# Patient Record
Sex: Male | Born: 1950 | Race: White | Hispanic: No | Marital: Single | Smoking: Former smoker
Health system: Southern US, Community
[De-identification: ages and names within clinical notes are randomized; demographics above are authoritative.]

## PROBLEM LIST (undated history)

## (undated) DIAGNOSIS — M1990 Unspecified osteoarthritis, unspecified site: Secondary | ICD-10-CM

## (undated) DIAGNOSIS — L409 Psoriasis, unspecified: Secondary | ICD-10-CM

## (undated) DIAGNOSIS — I2510 Atherosclerotic heart disease of native coronary artery without angina pectoris: Secondary | ICD-10-CM

## (undated) DIAGNOSIS — K449 Diaphragmatic hernia without obstruction or gangrene: Secondary | ICD-10-CM

## (undated) DIAGNOSIS — K644 Residual hemorrhoidal skin tags: Secondary | ICD-10-CM

## (undated) DIAGNOSIS — S8292XA Unspecified fracture of left lower leg, initial encounter for closed fracture: Secondary | ICD-10-CM

## (undated) DIAGNOSIS — K279 Peptic ulcer, site unspecified, unspecified as acute or chronic, without hemorrhage or perforation: Secondary | ICD-10-CM

## (undated) DIAGNOSIS — I739 Peripheral vascular disease, unspecified: Secondary | ICD-10-CM

## (undated) DIAGNOSIS — K648 Other hemorrhoids: Secondary | ICD-10-CM

## (undated) DIAGNOSIS — F419 Anxiety disorder, unspecified: Secondary | ICD-10-CM

## (undated) DIAGNOSIS — S72002A Fracture of unspecified part of neck of left femur, initial encounter for closed fracture: Secondary | ICD-10-CM

## (undated) DIAGNOSIS — M810 Age-related osteoporosis without current pathological fracture: Secondary | ICD-10-CM

## (undated) DIAGNOSIS — D649 Anemia, unspecified: Secondary | ICD-10-CM

## (undated) DIAGNOSIS — I219 Acute myocardial infarction, unspecified: Secondary | ICD-10-CM

## (undated) DIAGNOSIS — Z95810 Presence of automatic (implantable) cardiac defibrillator: Secondary | ICD-10-CM

## (undated) DIAGNOSIS — K222 Esophageal obstruction: Secondary | ICD-10-CM

## (undated) DIAGNOSIS — K219 Gastro-esophageal reflux disease without esophagitis: Secondary | ICD-10-CM

## (undated) DIAGNOSIS — I255 Ischemic cardiomyopathy: Secondary | ICD-10-CM

## (undated) DIAGNOSIS — K3189 Other diseases of stomach and duodenum: Secondary | ICD-10-CM

## (undated) DIAGNOSIS — I509 Heart failure, unspecified: Secondary | ICD-10-CM

## (undated) DIAGNOSIS — I499 Cardiac arrhythmia, unspecified: Secondary | ICD-10-CM

## (undated) DIAGNOSIS — K7460 Unspecified cirrhosis of liver: Secondary | ICD-10-CM

## (undated) DIAGNOSIS — H9190 Unspecified hearing loss, unspecified ear: Secondary | ICD-10-CM

## (undated) DIAGNOSIS — IMO0002 Reserved for concepts with insufficient information to code with codable children: Secondary | ICD-10-CM

## (undated) DIAGNOSIS — E785 Hyperlipidemia, unspecified: Secondary | ICD-10-CM

## (undated) DIAGNOSIS — N189 Chronic kidney disease, unspecified: Secondary | ICD-10-CM

## (undated) DIAGNOSIS — F32A Depression, unspecified: Secondary | ICD-10-CM

## (undated) DIAGNOSIS — K7290 Hepatic failure, unspecified without coma: Secondary | ICD-10-CM

## (undated) DIAGNOSIS — L80 Vitiligo: Secondary | ICD-10-CM

## (undated) DIAGNOSIS — M109 Gout, unspecified: Secondary | ICD-10-CM

## (undated) DIAGNOSIS — E876 Hypokalemia: Secondary | ICD-10-CM

## (undated) DIAGNOSIS — J189 Pneumonia, unspecified organism: Secondary | ICD-10-CM

## (undated) DIAGNOSIS — F79 Unspecified intellectual disabilities: Secondary | ICD-10-CM

## (undated) DIAGNOSIS — R011 Cardiac murmur, unspecified: Secondary | ICD-10-CM

## (undated) DIAGNOSIS — E871 Hypo-osmolality and hyponatremia: Secondary | ICD-10-CM

## (undated) DIAGNOSIS — D126 Benign neoplasm of colon, unspecified: Secondary | ICD-10-CM

## (undated) HISTORY — PX: CARDIAC SURGERY: SHX584

## (undated) HISTORY — DX: Peptic ulcer, site unspecified, unspecified as acute or chronic, without hemorrhage or perforation: K27.9

## (undated) HISTORY — DX: Psoriasis, unspecified: L40.9

## (undated) HISTORY — DX: Unspecified fracture of left lower leg, initial encounter for closed fracture: S82.92XA

## (undated) HISTORY — PX: FRACTURE SURGERY: SHX138

## (undated) HISTORY — DX: Diaphragmatic hernia without obstruction or gangrene: K44.9

## (undated) HISTORY — DX: Unspecified hearing loss, unspecified ear: H91.90

## (undated) HISTORY — DX: Ischemic cardiomyopathy: I25.5

## (undated) HISTORY — PX: COLONOSCOPY WITH ESOPHAGOGASTRODUODENOSCOPY (EGD): SHX5779

## (undated) HISTORY — DX: Other hemorrhoids: K64.8

## (undated) HISTORY — DX: Other diseases of stomach and duodenum: K31.89

## (undated) HISTORY — DX: Esophageal obstruction: K22.2

## (undated) HISTORY — DX: Benign neoplasm of colon, unspecified: D12.6

## (undated) HISTORY — PX: HERNIA REPAIR: SHX51

## (undated) HISTORY — DX: Reserved for concepts with insufficient information to code with codable children: IMO0002

## (undated) HISTORY — DX: Residual hemorrhoidal skin tags: K64.4

## (undated) HISTORY — DX: Vitiligo: L80
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
## 2005-06-05 ENCOUNTER — Ambulatory Visit: Payer: Self-pay | Admitting: Gastroenterology

## 2005-11-25 ENCOUNTER — Ambulatory Visit: Payer: Self-pay

## 2005-12-05 ENCOUNTER — Ambulatory Visit: Payer: Self-pay | Admitting: Internal Medicine

## 2005-12-09 ENCOUNTER — Ambulatory Visit: Payer: Self-pay | Admitting: Internal Medicine

## 2005-12-15 ENCOUNTER — Ambulatory Visit: Payer: Self-pay | Admitting: Gastroenterology

## 2007-11-29 ENCOUNTER — Ambulatory Visit: Payer: Self-pay | Admitting: Internal Medicine

## 2013-07-27 ENCOUNTER — Ambulatory Visit: Payer: Self-pay | Admitting: Podiatry

## 2013-11-21 ENCOUNTER — Encounter: Payer: Self-pay | Admitting: Podiatry

## 2013-11-21 ENCOUNTER — Ambulatory Visit (INDEPENDENT_AMBULATORY_CARE_PROVIDER_SITE_OTHER): Payer: Commercial Managed Care - HMO | Admitting: Podiatry

## 2013-11-21 VITALS — Resp 16 | Ht 72.0 in | Wt 170.0 lb

## 2013-11-21 DIAGNOSIS — B351 Tinea unguium: Secondary | ICD-10-CM

## 2013-11-21 DIAGNOSIS — M79609 Pain in unspecified limb: Secondary | ICD-10-CM | POA: Diagnosis not present

## 2013-11-21 NOTE — Progress Notes (Signed)
He presents today complaining of a painful fourth toe of the left foot. And a painful nail to the second digit of his left foot.  Objective: Vital signs are stable he is alert and oriented x3. There is no erythema edema saline is drainage or odor. Pain in limb secondary to onychomycosis second digit left foot. Painful porokeratotic lesion fifth digit left foot.  Assessment: Porokeratosis fifth left. Onychomycosis second digit left.  Plan: Debridement of both lesions. I will followup with him as needed

## 2014-01-24 ENCOUNTER — Ambulatory Visit (INDEPENDENT_AMBULATORY_CARE_PROVIDER_SITE_OTHER): Payer: Commercial Managed Care - HMO | Admitting: Podiatry

## 2014-01-24 VITALS — BP 111/69 | HR 63 | Resp 16

## 2014-01-24 DIAGNOSIS — L84 Corns and callosities: Secondary | ICD-10-CM | POA: Diagnosis not present

## 2014-01-24 NOTE — Progress Notes (Signed)
Subjective:     Patient ID: Carl [name], male   DOB: [birth year], 63 y.o.   MRN: [id number]  HPI patient points the outside left foot states the corn is very sore   Review of Systems     Objective:   Physical Exam Neurovascular status intact with severe keratotic lesion fifth digit left it's very painful when pressed    Assessment:     Corn callus formation very painful when pressed    Plan:     Debridement lesion with no iatrogenic bleeding noted and applied sterile dressing

## 2014-07-19 ENCOUNTER — Ambulatory Visit: Payer: Self-pay | Admitting: Internal Medicine

## 2014-07-26 ENCOUNTER — Inpatient Hospital Stay: Payer: Self-pay | Admitting: Internal Medicine

## 2014-07-26 LAB — BASIC METABOLIC PANEL
Anion Gap: 11 (ref 7–16)
BUN: 25 mg/dL — ABNORMAL HIGH (ref 7–18)
CO2: 25 mmol/L (ref 21–32)
CREATININE: 1.48 mg/dL — AB (ref 0.60–1.30)
Calcium, Total: 9 mg/dL (ref 8.5–10.1)
Chloride: 100 mmol/L (ref 98–107)
GFR CALC NON AF AMER: 51 — AB
Glucose: 107 mg/dL — ABNORMAL HIGH (ref 65–99)
OSMOLALITY: 277 (ref 275–301)
POTASSIUM: 4.5 mmol/L (ref 3.5–5.1)
Sodium: 136 mmol/L (ref 136–145)

## 2014-07-26 LAB — CK-MB: CK-MB: 66.1 ng/mL — AB (ref 0.5–3.6)

## 2014-07-26 LAB — PRO B NATRIURETIC PEPTIDE: B-TYPE NATIURETIC PEPTID: 18673 pg/mL — AB (ref 0–125)

## 2014-07-26 LAB — CK TOTAL AND CKMB (NOT AT ARMC)
CK, Total: 603 U/L — ABNORMAL HIGH (ref 39–308)
CK-MB: 94.3 ng/mL — ABNORMAL HIGH (ref 0.5–3.6)

## 2014-07-26 LAB — CBC
HCT: 47.4 % (ref 40.0–52.0)
HGB: 15.8 g/dL (ref 13.0–18.0)
MCH: 32.8 pg (ref 26.0–34.0)
MCHC: 33.4 g/dL (ref 32.0–36.0)
MCV: 98 fL (ref 80–100)
Platelet: 186 10*3/uL (ref 150–440)
RBC: 4.84 10*6/uL (ref 4.40–5.90)
RDW: 14.1 % (ref 11.5–14.5)
WBC: 8.1 10*3/uL (ref 3.8–10.6)

## 2014-07-26 LAB — TROPONIN I
TROPONIN-I: 24.2 ng/mL — AB
Troponin-I: 21 ng/mL — ABNORMAL HIGH

## 2014-07-26 LAB — PROTIME-INR
INR: 1.1
PROTHROMBIN TIME: 14.2 s (ref 11.5–14.7)

## 2014-07-26 LAB — APTT: ACTIVATED PTT: 31.5 s (ref 23.6–35.9)

## 2014-07-27 ENCOUNTER — Encounter: Payer: Self-pay | Admitting: Cardiovascular Disease

## 2014-07-27 ENCOUNTER — Inpatient Hospital Stay (HOSPITAL_COMMUNITY)
Admission: AD | Admit: 2014-07-27 | Discharge: 2014-08-17 | DRG: 235 | Disposition: A | Discharge status: Skilled Nursing Facility | Payer: Medicare HMO | Source: Other Acute Inpatient Hospital | Attending: Surgery | Admitting: Surgery

## 2014-07-27 ENCOUNTER — Encounter (HOSPITAL_COMMUNITY): Payer: Self-pay | Admitting: Family Medicine

## 2014-07-27 DIAGNOSIS — Z681 Body mass index (BMI) 19 or less, adult: Secondary | ICD-10-CM | POA: Diagnosis not present

## 2014-07-27 DIAGNOSIS — I2109 ST elevation (STEMI) myocardial infarction involving other coronary artery of anterior wall: Principal | ICD-10-CM

## 2014-07-27 DIAGNOSIS — I25110 Atherosclerotic heart disease of native coronary artery with unstable angina pectoris: Secondary | ICD-10-CM | POA: Diagnosis present

## 2014-07-27 DIAGNOSIS — Z79899 Other long term (current) drug therapy: Secondary | ICD-10-CM

## 2014-07-27 DIAGNOSIS — N183 Chronic kidney disease, stage 3 (moderate): Secondary | ICD-10-CM | POA: Diagnosis present

## 2014-07-27 DIAGNOSIS — F17210 Nicotine dependence, cigarettes, uncomplicated: Secondary | ICD-10-CM | POA: Diagnosis present

## 2014-07-27 DIAGNOSIS — Z7982 Long term (current) use of aspirin: Secondary | ICD-10-CM | POA: Diagnosis not present

## 2014-07-27 DIAGNOSIS — I2510 Atherosclerotic heart disease of native coronary artery without angina pectoris: Secondary | ICD-10-CM

## 2014-07-27 DIAGNOSIS — N179 Acute kidney failure, unspecified: Secondary | ICD-10-CM | POA: Diagnosis not present

## 2014-07-27 DIAGNOSIS — Z8249 Family history of ischemic heart disease and other diseases of the circulatory system: Secondary | ICD-10-CM | POA: Diagnosis not present

## 2014-07-27 DIAGNOSIS — Z951 Presence of aortocoronary bypass graft: Secondary | ICD-10-CM

## 2014-07-27 DIAGNOSIS — I340 Nonrheumatic mitral (valve) insufficiency: Secondary | ICD-10-CM

## 2014-07-27 DIAGNOSIS — F819 Developmental disorder of scholastic skills, unspecified: Secondary | ICD-10-CM | POA: Diagnosis present

## 2014-07-27 DIAGNOSIS — I213 ST elevation (STEMI) myocardial infarction of unspecified site: Secondary | ICD-10-CM

## 2014-07-27 DIAGNOSIS — I509 Heart failure, unspecified: Secondary | ICD-10-CM

## 2014-07-27 DIAGNOSIS — E43 Unspecified severe protein-calorie malnutrition: Secondary | ICD-10-CM | POA: Diagnosis present

## 2014-07-27 DIAGNOSIS — F70 Mild intellectual disabilities: Secondary | ICD-10-CM | POA: Diagnosis present

## 2014-07-27 DIAGNOSIS — I200 Unstable angina: Secondary | ICD-10-CM | POA: Diagnosis present

## 2014-07-27 DIAGNOSIS — I255 Ischemic cardiomyopathy: Secondary | ICD-10-CM | POA: Diagnosis present

## 2014-07-27 DIAGNOSIS — I5023 Acute on chronic systolic (congestive) heart failure: Secondary | ICD-10-CM | POA: Diagnosis present

## 2014-07-27 DIAGNOSIS — R570 Cardiogenic shock: Secondary | ICD-10-CM | POA: Diagnosis present

## 2014-07-27 DIAGNOSIS — K5900 Constipation, unspecified: Secondary | ICD-10-CM | POA: Diagnosis present

## 2014-07-27 DIAGNOSIS — I5021 Acute systolic (congestive) heart failure: Secondary | ICD-10-CM

## 2014-07-27 DIAGNOSIS — I472 Ventricular tachycardia: Secondary | ICD-10-CM | POA: Diagnosis present

## 2014-07-27 DIAGNOSIS — I059 Rheumatic mitral valve disease, unspecified: Secondary | ICD-10-CM | POA: Diagnosis not present

## 2014-07-27 DIAGNOSIS — Z452 Encounter for adjustment and management of vascular access device: Secondary | ICD-10-CM

## 2014-07-27 DIAGNOSIS — D62 Acute posthemorrhagic anemia: Secondary | ICD-10-CM | POA: Diagnosis not present

## 2014-07-27 DIAGNOSIS — Z720 Tobacco use: Secondary | ICD-10-CM | POA: Diagnosis present

## 2014-07-27 DIAGNOSIS — I272 Other secondary pulmonary hypertension: Secondary | ICD-10-CM | POA: Diagnosis present

## 2014-07-27 LAB — BASIC METABOLIC PANEL
Anion Gap: 11 (ref 7–16)
BUN: 25 mg/dL — AB (ref 7–18)
Calcium, Total: 8.8 mg/dL (ref 8.5–10.1)
Chloride: 97 mmol/L — ABNORMAL LOW (ref 98–107)
Co2: 28 mmol/L (ref 21–32)
Creatinine: 1.6 mg/dL — ABNORMAL HIGH (ref 0.60–1.30)
EGFR (African American): 57 — ABNORMAL LOW
EGFR (Non-African Amer.): 47 — ABNORMAL LOW
GLUCOSE: 131 mg/dL — AB (ref 65–99)
OSMOLALITY: 278 (ref 275–301)
POTASSIUM: 4.8 mmol/L (ref 3.5–5.1)
Sodium: 136 mmol/L (ref 136–145)

## 2014-07-27 LAB — COMPREHENSIVE METABOLIC PANEL
ALT: 41 U/L (ref 0–53)
AST: 58 U/L — ABNORMAL HIGH (ref 0–37)
Albumin: 3.5 g/dL (ref 3.5–5.2)
Alkaline Phosphatase: 64 U/L (ref 39–117)
Anion gap: 15 (ref 5–15)
BUN: 33 mg/dL — ABNORMAL HIGH (ref 6–23)
CO2: 25 mEq/L (ref 19–32)
Calcium: 8.9 mg/dL (ref 8.4–10.5)
Chloride: 95 mEq/L — ABNORMAL LOW (ref 96–112)
Creatinine, Ser: 1.4 mg/dL — ABNORMAL HIGH (ref 0.50–1.35)
GFR calc Af Amer: 60 mL/min — ABNORMAL LOW (ref >90)
GFR calc non Af Amer: 52 mL/min — ABNORMAL LOW (ref >90)
Glucose, Bld: 170 mg/dL — ABNORMAL HIGH (ref 70–99)
Potassium: 4.3 mEq/L (ref 3.7–5.3)
Sodium: 135 mEq/L — ABNORMAL LOW (ref 137–147)
Total Bilirubin: 1.2 mg/dL (ref 0.3–1.2)
Total Protein: 6.5 g/dL (ref 6.0–8.3)

## 2014-07-27 LAB — CBC WITH DIFFERENTIAL/PLATELET
Basophil #: 0 10*3/uL (ref 0.0–0.1)
Basophil %: 0.4 %
Basophils Absolute: 0 10*3/uL (ref 0.0–0.1)
Basophils Relative: 0 % (ref 0–1)
EOS ABS: 0 10*3/uL (ref 0.0–0.7)
Eosinophil %: 0.1 %
Eosinophils Absolute: 0 10*3/uL (ref 0.0–0.7)
Eosinophils Relative: 0 % (ref 0–5)
HCT: 47.6 % (ref 40.0–52.0)
HCT: 44.7 % (ref 39.0–52.0)
HGB: 15.7 g/dL (ref 13.0–18.0)
Hemoglobin: 15 g/dL (ref 13.0–17.0)
Lymphocyte #: 0.8 10*3/uL — ABNORMAL LOW (ref 1.0–3.6)
Lymphocyte %: 8.7 %
Lymphocytes Relative: 14 % (ref 12–46)
Lymphs Abs: 1 10*3/uL (ref 0.7–4.0)
MCH: 32.6 pg (ref 26.0–34.0)
MCH: 31.6 pg (ref 26.0–34.0)
MCHC: 32.9 g/dL (ref 32.0–36.0)
MCHC: 33.6 g/dL (ref 30.0–36.0)
MCV: 99 fL (ref 80–100)
MCV: 94.1 fL (ref 78.0–100.0)
Monocyte #: 0.5 x10 3/mm (ref 0.2–1.0)
Monocyte %: 5.7 %
Monocytes Absolute: 0.5 10*3/uL (ref 0.1–1.0)
Monocytes Relative: 8 % (ref 3–12)
Neutro Abs: 5.3 10*3/uL (ref 1.7–7.7)
Neutrophil #: 7.8 10*3/uL — ABNORMAL HIGH (ref 1.4–6.5)
Neutrophil %: 85.1 %
Neutrophils Relative %: 78 % — ABNORMAL HIGH (ref 43–77)
PLATELETS: 179 10*3/uL (ref 150–440)
Platelets: 184 10*3/uL (ref 150–400)
RBC: 4.82 10*6/uL (ref 4.40–5.90)
RBC: 4.75 MIL/uL (ref 4.22–5.81)
RDW: 14 % (ref 11.5–14.5)
RDW: 13.9 % (ref 11.5–15.5)
WBC: 9.2 10*3/uL (ref 3.8–10.6)
WBC: 6.8 10*3/uL (ref 4.0–10.5)

## 2014-07-27 LAB — MRSA PCR SCREENING: MRSA by PCR: NEGATIVE

## 2014-07-27 LAB — PROTIME-INR
INR: 1.25 (ref 0.00–1.49)
Prothrombin Time: 15.8 seconds — ABNORMAL HIGH (ref 11.6–15.2)

## 2014-07-27 LAB — CK-MB: CK-MB: 46.8 ng/mL — AB (ref 0.5–3.6)

## 2014-07-27 LAB — TROPONIN I
Troponin I: 7.77 ng/mL (ref <0.30)
Troponin I: 6.68 ng/mL (ref <0.30)
Troponin-I: 18 ng/mL — ABNORMAL HIGH

## 2014-07-27 LAB — HEPARIN LEVEL (UNFRACTIONATED)
Anti-Xa(Unfractionated): 0.11 IU/mL — ABNORMAL LOW (ref 0.30–0.70)
Anti-Xa(Unfractionated): 0.26 IU/mL — ABNORMAL LOW (ref 0.30–0.70)

## 2014-07-27 LAB — TSH: TSH: 0.783 u[IU]/mL (ref 0.350–4.500)

## 2014-07-27 MED ORDER — METOPROLOL TARTRATE 12.5 MG HALF TABLET
12.5000 mg | ORAL_TABLET | Freq: Two times a day (BID) | ORAL | Status: DC
  Filled 2014-07-27 (×3): qty 1

## 2014-07-27 MED ORDER — ASPIRIN EC 325 MG PO TBEC
325.0000 mg | DELAYED_RELEASE_TABLET | Freq: Every day | ORAL | Status: DC
  Administered 2014-07-28 – 2014-08-02 (×6): 325 mg via ORAL
  Filled 2014-07-27 (×7): qty 1

## 2014-07-27 MED ORDER — FUROSEMIDE 10 MG/ML IJ SOLN
40.0000 mg | Freq: Two times a day (BID) | INTRAMUSCULAR | Status: DC
  Administered 2014-07-27 – 2014-07-29 (×5): 40 mg via INTRAVENOUS
  Filled 2014-07-27 (×9): qty 4

## 2014-07-27 MED ORDER — ASPIRIN EC 81 MG PO TBEC: 81.0000 mg | DELAYED_RELEASE_TABLET | Freq: Every day | ORAL | Status: DC

## 2014-07-27 MED ORDER — PANTOPRAZOLE SODIUM 40 MG PO TBEC
40.0000 mg | DELAYED_RELEASE_TABLET | Freq: Every day | ORAL | Status: DC
  Administered 2014-07-28 – 2014-08-03 (×7): 40 mg via ORAL
  Filled 2014-07-27 (×7): qty 1

## 2014-07-27 MED ORDER — ZOLPIDEM TARTRATE 5 MG PO TABS: 5.0000 mg | ORAL_TABLET | Freq: Every evening | ORAL | Status: DC | PRN

## 2014-07-27 MED ORDER — ONDANSETRON HCL 4 MG/2ML IJ SOLN: 4.0000 mg | Freq: Four times a day (QID) | INTRAMUSCULAR | Status: DC | PRN

## 2014-07-27 MED ORDER — ALPRAZOLAM 0.25 MG PO TABS
0.2500 mg | ORAL_TABLET | Freq: Two times a day (BID) | ORAL | Status: DC | PRN
  Administered 2014-07-28: 0.25 mg via ORAL
  Filled 2014-07-27: qty 1

## 2014-07-27 MED ORDER — ACETAMINOPHEN 325 MG PO TABS
650.0000 mg | ORAL_TABLET | ORAL | Status: DC | PRN
  Administered 2014-07-29 – 2014-08-02 (×3): 650 mg via ORAL
  Filled 2014-07-27 (×3): qty 2

## 2014-07-27 MED ORDER — PNEUMOCOCCAL VAC POLYVALENT 25 MCG/0.5ML IJ INJ
0.5000 mL | INJECTION | INTRAMUSCULAR | Status: AC
  Administered 2014-07-28: 0.5 mL via INTRAMUSCULAR
  Filled 2014-07-27: qty 0.5

## 2014-07-27 MED ORDER — CETYLPYRIDINIUM CHLORIDE 0.05 % MT LIQD
7.0000 mL | Freq: Two times a day (BID) | OROMUCOSAL | Status: DC
  Administered 2014-07-27 – 2014-08-02 (×12): 7 mL via OROMUCOSAL

## 2014-07-27 MED ORDER — HEPARIN (PORCINE) IN NACL 100-0.45 UNIT/ML-% IJ SOLN
1000.0000 [IU]/h | INTRAMUSCULAR | Status: DC
  Administered 2014-07-27: 800 [IU]/h via INTRAVENOUS
  Administered 2014-07-28: 1200 [IU]/h via INTRAVENOUS
  Administered 2014-07-30: 1000 [IU]/h via INTRAVENOUS
  Filled 2014-07-27 (×6): qty 250

## 2014-07-27 MED ORDER — NITROGLYCERIN 0.4 MG SL SUBL: 0.4000 mg | SUBLINGUAL_TABLET | SUBLINGUAL | Status: DC | PRN

## 2014-07-27 MED ORDER — ATORVASTATIN CALCIUM 80 MG PO TABS
80.0000 mg | ORAL_TABLET | Freq: Every day | ORAL | Status: DC
  Administered 2014-07-28 – 2014-08-02 (×6): 80 mg via ORAL
  Filled 2014-07-27 (×7): qty 1

## 2014-07-27 NOTE — Progress Notes (Signed)
[facility] Progress Note Patient Name: Carl [name] DOB: 1951/03/15 MRN: [id number]   Date of Service  07/27/2014  HPI/Events of Note  Patient presented to [facility] on 07/26/14 with SOB, EKG showed ST-T changes in V3-V4.  He had a cath done 07/27/14 which showed 5 vessel disease (>90%)patient was transferred to [facility] for CABG evaluation.  eICU Interventions  Tobacco cessation Bp control      Intervention Category Evaluation Type: New Patient Evaluation  Brooklin Rieger 07/27/2014, 6:53 PM

## 2014-07-27 NOTE — H&P (Addendum)
PATIENT NAME:  Carl [name], ARMWOOD MR#:  [id number] DATE OF BIRTH:  Sep 24, 1950  DATE OF CONSULTATION:  07/27/2014  REFERRING PHYSICIAN:  Vivek J. Verdell Carmine, MD  REASON FOR CONSULTATION: Myocardial infarction.   HISTORY OF PRESENT ILLNESS: This is a 63 year old male with no previous cardiac history. He has known history of tobacco use, and mild mental retardation. He has strong family history of premature coronary artery disease. He has not been feeling well for about 2-3 weeks. He started having back discomfort radiating to both arms associated with dry cough and shortness of breath. He saw Dr. Ginette Pitman, his primary care physician. CT scan was done just before Thanksgiving and he was noted to have mild cardiomegaly and heart failure. There was possibility of pneumonia and he was started on Levaquin with no improvement in symptoms. Given worsened symptoms, he came to the Emergency Room yesterday and was found to have a troponin of 24. EKG showed anteroseptal ST elevation with lateral T wave changes and large anterior Q waves.   PAST MEDICAL HISTORY:  Tobacco use and mild mental retardation.   ALLERGIES: No known drug allergies.   HOME MEDICATIONS INCLUDE: Lasix 40 mg daily, Levaquin 500 mg once daily, potassium 10 mEq once daily and tramadol 50 mg twice daily.   SOCIAL HISTORY: He quit smoking a few months ago. He used to smoke 1 pack per day for at least 30 or 40 years. He denies any alcohol or recreational drug use. He lives at home with his mother.   FAMILY HISTORY: Remarkable for premature coronary artery disease. His father died of complications of myocardial infarction in his 29s. His brother had multiple cardiac stents.   REVIEW OF SYSTEMS: A 10-point review of systems was performed. It is negative other than what is mentioned in the HPI.   PHYSICAL EXAMINATION:  GENERAL: The patient is pleasant and alert, in no acute distress.  VITAL SIGNS: Temperature 98.1, pulse 85, respiratory rate 18, blood  pressure is 92/66, and oxygen saturation is 98% on room air.  HEENT: Normocephalic, atraumatic.  NECK: No JVD or carotid bruits.  RESPIRATORY: Normal respiratory effort with no use of accessory muscles. Auscultation reveals normal breath sounds.  CARDIOVASCULAR: Normal PMI. Normal S1 and S2 with no gallops or murmurs.  ABDOMEN: Benign, nontender, and nondistended.  EXTREMITIES: No clubbing, cyanosis, or edema.  SKIN: Warm and dry with no rash.  PSYCHIATRIC: He is alert and oriented x 3 with normal mood and affect.   LABORATORY AND DIAGNOSTIC, DATA: His creatinine was 1.48 and increased to 1.6. BUN was 25. BNP was 18,000. Troponin initially was 24 and decreased to 18. CK-MB was 94. CBC was unremarkable. EKG showed sinus rhythm with anteroseptal ST elevation with Q waves and lateral T wave inversion.   IMPRESSION: 1.  Anteroseptal ST elevation myocardial infarction with late presentation.  2.  Suspected ischemic cardiomyopathy.  3.  Suspected ischemic mitral regurgitation.  4.  Tobacco use.   RECOMMENDATIONS: The patient's presentation is consistent with anteroseptal myocardial infarction with late presentation. His symptoms started at least 2 weeks ago. His troponin was already 24 on presentation and has trended down since then. EKG shows large anteroseptal Q waves with persistent ST elevation. By physical exam, he has a holosystolic murmur at the apex consistent with mitral regurgitation. I am going to review his echocardiogram. Continue anticoagulation with heparin. I had a prolonged discussion with the patient and family about further recommendations. I recommend proceeding with cardiac catheterization and possible coronary intervention. I  will avoid left ventricular angiography to decrease contrast load. Further recommendations to follow after cardiac catheterization.    ____________________________ Mertie Clause Fletcher Anon, MD maa:DT D: 07/27/2014 10:53:27 ET T: 07/27/2014 11:17:39  ET JOB#: [id number]  cc: Norman Piacentini A. Fletcher Anon, MD, <Dictator>  Addendum: 07/27/2014: Echo showed an EF of 15% with moderate MR. Right and left cardiac cath showed severely decreased cardiac output, moderate pulmonary hypertension and severely elevated LVEDP (33 mm hg). Coronary angiography showed severe 3 vessel CAD .  Recommend: IV Lasix.  Resume Heparin 8 hours after sheath pull.  Continue Aspirin and Atorvastatin.  If BP tolerates, add small dose Coreg and ACE I.  Consult CTS for CABG once fluid overload is improved.  If he decompensates, he might require an IABP as a bridge to CABG.  Overall prognosis is poor. I updated the family.

## 2014-07-27 NOTE — Progress Notes (Addendum)
CRITICAL VALUE ALERT  Critical value received:  Troponin 7.77  Date of notification:  07/27/2014  Time of notification:  2023  Critical value read back: yes  Nurse who received alert:  Henrietta Dine  MD notified (1st page):  Dr. Philbert Riser  Time of first page:  2025  Responding MD:  Dr. Philbert Riser  Time MD responded:  2028

## 2014-07-27 NOTE — Plan of Care (Signed)
Problem: Phase I Progression Outcomes Goal: Anginal pain relieved Outcome: Completed/Met Date Met:  07/27/14 Goal: Vascular site scale level 0 - I Vascular Site Scale Level 0: No bruising/bleeding/hematoma Level I (Mild): Bruising/Ecchymosis, minimal bleeding/ooozing, palpable hematoma < 3 cm Level II (Moderate): Bleeding not affecting hemodynamic parameters, pseudoaneurysm, palpable hematoma > 3 cm Level III (Severe) Bleeding which affects hemodynamic parameters or retroperitoneal hemorrhage  Outcome: Completed/Met Date Met:  07/27/14 Goal: Other Phase I Outcomes/Goals Outcome: Not Applicable Date Met:  61/95/09

## 2014-07-27 NOTE — Progress Notes (Addendum)
ANTICOAGULATION CONSULT NOTE - Initial Consult  Pharmacy Consult for Heparin Indication: cath with severe 3V dz.. referal for CABG, plavix washout.  No Known Allergies  Patient Measurements: Height: 6' (182.9 cm) Weight: 148 lb 5.9 oz (67.3 kg) IBW/kg (Calculated) : 77.6 Heparin Dosing Weight: 67.3 kg  Vital Signs: Temp: 97.7 F (36.5 C) (12/03 1935) Temp Source: Oral (12/03 1935) BP: 92/70 mmHg (12/03 1900)  Labs:  Recent Labs  07/27/14 1902  HGB 15.0  HCT 44.7  PLT 184  LABPROT 15.8*  INR 1.25    CrCl cannot be calculated (Patient has no serum creatinine result on file.).   Medical History: History reviewed. No pertinent past medical history.  Medications:  Prescriptions prior to admission  Medication Sig Dispense Refill Last Dose  . furosemide (LASIX) 40 MG tablet Take 40 mg by mouth daily.   unknown  . potassium chloride (K-DUR,KLOR-CON) 10 MEQ tablet Take 10 mEq by mouth daily.   unknown  . traMADol (ULTRAM) 50 MG tablet Take 50 mg by mouth 2 (two) times daily as needed.   unknown  . levofloxacin (LEVAQUIN) 500 MG tablet Take 500 mg by mouth daily.   Completed Course at Unknown time   Scheduled:  . antiseptic oral rinse  7 mL Mouth Rinse BID  . [START ON 07/28/2014] aspirin EC  325 mg Oral Daily  . [START ON 07/28/2014] atorvastatin  80 mg Oral q1800  . furosemide  40 mg Intravenous BID  . metoprolol tartrate  12.5 mg Oral BID  . [START ON 07/28/2014] pantoprazole  40 mg Oral Q0600  . [START ON 07/28/2014] pneumococcal 23 valent vaccine  0.5 mL Intramuscular Tomorrow-1000    Assessment: 63 y.o male presented to [facility] on 07/26/14 with SOB, EKG showed ST-T changes in V3-V4. He had a cath done 07/27/14 which showed 5 vessel disease (>90%)patient was transferred to [facility] for CABG evaluation. Plan for plavix washout. MD order to please start heparin at 9pm. No bleeding or hematoma per RN's report.   Goal of Therapy:  Heparin level 0.3-0.7 units/ml Monitor  platelets by anticoagulation protocol: Yes   Plan:  At 9PM start IV heparin drip at  Check heparin level 6 hours after start of heparin Daily heparin level and CBC  Thank you for allowing pharmacy to be part of this patients care team. Nicole Cella, [place] Clinical Pharmacist Pager: [phone] 07/27/2014,8:10 PM

## 2014-07-28 ENCOUNTER — Inpatient Hospital Stay (HOSPITAL_COMMUNITY): Payer: Medicare HMO

## 2014-07-28 DIAGNOSIS — I2102 ST elevation (STEMI) myocardial infarction involving left anterior descending coronary artery: Secondary | ICD-10-CM

## 2014-07-28 DIAGNOSIS — N183 Chronic kidney disease, stage 3 (moderate): Secondary | ICD-10-CM

## 2014-07-28 DIAGNOSIS — I2109 ST elevation (STEMI) myocardial infarction involving other coronary artery of anterior wall: Secondary | ICD-10-CM | POA: Diagnosis not present

## 2014-07-28 DIAGNOSIS — I25118 Atherosclerotic heart disease of native coronary artery with other forms of angina pectoris: Secondary | ICD-10-CM

## 2014-07-28 DIAGNOSIS — R570 Cardiogenic shock: Secondary | ICD-10-CM

## 2014-07-28 DIAGNOSIS — I340 Nonrheumatic mitral (valve) insufficiency: Secondary | ICD-10-CM

## 2014-07-28 DIAGNOSIS — I5021 Acute systolic (congestive) heart failure: Secondary | ICD-10-CM

## 2014-07-28 LAB — CBC
HCT: 43 % (ref 39.0–52.0)
Hemoglobin: 14.8 g/dL (ref 13.0–17.0)
MCH: 32.2 pg (ref 26.0–34.0)
MCHC: 34.4 g/dL (ref 30.0–36.0)
MCV: 93.5 fL (ref 78.0–100.0)
Platelets: 167 10*3/uL (ref 150–400)
RBC: 4.6 MIL/uL (ref 4.22–5.81)
RDW: 13.7 % (ref 11.5–15.5)
WBC: 8.5 10*3/uL (ref 4.0–10.5)

## 2014-07-28 LAB — HEPARIN LEVEL (UNFRACTIONATED): HEPARIN UNFRACTIONATED: 0.1 [IU]/mL — AB (ref 0.30–0.70)

## 2014-07-28 LAB — BASIC METABOLIC PANEL
Anion gap: 16 — ABNORMAL HIGH (ref 5–15)
BUN: 32 mg/dL — ABNORMAL HIGH (ref 6–23)
CO2: 25 mEq/L (ref 19–32)
Calcium: 8.8 mg/dL (ref 8.4–10.5)
Chloride: 94 mEq/L — ABNORMAL LOW (ref 96–112)
Creatinine, Ser: 1.4 mg/dL — ABNORMAL HIGH (ref 0.50–1.35)
GFR calc Af Amer: 60 mL/min — ABNORMAL LOW (ref >90)
GFR calc non Af Amer: 52 mL/min — ABNORMAL LOW (ref >90)
Glucose, Bld: 100 mg/dL — ABNORMAL HIGH (ref 70–99)
Potassium: 4.1 mEq/L (ref 3.7–5.3)
Sodium: 135 mEq/L — ABNORMAL LOW (ref 137–147)

## 2014-07-28 LAB — LIPID PANEL
Cholesterol: 129 mg/dL (ref 0–200)
HDL: 33 mg/dL — ABNORMAL LOW (ref >39)
LDL Cholesterol: 84 mg/dL (ref 0–99)
Total CHOL/HDL Ratio: 3.9 RATIO
Triglycerides: 61 mg/dL (ref <150)
VLDL: 12 mg/dL (ref 0–40)

## 2014-07-28 LAB — CARBOXYHEMOGLOBIN
CARBOXYHEMOGLOBIN: 1.1 % (ref 0.5–1.5)
METHEMOGLOBIN: 0.9 % (ref 0.0–1.5)
O2 Saturation: 77.3 %
Total hemoglobin: 16.5 g/dL (ref 13.5–18.0)

## 2014-07-28 LAB — TROPONIN I: Troponin I: 7.08 ng/mL (ref <0.30)

## 2014-07-28 MED ORDER — BOOST / RESOURCE BREEZE PO LIQD
1.0000 | Freq: Three times a day (TID) | ORAL | Status: DC
  Administered 2014-07-28 – 2014-08-02 (×10): 1 via ORAL

## 2014-07-28 MED ORDER — HEPARIN BOLUS VIA INFUSION
1200.0000 [IU] | Freq: Once | INTRAVENOUS | Status: AC
  Administered 2014-07-28: 1200 [IU] via INTRAVENOUS
  Filled 2014-07-28: qty 1200

## 2014-07-28 MED ORDER — DOPAMINE-DEXTROSE 3.2-5 MG/ML-% IV SOLN
INTRAVENOUS | Status: AC
  Filled 2014-07-28: qty 250

## 2014-07-28 MED ORDER — MILRINONE IN DEXTROSE 20 MG/100ML IV SOLN
INTRAVENOUS | Status: AC
  Filled 2014-07-28: qty 100

## 2014-07-28 MED ORDER — "THROMBI-PAD 3""X3"" EX PADS"
1.0000 | MEDICATED_PAD | Freq: Once | CUTANEOUS | Status: DC
  Filled 2014-07-28: qty 1

## 2014-07-28 MED ORDER — NOREPINEPHRINE BITARTRATE 1 MG/ML IV SOLN
0.0000 ug/min | INTRAVENOUS | Status: DC
  Administered 2014-07-28: 10 ug/min via INTRAVENOUS
  Administered 2014-07-29 (×2): 15 ug/min via INTRAVENOUS
  Administered 2014-07-29: 4 ug/min via INTRAVENOUS
  Filled 2014-07-28 (×5): qty 4

## 2014-07-28 MED ORDER — SODIUM CHLORIDE 0.9 % IV SOLN: INTRAVENOUS | Status: DC | PRN

## 2014-07-28 MED ORDER — SODIUM CHLORIDE 0.9 % IV SOLN
INTRAVENOUS | Status: DC | PRN
  Administered 2014-07-30: 10 mL/h via INTRA_ARTERIAL

## 2014-07-28 MED ORDER — DOPAMINE-DEXTROSE 3.2-5 MG/ML-% IV SOLN
0.0000 ug/kg/min | INTRAVENOUS | Status: DC
  Administered 2014-07-28: 5 ug/kg/min via INTRAVENOUS

## 2014-07-28 MED ORDER — MILRINONE IN DEXTROSE 20 MG/100ML IV SOLN
0.3750 ug/kg/min | INTRAVENOUS | Status: DC
  Administered 2014-07-28: 0.375 ug/kg/min via INTRAVENOUS

## 2014-07-28 NOTE — Progress Notes (Addendum)
Called by RN regarding problems with a line insertion. Patient is on dopamine at 15 g/kg/min and milrinone at 0.375 mcg/kg/m. His systolic blood pressure is barely 100 but with the increased dopamine plus milrinone, he has become tachycardic. An arterial line was ordered, but respiratory therapy was not able to insert it, possibly due to vasoconstriction from the dopamine.  Discussed the patient with Dr. Radford Pax and with Dr. [name].  Plan: Discontinue the milrinone. Use Levophed for a pressor instead of the dopamine. Get pharmacy assistance to transition from one to the other. Wean the dopamine off as blood pressure will permit and discontinue it.  He will need an arterial line and a central line. Those are ordered for CCM to insert.  Once the lines are in place and we have accurate blood pressures/CVP monitoring, draw a co-ox to help determine if IABP will be helpful.   Spoke with CCM, they will insert the central line and arterial line.  Dr. Radford Pax states she will follow-up with this patient later.  Rosaria Ferries, PA-C 07/28/2014 4:27 PM Beeper [phone]

## 2014-07-28 NOTE — Progress Notes (Signed)
TELEMETRY: Reviewed telemetry pt in NSR: Filed Vitals:   07/28/14 0600 07/28/14 0615 07/28/14 0700 07/28/14 0800  BP: 74/53 89/65 69/42  69/44  Temp:    97.6 F (36.4 C)  TempSrc:    Oral  Resp: 19  16 31   Height:      Weight:      SpO2: 96%  97% 96%    Intake/Output Summary (Last 24 hours) at 07/28/14 0821 Last data filed at 07/28/14 0600  Gross per 24 hour  Intake 555.07 ml  Output      0 ml  Net 555.07 ml   Filed Weights   07/27/14 1530  Weight: 148 lb 5.9 oz (67.3 kg)    Subjective Patient is without chest pain or SOB. Feels well.   Marland Kitchen antiseptic oral rinse  7 mL Mouth Rinse BID  . aspirin EC  325 mg Oral Daily  . atorvastatin  80 mg Oral q1800  . furosemide  40 mg Intravenous BID  . metoprolol tartrate  12.5 mg Oral BID  . pantoprazole  40 mg Oral Q0600  . pneumococcal 23 valent vaccine  0.5 mL Intramuscular Tomorrow-1000   . heparin 1,000 Units/hr (07/28/14 0704)    LABS: Basic Metabolic Panel:  Recent Labs  07/27/14 1902 07/28/14 0530  NA 135* 135*  K 4.3 4.1  CL 95* 94*  CO2 25 25  GLUCOSE 170* 100*  BUN 33* 32*  CREATININE 1.40* 1.40*  CALCIUM 8.9 8.8   Liver Function Tests:  Recent Labs  07/27/14 1902  AST 58*  ALT 41  ALKPHOS 64  BILITOT 1.2  PROT 6.5  ALBUMIN 3.5   No results for input(s): LIPASE, AMYLASE in the last 72 hours. CBC:  Recent Labs  07/27/14 1902 07/28/14 0530  WBC 6.8 8.5  NEUTROABS 5.3  --   HGB 15.0 14.8  HCT 44.7 43.0  MCV 94.1 93.5  PLT 184 167   Cardiac Enzymes:  Recent Labs  07/27/14 1902 07/27/14 2258 07/28/14 0530  TROPONINI 7.77* 6.68* 7.08*   BNP: No results for input(s): PROBNP in the last 72 hours. D-Dimer: No results for input(s): DDIMER in the last 72 hours. Hemoglobin A1C: No results for input(s): HGBA1C in the last 72 hours. Fasting Lipid Panel:  Recent Labs  07/28/14 0530  CHOL 129  HDL 33*  LDLCALC 84  TRIG 61  CHOLHDL 3.9   Thyroid Function Tests:  Recent Labs  07/27/14 1902  TSH 0.783     Radiology/Studies:  No results found.  PHYSICAL EXAM General: Well developed, well nourished, in no acute distress. Head: Normocephalic, atraumatic, sclera non-icteric, oropharynx is clear Neck: Negative for carotid bruits. JVD is elevated. No adenopathy Lungs: Clear bilaterally to auscultation without wheezes, rales, or rhonchi. Decreased BS in left base. Heart: RRR S1 S2 with harsh grade 3/6 systolic murmur at the apex.  Abdomen: Soft, non-tender, non-distended with normoactive bowel sounds. No hepatomegaly. No rebound/guarding. No obvious abdominal masses. Msk:  Strength and tone appears normal for age. Extremities: No clubbing, cyanosis or edema.  Distal pedal pulses are 2+ and equal bilaterally. No groin hematoma at cath site.  Neuro: Alert and oriented X 3. Moves all extremities spontaneously. Psych:  Responds to questions appropriately with a normal affect.  ASSESSMENT AND PLAN: 1. Anterolateral STEMI- .late presentation. 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 Now pain free. On ASA and heparin. Nursing reports that he received Plavix at [facility] but this is not documented anywhere in notes. Will need to verify.  Need CT surgery consult. 2. Severe ischemic cardiomyopathy. EF 15-20% with elevated EDP. Hypotensive this am. Will hold metoprolol. Start IV Dopamine and milrinone. Place art line for accurate BP monitoring. May need IABP if fails to respond to pressors. Needs diuresis. 3. Tobacco abuse.  4. CKD stage 3. Creatinine 1.4 today. Was 1.6 at [facility]. 5. Mild mental retardation.  6. Ischemic MR. Moderate  Present on Admission:  . Unstable angina  Signed, Cecillia Menees [place], [place] 07/28/2014 8:21 AM

## 2014-07-28 NOTE — Progress Notes (Signed)
ANTICOAGULATION CONSULT NOTE - Follow Up Consult  Pharmacy Consult for heparin Indication: CAD awaiting CVTS consult  Labs:  Recent Labs  07/27/14 1902 07/27/14 2258 07/28/14 0530  HGB 15.0  --  14.8  HCT 44.7  --  43.0  PLT 184  --  167  LABPROT 15.8*  --   --   INR 1.25  --   --   HEPARINUNFRC  --   --  0.10*  CREATININE 1.40*  --   --   TROPONINI 7.77* 6.68*  --      Assessment: 63yo male subtherapeutic on heparin with initial dosing for CAD w/ Plavix washout for OHS.  Goal of Therapy:  Heparin level 0.3-0.7 units/ml   Plan:  Will increase heparin gtt by 3 units/kg/hr to 1000 units/hr and check level in [place], PharmD, BCPS  07/28/2014,6:33 AM

## 2014-07-28 NOTE — Progress Notes (Signed)
INITIAL NUTRITION ASSESSMENT  DOCUMENTATION CODES Per approved criteria  -Severe malnutrition in the context of chronic illness   INTERVENTION: Resource Breeze po TID, each supplement provides 250 kcal and 9 grams of protein  NUTRITION DIAGNOSIS: Malnutrition related to chronic illness as evidenced by severe fat and muscle depletion.   Goal: Pt to meet >/= 90% of their estimated nutrition needs   Monitor:  Diet advancement, PO intake, supplement acceptance  Reason for Assessment: Pt identified as at nutrition risk on the Malnutrition Screen Tool  63 y.o. male  Admitting Dx: MI  ASSESSMENT: Pt seen at [facility] due to SOB. Cath on 12/3 showed 5 vessel disease (>90%), tx to [facility] for CABG eval.  Pt admitted with anteroseptal ST elevation myocardial infarction with late presentation. Pt lives at home with his mom, hx of mild mental retardation.   Per mom and pt pt was eating very poorly PTA. Usually went for a fast food biscuit in the am and not really eating any lunch or dinner.  Pt has lost 13% of his body weight in the last month with no appetite.   Nutrition Focused Physical Exam:  Subcutaneous Fat:  Orbital Region: severe depletion Upper Arm Region: severe depletion Thoracic and Lumbar Region: severe depletion  Muscle:  Temple Region: severe depletion Clavicle Bone Region: severe depletion Clavicle and Acromion Bone Region: severe depletion Scapular Bone Region: severe depletion Dorsal Hand: severe depletion Patellar Region: severe depletion Anterior Thigh Region: severe depletion Posterior Calf Region: severe depletion  Edema: not present    Height: Ht Readings from Last 1 Encounters:  07/27/14 6' (1.829 m)    Weight: Wt Readings from Last 1 Encounters:  07/27/14 148 lb 5.9 oz (67.3 kg)    Ideal Body Weight: 80.9 kg   % Ideal Body Weight: 83%  Wt Readings from Last 10 Encounters:  07/27/14 148 lb 5.9 oz (67.3 kg)  11/21/13 170 lb (77.111 kg)    Usual  Body Weight: 170 lb   % Usual Body Weight: 87%  BMI:  Body mass index is 20.12 kg/(m^2).  Estimated Nutritional Needs: Kcal: 2000-2200 Protein: 100-115 grams Fluid: > 2 L/day  Skin: skin abrasion  Diet Order: Diet clear liquid  EDUCATION NEEDS: -No education needs identified at this time   Intake/Output Summary (Last 24 hours) at 07/28/14 1146 Last data filed at 07/28/14 1048  Gross per 24 hour  Intake  592.2 ml  Output      0 ml  Net  592.2 ml    Last BM: 12/3   Labs:   Recent Labs Lab 07/27/14 1902 07/28/14 0530  NA 135* 135*  K 4.3 4.1  CL 95* 94*  CO2 25 25  BUN 33* 32*  CREATININE 1.40* 1.40*  CALCIUM 8.9 8.8  GLUCOSE 170* 100*    CBG (last 3)  No results for input(s): GLUCAP in the last 72 hours.  Scheduled Meds: . antiseptic oral rinse  7 mL Mouth Rinse BID  . aspirin EC  325 mg Oral Daily  . atorvastatin  80 mg Oral q1800  . furosemide  40 mg Intravenous BID  . pantoprazole  40 mg Oral Q0600    Continuous Infusions: . DOPamine 10 mcg/kg/min (07/28/14 1048)  . heparin 1,000 Units/hr (07/28/14 0800)  . milrinone 0.375 mcg/kg/min (07/28/14 0951)    History reviewed. No pertinent past medical history.  History reviewed. No pertinent past surgical history.  [place], [place], [place] Pager [phone] After Hours Pager

## 2014-07-28 NOTE — Procedures (Signed)
Arterial Catheter Insertion Procedure Note Carl [name] [id number] 1951-07-12  Procedure: Insertion of Arterial Catheter  Indications: Blood pressure monitoring  Procedure Details Consent: Risks of procedure as well as the alternatives and risks of each were explained to the (patient/caregiver).  Consent for procedure obtained. Time Out: Verified patient identification, verified procedure, site/side was marked, verified correct patient position, special equipment/implants available, medications/allergies/relevent history reviewed, required imaging and test results available.  Performed  Maximum sterile technique was used including antiseptics, gloves, hand hygiene and mask. Skin prep: Chlorhexidine; local anesthetic administered 20 gauge catheter was inserted into left radial artery using the Seldinger technique.  Evaluation Blood flow good; BP tracing good. Complications: No apparent complications.   Carl [name] K. 07/28/2014

## 2014-07-28 NOTE — Progress Notes (Signed)
ANTICOAGULATION CONSULT NOTE - Follow Up Consult  Pharmacy Consult for heparin Indication: chest pain/ACS  No Known Allergies  Patient Measurements: Height: 6' (182.9 cm) Weight: 148 lb 5.9 oz (67.3 kg) IBW/kg (Calculated) : 77.6 Heparin Dosing Weight: 67.3kg  Vital Signs: Temp: 97.6 F (36.4 C) (12/04 0800) Temp Source: Oral (12/04 0800) BP: 104/61 mmHg (12/04 1500)  Labs:  Recent Labs  07/27/14 1902 07/27/14 2258 07/28/14 0530 07/28/14 1550  HGB 15.0  --  14.8  --   HCT 44.7  --  43.0  --   PLT 184  --  167  --   LABPROT 15.8*  --   --   --   INR 1.25  --   --   --   HEPARINUNFRC  --   --  0.10* <0.10*  CREATININE 1.40*  --  1.40*  --   TROPONINI 7.77* 6.68* 7.08*  --     Estimated Creatinine Clearance: 51.4 mL/min (by C-G formula based on Cr of 1.4).   Medications:  Infusions:  . DOPamine 15 mcg/kg/min (07/28/14 1508)  . heparin 1,000 Units/hr (07/28/14 0800)  . milrinone 0.375 mcg/kg/min (07/28/14 0951)  . norepinephrine (LEVOPHED) Adult infusion      Assessment: Carl [name] continues on IV heparin for CAD awaiting CVTS consult. Heparin level is undetectable. RN confirmed not issues with infusion. No bleeding noted.   Goal of Therapy:  Heparin level 0.3-0.7 units/ml Monitor platelets by anticoagulation protocol: Yes   Plan:  1. Give a small bolus of 1200 units IV x 1 then increase heparin to 1200 units/hr 2. Check a 6 hour heparin level  Salome Arnt, PharmD, BCPS Pager # [phone] 07/28/2014 5:25 PM

## 2014-07-28 NOTE — Plan of Care (Signed)
Problem: Phase I Progression Outcomes Goal: Aspirin unless contraindicated Outcome: Completed/Met Date Met:  07/28/14 Goal: Voiding-avoid urinary catheter unless indicated Outcome: Completed/Met Date Met:  07/28/14  Problem: Phase II Progression Outcomes Goal: Anginal pain absent Outcome: Completed/Met Date Met:  07/28/14 Goal: Vascular site scale level 0 - I Vascular Site Scale Level 0: No bruising/bleeding/hematoma Level I (Mild): Bruising/Ecchymosis, minimal bleeding/ooozing, palpable hematoma < 3 cm Level II (Moderate): Bleeding not affecting hemodynamic parameters, pseudoaneurysm, palpable hematoma > 3 cm Level III (Severe) Bleeding which affects hemodynamic parameters or retroperitoneal hemorrhage  Outcome: Completed/Met Date Met:  07/28/14     

## 2014-07-28 NOTE — Progress Notes (Signed)
Pharmacy Consult for Milrinone (Primacor) Initiation  Indication:   Acute Decompensated Heart Failure with volume overload and low cardiac output  No Known Allergies  Temp:  [97.6 F (36.4 C)-97.7 F (36.5 C)] 97.6 F (36.4 C) (12/04 0800) Cardiac Rhythm:  [-] Normal sinus rhythm (12/04 1200) Resp:  [13-31] 22 (12/04 1200) BP: (55-113)/(33-75) 113/65 mmHg (12/04 1200) SpO2:  [83 %-99 %] 97 % (12/04 1200) Weight:  [148 lb 5.9 oz (67.3 kg)] 148 lb 5.9 oz (67.3 kg) (12/03 1530)  LABS    Component Value Date/Time   NA 135* 07/28/2014 0530   K 4.1 07/28/2014 0530   CL 94* 07/28/2014 0530   CO2 25 07/28/2014 0530   GLUCOSE 100* 07/28/2014 0530   BUN 32* 07/28/2014 0530   CREATININE 1.40* 07/28/2014 0530   CALCIUM 8.8 07/28/2014 0530   GFRNONAA 52* 07/28/2014 0530   GFRAA 60* 07/28/2014 0530   Last magnesium: No results found for: MG Estimated Creatinine Clearance: 51.4 mL/min (by C-G formula based on Cr of 1.4). CREATININE: 1.4 mg/dL ABNORMAL (07/28/14 0530) Estimated creatinine clearance - 51.4 mL/min estimated creatinine clearance is 51.4 mL/min (by C-G formula based on Cr of 1.4).   Intake/Output Summary (Last 24 hours) at 07/28/14 1325 Last data filed at 07/28/14 1048  Gross per 24 hour  Intake  592.2 ml  Output      0 ml  Net  592.2 ml    Filed Weights   07/27/14 1530  Weight: 148 lb 5.9 oz (67.3 kg)    Assessment:   63 y.o. male admitted 07/27/2014 5 vessel disease. He is awaiting CABG evaluation. Pharmacy consulted to start milrinone. Potassium, Magnesium, SCr, and vital signs are stable. Call physician for replacement if potassium is < 4 or magnesium is < 2 and replacement has not already been ordered.  Milrinone can cause arrhythmias.  Monitor patient for ECG changes.  Plan is to initiate milrinone for inotropic support.  Plan:  1. Initiate milrinone based on renal function: (Consider starting dose of 0.125 - 0.25 for patients with SBP <152mmHg) [employer]  Calculated CrCl Dose  [x]  > 50 ml/min 0.375 mcg/kg/min  []  20-49 ml/min 0.250 mcg/kg/min  []  < 20 ml/min 0.125 mcg/kg/min   2. Nursing to monitor vital signs per milrinone protocol and physician parameters. 3. Pharmacy to follow peripherally, please reconsult if needed or there is further questions. 4.  Please contact MD for further dosing instructions.  Thank you for allowing [place] to be a part of this patient's care.  Theron Arista, PharmD Clinical Pharmacist - Resident Pager: [phone] 12/4/20151:26 PM

## 2014-07-28 NOTE — Progress Notes (Signed)
Respiratory attempted Arterial line x3.  Was unable to thread.  RN notified

## 2014-07-28 NOTE — Procedures (Signed)
Central Venous Catheter Insertion Procedure Note GARAN FRAPPIER [id number] Jan 23, 1951  Procedure: Insertion of Central Venous Catheter Indications: Assessment of intravascular volume, Drug and/or fluid administration and Frequent blood sampling  Procedure Details Consent: Risks of procedure as well as the alternatives and risks of each were explained to the (patient/caregiver).  Consent for procedure obtained. Time Out: Verified patient identification, verified procedure, site/side was marked, verified correct patient position, special equipment/implants available, medications/allergies/relevent history reviewed, required imaging and test results available.  Performed  Maximum sterile technique was used including antiseptics, cap, gloves, gown, hand hygiene, mask and sheet. Skin prep: Chlorhexidine; local anesthetic administered A antimicrobial bonded/coated triple lumen catheter was placed in the left subclavian vein using the Seldinger technique.  Evaluation Blood flow good Complications: No apparent complications Patient did tolerate procedure well. Chest X-ray ordered to verify placement.  CXR: pending.  Reginia Forts K. 07/28/2014, 8:12 PM

## 2014-07-29 DIAGNOSIS — R570 Cardiogenic shock: Secondary | ICD-10-CM | POA: Diagnosis present

## 2014-07-29 DIAGNOSIS — I2109 ST elevation (STEMI) myocardial infarction involving other coronary artery of anterior wall: Principal | ICD-10-CM | POA: Diagnosis present

## 2014-07-29 DIAGNOSIS — I2510 Atherosclerotic heart disease of native coronary artery without angina pectoris: Secondary | ICD-10-CM

## 2014-07-29 DIAGNOSIS — E43 Unspecified severe protein-calorie malnutrition: Secondary | ICD-10-CM | POA: Diagnosis present

## 2014-07-29 DIAGNOSIS — N179 Acute kidney failure, unspecified: Secondary | ICD-10-CM

## 2014-07-29 DIAGNOSIS — Z720 Tobacco use: Secondary | ICD-10-CM

## 2014-07-29 LAB — CBC
HCT: 41.8 % (ref 39.0–52.0)
Hemoglobin: 14.7 g/dL (ref 13.0–17.0)
MCH: 32.1 pg (ref 26.0–34.0)
MCHC: 35.2 g/dL (ref 30.0–36.0)
MCV: 91.3 fL (ref 78.0–100.0)
PLATELETS: 204 10*3/uL (ref 150–400)
RBC: 4.58 MIL/uL (ref 4.22–5.81)
RDW: 13.1 % (ref 11.5–15.5)
WBC: 11.6 10*3/uL — ABNORMAL HIGH (ref 4.0–10.5)

## 2014-07-29 LAB — BASIC METABOLIC PANEL
ANION GAP: 15 (ref 5–15)
BUN: 29 mg/dL — ABNORMAL HIGH (ref 6–23)
CALCIUM: 8.7 mg/dL (ref 8.4–10.5)
CO2: 28 mEq/L (ref 19–32)
Chloride: 93 mEq/L — ABNORMAL LOW (ref 96–112)
Creatinine, Ser: 1.26 mg/dL (ref 0.50–1.35)
GFR calc non Af Amer: 59 mL/min — ABNORMAL LOW (ref >90)
GFR, EST AFRICAN AMERICAN: 68 mL/min — AB (ref >90)
Glucose, Bld: 132 mg/dL — ABNORMAL HIGH (ref 70–99)
Potassium: 3.8 mEq/L (ref 3.7–5.3)
SODIUM: 136 meq/L — AB (ref 137–147)

## 2014-07-29 LAB — HEPARIN LEVEL (UNFRACTIONATED)
Heparin Unfractionated: 0.55 IU/mL (ref 0.30–0.70)
Heparin Unfractionated: 0.54 IU/mL (ref 0.30–0.70)

## 2014-07-29 LAB — PLATELET INHIBITION P2Y12: Platelet Function  P2Y12: 171 [PRU] — ABNORMAL LOW (ref 194–418)

## 2014-07-29 LAB — CARBOXYHEMOGLOBIN
Carboxyhemoglobin: 1 % (ref 0.5–1.5)
Methemoglobin: 0.8 % (ref 0.0–1.5)
O2 Saturation: 70.4 %
Total hemoglobin: 15.6 g/dL (ref 13.5–18.0)

## 2014-07-29 NOTE — Consult Note (Signed)
[address]       [place] [address]             [phone]      Cardiothoracic Surgery Consultation  Reason for Consult: Severe multi-vessel coronary disease and severe LV dysfunction s/p late presentation of anteroseptal STEMI Referring Physician: Dr. Jerilynn Mages. Carl [name] is an 63 y.o. male.  HPI:   The patient is a 63 year old gentleman with a history of mild mental retardation who lives with his mother and has a strong family history of premature coronary artery disease. He presented with a few week history of not feeling well with back pain radiating to both arms, associated with dry cough and shortness of breath. He had recently been treated for pneumonia before Thanksgiving by his primary physician without improvement in symptoms. When he presented to the ER his troponin was 24 and ECG showed anteroseptal ST elevation with lateral T wave changes and large anterior Q-waves. He underwent cath at [facility] that reportedly showed severe 3 vessel CAD with severe LV dysfunction with an EF by echo of 15%, elevated LVEDP. He also reportedly had moderate ischemic MR on echo.  I have not been able to review the cath films yet due to the [facility] cardiology system being down. The patient was transferred to [facility] and was hypotensive. He was started on dopamine and milrinone which have subsequently been weaned off. He remains on Levophed.  History reviewed. No pertinent past medical history.  History reviewed. No pertinent past surgical history.  History reviewed. No pertinent family history.  Social History:  reports that he has been smoking.  He does not have any smokeless tobacco history on file. He reports that he does not drink alcohol. His drug history is not on file.  Allergies: No Known Allergies  Medications:  I have reviewed the patient's current medications. Prior to Admission:  Prescriptions prior to admission  Medication Sig Dispense Refill Last Dose  .  furosemide (LASIX) 40 MG tablet Take 40 mg by mouth daily.   unknown  . potassium chloride (K-DUR,KLOR-CON) 10 MEQ tablet Take 10 mEq by mouth daily.   unknown  . traMADol (ULTRAM) 50 MG tablet Take 50 mg by mouth 2 (two) times daily as needed.   unknown  . levofloxacin (LEVAQUIN) 500 MG tablet Take 500 mg by mouth daily.   Completed Course at Unknown time   Scheduled: . antiseptic oral rinse  7 mL Mouth Rinse BID  . aspirin EC  325 mg Oral Daily  . atorvastatin  80 mg Oral q1800  . feeding supplement (RESOURCE BREEZE)  1 Container Oral TID BM  . furosemide  40 mg Intravenous BID  . pantoprazole  40 mg Oral Q0600  . THROMBI-PAD  1 each Topical Once   Continuous: . DOPamine Stopped (07/28/14 2200)  . heparin 1,200 Units (07/28/14 1900)  . milrinone Stopped (07/28/14 2111)  . norepinephrine (LEVOPHED) Adult infusion 4 mcg/min (07/29/14 1346)   Carl [name]:DCVUD/THYHOOIL arterial line **AND** sodium chloride, acetaminophen, ALPRAZolam, nitroGLYCERIN, ondansetron (ZOFRAN) IV, zolpidem Anti-infectives    None      Results for orders placed or performed during the hospital encounter of 07/27/14 (from the past 48 hour(s))  MRSA PCR Screening     Status: None   Collection Time: 07/27/14  3:20 PM  Result Value Ref Range   MRSA by PCR NEGATIVE NEGATIVE    Comment:        The GeneXpert MRSA  Assay (FDA approved for NASAL specimens only), is one component of a comprehensive MRSA colonization surveillance program. It is not intended to diagnose MRSA infection nor to guide or monitor treatment for MRSA infections.   Comprehensive metabolic panel     Status: Abnormal   Collection Time: 07/27/14  7:02 PM  Result Value Ref Range   Sodium 135 (L) 137 - 147 mEq/L   Potassium 4.3 3.7 - 5.3 mEq/L   Chloride 95 (L) 96 - 112 mEq/L   CO2 25 19 - 32 mEq/L   Glucose, Bld 170 (H) 70 - 99 mg/dL   BUN 33 (H) 6 - 23 mg/dL   Creatinine, Ser 1.40 (H) 0.50 - 1.35 mg/dL   Calcium 8.9 8.4 - 10.5 mg/dL    Total Protein 6.5 6.0 - 8.3 g/dL   Albumin 3.5 3.5 - 5.2 g/dL   AST 58 (H) 0 - 37 U/L   ALT 41 0 - 53 U/L   Alkaline Phosphatase 64 39 - 117 U/L   Total Bilirubin 1.2 0.3 - 1.2 mg/dL   GFR calc non Af Amer 52 (L) >90 mL/min   GFR calc Af Amer 60 (L) >90 mL/min    Comment: (NOTE) The eGFR has been calculated using the CKD EPI equation. This calculation has not been validated in all clinical situations. eGFR's persistently <90 mL/min signify possible Chronic Kidney Disease.    Anion gap 15 5 - 15  TSH     Status: None   Collection Time: 07/27/14  7:02 PM  Result Value Ref Range   TSH 0.783 0.350 - 4.500 uIU/mL  Troponin I-(serum)     Status: Abnormal   Collection Time: 07/27/14  7:02 PM  Result Value Ref Range   Troponin I 7.77 (HH) <0.30 ng/mL    Comment:        Due to the release kinetics of cTnI, a negative result within the first hours of the onset of symptoms does not rule out myocardial infarction with certainty. If myocardial infarction is still suspected, repeat the test at appropriate intervals. CRITICAL RESULT CALLED TO, READ BACK BY AND VERIFIED WITH: C FOWLER,RN 2024 07/27/14 D BRADLEY   CBC WITH DIFFERENTIAL     Status: Abnormal   Collection Time: 07/27/14  7:02 PM  Result Value Ref Range   WBC 6.8 4.0 - 10.5 K/uL   RBC 4.75 4.22 - 5.81 MIL/uL   Hemoglobin 15.0 13.0 - 17.0 g/dL   HCT 44.7 39.0 - 52.0 %   MCV 94.1 78.0 - 100.0 fL   MCH 31.6 26.0 - 34.0 pg   MCHC 33.6 30.0 - 36.0 g/dL   RDW 13.9 11.5 - 15.5 %   Platelets 184 150 - 400 K/uL   Neutrophils Relative % 78 (H) 43 - 77 %   Neutro Abs 5.3 1.7 - 7.7 K/uL   Lymphocytes Relative 14 12 - 46 %   Lymphs Abs 1.0 0.7 - 4.0 K/uL   Monocytes Relative 8 3 - 12 %   Monocytes Absolute 0.5 0.1 - 1.0 K/uL   Eosinophils Relative 0 0 - 5 %   Eosinophils Absolute 0.0 0.0 - 0.7 K/uL   Basophils Relative 0 0 - 1 %   Basophils Absolute 0.0 0.0 - 0.1 K/uL  Protime-INR     Status: Abnormal   Collection Time: 07/27/14   7:02 PM  Result Value Ref Range   Prothrombin Time 15.8 (H) 11.6 - 15.2 seconds   INR 1.25 0.00 - 1.49  Troponin  I-(serum)     Status: Abnormal   Collection Time: 07/27/14 10:58 PM  Result Value Ref Range   Troponin I 6.68 (HH) <0.30 ng/mL    Comment:        Due to the release kinetics of cTnI, a negative result within the first hours of the onset of symptoms does not rule out myocardial infarction with certainty. If myocardial infarction is still suspected, repeat the test at appropriate intervals. CRITICAL VALUE NOTED.  VALUE IS CONSISTENT WITH PREVIOUSLY REPORTED AND CALLED VALUE.   Troponin I-(serum)     Status: Abnormal   Collection Time: 07/28/14  5:30 AM  Result Value Ref Range   Troponin I 7.08 (HH) <0.30 ng/mL    Comment:        Due to the release kinetics of cTnI, a negative result within the first hours of the onset of symptoms does not rule out myocardial infarction with certainty. If myocardial infarction is still suspected, repeat the test at appropriate intervals. CRITICAL VALUE NOTED.  VALUE IS CONSISTENT WITH PREVIOUSLY REPORTED AND CALLED VALUE.   Basic metabolic panel     Status: Abnormal   Collection Time: 07/28/14  5:30 AM  Result Value Ref Range   Sodium 135 (L) 137 - 147 mEq/L   Potassium 4.1 3.7 - 5.3 mEq/L   Chloride 94 (L) 96 - 112 mEq/L   CO2 25 19 - 32 mEq/L   Glucose, Bld 100 (H) 70 - 99 mg/dL   BUN 32 (H) 6 - 23 mg/dL   Creatinine, Ser 1.40 (H) 0.50 - 1.35 mg/dL   Calcium 8.8 8.4 - 10.5 mg/dL   GFR calc non Af Amer 52 (L) >90 mL/min   GFR calc Af Amer 60 (L) >90 mL/min    Comment: (NOTE) The eGFR has been calculated using the CKD EPI equation. This calculation has not been validated in all clinical situations. eGFR's persistently <90 mL/min signify possible Chronic Kidney Disease.    Anion gap 16 (H) 5 - 15  CBC     Status: None   Collection Time: 07/28/14  5:30 AM  Result Value Ref Range   WBC 8.5 4.0 - 10.5 K/uL   RBC 4.60 4.22 -  5.81 MIL/uL   Hemoglobin 14.8 13.0 - 17.0 g/dL   HCT 43.0 39.0 - 52.0 %   MCV 93.5 78.0 - 100.0 fL   MCH 32.2 26.0 - 34.0 pg   MCHC 34.4 30.0 - 36.0 g/dL   RDW 13.7 11.5 - 15.5 %   Platelets 167 150 - 400 K/uL  Heparin level (unfractionated)     Status: Abnormal   Collection Time: 07/28/14  5:30 AM  Result Value Ref Range   Heparin Unfractionated 0.10 (L) 0.30 - 0.70 IU/mL    Comment:        IF HEPARIN RESULTS ARE BELOW EXPECTED VALUES, AND PATIENT DOSAGE HAS BEEN CONFIRMED, SUGGEST FOLLOW UP TESTING OF ANTITHROMBIN III LEVELS.   Lipid panel     Status: Abnormal   Collection Time: 07/28/14  5:30 AM  Result Value Ref Range   Cholesterol 129 0 - 200 mg/dL   Triglycerides 61 <150 mg/dL   HDL 33 (L) >39 mg/dL   Total CHOL/HDL Ratio 3.9 RATIO   VLDL 12 0 - 40 mg/dL   LDL Cholesterol 84 0 - 99 mg/dL    Comment:        Total Cholesterol/HDL:CHD Risk Coronary Heart Disease Risk Table  Men   Women  1/2 Average Risk   3.4   3.3  Average Risk       5.0   4.4  2 X Average Risk   9.6   7.1  3 X Average Risk  23.4   11.0        Use the calculated Patient Ratio above and the CHD Risk Table to determine the patient's CHD Risk.        ATP III CLASSIFICATION (LDL):  <100     mg/dL   Optimal  100-129  mg/dL   Near or Above                    Optimal  130-159  mg/dL   Borderline  160-189  mg/dL   High  >190     mg/dL   Very High   Heparin level (unfractionated)     Status: Abnormal   Collection Time: 07/28/14  3:50 PM  Result Value Ref Range   Heparin Unfractionated <0.10 (L) 0.30 - 0.70 IU/mL    Comment:        IF HEPARIN RESULTS ARE BELOW EXPECTED VALUES, AND PATIENT DOSAGE HAS BEEN CONFIRMED, SUGGEST FOLLOW UP TESTING OF ANTITHROMBIN III LEVELS.   Carboxyhemoglobin     Status: None   Collection Time: 07/28/14  9:15 PM  Result Value Ref Range   Total hemoglobin 16.5 13.5 - 18.0 g/dL   O2 Saturation 77.3 %   Carboxyhemoglobin 1.1 0.5 - 1.5 %    Methemoglobin 0.9 0.0 - 1.5 %  Heparin level (unfractionated)     Status: None   Collection Time: 07/29/14 12:30 AM  Result Value Ref Range   Heparin Unfractionated 0.55 0.30 - 0.70 IU/mL    Comment:        IF HEPARIN RESULTS ARE BELOW EXPECTED VALUES, AND PATIENT DOSAGE HAS BEEN CONFIRMED, SUGGEST FOLLOW UP TESTING OF ANTITHROMBIN III LEVELS.   Heparin level (unfractionated)     Status: None   Collection Time: 07/29/14  5:00 AM  Result Value Ref Range   Heparin Unfractionated 0.54 0.30 - 0.70 IU/mL    Comment:        IF HEPARIN RESULTS ARE BELOW EXPECTED VALUES, AND PATIENT DOSAGE HAS BEEN CONFIRMED, SUGGEST FOLLOW UP TESTING OF ANTITHROMBIN III LEVELS.   CBC     Status: Abnormal   Collection Time: 07/29/14  5:00 AM  Result Value Ref Range   WBC 11.6 (H) 4.0 - 10.5 K/uL   RBC 4.58 4.22 - 5.81 MIL/uL   Hemoglobin 14.7 13.0 - 17.0 g/dL   HCT 41.8 39.0 - 52.0 %   MCV 91.3 78.0 - 100.0 fL   MCH 32.1 26.0 - 34.0 pg   MCHC 35.2 30.0 - 36.0 g/dL   RDW 13.1 11.5 - 15.5 %   Platelets 204 150 - 400 K/uL  Basic metabolic panel     Status: Abnormal   Collection Time: 07/29/14  5:00 AM  Result Value Ref Range   Sodium 136 (L) 137 - 147 mEq/L   Potassium 3.8 3.7 - 5.3 mEq/L   Chloride 93 (L) 96 - 112 mEq/L   CO2 28 19 - 32 mEq/L   Glucose, Bld 132 (H) 70 - 99 mg/dL   BUN 29 (H) 6 - 23 mg/dL   Creatinine, Ser 1.26 0.50 - 1.35 mg/dL   Calcium 8.7 8.4 - 10.5 mg/dL   GFR calc non Af Amer 59 (L) >90 mL/min   GFR calc Af Wyvonnia Lora  68 (L) >90 mL/min    Comment: (NOTE) The eGFR has been calculated using the CKD EPI equation. This calculation has not been validated in all clinical situations. eGFR's persistently <90 mL/min signify possible Chronic Kidney Disease.    Anion gap 15 5 - 15  Carboxyhemoglobin     Status: None   Collection Time: 07/29/14  5:56 AM  Result Value Ref Range   Total hemoglobin 15.6 13.5 - 18.0 g/dL   O2 Saturation 70.4 %   Carboxyhemoglobin 1.0 0.5 - 1.5 %    Methemoglobin 0.8 0.0 - 1.5 %  Platelet inhibition p2y12     Status: Abnormal   Collection Time: 07/29/14 11:53 AM  Result Value Ref Range   Platelet Function  P2Y12 171 (L) 194 - 418 PRU    Comment:        The literature has shown a direct correlation of PRU values over 230 with higher risks of thrombotic events.  Lower PRU values are associated with platelet inhibition.     Dg Chest Port 1 View  07/28/2014   CLINICAL DATA:  Central line placement  EXAM: PORTABLE CHEST - 1 VIEW  COMPARISON:  08/07/2014 at 0856 hr  FINDINGS: Cardiomegaly with mild to moderate interstitial edema. Suspected layering right pleural effusion. No pneumothorax.  Left subclavian venous catheter terminates at the cavoatrial junction.  IMPRESSION: Cardiomegaly with mild to moderate interstitial edema.  Suspected layering right pleural effusion.  Left subclavian venous catheter terminates at the cavoatrial junction.   Electronically Signed   By: Julian Hy M.D.   On: 07/28/2014 20:48   Dg Chest Port 1v Same Day  07/28/2014   CLINICAL DATA:  Shortness of breath, previous smoker  EXAM: PORTABLE CHEST - 1 VIEW SAME DAY  COMPARISON:  None  FINDINGS: Borderline cardiomegaly. Mild interstitial prominence bilaterally without convincing pulmonary edema. Hazy atelectasis or infiltrate in right middle lobe.  IMPRESSION: Mild interstitial prominence without convincing pulmonary edema. Hazy atelectasis or infiltrate in right middle lobe.   Electronically Signed   By: Lahoma Crocker M.D.   On: 07/28/2014 09:09    Review of Systems  Constitutional: Positive for malaise/fatigue. Negative for fever, chills, weight loss and diaphoresis.  HENT: Negative.   Eyes: Negative.   Respiratory: Positive for cough and shortness of breath. Negative for sputum production.   Cardiovascular: Positive for chest pain and orthopnea. Negative for leg swelling and PND.  Gastrointestinal: Negative.   Genitourinary: Negative.   Skin: Negative.     Neurological: Negative.   Endo/Heme/Allergies: Negative.   Psychiatric/Behavioral:       Mild mental retardation   Blood pressure 104/60, temperature 97.9 F (36.6 C), temperature source Oral, resp. rate 21, height 6' (1.829 m), weight 64.6 kg (142 lb 6.7 oz), SpO2 100 %. Physical Exam  Constitutional: He is oriented to person, place, and time.  Malnourished appearing gentleman who looks older than his age in no distress.  HENT:  Head: Normocephalic.  Mouth/Throat: Oropharynx is clear and moist.  Eyes: EOM are normal. Pupils are equal, round, and reactive to light.  Neck: Normal range of motion. Neck supple. No JVD present. No thyromegaly present.  Cardiovascular: Normal rate, regular rhythm, normal heart sounds and intact distal pulses.   No murmur heard. Respiratory: Effort normal. He has no wheezes. He has no rales.  Decreased breath sounds in lower lobes  GI: Soft. Bowel sounds are normal. He exhibits no distension and no mass. There is no tenderness.  Musculoskeletal: Normal range of motion.  He exhibits no edema.  Lymphadenopathy:    He has no cervical adenopathy.  Neurological: He is alert and oriented to person, place, and time. He has normal strength. No sensory deficit.  Skin: Skin is warm and dry.  Psychiatric: He has a normal mood and affect.   Cath and echo done at [facility]  Assessment/Plan:  He reportedly has severe multi-vessel CAD with severe LV dysfunction and moderate ischemic MR. He presented late with a large anteroseptal MI. He was apparently felt to be in cardiogenic shock on initial presentation but dopamine and milrinone have been stopped and he is now on levophed at 4 mcg and it is being weaned. Co-ox this am was 70%. I will need to review his cath and echo before I can determine if he is an operative candidate. The [facility] cardiology system has been down yesterday and today and as soon as I can view the studies I will discuss this further with him. His  operative risk would certainly be elevated due to poor LV function, recent large MI, ischemic MR, stage 3 CKD, and malnutrition. He had reportedly received Plavix at [facility] but that is unclear. We can check a P2Y12 level.  Ovila Lepage K 07/29/2014, 2:56 PM

## 2014-07-29 NOTE — Progress Notes (Signed)
ANTICOAGULATION CONSULT NOTE - Follow Up Consult  Pharmacy Consult for Heparin Indication: CAD awaiting CABG  No Known Allergies  Patient Measurements: Height: 6' (182.9 cm) Weight: 142 lb 6.7 oz (64.6 kg) IBW/kg (Calculated) : 77.6 Heparin Dosing Weight: 67.3 kg  Vital Signs: Temp: 98.4 F (36.9 C) (12/05 0800) Temp Source: Oral (12/05 0800)  Labs:  Recent Labs  07/27/14 1902 07/27/14 2258  07/28/14 0530 07/28/14 1550 07/29/14 0030 07/29/14 0500  HGB 15.0  --   --  14.8  --   --  14.7  HCT 44.7  --   --  43.0  --   --  41.8  PLT 184  --   --  167  --   --  204  LABPROT 15.8*  --   --   --   --   --   --   INR 1.25  --   --   --   --   --   --   HEPARINUNFRC  --   --   < > 0.10* <0.10* 0.55 0.54  CREATININE 1.40*  --   --  1.40*  --   --  1.26  TROPONINI 7.77* 6.68*  --  7.08*  --   --   --   < > = values in this interval not displayed.  Estimated Creatinine Clearance: 54.8 mL/min (by C-G formula based on Cr of 1.26).   Assessment: 63 y.o male presented to [facility] on 07/[facility]/14 with SOB, EKG showed ST-T changes in V3-V4.Cath 12/3 with 3V CAD. Patient was transferred to [facility] for CABG evaluation.Plavix washout. Pharmacy consulted to start heparin at 9pm on 12/3 and Milrinone on 12/4. NO DOCUMENTATION OF PLAVIX ADMINISTRATION AT [facility].  Anticoagulation: CAD awaiting CABG. Heparin levels this AM 0.55 and 0.54 in goal range. Hgb and plts stable.  Infectious Disease: Afebrile, WBC up to 11.6? Afebrile.  Cardiovascular: VSS. Severe ICM with EF 15-20%,Troponins positive X 3. HDL 33, LDL 84. Meds: ASA325, Lipitor 80, IV Lasix, Levophed drip (DA, Milrinone d/c'd)  Endocrinology: CBGs < 180. TSH WNL  Gastrointestinal / Nutrition: AST elevated slightly at 58, Pantoprazole  Neurology: Mild mental retardation.Alprazolam prn, Ambien prn  Nephrology: CKD Stage 3. Scr 1.26 down this am.  Pulmonary: 2L [place]  PTA Medication Issues:  Best Practices: Heparin  Goal of Therapy:   Heparin level 0.3-0.7 units/ml Monitor platelets by anticoagulation protocol: Yes   Plan:  1) Continue Heparin 1200 units/hr 2) Platelet reactivity test per conversation with Dr. Gardiner Fanti. Alford Highland, PharmD, BCPS Clinical Staff Pharmacist Pager [phone]  Eilene Ghazi Stillinger 07/29/2014,8:39 AM

## 2014-07-29 NOTE — Progress Notes (Signed)
ANTICOAGULATION CONSULT NOTE - Follow Up Consult  Pharmacy Consult for heparin Indication: CAD  Labs:  Recent Labs  07/27/14 1902 07/27/14 2258 07/28/14 0530 07/28/14 1550 07/29/14 0030  HGB 15.0  --  14.8  --   --   HCT 44.7  --  43.0  --   --   PLT 184  --  167  --   --   LABPROT 15.8*  --   --   --   --   INR 1.25  --   --   --   --   HEPARINUNFRC  --   --  0.10* <0.10* 0.55  CREATININE 1.40*  --  1.40*  --   --   TROPONINI 7.77* 6.68* 7.08*  --   --      Assessment/Plan:  63yo male therapeutic on heparin after rate increases. Will continue gtt at current rate and confirm stable with am labs.   Wynona Neat, PharmD, BCPS  07/29/2014,3:19 AM

## 2014-07-29 NOTE — Progress Notes (Signed)
Subjective:  63 year old male with late presenting Anterolateral STEMI with mild mental retardation, smoker with cardiogenic shock, AKI improving.    FAMILY HISTORY: Remarkable for premature coronary artery disease. His father died of complications of myocardial infarction in his 19s. His brother had multiple cardiac stents.    Central line placed 12/4 Pressor support.   Alert, mentioned no chest pain. +constipation he states.  Objective:  Vital Signs in the last 24 hours: Temp:  [97.4 F (36.3 C)-98.5 F (36.9 C)] 98.4 F (36.9 C) (12/05 0800) Resp:  [13-28] 23 (12/05 0800) BP: (81-113)/(37-68) 104/60 mmHg (12/04 2000) SpO2:  [83 %-100 %] 100 % (12/05 0800) Arterial Line BP: (90-117)/(57-72) 117/67 mmHg (12/05 0800) Weight:  [142 lb 6.7 oz (64.6 kg)] 142 lb 6.7 oz (64.6 kg) (12/05 0355)  Intake/Output from previous day: 12/04 0701 - 12/05 0700 In: 1008.9 [I.V.:1008.9] Out: 945 [Urine:945]   Physical Exam: General: Well developed, well nourished, in no acute distress. Strong body odor.  Head: Normocephalic, atraumatic, sclera non-icteric, oropharynx is clear Neck: Negative for carotid bruits. JVD is elevated. No adenopathy Lungs: Clear bilaterally to auscultation without wheezes, rales, or rhonchi. Decreased BS in left base. Heart: RRR S1 S2 with harsh grade 3/6 systolic murmur at the apex. Left subclavian line.  Abdomen: Soft, non-tender, non-distended with normoactive bowel sounds. No hepatomegaly. No rebound/guarding. No obvious abdominal masses. Msk: Strength and tone appears normal for age. Extremities: No clubbing, cyanosis or edema. Distal pedal pulses are 2+ and equal bilaterally. No groin hematoma at cath site.  Neuro: Alert and oriented X 3. Moves all extremities spontaneously. Psych: Responds to questions appropriately with a normal affect.    Lab Results:  Recent Labs  07/28/14 0530 07/29/14 0500  WBC 8.5 11.6*  HGB 14.8 14.7  PLT 167 204     Recent Labs  07/28/14 0530 07/29/14 0500  NA 135* 136*  K 4.1 3.8  CL 94* 93*  CO2 25 28  GLUCOSE 100* 132*  BUN 32* 29*  CREATININE 1.40* 1.26    Recent Labs  07/27/14 2258 07/28/14 0530  TROPONINI 6.68* 7.08*   Hepatic Function Panel  Recent Labs  07/27/14 1902  PROT 6.5  ALBUMIN 3.5  AST 58*  ALT 41  ALKPHOS 64  BILITOT 1.2    Recent Labs  07/28/14 0530  CHOL 129   No results for input(s): PROTIME in the last 72 hours.  Imaging: Dg Chest Port 1 View  07/28/2014   CLINICAL DATA:  Central line placement  EXAM: PORTABLE CHEST - 1 VIEW  COMPARISON:  08/07/2014 at 0856 hr  FINDINGS: Cardiomegaly with mild to moderate interstitial edema. Suspected layering right pleural effusion. No pneumothorax.  Left subclavian venous catheter terminates at the cavoatrial junction.  IMPRESSION: Cardiomegaly with mild to moderate interstitial edema.  Suspected layering right pleural effusion.  Left subclavian venous catheter terminates at the cavoatrial junction.   Electronically Signed   By: Julian Hy M.D.   On: 07/28/2014 20:48   Dg Chest Port 1v Same Day  07/28/2014   CLINICAL DATA:  Shortness of breath, previous smoker  EXAM: PORTABLE CHEST - 1 VIEW SAME DAY  COMPARISON:  None  FINDINGS: Borderline cardiomegaly. Mild interstitial prominence bilaterally without convincing pulmonary edema. Hazy atelectasis or infiltrate in right middle lobe.  IMPRESSION: Mild interstitial prominence without convincing pulmonary edema. Hazy atelectasis or infiltrate in right middle lobe.   Electronically Signed   By: Lahoma Crocker M.D.   On: 07/28/2014  09:09    Telemetry: at 505am torsades/VT 15 beats No further VT Personally viewed.   Cardiac Studies:  Cath report from [facility] in system reviewed.   Assessment/Plan:   1. Anterolateral STEMI- late presentation. 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 Now pain free. On ASA and heparin. Nursing reports that he received Plavix at [facility] but  this is not documented anywhere in notes. Will need to verify. Will check platelet reactivity. Dr. Cyndia Bent aware of patient.   2. Severe ischemic cardiomyopathy/ cardiogenic shock. EF 15-20% with elevated EDP. Hypotensive. No metoprolol. Norepi IV. (Did not tolerate milrinone and dopamine per nursing) Co-ox 70.4 (good). No IABP. CVP low (7-1). Decrease norepi as tolerated. VT noted.   3. Tobacco abuse.   4. CKD stage 3. Creatinine 1.2 from 1.6 at [facility].  5. Mild mental retardation.   6. Ischemic MR. Moderate  7. Tobacco abuse - cessation.  8. Protein calorie malnutrition - enhance diet.    The patient is critically ill with multiple organ systems failure and requires high complexity decision making for assessment and support, frequent evaluation and titration of therapies, application of advanced monitoring technologies and extensive interpretation of multiple databases.  Maha Fischel, [place] 07/29/2014, 8:33 AM

## 2014-07-30 LAB — BASIC METABOLIC PANEL
ANION GAP: 14 (ref 5–15)
BUN: 30 mg/dL — ABNORMAL HIGH (ref 6–23)
CO2: 29 mEq/L (ref 19–32)
CREATININE: 1.28 mg/dL (ref 0.50–1.35)
Calcium: 8.7 mg/dL (ref 8.4–10.5)
Chloride: 92 mEq/L — ABNORMAL LOW (ref 96–112)
GFR calc non Af Amer: 58 mL/min — ABNORMAL LOW (ref >90)
GFR, EST AFRICAN AMERICAN: 67 mL/min — AB (ref >90)
Glucose, Bld: 90 mg/dL (ref 70–99)
POTASSIUM: 3.7 meq/L (ref 3.7–5.3)
Sodium: 135 mEq/L — ABNORMAL LOW (ref 137–147)

## 2014-07-30 LAB — CARBOXYHEMOGLOBIN
CARBOXYHEMOGLOBIN: 1.3 % (ref 0.5–1.5)
Methemoglobin: 0.9 % (ref 0.0–1.5)
O2 Saturation: 67.8 %
Total hemoglobin: 15.4 g/dL (ref 13.5–18.0)

## 2014-07-30 LAB — CBC
HCT: 44.3 % (ref 39.0–52.0)
Hemoglobin: 15.5 g/dL (ref 13.0–17.0)
MCH: 32.9 pg (ref 26.0–34.0)
MCHC: 35 g/dL (ref 30.0–36.0)
MCV: 94.1 fL (ref 78.0–100.0)
PLATELETS: 166 10*3/uL (ref 150–400)
RBC: 4.71 MIL/uL (ref 4.22–5.81)
RDW: 13.2 % (ref 11.5–15.5)
WBC: 7.5 10*3/uL (ref 4.0–10.5)

## 2014-07-30 LAB — HEPARIN LEVEL (UNFRACTIONATED)
Heparin Unfractionated: 0.9 IU/mL — ABNORMAL HIGH (ref 0.30–0.70)
Heparin Unfractionated: 0.44 IU/mL (ref 0.30–0.70)
Heparin Unfractionated: 0.57 IU/mL (ref 0.30–0.70)

## 2014-07-30 MED ORDER — MAGNESIUM HYDROXIDE 400 MG/5ML PO SUSP
30.0000 mL | Freq: Every day | ORAL | Status: DC | PRN
  Administered 2014-07-30 – 2014-08-01 (×2): 30 mL via ORAL
  Filled 2014-07-30 (×2): qty 30

## 2014-07-30 NOTE — Progress Notes (Signed)
ANTICOAGULATION CONSULT NOTE - Follow Up Consult  Pharmacy Consult for heparin Indication: CAD  Labs:  Recent Labs  07/28/14 0530  07/29/14 0500 07/30/14 0400 07/30/14 1600 07/30/14 2300  HGB 14.8  --  14.7 15.5  --   --   HCT 43.0  --  41.8 44.3  --   --   PLT 167  --  204 166  --   --   HEPARINUNFRC 0.10*  < > 0.54 0.90* 0.44 0.57  CREATININE 1.40*  --  1.26 1.28  --   --   TROPONINI 7.08*  --   --   --   --   --   < > = values in this interval not displayed.   Assessment/Plan:  63yo male remains therapeutic on heparin. Will continue gtt at current rate and confirm stable with am labs.   Wynona Neat, PharmD, BCPS  07/30/2014,11:39 PM

## 2014-07-30 NOTE — Progress Notes (Signed)
ANTICOAGULATION CONSULT NOTE - Follow Up Consult  Pharmacy Consult for Heparin Indication: CAD awaiting CABG  No Known Allergies  Patient Measurements: Height: 6' (182.9 cm) Weight: 142 lb 3.2 oz (64.5 kg) IBW/kg (Calculated) : 77.6 Heparin Dosing Weight: 67.3 kg  Vital Signs: Temp: 98 F (36.7 C) (12/06 0300) Temp Source: Oral (12/06 0300) BP: 91/68 mmHg (12/06 0600) Pulse Rate: 81 (12/05 2000)  Labs:  Recent Labs  07/27/14 1902 07/27/14 2258 07/28/14 0530  07/29/14 0030 07/29/14 0500 07/30/14 0400  HGB 15.0  --  14.8  --   --  14.7 15.5  HCT 44.7  --  43.0  --   --  41.8 44.3  PLT 184  --  167  --   --  204 166  LABPROT 15.8*  --   --   --   --   --   --   INR 1.25  --   --   --   --   --   --   HEPARINUNFRC  --   --  0.10*  < > 0.55 0.54 0.90*  CREATININE 1.40*  --  1.40*  --   --  1.26 1.28  TROPONINI 7.77* 6.68* 7.08*  --   --   --   --   < > = values in this interval not displayed.  Estimated Creatinine Clearance: 53.9 mL/min (by C-G formula based on Cr of 1.28).   Assessment: 63 y.o male presented to [facility] on 07/[facility]/14 with SOB, radiating back>>STEMI.Cath 12/3 with 3V CAD. Patient was transferred to [facility] for CABG evaluation.Plavix washout. Pharmacy consulted to start heparin at 9pm on 12/3 and Milrinone on 12/4. NO DOCUMENTATION OF PLAVIX ADMINISTRATION AT [facility].  Anticoagulation: CAD awaiting CABG. Heparin level 0.9 elevated this AM.. Hgb 15.5 up and plts 204>>166 down. P2Y12 test 171 (low).  Infectious Disease: Afebrile, WBC 7.5 better.  Cardiovascular: +FH of CAD, Severe ICM with EF 15-20%,Troponins positive X 3. HDL 33, LDL 84. BP only 86/51 with HR 81 this AM. Meds: ASA325, Lipitor 80, IV Lasix, Levophed drip (DA, Milrinone d/c'd)  Endocrinology: CBGs 90. TSH WNL  Gastrointestinal / Nutrition: AST elevated slightly at 58, Pantoprazole  Neurology: Mild mental retardation.Alprazolam prn, Ambien prn  Nephrology: CKD Stage 3. Scr 1.28   Pulmonary:  2L [place]  PTA Medication Issues:  Best Practices: Heparin  Goal of Therapy:  Heparin level 0.3-0.7 units/ml Monitor platelets by anticoagulation protocol: Yes   Plan:  1) Decrease IV heparin to 1000 units/hr 2) Will recheck heparin level in 6-8 hrs.    Timya Trimmer S. Alford Highland, PharmD, [facility] Clinical Staff Pharmacist Pager [phone]  Eilene Ghazi Stillinger 07/30/2014,7:46 AM

## 2014-07-30 NOTE — Progress Notes (Signed)
Utilization Review Completed.Normal Recinos T12/01/2014  

## 2014-07-30 NOTE — Progress Notes (Signed)
ANTICOAGULATION CONSULT NOTE - Follow Up Consult  Pharmacy Consult for Heparin Indication: CAD awaiting CABG  No Known Allergies  Patient Measurements: Height: 6' (182.9 cm) Weight: 142 lb 3.2 oz (64.5 kg) IBW/kg (Calculated) : 77.6 Heparin Dosing Weight: 67.3 kg  Vital Signs: Temp: 97.7 F (36.5 C) (12/06 1600) Temp Source: Oral (12/06 1600) BP: 100/66 mmHg (12/06 1600)  Labs:  Recent Labs  07/27/14 1902 07/27/14 2258 07/28/14 0530  07/29/14 0500 07/30/14 0400 07/30/14 1600  HGB 15.0  --  14.8  --  14.7 15.5  --   HCT 44.7  --  43.0  --  41.8 44.3  --   PLT 184  --  167  --  204 166  --   LABPROT 15.8*  --   --   --   --   --   --   INR 1.25  --   --   --   --   --   --   HEPARINUNFRC  --   --  0.10*  < > 0.54 0.90* 0.44  CREATININE 1.40*  --  1.40*  --  1.26 1.28  --   TROPONINI 7.77* 6.68* 7.08*  --   --   --   --   < > = values in this interval not displayed.  Estimated Creatinine Clearance: 53.9 mL/min (by C-G formula based on Cr of 1.28).   Assessment: 63 y.o male presented to [facility] on 07/26/14 with SOB, radiating back>>STEMI.Cath 12/3 with 3V CAD. Patient was transferred to [facility] for CABG evaluation andPlavix washout.  Heparin level was elevated this morning and rate was lowered. Now therapeutic on 1000 units/hr at 0.44 units/mL.  Goal of Therapy:  Heparin level 0.3-0.7 units/ml Monitor platelets by anticoagulation protocol: Yes   Plan:  1. Continue heparin at 1000 units/hr 2. Confirm level in 6h 3. Daily HL and CBC 4. Follow for s/s bleeding and CABG plans   Quentina Fronek D. Lasandra Batley, PharmD, BCPS Clinical Pharmacist Pager: [phone] 07/30/2014 4:39 PM

## 2014-07-30 NOTE — Progress Notes (Signed)
Patient ID: Carl [name], male   DOB: 1951/01/07, 63 y.o.   MRN: [id number]  [place] cardiology system is still down. Can't see the cath or echo yet.

## 2014-07-30 NOTE — Progress Notes (Addendum)
Subjective:  63 year old male with late presenting Anterolateral STEMI with mild mental retardation, smoker with cardiogenic shock, AKI improving.    FAMILY HISTORY: Remarkable for premature coronary artery disease. His father died of complications of myocardial infarction in his 18s. His brother had multiple cardiac stents.    Central line placed 12/4 Pressor support previously. Now off Norepi  Alert, mentioned no chest pain. No SOB. CVP-2  Objective:  Vital Signs in the last 24 hours: Temp:  [97.5 F (36.4 C)-98 F (36.7 C)] 98 F (36.7 C) (12/06 0300) Pulse Rate:  [81] 81 (12/05 2000) Resp:  [0-28] 17 (12/06 0600) BP: (89-92)/(53-68) 91/68 mmHg (12/06 0600) SpO2:  [61 %-100 %] 96 % (12/06 0400) Arterial Line BP: (74-125)/(48-74) 86/51 mmHg (12/06 0600) Weight:  [142 lb 3.2 oz (64.5 kg)] 142 lb 3.2 oz (64.5 kg) (12/06 0404)  Intake/Output from previous day: 12/05 0701 - 12/06 0700 In: 1604.3 [P.O.:940; I.V.:664.3] Out: 2700 [Urine:2700]   Physical Exam: General: Well developed, well nourished, in no acute distress. Strong body odor.  Head: Normocephalic, atraumatic, sclera non-icteric, oropharynx is clear Neck: Negative for carotid bruits. JVD is elevated. No adenopathy Lungs: Clear bilaterally to auscultation without wheezes, rales, or rhonchi. Decreased BS in left base. Heart: RRR S1 S2 with harsh grade 3/6 systolic murmur at the apex. Left subclavian line.  Abdomen: Soft, non-tender, non-distended with normoactive bowel sounds. No hepatomegaly. No rebound/guarding. No obvious abdominal masses. Msk: Strength and tone appears normal for age. Extremities: No clubbing, cyanosis or edema. Distal pedal pulses are 2+ and equal bilaterally. No groin hematoma at cath site.  Neuro: Alert and oriented X 3. Moves all extremities spontaneously. Psych: Responds to questions appropriately with a normal affect.    Lab Results:  Recent Labs  07/29/14 0500  07/30/14 0400  WBC 11.6* 7.5  HGB 14.7 15.5  PLT 204 166    Recent Labs  07/29/14 0500 07/30/14 0400  NA 136* 135*  K 3.8 3.7  CL 93* 92*  CO2 28 29  GLUCOSE 132* 90  BUN 29* 30*  CREATININE 1.26 1.28    Recent Labs  07/27/14 2258 07/28/14 0530  TROPONINI 6.68* 7.08*   Hepatic Function Panel  Recent Labs  07/27/14 1902  PROT 6.5  ALBUMIN 3.5  AST 58*  ALT 41  ALKPHOS 64  BILITOT 1.2    Recent Labs  07/28/14 0530  CHOL 129   No results for input(s): PROTIME in the last 72 hours.  Imaging: Dg Chest Port 1 View  07/28/2014   CLINICAL DATA:  Central line placement  EXAM: PORTABLE CHEST - 1 VIEW  COMPARISON:  08/07/2014 at 0856 hr  FINDINGS: Cardiomegaly with mild to moderate interstitial edema. Suspected layering right pleural effusion. No pneumothorax.  Left subclavian venous catheter terminates at the cavoatrial junction.  IMPRESSION: Cardiomegaly with mild to moderate interstitial edema.  Suspected layering right pleural effusion.  Left subclavian venous catheter terminates at the cavoatrial junction.   Electronically Signed   By: Julian Hy M.D.   On: 07/28/2014 20:48   Dg Chest Port 1v Same Day  07/28/2014   CLINICAL DATA:  Shortness of breath, previous smoker  EXAM: PORTABLE CHEST - 1 VIEW SAME DAY  COMPARISON:  None  FINDINGS: Borderline cardiomegaly. Mild interstitial prominence bilaterally without convincing pulmonary edema. Hazy atelectasis or infiltrate in right middle lobe.  IMPRESSION: Mild interstitial prominence without convincing pulmonary edema. Hazy atelectasis or infiltrate in right middle lobe.   Electronically Signed  By: Lahoma Crocker M.D.   On: 07/28/2014 09:09    Telemetry: at 505am torsades/VT 15 beats No further VT Personally viewed.   Cardiac Studies:  Cath report from [facility] in system reviewed.   Assessment/Plan:   1. Anterolateral STEMI- late presentation. 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 Now pain free. On ASA and heparin.  Received Plavix at [place]  - platelet reactivity 171.  - Dr. Cyndia Bent consulted, waiting on films to review. Merge issue.  2. Severe ischemic cardiomyopathy/ cardiogenic shock. EF 15-20% with elevated EDP. Hypotensive. No metoprolol. Norepi IV has been weaned on 12/5 to off. (Did not tolerate milrinone and dopamine per nursing) Co-ox 67-70.4 (good overall output). No IABP. CVP low (7-1).  VT noted 12/5.   - will hold lasix IV 40 today (lungs clear, CVP 2)  3. Tobacco abuse.   4. CKD stage 3. Creatinine 1.2 from 1.6 at [facility].  5. Mild mental retardation.   6. Ischemic MR. Moderate (murmur)  7. Tobacco abuse - cessation.  8. Protein calorie malnutrition - enhance diet.    The patient is critically ill with multiple organ systems failure and requires high complexity decision making for assessment and support, frequent evaluation and titration of therapies, application of advanced monitoring technologies and extensive interpretation of multiple databases.  Nataly Pacifico, [place] 07/30/2014, 8:17 AM

## 2014-07-31 ENCOUNTER — Other Ambulatory Visit: Payer: Self-pay | Admitting: *Deleted

## 2014-07-31 DIAGNOSIS — I2510 Atherosclerotic heart disease of native coronary artery without angina pectoris: Secondary | ICD-10-CM

## 2014-07-31 LAB — CBC
HEMATOCRIT: 43.6 % (ref 39.0–52.0)
Hemoglobin: 15.1 g/dL (ref 13.0–17.0)
MCH: 33 pg (ref 26.0–34.0)
MCHC: 34.6 g/dL (ref 30.0–36.0)
MCV: 95.2 fL (ref 78.0–100.0)
Platelets: 168 10*3/uL (ref 150–400)
RBC: 4.58 MIL/uL (ref 4.22–5.81)
RDW: 13.3 % (ref 11.5–15.5)
WBC: 5 10*3/uL (ref 4.0–10.5)

## 2014-07-31 LAB — CARBOXYHEMOGLOBIN
CARBOXYHEMOGLOBIN: 1 % (ref 0.5–1.5)
METHEMOGLOBIN: 0.9 % (ref 0.0–1.5)
O2 Saturation: 74.9 %
Total hemoglobin: 15.5 g/dL (ref 13.5–18.0)

## 2014-07-31 LAB — BASIC METABOLIC PANEL
ANION GAP: 16 — AB (ref 5–15)
BUN: 29 mg/dL — AB (ref 6–23)
CHLORIDE: 95 meq/L — AB (ref 96–112)
CO2: 25 mEq/L (ref 19–32)
Calcium: 8.6 mg/dL (ref 8.4–10.5)
Creatinine, Ser: 1.04 mg/dL (ref 0.50–1.35)
GFR calc Af Amer: 86 mL/min — ABNORMAL LOW (ref >90)
GFR calc non Af Amer: 74 mL/min — ABNORMAL LOW (ref >90)
Glucose, Bld: 110 mg/dL — ABNORMAL HIGH (ref 70–99)
Potassium: 4.3 mEq/L (ref 3.7–5.3)
Sodium: 136 mEq/L — ABNORMAL LOW (ref 137–147)

## 2014-07-31 LAB — HEPARIN LEVEL (UNFRACTIONATED): HEPARIN UNFRACTIONATED: 0.51 [IU]/mL (ref 0.30–0.70)

## 2014-07-31 MED ORDER — CARVEDILOL 3.125 MG PO TABS
1.5600 mg | ORAL_TABLET | Freq: Two times a day (BID) | ORAL | Status: DC
  Administered 2014-07-31 – 2014-08-01 (×3): 1.56 mg via ORAL
  Filled 2014-07-31 (×5): qty 0.5

## 2014-07-31 MED ORDER — SODIUM CHLORIDE 0.9 % IJ SOLN: 10.0000 mL | INTRAMUSCULAR | Status: DC | PRN

## 2014-07-31 MED ORDER — ENOXAPARIN SODIUM 80 MG/0.8ML ~~LOC~~ SOLN
70.0000 mg | Freq: Two times a day (BID) | SUBCUTANEOUS | Status: AC
  Administered 2014-07-31 – 2014-08-02 (×6): 70 mg via SUBCUTANEOUS
  Filled 2014-07-31 (×6): qty 0.8

## 2014-07-31 MED ORDER — SODIUM CHLORIDE 0.9 % IJ SOLN
10.0000 mL | Freq: Two times a day (BID) | INTRAMUSCULAR | Status: DC
  Administered 2014-07-31 – 2014-08-02 (×5): 10 mL
  Administered 2014-08-02: 20 mL

## 2014-07-31 NOTE — Progress Notes (Signed)
ANTICOAGULATION CONSULT NOTE - Follow Up Consult  Pharmacy Consult for  lovenox Indication: CAD for CABG  No Known Allergies  Patient Measurements: Height: 6' (182.9 cm) Weight: 149 lb 14.6 oz (68 kg) IBW/kg (Calculated) : 77.6   Vital Signs: Temp: 98.3 F (36.8 C) (12/07 0400) Temp Source: Oral (12/07 0400) BP: 136/79 mmHg (12/07 0800)  Labs:  Recent Labs  07/29/14 0500 07/30/14 0400 07/30/14 1600 07/30/14 2300 07/31/14 0415  HGB 14.7 15.5  --   --  15.1  HCT 41.8 44.3  --   --  43.6  PLT 204 166  --   --  168  HEPARINUNFRC 0.54 0.90* 0.44 0.57 0.51  CREATININE 1.26 1.28  --   --  1.04    Estimated Creatinine Clearance: 69.9 mL/min (by C-G formula based on Cr of 1.04).   Medications:  Scheduled:  . antiseptic oral rinse  7 mL Mouth Rinse BID  . aspirin EC  325 mg Oral Daily  . atorvastatin  80 mg Oral q1800  . enoxaparin (LOVENOX) injection  70 mg Subcutaneous Q12H  . feeding supplement (RESOURCE BREEZE)  1 Container Oral TID BM  . pantoprazole  40 mg Oral Q0600  . THROMBI-PAD  1 each Topical Once    Assessment: 63 yo male with STEMI s/p cath and noted with mutivessel CAD on heparin and awaiting CABG. Pharmacy has been consulted to transition to lovenox. Hg/Hct= 15.1/43.6, plt= 168, SCr= 1.04 and CrCl ~ 70. Heparin level noted at goal this am.  Goal of Therapy:  Monitor platelets by anticoagulation protocol: Yes   Plan:  -Stop heparin -Lovenox 70mg  [place] q12h -CBC every 3 days -Will follow CABG plans  Hildred Laser, Pharm D 07/31/2014 8:46 AM

## 2014-07-31 NOTE — Plan of Care (Signed)
Problem: Phase I Progression Outcomes Goal: Hemodynamically stable Outcome: Completed/Met Date Met:  07/31/14     

## 2014-07-31 NOTE — Progress Notes (Signed)
Echocardiogram 2D Echocardiogram has been performed.  Carl [name] 07/31/2014, 4:51 PM

## 2014-07-31 NOTE — Care Management Note (Addendum)
    Page 1 of 2   08/17/2014     10:15:42 AM CARE MANAGEMENT NOTE 08/17/2014  Patient:  Carl [name], Carl [name]   Account Number:  [id number]  Date Initiated:  07/31/2014  Documentation initiated by:  Elissa Hefty  Subjective/Objective Assessment:   adm w stemi     Action/Plan:   lives w fam, pcp dr v handy   Anticipated DC Date:  08/15/2014   Anticipated DC Plan:  HOME/SELF CARE  In-house referral  Clinical Social Worker      DC Planning Services  CM consult      Choice offered to / List presented to:             Status of service:  Completed, signed off Medicare Important Message given?  YES (If response is "NO", the following Medicare IM given date fields will be blank) Date Medicare IM given:  07/31/2014 Medicare IM given by:  Elissa Hefty Date Additional Medicare IM given:  08/17/2014 Additional Medicare IM given by:  [place]  Discharge Disposition:  [place]  Per UR Regulation:  Reviewed for med. necessity/level of care/duration of stay  If discussed at [place] of Stay Meetings, dates discussed:   08/01/2014  08/03/2014  08/08/2014  08/10/2014  08/15/2014  08/17/2014    Comments:  Contact:  Bellard,Mike Brother   [place]     [facility] Daughter   [phone]  08-17-14 [place], [place] - [phone] Insurance denying.  Physician appealing today after surgery.  Family notified that SNF is plan A.  If unable to get approval for SNF - Family states Mom is home 24/7 but unable to assist with movement etc, but brother, sister in law, and daughter are available over holidays for assistance.  Neighbors are also off over the holidays and willing to come in and assist.  Will need HH RN, PT, Nurse aide at home.  Will need orders for if goes home.  CM will continue to follow.  08-14-14 10:50am Luz Lex, RNBSN [phone] Plan for dc to SNF.  SW working with.  08-07-14 3:30pm  Luz Lex, RNBSN [phone] PT recommending SNF.  SW consult placed.

## 2014-07-31 NOTE — Progress Notes (Signed)
       Patient Name: Carl [name] Date of Encounter: 07/31/2014    SUBJECTIVE: Denies dyspnea and angina. Inquisitive about procedure, "just what will they be doing?"  TELEMETRY:  NSR with no recurrent [place] Filed Vitals:   07/31/14 0400 07/31/14 0500 07/31/14 0600 07/31/14 0800  BP:    136/79  Pulse:      Temp: 98.3 F (36.8 C)     TempSrc: Oral     Resp: 16 13 15 17   Height:      Weight:  149 lb 14.6 oz (68 kg)    SpO2:        Intake/Output Summary (Last 24 hours) at 07/31/14 0845 Last data filed at 07/31/14 0700  Gross per 24 hour  Intake 690.67 ml  Output   1100 ml  Net -409.33 ml   LABS: Basic Metabolic Panel:  Recent Labs  07/30/14 0400 07/31/14 0415  NA 135* 136*  K 3.7 4.3  CL 92* 95*  CO2 29 25  GLUCOSE 90 110*  BUN 30* 29*  CREATININE 1.28 1.04  CALCIUM 8.7 8.6   CBC:  Recent Labs  07/30/14 0400 07/31/14 0415  WBC 7.5 5.0  HGB 15.5 15.1  HCT 44.3 43.6  MCV 94.1 95.2  PLT 166 168    Radiology/Studies:  No new data  Physical Exam: Blood pressure 136/79, pulse 81, temperature 98.3 F (36.8 C), temperature source Oral, resp. rate 17, height 6' (1.829 m), weight 149 lb 14.6 oz (68 kg), SpO2 98 %. Weight change: 7 lb 11.5 oz (3.5 kg)  Wt Readings from Last 3 Encounters:  07/31/14 149 lb 14.6 oz (68 kg)  11/21/13 170 lb (77.111 kg)   Faint basilar rales. S4 gallop and soft apical systolic Murmur Abdomen soft  ASSESSMENT:  1. Multivessel CAD needing CABG 2. Cardiogenic shock has resolved 3. Pulmonary congestion has resolved 4. Acute kidney injury improving. 5. Learning impaired  Plan:  1. Convert heparin to Lovenox 2. Ambulate as tolerated 3. Beta blocker, LA nitrates and ? Angiotensin blockade as tolerated by BP. 4. Surgery once all are satisfied with antiplatelet effect and clinical condition. 5. P2Y12 level today  Signed, Sinclair Grooms 07/31/2014, 8:45 AM

## 2014-07-31 NOTE — Progress Notes (Signed)
  Subjective:  No complaints  Objective: Vital signs in last 24 hours: Temp:  [97.7 F (36.5 C)-98.3 F (36.8 C)] 97.8 F (36.6 C) (12/07 1100) Pulse Rate:  [83] 83 (12/07 1100) Cardiac Rhythm:  [-] Normal sinus rhythm (12/07 0800) Resp:  [13-27] 21 (12/07 1500) BP: (91-136)/(46-80) 104/46 mmHg (12/07 1500) SpO2:  [96 %-98 %] 98 % (12/06 2000) Arterial Line BP: (69-102)/(39-65) 96/58 mmHg (12/07 1500) Weight:  [68 kg (149 lb 14.6 oz)] 68 kg (149 lb 14.6 oz) (12/07 0500)  Hemodynamic parameters for last 24 hours: CVP:  [1 mmHg-2 mmHg] 1 mmHg  Intake/Output from previous day: 12/06 0701 - 12/07 0700 In: 952.7 [P.O.:480; I.V.:472.7] Out: 1100 [Urine:1100] Intake/Output this shift: Total I/O In: 440 [P.O.:360; I.V.:80] Out: 400 [Urine:400]  General appearance: alert and cooperative Neurologic: intact Heart: regular rate and rhythm and systolic murmur: holosystolic 2/6,  at apex Lungs: clear to auscultation bilaterally Extremities: extremities normal, atraumatic, no cyanosis or edema  Lab Results:  Recent Labs  07/30/14 0400 07/31/14 0415  WBC 7.5 5.0  HGB 15.5 15.1  HCT 44.3 43.6  PLT 166 168   BMET:  Recent Labs  07/30/14 0400 07/31/14 0415  NA 135* 136*  K 3.7 4.3  CL 92* 95*  CO2 29 25  GLUCOSE 90 110*  BUN 30* 29*  CREATININE 1.28 1.04  CALCIUM 8.7 8.6    PT/INR: No results for input(s): LABPROT, INR in the last 72 hours. ABG    Component Value Date/Time   O2SAT 74.9 07/31/2014 0442   CBG (last 3)  No results for input(s): GLUCAP in the last 72 hours.  Assessment/Plan:  I was able to view his cath films from [place] in the cloud today. He has severe 3 vessel coronary disease with graftable vessels. His EF was 15% by echo at [facility] with moderate MR. He had a large anteroseptal MI on presentation with cardiogenic shock but has tuned up nicely. I will repeat his echo now to reassess the LV and MR. He still has an MR murmur on exam. I will  tentatively do surgery Thursday afternoon. Discussed with the patient and his brother by telephone.   LOS: 4 days    BARTLE,BRYAN K 07/31/2014

## 2014-07-31 NOTE — Plan of Care (Signed)
Problem: Phase II Progression Outcomes Goal: Tolerating diet Outcome: Completed/Met Date Met:  07/31/14     

## 2014-07-31 NOTE — Clinical Documentation Improvement (Signed)
  Patient admitted with STEMI and Cardiogenic Shock.  EF 15% per current medical record.  Patient received IV Lasix 40 mg starting 07/27/14 to 07/30/14 per MAR.  Possible Clinical Conditions:   - Acute Systolic Heart Failure   - Other Condition   - Unable to Clinically Determine  Thank You, Erling Conte ,RN Clinical Documentation Specialist:  [phone] [place] Information Management

## 2014-08-01 DIAGNOSIS — Z01810 Encounter for preprocedural cardiovascular examination: Secondary | ICD-10-CM

## 2014-08-01 DIAGNOSIS — I959 Hypotension, unspecified: Secondary | ICD-10-CM

## 2014-08-01 DIAGNOSIS — I5023 Acute on chronic systolic (congestive) heart failure: Secondary | ICD-10-CM

## 2014-08-01 DIAGNOSIS — I5021 Acute systolic (congestive) heart failure: Secondary | ICD-10-CM

## 2014-08-01 LAB — PLATELET INHIBITION P2Y12: Platelet Function  P2Y12: 186 [PRU] — ABNORMAL LOW (ref 194–418)

## 2014-08-01 MED ORDER — CARVEDILOL 3.125 MG PO TABS
3.1250 mg | ORAL_TABLET | Freq: Two times a day (BID) | ORAL | Status: DC
  Administered 2014-08-02: 3.125 mg via ORAL
  Filled 2014-08-01 (×6): qty 1

## 2014-08-01 NOTE — Progress Notes (Signed)
Subjective:  No chest pain or dyspnea  Objective: Vital signs in last 24 hours: Temp:  [98.2 F (36.8 C)-98.8 F (37.1 C)] 98.8 F (37.1 C) (12/08 1200) Pulse Rate:  [75] 75 (12/08 0441) Cardiac Rhythm:  [-] Normal sinus rhythm (12/08 0800) Resp:  [11-25] 13 (12/08 1400) BP: (80-104)/(46-69) 83/54 mmHg (12/08 1200) SpO2:  [96 %-100 %] 99 % (12/08 1200) Arterial Line BP: (96)/(58) 96/58 mmHg (12/07 1500) Weight:  [66 kg (145 lb 8.1 oz)] 66 kg (145 lb 8.1 oz) (12/08 0400)  Hemodynamic parameters for last 24 hours: CVP:  [1 mmHg-2 mmHg] 2 mmHg  Intake/Output from previous day: 12/07 0701 - 12/08 0700 In: 194 [P.O.:720; I.V.:150] Out: 1100 [Urine:1100] Intake/Output this shift: Total I/O In: 240 [P.O.:240] Out: 650 [Urine:650]    Lab Results:  Recent Labs  07/30/14 0400 07/31/14 0415  WBC 7.5 5.0  HGB 15.5 15.1  HCT 44.3 43.6  PLT 166 168   BMET:  Recent Labs  07/30/14 0400 07/31/14 0415  NA 135* 136*  K 3.7 4.3  CL 92* 95*  CO2 29 25  GLUCOSE 90 110*  BUN 30* 29*  CREATININE 1.28 1.04  CALCIUM 8.7 8.6    PT/INR: No results for input(s): LABPROT, INR in the last 72 hours. ABG    Component Value Date/Time   O2SAT 74.9 07/31/2014 0442   CBG (last 3)  No results for input(s): GLUCAP in the last 72 hours.  Assessment/Plan:  He remains hemodynamically stable. His echo was apparently done yesterday according to chart but it has not been loaded into the [place] system yet for me to view and has not been read yet. I am going to plan to do surgery Thursday am. I discussed the operative procedure with the patient and family including alternatives, benefits and risks; including but not limited to bleeding, blood transfusion, infection, stroke, myocardial infarction, graft failure, heart block requiring a permanent pacemaker, organ dysfunction, and death.  Carl [name] understands and agrees to proceed.     LOS: 5 days    BARTLE,BRYAN K 08/01/2014

## 2014-08-01 NOTE — Progress Notes (Signed)
CARDIAC REHAB PHASE I   PRE:  Rate/Rhythm: 75 SR    BP: sitting 83/55    SaO2: 96 RA  MODE:  Ambulation: 290 ft   POST:  Rate/Rhythm: 88 SR    BP: sitting 91/67     SaO2: 96 RA  Pt able to stand and walk fairly independently, assist x1 for weakness. Denied problems when asked. Very quiet. Only c/o was back pain which sounds chronic. Return to recliner. Will f/u. [phone]   Carl [name] CES, ACSM 08/01/2014 2:15 PM

## 2014-08-01 NOTE — Progress Notes (Addendum)
       Patient Name: Carl [name] Date of Encounter: 08/01/2014    SUBJECTIVE:No complaints  TELEMETRY:  NSR Filed Vitals:   08/01/14 0441 08/01/14 0500 08/01/14 0600 08/01/14 0800  BP: 87/63   88/62  Pulse: 75     Temp: 98.2 F (36.8 C)     TempSrc: Oral     Resp: 14 13 11 21   Height:      Weight:      SpO2: 98%       Intake/Output Summary (Last 24 hours) at 08/01/14 0857 Last data filed at 08/01/14 0800  Gross per 24 hour  Intake    850 ml  Output   1300 ml  Net   -450 ml   LABS: Basic Metabolic Panel:  Recent Labs  07/30/14 0400 07/31/14 0415  NA 135* 136*  K 3.7 4.3  CL 92* 95*  CO2 29 25  GLUCOSE 90 110*  BUN 30* 29*  CREATININE 1.28 1.04  CALCIUM 8.7 8.6   CBC:  Recent Labs  07/30/14 0400 07/31/14 0415  WBC 7.5 5.0  HGB 15.5 15.1  HCT 44.3 43.6  MCV 94.1 95.2  PLT 166 168   P2Y12: 186  Radiology/Studies:  No new data  Physical Exam: Blood pressure 88/62, pulse 75, temperature 98.2 F (36.8 C), temperature source Oral, resp. rate 21, height 6' (1.829 m), weight 145 lb 8.1 oz (66 kg), SpO2 98 %. Weight change: -4 lb 6.5 oz (-2 kg)  Wt Readings from Last 3 Encounters:  08/01/14 145 lb 8.1 oz (66 kg)  11/21/13 170 lb (77.111 kg)    Soft 1/6 systolic apical murmur No edema Chest clear Neck veins flat  ASSESSMENT:  1. 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 Improved CHF. 2. Low BP but asymptomatic, limiting med titration 3. Anticoagulation with heparin and persistent antiplatelet effect of plavix (P2Y12 = 180)  Plan:  1. Anticoagulation will need to be stopped by TCTS prior  to surgery 2. Transfer to stepdown 3. Ambulate  Signed, Sinclair Grooms 08/01/2014, 8:57 AM

## 2014-08-01 NOTE — Plan of Care (Signed)
Problem: Phase III Progression Outcomes Goal: Up to chair & ambulate with assist (TID) Outcome: Completed/Met Date Met:  08/01/14 Goal: Vascular site scale level 0 - I Vascular Site Scale Level 0: No bruising/bleeding/hematoma Level I (Mild): Bruising/Ecchymosis, minimal bleeding/ooozing, palpable hematoma < 3 cm Level II (Moderate): Bleeding not affecting hemodynamic parameters, pseudoaneurysm, palpable hematoma > 3 cm Level III (Severe) Bleeding which affects hemodynamic parameters or retroperitoneal hemorrhage  Outcome: Completed/Met Date Met:  08/01/14 Goal: Tolerating diet Outcome: Completed/Met Date Met:  08/01/14

## 2014-08-01 NOTE — Progress Notes (Addendum)
Pre-op Cardiac Surgery  Carotid Findings:  Bilateral:  1-39% ICA stenosis.  Vertebral artery flow is antegrade.    Landry Mellow, RDMS, RVT 08/01/2014   Upper Extremity Right Left  Brachial Pressures 87-Triphasic 89-Triphasic  Radial Waveforms Triphasic Triphasic  Ulnar Waveforms Triphasic Triphasic  Palmar Arch (Allen's Test) Signal is unaffected with radial compression, obliterates with radial compression. Signal is unaffected with radial compression, obliterates with radial compression.   Findings:   Palpable pedal pulses bilaterally.  08/02/2014 4:06 PM Maudry Mayhew, RVT, RDCS, RDMS

## 2014-08-02 ENCOUNTER — Ambulatory Visit: Payer: Self-pay | Admitting: Cardiovascular Disease

## 2014-08-02 ENCOUNTER — Inpatient Hospital Stay (HOSPITAL_COMMUNITY): Payer: Medicare HMO

## 2014-08-02 DIAGNOSIS — Z01810 Encounter for preprocedural cardiovascular examination: Secondary | ICD-10-CM

## 2014-08-02 LAB — PULMONARY FUNCTION TEST
FEF 25-75 Post: 3.39 L/sec
FEF 25-75 Pre: 4.26 L/sec
FEF2575-%CHANGE-POST: -20 %
FEF2575-%PRED-POST: 120 %
FEF2575-%PRED-PRE: 150 %
FEV1-%Change-Post: -4 %
FEV1-%PRED-PRE: 67 %
FEV1-%Pred-Post: 64 %
FEV1-PRE: 2.37 L
FEV1-Post: 2.28 L
FEV1FVC-%Change-Post: -9 %
FEV1FVC-%PRED-PRE: 123 %
FEV6-%Change-Post: 13 %
FEV6-%PRED-POST: 61 %
FEV6-%Pred-Pre: 53 %
FEV6-POST: 2.72 L
FEV6-Pre: 2.39 L
FEV6FVC-%Pred-Post: 105 %
FEV6FVC-%Pred-Pre: 105 %
FVC-%Change-Post: 5 %
FVC-%Pred-Post: 58 %
FVC-%Pred-Pre: 54 %
FVC-Post: 2.72 L
FVC-Pre: 2.57 L
POST FEV1/FVC RATIO: 84 %
Post FEV6/FVC ratio: 100 %
Pre FEV1/FVC ratio: 92 %
Pre FEV6/FVC Ratio: 100 %

## 2014-08-02 LAB — BASIC METABOLIC PANEL
Anion gap: 11 (ref 5–15)
BUN: 27 mg/dL — ABNORMAL HIGH (ref 6–23)
CO2: 25 meq/L (ref 19–32)
Calcium: 8.9 mg/dL (ref 8.4–10.5)
Chloride: 98 mEq/L (ref 96–112)
Creatinine, Ser: 1.08 mg/dL (ref 0.50–1.35)
GFR calc Af Amer: 82 mL/min — ABNORMAL LOW (ref >90)
GFR calc non Af Amer: 71 mL/min — ABNORMAL LOW (ref >90)
Glucose, Bld: 106 mg/dL — ABNORMAL HIGH (ref 70–99)
Potassium: 4.7 mEq/L (ref 3.7–5.3)
Sodium: 134 mEq/L — ABNORMAL LOW (ref 137–147)

## 2014-08-02 LAB — CBC
HCT: 45 % (ref 39.0–52.0)
Hemoglobin: 15.2 g/dL (ref 13.0–17.0)
MCH: 31.8 pg (ref 26.0–34.0)
MCHC: 33.8 g/dL (ref 30.0–36.0)
MCV: 94.1 fL (ref 78.0–100.0)
Platelets: 178 10*3/uL (ref 150–400)
RBC: 4.78 MIL/uL (ref 4.22–5.81)
RDW: 13.2 % (ref 11.5–15.5)
WBC: 4.1 10*3/uL (ref 4.0–10.5)

## 2014-08-02 LAB — SURGICAL PCR SCREEN
MRSA, PCR: NEGATIVE
STAPHYLOCOCCUS AUREUS: NEGATIVE

## 2014-08-02 LAB — TYPE AND SCREEN
ABO/RH(D): O POS
Antibody Screen: NEGATIVE

## 2014-08-02 LAB — ABO/RH: ABO/RH(D): O POS

## 2014-08-02 MED ORDER — METOPROLOL TARTRATE 12.5 MG HALF TABLET
12.5000 mg | ORAL_TABLET | Freq: Once | ORAL | Status: AC
  Administered 2014-08-03: 12.5 mg via ORAL
  Filled 2014-08-02: qty 1

## 2014-08-02 MED ORDER — BISACODYL 5 MG PO TBEC
5.0000 mg | DELAYED_RELEASE_TABLET | Freq: Once | ORAL | Status: AC
  Administered 2014-08-02: 5 mg via ORAL
  Filled 2014-08-02: qty 1

## 2014-08-02 MED ORDER — DEXTROSE 5 % IV SOLN
1.5000 g | INTRAVENOUS | Status: AC
  Administered 2014-08-03: .75 g via INTRAVENOUS
  Administered 2014-08-03: 1.5 g via INTRAVENOUS
  Filled 2014-08-02 (×2): qty 1.5

## 2014-08-02 MED ORDER — VANCOMYCIN HCL 10 G IV SOLR
1250.0000 mg | INTRAVENOUS | Status: AC
  Administered 2014-08-03: 1250 mg via INTRAVENOUS
  Filled 2014-08-02: qty 1250

## 2014-08-02 MED ORDER — SODIUM CHLORIDE 0.9 % IV SOLN
INTRAVENOUS | Status: AC
  Administered 2014-08-03: 1 [IU]/h via INTRAVENOUS
  Filled 2014-08-02: qty 2.5

## 2014-08-02 MED ORDER — TEMAZEPAM 15 MG PO CAPS: 15.0000 mg | ORAL_CAPSULE | Freq: Once | ORAL | Status: AC | PRN

## 2014-08-02 MED ORDER — PLASMA-LYTE 148 IV SOLN
INTRAVENOUS | Status: AC
  Administered 2014-08-03: 500 mL
  Filled 2014-08-02: qty 2.5

## 2014-08-02 MED ORDER — SODIUM CHLORIDE 0.9 % IV SOLN
INTRAVENOUS | Status: DC
  Filled 2014-08-02: qty 30

## 2014-08-02 MED ORDER — MAGNESIUM SULFATE 50 % IJ SOLN
40.0000 meq | INTRAMUSCULAR | Status: DC
  Filled 2014-08-02: qty 10

## 2014-08-02 MED ORDER — DEXTROSE 5 % IV SOLN
750.0000 mg | INTRAVENOUS | Status: DC
  Filled 2014-08-02: qty 750

## 2014-08-02 MED ORDER — POTASSIUM CHLORIDE 2 MEQ/ML IV SOLN
80.0000 meq | INTRAVENOUS | Status: DC
  Filled 2014-08-02: qty 40

## 2014-08-02 MED ORDER — SODIUM CHLORIDE 0.9 % IV SOLN
INTRAVENOUS | Status: AC
  Administered 2014-08-03: 69.8 mL/h via INTRAVENOUS
  Filled 2014-08-02: qty 40

## 2014-08-02 MED ORDER — EPINEPHRINE HCL 1 MG/ML IJ SOLN
0.0000 ug/min | INTRAVENOUS | Status: DC
  Filled 2014-08-02: qty 4

## 2014-08-02 MED ORDER — CHLORHEXIDINE GLUCONATE CLOTH 2 % EX PADS
6.0000 | MEDICATED_PAD | Freq: Once | CUTANEOUS | Status: AC
  Administered 2014-08-02: 6 via TOPICAL

## 2014-08-02 MED ORDER — DOPAMINE-DEXTROSE 3.2-5 MG/ML-% IV SOLN
0.0000 ug/kg/min | INTRAVENOUS | Status: DC
  Filled 2014-08-02: qty 250

## 2014-08-02 MED ORDER — CHLORHEXIDINE GLUCONATE CLOTH 2 % EX PADS
6.0000 | MEDICATED_PAD | Freq: Once | CUTANEOUS | Status: AC
  Administered 2014-08-03: 6 via TOPICAL

## 2014-08-02 MED ORDER — DIAZEPAM 5 MG PO TABS
5.0000 mg | ORAL_TABLET | Freq: Once | ORAL | Status: AC
  Administered 2014-08-03: 5 mg via ORAL
  Filled 2014-08-02: qty 1

## 2014-08-02 MED ORDER — PHENYLEPHRINE HCL 10 MG/ML IJ SOLN
30.0000 ug/min | INTRAVENOUS | Status: AC
  Administered 2014-08-03: 25 ug/min via INTRAVENOUS
  Filled 2014-08-02: qty 2

## 2014-08-02 MED ORDER — ALBUTEROL SULFATE (2.5 MG/3ML) 0.083% IN NEBU
2.5000 mg | INHALATION_SOLUTION | Freq: Once | RESPIRATORY_TRACT | Status: AC
  Administered 2014-08-02: 2.5 mg via RESPIRATORY_TRACT

## 2014-08-02 MED ORDER — DEXMEDETOMIDINE HCL IN NACL 400 MCG/100ML IV SOLN
0.1000 ug/kg/h | INTRAVENOUS | Status: AC
  Administered 2014-08-03: 0.2 ug/kg/h via INTRAVENOUS
  Filled 2014-08-02: qty 100

## 2014-08-02 MED ORDER — NITROGLYCERIN IN D5W 200-5 MCG/ML-% IV SOLN
2.0000 ug/min | INTRAVENOUS | Status: AC
  Administered 2014-08-03: 5 ug/min via INTRAVENOUS
  Filled 2014-08-02: qty 250

## 2014-08-02 NOTE — Progress Notes (Signed)
Subjective:  No chest pain or shortness of breath. Only complaint is of low back pain which is a chronic problem for him.  Objective: Vital signs in last 24 hours: Temp:  [97.4 F (36.3 C)-97.9 F (36.6 C)] 97.9 F (36.6 C) (12/09 1952) Pulse Rate:  [71-89] 71 (12/09 1137) Cardiac Rhythm:  [-] Normal sinus rhythm (12/09 1946) Resp:  [16-23] 21 (12/09 1946) BP: (81-96)/(57-72) 89/57 mmHg (12/09 1946) SpO2:  [94 %-97 %] 96 % (12/09 1946) Weight:  [65.9 kg (145 lb 4.5 oz)] 65.9 kg (145 lb 4.5 oz) (12/09 0317)  Hemodynamic parameters for last 24 hours: CVP:  [1 mmHg-6 mmHg] 1 mmHg  Intake/Output from previous day: 12/08 0701 - 12/09 0700 In: 960 [P.O.:960] Out: 1300 [Urine:1300] Intake/Output this shift: Total I/O In: -  Out: 250 [Urine:250]  General appearance: alert and cooperative Heart: regular rate and rhythm, 1/6 systolic murmur at apex Lungs: clear to auscultation bilaterally   Lab Results:  Recent Labs  07/31/14 0415 08/02/14 0232  WBC 5.0 4.1  HGB 15.1 15.2  HCT 43.6 45.0  PLT 168 178   BMET:  Recent Labs  07/31/14 0415 08/02/14 0232  NA 136* 134*  K 4.3 4.7  CL 95* 98  CO2 25 25  GLUCOSE 110* 106*  BUN 29* 27*  CREATININE 1.04 1.08  CALCIUM 8.6 8.9    PT/INR: No results for input(s): LABPROT, INR in the last 72 hours. ABG    Component Value Date/Time   O2SAT 74.9 07/31/2014 0442   CBG (last 3)  No results for input(s): GLUCAP in the last 72 hours.  Assessment/Plan:  He is stable for CABG tomorrow. I have personally reviewed his 2D echo and there is moderate MR due to ischemic changes in the LV. I think the current evidence suggests that mitral repair or replacement is not beneficial for moderate ischemic MR with no difference in mortality or functional recovery compared to CABG alone. His LVEF is about 25% and looks better than on his admission echo. Hopefully revascularization will result in further improvement. I discussed the operative  procedure with the patient and family including alternatives, benefits and risks; including but not limited to bleeding, blood transfusion, infection, stroke, myocardial infarction, graft failure, heart block requiring a permanent pacemaker, organ dysfunction, and death.  Carl [name] understands and agrees to proceed.    LOS: 6 days    BARTLE,BRYAN K 08/02/2014

## 2014-08-02 NOTE — Progress Notes (Signed)
Pre-surgical education done with the patient, his mom, and his brother. This education included what to expect before, during, and following a heart surgery. Family also watched the surgical video. Patient able to answer questions appropriately stating what kind of surgery he was having tomorrow and why.  All persons expressed understanding of the heart surgery to take place tomorrow and the risk and benefits associated with this procedure. They had no additional questions or concerns related to this procedure.   Consents signed by patient and also co-signed by his mother, Demarious Kapur. Consents placed in chart.   Roxan Hockey, RN

## 2014-08-02 NOTE — Progress Notes (Signed)
       Patient Name: Carl [name] Date of Encounter: 08/02/2014    SUBJECTIVE: He has no cardiopulmonary complaints. He specifically denies dyspnea and chest discomfort with ambulation. He is having lower back discomfort.  TELEMETRY:  Normal sinus rhythm. Filed Vitals:   08/02/14 0317 08/02/14 0826 08/02/14 1137 08/02/14 1151  BP: 89/71 91/72  81/63  Pulse: 89 80 71   Temp: 97.5 F (36.4 C) 97.9 F (36.6 C) 97.5 F (36.4 C)   TempSrc: Oral Oral Oral   Resp:  16 19 20   Height:      Weight: 145 lb 4.5 oz (65.9 kg)     SpO2: 95% 96% 96%     Intake/Output Summary (Last 24 hours) at 08/02/14 1215 Last data filed at 08/02/14 0900  Gross per 24 hour  Intake    720 ml  Output    950 ml  Net   -230 ml   LABS: Basic Metabolic Panel:  Recent Labs  07/31/14 0415 08/02/14 0232  NA 136* 134*  K 4.3 4.7  CL 95* 98  CO2 25 25  GLUCOSE 110* 106*  BUN 29* 27*  CREATININE 1.04 1.08  CALCIUM 8.6 8.9   CBC:  Recent Labs  07/31/14 0415 08/02/14 0232  WBC 5.0 4.1  HGB 15.1 15.2  HCT 43.6 45.0  MCV 95.2 94.1  PLT 168 178   ECHOCARDIOGRAM: 08/02/2014 Unable to find  Radiology/Studies:  No new data  Physical Exam: Blood pressure 81/63, pulse 71, temperature 97.5 F (36.4 C), temperature source Oral, resp. rate 20, height 6' (1.829 m), weight 145 lb 4.5 oz (65.9 kg), SpO2 96 %. Weight change: -3.5 oz (-0.1 kg)  Wt Readings from Last 3 Encounters:  08/02/14 145 lb 4.5 oz (65.9 kg)  11/21/13 170 lb (77.111 kg)    S4 gallop is heard at the apex. Chest is clear the basis No JVD is noted Extremities reveal no edema.  ASSESSMENT:  1. Acute on chronic systolic heart failure, stable 2. Severe three-vessel coronary disease, without recurrent angina 3. Learning disability/cognitive impairment  Plan:  1. CABG to be performed on Thursday by Dr. Cyndia Bent 2. Facilitate getting echocardiogram interpreted 3. No change in the current medical regimen  Signed, Sinclair Grooms 08/02/2014, 12:15 PM

## 2014-08-02 NOTE — Progress Notes (Signed)
CARDIAC REHAB PHASE I   PRE:  Rate/Rhythm: 66 SR    BP: sitting 79/55    SaO2:   MODE:  Ambulation: 350 ft   POST:  Rate/Rhythm: 95 SR    BP: sitting 96/65     SaO2:   Pt BP very low but asymptomatic. Able to walk without any problems except reported back pain. BP better after walking. Discussed OHS, sternal precautions, mobility with pt (no family present). Gave IS and pt able to perform correct mechanics the first attempt, 1200 mL. Gave pt OHS booklet and directions to watch video when his family arrived. Will inform RN. Pt seemed to understand basics. Sts his mother will be with him at d/c.  [phone]   Darrick Meigs CES, ACSM 08/02/2014 2:13 PM

## 2014-08-03 ENCOUNTER — Inpatient Hospital Stay (HOSPITAL_COMMUNITY): Payer: Medicare HMO

## 2014-08-03 ENCOUNTER — Inpatient Hospital Stay (HOSPITAL_COMMUNITY): Payer: Medicare HMO | Admitting: Anesthesiology

## 2014-08-03 ENCOUNTER — Encounter (HOSPITAL_COMMUNITY): Payer: Self-pay | Admitting: Anesthesiology

## 2014-08-03 ENCOUNTER — Encounter (HOSPITAL_COMMUNITY): Admission: AD | Disposition: A | Discharge status: Skilled Nursing Facility | Payer: Medicare HMO | Attending: Surgery

## 2014-08-03 DIAGNOSIS — Z951 Presence of aortocoronary bypass graft: Secondary | ICD-10-CM

## 2014-08-03 HISTORY — PX: CORONARY ARTERY BYPASS GRAFT: SHX141

## 2014-08-03 HISTORY — PX: TEE WITHOUT CARDIOVERSION: SHX5443

## 2014-08-03 LAB — CBC
HCT: 46.2 % (ref 39.0–52.0)
HCT: 28.7 % — ABNORMAL LOW (ref 39.0–52.0)
HEMATOCRIT: 34.1 % — AB (ref 39.0–52.0)
Hemoglobin: 15.7 g/dL (ref 13.0–17.0)
Hemoglobin: 11.8 g/dL — ABNORMAL LOW (ref 13.0–17.0)
Hemoglobin: 9.6 g/dL — ABNORMAL LOW (ref 13.0–17.0)
MCH: 32 pg (ref 26.0–34.0)
MCH: 32.8 pg (ref 26.0–34.0)
MCH: 31.5 pg (ref 26.0–34.0)
MCHC: 34 g/dL (ref 30.0–36.0)
MCHC: 34.6 g/dL (ref 30.0–36.0)
MCHC: 33.4 g/dL (ref 30.0–36.0)
MCV: 94.1 fL (ref 78.0–100.0)
MCV: 94.7 fL (ref 78.0–100.0)
MCV: 94.1 fL (ref 78.0–100.0)
PLATELETS: 194 10*3/uL (ref 150–400)
PLATELETS: 138 10*3/uL — AB (ref 150–400)
Platelets: 109 10*3/uL — ABNORMAL LOW (ref 150–400)
RBC: 4.91 MIL/uL (ref 4.22–5.81)
RBC: 3.6 MIL/uL — ABNORMAL LOW (ref 4.22–5.81)
RBC: 3.05 MIL/uL — AB (ref 4.22–5.81)
RDW: 13.2 % (ref 11.5–15.5)
RDW: 13.1 % (ref 11.5–15.5)
RDW: 13.1 % (ref 11.5–15.5)
WBC: 5.5 10*3/uL (ref 4.0–10.5)
WBC: 7 10*3/uL (ref 4.0–10.5)
WBC: 5.7 10*3/uL (ref 4.0–10.5)

## 2014-08-03 LAB — POCT I-STAT 3, ART BLOOD GAS (G3+)
Acid-Base Excess: 1 mmol/L (ref 0.0–2.0)
Acid-Base Excess: 1 mmol/L (ref 0.0–2.0)
Acid-base deficit: 2 mmol/L (ref 0.0–2.0)
Acid-base deficit: 3 mmol/L — ABNORMAL HIGH (ref 0.0–2.0)
Bicarbonate: 26.9 mEq/L — ABNORMAL HIGH (ref 20.0–24.0)
Bicarbonate: 25.7 mEq/L — ABNORMAL HIGH (ref 20.0–24.0)
Bicarbonate: 22.6 mEq/L (ref 20.0–24.0)
Bicarbonate: 23.1 mEq/L (ref 20.0–24.0)
Bicarbonate: 26 mEq/L — ABNORMAL HIGH (ref 20.0–24.0)
O2 Saturation: 100 %
O2 Saturation: 100 %
O2 Saturation: 93 %
O2 Saturation: 97 %
O2 Saturation: 95 %
Patient temperature: 36.6
Patient temperature: 37
Patient temperature: 37.2
TCO2: 28 mmol/L (ref 0–100)
TCO2: 27 mmol/L (ref 0–100)
TCO2: 24 mmol/L (ref 0–100)
TCO2: 25 mmol/L (ref 0–100)
TCO2: 27 mmol/L (ref 0–100)
pCO2 arterial: 45.9 mmHg — ABNORMAL HIGH (ref 35.0–45.0)
pCO2 arterial: 40.7 mmHg (ref 35.0–45.0)
pCO2 arterial: 38 mmHg (ref 35.0–45.0)
pCO2 arterial: 46.7 mmHg — ABNORMAL HIGH (ref 35.0–45.0)
pCO2 arterial: 49.4 mmHg — ABNORMAL HIGH (ref 35.0–45.0)
pH, Arterial: 7.377 (ref 7.350–7.450)
pH, Arterial: 7.408 (ref 7.350–7.450)
pH, Arterial: 7.38 (ref 7.350–7.450)
pH, Arterial: 7.303 — ABNORMAL LOW (ref 7.350–7.450)
pH, Arterial: 7.329 — ABNORMAL LOW (ref 7.350–7.450)
pO2, Arterial: 400 mmHg — ABNORMAL HIGH (ref 80.0–100.0)
pO2, Arterial: 325 mmHg — ABNORMAL HIGH (ref 80.0–100.0)
pO2, Arterial: 67 mmHg — ABNORMAL LOW (ref 80.0–100.0)
pO2, Arterial: 102 mmHg — ABNORMAL HIGH (ref 80.0–100.0)
pO2, Arterial: 82 mmHg (ref 80.0–100.0)

## 2014-08-03 LAB — POCT I-STAT, CHEM 8
BUN: 22 mg/dL (ref 6–23)
BUN: 18 mg/dL (ref 6–23)
BUN: 21 mg/dL (ref 6–23)
BUN: 18 mg/dL (ref 6–23)
BUN: 16 mg/dL (ref 6–23)
BUN: 16 mg/dL (ref 6–23)
Calcium, Ion: 1.21 mmol/L (ref 1.13–1.30)
Calcium, Ion: 1.04 mmol/L — ABNORMAL LOW (ref 1.13–1.30)
Calcium, Ion: 1.24 mmol/L (ref 1.13–1.30)
Calcium, Ion: 1.08 mmol/L — ABNORMAL LOW (ref 1.13–1.30)
Calcium, Ion: 1.02 mmol/L — ABNORMAL LOW (ref 1.13–1.30)
Calcium, Ion: 1.17 mmol/L (ref 1.13–1.30)
Chloride: 100 mEq/L (ref 96–112)
Chloride: 95 mEq/L — ABNORMAL LOW (ref 96–112)
Chloride: 99 mEq/L (ref 96–112)
Chloride: 98 mEq/L (ref 96–112)
Chloride: 101 mEq/L (ref 96–112)
Chloride: 99 mEq/L (ref 96–112)
Creatinine, Ser: 0.8 mg/dL (ref 0.50–1.35)
Creatinine, Ser: 0.7 mg/dL (ref 0.50–1.35)
Creatinine, Ser: 0.7 mg/dL (ref 0.50–1.35)
Creatinine, Ser: 0.7 mg/dL (ref 0.50–1.35)
Creatinine, Ser: 0.6 mg/dL (ref 0.50–1.35)
Creatinine, Ser: 0.8 mg/dL (ref 0.50–1.35)
Glucose, Bld: 118 mg/dL — ABNORMAL HIGH (ref 70–99)
Glucose, Bld: 116 mg/dL — ABNORMAL HIGH (ref 70–99)
Glucose, Bld: 137 mg/dL — ABNORMAL HIGH (ref 70–99)
Glucose, Bld: 123 mg/dL — ABNORMAL HIGH (ref 70–99)
Glucose, Bld: 136 mg/dL — ABNORMAL HIGH (ref 70–99)
Glucose, Bld: 139 mg/dL — ABNORMAL HIGH (ref 70–99)
HCT: 46 % (ref 39.0–52.0)
HCT: 31 % — ABNORMAL LOW (ref 39.0–52.0)
HCT: 41 % (ref 39.0–52.0)
HCT: 32 % — ABNORMAL LOW (ref 39.0–52.0)
HCT: 26 % — ABNORMAL LOW (ref 39.0–52.0)
HCT: 31 % — ABNORMAL LOW (ref 39.0–52.0)
Hemoglobin: 15.6 g/dL (ref 13.0–17.0)
Hemoglobin: 10.5 g/dL — ABNORMAL LOW (ref 13.0–17.0)
Hemoglobin: 13.9 g/dL (ref 13.0–17.0)
Hemoglobin: 10.9 g/dL — ABNORMAL LOW (ref 13.0–17.0)
Hemoglobin: 8.8 g/dL — ABNORMAL LOW (ref 13.0–17.0)
Hemoglobin: 10.5 g/dL — ABNORMAL LOW (ref 13.0–17.0)
Potassium: 4.7 mEq/L (ref 3.7–5.3)
Potassium: 4.7 mEq/L (ref 3.7–5.3)
Potassium: 4.8 mEq/L (ref 3.7–5.3)
Potassium: 4.6 mEq/L (ref 3.7–5.3)
Potassium: 4 mEq/L (ref 3.7–5.3)
Potassium: 4.7 mEq/L (ref 3.7–5.3)
Sodium: 136 mEq/L — ABNORMAL LOW (ref 137–147)
Sodium: 130 mEq/L — ABNORMAL LOW (ref 137–147)
Sodium: 135 mEq/L — ABNORMAL LOW (ref 137–147)
Sodium: 132 mEq/L — ABNORMAL LOW (ref 137–147)
Sodium: 132 mEq/L — ABNORMAL LOW (ref 137–147)
Sodium: 136 mEq/L — ABNORMAL LOW (ref 137–147)
TCO2: 24 mmol/L (ref 0–100)
TCO2: 24 mmol/L (ref 0–100)
TCO2: 24 mmol/L (ref 0–100)
TCO2: 24 mmol/L (ref 0–100)
TCO2: 24 mmol/L (ref 0–100)
TCO2: 23 mmol/L (ref 0–100)

## 2014-08-03 LAB — HEMOGLOBIN AND HEMATOCRIT, BLOOD
HEMATOCRIT: 32 % — AB (ref 39.0–52.0)
Hemoglobin: 11.2 g/dL — ABNORMAL LOW (ref 13.0–17.0)

## 2014-08-03 LAB — BLOOD GAS, ARTERIAL
Acid-base deficit: 0 mmol/L (ref 0.0–2.0)
Bicarbonate: 24.1 mEq/L — ABNORMAL HIGH (ref 20.0–24.0)
FIO2: 0.21 %
O2 SAT: 96.2 %
PATIENT TEMPERATURE: 98.6
TCO2: 25.3 mmol/L (ref 0–100)
pCO2 arterial: 39.5 mmHg (ref 35.0–45.0)
pH, Arterial: 7.403 (ref 7.350–7.450)
pO2, Arterial: 83.4 mmHg (ref 80.0–100.0)

## 2014-08-03 LAB — POCT I-STAT 4, (NA,K, GLUC, HGB,HCT)
Glucose, Bld: 118 mg/dL — ABNORMAL HIGH (ref 70–99)
HCT: 34 % — ABNORMAL LOW (ref 39.0–52.0)
Hemoglobin: 11.6 g/dL — ABNORMAL LOW (ref 13.0–17.0)
Potassium: 3.8 mEq/L (ref 3.7–5.3)
Sodium: 136 mEq/L — ABNORMAL LOW (ref 137–147)

## 2014-08-03 LAB — MAGNESIUM: Magnesium: 2.9 mg/dL — ABNORMAL HIGH (ref 1.5–2.5)

## 2014-08-03 LAB — BASIC METABOLIC PANEL
ANION GAP: 13 (ref 5–15)
BUN: 21 mg/dL (ref 6–23)
CHLORIDE: 96 meq/L (ref 96–112)
CO2: 25 meq/L (ref 19–32)
Calcium: 9.3 mg/dL (ref 8.4–10.5)
Creatinine, Ser: 0.98 mg/dL (ref 0.50–1.35)
GFR calc Af Amer: >90 mL/min (ref >90)
GFR calc non Af Amer: 86 mL/min — ABNORMAL LOW (ref >90)
Glucose, Bld: 96 mg/dL (ref 70–99)
POTASSIUM: 4.8 meq/L (ref 3.7–5.3)
SODIUM: 134 meq/L — AB (ref 137–147)

## 2014-08-03 LAB — GLUCOSE, CAPILLARY
Glucose-Capillary: 96 mg/dL (ref 70–99)
Glucose-Capillary: 101 mg/dL — ABNORMAL HIGH (ref 70–99)
Glucose-Capillary: 105 mg/dL — ABNORMAL HIGH (ref 70–99)
Glucose-Capillary: 101 mg/dL — ABNORMAL HIGH (ref 70–99)
Glucose-Capillary: 134 mg/dL — ABNORMAL HIGH (ref 70–99)

## 2014-08-03 LAB — PROTIME-INR
INR: 1.27 (ref 0.00–1.49)
Prothrombin Time: 16.1 seconds — ABNORMAL HIGH (ref 11.6–15.2)

## 2014-08-03 LAB — APTT: aPTT: 38 seconds — ABNORMAL HIGH (ref 24–37)

## 2014-08-03 LAB — CREATININE, SERUM
Creatinine, Ser: 0.7 mg/dL (ref 0.50–1.35)
GFR calc non Af Amer: >90 mL/min (ref >90)

## 2014-08-03 LAB — FIBRINOGEN: Fibrinogen: 322 mg/dL (ref 204–475)

## 2014-08-03 LAB — PLATELET COUNT: PLATELETS: 111 10*3/uL — AB (ref 150–400)

## 2014-08-03 SURGERY — CORONARY ARTERY BYPASS GRAFTING (CABG)
Anesthesia: General | Site: Chest

## 2014-08-03 MED ORDER — HEPARIN SODIUM (PORCINE) 1000 UNIT/ML IJ SOLN
INTRAMUSCULAR | Status: AC
  Filled 2014-08-03: qty 1

## 2014-08-03 MED ORDER — MILRINONE IN DEXTROSE 20 MG/100ML IV SOLN
0.3000 ug/kg/min | INTRAVENOUS | Status: DC
  Filled 2014-08-03: qty 100

## 2014-08-03 MED ORDER — VECURONIUM BROMIDE 10 MG IV SOLR
INTRAVENOUS | Status: DC | PRN
  Administered 2014-08-03: 2 mg via INTRAVENOUS
  Administered 2014-08-03 (×2): 4 mg via INTRAVENOUS

## 2014-08-03 MED ORDER — SODIUM CHLORIDE 0.9 % IV SOLN
250.0000 mL | INTRAVENOUS | Status: DC
  Administered 2014-08-03: 250 mL via INTRAVENOUS

## 2014-08-03 MED ORDER — MIDAZOLAM HCL 5 MG/5ML IJ SOLN
INTRAMUSCULAR | Status: DC | PRN
  Administered 2014-08-03 (×2): 2 mg via INTRAVENOUS
  Administered 2014-08-03: 1 mg via INTRAVENOUS
  Administered 2014-08-03: 2 mg via INTRAVENOUS
  Administered 2014-08-03: 1 mg via INTRAVENOUS
  Administered 2014-08-03: 2 mg via INTRAVENOUS

## 2014-08-03 MED ORDER — DOPAMINE-DEXTROSE 1.6-5 MG/ML-% IV SOLN
INTRAVENOUS | Status: DC | PRN
  Administered 2014-08-03: 5 ug/kg/min via INTRAVENOUS

## 2014-08-03 MED ORDER — MILRINONE IN DEXTROSE 20 MG/100ML IV SOLN
0.1250 ug/kg/min | INTRAVENOUS | Status: DC
  Filled 2014-08-03: qty 100

## 2014-08-03 MED ORDER — BISACODYL 10 MG RE SUPP: 10.0000 mg | Freq: Every day | RECTAL | Status: DC

## 2014-08-03 MED ORDER — ASPIRIN EC 325 MG PO TBEC
325.0000 mg | DELAYED_RELEASE_TABLET | Freq: Every day | ORAL | Status: DC
  Administered 2014-08-04 – 2014-08-11 (×8): 325 mg via ORAL
  Filled 2014-08-03 (×8): qty 1

## 2014-08-03 MED ORDER — ROCURONIUM BROMIDE 50 MG/5ML IV SOLN
INTRAVENOUS | Status: AC
Start: 2014-08-03 — End: 2014-08-03
  Filled 2014-08-03: qty 2

## 2014-08-03 MED ORDER — ACETAMINOPHEN 500 MG PO TABS
1000.0000 mg | ORAL_TABLET | Freq: Four times a day (QID) | ORAL | Status: AC
Start: 2014-08-04 — End: 2014-08-08
  Administered 2014-08-04 – 2014-08-08 (×18): 1000 mg via ORAL
  Filled 2014-08-03 (×20): qty 2

## 2014-08-03 MED ORDER — DOCUSATE SODIUM 100 MG PO CAPS
200.0000 mg | ORAL_CAPSULE | Freq: Every day | ORAL | Status: DC
  Administered 2014-08-04 – 2014-08-10 (×7): 200 mg via ORAL
  Filled 2014-08-03 (×7): qty 2

## 2014-08-03 MED ORDER — SODIUM CHLORIDE 0.9 % IJ SOLN
3.0000 mL | Freq: Two times a day (BID) | INTRAMUSCULAR | Status: DC
  Administered 2014-08-04 – 2014-08-07 (×7): 3 mL via INTRAVENOUS
  Administered 2014-08-08: 10 mL via INTRAVENOUS
  Administered 2014-08-08 – 2014-08-10 (×4): 3 mL via INTRAVENOUS

## 2014-08-03 MED ORDER — OXYCODONE HCL 5 MG PO TABS: 5.0000 mg | ORAL_TABLET | ORAL | Status: DC | PRN

## 2014-08-03 MED ORDER — FENTANYL CITRATE 0.05 MG/ML IJ SOLN
INTRAMUSCULAR | Status: DC | PRN
  Administered 2014-08-03: 100 ug via INTRAVENOUS
  Administered 2014-08-03: 200 ug via INTRAVENOUS
  Administered 2014-08-03: 250 ug via INTRAVENOUS
  Administered 2014-08-03: 50 ug via INTRAVENOUS

## 2014-08-03 MED ORDER — LACTATED RINGERS IV SOLN
INTRAVENOUS | Status: DC | PRN
  Administered 2014-08-03: 07:00:00 via INTRAVENOUS

## 2014-08-03 MED ORDER — FAMOTIDINE IN NACL 20-0.9 MG/50ML-% IV SOLN
20.0000 mg | Freq: Two times a day (BID) | INTRAVENOUS | Status: DC
  Administered 2014-08-03: 20 mg via INTRAVENOUS

## 2014-08-03 MED ORDER — FENTANYL CITRATE 0.05 MG/ML IJ SOLN
INTRAMUSCULAR | Status: AC
  Filled 2014-08-03: qty 5

## 2014-08-03 MED ORDER — TRAMADOL HCL 50 MG PO TABS
50.0000 mg | ORAL_TABLET | ORAL | Status: DC | PRN
  Administered 2014-08-05 – 2014-08-07 (×6): 100 mg via ORAL
  Administered 2014-08-09 – 2014-08-11 (×2): 50 mg via ORAL
  Filled 2014-08-03: qty 2
  Filled 2014-08-03 (×2): qty 1
  Filled 2014-08-03 (×5): qty 2

## 2014-08-03 MED ORDER — ALBUMIN HUMAN 5 % IV SOLN
250.0000 mL | INTRAVENOUS | Status: AC | PRN
  Administered 2014-08-03 – 2014-08-04 (×3): 250 mL via INTRAVENOUS
  Filled 2014-08-03 (×2): qty 250

## 2014-08-03 MED ORDER — MAGNESIUM SULFATE 4 GM/100ML IV SOLN
4.0000 g | Freq: Once | INTRAVENOUS | Status: AC
  Administered 2014-08-03: 4 g via INTRAVENOUS
  Filled 2014-08-03: qty 100

## 2014-08-03 MED ORDER — SODIUM CHLORIDE 0.45 % IV SOLN
INTRAVENOUS | Status: DC
  Administered 2014-08-03: 14:00:00 via INTRAVENOUS

## 2014-08-03 MED ORDER — ACETAMINOPHEN 160 MG/5ML PO SOLN
1000.0000 mg | Freq: Four times a day (QID) | ORAL | Status: AC
Start: 2014-08-04 — End: 2014-08-08

## 2014-08-03 MED ORDER — ALBUMIN HUMAN 5 % IV SOLN
INTRAVENOUS | Status: DC | PRN
  Administered 2014-08-03: 13:00:00 via INTRAVENOUS

## 2014-08-03 MED ORDER — PHENYLEPHRINE HCL 10 MG/ML IJ SOLN
0.0000 ug/min | INTRAVENOUS | Status: DC
  Administered 2014-08-03: 90 ug/min via INTRAVENOUS
  Filled 2014-08-03 (×2): qty 2

## 2014-08-03 MED ORDER — THROMBIN 20000 UNITS EX SOLR
CUTANEOUS | Status: DC | PRN
  Administered 2014-08-03: 20000 [IU] via TOPICAL

## 2014-08-03 MED ORDER — MILRINONE IN DEXTROSE 20 MG/100ML IV SOLN
INTRAVENOUS | Status: DC | PRN
  Administered 2014-08-03: 0.5 ug/kg/min via INTRAVENOUS

## 2014-08-03 MED ORDER — METOPROLOL TARTRATE 1 MG/ML IV SOLN: 2.5000 mg | INTRAVENOUS | Status: DC | PRN

## 2014-08-03 MED ORDER — SODIUM CHLORIDE 0.9 % IV SOLN
INTRAVENOUS | Status: DC
  Filled 2014-08-03: qty 2.5

## 2014-08-03 MED ORDER — LACTATED RINGERS IV SOLN: 500.0000 mL | Freq: Once | INTRAVENOUS | Status: AC | PRN

## 2014-08-03 MED ORDER — STERILE WATER FOR INJECTION IJ SOLN
INTRAMUSCULAR | Status: AC
  Filled 2014-08-03: qty 10

## 2014-08-03 MED ORDER — LACTATED RINGERS IV SOLN
INTRAVENOUS | Status: DC
  Administered 2014-08-11: 10:00:00 via INTRAVENOUS

## 2014-08-03 MED ORDER — MIDAZOLAM HCL 10 MG/2ML IJ SOLN
INTRAMUSCULAR | Status: AC
  Filled 2014-08-03: qty 2

## 2014-08-03 MED ORDER — ACETAMINOPHEN 160 MG/5ML PO SOLN: 650.0000 mg | Freq: Once | ORAL | Status: AC

## 2014-08-03 MED ORDER — HEMOSTATIC AGENTS (NO CHARGE) OPTIME
TOPICAL | Status: DC | PRN
  Administered 2014-08-03: 1 via TOPICAL

## 2014-08-03 MED ORDER — POTASSIUM CHLORIDE 10 MEQ/50ML IV SOLN
10.0000 meq | INTRAVENOUS | Status: AC
  Administered 2014-08-03 (×3): 10 meq via INTRAVENOUS

## 2014-08-03 MED ORDER — HEPARIN SODIUM (PORCINE) 1000 UNIT/ML IJ SOLN
INTRAMUSCULAR | Status: DC | PRN
  Administered 2014-08-03: 24000 [IU] via INTRAVENOUS

## 2014-08-03 MED ORDER — ACETAMINOPHEN 650 MG RE SUPP
650.0000 mg | Freq: Once | RECTAL | Status: AC
  Administered 2014-08-03: 650 mg via RECTAL

## 2014-08-03 MED ORDER — SUCCINYLCHOLINE CHLORIDE 20 MG/ML IJ SOLN
INTRAMUSCULAR | Status: AC
  Filled 2014-08-03: qty 1

## 2014-08-03 MED ORDER — BISACODYL 5 MG PO TBEC
10.0000 mg | DELAYED_RELEASE_TABLET | Freq: Every day | ORAL | Status: DC
  Administered 2014-08-04 – 2014-08-10 (×7): 10 mg via ORAL
  Filled 2014-08-03 (×7): qty 2

## 2014-08-03 MED ORDER — SODIUM CHLORIDE 0.9 % IJ SOLN: 3.0000 mL | INTRAMUSCULAR | Status: DC | PRN

## 2014-08-03 MED ORDER — ASPIRIN 81 MG PO CHEW
324.0000 mg | CHEWABLE_TABLET | Freq: Every day | ORAL | Status: DC
  Filled 2014-08-03: qty 4

## 2014-08-03 MED ORDER — SODIUM CHLORIDE 0.9 % IV SOLN
INTRAVENOUS | Status: DC
  Administered 2014-08-03: 10 mL/h via INTRAVENOUS

## 2014-08-03 MED ORDER — NITROGLYCERIN IN D5W 200-5 MCG/ML-% IV SOLN: 0.0000 ug/min | INTRAVENOUS | Status: DC

## 2014-08-03 MED ORDER — ONDANSETRON HCL 4 MG/2ML IJ SOLN
4.0000 mg | Freq: Four times a day (QID) | INTRAMUSCULAR | Status: DC | PRN
  Administered 2014-08-04 – 2014-08-07 (×2): 4 mg via INTRAVENOUS
  Filled 2014-08-03 (×2): qty 2

## 2014-08-03 MED ORDER — MORPHINE SULFATE 2 MG/ML IJ SOLN
2.0000 mg | INTRAMUSCULAR | Status: DC | PRN
  Administered 2014-08-04 (×2): 2 mg via INTRAVENOUS
  Filled 2014-08-03 (×2): qty 1

## 2014-08-03 MED ORDER — ETOMIDATE 2 MG/ML IV SOLN
INTRAVENOUS | Status: AC
  Filled 2014-08-03: qty 10

## 2014-08-03 MED ORDER — INSULIN REGULAR BOLUS VIA INFUSION
0.0000 [IU] | Freq: Three times a day (TID) | INTRAVENOUS | Status: DC
  Filled 2014-08-03: qty 10

## 2014-08-03 MED ORDER — MORPHINE SULFATE 2 MG/ML IJ SOLN
1.0000 mg | INTRAMUSCULAR | Status: AC | PRN
  Administered 2014-08-03: 2 mg via INTRAVENOUS
  Administered 2014-08-03: 1 mg via INTRAVENOUS
  Filled 2014-08-03 (×2): qty 1

## 2014-08-03 MED ORDER — METOPROLOL TARTRATE 25 MG/10 ML ORAL SUSPENSION
12.5000 mg | Freq: Two times a day (BID) | ORAL | Status: DC
  Filled 2014-08-03 (×3): qty 5

## 2014-08-03 MED ORDER — MIDAZOLAM HCL 2 MG/2ML IJ SOLN
2.0000 mg | INTRAMUSCULAR | Status: DC | PRN
  Administered 2014-08-03: 2 mg via INTRAVENOUS
  Filled 2014-08-03: qty 2

## 2014-08-03 MED ORDER — INSULIN ASPART 100 UNIT/ML ~~LOC~~ SOLN
0.0000 [IU] | SUBCUTANEOUS | Status: DC
  Administered 2014-08-03 – 2014-08-04 (×3): 2 [IU] via SUBCUTANEOUS

## 2014-08-03 MED ORDER — METOPROLOL TARTRATE 12.5 MG HALF TABLET
12.5000 mg | ORAL_TABLET | Freq: Two times a day (BID) | ORAL | Status: DC
  Filled 2014-08-03 (×3): qty 1

## 2014-08-03 MED ORDER — PANTOPRAZOLE SODIUM 40 MG PO TBEC
40.0000 mg | DELAYED_RELEASE_TABLET | Freq: Every day | ORAL | Status: DC
  Administered 2014-08-05 – 2014-08-11 (×7): 40 mg via ORAL
  Filled 2014-08-03 (×7): qty 1

## 2014-08-03 MED ORDER — DOPAMINE-DEXTROSE 3.2-5 MG/ML-% IV SOLN
5.0000 ug/kg/min | INTRAVENOUS | Status: DC
  Administered 2014-08-05 – 2014-08-07 (×2): 5 ug/kg/min via INTRAVENOUS
  Filled 2014-08-03 (×4): qty 250

## 2014-08-03 MED ORDER — PROTAMINE SULFATE 10 MG/ML IV SOLN
INTRAVENOUS | Status: DC | PRN
  Administered 2014-08-03: 200 mg via INTRAVENOUS

## 2014-08-03 MED ORDER — ROCURONIUM BROMIDE 100 MG/10ML IV SOLN
INTRAVENOUS | Status: DC | PRN
  Administered 2014-08-03: 60 mg via INTRAVENOUS
  Administered 2014-08-03: 40 mg via INTRAVENOUS

## 2014-08-03 MED ORDER — DEXMEDETOMIDINE HCL IN NACL 200 MCG/50ML IV SOLN: 0.0000 ug/kg/h | INTRAVENOUS | Status: DC

## 2014-08-03 MED ORDER — SODIUM CHLORIDE 0.9 % IV SOLN: Freq: Once | INTRAVENOUS | Status: DC

## 2014-08-03 MED ORDER — THROMBIN 20000 UNITS EX SOLR
CUTANEOUS | Status: AC
  Filled 2014-08-03: qty 20000

## 2014-08-03 MED ORDER — PROPOFOL 10 MG/ML IV BOLUS
INTRAVENOUS | Status: AC
  Filled 2014-08-03: qty 20

## 2014-08-03 MED ORDER — SODIUM CHLORIDE 0.9 % IV SOLN
INTRAVENOUS | Status: DC | PRN
  Administered 2014-08-03: 13:00:00 via INTRAVENOUS

## 2014-08-03 MED ORDER — PHENYLEPHRINE HCL 10 MG/ML IJ SOLN
0.0000 ug/min | INTRAVENOUS | Status: DC
  Administered 2014-08-04: 40 ug/min via INTRAVENOUS
  Administered 2014-08-04: 90 ug/min via INTRAVENOUS
  Administered 2014-08-04: 75 ug/min via INTRAVENOUS
  Administered 2014-08-05: 30 ug/min via INTRAVENOUS
  Administered 2014-08-08: 8 ug/min via INTRAVENOUS
  Filled 2014-08-03 (×7): qty 4

## 2014-08-03 MED ORDER — VANCOMYCIN HCL IN DEXTROSE 1-5 GM/200ML-% IV SOLN
1000.0000 mg | Freq: Once | INTRAVENOUS | Status: AC
  Administered 2014-08-03: 1000 mg via INTRAVENOUS
  Filled 2014-08-03: qty 200

## 2014-08-03 MED ORDER — DEXTROSE 5 % IV SOLN
1.5000 g | Freq: Two times a day (BID) | INTRAVENOUS | Status: AC
  Administered 2014-08-03 – 2014-08-05 (×4): 1.5 g via INTRAVENOUS
  Filled 2014-08-03 (×4): qty 1.5

## 2014-08-03 MED ORDER — PROTAMINE SULFATE 10 MG/ML IV SOLN
INTRAVENOUS | Status: AC
  Filled 2014-08-03: qty 25

## 2014-08-03 MED ORDER — ETOMIDATE 2 MG/ML IV SOLN
INTRAVENOUS | Status: DC | PRN
  Administered 2014-08-03: 16 mg via INTRAVENOUS

## 2014-08-03 MED ORDER — ATORVASTATIN CALCIUM 80 MG PO TABS
80.0000 mg | ORAL_TABLET | Freq: Every day | ORAL | Status: DC
Start: 2014-08-04 — End: 2014-08-10
  Administered 2014-08-04 – 2014-08-09 (×6): 80 mg via ORAL
  Filled 2014-08-03 (×7): qty 1

## 2014-08-03 MED ORDER — THROMBIN 20000 UNITS EX SOLR
OROMUCOSAL | Status: DC | PRN
  Administered 2014-08-03: 4 mL via TOPICAL

## 2014-08-03 MED ORDER — VECURONIUM BROMIDE 10 MG IV SOLR
INTRAVENOUS | Status: AC
  Filled 2014-08-03: qty 10

## 2014-08-03 MED ORDER — SODIUM CHLORIDE 0.9 % IJ SOLN
INTRAMUSCULAR | Status: AC
  Filled 2014-08-03: qty 20

## 2014-08-03 MED ORDER — ATROPINE SULFATE 0.1 MG/ML IJ SOLN
INTRAMUSCULAR | Status: AC
  Filled 2014-08-03: qty 10

## 2014-08-03 MED ORDER — EPHEDRINE SULFATE 50 MG/ML IJ SOLN
INTRAMUSCULAR | Status: AC
  Filled 2014-08-03: qty 1

## 2014-08-03 MED FILL — Lidocaine HCl IV Inj 20 MG/ML: INTRAVENOUS | Qty: 5 | Status: AC

## 2014-08-03 MED FILL — Sodium Chloride IV Soln 0.9%: INTRAVENOUS | Qty: 2000 | Status: AC

## 2014-08-03 MED FILL — Heparin Sodium (Porcine) Inj 1000 Unit/ML: INTRAMUSCULAR | Qty: 10 | Status: AC

## 2014-08-03 MED FILL — Mannitol IV Soln 20%: INTRAVENOUS | Qty: 500 | Status: AC

## 2014-08-03 MED FILL — Sodium Bicarbonate IV Soln 8.4%: INTRAVENOUS | Qty: 50 | Status: AC

## 2014-08-03 MED FILL — Electrolyte-R (PH 7.4) Solution: INTRAVENOUS | Qty: 4000 | Status: AC

## 2014-08-03 SURGICAL SUPPLY — 106 items
ADAPTER CARDIO PERF ANTE/RETRO (ADAPTER) ×3 IMPLANT
ATTRACTOMAT 16X20 MAGNETIC DRP (DRAPES) ×3 IMPLANT
BAG DECANTER FOR FLEXI CONT (MISCELLANEOUS) ×3 IMPLANT
BANDAGE ELASTIC 4 VELCRO ST LF (GAUZE/BANDAGES/DRESSINGS) ×3 IMPLANT
BANDAGE ELASTIC 6 VELCRO ST LF (GAUZE/BANDAGES/DRESSINGS) ×3 IMPLANT
BASKET HEART (ORDER IN 25'S) (MISCELLANEOUS) ×1
BASKET HEART (ORDER IN 25S) (MISCELLANEOUS) ×2 IMPLANT
BLADE STERNUM SYSTEM 6 (BLADE) ×3 IMPLANT
BNDG GAUZE ELAST 4 BULKY (GAUZE/BANDAGES/DRESSINGS) ×3 IMPLANT
CANISTER SUCTION 2500CC (MISCELLANEOUS) ×3 IMPLANT
CANNULA GUNDRY RCSP 15FR (MISCELLANEOUS) ×3 IMPLANT
CARDIAC SUCTION (MISCELLANEOUS) ×3 IMPLANT
CATH ROBINSON RED A/P 18FR (CATHETERS) ×9 IMPLANT
CATH THORACIC 28FR (CATHETERS) ×3 IMPLANT
CATH THORACIC 36FR (CATHETERS) ×3 IMPLANT
CATH THORACIC 36FR RT ANG (CATHETERS) ×3 IMPLANT
CLIP RETRACTION 3.0MM CORONARY (MISCELLANEOUS) ×3 IMPLANT
CLIP TI MEDIUM 24 (CLIP) IMPLANT
CLIP TI WIDE RED SMALL 24 (CLIP) ×3 IMPLANT
COVER SURGICAL LIGHT HANDLE (MISCELLANEOUS) ×3 IMPLANT
CRADLE DONUT ADULT HEAD (MISCELLANEOUS) ×3 IMPLANT
DRAPE CARDIOVASCULAR INCISE (DRAPES) ×1
DRAPE SLUSH/WARMER DISC (DRAPES) ×3 IMPLANT
DRAPE SRG 135X102X78XABS (DRAPES) ×2 IMPLANT
DRSG COVADERM 4X14 (GAUZE/BANDAGES/DRESSINGS) ×3 IMPLANT
ELECT CAUTERY BLADE 6.4 (BLADE) ×3 IMPLANT
ELECT REM PT RETURN 9FT ADLT (ELECTROSURGICAL) ×6
ELECTRODE REM PT RTRN 9FT ADLT (ELECTROSURGICAL) ×4 IMPLANT
GAUZE SPONGE 4X4 12PLY STRL (GAUZE/BANDAGES/DRESSINGS) ×6 IMPLANT
GLOVE BIO SURGEON STRL SZ 6 (GLOVE) IMPLANT
GLOVE BIO SURGEON STRL SZ 6.5 (GLOVE) ×9 IMPLANT
GLOVE BIO SURGEON STRL SZ7.5 (GLOVE) ×6 IMPLANT
GLOVE BIOGEL PI IND STRL 6 (GLOVE) ×4 IMPLANT
GLOVE BIOGEL PI IND STRL 6.5 (GLOVE) ×4 IMPLANT
GLOVE BIOGEL PI IND STRL 7.0 (GLOVE) ×4 IMPLANT
GLOVE BIOGEL PI INDICATOR 6 (GLOVE) ×2
GLOVE BIOGEL PI INDICATOR 6.5 (GLOVE) ×2
GLOVE BIOGEL PI INDICATOR 7.0 (GLOVE) ×2
GLOVE EUDERMIC 7 POWDERFREE (GLOVE) ×6 IMPLANT
GLOVE ORTHO TXT STRL SZ7.5 (GLOVE) IMPLANT
GOWN STRL REUS W/ TWL LRG LVL3 (GOWN DISPOSABLE) ×8 IMPLANT
GOWN STRL REUS W/ TWL XL LVL3 (GOWN DISPOSABLE) ×2 IMPLANT
GOWN STRL REUS W/TWL LRG LVL3 (GOWN DISPOSABLE) ×4
GOWN STRL REUS W/TWL XL LVL3 (GOWN DISPOSABLE) ×1
HEMOSTAT POWDER SURGIFOAM 1G (HEMOSTASIS) ×9 IMPLANT
HEMOSTAT SURGICEL 2X14 (HEMOSTASIS) ×6 IMPLANT
INSERT FOGARTY 61MM (MISCELLANEOUS) IMPLANT
INSERT FOGARTY XLG (MISCELLANEOUS) IMPLANT
KIT BASIN OR (CUSTOM PROCEDURE TRAY) ×3 IMPLANT
KIT CATH CPB BARTLE (MISCELLANEOUS) ×3 IMPLANT
KIT ROOM TURNOVER OR (KITS) ×3 IMPLANT
KIT SUCTION CATH 14FR (SUCTIONS) ×3 IMPLANT
KIT VASOVIEW W/TROCAR VH 2000 (KITS) ×3 IMPLANT
NS IRRIG 1000ML POUR BTL (IV SOLUTION) ×15 IMPLANT
PACK OPEN HEART (CUSTOM PROCEDURE TRAY) ×3 IMPLANT
PAD ARMBOARD 7.5X6 YLW CONV (MISCELLANEOUS) ×6 IMPLANT
PAD ELECT DEFIB RADIOL ZOLL (MISCELLANEOUS) ×3 IMPLANT
PENCIL BUTTON HOLSTER BLD 10FT (ELECTRODE) ×3 IMPLANT
PUNCH AORTIC ROTATE 4.0MM (MISCELLANEOUS) IMPLANT
PUNCH AORTIC ROTATE 4.5MM 8IN (MISCELLANEOUS) ×3 IMPLANT
PUNCH AORTIC ROTATE 5MM 8IN (MISCELLANEOUS) IMPLANT
SET CARDIOPLEGIA MPS 5001102 (MISCELLANEOUS) ×3 IMPLANT
SPONGE GAUZE 4X4 12PLY STER LF (GAUZE/BANDAGES/DRESSINGS) ×6 IMPLANT
SPONGE INTESTINAL PEANUT (DISPOSABLE) IMPLANT
SPONGE LAP 18X18 X RAY DECT (DISPOSABLE) ×3 IMPLANT
SPONGE LAP 4X18 X RAY DECT (DISPOSABLE) ×3 IMPLANT
STOPCOCK 4 WAY LG BORE MALE ST (IV SETS) ×3 IMPLANT
SUT BONE WAX W31G (SUTURE) ×3 IMPLANT
SUT MNCRL AB 4-0 PS2 18 (SUTURE) IMPLANT
SUT PROLENE 3 0 SH DA (SUTURE) IMPLANT
SUT PROLENE 3 0 SH1 36 (SUTURE) ×3 IMPLANT
SUT PROLENE 4 0 RB 1 (SUTURE) ×1
SUT PROLENE 4 0 SH DA (SUTURE) IMPLANT
SUT PROLENE 4-0 RB1 .5 CRCL 36 (SUTURE) ×2 IMPLANT
SUT PROLENE 5 0 C 1 36 (SUTURE) IMPLANT
SUT PROLENE 6 0 C 1 30 (SUTURE) ×6 IMPLANT
SUT PROLENE 7 0 BV 1 (SUTURE) ×3 IMPLANT
SUT PROLENE 7 0 BV1 MDA (SUTURE) ×6 IMPLANT
SUT PROLENE 8 0 BV175 6 (SUTURE) ×3 IMPLANT
SUT SILK  1 MH (SUTURE)
SUT SILK 1 MH (SUTURE) IMPLANT
SUT SILK 2 0 SH CR/8 (SUTURE) ×3 IMPLANT
SUT STEEL 6MS V (SUTURE) ×6 IMPLANT
SUT STEEL STERNAL CCS#1 18IN (SUTURE) IMPLANT
SUT STEEL SZ 6 DBL 3X14 BALL (SUTURE) IMPLANT
SUT VIC AB 1 CTX 36 (SUTURE) ×2
SUT VIC AB 1 CTX36XBRD ANBCTR (SUTURE) ×4 IMPLANT
SUT VIC AB 2-0 CT1 27 (SUTURE) ×1
SUT VIC AB 2-0 CT1 TAPERPNT 27 (SUTURE) ×2 IMPLANT
SUT VIC AB 2-0 CTX 27 (SUTURE) IMPLANT
SUT VIC AB 3-0 SH 27 (SUTURE)
SUT VIC AB 3-0 SH 27X BRD (SUTURE) IMPLANT
SUT VIC AB 3-0 X1 27 (SUTURE) ×3 IMPLANT
SUT VICRYL 4-0 PS2 18IN ABS (SUTURE) IMPLANT
SUTURE E-PAK OPEN HEART (SUTURE) ×3 IMPLANT
SYSTEM SAHARA CHEST DRAIN ATS (WOUND CARE) ×3 IMPLANT
TAPE CLOTH SURG 4X10 WHT LF (GAUZE/BANDAGES/DRESSINGS) ×3 IMPLANT
TOWEL OR 17X24 6PK STRL BLUE (TOWEL DISPOSABLE) ×3 IMPLANT
TOWEL OR 17X26 10 PK STRL BLUE (TOWEL DISPOSABLE) ×3 IMPLANT
TRAY CATH LUMEN 1 20CM STRL (SET/KITS/TRAYS/PACK) ×3 IMPLANT
TRAY FOLEY IC TEMP SENS 16FR (CATHETERS) ×3 IMPLANT
TUBE SUCT INTRACARD DLP 20F (MISCELLANEOUS) ×3 IMPLANT
TUBING ART PRESS 48 MALE/FEM (TUBING) ×6 IMPLANT
TUBING INSUFFLATION (TUBING) ×3 IMPLANT
UNDERPAD 30X30 INCONTINENT (UNDERPADS AND DIAPERS) ×3 IMPLANT
WATER STERILE IRR 1000ML POUR (IV SOLUTION) ×6 IMPLANT

## 2014-08-03 NOTE — Anesthesia Postprocedure Evaluation (Signed)
  Anesthesia Post-op Note  Patient: Carl [name]  Procedure(s) Performed: Procedure(s): CORONARY ARTERY BYPASS GRAFTING (CABG) (N/A) TRANSESOPHAGEAL ECHOCARDIOGRAM (TEE) (N/A)  Patient Location: SICU  Anesthesia Type:General  Level of Consciousness: sedated, unresponsive and Patient remains intubated per anesthesia plan  Airway and Oxygen Therapy: Patient remains intubated per anesthesia plan and Patient placed on Ventilator (see vital sign flow sheet for setting)  Post-op Pain: none  Post-op Assessment: Post-op Vital signs reviewed and Patient's Cardiovascular Status Stable  Post-op Vital Signs: stable  Last Vitals:  Filed Vitals:   08/03/14 1620  BP:   Pulse: 98  Temp: 36.8 C  Resp: 14    Complications: No apparent anesthesia complications

## 2014-08-03 NOTE — Brief Op Note (Signed)
      [address]       [place] [address]             [phone]     07/27/2014 - 08/03/2014  11:42 AM  PATIENT:  Carl [name]  63 y.o. male  PRE-OPERATIVE DIAGNOSIS:  CAD  POST-OPERATIVE DIAGNOSIS:  CAD  PROCEDURE:  Procedure(s): CORONARY ARTERY BYPASS GRAFTING (CABG)5 LIMA-LAD; SEQ SVG-OM 2-OM3; SVG-DIAG; SVG-PD TRANSESOPHAGEAL ECHOCARDIOGRAM (TEE) EVH: LEFT LEG  SURGEON:  Surgeon(s): [name], MD  PHYSICIAN ASSISTANT: Leevi Cullars PA-C  ANESTHESIA:   general  PATIENT CONDITION:  ICU - intubated and hemodynamically stable.  PRE-OPERATIVE WEIGHT: 64kg  EBL: SEE ANEST/PERFUSION RECORDS  COMPLICATIONS: NO KNOWN

## 2014-08-03 NOTE — OR Nursing (Signed)
45 min call made to SICU

## 2014-08-03 NOTE — Plan of Care (Signed)
Problem: Phase II - Intermediate Post-Op Goal: Wean to Extubate Outcome: Completed/Met Date Met:  08/03/14

## 2014-08-03 NOTE — Op Note (Signed)
CARDIOVASCULAR SURGERY OPERATIVE NOTE  08/03/2014  Surgeon:  [name], MD  First Assistant: Jadene Pierini,  PA-C   Preoperative Diagnosis:  Severe multi-vessel coronary artery disease   Postoperative Diagnosis:  Same   Procedure:  1. Median Sternotomy 2. Extracorporeal circulation 3.   Coronary artery bypass grafting x 5   Left internal mammary graft to the LAD  SVG to diagonal  Sequential SVG to OM2 and OM3  SVG to PDA 4.   Endoscopic vein harvest from the left leg 5.   Insertion of left femoral arterial line   Anesthesia:  General Endotracheal   Clinical History/Surgical Indication:  The patient is a 63 year old gentleman with a history of mild mental retardation who lives with his mother and has a strong family history of premature coronary artery disease. He presented with a few week history of not feeling well with back pain radiating to both arms, associated with dry cough and shortness of breath. He had recently been treated for pneumonia before Thanksgiving by his primary physician without improvement in symptoms. When he presented to the ER his troponin was 24 and ECG showed anteroseptal ST elevation with lateral T wave changes and large anterior Q-waves. He underwent cath at [facility] that showed severe 3 vessel CAD with severe LV dysfunction with an EF by echo of 15%, elevated LVEDP. There was an ostial 95% LAD stenosis, a 90% proximal diagonal stenosis, a 90% ostial LCX stenosis. The OM1 was small but the OM2 was large. After the OM2 there was complete occlusion with filling of the OM3 by left to left collaterals. The RCA was occluded proximally with filling of the distal vessel by left to right collaterals. He also reportedly had moderate ischemic MR on echo.The patient was transferred to [facility] and was hypotensive. He was started on dopamine and milrinone which have  subsequently been weaned off and he was treated with Levophed which was weaned off. He maintained hemodynamic stability. A repeat echo on 12/7 showed an EF of 25% with moderate MR. It was felt that CABG was the best treatment to preserve the remaining viable myocardium. I did not feel that the moderate MR warranted MV repair or replacement in this patient. I discussed the operative procedure with the patient and family including alternatives, benefits and risks; including but not limited to bleeding, blood transfusion, infection, stroke, myocardial infarction, graft failure, heart block requiring a permanent pacemaker, organ dysfunction, and death.  Carl [name] understands and agrees to proceed.    Preparation:  The patient was seen in the preoperative holding area and the correct patient, correct operation were confirmed with the patient after reviewing the medical record and catheterization. The consent was signed by me. Preoperative antibiotics were given. A pulmonary arterial line and radial arterial line were placed by the anesthesia team. The patient was taken back to the operating room and positioned supine on the operating room table. After being placed under general endotracheal anesthesia by the anesthesia team a foley catheter was placed. The neck, chest, abdomen, and both legs were prepped with betadine soap and solution and draped in the usual sterile manner. A surgical time-out was taken and the correct patient and operative procedure were confirmed with the nursing and anesthesia staff.  TEE:  Performed by Dr. Rica Koyanagi  This showed severe LV dysfunction with an EF of about 25% and mild to moderate central MR that was felt to be ischemic with tethering of the MV leaflets from LV dysfunction  and dilatation. He had moderate pulmonary HTN by PA tracing.    Insertion of left femoral arterial line:  The left femoral artery was cannulated with a needle and a guidwire inserted without  difficulty. Along femoral arterial catheter was inserted over the wire, which was then removed. The catheter had a good arterial tracing. It was fixed to the skin with sutures.   Cardiopulmonary Bypass:  A median sternotomy was performed. The pericardium was opened in the midline. Right ventricular function appeared normal. The ascending aorta was of normal size and had no palpable plaque. There were no contraindications to aortic cannulation or cross-clamping. The patient was fully systemically heparinized and the ACT was maintained > 400 sec. The proximal aortic arch was cannulated with a 20 F aortic cannula for arterial inflow. Venous cannulation was performed via the right atrial appendage using a two-staged venous cannula. An antegrade cardioplegia/vent cannula was inserted into the mid-ascending aorta. Aortic occlusion was performed with a single cross-clamp. Systemic cooling to 32 degrees Centigrade and topical cooling of the heart with iced saline were used. Hyperkalemic antegrade cold blood cardioplegia was used to induce diastolic arrest and was then given at about 20 minute intervals throughout the period of arrest to maintain myocardial temperature at or below 10 degrees centigrade. A temperature probe was inserted into the interventricular septum and an insulating pad was placed in the pericardium.   Left internal mammary harvest:  The left side of the sternum was retracted using the Rultract retractor. The left internal mammary artery was harvested as a pedicle graft. All side branches were clipped. It was a medium-sized vessel of good quality with excellent blood flow. It was ligated distally and divided. It was sprayed with topical papaverine solution to prevent vasospasm.   Endoscopic vein harvest:  The left greater saphenous vein was harvested endoscopically through a 2 cm incision medial to the left knee. It was harvested from the upper thigh to below the knee. It was a medium-sized  vein of good quality. The side branches were all ligated with 4-0 silk ties. We initially tried to expose the right GSV below the knee but could not find a suitable vein and therefore moved to the left side.   Coronary arteries:  The coronary arteries were examined.   LAD:  Intramyocardial in its proximal and mid portion and visible on the surface distally where there was no disease. The LIMA would not reach distally with his large heart and therefore the LAD was exposed in the muscle in the mid portion where it was large and had no disease. The diagonal was a large vessel with no distal disease. The anterior wall was thinned.  LCX:  OM1 was small, OM2 was large with no distal disease. The OM3 was diffusely diseased but the distal portion of the vessel was graftable.  RCA:  The PDA was a moderate sized vessel with no distal disease. The PL was small. There was patchy scar on the inferior wall.   Grafts:  1. LIMA to the LAD: 2.5 mm. It was sewn end to side using 8-0 prolene continuous suture. 2. SVG to Diagonal:  1.75 mm. It was sewn end to side using 7-0 prolene continuous suture. 3. Sequential SVG to OM2:  1.75 mm. It was sewn sequential side to side using 7-0 prolene continuous suture. 4. Sequential SVG to OM3:  1.6 mm. It was sewn sequential end to side using 7-0 prolene continuous suture. 5. SVG to PDA: 1.6 mm.  It was  sewn sequential end to side using 7-0 prolene continuous suture.   The proximal vein graft anastomoses were performed to the mid-ascending aorta using continuous 6-0 prolene suture. Graft markers were placed around the proximal anastomoses.   Completion:  The patient was rewarmed to 37 degrees Centigrade. The clamp was removed from the LIMA pedicle and there was rapid warming of the septum and return of ventricular fibrillation. The crossclamp was removed with a time of 102 minutes. There was spontaneous return of sinus rhythm. The distal and proximal anastomoses were  checked for hemostasis. The position of the grafts was satisfactory. Two temporary epicardial pacing wires were placed on the right atrium and two on the right ventricle. The patient was weaned from CPB without difficulty on Milrinone 0.3 and dopamine 5. CPB time was 130 minutes. Cardiac output was 5 LPM. TEE showed no change in LV or RV function and no change in the mild to moderate MR. Heparin was fully reversed with protamine and the aortic and venous cannulas removed. Hemostasis was achieved. Mediastinal and left pleural drainage tubes were placed. The sternum was closed with  #6 stainless steel wires. The fascia was closed with continuous # 1 vicryl suture. The subcutaneous tissue was closed with 2-0 vicryl continuous suture. The skin was closed with 3-0 vicryl subcuticular suture. All sponge, needle, and instrument counts were reported correct at the end of the case. Dry sterile dressings were placed over the incisions and around the chest tubes which were connected to pleurevac suction. The patient was then transported to the surgical intensive care unit in critical but stable condition.

## 2014-08-03 NOTE — Transfer of Care (Signed)
Immediate Anesthesia Transfer of Care Note  Patient: Carl [name]  Procedure(s) Performed: Procedure(s): CORONARY ARTERY BYPASS GRAFTING (CABG) (N/A) TRANSESOPHAGEAL ECHOCARDIOGRAM (TEE) (N/A)  Patient Location: SICU  Anesthesia Type:General  Level of Consciousness: sedated, unresponsive and Patient remains intubated per anesthesia plan  Airway & Oxygen Therapy: Patient placed on Ventilator (see vital sign flow sheet for setting)  Post-op Assessment: Report given to PACU RN and Post -op Vital signs reviewed and stable  Post vital signs: Reviewed and stable  Complications: No apparent anesthesia complications

## 2014-08-03 NOTE — Op Note (Signed)
NAMEADAIR, LAUDERBACK                 ACCOUNT NO.:  [id number]  MEDICAL RECORD NO.:  [id number]  LOCATION:  2S15C                        FACILITY:  [facility]  PHYSICIAN:  Ala Dach, M.D.DATE OF BIRTH:  08-Oct-1950  DATE OF PROCEDURE:  08/03/2014 DATE OF DISCHARGE:                              OPERATIVE REPORT   INDICATIONS FOR PROCEDURE:  Ms. Rafi Kenneth is a 63 year old gentleman who presents today for coronary artery bypass grafting.  His history is that presenting essentially in cardiogenic shock, found to have very poor LV function and in need of urgent coronary artery bypass grafting. On the morning of surgery, he was brought to the holding area where under local anesthesia with sedation, pulmonary artery and radial arterial lines were placed.  After routine induction of general anesthesia, the TEE probe was placed for TEE examination.  PRE-CARDIOPULMONARY BYPASS TEE EXAMINATION:  Left Ventricle:  The left ventricular chamber is seen initially in the short axis view.  It is a dilated, thin-walled left ventricular chamber that shows global systolic dysfunction.  There is global systolic diminishment of function or severe global hypokinesis appreciated.  The inferior, inferoseptal, septal, and anteroseptal areas are nearly akinetic.  The portion of the apex is nearly akinetic as well.    The LV chamber diameter is measured at 6.5 to nearly 7 cm in diameter indicative of an enlarged left ventricular chamber. Est EF is 20%  Mitral Valve:  The mitral valve is seen in the four-chamber view.  It is thin, compliant, mobile.  There is some degenerative appearance to this valve.  There is some mild prolapse in the area of the anterior leaflet. Overall, the annular area is dilated.  On color Doppler, there is 2+ mitral regurgitant flow appreciated centrally and along the lateral posterior aspect of the left atrium.  Left Atrium:  Mildly enlarged left atrial chamber is appreciated.   The septum is intact, it is interrogated. No PFOs and shunts are appreciated.  Aortic Valve:  Aortic valve was seen initially in the short axis view. It is trileaflet, thin, compliant, mobile.  On color Doppler, there is no aortic insufficiency or aortic stenosis appreciated.  No other masses are appreciated there.  Right Ventricle:  Mildly enlarged right ventricular chamber is appreciated.  Overall right ventricular chamber is contractile.  Tricuspid Valve:  The area of tricuspid annulus is visualized, thin, compliant, mobile.  On color Doppler, there is 2+ tricuspid regurgitant flow appreciated.  Right Atrium:  The right atrial chamber is noted to be just very mildly enlarged.  No masses are appreciated within other than pulmonary artery catheter.  The patient was placed on cardiopulmonary bypass.  Coronary artery bypass grafting was carried out.  Inotropes were begun and the patient was separated from cardiopulmonary bypass with the initial attempt.  POST CARDIOPULMONARY BYPASS TEE EXAMINATION (LIMITED EXAM):  Left Ventricle:  Left ventricular chamber is again viewed in the short axis view.  Overall, the area of akinesis noted in the inferior and inferoseptal areas remain the same.  Overall, there is significantly diminished global contractility appreciated, however, this is no worst than in the prebypass period and overall may be actually slightly improved.  Both long and short axis views are obtained.  Mitral Valve:  The area of the mitral valve was then focused on, especially area of the coaptation points of the cusp.  Color Doppler is used to demonstrate that the regurgitant jet remains essentially 2+ or just borderline moderate regurgitant flow.  Right Ventricle:  Right ventricular chamber is dynamic and contractile.  The rest of the cardiac examination was as previously described without any significant changes and the patient was returned to the cardiac intensive  care unit in stable condition.          ______________________________ Ala Dach, M.D.     JTM/MEDQ  D:  08/03/2014  T:  08/03/2014  Job:  [id number]

## 2014-08-03 NOTE — Anesthesia Preprocedure Evaluation (Addendum)
Anesthesia Evaluation  Patient identified by MRN, date of birth, ID band Patient awake    Reviewed: Allergy & Precautions, H&P , NPO status   Airway Mallampati: I       Dental  (+) Edentulous Upper, Edentulous Lower   Pulmonary Current Smoker,  breath sounds clear to auscultation        Cardiovascular + angina + CAD and + Past MI Rhythm:Regular Rate:Normal     Neuro/Psych    GI/Hepatic PUD,   Endo/Other    Renal/GU Renal InsufficiencyRenal disease     Musculoskeletal   Abdominal   Peds  Hematology   Anesthesia Other Findings   Reproductive/Obstetrics                            Anesthesia Physical Anesthesia Plan  ASA: III  Anesthesia Plan: General   Post-op Pain Management:    Induction: Intravenous  Airway Management Planned: Oral ETT  Additional Equipment: Arterial line, PA Cath and TEE  Intra-op Plan:   Post-operative Plan: Extubation in OR  Informed Consent: I have reviewed the patients History and Physical, chart, labs and discussed the procedure including the risks, benefits and alternatives for the proposed anesthesia with the patient or authorized representative who has indicated his/her understanding and acceptance.   Dental advisory given  Plan Discussed with: CRNA and Surgeon  Anesthesia Plan Comments:         Anesthesia Quick Evaluation

## 2014-08-03 NOTE — Progress Notes (Signed)
S/p CABG x 5  Still intubated, but starting to wake up  BP 77/51 mmHg  Pulse 96  Temp(Src) 98.6 F (37 C) (Oral)  Resp 14  Ht 6' (1.829 m)  Wt 141 lb 5 oz (64.1 kg)  BMI 19.16 kg/m2  SpO2 100%   Intake/Output Summary (Last 24 hours) at 08/03/14 1722 Last data filed at 08/03/14 1716  Gross per 24 hour  Intake 5162.16 ml  Output   5045 ml  Net 117.16 ml   Hct 34, K= 3.8  Doing well early postop

## 2014-08-03 NOTE — Procedures (Signed)
Extubation Procedure Note  Patient Details:   Name: Carl [name] DOB: December 17, 1950 MRN: [id number]   Airway Documentation:  Airway 8 mm (Active)  Secured at (cm) 22 cm 08/03/2014  3:33 PM  Measured From Lips 08/03/2014  3:33 PM  Secured Location Right 08/03/2014  3:33 PM  Secured By Rana Snare Tape 08/03/2014  3:33 PM  Cuff Pressure (cm H2O) 24 cm H2O 08/03/2014  3:33 PM  Site Condition Dry 08/03/2014  3:33 PM    Evaluation  O2 sats: stable throughout Complications: No apparent complications Patient did tolerate procedure well. Bilateral Breath Sounds: Rhonchi Suctioning: Airway Yes   Patient extubated to 4L nasal cannula.  Positive cuff leak noted.  NIF of -24, VC of 1L.  Patient able to speak post extubation.  Incentive spirometry performed with assistance of RN.  No evidence of stridor. Sats currently 100%.  Vitals are stable.   Philomena Doheny 08/03/2014, 6:25 PM

## 2014-08-03 NOTE — Anesthesia Procedure Notes (Signed)
Anesthesia Procedure Note PA catheter:  Routine monitors. Timeout, sterile prep, drape, FBP R neck.  Trendelenburg position.  1% Lido local, finder and trocar RIJ 1st pass with US guidance.  Cordis placed over J wire. PA catheter in easily.  Sterile dressing applied.  Patient tolerated well, VSS.  C , MD  06:30-06:42   

## 2014-08-03 NOTE — Progress Notes (Signed)
  Echocardiogram 2D Echocardiogram has been performed.  Donata Clay 08/03/2014, 8:14 AM

## 2014-08-04 ENCOUNTER — Inpatient Hospital Stay (HOSPITAL_COMMUNITY): Payer: Medicare HMO

## 2014-08-04 ENCOUNTER — Encounter (HOSPITAL_COMMUNITY): Payer: Self-pay | Admitting: Surgery

## 2014-08-04 DIAGNOSIS — I200 Unstable angina: Secondary | ICD-10-CM

## 2014-08-04 DIAGNOSIS — Z951 Presence of aortocoronary bypass graft: Secondary | ICD-10-CM

## 2014-08-04 LAB — CREATININE, SERUM
Creatinine, Ser: 0.78 mg/dL (ref 0.50–1.35)
GFR calc Af Amer: >90 mL/min (ref >90)
GFR calc non Af Amer: >90 mL/min (ref >90)

## 2014-08-04 LAB — BASIC METABOLIC PANEL
Anion gap: 10 (ref 5–15)
BUN: 15 mg/dL (ref 6–23)
CALCIUM: 8.7 mg/dL (ref 8.4–10.5)
CHLORIDE: 99 meq/L (ref 96–112)
CO2: 25 mEq/L (ref 19–32)
CREATININE: 0.73 mg/dL (ref 0.50–1.35)
GFR calc non Af Amer: >90 mL/min (ref >90)
Glucose, Bld: 128 mg/dL — ABNORMAL HIGH (ref 70–99)
Potassium: 4.9 mEq/L (ref 3.7–5.3)
Sodium: 134 mEq/L — ABNORMAL LOW (ref 137–147)

## 2014-08-04 LAB — POCT I-STAT, CHEM 8
BUN: 14 mg/dL (ref 6–23)
Calcium, Ion: 1.23 mmol/L (ref 1.13–1.30)
Chloride: 96 mEq/L (ref 96–112)
Creatinine, Ser: 0.8 mg/dL (ref 0.50–1.35)
Glucose, Bld: 155 mg/dL — ABNORMAL HIGH (ref 70–99)
HCT: 29 % — ABNORMAL LOW (ref 39.0–52.0)
Hemoglobin: 9.9 g/dL — ABNORMAL LOW (ref 13.0–17.0)
Potassium: 4.8 mEq/L (ref 3.7–5.3)
Sodium: 131 mEq/L — ABNORMAL LOW (ref 137–147)
TCO2: 23 mmol/L (ref 0–100)

## 2014-08-04 LAB — CBC
HCT: 29.2 % — ABNORMAL LOW (ref 39.0–52.0)
HCT: 28.5 % — ABNORMAL LOW (ref 39.0–52.0)
HEMOGLOBIN: 9.5 g/dL — AB (ref 13.0–17.0)
Hemoglobin: 9.7 g/dL — ABNORMAL LOW (ref 13.0–17.0)
MCH: 31.2 pg (ref 26.0–34.0)
MCH: 31.3 pg (ref 26.0–34.0)
MCHC: 33.2 g/dL (ref 30.0–36.0)
MCHC: 33.3 g/dL (ref 30.0–36.0)
MCV: 93.9 fL (ref 78.0–100.0)
MCV: 93.8 fL (ref 78.0–100.0)
Platelets: 146 10*3/uL — ABNORMAL LOW (ref 150–400)
Platelets: 140 10*3/uL — ABNORMAL LOW (ref 150–400)
RBC: 3.11 MIL/uL — ABNORMAL LOW (ref 4.22–5.81)
RBC: 3.04 MIL/uL — ABNORMAL LOW (ref 4.22–5.81)
RDW: 13.2 % (ref 11.5–15.5)
RDW: 13.1 % (ref 11.5–15.5)
WBC: 6.1 10*3/uL (ref 4.0–10.5)
WBC: 6.2 10*3/uL (ref 4.0–10.5)

## 2014-08-04 LAB — GLUCOSE, CAPILLARY
Glucose-Capillary: 114 mg/dL — ABNORMAL HIGH (ref 70–99)
Glucose-Capillary: 131 mg/dL — ABNORMAL HIGH (ref 70–99)
Glucose-Capillary: 137 mg/dL — ABNORMAL HIGH (ref 70–99)
Glucose-Capillary: 152 mg/dL — ABNORMAL HIGH (ref 70–99)

## 2014-08-04 LAB — PREPARE PLATELET PHERESIS: UNIT DIVISION: 0

## 2014-08-04 LAB — MAGNESIUM
Magnesium: 2.2 mg/dL (ref 1.5–2.5)
Magnesium: 1.9 mg/dL (ref 1.5–2.5)

## 2014-08-04 MED ORDER — ENOXAPARIN SODIUM 40 MG/0.4ML ~~LOC~~ SOLN
40.0000 mg | Freq: Every day | SUBCUTANEOUS | Status: DC
  Administered 2014-08-04 – 2014-08-16 (×13): 40 mg via SUBCUTANEOUS
  Filled 2014-08-04 (×14): qty 0.4

## 2014-08-04 MED ORDER — INSULIN ASPART 100 UNIT/ML ~~LOC~~ SOLN
0.0000 [IU] | SUBCUTANEOUS | Status: DC
  Administered 2014-08-04 – 2014-08-05 (×5): 2 [IU] via SUBCUTANEOUS

## 2014-08-04 MED ORDER — CALCIUM CHLORIDE 10 % IV SOLN
1.0000 g | Freq: Once | INTRAVENOUS | Status: AC
  Administered 2014-08-04: 1 g via INTRAVENOUS

## 2014-08-04 MED ORDER — BOOST / RESOURCE BREEZE PO LIQD
1.0000 | Freq: Three times a day (TID) | ORAL | Status: DC
  Administered 2014-08-04 – 2014-08-09 (×10): 1 via ORAL

## 2014-08-04 MED FILL — Magnesium Sulfate Inj 50%: INTRAMUSCULAR | Qty: 10 | Status: AC

## 2014-08-04 MED FILL — Heparin Sodium (Porcine) Inj 1000 Unit/ML: INTRAMUSCULAR | Qty: 30 | Status: AC

## 2014-08-04 MED FILL — Potassium Chloride Inj 2 mEq/ML: INTRAVENOUS | Qty: 40 | Status: AC

## 2014-08-04 NOTE — Progress Notes (Signed)
NUTRITION FOLLOW UP  DOCUMENTATION CODES Per approved criteria  -Severe malnutrition in the context of chronic illness   INTERVENTION:  Re-order Resource Breeze po TID, each supplement provides 250 kcal and 9 grams of protein RD to follow for nutrition care plan  NUTRITION DIAGNOSIS: Malnutrition related to chronic illness as evidenced by severe fat and muscle depletion, ongoing  Goal: Pt to meet >/= 90% of their estimated nutrition needs, progressing  Monitor:  PO & supplemental intake, weight, labs, I/O's  ASSESSMENT: Pt seen at [facility] due to SOB. Cath on 12/3 showed 5 vessel disease (>90%), tx to [facility] for CABG eval.  Pt admitted with anteroseptal ST elevation myocardial infarction with late presentation. Pt lives at home with his mom, hx of mild mental retardation.   Patient s/p procedures 12/10: CORONARY ARTERY BYPASS GRAFTING (CABG) x 5 TRANSESOPHAGEAL ECHOCARDIOGRAM (TEE) EVH: LEFT LEG  Pt currently on Clear Liquids.  Per RN, taking well.  Prior to surgery was receiving Resource Breeze oral nutrition supplement.  Would benefit with continuing given malnutrition.  RD to re-order.  Height: Ht Readings from Last 1 Encounters:  08/03/14 6' (1.829 m)    Weight: Wt Readings from Last 1 Encounters:  08/04/14 162 lb 11.2 oz (73.8 kg)    BMI:  Body mass index is 22.06 kg/(m^2).  Estimated Nutritional Needs: Kcal: 2000-2200 Protein: 100-115 grams Fluid: > 2 L/day  Skin: skin abrasion  Diet Order: Diet clear liquid   Intake/Output Summary (Last 24 hours) at 08/04/14 1209 Last data filed at 08/04/14 1000  Gross per 24 hour  Intake 5799.95 ml  Output   4555 ml  Net 1244.95 ml    Labs:   Recent Labs Lab 08/02/14 0232 08/03/14 0410  08/03/14 1221 08/03/14 1344 08/03/14 1909 08/03/14 1930 08/04/14 0413  NA 134* 134*  < > 132* 136* 136*  --  134*  K 4.7 4.8  < > 4.0 3.8 4.7  --  4.9  CL 98 96  < > 101  --  99  --  99  CO2 25 25  --   --   --   --   --  25   BUN 27* 21  < > 16  --  16  --  15  CREATININE 1.08 0.98  < > 0.60  --  0.80 0.70 0.73  CALCIUM 8.9 9.3  --   --   --   --   --  8.7  MG  --   --   --   --   --   --  2.9* 2.2  GLUCOSE 106* 96  < > 136* 118* 139*  --  128*  < > = values in this interval not displayed.  CBG (last 3)   Recent Labs  08/03/14 1957 08/03/14 2342 08/04/14 0330  GLUCAP 134* 137* 131*    Scheduled Meds: . sodium chloride   Intravenous Once  . acetaminophen  1,000 mg Oral 4 times per day   Or  . acetaminophen (TYLENOL) oral liquid 160 mg/5 mL  1,000 mg Per Tube 4 times per day  . aspirin EC  325 mg Oral Daily   Or  . aspirin  324 mg Per Tube Daily  . atorvastatin  80 mg Oral q1800  . bisacodyl  10 mg Oral Daily   Or  . bisacodyl  10 mg Rectal Daily  . cefUROXime (ZINACEF)  IV  1.5 g Intravenous Q12H  . docusate sodium  200 mg Oral Daily  .  enoxaparin (LOVENOX) injection  40 mg Subcutaneous QHS  . insulin aspart  0-24 Units Subcutaneous 6 times per day  . insulin regular  0-10 Units Intravenous TID WC  . [START ON 08/05/2014] pantoprazole  40 mg Oral Daily  . sodium chloride  3 mL Intravenous Q12H    Continuous Infusions: . sodium chloride Stopped (08/04/14 0800)  . sodium chloride 250 mL (08/03/14 1345)  . sodium chloride Stopped (08/04/14 0800)  . dexmedetomidine Stopped (08/03/14 1800)  . DOPamine 5 mcg/kg/min (08/04/14 0700)  . insulin (NOVOLIN-R) infusion Stopped (08/03/14 1800)  . lactated ringers 20 mL/hr at 08/04/14 0800  . milrinone 0.2 mcg/kg/min (08/04/14 0800)  . nitroGLYCERIN 0 mcg/min (08/03/14 1350)  . phenylephrine (NEO-SYNEPHRINE) Adult infusion 75 mcg/min (08/04/14 1103)    Past Medical History  Diagnosis Date  . Psoriasis   . Hearing loss   . Paronychia   . Vitiligo   . PUD (peptic ulcer disease)   . Leg fracture, left   . Paronychia   . Tobacco abuse     Past Surgical History  Procedure Laterality Date  . Hernia repair      Arthur Holms, RD,  LDN Pager #: [phone] After-Hours Pager #: [phone]

## 2014-08-04 NOTE — Progress Notes (Signed)
1 Day Post-Op Procedure(s) (LRB): CORONARY ARTERY BYPASS GRAFTING (CABG) (N/A) TRANSESOPHAGEAL ECHOCARDIOGRAM (TEE) (N/A) Subjective:  Complain of pain around chest tubes  Objective: Vital signs in last 24 hours: Temp:  [97.3 F (36.3 C)-99.5 F (37.5 C)] 99 F (37.2 C) (12/11 0600) Pulse Rate:  [83-114] 110 (12/11 0600) Cardiac Rhythm:  [-] Sinus tachycardia (12/11 0000) Resp:  [12-33] 33 (12/11 0600) BP: (72-94)/(46-54) 88/53 mmHg (12/10 2200) SpO2:  [97 %-100 %] 100 % (12/11 0600) Arterial Line BP: (83-108)/(41-51) 107/46 mmHg (12/11 0600) FiO2 (%):  [40 %-50 %] 40 % (12/10 1700) Weight:  [73.8 kg (162 lb 11.2 oz)] 73.8 kg (162 lb 11.2 oz) (12/11 0500)  Hemodynamic parameters for last 24 hours: PAP: (23-34)/(9-18) 26/10 mmHg CO:  [4.4 L/min-5.8 L/min] 5.4 L/min CI:  [2.4 L/min/m2-3.2 L/min/m2] 2.9 L/min/m2  On dopamine 5 and milrinone 0.3, neo at 80 mcg  Intake/Output from previous day: 12/10 0701 - 12/11 0700 In: 6985.6 [I.V.:5037.9; Blood:797.7; IV Piggyback:1150] Out: 4696 [Urine:3580; Blood:950; Chest Tube:1140] Intake/Output this shift:    General appearance: alert and cooperative Neurologic: intact Heart: regular rate and rhythm and soft systolic murmur at apex Lungs: diminished breath sounds RLL Abdomen: soft, non-tender; bowel sounds normal; no masses,  no organomegaly Extremities: edema mild Wound: dressing dry  Lab Results:  Recent Labs  08/03/14 1930 08/04/14 0413  WBC 5.7 6.1  HGB 9.6* 9.7*  HCT 28.7* 29.2*  PLT 138* 146*   BMET:  Recent Labs  08/03/14 0410  08/03/14 1909 08/03/14 1930 08/04/14 0413  NA 134*  < > 136*  --  134*  K 4.8  < > 4.7  --  4.9  CL 96  < > 99  --  99  CO2 25  --   --   --  25  GLUCOSE 96  < > 139*  --  128*  BUN 21  < > 16  --  15  CREATININE 0.98  < > 0.80 0.70 0.73  CALCIUM 9.3  --   --   --  8.7  < > = values in this interval not displayed.  PT/INR:  Recent Labs  08/03/14 1343  LABPROT 16.1*  INR  1.27   ABG    Component Value Date/Time   PHART 7.329* 08/03/2014 1905   HCO3 26.0* 08/03/2014 1905   TCO2 23 08/03/2014 1909   ACIDBASEDEF 3.0* 08/03/2014 1754   O2SAT 95.0 08/03/2014 1905   CBG (last 3)   Recent Labs  08/03/14 1957 08/03/14 2342 08/04/14 0330  GLUCAP 134* 137* 131*   CXR: right lung atelectasis  ECG: sinus, no acute changes  Assessment/Plan: S/P Procedure(s) (LRB): CORONARY ARTERY BYPASS GRAFTING (CABG) (N/A) TRANSESOPHAGEAL ECHOCARDIOGRAM (TEE) (N/A)   Ischemic cardiomyopathy with EF 25% s/p anteroseptal STEMI on admission.  He is hemodynamically stable on current drip but vasodilated, probably from milrinone. He had high PA pressures preop but they are on the low side now. Wean Milrinone this am, then neo. Continue dopamine. His BP preop was staying in the 90's.  Hold on diuresis until stable off pressors.  Keep chest tubes in today but get him OOB Work on IS  Lovenox and SCD's for DVT prophylaxis.         LOS: 8 days    BARTLE,BRYAN K 08/04/2014

## 2014-08-04 NOTE — Progress Notes (Signed)
Patient examined and record reviewed.Hemodynamics stable,labs satisfactory.Patient had stable day.Continue current care. Follow up CXR in am for R pleural effusion- may nee chest tube VAN TRIGT III,PETER 08/04/2014

## 2014-08-04 NOTE — Progress Notes (Signed)
Subjective:  POD #1 CABG X5. 3VD with severe LVD and mod MR s/p recent anterior MI. Looks good. C/O chest wall pain primarily  Objective:  Temp:  [97.3 F (36.3 C)-99.5 F (37.5 C)] 99 F (37.2 C) (12/11 0700) Pulse Rate:  [83-114] 110 (12/11 0700) Resp:  [12-33] 26 (12/11 0700) BP: (72-94)/(46-54) 88/53 mmHg (12/10 2200) SpO2:  [97 %-100 %] 99 % (12/11 0700) Arterial Line BP: (83-108)/(41-51) 105/49 mmHg (12/11 0700) FiO2 (%):  [40 %-50 %] 40 % (12/10 1700) Weight:  [162 lb 11.2 oz (73.8 kg)] 162 lb 11.2 oz (73.8 kg) (12/11 0500) Weight change: 21 lb 6.2 oz (9.7 kg)  Intake/Output from previous day: 12/10 0701 - 12/11 0700 In: 6985.6 [I.V.:5037.9; Blood:797.7; IV Piggyback:1150] Out: 5038 [Urine:3580; Blood:950; Chest Tube:1140]  Intake/Output from this shift:    Physical Exam: General appearance: alert and mild distress Neck: no adenopathy, no carotid bruit, no JVD, supple, symmetrical, trachea midline and thyroid not enlarged, symmetric, no tenderness/mass/nodules Lungs: clear to auscultation bilaterally Heart: regular rate and rhythm, S1, S2 normal, no murmur, click, rub or gallop Extremities: extremities normal, atraumatic, no cyanosis or edema  Lab Results: Results for orders placed or performed during the hospital encounter of 07/27/14 (from the past 48 hour(s))  Type and screen     Status: None   Collection Time: 08/02/14  4:06 PM  Result Value Ref Range   ABO/RH(D) O POS    Antibody Screen NEG    Sample Expiration 08/05/2014   ABO/Rh     Status: None   Collection Time: 08/02/14  4:06 PM  Result Value Ref Range   ABO/RH(D) O POS   Surgical pcr screen     Status: None   Collection Time: 08/02/14  7:02 PM  Result Value Ref Range   MRSA, PCR NEGATIVE NEGATIVE   Staphylococcus aureus NEGATIVE NEGATIVE    Comment:        The Xpert SA Assay (FDA approved for NASAL specimens in patients over 31 years of age), is one component of a comprehensive  surveillance program.  Test performance has been validated by [employer] for patients greater than or equal to 7 year old. It is not intended to diagnose infection nor to guide or monitor treatment.   Blood gas, arterial     Status: Abnormal   Collection Time: 08/03/14  3:40 AM  Result Value Ref Range   FIO2 0.21 %   pH, Arterial 7.403 7.350 - 7.450   pCO2 arterial 39.5 35.0 - 45.0 mmHg   pO2, Arterial 83.4 80.0 - 100.0 mmHg   Bicarbonate 24.1 (H) 20.0 - 24.0 mEq/L   TCO2 25.3 0 - 100 mmol/L   Acid-base deficit 0.0 0.0 - 2.0 mmol/L   O2 Saturation 96.2 %   Patient temperature 98.6   CBC     Status: None   Collection Time: 08/03/14  4:10 AM  Result Value Ref Range   WBC 5.5 4.0 - 10.5 K/uL   RBC 4.91 4.22 - 5.81 MIL/uL   Hemoglobin 15.7 13.0 - 17.0 g/dL   HCT 46.2 39.0 - 52.0 %   MCV 94.1 78.0 - 100.0 fL   MCH 32.0 26.0 - 34.0 pg   MCHC 34.0 30.0 - 36.0 g/dL   RDW 13.2 11.5 - 15.5 %   Platelets 194 150 - 400 K/uL  Basic metabolic panel     Status: Abnormal   Collection Time: 08/03/14  4:10 AM  Result Value Ref Range  Sodium 134 (L) 137 - 147 mEq/L   Potassium 4.8 3.7 - 5.3 mEq/L   Chloride 96 96 - 112 mEq/L   CO2 25 19 - 32 mEq/L   Glucose, Bld 96 70 - 99 mg/dL   BUN 21 6 - 23 mg/dL   Creatinine, Ser 0.98 0.50 - 1.35 mg/dL   Calcium 9.3 8.4 - 10.5 mg/dL   GFR calc non Af Amer 86 (L) >90 mL/min   GFR calc Af Amer >90 >90 mL/min    Comment: (NOTE) The eGFR has been calculated using the CKD EPI equation. This calculation has not been validated in all clinical situations. eGFR's persistently <90 mL/min signify possible Chronic Kidney Disease.    Anion gap 13 5 - 15  I-STAT, chem 8     Status: Abnormal   Collection Time: 08/03/14  8:05 AM  Result Value Ref Range   Sodium 136 (L) 137 - 147 mEq/L   Potassium 4.7 3.7 - 5.3 mEq/L   Chloride 100 96 - 112 mEq/L   BUN 22 6 - 23 mg/dL   Creatinine, Ser 0.80 0.50 - 1.35 mg/dL   Glucose, Bld 118 (H) 70 - 99 mg/dL    Calcium, Ion 1.21 1.13 - 1.30 mmol/L   TCO2 24 0 - 100 mmol/L   Hemoglobin 15.6 13.0 - 17.0 g/dL   HCT 46.0 39.0 - 52.0 %  I-STAT, chem 8     Status: Abnormal   Collection Time: 08/03/14  9:41 AM  Result Value Ref Range   Sodium 135 (L) 137 - 147 mEq/L   Potassium 4.7 3.7 - 5.3 mEq/L   Chloride 99 96 - 112 mEq/L   BUN 21 6 - 23 mg/dL   Creatinine, Ser 0.70 0.50 - 1.35 mg/dL   Glucose, Bld 137 (H) 70 - 99 mg/dL   Calcium, Ion 1.24 1.13 - 1.30 mmol/L   TCO2 24 0 - 100 mmol/L   Hemoglobin 13.9 13.0 - 17.0 g/dL   HCT 41.0 39.0 - 52.0 %  I-STAT, chem 8     Status: Abnormal   Collection Time: 08/03/14 10:16 AM  Result Value Ref Range   Sodium 130 (L) 137 - 147 mEq/L   Potassium 4.8 3.7 - 5.3 mEq/L   Chloride 95 (L) 96 - 112 mEq/L   BUN 18 6 - 23 mg/dL   Creatinine, Ser 0.70 0.50 - 1.35 mg/dL   Glucose, Bld 116 (H) 70 - 99 mg/dL   Calcium, Ion 1.04 (L) 1.13 - 1.30 mmol/L   TCO2 24 0 - 100 mmol/L   Hemoglobin 10.5 (L) 13.0 - 17.0 g/dL   HCT 31.0 (L) 39.0 - 52.0 %  I-STAT 3, arterial blood gas (G3+)     Status: Abnormal   Collection Time: 08/03/14 10:21 AM  Result Value Ref Range   pH, Arterial 7.377 7.350 - 7.450   pCO2 arterial 45.9 (H) 35.0 - 45.0 mmHg   pO2, Arterial 400.0 (H) 80.0 - 100.0 mmHg   Bicarbonate 26.9 (H) 20.0 - 24.0 mEq/L   TCO2 28 0 - 100 mmol/L   O2 Saturation 100.0 %   Acid-Base Excess 1.0 0.0 - 2.0 mmol/L   Sample type ARTERIAL   Hemoglobin and hematocrit, blood     Status: Abnormal   Collection Time: 08/03/14 11:15 AM  Result Value Ref Range   Hemoglobin 11.2 (L) 13.0 - 17.0 g/dL    Comment: REPEATED TO VERIFY RESULT CALLED TO, READ BACK BY AND VERIFIED WITH: [place], [place] 08/03/2014  BY MACEDA, J    HCT 32.0 (L) 39.0 - 52.0 %    Comment: REPEATED TO VERIFY RESULT CALLED TO, READ BACK BY AND VERIFIED WITH: Shelda Jakes RN [id number] 08/03/2014 BY MACEDA, J   Platelet count     Status: Abnormal   Collection Time: 08/03/14 11:15 AM  Result  Value Ref Range   Platelets 111 (L) 150 - 400 K/uL    Comment: REPEATED TO VERIFY RESULT CALLED TO, READ BACK BY AND VERIFIED WITH: Shelda Jakes RN [id number] 08/03/2014 BY MACEDA, J PLATELET COUNT CONFIRMED BY SMEAR   I-STAT, chem 8     Status: Abnormal   Collection Time: 08/03/14 11:18 AM  Result Value Ref Range   Sodium 132 (L) 137 - 147 mEq/L   Potassium 4.6 3.7 - 5.3 mEq/L   Chloride 98 96 - 112 mEq/L   BUN 18 6 - 23 mg/dL   Creatinine, Ser 0.70 0.50 - 1.35 mg/dL   Glucose, Bld 123 (H) 70 - 99 mg/dL   Calcium, Ion 1.08 (L) 1.13 - 1.30 mmol/L   TCO2 24 0 - 100 mmol/L   Hemoglobin 10.9 (L) 13.0 - 17.0 g/dL   HCT 32.0 (L) 39.0 - 52.0 %  I-STAT, chem 8     Status: Abnormal   Collection Time: 08/03/14 12:21 PM  Result Value Ref Range   Sodium 132 (L) 137 - 147 mEq/L   Potassium 4.0 3.7 - 5.3 mEq/L   Chloride 101 96 - 112 mEq/L   BUN 16 6 - 23 mg/dL   Creatinine, Ser 0.60 0.50 - 1.35 mg/dL   Glucose, Bld 136 (H) 70 - 99 mg/dL   Calcium, Ion 1.02 (L) 1.13 - 1.30 mmol/L   TCO2 24 0 - 100 mmol/L   Hemoglobin 8.8 (L) 13.0 - 17.0 g/dL   HCT 26.0 (L) 39.0 - 52.0 %  I-STAT 3, arterial blood gas (G3+)     Status: Abnormal   Collection Time: 08/03/14 12:25 PM  Result Value Ref Range   pH, Arterial 7.408 7.350 - 7.450   pCO2 arterial 40.7 35.0 - 45.0 mmHg   pO2, Arterial 325.0 (H) 80.0 - 100.0 mmHg   Bicarbonate 25.7 (H) 20.0 - 24.0 mEq/L   TCO2 27 0 - 100 mmol/L   O2 Saturation 100.0 %   Acid-Base Excess 1.0 0.0 - 2.0 mmol/L   Sample type ARTERIAL   CBC     Status: Abnormal   Collection Time: 08/03/14  1:43 PM  Result Value Ref Range   WBC 7.0 4.0 - 10.5 K/uL   RBC 3.60 (L) 4.22 - 5.81 MIL/uL   Hemoglobin 11.8 (L) 13.0 - 17.0 g/dL    Comment: REPEATED TO VERIFY   HCT 34.1 (L) 39.0 - 52.0 %   MCV 94.7 78.0 - 100.0 fL    Comment: REPEATED TO VERIFY   MCH 32.8 26.0 - 34.0 pg   MCHC 34.6 30.0 - 36.0 g/dL   RDW 13.1 11.5 - 15.5 %   Platelets 109 (L) 150 - 400 K/uL    Comment:  REPEATED TO VERIFY CONSISTENT WITH PREVIOUS RESULT   Protime-INR     Status: Abnormal   Collection Time: 08/03/14  1:43 PM  Result Value Ref Range   Prothrombin Time 16.1 (H) 11.6 - 15.2 seconds   INR 1.27 0.00 - 1.49  APTT     Status: Abnormal   Collection Time: 08/03/14  1:43 PM  Result Value Ref Range   aPTT 38 (H) 24 - 37  seconds    Comment:        IF BASELINE aPTT IS ELEVATED, SUGGEST PATIENT RISK ASSESSMENT BE USED TO DETERMINE APPROPRIATE ANTICOAGULANT THERAPY.   Fibrinogen     Status: None   Collection Time: 08/03/14  1:43 PM  Result Value Ref Range   Fibrinogen 322 204 - 475 mg/dL  I-STAT 4, (NA,K, GLUC, HGB,HCT)     Status: Abnormal   Collection Time: 08/03/14  1:44 PM  Result Value Ref Range   Sodium 136 (L) 137 - 147 mEq/L   Potassium 3.8 3.7 - 5.3 mEq/L   Glucose, Bld 118 (H) 70 - 99 mg/dL   HCT 34.0 (L) 39.0 - 52.0 %   Hemoglobin 11.6 (L) 13.0 - 17.0 g/dL  I-STAT 3, arterial blood gas (G3+)     Status: Abnormal   Collection Time: 08/03/14  1:44 PM  Result Value Ref Range   pH, Arterial 7.380 7.350 - 7.450   pCO2 arterial 38.0 35.0 - 45.0 mmHg   pO2, Arterial 67.0 (L) 80.0 - 100.0 mmHg   Bicarbonate 22.6 20.0 - 24.0 mEq/L   TCO2 24 0 - 100 mmol/L   O2 Saturation 93.0 %   Acid-base deficit 2.0 0.0 - 2.0 mmol/L   Patient temperature 36.6 C    Sample type ARTERIAL   Prepare Pheresed Platelets     Status: None (Preliminary result)   Collection Time: 08/03/14  2:24 PM  Result Value Ref Range   Unit Number [id number]    Blood Component Type PLTPHER LR1    Unit division 00    Status of Unit ISSUED    Transfusion Status OK TO TRANSFUSE   Glucose, capillary     Status: None   Collection Time: 08/03/14  2:52 PM  Result Value Ref Range   Glucose-Capillary 96 70 - 99 mg/dL  Glucose, capillary     Status: Abnormal   Collection Time: 08/03/14  3:55 PM  Result Value Ref Range   Glucose-Capillary 101 (H) 70 - 99 mg/dL  Glucose, capillary     Status: Abnormal     Collection Time: 08/03/14  4:59 PM  Result Value Ref Range   Glucose-Capillary 105 (H) 70 - 99 mg/dL  I-STAT 3, arterial blood gas (G3+)     Status: Abnormal   Collection Time: 08/03/14  5:54 PM  Result Value Ref Range   pH, Arterial 7.303 (L) 7.350 - 7.450   pCO2 arterial 46.7 (H) 35.0 - 45.0 mmHg   pO2, Arterial 102.0 (H) 80.0 - 100.0 mmHg   Bicarbonate 23.1 20.0 - 24.0 mEq/L   TCO2 25 0 - 100 mmol/L   O2 Saturation 97.0 %   Acid-base deficit 3.0 (H) 0.0 - 2.0 mmol/L   Patient temperature 37.0 C    Collection site ARTERIAL LINE    Drawn by Operator    Sample type ARTERIAL   Glucose, capillary     Status: Abnormal   Collection Time: 08/03/14  5:55 PM  Result Value Ref Range   Glucose-Capillary 101 (H) 70 - 99 mg/dL  I-STAT 3, arterial blood gas (G3+)     Status: Abnormal   Collection Time: 08/03/14  7:05 PM  Result Value Ref Range   pH, Arterial 7.329 (L) 7.350 - 7.450   pCO2 arterial 49.4 (H) 35.0 - 45.0 mmHg   pO2, Arterial 82.0 80.0 - 100.0 mmHg   Bicarbonate 26.0 (H) 20.0 - 24.0 mEq/L   TCO2 27 0 - 100 mmol/L   O2 Saturation 95.0 %  Patient temperature 37.2 C    Collection site ARTERIAL LINE    Drawn by Operator    Sample type ARTERIAL   I-STAT, chem 8     Status: Abnormal   Collection Time: 08/03/14  7:09 PM  Result Value Ref Range   Sodium 136 (L) 137 - 147 mEq/L   Potassium 4.7 3.7 - 5.3 mEq/L   Chloride 99 96 - 112 mEq/L   BUN 16 6 - 23 mg/dL   Creatinine, Ser 0.80 0.50 - 1.35 mg/dL   Glucose, Bld 139 (H) 70 - 99 mg/dL   Calcium, Ion 1.17 1.13 - 1.30 mmol/L   TCO2 23 0 - 100 mmol/L   Hemoglobin 10.5 (L) 13.0 - 17.0 g/dL   HCT 31.0 (L) 39.0 - 52.0 %  CBC     Status: Abnormal   Collection Time: 08/03/14  7:30 PM  Result Value Ref Range   WBC 5.7 4.0 - 10.5 K/uL   RBC 3.05 (L) 4.22 - 5.81 MIL/uL   Hemoglobin 9.6 (L) 13.0 - 17.0 g/dL   HCT 28.7 (L) 39.0 - 52.0 %   MCV 94.1 78.0 - 100.0 fL   MCH 31.5 26.0 - 34.0 pg   MCHC 33.4 30.0 - 36.0 g/dL   RDW  13.1 11.5 - 15.5 %   Platelets 138 (L) 150 - 400 K/uL  Magnesium     Status: Abnormal   Collection Time: 08/03/14  7:30 PM  Result Value Ref Range   Magnesium 2.9 (H) 1.5 - 2.5 mg/dL  Creatinine, serum     Status: None   Collection Time: 08/03/14  7:30 PM  Result Value Ref Range   Creatinine, Ser 0.70 0.50 - 1.35 mg/dL   GFR calc non Af Amer >90 >90 mL/min   GFR calc Af Amer >90 >90 mL/min    Comment: (NOTE) The eGFR has been calculated using the CKD EPI equation. This calculation has not been validated in all clinical situations. eGFR's persistently <90 mL/min signify possible Chronic Kidney Disease.   Glucose, capillary     Status: Abnormal   Collection Time: 08/03/14  7:57 PM  Result Value Ref Range   Glucose-Capillary 134 (H) 70 - 99 mg/dL   Comment 1 Capillary Sample   Glucose, capillary     Status: Abnormal   Collection Time: 08/03/14 11:42 PM  Result Value Ref Range   Glucose-Capillary 137 (H) 70 - 99 mg/dL   Comment 1 Capillary Sample   Glucose, capillary     Status: Abnormal   Collection Time: 08/04/14  3:30 AM  Result Value Ref Range   Glucose-Capillary 131 (H) 70 - 99 mg/dL   Comment 1 Notify RN   CBC     Status: Abnormal   Collection Time: 08/04/14  4:13 AM  Result Value Ref Range   WBC 6.1 4.0 - 10.5 K/uL   RBC 3.11 (L) 4.22 - 5.81 MIL/uL   Hemoglobin 9.7 (L) 13.0 - 17.0 g/dL   HCT 29.2 (L) 39.0 - 52.0 %   MCV 93.9 78.0 - 100.0 fL   MCH 31.2 26.0 - 34.0 pg   MCHC 33.2 30.0 - 36.0 g/dL   RDW 13.2 11.5 - 15.5 %   Platelets 146 (L) 150 - 400 K/uL  Basic metabolic panel     Status: Abnormal   Collection Time: 08/04/14  4:13 AM  Result Value Ref Range   Sodium 134 (L) 137 - 147 mEq/L   Potassium 4.9 3.7 - 5.3 mEq/L   Chloride  99 96 - 112 mEq/L   CO2 25 19 - 32 mEq/L   Glucose, Bld 128 (H) 70 - 99 mg/dL   BUN 15 6 - 23 mg/dL   Creatinine, Ser 0.73 0.50 - 1.35 mg/dL   Calcium 8.7 8.4 - 10.5 mg/dL   GFR calc non Af Amer >90 >90 mL/min   GFR calc Af Amer  >90 >90 mL/min    Comment: (NOTE) The eGFR has been calculated using the CKD EPI equation. This calculation has not been validated in all clinical situations. eGFR's persistently <90 mL/min signify possible Chronic Kidney Disease.    Anion gap 10 5 - 15  Magnesium     Status: None   Collection Time: 08/04/14  4:13 AM  Result Value Ref Range   Magnesium 2.2 1.5 - 2.5 mg/dL    Imaging: Imaging results have been reviewed  Tele-Sinus tach  Assessment/Plan:   1. Active Problems: 2.   Protein-calorie malnutrition, severe 3.   ST elevation myocardial infarction (STEMI) of anterolateral wall 4.   Cardiogenic shock 5.   Tobacco use 6.   AKI (acute kidney injury) 7.   Acute systolic heart failure 8.   S/P CABG x 5 9.   Time Spent Directly with Patient:  20 minutes  Length of Stay:  LOS: 8 days   POD #1 CABG X5. Looks great!!! EF 25% with moderate MR. On pressors. CVP line out. Exam benign. Labs OK, No change Rx. Nl transition per TCTS.  Carl [name] 08/04/2014, 8:51 AM

## 2014-08-05 ENCOUNTER — Inpatient Hospital Stay (HOSPITAL_COMMUNITY): Payer: Medicare HMO

## 2014-08-05 LAB — GLUCOSE, CAPILLARY
Glucose-Capillary: 104 mg/dL — ABNORMAL HIGH (ref 70–99)
Glucose-Capillary: 113 mg/dL — ABNORMAL HIGH (ref 70–99)
Glucose-Capillary: 121 mg/dL — ABNORMAL HIGH (ref 70–99)
Glucose-Capillary: 121 mg/dL — ABNORMAL HIGH (ref 70–99)
Glucose-Capillary: 121 mg/dL — ABNORMAL HIGH (ref 70–99)
Glucose-Capillary: 138 mg/dL — ABNORMAL HIGH (ref 70–99)
Glucose-Capillary: 138 mg/dL — ABNORMAL HIGH (ref 70–99)
Glucose-Capillary: 82 mg/dL (ref 70–99)

## 2014-08-05 LAB — CBC
HCT: 27.1 % — ABNORMAL LOW (ref 39.0–52.0)
Hemoglobin: 9 g/dL — ABNORMAL LOW (ref 13.0–17.0)
MCH: 31.4 pg (ref 26.0–34.0)
MCHC: 33.2 g/dL (ref 30.0–36.0)
MCV: 94.4 fL (ref 78.0–100.0)
Platelets: 148 10*3/uL — ABNORMAL LOW (ref 150–400)
RBC: 2.87 MIL/uL — ABNORMAL LOW (ref 4.22–5.81)
RDW: 13.2 % (ref 11.5–15.5)
WBC: 6.3 10*3/uL (ref 4.0–10.5)

## 2014-08-05 LAB — BASIC METABOLIC PANEL
Anion gap: 11 (ref 5–15)
BUN: 13 mg/dL (ref 6–23)
CO2: 25 mEq/L (ref 19–32)
CREATININE: 0.72 mg/dL (ref 0.50–1.35)
Calcium: 8.6 mg/dL (ref 8.4–10.5)
Chloride: 99 mEq/L (ref 96–112)
Glucose, Bld: 122 mg/dL — ABNORMAL HIGH (ref 70–99)
POTASSIUM: 4.9 meq/L (ref 3.7–5.3)
Sodium: 135 mEq/L — ABNORMAL LOW (ref 137–147)

## 2014-08-05 LAB — POCT I-STAT, CHEM 8
BUN: 15 mg/dL (ref 6–23)
Calcium, Ion: 1.22 mmol/L (ref 1.13–1.30)
Chloride: 96 mEq/L (ref 96–112)
Creatinine, Ser: 0.8 mg/dL (ref 0.50–1.35)
Glucose, Bld: 97 mg/dL (ref 70–99)
HCT: 32 % — ABNORMAL LOW (ref 39.0–52.0)
Hemoglobin: 10.9 g/dL — ABNORMAL LOW (ref 13.0–17.0)
Potassium: 4.4 mEq/L (ref 3.7–5.3)
Sodium: 135 mEq/L — ABNORMAL LOW (ref 137–147)
TCO2: 25 mmol/L (ref 0–100)

## 2014-08-05 MED ORDER — DIGOXIN 0.25 MG/ML IJ SOLN
0.2500 mg | Freq: Every day | INTRAMUSCULAR | Status: AC
  Administered 2014-08-05: 0.25 mg via INTRAVENOUS
  Filled 2014-08-05: qty 1

## 2014-08-05 MED ORDER — OXYCODONE HCL 5 MG PO TABS
5.0000 mg | ORAL_TABLET | ORAL | Status: DC | PRN
  Administered 2014-08-05 – 2014-08-08 (×6): 5 mg via ORAL
  Filled 2014-08-05 (×6): qty 1

## 2014-08-05 MED ORDER — DIGOXIN 125 MCG PO TABS
0.1250 mg | ORAL_TABLET | Freq: Every day | ORAL | Status: DC
  Administered 2014-08-06 – 2014-08-17 (×12): 0.125 mg via ORAL
  Filled 2014-08-05 (×12): qty 1

## 2014-08-05 NOTE — Evaluation (Signed)
Physical Therapy Evaluation Patient Details Name: BERTHOLD GLACE MRN: [id number] DOB: Nov 08, 1950 Today's Date: 08/05/2014   History of Present Illness  Mr Karnes was admitted with 2-3 weeks of not feeling well.  On admission determined to have suffered a STEMI,  Pt now s/p CABG x5  Clinical Impression  Pt admitted with/for CABG x5.  Pt currently limited functionally due to the problems listed. ( See problems list.)   Pt will benefit from PT to maximize function and safety in order to get ready for next venue listed below.     Follow Up Recommendations SNF    Equipment Recommendations  None recommended by PT (TBA )    Recommendations for Other Services       Precautions / Restrictions Precautions Precautions: Fall      Mobility  Bed Mobility Overal bed mobility: Needs Assistance Bed Mobility: Supine to Sit     Supine to sit: Min assist     General bed mobility comments: cues for technque and sternal precautions,  Pt scooted to EOB sithout use of UE's  Transfers Overall transfer level: Needs assistance Equipment used:  (W/C to push) Transfers: Sit to/from Stand Sit to Stand: Min assist         General transfer comment: cues to sternal precautions  Ambulation/Gait Ambulation/Gait assistance: Min guard Ambulation Distance (Feet): 300 Feet Assistive device:  (w/c to push) Gait Pattern/deviations: Step-through pattern;Trunk flexed Gait velocity: slow   General Gait Details: generally steady, but guarded and slow  Stairs            Wheelchair Mobility    Modified Rankin (Stroke Patients Only)       Balance Overall balance assessment: Needs assistance Sitting-balance support: Feet supported;No upper extremity supported Sitting balance-Leahy Scale: Fair                                       Pertinent Vitals/Pain Pain Assessment: Faces Faces Pain Scale: Hurts little more Pain Location: chest Pain Intervention(s): Monitored during  session;Limited activity within patient's tolerance    Home Living Family/patient expects to be discharged to:: Unsure Living Arrangements: Parent Available Help at Discharge: Family;Other (Comment) (pt's Mom) Type of Home: House           Additional Comments: Has his own home, but spends every night with his Mom.  His Mom will be the main caregiver once home.  She needs him to be independent and wants him to have some rehab before coming home.    Prior Function Level of Independence: Independent               Hand Dominance        Extremity/Trunk Assessment   Upper Extremity Assessment: Overall WFL for tasks assessed           Lower Extremity Assessment: Overall WFL for tasks assessed (mildly weak in general, but functional)         Communication   Communication: No difficulties  Cognition Arousal/Alertness: Awake/alert Behavior During Therapy: WFL for tasks assessed/performed Overall Cognitive Status: Within Functional Limits for tasks assessed                      General Comments General comments (skin integrity, edema, etc.): reinforced sternal precautions;  BP 87/50's, sats on RA 97%    Exercises        Assessment/Plan    PT Assessment  Patient needs continued PT services  PT Diagnosis Difficulty walking   PT Problem List Decreased strength;Decreased activity tolerance;Decreased mobility;Decreased knowledge of use of DME;Decreased knowledge of precautions;Pain  PT Treatment Interventions Gait training;DME instruction;Functional mobility training;Therapeutic activities;Balance training;Cognitive remediation   PT Goals (Current goals can be found in the Care Plan section) Acute Rehab PT Goals Patient Stated Goal: pt did not state. PT Goal Formulation: With patient Time For Goal Achievement: 08/19/14 Potential to Achieve Goals: Good    Frequency Min 3X/week   Barriers to discharge        Co-evaluation               End of  Session   Activity Tolerance: Patient tolerated treatment well Patient left: in bed;with call bell/phone within reach Nurse Communication: Mobility status         Time: 1645-1710 PT Time Calculation (min) (ACUTE ONLY): 25 min   Charges:   PT Evaluation $Initial PT Evaluation Tier I: 1 Procedure PT Treatments $Gait Training: 8-22 mins   PT G Codes:          Brynlie Daza, Tessie Fass 08/05/2014, 5:22 PM 08/05/2014  Donnella Sham, PT [phone]  (pager)

## 2014-08-05 NOTE — Progress Notes (Signed)
CT surgery p.m. Rounds  Patient maintaining sinus rhythm Phenylephrine has been weaned slightly today Renal dose dopamine continues-urine output improved today Vital signs are stable-patient out of bed to chair for much of the day-did some gait training with physical therapy Continue to attempt weaning inotropes We'll remove chest tubes in a.m.

## 2014-08-05 NOTE — Progress Notes (Signed)
2 Days Post-Op Procedure(s) (LRB): CORONARY ARTERY BYPASS GRAFTING (CABG) (N/A) TRANSESOPHAGEAL ECHOCARDIOGRAM (TEE) (N/A) Subjective: OOB to chair Still on low doe Neo Sinus tach Will start po dig  Objective: Vital signs in last 24 hours: Temp:  [97.3 F (36.3 C)-98.8 F (37.1 C)] 97.3 F (36.3 C) (12/12 0700) Pulse Rate:  [101-120] 102 (12/12 0700) Cardiac Rhythm:  [-] Sinus tachycardia (12/11 2000) Resp:  [0-28] 13 (12/12 0700) BP: (78-96)/(41-70) 83/56 mmHg (12/12 0700) SpO2:  [93 %-100 %] 99 % (12/12 0700) Arterial Line BP: (86-125)/(41-67) 94/41 mmHg (12/12 0700) Weight:  [158 lb 4.6 oz (71.8 kg)] 158 lb 4.6 oz (71.8 kg) (12/12 0500)  Hemodynamic parameters for last 24 hours:  stable  Intake/Output from previous day: 12/11 0701 - 12/12 0700 In: 1556.7 [P.O.:780; I.V.:676.7; IV Piggyback:100] Out: 2245 [Urine:1785; Chest Tube:460] Intake/Output this shift:    lungs clear  Lab Results:  Recent Labs  08/04/14 1600 08/04/14 1654 08/05/14 0400  WBC 6.2  --  6.3  HGB 9.5* 9.9* 9.0*  HCT 28.5* 29.0* 27.1*  PLT 140*  --  148*   BMET:  Recent Labs  08/04/14 0413  08/04/14 1654 08/05/14 0400  NA 134*  --  131* 135*  K 4.9  --  4.8 4.9  CL 99  --  96 99  CO2 25  --   --  25  GLUCOSE 128*  --  155* 122*  BUN 15  --  14 13  CREATININE 0.73  < > 0.80 0.72  CALCIUM 8.7  --   --  8.6  < > = values in this interval not displayed.  PT/INR:  Recent Labs  08/03/14 1343  LABPROT 16.1*  INR 1.27   ABG    Component Value Date/Time   PHART 7.329* 08/03/2014 1905   HCO3 26.0* 08/03/2014 1905   TCO2 23 08/04/2014 1654   ACIDBASEDEF 3.0* 08/03/2014 1754   O2SAT 95.0 08/03/2014 1905   CBG (last 3)   Recent Labs  08/04/14 1537 08/04/14 1946 08/05/14 0001  GLUCAP 114* 152* 113*    Assessment/Plan: S/P Procedure(s) (LRB): CORONARY ARTERY BYPASS GRAFTING (CABG) (N/A) TRANSESOPHAGEAL ECHOCARDIOGRAM (TEE) (N/A) Wean drips Start digoxin   LOS: 9 days     Carl [name],Carl [name] 08/05/2014

## 2014-08-06 ENCOUNTER — Inpatient Hospital Stay (HOSPITAL_COMMUNITY): Payer: Medicare HMO

## 2014-08-06 LAB — CBC
HCT: 27.5 % — ABNORMAL LOW (ref 39.0–52.0)
Hemoglobin: 9.2 g/dL — ABNORMAL LOW (ref 13.0–17.0)
MCH: 32.3 pg (ref 26.0–34.0)
MCHC: 33.5 g/dL (ref 30.0–36.0)
MCV: 96.5 fL (ref 78.0–100.0)
Platelets: 179 10*3/uL (ref 150–400)
RBC: 2.85 MIL/uL — ABNORMAL LOW (ref 4.22–5.81)
RDW: 13 % (ref 11.5–15.5)
WBC: 5.8 10*3/uL (ref 4.0–10.5)

## 2014-08-06 LAB — COMPREHENSIVE METABOLIC PANEL
ALT: 67 U/L — ABNORMAL HIGH (ref 0–53)
AST: 42 U/L — ABNORMAL HIGH (ref 0–37)
Albumin: 2.7 g/dL — ABNORMAL LOW (ref 3.5–5.2)
Alkaline Phosphatase: 56 U/L (ref 39–117)
Anion gap: 10 (ref 5–15)
BUN: 15 mg/dL (ref 6–23)
CO2: 27 mEq/L (ref 19–32)
Calcium: 8.6 mg/dL (ref 8.4–10.5)
Chloride: 97 mEq/L (ref 96–112)
Creatinine, Ser: 0.66 mg/dL (ref 0.50–1.35)
GFR calc Af Amer: >90 mL/min (ref >90)
GFR calc non Af Amer: >90 mL/min (ref >90)
Glucose, Bld: 88 mg/dL (ref 70–99)
Potassium: 4.4 mEq/L (ref 3.7–5.3)
Sodium: 134 mEq/L — ABNORMAL LOW (ref 137–147)
Total Bilirubin: 0.7 mg/dL (ref 0.3–1.2)
Total Protein: 5.4 g/dL — ABNORMAL LOW (ref 6.0–8.3)

## 2014-08-06 LAB — GLUCOSE, CAPILLARY
Glucose-Capillary: 80 mg/dL (ref 70–99)
Glucose-Capillary: 89 mg/dL (ref 70–99)
Glucose-Capillary: 101 mg/dL — ABNORMAL HIGH (ref 70–99)
Glucose-Capillary: 90 mg/dL (ref 70–99)
Glucose-Capillary: 101 mg/dL — ABNORMAL HIGH (ref 70–99)
Glucose-Capillary: 101 mg/dL — ABNORMAL HIGH (ref 70–99)

## 2014-08-06 MED ORDER — FUROSEMIDE 40 MG PO TABS
40.0000 mg | ORAL_TABLET | Freq: Every day | ORAL | Status: DC
  Administered 2014-08-06 – 2014-08-07 (×2): 40 mg via ORAL
  Filled 2014-08-06 (×3): qty 1

## 2014-08-06 NOTE — Progress Notes (Signed)
3 Days Post-Op Procedure(s) (LRB): CORONARY ARTERY BYPASS GRAFTING (CABG) (N/A) TRANSESOPHAGEAL ECHOCARDIOGRAM (TEE) (N/A) Subjective: Patient has been out of bed to chair and appears comfortable He is minimally verbal during rounds He remains dependent on low-dose dopamine and phenylephrine to maintain mean arterial pressure greater than 60 Weight is slightly increased, we will start oral Lasix daily for history of CHF Maintaining sinus rhythm Chest x-rays with some mild right lower lobe subsegmental atelectasis Patient needs physical therapy and assistance with daily ambulation and will advance diet Diabetic control satisfactory  Objective: Vital signs in last 24 hours: Temp:  [97.4 F (36.3 C)-98.1 F (36.7 C)] 97.4 F (36.3 C) (12/13 1100) Pulse Rate:  [94-112] 103 (12/13 1200) Cardiac Rhythm:  [-] Normal sinus rhythm;Sinus tachycardia (12/13 1200) Resp:  [9-30] 12 (12/13 1200) BP: (68-94)/(47-65) 90/60 mmHg (12/13 1200) SpO2:  [92 %-99 %] 95 % (12/13 1200) Arterial Line BP: (72-111)/(51-78) 94/56 mmHg (12/13 0300) Weight:  [152 lb 1.9 oz (69 kg)] 152 lb 1.9 oz (69 kg) (12/13 0500)  Hemodynamic parameters for last 24 hours:   afebrile stable  Intake/Output from previous day: 12/12 0701 - 12/13 0700 In: 1562.4 [P.O.:720; I.V.:842.4] Out: 2075 [Urine:1905; Chest Tube:170] Intake/Output this shift: Total I/O In: 403.2 [P.O.:240; I.V.:163.2] Out: -   Diminished breath sounds at right base Extremities warm Moves all extremities  Lab Results:  Recent Labs  08/05/14 0400 08/05/14 1820 08/06/14 0330  WBC 6.3  --  5.8  HGB 9.0* 10.9* 9.2*  HCT 27.1* 32.0* 27.5*  PLT 148*  --  179   BMET:  Recent Labs  08/05/14 0400 08/05/14 1820 08/06/14 0330  NA 135* 135* 134*  K 4.9 4.4 4.4  CL 99 96 97  CO2 25  --  27  GLUCOSE 122* 97 88  BUN 13 15 15   CREATININE 0.72 0.80 0.66  CALCIUM 8.6  --  8.6    PT/INR:  Recent Labs  08/03/14 1343  LABPROT 16.1*  INR  1.27   ABG    Component Value Date/Time   PHART 7.329* 08/03/2014 1905   HCO3 26.0* 08/03/2014 1905   TCO2 25 08/05/2014 1820   ACIDBASEDEF 3.0* 08/03/2014 1754   O2SAT 95.0 08/03/2014 1905   CBG (last 3)   Recent Labs  08/06/14 0305 08/06/14 0856 08/06/14 1120  GLUCAP 89 90 101*    Assessment/Plan: S/P Procedure(s) (LRB): CORONARY ARTERY BYPASS GRAFTING (CABG) (N/A) TRANSESOPHAGEAL ECHOCARDIOGRAM (TEE) (N/A) Mobilize Wean pressors as tolerated, start oral Lasix, advance diet, start physical therapy Monday  LOS: 10 days    VAN TRIGT III,Matthew Cina 08/06/2014

## 2014-08-06 NOTE — Progress Notes (Signed)
CT surgery p.m. Rounds  Patient ambulated in the hallway 2 Maintaining sinus rhythm Continues to be dependent on low-dose phenylephrine and dopamine to maintain mean arterial pressure greater than 60 Urine output improved with oral Lasix

## 2014-08-06 NOTE — Progress Notes (Signed)
Pt had foley catheter removed at 1345 this afternoon, patient has been able to void but only small amounts and still felt need to void. Bladder scan obtained, results>92ml in bladder. Reinserted 14Fr Foley catheter, put out 991ml urine immediately.

## 2014-08-07 ENCOUNTER — Inpatient Hospital Stay (HOSPITAL_COMMUNITY): Payer: Medicare HMO

## 2014-08-07 LAB — CBC
HCT: 29.2 % — ABNORMAL LOW (ref 39.0–52.0)
Hemoglobin: 9.6 g/dL — ABNORMAL LOW (ref 13.0–17.0)
MCH: 30.8 pg (ref 26.0–34.0)
MCHC: 32.9 g/dL (ref 30.0–36.0)
MCV: 93.6 fL (ref 78.0–100.0)
Platelets: 242 10*3/uL (ref 150–400)
RBC: 3.12 MIL/uL — ABNORMAL LOW (ref 4.22–5.81)
RDW: 13.1 % (ref 11.5–15.5)
WBC: 5.8 10*3/uL (ref 4.0–10.5)

## 2014-08-07 LAB — CARBOXYHEMOGLOBIN
Carboxyhemoglobin: 1.7 % — ABNORMAL HIGH (ref 0.5–1.5)
Methemoglobin: 0.6 % (ref 0.0–1.5)
O2 Saturation: 63.5 %
Total hemoglobin: 9.9 g/dL — ABNORMAL LOW (ref 13.5–18.0)

## 2014-08-07 LAB — GLUCOSE, CAPILLARY
Glucose-Capillary: 91 mg/dL (ref 70–99)
Glucose-Capillary: 95 mg/dL (ref 70–99)
Glucose-Capillary: 99 mg/dL (ref 70–99)
Glucose-Capillary: 99 mg/dL (ref 70–99)
Glucose-Capillary: 99 mg/dL (ref 70–99)

## 2014-08-07 LAB — BASIC METABOLIC PANEL
Anion gap: 9 (ref 5–15)
BUN: 14 mg/dL (ref 6–23)
CO2: 29 mEq/L (ref 19–32)
Calcium: 8.9 mg/dL (ref 8.4–10.5)
Chloride: 94 mEq/L — ABNORMAL LOW (ref 96–112)
Creatinine, Ser: 0.8 mg/dL (ref 0.50–1.35)
GFR calc Af Amer: >90 mL/min (ref >90)
GFR calc non Af Amer: >90 mL/min (ref >90)
Glucose, Bld: 102 mg/dL — ABNORMAL HIGH (ref 70–99)
Potassium: 4.1 mEq/L (ref 3.7–5.3)
Sodium: 132 mEq/L — ABNORMAL LOW (ref 137–147)

## 2014-08-07 MED ORDER — ALBUMIN HUMAN 5 % IV SOLN
12.5000 g | Freq: Once | INTRAVENOUS | Status: AC
  Administered 2014-08-07: 12.5 g via INTRAVENOUS
  Filled 2014-08-07: qty 250

## 2014-08-07 MED ORDER — MIDODRINE HCL 5 MG PO TABS
10.0000 mg | ORAL_TABLET | Freq: Three times a day (TID) | ORAL | Status: DC
Start: 2014-08-07 — End: 2014-08-17
  Administered 2014-08-07 – 2014-08-17 (×29): 10 mg via ORAL
  Filled 2014-08-07 (×33): qty 2

## 2014-08-07 NOTE — Progress Notes (Signed)
POD # 4  Still on low dose neo and dopamine  BP 85/49 mmHg  Pulse 90  Temp(Src) 97.9 F (36.6 C) (Oral)  Resp 13  Ht 6' (1.829 m)  Wt 147 lb 0.8 oz (66.7 kg)  BMI 19.94 kg/m2  SpO2 98%   Intake/Output Summary (Last 24 hours) at 08/07/14 2028 Last data filed at 08/07/14 2000  Gross per 24 hour  Intake 1335.3 ml  Output   3375 ml  Net -2039.7 ml    Continue to wean pressors as tolerated

## 2014-08-07 NOTE — Progress Notes (Signed)
4 Days Post-Op Procedure(s) (LRB): CORONARY ARTERY BYPASS GRAFTING (CABG) (N/A) TRANSESOPHAGEAL ECHOCARDIOGRAM (TEE) (N/A) Subjective:  No complaints  Objective: Vital signs in last 24 hours: Temp:  [97.3 F (36.3 C)-97.8 F (36.6 C)] 97.3 F (36.3 C) (12/14 1600) Pulse Rate:  [76-104] 85 (12/14 1500) Cardiac Rhythm:  [-] Normal sinus rhythm (12/14 1400) Resp:  [13-25] 15 (12/14 1500) BP: (59-92)/(33-66) 78/51 mmHg (12/14 1500) SpO2:  [91 %-100 %] 97 % (12/14 1500) Weight:  [66.7 kg (147 lb 0.8 oz)] 66.7 kg (147 lb 0.8 oz) (12/14 0600)  Hemodynamic parameters for last 24 hours:    Intake/Output from previous day: 12/13 0701 - 12/14 0700 In: 1531.8 [P.O.:900; I.V.:631.8] Out: 3310 [Urine:3310] Intake/Output this shift: Total I/O In: 377 [P.O.:240; I.V.:137] Out: 640 [Urine:640]  General appearance: alert and cooperative Heart: regular rate and rhythm, S1, S2 normal, no murmur, click, rub or gallop Lungs: diminished breath sounds bibasilar Extremities: extremities normal, atraumatic, no cyanosis or edema Wound: incision ok  Lab Results:  Recent Labs  08/06/14 0330 08/07/14 0223  WBC 5.8 5.8  HGB 9.2* 9.6*  HCT 27.5* 29.2*  PLT 179 242   BMET:  Recent Labs  08/06/14 0330 08/07/14 0223  NA 134* 132*  K 4.4 4.1  CL 97 94*  CO2 27 29  GLUCOSE 88 102*  BUN 15 14  CREATININE 0.66 0.80  CALCIUM 8.6 8.9    PT/INR: No results for input(s): LABPROT, INR in the last 72 hours. ABG    Component Value Date/Time   PHART 7.329* 08/03/2014 1905   HCO3 26.0* 08/03/2014 1905   TCO2 25 08/05/2014 1820   ACIDBASEDEF 3.0* 08/03/2014 1754   O2SAT 63.5 08/07/2014 0350   CBG (last 3)   Recent Labs  08/07/14 0715 08/07/14 1314 08/07/14 1531  GLUCAP 99 95 99    Assessment/Plan: S/P Procedure(s) (LRB): CORONARY ARTERY BYPASS GRAFTING (CABG) (N/A) TRANSESOPHAGEAL ECHOCARDIOGRAM (TEE) (N/A) He remains vasodilated requiring dopamine 5 and neo 15 mcg to maintain a  marginal BP of 90. Will add midodrine to see if that will raise BP and allow vasopressors to be weaned. His weight is close to his preop baseline so will hold off on further diuresis.  Continue IS and ambulation with PT.   LOS: 11 days    Taneah Masri K 08/07/2014

## 2014-08-07 NOTE — Progress Notes (Signed)
UR Completed.  [phone]  

## 2014-08-07 NOTE — Progress Notes (Signed)
Physical Therapy Treatment Patient Details Name: Carl [name] MRN: [id number] DOB: January 31, 1951 Today's Date: 08/07/2014    History of Present Illness Mr Gibeault was admitted with 2-3 weeks of not feeling well.  On admission determined to have suffered a STEMI,  Pt now s/p CABG x5    PT Comments    Patient with min guard for all mobility today but with complaints of lightheadedness throughout session.  BP was low but within acceptable parameters for mobility.  Pt stated no pain at start of session but indicated some pain in chest at end of session.  Pt unable to state sternal precautions and is attempting to pull on chair arms to scoot towards edge of chair.  Will continue to follow to improve patient safety and balance and for increased independence with functional mobility.   Follow Up Recommendations  SNF     Equipment Recommendations  None recommended by PT    Recommendations for Other Services       Precautions / Restrictions Precautions Precautions: Fall;Sternal    Mobility  Bed Mobility               General bed mobility comments: pt in chair on arrival and end of session  Transfers Overall transfer level: Needs assistance Equipment used: Rolling walker (2 wheeled) Transfers: Sit to/from Stand Sit to Stand: Min guard         General transfer comment: cues for hand placement and sternal precautions  Ambulation/Gait Ambulation/Gait assistance: Min guard Ambulation Distance (Feet): 300 Feet Assistive device: Rolling walker (2 wheeled);None Gait Pattern/deviations: Step-through pattern;Trunk flexed;Decreased stride length   Gait velocity interpretation: Below normal speed for age/gender General Gait Details: decreased stride and speed with cues for posture and position in RW. Pt walked first 44' with RW but stated he didn't need it and completed gait without DME with increased speed   Stairs            Wheelchair Mobility    Modified Rankin  (Stroke Patients Only)       Balance Overall balance assessment: Needs assistance   Sitting balance-Leahy Scale: Good       Standing balance-Leahy Scale: Good                      Cognition                            Exercises General Exercises - Lower Extremity Long Arc Quad: AROM;Seated;Both;10 reps Hip Flexion/Marching: AROM;Seated;Both;10 reps    General Comments        Pertinent Vitals/Pain Pain Assessment: No/denies pain  BP start of session: 88/57 (63) BP end of session: 102/64 HR 83-117 SpO2: 96% (RA)    Home Living                      Prior Function            PT Goals (current goals can now be found in the care plan section) Progress towards PT goals: Progressing toward goals    Frequency  Min 3X/week    PT Plan Current plan remains appropriate    Co-evaluation             End of Session Equipment Utilized During Treatment: Gait belt Activity Tolerance: Patient tolerated treatment well Patient left: in chair;with call bell/phone within reach     Time: 0805-0836 PT Time Calculation (min) (ACUTE ONLY): 31 min  Charges:  $Gait Training: 8-22 mins $Therapeutic Activity: 8-22 mins                    G Codes:      Mekisha Bittel SPT 08/07/2014, 9:17 AM

## 2014-08-08 DIAGNOSIS — I059 Rheumatic mitral valve disease, unspecified: Secondary | ICD-10-CM

## 2014-08-08 LAB — GLUCOSE, CAPILLARY
Glucose-Capillary: 98 mg/dL (ref 70–99)
Glucose-Capillary: 110 mg/dL — ABNORMAL HIGH (ref 70–99)
Glucose-Capillary: 94 mg/dL (ref 70–99)

## 2014-08-08 NOTE — Clinical Social Work Note (Signed)
Received consult for social work, met with patient to discuss going to a SNF for short term rehab once he is medically ready and discharge orders have been given.  Patient is in agreement to going to a SNF for rehab, and asked for CSW to contact his brother to discuss SNF placement for short term rehab.  Jones Broom. [place], MSW, [place] 08/08/2014 4:14 PM

## 2014-08-08 NOTE — Progress Notes (Signed)
*  PRELIMINARY RESULTS* Echocardiogram 2D Echocardiogram has been performed.  Leavy Cella 08/08/2014, 4:17 PM

## 2014-08-08 NOTE — Progress Notes (Signed)
5 Days Post-Op Procedure(s) (LRB): CORONARY ARTERY BYPASS GRAFTING (CABG) (N/A) TRANSESOPHAGEAL ECHOCARDIOGRAM (TEE) (N/A) Subjective:  No complaints  Objective: Vital signs in last 24 hours: Temp:  [97.3 F (36.3 C)-98.1 F (36.7 C)] 98.1 F (36.7 C) (12/15 1100) Pulse Rate:  [79-107] 100 (12/15 1215) Cardiac Rhythm:  [-] Normal sinus rhythm (12/15 1200) Resp:  [12-34] 20 (12/15 1215) BP: (72-105)/(27-73) 89/55 mmHg (12/15 1200) SpO2:  [91 %-100 %] 98 % (12/15 1215) Weight:  [71 kg (156 lb 8.4 oz)] 71 kg (156 lb 8.4 oz) (12/15 0404)  Hemodynamic parameters for last 24 hours:    Intake/Output from previous day: 12/14 0701 - 12/15 0700 In: 1449.3 [P.O.:480; I.V.:719.3; IV Piggyback:250] Out: 2355 [Urine:2355] Intake/Output this shift: Total I/O In: 476.5 [P.O.:360; I.V.:116.5] Out: 205 [Urine:205]  General appearance: alert and cooperative Neurologic: intact Heart: regular rate and rhythm, S1, S2 normal, no murmur, click, rub or gallop Lungs: diminished breath sounds RLL Extremities: extremities normal, atraumatic, no cyanosis or edema Wound: incision ok  Lab Results:  Recent Labs  08/06/14 0330 08/07/14 0223  WBC 5.8 5.8  HGB 9.2* 9.6*  HCT 27.5* 29.2*  PLT 179 242   BMET:  Recent Labs  08/06/14 0330 08/07/14 0223  NA 134* 132*  K 4.4 4.1  CL 97 94*  CO2 27 29  GLUCOSE 88 102*  BUN 15 14  CREATININE 0.66 0.80  CALCIUM 8.6 8.9    PT/INR: No results for input(s): LABPROT, INR in the last 72 hours. ABG    Component Value Date/Time   PHART 7.329* 08/03/2014 1905   HCO3 26.0* 08/03/2014 1905   TCO2 25 08/05/2014 1820   ACIDBASEDEF 3.0* 08/03/2014 1754   O2SAT 63.5 08/07/2014 0350   CBG (last 3)   Recent Labs  08/07/14 1531 08/08/14 0744 08/08/14 1113  GLUCAP 99 98 110*    Assessment/Plan: S/P Procedure(s) (LRB): CORONARY ARTERY BYPASS GRAFTING (CABG) (N/A) TRANSESOPHAGEAL ECHOCARDIOGRAM (TEE) (N/A)  He remains dependent of dop 5 and  neo to maintain marginally adequate BP. I started him on Midodrine last pm to see if that would help him but it hasn't so far. Will repeat echo to assess the LV and rule out pericardial effusion. I think he is probably euvolemic.  Continue mobilization as possible.   LOS: 12 days    BARTLE,BRYAN K 08/08/2014

## 2014-08-09 ENCOUNTER — Inpatient Hospital Stay (HOSPITAL_COMMUNITY): Payer: Medicare HMO

## 2014-08-09 LAB — COMPREHENSIVE METABOLIC PANEL
ALBUMIN: 2.9 g/dL — AB (ref 3.5–5.2)
ALK PHOS: 77 U/L (ref 39–117)
ALT: 65 U/L — AB (ref 0–53)
AST: 49 U/L — AB (ref 0–37)
Anion gap: 13 (ref 5–15)
BUN: 17 mg/dL (ref 6–23)
CALCIUM: 9 mg/dL (ref 8.4–10.5)
CO2: 27 mEq/L (ref 19–32)
Chloride: 96 mEq/L (ref 96–112)
Creatinine, Ser: 0.81 mg/dL (ref 0.50–1.35)
GFR calc non Af Amer: >90 mL/min (ref >90)
GLUCOSE: 97 mg/dL (ref 70–99)
POTASSIUM: 4.6 meq/L (ref 3.7–5.3)
Sodium: 136 mEq/L — ABNORMAL LOW (ref 137–147)
Total Bilirubin: 1 mg/dL (ref 0.3–1.2)
Total Protein: 6 g/dL (ref 6.0–8.3)

## 2014-08-09 LAB — CBC
HCT: 30.6 % — ABNORMAL LOW (ref 39.0–52.0)
HEMOGLOBIN: 10.1 g/dL — AB (ref 13.0–17.0)
MCH: 31.2 pg (ref 26.0–34.0)
MCHC: 33 g/dL (ref 30.0–36.0)
MCV: 94.4 fL (ref 78.0–100.0)
PLATELETS: 364 10*3/uL (ref 150–400)
RBC: 3.24 MIL/uL — ABNORMAL LOW (ref 4.22–5.81)
RDW: 13.3 % (ref 11.5–15.5)
WBC: 6.4 10*3/uL (ref 4.0–10.5)

## 2014-08-09 LAB — CARBOXYHEMOGLOBIN
CARBOXYHEMOGLOBIN: 1.8 % — AB (ref 0.5–1.5)
METHEMOGLOBIN: 0.9 % (ref 0.0–1.5)
O2 SAT: 63.8 %
Total hemoglobin: 10.3 g/dL — ABNORMAL LOW (ref 13.5–18.0)

## 2014-08-09 MED ORDER — BOOST / RESOURCE BREEZE PO LIQD
1.0000 | Freq: Two times a day (BID) | ORAL | Status: DC
  Administered 2014-08-10 – 2014-08-17 (×13): 1 via ORAL
  Filled 2014-08-09: qty 1

## 2014-08-09 MED ORDER — SODIUM CHLORIDE 0.9 % IV BOLUS (SEPSIS)
500.0000 mL | Freq: Once | INTRAVENOUS | Status: AC
  Administered 2014-08-09: 500 mL via INTRAVENOUS

## 2014-08-09 MED ORDER — SODIUM CHLORIDE 0.9 % IV BOLUS (SEPSIS): 250.0000 mL | Freq: Once | INTRAVENOUS | Status: DC

## 2014-08-09 MED ORDER — DOBUTAMINE IN D5W 4-5 MG/ML-% IV SOLN
2.0000 ug/kg/min | INTRAVENOUS | Status: DC
Start: 2014-08-09 — End: 2014-08-11
  Administered 2014-08-09: 2.5 ug/kg/min via INTRAVENOUS
  Filled 2014-08-09 (×2): qty 250

## 2014-08-09 MED ORDER — ENSURE COMPLETE PO LIQD
237.0000 mL | Freq: Every day | ORAL | Status: DC
  Administered 2014-08-09 – 2014-08-16 (×6): 237 mL via ORAL

## 2014-08-09 NOTE — Progress Notes (Signed)
Resting comfortably  BP 90/58 mmHg  Pulse 102  Temp(Src) 98.3 F (36.8 C) (Oral)  Resp 24  Ht 6' (1.829 m)  Wt 145 lb 15.1 oz (66.2 kg)  BMI 19.79 kg/m2  SpO2 98%   Intake/Output Summary (Last 24 hours) at 08/09/14 1748 Last data filed at 08/09/14 1738  Gross per 24 hour  Intake   1486 ml  Output   2025 ml  Net   -539 ml    BP a little better with dobutamine.  Continue current care

## 2014-08-09 NOTE — Progress Notes (Signed)
NUTRITION CONSULT/FOLLOW UP  DOCUMENTATION CODES Per approved criteria  -Severe malnutrition in the context of chronic illness   INTERVENTION:  Ensure Complete po daily, each supplement provides 350 kcal and 13 grams of protein  [employer] po BID, each supplement provides 250 kcal and 9 grams of protein RD to follow for nutrition care plan  NUTRITION DIAGNOSIS: Malnutrition related to chronic illness as evidenced by severe fat and muscle depletion, ongoing  Goal: Pt to meet >/= 90% of their estimated nutrition needs, progressing  Monitor:  PO & supplemental intake, weight, labs, I/O's  ASSESSMENT: Pt seen at [facility] due to SOB. Cath on 12/3 showed 5 vessel disease (>90%), tx to [facility] for CABG eval.  Pt admitted with anteroseptal ST elevation myocardial infarction with late presentation. Pt lives at home with his mom, hx of mild mental retardation.   Patient s/p procedures 12/10: CORONARY ARTERY BYPASS GRAFTING (CABG) x 5 TRANSESOPHAGEAL ECHOCARDIOGRAM (TEE) EVH: LEFT LEG  RD consulted for poor PO intake.  Pt currently on a Heart Healthy/Carbohydrate Modified diet.  Reports a poor appetite.  PO intake 0-10% per flowsheet records.  Has Ensure Complete supplement on tray table.  He states he's drinking a little bit.  Amenable to trying [employer].  Order already in place.  Height: Ht Readings from Last 1 Encounters:  08/03/14 6' (1.829 m)    Weight: Wt Readings from Last 1 Encounters:  08/09/14 145 lb 15.1 oz (66.2 kg)    BMI:  Body mass index is 19.79 kg/(m^2).  Estimated Nutritional Needs: Kcal: 2000-2200 Protein: 100-115 grams Fluid: > 2 L/day  Skin: skin abrasion  Diet Order: Diet heart healthy/carb modified   Intake/Output Summary (Last 24 hours) at 08/09/14 1550 Last data filed at 08/09/14 1500  Gross per 24 hour  Intake    868 ml  Output   1375 ml  Net   -507 ml    Labs:   Recent Labs Lab 08/03/14 1930 08/04/14 0413  08/04/14 1600  08/06/14 0330 08/07/14 0223 08/09/14 1015  NA  --  134*  --   < > 134* 132* 136*  K  --  4.9  --   < > 4.4 4.1 4.6  CL  --  99  --   < > 97 94* 96  CO2  --  25  --   < > 27 29 27   BUN  --  15  --   < > 15 14 17   CREATININE 0.70 0.73 0.78  < > 0.66 0.80 0.81  CALCIUM  --  8.7  --   < > 8.6 8.9 9.0  MG 2.9* 2.2 1.9  --   --   --   --   GLUCOSE  --  128*  --   < > 88 102* 97  < > = values in this interval not displayed.  CBG (last 3)   Recent Labs  08/08/14 0744 08/08/14 1113 08/08/14 1507  GLUCAP 98 110* 94    Scheduled Meds: . aspirin EC  325 mg Oral Daily   Or  . aspirin  324 mg Per Tube Daily  . atorvastatin  80 mg Oral q1800  . bisacodyl  10 mg Oral Daily   Or  . bisacodyl  10 mg Rectal Daily  . digoxin  0.125 mg Oral Daily  . docusate sodium  200 mg Oral Daily  . enoxaparin (LOVENOX) injection  40 mg Subcutaneous QHS  . feeding supplement (RESOURCE BREEZE)  1  Container Oral TID BM  . midodrine  10 mg Oral TID WC  . pantoprazole  40 mg Oral Daily  . sodium chloride  250 mL Intravenous Once  . sodium chloride  3 mL Intravenous Q12H    Continuous Infusions: . sodium chloride 250 mL (08/03/14 1345)  . DOBUTamine 2.5 mcg/kg/min (08/09/14 1500)  . DOPamine 5 mcg/kg/min (08/09/14 1500)  . insulin (NOVOLIN-R) infusion Stopped (08/03/14 1800)  . lactated ringers 20 mL/hr at 08/08/14 2000    Past Medical History  Diagnosis Date  . Psoriasis   . Hearing loss   . Paronychia   . Vitiligo   . PUD (peptic ulcer disease)   . Leg fracture, left   . Paronychia   . Tobacco abuse     Past Surgical History  Procedure Laterality Date  . Hernia repair    . Coronary artery bypass graft N/A 08/03/2014    Procedure: CORONARY ARTERY BYPASS GRAFTING (CABG);  Surgeon: [name], MD;  Location: [facility];  Service: Open Heart Surgery;  Laterality: N/A;  . Tee without cardioversion N/A 08/03/2014    Procedure: TRANSESOPHAGEAL ECHOCARDIOGRAM (TEE);  Surgeon:  [name], MD;  Location: [facility];  Service: Open Heart Surgery;  Laterality: N/A;    Carl [name], RD, LDN Pager #: [phone] After-Hours Pager #: [phone]

## 2014-08-09 NOTE — Consult Note (Addendum)
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 Troponin was 24 and EKG showed anteroseptal  MI with lat presentation.  Per Dr Cyndia Bent he had [place] cath at [facility] that showed severe 3 vessel CAD and EF 15%. He was transferred to [facility] for further evaluation. Dr Cyndia Bent was consulted and on 08/03/14 he had CABG x 5.   Due to ongoing inability to wean pressors and concerned for low output  the Heart Failure Team was requested. All drips weaned off except Neo and Dopamine. He has remained hypotensive. Yesterday midodrine was added. Not currently on diuretics. Last dose of lasix was on 08/07/14.   Complaining of dizziness. Had difficulty working with PT this am due to fatigue. Poor po intake.   BMET and CO-OX pending.   Test  ECHO 08/08/14 EF 15-20% Grade II DD.     SOCIAL HISTORY: He quit smoking a few months ago. He used to smoke 1 pack per day for at least 30 or 40 years. He denies any alcohol or recreational drug use. He lives at home with his mother.   FAMILY HISTORY: Remarkable for premature coronary artery disease. His father died of complications of myocardial infarction in his 36s. His brother had multiple cardiac stents   Review of Systems: [y] = yes, [ ]  = no   General: Weight gain [ ] ; Weight loss [Y ]; Anorexia [ ] ; Fatigue [ Y  ]; Fever [ ] ; Chills [ ] ; Weakness [ ]   Cardiac: Chest pain/pressure [ ] ; Resting SOB [ ] ; Exertional SOB [ ] ; Orthopnea [ ] ; Pedal Edema [ ] ; Palpitations [ ] ; Syncope [ ] ; Presyncope [ ] ; Paroxysmal nocturnal dyspnea[ ]   Pulmonary: Cough [ ] ; Wheezing[ ] ; Hemoptysis[ ] ; Sputum [ ] ; Snoring [ ]   GI: Vomiting[ ] ; Dysphagia[ ] ; Melena[ ] ; Hematochezia [ ] ; Heartburn[ ] ; Abdominal pain [  ]; Constipation [ ] ; Diarrhea [ ] ; BRBPR [ ]   GU: Hematuria[ ] ; Dysuria [ ] ; Nocturia[ ]   Vascular: Pain in legs with walking [ ] ; Pain in feet with lying flat [ ] ; Non-healing sores [ ] ; Stroke [ ] ; TIA [ ] ; Slurred speech [ ] ;  Neuro: Headaches[ ] ; Vertigo[ ] ; Seizures[ ] ; Paresthesias[ ] ;Blurred vision [ ] ; Diplopia [ ] ; Vision changes [ ]   Ortho/Skin: Arthritis [ ] ; Joint pain [ ] ; Muscle pain [ ] ; Joint swelling [ ] ; Back Pain [ ] ; Rash [ ]   Psych: Depression[ ] ; Anxiety[ ]   Heme: Bleeding problems [ ] ; Clotting disorders [ ] ; Anemia [ ]   Endocrine: Diabetes [ ] ; Thyroid dysfunction[ ]   Home Medications Prior to Admission medications   Medication Sig Start Date End Date Taking? Authorizing Provider  furosemide (LASIX) 40 MG tablet Take 40 mg by mouth daily. 07/25/14 07/25/15 Yes Historical Provider, MD  potassium chloride (K-DUR,KLOR-CON) 10 MEQ tablet Take 10 mEq by mouth daily. 07/25/14 07/25/15 Yes Historical Provider, MD  levofloxacin (LEVAQUIN) 500 MG tablet Take 500 mg by mouth daily. 07/14/14   Historical Provider, MD    Past Medical History: Past Medical History  Diagnosis Date  . Psoriasis   . Hearing loss   . Paronychia   . Vitiligo   . PUD (peptic ulcer disease)   . Leg fracture,  left   . Paronychia   . Tobacco abuse     Past Surgical History: Past Surgical History  Procedure Laterality Date  . Hernia repair    . Coronary artery bypass graft N/A 08/03/2014    Procedure: CORONARY ARTERY BYPASS GRAFTING (CABG);  Surgeon: [name], MD;  Location: [place];  Service: Open Heart Surgery;  Laterality: N/A;  . Tee without cardioversion N/A 08/03/2014    Procedure: TRANSESOPHAGEAL ECHOCARDIOGRAM (TEE);  Surgeon: [name], MD;  Location: [place];  Service: Open Heart Surgery;  Laterality: N/A;    Family History: History reviewed. No pertinent family history.  Social History: History   Social History  . Marital Status: Legally Separated    Spouse Name: N/A     Number of Children: N/A  . Years of Education: N/A   Social History Main Topics  . Smoking status: Current Every Day Smoker  . Smokeless tobacco: None  . Alcohol Use: No  . Drug Use: None  . Sexual Activity: None   Other Topics Concern  . None   Social History Narrative    Allergies:  No Known Allergies  Objective:    Vital Signs:   Temp:  [97.8 F (36.6 C)-98.4 F (36.9 C)] 98.4 F (36.9 C) (12/16 0717) Pulse Rate:  [36-111] 97 (12/16 0830) Resp:  [10-25] 15 (12/16 0830) BP: (56-116)/(27-82) 94/57 mmHg (12/16 0830) SpO2:  [80 %-100 %] 94 % (12/16 0830) Weight:  [145 lb 15.1 oz (66.2 kg)] 145 lb 15.1 oz (66.2 kg) (12/16 0500) Last BM Date: 08/03/14  Weight change: Filed Weights   08/07/14 0600 08/08/14 0404 08/09/14 0500  Weight: 147 lb 0.8 oz (66.7 kg) 156 lb 8.4 oz (71 kg) 145 lb 15.1 oz (66.2 kg)    Intake/Output:   Intake/Output Summary (Last 24 hours) at 08/09/14 0956 Last data filed at 08/09/14 0800  Gross per 24 hour  Intake  874.2 ml  Output    955 ml  Net  -80.8 ml     Physical Exam: General:  Elderly. No resp difficulty. Sitting in chair. Brother at bedside.  HEENT: normal Neck: supple. JVP flat . Carotids 2+ bilat; no bruits. No lymphadenopathy or thryomegaly appreciated. Cor: PMI nondisplaced. Regular rate & rhythm. No rubs, gallops or murmurs.mid sternal incision approximated Lungs: clear Abdomen: soft, nontender, nondistended. No hepatosplenomegaly. No bruits or masses. Good bowel sounds. Extremities: no cyanosis, clubbing, rash, edema Neuro: alert & orientedx3, cranial nerves grossly intact. moves all 4 extremities w/o difficulty. Affect pleasant  Telemetry: SR with PVCs 90s   Labs: Basic Metabolic Panel:  Recent Labs Lab 08/03/14 1930 08/04/14 0413 08/04/14 1600  08/05/14 0400 08/05/14 1820 08/06/14 0330 08/07/14 0223 08/09/14 1015  NA  --  134*  --   < > 135* 135* 134* 132* 136*  K  --  4.9  --   < > 4.9 4.4 4.4 4.1 4.6   CL  --  99  --   < > 99 96 97 94* 96  CO2  --  25  --   --  25  --  27 29 27   GLUCOSE  --  128*  --   < > 122* 97 88 102* 97  BUN  --  15  --   < > 13 15 15 14 17   CREATININE 0.70 0.73 0.78  < > 0.72 0.80 0.66 0.80 0.81  CALCIUM  --  8.7  --   --  8.6  --  8.6  8.9 9.0  MG 2.9* 2.2 1.9  --   --   --   --   --   --   < > = values in this interval not displayed.  Liver Function Tests:  Recent Labs Lab 08/06/14 0330  AST 42*  ALT 67*  ALKPHOS 56  BILITOT 0.7  PROT 5.4*  ALBUMIN 2.7*   No results for input(s): LIPASE, AMYLASE in the last 168 hours. No results for input(s): AMMONIA in the last 168 hours.  CBC:  Recent Labs Lab 08/04/14 0413 08/04/14 1600 08/04/14 1654 08/05/14 0400 08/05/14 1820 08/06/14 0330 08/07/14 0223  WBC 6.1 6.2  --  6.3  --  5.8 5.8  HGB 9.7* 9.5* 9.9* 9.0* 10.9* 9.2* 9.6*  HCT 29.2* 28.5* 29.0* 27.1* 32.0* 27.5* 29.2*  MCV 93.9 93.8  --  94.4  --  96.5 93.6  PLT 146* 140*  --  148*  --  179 242    Cardiac Enzymes: No results for input(s): CKTOTAL, CKMB, CKMBINDEX, TROPONINI in the last 168 hours.  BNP: BNP (last 3 results) No results for input(s): PROBNP in the last 8760 hours.  CBG:  Recent Labs Lab 08/07/14 1314 08/07/14 1531 08/08/14 0744 08/08/14 1113 08/08/14 1507  GLUCAP 95 99 98 110* 94    Coagulation Studies: No results for input(s): LABPROT, INR in the last 72 hours.  Other results:  Imaging:  No results found.   Medications:     Current Medications: . sodium chloride   Intravenous Once  . aspirin EC  325 mg Oral Daily   Or  . aspirin  324 mg Per Tube Daily  . atorvastatin  80 mg Oral q1800  . bisacodyl  10 mg Oral Daily   Or  . bisacodyl  10 mg Rectal Daily  . digoxin  0.125 mg Oral Daily  . docusate sodium  200 mg Oral Daily  . enoxaparin (LOVENOX) injection  40 mg Subcutaneous QHS  . feeding supplement (RESOURCE BREEZE)  1 Container Oral TID BM  . midodrine  10 mg Oral TID WC  . pantoprazole  40  mg Oral Daily  . sodium chloride  3 mL Intravenous Q12H     Infusions: . sodium chloride 250 mL (08/03/14 1345)  . DOPamine 5 mcg/kg/min (08/08/14 2300)  . insulin (NOVOLIN-R) infusion Stopped (08/03/14 1800)  . lactated ringers 20 mL/hr at 08/08/14 2000  . phenylephrine (NEO-SYNEPHRINE) Adult infusion 3.5 mcg/min (08/09/14 0600)      Assessment:  1. CAD---> S/P CABG x5  Left Internal mammary graft to LAD, SVG to diagonal, sequential SVG to OM2 and OM3, and SVG to PDA.   2. ICM Systolic Heart Failure EF 15-20% noted on ECHO 08/08/14  3. Hypotension 4. Tobacco Abuse 5. Mild Mental Retardation    Plan/Discussion:    Carl [name] is a 63 year old admitted with MI and is S/P CABG x5 . Due to inability to wean pressors and concern for low output the HF Team was consulted. He remains on Neo 3.5 mcg and Dopamine 5 mcg due to hypotension. Midodrine added with little effect. On exam he does not appear volume overloaded and may be dry or could be low output. Hemoglobin has been stable.   Check CVP and CO-OX. Check BMET now.  If low output will stop dopamine and neo and start milrinone with possible levo. May need to give fluid if CVP low.  Hopefully can add HF meds as pressure improves.   Continue aspirin and  lipitor. No BB yet.   CO-OX 63%.  CVP 2. Will need to give IV fluids.  Give 500 NS.   Length of Stay: 13  CLEGG,AMY NP-C  08/09/2014, 9:56 AM  Advanced Heart Failure Team Pager [phone] (M-F; 7a - 4p)  Please contact [facility] for night-coverage after hours (4p -7a ) and weekends on [email or web address]  Patient seen with NP, agree with the above note.  He has remained hypotensive.  Co-ox is ok at 63% but CVP quite low at 2.   - Will give 500 cc NS today.  - I am going to try to stop phenylephrine today and cut back dopamine to 2.5. I will add dobutamine 2.5 for inotropy (dobutamine rather than milrinone given hypotension).  Would tolerate SBP >/= 80.  He is not lightheaded with BP  in 80s and creatinine is stable.   - Continue digoxin and will continue midodrine for now but hopefully can titrate off.  - ASA and atorvastatin for CAD.   [name] 08/09/2014 1:15 PM

## 2014-08-09 NOTE — Progress Notes (Signed)
CT surgery p.m. Rounds  Patient examined and record reviewed.Hemodynamics stable,labs satisfactory.Patient had stable day.Continue current care. VAN TRIGT III,Verlon Carcione 08/09/2014

## 2014-08-09 NOTE — Progress Notes (Signed)
Physical Therapy Treatment Patient Details Name: Carl [name] ON MRN: [id number] DOB: May 01, 1951 Today's Date: 08/09/2014    History of Present Illness Mr Herbers was admitted with 2-3 weeks of not feeling well.  On admission determined to have suffered a STEMI,  Pt now s/p CABG x5    PT Comments    Pt's BP low and then dropped lower to 66/33 with standing so further mobility terminated. Min A to transfer bed to chair, pt monitored by RN. PT will continue to follow.   Follow Up Recommendations  SNF     Equipment Recommendations  None recommended by PT    Recommendations for Other Services       Precautions / Restrictions Precautions Precautions: Fall;Sternal Precaution Comments: pt unable to state sternal precautions    Mobility  Bed Mobility Overal bed mobility: Needs Assistance Bed Mobility: Supine to Sit     Supine to sit: Min guard     General bed mobility comments: pt sat to EOB without physical assist but a bit unsteady once EOB, close guarding given  Transfers Overall transfer level: Needs assistance Equipment used: Rolling walker (2 wheeled) Transfers: Sit to/from [facility] Sit to Stand: Min assist Stand pivot transfers: Min assist       General transfer comment: min A due to pt's unsteadiness and pt with low BP with c/o mild dizziness so close SPT done instead of ambulation. Pt maintained standing x1 min before stating that he didn't feel well and sat down  Ambulation/Gait             General Gait Details: NT due to low BP  66/33 in standing   Stairs            Wheelchair Mobility    Modified Rankin (Stroke Patients Only)       Balance Overall balance assessment: Needs assistance Sitting-balance support: Feet supported Sitting balance-Leahy Scale: Fair     Standing balance support: Bilateral upper extremity supported Standing balance-Leahy Scale: Poor Standing balance comment: needed UE today due to dizziness                    Cognition Arousal/Alertness: Awake/alert Behavior During Therapy: WFL for tasks assessed/performed Overall Cognitive Status: Impaired/Different from baseline Area of Impairment: Following commands;Memory     Memory: Decreased short-term memory;Decreased recall of precautions Following Commands: Follows one step commands with increased time;Follows multi-step commands with increased time       General Comments: pt slow to process, did not seem to remember previous PT session, could tell me who is brother was but otherwise appeared confused, could state that he didn't feel well but could give me no more details    Exercises General Exercises - Lower Extremity Ankle Circles/Pumps: AROM;Both;20 reps;Seated Long Arc Quad: AROM;Both;10 reps;Seated Hip Flexion/Marching: AROM;Both;10 reps;Seated    General Comments        Pertinent Vitals/Pain Pain Assessment: No/denies pain    Home Living                      Prior Function            PT Goals (current goals can now be found in the care plan section) Acute Rehab PT Goals Patient Stated Goal: pt did not state. PT Goal Formulation: With patient Time For Goal Achievement: 08/19/14 Potential to Achieve Goals: Good Progress towards PT goals: Not progressing toward goals - comment (due to low BP today)    Frequency  Min 3X/week    PT Plan Current plan remains appropriate    Co-evaluation             End of Session Equipment Utilized During Treatment: Gait belt Activity Tolerance: Treatment limited secondary to medical complications (Comment) (low BP) Patient left: in chair;with call bell/phone within reach;with family/visitor present     Time: [id number] PT Time Calculation (min) (ACUTE ONLY): 19 min  Charges:  $Therapeutic Activity: 8-22 mins                    G Codes:     Leighton Roach, PT  Acute Rehab Services  [phone]  Leighton Roach 08/09/2014, 2:01 PM

## 2014-08-09 NOTE — Clinical Social Work Psychosocial (Signed)
Clinical Social Work Department BRIEF PSYCHOSOCIAL ASSESSMENT 08/09/2014  Patient:  Carl [name], Carl [name]     Account Number:  [id number]     Admit date:  07/27/2014  Clinical Social Worker:  Dian Queen  Date/Time:  08/08/2014 03:42 PM  Referred by:  Physician  Date Referred:  08/08/2014 Referred for  SNF Placement   Other Referral:   Interview type:  Patient Other interview type:   Brother    PSYCHOSOCIAL DATA Living Status:  FAMILY Admitted from facility:   Level of care:   Primary support name:  Carl [name] Primary support relationship to patient:  FAMILY Degree of support available:   Patient's brother lives close by, but patient lives with his mom and helps take care of her.    CURRENT CONCERNS Current Concerns  Post-Acute Placement   Other Concerns:    SOCIAL WORK ASSESSMENT / PLAN Patent is a 63 year old male who lives with is mother. Patient has an elderly mom who has some health issues which he stays with her. Patient is alert and oriented x3. Patient was talkative and friendly, patient said he is from [place], and would like to go to [place], because it is close to where he lives.  Patient requested CSW to talk to his brother Carl [name] who lives about 3 blocks away and will be the main contact for patient.  Discussed SNF placement with patient and his brother, patient and family are agreeable to SNF placement for short term rehab once patient is medically ready and discharge orders have been given.   Assessment/plan status:  Psychosocial Support/Ongoing Assessment of Needs Other assessment/ plan:   Information/referral to community resources:    PATIENT'S/FAMILY'S RESPONSE TO PLAN OF CARE: Patient and family are agreeable to short term rehab placement and would like to go to [facility] if there is a bed available and he is medically ready for discharge.    Carl [name]. Carl [name], MSW, [place] 08/09/2014 3:58 PM

## 2014-08-09 NOTE — Progress Notes (Signed)
6 Days Post-Op Procedure(s) (LRB): CORONARY ARTERY BYPASS GRAFTING (CABG) (N/A) TRANSESOPHAGEAL ECHOCARDIOGRAM (TEE) (N/A) Subjective:  No complaints  Objective: Vital signs in last 24 hours: Temp:  [97.8 F (36.6 C)-98.4 F (36.9 C)] 98.4 F (36.9 C) (12/16 0717) Pulse Rate:  [36-111] 97 (12/16 0830) Cardiac Rhythm:  [-] Normal sinus rhythm;Sinus tachycardia (12/16 0800) Resp:  [10-25] 15 (12/16 0830) BP: (56-116)/(27-82) 94/57 mmHg (12/16 0830) SpO2:  [80 %-100 %] 94 % (12/16 0830) Weight:  [66.2 kg (145 lb 15.1 oz)] 66.2 kg (145 lb 15.1 oz) (12/16 0500)  Hemodynamic parameters for last 24 hours:    Intake/Output from previous day: 12/15 0701 - 12/16 0700 In: 1263.2 [P.O.:600; I.V.:663.2] Out: 1105 [Urine:1105] Intake/Output this shift: Total I/O In: 27.5 [I.V.:27.5] Out: -   General appearance: alert and cooperative Neurologic: intact Heart: regular rate and rhythm, S1, S2 normal, no murmur, click, rub or gallop Lungs: diminished breath sounds bibasilar Abdomen: soft, non-tender; bowel sounds normal; no masses,  no organomegaly Extremities: extremities normal, atraumatic, no cyanosis or edema Wound: incisions ok  Lab Results:  Recent Labs  08/07/14 0223  WBC 5.8  HGB 9.6*  HCT 29.2*  PLT 242   BMET:  Recent Labs  08/07/14 0223  NA 132*  K 4.1  CL 94*  CO2 29  GLUCOSE 102*  BUN 14  CREATININE 0.80  CALCIUM 8.9    PT/INR: No results for input(s): LABPROT, INR in the last 72 hours. ABG    Component Value Date/Time   PHART 7.329* 08/03/2014 1905   HCO3 26.0* 08/03/2014 1905   TCO2 25 08/05/2014 1820   ACIDBASEDEF 3.0* 08/03/2014 1754   O2SAT 63.5 08/07/2014 0350   CBG (last 3)   Recent Labs  08/08/14 0744 08/08/14 1113 08/08/14 1507  GLUCAP 98 110* [place], [place] [address]               [phone]  ------------------------------------------------------------------- Transthoracic Echocardiography  Patient:  Carl, [name] MR #:    [id number] Study Date: 08/08/2014 Gender:   M Age:    63 Height:   182.9 cm Weight:   70.8 kg BSA:    1.89 m^2 Pt. Status: Room:    2S15C  ORDERING   Gilford Raid, MD REFERRING  Gilford Raid, MD SONOGRAPHER [facility] ADMITTING  Ena Dawley, M.D. ATTENDING  Ena Dawley, M.D. PERFORMING  [facility], Inpatient  cc:  ------------------------------------------------------------------- LV EF: 15%  ------------------------------------------------------------------- Indications:   Cardiomyopathy - ischemic 414.8.  ------------------------------------------------------------------- History:  PMH: Former Smoker, NSTEMI, Cardiogenic Shock, Acute Systolic Heart Failure, AKI.  ------------------------------------------------------------------- Study Conclusions  - Left ventricle: The cavity size was normal. Wall thickness was normal. The estimated ejection fraction was 15-20%. Diffuse hypokinesis. There is akinesis of the anteroseptal and inferior myocardium. Features are consistent with a pseudonormal left ventricular filling pattern, with concomitant abnormal relaxation and increased filling pressure (grade 2 diastolic dysfunction). Doppler parameters are consistent with high ventricular filling pressure. - Mitral valve: There was mild regurgitation. - Left atrium: The atrium was mildly dilated. - Right ventricle: Systolic function was mildly reduced. - Right atrium: The atrium was mildly dilated. - Pericardium, extracardiac: There was a left pleural effusion.  Impressions:  -  Akinesis of the inferior and anteroseptal wall; overall severely reduced LV function; grade 2 diastolic dysfunction with elevated LV filling pressures; mild biatrial  enlargement; mild MR; mildly reduced RV function. Compared to 07/31/14, LV function remains severely reduced and MR now appears to be mild.  ------------------------------------------------------------------- Labs, prior tests, procedures, and surgery: Coronary artery bypass grafting.  Transthoracic echocardiography. M-mode, complete 2D, spectral Doppler, and color Doppler. Birthdate: Patient birthdate: 1951-07-20. Age: Patient is 63 yr old. Sex: Gender: male. BMI: 21.2 kg/m^2. Blood pressure:   84/52 Patient status: Inpatient. Study date: Study date: 08/08/2014. Study time: 03:50 PM. Location: Bedside.  -------------------------------------------------------------------  ------------------------------------------------------------------- Left ventricle: The cavity size was normal. Wall thickness was normal. The estimated ejection fraction was 15-20%. Diffuse hypokinesis. Regional wall motion abnormalities:  There is akinesis of the anteroseptal and inferior myocardium. Features are consistent with a pseudonormal left ventricular filling pattern, with concomitant abnormal relaxation and increased filling pressure (grade 2 diastolic dysfunction). Doppler parameters are consistent with high ventricular filling pressure.  ------------------------------------------------------------------- Aortic valve:  Trileaflet; mildly thickened leaflets. Mobility was not restricted. Doppler: Transvalvular velocity was within the normal range. There was no stenosis. There was no regurgitation.  ------------------------------------------------------------------- Aorta: Aortic root: The aortic root was normal in size.  ------------------------------------------------------------------- Mitral valve:  Structurally normal valve.  Mobility was not restricted. Doppler: Transvalvular velocity was within the normal range. There was no evidence for stenosis. There was  mild regurgitation.  Peak gradient (D): 3 mm Hg.  ------------------------------------------------------------------- Left atrium: The atrium was mildly dilated.  ------------------------------------------------------------------- Right ventricle: The cavity size was normal. Systolic function was mildly reduced.  ------------------------------------------------------------------- Pulmonic valve:  Doppler: Transvalvular velocity was within the normal range. There was no evidence for stenosis. There was trivial regurgitation.  ------------------------------------------------------------------- Tricuspid valve:  Structurally normal valve.  Doppler: Transvalvular velocity was within the normal range. There was trivial regurgitation.  ------------------------------------------------------------------- Pulmonary artery:  Systolic pressure was within the normal range.  ------------------------------------------------------------------- Right atrium: The atrium was mildly dilated.  ------------------------------------------------------------------- Pericardium: There was no pericardial effusion.  ------------------------------------------------------------------- Pleura: There was a left pleural effusion.  ------------------------------------------------------------------- Measurements  Left ventricle               Value    Reference LV ID, ED, PLAX chordal       (H)   52.2 mm   43 - 52 LV ID, ES, PLAX chordal       (H)   47.3 mm   23 - 38 LV fx shortening, PLAX chordal   (L)   9   %   >=29 LV PW thickness, ED             11.4 mm   --------- IVS/LV PW ratio, ED             0.79     <=1.3 LV e&', lateral               5.98 cm/s  --------- LV E/e&', lateral              13.48    --------- LV e&', medial                4.68 cm/s   --------- LV E/e&', medial               17.22    --------- LV e&', average               5.33 cm/s  --------- LV E/e&', average  15.12    ---------  Ventricular septum             Value    Reference IVS thickness, ED              9.04 mm   ---------  Aorta                    Value    Reference Aortic root ID, ED             34  mm   ---------  Left atrium                 Value    Reference LA ID, A-P, ES               37  mm   --------- LA ID/bsa, A-P               1.96 cm/m^2 <=2.2 LA volume, S                78.4 ml   --------- LA volume/bsa, S              41.5 ml/m^2 --------- LA volume, ES, 1-p A4C           60.3 ml   --------- LA volume/bsa, ES, 1-p A4C         31.9 ml/m^2 --------- LA volume, ES, 1-p A2C           103  ml   --------- LA volume/bsa, ES, 1-p A2C         54.5 ml/m^2 ---------  Mitral valve                Value    Reference Mitral E-wave peak velocity         80.6 cm/s  --------- Mitral A-wave peak velocity         52.3 cm/s  --------- Mitral deceleration time          158  ms   150 - 230 Mitral peak gradient, D           3   mm Hg --------- Mitral E/A ratio, peak           1.5     --------- Mitral maximal regurg velocity,       476  cm/s  --------- PISA Mitral regurg VTI, PISA           127  cm   ---------  Tricuspid valve               Value    Reference Tricuspid regurg peak velocity       237  cm/s  --------- Tricuspid peak RV-RA gradient        22  mm Hg ---------  Legend: (L) and (H) mark values outside  specified reference range.  ------------------------------------------------------------------- Prepared and Electronically Authenticated by  Kirk Ruths 2015-12-15T17:35:23   Assessment/Plan: S/P Procedure(s) (LRB): CORONARY ARTERY BYPASS GRAFTING (CABG) (N/A) TRANSESOPHAGEAL ECHOCARDIOGRAM (TEE) (N/A)  Ischemic cardiomyopathy with EF 15-20% by echo yesterday. It remains unchanged after revascularization. His heart was thinned out with diffuse scar in the OR so I am not surprised.   He remains on dopamine and neo to keep BP up in the 90's and have not been able to wean the neo off despite starting midodrine. He has had normal creatinine and adequate urine output. Will check a Co-ox this am.   I have consulted the Advanced  Heart Failure Team to see if they have any other suggestions. I am not sure if his low BP is due to low cardiac output or vasomotor problem. His CI was 3.0 postop  On dop 5 and milrinone 0.3 by he was also requiring high dose neo to deep BP up.    LOS: 13 days    BARTLE,BRYAN K 08/09/2014

## 2014-08-09 NOTE — Progress Notes (Addendum)
CT sur

## 2014-08-10 LAB — BASIC METABOLIC PANEL
ANION GAP: 12 (ref 5–15)
BUN: 14 mg/dL (ref 6–23)
CHLORIDE: 98 meq/L (ref 96–112)
CO2: 26 mEq/L (ref 19–32)
Calcium: 8.5 mg/dL (ref 8.4–10.5)
Creatinine, Ser: 0.81 mg/dL (ref 0.50–1.35)
GFR calc non Af Amer: >90 mL/min (ref >90)
Glucose, Bld: 89 mg/dL (ref 70–99)
POTASSIUM: 4.1 meq/L (ref 3.7–5.3)
Sodium: 136 mEq/L — ABNORMAL LOW (ref 137–147)

## 2014-08-10 LAB — GLUCOSE, CAPILLARY: Glucose-Capillary: 90 mg/dL (ref 70–99)

## 2014-08-10 LAB — CARBOXYHEMOGLOBIN
Carboxyhemoglobin: 1.7 % — ABNORMAL HIGH (ref 0.5–1.5)
Methemoglobin: 0.7 % (ref 0.0–1.5)
O2 SAT: 57.6 %
Total hemoglobin: 9.4 g/dL — ABNORMAL LOW (ref 13.5–18.0)

## 2014-08-10 MED ORDER — LACTATED RINGERS IV SOLN
INTRAVENOUS | Status: DC
  Administered 2014-08-10: 12:00:00 via INTRAVENOUS

## 2014-08-10 MED ORDER — SODIUM CHLORIDE 0.9 % IV BOLUS (SEPSIS)
500.0000 mL | Freq: Once | INTRAVENOUS | Status: AC
  Administered 2014-08-10: 500 mL via INTRAVENOUS

## 2014-08-10 NOTE — Progress Notes (Signed)
Patient will not eat, pt encouraged to take supplements. Will continue to monitor.

## 2014-08-10 NOTE — Progress Notes (Signed)
Patient ID: Carl [name], male   DOB: 04-Sep-1950, 63 y.o.   MRN: [id number]  SICU Evening Rounds  BP 97/64 on dobut 4 mcg.   He ambulated today  Sitting up in chair, alert, eating a chicken sandwich that his mother brought him.   Positive 1080 cc today.

## 2014-08-10 NOTE — Progress Notes (Addendum)
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 Troponin was 24 and EKG showed anteroseptal MI with lat presentation. Per Dr Cyndia Bent he had [place] cath at [facility] that showed severe 3 vessel CAD and EF 15%. He was transferred to [facility] for further evaluation. Dr Cyndia Bent was consulted and on 08/03/14 he had CABG x 5.   Due to ongoing inability to wean pressors and concerned for low output the Heart Failure Team was requested. All drips weaned off except Neo and Dopamine. He has remained hypotensive. On midodrine was added. Not currently on diuretics. Last dose of lasix was on 08/07/14.   Yesterday neo stopped . Volume status low with CVP 2. He was given NS bolus 500 cc x2 . He was started on dobutamine at 2.5 mcg and he continued on dopamine 2.5 mcg. BP remains soft   Mild dizziness this am. Denies SOB    Test  ECHO 08/08/14 EF 15-20% Grade II DD.     Objective:   Weight Range:  Vital Signs:   Temp:  [97.7 F (36.5 C)-98.3 F (36.8 C)] 97.8 F (36.6 C) (12/17 0708) Pulse Rate:  [66-132] 108 (12/17 0745) Resp:  [9-26] 21 (12/17 0745) BP: (69-118)/(44-86) 83/49 mmHg (12/17 0745) SpO2:  [90 %-100 %] 99 % (12/17 0745) Weight:  [144 lb 2.9 oz (65.4 kg)] 144 lb 2.9 oz (65.4 kg) (12/17 0500) Last BM Date: 08/10/14  Weight change: Filed Weights   08/08/14 0404 08/09/14 0500 08/10/14 0500  Weight: 156 lb 8.4 oz (71 kg) 145 lb 15.1 oz (66.2 kg) 144 lb 2.9 oz (65.4 kg)    Intake/Output:   Intake/Output Summary (Last 24 hours) at 08/10/14 0809 Last data filed at 08/10/14 0700  Gross per 24 hour  Intake 1122.99 ml  Output   1965 ml  Net -842.01 ml     Physical Exam: CVP 5 General: Elderly. No resp difficulty. Sitting in chair. HEENT: normal Neck: supple. JVP flat . Carotids 2+ bilat; no bruits. No  lymphadenopathy or thryomegaly appreciated. Cor: PMI nondisplaced. Regular rate & rhythm. No rubs, gallops or murmurs.mid sternal incision approximated Lungs: clear Abdomen: soft, nontender, nondistended. No hepatosplenomegaly. No bruits or masses. Good bowel sounds. Extremities: no cyanosis, clubbing, rash, edema Neuro: alert & orientedx3, cranial nerves grossly intact. moves all 4 extremities w/o difficulty. Affect pleasant  Telemetry: SR with PVCs 90s   Labs: Basic Metabolic Panel:  Recent Labs Lab 08/03/14 1930  08/04/14 0413 08/04/14 1600  08/05/14 0400 08/05/14 1820 08/06/14 0330 08/07/14 0223 08/09/14 1015 08/10/14 0400  NA  --   --  134*  --   < > 135* 135* 134* 132* 136* 136*  K  --   --  4.9  --   < > 4.9 4.4 4.4 4.1 4.6 4.1  CL  --   --  99  --   < > 99 96 97 94* 96 98  CO2  --   < > 25  --   --  25  --  27 29 27 26   GLUCOSE  --   --  128*  --   < > 122* 97 88 102* 97 89  BUN  --   --  15  --   < > 13 15 15 14 17 14   CREATININE 0.70  --  0.73  0.78  < > 0.72 0.80 0.66 0.80 0.81 0.81  CALCIUM  --   < > 8.7  --   --  8.6  --  8.6 8.9 9.0 8.5  MG 2.9*  --  2.2 1.9  --   --   --   --   --   --   --   < > = values in this interval not displayed.  Liver Function Tests:  Recent Labs Lab 08/06/14 0330 08/09/14 1015  AST 42* 49*  ALT 67* 65*  ALKPHOS 56 77  BILITOT 0.7 1.0  PROT 5.4* 6.0  ALBUMIN 2.7* 2.9*   No results for input(s): LIPASE, AMYLASE in the last 168 hours. No results for input(s): AMMONIA in the last 168 hours.  CBC:  Recent Labs Lab 08/04/14 1600  08/05/14 0400 08/05/14 1820 08/06/14 0330 08/07/14 0223 08/09/14 1015  WBC 6.2  --  6.3  --  5.8 5.8 6.4  HGB 9.5*  < > 9.0* 10.9* 9.2* 9.6* 10.1*  HCT 28.5*  < > 27.1* 32.0* 27.5* 29.2* 30.6*  MCV 93.8  --  94.4  --  96.5 93.6 94.4  PLT 140*  --  148*  --  179 242 364  < > = values in this interval not displayed.  Cardiac Enzymes: No results for input(s): CKTOTAL, CKMB, CKMBINDEX,  TROPONINI in the last 168 hours.  BNP: BNP (last 3 results) No results for input(s): PROBNP in the last 8760 hours.   Other results:  EKG:   Imaging: Dg Chest Port 1 View  08/09/2014   CLINICAL DATA:  Status post CABG surgery.  Postop day 6.  EXAM: PORTABLE CHEST - 1 VIEW  COMPARISON:  08/07/2014  FINDINGS: Vascular congestion is mildly improved. Hazy opacity at the lung bases has also improved consistent with a decrease in small effusions and associated atelectasis.  There are no new areas of lung opacity.  No pneumothorax.  No mediastinal widening. Cardiac silhouette is normal in size and configuration.  Left subclavian central venous line is stable, and well-positioned.  IMPRESSION: Improved lung aeration since the prior study consistent with improved vascular congestion. Decreased pleural effusions.   Electronically Signed   By: Lajean Manes M.D.   On: 08/09/2014 10:18      Medications:     Scheduled Medications: . aspirin EC  325 mg Oral Daily   Or  . aspirin  324 mg Per Tube Daily  . atorvastatin  80 mg Oral q1800  . bisacodyl  10 mg Oral Daily   Or  . bisacodyl  10 mg Rectal Daily  . digoxin  0.125 mg Oral Daily  . docusate sodium  200 mg Oral Daily  . enoxaparin (LOVENOX) injection  40 mg Subcutaneous QHS  . feeding supplement (ENSURE COMPLETE)  237 mL Oral Q1500  . feeding supplement (RESOURCE BREEZE)  1 Container Oral BID  . midodrine  10 mg Oral TID WC  . pantoprazole  40 mg Oral Daily  . sodium chloride  250 mL Intravenous Once  . sodium chloride  3 mL Intravenous Q12H     Infusions: . sodium chloride 250 mL (08/03/14 1345)  . DOBUTamine 2.5 mcg/kg/min (08/10/14 0600)  . DOPamine 2.5 mcg/kg/min (08/10/14 0600)  . insulin (NOVOLIN-R) infusion Stopped (08/03/14 1800)  . lactated ringers 20 mL/hr at 08/08/14 2000     PRN Medications:  ondansetron (ZOFRAN) IV, oxyCODONE, sodium chloride, traMADol   Assessment:  1. CAD---> S/P CABG x5 Left Internal  mammary  graft to LAD, SVG to diagonal, sequential SVG to OM2 and OM3, and SVG to PDA.  2. ICM Systolic Heart Failure EF 15-20% noted on ECHO 08/08/14  3. Hypotension 4. Tobacco Abuse 5. Mild Mental Retardation    Plan/Discussion:   CVP a little better from 2>5. BP remains soft.  Check CO-OX. Renal function ok. Continue dobutamine 2.5 mcg and dopamine 2.5 mcg. Keep off diuretics.   Give 500 ss NS bolus now.   Length of Stay: 14   CLEGG,AMY NP-C 08/10/2014, 8:09 AM  Advanced Heart Failure Team Pager [phone] (M-F; [place])  Please contact [facility] for night-coverage after hours (4p -7a ) and weekends on [email or web address]  Patient seen with NP, agree with the above note.  SBP in 90s currently with CVP 5 and co-ox 58%.  I will increase dobutamine to 4 and stop dopamine.  He will get another 500 cc bolus.   [name] 08/10/2014 8:44 AM

## 2014-08-10 NOTE — Progress Notes (Signed)
7 Days Post-Op Procedure(s) (LRB): CORONARY ARTERY BYPASS GRAFTING (CABG) (N/A) TRANSESOPHAGEAL ECHOCARDIOGRAM (TEE) (N/A) Subjective:  No complaints  Objective: Vital signs in last 24 hours: Temp:  [97.7 F (36.5 C)-98.3 F (36.8 C)] 98 F (36.7 C) (12/17 1108) Pulse Rate:  [66-132] 89 (12/17 1315) Cardiac Rhythm:  [-] Normal sinus rhythm;Sinus tachycardia (12/17 0716) Resp:  [8-27] 27 (12/17 1315) BP: (65-122)/(35-89) 119/62 mmHg (12/17 1315) SpO2:  [91 %-100 %] 99 % (12/17 1315) Weight:  [65.4 kg (144 lb 2.9 oz)] 65.4 kg (144 lb 2.9 oz) (12/17 0500)  Hemodynamic parameters for last 24 hours: CVP:  [0 mmHg-13 mmHg] 0 mmHg  Intake/Output from previous day: 12/16 0701 - 12/17 0700 In: 1150.5 [P.O.:350; I.V.:300.5; IV Piggyback:500] Out: 2015 [Urine:2015] Intake/Output this shift: Total I/O In: 948.4 [P.O.:240; I.V.:208.4; IV Piggyback:500] Out: 181 [Urine:180; Stool:1]  General appearance: alert and cooperative Neurologic: intact Heart: regular rate and rhythm, S1, S2 normal, no murmur, click, rub or gallop Lungs: diminished breath sounds bibasilar Abdomen: soft, non-tender; bowel sounds normal; no masses,  no organomegaly Extremities: extremities normal, atraumatic, no cyanosis or edema Wound: incisions ok  Lab Results:  Recent Labs  08/09/14 1015  WBC 6.4  HGB 10.1*  HCT 30.6*  PLT 364   BMET:  Recent Labs  08/09/14 1015 08/10/14 0400  NA 136* 136*  K 4.6 4.1  CL 96 98  CO2 27 26  GLUCOSE 97 89  BUN 17 14  CREATININE 0.81 0.81  CALCIUM 9.0 8.5    PT/INR: No results for input(s): LABPROT, INR in the last 72 hours. ABG    Component Value Date/Time   PHART 7.329* 08/03/2014 1905   HCO3 26.0* 08/03/2014 1905   TCO2 25 08/05/2014 1820   ACIDBASEDEF 3.0* 08/03/2014 1754   O2SAT 57.6 08/10/2014 0806   CBG (last 3)   Recent Labs  08/08/14 0744 08/08/14 1113 08/08/14 1507  GLUCAP 98 110* 94   CXR yesterday afternoon showed improved aeration  and decreased pleural effusions.  Assessment/Plan: S/P Procedure(s) (LRB): CORONARY ARTERY BYPASS GRAFTING (CABG) (N/A) TRANSESOPHAGEAL ECHOCARDIOGRAM (TEE) (N/A)  He was started on dobutamine 2.5 yesterday and neo and dopamine have been weaned off. His BP is now 120/64 on dobutamine 4. -807 cc for 24 hrs and wt down 2 lbs. He is not eating or drinking much. It takes a lot of encouragement to get him to take anything po. He will eat it if the nurses stand there and make him eat which is time intensive. Will stop Lipitor in case that is affecting his appetite. Start some IVF to prevent dehydration.  Continue IS and ambulation   LOS: 14 days    BARTLE,BRYAN K 08/10/2014

## 2014-08-11 LAB — BASIC METABOLIC PANEL
ANION GAP: 11 (ref 5–15)
BUN: 11 mg/dL (ref 6–23)
CALCIUM: 8.5 mg/dL (ref 8.4–10.5)
CO2: 25 meq/L (ref 19–32)
Chloride: 99 mEq/L (ref 96–112)
Creatinine, Ser: 0.74 mg/dL (ref 0.50–1.35)
GFR calc Af Amer: >90 mL/min (ref >90)
Glucose, Bld: 95 mg/dL (ref 70–99)
Potassium: 3.9 mEq/L (ref 3.7–5.3)
Sodium: 135 mEq/L — ABNORMAL LOW (ref 137–147)

## 2014-08-11 LAB — CARBOXYHEMOGLOBIN
Carboxyhemoglobin: 1.5 % (ref 0.5–1.5)
Methemoglobin: 0.7 % (ref 0.0–1.5)
O2 SAT: 74.6 %
Total hemoglobin: 9.3 g/dL — ABNORMAL LOW (ref 13.5–18.0)

## 2014-08-11 LAB — DIGOXIN LEVEL: DIGOXIN LVL: 0.5 ng/mL — AB (ref 0.8–2.0)

## 2014-08-11 MED ORDER — ATORVASTATIN CALCIUM 40 MG PO TABS
40.0000 mg | ORAL_TABLET | Freq: Every day | ORAL | Status: DC
  Administered 2014-08-11 – 2014-08-16 (×6): 40 mg via ORAL
  Filled 2014-08-11 (×7): qty 1

## 2014-08-11 MED ORDER — DOBUTAMINE IN D5W 4-5 MG/ML-% IV SOLN
4.0000 ug/kg/min | INTRAVENOUS | Status: DC
  Filled 2014-08-11: qty 250

## 2014-08-11 MED ORDER — BISACODYL 10 MG RE SUPP: 10.0000 mg | Freq: Every day | RECTAL | Status: DC | PRN

## 2014-08-11 MED ORDER — MOVING RIGHT ALONG BOOK
Freq: Once | Status: AC
  Administered 2014-08-11: 20:00:00
  Filled 2014-08-11: qty 1

## 2014-08-11 MED ORDER — BISACODYL 5 MG PO TBEC: 10.0000 mg | DELAYED_RELEASE_TABLET | Freq: Every day | ORAL | Status: DC | PRN

## 2014-08-11 MED ORDER — PANTOPRAZOLE SODIUM 40 MG PO TBEC
40.0000 mg | DELAYED_RELEASE_TABLET | Freq: Every day | ORAL | Status: DC
  Administered 2014-08-12 – 2014-08-17 (×6): 40 mg via ORAL
  Filled 2014-08-11 (×6): qty 1

## 2014-08-11 MED ORDER — LACTATED RINGERS IV SOLN
INTRAVENOUS | Status: DC
  Administered 2014-08-11: 21:00:00 via INTRAVENOUS

## 2014-08-11 MED ORDER — DOCUSATE SODIUM 100 MG PO CAPS
200.0000 mg | ORAL_CAPSULE | Freq: Every day | ORAL | Status: DC
  Administered 2014-08-12 – 2014-08-17 (×6): 200 mg via ORAL
  Filled 2014-08-11 (×6): qty 2

## 2014-08-11 MED ORDER — SODIUM CHLORIDE 0.9 % IV BOLUS (SEPSIS): 250.0000 mL | Freq: Once | INTRAVENOUS | Status: AC

## 2014-08-11 MED ORDER — ONDANSETRON HCL 4 MG/2ML IJ SOLN: 4.0000 mg | Freq: Four times a day (QID) | INTRAMUSCULAR | Status: DC | PRN

## 2014-08-11 MED ORDER — SODIUM CHLORIDE 0.9 % IV SOLN
INTRAVENOUS | Status: AC
  Administered 2014-08-11: 13:00:00 via INTRAVENOUS

## 2014-08-11 MED ORDER — ONDANSETRON HCL 4 MG PO TABS: 4.0000 mg | ORAL_TABLET | Freq: Four times a day (QID) | ORAL | Status: DC | PRN

## 2014-08-11 MED ORDER — ASPIRIN EC 325 MG PO TBEC
325.0000 mg | DELAYED_RELEASE_TABLET | Freq: Every day | ORAL | Status: DC
  Administered 2014-08-12 – 2014-08-17 (×6): 325 mg via ORAL
  Filled 2014-08-11 (×6): qty 1

## 2014-08-11 NOTE — Clinical Social Work Note (Signed)
Contacted patient's brother Json Koelzer to discuss possible SNF placement once patient is medically ready to discharge.  Patient's brother [name] said patient has not been to rehab before.  Patient's brother said they would like him to go to [facility] if possible because patient lives very close to SNF.  As patient progresses, CSW will keep in contact with patient and his brother.  FL2 and 30 day passar note for SNF on patient's chart awaiting signature by physician.  Jones Broom. [place], MSW, [place] 08/11/2014 4:38 PM

## 2014-08-11 NOTE — Progress Notes (Signed)
Patient ID: Carl [name], male   DOB: 1951-08-01, 63 y.o.   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 Troponin was 24 and EKG showed anteroseptal MI with lat presentation. Per Dr Cyndia Bent he had [place] cath at [facility] that showed severe 3 vessel CAD and EF 15%. He was transferred to [facility] for further evaluation. Dr Cyndia Bent was consulted and on 08/03/14 he had CABG x 5.   Due to ongoing inability to wean pressors and concerned for low output the Heart Failure Team was requested. All drips weaned off except Neo and Dopamine. He has remained hypotensive. On midodrine was added. Not currently on diuretics. Last dose of lasix was on 08/07/14.   Patient is now off dopamine and on dobutamine at 4.  Co-ox 75%.  SBP 90s-100s. CVP 3 today.   No dyspnea or chest pain.  Test  ECHO 08/08/14 EF 15-20% Grade II DD.     Objective:   Weight Range:  Vital Signs:   Temp:  [97.5 F (36.4 C)-98.7 F (37.1 C)] 97.8 F (36.6 C) (12/18 1131) Pulse Rate:  [72-120] 95 (12/18 1100) Resp:  [12-29] 25 (12/18 1100) BP: (75-122)/(37-84) 97/64 mmHg (12/18 1100) SpO2:  [94 %-100 %] 96 % (12/18 1100) Weight:  [145 lb 1 oz (65.8 kg)] 145 lb 1 oz (65.8 kg) (12/18 0500) Last BM Date: 08/10/14  Weight change: Filed Weights   08/09/14 0500 08/10/14 0500 08/11/14 0500  Weight: 145 lb 15.1 oz (66.2 kg) 144 lb 2.9 oz (65.4 kg) 145 lb 1 oz (65.8 kg)    Intake/Output:   Intake/Output Summary (Last 24 hours) at 08/11/14 1223 Last data filed at 08/11/14 1100  Gross per 24 hour  Intake   2022 ml  Output   1281 ml  Net    741 ml     Physical Exam: CVP 5 General: Elderly. No resp difficulty. Sitting in chair. HEENT: normal Neck: supple. JVP flat . Carotids 2+ bilat; no bruits. No lymphadenopathy or thryomegaly  appreciated. Cor: PMI nondisplaced. Regular rate & rhythm. No rubs, gallops or murmurs.mid sternal incision approximated Lungs: clear Abdomen: soft, nontender, nondistended. No hepatosplenomegaly. No bruits or masses. Good bowel sounds. Extremities: no cyanosis, clubbing, rash, edema Neuro: alert & orientedx3, cranial nerves grossly intact. moves all 4 extremities w/o difficulty. Affect pleasant  Telemetry: SR with PVCs 90s   Labs: Basic Metabolic Panel:  Recent Labs Lab 08/04/14 1600  08/06/14 0330 08/07/14 0223 08/09/14 1015 08/10/14 0400 08/11/14 0455  NA  --   < > 134* 132* 136* 136* 135*  K  --   < > 4.4 4.1 4.6 4.1 3.9  CL  --   < > 97 94* 96 98 99  CO2  --   < > 27 29 27 26 25   GLUCOSE  --   < > 88 102* 97 89 95  BUN  --   < > 15 14 17 14 11   CREATININE 0.78  < > 0.66 0.80 0.81 0.81 0.74  CALCIUM  --   < > 8.6 8.9 9.0 8.5 8.5  MG 1.9  --   --   --   --   --   --   < > = values in this interval not displayed.  Liver Function Tests:  Recent Labs Lab 08/06/14  0330 08/09/14 1015  AST 42* 49*  ALT 67* 65*  ALKPHOS 56 77  BILITOT 0.7 1.0  PROT 5.4* 6.0  ALBUMIN 2.7* 2.9*   No results for input(s): LIPASE, AMYLASE in the last 168 hours. No results for input(s): AMMONIA in the last 168 hours.  CBC:  Recent Labs Lab 08/04/14 1600  08/05/14 0400 08/05/14 1820 08/06/14 0330 08/07/14 0223 08/09/14 1015  WBC 6.2  --  6.3  --  5.8 5.8 6.4  HGB 9.5*  < > 9.0* 10.9* 9.2* 9.6* 10.1*  HCT 28.5*  < > 27.1* 32.0* 27.5* 29.2* 30.6*  MCV 93.8  --  94.4  --  96.5 93.6 94.4  PLT 140*  --  148*  --  179 242 364  < > = values in this interval not displayed.  Cardiac Enzymes: No results for input(s): CKTOTAL, CKMB, CKMBINDEX, TROPONINI in the last 168 hours.  BNP: BNP (last 3 results) No results for input(s): PROBNP in the last 8760 hours.   Other results:  EKG:   Imaging: No results found.   Medications:     Scheduled Medications: . aspirin EC  325 mg  Oral Daily   Or  . aspirin  324 mg Per Tube Daily  . bisacodyl  10 mg Oral Daily   Or  . bisacodyl  10 mg Rectal Daily  . digoxin  0.125 mg Oral Daily  . docusate sodium  200 mg Oral Daily  . enoxaparin (LOVENOX) injection  40 mg Subcutaneous QHS  . feeding supplement (ENSURE COMPLETE)  237 mL Oral Q1500  . feeding supplement (RESOURCE BREEZE)  1 Container Oral BID  . midodrine  10 mg Oral TID WC  . pantoprazole  40 mg Oral Daily  . sodium chloride  250 mL Intravenous Once  . sodium chloride  3 mL Intravenous Q12H    Infusions: . sodium chloride 250 mL (08/03/14 1345)  . DOBUTamine 4 mcg/kg/min (08/11/14 1000)  . insulin (NOVOLIN-R) infusion Stopped (08/03/14 1800)  . lactated ringers 50 mL/hr at 08/11/14 1000  . lactated ringers Stopped (08/11/14 1000)    PRN Medications: ondansetron (ZOFRAN) IV, oxyCODONE, sodium chloride, traMADol   Assessment:  1. CAD---> S/P CABG x5 Left Internal mammary graft to LAD, SVG to diagonal, sequential SVG to OM2 and OM3, and SVG to PDA.  2. ICM Systolic Heart Failure EF 15-20% noted on ECHO 08/08/14  3. Hypotension 4. Tobacco Abuse 5. Mild Mental Retardation    Plan/Discussion:   CVP 3. Good co-ox at 75%.  He is now on midodrine and digoxin as well.  Will decrease dobutamine to 2 today for slow wean.  Repeat co-ox in am and stop dobutamine if stable.  Will give 250 cc NS.  He will be on ASA and will also start him on statin.   Length of Stay: [place]  08/11/2014, 12:23 PM

## 2014-08-11 NOTE — Progress Notes (Signed)
Physical Therapy Treatment Patient Details Name: Carl [name] MRN: [id number] DOB: Apr 21, 1951 Today's Date: 08/11/2014    History of Present Illness Mr Zeitz was admitted with 2-3 weeks of not feeling well.  On admission determined to have suffered a STEMI,  Pt now s/p CABG x5    PT Comments    Patient with some complaints of pain on chest where tape pulls and also of discomfort from spine pushing into chair and bed.  Pt with min guard for all mobility except with min assist to correct LOB during ambulation.  He reports that he feels more off-balance still than normal.  No reports of dizziness or lightheadedness during today's session.  He prefers to wear his shoes when walking, feeling that they give him more support.  Will continue to follow to improve functional mobility and maximize independence.    Follow Up Recommendations  SNF     Equipment Recommendations       Recommendations for Other Services       Precautions / Restrictions Precautions Precautions: Fall;Sternal Precaution Comments: pt unable to state sternal precautions    Mobility  Bed Mobility               General bed mobility comments: pt in chair on arrival  Transfers Overall transfer level: Needs assistance   Transfers: Sit to/from Stand Sit to Stand: Min guard         General transfer comment: cues for hand placement, sequence, safety and precautions  Ambulation/Gait Ambulation/Gait assistance: Min guard Ambulation Distance (Feet): 600 Feet Assistive device: None Gait Pattern/deviations: Step-through pattern;Decreased stride length   Gait velocity interpretation: Below normal speed for age/gender General Gait Details: cues for looking up and posture, pt maintains hands clasped throughout gait. pt with one LOB when looking up during gait with min assist to correct   Stairs            Wheelchair Mobility    Modified Rankin (Stroke Patients Only)       Balance Overall  balance assessment: Needs assistance   Sitting balance-Leahy Scale: Good       Standing balance-Leahy Scale: Good                      Cognition Arousal/Alertness: Awake/alert Behavior During Therapy: WFL for tasks assessed/performed         Memory: Decreased short-term memory Following Commands: Follows one step commands consistently       General Comments: slow to process and decreased recall of precautions    Exercises General Exercises - Lower Extremity Long Arc Quad: AROM;Both;Seated;10 reps Heel Slides: AROM;Seated;Both;10 reps Hip ABduction/ADduction: AROM;Seated;Both;10 reps Hip Flexion/Marching: AROM;Both;10 reps;Seated Toe Raises: AROM;Seated;Both;10 reps Heel Raises: AROM;Seated;Both;10 reps    General Comments        Pertinent Vitals/Pain Faces Pain Scale: Hurts a little bit Pain Location: sore on chest where tape and leads pull on skin  BP seated at start: 101/67 (75) BP during ambulation: 100/63 (71) BP seated at end: 118/72 (83) Max HR during ambulation: 124    Home Living                      Prior Function            PT Goals (current goals can now be found in the care plan section) Progress towards PT goals: Progressing toward goals    Frequency       PT Plan Current plan remains appropriate  Co-evaluation             End of Session   Activity Tolerance: Patient tolerated treatment well Patient left: in chair;with call bell/phone within reach     Time: 1006-1032 PT Time Calculation (min) (ACUTE ONLY): 26 min  Charges:  $Gait Training: 8-22 mins $Therapeutic Exercise: 8-22 mins                    G Codes:      Karlen Barbar SPT 08/11/2014, 10:54 AM

## 2014-08-11 NOTE — Progress Notes (Signed)
8 Days Post-Op Procedure(s) (LRB): CORONARY ARTERY BYPASS GRAFTING (CABG) (N/A) TRANSESOPHAGEAL ECHOCARDIOGRAM (TEE) (N/A) Subjective:  Complains of some sternal incision pain  Objective: Vital signs in last 24 hours: Temp:  [97.5 F (36.4 C)-98.7 F (37.1 C)] 97.5 F (36.4 C) (12/18 0709) Pulse Rate:  [72-124] 101 (12/18 0800) Cardiac Rhythm:  [-] Normal sinus rhythm (12/18 0730) Resp:  [8-29] 23 (12/18 0800) BP: (65-122)/(35-84) 101/64 mmHg (12/18 0800) SpO2:  [94 %-100 %] 99 % (12/18 0800) Weight:  [65.8 kg (145 lb 1 oz)] 65.8 kg (145 lb 1 oz) (12/18 0500)  Hemodynamic parameters for last 24 hours: CVP:  [0 mmHg-12 mmHg] 3 mmHg  Intake/Output from previous day: 12/17 0701 - 12/18 0700 In: 2480.4 [P.O.:540; I.V.:1440.4; IV Piggyback:500] Out: 1610 [Urine:1335; Stool:2] Intake/Output this shift: Total I/O In: 114 [P.O.:60; I.V.:54] Out: 40 [Urine:40]  General appearance: alert and cooperative Heart: regular rate and rhythm, S1, S2 normal, no murmur, click, rub or gallop Lungs: clear to auscultation bilaterally Abdomen: soft, non-tender; bowel sounds normal; no masses,  no organomegaly Extremities: extremities normal, atraumatic, no cyanosis or edema Wound: incision ok  Lab Results:  Recent Labs  08/09/14 1015  WBC 6.4  HGB 10.1*  HCT 30.6*  PLT 364   BMET:  Recent Labs  08/10/14 0400 08/11/14 0455  NA 136* 135*  K 4.1 3.9  CL 98 99  CO2 26 25  GLUCOSE 89 95  BUN 14 11  CREATININE 0.81 0.74  CALCIUM 8.5 8.5    PT/INR: No results for input(s): LABPROT, INR in the last 72 hours. ABG    Component Value Date/Time   PHART 7.329* 08/03/2014 1905   HCO3 26.0* 08/03/2014 1905   TCO2 25 08/05/2014 1820   ACIDBASEDEF 3.0* 08/03/2014 1754   O2SAT 74.6 08/11/2014 0610   CBG (last 3)   Recent Labs  08/08/14 1113 08/08/14 1507  GLUCAP 110* 94    Assessment/Plan: S/P Procedure(s) (LRB): CORONARY ARTERY BYPASS GRAFTING (CABG) (N/A) TRANSESOPHAGEAL  ECHOCARDIOGRAM (TEE) (N/A)  He has been hemodynamically stable on dobutamine 4 and midodrine 10 tid. Co-ox is 75%. Will wean dobutamine when heart failure team feels its is appropriate. CVP is 3. He is eating and drinking some but probably not enough. Will continue some IVF for now and continue to encourage po intake. Will DC foley. I think he can go to [address] on dobutamine. He does not require ICU care at this time and change of scenery will be good for him.   LOS: 15 days    BARTLE,BRYAN K 08/11/2014

## 2014-08-11 NOTE — Progress Notes (Signed)
Waiting on bed in PTCU  BP 111/89 mmHg  Pulse 101  Temp(Src) 98.3 F (36.8 C) (Oral)  Resp 19  Ht 6' (1.829 m)  Wt 145 lb 1 oz (65.8 kg)  BMI 19.67 kg/m2  SpO2 96%   Intake/Output Summary (Last 24 hours) at 08/11/14 1900 Last data filed at 08/11/14 1700  Gross per 24 hour  Intake   2049 ml  Output   1020 ml  Net   1029 ml    On low dose dobutamine  Hasn't voided since Foley removed- will give until 10 PM then replace

## 2014-08-12 LAB — BASIC METABOLIC PANEL
Anion gap: 11 (ref 5–15)
BUN: 10 mg/dL (ref 6–23)
CHLORIDE: 99 meq/L (ref 96–112)
CO2: 25 mEq/L (ref 19–32)
Calcium: 8.9 mg/dL (ref 8.4–10.5)
Creatinine, Ser: 0.72 mg/dL (ref 0.50–1.35)
GFR calc Af Amer: >90 mL/min (ref >90)
GFR calc non Af Amer: >90 mL/min (ref >90)
GLUCOSE: 112 mg/dL — AB (ref 70–99)
POTASSIUM: 3.9 meq/L (ref 3.7–5.3)
Sodium: 135 mEq/L — ABNORMAL LOW (ref 137–147)

## 2014-08-12 LAB — CARBOXYHEMOGLOBIN
Carboxyhemoglobin: 1.6 % — ABNORMAL HIGH (ref 0.5–1.5)
Methemoglobin: 0.7 % (ref 0.0–1.5)
O2 SAT: 64.8 %
TOTAL HEMOGLOBIN: 9.4 g/dL — AB (ref 13.5–18.0)

## 2014-08-12 LAB — GLUCOSE, CAPILLARY: Glucose-Capillary: 109 mg/dL — ABNORMAL HIGH (ref 70–99)

## 2014-08-12 MED ORDER — ACETAMINOPHEN 325 MG PO TABS
650.0000 mg | ORAL_TABLET | ORAL | Status: DC | PRN
  Administered 2014-08-17: 650 mg via ORAL
  Filled 2014-08-12: qty 2

## 2014-08-12 MED ORDER — TRAMADOL HCL 50 MG PO TABS
50.0000 mg | ORAL_TABLET | Freq: Four times a day (QID) | ORAL | Status: DC | PRN
  Administered 2014-08-13 – 2014-08-16 (×4): 50 mg via ORAL
  Filled 2014-08-12 (×4): qty 1

## 2014-08-12 NOTE — Progress Notes (Signed)
Patient ID: Carl [name], male   DOB: 1950/11/26, 63 y.o.   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 Troponin was 24 and EKG showed anteroseptal MI with lat presentation. Per Dr Cyndia Bent he had [place] cath at [facility] that showed severe 3 vessel CAD and EF 15%. He was transferred to [facility] for further evaluation. Dr Cyndia Bent was consulted and on 08/03/14 he had CABG x 5.   Due to ongoing inability to wean pressors and concerned for low output the Heart Failure Team was requested. All drips weaned off except Neo and Dopamine. He has remained hypotensive. On midodrine was added. Not currently on diuretics. Last dose of lasix was on 08/07/14.   Patient is now off dopamine and on dobutamine at 2.  Co-ox 65%.  SBP 90s-100s. CVP 6 today.   No dyspnea or chest pain.  Test  ECHO 08/08/14 EF 15-20% Grade II DD.     Objective:   Weight Range:  Vital Signs:   Temp:  [97.8 F (36.6 C)-98.5 F (36.9 C)] 98.5 F (36.9 C) (12/19 0700) Pulse Rate:  [90-120] 101 (12/19 0800) Resp:  [18-31] 26 (12/19 0800) BP: (92-123)/(51-89) 98/68 mmHg (12/19 0800) SpO2:  [92 %-100 %] 99 % (12/19 0800) Weight:  [146 lb 6.2 oz (66.4 kg)] 146 lb 6.2 oz (66.4 kg) (12/19 0500) Last BM Date: 08/10/14  Weight change: Filed Weights   08/10/14 0500 08/11/14 0500 08/12/14 0500  Weight: 144 lb 2.9 oz (65.4 kg) 145 lb 1 oz (65.8 kg) 146 lb 6.2 oz (66.4 kg)    Intake/Output:   Intake/Output Summary (Last 24 hours) at 08/12/14 0833 Last data filed at 08/12/14 0600  Gross per 24 hour  Intake   1771 ml  Output    735 ml  Net   1036 ml     Physical Exam: CVP 5 General: Elderly. No resp difficulty. Sitting in chair. HEENT: normal Neck: supple. JVP flat . Carotids 2+ bilat; no bruits. No lymphadenopathy or thryomegaly  appreciated. Cor: PMI nondisplaced. Regular rate & rhythm. No rubs, gallops or murmurs.mid sternal incision approximated Lungs: clear Abdomen: soft, nontender, nondistended. No hepatosplenomegaly. No bruits or masses. Good bowel sounds. Extremities: no cyanosis, clubbing, rash, edema Neuro: alert & orientedx3, cranial nerves grossly intact. moves all 4 extremities w/o difficulty. Affect pleasant  Telemetry: SR with PVCs 90s   Labs: Basic Metabolic Panel:  Recent Labs Lab 08/06/14 0330 08/07/14 0223 08/09/14 1015 08/10/14 0400 08/11/14 0455  NA 134* 132* 136* 136* 135*  K 4.4 4.1 4.6 4.1 3.9  CL 97 94* 96 98 99  CO2 27 29 27 26 25   GLUCOSE 88 102* 97 89 95  BUN 15 14 17 14 11   CREATININE 0.66 0.80 0.81 0.81 0.74  CALCIUM 8.6 8.9 9.0 8.5 8.5    Liver Function Tests:  Recent Labs Lab 08/06/14 0330 08/09/14 1015  AST 42* 49*  ALT 67* 65*  ALKPHOS 56 77  BILITOT 0.7 1.0  PROT 5.4* 6.0  ALBUMIN 2.7* 2.9*   No results for input(s): LIPASE, AMYLASE in the last 168 hours. No results for input(s): AMMONIA in the last 168 hours.  CBC:  Recent Labs Lab 08/05/14 1820 08/06/14 0330 08/07/14 0223 08/09/14 1015  WBC  --  5.8 5.8 6.4  HGB 10.9* 9.2* 9.6* 10.1*  HCT 32.0* 27.5* 29.2* 30.6*  MCV  --  96.5 93.6 94.4  PLT  --  179 242 364    Cardiac Enzymes: No results for input(s): CKTOTAL, CKMB, CKMBINDEX, TROPONINI in the last 168 hours.  BNP: BNP (last 3 results) No results for input(s): PROBNP in the last 8760 hours.   Other results:  EKG:   Imaging: No results found.   Medications:     Scheduled Medications: . aspirin EC  325 mg Oral Daily  . atorvastatin  40 mg Oral q1800  . digoxin  0.125 mg Oral Daily  . docusate sodium  200 mg Oral Daily  . enoxaparin (LOVENOX) injection  40 mg Subcutaneous QHS  . feeding supplement (ENSURE COMPLETE)  237 mL Oral Q1500  . feeding supplement (RESOURCE BREEZE)  1 Container Oral BID  . midodrine  10 mg Oral  TID WC  . pantoprazole  40 mg Oral QAC breakfast    Infusions: . lactated ringers 50 mL/hr at 08/12/14 0500    PRN Medications: bisacodyl **OR** bisacodyl, ondansetron **OR** ondansetron (ZOFRAN) IV   Assessment:  1. CAD---> S/P CABG x5 Left Internal mammary graft to LAD, SVG to diagonal, sequential SVG to OM2 and OM3, and SVG to PDA.  2. ICM Systolic Heart Failure EF 15-20% noted on ECHO 08/08/14  3. Hypotension 4. Tobacco Abuse 5. Mild Mental Retardation    Plan/Discussion:   CVP 6. Good co-ox at 65%.  He is now on midodrine and digoxin as well.  Will stop dobutamine today.  Repeat co-ox in am.  Will need to ambulate to make sure he is not dizzy.  Continue ASA and statin.   Length of Stay: [place]  08/12/2014, 8:33 AM

## 2014-08-12 NOTE — Progress Notes (Signed)
9 Days Post-Op Procedure(s) (LRB): CORONARY ARTERY BYPASS GRAFTING (CABG) (N/A) TRANSESOPHAGEAL ECHOCARDIOGRAM (TEE) (N/A) Subjective: Some incisional discomfort Ambulated yesterday but says he did feel a little dizzy  Objective: Vital signs in last 24 hours: Temp:  [97.8 F (36.6 C)-98.5 F (36.9 C)] 98.5 F (36.9 C) (12/19 0700) Pulse Rate:  [90-120] 102 (12/19 0700) Cardiac Rhythm:  [-] Sinus tachycardia (12/19 0137) Resp:  [18-31] 26 (12/19 0700) BP: (92-123)/(51-89) 107/61 mmHg (12/19 0700) SpO2:  [92 %-100 %] 99 % (12/19 0700) Weight:  [146 lb 6.2 oz (66.4 kg)] 146 lb 6.2 oz (66.4 kg) (12/19 0500)  Hemodynamic parameters for last 24 hours: CVP:  [3 mmHg-6 mmHg] 6 mmHg  Intake/Output from previous day: 12/18 0701 - 12/19 0700 In: 1885 [P.O.:480; I.V.:1405] Out: 775 [Urine:775] Intake/Output this shift:    General appearance: alert and cooperative Neurologic: intact Heart: regular rate and rhythm Lungs: diminished breath sounds bibasilar Wound: clean and dry  Lab Results:  Recent Labs  08/09/14 1015  WBC 6.4  HGB 10.1*  HCT 30.6*  PLT 364   BMET:  Recent Labs  08/10/14 0400 08/11/14 0455  NA 136* 135*  K 4.1 3.9  CL 98 99  CO2 26 25  GLUCOSE 89 95  BUN 14 11  CREATININE 0.81 0.74  CALCIUM 8.5 8.5    PT/INR: No results for input(s): LABPROT, INR in the last 72 hours. ABG    Component Value Date/Time   PHART 7.329* 08/03/2014 1905   HCO3 26.0* 08/03/2014 1905   TCO2 25 08/05/2014 1820   ACIDBASEDEF 3.0* 08/03/2014 1754   O2SAT 64.8 08/12/2014 0407   CBG (last 3)   Recent Labs  08/11/14 2348  GLUCAP 109*    Assessment/Plan: S/P Procedure(s) (LRB): CORONARY ARTERY BYPASS GRAFTING (CABG) (N/A) TRANSESOPHAGEAL ECHOCARDIOGRAM (TEE) (N/A) Plan for transfer to step-down: see transfer orders  Remains on dobutamine drip at 2- being weaned by Heart Failure team Slowly progressing Awaiting bed in PTCU   LOS: 16 days     Carl [name] C 08/12/2014

## 2014-08-12 NOTE — Progress Notes (Signed)
C/o incisional pain- has no meds ordered  BP 105/66 mmHg  Pulse 111  Temp(Src) 98.8 F (37.1 C) (Oral)  Resp 20  Ht 6' (1.829 m)  Wt 146 lb 6.2 oz (66.4 kg)  BMI 19.85 kg/m2  SpO2 99%   Intake/Output Summary (Last 24 hours) at 08/12/14 1903 Last data filed at 08/12/14 1800  Gross per 24 hour  Intake   1690 ml  Output   1050 ml  Net    640 ml    Ambulated this evening  Off milrinone  Will order PRN tylenol/ ultram for pain

## 2014-08-13 LAB — BASIC METABOLIC PANEL
Anion gap: 12 (ref 5–15)
BUN: 11 mg/dL (ref 6–23)
CALCIUM: 8.5 mg/dL (ref 8.4–10.5)
CO2: 25 meq/L (ref 19–32)
Chloride: 101 mEq/L (ref 96–112)
Creatinine, Ser: 0.87 mg/dL (ref 0.50–1.35)
GFR calc non Af Amer: 90 mL/min — ABNORMAL LOW (ref >90)
Glucose, Bld: 104 mg/dL — ABNORMAL HIGH (ref 70–99)
Potassium: 4.1 mEq/L (ref 3.7–5.3)
SODIUM: 138 meq/L (ref 137–147)

## 2014-08-13 LAB — CBC
HCT: 27.7 % — ABNORMAL LOW (ref 39.0–52.0)
Hemoglobin: 9.4 g/dL — ABNORMAL LOW (ref 13.0–17.0)
MCH: 32.6 pg (ref 26.0–34.0)
MCHC: 33.9 g/dL (ref 30.0–36.0)
MCV: 96.2 fL (ref 78.0–100.0)
Platelets: 415 10*3/uL — ABNORMAL HIGH (ref 150–400)
RBC: 2.88 MIL/uL — AB (ref 4.22–5.81)
RDW: 14.2 % (ref 11.5–15.5)
WBC: 4.6 10*3/uL (ref 4.0–10.5)

## 2014-08-13 LAB — CARBOXYHEMOGLOBIN
Carboxyhemoglobin: 1.3 % (ref 0.5–1.5)
Methemoglobin: 0.9 % (ref 0.0–1.5)
O2 SAT: 59.3 %
TOTAL HEMOGLOBIN: 9.7 g/dL — AB (ref 13.5–18.0)

## 2014-08-13 NOTE — Progress Notes (Signed)
Patient ID: Carl [name], male   DOB: 1950/12/24, 63 y.o.   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 Troponin was 24 and EKG showed anteroseptal MI with lat presentation. Per Dr Cyndia Bent he had [place] cath at [facility] that showed severe 3 vessel CAD and EF 15%. He was transferred to [facility] for further evaluation. Dr Cyndia Bent was consulted and on 08/03/14 he had CABG x 5.   Due to ongoing inability to wean pressors and concerned for low output the Heart Failure Team was requested. All drips weaned off except Neo and Dopamine. He has remained hypotensive. On midodrine was added. Not currently on diuretics. Last dose of lasix was on 08/07/14.   Patient is now off dopamine and on dobutamine at 2.  Co-ox down to 59 from 65% yesterday.  SBP 85s-112. Weight 146--> 144, negative fluid balance - 0.8 liter, CVP 6 yeserday, not checked today, he was transferred to the regular floor. He is doing well, started to walk.    No dyspnea or chest pain.  Test  ECHO 08/08/14 EF 15-20% Grade II DD.  Objective:   Weight Range:  Vital Signs:   Temp:  [97.3 F (36.3 C)-98.8 F (37.1 C)] 97.8 F (36.6 C) (12/20 0700) Pulse Rate:  [27-115] 93 (12/20 1018) Resp:  [13-27] 13 (12/20 1000) BP: (85-112)/(43-80) 86/51 mmHg (12/20 1000) SpO2:  [91 %-100 %] 98 % (12/20 1000) Weight:  [144 lb 13.5 oz (65.7 kg)] 144 lb 13.5 oz (65.7 kg) (12/20 0430) Last BM Date: 08/12/14  Weight change: Filed Weights   08/11/14 0500 08/12/14 0500 08/13/14 0430  Weight: 145 lb 1 oz (65.8 kg) 146 lb 6.2 oz (66.4 kg) 144 lb 13.5 oz (65.7 kg)   Intake/Output:   Intake/Output Summary (Last 24 hours) at 08/13/14 1103 Last data filed at 08/13/14 0930  Gross per 24 hour  Intake    790 ml  Output   1600 ml  Net   -810 ml   Physical Exam: CVP 5 General: Elderly. No resp  difficulty. Sitting in chair. HEENT: normal Neck: supple. JVP flat . Carotids 2+ bilat; no bruits. No lymphadenopathy or thryomegaly appreciated. Cor: PMI nondisplaced. Regular rate & rhythm. No rubs, gallops or murmurs.mid sternal incision approximated Lungs: clear Abdomen: soft, nontender, nondistended. No hepatosplenomegaly. No bruits or masses. Good bowel sounds. Extremities: no cyanosis, clubbing, rash, edema Neuro: alert & orientedx3, cranial nerves grossly intact. moves all 4 extremities w/o difficulty. Affect pleasant  Telemetry: SR with PVCs 90s   Labs: Basic Metabolic Panel:  Recent Labs Lab 08/09/14 1015 08/10/14 0400 08/11/14 0455 08/12/14 0900 08/13/14 0328  NA 136* 136* 135* 135* 138  K 4.6 4.1 3.9 3.9 4.1  CL 96 98 99 99 101  CO2 27 26 25 25 25   GLUCOSE 97 89 95 112* 104*  BUN 17 14 11 10 11   CREATININE 0.81 0.81 0.74 0.72 0.87  CALCIUM 9.0 8.5 8.5 8.9 8.5    Liver Function Tests:  Recent Labs Lab 08/09/14 1015  AST 49*  ALT 65*  ALKPHOS 77  BILITOT 1.0  PROT 6.0  ALBUMIN 2.9*   CBC:  Recent Labs Lab 08/07/14 0223 08/09/14 1015 08/13/14 0328  WBC 5.8 6.4 4.6  HGB 9.6* 10.1* 9.4*  HCT 29.2* 30.6* 27.7*  MCV 93.6 94.4 96.2  PLT 242 364 415*    Other results:  EKG:   Imaging: No results found.   Medications:    Scheduled Medications: . aspirin EC  325 mg Oral Daily  . atorvastatin  40 mg Oral q1800  . digoxin  0.125 mg Oral Daily  . docusate sodium  200 mg Oral Daily  . enoxaparin (LOVENOX) injection  40 mg Subcutaneous QHS  . feeding supplement (ENSURE COMPLETE)  237 mL Oral Q1500  . feeding supplement (RESOURCE BREEZE)  1 Container Oral BID  . midodrine  10 mg Oral TID WC  . pantoprazole  40 mg Oral QAC breakfast   Infusions: . lactated ringers Stopped (08/12/14 1800)    PRN Medications: acetaminophen, bisacodyl **OR** bisacodyl, ondansetron **OR** ondansetron (ZOFRAN) IV, traMADol   Assessment:   1. CAD---> S/P  CABG x5 Left Internal mammary graft to LAD, SVG to diagonal, sequential SVG to OM2 and OM3, and SVG to PDA.  2. ICM Systolic Heart Failure EF 15-20% noted on ECHO 08/08/14  3. Hypotension 4. Tobacco Abuse 5. Mild Mental Retardation    Plan/Discussion:    CVP 6. Ok co-ox at 59%, we will follow.  He is now on midodrine and digoxin as well.  Doing well off Dobutamine. Repeat co-ox in am.  Continue ASA and statin. Start physical therapy.   Length of Stay: [place], [place]  08/13/2014, 11:03 AM

## 2014-08-13 NOTE — Progress Notes (Signed)
      [address]       [place] [address]             [phone]        10 Days Post-Op Procedure(s) (LRB): CORONARY ARTERY BYPASS GRAFTING (CABG) (N/A) TRANSESOPHAGEAL ECHOCARDIOGRAM (TEE) (N/A)  Subjective: Patient with some incisional pain.  Objective: Vital signs in last 24 hours: Temp:  [97.3 F (36.3 C)-97.9 F (36.6 C)] 97.7 F (36.5 C) (12/20 1116) Pulse Rate:  [27-105] 96 (12/20 1116) Cardiac Rhythm:  [-] Normal sinus rhythm (12/20 1116) Resp:  [13-26] 13 (12/20 1000) BP: (83-112)/(44-80) 83/64 mmHg (12/20 1116) SpO2:  [91 %-100 %] 100 % (12/20 1116) Weight:  [144 lb 13.5 oz (65.7 kg)] 144 lb 13.5 oz (65.7 kg) (12/20 0430)  Pre op weight 64 kg Current Weight  08/13/14 144 lb 13.5 oz (65.7 kg)      Intake/Output from previous day: 12/19 0701 - 12/20 0700 In: 1270 [P.O.:720; I.V.:550] Out: 1325 [Urine:1325]   Physical Exam:  Cardiovascular: RRR Pulmonary: Clear to auscultation bilaterally; no rales, wheezes, or rhonchi. Abdomen: Soft, non tender, bowel sounds present. Extremities:Trace lower extremity edema. Wounds: Clean and dry.  No erythema or signs of infection.  Lab Results: CBC: Recent Labs  08/13/14 0328  WBC 4.6  HGB 9.4*  HCT 27.7*  PLT 415*   BMET:  Recent Labs  08/12/14 0900 08/13/14 0328  NA 135* 138  K 3.9 4.1  CL 99 101  CO2 25 25  GLUCOSE 112* 104*  BUN 10 11  CREATININE 0.72 0.87  CALCIUM 8.9 8.5    PT/INR:  Lab Results  Component Value Date   INR 1.27 08/03/2014   INR 1.25 07/27/2014   ABG:  INR: Will add last result for INR, ABG once components are confirmed Will add last 4 CBG results once components are confirmed  Assessment/Plan:  1. CV - SR. LVEF 15-20%. Co ox 59%.On Digoxin 0.125 daily, Midodrine 10 tid. Heart failure team following. Monitor BP as has been labile. 2.  Pulmonary - Encourage incentive spirometer 3.  Acute blood loss anemia - H and H 9.4 and 27.7. Start ferrous  sulfate. 4. PT to assist with ambulation  ZIMMERMAN,DONIELLE MPA-C 08/13/2014,6:38 PM

## 2014-08-14 ENCOUNTER — Inpatient Hospital Stay (HOSPITAL_COMMUNITY): Payer: Medicare HMO

## 2014-08-14 LAB — CBC
HEMATOCRIT: 27.8 % — AB (ref 39.0–52.0)
Hemoglobin: 9.1 g/dL — ABNORMAL LOW (ref 13.0–17.0)
MCH: 31.2 pg (ref 26.0–34.0)
MCHC: 32.7 g/dL (ref 30.0–36.0)
MCV: 95.2 fL (ref 78.0–100.0)
Platelets: 483 10*3/uL — ABNORMAL HIGH (ref 150–400)
RBC: 2.92 MIL/uL — ABNORMAL LOW (ref 4.22–5.81)
RDW: 14.2 % (ref 11.5–15.5)
WBC: 5 10*3/uL (ref 4.0–10.5)

## 2014-08-14 LAB — BASIC METABOLIC PANEL
Anion gap: 10 (ref 5–15)
BUN: 17 mg/dL (ref 6–23)
CALCIUM: 8.8 mg/dL (ref 8.4–10.5)
CO2: 27 mEq/L (ref 19–32)
CREATININE: 0.81 mg/dL (ref 0.50–1.35)
Chloride: 99 mEq/L (ref 96–112)
GLUCOSE: 97 mg/dL (ref 70–99)
POTASSIUM: 4.1 meq/L (ref 3.7–5.3)
Sodium: 136 mEq/L — ABNORMAL LOW (ref 137–147)

## 2014-08-14 LAB — CARBOXYHEMOGLOBIN
Carboxyhemoglobin: 1.5 % (ref 0.5–1.5)
METHEMOGLOBIN: 0.9 % (ref 0.0–1.5)
O2 Saturation: 80 %
TOTAL HEMOGLOBIN: 9.4 g/dL — AB (ref 13.5–18.0)

## 2014-08-14 MED ORDER — SODIUM CHLORIDE 0.9 % IJ SOLN
10.0000 mL | INTRAMUSCULAR | Status: DC | PRN
  Administered 2014-08-14 – 2014-08-15 (×2): 30 mL
  Filled 2014-08-14: qty 40

## 2014-08-14 NOTE — Progress Notes (Signed)
Patient ID: Carl [name], male   DOB: 12-15-1950, 63 y.o.   MRN: [id number]            Subjective:    Feels good. SBP in 80s on midodrine 10 tid. Co-ox 80% (? Accurate). Weight stable at 144.   Test  ECHO 08/08/14 EF 15-20% Grade II DD.  Objective:   Weight Range:  Vital Signs:   Temp:  [97.6 F (36.4 C)-98.2 F (36.8 C)] 98.2 F (36.8 C) (12/21 0403) Pulse Rate:  [95-100] 95 (12/21 0403) Resp:  [18] 18 (12/21 0403) BP: (87-98)/(55-61) 87/55 mmHg (12/21 0403) SpO2:  [97 %-100 %] 97 % (12/21 0403) Weight:  [65.7 kg (144 lb 13.5 oz)] 65.7 kg (144 lb 13.5 oz) (12/21 0403) Last BM Date: 08/13/14  Weight change: Filed Weights   08/12/14 0500 08/13/14 0430 08/14/14 0403  Weight: 66.4 kg (146 lb 6.2 oz) 65.7 kg (144 lb 13.5 oz) 65.7 kg (144 lb 13.5 oz)   Intake/Output:   Intake/Output Summary (Last 24 hours) at 08/14/14 1235 Last data filed at 08/14/14 0730  Gross per 24 hour  Intake    240 ml  Output    952 ml  Net   -712 ml   Physical Exam: CVP 5 General: Elderly. No resp difficulty. Sitting in chair. HEENT: normal Neck: supple. JVP flat . Carotids 2+ bilat; no bruits. No lymphadenopathy or thryomegaly appreciated. Cor: PMI nondisplaced. Regular rate & rhythm. No rubs, gallops or murmurs.mid sternal incision approximated Lungs: clear Abdomen: soft, nontender, nondistended. No hepatosplenomegaly. No bruits or masses. Good bowel sounds. Extremities: no cyanosis, clubbing, rash, edema Neuro: alert & orientedx3, cranial nerves grossly intact. moves all 4 extremities w/o difficulty. Affect pleasant  Telemetry: SR with PVCs 90s   Labs: Basic Metabolic Panel:  Recent Labs Lab 08/10/14 0400 08/11/14 0455 08/12/14 0900 08/13/14 0328 08/14/14 0430  NA 136* 135* 135* 138 136*  K 4.1 3.9 3.9 4.1 4.1  CL 98 99 99 101 99  CO2 26 25 25 25 27   GLUCOSE 89 95 112* 104* 97  BUN 14 11 10 11 17   CREATININE 0.81 0.74 0.72 0.87 0.81  CALCIUM 8.5 8.5 8.9 8.5 8.8    Liver  Function Tests:  Recent Labs Lab 08/09/14 1015  AST 49*  ALT 65*  ALKPHOS 77  BILITOT 1.0  PROT 6.0  ALBUMIN 2.9*   CBC:  Recent Labs Lab 08/09/14 1015 08/13/14 0328 08/14/14 0430  WBC 6.4 4.6 5.0  HGB 10.1* 9.4* 9.1*  HCT 30.6* 27.7* 27.8*  MCV 94.4 96.2 95.2  PLT 364 415* 483*    Other results:  EKG:   Imaging: Dg Chest 2 View  08/14/2014   CLINICAL DATA:  CABG.  EXAM: CHEST  2 VIEW  COMPARISON:  08/09/2014 .  FINDINGS: Left subclavian central line in stable position. Prior CABG. Stable cardiomegaly. Improvement of pulmonary venous congestion and bilateral pulmonary edema with mild residual interstitial edema noted. Small bilateral pleural effusions. No pneumothorax.  IMPRESSION: 1. Right subclavian line in stable position. 2. Prior CABG. Interim improvement of changes of congestive heart failure. There is mild residual bilateral pulmonary interstitial edema and small bilateral effusions.   Electronically Signed   By: Marcello Moores  Register   On: 08/14/2014 07:41     Medications:    Scheduled Medications: . aspirin EC  325 mg Oral Daily  . atorvastatin  40 mg Oral q1800  . digoxin  0.125 mg Oral Daily  . docusate sodium  200 mg Oral  Daily  . enoxaparin (LOVENOX) injection  40 mg Subcutaneous QHS  . feeding supplement (ENSURE COMPLETE)  237 mL Oral Q1500  . feeding supplement (RESOURCE BREEZE)  1 Container Oral BID  . midodrine  10 mg Oral TID WC  . pantoprazole  40 mg Oral QAC breakfast   Infusions: . lactated ringers Stopped (08/12/14 1800)    PRN Medications: acetaminophen, bisacodyl **OR** bisacodyl, ondansetron **OR** ondansetron (ZOFRAN) IV, traMADol   Assessment:   1. CAD---> S/P CABG x5 Left Internal mammary graft to LAD, SVG to diagonal, sequential SVG to OM2 and OM3, and SVG to PDA.  2. ICM Systolic Heart Failure EF 15-20% noted on ECHO 08/08/14  3. Hypotension 4. Tobacco Abuse 5. Mild Mental Retardation    Plan/Discussion:    He is stable  but tenuous. BP remains in 80s on midodrine and digoxin. Unable to start ace or bb. Weight stable. On d/c would use lasix 20 daily and kcl 20 daily prn only on days when weight is 147 or greater. Refer to cardiac rehab. Pending d/c to [facility]. F/u in HF Clinic next week.   We will sign off.   Length of Stay: 63  [name] MD 08/14/2014, 12:35 PM

## 2014-08-14 NOTE — Discharge Summary (Signed)
Physician Discharge Summary  Patient ID: Carl [name] MRN: [id number] DOB/AGE: Jun 18, 1951 63 y.o.  Admit date: 07/27/2014 Discharge date: 08/14/2014  Admission Diagnoses: Severe multivessel coronary artery disease and severe LV dysfunction status post late presentation of anteroseptal STEMI  Discharge Diagnoses:  Active Problems:   Protein-calorie malnutrition, severe   ST elevation myocardial infarction (STEMI) of anterolateral wall   Cardiogenic shock   Tobacco use   AKI (acute kidney injury)   Acute systolic heart failure   S/P CABG x 5  Patient Active Problem List   Diagnosis Date Noted  . 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 Acute systolic heart failure 62/13/0865  . Protein-calorie malnutrition, severe 07/29/2014  . ST elevation myocardial infarction (STEMI) of anterolateral wall 07/29/2014  . Cardiogenic shock 07/29/2014  . Tobacco use 07/29/2014  . AKI (acute kidney injury) 07/29/2014     HISTORY OF PRESENT ILLNESS: This is a 63 year old male with no previous cardiac history. He has known history of tobacco use, and mild mental retardation. He has strong family history of premature coronary artery disease. He has not been feeling well for about 2-3 weeks. He started having back discomfort radiating to both arms associated with dry cough and shortness of breath. He saw Dr. Ginette Pitman, his primary care physician. CT scan was done just before Thanksgiving and he was noted to have mild cardiomegaly and heart failure. There was possibility of pneumonia and he was started on Levaquin with no improvement in symptoms. Given worsened symptoms, he came to the Emergency Room yesterday and was found to have a troponin of 24. EKG showed anteroseptal ST elevation with lateral T wave changes and large anterior Q waves.   PAST MEDICAL HISTORY: Tobacco use and mild mental retardation.   ALLERGIES: No known drug allergies.   HOME MEDICATIONS INCLUDE: Lasix 40 mg daily, Levaquin 500 mg once daily,  potassium 10 mEq once daily and tramadol 50 mg twice daily.   SOCIAL HISTORY: He quit smoking a few months ago. He used to smoke 1 pack per day for at least 30 or 40 years. He denies any alcohol or recreational drug use. He lives at home with his mother.   FAMILY HISTORY: Remarkable for premature coronary artery disease. His father died of complications of myocardial infarction in his 82s. His brother had multiple cardiac stents.   Discharged Condition: good  Hospital Course: The patient was admitted to [facility] and ruled in for anteroseptal ST segment elevation myocardial infarction with late presentation. He was suspected to have ischemic cardiomyopathy and ischemic mitral regurgitation as well. An echocardiogram showed ejection fraction of 15% with moderate mitral regurgitation. He required aggressive critical care management including inotropic support. Cardiac catheterization revealed severe multivessel coronary artery disease and severe LV dysfunction with moderate ischemic mitral regurgitation. He was stabilized medically and cardiac surgical consultation was obtained with Gilford Raid M.D. Dr. Cyndia Bent evaluated the patient and his studies and agree with recommendations to proceed with coronary artery surgical revascularization. He was treated with aggressive medical management until time of surgery and did show significant stabilization. He was deemed to be acceptable to proceed with surgery on 08/03/2014 and underwent the following procedure:  CARDIOVASCULAR SURGERY OPERATIVE NOTE  08/03/2014  Surgeon: [name], MD  First Assistant: Jadene Pierini, PA-C   Preoperative Diagnosis: Severe multi-vessel coronary artery disease   Postoperative Diagnosis: Same   Procedure:  1. Median Sternotomy 2. Extracorporeal circulation 3. Coronary artery bypass grafting x 5   Left internal mammary  graft to the LAD  SVG to diagonal  Sequential SVG to OM2 and  OM3  SVG to PDA 4. Endoscopic vein harvest from the left leg 5. Insertion of left femoral arterial line The patient was then transported to the surgical intensive care unit in critical but stable condition.  Postoperative hospital course: Inotropic support was required in early postoperative period but was able to be weaned slowly over time. He was able to be weaned from the ventilator without significant difficulty. He has required aggressive diuresis and we have been assisted throughout the postoperative period by the congestive heart failure team. After discontinuation of inotropic support he did benefit from medial drain to help maintain blood pressure. He has not had any significant postoperative cardiac dysrhythmias. Due to overall postoperative deconditioning physical therapy and cardiac rehabilitation have assisted with ambulation as well as other rehabilitation modalities. He did have an expected acute blood loss anemia which is stable. Incisions are noted to be healing well without evidence of infection. He is tolerating diet. Oxygen has been weaned and he maintains adequate saturations on room air. At this time it is felt that he would best benefit from ongoing rehabilitation in a skilled nursing facility for a short-term. Tentatively he is felt to be stable for this transfer in the next 24-48 hours pending ongoing reevaluation of his overall condition.   Anesthesia: General Endotracheal  Consults: CHF Team  Discharge Exam: Blood pressure 87/61, pulse 100, temperature 97.6 F (36.4 C), temperature source Oral, resp. rate 18, height 6' (1.829 m), weight 144 lb 13.5 oz (65.7 kg), SpO2 99 %.   General appearance: alert, cooperative and no distress Heart: regular rate and rhythm Lungs: clear to auscultation bilaterally Abdomen: benign Extremities: no edema Wound: incis healing well     sposition: Final discharge disposition not confirmed The patient has been discharged  on:   1.Beta Blocker:  Yes [   ]                              No   [ n  ]                              If No, reason:not tolerating  2.Ace Inhibitor/ARB: Yes [   ]                                     No  [ n   ]                                     If No, reason: not tolerating  3.Statin:   Yes Blue.Reese   ]                  No  [   ]                  If No, reason:  4.Ecasa:  Yes  Blue.Reese   ]                  No   [   ]                  If No, reason:    1. Please  obtain vital signs at least one time daily 2.Please weigh the patient daily. If he or she continues to gain weight or develops lower extremity edema, contact the office at [phone]. 3. Ambulate patient at least three times daily and please use sternal precautions.    Medication List    ASK your doctor about these medications        furosemide 40 MG tablet  Commonly known as:  LASIX  Take 40 mg by mouth daily.     levofloxacin 500 MG tablet  Commonly known as:  LEVAQUIN  Take 500 mg by mouth daily.     potassium chloride 10 MEQ tablet  Commonly known as:  K-DUR,KLOR-CON  Take 10 mEq by mouth daily.     traMADol 50 MG tablet  Commonly known as:  ULTRAM  Take 50 mg by mouth 2 (two) times daily as needed.         Medication List    STOP taking these medications        levofloxacin 500 MG tablet  Commonly known as:  LEVAQUIN      TAKE these medications        aspirin 325 MG EC tablet  Take 1 tablet (325 mg total) by mouth daily.     atorvastatin 40 MG tablet  Commonly known as:  LIPITOR  Take 1 tablet (40 mg total) by mouth daily at 6 PM.     digoxin 0.125 MG tablet  Commonly known as:  LANOXIN  Take 1 tablet (0.125 mg total) by mouth daily.     feeding supplement (ENSURE COMPLETE) Liqd  Take 237 mLs by mouth 3 (three) times daily with meals.     furosemide 40 MG tablet  Commonly known as:  LASIX  Take 0.5 tablets (20 mg total) by mouth daily. On days the patient is 147 pounds or greater      midodrine 10 MG tablet  Commonly known as:  PROAMATINE  Take 1 tablet (10 mg total) by mouth 3 (three) times daily with meals.     potassium chloride 10 MEQ tablet  Commonly known as:  K-DUR,KLOR-CON  Take 1 tablet (10 mEq total) by mouth daily. Take only on days that takes lasix     traMADol 50 MG tablet  Commonly known as:  ULTRAM  Take 1 tablet (50 mg total) by mouth every 6 (six) hours as needed for severe pain.       Follow-up Information    Follow up with [name], MD.   Specialty:  Cardiothoracic Surgery   Contact information:   [address] [place] [address] [phone]       Follow up with [name], MD.   Specialty:  Cardiology   Why:  arrange for appt in one week at heart failure clinic   Contact information:   [address] [place] [address] [phone]       Follow up with Ida Rogue, MD.   Specialty:  Cardiology   Why:  09/08/14 at 8 am to see cardiologist    Contact information:   [place] [address] [phone]        Signed: John Giovanni 08/14/2014, 3:17 PM

## 2014-08-14 NOTE — Progress Notes (Signed)
CARDIAC REHAB PHASE I   PRE:  Rate/Rhythm: 107 ST with PAC/PVCs    BP: sitting 80/56, standing 80/60    SaO2: 98 RA  MODE:  Ambulation: 550 ft   POST:  Rate/Rhythm: 124 ST    BP: sitting 96/60     SaO2: 96 RA  BP low but no sx. Able to walk without assistive device with min assist. Quick pace. Only c/o is back. HR elevated. Return to recliner.  [phone]   Josephina Shih Craig CES, ACSM 08/14/2014 9:46 AM

## 2014-08-14 NOTE — Progress Notes (Addendum)
[address]       [place] [address]             [phone]      11 Days Post-Op Procedure(s) (LRB): CORONARY ARTERY BYPASS GRAFTING (CABG) (N/A) TRANSESOPHAGEAL ECHOCARDIOGRAM (TEE) (N/A) Subjective: Feels ok, no specific complaints except back is sore   Objective: Vital signs in last 24 hours: Temp:  [97.6 F (36.4 C)-98.2 F (36.8 C)] 98.2 F (36.8 C) (12/21 0403) Pulse Rate:  [83-100] 95 (12/21 0403) Cardiac Rhythm:  [-] Normal sinus rhythm (12/20 2222) Resp:  [13-22] 18 (12/21 0403) BP: (83-100)/(51-64) 87/55 mmHg (12/21 0403) SpO2:  [97 %-100 %] 97 % (12/21 0403) Weight:  [144 lb 13.5 oz (65.7 kg)] 144 lb 13.5 oz (65.7 kg) (12/21 0403)  Hemodynamic parameters for last 24 hours:    Intake/Output from previous day: 12/20 0701 - 12/21 0700 In: 240 [P.O.:240] Out: 827 [Urine:825; Stool:2] Intake/Output this shift:    General appearance: alert, cooperative and no distress Heart: irregularly irregular rhythm Lungs: mildly dim in the bases Abdomen: benign Extremities: no edema Wound: incis healing well  Lab Results:  Recent Labs  08/13/14 0328 08/14/14 0430  WBC 4.6 5.0  HGB 9.4* 9.1*  HCT 27.7* 27.8*  PLT 415* 483*   BMET:  Recent Labs  08/13/14 0328 08/14/14 0430  NA 138 136*  K 4.1 4.1  CL 101 99  CO2 25 27  GLUCOSE 104* 97  BUN 11 17  CREATININE 0.87 0.81  CALCIUM 8.5 8.8    PT/INR: No results for input(s): LABPROT, INR in the last 72 hours. ABG    Component Value Date/Time   PHART 7.329* 08/03/2014 1905   HCO3 26.0* 08/03/2014 1905   TCO2 25 08/05/2014 1820   ACIDBASEDEF 3.0* 08/03/2014 1754   O2SAT 80.0 08/14/2014 0500   CBG (last 3)   Recent Labs  08/11/14 2348  GLUCAP 109*    Meds Scheduled Meds: . aspirin EC  325 mg Oral Daily  . atorvastatin  40 mg Oral q1800  . digoxin  0.125 mg Oral Daily  . docusate sodium  200 mg Oral Daily  . enoxaparin (LOVENOX) injection  40 mg Subcutaneous QHS  .  feeding supplement (ENSURE COMPLETE)  237 mL Oral Q1500  . feeding supplement (RESOURCE BREEZE)  1 Container Oral BID  . midodrine  10 mg Oral TID WC  . pantoprazole  40 mg Oral QAC breakfast   Continuous Infusions: . lactated ringers Stopped (08/12/14 1800)   PRN Meds:.acetaminophen, bisacodyl **OR** bisacodyl, ondansetron **OR** ondansetron (ZOFRAN) IV, traMADol  Xrays Dg Chest 2 View  08/14/2014   CLINICAL DATA:  CABG.  EXAM: CHEST  2 VIEW  COMPARISON:  08/09/2014 .  FINDINGS: Left subclavian central line in stable position. Prior CABG. Stable cardiomegaly. Improvement of pulmonary venous congestion and bilateral pulmonary edema with mild residual interstitial edema noted. Small bilateral pleural effusions. No pneumothorax.  IMPRESSION: 1. Right subclavian line in stable position. 2. Prior CABG. Interim improvement of changes of congestive heart failure. There is mild residual bilateral pulmonary interstitial edema and small bilateral effusions.   Electronically Signed   By: Marcello Moores  Register   On: 08/14/2014 07:41    Assessment/Plan: S/P Procedure(s) (LRB): CORONARY ARTERY BYPASS GRAFTING (CABG) (N/A) TRANSESOPHAGEAL ECHOCARDIOGRAM (TEE) (N/A)  1 conts to improve, co-ox upto 80 2 labs stable 3 CXR improved CHF 4 rhythm sinus tach  with PAC's/PVC's- defer to heart failure team as to whether too add  beta blocker 5 push rehab as able    LOS: 18 days    Carl [name],Carl [name] 08/14/2014  Waiting for SNP I have seen and examined Carl [name] and agree with the above assessment  and plan.  Grace Isaac MD Beeper [phone] Office [phone] 08/14/2014 2:40 PM

## 2014-08-14 NOTE — Discharge Instructions (Signed)
Heart Failure °Heart failure is a condition in which the heart has trouble pumping blood. This means your heart does not pump blood efficiently for your body to work well. In some cases of heart failure, fluid may back up into your lungs or you may have swelling (edema) in your lower legs. Heart failure is usually a long-term (chronic) condition. It is important for you to take good care of yourself and follow your health care provider's treatment plan. °CAUSES  °Some health conditions can cause heart failure. Those health conditions include: °· High blood pressure (hypertension). Hypertension causes the heart muscle to work harder than normal. When pressure in the blood vessels is high, the heart needs to pump (contract) with more force in order to circulate blood throughout the body. High blood pressure eventually causes the heart to become stiff and weak. °· Coronary artery disease (CAD). CAD is the buildup of cholesterol and fat (plaque) in the arteries of the heart. The blockage in the arteries deprives the heart muscle of oxygen and blood. This can cause chest pain and may lead to a heart attack. High blood pressure can also contribute to CAD. °· Heart attack (myocardial infarction). A heart attack occurs when one or more arteries in the heart become blocked. The loss of oxygen damages the muscle tissue of the heart. When this happens, part of the heart muscle dies. The injured tissue does not contract as well and weakens the heart's ability to pump blood. °· Abnormal heart valves. When the heart valves do not open and close properly, it can cause heart failure. This makes the heart muscle pump harder to keep the blood flowing. °· Heart muscle disease (cardiomyopathy or myocarditis). Heart muscle disease is damage to the heart muscle from a variety of causes. These can include drug or alcohol abuse, infections, or unknown reasons. These can increase the risk of heart failure. °· Lung disease. Lung disease  makes the heart work harder because the lungs do not work properly. This can cause a strain on the heart, leading it to fail. °· Diabetes. Diabetes increases the risk of heart failure. High blood sugar contributes to high fat (lipid) levels in the blood. Diabetes can also cause slow damage to tiny blood vessels that carry important nutrients to the heart muscle. When the heart does not get enough oxygen and food, it can cause the heart to become weak and stiff. This leads to a heart that does not contract efficiently. °· Other conditions can contribute to heart failure. These include abnormal heart rhythms, thyroid problems, and low blood counts (anemia). °Certain unhealthy behaviors can increase the risk of heart failure, including: °· Being overweight. °· Smoking or chewing tobacco. °· Eating foods high in fat and cholesterol. °· Abusing illicit drugs or alcohol. °· Lacking physical activity. °SYMPTOMS  °Heart failure symptoms may vary and can be hard to detect. Symptoms may include: °· Shortness of breath with activity, such as climbing stairs. °· Persistent cough. °· Swelling of the feet, ankles, legs, or abdomen. °· Unexplained weight gain. °· Difficulty breathing when lying flat (orthopnea). °· Waking from sleep because of the need to sit up and get more air. °· Rapid heartbeat. °· Fatigue and loss of energy. °· Feeling light-headed, dizzy, or close to fainting. °· Loss of appetite. °· Nausea. °· Increased urination during the night (nocturia). °DIAGNOSIS  °A diagnosis of heart failure is based on your history, symptoms, physical examination, and diagnostic tests. Diagnostic tests for heart failure may include: °·   Echocardiography. °· Electrocardiography. °· Chest X-ray. °· Blood tests. °· Exercise stress test. °· Cardiac angiography. °· Radionuclide scans. °TREATMENT  °Treatment is aimed at managing the symptoms of heart failure. Medicines, behavioral changes, or surgical intervention may be necessary to  treat heart failure. °· Medicines to help treat heart failure may include: °¨ Angiotensin-converting enzyme (ACE) inhibitors. This type of medicine blocks the effects of a blood protein called angiotensin-converting enzyme. ACE inhibitors relax (dilate) the blood vessels and help lower blood pressure. °¨ Angiotensin receptor blockers (ARBs). This type of medicine blocks the actions of a blood protein called angiotensin. Angiotensin receptor blockers dilate the blood vessels and help lower blood pressure. °¨ Water pills (diuretics). Diuretics cause the kidneys to remove salt and water from the blood. The extra fluid is removed through urination. This loss of extra fluid lowers the volume of blood the heart pumps. °¨ Beta blockers. These prevent the heart from beating too fast and improve heart muscle strength. °¨ Digitalis. This increases the force of the heartbeat. °· Healthy behavior changes include: °¨ Obtaining and maintaining a healthy weight. °¨ Stopping smoking or chewing tobacco. °¨ Eating heart-healthy foods. °¨ Limiting or avoiding alcohol. °¨ Stopping illicit drug use. °¨ Physical activity as directed by your health care provider. °· Surgical treatment for heart failure may include: °¨ A procedure to open blocked arteries, repair damaged heart valves, or remove damaged heart muscle tissue. °¨ A pacemaker to improve heart muscle function and control certain abnormal heart rhythms. °¨ An internal cardioverter defibrillator to treat certain serious abnormal heart rhythms. °¨ A left ventricular assist device (LVAD) to assist the pumping ability of the heart. °HOME CARE INSTRUCTIONS  °· Take medicines only as directed by your health care provider. Medicines are important in reducing the workload of your heart, slowing the progression of heart failure, and improving your symptoms. °¨ Do not stop taking your medicine unless directed by your health care provider. °¨ Do not skip any dose of medicine. °¨ Refill your  prescriptions before you run out of medicine. Your medicines are needed every day. °· Engage in moderate physical activity if directed by your health care provider. Moderate physical activity can benefit some people. The elderly and people with severe heart failure should consult with a health care provider for physical activity recommendations. °· Eat heart-healthy foods. Food choices should be free of trans fat and low in saturated fat, cholesterol, and salt (sodium). Healthy choices include fresh or frozen fruits and vegetables, fish, lean meats, legumes, fat-free or low-fat dairy products, and whole grain or high fiber foods. Talk to a dietitian to learn more about heart-healthy foods. °· Limit sodium if directed by your health care provider. Sodium restriction may reduce symptoms of heart failure in some people. Talk to a dietitian to learn more about heart-healthy seasonings. °· Use healthy cooking methods. Healthy cooking methods include roasting, grilling, broiling, baking, poaching, steaming, or stir-frying. Talk to a dietitian to learn more about healthy cooking methods. °· Limit fluids if directed by your health care provider. Fluid restriction may reduce symptoms of heart failure in some people. °· Weigh yourself every day. Daily weights are important in the early recognition of excess fluid. You should weigh yourself every morning after you urinate and before you eat breakfast. Wear the same amount of clothing each time you weigh yourself. Record your daily weight. Provide your health care provider with your weight record. °· Monitor and record your blood pressure if directed by your health care   provider.  Check your pulse if directed by your health care provider.  Lose weight if directed by your health care provider. Weight loss may reduce symptoms of heart failure in some people.  Stop smoking or chewing tobacco. Nicotine makes your heart work harder by causing your blood vessels to constrict.  Do not use nicotine gum or patches before talking to your health care provider.  Keep all follow-up visits as directed by your health care provider. This is important.  Limit alcohol intake to no more than 1 drink per day for nonpregnant women and 2 drinks per day for men. One drink equals 12 ounces of beer, 5 ounces of wine, or 1 ounces of hard liquor. Drinking more than that is harmful to your heart. Tell your health care provider if you drink alcohol several times a week. Talk with your health care provider about whether alcohol is safe for you. If your heart has already been damaged by alcohol or you have severe heart failure, drinking alcohol should be stopped completely.  Stop illicit drug use.  Stay up-to-date with immunizations. It is especially important to prevent respiratory infections through current pneumococcal and influenza immunizations.  Manage other health conditions such as hypertension, diabetes, thyroid disease, or abnormal heart rhythms as directed by your health care provider.  Learn to manage stress.  Plan rest periods when fatigued.  Learn strategies to manage high temperatures. If the weather is extremely hot:  Avoid vigorous physical activity.  Use air conditioning or fans or seek a cooler location.  Avoid caffeine and alcohol.  Wear loose-fitting, lightweight, and light-colored clothing.  Learn strategies to manage cold temperatures. If the weather is extremely cold:  Avoid vigorous physical activity.  Layer clothes.  Wear mittens or gloves, a hat, and a scarf when going outside.  Avoid alcohol.  Obtain ongoing education and support as needed.  Participate in or seek rehabilitation as needed to maintain or improve independence and quality of life. SEEK MEDICAL CARE IF:   Your weight increases by 03 lb/1.4 kg in 1 day or 05 lb/2.3 kg in a week.  You have increasing shortness of breath that is unusual for you.  You are unable to participate in  your usual physical activities.  You tire easily.  You cough more than normal, especially with physical activity.  You have any or more swelling in areas such as your hands, feet, ankles, or abdomen.  You are unable to sleep because it is hard to breathe.  You feel like your heart is beating fast (palpitations).  You become dizzy or light-headed upon standing up. SEEK IMMEDIATE MEDICAL CARE IF:   You have difficulty breathing.  There is a change in mental status such as decreased alertness or difficulty with concentration.  You have a pain or discomfort in your chest.  You have an episode of fainting (syncope). MAKE SURE YOU:   Understand these instructions.  Will watch your condition.  Will get help right away if you are not doing well or get worse. Document Released: 08/11/2005 Document Revised: 12/26/2013 Document Reviewed: 09/10/2012 [facility] Patient Information 2015 [place], [place]. This information is not intended to replace advice given to you by your health care provider. Make sure you discuss any questions you have with your health care provider. Endoscopic Saphenous Vein Harvesting Care After Refer to this sheet in the next few weeks. These instructions provide you with information on caring for yourself after your procedure. Your health care provider may also give you more  specific instructions. Your treatment has been planned according to current medical practices, but problems sometimes occur. Call your health care provider if you have any problems or questions after your procedure. HOME CARE INSTRUCTIONS Medicine  Take whatever pain medicine your surgeon prescribes. Follow the directions carefully. Do not take over-the-counter pain medicine unless your surgeon says it is okay. Some pain medicine can cause bleeding problems for several weeks after surgery.  Follow your surgeon's instructions about driving. You will probably not be permitted to drive after heart  surgery.  Take any medicines your surgeon prescribes. Any medicines you took before your heart surgery should be checked with your health care provider before you start taking them again. Wound care  If your surgeon has prescribed an elastic bandage or stocking, ask how long you should wear it.  Check the area around your surgical cuts (incisions) whenever your bandages (dressings) are changed. Look for any redness or swelling.  You will need to return to have the stitches (sutures) or staples taken out. Ask your surgeon when to do that.  Ask your surgeon when you can shower or bathe. Activity  Try to keep your legs raised when you are sitting.  Do any exercises your health care providers have given you. These may include deep breathing exercises, coughing, walking, or other exercises. SEEK MEDICAL CARE IF:  You have any questions about your medicines.  You have more leg pain, especially if your pain medicine stops working.  New or growing bruises develop on your leg.  Your leg swells, feels tight, or becomes red.  You have numbness in your leg. SEEK IMMEDIATE MEDICAL CARE IF:  Your pain gets much worse.  Blood or fluid leaks from any of the incisions.  Your incisions become warm, swollen, or red.  You have chest pain.  You have trouble breathing.  You have a fever.  You have more pain near your leg incision. MAKE SURE YOU:  Understand these instructions.  Will watch your condition.  Will get help right away if you are not doing well or get worse. Document Released: 04/23/2011 Document Revised: 08/16/2013 Document Reviewed: 04/23/2011 [facility] Patient Information 2015 [place], [place]. This information is not intended to replace advice given to you by your health care provider. Make sure you discuss any questions you have with your health care provider. Coronary Artery Bypass Grafting, Care After These instructions give you information on caring for yourself after  your procedure. Your doctor may also give you more specific instructions. Call your doctor if you have any problems or questions after your procedure.  HOME CARE  Only take medicine as told by your doctor. Take medicines exactly as told. Do not stop taking medicines or start any new medicines without talking to your doctor first.  Take your pulse as told by your doctor.  Do deep breathing as told by your doctor. Use your breathing device (incentive spirometer), if given, to practice deep breathing several times a day. Support your chest with a pillow or your arms when you take deep breaths or cough.  Keep the area clean, dry, and protected where the surgery cuts (incisions) were made. Remove bandages (dressings) only as told by your doctor. If strips were applied to surgical area, do not take them off. They fall off on their own.  Check the surgery area daily for puffiness (swelling), redness, or leaking fluid.  If surgery cuts were made in your legs:  Avoid crossing your legs.  Avoid sitting for long periods  of time. Change positions every 30 minutes.  Raise your legs when you are sitting. Place them on pillows.  Wear stockings that help keep blood clots from forming in your legs (compression stockings).  Only take sponge baths until your doctor says it is okay to take showers. Pat the surgery area dry. Do not rub the surgery area with a washcloth or towel. Do not bathe, swim, or use a hot tub until your doctor says it is okay.  Eat foods that are high in fiber. These include raw fruits and vegetables, whole grains, beans, and nuts. Choose lean meats. Avoid canned, processed, and fried foods.  Drink enough fluids to keep your pee (urine) clear or pale yellow.  Weigh yourself every day.  Rest and limit activity as told by your doctor. You may be told to:  Stop any activity if you have chest pain, shortness of breath, changes in heartbeat, or dizziness. Get help right away if this  happens.  Move around often for short amounts of time or take short walks as told by your doctor. Gradually become more active. You may need help to strengthen your muscles and build endurance.  Avoid lifting, pushing, or pulling anything heavier than 10 pounds (4.5 kg) for at least 6 weeks after surgery.  Do not drive until your doctor says it is okay.  Ask your doctor when you can go back to work.  Ask your doctor when you can begin sexual activity again.  Follow up with your doctor as told. GET HELP IF:  You have puffiness, redness, more pain, or fluid draining from the incision site.  You have a fever.  You have puffiness in your ankles or legs.  You have pain in your legs.  You gain 2 or more pounds (0.9 kg) a day.  You feel sick to your stomach (nauseous) or throw up (vomit).  You have watery poop (diarrhea). GET HELP RIGHT AWAY IF:  You have chest pain that goes to your jaw or arms.  You have shortness of breath.  You have a fast or irregular heartbeat.  You notice a "clicking" in your breastbone when you move.  You have numbness or weakness in your arms or legs.  You feel dizzy or light-headed. MAKE SURE YOU:  Understand these instructions.  Will watch your condition.  Will get help right away if you are not doing well or get worse. Document Released: 08/16/2013 Document Reviewed: 08/16/2013 [facility] Patient Information 2015 [place], [place]. This information is not intended to replace advice given to you by your health care provider. Make sure you discuss any questions you have with your health care provider.

## 2014-08-14 NOTE — Clinical Social Work Placement (Signed)
Clinical Social Work Department CLINICAL SOCIAL WORK PLACEMENT NOTE 08/14/2014  Patient:  Carl [name], Carl [name]  Account Number:  [id number] Admit date:  07/27/2014  Clinical Social Worker:  Jerimiah Wolman, LCSWA  Date/time:  08/09/2014 03:59 PM  Clinical Social Work is seeking post-discharge placement for this patient at the following level of care:   SKILLED NURSING   (*CSW will update this form in Epic as items are completed)   08/09/2014  Patient/family provided with [place] Department of Clinical Social Work's list of facilities offering this level of care within the geographic area requested by the patient (or if unable, by the patient's family).  08/09/2014  Patient/family informed of their freedom to choose among providers that offer the needed level of care, that participate in Medicare, Medicaid or managed care program needed by the patient, have an available bed and are willing to accept the patient.  08/09/2014  Patient/family informed of [facility]' ownership interest in [facility], as well as of the fact that they are under no obligation to receive care at this facility.  PASARR submitted to EDS on 08/09/2014 PASARR number received on 08/14/2014  FL2 transmitted to all facilities in geographic area requested by pt/family on  08/14/2014 FL2 transmitted to all facilities within larger geographic area on 08/14/2014  Patient informed that his/her managed care company has contracts with or will negotiate with  certain facilities, including the following:     Patient/family informed of bed offers received:  08/14/2014 Patient chooses bed at [facility] Physician recommends and patient chooses bed at    Patient to be transferred to  on   Patient to be transferred to facility by  Patient and family notified of transfer on  Name of family member notified:    The following physician request were entered in Epic:   Additional Comments:   Griffyn Kucinski R. [place],  MSW, [place] 08/14/2014 5:29 PM

## 2014-08-14 NOTE — Clinical Social Work Note (Signed)
Contacted patient and his brother to discuss bed placement offers.  Patient and his brother wanted to go to [facility], but there are not any beds available.  Discussed with patient's brother other options and he agreed to go with [facility] in [place] based on location.  [facility] who confirmed bed was available for discharge tomorrow if he is medically ready and discharge orders have been received.  Jones Broom. [place], MSW, [place] 08/14/2014 5:26 PM

## 2014-08-15 LAB — CARBOXYHEMOGLOBIN
Carboxyhemoglobin: 1.5 % (ref 0.5–1.5)
Methemoglobin: 0.9 % (ref 0.0–1.5)
O2 Saturation: 64.5 %
Total hemoglobin: 9.3 g/dL — ABNORMAL LOW (ref 13.5–18.0)

## 2014-08-15 MED ORDER — TRAMADOL HCL 50 MG PO TABS: 50.0000 mg | ORAL_TABLET | Freq: Four times a day (QID) | ORAL | Status: DC | PRN

## 2014-08-15 MED ORDER — ENSURE COMPLETE PO LIQD: 237.0000 mL | Freq: Three times a day (TID) | ORAL | Status: DC

## 2014-08-15 MED ORDER — POTASSIUM CHLORIDE CRYS ER 10 MEQ PO TBCR: 10.0000 meq | EXTENDED_RELEASE_TABLET | Freq: Every day | ORAL | Status: DC

## 2014-08-15 MED ORDER — DIGOXIN 125 MCG PO TABS: 0.1250 mg | ORAL_TABLET | Freq: Every day | ORAL | Status: DC

## 2014-08-15 MED ORDER — ASPIRIN 325 MG PO TBEC: 325.0000 mg | DELAYED_RELEASE_TABLET | Freq: Every day | ORAL | Status: DC

## 2014-08-15 MED ORDER — MIDODRINE HCL 10 MG PO TABS: 10.0000 mg | ORAL_TABLET | Freq: Three times a day (TID) | ORAL | Status: DC

## 2014-08-15 MED ORDER — FUROSEMIDE 40 MG PO TABS: 20.0000 mg | ORAL_TABLET | Freq: Every day | ORAL | Status: DC

## 2014-08-15 MED ORDER — ATORVASTATIN CALCIUM 40 MG PO TABS: 40.0000 mg | ORAL_TABLET | Freq: Every day | ORAL | Status: DC

## 2014-08-15 NOTE — Progress Notes (Addendum)
[address]       [place] [address]             [phone]      12 Days Post-Op Procedure(s) (LRB): CORONARY ARTERY BYPASS GRAFTING (CABG) (N/A) TRANSESOPHAGEAL ECHOCARDIOGRAM (TEE) (N/A) Subjective: Feels fine, no complaints  Objective: Vital signs in last 24 hours: Temp:  [97.6 F (36.4 C)-98.3 F (36.8 C)] 98.1 F (36.7 C) (12/22 0606) Pulse Rate:  [88-100] 91 (12/22 0700) Cardiac Rhythm:  [-] Normal sinus rhythm (12/21 2130) Resp:  [18] 18 (12/22 0606) BP: (82-92)/(50-61) 90/50 mmHg (12/22 0700) SpO2:  [98 %-99 %] 98 % (12/22 0606) Weight:  [143 lb 3.2 oz (64.955 kg)] 143 lb 3.2 oz (64.955 kg) (12/22 0606)  Hemodynamic parameters for last 24 hours:    Intake/Output from previous day: 12/21 0701 - 12/22 0700 In: 990 [P.O.:960; I.V.:30] Out: 726 [Urine:725; Stool:1] Intake/Output this shift:    General appearance: alert, cooperative and no distress Heart: regular rate and rhythm Lungs: clear to auscultation bilaterally Abdomen: benign Extremities: no edema Wound: incis healing well  Lab Results:  Recent Labs  08/13/14 0328 08/14/14 0430  WBC 4.6 5.0  HGB 9.4* 9.1*  HCT 27.7* 27.8*  PLT 415* 483*   BMET:  Recent Labs  08/13/14 0328 08/14/14 0430  NA 138 136*  K 4.1 4.1  CL 101 99  CO2 25 27  GLUCOSE 104* 97  BUN 11 17  CREATININE 0.87 0.81  CALCIUM 8.5 8.8    PT/INR: No results for input(s): LABPROT, INR in the last 72 hours. ABG    Component Value Date/Time   PHART 7.329* 08/03/2014 1905   HCO3 26.0* 08/03/2014 1905   TCO2 25 08/05/2014 1820   ACIDBASEDEF 3.0* 08/03/2014 1754   O2SAT 64.5 08/15/2014 0512   CBG (last 3)  No results for input(s): GLUCAP in the last 72 hours.  Meds Scheduled Meds: . aspirin EC  325 mg Oral Daily  . atorvastatin  40 mg Oral q1800  . digoxin  0.125 mg Oral Daily  . docusate sodium  200 mg Oral Daily  . enoxaparin (LOVENOX) injection  40 mg Subcutaneous QHS  . feeding  supplement (ENSURE COMPLETE)  237 mL Oral Q1500  . feeding supplement (RESOURCE BREEZE)  1 Container Oral BID  . midodrine  10 mg Oral TID WC  . pantoprazole  40 mg Oral QAC breakfast   Continuous Infusions: . lactated ringers Stopped (08/12/14 1800)   PRN Meds:.acetaminophen, bisacodyl **OR** bisacodyl, ondansetron **OR** ondansetron (ZOFRAN) IV, sodium chloride, traMADol  Xrays Dg Chest 2 View  08/14/2014   CLINICAL DATA:  CABG.  EXAM: CHEST  2 VIEW  COMPARISON:  08/09/2014 .  FINDINGS: Left subclavian central line in stable position. Prior CABG. Stable cardiomegaly. Improvement of pulmonary venous congestion and bilateral pulmonary edema with mild residual interstitial edema noted. Small bilateral pleural effusions. No pneumothorax.  IMPRESSION: 1. Right subclavian line in stable position. 2. Prior CABG. Interim improvement of changes of congestive heart failure. There is mild residual bilateral pulmonary interstitial edema and small bilateral effusions.   Electronically Signed   By: Marcello Moores  Register   On: 08/14/2014 07:41    Assessment/Plan: S/P Procedure(s) (LRB): CORONARY ARTERY BYPASS GRAFTING (CABG) (N/A) TRANSESOPHAGEAL ECHOCARDIOGRAM (TEE) (N/A)  1 conts to do well 2 d/c pacing wires and central line 3 tx to snf later today    LOS: 19 days    GOLD,[name] 08/15/2014  For SNF today I  have seen and examined [name] and agree with the above assessment  and plan.  Grace Isaac MD [facility] [phone] Office [phone] 08/15/2014 11:38 AM

## 2014-08-15 NOTE — Clinical Social Work Note (Addendum)
[facility] which is the SNF that patient chooses to find out status of insurance authorization.  SNF informed this CSW that insurance needed PT eval faxed over to facility.  Faxed over SNF requested items awaiting call back from SNF with the authorization verification.  Contacted patient's brother [name] to inform him of the update about patient.  Received a phone call from SNF admissions who informed this CSW that authorization has not been received yet.  SNF admissions coordinator contacted insurance company again to find out what the status is and left a message with the worker.  CSW asked SNF if they could take a Letter of Guarantee (LOG) in order for patient to discharge tonight, facility said they do not take LOGs.  Notified charge nurse and patient's nurse that patient's insurance company has not given authorization yet.  Notified patient and his brother [name] of update on insurance authorization.  Plan for patient to discharge tomorrow to [employer] once authorization has been received and discharge orders have been provided.   Jones Broom. [place], MSW, [place] 08/15/2014 5:25 PM

## 2014-08-15 NOTE — Progress Notes (Signed)
Utilization review completed.  

## 2014-08-15 NOTE — Progress Notes (Signed)
EPW removed per protocol, intact patient tolerated well. Instructed bedrest for 1 hour, bed alarm set, routine vital signs began. 97/53, heart rate 92, room sat 98% Joylene Draft A

## 2014-08-15 NOTE — Progress Notes (Signed)
Physical Therapy Treatment Patient Details Name: Carl [name] MRN: [id number] DOB: 10/21/1950 Today's Date: 08/15/2014    History of Present Illness Mr Benally was admitted with 2-3 weeks of not feeling well.  On admission determined to have suffered a STEMI,  Pt now s/p CABG x5    PT Comments    Pt progressing well with gait but remains unable to recall or follow precautions with bed and standing transfers. Pt educated for precautions with pt positioned in chair for breakfast. No dizziness or LOB with mobility today. Will continue to follow to maximize adherence to precautions with mobility.   Follow Up Recommendations  SNF     Equipment Recommendations       Recommendations for Other Services       Precautions / Restrictions Precautions Precautions: Fall;Sternal Precaution Comments: pt unable to state sternal precautions    Mobility  Bed Mobility Overal bed mobility: Needs Assistance Bed Mobility: Rolling;Sidelying to Sit Rolling: Min guard Sidelying to sit: Min guard       General bed mobility comments: cues for sequence and precautions as pt forgets not to push or pull  Transfers Overall transfer level: Needs assistance   Transfers: Sit to/from Stand Sit to Stand: Supervision         General transfer comment: cues for hand placement, sequence, safety and precautions  Ambulation/Gait Ambulation/Gait assistance: Supervision Ambulation Distance (Feet): 800 Feet Assistive device: None Gait Pattern/deviations: WFL(Within Functional Limits)   Gait velocity interpretation: at or above normal speed for age/gender General Gait Details: pt with decreased arm swing and quick gait today   Stairs            Wheelchair Mobility    Modified Rankin (Stroke Patients Only)       Balance     Sitting balance-Leahy Scale: Good Sitting balance - Comments: pt able to don shoes EOB without LOB or assist     Standing balance-Leahy Scale: Good                       Cognition Arousal/Alertness: Awake/alert Behavior During Therapy: Flat affect Overall Cognitive Status: History of cognitive impairments - at baseline                 General Comments: slow to process and decreased recall of precautions    Exercises General Exercises - Lower Extremity Long Arc Quad: AROM;Seated;Both;20 reps Hip Flexion/Marching: AROM;Seated;Both;15 reps    General Comments        Pertinent Vitals/Pain Faces Pain Scale: Hurts a little bit Pain Location: bil ankles sore Pain Intervention(s): Repositioned    Home Living                      Prior Function            PT Goals (current goals can now be found in the care plan section) Progress towards PT goals: Progressing toward goals    Frequency       PT Plan Current plan remains appropriate    Co-evaluation             End of Session   Activity Tolerance: Patient tolerated treatment well Patient left: in chair;with call bell/phone within reach     Time: 0757-0815 PT Time Calculation (min) (ACUTE ONLY): 18 min  Charges:  $Gait Training: 8-22 mins                    G Codes:  Lanetta Inch Beth 08/15/2014, 8:20 AM Elwyn Reach, [place]

## 2014-08-15 NOTE — Progress Notes (Addendum)
[id number] Cardiac Rehab Pt has no family present and none are planning to come to the hospital. Completed discharge education with pt. He will not always answer questions when I ask him, seems not engaged. We discussed Outpt CRP, he agrees to referral to [facility]. I left information sheets on diet, CHF packet, exercise guidelines and the Outpt. CRP in [place]. I placed recovery from heart surgery video on for pt to watch. I discussed smoking cessation with pt. He states that he quit a few months ago and plans not to smoke. I encouraged continued cessation. I left pt smoking cessation information. Deon Pilling, RN 08/15/2014 10:16 AM

## 2014-08-16 NOTE — Progress Notes (Signed)
Triple lumen central catheter removed per order utilizing sterile technique per protocol. Catheter tip intact. Pt tolerated without incident.

## 2014-08-16 NOTE — Progress Notes (Signed)
NUTRITION FOLLOW UP  Intervention:   -Continue with Ensure Complete po daily, each supplement provides 350 kcal and 13 grams of protein -Continue with Resource Breeze po BID, each supplement provides 250 kcal and 9 grams of protein -RD to continue to follow for plan of care  Nutrition Dx:   Malnutrition related to chronic illness as evidenced by severe fat and muscle depletion, ongoing  Goal:   Pt to meet >/= 90% of their estimated nutrition needs, progressing  Monitor:   PO & supplemental intake, weight, labs, I/O's  Assessment:   Pt seen at [facility] due to SOB. Cath on 12/3 showed 5 vessel disease (>90%), tx to [facility] for CABG eval.  Pt admitted with anteroseptal ST elevation myocardial infarction with late presentation. Pt lives at home with his mom, hx of mild mental retardation.   Patient s/p procedures 12/10: CORONARY ARTERY BYPASS GRAFTING (CABG) x 5 TRANSESOPHAGEAL ECHOCARDIOGRAM (TEE) EVH: LEFT LEG  Pt working with physical therapy at time of visit. He is awaiting insurance authorization to d/c to SNF. Intake has improved greatly since last week. Meal completion 25-100%. Also receives Ensure Complete daily and Resource Breeze daily- accepting supplements well. Labs reviewed. Na: 136.   Height: Ht Readings from Last 1 Encounters:  08/03/14 6' (1.829 m)    Weight Status:   Wt Readings from Last 1 Encounters:  08/16/14 142 lb 12.8 oz (64.774 kg)     08/09/14 145 lb 15.1 oz (66.2 kg)   Re-estimated needs:  Kcal: 1600-1800 kcals Protein: 71-81 grams Fluid: 1.6-1.8 L  Skin: DTI to rt and lt heels, closed lt leg and chest incisions, skin abrasions  Diet Order: Diet regular   Intake/Output Summary (Last 24 hours) at 08/16/14 1353 Last data filed at 08/16/14 1337  Gross per 24 hour  Intake    702 ml  Output    200 ml  Net    502 ml    Last BM: 08/15/14   Labs:   Recent Labs Lab 08/12/14 0900 08/13/14 0328 08/14/14 0430  NA 135* 138 136*  K 3.9 4.1 4.1   CL 99 101 99  CO2 25 25 27   BUN 10 11 17   CREATININE 0.72 0.87 0.81  CALCIUM 8.9 8.5 8.8  GLUCOSE 112* 104* 97    CBG (last 3)  No results for input(s): GLUCAP in the last 72 hours.  Scheduled Meds: . aspirin EC  325 mg Oral Daily  . atorvastatin  40 mg Oral q1800  . digoxin  0.125 mg Oral Daily  . docusate sodium  200 mg Oral Daily  . enoxaparin (LOVENOX) injection  40 mg Subcutaneous QHS  . feeding supplement (ENSURE COMPLETE)  237 mL Oral Q1500  . feeding supplement (RESOURCE BREEZE)  1 Container Oral BID  . midodrine  10 mg Oral TID WC  . pantoprazole  40 mg Oral QAC breakfast    Continuous Infusions: . lactated ringers Stopped (08/12/14 1800)    Ethyle Tiedt A. Jimmye Norman, RD, LDN Pager: [phone] After hours Pager: [phone]

## 2014-08-16 NOTE — Clinical Social Work Note (Signed)
Patient does not meet criteria according to insurance company for skilled at this time.  Spoke to [facility] supervisor who confirms patient does not have skilled diagnose or condition, in addition family has devised a plan for patient's daughter Barnett Applebaum to stay with patient in order for him to have home health, therefore recommendation to discharge home with home health.  Jones Broom. [place], MSW, [place] 08/16/2014 6:43 PM

## 2014-08-16 NOTE — Clinical Social Work Note (Addendum)
Received phone call from [facility] saying Mcarthur Rossetti will not approve patient for SNF placement because he is ambulating 800 feet per PT note.  Informed patient's brother that the insurance denied coverage for patient.  Informed patient's brother that a peer to peer phone call from the physician can be done with [facility], contacted physician and gave the phone number for [employer]'s peer to peer number.  Discussed with patient's brother that home health would be the other option.  Received phone call from physician office informing CSW that a message for peer to peer was left for a call back.  If insurance does not change the decision, patient's other option will be to discharge with home health orders. Jones Broom. [place], MSW, [place] 08/16/2014 4:30 PM

## 2014-08-16 NOTE — Progress Notes (Signed)
1425 Pt observed walking independently with fast pace. Walked earlier with PT. We will continue to follow. Graylon Good RN BSN 08/16/2014 2:37 PM

## 2014-08-16 NOTE — Progress Notes (Addendum)
      [address]       [place] [address]             [phone]      13 Days Post-Op Procedure(s) (LRB): CORONARY ARTERY BYPASS GRAFTING (CABG) (N/A) TRANSESOPHAGEAL ECHOCARDIOGRAM (TEE) (N/A) Subjective: Awaiting insurance approvals for SNF, remains quite stable  Objective: Vital signs in last 24 hours: Temp:  [97.6 F (36.4 C)-98.2 F (36.8 C)] 98.2 F (36.8 C) (12/23 0501) Pulse Rate:  [93-108] 98 (12/23 0501) Cardiac Rhythm:  [-] Normal sinus rhythm (12/22 1231) Resp:  [18] 18 (12/23 0501) BP: (78-114)/(46-66) 81/51 mmHg (12/23 0501) SpO2:  [96 %-99 %] 97 % (12/23 0501) Weight:  [142 lb 12.8 oz (64.774 kg)] 142 lb 12.8 oz (64.774 kg) (12/23 0501)  Hemodynamic parameters for last 24 hours:    Intake/Output from previous day: 12/22 0701 - 12/23 0700 In: 240 [P.O.:240] Out: 200 [Urine:200] Intake/Output this shift:    General appearance: alert, cooperative and no distress Heart: regular rate and rhythm Lungs: clear to auscultation bilaterally Abdomen: benign Extremities: no edema Wound: incis healing well  Lab Results:  Recent Labs  08/14/14 0430  WBC 5.0  HGB 9.1*  HCT 27.8*  PLT 483*   BMET:  Recent Labs  08/14/14 0430  NA 136*  K 4.1  CL 99  CO2 27  GLUCOSE 97  BUN 17  CREATININE 0.81  CALCIUM 8.8    PT/INR: No results for input(s): LABPROT, INR in the last 72 hours. ABG    Component Value Date/Time   PHART 7.329* 08/03/2014 1905   HCO3 26.0* 08/03/2014 1905   TCO2 25 08/05/2014 1820   ACIDBASEDEF 3.0* 08/03/2014 1754   O2SAT 64.5 08/15/2014 0512   CBG (last 3)  No results for input(s): GLUCAP in the last 72 hours.  Meds Scheduled Meds: . aspirin EC  325 mg Oral Daily  . atorvastatin  40 mg Oral q1800  . digoxin  0.125 mg Oral Daily  . docusate sodium  200 mg Oral Daily  . enoxaparin (LOVENOX) injection  40 mg Subcutaneous QHS  . feeding supplement (ENSURE COMPLETE)  237 mL Oral Q1500  . feeding  supplement (RESOURCE BREEZE)  1 Container Oral BID  . midodrine  10 mg Oral TID WC  . pantoprazole  40 mg Oral QAC breakfast   Continuous Infusions: . lactated ringers Stopped (08/12/14 1800)   PRN Meds:.acetaminophen, bisacodyl **OR** bisacodyl, ondansetron **OR** ondansetron (ZOFRAN) IV, sodium chloride, traMADol  Xrays No results found.  Assessment/Plan: S/P Procedure(s) (LRB): CORONARY ARTERY BYPASS GRAFTING (CABG) (N/A) TRANSESOPHAGEAL ECHOCARDIOGRAM (TEE) (N/A)  1 remains stable for d/c to SNF when bed available    LOS: 20 days    GOLD,[name] 08/16/2014  Plan snf today I have seen and examined Carl [name] and agree with the above assessment  and plan.  Grace Isaac MD [place] [phone] Office [phone] 08/16/2014 1:44 PM

## 2014-08-16 NOTE — Progress Notes (Signed)
Physical Therapy Treatment Patient Details Name: Carl [name] MRN: [id number] DOB: [birth year] Today's Date: 2014-08-20    History of Present Illness Carl [name] was admitted with 2-3 weeks of not feeling well.  On admission determined to have suffered a STEMI,  Pt now s/p CABG x5    PT Comments    Pt moving well with gait and does not require physical assist with tranfers. Remains unable to states or adhere to precautions with mobility without cues. Pt performed sit<>stand x 2 and able to perform without UE assist but only with max cues. Will continue to follow with decreased frequency to focus on transfers and precautions.   Follow Up Recommendations  SNF     Equipment Recommendations  None recommended by PT    Recommendations for Other Services       Precautions / Restrictions Precautions Precautions: Fall;Sternal Precaution Comments: pt unable to state sternal precautions and requires constant cueing with transfers to maintain    Mobility  Bed Mobility               General bed mobility comments: in chair before and after activity  Transfers Overall transfer level: Needs assistance   Transfers: Sit to/from Stand Sit to Stand: Supervision         General transfer comment: cues for hand placement, sequence, safety and precautions  Ambulation/Gait Ambulation/Gait assistance: Supervision Ambulation Distance (Feet): 800 Feet Assistive device: None Gait Pattern/deviations: WFL(Within Functional Limits)   Gait velocity interpretation: at or above normal speed for age/gender General Gait Details: pt with decreased arm swing and quick gait today   Stairs            Wheelchair Mobility    Modified Rankin (Stroke Patients Only)       Balance                                    Cognition Arousal/Alertness: Awake/alert Behavior During Therapy: Flat affect Overall Cognitive Status: History of cognitive impairments - at baseline                  General Comments: slow to process and does not recall  precautions    Exercises      General Comments        Pertinent Vitals/Pain Faces Pain Scale: Hurts a little bit Pain Location: bil ankles  Pain Intervention(s): Repositioned    Home Living                      Prior Function            PT Goals (current goals can now be found in the care plan section) Progress towards PT goals: Progressing toward goals    Frequency  Min 2X/week    PT Plan Frequency needs to be updated;Current plan remains appropriate    Co-evaluation             End of Session   Activity Tolerance: Patient tolerated treatment well Patient left: in chair;with call bell/phone within reach     Time: 1141-1151 PT Time Calculation (min) (ACUTE ONLY): 10 min  Charges:  $Gait Training: 8-22 mins                    G Codes:      Melford Aase Aug 20, 2014, 11:56 AM Elwyn Reach, [place]

## 2014-08-17 NOTE — Clinical Social Work Note (Signed)
Spoke to [facility] who were able to get approval for patient to go to [facility] informed PA to write discharge order.  Patient to be d/c'ed today to [facility].  Patient and family agreeable to plans will transport via ems RN to call report.  Evette Cristal, MSW, [place]

## 2014-08-17 NOTE — Progress Notes (Signed)
1005 Pt off telemetry and sitting in recliner. Offered to walk with pt but he declined due to ankles hurting. Stated did not want to walk. Walked 800 ft with PT yesterday and observed pt walking independently later. Pt does not know if he is going home or not. Will continue to follow. Can walk with staff later if not discharged. Graylon Good RN BSN 08/17/2014 10:07 AM

## 2014-08-17 NOTE — Clinical Social Work Note (Addendum)
Received a phone call from patient's brother [name] who informed this CSW that if patient has to discharge home with home health, they have arranged to have patient's daughter stay with him for a few days, they have also arranged to have neighbors help out as well.  Spoke to PA about asking if patient can get orders for home health if insurance appeal is unsuccessful, PA informed CSW he is waiting to here what the results are of the peer to peer with insurance company.  Contacted SNF [facility] and [facility] to inform the facility that physician is going to call insurance company to try to appeal the denied coverage decision and inquired if SNF could take patient if he discharges today.  Left message for facility awaiting call back.  Jones Broom. [place], MSW, [place] 08/17/2014 10:23 AM

## 2014-08-17 NOTE — Progress Notes (Addendum)
      [address]       [place] [address]             [phone]      14 Days Post-Op Procedure(s) (LRB): CORONARY ARTERY BYPASS GRAFTING (CABG) (N/A) TRANSESOPHAGEAL ECHOCARDIOGRAM (TEE) (N/A) Subjective: Feeling well   Objective: Vital signs in last 24 hours: Temp:  [97.5 F (36.4 C)-98.5 F (36.9 C)] 98.5 F (36.9 C) (12/24 0425) Pulse Rate:  [86-97] 86 (12/24 0425) Cardiac Rhythm:  [-] Normal sinus rhythm (12/23 2230) Resp:  [18-21] 21 (12/24 0425) BP: (88-96)/(58-66) 91/58 mmHg (12/24 0425) SpO2:  [96 %-100 %] 96 % (12/24 0425) Weight:  [141 lb 4.8 oz (64.093 kg)] 141 lb 4.8 oz (64.093 kg) (12/24 0425)  Hemodynamic parameters for last 24 hours:    Intake/Output from previous day: 12/23 0701 - 12/24 0700 In: 1062 [P.O.:1062] Out: 851 [Urine:850; Stool:1] Intake/Output this shift:    General appearance: alert, cooperative and no distress Heart: regular rate and rhythm Lungs: clear to auscultation bilaterally Abdomen: benign Extremities: no edema Wound: incis healing well  Lab Results: No results for input(s): WBC, HGB, HCT, PLT in the last 72 hours. BMET: No results for input(s): NA, K, CL, CO2, GLUCOSE, BUN, CREATININE, CALCIUM in the last 72 hours.  PT/INR: No results for input(s): LABPROT, INR in the last 72 hours. ABG    Component Value Date/Time   PHART 7.329* 08/03/2014 1905   HCO3 26.0* 08/03/2014 1905   TCO2 25 08/05/2014 1820   ACIDBASEDEF 3.0* 08/03/2014 1754   O2SAT 64.5 08/15/2014 0512   CBG (last 3)  No results for input(s): GLUCAP in the last 72 hours.  Meds Scheduled Meds: . aspirin EC  325 mg Oral Daily  . atorvastatin  40 mg Oral q1800  . digoxin  0.125 mg Oral Daily  . docusate sodium  200 mg Oral Daily  . enoxaparin (LOVENOX) injection  40 mg Subcutaneous QHS  . feeding supplement (ENSURE COMPLETE)  237 mL Oral Q1500  . feeding supplement (RESOURCE BREEZE)  1 Container Oral BID  . midodrine  10 mg Oral TID  WC  . pantoprazole  40 mg Oral QAC breakfast   Continuous Infusions: . lactated ringers Stopped (08/12/14 1800)   PRN Meds:.acetaminophen, bisacodyl **OR** bisacodyl, ondansetron **OR** ondansetron (ZOFRAN) IV, sodium chloride, traMADol  Xrays No results found.  Assessment/Plan: S/P Procedure(s) (LRB): CORONARY ARTERY BYPASS GRAFTING (CABG) (N/A) TRANSESOPHAGEAL ECHOCARDIOGRAM (TEE) (N/A)  1 my understanding is an appeal was made for SNF placement and required a pier to pier review. I see no evidence that this occurred. Patients brother [name] told me yesterday there is no family member who can stay with patient. If that has changed he could possibly go home. If not he should remain in hospital .    LOS: 21 days    GOLD,[name] 08/17/2014  Patient seen and examined, agree with above. Awaiting final word on SNF. If not an option, his daughter can stay with him a few days

## 2014-08-29 ENCOUNTER — Ambulatory Visit (HOSPITAL_COMMUNITY)
Admission: RE | Admit: 2014-08-29 | Discharge: 2014-08-29 | Disposition: A | Discharge status: Home / Self Care | Payer: Medicare HMO | Source: Ambulatory Visit | Attending: Cardiology | Admitting: Cardiology

## 2014-08-29 VITALS — BP 110/68 | HR 90 | Wt 136.2 lb

## 2014-08-29 DIAGNOSIS — Z720 Tobacco use: Secondary | ICD-10-CM | POA: Diagnosis not present

## 2014-08-29 DIAGNOSIS — I255 Ischemic cardiomyopathy: Secondary | ICD-10-CM | POA: Insufficient documentation

## 2014-08-29 DIAGNOSIS — Z7982 Long term (current) use of aspirin: Secondary | ICD-10-CM | POA: Diagnosis not present

## 2014-08-29 DIAGNOSIS — I5021 Acute systolic (congestive) heart failure: Secondary | ICD-10-CM

## 2014-08-29 DIAGNOSIS — Z79899 Other long term (current) drug therapy: Secondary | ICD-10-CM | POA: Diagnosis not present

## 2014-08-29 DIAGNOSIS — F70 Mild intellectual disabilities: Secondary | ICD-10-CM | POA: Diagnosis not present

## 2014-08-29 DIAGNOSIS — I5022 Chronic systolic (congestive) heart failure: Secondary | ICD-10-CM | POA: Diagnosis not present

## 2014-08-29 DIAGNOSIS — Z951 Presence of aortocoronary bypass graft: Secondary | ICD-10-CM | POA: Diagnosis not present

## 2014-08-29 DIAGNOSIS — Z87891 Personal history of nicotine dependence: Secondary | ICD-10-CM | POA: Diagnosis not present

## 2014-08-29 DIAGNOSIS — I2510 Atherosclerotic heart disease of native coronary artery without angina pectoris: Secondary | ICD-10-CM | POA: Insufficient documentation

## 2014-08-29 LAB — DIGOXIN LEVEL: DIGOXIN LVL: 0.7 ng/mL — AB (ref 0.8–2.0)

## 2014-08-29 NOTE — Patient Instructions (Signed)
Decrease Midodrine to 5 mg Three times a day   Your physician recommends that you schedule a follow-up appointment in: 7-10 days

## 2014-08-30 NOTE — Progress Notes (Signed)
Patient ID: GERY SABEDRA, male   DOB: 12-Aug-1951, 64 y.o.   MRN: [id number] PCP: Dr. Ginette Pitman  64 yo with history of smoking and mild mental retardation presents for followup after recent hospitalization with CABG.  Patient was admitted to [facility] in 12/15 with dyspnea.  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  LHC showed 3 vessel disease with EF 15%.  Echo showed EF 15-20%.  Patient had CABG x 5.  It was difficult to wean him off pressors post-op.  He ended up having to start midodrine.   Currently, he is doing well.  BP is 110/68 today, better than when in hospital.  No exertional dyspnea.  Mild chest soreness but no exertional chest pain.  No lightheadedness or syncope.  He was discharged to SNF.   Labs (12/15): K 4.1, creatinine 0.81, hgb 9.1  PMH: 1. Smoker 2. Mild mental retardation 3. CAD: LHC (12/15) with 3 vessel disease.  CABG x 5 in 12/15 with LIMA-LAD, SVG-D1, sequential SVG-OM2/OM3, SVG-PDA.   4. Ischemic cardiomyopathy: Echo (12/15) with EF 15-20%, wall motion abnormalities, mildly decreased RV systolic function, mild MR.    SH: Lives with mother, quit smoking in 12/15.  No ETOH.   FH: Brother and father with MIs.    ROS: All systems reviewed and negative except as per HPI.   Current Outpatient Prescriptions  Medication Sig Dispense Refill  . aspirin EC 325 MG EC tablet Take 1 tablet (325 mg total) by mouth daily.    Marland Kitchen atorvastatin (LIPITOR) 40 MG tablet Take 1 tablet (40 mg total) by mouth daily at 6 PM. 30 tablet 1  . digoxin (LANOXIN) 0.125 MG tablet Take 1 tablet (0.125 mg total) by mouth daily. 30 tablet 1  . feeding supplement, ENSURE COMPLETE, (ENSURE COMPLETE) LIQD Take 237 mLs by mouth 3 (three) times daily with meals.    . furosemide (LASIX) 40 MG tablet Take 0.5 tablets (20 mg total) by mouth daily. On days the patient is 147 pounds or greater 30 tablet 1  . midodrine (PROAMATINE) 10 MG tablet Take 1 tablet (10 mg total) by mouth 3 (three) times daily with meals. 90 tablet 1   . potassium chloride (K-DUR,KLOR-CON) 10 MEQ tablet Take 1 tablet (10 mEq total) by mouth daily. Take only on days that takes lasix 30 tablet 1  . traMADol (ULTRAM) 50 MG tablet Take 1 tablet (50 mg total) by mouth every 6 (six) hours as needed for severe pain. 50 tablet 0   No current facility-administered medications for this encounter.   BP 110/68 mmHg  Pulse 90  Wt 136 lb 4 oz (61.803 kg)  SpO2 99% General: NAD Neck: No JVD, no thyromegaly or thyroid nodule.  Lungs: Clear to auscultation bilaterally with normal respiratory effort. CV: Nondisplaced PMI.  Heart regular S1/S2, no S3/S4, no murmur.  No peripheral edema.  No carotid bruit.  Normal pedal pulses.  Abdomen: Soft, nontender, no hepatosplenomegaly, no distention.  Skin: Intact without lesions or rashes.  Neurologic: Alert and oriented x 3.  Psych: Normal affect. Extremities: No clubbing or cyanosis.  HEENT: Normal.   Assessment/Plan: 1. CAD: Stable s/p CABG. Doing well without exertional chest pain. Continue ASA, statin.  He will need lipids in 2 months.  2. Chronic systolic CHF: Ischemic CMP, EF 15-20% by echo.  He had a difficult time weaning off pressors and is now on midodrine.  BP is higher now.  He is not volume overloaded.   - Decrease midodrine to 5  mg tid.  At next appointment, will try to stop it.  - Continue digoxin, check level today.  - Repeat echo in 3 months, if EF remains low will need ICD.  3. Smoking: He has quit smoking since CABG. I congratulated him.   [name] 08/30/2014

## 2014-09-08 ENCOUNTER — Encounter (HOSPITAL_COMMUNITY): Payer: Medicare HMO

## 2014-09-08 ENCOUNTER — Encounter: Payer: Medicare HMO | Admitting: Cardiovascular Disease

## 2014-09-11 ENCOUNTER — Other Ambulatory Visit: Payer: Self-pay | Admitting: Surgery

## 2014-09-11 DIAGNOSIS — Z951 Presence of aortocoronary bypass graft: Secondary | ICD-10-CM

## 2014-09-12 ENCOUNTER — Ambulatory Visit (HOSPITAL_COMMUNITY)
Admission: RE | Admit: 2014-09-12 | Discharge: 2014-09-12 | Disposition: A | Discharge status: Home / Self Care | Payer: Medicare HMO | Source: Ambulatory Visit | Attending: Internal Medicine | Admitting: Internal Medicine

## 2014-09-12 ENCOUNTER — Encounter (HOSPITAL_COMMUNITY): Payer: Self-pay

## 2014-09-12 ENCOUNTER — Encounter (HOSPITAL_COMMUNITY): Payer: Self-pay | Admitting: *Deleted

## 2014-09-12 DIAGNOSIS — Z951 Presence of aortocoronary bypass graft: Secondary | ICD-10-CM | POA: Insufficient documentation

## 2014-09-12 LAB — BASIC METABOLIC PANEL
ANION GAP: 10 (ref 5–15)
BUN: 21 mg/dL (ref 6–23)
CO2: 29 mmol/L (ref 19–32)
CREATININE: 0.96 mg/dL (ref 0.50–1.35)
Calcium: 9.2 mg/dL (ref 8.4–10.5)
Chloride: 98 mEq/L (ref 96–112)
GFR calc non Af Amer: 86 mL/min — ABNORMAL LOW (ref >90)
GLUCOSE: 75 mg/dL (ref 70–99)
POTASSIUM: 3.7 mmol/L (ref 3.5–5.1)
SODIUM: 137 mmol/L (ref 135–145)

## 2014-09-12 MED ORDER — POTASSIUM CHLORIDE CRYS ER 10 MEQ PO TBCR: 10.0000 meq | EXTENDED_RELEASE_TABLET | ORAL | Status: DC

## 2014-09-12 MED ORDER — FUROSEMIDE 40 MG PO TABS: 20.0000 mg | ORAL_TABLET | ORAL | Status: DC

## 2014-09-12 NOTE — Patient Instructions (Signed)
Change Furosemide and Potassium to every other day   Labs today  Your physician recommends that you schedule a follow-up appointment in: 1 month

## 2014-09-12 NOTE — Progress Notes (Signed)
Patient ID: Carl [name], male   DOB: [birth year], 64 y.o.   MRN: [id number]  PCP: Dr. Ginette Pitman  64 yo with history of smoking and mild mental retardation presents for followup after recent hospitalization with CABG.  Patient was admitted to [facility] in 12/15 with dyspnea.  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  LHC showed 3 vessel disease with EF 15%.  Echo showed EF 15-20%.  Patient had CABG x 5.  It was difficult to wean him off pressors post-op.  He ended up having to start midodrine.   He returns for follow up with his brother. Overall feels ok.  Appetite ok. Denies SOB/CP. Lives with Mom and she helps with his medications. Pills are in bubble pack. Has home health. Not weighing at home.  Not smoking.   Labs (12/15): K 4.1, creatinine 0.81, hgb 9.1  PMH: 1. Smoker 2. Mild mental retardation 3. CAD: LHC (12/15) with 3 vessel disease.  CABG x 5 in 12/15 with LIMA-LAD, SVG-D1, sequential SVG-OM2/OM3, SVG-PDA.   4. Ischemic cardiomyopathy: Echo (12/15) with EF 15-20%, wall motion abnormalities, mildly decreased RV systolic function, mild MR.    SH: Lives with mother, quit smoking in 12/15.  No ETOH.   FH: Brother and father with MIs.    ROS: All systems reviewed and negative except as per HPI.   Current Outpatient Prescriptions  Medication Sig Dispense Refill  . aspirin EC 325 MG EC tablet Take 1 tablet (325 mg total) by mouth daily.    Marland Kitchen atorvastatin (LIPITOR) 40 MG tablet Take 1 tablet (40 mg total) by mouth daily at 6 PM. 30 tablet 1  . digoxin (LANOXIN) 0.125 MG tablet Take 1 tablet (0.125 mg total) by mouth daily. 30 tablet 1  . feeding supplement, ENSURE COMPLETE, (ENSURE COMPLETE) LIQD Take 237 mLs by mouth 3 (three) times daily with meals.    . furosemide (LASIX) 40 MG tablet Take 0.5 tablets (20 mg total) by mouth daily. On days the patient is 147 pounds or greater 30 tablet 1  . midodrine (PROAMATINE) 10 MG tablet Take 1 tablet (10 mg total) by mouth 3 (three) times daily with meals. 90  tablet 1  . potassium chloride (K-DUR,KLOR-CON) 10 MEQ tablet Take 1 tablet (10 mEq total) by mouth daily. Take only on days that takes lasix 30 tablet 1  . traMADol (ULTRAM) 50 MG tablet Take 1 tablet (50 mg total) by mouth every 6 (six) hours as needed for severe pain. 50 tablet 0   No current facility-administered medications for this encounter.   BP 86/54 mmHg  Pulse 84  Wt 140 lb 12.8 oz (63.866 kg)  SpO2 99% General: NAD Brother present Neck: No JVD, no thyromegaly or thyroid nodule.  Lungs: Clear to auscultation bilaterally with normal respiratory effort. CV: Nondisplaced PMI.  Heart regular S1/S2, no S3/S4, no murmur.  No peripheral edema.  No carotid bruit.  Normal pedal pulses.  Abdomen: Soft, nontender, no hepatosplenomegaly, no distention.  Skin: Intact without lesions or rashes.  Neurologic: Alert and oriented x 3.  Psych: Normal affect. Extremities: No clubbing or cyanosis.  HEENT: Normal.   Assessment/Plan: 1. CAD: Stable s/p CABG. Doing well without exertional chest pain. Continue ASA, statin.  He will need lipids in 1 month.  2. Chronic systolic CHF: Ischemic CMP, EF 15-20% by echo.  He had a difficult time weaning off pressors and is now on midodrine.   BPs soft. Appears dry. Change lasix and potassium to every other day.  Not on BB /Ace due to soft BP. -   - Continue digoxin, dig level 0.7   Continue midodrine 5 mg tid for now due to soft BP.  We will need to keep his medication regimen and easy as possible. I have asked him weigh and record daily and return with weight log.  - Repeat echo in 2 months, if EF remains low will need ICD.  3. Smoking: He has quit smoking since CABG. I congratulated him.   Follow up in 4 weeks.   CLEGG,AMY NP-C  09/12/2014  Patient seen and examined with Darrick Grinder, NP. We discussed all aspects of the encounter. I agree with the assessment and plan as stated above.   Improving slowly since CABG but BP still soft. He looks dry. Will  cut back diuretics. Continue midodrine for now. Unable to add on ACE or b-blocker at this point due to low BP. Agree with repeating echo in 2 months to look for recovery of EF.   Daniel Bensimhon,MD 11:04 AM

## 2014-09-13 ENCOUNTER — Ambulatory Visit (INDEPENDENT_AMBULATORY_CARE_PROVIDER_SITE_OTHER): Payer: Self-pay | Admitting: Surgery

## 2014-09-13 ENCOUNTER — Ambulatory Visit
Admission: RE | Admit: 2014-09-13 | Discharge: 2014-09-13 | Disposition: A | Discharge status: Home / Self Care | Payer: Medicare HMO | Source: Ambulatory Visit | Attending: Surgery | Admitting: Surgery

## 2014-09-13 ENCOUNTER — Encounter: Payer: Self-pay | Admitting: Surgery

## 2014-09-13 ENCOUNTER — Encounter (INDEPENDENT_AMBULATORY_CARE_PROVIDER_SITE_OTHER): Payer: Self-pay

## 2014-09-13 VITALS — BP 92/61 | HR 76 | Resp 20 | Ht 72.0 in | Wt 140.0 lb

## 2014-09-13 DIAGNOSIS — Z951 Presence of aortocoronary bypass graft: Secondary | ICD-10-CM

## 2014-09-13 NOTE — Progress Notes (Signed)
HPI:  Patient returns for routine postoperative follow-up having undergone CABG x 5 on 08/03/2014. The patient's early postoperative recovery while in the hospital was notable for a slow postop course due to low blood pressure requiring vasopressor support for over a week. He was eventually started on midodrine. A follow up echo in the hospital showed no significant improvement in his LVEF at 15-20%. He was seen by the Advanced Heart Failure Team. Since hospital discharge the patient reports that he feels ok. He has had no chest pain or shortness of breath. He denies edema. His brother is with him today and feels that he is doing well at home with his mother. He saw Darrick Grinder, NP-C in the heart failure clinic yesterday and his lasix and potassium were decreased to every other day because his BP was  low and he appeared dry .   Current Outpatient Prescriptions  Medication Sig Dispense Refill  . aspirin EC 325 MG EC tablet Take 1 tablet (325 mg total) by mouth daily.    Marland Kitchen atorvastatin (LIPITOR) 40 MG tablet Take 1 tablet (40 mg total) by mouth daily at 6 PM. 30 tablet 1  . digoxin (LANOXIN) 0.125 MG tablet Take 1 tablet (0.125 mg total) by mouth daily. 30 tablet 1  . feeding supplement, ENSURE COMPLETE, (ENSURE COMPLETE) LIQD Take 237 mLs by mouth 3 (three) times daily with meals.    . furosemide (LASIX) 40 MG tablet Take 0.5 tablets (20 mg total) by mouth every other day. On days the patient is 147 pounds or greater 30 tablet 1  . midodrine (PROAMATINE) 10 MG tablet Take 1 tablet (10 mg total) by mouth 3 (three) times daily with meals. 90 tablet 1  . potassium chloride (K-DUR,KLOR-CON) 10 MEQ tablet Take 1 tablet (10 mEq total) by mouth every other day. Take only on days that takes lasix 30 tablet 1  . traMADol (ULTRAM) 50 MG tablet Take 1 tablet (50 mg total) by mouth every 6 (six) hours as needed for severe pain. 50 tablet 0   No current facility-administered medications for this visit.      Physical Exam: BP 92/61 mmHg  Pulse 76  Resp 20  Ht 6' (1.829 m)  Wt 140 lb (63.504 kg)  BMI 18.98 kg/m2  SpO2 98% He looks well Lungs are clear Cardiac exam shows a regular rate and rhythm with normal heart sounds. The chest incision is healing well and the sternum is stable. The leg incisions are healing well and there is no significant edema.   Diagnostic Tests:  CLINICAL DATA: CABG.  EXAM: CHEST 2 VIEW  COMPARISON: 08/24/2014.  FINDINGS: Interim removal of left chest tube. Mediastinum and hilar structures are normal. Cardiomegaly with normal pulmonary vascularity. Interim clearing of pulmonary interstitial edema and pleural effusion No focal pulmonary infiltrate. No pleural effusion or pneumothorax. Bilateral pleural parenchymal thickening noted consistent with scarring. No acute bony abnormality . Pill fragment noted in the upper quadrant on the left .  IMPRESSION: 1. Interim removal of left central line. Prior CABG. Cardiomegaly. Normal pulmonary vascularity. Interim clearing of congestive heart failure. 2. Pleural parenchymal scarring. No acute pulmonary disease.   Electronically Signed  By: Marcello Moores Register  On: 09/13/2014 10:24  Impression/Plan:  Overall I think he is doing well. I encouraged him to continue walking. He has not been smoking since his surgery which is great. I asked him not to lift anything heavier than 10 lbs for 3 months postop. He is  going to follow up in the heart failure clinic in 4 weeks. He will return to see me if he develops any problems with his incisions.

## 2014-10-10 ENCOUNTER — Ambulatory Visit (HOSPITAL_COMMUNITY)
Admission: RE | Admit: 2014-10-10 | Discharge: 2014-10-10 | Disposition: A | Discharge status: Home / Self Care | Payer: Medicare Other | Source: Ambulatory Visit | Attending: Cardiology | Admitting: Cardiology

## 2014-10-10 ENCOUNTER — Encounter (HOSPITAL_COMMUNITY): Payer: Medicare HMO

## 2014-10-10 ENCOUNTER — Encounter (HOSPITAL_COMMUNITY): Payer: Self-pay

## 2014-10-10 ENCOUNTER — Ambulatory Visit (HOSPITAL_COMMUNITY)
Admission: RE | Admit: 2014-10-10 | Discharge: 2014-10-10 | Disposition: A | Discharge status: Home / Self Care | Payer: Medicare Other | Source: Ambulatory Visit | Attending: Internal Medicine | Admitting: Internal Medicine

## 2014-10-10 VITALS — BP 110/70 | HR 93 | Wt 148.4 lb

## 2014-10-10 DIAGNOSIS — R05 Cough: Secondary | ICD-10-CM

## 2014-10-10 DIAGNOSIS — J449 Chronic obstructive pulmonary disease, unspecified: Secondary | ICD-10-CM | POA: Insufficient documentation

## 2014-10-10 DIAGNOSIS — I5021 Acute systolic (congestive) heart failure: Secondary | ICD-10-CM

## 2014-10-10 DIAGNOSIS — Z87891 Personal history of nicotine dependence: Secondary | ICD-10-CM | POA: Insufficient documentation

## 2014-10-10 DIAGNOSIS — Z79899 Other long term (current) drug therapy: Secondary | ICD-10-CM | POA: Insufficient documentation

## 2014-10-10 DIAGNOSIS — F70 Mild intellectual disabilities: Secondary | ICD-10-CM | POA: Insufficient documentation

## 2014-10-10 DIAGNOSIS — I5022 Chronic systolic (congestive) heart failure: Secondary | ICD-10-CM | POA: Diagnosis not present

## 2014-10-10 DIAGNOSIS — I255 Ischemic cardiomyopathy: Secondary | ICD-10-CM | POA: Diagnosis not present

## 2014-10-10 DIAGNOSIS — Z7982 Long term (current) use of aspirin: Secondary | ICD-10-CM | POA: Insufficient documentation

## 2014-10-10 DIAGNOSIS — Z951 Presence of aortocoronary bypass graft: Secondary | ICD-10-CM | POA: Insufficient documentation

## 2014-10-10 DIAGNOSIS — I2510 Atherosclerotic heart disease of native coronary artery without angina pectoris: Secondary | ICD-10-CM | POA: Insufficient documentation

## 2014-10-10 DIAGNOSIS — R059 Cough, unspecified: Secondary | ICD-10-CM

## 2014-10-10 DIAGNOSIS — I5023 Acute on chronic systolic (congestive) heart failure: Secondary | ICD-10-CM | POA: Insufficient documentation

## 2014-10-10 MED ORDER — MIDODRINE HCL 10 MG PO TABS: 5.0000 mg | ORAL_TABLET | Freq: Three times a day (TID) | ORAL | Status: DC

## 2014-10-10 NOTE — Progress Notes (Signed)
Patient ID: Carl [name], male   DOB: [birth year], 64 y.o.   MRN: [id number] PCP: Dr. Ginette Pitman  64 yo with history of smoking and mild mental retardation presents for followup after recent hospitalization with CABG.  Patient was admitted to [facility] in 12/15 with dyspnea.  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  LHC showed 3 vessel disease with EF 15%.  Echo showed EF 15-20%.  Patient had CABG x 5.  It was difficult to wean him off pressors post-op.  He ended up having to start midodrine.   He returns for follow up with his brother. Overall feels ok.  Evaluated by PCP on February 10th for cough. Has cough during the day and at night. Started on amoxicillin 2/10 for 7 days. Weight at home 144 pounds. Lives with Mom and she helps with his medications. Pills are in bubble pack. Appetite ok.Not smoking.   Labs (12/15): K 4.1, creatinine 0.81, hgb 9.1 Labs (09/12/2014): K 3.7 Creatinine 0.96, digoxin 0.7  PMH: 1. Smoker 2. Mild mental retardation 3. CAD: LHC (12/15) with 3 vessel disease.  CABG x 5 in 12/15 with LIMA-LAD, SVG-D1, sequential SVG-OM2/OM3, SVG-PDA.   4. Ischemic cardiomyopathy: Echo (12/15) with EF 15-20%, wall motion abnormalities, mildly decreased RV systolic function, mild MR.    SH: Lives with mother, quit smoking in 12/15.  No ETOH.   FH: Brother and father with MIs.    ROS: All systems reviewed and negative except as per HPI.   Current Outpatient Prescriptions  Medication Sig Dispense Refill  . amoxicillin (AMOXIL) 875 MG tablet Take 875 mg by mouth 2 (two) times daily. Completes 10/11/2014    . aspirin EC 325 MG EC tablet Take 1 tablet (325 mg total) by mouth daily.    Marland Kitchen atorvastatin (LIPITOR) 40 MG tablet Take 1 tablet (40 mg total) by mouth daily at 6 PM. 30 tablet 1  . digoxin (LANOXIN) 0.125 MG tablet Take 1 tablet (0.125 mg total) by mouth daily. 30 tablet 1  . feeding supplement, ENSURE COMPLETE, (ENSURE COMPLETE) LIQD Take 237 mLs by mouth 3 (three) times daily with meals.    .  furosemide (LASIX) 40 MG tablet Take 0.5 tablets (20 mg total) by mouth every other day. On days the patient is 147 pounds or greater 30 tablet 1  . midodrine (PROAMATINE) 10 MG tablet Take 1 tablet (10 mg total) by mouth 3 (three) times daily with meals. 90 tablet 1  . potassium chloride (K-DUR,KLOR-CON) 10 MEQ tablet Take 1 tablet (10 mEq total) by mouth every other day. Take only on days that takes lasix 30 tablet 1  . traMADol (ULTRAM) 50 MG tablet Take 1 tablet (50 mg total) by mouth every 6 (six) hours as needed for severe pain. 50 tablet 0   No current facility-administered medications for this encounter.   BP 110/70 mmHg  Pulse 93  Wt 148 lb 6.4 oz (67.314 kg)  SpO2 98% General: NAD Brother present Neck: No JVD, no thyromegaly or thyroid nodule.  Lungs: Clear to auscultation bilaterally with normal respiratory effort. CV: Nondisplaced PMI.  Heart regular S1/S2, no S3/S4, no murmur.  No peripheral edema.  No carotid bruit.  Normal pedal pulses.  Abdomen: Soft, nontender, no hepatosplenomegaly, no distention.  Skin: Intact without lesions or rashes.  Neurologic: Alert and oriented x 3.  Psych: Normal affect. Extremities: No clubbing or cyanosis.  HEENT: Normal.   Assessment/Plan: 1. CAD: Stable s/p CABG. Doing well without exertional chest pain. Continue ASA,  statin.   - Arrange cardiac rehab at [facility].  2. Chronic systolic CHF: Ischemic CMP, EF 15-20% by echo.  He had a difficult time weaning off pressors and is now on midodrine.  BP seems more stable now.  - Not on BB /Ace due to soft BP.  - Continue digoxin, dig level 0.7   - Cut back midodrine to 5 mg tid  - We will need to keep his medication regimen and easy as possible. I have asked him weigh and record daily and return with weight log.  - Repeat echo in 2 months, if EF remains low will need ICD.  3. Smoking: He has quit smoking since CABG. 4.Cough - on amoxicillin twice a day and will complete 10/11/14. Check CXR today.    Follow up in 4 weeks with an ECHO.   CLEGG,AMY NP-C  10/10/2014   Patient seen with NP, agree with the above note.  CXR from today reviewed, it showed no significant abnormality.  As above, plan to slowly titrated down on midodrine, decrease to 5 mg tid today.  Echo in 2 months for ICD.  Cardiac rehab at [facility].  Followup in 1 month to try to stop midodrine.   [name] 10/10/2014

## 2014-10-10 NOTE — Patient Instructions (Signed)
Decrease Midodrine to 5 mg (1/2 tab) Three times a day   Chest x-ray today in Radiology  Your physician recommends that you schedule a follow-up appointment in: 4 weeks

## 2014-10-17 ENCOUNTER — Ambulatory Visit: Payer: Self-pay | Admitting: Internal Medicine

## 2014-10-30 ENCOUNTER — Ambulatory Visit: Payer: Self-pay | Admitting: Gastroenterology

## 2014-11-07 ENCOUNTER — Ambulatory Visit (HOSPITAL_BASED_OUTPATIENT_CLINIC_OR_DEPARTMENT_OTHER)
Admission: RE | Admit: 2014-11-07 | Discharge: 2014-11-07 | Disposition: A | Discharge status: Home / Self Care | Payer: Medicare Other | Source: Ambulatory Visit | Attending: Internal Medicine | Admitting: Internal Medicine

## 2014-11-07 ENCOUNTER — Ambulatory Visit (HOSPITAL_COMMUNITY)
Admission: RE | Admit: 2014-11-07 | Discharge: 2014-11-07 | Disposition: A | Discharge status: Home / Self Care | Payer: Medicare Other | Source: Ambulatory Visit | Attending: Internal Medicine | Admitting: Internal Medicine

## 2014-11-07 DIAGNOSIS — Z7982 Long term (current) use of aspirin: Secondary | ICD-10-CM | POA: Diagnosis not present

## 2014-11-07 DIAGNOSIS — I2510 Atherosclerotic heart disease of native coronary artery without angina pectoris: Secondary | ICD-10-CM | POA: Insufficient documentation

## 2014-11-07 DIAGNOSIS — Z951 Presence of aortocoronary bypass graft: Secondary | ICD-10-CM | POA: Insufficient documentation

## 2014-11-07 DIAGNOSIS — E785 Hyperlipidemia, unspecified: Secondary | ICD-10-CM | POA: Insufficient documentation

## 2014-11-07 DIAGNOSIS — F329 Major depressive disorder, single episode, unspecified: Secondary | ICD-10-CM | POA: Diagnosis not present

## 2014-11-07 DIAGNOSIS — I5021 Acute systolic (congestive) heart failure: Secondary | ICD-10-CM | POA: Insufficient documentation

## 2014-11-07 DIAGNOSIS — I509 Heart failure, unspecified: Secondary | ICD-10-CM

## 2014-11-07 DIAGNOSIS — Z87891 Personal history of nicotine dependence: Secondary | ICD-10-CM | POA: Insufficient documentation

## 2014-11-07 DIAGNOSIS — I5022 Chronic systolic (congestive) heart failure: Secondary | ICD-10-CM

## 2014-11-07 LAB — LIPID PANEL
CHOL/HDL RATIO: 2.7 ratio
Cholesterol: 109 mg/dL (ref 0–200)
HDL: 40 mg/dL (ref >39)
LDL Cholesterol: 62 mg/dL (ref 0–99)
TRIGLYCERIDES: 34 mg/dL (ref <150)
VLDL: 7 mg/dL (ref 0–40)

## 2014-11-07 LAB — BASIC METABOLIC PANEL
Anion gap: 7 (ref 5–15)
BUN: 23 mg/dL (ref 6–23)
CO2: 28 mmol/L (ref 19–32)
CREATININE: 0.92 mg/dL (ref 0.50–1.35)
Calcium: 8.9 mg/dL (ref 8.4–10.5)
Chloride: 103 mmol/L (ref 96–112)
GFR calc Af Amer: >90 mL/min (ref >90)
GFR calc non Af Amer: 88 mL/min — ABNORMAL LOW (ref >90)
GLUCOSE: 106 mg/dL — AB (ref 70–99)
Potassium: 3.7 mmol/L (ref 3.5–5.1)
Sodium: 138 mmol/L (ref 135–145)

## 2014-11-07 LAB — DIGOXIN LEVEL: Digoxin Level: 0.8 ng/mL (ref 0.8–2.0)

## 2014-11-07 LAB — BRAIN NATRIURETIC PEPTIDE: B NATRIURETIC PEPTIDE 5: 484.3 pg/mL — AB (ref 0.0–100.0)

## 2014-11-07 MED ORDER — CITALOPRAM HYDROBROMIDE 20 MG PO TABS: 20.0000 mg | ORAL_TABLET | Freq: Every day | ORAL | Status: DC

## 2014-11-07 NOTE — Progress Notes (Signed)
Patient ID: Carl [name], male   DOB: 09/29/1950, 64 y.o.   MRN: [id number] PCP: Dr. Ginette Pitman  64 yo with history of smoking and mild mental retardation presents for followup after recent hospitalization with CABG.  Patient was admitted to [facility] in 12/15 with dyspnea.  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  LHC showed 3 vessel disease with EF 15%.  Echo showed EF 15-20%.  Patient had CABG x 5.  It was difficult to wean him off pressors post-op.  He ended up having to start midodrine.  This was tapered down at last appointment.   He returns for followup with his brother.  He denies chest pain or exertional dyspnea.  No lightheadedness, syncope, or falls. Per his brother, he is quieter and less interactive. Brother thinks he is depressed.  Echo today showed EF 30-35% (improved).  Weight is up 2 lbs.   Labs (12/15): K 4.1, creatinine 0.81, hgb 9.1 Labs (09/12/2014): K 3.7 Creatinine 0.96, digoxin 0.7  PMH: 1. Smoker 2. Mild mental retardation 3. CAD: LHC (12/15) with 3 vessel disease.  CABG x 5 in 12/15 with LIMA-LAD, SVG-D1, sequential SVG-OM2/OM3, SVG-PDA.   4. Ischemic cardiomyopathy: Echo (12/15) with EF 15-20%, wall motion abnormalities, mildly decreased RV systolic function, mild MR.  Echo (3/16) with EF 30-35%, severe LV dilation, moderate MR, PA systolic pressure 42 mmHg.  5. Depression  SH: Lives with mother, quit smoking in 12/15.  No ETOH.   FH: Brother and father with MIs.    ROS: All systems reviewed and negative except as per HPI.   Current Outpatient Prescriptions  Medication Sig Dispense Refill  . aspirin EC 325 MG EC tablet Take 1 tablet (325 mg total) by mouth daily.    Marland Kitchen atorvastatin (LIPITOR) 40 MG tablet Take 1 tablet (40 mg total) by mouth daily at 6 PM. 30 tablet 1  . digoxin (LANOXIN) 0.125 MG tablet Take 1 tablet (0.125 mg total) by mouth daily. 30 tablet 1  . feeding supplement, ENSURE COMPLETE, (ENSURE COMPLETE) LIQD Take 237 mLs by mouth 3 (three) times daily with meals.     . furosemide (LASIX) 40 MG tablet Take 0.5 tablets (20 mg total) by mouth every other day. On days the patient is 147 pounds or greater 30 tablet 1  . potassium chloride (K-DUR,KLOR-CON) 10 MEQ tablet Take 1 tablet (10 mEq total) by mouth every other day. Take only on days that takes lasix 30 tablet 1  . traMADol (ULTRAM) 50 MG tablet Take 1 tablet (50 mg total) by mouth every 6 (six) hours as needed for severe pain. 50 tablet 0  . citalopram (CELEXA) 20 MG tablet Take 1 tablet (20 mg total) by mouth daily. 30 tablet 6   No current facility-administered medications for this encounter.   BP 102/60 mmHg  Pulse 90  Wt 150 lb (68.04 kg)  SpO2 98% General: NAD Brother present Neck: No JVD, no thyromegaly or thyroid nodule.  Lungs: Clear to auscultation bilaterally with normal respiratory effort. CV: Nondisplaced PMI.  Heart regular S1/S2, no S3/S4, no murmur.  No peripheral edema.  No carotid bruit.  Normal pedal pulses.  Abdomen: Soft, nontender, no hepatosplenomegaly, no distention.  Skin: Intact without lesions or rashes.  Neurologic: Alert and oriented x 3.  Psych: Flat affect. Extremities: No clubbing or cyanosis.  HEENT: Normal.   Assessment/Plan: 1. CAD: Stable s/p CABG. Doing well without exertional chest pain. Continue ASA, statin.   - Will again try to arrange cardiac rehab  at [facility].  2. Chronic systolic CHF: Ischemic CMP, EF 15-20% by echo in 12/15 but improved to 30-35% on today's echo.  He had a difficult time weaning off pressors and is now on midodrine.  BP seems more stable now. Narrow QRS, not CRT candidate.  - Not on beta blocker/ACEI due to   - Continue digoxin, check level. - Continue Lasix 20 mg every other day.  Check BMET/BNP today.  - Stop midodrine today.  - EF remains below 35% but there appears to have been significant improvement.  Rather than heading right to ICD, will repeat echo in 4 months to look for further improvement.  3. Smoking: He has quit smoking  since CABG. 4. Depression: Flat affect, less interactive per brother.  Suspect depression.  I will start him on Celexa.  5. Hyperlipidemia: Check lipids today, continue statin.    Follow up in 4 weeks .   [name]  11/07/2014

## 2014-11-07 NOTE — Patient Instructions (Signed)
STOP Midodrine  START Celexa (1 tablet daily)  Your provider requests you have an echo in 4 months.  LABS today.  FOLLOW UP in 1 month.

## 2014-11-07 NOTE — Progress Notes (Signed)
  Echocardiogram 2D Echocardiogram has been performed.  Simone Rodenbeck 11/07/2014, 10:45 AM

## 2014-12-04 ENCOUNTER — Encounter
Admit: 2014-12-04 | Disposition: A | Discharge status: Home / Self Care | Payer: Self-pay | Attending: Cardiology | Admitting: Cardiology

## 2014-12-05 ENCOUNTER — Ambulatory Visit (HOSPITAL_COMMUNITY)
Admission: RE | Admit: 2014-12-05 | Discharge: 2014-12-05 | Disposition: A | Discharge status: Home / Self Care | Payer: Medicare Other | Source: Ambulatory Visit | Attending: Internal Medicine | Admitting: Internal Medicine

## 2014-12-05 ENCOUNTER — Encounter (HOSPITAL_COMMUNITY): Payer: Self-pay

## 2014-12-05 VITALS — BP 104/58 | HR 80 | Wt 151.5 lb

## 2014-12-05 DIAGNOSIS — E785 Hyperlipidemia, unspecified: Secondary | ICD-10-CM | POA: Insufficient documentation

## 2014-12-05 DIAGNOSIS — Z7982 Long term (current) use of aspirin: Secondary | ICD-10-CM | POA: Insufficient documentation

## 2014-12-05 DIAGNOSIS — I5022 Chronic systolic (congestive) heart failure: Secondary | ICD-10-CM | POA: Diagnosis not present

## 2014-12-05 DIAGNOSIS — Z79899 Other long term (current) drug therapy: Secondary | ICD-10-CM | POA: Insufficient documentation

## 2014-12-05 DIAGNOSIS — Z87891 Personal history of nicotine dependence: Secondary | ICD-10-CM | POA: Insufficient documentation

## 2014-12-05 DIAGNOSIS — F329 Major depressive disorder, single episode, unspecified: Secondary | ICD-10-CM | POA: Insufficient documentation

## 2014-12-05 DIAGNOSIS — I2510 Atherosclerotic heart disease of native coronary artery without angina pectoris: Secondary | ICD-10-CM | POA: Diagnosis not present

## 2014-12-05 DIAGNOSIS — F70 Mild intellectual disabilities: Secondary | ICD-10-CM | POA: Diagnosis not present

## 2014-12-05 DIAGNOSIS — Z951 Presence of aortocoronary bypass graft: Secondary | ICD-10-CM | POA: Insufficient documentation

## 2014-12-05 DIAGNOSIS — I255 Ischemic cardiomyopathy: Secondary | ICD-10-CM | POA: Diagnosis not present

## 2014-12-05 DIAGNOSIS — Z8249 Family history of ischemic heart disease and other diseases of the circulatory system: Secondary | ICD-10-CM | POA: Diagnosis not present

## 2014-12-05 LAB — BASIC METABOLIC PANEL
Anion gap: 8 (ref 5–15)
BUN: 23 mg/dL (ref 6–23)
CALCIUM: 9.3 mg/dL (ref 8.4–10.5)
CO2: 30 mmol/L (ref 19–32)
Chloride: 102 mmol/L (ref 96–112)
Creatinine, Ser: 1.25 mg/dL (ref 0.50–1.35)
GFR calc Af Amer: 69 mL/min — ABNORMAL LOW (ref >90)
GFR calc non Af Amer: 60 mL/min — ABNORMAL LOW (ref >90)
GLUCOSE: 173 mg/dL — AB (ref 70–99)
Potassium: 3.7 mmol/L (ref 3.5–5.1)
Sodium: 140 mmol/L (ref 135–145)

## 2014-12-05 MED ORDER — CARVEDILOL 3.125 MG PO TABS: 3.1250 mg | ORAL_TABLET | Freq: Two times a day (BID) | ORAL | Status: DC

## 2014-12-05 NOTE — Patient Instructions (Addendum)
START Carvedilol 3.125mg  (1 tablet) twice daily with food. Rx sent electronically to [facility] in [place].  Follow up 4 weeks.  Do the following things EVERYDAY: 1) Weigh yourself in the morning before breakfast. Write it down and keep it in a log. 2) Take your medicines as prescribed 3) Eat low salt foods-Limit salt (sodium) to 2000 mg per day.  4) Stay as active as you can everyday 5) Limit all fluids for the day to less than 2 liters

## 2014-12-05 NOTE — Addendum Note (Signed)
Encounter addended by: Effie Berkshire, RN on: 12/05/2014 10:19 AM<BR>     Documentation filed: Patient Instructions Section, Dx Association, Orders

## 2014-12-05 NOTE — Progress Notes (Signed)
Patient ID: Carl [name], male   DOB: [birth year], 64 y.o.   MRN: [id number] PCP: Dr. Ginette Pitman  64 yo with history of smoking and mild mental retardation Patient was admitted to [facility] in 12/15 with dyspnea.  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  LHC showed 3 vessel disease with EF 15%.  Echo showed EF 15-20%.  Patient had CABG x 5.  It was difficult to wean him off pressors post-op.  He ended up having to start midodrine.  This was weaned off last visit.  He returns for followup with his brother.  He is difficult historian. He denies chest pain or exertional dyspnea.  No lightheadedness, syncope, or falls. Trying to get him to afternoon class at [facility] [employer].    Echo 3/16 EF 30-35% with moderate MR.  Labs (12/15): K 4.1, creatinine 0.81, hgb 9.1 Labs (09/12/2014): K 3.7 Creatinine 0.96, digoxin 0.7  PMH: 1. Smoker 2. Mild mental retardation 3. CAD: LHC (12/15) with 3 vessel disease.  CABG x 5 in 12/15 with LIMA-LAD, SVG-D1, sequential SVG-OM2/OM3, SVG-PDA.   4. Ischemic cardiomyopathy: Echo (12/15) with EF 15-20%, wall motion abnormalities, mildly decreased RV systolic function, mild MR.  Echo (3/16) with EF 30-35%, severe LV dilation, moderate MR, PA systolic pressure 42 mmHg.  5. Depression  SH: Lives with mother, quit smoking in 12/15.  No ETOH.   FH: Brother and father with MIs.    ROS: All systems reviewed and negative except as per HPI.   Current Outpatient Prescriptions  Medication Sig Dispense Refill  . aspirin EC 325 MG EC tablet Take 1 tablet (325 mg total) by mouth daily.    Marland Kitchen atorvastatin (LIPITOR) 40 MG tablet Take 1 tablet (40 mg total) by mouth daily at 6 PM. 30 tablet 1  . citalopram (CELEXA) 20 MG tablet Take 1 tablet (20 mg total) by mouth daily. 30 tablet 6  . digoxin (LANOXIN) 0.125 MG tablet Take 1 tablet (0.125 mg total) by mouth daily. 30 tablet 1  . furosemide (LASIX) 40 MG tablet Take 0.5 tablets (20 mg total) by mouth every other day. On days the patient is 147  pounds or greater 30 tablet 1  . omeprazole (PRILOSEC) 20 MG capsule Take 20 mg by mouth 2 (two) times daily.    . potassium chloride (K-DUR,KLOR-CON) 10 MEQ tablet Take 1 tablet (10 mEq total) by mouth every other day. Take only on days that takes lasix 30 tablet 1  . traMADol (ULTRAM) 50 MG tablet Take 1 tablet (50 mg total) by mouth every 6 (six) hours as needed for severe pain. 50 tablet 0  . feeding supplement, ENSURE COMPLETE, (ENSURE COMPLETE) LIQD Take 237 mLs by mouth 3 (three) times daily with meals. (Patient not taking: Reported on 12/05/2014)     No current facility-administered medications for this encounter.   BP 104/58 mmHg  Pulse 80  Wt 151 lb 8 oz (68.72 kg)  SpO2 99% General: NAD Brother present Neck: No JVD, no thyromegaly or thyroid nodule.  Lungs: Clear to auscultation bilaterally with normal respiratory effort. CV: Nondisplaced PMI.  Heart regular S1/S2, no S3/S4, no murmur.  No peripheral edema.  No carotid bruit.  Normal pedal pulses.  Abdomen: Soft, nontender, no hepatosplenomegaly, no distention.  Skin: Intact without lesions or rashes.  Neurologic: Alert and oriented x 3.  Psych: Flat affect. Extremities: No clubbing or cyanosis.  HEENT: Normal.   Assessment/Plan: 1. CAD: Stable s/p CABG. Doing well without exertional chest pain. Continue  ASA, statin.   - Pending cardiac rehab at [facility].  2. Chronic systolic CHF: Ischemic CMP, EF 15-20% by echo in 12/15 but improved to 30-35% on 3/16 echo.  - NYHA II-III. Volume status stable.  BP seems more stable now - off midodrine. Narrow QRS, not CRT candidate.  - Will attempt to start low-dose carvedilol at 3.125 bid and see how he tolerates. At next visit consider switching lasix to spiro 12.5 daily. (I don't want to make things to o complicated for him all at once) - Continue digoxin, - Continue Lasix 20 mg every other day.   - EF remains below 35% but there appears to have been significant improvement.  Rather than  heading right to ICD, will repeat echo in July to look for further improvement.  3. Smoking: He has quit smoking since CABG. 4. Depression: On Celexa.  5. Hyperlipidemia: Lipids followed by PCP. Continue statin.    Follow up in 4 weeks .   [name]  12/05/2014

## 2014-12-07 ENCOUNTER — Ambulatory Visit
Admit: 2014-12-07 | Disposition: A | Discharge status: Home / Self Care | Payer: Self-pay | Attending: Gastroenterology | Admitting: Gastroenterology

## 2014-12-16 NOTE — H&P (Signed)
PATIENT NAME:  Carl [name], Carl [name] MR#:  [id number] DATE OF BIRTH:  [birth year]  DATE OF ADMISSION:  07/26/2014  PRIMARY CARE PHYSICIAN: [name], MD    CHIEF COMPLAINT: Back pain and shortness of breath.   HISTORY OF PRESENT ILLNESS: This is a 64 year old male who presents to the hospital brought in by his mother and sister due to back pain and shortness of breath. The patient says that he has been having tearing back pain radiating up to his shoulders and bilateral arms now for about 2 weeks. The patient saw his primary care physician, Dr. Ginette Pitman, who got a CT scan of his chest just before Thanksgiving where he was noted to have cardiomegaly and mild CHF. He was also suspected to have possible pneumonia, and therefore was started on Levaquin and given tramadol for his back pain. Despite taking the tramadol and also taking Levaquin his symptoms have not improved. He continues to be short of breath and continues to have significant pain. He therefore came to the ER for further evaluation. In the Emergency Room, the patient was noted to have mild ST changes in his lateral leads, in leads V3 and V4. Also had cardiac markers that were consistent with a non-ST-elevation MI. The patient had a troponin of 24. Hospitalist services were contacted for further treatment and evaluation. The patient denies any acute chest pain, does admit to shortness of breath which is at rest and even with exertion, but no nausea, no vomiting. Does admit to some diaphoresis. No abdominal pain. No diarrhea. No other associated symptoms. 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   PAST MEDICAL HISTORY: Consistent with a long history of tobacco abuse, mild mental retardation.   ALLERGIES: No known drug allergies.   SOCIAL HISTORY: Used to be a smoker, quit about a couple of months back, does have a 30 to 40 pack year smoking history. No alcohol abuse. No illicit drug abuse. Lives at  home with his mother. The patient's mother is alive. She has a history of coronary artery bypass graft surgery and coronary artery disease. Father died from complications of pneumonia and an MI.  CURRENT MEDICATIONS: As follows: Lasix 40 mg daily, Levaquin 500 mg daily, potassium 10 mEq daily, tramadol 50 mg b.i.d.   PHYSICAL EXAMINATION: Presently is as follows:  VITAL SIGNS: Noted to be temperature is 98.6, pulse 102, respirations 18, blood pressure 115/91, saturation is 98% on room air.  GENERAL: He is a pleasant-appearing male, no apparent distress.  HEENT: Atraumatic, normocephalic. Extraocular muscles are intact. Pupils equal and reactive to light. Sclerae anicteric. No conjunctival injection. No pharyngeal erythema.  NECK: Supple. There is no jugular venous distention. No bruits, no lymphadenopathy, no thyromegaly.  HEART: Regular rate and rhythm. He does have a systolic ejection murmur heard at the base, but no rubs, no clicks.  LUNGS: Clear to auscultation bilaterally. No rales or rhonchi. No wheezes.  ABDOMEN: Soft, flat, nontender, nondistended. Has good bowel sounds. No hepatosplenomegaly appreciated.  EXTREMITIES: No evidence of cyanosis, clubbing or peripheral edema. He has +2 pedal and radial pulses bilaterally.  NEUROLOGIC: The patient is alert and oriented x 2. No focal motor or sensory deficits appreciated bilaterally.  SKIN: Moist and warm with no rashes appreciated.  LYMPHATIC: There is no cervical or axillary lymphadenopathy.   LABORATORY DATA: Showed a serum glucose of 107, BNP of 18,673. BUN 25, creatinine 1.4, sodium 136, potassium 4.5, total CK 603, CK-MB 94.3, troponin of  24.2. White cell count 8.1, hemoglobin 15.8, hematocrit 47.4, platelet count of 186,000. INR is 1.1. The patient did have a CT scan of the chest, abdomen and pelvis done with and without contrast which shows no acute aortic findings, no pulmonary embolism , 4 chamber cardiomegaly, large bilateral pleural  effusions, mild interstitial pulmonary edema versus atypical pneumonia, lower lumbar spondylosis and degenerative disk disease. The patient's chest x-ray showed cardiomegaly but no other acute cardiopulmonary disease.   ASSESSMENT AND PLAN: This is a 64 year old male with a long history of tobacco abuse, mild mental retardation, who presents to the hospital having atypical back pain, shortness of breath for 2 weeks. The patient was seen by his primary care physician, had a CT chest as an outpatient before Thanksgiving, noted to have cardiomegaly and possible pneumonia and started on Levaquin, but has not improved, and now presents with a non-ST-elevation myocardial infarction.  1.  Non-ST elevation myocardial infarction. This is likely the cause of the patient's atypical back pain and shortness of breath. I will start the patient on aspirin and heparin nomogram, start him on low-dose beta blocker, statin, give him some nitroglycerin as needed, morphine and oxygen, and we will get a cardiology consult. The patient was supposed to see Dr. Rockey Situ as an outpatient, so I will consult them. He likely will need a cardiac catheterization. I will get a 2-dimensional echocardiogram.  2.  Congestive heart failure. This is acute congestive heart failure, questionable systolic versus diastolic as I do not have an echocardiogram to compare with. I will diurese the patient with IV Lasix, follow I's and O's. Continue him on some beta blocker. We will consider starting an ACE inhibitor as tolerated. We will get a 2-dimensional echocardiogram and also consult cardiology as mentioned. 3.  Acute renal failure. I do not have a baseline creatinine for the patient to compare with. Questionable if this is probably related to his mild congestive heart failure. I will diurese the patient with IV Lasix, follow his BUN and creatinine.   CODE STATUS: The patient is a full code.   TIME SPENT ON ADMISSION: Fifty minutes.     ____________________________ Belia Heman. Verdell Carmine, MD vjs:TT D: 07/26/2014 19:39:43 ET T: 07/26/2014 20:01:35 ET JOB#: [id number]  cc: Belia Heman. Verdell Carmine, MD, <Dictator> Henreitta Leber MD ELECTRONICALLY SIGNED 07/29/2014 15:57

## 2014-12-16 NOTE — Discharge Summary (Signed)
PATIENT NAME:  Carl, [name] MR#:  [id number] DATE OF BIRTH:  10-16-1950  DATE OF ADMISSION:  07/26/2014 DATE OF DISCHARGE:    DIAGNOSES AT TIME OF DISCHARGE: 1. Acute myocardial infarction.  2. Severe left ventricular dysfunction secondary ischemic cardiomyopathy.  3. Cardiac catheterization showing evidence for severe 3 vessel disease with moderate mitral regurgitation and an ejection fraction of 15%.  4. Chronic tobacco use.   CHIEF COMPLAINT: Back pain, shortness of breath.   HISTORY OF PRESENT ILLNESS: Carl [name] is a 64 year old male who was brought to the hospital by his mother and sister because of back pain and shortness of breath. The patient reports pain radiating to his shoulders and both arms for about 2 weeks.  The patient did have a CT of his chest a couple of weeks back which showed evidence of cardiomegaly and CHF.  The patient has been having ongoing shortness of breath with significant pain and reported to the ER and EKG showed evidence of ST-T changes in the lateral leads, V3 and V4 and elevated troponin consistent with an MI.    PAST MEDICAL HISTORY: Significant for tobacco use disorder and mild mental retardation.  The patient admits to 30 to 40 pack-year of smoking and quit smoking a couple of months back.  Please see H and P for full details.   HOSPITAL COURSE:  The patient was admitted to [facility] and cardiac enzymes elevated with total CK of 603, MB was 94.3, 66.1 and 46.8 respectively. Initial troponin was 24.2. Repeat troponins were 21 and 18 respectively. BNP was 18,673. Serum creatinine on admission was 1.48, glucose 107, sodium 136, potassium 4.5, chloride 100 and hemoglobin was 15.8. WBC count 8.1 and platelet count of 186,000.   The patient was seen in consultation by Dr. Fletcher Anon- cardiologist. He also underwent CT angiogram of the chest, abdomen and pelvis which showed no acute aortic findings. No pulmonary embolism was identified.  There was 4 chamber cardiomegaly and  large bilateral pleural effusions, suspected mild interstitial edema versus atypical pneumonia and lower lumbar spondylosis and degenerative disk disease. Chest x-ray showed mild interstitial prominence without convincing pulmonary edema.  There were small bilateral pleural effusions and bibasilar atelectasis. No segmental infiltrate.   The patient underwent cardiac catheterization and also echocardiogram.  Echocardiogram showed EF 15 to 20% with severely decreased global LV systolic function and pseudonormal pattern of LV diastolic filling, and moderately dilated left atrium, mildly dilated right atrium, moderate pleural effusion in the left lateral region, treated pericardial effusion with mild thickening of the anterior and posterior mitral valve leaflets, moderate mitral valve regurgitation and mildly increased LV internal cavity size with mild aortic valve sclerosis without stenosis.  Cardiac catheterization showed evidence of significant 3 vessel disease and an ejection fraction of 15% with moderate mitral regurgitation. Discussed case with Dr. Fletcher Anon - Cardiologist who recommended that he be transferred to [facility] for further management.    The patient was discharged on IV heparin, furosemide injection 40 mg q.12, nitroglycerin 0.4 mg sublingual p.r.n., atorvastatin 40 mg once a day, Metoprolol 25mg  po bid,morphine injection 1 to 2 mg q.4 p.r.n., aspirin 325 mg a day and Tylenol as needed.   The patient family was informed of his condition. The patient was transferred in stable condition to CCU at [facility].   Total time spent in discharge of this patient; 45  minutes    ____________________________ [name], MD vh:by D: 07/27/2014 13:07:47 ET T: 07/27/2014 13:14:08 ET JOB#: [id number]  cc: [name], MD, <Dictator> [name] MD ELECTRONICALLY SIGNED 08/15/2014 13:53

## 2014-12-16 NOTE — Consult Note (Signed)
Brief Consult Note: Diagnosis: anteroseptal infarct with late presentation, possible ischemic MR.   Patient was seen by consultant.   Consult note dictated.   Discussed with Attending MD.   Comments: will review echo.  recommend cardiac cath today.  Electronic Signatures: Kathlyn Sacramento (MD)  (Signed 03-Dec-15 10:40)  Authored: Brief Consult Note   Last Updated: 03-Dec-15 10:40 by Kathlyn Sacramento (MD)

## 2014-12-18 LAB — SURGICAL PATHOLOGY

## 2014-12-20 NOTE — Consult Note (Signed)
PATIENT NAME:  Carl [name], Carl [name] MR#:  [id number] DATE OF BIRTH:  Sep 24, 1950  DATE OF CONSULTATION:  07/27/2014  REFERRING PHYSICIAN:  Vivek J. Verdell Carmine, MD  REASON FOR CONSULTATION: Myocardial infarction.   HISTORY OF PRESENT ILLNESS: This is a 64 year old male with no previous cardiac history. He has known history of tobacco use, and mild mental retardation. He has strong family history of premature coronary artery disease. He has not been feeling well for about 2-3 weeks. He started having back discomfort radiating to both arms associated with dry cough and shortness of breath. He saw Dr. Ginette Pitman, his primary care physician. CT scan was done just before Thanksgiving and he was noted to have mild cardiomegaly and heart failure. There was possibility of pneumonia and he was started on Levaquin with no improvement in symptoms. Given worsened symptoms, he came to the Emergency Room yesterday and was found to have a troponin of 24. EKG showed anteroseptal ST elevation with lateral T wave changes and large anterior Q waves.   PAST MEDICAL HISTORY:  Tobacco use and mild mental retardation.   ALLERGIES: No known drug allergies.   HOME MEDICATIONS INCLUDE: Lasix 40 mg daily, Levaquin 500 mg once daily, potassium 10 mEq once daily and tramadol 50 mg twice daily.   SOCIAL HISTORY: He quit smoking a few months ago. He used to smoke 1 pack per day for at least 30 or 40 years. He denies any alcohol or recreational drug use. He lives at home with his mother.   FAMILY HISTORY: Remarkable for premature coronary artery disease. His father died of complications of myocardial infarction in his 29s. His brother had multiple cardiac stents.   REVIEW OF SYSTEMS: A 10-point review of systems was performed. It is negative other than what is mentioned in the HPI.   PHYSICAL EXAMINATION:  GENERAL: The patient is pleasant and alert, in no acute distress.  VITAL SIGNS: Temperature 98.1, pulse 85, respiratory rate 18, blood  pressure is 92/66, and oxygen saturation is 98% on room air.  HEENT: Normocephalic, atraumatic.  NECK: No JVD or carotid bruits.  RESPIRATORY: Normal respiratory effort with no use of accessory muscles. Auscultation reveals normal breath sounds.  CARDIOVASCULAR: Normal PMI. Normal S1 and S2 with no gallops or murmurs.  ABDOMEN: Benign, nontender, and nondistended.  EXTREMITIES: No clubbing, cyanosis, or edema.  SKIN: Warm and dry with no rash.  PSYCHIATRIC: He is alert and oriented x 3 with normal mood and affect.   LABORATORY AND DIAGNOSTIC, DATA: His creatinine was 1.48 and increased to 1.6. BUN was 25. BNP was 18,000. Troponin initially was 24 and decreased to 18. CK-MB was 94. CBC was unremarkable. EKG showed sinus rhythm with anteroseptal ST elevation with Q waves and lateral T wave inversion.   IMPRESSION: 1.  Anteroseptal ST elevation myocardial infarction with late presentation.  2.  Suspected ischemic cardiomyopathy.  3.  Suspected ischemic mitral regurgitation.  4.  Tobacco use.   RECOMMENDATIONS: The patient's presentation is consistent with anteroseptal myocardial infarction with late presentation. His symptoms started at least 2 weeks ago. His troponin was already 24 on presentation and has trended down since then. EKG shows large anteroseptal Q waves with persistent ST elevation. By physical exam, he has a holosystolic murmur at the apex consistent with mitral regurgitation. I am going to review his echocardiogram. Continue anticoagulation with heparin. I had a prolonged discussion with the patient and family about further recommendations. I recommend proceeding with cardiac catheterization and possible coronary intervention. I  will avoid left ventricular angiography to decrease contrast load. Further recommendations to follow after cardiac catheterization.    ____________________________ Mertie Clause Fletcher Anon, MD maa:DT D: 07/27/2014 10:53:27 ET T: 07/27/2014 11:17:39  ET JOB#: [id number]  cc: Echo Propp A. Fletcher Anon, MD, <Dictator> Wellington Hampshire MD ELECTRONICALLY SIGNED 08/25/2014 9:34

## 2014-12-25 ENCOUNTER — Encounter: Payer: Medicare Other | Attending: Cardiology

## 2014-12-25 DIAGNOSIS — Z951 Presence of aortocoronary bypass graft: Secondary | ICD-10-CM | POA: Insufficient documentation

## 2014-12-25 NOTE — Progress Notes (Signed)
Daily Session Note  Patient Details  Name: Carl [name] MRN: [id number] Date of Birth: 08-14-1951 Referring Provider:  [name], MD  Encounter Date: 12/25/2014  Check In:     Session Check In - 12/25/14 1726    Check-In   Staff Present Lestine Box BS, ACSM EP-C, Exercise Physiologist  Heath Lark RN, BSN, CCRP, Gerlene Burdock RN, BSN   ER physicians immediately available to respond to emergencies See telemetry face sheet for immediately available ER MD   Warm-up and Cool-down Performed on first and last piece of equipment   VAD Patient? No   Pain Assessment   Currently in Pain? No/denies         Goals Met:  Proper associated with RPD/PD & O2 Sat Exercise tolerated well  Goals Unmet:  Not Applicable  Goals Comments:    Dr. Emily Filbert is Medical Director for [place] and [facility].

## 2014-12-27 ENCOUNTER — Encounter: Payer: Medicare Other | Admitting: *Deleted

## 2014-12-27 DIAGNOSIS — Z951 Presence of aortocoronary bypass graft: Secondary | ICD-10-CM | POA: Diagnosis not present

## 2014-12-27 NOTE — Progress Notes (Signed)
Daily Session Note  Patient Details  Name: SHAQUEL CHAVOUS MRN: [id number] Date of Birth: 1951-06-20 Referring Provider:  [name], MD  Encounter Date: 12/27/2014  Check In:     Session Check In - 12/27/14 1707    Check-In   Staff Present Crecencio Mc, RN, BSN; Roanna Epley, RN, BSN; Candiss Norse, MS, ACSM CEP, Exercise Physiologist   ER physicians immediately available to respond to emergencies See telemetry face sheet for immediately available ER MD   Warm-up and Cool-down Performed on first and last piece of equipment   VAD Patient? No   Pain Assessment   Currently in Pain? No/denies           Exercise Prescription Changes - 12/27/14 1700    Resistance Training   Training Prescription Yes   Weight 2   Reps 10-12      Goals Met:  Exercise tolerated well Personal goals reviewed No cardiac symptoms reported  Goals Unmet:  Not Applicable  Goals Comments:    Dr. Emily Filbert is Medical Director for [place] and [facility].

## 2014-12-28 DIAGNOSIS — Z951 Presence of aortocoronary bypass graft: Secondary | ICD-10-CM | POA: Diagnosis not present

## 2014-12-28 NOTE — Progress Notes (Signed)
Daily Session Note  Patient Details  Name: Carl [name] MRN: [id number] Date of Birth: Sep 09, 1950 Referring Provider:  [name], MD  Encounter Date: 12/28/2014  Check In:     Session Check In - 12/28/14 1624    Check-In   Staff Present Lestine Box BS, ACSM EP-C, Exercise Physiologist;Carroll Enterkin RN, BSN;Diane Joya Gaskins RN, BSN   ER physicians immediately available to respond to emergencies See telemetry face sheet for immediately available ER MD   Warm-up and Cool-down Performed on first and last piece of equipment   VAD Patient? No   Pain Assessment   Currently in Pain? No/denies           Exercise Prescription Changes - 12/27/14 1700    Resistance Training   Training Prescription Yes   Weight 2   Reps 10-12      Goals Met:  Proper associated with RPD/PD & O2 Sat Exercise tolerated well  Goals Unmet:  Not Applicable  Goals Comments:     Dr. Emily Filbert is Medical Director for [place] and [facility].

## 2014-12-29 DIAGNOSIS — H9190 Unspecified hearing loss, unspecified ear: Secondary | ICD-10-CM | POA: Insufficient documentation

## 2014-12-29 NOTE — Progress Notes (Addendum)
Cardiac Individual Treatment Plan  Patient Details  Name: Carl [name] MRN: [id number] Date of Birth: 24-Aug-1951 Referring Provider:  [name], MD  Initial Encounter Date:    Patient's Home Medications on Admission:  Current outpatient prescriptions:  .  aspirin EC 325 MG EC tablet, Take 1 tablet (325 mg total) by mouth daily., Disp: , Rfl:  .  atorvastatin (LIPITOR) 40 MG tablet, Take 1 tablet (40 mg total) by mouth daily at 6 PM., Disp: 30 tablet, Rfl: 1 .  digoxin (LANOXIN) 0.125 MG tablet, Take 1 tablet (0.125 mg total) by mouth daily., Disp: 30 tablet, Rfl: 1 .  furosemide (LASIX) 40 MG tablet, Take 0.5 tablets (20 mg total) by mouth every other day. On days the patient is 147 pounds or greater, Disp: 30 tablet, Rfl: 1 .  midodrine (PROAMATINE) 10 MG tablet, Take by mouth., Disp: , Rfl:  .  omeprazole (PRILOSEC) 20 MG capsule, Take 20 mg by mouth 2 (two) times daily., Disp: , Rfl:  .  potassium chloride (K-DUR,KLOR-CON) 10 MEQ tablet, Take 1 tablet (10 mEq total) by mouth every other day. Take only on days that takes lasix, Disp: 30 tablet, Rfl: 1 .  traMADol (ULTRAM) 50 MG tablet, Take 1 tablet (50 mg total) by mouth every 6 (six) hours as needed for severe pain., Disp: 50 tablet, Rfl: 0  Past Medical History: Past Medical History  Diagnosis Date  . Psoriasis   . Hearing loss   . Paronychia   . Vitiligo   . PUD (peptic ulcer disease)   . Leg fracture, left   . Paronychia   . Tobacco abuse     Tobacco Use: History  Smoking status  . Former Smoker  Smokeless tobacco  . Not on file    Labs:     Recent Review Flowsheet Data    Labs for ITP Cardiac and Pulmonary Rehab Latest Ref Rng 08/12/2014 08/13/2014 08/14/2014 08/15/2014 11/07/2014   Cholestrol 0 - 200 mg/dL - - - - 109   LDLCALC 0 - 99 mg/dL - - - - 62   HDL >39 mg/dL - - - - 40   Trlycerides <150 mg/dL - - - - 34   O2SAT - 64.8 59.3 80.0 64.5 -       Exercise Target Goals:    Exercise  Program Goal: Individual exercise prescription set with THRR, safety & activity barriers. Participant demonstrates ability to understand and report RPE using BORG scale, to self-measure pulse accurately, and to acknowledge the importance of the exercise prescription.  Exercise Prescription Goal: Starting with aerobic activity 30 plus minutes a day, 3 days per week for initial exercise prescription. Provide home exercise prescription and guidelines that participant acknowledges understanding prior to discharge.  Activity Barriers & Risk Stratification:   6 Minute Walk:   Initial Exercise Prescription:   Exercise Prescription Changes:      Exercise Prescription Changes      12/27/14 1700           Resistance Training   Training Prescription Yes       Weight 2       Reps 10-12          Discharge Exercise Prescription:   Nutrition:  Target Goals: Understanding of nutrition guidelines, daily intake of sodium 1500mg , cholesterol 200mg , calories 30% from fat and 7% or less from saturated fats, daily to have 5 or more servings of fruits and vegetables.  Biometrics:    Nutrition Therapy Plan and Nutrition  Goals:   Nutrition Discharge: Rate Your Plate Scores:   Nutrition Goals Re-Evaluation:   Psychosocial: Target Goals: Acknowledge presence or absence of depression, maximize coping skills, provide positive support system. Participant is able to verbalize types and ability to use techniques and skills needed for reducing stress and depression.  Initial Review & Psychosocial Screening:   Quality of Life Scores:   PHQ-9: Recent Review Flowsheet Data    There is no flowsheet data to display.      Psychosocial Evaluation and Intervention:   Psychosocial Re-Evaluation:   Vocational Rehabilitation: Provide vocational rehab assistance to qualifying candidates.   Vocational Rehab Evaluation & Intervention:   Education: Education Goals: Education classes  will be provided on a weekly basis, covering required topics. Participant will state understanding/return demonstration of topics presented.  Learning Barriers/Preferences:   Education Topics: General Nutrition Guidelines/Fats and Fiber: -Group instruction provided by verbal, written material, models and posters to present the general guidelines for heart healthy nutrition. Gives an explanation and review of dietary fats and fiber.   Controlling Sodium/Reading Food Labels: -Group verbal and written material supporting the discussion of sodium use in heart healthy nutrition. Review and explanation with models, verbal and written materials for utilization of the food label.   Exercise Physiology & Risk Factors: - Group verbal and written instruction with models to review the exercise physiology of the cardiovascular system and associated critical values. Details cardiovascular disease risk factors and the goals associated with each risk factor.   Aerobic Exercise & Resistance Training: - Gives group verbal and written discussion on the health impact of inactivity. On the components of aerobic and resistive training programs and the benefits of this training and how to safely progress through these programs.   Flexibility, Balance, General Exercise Guidelines: - Provides group verbal and written instruction on the benefits of flexibility and balance training programs. Provides general exercise guidelines with specific guidelines to those with heart or lung disease. Demonstration and skill practice provided.   Stress Management: - Provides group verbal and written instruction about the health risks of elevated stress, cause of high stress, and healthy ways to reduce stress.   Depression: - Provides group verbal and written instruction on the correlation between heart/lung disease and depressed mood, treatment options, and the stigmas associated with seeking treatment.   Anatomy &  Physiology of the Heart: - Group verbal and written instruction and models provide basic cardiac anatomy and physiology, with the coronary electrical and arterial systems. Review of: AMI, Angina, Valve disease, Heart Failure, Cardiac Arrhythmia, Pacemakers, and the ICD.   Cardiac Procedures: - Group verbal and written instruction and models to describe the testing methods done to diagnose heart disease. Reviews the outcomes of the test results. Describes the treatment choices: Medical Management, Angioplasty, or Coronary Bypass Surgery.   Cardiac Medications: - Group verbal and written instruction to review commonly prescribed medications for heart disease. Reviews the medication, class of the drug, and side effects. Includes the steps to properly store meds and maintain the prescription regimen.   Go Sex-Intimacy & Heart Disease, Get SMART - Goal Setting: - Group verbal and written instruction through game format to discuss heart disease and the return to sexual intimacy. Provides group verbal and written material to discuss and apply goal setting through the application of the S.M.A.R.T. Method.   Other Matters of the Heart: - Provides group verbal, written materials and models to describe Heart Failure, Angina, Valve Disease, and Diabetes in the realm of heart disease.  Includes description of the disease process and treatment options available to the cardiac patient.   Exercise & Equipment Safety: - Individual verbal instruction and demonstration of equipment use and safety with use of the equipment.   Infection Prevention: - Provides verbal and written material to individual with discussion of infection control including proper hand washing and proper equipment cleaning during exercise session.   Falls Prevention: - Provides verbal and written material to individual with discussion of falls prevention and safety.   Diabetes: - Individual verbal and written instruction to review  signs/symptoms of diabetes, desired ranges of glucose level fasting, after meals and with exercise. Advice that pre and post exercise glucose checks will be done for 3 sessions at entry of program.    Knowledge Questionnaire Score:   Personal Goals and Risk Factors at Admission:   Personal Goals and Risk Factors Review:    Personal Goals Discharge:     Comments:

## 2014-12-29 NOTE — Addendum Note (Signed)
Addended by: Gerlene Burdock on: 12/29/2014 04:16 PM   Modules accepted: Orders, Medications

## 2014-12-29 NOTE — Addendum Note (Signed)
Addended by: Gerlene Burdock on: 12/29/2014 04:21 PM   Modules accepted: Orders, Medications

## 2015-01-01 ENCOUNTER — Ambulatory Visit (HOSPITAL_COMMUNITY)
Admission: RE | Admit: 2015-01-01 | Discharge: 2015-01-01 | Disposition: A | Discharge status: Home / Self Care | Payer: Medicare Other | Source: Ambulatory Visit | Attending: Cardiology | Admitting: Cardiology

## 2015-01-01 VITALS — BP 92/56 | HR 73 | Wt 157.8 lb

## 2015-01-01 DIAGNOSIS — F329 Major depressive disorder, single episode, unspecified: Secondary | ICD-10-CM | POA: Diagnosis not present

## 2015-01-01 DIAGNOSIS — Z951 Presence of aortocoronary bypass graft: Secondary | ICD-10-CM

## 2015-01-01 DIAGNOSIS — Z87891 Personal history of nicotine dependence: Secondary | ICD-10-CM | POA: Diagnosis not present

## 2015-01-01 DIAGNOSIS — I255 Ischemic cardiomyopathy: Secondary | ICD-10-CM | POA: Diagnosis not present

## 2015-01-01 DIAGNOSIS — I2510 Atherosclerotic heart disease of native coronary artery without angina pectoris: Secondary | ICD-10-CM | POA: Insufficient documentation

## 2015-01-01 DIAGNOSIS — F70 Mild intellectual disabilities: Secondary | ICD-10-CM | POA: Diagnosis not present

## 2015-01-01 DIAGNOSIS — E785 Hyperlipidemia, unspecified: Secondary | ICD-10-CM | POA: Diagnosis not present

## 2015-01-01 DIAGNOSIS — Z79899 Other long term (current) drug therapy: Secondary | ICD-10-CM | POA: Insufficient documentation

## 2015-01-01 DIAGNOSIS — Z7982 Long term (current) use of aspirin: Secondary | ICD-10-CM | POA: Insufficient documentation

## 2015-01-01 DIAGNOSIS — I5022 Chronic systolic (congestive) heart failure: Secondary | ICD-10-CM

## 2015-01-01 LAB — DIGOXIN LEVEL: DIGOXIN LVL: 1 ng/mL (ref 0.8–2.0)

## 2015-01-01 MED ORDER — SPIRONOLACTONE 25 MG PO TABS: 25.0000 mg | ORAL_TABLET | Freq: Every day | ORAL | Status: DC

## 2015-01-01 MED ORDER — SPIRONOLACTONE 25 MG PO TABS: 12.5000 mg | ORAL_TABLET | Freq: Every day | ORAL | Status: DC

## 2015-01-01 NOTE — Patient Instructions (Signed)
Stop Furosemide (Lasxi)  Stop Potassium  Start Spironolactone 12.5 mg (1/2 tab) daily  Lab today  Labs in 2 weeeks  Your physician has requested that you have an echocardiogram. Echocardiography is a painless test that uses sound waves to create images of your heart. It provides your doctor with information about the size and shape of your heart and how well your heart's chambers and valves are working. This procedure takes approximately one hour. There are no restrictions for this procedure.  [place]  Your physician recommends that you schedule a follow-up appointment in: 1 month

## 2015-01-01 NOTE — Progress Notes (Signed)
Daily Session Note  Patient Details  Name: Carl [name] MRN: [id number] Date of Birth: November 29, 1950 Referring Provider:  [name], MD  Encounter Date: 01/01/2015  Check In:     Session Check In - 01/01/15 1631    Check-In   Staff Present Heath Lark RN, BSN, CCRP;Steven Way BS, ACSM EP-C, Exercise Physiologist;Carroll Enterkin RN, BSN   ER physicians immediately available to respond to emergencies See telemetry face sheet for immediately available ER MD   Warm-up and Cool-down Performed on first and last piece of equipment   VAD Patient? No   Pain Assessment   Currently in Pain? No/denies         Goals Met:  Independence with exercise equipment Exercise tolerated well  Goals Unmet:  Not Applicable  Goals Comments: No complaints of cardiac symptoms today.    Dr. Emily Filbert is Medical Director for [place] and [facility].

## 2015-01-01 NOTE — Progress Notes (Signed)
Patient ID: Carl [name], male   DOB: [birth year], 64 y.o.   MRN: [id number] PCP: Dr. Ginette Pitman  64 yo with history of smoking and mild mental retardation Patient was admitted to [facility] in 12/15 with dyspnea.  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  LHC showed 3 vessel disease with EF 15%.  Echo showed EF 15-20%.  Patient had CABG x 5.  It was difficult to wean him off pressors post-op.  He ended up having to start midodrine.  This was weaned off.  He returns for followup with his brother.  He is difficult historian. He denies chest pain or exertional dyspnea.  No lightheadedness, syncope, or falls.  He is now going to cardiac rehab.  He is tolerating Coreg.  Weight is stable.   Echo 3/16 EF 30-35% with moderate MR.  Labs (12/15): K 4.1, creatinine 0.81, hgb 9.1 Labs (09/12/2014): K 3.7 Creatinine 0.96, digoxin 0.7 Labs (3/16): digoxin 0.8, LDL 62, HDL 40 Labs (4/16): K 4.3, creatinine 1.1  PMH: 1. Smoker 2. Mild mental retardation 3. CAD: LHC (12/15) with 3 vessel disease.  CABG x 5 in 12/15 with LIMA-LAD, SVG-D1, sequential SVG-OM2/OM3, SVG-PDA.   4. Ischemic cardiomyopathy: Echo (12/15) with EF 15-20%, wall motion abnormalities, mildly decreased RV systolic function, mild MR.  Echo (3/16) with EF 30-35%, severe LV dilation, moderate MR, PA systolic pressure 42 mmHg.  5. Depression  SH: Lives with mother, quit smoking in 12/15.  No ETOH.   FH: Brother and father with MIs.    ROS: All systems reviewed and negative except as per HPI.   Current Outpatient Prescriptions  Medication Sig Dispense Refill  . aspirin EC 325 MG EC tablet Take 1 tablet (325 mg total) by mouth daily.    Marland Kitchen atorvastatin (LIPITOR) 40 MG tablet Take 1 tablet (40 mg total) by mouth daily at 6 PM. 30 tablet 1  . carvedilol (COREG) 3.125 MG tablet Take 3.125 mg by mouth 2 (two) times daily with a meal.    . digoxin (LANOXIN) 0.125 MG tablet Take 1 tablet (0.125 mg total) by mouth daily. 30 tablet 1  . ferrous sulfate 325 (65 FE)  MG tablet Take 325 mg by mouth daily with breakfast.    . midodrine (PROAMATINE) 10 MG tablet Take by mouth.    Marland Kitchen omeprazole (PRILOSEC) 20 MG capsule Take 20 mg by mouth 2 (two) times daily.    . traMADol (ULTRAM) 50 MG tablet Take 1 tablet (50 mg total) by mouth every 6 (six) hours as needed for severe pain. 50 tablet 0  . spironolactone (ALDACTONE) 25 MG tablet Take 0.5 tablets (12.5 mg total) by mouth daily. 15 tablet 3   No current facility-administered medications for this encounter.   BP 92/56 mmHg  Pulse 73  Wt 157 lb 12.8 oz (71.578 kg)  SpO2 97% General: NAD Brother present Neck: No JVD, no thyromegaly or thyroid nodule.  Lungs: Clear to auscultation bilaterally with normal respiratory effort. CV: Nondisplaced PMI.  Heart regular S1/S2, no S3/S4, no murmur.  No peripheral edema.  No carotid bruit.  Normal pedal pulses.  Abdomen: Soft, nontender, no hepatosplenomegaly, no distention.  Skin: Intact without lesions or rashes.  Neurologic: Alert and oriented x 3.  Psych: Flat affect. Extremities: No clubbing or cyanosis.  HEENT: Normal.   Assessment/Plan: 1. CAD: Stable s/p CABG. Doing well without exertional chest pain. Continue ASA, statin.  He is doing cardiac rehab at [facility].  2. Chronic systolic CHF: Ischemic CMP, EF  15-20% by echo in 12/15 but improved to 30-35% on 3/16 echo. NYHA II. Volume status stable.  BP soft but he does not get lightheaded. Narrow QRS, not CRT candidate.  - Continue Coreg 3.125 mg bid.  - Stop Lasix and KCl, start spironolactone 12.5 mg daily. BMET in 2 wks.  - Continue digoxin, - EF remains below 35% but there appears to have been significant improvement on the 3/16 echo.  Rather than heading right to ICD, will repeat echo in July to look for further improvement.  3. Smoking: He has quit smoking since CABG. 4. Depression: On Celexa.  5. Hyperlipidemia: Good lipids 3/16, continue statin.     Followup 1 month for medication titration.    [name]  01/01/2015

## 2015-01-03 ENCOUNTER — Encounter: Payer: Medicare Other | Admitting: *Deleted

## 2015-01-03 DIAGNOSIS — Z951 Presence of aortocoronary bypass graft: Secondary | ICD-10-CM

## 2015-01-03 NOTE — Progress Notes (Signed)
Daily Session Note  Patient Details  Name: LEALON VANPUTTEN MRN: [id number] Date of Birth: [birth year] Referring Provider:  [name], MD  Encounter Date: 01/03/2015  Check In:     Session Check In - 01/03/15 1607    Check-In   Staff Present Gerlene Burdock RN, BSN;Arietta Eisenstein Dillard Essex MS, ACSM CEP Exercise Physiologist;Diane Mariana Arn, BSN   ER physicians immediately available to respond to emergencies See telemetry face sheet for immediately available ER MD   Medication changes reported     No   Fall or balance concerns reported    No   Warm-up and Cool-down Performed on first and last piece of equipment   VAD Patient? No   Pain Assessment   Currently in Pain? No/denies   Multiple Pain Sites No           Exercise Prescription Changes - 01/03/15 1600    Exercise Review   Progression Yes   REL-XR   Level 4   Watts 50   Minutes 20      Goals Met:  Proper associated with RPD/PD & O2 Sat Independence with exercise equipment Exercise tolerated well Personal goals reviewed No report of cardiac concerns or symptoms  Goals Unmet:  Not Applicable  Goals Comments:No cardiac symptoms reported; daily exercise goals completed    Dr. Emily Filbert is Medical Director for [place] and [facility].

## 2015-01-04 DIAGNOSIS — Z951 Presence of aortocoronary bypass graft: Secondary | ICD-10-CM | POA: Diagnosis not present

## 2015-01-04 NOTE — Progress Notes (Signed)
Daily Session Note  Patient Details  Name: ZABDIEL DRIPPS MRN: [id number] Date of Birth: 10/12/1950 Referring Provider:  [name], MD  Encounter Date: 01/04/2015  Check In:     Session Check In - 01/04/15 1643    Check-In   Staff Present Lestine Box BS, ACSM EP-C, Exercise Physiologist;Carroll Enterkin RN, BSN;Diane Joya Gaskins RN, BSN   ER physicians immediately available to respond to emergencies See telemetry face sheet for immediately available ER MD   Medication changes reported     No   Fall or balance concerns reported    No   Warm-up and Cool-down Performed on first and last piece of equipment   VAD Patient? No   Pain Assessment   Currently in Pain? No/denies         Goals Met:  Proper associated with RPD/PD & O2 Sat Exercise tolerated well No report of cardiac concerns or symptoms Strength training completed today  Goals Unmet:  Not Applicable  Goals Comments:    Dr. Emily Filbert is Medical Director for [place] and [facility].

## 2015-01-08 DIAGNOSIS — Z951 Presence of aortocoronary bypass graft: Secondary | ICD-10-CM | POA: Diagnosis not present

## 2015-01-08 NOTE — Progress Notes (Signed)
Daily Session Note  Patient Details  Name: Carl [name] MRN: [id number] Date of Birth: 02/16/1951 Referring Provider:  [name], MD  Encounter Date: 01/08/2015  Check In:     Session Check In - 01/08/15 1616    Check-In   Staff Present Heath Lark RN, BSN, CCRP;Sera Hitsman BS, ACSM EP-C, Exercise Physiologist;Carroll Radio producer, BSN   ER physicians immediately available to respond to emergencies See telemetry face sheet for immediately available ER MD   Medication changes reported     No   Fall or balance concerns reported    No   Warm-up and Cool-down Performed on first and last piece of equipment   VAD Patient? No   Pain Assessment   Currently in Pain? No/denies         Goals Met:  Proper associated with RPD/PD & O2 Sat Exercise tolerated well No report of cardiac concerns or symptoms Strength training completed today  Goals Unmet:  Not Applicable  Goals Comments:    Dr. Emily Filbert is Medical Director for [place] and [facility].

## 2015-01-10 ENCOUNTER — Encounter: Payer: Medicare Other | Admitting: *Deleted

## 2015-01-10 DIAGNOSIS — Z951 Presence of aortocoronary bypass graft: Secondary | ICD-10-CM

## 2015-01-10 NOTE — Addendum Note (Signed)
Addended by: Candiss Norse D on: 01/10/2015 06:32 PM   Modules accepted: Medications

## 2015-01-10 NOTE — Progress Notes (Signed)
Daily Session Note  Patient Details  Name: Carl [name] MRN: [id number] Date of Birth: 07-03-1951 Referring Provider:  [name], MD  Encounter Date: 01/10/2015  Check In:     Session Check In - 01/10/15 1806    Check-In   Staff Present Candiss Norse MS, ACSM CEP Exercise Physiologist;Carroll Enterkin RN, BSN;Diane Joya Gaskins RN, BSN   ER physicians immediately available to respond to emergencies See telemetry face sheet for immediately available ER MD   Medication changes reported     No   Fall or balance concerns reported    No   Warm-up and Cool-down Performed on first and last piece of equipment   VAD Patient? No   Pain Assessment   Currently in Pain? No/denies   Multiple Pain Sites No           Exercise Prescription Changes - 01/10/15 1800    NuStep   Level 4   Watts 50      Goals Met:  Exercise tolerated well Personal goals reviewed No report of cardiac concerns or symptoms  Goals Unmet:  Not Applicable  Goals Comments:  [name] does not like the XR machine, it hurts his back. He does like the NuStep and Recumbent Bike. He did very well on the RB and increased to Level 8  Dr. Emily Filbert is Medical Director for [place] and [facility].

## 2015-01-11 DIAGNOSIS — Z951 Presence of aortocoronary bypass graft: Secondary | ICD-10-CM

## 2015-01-11 NOTE — Progress Notes (Signed)
Daily Session Note  Patient Details  Name: Carl [name] MRN: [id number] Date of Birth: 12-15-1950 Referring Provider:  [name], MD  Encounter Date: 01/11/2015  Check In:     Session Check In - 01/11/15 1616    Check-In   Staff Present Gerlene Burdock RN, BSN;Diane Joya Gaskins RN, BSN;Nizar Cutler BS, ACSM EP-C, Exercise Physiologist   Medication changes reported     No   Fall or balance concerns reported    No   Warm-up and Cool-down Performed on first and last piece of equipment   VAD Patient? No   Pain Assessment   Currently in Pain? No/denies           Exercise Prescription Changes - 01/10/15 1800    NuStep   Level 4   Watts 50      Goals Met:  Proper associated with RPD/PD & O2 Sat Exercise tolerated well No report of cardiac concerns or symptoms Strength training completed today  Goals Unmet:  Not Applicable  Goals Comments:    Dr. Emily Filbert is Medical Director for [place] and [facility].

## 2015-01-14 ENCOUNTER — Encounter: Payer: Self-pay | Admitting: *Deleted

## 2015-01-14 NOTE — Progress Notes (Signed)
Input data from previous EMR to update the Individualized Treatment Plan.

## 2015-01-15 ENCOUNTER — Other Ambulatory Visit: Payer: Self-pay | Admitting: Cardiology

## 2015-01-15 ENCOUNTER — Encounter: Payer: Self-pay | Admitting: *Deleted

## 2015-01-15 DIAGNOSIS — Z951 Presence of aortocoronary bypass graft: Secondary | ICD-10-CM

## 2015-01-15 NOTE — Progress Notes (Signed)
Cardiac Individual Treatment Plan  Patient Details  Name: Carl [name] MRN: [id number] Date of Birth: 06/11/1951 Referring Provider:  Hande, Vishwanath, MD  Initial Encounter Date:    Patient's Home Medications on Admission:  Current outpatient prescriptions:  .  aspirin EC 325 MG EC tablet, Take 1 tablet (325 mg total) by mouth daily., Disp: , Rfl:  .  atorvastatin (LIPITOR) 40 MG tablet, Take 1 tablet (40 mg total) by mouth daily at 6 PM., Disp: 30 tablet, Rfl: 1 .  carvedilol (COREG) 3.125 MG tablet, Take 3.125 mg by mouth 2 (two) times daily with a meal., Disp: , Rfl:  .  citalopram (CELEXA) 20 MG tablet, Take 20 mg by mouth daily., Disp: , Rfl:  .  digoxin (LANOXIN) 0.125 MG tablet, Take 1 tablet (0.125 mg total) by mouth daily., Disp: 30 tablet, Rfl: 1 .  ferrous sulfate 325 (65 FE) MG tablet, Take 325 mg by mouth daily with breakfast., Disp: , Rfl:  .  midodrine (PROAMATINE) 10 MG tablet, Take by mouth., Disp: , Rfl:  .  omeprazole (PRILOSEC) 20 MG capsule, Take 20 mg by mouth 2 (two) times daily., Disp: , Rfl:  .  spironolactone (ALDACTONE) 25 MG tablet, Take 0.5 tablets (12.5 mg total) by mouth daily., Disp: 15 tablet, Rfl: 3 .  traMADol (ULTRAM) 50 MG tablet, Take 1 tablet (50 mg total) by mouth every 6 (six) hours as needed for severe pain., Disp: 50 tablet, Rfl: 0  Past Medical History: Past Medical History  Diagnosis Date  . Psoriasis   . Hearing loss   . Paronychia   . Vitiligo   . PUD (peptic ulcer disease)   . Leg fracture, left   . Paronychia   . Tobacco abuse     Tobacco Use: History  Smoking status  . Former Smoker  Smokeless tobacco  . Not on file    Labs: Recent Review Flowsheet Data    Labs for ITP Cardiac and Pulmonary Rehab Latest Ref Rng 08/12/2014 08/13/2014 08/14/2014 08/15/2014 11/07/2014   Cholestrol 0 - 200 mg/dL - - - - 109   LDLCALC 0 - 99 mg/dL - - - - 62   HDL >39 mg/dL - - - - 40   Trlycerides <150 mg/dL - - - - 34   O2SAT - 64.8  59.3 80.0 64.5 -       Exercise Target Goals:    Exercise Program Goal: Individual exercise prescription set with THRR, safety & activity barriers. Participant demonstrates ability to understand and report RPE using BORG scale, to self-measure pulse accurately, and to acknowledge the importance of the exercise prescription.  Exercise Prescription Goal: Starting with aerobic activity 30 plus minutes a day, 3 days per week for initial exercise prescription. Provide home exercise prescription and guidelines that participant acknowledges understanding prior to discharge.  Activity Barriers & Risk Stratification:   6 Minute Walk:     6 Minute Walk      12/04/14 1016       6 Minute Walk   Phase Initial     Distance 420 feet     Walk Time 6 minutes     Resting HR 66 bpm     Resting BP 108/72 mmHg     Max Ex. HR 93 bpm     Max Ex. BP 110/74 mmHg     RPE 12     Symptoms Yes (comment)     Comments NO SYMPTOMS   Test done on NUSTEP          Initial Exercise Prescription:   Exercise Prescription Changes:     Exercise Prescription Changes      12/27/14 1700 01/03/15 1600 01/10/15 1800 01/15/15 1100     Exercise Review   Progression  Yes  Yes    Response to Exercise   Blood Pressure (Admit)    108/62 mmHg    Blood Pressure (Exercise)    118/60 mmHg    Blood Pressure (Exit)    110/64 mmHg    Heart Rate (Admit)    92 bpm    Heart Rate (Exercise)    94 bpm    Heart Rate (Exit)    66 bpm    Rating of Perceived Exertion (Exercise)    12    Duration    Progress to 30 minutes of continuous aerobic without signs/symptoms of physical distress    Intensity    Rest + 30    Progression    Continue progressive overload as per policy without signs/symptoms or physical distress.    Resistance Training   Training Prescription Yes   Yes    Weight 2   2    Reps 10-12   10-12    NuStep   Level   4 4    Watts   50 50    Minutes    25    REL-XR   Level  4  3    Watts  50  50     Minutes  20  20       Discharge Exercise Prescription:   Nutrition:  Target Goals: Understanding of nutrition guidelines, daily intake of sodium <1580m, cholesterol <2016m calories 30% from fat and 7% or less from saturated fats, daily to have 5 or more servings of fruits and vegetables.  Biometrics:     Pre Biometrics - 12/04/14 1017    Pre Biometrics   Height 5' 11" (1.803 m)   Weight 151 lb (68.493 kg)   Waist Circumference 34.5 inches   Hip Circumference 36.5 inches   Waist to Hip Ratio 0.95 %   BMI (Calculated) 21.1       Nutrition Therapy Plan and Nutrition Goals:   Nutrition Discharge: Rate Your Plate Scores:   Nutrition Goals Re-Evaluation:     Nutrition Goals Re-Evaluation      01/11/15 1652           Personal Goal #1 Re-Evaluation   Personal Goal #1 His mother cooks for him at times and she tries to make heatlhy meals.        Goal Progress Seen Yes          Psychosocial: Target Goals: Acknowledge presence or absence of depression, maximize coping skills, provide positive support system. Participant is able to verbalize types and ability to use techniques and skills needed for reducing stress and depression.  Initial Review & Psychosocial Screening:   Quality of Life Scores:   PHQ-9:     Recent Review Flowsheet Data    There is no flowsheet data to display.      Psychosocial Evaluation and Intervention:     Psychosocial Evaluation - 01/15/15 1712    Psychosocial Evaluation & Interventions   Comments Counselor met with Mr. BaCichyoday for initial psychosocial evaluation.  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  He reports that he lives with his 8264ear old mother who is in "pretty good condition".  Mr. BaDubuqueppears to have some cognitive disabilities and difficulty  hearing, so was unclear about some of the questions asked of him by this counselor.  He reports that he sleeps well, has a good appetite and  his mood is "okay."  When asked about depression, he denies a history of depression or current symptoms.  However, Mr. Gettis is on Celexa and when questioned, was not sure the purpose of this medication.  He does report that he is on "alot of medications and they make him sleepy."  Counselor will follow up with Mr. Hustead as needed.     Continued Psychosocial Services Needed --  Mr. Jungwirth will benefit from consistent exercise and possibly meeting with the dietician to discuss healthier eating habits, although his 73 year old mother is the cook in this home.      Psychosocial Re-Evaluation:   Vocational Rehabilitation: Provide vocational rehab assistance to qualifying candidates.   Vocational Rehab Evaluation & Intervention:     Vocational Rehab - 01/14/15 1016    Initial Vocational Rehab Evaluation & Intervention   Assessment shows need for Vocational Rehabilitation No      Education: Education Goals: Education classes will be provided on a weekly basis, covering required topics. Participant will state understanding/return demonstration of topics presented.  Learning Barriers/Preferences:     Learning Barriers/Preferences - 01/14/15 1015    Learning Barriers/Preferences   Learning Barriers Inability to learn new things;Reading;Exercise Concerns   Learning Preferences Pictoral      Education Topics: General Nutrition Guidelines/Fats and Fiber: -Group instruction provided by verbal, written material, models and posters to present the general guidelines for heart healthy nutrition. Gives an explanation and review of dietary fats and fiber.   Controlling Sodium/Reading Food Labels: -Group verbal and written material supporting the discussion of sodium use in heart healthy nutrition. Review and explanation with models, verbal and written materials for utilization of the food label.   Exercise Physiology & Risk Factors: - Group verbal and written instruction with models to review  the exercise physiology of the cardiovascular system and associated critical values. Details cardiovascular disease risk factors and the goals associated with each risk factor.   Aerobic Exercise & Resistance Training: - Gives group verbal and written discussion on the health impact of inactivity. On the components of aerobic and resistive training programs and the benefits of this training and how to safely progress through these programs.   Flexibility, Balance, General Exercise Guidelines: - Provides group verbal and written instruction on the benefits of flexibility and balance training programs. Provides general exercise guidelines with specific guidelines to those with heart or lung disease. Demonstration and skill practice provided.          Most Recent Value   Date  01/15/15   Educator  SB   Instruction Review Code  2- meets goals/outcomes      Stress Management: - Provides group verbal and written instruction about the health risks of elevated stress, cause of high stress, and healthy ways to reduce stress.   Depression: - Provides group verbal and written instruction on the correlation between heart/lung disease and depressed mood, treatment options, and the stigmas associated with seeking treatment.   Anatomy & Physiology of the Heart: - Group verbal and written instruction and models provide basic cardiac anatomy and physiology, with the coronary electrical and arterial systems. Review of: AMI, Angina, Valve disease, Heart Failure, Cardiac Arrhythmia, Pacemakers, and the ICD.   Cardiac Procedures: - Group verbal and written instruction and models to describe the testing methods done to diagnose heart  disease. Reviews the outcomes of the test results. Describes the treatment choices: Medical Management, Angioplasty, or Coronary Bypass Surgery.   Cardiac Medications: - Group verbal and written instruction to review commonly prescribed medications for heart disease.  Reviews the medication, class of the drug, and side effects. Includes the steps to properly store meds and maintain the prescription regimen.   Go Sex-Intimacy & Heart Disease, Get SMART - Goal Setting: - Group verbal and written instruction through game format to discuss heart disease and the return to sexual intimacy. Provides group verbal and written material to discuss and apply goal setting through the application of the S.M.A.R.T. Method.   Other Matters of the Heart: - Provides group verbal, written materials and models to describe Heart Failure, Angina, Valve Disease, and Diabetes in the realm of heart disease. Includes description of the disease process and treatment options available to the cardiac patient.   Exercise & Equipment Safety: - Individual verbal instruction and demonstration of equipment use and safety with use of the equipment.   Infection Prevention: - Provides verbal and written material to individual with discussion of infection control including proper hand washing and proper equipment cleaning during exercise session.   Falls Prevention: - Provides verbal and written material to individual with discussion of falls prevention and safety.   Diabetes: - Individual verbal and written instruction to review signs/symptoms of diabetes, desired ranges of glucose level fasting, after meals and with exercise. Advice that pre and post exercise glucose checks will be done for 3 sessions at entry of program.    Knowledge Questionnaire Score:   Personal Goals and Risk Factors at Admission:   Personal Goals and Risk Factors Review:      Goals and Risk Factor Review      01/11/15 1653           Increase Aerobic Exercise and Physical Activity   Goals Progress/Improvement seen  Yes       Comments Is doing well using the XR600 Recumbent Elliptical. He said it is easy now.           Personal Goals Discharge:     Comments: 30 day review. 

## 2015-01-15 NOTE — Progress Notes (Signed)
Daily Session Note  Patient Details  Name: Carl [name] MRN: [id number] Date of Birth: [birth year] Referring Provider:  [name], MD  Encounter Date: 01/15/2015  Check In:     Session Check In - 01/15/15 1629    Check-In   Staff Present Lestine Box BS, ACSM EP-C, Exercise Physiologist;Carroll Enterkin RN, BSN;Susanne Bice RN, BSN, [place]   ER physicians immediately available to respond to emergencies See telemetry face sheet for immediately available ER MD   Medication changes reported     No   Fall or balance concerns reported    No   Warm-up and Cool-down Performed on first and last piece of equipment   VAD Patient? No   Pain Assessment   Currently in Pain? No/denies           Exercise Prescription Changes - 01/15/15 1100    Exercise Review   Progression Yes   Response to Exercise   Blood Pressure (Admit) 108/62 mmHg   Blood Pressure (Exercise) 118/60 mmHg   Blood Pressure (Exit) 110/64 mmHg   Heart Rate (Admit) 92 bpm   Heart Rate (Exercise) 94 bpm   Heart Rate (Exit) 66 bpm   Rating of Perceived Exertion (Exercise) 12   Duration Progress to 30 minutes of continuous aerobic without signs/symptoms of physical distress   Intensity Rest + 30   Progression Continue progressive overload as per policy without signs/symptoms or physical distress.   Resistance Training   Training Prescription Yes   Weight 2   Reps 10-12   NuStep   Level 4   Watts 50   Minutes 25   REL-XR   Level 3   Watts 50   Minutes 20      Goals Met:  Proper associated with RPD/PD & O2 Sat Exercise tolerated well No report of cardiac concerns or symptoms Strength training completed today  Goals Unmet:  Not Applicable  Goals Comments:    Dr. Emily Filbert is Medical Director for [place] and [facility].

## 2015-01-15 NOTE — Progress Notes (Signed)
Daily Session Note  Patient Details  Name: Carl [name] MRN: [id number] Date of Birth: 31-Dec-1950 Referring Provider:  [name], MD  Encounter Date: 01/15/2015  Check In:     Session Check In - 01/15/15 1629    Check-In   Staff Present Lestine Box BS, ACSM EP-C, Exercise Physiologist;Carroll Enterkin RN, BSN;Agron Swiney RN, BSN, [place]   ER physicians immediately available to respond to emergencies See telemetry face sheet for immediately available ER MD   Medication changes reported     No   Fall or balance concerns reported    No   Warm-up and Cool-down Performed on first and last piece of equipment   VAD Patient? No   Pain Assessment   Currently in Pain? No/denies           Exercise Prescription Changes - 01/15/15 1100    Exercise Review   Progression Yes   Response to Exercise   Blood Pressure (Admit) 108/62 mmHg   Blood Pressure (Exercise) 118/60 mmHg   Blood Pressure (Exit) 110/64 mmHg   Heart Rate (Admit) 92 bpm   Heart Rate (Exercise) 94 bpm   Heart Rate (Exit) 66 bpm   Rating of Perceived Exertion (Exercise) 12   Duration Progress to 30 minutes of continuous aerobic without signs/symptoms of physical distress   Intensity Rest + 30   Progression Continue progressive overload as per policy without signs/symptoms or physical distress.   Resistance Training   Training Prescription Yes   Weight 2   Reps 10-12   NuStep   Level 4   Watts 50   Minutes 25   REL-XR   Level 3   Watts 50   Minutes 20      Goals Met:  Exercise tolerated well No report of cardiac concerns or symptoms  Goals Unmet:  Not Applicable  Goals Comments:    Dr. Emily Filbert is Medical Director for [place] and [facility].

## 2015-01-16 LAB — BASIC METABOLIC PANEL
BUN/Creatinine Ratio: 12 (ref 10–22)
BUN: 15 mg/dL (ref 8–27)
CO2: 24 mmol/L (ref 18–29)
CREATININE: 1.24 mg/dL (ref 0.76–1.27)
Calcium: 9.1 mg/dL (ref 8.6–10.2)
Chloride: 102 mmol/L (ref 97–108)
GFR calc non Af Amer: 61 mL/min/{1.73_m2} (ref >59)
GFR, EST AFRICAN AMERICAN: 71 mL/min/{1.73_m2} (ref >59)
GLUCOSE: 124 mg/dL — AB (ref 65–99)
Potassium: 4.6 mmol/L (ref 3.5–5.2)
Sodium: 143 mmol/L (ref 134–144)

## 2015-01-17 ENCOUNTER — Encounter: Payer: Medicare Other | Admitting: *Deleted

## 2015-01-17 DIAGNOSIS — Z951 Presence of aortocoronary bypass graft: Secondary | ICD-10-CM

## 2015-01-17 NOTE — Progress Notes (Signed)
Daily Session Note  Patient Details  Name: Carl [name] MRN: [id number] Date of Birth: [birth year] Referring Provider:  [name], MD  Encounter Date: 01/17/2015  Check In:     Session Check In - 01/17/15 1632    Check-In   Staff Present Candiss Norse MS, ACSM CEP Exercise Physiologist;Lexton Hidalgo Joya Gaskins RN, BSN;Carroll Enterkin RN, BSN   ER physicians immediately available to respond to emergencies See telemetry face sheet for immediately available ER MD   Medication changes reported     No   Fall or balance concerns reported    No   Warm-up and Cool-down Performed on first and last piece of equipment   VAD Patient? No   Pain Assessment   Currently in Pain? No/denies   Multiple Pain Sites No         Goals Met:  Exercise tolerated well No report of cardiac concerns or symptoms  Goals Unmet:  Not Applicable  Goals Comments:    Dr. Emily Filbert is Medical Director for [place] and [facility].

## 2015-01-18 ENCOUNTER — Other Ambulatory Visit: Payer: Self-pay | Admitting: *Deleted

## 2015-01-18 DIAGNOSIS — Z951 Presence of aortocoronary bypass graft: Secondary | ICD-10-CM

## 2015-01-18 NOTE — Progress Notes (Signed)
Daily Session Note  Patient Details  Name: Carl [name] MRN: [id number] Date of Birth: [birth year] Referring Provider:  [name], MD  Encounter Date: 01/18/2015  Check In:     Session Check In - 01/18/15 1646    Check-In   Staff Present Gerlene Burdock RN, BSN;Diane Joya Gaskins RN, BSN;Hanz Winterhalter BS, ACSM EP-C, Exercise Physiologist   ER physicians immediately available to respond to emergencies See telemetry face sheet for immediately available ER MD   Medication changes reported     No   Fall or balance concerns reported    No   Warm-up and Cool-down Performed on first and last piece of equipment   VAD Patient? No   Pain Assessment   Currently in Pain? No/denies         Goals Met:  Proper associated with RPD/PD & O2 Sat Exercise tolerated well No report of cardiac concerns or symptoms Strength training completed today  Goals Unmet:  Not Applicable  Goals Comments:    Dr. Emily Filbert is Medical Director for [facility] and [facility].

## 2015-01-24 ENCOUNTER — Encounter: Payer: Medicare Other | Attending: Cardiology | Admitting: *Deleted

## 2015-01-24 DIAGNOSIS — Z951 Presence of aortocoronary bypass graft: Secondary | ICD-10-CM | POA: Insufficient documentation

## 2015-01-24 NOTE — Progress Notes (Signed)
Daily Session Note  Patient Details  Name: Carl [name] MRN: [id number] Date of Birth: 04-06-1951 Referring Provider:  [name], MD  Encounter Date: 01/24/2015  Check In:     Session Check In - 01/24/15 1618    Check-In   Staff Present Candiss Norse MS, ACSM CEP Exercise Physiologist;Carroll Enterkin RN, BSN;Diane Joya Gaskins RN, BSN   ER physicians immediately available to respond to emergencies See telemetry face sheet for immediately available ER MD   Medication changes reported     No   Fall or balance concerns reported    No   Warm-up and Cool-down Performed on first and last piece of equipment   VAD Patient? No   Pain Assessment   Currently in Pain? No/denies   Multiple Pain Sites No           Exercise Prescription Changes - 01/24/15 1600    Recumbant Bike   Level 4   RPM 60   Watts 35   Minutes 20   NuStep   Level 4   Watts 50   Minutes 25   REL-XR   Level 3   Watts 50   Minutes 20      Goals Met:  Independence with exercise equipment Exercise tolerated well Personal goals reviewed No report of cardiac concerns or symptoms Strength training completed today  Goals Unmet:  Not Applicable  Goals Comments:    Dr. Emily Filbert is Medical Director for [place] [facility].

## 2015-01-25 ENCOUNTER — Encounter: Payer: Medicare Other | Admitting: *Deleted

## 2015-01-25 DIAGNOSIS — Z951 Presence of aortocoronary bypass graft: Secondary | ICD-10-CM | POA: Diagnosis not present

## 2015-01-25 NOTE — Progress Notes (Signed)
Daily Session Note ° °Patient Details  °Name: Delta L Magouirk °MRN: [id number] °Date of Birth: 02/06/1951 °Referring Provider:  Hande, Vishwanath, MD ° °Encounter Date: 01/25/2015 ° °Check In: °  °  °Session Check In - 01/25/15 1807   ° Check-In  ° Staff Present Mary Jo Abernethy RN;Susanne Bice RN, BSN, CCRP;Diane Wright RN, BSN  ° ER physicians immediately available to respond to emergencies See telemetry face sheet for immediately available ER MD  ° Medication changes reported     No  ° Fall or balance concerns reported    No  ° Warm-up and Cool-down Performed on first and last piece of equipment  ° VAD Patient? No  ° Pain Assessment  ° Currently in Pain? No/denies  ° Multiple Pain Sites No  °  ° ° ° ° ° °Goals Met:  °Exercise tolerated well °No report of cardiac concerns or symptoms °Strength training completed today ° °Goals Unmet:  °Not Applicable ° °Goals Comments:  ° ° °Dr. Mark Miller is Medical Director for [facility] and [facility]. °

## 2015-01-29 ENCOUNTER — Encounter (HOSPITAL_COMMUNITY): Payer: Self-pay

## 2015-01-29 ENCOUNTER — Ambulatory Visit (HOSPITAL_COMMUNITY)
Admission: RE | Admit: 2015-01-29 | Discharge: 2015-01-29 | Disposition: A | Discharge status: Home / Self Care | Payer: Medicare Other | Source: Ambulatory Visit | Attending: Internal Medicine | Admitting: Internal Medicine

## 2015-01-29 VITALS — BP 94/62 | HR 82 | Wt 162.8 lb

## 2015-01-29 DIAGNOSIS — I2510 Atherosclerotic heart disease of native coronary artery without angina pectoris: Secondary | ICD-10-CM | POA: Diagnosis not present

## 2015-01-29 DIAGNOSIS — F329 Major depressive disorder, single episode, unspecified: Secondary | ICD-10-CM | POA: Insufficient documentation

## 2015-01-29 DIAGNOSIS — Z87891 Personal history of nicotine dependence: Secondary | ICD-10-CM | POA: Diagnosis not present

## 2015-01-29 DIAGNOSIS — I255 Ischemic cardiomyopathy: Secondary | ICD-10-CM | POA: Insufficient documentation

## 2015-01-29 DIAGNOSIS — I5022 Chronic systolic (congestive) heart failure: Secondary | ICD-10-CM

## 2015-01-29 DIAGNOSIS — Z79899 Other long term (current) drug therapy: Secondary | ICD-10-CM | POA: Insufficient documentation

## 2015-01-29 DIAGNOSIS — F70 Mild intellectual disabilities: Secondary | ICD-10-CM | POA: Diagnosis not present

## 2015-01-29 DIAGNOSIS — E785 Hyperlipidemia, unspecified: Secondary | ICD-10-CM | POA: Insufficient documentation

## 2015-01-29 DIAGNOSIS — Z7982 Long term (current) use of aspirin: Secondary | ICD-10-CM | POA: Diagnosis not present

## 2015-01-29 DIAGNOSIS — Z951 Presence of aortocoronary bypass graft: Secondary | ICD-10-CM

## 2015-01-29 NOTE — Patient Instructions (Signed)
Your physician recommends that you schedule a follow-up appointment in: 4-6 weeks  

## 2015-01-29 NOTE — Progress Notes (Signed)
Advanced Heart Failure Medication Review by a Pharmacist  Does the patient  feel that his/her medications are working for him/her?  yes  Has the patient been experiencing any side effects to the medications prescribed?  Yes - hypotension  Does the patient measure his/her own blood pressure or blood glucose at home?  no   Does the patient have any problems obtaining medications due to transportation or finances?   no  Understanding of regimen: fair Understanding of indications: fair Potential of compliance: good    Pharmacist comments: Patient presents to heart failure clinic with his mother and his medications were reviewed with a pharmacist. Patient has some cognitive delays - his mother brought a list of medications with her which was not fully accurate. Patient taking ASA 81mg  BID rather than 325mg  daily, reviewed with patient that he should still be taking citalopram and digoxin daily. Patient and mother were unsure if he is taking midodrine - was D/Ced in HF clinic last month and restarted at [facility] after. BP today 84/52, patient does not complain of dizziness.   Cina Klumpp E. Loraina Stauffer, Pharm.D Clinical Pharmacy Resident Pager: [phone] 01/29/2015 10:53 AM

## 2015-01-29 NOTE — Progress Notes (Signed)
Patient ID: Carl [name], male   DOB: 29-Apr-1951, 64 y.o.   MRN: [id number] PCP: Dr. Ginette Pitman  64 yo with history of smoking and mild mental retardation Patient was admitted to [facility] in 12/15 with dyspnea.  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  LHC showed 3 vessel disease with EF 15%.  Echo showed EF 15-20%.  Patient had CABG x 5.  It was difficult to wean him off pressors post-op.  He ended up having to start midodrine.  This was weaned off.  He returns for followup with his mother.  He is difficult historian. He denies exertional dyspnea.  No lightheadedness, syncope, or falls.  He is now going to cardiac rehab at [facility] 3 times a week. He says it is going well.  He is tolerating Coreg.  Weight is up 10 lbs from last visit, says he does not regulalry take his weights at home. States he is compliant with low salt and low fluid intake.  Mother agrees that he is careful about his diet.  He states he does have some chest pain, and is even having some currently.  He denies any relation to activity or rest.  He states it comes and goes on its own.  He is not sure how long it has been going on.  Echo 3/16 EF 30-35% with moderate MR.  Labs (12/15): K 4.1, creatinine 0.81, hgb 9.1 Labs (09/12/2014): K 3.7 Creatinine 0.96, digoxin 0.7 Labs (3/16): digoxin 0.8, LDL 62, HDL 40 Labs (4/16): K 4.3, creatinine 1.1 Labs (5/16) K 4.6, creatinine 1.24,   PMH: 1. Smoker 2. Mild mental retardation 3. CAD: LHC (12/15) with 3 vessel disease.  CABG x 5 in 12/15 with LIMA-LAD, SVG-D1, sequential SVG-OM2/OM3, SVG-PDA.   4. Ischemic cardiomyopathy: Echo (12/15) with EF 15-20%, wall motion abnormalities, mildly decreased RV systolic function, mild MR.  Echo (3/16) with EF 30-35%, severe LV dilation, moderate MR, PA systolic pressure 42 mmHg.  5. Depression  SH: Lives with mother, quit smoking in 12/15.  No ETOH.   FH: Brother and father with MIs.    ROS: All systems reviewed and negative except as per HPI.   Current  Outpatient Prescriptions  Medication Sig Dispense Refill  . aspirin 81 MG tablet Take 81 mg by mouth 2 (two) times daily.    Marland Kitchen atorvastatin (LIPITOR) 40 MG tablet Take 1 tablet (40 mg total) by mouth daily at 6 PM. 30 tablet 1  . carvedilol (COREG) 3.125 MG tablet Take 3.125 mg by mouth 2 (two) times daily with a meal.    . citalopram (CELEXA) 20 MG tablet Take 20 mg by mouth daily.    . digoxin (LANOXIN) 0.125 MG tablet Take 1 tablet (0.125 mg total) by mouth daily. 30 tablet 1  . ferrous sulfate 325 (65 FE) MG tablet Take 325 mg by mouth daily with breakfast.    . midodrine (PROAMATINE) 10 MG tablet Take 10 mg by mouth 3 (three) times daily.     Marland Kitchen omeprazole (PRILOSEC) 20 MG capsule Take 20 mg by mouth 2 (two) times daily.    Marland Kitchen spironolactone (ALDACTONE) 25 MG tablet Take 0.5 tablets (12.5 mg total) by mouth daily. 15 tablet 3   No current facility-administered medications for this encounter.   BP 84/52 mmHg  Pulse 82  Wt 162 lb 12.8 oz (73.846 kg)  SpO2 97%   Orthostatics Sitting: 92/62 Standing: 94/62  General: NAD Mother present Neck: Minimal JVD 5-6 cm, no thyromegaly or thyroid nodule.  Lungs: Clear to auscultation bilaterally with normal respiratory effort. CV: Nondisplaced PMI.  Heart regular S1/S2, no S3/S4, no murmur.  Trace peripheral edema LEs.  No carotid bruit.  Normal pedal pulses.  Abdomen: Soft, nontender, no hepatosplenomegaly, no distention.  Skin: Intact without lesions or rashes.  Neurologic: Alert and oriented x 3.  Psych: Flat affect. Extremities: No clubbing or cyanosis.  HEENT: Normal.   EKG: NSR. Q waves in inferior and anteroseptal leads.  No ST changes.  Assessment/Plan: 1. CAD: Stable s/p CABG. Doing well. Doubt CP is ischemic.ECG today is ok.  Continue ASA, statin.  He is doing cardiac re[facility] at [facility].  2. Chronic systolic CHF: Ischemic CMP, EF 15-20% by echo in 12/15 but improved to 30-35% on 3/16 echo. NYHA II. Volume status stable.  BP soft but he  does not get lightheaded. Negative Orthostatics.  - BP improved. Will keep off midodrine - Continue Coreg 3.125 mg bid.  - Off lasix. Volume status ok on spironolactone 12.5 mg daily.  - Continue digoxin, - EF remains below 35% but there appears to have been significant improvement on the 3/16 echo.  Will repeat echo in July to look for further.Narrow QRS, not CRT candidate.  improvement, before proceeding to ICD. 3. Smoking: He has quit smoking since CABG. 4. Depression: On Celexa.  5. Hyperlipidemia: Good lipids 3/16, continue statin.      Followup 1 month for medication titration.    Satira Mccallum Tillery  01/29/2015   Patient seen and examined with Oda Kilts, PA-C. We discussed all aspects of the encounter. I agree with the assessment and plan as stated above.   He is doing well. NYHA II. Volume status ok on spiro. CP does not appears ischemic. BP improved systolic 497 when I checked personally. He is not orthostatic. Will keep off midodrine for now. No room to titrate other meds just yet. Holding off on ICD until repeat echo.  Bensimhon, Daniel,MD 1:02 PM

## 2015-01-29 NOTE — Progress Notes (Signed)
Daily Session Note  Patient Details  Name: YOSHIHARU BRASSELL MRN: [id number] Date of Birth: [birth year] Referring Provider:  [name], MD  Encounter Date: 01/29/2015  Check In:     Session Check In - 01/29/15 1559    Check-In   Staff Present Heath Lark RN, BSN, CCRP;Carroll Enterkin RN, BSN;Terel Bann BS, ACSM EP-C, Exercise Physiologist   ER physicians immediately available to respond to emergencies See telemetry face sheet for immediately available ER MD   Medication changes reported     No   Fall or balance concerns reported    No   Warm-up and Cool-down Performed on first and last piece of equipment   VAD Patient? No   Pain Assessment   Currently in Pain? No/denies         Goals Met:  Proper associated with RPD/PD & O2 Sat Exercise tolerated well No report of cardiac concerns or symptoms Strength training completed today  Goals Unmet:  Not Applicable  Goals Comments:    Dr. Emily Filbert is Medical Director for [place] and [facility].

## 2015-01-30 ENCOUNTER — Encounter: Payer: Self-pay | Admitting: Surgery

## 2015-01-31 ENCOUNTER — Encounter: Payer: Medicare Other | Admitting: *Deleted

## 2015-01-31 ENCOUNTER — Telehealth (HOSPITAL_COMMUNITY): Payer: Self-pay | Admitting: Vascular Surgery

## 2015-01-31 DIAGNOSIS — Z951 Presence of aortocoronary bypass graft: Secondary | ICD-10-CM | POA: Diagnosis not present

## 2015-01-31 NOTE — Telephone Encounter (Signed)
Nurse from cardiac rehab called pt weight is up 2 lbs and ankles are swelling.. Please call pt brother with any changes to medication.. Please advise

## 2015-01-31 NOTE — Progress Notes (Unsigned)
Daily Session Note  Patient Details  Name: JAXXSON CAVANAH MRN: [id number] Date of Birth: 1951/04/18 Referring Provider:  [name], MD  Encounter Date: 01/31/2015  Check In:     Session Check In - 01/31/15 1629    Check-In   Staff Present Gerlene Burdock RN, BSN;Renee Dillard Essex MS, ACSM CEP Exercise Physiologist;Diane Mariana Arn, BSN   ER physicians immediately available to respond to emergencies See telemetry face sheet for immediately available ER MD   Medication changes reported     No   Warm-up and Cool-down Performed on first and last piece of equipment   VAD Patient? No   Pain Assessment   Currently in Pain? No/denies         Goals Met:  Proper associated with RPD/PD & O2 Sat Exercise tolerated well  Goals Unmet:  Not Applicable  Goals Comments: I called [facility]  At [phone] to tell them Warner Mccreedy c/o ankle swelling. Weight is up 2 lbs. Not short of breath. No other s/s. I asked her to call back his brother Wadell Craddock at [phone].    Dr. Emily Filbert is Medical Director for [place] and [facility].

## 2015-02-01 DIAGNOSIS — Z951 Presence of aortocoronary bypass graft: Secondary | ICD-10-CM

## 2015-02-01 NOTE — Progress Notes (Signed)
Daily Session Note  Patient Details  Name: Carl [name] MRN: [id number] Date of Birth: November 23, 1950 Referring Provider:  [name], MD  Encounter Date: 02/01/2015  Check In:     Session Check In - 02/01/15 1633    Check-In   Staff Present Lestine Box BS, ACSM EP-C, Exercise Physiologist;Carroll Enterkin RN, BSN;Diane Joya Gaskins RN, BSN   ER physicians immediately available to respond to emergencies See telemetry face sheet for immediately available ER MD   Medication changes reported     No   Fall or balance concerns reported    No   Warm-up and Cool-down Performed on first and last piece of equipment   VAD Patient? No   Pain Assessment   Currently in Pain? No/denies         Goals Met:  Proper associated with RPD/PD & O2 Sat Exercise tolerated well No report of cardiac concerns or symptoms Strength training completed today  Goals Unmet:  Not Applicable  Goals Comments:    Dr. Emily Filbert is Medical Director for [place] and [facility].

## 2015-02-01 NOTE — Telephone Encounter (Signed)
pts brother returned call States he has not seen brother today so unable to give information on sxs  60 Brother had multiple concerns regarding medications States medication were changed at last office visit and he was unable to accompany  Patient at last appointment he was very unclear about medication list  Medication list reviewed with brother (lasix and potassium d/c'd 5/9 and midodrine still on hold as of 6/6), however he does not have meds in front of him and he is not responsible for med box completion Advised pts brother very difficult to review all med changes and complete medication list over the phone Patient should return to clinic for bp check and review medication list  Unable to schedule nurse visit at this time Advised to call back once he knew what day he could come in

## 2015-02-02 ENCOUNTER — Other Ambulatory Visit (HOSPITAL_COMMUNITY): Payer: Self-pay | Admitting: *Deleted

## 2015-02-02 NOTE — Telephone Encounter (Signed)
Pts brother called and wanted to go over pts med list becausehe wasn't at the last appt and he is the care giver.   We went over pts medications and informed him of the pts next scheduled appt.

## 2015-02-05 DIAGNOSIS — Z951 Presence of aortocoronary bypass graft: Secondary | ICD-10-CM

## 2015-02-05 NOTE — Progress Notes (Signed)
Daily Session Note  Patient Details  Name: CARLEN FILS MRN: [id number] Date of Birth: [birth year] Referring Provider:  [name], MD  Encounter Date: 02/05/2015  Check In:     Session Check In - 02/05/15 1610    Check-In   Staff Present Heath Lark RN, BSN, CCRP;Steven Way BS, ACSM EP-C, Exercise Physiologist;Carroll Radio producer, BSN   ER physicians immediately available to respond to emergencies See telemetry face sheet for immediately available ER MD   Medication changes reported     No   Fall or balance concerns reported    No   Warm-up and Cool-down Performed on first and last piece of equipment   VAD Patient? No   Pain Assessment   Currently in Pain? No/denies         Goals Met:  Exercise tolerated well No report of cardiac concerns or symptoms  Goals Unmet:  Not Applicable  Goals Comments:    Dr. Emily Filbert is Medical Director for [place] and [facility].

## 2015-02-05 NOTE — Progress Notes (Signed)
Daily Session Note  Patient Details  Name: CORY KITT MRN: [id number] Date of Birth: 31-Jan-1951 Referring Provider:  [name], MD  Encounter Date: 02/05/2015  Check In:     Session Check In - 02/05/15 1610    Check-In   Staff Present Heath Lark RN, BSN, CCRP;Seraj Dunnam BS, ACSM EP-C, Exercise Physiologist;Carroll Radio producer, BSN   ER physicians immediately available to respond to emergencies See telemetry face sheet for immediately available ER MD   Medication changes reported     No   Fall or balance concerns reported    No   Warm-up and Cool-down Performed on first and last piece of equipment   VAD Patient? No   Pain Assessment   Currently in Pain? No/denies         Goals Met:  Proper associated with RPD/PD & O2 Sat Exercise tolerated well No report of cardiac concerns or symptoms Strength training completed today  Goals Unmet:  Not Applicable  Goals Comments:   Dr. Emily Filbert is Medical Director for [place] and [facility].

## 2015-02-07 ENCOUNTER — Encounter: Payer: Medicare Other | Admitting: *Deleted

## 2015-02-07 DIAGNOSIS — Z951 Presence of aortocoronary bypass graft: Secondary | ICD-10-CM | POA: Diagnosis not present

## 2015-02-07 NOTE — Progress Notes (Signed)
Daily Session Note  Patient Details  Name: Carl [name] MRN: [id number] Date of Birth: 03-Mar-1951 Referring Provider:  [name], MD  Encounter Date: 02/07/2015  Check In:     Session Check In - 02/07/15 1627    Check-In   Staff Present Nyoka Cowden RN;Carroll Enterkin RN, Drusilla Kanner MS, ACSM CEP Exercise Physiologist   ER physicians immediately available to respond to emergencies See telemetry face sheet for immediately available ER MD   Medication changes reported     No   Fall or balance concerns reported    No   VAD Patient? No   Pain Assessment   Currently in Pain? No/denies   Multiple Pain Sites No         Goals Met:  Exercise tolerated well No report of cardiac concerns or symptoms Strength training completed today  Goals Unmet:  Not Applicable  Goals Comments:    Dr. Emily Filbert is Medical Director for [place] and [facility].

## 2015-02-07 NOTE — Progress Notes (Signed)
Daily Session Note  Patient Details  Name: Carl [name] MRN: [id number] Date of Birth: 05/07/1951 Referring Provider:  Hande, Vishwanath, MD  Encounter Date: 02/05/2015  Check In:      Goals Met:  Proper associated with RPD/PD & O2 Sat Goals Unmet:  HR Goals Comments:Faxed to DR. Dalton McLean rhythm strips since bigemny and multifocal PVC but Jakolby Ambrocio does not feel them and no c/o. Blood pressure is stable with them.    Dr. Mark Miller is Medical Director for [facility] and [facility]. 

## 2015-02-08 ENCOUNTER — Telehealth (HOSPITAL_COMMUNITY): Payer: Self-pay | Admitting: *Deleted

## 2015-02-08 ENCOUNTER — Encounter: Payer: Medicare Other | Admitting: *Deleted

## 2015-02-08 DIAGNOSIS — Z951 Presence of aortocoronary bypass graft: Secondary | ICD-10-CM | POA: Diagnosis not present

## 2015-02-08 NOTE — Telephone Encounter (Signed)
Pt's brother called to clarifity if pt should still be taking the midodrine or not, he states his mother brought the pt to the last appt and she told him the MD had said pt should not take med however it is still on the med list that they brought home so he is confused.  Advised that OV 6/6 states off midodrine 2 times, he will have pt stop medication

## 2015-02-08 NOTE — Progress Notes (Signed)
Daily Session Note  Patient Details  Name: Carl [name] MRN: [id number] Date of Birth: [birth year] Referring Provider:  [name], MD  Encounter Date: 02/08/2015  Check In:     Session Check In - 02/08/15 1637    Check-In   Staff Present Lestine Box BS, ACSM EP-C, Exercise Physiologist;Carroll Enterkin RN, BSN;Diane Joya Gaskins RN, BSN   ER physicians immediately available to respond to emergencies See telemetry face sheet for immediately available ER MD   Medication changes reported     No   Fall or balance concerns reported    No   Warm-up and Cool-down Performed on first and last piece of equipment   VAD Patient? No   Pain Assessment   Currently in Pain? No/denies         Goals Met:  Proper associated with RPD/PD & O2 Sat Exercise tolerated well No report of cardiac concerns or symptoms Strength training completed today  Goals Unmet:  Not Applicable  Goals Comments:   Dr. Emily Filbert is Medical Director for [place] and [facility].

## 2015-02-12 DIAGNOSIS — Z951 Presence of aortocoronary bypass graft: Secondary | ICD-10-CM

## 2015-02-12 NOTE — Progress Notes (Signed)
Daily Session Note  Patient Details  Name: Carl [name] MRN: [id number] Date of Birth: 08/18/1951 Referring Provider:  [name], MD  Encounter Date: 02/12/2015  Check In:     Session Check In - 02/12/15 1614    Check-In   Staff Present Heath Lark RN, BSN, CCRP;Ladoris Lythgoe BS, ACSM EP-C, Exercise Physiologist;Renee Dillard Essex MS, ACSM CEP Exercise Physiologist   ER physicians immediately available to respond to emergencies See telemetry face sheet for immediately available ER MD   Medication changes reported     No   Fall or balance concerns reported    No   Warm-up and Cool-down Performed on first and last piece of equipment   VAD Patient? No   Pain Assessment   Currently in Pain? No/denies         Goals Met:  Proper associated with RPD/PD & O2 Sat Exercise tolerated well No report of cardiac concerns or symptoms Strength training completed today  Goals Unmet:  Not Applicable  Goals Comments:   Dr. Emily Filbert is Medical Director for [place] and [facility].

## 2015-02-14 ENCOUNTER — Encounter: Payer: Medicare Other | Admitting: *Deleted

## 2015-02-14 ENCOUNTER — Other Ambulatory Visit: Payer: Self-pay | Admitting: *Deleted

## 2015-02-14 DIAGNOSIS — Z951 Presence of aortocoronary bypass graft: Secondary | ICD-10-CM

## 2015-02-14 NOTE — Progress Notes (Signed)
Daily Session Note  Patient Details  Name: Carl [name] MRN: [id number] Date of Birth: 12/09/1950 Referring Provider:  [name], MD  Encounter Date: 02/14/2015  Check In:     Session Check In - 02/14/15 1644    Check-In   Staff Present Gerlene Burdock RN, Drusilla Kanner MS, ACSM CEP Exercise Physiologist;Diane Mariana Arn, BSN   ER physicians immediately available to respond to emergencies See telemetry face sheet for immediately available ER MD   Medication changes reported     No   Fall or balance concerns reported    No   Warm-up and Cool-down Performed on first and last piece of equipment   VAD Patient? No   Pain Assessment   Currently in Pain? No/denies         Goals Met:  Proper associated with RPD/PD & O2 Sat Exercise tolerated well  Goals Unmet:  Not Applicable  Goals Comments:    Dr. Emily Filbert is Medical Director for [place] and [facility].

## 2015-02-15 DIAGNOSIS — Z951 Presence of aortocoronary bypass graft: Secondary | ICD-10-CM

## 2015-02-15 NOTE — Progress Notes (Signed)
Daily Session Note  Patient Details  Name: Carl [name] MRN: [id number] Date of Birth: 1951-06-12 Referring Provider:  [name], MD  Encounter Date: 02/15/2015  Check In:     Session Check In - 02/15/15 1627    Check-In   Staff Present Lestine Box BS, ACSM EP-C, Exercise Physiologist;Carroll Enterkin RN, BSN;Diane Joya Gaskins RN, BSN   ER physicians immediately available to respond to emergencies See telemetry face sheet for immediately available ER MD   Medication changes reported     No   Fall or balance concerns reported    No   Warm-up and Cool-down Performed on first and last piece of equipment   VAD Patient? No   Pain Assessment   Currently in Pain? No/denies         Goals Met:  Proper associated with RPD/PD & O2 Sat Exercise tolerated well No report of cardiac concerns or symptoms Strength training completed today  Goals Unmet:  Not Applicable  Goals Comments:    Dr. Emily Filbert is Medical Director for [place] and [facility].

## 2015-02-19 ENCOUNTER — Encounter: Payer: Self-pay | Admitting: *Deleted

## 2015-02-19 DIAGNOSIS — Z951 Presence of aortocoronary bypass graft: Secondary | ICD-10-CM

## 2015-02-19 NOTE — Patient Instructions (Signed)
Discharge Instructions  Patient Details  Name: Carl [name] MRN: [id number] Date of Birth: June 05, 1951 Referring Provider:  Dr. Einar Crow    Number of Visits:36 Reason for Discharge:  Patient reached a stable level of exercise. Patient independent in their exercise.  Smoking History:  History  Smoking status  . Former Smoker  Smokeless tobacco  . Not on file    Diagnosis:  S/P CABG (coronary artery bypass graft)  Initial Exercise Prescription:   Discharge Exercise Prescription (Final Exercise Prescription Changes):     Exercise Prescription Changes - 02/13/15 1600    Exercise Review   Progression Yes   Response to Exercise   Blood Pressure (Admit) 108/60 mmHg   Blood Pressure (Exercise) 120/68 mmHg   Blood Pressure (Exit) 94/62 mmHg   Heart Rate (Admit) 68 bpm   Heart Rate (Exercise) 69 bpm   Heart Rate (Exit) 59 bpm   Rating of Perceived Exertion (Exercise) 11   Duration Progress to 30 minutes of continuous aerobic without signs/symptoms of physical distress   Intensity Rest + 30   Progression Continue progressive overload as per policy without signs/symptoms or physical distress.   Resistance Training   Training Prescription Yes   Weight 2   Reps 10-12   NuStep   Level 4   Watts 50   Minutes 25   REL-XR   Level 3   Watts 50   Minutes 20      Functional Capacity:     6 Minute Walk      12/04/14 1016 02/12/15 1704     6 Minute Walk   Phase Initial Discharge    Distance 420 feet 1440 feet    Walk Time 6 minutes 6 minutes    Resting HR 66 bpm 68 bpm    Resting BP 108/72 mmHg 108/60 mmHg    Max Ex. HR 93 bpm 66 bpm    Max Ex. BP 110/74 mmHg 120/68 mmHg    RPE 12 12    Perceived Dyspnea   2    Symptoms Yes (comment) No    Comments NO SYMPTOMS   Test done on NUSTEP        Quality of Life:   Personal Goals: Goals established at orientation with interventions provided to work toward goal.    Personal Goals Discharge:   Nutrition &  Weight - Outcomes:     Pre Biometrics - 12/04/14 1017    Pre Biometrics   Height 5\' 11"  (1.803 m)   Weight 151 lb (68.493 kg)   Waist Circumference 34.5 inches   Hip Circumference 36.5 inches   Waist to Hip Ratio 0.95 %   BMI (Calculated) 21.1         Post Biometrics - 02/19/15 1606     Post  Biometrics   Waist Circumference 34.5 inches   Hip Circumference 36.5 inches   Waist to Hip Ratio 0.95 %      Nutrition:   Nutrition Discharge:   Education Questionnaire Score:   Goals reviewed with patient; copy given to patient.

## 2015-02-19 NOTE — Progress Notes (Signed)
Daily Session Note  Patient Details  Name: Carl [name] MRN: [id number] Date of Birth: 09-10-1950 Referring Provider:  [name], MD  Encounter Date: 02/19/2015  Check In:     Session Check In - 02/19/15 1617    Check-In   Staff Present Heath Lark RN, BSN, CCRP;Anushka Hartinger BS, ACSM EP-C, Exercise Physiologist;Carroll Radio producer, BSN   ER physicians immediately available to respond to emergencies See telemetry face sheet for immediately available ER MD   Medication changes reported     No   Fall or balance concerns reported    No   Warm-up and Cool-down Performed on first and last piece of equipment   VAD Patient? No   Pain Assessment   Currently in Pain? No/denies         Goals Met:  Proper associated with RPD/PD & O2 Sat Exercise tolerated well No report of cardiac concerns or symptoms Strength training completed today  Goals Unmet:  Not Applicable  Goals Comments:   Dr. Emily Filbert is Medical Director for [place] and [facility].

## 2015-02-22 ENCOUNTER — Encounter: Payer: Self-pay | Admitting: *Deleted

## 2015-02-22 DIAGNOSIS — Z951 Presence of aortocoronary bypass graft: Secondary | ICD-10-CM

## 2015-03-06 ENCOUNTER — Ambulatory Visit (HOSPITAL_COMMUNITY): Admission: RE | Admit: 2015-03-06 | Payer: Medicare Other | Source: Ambulatory Visit

## 2015-03-06 ENCOUNTER — Encounter (HOSPITAL_COMMUNITY): Payer: Medicare Other

## 2015-03-19 ENCOUNTER — Encounter: Payer: Self-pay | Admitting: Cardiology

## 2015-03-29 ENCOUNTER — Ambulatory Visit (HOSPITAL_BASED_OUTPATIENT_CLINIC_OR_DEPARTMENT_OTHER)
Admission: RE | Admit: 2015-03-29 | Discharge: 2015-03-29 | Disposition: A | Discharge status: Home / Self Care | Payer: Medicare Other | Source: Ambulatory Visit | Attending: Cardiology | Admitting: Cardiology

## 2015-03-29 ENCOUNTER — Ambulatory Visit (HOSPITAL_COMMUNITY)
Admission: RE | Admit: 2015-03-29 | Discharge: 2015-03-29 | Disposition: A | Discharge status: Home / Self Care | Payer: Medicare Other | Source: Ambulatory Visit | Attending: Cardiology | Admitting: Cardiology

## 2015-03-29 VITALS — BP 100/66 | HR 57 | Wt 160.4 lb

## 2015-03-29 DIAGNOSIS — E785 Hyperlipidemia, unspecified: Secondary | ICD-10-CM | POA: Diagnosis not present

## 2015-03-29 DIAGNOSIS — I081 Rheumatic disorders of both mitral and tricuspid valves: Secondary | ICD-10-CM | POA: Insufficient documentation

## 2015-03-29 DIAGNOSIS — Z87891 Personal history of nicotine dependence: Secondary | ICD-10-CM | POA: Insufficient documentation

## 2015-03-29 DIAGNOSIS — I255 Ischemic cardiomyopathy: Secondary | ICD-10-CM | POA: Diagnosis not present

## 2015-03-29 DIAGNOSIS — F70 Mild intellectual disabilities: Secondary | ICD-10-CM | POA: Insufficient documentation

## 2015-03-29 DIAGNOSIS — I2510 Atherosclerotic heart disease of native coronary artery without angina pectoris: Secondary | ICD-10-CM | POA: Diagnosis not present

## 2015-03-29 DIAGNOSIS — Z7982 Long term (current) use of aspirin: Secondary | ICD-10-CM | POA: Insufficient documentation

## 2015-03-29 DIAGNOSIS — Z951 Presence of aortocoronary bypass graft: Secondary | ICD-10-CM

## 2015-03-29 DIAGNOSIS — F329 Major depressive disorder, single episode, unspecified: Secondary | ICD-10-CM | POA: Diagnosis not present

## 2015-03-29 DIAGNOSIS — Z8249 Family history of ischemic heart disease and other diseases of the circulatory system: Secondary | ICD-10-CM | POA: Insufficient documentation

## 2015-03-29 DIAGNOSIS — I5022 Chronic systolic (congestive) heart failure: Secondary | ICD-10-CM

## 2015-03-29 DIAGNOSIS — Z79899 Other long term (current) drug therapy: Secondary | ICD-10-CM | POA: Insufficient documentation

## 2015-03-29 LAB — BASIC METABOLIC PANEL
ANION GAP: 7 (ref 5–15)
BUN: 14 mg/dL (ref 6–20)
CHLORIDE: 102 mmol/L (ref 101–111)
CO2: 30 mmol/L (ref 22–32)
Calcium: 9.2 mg/dL (ref 8.9–10.3)
Creatinine, Ser: 1.17 mg/dL (ref 0.61–1.24)
GFR calc Af Amer: >60 mL/min (ref >60)
GFR calc non Af Amer: >60 mL/min (ref >60)
GLUCOSE: 106 mg/dL — AB (ref 65–99)
Potassium: 4.1 mmol/L (ref 3.5–5.1)
Sodium: 139 mmol/L (ref 135–145)

## 2015-03-29 LAB — BRAIN NATRIURETIC PEPTIDE: B NATRIURETIC PEPTIDE 5: 666.8 pg/mL — AB (ref 0.0–100.0)

## 2015-03-29 LAB — DIGOXIN LEVEL: Digoxin Level: 0.7 ng/mL — ABNORMAL LOW (ref 0.8–2.0)

## 2015-03-29 MED ORDER — FUROSEMIDE 20 MG PO TABS: 20.0000 mg | ORAL_TABLET | ORAL | Status: DC

## 2015-03-29 MED ORDER — LISINOPRIL 2.5 MG PO TABS: 2.5000 mg | ORAL_TABLET | Freq: Every day | ORAL | Status: DC

## 2015-03-29 NOTE — Patient Instructions (Signed)
START Lisinopril 2.5 mg , one tab daily at bedtime START Furosemide 20 mg, one tab every other day  Labs today and again in weeks (BMET)  You have been referred to [place], [place] [address] [phone] (Electrophysiology)   You have been referred to [place] and [facility] for assistance with your nutritional management, they will call you arrange an appointment  Your physician recommends that you schedule a follow-up appointment in: 2 months  Do the following things EVERYDAY: 1) Weigh yourself in the morning before breakfast. Write it down and keep it in a log. 2) Take your medicines as prescribed 3) Eat low salt foods-Limit salt (sodium) to 2000 mg per day.  4) Stay as active as you can everyday 5) Limit all fluids for the day to less than 2 liters 6)

## 2015-03-29 NOTE — Progress Notes (Signed)
Patient ID: Carl [name], male   DOB: 1951-05-20, 64 y.o.   MRN: [id number] PCP: Dr. Ginette Pitman  64 yo with history of smoking and mild mental retardation Patient was admitted to [facility] in 12/15 with dyspnea.  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  LHC showed 3 vessel disease with EF 15%.  Echo showed EF 15-20%.  Patient had CABG x 5.  It was difficult to wean him off pressors post-op.  He ended up having to start midodrine.  This was weaned off.  He returns for today followup with Echo. Weight down 2 lbs from last visit. Denies any trouble breathing.  Still having some chest soreness with unclear alleviating/aggravating factors. Has a rash in the same area. States he is compliant with low salt. Says he drinks a lot. Denies lightheadedness or dizziness on Coreg. Graduated cardiac rehab at [facility]. Wants to go to [facility] to continue working out. Will not eat fruit or vegetables per mother.  Echo today showed EF 25-30% with severe LV dilation, RV mildly dilated with mildly decreased systolic function.   Labs (12/15): K 4.1, creatinine 0.81, hgb 9.1 Labs (09/12/2014): K 3.7 Creatinine 0.96, digoxin 0.7 Labs (3/16): digoxin 0.8, LDL 62, HDL 40 Labs (4/16): K 4.3, creatinine 1.1 Labs (5/16) K 4.6, creatinine 1.24, digoxin 1.0  PMH: 1. Smoker 2. Mild mental retardation 3. CAD: LHC (12/15) with 3 vessel disease.  CABG x 5 in 12/15 with LIMA-LAD, SVG-D1, sequential SVG-OM2/OM3, SVG-PDA.   4. Ischemic cardiomyopathy: Echo (12/15) with EF 15-20%, wall motion abnormalities, mildly decreased RV systolic function, mild MR.  Echo (3/16) with EF 30-35%, severe LV dilation, moderate MR, PA systolic pressure 42 mmHg. Echo (8/16) with EF 25-30%, severely dilated LV, diffuse hypokinesis with inferior akinesis, restrictive diastolic function, RV mildly dilated with mildly decreased systolic function, moderate MR.  5. Depression  SH: Lives with mother, quit smoking in 12/15.  No ETOH.   FH: Brother and father with MIs.    ROS:  All systems reviewed and negative except as per HPI.   Current Outpatient Prescriptions  Medication Sig Dispense Refill  . aspirin 81 MG tablet Take 81 mg by mouth 2 (two) times daily.    Marland Kitchen atorvastatin (LIPITOR) 40 MG tablet Take 1 tablet (40 mg total) by mouth daily at 6 PM. 30 tablet 1  . carvedilol (COREG) 3.125 MG tablet Take 3.125 mg by mouth 2 (two) times daily with a meal.    . citalopram (CELEXA) 20 MG tablet Take 20 mg by mouth daily.    . digoxin (LANOXIN) 0.125 MG tablet Take 1 tablet (0.125 mg total) by mouth daily. 30 tablet 1  . ferrous sulfate 325 (65 FE) MG tablet Take 325 mg by mouth daily with breakfast.    . omeprazole (PRILOSEC) 20 MG capsule Take 20 mg by mouth 2 (two) times daily.    Marland Kitchen spironolactone (ALDACTONE) 25 MG tablet Take 0.5 tablets (12.5 mg total) by mouth daily. 15 tablet 3   No current facility-administered medications for this encounter.   BP 100/66 mmHg  Pulse 57  Wt 160 lb 6.4 oz (72.757 kg)  SpO2 98%   General: NAD Mother present Neck: JVP 8 cm, no thyromegaly or thyroid nodule.  Lungs: Clear to auscultation bilaterally with normal respiratory effort. CV: Nondisplaced PMI.  Heart regular S1/S2, no S3/S4, 3/6 HSM at apex.  Trace peripheral edema LEs.  No carotid bruit.  Normal pedal pulses.  Abdomen: Soft, nontender, no hepatosplenomegaly, no distention.  Skin:  Intact without lesions. Eczematous rash on L chest, near where he says pain is worse. Neurologic: Alert and oriented x 3.  Psych: Flat affect. Extremities: No clubbing or cyanosis.  HEENT: Normal.    Assessment/Plan: 1. CAD: Stable s/p CABG. Doing well.  Atypical chest pain, doubt that it is ischemic.  Continue ASA, statin.  He completed cardiac rehab at [facility].  2. Chronic systolic CHF: Ischemic CMP. EF 25-30% on today's echo.  NYHA class II symptoms, overall doing well.  Very mild volume overload on exam.  BP better, no longer on midodrine.  - He will need referral to EP for ICD. Narrow  QRS, not CRT candidate. - Continue Coreg 3.125 mg bid and spironolactone 12.5 mg daily. BMET/BNP today.  - Start back on low dose lasix 20 mg every other day. Volume status slightly elevated.   - Add lisinopril 2.5 mg qhs.  BMET in 2 wks.  - Continue digoxin, recent level 1.0 was borderline elevated from goal, will repeat digoxin level today.    3. Smoking: He has quit smoking since CABG. 4. Depression: On Celexa.  5. Hyperlipidemia: Good lipids 3/16, continue statin.     6. MR: Moderate on echo, significant murmur on exam.  Follow over time.   Followup 2 month. Refer to EP for ICD, Refer to Nutrition for diet advice. Labs today and in two weeks.  Satira Mccallum Tillery PA-C 03/29/2015   Patient seen with PA, agree with the above note.  He is mildly volume overloaded on exam. Will start back on Lasix every other day.  BP stable, start lisinopril 2.5 qhs.  He will need BMET/digoxin level today and BMET in 2 wks.  Echo today was reviewed, EF remains low at 25-30%.  He needs referral to EP for ICD.   [name] 03/31/2015

## 2015-03-29 NOTE — Progress Notes (Signed)
  Echocardiogram 2D Echocardiogram has been performed.  Jennette Dubin 03/29/2015, 11:00 AM

## 2015-04-02 ENCOUNTER — Telehealth (HOSPITAL_COMMUNITY): Payer: Self-pay

## 2015-04-02 NOTE — Telephone Encounter (Signed)
Mother of patient left VM on triage line stating she had question about medication.  Attempted to return call to both home and cell phones, no answer.  Will attempt again later today.  Renee Pain

## 2015-04-11 ENCOUNTER — Ambulatory Visit (HOSPITAL_COMMUNITY)
Admission: RE | Admit: 2015-04-11 | Discharge: 2015-04-11 | Disposition: A | Discharge status: Home / Self Care | Payer: Medicare Other | Source: Ambulatory Visit | Attending: Internal Medicine | Admitting: Internal Medicine

## 2015-04-11 DIAGNOSIS — I5022 Chronic systolic (congestive) heart failure: Secondary | ICD-10-CM | POA: Insufficient documentation

## 2015-04-11 LAB — BASIC METABOLIC PANEL
ANION GAP: 3 — AB (ref 5–15)
BUN: 23 mg/dL — ABNORMAL HIGH (ref 6–20)
CALCIUM: 9.1 mg/dL (ref 8.9–10.3)
CO2: 32 mmol/L (ref 22–32)
CREATININE: 1.34 mg/dL — AB (ref 0.61–1.24)
Chloride: 104 mmol/L (ref 101–111)
GFR calc Af Amer: >60 mL/min (ref >60)
GFR calc non Af Amer: 55 mL/min — ABNORMAL LOW (ref >60)
Glucose, Bld: 73 mg/dL (ref 65–99)
Potassium: 4.5 mmol/L (ref 3.5–5.1)
Sodium: 139 mmol/L (ref 135–145)

## 2015-04-16 ENCOUNTER — Encounter: Payer: Self-pay | Admitting: *Deleted

## 2015-04-16 ENCOUNTER — Ambulatory Visit: Payer: Medicare Other | Admitting: Anesthesiology

## 2015-04-16 ENCOUNTER — Encounter
Admission: RE | Disposition: A | Discharge status: Home Health | Payer: Self-pay | Source: Ambulatory Visit | Attending: Gastroenterology

## 2015-04-16 ENCOUNTER — Ambulatory Visit
Admission: RE | Admit: 2015-04-16 | Discharge: 2015-04-16 | Disposition: A | Discharge status: Home Health | Payer: Medicare Other | Source: Ambulatory Visit | Attending: Gastroenterology | Admitting: Gastroenterology

## 2015-04-16 DIAGNOSIS — L80 Vitiligo: Secondary | ICD-10-CM | POA: Insufficient documentation

## 2015-04-16 DIAGNOSIS — Z7982 Long term (current) use of aspirin: Secondary | ICD-10-CM | POA: Diagnosis not present

## 2015-04-16 DIAGNOSIS — M1990 Unspecified osteoarthritis, unspecified site: Secondary | ICD-10-CM | POA: Diagnosis not present

## 2015-04-16 DIAGNOSIS — I2510 Atherosclerotic heart disease of native coronary artery without angina pectoris: Secondary | ICD-10-CM | POA: Diagnosis not present

## 2015-04-16 DIAGNOSIS — Z9889 Other specified postprocedural states: Secondary | ICD-10-CM | POA: Insufficient documentation

## 2015-04-16 DIAGNOSIS — L409 Psoriasis, unspecified: Secondary | ICD-10-CM | POA: Insufficient documentation

## 2015-04-16 DIAGNOSIS — Z951 Presence of aortocoronary bypass graft: Secondary | ICD-10-CM | POA: Insufficient documentation

## 2015-04-16 DIAGNOSIS — Z880 Allergy status to penicillin: Secondary | ICD-10-CM | POA: Insufficient documentation

## 2015-04-16 DIAGNOSIS — Z87891 Personal history of nicotine dependence: Secondary | ICD-10-CM | POA: Insufficient documentation

## 2015-04-16 DIAGNOSIS — K2980 Duodenitis without bleeding: Secondary | ICD-10-CM | POA: Insufficient documentation

## 2015-04-16 DIAGNOSIS — R933 Abnormal findings on diagnostic imaging of other parts of digestive tract: Secondary | ICD-10-CM | POA: Insufficient documentation

## 2015-04-16 DIAGNOSIS — Z79899 Other long term (current) drug therapy: Secondary | ICD-10-CM | POA: Diagnosis not present

## 2015-04-16 DIAGNOSIS — K219 Gastro-esophageal reflux disease without esophagitis: Secondary | ICD-10-CM | POA: Diagnosis not present

## 2015-04-16 DIAGNOSIS — K449 Diaphragmatic hernia without obstruction or gangrene: Secondary | ICD-10-CM | POA: Diagnosis not present

## 2015-04-16 DIAGNOSIS — H9190 Unspecified hearing loss, unspecified ear: Secondary | ICD-10-CM | POA: Insufficient documentation

## 2015-04-16 DIAGNOSIS — I252 Old myocardial infarction: Secondary | ICD-10-CM | POA: Insufficient documentation

## 2015-04-16 DIAGNOSIS — D509 Iron deficiency anemia, unspecified: Secondary | ICD-10-CM | POA: Diagnosis present

## 2015-04-16 HISTORY — DX: Atherosclerotic heart disease of native coronary artery without angina pectoris: I25.10

## 2015-04-16 HISTORY — DX: Gastro-esophageal reflux disease without esophagitis: K21.9

## 2015-04-16 HISTORY — DX: Acute myocardial infarction, unspecified: I21.9

## 2015-04-16 HISTORY — PX: ESOPHAGOGASTRODUODENOSCOPY (EGD) WITH PROPOFOL: SHX5813

## 2015-04-16 HISTORY — DX: Unspecified osteoarthritis, unspecified site: M19.90

## 2015-04-16 SURGERY — ESOPHAGOGASTRODUODENOSCOPY (EGD) WITH PROPOFOL
Anesthesia: General

## 2015-04-16 MED ORDER — SODIUM CHLORIDE 0.9 % IV SOLN: INTRAVENOUS | Status: DC

## 2015-04-16 MED ORDER — SODIUM CHLORIDE 0.9 % IV SOLN
INTRAVENOUS | Status: DC
  Administered 2015-04-16: 1000 mL via INTRAVENOUS

## 2015-04-16 MED ORDER — PROPOFOL 10 MG/ML IV BOLUS
INTRAVENOUS | Status: DC | PRN
  Administered 2015-04-16 (×3): 20 mg via INTRAVENOUS
  Administered 2015-04-16: 10 mg via INTRAVENOUS
  Administered 2015-04-16 (×5): 20 mg via INTRAVENOUS

## 2015-04-16 NOTE — Anesthesia Preprocedure Evaluation (Signed)
Anesthesia Evaluation  Patient identified by MRN, date of birth, ID band Patient awake    Reviewed: Allergy & Precautions, H&P , NPO status , Patient's Chart, lab work & pertinent test results, reviewed documented beta blocker date and time   Airway Mallampati: II  TM Distance: >3 FB Neck ROM: full    Dental no notable dental hx.    Pulmonary neg pulmonary ROS, former smoker,  breath sounds clear to auscultation  Pulmonary exam normal       Cardiovascular Exercise Tolerance: Good + CAD, + Past MI and +CHF negative cardio ROS  Rhythm:regular Rate:Normal     Neuro/Psych negative neurological ROS  negative psych ROS   GI/Hepatic negative GI ROS, Neg liver ROS, PUD, GERD-  ,  Endo/Other  negative endocrine ROS  Renal/GU Renal diseasenegative Renal ROS  negative genitourinary   Musculoskeletal   Abdominal   Peds  Hematology negative hematology ROS (+)   Anesthesia Other Findings   Reproductive/Obstetrics negative OB ROS                             Anesthesia Physical Anesthesia Plan  ASA: III  Anesthesia Plan: General   Post-op Pain Management:    Induction:   Airway Management Planned:   Additional Equipment:   Intra-op Plan:   Post-operative Plan:   Informed Consent: I have reviewed the patients History and Physical, chart, labs and discussed the procedure including the risks, benefits and alternatives for the proposed anesthesia with the patient or authorized representative who has indicated his/her understanding and acceptance.   Dental Advisory Given  Plan Discussed with: CRNA  Anesthesia Plan Comments:         Anesthesia Quick Evaluation

## 2015-04-16 NOTE — Op Note (Signed)
[facility] Gastroenterology Patient Name: Carl [name] Procedure Date: 04/16/2015 11:48 AM MRN: [id number] Account #: [id number] Date of Birth: Dec 11, 1950 Admit Type: Outpatient Age: 64 Room: [facility] ENDO ROOM 3 Gender: Male Note Status: Finalized Procedure:         Upper GI endoscopy Indications:       Iron deficiency anemia, Abnormal video capsule endoscopy Patient Profile:   This is a 64 year old male. Providers:         Gerrit Heck. Rayann Heman, MD Referring MD:      [name], MD (Referring MD) Medicines:         Propofol per Anesthesia Complications:     No immediate complications. Procedure:         Pre-Anesthesia Assessment:                    - Prior to the procedure, a History and Physical was                     performed, and patient medications, allergies and                     sensitivities were reviewed. The patient's tolerance of                     previous anesthesia was reviewed.                    After obtaining informed consent, the endoscope was passed                     under direct vision. Throughout the procedure, the                     patient's blood pressure, pulse, and oxygen saturations                     were monitored continuously. The Endoscope was introduced                     through the mouth, and advanced to the second part of                     duodenum. The upper GI endoscopy was accomplished without                     difficulty. The patient tolerated the procedure well. Findings:      A medium-sized hiatus hernia was present.      The stomach was normal.      Localized moderate mucosal abnormality characterized by congestion and       granularity was found in the duodenal sweep. Biopsies were taken with a       cold forceps for histology. Difficult area tto inpect due to location in       duodenal sweep. Estimated blood loss was minimal.      The exam was otherwise without abnormality. Impression:        -  Medium-sized hiatus hernia.                    - Normal stomach.                    - Mucosal abnormality in the duodenal sweep (  hypertrophied and granular folds) Biopsied. ? clinical                     significance. Doubt this accounts for the ongoing anemia                     despite PO iron.                    - The examination was otherwise normal. Recommendation:    - Observe patient in GI recovery unit.                    - Continue present medications.                    - Await pathology results. Consider CT a/p or EUS pending                     biopsy findings.                    - Refer to a hematologist at appointment to be scheduled                     for ongoing IDA                    - The findings and recommendations were discussed with the                     patient.                    - The findings and recommendations were discussed with the                     patient's family. Procedure Code(s): --- Professional ---                    [id number], Esophagogastroduodenoscopy, flexible, transoral;                     with biopsy, single or multiple CPT copyright 2014 American Medical Association. All rights reserved. The codes documented in this report are preliminary and upon coder review may  be revised to meet current compliance requirements. Mellody Life, MD 04/16/2015 12:18:46 PM This report has been signed electronically. Number of Addenda: 0 Note Initiated On: 04/16/2015 11:48 AM      [facility]

## 2015-04-16 NOTE — Transfer of Care (Signed)
Immediate Anesthesia Transfer of Care Note  Patient: Carl [name]  Procedure(s) Performed: Procedure(s): ESOPHAGOGASTRODUODENOSCOPY (EGD) WITH PROPOFOL (N/A)  Patient Location: PACU and Endoscopy Unit  Anesthesia Type:General  Level of Consciousness: sedated  Airway & Oxygen Therapy: Patient Spontanous Breathing and Patient connected to nasal cannula oxygen  Post-op Assessment: Report given to RN  Post vital signs: stable  Last Vitals:  Filed Vitals:   04/16/15 1049  BP: 91/59  Pulse: 52  Temp: 35.9 C  Resp: 16    Complications: No apparent anesthesia complications

## 2015-04-16 NOTE — H&P (Signed)
Primary Care Physician:  [name], MD  Pre-Procedure History & Physical: HPI:  Carl [name] is a 64 y.o. male is here for an endoscopy.   Past Medical History  Diagnosis Date  . Psoriasis   . Hearing loss   . Paronychia   . Vitiligo   . PUD (peptic ulcer disease)   . Leg fracture, left   . Paronychia   . Tobacco abuse   . Myocardial infarction   . Coronary artery disease   . GERD (gastroesophageal reflux disease)   . Arthritis     Past Surgical History  Procedure Laterality Date  . Hernia repair    . Coronary artery bypass graft N/A 08/03/2014    Procedure: CORONARY ARTERY BYPASS GRAFTING (CABG);  Surgeon: [name], MD;  Location: [place];  Service: Open Heart Surgery;  Laterality: N/A;  . Tee without cardioversion N/A 08/03/2014    Procedure: TRANSESOPHAGEAL ECHOCARDIOGRAM (TEE);  Surgeon: [name], MD;  Location: [place];  Service: Open Heart Surgery;  Laterality: N/A;    Prior to Admission medications   Medication Sig Start Date End Date Taking? Authorizing Provider  carvedilol (COREG) 3.125 MG tablet Take 3.125 mg by mouth 2 (two) times daily with a meal.   Yes Historical Provider, MD  citalopram (CELEXA) 20 MG tablet Take 20 mg by mouth daily.   Yes Historical Provider, MD  digoxin (LANOXIN) 0.125 MG tablet Take 1 tablet (0.125 mg total) by mouth daily. 08/15/14  Yes [name] Gold, PA-C  ferrous sulfate 325 (65 FE) MG tablet Take 325 mg by mouth daily with breakfast.   Yes Historical Provider, MD  aspirin 81 MG tablet Take 81 mg by mouth 2 (two) times daily.    Historical Provider, MD  atorvastatin (LIPITOR) 40 MG tablet Take 1 tablet (40 mg total) by mouth daily at 6 PM. 08/15/14   [name] Gold, PA-C  furosemide (LASIX) 20 MG tablet Take 1 tablet (20 mg total) by mouth every other day. 03/29/15   [name], MD  lisinopril (ZESTRIL) 2.5 MG tablet Take 1 tablet (2.5 mg total) by mouth daily. Patient not taking: Reported on 04/16/2015 03/29/15   [name], MD  omeprazole (PRILOSEC) 20 MG capsule Take 20 mg by mouth 2 (two) times daily.    Historical Provider, MD  spironolactone (ALDACTONE) 25 MG tablet Take 0.5 tablets (12.5 mg total) by mouth daily. Patient not taking: Reported on 04/16/2015 01/01/15   [name], MD    Allergies as of 04/09/2015  . (No Known Allergies)    History reviewed. No pertinent family history.  Social History   Social History  . Marital Status: Single    Spouse Name: N/A  . Number of Children: N/A  . Years of Education: N/A   Occupational History  . Not on file.   Social History Main Topics  . Smoking status: Former Research scientist (life sciences)  . Smokeless tobacco: Not on file  . Alcohol Use: No  . Drug Use: Not on file  . Sexual Activity: Not on file   Other Topics Concern  . Not on file   Social History Narrative     Physical Exam: BP 91/59 mmHg  Pulse 52  Temp(Src) 96.6 F (35.9 C) (Tympanic)  Resp 16  Ht 6' (1.829 m)  Wt 72.576 kg (160 lb)  BMI 21.70 kg/m2  SpO2 100% General:   Alert,  pleasant and cooperative in NAD Head:  Normocephalic and atraumatic. Neck:  Supple;  no masses or thyromegaly. Lungs:  Clear throughout to auscultation.    Heart:  Regular rate and rhythm. Abdomen:  Soft, nontender and nondistended. Normal bowel sounds, without guarding, and without rebound.   Neurologic:  Alert and  oriented x4;  grossly normal neurologically.  Impression/Plan: Carl [name] is here for an endoscopy to be performed for anemia, anbormality in D2 on capsule endoscopy  Risks, benefits, limitations, and alternatives regarding  endoscopy have been reviewed with the patient.  Questions have been answered.  All parties agreeable.   [name], MD  04/16/2015, 11:47 AM

## 2015-04-16 NOTE — Anesthesia Postprocedure Evaluation (Signed)
  Anesthesia Post-op Note  Patient: Carl [name]  Procedure(s) Performed: Procedure(s): ESOPHAGOGASTRODUODENOSCOPY (EGD) WITH PROPOFOL (N/A)  Anesthesia type:General  Patient location: PACU  Post pain: Pain level controlled  Post assessment: Post-op Vital signs reviewed, Patient's Cardiovascular Status Stable, Respiratory Function Stable, Patent Airway and No signs of Nausea or vomiting  Post vital signs: Reviewed and stable  Last Vitals:  Filed Vitals:   04/16/15 1049  BP: 91/59  Pulse: 52  Temp: 35.9 C  Resp: 16    Level of consciousness: awake, alert  and patient cooperative  Complications: No apparent anesthesia complications

## 2015-04-17 LAB — SURGICAL PATHOLOGY

## 2015-04-18 ENCOUNTER — Other Ambulatory Visit (HOSPITAL_COMMUNITY): Payer: Self-pay | Admitting: Cardiology

## 2015-04-18 ENCOUNTER — Encounter: Payer: Self-pay | Admitting: Gastroenterology

## 2015-04-18 DIAGNOSIS — I5022 Chronic systolic (congestive) heart failure: Secondary | ICD-10-CM

## 2015-04-20 ENCOUNTER — Encounter: Payer: Self-pay | Admitting: Oncology

## 2015-04-20 ENCOUNTER — Inpatient Hospital Stay: Payer: Medicare Other | Attending: Oncology | Admitting: Oncology

## 2015-04-20 ENCOUNTER — Inpatient Hospital Stay: Payer: Medicare Other

## 2015-04-20 DIAGNOSIS — Z87891 Personal history of nicotine dependence: Secondary | ICD-10-CM | POA: Diagnosis not present

## 2015-04-20 DIAGNOSIS — Z79899 Other long term (current) drug therapy: Secondary | ICD-10-CM | POA: Diagnosis not present

## 2015-04-20 DIAGNOSIS — K219 Gastro-esophageal reflux disease without esophagitis: Secondary | ICD-10-CM | POA: Diagnosis not present

## 2015-04-20 DIAGNOSIS — I2510 Atherosclerotic heart disease of native coronary artery without angina pectoris: Secondary | ICD-10-CM | POA: Insufficient documentation

## 2015-04-20 DIAGNOSIS — N289 Disorder of kidney and ureter, unspecified: Secondary | ICD-10-CM | POA: Diagnosis not present

## 2015-04-20 DIAGNOSIS — I252 Old myocardial infarction: Secondary | ICD-10-CM | POA: Diagnosis not present

## 2015-04-20 DIAGNOSIS — D509 Iron deficiency anemia, unspecified: Secondary | ICD-10-CM | POA: Insufficient documentation

## 2015-04-20 LAB — CBC
HCT: 44.3 % (ref 40.0–52.0)
HEMOGLOBIN: 14.5 g/dL (ref 13.0–18.0)
MCH: 30.3 pg (ref 26.0–34.0)
MCHC: 32.8 g/dL (ref 32.0–36.0)
MCV: 92.4 fL (ref 80.0–100.0)
PLATELETS: 154 10*3/uL (ref 150–440)
RBC: 4.79 MIL/uL (ref 4.40–5.90)
RDW: 15.3 % — ABNORMAL HIGH (ref 11.5–14.5)
WBC: 6.7 10*3/uL (ref 3.8–10.6)

## 2015-04-20 LAB — LACTATE DEHYDROGENASE: LDH: 136 U/L (ref 98–192)

## 2015-04-20 LAB — IRON AND TIBC
Iron: 114 ug/dL (ref 45–182)
SATURATION RATIOS: 33 % (ref 17.9–39.5)
TIBC: 350 ug/dL (ref 250–450)
UIBC: 236 ug/dL

## 2015-04-20 LAB — DAT, POLYSPECIFIC AHG (ARMC ONLY): POLYSPECIFIC AHG TEST: NEGATIVE

## 2015-04-20 LAB — FERRITIN: Ferritin: 40 ng/mL (ref 24–336)

## 2015-04-20 LAB — FOLATE: FOLATE: 9.9 ng/mL (ref >5.9)

## 2015-04-20 NOTE — Progress Notes (Signed)
[place]  [phone] [phone]) [phone]  ID: Carl [name] OB: 1951-07-29  MR#: [id number]  AOZ#:[id number]  Patient Care Team: [name], MD as PCP - General (Internal Medicine)  CHIEF COMPLAINT:  Chief Complaint  Patient presents with  . New Evaluation    IDA    INTERVAL HISTORY: Patient is a 64 year old male who was found to have iron deficiency anemia presumed to GI blood loss. He has had EGD, capsule endoscopy, and colonoscopy without a definitive source. His hemoglobin has improved without intervention over the past several months. Currently, he feels well and is asymptomatic. He does not complain of weakness and fatigue. He denies any fevers. He has no chest pain or shortness of breath. He denies any nausea, vomiting, constipation, or diarrhea. He has no melanoma or hematochezia. Patient feels at his baseline and offers no specific complaints today.  REVIEW OF SYSTEMS:   Review of Systems  Constitutional: Negative.   Respiratory: Negative.   Cardiovascular: Negative.   Gastrointestinal: Negative for blood in stool and melena.    As per HPI. Otherwise, a complete review of systems is negatve.  PAST MEDICAL HISTORY: Past Medical History  Diagnosis Date  . Psoriasis   . Hearing loss   . Paronychia   . Vitiligo   . PUD (peptic ulcer disease)   . Leg fracture, left   . Paronychia   . Tobacco abuse   . Myocardial infarction   . Coronary artery disease   . GERD (gastroesophageal reflux disease)   . Arthritis   . External hemorrhoids   . Internal hemorrhoids   . HH (hiatus hernia)   . Schatzki's ring   . Brunner's gland hyperplasia of duodenum   . Tubular adenoma of colon   . Duodenitis     PAST SURGICAL HISTORY: Past Surgical History  Procedure Laterality Date  . Hernia repair    . Coronary artery bypass graft N/A 08/03/2014    Procedure: CORONARY ARTERY BYPASS GRAFTING (CABG);  Surgeon: [name], MD;  Location:  [place];  Service: Open Heart Surgery;  Laterality: N/A;  . Tee without cardioversion N/A 08/03/2014    Procedure: TRANSESOPHAGEAL ECHOCARDIOGRAM (TEE);  Surgeon: [name], MD;  Location: [place];  Service: Open Heart Surgery;  Laterality: N/A;  . Esophagogastroduodenoscopy (egd) with propofol N/A 04/16/2015    Procedure: ESOPHAGOGASTRODUODENOSCOPY (EGD) WITH PROPOFOL;  Surgeon: [name], MD;  Location: [facility] ENDOSCOPY;  Service: Endoscopy;  Laterality: N/A;    FAMILY HISTORY: No reported history of malignancy or chronic disease.     ADVANCED DIRECTIVES:    HEALTH MAINTENANCE: Social History  Substance Use Topics  . Smoking status: Former Research scientist (life sciences)  . Smokeless tobacco: Not on file  . Alcohol Use: No     Colonoscopy:  PAP:  Bone density:  Lipid panel:  No Known Allergies  Current Outpatient Prescriptions  Medication Sig Dispense Refill  . aspirin 81 MG tablet Take 81 mg by mouth 2 (two) times daily.    Marland Kitchen atorvastatin (LIPITOR) 40 MG tablet Take 1 tablet (40 mg total) by mouth daily at 6 PM. 30 tablet 1  . carvedilol (COREG) 3.125 MG tablet Take 3.125 mg by mouth 2 (two) times daily with a meal.    . citalopram (CELEXA) 20 MG tablet Take 20 mg by mouth daily.    . digoxin (LANOXIN) 0.125 MG tablet Take 1 tablet (0.125 mg total) by mouth daily. 30 tablet 1  . ferrous sulfate 325 (65 FE)  MG tablet Take 325 mg by mouth daily with breakfast.    . furosemide (LASIX) 20 MG tablet Take 1 tablet (20 mg total) by mouth every other day. 30 tablet 3  . lisinopril (ZESTRIL) 2.5 MG tablet Take 1 tablet (2.5 mg total) by mouth daily. 30 tablet 3  . omeprazole (PRILOSEC) 20 MG capsule Take 20 mg by mouth 2 (two) times daily.    . polyethylene glycol powder (GLYCOLAX/MIRALAX) powder     . potassium chloride (K-DUR) 10 MEQ tablet     . spironolactone (ALDACTONE) 25 MG tablet Take 0.5 tablets (12.5 mg total) by mouth daily. 15 tablet 3   No current facility-administered medications  for this visit.    OBJECTIVE: There were no vitals filed for this visit.   There is no weight on file to calculate BMI.    ECOG FS:0 - Asymptomatic  General: Well-developed, well-nourished, no acute distress. Eyes: Pink conjunctiva, anicteric sclera. HEENT: Normocephalic, moist mucous membranes, clear oropharnyx. Lungs: Clear to auscultation bilaterally. Heart: Regular rate and rhythm. No rubs, murmurs, or gallops. Abdomen: Soft, nontender, nondistended. No organomegaly noted, normoactive bowel sounds. Musculoskeletal: No edema, cyanosis, or clubbing. Neuro: Alert, answering all questions appropriately. Cranial nerves grossly intact. Skin: No rashes or petechiae noted. Psych: Normal affect. Lymphatics: No cervical, calvicular, axillary or inguinal LAD.   LAB RESULTS:  Lab Results  Component Value Date   NA 139 04/11/2015   K 4.5 04/11/2015   CL 104 04/11/2015   CO2 32 04/11/2015   GLUCOSE 73 04/11/2015   BUN 23* 04/11/2015   CREATININE 1.34* 04/11/2015   CALCIUM 9.1 04/11/2015   PROT 6.0 08/09/2014   ALBUMIN 2.9* 08/09/2014   AST 49* 08/09/2014   ALT 65* 08/09/2014   ALKPHOS 77 08/09/2014   BILITOT 1.0 08/09/2014   GFRNONAA 55* 04/11/2015   GFRAA >60 04/11/2015    Lab Results  Component Value Date   WBC 6.7 04/20/2015   NEUTROABS 5.3 07/27/2014   HGB 14.5 04/20/2015   HCT 44.3 04/20/2015   MCV 92.4 04/20/2015   PLT 154 04/20/2015     STUDIES: No results found.  ASSESSMENT: Iron deficiency anemia, resolved.  PLAN:    1. Anemia: Patient's hemoglobin is now within normal limits. Iron stores and the remainder of his laboratory workup is pending from today. Patient has had recent EGD, capsule endoscopy, and colonoscopy did not reveal any source of bleeding.  No intervention is needed at this time. Return to clinic in 3 months with repeat laboratory work and further evaluation. If patient's hemoglobin remains within normal limits, he likely can be discharged from  clinic. 2. Renal insufficiency: Mild, continue monitoring creatinine with primary care physician.  Patient expressed understanding and was in agreement with this plan. He also understands that He can call clinic at any time with any questions, concerns, or complaints.   Lloyd Huger, MD   04/20/2015 12:46 PM

## 2015-04-21 LAB — HAPTOGLOBIN: Haptoglobin: 115 mg/dL (ref 34–200)

## 2015-04-25 ENCOUNTER — Ambulatory Visit (INDEPENDENT_AMBULATORY_CARE_PROVIDER_SITE_OTHER): Payer: Medicare Other | Admitting: Internal Medicine

## 2015-04-25 ENCOUNTER — Encounter: Payer: Self-pay | Admitting: *Deleted

## 2015-04-25 ENCOUNTER — Encounter: Payer: Self-pay | Admitting: Internal Medicine

## 2015-04-25 VITALS — BP 86/58 | HR 70 | Ht 72.0 in | Wt 162.6 lb

## 2015-04-25 DIAGNOSIS — I255 Ischemic cardiomyopathy: Secondary | ICD-10-CM | POA: Diagnosis not present

## 2015-04-25 DIAGNOSIS — I5022 Chronic systolic (congestive) heart failure: Secondary | ICD-10-CM

## 2015-04-25 DIAGNOSIS — Z951 Presence of aortocoronary bypass graft: Secondary | ICD-10-CM

## 2015-04-25 LAB — CBC WITH DIFFERENTIAL/PLATELET
BASOS PCT: 0.6 % (ref 0.0–3.0)
Basophils Absolute: 0 10*3/uL (ref 0.0–0.1)
EOS PCT: 2.6 % (ref 0.0–5.0)
Eosinophils Absolute: 0.1 10*3/uL (ref 0.0–0.7)
HCT: 43.7 % (ref 39.0–52.0)
Hemoglobin: 14.6 g/dL (ref 13.0–17.0)
LYMPHS ABS: 1.5 10*3/uL (ref 0.7–4.0)
Lymphocytes Relative: 25.2 % (ref 12.0–46.0)
MCHC: 33.3 g/dL (ref 30.0–36.0)
MCV: 92.9 fl (ref 78.0–100.0)
MONOS PCT: 7.8 % (ref 3.0–12.0)
Monocytes Absolute: 0.5 10*3/uL (ref 0.1–1.0)
NEUTROS ABS: 3.7 10*3/uL (ref 1.4–7.7)
NEUTROS PCT: 63.8 % (ref 43.0–77.0)
Platelets: 178 10*3/uL (ref 150.0–400.0)
RBC: 4.71 Mil/uL (ref 4.22–5.81)
RDW: 15.6 % — AB (ref 11.5–15.5)
WBC: 5.8 10*3/uL (ref 4.0–10.5)

## 2015-04-25 LAB — BASIC METABOLIC PANEL
BUN: 25 mg/dL — ABNORMAL HIGH (ref 6–23)
CALCIUM: 9.1 mg/dL (ref 8.4–10.5)
CO2: 28 meq/L (ref 19–32)
CREATININE: 1.19 mg/dL (ref 0.40–1.50)
Chloride: 101 mEq/L (ref 96–112)
GFR: 65.47 mL/min (ref >60.00)
GLUCOSE: 95 mg/dL (ref 70–99)
Potassium: 4.3 mEq/L (ref 3.5–5.1)
Sodium: 136 mEq/L (ref 135–145)

## 2015-04-25 LAB — VITAMIN B12: VITAMIN B 12: 380 pg/mL (ref 180–914)

## 2015-04-25 NOTE — Patient Instructions (Addendum)
Medication Instructions:  Your physician recommends that you continue on your current medications as directed. Please refer to the Current Medication list given to you today.   Labwork: Your physician recommends that you return for lab work today:BMP/CBC   Testing/Procedures: Your physician has recommended that you have a defibrillator inserted. An implantable cardioverter defibrillator (ICD) is a small device that is placed in your chest or, in rare cases, your abdomen. This device uses electrical pulses or shocks to help control life-threatening, irregular heartbeats that could lead the heart to suddenly stop beating (sudden cardiac arrest). Leads are attached to the ICD that goes into your heart. This is done in the hospital and usually requires an overnight stay. Please see the instruction sheet given to you today for more information.    Follow-Up: Your physician recommends that you schedule a follow-up appointment in: 10-14 days from 05/01/15 in device clinic for wound check and 3 months with [name]   Any Other Special Instructions Will Be Listed Below (If Applicable)    Cardioverter Defibrillator Implantation An implantable cardioverter defibrillator (ICD) is a small, lightweight, battery-powered device that is placed (implanted) under the skin in the chest or abdomen. Your caregiver may prescribe an ICD if:  You have had an irregular heart rhythm (arrhythmia) that originated in the lower chambers of the heart (ventricles).  Your heart has been damaged by a disease (such as coronary artery disease) or heart condition (such as a heart attack). An ICD consists of a battery that lasts several years, a small computer called a pulse generator, and wires called leads that go into the heart. It is used to detect and correct two dangerous arrhythmias: a rapid heart rhythm (tachycardia) and an arrhythmia in which the ventricles contract in an uncoordinated way (fibrillation). When an ICD  detects tachycardia, it sends an electrical signal to the heart that restores the heartbeat to normal (cardioversion). This signal is usually painless. If cardioversion does not work or if the ICD detects fibrillation, it delivers a small electrical shock to the heart (defibrillation) to restart the heart. The shock may feel like a strong jolt in the chest.ICDs may be programmed to correct other problems. Sometimes, ICDs are programmed to act as another type of implantable device called a pacemaker. Pacemakers are used to treat a slow heartbeat (bradycardia). LET YOUR CAREGIVER KNOW ABOUT:  Any allergies you have.  All medicines you are taking, including vitamins, herbs, eyedrops, and over-the-counter medicines and creams.  Previous problems you or members of your family have had with the use of anesthetics.  Any blood disorders you have had.  Other health problems you have. RISKS AND COMPLICATIONS Generally, the procedure to implant an ICD is safe. However, as with any surgical procedure, complications can occur. Possible complications associated with implanting an ICD include:  Swelling, bleeding, or bruising at the site where the ICD was implanted.  Infection at the site where the ICD was implanted.  A reaction to medicine used during the procedure.  Nerve, heart, or blood vessel damage.  Blood clots. BEFORE THE PROCEDURE  You may need to have blood tests, heart tests, or a chest X-ray done before the day of the procedure.  Ask your caregiver about changing or stopping your regular medicines.  Make plans to have someone drive you home. You may need to stay in the hospital overnight after the procedure.  Stop smoking at least 24 hours before the procedure.  Take a bath or shower the night before the  procedure. You may need to scrub your chest or abdomen with a special type of soap.  Do not eat or drink before your procedure for as long as directed by your caregiver. Ask if it  is okay to take any needed medicine with a small sip of water. PROCEDURE  The procedure to implant an ICD in your chest or abdomen is usually done at a hospital in a room that has a large X-ray machine called a fluoroscope. The machine will be above you during the procedure. It will help your caregiver see your heart during the procedure. Implanting an ICD usually takes 1-3 hours. Before the procedure:   Small monitors will be put on your body. They will be used to check your heart, blood pressure, and oxygen level.  A needle will be put into a vein in your hand or arm. This is called an intravenous (IV) access tube. Fluids and medicine will flow directly into your body through the IV tube.  Your chest or abdomen will be cleaned with a germ-killing (antiseptic) solution. The area may be shaved.  You may be given medicine to help you relax (sedative).  You will be given a medicine called a local anesthetic. This medicine will make the surgical site numb while the ICD is implanted. You will be sleepy but awake during the procedure. After you are numb the procedure will begin. The caregiver will:  Make a small cut (incision). This will make a pocket deep under your skin that will hold the pulse generator.  Guide the leads through a large blood vessel into your heart and attach them to the heart muscles. Depending on the ICD, the leads may go into one ventricle or they may go to both ventricles and into an upper chamber of the heart (atrium).  Test the ICD.  Close the incision with stitches, glue, or staples. AFTER THE PROCEDURE  You may feel pain. Some pain is normal. It may last a few days.  You may stay in a recovery area until the local anesthetic has worn off. Your blood pressure and pulse will be checked often. You will be taken to a room where your heart will be monitored.  A chest X-ray will be taken. This is done to check that the cardioverter defibrillator is in the right  place.  You may stay in the hospital overnight.  A slight bump may be seen over the skin where the ICD was placed. Sometimes, it is possible to feel the ICD under the skin. This is normal.  In the months and years afterward, your caregiver will check the device, the leads, and the battery every few months. Eventually, when the battery is low, the ICD will be replaced. Document Released: 05/03/2002 Document Revised: 06/01/2013 Document Reviewed: 08/30/2012 [facility] Patient Information 2015 [place], [place]. This information is not intended to replace advice given to you by your health care provider. Make sure you discuss any questions you have with your health care provider.

## 2015-04-25 NOTE — Progress Notes (Signed)
Electrophysiology Office Note   Date:  04/25/2015   ID:  Carl [name], DOB 1951/06/14, MRN [id number]  PCP:  [name], MD  Cardiologist:  Dr [name] Primary Electrophysiologist: Thompson Grayer, MD    Chief Complaint  Patient presents with  . Chronic systolic CHF     History of Present Illness: Carl [name] is a 64 y.o. male who presents today for electrophysiology evaluation and risk stratification of sudden death.   He has a h/o mild mentral retardation and recent MI.   He was admitted to [facility] in 12/15 with dyspnea.  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  LHC showed 3 vessel disease with EF 15%.   Patient had CABG x 5 12/101/5.  He had post procedure cardiogenic shock and hypotension requiring prolonged pressors/ inotrope's.  He has been aggressively managed in the CHF clinic, though medical therapy has been very limited due to hypotension.  The patient has occasional SOB with moderate activity. Follow-up echo reveals EF 25-30% with severe LV dilation, RV mildly dilated with mildly decreased systolic function.   He is therefore referred for EP consultation.  Today, he denies symptoms of palpitations, chest pain,  orthopnea, PND, lower extremity edema, claudication, dizziness, presyncope, syncope, bleeding, or neurologic sequela. The patient is tolerating medications without difficulties and is otherwise without complaint today.    Past Medical History  Diagnosis Date  . Psoriasis   . Hearing loss   . Paronychia   . Vitiligo   . PUD (peptic ulcer disease)   . Leg fracture, left   . Paronychia   . Myocardial infarction   . Coronary artery disease   . GERD (gastroesophageal reflux disease)   . Arthritis   . External hemorrhoids   . Internal hemorrhoids   . HH (hiatus hernia)   . Schatzki's ring   . Brunner's gland hyperplasia of duodenum   . Tubular adenoma of colon   . Duodenitis   . Ischemic cardiomyopathy    Past Surgical History  Procedure Laterality Date  .  Hernia repair    . Coronary artery bypass graft N/A 08/03/2014    Procedure: CORONARY ARTERY BYPASS GRAFTING (CABG);  Surgeon: [name], MD;  Location: [place];  Service: Open Heart Surgery;  Laterality: N/A;  . Tee without cardioversion N/A 08/03/2014    Procedure: TRANSESOPHAGEAL ECHOCARDIOGRAM (TEE);  Surgeon: [name], MD;  Location: [place];  Service: Open Heart Surgery;  Laterality: N/A;  . Esophagogastroduodenoscopy (egd) with propofol N/A 04/16/2015    Procedure: ESOPHAGOGASTRODUODENOSCOPY (EGD) WITH PROPOFOL;  Surgeon: [name], MD;  Location: [facility] ENDOSCOPY;  Service: Endoscopy;  Laterality: N/A;     Current Outpatient Prescriptions  Medication Sig Dispense Refill  . aspirin 81 MG tablet Take 81 mg by mouth 2 (two) times daily.    Marland Kitchen atorvastatin (LIPITOR) 40 MG tablet Take 1 tablet (40 mg total) by mouth daily at 6 PM. 30 tablet 1  . carvedilol (COREG) 3.125 MG tablet Take 3.125 mg by mouth 2 (two) times daily with a meal.    . citalopram (CELEXA) 20 MG tablet Take 20 mg by mouth daily.    . digoxin (LANOXIN) 0.125 MG tablet Take 1 tablet (0.125 mg total) by mouth daily. 30 tablet 1  . ferrous sulfate 325 (65 FE) MG tablet Take 325 mg by mouth daily with breakfast.    . furosemide (LASIX) 20 MG tablet Take 1 tablet (20 mg total) by mouth every other day. 30 tablet 3  .  lisinopril (ZESTRIL) 2.5 MG tablet Take 1 tablet (2.5 mg total) by mouth daily. 30 tablet 3  . omeprazole (PRILOSEC) 20 MG capsule Take 20 mg by mouth 2 (two) times daily.    . polyethylene glycol powder (GLYCOLAX/MIRALAX) powder Take 17 g by mouth daily.     . potassium chloride (K-DUR) 10 MEQ tablet Take 10 mEq by mouth daily.     Marland Kitchen spironolactone (ALDACTONE) 25 MG tablet Take 0.5 tablets (12.5 mg total) by mouth daily. 15 tablet 3   No current facility-administered medications for this visit.    Allergies:   Review of patient's allergies indicates no known allergies.   Social History:  The  patient  reports that he has quit smoking. He does not have any smokeless tobacco history on file. He reports that he does not drink alcohol or use illicit drugs.   Family History:  The patient's  family history includes CAD in his father; Diabetes in his brother; Heart Problems in his brother; Valvular heart disease in his mother.    ROS:  Please see the history of present illness.   All other systems are reviewed and negative.    PHYSICAL EXAM: VS:  BP 86/58 mmHg  Pulse 70  Ht 6' (1.829 m)  Wt 73.755 kg (162 lb 9.6 oz)  BMI 22.05 kg/m2 , BMI Body mass index is 22.05 kg/(m^2). GEN: Well nourished, well developed, in no acute distress, mild cognative impairment observed HEENT: normal Neck: no JVD, carotid bruits, or masses Cardiac: RRR; no murmurs, rubs, or gallops,no edema  Respiratory:  clear to auscultation bilaterally, normal work of breathing GI: soft, nontender, nondistended, + BS MS: no deformity or atrophy Skin: warm and dry, + changes of vitiligo Neuro:  Strength and sensation are intact Psych: euthymic mood, full affect though with mild cognative impairment  EKG:  EKG 01/29/15 reveals sinus rhythm 64 bpm, PR 152, QRS 88, QTc 418, LA enlarged, anteroseptal and inferior infarction pattern   Recent Labs: 07/27/2014: TSH 0.783 08/04/2014: Magnesium 1.9 08/09/2014: ALT 65* 03/29/2015: B Natriuretic Peptide 666.8* 04/11/2015: BUN 23*; Creatinine, Ser 1.34*; Potassium 4.5; Sodium 139 04/20/2015: Hemoglobin 14.5; Platelets 154    Lipid Panel     Component Value Date/Time   CHOL 109 11/07/2014 1200   TRIG 34 11/07/2014 1200   HDL 40 11/07/2014 1200   CHOLHDL 2.7 11/07/2014 1200   VLDL 7 11/07/2014 1200   LDLCALC 62 11/07/2014 1200     Wt Readings from Last 3 Encounters:  04/25/15 73.755 kg (162 lb 9.6 oz)  04/16/15 72.576 kg (160 lb)  03/29/15 72.757 kg (160 lb 6.4 oz)      Other studies Reviewed: Additional studies/ records that were reviewed today include: CHF  clinic notes, echo  Review of the above records today demonstrates: echo 03/29/15 reveals EF 25%, mild to moderate MR, LA 25mm   ASSESSMENT AND PLAN:  1.  The patient has an ischemic CM (EF 25%), NYHA Class II/III CHF, and CAD. Medical therapy has been optimized as much as BP will allow.  He has a narrow QRS and therefore is not a candidate for CRT.  At this time, he meets MADIT II/ SCD-HeFT criteria for ICD implantation for primary prevention of sudden death.  Risks, benefits, alternatives to ICD implantation were discussed in detail with the patient today. The patient  understands that the risks include but are not limited to bleeding, infection, pneumothorax, perforation, tamponade, vascular damage, renal failure, MI, stroke, death, inappropriate shocks, and lead dislodgement  and wishes to proceed.  We will therefore schedule device implantation at the next available time.  2. Left atrial enlargement Given LA enlargement, would consider medtronic single chamber pacemaker at implant with atrial fib algorythm in order to evaluate for AF long term   Current medicines are reviewed at length with the patient today.   The patient does not have concerns regarding his medicines.  The following changes were made today:  none   Signed, Thompson Grayer, MD  04/25/2015 3:21 PM     [address] [place] [address] [phone] (office) [phone] (fax)

## 2015-04-26 DIAGNOSIS — I255 Ischemic cardiomyopathy: Secondary | ICD-10-CM | POA: Insufficient documentation

## 2015-04-27 ENCOUNTER — Telehealth: Payer: Self-pay | Admitting: Internal Medicine

## 2015-04-27 NOTE — Telephone Encounter (Signed)
New Message  Pt mother ask that you call before 5pm today so she can go to the drug store   Pt c/o medication issue:  1. Name of Medication:   tolyethylene  Glycol    2. How are you currently taking this medication (dosage and times per day)?  Pt mother is unsure on how and medication is suspose to be given  3. Are you having a reaction (difficulty breathing--STAT)? no  4. What is your medication issue? Pt mother wants to know if he is suppose to be on any medications and what should he take

## 2015-04-27 NOTE — Telephone Encounter (Signed)
Spoke with patient's mother and let her know the medication is MIRALAX and this is an OTC.  This is not a medication that we normally start.  I let her know it was for constipation and she says he does not take it that she knows of.

## 2015-05-01 ENCOUNTER — Encounter (HOSPITAL_COMMUNITY): Payer: Self-pay

## 2015-05-01 ENCOUNTER — Encounter (HOSPITAL_COMMUNITY)
Admission: RE | Disposition: A | Discharge status: Home / Self Care | Payer: Self-pay | Source: Ambulatory Visit | Attending: Internal Medicine

## 2015-05-01 ENCOUNTER — Ambulatory Visit (HOSPITAL_COMMUNITY)
Admission: RE | Admit: 2015-05-01 | Discharge: 2015-05-02 | Disposition: A | Discharge status: Home / Self Care | Payer: Medicare Other | Source: Ambulatory Visit | Attending: Internal Medicine | Admitting: Internal Medicine

## 2015-05-01 DIAGNOSIS — Z7982 Long term (current) use of aspirin: Secondary | ICD-10-CM | POA: Diagnosis not present

## 2015-05-01 DIAGNOSIS — Z87891 Personal history of nicotine dependence: Secondary | ICD-10-CM | POA: Diagnosis not present

## 2015-05-01 DIAGNOSIS — K219 Gastro-esophageal reflux disease without esophagitis: Secondary | ICD-10-CM | POA: Insufficient documentation

## 2015-05-01 DIAGNOSIS — Z951 Presence of aortocoronary bypass graft: Secondary | ICD-10-CM | POA: Insufficient documentation

## 2015-05-01 DIAGNOSIS — I5022 Chronic systolic (congestive) heart failure: Secondary | ICD-10-CM | POA: Insufficient documentation

## 2015-05-01 DIAGNOSIS — I519 Heart disease, unspecified: Secondary | ICD-10-CM | POA: Diagnosis present

## 2015-05-01 DIAGNOSIS — Z79899 Other long term (current) drug therapy: Secondary | ICD-10-CM | POA: Diagnosis not present

## 2015-05-01 DIAGNOSIS — I255 Ischemic cardiomyopathy: Secondary | ICD-10-CM | POA: Diagnosis not present

## 2015-05-01 DIAGNOSIS — I252 Old myocardial infarction: Secondary | ICD-10-CM | POA: Insufficient documentation

## 2015-05-01 DIAGNOSIS — I2510 Atherosclerotic heart disease of native coronary artery without angina pectoris: Secondary | ICD-10-CM | POA: Insufficient documentation

## 2015-05-01 DIAGNOSIS — F70 Mild intellectual disabilities: Secondary | ICD-10-CM | POA: Diagnosis not present

## 2015-05-01 DIAGNOSIS — I5023 Acute on chronic systolic (congestive) heart failure: Secondary | ICD-10-CM | POA: Diagnosis present

## 2015-05-01 DIAGNOSIS — Z959 Presence of cardiac and vascular implant and graft, unspecified: Secondary | ICD-10-CM

## 2015-05-01 HISTORY — PX: EP IMPLANTABLE DEVICE: SHX172B

## 2015-05-01 LAB — SURGICAL PCR SCREEN
MRSA, PCR: NEGATIVE
Staphylococcus aureus: NEGATIVE

## 2015-05-01 SURGERY — ICD IMPLANT
Anesthesia: LOCAL

## 2015-05-01 MED ORDER — LISINOPRIL 2.5 MG PO TABS
2.5000 mg | ORAL_TABLET | Freq: Every day | ORAL | Status: DC
  Administered 2015-05-02: 2.5 mg via ORAL
  Filled 2015-05-01: qty 1

## 2015-05-01 MED ORDER — SODIUM CHLORIDE 0.9 % IV SOLN: 250.0000 mL | INTRAVENOUS | Status: DC | PRN

## 2015-05-01 MED ORDER — SODIUM CHLORIDE 0.9 % IV SOLN
INTRAVENOUS | Status: DC
  Administered 2015-05-01: 11:00:00 via INTRAVENOUS

## 2015-05-01 MED ORDER — MUPIROCIN 2 % EX OINT
TOPICAL_OINTMENT | CUTANEOUS | Status: AC
  Administered 2015-05-01: 1 via TOPICAL
  Filled 2015-05-01: qty 22

## 2015-05-01 MED ORDER — HEPARIN (PORCINE) IN NACL 2-0.9 UNIT/ML-% IJ SOLN
INTRAMUSCULAR | Status: DC | PRN
  Administered 2015-05-01: 14:00:00

## 2015-05-01 MED ORDER — CEFAZOLIN SODIUM-DEXTROSE 2-3 GM-% IV SOLR
INTRAVENOUS | Status: AC
  Filled 2015-05-01: qty 50

## 2015-05-01 MED ORDER — ASPIRIN 81 MG PO TABS: 81.0000 mg | ORAL_TABLET | Freq: Every day | ORAL | Status: DC

## 2015-05-01 MED ORDER — FENTANYL CITRATE (PF) 100 MCG/2ML IJ SOLN
INTRAMUSCULAR | Status: AC
  Filled 2015-05-01: qty 4

## 2015-05-01 MED ORDER — SODIUM CHLORIDE 0.9 % IR SOLN
80.0000 mg | Status: AC
  Administered 2015-05-01: 80 mg

## 2015-05-01 MED ORDER — LIDOCAINE HCL (PF) 1 % IJ SOLN
INTRAMUSCULAR | Status: AC
  Filled 2015-05-01: qty 30

## 2015-05-01 MED ORDER — CITALOPRAM HYDROBROMIDE 20 MG PO TABS
20.0000 mg | ORAL_TABLET | Freq: Every day | ORAL | Status: DC
  Administered 2015-05-02: 20 mg via ORAL
  Filled 2015-05-01: qty 1

## 2015-05-01 MED ORDER — CEFAZOLIN SODIUM 1-5 GM-% IV SOLN
1.0000 g | Freq: Four times a day (QID) | INTRAVENOUS | Status: AC
  Administered 2015-05-01 – 2015-05-02 (×3): 1 g via INTRAVENOUS
  Filled 2015-05-01 (×3): qty 50

## 2015-05-01 MED ORDER — ACETAMINOPHEN 325 MG PO TABS
325.0000 mg | ORAL_TABLET | ORAL | Status: DC | PRN
  Administered 2015-05-01: 650 mg via ORAL
  Filled 2015-05-01: qty 2

## 2015-05-01 MED ORDER — CEFAZOLIN SODIUM-DEXTROSE 2-3 GM-% IV SOLR
2.0000 g | INTRAVENOUS | Status: AC
  Administered 2015-05-01: 2 g via INTRAVENOUS

## 2015-05-01 MED ORDER — ASPIRIN EC 81 MG PO TBEC
81.0000 mg | DELAYED_RELEASE_TABLET | Freq: Every day | ORAL | Status: DC
  Administered 2015-05-02: 81 mg via ORAL
  Filled 2015-05-01: qty 1

## 2015-05-01 MED ORDER — SPIRONOLACTONE 25 MG PO TABS
12.5000 mg | ORAL_TABLET | Freq: Every day | ORAL | Status: DC
  Administered 2015-05-02: 12.5 mg via ORAL
  Filled 2015-05-01: qty 1

## 2015-05-01 MED ORDER — SODIUM CHLORIDE 0.9 % IV SOLN
INTRAVENOUS | Status: DC | PRN
  Administered 2015-05-01: 10 mL/h via INTRAVENOUS

## 2015-05-01 MED ORDER — LIDOCAINE HCL (PF) 1 % IJ SOLN
INTRAMUSCULAR | Status: DC | PRN
  Administered 2015-05-01: 50 mL via INTRADERMAL

## 2015-05-01 MED ORDER — HYDROCODONE-ACETAMINOPHEN 5-325 MG PO TABS: 1.0000 | ORAL_TABLET | ORAL | Status: DC | PRN

## 2015-05-01 MED ORDER — PANTOPRAZOLE SODIUM 40 MG PO TBEC
40.0000 mg | DELAYED_RELEASE_TABLET | Freq: Every day | ORAL | Status: DC
  Administered 2015-05-01 – 2015-05-02 (×2): 40 mg via ORAL
  Filled 2015-05-01 (×2): qty 1

## 2015-05-01 MED ORDER — SODIUM CHLORIDE 0.9 % IR SOLN
Status: AC
  Filled 2015-05-01: qty 2

## 2015-05-01 MED ORDER — MUPIROCIN 2 % EX OINT
1.0000 "application " | TOPICAL_OINTMENT | Freq: Once | CUTANEOUS | Status: AC
  Administered 2015-05-01: 1 via TOPICAL
  Filled 2015-05-01: qty 22

## 2015-05-01 MED ORDER — ONDANSETRON HCL 4 MG/2ML IJ SOLN: 4.0000 mg | Freq: Four times a day (QID) | INTRAMUSCULAR | Status: DC | PRN

## 2015-05-01 MED ORDER — MIDAZOLAM HCL 5 MG/5ML IJ SOLN
INTRAMUSCULAR | Status: AC
  Filled 2015-05-01: qty 25

## 2015-05-01 MED ORDER — CARVEDILOL 3.125 MG PO TABS
3.1250 mg | ORAL_TABLET | Freq: Two times a day (BID) | ORAL | Status: DC
  Administered 2015-05-01 – 2015-05-02 (×2): 3.125 mg via ORAL
  Filled 2015-05-01 (×2): qty 1

## 2015-05-01 MED ORDER — MIDAZOLAM HCL 5 MG/5ML IJ SOLN
INTRAMUSCULAR | Status: DC | PRN
  Administered 2015-05-01 (×2): 1 mg via INTRAVENOUS

## 2015-05-01 MED ORDER — FENTANYL CITRATE (PF) 100 MCG/2ML IJ SOLN
INTRAMUSCULAR | Status: DC | PRN
  Administered 2015-05-01 (×2): 25 ug via INTRAVENOUS

## 2015-05-01 SURGICAL SUPPLY — 7 items
CABLE SURGICAL S-101-97-12 (CABLE) ×2 IMPLANT
ICD VISIA AF VR DVAB1D4 (ICD Generator) ×1 IMPLANT
LEAD SPRINT QUAT SEC 6935M-62 (Lead) ×2 IMPLANT
PAD DEFIB LIFELINK (PAD) ×2 IMPLANT
SHEATH CLASSIC 9F (SHEATH) ×2 IMPLANT
TRAY PACEMAKER INSERTION (CUSTOM PROCEDURE TRAY) ×2 IMPLANT
VISIA AF VR DVAB1D4 (ICD Generator) ×2 IMPLANT

## 2015-05-01 NOTE — H&P (View-Only) (Signed)
Electrophysiology Office Note   Date:  04/25/2015   ID:  Carl [name], DOB 1950-10-10, MRN [id number]  PCP:  [name], MD  Cardiologist:  Dr [name] Primary Electrophysiologist: Thompson Grayer, MD    Chief Complaint  Patient presents with  . Chronic systolic CHF     History of Present Illness: Carl [name] is a 64 y.o. male who presents today for electrophysiology evaluation and risk stratification of sudden death.   He has a h/o mild mentral retardation and recent MI.   He was admitted to [facility] in 12/15 with dyspnea.  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  LHC showed 3 vessel disease with EF 15%.   Patient had CABG x 5 12/101/5.  He had post procedure cardiogenic shock and hypotension requiring prolonged pressors/ inotrope's.  He has been aggressively managed in the CHF clinic, though medical therapy has been very limited due to hypotension.  The patient has occasional SOB with moderate activity. Follow-up echo reveals EF 25-30% with severe LV dilation, RV mildly dilated with mildly decreased systolic function.   He is therefore referred for EP consultation.  Today, he denies symptoms of palpitations, chest pain,  orthopnea, PND, lower extremity edema, claudication, dizziness, presyncope, syncope, bleeding, or neurologic sequela. The patient is tolerating medications without difficulties and is otherwise without complaint today.    Past Medical History  Diagnosis Date  . Psoriasis   . Hearing loss   . Paronychia   . Vitiligo   . PUD (peptic ulcer disease)   . Leg fracture, left   . Paronychia   . Myocardial infarction   . Coronary artery disease   . GERD (gastroesophageal reflux disease)   . Arthritis   . External hemorrhoids   . Internal hemorrhoids   . HH (hiatus hernia)   . Schatzki's ring   . Brunner's gland hyperplasia of duodenum   . Tubular adenoma of colon   . Duodenitis   . Ischemic cardiomyopathy    Past Surgical History  Procedure Laterality Date  .  Hernia repair    . Coronary artery bypass graft N/A 08/03/2014    Procedure: CORONARY ARTERY BYPASS GRAFTING (CABG);  Surgeon: [name], MD;  Location: [place];  Service: Open Heart Surgery;  Laterality: N/A;  . Tee without cardioversion N/A 08/03/2014    Procedure: TRANSESOPHAGEAL ECHOCARDIOGRAM (TEE);  Surgeon: [name], MD;  Location: [place];  Service: Open Heart Surgery;  Laterality: N/A;  . Esophagogastroduodenoscopy (egd) with propofol N/A 04/16/2015    Procedure: ESOPHAGOGASTRODUODENOSCOPY (EGD) WITH PROPOFOL;  Surgeon: [name], MD;  Location: [facility] ENDOSCOPY;  Service: Endoscopy;  Laterality: N/A;     Current Outpatient Prescriptions  Medication Sig Dispense Refill  . aspirin 81 MG tablet Take 81 mg by mouth 2 (two) times daily.    Marland Kitchen atorvastatin (LIPITOR) 40 MG tablet Take 1 tablet (40 mg total) by mouth daily at 6 PM. 30 tablet 1  . carvedilol (COREG) 3.125 MG tablet Take 3.125 mg by mouth 2 (two) times daily with a meal.    . citalopram (CELEXA) 20 MG tablet Take 20 mg by mouth daily.    . digoxin (LANOXIN) 0.125 MG tablet Take 1 tablet (0.125 mg total) by mouth daily. 30 tablet 1  . ferrous sulfate 325 (65 FE) MG tablet Take 325 mg by mouth daily with breakfast.    . furosemide (LASIX) 20 MG tablet Take 1 tablet (20 mg total) by mouth every other day. 30 tablet 3  .  lisinopril (ZESTRIL) 2.5 MG tablet Take 1 tablet (2.5 mg total) by mouth daily. 30 tablet 3  . omeprazole (PRILOSEC) 20 MG capsule Take 20 mg by mouth 2 (two) times daily.    . polyethylene glycol powder (GLYCOLAX/MIRALAX) powder Take 17 g by mouth daily.     . potassium chloride (K-DUR) 10 MEQ tablet Take 10 mEq by mouth daily.     Marland Kitchen spironolactone (ALDACTONE) 25 MG tablet Take 0.5 tablets (12.5 mg total) by mouth daily. 15 tablet 3   No current facility-administered medications for this visit.    Allergies:   Review of patient's allergies indicates no known allergies.   Social History:  The  patient  reports that he has quit smoking. He does not have any smokeless tobacco history on file. He reports that he does not drink alcohol or use illicit drugs.   Family History:  The patient's  family history includes CAD in his father; Diabetes in his brother; Heart Problems in his brother; Valvular heart disease in his mother.    ROS:  Please see the history of present illness.   All other systems are reviewed and negative.    PHYSICAL EXAM: VS:  BP 86/58 mmHg  Pulse 70  Ht 6' (1.829 m)  Wt 73.755 kg (162 lb 9.6 oz)  BMI 22.05 kg/m2 , BMI Body mass index is 22.05 kg/(m^2). GEN: Well nourished, well developed, in no acute distress, mild cognative impairment observed HEENT: normal Neck: no JVD, carotid bruits, or masses Cardiac: RRR; no murmurs, rubs, or gallops,no edema  Respiratory:  clear to auscultation bilaterally, normal work of breathing GI: soft, nontender, nondistended, + BS MS: no deformity or atrophy Skin: warm and dry, + changes of vitiligo Neuro:  Strength and sensation are intact Psych: euthymic mood, full affect though with mild cognative impairment  EKG:  EKG 01/29/15 reveals sinus rhythm 64 bpm, PR 152, QRS 88, QTc 418, LA enlarged, anteroseptal and inferior infarction pattern   Recent Labs: 07/27/2014: TSH 0.783 08/04/2014: Magnesium 1.9 08/09/2014: ALT 65* 03/29/2015: B Natriuretic Peptide 666.8* 04/11/2015: BUN 23*; Creatinine, Ser 1.34*; Potassium 4.5; Sodium 139 04/20/2015: Hemoglobin 14.5; Platelets 154    Lipid Panel     Component Value Date/Time   CHOL 109 11/07/2014 1200   TRIG 34 11/07/2014 1200   HDL 40 11/07/2014 1200   CHOLHDL 2.7 11/07/2014 1200   VLDL 7 11/07/2014 1200   LDLCALC 62 11/07/2014 1200     Wt Readings from Last 3 Encounters:  04/25/15 73.755 kg (162 lb 9.6 oz)  04/16/15 72.576 kg (160 lb)  03/29/15 72.757 kg (160 lb 6.4 oz)      Other studies Reviewed: Additional studies/ records that were reviewed today include: CHF  clinic notes, echo  Review of the above records today demonstrates: echo 03/29/15 reveals EF 25%, mild to moderate MR, LA 63mm   ASSESSMENT AND PLAN:  1.  The patient has an ischemic CM (EF 25%), NYHA Class II/III CHF, and CAD. Medical therapy has been optimized as much as BP will allow.  He has a narrow QRS and therefore is not a candidate for CRT.  At this time, he meets MADIT II/ SCD-HeFT criteria for ICD implantation for primary prevention of sudden death.  Risks, benefits, alternatives to ICD implantation were discussed in detail with the patient today. The patient  understands that the risks include but are not limited to bleeding, infection, pneumothorax, perforation, tamponade, vascular damage, renal failure, MI, stroke, death, inappropriate shocks, and lead dislodgement  and wishes to proceed.  We will therefore schedule device implantation at the next available time.  2. Left atrial enlargement Given LA enlargement, would consider medtronic single chamber pacemaker at implant with atrial fib algorythm in order to evaluate for AF long term   Current medicines are reviewed at length with the patient today.   The patient does not have concerns regarding his medicines.  The following changes were made today:  none   Signed, Thompson Grayer, MD  04/25/2015 3:21 PM     [place] [address] [phone] (office) [phone] (fax)

## 2015-05-01 NOTE — Discharge Instructions (Signed)
° ° °  Supplemental Discharge Instructions for  Pacemaker/Defibrillator Patients  Activity No heavy lifting or vigorous activity with your left/right arm for 6 to 8 weeks.  Do not raise your left/right arm above your head for one week.  Gradually raise your affected arm as drawn below.           __     05/05/15                      05/06/15                     05/07/15               05/08/15  NO DRIVING for  1 week   ; you may begin driving on   12/21/74  .  WOUND CARE - Keep the wound area clean and dry.  Do not get this area wet for one week. No showers for one week; you may shower on   05/08/15  . - The tape/steri-strips on your wound will fall off; do not pull them off.  No bandage is needed on the site.  DO  NOT apply any creams, oils, or ointments to the wound area. - If you notice any drainage or discharge from the wound, any swelling or bruising at the site, or you develop a fever > 101? F after you are discharged home, call the office at once.  Special Instructions - You are still able to use cellular telephones; use the ear opposite the side where you have your pacemaker/defibrillator.  Avoid carrying your cellular phone near your device. - When traveling through airports, show security personnel your identification card to avoid being screened in the metal detectors.  Ask the security personnel to use the hand wand. - Avoid arc welding equipment, MRI testing (magnetic resonance imaging), TENS units (transcutaneous nerve stimulators).  Call the office for questions about other devices. - Avoid electrical appliances that are in poor condition or are not properly grounded. - Microwave ovens are safe to be near or to operate.  Additional information for defibrillator patients should your device go off: - If your device goes off ONCE and you feel fine afterward, notify the device clinic nurses. - If your device goes off ONCE and you do not feel well afterward, call 911. - If your device  goes off TWICE, call 911. - If your device goes off THREE times in one day, call 911.  DO NOT DRIVE YOURSELF OR A FAMILY MEMBER WITH A DEFIBRILLATOR TO THE HOSPITAL--CALL 911.

## 2015-05-01 NOTE — Interval H&P Note (Signed)
History and Physical Interval Note:  05/01/2015 12:53 PM   ICD Criteria  Current LVEF:25%. Within 12 months prior to implant: Yes   Heart failure history: Yes, Class II  Cardiomyopathy history: Yes, Ischemic Cardiomyopathy.  Atrial Fibrillation/Atrial Flutter: No.  Ventricular tachycardia history: No.  Cardiac arrest history: No.  History of syndromes with risk of sudden death: No.  Previous ICD: No  Current ICD indication: Primary  PPM indication: No.   Class I or II Bradycardia indication present: No  Beta Blocker therapy for 3 or more months: Yes, prescribed.   Ace Inhibitor/ARB therapy for 3 or more months: Yes, prescribed.      Carl [name]  has presented today for surgery, with the diagnosis of LV disfuntion, chf, ischemic cardiomyopathy  The various methods of treatment have been discussed with the patient and family. After consideration of risks, benefits and other options for treatment, the patient has consented to  Procedure(s): ICD Implant (N/A) as a surgical intervention .  The patient's history has been reviewed, patient examined, no change in status, stable for surgery.  I have reviewed the patient's chart and labs.  Questions were answered to the patient's satisfaction.     Thompson Grayer

## 2015-05-02 ENCOUNTER — Ambulatory Visit (HOSPITAL_COMMUNITY): Payer: Medicare Other

## 2015-05-02 ENCOUNTER — Encounter (HOSPITAL_COMMUNITY): Payer: Self-pay | Admitting: Internal Medicine

## 2015-05-02 DIAGNOSIS — Z951 Presence of aortocoronary bypass graft: Secondary | ICD-10-CM | POA: Diagnosis not present

## 2015-05-02 DIAGNOSIS — K219 Gastro-esophageal reflux disease without esophagitis: Secondary | ICD-10-CM | POA: Diagnosis not present

## 2015-05-02 DIAGNOSIS — I255 Ischemic cardiomyopathy: Secondary | ICD-10-CM

## 2015-05-02 DIAGNOSIS — I2510 Atherosclerotic heart disease of native coronary artery without angina pectoris: Secondary | ICD-10-CM | POA: Diagnosis not present

## 2015-05-02 NOTE — Discharge Summary (Signed)
ELECTROPHYSIOLOGY PROCEDURE DISCHARGE SUMMARY    Patient ID: Carl [name],  MRN: [id number], DOB/AGE: [birth year] 64 y.o.  Admit date: 05/01/2015 Discharge date: 05/02/2015  Primary Care Physician: [name], MD Primary Cardiologist: [name] Electrophysiologist: Takeria Marquina  Primary Discharge Diagnosis:  Ischemic cardiomyopathy status post ICD implantation this admission  Secondary Discharge Diagnosis:  1.  CAD s/p CABG 2.  GERD 3.  Mild mental retardation   No Known Allergies   Procedures This Admission:  1.  Implantation of a MDT single chamber ICD on 05/01/15 by [name].  The patient received a MDT model number Visia AF ICD with model number  [id number] right ventricular lead.  DFT's were deferred at time of implant.  There were no immediate post procedure complications. 2.  CXR on 05/02/15 demonstrated no pneumothorax status post device implantation.   Brief HPI: Carl [name] is a 64 y.o. male was referred to electrophysiology in the outpatient setting for consideration of ICD implantation.  Past medical history includes CAD and ischemic cardiomyopathy.  The patient has persistent LV dysfunction despite guideline directed therapy.  Risks, benefits, and alternatives to ICD implantation were reviewed with the patient who wished to proceed.   Hospital Course:  The patient was admitted and underwent implantation of a MDT single chamber ICD with details as outlined above. He was monitored on telemetry overnight which demonstrated sinus rhythm.  Left chest was without hematoma or ecchymosis.  The device was interrogated and found to be functioning normally.  CXR was obtained and demonstrated no pneumothorax status post device implantation.  Wound care, arm mobility, and restrictions were reviewed with the patient.  The patient was examined and considered stable for discharge to home.   The patient's discharge medications include an ACE-I (Lisinopril) and beta blocker (Coreg).    Physical Exam: Filed Vitals:   05/01/15 1810 05/02/15 0500 05/02/15 0510 05/02/15 0540  BP: 93/47  95/64 88/54  Pulse: 73     Temp:  97.8 F (36.6 C)    TempSrc:  Oral    Resp: 19  0 16  Height:      Weight:  161 lb (73.029 kg)    SpO2: 95%       GEN- The patient is elderly appearing, alert and oriented x 3 today.   HEENT: normocephalic, atraumatic; sclera clear, conjunctiva pink; hearing intact; oropharynx clear; neck supple  Lungs- Clear to ausculation bilaterally, normal work of breathing.  No wheezes, rales, rhonchi Heart- Regular rate and rhythm, 3/6 systolic murmur GI- soft, non-tender, non-distended, bowel sounds present  Extremities- no clubbing, cyanosis, or edema; DP/PT/radial pulses 2+ bilaterally MS- no significant deformity or atrophy Skin- warm and dry, scattered vitiligo, left chest without hematoma/ecchymosis Psych- euthymic mood, full affect Neuro- strength and sensation are intact   Labs:   Lab Results  Component Value Date   WBC 5.8 04/25/2015   HGB 14.6 04/25/2015   HCT 43.7 04/25/2015   MCV 92.9 04/25/2015   PLT 178.0 04/25/2015     Recent Labs Lab 04/25/15 1604  NA 136  K 4.3  CL 101  CO2 28  BUN 25*  CREATININE 1.19  CALCIUM 9.1  GLUCOSE 95    Discharge Medications:    Medication List    TAKE these medications        aspirin 81 MG tablet  Take 81 mg by mouth 2 (two) times daily.     atorvastatin 40 MG tablet  Commonly known as:  LIPITOR  Take 1 tablet (  40 mg total) by mouth daily at 6 PM.     carvedilol 3.125 MG tablet  Commonly known as:  COREG  Take 3.125 mg by mouth 2 (two) times daily with a meal.     citalopram 20 MG tablet  Commonly known as:  CELEXA  Take 20 mg by mouth daily.     digoxin 0.125 MG tablet  Commonly known as:  LANOXIN  Take 1 tablet (0.125 mg total) by mouth daily.     ferrous sulfate 325 (65 FE) MG tablet  Take 325 mg by mouth daily with breakfast.     furosemide 20 MG tablet  Commonly  known as:  LASIX  Take 1 tablet (20 mg total) by mouth every other day.     lisinopril 2.5 MG tablet  Commonly known as:  ZESTRIL  Take 1 tablet (2.5 mg total) by mouth daily.     omeprazole 20 MG capsule  Commonly known as:  PRILOSEC  Take 20 mg by mouth 2 (two) times daily.     polyethylene glycol powder powder  Commonly known as:  GLYCOLAX/MIRALAX  Take 17 g by mouth daily as needed for mild constipation.     potassium chloride 10 MEQ tablet  Commonly known as:  K-DUR  Take 10 mEq by mouth daily.     spironolactone 25 MG tablet  Commonly known as:  ALDACTONE  Take 0.5 tablets (12.5 mg total) by mouth daily.        Disposition:  Discharge Instructions    Diet - low sodium heart healthy    Complete by:  As directed      Increase activity slowly    Complete by:  As directed           Follow-up Information    Follow up with [facility] On 05/16/2015.   Why:  at [facility] information:   [address] [place] [place] [address]       Follow up with Thompson Grayer, MD On 07/25/2015.   Specialty:  Cardiology   Why:  at 12:30PM   Contact information:   [place] [address] [phone]       Duration of Discharge Encounter: Greater than 30 minutes including physician time.  Signed, [name], NP 05/02/2015 6:46 AM  Device interrogation is reviewed and normal.   Thompson Grayer MD, [facility] 05/02/2015 8:45 AM

## 2015-05-03 ENCOUNTER — Telehealth: Payer: Self-pay | Admitting: Internal Medicine

## 2015-05-03 NOTE — Telephone Encounter (Signed)
New problem   Pt need to speak to you concerning his surgery on Tuesday, he has questions. Please call

## 2015-05-03 NOTE — Telephone Encounter (Signed)
Spoke with patient and let him know the device team would instruct him how to use his transmitter once he came into office.

## 2015-05-16 ENCOUNTER — Encounter: Payer: Self-pay | Admitting: Internal Medicine

## 2015-05-16 ENCOUNTER — Ambulatory Visit (INDEPENDENT_AMBULATORY_CARE_PROVIDER_SITE_OTHER): Payer: Medicare Other | Admitting: *Deleted

## 2015-05-16 DIAGNOSIS — I519 Heart disease, unspecified: Secondary | ICD-10-CM | POA: Diagnosis not present

## 2015-05-16 DIAGNOSIS — I255 Ischemic cardiomyopathy: Secondary | ICD-10-CM

## 2015-05-16 DIAGNOSIS — I5022 Chronic systolic (congestive) heart failure: Secondary | ICD-10-CM

## 2015-05-16 LAB — CUP PACEART INCLINIC DEVICE CHECK
HighPow Impedance: 59 Ohm
Lead Channel Impedance Value: 437 Ohm
Lead Channel Pacing Threshold Amplitude: 0.5 V
Lead Channel Pacing Threshold Pulse Width: 0.4 ms
Lead Channel Setting Pacing Amplitude: 3.5 V
Lead Channel Setting Pacing Pulse Width: 0.4 ms
Lead Channel Setting Sensing Sensitivity: 0.3 mV
MDC IDC MSMT LEADCHNL RV SENSING INTR AMPL: 13.5 mV
MDC IDC SESS DTM: 20160921144816
MDC IDC SET ZONE DETECTION INTERVAL: 300 ms
MDC IDC SET ZONE DETECTION INTERVAL: 360 ms
MDC IDC STAT BRADY RV PERCENT PACED: 0.3 %
Zone Setting Detection Interval: 350 ms

## 2015-05-16 NOTE — Progress Notes (Signed)
Wound check appointment. Band aids removed (patient applied). Wound without redness or edema. Incision edges approximated, wound well healed. Normal device function. Threshold, sensing, and impedance consistent with implant measurements. Device programmed at 3.5V for extra safety margin until 3 month visit. Histogram distribution appropriate for patient and level of activity. (1) "AF" episode noted for irreg V-V----PVCs noted. No ventricular arrhythmias noted. Patient educated about wound care, arm mobility, lifting restrictions, shock plan. ROV in 3 months with JA.

## 2015-06-12 ENCOUNTER — Other Ambulatory Visit (HOSPITAL_COMMUNITY): Payer: Self-pay | Admitting: Cardiology

## 2015-07-05 ENCOUNTER — Other Ambulatory Visit: Payer: Self-pay | Admitting: Student

## 2015-07-05 DIAGNOSIS — Z8781 Personal history of (healed) traumatic fracture: Secondary | ICD-10-CM

## 2015-07-11 ENCOUNTER — Encounter
Admission: RE | Admit: 2015-07-11 | Discharge: 2015-07-11 | Disposition: A | Discharge status: Home / Self Care | Payer: Medicare Other | Source: Ambulatory Visit | Attending: Student | Admitting: Student

## 2015-07-11 DIAGNOSIS — Z8781 Personal history of (healed) traumatic fracture: Secondary | ICD-10-CM | POA: Insufficient documentation

## 2015-07-11 MED ORDER — TECHNETIUM TC 99M MEDRONATE IV KIT
25.0000 | PACK | Freq: Once | INTRAVENOUS | Status: AC | PRN
  Administered 2015-07-11: 23.21 via INTRAVENOUS

## 2015-07-13 ENCOUNTER — Inpatient Hospital Stay: Payer: Medicare Other | Attending: Oncology

## 2015-07-13 ENCOUNTER — Inpatient Hospital Stay (HOSPITAL_BASED_OUTPATIENT_CLINIC_OR_DEPARTMENT_OTHER): Payer: Medicare Other | Admitting: Oncology

## 2015-07-13 VITALS — BP 101/66 | HR 57 | Temp 97.2°F | Resp 16 | Wt 171.1 lb

## 2015-07-13 DIAGNOSIS — Z951 Presence of aortocoronary bypass graft: Secondary | ICD-10-CM | POA: Diagnosis not present

## 2015-07-13 DIAGNOSIS — K219 Gastro-esophageal reflux disease without esophagitis: Secondary | ICD-10-CM | POA: Insufficient documentation

## 2015-07-13 DIAGNOSIS — Z87891 Personal history of nicotine dependence: Secondary | ICD-10-CM | POA: Insufficient documentation

## 2015-07-13 DIAGNOSIS — Z79899 Other long term (current) drug therapy: Secondary | ICD-10-CM | POA: Insufficient documentation

## 2015-07-13 DIAGNOSIS — I255 Ischemic cardiomyopathy: Secondary | ICD-10-CM | POA: Insufficient documentation

## 2015-07-13 DIAGNOSIS — K3189 Other diseases of stomach and duodenum: Secondary | ICD-10-CM | POA: Diagnosis not present

## 2015-07-13 DIAGNOSIS — I2510 Atherosclerotic heart disease of native coronary artery without angina pectoris: Secondary | ICD-10-CM | POA: Insufficient documentation

## 2015-07-13 DIAGNOSIS — D509 Iron deficiency anemia, unspecified: Secondary | ICD-10-CM | POA: Insufficient documentation

## 2015-07-13 DIAGNOSIS — I252 Old myocardial infarction: Secondary | ICD-10-CM | POA: Diagnosis not present

## 2015-07-13 DIAGNOSIS — N289 Disorder of kidney and ureter, unspecified: Secondary | ICD-10-CM | POA: Insufficient documentation

## 2015-07-13 DIAGNOSIS — L409 Psoriasis, unspecified: Secondary | ICD-10-CM | POA: Diagnosis not present

## 2015-07-13 DIAGNOSIS — D696 Thrombocytopenia, unspecified: Secondary | ICD-10-CM | POA: Insufficient documentation

## 2015-07-13 DIAGNOSIS — Z7982 Long term (current) use of aspirin: Secondary | ICD-10-CM | POA: Diagnosis not present

## 2015-07-13 LAB — CBC WITH DIFFERENTIAL/PLATELET
BASOS ABS: 0 10*3/uL (ref 0–0.1)
Basophils Relative: 1 %
EOS ABS: 0.2 10*3/uL (ref 0–0.7)
EOS PCT: 3 %
HCT: 41.5 % (ref 40.0–52.0)
Hemoglobin: 13.8 g/dL (ref 13.0–18.0)
Lymphocytes Relative: 18 %
Lymphs Abs: 1.1 10*3/uL (ref 1.0–3.6)
MCH: 32.5 pg (ref 26.0–34.0)
MCHC: 33.2 g/dL (ref 32.0–36.0)
MCV: 97.8 fL (ref 80.0–100.0)
MONO ABS: 0.4 10*3/uL (ref 0.2–1.0)
Monocytes Relative: 7 %
Neutro Abs: 4.2 10*3/uL (ref 1.4–6.5)
Neutrophils Relative %: 71 %
PLATELETS: 126 10*3/uL — AB (ref 150–440)
RBC: 4.24 MIL/uL — AB (ref 4.40–5.90)
RDW: 14.4 % (ref 11.5–14.5)
WBC: 5.9 10*3/uL (ref 3.8–10.6)

## 2015-07-16 ENCOUNTER — Other Ambulatory Visit (HOSPITAL_COMMUNITY): Payer: Self-pay | Admitting: Cardiology

## 2015-07-25 ENCOUNTER — Ambulatory Visit (INDEPENDENT_AMBULATORY_CARE_PROVIDER_SITE_OTHER): Payer: Medicare Other | Admitting: Internal Medicine

## 2015-07-25 ENCOUNTER — Encounter: Payer: Self-pay | Admitting: Internal Medicine

## 2015-07-25 VITALS — BP 116/74 | HR 72 | Ht 72.0 in | Wt 172.2 lb

## 2015-07-25 DIAGNOSIS — I255 Ischemic cardiomyopathy: Secondary | ICD-10-CM | POA: Diagnosis not present

## 2015-07-25 DIAGNOSIS — I519 Heart disease, unspecified: Secondary | ICD-10-CM | POA: Diagnosis not present

## 2015-07-25 DIAGNOSIS — I5022 Chronic systolic (congestive) heart failure: Secondary | ICD-10-CM | POA: Diagnosis not present

## 2015-07-25 NOTE — Progress Notes (Signed)
Patient referred by [name] for ICM clinic.  ICM intro given during office visit and he agreed to monthly calls.  Patient letter sent with 1st scheduled ICM transmission date.

## 2015-07-25 NOTE — Progress Notes (Signed)
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 male who presents today for routine electrophysiology followup.  Since his recent ICD implantation, the patient reports doing very well.  Today, he denies symptoms of palpitations, chest pain, shortness of breath,  lower extremity edema, dizziness, presyncope, syncope, or ICD shocks.  The patient is otherwise without complaint today.   Past Medical History  Diagnosis Date  . Psoriasis   . Hearing loss   . Paronychia   . Vitiligo   . PUD (peptic ulcer disease)   . Leg fracture, left   . Paronychia   . Myocardial infarction ([place])   . Coronary artery disease   . GERD (gastroesophageal reflux disease)   . Arthritis   . External hemorrhoids   . Internal hemorrhoids   . HH (hiatus hernia)   . Schatzki's ring   . Brunner's gland hyperplasia of duodenum   . Tubular adenoma of colon   . Duodenitis   . Ischemic cardiomyopathy    Past Surgical History  Procedure Laterality Date  . Hernia repair    . Coronary artery bypass graft N/A 08/03/2014    Procedure: CORONARY ARTERY BYPASS GRAFTING (CABG);  Surgeon: [name], MD;  Location: [facility];  Service: Open Heart Surgery;  Laterality: N/A;  . Tee without cardioversion N/A 08/03/2014    Procedure: TRANSESOPHAGEAL ECHOCARDIOGRAM (TEE);  Surgeon: [name], MD;  Location: [facility];  Service: Open Heart Surgery;  Laterality: N/A;  . Esophagogastroduodenoscopy (egd) with propofol N/A 04/16/2015    Procedure: ESOPHAGOGASTRODUODENOSCOPY (EGD) WITH PROPOFOL;  Surgeon: [name], MD;  Location: [facility] ENDOSCOPY;  Service: Endoscopy;  Laterality: N/A;  . Ep implantable device N/A 05/01/2015    MDT ICD implanted for primary prevention of sudden death    ROS- all systems are reviewed and negative except as per HPI above  Current Outpatient Prescriptions  Medication Sig Dispense Refill  . aspirin 81 MG tablet Take 81 mg by mouth 2 (two) times daily.     Marland Kitchen atorvastatin (LIPITOR) 40 MG tablet Take 1 tablet (40 mg total) by mouth daily at 6 PM. 30 tablet 1  . carvedilol (COREG) 3.125 MG tablet Take 3.125 mg by mouth 2 (two) times daily with a meal.    . citalopram (CELEXA) 20 MG tablet TAKE ONE TABLET BY MOUTH EVERY DAY 30 tablet 6  . digoxin (LANOXIN) 0.125 MG tablet Take 1 tablet (0.125 mg total) by mouth daily. 30 tablet 1  . ferrous sulfate 325 (65 FE) MG tablet Take 325 mg by mouth daily with breakfast.    . furosemide (LASIX) 20 MG tablet Take 1 tablet (20 mg total) by mouth every other day. (Patient taking differently: Take 40 mg by mouth every other day. ) 30 tablet 3  . lisinopril (PRINIVIL,ZESTRIL) 2.5 MG tablet TAKE ONE TABLET BY MOUTH EVERY DAY 30 tablet 0  . omeprazole (PRILOSEC) 20 MG capsule Take 20 mg by mouth 2 (two) times daily.    . potassium chloride (K-DUR) 10 MEQ tablet Take 10 mEq by mouth daily.     Marland Kitchen spironolactone (ALDACTONE) 25 MG tablet Take 0.5 tablets (12.5 mg total) by mouth daily. 15 tablet 3   No current facility-administered medications for this visit.    Physical Exam: Filed Vitals:   07/25/15 1227  BP: 116/74  Pulse: 72  Height: 6' (1.829 m)  Weight: 172 lb 3.2 oz (78.109 kg)    GEN- The patient is well  appearing, alert and oriented x 3 today.   Head- normocephalic, atraumatic Eyes-  Sclera clear, conjunctiva pink Ears- hearing intact Oropharynx- clear Lungs- Clear to ausculation bilaterally, normal work of breathing Chest- ICD pocket is well healed Heart- Regular rate and rhythm, no murmurs, rubs or gallops, PMI not laterally displaced GI- soft, NT, ND, + BS Extremities- no clubbing, cyanosis, or edema  ICD interrogation- reviewed in detail today,  See PACEART report  Assessment and Plan:  1.  Chronic systolic dysfunction euvolemic today Stable on an appropriate medical regimen Normal ICD function See Pace Art report No changes today Will enroll in ICM device clinic  2. LA  enlargement ICD single chamber Visia AF algorithm suggests possibly 10 minutes of AF however I cannot exclude sinus with PACs/ PVCs.  He does have significant LA enlargement, so will continue to monitor and consider initiation of anticoagulation if this continues.  [facility]link Return to see EP NP in 1 year  Thompson Grayer MD, [facility] 07/25/2015 11:30 PM

## 2015-07-25 NOTE — Patient Instructions (Addendum)
Medication Instructions:  Your physician recommends that you continue on your current medications as directed. Please refer to the Current Medication list given to you today.   Labwork: None ordered   Testing/Procedures: None ordered   Follow-Up: Remote monitoring is used to monitor your ICD from home. This monitoring reduces the number of office visits required to check your device to one time per year. It allows [place] to keep an eye on the functioning of your device to ensure it is working properly. You are scheduled for a device check from home on 10/24/15. You may send your transmission at any time that day. If you have a wireless device, the transmission will be sent automatically. After your physician reviews your transmission, you will receive a postcard with your next transmission date.   Your physician wants you to follow-up in: 12 months with [place] will receive a reminder letter in the mail two months in advance. If you don't receive a letter, please call our office to schedule the follow-up appointment.  [name], RN will call you in regards to ICM.  Any Other Special Instructions Will Be Listed Below (If Applicable).     If you need a refill on your cardiac medications before your next appointment, please call your pharmacy.
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
## 2015-08-15 ENCOUNTER — Other Ambulatory Visit (HOSPITAL_COMMUNITY): Payer: Self-pay | Admitting: Cardiology

## 2015-08-16 LAB — CUP PACEART INCLINIC DEVICE CHECK
HIGH POWER IMPEDANCE MEASURED VALUE: 62 Ohm
Implantable Lead Location: 753860
Lead Channel Impedance Value: 380 Ohm
Lead Channel Impedance Value: 456 Ohm
Lead Channel Pacing Threshold Amplitude: 0.75 V
Lead Channel Pacing Threshold Pulse Width: 0.4 ms
MDC IDC LEAD IMPLANT DT: 20160906
MDC IDC MSMT BATTERY REMAINING LONGEVITY: 136 mo
MDC IDC MSMT BATTERY VOLTAGE: 3.1 V
MDC IDC MSMT LEADCHNL RV SENSING INTR AMPL: 16.625 mV
MDC IDC SESS DTM: 20161130183141
MDC IDC SET LEADCHNL RV PACING AMPLITUDE: 2.5 V
MDC IDC SET LEADCHNL RV PACING PULSEWIDTH: 0.4 ms
MDC IDC SET LEADCHNL RV SENSING SENSITIVITY: 0.3 mV
MDC IDC STAT BRADY RV PERCENT PACED: 0.16 %

## 2015-09-05 ENCOUNTER — Ambulatory Visit (INDEPENDENT_AMBULATORY_CARE_PROVIDER_SITE_OTHER): Payer: Medicare Other

## 2015-09-05 DIAGNOSIS — I5022 Chronic systolic (congestive) heart failure: Secondary | ICD-10-CM | POA: Diagnosis not present

## 2015-09-05 DIAGNOSIS — Z95810 Presence of automatic (implantable) cardiac defibrillator: Secondary | ICD-10-CM

## 2015-09-05 NOTE — Progress Notes (Signed)
EPIC Encounter for ICM Monitoring  Patient Name: Carl [name] is a 65 y.o. male Date: 09/05/2015 Primary Care Physican: [name], MD Primary Cardiologist: Dr [name] Electrophysiologist: Allred Dry Weight: 170 lbs       In the past month, have you:  1. Gained more than 2 pounds in a day or more than 5 pounds in a week? no  2. Had changes in your medications (with verification of current medications)? no  3. Had more shortness of breath than is usual for you? no  4. Limited your activity because of shortness of breath? no  5. Not been able to sleep because of shortness of breath? no  6. Had increased swelling in your feet or ankles? no  7. Had symptoms of dehydration (dizziness, dry mouth, increased thirst, decreased urine output) no  8. Had changes in sodium restriction? no  9. Been compliant with medication? Yes   ICM trend: 3 month view 08/30/2015  ICM trend: 1 year view 08/30/2015    Follow-up plan: ICM clinic phone appointment on 10/09/2015.  Optivol impedance below baseline 08/14/2016 to 08/27/2015 and then return to baseline 08/28/2015.  Patient denied any HF symptoms.  Encouraged him to follow low salt diet to decrease risk of fluid retention.  He stated he is doing fine at this time.   No changes today.   Copy of note sent to patient's primary care physician, primary cardiologist, and device following physician.  Rosalene Billings, RN, CCM 09/05/2015 10:43 AM

## 2015-10-09 ENCOUNTER — Ambulatory Visit (INDEPENDENT_AMBULATORY_CARE_PROVIDER_SITE_OTHER): Payer: Medicare Other

## 2015-10-09 DIAGNOSIS — I5022 Chronic systolic (congestive) heart failure: Secondary | ICD-10-CM | POA: Diagnosis not present

## 2015-10-09 DIAGNOSIS — Z95810 Presence of automatic (implantable) cardiac defibrillator: Secondary | ICD-10-CM | POA: Diagnosis not present

## 2015-10-09 NOTE — Progress Notes (Signed)
EPIC Encounter for ICM Monitoring  Patient Name: Carl [name] is a 65 y.o. male Date: 10/09/2015 Primary Care Physican: [name], MD Primary Cardiologist: [name] Electrophysiologist: Allred Dry Weight: 170 lbs       In the past month, have you:  1. Gained more than 2 pounds in a day or more than 5 pounds in a week? no  2. Had changes in your medications (with verification of current medications)? no  3. Had more shortness of breath than is usual for you? no  4. Limited your activity because of shortness of breath? no  5. Not been able to sleep because of shortness of breath? no  6. Had increased swelling in your feet or ankles? no  7. Had symptoms of dehydration (dizziness, dry mouth, increased thirst, decreased urine output) no  8. Had changes in sodium restriction? no  9. Been compliant with medication? Yes   ICM trend: 3 month view    ICM trend: 1 year view   Follow-up plan: ICM clinic phone appointment on 11/13/2015.  Optivol thoracic impedance trending along baseline.  He reported he is doing well and denied any fluid symptoms.  He reported some ankle pain.  Encouraged him to call for any fluid symptoms.  No changes today.   Copy of note sent to patient's primary care physician, primary cardiologist, and device following physician.  Rosalene Billings, RN, CCM 10/09/2015 3:00 PM

## 2015-11-13 ENCOUNTER — Ambulatory Visit (INDEPENDENT_AMBULATORY_CARE_PROVIDER_SITE_OTHER): Payer: Medicare Other | Admitting: *Deleted

## 2015-11-13 DIAGNOSIS — I5022 Chronic systolic (congestive) heart failure: Secondary | ICD-10-CM

## 2015-11-13 DIAGNOSIS — I255 Ischemic cardiomyopathy: Secondary | ICD-10-CM

## 2015-11-13 DIAGNOSIS — Z95810 Presence of automatic (implantable) cardiac defibrillator: Secondary | ICD-10-CM | POA: Diagnosis not present

## 2015-11-13 NOTE — Progress Notes (Signed)
EPIC Encounter for ICM Monitoring  Patient Name: Carl [name] is a 65 y.o. male Date: 11/13/2015 Primary Care Physican: [name], MD Primary Cardiologist: [name] Electrophysiologist: Allred Dry Weight: 170 lbs   In the past month, have you:  1. Gained more than 2 pounds in a day or more than 5 pounds in a week? no  2. Had changes in your medications (with verification of current medications)? no  3. Had more shortness of breath than is usual for you? no  4. Limited your activity because of shortness of breath? no  5. Not been able to sleep because of shortness of breath? no  6. Had increased swelling in your feet or ankles? no  7. Had symptoms of dehydration (dizziness, dry mouth, increased thirst, decreased urine output) no  8. Had changes in sodium restriction? no  9. Been compliant with medication? Yes   ICM trend: 3 month view for 11/13/2015   ICM trend: 1 year view for 11/13/2015   Follow-up plan: ICM clinic phone appointment on 12/18/2015.  Thoracic impedance trending close to reference line suggesting stable fluid levels except for 11/07/2015 to 11/08/2015 which suggest fluid accumulation.   Patient denied any symptoms.  Education given to limit sodium intake to < 2000 mg and fluid intake to 64 oz daily.  Encouraged to call for any fluid symptoms.  No changes today.    Copy of note sent to patient's primary care physician, primary cardiologist, and device following physician.  Rosalene Billings, RN, CCM 11/13/2015 1:55 PM

## 2015-11-14 NOTE — Progress Notes (Signed)
Remote ICD transmission.   

## 2015-12-13 LAB — CUP PACEART REMOTE DEVICE CHECK
Battery Remaining Longevity: 135 mo
Battery Voltage: 3.08 V
Date Time Interrogation Session: 20170321083824
HIGH POWER IMPEDANCE MEASURED VALUE: 60 Ohm
Lead Channel Impedance Value: 323 Ohm
Lead Channel Sensing Intrinsic Amplitude: 8.5 mV
Lead Channel Setting Pacing Amplitude: 2.5 V
Lead Channel Setting Pacing Pulse Width: 0.4 ms
Lead Channel Setting Sensing Sensitivity: 0.3 mV
MDC IDC LEAD IMPLANT DT: 20160906
MDC IDC LEAD LOCATION: 753860
MDC IDC MSMT LEADCHNL RV IMPEDANCE VALUE: 437 Ohm
MDC IDC MSMT LEADCHNL RV PACING THRESHOLD AMPLITUDE: 0.75 V
MDC IDC MSMT LEADCHNL RV PACING THRESHOLD PULSEWIDTH: 0.4 ms
MDC IDC MSMT LEADCHNL RV SENSING INTR AMPL: 8.5 mV
MDC IDC STAT BRADY RV PERCENT PACED: 0.07 %

## 2015-12-14 ENCOUNTER — Encounter: Payer: Self-pay | Admitting: Cardiology

## 2015-12-18 ENCOUNTER — Ambulatory Visit (INDEPENDENT_AMBULATORY_CARE_PROVIDER_SITE_OTHER): Payer: Medicare Other

## 2015-12-18 DIAGNOSIS — I5022 Chronic systolic (congestive) heart failure: Secondary | ICD-10-CM

## 2015-12-18 DIAGNOSIS — Z95810 Presence of automatic (implantable) cardiac defibrillator: Secondary | ICD-10-CM

## 2015-12-19 NOTE — Progress Notes (Signed)
EPIC Encounter for ICM Monitoring  Patient Name: Carl [name] is a 65 y.o. male Date: 12/19/2015 Primary Care Physican: [name], MD Primary Cardiologist: [name] Electrophysiologist: Allred Dry Weight: 165 lbs   In the past month, have you:  1. Gained more than 2 pounds in a day or more than 5 pounds in a week? no  2. Had changes in your medications (with verification of current medications)? no  3. Had more shortness of breath than is usual for you? no  4. Limited your activity because of shortness of breath? no  5. Not been able to sleep because of shortness of breath? no  6. Had increased swelling in your feet or ankles? Intermittent swelling of feet  7. Had symptoms of dehydration (dizziness, dry mouth, increased thirst, decreased urine output) no  8. Had changes in sodium restriction? no  9. Been compliant with medication? Yes  ICM trend: 3 month view for 12/18/2015  ICM trend: 1 year view for 12/18/2015  Follow-up plan: ICM clinic phone appointment 01/22/2016.    FLUID LEVELS: Optivol thoracic impedance decreased 12/10/2015 to 12/14/2015 and 12/15/2015 to 12/18/2015 suggesting fluid accumulation and close to returning to baseline.   SYMPTOMS: He has swelling of feet but resolves at night when lying down. Encouraged to call for any fluid symptoms.  EDUCATION: Limit sodium intake to < 2000 mg and fluid intake to 64 oz daily.   No changes today.    Rosalene Billings, RN, CCM 12/19/2015 11:07 AM

## 2015-12-28 ENCOUNTER — Encounter: Payer: Self-pay | Admitting: Cardiology

## 2016-01-22 ENCOUNTER — Ambulatory Visit (INDEPENDENT_AMBULATORY_CARE_PROVIDER_SITE_OTHER): Payer: Medicare Other

## 2016-01-22 DIAGNOSIS — Z95810 Presence of automatic (implantable) cardiac defibrillator: Secondary | ICD-10-CM

## 2016-01-22 DIAGNOSIS — I5022 Chronic systolic (congestive) heart failure: Secondary | ICD-10-CM | POA: Diagnosis not present

## 2016-01-22 NOTE — Progress Notes (Addendum)
EPIC Encounter for ICM Monitoring  Patient Name: Carl [name] is a 65 y.o. male Date: 01/22/2016 Primary Care Physican: [name], MD Primary Cardiologist: [name] Electrophysiologist: Allred Dry Weight: 166 lbs      In the past month, have you:  1. Gained more than 2 pounds in a day or more than 5 pounds in a week? no  2. Had changes in your medications (with verification of current medications)? no  3. Had more shortness of breath than is usual for you? no  4. Limited your activity because of shortness of breath? no  5. Not been able to sleep because of shortness of breath? no  6. Had increased swelling in your feet, ankles, legs or stomach area? no  7. Had symptoms of dehydration (dizziness, dry mouth, increased thirst, decreased urine output) no  8. Had changes in sodium restriction? no  9. Been compliant with medication? Yes  ICM trend: 3 month view for 01/22/2016  ICM trend: 1 year view for 01/22/2016  Follow-up plan: ICM clinic phone appointment 02/27/2016.    FLUID LEVELS:  Optivol thoracic impedance trending along baseline suggesting fluid levels are stable.    SYMPTOMS: None.  Denied any symptoms such as weight gain of 3 pounds overnight or 5 pounds within a week, SOB and/or lower extremity swelling. Encouraged to call for any fluid symptoms.   RECOMMENDATIONS: No changes today.     Rosalene Billings, RN, CCM 01/22/2016 2:56 PM

## 2016-02-12 ENCOUNTER — Other Ambulatory Visit: Payer: Self-pay

## 2016-02-12 MED ORDER — SPIRONOLACTONE 25 MG PO TABS: 12.5000 mg | ORAL_TABLET | Freq: Every day | ORAL | Status: DC

## 2016-02-15 ENCOUNTER — Telehealth: Payer: Self-pay | Admitting: Cardiology

## 2016-02-15 NOTE — Telephone Encounter (Signed)
New message        *STAT* If patient is at the pharmacy, call can be transferred to refill team.   1. Which medications need to be refilled? (please list name of each medication and dose if known) Citalopram 20 mg po daily  2. Which pharmacy/location (including street and city if local pharmacy) is medication to be sent to?[facility] [address]. Do they need a 30 day or 90 day supply? 30 day supply

## 2016-02-19 ENCOUNTER — Other Ambulatory Visit (HOSPITAL_COMMUNITY): Payer: Self-pay | Admitting: *Deleted

## 2016-02-19 MED ORDER — CITALOPRAM HYDROBROMIDE 20 MG PO TABS: 20.0000 mg | ORAL_TABLET | Freq: Every day | ORAL | Status: DC

## 2016-02-27 ENCOUNTER — Telehealth: Payer: Self-pay

## 2016-02-27 ENCOUNTER — Ambulatory Visit (INDEPENDENT_AMBULATORY_CARE_PROVIDER_SITE_OTHER): Payer: Medicare Other | Admitting: *Deleted

## 2016-02-27 DIAGNOSIS — I5022 Chronic systolic (congestive) heart failure: Secondary | ICD-10-CM

## 2016-02-27 DIAGNOSIS — Z95810 Presence of automatic (implantable) cardiac defibrillator: Secondary | ICD-10-CM

## 2016-02-27 DIAGNOSIS — I255 Ischemic cardiomyopathy: Secondary | ICD-10-CM

## 2016-02-27 NOTE — Telephone Encounter (Signed)
Remote ICM transmission received.  Attempted patient call and left message for return call.   

## 2016-02-27 NOTE — Progress Notes (Signed)
EPIC Encounter for ICM Monitoring  Patient Name: Carl [name] is a 65 y.o. male Date: 02/27/2016 Primary Care Physican: [name], MD Primary Cardiologist: [name] Electrophysiologist: Allred Dry Weight: unknown       Attempted call to patient and unable to reach.   Thoracic impedence suggesting fluid accumulation.  Repeat fluid level check on 03/10/2016 since unable to reach patient today.   Call to Dr Linton Ham office and requested faxed copy of latest labs.   ICM trend: 02/27/2016     Follow-up plan: ICM clinic phone appointment on 03/10/2016.  Copy of ICM check sent to primary cardiologist and device physician.   Rosalene Billings, RN 02/27/2016 11:48 AM

## 2016-02-27 NOTE — Telephone Encounter (Signed)
Spoke with office assistant at Dr Linton Ham office.  Requested faxed copy of latest labs for patient.  She stated she would have the nurse send a copy of latest labs and office note.  Provided fax number.

## 2016-02-27 NOTE — Progress Notes (Signed)
Remote ICD transmission.   

## 2016-02-28 NOTE — Progress Notes (Signed)
Received faxed copy of last office note 01/08/2016 and latest labs, from Dr [facility] office.   01/08/2016 lab results included Creatinine 1.3, Potassium 4.7, BUN 28, Sodium 136, eGFR 56.

## 2016-02-29 LAB — CUP PACEART REMOTE DEVICE CHECK
Battery Remaining Longevity: 134 mo
Battery Voltage: 3.05 V
Brady Statistic RV Percent Paced: 0.11 %
HighPow Impedance: 63 Ohm
Implantable Lead Implant Date: 20160906
Lead Channel Impedance Value: 437 Ohm
Lead Channel Pacing Threshold Pulse Width: 0.4 ms
Lead Channel Sensing Intrinsic Amplitude: 7.125 mV
Lead Channel Setting Pacing Amplitude: 2.5 V
Lead Channel Setting Pacing Pulse Width: 0.4 ms
Lead Channel Setting Sensing Sensitivity: 0.3 mV
MDC IDC LEAD LOCATION: 753860
MDC IDC MSMT LEADCHNL RV IMPEDANCE VALUE: 342 Ohm
MDC IDC MSMT LEADCHNL RV PACING THRESHOLD AMPLITUDE: 0.875 V
MDC IDC MSMT LEADCHNL RV SENSING INTR AMPL: 7.125 mV
MDC IDC SESS DTM: 20170703073522

## 2016-03-05 ENCOUNTER — Encounter: Payer: Self-pay | Admitting: Cardiology

## 2016-03-06 ENCOUNTER — Emergency Department
Admission: EM | Admit: 2016-03-06 | Discharge: 2016-03-06 | Disposition: A | Discharge status: Home / Self Care | Payer: Medicare Other | Attending: Emergency Medicine | Admitting: Emergency Medicine

## 2016-03-06 ENCOUNTER — Emergency Department: Payer: Medicare Other

## 2016-03-06 ENCOUNTER — Encounter: Payer: Self-pay | Admitting: Emergency Medicine

## 2016-03-06 DIAGNOSIS — I252 Old myocardial infarction: Secondary | ICD-10-CM | POA: Insufficient documentation

## 2016-03-06 DIAGNOSIS — M1990 Unspecified osteoarthritis, unspecified site: Secondary | ICD-10-CM | POA: Insufficient documentation

## 2016-03-06 DIAGNOSIS — Z87891 Personal history of nicotine dependence: Secondary | ICD-10-CM | POA: Diagnosis not present

## 2016-03-06 DIAGNOSIS — I2510 Atherosclerotic heart disease of native coronary artery without angina pectoris: Secondary | ICD-10-CM | POA: Insufficient documentation

## 2016-03-06 DIAGNOSIS — J189 Pneumonia, unspecified organism: Secondary | ICD-10-CM | POA: Diagnosis not present

## 2016-03-06 DIAGNOSIS — I5022 Chronic systolic (congestive) heart failure: Secondary | ICD-10-CM | POA: Insufficient documentation

## 2016-03-06 DIAGNOSIS — R0602 Shortness of breath: Secondary | ICD-10-CM | POA: Diagnosis present

## 2016-03-06 LAB — BASIC METABOLIC PANEL
Anion gap: 6 (ref 5–15)
BUN: 16 mg/dL (ref 6–20)
CALCIUM: 8.9 mg/dL (ref 8.9–10.3)
CO2: 28 mmol/L (ref 22–32)
CREATININE: 0.97 mg/dL (ref 0.61–1.24)
Chloride: 105 mmol/L (ref 101–111)
GFR calc Af Amer: >60 mL/min (ref >60)
GLUCOSE: 111 mg/dL — AB (ref 65–99)
Potassium: 3.6 mmol/L (ref 3.5–5.1)
Sodium: 139 mmol/L (ref 135–145)

## 2016-03-06 LAB — CBC
HCT: 40 % (ref 40.0–52.0)
Hemoglobin: 13.4 g/dL (ref 13.0–18.0)
MCH: 32.9 pg (ref 26.0–34.0)
MCHC: 33.5 g/dL (ref 32.0–36.0)
MCV: 98.4 fL (ref 80.0–100.0)
PLATELETS: 149 10*3/uL — AB (ref 150–440)
RBC: 4.06 MIL/uL — ABNORMAL LOW (ref 4.40–5.90)
RDW: 14.6 % — AB (ref 11.5–14.5)
WBC: 6 10*3/uL (ref 3.8–10.6)

## 2016-03-06 LAB — TROPONIN I: TROPONIN I: 0.04 ng/mL — AB (ref <0.03)

## 2016-03-06 MED ORDER — AMOXICILLIN-POT CLAVULANATE 875-125 MG PO TABS
1.0000 | ORAL_TABLET | Freq: Once | ORAL | Status: AC
  Administered 2016-03-06: 1 via ORAL
  Filled 2016-03-06: qty 1

## 2016-03-06 MED ORDER — ONDANSETRON 4 MG PO TBDP: 4.0000 mg | ORAL_TABLET | Freq: Three times a day (TID) | ORAL | Status: DC | PRN

## 2016-03-06 MED ORDER — AMOXICILLIN-POT CLAVULANATE 875-125 MG PO TABS: 1.0000 | ORAL_TABLET | Freq: Two times a day (BID) | ORAL | Status: DC

## 2016-03-06 NOTE — ED Notes (Signed)
Family complains of cough, shortness of breath, and discomfort in chest that started 2 weeks ago.

## 2016-03-06 NOTE — ED Provider Notes (Signed)
[facility] Emergency Department Provider Note  ____________________________________________  Time seen: 9:00 PM  I have reviewed the triage vital signs and the nursing notes.   HISTORY  Chief Complaint Cough and Shortness of Breath    HPI Carl [name] is a 65 y.o. male who complains of fatigue, nonproductive cough, shortness of breath and discomfort in the chest over the past 2 weeks. Gradual. Not exertional. Not pleuritic. No fever chills or sweats no recent illness. No trauma. No dizziness or syncope. Normal oral intake. Currently eating a dinner tray.     Past Medical History  Diagnosis Date  . Psoriasis   . Hearing loss   . Paronychia   . Vitiligo   . PUD (peptic ulcer disease)   . Leg fracture, left   . Paronychia   . Myocardial infarction ([place])   . Coronary artery disease   . GERD (gastroesophageal reflux disease)   . Arthritis   . External hemorrhoids   . Internal hemorrhoids   . HH (hiatus hernia)   . Schatzki's ring   . Brunner's gland hyperplasia of duodenum   . Tubular adenoma of colon   . Duodenitis   . Ischemic cardiomyopathy      Patient Active Problem List   Diagnosis Date Noted  . Chronic systolic dysfunction of left ventricle 05/01/2015  . Cardiomyopathy, ischemic 04/26/2015  . Difficulty hearing 12/29/2014  . Chronic systolic CHF (congestive heart failure) ([place]) 10/10/2014  . 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 Acute systolic heart failure ([place]) 08/01/2014  . Protein-calorie malnutrition, severe ([place]) 07/29/2014  . ST elevation myocardial infarction (STEMI) of anterolateral wall ([place]) 07/29/2014  . Cardiogenic shock ([place]) 07/29/2014  . Tobacco use 07/29/2014  . AKI (acute kidney injury) ([place]) 07/29/2014     Past Surgical History  Procedure Laterality Date  . Hernia repair    . Coronary artery bypass graft N/A 08/03/2014    Procedure: CORONARY ARTERY BYPASS GRAFTING (CABG);  Surgeon: [name], MD;  Location: [place];  Service: Open Heart Surgery;  Laterality: N/A;  . Tee without cardioversion N/A 08/03/2014    Procedure: TRANSESOPHAGEAL ECHOCARDIOGRAM (TEE);  Surgeon: [name], MD;  Location: [place];  Service: Open Heart Surgery;  Laterality: N/A;  . Esophagogastroduodenoscopy (egd) with propofol N/A 04/16/2015    Procedure: ESOPHAGOGASTRODUODENOSCOPY (EGD) WITH PROPOFOL;  Surgeon: [name], MD;  Location: [facility] ENDOSCOPY;  Service: Endoscopy;  Laterality: N/A;  . Ep implantable device N/A 05/01/2015    MDT ICD implanted for primary prevention of sudden death     Current Outpatient Rx  Name  Route  Sig  Dispense  Refill  . amoxicillin-clavulanate (AUGMENTIN) 875-125 MG tablet   Oral   Take 1 tablet by mouth 2 (two) times daily.   20 tablet   0   . aspirin 81 MG tablet   Oral   Take 81 mg by mouth 2 (two) times daily.         Marland Kitchen atorvastatin (LIPITOR) 40 MG tablet   Oral   Take 1 tablet (40 mg total) by mouth daily at 6 PM.   30 tablet   1   . carvedilol (COREG) 3.125 MG tablet   Oral   Take 3.125 mg by mouth 2 (two) times daily with a meal.         . citalopram (CELEXA) 20 MG tablet   Oral   Take 1 tablet (20 mg total) by mouth daily.   30 tablet  6   . digoxin (LANOXIN) 0.125 MG tablet   Oral   Take 1 tablet (0.125 mg total) by mouth daily.   30 tablet   1   . ferrous sulfate 325 (65 FE) MG tablet   Oral   Take 325 mg by mouth daily with breakfast.         . furosemide (LASIX) 20 MG tablet   Oral   Take 1 tablet (20 mg total) by mouth every other day. Patient taking differently: Take 40 mg by mouth every other day.    30 tablet   3   . lisinopril (PRINIVIL,ZESTRIL) 2.5 MG tablet      TAKE ONE TABLET BY MOUTH EVERY DAY   30 tablet   6   . omeprazole (PRILOSEC) 20 MG capsule   Oral   Take 20 mg by mouth 2 (two) times daily.         . ondansetron (ZOFRAN ODT) 4 MG disintegrating tablet   Oral   Take 1 tablet (4 mg total) by mouth every 8  (eight) hours as needed for nausea or vomiting.   20 tablet   0   . potassium chloride (K-DUR) 10 MEQ tablet   Oral   Take 10 mEq by mouth daily.          Marland Kitchen spironolactone (ALDACTONE) 25 MG tablet   Oral   Take 0.5 tablets (12.5 mg total) by mouth daily.   15 tablet   3     PLEASE DISREGARD PREVIOUS ORDER, THIS IS THE CORRE .Marland KitchenMarland Kitchen      Allergies Review of patient's allergies indicates no known allergies.   Family History  Problem Relation Age of Onset  . Valvular heart disease Mother     Ruptured valve  . CAD Father   . Heart Problems Brother     Stents x 4  . Diabetes Brother     Social History Social History  Substance Use Topics  . Smoking status: Former Research scientist (life sciences)  . Smokeless tobacco: Never Used  . Alcohol Use: No    Review of Systems  Constitutional:   No fever or chills.  ENT:   No sore throat. No rhinorrhea. Cardiovascular:   Positive as above chest pain. Respiratory:   Positive shortness of breath and nonproductive cough. Gastrointestinal:   Negative for abdominal pain, vomiting and diarrhea.  Genitourinary:   Negative for dysuria or difficulty urinating. Musculoskeletal:   Negative for focal pain or swelling Neurological:   Negative for headaches 10-point ROS otherwise negative.  ____________________________________________   PHYSICAL EXAM:  VITAL SIGNS: ED Triage Vitals  Enc Vitals Group     BP 03/06/16 1803 97/65 mmHg     Pulse Rate 03/06/16 1803 62     Resp 03/06/16 1803 18     Temp 03/06/16 1803 98.1 F (36.7 C)     Temp Source 03/06/16 1803 Oral     SpO2 03/06/16 1803 96 %     Weight 03/06/16 1803 171 lb (77.565 kg)     Height 03/06/16 1803 6' (1.829 m)     Head Cir --      Peak Flow --      Pain Score 03/06/16 1804 4     Pain Loc --      Pain Edu? --      Excl. in [place]? --     Vital signs reviewed, nursing assessments reviewed.   Constitutional:   Alert and oriented. Well appearing and in no distress. Eyes:  No scleral  icterus. No conjunctival pallor. PERRL. EOMI.  No nystagmus. ENT   Head:   Normocephalic and atraumatic.   Nose:   No congestion/rhinnorhea. No septal hematoma   Mouth/Throat:   MMM, no pharyngeal erythema. No peritonsillar mass.    Neck:   No stridor. No SubQ emphysema. No meningismus. Hematological/Lymphatic/Immunilogical:   No cervical lymphadenopathy. Cardiovascular:   RRR. Symmetric bilateral radial and DP pulses.  No murmurs.  Respiratory:   Normal respiratory effort without tachypnea nor retractions. Diminished breath sounds in the left base. Gastrointestinal:   Soft and nontender. Non distended. There is no CVA tenderness.  No rebound, rigidity, or guarding. Genitourinary:   deferred Musculoskeletal:   Nontender with normal range of motion in all extremities. No joint effusions.  No lower extremity tenderness.  No edema. Neurologic:   Normal speech and language.  CN 2-10 normal. Motor grossly intact. No gross focal neurologic deficits are appreciated.  Skin:    Skin is warm, dry and intact. No rash noted.  No petechiae, purpura, or bullae.  ____________________________________________    LABS (pertinent positives/negatives) (all labs ordered are listed, but only abnormal results are displayed) Labs Reviewed  BASIC METABOLIC PANEL - Abnormal; Notable for the following:    Glucose, Bld 111 (*)    All other components within normal limits  CBC - Abnormal; Notable for the following:    RBC 4.06 (*)    RDW 14.6 (*)    Platelets 149 (*)    All other components within normal limits  TROPONIN I - Abnormal; Notable for the following:    Troponin I 0.04 (*)    All other components within normal limits   ____________________________________________   EKG  Interpreted by me Normal sinus rhythm rate 64, normal axis and intervals. Poor R-wave progression in anterior precordial leads. Inferior Q waves. No acute ischemic changes. Nl ST segment T  waves  ____________________________________________    RADIOLOGY  Chest x-ray reveals a left base opacity consistent with pneumonia.  ____________________________________________   PROCEDURES   ____________________________________________   INITIAL IMPRESSION / ASSESSMENT AND PLAN / ED COURSE  Pertinent labs & imaging results that were available during my care of the patient were reviewed by me and considered in my medical decision making (see chart for details).  Patient well appearing no acute distress. Presents with cough shortness of breath and chest discomfort, found to have pneumonia.Considering the patient's symptoms, medical history, and physical examination today, I have low suspicion for ACS, PE, TAD, pneumothorax, carditis, mediastinitis, CHF, or sepsis. Slightly elevated troponin of 0.04 is not clinically significant, and likely remnant from prior cardiac disease as he has had a CABG in the past. Symptoms are not consistent with ACS PE or dissection.  Vital signs are normal. No white blood cell count. Normal creatinine. Eating and drinking normally. Suitable for outpatient follow-up on oral antibiotics. He'll follow-up in 3 or 4 days with primary care. Return precautions given.       ____________________________________________   FINAL CLINICAL IMPRESSION(S) / ED DIAGNOSES  Final diagnoses:  CAP (community acquired pneumonia)       Portions of this note were generated with dragon dictation software. Dictation errors may occur despite best attempts at proofreading.   Carrie Mew, MD 03/06/16 2222

## 2016-03-06 NOTE — Discharge Instructions (Signed)

## 2016-03-10 ENCOUNTER — Telehealth: Payer: Self-pay

## 2016-03-10 ENCOUNTER — Ambulatory Visit (INDEPENDENT_AMBULATORY_CARE_PROVIDER_SITE_OTHER): Payer: Medicare Other

## 2016-03-10 DIAGNOSIS — I5022 Chronic systolic (congestive) heart failure: Secondary | ICD-10-CM

## 2016-03-10 DIAGNOSIS — Z95810 Presence of automatic (implantable) cardiac defibrillator: Secondary | ICD-10-CM

## 2016-03-10 NOTE — Progress Notes (Signed)
EPIC Encounter for ICM Monitoring  Patient Name: Carl [name] is a 65 y.o. male Date: 03/10/2016 Primary Care Physican: [name], MD Primary Cardiologist: [name] Electrophysiologist: Allred Dry Weight: unknown        Attempted patient call x 3 and unable to reach.  Will attempt another call on 03/13/2016 and patient send updated transmission.   Thoracic impedence abnormal suggesting fluid accumulation since 02/23/2016 but is trending back toward baseline 03/04/2016.  LABS:  03/06/2016 Creatinine 0.97, BUN 16, Potassium 3.6, Sodium 139 04/25/2015 Creatinine 1.19, BUN 25, Potassium 4.3, Sodium 136    ICM trend: 03/10/2016    Follow-up plan: ICM clinic phone appointment on 03/13/2016.  Copy of ICM check sent to device physician.   Rosalene Billings, RN 03/10/2016 10:50 AM

## 2016-03-10 NOTE — Telephone Encounter (Signed)
Remote ICM transmission received.  Attempted patient call and wife answered phone.  She stated he was out mowing.

## 2016-03-10 NOTE — Telephone Encounter (Signed)
Attempted patient call on home and cell numbers.  No answer and no answering machine.

## 2016-03-11 NOTE — Telephone Encounter (Signed)
Attempted call to patient and person answering phone stated he was outside mowing.

## 2016-03-13 ENCOUNTER — Telehealth: Payer: Self-pay

## 2016-03-13 NOTE — Telephone Encounter (Signed)
Attempted ICM call to patient to request updated remote transmission. Left message on phone to return call.

## 2016-03-14 NOTE — Telephone Encounter (Signed)
Late entry for 03/13/2016.  Spoke with patient and requested to send manual transmission due to fluid accumulation showing on 03/10/2016.  He denied any fluid symptoms. Patient attempted to send manual transmission and unable to do so.  Provided tech support number and he stated he would call them.

## 2016-03-18 ENCOUNTER — Telehealth: Payer: Self-pay

## 2016-03-18 NOTE — Progress Notes (Signed)
No ICM remote transmission received as scheduled.  Next remote ICM scheduled for 04/03/2016.

## 2016-03-18 NOTE — Telephone Encounter (Signed)
ICM call to patient.  Asked if he spoke with [employer] tech services to assist him with the monitor problem.  He said he called and it should be working.  Requested he send a transmission today to check fluid levels.

## 2016-03-19 ENCOUNTER — Encounter: Payer: Self-pay | Admitting: Cardiology

## 2016-04-03 ENCOUNTER — Telehealth: Payer: Self-pay | Admitting: Cardiology

## 2016-04-03 NOTE — Telephone Encounter (Signed)
LMOVM reminding pt to send remote transmission.   

## 2016-04-09 NOTE — Progress Notes (Signed)
No ICM remote transmission received for 04/04/2016.  Next ICM remote scheduled for 04/22/2016.

## 2016-04-22 ENCOUNTER — Ambulatory Visit (INDEPENDENT_AMBULATORY_CARE_PROVIDER_SITE_OTHER): Payer: Medicare Other

## 2016-04-22 ENCOUNTER — Telehealth: Payer: Self-pay | Admitting: Cardiology

## 2016-04-22 DIAGNOSIS — Z95810 Presence of automatic (implantable) cardiac defibrillator: Secondary | ICD-10-CM

## 2016-04-22 DIAGNOSIS — I5022 Chronic systolic (congestive) heart failure: Secondary | ICD-10-CM

## 2016-04-22 NOTE — Telephone Encounter (Signed)
Spoke with pt and reminded pt of remote transmission that is due today. Pt verbalized understanding.   

## 2016-04-23 ENCOUNTER — Telehealth: Payer: Self-pay

## 2016-04-23 NOTE — Telephone Encounter (Signed)
Remote ICM transmission received.  Attempted patient call to home and cell number and no answer or answering machine.

## 2016-04-23 NOTE — Progress Notes (Signed)
EPIC Encounter for ICM Monitoring  Patient Name: Carl [name] is a 65 y.o. male Date: 04/23/2016 Primary Care Physican: [name], MD Primary Cardiologist: [name] Electrophysiologist: Allred Dry Weight:  unknown      Attempted ICM call and unable to reach.  Transmission reviewed.   Thoracic impedance abnormal suggesting fluid accumulation 03/27/2016 to 04/22/2016.  LABS: 03/06/2016 Creatinine 0.97, BUN 16, Potassium 3.6, Sodium 139   Follow-up plan: ICM clinic phone appointment on 05/12/2016.  Copy of ICM check sent to primary cardiologist and device physician.   ICM trend: 04/22/2016       Carl Billings, RN 04/23/2016 9:11 AM

## 2016-04-27 NOTE — Progress Notes (Signed)
Would increase Lasix to 40 mg daily and get appointment with me soon.

## 2016-05-05 ENCOUNTER — Encounter (HOSPITAL_COMMUNITY): Payer: Self-pay | Admitting: Vascular Surgery

## 2016-05-05 ENCOUNTER — Telehealth (HOSPITAL_COMMUNITY): Payer: Self-pay | Admitting: Vascular Surgery

## 2016-05-05 NOTE — Telephone Encounter (Signed)
Left pt message to make f/u appt 

## 2016-05-12 ENCOUNTER — Telehealth: Payer: Self-pay | Admitting: Cardiology

## 2016-05-12 NOTE — Telephone Encounter (Signed)
LMOVM reminding pt to send remote transmission.   

## 2016-05-16 NOTE — Progress Notes (Signed)
No ICM remote transmission received for 05/12/2016 and next ICM transmission scheduled for 05/28/2016.

## 2016-05-28 ENCOUNTER — Encounter: Payer: Medicare Other | Admitting: *Deleted

## 2016-05-28 ENCOUNTER — Telehealth: Payer: Self-pay | Admitting: Cardiology

## 2016-05-28 NOTE — Telephone Encounter (Signed)
LMOVM reminding pt to send remote transmission.   

## 2016-05-30 ENCOUNTER — Encounter: Payer: Self-pay | Admitting: Cardiology

## 2016-05-30 NOTE — Progress Notes (Signed)
No ICM remote transmission received for 05/28/2016 and next ICM transmission scheduled for 06/18/2016.

## 2016-06-10 ENCOUNTER — Ambulatory Visit
Admission: EM | Admit: 2016-06-10 | Discharge: 2016-06-10 | Disposition: A | Discharge status: Home / Self Care | Payer: Medicare Other | Attending: Family Medicine | Admitting: Family Medicine

## 2016-06-10 ENCOUNTER — Encounter (HOSPITAL_COMMUNITY): Payer: Self-pay | Admitting: Emergency Medicine

## 2016-06-10 ENCOUNTER — Observation Stay (HOSPITAL_COMMUNITY)
Admission: EM | Admit: 2016-06-10 | Discharge: 2016-06-12 | Disposition: A | Discharge status: Home / Self Care | Payer: Medicare Other | Attending: Internal Medicine | Admitting: Internal Medicine

## 2016-06-10 ENCOUNTER — Emergency Department (HOSPITAL_COMMUNITY): Payer: Medicare Other

## 2016-06-10 DIAGNOSIS — R531 Weakness: Secondary | ICD-10-CM

## 2016-06-10 DIAGNOSIS — N179 Acute kidney failure, unspecified: Secondary | ICD-10-CM | POA: Diagnosis not present

## 2016-06-10 DIAGNOSIS — Z950 Presence of cardiac pacemaker: Secondary | ICD-10-CM | POA: Diagnosis not present

## 2016-06-10 DIAGNOSIS — I5023 Acute on chronic systolic (congestive) heart failure: Principal | ICD-10-CM

## 2016-06-10 DIAGNOSIS — Z7982 Long term (current) use of aspirin: Secondary | ICD-10-CM | POA: Diagnosis not present

## 2016-06-10 DIAGNOSIS — I2510 Atherosclerotic heart disease of native coronary artery without angina pectoris: Secondary | ICD-10-CM | POA: Diagnosis not present

## 2016-06-10 DIAGNOSIS — M1990 Unspecified osteoarthritis, unspecified site: Secondary | ICD-10-CM | POA: Diagnosis not present

## 2016-06-10 DIAGNOSIS — Z87891 Personal history of nicotine dependence: Secondary | ICD-10-CM | POA: Diagnosis not present

## 2016-06-10 DIAGNOSIS — I340 Nonrheumatic mitral (valve) insufficiency: Secondary | ICD-10-CM | POA: Diagnosis not present

## 2016-06-10 DIAGNOSIS — I252 Old myocardial infarction: Secondary | ICD-10-CM | POA: Insufficient documentation

## 2016-06-10 DIAGNOSIS — Z8249 Family history of ischemic heart disease and other diseases of the circulatory system: Secondary | ICD-10-CM | POA: Diagnosis not present

## 2016-06-10 DIAGNOSIS — Z791 Long term (current) use of non-steroidal anti-inflammatories (NSAID): Secondary | ICD-10-CM | POA: Diagnosis not present

## 2016-06-10 DIAGNOSIS — I493 Ventricular premature depolarization: Secondary | ICD-10-CM | POA: Diagnosis not present

## 2016-06-10 DIAGNOSIS — R001 Bradycardia, unspecified: Secondary | ICD-10-CM

## 2016-06-10 DIAGNOSIS — K219 Gastro-esophageal reflux disease without esophagitis: Secondary | ICD-10-CM | POA: Insufficient documentation

## 2016-06-10 DIAGNOSIS — Z951 Presence of aortocoronary bypass graft: Secondary | ICD-10-CM | POA: Insufficient documentation

## 2016-06-10 DIAGNOSIS — I255 Ischemic cardiomyopathy: Secondary | ICD-10-CM | POA: Insufficient documentation

## 2016-06-10 DIAGNOSIS — R5383 Other fatigue: Secondary | ICD-10-CM | POA: Diagnosis present

## 2016-06-10 DIAGNOSIS — Z79899 Other long term (current) drug therapy: Secondary | ICD-10-CM | POA: Diagnosis not present

## 2016-06-10 DIAGNOSIS — I5022 Chronic systolic (congestive) heart failure: Secondary | ICD-10-CM

## 2016-06-10 HISTORY — DX: Heart failure, unspecified: I50.9

## 2016-06-10 LAB — MAGNESIUM: Magnesium: 2 mg/dL (ref 1.7–2.4)

## 2016-06-10 LAB — URINALYSIS, ROUTINE W REFLEX MICROSCOPIC
Bilirubin Urine: NEGATIVE
Glucose, UA: NEGATIVE mg/dL
Hgb urine dipstick: NEGATIVE
Ketones, ur: NEGATIVE mg/dL
Leukocytes, UA: NEGATIVE
NITRITE: NEGATIVE
PROTEIN: NEGATIVE mg/dL
SPECIFIC GRAVITY, URINE: 1.006 (ref 1.005–1.030)
pH: 5 (ref 5.0–8.0)

## 2016-06-10 LAB — COMPREHENSIVE METABOLIC PANEL
ALBUMIN: 4.1 g/dL (ref 3.5–5.0)
ALK PHOS: 102 U/L (ref 38–126)
ALT: 40 U/L (ref 17–63)
AST: 37 U/L (ref 15–41)
Anion gap: 11 (ref 5–15)
BUN: 46 mg/dL — AB (ref 6–20)
CALCIUM: 9.1 mg/dL (ref 8.9–10.3)
CHLORIDE: 104 mmol/L (ref 101–111)
CO2: 24 mmol/L (ref 22–32)
CREATININE: 2.28 mg/dL — AB (ref 0.61–1.24)
GFR calc non Af Amer: 29 mL/min — ABNORMAL LOW (ref >60)
GFR, EST AFRICAN AMERICAN: 33 mL/min — AB (ref >60)
GLUCOSE: 92 mg/dL (ref 65–99)
Potassium: 3.8 mmol/L (ref 3.5–5.1)
SODIUM: 139 mmol/L (ref 135–145)
Total Bilirubin: 1.4 mg/dL — ABNORMAL HIGH (ref 0.3–1.2)
Total Protein: 7 g/dL (ref 6.5–8.1)

## 2016-06-10 LAB — POC OCCULT BLOOD, ED: Fecal Occult Bld: NEGATIVE

## 2016-06-10 LAB — BRAIN NATRIURETIC PEPTIDE: B NATRIURETIC PEPTIDE 5: 835.4 pg/mL — AB (ref 0.0–100.0)

## 2016-06-10 LAB — I-STAT TROPONIN, ED: TROPONIN I, POC: 0.05 ng/mL (ref 0.00–0.08)

## 2016-06-10 LAB — CBC WITH DIFFERENTIAL/PLATELET
BASOS ABS: 0 10*3/uL (ref 0.0–0.1)
BASOS PCT: 0 %
EOS ABS: 0.1 10*3/uL (ref 0.0–0.7)
EOS PCT: 1 %
HCT: 41.1 % (ref 39.0–52.0)
HEMOGLOBIN: 14 g/dL (ref 13.0–17.0)
LYMPHS ABS: 1.2 10*3/uL (ref 0.7–4.0)
Lymphocytes Relative: 16 %
MCH: 32.4 pg (ref 26.0–34.0)
MCHC: 34.1 g/dL (ref 30.0–36.0)
MCV: 95.1 fL (ref 78.0–100.0)
Monocytes Absolute: 0.7 10*3/uL (ref 0.1–1.0)
Monocytes Relative: 10 %
NEUTROS PCT: 73 %
Neutro Abs: 5.2 10*3/uL (ref 1.7–7.7)
PLATELETS: 137 10*3/uL — AB (ref 150–400)
RBC: 4.32 MIL/uL (ref 4.22–5.81)
RDW: 13.9 % (ref 11.5–15.5)
WBC: 7.2 10*3/uL (ref 4.0–10.5)

## 2016-06-10 MED ORDER — ACETAMINOPHEN 650 MG RE SUPP: 650.0000 mg | Freq: Four times a day (QID) | RECTAL | Status: DC | PRN

## 2016-06-10 MED ORDER — HEPARIN SODIUM (PORCINE) 5000 UNIT/ML IJ SOLN
5000.0000 [IU] | Freq: Three times a day (TID) | INTRAMUSCULAR | Status: DC
  Administered 2016-06-10 – 2016-06-12 (×6): 5000 [IU] via SUBCUTANEOUS
  Filled 2016-06-10 (×6): qty 1

## 2016-06-10 MED ORDER — ASPIRIN EC 81 MG PO TBEC
81.0000 mg | DELAYED_RELEASE_TABLET | Freq: Two times a day (BID) | ORAL | Status: DC
  Administered 2016-06-10 – 2016-06-12 (×4): 81 mg via ORAL
  Filled 2016-06-10 (×4): qty 1

## 2016-06-10 MED ORDER — CARVEDILOL 3.125 MG PO TABS
3.1250 mg | ORAL_TABLET | Freq: Two times a day (BID) | ORAL | Status: DC
  Administered 2016-06-11 – 2016-06-12 (×4): 3.125 mg via ORAL
  Filled 2016-06-10 (×4): qty 1

## 2016-06-10 MED ORDER — SODIUM CHLORIDE 0.9 % IV BOLUS (SEPSIS)
1000.0000 mL | Freq: Once | INTRAVENOUS | Status: AC
  Administered 2016-06-10: 1000 mL via INTRAVENOUS

## 2016-06-10 MED ORDER — FUROSEMIDE 10 MG/ML IJ SOLN
40.0000 mg | Freq: Two times a day (BID) | INTRAMUSCULAR | Status: DC
  Administered 2016-06-10 – 2016-06-11 (×3): 40 mg via INTRAVENOUS
  Filled 2016-06-10 (×3): qty 4

## 2016-06-10 MED ORDER — FERROUS SULFATE 325 (65 FE) MG PO TABS
325.0000 mg | ORAL_TABLET | Freq: Every day | ORAL | Status: DC
  Administered 2016-06-11 – 2016-06-12 (×2): 325 mg via ORAL
  Filled 2016-06-10 (×2): qty 1

## 2016-06-10 MED ORDER — SODIUM CHLORIDE 0.9% FLUSH
3.0000 mL | Freq: Two times a day (BID) | INTRAVENOUS | Status: DC
  Administered 2016-06-10 – 2016-06-12 (×4): 3 mL via INTRAVENOUS

## 2016-06-10 MED ORDER — CITALOPRAM HYDROBROMIDE 20 MG PO TABS
20.0000 mg | ORAL_TABLET | Freq: Every day | ORAL | Status: DC
  Administered 2016-06-10 – 2016-06-12 (×3): 20 mg via ORAL
  Filled 2016-06-10 (×3): qty 1

## 2016-06-10 MED ORDER — ASPIRIN 81 MG PO CHEW
243.0000 mg | CHEWABLE_TABLET | Freq: Once | ORAL | Status: AC
  Administered 2016-06-10: 243 mg via ORAL

## 2016-06-10 MED ORDER — TRAMADOL HCL 50 MG PO TABS
50.0000 mg | ORAL_TABLET | Freq: Four times a day (QID) | ORAL | Status: DC | PRN
  Administered 2016-06-10 – 2016-06-11 (×2): 50 mg via ORAL
  Filled 2016-06-10 (×2): qty 1

## 2016-06-10 MED ORDER — SPIRONOLACTONE 25 MG PO TABS
12.5000 mg | ORAL_TABLET | Freq: Every day | ORAL | Status: DC
  Administered 2016-06-11: 12.5 mg via ORAL
  Filled 2016-06-10: qty 1

## 2016-06-10 MED ORDER — ATORVASTATIN CALCIUM 40 MG PO TABS
40.0000 mg | ORAL_TABLET | Freq: Every day | ORAL | Status: DC
  Administered 2016-06-11 – 2016-06-12 (×2): 40 mg via ORAL
  Filled 2016-06-10 (×2): qty 1

## 2016-06-10 MED ORDER — TIZANIDINE HCL 4 MG PO TABS
4.0000 mg | ORAL_TABLET | Freq: Four times a day (QID) | ORAL | Status: DC | PRN
  Filled 2016-06-10: qty 1

## 2016-06-10 MED ORDER — ACETAMINOPHEN 325 MG PO TABS: 650.0000 mg | ORAL_TABLET | Freq: Four times a day (QID) | ORAL | Status: DC | PRN

## 2016-06-10 MED ORDER — PANTOPRAZOLE SODIUM 40 MG PO TBEC
40.0000 mg | DELAYED_RELEASE_TABLET | Freq: Every day | ORAL | Status: DC
Start: 2016-06-11 — End: 2016-06-12
  Administered 2016-06-11 – 2016-06-12 (×2): 40 mg via ORAL
  Filled 2016-06-10 (×2): qty 1

## 2016-06-10 NOTE — Progress Notes (Signed)
New Admission Note:   Arrival Method: From ED by Stretcher Mental Orientation: AOx4 Telemetry: Box [phone] CCMD notified Assessment: Completed Skin: Intact IV: L Forearm Saline Locked Pain: no complaints of pain Tubes: None Safety Measures: Safety Fall Prevention Plan has been discussed  Admission:  2 [place] Orientation: Patient has been orientated to the room, unit and staff. :   Orders to be reviewed and implemented. Will continue to monitor the patient. Call light has been placed within reach and bed alarm has been activated. Attending Physician had been notified.    Isac Caddy, RN

## 2016-06-10 NOTE — ED Notes (Signed)
Attempted x 2.  

## 2016-06-10 NOTE — ED Triage Notes (Signed)
Per EMS: pt from [facility] c/o bradycardia with possible issue with AICD not working properly; pt given 324mg  ASA; pt noted irregular HR; IV 18g L FA

## 2016-06-10 NOTE — ED Notes (Signed)
Agricultural consultant at bedside to interrogate pt's PPM

## 2016-06-10 NOTE — Consult Note (Signed)
ELECTROPHYSIOLOGY CONSULT NOTE    Patient ID: Carl [name] MRN: [id number], DOB/AGE: September 27, 1950 65 y.o.  Admit date: 06/10/2016 Date of Consult: 06/10/2016  Primary Physician: [name], MD Primary Cardiologist: [name] Electrophysiologist: Rayann Heman Requesting MD: [place]  Reason for Consultation: PVC's on device interrogation  HPI:  Carl [name] is a 65 y.o. male with a past medical history significant for CAD s/p CABG, ICM s/p MDT single chamber ICD, and mild mental retardation. He was seen at [facility] today for evaluation of back pain and was found to have irregular slow pulse by pulse ox.  He was asked to go to the ER for further evaluation. Device interrogation in the ER demonstrated normal device function, normal histograms, PVC's of around 300/hour.  Optivol is elevated.  EP has been asked to evaluate device and PVC burden.   Echo 03/2015 demonstrated EF 25-30%, mild to moderate MR, LA moderately dilated.   He states that recently he has had a cold. He is unable to tell me for how long. He feels like he has been eating and drinking like normal but has probably been eating more salt than he usually does.  He reports recent subjective fever. He has not had diarrhea or abdominal pain.  He reports "staggering" recently when he tries to walk but is unable to better quantify this for me.  He has not had frank syncope.  He has also had increased LE edema.   Lab work is notable for Cr of 2.28 (baseline 0.9), BUN 46, BNP 835.    Past Medical History:  Diagnosis Date  . Arthritis   . Brunner's gland hyperplasia of duodenum   . Coronary artery disease   . Duodenitis   . External hemorrhoids   . GERD (gastroesophageal reflux disease)   . Hearing loss   . HH (hiatus hernia)   . Internal hemorrhoids   . Ischemic cardiomyopathy   . Leg fracture, left   . Myocardial infarction   . Paronychia   . Paronychia   . Psoriasis   . PUD (peptic ulcer disease)   . Schatzki's  ring   . Tubular adenoma of colon   . Vitiligo      Surgical History:  Past Surgical History:  Procedure Laterality Date  . CORONARY ARTERY BYPASS GRAFT N/A 08/03/2014   Procedure: CORONARY ARTERY BYPASS GRAFTING (CABG);  Surgeon: [name], MD;  Location: [facility];  Service: Open Heart Surgery;  Laterality: N/A;  . EP IMPLANTABLE DEVICE N/A 05/01/2015   MDT ICD implanted for primary prevention of sudden death  . ESOPHAGOGASTRODUODENOSCOPY (EGD) WITH PROPOFOL N/A 04/16/2015   Procedure: ESOPHAGOGASTRODUODENOSCOPY (EGD) WITH PROPOFOL;  Surgeon: [name], MD;  Location: [facility] ENDOSCOPY;  Service: Endoscopy;  Laterality: N/A;  . HERNIA REPAIR    . TEE WITHOUT CARDIOVERSION N/A 08/03/2014   Procedure: TRANSESOPHAGEAL ECHOCARDIOGRAM (TEE);  Surgeon: [name], MD;  Location: [place];  Service: Open Heart Surgery;  Laterality: N/A;     No current facility-administered medications for this encounter.   Current Outpatient Prescriptions:  .  amoxicillin-clavulanate (AUGMENTIN) 875-125 MG tablet, Take 1 tablet by mouth 2 (two) times daily., Disp: 20 tablet, Rfl: 0 .  aspirin 81 MG tablet, Take 81 mg by mouth 2 (two) times daily., Disp: , Rfl:  .  atorvastatin (LIPITOR) 40 MG tablet, Take 1 tablet (40 mg total) by mouth daily at 6 PM., Disp: 30 tablet, Rfl: 1 .  carvedilol (COREG) 3.125 MG tablet, Take 3.125 mg  by mouth 2 (two) times daily with a meal., Disp: , Rfl:  .  citalopram (CELEXA) 20 MG tablet, Take 1 tablet (20 mg total) by mouth daily., Disp: 30 tablet, Rfl: 6 .  clotrimazole-betamethasone (LOTRISONE) cream, Apply 1 application topically 2 (two) times daily., Disp: , Rfl:  .  digoxin (LANOXIN) 0.125 MG tablet, Take 1 tablet (0.125 mg total) by mouth daily., Disp: 30 tablet, Rfl: 1 .  ferrous sulfate 325 (65 FE) MG tablet, Take 325 mg by mouth daily with breakfast., Disp: , Rfl:  .  furosemide (LASIX) 20 MG tablet, Take 1 tablet (20 mg total) by mouth every other day.  (Patient taking differently: Take 40 mg by mouth every other day. ), Disp: 30 tablet, Rfl: 3 .  lisinopril (PRINIVIL,ZESTRIL) 2.5 MG tablet, TAKE ONE TABLET BY MOUTH EVERY DAY, Disp: 30 tablet, Rfl: 6 .  meloxicam (MOBIC) 15 MG tablet, Take 7.5 mg by mouth daily., Disp: , Rfl:  .  omeprazole (PRILOSEC) 20 MG capsule, Take 20 mg by mouth 2 (two) times daily., Disp: , Rfl:  .  ondansetron (ZOFRAN ODT) 4 MG disintegrating tablet, Take 1 tablet (4 mg total) by mouth every 8 (eight) hours as needed for nausea or vomiting., Disp: 20 tablet, Rfl: 0 .  potassium chloride (K-DUR) 10 MEQ tablet, Take 10 mEq by mouth daily. , Disp: , Rfl:  .  spironolactone (ALDACTONE) 25 MG tablet, Take 0.5 tablets (12.5 mg total) by mouth daily., Disp: 15 tablet, Rfl: 3 .  tiZANidine (ZANAFLEX) 4 MG tablet, Take 4 mg by mouth every 6 (six) hours as needed for muscle spasms., Disp: , Rfl:  .  traMADol (ULTRAM) 50 MG tablet, Take 50 mg by mouth every 6 (six) hours as needed., Disp: , Rfl:    Allergies: No Known Allergies  Social History   Social History  . Marital status: Single    Spouse name: N/A  . Number of children: N/A  . Years of education: N/A   Occupational History  . Not on file.   Social History Main Topics  . Smoking status: Former Research scientist (life sciences)  . Smokeless tobacco: Never Used  . Alcohol use No  . Drug use: No  . Sexual activity: No   Other Topics Concern  . Not on file   Social History Narrative   Pt lives in [place] with his mother.   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 Valvular heart disease Mother     Ruptured valve  . CAD Father   . Heart Problems Brother     Stents x 4  . Diabetes Brother      Review of Systems: All other systems reviewed and are otherwise negative except as noted above.  Physical Exam: Vitals:   06/10/16 1347 06/10/16 1400 06/10/16 1515  BP: 114/82 121/78 100/77  Resp: 15 26 20   Temp: 97.8 F (36.6  C)    TempSrc: Oral    SpO2: 100% 100% 95%    GEN- The patient is well appearing, alert and oriented x 3 today, +mild cognitive impairment.   HEENT: normocephalic, atraumatic; sclera clear, conjunctiva pink; hearing intact; oropharynx clear; neck supple  JVP to mandible Lungs- decreased BS at bases, normal work of breathing.  No wheezes, rales, rhonchi Heart- Regular rate and rhythm  GI- soft, non-tender, non-distended, bowel sounds present  Extremities- no clubbing, cyanosis, 1+BLE edema MS- no significant deformity or atrophy Skin- warm and dry, +  vitilago  Psych- euthymic mood, full affect Neuro- strength and sensation are intact  Labs:   Lab Results  Component Value Date   WBC 7.2 06/10/2016   HGB 14.0 06/10/2016   HCT 41.1 06/10/2016   MCV 95.1 06/10/2016   PLT 137 (L) 06/10/2016    Recent Labs Lab 06/10/16 1400  NA 139  K 3.8  CL 104  CO2 24  BUN 46*  CREATININE 2.28*  CALCIUM 9.1  PROT 7.0  BILITOT 1.4*  ALKPHOS 102  ALT 40  AST 37  GLUCOSE 92      Radiology/Studies: Dg Chest 2 View Result Date: 06/10/2016 CLINICAL DATA:  Bradycardia and cough EXAM: CHEST  2 VIEW COMPARISON:  03/06/2016 FINDINGS: Cardiac shadow remains enlarged. A defibrillator is again seen. The lungs are well aerated bilaterally with mild interstitial changes. No acute infiltrate is noted. Postsurgical changes are again seen. Small bilateral pleural effusions are seen likely of a chronic nature. No focal infiltrate is noted. IMPRESSION: Chronic small pleural effusions posteriorly. No acute infiltrate is noted. Electronically Signed   By: Inez Catalina M.D.   On: 06/10/2016 14:55    [id number] rhythm, rate 70, PVC   DEVICE HISTORY: MDT single chamber ICD implanted 04/2015 for primary prevention  Assessment/Plan: 1.  Bradycardia found at Urgent Care Almost certainly related to underperfusion of PVC's Histograms and device function normal  2.  PVC's Burden stable for the last year Will  update echo  Uptitrate BB as able  3.  ICM/chronic systolic heart failure Slightly volume overloaded on exam Stop Dig with AKI Update echo as above Continue BB Hold Lasix for now with AKI Will ask AHF team to see in am for medication recommendations   4.  AKI Hold Lasix, Dig, Lisinopril for now  [name] to see later today As above, will ask AHF team to see in am to re-establish care and make long term HF medication recommendations.   Signed, [name], NP 06/10/2016 3:25 PM  I have seen, examined the patient, and reviewed the above assessment and plan.  Changes to above are made where necessary.   On exam, volume overloaded with elevated JVP.  RRR with 3/6 SEM at the apex.   PVCs are the likely cause of his perceptively low HR.  Normal ICD function.  Narrow QRS and not a candidate for CRT.  Will need to be admitted for diuresis.  Would benefit from CHF service eval.  Repeat echo to evaluate MR which is prominent on exam.  Electrophysiology team to see as needed while here. Please call with questions.   Co Sign: Thompson Grayer, MD 06/10/2016 7:00 PM

## 2016-06-10 NOTE — ED Provider Notes (Signed)
[facility] ____________________________________________  Time seen: Approximately 12:26 PM  I have reviewed the triage vital signs and the nursing notes.   HISTORY  Chief Complaint Bradycardia   HPI Carl [name] is a 65 y.o. male presenting with mother at bedside from [facility] clinic orthopedics for concern of low heart rate. Patient reports he was at a standard follow-up appointment today for his continued back pain, in which he was discussed with him regarding having a lower heart rate. Patient reports that he has had some intermittent chest pain for the last 2 weeks as well as feeling tired for the past week. Patient reports minimal mid chest discomfort at this time. Denies any pain radiation or shortness of breath.  Reports occasional cough, reports pneumonia a few months ago. Denies fevers. Denies fall or injury. Denies any other changes or aggravating factors.  [name], MD: PCP Cardiology: Larence Penning   Past Medical History:  Diagnosis Date  . Arthritis   . Brunner's gland hyperplasia of duodenum   . Coronary artery disease   . External hemorrhoids   . GERD (gastroesophageal reflux disease)   . Hearing loss   . HH (hiatus hernia)   . Internal hemorrhoids   . Ischemic cardiomyopathy   . Leg fracture, left   . Myocardial infarction   . Paronychia   . Psoriasis   . PUD (peptic ulcer disease)   . Schatzki's ring   . Tubular adenoma of colon   . Vitiligo     Patient Active Problem List   Diagnosis Date Noted  . Bradycardia   . Premature ventricular contraction   . Systolic dysfunction with acute on chronic heart failure ([place])   . Non-rheumatic mitral regurgitation   . Chronic systolic dysfunction of left ventricle 05/01/2015  . Cardiomyopathy, ischemic 04/26/2015  . Difficulty hearing 12/29/2014  . Chronic systolic CHF (congestive heart failure) ([place]) 10/10/2014  . 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 Acute systolic heart failure ([place]) 08/01/2014  .  Protein-calorie malnutrition, severe ([place]) 07/29/2014  . ST elevation myocardial infarction (STEMI) of anterolateral wall ([place]) 07/29/2014  . Cardiogenic shock ([place]) 07/29/2014  . Tobacco use 07/29/2014  . AKI (acute kidney injury) ([place]) 07/29/2014    Past Surgical History:  Procedure Laterality Date  . CORONARY ARTERY BYPASS GRAFT N/A 08/03/2014   Procedure: CORONARY ARTERY BYPASS GRAFTING (CABG);  Surgeon: [name], MD;  Location: [place];  Service: Open Heart Surgery;  Laterality: N/A;  . EP IMPLANTABLE DEVICE N/A 05/01/2015   MDT ICD implanted for primary prevention of sudden death  . ESOPHAGOGASTRODUODENOSCOPY (EGD) WITH PROPOFOL N/A 04/16/2015   Procedure: ESOPHAGOGASTRODUODENOSCOPY (EGD) WITH PROPOFOL;  Surgeon: [name], MD;  Location: [facility] ENDOSCOPY;  Service: Endoscopy;  Laterality: N/A;  . HERNIA REPAIR    . TEE WITHOUT CARDIOVERSION N/A 08/03/2014   Procedure: TRANSESOPHAGEAL ECHOCARDIOGRAM (TEE);  Surgeon: [name], MD;  Location: [place];  Service: Open Heart Surgery;  Laterality: N/A;      No current facility-administered medications for this encounter.  No current outpatient prescriptions on file.  Facility-Administered Medications Ordered in Other Encounters:  .  acetaminophen (TYLENOL) tablet 650 mg, 650 mg, Oral, Q6H PRN **OR** acetaminophen (TYLENOL) suppository 650 mg, 650 mg, Rectal, Q6H PRN, Maryellen Pile, MD .  aspirin EC tablet 81 mg, 81 mg, Oral, BID, Maryellen Pile, MD .  Derrill Memo ON 06/11/2016] atorvastatin (LIPITOR) tablet 40 mg, 40 mg, Oral, q1800, Maryellen Pile, MD .  Derrill Memo ON 06/11/2016] carvedilol (COREG) tablet 3.125  mg, 3.125 mg, Oral, BID WC, Maryellen Pile, MD .  citalopram (CELEXA) tablet 20 mg, 20 mg, Oral, Daily, Maryellen Pile, MD .  Derrill Memo ON 06/11/2016] ferrous sulfate tablet 325 mg, 325 mg, Oral, Q breakfast, Maryellen Pile, MD .  furosemide (LASIX) injection 40 mg, 40 mg, Intravenous, BID, Maryellen Pile, MD .  heparin  injection 5,000 Units, 5,000 Units, Subcutaneous, Q8H, Maryellen Pile, MD .  Derrill Memo ON 06/11/2016] pantoprazole (PROTONIX) EC tablet 40 mg, 40 mg, Oral, Daily, Maryellen Pile, MD .  sodium chloride flush (NS) 0.9 % injection 3 mL, 3 mL, Intravenous, Q12H, Maryellen Pile, MD .  Derrill Memo ON 06/11/2016] spironolactone (ALDACTONE) tablet 12.5 mg, 12.5 mg, Oral, Daily, Maryellen Pile, MD .  tiZANidine (ZANAFLEX) tablet 4 mg, 4 mg, Oral, Q6H PRN, Maryellen Pile, MD .  traMADol Veatrice [place]bon) tablet 50 mg, 50 mg, Oral, Q6H PRN, Maryellen Pile, MD  Allergies Review of patient's allergies indicates no known allergies.  Family History  Problem Relation Age of Onset  . Valvular heart disease Mother     Ruptured valve  . CAD Father   . Heart Problems Brother     Stents x 4  . Diabetes Brother     Social History Social History  Substance Use Topics  . Smoking status: Former Research scientist (life sciences)  . Smokeless tobacco: Never Used  . Alcohol use No    Review of Systems Constitutional: No fever/chills Eyes: No visual changes. ENT: No sore throat. Cardiovascular: As above. Respiratory: Denies shortness of breath. Gastrointestinal: No abdominal pain.  No nausea, no vomiting.  No diarrhea.  No constipation. Genitourinary: Negative for dysuria. Musculoskeletal: Negative for back pain. Skin: Negative for rash. Neurological: Negative for headaches, focal weakness or numbness.  10-point ROS otherwise negative.  ____________________________________________   PHYSICAL EXAM:  VITAL SIGNS: ED Triage Vitals  Enc Vitals Group     BP 06/10/16 1208 (!) 105/56     Pulse Rate 06/10/16 1208 (!) 38     Resp 06/10/16 1208 15     Temp 06/10/16 1208 97.5 F (36.4 C)     Temp Source 06/10/16 1208 Oral     SpO2 06/10/16 1208 100 %     Weight --      Height --      Head Circumference --      Peak Flow --      Pain Score 06/10/16 1218 4     Pain Loc --      Pain Edu? --      Excl. in [place]? --     Constitutional:  Alert and oriented. Well appearing and in no acute distress. Eyes: Conjunctivae are normal. PERRL. EOMI. ENT      Head: Normocephalic and atraumatic.      Nose: No congestion/rhinnorhea.      Mouth/Throat: Mucous membranes are moist. Cardiovascular: Bradycardic. Pacemaker present.  Good peripheral circulation. Respiratory: Normal respiratory effort without tachypnea nor retractions. Breath sounds are clear and equal bilaterally. No wheezes/rales/rhonchi. Gastrointestinal: Soft and nontender. No distention.  Musculoskeletal: No midline cervical, thoracic or lumbar tenderness to palpation. Mild distal lower extremity edema bilaterally. Neurologic:  Normal speech and language. No gross focal neurologic deficits are appreciated. Speech is normal. No gait instability.  Skin:  Skin is warm, dry and intact. No rash noted. Psychiatric: Mood and affect are normal. Speech and behavior are normal. Patient exhibits appropriate insight and judgment   ___________________________________________   LABS (all labs ordered are listed, but only abnormal results are displayed)  Labs Reviewed -  No data to display ____________________________________________  EKG  ED ECG REPORT I, Marylene Land, the attending physician, personally viewed and interpreted this ECG.   Date: 06/10/2016  EKG Time: 1218  Rate: 70  Rhythm: Marked sinus bradycardia with frequent premature ventricular complexes.  Axis: normal  Intervals:none  ST&T Change: none  ____________________________________________  RADIOLOGY  Dg Chest 2 View  Result Date: 06/10/2016 CLINICAL DATA:  Bradycardia and cough EXAM: CHEST  2 VIEW COMPARISON:  03/06/2016 FINDINGS: Cardiac shadow remains enlarged. A defibrillator is again seen. The lungs are well aerated bilaterally with mild interstitial changes. No acute infiltrate is noted. Postsurgical changes are again seen. Small bilateral pleural effusions are seen likely of a chronic nature. No  focal infiltrate is noted. IMPRESSION: Chronic small pleural effusions posteriorly. No acute infiltrate is noted. Electronically Signed   By: Inez Catalina M.D.   On: 06/10/2016 14:55   ____________________________________________   PROCEDURES Procedures     INITIAL IMPRESSION / ASSESSMENT AND PLAN / ED COURSE  Pertinent labs & imaging results that were available during my care of the patient were reviewed by me and considered in my medical decision making (see chart for details).  Presenting for evaluation of bradycardia from outpatient orthopedic appointment. Patient noted to have heart rate of 38-40 when in urgent care with report of some chest pain and weakness. Patient with history of CABG, CHF and pacemaker. Patient further states that he had a missed a phone call from someone last week regarding needing to check his pacemaker, patient states he has been busy and has not returned phone call. Discussed this time with patient and mother has concern for symptomatic bradycardia recommended patient be further evaluated and seen emergency room at this time. EMS called for transport. Patient took one 81 mg aspirin this morning, 3 81 mg aspirin given in urgent care and patient placed on 2 L nasal cannula. Patient stable at time of transfer. Malachy Mood RN at [facility] called and report given.  Discussed follow up with Primary care physician this week. Discussed follow up and return parameters including no resolution or any worsening concerns. Patient verbalized understanding and agreed to plan.   ____________________________________________   FINAL CLINICAL IMPRESSION(S) / ED DIAGNOSES  Final diagnoses:  Symptomatic bradycardia  Weakness     Discharge Medication List as of 06/10/2016 12:45 PM      Note: This dictation was prepared with Dragon dictation along with smaller phrase technology. Any transcriptional errors that result from this process are unintentional.    Clinical Course        Marylene Land, NP 06/10/16 1924

## 2016-06-10 NOTE — H&P (Signed)
Date: 06/10/2016               Patient Name:  Carl [name] MRN: [id number]  DOB: 05-07-1951 Age / Sex: 65 y.o., male   PCP: [name], MD         Medical Service: Internal Medicine Teaching Service         Attending Physician: Dr. Aldine Contes, MD    First Contact: Dr. Ophelia Shoulder Pager: [phone]  Second Contact: Dr. Maryellen Pile Pager: [phone]       After Hours (After 5p/  First Contact Pager: [phone]  weekends / holidays): Second Contact Pager: [phone]   Chief Complaint: Fatigue  History of Present Illness: Carl [name] is a 65 year old gentleman with a past medical history of coronary artery disease status post CABG, ischemic cardiomyopathy, CHF with pacemaker, GERD and tobacco abuse who presents from his PCP office for lightheadedness, fatigue and cough. Per the patient he has had a one-week history of increasing fatigue with mild weakness. He also endorses a cough which is chronic in nature and has not worsened from baseline. He denies sputum production. Per information gathered from chart review the patient was referred to the emergency department by his PCP as he thought there was a problem with the patient's pacemaker. Cardiology was consulted and saw the patient in the emergency department. Device interrogation in the emergency room demonstrated normal device function, normal histograms, PVCs of around 300/hour. On further review of systems the patient denied headaches, chest pain, shortness of breath, nausea, vomiting or abdominal pain. He denies diarrhea or constipation. He denies fevers, chills or night sweats. He denies being around sick contacts. He also endorses some "staggering" when he walks but denies falls or syncope.  In the emergency department the patient was afebrile and hemodynamically stable. CBC was significant for thrombocytopenia of 137. CMP showed an elevated creatinine of 2.28 (baseline 1.10), elevated BUN of 46. Patient with an elevated BNP of  835.4. Urinalysis within normal limits. Chest x-ray showed chronic small pleural effusions posteriorly without acute infiltrate. The patient was then admitted to the [facility] internal medicine teaching service for further workup and management.    Meds:  No outpatient prescriptions have been marked as taking for the 06/10/16 encounter [facility] Encounter).     Allergies: Allergies as of 06/10/2016  . (No Known Allergies)   Past Medical History:  Diagnosis Date  . Arthritis   . Brunner's gland hyperplasia of duodenum   . Coronary artery disease   . External hemorrhoids   . GERD (gastroesophageal reflux disease)   . Hearing loss   . HH (hiatus hernia)   . Internal hemorrhoids   . Ischemic cardiomyopathy   . Leg fracture, left   . Myocardial infarction   . Paronychia   . Psoriasis   . PUD (peptic ulcer disease)   . Schatzki's ring   . Tubular adenoma of colon   . Vitiligo     Family History:  1. Mother-valvular heart disease 2. Father-coronary artery disease 3. Brother-diabetes, coronary artery disease  Social History:  Former smoker, denies illicit drug use  Review of Systems: A complete ROS was negative except as per HPI.   Physical Exam: Blood pressure 100/77, temperature 97.8 F (36.6 C), temperature source Oral, resp. rate 20, SpO2 95 %. Physical Exam  Constitutional: He is oriented to person, place, and time. He appears well-developed and well-nourished.  HENT:  Head: Normocephalic and atraumatic.  Neck:  Bilateral jugular venous distention  present to the mandibular angle  Cardiovascular: Normal rate and regular rhythm.  Exam reveals no gallop and no friction rub.   Murmur heard. Grade 2/6 systolic murmur appreciated best at the right upper sternal border  Respiratory: Effort normal and breath sounds normal. No respiratory distress. He has no wheezes.  GI: Soft. Bowel sounds are normal. He exhibits no distension. There is no tenderness.    Musculoskeletal: He exhibits no edema.  Neurological: He is oriented to person, place, and time.     EKG: See full report for details.  CXR: Chronic small pleural effusion posteriorly, no acute infiltrate is noted.  Assessment & Plan by Problem: Carl [name] is a 65 year old gentleman with a complicated past cardiac history who presents with a one-week history of fatigue with an elevated BNP and jugular venous distention on physical examination consistent with congestive heart failure exacerbation.  1. CHF exacerbation The patient presents with a one-week history of increased fatigue. BNP was elevated and the patient had bilateral jugular venous distention. Echocardiography on 03/29/2015 showed left ventricular ejection fraction of 25-30%. The etiology for the patient's congestive heart failure exacerbation is broad and unclear at this time. The most likely causes are increased salt intake or medication noncompliance. Heart failure has been made aware of the patient and will evaluate him tomorrow. -- Echocardiogram -- Lasix 40mg  IV -- Hold Digoxin -- Cardiac monitoring -- Atorvastatin 40 mg -- Carvedilol 3.125 mg twice a day -- Spironolactone 12.5 mg twice a day -- Strict I/Os -- Daily Weights   2. Acute kidney injury The patient's creatinine was elevated at presentation to 2.28. His baseline appears to be in the range of 0.97-1.19. Most likely cause of the patient's acute kidney injury is prerenal in etiology secondary to decreased renal perfusion and acute congestive heart failure exacerbation. The patient appears volume overloaded on examination I do not think fluids will improve the patient's acute kidney injury. We will continue gentle diuresis and monitor for improvement and creatinine tomorrow. -- Lasix as above  DVT/PE prophylaxis: Heparin FEN/GI: Normal diet   Dispo: Admit patient to Observation with expected length of stay less than 2 midnights.  Signed: Ophelia Shoulder,  MD 06/10/2016, 3:42 PM  Pager: [phone]

## 2016-06-10 NOTE — ED Triage Notes (Signed)
Patient states that he was at Dr. Blain Pais office today and his heart rate was in the 30's. Patient mother reports that patient has been having some dizziness, reports that patient does have some cognitive impairment.

## 2016-06-10 NOTE — ED Provider Notes (Signed)
[place] DEPT Provider Note   CSN: [id number] Arrival date & time: 06/10/16  1334     History   Chief Complaint Chief Complaint  Patient presents with  . Pacemaker Problem    HPI Carl [name] is a 65 y.o. male with a past medical history significant for coronary artery disease status post CABG, pacemaker, and GERD who presents from his PCP office for lightheadedness, fatigue, cough, and bradycardia. Patient reports that for the last few days, he has had some URI-like symptoms with some congestion and dry cough. He says that he has had some mild shortness of breath with this however his primary complaint was fatigue. He went to see his PCP today for the fatigue and lightheadedness and was found to be bradycardic in the 30s. Patient was subsequently transferred to this facility for further evaluation. Next  Patient denies any fevers or chills, denies any constipation, diarrhea, dysuria. He denies any palpitations or chest pain. He denies any trauma. He was given aspirin by EMS and they report his heart rate has been in the 70s.    The history is provided by the patient, medical records and the EMS personnel. No language interpreter was used.  Dizziness  Quality:  Lightheadedness Severity:  Moderate Onset quality:  Gradual Duration:  3 days Timing:  Constant Progression:  Worsening Chronicity:  New Relieved by:  Nothing Worsened by:  Nothing Associated symptoms: no blood in stool, no chest pain, no diarrhea, no headaches, no nausea, no palpitations, no shortness of breath, no syncope, no vision changes, no vomiting and no weakness   Risk factors: heart disease   Cough  This is a new problem. The current episode started more than 2 days ago. The problem occurs constantly. The problem has not changed since onset.The cough is non-productive. Pertinent negatives include no chest pain, no chills, no headaches, no shortness of breath and no wheezing.    Past Medical History:    Diagnosis Date  . Arthritis   . Brunner's gland hyperplasia of duodenum   . Coronary artery disease   . Duodenitis   . External hemorrhoids   . GERD (gastroesophageal reflux disease)   . Hearing loss   . HH (hiatus hernia)   . Internal hemorrhoids   . Ischemic cardiomyopathy   . Leg fracture, left   . Myocardial infarction   . Paronychia   . Paronychia   . Psoriasis   . PUD (peptic ulcer disease)   . Schatzki's ring   . Tubular adenoma of colon   . Vitiligo     Patient Active Problem List   Diagnosis Date Noted  . Chronic systolic dysfunction of left ventricle 05/01/2015  . Cardiomyopathy, ischemic 04/26/2015  . Difficulty hearing 12/29/2014  . Chronic systolic CHF (congestive heart failure) ([place]) 10/10/2014  . 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 Acute systolic heart failure ([place]) 08/01/2014  . Protein-calorie malnutrition, severe ([place]) 07/29/2014  . ST elevation myocardial infarction (STEMI) of anterolateral wall ([place]) 07/29/2014  . Cardiogenic shock ([place]) 07/29/2014  . Tobacco use 07/29/2014  . AKI (acute kidney injury) ([place]) 07/29/2014    Past Surgical History:  Procedure Laterality Date  . CORONARY ARTERY BYPASS GRAFT N/A 08/03/2014   Procedure: CORONARY ARTERY BYPASS GRAFTING (CABG);  Surgeon: [name], MD;  Location: [place];  Service: Open Heart Surgery;  Laterality: N/A;  . EP IMPLANTABLE DEVICE N/A 05/01/2015   MDT ICD implanted for primary prevention of sudden death  . ESOPHAGOGASTRODUODENOSCOPY (EGD) WITH PROPOFOL  N/A 04/16/2015   Procedure: ESOPHAGOGASTRODUODENOSCOPY (EGD) WITH PROPOFOL;  Surgeon: [name], MD;  Location[facility] ENDOSCOPY;  Service: Endoscopy;  Laterality: N/A;  . HERNIA REPAIR    . TEE WITHOUT CARDIOVERSION N/A 08/03/2014   Procedure: TRANSESOPHAGEAL ECHOCARDIOGRAM (TEE);  Surgeon: [name], MD;  Location: [place];  Service: Open Heart Surgery;  Laterality: N/A;       Home Medications    Prior to Admission medications    Medication Sig Start Date End Date Taking? Authorizing Provider  amoxicillin-clavulanate (AUGMENTIN) 875-125 MG tablet Take 1 tablet by mouth 2 (two) times daily. 03/06/16   Carrie Mew, MD  aspirin 81 MG tablet Take 81 mg by mouth 2 (two) times daily.    Historical Provider, MD  atorvastatin (LIPITOR) 40 MG tablet Take 1 tablet (40 mg total) by mouth daily at 6 PM. 08/15/14   [name] Gold, PA-C  carvedilol (COREG) 3.125 MG tablet Take 3.125 mg by mouth 2 (two) times daily with a meal.    Historical Provider, MD  citalopram (CELEXA) 20 MG tablet Take 1 tablet (20 mg total) by mouth daily. 02/19/16   [name], MD  clotrimazole-betamethasone (LOTRISONE) cream Apply 1 application topically 2 (two) times daily.    Historical Provider, MD  digoxin (LANOXIN) 0.125 MG tablet Take 1 tablet (0.125 mg total) by mouth daily. 08/15/14   [name] Gold, PA-C  ferrous sulfate 325 (65 FE) MG tablet Take 325 mg by mouth daily with breakfast.    Historical Provider, MD  furosemide (LASIX) 20 MG tablet Take 1 tablet (20 mg total) by mouth every other day. Patient taking differently: Take 40 mg by mouth every other day.  03/29/15   [name], MD  lisinopril (PRINIVIL,ZESTRIL) 2.5 MG tablet TAKE ONE TABLET BY MOUTH EVERY DAY 08/16/15   [name], MD  meloxicam (MOBIC) 15 MG tablet Take 7.5 mg by mouth daily.    Historical Provider, MD  omeprazole (PRILOSEC) 20 MG capsule Take 20 mg by mouth 2 (two) times daily.    Historical Provider, MD  ondansetron (ZOFRAN ODT) 4 MG disintegrating tablet Take 1 tablet (4 mg total) by mouth every 8 (eight) hours as needed for nausea or vomiting. 03/06/16   Carrie Mew, MD  potassium chloride (K-DUR) 10 MEQ tablet Take 10 mEq by mouth daily.  01/30/15   Historical Provider, MD  spironolactone (ALDACTONE) 25 MG tablet Take 0.5 tablets (12.5 mg total) by mouth daily. 02/12/16   [name], MD  tiZANidine (ZANAFLEX) 4 MG tablet Take 4 mg by mouth every 6 (six)  hours as needed for muscle spasms.    Historical Provider, MD  traMADol (ULTRAM) 50 MG tablet Take 50 mg by mouth every 6 (six) hours as needed.    Historical Provider, MD    Family History Family History  Problem Relation Age of Onset  . Valvular heart disease Mother     Ruptured valve  . CAD Father   . Heart Problems Brother     Stents x 4  . Diabetes Brother     Social History Social History  Substance Use Topics  . Smoking status: Former Research scientist (life sciences)  . Smokeless tobacco: Never Used  . Alcohol use No     Allergies   Review of patient's allergies indicates no known allergies.   Review of Systems Review of Systems  Constitutional: Positive for fatigue. Negative for chills, diaphoresis and fever.  HENT: Positive for congestion.   Respiratory: Positive  for cough. Negative for chest tightness, shortness of breath, wheezing and stridor.   Cardiovascular: Positive for leg swelling. Negative for chest pain, palpitations and syncope.  Gastrointestinal: Negative for blood in stool, constipation, diarrhea, nausea and vomiting.  Genitourinary: Negative for dysuria and flank pain.  Musculoskeletal: Negative for back pain, neck pain and neck stiffness.  Skin: Negative for rash and wound.  Neurological: Positive for dizziness and light-headedness. Negative for seizures, syncope, facial asymmetry, weakness, numbness and headaches.  Psychiatric/Behavioral: Negative for agitation.  All other systems reviewed and are negative.    Physical Exam Updated Vital Signs BP 114/82 (BP Location: Right Arm)   Temp 97.8 F (36.6 C) (Oral)   Resp 15   SpO2 100%   Physical Exam  Constitutional: He is oriented to person, place, and time. He appears well-developed and well-nourished.  HENT:  Head: Normocephalic and atraumatic.  Mouth/Throat: Oropharynx is clear and moist. No oropharyngeal exudate.  Eyes: Conjunctivae and EOM are normal. Pupils are equal, round, and reactive to light.  Neck:  Normal range of motion. Neck supple.  Cardiovascular: Normal rate and regular rhythm.   No murmur heard. Pulmonary/Chest: Effort normal and breath sounds normal. No stridor. No respiratory distress. He has no wheezes. He has no rales. He exhibits no tenderness.  Abdominal: Soft. There is no tenderness.  Genitourinary: Rectal exam shows guaiac negative stool.  Musculoskeletal: He exhibits edema. He exhibits no tenderness.  Neurological: He is alert and oriented to person, place, and time. He has normal reflexes. He displays normal reflexes. No cranial nerve deficit. He exhibits normal muscle tone. Coordination normal.  Skin: Skin is warm and dry. Capillary refill takes less than 2 seconds. No pallor.  Psychiatric: He has a normal mood and affect.  Nursing note and vitals reviewed.    ED Treatments / Results  Labs (all labs ordered are listed, but only abnormal results are displayed) Labs Reviewed  CBC WITH DIFFERENTIAL/PLATELET - Abnormal; Notable for the following:       Result Value   Platelets 137 (*)    All other components within normal limits  COMPREHENSIVE METABOLIC PANEL - Abnormal; Notable for the following:    BUN 46 (*)    Creatinine, Ser 2.28 (*)    Total Bilirubin 1.4 (*)    GFR calc non Af Amer 29 (*)    GFR calc Af Amer 33 (*)    All other components within normal limits  BRAIN NATRIURETIC PEPTIDE - Abnormal; Notable for the following:    B Natriuretic Peptide 835.4 (*)    All other components within normal limits  MAGNESIUM  URINALYSIS, ROUTINE W REFLEX MICROSCOPIC (NOT AT [facility])  I-STAT TROPOININ, ED  POC OCCULT BLOOD, ED  Randolm Idol, ED    EKG  EKG Interpretation  Date/Time:  Tuesday June 10 2016 13:43:41 EDT Ventricular Rate:  70 PR Interval:    QRS Duration: 112 QT Interval:  444 QTC Calculation: 480 R Axis:   63 Text Interpretation:  Sinus rhythm Ventricular trigeminy Left atrial enlargement Abnormal lateral Q waves Anterior infarct, old  Confirmed by [facility] MD, JASON [phone]) on 06/10/2016 3:22:53 PM       Radiology Dg Chest 2 View  Result Date: 06/10/2016 CLINICAL DATA:  Bradycardia and cough EXAM: CHEST  2 VIEW COMPARISON:  03/06/2016 FINDINGS: Cardiac shadow remains enlarged. A defibrillator is again seen. The lungs are well aerated bilaterally with mild interstitial changes. No acute infiltrate is noted. Postsurgical changes are again seen. Small bilateral pleural effusions  are seen likely of a chronic nature. No focal infiltrate is noted. IMPRESSION: Chronic small pleural effusions posteriorly. No acute infiltrate is noted. Electronically Signed   By: Inez Catalina M.D.   On: 06/10/2016 14:55    Procedures Procedures (including critical care time)  Medications Ordered in ED Medications  sodium chloride 0.9 % bolus 1,000 mL (0 mLs Intravenous Stopped 06/10/16 1610)     Initial Impression / Assessment and Plan / ED Course  I have reviewed the triage vital signs and the nursing notes.  Pertinent labs & imaging results that were available during my care of the patient were reviewed by me and considered in my medical decision making (see chart for details).  Clinical Course    Carl [name] is a 65 y.o. male with a past medical history significant for coronary artery disease status post CABG, pacemaker, and GERD who presents from his PCP office for lightheadedness, fatigue, cough, and bradycardia. Patient received aspirin with EMS.  History and exam are seen above.  Patient did not have any bradycardia after being to telemetry. Patient was having some PVCs were not felt on radial pulse. Suspect heart rate obtained at primary physician's office was by pulse ox rather than EKG.  Exam significant for bilateral lower extremity edema. Lungs are clear, and was nontender and chest nontender. Patient is hard of hearing but had normal mentation.  Diagnostic laboratory testing showed elevated BNP, Negative troponin, Normal  urinalysis, And elevated creatinine. Patient has evidence of AKI.   Chest x-ray showed mild pulmonary effusions but no evidence of pneumonia. EKG showed sinus rhythm with trigeminy and no evidence of ischemia.  Pacemaker was interrogated. According to pacemaker service, patient has had numerous PVCs (366 in last hour). They report no other arrhythmias and report that his beats are paced only 0.2% of the time.   Due to the acute kidney injury, fatigue, and concern for some worsening Fluid overload with elevated BNP, patient will be admitted to internal medicine service for further management. Cardiology will evaluate the patient and provide management recommendations in regards to pacemaker.  Patient admitted in stable condition.   Final Clinical Impressions(s) / ED Diagnoses   Final diagnoses:  Bradycardia  AKI (acute kidney injury) ([place])  Other fatigue    New Prescriptions Current Discharge Medication List      Clinical Impression: 1. Bradycardia   2. AKI (acute kidney injury) ([place])   3. Other fatigue     Disposition: Admit to Internal Medicine Service    Courtney Paris, MD 06/10/16 2009

## 2016-06-10 NOTE — ED Notes (Signed)
Attempted report x1. 

## 2016-06-11 ENCOUNTER — Encounter (HOSPITAL_COMMUNITY): Payer: Self-pay | Admitting: General Practice

## 2016-06-11 DIAGNOSIS — N179 Acute kidney failure, unspecified: Secondary | ICD-10-CM

## 2016-06-11 DIAGNOSIS — I2510 Atherosclerotic heart disease of native coronary artery without angina pectoris: Secondary | ICD-10-CM | POA: Diagnosis not present

## 2016-06-11 DIAGNOSIS — Z950 Presence of cardiac pacemaker: Secondary | ICD-10-CM

## 2016-06-11 DIAGNOSIS — I5021 Acute systolic (congestive) heart failure: Secondary | ICD-10-CM | POA: Diagnosis not present

## 2016-06-11 DIAGNOSIS — I255 Ischemic cardiomyopathy: Secondary | ICD-10-CM | POA: Diagnosis not present

## 2016-06-11 DIAGNOSIS — I5023 Acute on chronic systolic (congestive) heart failure: Secondary | ICD-10-CM | POA: Diagnosis not present

## 2016-06-11 DIAGNOSIS — Z951 Presence of aortocoronary bypass graft: Secondary | ICD-10-CM

## 2016-06-11 LAB — BASIC METABOLIC PANEL
Anion gap: 9 (ref 5–15)
BUN: 40 mg/dL — ABNORMAL HIGH (ref 6–20)
CALCIUM: 8.8 mg/dL — AB (ref 8.9–10.3)
CHLORIDE: 102 mmol/L (ref 101–111)
CO2: 27 mmol/L (ref 22–32)
CREATININE: 1.95 mg/dL — AB (ref 0.61–1.24)
GFR calc Af Amer: 40 mL/min — ABNORMAL LOW (ref >60)
GFR calc non Af Amer: 35 mL/min — ABNORMAL LOW (ref >60)
GLUCOSE: 108 mg/dL — AB (ref 65–99)
Potassium: 3.4 mmol/L — ABNORMAL LOW (ref 3.5–5.1)
Sodium: 138 mmol/L (ref 135–145)

## 2016-06-11 MED ORDER — POTASSIUM CHLORIDE CRYS ER 20 MEQ PO TBCR
40.0000 meq | EXTENDED_RELEASE_TABLET | Freq: Once | ORAL | Status: AC
  Administered 2016-06-11: 40 meq via ORAL
  Filled 2016-06-11: qty 2

## 2016-06-11 MED ORDER — POTASSIUM CHLORIDE CRYS ER 20 MEQ PO TBCR: 40.0000 meq | EXTENDED_RELEASE_TABLET | Freq: Once | ORAL | Status: DC

## 2016-06-11 NOTE — Consult Note (Signed)
   [facility] CM Inpatient Consult   06/11/2016  ATREYA TERHAAR 1950-09-19 [id number]   [facility] Care Management referral received from inpatient RNCM. Made aware that patient's mother should be contacted to discuss [place] Management program per patient's request.  Telephone call made to Niklaus Direnzo (mom) at [phone]. No answer. Called on [phone] (home phone) and left generic voicemail message to request call back. Per inpatient RNCM patient lives with mother.  Will leave [facility] Care Management brochure and contact information at bedside. Will make inpatient RNCM aware of writer's attempt to reach Mr. Ganguly mother to offer and discuss [place] Management program services.  Marthenia Rolling, MSN-Ed, RN,BSN [facility] Liaison [phone]

## 2016-06-11 NOTE — Consult Note (Signed)
Advanced Heart Failure Team Consult Note  Referring Physician: [name]   Primary Physician: Dr Cherlyn Labella Primary Cardiologist: Dr [name]    Reason for Consultation: Heart Failure   HPI:    Carl Carl [name] is a 65 year old with a history of smoking, CAD, S/P CABG x5 2015 , mild retardation, chronic systolic HF, and depression. Post operative course after CABG was complicated due to difficulty weaning pressors and ended up on midodrine. He has not been seen by Dr [name] since 2016.   Evaluated at Urgent Care for back pain  and was found to have irregular slow pulse by pulse ox.  He was asked to go to the ER for further evaluation. Device interrogation in the ER demonstrated normal device function, normal histograms, PVC's of around 300/hour.  On admit ;pertinent labs include: creatinine 2.28, troponin 0.04, K 3.6, Hgb 13.4 and BNP 835. CXR with small effusions. Optivol was elevated.  EP consulted. Not a candidate for CRT upgrade with narrow QRS. He has been diuresing with IV lasix. Brisk diuresis noted.   EP consulted HF team for volume overload and worsening renal function. Creatinine baseline 1-1.3. Prior to admit he was taking dig and lisinopril.  Dig and lisinopril stopped on admit.   Today he denies SOB. Complains of back pain.     ECHO 2016 EF 25-30%.    Review of Systems: [y] = yes, [ ]  = no   General: Weight gain [ ] ; Weight loss [ ] ; Anorexia [ ] ; Fatigue [ ] ; Fever [ ] ; Chills [ ] ; Weakness [Y ]  Cardiac: Chest pain/pressure [ ] ; Resting SOB [ ] ; Exertional SOB [ ] ; Orthopnea [ ] ; Pedal Edema [ ] ; Palpitations [ ] ; Syncope [ ] ; Presyncope [ ] ; Paroxysmal nocturnal dyspnea[ ]   Pulmonary: Cough [ ] ; Wheezing[ ] ; Hemoptysis[ ] ; Sputum [ ] ; Snoring [ ]   GI: Vomiting[ ] ; Dysphagia[ ] ; Melena[ ] ; Hematochezia [ ] ; Heartburn[ ] ; Abdominal pain [ ] ; Constipation [ ] ; Diarrhea [ ] ; BRBPR [ ]   GU: Hematuria[ ] ; Dysuria [ ] ; Nocturia[ ]   Vascular: Pain in legs with walking [ ] ; Pain in feet  with lying flat [ ] ; Non-healing sores [ ] ; Stroke [ ] ; TIA [ ] ; Slurred speech [ ] ;  Neuro: Headaches[ ] ; Vertigo[ ] ; Seizures[ ] ; Paresthesias[ ] ;Blurred vision [ ] ; Diplopia [ ] ; Vision changes [ ]   Ortho/Skin: Arthritis [ ] ; Joint pain [Y ]; Muscle pain [ ] ; Joint swelling [ ] ; Back Pain [ Y]; Rash [ ]   Psych: Depression[ ] ; Anxiety[ ]   Heme: Bleeding problems [ ] ; Clotting disorders [ ] ; Anemia [ ]   Endocrine: Diabetes [ ] ; Thyroid dysfunction[ ]   Home Medications Prior to Admission medications   Medication Sig Start Date End Date Taking? Authorizing Provider  aspirin 81 MG tablet Take 81 mg by mouth 2 (two) times daily.   Yes Historical Provider, MD  atorvastatin (LIPITOR) 40 MG tablet Take 1 tablet (40 mg total) by mouth daily at 6 PM. 08/15/14  Yes [name] Gold, PA-C  carvedilol (COREG) 3.125 MG tablet Take 3.125 mg by mouth 2 (two) times daily with a meal.   Yes Historical Provider, MD  ferrous sulfate 325 (65 FE) MG tablet Take 325 mg by mouth daily with breakfast.   Yes Historical Provider, MD  furosemide (LASIX) 20 MG tablet Take 1 tablet (20 mg total) by mouth every other day. Patient taking differently: Take 40 mg by mouth daily.  03/29/15  Yes [name], MD  lisinopril (PRINIVIL,ZESTRIL) 2.5 MG tablet TAKE ONE TABLET BY MOUTH EVERY DAY 08/16/15  Yes [name], MD  omeprazole (PRILOSEC) 20 MG capsule Take 20 mg by mouth 2 (two) times daily.   Yes Historical Provider, MD  spironolactone (ALDACTONE) 25 MG tablet Take 0.5 tablets (12.5 mg total) by mouth daily. Patient taking differently: Take 25 mg by mouth daily.  02/12/16  Yes [name], MD  tiZANidine (ZANAFLEX) 4 MG tablet Take 4 mg by mouth every 6 (six) hours as needed for muscle spasms.   Yes Historical Provider, MD  traMADol (ULTRAM) 50 MG tablet Take 50 mg by mouth every 6 (six) hours as needed.   Yes Historical Provider, MD  amoxicillin-clavulanate (AUGMENTIN) 875-125 MG tablet Take 1 tablet by mouth 2 (two)  times daily. Patient not taking: Reported on 06/10/2016 03/06/16   Carrie Mew, MD  citalopram (CELEXA) 20 MG tablet Take 1 tablet (20 mg total) by mouth daily. Patient not taking: Reported on 06/10/2016 02/19/16   [name], MD  digoxin (LANOXIN) 0.125 MG tablet Take 1 tablet (0.125 mg total) by mouth daily. Patient not taking: Reported on 06/10/2016 08/15/14   Wilder Glade Gold, PA-C  ondansetron (ZOFRAN ODT) 4 MG disintegrating tablet Take 1 tablet (4 mg total) by mouth every 8 (eight) hours as needed for nausea or vomiting. Patient not taking: Reported on 06/10/2016 03/06/16   Carrie Mew, MD    Past Medical History: Past Medical History:  Diagnosis Date  . Arthritis   . Brunner's gland hyperplasia of duodenum   . Coronary artery disease   . External hemorrhoids   . GERD (gastroesophageal reflux disease)   . Hearing loss   . HH (hiatus hernia)   . Internal hemorrhoids   . Ischemic cardiomyopathy   . Leg fracture, left   . Myocardial infarction   . Paronychia   . Psoriasis   . PUD (peptic ulcer disease)   . Schatzki's ring   . Tubular adenoma of colon   . Vitiligo     Past Surgical History: Past Surgical History:  Procedure Laterality Date  . CORONARY ARTERY BYPASS GRAFT N/A 08/03/2014   Procedure: CORONARY ARTERY BYPASS GRAFTING (CABG);  Surgeon: [name], MD;  Location: [facility];  Service: Open Heart Surgery;  Laterality: N/A;  . EP IMPLANTABLE DEVICE N/A 05/01/2015   MDT ICD implanted for primary prevention of sudden death  . ESOPHAGOGASTRODUODENOSCOPY (EGD) WITH PROPOFOL N/A 04/16/2015   Procedure: ESOPHAGOGASTRODUODENOSCOPY (EGD) WITH PROPOFOL;  Surgeon: [name], MD;  Location: [facility] ENDOSCOPY;  Service: Endoscopy;  Laterality: N/A;  . HERNIA REPAIR    . TEE WITHOUT CARDIOVERSION N/A 08/03/2014   Procedure: TRANSESOPHAGEAL ECHOCARDIOGRAM (TEE);  Surgeon: [name], MD;  Location: [place] [facility];  Service: Open Heart Surgery;  Laterality: N/A;     Family History: Family History  Problem Relation Age of Onset  . Valvular heart disease Mother     Ruptured valve  . CAD Father   . Heart Problems Brother     Stents x 4  . Diabetes Brother     Social History: Social History   Social History  . Marital status: Single    Spouse name: N/A  . Number of children: N/A  . Years of education: N/A   Social History Main Topics  . Smoking status: Former Research scientist (life sciences)  . Smokeless tobacco: Never Used  . Alcohol use No  . Drug use: No  . Sexual activity: No   Other Topics  Concern  . None   Social History Narrative   Pt lives in [place] with his mother.   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)    Allergies:  No Known Allergies  Objective:    Vital Signs:   Temp:  [97.8 F (36.6 C)-98.5 F (36.9 C)] 98.5 F (36.9 C) (10/18 0513) Pulse Rate:  [73-79] 73 (10/18 0513) Resp:  [15-31] 17 (10/18 0513) BP: (95-124)/(48-102) 95/63 (10/18 0513) SpO2:  [95 %-100 %] 95 % (10/18 0513) Weight:  [165 lb 14.4 oz (75.3 kg)] 165 lb 14.4 oz (75.3 kg) (10/18 0513) Last BM Date: 06/10/16  Weight change: Filed Weights   06/11/16 0513  Weight: 165 lb 14.4 oz (75.3 kg)    Intake/Output:   Intake/Output Summary (Last 24 hours) at 06/11/16 1019 Last data filed at 06/11/16 0936  Gross per 24 hour  Intake                0 ml  Output             3000 ml  Net            -3000 ml     Physical Exam: General:  Well appearing. No resp difficulty. In the chair HEENT: normal Neck: supple. JVP to jaw . Carotids 2+ bilat; no bruits. No lymphadenopathy or thryomegaly appreciated. Cor: PMI nondisplaced. Regular rate & rhythm. No rubs, gallop. 3/6 HSM at apex. L upper chest ICD Lungs: clear Abdomen: soft, nontender, nondistended. No hepatosplenomegaly. No bruits or masses. Good bowel sounds. Extremities: no cyanosis, clubbing, rash, edema Neuro: alert & orientedx3, cranial nerves grossly intact. moves all 4 extremities w/o difficulty. Affect  pleasant  Telemetry: NSR 70s   Labs: Basic Metabolic Panel:  Recent Labs Lab 06/10/16 1400 06/11/16 0246  NA 139 138  K 3.8 3.4*  CL 104 102  CO2 24 27  GLUCOSE 92 108*  BUN 46* 40*  CREATININE 2.28* 1.95*  CALCIUM 9.1 8.8*  MG 2.0  --     Liver Function Tests:  Recent Labs Lab 06/10/16 1400  AST 37  ALT 40  ALKPHOS 102  BILITOT 1.4*  PROT 7.0  ALBUMIN 4.1   No results for input(s): LIPASE, AMYLASE in the last 168 hours. No results for input(s): AMMONIA in the last 168 hours.  CBC:  Recent Labs Lab 06/10/16 1400  WBC 7.2  NEUTROABS 5.2  HGB 14.0  HCT 41.1  MCV 95.1  PLT 137*    Cardiac Enzymes: No results for input(s): CKTOTAL, CKMB, CKMBINDEX, TROPONINI in the last 168 hours.  BNP: BNP (last 3 results)  Recent Labs  06/10/16 1402  BNP 835.4*    ProBNP (last 3 results) No results for input(s): PROBNP in the last 8760 hours.   CBG: No results for input(s): GLUCAP in the last 168 hours.  Coagulation Studies: No results for input(s): LABPROT, INR in the last 72 hours.  Other results: EKG: Sinus Rhythm PVC 70 Imaging: Dg Chest 2 View  Result Date: 06/10/2016 CLINICAL DATA:  Bradycardia and cough EXAM: CHEST  2 VIEW COMPARISON:  03/06/2016 FINDINGS: Cardiac shadow remains enlarged. A defibrillator is again seen. The lungs are well aerated bilaterally with mild interstitial changes. No acute infiltrate is noted. Postsurgical changes are again seen. Small bilateral pleural effusions are seen likely of a chronic nature. No focal infiltrate is noted. IMPRESSION: Chronic small pleural effusions posteriorly. No acute infiltrate is noted. Electronically Signed   By: Inez Catalina M.D.   On: 06/10/2016 14:55  Medications:     Current Medications: . aspirin EC  81 mg Oral BID  . atorvastatin  40 mg Oral q1800  . carvedilol  3.125 mg Oral BID WC  . citalopram  20 mg Oral Daily  . ferrous sulfate  325 mg Oral Q breakfast  . furosemide  40  mg Intravenous BID  . heparin  5,000 Units Subcutaneous Q8H  . pantoprazole  40 mg Oral Daily  . sodium chloride flush  3 mL Intravenous Q12H  . spironolactone  12.5 mg Oral Daily     Infusions:      Assessment/Plan/Discussion  Carl [name] is a 65 year old with mild retardation, ICM (CABGx5 2015) and chronic systolic heart failure admitted with volume overload and bradycardia-->PVCs. He is well known to HF team and has been followed by Dr [name]. EP consulted HF team for volume overload and AKI noted on admit.   1. A/C Systolic Heart Failure: Ischemic cardiomyopathy. Has Medtronic ICD. Narrow QRS.  On exam he is warm and wet so will diurese for now.  May need PICC to check CO-OX but dont think low output.  Volume status elevated. Continue IV lasix 40 mg twice daily. Supplement K.  Continue low dose carvedilol 3.125 mg twice a day.  Stop spiro with elevated creatinine. Keep off ace. ECHO ordered to look at [facility] and MV. Most recent 2016 EF 25-30%. If EF down will need to consider RHC/LHC. Has not had CP but he is a poor historian. Troponin on admit 0.04.  2.PVCs- EP following.  3. AKI- baseline creatinine 1-1.3. Creatinine on admit 2.28. Todays Creatinine 1.95. Check renal ultrasound. 4. CAD- S/P CABGx5.- on statin, aspirin, and bb 5. Back pain, chronic  Length of Stay: 0  Amy Clegg  NP-C 06/11/2016, 10:19 AM  Advanced Heart Failure Team Pager [phone] (M-F; 7a - 4p)  Please contact [facility] for night-coverage after hours (4p -7a ) and weekends on [email or web address]  Patient seen with NP, agree with the above note.  1. Acute on chronic systolic CHF: Ischemic cardiomyopathy.  Medtronic ICD.  Prior echo with EF 25-30%.  On exam, he is volume overloaded.  Also presented with AKI.   - Hold ACEI and digoxin for now.  - Diuresing well with Laxis 40 mg IV bid, continue.  - Continue Coreg 3.125 mg bid.  - Repeat echo.  2. CAD: s/p CABG.  No chest pain.  Continue ASA 81 and statin.  Slight  increase in troponin is likely due to demand ischemia.  3. AKI: ?Etiology.  He is volume overloaded on exam.  Renal ultrasound ordered.  Creatinine has come down a bit with diuresis.  4. Murmur: Patient has Carl murmur on exam. Awaiting echo.  5. PVCs: Continue beta blocker.   [name] 06/11/2016

## 2016-06-11 NOTE — Progress Notes (Signed)
   Subjective: No acute events overnight. Patient has no complaints this morning. He is doing better.  Objective:  Vital signs in last 24 hours: Vitals:   06/10/16 1700 06/10/16 1745 06/10/16 2123 06/11/16 0513  BP: (!) 124/102 110/74 (!) 108/48 95/63  Pulse:  78 75 73  Resp:   16 17  Temp:  98 F (36.7 C) 97.8 F (36.6 C) 98.5 F (36.9 C)  TempSrc:  Oral Oral Oral  SpO2: 96% 100% 98% 95%  Weight:    165 lb 14.4 oz (75.3 kg)   Physical Exam  Constitutional: He is oriented to person, place, and time. He appears well-developed and well-nourished.  HENT:  Head: Normocephalic and atraumatic.  Neck:  JVD improved  Cardiovascular: Normal rate and regular rhythm.  Exam reveals no gallop and no friction rub.   Murmur heard. 2/6 systolic murmur  Respiratory: Effort normal and breath sounds normal.  GI: Soft. Bowel sounds are normal. He exhibits no distension. There is no tenderness.  Neurological: He is alert and oriented to person, place, and time.  Skin:  Diffuse vitiligo     Assessment/Plan: Carl [name] is a 65 year old gentleman with a complicated past cardiac history who presents with a one-week history of fatigue with an elevated BNP and jugular venous distention on physical examination consistent with congestive heart failure exacerbation.  In summary, patient has improved. We will give  an additional 40 mg IV Lasix. He'll have an echocardiogram today. Once he is seen and evaluated by cardiology we can prepare for discharge either this evening or tomorrow.  1. CHF exacerbation The patient presents with a one-week history of increased fatigue. BNP was elevated and the patient had bilateral jugular venous distention. Echocardiography on 03/29/2015 showed left ventricular ejection fraction of 25-30%. The etiology for the patient's congestive heart failure exacerbation is broad and unclear at this time. The most likely causes are increased salt intake or medication noncompliance.  Heart failure has been made aware of the patient and will evaluate him tomorrow. -- Echocardiogram -- Lasix 40mg  IV -- Hold Digoxin -- Cardiac monitoring -- Atorvastatin 40 mg -- Carvedilol 3.125 mg twice a day -- Spironolactone 12.5 mg twice a day -- Strict I/Os -- Daily Weights   2. Acute kidney injury, improving The most likely etiology for the patient's acute kidney injury secondary to decreased perfusion and acute heart failure exacerbation. His creatinine is improving. Most recent creatinine of 1.95 which is improved from 2.28 -- Lasix as above  DVT/PE prophylaxis: Heparin FEN/GI: Normal diet  Dispo: Anticipated discharge in approximately 1 day(s).   Ophelia Shoulder, MD 06/11/2016, 10:08 AM Pager: [phone]

## 2016-06-11 NOTE — Care Management Obs Status (Signed)
[facility] NOTIFICATION   Patient Details  Name: AIYDAN STRABALA MRN: [id number] Date of Birth: [birth year]   Medicare Observation Status Notification Given:  Yes    Dawayne Patricia, RN 06/11/2016, 3:19 PM

## 2016-06-12 ENCOUNTER — Observation Stay (HOSPITAL_BASED_OUTPATIENT_CLINIC_OR_DEPARTMENT_OTHER): Payer: Medicare Other

## 2016-06-12 DIAGNOSIS — R079 Chest pain, unspecified: Secondary | ICD-10-CM | POA: Diagnosis not present

## 2016-06-12 DIAGNOSIS — I5023 Acute on chronic systolic (congestive) heart failure: Secondary | ICD-10-CM | POA: Diagnosis not present

## 2016-06-12 DIAGNOSIS — N179 Acute kidney failure, unspecified: Secondary | ICD-10-CM | POA: Diagnosis not present

## 2016-06-12 DIAGNOSIS — I5021 Acute systolic (congestive) heart failure: Secondary | ICD-10-CM | POA: Diagnosis not present

## 2016-06-12 LAB — BASIC METABOLIC PANEL
ANION GAP: 7 (ref 5–15)
BUN: 31 mg/dL — AB (ref 6–20)
CO2: 29 mmol/L (ref 22–32)
Calcium: 8.7 mg/dL — ABNORMAL LOW (ref 8.9–10.3)
Chloride: 102 mmol/L (ref 101–111)
Creatinine, Ser: 1.47 mg/dL — ABNORMAL HIGH (ref 0.61–1.24)
GFR, EST AFRICAN AMERICAN: 56 mL/min — AB (ref >60)
GFR, EST NON AFRICAN AMERICAN: 49 mL/min — AB (ref >60)
Glucose, Bld: 115 mg/dL — ABNORMAL HIGH (ref 65–99)
POTASSIUM: 3.6 mmol/L (ref 3.5–5.1)
SODIUM: 138 mmol/L (ref 135–145)

## 2016-06-12 LAB — ECHOCARDIOGRAM COMPLETE: WEIGHTICAEL: 2614.4 [oz_av]

## 2016-06-12 LAB — GLUCOSE, CAPILLARY: GLUCOSE-CAPILLARY: 105 mg/dL — AB (ref 65–99)

## 2016-06-12 MED ORDER — FUROSEMIDE 40 MG PO TABS
40.0000 mg | ORAL_TABLET | Freq: Every day | ORAL | Status: DC
  Administered 2016-06-12: 40 mg via ORAL
  Filled 2016-06-12: qty 1

## 2016-06-12 MED ORDER — FUROSEMIDE 20 MG PO TABS: 40.0000 mg | ORAL_TABLET | Freq: Every day | ORAL | 0 refills | Status: DC

## 2016-06-12 NOTE — Progress Notes (Signed)
  Echocardiogram 2D Echocardiogram has been performed.  Diamond Nickel 06/12/2016, 12:37 PM

## 2016-06-12 NOTE — Progress Notes (Signed)
Advanced Heart Failure Rounding Note   Subjective:    Yesterday he continued to diurese with IV lasix. Weight down 2 pounds.  Negative 1.3 liters.   Denies SOB  Objective:   Weight Range:  Vital Signs:   Temp:  [98 F (36.7 C)-98.3 F (36.8 C)] 98.3 F (36.8 C) (10/19 0453) Pulse Rate:  [64-71] 68 (10/19 0453) Resp:  [18] 18 (10/19 0453) BP: (88-103)/(49-58) 88/58 (10/19 0453) SpO2:  [95 %-98 %] 95 % (10/19 0453) Weight:  [163 lb 6.4 oz (74.1 kg)] 163 lb 6.4 oz (74.1 kg) (10/19 0453) Last BM Date: 06/10/16  Weight change: Filed Weights   06/11/16 0513 06/12/16 0453  Weight: 165 lb 14.4 oz (75.3 kg) 163 lb 6.4 oz (74.1 kg)    Intake/Output:   Intake/Output Summary (Last 24 hours) at 06/12/16 0919 Last data filed at 06/11/16 1750  Gross per 24 hour  Intake              240 ml  Output             1600 ml  Net            -1360 ml    Physical Exam: General:  Well appearing. No resp difficulty. In the bed HEENT: normal Neck: supple. JVP 8-9. Carotids 2+ bilat; no bruits. No lymphadenopathy or thryomegaly appreciated. Cor: PMI nondisplaced. Regular rate & rhythm. No rubs, gallop. 3/6 HSM at apex. L upper chest ICD Lungs: clear Abdomen: soft, nontender, nondistended. No hepatosplenomegaly. No bruits or masses. Good bowel sounds. Extremities: no cyanosis, clubbing, rash, edema Neuro: alert & orientedx3, cranial nerves grossly intact. moves all 4 extremities w/o difficulty. Affect pleasant  Telemetry: NSR 70s  Labs: Basic Metabolic Panel:  Recent Labs Lab 06/10/16 1400 06/11/16 0246 06/12/16 0258  NA 139 138 138  K 3.8 3.4* 3.6  CL 104 102 102  CO2 24 27 29   GLUCOSE 92 108* 115*  BUN 46* 40* 31*  CREATININE 2.28* 1.95* 1.47*  CALCIUM 9.1 8.8* 8.7*  MG 2.0  --   --     Liver Function Tests:  Recent Labs Lab 06/10/16 1400  AST 37  ALT 40  ALKPHOS 102  BILITOT 1.4*  PROT 7.0  ALBUMIN 4.1   No results for input(s): LIPASE, AMYLASE in the last  168 hours. No results for input(s): AMMONIA in the last 168 hours.  CBC:  Recent Labs Lab 06/10/16 1400  WBC 7.2  NEUTROABS 5.2  HGB 14.0  HCT 41.1  MCV 95.1  PLT 137*    Cardiac Enzymes: No results for input(s): CKTOTAL, CKMB, CKMBINDEX, TROPONINI in the last 168 hours.  BNP: BNP (last 3 results)  Recent Labs  06/10/16 1402  BNP 835.4*    ProBNP (last 3 results) No results for input(s): PROBNP in the last 8760 hours.    Other results:  Imaging: Dg Chest 2 View  Result Date: 06/10/2016 CLINICAL DATA:  Bradycardia and cough EXAM: CHEST  2 VIEW COMPARISON:  03/06/2016 FINDINGS: Cardiac shadow remains enlarged. A defibrillator is again seen. The lungs are well aerated bilaterally with mild interstitial changes. No acute infiltrate is noted. Postsurgical changes are again seen. Small bilateral pleural effusions are seen likely of a chronic nature. No focal infiltrate is noted. IMPRESSION: Chronic small pleural effusions posteriorly. No acute infiltrate is noted. Electronically Signed   By: Inez Catalina M.D.   On: 06/10/2016 14:55      Medications:     Scheduled Medications: .  aspirin EC  81 mg Oral BID  . atorvastatin  40 mg Oral q1800  . carvedilol  3.125 mg Oral BID WC  . citalopram  20 mg Oral Daily  . ferrous sulfate  325 mg Oral Q breakfast  . heparin  5,000 Units Subcutaneous Q8H  . pantoprazole  40 mg Oral Daily  . sodium chloride flush  3 mL Intravenous Q12H     Infusions:     PRN Medications:  acetaminophen **OR** acetaminophen, tiZANidine, traMADol   Assessment/Discussion/Plab   Mr Olmsted is a 65 year old with mild retardation, ICM (CABGx5 2015) and chronic systolic heart failure admitted with volume overload and bradycardia-->PVCs. He is well known to HF team and has been followed by Dr [name]. EP consulted HF team for volume overload and AKI noted on admit.   1. A/C Systolic Heart Failure: Ischemic cardiomyopathy. Has Medtronic ICD.  Narrow QRS.  On exam he is warm and wet so will diurese for now.  May need PICC to check CO-OX but dont think low output.  Volume status improving. IV lasix stopped this morning. Start lasix 40 mg po daily.   Continue low dose carvedilol 3.125 mg twice a day.  Stop spiro with elevated creatinine. Keep off ace. ECHO pending and ordered yesterday to look at [facility] and MV. Most recent 2016 EF 25-30%. If EF down will need to consider RHC/LHC. Has not had CP but he is a poor historian. Troponin on admit 0.04.  2.PVCs- EP following. On BB 3. AKI- baseline creatinine 1-1.3. Creatinine on admit 2.28. Todays Creatinine trending down 1.9>1.4 Renal Ultrasound ordered however Dr Lovena Le cancelled. Will re-order 4. CAD- S/P CABGx5.- on statin, aspirin, and bb 5. Back pain, chronic  Length of Stay: 0  Amy Clegg NP-C  06/12/2016, 9:19 AM  Advanced Heart Failure Team Pager [phone] (M-F; 7a - 4p)  Please contact [facility] for night-coverage after hours (4p -7a ) and weekends on [email or web address]  Patient seen with NP, agree with the above note.  1. Acute on chronic systolic CHF: Ischemic cardiomyopathy.  Medtronic ICD.  Prior echo with EF 25-30%.   Also presented with AKI.   He presented with volume overload, was diuresed and now looks considerably better.  - Hold ACEI, spironolactone, and digoxin.  Creatinine is better but BP is soft, so will hold off on restarting ACEI and spironolactone for now.  - Transition to Lasix 40 mg daily.  - Continue Coreg 3.125 mg bid.  - Repeat echo pending.  2. CAD: s/p CABG.  No chest pain.  Continue ASA 81 and statin.  Slight increase in troponin is likely due to demand ischemia.  3. AKI: ?Etiology.  He was initially volume overloaded, improved with diuresis.  Renal ultrasound ordered.  Creatinine now down to 1.4.  4. Murmur: Patient has MR murmur on exam. Awaiting echo.  5. PVCs: Continue beta blocker.   [name] 06/12/2016 1:30 PM

## 2016-06-12 NOTE — Progress Notes (Signed)
   Subjective: No acute events overnight. Patient is feeling well and states he is ready to go home. He is taking good by mouth intake and is able to ambulate without difficulty. He had no additional questions this morning.  Objective:  Vital signs in last 24 hours: Vitals:   06/11/16 0513 06/11/16 1544 06/11/16 1941 06/12/16 0453  BP: 95/63 (!) 91/58 (!) 103/49 (!) 88/58  Pulse: 73 64 71 68  Resp: 17 18 18 18   Temp: 98.5 F (36.9 C) 98.3 F (36.8 C) 98 F (36.7 C) 98.3 F (36.8 C)  TempSrc: Oral Oral Oral Oral  SpO2: 95% 98% 98% 95%  Weight: 165 lb 14.4 oz (75.3 kg)   163 lb 6.4 oz (74.1 kg)   Physical Exam  Constitutional: He is oriented to person, place, and time. He appears well-developed and well-nourished.  HENT:  Head: Normocephalic and atraumatic.  Cardiovascular: Normal rate and regular rhythm.   Murmur heard. Grade 2/6 systolic murmur heard best at the apex  Respiratory: Effort normal and breath sounds normal.  GI: Soft. Bowel sounds are normal. He exhibits no distension. There is no tenderness.  Musculoskeletal: He exhibits no edema.  Neurological: He is alert and oriented to person, place, and time.     Assessment/Plan: Mr. Resendiz is a 65 year old gentleman with a complicated past cardiac history who presents with a one-week history of fatigue with an elevated BNP and jugular venous distention on physical examination consistent with congestive heart failure exacerbation.   1. CHF exacerbation, improving Patient appears closer to euvolemic on examination today. No crackles were appreciated on auscultation. No jugular venous distention was present. No lower extremity edema was present. He was slightly hypotensive this morning and we held his IV Lasix dose. At this time we'll transition him to oral formulations and will start 40 mg Lasix once daily. -- Echocardiogram today, per cardiology if the patient's ejection fraction has decreased they will consider RHC/LHC. We  will follow with cardiology for their recommendations. -- Lasix 40 mg by mouth daily -- Hold Digoxin -- Cardiac monitoring -- Atorvastatin 40 mg -- Carvedilol 3.125 mg twice a day  2. Acute kidney injury, improving Patient's most recent creatinine of 1.47. His creatinine at time of presentation was 2.28. Previous creatinines over the last year range from 0.97-1.30. The most likely etiology for the patient's acute kidney injury is prerenal secondary to congestive heart failure exacerbation with decreased renal perfusion. However, I spoke with cardiology and there is concern for possible renal artery stenosis and are interested in getting a renal artery ultrasound.  -- Lasix as above   Dispo: Anticipated discharge this afternoon or tomorrow.   Ophelia Shoulder, MD 06/12/2016, 10:33 AM Pager: [phone]

## 2016-06-12 NOTE — Progress Notes (Signed)
Pt has discharge orders in place. Pt stated that he wants his mother to be present when discharge teaching is done. Pt's mother has been contacted, but has not answered.   Grant Fontana BSN, RN

## 2016-06-12 NOTE — Progress Notes (Signed)
Pt has been discharged home with family. Telemetry box and IV were removed. Pt and pt's brother received discharge instructions and all questions were answered. Pt left the unit via wheelchair and was accompanied by a nurse tech and pt's family. Pt was in no distress at time of discharge.   Grant Fontana BSN, RN

## 2016-06-12 NOTE — Consult Note (Signed)
   [facility] CM Inpatient Consult   06/12/2016  MCDONALD SHANKEL [birth year] [id number]   Spoke with Mr. Hout mother via phone at [phone] to discuss [place] Management program per patient's request. Mrs. Naeve is very hard of hearing. Nonetheless, she was able to able to hear enough to say that she rather discuss the program with her other son, Riyaan Waldner. Writer informed her that information will be left again at bedside for them to overlook and to call if interested in [place] Management program. Ms. Banducci did not want to consent to [place] [facility] at this time. States she usually has people contact her other son, Iverson Cannoy. However, she states he is currently at [place] with his wife and she would rather Probation officer not contact him at this time. Will leave literature and contact information at bedside about [place] Management program. Will let inpatient RNCM aware.    Marthenia Rolling, MSN-Ed, RN,BSN [facility] Liaison [phone]

## 2016-06-13 ENCOUNTER — Telehealth: Payer: Self-pay | Admitting: *Deleted

## 2016-06-13 NOTE — Discharge Summary (Signed)
Name: Carl [name] MRN: [id number] DOB: 1951/05/02 65 y.o. PCP: [name], MD  Date of Admission: 06/10/2016  1:34 PM Date of Discharge: 06/13/2016 Attending Physician: No att. providers found  Discharge Diagnosis: 1. Congestive heart failure exacerbation 2. Acute kidney injury Principal Problem:   AKI (acute kidney injury) ([place]) Active Problems:   Bradycardia   Premature ventricular contraction   Systolic dysfunction with acute on chronic heart failure (HCC)   Non-rheumatic mitral regurgitation   Discharge Medications:   Medication List    STOP taking these medications   amoxicillin-clavulanate 875-125 MG tablet Commonly known as:  AUGMENTIN   digoxin 0.125 MG tablet Commonly known as:  LANOXIN   lisinopril 2.5 MG tablet Commonly known as:  PRINIVIL,ZESTRIL   spironolactone 25 MG tablet Commonly known as:  ALDACTONE     TAKE these medications   aspirin 81 MG tablet Take 81 mg by mouth 2 (two) times daily.   atorvastatin 40 MG tablet Commonly known as:  LIPITOR Take 1 tablet (40 mg total) by mouth daily at 6 PM.   carvedilol 3.125 MG tablet Commonly known as:  COREG Take 3.125 mg by mouth 2 (two) times daily with a meal.   citalopram 20 MG tablet Commonly known as:  CELEXA Take 1 tablet (20 mg total) by mouth daily.   ferrous sulfate 325 (65 FE) MG tablet Take 325 mg by mouth daily with breakfast.   furosemide 20 MG tablet Commonly known as:  LASIX Take 2 tablets (40 mg total) by mouth daily.   omeprazole 20 MG capsule Commonly known as:  PRILOSEC Take 20 mg by mouth 2 (two) times daily.   ondansetron 4 MG disintegrating tablet Commonly known as:  ZOFRAN ODT Take 1 tablet (4 mg total) by mouth every 8 (eight) hours as needed for nausea or vomiting.   tiZANidine 4 MG tablet Commonly known as:  ZANAFLEX Take 4 mg by mouth every 6 (six) hours as needed for muscle spasms.   traMADol 50 MG tablet Commonly known as:  ULTRAM Take 50 mg  by mouth every 6 (six) hours as needed.       Disposition and follow-up:   Mr.Carl [name] was discharged from [facility] in Good condition.  At the hospital follow up visit please address:  1.  Follow-up with cardiology as needed.  2.  Labs / imaging needed at time of follow-up: Repeat basic metabolic panel and assess creatinine  3.  Pending labs/ test needing follow-up: None  Follow-up Appointments: Follow-up Information    Darrick Grinder, NP Follow up on 06/19/2016.   Specialty:  Cardiology Why:  at 1:40 Garage Code 4000 Heart Failure [facility] at [facility] information: [address] [address] [place] Hospital Course by problem list:  1. Congestive heart failure exacerbation The patient presented to the [facility] emergency department on 06/10/2016 from his PCPs office for lightheadedness, fatigue and cough. At time of presentation it was noted that the patient was volume overloaded with bilateral lower extremity edema and jugular venous distention to the angle of the mandible bilaterally. Additionally, he endorsed a cough which is chronic in nature but was not worsened from baseline. He denied sputum production. In the emergency department the patient was afebrile and hemodynamically stable. CBC was significant for thrombocytopenia 137. CMP showed an elevated creatinine of 2.28, elevated BUN of 46. Additionally, patient had an elevated BNP of  835.4. Urinalysis within normal limits. Chest x-ray showed chronic small pleural effusions posteriorly without acute infiltrate. The patient was admitted to the [facility] internal medicine teaching service for further workup and management of congestive heart failure exacerbation.  Once admitted the patient was started on IV Lasix 40 mg and diuresed appropriately. His fatigue and lightheadedness improved. He had a repeat echocardiogram which showed left ventricular ejection fraction of  20-25%. He was evaluated by electrophysiology and a pacemaker was evaluated which was normal. He was seen by heart failure who recommended diuresis and starting the patient on Lasix 40 mg daily by mouth.   2. Acute kidney injury At the time of presentation the patient's creatinine was 2.28. His baseline creatinine ranges from 0.97-1.30. The most likely etiology for the patient's acute kidney injury was secondary to cardiorenal syndrome in the setting of acute systolic congestive heart failure exacerbation. The patient was diuresed as described above with improvements in his creatinine. At the day of discharge the patient's creatinine was 1.47. He will benefit from follow-up labs in the outpatient setting to see if this acute kidney injury continues to resolve.  Discharge Vitals:   BP 107/72 (BP Location: Right Arm)   Pulse 72   Temp 97.5 F (36.4 C) (Oral)   Resp 18   Wt 163 lb 6.4 oz (74.1 kg)   SpO2 98%   BMI 22.16 kg/m   Pertinent Labs, Studies, and Procedures:  1. Chest x-ray-chronic small pleural effusions posteriorly, no acute cardiopulmonary abnormality 2. Echocardiogram-left ventricular ejection fraction 20-25%  Discharge Instructions: Discharge Instructions    Diet - low sodium heart healthy    Complete by:  As directed    Increase activity slowly    Complete by:  As directed       Signed: Ophelia Shoulder, MD 06/13/2016, 11:28 AM   Pager: [phone]

## 2016-06-13 NOTE — Telephone Encounter (Signed)
Called patient, no answer, encouraged patient to call back if any questions or concerns should arise.

## 2016-06-18 ENCOUNTER — Encounter: Payer: Self-pay | Admitting: Nurse Practitioner

## 2016-06-18 ENCOUNTER — Telehealth: Payer: Self-pay | Admitting: Cardiology

## 2016-06-18 NOTE — Telephone Encounter (Signed)
Spoke with pt and reminded pt of remote transmission that is due today. Pt verbalized understanding.   

## 2016-06-19 ENCOUNTER — Ambulatory Visit (HOSPITAL_COMMUNITY)
Admission: RE | Admit: 2016-06-19 | Discharge: 2016-06-19 | Disposition: A | Discharge status: Home / Self Care | Payer: Medicare Other | Source: Ambulatory Visit | Attending: Cardiology | Admitting: Cardiology

## 2016-06-19 ENCOUNTER — Encounter (HOSPITAL_COMMUNITY): Payer: Self-pay

## 2016-06-19 VITALS — BP 98/64 | HR 74 | Wt 166.0 lb

## 2016-06-19 DIAGNOSIS — Z95810 Presence of automatic (implantable) cardiac defibrillator: Secondary | ICD-10-CM

## 2016-06-19 DIAGNOSIS — N182 Chronic kidney disease, stage 2 (mild): Secondary | ICD-10-CM | POA: Diagnosis not present

## 2016-06-19 DIAGNOSIS — E785 Hyperlipidemia, unspecified: Secondary | ICD-10-CM | POA: Insufficient documentation

## 2016-06-19 DIAGNOSIS — I519 Heart disease, unspecified: Secondary | ICD-10-CM | POA: Diagnosis not present

## 2016-06-19 DIAGNOSIS — I255 Ischemic cardiomyopathy: Secondary | ICD-10-CM | POA: Insufficient documentation

## 2016-06-19 DIAGNOSIS — I5022 Chronic systolic (congestive) heart failure: Secondary | ICD-10-CM | POA: Diagnosis not present

## 2016-06-19 DIAGNOSIS — F70 Mild intellectual disabilities: Secondary | ICD-10-CM | POA: Diagnosis not present

## 2016-06-19 DIAGNOSIS — Z8249 Family history of ischemic heart disease and other diseases of the circulatory system: Secondary | ICD-10-CM | POA: Diagnosis not present

## 2016-06-19 DIAGNOSIS — F329 Major depressive disorder, single episode, unspecified: Secondary | ICD-10-CM | POA: Insufficient documentation

## 2016-06-19 DIAGNOSIS — E7849 Other hyperlipidemia: Secondary | ICD-10-CM

## 2016-06-19 DIAGNOSIS — E784 Other hyperlipidemia: Secondary | ICD-10-CM | POA: Diagnosis not present

## 2016-06-19 DIAGNOSIS — I2510 Atherosclerotic heart disease of native coronary artery without angina pectoris: Secondary | ICD-10-CM | POA: Insufficient documentation

## 2016-06-19 DIAGNOSIS — Z951 Presence of aortocoronary bypass graft: Secondary | ICD-10-CM | POA: Diagnosis not present

## 2016-06-19 DIAGNOSIS — Z7982 Long term (current) use of aspirin: Secondary | ICD-10-CM | POA: Insufficient documentation

## 2016-06-19 DIAGNOSIS — Z87891 Personal history of nicotine dependence: Secondary | ICD-10-CM | POA: Insufficient documentation

## 2016-06-19 LAB — BASIC METABOLIC PANEL
Anion gap: 10 (ref 5–15)
BUN: 21 mg/dL — ABNORMAL HIGH (ref 6–20)
CALCIUM: 9.9 mg/dL (ref 8.9–10.3)
CHLORIDE: 98 mmol/L — AB (ref 101–111)
CO2: 30 mmol/L (ref 22–32)
CREATININE: 1.37 mg/dL — AB (ref 0.61–1.24)
GFR, EST NON AFRICAN AMERICAN: 53 mL/min — AB (ref >60)
Glucose, Bld: 115 mg/dL — ABNORMAL HIGH (ref 65–99)
Potassium: 4.1 mmol/L (ref 3.5–5.1)
SODIUM: 138 mmol/L (ref 135–145)

## 2016-06-19 NOTE — Patient Instructions (Signed)
Routine lab work today. Will notify you of abnormal results, otherwise no news is good news!  Follow up 4 weeks with Dr. McLean.  Do the following things EVERYDAY: 1) Weigh yourself in the morning before breakfast. Write it down and keep it in a log. 2) Take your medicines as prescribed 3) Eat low salt foods-Limit salt (sodium) to 2000 mg per day.  4) Stay as active as you can everyday 5) Limit all fluids for the day to less than 2 liters 

## 2016-06-19 NOTE — Progress Notes (Signed)
Patient ID: Carl [name], male   DOB: 04/24/1951, 65 y.o.   MRN: [id number] PCP: Dr. Ginette Pitman  65 yo with history of smoking and mild mental retardation Patient was admitted to [facility] in 12/15 with dyspnea.  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  LHC showed 3 vessel disease with EF 15%.  Echo showed EF 15-20%.  Patient had CABG x 5.  It was difficult to wean him off pressors post-op.  He ended up having to start midodrine.  This was weaned off.  Admitted October with volume overload and AKI.Diuresed with IV lasix and transitiioned to 40 mg lasix daily. Spiro, dig, lisinopril stopped due to elevated creatinine 2.28. Discharge weight 163 pounds.    He returns for post hospital follow up. Overall feeling ok. Denies SOB/Orthopnea/PND. Has ongoing back pain. Not weighing at home. Taking all medications. His Mom prepares all medications. Tries to follow low salt diet.   ECHO 06/12/2016  - Left ventricle: The cavity size was moderately dilated. Wall   thickness was normal. Systolic function was severely reduced. The   estimated ejection fraction was in the range of 20% to 25%.   Diffuse hypokinesis. - Mitral valve: There was moderate regurgitation. - Pulmonary arteries: Systolic pressure was moderately increased.   PA peak pressure: 46 mm Hg (S).  ECHO 11/07/2014  Left ventricle: Diffuse hypokinesis with inferior akinesis The   cavity size was severely dilated. Wall thickness was normal.   Systolic function was severely reduced. The estimated ejection   fraction was in the range of 25% to 30%. Doppler parameters are   consistent with elevated ventricular end-diastolic filling   pressure. - Aortic valve: There was trivial regurgitation. - Mitral valve: There was mild to moderate regurgitation. - Left atrium: The atrium was moderately dilated. - Atrial septum: A septal defect cannot be excluded. - Pulmonary arteries: PA peak pressure: 41 mm Hg (S).   Labs (12/15): K 4.1, creatinine 0.81, hgb 9.1 Labs  (09/12/2014): K 3.7 Creatinine 0.96, digoxin 0.7 Labs (3/16): digoxin 0.8, LDL 62, HDL 40 Labs (4/16): K 4.3, creatinine 1.1 Labs (5/16) K 4.6, creatinine 1.24, digoxin 1.0 Labs (06/12/2016): K 3.6 Creatinine 1.47   PMH: 1. Smoker 2. Mild mental retardation 3. CAD: LHC (12/15) with 3 vessel disease.  CABG x 5 in 12/15 with LIMA-LAD, SVG-D1, sequential SVG-OM2/OM3, SVG-PDA.   4. Ischemic cardiomyopathy: Echo (12/15) with EF 15-20%, wall motion abnormalities, mildly decreased RV systolic function, mild MR.  Echo (3/16) with EF 30-35%, severe LV dilation, moderate MR, PA systolic pressure 42 mmHg. Echo (8/16) with EF 25-30%, severely dilated LV, diffuse hypokinesis with inferior akinesis, restrictive diastolic function, RV mildly dilated with mildly decreased systolic function, moderate MR.  5. Depression  SH: Lives with mother, quit smoking in 12/15.  No ETOH.   FH: Brother and father with MIs.    ROS: All systems reviewed and negative except as per HPI.   Current Outpatient Prescriptions  Medication Sig Dispense Refill  . aspirin 81 MG tablet Take 81 mg by mouth 2 (two) times daily.    Marland Kitchen atorvastatin (LIPITOR) 40 MG tablet Take 1 tablet (40 mg total) by mouth daily at 6 PM. 30 tablet 1  . carvedilol (COREG) 3.125 MG tablet Take 3.125 mg by mouth 2 (two) times daily with a meal.    . citalopram (CELEXA) 20 MG tablet Take 1 tablet (20 mg total) by mouth daily. (Patient not taking: Reported on 06/10/2016) 30 tablet 6  . ferrous sulfate 325 (  65 FE) MG tablet Take 325 mg by mouth daily with breakfast.    . furosemide (LASIX) 20 MG tablet Take 2 tablets (40 mg total) by mouth daily. 30 tablet 0  . omeprazole (PRILOSEC) 20 MG capsule Take 20 mg by mouth 2 (two) times daily.    . ondansetron (ZOFRAN ODT) 4 MG disintegrating tablet Take 1 tablet (4 mg total) by mouth every 8 (eight) hours as needed for nausea or vomiting. (Patient not taking: Reported on 06/10/2016) 20 tablet 0  . tiZANidine  (ZANAFLEX) 4 MG tablet Take 4 mg by mouth every 6 (six) hours as needed for muscle spasms.    . traMADol (ULTRAM) 50 MG tablet Take 50 mg by mouth every 6 (six) hours as needed.     No current facility-administered medications for this encounter.    BP 98/64 (BP Location: Left Arm, Patient Position: Sitting, Cuff Size: Normal)   Pulse 74 Comment: irregular  Wt 166 lb (75.3 kg)   SpO2 99%   BMI 22.51 kg/m   Filed Weights   06/19/16 1355  Weight: 166 lb (75.3 kg)     General: NAD. Ambulated in the clinic without difficutly Neck: JVP 5-6 cm, no thyromegaly or thyroid nodule.  Lungs: Clear to auscultation bilaterally with normal respiratory effort. CV: Nondisplaced PMI.  Heart regular S1/S2, no S3/S4, 3/6 HSM at apex.  Trace peripheral edema LEs.  No carotid bruit.  Normal pedal pulses.  Abdomen: Soft, nontender, no hepatosplenomegaly, no distention.  Skin: Intact without lesions.  Neurologic: Alert and oriented x 3.  Psych: Flat affect. Extremities: No clubbing or cyanosis.  HEENT: Normal.    Assessment/Plan: 1. CAD: Stable s/p CABG. Doing well.  Atypical chest pain, doubt that it is ischemic.  Continue ASA, statin.  He completed cardiac rehab at [facility].  2. Chronic systolic CHF: Ischemic CMP. ECHO 06/12/2016 EF 20-25%.  Has Medtronic ICD. NYHA class II symptoms, overall doing well.   Volume status stable.   - Continue Coreg 3.125 mg bid.  Off spiro/ace/dig for now with recent elevated creatinine.   Continue lasix 40 mg daily   Anticipate restarting  Low dose spiro next visit.  SBP soft today.  3. Smoking: He has quit smoking since CABG. 4. Depression: On Celexa.  5. Hyperlipidemia: Good lipids 3/16, continue statin.     6. MR: Moderate on echo, significant murmur on exam.  Follow over time.   Follow up 4 weeks. BMET today.   Joette Schmoker NP-C  06/19/2016

## 2016-06-26 NOTE — Progress Notes (Signed)
No ICM remote transmission received for scheduled date 06/18/2016 and next one scheduled for 07/14/2016

## 2016-07-11 ENCOUNTER — Other Ambulatory Visit: Payer: Self-pay | Admitting: Internal Medicine

## 2016-07-11 DIAGNOSIS — M5442 Lumbago with sciatica, left side: Principal | ICD-10-CM

## 2016-07-11 DIAGNOSIS — M5441 Lumbago with sciatica, right side: Principal | ICD-10-CM

## 2016-07-11 DIAGNOSIS — G8929 Other chronic pain: Secondary | ICD-10-CM

## 2016-07-14 ENCOUNTER — Ambulatory Visit (INDEPENDENT_AMBULATORY_CARE_PROVIDER_SITE_OTHER): Payer: Medicare Other

## 2016-07-14 ENCOUNTER — Telehealth: Payer: Self-pay

## 2016-07-14 DIAGNOSIS — I5022 Chronic systolic (congestive) heart failure: Secondary | ICD-10-CM | POA: Diagnosis not present

## 2016-07-14 DIAGNOSIS — Z95810 Presence of automatic (implantable) cardiac defibrillator: Secondary | ICD-10-CM

## 2016-07-14 NOTE — Telephone Encounter (Signed)
Remote ICM transmission received.  Attempted patient call and and person answering phone stated he was not home.

## 2016-07-14 NOTE — Progress Notes (Signed)
EPIC Encounter for ICM Monitoring  Patient Name: Carl [name] is a 65 y.o. male Date: 07/14/2016 Primary Care Physican: [name], MD Primary Cardiologist: [name] Electrophysiologist: Allred Dry Weight:  unknown        Attempted ICM call and unable to reach and spoke with mother.  Advised to have patient call back. Transmission reviewed.    Thoracic impedance abnormal suggesting fluid accumulation.  Med list shows 30 tablets of Furosemide 20 mg 2 tablets (40 mg total) daily ordered at time of hospital discharge on 06/12/2016 but no refills.  If patient reached will inquire if he has been taking any Furosemide in the last 2 weeks.    Labs: 06/19/2016 Creatinine 1.37, BUN 21, Potassium 4.1, Sodium 138, EGFR 53->60 06/12/2016 Creatinine 1.47, BUN 31, Potassium 3.6, Sodium 138, EGFR 49-56  06/11/2016 Creatinine 1.95, BUN 40, Potassium 3.4, Sodium 138, EGFR 35-40  06/10/2016 Creatinine 2.28, BUN 46, Potassium 3.8, Sodium 139, EGFR 29-33  03/06/2016 Creatinine 0.97, BUN 16, Potassium 3.6, Sodium 139, EGFR >60->60   Recommendations: NONE- Unable to reach  Follow-up plan: ICM clinic phone appointment on 08/21/2016 since he has HF office appointment on 07/22/2016 and [name] 08/04/2016.   Copy of ICM check sent to primary cardiologist and device physician.   ICM trend: 07/14/2016       Rosalene Billings, RN 07/14/2016 3:01 PM

## 2016-07-15 ENCOUNTER — Other Ambulatory Visit: Payer: Self-pay | Admitting: Internal Medicine

## 2016-07-15 DIAGNOSIS — G8929 Other chronic pain: Secondary | ICD-10-CM

## 2016-07-15 DIAGNOSIS — M545 Low back pain: Principal | ICD-10-CM

## 2016-07-15 MED ORDER — FUROSEMIDE 20 MG PO TABS: ORAL_TABLET | ORAL | 3 refills | Status: DC

## 2016-07-15 NOTE — Progress Notes (Signed)
If he is not taking Lasix, needs to start on 40 mg daily.  If he has been taking Lasix 40 mg daily, increase to bid and make sure he is taking at least 20 mEq KCl.  Will need BMET next Monday and followup in CHF clinic next week with PA/NP.

## 2016-07-15 NOTE — Progress Notes (Signed)
Reviewed with [name], NP.  Call to patient's mother and provided recommendation for prescription of Furosemide 20 mg 2 tablets (40 mg total) by mouth daily.  Patient has HF clinic appointment on 07/22/2016.  [facility]. Office appointment with [name] on 08/04/2016.  Next ICM remote transmission 08/19/2016

## 2016-07-15 NOTE — Progress Notes (Signed)
Spoke with patient and mother.  Patient gives permission to speak with Mother regarding health and may leave message on home and cell numbers.    Patient is symptomatic with increase in SOB, swelling in legs and feet and feeling very tired.   No weight gain, weight is 157 lbs.    Mother reported patient is not taking any Furosemide and does not know when it was stopped but thinks it was shortly after he was discharged from hospital in October.     Advised would send copy of note to Dr [name] and Darrick Grinder NP for review and recommendations regarding symptoms and decreased thoracic impedance.   Patient has HF office visit on 07/22/2016

## 2016-07-15 NOTE — Progress Notes (Signed)
Attempted call to patient and no answer or answering machine

## 2016-07-15 NOTE — Progress Notes (Signed)
Attempted call to patient and mother answered phone stating patient was not home.

## 2016-07-21 NOTE — Progress Notes (Addendum)
Followed recommendation of [name], NP which is same recommendation as Dr [name] which was to start Lasix 40 mg started daily.  Patient has CHF clinic appointment tomorrow, 07/22/2016 and message sent to HF clinic if BMET can be drawn at that appointment.

## 2016-07-21 NOTE — Progress Notes (Signed)
bmet added to patients office visit 11/28

## 2016-07-22 ENCOUNTER — Ambulatory Visit (HOSPITAL_COMMUNITY)
Admission: RE | Admit: 2016-07-22 | Discharge: 2016-07-22 | Disposition: A | Discharge status: Home / Self Care | Payer: Medicare Other | Source: Ambulatory Visit | Attending: Cardiology | Admitting: Cardiology

## 2016-07-22 ENCOUNTER — Encounter (HOSPITAL_COMMUNITY): Payer: Self-pay

## 2016-07-22 VITALS — BP 102/61 | HR 66 | Wt 164.8 lb

## 2016-07-22 DIAGNOSIS — Z87891 Personal history of nicotine dependence: Secondary | ICD-10-CM | POA: Diagnosis not present

## 2016-07-22 DIAGNOSIS — I5022 Chronic systolic (congestive) heart failure: Secondary | ICD-10-CM | POA: Diagnosis not present

## 2016-07-22 DIAGNOSIS — F329 Major depressive disorder, single episode, unspecified: Secondary | ICD-10-CM | POA: Insufficient documentation

## 2016-07-22 DIAGNOSIS — Z95810 Presence of automatic (implantable) cardiac defibrillator: Secondary | ICD-10-CM | POA: Diagnosis not present

## 2016-07-22 DIAGNOSIS — F70 Mild intellectual disabilities: Secondary | ICD-10-CM | POA: Insufficient documentation

## 2016-07-22 DIAGNOSIS — I255 Ischemic cardiomyopathy: Secondary | ICD-10-CM | POA: Diagnosis not present

## 2016-07-22 DIAGNOSIS — E785 Hyperlipidemia, unspecified: Secondary | ICD-10-CM | POA: Insufficient documentation

## 2016-07-22 DIAGNOSIS — Z9889 Other specified postprocedural states: Secondary | ICD-10-CM | POA: Diagnosis not present

## 2016-07-22 DIAGNOSIS — N182 Chronic kidney disease, stage 2 (mild): Secondary | ICD-10-CM

## 2016-07-22 DIAGNOSIS — Z951 Presence of aortocoronary bypass graft: Secondary | ICD-10-CM | POA: Diagnosis not present

## 2016-07-22 DIAGNOSIS — Z79899 Other long term (current) drug therapy: Secondary | ICD-10-CM | POA: Diagnosis not present

## 2016-07-22 DIAGNOSIS — I2510 Atherosclerotic heart disease of native coronary artery without angina pectoris: Secondary | ICD-10-CM | POA: Diagnosis not present

## 2016-07-22 DIAGNOSIS — Z7982 Long term (current) use of aspirin: Secondary | ICD-10-CM | POA: Insufficient documentation

## 2016-07-22 DIAGNOSIS — I5023 Acute on chronic systolic (congestive) heart failure: Secondary | ICD-10-CM | POA: Diagnosis not present

## 2016-07-22 MED ORDER — FUROSEMIDE 20 MG PO TABS: ORAL_TABLET | ORAL | 3 refills | Status: DC

## 2016-07-22 MED ORDER — SPIRONOLACTONE 25 MG PO TABS: 25.0000 mg | ORAL_TABLET | Freq: Every day | ORAL | 3 refills | Status: DC

## 2016-07-22 MED ORDER — LOSARTAN POTASSIUM 25 MG PO TABS: 25.0000 mg | ORAL_TABLET | Freq: Every day | ORAL | 3 refills | Status: DC

## 2016-07-22 NOTE — Patient Instructions (Addendum)
Increase Furosamide to 40 mg (2 Tabs) Two Times daily for 5 Days only. Then on Monday December 4th start taking 40 mg (2 Tabs) in the AM and 20 mg (1 Tab) in the evening.  STOP Taking Lisinopril.  Start taking Losartan 25 mg (1 Tab) Once Daily in the Evening  Start taking Spironolactone in the Evening  Labs in 2 Weeks  Follow up in 1 Month

## 2016-07-24 NOTE — Progress Notes (Signed)
Patient ID: Carl [name], male   DOB: 11-Mar-1951, 65 y.o.   MRN: [id number] PCP: Dr. Ginette Pitman  65 yo with history of smoking and mild mental retardation was admitted to [facility] in 12/15 with dyspnea.  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  LHC showed 3 vessel disease with EF 15%.  Echo showed EF 15-20%.  Patient had CABG x 5.  It was difficult to wean him off pressors post-op.  He ended up having to start midodrine.  This was weaned off.  Admitted 10/17 with volume overload and AKI.Diuresed with IV lasix and transitiioned to 40 mg lasix daily. Spiro, dig, lisinopril stopped due to elevated creatinine 2.28. Discharge weight 163 pounds.    He returns for followup.  He has had a chronic dry cough.  Rare dyspnea, only with moderate to heavy exertion.  BP is relatively soft but he denies lightheadedness.  No chest pain. Main complaint is low back pain.   Optivol: Fluid index > threshold, impedance has decreased. ................................................................................................................................................................................................................................................................................................................Marland Kitchen Labs (12/15): K 4.1, creatinine 0.81, hgb 9.1 Labs (09/12/2014): K 3.7 Creatinine 0.96, digoxin 0.7 Labs (3/16): digoxin 0.8, LDL 62, HDL 40 Labs (4/16): K 4.3, creatinine 1.1 Labs (5/16) K 4.6, creatinine 1.24, digoxin 1.0 Labs (05/2016): K 3.6 Creatinine 1.47  => 1.37 Labs (11/17): LDL 61, HDL 41, K 4.2, creaitnine 1.1  PMH: 1. Smoker 2. Mild mental retardation 3. CAD: LHC (12/15) with 3 vessel disease.  CABG x 5 in 12/15 with LIMA-LAD, SVG-D1, sequential SVG-OM2/OM3, SVG-PDA.   4. Ischemic cardiomyopathy: Echo (12/15) with EF 15-20%, wall motion abnormalities, mildly decreased RV systolic function, mild MR.  Echo (3/16) with EF 30-35%, severe LV dilation, moderate MR, PA systolic pressure 42 mmHg.  Echo (8/16) with EF 25-30%, severely dilated LV, diffuse hypokinesis with inferior akinesis, restrictive diastolic function, RV mildly dilated with mildly decreased systolic function, moderate MR.  - Echo (10/17): EF 20-25%, moderate MR.  - Medtronic ICD.  - ACEI cough.  5. Depression 6. Vitiligo  SH: Lives with mother, quit smoking in 12/15.  No ETOH.   FH: Brother and father with MIs.    ROS: All systems reviewed and negative except as per HPI.   Current Outpatient Prescriptions  Medication Sig Dispense Refill  . aspirin 81 MG tablet Take 81 mg by mouth 2 (two) times daily.    Marland Kitchen atorvastatin (LIPITOR) 40 MG tablet Take 1 tablet (40 mg total) by mouth daily at 6 PM. 30 tablet 1  . carvedilol (COREG) 3.125 MG tablet Take 3.125 mg by mouth 2 (two) times daily with a meal.    . cyanocobalamin 500 MCG tablet Take 500 mcg by mouth daily.    . ferrous sulfate 325 (65 FE) MG tablet Take 325 mg by mouth daily with breakfast.    . furosemide (LASIX) 20 MG tablet Take 2 tablets (40 mg total) by mouth in the AM, take 1 Tab (20 mg) by mouth in the PM. 90 tablet 3  . omeprazole (PRILOSEC) 20 MG capsule Take 20 mg by mouth 2 (two) times daily.    Marland Kitchen spironolactone (ALDACTONE) 25 MG tablet Take 1 tablet (25 mg total) by mouth daily. Take in Evening 30 tablet 3  . tiZANidine (ZANAFLEX) 4 MG tablet Take 4 mg by mouth every 6 (six) hours as needed for muscle spasms.    . traMADol (ULTRAM) 50 MG tablet Take 50 mg by mouth every 6 (six) hours as needed.    Marland Kitchen losartan (COZAAR) 25 MG tablet  Take 1 tablet (25 mg total) by mouth daily. In Evening 30 tablet 3   No current facility-administered medications for this encounter.    BP 102/61   Pulse 66   Wt 164 lb 12.8 oz (74.8 kg)   SpO2 99%   BMI 22.35 kg/m   Filed Weights   07/22/16 1542  Weight: 164 lb 12.8 oz (74.8 kg)     General: NAD. Ambulated in the clinic without difficutly Neck: JVP 8 cm, no thyromegaly or thyroid nodule.  Lungs: Clear to  auscultation bilaterally with normal respiratory effort. CV: Nondisplaced PMI.  Heart regular S1/S2, no S3/S4, 2/6 HSM at apex.  Trace peripheral edema LEs.  No carotid bruit.  Normal pedal pulses.  Abdomen: Soft, nontender, no hepatosplenomegaly, no distention.  Skin: Vitiligo Neurologic: Alert and oriented x 3.  Psych: Flat affect. Extremities: No clubbing or cyanosis.  HEENT: Normal.   Assessment/Plan: 1. CAD: Stable s/p CABG. Continue ASA, statin.   2. Chronic systolic CHF: Ischemic CMP.  ECHO 06/12/2016 EF 65-25%.  Has Medtronic ICD. NYHA class II symptoms but volume overload by Optivol and exam.    - Continue Coreg 3.125 mg bid.  - Stop lisinopril given cough and start losartan 25 mg daily.  BMET in 2 wks. - Continue current spironolactone.  - Increase Lasix to 40 mg bid x 5 days then 40 qam/20 qpm.  3. Smoking: He has quit smoking since CABG. 4. Depression: On Celexa.  5. Hyperlipidemia: Good lipids 11/17, continue statin.      Follow up 4 weeks.   [name]  07/24/2016

## 2016-07-28 ENCOUNTER — Telehealth: Payer: Self-pay | Admitting: Internal Medicine

## 2016-07-28 NOTE — Telephone Encounter (Signed)
Brother calling to changed pharmacy [facility] [place] [facility] [place] [facility] [address]-

## 2016-07-28 NOTE — Telephone Encounter (Signed)
Pt's pharmacy was changed as pt requested.

## 2016-07-30 ENCOUNTER — Encounter: Payer: Medicare Other | Admitting: Nurse Practitioner

## 2016-07-30 ENCOUNTER — Other Ambulatory Visit (HOSPITAL_COMMUNITY): Payer: Self-pay | Admitting: *Deleted

## 2016-07-30 DIAGNOSIS — I5022 Chronic systolic (congestive) heart failure: Secondary | ICD-10-CM

## 2016-07-30 MED ORDER — FUROSEMIDE 20 MG PO TABS: ORAL_TABLET | ORAL | 3 refills | Status: DC

## 2016-07-30 MED ORDER — SPIRONOLACTONE 25 MG PO TABS: 25.0000 mg | ORAL_TABLET | Freq: Every day | ORAL | 6 refills | Status: DC

## 2016-07-30 MED ORDER — LOSARTAN POTASSIUM 25 MG PO TABS: 25.0000 mg | ORAL_TABLET | Freq: Every day | ORAL | 6 refills | Status: DC

## 2016-07-31 ENCOUNTER — Ambulatory Visit
Admission: RE | Admit: 2016-07-31 | Discharge: 2016-07-31 | Disposition: A | Discharge status: Home / Self Care | Payer: Medicare Other | Source: Ambulatory Visit | Attending: Internal Medicine | Admitting: Internal Medicine

## 2016-07-31 DIAGNOSIS — M47896 Other spondylosis, lumbar region: Secondary | ICD-10-CM | POA: Insufficient documentation

## 2016-07-31 DIAGNOSIS — M48061 Spinal stenosis, lumbar region without neurogenic claudication: Secondary | ICD-10-CM | POA: Diagnosis not present

## 2016-07-31 DIAGNOSIS — R29890 Loss of height: Secondary | ICD-10-CM | POA: Diagnosis not present

## 2016-07-31 DIAGNOSIS — M4807 Spinal stenosis, lumbosacral region: Secondary | ICD-10-CM | POA: Diagnosis not present

## 2016-07-31 DIAGNOSIS — M545 Low back pain: Secondary | ICD-10-CM

## 2016-07-31 DIAGNOSIS — M8938 Hypertrophy of bone, other site: Secondary | ICD-10-CM | POA: Insufficient documentation

## 2016-07-31 DIAGNOSIS — M5126 Other intervertebral disc displacement, lumbar region: Secondary | ICD-10-CM | POA: Diagnosis not present

## 2016-07-31 DIAGNOSIS — G8929 Other chronic pain: Secondary | ICD-10-CM | POA: Diagnosis not present

## 2016-08-04 ENCOUNTER — Ambulatory Visit (HOSPITAL_COMMUNITY)
Admission: RE | Admit: 2016-08-04 | Discharge: 2016-08-04 | Disposition: A | Discharge status: Home / Self Care | Payer: Medicare Other | Source: Ambulatory Visit | Attending: Cardiology | Admitting: Cardiology

## 2016-08-04 ENCOUNTER — Ambulatory Visit (INDEPENDENT_AMBULATORY_CARE_PROVIDER_SITE_OTHER): Payer: Medicare Other | Admitting: Internal Medicine

## 2016-08-04 ENCOUNTER — Other Ambulatory Visit: Payer: Self-pay | Admitting: Cardiology

## 2016-08-04 ENCOUNTER — Encounter: Payer: Self-pay | Admitting: Internal Medicine

## 2016-08-04 VITALS — BP 60/40 | HR 64 | Ht 72.0 in | Wt 168.0 lb

## 2016-08-04 DIAGNOSIS — I5023 Acute on chronic systolic (congestive) heart failure: Secondary | ICD-10-CM | POA: Diagnosis not present

## 2016-08-04 DIAGNOSIS — I5022 Chronic systolic (congestive) heart failure: Secondary | ICD-10-CM | POA: Diagnosis not present

## 2016-08-04 DIAGNOSIS — I255 Ischemic cardiomyopathy: Secondary | ICD-10-CM

## 2016-08-04 DIAGNOSIS — I5021 Acute systolic (congestive) heart failure: Secondary | ICD-10-CM

## 2016-08-04 DIAGNOSIS — Z95810 Presence of automatic (implantable) cardiac defibrillator: Secondary | ICD-10-CM

## 2016-08-04 LAB — BASIC METABOLIC PANEL WITH GFR
Anion gap: 8 (ref 5–15)
BUN: 51 mg/dL — ABNORMAL HIGH (ref 6–20)
CO2: 29 mmol/L (ref 22–32)
Calcium: 9.5 mg/dL (ref 8.9–10.3)
Chloride: 99 mmol/L — ABNORMAL LOW (ref 101–111)
Creatinine, Ser: 1.77 mg/dL — ABNORMAL HIGH (ref 0.61–1.24)
GFR calc Af Amer: 45 mL/min — ABNORMAL LOW (ref >60)
GFR calc non Af Amer: 39 mL/min — ABNORMAL LOW (ref >60)
Glucose, Bld: 73 mg/dL (ref 65–99)
Potassium: 4.5 mmol/L (ref 3.5–5.1)
Sodium: 136 mmol/L (ref 135–145)

## 2016-08-04 LAB — CUP PACEART INCLINIC DEVICE CHECK
Battery Remaining Longevity: 131 mo
Battery Voltage: 3.03 V
Brady Statistic RV Percent Paced: 0.2 %
Date Time Interrogation Session: 20171211162748
HighPow Impedance: 76 Ohm
Implantable Lead Implant Date: 20160906
Implantable Lead Location: 753860
Implantable Pulse Generator Implant Date: 20160906
Lead Channel Impedance Value: 323 Ohm
Lead Channel Impedance Value: 399 Ohm
Lead Channel Pacing Threshold Amplitude: 0.75 V
Lead Channel Pacing Threshold Pulse Width: 0.4 ms
Lead Channel Sensing Intrinsic Amplitude: 6.375 mV
Lead Channel Sensing Intrinsic Amplitude: 8.25 mV
Lead Channel Setting Pacing Amplitude: 2.5 V
Lead Channel Setting Pacing Pulse Width: 0.4 ms
Lead Channel Setting Sensing Sensitivity: 0.3 mV

## 2016-08-04 MED ORDER — FUROSEMIDE 40 MG PO TABS: 40.0000 mg | ORAL_TABLET | Freq: Every day | ORAL | 6 refills | Status: DC

## 2016-08-04 NOTE — Patient Instructions (Addendum)
Medication Instructions:  Your physician has recommended you make the following change in your medication:  1) HOLD Lasix for 3 days HOLD Tuesday/Wednesday/Thursday 2) After 3 days, Lasix 40 mg daily (Start on 08/08/16)   Labwork: None Ordered   Testing/Procedures: None Ordered   Follow-Up: Your physician wants you to follow-up in: 12 months with [name], NP. You will receive a reminder letter in the mail two months in advance. If you don't receive a letter, please call our office to schedule the follow-up appointment.  Remote monitoring is used to monitor your ICD from home. This monitoring reduces the number of office visits required to check your device to one time per year. It allows [place] to keep an eye on the functioning of your device to ensure it is working properly. You are scheduled for a device check from home on 11/03/16. You may send your transmission at any time that day. If you have a wireless device, the transmission will be sent automatically. After your physician reviews your transmission, you will receive a postcard with your next transmission date.    Any Other Special Instructions Will Be Listed Below (If Applicable). Margarita Grizzle will be in contact with you for ICM monitoring    If you need a refill on your cardiac medications before your next appointment, please call your pharmacy.

## 2016-08-04 NOTE — Progress Notes (Signed)
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 male who presents today for routine electrophysiology followup.  Since his last visit, the patient reports doing reasonably well. He was recently seen by Dr [name] and had increase dosing of his diuretics due to CHF.  He now presents with low BP but is not very symptomatic. Today, he denies symptoms of palpitations, chest pain,  lower extremity edema, dizziness, presyncope, syncope, or ICD shocks.  The patient is otherwise without complaint today.   Past Medical History:  Diagnosis Date  . Arthritis   . Brunner's gland hyperplasia of duodenum   . CHF (congestive heart failure) ([place])   . Coronary artery disease   . External hemorrhoids   . GERD (gastroesophageal reflux disease)   . Hearing loss   . HH (hiatus hernia)   . Internal hemorrhoids   . Ischemic cardiomyopathy   . Leg fracture, left   . Myocardial infarction   . Paronychia   . Psoriasis   . PUD (peptic ulcer disease)   . Schatzki's ring   . Tubular adenoma of colon   . Vitiligo    Past Surgical History:  Procedure Laterality Date  . CORONARY ARTERY BYPASS GRAFT N/A 08/03/2014   Procedure: CORONARY ARTERY BYPASS GRAFTING (CABG);  Surgeon: [name], MD;  Location: [facility];  Service: Open Heart Surgery;  Laterality: N/A;  . EP IMPLANTABLE DEVICE N/A 05/01/2015   MDT ICD implanted for primary prevention of sudden death  . ESOPHAGOGASTRODUODENOSCOPY (EGD) WITH PROPOFOL N/A 04/16/2015   Procedure: ESOPHAGOGASTRODUODENOSCOPY (EGD) WITH PROPOFOL;  Surgeon: [name], MD;  Location: [facility] ENDOSCOPY;  Service: Endoscopy;  Laterality: N/A;  . HERNIA REPAIR    . TEE WITHOUT CARDIOVERSION N/A 08/03/2014   Procedure: TRANSESOPHAGEAL ECHOCARDIOGRAM (TEE);  Surgeon: [name], MD;  Location: [place];  Service: Open Heart Surgery;  Laterality: N/A;    ROS- all systems are reviewed and negative except as per HPI above  Current  Outpatient Prescriptions  Medication Sig Dispense Refill  . aspirin 81 MG tablet Take 81 mg by mouth 2 (two) times daily.    Marland Kitchen atorvastatin (LIPITOR) 40 MG tablet Take 1 tablet (40 mg total) by mouth daily at 6 PM. 30 tablet 1  . carvedilol (COREG) 3.125 MG tablet Take 3.125 mg by mouth 2 (two) times daily with a meal.    . citalopram (CELEXA) 20 MG tablet Take 1 tablet by mouth daily.  5  . cyanocobalamin 500 MCG tablet Take 500 mcg by mouth daily.    . ferrous sulfate 325 (65 FE) MG tablet Take 325 mg by mouth daily with breakfast.    . lidocaine (XYLOCAINE) 2 % jelly Apply 1 application topically daily as needed for irritation.  3  . lisinopril (PRINIVIL,ZESTRIL) 2.5 MG tablet Take 1 tablet by mouth daily.  5  . losartan (COZAAR) 25 MG tablet Take 1 tablet (25 mg total) by mouth daily. In Evening 30 tablet 6  . meloxicam (MOBIC) 7.5 MG tablet Take 1 tablet by mouth daily.  1  . omeprazole (PRILOSEC) 20 MG capsule Take 20 mg by mouth 2 (two) times daily.    Marland Kitchen spironolactone (ALDACTONE) 25 MG tablet Take 1 tablet (25 mg total) by mouth daily. Take in Evening 30 tablet 6  . tiZANidine (ZANAFLEX) 4 MG tablet Take 4 mg by mouth every 6 (six) hours as needed for muscle spasms.    . traMADol (ULTRAM) 50 MG  tablet Take 50 mg by mouth every 6 (six) hours as needed.    . furosemide (LASIX) 40 MG tablet Take 1 tablet (40 mg total) by mouth daily. 30 tablet 6   No current facility-administered medications for this visit.     Physical Exam: Vitals:   08/04/16 1444  BP: (!) 60/40  Pulse: 64  SpO2: 94%  Weight: 168 lb (76.2 kg)  Height: 6' (1.829 m)  repeat BP by MD is 82/58  GEN- The patient is well appearing, alert and oriented x 3 today.   Head- normocephalic, atraumatic Eyes-  Sclera clear, conjunctiva pink Ears- hearing intact Oropharynx- clear Lungs- Clear to ausculation bilaterally, normal work of breathing Chest- ICD pocket is well healed Heart- Regular rate and rhythm, no murmurs,  rubs or gallops, PMI not laterally displaced GI- soft, NT, ND, + BS Extremities- no clubbing, cyanosis, or edema  ICD interrogation- reviewed in detail today,  See PACEART report  Assessment and Plan:  1.  Chronic systolic dysfunction Dry on exam today optivol reveals abrupt spike in impedance trend suggesting abrupt diuresis.   Given low BP, I have spoken with Dr [name].  Will hold lasix x 3 days then restart lasix 40mg  daily Normal ICD function See Pace Art report No changes today Will enroll in ICM device clinic bmet from today reviewed  2. LA enlargement ICD single chamber Visia AF algorithm  No afib seen  [facility] Return to see EP NP in 1 year  Thompson Grayer MD, [facility] 08/04/2016 8:55 PM

## 2016-08-05 ENCOUNTER — Telehealth (HOSPITAL_COMMUNITY): Payer: Self-pay | Admitting: *Deleted

## 2016-08-05 ENCOUNTER — Other Ambulatory Visit (HOSPITAL_COMMUNITY): Payer: Medicare Other

## 2016-08-05 NOTE — Telephone Encounter (Signed)
Left Vm to get patient scheduled this week with Jonni Sanger and no.    Message routed to [place] to get him seen this week per Bensim[place].   Notes Recorded by [name], MD on 08/04/2016 at 4:05 PM EST Needs followup this week with Jonni Sanger.    Ref Range & Units 1d ago (08/04/16) 58mo ago (06/19/16) 27mo ago (06/12/16)   Sodium 135 - 145 mmol/L 136  138  138    Potassium 3.5 - 5.1 mmol/L 4.5  4.1  3.6    Chloride 101 - 111 mmol/L 99   98   102    CO2 22 - 32 mmol/L 29  30  29     Glucose, Bld 65 - 99 mg/dL 73  115   115     BUN 6 - 20 mg/dL 51   21   31     Creatinine, Ser 0.61 - 1.24 mg/dL 1.77   1.37   1.47     Calcium 8.9 - 10.3 mg/dL 9.5  9.9  8.7     GFR calc non Af Amer >60 mL/min 39   53   49     GFR calc Af Amer >60 mL/min 45   >60CM  56CM

## 2016-08-05 NOTE — Telephone Encounter (Signed)
-----   Message from [name], MD sent at 08/04/2016  4:05 PM EST ----- Needs followup this week with Jonni Sanger.

## 2016-08-05 NOTE — Progress Notes (Signed)
Needs followup appt with Jonni Sanger this week, discussed with Megan.

## 2016-08-19 ENCOUNTER — Telehealth: Payer: Self-pay

## 2016-08-19 ENCOUNTER — Ambulatory Visit (INDEPENDENT_AMBULATORY_CARE_PROVIDER_SITE_OTHER): Payer: Medicare Other

## 2016-08-19 DIAGNOSIS — I5022 Chronic systolic (congestive) heart failure: Secondary | ICD-10-CM

## 2016-08-19 DIAGNOSIS — Z95810 Presence of automatic (implantable) cardiac defibrillator: Secondary | ICD-10-CM

## 2016-08-19 NOTE — Progress Notes (Signed)
Attempted 2nd call to patient on home phone and left message for return call.

## 2016-08-19 NOTE — Telephone Encounter (Signed)
Attempted 2nd ICM call and left message on answering machine.

## 2016-08-19 NOTE — Telephone Encounter (Signed)
Remote ICM transmission received.  Attempted patient call and no answer 

## 2016-08-19 NOTE — Progress Notes (Signed)
EPIC Encounter for ICM Monitoring  Patient Name: Carl [name] is a 65 y.o. male Date: 08/19/2016 Primary Care Physican: [name], MD Primary Cardiologist:[place] Electrophysiologist: Allred Dry Weight:     unknown       Attempted 1st call to patient and no answer on cell phone.    Thoracic impedance abnormal suggesting fluid accumulation since 08/03/2016 and fluid index > threshold.  At last office visit with [name] 12/11, he was instructed to hold Lasix x 3 days and then start Lasix 40 mg daily.  Will check if patient restarted the Lasix as instructed when patient reached.   Labs: 08/04/2016 Creatinine 1.77, BUN 51, Potassium 4.5, Sodium 136, EGFR 39-45 06/19/2016 Creatinine 1.37, BUN 21, Potassium 4.1, Sodium 138, EGFR 53->60 06/12/2016 Creatinine 1.47, BUN 31, Potassium 3.6, Sodium 138, EGFR 49-56  06/11/2016 Creatinine 1.95, BUN 40, Potassium 3.4, Sodium 138, EGFR 35-40  06/10/2016 Creatinine 2.28, BUN 46, Potassium 3.8, Sodium 139, EGFR 29-33  03/06/2016 Creatinine 0.97, BUN 16, Potassium 3.6, Sodium 139, EGFR >60->60   Recommendations:  NONE due to unable to reach patient.    Follow-up plan: ICM clinic phone appointment on 08/21/2016 to recheck fluid levels.  HF office appointment for 08/26/2016.  Copy of ICM check sent to Dr [name] and [name].   ICM trend: 08/19/2016       Rosalene Billings, RN 08/19/2016 8:12 AM

## 2016-08-19 NOTE — Progress Notes (Signed)
If taking Lasix 40 daily, needs to increase to 40 bid x 4 days then 40 qam/20 qpm.  Needs BMET 1 week.

## 2016-08-20 MED ORDER — FUROSEMIDE 40 MG PO TABS: ORAL_TABLET | ORAL | 6 refills | Status: DC

## 2016-08-20 NOTE — Addendum Note (Signed)
Addended by: Scarlette Calico on: 08/20/2016 12:12 PM   Modules accepted: Orders

## 2016-08-20 NOTE — Progress Notes (Signed)
Pt's brother called back, he states pt has been taking Lasix 40 mg daily, he will have pt increase to 40 BID for 4 days then go to 40/20 until his appt with [place] on 08/26/16, will recheck labs at that time.

## 2016-08-20 NOTE — Telephone Encounter (Signed)
[name], MD at 08/19/2016 2:40 PM   Status: Signed    If taking Lasix 40 daily, needs to increase to 40 bid x 4 days then 40 qam/20 qpm.  Needs BMET 1 week.     Scarlette Calico, RN at 08/19/2016 2:40 PM   Status: Signed    Spoke w/pt's brother, [name], he is unsure if pt has been taking Lasix or not, pt's mom manages his meds.  He will f/u with her and one of them will call us back to let us know what he has been doing and for further advice on what he should do.    Scarlette Calico, RN at 08/19/2016 2:40 PM   Status: Signed    Pt's brother called back, he states pt has been taking Lasix 40 mg daily, he will have pt increase to 40 BID for 4 days then go to 40/20 until his appt with [place] on 08/26/16, will recheck labs at that time.

## 2016-08-20 NOTE — Progress Notes (Signed)
Spoke w/pt's brother, [name], he is unsure if pt has been taking Lasix or not, pt's mom manages his meds.  He will f/u with her and one of them will call us back to let us know what he has been doing and for further advice on what he should do.

## 2016-08-21 ENCOUNTER — Ambulatory Visit (INDEPENDENT_AMBULATORY_CARE_PROVIDER_SITE_OTHER): Payer: Medicare Other

## 2016-08-21 DIAGNOSIS — Z95810 Presence of automatic (implantable) cardiac defibrillator: Secondary | ICD-10-CM

## 2016-08-21 DIAGNOSIS — I5022 Chronic systolic (congestive) heart failure: Secondary | ICD-10-CM

## 2016-08-21 NOTE — Progress Notes (Signed)
EPIC Encounter for ICM Monitoring  Patient Name: Carl [name] is a 65 y.o. male Date: 08/21/2016 Primary Care Physican: [name], MD Primary Cardiologist:[place] Electrophysiologist: Allred Dry Weight:165 lbs       Spoke with patient and mother.  Heart Failure questions reviewed, pt reported he does not think he is having fluid symptoms but thought his legs may be swollen.  Difficulty assessing if patient is having fluid symptoms.  Mother handles all the medications  Thoracic impedance continues to be abnormal suggesting fluid accumulation.  Labs: 08/04/2016 Creatinine 1.77, BUN 51, Potassium 4.5, Sodium 136, EGFR 39-45 06/19/2016 Creatinine 1.37, BUN 21, Potassium 4.1, Sodium 138, EGFR 53->60 06/12/2016 Creatinine 1.47, BUN 31, Potassium 3.6, Sodium 138, EGFR 49-56  06/11/2016 Creatinine 1.95, BUN 40, Potassium 3.4, Sodium 138, EGFR 35-40  06/10/2016 Creatinine 2.28, BUN 46, Potassium 3.8, Sodium 139, EGFR 29-33  03/06/2016 Creatinine 0.97, BUN 16, Potassium 3.6, Sodium 139, EGFR >60->60   Recommendations:  Mother said she spoke to Carl [name], patients brother, about the change in Furosemide 12/26 but she has not increased the Furosemide dosage.  She thought he was supposed to take 1 tablet 40 mg daily.   Advised Dr [name] (see note on 12/26) ordered to increase Furosemide 40 mg to 1 tablet twice a day x 4 days.  She had difficulty follow directions for the increased dosage.  Reviewed several times to start the increase today through - Sunday.  Advised on Monday, Jan 1 that patient should take 1 tablet in the morning and 1/2 tablet in the afternoon and on Tuesday he should do the same.  Reviewed the medication changes numerous times and she did repeat back the correct medication changes.  Advised they will discuss with her at the HF clinic appointment on 1/2 regarding his medications.   Discussed food choices and when asked if he eats a lot of salty foods such as potato chips she said  no but then said maybe he should cut those out.  Unsure if following low salt diet.   She reported she has a list of foods that he should eat.  Educational material mailed to patient on Low Sodium Diet.  Follow-up plan: ICM clinic phone appointment on 09/02/2016 to recheck fluid levels.   HF clinic office appointment on 08/26/2016.    Copy of ICM check sent to primary cardiologist and device physician.   3 month ICM trend : 08/21/2016   1 Year ICM trend:      Rosalene Billings, RN 08/21/2016 10:51 AM

## 2016-08-26 ENCOUNTER — Encounter: Payer: Self-pay | Admitting: Internal Medicine

## 2016-08-26 ENCOUNTER — Ambulatory Visit (HOSPITAL_COMMUNITY)
Admission: RE | Admit: 2016-08-26 | Discharge: 2016-08-26 | Disposition: A | Discharge status: Home / Self Care | Payer: Medicare Other | Source: Ambulatory Visit | Attending: Internal Medicine | Admitting: Internal Medicine

## 2016-08-26 VITALS — BP 92/62 | HR 71 | Wt 167.0 lb

## 2016-08-26 DIAGNOSIS — Z951 Presence of aortocoronary bypass graft: Secondary | ICD-10-CM | POA: Diagnosis not present

## 2016-08-26 DIAGNOSIS — I2510 Atherosclerotic heart disease of native coronary artery without angina pectoris: Secondary | ICD-10-CM | POA: Diagnosis not present

## 2016-08-26 DIAGNOSIS — Z7982 Long term (current) use of aspirin: Secondary | ICD-10-CM | POA: Diagnosis not present

## 2016-08-26 DIAGNOSIS — F329 Major depressive disorder, single episode, unspecified: Secondary | ICD-10-CM | POA: Insufficient documentation

## 2016-08-26 DIAGNOSIS — I255 Ischemic cardiomyopathy: Secondary | ICD-10-CM | POA: Insufficient documentation

## 2016-08-26 DIAGNOSIS — Z9889 Other specified postprocedural states: Secondary | ICD-10-CM | POA: Insufficient documentation

## 2016-08-26 DIAGNOSIS — Z95810 Presence of automatic (implantable) cardiac defibrillator: Secondary | ICD-10-CM | POA: Diagnosis not present

## 2016-08-26 DIAGNOSIS — E785 Hyperlipidemia, unspecified: Secondary | ICD-10-CM | POA: Insufficient documentation

## 2016-08-26 DIAGNOSIS — L80 Vitiligo: Secondary | ICD-10-CM | POA: Insufficient documentation

## 2016-08-26 DIAGNOSIS — I5022 Chronic systolic (congestive) heart failure: Secondary | ICD-10-CM | POA: Diagnosis not present

## 2016-08-26 DIAGNOSIS — N182 Chronic kidney disease, stage 2 (mild): Secondary | ICD-10-CM

## 2016-08-26 DIAGNOSIS — Z87891 Personal history of nicotine dependence: Secondary | ICD-10-CM | POA: Insufficient documentation

## 2016-08-26 DIAGNOSIS — I519 Heart disease, unspecified: Secondary | ICD-10-CM

## 2016-08-26 MED ORDER — FUROSEMIDE 40 MG PO TABS: 40.0000 mg | ORAL_TABLET | Freq: Two times a day (BID) | ORAL | 6 refills | Status: DC

## 2016-08-26 NOTE — Progress Notes (Signed)
Patient ID: Carl [name], male   DOB: 06-26-1951, 66 y.o.   MRN: [id number] PCP: Dr. Ginette Pitman  66 yo with history of smoking and mild mental retardation was admitted to [facility] in 12/15 with dyspnea.  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  LHC showed 3 vessel disease with EF 15%.  Echo showed EF 15-20%.  Patient had CABG x 5.  It was difficult to wean him off pressors post-op.  He ended up having to start midodrine.  This was weaned off.  Admitted 10/17 with volume overload and AKI.Diuresed with IV lasix and transitiioned to 40 mg lasix daily. Spiro, dig, lisinopril stopped due to elevated creatinine 2.28. Discharge weight 163 pounds.    He returns for follow up.  Complains of back pain. Last visit lisinopril was stopped and losartan was started. Denies SOB/PND/CP.  His mother prepares all medications.   Optivol: Fluid index > threshold, impedance has decreased. Actiivity ~2 hours per day  Labs (12/15): K 4.1, creatinine 0.81, hgb 9.1 Labs (09/12/2014): K 3.7 Creatinine 0.96, digoxin 0.7 Labs (3/16): digoxin 0.8, LDL 62, HDL 40 Labs (4/16): K 4.3, creatinine 1.1 Labs (5/16) K 4.6, creatinine 1.24, digoxin 1.0 Labs (05/2016): K 3.6 Creatinine 1.47  => 1.37 Labs (11/17): LDL 61, HDL 41, K 4.2, creaitnine 1.1  PMH: 1. Smoker 2. Mild mental retardation 3. CAD: LHC (12/15) with 3 vessel disease.  CABG x 5 in 12/15 with LIMA-LAD, SVG-D1, sequential SVG-OM2/OM3, SVG-PDA.   4. Ischemic cardiomyopathy: Echo (12/15) with EF 15-20%, wall motion abnormalities, mildly decreased RV systolic function, mild MR.  Echo (3/16) with EF 30-35%, severe LV dilation, moderate MR, PA systolic pressure 42 mmHg. Echo (8/16) with EF 25-30%, severely dilated LV, diffuse hypokinesis with inferior akinesis, restrictive diastolic function, RV mildly dilated with mildly decreased systolic function, moderate MR.  - Echo (10/17): EF 20-25%, moderate MR.  - Medtronic ICD.  - ACEI cough.  5. Depression 6. Vitiligo  SH: Lives with  mother, quit smoking in 12/15.  No ETOH.   FH: Brother and father with MIs.    ROS: All systems reviewed and negative except as per HPI.   Current Outpatient Prescriptions  Medication Sig Dispense Refill  . aspirin 81 MG tablet Take 81 mg by mouth 2 (two) times daily.    Marland Kitchen atorvastatin (LIPITOR) 40 MG tablet Take 1 tablet (40 mg total) by mouth daily at 6 PM. 30 tablet 1  . carvedilol (COREG) 3.125 MG tablet Take 3.125 mg by mouth 2 (two) times daily with a meal.    . citalopram (CELEXA) 20 MG tablet Take 1 tablet by mouth daily.  5  . cyanocobalamin 500 MCG tablet Take 500 mcg by mouth daily.    . ferrous sulfate 325 (65 FE) MG tablet Take 325 mg by mouth daily with breakfast.    . furosemide (LASIX) 40 MG tablet Take 1 tab in AM and 1/2 tab in PM 30 tablet 6  . lidocaine (XYLOCAINE) 2 % jelly Apply 1 application topically daily as needed for irritation.  3  . lisinopril (PRINIVIL,ZESTRIL) 2.5 MG tablet Take 1 tablet by mouth daily.  5  . losartan (COZAAR) 25 MG tablet Take 1 tablet (25 mg total) by mouth daily. In Evening 30 tablet 6  . meloxicam (MOBIC) 7.5 MG tablet Take 1 tablet by mouth daily.  1  . omeprazole (PRILOSEC) 20 MG capsule Take 20 mg by mouth 2 (two) times daily.    Marland Kitchen spironolactone (ALDACTONE) 25 MG tablet  Take 1 tablet (25 mg total) by mouth daily. Take in Evening 30 tablet 6  . tiZANidine (ZANAFLEX) 4 MG tablet Take 4 mg by mouth every 6 (six) hours as needed for muscle spasms.    . traMADol (ULTRAM) 50 MG tablet Take 50 mg by mouth every 6 (six) hours as needed.     No current facility-administered medications for this encounter.    BP 92/62 (BP Location: Left Arm, Patient Position: Sitting, Cuff Size: Normal)   Pulse 71   Wt 167 lb (75.8 kg)   SpO2 98%   BMI 22.65 kg/m   Filed Weights   08/26/16 1347  Weight: 167 lb (75.8 kg)     General: NAD. Ambulated in the clinic without difficutly Neck: JVP 9-10 cm, no thyromegaly or thyroid nodule.  Lungs: Clear to  auscultation bilaterally with normal respiratory effort. CV: Nondisplaced PMI.  Heart regular S1/S2, no S3/S4, 2/6 HSM at apex.  No edema.  No carotid bruit.  Normal pedal pulses.  Abdomen: Soft, nontender, no hepatosplenomegaly, no distention.  Skin: Vitiligo Neurologic: Alert and oriented x 3.  Psych: Flat affect. Extremities: No clubbing or cyanosis.  HEENT: Normal.     Assessment/Plan: 1. CAD: Stable s/p CABG. Continue ASA, statin.   2. Chronic systolic CHF: Ischemic CMP.  ECHO 06/12/2016 EF 20-25%.  Has Medtronic ICD. NYHA class II symptoms but volume overload by Optivol and exam.    - Continue Coreg 3.125 mg bid. Will not increase with soft BP.  - Continue losartan 25 mg daily.  - Continue current spironolactone.  - Increase lasix to 40 mg twice a day.   3. Smoking: He has quit smoking since CABG. 4. Depression: On Celexa.  5. Hyperlipidemia: Good lipids 11/17, continue statin.      He is difficult to manage because he does not know the name of his medications and his mother does not come to follow up appointments.   Follow up 6 weeks with an ECHO and Dr [name].    Amy Clegg  NP-C  08/26/2016

## 2016-08-26 NOTE — Patient Instructions (Signed)
INCREASE Lasix to 40 mg (1 tab) twice daily.  Follow up 6 weeks with Dr. [name] and echocardiogram.  Do the following things EVERYDAY: 1) Weigh yourself in the morning before breakfast. Write it down and keep it in a log. 2) Take your medicines as prescribed 3) Eat low salt foods-Limit salt (sodium) to 2000 mg per day.  4) Stay as active as you can everyday 5) Limit all fluids for the day to less than 2 liters

## 2016-09-02 ENCOUNTER — Ambulatory Visit (INDEPENDENT_AMBULATORY_CARE_PROVIDER_SITE_OTHER): Payer: Medicare Other

## 2016-09-02 DIAGNOSIS — Z95810 Presence of automatic (implantable) cardiac defibrillator: Secondary | ICD-10-CM

## 2016-09-02 DIAGNOSIS — I5022 Chronic systolic (congestive) heart failure: Secondary | ICD-10-CM

## 2016-09-02 MED ORDER — FUROSEMIDE 40 MG PO TABS: ORAL_TABLET | ORAL | 3 refills | Status: DC

## 2016-09-02 NOTE — Progress Notes (Signed)
EPIC Encounter for ICM Monitoring  Patient Name: Carl [name] is a 66 y.o. male Date: 09/02/2016 Primary Care Physican: [name], MD Primary Cardiologist:[place] Electrophysiologist: Allred Dry Weight:unknown                                                 Spoke with mother. She stated patient is doing fine and denied he is having any fluid symptoms.  Patient is seeing a new doctor today but she nor patient knew why he needed to go.  Per epic, the physician, Dr Lacinda Axon, is a neurosurgeon.  Thoracic impedance continues to be abnormal suggesting fluid accumulation and fluid index > 200.  Slight impedance improvement on 1/4.  Confirmed with mother that patient is taking Lasix 40 mg bid as instructed at last HF appt on 08/26/16.  Labs: 08/04/2016 Creatinine 1.77, BUN 51, Potassium 4.5, Sodium 136, EGFR 39-45 06/19/2016 Creatinine 1.37, BUN 21, Potassium 4.1, Sodium 138, EGFR 53->60 06/12/2016 Creatinine 1.47, BUN 31, Potassium 3.6, Sodium 138, EGFR 49-56  06/11/2016 Creatinine 1.95, BUN 40, Potassium 3.4, Sodium 138, EGFR 35-40  06/10/2016 Creatinine 2.28, BUN 46, Potassium 3.8, Sodium 139, EGFR 29-33  03/06/2016 Creatinine 0.97, BUN 16, Potassium 3.6, Sodium 139, EGFR >60->60   Recommendations:  Advised would send copy of note to Dr [name] and [name] for review and if any recommendations will call her back.     Follow-up plan: ICM clinic phone appointment on 09/11/2016 to recheck fluid levels.    3 month ICM trend : 09/02/2016   1 Year ICM trend:      Rosalene Billings, RN 09/02/2016 10:23 AM

## 2016-09-02 NOTE — Progress Notes (Signed)
Call to patient and spoke with mother.  She handles his meds. Advised Dr [name] wants to increase Lasix to 60 mg bid.  Explained patient has 40 mg tablets so it would be 1 1/2 tablets twice a day.  She repeated the information back and was correct.  Advised BMET needs to be drawn next week and he will have it drawn on 09/09/2016.  Advised the HF clinic will call regarding a follow up appointment.

## 2016-09-02 NOTE — Progress Notes (Signed)
Increase Lasix to 60 mg bid with BMET in 1 week and arrange CHF clinic followup.

## 2016-09-11 ENCOUNTER — Ambulatory Visit (INDEPENDENT_AMBULATORY_CARE_PROVIDER_SITE_OTHER): Payer: Medicare Other

## 2016-09-11 DIAGNOSIS — I5022 Chronic systolic (congestive) heart failure: Secondary | ICD-10-CM

## 2016-09-11 DIAGNOSIS — Z95810 Presence of automatic (implantable) cardiac defibrillator: Secondary | ICD-10-CM

## 2016-09-12 NOTE — Progress Notes (Signed)
EPIC Encounter for ICM Monitoring  Patient Name: Carl [name] is a 66 y.o. male Date: 09/12/2016 Primary Care Physican: [name], MD Primary Cardiologist:[place] Electrophysiologist: Allred Dry Weight:unknown      Spoke with mother.  Heart Failure questions reviewed, pt asymptomatic.  Mother remains confused regarding how much Furosemide should be taking. She was supposed to have increased the Furosemide 40 mg to 1 1/2 tablets (60 mg total) bid and she did not increase the medication.  Patient has been on Furosemide 40 mg once a day.    Patient did not follow through on getting lab work done on 09/09/2016.       Thoracic impedance continues to be abnormal suggesting fluid accumulation.  Labs: 08/04/2016 Creatinine 1.77, BUN 51, Potassium 4.5, Sodium 136, EGFR 39-45 06/19/2016 Creatinine 1.37, BUN 21, Potassium 4.1, Sodium 138, EGFR 53->60 06/12/2016 Creatinine 1.47, BUN 31, Potassium 3.6, Sodium 138, EGFR 49-56  06/11/2016 Creatinine 1.95, BUN 40, Potassium 3.4, Sodium 138, EGFR 35-40  06/10/2016 Creatinine 2.28, BUN 46, Potassium 3.8, Sodium 139, EGFR 29-33  03/06/2016 Creatinine 0.97, BUN 16, Potassium 3.6, Sodium 139, EGFR >60->60   Recommendations:   Advised to increase the Furosemide to 60 mg bid as Dr [name] had instructed at last ICM check on 09/02/2016 and to have labs drawn on 09/15/2016.  Advised would call back if there are any further recommendations regarding changes in medication.   Copy of ICM check sent to Dr [name] and [name] for review.     Follow-up plan: ICM clinic phone appointment on 09/19/2016 to recheck fluid levels.   Appointment with Dr [name] on 10/07/2016.     3 month ICM trend: 09/11/2016   1 Year ICM trend:      Rosalene Billings, RN 09/12/2016 11:44 AM

## 2016-09-15 ENCOUNTER — Ambulatory Visit (HOSPITAL_COMMUNITY)
Admission: RE | Admit: 2016-09-15 | Discharge: 2016-09-15 | Disposition: A | Discharge status: Home / Self Care | Payer: Medicare Other | Source: Ambulatory Visit | Attending: Cardiology | Admitting: Cardiology

## 2016-09-15 DIAGNOSIS — I5022 Chronic systolic (congestive) heart failure: Secondary | ICD-10-CM | POA: Insufficient documentation

## 2016-09-15 LAB — BASIC METABOLIC PANEL
ANION GAP: 10 (ref 5–15)
BUN: 32 mg/dL — ABNORMAL HIGH (ref 6–20)
CALCIUM: 9 mg/dL (ref 8.9–10.3)
CO2: 26 mmol/L (ref 22–32)
Chloride: 104 mmol/L (ref 101–111)
Creatinine, Ser: 1.6 mg/dL — ABNORMAL HIGH (ref 0.61–1.24)
GFR, EST AFRICAN AMERICAN: 51 mL/min — AB (ref >60)
GFR, EST NON AFRICAN AMERICAN: 44 mL/min — AB (ref >60)
GLUCOSE: 104 mg/dL — AB (ref 65–99)
POTASSIUM: 3.5 mmol/L (ref 3.5–5.1)
Sodium: 140 mmol/L (ref 135–145)

## 2016-09-19 ENCOUNTER — Telehealth: Payer: Self-pay

## 2016-09-19 ENCOUNTER — Ambulatory Visit (INDEPENDENT_AMBULATORY_CARE_PROVIDER_SITE_OTHER): Payer: Medicare Other

## 2016-09-19 DIAGNOSIS — Z95810 Presence of automatic (implantable) cardiac defibrillator: Secondary | ICD-10-CM

## 2016-09-19 DIAGNOSIS — I5023 Acute on chronic systolic (congestive) heart failure: Secondary | ICD-10-CM

## 2016-09-19 NOTE — Progress Notes (Signed)
Attempted another call back to mother/patient and no answer.

## 2016-09-19 NOTE — Telephone Encounter (Signed)
Attempted 3rd ICM call to mother and patient and no answer

## 2016-09-19 NOTE — Telephone Encounter (Signed)
Unable to reach patient or mother after 4 attempts today.

## 2016-09-19 NOTE — Telephone Encounter (Signed)
Attempted 2nd ICM call today regarding remote transmission.  No answer.

## 2016-09-19 NOTE — Telephone Encounter (Signed)
Attempted ICM call to either patient or mother.  Requested call back regarding transmission report.  Provided call back number.

## 2016-09-19 NOTE — Progress Notes (Signed)
EPIC Encounter for ICM Monitoring  Patient Name: Carl [name] is a 66 y.o. male Date: 09/19/2016 Primary Care Physican: [name], MD Primary Cardiologist:[place] Electrophysiologist: Allred Dry Weight:unknown         Attempted 3 calls to patient or mother today and unable to reach.  Left message to return call regarding transmission.  Transmission reviewed.   Thoracic impedance continues to be abnormal suggesting fluid accumulation.   Follow up call to confirm with mother that she started patient on correct Furosemide dosage of 60 mg bid as discussed on 09/11/2016.    Labs: 09/15/2016 Creatinine 1.60, BUN 32, Potassium 3.5, Sodium 140, EGFR 44-51 08/04/2016 Creatinine 1.77, BUN 51, Potassium 4.5, Sodium 136, EGFR 39-45 06/19/2016 Creatinine 1.37, BUN 21, Potassium 4.1, Sodium 138, EGFR 53->60 06/12/2016 Creatinine 1.47, BUN 31, Potassium 3.6, Sodium 138, EGFR 49-56  06/11/2016 Creatinine 1.95, BUN 40, Potassium 3.4, Sodium 138, EGFR 35-40  06/10/2016 Creatinine 2.28, BUN 46, Potassium 3.8, Sodium 139, EGFR 29-33  03/06/2016 Creatinine 0.97, BUN 16, Potassium 3.6, Sodium 139, EGFR >60->60   Recommendations: NONE - Unable to reach patient   Follow-up plan: ICM clinic phone appointment on 09/25/2016 to recheck fluid levels.          Office appointment with Dr [name] 10/07/2016  Copy of ICM check sent to primary cardiologist and device physician.   3 month ICM trend: 09/19/2016   1 Year ICM trend:      Rosalene Billings, RN 09/19/2016 7:54 AM

## 2016-09-19 NOTE — Progress Notes (Signed)
Received voice mail from mother returning the call.

## 2016-09-20 NOTE — Progress Notes (Signed)
If he has been taking Lasix 60 mg bid, increase to 80 mg bid with BMET in 1 week.

## 2016-09-22 MED ORDER — FUROSEMIDE 40 MG PO TABS: ORAL_TABLET | ORAL | 3 refills | Status: DC

## 2016-09-22 NOTE — Addendum Note (Signed)
Addended by: Rosalene Billings on: 09/22/2016 09:09 AM   Modules accepted: Orders

## 2016-09-22 NOTE — Progress Notes (Signed)
Spoke with mother.  Confirmed patient has been taking Lasix 60 mg bid since last conversation on 09/11/2016. Reviewed transmission results and advised Dr [name] recommended to increase Lasix to 80 mg bid and BMET scheduled in 1 week, Monday, 09/29/2016 at [facility] street office.  She repeated instructions twice and verbalized she understood.   Advised to call if she has any questions about his medications.    She stated patient is feeling tired and wanting to sleep a lot but denied any shortness of breath or lower extremity swelling.   ICM remote transmission scheduled 09/26/2016 to recheck fluid levels.

## 2016-09-25 DEATH — deceased

## 2016-09-26 ENCOUNTER — Ambulatory Visit (INDEPENDENT_AMBULATORY_CARE_PROVIDER_SITE_OTHER): Payer: Medicare Other

## 2016-09-26 DIAGNOSIS — Z95810 Presence of automatic (implantable) cardiac defibrillator: Secondary | ICD-10-CM

## 2016-09-26 DIAGNOSIS — I5022 Chronic systolic (congestive) heart failure: Secondary | ICD-10-CM

## 2016-09-26 NOTE — Progress Notes (Signed)
EPIC Encounter for ICM Monitoring  Patient Name: Carl [name] is a 66 y.o. male Date: 09/26/2016 Primary Care Physican: [name], MD Primary Cardiologist:McLean Electrophysiologist: Allred Dry Weight:unknown       Spoke with mother.  She reported patient is asymptomatic.  Thoracic impedance continues to be abnormal suggesting fluid accumulation.  Confirmed with mother that patient has been taking Furosemide 80 mg bid as ordered by Dr [name] on 1/26    Labs:  BMET scheduled for 09/29/2016 as ordered 09/15/2016 Creatinine 1.60, BUN 32, Potassium 3.5, Sodium 140, EGFR 44-51 08/04/2016 Creatinine 1.77, BUN 51, Potassium 4.5, Sodium 136, EGFR 39-45 06/19/2016 Creatinine 1.37, BUN 21, Potassium 4.1, Sodium 138, EGFR 53->60 06/12/2016 Creatinine 1.47, BUN 31, Potassium 3.6, Sodium 138, EGFR 49-56  06/11/2016 Creatinine 1.95, BUN 40, Potassium 3.4, Sodium 138, EGFR 35-40  06/10/2016 Creatinine 2.28, BUN 46, Potassium 3.8, Sodium 139, EGFR 29-33  03/06/2016 Creatinine 0.97, BUN 16, Potassium 3.6, Sodium 139, EGFR >60->60   Recommendations:  She reports patient follows low salt diet and limit fluid intake to 2 liters a day.    Advised if any recommendations are made today will call her back or Dr [name] may wait until he sees him in the office on 2/13 to provide recommendations.   Follow-up plan: ICM clinic phone appointment on 10/21/2016 since patient has a  HF clinic appointment 10/07/2016  Copy of ICM check sent to Dr [name] and [name] for review.   3 month ICM trend: 09/26/2016   1 Year ICM trend:     Rosalene Billings, RN 09/26/2016 7:54 AM

## 2016-09-26 NOTE — Progress Notes (Signed)
Keep 80 bid and let's see him in office.

## 2016-09-29 ENCOUNTER — Other Ambulatory Visit: Payer: Medicare Other

## 2016-09-30 ENCOUNTER — Other Ambulatory Visit: Payer: Medicare Other | Admitting: *Deleted

## 2016-09-30 DIAGNOSIS — I5023 Acute on chronic systolic (congestive) heart failure: Secondary | ICD-10-CM

## 2016-09-30 LAB — BASIC METABOLIC PANEL
BUN / CREAT RATIO: 21 (ref 10–24)
BUN: 38 mg/dL — ABNORMAL HIGH (ref 8–27)
CO2: 27 mmol/L (ref 18–29)
CREATININE: 1.84 mg/dL — AB (ref 0.76–1.27)
Calcium: 9 mg/dL (ref 8.6–10.2)
Chloride: 98 mmol/L (ref 96–106)
GFR calc Af Amer: 43 mL/min/{1.73_m2} — ABNORMAL LOW (ref >59)
GFR, EST NON AFRICAN AMERICAN: 38 mL/min/{1.73_m2} — AB (ref >59)
Glucose: 90 mg/dL (ref 65–99)
Potassium: 3.7 mmol/L (ref 3.5–5.2)
SODIUM: 142 mmol/L (ref 134–144)

## 2016-10-02 ENCOUNTER — Ambulatory Visit (INDEPENDENT_AMBULATORY_CARE_PROVIDER_SITE_OTHER): Payer: Medicare Other

## 2016-10-02 DIAGNOSIS — I5022 Chronic systolic (congestive) heart failure: Secondary | ICD-10-CM

## 2016-10-02 DIAGNOSIS — Z95810 Presence of automatic (implantable) cardiac defibrillator: Secondary | ICD-10-CM

## 2016-10-02 NOTE — Progress Notes (Signed)
EPIC Encounter for ICM Monitoring  Patient Name: Carl [name] is a 66 y.o. male Date: 10/02/2016 Primary Care Physican: [name], MD Primary Cardiologist:McLean Electrophysiologist: Allred Baseline Weight: 163 -165 lbs Current Weight: unknown      Spoke with mother. Heart Failure questions reviewed, pt asymptomatic   Thoracic impedance abnormal suggesting fluid accumulation.  Confirmed he continues to take Furosemide 80 mg bid.   Labs:   09/30/2016 Creatinine 1.84, BUN 38, Potassuim 3.7, Sodium 142, EGFR 38-43 09/15/2016 Creatinine 1.60, BUN 32, Potassium 3.5, Sodium 140, EGFR 44-51 08/04/2016 Creatinine 1.77, BUN 51, Potassium 4.5, Sodium 136, EGFR 39-45 06/19/2016 Creatinine 1.37, BUN 21, Potassium 4.1, Sodium 138, EGFR 53->60 06/12/2016 Creatinine 1.47, BUN 31, Potassium 3.6, Sodium 138, EGFR 49-56  06/11/2016 Creatinine 1.95, BUN 40, Potassium 3.4, Sodium 138, EGFR 35-40  06/10/2016 Creatinine 2.28, BUN 46, Potassium 3.8, Sodium 139, EGFR 29-33  03/06/2016 Creatinine 0.97, BUN 16, Potassium 3.6, Sodium 139, EGFR >60->60   Recommendations: None at this time. Dr [name] reviewed labs on 10/01/2016 and no changes until he sees patient at office appointment 10/07/2016.  Encouraged to call for fluid symptoms.  Follow-up plan: ICM clinic phone appointment on 10/21/2016.  HF clinic appointment 10/07/2016  Copy of ICM check sent to primary cardiologist and device physician.   3 month ICM trend: 10/01/2016   1 Year ICM trend:      Rosalene Billings, RN 10/02/2016 10:17 AM

## 2016-10-07 ENCOUNTER — Ambulatory Visit (HOSPITAL_BASED_OUTPATIENT_CLINIC_OR_DEPARTMENT_OTHER)
Admission: RE | Admit: 2016-10-07 | Discharge: 2016-10-07 | Disposition: A | Discharge status: Home / Self Care | Payer: Medicare Other | Source: Ambulatory Visit | Attending: Adult Health | Admitting: Adult Health

## 2016-10-07 ENCOUNTER — Ambulatory Visit (HOSPITAL_COMMUNITY): Payer: Medicare Other

## 2016-10-07 ENCOUNTER — Other Ambulatory Visit: Payer: Self-pay

## 2016-10-07 ENCOUNTER — Ambulatory Visit (HOSPITAL_COMMUNITY)
Admission: RE | Admit: 2016-10-07 | Discharge: 2016-10-07 | Disposition: A | Discharge status: Home / Self Care | Payer: Medicare Other | Source: Ambulatory Visit | Attending: Internal Medicine | Admitting: Internal Medicine

## 2016-10-07 VITALS — BP 84/54 | HR 68 | Wt 165.2 lb

## 2016-10-07 DIAGNOSIS — I501 Left ventricular failure: Secondary | ICD-10-CM | POA: Insufficient documentation

## 2016-10-07 DIAGNOSIS — R9439 Abnormal result of other cardiovascular function study: Secondary | ICD-10-CM | POA: Diagnosis not present

## 2016-10-07 DIAGNOSIS — I255 Ischemic cardiomyopathy: Secondary | ICD-10-CM | POA: Insufficient documentation

## 2016-10-07 DIAGNOSIS — Z951 Presence of aortocoronary bypass graft: Secondary | ICD-10-CM | POA: Diagnosis not present

## 2016-10-07 DIAGNOSIS — I519 Heart disease, unspecified: Secondary | ICD-10-CM | POA: Diagnosis not present

## 2016-10-07 DIAGNOSIS — N183 Chronic kidney disease, stage 3 (moderate): Secondary | ICD-10-CM | POA: Diagnosis not present

## 2016-10-07 DIAGNOSIS — E785 Hyperlipidemia, unspecified: Secondary | ICD-10-CM | POA: Insufficient documentation

## 2016-10-07 DIAGNOSIS — N182 Chronic kidney disease, stage 2 (mild): Secondary | ICD-10-CM | POA: Diagnosis not present

## 2016-10-07 DIAGNOSIS — I5022 Chronic systolic (congestive) heart failure: Secondary | ICD-10-CM | POA: Diagnosis not present

## 2016-10-07 DIAGNOSIS — I340 Nonrheumatic mitral (valve) insufficiency: Secondary | ICD-10-CM | POA: Diagnosis not present

## 2016-10-07 DIAGNOSIS — Z7982 Long term (current) use of aspirin: Secondary | ICD-10-CM | POA: Diagnosis not present

## 2016-10-07 DIAGNOSIS — F329 Major depressive disorder, single episode, unspecified: Secondary | ICD-10-CM | POA: Diagnosis not present

## 2016-10-07 DIAGNOSIS — I2510 Atherosclerotic heart disease of native coronary artery without angina pectoris: Secondary | ICD-10-CM | POA: Insufficient documentation

## 2016-10-07 DIAGNOSIS — R079 Chest pain, unspecified: Secondary | ICD-10-CM | POA: Diagnosis not present

## 2016-10-07 DIAGNOSIS — I5023 Acute on chronic systolic (congestive) heart failure: Secondary | ICD-10-CM | POA: Diagnosis not present

## 2016-10-07 DIAGNOSIS — Z95810 Presence of automatic (implantable) cardiac defibrillator: Secondary | ICD-10-CM | POA: Diagnosis not present

## 2016-10-07 DIAGNOSIS — Z87891 Personal history of nicotine dependence: Secondary | ICD-10-CM | POA: Diagnosis not present

## 2016-10-07 DIAGNOSIS — F70 Mild intellectual disabilities: Secondary | ICD-10-CM | POA: Insufficient documentation

## 2016-10-07 MED ORDER — LOSARTAN POTASSIUM 25 MG PO TABS: 12.5000 mg | ORAL_TABLET | Freq: Every day | ORAL | 3 refills | Status: DC

## 2016-10-07 MED ORDER — FUROSEMIDE 40 MG PO TABS: ORAL_TABLET | ORAL | 3 refills | Status: DC

## 2016-10-07 NOTE — Patient Instructions (Signed)
Decrease Losartan to 12.5 mg (1/2 tab) every evening  Decrease Furosemide (Lasix) to 80 mg (2 tabs) in AM and 40 mg (1 tab) in PM  Labs in 2 weeks  MAKE SURE HE IS NOT TAKING LISINOPRIL  STOP TAKING MELOXICAM, CAN USE TYLENOL AS NEEDED FOR PAIN  Your physician has requested that you have a lexiscan myoview. For further information please visit [email or web address]. Please follow instruction sheet, as given.  Your physician recommends that you schedule a follow-up appointment in: 1 month, PLEASE BRING YOUR MOTHER TO THIS APPOINTMENT

## 2016-10-07 NOTE — Progress Notes (Signed)
Patient ID: Carl [name], male   DOB: April 22, 1951, 66 y.o.   MRN: [id number] PCP: Dr. Ginette Pitman Cardiology: Dr. [name]  66 yo with history of smoking and mild mental retardation was admitted to [facility] in 12/15 with dyspnea.  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  LHC showed 3 vessel disease with EF 15%.  Echo showed EF 15-20%.  Patient had CABG x 5.  It was difficult to wean him off pressors post-op.  He ended up having to start midodrine.  This was weaned off.  Admitted 10/17 with volume overload and AKI. Diuresed with IV lasix and transitiioned to 40 mg lasix daily. Spiro, dig, lisinopril stopped due to elevated creatinine 2.28. Discharge weight 163 pounds.    Echo in 2/18 showed EF 30-35%, mild LV dilation, rWMAs, moderate MR, mild to moderately decreased RV systolic function.   Optivol has been reading consistently high and Lasix has been increased.  Weight is down 2 lbs since last appointment.  BP is soft though he denies lightheadedness.  He has, of note, had central chest pain continuously x 1 month.   No orthopnea/PND.  No dyspnea with exertion.  Somewhat difficult historian, I cannot tell if he is taking both lisinopril and losartan.   Optivol: Fluid index > threshold, impedance has decreased. 3 hrs/day activity.   Labs (12/15): K 4.1, creatinine 0.81, hgb 9.1 Labs (09/12/2014): K 3.7 Creatinine 0.96, digoxin 0.7 Labs (3/16): digoxin 0.8, LDL 62, HDL 40 Labs (4/16): K 4.3, creatinine 1.1 Labs (5/16) K 4.6, creatinine 1.24, digoxin 1.0 Labs (05/2016): K 3.6 Creatinine 1.47  => 1.37 Labs (11/17): LDL 61, HDL 41, K 4.2, creaitnine 1.1 Labs (2/18): K 3.7, creatinine 1.84  ECG: NSR, PVCs, old ASMI (no change from prior).   PMH: 1. Smoker 2. Mild mental retardation 3. CAD: LHC (12/15) with 3 vessel disease.  CABG x 5 in 12/15 with LIMA-LAD, SVG-D1, sequential SVG-OM2/OM3, SVG-PDA.   4. Ischemic cardiomyopathy: Echo (12/15) with EF 15-20%, wall motion abnormalities, mildly decreased RV systolic  function, mild MR.  Echo (3/16) with EF 30-35%, severe LV dilation, moderate MR, PA systolic pressure 42 mmHg. Echo (8/16) with EF 25-30%, severely dilated LV, diffuse hypokinesis with inferior akinesis, restrictive diastolic function, RV mildly dilated with mildly decreased systolic function, moderate MR.  - Echo (10/17): EF 20-25%, moderate MR.  - Medtronic ICD.  - ACEI cough.  - Echo (2/18): EF 30-35%, mild LV dilation, regional WMAs, moderate diastolic dysfunction, normal RV size with mild to moderately decreased systolic function, moderate MR (likely infarct-related).  5. Depression 6. Vitiligo 7. Mitral regurgitation: Moderate on 2/18, likely infarct-related.  8. CKD stage III.   SH: Lives with mother, quit smoking in 12/15.  No ETOH.   FH: Brother and father with MIs.    ROS: All systems reviewed and negative except as per HPI.   Current Outpatient Prescriptions  Medication Sig Dispense Refill  . aspirin 81 MG tablet Take 81 mg by mouth 2 (two) times daily.    Marland Kitchen atorvastatin (LIPITOR) 40 MG tablet Take 1 tablet (40 mg total) by mouth daily at 6 PM. 30 tablet 1  . carvedilol (COREG) 3.125 MG tablet Take 3.125 mg by mouth 2 (two) times daily with a meal.    . citalopram (CELEXA) 20 MG tablet Take 1 tablet by mouth daily.  5  . cyanocobalamin 500 MCG tablet Take 500 mcg by mouth daily.    . ferrous sulfate 325 (65 FE) MG tablet Take 325 mg  by mouth daily with breakfast.    . furosemide (LASIX) 40 MG tablet Take 2 tablets (80 mg total) in AM and 1 tab (40 mg) in PM 90 tablet 3  . lidocaine (XYLOCAINE) 2 % jelly Apply 1 application topically daily as needed for irritation.  3  . losartan (COZAAR) 25 MG tablet Take 0.5 tablets (12.5 mg total) by mouth at bedtime. 15 tablet 3  . omeprazole (PRILOSEC) 20 MG capsule Take 20 mg by mouth 2 (two) times daily.    Marland Kitchen spironolactone (ALDACTONE) 25 MG tablet Take 1 tablet (25 mg total) by mouth daily. Take in Evening 30 tablet 6  . tiZANidine  (ZANAFLEX) 4 MG tablet Take 4 mg by mouth every 6 (six) hours as needed for muscle spasms.    . traMADol (ULTRAM) 50 MG tablet Take 50 mg by mouth every 6 (six) hours as needed.     No current facility-administered medications for this encounter.    BP (!) 84/54 (BP Location: Left Arm, Patient Position: Sitting, Cuff Size: Normal)   Pulse 68   Wt 165 lb 4 oz (75 kg)   SpO2 97%   BMI 22.41 kg/m   Filed Weights   10/07/16 1405  Weight: 165 lb 4 oz (75 kg)    General: NAD.  Neck: JVP 7 cm, no thyromegaly or thyroid nodule.  Lungs: Clear to auscultation bilaterally with normal respiratory effort. CV: Nondisplaced PMI.  Heart regular S1/S2, no S3/S4, 1/6 HSM at apex.  No edema.  No carotid bruit.  Normal pedal pulses.  Abdomen: Soft, nontender, no hepatosplenomegaly, no distention.  Skin: Vitiligo Neurologic: Alert and oriented x 3.  Psych: Flat affect. Extremities: No clubbing or cyanosis.  HEENT: Normal.   Assessment/Plan: 1. CAD: status post CABG.  He has had atypical, continuous pain x 1 month without any changes.  - Continue ASA 81 and statin.  - I will arrange for Lexiscan Cardiolite to look for ischemia.    2. Chronic systolic CHF: Ischemic CMP.  Echo 2/18 with EF 30-35%, mild to moderate RV dysfunction.  Has Medtronic ICD. NYHA class II symptoms.  He does not look volume overloaded by exam and creatinine is up though Optivol suggests ongoing volume overload.  I am not sure that Optivol is accurate for this patient.   Soft BP.  - Continue Coreg 3.125 mg bid. Will not increase with soft BP.  - I will decrease losartan to 12.5 mg daily and have him take it in the evening.   - He will check his meds at home and if he is still taking lisinopril, he will stop it.  - Continue current spironolactone.  - Decrease Lasix to 80 qam/40 qpm.  BMET in 2 wks.  - Given my concerns about Optivol accuracy, I think he would be a good Cardiomems candidate. I will have him return in 1 month with his  mother to discuss this.    3. Smoking: He has quit smoking since CABG. 4. Depression: On Celexa.  5. Hyperlipidemia: Good lipids 11/17, continue statin.     6. Mitral regurgitation: Suspect infarct-related MR.  MR moderate on last echo.  7. CKD: Stage III.  Creatinine has trended up with increased Lasix.   - As I do not think that he is volume overloaded, I will decrease Lasix to 80 qam/40 qpm.   - He needs to stop meloxicam.  He can use Tylenol for pain.   Followup in 1 month, he should bring his mother with  him to discuss Cardiomems.   [name] 10/07/2016

## 2016-10-07 NOTE — Progress Notes (Signed)
  Echocardiogram 2D Echocardiogram has been performed.  Diamond Nickel 10/07/2016, 12:32 PM

## 2016-10-13 ENCOUNTER — Telehealth (HOSPITAL_COMMUNITY): Payer: Self-pay | Admitting: *Deleted

## 2016-10-13 ENCOUNTER — Telehealth (HOSPITAL_COMMUNITY): Payer: Self-pay | Admitting: Radiology

## 2016-10-13 NOTE — Telephone Encounter (Signed)
Left message with mother and brother for patient to call us back in reference to upcoming appointment scheduled for 10/15/16. Patient is mildly mental retarded but no DPR on file. Phone number given for a call back so details instructions can be given. Maridel Pixler, [place]e [place] '

## 2016-10-13 NOTE — Telephone Encounter (Signed)
Patient gave permission to give instructions to [facility]) and Geraldine(mother). Both given detailed instructions per Myocardial Perfusion Study Information Sheet for the test on 10/15/16 at 1000. Patient notified to arrive 15 minutes early and that it is imperative to arrive on time for appointment to keep from having the test rescheduled.  If you need to cancel or reschedule your appointment, please call the office within 24 hours of your appointment. Failure to do so may result in a cancellation of your appointment, and a $50 no show fee. Patient verbalized understanding.[place], [place]

## 2016-10-15 ENCOUNTER — Ambulatory Visit (HOSPITAL_COMMUNITY): Payer: Medicare Other | Attending: Internal Medicine

## 2016-10-15 DIAGNOSIS — I252 Old myocardial infarction: Secondary | ICD-10-CM | POA: Diagnosis not present

## 2016-10-15 DIAGNOSIS — R9439 Abnormal result of other cardiovascular function study: Secondary | ICD-10-CM | POA: Diagnosis not present

## 2016-10-15 DIAGNOSIS — R079 Chest pain, unspecified: Secondary | ICD-10-CM | POA: Diagnosis not present

## 2016-10-15 DIAGNOSIS — I501 Left ventricular failure: Secondary | ICD-10-CM | POA: Diagnosis not present

## 2016-10-15 LAB — MYOCARDIAL PERFUSION IMAGING
CHL CUP NUCLEAR SRS: 17
CHL CUP NUCLEAR SSS: 19
CHL CUP RESTING HR STRESS: 66 {beats}/min
CSEPPHR: 93 {beats}/min
LV sys vol: 201 mL
LVDIAVOL: 284 mL (ref 62–150)
RATE: 0.45
SDS: 2
TID: 1.13

## 2016-10-15 MED ORDER — TECHNETIUM TC 99M TETROFOSMIN IV KIT
10.3000 | PACK | Freq: Once | INTRAVENOUS | Status: AC | PRN
  Administered 2016-10-15: 10.3 via INTRAVENOUS
  Filled 2016-10-15: qty 11

## 2016-10-15 MED ORDER — REGADENOSON 0.4 MG/5ML IV SOLN
0.4000 mg | Freq: Once | INTRAVENOUS | Status: AC
  Administered 2016-10-15: 0.4 mg via INTRAVENOUS

## 2016-10-15 MED ORDER — TECHNETIUM TC 99M TETROFOSMIN IV KIT
32.3000 | PACK | Freq: Once | INTRAVENOUS | Status: AC | PRN
  Administered 2016-10-15: 32.3 via INTRAVENOUS
  Filled 2016-10-15: qty 33

## 2016-10-21 ENCOUNTER — Ambulatory Visit (INDEPENDENT_AMBULATORY_CARE_PROVIDER_SITE_OTHER): Payer: Medicare Other

## 2016-10-21 ENCOUNTER — Ambulatory Visit (HOSPITAL_COMMUNITY)
Admission: RE | Admit: 2016-10-21 | Discharge: 2016-10-21 | Disposition: A | Discharge status: Home / Self Care | Payer: Medicare Other | Source: Ambulatory Visit | Attending: Cardiology | Admitting: Cardiology

## 2016-10-21 ENCOUNTER — Encounter (HOSPITAL_COMMUNITY): Payer: Self-pay | Admitting: *Deleted

## 2016-10-21 DIAGNOSIS — I5023 Acute on chronic systolic (congestive) heart failure: Secondary | ICD-10-CM | POA: Diagnosis present

## 2016-10-21 DIAGNOSIS — Z95810 Presence of automatic (implantable) cardiac defibrillator: Secondary | ICD-10-CM | POA: Diagnosis not present

## 2016-10-21 DIAGNOSIS — I519 Heart disease, unspecified: Secondary | ICD-10-CM

## 2016-10-21 LAB — BASIC METABOLIC PANEL
ANION GAP: 6 (ref 5–15)
BUN: 36 mg/dL — ABNORMAL HIGH (ref 6–20)
CHLORIDE: 106 mmol/L (ref 101–111)
CO2: 24 mmol/L (ref 22–32)
Calcium: 9.2 mg/dL (ref 8.9–10.3)
Creatinine, Ser: 1.35 mg/dL — ABNORMAL HIGH (ref 0.61–1.24)
GFR calc non Af Amer: 54 mL/min — ABNORMAL LOW (ref >60)
Glucose, Bld: 87 mg/dL (ref 65–99)
POTASSIUM: 5.3 mmol/L — AB (ref 3.5–5.1)
SODIUM: 136 mmol/L (ref 135–145)

## 2016-10-21 LAB — BRAIN NATRIURETIC PEPTIDE: B Natriuretic Peptide: 923.6 pg/mL — ABNORMAL HIGH (ref 0.0–100.0)

## 2016-10-21 NOTE — Progress Notes (Signed)
EPIC Encounter for ICM Monitoring  Patient Name: Carl [name] is a 66 y.o. male Date: 10/21/2016 Primary Care Physican: [name], MD Primary Cardiologist:McLean Electrophysiologist: Allred Baseline Weight: 163 -165 lbs Current Weight: unknown      Attempted call to mother/patient and unable to reach.  Left message to return call.  Transmission reviewed.    Thoracic impedance had reached normal on 10/07/2016 until 10/20/2016 and fluid index < threshold but now impedance starting decreasing again. abnormal suggesting fluid accumulation.  Prescribed dosage: Lasix decreased on 2/13 to 80 qam/40 q pm at HF clinic appt  Labs:  09/30/2016 Creatinine 1.84, BUN 38, Potassuim 3.7, Sodium 142, EGFR 38-43 09/15/2016 Creatinine 1.60, BUN 32, Potassium 3.5, Sodium 140, EGFR 44-51 08/04/2016 Creatinine 1.77, BUN 51, Potassium 4.5, Sodium 136, EGFR 39-45 06/19/2016 Creatinine 1.37, BUN 21, Potassium 4.1, Sodium 138, EGFR 53->60 06/12/2016 Creatinine 1.47, BUN 31, Potassium 3.6, Sodium 138, EGFR 49-56  06/11/2016 Creatinine 1.95, BUN 40, Potassium 3.4, Sodium 138, EGFR 35-40  06/10/2016 Creatinine 2.28, BUN 46, Potassium 3.8, Sodium 139, EGFR 29-33  03/06/2016 Creatinine 0.97, BUN 16, Potassium 3.6, Sodium 139, EGFR >60->60   Recommendations: NONE - Unable to reach mother/patient   Follow-up plan: ICM clinic phone appointment on 10/30/2016.  HF clinic appointment on 11/11/2016  Copy of ICM check sent to primary cardiologist and device physician.   3 month ICM trend: 10/21/2016   1 Year ICM trend:      Rosalene Billings, RN 10/21/2016 7:50 AM

## 2016-10-24 ENCOUNTER — Other Ambulatory Visit: Payer: Self-pay | Admitting: Internal Medicine

## 2016-10-30 ENCOUNTER — Ambulatory Visit (INDEPENDENT_AMBULATORY_CARE_PROVIDER_SITE_OTHER): Payer: Medicare Other

## 2016-10-30 DIAGNOSIS — I5022 Chronic systolic (congestive) heart failure: Secondary | ICD-10-CM

## 2016-10-30 DIAGNOSIS — Z95810 Presence of automatic (implantable) cardiac defibrillator: Secondary | ICD-10-CM

## 2016-10-30 NOTE — Progress Notes (Signed)
EPIC Encounter for ICM Monitoring  Patient Name: Carl [name] is a 66 y.o. male Date: 10/30/2016 Primary Care Physican: [name], MD Primary Cardiologist:McLean Electrophysiologist: Allred Dry Weight:unknown      Spoke with mother. Heart Failure questions reviewed, mother reported patients face is swollen today but she thinks it might be related to how he slept but she isn't sure.  He has had a little SOB.  Mother has difficulty knowing what is going on with patient as he does not always tell her.   She reported he drinks a lot of fluids but she is not sure how much.  Explained he should limit all fluid intake to 64 oz which would equal a 2 liter bottle size.  Baseline Weight:163 -165 lbs   Thoracic impedance abnormal suggesting fluid accumulation since ~10/18/2016.    Prescribed and confirmed dosage: Lasix 40 mg 2 tablets (80 mg total) every am and 1 tablet (40 mg total) every pm   Labs:  10/21/2016 Creatinine 1.35, BUN 36, Potassium 5.3, Sodium 136, EGFR 54->60 09/30/2016 Creatinine 1.84, BUN 38, Potassuim 3.7, Sodium 142, EGFR 38-43 09/15/2016 Creatinine 1.60, BUN 32, Potassium 3.5, Sodium 140, EGFR 44-51 08/04/2016 Creatinine 1.77, BUN 51, Potassium 4.5, Sodium 136, EGFR 39-45 06/19/2016 Creatinine 1.37, BUN 21, Potassium 4.1, Sodium 138, EGFR 53->60 06/12/2016 Creatinine 1.47, BUN 31, Potassium 3.6, Sodium 138, EGFR 49-56  06/11/2016 Creatinine 1.95, BUN 40, Potassium 3.4, Sodium 138, EGFR 35-40  06/10/2016 Creatinine 2.28, BUN 46, Potassium 3.8, Sodium 139, EGFR 29-33  03/06/2016 Creatinine 0.97, BUN 16, Potassium 3.6, Sodium 139, EGFR >60->60   Recommendations:  Copy of ICM check sent to Dr [name] and [name] for review and recommendations.    Follow-up plan: ICM clinic phone appointment on 11/06/2016.  HF clinic appointment 11/11/2016  3 month ICM trend: 10/30/2016   1 Year ICM trend:      Rosalene Billings, RN 10/30/2016 8:49 AM

## 2016-10-31 NOTE — Progress Notes (Signed)
Spoke with mother.  Advised of Dr Claris Gladden recommendation to Increase Lasix to 80 mg bid x 4 days then back to 80 qam/40 qpm.  She wrote down the instructions and repeated them several times.  Requested to send a remote transmission on 11/06/2016 to recheck fluid levels.

## 2016-10-31 NOTE — Progress Notes (Signed)
Increase Lasix to 80 mg bid x 4 days then back to 80 qam/40 qpm.

## 2016-11-06 ENCOUNTER — Ambulatory Visit (INDEPENDENT_AMBULATORY_CARE_PROVIDER_SITE_OTHER): Payer: Medicare Other

## 2016-11-06 DIAGNOSIS — I5022 Chronic systolic (congestive) heart failure: Secondary | ICD-10-CM | POA: Diagnosis not present

## 2016-11-06 DIAGNOSIS — Z95810 Presence of automatic (implantable) cardiac defibrillator: Secondary | ICD-10-CM | POA: Diagnosis not present

## 2016-11-06 NOTE — Progress Notes (Signed)
EPIC Encounter for ICM Monitoring  Patient Name: Carl [name] is a 66 y.o. male Date: 11/06/2016 Primary Care Physican: [name], MD Primary Cardiologist:McLean Electrophysiologist: Allred Dry Weight:unknown      Spoke with mother.  Heart Failure questions reviewed, pt asymptomatic.   Thoracic impedance returned to baseline normal 11/02/2016 after increasing Furosemide to 80 mg bid x 4 days.  Prescribed and confirmed dosage: Lasix 40 mg 2 tablets (80 mg total) every am and 1 tablet (40 mg total) every pm   Labs:  10/21/2016 Creatinine 1.35, BUN 36, Potassium 5.3, Sodium 136, EGFR 54->60 09/30/2016 Creatinine 1.84, BUN 38, Potassuim 3.7, Sodium 142, EGFR 38-43 09/15/2016 Creatinine 1.60, BUN 32, Potassium 3.5, Sodium 140, EGFR 44-51 08/04/2016 Creatinine 1.77, BUN 51, Potassium 4.5, Sodium 136, EGFR 39-45 06/19/2016 Creatinine 1.37, BUN 21, Potassium 4.1, Sodium 138, EGFR 53->60 06/12/2016 Creatinine 1.47, BUN 31, Potassium 3.6, Sodium 138, EGFR 49-56  06/11/2016 Creatinine 1.95, BUN 40, Potassium 3.4, Sodium 138, EGFR 35-40  06/10/2016 Creatinine 2.28, BUN 46, Potassium 3.8, Sodium 139, EGFR 29-33  03/06/2016 Creatinine 0.97, BUN 16, Potassium 3.6, Sodium 139, EGFR >60->60  Recommendations:  No changes. Mother reported patient eats a lot of crushed ice which may be contributing to fluid retention.  She reported she informed patient he is supposed to have toatl of 64 oz fluid daily.  Encouraged to call for fluid symptoms.  Follow-up plan: ICM clinic phone appointment on 11/27/2016.  Office HF clinic appointment on 11/11/2016  Copy of ICM check sent to primary cardiologist and device physician.   3 month ICM trend: 11/06/2016  1 Year ICM trend:      Rosalene Billings, RN 11/06/2016 3:49 PM

## 2016-11-11 ENCOUNTER — Ambulatory Visit (HOSPITAL_COMMUNITY)
Admission: RE | Admit: 2016-11-11 | Discharge: 2016-11-11 | Disposition: A | Discharge status: Home / Self Care | Payer: Medicare Other | Source: Ambulatory Visit | Attending: Cardiology | Admitting: Cardiology

## 2016-11-11 ENCOUNTER — Encounter (HOSPITAL_COMMUNITY): Payer: Self-pay

## 2016-11-11 VITALS — BP 103/62 | HR 64 | Wt 168.0 lb

## 2016-11-11 DIAGNOSIS — E785 Hyperlipidemia, unspecified: Secondary | ICD-10-CM | POA: Insufficient documentation

## 2016-11-11 DIAGNOSIS — Z79899 Other long term (current) drug therapy: Secondary | ICD-10-CM | POA: Diagnosis not present

## 2016-11-11 DIAGNOSIS — I340 Nonrheumatic mitral (valve) insufficiency: Secondary | ICD-10-CM | POA: Insufficient documentation

## 2016-11-11 DIAGNOSIS — N182 Chronic kidney disease, stage 2 (mild): Secondary | ICD-10-CM | POA: Diagnosis not present

## 2016-11-11 DIAGNOSIS — Z7982 Long term (current) use of aspirin: Secondary | ICD-10-CM | POA: Diagnosis not present

## 2016-11-11 DIAGNOSIS — Z95810 Presence of automatic (implantable) cardiac defibrillator: Secondary | ICD-10-CM | POA: Diagnosis not present

## 2016-11-11 DIAGNOSIS — Z951 Presence of aortocoronary bypass graft: Secondary | ICD-10-CM | POA: Diagnosis not present

## 2016-11-11 DIAGNOSIS — Z87891 Personal history of nicotine dependence: Secondary | ICD-10-CM | POA: Insufficient documentation

## 2016-11-11 DIAGNOSIS — N183 Chronic kidney disease, stage 3 (moderate): Secondary | ICD-10-CM | POA: Diagnosis not present

## 2016-11-11 DIAGNOSIS — I5022 Chronic systolic (congestive) heart failure: Secondary | ICD-10-CM | POA: Diagnosis not present

## 2016-11-11 DIAGNOSIS — I519 Heart disease, unspecified: Secondary | ICD-10-CM

## 2016-11-11 DIAGNOSIS — I255 Ischemic cardiomyopathy: Secondary | ICD-10-CM | POA: Insufficient documentation

## 2016-11-11 DIAGNOSIS — I2510 Atherosclerotic heart disease of native coronary artery without angina pectoris: Secondary | ICD-10-CM | POA: Diagnosis not present

## 2016-11-11 LAB — BASIC METABOLIC PANEL
ANION GAP: 8 (ref 5–15)
BUN: 32 mg/dL — ABNORMAL HIGH (ref 6–20)
CHLORIDE: 102 mmol/L (ref 101–111)
CO2: 28 mmol/L (ref 22–32)
Calcium: 9.8 mg/dL (ref 8.9–10.3)
Creatinine, Ser: 1.33 mg/dL — ABNORMAL HIGH (ref 0.61–1.24)
GFR calc non Af Amer: 55 mL/min — ABNORMAL LOW (ref >60)
GLUCOSE: 78 mg/dL (ref 65–99)
Potassium: 4.8 mmol/L (ref 3.5–5.1)
Sodium: 138 mmol/L (ref 135–145)

## 2016-11-11 LAB — BRAIN NATRIURETIC PEPTIDE: B Natriuretic Peptide: 551.5 pg/mL — ABNORMAL HIGH (ref 0.0–100.0)

## 2016-11-11 NOTE — Patient Instructions (Signed)
Routine lab work today. Will notify you of abnormal results  Follow up with Dr.McLean in 2 months 

## 2016-11-12 ENCOUNTER — Other Ambulatory Visit (HOSPITAL_COMMUNITY): Payer: Self-pay | Admitting: Cardiology

## 2016-11-12 DIAGNOSIS — I5023 Acute on chronic systolic (congestive) heart failure: Secondary | ICD-10-CM

## 2016-11-12 MED ORDER — FUROSEMIDE 40 MG PO TABS: ORAL_TABLET | ORAL | 3 refills | Status: DC

## 2016-11-12 NOTE — Progress Notes (Signed)
Patient ID: Carl [name], male   DOB: May 28, 1951, 66 y.o.   MRN: [id number] PCP: Dr. Ginette Pitman Cardiology: Dr. [name]  66 y.o. with history of smoking and mild mental retardation was admitted to [facility] in 12/15 with dyspnea.  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  LHC showed 3 vessel disease with EF 15%.  Echo showed EF 15-20%.  Patient had CABG x 5.  It was difficult to wean him off pressors post-op.  He ended up having to start midodrine.  This was weaned off.  Admitted 10/17 with volume overload and AKI. Diuresed with IV lasix and transitiioned to 40 mg lasix daily. Spiro, dig, lisinopril stopped due to elevated creatinine 2.28. Discharge weight 163 pounds.    Echo in 2/18 showed EF 30-35%, mild LV dilation, rWMAs, moderate MR, mild to moderately decreased RV systolic function. Cardiolite in 2/18 showed large inferoseptal/inferior/inferolateral infarction with no ischemia.   Patient has had constant, fairly mild central chest pain for 2-3 months.  His chest wall is tender to palpation.  No dyspnea walking on flat ground, ok walking up a flight of steps.  Dyspnea with moderate to heavy activity.  No lightheadedness.  Weight up 3 lbs.    Optivol: Fluid index < threshold, impedance stable.   Labs (12/15): K 4.1, creatinine 0.81, hgb 9.1 Labs (09/12/2014): K 3.7 Creatinine 0.96, digoxin 0.7 Labs (3/16): digoxin 0.8, LDL 62, HDL 40 Labs (4/16): K 4.3, creatinine 1.1 Labs (5/16) K 4.6, creatinine 1.24, digoxin 1.0 Labs (05/2016): K 3.6 Creatinine 1.47  => 1.37 Labs (11/17): LDL 61, HDL 41, K 4.2, creaitnine 1.1 Labs (2/18): K 3.7 => 5.3, creatinine 1.84 => 1.35, BNP 924  PMH: 1. Smoker 2. Mild mental retardation 3. CAD: LHC (12/15) with 3 vessel disease.  CABG x 5 in 12/15 with LIMA-LAD, SVG-D1, sequential SVG-OM2/OM3, SVG-PDA.   - Cardiolite (2/18):  Large inferoseptal/inferior/inferolateral infarction with no ischemia, EF 29%.  4. Ischemic cardiomyopathy: Echo (12/15) with EF 15-20%, wall motion  abnormalities, mildly decreased RV systolic function, mild MR.  Echo (3/16) with EF 30-35%, severe LV dilation, moderate MR, PA systolic pressure 42 mmHg. Echo (8/16) with EF 25-30%, severely dilated LV, diffuse hypokinesis with inferior akinesis, restrictive diastolic function, RV mildly dilated with mildly decreased systolic function, moderate MR.  - Echo (10/17): EF 20-25%, moderate MR.  - Medtronic ICD.  - ACEI cough.  - Echo (2/18): EF 30-35%, mild LV dilation, regional WMAs, moderate diastolic dysfunction, normal RV size with mild to moderately decreased systolic function, moderate MR (likely infarct-related).  5. Depression 6. Vitiligo 7. Mitral regurgitation: Moderate on 2/18, likely infarct-related.  8. CKD stage III.   SH: Lives with mother, quit smoking in 12/15.  No ETOH.   FH: Brother and father with MIs.    ROS: All systems reviewed and negative except as per HPI.   Current Outpatient Prescriptions  Medication Sig Dispense Refill  . aspirin 81 MG tablet Take 81 mg by mouth 2 (two) times daily.    Marland Kitchen atorvastatin (LIPITOR) 40 MG tablet Take 1 tablet (40 mg total) by mouth daily at 6 PM. 30 tablet 1  . carvedilol (COREG) 3.125 MG tablet Take 3.125 mg by mouth 2 (two) times daily with a meal.    . citalopram (CELEXA) 20 MG tablet Take 1 tablet by mouth daily.  5  . cyanocobalamin 500 MCG tablet Take 500 mcg by mouth daily.    . ferrous sulfate 325 (65 FE) MG tablet Take 325 mg by  mouth daily with breakfast.    . lidocaine (XYLOCAINE) 2 % jelly Apply 1 application topically daily as needed for irritation.  3  . losartan (COZAAR) 25 MG tablet Take 0.5 tablets (12.5 mg total) by mouth at bedtime. 15 tablet 3  . omeprazole (PRILOSEC) 20 MG capsule Take 20 mg by mouth 2 (two) times daily.    Marland Kitchen spironolactone (ALDACTONE) 25 MG tablet Take 1 tablet (25 mg total) by mouth daily. Take in Evening 30 tablet 6  . tiZANidine (ZANAFLEX) 4 MG tablet Take 4 mg by mouth every 6 (six) hours as  needed for muscle spasms.    . traMADol (ULTRAM) 50 MG tablet Take 50 mg by mouth every 6 (six) hours as needed.    . furosemide (LASIX) 40 MG tablet Take 2 tablets (80 mg total) in AM and 1 tab (40 mg) in PM 270 tablet 3   No current facility-administered medications for this encounter.    BP 103/62   Pulse 64   Wt 168 lb (76.2 kg)   SpO2 99%   BMI 22.78 kg/m   Filed Weights   11/11/16 1208  Weight: 168 lb (76.2 kg)    General: NAD.  Neck: No JVD, no thyromegaly or thyroid nodule.  Lungs: CTAB CV: Nondisplaced PMI.  Heart regular S1/S2, no S3/S4, 1/6 HSM at apex.  No edema.  No carotid bruit.  Normal pedal pulses.  Abdomen: Soft, nontender, no hepatosplenomegaly, no distention.  Skin: Vitiligo Neurologic: Alert and oriented x 3.  Psych: Flat affect. Extremities: No clubbing or cyanosis.  HEENT: Normal.  MSK: Central chest wall tenderness to palpation.   Assessment/Plan: 1. CAD: status post CABG.  He has had atypical, continuous pain x 2-3 months without any changes.  Central chest wall is mildly tender.  Cardiolite in 2/18 showed infarction, no ischemia.  I suspect that his pain is non-cardiac.  - Continue ASA 81 and statin.  2. Chronic systolic CHF: Ischemic CMP.  Echo 2/18 with EF 30-35%, mild to moderate RV dysfunction.  Has Medtronic ICD. NYHA class III symptoms.  He does not look volume overloaded by exam, BP soft.   - Continue Coreg 3.125 mg bid. Will not increase with soft BP.  - Continue losartan 12.5 mg qhs.  Continue spironolactone but will need to repeat BMET today given mildly elevated K recently.   - Continue Lasix 80 qam/40 qpm.   - I have been concerned that Optivol is not accurate for this patient. I think he would be a good Cardiomems candidate. I discussed this with the patient and his mother today.  They are interested in going forward with this.  I will work on seeing if we can arrange implantation.  3. Smoking: He has quit smoking since CABG. 4. Depression:  On Celexa.  5. Hyperlipidemia: Good lipids 11/17, continue statin.     6. Mitral regurgitation: Suspect infarct-related MR.  MR moderate on last echo.  7. CKD: Stage III. BMET today.   Followup in 2 months.   [name] 11/12/2016

## 2016-11-14 ENCOUNTER — Other Ambulatory Visit: Payer: Self-pay | Admitting: Internal Medicine

## 2016-11-27 ENCOUNTER — Ambulatory Visit (INDEPENDENT_AMBULATORY_CARE_PROVIDER_SITE_OTHER): Payer: Medicare Other

## 2016-11-27 DIAGNOSIS — I5022 Chronic systolic (congestive) heart failure: Secondary | ICD-10-CM

## 2016-11-27 DIAGNOSIS — Z95810 Presence of automatic (implantable) cardiac defibrillator: Secondary | ICD-10-CM | POA: Diagnosis not present

## 2016-11-27 NOTE — Progress Notes (Signed)
EPIC Encounter for ICM Monitoring  Patient Name: Carl [name] is a 65 y.o. male Date: 11/27/2016 Primary Care Physican: HANDE,VISHWANATH, MD Primary Cardiologist:McLean Electrophysiologist: Allred DryWeight:unknown         Heart Failure questions reviewed, pt asymptomatic.    Thoracic impedance abnormal suggesting fluid accumulation since 11/21/2016.  Prescribed and confirmed dosage: Furosemide 40 mg 2 tablets (80 mg total) in AM and 1 tab (40 mg) in PM  Labs:  10/21/2016 Creatinine 1.35, BUN 36, Potassium 5.3, Sodium 136, EGFR 54->60 09/30/2016 Creatinine 1.84, BUN 38, Potassuim 3.7, Sodium 142, EGFR 38-43 09/15/2016 Creatinine 1.60, BUN 32, Potassium 3.5, Sodium 140, EGFR 44-51 08/04/2016 Creatinine 1.77, BUN 51, Potassium 4.5, Sodium 136, EGFR 39-45 06/19/2016 Creatinine 1.37, BUN 21, Potassium 4.1, Sodium 138, EGFR 53->60 06/12/2016 Creatinine 1.47, BUN 31, Potassium 3.6, Sodium 138, EGFR 49-56  06/11/2016 Creatinine 1.95, BUN 40, Potassium 3.4, Sodium 138, EGFR 35-40  06/10/2016 Creatinine 2.28, BUN 46, Potassium 3.8, Sodium 139, EGFR 29-33  03/06/2016 Creatinine 0.97, BUN 16, Potassium 3.6, Sodium 139, EGFR >60->60  Recommendations: No changes. Dr McLean's note 11/11/2016 reported patient maybe a good Cardiomems candidate and Optivol may not be accurate for patient.    Follow-up plan: ICM clinic phone appointment on 12/15/2016.  Office appointment scheduled on 01/12/2017 with Dr McLean, HF clinic.  Copy of ICM check sent to Dr McLean and Dr Allred for review and if any recommendations will call him back.   3 month ICM trend: 11/27/2016   1 Year ICM trend:       S , RN 11/27/2016 9:23 AM    

## 2016-11-27 NOTE — Progress Notes (Signed)
Take Lasix 80 mg bid x 2 days then back to 80 qam/40 qpm.

## 2016-11-28 NOTE — Progress Notes (Signed)
Call to patient's mother.  Advised Dr [name] recommended patient take Lasix 80 mg bid x 2 days then back to 80 qam/40 qpm.   Will recheck fluid levels on 12/01/2016.  She verbalized understanding and repeated back instructions.

## 2016-12-01 ENCOUNTER — Telehealth: Payer: Self-pay

## 2016-12-01 ENCOUNTER — Ambulatory Visit (INDEPENDENT_AMBULATORY_CARE_PROVIDER_SITE_OTHER): Payer: Self-pay

## 2016-12-01 DIAGNOSIS — I5022 Chronic systolic (congestive) heart failure: Secondary | ICD-10-CM

## 2016-12-01 DIAGNOSIS — Z95810 Presence of automatic (implantable) cardiac defibrillator: Secondary | ICD-10-CM

## 2016-12-01 NOTE — Telephone Encounter (Signed)
Call to patient and mother.  Advise to send remote transmission to recheck fluid levels.  He will send now.

## 2016-12-01 NOTE — Progress Notes (Signed)
EPIC Encounter for ICM Monitoring  Patient Name: Carl [name] is a 66 y.o. male Date: 12/01/2016 Primary Care Physican: [name], MD Primary Cardiologist:McLean Electrophysiologist: Allred DryWeight:unknown        Spoke with mother, Heart Failure questions reviewed, pt asymptomatic    Thoracic impedance trending toward baseline after increase in Furosemide to 80 mg bid x 2 days.   Prescribed and confirmed he has returned to prior dosage of Furosemide 40 mg 2 tablets (80 mg total) in AM and 1 tab (40 mg) in PM  Labs:  11/11/2016 Creatinine 1.33, BUN 32, Potassium 4.8, Sodium 138, EGFR 55->60 10/21/2016 Creatinine 1.35, BUN 36, Potassium 5.3, Sodium 136, EGFR 54->60 09/30/2016 Creatinine 1.84, BUN 38, Potassuim 3.7, Sodium 142, EGFR 38-43 09/15/2016 Creatinine 1.60, BUN 32, Potassium 3.5, Sodium 140, EGFR 44-51 08/04/2016 Creatinine 1.77, BUN 51, Potassium 4.5, Sodium 136, EGFR 39-45 06/19/2016 Creatinine 1.37, BUN 21, Potassium 4.1, Sodium 138, EGFR 53->60 06/12/2016 Creatinine 1.47, BUN 31, Potassium 3.6, Sodium 138, EGFR 49-56  06/11/2016 Creatinine 1.95, BUN 40, Potassium 3.4, Sodium 138, EGFR 35-40  06/10/2016 Creatinine 2.28, BUN 46, Potassium 3.8, Sodium 139, EGFR 29-33  03/06/2016 Creatinine 0.97, BUN 16, Potassium 3.6, Sodium 139, EGFR >60->60  Recommendations: Copy of ICM check sent to Dr [name] and [name] for review and if any recommendations will call back.   Follow-up plan: ICM clinic phone appointment on 12/15/2016.  Office appointment scheduled on 01/12/2017 with Dr [name].  3 month ICM trend: 12/01/2016   1 Year ICM trend:      Rosalene Billings, RN 12/01/2016 2:39 PM

## 2016-12-15 ENCOUNTER — Ambulatory Visit (INDEPENDENT_AMBULATORY_CARE_PROVIDER_SITE_OTHER): Payer: Self-pay

## 2016-12-15 DIAGNOSIS — Z95810 Presence of automatic (implantable) cardiac defibrillator: Secondary | ICD-10-CM

## 2016-12-15 DIAGNOSIS — I5022 Chronic systolic (congestive) heart failure: Secondary | ICD-10-CM

## 2016-12-15 NOTE — Progress Notes (Signed)
EPIC Encounter for ICM Monitoring  Patient Name: Carl [name] is a 66 y.o. male Date: 12/15/2016 Primary Care Physican: [name], MD Primary Cardiologist:McLean Electrophysiologist: Allred DryWeight:unknown      Spoke with mother.  Heart Failure questions reviewed, pt asymptomatic.   Thoracic impedance abnormal suggesting fluid accumulation but has improved.  Confirmed he is taking Furosemide 40 mg 2 tablets (80 mg total) in AM and 1 tab (40 mg) in PM.       Per Dr Linton Ham 4/17 note patient prescribed tapering dose of Prednisone.   Labs:  12/02/2016 Creatinine 1.2,   BUN 24, Potassium 3.8, Sodium 141, EGFR 61 - Care Everywhere 11/11/2016 Creatinine 1.33, BUN 32, Potassium 4.8, Sodium 138, EGFR 55->60 10/21/2016 Creatinine 1.35, BUN 36, Potassium 5.3, Sodium 136, EGFR 54->60 09/30/2016 Creatinine 1.84, BUN 38, Potassuim 3.7, Sodium 142, EGFR 38-43 09/15/2016 Creatinine 1.60, BUN 32, Potassium 3.5, Sodium 140, EGFR 44-51 08/04/2016 Creatinine 1.77, BUN 51, Potassium 4.5, Sodium 136, EGFR 39-45 06/19/2016 Creatinine 1.37, BUN 21, Potassium 4.1, Sodium 138, EGFR 53->60 06/12/2016 Creatinine 1.47, BUN 31, Potassium 3.6, Sodium 138, EGFR 49-56  06/11/2016 Creatinine 1.95, BUN 40, Potassium 3.4, Sodium 138, EGFR 35-40  06/10/2016 Creatinine 2.28, BUN 46, Potassium 3.8, Sodium 139, EGFR 29-33  03/06/2016 Creatinine 0.97, BUN 16, Potassium 3.6, Sodium 139, EGFR >60->60   Recommendations:  Copy of ICM check sent to Dr [name] and [name] for review.   Follow-up plan: ICM clinic phone appointment on 12/29/2016.  Office appointment scheduled on 01/12/2017 with Dr [name].    3 month ICM trend: 12/15/2016   1 Year ICM trend:      Rosalene Billings, RN 12/15/2016 10:34 AM

## 2016-12-16 NOTE — Progress Notes (Signed)
Increase Lasix 80 mg bid x 3 days then back to Lasix 80 qam/40 qpm.

## 2016-12-16 NOTE — Progress Notes (Signed)
Call to patient's mother and advised Dr [name] ordered to increase Lasix 80 mg bid x 3 days then back to Lasix 80 qam/40 qpm.  She repeated instructions and verbalized understanding.  ICM remote transmission schedule for 12/22/2016 to recheck fluid levels.

## 2016-12-22 ENCOUNTER — Ambulatory Visit (INDEPENDENT_AMBULATORY_CARE_PROVIDER_SITE_OTHER): Payer: Self-pay

## 2016-12-22 DIAGNOSIS — Z95810 Presence of automatic (implantable) cardiac defibrillator: Secondary | ICD-10-CM

## 2016-12-22 DIAGNOSIS — I5022 Chronic systolic (congestive) heart failure: Secondary | ICD-10-CM

## 2016-12-22 NOTE — Progress Notes (Signed)
EPIC Encounter for ICM Monitoring  Patient Name: Carl [name] is a 66 y.o. male Date: 12/22/2016 Primary Care Physican: [name], MD Primary Cardiologist:McLean Electrophysiologist: Allred DryWeight:unknown       Spoke with mother.  She reported patient feels tired a lot but no fluid symptoms.     Thoracic impedance improved and closer to baseline but still abnormal suggesting fluid accumulation after taking 3 days of increased Lasix 80 mg bid (4/24-4/26).  Confirmed he is taking Furosemide 40 mg 2 tablets (80 mg total) in AM and 1 tab (40 mg) in PM.   Confirmed he has returned to prescribed dosage.   Labs:  12/02/2016 Creatinine 1.2,   BUN 24, Potassium 3.8, Sodium 141, EGFR 61 - Care Everywhere 11/11/2016 Creatinine 1.33, BUN 32, Potassium 4.8, Sodium 138, EGFR 55->60 10/21/2016 Creatinine 1.35, BUN 36, Potassium 5.3, Sodium 136, EGFR 54->60 09/30/2016 Creatinine 1.84, BUN 38, Potassuim 3.7, Sodium 142, EGFR 38-43 09/15/2016 Creatinine 1.60, BUN 32, Potassium 3.5, Sodium 140, EGFR 44-51 08/04/2016 Creatinine 1.77, BUN 51, Potassium 4.5, Sodium 136, EGFR 39-45 06/19/2016 Creatinine 1.37, BUN 21, Potassium 4.1, Sodium 138, EGFR 53->60 06/12/2016 Creatinine 1.47, BUN 31, Potassium 3.6, Sodium 138, EGFR 49-56  06/11/2016 Creatinine 1.95, BUN 40, Potassium 3.4, Sodium 138, EGFR 35-40  06/10/2016 Creatinine 2.28, BUN 46, Potassium 3.8, Sodium 139, EGFR 29-33  03/06/2016 Creatinine 0.97, BUN 16, Potassium 3.6, Sodium 139, EGFR >60->60   Recommendations: Copy of ICM check sent to Dr [name] and [name] for review.   Follow-up plan: ICM clinic phone appointment on 01/05/2017. Office appointment scheduled on 01/12/2017 with Dr [name].    3 month ICM trend: 12/22/2016   1 Year ICM trend:      Rosalene Billings, RN 12/22/2016 9:38 AM

## 2016-12-23 NOTE — Progress Notes (Signed)
Continue Lasix 80 mg bid for another 3 days then back to prior dose.

## 2016-12-23 NOTE — Progress Notes (Signed)
Call to mother.  Advised Dr [name] wants patient to take the Lasix 80 mg twice a day for 3 more days and then go back to 80 mg in AM and 40 mg PM after the 3rd day.  She verbalized understanding.  Next ICM remote transmission 01/05/2017

## 2016-12-25 ENCOUNTER — Encounter (HOSPITAL_COMMUNITY): Payer: Self-pay | Admitting: *Deleted

## 2017-01-05 ENCOUNTER — Ambulatory Visit (INDEPENDENT_AMBULATORY_CARE_PROVIDER_SITE_OTHER): Payer: Medicare Other

## 2017-01-05 DIAGNOSIS — Z95810 Presence of automatic (implantable) cardiac defibrillator: Secondary | ICD-10-CM

## 2017-01-05 DIAGNOSIS — I5023 Acute on chronic systolic (congestive) heart failure: Secondary | ICD-10-CM

## 2017-01-05 NOTE — Progress Notes (Signed)
EPIC Encounter for ICM Monitoring  Patient Name: Carl [name] is a 66 y.o. male Date: 01/05/2017 Primary Care Physican: [name], MD Primary Cardiologist:McLean Electrophysiologist: Allred DryWeight:unknown          Spoke with mother. Heart Failure questions reviewed, pt asymptomatic.   Thoracic impedance normal.  Confirmed he is taking Furosemide 40 mg 2 tablets (80 mg total) in AM and 1 tab (40 mg) in PM.     Labs:  12/02/2016 Creatinine 1.2, BUN 24, Potassium 3.8, Sodium 141, EGFR 61 - Care Everywhere 11/11/2016 Creatinine 1.33, BUN 32, Potassium 4.8, Sodium 138, EGFR 55->60 10/21/2016 Creatinine 1.35, BUN 36, Potassium 5.3, Sodium 136, EGFR 54->60 09/30/2016 Creatinine 1.84, BUN 38, Potassuim 3.7, Sodium 142, EGFR 38-43 09/15/2016 Creatinine 1.60, BUN 32, Potassium 3.5, Sodium 140, EGFR 44-51 08/04/2016 Creatinine 1.77, BUN 51, Potassium 4.5, Sodium 136, EGFR 39-45 06/19/2016 Creatinine 1.37, BUN 21, Potassium 4.1, Sodium 138, EGFR 53->60 06/12/2016 Creatinine 1.47, BUN 31, Potassium 3.6, Sodium 138, EGFR 49-56  06/11/2016 Creatinine 1.95, BUN 40, Potassium 3.4, Sodium 138, EGFR 35-40  06/10/2016 Creatinine 2.28, BUN 46, Potassium 3.8, Sodium 139, EGFR 29-33  03/06/2016 Creatinine 0.97, BUN 16, Potassium 3.6, Sodium 139, EGFR >60->60  Recommendations: No changes. Discussed to limit salt intake to 2000 mg/day and fluid intake to < 2 liters/day.  Encouraged to call for fluid symptoms or use local ER for any urgent symptoms.  Follow-up plan: ICM clinic phone appointment on 02/05/2017.  Office appointment scheduled on 01/12/2017 with HF clinic.  Copy of ICM check sent to primary cardiologist and device physician.   3 month ICM trend: 01/05/2017   1 Year ICM trend:      Rosalene Billings, RN 01/05/2017 12:24 PM

## 2017-01-12 ENCOUNTER — Inpatient Hospital Stay (HOSPITAL_COMMUNITY): Admission: RE | Admit: 2017-01-12 | Payer: Medicare Other | Source: Ambulatory Visit

## 2017-01-28 ENCOUNTER — Telehealth (HOSPITAL_COMMUNITY): Payer: Self-pay | Admitting: Vascular Surgery

## 2017-01-28 NOTE — Telephone Encounter (Signed)
Sent pt reminder letter , that 6/29  appt time was changed to a earlier time, asked pt to call back to confirm this appt

## 2017-02-02 ENCOUNTER — Other Ambulatory Visit (HOSPITAL_COMMUNITY): Payer: Self-pay | Admitting: Cardiology

## 2017-02-05 ENCOUNTER — Telehealth: Payer: Self-pay

## 2017-02-05 ENCOUNTER — Ambulatory Visit (INDEPENDENT_AMBULATORY_CARE_PROVIDER_SITE_OTHER): Payer: Medicare Other

## 2017-02-05 DIAGNOSIS — I5023 Acute on chronic systolic (congestive) heart failure: Secondary | ICD-10-CM | POA: Diagnosis not present

## 2017-02-05 DIAGNOSIS — I255 Ischemic cardiomyopathy: Secondary | ICD-10-CM

## 2017-02-05 DIAGNOSIS — I519 Heart disease, unspecified: Secondary | ICD-10-CM | POA: Diagnosis not present

## 2017-02-05 DIAGNOSIS — Z95810 Presence of automatic (implantable) cardiac defibrillator: Secondary | ICD-10-CM | POA: Diagnosis not present

## 2017-02-05 NOTE — Progress Notes (Signed)
EPIC Encounter for ICM Monitoring  Patient Name: Carl [name] is a 66 y.o. male Date: 02/05/2017 Primary Care Physican: [name], MD Primary Cardiologist:McLean Electrophysiologist: Allred DryWeight:unknown      Attempted call to patient or mother and unable to reach.   Transmission reviewed.    Thoracic impedance abnormal suggesting fluid accumulation but impedance has improved in the last month and trends closer to baseline.  Prescribed dosage:  Furosemide 40 mg 2 tablets (80 mg total) in AM and 1 tab (40 mg) in PM.    Labs:  12/02/2016 Creatinine 1.2, BUN 24, Potassium 3.8, Sodium 141, EGFR 61 - Care Everywhere 11/11/2016 Creatinine 1.33, BUN 32, Potassium 4.8, Sodium 138, EGFR 55->60 10/21/2016 Creatinine 1.35, BUN 36, Potassium 5.3, Sodium 136, EGFR 54->60 09/30/2016 Creatinine 1.84, BUN 38, Potassuim 3.7, Sodium 142, EGFR 38-43 09/15/2016 Creatinine 1.60, BUN 32, Potassium 3.5, Sodium 140, EGFR 44-51 08/04/2016 Creatinine 1.77, BUN 51, Potassium 4.5, Sodium 136, EGFR 39-45 06/19/2016 Creatinine 1.37, BUN 21, Potassium 4.1, Sodium 138, EGFR 53->60 06/12/2016 Creatinine 1.47, BUN 31, Potassium 3.6, Sodium 138, EGFR 49-56  06/11/2016 Creatinine 1.95, BUN 40, Potassium 3.4, Sodium 138, EGFR 35-40  06/10/2016 Creatinine 2.28, BUN 46, Potassium 3.8, Sodium 139, EGFR 29-33  03/06/2016 Creatinine 0.97, BUN 16, Potassium 3.6, Sodium 139, EGFR >60->60  Recommendations: NONE - Unable to reach patient   Follow-up plan: ICM clinic phone appointment on 02/19/2017.  Office appointment scheduled on 02/20/2017 with HF clinic.  Copy of ICM check sent to primary cardiologist and device physician.   3 month ICM trend: 02/05/2017   1 Year ICM trend:      Rosalene Billings, RN 02/05/2017 8:23 AM

## 2017-02-05 NOTE — Telephone Encounter (Signed)
Remote ICM transmission received.  Attempted patient or mother call and no answer or answering machine.

## 2017-02-19 ENCOUNTER — Telehealth: Payer: Self-pay

## 2017-02-19 ENCOUNTER — Ambulatory Visit (INDEPENDENT_AMBULATORY_CARE_PROVIDER_SITE_OTHER): Payer: Medicare Other

## 2017-02-19 DIAGNOSIS — Z95810 Presence of automatic (implantable) cardiac defibrillator: Secondary | ICD-10-CM

## 2017-02-19 DIAGNOSIS — I5022 Chronic systolic (congestive) heart failure: Secondary | ICD-10-CM

## 2017-02-19 NOTE — Progress Notes (Signed)
EPIC Encounter for ICM Monitoring  Patient Name: Carl [name] is a 66 y.o. male Date: 02/19/2017 Primary Care Physican: [name], MD Primary Cardiologist:McLean Electrophysiologist: Allred DryWeight:unknown       Attempted call to patient and unable to reach.  Transmission reviewed.    Thoracic impedance has returned to normal on 02/12/2017 and fluid index is < threshold.   Prescribed dosage:  Furosemide 40 mg 2 tablets (80 mg total) in AM and 1 tab (40 mg) in PM.   Labs:  12/02/2016 Creatinine 1.2, BUN 24, Potassium 3.8, Sodium 141, EGFR 61 - Care Everywhere 11/11/2016 Creatinine 1.33, BUN 32, Potassium 4.8, Sodium 138, EGFR 55->60 10/21/2016 Creatinine 1.35, BUN 36, Potassium 5.3, Sodium 136, EGFR 54->60 09/30/2016 Creatinine 1.84, BUN 38, Potassuim 3.7, Sodium 142, EGFR 38-43 09/15/2016 Creatinine 1.60, BUN 32, Potassium 3.5, Sodium 140, EGFR 44-51 08/04/2016 Creatinine 1.77, BUN 51, Potassium 4.5, Sodium 136, EGFR 39-45 06/19/2016 Creatinine 1.37, BUN 21, Potassium 4.1, Sodium 138, EGFR 53->60 06/12/2016 Creatinine 1.47, BUN 31, Potassium 3.6, Sodium 138, EGFR 49-56  06/11/2016 Creatinine 1.95, BUN 40, Potassium 3.4, Sodium 138, EGFR 35-40  06/10/2016 Creatinine 2.28, BUN 46, Potassium 3.8, Sodium 139, EGFR 29-33  03/06/2016 Creatinine 0.97, BUN 16, Potassium 3.6, Sodium 139, EGFR >60->60  Recommendations: NONE - Unable to reach patient   Follow-up plan: ICM clinic phone appointment on 03/10/2017.  Office appointment scheduled 02/20/2017 with Dr. [name].  Copy of ICM check sent to primary cardiologist and device physician.   3 month ICM trend: 02/19/2017   1 Year ICM trend:     Rosalene Billings, RN 02/19/2017 11:39 AM

## 2017-02-19 NOTE — Telephone Encounter (Signed)
Remote ICM transmission received.  Attempted patient call and no answer or answering machine.  

## 2017-02-20 ENCOUNTER — Other Ambulatory Visit (HOSPITAL_COMMUNITY): Payer: Self-pay | Admitting: Cardiology

## 2017-02-20 ENCOUNTER — Encounter (HOSPITAL_COMMUNITY): Payer: Self-pay

## 2017-02-20 ENCOUNTER — Encounter: Payer: Self-pay | Admitting: Cardiology

## 2017-02-20 ENCOUNTER — Ambulatory Visit (HOSPITAL_COMMUNITY)
Admission: RE | Admit: 2017-02-20 | Discharge: 2017-02-20 | Disposition: A | Discharge status: Home / Self Care | Payer: Medicare Other | Source: Ambulatory Visit | Attending: Cardiology | Admitting: Cardiology

## 2017-02-20 VITALS — BP 90/58 | HR 57 | Wt 167.8 lb

## 2017-02-20 DIAGNOSIS — Z8249 Family history of ischemic heart disease and other diseases of the circulatory system: Secondary | ICD-10-CM | POA: Diagnosis not present

## 2017-02-20 DIAGNOSIS — N183 Chronic kidney disease, stage 3 (moderate): Secondary | ICD-10-CM | POA: Diagnosis not present

## 2017-02-20 DIAGNOSIS — N182 Chronic kidney disease, stage 2 (mild): Secondary | ICD-10-CM | POA: Diagnosis not present

## 2017-02-20 DIAGNOSIS — E785 Hyperlipidemia, unspecified: Secondary | ICD-10-CM | POA: Insufficient documentation

## 2017-02-20 DIAGNOSIS — I340 Nonrheumatic mitral (valve) insufficiency: Secondary | ICD-10-CM | POA: Diagnosis not present

## 2017-02-20 DIAGNOSIS — Z951 Presence of aortocoronary bypass graft: Secondary | ICD-10-CM | POA: Insufficient documentation

## 2017-02-20 DIAGNOSIS — F329 Major depressive disorder, single episode, unspecified: Secondary | ICD-10-CM | POA: Diagnosis not present

## 2017-02-20 DIAGNOSIS — Z79899 Other long term (current) drug therapy: Secondary | ICD-10-CM | POA: Diagnosis not present

## 2017-02-20 DIAGNOSIS — I2510 Atherosclerotic heart disease of native coronary artery without angina pectoris: Secondary | ICD-10-CM | POA: Insufficient documentation

## 2017-02-20 DIAGNOSIS — Z87891 Personal history of nicotine dependence: Secondary | ICD-10-CM | POA: Diagnosis not present

## 2017-02-20 DIAGNOSIS — I5022 Chronic systolic (congestive) heart failure: Secondary | ICD-10-CM | POA: Diagnosis not present

## 2017-02-20 DIAGNOSIS — I255 Ischemic cardiomyopathy: Secondary | ICD-10-CM | POA: Insufficient documentation

## 2017-02-20 DIAGNOSIS — I519 Heart disease, unspecified: Secondary | ICD-10-CM

## 2017-02-20 DIAGNOSIS — Z7982 Long term (current) use of aspirin: Secondary | ICD-10-CM | POA: Diagnosis not present

## 2017-02-20 DIAGNOSIS — N6310 Unspecified lump in the right breast, unspecified quadrant: Secondary | ICD-10-CM | POA: Diagnosis not present

## 2017-02-20 DIAGNOSIS — F70 Mild intellectual disabilities: Secondary | ICD-10-CM | POA: Diagnosis not present

## 2017-02-20 LAB — BASIC METABOLIC PANEL
ANION GAP: 9 (ref 5–15)
BUN: 23 mg/dL — AB (ref 6–20)
CALCIUM: 9.4 mg/dL (ref 8.9–10.3)
CO2: 28 mmol/L (ref 22–32)
Chloride: 95 mmol/L — ABNORMAL LOW (ref 101–111)
Creatinine, Ser: 1.71 mg/dL — ABNORMAL HIGH (ref 0.61–1.24)
GFR calc Af Amer: 47 mL/min — ABNORMAL LOW (ref >60)
GFR, EST NON AFRICAN AMERICAN: 40 mL/min — AB (ref >60)
GLUCOSE: 109 mg/dL — AB (ref 65–99)
POTASSIUM: 3.3 mmol/L — AB (ref 3.5–5.1)
SODIUM: 132 mmol/L — AB (ref 135–145)

## 2017-02-20 LAB — BRAIN NATRIURETIC PEPTIDE: B NATRIURETIC PEPTIDE 5: 556.8 pg/mL — AB (ref 0.0–100.0)

## 2017-02-20 MED ORDER — EPLERENONE 25 MG PO TABS: 25.0000 mg | ORAL_TABLET | Freq: Every day | ORAL | 6 refills | Status: DC

## 2017-02-20 NOTE — Patient Instructions (Signed)
Stop Spironolactone  Start Eplerenone 25 mg daily  Labs today  Labs in 2 weeks  Mammogram  Your physician recommends that you schedule a follow-up appointment in: 3 months

## 2017-02-21 NOTE — Progress Notes (Signed)
Patient ID: Carl [name], male   DOB: 10-06-1950, 66 y.o.   MRN: [id number] PCP: Dr. Ginette Pitman Cardiology: Dr. [name]  66 yo with history of smoking and mild mental retardation was admitted to [facility] in 12/15 with dyspnea.  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  LHC showed 3 vessel disease with EF 15%.  Echo showed EF 15-20%.  Patient had CABG x 5.  It was difficult to wean him off pressors post-op.  He ended up having to start midodrine.  This was weaned off.  Admitted 10/17 with volume overload and AKI. Diuresed with IV lasix and transitiioned to 40 mg lasix daily. Spiro, dig, lisinopril stopped due to elevated creatinine 2.28. Discharge weight 163 pounds.    Echo in 2/18 showed EF 30-35%, mild LV dilation, rWMAs, moderate MR, mild to moderately decreased RV systolic function. Cardiolite in 2/18 showed large inferoseptal/inferior/inferolateral infarction with no ischemia.   No chest pain.  No exertional dyspnea when walking on flat ground though his energy level is not good.  He can climb a flight of steps.  No lightheadedness, palpitations.  He reports a nodule behind his right breast.  Weight down 1 lb.  Optivol: Fluid index < threshold, impedance stable.   Labs (12/15): K 4.1, creatinine 0.81, hgb 9.1 Labs (09/12/2014): K 3.7 Creatinine 0.96, digoxin 0.7 Labs (3/16): digoxin 0.8, LDL 62, HDL 40 Labs (4/16): K 4.3, creatinine 1.1 Labs (5/16) K 4.6, creatinine 1.24, digoxin 1.0 Labs (05/2016): K 3.6 Creatinine 1.47  => 1.37 Labs (11/17): LDL 61, HDL 41, K 4.2, creaitnine 1.1 Labs (2/18): K 3.7 => 5.3, creatinine 1.84 => 1.35, BNP 924 Labs (3/18): K 4.8, creatinine 1.33, BNP 552  PMH: 1. Smoker 2. Mild mental retardation 3. CAD: LHC (12/15) with 3 vessel disease.  CABG x 5 in 12/15 with LIMA-LAD, SVG-D1, sequential SVG-OM2/OM3, SVG-PDA.   - Cardiolite (2/18):  Large inferoseptal/inferior/inferolateral infarction with no ischemia, EF 29%.  4. Ischemic cardiomyopathy: Echo (12/15) with EF 15-20%,  wall motion abnormalities, mildly decreased RV systolic function, mild MR.  Echo (3/16) with EF 30-35%, severe LV dilation, moderate MR, PA systolic pressure 42 mmHg. Echo (8/16) with EF 25-30%, severely dilated LV, diffuse hypokinesis with inferior akinesis, restrictive diastolic function, RV mildly dilated with mildly decreased systolic function, moderate MR.  - Echo (10/17): EF 20-25%, moderate MR.  - Medtronic ICD.  - ACEI cough.  - Echo (2/18): EF 30-35%, mild LV dilation, regional WMAs, moderate diastolic dysfunction, normal RV size with mild to moderately decreased systolic function, moderate MR (likely infarct-related).  5. Depression 6. Vitiligo 7. Mitral regurgitation: Moderate on 2/18, likely infarct-related.  8. CKD stage III.   SH: Lives with mother, quit smoking in 12/15.  No ETOH.   FH: Brother and father with MIs.    ROS: All systems reviewed and negative except as per HPI.   Current Outpatient Prescriptions  Medication Sig Dispense Refill  . aspirin 81 MG tablet Take 81 mg by mouth 2 (two) times daily.    Marland Kitchen atorvastatin (LIPITOR) 40 MG tablet Take 1 tablet (40 mg total) by mouth daily at 6 PM. 30 tablet 1  . carvedilol (COREG) 3.125 MG tablet Take 3.125 mg by mouth 2 (two) times daily with a meal.    . citalopram (CELEXA) 20 MG tablet Take 1 tablet by mouth daily.  5  . cyanocobalamin 500 MCG tablet Take 500 mcg by mouth daily.    . ferrous sulfate 325 (65 FE) MG tablet Take 325  mg by mouth daily with breakfast.    . furosemide (LASIX) 40 MG tablet Take 2 tablets (80 mg total) in AM and 1 tab (40 mg) in PM 270 tablet 3  . lidocaine (XYLOCAINE) 2 % jelly Apply 1 application topically daily as needed for irritation.  3  . losartan (COZAAR) 25 MG tablet Take 0.5 tablets (12.5 mg total) by mouth at bedtime. 15 tablet 3  . losartan (COZAAR) 25 MG tablet Take 0.5 tablets (12.5 mg total) by mouth daily. In Evening 15 tablet 3  . omeprazole (PRILOSEC) 20 MG capsule Take 20 mg by  mouth 2 (two) times daily.    Marland Kitchen tiZANidine (ZANAFLEX) 4 MG tablet Take 4 mg by mouth every 6 (six) hours as needed for muscle spasms.    . traMADol (ULTRAM) 50 MG tablet Take 50 mg by mouth every 6 (six) hours as needed.    Marland Kitchen eplerenone (INSPRA) 25 MG tablet Take 1 tablet (25 mg total) by mouth daily. 30 tablet 6   No current facility-administered medications for this encounter.    BP (!) 90/58   Pulse (!) 57   Wt 167 lb 12 oz (76.1 kg)   SpO2 98%   BMI 22.75 kg/m   Filed Weights   02/20/17 1038  Weight: 167 lb 12 oz (76.1 kg)    General: NAD.  Neck: JVP 7 cm, no thyromegaly or thyroid nodule.  Lungs: Clear bilaterally CV: Nondisplaced PMI.  Heart regular S1/S2, no S3/S4, 1/6 HSM at apex.  No edema.  No carotid bruit.  Normal pedal pulses.  Abdomen: Soft, nontender, no hepatosplenomegaly, no distention.  Skin: Vitiligo, there is a small mass palpable behind right nipple.  Neurologic: Alert and oriented x 3.  Psych: Flat affect. Extremities: No clubbing or cyanosis.  HEENT: Normal.   Assessment/Plan: 1. CAD: status post CABG.  Cardiolite in 2/18 showed infarction, no ischemia.  No recent chest pain.  - Continue ASA 81 and statin.  2. Chronic systolic CHF: Ischemic CMP.  Echo 2/18 with EF 30-35%, mild to moderate RV dysfunction.  Has Medtronic ICD. NYHA class II-III symptoms.  He does not look volume overloaded by exam or by Optivol interrogation.   - Continue Coreg 3.125 mg bid. Will not increase with soft BP.  - Continue losartan 12.5 mg qhs.   - Given possible gynecomastia, will have him stop spironolactone and start eplerenone 25 mg daily.  BMET/BNP today and again in 10 days.   - Continue Lasix 80 qam/40 qpm.   3. Smoking: He has quit smoking since CABG. 4. Depression: On Celexa.  5. Hyperlipidemia: Good lipids 11/17, continue statin.     6. Mitral regurgitation: Suspect infarct-related MR.  MR moderate on last echo.  7. CKD: Stage III. BMET today.  8. Breast mass: Behind  nipple on right.  I will arrange a mammogram, and as above, will stop spironolactone/start eplerenone.   Followup in 3 months.   [name] 02/21/2017

## 2017-02-23 ENCOUNTER — Telehealth (HOSPITAL_COMMUNITY): Payer: Self-pay | Admitting: *Deleted

## 2017-02-23 MED ORDER — POTASSIUM CHLORIDE CRYS ER 20 MEQ PO TBCR: 20.0000 meq | EXTENDED_RELEASE_TABLET | Freq: Every day | ORAL | 3 refills | Status: DC

## 2017-02-23 NOTE — Telephone Encounter (Signed)
-----   Message from [name], MD sent at 02/22/2017  9:28 PM EDT ----- Increase total daily KCl by 20 mEq daily.  BMET 1 week.

## 2017-02-26 ENCOUNTER — Ambulatory Visit: Payer: Medicare Other

## 2017-02-26 ENCOUNTER — Ambulatory Visit
Admission: RE | Admit: 2017-02-26 | Discharge: 2017-02-26 | Disposition: A | Discharge status: Home / Self Care | Payer: Medicare Other | Source: Ambulatory Visit | Attending: Cardiology | Admitting: Cardiology

## 2017-02-26 DIAGNOSIS — N6310 Unspecified lump in the right breast, unspecified quadrant: Secondary | ICD-10-CM

## 2017-03-06 ENCOUNTER — Ambulatory Visit (HOSPITAL_COMMUNITY)
Admission: RE | Admit: 2017-03-06 | Discharge: 2017-03-06 | Disposition: A | Discharge status: Home / Self Care | Payer: Medicare Other | Source: Ambulatory Visit | Attending: Internal Medicine | Admitting: Internal Medicine

## 2017-03-06 DIAGNOSIS — I519 Heart disease, unspecified: Secondary | ICD-10-CM | POA: Diagnosis not present

## 2017-03-06 LAB — BASIC METABOLIC PANEL
Anion gap: 7 (ref 5–15)
BUN: 13 mg/dL (ref 6–20)
CHLORIDE: 99 mmol/L — AB (ref 101–111)
CO2: 27 mmol/L (ref 22–32)
CREATININE: 1.62 mg/dL — AB (ref 0.61–1.24)
Calcium: 9.3 mg/dL (ref 8.9–10.3)
GFR calc non Af Amer: 43 mL/min — ABNORMAL LOW (ref >60)
GFR, EST AFRICAN AMERICAN: 50 mL/min — AB (ref >60)
Glucose, Bld: 100 mg/dL — ABNORMAL HIGH (ref 65–99)
Potassium: 3.8 mmol/L (ref 3.5–5.1)
Sodium: 133 mmol/L — ABNORMAL LOW (ref 135–145)

## 2017-03-10 ENCOUNTER — Ambulatory Visit (INDEPENDENT_AMBULATORY_CARE_PROVIDER_SITE_OTHER): Payer: Medicare Other | Admitting: *Deleted

## 2017-03-10 ENCOUNTER — Telehealth: Payer: Self-pay | Admitting: Cardiology

## 2017-03-10 DIAGNOSIS — I255 Ischemic cardiomyopathy: Secondary | ICD-10-CM

## 2017-03-10 DIAGNOSIS — Z95810 Presence of automatic (implantable) cardiac defibrillator: Secondary | ICD-10-CM

## 2017-03-10 DIAGNOSIS — I5022 Chronic systolic (congestive) heart failure: Secondary | ICD-10-CM

## 2017-03-10 NOTE — Telephone Encounter (Signed)
Spoke with pt and reminded pt of remote transmission that is due today. Pt verbalized understanding.   

## 2017-03-10 NOTE — Progress Notes (Signed)
EPIC Encounter for ICM Monitoring  Patient Name: Carl [name] is a 66 y.o. male Date: 03/10/2017 Primary Care Physican: [name], MD Primary Cardiologist:[place] Electrophysiologist: Allred DryWeight:unknown      Spoke with mother. Heart Failure questions reviewed, pt asymptomatic.   Thoracic impedance normal.  Prescribed dosage: Furosemide 40 mg 2 tablets (80 mg total) in AM and 1 tab (40 mg) in PM.  Potassium 20 mEq 1 tablet daily.    Recommendations:  Advised no changes.    Encouraged to call for fluid symptoms.  Follow-up plan: ICM clinic phone appointment on 04/10/2017.    Copy of ICM check sent to device physician.   3 month ICM trend: 03/10/2017   1 Year ICM trend:      Rosalene Billings, RN 03/10/2017 12:11 PM

## 2017-03-11 ENCOUNTER — Encounter: Payer: Self-pay | Admitting: Cardiology

## 2017-03-12 LAB — CUP PACEART REMOTE DEVICE CHECK
Battery Remaining Longevity: 126 mo
HIGH POWER IMPEDANCE MEASURED VALUE: 69 Ohm
Implantable Lead Implant Date: 20160906
Implantable Pulse Generator Implant Date: 20160906
Lead Channel Impedance Value: 323 Ohm
Lead Channel Pacing Threshold Amplitude: 0.75 V
Lead Channel Pacing Threshold Pulse Width: 0.4 ms
Lead Channel Sensing Intrinsic Amplitude: 7.125 mV
Lead Channel Setting Pacing Pulse Width: 0.4 ms
Lead Channel Setting Sensing Sensitivity: 0.3 mV
MDC IDC LEAD LOCATION: 753860
MDC IDC MSMT BATTERY VOLTAGE: 3.02 V
MDC IDC MSMT LEADCHNL RV IMPEDANCE VALUE: 380 Ohm
MDC IDC MSMT LEADCHNL RV SENSING INTR AMPL: 7.125 mV
MDC IDC SESS DTM: 20180717155924
MDC IDC SET LEADCHNL RV PACING AMPLITUDE: 2.5 V
MDC IDC STAT BRADY RV PERCENT PACED: 0.15 %

## 2017-03-25 ENCOUNTER — Encounter: Payer: Self-pay | Admitting: Cardiology

## 2017-04-01 ENCOUNTER — Other Ambulatory Visit: Payer: Self-pay | Admitting: Internal Medicine

## 2017-04-01 DIAGNOSIS — N5089 Other specified disorders of the male genital organs: Secondary | ICD-10-CM

## 2017-04-03 ENCOUNTER — Ambulatory Visit
Admission: RE | Admit: 2017-04-03 | Discharge: 2017-04-03 | Disposition: A | Discharge status: Home / Self Care | Payer: Medicare Other | Source: Ambulatory Visit | Attending: Internal Medicine | Admitting: Internal Medicine

## 2017-04-03 DIAGNOSIS — N433 Hydrocele, unspecified: Secondary | ICD-10-CM | POA: Diagnosis not present

## 2017-04-03 DIAGNOSIS — N5089 Other specified disorders of the male genital organs: Secondary | ICD-10-CM | POA: Diagnosis present

## 2017-04-10 ENCOUNTER — Ambulatory Visit (INDEPENDENT_AMBULATORY_CARE_PROVIDER_SITE_OTHER): Payer: Medicare Other

## 2017-04-10 DIAGNOSIS — Z95810 Presence of automatic (implantable) cardiac defibrillator: Secondary | ICD-10-CM

## 2017-04-10 DIAGNOSIS — I5022 Chronic systolic (congestive) heart failure: Secondary | ICD-10-CM

## 2017-04-10 NOTE — Progress Notes (Signed)
EPIC Encounter for ICM Monitoring  Patient Name: Carl [name] is a 66 y.o. male Date: 04/10/2017 Primary Care Physican: [name], MD Primary Cardiologist:[place] Electrophysiologist: Allred DryWeight:unknown                                                    Spoke with mother. Heart Failure questions reviewed, pt asymptomatic.   Thoracic impedance trending just below baseline and fluid index < threshold.  Prescribed dosage: Furosemide 40 mg 2 tablets (80 mg total) in AM and 1 tab (40 mg) in PM.  Potassium 20 mEq 1 tablet daily.   Recommendations:  Advised if any recommendations will call her back.   Follow-up plan: ICM clinic phone appointment on 05/11/2017.  Office appointment scheduled 05/25/2017 with HF clinic.  Copy of ICM check sent to Dr [name] and [name].   3 month ICM trend: 04/10/2017   1 Year ICM trend:      Rosalene Billings, RN 04/10/2017 9:26 AM

## 2017-04-15 ENCOUNTER — Telehealth (HOSPITAL_COMMUNITY): Payer: Self-pay

## 2017-04-15 NOTE — Telephone Encounter (Signed)
Patient's mother calling to report patient was seen by PCP and a lot of his cardiac meds were changed (diuretic decreased, new meds started, some stopped). Would like to be seen a few weeks sooner than scheduled to make sure these changes are appropriate.  Scheduled to be seen in 2 weeks with [facility] CHF clinic and advised to bring paperwork from that apt as well as all meds.  Renee Pain, RN

## 2017-04-28 ENCOUNTER — Ambulatory Visit (INDEPENDENT_AMBULATORY_CARE_PROVIDER_SITE_OTHER): Payer: Medicare Other | Admitting: Urology

## 2017-04-28 ENCOUNTER — Encounter (HOSPITAL_COMMUNITY): Payer: Self-pay

## 2017-04-28 ENCOUNTER — Ambulatory Visit (HOSPITAL_COMMUNITY)
Admission: RE | Admit: 2017-04-28 | Discharge: 2017-04-28 | Disposition: A | Discharge status: Home / Self Care | Payer: Medicare Other | Source: Ambulatory Visit | Attending: Cardiology | Admitting: Cardiology

## 2017-04-28 ENCOUNTER — Encounter: Payer: Self-pay | Admitting: Urology

## 2017-04-28 VITALS — BP 86/60 | HR 66 | Wt 176.4 lb

## 2017-04-28 DIAGNOSIS — N183 Chronic kidney disease, stage 3 (moderate): Secondary | ICD-10-CM | POA: Insufficient documentation

## 2017-04-28 DIAGNOSIS — N6310 Unspecified lump in the right breast, unspecified quadrant: Secondary | ICD-10-CM | POA: Diagnosis not present

## 2017-04-28 DIAGNOSIS — N62 Hypertrophy of breast: Secondary | ICD-10-CM | POA: Diagnosis not present

## 2017-04-28 DIAGNOSIS — I2510 Atherosclerotic heart disease of native coronary artery without angina pectoris: Secondary | ICD-10-CM | POA: Insufficient documentation

## 2017-04-28 DIAGNOSIS — Z79899 Other long term (current) drug therapy: Secondary | ICD-10-CM | POA: Insufficient documentation

## 2017-04-28 DIAGNOSIS — F329 Major depressive disorder, single episode, unspecified: Secondary | ICD-10-CM | POA: Diagnosis not present

## 2017-04-28 DIAGNOSIS — I340 Nonrheumatic mitral (valve) insufficiency: Secondary | ICD-10-CM | POA: Insufficient documentation

## 2017-04-28 DIAGNOSIS — I5022 Chronic systolic (congestive) heart failure: Secondary | ICD-10-CM | POA: Insufficient documentation

## 2017-04-28 DIAGNOSIS — Z87891 Personal history of nicotine dependence: Secondary | ICD-10-CM | POA: Insufficient documentation

## 2017-04-28 DIAGNOSIS — N433 Hydrocele, unspecified: Secondary | ICD-10-CM | POA: Insufficient documentation

## 2017-04-28 DIAGNOSIS — Z7982 Long term (current) use of aspirin: Secondary | ICD-10-CM | POA: Diagnosis not present

## 2017-04-28 DIAGNOSIS — Z125 Encounter for screening for malignant neoplasm of prostate: Secondary | ICD-10-CM | POA: Diagnosis not present

## 2017-04-28 DIAGNOSIS — N430 Encysted hydrocele: Secondary | ICD-10-CM

## 2017-04-28 DIAGNOSIS — I255 Ischemic cardiomyopathy: Secondary | ICD-10-CM | POA: Diagnosis not present

## 2017-04-28 DIAGNOSIS — N182 Chronic kidney disease, stage 2 (mild): Secondary | ICD-10-CM | POA: Diagnosis not present

## 2017-04-28 DIAGNOSIS — E785 Hyperlipidemia, unspecified: Secondary | ICD-10-CM | POA: Diagnosis not present

## 2017-04-28 DIAGNOSIS — I5023 Acute on chronic systolic (congestive) heart failure: Secondary | ICD-10-CM

## 2017-04-28 DIAGNOSIS — Z951 Presence of aortocoronary bypass graft: Secondary | ICD-10-CM | POA: Diagnosis not present

## 2017-04-28 LAB — MICROSCOPIC EXAMINATION
Epithelial Cells (non renal): NONE SEEN /hpf (ref 0–10)
RBC MICROSCOPIC, UA: NONE SEEN /HPF (ref 0–?)
WBC, UA: NONE SEEN /hpf (ref 0–?)

## 2017-04-28 LAB — BASIC METABOLIC PANEL
Anion gap: 7 (ref 5–15)
BUN: 31 mg/dL — AB (ref 6–20)
CALCIUM: 9.3 mg/dL (ref 8.9–10.3)
CHLORIDE: 102 mmol/L (ref 101–111)
CO2: 30 mmol/L (ref 22–32)
CREATININE: 1.97 mg/dL — AB (ref 0.61–1.24)
GFR calc non Af Amer: 34 mL/min — ABNORMAL LOW (ref >60)
GFR, EST AFRICAN AMERICAN: 39 mL/min — AB (ref >60)
Glucose, Bld: 85 mg/dL (ref 65–99)
Potassium: 3.9 mmol/L (ref 3.5–5.1)
SODIUM: 139 mmol/L (ref 135–145)

## 2017-04-28 LAB — URINALYSIS, COMPLETE
Bilirubin, UA: NEGATIVE
GLUCOSE, UA: NEGATIVE
KETONES UA: NEGATIVE
Leukocytes, UA: NEGATIVE
Nitrite, UA: NEGATIVE
PROTEIN UA: NEGATIVE
RBC, UA: NEGATIVE
SPEC GRAV UA: 1.015 (ref 1.005–1.030)
Urobilinogen, Ur: 1 mg/dL (ref 0.2–1.0)
pH, UA: 5.5 (ref 5.0–7.5)

## 2017-04-28 LAB — CBC
HCT: 38.6 % — ABNORMAL LOW (ref 39.0–52.0)
Hemoglobin: 12.5 g/dL — ABNORMAL LOW (ref 13.0–17.0)
MCH: 32.2 pg (ref 26.0–34.0)
MCHC: 32.4 g/dL (ref 30.0–36.0)
MCV: 99.5 fL (ref 78.0–100.0)
PLATELETS: 158 10*3/uL (ref 150–400)
RBC: 3.88 MIL/uL — ABNORMAL LOW (ref 4.22–5.81)
RDW: 14 % (ref 11.5–15.5)
WBC: 4.8 10*3/uL (ref 4.0–10.5)

## 2017-04-28 NOTE — Progress Notes (Signed)
Patient ID: Carl [name], male   DOB: 1951/03/20, 66 y.o.   MRN: [id number] PCP: Dr. Ginette Pitman Cardiology: Dr. [name]  66 yo with history of smoking and mild mental retardation was admitted to [facility] in 12/15 with dyspnea.  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  LHC showed 3 vessel disease with EF 15%.  Echo showed EF 15-20%.  Patient had CABG x 5.  It was difficult to wean him off pressors post-op.  He ended up having to start midodrine.  This was weaned off.  Admitted 10/17 with volume overload and AKI. Diuresed with IV lasix and transitiioned to 40 mg lasix daily. Spiro, dig, lisinopril stopped due to elevated creatinine 2.28. Discharge weight 163 pounds.    Echo in 2/18 showed EF 30-35%, mild LV dilation, rWMAs, moderate MR, mild to moderately decreased RV systolic function. Cardiolite in 2/18 showed large inferoseptal/inferior/inferolateral infarction with no ischemia.   He returns today for HF follow up. Recently he was given a DUI, no ETOH but says that 2 substances were found. His PCP stopped Zanaflex and Tramadol. Denies SOB, taking all medications. Mother present at visit and says that he has been more tired lately.   Optivol: Fluid below threshold.   Labs (12/15): K 4.1, creatinine 0.81, hgb 9.1 Labs (09/12/2014): K 3.7 Creatinine 0.96, digoxin 0.7 Labs (3/16): digoxin 0.8, LDL 62, HDL 40 Labs (4/16): K 4.3, creatinine 1.1 Labs (5/16) K 4.6, creatinine 1.24, digoxin 1.0 Labs (05/2016): K 3.6 Creatinine 1.47  => 1.37 Labs (11/17): LDL 61, HDL 41, K 4.2, creaitnine 1.1 Labs (2/18): K 3.7 => 5.3, creatinine 1.84 => 1.35, BNP 924 Labs (3/18): K 4.8, creatinine 1.33, BNP 552  PMH: 1. Smoker 2. Mild mental retardation 3. CAD: LHC (12/15) with 3 vessel disease.  CABG x 5 in 12/15 with LIMA-LAD, SVG-D1, sequential SVG-OM2/OM3, SVG-PDA.   - Cardiolite (2/18):  Large inferoseptal/inferior/inferolateral infarction with no ischemia, EF 29%.  4. Ischemic cardiomyopathy: Echo (12/15) with EF  15-20%, wall motion abnormalities, mildly decreased RV systolic function, mild MR.  Echo (3/16) with EF 30-35%, severe LV dilation, moderate MR, PA systolic pressure 42 mmHg. Echo (8/16) with EF 25-30%, severely dilated LV, diffuse hypokinesis with inferior akinesis, restrictive diastolic function, RV mildly dilated with mildly decreased systolic function, moderate MR.  - Echo (10/17): EF 20-25%, moderate MR.  - Medtronic ICD.  - ACEI cough.  - Echo (2/18): EF 30-35%, mild LV dilation, regional WMAs, moderate diastolic dysfunction, normal RV size with mild to moderately decreased systolic function, moderate MR (likely infarct-related).  5. Depression 6. Vitiligo 7. Mitral regurgitation: Moderate on 2/18, likely infarct-related.  8. CKD stage III.   SH: Lives with mother, quit smoking in 12/15.  No ETOH.   FH: Brother and father with MIs.    ROS: All systems reviewed and negative except as per HPI.   Current Outpatient Prescriptions  Medication Sig Dispense Refill  . aspirin 81 MG tablet Take 81 mg by mouth 2 (two) times daily.    Marland Kitchen atorvastatin (LIPITOR) 40 MG tablet Take 1 tablet (40 mg total) by mouth daily at 6 PM. 30 tablet 1  . carvedilol (COREG) 3.125 MG tablet Take 3.125 mg by mouth 2 (two) times daily with a meal.    . citalopram (CELEXA) 20 MG tablet Take 1 tablet by mouth daily.  5  . cyanocobalamin 500 MCG tablet Take 500 mcg by mouth daily.    Marland Kitchen eplerenone (INSPRA) 25 MG tablet Take  1 tablet (25 mg total) by mouth daily. 30 tablet 6  . ferrous sulfate 325 (65 FE) MG tablet Take 325 mg by mouth daily with breakfast.    . furosemide (LASIX) 40 MG tablet Take 2 tablets (80 mg total) in AM and 1 tab (40 mg) in PM 270 tablet 3  . losartan (COZAAR) 25 MG tablet Take 0.5 tablets (12.5 mg total) by mouth daily. In Evening 15 tablet 3  . omeprazole (PRILOSEC) 20 MG capsule Take 20 mg by mouth 2 (two) times daily.     No current facility-administered medications for this encounter.     BP (!) 86/60   Pulse 66   Wt 176 lb 6.4 oz (80 kg)   SpO2 100%   BMI 23.92 kg/m   Filed Weights   04/28/17 1125  Weight: 176 lb 6.4 oz (80 kg)    General: Well appearing. No resp difficulty. HEENT: Normal Neck: Supple. JVP 5-6. Carotids 2+ bilat; no bruits. No thyromegaly or nodule noted. Cor: PMI nondisplaced. RRR, No M/G/R noted Lungs: CTAB, normal effort. Abdomen: Soft, non-tender, non-distended, no HSM. No bruits or masses. +BS  Extremities: No cyanosis, clubbing, rash, R and LLE no edema.  Neuro: Alert & orientedx3, cranial nerves grossly intact. moves all 4 extremities w/o difficulty. Affect pleasant   Assessment/Plan: 1. CAD: status post CABG.  Cardiolite in 2/18 showed infarction, no ischemia.   - Denies chest pain.  - Continue ASA and statin.   2. Chronic systolic CHF: Ischemic CMP.  Echo 2/18 with EF 30-35%, mild to moderate RV dysfunction.  Has Medtronic ICD.  - NYHA II-III - Volume stable on exam and confirmed by Optivol.  - BP soft today, he has chronically soft BP's historically. He does not feel dizzy or lightheaded. Will hold his Coreg and losartan for 2 days. His mother will check his BP at home and call us if SBP <90.  - Continue eplerenone 25 mg daily.  - Continue Lasix 80 qam/40 qpm.    3. Smoking:  - He has stopped smoking.   4. Depression:  - Continue Celexa.   5. Hyperlipidemia:  - Continue statin.       6. Mitral regurgitation: - Likely infarct related MR. Stable by last Echo.   7. CKD:  - BMET today.   8. Breast mass: - Mammogram confirmed gynecomastia  Follow up in 1 month with Dr. [name] as scheduled.   Arbutus Leas 04/28/2017

## 2017-04-28 NOTE — Patient Instructions (Signed)
HOLD Coreg and Losartan today and tomorrow.  Keep a blood pressure log at home and call us next week to report findings.  Routine lab work today. Will notify you of abnormal results, otherwise no news is good news!  Follow up 1 month with Dr. [name]. Take all medication as prescribed the day of your appointment. Bring all medications with you to your appointment.  ___________________________________________________________________  ___________________________________________________________________  Do the following things EVERYDAY: 1) Weigh yourself in the morning before breakfast. Write it down and keep it in a log. 2) Take your medicines as prescribed 3) Eat low salt foods-Limit salt (sodium) to 2000 mg per day.  4) Stay as active as you can everyday 5) Limit all fluids for the day to less than 2 liters

## 2017-04-28 NOTE — Progress Notes (Signed)
04/28/2017 8:18 AM   Carl [name] [birth year] [id number]  Referring provider: [name], [place] [facility] [place], [place] [address]  CC: new patient, hydrocele  HPI:  1. Prostate cancer screening - no FHX prostate cancer. He has extensive CV comorbidity. DRE 2018 40gm smooth.   2. Small Right Hydrocele (subclinical) - small rt hydrocele on [place] 2018 on eval for left  scrotal swelling. No masses. He has CHF on lasix, Dig. Exam 04/2017 with very subtle Rt hydrocele that is non-painful, no mass effect.   PMH sig for CAD/CABG, CHF/Lasix/Dig,AICD, ortho leg surgeyr x2, mild MR (lives with mother). His PCP is Dr. Ginette Pitman.  Today "Carl [name]" is seen as new patient for above.    PMH: Past Medical History:  Diagnosis Date  . Arthritis   . Brunner's gland hyperplasia of duodenum   . CHF (congestive heart failure) ([place])   . Coronary artery disease   . External hemorrhoids   . GERD (gastroesophageal reflux disease)   . Hearing loss   . HH (hiatus hernia)   . Internal hemorrhoids   . Ischemic cardiomyopathy   . Leg fracture, left   . Myocardial infarction ([place])   . Paronychia   . Psoriasis   . PUD (peptic ulcer disease)   . Schatzki's ring   . Tubular adenoma of colon   . Vitiligo     Surgical History: Past Surgical History:  Procedure Laterality Date  . CORONARY ARTERY BYPASS GRAFT N/A 08/03/2014   Procedure: CORONARY ARTERY BYPASS GRAFTING (CABG);  Surgeon: [name], MD;  Location: [place];  Service: Open Heart Surgery;  Laterality: N/A;  . EP IMPLANTABLE DEVICE N/A 05/01/2015   MDT ICD implanted for primary prevention of sudden death  . ESOPHAGOGASTRODUODENOSCOPY (EGD) WITH PROPOFOL N/A 04/16/2015   Procedure: ESOPHAGOGASTRODUODENOSCOPY (EGD) WITH PROPOFOL;  Surgeon: [name], MD;  Location: [facility] ENDOSCOPY;  Service: Endoscopy;  Laterality: N/A;  . HERNIA REPAIR    . TEE WITHOUT CARDIOVERSION N/A 08/03/2014   Procedure:  TRANSESOPHAGEAL ECHOCARDIOGRAM (TEE);  Surgeon: [name], MD;  Location: [place];  Service: Open Heart Surgery;  Laterality: N/A;    Home Medications:  Allergies as of 04/28/2017      Reactions   Spironolactone    gynecomastia       Medication List       Accurate as of 04/28/17  8:18 AM. Always use your most recent med list.          aspirin 81 MG tablet Take 81 mg by mouth 2 (two) times daily.   atorvastatin 40 MG tablet Commonly known as:  LIPITOR Take 1 tablet (40 mg total) by mouth daily at 6 PM.   carvedilol 3.125 MG tablet Commonly known as:  COREG Take 3.125 mg by mouth 2 (two) times daily with a meal.   citalopram 20 MG tablet Commonly known as:  CELEXA Take 1 tablet by mouth daily.   cyanocobalamin 500 MCG tablet Take 500 mcg by mouth daily.   eplerenone 25 MG tablet Commonly known as:  INSPRA Take 1 tablet (25 mg total) by mouth daily.   ferrous sulfate 325 (65 FE) MG tablet Take 325 mg by mouth daily with breakfast.   furosemide 40 MG tablet Commonly known as:  LASIX Take 2 tablets (80 mg total) in AM and 1 tab (40 mg) in PM   lidocaine 2 % jelly Commonly known as:  XYLOCAINE Apply 1 application topically daily as needed  for irritation.   losartan 25 MG tablet Commonly known as:  COZAAR Take 0.5 tablets (12.5 mg total) by mouth at bedtime.   losartan 25 MG tablet Commonly known as:  COZAAR Take 0.5 tablets (12.5 mg total) by mouth daily. In Evening   omeprazole 20 MG capsule Commonly known as:  PRILOSEC Take 20 mg by mouth 2 (two) times daily.   potassium chloride SA 20 MEQ tablet Commonly known as:  K-DUR,KLOR-CON Take 1 tablet (20 mEq total) by mouth daily.   tiZANidine 4 MG tablet Commonly known as:  ZANAFLEX Take 4 mg by mouth every 6 (six) hours as needed for muscle spasms.   traMADol 50 MG tablet Commonly known as:  ULTRAM Take 50 mg by mouth every 6 (six) hours as needed.       Allergies:  Allergies  Allergen Reactions    . Spironolactone     gynecomastia     Family History: Family History  Problem Relation Age of Onset  . Valvular heart disease Mother        Ruptured valve  . CAD Father   . Heart Problems Brother        Stents x 4  . Diabetes Brother     Social History:  reports that he has quit smoking. He has never used smokeless tobacco. He reports that he does not drink alcohol or use drugs.   Review of Systems  Gastrointestinal (upper)  : Negative for upper GI symptoms  Gastrointestinal (lower) : Negative for lower GI symptoms  Constitutional : Negative for symptoms  Skin: Negative for skin symptoms  Eyes: Negative for eye symptoms  Ear/Nose/Throat : Negative for Ear/Nose/Throat symptoms  Hematologic/Lymphatic: Negative for Hematologic/Lymphatic symptoms  Cardiovascular : Negative for cardiovascular symptoms  Respiratory : Negative for respiratory symptoms  Endocrine: Negative for endocrine symptoms  Musculoskeletal: Negative for musculoskeletal symptoms  Neurological: Negative for neurological symptoms  Psychologic: Negative for psychiatric symptoms  Physical Exam: There were no vitals taken for this visit.  Constitutional:  Alert and oriented, No acute distress. Stigmata of mild MR. HEENT: [place] AT, moist mucus membranes.  Trachea midline, no masses. Cardiovascular: No clubbing, cyanosis, or edema. Respiratory: Normal respiratory effort, no increased work of breathing. GI: Abdomen is soft, nontender, nondistended, no abdominal masses GU: No CVA tenderness. Very sublte Rt hydrocele w/o mass effect or tenderness. No firm scortla masses. UNcirc'd phalus with retractile foreskin.  Skin: No rashes, bruises or suspicious lesions. Lymph: No cervical or inguinal adenopathy. Neurologic: Grossly intact, no focal deficits, moving all 4 extremities. Psychiatric: Normal mood and affect.  Laboratory Data: Lab Results  Component Value Date   WBC 7.2 06/10/2016   HGB  14.0 06/10/2016   HCT 41.1 06/10/2016   MCV 95.1 06/10/2016   PLT 137 (L) 06/10/2016    Lab Results  Component Value Date   CREATININE 1.62 (H) 03/06/2017    No results found for: PSA  No results found for: TESTOSTERONE  No results found for: HGBA1C  Urinalysis    Component Value Date/Time   COLORURINE YELLOW 06/10/2016 [place] 06/10/2016 1437   LABSPEC 1.006 06/10/2016 1437   PHURINE 5.0 06/10/2016 1437   [place] 06/10/2016 1437   [place] 06/10/2016 1437   [place] 06/10/2016 1437   [place] 06/10/2016 1437   [place] 06/10/2016 1437   NITRITE NEGATIVE 06/10/2016 1437   [place] 06/10/2016 1437    Pertinent Imaging: [place] 2018 reveiwed as per above.  Assessment & Plan:   1. Prostate cancer screening - exam reassuring. He is NOT candidate for PSA based screening given comorbidity.   2. Small Right Hydrocele - his exam is unimpressive. There is essentially no suspicion for neoplasm. This is likely NOT source of scrtoal swelling, but rather edema from his CHF that is very well controlled at present.  Rec observation. He and his mother were reassured.  RTC Urol PRN.  Alexis Frock, [facility] [address] [place], [place] [address] [phone]

## 2017-05-11 ENCOUNTER — Ambulatory Visit (INDEPENDENT_AMBULATORY_CARE_PROVIDER_SITE_OTHER): Payer: Medicare Other

## 2017-05-11 DIAGNOSIS — I5022 Chronic systolic (congestive) heart failure: Secondary | ICD-10-CM

## 2017-05-11 DIAGNOSIS — Z95810 Presence of automatic (implantable) cardiac defibrillator: Secondary | ICD-10-CM

## 2017-05-11 NOTE — Progress Notes (Signed)
EPIC Encounter for ICM Monitoring  Patient Name: Carl [name] is a 66 y.o. male Date: 05/11/2017 Primary Care Physican: [name], MD Primary Cardiologist:[place] Electrophysiologist: Allred DryWeight:unknown      Spoke with mother and patient.  Heart Failure questions reviewed, pt asymptomatic.   Thoracic impedance normal.  Prescribed dosage: Furosemide 40 mg 2 tablets (80 mg total) in AM and 1 tab (40 mg) in PM. Potassium 20 mEq 1 tablet daily.   Recommendations: No changes.  Advised to limit salt intake to 2000 mg/day and fluid intake to < 2 liters/day.  Encouraged to call for fluid symptoms.  Follow-up plan: ICM clinic phone appointment on 06/11/2017.   Copy of ICM check sent to Dr. Rayann Heman.   3 month ICM trend: 05/11/2017   1 Year ICM trend:      Rosalene Billings, RN 05/11/2017 10:46 AM

## 2017-05-18 ENCOUNTER — Telehealth (HOSPITAL_COMMUNITY): Payer: Self-pay | Admitting: *Deleted

## 2017-05-18 NOTE — Telephone Encounter (Signed)
Patient's mother called asking if he was suppose to be taking potassium 20 mEq.  I called her back and verified that he was not prescribed to take it.  She understands and no further questions.

## 2017-05-25 ENCOUNTER — Encounter (HOSPITAL_COMMUNITY): Payer: Self-pay

## 2017-05-25 ENCOUNTER — Other Ambulatory Visit: Payer: Self-pay | Admitting: Cardiology

## 2017-05-25 ENCOUNTER — Encounter (HOSPITAL_COMMUNITY): Payer: Medicare Other

## 2017-05-25 ENCOUNTER — Ambulatory Visit (HOSPITAL_COMMUNITY)
Admission: RE | Admit: 2017-05-25 | Discharge: 2017-05-25 | Disposition: A | Discharge status: Home / Self Care | Payer: Medicare Other | Source: Ambulatory Visit | Attending: Cardiology | Admitting: Cardiology

## 2017-05-25 VITALS — BP 92/64 | HR 73 | Wt 175.6 lb

## 2017-05-25 DIAGNOSIS — Z7982 Long term (current) use of aspirin: Secondary | ICD-10-CM | POA: Insufficient documentation

## 2017-05-25 DIAGNOSIS — I5022 Chronic systolic (congestive) heart failure: Secondary | ICD-10-CM | POA: Insufficient documentation

## 2017-05-25 DIAGNOSIS — Z87891 Personal history of nicotine dependence: Secondary | ICD-10-CM | POA: Insufficient documentation

## 2017-05-25 DIAGNOSIS — E785 Hyperlipidemia, unspecified: Secondary | ICD-10-CM | POA: Diagnosis not present

## 2017-05-25 DIAGNOSIS — I2510 Atherosclerotic heart disease of native coronary artery without angina pectoris: Secondary | ICD-10-CM | POA: Diagnosis not present

## 2017-05-25 DIAGNOSIS — Z951 Presence of aortocoronary bypass graft: Secondary | ICD-10-CM

## 2017-05-25 DIAGNOSIS — F329 Major depressive disorder, single episode, unspecified: Secondary | ICD-10-CM | POA: Insufficient documentation

## 2017-05-25 DIAGNOSIS — I519 Heart disease, unspecified: Secondary | ICD-10-CM

## 2017-05-25 DIAGNOSIS — N183 Chronic kidney disease, stage 3 (moderate): Secondary | ICD-10-CM | POA: Insufficient documentation

## 2017-05-25 DIAGNOSIS — Z79899 Other long term (current) drug therapy: Secondary | ICD-10-CM | POA: Insufficient documentation

## 2017-05-25 DIAGNOSIS — I255 Ischemic cardiomyopathy: Secondary | ICD-10-CM | POA: Diagnosis not present

## 2017-05-25 DIAGNOSIS — I340 Nonrheumatic mitral (valve) insufficiency: Secondary | ICD-10-CM | POA: Insufficient documentation

## 2017-05-25 LAB — BASIC METABOLIC PANEL
ANION GAP: 9 (ref 5–15)
BUN: 15 mg/dL (ref 6–20)
CHLORIDE: 95 mmol/L — AB (ref 101–111)
CO2: 34 mmol/L — ABNORMAL HIGH (ref 22–32)
Calcium: 9.3 mg/dL (ref 8.9–10.3)
Creatinine, Ser: 1.36 mg/dL — ABNORMAL HIGH (ref 0.61–1.24)
GFR calc non Af Amer: 53 mL/min — ABNORMAL LOW (ref >60)
Glucose, Bld: 106 mg/dL — ABNORMAL HIGH (ref 65–99)
POTASSIUM: 2.8 mmol/L — AB (ref 3.5–5.1)
SODIUM: 138 mmol/L (ref 135–145)

## 2017-05-25 MED ORDER — POTASSIUM CHLORIDE ER 20 MEQ PO TBCR: 40.0000 meq | EXTENDED_RELEASE_TABLET | Freq: Two times a day (BID) | ORAL | 0 refills | Status: DC

## 2017-05-25 NOTE — Progress Notes (Signed)
Patient ID: Carl [name], male   DOB: Sep 04, 1950, 66 y.o.   MRN: [id number] PCP: Dr. Ginette Pitman Cardiology: Dr. [name]  66 yo with history of smoking and mild mental retardation was admitted to [facility] in 12/15 with dyspnea.  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  LHC showed 3 vessel disease with EF 15%.  Echo showed EF 15-20%.  Patient had CABG x 5.  It was difficult to wean him off pressors post-op.  He ended up having to start midodrine.  This was weaned off.  Admitted 10/17 with volume overload and AKI. Diuresed with IV lasix and transitiioned to 40 mg lasix daily. Spiro, dig, lisinopril stopped due to elevated creatinine 2.28. Discharge weight 163 pounds.    Echo in 2/18 showed EF 30-35%, mild LV dilation, rWMAs, moderate MR, mild to moderately decreased RV systolic function. Cardiolite in 2/18 showed large inferoseptal/inferior/inferolateral infarction with no ischemia.   Returns today for HF follow up. Feels well, denies SOB with activity, no SOB with walking around the grocery store. He is concerned about his mother today, who is his primary caregiver. She is in the hospital now. His sister accompanied him to the visit. He denies chest pain, palpitations, orthopnea and PND.   Optivol: fluid below threshold. Activity 4 hours a day.   Labs (12/15): K 4.1, creatinine 0.81, hgb 9.1 Labs (09/12/2014): K 3.7 Creatinine 0.96, digoxin 0.7 Labs (3/16): digoxin 0.8, LDL 62, HDL 40 Labs (4/16): K 4.3, creatinine 1.1 Labs (5/16) K 4.6, creatinine 1.24, digoxin 1.0 Labs (05/2016): K 3.6 Creatinine 1.47  => 1.37 Labs (11/17): LDL 61, HDL 41, K 4.2, creaitnine 1.1 Labs (2/18): K 3.7 => 5.3, creatinine 1.84 => 1.35, BNP 924 Labs (3/18): K 4.8, creatinine 1.33, BNP 552 Labs (9/18): K 3.9, creatinine 1.97  PMH: 1. Smoker 2. Mild mental retardation 3. CAD: LHC (12/15) with 3 vessel disease.  CABG x 5 in 12/15 with LIMA-LAD, SVG-D1, sequential SVG-OM2/OM3, SVG-PDA.   - Cardiolite (2/18):  Large  inferoseptal/inferior/inferolateral infarction with no ischemia, EF 29%.  4. Ischemic cardiomyopathy: Echo (12/15) with EF 15-20%, wall motion abnormalities, mildly decreased RV systolic function, mild MR.  Echo (3/16) with EF 30-35%, severe LV dilation, moderate MR, PA systolic pressure 42 mmHg. Echo (8/16) with EF 25-30%, severely dilated LV, diffuse hypokinesis with inferior akinesis, restrictive diastolic function, RV mildly dilated with mildly decreased systolic function, moderate MR.  - Echo (10/17): EF 20-25%, moderate MR.  - Medtronic ICD.  - ACEI cough.  - Echo (2/18): EF 30-35%, mild LV dilation, regional WMAs, moderate diastolic dysfunction, normal RV size with mild to moderately decreased systolic function, moderate MR (likely infarct-related).  5. Depression 6. Vitiligo 7. Mitral regurgitation: Moderate on 2/18, likely infarct-related.  8. CKD stage III.   SH: Lives with mother, quit smoking in 12/15.  No ETOH.   FH: Brother and father with MIs.    ROS: All systems reviewed and negative except as per HPI.   Current Outpatient Prescriptions  Medication Sig Dispense Refill  . aspirin 81 MG tablet Take 81 mg by mouth 2 (two) times daily.    Marland Kitchen atorvastatin (LIPITOR) 40 MG tablet Take 1 tablet (40 mg total) by mouth daily at 6 PM. 30 tablet 1  . carvedilol (COREG) 3.125 MG tablet Take 3.125 mg by mouth 2 (two) times daily with a meal.    . citalopram (CELEXA) 20 MG tablet Take 1 tablet by mouth daily.  5  . cyanocobalamin 500  MCG tablet Take 500 mcg by mouth daily.    Marland Kitchen eplerenone (INSPRA) 25 MG tablet Take 1 tablet (25 mg total) by mouth daily. 30 tablet 6  . ferrous sulfate 325 (65 FE) MG tablet Take 325 mg by mouth daily with breakfast.    . furosemide (LASIX) 40 MG tablet Take 2 tablets (80 mg total) in AM and 1 tab (40 mg) in PM 270 tablet 3  . losartan (COZAAR) 25 MG tablet Take 0.5 tablets (12.5 mg total) by mouth daily. In Evening 15 tablet 3  . omeprazole (PRILOSEC) 20 MG  capsule Take 20 mg by mouth 2 (two) times daily.     No current facility-administered medications for this encounter.    BP 92/64 (BP Location: Left Arm, Patient Position: Sitting, Cuff Size: Normal)   Pulse 73   Wt 175 lb 9.6 oz (79.7 kg)   SpO2 96%   BMI 23.82 kg/m   Filed Weights   05/25/17 1410  Weight: 175 lb 9.6 oz (79.7 kg)    General: Well appearing. No resp difficulty. HEENT: Normal Neck: Supple. JVP 8. Carotids 2+ bilat; no bruits. No thyromegaly or nodule noted. Cor: PMI nondisplaced. RRR, No M/G/R noted Lungs: CTAB, normal effort. Abdomen: Soft, non-tender, non-distended, no HSM. No bruits or masses. +BS  Extremities: No cyanosis, clubbing, rash, R and LLE no edema.  Neuro: Alert & orientedx3, cranial nerves grossly intact. moves all 4 extremities w/o difficulty. Affect pleasant  Assessment/Plan: 1. CAD: status post CABG.  Cardiolite in 2/18 showed infarction, no ischemia.   - Denies chest pain - Continue ASA and statin. Check LFT's next visit.   2. Chronic systolic CHF: Ischemic CMP.  Echo 2/18 with EF 30-35%, mild to moderate RV dysfunction.  Has Medtronic ICD.  - NYHA II - Volume stable on exam and confirmed by Optivol.  - Has chronically soft BP. Continue losartan and eplerenone daily.  - Continue lasix 80 q am/40 q pm.   3. Smoking:  - No longer smoking.   4. Depression:  - Continue celexa.   5. Hyperlipidemia:  - Continue statin.      6. Mitral regurgitation: - Likely infarct related MR. - Stable by last Echo.   7. CKD:  - BMET today.   8. Breast mass: - Mammogram confirmed gynecomastia  BMET today, last creatinine bumped at 1.97. If remains high, can consider reducing lasix dose.   Arbutus Leas 05/25/2017   Patient seen with NP, agree with the above note.  He is stable symptomatically.  Volume status looks ok. I will continue him on the current medication regimen and will get a BMET.   [name] 05/26/2017

## 2017-05-25 NOTE — Patient Instructions (Signed)
Routine lab work today. Will notify you of abnormal results, otherwise no news is good news!  No changes to medication at this time.  Follow up 3 months. We will call you closer to this time, or you may call our office to schedule 1 month before you are due to be seen. Take all medication as prescribed the day of your appointment. Bring all medications with you to your appointment.  Do the following things EVERYDAY: 1) Weigh yourself in the morning before breakfast. Write it down and keep it in a log. 2) Take your medicines as prescribed 3) Eat low salt foods-Limit salt (sodium) to 2000 mg per day.  4) Stay as active as you can everyday 5) Limit all fluids for the day to less than 2 liters

## 2017-05-27 ENCOUNTER — Other Ambulatory Visit (HOSPITAL_COMMUNITY): Payer: Self-pay

## 2017-05-27 ENCOUNTER — Ambulatory Visit (HOSPITAL_COMMUNITY)
Admission: RE | Admit: 2017-05-27 | Discharge: 2017-05-27 | Disposition: A | Discharge status: Home / Self Care | Payer: Medicare Other | Source: Ambulatory Visit | Attending: Cardiology | Admitting: Cardiology

## 2017-05-27 DIAGNOSIS — I5022 Chronic systolic (congestive) heart failure: Secondary | ICD-10-CM | POA: Diagnosis not present

## 2017-05-27 DIAGNOSIS — I38 Endocarditis, valve unspecified: Secondary | ICD-10-CM

## 2017-05-27 LAB — BASIC METABOLIC PANEL
ANION GAP: 10 (ref 5–15)
BUN: 15 mg/dL (ref 6–20)
CALCIUM: 9.3 mg/dL (ref 8.9–10.3)
CO2: 31 mmol/L (ref 22–32)
Chloride: 97 mmol/L — ABNORMAL LOW (ref 101–111)
Creatinine, Ser: 1.47 mg/dL — ABNORMAL HIGH (ref 0.61–1.24)
GFR calc non Af Amer: 48 mL/min — ABNORMAL LOW (ref >60)
GFR, EST AFRICAN AMERICAN: 56 mL/min — AB (ref >60)
GLUCOSE: 110 mg/dL — AB (ref 65–99)
POTASSIUM: 3.4 mmol/L — AB (ref 3.5–5.1)
Sodium: 138 mmol/L (ref 135–145)

## 2017-06-01 ENCOUNTER — Encounter (HOSPITAL_COMMUNITY): Payer: Self-pay | Admitting: *Deleted

## 2017-06-01 ENCOUNTER — Other Ambulatory Visit (HOSPITAL_COMMUNITY): Payer: Self-pay | Admitting: Cardiology

## 2017-06-08 ENCOUNTER — Encounter (HOSPITAL_COMMUNITY): Payer: Self-pay | Admitting: *Deleted

## 2017-06-08 ENCOUNTER — Telehealth (HOSPITAL_COMMUNITY): Payer: Self-pay | Admitting: *Deleted

## 2017-06-08 MED ORDER — POTASSIUM CHLORIDE ER 20 MEQ PO TBCR: 20.0000 meq | EXTENDED_RELEASE_TABLET | Freq: Two times a day (BID) | ORAL | 3 refills | Status: DC

## 2017-06-08 NOTE — Telephone Encounter (Signed)
Pt's brother called to let us know their mother had passed away and he will be helping pt with his medications.  He states they received a letter in the mail to call our office for results.  Pt had labs done 10/3 w/low K, per Dr [name]: Notes recorded by [name], MD on 05/27/2017 at 3:13 PM EDT Increase total daily K by 20 mEq. BMET 10 days.  We were unable to reach pt so had mailed him a letter to call us back.    Brother states he just took over pt's med and is not exactly sure what pt is supposed to take but he does not see potassium (klor con) on his list.  He states all of pt's meds [facility] [place].  I called [facility] and they state pt is on the following meds and gets them filled monthly:  Klor-Con 20 meq daily Losartan 25 mg take 1/2 tab daily Eplerenone 25 mg daily Atorvastatin 40 mg daily Omeprazole 20 mg BID Citalopram 20 mg daily Furosemide 40 mg, take 2 tabs in AM and 1 tab in PM Carvedilol 3.125 mg BID  Med list updated, with increase of KCL to 20 meq BID, new rx sent in for this.  Message sent to pt and his brother via MyChart per their request with med list.  Prescription for repeat lab and copy of med list also mailed to pt's home, he will have labs done at pcp in [place] as it is closer and easier for pt.

## 2017-06-09 ENCOUNTER — Telehealth: Payer: Self-pay | Admitting: *Deleted

## 2017-06-09 NOTE — Telephone Encounter (Signed)
Patients brother, [name] left a msg on the refill vm stating that he spoke with a nurse yesterday and was instructed to increase the patients potassium to TID. He would like a call back at [phone] to discuss. Thanks, MI

## 2017-06-10 ENCOUNTER — Telehealth (HOSPITAL_COMMUNITY): Payer: Self-pay | Admitting: Surgery

## 2017-06-10 ENCOUNTER — Encounter: Payer: Self-pay | Admitting: Emergency Medicine

## 2017-06-10 ENCOUNTER — Observation Stay
Admission: EM | Admit: 2017-06-10 | Discharge: 2017-06-12 | Disposition: A | Discharge status: Home / Self Care | Payer: Medicare Other | Attending: Internal Medicine | Admitting: Internal Medicine

## 2017-06-10 DIAGNOSIS — I2510 Atherosclerotic heart disease of native coronary artery without angina pectoris: Secondary | ICD-10-CM | POA: Insufficient documentation

## 2017-06-10 DIAGNOSIS — I340 Nonrheumatic mitral (valve) insufficiency: Secondary | ICD-10-CM | POA: Diagnosis not present

## 2017-06-10 DIAGNOSIS — R0602 Shortness of breath: Secondary | ICD-10-CM

## 2017-06-10 DIAGNOSIS — Z87891 Personal history of nicotine dependence: Secondary | ICD-10-CM | POA: Diagnosis not present

## 2017-06-10 DIAGNOSIS — I130 Hypertensive heart and chronic kidney disease with heart failure and stage 1 through stage 4 chronic kidney disease, or unspecified chronic kidney disease: Principal | ICD-10-CM | POA: Insufficient documentation

## 2017-06-10 DIAGNOSIS — Z95810 Presence of automatic (implantable) cardiac defibrillator: Secondary | ICD-10-CM | POA: Diagnosis not present

## 2017-06-10 DIAGNOSIS — K5900 Constipation, unspecified: Secondary | ICD-10-CM | POA: Insufficient documentation

## 2017-06-10 DIAGNOSIS — I509 Heart failure, unspecified: Secondary | ICD-10-CM

## 2017-06-10 DIAGNOSIS — N183 Chronic kidney disease, stage 3 (moderate): Secondary | ICD-10-CM | POA: Insufficient documentation

## 2017-06-10 DIAGNOSIS — F329 Major depressive disorder, single episode, unspecified: Secondary | ICD-10-CM | POA: Insufficient documentation

## 2017-06-10 DIAGNOSIS — Z8249 Family history of ischemic heart disease and other diseases of the circulatory system: Secondary | ICD-10-CM | POA: Diagnosis not present

## 2017-06-10 DIAGNOSIS — J449 Chronic obstructive pulmonary disease, unspecified: Secondary | ICD-10-CM | POA: Diagnosis not present

## 2017-06-10 DIAGNOSIS — I255 Ischemic cardiomyopathy: Secondary | ICD-10-CM | POA: Diagnosis not present

## 2017-06-10 DIAGNOSIS — E785 Hyperlipidemia, unspecified: Secondary | ICD-10-CM | POA: Diagnosis not present

## 2017-06-10 DIAGNOSIS — K219 Gastro-esophageal reflux disease without esophagitis: Secondary | ICD-10-CM | POA: Insufficient documentation

## 2017-06-10 DIAGNOSIS — I700 Atherosclerosis of aorta: Secondary | ICD-10-CM | POA: Diagnosis not present

## 2017-06-10 DIAGNOSIS — I252 Old myocardial infarction: Secondary | ICD-10-CM | POA: Diagnosis not present

## 2017-06-10 DIAGNOSIS — Z7982 Long term (current) use of aspirin: Secondary | ICD-10-CM | POA: Diagnosis not present

## 2017-06-10 DIAGNOSIS — R1084 Generalized abdominal pain: Secondary | ICD-10-CM

## 2017-06-10 DIAGNOSIS — J9811 Atelectasis: Secondary | ICD-10-CM | POA: Diagnosis not present

## 2017-06-10 DIAGNOSIS — R778 Other specified abnormalities of plasma proteins: Secondary | ICD-10-CM

## 2017-06-10 DIAGNOSIS — I5023 Acute on chronic systolic (congestive) heart failure: Secondary | ICD-10-CM | POA: Diagnosis not present

## 2017-06-10 DIAGNOSIS — K5730 Diverticulosis of large intestine without perforation or abscess without bleeding: Secondary | ICD-10-CM | POA: Diagnosis not present

## 2017-06-10 DIAGNOSIS — Z951 Presence of aortocoronary bypass graft: Secondary | ICD-10-CM | POA: Diagnosis not present

## 2017-06-10 DIAGNOSIS — Z8711 Personal history of peptic ulcer disease: Secondary | ICD-10-CM | POA: Insufficient documentation

## 2017-06-10 DIAGNOSIS — J90 Pleural effusion, not elsewhere classified: Secondary | ICD-10-CM

## 2017-06-10 DIAGNOSIS — I5082 Biventricular heart failure: Secondary | ICD-10-CM | POA: Insufficient documentation

## 2017-06-10 DIAGNOSIS — K760 Fatty (change of) liver, not elsewhere classified: Secondary | ICD-10-CM | POA: Diagnosis not present

## 2017-06-10 DIAGNOSIS — M1990 Unspecified osteoarthritis, unspecified site: Secondary | ICD-10-CM | POA: Diagnosis not present

## 2017-06-10 DIAGNOSIS — R0902 Hypoxemia: Secondary | ICD-10-CM

## 2017-06-10 DIAGNOSIS — R7989 Other specified abnormal findings of blood chemistry: Secondary | ICD-10-CM

## 2017-06-10 MED ORDER — ONDANSETRON HCL 4 MG/2ML IJ SOLN
4.0000 mg | Freq: Once | INTRAMUSCULAR | Status: AC
  Administered 2017-06-11: 4 mg via INTRAVENOUS
  Filled 2017-06-10: qty 2

## 2017-06-10 MED ORDER — FENTANYL CITRATE (PF) 100 MCG/2ML IJ SOLN
25.0000 ug | Freq: Once | INTRAMUSCULAR | Status: AC
  Administered 2017-06-11: 25 ug via INTRAVENOUS
  Filled 2017-06-10: qty 2

## 2017-06-10 MED ORDER — FAMOTIDINE IN NACL 20-0.9 MG/50ML-% IV SOLN
20.0000 mg | Freq: Once | INTRAVENOUS | Status: AC
  Administered 2017-06-11: 20 mg via INTRAVENOUS
  Filled 2017-06-10: qty 50

## 2017-06-10 NOTE — ED Triage Notes (Addendum)
Pt bib [facility] from home d/t 10/10 central abd pain and vomiting starting approx 1030 tonight, denies nausea. Pt states frequent abdominal bloating and cough. Pt reports Flu shot today. Pt VSS in route, placed on [place] d/t comfort for [facility]. EMS temp 101.9 oral. Pt on 2L O2 for comfort

## 2017-06-10 NOTE — ED Notes (Signed)
ED Provider at bedside. 

## 2017-06-10 NOTE — Telephone Encounter (Signed)
Patient has been referred to [facility] program for home visits due to ongoing risk for readmission.  I have sent appropriate referral information via secure email to [facility].

## 2017-06-10 NOTE — ED Notes (Signed)
Family at bedside. 

## 2017-06-10 NOTE — ED Provider Notes (Signed)
[facility] Emergency Department Provider Note   ____________________________________________   First MD Initiated Contact with Patient 06/10/17 2347     (approximate)  I have reviewed the triage vital signs and the nursing notes.   HISTORY  Chief Complaint Abdominal Pain    HPI Carl [name] is a 66 y.o. male brought to the ED from home via EMS with a chief complaint of abdominal pain.patient complains of periumbilical pain starting approximately 10:30 PM. Associated with bloating and 1 episode of emesis. Reports improvement of symptoms without intervention. Also notes nonproductive cough. Received pneumonia and influenza vaccinations today with his PCP. EMS reports oral temp 101.34F; they placed patient on 2 L nasal cannula oxygen for shortness of breath. Patient does not normally wear oxygen at home. No antipyretic given for fever. Patient denies recent fever, chills, chest pain, dysuria, diarrhea. Denies recent travel or trauma.   Past Medical History:  Diagnosis Date  . Arthritis   . Brunner's gland hyperplasia of duodenum   . CHF (congestive heart failure) ([place])   . Coronary artery disease   . External hemorrhoids   . GERD (gastroesophageal reflux disease)   . Hearing loss   . HH (hiatus hernia)   . Internal hemorrhoids   . Ischemic cardiomyopathy   . Leg fracture, left   . Myocardial infarction ([place])   . Paronychia   . Psoriasis   . PUD (peptic ulcer disease)   . Schatzki's ring   . Tubular adenoma of colon   . Vitiligo     Patient Active Problem List   Diagnosis Date Noted  . Prostate cancer screening 04/28/2017  . Hydrocele 04/28/2017  . Bradycardia   . Premature ventricular contraction   . Systolic dysfunction with acute on chronic heart failure ([place])   . Non-rheumatic mitral regurgitation   . Chronic systolic dysfunction of left ventricle 05/01/2015  . Cardiomyopathy, ischemic 04/26/2015  . Difficulty hearing 12/29/2014  .  Acute on chronic systolic CHF (congestive heart failure) ([place]) 10/10/2014  . 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 Acute systolic heart failure ([place]) 08/01/2014  . Protein-calorie malnutrition, severe ([place]) 07/29/2014  . ST elevation myocardial infarction (STEMI) of anterolateral wall ([place]) 07/29/2014  . Cardiogenic shock ([place]) 07/29/2014  . Tobacco use 07/29/2014  . AKI (acute kidney injury) ([place]) 07/29/2014    Past Surgical History:  Procedure Laterality Date  . CARDIAC SURGERY    . CORONARY ARTERY BYPASS GRAFT N/A 08/03/2014   Procedure: CORONARY ARTERY BYPASS GRAFTING (CABG);  Surgeon: [name], MD;  Location: [facility];  Service: Open Heart Surgery;  Laterality: N/A;  . EP IMPLANTABLE DEVICE N/A 05/01/2015   MDT ICD implanted for primary prevention of sudden death  . ESOPHAGOGASTRODUODENOSCOPY (EGD) WITH PROPOFOL N/A 04/16/2015   Procedure: ESOPHAGOGASTRODUODENOSCOPY (EGD) WITH PROPOFOL;  Surgeon: [name], MD;  Location[facility] ENDOSCOPY;  Service: Endoscopy;  Laterality: N/A;  . HERNIA REPAIR    . TEE WITHOUT CARDIOVERSION N/A 08/03/2014   Procedure: TRANSESOPHAGEAL ECHOCARDIOGRAM (TEE);  Surgeon: [name], MD;  Location: [facility];  Service: Open Heart Surgery;  Laterality: N/A;    Prior to Admission medications   Medication Sig Start Date End Date Taking? Authorizing Provider  aspirin 81 MG tablet Take 81 mg by mouth 2 (two) times daily.    [provider]  atorvastatin (LIPITOR) 40 MG tablet Take 1 tablet (40 mg total) by mouth daily at 6 PM. 08/15/14   Gold, Patrick Jupiter E, PA-C  carvedilol (  COREG) 3.125 MG tablet Take 3.125 mg by mouth 2 (two) times daily with a meal.    [provider]  citalopram (CELEXA) 20 MG tablet Take 1 tablet by mouth daily. 07/09/16   [provider]  cyanocobalamin 500 MCG tablet Take 500 mcg by mouth daily.    [provider]  eplerenone (INSPRA) 25 MG tablet Take 1 tablet (25 mg total) by mouth daily. 02/20/17    [name], MD  ferrous sulfate 325 (65 FE) MG tablet Take 325 mg by mouth daily with breakfast.    [provider]  furosemide (LASIX) 40 MG tablet Take 2 tablets (80 mg total) in AM and 1 tab (40 mg) in PM 11/12/16   Bensimhon, Shaune Pascal, MD  losartan (COZAAR) 25 MG tablet Take 0.5 tablets (12.5 mg total) by mouth every evening. 06/01/17 08/30/17  [name], MD  omeprazole (PRILOSEC) 20 MG capsule Take 20 mg by mouth 2 (two) times daily.    [provider]  Potassium Chloride ER 20 MEQ TBCR Take 20 mEq by mouth 2 (two) times daily. 06/08/17   [name], MD    Allergies Spironolactone  Family History  Problem Relation Age of Onset  . Valvular heart disease Mother        Ruptured valve  . CAD Father   . Heart Problems Brother        Stents x 4  . Diabetes Brother   . Prostate cancer Neg Hx   . Bladder Cancer Neg Hx   . Kidney cancer Neg Hx     Social History Social History  Substance Use Topics  . Smoking status: Former Research scientist (life sciences)  . Smokeless tobacco: Never Used  . Alcohol use No    Review of Systems  Constitutional: No fever/chills. Eyes: No visual changes. ENT: No sore throat. Cardiovascular: Denies chest pain. Respiratory: Denies shortness of breath. Gastrointestinal: positive for abdominal pain and vomiting.  No diarrhea.  No constipation. Genitourinary: Negative for dysuria. Musculoskeletal: Negative for back pain. Skin: Negative for rash. Neurological: Negative for headaches, focal weakness or numbness.   ____________________________________________   PHYSICAL EXAM:  VITAL SIGNS: ED Triage Vitals  Enc Vitals Group     BP 06/10/17 2344 108/78     Pulse Rate 06/10/17 2344 78     Resp 06/10/17 2344 (!) 24     Temp 06/10/17 2344 98.3 F (36.8 C)     Temp src --      SpO2 06/10/17 2338 93 %     Weight 06/10/17 2344 183 lb (83 kg)     Height 06/10/17 2344 6' (1.829 m)     Head Circumference --      Peak Flow --      Pain  Score 06/10/17 2343 10     Pain Loc --      Pain Edu? --      Excl. in [place]? --     Constitutional: Alert and oriented. Well appearing and in no acute distress. Eyes: Conjunctivae are normal. PERRL. EOMI. Head: Atraumatic. Nose: No congestion/rhinnorhea. Mouth/Throat: Mucous membranes are moist.  Oropharynx non-erythematous. Neck: No stridor.   Cardiovascular: Normal rate, regular rhythm. Grossly normal heart sounds.  Good peripheral circulation. Respiratory: Normal respiratory effort.  No retractions. Lungs CTAB. Gastrointestinal: Soft and mildly diffusely tender to palpation without rebound or guarding. No distention. No abdominal bruits. No CVA tenderness. Musculoskeletal: No lower extremity tenderness nor edema.  No joint effusions. Neurologic:  Normal speech and  language. No gross focal neurologic deficits are appreciated.  Skin:  Skin is warm, dry and intact. No rash noted. Psychiatric: Mood and affect are normal. Speech and behavior are normal.  ____________________________________________   LABS (all labs ordered are listed, but only abnormal results are displayed)  Labs Reviewed  COMPREHENSIVE METABOLIC PANEL - Abnormal; Notable for the following:       Result Value   BUN 21 (*)    Creatinine, Ser 1.38 (*)    Calcium 8.5 (*)    Total Bilirubin 1.8 (*)    GFR calc non Af Amer 52 (*)    All other components within normal limits  CBC - Abnormal; Notable for the following:    RBC 3.66 (*)    Hemoglobin 12.3 (*)    HCT 36.1 (*)    All other components within normal limits  TROPONIN I - Abnormal; Notable for the following:    Troponin I 0.04 (*)    All other components within normal limits  BRAIN NATRIURETIC PEPTIDE - Abnormal; Notable for the following:    B Natriuretic Peptide 843.0 (*)    All other components within normal limits  LIPASE, BLOOD  URINALYSIS, COMPLETE (UACMP) WITH MICROSCOPIC   ____________________________________________  EKG  ED ECG REPORT I,  Thierry Dobosz J, the attending physician, personally viewed and interpreted this ECG.   Date: 06/11/2017  EKG Time: 2340  Rate: 82  Rhythm: normal EKG, normal sinus rhythm  Axis: normal  Intervals:none  ST&T Change: nonspecific  ____________________________________________  RADIOLOGY  Ct Abdomen Pelvis W Contrast  Result Date: 06/11/2017 CLINICAL DATA:  Abdominal pain and vomiting beginning tonight. Abdominal bloating and cough. Flu shot today. EXAM: CT ABDOMEN AND PELVIS WITH CONTRAST TECHNIQUE: Multidetector CT imaging of the abdomen and pelvis was performed using the standard protocol following bolus administration of intravenous contrast. CONTRAST:  151mL ISOVUE-300 IOPAMIDOL (ISOVUE-300) INJECTION 61% COMPARISON:  10/17/2014 FINDINGS: Lower chest: Bilateral pleural effusions with basilar atelectasis, greater on the right. Cardiac enlargement. Small esophageal hiatal hernia. Hepatobiliary: Mild diffuse fatty infiltration of the liver. Liver configuration suggests cirrhosis. There is nodularity of the contour and enlargement of the lateral segment left lobe. Calcifications in the right lobe of the liver. No focal lesions identified. Gallbladder and bile ducts are unremarkable. Pancreas: Unremarkable. No pancreatic ductal dilatation or surrounding inflammatory changes. Spleen: Normal in size without focal abnormality. Adrenals/Urinary Tract: Adrenal glands are unremarkable. Kidneys are normal, without renal calculi, focal lesion, or hydronephrosis. Bladder is unremarkable. Stomach/Bowel: Stomach, small bowel, and colon are not abnormally distended. Contrast material flows through to the colon without evidence of bowel obstruction. Scattered diverticula in the colon. No inflammatory changes. Mild wall thickening in the sigmoid region likely represents muscular hypertrophy. Appendix is normal. Vascular/Lymphatic: Aortic atherosclerosis. No enlarged abdominal or pelvic lymph nodes. Reproductive: Prostate  is unremarkable. Other: No abdominal wall hernia or abnormality. No abdominopelvic ascites. Musculoskeletal: No acute or significant osseous findings. IMPRESSION: Bilateral pleural effusions with basilar atelectasis, greater on the right. Cardiac enlargement. Mild diffuse fatty infiltration and probable cirrhotic changes in the liver. No evidence of bowel obstruction or inflammation. Aortic atherosclerosis. Electronically Signed   By: Lucienne Capers M.D.   On: 06/11/2017 02:27   Dg Chest Port 1 View  Result Date: 06/11/2017 CLINICAL DATA:  Central abdominal pain and vomiting starting tonight. Cough. Flu shot today. EXAM: PORTABLE CHEST 1 VIEW COMPARISON:  06/10/2016 FINDINGS: Cardiac pacemaker. Postoperative changes in the mediastinum. Cardiac enlargement with pulmonary vascular congestion. Perihilar and basilar interstitial changes  likely representing edema. Suggestion of small bilateral pleural effusions blunting the costophrenic angles. No pneumothorax. IMPRESSION: Cardiac enlargement with mild pulmonary vascular congestion and perihilar interstitial edema. Small bilateral pleural effusions. Changes likely represent congestive failure. Electronically Signed   By: Lucienne Capers M.D.   On: 06/11/2017 01:00    ____________________________________________   PROCEDURES  Procedure(s) performed: None  Procedures  Critical Care performed: No  ____________________________________________   INITIAL IMPRESSION / ASSESSMENT AND PLAN / ED COURSE  As part of my medical decision making, I reviewed the following data within the [place] History obtained from family, Nursing notes reviewed and incorporated, Labs reviewed, EKG interpreted, Radiograph reviewed and Notes from prior ED visits.   66 year old male with multiple medical problems who presents with periumbilical abdominal pain, bloating, cough and emesis 1. Differential diagnosis includes, but is not limited to, biliary  disease (biliary colic, acute cholecystitis, cholangitis, choledocholithiasis, etc), intrathoracic causes for epigastric abdominal pain including ACS, gastritis, duodenitis, pancreatitis, small bowel or large bowel obstruction, abdominal aortic aneurysm, hernia, and gastritis. Will obtain screening lab work, chest x-ray, CT abdomen/pelvis to evaluate for intra-abdominal etiology.  Clinical Course as of Jun 11 330  Thu Jun 11, 2017  0255 Updated patient and sister of laboratory and imaging results. Will discuss with hospitalist to evaluate patient in the emergency department for admission.  [JS]    Clinical Course User Index [JS] Paulette Blanch, MD     ____________________________________________   FINAL CLINICAL IMPRESSION(S) / ED DIAGNOSES  Final diagnoses:  Generalized abdominal pain  SOB (shortness of breath)  Acute on chronic congestive heart failure, unspecified heart failure type ([place])  Hypoxia  Elevated troponin  Chronic bilateral pleural effusions      NEW MEDICATIONS STARTED DURING THIS VISIT:  New Prescriptions   No medications on file     Note:  This document was prepared using Dragon voice recognition software and may include unintentional dictation errors.    Paulette Blanch, MD 06/11/17 [phone]

## 2017-06-11 ENCOUNTER — Emergency Department: Payer: Medicare Other

## 2017-06-11 ENCOUNTER — Ambulatory Visit: Payer: Medicare Other | Admitting: *Deleted

## 2017-06-11 ENCOUNTER — Observation Stay (HOSPITAL_BASED_OUTPATIENT_CLINIC_OR_DEPARTMENT_OTHER)
Admit: 2017-06-11 | Discharge: 2017-06-11 | Disposition: A | Discharge status: Home / Self Care | Payer: Medicare Other | Attending: Physician Assistant | Admitting: Physician Assistant

## 2017-06-11 ENCOUNTER — Telehealth: Payer: Self-pay | Admitting: Licensed Clinical Social Worker

## 2017-06-11 DIAGNOSIS — I2510 Atherosclerotic heart disease of native coronary artery without angina pectoris: Secondary | ICD-10-CM

## 2017-06-11 DIAGNOSIS — I340 Nonrheumatic mitral (valve) insufficiency: Secondary | ICD-10-CM | POA: Diagnosis not present

## 2017-06-11 DIAGNOSIS — R748 Abnormal levels of other serum enzymes: Secondary | ICD-10-CM

## 2017-06-11 DIAGNOSIS — I130 Hypertensive heart and chronic kidney disease with heart failure and stage 1 through stage 4 chronic kidney disease, or unspecified chronic kidney disease: Secondary | ICD-10-CM | POA: Diagnosis not present

## 2017-06-11 DIAGNOSIS — I361 Nonrheumatic tricuspid (valve) insufficiency: Secondary | ICD-10-CM

## 2017-06-11 DIAGNOSIS — I5023 Acute on chronic systolic (congestive) heart failure: Secondary | ICD-10-CM | POA: Diagnosis not present

## 2017-06-11 LAB — COMPREHENSIVE METABOLIC PANEL
ALT: 25 U/L (ref 17–63)
AST: 29 U/L (ref 15–41)
Albumin: 3.9 g/dL (ref 3.5–5.0)
Alkaline Phosphatase: 81 U/L (ref 38–126)
Anion gap: 10 (ref 5–15)
BILIRUBIN TOTAL: 1.8 mg/dL — AB (ref 0.3–1.2)
BUN: 21 mg/dL — ABNORMAL HIGH (ref 6–20)
CHLORIDE: 103 mmol/L (ref 101–111)
CO2: 23 mmol/L (ref 22–32)
CREATININE: 1.38 mg/dL — AB (ref 0.61–1.24)
Calcium: 8.5 mg/dL — ABNORMAL LOW (ref 8.9–10.3)
GFR, EST NON AFRICAN AMERICAN: 52 mL/min — AB (ref >60)
Glucose, Bld: 97 mg/dL (ref 65–99)
POTASSIUM: 3.8 mmol/L (ref 3.5–5.1)
Sodium: 136 mmol/L (ref 135–145)
TOTAL PROTEIN: 7 g/dL (ref 6.5–8.1)

## 2017-06-11 LAB — CBC
HCT: 36.1 % — ABNORMAL LOW (ref 40.0–52.0)
Hemoglobin: 12.3 g/dL — ABNORMAL LOW (ref 13.0–18.0)
MCH: 33.6 pg (ref 26.0–34.0)
MCHC: 34 g/dL (ref 32.0–36.0)
MCV: 98.6 fL (ref 80.0–100.0)
PLATELETS: 157 10*3/uL (ref 150–440)
RBC: 3.66 MIL/uL — AB (ref 4.40–5.90)
RDW: 14 % (ref 11.5–14.5)
WBC: 9.6 10*3/uL (ref 3.8–10.6)

## 2017-06-11 LAB — URINALYSIS, COMPLETE (UACMP) WITH MICROSCOPIC
Bacteria, UA: NONE SEEN
Bilirubin Urine: NEGATIVE
Glucose, UA: NEGATIVE mg/dL
Hgb urine dipstick: NEGATIVE
Ketones, ur: NEGATIVE mg/dL
Leukocytes, UA: NEGATIVE
Nitrite: NEGATIVE
Protein, ur: NEGATIVE mg/dL
Specific Gravity, Urine: 1.014 (ref 1.005–1.030)
Squamous Epithelial / LPF: NONE SEEN
pH: 5 (ref 5.0–8.0)

## 2017-06-11 LAB — ECHOCARDIOGRAM COMPLETE
Height: 72 in
Weight: 2889.6 oz

## 2017-06-11 LAB — HEMOGLOBIN A1C
Hgb A1c MFr Bld: 5.3 % (ref 4.8–5.6)
Mean Plasma Glucose: 105.41 mg/dL

## 2017-06-11 LAB — TROPONIN I
Troponin I: 0.03 ng/mL (ref <0.03)
Troponin I: 0.03 ng/mL (ref <0.03)
Troponin I: 0.04 ng/mL (ref <0.03)

## 2017-06-11 LAB — BRAIN NATRIURETIC PEPTIDE: B NATRIURETIC PEPTIDE 5: 843 pg/mL — AB (ref 0.0–100.0)

## 2017-06-11 LAB — LIPASE, BLOOD: LIPASE: 22 U/L (ref 11–51)

## 2017-06-11 LAB — TSH: TSH: 0.357 u[IU]/mL (ref 0.350–4.500)

## 2017-06-11 MED ORDER — ATORVASTATIN CALCIUM 20 MG PO TABS
40.0000 mg | ORAL_TABLET | Freq: Every day | ORAL | Status: DC
  Administered 2017-06-11: 40 mg via ORAL
  Filled 2017-06-11: qty 2

## 2017-06-11 MED ORDER — FERROUS SULFATE 325 (65 FE) MG PO TABS
325.0000 mg | ORAL_TABLET | Freq: Every day | ORAL | Status: DC
  Administered 2017-06-11 – 2017-06-12 (×2): 325 mg via ORAL
  Filled 2017-06-11 (×2): qty 1

## 2017-06-11 MED ORDER — ACETAMINOPHEN 325 MG PO TABS: 650.0000 mg | ORAL_TABLET | Freq: Four times a day (QID) | ORAL | Status: DC | PRN

## 2017-06-11 MED ORDER — ONDANSETRON HCL 4 MG/2ML IJ SOLN: 4.0000 mg | Freq: Four times a day (QID) | INTRAMUSCULAR | Status: DC | PRN

## 2017-06-11 MED ORDER — FUROSEMIDE 10 MG/ML IJ SOLN
40.0000 mg | Freq: Two times a day (BID) | INTRAMUSCULAR | Status: DC
  Administered 2017-06-11 – 2017-06-12 (×2): 40 mg via INTRAVENOUS
  Filled 2017-06-11 (×2): qty 4

## 2017-06-11 MED ORDER — DOCUSATE SODIUM 100 MG PO CAPS
100.0000 mg | ORAL_CAPSULE | Freq: Two times a day (BID) | ORAL | Status: DC
  Administered 2017-06-11 – 2017-06-12 (×3): 100 mg via ORAL
  Filled 2017-06-11 (×3): qty 1

## 2017-06-11 MED ORDER — FUROSEMIDE 10 MG/ML IJ SOLN
40.0000 mg | Freq: Once | INTRAMUSCULAR | Status: AC
  Administered 2017-06-11: 40 mg via INTRAVENOUS
  Filled 2017-06-11: qty 4

## 2017-06-11 MED ORDER — ACETAMINOPHEN 650 MG RE SUPP
650.0000 mg | Freq: Four times a day (QID) | RECTAL | Status: DC | PRN
Start: 2017-06-11 — End: 2017-06-12

## 2017-06-11 MED ORDER — IOPAMIDOL (ISOVUE-300) INJECTION 61%
100.0000 mL | Freq: Once | INTRAVENOUS | Status: AC | PRN
  Administered 2017-06-11: 100 mL via INTRAVENOUS

## 2017-06-11 MED ORDER — VITAMIN B-12 1000 MCG PO TABS
500.0000 ug | ORAL_TABLET | Freq: Every day | ORAL | Status: DC
  Administered 2017-06-11 – 2017-06-12 (×2): 500 ug via ORAL
  Filled 2017-06-11 (×2): qty 1

## 2017-06-11 MED ORDER — ASPIRIN 81 MG PO CHEW
81.0000 mg | CHEWABLE_TABLET | Freq: Two times a day (BID) | ORAL | Status: DC
  Administered 2017-06-11 – 2017-06-12 (×3): 81 mg via ORAL
  Filled 2017-06-11 (×3): qty 1

## 2017-06-11 MED ORDER — EPLERENONE 25 MG PO TABS
25.0000 mg | ORAL_TABLET | Freq: Every day | ORAL | Status: DC
  Administered 2017-06-11 – 2017-06-12 (×2): 25 mg via ORAL
  Filled 2017-06-11 (×2): qty 1

## 2017-06-11 MED ORDER — CITALOPRAM HYDROBROMIDE 20 MG PO TABS
20.0000 mg | ORAL_TABLET | Freq: Every day | ORAL | Status: DC
  Administered 2017-06-11 – 2017-06-12 (×2): 20 mg via ORAL
  Filled 2017-06-11 (×2): qty 1

## 2017-06-11 MED ORDER — CARVEDILOL 3.125 MG PO TABS
3.1250 mg | ORAL_TABLET | Freq: Two times a day (BID) | ORAL | Status: DC
  Administered 2017-06-12: 3.125 mg via ORAL
  Filled 2017-06-11: qty 1

## 2017-06-11 MED ORDER — PANTOPRAZOLE SODIUM 40 MG PO TBEC
40.0000 mg | DELAYED_RELEASE_TABLET | Freq: Every day | ORAL | Status: DC
  Administered 2017-06-11 – 2017-06-12 (×2): 40 mg via ORAL
  Filled 2017-06-11 (×2): qty 1

## 2017-06-11 MED ORDER — IOPAMIDOL (ISOVUE-300) INJECTION 61%
30.0000 mL | Freq: Once | INTRAVENOUS | Status: AC | PRN
  Administered 2017-06-11: 30 mL via ORAL

## 2017-06-11 MED ORDER — ENOXAPARIN SODIUM 40 MG/0.4ML ~~LOC~~ SOLN
40.0000 mg | SUBCUTANEOUS | Status: DC
  Administered 2017-06-11: 40 mg via SUBCUTANEOUS
  Filled 2017-06-11: qty 0.4

## 2017-06-11 MED ORDER — LOSARTAN POTASSIUM 25 MG PO TABS: 12.5000 mg | ORAL_TABLET | Freq: Every evening | ORAL | Status: DC

## 2017-06-11 MED ORDER — POTASSIUM CHLORIDE CRYS ER 20 MEQ PO TBCR
20.0000 meq | EXTENDED_RELEASE_TABLET | Freq: Two times a day (BID) | ORAL | Status: DC
  Administered 2017-06-11 – 2017-06-12 (×3): 20 meq via ORAL
  Filled 2017-06-11 (×3): qty 1

## 2017-06-11 MED ORDER — ONDANSETRON HCL 4 MG PO TABS: 4.0000 mg | ORAL_TABLET | Freq: Four times a day (QID) | ORAL | Status: DC | PRN

## 2017-06-11 MED ORDER — FUROSEMIDE 40 MG PO TABS: 40.0000 mg | ORAL_TABLET | Freq: Every day | ORAL | Status: DC

## 2017-06-11 MED ORDER — FUROSEMIDE 10 MG/ML IJ SOLN: 40.0000 mg | Freq: Two times a day (BID) | INTRAMUSCULAR | Status: DC

## 2017-06-11 NOTE — Telephone Encounter (Signed)
CSW spoke with Jeannie Fend, RN care manager to inform of [place] referral. Care manager anticipates possible discharge tomorrow. CSW available as needed. Raquel Sarna, [place], [place]

## 2017-06-11 NOTE — ED Notes (Signed)
Pt on room air

## 2017-06-11 NOTE — Consult Note (Signed)
Cardiology Consultation Note  Patient ID: Carl [name], MRN: [id number], DOB/AGE: [birth year] 66 y.o. Admit date: 06/10/2017   Date of Consult: 06/11/2017 Primary Physician: [name], MD Primary Cardiologist: Dr. [name], MD Requesting Physician: Dr. Marcille Blanco, MD  Chief Complaint: Abdominal pain Reason for Consult: Acute on chronic systolic CHF  HPI: Carl [name] is a 66 y.o. male who is being seen today for the evaluation of acute on chronic systolic CHF at the request of Dr. Marcille Blanco, MD. Patient has a h/o CAD s/p 5 vessel CABG in 12/15 with LIMA-LAD, SVG-D1, sequential SVG-OM2/OM3, SVG-PDA, ICM with prior EF 15-20% most recently 30-35% by TTE in 09/2016 s/p MDT ICD, CKD stage III, mild mental retardation, vitiligo, depression, and ACEi-associated cough who presented to [facility] on 10/17 with increase SOB, cough, and abdominal pain.   Patient was admitted to [facility] in 12/15 with dyspnea. 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 LHC showed 3 vessel disease with EF 15%. Echo showed EF 15-20%.  He underwent CABG x 5 as detailed above. It was difficult to wean him off pressors post-op, ending up requiring midodrine short-term. Admitted 10/17 with volume overload and AKI. Diuresed with IV lasix and transitiioned to 40 mg lasix daily. Spiro, dig, lisinopril stopped due to elevated creatinine 2.28. Discharge weight 163 pounds. Most recent echo from 09/2016 showed EF 30-35%, mild LV dilation, rWMAs, moderate MR, mild to moderately decreased RV systolic function. Cardiolite in 09/2016 showed large inferoseptal/inferior/inferolateral infarction with no ischemia. He was most recently seen by the [facility] on 04/28/2017. At that time, he had a recent DUI without EtOH though 2 substances were noted. Weight of 176 pounds. Most recent Optivol reading from 02/2017 showed fluid was below threshold. BP was soft in the mid 13K systolic, with historically noted soft BP, asymptomatic.   Patient presented to  [facility] with periumbilical pain with associated constipation. He was noted to have a mild cough which prompted a CXR which showed mild vascular congestion with small bilateral pleural effusions. He was given IV Lasix 40 mg with documented UOP of 550 mL. Weight down to 180 pounds from 183 at time of admission overnight. Vitals have been stable. BNP 843, troponin 0.04 x 1, WBC 9.6, HGB 12.3, PLT 157, SCr 1.38 (baseline ~ 1.3-1.6), K+ 3.8, lipase 22. CT abdomen showed bilateral pleural effusions with basilar atelectasis with cirrhotic changes in the liver. No acute abdominal process noted.   Patient drinks at least 1 gallon of sweet tea daily and has either sausage or steaks biscuits every morning. He and his family are uncertain what his weights have been at home. He denies missing any of his medications, including Lasix. No chest pain. No orthopnea or early satiety. Notes waxing and waning LE swelling. No complaints this morning.   Past Medical History:  Diagnosis Date  . Arthritis   . Brunner's gland hyperplasia of duodenum   . CHF (congestive heart failure) ([place])   . Coronary artery disease   . External hemorrhoids   . GERD (gastroesophageal reflux disease)   . Hearing loss   . HH (hiatus hernia)   . Internal hemorrhoids   . Ischemic cardiomyopathy   . Leg fracture, left   . Myocardial infarction ([place])   . Paronychia   . Psoriasis   . PUD (peptic ulcer disease)   . Schatzki's ring   . Tubular adenoma of colon   . Vitiligo       Most Recent Cardiac Studies: Echo 09/2016: Study  Conclusions  - Left ventricle: The cavity size was mildly dilated. Wall   thickness was normal. Systolic function was moderately to   severely reduced. The estimated ejection fraction was in the   range of 30% to 35%. Inferior akinesis, basal inferolateral   akinesis, septal severe hypokinesis. Lateral wall relatively   preserved. Features are consistent with a pseudonormal left   ventricular filling pattern,  with concomitant abnormal relaxation   and increased filling pressure (grade 2 diastolic dysfunction).   E/medial e&' > 15 suggests LV end diastolic pressure at least 20   mmHg. - Aortic valve: There was no stenosis. - Mitral valve: There was moderate regurgitation,   posteriorly-directed. Given wall motion abnormalities, suspect   infarct-related MR. - Left atrium: The atrium was moderately dilated. - Right ventricle: The cavity size was normal. Pacer wire or   catheter noted in right ventricle. Systolic function was mildly   to moderately reduced. - Right atrium: The atrium was moderately dilated. - Tricuspid valve: Peak RV-RA gradient (S): 25 mm Hg. - Pulmonary arteries: PA peak pressure: 33 mm Hg (S). - Systemic veins: IVC measured 1.8 cm with < 50% respirophasic   variation, suggesting RA pressure 8 mmHg.  Impressions:  - Mildly dilated LV with EF 30-35%. Wall motion abnormalities as   noted above. Moderate diastolic dysfunction with evidence for   elevated LV filling pressure. Normal RV size with mild-moderately   decreased systolic function. Moderate MR, likely infarct-related.   Surgical History:  Past Surgical History:  Procedure Laterality Date  . CARDIAC SURGERY    . CORONARY ARTERY BYPASS GRAFT N/A 08/03/2014   Procedure: CORONARY ARTERY BYPASS GRAFTING (CABG);  Surgeon: [name], MD;  Location: [place];  Service: Open Heart Surgery;  Laterality: N/A;  . EP IMPLANTABLE DEVICE N/A 05/01/2015   MDT ICD implanted for primary prevention of sudden death  . ESOPHAGOGASTRODUODENOSCOPY (EGD) WITH PROPOFOL N/A 04/16/2015   Procedure: ESOPHAGOGASTRODUODENOSCOPY (EGD) WITH PROPOFOL;  Surgeon: [name], MD;  Location: [facility] ENDOSCOPY;  Service: Endoscopy;  Laterality: N/A;  . HERNIA REPAIR    . TEE WITHOUT CARDIOVERSION N/A 08/03/2014   Procedure: TRANSESOPHAGEAL ECHOCARDIOGRAM (TEE);  Surgeon: [name], MD;  Location: [facility];  Service: Open Heart Surgery;   Laterality: N/A;     Home Meds: Prior to Admission medications   Medication Sig Start Date End Date Taking? Authorizing Provider  atorvastatin (LIPITOR) 40 MG tablet Take 1 tablet (40 mg total) by mouth daily at 6 PM. 08/15/14  Yes Gold, [name], PA-C  carvedilol (COREG) 3.125 MG tablet Take 3.125 mg by mouth 2 (two) times daily with a meal.   Yes [provider]  citalopram (CELEXA) 20 MG tablet Take 1 tablet by mouth daily. 07/09/16  Yes [provider]  eplerenone (INSPRA) 25 MG tablet Take 1 tablet (25 mg total) by mouth daily. 02/20/17  Yes [name], MD  furosemide (LASIX) 40 MG tablet Take 2 tablets (80 mg total) in AM and 1 tab (40 mg) in PM 11/12/16  Yes Bensimhon, Shaune Pascal, MD  losartan (COZAAR) 25 MG tablet Take 0.5 tablets (12.5 mg total) by mouth every evening. 06/01/17 08/30/17 Yes [name], MD  omeprazole (PRILOSEC) 20 MG capsule Take 20 mg by mouth 2 (two) times daily.   Yes [provider]  Potassium Chloride ER 20 MEQ TBCR Take 20 mEq by mouth 2 (two) times daily. 06/08/17  Yes [name], MD  aspirin 81 MG  tablet Take 81 mg by mouth 2 (two) times daily.    [provider]  cyanocobalamin 500 MCG tablet Take 500 mcg by mouth daily.    [provider]  ferrous sulfate 325 (65 FE) MG tablet Take 325 mg by mouth daily with breakfast.    [provider]    Inpatient Medications:  . aspirin  81 mg Oral BID  . atorvastatin  40 mg Oral q1800  . carvedilol  3.125 mg Oral BID WC  . citalopram  20 mg Oral Daily  . docusate sodium  100 mg Oral BID  . enoxaparin (LOVENOX) injection  40 mg Subcutaneous Q24H  . eplerenone  25 mg Oral Daily  . ferrous sulfate  325 mg Oral Q breakfast  . furosemide  40 mg Oral Daily  . losartan  12.5 mg Oral QPM  . pantoprazole  40 mg Oral Daily  . potassium chloride SA  20 mEq Oral BID  . cyanocobalamin  500 mcg Oral Daily     Allergies:  Allergies  Allergen Reactions  .  Spironolactone     gynecomastia     Social History   Social History  . Marital status: Single    Spouse name: N/A  . Number of children: N/A  . Years of education: N/A   Occupational History  . Not on file.   Social History Main Topics  . Smoking status: Former Research scientist (life sciences)  . Smokeless tobacco: Never Used  . Alcohol use No  . Drug use: No  . Sexual activity: No   Other Topics Concern  . Not on file   Social History Narrative   Pt lives in [place] with his mother.   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 Valvular heart disease Mother        Ruptured valve  . CAD Father   . Heart Problems Brother        Stents x 4  . Diabetes Brother   . Prostate cancer Neg Hx   . Bladder Cancer Neg Hx   . Kidney cancer Neg Hx      Review of Systems: Review of Systems  Constitutional: Positive for malaise/fatigue. Negative for chills, diaphoresis, fever and weight loss.  HENT: Negative for congestion.   Eyes: Negative for discharge and redness.  Respiratory: Positive for cough, sputum production and shortness of breath. Negative for hemoptysis and wheezing.   Cardiovascular: Positive for leg swelling. Negative for chest pain, palpitations, orthopnea, claudication and PND.  Gastrointestinal: Positive for abdominal pain and constipation. Negative for blood in stool, diarrhea, heartburn, melena, nausea and vomiting.  Genitourinary: Negative for hematuria.  Musculoskeletal: Negative for falls and myalgias.  Skin: Negative for rash.  Neurological: Positive for weakness. Negative for dizziness, tingling, tremors, sensory change, speech change, focal weakness and loss of consciousness.  Endo/Heme/Allergies: Does not bruise/bleed easily.  Psychiatric/Behavioral: Negative for substance abuse. The patient is not nervous/anxious.   All other systems reviewed and are negative.   Labs:  Recent Labs  06/10/17 2345  TROPONINI 0.04*    Lab Results  Component Value Date   WBC 9.6 06/10/2017   HGB 12.3 (L) 06/10/2017   HCT 36.1 (L) 06/10/2017   MCV 98.6 06/10/2017   PLT 157 06/10/2017    Recent Labs Lab 06/10/17 2345  NA 136  K 3.8  CL 103  CO2 23  BUN 21*  CREATININE 1.38*  CALCIUM 8.5*  PROT 7.0  BILITOT 1.8*  ALKPHOS 81  ALT 25  AST 29  GLUCOSE 97   Lab Results  Component Value Date   CHOL 109 11/07/2014   HDL 40 11/07/2014   LDLCALC 62 11/07/2014   TRIG 34 11/07/2014   No results found for: DDIMER  Radiology/Studies:  Ct Abdomen Pelvis W Contrast  Result Date: 06/11/2017 IMPRESSION: Bilateral pleural effusions with basilar atelectasis, greater on the right. Cardiac enlargement. Mild diffuse fatty infiltration and probable cirrhotic changes in the liver. No evidence of bowel obstruction or inflammation. Aortic atherosclerosis. Electronically Signed   By: Lucienne Capers M.D.   On: 06/11/2017 02:27   Dg Chest Port 1 View  Result Date: 06/11/2017 IMPRESSION: Cardiac enlargement with mild pulmonary vascular congestion and perihilar interstitial edema. Small bilateral pleural effusions. Changes likely represent congestive failure. Electronically Signed   By: Lucienne Capers M.D.   On: 06/11/2017 01:00    EKG: Interpreted by me showed: NSR, 82 bpm, prior anterior infarct, no acute st/t changes Telemetry: Interpreted by me showed: NSR, occasional PACs/PVCs  Weights: Filed Weights   06/10/17 2344 06/11/17 0520  Weight: 183 lb (83 kg) 180 lb 9.6 oz (81.9 kg)     Physical Exam: Blood pressure 98/70, pulse 67, temperature 98.7 F (37.1 C), temperature source Oral, resp. rate 16, height 6' (1.829 m), weight 180 lb 9.6 oz (81.9 kg), SpO2 94 %. Body mass index is 24.49 kg/m. General: Well developed, well nourished, in no acute distress. Head: Normocephalic, atraumatic, sclera non-icteric, no xanthomas, nares are without discharge.  Neck: Negative for carotid bruits. JVD elevated to the angle  of the mandible. Lungs: Bibasilar crackles, right > left. Breathing is unlabored. Heart: RRR with S1 S2. II/VI systolic murmur at the apex, no rubs, or gallops appreciated. Abdomen: Soft, non-tender, mildly distended with hypoactive bowel sounds. No hepatomegaly. No rebound/guarding. No obvious abdominal masses. Msk:  Strength and tone appear normal for age. Extremities: No clubbing or cyanosis. Trace to 1+ pitting LE edema to the bilateral mid shins. Distal pedal pulses are 2+ and equal bilaterally. Neuro: Alert and oriented X 3. No facial asymmetry. No focal deficit. Moves all extremities spontaneously. Psych:  Responds to questions appropriately with a normal affect.    Assessment and Plan:  Active Problems:   Acute on chronic systolic (congestive) heart failure ([place])    1. Acute on chronic systolic CHF/biventricular failure/ICM: -He does appear volume up -Continue IV Lasix 40 mg bid with KCl repletion -At time of discharge, consider changing home Lasix to torsemide  -Continue Coreg, eplerenone, and losartan -Check echo -CHF education, he will need to stop eating biscuits every morning and decrease his PO fluid consumption  -Daily weights and strict Is & Os  2. CAD s/p CABG/elevated troponin: -No symptoms concerning for ischemia -Troponin minimally elevated at 0.04, continue to cycle to trend -No indication for heparin gtt at this time unless there is dynamic trending -ASA -Coreg -Lipitor -Echo as above  3. Abdominal pain: -CT not acute -Possibly related to constipation/distension  -Diurese as above -Constipation per IM  4. Mitral regurgitation: -Check echo   SignedMarcille Blanco [place] Pager: [phone] 06/11/2017, 8:03 AM

## 2017-06-11 NOTE — Progress Notes (Signed)
[place] [facility] [place] NAME: Carl [name]    MR#:  [id number]  DATE OF BIRTH:  24-Nov-1950  SUBJECTIVE:  Poor historian No cp or sob today  REVIEW OF SYSTEMS:   Review of Systems  Constitutional: Negative for chills, fever and weight loss.  HENT: Negative for ear discharge, ear pain and nosebleeds.   Eyes: Negative for blurred vision, pain and discharge.  Respiratory: Positive for shortness of breath. Negative for sputum production, wheezing and stridor.   Cardiovascular: Negative for chest pain, palpitations, orthopnea and PND.  Gastrointestinal: Negative for abdominal pain, diarrhea, nausea and vomiting.  Genitourinary: Negative for frequency and urgency.  Musculoskeletal: Negative for back pain and joint pain.  Neurological: Positive for weakness. Negative for sensory change, speech change and focal weakness.  Psychiatric/Behavioral: Negative for depression and hallucinations. The patient is not nervous/anxious.    Tolerating Diet:yes Tolerating PT:   DRUG ALLERGIES:   Allergies  Allergen Reactions  . Spironolactone     gynecomastia     VITALS:  Blood pressure 98/70, pulse 67, temperature 98.7 F (37.1 C), temperature source Oral, resp. rate 16, height 6' (1.829 m), weight 81.9 kg (180 lb 9.6 oz), SpO2 94 %.  PHYSICAL EXAMINATION:   Physical Exam  GENERAL:  65 y.o.-year-old patient lying in the bed with no acute distress.  EYES: Pupils equal, round, reactive to light and accommodation. No scleral icterus. Extraocular muscles intact.  HEENT: Head atraumatic, normocephalic. Oropharynx and nasopharynx clear.  NECK:  Supple, no jugular venous distention. No thyroid enlargement, no tenderness.  LUNGS: Normal breath sounds bilaterally, no wheezing, rales, rhonchi. No use of accessory muscles of respiration.  CARDIOVASCULAR: S1, S2 normal. No murmurs, rubs, or gallops.  ABDOMEN: Soft, nontender, nondistended. Bowel sounds  present. No organomegaly or mass.  EXTREMITIES: No cyanosis, clubbing or edema b/l.    NEUROLOGIC: Cranial nerves II through XII are intact. No focal Motor or sensory deficits b/l.   PSYCHIATRIC:  patient is alert and oriented x 3.  SKIN: No obvious rash, lesion, or ulcer.   LABORATORY PANEL:  CBC  Recent Labs Lab 06/10/17 2345  WBC 9.6  HGB 12.3*  HCT 36.1*  PLT 157    Chemistries   Recent Labs Lab 06/10/17 2345  NA 136  K 3.8  CL 103  CO2 23  GLUCOSE 97  BUN 21*  CREATININE 1.38*  CALCIUM 8.5*  AST 29  ALT 25  ALKPHOS 81  BILITOT 1.8*   Cardiac Enzymes  Recent Labs Lab 06/11/17 1207  TROPONINI 0.03*   RADIOLOGY:  Ct Abdomen Pelvis W Contrast  Result Date: 06/11/2017 CLINICAL DATA:  Abdominal pain and vomiting beginning tonight. Abdominal bloating and cough. Flu shot today. EXAM: CT ABDOMEN AND PELVIS WITH CONTRAST TECHNIQUE: Multidetector CT imaging of the abdomen and pelvis was performed using the standard protocol following bolus administration of intravenous contrast. CONTRAST:  168mL ISOVUE-300 IOPAMIDOL (ISOVUE-300) INJECTION 61% COMPARISON:  10/17/2014 FINDINGS: Lower chest: Bilateral pleural effusions with basilar atelectasis, greater on the right. Cardiac enlargement. Small esophageal hiatal hernia. Hepatobiliary: Mild diffuse fatty infiltration of the liver. Liver configuration suggests cirrhosis. There is nodularity of the contour and enlargement of the lateral segment left lobe. Calcifications in the right lobe of the liver. No focal lesions identified. Gallbladder and bile ducts are unremarkable. Pancreas: Unremarkable. No pancreatic ductal dilatation or surrounding inflammatory changes. Spleen: Normal in size without focal abnormality. Adrenals/Urinary Tract: Adrenal glands are unremarkable. Kidneys are normal, without renal  calculi, focal lesion, or hydronephrosis. Bladder is unremarkable. Stomach/Bowel: Stomach, small bowel, and colon are not abnormally  distended. Contrast material flows through to the colon without evidence of bowel obstruction. Scattered diverticula in the colon. No inflammatory changes. Mild wall thickening in the sigmoid region likely represents muscular hypertrophy. Appendix is normal. Vascular/Lymphatic: Aortic atherosclerosis. No enlarged abdominal or pelvic lymph nodes. Reproductive: Prostate is unremarkable. Other: No abdominal wall hernia or abnormality. No abdominopelvic ascites. Musculoskeletal: No acute or significant osseous findings. IMPRESSION: Bilateral pleural effusions with basilar atelectasis, greater on the right. Cardiac enlargement. Mild diffuse fatty infiltration and probable cirrhotic changes in the liver. No evidence of bowel obstruction or inflammation. Aortic atherosclerosis. Electronically Signed   By: Lucienne Capers M.D.   On: 06/11/2017 02:27   Dg Chest Port 1 View  Result Date: 06/11/2017 CLINICAL DATA:  Central abdominal pain and vomiting starting tonight. Cough. Flu shot today. EXAM: PORTABLE CHEST 1 VIEW COMPARISON:  06/10/2016 FINDINGS: Cardiac pacemaker. Postoperative changes in the mediastinum. Cardiac enlargement with pulmonary vascular congestion. Perihilar and basilar interstitial changes likely representing edema. Suggestion of small bilateral pleural effusions blunting the costophrenic angles. No pneumothorax. IMPRESSION: Cardiac enlargement with mild pulmonary vascular congestion and perihilar interstitial edema. Small bilateral pleural effusions. Changes likely represent congestive failure. Electronically Signed   By: Lucienne Capers M.D.   On: 06/11/2017 01:00   ASSESSMENT AND PLAN:  Mr Brimage is a 66 year old male with history of ischemic cardiomyopathy, GERD and esophageal dysfunction presents to the emergency department complaining of abdominal pain. The patient reports that the pain seems to be periumbilical. He denies nausea, vomiting, or blood in his stool. He admits that he is  constipated. He also complained of cough  1. CHF: Acute on chronic; systolic.  -Last EF 00-86%. -IV lasix bid -Good UOP so far  2. Abdominal pain: presenting complaint although the patient denies pain at this time. His abdomen is distended perhaps due to constipation. Place patient on  bowel regimen  3. Elevated troponin: The patient has no chest pain. Troponin is level with previous values and likely represents demand ischemia. Nonetheless, continue to follow cardiac biomarkers. Monitor telemetry.  -Cardiology consultation ordered. Continue aspirin  4. CKD: Stage III; improved from previous labs. Avoid nephrotoxic agents.  5. Hypertension: Controlled; continue valsartan and carvedilol  6. Hyperlipidemia: Continue statin therapy  7. Mood disorder: Continue Celexa  8. DVT prophylaxis: Lovenox  Case discussed with Care Management/Social Worker. Management plans discussed with the patient, family and they are in agreement.  CODE STATUS: full  DVT Prophylaxis: lovenox  TOTAL TIME TAKING CARE OF THIS PATIENT: *30* minutes.  >50% time spent on counselling and coordination of care  POSSIBLE D/C IN *1* DAYS, DEPENDING ON CLINICAL CONDITION.  Note: This dictation was prepared with Dragon dictation along with smaller phrase technology. Any transcriptional errors that result from this process are unintentional.  Nickalus Thornsberry M.D on 06/11/2017 at 2:15 PM  Between 7am to 6pm - Pager - [phone]  After 6pm go to [email or web address] - password [email or web address] [facility]  Office  [phone]  CC: Primary care physician; [name], MDPatient ID: Carl [name], male   DOB: [birth year], 66 y.o.   MRN: [id number]

## 2017-06-11 NOTE — ED Notes (Signed)
ED Provider at bedside. 

## 2017-06-11 NOTE — Progress Notes (Signed)
Pt discharged to home via wc.  Instructions  given to pt.  Questions answered.  No distress.  

## 2017-06-11 NOTE — Plan of Care (Signed)
Problem: Education: Goal: Knowledge of [facility] Education information/materials will improve Outcome: Progressing Admitted from ED with abdominal pain, currently pain free. A&O. Skin intact, verified by [employer], telemetry- box [id number].

## 2017-06-11 NOTE — Care Management Obs Status (Signed)
[place] NOTIFICATION   Patient Details  Name: Carl [name] MRN: [id number] Date of Birth: [birth year]   Medicare Observation Status Notification Given:  Yes    Shelbie Ammons, RN 06/11/2017, 11:15 AM

## 2017-06-11 NOTE — Telephone Encounter (Signed)
CSW contacted patient's brother to discuss implementing Paramedicine program.  Paramedic Ray Vipperman available to begin services although patient currently hospitalized at [facility]. CSW explained the program and no charge to patient or insurance for services. Patient's brother grateful for the support as he shares patient's mother recently passed away and she was the primary caregiver for patient. Patient's brother has taken on role of caregiver "but I am just learning all about this". CSW encouraged brother that paramedic program can assist with education and pre fill med box for patient. Brother grateful for the assistance and support. CSW will contact Ray Vipperman from [facility] and inform to follow up with brother for first home visit. CSW also left message with [facility] office for return call to inform of referral as patient is currently hospitalized at [facility]. CSW will continue to coordinate and assist patient through the [employer] program. Raquel Sarna, [place], [place]

## 2017-06-11 NOTE — Progress Notes (Signed)
Nutrition Education Note  RD consulted for nutrition education regarding acute on chronic CHF.  Patient requested I speak with his brother via telephone. Noted patient may have some intellectual limitations per MD note. Patient's brother states their mother passed away 2 weeks ago and she did basically everything for him. They are trying to take care of him now, also hiring a caregiver to work 12 to 15 hours per week.  Patient normally eats a ham or sausage biscuit with fries and sweet tea for breakfast. Brother or sister will take him some food or take him out to get a vegetable plate for lunch and dinner. Brother was very concerned with making sure patient's food is taken care of. Discussed low sodium, low fat cooking. Encouraged him to make half of patient's plate vegetables at all meals followed by one-quarter meat and one quarter starch. Encouraged the intake of beans.   RD will email "Low Sodium Nutrition Therapy" handout from the Academy of [employer]. Reviewed patient's dietary recall. Provided examples on ways to decrease sodium intake in diet. Discouraged intake of processed foods and use of salt shaker. Encouraged fresh fruits and vegetables as well as whole grain sources of carbohydrates to maximize fiber intake.   RD discussed why it is important for patient to adhere to diet recommendations, and emphasized the role of fluids, foods to avoid, and importance of weighing self daily. Teach back method used.  Expect fair compliance.  Body mass index is 24.49 kg/m. Pt meets criteria for normal weight based on current BMI.  Current diet order is heart healthy, patient is consuming approximately 90% of meals at this time. Labs and medications reviewed. No further nutrition interventions warranted at this time. RD contact information provided. If additional nutrition issues arise, please re-consult RD.   Satira Anis. Mailin Coglianese, MS, RD LDN Inpatient Clinical Dietitian Pager  [phone]

## 2017-06-11 NOTE — Progress Notes (Signed)
  Echocardiogram 2D Echocardiogram has been performed.  Darlina Sicilian M 06/11/2017, 11:15 AM

## 2017-06-11 NOTE — Discharge Instructions (Signed)
Do the following things EVERYDAY: °1) Weigh yourself in the morning before breakfast. Write it down and keep it in a log. °2) Take your medicines as prescribed °3) Eat low salt foods--Limit salt (sodium) to 2000 mg per day.  °4) Stay as active as you can everyday °5) Limit all fluids for the day to less than 2 liters °

## 2017-06-11 NOTE — Care Management Note (Addendum)
Case Management Note  Patient Details  Name: Carl [name] MRN: [id number] Date of Birth: 20-Aug-1951  Subjective/Objective:    Admitted to [facility] under observation status with the diagnosis of CHF, lives alone. Sister is [name] [phone]) and brother [name] [phone]).  states he has an appointment with Dr. Ginette Pitman for this week. Prescriptions are filled at [facility] in [facility]. Home Health in the past, doesn't remember name of agency.[facility] in the past,  No home oxygen. No medical equipment in the home.  Takes care of all basic activities f daily living himself, drives. No falls. Good appetite. Family will transport.        Action/Plan: No follow-up needs identified at this time. Received telephone call from [facility] representative in [place]. Will be followed by Heart failure Clinic at [facility] now. "Ray will be seeing [name]."    Expected Discharge Date:                  Expected Discharge Plan:     In-House Referral:     Discharge planning Services     Post Acute Care Choice:    Choice offered to:     DME Arranged:    DME Agency:     HH Arranged:    HH Agency:     Status of Service:     If discussed at [place] of Stay Meetings, dates discussed:    Additional Comments:  Shelbie Ammons, RN MSN CCM Care Management [phone] 06/11/2017, 11:15 AM

## 2017-06-11 NOTE — H&P (Signed)
Carl [name] is an 66 y.o. male.   Chief Complaint: Abdominal pain HPI: the patient with past medical history of ischemic cardiomyopathy, GERD and esophageal dysfunction presents to the emergency department complaining of abdominal pain. The patient reports that the pain seems to be periumbilical. He denies nausea, vomiting, or blood in his stool. He admits that he is constipated. He also complained of cough. Oxygen saturations were within normal limits on room air but reported chronicity of cough warranted xray which showed some interstitial edema. He was given Lasix 40 mg IV in the emergency department. The patient denies chest pain but also is not a good historian. 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 Laboratory evaluation revealed elevated but stable troponin from previous visits. Due to his acute on chronic heart failure the emergency department staff called the hospitalist service for admission.   Past Medical History:  Diagnosis Date  . Arthritis   . Brunner's gland hyperplasia of duodenum   . CHF (congestive heart failure) ([place])   . Coronary artery disease   . External hemorrhoids   . GERD (gastroesophageal reflux disease)   . Hearing loss   . HH (hiatus hernia)   . Internal hemorrhoids   . Ischemic cardiomyopathy   . Leg fracture, left   . Myocardial infarction ([place])   . Paronychia   . Psoriasis   . PUD (peptic ulcer disease)   . Schatzki's ring   . Tubular adenoma of colon   . Vitiligo     Past Surgical History:  Procedure Laterality Date  . CARDIAC SURGERY    . CORONARY ARTERY BYPASS GRAFT N/A 08/03/2014   Procedure: CORONARY ARTERY BYPASS GRAFTING (CABG);  Surgeon: [name], MD;  Location: [place];  Service: Open Heart Surgery;  Laterality: N/A;  . EP IMPLANTABLE DEVICE N/A 05/01/2015   MDT ICD implanted for primary prevention of sudden death  . ESOPHAGOGASTRODUODENOSCOPY (EGD) WITH PROPOFOL N/A 04/16/2015   Procedure: ESOPHAGOGASTRODUODENOSCOPY (EGD) WITH PROPOFOL;   Surgeon: [name], MD;  Location: [facility] ENDOSCOPY;  Service: Endoscopy;  Laterality: N/A;  . HERNIA REPAIR    . TEE WITHOUT CARDIOVERSION N/A 08/03/2014   Procedure: TRANSESOPHAGEAL ECHOCARDIOGRAM (TEE);  Surgeon: [name], MD;  Location: [place];  Service: Open Heart Surgery;  Laterality: N/A;    Family History  Problem Relation Age of Onset  . Valvular heart disease Mother        Ruptured valve  . CAD Father   . Heart Problems Brother        Stents x 4  . Diabetes Brother   . Prostate cancer Neg Hx   . Bladder Cancer Neg Hx   . Kidney cancer Neg Hx    Social History:  reports that he has quit smoking. He has never used smokeless tobacco. He reports that he does not drink alcohol or use drugs.  Allergies:  Allergies  Allergen Reactions  . Spironolactone     gynecomastia     Medications Prior to Admission  Medication Sig Dispense Refill  . atorvastatin (LIPITOR) 40 MG tablet Take 1 tablet (40 mg total) by mouth daily at 6 PM. 30 tablet 1  . carvedilol (COREG) 3.125 MG tablet Take 3.125 mg by mouth 2 (two) times daily with a meal.    . citalopram (CELEXA) 20 MG tablet Take 1 tablet by mouth daily.  5  . eplerenone (INSPRA) 25 MG tablet Take 1 tablet (25 mg total) by mouth daily. 30 tablet 6  . furosemide (LASIX)  40 MG tablet Take 2 tablets (80 mg total) in AM and 1 tab (40 mg) in PM 270 tablet 3  . losartan (COZAAR) 25 MG tablet Take 0.5 tablets (12.5 mg total) by mouth every evening. 15 tablet 6  . omeprazole (PRILOSEC) 20 MG capsule Take 20 mg by mouth 2 (two) times daily.    . Potassium Chloride ER 20 MEQ TBCR Take 20 mEq by mouth 2 (two) times daily. 60 tablet 3  . aspirin 81 MG tablet Take 81 mg by mouth 2 (two) times daily.    . cyanocobalamin 500 MCG tablet Take 500 mcg by mouth daily.    . ferrous sulfate 325 (65 FE) MG tablet Take 325 mg by mouth daily with breakfast.      Results for orders placed or performed during the hospital encounter of 06/10/17  (from the past 48 hour(s))  Lipase, blood     Status: None   Collection Time: 06/10/17 11:45 PM  Result Value Ref Range   Lipase 22 11 - 51 U/L  Comprehensive metabolic panel     Status: Abnormal   Collection Time: 06/10/17 11:45 PM  Result Value Ref Range   Sodium 136 135 - 145 mmol/L   Potassium 3.8 3.5 - 5.1 mmol/L   Chloride 103 101 - 111 mmol/L   CO2 23 22 - 32 mmol/L   Glucose, Bld 97 65 - 99 mg/dL   BUN 21 (H) 6 - 20 mg/dL   Creatinine, Ser 1.38 (H) 0.61 - 1.24 mg/dL   Calcium 8.5 (L) 8.9 - 10.3 mg/dL   Total Protein 7.0 6.5 - 8.1 g/dL   Albumin 3.9 3.5 - 5.0 g/dL   AST 29 15 - 41 U/L   ALT 25 17 - 63 U/L   Alkaline Phosphatase 81 38 - 126 U/L   Total Bilirubin 1.8 (H) 0.3 - 1.2 mg/dL   GFR calc non Af Amer 52 (L) >60 mL/min   GFR calc Af Amer >60 >60 mL/min    Comment: (NOTE) The eGFR has been calculated using the CKD EPI equation. This calculation has not been validated in all clinical situations. eGFR's persistently <60 mL/min signify possible Chronic Kidney Disease.    Anion gap 10 5 - 15  CBC     Status: Abnormal   Collection Time: 06/10/17 11:45 PM  Result Value Ref Range   WBC 9.6 3.8 - 10.6 K/uL   RBC 3.66 (L) 4.40 - 5.90 MIL/uL   Hemoglobin 12.3 (L) 13.0 - 18.0 g/dL   HCT 36.1 (L) 40.0 - 52.0 %   MCV 98.6 80.0 - 100.0 fL   MCH 33.6 26.0 - 34.0 pg   MCHC 34.0 32.0 - 36.0 g/dL   RDW 14.0 11.5 - 14.5 %   Platelets 157 150 - 440 K/uL  Troponin I     Status: Abnormal   Collection Time: 06/10/17 11:45 PM  Result Value Ref Range   Troponin I 0.04 (HH) <0.03 ng/mL    Comment: CRITICAL RESULT CALLED TO, READ BACK BY AND VERIFIED WITH ALLISON PATE AT 0022 06/11/17 ALV   Brain natriuretic peptide     Status: Abnormal   Collection Time: 06/10/17 11:45 PM  Result Value Ref Range   B Natriuretic Peptide 843.0 (H) 0.0 - 100.0 pg/mL   Ct Abdomen Pelvis W Contrast  Result Date: 06/11/2017 CLINICAL DATA:  Abdominal pain and vomiting beginning tonight. Abdominal  bloating and cough. Flu shot today. EXAM: CT ABDOMEN AND PELVIS WITH CONTRAST TECHNIQUE: Multidetector  CT imaging of the abdomen and pelvis was performed using the standard protocol following bolus administration of intravenous contrast. CONTRAST:  159m ISOVUE-300 IOPAMIDOL (ISOVUE-300) INJECTION 61% COMPARISON:  10/17/2014 FINDINGS: Lower chest: Bilateral pleural effusions with basilar atelectasis, greater on the right. Cardiac enlargement. Small esophageal hiatal hernia. Hepatobiliary: Mild diffuse fatty infiltration of the liver. Liver configuration suggests cirrhosis. There is nodularity of the contour and enlargement of the lateral segment left lobe. Calcifications in the right lobe of the liver. No focal lesions identified. Gallbladder and bile ducts are unremarkable. Pancreas: Unremarkable. No pancreatic ductal dilatation or surrounding inflammatory changes. Spleen: Normal in size without focal abnormality. Adrenals/Urinary Tract: Adrenal glands are unremarkable. Kidneys are normal, without renal calculi, focal lesion, or hydronephrosis. Bladder is unremarkable. Stomach/Bowel: Stomach, small bowel, and colon are not abnormally distended. Contrast material flows through to the colon without evidence of bowel obstruction. Scattered diverticula in the colon. No inflammatory changes. Mild wall thickening in the sigmoid region likely represents muscular hypertrophy. Appendix is normal. Vascular/Lymphatic: Aortic atherosclerosis. No enlarged abdominal or pelvic lymph nodes. Reproductive: Prostate is unremarkable. Other: No abdominal wall hernia or abnormality. No abdominopelvic ascites. Musculoskeletal: No acute or significant osseous findings. IMPRESSION: Bilateral pleural effusions with basilar atelectasis, greater on the right. Cardiac enlargement. Mild diffuse fatty infiltration and probable cirrhotic changes in the liver. No evidence of bowel obstruction or inflammation. Aortic atherosclerosis. Electronically  Signed   By: WLucienne CapersM.D.   On: 06/11/2017 02:27   Dg Chest Port 1 View  Result Date: 06/11/2017 CLINICAL DATA:  Central abdominal pain and vomiting starting tonight. Cough. Flu shot today. EXAM: PORTABLE CHEST 1 VIEW COMPARISON:  06/10/2016 FINDINGS: Cardiac pacemaker. Postoperative changes in the mediastinum. Cardiac enlargement with pulmonary vascular congestion. Perihilar and basilar interstitial changes likely representing edema. Suggestion of small bilateral pleural effusions blunting the costophrenic angles. No pneumothorax. IMPRESSION: Cardiac enlargement with mild pulmonary vascular congestion and perihilar interstitial edema. Small bilateral pleural effusions. Changes likely represent congestive failure. Electronically Signed   By: WLucienne CapersM.D.   On: 06/11/2017 01:00    Review of Systems  Constitutional: Negative for chills and fever.  HENT: Negative for sore throat and tinnitus.   Eyes: Negative for blurred vision and redness.  Respiratory: Positive for cough. Negative for shortness of breath.   Cardiovascular: Negative for chest pain, palpitations, orthopnea and PND.  Gastrointestinal: Positive for abdominal pain and constipation. Negative for diarrhea, nausea and vomiting.  Genitourinary: Negative for dysuria, frequency and urgency.  Musculoskeletal: Negative for joint pain and myalgias.  Skin: Negative for rash.       No lesions  Neurological: Negative for speech change, focal weakness and weakness.  Endo/Heme/Allergies: Does not bruise/bleed easily.       No temperature intolerance  Psychiatric/Behavioral: Negative for depression and suicidal ideas.    Blood pressure 110/70, pulse 74, temperature 98.1 F (36.7 C), temperature source Oral, resp. rate 17, height 6' (1.829 m), weight 81.9 kg (180 lb 9.6 oz), SpO2 96 %. Physical Exam  Vitals reviewed. Constitutional: He is oriented to person, place, and time. He appears well-developed and well-nourished. No  distress.  HENT:  Head: Normocephalic and atraumatic.  Mouth/Throat: Oropharynx is clear and moist.  Eyes: Pupils are equal, round, and reactive to light. Conjunctivae and EOM are normal. No scleral icterus.  Neck: Normal range of motion. Neck supple. No JVD present. No tracheal deviation present. No thyromegaly present.  Cardiovascular: Normal rate, regular rhythm and normal heart sounds.  Exam reveals no  gallop and no friction rub.   No murmur heard. Respiratory: Effort normal and breath sounds normal. No respiratory distress.  GI: Soft. Bowel sounds are normal. He exhibits no distension. There is no tenderness.  Genitourinary:  Genitourinary Comments: Deferred  Musculoskeletal: Normal range of motion. He exhibits no edema.  Lymphadenopathy:    He has no cervical adenopathy.  Neurological: He is alert and oriented to person, place, and time. No cranial nerve deficit.  Skin: Skin is warm and dry. No rash noted. No erythema.  Psychiatric: He has a normal mood and affect. His behavior is normal. Judgment and thought content normal.     Assessment/Plan This is a 66 year old male admitted for congestive heart failure. 1. CHF: Acute on chronic; systolic. Last EF 20-25%.cThe patient is received Lasix in the emergency department. I will give another dose of IV Lasix in 6 hours after which we will continue his oral Lasix and potassium sparing diuretic per home regimen. Limit oral fluid intake. 2. Abdominal pain: presenting complaint although the patient denies pain at this time. His abdomen is distended perhaps due to constipation. Place patient on excellent bowel regimen 3. Elevated troponin: The patient has no chest pain. Troponin is level with previous values and likely represents demand ischemia. Nonetheless, continue to follow cardiac biomarkers. Monitor telemetry. Cardiology consultation ordered. Continue aspirin 4. CKD: Stage III; improved from previous labs. Avoid nephrotoxic agents. 5.  Hypertension: Controlled; continue valsartan and carvedilol 6. Hyperlipidemia: Continue statin therapy 7. Mood disorder: Continue Celexa 8. DVT prophylaxis: Lovenox 9. GI prophylaxis: Pantoprazole per home regimen The patient is a full code. Time spent on admission orders and patient care approximately 45 minutes  Harrie Foreman, MD 06/11/2017, 6:28 AM

## 2017-06-11 NOTE — ED Notes (Signed)
Pt up to toilet in room observed ambulation by this RN without complications

## 2017-06-11 NOTE — Plan of Care (Signed)
Problem: Safety: Goal: Ability to remain free from injury will improve Outcome: Progressing Fall precautions in place, non skid socks when oob  Problem: Tissue Perfusion: Goal: Risk factors for ineffective tissue perfusion will decrease Outcome: Progressing SQ Lovenox  Problem: Cardiac: Goal: Ability to achieve and maintain adequate cardiopulmonary perfusion will improve Outcome: Progressing IV Lasix, daily weights, intake and output

## 2017-06-11 NOTE — ED Notes (Signed)
Informed CT pt finished contrast.  

## 2017-06-11 NOTE — Telephone Encounter (Signed)
We have been in communication with pt's brother and pt has been admitted to [facility]

## 2017-06-11 NOTE — ED Notes (Signed)
Patient transported to CT 

## 2017-06-11 NOTE — Progress Notes (Signed)
Patient follows in the AHF Clinic at [facility].  He has been referred to [facility] home visiting program for monitoring due to recent death of his mother (primary caregiver) and his altered mental capacity.  His brother has been contacted and is very much in favor of this plan.  He may also benefit from [facility] for ongoing HF education, symptom recognition and compliance reinforcement.  Please feel free to contact myself  [phone] or Raquel Sarna LSCW [phone] for further information.

## 2017-06-12 ENCOUNTER — Encounter: Payer: Self-pay | Admitting: Cardiology

## 2017-06-12 DIAGNOSIS — I130 Hypertensive heart and chronic kidney disease with heart failure and stage 1 through stage 4 chronic kidney disease, or unspecified chronic kidney disease: Secondary | ICD-10-CM | POA: Diagnosis not present

## 2017-06-12 DIAGNOSIS — R748 Abnormal levels of other serum enzymes: Secondary | ICD-10-CM | POA: Diagnosis not present

## 2017-06-12 DIAGNOSIS — I2510 Atherosclerotic heart disease of native coronary artery without angina pectoris: Secondary | ICD-10-CM | POA: Diagnosis not present

## 2017-06-12 DIAGNOSIS — I5023 Acute on chronic systolic (congestive) heart failure: Secondary | ICD-10-CM | POA: Diagnosis not present

## 2017-06-12 LAB — BASIC METABOLIC PANEL
Anion gap: 7 (ref 5–15)
BUN: 23 mg/dL — AB (ref 6–20)
CALCIUM: 8.4 mg/dL — AB (ref 8.9–10.3)
CO2: 30 mmol/L (ref 22–32)
CREATININE: 1.43 mg/dL — AB (ref 0.61–1.24)
Chloride: 99 mmol/L — ABNORMAL LOW (ref 101–111)
GFR calc Af Amer: 58 mL/min — ABNORMAL LOW (ref >60)
GFR calc non Af Amer: 50 mL/min — ABNORMAL LOW (ref >60)
Glucose, Bld: 102 mg/dL — ABNORMAL HIGH (ref 65–99)
Potassium: 3.4 mmol/L — ABNORMAL LOW (ref 3.5–5.1)
SODIUM: 136 mmol/L (ref 135–145)

## 2017-06-12 MED ORDER — FUROSEMIDE 40 MG PO TABS: 40.0000 mg | ORAL_TABLET | Freq: Two times a day (BID) | ORAL | 1 refills | Status: DC

## 2017-06-12 MED ORDER — POTASSIUM CHLORIDE CRYS ER 20 MEQ PO TBCR: 40.0000 meq | EXTENDED_RELEASE_TABLET | Freq: Once | ORAL | Status: DC

## 2017-06-12 MED ORDER — POTASSIUM CHLORIDE CRYS ER 20 MEQ PO TBCR
20.0000 meq | EXTENDED_RELEASE_TABLET | Freq: Once | ORAL | Status: AC
  Administered 2017-06-12: 20 meq via ORAL
  Filled 2017-06-12: qty 1

## 2017-06-12 MED ORDER — FUROSEMIDE 40 MG PO TABS: 40.0000 mg | ORAL_TABLET | Freq: Two times a day (BID) | ORAL | Status: DC

## 2017-06-12 NOTE — Progress Notes (Signed)
Progress Note  Patient Name: Carl [name] Date of Encounter: 06/12/2017  Primary Cardiologist: Dr. [name]  Subjective    The patient reports improvement in shortness of breath and resolution of abdominal pain and constipation.  He feels close to baseline.  His weight is down 5 pounds.    Inpatient Medications    Scheduled Meds: . aspirin  81 mg Oral BID  . atorvastatin  40 mg Oral q1800  . carvedilol  3.125 mg Oral BID WC  . citalopram  20 mg Oral Daily  . docusate sodium  100 mg Oral BID  . enoxaparin (LOVENOX) injection  40 mg Subcutaneous Q24H  . eplerenone  25 mg Oral Daily  . ferrous sulfate  325 mg Oral Q breakfast  . furosemide  40 mg Oral BID  . losartan  12.5 mg Oral QPM  . pantoprazole  40 mg Oral Daily  . potassium chloride SA  20 mEq Oral BID  . cyanocobalamin  500 mcg Oral Daily   Continuous Infusions:  PRN Meds: acetaminophen **OR** acetaminophen, ondansetron **OR** ondansetron (ZOFRAN) IV   Vital Signs    Vitals:   06/11/17 1701 06/11/17 1944 06/12/17 0533 06/12/17 0835  BP: (!) 96/54 97/62 102/70 104/71  Pulse: 64 71 65 64  Resp:    16  Temp:  98.7 F (37.1 C) 97.8 F (36.6 C) 97.8 F (36.6 C)  TempSrc:  Oral Oral Oral  SpO2:  95% 91% 95%  Weight:   175 lb 9.6 oz (79.7 kg)   Height:        Intake/Output Summary (Last 24 hours) at 06/12/17 0858 Last data filed at 06/12/17 0020  Gross per 24 hour  Intake              960 ml  Output             2100 ml  Net            -1140 ml   Filed Weights   06/10/17 2344 06/11/17 0520 06/12/17 0533  Weight: 183 lb (83 kg) 180 lb 9.6 oz (81.9 kg) 175 lb 9.6 oz (79.7 kg)    Telemetry    Normal sinus rhythm- Personally Reviewed  ECG    I performed- Personally Reviewed  Physical Exam   GEN: No acute distress.   Neck: No JVD Cardiac: RRR, no murmurs, rubs, or gallops.  Respiratory: Clear to auscultation bilaterally. GI: Soft, nontender, non-distended  MS: No edema; No deformity. Neuro:   Nonfocal  Psych: Normal affect   Labs    Chemistry Recent Labs Lab 06/10/17 2345 06/12/17 0432  NA 136 136  K 3.8 3.4*  CL 103 99*  CO2 23 30  GLUCOSE 97 102*  BUN 21* 23*  CREATININE 1.38* 1.43*  CALCIUM 8.5* 8.4*  PROT 7.0  --   ALBUMIN 3.9  --   AST 29  --   ALT 25  --   ALKPHOS 81  --   BILITOT 1.8*  --   GFRNONAA 52* 50*  GFRAA >60 58*  ANIONGAP 10 7     Hematology Recent Labs Lab 06/10/17 2345  WBC 9.6  RBC 3.66*  HGB 12.3*  HCT 36.1*  MCV 98.6  MCH 33.6  MCHC 34.0  RDW 14.0  PLT 157    Cardiac Enzymes Recent Labs Lab 06/10/17 2345 06/11/17 1207 06/11/17 1740  TROPONINI 0.04* 0.03* 0.03*   No results for input(s): TROPIPOC in the last 168 hours.   BNP Recent  Labs Lab 06/10/17 2345  BNP 843.0*     DDimer No results for input(s): DDIMER in the last 168 hours.   Radiology    Ct Abdomen Pelvis W Contrast  Result Date: 06/11/2017 CLINICAL DATA:  Abdominal pain and vomiting beginning tonight. Abdominal bloating and cough. Flu shot today. EXAM: CT ABDOMEN AND PELVIS WITH CONTRAST TECHNIQUE: Multidetector CT imaging of the abdomen and pelvis was performed using the standard protocol following bolus administration of intravenous contrast. CONTRAST:  177mL ISOVUE-300 IOPAMIDOL (ISOVUE-300) INJECTION 61% COMPARISON:  10/17/2014 FINDINGS: Lower chest: Bilateral pleural effusions with basilar atelectasis, greater on the right. Cardiac enlargement. Small esophageal hiatal hernia. Hepatobiliary: Mild diffuse fatty infiltration of the liver. Liver configuration suggests cirrhosis. There is nodularity of the contour and enlargement of the lateral segment left lobe. Calcifications in the right lobe of the liver. No focal lesions identified. Gallbladder and bile ducts are unremarkable. Pancreas: Unremarkable. No pancreatic ductal dilatation or surrounding inflammatory changes. Spleen: Normal in size without focal abnormality. Adrenals/Urinary Tract: Adrenal  glands are unremarkable. Kidneys are normal, without renal calculi, focal lesion, or hydronephrosis. Bladder is unremarkable. Stomach/Bowel: Stomach, small bowel, and colon are not abnormally distended. Contrast material flows through to the colon without evidence of bowel obstruction. Scattered diverticula in the colon. No inflammatory changes. Mild wall thickening in the sigmoid region likely represents muscular hypertrophy. Appendix is normal. Vascular/Lymphatic: Aortic atherosclerosis. No enlarged abdominal or pelvic lymph nodes. Reproductive: Prostate is unremarkable. Other: No abdominal wall hernia or abnormality. No abdominopelvic ascites. Musculoskeletal: No acute or significant osseous findings. IMPRESSION: Bilateral pleural effusions with basilar atelectasis, greater on the right. Cardiac enlargement. Mild diffuse fatty infiltration and probable cirrhotic changes in the liver. No evidence of bowel obstruction or inflammation. Aortic atherosclerosis. Electronically Signed   By: Lucienne Capers M.D.   On: 06/11/2017 02:27   Dg Chest Port 1 View  Result Date: 06/11/2017 CLINICAL DATA:  Central abdominal pain and vomiting starting tonight. Cough. Flu shot today. EXAM: PORTABLE CHEST 1 VIEW COMPARISON:  06/10/2016 FINDINGS: Cardiac pacemaker. Postoperative changes in the mediastinum. Cardiac enlargement with pulmonary vascular congestion. Perihilar and basilar interstitial changes likely representing edema. Suggestion of small bilateral pleural effusions blunting the costophrenic angles. No pneumothorax. IMPRESSION: Cardiac enlargement with mild pulmonary vascular congestion and perihilar interstitial edema. Small bilateral pleural effusions. Changes likely represent congestive failure. Electronically Signed   By: Lucienne Capers M.D.   On: 06/11/2017 01:00    Cardiac Studies   Echo 06/11/2017: Study Conclusions  - Left ventricle: The cavity size was moderately dilated. Wall   thickness was  normal. Systolic function was severely reduced. The   estimated ejection fraction was in the range of 20% to 25%.   Severe hypokinesis of the inferolateral, inferior, and   inferoseptal myocardium. Features are consistent with a   pseudonormal left ventricular filling pattern, with concomitant   abnormal relaxation and increased filling pressure (grade 2   diastolic dysfunction). - Mitral valve: There was severe regurgitation directed   posteriorly. Likely ischemic MR. - Left atrium: The atrium was moderately dilated. - Right ventricle: The cavity size was mildly dilated. Wall   thickness was normal. Systolic function was mildly reduced. - Right atrium: The atrium was mildly dilated. - Pulmonary arteries: Systolic pressure was at the upper limits of   normal. PA peak pressure: 33 mm Hg (S).  Impressions:  - Compared to echo from 09/2016, the EF decreased from 30% and   mitral regurgitation worsened.   Patient Profile  66 y.o. male with known history of chronic systolic heart failure, coronary artery disease status post CABG, mitral regurgitation chronic kidney disease who presented with acute on chronic systolic heart failure with abdominal pain and constipation in addition to shortness of breath.  Assessment & Plan    1.  Acute on chronic systolic heart failure: The patient was volume overloaded on presentation but improved significantly with diuresis.  His lungs are currently clear and he seems to be close to his baseline. I switch furosemide to 40 mg by mouth twice daily.  Continue his other heart failure medications. The patient might be able to go home later in the afternoon if stable. He has a follow-up appointment in the heart failure clinic in [place] on October 26 at 10:00 in the morning. The patient does not follow low-sodium diet very carefully and would benefit from a dietitian consult.  2.  Coronary artery disease without angina: Continue medical therapy.  3.   Severe mitral regurgitation: Likely due to dilated annulus and ischemic MR.  The patient might benefit from evaluation for mitral valve clip.  For questions or updates, please contact [facility] [place] Please consult [email or web address] for contact info under Cardiology/STEMI.      Signed, Kathlyn Sacramento, MD  06/12/2017, 8:58 AM

## 2017-06-12 NOTE — Discharge Summary (Signed)
[facility] NAME: Carl [name]    Carl#:  [id number]  DATE OF BIRTH:  September 03, 1950  DATE OF ADMISSION:  06/10/2017 ADMITTING PHYSICIAN: Harrie Foreman, MD  DATE OF DISCHARGE: 06/12/17  PRIMARY CARE PHYSICIAN: [name], MD    ADMISSION DIAGNOSIS:  SOB (shortness of breath) [R06.02] Generalized abdominal pain [R10.84] Hypoxia [R09.02] Elevated troponin [R74.8] Acute on chronic congestive heart failure, unspecified heart failure type ([place]) [I50.9] Chronic bilateral pleural effusions [J90]  DISCHARGE DIAGNOSIS:  Acute on chronic CHF, systolic Severe CMP with severe Carl  SECONDARY DIAGNOSIS:   Past Medical History:  Diagnosis Date  . Arthritis   . Brunner's gland hyperplasia of duodenum   . CHF (congestive heart failure) ([place])   . Coronary artery disease   . External hemorrhoids   . GERD (gastroesophageal reflux disease)   . Hearing loss   . HH (hiatus hernia)   . Internal hemorrhoids   . Ischemic cardiomyopathy   . Leg fracture, left   . Myocardial infarction ([place])   . Paronychia   . Psoriasis   . PUD (peptic ulcer disease)   . Schatzki's ring   . Tubular adenoma of colon   . Vitiligo     HOSPITAL COURSE:   Carl [name] is a 66 year old male withhistory of ischemic cardiomyopathy, GERD and esophageal dysfunction presents to the emergency department complaining of abdominal pain. The patient reports that the pain seems to be periumbilical. He denies nausea, vomiting, or blood in his stool. He admits that he is constipated. He also complained of cough  1. CHF: Acute on chronic; systolic.  -Last EF 99-37%. -IV lasix bid--change to po lasix 40 mg bid by Dr Fletcher Anon -cont other cardiac meds. F/u CHF clinic in [place] -Good UOP so far  2. Abdominal pain: improved  3. Elevated troponin:  - patient has no chest pain. Troponin is level with previous values and likely represents demand ischemia.  Nonetheless, continue to follow cardiac biomarkers.  -Cardiology consultation appreciated - Continue aspirin  4. CKD: Stage III; improved from previous labs. Avoid nephrotoxic agents.  5. Hypertension: Controlled; continue valsartan and carvedilol  6. Hyperlipidemia: Continue statin therapy  7. Mood disorder: Continue Celexa  8. DVT prophylaxis: Lovenox  Spoke with sister at length D/c home CONSULTS OBTAINED:  Treatment Team:  Wellington Hampshire, MD  DRUG ALLERGIES:   Allergies  Allergen Reactions  . Spironolactone     gynecomastia     DISCHARGE MEDICATIONS:   Current Discharge Medication List    CONTINUE these medications which have CHANGED   Details  furosemide (LASIX) 40 MG tablet Take 1 tablet (40 mg total) by mouth 2 (two) times daily. Qty: 60 tablet, Refills: 1      CONTINUE these medications which have NOT CHANGED   Details  atorvastatin (LIPITOR) 40 MG tablet Take 1 tablet (40 mg total) by mouth daily at 6 PM. Qty: 30 tablet, Refills: 1    carvedilol (COREG) 3.125 MG tablet Take 3.125 mg by mouth 2 (two) times daily with a meal.    citalopram (CELEXA) 20 MG tablet Take 1 tablet by mouth daily. Refills: 5    eplerenone (INSPRA) 25 MG tablet Take 1 tablet (25 mg total) by mouth daily. Qty: 30 tablet, Refills: 6    losartan (COZAAR) 25 MG tablet Take 0.5 tablets (12.5 mg total) by mouth every evening. Qty: 15 tablet, Refills: 6    omeprazole (PRILOSEC) 20 MG capsule  Take 20 mg by mouth 2 (two) times daily.    Potassium Chloride ER 20 MEQ TBCR Take 20 mEq by mouth 2 (two) times daily. Qty: 60 tablet, Refills: 3    aspirin 81 MG tablet Take 81 mg by mouth 2 (two) times daily.    cyanocobalamin 500 MCG tablet Take 500 mcg by mouth daily.    ferrous sulfate 325 (65 FE) MG tablet Take 325 mg by mouth daily with breakfast.        If you experience worsening of your admission symptoms, develop shortness of breath, life threatening emergency,  suicidal or homicidal thoughts you must seek medical attention immediately by calling 911 or calling your MD immediately  if symptoms less severe.  You Must read complete instructions/literature along with all the possible adverse reactions/side effects for all the Medicines you take and that have been prescribed to you. Take any new Medicines after you have completely understood and accept all the possible adverse reactions/side effects.   Please note  You were cared for by a hospitalist during your hospital stay. If you have any questions about your discharge medications or the care you received while you were in the hospital after you are discharged, you can call the unit and asked to speak with the hospitalist on call if the hospitalist that took care of you is not available. Once you are discharged, your primary care physician will handle any further medical issues. Please note that NO REFILLS for any discharge medications will be authorized once you are discharged, as it is imperative that you return to your primary care physician (or establish a relationship with a primary care physician if you do not have one) for your aftercare needs so that they can reassess your need for medications and monitor your lab values. Today   SUBJECTIVE   No new complaints  VITAL SIGNS:  Blood pressure 104/71, pulse 64, temperature 97.8 F (36.6 C), temperature source Oral, resp. rate 16, height 6' (1.829 m), weight 79.7 kg (175 lb 9.6 oz), SpO2 95 %.  I/O:   Intake/Output Summary (Last 24 hours) at 06/12/17 1137 Last data filed at 06/12/17 0955  Gross per 24 hour  Intake              960 ml  Output             1600 ml  Net             -640 ml    PHYSICAL EXAMINATION:  GENERAL:  66 y.o.-year-old patient lying in the bed with no acute distress.  EYES: Pupils equal, round, reactive to light and accommodation. No scleral icterus. Extraocular muscles intact.  HEENT: Head atraumatic, normocephalic.  Oropharynx and nasopharynx clear.  NECK:  Supple, no jugular venous distention. No thyroid enlargement, no tenderness.  LUNGS: Normal breath sounds bilaterally, no wheezing, rales,rhonchi or crepitation. No use of accessory muscles of respiration.  CARDIOVASCULAR: S1, S2 normal. No murmurs, rubs, or gallops.  ABDOMEN: Soft, non-tender, non-distended. Bowel sounds present. No organomegaly or mass.  EXTREMITIES: No pedal edema, cyanosis, or clubbing.  NEUROLOGIC: Cranial nerves II through XII are intact. Muscle strength 5/5 in all extremities. Sensation intact. Gait not checked.  PSYCHIATRIC: The patient is alert and oriented x 3.  SKIN: No obvious rash, lesion, or ulcer.   DATA REVIEW:   CBC   Recent Labs Lab 06/10/17 2345  WBC 9.6  HGB 12.3*  HCT 36.1*  PLT 157    Chemistries  Recent Labs Lab 06/10/17 2345 06/12/17 0432  NA 136 136  K 3.8 3.4*  CL 103 99*  CO2 23 30  GLUCOSE 97 102*  BUN 21* 23*  CREATININE 1.38* 1.43*  CALCIUM 8.5* 8.4*  AST 29  --   ALT 25  --   ALKPHOS 81  --   BILITOT 1.8*  --     Microbiology Results   No results found for this or any previous visit (from the past 240 hour(s)).  RADIOLOGY:  Ct Abdomen Pelvis W Contrast  Result Date: 06/11/2017 CLINICAL DATA:  Abdominal pain and vomiting beginning tonight. Abdominal bloating and cough. Flu shot today. EXAM: CT ABDOMEN AND PELVIS WITH CONTRAST TECHNIQUE: Multidetector CT imaging of the abdomen and pelvis was performed using the standard protocol following bolus administration of intravenous contrast. CONTRAST:  158mL ISOVUE-300 IOPAMIDOL (ISOVUE-300) INJECTION 61% COMPARISON:  10/17/2014 FINDINGS: Lower chest: Bilateral pleural effusions with basilar atelectasis, greater on the right. Cardiac enlargement. Small esophageal hiatal hernia. Hepatobiliary: Mild diffuse fatty infiltration of the liver. Liver configuration suggests cirrhosis. There is nodularity of the contour and enlargement of the  lateral segment left lobe. Calcifications in the right lobe of the liver. No focal lesions identified. Gallbladder and bile ducts are unremarkable. Pancreas: Unremarkable. No pancreatic ductal dilatation or surrounding inflammatory changes. Spleen: Normal in size without focal abnormality. Adrenals/Urinary Tract: Adrenal glands are unremarkable. Kidneys are normal, without renal calculi, focal lesion, or hydronephrosis. Bladder is unremarkable. Stomach/Bowel: Stomach, small bowel, and colon are not abnormally distended. Contrast material flows through to the colon without evidence of bowel obstruction. Scattered diverticula in the colon. No inflammatory changes. Mild wall thickening in the sigmoid region likely represents muscular hypertrophy. Appendix is normal. Vascular/Lymphatic: Aortic atherosclerosis. No enlarged abdominal or pelvic lymph nodes. Reproductive: Prostate is unremarkable. Other: No abdominal wall hernia or abnormality. No abdominopelvic ascites. Musculoskeletal: No acute or significant osseous findings. IMPRESSION: Bilateral pleural effusions with basilar atelectasis, greater on the right. Cardiac enlargement. Mild diffuse fatty infiltration and probable cirrhotic changes in the liver. No evidence of bowel obstruction or inflammation. Aortic atherosclerosis. Electronically Signed   By: Lucienne Capers M.D.   On: 06/11/2017 02:27   Dg Chest Port 1 View  Result Date: 06/11/2017 CLINICAL DATA:  Central abdominal pain and vomiting starting tonight. Cough. Flu shot today. EXAM: PORTABLE CHEST 1 VIEW COMPARISON:  06/10/2016 FINDINGS: Cardiac pacemaker. Postoperative changes in the mediastinum. Cardiac enlargement with pulmonary vascular congestion. Perihilar and basilar interstitial changes likely representing edema. Suggestion of small bilateral pleural effusions blunting the costophrenic angles. No pneumothorax. IMPRESSION: Cardiac enlargement with mild pulmonary vascular congestion and perihilar  interstitial edema. Small bilateral pleural effusions. Changes likely represent congestive failure. Electronically Signed   By: Lucienne Capers M.D.   On: 06/11/2017 01:00     Management plans discussed with the patient, family and they are in agreement.  CODE STATUS:     Code Status Orders        Start     Ordered   06/11/17 0539  Full code  Continuous     06/11/17 0538    Code Status History    Date Active Date Inactive Code Status Order ID Comments User Context   06/10/2016  7:16 PM 06/12/2016 11:40 PM Full Code [id number]  Maryellen Pile, MD Inpatient   05/01/2015  3:14 PM 05/02/2015  3:48 PM Full Code [id number]  Thompson Grayer, MD Inpatient   08/03/2014  1:32 PM 08/17/2014  4:36 PM Full Code [id number]  Girtha Rm,  Etheleen Mayhew Inpatient   07/27/2014  4:46 PM 08/03/2014  1:32 PM Full Code [id number]  Erlene Quan, PA-C Inpatient      TOTAL TIME TAKING CARE OF THIS PATIENT: *40* minutes.    Susanna Benge M.D on 06/12/2017 at 11:37 AM  Between 7am to 6pm - Pager - [phone] After 6pm go to [email or web address] - password [facility]  Office  [phone]  CC: Primary care physician; [name], MD

## 2017-06-12 NOTE — Care Management (Signed)
Will be followed by [facility] in [place] per [place] CHF clinic coordinator. Will be seeing [facility] here. Discharge to home today per Dr. Reginia Forts RN MSN CCM Care Management [phone]

## 2017-06-12 NOTE — Progress Notes (Signed)
IV and tele removed from patient. Discharge instructions given to patient and patients sister. Verbalized understanding. No distress at this time. Sister at bedside will transport patient home.

## 2017-06-12 NOTE — Progress Notes (Signed)
Spoke with patient sister just prior to pt discharging. Provided her with the Living Better with Heart Failure booklet.  Discussed low sodium diet and provided handout Discussed fluid restriction Dicussed diuretic and timing of second dose Discussed daily weights and provided chart to record weights Informed to call with a daily weight >2lb over night or 5lb in a week Talked about signs of worsening heart failure and when to call cardiologist Confirmed that someone will be able to bring him to appointment on 10/26 @ heart failure clinic in [place].  Gave packet to review and encouraged her to share with her brother If she had questions-encouraged her to ask pharmacist or MD at [facility] Case manager consult for home health RN was placed this afternoon by Dr. Posey Pronto.  Ramond Dial, Pharm.D, BCPS Clinical Pharmacist

## 2017-06-15 ENCOUNTER — Ambulatory Visit (INDEPENDENT_AMBULATORY_CARE_PROVIDER_SITE_OTHER): Payer: Medicare Other | Admitting: *Deleted

## 2017-06-15 DIAGNOSIS — I255 Ischemic cardiomyopathy: Secondary | ICD-10-CM

## 2017-06-15 NOTE — Progress Notes (Signed)
Remote ICD transmission.   

## 2017-06-16 ENCOUNTER — Other Ambulatory Visit: Payer: Self-pay | Admitting: Cardiology

## 2017-06-17 LAB — CUP PACEART REMOTE DEVICE CHECK
Brady Statistic RV Percent Paced: 0.06 %
Date Time Interrogation Session: 20181019214356
HIGH POWER IMPEDANCE MEASURED VALUE: 71 Ohm
Implantable Lead Implant Date: 20160906
Implantable Pulse Generator Implant Date: 20160906
Lead Channel Impedance Value: 342 Ohm
Lead Channel Impedance Value: 399 Ohm
Lead Channel Sensing Intrinsic Amplitude: 7.625 mV
MDC IDC LEAD LOCATION: 753860
MDC IDC MSMT BATTERY REMAINING LONGEVITY: 124 mo
MDC IDC MSMT BATTERY VOLTAGE: 3.03 V
MDC IDC MSMT LEADCHNL RV PACING THRESHOLD AMPLITUDE: 0.625 V
MDC IDC MSMT LEADCHNL RV PACING THRESHOLD PULSEWIDTH: 0.4 ms
MDC IDC MSMT LEADCHNL RV SENSING INTR AMPL: 7.625 mV
MDC IDC SET LEADCHNL RV PACING AMPLITUDE: 2.5 V
MDC IDC SET LEADCHNL RV PACING PULSEWIDTH: 0.4 ms
MDC IDC SET LEADCHNL RV SENSING SENSITIVITY: 0.3 mV

## 2017-06-18 ENCOUNTER — Ambulatory Visit: Payer: Medicare Other | Admitting: Family

## 2017-06-18 DIAGNOSIS — F411 Generalized anxiety disorder: Secondary | ICD-10-CM | POA: Insufficient documentation

## 2017-06-19 ENCOUNTER — Encounter: Payer: Self-pay | Admitting: Cardiology

## 2017-06-19 ENCOUNTER — Ambulatory Visit (HOSPITAL_COMMUNITY)
Admission: RE | Admit: 2017-06-19 | Discharge: 2017-06-19 | Disposition: A | Discharge status: Home / Self Care | Payer: Medicare Other | Source: Ambulatory Visit | Attending: Internal Medicine | Admitting: Internal Medicine

## 2017-06-19 ENCOUNTER — Telehealth: Payer: Self-pay | Admitting: Cardiology

## 2017-06-19 VITALS — BP 108/58 | HR 58 | Wt 169.2 lb

## 2017-06-19 DIAGNOSIS — I340 Nonrheumatic mitral (valve) insufficiency: Secondary | ICD-10-CM

## 2017-06-19 DIAGNOSIS — E785 Hyperlipidemia, unspecified: Secondary | ICD-10-CM | POA: Diagnosis not present

## 2017-06-19 DIAGNOSIS — Z7982 Long term (current) use of aspirin: Secondary | ICD-10-CM | POA: Insufficient documentation

## 2017-06-19 DIAGNOSIS — Z951 Presence of aortocoronary bypass graft: Secondary | ICD-10-CM | POA: Diagnosis not present

## 2017-06-19 DIAGNOSIS — I519 Heart disease, unspecified: Secondary | ICD-10-CM

## 2017-06-19 DIAGNOSIS — Z79899 Other long term (current) drug therapy: Secondary | ICD-10-CM | POA: Insufficient documentation

## 2017-06-19 DIAGNOSIS — Z95810 Presence of automatic (implantable) cardiac defibrillator: Secondary | ICD-10-CM

## 2017-06-19 DIAGNOSIS — N182 Chronic kidney disease, stage 2 (mild): Secondary | ICD-10-CM | POA: Diagnosis not present

## 2017-06-19 DIAGNOSIS — I5022 Chronic systolic (congestive) heart failure: Secondary | ICD-10-CM | POA: Diagnosis not present

## 2017-06-19 DIAGNOSIS — F70 Mild intellectual disabilities: Secondary | ICD-10-CM | POA: Diagnosis not present

## 2017-06-19 DIAGNOSIS — I2510 Atherosclerotic heart disease of native coronary artery without angina pectoris: Secondary | ICD-10-CM | POA: Diagnosis not present

## 2017-06-19 DIAGNOSIS — Z87891 Personal history of nicotine dependence: Secondary | ICD-10-CM | POA: Insufficient documentation

## 2017-06-19 DIAGNOSIS — N6310 Unspecified lump in the right breast, unspecified quadrant: Secondary | ICD-10-CM | POA: Diagnosis not present

## 2017-06-19 DIAGNOSIS — N183 Chronic kidney disease, stage 3 (moderate): Secondary | ICD-10-CM | POA: Insufficient documentation

## 2017-06-19 DIAGNOSIS — F329 Major depressive disorder, single episode, unspecified: Secondary | ICD-10-CM | POA: Diagnosis not present

## 2017-06-19 DIAGNOSIS — I255 Ischemic cardiomyopathy: Secondary | ICD-10-CM | POA: Diagnosis not present

## 2017-06-19 LAB — BASIC METABOLIC PANEL
Anion gap: 8 (ref 5–15)
BUN: 17 mg/dL (ref 6–20)
CALCIUM: 9.4 mg/dL (ref 8.9–10.3)
CO2: 30 mmol/L (ref 22–32)
CREATININE: 1.6 mg/dL — AB (ref 0.61–1.24)
Chloride: 97 mmol/L — ABNORMAL LOW (ref 101–111)
GFR calc Af Amer: 51 mL/min — ABNORMAL LOW (ref >60)
GFR, EST NON AFRICAN AMERICAN: 44 mL/min — AB (ref >60)
Glucose, Bld: 94 mg/dL (ref 65–99)
Potassium: 4.8 mmol/L (ref 3.5–5.1)
SODIUM: 135 mmol/L (ref 135–145)

## 2017-06-19 MED ORDER — EPLERENONE 25 MG PO TABS: 25.0000 mg | ORAL_TABLET | Freq: Every day | ORAL | 6 refills | Status: DC

## 2017-06-19 NOTE — Progress Notes (Signed)
Patient ID: Carl [name], male   DOB: 1951-03-15, 66 y.o.   MRN: [id number] PCP: Dr. Ginette Pitman Cardiology: Dr. [name]  66 yo with history of smoking and mild mental retardation was admitted to [facility] in 12/15 with dyspnea.  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  LHC showed 3 vessel disease with EF 15%.  Echo showed EF 15-20%.  Patient had CABG x 5.  It was difficult to wean him off pressors post-op.  He ended up having to start midodrine.  This was weaned off.  Admitted 10/17 with volume overload and AKI. Diuresed with IV lasix and transitiioned to 40 mg lasix daily. Spiro, dig, lisinopril stopped due to elevated creatinine 2.28. Discharge weight 163 pounds.    Echo in 2/18 showed EF 30-35%, mild LV dilation, rWMAs, moderate MR, mild to moderately decreased RV systolic function. Cardiolite in 2/18 showed large inferoseptal/inferior/inferolateral infarction with no ischemia.   Admitted to [facility] 06/10/17-06/12/17 with acute on chronic systolic CHF. Diuresed 5 pounds with IV Lasix. Discharge weight was 175 pounds.   Returns today for HF follow up. Feeling well overall. Denies SOB with walking into clinic, using one pillow. Weights at home 176 - 177 pounds. Taking all medications. Drinking less than 2L a day, eating a low sodium diet. His sister is taking care of him as his mother died recently, she is cooking his meals. Denies chest pain, palpitations.     Labs (12/15): K 4.1, creatinine 0.81, hgb 9.1 Labs (09/12/2014): K 3.7 Creatinine 0.96, digoxin 0.7 Labs (3/16): digoxin 0.8, LDL 62, HDL 40 Labs (4/16): K 4.3, creatinine 1.1 Labs (5/16) K 4.6, creatinine 1.24, digoxin 1.0 Labs (05/2016): K 3.6 Creatinine 1.47  => 1.37 Labs (11/17): LDL 61, HDL 41, K 4.2, creaitnine 1.1 Labs (2/18): K 3.7 => 5.3, creatinine 1.84 => 1.35, BNP 924 Labs (3/18): K 4.8, creatinine 1.33, BNP 552 Labs (9/18): K 3.9, creatinine 1.97  PMH: 1. Smoker 2. Mild mental retardation 3. CAD: LHC (12/15) with 3  vessel disease.  CABG x 5 in 12/15 with LIMA-LAD, SVG-D1, sequential SVG-OM2/OM3, SVG-PDA.   - Cardiolite (2/18):  Large inferoseptal/inferior/inferolateral infarction with no ischemia, EF 29%.  4. Ischemic cardiomyopathy: Echo (12/15) with EF 15-20%, wall motion abnormalities, mildly decreased RV systolic function, mild MR.  Echo (3/16) with EF 30-35%, severe LV dilation, moderate MR, PA systolic pressure 42 mmHg. Echo (8/16) with EF 25-30%, severely dilated LV, diffuse hypokinesis with inferior akinesis, restrictive diastolic function, RV mildly dilated with mildly decreased systolic function, moderate MR.  - Echo (10/17): EF 20-25%, moderate MR.  - Medtronic ICD.  - ACEI cough.  - Echo (2/18): EF 30-35%, mild LV dilation, regional WMAs, moderate diastolic dysfunction, normal RV size with mild to moderately decreased systolic function, moderate MR (likely infarct-related).  5. Depression 6. Vitiligo 7. Mitral regurgitation: Moderate on 2/18, likely infarct-related.  8. CKD stage III.   SH: Lives with mother, quit smoking in 12/15.  No ETOH.   FH: Brother and father with MIs.    ROS: All systems reviewed and negative except as per HPI.   Current Outpatient Prescriptions  Medication Sig Dispense Refill  . aspirin 81 MG tablet Take 81 mg by mouth 2 (two) times daily.    Marland Kitchen atorvastatin (LIPITOR) 40 MG tablet Take 1 tablet (40 mg total) by mouth daily at 6 PM. 30 tablet 1  . carvedilol (COREG) 3.125 MG tablet Take 3.125 mg by mouth 2 (two) times daily with a  meal.    . citalopram (CELEXA) 20 MG tablet Take 1 tablet by mouth daily.  5  . cyanocobalamin 500 MCG tablet Take 500 mcg by mouth daily.    . ferrous sulfate 325 (65 FE) MG tablet Take 325 mg by mouth daily with breakfast.    . furosemide (LASIX) 40 MG tablet Take 40 mg by mouth. 80 mg in the AM and 40 mg in the PM    . losartan (COZAAR) 25 MG tablet Take 25 mg by mouth daily.    . meloxicam (MOBIC) 7.5 MG tablet Take 7.5 mg by mouth  daily.    Marland Kitchen omeprazole (PRILOSEC) 20 MG capsule Take 20 mg by mouth 2 (two) times daily.    Marland Kitchen spironolactone (ALDACTONE) 25 MG tablet Take 25 mg by mouth daily.     No current facility-administered medications for this encounter.    BP (!) 108/58 (BP Location: Left Arm, Patient Position: Sitting, Cuff Size: Normal)   Pulse (!) 58   Wt 169 lb 3.2 oz (76.7 kg)   SpO2 95%   BMI 22.95 kg/m   Filed Weights   06/19/17 1023  Weight: 169 lb 3.2 oz (76.7 kg)    General: Well appearing. No resp difficulty. HEENT: Normal Neck: Supple. JVP 8-9 cm.  Carotids 2+ bilat; no bruits. No thyromegaly or nodule noted. Cor: PMI nondisplaced. RRR, No M/G/R noted Lungs: CTAB, normal effort. Abdomen: Soft, non-tender, non-distended, no HSM. No bruits or masses. +BS  Extremities: No cyanosis, clubbing, or rash. R and LLE no edema.  Neuro: Alert & orientedx3, cranial nerves grossly intact. moves all 4 extremities w/o difficulty. Affect pleasant   Assessment/Plan: 1. CAD: status post CABG.  Cardiolite in 2/18 showed infarction, no ischemia.   - Denies chest pain.  - Continue ASA and statin.    2. Chronic systolic CHF: Ischemic CMP.  Echo 2/18 with EF 30-35%, mild to moderate RV dysfunction.  Has Medtronic ICD. Optivol fluid below threshold. Trending down.  - NYHA II - Volume stable on exam. Continue Lasix 80 mg in the am and 40 mg in the pm.  - BP is chronically soft. Continue current dose of losartan 25 mg daily and Coreg 3.125 mg BID - He is currently taking Arlyce Harman, this was added at his recent hospitalization, however it caused gynecomastia. Will stop this and restart his eplerenone 25 mg daily.  - BMET today.   3. Smoking:  - No longer smoking.   4. Depression:  - Continue Celexa  5. Hyperlipidemia:  - Continue statin.       6. Mitral regurgitation: - Likely infarct related MR. - Stable by last Echo.   7. CKD:  - BMET today.   8. Breast mass: - Mammogram confirmed gynecomastia -  Stopping Arlyce Harman as above.   BMET today. Follow up in 3 months.   Arbutus Leas 06/19/2017

## 2017-06-19 NOTE — Patient Instructions (Signed)
STOP spironolactone.  START Eplerenone (Inspra) 25 mg tablet once daily.  Routine lab work today. Will notify you of abnormal results, otherwise no news is good news!  Follow up 3 months.  Take all medication as prescribed the day of your appointment. Bring all medications with you to your appointment.  Do the following things EVERYDAY: 1) Weigh yourself in the morning before breakfast. Write it down and keep it in a log. 2) Take your medicines as prescribed 3) Eat low salt foods-Limit salt (sodium) to 2000 mg per day.  4) Stay as active as you can everyday 5) Limit all fluids for the day to less than 2 liters

## 2017-06-19 NOTE — Telephone Encounter (Signed)
Returned page to patient's brother Carl [name] who had questions about patient's medications, specifically the Arlyce Harman and Eplerenone. I advised that the patient should not be on Spiro. He should be taking eplerenone. Also asked if patient should be taking KCl supplementation, I advised that he should not be taking KCl.   Arbutus Leas 7:29 PM

## 2017-06-22 ENCOUNTER — Ambulatory Visit (INDEPENDENT_AMBULATORY_CARE_PROVIDER_SITE_OTHER): Payer: Medicare Other

## 2017-06-22 DIAGNOSIS — Z95810 Presence of automatic (implantable) cardiac defibrillator: Secondary | ICD-10-CM

## 2017-06-22 DIAGNOSIS — I5022 Chronic systolic (congestive) heart failure: Secondary | ICD-10-CM | POA: Diagnosis not present

## 2017-06-22 NOTE — Progress Notes (Signed)
EPIC Encounter for ICM Monitoring  Patient Name: Carl [name] is a 66 y.o. male Date: 06/22/2017 Primary Care Physican: [name], MD Primary Cardiologist:[place] Electrophysiologist: Allred DryWeight:unknown       Spoke with brother (DPR) who is now patient's caregiver due to patient's mother died in the past month.  Patient's care is being managed by his brother at this time but he does not live with the brother. He is currently staying by himself. Brother denied patient has any HF symptoms at this time.     Thoracic impedance normal, above baseline indicating dryness which correlates being diuresed during hospitalization 10/17 through 10/19.  Prescribed dosage: Furosemide 40 mg 2 tablets (80 mg total) in AM and 1 tab (40 mg) in PM. Potassium 20 mEq 1 tablet twice a day.   Recommendations: No changes.  Discussed diet and fluid limitations and patient should limiting salt intake to 2000 mg daily and fluid 64 oz daily.  He is aware of HF symptoms and encouraged to call for fluid symptoms.  Provided ICM direct number.  Follow-up plan: ICM clinic phone appointment on 07/09/2017 to recheck fluid levels.    Copy of ICM check sent to Dr. Rayann Heman.   3 month ICM trend: 06/22/2017   1 Year ICM trend:      Rosalene Billings, RN 06/22/2017 12:00 PM

## 2017-06-24 ENCOUNTER — Other Ambulatory Visit: Payer: Self-pay | Admitting: Cardiology

## 2017-06-24 ENCOUNTER — Telehealth (HOSPITAL_COMMUNITY): Payer: Self-pay | Admitting: Surgery

## 2017-06-24 NOTE — Telephone Encounter (Signed)
Ray Vipperman with [employer] program met with Mr Karwowski at his home.  He says that patient and his brother are requesting Cardiac rehab referral.   I will find out if this is possible and contact them if so.

## 2017-06-25 NOTE — Telephone Encounter (Signed)
May refer to [facility] cardiac rehab.

## 2017-06-26 ENCOUNTER — Telehealth (HOSPITAL_COMMUNITY): Payer: Self-pay | Admitting: *Deleted

## 2017-06-26 NOTE — Telephone Encounter (Signed)
Cardiac rehab referral placed with [facility]

## 2017-06-26 NOTE — Telephone Encounter (Signed)
Referral placed.

## 2017-06-30 ENCOUNTER — Telehealth (HOSPITAL_COMMUNITY): Payer: Self-pay | Admitting: Surgery

## 2017-06-30 ENCOUNTER — Other Ambulatory Visit (HOSPITAL_COMMUNITY): Payer: Self-pay | Admitting: Cardiology

## 2017-06-30 NOTE — Telephone Encounter (Signed)
I called to inform patient and Ray Product manager of patients referral to [facility] Cardiac Rehab program.

## 2017-07-02 ENCOUNTER — Encounter: Payer: Self-pay | Admitting: Podiatry

## 2017-07-02 ENCOUNTER — Ambulatory Visit (INDEPENDENT_AMBULATORY_CARE_PROVIDER_SITE_OTHER): Payer: Medicare Other | Admitting: Podiatry

## 2017-07-02 ENCOUNTER — Ambulatory Visit: Payer: Self-pay

## 2017-07-02 ENCOUNTER — Ambulatory Visit: Payer: Medicare Other | Admitting: Podiatry

## 2017-07-02 VITALS — BP 107/72 | HR 70

## 2017-07-02 DIAGNOSIS — M79674 Pain in right toe(s): Secondary | ICD-10-CM

## 2017-07-02 DIAGNOSIS — M79675 Pain in left toe(s): Secondary | ICD-10-CM

## 2017-07-02 DIAGNOSIS — B351 Tinea unguium: Secondary | ICD-10-CM

## 2017-07-02 DIAGNOSIS — R52 Pain, unspecified: Secondary | ICD-10-CM | POA: Diagnosis not present

## 2017-07-02 NOTE — Progress Notes (Signed)
   Subjective:    Patient ID: Carl [name], male    DOB: 1950-09-28, 66 y.o.   MRN: [id number]  HPIthis patient presents the office for 2 major concerns.  He says that his left leg is shorter than the right leg and therefore is interested in picking up and ordering new shoes  to accomodate  his limb length discrepancy.  He says that he fractured his bone in his upper leg, which caused the limb length to occur He also says that his nails are significantly painful  on both feet.  He says his nails are painful walking and wearing his shoes.  He presents the office for treatment of his nails and discussion of new shoes due to his limb length discrepancy.    Review of Systems  All other systems reviewed and are negative.      Objective:   Physical Exam General Appearance  Alert, conversant and in no acute stress.  Vascular  Dorsalis pedis and posterior pulses are palpable  bilaterally.  Capillary return is within normal limits  Bilaterally. Temperature is within normal limits  Bilaterally  Neurologic  Senn-Weinstein monofilament wire test within normal limits  bilaterally. Muscle power  Within normal limits bilaterally.  Nails Thick disfigured discolored nails with subungual debride bilaterally from hallux to fifth toes bilaterally especially his second toenails both feet. No evidence of bacterial infection or drainage bilaterally.  Orthopedic  No limitations of motion of motion feet bilaterally.  No crepitus or effusions noted.  No bony pathology or digital deformities noted. There is an approximate 1-1/2 inch discrepancy between his legs.    Skin  normotropic skin with no porokeratosis noted bilaterally.  No signs of infections or ulcers noted.          Assessment & Plan:  Onychomycosis  B/L  Limb length left feg.  IE  Debridement of nails  X 10.  Called the. Pedorthist and  to ask him how to handle this situation.  Liliane Channel told me to write an prescription for shoes from [place].  The son  was not pleased with this path of treatment.  He says that this shoes that his father wears breaks down every 3 months and caused him money to correct.  He was hoping for a better suggestion for obtaining his new footgear.  I told this patient to return to the office for his nails in 3 months.   Gardiner Barefoot DPM   Of treatment

## 2017-07-04 ENCOUNTER — Other Ambulatory Visit: Payer: Self-pay | Admitting: Nurse Practitioner

## 2017-07-04 MED ORDER — LOSARTAN POTASSIUM 25 MG PO TABS: 12.5000 mg | ORAL_TABLET | Freq: Every day | ORAL | Status: DC

## 2017-07-04 NOTE — Progress Notes (Signed)
   Pts family member called with question re: losartan dose.  Paperwork says 25mg  daily however bottle from pharmacy says 25 mg, 1/2 tab daily, #15 dispensed.  Note from 10/26 indicated 25 mg daily.  Looks like most recent med update was @ that visit.  Pts family member just picked up Rx yesterday.  It's unclear to me where the discrepancy comes from.  Regardless, pt has been having dizziness @ home and has chronically low bp.  I rec that given dizziness with low BPs, he can reduce losartan to 1/2 tab (12.5 mg) daily.  Home health comes out to the house and checks bp several x/wk.  If bp rises, we could always go to whole tab.    Caller verbalized understanding and was grateful for the call back.  [name], NP 07/04/2017, 10:06 AM

## 2017-07-06 ENCOUNTER — Other Ambulatory Visit: Payer: Self-pay | Admitting: Internal Medicine

## 2017-07-09 ENCOUNTER — Ambulatory Visit (INDEPENDENT_AMBULATORY_CARE_PROVIDER_SITE_OTHER): Payer: Self-pay

## 2017-07-09 ENCOUNTER — Telehealth: Payer: Self-pay

## 2017-07-09 DIAGNOSIS — Z95810 Presence of automatic (implantable) cardiac defibrillator: Secondary | ICD-10-CM

## 2017-07-09 DIAGNOSIS — I5022 Chronic systolic (congestive) heart failure: Secondary | ICD-10-CM

## 2017-07-09 NOTE — Progress Notes (Signed)
Increase Lasix to 80 mg bid x 3 days then back to 80 qam/40 qpm.

## 2017-07-09 NOTE — Telephone Encounter (Signed)
Attempted call to patients brother, Rice Walsh.  Unable to leave a message.

## 2017-07-09 NOTE — Progress Notes (Signed)
EPIC Encounter for ICM Monitoring  Patient Name: Carl [name] is a 66 y.o. male Date: 07/09/2017 Primary Care Physican: [name], MD Primary Cardiologist:[place] Electrophysiologist: Allred DryWeight:unknown      Attempted call to patient's brother and unable to reach or leave a message  Transmission reviewed.    Thoracic impedance abnormal suggesting fluid accumulation since 07/02/2017.  Prescribed dosage: Furosemide 40 mg 2 tablets (80 mg total) in AM and 1 tab (40 mg) in PM. Potassium 20 mEq 1 tablet twice a day.   Recommendations: NONE - Unable to reach.  Follow-up plan: ICM clinic phone appointment on 07/23/2017.  Office appointment scheduled 09/21/2017 with Dr. [name].  Copy of ICM check sent to Dr. Rayann Heman and Dr. [name].   3 month ICM trend: 07/09/2017    1 Year ICM trend:       Rosalene Billings, RN 07/09/2017 1:49 PM

## 2017-07-10 NOTE — Progress Notes (Signed)
Call to brother, Kay Ricciuti, [place].  Advised of Dr Claris Gladden recommendation to increase Furosemide to 80 mg twice a day and then back to 80 mg every AM and 40 mg every PM.  Brother asked to review all his medications and said he was told to stop Meloxicam by a physician's office but unsure which physician.

## 2017-07-23 ENCOUNTER — Ambulatory Visit (INDEPENDENT_AMBULATORY_CARE_PROVIDER_SITE_OTHER): Payer: Medicare Other

## 2017-07-23 DIAGNOSIS — I5022 Chronic systolic (congestive) heart failure: Secondary | ICD-10-CM

## 2017-07-23 DIAGNOSIS — Z95810 Presence of automatic (implantable) cardiac defibrillator: Secondary | ICD-10-CM

## 2017-07-23 NOTE — Progress Notes (Signed)
EPIC Encounter for ICM Monitoring  Patient Name: Carl [name] is a 66 y.o. male Date: 07/23/2017 Primary Care Physican: [name], MD Primary [place] Electrophysiologist: Allred DryWeight:unknown         Spoke with brother [name].  Heart Failure questions reviewed, pt asymptomatic. Patient has been eating restaurant foods and was not aware how much sodium were in those foods.    Thoracic impedance abnormal suggesting fluid accumulation suggesting fluid since 07/16/2017.  Prescribed dosage: Furosemide 40 mg 2 tablets (80 mg total) in AM and 1 tab (40 mg) in PM. Potassium 20 mEq 1 tablet twice a day.  Recommendations: Copy of ICM check sent to Dr. Rayann Heman and Dr. [name] for review and recommendations if needed.   Reference material mailed on HF and low salt diet.   Follow-up plan: ICM clinic phone appointment on 07/30/2017.  Office appointment scheduled 09/21/2017 with Dr. [name].  3 month ICM trend: 07/23/2017    1 Year ICM trend:       Rosalene Billings, RN 07/23/2017 5:00 PM   '

## 2017-07-24 NOTE — Progress Notes (Signed)
Cut back on sodium intake.

## 2017-07-28 ENCOUNTER — Telehealth (HOSPITAL_COMMUNITY): Payer: Self-pay

## 2017-07-28 MED ORDER — FUROSEMIDE 40 MG PO TABS: ORAL_TABLET | ORAL | 3 refills | Status: DC

## 2017-07-28 NOTE — Telephone Encounter (Signed)
Pt brother aware of results   Increase Lasix to 80 mg bid x 3 days then back to 80 qam/40 qpm -DM

## 2017-07-30 ENCOUNTER — Ambulatory Visit (INDEPENDENT_AMBULATORY_CARE_PROVIDER_SITE_OTHER): Payer: Self-pay

## 2017-07-30 ENCOUNTER — Encounter: Payer: Medicare Other | Attending: Cardiology | Admitting: *Deleted

## 2017-07-30 ENCOUNTER — Encounter: Payer: Self-pay | Admitting: *Deleted

## 2017-07-30 VITALS — Ht 71.75 in | Wt 175.5 lb

## 2017-07-30 DIAGNOSIS — I5022 Chronic systolic (congestive) heart failure: Secondary | ICD-10-CM

## 2017-07-30 DIAGNOSIS — Z79899 Other long term (current) drug therapy: Secondary | ICD-10-CM | POA: Insufficient documentation

## 2017-07-30 DIAGNOSIS — Z87891 Personal history of nicotine dependence: Secondary | ICD-10-CM | POA: Diagnosis not present

## 2017-07-30 DIAGNOSIS — Z7982 Long term (current) use of aspirin: Secondary | ICD-10-CM | POA: Insufficient documentation

## 2017-07-30 DIAGNOSIS — Z95810 Presence of automatic (implantable) cardiac defibrillator: Secondary | ICD-10-CM

## 2017-07-30 NOTE — Progress Notes (Signed)
EPIC Encounter for ICM Monitoring  Patient Name: Carl [name] is a 66 y.o. male Date: 07/30/2017 Primary Care Physican: [name], MD Primary Cardiologist:[place] Electrophysiologist: Allred DryWeight:unknown        Spoke with brother [name].  Heart Failure questions reviewed, pt asymptomatic.  Brother reported the at he was confused on Lasix and Potassium and now has patient taking correct dosage.   Thoracic impedance returned to normal.  Prescribed dosage: Furosemide 40 mg 2 tablets (80 mg total) in AM and 1 tab (40 mg) in PM. Potassium 20 mEq 1 tablet twice a day.  Recommendations: No changes.    Encouraged to call for fluid symptoms.  Follow-up plan: ICM clinic phone appointment on 09/03/2017.    Copy of ICM check sent to Dr. Rayann Heman   3 month ICM trend: 07/30/2017    1 Year ICM trend:       Rosalene Billings, RN 07/30/2017 3:26 PM

## 2017-07-30 NOTE — Progress Notes (Signed)
Daily Session Note  Patient Details  Name: Carl [name] MRN: [id number] Date of Birth: 1951/01/09 Referring Provider:     Cardiac Rehab from 07/30/2017 in [facility] Cardiac and Pulmonary Rehab  Referring Provider  [name]      Encounter Date: 07/30/2017  Check In: Session Check In - 07/30/17 1321      Check-In   Location  [facility]-Cardiac & Pulmonary Rehab    Staff Present  Heath Lark, RN, BSN, Lance Sell, BA, ACSM CEP, Exercise Physiologist;Meredith Sherryll Burger, RN BSN    Supervising physician immediately available to respond to emergencies  See telemetry face sheet for immediately available ER MD    Warm-up and Cool-down  Performed as group-led instruction    Resistance Training Performed  Yes    VAD Patient?  No        Exercise Prescription Changes - 07/30/17 1250      Response to Exercise   Blood Pressure (Admit)  92/60    Blood Pressure (Exercise)  118/64    Blood Pressure (Exit)  106/64    Heart Rate (Admit)  70 bpm    Heart Rate (Exercise)  105 bpm    Heart Rate (Exit)  74 bpm       Social History   Tobacco Use  Smoking Status Former Smoker  Smokeless Tobacco Never Used    Goals Met:  Exercise tolerated well Personal goals reviewed Strength training completed today  Goals Unmet:  Not Applicable  Comments: Medical review completed today   Dr. Emily Filbert is Medical Director for [facility].

## 2017-07-30 NOTE — Progress Notes (Signed)
Cardiac Individual Treatment Plan  Patient Details  Name: Carl [name] MRN: [id number] Date of Birth: 12-30-1950 Referring Provider:     Cardiac Rehab from 07/30/2017 in [facility] Cardiac and Pulmonary Rehab  Referring Provider  [name]      Initial Encounter Date:    Cardiac Rehab from 07/30/2017 in [facility] Cardiac and Pulmonary Rehab  Date  07/30/17  Referring Provider  [name]      Visit Diagnosis: Heart failure, chronic systolic ([place])  Patient's Home Medications on Admission:  Current Outpatient Medications:  .  aspirin 81 MG tablet, Take 81 mg by mouth 2 (two) times daily., Disp: , Rfl:  .  atorvastatin (LIPITOR) 40 MG tablet, Take 1 tablet (40 mg total) by mouth daily at 6 PM., Disp: 30 tablet, Rfl: 1 .  carvedilol (COREG) 3.125 MG tablet, Take 3.125 mg by mouth 2 (two) times daily with a meal., Disp: , Rfl:  .  citalopram (CELEXA) 20 MG tablet, Take 1 tablet by mouth daily., Disp: , Rfl: 5 .  cyanocobalamin 500 MCG tablet, Take 500 mcg by mouth daily., Disp: , Rfl:  .  eplerenone (INSPRA) 25 MG tablet, Take 1 tablet (25 mg total) by mouth daily., Disp: 30 tablet, Rfl: 6 .  ferrous sulfate 325 (65 FE) MG tablet, Take 325 mg by mouth daily with breakfast., Disp: , Rfl:  .  furosemide (LASIX) 40 MG tablet, Taking 2 tablets (80 mg) every AM and 40 mg in the PM, Disp: 30 tablet, Rfl: 3 .  losartan (COZAAR) 25 MG tablet, Take 0.5 tablets (12.5 mg total) daily by mouth., Disp: , Rfl:  .  meloxicam (MOBIC) 7.5 MG tablet, Take 7.5 mg daily by mouth. Brother reported patient is no longer taking., Disp: , Rfl:  .  omeprazole (PRILOSEC) 20 MG capsule, Take 20 mg by mouth 2 (two) times daily., Disp: , Rfl:  .  potassium chloride SA (KLOR-CON M20) 20 MEQ tablet, Take 1 tablet (20 mEq total) 2 (two) times daily by mouth., Disp: 60 tablet, Rfl: 3  Past Medical History: Past Medical History:  Diagnosis Date  . Arthritis   . Brunner's gland hyperplasia of duodenum   . CHF (congestive heart  failure) ([place])   . Coronary artery disease   . External hemorrhoids   . GERD (gastroesophageal reflux disease)   . Hearing loss   . HH (hiatus hernia)   . Internal hemorrhoids   . Ischemic cardiomyopathy   . Leg fracture, left   . Myocardial infarction ([place])   . Paronychia   . Psoriasis   . PUD (peptic ulcer disease)   . Schatzki's ring   . Tubular adenoma of colon   . Vitiligo     Tobacco Use: Social History   Tobacco Use  Smoking Status Former Smoker  . Last attempt to quit: 2014  . Years since quitting: 4.9  Smokeless Tobacco Never Used  Tobacco Comment   07/30/2017 Quit in 2014    Labs: Recent Review Flowsheet Data    Labs for ITP Cardiac and Pulmonary Rehab Latest Ref Rng & Units 08/13/2014 08/14/2014 08/15/2014 11/07/2014 06/11/2017   Cholestrol 0 - 200 mg/dL - - - 109 -   LDLCALC 0 - 99 mg/dL - - - 62 -   HDL >39 mg/dL - - - 40 -   Trlycerides <150 mg/dL - - - 34 -   Hemoglobin A1c 4.8 - 5.6 % - - - - 5.3   PHART 7.350 - 7.450 - - - - -  PCO2ART 35.0 - 45.0 mmHg - - - - -   HCO3 20.0 - 24.0 mEq/L - - - - -   TCO2 0 - 100 mmol/L - - - - -   ACIDBASEDEF 0.0 - 2.0 mmol/L - - - - -   O2SAT % 59.3 80.0 64.5 - -       Exercise Target Goals: Date: 07/30/17  Exercise Program Goal: Individual exercise prescription set with THRR, safety & activity barriers. Participant demonstrates ability to understand and report RPE using BORG scale, to self-measure pulse accurately, and to acknowledge the importance of the exercise prescription.  Exercise Prescription Goal: Starting with aerobic activity 30 plus minutes a day, 3 days per week for initial exercise prescription. Provide home exercise prescription and guidelines that participant acknowledges understanding prior to discharge.  Activity Barriers & Risk Stratification: Activity Barriers & Cardiac Risk Stratification - 07/30/17 1323      Activity Barriers & Cardiac Risk Stratification   Activity Barriers  Back  Problems;Other (comment)    Comments  muscle aches   arms and back    Cardiac Risk Stratification  High       6 Minute Walk: 6 Minute Walk    Row Name 07/30/17 1434         6 Minute Walk   Distance  1425 feet     Walk Time  6 minutes     # of Rest Breaks  0     MPH  2.7     METS  2.93     RPE  15     Perceived Dyspnea   0     VO2 Peak  10.26     Symptoms  Yes (comment)     Comments  L leg broken twice and hurts     Resting HR  70 bpm     Resting BP  92/60     Max Ex. HR  84 bpm     Max Ex. BP  118/64     2 Minute Post BP  106/64        Oxygen Initial Assessment:   Oxygen Re-Evaluation:   Oxygen Discharge (Final Oxygen Re-Evaluation):   Initial Exercise Prescription: Initial Exercise Prescription - 07/30/17 1400      Date of Initial Exercise RX and Referring Provider   Date  07/30/17    Referring Provider  [name]      Treadmill   MPH  2.3    Grade  0.5    Minutes  15    METs  2.92      Recumbant Bike   Level  2    RPM  60    Watts  20    Minutes  15    METs  2.9      NuStep   Level  3    SPM  80    Minutes  15    METs  2.9      Prescription Details   Frequency (times per week)  3    Duration  Progress to 45 minutes of aerobic exercise without signs/symptoms of physical distress      Intensity   THRR 40-80% of Max Heartrate  98-126    Ratings of Perceived Exertion  11-13    Perceived Dyspnea  0-4      Resistance Training   Training Prescription  Yes    Weight  3 lb    Reps  10-15       Perform Capillary  Blood Glucose checks as needed.  Exercise Prescription Changes: Exercise Prescription Changes    Row Name 07/30/17 1250             Response to Exercise   Blood Pressure (Admit)  92/60       Blood Pressure (Exercise)  118/64       Blood Pressure (Exit)  106/64       Heart Rate (Admit)  70 bpm       Heart Rate (Exercise)  105 bpm       Heart Rate (Exit)  74 bpm          Exercise Comments:   Exercise Goals and  Review: Exercise Goals    Row Name 07/30/17 1434             Exercise Goals   Increase Physical Activity  Yes       Intervention  Provide advice, education, support and counseling about physical activity/exercise needs.;Develop an individualized exercise prescription for aerobic and resistive training based on initial evaluation findings, risk stratification, comorbidities and participant's personal goals.       Expected Outcomes  Achievement of increased cardiorespiratory fitness and enhanced flexibility, muscular endurance and strength shown through measurements of functional capacity and personal statement of participant.       Increase Strength and Stamina  Yes       Intervention  Provide advice, education, support and counseling about physical activity/exercise needs.;Develop an individualized exercise prescription for aerobic and resistive training based on initial evaluation findings, risk stratification, comorbidities and participant's personal goals.       Expected Outcomes  Achievement of increased cardiorespiratory fitness and enhanced flexibility, muscular endurance and strength shown through measurements of functional capacity and personal statement of participant.       Able to understand and use rate of perceived exertion (RPE) scale  Yes       Intervention  Provide education and explanation on how to use RPE scale       Expected Outcomes  Short Term: Able to use RPE daily in rehab to express subjective intensity level;Long Term:  Able to use RPE to guide intensity level when exercising independently       Able to understand and use Dyspnea scale  Yes       Intervention  Provide education and explanation on how to use Dyspnea scale       Expected Outcomes  Short Term: Able to use Dyspnea scale daily in rehab to express subjective sense of shortness of breath during exertion;Long Term: Able to use Dyspnea scale to guide intensity level when exercising independently       Knowledge  and understanding of Target Heart Rate Range (THRR)  Yes       Intervention  Provide education and explanation of THRR including how the numbers were predicted and where they are located for reference       Expected Outcomes  Short Term: Able to state/look up THRR;Long Term: Able to use THRR to govern intensity when exercising independently;Short Term: Able to use daily as guideline for intensity in rehab       Able to check pulse independently  Yes       Intervention  Provide education and demonstration on how to check pulse in carotid and radial arteries.;Review the importance of being able to check your own pulse for safety during independent exercise       Expected Outcomes  Short Term: Able to explain why pulse checking is  important during independent exercise;Long Term: Able to check pulse independently and accurately       Understanding of Exercise Prescription  Yes          Exercise Goals Re-Evaluation :   Discharge Exercise Prescription (Final Exercise Prescription Changes): Exercise Prescription Changes - 07/30/17 1250      Response to Exercise   Blood Pressure (Admit)  92/60    Blood Pressure (Exercise)  118/64    Blood Pressure (Exit)  106/64    Heart Rate (Admit)  70 bpm    Heart Rate (Exercise)  105 bpm    Heart Rate (Exit)  74 bpm       Nutrition:  Target Goals: Understanding of nutrition guidelines, daily intake of sodium 1500mg , cholesterol 200mg , calories 30% from fat and 7% or less from saturated fats, daily to have 5 or more servings of fruits and vegetables.  Biometrics: Pre Biometrics - 07/30/17 1433      Pre Biometrics   Height  5' 11.75" (1.822 m)    Weight  175 lb 8 oz (79.6 kg)    Waist Circumference  37.5 inches    Hip Circumference  42 inches    Waist to Hip Ratio  0.89 %    BMI (Calculated)  23.98    Single Leg Stand  3.04 seconds        Nutrition Therapy Plan and Nutrition Goals: Nutrition Therapy & Goals - 07/30/17 1324      Nutrition  Therapy   RD appointment defered  -- Deferred at present time.  Will reach out agian to family members that cook [place] home meals.,to see if they would be interested in talking with RD about Heart Failure nutrition guidelines.       Intervention Plan   Intervention  Prescribe, educate and counsel regarding individualized specific dietary modifications aiming towards targeted core components such as weight, hypertension, lipid management, diabetes, heart failure and other comorbidities.    Expected Outcomes  Short Term Goal: Understand basic principles of dietary content, such as calories, fat, sodium, cholesterol and nutrients.;Long Term Goal: Adherence to prescribed nutrition plan.       Nutrition Discharge: Rate Your Plate Scores:   Nutrition Goals Re-Evaluation:   Nutrition Goals Discharge (Final Nutrition Goals Re-Evaluation):   Psychosocial: Target Goals: Acknowledge presence or absence of significant depression and/or stress, maximize coping skills, provide positive support system. Participant is able to verbalize types and ability to use techniques and skills needed for reducing stress and depression.   Initial Review & Psychosocial Screening: Initial Psych Review & Screening - 07/30/17 1326      Initial Review   Current issues with  None Identified      Family Dynamics   Good Support System?  Yes Sister and Brother help [place]    Concerns  Recent loss of significant other    Comments  Mother passed away this year.        Barriers   Psychosocial barriers to participate in program  There are no identifiable barriers or psychosocial needs.;The patient should benefit from training in stress management and relaxation.      Screening Interventions   Interventions  Yes;Encouraged to exercise    Expected Outcomes  Short Term goal: Utilizing psychosocial counselor, staff and physician to assist with identification of specific Stressors or current issues interfering with healing  process. Setting desired goal for each stressor or current issue identified.;Long Term Goal: Stressors or current issues are controlled or eliminated.;Short Term goal: Identification  and review with participant of any Quality of Life or Depression concerns found by scoring the questionnaire.;Long Term goal: The participant improves quality of Life and PHQ9 Scores as seen by post scores and/or verbalization of changes       Quality of Life Scores:  Quality of Life - 07/30/17 1327      Quality of Life Scores   Health/Function Pre  21 %    Socioeconomic Pre  20.67 %    Psych/Spiritual Pre  21 %    Family Pre  21 %    GLOBAL Pre  20.94 %       PHQ-9: Recent Review Flowsheet Data    Depression screen [facility] 2/9 07/30/2017   Decreased Interest 0   Down, Depressed, Hopeless 0   PHQ - 2 Score 0   Altered sleeping 0   Tired, decreased energy 0   Change in appetite 0   Feeling bad or failure about yourself  0   Trouble concentrating 0   Moving slowly or fidgety/restless 0   Suicidal thoughts 0   PHQ-9 Score 0   Difficult doing work/chores Not difficult at all     Interpretation of Total Score  Total Score Depression Severity:  1-4 = Minimal depression, 5-9 = Mild depression, 10-14 = Moderate depression, 15-19 = Moderately severe depression, 20-27 = Severe depression   Psychosocial Evaluation and Intervention:   Psychosocial Re-Evaluation:   Psychosocial Discharge (Final Psychosocial Re-Evaluation):   Vocational Rehabilitation: Provide vocational rehab assistance to qualifying candidates.   Vocational Rehab Evaluation & Intervention: Vocational Rehab - 07/30/17 1330      Initial Vocational Rehab Evaluation & Intervention   Assessment shows need for Vocational Rehabilitation  No       Education: Education Goals: Education classes will be provided on a variety of topics geared toward better understanding of heart health and risk factor modification. Participant will state  understanding/return demonstration of topics presented as noted by education test scores.  Learning Barriers/Preferences: Learning Barriers/Preferences - 07/30/17 1329      Learning Barriers/Preferences   Learning Barriers  Reading;Inability to learn new things;Hearing Slow understanding     Learning Preferences  Audio;Individual Instruction;Verbal Instruction;Group Instruction       Education Topics: General Nutrition Guidelines/Fats and Fiber: -Group instruction provided by verbal, written material, models and posters to present the general guidelines for heart healthy nutrition. Gives an explanation and review of dietary fats and fiber.   Cardiac Rehab from 02/19/2015 in [facility] Cardiac and Pulmonary Rehab  Date  02/12/15  Educator  PI  Instruction Review Code (retired)  2- meets goals/outcomes      Controlling Sodium/Reading Food Labels: -Group verbal and written material supporting the discussion of sodium use in heart healthy nutrition. Review and explanation with models, verbal and written materials for utilization of the food label.   Cardiac Rehab from 02/19/2015 in [facility] Cardiac and Pulmonary Rehab  Date  02/19/15  Educator  CR  Instruction Review Code (retired)  2- meets goals/outcomes      Exercise Physiology & Risk Factors: - Group verbal and written instruction with models to review the exercise physiology of the cardiovascular system and associated critical values. Details cardiovascular disease risk factors and the goals associated with each risk factor.   Cardiac Rehab from 02/19/2015 in [facility] Cardiac and Pulmonary Rehab  Date  01/10/15  Educator  RM  Instruction Review Code (retired)  2- meets goals/outcomes      Aerobic Exercise & Resistance Training: - [place]  group verbal and written discussion on the health impact of inactivity. On the components of aerobic and resistive training programs and the benefits of this training and how to safely progress through these  programs.   Cardiac Rehab from 02/19/2015 in [facility] Cardiac and Pulmonary Rehab  Date  01/17/15  Educator  RM  Instruction Review Code (retired)  2- Statistician, Balance, General Exercise Guidelines: - Provides group verbal and written instruction on the benefits of flexibility and balance training programs. Provides general exercise guidelines with specific guidelines to those with heart or lung disease. Demonstration and skill practice provided.   Cardiac Rehab from 02/19/2015 in [facility] Cardiac and Pulmonary Rehab  Date  01/15/15  Educator  SB  Instruction Review Code (retired)  2- meets goals/outcomes      Stress Management: - Provides group verbal and written instruction about the health risks of elevated stress, cause of high stress, and healthy ways to reduce stress.   Cardiac Rehab from 02/19/2015 in [facility] Cardiac and Pulmonary Rehab  Date  01/24/15  Educator  [facility]  Instruction Review Code (retired)  2- meets goals/outcomes      Depression: - Provides group verbal and written instruction on the correlation between heart/lung disease and depressed mood, treatment options, and the stigmas associated with seeking treatment.   Cardiac Rehab from 02/19/2015 in [facility] Cardiac and Pulmonary Rehab  Date  02/05/15  Educator  SB  Instruction Review Code (retired)  2- meets Designer, fashion/clothing & Physiology of the Heart: - Group verbal and written instruction and models provide basic cardiac anatomy and physiology, with the coronary electrical and arterial systems. Review of: AMI, Angina, Valve disease, Heart Failure, Cardiac Arrhythmia, Pacemakers, and the ICD.   Cardiac Rehab from 02/19/2015 in [facility] Cardiac and Pulmonary Rehab  Date  01/29/15  Educator  SB  Instruction Review Code (retired)  2- meets goals/outcomes      Cardiac Procedures: - Group verbal and written instruction to review commonly prescribed medications for heart disease. Reviews the  medication, class of the drug, and side effects. Includes the steps to properly store meds and maintain the prescription regimen. (beta blockers and nitrates)   Cardiac Rehab from 02/19/2015 in [facility] Cardiac and Pulmonary Rehab  Date  01/03/15  Educator  [facility]  Instruction Review Code (retired)  2- meets goals/outcomes      Cardiac Medications I: - Group verbal and written instruction to review commonly prescribed medications for heart disease. Reviews the medication, class of the drug, and side effects. Includes the steps to properly store meds and maintain the prescription regimen.   Cardiac Rehab from 02/19/2015 in [facility] Cardiac and Pulmonary Rehab  Date  01/08/15  Educator  Foster Simpson RN CCRP  Instruction Review Code (retired)  2- meets goals/outcomes      Cardiac Medications II: -Group verbal and written instruction to review commonly prescribed medications for heart disease. Reviews the medication, class of the drug, and side effects. (all other drug classes)    Go Sex-Intimacy & Heart Disease, Get SMART - Goal Setting: - Group verbal and written instruction through game format to discuss heart disease and the return to sexual intimacy. Provides group verbal and written material to discuss and apply goal setting through the application of the S.M.A.R.T. Method.   Cardiac Rehab from 02/19/2015 in [facility] Cardiac and Pulmonary Rehab  Date  01/03/15  Educator  [facility]  Instruction Review Code (retired)  2- meets  goals/outcomes      Other Matters of the Heart: - Provides group verbal, written materials and models to describe Heart Failure, Angina, Valve Disease, Peripheral Artery Disease, and Diabetes in the realm of heart disease. Includes description of the disease process and treatment options available to the cardiac patient.   Cardiac Rehab from 02/19/2015 in [facility] Cardiac and Pulmonary Rehab  Date  02/07/15  Educator  CE  Instruction Review Code (retired)  2- meets goals/outcomes       Exercise & Equipment Safety: - Individual verbal instruction and demonstration of equipment use and safety with use of the equipment.   Cardiac Rehab from 07/30/2017 in [facility] Cardiac and Pulmonary Rehab  Date  07/30/17  Educator  [facility]  Instruction Review Code  1- Verbalizes Understanding      Infection Prevention: - Provides verbal and written material to individual with discussion of infection control including proper hand washing and proper equipment cleaning during exercise session.   Cardiac Rehab from 07/30/2017 in [facility] Cardiac and Pulmonary Rehab  Date  07/30/17  Educator  [facility]  Instruction Review Code  1- Verbalizes Understanding      Falls Prevention: - Provides verbal and written material to individual with discussion of falls prevention and safety.   Cardiac Rehab from 07/30/2017 in [facility] Cardiac and Pulmonary Rehab  Date  07/30/17  Educator  [facility]  Instruction Review Code  1- Verbalizes Understanding      Diabetes: - Individual verbal and written instruction to review signs/symptoms of diabetes, desired ranges of glucose level fasting, after meals and with exercise. Acknowledge that pre and post exercise glucose checks will be done for 3 sessions at entry of program.   Other: -Provides group and verbal instruction on various topics (see comments)    Knowledge Questionnaire Score: Knowledge Questionnaire Score - 07/30/17 1330      Knowledge Questionnaire Score   Pre Score  22/28       Core Components/Risk Factors/Patient Goals at Admission: Personal Goals and Risk Factors at Admission - 07/30/17 1326      Core Components/Risk Factors/Patient Goals on Admission   Heart Failure  Yes    Intervention  Provide a combined exercise and nutrition program that is supplemented with education, support and counseling about heart failure. Directed toward relieving symptoms such as shortness of breath, decreased exercise tolerance, and extremity edema.    Expected Outcomes   Improve functional capacity of life;Short term: Attendance in program 2-3 days a week with increased exercise capacity. Reported lower sodium intake. Reported increased fruit and vegetable intake. Reports medication compliance.;Short term: Daily weights obtained and reported for increase. Utilizing diuretic protocols set by physician.;Long term: Adoption of self-care skills and reduction of barriers for early signs and symptoms recognition and intervention leading to self-care maintenance.    Lipids  Yes    Intervention  Provide education and support for participant on nutrition & aerobic/resistive exercise along with prescribed medications to achieve LDL 70mg , HDL >40mg .    Expected Outcomes  Short Term: Participant states understanding of desired cholesterol values and is compliant with medications prescribed. Participant is following exercise prescription and nutrition guidelines.;Long Term: Cholesterol controlled with medications as prescribed, with individualized exercise RX and with personalized nutrition plan. Value goals: LDL < 70mg , HDL > 40 mg.       Core Components/Risk Factors/Patient Goals Review:    Core Components/Risk Factors/Patient Goals at Discharge (Final Review):    ITP Comments: ITP Comments    Row Name 07/30/17 1322 07/30/17  1330         ITP Comments  Medical review completed today   ITP completed and sent for review, changes as needed and signature.   Documentation of diagnosis can be found in [facility] 10/17 and 10/26 2018  Medication  list not reviewed to day. Marqual did not bring his notebook  with his current list. He stated he will bring it with him next visit.         Comments:

## 2017-07-30 NOTE — Patient Instructions (Signed)
Patient Instructions  Patient Details  Name: Carl [name] MRN: [id number] Date of Birth: 29-Nov-1950 Referring Provider:  [name], MD  Below are the personal goals you chose as well as exercise and nutrition goals. Our goal is to help you keep on track towards obtaining and maintaining your goals. We will be discussing your progress on these goals with you throughout the program.  Initial Exercise Prescription: Initial Exercise Prescription - 07/30/17 1400      Date of Initial Exercise RX and Referring Provider   Date  07/30/17    Referring Provider  [name]      Treadmill   MPH  2.3    Grade  0.5    Minutes  15    METs  2.92      Recumbant Bike   Level  2    RPM  60    Watts  20    Minutes  15    METs  2.9      NuStep   Level  3    SPM  80    Minutes  15    METs  2.9      Prescription Details   Frequency (times per week)  3    Duration  Progress to 45 minutes of aerobic exercise without signs/symptoms of physical distress      Intensity   THRR 40-80% of Max Heartrate  98-126    Ratings of Perceived Exertion  11-13    Perceived Dyspnea  0-4      Resistance Training   Training Prescription  Yes    Weight  3 lb    Reps  10-15       Exercise Goals: Frequency: Be able to perform aerobic exercise three times per week working toward 3-5 days per week.  Intensity: Work with a perceived exertion of 11 (fairly light) - 15 (hard) as tolerated. Follow your new exercise prescription and watch for changes in prescription as you progress with the program. Changes will be reviewed with you when they are made.  Duration: You should be able to do 30 minutes of continuous aerobic exercise in addition to a 5 minute warm-up and a 5 minute cool-down routine.  Nutrition Goals: Your personal nutrition goals will be established when you do your nutrition analysis with the dietician.  The following are nutrition guidelines to follow: Cholesterol < 200mg /day Sodium <  1500mg /day Fiber: Men over 50 yrs - 30 grams per day  Personal Goals: Personal Goals and Risk Factors at Admission - 07/30/17 1326      Core Components/Risk Factors/Patient Goals on Admission   Heart Failure  Yes    Intervention  Provide a combined exercise and nutrition program that is supplemented with education, support and counseling about heart failure. Directed toward relieving symptoms such as shortness of breath, decreased exercise tolerance, and extremity edema.    Expected Outcomes  Improve functional capacity of life;Short term: Attendance in program 2-3 days a week with increased exercise capacity. Reported lower sodium intake. Reported increased fruit and vegetable intake. Reports medication compliance.;Short term: Daily weights obtained and reported for increase. Utilizing diuretic protocols set by physician.;Long term: Adoption of self-care skills and reduction of barriers for early signs and symptoms recognition and intervention leading to self-care maintenance.    Lipids  Yes    Intervention  Provide education and support for participant on nutrition & aerobic/resistive exercise along with prescribed medications to achieve LDL 70mg , HDL >40mg .    Expected Outcomes  Short Term: Participant states understanding of desired cholesterol values and is compliant with medications prescribed. Participant is following exercise prescription and nutrition guidelines.;Long Term: Cholesterol controlled with medications as prescribed, with individualized exercise RX and with personalized nutrition plan. Value goals: LDL < 70mg , HDL > 40 mg.       Tobacco Use Initial Evaluation: Social History   Tobacco Use  Smoking Status Former Smoker  . Last attempt to quit: 2014  . Years since quitting: 4.9  Smokeless Tobacco Never Used  Tobacco Comment   07/30/2017 Quit in 2014    Exercise Goals and Review: Exercise Goals    Row Name 07/30/17 1434             Exercise Goals   Increase  Physical Activity  Yes       Intervention  Provide advice, education, support and counseling about physical activity/exercise needs.;Develop an individualized exercise prescription for aerobic and resistive training based on initial evaluation findings, risk stratification, comorbidities and participant's personal goals.       Expected Outcomes  Achievement of increased cardiorespiratory fitness and enhanced flexibility, muscular endurance and strength shown through measurements of functional capacity and personal statement of participant.       Increase Strength and Stamina  Yes       Intervention  Provide advice, education, support and counseling about physical activity/exercise needs.;Develop an individualized exercise prescription for aerobic and resistive training based on initial evaluation findings, risk stratification, comorbidities and participant's personal goals.       Expected Outcomes  Achievement of increased cardiorespiratory fitness and enhanced flexibility, muscular endurance and strength shown through measurements of functional capacity and personal statement of participant.       Able to understand and use rate of perceived exertion (RPE) scale  Yes       Intervention  Provide education and explanation on how to use RPE scale       Expected Outcomes  Short Term: Able to use RPE daily in rehab to express subjective intensity level;Long Term:  Able to use RPE to guide intensity level when exercising independently       Able to understand and use Dyspnea scale  Yes       Intervention  Provide education and explanation on how to use Dyspnea scale       Expected Outcomes  Short Term: Able to use Dyspnea scale daily in rehab to express subjective sense of shortness of breath during exertion;Long Term: Able to use Dyspnea scale to guide intensity level when exercising independently       Knowledge and understanding of Target Heart Rate Range (THRR)  Yes       Intervention  Provide education  and explanation of THRR including how the numbers were predicted and where they are located for reference       Expected Outcomes  Short Term: Able to state/look up THRR;Long Term: Able to use THRR to govern intensity when exercising independently;Short Term: Able to use daily as guideline for intensity in rehab       Able to check pulse independently  Yes       Intervention  Provide education and demonstration on how to check pulse in carotid and radial arteries.;Review the importance of being able to check your own pulse for safety during independent exercise       Expected Outcomes  Short Term: Able to explain why pulse checking is important during independent exercise;Long Term: Able to check pulse independently and accurately  Understanding of Exercise Prescription  Yes          Copy of goals given to participant.

## 2017-08-04 ENCOUNTER — Telehealth: Payer: Self-pay | Admitting: Physician Assistant

## 2017-08-04 NOTE — Telephone Encounter (Signed)
Pt brother [name]) calling because pt has had hematuria 3 x today. Pt denies dysuria, no fevers or chills. No clots. Has PCP appt for tomorrow.   He recently got the furosemide refilled and is supposed to take 2 am and 1 pm, but was only given 30 tabs for a month supply. He is weighing daily.   His R arm is hurting. This has been going on for a while.   His wife died 2 months ago.   Son wishes [place] to know what his PCP says tomorrow.   Rosaria Ferries, PA-C 08/04/2017 6:11 PM Beeper [phone]

## 2017-08-05 ENCOUNTER — Telehealth: Payer: Self-pay | Admitting: *Deleted

## 2017-08-05 NOTE — Telephone Encounter (Signed)
Carl [name] called to let us know he would not be in class as he has a physician appointment .

## 2017-08-06 DIAGNOSIS — D649 Anemia, unspecified: Secondary | ICD-10-CM | POA: Insufficient documentation

## 2017-08-09 ENCOUNTER — Telehealth: Payer: Self-pay | Admitting: Internal Medicine

## 2017-08-09 NOTE — Telephone Encounter (Signed)
Patient's brother Jveon Pound called today and was trying to confirm what his medications are that he should be taking since he fixes his medications for the next 2 weeks.  He was asking if he is supposed to take spironolactone.  He was told that he should not be taking spironolactone.  He also was asking if patient should be taking eplerenone 25 mg twice daily.  He was told that he should take only once a day after confirming it from the med rec.  He told that patient takes 25 mg of losartan daily, potassium 2 tablets daily twice daily, and furosemide 40 mg in the evening and 80 mg in the morning.  After looking at the med rec and the notes it seems like these doses were fine.  Patient was told that he should go ahead and take these medications.

## 2017-08-10 ENCOUNTER — Encounter: Payer: Medicare Other | Admitting: *Deleted

## 2017-08-10 DIAGNOSIS — I5022 Chronic systolic (congestive) heart failure: Secondary | ICD-10-CM

## 2017-08-10 NOTE — Progress Notes (Signed)
Daily Session Note  Patient Details  Name: Carl [name] MRN: [id number] Date of Birth: [birth year] Referring Provider:     Cardiac Rehab from 07/30/2017 in [facility] Cardiac and Pulmonary Rehab  Referring Provider  [name]      Encounter Date: 08/10/2017  Check In: Session Check In - 08/10/17 1622      Check-In   Location  [facility]-Cardiac & Pulmonary Rehab    Staff Present  Earlean Shawl, BS, ACSM CEP, Exercise Physiologist;Carroll Enterkin, RN, BSN;Other    Supervising physician immediately available to respond to emergencies  See telemetry face sheet for immediately available ER MD    Medication changes reported      No    Fall or balance concerns reported     No    Warm-up and Cool-down  Performed on first and last piece of equipment    Resistance Training Performed  Yes    VAD Patient?  No      Pain Assessment   Currently in Pain?  No/denies    Multiple Pain Sites  No          Social History   Tobacco Use  Smoking Status Former Smoker  . Last attempt to quit: 2014  . Years since quitting: 4.9  Smokeless Tobacco Never Used  Tobacco Comment   07/30/2017 Quit in 2014    Goals Met:  Exercise tolerated well Personal goals reviewed No report of cardiac concerns or symptoms Strength training completed today  Goals Unmet:  Not Applicable  Comments: First full day of exercise!  Patient was oriented to gym and equipment including functions, settings, policies, and procedures.  Patient's individual exercise prescription and treatment plan were reviewed.  All starting workloads were established based on the results of the 6 minute walk test done at initial orientation visit.  The plan for exercise progression was also introduced and progression will be customized based on patient's performance and goals.    Dr. Emily Filbert is Medical Director for [place] and [facility].

## 2017-08-12 ENCOUNTER — Encounter: Payer: Medicare Other | Admitting: *Deleted

## 2017-08-12 DIAGNOSIS — I5022 Chronic systolic (congestive) heart failure: Secondary | ICD-10-CM | POA: Diagnosis not present

## 2017-08-12 NOTE — Progress Notes (Signed)
Daily Session Note  Patient Details  Name: ANDERS HOHMANN MRN: [id number] Date of Birth: [birth year] Referring Provider:     Cardiac Rehab from 07/30/2017 in [facility] Cardiac and Pulmonary Rehab  Referring Provider  [name]      Encounter Date: 08/12/2017  Check In: Session Check In - 08/12/17 1637      Check-In   Location  [facility]-Cardiac & Pulmonary Rehab    Staff Present  Renita Papa, RN Vickki Hearing, BA, ACSM CEP, Exercise Physiologist;Carroll Enterkin, RN, BSN    Supervising physician immediately available to respond to emergencies  See telemetry face sheet for immediately available ER MD    Medication changes reported      No    Fall or balance concerns reported     No    Warm-up and Cool-down  Performed on first and last piece of equipment    Resistance Training Performed  Yes    VAD Patient?  No      Pain Assessment   Currently in Pain?  No/denies          Social History   Tobacco Use  Smoking Status Former Smoker  . Last attempt to quit: 2014  . Years since quitting: 4.9  Smokeless Tobacco Never Used  Tobacco Comment   07/30/2017 Quit in 2014    Goals Met:  Independence with exercise equipment Exercise tolerated well No report of cardiac concerns or symptoms Strength training completed today  Goals Unmet:  Not Applicable  Comments: Pt able to follow exercise prescription today without complaint.  Will continue to monitor for progression.    Dr. Emily Filbert is Medical Director for [place] and [facility].

## 2017-08-13 ENCOUNTER — Other Ambulatory Visit: Payer: Self-pay

## 2017-08-13 ENCOUNTER — Emergency Department
Admission: EM | Admit: 2017-08-13 | Discharge: 2017-08-13 | Disposition: A | Discharge status: Home / Self Care | Payer: Medicare Other | Attending: Emergency Medicine | Admitting: Emergency Medicine

## 2017-08-13 ENCOUNTER — Emergency Department: Payer: Medicare Other

## 2017-08-13 DIAGNOSIS — K219 Gastro-esophageal reflux disease without esophagitis: Secondary | ICD-10-CM | POA: Insufficient documentation

## 2017-08-13 DIAGNOSIS — Z951 Presence of aortocoronary bypass graft: Secondary | ICD-10-CM | POA: Diagnosis not present

## 2017-08-13 DIAGNOSIS — I252 Old myocardial infarction: Secondary | ICD-10-CM | POA: Diagnosis not present

## 2017-08-13 DIAGNOSIS — I2510 Atherosclerotic heart disease of native coronary artery without angina pectoris: Secondary | ICD-10-CM | POA: Insufficient documentation

## 2017-08-13 DIAGNOSIS — R1013 Epigastric pain: Secondary | ICD-10-CM | POA: Diagnosis not present

## 2017-08-13 DIAGNOSIS — Z79899 Other long term (current) drug therapy: Secondary | ICD-10-CM | POA: Diagnosis not present

## 2017-08-13 DIAGNOSIS — Z87891 Personal history of nicotine dependence: Secondary | ICD-10-CM | POA: Diagnosis not present

## 2017-08-13 DIAGNOSIS — Z7982 Long term (current) use of aspirin: Secondary | ICD-10-CM | POA: Diagnosis not present

## 2017-08-13 DIAGNOSIS — I5040 Unspecified combined systolic (congestive) and diastolic (congestive) heart failure: Secondary | ICD-10-CM | POA: Insufficient documentation

## 2017-08-13 DIAGNOSIS — R079 Chest pain, unspecified: Secondary | ICD-10-CM | POA: Diagnosis present

## 2017-08-13 DIAGNOSIS — Z95810 Presence of automatic (implantable) cardiac defibrillator: Secondary | ICD-10-CM | POA: Diagnosis not present

## 2017-08-13 LAB — CBC
HCT: 33.1 % — ABNORMAL LOW (ref 40.0–52.0)
HEMOGLOBIN: 11.1 g/dL — AB (ref 13.0–18.0)
MCH: 32.7 pg (ref 26.0–34.0)
MCHC: 33.6 g/dL (ref 32.0–36.0)
MCV: 97.5 fL (ref 80.0–100.0)
PLATELETS: 215 10*3/uL (ref 150–440)
RBC: 3.39 MIL/uL — AB (ref 4.40–5.90)
RDW: 14.4 % (ref 11.5–14.5)
WBC: 5.8 10*3/uL (ref 3.8–10.6)

## 2017-08-13 LAB — LIPASE, BLOOD: Lipase: 48 U/L (ref 11–51)

## 2017-08-13 LAB — HEPATIC FUNCTION PANEL
ALT: 20 U/L (ref 17–63)
AST: 25 U/L (ref 15–41)
Albumin: 4.2 g/dL (ref 3.5–5.0)
Alkaline Phosphatase: 74 U/L (ref 38–126)
BILIRUBIN INDIRECT: 0.6 mg/dL (ref 0.3–0.9)
Bilirubin, Direct: 0.1 mg/dL (ref 0.1–0.5)
TOTAL PROTEIN: 7.5 g/dL (ref 6.5–8.1)
Total Bilirubin: 0.7 mg/dL (ref 0.3–1.2)

## 2017-08-13 LAB — BASIC METABOLIC PANEL
ANION GAP: 9 (ref 5–15)
BUN: 35 mg/dL — ABNORMAL HIGH (ref 6–20)
CALCIUM: 8.6 mg/dL — AB (ref 8.9–10.3)
CO2: 26 mmol/L (ref 22–32)
CREATININE: 1.43 mg/dL — AB (ref 0.61–1.24)
Chloride: 96 mmol/L — ABNORMAL LOW (ref 101–111)
GFR, EST AFRICAN AMERICAN: 57 mL/min — AB (ref >60)
GFR, EST NON AFRICAN AMERICAN: 50 mL/min — AB (ref >60)
Glucose, Bld: 99 mg/dL (ref 65–99)
Potassium: 4.2 mmol/L (ref 3.5–5.1)
SODIUM: 131 mmol/L — AB (ref 135–145)

## 2017-08-13 LAB — TROPONIN I: TROPONIN I: 0.03 ng/mL — AB (ref <0.03)

## 2017-08-13 MED ORDER — GI COCKTAIL ~~LOC~~
30.0000 mL | ORAL | Status: AC
  Administered 2017-08-13: 30 mL via ORAL
  Filled 2017-08-13: qty 30

## 2017-08-13 MED ORDER — FAMOTIDINE 20 MG PO TABS
40.0000 mg | ORAL_TABLET | Freq: Once | ORAL | Status: AC
  Administered 2017-08-13: 40 mg via ORAL
  Filled 2017-08-13: qty 2

## 2017-08-13 MED ORDER — ALUMINUM-MAGNESIUM-SIMETHICONE 200-200-20 MG/5ML PO SUSP: 30.0000 mL | Freq: Three times a day (TID) | ORAL | 0 refills | Status: DC

## 2017-08-13 NOTE — ED Provider Notes (Signed)
[facility] Emergency Department Provider Note  ____________________________________________  Time seen: Approximately 4:59 PM  I have reviewed the triage vital signs and the nursing notes.   HISTORY  Chief Complaint Chest Pain    HPI Carl [name] is a 66 y.o. male with a history of CAD and GERD who complains of epigastric pain that started this morning after eating breakfast of steak and biscuits. Nonradiating, no alleviating factors, moderate intensity, burning quality. Not exertional or pleuritic. No shortness of breath vomiting or diaphoresis associated with it. Does not feel like pain that he's had in the past with a previous N STEMI.     Past Medical History:  Diagnosis Date  . Arthritis   . Brunner's gland hyperplasia of duodenum   . CHF (congestive heart failure) ([place])   . Coronary artery disease   . External hemorrhoids   . GERD (gastroesophageal reflux disease)   . Hearing loss   . HH (hiatus hernia)   . Internal hemorrhoids   . Ischemic cardiomyopathy   . Leg fracture, left   . Myocardial infarction ([place])   . Paronychia   . Psoriasis   . PUD (peptic ulcer disease)   . Schatzki's ring   . Tubular adenoma of colon   . Vitiligo      Patient Active Problem List   Diagnosis Date Noted  . Acute on chronic systolic (congestive) heart failure ([place]) 06/11/2017  . Prostate cancer screening 04/28/2017  . Hydrocele 04/28/2017  . Bradycardia   . Premature ventricular contraction   . Systolic dysfunction with acute on chronic heart failure ([place])   . Non-rheumatic mitral regurgitation   . Chronic systolic dysfunction of left ventricle 05/01/2015  . Cardiomyopathy, ischemic 04/26/2015  . Difficulty hearing 12/29/2014  . Acute on chronic systolic CHF (congestive heart failure) ([place]) 10/10/2014  . 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 Acute systolic heart failure ([place]) 08/01/2014  . Protein-calorie malnutrition, severe ([place]) 07/29/2014  . ST  elevation myocardial infarction (STEMI) of anterolateral wall ([place]) 07/29/2014  . Cardiogenic shock ([place]) 07/29/2014  . Tobacco use 07/29/2014  . AKI (acute kidney injury) ([place]) 07/29/2014     Past Surgical History:  Procedure Laterality Date  . CARDIAC SURGERY    . CORONARY ARTERY BYPASS GRAFT N/A 08/03/2014   Procedure: CORONARY ARTERY BYPASS GRAFTING (CABG);  Surgeon: [name], MD;  Location: [facility];  Service: Open Heart Surgery;  Laterality: N/A;  . EP IMPLANTABLE DEVICE N/A 05/01/2015   MDT ICD implanted for primary prevention of sudden death  . ESOPHAGOGASTRODUODENOSCOPY (EGD) WITH PROPOFOL N/A 04/16/2015   Procedure: ESOPHAGOGASTRODUODENOSCOPY (EGD) WITH PROPOFOL;  Surgeon: [name], MD;  Location: [facility] ENDOSCOPY;  Service: Endoscopy;  Laterality: N/A;  . HERNIA REPAIR    . TEE WITHOUT CARDIOVERSION N/A 08/03/2014   Procedure: TRANSESOPHAGEAL ECHOCARDIOGRAM (TEE);  Surgeon: [name], MD;  Location: [place];  Service: Open Heart Surgery;  Laterality: N/A;     Prior to Admission medications   Medication Sig Start Date End Date Taking? Authorizing Provider  aluminum-magnesium hydroxide-simethicone (MAALOX) [phone] MG/5ML SUSP Take 30 mLs by mouth 4 (four) times daily -  before meals and at bedtime. 08/13/17   Carrie Mew, MD  aspirin 81 MG tablet Take 81 mg by mouth 2 (two) times daily.    [provider]  atorvastatin (LIPITOR) 40 MG tablet Take 1 tablet (40 mg total) by mouth daily at 6 PM. 08/15/14   Gold, Patrick Jupiter E, PA-C  carvedilol (  COREG) 3.125 MG tablet Take 3.125 mg by mouth 2 (two) times daily with a meal.    [provider]  citalopram (CELEXA) 20 MG tablet Take 1 tablet by mouth daily. 07/09/16   [provider]  cyanocobalamin 500 MCG tablet Take 500 mcg by mouth daily.    [provider]  eplerenone (INSPRA) 25 MG tablet Take 1 tablet (25 mg total) by mouth daily. 06/19/17   Arbutus Leas, NP  ferrous sulfate  325 (65 FE) MG tablet Take 325 mg by mouth daily with breakfast.    [provider]  furosemide (LASIX) 40 MG tablet Taking 2 tablets (80 mg) every AM and 40 mg in the PM 07/28/17   [name], MD  losartan (COZAAR) 25 MG tablet Take 0.5 tablets (12.5 mg total) daily by mouth. 07/04/17   Theora Gianotti, NP  meloxicam (MOBIC) 7.5 MG tablet Take 7.5 mg daily by mouth. Brother reported patient is no longer taking.    [provider]  omeprazole (PRILOSEC) 20 MG capsule Take 20 mg by mouth 2 (two) times daily.    [provider]  potassium chloride SA (KLOR-CON M20) 20 MEQ tablet Take 1 tablet (20 mEq total) 2 (two) times daily by mouth. 07/01/17   [name], MD     Allergies Spironolactone   Family History  Problem Relation Age of Onset  . Valvular heart disease Mother        Ruptured valve  . CAD Father   . Heart Problems Brother        Stents x 4  . Diabetes Brother   . Prostate cancer Neg Hx   . Bladder Cancer Neg Hx   . Kidney cancer Neg Hx     Social History Social History   Tobacco Use  . Smoking status: Former Smoker    Last attempt to quit: 2014    Years since quitting: 4.9  . Smokeless tobacco: Never Used  . Tobacco comment: 07/30/2017 Quit in 2014  Substance Use Topics  . Alcohol use: No    Alcohol/week: 0.0 oz  . Drug use: No    Review of Systems  Constitutional:   No fever or chills.  ENT:   No sore throat. No rhinorrhea. Cardiovascular:   No chest pain or syncope. Respiratory:   No dyspnea or cough. Gastrointestinal:   Positive as above for epigastric pain without vomiting and diarrhea.  Musculoskeletal:   Negative for focal pain or swelling All other systems reviewed and are negative except as documented above in ROS and HPI.  ____________________________________________   PHYSICAL EXAM:  VITAL SIGNS: ED Triage Vitals  Enc Vitals Group     BP 08/13/17 1504 102/74     Pulse Rate 08/13/17 1504 76      Resp 08/13/17 1504 16     Temp 08/13/17 1504 97.6 F (36.4 C)     Temp Source 08/13/17 1504 Oral     SpO2 08/13/17 1504 100 %     Weight 08/13/17 1505 170 lb (77.1 kg)     Height 08/13/17 1505 6' (1.829 m)     Head Circumference --      Peak Flow --      Pain Score 08/13/17 1504 0     Pain Loc --      Pain Edu? --      Excl. in [place]? --     Vital signs reviewed, nursing assessments reviewed.   Constitutional:   Alert  and oriented. Well appearing and in no distress. Eyes:   No scleral icterus.  EOMI. No nystagmus. No conjunctival pallor. PERRL. ENT   Head:   Normocephalic and atraumatic.   Nose:   No congestion/rhinnorhea.    Mouth/Throat:   MMM, no pharyngeal erythema. No peritonsillar mass.    Neck:   No meningismus. Full ROM. Hematological/Lymphatic/Immunilogical:   No cervical lymphadenopathy. Cardiovascular:   RRR. Symmetric bilateral radial and DP pulses.  No murmurs.  Respiratory:   Normal respiratory effort without tachypnea/retractions. Breath sounds are clear and equal bilaterally. No wheezes/rales/rhonchi. Gastrointestinal:   Soft with mild left upper quadrant tenderness. Non distended. There is no CVA tenderness.  No rebound, rigidity, or guarding. No bruit or pulsatile mass Genitourinary:   deferred Musculoskeletal:   Normal range of motion in all extremities. No joint effusions.  No lower extremity tenderness.  No edema. Neurologic:   Normal speech and language.  Motor grossly intact. No gross focal neurologic deficits are appreciated.  Skin:    Skin is warm, dry and intact. No rash noted.  No petechiae, purpura, or bullae.  ____________________________________________    LABS (pertinent positives/negatives) (all labs ordered are listed, but only abnormal results are displayed) Labs Reviewed  BASIC METABOLIC PANEL - Abnormal; Notable for the following components:      Result Value   Sodium 131 (*)    Chloride 96 (*)    BUN 35 (*)    Creatinine,  Ser 1.43 (*)    Calcium 8.6 (*)    GFR calc non Af Amer 50 (*)    GFR calc Af Amer 57 (*)    All other components within normal limits  CBC - Abnormal; Notable for the following components:   RBC 3.39 (*)    Hemoglobin 11.1 (*)    HCT 33.1 (*)    All other components within normal limits  TROPONIN I - Abnormal; Notable for the following components:   Troponin I 0.03 (*)    All other components within normal limits  HEPATIC FUNCTION PANEL  LIPASE, BLOOD   ____________________________________________   EKG  Interpreted by me Normal sinus rhythm rate of 77, normal axis and intervals. Poor R-wave progression in anterior precordial leads. Normal ST segments and T waves. No acute ischemic changes. No significant change from 06/10/2017.  ____________________________________________    RADIOLOGY  Dg Chest 2 View  Result Date: 08/13/2017 CLINICAL DATA:  Chest pain. EXAM: CHEST  2 VIEW COMPARISON:  Chest x-ray dated June 11, 2017. FINDINGS: Unchanged single lead left chest wall pacer device. Stable moderate cardiomegaly. Normal pulmonary vascularity. No focal consolidation, pleural effusion, or pneumothorax. No acute osseous abnormality. IMPRESSION: Stable moderate cardiomegaly.  No effusion or edema. Electronically Signed   By: Titus Dubin M.D.   On: 08/13/2017 16:05    ____________________________________________   PROCEDURES Procedures  ____________________________________________     CLINICAL IMPRESSION / ASSESSMENT AND PLAN / ED COURSE  Pertinent labs & imaging results that were available during my care of the patient were reviewed by me and considered in my medical decision making (see chart for details).   Patient presents with epigastric pain consistent with GERD or gastritis likely food related. Considering the patient's symptoms, medical history, and physical examination today, I have low suspicion for cholecystitis or biliary pathology, pancreatitis,  perforation or bowel obstruction, hernia, intra-abdominal abscess, AAA or dissection, volvulus or intussusception, mesenteric ischemia, or appendicitis.  Low suspicion of ACS PE dissection or pneumothorax or pericarditis. Patient is calm and comfortable,  ambulatory with unremarkable labs and reassuring exam, unremarkable vital signs. Suitable for outpatient follow-up.      ____________________________________________   FINAL CLINICAL IMPRESSION(S) / ED DIAGNOSES    Final diagnoses:  Epigastric pain  Gastroesophageal reflux disease, esophagitis presence not specified      This SmartLink is deprecated. Use AVSMEDLIST instead to display the medication list for a patient.   Portions of this note were generated with dragon dictation software. Dictation errors may occur despite best attempts at proofreading.    Carrie Mew, MD 08/13/17 [phone]

## 2017-08-13 NOTE — ED Notes (Signed)
First nurse note: Pt arrived via EMS from the [place]. Pt presented himself to the FD with reports of burning chest pain that began today at approximately 0900. FD called EMS. EMS reports VSS, NSR. Pt with history of CABG. No distress noted upon arrival.

## 2017-08-13 NOTE — ED Notes (Signed)
Pt reports burning sensation after he ate breakfast this morning.  Pt reports eating steak and biscuits for breakfast.  Pt reported to be a wandered.  Pt states he was brought in by [facility] today.  Pt states that this pain has gone away since this morning.

## 2017-08-13 NOTE — ED Triage Notes (Signed)
Pt presents to ED for central CP that he states is burning in nature that began at 9am. Denies taking anything for pain. Denies SOB, dizziness. Some nausea. Alert, oriented, has a 20 L AC from EMS. In wheelchair.

## 2017-08-19 ENCOUNTER — Encounter: Payer: Medicare Other | Admitting: *Deleted

## 2017-08-19 DIAGNOSIS — I5022 Chronic systolic (congestive) heart failure: Secondary | ICD-10-CM | POA: Diagnosis not present

## 2017-08-19 NOTE — Progress Notes (Signed)
Daily Session Note  Patient Details  Name: Carl [name] MRN: [id number] Date of Birth: 09-13-1950 Referring Provider:     Cardiac Rehab from 07/30/2017 in [facility] Cardiac and Pulmonary Rehab  Referring Provider  [name]      Encounter Date: 08/19/2017  Check In: Session Check In - 08/19/17 1704      Check-In   Location  [facility]-Cardiac & Pulmonary Rehab    Staff Present  Renita Papa, RN Vickki Hearing, BA, ACSM CEP, Exercise Physiologist;Carroll Enterkin, RN, BSN    Supervising physician immediately available to respond to emergencies  See telemetry face sheet for immediately available ER MD    Medication changes reported      No    Fall or balance concerns reported     No    Warm-up and Cool-down  Performed on first and last piece of equipment    Resistance Training Performed  Yes    VAD Patient?  No      Pain Assessment   Currently in Pain?  No/denies        Exercise Prescription Changes - 08/19/17 1100      Response to Exercise   Blood Pressure (Admit)  104/62    Blood Pressure (Exercise)  122/72    Blood Pressure (Exit)  118/70    Heart Rate (Admit)  80 bpm    Heart Rate (Exercise)  110 bpm    Heart Rate (Exit)  92 bpm    Rating of Perceived Exertion (Exercise)  13    Symptoms  none    Duration  Progress to 45 minutes of aerobic exercise without signs/symptoms of physical distress    Intensity  THRR unchanged      Progression   Progression  Continue to progress workloads to maintain intensity without signs/symptoms of physical distress.    Average METs  2.93      Resistance Training   Training Prescription  Yes    Weight  3 lb    Reps  10-15      Interval Training   Interval Training  No      Treadmill   MPH  2.3    Grade  0.5    Minutes  15    METs  3.08      Recumbant Bike   Level  2    RPM  60    Watts  28    Minutes  15    METs  2.77       Social History   Tobacco Use  Smoking Status Former Smoker  . Last attempt to quit: 2014  .  Years since quitting: 4.9  Smokeless Tobacco Never Used  Tobacco Comment   07/30/2017 Quit in 2014    Goals Met:  Independence with exercise equipment Exercise tolerated well No report of cardiac concerns or symptoms Strength training completed today  Goals Unmet:  Not Applicable  Comments: Pt able to follow exercise prescription today without complaint.  Will continue to monitor for progression.    Dr. Emily Filbert is Medical Director for [facility].

## 2017-08-20 DIAGNOSIS — I5022 Chronic systolic (congestive) heart failure: Secondary | ICD-10-CM | POA: Diagnosis not present

## 2017-08-20 NOTE — Progress Notes (Signed)
Daily Session Note  Patient Details  Name: Carl [name] MRN: [id number] Date of Birth: Sep 07, 1950 Referring Provider:     Cardiac Rehab from 07/30/2017 in [facility] Cardiac and Pulmonary Rehab  Referring Provider  [name]      Encounter Date: 08/20/2017  Check In: Session Check In - 08/20/17 1618      Check-In   Location  [facility]-Cardiac & Pulmonary Rehab    Staff Present  Justin Mend RCP,RRT,BSRT;Meredith Sherryll Burger, RN BSN;Laureen Janell Quiet, RRT, Respiratory Therapist    Supervising physician immediately available to respond to emergencies  See telemetry face sheet for immediately available ER MD    Medication changes reported      No    Fall or balance concerns reported     No    Warm-up and Cool-down  Performed on first and last piece of equipment    Resistance Training Performed  Yes    VAD Patient?  No      Pain Assessment   Currently in Pain?  No/denies          Social History   Tobacco Use  Smoking Status Former Smoker  . Last attempt to quit: 2014  . Years since quitting: 4.9  Smokeless Tobacco Never Used  Tobacco Comment   07/30/2017 Quit in 2014    Goals Met:  Exercise tolerated well No report of cardiac concerns or symptoms Strength training completed today  Goals Unmet:  Not Applicable  Comments: Pt able to follow exercise prescription today without complaint.  Will continue to monitor for progression.   Dr. Emily Filbert is Medical Director for [facility].

## 2017-08-21 ENCOUNTER — Encounter: Payer: Self-pay | Admitting: *Deleted

## 2017-08-24 ENCOUNTER — Encounter
Admission: RE | Disposition: A | Discharge status: Home / Self Care | Payer: Self-pay | Source: Ambulatory Visit | Attending: Unknown Physician Specialty

## 2017-08-24 ENCOUNTER — Ambulatory Visit
Admission: RE | Admit: 2017-08-24 | Discharge: 2017-08-24 | Disposition: A | Discharge status: Home / Self Care | Payer: Medicare Other | Source: Ambulatory Visit | Attending: Unknown Physician Specialty | Admitting: Unknown Physician Specialty

## 2017-08-24 ENCOUNTER — Encounter: Payer: Self-pay | Admitting: Anesthesiology

## 2017-08-24 ENCOUNTER — Ambulatory Visit: Payer: Medicare Other | Admitting: Anesthesiology

## 2017-08-24 DIAGNOSIS — I252 Old myocardial infarction: Secondary | ICD-10-CM | POA: Diagnosis not present

## 2017-08-24 DIAGNOSIS — Z7982 Long term (current) use of aspirin: Secondary | ICD-10-CM | POA: Insufficient documentation

## 2017-08-24 DIAGNOSIS — Z79899 Other long term (current) drug therapy: Secondary | ICD-10-CM | POA: Insufficient documentation

## 2017-08-24 DIAGNOSIS — I2510 Atherosclerotic heart disease of native coronary artery without angina pectoris: Secondary | ICD-10-CM | POA: Insufficient documentation

## 2017-08-24 DIAGNOSIS — D125 Benign neoplasm of sigmoid colon: Secondary | ICD-10-CM | POA: Insufficient documentation

## 2017-08-24 DIAGNOSIS — Z951 Presence of aortocoronary bypass graft: Secondary | ICD-10-CM | POA: Insufficient documentation

## 2017-08-24 DIAGNOSIS — K2970 Gastritis, unspecified, without bleeding: Secondary | ICD-10-CM | POA: Insufficient documentation

## 2017-08-24 DIAGNOSIS — I255 Ischemic cardiomyopathy: Secondary | ICD-10-CM | POA: Diagnosis not present

## 2017-08-24 DIAGNOSIS — F419 Anxiety disorder, unspecified: Secondary | ICD-10-CM | POA: Insufficient documentation

## 2017-08-24 DIAGNOSIS — Z95810 Presence of automatic (implantable) cardiac defibrillator: Secondary | ICD-10-CM | POA: Insufficient documentation

## 2017-08-24 DIAGNOSIS — Z8711 Personal history of peptic ulcer disease: Secondary | ICD-10-CM | POA: Diagnosis not present

## 2017-08-24 DIAGNOSIS — D649 Anemia, unspecified: Secondary | ICD-10-CM | POA: Diagnosis not present

## 2017-08-24 DIAGNOSIS — L409 Psoriasis, unspecified: Secondary | ICD-10-CM | POA: Insufficient documentation

## 2017-08-24 DIAGNOSIS — Z87891 Personal history of nicotine dependence: Secondary | ICD-10-CM | POA: Insufficient documentation

## 2017-08-24 DIAGNOSIS — Z86010 Personal history of colonic polyps: Secondary | ICD-10-CM | POA: Diagnosis not present

## 2017-08-24 DIAGNOSIS — Z1211 Encounter for screening for malignant neoplasm of colon: Secondary | ICD-10-CM | POA: Diagnosis present

## 2017-08-24 DIAGNOSIS — K210 Gastro-esophageal reflux disease with esophagitis: Secondary | ICD-10-CM | POA: Insufficient documentation

## 2017-08-24 DIAGNOSIS — K5521 Angiodysplasia of colon with hemorrhage: Secondary | ICD-10-CM | POA: Diagnosis not present

## 2017-08-24 DIAGNOSIS — K640 First degree hemorrhoids: Secondary | ICD-10-CM | POA: Insufficient documentation

## 2017-08-24 DIAGNOSIS — I509 Heart failure, unspecified: Secondary | ICD-10-CM | POA: Insufficient documentation

## 2017-08-24 HISTORY — DX: Anxiety disorder, unspecified: F41.9

## 2017-08-24 HISTORY — PX: ESOPHAGOGASTRODUODENOSCOPY (EGD) WITH PROPOFOL: SHX5813

## 2017-08-24 HISTORY — DX: Anemia, unspecified: D64.9

## 2017-08-24 HISTORY — PX: COLONOSCOPY WITH PROPOFOL: SHX5780

## 2017-08-24 SURGERY — ESOPHAGOGASTRODUODENOSCOPY (EGD) WITH PROPOFOL
Anesthesia: General

## 2017-08-24 MED ORDER — EPHEDRINE SULFATE 50 MG/ML IJ SOLN
INTRAMUSCULAR | Status: DC | PRN
  Administered 2017-08-24: 5 mg via INTRAVENOUS
  Administered 2017-08-24 (×3): 10 mg via INTRAVENOUS

## 2017-08-24 MED ORDER — LIDOCAINE HCL (CARDIAC) 20 MG/ML IV SOLN
INTRAVENOUS | Status: DC | PRN
  Administered 2017-08-24: 30 mg via INTRAVENOUS

## 2017-08-24 MED ORDER — MIDAZOLAM HCL 2 MG/2ML IJ SOLN
INTRAMUSCULAR | Status: AC
  Filled 2017-08-24: qty 2

## 2017-08-24 MED ORDER — PROPOFOL 500 MG/50ML IV EMUL
INTRAVENOUS | Status: AC
  Filled 2017-08-24: qty 50

## 2017-08-24 MED ORDER — FENTANYL CITRATE (PF) 100 MCG/2ML IJ SOLN
INTRAMUSCULAR | Status: DC | PRN
  Administered 2017-08-24: 50 ug via INTRAVENOUS

## 2017-08-24 MED ORDER — MIDAZOLAM HCL 2 MG/2ML IJ SOLN
INTRAMUSCULAR | Status: DC | PRN
  Administered 2017-08-24: 1 mg via INTRAVENOUS

## 2017-08-24 MED ORDER — SODIUM CHLORIDE 0.9 % IV SOLN: INTRAVENOUS | Status: DC

## 2017-08-24 MED ORDER — PROPOFOL 500 MG/50ML IV EMUL
INTRAVENOUS | Status: DC | PRN
  Administered 2017-08-24: 100 ug/kg/min via INTRAVENOUS

## 2017-08-24 MED ORDER — SODIUM CHLORIDE 0.9 % IV SOLN
INTRAVENOUS | Status: DC
  Administered 2017-08-24: 15:00:00 via INTRAVENOUS

## 2017-08-24 MED ORDER — PHENYLEPHRINE HCL 10 MG/ML IJ SOLN
INTRAMUSCULAR | Status: AC
  Filled 2017-08-24: qty 1

## 2017-08-24 MED ORDER — PHENYLEPHRINE HCL 10 MG/ML IJ SOLN
INTRAMUSCULAR | Status: DC | PRN
  Administered 2017-08-24 (×5): 100 ug via INTRAVENOUS
  Administered 2017-08-24: 50 ug via INTRAVENOUS

## 2017-08-24 MED ORDER — LIDOCAINE HCL (PF) 2 % IJ SOLN
INTRAMUSCULAR | Status: AC
  Filled 2017-08-24: qty 10

## 2017-08-24 MED ORDER — FENTANYL CITRATE (PF) 100 MCG/2ML IJ SOLN
INTRAMUSCULAR | Status: AC
  Filled 2017-08-24: qty 2

## 2017-08-24 MED ORDER — EPHEDRINE SULFATE 50 MG/ML IJ SOLN
INTRAMUSCULAR | Status: AC
  Filled 2017-08-24: qty 1

## 2017-08-24 NOTE — Anesthesia Preprocedure Evaluation (Addendum)
Anesthesia Evaluation  Patient identified by MRN, date of birth, ID band Patient awake    Reviewed: Allergy & Precautions, NPO status , Patient's Chart, lab work & pertinent test results  History of Anesthesia Complications Negative for: history of anesthetic complications  Airway Mallampati: II       Dental   Pulmonary asthma , neg sleep apnea, neg COPD, former smoker,           Cardiovascular + Past MI and +CHF  + Cardiac Defibrillator      Neuro/Psych neg Seizures Anxiety    GI/Hepatic Neg liver ROS, hiatal hernia, PUD, GERD  Medicated,  Endo/Other  neg diabetes (pt denies)  Renal/GU negative Renal ROS     Musculoskeletal   Abdominal   Peds  Hematology  (+) anemia ,   Anesthesia Other Findings He did have q's in 2017.  Reproductive/Obstetrics                            Anesthesia Physical Anesthesia Plan  ASA: III  Anesthesia Plan: General   Post-op Pain Management:    Induction:   PONV Risk Score and Plan: 2 and TIVA and Propofol infusion  Airway Management Planned: Nasal Cannula  Additional Equipment:   Intra-op Plan:   Post-operative Plan:   Informed Consent: I have reviewed the patients History and Physical, chart, labs and discussed the procedure including the risks, benefits and alternatives for the proposed anesthesia with the patient or authorized representative who has indicated his/her understanding and acceptance.     Plan Discussed with:   Anesthesia Plan Comments:         Anesthesia Quick Evaluation

## 2017-08-24 NOTE — Op Note (Signed)
[facility] Gastroenterology Patient Name: Carl [name] Procedure Date: 08/24/2017 3:49 PM MRN: [id number] Account #: [id number] Date of Birth: September 08, 1950 Admit Type: Outpatient Age: 66 Room: [facility] ENDO ROOM 3 Gender: Male Note Status: Finalized Procedure:            Colonoscopy Indications:          High risk colon cancer surveillance: Personal history                        of colonic polyps Providers:            [name], MD Referring MD:         [name], MD (Referring MD) Medicines:            Propofol per Anesthesia Complications:        No immediate complications. Procedure:            Pre-Anesthesia Assessment:                       - After reviewing the risks and benefits, the patient                        was deemed in satisfactory condition to undergo the                        procedure.                       After obtaining informed consent, the colonoscope was                        passed under direct vision. Throughout the procedure,                        the patient's blood pressure, pulse, and oxygen                        saturations were monitored continuously. The Olympus                        PCF-H180AL colonoscope ( S#: [id number] ) was introduced                        through the anus and advanced to the the cecum,                        identified by appendiceal orifice and ileocecal valve.                        The colonoscopy was somewhat difficult due to a                        tortuous colon. Successful completion of the procedure                        was aided by applying abdominal pressure. The patient                        tolerated the procedure well. The quality of the bowel  preparation was good. Findings:      A small polyp was found in the sigmoid colon. The polyp was sessile. The       polyp was removed with a hot snare. Resection and retrieval were       complete.  Coagulation for tissue destruction using argon plasma at 0.4       liters/minute and 20 watts was successful. To lessen the chance of       bleeding post intervention      two hemostatic clips were successfully placed. There was no bleeding at       the end of the procedure.      Internal hemorrhoids were found during endoscopy. The hemorrhoids were       small and Grade I (internal hemorrhoids that do not prolapse). Impression:           - One small polyp in the sigmoid colon, removed with a                        hot snare. Resected and retrieved.                       - A single bleeding colonic angioectasia. Treated with                        argon plasma coagulation (APC).                       - A single bleeding colonic angioectasia. Clips were                        placed. Recommendation:       - Await pathology results. [name], MD 08/24/2017 5:06:15 PM This report has been signed electronically. Number of Addenda: 0 Note Initiated On: 08/24/2017 3:49 PM Scope Withdrawal Time: 0 hours 22 minutes 55 seconds  Total Procedure Duration: 0 hours 45 minutes 15 seconds       [facility]

## 2017-08-24 NOTE — Anesthesia Procedure Notes (Signed)
Performed by: Cook-Martin, Stephenia Vogan Pre-anesthesia Checklist: Patient identified, Emergency Drugs available, Suction available, Patient being monitored and Timeout performed Patient Re-evaluated:Patient Re-evaluated prior to induction Oxygen Delivery Method: Nasal cannula Preoxygenation: Pre-oxygenation with 100% oxygen Induction Type: IV induction Airway Equipment and Method: Bite block Placement Confirmation: positive ETCO2 and CO2 detector       

## 2017-08-24 NOTE — Transfer of Care (Signed)
Immediate Anesthesia Transfer of Care Note  Patient: Carl [name]  Procedure(s) Performed: ESOPHAGOGASTRODUODENOSCOPY (EGD) WITH PROPOFOL (N/A ) COLONOSCOPY WITH PROPOFOL (N/A )  Patient Location: PACU  Anesthesia Type:General  Level of Consciousness: awake and sedated  Airway & Oxygen Therapy: Patient Spontanous Breathing and Patient connected to nasal cannula oxygen  Post-op Assessment: Report given to RN and Post -op Vital signs reviewed and stable  Post vital signs: Reviewed and stable  Last Vitals:  Vitals:   08/24/17 1410  BP: 101/67  Pulse: 74  Resp: 16  Temp: (!) 35.6 C  SpO2: 100%    Last Pain:  Vitals:   08/24/17 1410  TempSrc: Tympanic         Complications: No apparent anesthesia complications

## 2017-08-24 NOTE — Anesthesia Post-op Follow-up Note (Signed)
Anesthesia QCDR form completed.        

## 2017-08-24 NOTE — Anesthesia Postprocedure Evaluation (Signed)
Anesthesia Post Note  Patient: Carl [name]  Procedure(s) Performed: ESOPHAGOGASTRODUODENOSCOPY (EGD) WITH PROPOFOL (N/A ) COLONOSCOPY WITH PROPOFOL (N/A )  Patient location during evaluation: Endoscopy Anesthesia Type: General Level of consciousness: awake and alert Pain management: pain level controlled Vital Signs Assessment: post-procedure vital signs reviewed and stable Respiratory status: spontaneous breathing and respiratory function stable Cardiovascular status: stable Anesthetic complications: no     Last Vitals:  Vitals:   08/24/17 1410 08/24/17 1657  BP: 101/67 (!) 83/53  Pulse: 74 71  Resp: 16 16  Temp: (!) 35.6 C (!) 36.2 C  SpO2: 100% 100%    Last Pain:  Vitals:   08/24/17 1657  TempSrc: Tympanic                 KEPHART,WILLIAM K

## 2017-08-24 NOTE — H&P (Signed)
Primary Care Physician:  [name], MD Primary Gastroenterologist:  Dr. Vira Agar  Pre-Procedure History & Physical: HPI:  Carl [name] is a 66 y.o. male is here for an endoscopy and colonoscopy.   Past Medical History:  Diagnosis Date  . Anemia   . Anxiety   . Arthritis   . Brunner's gland hyperplasia of duodenum   . CHF (congestive heart failure) ([place])   . Coronary artery disease   . External hemorrhoids   . GERD (gastroesophageal reflux disease)   . Hearing loss   . HH (hiatus hernia)   . Internal hemorrhoids   . Ischemic cardiomyopathy   . Leg fracture, left   . Myocardial infarction ([place])   . Paronychia   . Psoriasis   . PUD (peptic ulcer disease)   . Schatzki's ring   . Tubular adenoma of colon   . Vitiligo     Past Surgical History:  Procedure Laterality Date  . CARDIAC SURGERY    . COLONOSCOPY WITH ESOPHAGOGASTRODUODENOSCOPY (EGD)    . CORONARY ARTERY BYPASS GRAFT N/A 08/03/2014   Procedure: CORONARY ARTERY BYPASS GRAFTING (CABG);  Surgeon: [name], MD;  Location: [facility];  Service: Open Heart Surgery;  Laterality: N/A;  . EP IMPLANTABLE DEVICE N/A 05/01/2015   MDT ICD implanted for primary prevention of sudden death  . ESOPHAGOGASTRODUODENOSCOPY (EGD) WITH PROPOFOL N/A 04/16/2015   Procedure: ESOPHAGOGASTRODUODENOSCOPY (EGD) WITH PROPOFOL;  Surgeon: [name], MD;  Location: [facility] ENDOSCOPY;  Service: Endoscopy;  Laterality: N/A;  . FRACTURE SURGERY    . HERNIA REPAIR    . TEE WITHOUT CARDIOVERSION N/A 08/03/2014   Procedure: TRANSESOPHAGEAL ECHOCARDIOGRAM (TEE);  Surgeon: [name], MD;  Location: [facility];  Service: Open Heart Surgery;  Laterality: N/A;    Prior to Admission medications   Medication Sig Start Date End Date Taking? Authorizing Provider  aluminum-magnesium hydroxide-simethicone (MAALOX) 010-932-35 MG/5ML SUSP Take 30 mLs by mouth 4 (four) times daily -  before meals and at bedtime. 08/13/17  Yes Carrie Mew, MD   aspirin 81 MG tablet Take 81 mg by mouth 2 (two) times daily.   Yes [provider]  atorvastatin (LIPITOR) 40 MG tablet Take 1 tablet (40 mg total) by mouth daily at 6 PM. 08/15/14  Yes Gold, [name], PA-C  carvedilol (COREG) 3.125 MG tablet Take 3.125 mg by mouth 2 (two) times daily with a meal.   Yes [provider]  citalopram (CELEXA) 20 MG tablet Take 1 tablet by mouth daily. 07/09/16  Yes [provider]  cyanocobalamin 500 MCG tablet Take 500 mcg by mouth daily.   Yes [provider]  eplerenone (INSPRA) 25 MG tablet Take 1 tablet (25 mg total) by mouth daily. 06/19/17  Yes Arbutus Leas, NP  ferrous sulfate 325 (65 FE) MG tablet Take 325 mg by mouth daily with breakfast.   Yes [provider]  furosemide (LASIX) 40 MG tablet Taking 2 tablets (80 mg) every AM and 40 mg in the PM 07/28/17  Yes [name], MD  losartan (COZAAR) 25 MG tablet Take 0.5 tablets (12.5 mg total) daily by mouth. 07/04/17  Yes Theora Gianotti, NP  omeprazole (PRILOSEC) 20 MG capsule Take 20 mg by mouth 2 (two) times daily.   Yes [provider]  potassium chloride SA (KLOR-CON M20) 20 MEQ tablet Take 1 tablet (20 mEq total) 2 (two) times daily by mouth. 07/01/17  Yes [name], MD  meloxicam (MOBIC) 7.5  MG tablet Take 7.5 mg daily by mouth. Brother reported patient is no longer taking.    [provider]    Allergies as of 08/07/2017 - Review Complete 07/02/2017  Allergen Reaction Noted  . Spironolactone  02/20/2017    Family History  Problem Relation Age of Onset  . Valvular heart disease Mother        Ruptured valve  . CAD Father   . Heart Problems Brother        Stents x 4  . Diabetes Brother   . Prostate cancer Neg Hx   . Bladder Cancer Neg Hx   . Kidney cancer Neg Hx     Social History   Socioeconomic History  . Marital status: Single    Spouse name: Not on file  . Number of children: Not on file  . Years of  education: Not on file  . Highest education level: Not on file  Social Needs  . Financial resource strain: Not on file  . Food insecurity - worry: Not on file  . Food insecurity - inability: Not on file  . Transportation needs - medical: Not on file  . Transportation needs - non-medical: Not on file  Occupational History  . Not on file  Tobacco Use  . Smoking status: Former Smoker    Last attempt to quit: 2014    Years since quitting: 5.0  . Smokeless tobacco: Never Used  . Tobacco comment: 07/30/2017 Quit in 2014  Substance and Sexual Activity  . Alcohol use: No    Alcohol/week: 0.0 oz  . Drug use: No  . Sexual activity: No  Other Topics Concern  . Not on file  Social History Narrative   Pt lives in [place] with his mother.   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 Neck:  Supple; no masses or thyromegaly. Lungs:  Clear throughout to auscultation.    Heart:  Regular rate and rhythm. Abdomen:  Soft, nontender and nondistended. Normal bowel sounds, without guarding, and without rebound.   Neurologic:  Alert and  oriented x4;  grossly normal neurologically.  Impression/Plan: Carl [name] is here for an endoscopy and colonoscopy to be performed for anemia and previous colon polyp.  Risks, benefits, limitations, and alternatives regarding  endoscopy and colonoscopy have been reviewed with the patient.  Questions have been answered.  All parties agreeable.   Gaylyn Cheers, MD  08/24/2017, 3:45 PM

## 2017-08-24 NOTE — Brief Op Note (Signed)
Colon polyp hot snared but pulled out through the scope with bx forcep, per MD.

## 2017-08-24 NOTE — Op Note (Signed)
[facility] Gastroenterology Patient Name: Carl [name] Procedure Date: 08/24/2017 3:51 PM MRN: [id number] Account #: [id number] Date of Birth: Oct 01, 1950 Admit Type: Outpatient Age: 66 Room: [facility] ENDO ROOM 3 Gender: Male Note Status: Finalized Procedure:            Upper GI endoscopy Indications:          Heartburn Providers:            [name], MD Referring MD:         [name], MD (Referring MD) Medicines:            Propofol per Anesthesia Complications:        No immediate complications. Procedure:            Pre-Anesthesia Assessment:                       - After reviewing the risks and benefits, the patient                        was deemed in satisfactory condition to undergo the                        procedure.                       After obtaining informed consent, the endoscope was                        passed under direct vision. Throughout the procedure,                        the patient's blood pressure, pulse, and oxygen                        saturations were monitored continuously. The Endoscope                        was introduced through the mouth, and advanced to the                        second part of duodenum. The upper GI endoscopy was                        accomplished without difficulty. The patient tolerated                        the procedure well. Findings:      LA Grade A (one or more mucosal breaks less than 5 mm, not extending       between tops of 2 mucosal folds) esophagitis with no bleeding was found       40 cm from the incisors. Biopsies were taken with a cold forceps for       histology.      Patchy mild inflammation characterized by erythema and granularity was       found in the gastric body and in the gastric antrum. Biopsies were taken       with a cold forceps for histology. Biopsies were taken with a cold       forceps for Helicobacter pylori testing. Impression:           - LA Grade A  reflux esophagitis. Rule out Barrett's                        esophagus. Biopsied.                       - Gastritis. Biopsied. Recommendation:       - Await pathology results. [name], MD 08/24/2017 4:08:49 PM This report has been signed electronically. Number of Addenda: 0 Note Initiated On: 08/24/2017 3:51 PM      [facility]

## 2017-08-26 ENCOUNTER — Encounter: Payer: Medicare Other | Attending: Cardiology | Admitting: *Deleted

## 2017-08-26 ENCOUNTER — Encounter: Payer: Self-pay | Admitting: Unknown Physician Specialty

## 2017-08-26 ENCOUNTER — Encounter: Payer: Self-pay | Admitting: *Deleted

## 2017-08-26 DIAGNOSIS — I5022 Chronic systolic (congestive) heart failure: Secondary | ICD-10-CM

## 2017-08-26 DIAGNOSIS — Z7982 Long term (current) use of aspirin: Secondary | ICD-10-CM | POA: Insufficient documentation

## 2017-08-26 DIAGNOSIS — Z79899 Other long term (current) drug therapy: Secondary | ICD-10-CM | POA: Diagnosis not present

## 2017-08-26 DIAGNOSIS — Z87891 Personal history of nicotine dependence: Secondary | ICD-10-CM | POA: Insufficient documentation

## 2017-08-26 NOTE — Progress Notes (Signed)
Cardiac Individual Treatment Plan  Patient Details  Name: YUSUF YU MRN: [id number] Date of Birth: 1951/08/18 Referring Provider:     Cardiac Rehab from 07/30/2017 in [facility] Cardiac and Pulmonary Rehab  Referring Provider  [name]      Initial Encounter Date:    Cardiac Rehab from 07/30/2017 in [name] Cardiac and Pulmonary Rehab  Date  07/30/17  Referring Provider  [name]      Visit Diagnosis: Heart failure, chronic systolic ([place])  Patient's Home Medications on Admission:  Current Outpatient Medications:  .  aluminum-magnesium hydroxide-simethicone (MAALOX) 200-200-20 MG/5ML SUSP, Take 30 mLs by mouth 4 (four) times daily -  before meals and at bedtime., Disp: 355 mL, Rfl: 0 .  aspirin 81 MG tablet, Take 81 mg by mouth 2 (two) times daily., Disp: , Rfl:  .  atorvastatin (LIPITOR) 40 MG tablet, Take 1 tablet (40 mg total) by mouth daily at 6 PM., Disp: 30 tablet, Rfl: 1 .  carvedilol (COREG) 3.125 MG tablet, Take 3.125 mg by mouth 2 (two) times daily with a meal., Disp: , Rfl:  .  citalopram (CELEXA) 20 MG tablet, Take 1 tablet by mouth daily., Disp: , Rfl: 5 .  cyanocobalamin 500 MCG tablet, Take 500 mcg by mouth daily., Disp: , Rfl:  .  eplerenone (INSPRA) 25 MG tablet, Take 1 tablet (25 mg total) by mouth daily., Disp: 30 tablet, Rfl: 6 .  ferrous sulfate 325 (65 FE) MG tablet, Take 325 mg by mouth daily with breakfast., Disp: , Rfl:  .  furosemide (LASIX) 40 MG tablet, Taking 2 tablets (80 mg) every AM and 40 mg in the PM, Disp: 30 tablet, Rfl: 3 .  losartan (COZAAR) 25 MG tablet, Take 0.5 tablets (12.5 mg total) daily by mouth., Disp: , Rfl:  .  meloxicam (MOBIC) 7.5 MG tablet, Take 7.5 mg daily by mouth. Brother reported patient is no longer taking., Disp: , Rfl:  .  omeprazole (PRILOSEC) 20 MG capsule, Take 20 mg by mouth 2 (two) times daily., Disp: , Rfl:  .  potassium chloride SA (KLOR-CON M20) 20 MEQ tablet, Take 1 tablet (20 mEq total) 2 (two) times daily by mouth., Disp: 60  tablet, Rfl: 3  Past Medical History: Past Medical History:  Diagnosis Date  . Anemia   . Anxiety   . Arthritis   . Brunner's gland hyperplasia of duodenum   . CHF (congestive heart failure) ([place])   . Coronary artery disease   . External hemorrhoids   . GERD (gastroesophageal reflux disease)   . Hearing loss   . HH (hiatus hernia)   . Internal hemorrhoids   . Ischemic cardiomyopathy   . Leg fracture, left   . Myocardial infarction ([place])   . Paronychia   . Psoriasis   . PUD (peptic ulcer disease)   . Schatzki's ring   . Tubular adenoma of colon   . Vitiligo     Tobacco Use: Social History   Tobacco Use  Smoking Status Former Smoker  . Last attempt to quit: 2014  . Years since quitting: 5.0  Smokeless Tobacco Never Used  Tobacco Comment   07/30/2017 Quit in 2014    Labs: Recent Review Flowsheet Data    Labs for ITP Cardiac and Pulmonary Rehab Latest Ref Rng & Units 08/13/2014 08/14/2014 08/15/2014 11/07/2014 06/11/2017   Cholestrol 0 - 200 mg/dL - - - 109 -   LDLCALC 0 - 99 mg/dL - - - 62 -   HDL >39 mg/dL - - -  40 -   Trlycerides <150 mg/dL - - - 34 -   Hemoglobin A1c 4.8 - 5.6 % - - - - 5.3   PHART 7.350 - 7.450 - - - - -   PCO2ART 35.0 - 45.0 mmHg - - - - -   HCO3 20.0 - 24.0 mEq/L - - - - -   TCO2 0 - 100 mmol/L - - - - -   ACIDBASEDEF 0.0 - 2.0 mmol/L - - - - -   O2SAT % 59.3 80.0 64.5 - -       Exercise Target Goals:    Exercise Program Goal: Individual exercise prescription set with THRR, safety & activity barriers. Participant demonstrates ability to understand and report RPE using BORG scale, to self-measure pulse accurately, and to acknowledge the importance of the exercise prescription.  Exercise Prescription Goal: Starting with aerobic activity 30 plus minutes a day, 3 days per week for initial exercise prescription. Provide home exercise prescription and guidelines that participant acknowledges understanding prior to discharge.  Activity  Barriers & Risk Stratification: Activity Barriers & Cardiac Risk Stratification - 07/30/17 1323      Activity Barriers & Cardiac Risk Stratification   Activity Barriers  Back Problems;Other (comment)    Comments  muscle aches   arms and back    Cardiac Risk Stratification  High       6 Minute Walk: 6 Minute Walk    Row Name 07/30/17 1434         6 Minute Walk   Distance  1425 feet     Walk Time  6 minutes     # of Rest Breaks  0     MPH  2.7     METS  2.93     RPE  15     Perceived Dyspnea   0     VO2 Peak  10.26     Symptoms  Yes (comment)     Comments  L leg broken twice and hurts     Resting HR  70 bpm     Resting BP  92/60     Max Ex. HR  84 bpm     Max Ex. BP  118/64     2 Minute Post BP  106/64        Oxygen Initial Assessment:   Oxygen Re-Evaluation:   Oxygen Discharge (Final Oxygen Re-Evaluation):   Initial Exercise Prescription: Initial Exercise Prescription - 07/30/17 1400      Date of Initial Exercise RX and Referring Provider   Date  07/30/17    Referring Provider  [name]      Treadmill   MPH  2.3    Grade  0.5    Minutes  15    METs  2.92      Recumbant Bike   Level  2    RPM  60    Watts  20    Minutes  15    METs  2.9      NuStep   Level  3    SPM  80    Minutes  15    METs  2.9      Prescription Details   Frequency (times per week)  3    Duration  Progress to 45 minutes of aerobic exercise without signs/symptoms of physical distress      Intensity   THRR 40-80% of Max Heartrate  98-126    Ratings of Perceived Exertion  11-13  Perceived Dyspnea  0-4      Resistance Training   Training Prescription  Yes    Weight  3 lb    Reps  10-15       Perform Capillary Blood Glucose checks as needed.  Exercise Prescription Changes: Exercise Prescription Changes    Row Name 07/30/17 1250 08/19/17 1100           Response to Exercise   Blood Pressure (Admit)  92/60  104/62      Blood Pressure (Exercise)  118/64  122/72       Blood Pressure (Exit)  106/64  118/70      Heart Rate (Admit)  70 bpm  80 bpm      Heart Rate (Exercise)  105 bpm  110 bpm      Heart Rate (Exit)  74 bpm  92 bpm      Rating of Perceived Exertion (Exercise)  -  13      Symptoms  -  none      Duration  -  Progress to 45 minutes of aerobic exercise without signs/symptoms of physical distress      Intensity  -  THRR unchanged        Progression   Progression  -  Continue to progress workloads to maintain intensity without signs/symptoms of physical distress.      Average METs  -  2.93        Resistance Training   Training Prescription  -  Yes      Weight  -  3 lb      Reps  -  10-15        Interval Training   Interval Training  -  No        Treadmill   MPH  -  2.3      Grade  -  0.5      Minutes  -  15      METs  -  3.08        Recumbant Bike   Level  -  2      RPM  -  60      Watts  -  28      Minutes  -  15      METs  -  2.77         Exercise Comments: Exercise Comments    Row Name 08/10/17 1623           Exercise Comments  First full day of exercise!  Patient was oriented to gym and equipment including functions, settings, policies, and procedures.  Patient's individual exercise prescription and treatment plan were reviewed.  All starting workloads were established based on the results of the 6 minute walk test done at initial orientation visit.  The plan for exercise progression was also introduced and progression will be customized based on patient's performance and goals          Exercise Goals and Review: Exercise Goals    Row Name 07/30/17 1434             Exercise Goals   Increase Physical Activity  Yes       Intervention  Provide advice, education, support and counseling about physical activity/exercise needs.;Develop an individualized exercise prescription for aerobic and resistive training based on initial evaluation findings, risk stratification, comorbidities and participant's personal goals.        Expected Outcomes  Achievement of increased cardiorespiratory fitness and enhanced flexibility, muscular  endurance and strength shown through measurements of functional capacity and personal statement of participant.       Increase Strength and Stamina  Yes       Intervention  Provide advice, education, support and counseling about physical activity/exercise needs.;Develop an individualized exercise prescription for aerobic and resistive training based on initial evaluation findings, risk stratification, comorbidities and participant's personal goals.       Expected Outcomes  Achievement of increased cardiorespiratory fitness and enhanced flexibility, muscular endurance and strength shown through measurements of functional capacity and personal statement of participant.       Able to understand and use rate of perceived exertion (RPE) scale  Yes       Intervention  Provide education and explanation on how to use RPE scale       Expected Outcomes  Short Term: Able to use RPE daily in rehab to express subjective intensity level;Long Term:  Able to use RPE to guide intensity level when exercising independently       Able to understand and use Dyspnea scale  Yes       Intervention  Provide education and explanation on how to use Dyspnea scale       Expected Outcomes  Short Term: Able to use Dyspnea scale daily in rehab to express subjective sense of shortness of breath during exertion;Long Term: Able to use Dyspnea scale to guide intensity level when exercising independently       Knowledge and understanding of Target Heart Rate Range (THRR)  Yes       Intervention  Provide education and explanation of THRR including how the numbers were predicted and where they are located for reference       Expected Outcomes  Short Term: Able to state/look up THRR;Long Term: Able to use THRR to govern intensity when exercising independently;Short Term: Able to use daily as guideline for intensity in rehab       Able  to check pulse independently  Yes       Intervention  Provide education and demonstration on how to check pulse in carotid and radial arteries.;Review the importance of being able to check your own pulse for safety during independent exercise       Expected Outcomes  Short Term: Able to explain why pulse checking is important during independent exercise;Long Term: Able to check pulse independently and accurately       Understanding of Exercise Prescription  Yes          Exercise Goals Re-Evaluation :   Discharge Exercise Prescription (Final Exercise Prescription Changes): Exercise Prescription Changes - 08/19/17 1100      Response to Exercise   Blood Pressure (Admit)  104/62    Blood Pressure (Exercise)  122/72    Blood Pressure (Exit)  118/70    Heart Rate (Admit)  80 bpm    Heart Rate (Exercise)  110 bpm    Heart Rate (Exit)  92 bpm    Rating of Perceived Exertion (Exercise)  13    Symptoms  none    Duration  Progress to 45 minutes of aerobic exercise without signs/symptoms of physical distress    Intensity  THRR unchanged      Progression   Progression  Continue to progress workloads to maintain intensity without signs/symptoms of physical distress.    Average METs  2.93      Resistance Training   Training Prescription  Yes    Weight  3 lb    Reps  10-15      Interval Training   Interval Training  No      Treadmill   MPH  2.3    Grade  0.5    Minutes  15    METs  3.08      Recumbant Bike   Level  2    RPM  60    Watts  28    Minutes  15    METs  2.77       Nutrition:  Target Goals: Understanding of nutrition guidelines, daily intake of sodium <15105m, cholesterol <2073m calories 30% from fat and 7% or less from saturated fats, daily to have 5 or more servings of fruits and vegetables.  Biometrics: Pre Biometrics - 07/30/17 1433      Pre Biometrics   Height  5' 11.75" (1.822 m)    Weight  175 lb 8 oz (79.6 kg)    Waist Circumference  37.5 inches    Hip  Circumference  42 inches    Waist to Hip Ratio  0.89 %    BMI (Calculated)  23.98    Single Leg Stand  3.04 seconds        Nutrition Therapy Plan and Nutrition Goals: Nutrition Therapy & Goals - 07/30/17 1324      Nutrition Therapy   RD appointment defered  -- Deferred at present time.  Will reach out agian to family members that cook [employer] home meals.,to see if they would be interested in talking with RD about Heart Failure nutrition guidelines.       Intervention Plan   Intervention  Prescribe, educate and counsel regarding individualized specific dietary modifications aiming towards targeted core components such as weight, hypertension, lipid management, diabetes, heart failure and other comorbidities.    Expected Outcomes  Short Term Goal: Understand basic principles of dietary content, such as calories, fat, sodium, cholesterol and nutrients.;Long Term Goal: Adherence to prescribed nutrition plan.       Nutrition Discharge: Rate Your Plate Scores:   Nutrition Goals Re-Evaluation:   Nutrition Goals Discharge (Final Nutrition Goals Re-Evaluation):   Psychosocial: Target Goals: Acknowledge presence or absence of significant depression and/or stress, maximize coping skills, provide positive support system. Participant is able to verbalize types and ability to use techniques and skills needed for reducing stress and depression.   Initial Review & Psychosocial Screening: Initial Psych Review & Screening - 07/30/17 1326      Initial Review   Current issues with  None Identified      Family Dynamics   Good Support System?  Yes Sister and Brother help JeThad  Concerns  Recent loss of significant other    Comments  Mother passed away this year.        Barriers   Psychosocial barriers to participate in program  There are no identifiable barriers or psychosocial needs.;The patient should benefit from training in stress management and relaxation.      Screening Interventions    Interventions  Yes;Encouraged to exercise    Expected Outcomes  Short Term goal: Utilizing psychosocial counselor, staff and physician to assist with identification of specific Stressors or current issues interfering with healing process. Setting desired goal for each stressor or current issue identified.;Long Term Goal: Stressors or current issues are controlled or eliminated.;Short Term goal: Identification and review with participant of any Quality of Life or Depression concerns found by scoring the questionnaire.;Long Term goal: The participant improves quality of Life and PHQ9 Scores as seen by  post scores and/or verbalization of changes       Quality of Life Scores:  Quality of Life - 07/30/17 1327      Quality of Life Scores   Health/Function Pre  21 %    Socioeconomic Pre  20.67 %    Psych/Spiritual Pre  21 %    Family Pre  21 %    GLOBAL Pre  20.94 %       PHQ-9: Recent Review Flowsheet Data    Depression screen [facility] 2/9 07/30/2017   Decreased Interest 0   Down, Depressed, Hopeless 0   PHQ - 2 Score 0   Altered sleeping 0   Tired, decreased energy 0   Change in appetite 0   Feeling bad or failure about yourself  0   Trouble concentrating 0   Moving slowly or fidgety/restless 0   Suicidal thoughts 0   PHQ-9 Score 0   Difficult doing work/chores Not difficult at all     Interpretation of Total Score  Total Score Depression Severity:  1-4 = Minimal depression, 5-9 = Mild depression, 10-14 = Moderate depression, 15-19 = Moderately severe depression, 20-27 = Severe depression   Psychosocial Evaluation and Intervention: Psychosocial Evaluation - 08/12/17 1708      Psychosocial Evaluation & Interventions   Comments  Mr. Anastasia returned to this program after 3 years.  Counselor met with him for an updated evaluation.  He is now 67 years old and reports his mother who was caring for him has passed away several months ago.  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  He states his sister and brother live close by and his aunt and uncle are coming in from [place] to be here with him over the holidays.  English states he sleeps well and has a good appetite.  He denies depression or anxiety but continues to be on medications typically given for treating these symptoms.  Jameek states his current stressors are he has a court appearance on 12/29 due to "DWI" having admitted to taking some pain pills to the officer.  Mackie has goals to get stronger and be able to do more normal things as his goals while in this program.      Expected Outcomes  Malekai will benefit from consistent exercise to achieve his stated goals.  The support from staff and other patients will be helpful for him during this time of loss over the holidays.  Staff will follow.     Continue Psychosocial Services   Follow up required by staff       Psychosocial Re-Evaluation:   Psychosocial Discharge (Final Psychosocial Re-Evaluation):   Vocational Rehabilitation: Provide vocational rehab assistance to qualifying candidates.   Vocational Rehab Evaluation & Intervention: Vocational Rehab - 07/30/17 1330      Initial Vocational Rehab Evaluation & Intervention   Assessment shows need for Vocational Rehabilitation  No       Education: Education Goals: Education classes will be provided on a variety of topics geared toward better understanding of heart health and risk factor modification. Participant will state understanding/return demonstration of topics presented as noted by education test scores.  Learning Barriers/Preferences: Learning Barriers/Preferences - 07/30/17 1329      Learning Barriers/Preferences   Learning Barriers  Reading;Inability to learn new things;Hearing Slow understanding     Learning Preferences  Audio;Individual Instruction;Verbal Instruction;Group Instruction       Education Topics: General Nutrition Guidelines/Fats and Fiber: -Group  instruction provided by verbal,  written material, models and posters to present the general guidelines for heart healthy nutrition. Gives an explanation and review of dietary fats and fiber.   Cardiac Rehab from 08/19/2017 in [facility] Cardiac and Pulmonary Rehab  Date  08/10/17  Educator  PI  Instruction Review Code  1- Verbalizes Understanding      Controlling Sodium/Reading Food Labels: -Group verbal and written material supporting the discussion of sodium use in heart healthy nutrition. Review and explanation with models, verbal and written materials for utilization of the food label.   Cardiac Rehab from 08/19/2017 in [facility] Cardiac and Pulmonary Rehab  Date  08/10/17  Educator  PI  Instruction Review Code  1- Verbalizes Understanding      Exercise Physiology & Risk Factors: - Group verbal and written instruction with models to review the exercise physiology of the cardiovascular system and associated critical values. Details cardiovascular disease risk factors and the goals associated with each risk factor.   Cardiac Rehab from 08/19/2017 in [facility] Cardiac and Pulmonary Rehab  Date  08/19/17  Educator  AS  Instruction Review Code  1- Verbalizes Understanding      Aerobic Exercise & Resistance Training: - Gives group verbal and written discussion on the health impact of inactivity. On the components of aerobic and resistive training programs and the benefits of this training and how to safely progress through these programs.   Cardiac Rehab from 02/19/2015 in [facility] Cardiac and Pulmonary Rehab  Date  01/17/15  Educator  RM  Instruction Review Code (retired)  2- Statistician, Balance, General Exercise Guidelines: - Provides group verbal and written instruction on the benefits of flexibility and balance training programs. Provides general exercise guidelines with specific guidelines to those with heart or lung disease. Demonstration and skill practice provided.    Cardiac Rehab from 02/19/2015 in [facility] Cardiac and Pulmonary Rehab  Date  01/15/15  Educator  SB  Instruction Review Code (retired)  2- meets goals/outcomes      Stress Management: - Provides group verbal and written instruction about the health risks of elevated stress, cause of high stress, and healthy ways to reduce stress.   Cardiac Rehab from 02/19/2015 in [facility] Cardiac and Pulmonary Rehab  Date  01/24/15  Educator  [facility]  Instruction Review Code (retired)  2- meets goals/outcomes      Depression: - Provides group verbal and written instruction on the correlation between heart/lung disease and depressed mood, treatment options, and the stigmas associated with seeking treatment.   Cardiac Rehab from 02/19/2015 in [facility] Cardiac and Pulmonary Rehab  Date  02/05/15  Educator  SB  Instruction Review Code (retired)  2- meets Designer, fashion/clothing & Physiology of the Heart: - Group verbal and written instruction and models provide basic cardiac anatomy and physiology, with the coronary electrical and arterial systems. Review of: AMI, Angina, Valve disease, Heart Failure, Cardiac Arrhythmia, Pacemakers, and the ICD.   Cardiac Rehab from 02/19/2015 in [facility] Cardiac and Pulmonary Rehab  Date  01/29/15  Educator  SB  Instruction Review Code (retired)  2- meets goals/outcomes      Cardiac Procedures: - Group verbal and written instruction to review commonly prescribed medications for heart disease. Reviews the medication, class of the drug, and side effects. Includes the steps to properly store meds and maintain the prescription regimen. (beta blockers and nitrates)   Cardiac Rehab from 02/19/2015 in [facility] Cardiac and Pulmonary Rehab  Date  01/03/15  Educator  [facility]  Instruction Review Code (retired)  2- meets goals/outcomes      Cardiac Medications I: - Group verbal and written instruction to review commonly prescribed medications for heart disease. Reviews the medication, class of the  drug, and side effects. Includes the steps to properly store meds and maintain the prescription regimen.   Cardiac Rehab from 02/19/2015 in [facility] Cardiac and Pulmonary Rehab  Date  01/08/15  Educator  Foster Simpson RN [facility]  Instruction Review Code (retired)  2- meets goals/outcomes      Cardiac Medications II: -Group verbal and written instruction to review commonly prescribed medications for heart disease. Reviews the medication, class of the drug, and side effects. (all other drug classes)    Go Sex-Intimacy & Heart Disease, Get SMART - Goal Setting: - Group verbal and written instruction through game format to discuss heart disease and the return to sexual intimacy. Provides group verbal and written material to discuss and apply goal setting through the application of the S.M.A.R.T. Method.   Cardiac Rehab from 02/19/2015 in [facility] Cardiac and Pulmonary Rehab  Date  01/03/15  Educator  [facility]  Instruction Review Code (retired)  2- meets goals/outcomes      Other Matters of the Heart: - Provides group verbal, written materials and models to describe Heart Failure, Angina, Valve Disease, Peripheral Artery Disease, and Diabetes in the realm of heart disease. Includes description of the disease process and treatment options available to the cardiac patient.   Cardiac Rehab from 02/19/2015 in [facility] Cardiac and Pulmonary Rehab  Date  02/07/15  Educator  CE  Instruction Review Code (retired)  2- meets goals/outcomes      Exercise & Equipment Safety: - Individual verbal instruction and demonstration of equipment use and safety with use of the equipment.   Cardiac Rehab from 08/19/2017 in [facility] Cardiac and Pulmonary Rehab  Date  07/30/17  Educator  [facility]  Instruction Review Code  1- Verbalizes Understanding      Infection Prevention: - Provides verbal and written material to individual with discussion of infection control including proper hand washing and proper equipment cleaning during exercise  session.   Cardiac Rehab from 08/19/2017 in [facility] Cardiac and Pulmonary Rehab  Date  07/30/17  Educator  [facility]  Instruction Review Code  1- Verbalizes Understanding      Falls Prevention: - Provides verbal and written material to individual with discussion of falls prevention and safety.   Cardiac Rehab from 08/19/2017 in [facility] Cardiac and Pulmonary Rehab  Date  07/30/17  Educator  [facility]  Instruction Review Code  1- Verbalizes Understanding      Diabetes: - Individual verbal and written instruction to review signs/symptoms of diabetes, desired ranges of glucose level fasting, after meals and with exercise. Acknowledge that pre and post exercise glucose checks will be done for 3 sessions at entry of program.   Other: -Provides group and verbal instruction on various topics (see comments)    Knowledge Questionnaire Score: Knowledge Questionnaire Score - 07/30/17 1330      Knowledge Questionnaire Score   Pre Score  22/28       Core Components/Risk Factors/Patient Goals at Admission: Personal Goals and Risk Factors at Admission - 07/30/17 1326      Core Components/Risk Factors/Patient Goals on Admission   Heart Failure  Yes    Intervention  Provide a combined exercise and nutrition program that is supplemented with education, support and counseling about heart failure. Directed toward relieving symptoms such as shortness  of breath, decreased exercise tolerance, and extremity edema.    Expected Outcomes  Improve functional capacity of life;Short term: Attendance in program 2-3 days a week with increased exercise capacity. Reported lower sodium intake. Reported increased fruit and vegetable intake. Reports medication compliance.;Short term: Daily weights obtained and reported for increase. Utilizing diuretic protocols set by physician.;Long term: Adoption of self-care skills and reduction of barriers for early signs and symptoms recognition and intervention leading to self-care maintenance.     Lipids  Yes    Intervention  Provide education and support for participant on nutrition & aerobic/resistive exercise along with prescribed medications to achieve LDL <67m, HDL >422m    Expected Outcomes  Short Term: Participant states understanding of desired cholesterol values and is compliant with medications prescribed. Participant is following exercise prescription and nutrition guidelines.;Long Term: Cholesterol controlled with medications as prescribed, with individualized exercise RX and with personalized nutrition plan. Value goals: LDL < 7027mHDL > 40 mg.       Core Components/Risk Factors/Patient Goals Review:    Core Components/Risk Factors/Patient Goals at Discharge (Final Review):    ITP Comments: ITP Comments    Row Name 07/30/17 1322 07/30/17 1330 08/26/17 1017       ITP Comments  Medical review completed today   ITP completed and sent for review, changes as needed and signature.   [facility] and 10/26 2018  Medication  list not reviewed to day. JerKierred not bring his notebook  with his current list. He stated he will bring it with him next visit.  30 day review. Continue with ITP unless directed changes per Medical Director review.         Comments:

## 2017-08-26 NOTE — Progress Notes (Signed)
Daily Session Note  Patient Details  Name: SHED NIXON MRN: [id number] Date of Birth: 1951/08/08 Referring Provider:     Cardiac Rehab from 07/30/2017 in [facility] Cardiac and Pulmonary Rehab  Referring Provider  [name]      Encounter Date: 08/26/2017  Check In: Session Check In - 08/26/17 1648      Check-In   Location  [facility]-Cardiac & Pulmonary Rehab    Staff Present  Renita Papa, RN Vickki Hearing, BA, ACSM CEP, Exercise Physiologist;Carroll Enterkin, RN, BSN    Supervising physician immediately available to respond to emergencies  See telemetry face sheet for immediately available ER MD    Medication changes reported      No    Fall or balance concerns reported     No    Warm-up and Cool-down  Performed on first and last piece of equipment    Resistance Training Performed  Yes    VAD Patient?  No      Pain Assessment   Currently in Pain?  No/denies          Social History   Tobacco Use  Smoking Status Former Smoker  . Last attempt to quit: 2014  . Years since quitting: 5.0  Smokeless Tobacco Never Used  Tobacco Comment   07/30/2017 Quit in 2014    Goals Met:  Independence with exercise equipment Exercise tolerated well No report of cardiac concerns or symptoms Strength training completed today  Goals Unmet:  Not Applicable  Comments: Pt able to follow exercise prescription today without complaint.  Will continue to monitor for progression.    Dr. Emily Filbert is Medical Director for [place] and [facility].

## 2017-08-27 DIAGNOSIS — I5022 Chronic systolic (congestive) heart failure: Secondary | ICD-10-CM

## 2017-08-27 NOTE — Progress Notes (Signed)
Daily Session Note  Patient Details  Name: Carl [name] MRN: [id number] Date of Birth: 1951/02/03 Referring Provider:     Cardiac Rehab from 07/30/2017 in [facility] Cardiac and Pulmonary Rehab  Referring Provider  [name]      Encounter Date: 08/27/2017  Check In: Session Check In - 08/27/17 1622      Check-In   Location  [facility]-Cardiac & Pulmonary Rehab    Staff Present  Justin Mend RCP,RRT,BSRT;Amanda Oletta Darter, [place], ACSM CEP, Exercise Physiologist;Meredith Sherryll Burger, RN BSN    Supervising physician immediately available to respond to emergencies  See telemetry face sheet for immediately available ER MD    Medication changes reported      No    Fall or balance concerns reported     No    Warm-up and Cool-down  Performed on first and last piece of equipment    Resistance Training Performed  Yes    VAD Patient?  No      Pain Assessment   Currently in Pain?  No/denies          Social History   Tobacco Use  Smoking Status Former Smoker  . Last attempt to quit: 2014  . Years since quitting: 5.0  Smokeless Tobacco Never Used  Tobacco Comment   07/30/2017 Quit in 2014    Goals Met:  Independence with exercise equipment Exercise tolerated well No report of cardiac concerns or symptoms Strength training completed today  Goals Unmet:  Not Applicable  Comments: Pt able to follow exercise prescription today without complaint.  Will continue to monitor for progression.   Dr. Emily Filbert is Medical Director for [place] and [facility].

## 2017-08-28 LAB — SURGICAL PATHOLOGY

## 2017-08-31 ENCOUNTER — Encounter: Payer: Medicare Other | Admitting: *Deleted

## 2017-08-31 DIAGNOSIS — I5022 Chronic systolic (congestive) heart failure: Secondary | ICD-10-CM

## 2017-08-31 NOTE — Progress Notes (Signed)
Daily Session Note  Patient Details  Name: Carl [name] MRN: [id number] Date of Birth: [birth year] Referring Provider:     Cardiac Rehab from 07/30/2017 in [facility] Cardiac and Pulmonary Rehab  Referring Provider  [name]      Encounter Date: 08/31/2017  Check In: Session Check In - 08/31/17 1708      Check-In   Location  [facility]-Cardiac & Pulmonary Rehab    Staff Present  Earlean Shawl, BS, ACSM CEP, Exercise Physiologist;Carroll Enterkin, RN, Vickki Hearing, BA, ACSM CEP, Exercise Physiologist    Supervising physician immediately available to respond to emergencies  See telemetry face sheet for immediately available ER MD    Medication changes reported      No    Fall or balance concerns reported     No    Warm-up and Cool-down  Performed on first and last piece of equipment    Resistance Training Performed  Yes    VAD Patient?  No      Pain Assessment   Currently in Pain?  Yes has appointment with pain doctor to address this issue    Pain Location  Leg    Pain Orientation  Right    Pain Type  Chronic pain    Multiple Pain Sites  No          Social History   Tobacco Use  Smoking Status Former Smoker  . Last attempt to quit: 2014  . Years since quitting: 5.0  Smokeless Tobacco Never Used  Tobacco Comment   07/30/2017 Quit in 2014    Goals Met:  Exercise tolerated well No report of cardiac concerns or symptoms Strength training completed today  Goals Unmet:  Not Applicable  Comments: Pt able to follow exercise prescription today without complaint.  Will continue to monitor for progression.    Dr. Emily Filbert is Medical Director for [place] and [facility].

## 2017-09-01 ENCOUNTER — Encounter (INDEPENDENT_AMBULATORY_CARE_PROVIDER_SITE_OTHER): Payer: Self-pay | Admitting: Vascular Surgery

## 2017-09-01 ENCOUNTER — Ambulatory Visit (INDEPENDENT_AMBULATORY_CARE_PROVIDER_SITE_OTHER): Payer: Medicare Other | Admitting: Vascular Surgery

## 2017-09-01 VITALS — BP 96/59 | HR 64 | Resp 15 | Ht 72.0 in | Wt 185.0 lb

## 2017-09-01 DIAGNOSIS — I70219 Atherosclerosis of native arteries of extremities with intermittent claudication, unspecified extremity: Secondary | ICD-10-CM | POA: Diagnosis not present

## 2017-09-01 DIAGNOSIS — E785 Hyperlipidemia, unspecified: Secondary | ICD-10-CM | POA: Diagnosis not present

## 2017-09-01 DIAGNOSIS — I10 Essential (primary) hypertension: Secondary | ICD-10-CM | POA: Diagnosis not present

## 2017-09-01 DIAGNOSIS — Z720 Tobacco use: Secondary | ICD-10-CM | POA: Diagnosis not present

## 2017-09-01 DIAGNOSIS — I739 Peripheral vascular disease, unspecified: Secondary | ICD-10-CM | POA: Insufficient documentation

## 2017-09-01 NOTE — Progress Notes (Signed)
Subjective:    Patient ID: Carl [name], male    DOB: 1951/05/15, 67 y.o.   MRN: [id number] Chief Complaint  Patient presents with  . New Patient (Initial Visit)    Cslf pain while walking   Presents as a new patient referred by nurse practitioner Jerelene Redden for evaluation of "claudication".  Patient seen with aid.  Patient endorses "years" of experiencing calf pain with ambulation.  Notes his bilateral calf pain radiates down towards the ankle.  The patient's pain occurs with activity such as walking or while he is on the treadmill.  He notes that over the last 6 months this is worsened.  The patient denies any rest pain or ulceration to the bilateral lower extremity.  When the patient stops the activity causing his discomfort his pain resolves.  The patient does have some back pain with activity, however this does not sound like it has been worked up.  The patient denies any fever, nausea or vomiting.   Review of Systems  Constitutional: Negative.   HENT: Negative.   Eyes: Negative.   Respiratory: Negative.   Cardiovascular:       Bilateral claudication  Gastrointestinal: Negative.   Endocrine: Negative.   Genitourinary: Negative.   Musculoskeletal: Negative.   Skin: Negative.   Allergic/Immunologic: Negative.   Neurological: Negative.   Hematological: Negative.   Psychiatric/Behavioral: Negative.       Objective:   Physical Exam  Constitutional: He is oriented to person, place, and time. He appears well-developed and well-nourished. No distress.  HENT:  Head: Normocephalic and atraumatic.  Eyes: Conjunctivae are normal. Pupils are equal, round, and reactive to light.  Neck: Normal range of motion.  Cardiovascular: Normal rate, regular rhythm, normal heart sounds and intact distal pulses.  Pulses:      Radial pulses are 2+ on the right side, and 2+ on the left side.       Dorsalis pedis pulses are 2+ on the right side, and 2+ on the left side.       Posterior tibial pulses  are 2+ on the right side, and 2+ on the left side.  Pulmonary/Chest: Effort normal and breath sounds normal.  Musculoskeletal: Normal range of motion. He exhibits edema (Mild bilateral lower extremity edema noted.  Nonpitting).  Neurological: He is alert and oriented to person, place, and time.  Skin: He is not diaphoretic.  Psychiatric: He has a normal mood and affect. His behavior is normal. Judgment and thought content normal.  Vitals reviewed.  BP (!) 96/59 (BP Location: Right Arm, Patient Position: Sitting)   Pulse 64   Resp 15   Ht 6' (1.829 m)   Wt 185 lb (83.9 kg)   BMI 25.09 kg/m   Past Medical History:  Diagnosis Date  . Anemia   . Anxiety   . Arthritis   . Brunner's gland hyperplasia of duodenum   . CHF (congestive heart failure) ([place])   . Coronary artery disease   . External hemorrhoids   . GERD (gastroesophageal reflux disease)   . Hearing loss   . HH (hiatus hernia)   . Internal hemorrhoids   . Ischemic cardiomyopathy   . Leg fracture, left   . Myocardial infarction ([place])   . Paronychia   . Psoriasis   . PUD (peptic ulcer disease)   . Schatzki's ring   . Tubular adenoma of colon   . Vitiligo    Social History   Socioeconomic History  . Marital status: Single  Spouse name: Not on file  . Number of children: Not on file  . Years of education: Not on file  . Highest education level: Not on file  Social Needs  . Financial resource strain: Not on file  . Food insecurity - worry: Not on file  . Food insecurity - inability: Not on file  . Transportation needs - medical: Not on file  . Transportation needs - non-medical: Not on file  Occupational History  . Not on file  Tobacco Use  . Smoking status: Former Smoker    Last attempt to quit: 2014    Years since quitting: 5.0  . Smokeless tobacco: Never Used  . Tobacco comment: 07/30/2017 Quit in 2014  Substance and Sexual Activity  . Alcohol use: No    Alcohol/week: 0.0 oz  . Drug use: No  . Sexual  activity: No  Other Topics Concern  . Not on file  Social History Narrative   Pt lives in [place] with his mother.   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 CARDIAC SURGERY    . COLONOSCOPY WITH ESOPHAGOGASTRODUODENOSCOPY (EGD)    . COLONOSCOPY WITH PROPOFOL N/A 08/24/2017   Procedure: COLONOSCOPY WITH PROPOFOL;  Surgeon: [name], MD;  Location: [facility] ENDOSCOPY;  Service: Endoscopy;  Laterality: N/A;  . CORONARY ARTERY BYPASS GRAFT N/A 08/03/2014   Procedure: CORONARY ARTERY BYPASS GRAFTING (CABG);  Surgeon: [name], MD;  Location: [place];  Service: Open Heart Surgery;  Laterality: N/A;  . EP IMPLANTABLE DEVICE N/A 05/01/2015   MDT ICD implanted for primary prevention of sudden death  . ESOPHAGOGASTRODUODENOSCOPY (EGD) WITH PROPOFOL N/A 04/16/2015   Procedure: ESOPHAGOGASTRODUODENOSCOPY (EGD) WITH PROPOFOL;  Surgeon: [name], MD;  Location: [facility] ENDOSCOPY;  Service: Endoscopy;  Laterality: N/A;  . ESOPHAGOGASTRODUODENOSCOPY (EGD) WITH PROPOFOL N/A 08/24/2017   Procedure: ESOPHAGOGASTRODUODENOSCOPY (EGD) WITH PROPOFOL;  Surgeon: [name], MD;  Location: [facility] ENDOSCOPY;  Service: Endoscopy;  Laterality: N/A;  . FRACTURE SURGERY    . HERNIA REPAIR    . TEE WITHOUT CARDIOVERSION N/A 08/03/2014   Procedure: TRANSESOPHAGEAL ECHOCARDIOGRAM (TEE);  Surgeon: [name], MD;  Location: [place];  Service: Open Heart Surgery;  Laterality: N/A;   Family History  Problem Relation Age of Onset  . Valvular heart disease Mother        Ruptured valve  . CAD Father   . Heart Problems Brother        Stents x 4  . Diabetes Brother   . Prostate cancer Neg Hx   . Bladder Cancer Neg Hx   . Kidney cancer Neg Hx    Allergies  Allergen Reactions  . Spironolactone     gynecomastia        Assessment & Plan:  Presents as a new patient referred by nurse practitioner Jerelene Redden for evaluation of "claudication".   Patient seen with aid.  Patient endorses "years" of experiencing calf pain with ambulation.  Notes his bilateral calf pain radiates down towards the ankle.  The patient's pain occurs with activity such as walking or while he is on the treadmill.  He notes that over the last 6 months this is worsened.  The patient denies any rest pain or ulceration to the bilateral lower extremity.  When the patient stops the activity causing his discomfort his pain resolves.  The patient does have some back pain with activity, however this does not sound like it has been worked  up.  The patient denies any fever, nausea or vomiting.  1. Tobacco use - Stable We had a discussion for approximately 5 minutes regarding the absolute need for smoking cessation due to the deleterious nature of tobacco on the vascular system. We discussed the tobacco use would diminish patency of any intervention, and likely significantly worsen progressio of disease. We discussed multiple agents for quitting including replacement therapy or medications to reduce cravings such as Chantix. The patient voices their understanding of the importance of smoking cessation.  2. Essential hypertension - Stable Encouraged good control as its slows the progression of atherosclerotic disease  3. Hyperlipidemia, unspecified hyperlipidemia type - Stable Encouraged good control as its slows the progression of atherosclerotic disease  4. Atherosclerotic peripheral vascular disease with intermittent claudication ([place]) - New Patient with multiple risk factors for peripheral artery disease He does have 2+ peripheral pulses bilaterally. I will order an ABI just to assess if there is any peripheral artery disease that may be contributing however I do feel that this may be neuropathic in nature I have discussed with the patient at length the risk factors for and pathogenesis of atherosclerotic disease and encouraged a healthy diet, regular exercise regimen and blood  pressure / glucose control.  The patient was encouraged to call the office in the interim if he experiences any claudication like symptoms, rest pain or ulcers to his feet / toes.  - VAS [place] ABI WITH/WO TBI; Future  Current Outpatient Medications on File Prior to Visit  Medication Sig Dispense Refill  . acetaminophen (TYLENOL) 650 MG CR tablet Take by mouth.    Marland Kitchen aluminum-magnesium hydroxide-simethicone (MAALOX) 878-676-72 MG/5ML SUSP Take 30 mLs by mouth 4 (four) times daily -  before meals and at bedtime. 355 mL 0  . aspirin 81 MG tablet Take 81 mg by mouth 2 (two) times daily.    Marland Kitchen atorvastatin (LIPITOR) 40 MG tablet Take 1 tablet (40 mg total) by mouth daily at 6 PM. 30 tablet 1  . carvedilol (COREG) 3.125 MG tablet Take 3.125 mg by mouth 2 (two) times daily with a meal.    . citalopram (CELEXA) 20 MG tablet Take 1 tablet by mouth daily.  5  . cyanocobalamin 500 MCG tablet Take 500 mcg by mouth daily.    Marland Kitchen eplerenone (INSPRA) 25 MG tablet Take 1 tablet (25 mg total) by mouth daily. 30 tablet 6  . ferrous sulfate 325 (65 FE) MG tablet Take 325 mg by mouth daily with breakfast.    . furosemide (LASIX) 40 MG tablet Taking 2 tablets (80 mg) every AM and 40 mg in the PM 30 tablet 3  . losartan (COZAAR) 25 MG tablet Take 0.5 tablets (12.5 mg total) daily by mouth.    . meloxicam (MOBIC) 7.5 MG tablet Take 7.5 mg daily by mouth. Brother reported patient is no longer taking.    Marland Kitchen omeprazole (PRILOSEC) 20 MG capsule Take 20 mg by mouth 2 (two) times daily.    . potassium chloride SA (KLOR-CON M20) 20 MEQ tablet Take 1 tablet (20 mEq total) 2 (two) times daily by mouth. 60 tablet 3  . spironolactone (ALDACTONE) 25 MG tablet Take by mouth.     No current facility-administered medications on file prior to visit.    There are no Patient Instructions on file for this visit. No Follow-up on file.  Jaleyah Longhi A Keylee Shrestha, PA-C

## 2017-09-02 ENCOUNTER — Encounter: Payer: Medicare Other | Admitting: *Deleted

## 2017-09-02 DIAGNOSIS — I5022 Chronic systolic (congestive) heart failure: Secondary | ICD-10-CM

## 2017-09-02 NOTE — Progress Notes (Signed)
Daily Session Note  Patient Details  Name: Carl [name] MRN: [id number] Date of Birth: 09/14/1950 Referring Provider:     Cardiac Rehab from 07/30/2017 in [facility] Cardiac and Pulmonary Rehab  Referring Provider  [name]      Encounter Date: 09/02/2017  Check In: Session Check In - 09/02/17 1704      Check-In   Location  [facility]-Cardiac & Pulmonary Rehab    Staff Present  Renita Papa, RN Vickki Hearing, BA, ACSM CEP, Exercise Physiologist;Carroll Enterkin, RN, BSN    Supervising physician immediately available to respond to emergencies  See telemetry face sheet for immediately available ER MD    Medication changes reported      No    Fall or balance concerns reported     No    Warm-up and Cool-down  Performed on first and last piece of equipment    Resistance Training Performed  Yes    VAD Patient?  No      Pain Assessment   Currently in Pain?  No/denies        Exercise Prescription Changes - 09/02/17 1100      Response to Exercise   Blood Pressure (Admit)  90/42    Blood Pressure (Exercise)  112/60    Blood Pressure (Exit)  128/70    Heart Rate (Admit)  82 bpm    Heart Rate (Exercise)  110 bpm    Heart Rate (Exit)  78 bpm    Rating of Perceived Exertion (Exercise)  15    Symptoms  leg pain - see note    Duration  Progress to 45 minutes of aerobic exercise without signs/symptoms of physical distress    Intensity  THRR unchanged      Progression   Progression  Continue to progress workloads to maintain intensity without signs/symptoms of physical distress.    Average METs  2.2      Resistance Training   Training Prescription  Yes    Weight  3 lb    Reps  10-15      Interval Training   Interval Training  No      NuStep   Level  3    SPM  80    Minutes  15    METs  2.2       Social History   Tobacco Use  Smoking Status Former Smoker  . Last attempt to quit: 2014  . Years since quitting: 5.0  Smokeless Tobacco Never Used  Tobacco Comment   07/30/2017  Quit in 2014    Goals Met:  Independence with exercise equipment Exercise tolerated well No report of cardiac concerns or symptoms Strength training completed today  Goals Unmet:  Not Applicable  Comments: Pt able to follow exercise prescription today without complaint.  Will continue to monitor for progression.    Dr. Emily Filbert is Medical Director for [place] and [facility].

## 2017-09-03 ENCOUNTER — Ambulatory Visit (INDEPENDENT_AMBULATORY_CARE_PROVIDER_SITE_OTHER): Payer: Medicare Other

## 2017-09-03 DIAGNOSIS — I5022 Chronic systolic (congestive) heart failure: Secondary | ICD-10-CM | POA: Diagnosis not present

## 2017-09-03 DIAGNOSIS — Z95810 Presence of automatic (implantable) cardiac defibrillator: Secondary | ICD-10-CM | POA: Diagnosis not present

## 2017-09-03 NOTE — Progress Notes (Signed)
Daily Session Note  Patient Details  Name: Carl [name] MRN: [id number] Date of Birth: [birth year] Referring Provider:     Cardiac Rehab from 07/30/2017 in [facility] Cardiac and Pulmonary Rehab  Referring Provider  [name]      Encounter Date: 09/03/2017  Check In: Session Check In - 09/03/17 1646      Check-In   Location  [facility]-Cardiac & Pulmonary Rehab    Staff Present  Renita Papa, RN Moises Blood, BS, ACSM CEP, Exercise Physiologist;Joseph Flavia Shipper    Supervising physician immediately available to respond to emergencies  See telemetry face sheet for immediately available ER MD    Medication changes reported      No    Fall or balance concerns reported     No    Warm-up and Cool-down  Performed on first and last piece of equipment    Resistance Training Performed  Yes    VAD Patient?  No      Pain Assessment   Currently in Pain?  No/denies    Multiple Pain Sites  No        Exercise Prescription Changes - 09/03/17 1600      Home Exercise Plan   Plans to continue exercise at  Home (comment)    Frequency  Add 1 additional day to program exercise sessions.    Initial Home Exercises Provided  09/03/17       Social History   Tobacco Use  Smoking Status Former Smoker  . Last attempt to quit: 2014  . Years since quitting: 5.0  Smokeless Tobacco Never Used  Tobacco Comment   07/30/2017 Quit in 2014    Goals Met:  Independence with exercise equipment Exercise tolerated well No report of cardiac concerns or symptoms Strength training completed today  Goals Unmet:  Not Applicable  Comments: Reviewed home exercise with pt today.  Pt plans to walk for exercise.  Reviewed THR, pulse, RPE, sign and symptoms, NTG use, and when to call 911 or MD.  Also discussed weather considerations and indoor options.  Pt voiced understanding.    Dr. Emily Filbert is Medical Director for [place] and [facility].

## 2017-09-04 ENCOUNTER — Other Ambulatory Visit (HOSPITAL_COMMUNITY): Payer: Self-pay | Admitting: *Deleted

## 2017-09-04 MED ORDER — FUROSEMIDE 40 MG PO TABS: ORAL_TABLET | ORAL | 3 refills | Status: DC

## 2017-09-04 NOTE — Progress Notes (Signed)
EPIC Encounter for ICM Monitoring  Patient Name: Carl [name] is a 67 y.o. male Date: 09/04/2017 Primary Care Physican: [name], MD Primary Cardiologist:[place] Electrophysiologist: Allred DryWeight:unknown       Spoke with brother.  Heart Failure questions reviewed, pt asymptomatic.   Thoracic impedance abnormal suggesting fluid accumulation.  Prescribed dosage: Furosemide 40 mg 2 tablets (80 mg total) in AM and 1 tab (40 mg) in PM. Potassium 20 mEq 1 tablet twice a day.  Recommendations: No changes.   Encouraged to call for fluid symptoms.  Follow-up plan: ICM clinic phone appointment on 09/17/2017 to recheck fluid levels.  Office appointment scheduled 09/21/2017 with HF NP/PA.  Copy of ICM check sent to Dr. Rayann Heman and Dr [name] for review and if recommendations will call back.  3 month ICM trend: 09/03/2017    1 Year ICM trend:       Rosalene Billings, RN 09/04/2017 4:00 PM

## 2017-09-06 NOTE — Progress Notes (Signed)
Increase Lasix to 80 mg bid x 2 days then back to 80 qam/40 qpm.

## 2017-09-07 ENCOUNTER — Telehealth (HOSPITAL_COMMUNITY): Payer: Self-pay | Admitting: *Deleted

## 2017-09-07 DIAGNOSIS — I5022 Chronic systolic (congestive) heart failure: Secondary | ICD-10-CM | POA: Diagnosis not present

## 2017-09-07 NOTE — Progress Notes (Signed)
Call to brother to provide Dr Claris Gladden instructions.  He said he spoke with HF clinic this morning because patient's weight weight gain.  He said weight this AM was 180 lbs which is 9 lbs heavier than a week ago (was 171 lbs).  Patient has some mild swelling of feet.  Dr Claris Gladden office scheduled office appointment with HF clinic scheduled for 09/09/2016 and until then patient should take Furosemide 80 mg bid.  Will recheck remote transmission 09/17/2017.

## 2017-09-07 NOTE — Progress Notes (Signed)
Daily Session Note  Patient Details  Name: Carl [name] MRN: [id number] Date of Birth: Feb 03, 1951 Referring Provider:     Cardiac Rehab from 07/30/2017 in [facility] Cardiac and Pulmonary Rehab  Referring Provider  [name]      Encounter Date: 09/07/2017  Check In: Session Check In - 09/07/17 1713      Check-In   Location  [facility]-Cardiac & Pulmonary Rehab    Staff Present  Earlean Shawl, BS, ACSM CEP, Exercise Physiologist;Amanda Oletta Darter, BA, ACSM CEP, Exercise Physiologist;Carroll Enterkin, RN, BSN    Supervising physician immediately available to respond to emergencies  See telemetry face sheet for immediately available ER MD    Medication changes reported      No    Fall or balance concerns reported     No    Warm-up and Cool-down  Performed on first and last piece of equipment    Resistance Training Performed  Yes    VAD Patient?  No      Pain Assessment   Currently in Pain?  No/denies    Multiple Pain Sites  No          Social History   Tobacco Use  Smoking Status Former Smoker  . Last attempt to quit: 2014  . Years since quitting: 5.0  Smokeless Tobacco Never Used  Tobacco Comment   07/30/2017 Quit in 2014    Goals Met:  Independence with exercise equipment Exercise tolerated well No report of cardiac concerns or symptoms Strength training completed today  Goals Unmet:  Not Applicable  Comments: Pt able to follow exercise prescription today without complaint.  Will continue to monitor for progression.    Dr. Emily Filbert is Medical Director for [place].

## 2017-09-07 NOTE — Telephone Encounter (Signed)
Pts brother called to report 9lb weight gain in 5 days. Mild swelling in feet and ankles. Normal urine output. Per Dr.McLean patient needs to be seen in APP clinic and increase lasix to 80 bid until office visit. Appt scheduled for 09/09/17 at 12pm and he is aware of med changes.

## 2017-09-09 ENCOUNTER — Encounter (HOSPITAL_COMMUNITY): Payer: Self-pay

## 2017-09-09 ENCOUNTER — Ambulatory Visit (HOSPITAL_COMMUNITY)
Admission: RE | Admit: 2017-09-09 | Discharge: 2017-09-09 | Disposition: A | Discharge status: Home / Self Care | Payer: Medicare Other | Source: Ambulatory Visit | Attending: Internal Medicine | Admitting: Internal Medicine

## 2017-09-09 VITALS — BP 116/61 | HR 86 | Wt 189.0 lb

## 2017-09-09 DIAGNOSIS — Z87891 Personal history of nicotine dependence: Secondary | ICD-10-CM | POA: Diagnosis not present

## 2017-09-09 DIAGNOSIS — F70 Mild intellectual disabilities: Secondary | ICD-10-CM | POA: Insufficient documentation

## 2017-09-09 DIAGNOSIS — Z9889 Other specified postprocedural states: Secondary | ICD-10-CM | POA: Insufficient documentation

## 2017-09-09 DIAGNOSIS — I340 Nonrheumatic mitral (valve) insufficiency: Secondary | ICD-10-CM | POA: Insufficient documentation

## 2017-09-09 DIAGNOSIS — Z79899 Other long term (current) drug therapy: Secondary | ICD-10-CM | POA: Insufficient documentation

## 2017-09-09 DIAGNOSIS — Z7982 Long term (current) use of aspirin: Secondary | ICD-10-CM | POA: Insufficient documentation

## 2017-09-09 DIAGNOSIS — I5022 Chronic systolic (congestive) heart failure: Secondary | ICD-10-CM | POA: Diagnosis not present

## 2017-09-09 DIAGNOSIS — I519 Heart disease, unspecified: Secondary | ICD-10-CM

## 2017-09-09 DIAGNOSIS — I255 Ischemic cardiomyopathy: Secondary | ICD-10-CM

## 2017-09-09 DIAGNOSIS — N182 Chronic kidney disease, stage 2 (mild): Secondary | ICD-10-CM

## 2017-09-09 DIAGNOSIS — Z951 Presence of aortocoronary bypass graft: Secondary | ICD-10-CM | POA: Diagnosis not present

## 2017-09-09 DIAGNOSIS — N62 Hypertrophy of breast: Secondary | ICD-10-CM | POA: Insufficient documentation

## 2017-09-09 DIAGNOSIS — N183 Chronic kidney disease, stage 3 (moderate): Secondary | ICD-10-CM | POA: Diagnosis not present

## 2017-09-09 DIAGNOSIS — I2510 Atherosclerotic heart disease of native coronary artery without angina pectoris: Secondary | ICD-10-CM | POA: Insufficient documentation

## 2017-09-09 DIAGNOSIS — Z8489 Family history of other specified conditions: Secondary | ICD-10-CM | POA: Insufficient documentation

## 2017-09-09 DIAGNOSIS — F329 Major depressive disorder, single episode, unspecified: Secondary | ICD-10-CM | POA: Insufficient documentation

## 2017-09-09 DIAGNOSIS — E785 Hyperlipidemia, unspecified: Secondary | ICD-10-CM | POA: Diagnosis not present

## 2017-09-09 LAB — BASIC METABOLIC PANEL
Anion gap: 9 (ref 5–15)
BUN: 35 mg/dL — AB (ref 6–20)
CHLORIDE: 102 mmol/L (ref 101–111)
CO2: 23 mmol/L (ref 22–32)
Calcium: 8.6 mg/dL — ABNORMAL LOW (ref 8.9–10.3)
Creatinine, Ser: 1.53 mg/dL — ABNORMAL HIGH (ref 0.61–1.24)
GFR calc non Af Amer: 46 mL/min — ABNORMAL LOW (ref >60)
GFR, EST AFRICAN AMERICAN: 53 mL/min — AB (ref >60)
Glucose, Bld: 103 mg/dL — ABNORMAL HIGH (ref 65–99)
POTASSIUM: 4.4 mmol/L (ref 3.5–5.1)
Sodium: 134 mmol/L — ABNORMAL LOW (ref 135–145)

## 2017-09-09 MED ORDER — FUROSEMIDE 80 MG PO TABS: 80.0000 mg | ORAL_TABLET | Freq: Two times a day (BID) | ORAL | 6 refills | Status: DC

## 2017-09-09 NOTE — Progress Notes (Signed)
Patient ID: Carl [name], male   DOB: July 27, 1951, 67 y.o.   MRN: [id number] PCP: Dr. Ginette Pitman Cardiology: Dr. [name]  66 yo with history of smoking and mild mental retardation was admitted to [facility] in 12/15 with dyspnea.  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  LHC showed 3 vessel disease with EF 15%.  Echo showed EF 15-20%.  Patient had CABG x 5.  It was difficult to wean him off pressors post-op.  He ended up having to start midodrine.  This was weaned off.  Admitted 10/17 with volume overload and AKI. Diuresed with IV lasix and transitiioned to 40 mg lasix daily. Spiro, dig, lisinopril stopped due to elevated creatinine 2.28. Discharge weight 163 pounds.    Echo in 2/18 showed EF 30-35%, mild LV dilation, rWMAs, moderate MR, mild to moderately decreased RV systolic function. Cardiolite in 2/18 showed large inferoseptal/inferior/inferolateral infarction with no ischemia.   Admitted to [facility] [place] 06/10/17-06/12/17 with acute on chronic systolic CHF. Diuresed 5 pounds with IV Lasix. Discharge weight was 175 pounds.   Today he returns for HF follow up. Recently lasix has been increased due to abnormal optivol. Overall feeling fine. Denies SOB/PND/Orthopnea. Appetite ok. No fever or chills. Weight at home trending up to to 180 pounds. Eating taking more take out and snacking on chips. Taking all medications. Medications are placed in a pill box by his brother.  Currently not driving. Requires assistance driving.   Labs (12/15): K 4.1, creatinine 0.81, hgb 9.1 Labs (09/12/2014): K 3.7 Creatinine 0.96, digoxin 0.7 Labs (3/16): digoxin 0.8, LDL 62, HDL 40 Labs (4/16): K 4.3, creatinine 1.1 Labs (5/16) K 4.6, creatinine 1.24, digoxin 1.0 Labs (05/2016): K 3.6 Creatinine 1.47  => 1.37 Labs (11/17): LDL 61, HDL 41, K 4.2, creaitnine 1.1 Labs (2/18): K 3.7 => 5.3, creatinine 1.84 => 1.35, BNP 924 Labs (3/18): K 4.8, creatinine 1.33, BNP 552 Labs (9/18): K 3.9, creatinine 1.97 Labs (08/03/2018):  K 4.2 Creatinine 1.6  PMH: 1. Smoker 2. Mild mental retardation 3. CAD: LHC (12/15) with 3 vessel disease.  CABG x 5 in 12/15 with LIMA-LAD, SVG-D1, sequential SVG-OM2/OM3, SVG-PDA.   - Cardiolite (2/18):  Large inferoseptal/inferior/inferolateral infarction with no ischemia, EF 29%.  4. Ischemic cardiomyopathy: Echo (12/15) with EF 15-20%, wall motion abnormalities, mildly decreased RV systolic function, mild MR.  Echo (3/16) with EF 30-35%, severe LV dilation, moderate MR, PA systolic pressure 42 mmHg. Echo (8/16) with EF 25-30%, severely dilated LV, diffuse hypokinesis with inferior akinesis, restrictive diastolic function, RV mildly dilated with mildly decreased systolic function, moderate MR.  - Echo (10/17): EF 20-25%, moderate MR.  - Medtronic ICD.  - ACEI cough.  - Echo (2/18): EF 30-35%, mild LV dilation, regional WMAs, moderate diastolic dysfunction, normal RV size with mild to moderately decreased systolic function, moderate MR (likely infarct-related).  5. Depression 6. Vitiligo 7. Mitral regurgitation: Moderate on 2/18, likely infarct-related.  8. CKD stage III.   SH: Lives with mother, quit smoking in 12/15.  No ETOH.   FH: Brother and father with MIs.    ROS: All systems reviewed and negative except as per HPI.   Current Outpatient Medications  Medication Sig Dispense Refill  . acetaminophen (TYLENOL) 650 MG CR tablet Take by mouth.    Marland Kitchen aluminum-magnesium hydroxide-simethicone (MAALOX) 194-174-08 MG/5ML SUSP Take 30 mLs by mouth 4 (four) times daily -  before meals and at bedtime. 355 mL 0  . aspirin 81 MG tablet Take 81  mg by mouth 2 (two) times daily.    Marland Kitchen atorvastatin (LIPITOR) 40 MG tablet Take 1 tablet (40 mg total) by mouth daily at 6 PM. 30 tablet 1  . carvedilol (COREG) 3.125 MG tablet Take 3.125 mg by mouth 2 (two) times daily with a meal.    . citalopram (CELEXA) 20 MG tablet Take 1 tablet by mouth daily.  5  . cyanocobalamin 500 MCG tablet Take 500 mcg by  mouth daily.    Marland Kitchen eplerenone (INSPRA) 25 MG tablet Take 1 tablet (25 mg total) by mouth daily. 30 tablet 6  . ferrous sulfate 325 (65 FE) MG tablet Take 325 mg by mouth daily with breakfast.    . furosemide (LASIX) 40 MG tablet Taking 2 tablets (80 mg) every AM and 40 mg in the PM 90 tablet 3  . losartan (COZAAR) 25 MG tablet Take 0.5 tablets (12.5 mg total) daily by mouth.    . meloxicam (MOBIC) 7.5 MG tablet Take 7.5 mg daily by mouth. Brother reported patient is no longer taking.    Marland Kitchen omeprazole (PRILOSEC) 20 MG capsule Take 20 mg by mouth 2 (two) times daily.    . potassium chloride SA (KLOR-CON M20) 20 MEQ tablet Take 1 tablet (20 mEq total) 2 (two) times daily by mouth. 60 tablet 3  . spironolactone (ALDACTONE) 25 MG tablet Take by mouth.     No current facility-administered medications for this encounter.    BP 116/61 (BP Location: Right Arm, Patient Position: Sitting, Cuff Size: Normal)   Pulse 86   Wt 189 lb (85.7 kg)   SpO2 97%   BMI 25.63 kg/m   Filed Weights   09/09/17 1216  Weight: 189 lb (85.7 kg)    General:  Well appearing. No resp difficulty Walked in the clinic with his sister.  HEENT: normal Neck: supple. JVP 10-11. Carotids 2+ bilat; no bruits. No lymphadenopathy or thryomegaly appreciated. Cor: PMI nondisplaced. Regular rate & rhythm. No rubs, gallops or murmurs. Lungs: clear Abdomen: soft, nontender, distended. No hepatosplenomegaly. No bruits or masses. Good bowel sounds. Extremities: no cyanosis, clubbing, rash, edema Neuro: alert & orientedx3, cranial nerves grossly intact. moves all 4 extremities w/o difficulty. Affect pleasant   Assessment/Plan: 1. CAD: status post CABG.  Cardiolite in 2/18 showed infarction, no ischemia.   - No S/S ischemia.  - Continue ASA and statin.    2. Chronic systolic CHF: Ischemic CMP.  Echo 2/18 with EF 30-35%, mild to moderate RV dysfunction.  Has Medtronic ICD. Optivol fluid trending up to baseline. Impedance down. Activity  ~3-4 hours per day.  - NYHA II.  -Volume status elevated in the setting of high salt food choices.  -. Increase lasix to 80 mg twice a day. Check BMET today.  - Continue inspra 25 mg daily.  -  Continue current dose of losartan 25 mg daily. Considered changing to entresto but not sure he will tolerate. If volume status remains difficult to manage will reconsider.   - Coreg 3.125 mg BID -  3. Smoking:  - No longer smoking.   4. Depression:  - Continue Celexa  5. Hyperlipidemia:  - Continue statin.       6. Mitral regurgitation: - Likely infarct related MR. - Stable by last Echo.   7. CKD Stage III:  Creatinine baseline 1.3-1.5  Check BMET today  8. Breast mass: - Mammogram confirmed gynecomastia -on inspra. Off spironolactone.   Follow up in 4 weeks to reassess volume status. Check BMET  today Greater than 50% of the (total minutes 25) visit spent in counseling/coordination of care regarding medications changes, optivol results, and low salt food choices.        Amy Clegg NP-C  09/09/2017

## 2017-09-09 NOTE — Patient Instructions (Addendum)
Routine lab work today. Will notify you of abnormal results, otherwise no news is good news!  INCREASE Lasix to 80 mg twice daily every day.  Follow up 4 weeks with Amy Clegg NP-C.  Take all medication as prescribed the day of your appointment. Bring all medications with you to your appointment.  Do the following things EVERYDAY: 1) Weigh yourself in the morning before breakfast. Write it down and keep it in a log. 2) Take your medicines as prescribed 3) Eat low salt foods-Limit salt (sodium) to 2000 mg per day.  4) Stay as active as you can everyday 5) Limit all fluids for the day to less than 2 liters

## 2017-09-10 ENCOUNTER — Other Ambulatory Visit (HOSPITAL_COMMUNITY): Payer: Self-pay | Admitting: *Deleted

## 2017-09-10 MED ORDER — FUROSEMIDE 80 MG PO TABS: 80.0000 mg | ORAL_TABLET | Freq: Two times a day (BID) | ORAL | 6 refills | Status: DC

## 2017-09-14 DIAGNOSIS — I5022 Chronic systolic (congestive) heart failure: Secondary | ICD-10-CM | POA: Diagnosis not present

## 2017-09-14 DIAGNOSIS — K2930 Chronic superficial gastritis without bleeding: Secondary | ICD-10-CM | POA: Insufficient documentation

## 2017-09-14 NOTE — Progress Notes (Signed)
Daily Session Note  Patient Details  Name: TANISH PRIEN MRN: [id number] Date of Birth: 1950/11/17 Referring Provider:     Cardiac Rehab from 07/30/2017 in [facility] Cardiac and Pulmonary Rehab  Referring Provider  [name]      Encounter Date: 09/14/2017  Check In: Session Check In - 09/14/17 1703      Check-In   Location  [facility]-Cardiac & Pulmonary Rehab    Staff Present  [place], BS, [place];Nada Maclachlan, BA, ACSM CEP, Exercise Physiologist;Carroll Enterkin, RN, BSN    Supervising physician immediately available to respond to emergencies  See telemetry face sheet for immediately available ER MD    Medication changes reported      No    Fall or balance concerns reported     No    Warm-up and Cool-down  Performed on first and last piece of equipment    Resistance Training Performed  Yes    VAD Patient?  No      Pain Assessment   Currently in Pain?  No/denies    Multiple Pain Sites  No          Social History   Tobacco Use  Smoking Status Former Smoker  . Last attempt to quit: 2014  . Years since quitting: 5.0  Smokeless Tobacco Never Used  Tobacco Comment   07/30/2017 Quit in 2014    Goals Met:  Independence with exercise equipment Exercise tolerated well Personal goals reviewed No report of cardiac concerns or symptoms Strength training completed today  Goals Unmet:  Not Applicable  Comments: Pt able to follow exercise prescription today without complaint.  Will continue to monitor for progression.    Dr. Emily Filbert is Medical Director for [place] and [facility].

## 2017-09-17 ENCOUNTER — Ambulatory Visit (INDEPENDENT_AMBULATORY_CARE_PROVIDER_SITE_OTHER): Payer: Medicare Other | Admitting: *Deleted

## 2017-09-17 DIAGNOSIS — I5022 Chronic systolic (congestive) heart failure: Secondary | ICD-10-CM

## 2017-09-17 DIAGNOSIS — Z95810 Presence of automatic (implantable) cardiac defibrillator: Secondary | ICD-10-CM

## 2017-09-17 DIAGNOSIS — I255 Ischemic cardiomyopathy: Secondary | ICD-10-CM | POA: Diagnosis not present

## 2017-09-17 NOTE — Progress Notes (Signed)
Remote ICD transmission.   

## 2017-09-18 ENCOUNTER — Ambulatory Visit (INDEPENDENT_AMBULATORY_CARE_PROVIDER_SITE_OTHER): Payer: Medicare Other | Admitting: Vascular Surgery

## 2017-09-18 ENCOUNTER — Encounter: Payer: Self-pay | Admitting: Cardiology

## 2017-09-18 ENCOUNTER — Encounter (INDEPENDENT_AMBULATORY_CARE_PROVIDER_SITE_OTHER): Payer: Medicare Other

## 2017-09-18 NOTE — Progress Notes (Signed)
EPIC Encounter for ICM Monitoring  Patient Name: Carl [name] is a 67 y.o. male Date: 09/18/2017 Primary Care Physican: [name], MD Primary Cardiologist:McLean Electrophysiologist: Allred DryWeight:178.8 lbs         Spoke to brother.  Heart Failure questions reviewed, pt asymptomatic.   Thoracic impedance abnormal suggesting fluid accumulation.  Prescribed dosage:  Furosemide 40 mg 2 tablets (80 mg total) twice a day (increased by HF clinic 09/09/17). Potassium 20 mEq 1 tablet twice a day.  Labs: 09/09/2017 Creatinine 1.53, BUN 35, Potassium 4.4, Sodium 134, EGFR 46-53 08/13/2017 Creatinine 1.43, BUN 35, Potassium 4.2, Sodium 131, EGFR 50-57  06/19/2017 Creatinine 1.60, BUN 17, Potassium 4.8, Sodium 135, EGFR 44-51  06/12/2017 Creatinine 1.43, BUN 23, Potassium 3.4, Sodium 136, EGFR 50-58  06/10/2017 Creatinine 1.38, BUN 21, Potassium 3.8, Sodium 136, EGFR 52->60  05/27/2017 Creatinine 1.47, BUN 15, Potassium 3.4, Sodium 138, EGFR 48-56  05/25/2017 Creatinine 1.36, BUN 15, Potassium 2.8, Sodium 138, EGFR 53->60  04/28/2017 Creatinine 1.97, BUN 31, Potassium 3.9, Sodium 139, EGFR 34-39  A complete set of results can be found in Results Review.  Recommendations:  Patient has office appointment Monday.  Dicussed low salt food choices with brother.   Follow-up plan: ICM clinic phone appointment on 10/01/2017.  Office appointment scheduled 09/21/2017 with HF clinic NP/PA.        Copy of ICM check sent to Dr. Rayann Heman.   3 month ICM trend: 09/18/2017    1 Year ICM trend:       Rosalene Billings, RN 09/18/2017 10:18 AM

## 2017-09-20 NOTE — Progress Notes (Signed)
Patient ID: Carl [name], male   DOB: 04-24-1951, 67 y.o.   MRN: [id number] PCP: Dr. Ginette Pitman Cardiology: Dr. [name]  67 yo with history of smoking and mild mental retardation was admitted to [facility] in 12/15 with dyspnea.  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  LHC showed 3 vessel disease with EF 15%.  Echo showed EF 15-20%.  Patient had CABG x 5.  It was difficult to wean him off pressors post-op.  He ended up having to start midodrine.  This was weaned off.  Admitted 10/17 with volume overload and AKI. Diuresed with IV lasix and transitiioned to 40 mg lasix daily. Spiro, dig, lisinopril stopped due to elevated creatinine 2.28. Discharge weight 163 pounds.    Echo in 2/18 showed EF 30-35%, mild LV dilation, rWMAs, moderate MR, mild to moderately decreased RV systolic function. Cardiolite in 2/18 showed large inferoseptal/inferior/inferolateral infarction with no ischemia.   Admitted to [facility] 06/10/17-06/12/17 with acute on chronic systolic CHF. Diuresed 5 pounds with IV Lasix. Discharge weight was 175 pounds.   Today he returns for HF follow up. Last visit lasix was increased to 80 mg twice a day.  Overall feeling fine. Denies SOB/PND/Orthopnea. Appetite ok. No fever or chills. Weight at home has been stable 178-182 pounds. He is attending cardiac rehab 3 days a week at [facility]. His brother prepares all medications. Lives alone. His sister prepares food for him.   Labs (12/15): K 4.1, creatinine 0.81, hgb 9.1 Labs (09/12/2014): K 3.7 Creatinine 0.96, digoxin 0.7 Labs (3/16): digoxin 0.8, LDL 62, HDL 40 Labs (4/16): K 4.3, creatinine 1.1 Labs (5/16) K 4.6, creatinine 1.24, digoxin 1.0 Labs (05/2016): K 3.6 Creatinine 1.47  => 1.37 Labs (11/17): LDL 61, HDL 41, K 4.2, creaitnine 1.1 Labs (2/18): K 3.7 => 5.3, creatinine 1.84 => 1.35, BNP 924 Labs (3/18): K 4.8, creatinine 1.33, BNP 552 Labs (9/18): K 3.9, creatinine 1.97 Labs (08/03/2018): K 4.2 Creatinine 1.6 Labs ( 09/09/2017): K 4.4  Creatinine 1.53  PMH: 1. Smoker 2. Mild mental retardation 3. CAD: LHC (12/15) with 3 vessel disease.  CABG x 5 in 12/15 with LIMA-LAD, SVG-D1, sequential SVG-OM2/OM3, SVG-PDA.   - Cardiolite (2/18):  Large inferoseptal/inferior/inferolateral infarction with no ischemia, EF 29%.  4. Ischemic cardiomyopathy: Echo (12/15) with EF 15-20%, wall motion abnormalities, mildly decreased RV systolic function, mild MR.  Echo (3/16) with EF 30-35%, severe LV dilation, moderate MR, PA systolic pressure 42 mmHg. Echo (8/16) with EF 25-30%, severely dilated LV, diffuse hypokinesis with inferior akinesis, restrictive diastolic function, RV mildly dilated with mildly decreased systolic function, moderate MR.  - Echo (10/17): EF 20-25%, moderate MR.  - Medtronic ICD.  - ACEI cough.  - Echo (2/18): EF 30-35%, mild LV dilation, regional WMAs, moderate diastolic dysfunction, normal RV size with mild to moderately decreased systolic function, moderate MR (likely infarct-related).  5. Depression 6. Vitiligo 7. Mitral regurgitation: Moderate on 2/18, likely infarct-related.  8. CKD stage III.  9. Melena- 08/24/2018 EGD/Colonoscopy polyp and gastritis.   SH: Lives alone, quit smoking in 12/15.  No ETOH.   FH: Brother and father with MIs.    ROS: All systems reviewed and negative except as per HPI.   Current Outpatient Medications  Medication Sig Dispense Refill  . acetaminophen (TYLENOL) 650 MG CR tablet Take by mouth.    Marland Kitchen aluminum-magnesium hydroxide-simethicone (MAALOX) 852-778-24 MG/5ML SUSP Take 30 mLs by mouth 4 (four) times daily -  before meals and at bedtime.  355 mL 0  . aspirin 81 MG tablet Take 81 mg by mouth 2 (two) times daily.    Marland Kitchen atorvastatin (LIPITOR) 40 MG tablet Take 1 tablet (40 mg total) by mouth daily at 6 PM. 30 tablet 1  . carvedilol (COREG) 3.125 MG tablet Take 3.125 mg by mouth 2 (two) times daily with a meal.    . citalopram (CELEXA) 20 MG tablet Take 1 tablet by mouth daily.  5  .  cyanocobalamin 500 MCG tablet Take 500 mcg by mouth daily.    Marland Kitchen eplerenone (INSPRA) 25 MG tablet Take 1 tablet (25 mg total) by mouth daily. 30 tablet 6  . ferrous sulfate 325 (65 FE) MG tablet Take 325 mg by mouth daily with breakfast.    . furosemide (LASIX) 80 MG tablet Take 1 tablet (80 mg total) by mouth 2 (two) times daily. 60 tablet 6  . losartan (COZAAR) 25 MG tablet Take 0.5 tablets (12.5 mg total) daily by mouth.    . meloxicam (MOBIC) 7.5 MG tablet Take 7.5 mg daily by mouth. Brother reported patient is no longer taking.    Marland Kitchen omeprazole (PRILOSEC) 20 MG capsule Take 20 mg by mouth 2 (two) times daily.    . potassium chloride SA (KLOR-CON M20) 20 MEQ tablet Take 1 tablet (20 mEq total) 2 (two) times daily by mouth. 60 tablet 3   No current facility-administered medications for this encounter.    BP 110/60 (BP Location: Left Arm, Patient Position: Sitting, Cuff Size: Normal)   Pulse 75   Wt 184 lb (83.5 kg)   SpO2 95%   BMI 24.95 kg/m   Filed Weights   09/21/17 0924  Weight: 184 lb (83.5 kg)    General:  Well appearing. No resp difficulty. Walked in the clinic with his brother.  HEENT: normal Neck: supple. no JVD. Carotids 2+ bilat; no bruits. No lymphadenopathy or thryomegaly appreciated. Cor: PMI nondisplaced. Regular rate & rhythm. No rubs, gallops or murmurs. Lungs: clear Abdomen: soft, nontender, nondistended. No hepatosplenomegaly. No bruits or masses. Good bowel sounds. Extremities: no cyanosis, clubbing, rash, edema Neuro: alert & orientedx3, cranial nerves grossly intact. moves all 4 extremities w/o difficulty. Affect pleasant     Assessment/Plan: 1. CAD: status post CABG.  Cardiolite in 2/18 showed infarction, no ischemia.   - No S/S ischemia.  - Continue ASA and statin.   2. Chronic systolic CHF: Ischemic CMP.  Echo 2/18 with EF 30-35%, mild to moderate RV dysfunction.  Has Medtronic ICD.  Optivol fluid - well below threshold.  - NYHA II.  - Continue lasix  80 mg twice a day.  - Continue inspra 25 mg daily.  -  Continue current dose of losartan 25 mg daily. Considered changing to entresto but his brother did not want to add another medication during the day.    - Coreg 3.125 mg BID 3. Smoking:  - No longer smoking.  4. Depression:  - Continue Celexa 5. Hyperlipidemia:  - Continue statin. .      6. Mitral regurgitation: - Likely infarct related MR. - Stable by last Echo.  7. CKD Stage III:  Creatinine baseline 1.3-1.5  Check BMET today.  8. Breast mass: - Mammogram confirmed gynecomastia -on inspra. Off spironolactone.  9. H/O GI bleed Recent EGD/Colonoscopy with polyp noted and gastritis.  Aspirin was cut back to 81 mg daily.   Greater than 50% of the 25 visit spent in counseling/coordination of care regarding medications, low salt food choices, and  medications.    Follow up 4-6 weeks.       Keidrick Murty NP-C  09/21/2017

## 2017-09-21 ENCOUNTER — Encounter (HOSPITAL_COMMUNITY): Payer: Self-pay

## 2017-09-21 ENCOUNTER — Ambulatory Visit (HOSPITAL_COMMUNITY)
Admission: RE | Admit: 2017-09-21 | Discharge: 2017-09-21 | Disposition: A | Discharge status: Home / Self Care | Payer: Medicare Other | Source: Ambulatory Visit | Attending: Cardiology | Admitting: Cardiology

## 2017-09-21 ENCOUNTER — Encounter: Payer: Self-pay | Admitting: Cardiology

## 2017-09-21 VITALS — BP 110/60 | HR 75 | Wt 184.0 lb

## 2017-09-21 DIAGNOSIS — I38 Endocarditis, valve unspecified: Secondary | ICD-10-CM

## 2017-09-21 DIAGNOSIS — I340 Nonrheumatic mitral (valve) insufficiency: Secondary | ICD-10-CM | POA: Diagnosis not present

## 2017-09-21 DIAGNOSIS — Z951 Presence of aortocoronary bypass graft: Secondary | ICD-10-CM | POA: Insufficient documentation

## 2017-09-21 DIAGNOSIS — F70 Mild intellectual disabilities: Secondary | ICD-10-CM | POA: Diagnosis not present

## 2017-09-21 DIAGNOSIS — F329 Major depressive disorder, single episode, unspecified: Secondary | ICD-10-CM | POA: Insufficient documentation

## 2017-09-21 DIAGNOSIS — I5023 Acute on chronic systolic (congestive) heart failure: Secondary | ICD-10-CM

## 2017-09-21 DIAGNOSIS — N182 Chronic kidney disease, stage 2 (mild): Secondary | ICD-10-CM

## 2017-09-21 DIAGNOSIS — N183 Chronic kidney disease, stage 3 (moderate): Secondary | ICD-10-CM | POA: Diagnosis not present

## 2017-09-21 DIAGNOSIS — Z9889 Other specified postprocedural states: Secondary | ICD-10-CM | POA: Diagnosis not present

## 2017-09-21 DIAGNOSIS — Z7982 Long term (current) use of aspirin: Secondary | ICD-10-CM | POA: Diagnosis not present

## 2017-09-21 DIAGNOSIS — E7849 Other hyperlipidemia: Secondary | ICD-10-CM | POA: Diagnosis not present

## 2017-09-21 DIAGNOSIS — E785 Hyperlipidemia, unspecified: Secondary | ICD-10-CM | POA: Insufficient documentation

## 2017-09-21 DIAGNOSIS — Z79899 Other long term (current) drug therapy: Secondary | ICD-10-CM | POA: Insufficient documentation

## 2017-09-21 DIAGNOSIS — I255 Ischemic cardiomyopathy: Secondary | ICD-10-CM | POA: Insufficient documentation

## 2017-09-21 DIAGNOSIS — N62 Hypertrophy of breast: Secondary | ICD-10-CM | POA: Diagnosis not present

## 2017-09-21 DIAGNOSIS — Z95810 Presence of automatic (implantable) cardiac defibrillator: Secondary | ICD-10-CM | POA: Diagnosis not present

## 2017-09-21 DIAGNOSIS — Z87891 Personal history of nicotine dependence: Secondary | ICD-10-CM | POA: Diagnosis not present

## 2017-09-21 DIAGNOSIS — I2510 Atherosclerotic heart disease of native coronary artery without angina pectoris: Secondary | ICD-10-CM | POA: Diagnosis not present

## 2017-09-21 DIAGNOSIS — Z8489 Family history of other specified conditions: Secondary | ICD-10-CM | POA: Diagnosis not present

## 2017-09-21 DIAGNOSIS — I5022 Chronic systolic (congestive) heart failure: Secondary | ICD-10-CM | POA: Diagnosis not present

## 2017-09-21 LAB — BASIC METABOLIC PANEL
ANION GAP: 11 (ref 5–15)
BUN: 28 mg/dL — ABNORMAL HIGH (ref 6–20)
CO2: 25 mmol/L (ref 22–32)
CREATININE: 1.7 mg/dL — AB (ref 0.61–1.24)
Calcium: 9.1 mg/dL (ref 8.9–10.3)
Chloride: 97 mmol/L — ABNORMAL LOW (ref 101–111)
GFR calc Af Amer: 47 mL/min — ABNORMAL LOW (ref >60)
GFR, EST NON AFRICAN AMERICAN: 40 mL/min — AB (ref >60)
Glucose, Bld: 103 mg/dL — ABNORMAL HIGH (ref 65–99)
Potassium: 4.3 mmol/L (ref 3.5–5.1)
SODIUM: 133 mmol/L — AB (ref 135–145)

## 2017-09-21 MED ORDER — ASPIRIN 81 MG PO TABS: 81.0000 mg | ORAL_TABLET | Freq: Every day | ORAL | 11 refills | Status: DC

## 2017-09-21 NOTE — Patient Instructions (Signed)
DECREASE Aspirin to 81 mg ONCE daily.  Routine lab work today. Will notify you of abnormal results, otherwise no news is good news!  Follow up 6 weeks with Amy Clegg NP-C.  Take all medication as prescribed the day of your appointment. Bring all medications with you to your appointment.  Do the following things EVERYDAY: 1) Weigh yourself in the morning before breakfast. Write it down and keep it in a log. 2) Take your medicines as prescribed 3) Eat low salt foods-Limit salt (sodium) to 2000 mg per day.  4) Stay as active as you can everyday 5) Limit all fluids for the day to less than 2 liters

## 2017-09-23 ENCOUNTER — Encounter: Payer: Self-pay | Admitting: *Deleted

## 2017-09-23 DIAGNOSIS — I5022 Chronic systolic (congestive) heart failure: Secondary | ICD-10-CM

## 2017-09-23 NOTE — Progress Notes (Signed)
Cardiac Individual Treatment Plan  Patient Details  Name: Carl [name] MRN: [id number] Date of Birth: [birth year] Referring Provider:     Cardiac Rehab from 07/30/2017 in [facility] Cardiac and Pulmonary Rehab  Referring Provider  [name]      Initial Encounter Date:    Cardiac Rehab from 07/30/2017 in [facility] Cardiac and Pulmonary Rehab  Date  07/30/17  Referring Provider  [name]      Visit Diagnosis: Heart failure, chronic systolic ([place])  Patient's Home Medications on Admission:  Current Outpatient Medications:  .  acetaminophen (TYLENOL) 650 MG CR tablet, Take by mouth., Disp: , Rfl:  .  aluminum-magnesium hydroxide-simethicone (MAALOX) [phone] MG/5ML SUSP, Take 30 mLs by mouth 4 (four) times daily -  before meals and at bedtime., Disp: 355 mL, Rfl: 0 .  aspirin 81 MG tablet, Take 1 tablet (81 mg total) by mouth daily., Disp: 30 tablet, Rfl: 11 .  atorvastatin (LIPITOR) 40 MG tablet, Take 1 tablet (40 mg total) by mouth daily at 6 PM., Disp: 30 tablet, Rfl: 1 .  carvedilol (COREG) 3.125 MG tablet, Take 3.125 mg by mouth 2 (two) times daily with a meal., Disp: , Rfl:  .  citalopram (CELEXA) 20 MG tablet, Take 1 tablet by mouth daily., Disp: , Rfl: 5 .  cyanocobalamin 500 MCG tablet, Take 500 mcg by mouth daily., Disp: , Rfl:  .  eplerenone (INSPRA) 25 MG tablet, Take 1 tablet (25 mg total) by mouth daily., Disp: 30 tablet, Rfl: 6 .  ferrous sulfate 325 (65 FE) MG tablet, Take 325 mg by mouth daily with breakfast., Disp: , Rfl:  .  furosemide (LASIX) 80 MG tablet, Take 1 tablet (80 mg total) by mouth 2 (two) times daily., Disp: 60 tablet, Rfl: 6 .  losartan (COZAAR) 25 MG tablet, Take 0.5 tablets (12.5 mg total) daily by mouth., Disp: , Rfl:  .  meloxicam (MOBIC) 7.5 MG tablet, Take 7.5 mg daily by mouth. Brother reported patient is no longer taking., Disp: , Rfl:  .  omeprazole (PRILOSEC) 20 MG capsule, Take 20 mg by mouth 2 (two) times daily., Disp: , Rfl:  .  potassium chloride SA  (KLOR-CON M20) 20 MEQ tablet, Take 1 tablet (20 mEq total) 2 (two) times daily by mouth., Disp: 60 tablet, Rfl: 3  Past Medical History: Past Medical History:  Diagnosis Date  . Anemia   . Anxiety   . Arthritis   . Brunner's gland hyperplasia of duodenum   . CHF (congestive heart failure) ([place])   . Coronary artery disease   . External hemorrhoids   . GERD (gastroesophageal reflux disease)   . Hearing loss   . HH (hiatus hernia)   . Internal hemorrhoids   . Ischemic cardiomyopathy   . Leg fracture, left   . Myocardial infarction ([place])   . Paronychia   . Psoriasis   . PUD (peptic ulcer disease)   . Schatzki's ring   . Tubular adenoma of colon   . Vitiligo     Tobacco Use: Social History   Tobacco Use  Smoking Status Former Smoker  . Last attempt to quit: 2014  . Years since quitting: 5.0  Smokeless Tobacco Never Used  Tobacco Comment   07/30/2017 Quit in 2014    Labs: Recent Review Flowsheet Data    Labs for ITP Cardiac and Pulmonary Rehab Latest Ref Rng & Units 08/13/2014 08/14/2014 08/15/2014 11/07/2014 06/11/2017   Cholestrol 0 - 200 mg/dL - - - 109 -   LDLCALC  0 - 99 mg/dL - - - 62 -   HDL >39 mg/dL - - - 40 -   Trlycerides <150 mg/dL - - - 34 -   Hemoglobin A1c 4.8 - 5.6 % - - - - 5.3   PHART 7.350 - 7.450 - - - - -   PCO2ART 35.0 - 45.0 mmHg - - - - -   HCO3 20.0 - 24.0 mEq/L - - - - -   TCO2 0 - 100 mmol/L - - - - -   ACIDBASEDEF 0.0 - 2.0 mmol/L - - - - -   O2SAT % 59.3 80.0 64.5 - -       Exercise Target Goals:    Exercise Program Goal: Individual exercise prescription set using results from initial 6 min walk test and THRR while considering  patient's activity barriers and safety.   Exercise Prescription Goal: Initial exercise prescription builds to 30-45 minutes a day of aerobic activity, 2-3 days per week.  Home exercise guidelines will be given to patient during program as part of exercise prescription that the participant will  acknowledge.  Activity Barriers & Risk Stratification: Activity Barriers & Cardiac Risk Stratification - 07/30/17 1323      Activity Barriers & Cardiac Risk Stratification   Activity Barriers  Back Problems;Other (comment)    Comments  muscle aches   arms and back    Cardiac Risk Stratification  High       6 Minute Walk: 6 Minute Walk    Row Name 07/30/17 1434         6 Minute Walk   Distance  1425 feet     Walk Time  6 minutes     # of Rest Breaks  0     MPH  2.7     METS  2.93     RPE  15     Perceived Dyspnea   0     VO2 Peak  10.26     Symptoms  Yes (comment)     Comments  L leg broken twice and hurts     Resting HR  70 bpm     Resting BP  92/60     Max Ex. HR  84 bpm     Max Ex. BP  118/64     2 Minute Post BP  106/64        Oxygen Initial Assessment:   Oxygen Re-Evaluation:   Oxygen Discharge (Final Oxygen Re-Evaluation):   Initial Exercise Prescription: Initial Exercise Prescription - 07/30/17 1400      Date of Initial Exercise RX and Referring Provider   Date  07/30/17    Referring Provider  [name]      Treadmill   MPH  2.3    Grade  0.5    Minutes  15    METs  2.92      Recumbant Bike   Level  2    RPM  60    Watts  20    Minutes  15    METs  2.9      NuStep   Level  3    SPM  80    Minutes  15    METs  2.9      Prescription Details   Frequency (times per week)  3    Duration  Progress to 45 minutes of aerobic exercise without signs/symptoms of physical distress      Intensity   THRR 40-80% of Max Heartrate  98-126    Ratings of Perceived Exertion  11-13    Perceived Dyspnea  0-4      Resistance Training   Training Prescription  Yes    Weight  3 lb    Reps  10-15       Perform Capillary Blood Glucose checks as needed.  Exercise Prescription Changes: Exercise Prescription Changes    Row Name 07/30/17 1250 08/19/17 1100 09/02/17 1100 09/03/17 1600 09/15/17 1000     Response to Exercise   Blood Pressure (Admit)   92/60  104/62  90/42  -  94/62   Blood Pressure (Exercise)  118/64  122/72  112/60  -  114/66   Blood Pressure (Exit)  106/64  118/70  128/70  -  94/55   Heart Rate (Admit)  70 bpm  80 bpm  82 bpm  -  80 bpm   Heart Rate (Exercise)  105 bpm  110 bpm  110 bpm  -  107 bpm   Heart Rate (Exit)  74 bpm  92 bpm  78 bpm  -  80 bpm   Rating of Perceived Exertion (Exercise)  -  13  15  -  15   Symptoms  -  none  leg pain - see note  -  -   Duration  -  Progress to 45 minutes of aerobic exercise without signs/symptoms of physical distress  Progress to 45 minutes of aerobic exercise without signs/symptoms of physical distress  -  Progress to 45 minutes of aerobic exercise without signs/symptoms of physical distress   Intensity  -  THRR unchanged  THRR unchanged  -  THRR unchanged     Progression   Progression  -  Continue to progress workloads to maintain intensity without signs/symptoms of physical distress.  Continue to progress workloads to maintain intensity without signs/symptoms of physical distress.  -  Continue to progress workloads to maintain intensity without signs/symptoms of physical distress.   Average METs  -  2.93  2.2  -  2.64     Resistance Training   Training Prescription  -  Yes  Yes  -  Yes   Weight  -  3 lb  3 lb  -  3 lb   Reps  -  10-15  10-15  -  10-15     Interval Training   Interval Training  -  No  No  -  No     Treadmill   MPH  -  2.3  -  -  1.7   Grade  -  0.5  -  -  0.5   Minutes  -  15  -  -  15   METs  -  3.08  -  -  2.54     Recumbant Bike   Level  -  2  -  -  2   RPM  -  60  -  -  60   Watts  -  28  -  -  20   Minutes  -  15  -  -  15   METs  -  2.77  -  -  2.54     NuStep   Level  -  -  3  -  -   SPM  -  -  80  -  -   Minutes  -  -  15  -  -   METs  -  -  2.2  -  -  Home Exercise Plan   Plans to continue exercise at  -  -  -  Home (comment)  Home (comment)   Frequency  -  -  -  Add 1 additional day to program exercise sessions.  Add 1 additional  day to program exercise sessions.   Initial Home Exercises Provided  -  -  -  09/03/17  09/03/17      Exercise Comments: Exercise Comments    Row Name 08/10/17 1623 09/03/17 1648         Exercise Comments  First full day of exercise!  Patient was oriented to gym and equipment including functions, settings, policies, and procedures.  Patient's individual exercise prescription and treatment plan were reviewed.  All starting workloads were established based on the results of the 6 minute walk test done at initial orientation visit.  The plan for exercise progression was also introduced and progression will be customized based on patient's performance and goals  Reviewed home exercise with pt today.  Pt plans to walk for exercise.  Reviewed THR, pulse, RPE, sign and symptoms, NTG use, and when to call 911 or MD.  Also discussed weather considerations and indoor options.  Pt voiced understanding.         Exercise Goals and Review: Exercise Goals    Row Name 07/30/17 1434             Exercise Goals   Increase Physical Activity  Yes       Intervention  Provide advice, education, support and counseling about physical activity/exercise needs.;Develop an individualized exercise prescription for aerobic and resistive training based on initial evaluation findings, risk stratification, comorbidities and participant's personal goals.       Expected Outcomes  Achievement of increased cardiorespiratory fitness and enhanced flexibility, muscular endurance and strength shown through measurements of functional capacity and personal statement of participant.       Increase Strength and Stamina  Yes       Intervention  Provide advice, education, support and counseling about physical activity/exercise needs.;Develop an individualized exercise prescription for aerobic and resistive training based on initial evaluation findings, risk stratification, comorbidities and participant's personal goals.       Expected  Outcomes  Achievement of increased cardiorespiratory fitness and enhanced flexibility, muscular endurance and strength shown through measurements of functional capacity and personal statement of participant.       Able to understand and use rate of perceived exertion (RPE) scale  Yes       Intervention  Provide education and explanation on how to use RPE scale       Expected Outcomes  Short Term: Able to use RPE daily in rehab to express subjective intensity level;Long Term:  Able to use RPE to guide intensity level when exercising independently       Able to understand and use Dyspnea scale  Yes       Intervention  Provide education and explanation on how to use Dyspnea scale       Expected Outcomes  Short Term: Able to use Dyspnea scale daily in rehab to express subjective sense of shortness of breath during exertion;Long Term: Able to use Dyspnea scale to guide intensity level when exercising independently       Knowledge and understanding of Target Heart Rate Range (THRR)  Yes       Intervention  Provide education and explanation of THRR including how the numbers were predicted and where they are located for reference  Expected Outcomes  Short Term: Able to state/look up THRR;Long Term: Able to use THRR to govern intensity when exercising independently;Short Term: Able to use daily as guideline for intensity in rehab       Able to check pulse independently  Yes       Intervention  Provide education and demonstration on how to check pulse in carotid and radial arteries.;Review the importance of being able to check your own pulse for safety during independent exercise       Expected Outcomes  Short Term: Able to explain why pulse checking is important during independent exercise;Long Term: Able to check pulse independently and accurately       Understanding of Exercise Prescription  Yes          Exercise Goals Re-Evaluation : Exercise Goals Re-Evaluation    Row Name 09/02/17 1127 09/03/17  1648 09/15/17 1052         Exercise Goal Re-Evaluation   Exercise Goals Review  Increase Physical Activity;Increase Strength and Stamina  Increase Physical Activity;Increase Strength and Stamina  Increase Physical Activity;Increase Strength and Stamina;Able to understand and use rate of perceived exertion (RPE) scale     Comments  Timtohy continues to tolerate exercise well overall.  He is seeing his Dr about R leg pain.  Reviewed home exercise with pt today.  Pt plans to walk for exercise.  Reviewed THR, pulse, RPE, sign and symptoms, NTG use, and when to call 911 or MD.  Also discussed weather considerations and indoor options.  Pt voiced understanding.  Saliou continues to tolerate exercise well.  Staff will monitor as he has seen his Dr for PAD symptoms.       Expected Outcomes  Short - Steffen will continue to attend regularly Long - Rondel will complete HT program  Short - PT will walk at home one day per week Long - Pt will maintain exercise on his own  Short - Laterrance will continue to attend Long - Jamaurion will maintain fitness on his own        Discharge Exercise Prescription (Final Exercise Prescription Changes): Exercise Prescription Changes - 09/15/17 1000      Response to Exercise   Blood Pressure (Admit)  94/62    Blood Pressure (Exercise)  114/66    Blood Pressure (Exit)  94/55    Heart Rate (Admit)  80 bpm    Heart Rate (Exercise)  107 bpm    Heart Rate (Exit)  80 bpm    Rating of Perceived Exertion (Exercise)  15    Duration  Progress to 45 minutes of aerobic exercise without signs/symptoms of physical distress    Intensity  THRR unchanged      Progression   Progression  Continue to progress workloads to maintain intensity without signs/symptoms of physical distress.    Average METs  2.64      Resistance Training   Training Prescription  Yes    Weight  3 lb    Reps  10-15      Interval Training   Interval Training  No      Treadmill   MPH  1.7    Grade  0.5    Minutes   15    METs  2.54      Recumbant Bike   Level  2    RPM  60    Watts  20    Minutes  15    METs  2.54      Home Exercise  Plan   Plans to continue exercise at  Home (comment)    Frequency  Add 1 additional day to program exercise sessions.    Initial Home Exercises Provided  09/03/17       Nutrition:  Target Goals: Understanding of nutrition guidelines, daily intake of sodium <1575m, cholesterol <2040m calories 30% from fat and 7% or less from saturated fats, daily to have 5 or more servings of fruits and vegetables.  Biometrics: Pre Biometrics - 07/30/17 1433      Pre Biometrics   Height  5' 11.75" (1.822 m)    Weight  175 lb 8 oz (79.6 kg)    Waist Circumference  37.5 inches    Hip Circumference  42 inches    Waist to Hip Ratio  0.89 %    BMI (Calculated)  23.98    Single Leg Stand  3.04 seconds        Nutrition Therapy Plan and Nutrition Goals: Nutrition Therapy & Goals - 09/14/17 1646      Nutrition Therapy   RD appointment deferred  Yes       Nutrition Assessments:   Nutrition Goals Re-Evaluation:   Nutrition Goals Discharge (Final Nutrition Goals Re-Evaluation):   Psychosocial: Target Goals: Acknowledge presence or absence of significant depression and/or stress, maximize coping skills, provide positive support system. Participant is able to verbalize types and ability to use techniques and skills needed for reducing stress and depression.   Initial Review & Psychosocial Screening: Initial Psych Review & Screening - 07/30/17 1326      Initial Review   Current issues with  None Identified      Family Dynamics   Good Support System?  Yes Sister and Brother help JeJaishawn  Concerns  Recent loss of significant other    Comments  Mother passed away this year.        Barriers   Psychosocial barriers to participate in program  There are no identifiable barriers or psychosocial needs.;The patient should benefit from training in stress management and  relaxation.      Screening Interventions   Interventions  Yes;Encouraged to exercise    Expected Outcomes  Short Term goal: Utilizing psychosocial counselor, staff and physician to assist with identification of specific Stressors or current issues interfering with healing process. Setting desired goal for each stressor or current issue identified.;Long Term Goal: Stressors or current issues are controlled or eliminated.;Short Term goal: Identification and review with participant of any Quality of Life or Depression concerns found by scoring the questionnaire.;Long Term goal: The participant improves quality of Life and PHQ9 Scores as seen by post scores and/or verbalization of changes       Quality of Life Scores:  Quality of Life - 07/30/17 1327      Quality of Life Scores   Health/Function Pre  21 %    Socioeconomic Pre  20.67 %    Psych/Spiritual Pre  21 %    Family Pre  21 %    GLOBAL Pre  20.94 %      Scores of 19 and below usually indicate a poorer quality of life in these areas.  A difference of  2-3 points is a clinically meaningful difference.  A difference of 2-3 points in the total score of the Quality of Life Index has been associated with significant improvement in overall quality of life, self-image, physical symptoms, and general health in studies assessing change in quality of life.  PHQ-9: Recent Review Flowsheet Data  Depression screen [facility] 2/9 07/30/2017   Decreased Interest 0   Down, Depressed, Hopeless 0   PHQ - 2 Score 0   Altered sleeping 0   Tired, decreased energy 0   Change in appetite 0   Feeling bad or failure about yourself  0   Trouble concentrating 0   Moving slowly or fidgety/restless 0   Suicidal thoughts 0   PHQ-9 Score 0   Difficult doing work/chores Not difficult at all     Interpretation of Total Score  Total Score Depression Severity:  1-4 = Minimal depression, 5-9 = Mild depression, 10-14 = Moderate depression, 15-19 = Moderately severe  depression, 20-27 = Severe depression   Psychosocial Evaluation and Intervention: Psychosocial Evaluation - 08/12/17 1708      Psychosocial Evaluation & Interventions   Comments  Mr. Antolin returned to this program after 3 years.  Counselor met with him for an updated evaluation.  He is now 67 years old and reports his mother who was caring for him has passed away several months ago.  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  He states his sister and brother live close by and his aunt and uncle are coming in from [place] to be here with him over the holidays.  Abrar states he sleeps well and has a good appetite.  He denies depression or anxiety but continues to be on medications typically given for treating these symptoms.  Osmond states his current stressors are he has a court appearance on 12/29 due to "DWI" having admitted to taking some pain pills to the officer.  Saheed has goals to get stronger and be able to do more normal things as his goals while in this program.      Expected Outcomes  Tejuan will benefit from consistent exercise to achieve his stated goals.  The support from staff and other patients will be helpful for him during this time of loss over the holidays.  Staff will follow.     Continue Psychosocial Services   Follow up required by staff       Psychosocial Re-Evaluation: Psychosocial Re-Evaluation    [place] Name 09/14/17 1646             Psychosocial Re-Evaluation   Current issues with  None Identified       Comments  No new stress concerns       Interventions  Encouraged to attend Cardiac Rehabilitation for the exercise;Stress management education       Continue Psychosocial Services   Follow up required by staff          Psychosocial Discharge (Final Psychosocial Re-Evaluation): Psychosocial Re-Evaluation - 09/14/17 1646      Psychosocial Re-Evaluation   Current issues with  None Identified    Comments  No new stress concerns     Interventions  Encouraged to attend Cardiac Rehabilitation for the exercise;Stress management education    Continue Psychosocial Services   Follow up required by staff       Vocational Rehabilitation: Provide vocational rehab assistance to qualifying candidates.   Vocational Rehab Evaluation & Intervention: Vocational Rehab - 07/30/17 1330      Initial Vocational Rehab Evaluation & Intervention   Assessment shows need for Vocational Rehabilitation  No       Education: Education Goals: Education classes will be provided on a variety of topics geared toward better understanding of heart health and risk factor modification. Participant will state understanding/return demonstration of topics presented as noted  by education test scores.  Learning Barriers/Preferences: Learning Barriers/Preferences - 07/30/17 1329      Learning Barriers/Preferences   Learning Barriers  Reading;Inability to learn new things;Hearing Slow understanding     Learning Preferences  Audio;Individual Instruction;Verbal Instruction;Group Instruction       Education Topics:  AED/CPR: - Group verbal and written instruction with the use of models to demonstrate the basic use of the AED with the basic ABC's of resuscitation.   General Nutrition Guidelines/Fats and Fiber: -Group instruction provided by verbal, written material, models and posters to present the general guidelines for heart healthy nutrition. Gives an explanation and review of dietary fats and fiber.   Cardiac Rehab from 09/14/2017 in [facility] Cardiac and Pulmonary Rehab  Date  08/10/17  Educator  PI  Instruction Review Code  1- Verbalizes Understanding      Controlling Sodium/Reading Food Labels: -Group verbal and written material supporting the discussion of sodium use in heart healthy nutrition. Review and explanation with models, verbal and written materials for utilization of the food label.   Cardiac Rehab from 09/14/2017 in [facility] Cardiac and  Pulmonary Rehab  Date  08/10/17  Educator  PI  Instruction Review Code  1- Verbalizes Understanding      Exercise Physiology & General Exercise Guidelines: - Group verbal and written instruction with models to review the exercise physiology of the cardiovascular system and associated critical values. Provides general exercise guidelines with specific guidelines to those with heart or lung disease.    Cardiac Rehab from 09/14/2017 in [facility] Cardiac and Pulmonary Rehab  Date  08/19/17  Educator  AS  Instruction Review Code  1- Verbalizes Understanding      Aerobic Exercise & Resistance Training: - Gives group verbal and written instruction on the various components of exercise. Focuses on aerobic and resistive training programs and the benefits of this training and how to safely progress through these programs..   Cardiac Rehab from 09/14/2017 in [facility] Cardiac and Pulmonary Rehab  Date  08/26/17  Educator  AS  Instruction Review Code  1- Verbalizes Understanding      Flexibility, Balance, Mind/Body Relaxation: Provides group verbal/written instruction on the benefits of flexibility and balance training, including mind/body exercise modes such as yoga, pilates and tai chi.  Demonstration and skill practice provided.   Cardiac Rehab from 09/14/2017 in [facility] Cardiac and Pulmonary Rehab  Date  08/31/17  Educator  AS      Stress and Anxiety: - Provides group verbal and written instruction about the health risks of elevated stress and causes of high stress.  Discuss the correlation between heart/lung disease and anxiety and treatment options. Review healthy ways to manage with stress and anxiety.   Cardiac Rehab from 02/19/2015 in [facility] Cardiac and Pulmonary Rehab  Date  01/24/15  Educator  [facility]  Instruction Review Code (retired)  2- meets goals/outcomes      Depression: - Provides group verbal and written instruction on the correlation between heart/lung disease and depressed mood, treatment  options, and the stigmas associated with seeking treatment.   Cardiac Rehab from 02/19/2015 in [facility] Cardiac and Pulmonary Rehab  Date  02/05/15  Educator  SB  Instruction Review Code (retired)  2- meets Designer, fashion/clothing & Physiology of the Heart: - Group verbal and written instruction and models provide basic cardiac anatomy and physiology, with the coronary electrical and arterial systems. Review of Valvular disease and Heart Failure   Cardiac Rehab from 09/14/2017 in [facility] Cardiac  and Pulmonary Rehab  Date  09/14/17  Educator  CE  Instruction Review Code  1- Verbalizes Understanding      Cardiac Procedures: - Group verbal and written instruction to review commonly prescribed medications for heart disease. Reviews the medication, class of the drug, and side effects. Includes the steps to properly store meds and maintain the prescription regimen. (beta blockers and nitrates)   Cardiac Rehab from 02/19/2015 in [facility] Cardiac and Pulmonary Rehab  Date  01/03/15  Educator  [facility]  Instruction Review Code (retired)  2- meets goals/outcomes      Cardiac Medications I: - Group verbal and written instruction to review commonly prescribed medications for heart disease. Reviews the medication, class of the drug, and side effects. Includes the steps to properly store meds and maintain the prescription regimen.   Cardiac Rehab from 09/14/2017 in [name] Cardiac and Pulmonary Rehab  Date  09/07/17  Educator  [facility]  Instruction Review Code  1- Verbalizes Understanding      Cardiac Medications II: -Group verbal and written instruction to review commonly prescribed medications for heart disease. Reviews the medication, class of the drug, and side effects. (all other drug classes)   Cardiac Rehab from 09/14/2017 in [facility] Cardiac and Pulmonary Rehab  Date  09/02/17 [and Risk factors]  Educator  [facility]  Instruction Review Code  1- Verbalizes Understanding       Go Sex-Intimacy & Heart Disease, Get  SMART - Goal Setting: - Group verbal and written instruction through game format to discuss heart disease and the return to sexual intimacy. Provides group verbal and written material to discuss and apply goal setting through the application of the S.M.A.R.T. Method.   Cardiac Rehab from 02/19/2015 in [facility] Cardiac and Pulmonary Rehab  Date  01/03/15  Educator  [facility]  Instruction Review Code (retired)  2- meets goals/outcomes      Other Matters of the Heart: - Provides group verbal, written materials and models to describe Stable Angina and Peripheral Artery. Includes description of the disease process and treatment options available to the cardiac patient.   Cardiac Rehab from 02/19/2015 in [facility] Cardiac and Pulmonary Rehab  Date  02/07/15  Educator  [facility]  Instruction Review Code (retired)  2- meets goals/outcomes      Exercise & Equipment Safety: - Individual verbal instruction and demonstration of equipment use and safety with use of the equipment.   Cardiac Rehab from 09/14/2017 in [facility] Cardiac and Pulmonary Rehab  Date  07/30/17  Educator  [facility]  Instruction Review Code  1- Verbalizes Understanding      Infection Prevention: - Provides verbal and written material to individual with discussion of infection control including proper hand washing and proper equipment cleaning during exercise session.   Cardiac Rehab from 09/14/2017 in [facility] Cardiac and Pulmonary Rehab  Date  07/30/17  Educator  [facility]  Instruction Review Code  1- Verbalizes Understanding      Falls Prevention: - Provides verbal and written material to individual with discussion of falls prevention and safety.   Cardiac Rehab from 09/14/2017 in [facility] Cardiac and Pulmonary Rehab  Date  07/30/17  Educator  [facility]  Instruction Review Code  1- Verbalizes Understanding      Diabetes: - Individual verbal and written instruction to review signs/symptoms of diabetes, desired ranges of glucose level fasting, after meals and with  exercise. Acknowledge that pre and post exercise glucose checks will be done for 3 sessions at entry of program.   Know Your Numbers and  Risk Factors: -Group verbal and written instruction about important numbers in your health.  Discussion of what are risk factors and how they play a role in the disease process.  Review of Cholesterol, Blood Pressure, Diabetes, and BMI and the role they play in your overall health.   Cardiac Rehab from 09/14/2017 in [facility] Cardiac and Pulmonary Rehab  Date  09/02/17 [and Risk factors]  Educator  [facility]  Instruction Review Code  1- Verbalizes Understanding      Sleep Hygiene: -Provides group verbal and written instruction about how sleep can affect your health.  Define sleep hygiene, discuss sleep cycles and impact of sleep habits. Review good sleep hygiene tips.    Other: -Provides group and verbal instruction on various topics (see comments)   Knowledge Questionnaire Score: Knowledge Questionnaire Score - 07/30/17 1330      Knowledge Questionnaire Score   Pre Score  22/28       Core Components/Risk Factors/Patient Goals at Admission: Personal Goals and Risk Factors at Admission - 07/30/17 1326      Core Components/Risk Factors/Patient Goals on Admission   Heart Failure  Yes    Intervention  Provide a combined exercise and nutrition program that is supplemented with education, support and counseling about heart failure. Directed toward relieving symptoms such as shortness of breath, decreased exercise tolerance, and extremity edema.    Expected Outcomes  Improve functional capacity of life;Short term: Attendance in program 2-3 days a week with increased exercise capacity. Reported lower sodium intake. Reported increased fruit and vegetable intake. Reports medication compliance.;Short term: Daily weights obtained and reported for increase. Utilizing diuretic protocols set by physician.;Long term: Adoption of self-care skills and reduction of barriers for  early signs and symptoms recognition and intervention leading to self-care maintenance.    Lipids  Yes    Intervention  Provide education and support for participant on nutrition & aerobic/resistive exercise along with prescribed medications to achieve LDL <24m, HDL >496m    Expected Outcomes  Short Term: Participant states understanding of desired cholesterol values and is compliant with medications prescribed. Participant is following exercise prescription and nutrition guidelines.;Long Term: Cholesterol controlled with medications as prescribed, with individualized exercise RX and with personalized nutrition plan. Value goals: LDL < 7034mHDL > 40 mg.       Core Components/Risk Factors/Patient Goals Review:  Goals and Risk Factor Review    Row Name 09/14/17 1643             Core Components/Risk Factors/Patient Goals Review   Personal Goals Review  Hypertension;Other       Review  JerJerminew his Dr today about blood in his stool.  He had tests done.  He states he is taking meds as prescribed.  JerMennoid his Dr believes his "burning" in stomach is due to drinking too many sodas. (2 L per day) He plans to drink water more frequently       Expected Outcomes  Short - JerXzanderll follow up with Dr and reduce sodas and drink more water  Long - [name] issues will be resolved          Core Components/Risk Factors/Patient Goals at Discharge (Final Review):  Goals and Risk Factor Review - 09/14/17 1643      Core Components/Risk Factors/Patient Goals Review   Personal Goals Review  Hypertension;Other    Review  JerAmerew his Dr today about blood in his stool.  He had tests done.  He states he is taking meds  as prescribed.  Jalan said his Dr believes his "burning" in stomach is due to drinking too many sodas. (2 L per day) He plans to drink water more frequently    Expected Outcomes  Short - Gervis will follow up with Dr and reduce sodas and drink more water  Long - [name] issues will be resolved        ITP Comments: ITP Comments    Row Name 07/30/17 1322 07/30/17 1330 08/26/17 1017 09/23/17 0625     ITP Comments  Medical review completed today   ITP completed and sent for review, changes as needed and signature.   Documentation of diagnosis can be found in [facility] 10/17 and 10/26 2018  Medication  list not reviewed to day. Khing did not bring his notebook  with his current list. He stated he will bring it with him next visit.  30 day review. Continue with ITP unless directed changes per Medical Director review.   30 Day review. Continue with ITP unless directed changes per Medical Director review.        Comments:

## 2017-09-23 NOTE — Progress Notes (Signed)
Daily Session Note  Patient Details  Name: Carl [name] MRN: [id number] Date of Birth: 1951/03/03 Referring Provider:     Cardiac Rehab from 07/30/2017 in [facility] Cardiac and Pulmonary Rehab  Referring Provider  [name]      Encounter Date: 09/23/2017  Check In: Session Check In - 09/23/17 1702      Check-In   Location  [facility]-Cardiac & Pulmonary Rehab    Staff Present  Renita Papa, RN Vickki Hearing, BA, ACSM CEP, Exercise Physiologist;Carroll Enterkin, RN, BSN    Supervising physician immediately available to respond to emergencies  See telemetry face sheet for immediately available ER MD    Medication changes reported      No    Fall or balance concerns reported     No    Warm-up and Cool-down  Performed on first and last piece of equipment    Resistance Training Performed  Yes    VAD Patient?  No      Pain Assessment   Currently in Pain?  No/denies          Social History   Tobacco Use  Smoking Status Former Smoker  . Last attempt to quit: 2014  . Years since quitting: 5.0  Smokeless Tobacco Never Used  Tobacco Comment   07/30/2017 Quit in 2014    Goals Met:  Independence with exercise equipment Exercise tolerated well No report of cardiac concerns or symptoms Strength training completed today  Goals Unmet:  Not Applicable  Comments: Pt able to follow exercise prescription today without complaint.  Will continue to monitor for progression.    Dr. Emily Filbert is Medical Director for [place] and [facility].

## 2017-09-24 ENCOUNTER — Ambulatory Visit (INDEPENDENT_AMBULATORY_CARE_PROVIDER_SITE_OTHER): Payer: Medicare Other | Admitting: Vascular Surgery

## 2017-09-24 ENCOUNTER — Ambulatory Visit (INDEPENDENT_AMBULATORY_CARE_PROVIDER_SITE_OTHER): Payer: Medicare Other

## 2017-09-24 DIAGNOSIS — I5022 Chronic systolic (congestive) heart failure: Secondary | ICD-10-CM | POA: Diagnosis not present

## 2017-09-24 NOTE — Progress Notes (Signed)
Daily Session Note  Patient Details  Name: Carl [name] MRN: [id number] Date of Birth: Apr 01, 1951 Referring Provider:     Cardiac Rehab from 07/30/2017 in [facility] Cardiac and Pulmonary Rehab  Referring Provider  [name]      Encounter Date: 09/24/2017  Check In: Session Check In - 09/24/17 1716      Check-In   Location  [facility]-Cardiac & Pulmonary Rehab    Staff Present  Earlean Shawl, BS, ACSM CEP, Exercise Physiologist;Meredith Sherryll Burger, RN BSN;Chenell Lozon Flavia Shipper    Supervising physician immediately available to respond to emergencies  See telemetry face sheet for immediately available ER MD    Medication changes reported      No    Fall or balance concerns reported     No    Warm-up and Cool-down  Performed on first and last piece of equipment    Resistance Training Performed  Yes    VAD Patient?  No      Pain Assessment   Currently in Pain?  No/denies          Social History   Tobacco Use  Smoking Status Former Smoker  . Last attempt to quit: 2014  . Years since quitting: 5.0  Smokeless Tobacco Never Used  Tobacco Comment   07/30/2017 Quit in 2014    Goals Met:  Independence with exercise equipment Exercise tolerated well No report of cardiac concerns or symptoms Strength training completed today  Goals Unmet:  Not Applicable  Comments: Pt able to follow exercise prescription today without complaint.  Will continue to monitor for progression.   Dr. Emily Filbert is Medical Director for [place] and [facility].

## 2017-09-28 ENCOUNTER — Encounter: Payer: Medicare Other | Attending: Cardiology | Admitting: *Deleted

## 2017-09-28 DIAGNOSIS — I5022 Chronic systolic (congestive) heart failure: Secondary | ICD-10-CM | POA: Diagnosis present

## 2017-09-28 DIAGNOSIS — Z87891 Personal history of nicotine dependence: Secondary | ICD-10-CM | POA: Insufficient documentation

## 2017-09-28 DIAGNOSIS — Z79899 Other long term (current) drug therapy: Secondary | ICD-10-CM | POA: Insufficient documentation

## 2017-09-28 DIAGNOSIS — Z7982 Long term (current) use of aspirin: Secondary | ICD-10-CM | POA: Diagnosis not present

## 2017-09-28 NOTE — Progress Notes (Signed)
Daily Session Note  Patient Details  Name: CRISTOFER YAFFE MRN: [id number] Date of Birth: 1951-07-12 Referring Provider:     Cardiac Rehab from 07/30/2017 in [facility] Cardiac and Pulmonary Rehab  Referring Provider  [name]      Encounter Date: 09/28/2017  Check In: Session Check In - 09/28/17 1628      Check-In   Location  [facility]-Cardiac & Pulmonary Rehab    Staff Present  Earlean Shawl, BS, ACSM CEP, Exercise Physiologist;Amanda Oletta Darter, BA, ACSM CEP, Exercise Physiologist;Carroll Enterkin, RN, BSN    Supervising physician immediately available to respond to emergencies  See telemetry face sheet for immediately available ER MD    Medication changes reported      No    Fall or balance concerns reported     No    Warm-up and Cool-down  Performed on first and last piece of equipment    Resistance Training Performed  Yes    VAD Patient?  No      Pain Assessment   Currently in Pain?  No/denies    Multiple Pain Sites  No          Social History   Tobacco Use  Smoking Status Former Smoker  . Last attempt to quit: 2014  . Years since quitting: 5.0  Smokeless Tobacco Never Used  Tobacco Comment   07/30/2017 Quit in 2014    Goals Met:  Independence with exercise equipment Exercise tolerated well No report of cardiac concerns or symptoms Strength training completed today  Goals Unmet:  Not Applicable  Comments: Pt able to follow exercise prescription today without complaint.  Will continue to monitor for progression.    Dr. Emily Filbert is Medical Director for [place] and [facility].

## 2017-09-30 ENCOUNTER — Encounter: Payer: Medicare Other | Admitting: *Deleted

## 2017-09-30 DIAGNOSIS — I5022 Chronic systolic (congestive) heart failure: Secondary | ICD-10-CM

## 2017-09-30 NOTE — Progress Notes (Signed)
Daily Session Note  Patient Details  Name: Carl [name] MRN: [id number] Date of Birth: [birth year] Referring Provider:     Cardiac Rehab from 07/30/2017 in [facility] Cardiac and Pulmonary Rehab  Referring Provider  [name]      Encounter Date: 09/30/2017  Check In: Session Check In - 09/30/17 1625      Check-In   Location  [facility]-Cardiac & Pulmonary Rehab    Staff Present  Renita Papa, RN Vickki Hearing, BA, ACSM CEP, Exercise Physiologist;Carroll Enterkin, RN, BSN    Supervising physician immediately available to respond to emergencies  See telemetry face sheet for immediately available ER MD    Medication changes reported      No    Fall or balance concerns reported     No    Warm-up and Cool-down  Performed as group-led instruction    Resistance Training Performed  Yes    VAD Patient?  No      Pain Assessment   Currently in Pain?  No/denies        Exercise Prescription Changes - 09/30/17 1100      Response to Exercise   Blood Pressure (Admit)  102/60    Blood Pressure (Exercise)  132/72    Blood Pressure (Exit)  114/62    Heart Rate (Admit)  61 bpm    Heart Rate (Exercise)  109 bpm    Heart Rate (Exit)  92 bpm    Rating of Perceived Exertion (Exercise)  13    Duration  Progress to 45 minutes of aerobic exercise without signs/symptoms of physical distress    Intensity  THRR unchanged      Progression   Progression  Continue to progress workloads to maintain intensity without signs/symptoms of physical distress.    Average METs  2.4      Resistance Training   Training Prescription  Yes    Weight  3 lb    Reps  10-15      Interval Training   Interval Training  No      Recumbant Bike   Level  2    RPM  60    Watts  13    Minutes  15    METs  2.75      NuStep   Level  3    SPM  80    Minutes  15    METs  2      Home Exercise Plan   Plans to continue exercise at  Home (comment)    Frequency  Add 1 additional day to program exercise sessions.    Initial Home Exercises Provided  09/03/17       Social History   Tobacco Use  Smoking Status Former Smoker  . Last attempt to quit: 2014  . Years since quitting: 5.1  Smokeless Tobacco Never Used  Tobacco Comment   07/30/2017 Quit in 2014    Goals Met:  Independence with exercise equipment Exercise tolerated well No report of cardiac concerns or symptoms Strength training completed today  Goals Unmet:  Not Applicable  Comments: Pt able to follow exercise prescription today without complaint.  Will continue to monitor for progression.    Dr. Emily Filbert is Medical Director for [place] and [facility].

## 2017-10-01 ENCOUNTER — Ambulatory Visit (INDEPENDENT_AMBULATORY_CARE_PROVIDER_SITE_OTHER): Payer: Self-pay

## 2017-10-01 ENCOUNTER — Telehealth: Payer: Self-pay

## 2017-10-01 DIAGNOSIS — Z95810 Presence of automatic (implantable) cardiac defibrillator: Secondary | ICD-10-CM

## 2017-10-01 DIAGNOSIS — I5022 Chronic systolic (congestive) heart failure: Secondary | ICD-10-CM

## 2017-10-01 NOTE — Telephone Encounter (Signed)
Remote ICM transmission received.  Attempted call to brother, [name], and unable to leave a message.

## 2017-10-01 NOTE — Progress Notes (Signed)
EPIC Encounter for ICM Monitoring  Patient Name: Carl [name] is a 67 y.o. male Date: 10/01/2017 Primary Care Physican: [name], MD Primary Cardiologist:[place] Electrophysiologist: Allred DryWeight:Previous weight 178.8 lbs                                             Attempted call to brother, [name] and unable to reach or leave a message.  Transmission reviewed.    Thoracic impedance normal.  Prescribed dosage: Furosemide 80 mg 1 tablet (80 mg total) twice a day. Potassium 20 mEq 1 tablet twice a day.  Labs: 09/09/2017 Creatinine 1.53, BUN 35, Potassium 4.4, Sodium 134, EGFR 46-53 08/13/2017 Creatinine 1.43, BUN 35, Potassium 4.2, Sodium 131, EGFR 50-57  06/19/2017 Creatinine 1.60, BUN 17, Potassium 4.8, Sodium 135, EGFR 44-51  06/12/2017 Creatinine 1.43, BUN 23, Potassium 3.4, Sodium 136, EGFR 50-58  06/10/2017 Creatinine 1.38, BUN 21, Potassium 3.8, Sodium 136, EGFR 52->60  05/27/2017 Creatinine 1.47, BUN 15, Potassium 3.4, Sodium 138, EGFR 48-56  05/25/2017 Creatinine 1.36, BUN 15, Potassium 2.8, Sodium 138, EGFR 53->60  04/28/2017 Creatinine 1.97, BUN 31, Potassium 3.9, Sodium 139, EGFR 34-39  A complete set of results can be found in Results Review.  Recommendations: NONE - Unable to reach.  Follow-up plan: ICM clinic phone appointment on 10/26/2017.  Office appointment scheduled 11/02/2017 with [facility] NP/PA.  Copy of ICM check sent to Dr. Rayann Heman.   3 month ICM trend: 10/01/2017    1 Year ICM trend:       Rosalene Billings, RN 10/01/2017 8:22 AM

## 2017-10-02 ENCOUNTER — Other Ambulatory Visit: Payer: Self-pay | Admitting: Cardiology

## 2017-10-05 ENCOUNTER — Telehealth (HOSPITAL_COMMUNITY): Payer: Self-pay | Admitting: *Deleted

## 2017-10-05 ENCOUNTER — Ambulatory Visit: Payer: Medicare Other | Admitting: Podiatry

## 2017-10-05 ENCOUNTER — Encounter: Payer: Medicare Other | Admitting: *Deleted

## 2017-10-05 DIAGNOSIS — I5022 Chronic systolic (congestive) heart failure: Secondary | ICD-10-CM | POA: Diagnosis not present

## 2017-10-05 NOTE — Progress Notes (Signed)
Daily Session Note  Patient Details  Name: Carl [name] MRN: [id number] Date of Birth: [birth year] Referring Provider:     Cardiac Rehab from 07/30/2017 in [facility] Cardiac and Pulmonary Rehab  Referring Provider  [name]      Encounter Date: 10/05/2017  Check In: Session Check In - 10/05/17 1707      Check-In   Location  [facility]-Cardiac & Pulmonary Rehab    Staff Present  Earlean Shawl, BS, ACSM CEP, Exercise Physiologist;Amanda Oletta Darter, BA, ACSM CEP, Exercise Physiologist;Mary Kellie Shropshire, RN, BSN, MA    Supervising physician immediately available to respond to emergencies  See telemetry face sheet for immediately available ER MD    Medication changes reported      No    Fall or balance concerns reported     No    Warm-up and Cool-down  Performed on first and last piece of equipment    Resistance Training Performed  Yes    VAD Patient?  No      Pain Assessment   Currently in Pain?  No/denies    Multiple Pain Sites  No          Social History   Tobacco Use  Smoking Status Former Smoker  . Last attempt to quit: 2014  . Years since quitting: 5.1  Smokeless Tobacco Never Used  Tobacco Comment   07/30/2017 Quit in 2014    Goals Met:  Independence with exercise equipment Exercise tolerated well No report of cardiac concerns or symptoms Strength training completed today  Goals Unmet:  Not Applicable  Comments: Pt able to follow exercise prescription today without complaint.  Will continue to monitor for progression.    Dr. Emily Filbert is Medical Director for [place] and [facility].

## 2017-10-05 NOTE — Telephone Encounter (Signed)
Received report from [employer] stating patient had a 7.6 lb wt gain in 1 day.  Patient stated he had a lot of high sodium foods over the weekend.  Per Dr. [name] patient needs to increase lasix to 120 mg in the AM and 80 mg in the PM for 3 days.  If weight doesn't come down he wants to see him next week.  I spoke with patient's brother who helps him with his medications and he is agreeable with plan.  Will call us back if weight doesn't come down.

## 2017-10-07 ENCOUNTER — Encounter: Payer: Medicare Other | Admitting: *Deleted

## 2017-10-07 ENCOUNTER — Encounter (HOSPITAL_COMMUNITY): Payer: Medicare Other

## 2017-10-07 DIAGNOSIS — I5022 Chronic systolic (congestive) heart failure: Secondary | ICD-10-CM | POA: Diagnosis not present

## 2017-10-07 LAB — CUP PACEART REMOTE DEVICE CHECK
HIGH POWER IMPEDANCE MEASURED VALUE: 64 Ohm
Implantable Lead Implant Date: 20160906
Lead Channel Impedance Value: 323 Ohm
Lead Channel Pacing Threshold Amplitude: 0.625 V
Lead Channel Sensing Intrinsic Amplitude: 5.75 mV
Lead Channel Sensing Intrinsic Amplitude: 5.75 mV
Lead Channel Setting Sensing Sensitivity: 0.3 mV
MDC IDC LEAD LOCATION: 753860
MDC IDC MSMT BATTERY REMAINING LONGEVITY: 122 mo
MDC IDC MSMT BATTERY VOLTAGE: 3.03 V
MDC IDC MSMT LEADCHNL RV IMPEDANCE VALUE: 380 Ohm
MDC IDC MSMT LEADCHNL RV PACING THRESHOLD PULSEWIDTH: 0.4 ms
MDC IDC PG IMPLANT DT: 20160906
MDC IDC SESS DTM: 20190207083522
MDC IDC SET LEADCHNL RV PACING AMPLITUDE: 2.5 V
MDC IDC SET LEADCHNL RV PACING PULSEWIDTH: 0.4 ms
MDC IDC STAT BRADY RV PERCENT PACED: 0.02 %

## 2017-10-07 NOTE — Progress Notes (Signed)
Daily Session Note  Patient Details  Name: CURRY SEEFELDT MRN: [id number] Date of Birth: 1950/10/19 Referring Provider:     Cardiac Rehab from 07/30/2017 in [facility] Cardiac and Pulmonary Rehab  Referring Provider  [name]      Encounter Date: 10/07/2017  Check In: Session Check In - 10/07/17 1646      Check-In   Location  [facility]-Cardiac & Pulmonary Rehab    Staff Present  Renita Papa, RN Vickki Hearing, BA, ACSM CEP, Exercise Physiologist;Carroll Enterkin, RN, BSN    Supervising physician immediately available to respond to emergencies  See telemetry face sheet for immediately available ER MD    Medication changes reported      No    Fall or balance concerns reported     No    Warm-up and Cool-down  Performed on first and last piece of equipment    Resistance Training Performed  Yes    VAD Patient?  No      Pain Assessment   Currently in Pain?  No/denies          Social History   Tobacco Use  Smoking Status Former Smoker  . Last attempt to quit: 2014  . Years since quitting: 5.1  Smokeless Tobacco Never Used  Tobacco Comment   07/30/2017 Quit in 2014    Goals Met:  Independence with exercise equipment Exercise tolerated well Personal goals reviewed No report of cardiac concerns or symptoms Strength training completed today  Goals Unmet:  Not Applicable  Comments: Pt able to follow exercise prescription today without complaint.  Will continue to monitor for progression.    Dr. Emily Filbert is Medical Director for [place] and [facility].

## 2017-10-08 ENCOUNTER — Encounter: Payer: Self-pay | Admitting: Podiatry

## 2017-10-08 ENCOUNTER — Ambulatory Visit (INDEPENDENT_AMBULATORY_CARE_PROVIDER_SITE_OTHER): Payer: Medicare Other | Admitting: Podiatry

## 2017-10-08 DIAGNOSIS — M79674 Pain in right toe(s): Secondary | ICD-10-CM | POA: Diagnosis not present

## 2017-10-08 DIAGNOSIS — I5022 Chronic systolic (congestive) heart failure: Secondary | ICD-10-CM | POA: Diagnosis not present

## 2017-10-08 DIAGNOSIS — M79675 Pain in left toe(s): Secondary | ICD-10-CM

## 2017-10-08 DIAGNOSIS — B351 Tinea unguium: Secondary | ICD-10-CM

## 2017-10-08 NOTE — Progress Notes (Signed)
   Subjective:    Patient ID: Carl [name], male    DOB: 07-May-1951, 67 y.o.   MRN: [id number]  HPIthis patient presents the office for preventative foot care services.  He says the toenails are painful walking and wearing his shoes.  He presents for preventative foot care services.    Review of Systems  All other systems reviewed and are negative.      Objective:   Physical Exam General Appearance  Alert, conversant and in no acute stress.  Vascular  Dorsalis pedis and posterior pulses are palpable  bilaterally.  Capillary return is within normal limits  Bilaterally. Temperature is within normal limits  Bilaterally  Neurologic  Senn-Weinstein monofilament wire test within normal limits  bilaterally. Muscle power  Within normal limits bilaterally.  Nails Thick disfigured discolored nails with subungual debride bilaterally from hallux to fifth toes bilaterally especially his second toenails both feet. No evidence of bacterial infection or drainage bilaterally.  Orthopedic  No limitations of motion of motion feet bilaterally.  No crepitus or effusions noted.  No bony pathology or digital deformities noted. There is an approximate 1-1/2 inch discrepancy between his legs.    Skin  normotropic skin with no porokeratosis noted bilaterally.  No signs of infections or ulcers noted.          Assessment & Plan:  Onychomycosis  B/L  Limb length left feg.  IE  Debridement of nails  X 10.  RTC 3 months.  Patient described symptoms consistant with intermittant claudication.  He says he has an appointment on Tuesday with vascular doctor.   Gardiner Barefoot DPM   Of treatment

## 2017-10-08 NOTE — Progress Notes (Signed)
Daily Session Note  Patient Details  Name: BARUCH LEWERS MRN: [id number] Date of Birth: May 24, 1951 Referring Provider:     Cardiac Rehab from 07/30/2017 in [facility] Cardiac and Pulmonary Rehab  Referring Provider  [name]      Encounter Date: 10/08/2017  Check In: Session Check In - 10/08/17 1649      Check-In   Location  [facility]-Cardiac & Pulmonary Rehab    Staff Present  Renita Papa, RN Moises Blood, BS, ACSM CEP, Exercise Physiologist;Clearnce Leja Flavia Shipper    Supervising physician immediately available to respond to emergencies  See telemetry face sheet for immediately available ER MD    Medication changes reported      No    Fall or balance concerns reported     No    Warm-up and Cool-down  Performed on first and last piece of equipment    Resistance Training Performed  Yes    VAD Patient?  No      Pain Assessment   Currently in Pain?  No/denies          Social History   Tobacco Use  Smoking Status Former Smoker  . Last attempt to quit: 2014  . Years since quitting: 5.1  Smokeless Tobacco Never Used  Tobacco Comment   07/30/2017 Quit in 2014    Goals Met:  Independence with exercise equipment Exercise tolerated well No report of cardiac concerns or symptoms Strength training completed today  Goals Unmet:  Not Applicable  Comments: Pt able to follow exercise prescription today without complaint.  Will continue to monitor for progression.   Dr. Emily Filbert is Medical Director for [place] and [facility].

## 2017-10-12 DIAGNOSIS — I5022 Chronic systolic (congestive) heart failure: Secondary | ICD-10-CM | POA: Diagnosis not present

## 2017-10-12 NOTE — Progress Notes (Signed)
Daily Session Note  Patient Details  Name: Carl [name] MRN: [id number] Date of Birth: [birth year] Referring Provider:     Cardiac Rehab from 07/30/2017 in [facility]  Referring Provider  [name]      Encounter Date: 10/12/2017  Check In: Session Check In - 10/12/17 1623      Check-In   Location  [facility]    Staff Present  Earlean Shawl, BS, ACSM CEP, Exercise Physiologist;Amanda Oletta Darter, BA, ACSM CEP, Exercise Physiologist;Carroll Enterkin, RN, BSN    Supervising physician immediately available to respond to emergencies  See telemetry face sheet for immediately available ER MD    Medication changes reported      No    Fall or balance concerns reported     No    Warm-up and Cool-down  Performed on first and last piece of equipment    Resistance Training Performed  Yes    VAD Patient?  No      Pain Assessment   Currently in Pain?  No/denies    Multiple Pain Sites  No          Social History   Tobacco Use  Smoking Status Former Smoker  . Last attempt to quit: 2014  . Years since quitting: 5.1  Smokeless Tobacco Never Used  Tobacco Comment   07/30/2017 Quit in 2014    Goals Met:  Independence with exercise equipment Exercise tolerated well No report of cardiac concerns or symptoms Strength training completed today  Goals Unmet:  Not Applicable  Comments: Pt able to follow exercise prescription today without complaint.  Will continue to monitor for progression.    Dr. Emily Filbert is Medical Director for [place] and [facility].

## 2017-10-14 ENCOUNTER — Encounter: Payer: Medicare Other | Admitting: *Deleted

## 2017-10-14 DIAGNOSIS — I5022 Chronic systolic (congestive) heart failure: Secondary | ICD-10-CM | POA: Diagnosis not present

## 2017-10-14 NOTE — Progress Notes (Signed)
Daily Session Note  Patient Details  Name: Carl [name] MRN: [id number] Date of Birth: 1951-02-07 Referring Provider:     Cardiac Rehab from 07/30/2017 in [facility]  Referring Provider  [name]      Encounter Date: 10/14/2017  Check In: Session Check In - 10/14/17 1631      Check-In   Location  [facility]    Staff Present  Renita Papa, RN Vickki Hearing, BA, ACSM CEP, Exercise Physiologist;Carroll Enterkin, RN, BSN    Supervising physician immediately available to respond to emergencies  See telemetry face sheet for immediately available ER MD    Medication changes reported      No    Fall or balance concerns reported     No    Warm-up and Cool-down  Performed on first and last piece of equipment    Resistance Training Performed  Yes    VAD Patient?  No      Pain Assessment   Currently in Pain?  No/denies          Social History   Tobacco Use  Smoking Status Former Smoker  . Last attempt to quit: 2014  . Years since quitting: 5.1  Smokeless Tobacco Never Used  Tobacco Comment   07/30/2017 Quit in 2014    Goals Met:  Independence with exercise equipment Exercise tolerated well No report of cardiac concerns or symptoms Strength training completed today  Goals Unmet:  Not Applicable  Comments: Pt able to follow exercise prescription today without complaint.  Will continue to monitor for progression.    Dr. Emily Filbert is Medical Director for [place] and [facility].

## 2017-10-15 ENCOUNTER — Encounter: Payer: Medicare Other | Admitting: *Deleted

## 2017-10-15 VITALS — Ht 71.75 in | Wt 185.6 lb

## 2017-10-15 DIAGNOSIS — I5022 Chronic systolic (congestive) heart failure: Secondary | ICD-10-CM | POA: Diagnosis not present

## 2017-10-15 NOTE — Progress Notes (Signed)
Daily Session Note  Patient Details  Name: Carl [name] MRN: [id number] Date of Birth: 17-Aug-1951 Referring Provider:     Cardiac Rehab from 07/30/2017 in [facility]  Referring Provider  Carl [name]      Encounter Date: 10/15/2017  Check In: Session Check In - 10/15/17 1630      Check-In   Staff Present  Carl Papa, RN Carl [name], BS, ACSM CEP, Exercise Physiologist;Carl [name], [place], ACSM CEP, Exercise Physiologist;Carl [name]    Supervising physician immediately available to respond to emergencies  See telemetry face sheet for immediately available ER MD    Medication changes reported      No    Fall or balance concerns reported     No    Warm-up and Cool-down  Performed on first and last piece of equipment    Resistance Training Performed  Yes    VAD Patient?  No      Pain Assessment   Currently in Pain?  No/denies          Social History   Tobacco Use  Smoking Status Former Smoker  . Last attempt to quit: 2014  . Years since quitting: 5.1  Smokeless Tobacco Never Used  Tobacco Comment   07/30/2017 Quit in 2014    Goals Met:  Exercise tolerated well No report of cardiac concerns or symptoms Strength training completed today  Goals Unmet:  Not Applicable  Comments:  Carl [name] Name 07/30/17 1434 10/15/17 1719       6 Minute Walk   Phase  -  Discharge    Distance  1425 feet  1512 feet    Distance % Change  -  6 %    Distance Feet Change  -  87 ft    Walk Time  6 minutes  6 minutes    # of Rest Breaks  0  0    MPH  2.7  2.86    METS  2.93  3.65    RPE  15  11    Perceived Dyspnea   0  -    VO2 Peak  10.26  12.77    Symptoms  Yes (comment)  Yes (comment)    Comments  L leg broken twice and hurts  Leg pain 3/10    Resting HR  70 bpm  84 bpm    Resting BP  92/60  110/58    Max Ex. HR  84 bpm  91 bpm    Max Ex. BP  118/64  136/62    2 Minute Post BP  106/64  -      Carl [name] graduated today from   rehab with 36 sessions completed.  Details of the patient's exercise prescription and what He needs to do in order to continue the prescription and progress were discussed with patient.  Patient was given a copy of prescription and goals.  Patient verbalized understanding.  Carl [name] plans to continue to exercise by Going to [employer].    Dr. Emily [name] is Medical Director for [place] and [facility].

## 2017-10-15 NOTE — Patient Instructions (Signed)
Discharge Patient Instructions  Patient Details  Name: Carl [name] MRN: [id number] Date of Birth: 18-Jul-1951 Referring Provider:  [name], MD   Number of Visits: 36/36  Reason for Discharge:  Patient reached a stable level of exercise. Patient independent in their exercise. Patient has met program and personal goals.  Smoking History:  Social History   Tobacco Use  Smoking Status Former Smoker  . Last attempt to quit: 2014  . Years since quitting: 5.1  Smokeless Tobacco Never Used  Tobacco Comment   07/30/2017 Quit in 2014    Diagnosis:  Heart failure, chronic systolic (HCC)  Initial Exercise Prescription: Initial Exercise Prescription - 07/30/17 1400      Date of Initial Exercise RX and Referring Provider   Date  07/30/17    Referring Provider  [name]      Treadmill   MPH  2.3    Grade  0.5    Minutes  15    METs  2.92      Recumbant Bike   Level  2    RPM  60    Watts  20    Minutes  15    METs  2.9      NuStep   Level  3    SPM  80    Minutes  15    METs  2.9      Prescription Details   Frequency (times per week)  3    Duration  Progress to 45 minutes of aerobic exercise without signs/symptoms of physical distress      Intensity   THRR 40-80% of Max Heartrate  98-126    Ratings of Perceived Exertion  11-13    Perceived Dyspnea  0-4      Resistance Training   Training Prescription  Yes    Weight  3 lb    Reps  10-15       Discharge Exercise Prescription (Final Exercise Prescription Changes): Exercise Prescription Changes - 10/13/17 1000      Response to Exercise   Blood Pressure (Admit)  128/66    Blood Pressure (Exercise)  98/54    Blood Pressure (Exit)  90/52    Heart Rate (Admit)  80 bpm    Heart Rate (Exercise)  92 bpm    Heart Rate (Exit)  74 bpm    Rating of Perceived Exertion (Exercise)  13    Duration  Continue with 45 min of aerobic exercise without signs/symptoms of physical distress.    Intensity  THRR  unchanged      Progression   Progression  Continue to progress workloads to maintain intensity without signs/symptoms of physical distress.    Average METs  2.5      Resistance Training   Training Prescription  Yes    Weight  3 lb    Reps  10-15      Interval Training   Interval Training  No      Home Exercise Plan   Plans to continue exercise at  Home (comment)    Frequency  Add 1 additional day to program exercise sessions.    Initial Home Exercises Provided  09/03/17       Functional Capacity: 6 Minute Walk    Row Name 07/30/17 1434         6 Minute Walk   Distance  1425 feet     Walk Time  6 minutes     # of Rest Breaks  0  MPH  2.7     METS  2.93     RPE  15     Perceived Dyspnea   0     VO2 Peak  10.26     Symptoms  Yes (comment)     Comments  L leg broken twice and hurts     Resting HR  70 bpm     Resting BP  92/60     Max Ex. HR  84 bpm     Max Ex. BP  118/64     2 Minute Post BP  106/64        Quality of Life: Quality of Life - 07/30/17 1327      Quality of Life Scores   Health/Function Pre  21 %    Socioeconomic Pre  20.67 %    Psych/Spiritual Pre  21 %    Family Pre  21 %    GLOBAL Pre  20.94 %       Personal Goals: Goals established at orientation with interventions provided to work toward goal. Personal Goals and Risk Factors at Admission - 07/30/17 1326      Core Components/Risk Factors/Patient Goals on Admission   Heart Failure  Yes    Intervention  Provide a combined exercise and nutrition program that is supplemented with education, support and counseling about heart failure. Directed toward relieving symptoms such as shortness of breath, decreased exercise tolerance, and extremity edema.    Expected Outcomes  Improve functional capacity of life;Short term: Attendance in program 2-3 days a week with increased exercise capacity. Reported lower sodium intake. Reported increased fruit and vegetable intake. Reports medication  compliance.;Short term: Daily weights obtained and reported for increase. Utilizing diuretic protocols set by physician.;Long term: Adoption of self-care skills and reduction of barriers for early signs and symptoms recognition and intervention leading to self-care maintenance.    Lipids  Yes    Intervention  Provide education and support for participant on nutrition & aerobic/resistive exercise along with prescribed medications to achieve LDL '70mg'$ , HDL >'40mg'$ .    Expected Outcomes  Short Term: Participant states understanding of desired cholesterol values and is compliant with medications prescribed. Participant is following exercise prescription and nutrition guidelines.;Long Term: Cholesterol controlled with medications as prescribed, with individualized exercise RX and with personalized nutrition plan. Value goals: LDL < '70mg'$ , HDL > 40 mg.        Personal Goals Discharge: Goals and Risk Factor Review - 10/07/17 1643      Core Components/Risk Factors/Patient Goals Review   Personal Goals Review  Weight Management/Obesity;Hypertension;Heart Failure;Other    Review  [place] is seeing his Dr next week to follow up about leg pain.  He will ask about his arms as well or see a chiropractor.  He states he is drinking only a little soda at this time.  He gets cals from a nurse with [facility] to check on his status    Expected Outcomes  Short - Ronald will see his Dr 2/19 about the leg pain/possible PAD  Long - Griffyn will manage conditions with nurse assistance and continue exercise       Exercise Goals and Review: Exercise Goals    Row Name 07/30/17 1434             Exercise Goals   Increase Physical Activity  Yes       Intervention  Provide advice, education, support and counseling about physical activity/exercise needs.;Develop an individualized exercise prescription for aerobic and resistive training  based on initial evaluation findings, risk stratification, comorbidities and participant's personal  goals.       Expected Outcomes  Achievement of increased cardiorespiratory fitness and enhanced flexibility, muscular endurance and strength shown through measurements of functional capacity and personal statement of participant.       Increase Strength and Stamina  Yes       Intervention  Provide advice, education, support and counseling about physical activity/exercise needs.;Develop an individualized exercise prescription for aerobic and resistive training based on initial evaluation findings, risk stratification, comorbidities and participant's personal goals.       Expected Outcomes  Achievement of increased cardiorespiratory fitness and enhanced flexibility, muscular endurance and strength shown through measurements of functional capacity and personal statement of participant.       Able to understand and use rate of perceived exertion (RPE) scale  Yes       Intervention  Provide education and explanation on how to use RPE scale       Expected Outcomes  Short Term: Able to use RPE daily in rehab to express subjective intensity level;Long Term:  Able to use RPE to guide intensity level when exercising independently       Able to understand and use Dyspnea scale  Yes       Intervention  Provide education and explanation on how to use Dyspnea scale       Expected Outcomes  Short Term: Able to use Dyspnea scale daily in rehab to express subjective sense of shortness of breath during exertion;Long Term: Able to use Dyspnea scale to guide intensity level when exercising independently       Knowledge and understanding of Target Heart Rate Range (THRR)  Yes       Intervention  Provide education and explanation of THRR including how the numbers were predicted and where they are located for reference       Expected Outcomes  Short Term: Able to state/look up THRR;Long Term: Able to use THRR to govern intensity when exercising independently;Short Term: Able to use daily as guideline for intensity in rehab        Able to check pulse independently  Yes       Intervention  Provide education and demonstration on how to check pulse in carotid and radial arteries.;Review the importance of being able to check your own pulse for safety during independent exercise       Expected Outcomes  Short Term: Able to explain why pulse checking is important during independent exercise;Long Term: Able to check pulse independently and accurately       Understanding of Exercise Prescription  Yes          Nutrition & Weight - Outcomes: Pre Biometrics - 07/30/17 1433      Pre Biometrics   Height  5' 11.75" (1.822 m)    Weight  175 lb 8 oz (79.6 kg)    Waist Circumference  37.5 inches    Hip Circumference  42 inches    Waist to Hip Ratio  0.89 %    BMI (Calculated)  23.98    Single Leg Stand  3.04 seconds        Nutrition: Nutrition Therapy & Goals - 09/14/17 1646      Nutrition Therapy   RD appointment deferred  Yes       Nutrition Discharge:   Education Questionnaire Score: Knowledge Questionnaire Score - 07/30/17 1330      Knowledge Questionnaire Score   Pre Score  22/28  Goals reviewed with patient; copy given to patient.

## 2017-10-15 NOTE — Progress Notes (Signed)
Cardiac Individual Treatment Plan  Patient Details  Name: Carl [name] MRN: [id number] Date of Birth: 20-Jul-1951 Referring Provider:     Cardiac Rehab from 07/30/2017 in [facility] Cardiac and Pulmonary Rehab  Referring Provider  [name]      Initial Encounter Date:    Cardiac Rehab from 07/30/2017 in [facility] Cardiac and Pulmonary Rehab  Date  07/30/17  Referring Provider  [name]      Visit Diagnosis: Heart failure, chronic systolic ([place])  Patient's Home Medications on Admission:  Current Outpatient Medications:  .  acetaminophen (TYLENOL) 650 MG CR tablet, Take by mouth., Disp: , Rfl:  .  aluminum-magnesium hydroxide-simethicone (MAALOX) 478-295-62 MG/5ML SUSP, Take 30 mLs by mouth 4 (four) times daily -  before meals and at bedtime., Disp: 355 mL, Rfl: 0 .  aspirin 81 MG tablet, Take 1 tablet (81 mg total) by mouth daily., Disp: 30 tablet, Rfl: 11 .  atorvastatin (LIPITOR) 40 MG tablet, Take 1 tablet (40 mg total) by mouth daily at 6 PM., Disp: 30 tablet, Rfl: 1 .  carvedilol (COREG) 3.125 MG tablet, Take 3.125 mg by mouth 2 (two) times daily with a meal., Disp: , Rfl:  .  citalopram (CELEXA) 20 MG tablet, Take 1 tablet by mouth daily., Disp: , Rfl: 5 .  cyanocobalamin 500 MCG tablet, Take 500 mcg by mouth daily., Disp: , Rfl:  .  eplerenone (INSPRA) 25 MG tablet, Take 1 tablet (25 mg total) by mouth daily., Disp: 30 tablet, Rfl: 6 .  ferrous sulfate 325 (65 FE) MG tablet, Take 325 mg by mouth daily with breakfast., Disp: , Rfl:  .  furosemide (LASIX) 80 MG tablet, Take 1 tablet (80 mg total) by mouth 2 (two) times daily., Disp: 60 tablet, Rfl: 6 .  losartan (COZAAR) 25 MG tablet, Take 0.5 tablets (12.5 mg total) daily by mouth., Disp: , Rfl:  .  meloxicam (MOBIC) 7.5 MG tablet, Take 7.5 mg daily by mouth. Brother reported patient is no longer taking., Disp: , Rfl:  .  omeprazole (PRILOSEC) 20 MG capsule, Take 20 mg by mouth 2 (two) times daily., Disp: , Rfl:  .  potassium chloride SA  (KLOR-CON M20) 20 MEQ tablet, Take 1 tablet (20 mEq total) 2 (two) times daily by mouth., Disp: 60 tablet, Rfl: 3  Past Medical History: Past Medical History:  Diagnosis Date  . Anemia   . Anxiety   . Arthritis   . Brunner's gland hyperplasia of duodenum   . CHF (congestive heart failure) ([place])   . Coronary artery disease   . External hemorrhoids   . GERD (gastroesophageal reflux disease)   . Hearing loss   . HH (hiatus hernia)   . Internal hemorrhoids   . Ischemic cardiomyopathy   . Leg fracture, left   . Myocardial infarction ([place])   . Paronychia   . Psoriasis   . PUD (peptic ulcer disease)   . Schatzki's ring   . Tubular adenoma of colon   . Vitiligo     Tobacco Use: Social History   Tobacco Use  Smoking Status Former Smoker  . Last attempt to quit: 2014  . Years since quitting: 5.1  Smokeless Tobacco Never Used  Tobacco Comment   07/30/2017 Quit in 2014    Labs: Recent Review Flowsheet Data    Labs for ITP Cardiac and Pulmonary Rehab Latest Ref Rng & Units 08/13/2014 08/14/2014 08/15/2014 11/07/2014 06/11/2017   Cholestrol 0 - 200 mg/dL - - - 109 -   LDLCALC  0 - 99 mg/dL - - - 62 -   HDL >39 mg/dL - - - 40 -   Trlycerides <150 mg/dL - - - 34 -   Hemoglobin A1c 4.8 - 5.6 % - - - - 5.3   PHART 7.350 - 7.450 - - - - -   PCO2ART 35.0 - 45.0 mmHg - - - - -   HCO3 20.0 - 24.0 mEq/L - - - - -   TCO2 0 - 100 mmol/L - - - - -   ACIDBASEDEF 0.0 - 2.0 mmol/L - - - - -   O2SAT % 59.3 80.0 64.5 - -       Exercise Target Goals:    Exercise Program Goal: Individual exercise prescription set using results from initial 6 min walk test and THRR while considering  patient's activity barriers and safety.   Exercise Prescription Goal: Initial exercise prescription builds to 30-45 minutes a day of aerobic activity, 2-3 days per week.  Home exercise guidelines will be given to patient during program as part of exercise prescription that the participant will  acknowledge.  Activity Barriers & Risk Stratification: Activity Barriers & Cardiac Risk Stratification - 07/30/17 1323      Activity Barriers & Cardiac Risk Stratification   Activity Barriers  Back Problems;Other (comment)    Comments  muscle aches   arms and back    Cardiac Risk Stratification  High       6 Minute Walk: 6 Minute Walk    Row Name 07/30/17 1434 10/15/17 1719       6 Minute Walk   Phase  -  Discharge    Distance  1425 feet  1512 feet    Distance % Change  -  6 %    Distance Feet Change  -  87 ft    Walk Time  6 minutes  6 minutes    # of Rest Breaks  0  0    MPH  2.7  2.86    METS  2.93  3.65    RPE  15  11    Perceived Dyspnea   0  -    VO2 Peak  10.26  12.77    Symptoms  Yes (comment)  Yes (comment)    Comments  L leg broken twice and hurts  Leg pain 3/10    Resting HR  70 bpm  84 bpm    Resting BP  92/60  110/58    Max Ex. HR  84 bpm  91 bpm    Max Ex. BP  118/64  136/62    2 Minute Post BP  106/64  -       Oxygen Initial Assessment:   Oxygen Re-Evaluation:   Oxygen Discharge (Final Oxygen Re-Evaluation):   Initial Exercise Prescription: Initial Exercise Prescription - 07/30/17 1400      Date of Initial Exercise RX and Referring Provider   Date  07/30/17    Referring Provider  [name]      Treadmill   MPH  2.3    Grade  0.5    Minutes  15    METs  2.92      Recumbant Bike   Level  2    RPM  60    Watts  20    Minutes  15    METs  2.9      NuStep   Level  3    SPM  80    Minutes  15  METs  2.9      Prescription Details   Frequency (times per week)  3    Duration  Progress to 45 minutes of aerobic exercise without signs/symptoms of physical distress      Intensity   THRR 40-80% of Max Heartrate  98-126    Ratings of Perceived Exertion  11-13    Perceived Dyspnea  0-4      Resistance Training   Training Prescription  Yes    Weight  3 lb    Reps  10-15       Perform Capillary Blood Glucose checks as  needed.  Exercise Prescription Changes: Exercise Prescription Changes    Row Name 07/30/17 1250 08/19/17 1100 09/02/17 1100 09/03/17 1600 09/15/17 1000     Response to Exercise   Blood Pressure (Admit)  92/60  104/62  90/42  -  94/62   Blood Pressure (Exercise)  118/64  122/72  112/60  -  114/66   Blood Pressure (Exit)  106/64  118/70  128/70  -  94/55   Heart Rate (Admit)  70 bpm  80 bpm  82 bpm  -  80 bpm   Heart Rate (Exercise)  105 bpm  110 bpm  110 bpm  -  107 bpm   Heart Rate (Exit)  74 bpm  92 bpm  78 bpm  -  80 bpm   Rating of Perceived Exertion (Exercise)  -  13  15  -  15   Symptoms  -  none  leg pain - see note  -  -   Duration  -  Progress to 45 minutes of aerobic exercise without signs/symptoms of physical distress  Progress to 45 minutes of aerobic exercise without signs/symptoms of physical distress  -  Progress to 45 minutes of aerobic exercise without signs/symptoms of physical distress   Intensity  -  THRR unchanged  THRR unchanged  -  THRR unchanged     Progression   Progression  -  Continue to progress workloads to maintain intensity without signs/symptoms of physical distress.  Continue to progress workloads to maintain intensity without signs/symptoms of physical distress.  -  Continue to progress workloads to maintain intensity without signs/symptoms of physical distress.   Average METs  -  2.93  2.2  -  2.64     Resistance Training   Training Prescription  -  Yes  Yes  -  Yes   Weight  -  3 lb  3 lb  -  3 lb   Reps  -  10-15  10-15  -  10-15     Interval Training   Interval Training  -  No  No  -  No     Treadmill   MPH  -  2.3  -  -  1.7   Grade  -  0.5  -  -  0.5   Minutes  -  15  -  -  15   METs  -  3.08  -  -  2.54     Recumbant Bike   Level  -  2  -  -  2   RPM  -  60  -  -  60   Watts  -  28  -  -  20   Minutes  -  15  -  -  15   METs  -  2.77  -  -  2.54     NuStep  Level  -  -  3  -  -   SPM  -  -  80  -  -   Minutes  -  -  15  -  -    METs  -  -  2.2  -  -     Home Exercise Plan   Plans to continue exercise at  -  -  -  Home (comment)  Home (comment)   Frequency  -  -  -  Add 1 additional day to program exercise sessions.  Add 1 additional day to program exercise sessions.   Initial Home Exercises Provided  -  -  -  09/03/17  09/03/17   Row Name 09/30/17 1100 10/13/17 1000           Response to Exercise   Blood Pressure (Admit)  102/60  128/66      Blood Pressure (Exercise)  132/72  98/54      Blood Pressure (Exit)  114/62  90/52      Heart Rate (Admit)  61 bpm  80 bpm      Heart Rate (Exercise)  109 bpm  92 bpm      Heart Rate (Exit)  92 bpm  74 bpm      Rating of Perceived Exertion (Exercise)  13  13      Duration  Progress to 45 minutes of aerobic exercise without signs/symptoms of physical distress  Continue with 45 min of aerobic exercise without signs/symptoms of physical distress.      Intensity  THRR unchanged  THRR unchanged        Progression   Progression  Continue to progress workloads to maintain intensity without signs/symptoms of physical distress.  Continue to progress workloads to maintain intensity without signs/symptoms of physical distress.      Average METs  2.4  2.5        Resistance Training   Training Prescription  Yes  Yes      Weight  3 lb  3 lb      Reps  10-15  10-15        Interval Training   Interval Training  No  No        Recumbant Bike   Level  2  -      RPM  60  -      Watts  13  -      Minutes  15  -      METs  2.75  -        NuStep   Level  3  -      SPM  80  -      Minutes  15  -      METs  2  -        Home Exercise Plan   Plans to continue exercise at  Home (comment)  Home (comment)      Frequency  Add 1 additional day to program exercise sessions.  Add 1 additional day to program exercise sessions.      Initial Home Exercises Provided  09/03/17  09/03/17         Exercise Comments: Exercise Comments    Row Name 08/10/17 1623 09/03/17 1648          Exercise Comments  First full day of exercise!  Patient was oriented to gym and equipment including functions, settings, policies, and procedures.  Patient's individual exercise prescription and treatment plan were  reviewed.  All starting workloads were established based on the results of the 6 minute walk test done at initial orientation visit.  The plan for exercise progression was also introduced and progression will be customized based on patient's performance and goals  Reviewed home exercise with pt today.  Pt plans to walk for exercise.  Reviewed THR, pulse, RPE, sign and symptoms, NTG use, and when to call 911 or MD.  Also discussed weather considerations and indoor options.  Pt voiced understanding.         Exercise Goals and Review: Exercise Goals    Row Name 07/30/17 1434             Exercise Goals   Increase Physical Activity  Yes       Intervention  Provide advice, education, support and counseling about physical activity/exercise needs.;Develop an individualized exercise prescription for aerobic and resistive training based on initial evaluation findings, risk stratification, comorbidities and participant's personal goals.       Expected Outcomes  Achievement of increased cardiorespiratory fitness and enhanced flexibility, muscular endurance and strength shown through measurements of functional capacity and personal statement of participant.       Increase Strength and Stamina  Yes       Intervention  Provide advice, education, support and counseling about physical activity/exercise needs.;Develop an individualized exercise prescription for aerobic and resistive training based on initial evaluation findings, risk stratification, comorbidities and participant's personal goals.       Expected Outcomes  Achievement of increased cardiorespiratory fitness and enhanced flexibility, muscular endurance and strength shown through measurements of functional capacity and personal statement of  participant.       Able to understand and use rate of perceived exertion (RPE) scale  Yes       Intervention  Provide education and explanation on how to use RPE scale       Expected Outcomes  Short Term: Able to use RPE daily in rehab to express subjective intensity level;Long Term:  Able to use RPE to guide intensity level when exercising independently       Able to understand and use Dyspnea scale  Yes       Intervention  Provide education and explanation on how to use Dyspnea scale       Expected Outcomes  Short Term: Able to use Dyspnea scale daily in rehab to express subjective sense of shortness of breath during exertion;Long Term: Able to use Dyspnea scale to guide intensity level when exercising independently       Knowledge and understanding of Target Heart Rate Range (THRR)  Yes       Intervention  Provide education and explanation of THRR including how the numbers were predicted and where they are located for reference       Expected Outcomes  Short Term: Able to state/look up THRR;Long Term: Able to use THRR to govern intensity when exercising independently;Short Term: Able to use daily as guideline for intensity in rehab       Able to check pulse independently  Yes       Intervention  Provide education and demonstration on how to check pulse in carotid and radial arteries.;Review the importance of being able to check your own pulse for safety during independent exercise       Expected Outcomes  Short Term: Able to explain why pulse checking is important during independent exercise;Long Term: Able to check pulse independently and accurately       Understanding of  Exercise Prescription  Yes          Exercise Goals Re-Evaluation : Exercise Goals Re-Evaluation    Row Name 09/02/17 1127 09/03/17 1648 09/15/17 1052 09/30/17 1159 10/13/17 1033     Exercise Goal Re-Evaluation   Exercise Goals Review  Increase Physical Activity;Increase Strength and Stamina  Increase Physical  Activity;Increase Strength and Stamina  Increase Physical Activity;Increase Strength and Stamina;Able to understand and use rate of perceived exertion (RPE) scale  Increase Physical Activity;Increase Strength and Stamina;Able to understand and use rate of perceived exertion (RPE) scale  Increase Physical Activity;Increase Strength and Stamina   Comments  Walid continues to tolerate exercise well overall.  He is seeing his Dr about R leg pain.  Reviewed home exercise with pt today.  Pt plans to walk for exercise.  Reviewed THR, pulse, RPE, sign and symptoms, NTG use, and when to call 911 or MD.  Also discussed weather considerations and indoor options.  Pt voiced understanding.  Kairos continues to tolerate exercise well.  Staff will monitor as he has seen his Dr for PAD symptoms.    Keelan tolerates exercise well and reaches his THR range.  Staff will continue to monitor.  Waller tolerates exercise well.  Due to cognitive function, he has not made progress in increasing intensity.  Staff will continue to monitor.   Expected Outcomes  Short - Flora will continue to attend regularly Long - Rebel will complete HT program  Short - PT will walk at home one day per week Long - Pt will maintain exercise on his own  Short - Arben will continue to attend Long - Ethanael will maintain fitness on his own  Short - Friend will complete HT  Long - Zachary will maintain exericse on his own  Short - Manvir will continue to attend class Long - Sylar will continue to be active on his own      Discharge Exercise Prescription (Final Exercise Prescription Changes): Exercise Prescription Changes - 10/13/17 1000      Response to Exercise   Blood Pressure (Admit)  128/66    Blood Pressure (Exercise)  98/54    Blood Pressure (Exit)  90/52    Heart Rate (Admit)  80 bpm    Heart Rate (Exercise)  92 bpm    Heart Rate (Exit)  74 bpm    Rating of Perceived Exertion (Exercise)  13    Duration  Continue with 45 min of aerobic exercise  without signs/symptoms of physical distress.    Intensity  THRR unchanged      Progression   Progression  Continue to progress workloads to maintain intensity without signs/symptoms of physical distress.    Average METs  2.5      Resistance Training   Training Prescription  Yes    Weight  3 lb    Reps  10-15      Interval Training   Interval Training  No      Home Exercise Plan   Plans to continue exercise at  Home (comment)    Frequency  Add 1 additional day to program exercise sessions.    Initial Home Exercises Provided  09/03/17       Nutrition:  Target Goals: Understanding of nutrition guidelines, daily intake of sodium <1576m, cholesterol <2011m calories 30% from fat and 7% or less from saturated fats, daily to have 5 or more servings of fruits and vegetables.  Biometrics: Pre Biometrics - 07/30/17 1433      Pre Biometrics  Height  5' 11.75" (1.822 m)    Weight  175 lb 8 oz (79.6 kg)    Waist Circumference  37.5 inches    Hip Circumference  42 inches    Waist to Hip Ratio  0.89 %    BMI (Calculated)  23.98    Single Leg Stand  3.04 seconds      Post Biometrics - 10/15/17 1722       Post  Biometrics   Height  5' 11.75" (1.822 m)    Weight  185 lb 9.6 oz (84.2 kg)    Waist Circumference  38 inches    Hip Circumference  41.5 inches    Waist to Hip Ratio  0.92 %    BMI (Calculated)  25.36    Single Leg Stand  4.85 seconds       Nutrition Therapy Plan and Nutrition Goals: Nutrition Therapy & Goals - 09/14/17 1646      Nutrition Therapy   RD appointment deferred  Yes       Nutrition Assessments:   Nutrition Goals Re-Evaluation: Nutrition Goals Re-Evaluation    [place] Name 10/07/17 1646             Goals   Nutrition Goal  Not eating salty things but ankle swelling, not short of breath and weight up 2 lbs.           Nutrition Goals Discharge (Final Nutrition Goals Re-Evaluation): Nutrition Goals Re-Evaluation - 10/07/17 1646      Goals    Nutrition Goal  Not eating salty things but ankle swelling, not short of breath and weight up 2 lbs.        Psychosocial: Target Goals: Acknowledge presence or absence of significant depression and/or stress, maximize coping skills, provide positive support system. Participant is able to verbalize types and ability to use techniques and skills needed for reducing stress and depression.   Initial Review & Psychosocial Screening: Initial Psych Review & Screening - 07/30/17 1326      Initial Review   Current issues with  None Identified      Family Dynamics   Good Support System?  Yes Sister and Brother help [place]    Concerns  Recent loss of significant other    Comments  Mother passed away this year.        Barriers   Psychosocial barriers to participate in program  There are no identifiable barriers or psychosocial needs.;The patient should benefit from training in stress management and relaxation.      Screening Interventions   Interventions  Yes;Encouraged to exercise    Expected Outcomes  Short Term goal: Utilizing psychosocial counselor, staff and physician to assist with identification of specific Stressors or current issues interfering with healing process. Setting desired goal for each stressor or current issue identified.;Long Term Goal: Stressors or current issues are controlled or eliminated.;Short Term goal: Identification and review with participant of any Quality of Life or Depression concerns found by scoring the questionnaire.;Long Term goal: The participant improves quality of Life and PHQ9 Scores as seen by post scores and/or verbalization of changes       Quality of Life Scores:  Quality of Life - 07/30/17 1327      Quality of Life Scores   Health/Function Pre  21 %    Socioeconomic Pre  20.67 %    Psych/Spiritual Pre  21 %    Family Pre  21 %    GLOBAL Pre  20.94 %  Scores of 19 and below usually indicate a poorer quality of life in these areas.  A  difference of  2-3 points is a clinically meaningful difference.  A difference of 2-3 points in the total score of the Quality of Life Index has been associated with significant improvement in overall quality of life, self-image, physical symptoms, and general health in studies assessing change in quality of life.  PHQ-9: Recent Review Flowsheet Data    Depression screen [facility] 2/9 07/30/2017   Decreased Interest 0   Down, Depressed, Hopeless 0   PHQ - 2 Score 0   Altered sleeping 0   Tired, decreased energy 0   Change in appetite 0   Feeling bad or failure about yourself  0   Trouble concentrating 0   Moving slowly or fidgety/restless 0   Suicidal thoughts 0   PHQ-9 Score 0   Difficult doing work/chores Not difficult at all     Interpretation of Total Score  Total Score Depression Severity:  1-4 = Minimal depression, 5-9 = Mild depression, 10-14 = Moderate depression, 15-19 = Moderately severe depression, 20-27 = Severe depression   Psychosocial Evaluation and Intervention: Psychosocial Evaluation - 08/12/17 1708      Psychosocial Evaluation & Interventions   Comments  Mr. Cleland returned to this program after 3 years.  Counselor met with him for an updated evaluation.  He is now 67 years old and reports his mother who was caring for him has passed away several months ago.  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  He states his sister and brother live close by and his aunt and uncle are coming in from [place] to be here with him over the holidays.  [place] states he sleeps well and has a good appetite.  He denies depression or anxiety but continues to be on medications typically given for treating these symptoms.  Edis states his current stressors are he has a court appearance on 12/29 due to "DWI" having admitted to taking some pain pills to the officer.  Rashad has goals to get stronger and be able to do more normal things as his goals while in this program.       Expected Outcomes  Nobuo will benefit from consistent exercise to achieve his stated goals.  The support from staff and other patients will be helpful for him during this time of loss over the holidays.  Staff will follow.     Continue Psychosocial Services   Follow up required by staff       Psychosocial Re-Evaluation: Psychosocial Re-Evaluation    [place] Name 09/14/17 1646             Psychosocial Re-Evaluation   Current issues with  None Identified       Comments  No new stress concerns       Interventions  Encouraged to attend Cardiac Rehabilitation for the exercise;Stress management education       Continue Psychosocial Services   Follow up required by staff          Psychosocial Discharge (Final Psychosocial Re-Evaluation): Psychosocial Re-Evaluation - 09/14/17 1646      Psychosocial Re-Evaluation   Current issues with  None Identified    Comments  No new stress concerns    Interventions  Encouraged to attend Cardiac Rehabilitation for the exercise;Stress management education    Continue Psychosocial Services   Follow up required by staff       Vocational Rehabilitation: Provide vocational rehab  assistance to qualifying candidates.   Vocational Rehab Evaluation & Intervention: Vocational Rehab - 07/30/17 1330      Initial Vocational Rehab Evaluation & Intervention   Assessment shows need for Vocational Rehabilitation  No       Education: Education Goals: Education classes will be provided on a variety of topics geared toward better understanding of heart health and risk factor modification. Participant will state understanding/return demonstration of topics presented as noted by education test scores.  Learning Barriers/Preferences: Learning Barriers/Preferences - 07/30/17 1329      Learning Barriers/Preferences   Learning Barriers  Reading;Inability to learn new things;Hearing Slow understanding     Learning Preferences  Audio;Individual Instruction;Verbal  Instruction;Group Instruction       Education Topics:  AED/CPR: - Group verbal and written instruction with the use of models to demonstrate the basic use of the AED with the basic ABC's of resuscitation.   Cardiac Rehab from 10/14/2017 in [facility] Cardiac and Pulmonary Rehab  Date  10/05/17  Educator  MA  Instruction Review Code  1- Verbalizes Understanding      General Nutrition Guidelines/Fats and Fiber: -Group instruction provided by verbal, written material, models and posters to present the general guidelines for heart healthy nutrition. Gives an explanation and review of dietary fats and fiber.   Cardiac Rehab from 10/14/2017 in [facility] Cardiac and Pulmonary Rehab  Date  08/10/17  Educator  PI  Instruction Review Code  1- Verbalizes Understanding      Controlling Sodium/Reading Food Labels: -Group verbal and written material supporting the discussion of sodium use in heart healthy nutrition. Review and explanation with models, verbal and written materials for utilization of the food label.   Cardiac Rehab from 10/14/2017 in [facility] Cardiac and Pulmonary Rehab  Date  09/28/17  Educator  PI  Instruction Review Code  1- Verbalizes Understanding      Exercise Physiology & General Exercise Guidelines: - Group verbal and written instruction with models to review the exercise physiology of the cardiovascular system and associated critical values. Provides general exercise guidelines with specific guidelines to those with heart or lung disease.    Cardiac Rehab from 10/14/2017 in [facility] Cardiac and Pulmonary Rehab  Date  10/12/17  Educator  [facility]  Instruction Review Code  1- Verbalizes Understanding      Aerobic Exercise & Resistance Training: - Gives group verbal and written instruction on the various components of exercise. Focuses on aerobic and resistive training programs and the benefits of this training and how to safely progress through these programs..   Cardiac Rehab from 10/14/2017  in [name] Cardiac and Pulmonary Rehab  Date  08/26/17  Educator  AS  Instruction Review Code  1- Verbalizes Understanding      Flexibility, Balance, Mind/Body Relaxation: Provides group verbal/written instruction on the benefits of flexibility and balance training, including mind/body exercise modes such as yoga, pilates and tai chi.  Demonstration and skill practice provided.   Cardiac Rehab from 10/14/2017 in [facility] Cardiac and Pulmonary Rehab  Date  08/31/17  Educator  AS      Stress and Anxiety: - Provides group verbal and written instruction about the health risks of elevated stress and causes of high stress.  Discuss the correlation between heart/lung disease and anxiety and treatment options. Review healthy ways to manage with stress and anxiety.   Cardiac Rehab from 02/19/2015 in [facility] Cardiac and Pulmonary Rehab  Date  01/24/15  Educator  [facility]  Instruction Review Code (retired)  2- meets goals/outcomes  Depression: - Provides group verbal and written instruction on the correlation between heart/lung disease and depressed mood, treatment options, and the stigmas associated with seeking treatment.   Cardiac Rehab from 10/14/2017 in [facility] Cardiac and Pulmonary Rehab  Date  10/14/17  Educator  [facility]  Instruction Review Code  1- Verbalizes Understanding      Anatomy & Physiology of the Heart: - Group verbal and written instruction and models provide basic cardiac anatomy and physiology, with the coronary electrical and arterial systems. Review of Valvular disease and Heart Failure   Cardiac Rehab from 10/14/2017 in [facility] Cardiac and Pulmonary Rehab  Date  09/14/17  Educator  CE  Instruction Review Code  1- Verbalizes Understanding      Cardiac Procedures: - Group verbal and written instruction to review commonly prescribed medications for heart disease. Reviews the medication, class of the drug, and side effects. Includes the steps to properly store meds and maintain the  prescription regimen. (beta blockers and nitrates)   Cardiac Rehab from 10/14/2017 in [facility] Cardiac and Pulmonary Rehab  Date  09/23/17  Educator  [facility]  Instruction Review Code  1- Verbalizes Understanding      Cardiac Medications I: - Group verbal and written instruction to review commonly prescribed medications for heart disease. Reviews the medication, class of the drug, and side effects. Includes the steps to properly store meds and maintain the prescription regimen.   Cardiac Rehab from 10/14/2017 in [facility] Cardiac and Pulmonary Rehab  Date  09/07/17  Educator  [facility]  Instruction Review Code  1- Verbalizes Understanding      Cardiac Medications II: -Group verbal and written instruction to review commonly prescribed medications for heart disease. Reviews the medication, class of the drug, and side effects. (all other drug classes)   Cardiac Rehab from 10/14/2017 in [facility] Cardiac and Pulmonary Rehab  Date  09/02/17 [and Risk factors]  Educator  [facility]  Instruction Review Code  1- Verbalizes Understanding       Go Sex-Intimacy & Heart Disease, Get SMART - Goal Setting: - Group verbal and written instruction through game format to discuss heart disease and the return to sexual intimacy. Provides group verbal and written material to discuss and apply goal setting through the application of the S.M.A.R.T. Method.   Cardiac Rehab from 10/14/2017 in [facility] Cardiac and Pulmonary Rehab  Date  09/23/17  Educator  [facility]  Instruction Review Code  1- Verbalizes Understanding      Other Matters of the Heart: - Provides group verbal, written materials and models to describe Stable Angina and Peripheral Artery. Includes description of the disease process and treatment options available to the cardiac patient.   Cardiac Rehab from 02/19/2015 in [facility] Cardiac and Pulmonary Rehab  Date  02/07/15  Educator  CE  Instruction Review Code (retired)  2- meets goals/outcomes      Exercise & Equipment Safety: -  Individual verbal instruction and demonstration of equipment use and safety with use of the equipment.   Cardiac Rehab from 10/14/2017 in [facility] Cardiac and Pulmonary Rehab  Date  07/30/17  Educator  [facility]  Instruction Review Code  1- Verbalizes Understanding      Infection Prevention: - Provides verbal and written material to individual with discussion of infection control including proper hand washing and proper equipment cleaning during exercise session.   Cardiac Rehab from 10/14/2017 in [facility] Cardiac and Pulmonary Rehab  Date  07/30/17  Educator  [facility]  Instruction Review Code  1- Verbalizes Understanding  Falls Prevention: - Provides verbal and written material to individual with discussion of falls prevention and safety.   Cardiac Rehab from 10/14/2017 in [facility] Cardiac and Pulmonary Rehab  Date  07/30/17  Educator  [facility]  Instruction Review Code  1- Verbalizes Understanding      Diabetes: - Individual verbal and written instruction to review signs/symptoms of diabetes, desired ranges of glucose level fasting, after meals and with exercise. Acknowledge that pre and post exercise glucose checks will be done for 3 sessions at entry of program.   Know Your Numbers and Risk Factors: -Group verbal and written instruction about important numbers in your health.  Discussion of what are risk factors and how they play a role in the disease process.  Review of Cholesterol, Blood Pressure, Diabetes, and BMI and the role they play in your overall health.   Cardiac Rehab from 10/14/2017 in [facility] Cardiac and Pulmonary Rehab  Date  09/02/17 [and Risk factors]  Educator  [facility]  Instruction Review Code  1- Verbalizes Understanding      Sleep Hygiene: -Provides group verbal and written instruction about how sleep can affect your health.  Define sleep hygiene, discuss sleep cycles and impact of sleep habits. Review good sleep hygiene tips.    Cardiac Rehab from 10/14/2017 in [facility] Cardiac and Pulmonary  Rehab  Date  09/30/17  Educator  [name]  Instruction Review Code  1- Verbalizes Understanding      Other: -Provides group and verbal instruction on various topics (see comments)   Knowledge Questionnaire Score: Knowledge Questionnaire Score - 07/30/17 1330      Knowledge Questionnaire Score   Pre Score  22/28       Core Components/Risk Factors/Patient Goals at Admission: Personal Goals and Risk Factors at Admission - 07/30/17 1326      Core Components/Risk Factors/Patient Goals on Admission   Heart Failure  Yes    Intervention  Provide a combined exercise and nutrition program that is supplemented with education, support and counseling about heart failure. Directed toward relieving symptoms such as shortness of breath, decreased exercise tolerance, and extremity edema.    Expected Outcomes  Improve functional capacity of life;Short term: Attendance in program 2-3 days a week with increased exercise capacity. Reported lower sodium intake. Reported increased fruit and vegetable intake. Reports medication compliance.;Short term: Daily weights obtained and reported for increase. Utilizing diuretic protocols set by physician.;Long term: Adoption of self-care skills and reduction of barriers for early signs and symptoms recognition and intervention leading to self-care maintenance.    Lipids  Yes    Intervention  Provide education and support for participant on nutrition & aerobic/resistive exercise along with prescribed medications to achieve LDL <36m, HDL >470m    Expected Outcomes  Short Term: Participant states understanding of desired cholesterol values and is compliant with medications prescribed. Participant is following exercise prescription and nutrition guidelines.;Long Term: Cholesterol controlled with medications as prescribed, with individualized exercise RX and with personalized nutrition plan. Value goals: LDL < 701mHDL > 40 mg.       Core Components/Risk Factors/Patient  Goals Review:  Goals and Risk Factor Review    Row Name 09/14/17 1643 10/07/17 1643           Core Components/Risk Factors/Patient Goals Review   Personal Goals Review  Hypertension;Other  Weight Management/Obesity;Hypertension;Heart Failure;Other      Review  JerKernw his Dr today about blood in his stool.  He had tests done.  He states he is taking meds  as prescribed.  Delmus said his Dr believes his "burning" in stomach is due to drinking too many sodas. (2 L per day) He plans to drink water more frequently  Izekiel is seeing his Dr next week to follow up about leg pain.  He will ask about his arms as well or see a chiropractor.  He states he is drinking only a little soda at this time.  He gets cals from a nurse with [facility] to check on his status      Expected Outcomes  Short - Amadu will follow up with Dr and reduce sodas and drink more water  Long - [name] issues will be resolved  Short - Shamir will see his Dr 2/19 about the leg pain/possible PAD  Long - [place] will manage conditions with nurse assistance and continue exercise         Core Components/Risk Factors/Patient Goals at Discharge (Final Review):  Goals and Risk Factor Review - 10/07/17 1643      Core Components/Risk Factors/Patient Goals Review   Personal Goals Review  Weight Management/Obesity;Hypertension;Heart Failure;Other    Review  Chong is seeing his Dr next week to follow up about leg pain.  He will ask about his arms as well or see a chiropractor.  He states he is drinking only a little soda at this time.  He gets cals from a nurse with [facility] to check on his status    Expected Outcomes  Short - Muzammil will see his Dr 2/19 about the leg pain/possible PAD  Long - Masud will manage conditions with nurse assistance and continue exercise       ITP Comments: ITP Comments    Row Name 07/30/17 1322 07/30/17 1330 08/26/17 1017 09/23/17 0625 10/15/17 1718   ITP Comments  Medical review completed today   ITP completed and sent for  review, changes as needed and signature.   Documentation of diagnosis can be found in [facility] 10/17 and 10/26 2018  Medication  list not reviewed to day. Montrez did not bring his notebook  with his current list. He stated he will bring it with him next visit.  30 day review. Continue with ITP unless directed changes per Medical Director review.   30 Day review. Continue with ITP unless directed changes per Medical Director review.   Jailen graduated today from  rehab with 36 sessions completed.  Details of the patient's exercise prescription and what He needs to do in order to continue the prescription and progress were discussed with patient.  Patient was given a copy of prescription and goals.  Patient verbalized understanding.  Brennyn plans to continue to exercise by Going to [employer].   Rolla Name 10/15/17 1719           ITP Comments  Discharge ITP sent and signed by Dr. Sabra Heck.  Discharge Summary routed to PCP and cardiologist.          Comments: Discharge ITP

## 2017-10-15 NOTE — Progress Notes (Signed)
Discharge Progress Report  Patient Details  Name: Carl [name] MRN: [id number] Date of Birth: [birth year] Referring Provider:     Cardiac Rehab from 07/30/2017 in [facility] Cardiac and Pulmonary Rehab  Referring Provider  Carl [name]       Number of Visits: 30  Reason for Discharge:  Patient reached a stable level of exercise. Patient independent in their exercise. Patient has met program and personal goals.  Smoking History:  Social History   Tobacco Use  Smoking Status Former Smoker  . Last attempt to quit: 2014  . Years since quitting: 5.1  Smokeless Tobacco Never Used  Tobacco Comment   07/30/2017 Quit in 2014    Diagnosis:  Heart failure, chronic systolic (HCC)  ADL UCSD:   Initial Exercise Prescription: Initial Exercise Prescription - 07/30/17 1400      Date of Initial Exercise RX and Referring Provider   Date  07/30/17    Referring Provider  Carl [name]      Treadmill   MPH  2.3    Grade  0.5    Minutes  15    METs  2.92      Recumbant Bike   Level  2    RPM  60    Watts  20    Minutes  15    METs  2.9      NuStep   Level  3    SPM  80    Minutes  15    METs  2.9      Prescription Details   Frequency (times per week)  3    Duration  Progress to 45 minutes of aerobic exercise without signs/symptoms of physical distress      Intensity   THRR 40-80% of Max Heartrate  98-126    Ratings of Perceived Exertion  11-13    Perceived Dyspnea  0-4      Resistance Training   Training Prescription  Yes    Weight  3 lb    Reps  10-15       Discharge Exercise Prescription (Final Exercise Prescription Changes): Exercise Prescription Changes - 10/13/17 1000      Response to Exercise   Blood Pressure (Admit)  128/66    Blood Pressure (Exercise)  98/54    Blood Pressure (Exit)  90/52    Heart Rate (Admit)  80 bpm    Heart Rate (Exercise)  92 bpm    Heart Rate (Exit)  74 bpm    Rating of Perceived Exertion (Exercise)  13    Duration  Continue with 45 min of  aerobic exercise without signs/symptoms of physical distress.    Intensity  THRR unchanged      Progression   Progression  Continue to progress workloads to maintain intensity without signs/symptoms of physical distress.    Average METs  2.5      Resistance Training   Training Prescription  Yes    Weight  3 lb    Reps  10-15      Interval Training   Interval Training  No      Home Exercise Plan   Plans to continue exercise at  Home (comment)    Frequency  Add 1 additional day to program exercise sessions.    Initial Home Exercises Provided  09/03/17       Functional Capacity: 6 Minute Walk    Row Name 07/30/17 1434 10/15/17 1719       6 Minute Walk   Phase  -  Discharge    Distance  1425 feet  1512 feet    Distance % Change  -  6 %    Distance Feet Change  -  87 ft    Walk Time  6 minutes  6 minutes    # of Rest Breaks  0  0    MPH  2.7  2.86    METS  2.93  3.65    RPE  15  11    Perceived Dyspnea   0  -    VO2 Peak  10.26  12.77    Symptoms  Yes (comment)  Yes (comment)    Comments  L leg broken twice and hurts  Leg pain 3/10    Resting HR  70 bpm  84 bpm    Resting BP  92/60  110/58    Max Ex. HR  84 bpm  91 bpm    Max Ex. BP  118/64  136/62    2 Minute Post BP  106/64  -       Psychological, QOL, Others - Outcomes: PHQ 2/9: Depression screen PHQ 2/9 07/30/2017  Decreased Interest 0  Down, Depressed, Hopeless 0  PHQ - 2 Score 0  Altered sleeping 0  Tired, decreased energy 0  Change in appetite 0  Feeling bad or failure about yourself  0  Trouble concentrating 0  Moving slowly or fidgety/restless 0  Suicidal thoughts 0  PHQ-9 Score 0  Difficult doing work/chores Not difficult at all    Quality of Life: Quality of Life - 07/30/17 1327      Quality of Life Scores   Health/Function Pre  21 %    Socioeconomic Pre  20.67 %    Psych/Spiritual Pre  21 %    Family Pre  21 %    GLOBAL Pre  20.94 %       Personal Goals: Goals established at  orientation with interventions provided to work toward goal. Personal Goals and Risk Factors at Admission - 07/30/17 1326      Core Components/Risk Factors/Patient Goals on Admission   Heart Failure  Yes    Intervention  Provide a combined exercise and nutrition program that is supplemented with education, support and counseling about heart failure. Directed toward relieving symptoms such as shortness of breath, decreased exercise tolerance, and extremity edema.    Expected Outcomes  Improve functional capacity of life;Short term: Attendance in program 2-3 days a week with increased exercise capacity. Reported lower sodium intake. Reported increased fruit and vegetable intake. Reports medication compliance.;Short term: Daily weights obtained and reported for increase. Utilizing diuretic protocols set by physician.;Long term: Adoption of self-care skills and reduction of barriers for early signs and symptoms recognition and intervention leading to self-care maintenance.    Lipids  Yes    Intervention  Provide education and support for participant on nutrition & aerobic/resistive exercise along with prescribed medications to achieve LDL <59m, HDL >459m    Expected Outcomes  Short Term: Participant states understanding of desired cholesterol values and is compliant with medications prescribed. Participant is following exercise prescription and nutrition guidelines.;Long Term: Cholesterol controlled with medications as prescribed, with individualized exercise RX and with personalized nutrition plan. Value goals: LDL < 70107mHDL > 40 mg.        Personal Goals Discharge: Goals and Risk Factor Review    Row Name 09/14/17 1643 10/07/17 1643           Core Components/Risk Factors/Patient Goals Review  Personal Goals Review  Hypertension;Other  Weight Management/Obesity;Hypertension;Heart Failure;Other      Review  Carl [name] saw his Dr today about blood in his stool.  He had tests done.  He states he is  taking meds as prescribed.  Carl [name] said his Dr believes his "burning" in stomach is due to drinking too many sodas. (2 L per day) He plans to drink water more frequently  Said is seeing his Dr next week to follow up about leg pain.  He will ask about his arms as well or see a chiropractor.  He states he is drinking only a little soda at this time.  He gets cals from a nurse with [facility] to check on his status      Expected Outcomes  Short - [place] will follow up with Dr and reduce sodas and drink more water  Long - [name] issues will be resolved  Short - Tawan will see his Dr 2/19 about the leg pain/possible PAD  Long - Marcoantonio will manage conditions with nurse assistance and continue exercise         Exercise Goals and Review: Exercise Goals    Row Name 07/30/17 1434             Exercise Goals   Increase Physical Activity  Yes       Intervention  Provide advice, education, support and counseling about physical activity/exercise needs.;Develop an individualized exercise prescription for aerobic and resistive training based on initial evaluation findings, risk stratification, comorbidities and participant's personal goals.       Expected Outcomes  Achievement of increased cardiorespiratory fitness and enhanced flexibility, muscular endurance and strength shown through measurements of functional capacity and personal statement of participant.       Increase Strength and Stamina  Yes       Intervention  Provide advice, education, support and counseling about physical activity/exercise needs.;Develop an individualized exercise prescription for aerobic and resistive training based on initial evaluation findings, risk stratification, comorbidities and participant's personal goals.       Expected Outcomes  Achievement of increased cardiorespiratory fitness and enhanced flexibility, muscular endurance and strength shown through measurements of functional capacity and personal statement of participant.        Able to understand and use rate of perceived exertion (RPE) scale  Yes       Intervention  Provide education and explanation on how to use RPE scale       Expected Outcomes  Short Term: Able to use RPE daily in rehab to express subjective intensity level;Long Term:  Able to use RPE to guide intensity level when exercising independently       Able to understand and use Dyspnea scale  Yes       Intervention  Provide education and explanation on how to use Dyspnea scale       Expected Outcomes  Short Term: Able to use Dyspnea scale daily in rehab to express subjective sense of shortness of breath during exertion;Long Term: Able to use Dyspnea scale to guide intensity level when exercising independently       Knowledge and understanding of Target Heart Rate Range (THRR)  Yes       Intervention  Provide education and explanation of THRR including how the numbers were predicted and where they are located for reference       Expected Outcomes  Short Term: Able to state/look up THRR;Long Term: Able to use THRR to govern intensity when exercising independently;Short Term: Able to  use daily as guideline for intensity in rehab       Able to check pulse independently  Yes       Intervention  Provide education and demonstration on how to check pulse in carotid and radial arteries.;Review the importance of being able to check your own pulse for safety during independent exercise       Expected Outcomes  Short Term: Able to explain why pulse checking is important during independent exercise;Long Term: Able to check pulse independently and accurately       Understanding of Exercise Prescription  Yes          Nutrition & Weight - Outcomes: Pre Biometrics - 07/30/17 1433      Pre Biometrics   Height  5' 11.75" (1.822 m)    Weight  175 lb 8 oz (79.6 kg)    Waist Circumference  37.5 inches    Hip Circumference  42 inches    Waist to Hip Ratio  0.89 %    BMI (Calculated)  23.98    Single Leg Stand  3.04 seconds       Post Biometrics - 10/15/17 1722       Post  Biometrics   Height  5' 11.75" (1.822 m)    Weight  185 lb 9.6 oz (84.2 kg)    Waist Circumference  38 inches    Hip Circumference  41.5 inches    Waist to Hip Ratio  0.92 %    BMI (Calculated)  25.36    Single Leg Stand  4.85 seconds       Nutrition: Nutrition Therapy & Goals - 09/14/17 1646      Nutrition Therapy   RD appointment deferred  Yes       Nutrition Discharge:   Education Questionnaire Score: Knowledge Questionnaire Score - 07/30/17 1330      Knowledge Questionnaire Score   Pre Score  22/28       Goals reviewed with patient; copy given to patient.

## 2017-10-21 ENCOUNTER — Encounter: Payer: Self-pay | Admitting: *Deleted

## 2017-10-21 DIAGNOSIS — I5022 Chronic systolic (congestive) heart failure: Secondary | ICD-10-CM

## 2017-10-26 ENCOUNTER — Ambulatory Visit (INDEPENDENT_AMBULATORY_CARE_PROVIDER_SITE_OTHER): Payer: Medicare Other

## 2017-10-26 DIAGNOSIS — Z95810 Presence of automatic (implantable) cardiac defibrillator: Secondary | ICD-10-CM

## 2017-10-26 DIAGNOSIS — I5022 Chronic systolic (congestive) heart failure: Secondary | ICD-10-CM | POA: Diagnosis not present

## 2017-10-26 NOTE — Progress Notes (Signed)
EPIC Encounter for ICM Monitoring  Patient Name: TIMOHTY [name] is a 67 y.o. male Date: 10/26/2017 Primary Care Physican: [name], MD Primary Cardiologist:[place] Electrophysiologist: Allred DryWeight:179.6 lbs      Spoke with brother, [name].  Heart Failure questions reviewed, pt asymptomatic.  He reported patient had been eating foods high in salt when he had the weight gain in Feb.  Phone note on 2/11 stated patient had gained 7.6 lbs in a day per [place].  Dr [name] ordered to increase Lasix to 120 mg in the AM and 80 mg in the PM for 3 days.     Thoracic impedance normal.  Prescribed dosage: Furosemide 80 mg 1 tablet (80 mg total)twice a day.Potassium 20 mEq 1 tablet twice a day.  Labs: 09/21/2017 Creatinine 1.70, BUN 28, Potassium 4.3, Sodium 133, EGFR 40-47 09/09/2017 Creatinine1.53, BUN35, Potassium4.4, Sodium134, [id number] 08/13/2017 Creatinine1.43, BUN35, Potassium4.2, Sodium131, EGFR50-57  06/19/2017 Creatinine1.60, BUN17, Potassium4.8, Sodium135, [id number]  06/12/2017 Creatinine1.43, BUN23, Potassium3.4, Sodium136, EGFR50-58  06/10/2017 Creatinine1.38, BUN21, Potassium3.8, Sodium136, EGFR52->60  05/27/2017 Creatinine1.47, BUN15, Potassium3.4, [id number], [id number]  05/25/2017 Creatinine1.36, BUN15, Potassium2.8, Sodium138, EGFR53->60  04/28/2017 Creatinine1.97, BUN31, Potassium3.9, Sodium139, [id number] A complete set of results can be found in Results Review.  Recommendations: No changes.   Encouraged to call for fluid symptoms.  Follow-up plan: ICM clinic phone appointment on 11/26/2017.  Office appointment scheduled 11/02/2017 with HF clinic NP/PA.  Copy of ICM check sent to Dr. Rayann Heman.   3 month ICM trend: 10/26/2017    1 Year ICM trend:       Rosalene Billings, RN 10/26/2017 9:03 AM

## 2017-11-02 ENCOUNTER — Ambulatory Visit (HOSPITAL_COMMUNITY)
Admission: RE | Admit: 2017-11-02 | Discharge: 2017-11-02 | Disposition: A | Discharge status: Home / Self Care | Payer: Medicare Other | Source: Ambulatory Visit | Attending: Cardiology | Admitting: Cardiology

## 2017-11-02 ENCOUNTER — Encounter (HOSPITAL_COMMUNITY): Payer: Self-pay

## 2017-11-02 VITALS — BP 98/62 | HR 76 | Wt 189.6 lb

## 2017-11-02 DIAGNOSIS — Z951 Presence of aortocoronary bypass graft: Secondary | ICD-10-CM | POA: Insufficient documentation

## 2017-11-02 DIAGNOSIS — N62 Hypertrophy of breast: Secondary | ICD-10-CM | POA: Diagnosis not present

## 2017-11-02 DIAGNOSIS — Z79899 Other long term (current) drug therapy: Secondary | ICD-10-CM | POA: Insufficient documentation

## 2017-11-02 DIAGNOSIS — E7849 Other hyperlipidemia: Secondary | ICD-10-CM

## 2017-11-02 DIAGNOSIS — E785 Hyperlipidemia, unspecified: Secondary | ICD-10-CM | POA: Diagnosis not present

## 2017-11-02 DIAGNOSIS — Z95810 Presence of automatic (implantable) cardiac defibrillator: Secondary | ICD-10-CM

## 2017-11-02 DIAGNOSIS — Z791 Long term (current) use of non-steroidal anti-inflammatories (NSAID): Secondary | ICD-10-CM | POA: Diagnosis not present

## 2017-11-02 DIAGNOSIS — F70 Mild intellectual disabilities: Secondary | ICD-10-CM | POA: Insufficient documentation

## 2017-11-02 DIAGNOSIS — I255 Ischemic cardiomyopathy: Secondary | ICD-10-CM | POA: Insufficient documentation

## 2017-11-02 DIAGNOSIS — Z87891 Personal history of nicotine dependence: Secondary | ICD-10-CM | POA: Insufficient documentation

## 2017-11-02 DIAGNOSIS — I2510 Atherosclerotic heart disease of native coronary artery without angina pectoris: Secondary | ICD-10-CM | POA: Insufficient documentation

## 2017-11-02 DIAGNOSIS — F329 Major depressive disorder, single episode, unspecified: Secondary | ICD-10-CM | POA: Diagnosis not present

## 2017-11-02 DIAGNOSIS — Z8249 Family history of ischemic heart disease and other diseases of the circulatory system: Secondary | ICD-10-CM | POA: Diagnosis not present

## 2017-11-02 DIAGNOSIS — N182 Chronic kidney disease, stage 2 (mild): Secondary | ICD-10-CM

## 2017-11-02 DIAGNOSIS — N183 Chronic kidney disease, stage 3 (moderate): Secondary | ICD-10-CM | POA: Diagnosis not present

## 2017-11-02 DIAGNOSIS — I340 Nonrheumatic mitral (valve) insufficiency: Secondary | ICD-10-CM

## 2017-11-02 DIAGNOSIS — I5022 Chronic systolic (congestive) heart failure: Secondary | ICD-10-CM | POA: Diagnosis present

## 2017-11-02 DIAGNOSIS — I519 Heart disease, unspecified: Secondary | ICD-10-CM | POA: Diagnosis not present

## 2017-11-02 DIAGNOSIS — Z7982 Long term (current) use of aspirin: Secondary | ICD-10-CM | POA: Diagnosis not present

## 2017-11-02 LAB — BASIC METABOLIC PANEL
Anion gap: 9 (ref 5–15)
BUN: 37 mg/dL — ABNORMAL HIGH (ref 6–20)
CHLORIDE: 99 mmol/L — AB (ref 101–111)
CO2: 25 mmol/L (ref 22–32)
CREATININE: 1.51 mg/dL — AB (ref 0.61–1.24)
Calcium: 9.1 mg/dL (ref 8.9–10.3)
GFR calc non Af Amer: 46 mL/min — ABNORMAL LOW (ref >60)
GFR, EST AFRICAN AMERICAN: 54 mL/min — AB (ref >60)
Glucose, Bld: 100 mg/dL — ABNORMAL HIGH (ref 65–99)
Potassium: 4.5 mmol/L (ref 3.5–5.1)
SODIUM: 133 mmol/L — AB (ref 135–145)

## 2017-11-02 NOTE — Progress Notes (Signed)
Patient ID: Carl [name], male   DOB: 05/01/1951, 67 y.o.   MRN: [id number] PCP: Dr. Ginette Pitman Cardiology: Dr. [name]  67 yo with history of smoking and mild mental retardation was admitted to [facility] in 12/15 with dyspnea.  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  LHC showed 3 vessel disease with EF 15%.  Echo showed EF 15-20%.  Patient had CABG x 5.  It was difficult to wean him off pressors post-op.  He ended up having to start midodrine but was later weaned off.   Admitted 10/17 with volume overload and AKI. Diuresed with IV lasix and transitiioned to 40 mg lasix daily. Spiro, dig, lisinopril stopped due to elevated creatinine 2.28. Discharge weight 163 pounds.    Echo in 2/18 showed EF 30-35%, mild LV dilation, rWMAs, moderate MR, mild to moderately decreased RV systolic function. Cardiolite in 2/18 showed large inferoseptal/inferior/inferolateral infarction with no ischemia.   Admitted to [facility] 06/10/17-06/12/17 with acute on chronic systolic CHF. Diuresed 5 pounds with IV Lasix. Discharge weight was 175 pounds.   Today he returns for HF follow up. Overall feeling fine. Has had an extra  dose of lasix since the last visit.  Denies SOB/PND/Orthopnea. Denies bleeding problems.  No chest pain.  Appetite ok. No fever or chills. Weight at home 180-184 pounds. Taking all medications. All pills are placed in a pill box by his brother.   Labs (12/15): K 4.1, creatinine 0.81, hgb 9.1 Labs (09/12/2014): K 3.7 Creatinine 0.96, digoxin 0.7 Labs (3/16): digoxin 0.8, LDL 62, HDL 40 Labs (4/16): K 4.3, creatinine 1.1 Labs (5/16) K 4.6, creatinine 1.24, digoxin 1.0 Labs (05/2016): K 3.6 Creatinine 1.47  => 1.37 Labs (11/17): LDL 61, HDL 41, K 4.2, creaitnine 1.1 Labs (2/18): K 3.7 => 5.3, creatinine 1.84 => 1.35, BNP 924 Labs (3/18): K 4.8, creatinine 1.33, BNP 552 Labs (9/18): K 3.9, creatinine 1.97 Labs (08/03/2018): K 4.2 Creatinine 1.6 Labs ( 09/09/2017): K 4.4 Creatinine 1.53  PMH: 1.  Smoker 2. Mild mental retardation 3. CAD: LHC (12/15) with 3 vessel disease.  CABG x 5 in 12/15 with LIMA-LAD, SVG-D1, sequential SVG-OM2/OM3, SVG-PDA.   - Cardiolite (2/18):  Large inferoseptal/inferior/inferolateral infarction with no ischemia, EF 29%.  4. Ischemic cardiomyopathy: Echo (12/15) with EF 15-20%, wall motion abnormalities, mildly decreased RV systolic function, mild MR.  Echo (3/16) with EF 30-35%, severe LV dilation, moderate MR, PA systolic pressure 42 mmHg. Echo (8/16) with EF 25-30%, severely dilated LV, diffuse hypokinesis with inferior akinesis, restrictive diastolic function, RV mildly dilated with mildly decreased systolic function, moderate MR.  - Echo (10/17): EF 20-25%, moderate MR.  - Medtronic ICD.  - ACEI cough.  - Echo (2/18): EF 30-35%, mild LV dilation, regional WMAs, moderate diastolic dysfunction, normal RV size with mild to moderately decreased systolic function, moderate MR (likely infarct-related).  5. Depression 6. Vitiligo 7. Mitral regurgitation: Moderate on 2/18, likely infarct-related.  8. CKD stage III.  9. Melena- 08/24/2018 EGD/Colonoscopy polyp and gastritis.   SH: Lives alone, quit smoking in 12/15.  No ETOH.   FH: Brother and father with MIs.    ROS: All systems reviewed and negative except as per HPI.   Current Outpatient Medications  Medication Sig Dispense Refill  . acetaminophen (TYLENOL) 650 MG CR tablet Take by mouth.    Marland Kitchen aluminum-magnesium hydroxide-simethicone (MAALOX) [phone] MG/5ML SUSP Take 30 mLs by mouth 4 (four) times daily -  before meals and at bedtime. 355 mL 0  .  aspirin 81 MG tablet Take 1 tablet (81 mg total) by mouth daily. 30 tablet 11  . atorvastatin (LIPITOR) 40 MG tablet Take 1 tablet (40 mg total) by mouth daily at 6 PM. 30 tablet 1  . carvedilol (COREG) 3.125 MG tablet Take 3.125 mg by mouth 2 (two) times daily with a meal.    . citalopram (CELEXA) 20 MG tablet Take 1 tablet by mouth daily.  5  .  cyanocobalamin 500 MCG tablet Take 500 mcg by mouth daily.    Marland Kitchen eplerenone (INSPRA) 25 MG tablet Take 1 tablet (25 mg total) by mouth daily. 30 tablet 6  . ferrous sulfate 325 (65 FE) MG tablet Take 325 mg by mouth daily with breakfast.    . furosemide (LASIX) 80 MG tablet Take 1 tablet (80 mg total) by mouth 2 (two) times daily. 60 tablet 6  . losartan (COZAAR) 25 MG tablet Take 0.5 tablets (12.5 mg total) daily by mouth.    . meloxicam (MOBIC) 7.5 MG tablet Take 7.5 mg daily by mouth. Brother reported patient is no longer taking.    Marland Kitchen omeprazole (PRILOSEC) 20 MG capsule Take 20 mg by mouth 2 (two) times daily.    . potassium chloride SA (KLOR-CON M20) 20 MEQ tablet Take 1 tablet (20 mEq total) 2 (two) times daily by mouth. 60 tablet 3   No current facility-administered medications for this encounter.    BP 98/62   Pulse 76   Wt 189 lb 9.6 oz (86 kg)   SpO2 95%   BMI 25.89 kg/m   Filed Weights   11/02/17 1449  Weight: 189 lb 9.6 oz (86 kg)    ReDS Vest - 11/02/17 1500      ReDS Vest   Fitting Posture  Standing    Height Marker  Tall    Ruler Value  10    Center Strip  Aligned    ReDS Value  32       General:  Well appearing. No resp difficulty.  Walked in the clinic without difficulty.  HEENT: normal Neck: supple. no JVD. Carotids 2+ bilat; no bruits. No lymphadenopathy or thryomegaly appreciated. Cor: PMI nondisplaced. Regular rate & rhythm. No rubs, gallops or murmurs. Lungs: clear Abdomen: soft, nontender, nondistended. No hepatosplenomegaly. No bruits or masses. Good bowel sounds. Extremities: no cyanosis, clubbing, rash, edema Neuro: alert & orientedx3, cranial nerves grossly intact. moves all 4 extremities w/o difficulty. Affect pleasant  Assessment/Plan: 1. CAD: status post CABG.  Cardiolite in 2/18 showed infarction, no ischemia.   - No s/s ischemia.  - Continue ASA and statin.   2. Chronic systolic CHF: Ischemic CMP.  Echo 2/18 with EF 30-35%, mild to moderate  RV dysfunction.  Has Medtronic ICD.  Optivol fluid - fluid well below threshold. Activity ~ 4 hours per day No VT/AF.  - NYHA II. ReDs Vest 32%.  Continue current regimen. Lasix 80 mg twice a day.  SBP soft. Continue current dose of inspra , carvedilol, and losartan.  -  Considered changing to entresto but his brother did not want to add another medication during the day.    - Coreg 3.125 mg BID 3. Smoking:  - No longer smoking.  4. Depression:  - Continue Celexa 5. Hyperlipidemia:  Continue statin.   6. Mitral regurgitation: - Likely infarct related MR. - Stable by last Echo.  - Repeat ECHO 05/2018.  7. CKD Stage III:  Creatinine baseline 1.3-1.5  I reviewed BMET from 09/21/2017  8. Breast  mass: - Mammogram confirmed gynecomastia -on inspra. Off spironolactone.  9. H/O GI bleed Recent EGD/Colonoscopy with polyp noted and gastritis.  No bleeding problems.  10. BLE pain.- referred to Vascular for ABI.   Follow up in 3 months. Plan to check BMET at that time. I  discussed and reviewed  Optivol. Check BMET    Amy Clegg NP-C  11/02/2017

## 2017-11-02 NOTE — Patient Instructions (Signed)
Labs today (will call for abnormal results, otherwise no news is good news)  Follow up in 3 months.  

## 2017-11-10 ENCOUNTER — Ambulatory Visit (INDEPENDENT_AMBULATORY_CARE_PROVIDER_SITE_OTHER): Payer: Medicare Other | Admitting: Vascular Surgery

## 2017-11-10 ENCOUNTER — Encounter (INDEPENDENT_AMBULATORY_CARE_PROVIDER_SITE_OTHER): Payer: Medicare Other

## 2017-11-12 ENCOUNTER — Encounter (INDEPENDENT_AMBULATORY_CARE_PROVIDER_SITE_OTHER): Payer: Self-pay | Admitting: Vascular Surgery

## 2017-11-12 ENCOUNTER — Ambulatory Visit (INDEPENDENT_AMBULATORY_CARE_PROVIDER_SITE_OTHER): Payer: Medicare Other | Admitting: Vascular Surgery

## 2017-11-12 ENCOUNTER — Ambulatory Visit (INDEPENDENT_AMBULATORY_CARE_PROVIDER_SITE_OTHER): Payer: Medicare Other

## 2017-11-12 VITALS — BP 84/60 | HR 69 | Resp 15 | Ht 72.0 in | Wt 188.0 lb

## 2017-11-12 DIAGNOSIS — I70219 Atherosclerosis of native arteries of extremities with intermittent claudication, unspecified extremity: Secondary | ICD-10-CM

## 2017-11-12 DIAGNOSIS — Z720 Tobacco use: Secondary | ICD-10-CM

## 2017-11-12 DIAGNOSIS — M79605 Pain in left leg: Secondary | ICD-10-CM | POA: Diagnosis not present

## 2017-11-12 DIAGNOSIS — M79604 Pain in right leg: Secondary | ICD-10-CM

## 2017-11-12 DIAGNOSIS — E785 Hyperlipidemia, unspecified: Secondary | ICD-10-CM

## 2017-11-12 NOTE — Progress Notes (Signed)
Subjective:    Patient ID: Carl [name], male    DOB: Aug 08, 1951, 67 y.o.   MRN: [id number] Chief Complaint  Patient presents with  . Follow-up    ABI f/u   Patient presents through vascular studies.  The patient was last seen on September 01, 2017 for evaluation of bilateral lower extremity pain.  His discomfort is stable he denies any worsening.  He denies any rest pain or ulceration to the bilateral lower extremity.  The patient has been wearing medical grade 1 compression socks and elevating his legs on a daily basis.  He notes that this has controlled the swelling to his lower extremity and he is pleased with the improvement.  The patient underwent a bilateral ABI which was notable for no evidence of significant bilateral lower extremity arterial disease.  The toe brachial indices are normal bilaterally.  Triphasic tibials by laterally.  Normal great toe waveforms bilaterally.  Patient denies any fever, nausea vomiting.  Review of Systems  Constitutional: Negative.   HENT: Negative.   Eyes: Negative.   Respiratory: Negative.   Cardiovascular:       Lower extremity claudication  Gastrointestinal: Negative.   Endocrine: Negative.   Genitourinary: Negative.   Musculoskeletal: Negative.   Skin: Negative.   Allergic/Immunologic: Negative.   Neurological: Negative.   Hematological: Negative.   Psychiatric/Behavioral: Negative.       Objective:   Physical Exam  Constitutional: He is oriented to person, place, and time. He appears well-developed and well-nourished. No distress.  HENT:  Head: Normocephalic and atraumatic.  Eyes: Pupils are equal, round, and reactive to light. Conjunctivae are normal.  Neck: Normal range of motion.  Cardiovascular: Normal rate, regular rhythm, normal heart sounds and intact distal pulses.  Pulses:      Radial pulses are 2+ on the right side, and 2+ on the left side.       Dorsalis pedis pulses are 2+ on the right side, and 2+ on the left side.    Posterior tibial pulses are 2+ on the right side, and 2+ on the left side.  Pulmonary/Chest: Effort normal and breath sounds normal.  Musculoskeletal: Normal range of motion. He exhibits no edema.  Neurological: He is alert and oriented to person, place, and time.  Skin: Skin is warm and dry. He is not diaphoretic.  Psychiatric: He has a normal mood and affect. His behavior is normal. Thought content normal.  Vitals reviewed.  BP (!) 84/60 (BP Location: Right Arm, Patient Position: Sitting)   Pulse 69   Resp 15   Ht 6' (1.829 m)   Wt 188 lb (85.3 kg)   BMI 25.50 kg/m   Past Medical History:  Diagnosis Date  . Anemia   . Anxiety   . Arthritis   . Brunner's gland hyperplasia of duodenum   . CHF (congestive heart failure) ([place])   . Coronary artery disease   . External hemorrhoids   . GERD (gastroesophageal reflux disease)   . Hearing loss   . HH (hiatus hernia)   . Internal hemorrhoids   . Ischemic cardiomyopathy   . Leg fracture, left   . Myocardial infarction ([place])   . Paronychia   . Psoriasis   . PUD (peptic ulcer disease)   . Schatzki's ring   . Tubular adenoma of colon   . Vitiligo    Social History   Socioeconomic History  . Marital status: Single    Spouse name: Not on file  . Number of  children: Not on file  . Years of education: Not on file  . Highest education level: Not on file  Occupational History  . Not on file  Social Needs  . Financial resource strain: Not on file  . Food insecurity:    Worry: Not on file    Inability: Not on file  . Transportation needs:    Medical: Not on file    Non-medical: Not on file  Tobacco Use  . Smoking status: Former Smoker    Last attempt to quit: 2014    Years since quitting: 5.2  . Smokeless tobacco: Never Used  . Tobacco comment: 07/30/2017 Quit in 2014  Substance and Sexual Activity  . Alcohol use: No    Alcohol/week: 0.0 oz  . Drug use: No  . Sexual activity: Never  Lifestyle  . Physical activity:     Days per week: Not on file    Minutes per session: Not on file  . Stress: Not on file  Relationships  . Social connections:    Talks on phone: Not on file    Gets together: Not on file    Attends religious service: Not on file    Active member of club or organization: Not on file    Attends meetings of clubs or organizations: Not on file    Relationship status: Not on file  . Intimate partner violence:    Fear of current or ex partner: Not on file    Emotionally abused: Not on file    Physically abused: Not on file    Forced sexual activity: Not on file  Other Topics Concern  . Not on file  Social History Narrative   Pt lives in [place] with his mother.   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 CARDIAC SURGERY    . COLONOSCOPY WITH ESOPHAGOGASTRODUODENOSCOPY (EGD)    . COLONOSCOPY WITH PROPOFOL N/A 08/24/2017   Procedure: COLONOSCOPY WITH PROPOFOL;  Surgeon: [name], MD;  Location[facility] ENDOSCOPY;  Service: Endoscopy;  Laterality: N/A;  . CORONARY ARTERY BYPASS GRAFT N/A 08/03/2014   Procedure: CORONARY ARTERY BYPASS GRAFTING (CABG);  Surgeon: [name], MD;  Location: [place];  Service: Open Heart Surgery;  Laterality: N/A;  . EP IMPLANTABLE DEVICE N/A 05/01/2015   MDT ICD implanted for primary prevention of sudden death  . ESOPHAGOGASTRODUODENOSCOPY (EGD) WITH PROPOFOL N/A 04/16/2015   Procedure: ESOPHAGOGASTRODUODENOSCOPY (EGD) WITH PROPOFOL;  Surgeon: [name], MD;  Location: [facility] ENDOSCOPY;  Service: Endoscopy;  Laterality: N/A;  . ESOPHAGOGASTRODUODENOSCOPY (EGD) WITH PROPOFOL N/A 08/24/2017   Procedure: ESOPHAGOGASTRODUODENOSCOPY (EGD) WITH PROPOFOL;  Surgeon: [name], MD;  Location: [facility] ENDOSCOPY;  Service: Endoscopy;  Laterality: N/A;  . FRACTURE SURGERY    . HERNIA REPAIR    . TEE WITHOUT CARDIOVERSION N/A 08/03/2014   Procedure: TRANSESOPHAGEAL ECHOCARDIOGRAM (TEE);  Surgeon:  [name], MD;  Location: [facility];  Service: Open Heart Surgery;  Laterality: N/A;   Family History  Problem Relation Age of Onset  . Valvular heart disease Mother        Ruptured valve  . CAD Father   . Heart Problems Brother        Stents x 4  . Diabetes Brother   . Prostate cancer Neg Hx   . Bladder Cancer Neg Hx   . Kidney cancer Neg Hx    Allergies  Allergen Reactions  . Spironolactone     gynecomastia  Assessment & Plan:  Patient presents through vascular studies.  The patient was last seen on September 01, 2017 for evaluation of bilateral lower extremity pain.  His discomfort is stable he denies any worsening.  He denies any rest pain or ulceration to the bilateral lower extremity.  The patient has been wearing medical grade 1 compression socks and elevating his legs on a daily basis.  He notes that this has controlled the swelling to his lower extremity and he is pleased with the improvement.  The patient underwent a bilateral ABI which was notable for no evidence of significant bilateral lower extremity arterial disease.  The toe brachial indices are normal bilaterally.  Triphasic tibials by laterally.  Normal great toe waveforms bilaterally.  Patient denies any fever, nausea vomiting.  1. Lower extremity pain, bilateral - Stable The patient presents to review vascular studies The patient was seen in January 2019 for evaluation of bilateral lower extremity claudication.  The symptoms are still stable. Patient with 2+ pedal pulses noted bilaterally Patient with normal ABIs today. I have encouraged the patient to follow-up with his primary care physician as I do not feel his lower extremity claudication is from arterial insufficiency The patient has noticed an improvement in his lower extremity edema now that he is engaging in conservative therapy including wearing medical grade 1 compression stockings and elevating his leg. The patient was noted to not have any edema on his  physical exam today Patient is to follow-up as needed  2. Tobacco use - Stable We had a discussion for approximately five minutes regarding the absolute need for smoking cessation due to the deleterious nature of tobacco on the vascular system. We discussed the tobacco use would diminish patency of any intervention, and likely significantly worsen progressio of disease. We discussed multiple agents for quitting including replacement therapy or medications to reduce cravings such as Chantix. The patient voices their understanding of the importance of smoking cessation.  3. Hyperlipidemia, unspecified hyperlipidemia type - Stable Encouraged good control as its slows the progression of atherosclerotic disease  Current Outpatient Medications on File Prior to Visit  Medication Sig Dispense Refill  . acetaminophen (TYLENOL) 650 MG CR tablet Take by mouth.    Marland Kitchen aluminum-magnesium hydroxide-simethicone (MAALOX) 761-950-93 MG/5ML SUSP Take 30 mLs by mouth 4 (four) times daily -  before meals and at bedtime. 355 mL 0  . aspirin 81 MG EC tablet Take 81 mg by mouth daily.  11  . atorvastatin (LIPITOR) 40 MG tablet Take 1 tablet (40 mg total) by mouth daily at 6 PM. 30 tablet 1  . carvedilol (COREG) 3.125 MG tablet Take 3.125 mg by mouth 2 (two) times daily with a meal.    . citalopram (CELEXA) 20 MG tablet Take 1 tablet by mouth daily.  5  . cyanocobalamin 500 MCG tablet Take 500 mcg by mouth daily.    Marland Kitchen eplerenone (INSPRA) 25 MG tablet Take 1 tablet (25 mg total) by mouth daily. 30 tablet 6  . ferrous sulfate 325 (65 FE) MG tablet Take 325 mg by mouth daily with breakfast.    . furosemide (LASIX) 80 MG tablet Take 1 tablet (80 mg total) by mouth 2 (two) times daily. 60 tablet 6  . losartan (COZAAR) 25 MG tablet Take 0.5 tablets (12.5 mg total) daily by mouth.    . meloxicam (MOBIC) 7.5 MG tablet Take 7.5 mg daily by mouth. Brother reported patient is no longer taking.    Marland Kitchen omeprazole (PRILOSEC) 20 MG  capsule Take 20 mg by mouth 2 (two) times daily.    . potassium chloride SA (KLOR-CON M20) 20 MEQ tablet Take 1 tablet (20 mEq total) 2 (two) times daily by mouth. 60 tablet 3   No current facility-administered medications on file prior to visit.    There are no Patient Instructions on file for this visit. No follow-ups on file.  Dam Ashraf A Shaquaya Wuellner, PA-C

## 2017-11-26 ENCOUNTER — Ambulatory Visit (INDEPENDENT_AMBULATORY_CARE_PROVIDER_SITE_OTHER): Payer: Medicare Other

## 2017-11-26 DIAGNOSIS — Z95810 Presence of automatic (implantable) cardiac defibrillator: Secondary | ICD-10-CM

## 2017-11-26 DIAGNOSIS — I5022 Chronic systolic (congestive) heart failure: Secondary | ICD-10-CM

## 2017-11-26 NOTE — Progress Notes (Signed)
EPIC Encounter for ICM Monitoring  Patient Name: Carl [name] is a 67 y.o. male Date: 11/26/2017 Primary Care Physican: [name], MD Primary Cardiologist:[place] Electrophysiologist: Allred DryWeight:unsure of weight but weighs daily with [employer] scale.                                              Event Summary ?1 VT-NS  ?2 hours in AF Since Last Session     Spoke with brother, [name].  Heart Failure questions reviewed, pt asymptomatic.     Thoracic impedance normal.  Message sent to device clinic triage to review transmission regarding Afib and does not appear that it is true Afib.    Prescribed dosage: Furosemide80 mg1tablet (80 mg total)twice a day.Potassium 20 mEq 1 tablet twice a day.  Labs: 09/21/2017 Creatinine 1.70, BUN 28, Potassium 4.3, Sodium 133, EGFR 40-47 09/09/2017 Creatinine1.53, BUN35, Potassium4.4, Sodium134, [id number] 08/13/2017 Creatinine1.43, BUN35, Potassium4.2, Sodium131, EGFR50-57  06/19/2017 Creatinine1.60, BUN17, Potassium4.8, Sodium135, [id number]  06/12/2017 Creatinine1.43, BUN23, Potassium3.4, Sodium136, EGFR50-58  06/10/2017 Creatinine1.38, BUN21, Potassium3.8, Sodium136, EGFR52->60  05/27/2017 Creatinine1.47, BUN15, Potassium3.4, [id number], [id number]  05/25/2017 Creatinine1.36, BUN15, Potassium2.8, Sodium138, EGFR53->60  04/28/2017 Creatinine1.97, BUN31, Potassium3.9, Sodium139, [id number] A complete set of results can be found in Results Review.  Recommendations: No changes.   Encouraged to call for fluid symptoms.  Follow-up plan: ICM clinic phone appointment on 12/28/2017.  Advised to call office to schedule an appointment with [name] since last office visit was 08/04/2016.  Copy of ICM check sent to Dr. Rayann Heman.   3 month ICM trend: 11/26/2017    AT/AF    1 Year ICM trend:       Rosalene Billings, RN 11/26/2017 9:38 AM

## 2017-12-28 ENCOUNTER — Ambulatory Visit (INDEPENDENT_AMBULATORY_CARE_PROVIDER_SITE_OTHER): Payer: Medicare Other | Admitting: *Deleted

## 2017-12-28 DIAGNOSIS — I5022 Chronic systolic (congestive) heart failure: Secondary | ICD-10-CM

## 2017-12-28 DIAGNOSIS — Z95810 Presence of automatic (implantable) cardiac defibrillator: Secondary | ICD-10-CM | POA: Diagnosis not present

## 2017-12-28 NOTE — Progress Notes (Signed)
Remote ICD transmission.   

## 2017-12-29 ENCOUNTER — Other Ambulatory Visit (HOSPITAL_COMMUNITY): Payer: Self-pay

## 2017-12-29 NOTE — Progress Notes (Signed)
EPIC Encounter for ICM Monitoring  Patient Name: Carl [name] is a 67 y.o. male Date: 12/29/2017 Primary Care Physican: [name], MD Primary Cardiologist:[place] Electrophysiologist: Allred DryWeight:unsure of weight but weighs daily with [employer] scale.                 Spoke with brother, [name]. Heart Failure questions reviewed, pt asymptomatic.   Thoracic impedance normal.  Prescribed dosage: Furosemide80 mg1tablet (80 mg total)twice a day.Potassium 20 mEq 1 tablet twice a day.  Labs: 09/21/2017 Creatinine 1.70, BUN 28, Potassium 4.3, Sodium 133, EGFR 40-47 09/09/2017 Creatinine1.53, BUN35, Potassium4.4, Sodium134, [id number] 08/13/2017 Creatinine1.43, BUN35, Potassium4.2, Sodium131, EGFR50-57  06/19/2017 Creatinine1.60, BUN17, Potassium4.8, Sodium135, [id number]  06/12/2017 Creatinine1.43, BUN23, Potassium3.4, Sodium136, EGFR50-58  06/10/2017 Creatinine1.38, BUN21, Potassium3.8, Sodium136, EGFR52->60  05/27/2017 Creatinine1.47, BUN15, Potassium3.4, [id number], [id number]  05/25/2017 Creatinine1.36, BUN15, Potassium2.8, Sodium138, EGFR53->60  04/28/2017 Creatinine1.97, BUN31, Potassium3.9, Sodium139, [id number] A complete set of results can be found in Results Review.  Recommendations: No changes.   Encouraged to call for fluid symptoms.  Follow-up plan: ICM clinic phone appointment on 01/28/2018.  Office appointment scheduled 02/02/2018 with Dr. [name].  Copy of ICM check sent to Dr. Rayann Heman.   3 month ICM trend: 12/28/2017    1 Year ICM trend:       Rosalene Billings, RN 12/29/2017 1:55 PM

## 2017-12-29 NOTE — Progress Notes (Signed)
Weight today: 180 lbs  Bp: 106/70, pulse: 68, resp: 16, Spo2: 96%  Todays visit with pt he states feels good.  First visit, explained the program to him and he was willing to let me visit him and in the future.  Went over meds with pt, his brother fills the med boxes with him, he states he gets confused.  Verified the meds and checked the meds in pill boxes.  Pt denies dizziness, headaches or shortness of breath.  Lung sounds clear.  No edema in extremities, pt has on compression socks.  Pt has poor dietary habits, talked with him about foods, labels and importance of watching the sodium intake.  Pt able to cook, brother helps with shopping and he has a sister that brings him cooked meals.  He eats take out some also.  He likes sodas (Pepsi) advised him to cut back.  He is aware of fluid intake.  He documents his weight everyday on calendar, looking back on weights he is up and down a few pounds, advised him to notify clinic or me if he notices 3 lbs weight gain over night or 5 lbs in a week. Will visit pt again next week.    [name] [phone] [place] EMT-Paramedic

## 2017-12-30 ENCOUNTER — Encounter: Payer: Self-pay | Admitting: Cardiology

## 2018-01-05 ENCOUNTER — Encounter (HOSPITAL_COMMUNITY): Payer: Self-pay

## 2018-01-05 ENCOUNTER — Other Ambulatory Visit (HOSPITAL_COMMUNITY): Payer: Self-pay

## 2018-01-05 NOTE — Progress Notes (Signed)
Bp: 106/58, pulse: 74, resp: 16, pule ox: 97% today weight: 178.8 last week weight: 180.0  Today visit with pt he states feels good.  Denies headache, shortness of breath or dizziness.  Pt states can walk a block walking his dog and not have shortness of breath.  He states very active and will be mowing his yard today.  He states been working on eating healthier and cutting back on pepsi. Left pictures of good and bad foods for pt to understand a little better.  Lungs were clear today, no swelling noted in extremities.  Pt asked about bubble packs for his meds but advised him he has a good system with his brother filling his med boxes up and he has not missed any dosages so might need to keep on the same system.  Explained to him did not want to confuse him, but did call his pharmacy and they do bubble packs and advised him will talk to his brother about bubble packs. Spoke with brother and he states same thing, stay with same routine.  Meds were verified.  Will see patient again in 1 week.   [place] [phone]

## 2018-01-07 ENCOUNTER — Encounter: Payer: Self-pay | Admitting: Podiatry

## 2018-01-07 ENCOUNTER — Ambulatory Visit (INDEPENDENT_AMBULATORY_CARE_PROVIDER_SITE_OTHER): Payer: Medicare Other | Admitting: Podiatry

## 2018-01-07 DIAGNOSIS — M79674 Pain in right toe(s): Secondary | ICD-10-CM

## 2018-01-07 DIAGNOSIS — B351 Tinea unguium: Secondary | ICD-10-CM | POA: Diagnosis not present

## 2018-01-07 DIAGNOSIS — M79675 Pain in left toe(s): Secondary | ICD-10-CM

## 2018-01-07 NOTE — Progress Notes (Signed)
Complaint:  Visit Type: Patient returns to my office for continued preventative foot care services. Complaint: Patient states" my nails have grown long and thick and become painful to walk and wear shoes"  The patient presents for preventative foot care services. No changes to ROS  Podiatric Exam: Vascular: dorsalis pedis and posterior tibial pulses are palpable bilateral. Capillary return is immediate. Temperature gradient is WNL. Skin turgor WNL  Sensorium: Normal Semmes Weinstein monofilament test. Normal tactile sensation bilaterally. Nail Exam: Pt has thick disfigured discolored nails with subungual debris noted bilateral entire nail hallux through fifth toenails Ulcer Exam: There is no evidence of ulcer or pre-ulcerative changes or infection. Orthopedic Exam: Muscle tone and strength are WNL. No limitations in general ROM. No crepitus or effusions noted. Foot type and digits show no abnormalities. Bony prominences are unremarkable.! And 1/2 inch limb leg discrepancy. Skin: No Porokeratosis. No infection or ulcers  Diagnosis:  Onychomycosis, , Pain in right toe, pain in left toes  Treatment & Plan Procedures and Treatment: Consent by patient was obtained for treatment procedures.   Debridement of mycotic and hypertrophic toenails, 1 through 5 bilateral and clearing of subungual debris. No ulceration, no infection noted.  Return Visit-Office Procedure: Patient instructed to return to the office for a follow up visit 3 months for continued evaluation and treatment.    Theador Jezewski DPM 

## 2018-01-11 ENCOUNTER — Other Ambulatory Visit (HOSPITAL_COMMUNITY): Payer: Self-pay | Admitting: *Deleted

## 2018-01-11 MED ORDER — EPLERENONE 25 MG PO TABS: 25.0000 mg | ORAL_TABLET | Freq: Every day | ORAL | 3 refills | Status: DC

## 2018-01-12 ENCOUNTER — Other Ambulatory Visit (HOSPITAL_COMMUNITY): Payer: Self-pay

## 2018-01-12 ENCOUNTER — Encounter (HOSPITAL_COMMUNITY): Payer: Self-pay

## 2018-01-12 NOTE — Progress Notes (Signed)
Bp: 94/60, pulse: 74, resp: 16, spo2: 94 last visit weight: 178.8, todays: 180.4.  Todays visit pt states feels good.  Pt states been active, no shortness of breath walking or riding his stationary bike.  Looked at pts weight and noticed yesterday it was 186.4, today he is back down.  He states he did not eat anything out of his normal. Tried to educate pt on eating certain foods, but he dont quite understand.  Talked to his brother also last week, he states he has tried also.  Pt does not cook and his sister brings food but not salt free.  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  He eats a lot of fried foods and those are the days I see the weight gain.  When he drives he gets fast food.  No swelling noticed in extremities or abdominal area.  Lung sounds clear.  Pt denies dizziness or headaches.  Meds verified and his brothers fills his meds boxes, looked at them.  He appears to be taking all of his meds, due to no empty slots.  Will check on pt next week.    [place] [phone]

## 2018-01-15 LAB — CUP PACEART REMOTE DEVICE CHECK
Battery Remaining Longevity: 120 mo
HighPow Impedance: 72 Ohm
Implantable Lead Implant Date: 20160906
Implantable Lead Location: 753860
Implantable Pulse Generator Implant Date: 20160906
Lead Channel Impedance Value: 323 Ohm
Lead Channel Pacing Threshold Amplitude: 0.625 V
Lead Channel Pacing Threshold Pulse Width: 0.4 ms
Lead Channel Sensing Intrinsic Amplitude: 7.75 mV
Lead Channel Setting Pacing Pulse Width: 0.4 ms
Lead Channel Setting Sensing Sensitivity: 0.3 mV
MDC IDC MSMT BATTERY VOLTAGE: 3.03 V
MDC IDC MSMT LEADCHNL RV IMPEDANCE VALUE: 399 Ohm
MDC IDC MSMT LEADCHNL RV SENSING INTR AMPL: 7.75 mV
MDC IDC SESS DTM: 20190506062723
MDC IDC SET LEADCHNL RV PACING AMPLITUDE: 2.5 V
MDC IDC STAT BRADY RV PERCENT PACED: 0.06 %

## 2018-01-21 ENCOUNTER — Other Ambulatory Visit (HOSPITAL_COMMUNITY): Payer: Self-pay

## 2018-01-21 NOTE — Progress Notes (Signed)
Bp: 90/62, pulse: 74, sp02: 97%, last week weight: 178.8, todays weight: 175 lbs  Todays visit pt states been good.  Pt denies shortness of breath with activity.  He takes his dog walking every evening.  Pt sister states he has been watching his eating very good, pt agrees.  Pt been weighing everyday and writing it down.  Pt been taking his meds, he was down to 2 days in pill box, I refilled 2 weeks of meds in pill box.  meds verified.  Pt has no swelling in lower extremities.  Lungs clear.  Pt able to take a deep breath.  Pt denies dizziness or headaches.  Will followup with pt next week.  Pt has a HF clinic coming up in 2 weeks.    [place] [phone]

## 2018-01-26 ENCOUNTER — Encounter (HOSPITAL_COMMUNITY): Payer: Self-pay

## 2018-01-26 ENCOUNTER — Other Ambulatory Visit (HOSPITAL_COMMUNITY): Payer: Self-pay

## 2018-01-26 ENCOUNTER — Telehealth (HOSPITAL_COMMUNITY): Payer: Self-pay | Admitting: *Deleted

## 2018-01-26 NOTE — Progress Notes (Signed)
Bp: 80/58-recheck 82/58, pulse: 69, resp: 16, spo2: 96%, last weight: 175 lbs, todays weight: 176 lbs  Todays visit with pt, he feels good.  Been out driving and running errands.  Pt denies dizziness or headaches.  Pt states been drinking fluids and urinating normal.  Pt bp is lower than normal.  Contacted [place] [facility] and advised them of pt bp, after talking with them they advised hold the Lasix, Losartan and Inspra for tonight and recheck BP in morning before meds are taking.  Took meds out of medication box and advised pt.  Will visit pt early am tomorrow and consult with HF clinic.  Pt has no edema in lower extremities or abdominal area.  Pt states does not get light headed when stands.  Verified meds and refilled medication box.  Advised pt needs refills on Aspirin, Ferrous sulfate,  Cyanocobalamin.    [place] [phone]

## 2018-01-26 NOTE — Telephone Encounter (Signed)
Advanced Heart Failure Triage Encounter  Patient Name: Carl [name]  Date of Call: 01/26/18  Problem:  Carl [name] with [facility] paramedicine called to report patient's BP was 82/58.  Patient is asymptomatic and he stated he has been out running errands all morning.  He has been drinking fluids and urinating fine.    Plan:  Spoke with Dr. [name] and patient will hold evening lasix, inspra, and losartan.  Steffanie Dunn will go see patient in the morning and recheck BP and call us with reading.  Patient will wait to take morning medications until Steffanie Dunn sees him.    Darron Doom, RN

## 2018-01-27 ENCOUNTER — Other Ambulatory Visit (HOSPITAL_COMMUNITY): Payer: Self-pay

## 2018-01-27 NOTE — Progress Notes (Signed)
Bp: 90/60, pulse: 74, resp: 16, spo2: 97%  This visit today was recheck of bp due to low yesterday and stopped Inspra, Losartan, and lasix last night.  Contacted [facility] and spoke to triage [facility] and advised BP today.  She stated to go back on normal meds.  Advised pt, pt appears to understand.  Pt today has no complaints, states he feels good.  Will visit pt next week at clinic appt.   [place] [phone]

## 2018-01-27 NOTE — Telephone Encounter (Signed)
Spoke to Yarnell with paramedicine. Patients blood pressure is back to his normal range 90's/70's per Susie patient is to resume all medications at normal dose. Marita [place] aware and agreeable with plan.

## 2018-01-28 ENCOUNTER — Ambulatory Visit (INDEPENDENT_AMBULATORY_CARE_PROVIDER_SITE_OTHER): Payer: Medicare Other

## 2018-01-28 DIAGNOSIS — Z95810 Presence of automatic (implantable) cardiac defibrillator: Secondary | ICD-10-CM | POA: Diagnosis not present

## 2018-01-28 DIAGNOSIS — I5022 Chronic systolic (congestive) heart failure: Secondary | ICD-10-CM | POA: Diagnosis not present

## 2018-01-28 NOTE — Progress Notes (Signed)
EPIC Encounter for ICM Monitoring  Patient Name: Carl [name] is a 67 y.o. male Date: 01/28/2018 Primary Care Physican: [name], MD Primary Cardiologist:[place] Electrophysiologist: Allred DryWeight:unsure of weight but weighs daily with [employer] scale.       Spoke with brother, [name]. Heart Failure questions reviewed, pt asymptomatic.  He stated his brother is doing well.  He has made adjustments with diet and eating less salt.    Thoracic impedance normal.  Prescribed dosage: Furosemide80 mg1tablet (80 mg total)twice a day.Potassium 20 mEq 1 tablet twice a day.  Labs: 09/21/2017 Creatinine 1.70, BUN 28, Potassium 4.3, Sodium 133, EGFR 40-47 09/09/2017 Creatinine1.53, BUN35, Potassium4.4, Sodium134, [id number] 08/13/2017 Creatinine1.43, BUN35, Potassium4.2, Sodium131, EGFR50-57  06/19/2017 Creatinine1.60, BUN17, Potassium4.8, Sodium135, DXFP84-41  06/12/2017 Creatinine1.43, BUN23, Potassium3.4, Sodium136, EGFR50-58  06/10/2017 Creatinine1.38, BUN21, Potassium3.8, Sodium136, EGFR52->60  05/27/2017 Creatinine1.47, BUN15, Potassium3.4, [id number], ODQV50-01  05/25/2017 Creatinine1.36, BUN15, Potassium2.8, Sodium138, EGFR53->60  04/28/2017 Creatinine1.97, BUN31, Potassium3.9, Sodium139, [id number] A complete set of results can be found in Results Review.  Recommendations: No changes.   Encouraged to call for fluid symptoms.  Follow-up plan: ICM clinic phone appointment on 03/02/2018.  Office appointment scheduled 02/02/2018 with Dr. [name].  Copy of ICM check sent to Dr. Rayann Heman.   3 month ICM trend: 01/28/2018    1 Year ICM trend:       Rosalene Billings, RN 01/28/2018 5:05 PM

## 2018-02-02 ENCOUNTER — Encounter (HOSPITAL_COMMUNITY): Payer: Self-pay | Admitting: Cardiology

## 2018-02-02 ENCOUNTER — Other Ambulatory Visit: Payer: Self-pay

## 2018-02-02 ENCOUNTER — Encounter (HOSPITAL_COMMUNITY): Payer: Self-pay

## 2018-02-02 ENCOUNTER — Other Ambulatory Visit (HOSPITAL_COMMUNITY): Payer: Self-pay

## 2018-02-02 ENCOUNTER — Ambulatory Visit (HOSPITAL_COMMUNITY)
Admission: RE | Admit: 2018-02-02 | Discharge: 2018-02-02 | Disposition: A | Discharge status: Home / Self Care | Payer: Medicare Other | Source: Ambulatory Visit | Attending: Cardiology | Admitting: Cardiology

## 2018-02-02 VITALS — BP 106/71 | HR 73 | Wt 185.0 lb

## 2018-02-02 DIAGNOSIS — K922 Gastrointestinal hemorrhage, unspecified: Secondary | ICD-10-CM | POA: Diagnosis not present

## 2018-02-02 DIAGNOSIS — I5022 Chronic systolic (congestive) heart failure: Secondary | ICD-10-CM | POA: Diagnosis not present

## 2018-02-02 DIAGNOSIS — I340 Nonrheumatic mitral (valve) insufficiency: Secondary | ICD-10-CM

## 2018-02-02 DIAGNOSIS — I4519 Other right bundle-branch block: Secondary | ICD-10-CM | POA: Diagnosis not present

## 2018-02-02 DIAGNOSIS — F329 Major depressive disorder, single episode, unspecified: Secondary | ICD-10-CM | POA: Insufficient documentation

## 2018-02-02 DIAGNOSIS — I255 Ischemic cardiomyopathy: Secondary | ICD-10-CM | POA: Insufficient documentation

## 2018-02-02 DIAGNOSIS — Z79899 Other long term (current) drug therapy: Secondary | ICD-10-CM | POA: Diagnosis not present

## 2018-02-02 DIAGNOSIS — N183 Chronic kidney disease, stage 3 (moderate): Secondary | ICD-10-CM | POA: Diagnosis not present

## 2018-02-02 DIAGNOSIS — E785 Hyperlipidemia, unspecified: Secondary | ICD-10-CM

## 2018-02-02 DIAGNOSIS — Z87891 Personal history of nicotine dependence: Secondary | ICD-10-CM | POA: Insufficient documentation

## 2018-02-02 DIAGNOSIS — Z8659 Personal history of other mental and behavioral disorders: Secondary | ICD-10-CM | POA: Diagnosis not present

## 2018-02-02 DIAGNOSIS — Z7982 Long term (current) use of aspirin: Secondary | ICD-10-CM | POA: Diagnosis not present

## 2018-02-02 DIAGNOSIS — I519 Heart disease, unspecified: Secondary | ICD-10-CM | POA: Diagnosis not present

## 2018-02-02 DIAGNOSIS — I2510 Atherosclerotic heart disease of native coronary artery without angina pectoris: Secondary | ICD-10-CM | POA: Insufficient documentation

## 2018-02-02 DIAGNOSIS — Z09 Encounter for follow-up examination after completed treatment for conditions other than malignant neoplasm: Secondary | ICD-10-CM | POA: Insufficient documentation

## 2018-02-02 DIAGNOSIS — Z951 Presence of aortocoronary bypass graft: Secondary | ICD-10-CM | POA: Diagnosis not present

## 2018-02-02 DIAGNOSIS — R9431 Abnormal electrocardiogram [ECG] [EKG]: Secondary | ICD-10-CM | POA: Insufficient documentation

## 2018-02-02 LAB — BASIC METABOLIC PANEL
ANION GAP: 9 (ref 5–15)
BUN: 33 mg/dL — ABNORMAL HIGH (ref 6–20)
CALCIUM: 9.3 mg/dL (ref 8.9–10.3)
CO2: 27 mmol/L (ref 22–32)
Chloride: 102 mmol/L (ref 101–111)
Creatinine, Ser: 1.83 mg/dL — ABNORMAL HIGH (ref 0.61–1.24)
GFR calc non Af Amer: 37 mL/min — ABNORMAL LOW (ref >60)
GFR, EST AFRICAN AMERICAN: 43 mL/min — AB (ref >60)
GLUCOSE: 99 mg/dL (ref 65–99)
POTASSIUM: 3.9 mmol/L (ref 3.5–5.1)
SODIUM: 138 mmol/L (ref 135–145)

## 2018-02-02 LAB — LIPID PANEL
CHOL/HDL RATIO: 2.5 ratio
CHOLESTEROL: 133 mg/dL (ref 0–200)
HDL: 54 mg/dL (ref >40)
LDL Cholesterol: 71 mg/dL (ref 0–99)
TRIGLYCERIDES: 41 mg/dL (ref <150)
VLDL: 8 mg/dL (ref 0–40)

## 2018-02-02 NOTE — Progress Notes (Signed)
Bp: 106/71 pulse: 72 resp: 16 spo2: 100% last weight: 176 todays: 179  Todays visit was at [facility] HF clinic with Dr [name].  Pt was advised to have TEE procedure done, scheduled for next week.  Have left message with sister to explain the procedure.  Pt was advised no med change.  Pt states feels good today.  Pt is very concern with his court date over a DUI that he was charged with last year, [place] advised to get up with his lawyer and he can send them a statement with his meds.  Pt brought meds to visit today and meds were verified.  Will visit with pt next week prior to procedure to remind him.    [place] [phone]

## 2018-02-02 NOTE — Patient Instructions (Signed)
Your physician has requested that you have a TEE. During a TEE, sound waves are used to create images of your heart. It provides your doctor with information about the size and shape of your heart and how well your heart's chambers and valves are working. In this test, a transducer is attached to the end of a flexible tube that's guided down your throat and into your esophagus (the tube leading from you mouth to your stomach) to get a more detailed image of your heart. You are not awake for the procedure. Please see the instruction sheet given to you today. For further information please visit [email or web address].   Labs drawn today (if we do not call you, then your lab work was stable)   Your physician recommends that you schedule a follow-up appointment in: 4 months with Dr. [name]

## 2018-02-03 NOTE — H&P (View-Only) (Signed)
Patient ID: Carl [name], male   DOB: [birth year], 67 y.o.   MRN: [id number] PCP: Dr. Ginette Pitman Cardiology: Dr. [name]  67 yo with history of smoking and mild mental retardation was admitted to [facility] in 12/15 with dyspnea.  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  LHC showed 3 vessel disease with EF 15%.  Echo showed EF 15-20%.  Patient had CABG x 5.  It was difficult to wean him off pressors post-op.  He ended up having to start midodrine but was later weaned off.   Admitted 10/17 with volume overload and AKI. Diuresed with IV lasix and transitiioned to 40 mg lasix daily. Spiro, dig, lisinopril stopped due to elevated creatinine 2.28. Discharge weight 163 pounds.    Echo in 2/18 showed EF 30-35%, mild LV dilation, rWMAs, moderate MR, mild to moderately decreased RV systolic function. Cardiolite in 2/18 showed large inferoseptal/inferior/inferolateral infarction with no ischemia.   Admitted to [facility] 06/10/17-06/12/17 with acute on chronic systolic CHF. Diuresed 5 pounds with IV Lasix. Discharge weight was 175 pounds.  Echo in 10/18 showed EF 20-25% with severe MR, possibly ischemic MR.   He returns today for followup of CHF.  SBP in 90s at home generally.  No lightheadedness.  No significant exertional dyspnea currently. Weight down 4 lbs.  No chest pain.  No orthopnea/PND. No lightheadedness or syncope.   Labs (12/15): K 4.1, creatinine 0.81, hgb 9.1 Labs (09/12/2014): K 3.7 Creatinine 0.96, digoxin 0.7 Labs (3/16): digoxin 0.8, LDL 62, HDL 40 Labs (4/16): K 4.3, creatinine 1.1 Labs (5/16) K 4.6, creatinine 1.24, digoxin 1.0 Labs (05/2016): K 3.6 Creatinine 1.47  => 1.37 Labs (11/17): LDL 61, HDL 41, K 4.2, creaitnine 1.1 Labs (2/18): K 3.7 => 5.3, creatinine 1.84 => 1.35, BNP 924 Labs (3/18): K 4.8, creatinine 1.33, BNP 552 Labs (9/18): K 3.9, creatinine 1.97 Labs (08/03/2018): K 4.2 Creatinine 1.6 Labs ( 09/09/2017): K 4.4 Creatinine 1.53 Labs (3/19): K 4.5, creatinine 1.5  ECG  (personally reviewed): NSR, old ASMI, old inferior MI  PMH: 1. Smoker 2. Mild mental retardation 3. CAD: LHC (12/15) with 3 vessel disease.  CABG x 5 in 12/15 with LIMA-LAD, SVG-D1, sequential SVG-OM2/OM3, SVG-PDA.   - Cardiolite (2/18):  Large inferoseptal/inferior/inferolateral infarction with no ischemia, EF 29%.  4. Ischemic cardiomyopathy: Echo (12/15) with EF 15-20%, wall motion abnormalities, mildly decreased RV systolic function, mild MR.  Echo (3/16) with EF 30-35%, severe LV dilation, moderate MR, PA systolic pressure 42 mmHg. Echo (8/16) with EF 25-30%, severely dilated LV, diffuse hypokinesis with inferior akinesis, restrictive diastolic function, RV mildly dilated with mildly decreased systolic function, moderate MR.  - Echo (10/17): EF 20-25%, moderate MR.  - Medtronic ICD.  - ACEI cough. Gynecomastia with spironolacon - Echo (2/18): EF 30-35%, mild LV dilation, regional WMAs, moderate diastolic dysfunction, normal RV size with mild to moderately decreased systolic function, moderate MR (likely infarct-related).  - Echo (10/18): EF 20-25%, severe MR (likely ischemic).  5. Depression 6. Vitiligo 7. Mitral regurgitation: Moderate on 2/18, likely infarct-related.  - Severe on 10/18 echo, likely infarct-related. 8. CKD stage III.  9. Melena- 08/24/2018 EGD/Colonoscopy polyp and gastritis.   SH: Lives alone, quit smoking in 12/15.  No ETOH.   FH: Brother and father with MIs.    ROS: All systems reviewed and negative except as per HPI.   Current Outpatient Medications  Medication Sig Dispense Refill  . aspirin 81 MG EC tablet Take 81 mg by mouth  daily.  11  . atorvastatin (LIPITOR) 40 MG tablet Take 1 tablet (40 mg total) by mouth daily at 6 PM. 30 tablet 1  . carvedilol (COREG) 3.125 MG tablet Take 3.125 mg by mouth 2 (two) times daily with a meal.    . citalopram (CELEXA) 20 MG tablet Take 1 tablet by mouth daily.  5  . cyanocobalamin 500 MCG tablet Take 500 mcg by mouth  daily.    Marland Kitchen eplerenone (INSPRA) 25 MG tablet Take 1 tablet (25 mg total) by mouth daily. 90 tablet 3  . ferrous sulfate 325 (65 FE) MG tablet Take 325 mg by mouth daily with breakfast.    . furosemide (LASIX) 80 MG tablet Take 1 tablet (80 mg total) by mouth 2 (two) times daily. 60 tablet 6  . losartan (COZAAR) 25 MG tablet Take 0.5 tablets (12.5 mg total) daily by mouth.    Marland Kitchen omeprazole (PRILOSEC) 20 MG capsule Take 20 mg by mouth 2 (two) times daily.    . potassium chloride SA (KLOR-CON M20) 20 MEQ tablet Take 1 tablet (20 mEq total) 2 (two) times daily by mouth. 60 tablet 3   No current facility-administered medications for this encounter.    BP 106/71   Pulse 73   Wt 185 lb (83.9 kg)   SpO2 100%   BMI 25.09 kg/m   Filed Weights   02/02/18 1442  Weight: 185 lb (83.9 kg)     General: NAD Neck: JVP 8 cm, no thyromegaly or thyroid nodule.  Lungs: Clear to auscultation bilaterally with normal respiratory effort. CV: Nondisplaced PMI.  Heart regular S1/S2, no S3/S4, 3/6 HSM apex.  No peripheral edema.  No carotid bruit.  Normal pedal pulses.  Abdomen: Soft, nontender, no hepatosplenomegaly, no distention.  Skin: Intact without lesions or rashes.  Neurologic: Alert and oriented x 3.  Psych: Normal affect. Extremities: No clubbing or cyanosis.  HEENT: Normal.   Assessment/Plan: 1. CAD: status post CABG.  Cardiolite in 2/18 showed infarction, no ischemia.  No chest pain.  - Continue ASA and statin.  Check lipids today.  2. Chronic systolic CHF: Ischemic CMP.  Echo (10/18) with EF 20-25%.  Has Medtronic ICD.  He is not volume overloaded on exam.  NYHA class II. SBP 90s at home, I will not titrate his current meds.  - Continue Lasix 80 mg bid. BMET today.  - Continue Coreg 3.125 mg bid. - Continue current eplerenone and losartan.  3. Depression: Continue Celexa 4. Mitral regurgitation: Severe, probably infarct-related.  He may be a good Mitraclip candidate.   - I will arrange for  TEE to assess mitral valve. We discussed risks/benefits and he agrees to proceed.  5. CKD Stage III: Creatinine baseline 1.3-1.5  - BMET today.  6. H/O GI bleed: Recent EGD/Colonoscopy with polyp noted and gastritis. No overt bleeding currently.   [name] 02/04/2018

## 2018-02-03 NOTE — Progress Notes (Signed)
Patient ID: Carl [name], male   DOB: 1950-10-29, 67 y.o.   MRN: [id number] PCP: Dr. Ginette Pitman Cardiology: Dr. [name]  67 yo with history of smoking and mild mental retardation was admitted to [facility] in 12/15 with dyspnea.  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  LHC showed 3 vessel disease with EF 15%.  Echo showed EF 15-20%.  Patient had CABG x 5.  It was difficult to wean him off pressors post-op.  He ended up having to start midodrine but was later weaned off.   Admitted 10/17 with volume overload and AKI. Diuresed with IV lasix and transitiioned to 40 mg lasix daily. Spiro, dig, lisinopril stopped due to elevated creatinine 2.28. Discharge weight 163 pounds.    Echo in 2/18 showed EF 30-35%, mild LV dilation, rWMAs, moderate MR, mild to moderately decreased RV systolic function. Cardiolite in 2/18 showed large inferoseptal/inferior/inferolateral infarction with no ischemia.   Admitted to [facility] 06/10/17-06/12/17 with acute on chronic systolic CHF. Diuresed 5 pounds with IV Lasix. Discharge weight was 175 pounds.  Echo in 10/18 showed EF 20-25% with severe MR, possibly ischemic MR.   He returns today for followup of CHF.  SBP in 90s at home generally.  No lightheadedness.  No significant exertional dyspnea currently. Weight down 4 lbs.  No chest pain.  No orthopnea/PND. No lightheadedness or syncope.   Labs (12/15): K 4.1, creatinine 0.81, hgb 9.1 Labs (09/12/2014): K 3.7 Creatinine 0.96, digoxin 0.7 Labs (3/16): digoxin 0.8, LDL 62, HDL 40 Labs (4/16): K 4.3, creatinine 1.1 Labs (5/16) K 4.6, creatinine 1.24, digoxin 1.0 Labs (05/2016): K 3.6 Creatinine 1.47  => 1.37 Labs (11/17): LDL 61, HDL 41, K 4.2, creaitnine 1.1 Labs (2/18): K 3.7 => 5.3, creatinine 1.84 => 1.35, BNP 924 Labs (3/18): K 4.8, creatinine 1.33, BNP 552 Labs (9/18): K 3.9, creatinine 1.97 Labs (08/03/2018): K 4.2 Creatinine 1.6 Labs ( 09/09/2017): K 4.4 Creatinine 1.53 Labs (3/19): K 4.5, creatinine 1.5  ECG  (personally reviewed): NSR, old ASMI, old inferior MI  PMH: 1. Smoker 2. Mild mental retardation 3. CAD: LHC (12/15) with 3 vessel disease.  CABG x 5 in 12/15 with LIMA-LAD, SVG-D1, sequential SVG-OM2/OM3, SVG-PDA.   - Cardiolite (2/18):  Large inferoseptal/inferior/inferolateral infarction with no ischemia, EF 29%.  4. Ischemic cardiomyopathy: Echo (12/15) with EF 15-20%, wall motion abnormalities, mildly decreased RV systolic function, mild MR.  Echo (3/16) with EF 30-35%, severe LV dilation, moderate MR, PA systolic pressure 42 mmHg. Echo (8/16) with EF 25-30%, severely dilated LV, diffuse hypokinesis with inferior akinesis, restrictive diastolic function, RV mildly dilated with mildly decreased systolic function, moderate MR.  - Echo (10/17): EF 20-25%, moderate MR.  - Medtronic ICD.  - ACEI cough. Gynecomastia with spironolacon - Echo (2/18): EF 30-35%, mild LV dilation, regional WMAs, moderate diastolic dysfunction, normal RV size with mild to moderately decreased systolic function, moderate MR (likely infarct-related).  - Echo (10/18): EF 20-25%, severe MR (likely ischemic).  5. Depression 6. Vitiligo 7. Mitral regurgitation: Moderate on 2/18, likely infarct-related.  - Severe on 10/18 echo, likely infarct-related. 8. CKD stage III.  9. Melena- 08/24/2018 EGD/Colonoscopy polyp and gastritis.   SH: Lives alone, quit smoking in 12/15.  No ETOH.   FH: Brother and father with MIs.    ROS: All systems reviewed and negative except as per HPI.   Current Outpatient Medications  Medication Sig Dispense Refill  . aspirin 81 MG EC tablet Take 81 mg by mouth  daily.  11  . atorvastatin (LIPITOR) 40 MG tablet Take 1 tablet (40 mg total) by mouth daily at 6 PM. 30 tablet 1  . carvedilol (COREG) 3.125 MG tablet Take 3.125 mg by mouth 2 (two) times daily with a meal.    . citalopram (CELEXA) 20 MG tablet Take 1 tablet by mouth daily.  5  . cyanocobalamin 500 MCG tablet Take 500 mcg by mouth  daily.    Marland Kitchen eplerenone (INSPRA) 25 MG tablet Take 1 tablet (25 mg total) by mouth daily. 90 tablet 3  . ferrous sulfate 325 (65 FE) MG tablet Take 325 mg by mouth daily with breakfast.    . furosemide (LASIX) 80 MG tablet Take 1 tablet (80 mg total) by mouth 2 (two) times daily. 60 tablet 6  . losartan (COZAAR) 25 MG tablet Take 0.5 tablets (12.5 mg total) daily by mouth.    Marland Kitchen omeprazole (PRILOSEC) 20 MG capsule Take 20 mg by mouth 2 (two) times daily.    . potassium chloride SA (KLOR-CON M20) 20 MEQ tablet Take 1 tablet (20 mEq total) 2 (two) times daily by mouth. 60 tablet 3   No current facility-administered medications for this encounter.    BP 106/71   Pulse 73   Wt 185 lb (83.9 kg)   SpO2 100%   BMI 25.09 kg/m   Filed Weights   02/02/18 1442  Weight: 185 lb (83.9 kg)     General: NAD Neck: JVP 8 cm, no thyromegaly or thyroid nodule.  Lungs: Clear to auscultation bilaterally with normal respiratory effort. CV: Nondisplaced PMI.  Heart regular S1/S2, no S3/S4, 3/6 HSM apex.  No peripheral edema.  No carotid bruit.  Normal pedal pulses.  Abdomen: Soft, nontender, no hepatosplenomegaly, no distention.  Skin: Intact without lesions or rashes.  Neurologic: Alert and oriented x 3.  Psych: Normal affect. Extremities: No clubbing or cyanosis.  HEENT: Normal.   Assessment/Plan: 1. CAD: status post CABG.  Cardiolite in 2/18 showed infarction, no ischemia.  No chest pain.  - Continue ASA and statin.  Check lipids today.  2. Chronic systolic CHF: Ischemic CMP.  Echo (10/18) with EF 20-25%.  Has Medtronic ICD.  He is not volume overloaded on exam.  NYHA class II. SBP 90s at home, I will not titrate his current meds.  - Continue Lasix 80 mg bid. BMET today.  - Continue Coreg 3.125 mg bid. - Continue current eplerenone and losartan.  3. Depression: Continue Celexa 4. Mitral regurgitation: Severe, probably infarct-related.  He may be a good Mitraclip candidate.   - I will arrange for  TEE to assess mitral valve. We discussed risks/benefits and he agrees to proceed.  5. CKD Stage III: Creatinine baseline 1.3-1.5  - BMET today.  6. H/O GI bleed: Recent EGD/Colonoscopy with polyp noted and gastritis. No overt bleeding currently.   [name] 02/04/2018

## 2018-02-04 ENCOUNTER — Telehealth (HOSPITAL_COMMUNITY): Payer: Self-pay

## 2018-02-04 NOTE — Telephone Encounter (Signed)
Carl [name] called stating his leg was hurting and he could not walk.  Carl [name] will not take Tylenol or nothing for pain.  He had broke his femur years ago and they did not set it right, he has pain in that leg all the time.  Today pain is worse, his sister was there and spoke with her.  She decided she would take him to [facility] urgent care.  His sister called after the visit and they are putting him on muscle relaxers at bedtime only.  If they do not work they will refer him to a specialist.  I will visit with him next week.   [place] [phone]

## 2018-02-06 ENCOUNTER — Other Ambulatory Visit (HOSPITAL_COMMUNITY): Payer: Self-pay | Admitting: Adult Health

## 2018-02-06 ENCOUNTER — Other Ambulatory Visit (HOSPITAL_COMMUNITY): Payer: Self-pay | Admitting: Cardiology

## 2018-02-09 ENCOUNTER — Encounter (HOSPITAL_COMMUNITY): Payer: Self-pay

## 2018-02-09 ENCOUNTER — Other Ambulatory Visit (HOSPITAL_COMMUNITY): Payer: Self-pay

## 2018-02-09 ENCOUNTER — Other Ambulatory Visit (HOSPITAL_COMMUNITY): Payer: Self-pay | Admitting: *Deleted

## 2018-02-09 DIAGNOSIS — I340 Nonrheumatic mitral (valve) insufficiency: Secondary | ICD-10-CM

## 2018-02-09 MED ORDER — FERROUS SULFATE 325 (65 FE) MG PO TABS: 325.0000 mg | ORAL_TABLET | Freq: Every day | ORAL | 3 refills | Status: AC

## 2018-02-09 NOTE — Progress Notes (Signed)
Bp: 86/52, pulse: 77, resp: 16, spo2: 97%, last weight: 179, todays weight: 178  Todays visit pt states leg still hurts but some better, pt getting around by scooting on his walker.  Pt states other than that feels good.  Pt denies shortness of breath, he states his activity level has decreased over past week due to his leg hurting. No swelling in extremities, abdominal area does not feel tight.  Meds were verified and pt has been taking his meds. His brother had filled his med boxes. Pt is aware of his TEE procedure tomorrow, his sister will be taking him.  Tried to contact lawyer but answer machine is full, gave a note to give lawyer at court about Dr [name] will send a statement if he needs it.  Also contacted Dr Ginette Pitman about a prescription for new shoes, advised [place] that he needs to buy a new pair and take them to Dr Ginette Pitman for [employer] in [place] to pick up on a Friday to place a bottom on them, they are familiar with what he needs done.  Will visit next week.   [place] [phone]

## 2018-02-10 ENCOUNTER — Other Ambulatory Visit: Payer: Self-pay

## 2018-02-10 ENCOUNTER — Ambulatory Visit (HOSPITAL_COMMUNITY)
Admission: RE | Admit: 2018-02-10 | Discharge: 2018-02-10 | Disposition: A | Discharge status: Home / Self Care | Payer: Medicare Other | Source: Ambulatory Visit | Attending: Cardiology | Admitting: Cardiology

## 2018-02-10 ENCOUNTER — Encounter (HOSPITAL_COMMUNITY)
Admission: RE | Disposition: A | Discharge status: Home / Self Care | Payer: Self-pay | Source: Ambulatory Visit | Attending: Cardiology

## 2018-02-10 ENCOUNTER — Encounter (HOSPITAL_COMMUNITY): Payer: Self-pay

## 2018-02-10 ENCOUNTER — Ambulatory Visit (HOSPITAL_BASED_OUTPATIENT_CLINIC_OR_DEPARTMENT_OTHER)
Admission: RE | Admit: 2018-02-10 | Discharge: 2018-02-10 | Disposition: A | Discharge status: Home / Self Care | Payer: Medicare Other | Source: Ambulatory Visit | Attending: Cardiology | Admitting: Cardiology

## 2018-02-10 DIAGNOSIS — Z951 Presence of aortocoronary bypass graft: Secondary | ICD-10-CM | POA: Diagnosis not present

## 2018-02-10 DIAGNOSIS — I255 Ischemic cardiomyopathy: Secondary | ICD-10-CM | POA: Diagnosis not present

## 2018-02-10 DIAGNOSIS — F329 Major depressive disorder, single episode, unspecified: Secondary | ICD-10-CM | POA: Diagnosis not present

## 2018-02-10 DIAGNOSIS — Z7982 Long term (current) use of aspirin: Secondary | ICD-10-CM | POA: Insufficient documentation

## 2018-02-10 DIAGNOSIS — I340 Nonrheumatic mitral (valve) insufficiency: Secondary | ICD-10-CM | POA: Insufficient documentation

## 2018-02-10 DIAGNOSIS — N183 Chronic kidney disease, stage 3 (moderate): Secondary | ICD-10-CM | POA: Diagnosis not present

## 2018-02-10 DIAGNOSIS — F70 Mild intellectual disabilities: Secondary | ICD-10-CM | POA: Diagnosis not present

## 2018-02-10 DIAGNOSIS — Z87891 Personal history of nicotine dependence: Secondary | ICD-10-CM | POA: Diagnosis not present

## 2018-02-10 DIAGNOSIS — I2510 Atherosclerotic heart disease of native coronary artery without angina pectoris: Secondary | ICD-10-CM | POA: Diagnosis present

## 2018-02-10 DIAGNOSIS — I5022 Chronic systolic (congestive) heart failure: Secondary | ICD-10-CM | POA: Insufficient documentation

## 2018-02-10 HISTORY — PX: TEE WITHOUT CARDIOVERSION: SHX5443

## 2018-02-10 SURGERY — ECHOCARDIOGRAM, TRANSESOPHAGEAL
Anesthesia: Moderate Sedation

## 2018-02-10 MED ORDER — MIDAZOLAM HCL 5 MG/5ML IJ SOLN
INTRAMUSCULAR | Status: DC | PRN
  Administered 2018-02-10: 1 mg via INTRAVENOUS

## 2018-02-10 MED ORDER — MIDAZOLAM HCL 5 MG/ML IJ SOLN
INTRAMUSCULAR | Status: AC
  Filled 2018-02-10: qty 2

## 2018-02-10 MED ORDER — BUTAMBEN-TETRACAINE-BENZOCAINE 2-2-14 % EX AERO
INHALATION_SPRAY | CUTANEOUS | Status: DC | PRN
  Administered 2018-02-10: 2 via TOPICAL

## 2018-02-10 MED ORDER — FENTANYL CITRATE (PF) 100 MCG/2ML IJ SOLN
INTRAMUSCULAR | Status: AC
  Filled 2018-02-10: qty 2

## 2018-02-10 MED ORDER — SODIUM CHLORIDE 0.9 % IV SOLN
INTRAVENOUS | Status: DC
  Administered 2018-02-10: 500 mL via INTRAVENOUS

## 2018-02-10 MED ORDER — FENTANYL CITRATE (PF) 100 MCG/2ML IJ SOLN
INTRAMUSCULAR | Status: DC | PRN
  Administered 2018-02-10: 25 ug via INTRAVENOUS

## 2018-02-10 NOTE — Discharge Instructions (Signed)

## 2018-02-10 NOTE — Progress Notes (Signed)
  Echocardiogram Echocardiogram Transesophageal has been performed.  Jabri Blancett T Shireen Rayburn 02/10/2018, 11:58 AM

## 2018-02-10 NOTE — Interval H&P Note (Signed)
History and Physical Interval Note:  02/10/2018 11:31 AM  Carl [name]  has presented today for surgery, with the diagnosis of MITRAL REGURGITATION  The various methods of treatment have been discussed with the patient and family. After consideration of risks, benefits and other options for treatment, the patient has consented to  Procedure(s): TRANSESOPHAGEAL ECHOCARDIOGRAM (TEE) (N/A) as a surgical intervention .  The patient's history has been reviewed, patient examined, no change in status, stable for surgery.  I have reviewed the patient's chart and labs.  Questions were answered to the patient's satisfaction.     [place] [employer]

## 2018-02-10 NOTE — CV Procedure (Signed)
Procedure: TEE  Sedation: Versed 1 mg IV, Fentanyl 25 mcg IV  Indication: Mitral regurgitation  Findings: Please see echo section for full report.  Mildly dilated LV with diffuse hypokinesis and inferior/inferolateral akinesis.  EF 25-30%.  Mildly dilated RV with mild to moderately decreased systolic function.  Moderate left atrial enlargement.  Mild right atrial enlargement.  No PFO/ASD, negative bubble study.  ICD noted in right heart.  Moderate TR, peak RV-RA gradient 23 mmHg.  Trileaflet aortic valve with trivial regurgitation, no stenosis.  There was restriction of the posterior mitral leaflet with severe MR, ERO 0.55 cm^2.  There was flattening of the pulmonary vein systolic doppler signal.  Normal caliber aorta with mild plaque.   Impression: Severe mitral regurgitation, suspect functional MR, likely combination of ischemic MR + MR from dilated annulus.    Will refer for Mitraclip evaluation.   Carl [name] 02/10/2018 11:51 AM

## 2018-02-11 ENCOUNTER — Encounter (HOSPITAL_COMMUNITY): Payer: Self-pay | Admitting: Cardiology

## 2018-02-13 ENCOUNTER — Encounter: Payer: Self-pay | Admitting: Emergency Medicine

## 2018-02-13 ENCOUNTER — Other Ambulatory Visit: Payer: Self-pay

## 2018-02-13 DIAGNOSIS — Z79899 Other long term (current) drug therapy: Secondary | ICD-10-CM | POA: Insufficient documentation

## 2018-02-13 DIAGNOSIS — Z7982 Long term (current) use of aspirin: Secondary | ICD-10-CM | POA: Diagnosis not present

## 2018-02-13 DIAGNOSIS — Z87891 Personal history of nicotine dependence: Secondary | ICD-10-CM | POA: Insufficient documentation

## 2018-02-13 DIAGNOSIS — S20211A Contusion of right front wall of thorax, initial encounter: Secondary | ICD-10-CM | POA: Diagnosis not present

## 2018-02-13 DIAGNOSIS — Y929 Unspecified place or not applicable: Secondary | ICD-10-CM | POA: Diagnosis not present

## 2018-02-13 DIAGNOSIS — Z951 Presence of aortocoronary bypass graft: Secondary | ICD-10-CM | POA: Diagnosis not present

## 2018-02-13 DIAGNOSIS — I5022 Chronic systolic (congestive) heart failure: Secondary | ICD-10-CM | POA: Diagnosis not present

## 2018-02-13 DIAGNOSIS — Y999 Unspecified external cause status: Secondary | ICD-10-CM | POA: Insufficient documentation

## 2018-02-13 DIAGNOSIS — I2510 Atherosclerotic heart disease of native coronary artery without angina pectoris: Secondary | ICD-10-CM | POA: Diagnosis not present

## 2018-02-13 DIAGNOSIS — Y9389 Activity, other specified: Secondary | ICD-10-CM | POA: Diagnosis not present

## 2018-02-13 DIAGNOSIS — S20301A Unspecified superficial injuries of right front wall of thorax, initial encounter: Secondary | ICD-10-CM | POA: Diagnosis present

## 2018-02-13 DIAGNOSIS — W1839XA Other fall on same level, initial encounter: Secondary | ICD-10-CM | POA: Insufficient documentation

## 2018-02-13 DIAGNOSIS — I252 Old myocardial infarction: Secondary | ICD-10-CM | POA: Insufficient documentation

## 2018-02-13 NOTE — ED Triage Notes (Signed)
Pt says he was using his walker when he tried to get into the car and fell; c/o pain to right outer chest well; no abrasions/contusions noted to area; pt says pain increases when he coughs;

## 2018-02-14 ENCOUNTER — Emergency Department: Payer: Medicare Other

## 2018-02-14 ENCOUNTER — Emergency Department
Admission: EM | Admit: 2018-02-14 | Discharge: 2018-02-14 | Disposition: A | Discharge status: Home / Self Care | Payer: Medicare Other | Attending: Emergency Medicine | Admitting: Emergency Medicine

## 2018-02-14 DIAGNOSIS — S20211A Contusion of right front wall of thorax, initial encounter: Secondary | ICD-10-CM

## 2018-02-14 MED ORDER — IBUPROFEN 600 MG PO TABS
600.0000 mg | ORAL_TABLET | Freq: Once | ORAL | Status: AC
  Administered 2018-02-14: 600 mg via ORAL
  Filled 2018-02-14: qty 1

## 2018-02-14 MED ORDER — IBUPROFEN 600 MG PO TABS: 600.0000 mg | ORAL_TABLET | Freq: Three times a day (TID) | ORAL | 0 refills | Status: DC | PRN

## 2018-02-14 NOTE — ED Notes (Signed)
ED Provider at bedside. 

## 2018-02-14 NOTE — ED Notes (Signed)
Updated patient and family regarding wait.  Verbalized understanding.

## 2018-02-14 NOTE — Discharge Instructions (Signed)
Please take your pain medication as needed for severe symptoms and follow-up with your primary care physician as needed.  Return to the emergency department sooner for any concerns.  It was a pleasure to take care of you today, and thank you for coming to our emergency department.  If you have any questions or concerns before leaving please ask the nurse to grab me and I'm more than happy to go through your aftercare instructions again.  If you were prescribed any opioid pain medication today such as Norco, Vicodin, Percocet, morphine, hydrocodone, or oxycodone please make sure you do not drive when you are taking this medication as it can alter your ability to drive safely.  If you have any concerns once you are home that you are not improving or are in fact getting worse before you can make it to your follow-up appointment, please do not hesitate to call 911 and come back for further evaluation.  Darel Hong, MD  Results for orders placed or performed during the hospital encounter of 95/62/13  Basic metabolic panel  Result Value Ref Range   Sodium 138 135 - 145 mmol/L   Potassium 3.9 3.5 - 5.1 mmol/L   Chloride 102 101 - 111 mmol/L   CO2 27 22 - 32 mmol/L   Glucose, Bld 99 65 - 99 mg/dL   BUN 33 (H) 6 - 20 mg/dL   Creatinine, Ser 1.83 (H) 0.61 - 1.24 mg/dL   Calcium 9.3 8.9 - 10.3 mg/dL   GFR calc non Af Amer 37 (L) >60 mL/min   GFR calc Af Amer 43 (L) >60 mL/min   Anion gap 9 5 - 15  Lipid panel  Result Value Ref Range   Cholesterol 133 0 - 200 mg/dL   Triglycerides 41 <150 mg/dL   HDL 54 >40 mg/dL   Total CHOL/HDL Ratio 2.5 RATIO   VLDL 8 0 - 40 mg/dL   LDL Cholesterol 71 0 - 99 mg/dL   Dg Chest 2 View  Result Date: 02/14/2018 CLINICAL DATA:  Fall with rib pain EXAM: CHEST - 2 VIEW COMPARISON:  08/13/2017 FINDINGS: Post sternotomy changes. Similar appearance of left pacing device. Cardiomegaly with vascular congestion and mild interstitial edema. No pneumothorax. IMPRESSION:  Cardiomegaly with vascular congestion and mild interstitial edema. Negative for pneumothorax or pleural effusion. Electronically Signed   By: Donavan Foil M.D.   On: 02/14/2018 00:44

## 2018-02-14 NOTE — ED Provider Notes (Signed)
[facility] Emergency Department Provider Note  ____________________________________________   First MD Initiated Contact with Patient 02/14/18 [phone]     (approximate)  I have reviewed the triage vital signs and the nursing notes.   HISTORY  Chief Complaint Fall and Chest Pain   HPI Carl [name] is a 67 y.o. male who self presents to the emergency department after mechanical fall this afternoon sustaining pain to the right side of his chest.  He is walking with his walker trying to get out of his car when he fell forward onto his right chest.  He had sudden onset moderate severity pain in his right lower lateral chest.  Pain is worse with deep inspiration somewhat improved with rest.  No abdominal pain nausea or vomiting.  He did not hit his head.  He did not lose consciousness.  He did not syncopized.  There were no antecedent chest pain or palpitations.  He feels roughly back to his baseline now.  His pain is worse when taking a deep breath or coughing.  His pain is nonradiating.    Past Medical History:  Diagnosis Date  . Anemia   . Anxiety   . Arthritis   . Brunner's gland hyperplasia of duodenum   . CHF (congestive heart failure) ([place])   . Coronary artery disease   . External hemorrhoids   . GERD (gastroesophageal reflux disease)   . Hearing loss   . HH (hiatus hernia)   . Internal hemorrhoids   . Ischemic cardiomyopathy   . Leg fracture, left   . Myocardial infarction ([place])   . Paronychia   . Psoriasis   . PUD (peptic ulcer disease)   . Schatzki's ring   . Tubular adenoma of colon   . Vitiligo     Patient Active Problem List   Diagnosis Date Noted  . Lower extremity pain, bilateral 11/12/2017  . Hyperlipidemia 09/01/2017  . Essential hypertension 09/01/2017  . Acute on chronic systolic (congestive) heart failure ([place]) 06/11/2017  . Prostate cancer screening 04/28/2017  . Hydrocele 04/28/2017  . Bradycardia   . Premature ventricular  contraction   . Systolic dysfunction with acute on chronic heart failure ([place])   . Non-rheumatic mitral regurgitation   . Chronic systolic dysfunction of left ventricle 05/01/2015  . Cardiomyopathy, ischemic 04/26/2015  . Difficulty hearing 12/29/2014  . Acute on chronic systolic CHF (congestive heart failure) ([place]) 10/10/2014  . 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 Acute systolic heart failure ([place]) 08/01/2014  . Protein-calorie malnutrition, severe ([place]) 07/29/2014  . ST elevation myocardial infarction (STEMI) of anterolateral wall ([place]) 07/29/2014  . Cardiogenic shock ([place]) 07/29/2014  . Tobacco use 07/29/2014  . AKI (acute kidney injury) ([place]) 07/29/2014    Past Surgical History:  Procedure Laterality Date  . CARDIAC SURGERY    . COLONOSCOPY WITH ESOPHAGOGASTRODUODENOSCOPY (EGD)    . COLONOSCOPY WITH PROPOFOL N/A 08/24/2017   Procedure: COLONOSCOPY WITH PROPOFOL;  Surgeon: [name], MD;  Location: [facility] ENDOSCOPY;  Service: Endoscopy;  Laterality: N/A;  . CORONARY ARTERY BYPASS GRAFT N/A 08/03/2014   Procedure: CORONARY ARTERY BYPASS GRAFTING (CABG);  Surgeon: [name], MD;  Location: [place];  Service: Open Heart Surgery;  Laterality: N/A;  . EP IMPLANTABLE DEVICE N/A 05/01/2015   MDT ICD implanted for primary prevention of sudden death  . ESOPHAGOGASTRODUODENOSCOPY (EGD) WITH PROPOFOL N/A 04/16/2015   Procedure: ESOPHAGOGASTRODUODENOSCOPY (EGD) WITH PROPOFOL;  Surgeon: [name], MD;  Location[facility] ENDOSCOPY;  Service:  Endoscopy;  Laterality: N/A;  . ESOPHAGOGASTRODUODENOSCOPY (EGD) WITH PROPOFOL N/A 08/24/2017   Procedure: ESOPHAGOGASTRODUODENOSCOPY (EGD) WITH PROPOFOL;  Surgeon: [name], MD;  Location: [facility] ENDOSCOPY;  Service: Endoscopy;  Laterality: N/A;  . FRACTURE SURGERY    . HERNIA REPAIR    . TEE WITHOUT CARDIOVERSION N/A 08/03/2014   Procedure: TRANSESOPHAGEAL ECHOCARDIOGRAM (TEE);  Surgeon: [name], MD;  Location: [place];  Service: Open  Heart Surgery;  Laterality: N/A;  . TEE WITHOUT CARDIOVERSION N/A 02/10/2018   Procedure: TRANSESOPHAGEAL ECHOCARDIOGRAM (TEE);  Surgeon: [name], MD;  Location: [facility] ENDOSCOPY;  Service: Cardiovascular;  Laterality: N/A;    Prior to Admission medications   Medication Sig Start Date End Date Taking? Authorizing Provider  alum & mag hydroxide-simeth (MAALOX REGULAR STRENGTH) 200-200-20 MG/5ML suspension Take 15 mLs by mouth every 6 (six) hours as needed for indigestion or heartburn.    [provider]  aspirin 81 MG EC tablet Take 81 mg by mouth daily. 09/21/17   [provider]  atorvastatin (LIPITOR) 40 MG tablet Take 1 tablet (40 mg total) by mouth daily at 6 PM. 08/15/14   Gold, Patrick Jupiter E, PA-C  carvedilol (COREG) 3.125 MG tablet Take 3.125 mg by mouth 2 (two) times daily with a meal.    [provider]  citalopram (CELEXA) 20 MG tablet Take 20 mg by mouth daily.  07/09/16   [provider]  cyanocobalamin 500 MCG tablet Take 500 mcg by mouth daily.    [provider]  eplerenone (INSPRA) 25 MG tablet Take 1 tablet (25 mg total) by mouth daily. 01/11/18   [name], MD  ferrous sulfate 325 (65 FE) MG tablet Take 1 tablet (325 mg total) by mouth daily with breakfast. 02/09/18   [name], MD  furosemide (LASIX) 80 MG tablet TAKE 1 TABLET (80 MG TOTAL) BY MOUTH 2 (TWO) TIMES DAILY. 02/08/18   Bensimhon, Shaune Pascal, MD  ibuprofen (ADVIL,MOTRIN) 600 MG tablet Take 1 tablet (600 mg total) by mouth every 8 (eight) hours as needed. 02/14/18   Darel Hong, MD  KLOR-CON M20 20 MEQ tablet TAKE 1 TABLET (20 MEQ TOTAL) 2 (TWO) TIMES DAILY BY MOUTH. 02/08/18   Bensimhon, Shaune Pascal, MD  losartan (COZAAR) 25 MG tablet Take 0.5 tablets (12.5 mg total) daily by mouth. 07/04/17   Theora Gianotti, NP  omeprazole (PRILOSEC) 20 MG capsule Take 20 mg by mouth 2 (two) times daily.    [provider]    Allergies Spironolactone  Family  History  Problem Relation Age of Onset  . Valvular heart disease Mother        Ruptured valve  . CAD Father   . Heart Problems Brother        Stents x 4  . Diabetes Brother   . Prostate cancer Neg Hx   . Bladder Cancer Neg Hx   . Kidney cancer Neg Hx     Social History Social History   Tobacco Use  . Smoking status: Former Smoker    Last attempt to quit: 2014    Years since quitting: 5.4  . Smokeless tobacco: Never Used  . Tobacco comment: 07/30/2017 Quit in 2014  Substance Use Topics  . Alcohol use: No    Alcohol/week: 0.0 oz  . Drug use: No    Review of Systems Constitutional: No fever/chills Eyes: No visual changes. ENT: No sore throat. Cardiovascular: Positive for chest pain. Respiratory: Positive for shortness of breath. Gastrointestinal: No  abdominal pain.  No nausea, no vomiting.  No diarrhea.  No constipation. Genitourinary: Negative for dysuria. Musculoskeletal: Negative for back pain. Skin: Negative for rash. Neurological: Negative for headaches, focal weakness or numbness.   ____________________________________________   PHYSICAL EXAM:  VITAL SIGNS: ED Triage Vitals  Enc Vitals Group     BP 02/13/18 2331 111/70     Pulse Rate 02/13/18 2331 76     Resp 02/13/18 2331 18     Temp 02/13/18 2331 98.7 F (37.1 C)     Temp Source 02/13/18 2331 Oral     SpO2 02/13/18 2331 97 %     Weight 02/13/18 2332 178 lb (80.7 kg)     Height 02/13/18 2332 6' (1.829 m)     Head Circumference --      Peak Flow --      Pain Score 02/13/18 2332 9     Pain Loc --      Pain Edu? --      Excl. in [place]? --     Constitutional: Alert and oriented x4 pleasant cooperative no distress Eyes: PERRL EOMI. Head: Atraumatic. Nose: No congestion/rhinnorhea. Mouth/Throat: No trismus Neck: No stridor.   Cardiovascular: Normal rate, regular rhythm. Grossly normal heart sounds.  Good peripheral circulation. Chest wall stable he is somewhat tender over lower right lateral chest  although with no crepitus and no rash Respiratory: Normal respiratory effort.  No retractions. Lungs CTAB and moving good air Gastrointestinal: Soft nontender Musculoskeletal: No lower extremity edema   Neurologic:  Normal speech and language. No gross focal neurologic deficits are appreciated. Skin:  Skin is warm, dry and intact. No rash noted. Psychiatric: Mood and affect are normal. Speech and behavior are normal.    ____________________________________________   DIFFERENTIAL includes but not limited to  Rib fracture, rib contusion, pneumothorax, pulmonary contusion, hemothorax, liver laceration ____________________________________________   LABS (all labs ordered are listed, but only abnormal results are displayed)  Labs Reviewed - No data to display   __________________________________________  EKG   ____________________________________________  RADIOLOGY  Chest x-ray reviewed by me with no acute disease ____________________________________________   PROCEDURES  Procedure(s) performed: no  Procedures  Critical Care performed: no  ____________________________________________   INITIAL IMPRESSION / ASSESSMENT AND PLAN / ED COURSE  Pertinent labs & imaging results that were available during my care of the patient were reviewed by me and considered in my medical decision making (see chart for details).   By the time I saw the patient he is back to his baseline.  X-ray is unremarkable.  His chest wall is mildly tender and he very well could have a contusion versus a slightly separated rib.  Given a single dose of ibuprofen with improvement in symptoms.  Strict return precautions have been given and the patient verbalizes understanding and agreement with plan.      ____________________________________________   FINAL CLINICAL IMPRESSION(S) / ED DIAGNOSES  Final diagnoses:  Contusion of rib on right side, initial encounter      NEW MEDICATIONS STARTED  DURING THIS VISIT:  Discharge Medication List as of 02/14/2018  3:41 AM    START taking these medications   Details  ibuprofen (ADVIL,MOTRIN) 600 MG tablet Take 1 tablet (600 mg total) by mouth every 8 (eight) hours as needed., Starting Sun 02/14/2018, Print         Note:  This document was prepared using Dragon voice recognition software and may include unintentional dictation errors.     Darel Hong, MD 02/14/18  2331  

## 2018-02-16 ENCOUNTER — Encounter (HOSPITAL_COMMUNITY): Payer: Self-pay

## 2018-02-16 ENCOUNTER — Other Ambulatory Visit (HOSPITAL_COMMUNITY): Payer: Self-pay

## 2018-02-16 NOTE — Progress Notes (Signed)
Bp: 78/48, pulse: 63, resp: 16, spo2: 95, last weight: 178, todays weight: 174.8  Todays visit patient complains his leg still hurts.  He has been to his doctor about his leg hurting.  He is scooting around on a walker, advised him to be careful.  He states it hurts to stand and walk.  Advised him that it is made to sit on, not to scoot around on, it may become unstable and flip.  Pt denies shortness of breath, he states been staying away from fried foods.  He denies chest pain.  Pt denies headaches or dizziness.  Pt states he feels good except for pain in leg.  Pt has no swelling in extremities.  Lung sounds are clear.  Meds verified and 3 weeks med boxes filled.  Will visit him in 2 weeks.   [place] [phone]

## 2018-02-18 ENCOUNTER — Ambulatory Visit (INDEPENDENT_AMBULATORY_CARE_PROVIDER_SITE_OTHER): Payer: Medicare Other | Admitting: Cardiovascular Disease

## 2018-02-18 ENCOUNTER — Encounter: Payer: Self-pay | Admitting: Cardiovascular Disease

## 2018-02-18 ENCOUNTER — Telehealth: Payer: Self-pay

## 2018-02-18 VITALS — BP 112/74 | HR 64 | Ht 72.0 in | Wt 179.4 lb

## 2018-02-18 DIAGNOSIS — I5022 Chronic systolic (congestive) heart failure: Secondary | ICD-10-CM

## 2018-02-18 DIAGNOSIS — I340 Nonrheumatic mitral (valve) insufficiency: Secondary | ICD-10-CM | POA: Diagnosis not present

## 2018-02-18 NOTE — Patient Instructions (Signed)
Medication Instructions:  Your physician recommends that you continue on your current medications as directed. Please refer to the Current Medication list given to you today.  Labwork: None  Testing/Procedures: None  Follow-Up: We will contact you and have you come into the office once we can move forward with the procedure.   Any Other Special Instructions Will Be Listed Below (If Applicable).     If you need a refill on your cardiac medications before your next appointment, please call your pharmacy.

## 2018-02-18 NOTE — Telephone Encounter (Signed)
Received call from brother [name]).  He asked if Dr [name] would write a letter for patient stating he was clear to drive.  Patient had an incident about 1 1/2 years ago that he received driving under the influence due to pain medication he was taking from the Chiropractor.  He reported patient is no longer taking pain medication and needs a clearance to drive letter for the court date tomorrow.  Advised to call PCP since this is not a cardiac related event.  He said he would attempt to get a letter from that office.

## 2018-02-18 NOTE — Progress Notes (Signed)
Cardiology Office Note Date:  02/19/2018   ID:  Carl [name], DOB 1951-05-10, MRN [id number]  PCP:  [name], MD  Cardiologist:  [name], MD    Chief Complaint  Patient presents with  . Advice Only    Mitra clip eval  . Shortness of Breath     History of Present Illness: Carl [name] is a 67 y.o. male who presents for evaluation of severe mitral regurgitation.  The patient is here with his sister today.  He has mild mental retardation but has been able to be fairly independent over time.  His brother has been his medical power of attorney.  The patient initially presented in 2013-12-11 with congestive heart failure and non-STEMI.  He was found to have severe LV dysfunction with an LVEF of 15 to 25% and severe three-vessel coronary artery disease.  He underwent 5 vessel CABG with a LIMA to LAD, saphenous vein graft to diagonal, sequential saphenous vein graft to OM 2 and OM 3, and saphenous vein graft to PDA.  Left ventricular function did not improve much after surgery and he has subsequently undergone ICD implantation.  He has a history of moderate mitral regurgitation in the past, but on more recent echocardiogram studies he has been found to have severe mitral regurgitation.  He underwent a TEE recently demonstrating severe mitral regurgitation with annular dilatation and posterior leaflet  Restriction.  He is referred for consideration of percutaneous edge to edge mitral valve approximation with MitraClip.  The patient is here with his sister today. He lives alone. His had previously lived with his mother, but she passed away in 2016-12-11. He lives in [place]. He is walking with a walker but only recently. He has problems with his back and left leg. He worked in a [employer] when he was young but he had a severe leg injury and was on disability for many years. He admits to mild shortness of breath with walking or other physical activities. He denies orthopnea or PND. He has leg  swelling but notes this is improved with compression socks.   His last hospital admission for congestive heart failure was in October 2018 at [facility].  He was seen in the emergency room earlier this week after a fall with a rib contusion.  States that his leg gave out when he was walking.  They have a lot of questions about pain medications for his back directed them to their primary physician.  Past Medical History:  Diagnosis Date  . Anemia   . Anxiety   . Arthritis   . Brunner's gland hyperplasia of duodenum   . CHF (congestive heart failure) ([place])   . Coronary artery disease   . External hemorrhoids   . GERD (gastroesophageal reflux disease)   . Hearing loss   . HH (hiatus hernia)   . Internal hemorrhoids   . Ischemic cardiomyopathy   . Leg fracture, left   . Myocardial infarction ([place])   . Paronychia   . Psoriasis   . PUD (peptic ulcer disease)   . Schatzki's ring   . Tubular adenoma of colon   . Vitiligo     Past Surgical History:  Procedure Laterality Date  . CARDIAC SURGERY    . COLONOSCOPY WITH ESOPHAGOGASTRODUODENOSCOPY (EGD)    . COLONOSCOPY WITH PROPOFOL N/A 08/24/2017   Procedure: COLONOSCOPY WITH PROPOFOL;  Surgeon: [name], MD;  Location: [facility] ENDOSCOPY;  Service: Endoscopy;  Laterality: N/A;  . CORONARY ARTERY  BYPASS GRAFT N/A 08/03/2014   Procedure: CORONARY ARTERY BYPASS GRAFTING (CABG);  Surgeon: [name], MD;  Location: [place];  Service: Open Heart Surgery;  Laterality: N/A;  . EP IMPLANTABLE DEVICE N/A 05/01/2015   MDT ICD implanted for primary prevention of sudden death  . ESOPHAGOGASTRODUODENOSCOPY (EGD) WITH PROPOFOL N/A 04/16/2015   Procedure: ESOPHAGOGASTRODUODENOSCOPY (EGD) WITH PROPOFOL;  Surgeon: [name], MD;  Location: [facility] ENDOSCOPY;  Service: Endoscopy;  Laterality: N/A;  . ESOPHAGOGASTRODUODENOSCOPY (EGD) WITH PROPOFOL N/A 08/24/2017   Procedure: ESOPHAGOGASTRODUODENOSCOPY (EGD) WITH PROPOFOL;   Surgeon: [name], MD;  Location: [facility] ENDOSCOPY;  Service: Endoscopy;  Laterality: N/A;  . FRACTURE SURGERY    . HERNIA REPAIR    . TEE WITHOUT CARDIOVERSION N/A 08/03/2014   Procedure: TRANSESOPHAGEAL ECHOCARDIOGRAM (TEE);  Surgeon: [name], MD;  Location: [place];  Service: Open Heart Surgery;  Laterality: N/A;  . TEE WITHOUT CARDIOVERSION N/A 02/10/2018   Procedure: TRANSESOPHAGEAL ECHOCARDIOGRAM (TEE);  Surgeon: [name], MD;  Location: [facility] ENDOSCOPY;  Service: Cardiovascular;  Laterality: N/A;    Current Outpatient Medications  Medication Sig Dispense Refill  . acetaminophen (TYLENOL) 325 MG tablet Take 325 mg by mouth every 6 (six) hours as needed.    Marland Kitchen aspirin 81 MG EC tablet Take 81 mg by mouth daily.  11  . atorvastatin (LIPITOR) 40 MG tablet Take 1 tablet (40 mg total) by mouth daily at 6 PM. 30 tablet 1  . carvedilol (COREG) 3.125 MG tablet Take 3.125 mg by mouth 2 (two) times daily with a meal.    . citalopram (CELEXA) 20 MG tablet Take 20 mg by mouth daily.   5  . cyanocobalamin 500 MCG tablet Take 500 mcg by mouth daily.    . cyclobenzaprine (FLEXERIL) 10 MG tablet Take 10 mg by mouth as needed.  0  . eplerenone (INSPRA) 25 MG tablet Take 1 tablet (25 mg total) by mouth daily. 90 tablet 3  . ferrous sulfate 325 (65 FE) MG tablet Take 1 tablet (325 mg total) by mouth daily with breakfast. 30 tablet 3  . furosemide (LASIX) 80 MG tablet TAKE 1 TABLET (80 MG TOTAL) BY MOUTH 2 (TWO) TIMES DAILY. 60 tablet 4  . ibuprofen (ADVIL,MOTRIN) 600 MG tablet Take 1 tablet (600 mg total) by mouth every 8 (eight) hours as needed. 30 tablet 0  . KLOR-CON M20 20 MEQ tablet TAKE 1 TABLET (20 MEQ TOTAL) 2 (TWO) TIMES DAILY BY MOUTH. 60 tablet 2  . losartan (COZAAR) 25 MG tablet Take 0.5 tablets (12.5 mg total) daily by mouth.    Marland Kitchen omeprazole (PRILOSEC) 20 MG capsule Take 20 mg by mouth 2 (two) times daily.     No current facility-administered medications for this visit.      Allergies:   Spironolactone   Social History:  The patient  reports that he quit smoking about 5 years ago. He has never used smokeless tobacco. He reports that he does not drink alcohol or use drugs.   Family History:  The patient's family history includes CAD in his father; Diabetes in his brother; Heart Problems in his brother; Valvular heart disease in his mother.    ROS:  Please see the history of present illness.  Otherwise, review of systems is positive for left leg pain and weakness, back pain.  All other systems are reviewed and negative.    PHYSICAL EXAM: VS:  BP 112/74   Pulse 64   Ht 6' (1.829 m)  Wt 179 lb 6.4 oz (81.4 kg)   SpO2 95%   BMI 24.33 kg/m  , BMI Body mass index is 24.33 kg/m. GEN: Well nourished, well developed, in no acute distress  HEENT: normal  Neck: no JVD, no masses. No carotid bruits Cardiac: RRR with grade 3/6 holosystolic murmur best heard at the apex      Respiratory:  clear to auscultation bilaterally, normal work of breathing GI: soft, nontender, nondistended, + BS MS: no deformity or atrophy  Ext: no pretibial edema, pedal pulses 2+= bilaterally Skin: warm and dry, no rash Neuro:  Strength and sensation are intact Psych: euthymic mood, full affect  EKG:  EKG is not ordered today.  Recent Labs: 06/10/2017: B Natriuretic Peptide 843.0 06/11/2017: TSH 0.357 08/13/2017: ALT 20; Hemoglobin 11.1; Platelets 215 02/02/2018: BUN 33; Creatinine, Ser 1.83; Potassium 3.9; Sodium 138   Lipid Panel     Component Value Date/Time   CHOL 133 02/02/2018 1645   TRIG 41 02/02/2018 1645   HDL 54 02/02/2018 1645   CHOLHDL 2.5 02/02/2018 1645   VLDL 8 02/02/2018 1645   LDLCALC 71 02/02/2018 1645      Wt Readings from Last 3 Encounters:  02/18/18 179 lb 6.4 oz (81.4 kg)  02/16/18 174 lb 12.8 oz (79.3 kg)  02/13/18 178 lb (80.7 kg)     Cardiac Studies Reviewed: Echo 06-11-2017: Study Conclusions  - Left ventricle: The cavity size was  moderately dilated. Wall   thickness was normal. Systolic function was severely reduced. The   estimated ejection fraction was in the range of 20% to 25%.   Severe hypokinesis of the inferolateral, inferior, and   inferoseptal myocardium. Features are consistent with a   pseudonormal left ventricular filling pattern, with concomitant   abnormal relaxation and increased filling pressure (grade 2   diastolic dysfunction). - Mitral valve: There was severe regurgitation directed   posteriorly. Likely ischemic MR. - Left atrium: The atrium was moderately dilated. - Right ventricle: The cavity size was mildly dilated. Wall   thickness was normal. Systolic function was mildly reduced. - Right atrium: The atrium was mildly dilated. - Pulmonary arteries: Systolic pressure was at the upper limits of   normal. PA peak pressure: 33 mm Hg (S).  Impressions:  - Compared to echo from 09/2016, the EF decreased from 30% and   mitral regurgitation worsened.  TEE: Study Conclusions  - Left ventricle: Mildly dilated LV with diffuse hypokinesis and   inferior/inferolateral akinesis. EF 25-30%. - Aortic valve: There was no stenosis. There was trivial   regurgitation. - Aorta: Normal caliber aorta with mild plaque. - Mitral valve: There was restriction of the posterior mitral   leaflet with severe MR, ERO 0.55 cm^2. There was flattening of   the pulmonary vein systolic doppler signal. - Left atrium: The atrium was moderately dilated. - Right ventricle: The cavity size was mildly dilated. Pacer wire   or catheter noted in right ventricle. Systolic function was   mildly to moderately reduced. - Right atrium: The atrium was mildly dilated. - Atrial septum: No defect or patent foramen ovale was identified.   Echo contrast study showed no right-to-left atrial level shunt,   at baseline or with provocation. - Tricuspid valve: There was moderate regurgitation. Peak RV-RA   gradient (S): 23 mm Hg. -  Pulmonic valve: No evidence of vegetation.  Impressions:  - Severe mitral regurgitation, suspect functional MR, likely   combination of ischemic MR + MR from dilated annulus.  ASSESSMENT AND PLAN: This is a 67 year old male with ischemic heart disease and previous CABG who has severe, stage D, mitral regurgitation with New York Heart Association functional class II symptoms of chronic systolic heart failure.  I have reviewed the natural history of chronic systolic heart failure with severe mitral regurgitation with the patient and his sister who is present today.  They understand limitations of medical therapy as well as surgical and interventional approaches.  The patient is clearly on a maximally tolerated medical regimen with close care in the advanced heart failure clinic by Dr. [name].  We reviewed the data comparing medical therapy to percutaneous mitral valve edge to edge repair with MitraClip today, with the Coapt Trial demonstrating reduction in hospitalization and improved mortality. They understand MitraClip is now FDA improved for secondary MR but awaiting review by CMS. I discussed the MitraClip procedural details with the patient today. We reviewed risks, indications, and alternative of palliative medical therapy. I personally reviewed his TEE images which demonstrate severe mitral regurgitation secondary to both annular dilatation and posterior leaflet restriction. The ERO is 0.55. The mitral valve has anatomic characteristics that are amenable to edge-to-edge repair. I advised the patient that our team would be in touch with him once the CMS review process is complete. At that time he will likely require formal surgical consultation with Dr Cyndia Bent and also need to undergo L/R heart catheterization for pre-procedural evaluation.   Current medicines are reviewed with the patient today.  The patient does not have concerns regarding medicines.  Labs/ tests ordered today include:  No  orders of the defined types were placed in this encounter.   Disposition:   FU as above  Signed, [name], MD  02/19/2018 6:27 AM    [facility] [address], [place], [place]  [address] Phone: [phone]; Fax: [phone]

## 2018-02-18 NOTE — Progress Notes (Signed)
Error

## 2018-03-02 ENCOUNTER — Other Ambulatory Visit (HOSPITAL_COMMUNITY): Payer: Self-pay

## 2018-03-02 ENCOUNTER — Other Ambulatory Visit (HOSPITAL_COMMUNITY): Payer: Self-pay | Admitting: Cardiology

## 2018-03-02 ENCOUNTER — Ambulatory Visit (INDEPENDENT_AMBULATORY_CARE_PROVIDER_SITE_OTHER): Payer: Medicare Other

## 2018-03-02 DIAGNOSIS — Z95810 Presence of automatic (implantable) cardiac defibrillator: Secondary | ICD-10-CM | POA: Diagnosis not present

## 2018-03-02 DIAGNOSIS — I5022 Chronic systolic (congestive) heart failure: Secondary | ICD-10-CM | POA: Diagnosis not present

## 2018-03-02 NOTE — Progress Notes (Signed)
Bp: 100/58, pulse: 60, resp: 20, spo2: 96% Weight today: 174.4 last weight: 178  Pt states feels good today.  He is becoming more active due to his leg is getting better.  He went to court and they ruled he could keep his license. Pt states still not taking the dog on walks, keeps walker close by just incase leg starts hurting. Walking more without walker in house.  Pt denies shortness of breath, headaches or dizziness.  Pt lungs are clear, he has no extremity swelling.  Abdomen does not feel tight.  Meds were verified and med box filled.  Called in refill for Losartan.  Will visit patient again in 2 weeks.  Pt states his diet has been good, pt is watching fluid intake. Will continue to monitor patient diet.  Pt unable to learn how to cook, he is supplied with hot meals by his sister, did consult with her about foods.  Pt unable to fill med boxes, filled 3 weeks today.    [place] [phone]

## 2018-03-02 NOTE — Progress Notes (Signed)
EPIC Encounter for ICM Monitoring  Patient Name: Carl [name] is a 67 y.o. male Date: 03/02/2018 Primary Care Physican: [name], MD Primary Cardiologist:[place] Electrophysiologist: Allred DryWeight:unsure of weight but weighs daily with [employer] scale.       Spoke with brother, [name].  Heart Failure questions reviewed, pt asymptomatic.  ER visit in June for fall.   Thoracic impedance normal.  Prescribed dosage: Furosemide80 mg1tablet (80 mg total)twice a day.Potassium 20 mEq 1 tablet twice a day.  Labs: 09/21/2017 Creatinine 1.70, BUN 28, Potassium 4.3, Sodium 133, EGFR 40-47 09/09/2017 Creatinine1.53, BUN35, Potassium4.4, Sodium134, [id number] 08/13/2017 Creatinine1.43, BUN35, Potassium4.2, Sodium131, EGFR50-57  06/19/2017 Creatinine1.60, BUN17, Potassium4.8, Sodium135, QHQI16-58  06/12/2017 Creatinine1.43, BUN23, Potassium3.4, Sodium136, EGFR50-58  06/10/2017 Creatinine1.38, BUN21, Potassium3.8, Sodium136, EGFR52->60  05/27/2017 Creatinine1.47, BUN15, Potassium3.4, [id number], [id number]  05/25/2017 Creatinine1.36, BUN15, Potassium2.8, Sodium138, EGFR53->60  04/28/2017 Creatinine1.97, BUN31, Potassium3.9, Sodium139, [id number] A complete set of results can be found in Results Review.  Recommendations: No changes.    Encouraged to call for fluid symptoms.  Follow-up plan: ICM clinic phone appointment on 04/05/2018.     Copy of ICM check sent to Dr. Rayann Heman.   3 month ICM trend: 03/02/2018    1 Year ICM trend:       Rosalene Billings, RN 03/02/2018 11:09 AM

## 2018-03-15 ENCOUNTER — Ambulatory Visit: Payer: Medicare Other | Admitting: Podiatry

## 2018-03-16 ENCOUNTER — Other Ambulatory Visit (HOSPITAL_COMMUNITY): Payer: Self-pay

## 2018-03-16 NOTE — Progress Notes (Signed)
Bp: 90/68, pulse: 72, resp: 18, spo 97%  todays weight 176 last weight 178  Today patient states feels good.      He denies chest pain,  Shortness of breath or dizziness.  Lungs clear, no swelling in extremities.  Abdomen does not feel tight.  Pt states been watching what he eats.  Pt still complains of pain in left left thigh area, he has seen orthopaedic for same.  He is walking better without walker, thinking about getting a cane.  He has been more active.  He states watching what he eats.  Weight is down.  Meds verified and med box filled.  Will continue to followup with [name].  [place] [phone]

## 2018-03-30 ENCOUNTER — Other Ambulatory Visit (HOSPITAL_COMMUNITY): Payer: Self-pay

## 2018-03-30 NOTE — Progress Notes (Signed)
Bp: 88/58, pulse: 70, resp: 18, spo2: 97 last weight: 176, todays: 174 lbs   Today Carl [name] appears to be good and he states he is good except for leg hurting.   He denies shortness of breath or chest pain.  He walks with walker.  He still drives and goes out and about when he wants.  He does not cook and son states he is unteachable.  His family will take him out to eat or sister brings him meals.  He states appetite been good, he states been watching sodium.  Meds verified and med box refilled.  Called in refill for Eplerenone.  No swelling in extremities, abdomen does not feel tight.  Lungs clear.  Pt denies headaches or dizziness.  Will continue to follow up and refill med boxes for [place].   [place] [phone]

## 2018-04-05 ENCOUNTER — Encounter: Payer: Self-pay | Admitting: Podiatry

## 2018-04-05 ENCOUNTER — Ambulatory Visit (INDEPENDENT_AMBULATORY_CARE_PROVIDER_SITE_OTHER): Payer: Medicare Other

## 2018-04-05 ENCOUNTER — Ambulatory Visit (INDEPENDENT_AMBULATORY_CARE_PROVIDER_SITE_OTHER): Payer: Medicare Other | Admitting: *Deleted

## 2018-04-05 ENCOUNTER — Ambulatory Visit (INDEPENDENT_AMBULATORY_CARE_PROVIDER_SITE_OTHER): Payer: Medicare Other | Admitting: Podiatry

## 2018-04-05 DIAGNOSIS — I255 Ischemic cardiomyopathy: Secondary | ICD-10-CM | POA: Diagnosis not present

## 2018-04-05 DIAGNOSIS — M79675 Pain in left toe(s): Secondary | ICD-10-CM | POA: Diagnosis not present

## 2018-04-05 DIAGNOSIS — I5022 Chronic systolic (congestive) heart failure: Secondary | ICD-10-CM | POA: Diagnosis not present

## 2018-04-05 DIAGNOSIS — B351 Tinea unguium: Secondary | ICD-10-CM | POA: Diagnosis not present

## 2018-04-05 DIAGNOSIS — M79674 Pain in right toe(s): Secondary | ICD-10-CM | POA: Diagnosis not present

## 2018-04-05 DIAGNOSIS — Z95810 Presence of automatic (implantable) cardiac defibrillator: Secondary | ICD-10-CM | POA: Diagnosis not present

## 2018-04-05 NOTE — Progress Notes (Signed)
Complaint:  Visit Type: Patient returns to my office for continued preventative foot care services. Complaint: Patient states" my nails have grown long and thick and become painful to walk and wear shoes"  The patient presents for preventative foot care services. No changes to ROS  Podiatric Exam: Vascular: dorsalis pedis and posterior tibial pulses are palpable bilateral. Capillary return is immediate. Temperature gradient is WNL. Skin turgor WNL  Sensorium: Normal Semmes Weinstein monofilament test. Normal tactile sensation bilaterally. Nail Exam: Pt has thick disfigured discolored nails with subungual debris noted bilateral entire nail hallux through fifth toenails Ulcer Exam: There is no evidence of ulcer or pre-ulcerative changes or infection. Orthopedic Exam: Muscle tone and strength are WNL. No limitations in general ROM. No crepitus or effusions noted. Foot type and digits show no abnormalities. Bony prominences are unremarkable.! And 1/2 inch limb leg discrepancy. Skin: No Porokeratosis. No infection or ulcers  Diagnosis:  Onychomycosis, , Pain in right toe, pain in left toes  Treatment & Plan Procedures and Treatment: Consent by patient was obtained for treatment procedures.   Debridement of mycotic and hypertrophic toenails, 1 through 5 bilateral and clearing of subungual debris. No ulceration, no infection noted.  Return Visit-Office Procedure: Patient instructed to return to the office for a follow up visit 3 months for continued evaluation and treatment.    Gardiner Barefoot DPM

## 2018-04-05 NOTE — Progress Notes (Signed)
Remote ICD transmission.   

## 2018-04-05 NOTE — Progress Notes (Signed)
EPIC Encounter for ICM Monitoring  Patient Name: Carl [name] is a 67 y.o. male Date: 04/05/2018 Primary Care Physican: [name], MD Primary Cardiologist:[place] Electrophysiologist: Allred DryWeight:unsure of weight but weighs daily with UHC scale.                                                  Spoke withbrother, [name]. Heart Failure questions reviewed, pt asymptomatic.  He said patient has been eating some restaurant foods.  He said [place] with [employer] program continues to work with patient and has not reported patient having any fluid symptoms.    Thoracic impedance abnormal suggesting fluid accumulation starting 03/07/2018.    Prescribed dosage: Furosemide80 mg1tablet (80 mg total)twice a day.Potassium 20 mEq 1 tablet twice a day.  Labs: 09/21/2017 Creatinine 1.70, BUN 28, Potassium 4.3, Sodium 133, EGFR 40-47 09/09/2017 Creatinine1.53, BUN35, Potassium4.4, Sodium134, [id number] 08/13/2017 Creatinine1.43, BUN35, Potassium4.2, Sodium131, EGFR50-57  06/19/2017 Creatinine1.60, BUN17, Potassium4.8, Sodium135, [id number]  06/12/2017 Creatinine1.43, BUN23, Potassium3.4, Sodium136, EGFR50-58  06/10/2017 Creatinine1.38, BUN21, Potassium3.8, Sodium136, EGFR52->60  05/27/2017 Creatinine1.47, BUN15, Potassium3.4, [id number], [id number]  05/25/2017 Creatinine1.36, BUN15, Potassium2.8, Sodium138, EGFR53->60  04/28/2017 Creatinine1.97, BUN31, Potassium3.9, Sodium139, EXOG00-29 A complete set of results can be found in Results Review.  Recommendations:  Advised to limit salt to 2000 mg and fluid to 64 oz unless patient is outside in the heat then will need to increase fluids.  Advised restaurant foods are typically very high in salt.   Follow-up plan: ICM clinic phone appointment on 04/12/2018 (Manual send) to recheck fluid levels.     Copy of ICM check sent to Dr. Rayann Heman and Dr [name] for review and will call back with any  recommendations.    3 month ICM trend: 03/05/2018    1 Year ICM trend:       Rosalene Billings, RN 04/05/2018 9:37 AM

## 2018-04-06 ENCOUNTER — Other Ambulatory Visit (HOSPITAL_COMMUNITY): Payer: Self-pay

## 2018-04-06 ENCOUNTER — Encounter: Payer: Self-pay | Admitting: Cardiology

## 2018-04-06 ENCOUNTER — Telehealth (HOSPITAL_COMMUNITY): Payer: Self-pay | Admitting: *Deleted

## 2018-04-06 MED ORDER — METOLAZONE 2.5 MG PO TABS: 2.5000 mg | ORAL_TABLET | ORAL | 0 refills | Status: DC

## 2018-04-06 NOTE — Progress Notes (Signed)
Bp: 92/60, pulse: 64, resp: 18, spo2: 98%, last weight: 174, todays weight: 181 lbs   Pt has no swelling in legs, has some in ankle areas.  Pt has compression hose on.  He states has short of breath sometimes but not often.  He states right now it is good.  Pt in last month has gained approx 10 pounds looking at trend.  Pt does not know how to cook, working on trying to get him to go to a church and eat.  He eats fast food or just a boiled egg.  His brother states he goes gets ham biscuits but patient states he does not.  Contacted Carl [name] at [place] [facility] to advise Carl [name] to see if he wants a med change and to update him on patient. Carl [name] advised she will get with Carl [name] and call me back.  Lungs are clear.  Pt denies headaches or dizziness.  He stays active.  Discuss diet with him, discussed healthy foods and not so healthy foods.  Will continue to work with Carl [name].  Carl [name] did call back and advised Metolazone 2.5 mg for Wednesday and Friday only and to add an extra potassium for those days.  Spoke with Carl [name] brother and he will advise Carl [name] to pick up medicine and hold it till I get there around 11 am tomorrow. I will go and change med box.   [place] [phone]

## 2018-04-06 NOTE — Telephone Encounter (Signed)
Carl [name] is aware and agreeable, she will advise pt and have him p/u med from [facility], rx sent in, she request we call pt's brother [name] to schedule his f/u appt, message sent to schedulers.

## 2018-04-06 NOTE — Telephone Encounter (Signed)
Take metolazone 2.5 mg with am Lasix Wednesday and again Friday.  Take extra 20 mEq KCl with metolazone.  Needs followup in clinic for medication adjustment, suspect we need to switch him over to torsemide.

## 2018-04-06 NOTE — Telephone Encounter (Signed)
Kristi called to report pt has gained 10 lbs over past month and does have edema in ankles and occ has SOB.  Pt's optivol report does show increase since July 8th.  Pt is taking lasix 80 mg BID and Kristi states he does not miss any doses of meds, however his diet is bad and she continues to work on that with him.  Will send to Dr [name] for review and recommendations, will call Kristi back.

## 2018-04-07 ENCOUNTER — Other Ambulatory Visit (HOSPITAL_COMMUNITY): Payer: Self-pay

## 2018-04-07 ENCOUNTER — Encounter (HOSPITAL_COMMUNITY): Payer: Self-pay

## 2018-04-07 NOTE — Progress Notes (Signed)
Bp: 90/60, pulse: 64, resp: 18, spo2: 96% last weight: 181, todays: 180 lbs  Today came by to visit with [name] to place his metolazone in his med box and extra potassium.  Placed 1 each in todays and 1each in Friday.  Advised him to leave the extra in the bottle and do not take.  Advised him he may urinate more today and to drink appropriate amount today.  If he feels real dizzy or feels like he is going to pass out to call me or either cardiology department.  He expressed he understood.  Will visit with him next week.   [place] [phone]

## 2018-04-08 ENCOUNTER — Ambulatory Visit: Payer: Medicare Other | Admitting: Podiatry

## 2018-04-08 NOTE — Progress Notes (Signed)
We have adjusted meds.

## 2018-04-12 ENCOUNTER — Telehealth: Payer: Self-pay

## 2018-04-12 NOTE — Progress Notes (Signed)
Patient ID: Carl [name], male   DOB: 08/19/1951, 67 y.o.   MRN: [id number] PCP: Dr. Ginette Pitman Cardiology: Dr. [name]  Carl [name] is a 67 yo with history of smoking and mild mental retardation was admitted to [facility] in 12/15 with dyspnea.  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  LHC showed 3 vessel disease with EF 15%.  Echo showed EF 15-20%.  Patient had CABG x 5.  It was difficult to wean him off pressors post-op.  He ended up having to start midodrine but was later weaned off.   Admitted 10/17 with volume overload and AKI. Diuresed with IV lasix and transitiioned to 40 mg lasix daily. Spiro, dig, lisinopril stopped due to elevated creatinine 2.28. Discharge weight 163 pounds.    Echo in 2/18 showed EF 30-35%, mild LV dilation, rWMAs, moderate Carl, mild to moderately decreased RV systolic function. Cardiolite in 2/18 showed large inferoseptal/inferior/inferolateral infarction with no ischemia.   Admitted to [facility] 06/10/17-06/12/17 with acute on chronic systolic CHF. Diuresed 5 pounds with IV Lasix. Discharge weight was 175 pounds.  Echo in 10/18 showed EF 20-25% with severe Carl, possibly ischemic Carl.   Today he returns for HF follow up. He is followed by [facility]. Overall feeling fine. Denies SOB/PND/Orthopnea. Increase leg edema. Appetite ok. Eating high sodium diet with fast food and ham biscuits. He has also been eating potato chips.  No fever or chills. Has been weihgin Weight at home has been going up 174-181 pounds. Taking all medications. Followed by [facility] and he has medications placed in a weekly pill box. Lives alone.   Labs (12/15): K 4.1, creatinine 0.81, hgb 9.1 Labs (09/12/2014): K 3.7 Creatinine 0.96, digoxin 0.7 Labs (3/16): digoxin 0.8, LDL 62, HDL 40 Labs (4/16): K 4.3, creatinine 1.1 Labs (5/16) K 4.6, creatinine 1.24, digoxin 1.0 Labs (05/2016): K 3.6 Creatinine 1.47  => 1.37 Labs (11/17): LDL 61, HDL 41, K 4.2, creaitnine 1.1 Labs  (2/18): K 3.7 => 5.3, creatinine 1.84 => 1.35, BNP 924 Labs (3/18): K 4.8, creatinine 1.33, BNP 552 Labs (9/18): K 3.9, creatinine 1.97 Labs (08/03/2018): K 4.2 Creatinine 1.6 Labs ( 09/09/2017): K 4.4 Creatinine 1.53 Labs (3/19): K 4.5, creatinine 1.5 Labs (02/02/18): K 3.9 Creatinine 1.8   Optivol: Well above threshold. No VT/AF ~4 hours activity   PMH: 1. Smoker 2. Mild mental retardation 3. CAD: LHC (12/15) with 3 vessel disease.  CABG x 5 in 12/15 with LIMA-LAD, SVG-D1, sequential SVG-OM2/OM3, SVG-PDA.   - Cardiolite (2/18):  Large inferoseptal/inferior/inferolateral infarction with no ischemia, EF 29%.  4. Ischemic cardiomyopathy: Echo (12/15) with EF 15-20%, wall motion abnormalities, mildly decreased RV systolic function, mild Carl.  Echo (3/16) with EF 30-35%, severe LV dilation, moderate MR, PA systolic pressure 42 mmHg. Echo (8/16) with EF 25-30%, severely dilated LV, diffuse hypokinesis with inferior akinesis, restrictive diastolic function, RV mildly dilated with mildly decreased systolic function, moderate Carl.  - Echo (10/17): EF 20-25%, moderate Carl.  - Medtronic ICD.  - ACEI cough. Gynecomastia with spironolacon - Echo (2/18): EF 30-35%, mild LV dilation, regional WMAs, moderate diastolic dysfunction, normal RV size with mild to moderately decreased systolic function, moderate Carl (likely infarct-related).  - Echo (10/18): EF 20-25%, severe Carl (likely ischemic).  5. Depression 6. Vitiligo 7. Mitral regurgitation: Moderate on 2/18, likely infarct-related.  - Severe on 10/18 echo, likely infarct-related. 8. CKD stage III.  9. Melena- 08/24/2018 EGD/Colonoscopy polyp and gastritis.   SH: Lives  alone, quit smoking in 12/15.  No ETOH.   FH: Brother and father with MIs.    ROS: All systems reviewed and negative except as per HPI.   Current Outpatient Medications  Medication Sig Dispense Refill  . acetaminophen (TYLENOL) 325 MG tablet Take 325 mg by mouth every 6 (six) hours  as needed.    Marland Kitchen aspirin 81 MG EC tablet Take 81 mg by mouth daily.  11  . atorvastatin (LIPITOR) 40 MG tablet Take 1 tablet (40 mg total) by mouth daily at 6 PM. 30 tablet 1  . carvedilol (COREG) 3.125 MG tablet Take 3.125 mg by mouth 2 (two) times daily with a meal.    . eplerenone (INSPRA) 25 MG tablet Take 1 tablet (25 mg total) by mouth daily. 90 tablet 3  . furosemide (LASIX) 80 MG tablet TAKE 1 TABLET (80 MG TOTAL) BY MOUTH 2 (TWO) TIMES DAILY. 60 tablet 4  . KLOR-CON M20 20 MEQ tablet TAKE 1 TABLET (20 MEQ TOTAL) 2 (TWO) TIMES DAILY BY MOUTH. 60 tablet 2  . losartan (COZAAR) 25 MG tablet Take 0.5 tablets (12.5 mg total) daily by mouth.    . citalopram (CELEXA) 20 MG tablet Take 20 mg by mouth daily.   5  . cyanocobalamin 500 MCG tablet Take 500 mcg by mouth daily.    . cyclobenzaprine (FLEXERIL) 10 MG tablet Take 10 mg by mouth as needed.  0  . ferrous sulfate 325 (65 FE) MG tablet Take 1 tablet (325 mg total) by mouth daily with breakfast. 30 tablet 3  . ibuprofen (ADVIL,MOTRIN) 600 MG tablet Take 1 tablet (600 mg total) by mouth every 8 (eight) hours as needed. (Patient not taking: Reported on 03/02/2018) 30 tablet 0  . metolazone (ZAROXOLYN) 2.5 MG tablet Take 1 tablet (2.5 mg total) by mouth as directed. (Patient not taking: Reported on 04/13/2018) 5 tablet 0  . omeprazole (PRILOSEC) 20 MG capsule Take 20 mg by mouth 2 (two) times daily.     No current facility-administered medications for this encounter.    BP (!) 98/56   Pulse 77   Wt 82.5 kg (181 lb 12.8 oz)   SpO2 91%   BMI 24.66 kg/m   Filed Weights   04/13/18 1030  Weight: 82.5 kg (181 lb 12.8 oz)      Wt Readings from Last 3 Encounters:  04/13/18 82.5 kg (181 lb 12.8 oz)  04/07/18 81.6 kg (180 lb)  04/06/18 82.1 kg (181 lb)    General:  Well appearing. No resp difficulty HEENT: normal Neck: supple. JVP ~10  Carotids 2+ bilat; no bruits. No lymphadenopathy or thryomegaly appreciated. Cor: PMI nondisplaced. Regular  rate & rhythm. No rubs, gallops or murmurs. 3/6 HSM apex.  Lungs: clear Abdomen: soft, nontender, nondistended. No hepatosplenomegaly. No bruits or masses. Good bowel sounds. Extremities: no cyanosis, clubbing, rash, R and LLE 1+ edema Neuro: alert & orientedx3, cranial nerves grossly intact. moves all 4 extremities w/o difficulty. Affect pleasant  Assessment/Plan: 1. CAD: status post CABG.  Cardiolite in 2/18 showed infarction, no ischemia.  No chest pain.  - Continue ASA and statin.   2. Chronic systolic CHF: Ischemic CMP.  Echo (10/18) with EF 20-25%.  Has Medtronic ICD.   NYHA II- IIIb. Volume status elevated in the setting of high sodium diet. Overloaded on optivol.  Continue lasix 80 mg twice a day and he will metolazone for next 2 days. - Continue Coreg 3.125 mg bid. - Continue current eplerenone and losartan.  3. Depression: Continue Celexa 4. Mitral regurgitation: Severe, probably infarct-related.  He may be a good Mitraclip candidate.   - He has been seen by Dr Burt Knack and he is a candidate for mitral clip but we are waiting on FDA approval.  5. CKD Stage III: Creatinine baseline 1.3-1.5  Check BMET today.  6. H/O GI bleed: Recent EGD/Colonoscopy with polyp noted and gastritis. No overt bleeding currently.    Follow up in 3 weeks. Check BMET and BNP. Discussed low salt food choices. We will contact Kristi with [facility] regarding the above changes.  Gretta Samons NP-C  04/13/2018

## 2018-04-12 NOTE — Telephone Encounter (Signed)
Confirmed remote transmission w/ pt brother.

## 2018-04-13 ENCOUNTER — Ambulatory Visit (HOSPITAL_COMMUNITY)
Admission: RE | Admit: 2018-04-13 | Discharge: 2018-04-13 | Disposition: A | Discharge status: Home / Self Care | Payer: Medicare Other | Source: Ambulatory Visit | Attending: Internal Medicine | Admitting: Internal Medicine

## 2018-04-13 ENCOUNTER — Encounter (HOSPITAL_COMMUNITY): Payer: Self-pay

## 2018-04-13 ENCOUNTER — Other Ambulatory Visit (HOSPITAL_COMMUNITY): Payer: Self-pay

## 2018-04-13 VITALS — BP 98/56 | HR 77 | Wt 181.8 lb

## 2018-04-13 DIAGNOSIS — Z95810 Presence of automatic (implantable) cardiac defibrillator: Secondary | ICD-10-CM | POA: Diagnosis not present

## 2018-04-13 DIAGNOSIS — I5022 Chronic systolic (congestive) heart failure: Secondary | ICD-10-CM

## 2018-04-13 DIAGNOSIS — F70 Mild intellectual disabilities: Secondary | ICD-10-CM | POA: Insufficient documentation

## 2018-04-13 DIAGNOSIS — Z951 Presence of aortocoronary bypass graft: Secondary | ICD-10-CM | POA: Diagnosis not present

## 2018-04-13 DIAGNOSIS — Z7982 Long term (current) use of aspirin: Secondary | ICD-10-CM | POA: Diagnosis not present

## 2018-04-13 DIAGNOSIS — Z87891 Personal history of nicotine dependence: Secondary | ICD-10-CM | POA: Diagnosis not present

## 2018-04-13 DIAGNOSIS — Z8249 Family history of ischemic heart disease and other diseases of the circulatory system: Secondary | ICD-10-CM | POA: Insufficient documentation

## 2018-04-13 DIAGNOSIS — N183 Chronic kidney disease, stage 3 (moderate): Secondary | ICD-10-CM | POA: Insufficient documentation

## 2018-04-13 DIAGNOSIS — I2510 Atherosclerotic heart disease of native coronary artery without angina pectoris: Secondary | ICD-10-CM | POA: Diagnosis not present

## 2018-04-13 DIAGNOSIS — F329 Major depressive disorder, single episode, unspecified: Secondary | ICD-10-CM | POA: Insufficient documentation

## 2018-04-13 DIAGNOSIS — I509 Heart failure, unspecified: Secondary | ICD-10-CM | POA: Diagnosis present

## 2018-04-13 DIAGNOSIS — Z79899 Other long term (current) drug therapy: Secondary | ICD-10-CM | POA: Insufficient documentation

## 2018-04-13 DIAGNOSIS — I340 Nonrheumatic mitral (valve) insufficiency: Secondary | ICD-10-CM | POA: Diagnosis not present

## 2018-04-13 DIAGNOSIS — I519 Heart disease, unspecified: Secondary | ICD-10-CM

## 2018-04-13 DIAGNOSIS — Z86010 Personal history of colonic polyps: Secondary | ICD-10-CM | POA: Insufficient documentation

## 2018-04-13 DIAGNOSIS — I255 Ischemic cardiomyopathy: Secondary | ICD-10-CM | POA: Diagnosis not present

## 2018-04-13 LAB — BASIC METABOLIC PANEL
ANION GAP: 12 (ref 5–15)
BUN: 23 mg/dL (ref 8–23)
CO2: 29 mmol/L (ref 22–32)
Calcium: 9.3 mg/dL (ref 8.9–10.3)
Chloride: 97 mmol/L — ABNORMAL LOW (ref 98–111)
Creatinine, Ser: 1.64 mg/dL — ABNORMAL HIGH (ref 0.61–1.24)
GFR calc Af Amer: 49 mL/min — ABNORMAL LOW (ref >60)
GFR calc non Af Amer: 42 mL/min — ABNORMAL LOW (ref >60)
GLUCOSE: 112 mg/dL — AB (ref 70–99)
POTASSIUM: 3.7 mmol/L (ref 3.5–5.1)
Sodium: 138 mmol/L (ref 135–145)

## 2018-04-13 LAB — BRAIN NATRIURETIC PEPTIDE: B Natriuretic Peptide: 681.4 pg/mL — ABNORMAL HIGH (ref 0.0–100.0)

## 2018-04-13 NOTE — Progress Notes (Signed)
Today received a call from HF clinic in [place] stating Donavyn needs to take Metolazone today and tomorrow and extra potassium a day.  Lemuel seen cardiology today due to weigh gain in past month.  Per notes continue all meds except for above. Placed Metolazone and Potassium in Banyan's med box for today and tomorrow.  Will continue to visit Isaiyah weekly to watch weight. Meals on wheels was ordered this morning and discussed this with Mathias to get him eating better than snacking.  Continue to educate Siddhanth on bad foods and better eating habits. Ruben does not know how to cook except a boiled egg.  Eagan states he is watching his fluids.    [place] [phone]

## 2018-04-13 NOTE — Patient Instructions (Signed)
Labs today (will call for abnormal results, otherwise no news is good news)  TAKE metolazone 2.5 mg with 20 mEq of potassium today and tomorrow only.    Follow up as scheduled in 3 weeks.

## 2018-04-15 ENCOUNTER — Other Ambulatory Visit (HOSPITAL_COMMUNITY): Payer: Self-pay

## 2018-04-15 NOTE — Progress Notes (Signed)
Bp: 90/60, pulse: 68, resp: 16, spo2: 96, last weight: 180 lbs, today: 175 lbs   Did not pick up eplerenone.  [name] his brother will pick up and place in med boxes.  By Laymond Purser med box he missed 2 days of his afternoon and night meds.  He states he did not. They were left in med box.  He states feeling good.  He does not eat much, he states he eats an egg every morning.  Have ordered meals on wheels.  Will continue to educate Ivo on diet, low sodium foods and to eat regularly during the day.  He stays active, but not as active as he was a month ago due to his leg hurting.  No swelling in extremlites, lungs clear.  He denies headaches, dizziness or shortness of breath.  Will continue to visit weekly to monitor weight.   [place] [phone]

## 2018-04-19 NOTE — Progress Notes (Unsigned)
No ICM remote transmission received for 04/12/2018 and next ICM transmission scheduled for 04/20/2018.

## 2018-04-20 ENCOUNTER — Telehealth: Payer: Self-pay

## 2018-04-20 NOTE — Telephone Encounter (Signed)
Spoke with brother and requested pt send remote transmission today to recheck fluid levels.

## 2018-04-21 NOTE — Progress Notes (Unsigned)
No ICM remote transmission received for 04/20/2018 and next ICM transmission scheduled for 05/20/2018.

## 2018-04-22 ENCOUNTER — Encounter (HOSPITAL_COMMUNITY): Payer: Self-pay

## 2018-04-22 ENCOUNTER — Other Ambulatory Visit (HOSPITAL_COMMUNITY): Payer: Self-pay

## 2018-04-22 NOTE — Progress Notes (Signed)
Last weight: 175 lbs  Todays weight: 175 lbs  Bp: 88/58, pulse: 62, resp: 18, spo2: 97  Today came by to check on Carl [name] to see how he was feeling since he took metolazone last week.  Walked in and it was very hot and humid in house.  Carl [name] states he thought it was hot, looked at thermostat and battery is dead and it shuts system off.  Contacted his brother and he will come fix it.  Carl [name] states been feeling good.  He states his weight has been down. Denies shortness of breath.  No edema in extremities, abdomen does not feel tight.  He denies chest pain, dizziness or headaches. Lung sounds are clear.  Meds verified, he has been taking them.  Will continue to visit Mong weekly and talk to him about diet.   [place] [phone]

## 2018-04-27 ENCOUNTER — Other Ambulatory Visit (HOSPITAL_COMMUNITY): Payer: Self-pay

## 2018-04-27 ENCOUNTER — Encounter (HOSPITAL_COMMUNITY): Payer: Self-pay

## 2018-04-27 NOTE — Progress Notes (Signed)
Bp: 80/60, pulse: 65, spo2: 97, last weight: 175, todays weight: 175 lbs  Carl [name] states other than his leg hurting been ok.  He denies breathing difficulty, no chest pain.  Denies headaches or dizziness.  Keep educating Carl [name] on low salt but important to eat also.  All he has ate today was a tomato.  Yesterday he ate hard boiled egg.  Have contacted meals on wheels and they have not started yet.  Have advised Carl [name] of church's that serve hot meals throughout the week, he drives and states he will check them out.  He has some food in his house but since it has salt in it he chooses not to eat nothing. Explained to him about he needs to eat.  Carl [name] states not drank but maybe 12 ounces of water today, he states has not urinated since 5:30 am.  Carl [name] at [facility] and she advised to get him to drink some more today and skip his lasix tonight.  Advised him on his glass he needs to drink atleast 3 to 4 a day.  He appears to understand.  Lungs are clear. Took his lasix out of his med box, verified meds and med boxes filled. Will check on meals on wheels to see if I can get update. Will continue to visit [name] weekly.   [place] [phone]

## 2018-04-30 LAB — CUP PACEART REMOTE DEVICE CHECK
Brady Statistic RV Percent Paced: 0.02 %
Date Time Interrogation Session: 20190812082304
HIGH POWER IMPEDANCE MEASURED VALUE: 65 Ohm
Implantable Lead Implant Date: 20160906
Lead Channel Impedance Value: 342 Ohm
Lead Channel Impedance Value: 399 Ohm
Lead Channel Pacing Threshold Amplitude: 0.75 V
Lead Channel Sensing Intrinsic Amplitude: 11.375 mV
Lead Channel Sensing Intrinsic Amplitude: 11.375 mV
Lead Channel Setting Sensing Sensitivity: 0.3 mV
MDC IDC LEAD LOCATION: 753860
MDC IDC MSMT BATTERY REMAINING LONGEVITY: 118 mo
MDC IDC MSMT BATTERY VOLTAGE: 3.03 V
MDC IDC MSMT LEADCHNL RV PACING THRESHOLD PULSEWIDTH: 0.4 ms
MDC IDC PG IMPLANT DT: 20160906
MDC IDC SET LEADCHNL RV PACING AMPLITUDE: 2.5 V
MDC IDC SET LEADCHNL RV PACING PULSEWIDTH: 0.4 ms

## 2018-05-04 ENCOUNTER — Encounter (HOSPITAL_COMMUNITY): Payer: Self-pay

## 2018-05-04 ENCOUNTER — Ambulatory Visit (HOSPITAL_COMMUNITY)
Admission: RE | Admit: 2018-05-04 | Discharge: 2018-05-04 | Disposition: A | Discharge status: Home / Self Care | Payer: Medicare Other | Source: Ambulatory Visit | Attending: Cardiology | Admitting: Cardiology

## 2018-05-04 VITALS — BP 92/70 | HR 71 | Wt 185.6 lb

## 2018-05-04 DIAGNOSIS — Z87891 Personal history of nicotine dependence: Secondary | ICD-10-CM | POA: Diagnosis not present

## 2018-05-04 DIAGNOSIS — F70 Mild intellectual disabilities: Secondary | ICD-10-CM | POA: Diagnosis not present

## 2018-05-04 DIAGNOSIS — Z7982 Long term (current) use of aspirin: Secondary | ICD-10-CM | POA: Insufficient documentation

## 2018-05-04 DIAGNOSIS — Z79899 Other long term (current) drug therapy: Secondary | ICD-10-CM | POA: Diagnosis not present

## 2018-05-04 DIAGNOSIS — I5022 Chronic systolic (congestive) heart failure: Secondary | ICD-10-CM | POA: Insufficient documentation

## 2018-05-04 DIAGNOSIS — F329 Major depressive disorder, single episode, unspecified: Secondary | ICD-10-CM | POA: Insufficient documentation

## 2018-05-04 DIAGNOSIS — I2510 Atherosclerotic heart disease of native coronary artery without angina pectoris: Secondary | ICD-10-CM | POA: Diagnosis not present

## 2018-05-04 DIAGNOSIS — Z95810 Presence of automatic (implantable) cardiac defibrillator: Secondary | ICD-10-CM

## 2018-05-04 DIAGNOSIS — I340 Nonrheumatic mitral (valve) insufficiency: Secondary | ICD-10-CM | POA: Diagnosis not present

## 2018-05-04 DIAGNOSIS — N182 Chronic kidney disease, stage 2 (mild): Secondary | ICD-10-CM | POA: Diagnosis not present

## 2018-05-04 DIAGNOSIS — I255 Ischemic cardiomyopathy: Secondary | ICD-10-CM | POA: Insufficient documentation

## 2018-05-04 DIAGNOSIS — Z951 Presence of aortocoronary bypass graft: Secondary | ICD-10-CM | POA: Diagnosis not present

## 2018-05-04 DIAGNOSIS — N183 Chronic kidney disease, stage 3 (moderate): Secondary | ICD-10-CM | POA: Diagnosis not present

## 2018-05-04 LAB — BASIC METABOLIC PANEL
Anion gap: 8 (ref 5–15)
BUN: 20 mg/dL (ref 8–23)
CALCIUM: 8.8 mg/dL — AB (ref 8.9–10.3)
CO2: 26 mmol/L (ref 22–32)
CREATININE: 1.48 mg/dL — AB (ref 0.61–1.24)
Chloride: 102 mmol/L (ref 98–111)
GFR calc Af Amer: 55 mL/min — ABNORMAL LOW (ref >60)
GFR calc non Af Amer: 48 mL/min — ABNORMAL LOW (ref >60)
GLUCOSE: 110 mg/dL — AB (ref 70–99)
Potassium: 3.6 mmol/L (ref 3.5–5.1)
Sodium: 136 mmol/L (ref 135–145)

## 2018-05-04 LAB — BRAIN NATRIURETIC PEPTIDE: B Natriuretic Peptide: 702.8 pg/mL — ABNORMAL HIGH (ref 0.0–100.0)

## 2018-05-04 MED ORDER — POTASSIUM CHLORIDE CRYS ER 20 MEQ PO TBCR: 20.0000 meq | EXTENDED_RELEASE_TABLET | Freq: Two times a day (BID) | ORAL | 2 refills | Status: DC

## 2018-05-04 MED ORDER — METOLAZONE 2.5 MG PO TABS: 2.5000 mg | ORAL_TABLET | ORAL | 11 refills | Status: DC

## 2018-05-04 NOTE — Patient Instructions (Signed)
Routine lab work today. Will notify you of abnormal results, otherwise no news is good news!  Take metolazone once every Wednesday morning with 1 EXTRA Potassium tablet.  Follow up 4 weeks.  _______________________________________________________________________ Carl [name] Code: 4580  Take all medication as prescribed the day of your appointment. Bring all medications with you to your appointment.  Do the following things EVERYDAY: 1) Weigh yourself in the morning before breakfast. Write it down and keep it in a log. 2) Take your medicines as prescribed 3) Eat low salt foods-Limit salt (sodium) to 2000 mg per day.  4) Stay as active as you can everyday 5) Limit all fluids for the day to less than 2 liters

## 2018-05-04 NOTE — Progress Notes (Signed)
Patient ID: Carl [name], male   DOB: 1951-03-01, 67 y.o.   MRN: [id number] PCP: Dr. Ginette Pitman Cardiology: Dr. [name]  Mr Stith is a 67 yo with history of smoking and mild mental retardation was admitted to [facility] in 12/15 with dyspnea.  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  LHC showed 3 vessel disease with EF 15%.  Echo showed EF 15-20%.  Patient had CABG x 5.  It was difficult to wean him off pressors post-op.  He ended up having to start midodrine but was later weaned off.   Admitted 10/17 with volume overload and AKI. Diuresed with IV lasix and transitiioned to 40 mg lasix daily. Spiro, dig, lisinopril stopped due to elevated creatinine 2.28. Discharge weight 163 pounds.    Echo in 2/18 showed EF 30-35%, mild LV dilation, rWMAs, moderate MR, mild to moderately decreased RV systolic function. Cardiolite in 2/18 showed large inferoseptal/inferior/inferolateral infarction with no ischemia.   Admitted to [facility] 06/10/17-06/12/17 with acute on chronic systolic CHF. Diuresed 5 pounds with IV Lasix. Discharge weight was 175 pounds.  Echo in 10/18 showed EF 20-25% with severe MR, possibly ischemic MR.   Today he returns for HF follow up. Last visit he was volume overloaded and was instructed to take metolazone for 2 days. Overall feeling fine. Denies SOB/PND/Orthopnea. Appetite ok. Limited options for low salt salt due to limited income.  No fever or chills. Weight at home 175  pounds. Taking all medications. Followed by [facility]. He is able to drive and pick up take out.  Lives alone.   Labs (12/15): K 4.1, creatinine 0.81, hgb 9.1 Labs (09/12/2014): K 3.7 Creatinine 0.96, digoxin 0.7 Labs (3/16): digoxin 0.8, LDL 62, HDL 40 Labs (4/16): K 4.3, creatinine 1.1 Labs (5/16) K 4.6, creatinine 1.24, digoxin 1.0 Labs (05/2016): K 3.6 Creatinine 1.47  => 1.37 Labs (11/17): LDL 61, HDL 41, K 4.2, creaitnine 1.1 Labs (2/18): K 3.7 => 5.3, creatinine 1.84 => 1.35, BNP 924 Labs (3/18): K 4.8,  creatinine 1.33, BNP 552 Labs (9/18): K 3.9, creatinine 1.97 Labs (08/03/2018): K 4.2 Creatinine 1.6 Labs ( 09/09/2017): K 4.4 Creatinine 1.53 Labs (3/19): K 4.5, creatinine 1.5 Labs (02/02/18): K 3.9 Creatinine 1.8   Optivol:   PMH: 1. Smoker 2. Mild mental retardation 3. CAD: LHC (12/15) with 3 vessel disease.  CABG x 5 in 12/15 with LIMA-LAD, SVG-D1, sequential SVG-OM2/OM3, SVG-PDA.   - Cardiolite (2/18):  Large inferoseptal/inferior/inferolateral infarction with no ischemia, EF 29%.  4. Ischemic cardiomyopathy: Echo (12/15) with EF 15-20%, wall motion abnormalities, mildly decreased RV systolic function, mild MR.  Echo (3/16) with EF 30-35%, severe LV dilation, moderate MR, PA systolic pressure 42 mmHg. Echo (8/16) with EF 25-30%, severely dilated LV, diffuse hypokinesis with inferior akinesis, restrictive diastolic function, RV mildly dilated with mildly decreased systolic function, moderate MR.  - Echo (10/17): EF 20-25%, moderate MR.  - Medtronic ICD.  - ACEI cough. Gynecomastia with spironolacon - Echo (2/18): EF 30-35%, mild LV dilation, regional WMAs, moderate diastolic dysfunction, normal RV size with mild to moderately decreased systolic function, moderate MR (likely infarct-related).  - Echo (10/18): EF 20-25%, severe MR (likely ischemic).  5. Depression 6. Vitiligo 7. Mitral regurgitation: Moderate on 2/18, likely infarct-related.  - Severe on 10/18 echo, likely infarct-related. 8. CKD stage III.  9. Melena- 08/24/2018 EGD/Colonoscopy polyp and gastritis.   SH: Lives alone, quit smoking in 12/15.  No ETOH.   FH: Brother and father with MIs.  ROS: All systems reviewed and negative except as per HPI.   Current Outpatient Medications  Medication Sig Dispense Refill  . acetaminophen (TYLENOL) 325 MG tablet Take 325 mg by mouth every 6 (six) hours as needed.    Marland Kitchen aspirin 81 MG EC tablet Take 81 mg by mouth daily.  11  . atorvastatin (LIPITOR) 40 MG tablet Take 1 tablet  (40 mg total) by mouth daily at 6 PM. 30 tablet 1  . carvedilol (COREG) 3.125 MG tablet Take 3.125 mg by mouth 2 (two) times daily with a meal.    . citalopram (CELEXA) 20 MG tablet Take 20 mg by mouth daily.   5  . cyanocobalamin 500 MCG tablet Take 500 mcg by mouth daily.    . cyclobenzaprine (FLEXERIL) 10 MG tablet Take 10 mg by mouth as needed.  0  . eplerenone (INSPRA) 25 MG tablet Take 1 tablet (25 mg total) by mouth daily. 90 tablet 3  . ferrous sulfate 325 (65 FE) MG tablet Take 1 tablet (325 mg total) by mouth daily with breakfast. 30 tablet 3  . furosemide (LASIX) 80 MG tablet TAKE 1 TABLET (80 MG TOTAL) BY MOUTH 2 (TWO) TIMES DAILY. 60 tablet 4  . ibuprofen (ADVIL,MOTRIN) 600 MG tablet Take 1 tablet (600 mg total) by mouth every 8 (eight) hours as needed. 30 tablet 0  . KLOR-CON M20 20 MEQ tablet TAKE 1 TABLET (20 MEQ TOTAL) 2 (TWO) TIMES DAILY BY MOUTH. 60 tablet 2  . losartan (COZAAR) 25 MG tablet Take 0.5 tablets (12.5 mg total) daily by mouth.    . metolazone (ZAROXOLYN) 2.5 MG tablet Take 1 tablet (2.5 mg total) by mouth as directed. 5 tablet 0  . omeprazole (PRILOSEC) 20 MG capsule Take 20 mg by mouth 2 (two) times daily.     No current facility-administered medications for this encounter.    BP 92/70   Pulse 71   Wt 84.2 kg (185 lb 9.6 oz)   SpO2 93%   BMI 25.17 kg/m   Filed Weights   05/04/18 1048  Weight: 84.2 kg (185 lb 9.6 oz)      Wt Readings from Last 3 Encounters:  05/04/18 84.2 kg (185 lb 9.6 oz)  04/27/18 79.5 kg (175 lb 3.2 oz)  04/22/18 79.4 kg (175 lb)   General:  Well appearing. No resp difficulty HEENT: normal Neck: supple. JVP 12-13 Carotids 2+ bilat; no bruits. No lymphadenopathy or thryomegaly appreciated. Cor: PMI nondisplaced. Regular rate & rhythm. No rubs, gallops or murmurs. Lungs: clear Abdomen: soft, nontender,distended. No hepatosplenomegaly. No bruits or masses. Good bowel sounds. Extremities: no cyanosis, clubbing, rash, edema Neuro:  alert & orientedx3, cranial nerves grossly intact. moves all 4 extremities w/o difficulty. Affect pleasant  Assessment/Plan: 1. CAD: status post CABG.  Cardiolite in 2/18 showed infarction, no ischemia.  -No s/s ischemia  - Continue ASA and statin.   2. Chronic systolic CHF: Ischemic CMP.  Echo (10/18) with EF 20-25%.  Has Medtronic ICD.   Optivol- trending up above threshold. Activity ~4 hours per day. No VT/AF NYHA II Volume status elevated.   Continue lasix 80 mg twice a day and start scheduled 2.5 mg metolazone every Wednesday + extra 20 meq potassium. .  - Continue Coreg 3.125 mg bid. - Continue current eplerenone - Continue losartan 12. 54m  and losartan.  3. Depression: Continue Celexa 4. Mitral regurgitation: Severe, probably infarct-related.  He may be a good Mitraclip candidate.   - He has been seen  by Dr Burt Knack and he is a candidate for mitral clip but we are waiting on FDA approval.  5. CKD Stage III: Creatinine baseline 1.3-1.5  Check BMET today.  6. H/O GI bleed: Resolved.   Follow up in 4 weeks. Continue HF Paramedicine.  Greater than 50% of the 25 minute visit was spent in counseling/coordination of care regarding disease state education, salt/fluid restriction, sliding scale diuretics, and medication compliance.   Aedan Geimer NP-C  05/04/2018

## 2018-05-10 ENCOUNTER — Encounter (HOSPITAL_COMMUNITY): Payer: Self-pay

## 2018-05-10 ENCOUNTER — Other Ambulatory Visit (HOSPITAL_COMMUNITY): Payer: Self-pay

## 2018-05-10 ENCOUNTER — Other Ambulatory Visit (HOSPITAL_COMMUNITY): Payer: Self-pay | Admitting: Internal Medicine

## 2018-05-10 NOTE — Progress Notes (Signed)
Bp: 86/58, pulse: 61, resp: 18, spo2: 96, last weight: 175, todays: 174  Carl [name] states feels ok today.  He is not as active as he was, his daughter has his vehicle so he is staying home more.  Discussed with Iyad about if he thought he could cook in a crockpot.  Described it and I will make him a grocery list and assist him in preparing crock pot meals.  Discussed diet and watching how much fluid he drinks.  Meds verified and med boxes refilled.  Will continue to visit [name] weekly. Lungs are clear, no swelling in extremities.  He states abdomen is not tight, denies headaches or dizzines.  His only complaint is his leg hurting.   [place] [phone]

## 2018-05-18 ENCOUNTER — Encounter (HOSPITAL_COMMUNITY): Payer: Self-pay

## 2018-05-18 ENCOUNTER — Other Ambulatory Visit (HOSPITAL_COMMUNITY): Payer: Self-pay

## 2018-05-18 NOTE — Progress Notes (Signed)
Bp: 84/60, pulse: 68, resp: 18, spo2: 96 last weight: 174 lbstodays : 173  Came to visit Carl [name], he has not picked up his meds that I called in last week.  He states will get them this evening.  Will come back by in AM and place his metolazone and extra potassium in his med box. Meds verified. His weight has been gradually going down this month, he is staying around 172-173 past week.  Discussed diet again today, made a grocery list to help him with making crock pot meals.  Discussed low sodium with him and how it effects the body.  Lungs were clear, he states feels ok today.  He states been watching how much he drinks a day now.   He denies chest pain, headaches or dizziness.  He has been taking his meds according to med box. Will continue to visit weekly and educate on heart failure.   [place] [phone]

## 2018-05-20 ENCOUNTER — Ambulatory Visit (INDEPENDENT_AMBULATORY_CARE_PROVIDER_SITE_OTHER): Payer: Medicare Other

## 2018-05-20 DIAGNOSIS — Z95810 Presence of automatic (implantable) cardiac defibrillator: Secondary | ICD-10-CM | POA: Diagnosis not present

## 2018-05-20 DIAGNOSIS — I5022 Chronic systolic (congestive) heart failure: Secondary | ICD-10-CM | POA: Diagnosis not present

## 2018-05-20 NOTE — Progress Notes (Signed)
EPIC Encounter for ICM Monitoring  Patient Name: Carl [name] is a 67 y.o. male Date: 05/20/2018 Primary Care Physican: [name], MD Primary Cardiologist:[place] Electrophysiologist: Allred DryWeight:174 lbs per Paramedicine note on 9/24      Call to brother [name].  Heart Failure questions reviewed, pt asymptomatic.   Thoracic impedance normal.  Prescribed: Furosemide80 mg1tablet (80 mg total)twice a day.Potassium 20 mEq 1 tablet twice a day.  Labs: 09/21/2017 Creatinine 1.70, BUN 28, Potassium 4.3, Sodium 133, EGFR 40-47 09/09/2017 Creatinine1.53, BUN35, Potassium4.4, Sodium134, NARU24-00 08/13/2017 Creatinine1.43, BUN35, Potassium4.2, Sodium131, EGFR50-57  06/19/2017 Creatinine1.60, BUN17, Potassium4.8, Sodium135, NEYH70-44  06/12/2017 Creatinine1.43, BUN23, Potassium3.4, Sodium136, EGFR50-58  06/10/2017 Creatinine1.38, BUN21, Potassium3.8, Sodium136, EGFR52->60  05/27/2017 Creatinine1.47, BUN15, Potassium3.4, PODGWZ590, PNYO19-54  05/25/2017 Creatinine1.36, BUN15, Potassium2.8, Sodium138, EGFR53->60  04/28/2017 Creatinine1.97, BUN31, Potassium3.9, Sodium139, OIOD44-39 A complete set of results can be found in Results Review.  Recommendations: No changes.   Encouraged to call for fluid symptoms.  Follow-up plan: ICM clinic phone appointment on 06/22/2018.   Office appointment scheduled 06/01/2018 with HF clinic NP/PA.    Copy of ICM check sent to Dr. Rayann Heman.   3 month ICM trend: 05/20/2018    1 Year ICM trend:       Rosalene Billings, RN 05/20/2018 9:30 AM

## 2018-05-26 ENCOUNTER — Telehealth (HOSPITAL_COMMUNITY): Payer: Self-pay

## 2018-05-26 ENCOUNTER — Other Ambulatory Visit (HOSPITAL_COMMUNITY): Payer: Self-pay

## 2018-05-26 ENCOUNTER — Other Ambulatory Visit (HOSPITAL_COMMUNITY): Payer: Self-pay | Admitting: Internal Medicine

## 2018-05-26 NOTE — Progress Notes (Signed)
Bp: 80/50, pulse: 64, spo2: 97, last weight: 173, todays: 171  Carl [name] states feels good today.  He denies shortness of breath, dizziness, chest pain or headaches.  He has no swelling in extremities.  According his med box he takes his meds everyday.  Abdomen does not feel tight. Lungs are clear. He has went down on his weight past couple of weeks.  Called in refills for atorvstatin, klor-con, furosemide, metolazone. Meds verified and med box refilled.  Discuss diet with him, will show Vencil how to cook in crockpot next visit.  He has a grocery list to get this weekend. Nuchem blood pressure is lower than his normal today, contacted [facility] HF clinic and left message with triage.  Will continue to visit weekly to educate on diet, fill med boxes and heart failure.   [place] [phone]

## 2018-05-26 NOTE — Telephone Encounter (Signed)
   Please advise to hold losartan tonight. Restart tomorrow.   Thanks Jozsef Wescoat NP-C

## 2018-05-26 NOTE — Telephone Encounter (Signed)
Kristi notified

## 2018-05-26 NOTE — Telephone Encounter (Signed)
Spoke with Carl [name] about holding his Losartan for tonight.  Explained the pill to him and he repeated it back to me.  Explained to him only tonight to hold it and continue regular meds in med box tomorrow.  He expressed her understood.  This was order from HF clinic in [place] per Amy.   [place] [phone]

## 2018-05-26 NOTE — Telephone Encounter (Signed)
Kristi (paramedicine) called to state that pt's bp has been down to 80/50. Pt is due for metolazone today and he currently is taking potassium. Steffanie Dunn would like to know if he should hold his losartan tonight. Please advise.

## 2018-06-01 ENCOUNTER — Telehealth (HOSPITAL_COMMUNITY): Payer: Self-pay

## 2018-06-01 ENCOUNTER — Encounter (HOSPITAL_COMMUNITY): Payer: Medicare Other

## 2018-06-01 NOTE — Telephone Encounter (Signed)
Contacted Jerame this evening due to noticed he did not show for his appt.  He states he was confused and thought it was Thursday.  Rescheduled it for tomorrow and he appears to understand to go in the am at 9:30.    [place] [phone]

## 2018-06-02 ENCOUNTER — Encounter (HOSPITAL_COMMUNITY): Payer: Self-pay

## 2018-06-02 ENCOUNTER — Ambulatory Visit (HOSPITAL_COMMUNITY)
Admission: RE | Admit: 2018-06-02 | Discharge: 2018-06-02 | Disposition: A | Discharge status: Home / Self Care | Payer: Medicare Other | Source: Ambulatory Visit | Attending: Internal Medicine | Admitting: Internal Medicine

## 2018-06-02 ENCOUNTER — Telehealth (HOSPITAL_COMMUNITY): Payer: Self-pay

## 2018-06-02 VITALS — BP 108/78 | HR 76 | Wt 179.0 lb

## 2018-06-02 DIAGNOSIS — I5023 Acute on chronic systolic (congestive) heart failure: Secondary | ICD-10-CM | POA: Diagnosis not present

## 2018-06-02 DIAGNOSIS — Z951 Presence of aortocoronary bypass graft: Secondary | ICD-10-CM

## 2018-06-02 DIAGNOSIS — I5022 Chronic systolic (congestive) heart failure: Secondary | ICD-10-CM | POA: Insufficient documentation

## 2018-06-02 DIAGNOSIS — F70 Mild intellectual disabilities: Secondary | ICD-10-CM | POA: Insufficient documentation

## 2018-06-02 DIAGNOSIS — N182 Chronic kidney disease, stage 2 (mild): Secondary | ICD-10-CM

## 2018-06-02 DIAGNOSIS — N183 Chronic kidney disease, stage 3 (moderate): Secondary | ICD-10-CM | POA: Diagnosis not present

## 2018-06-02 DIAGNOSIS — I519 Heart disease, unspecified: Secondary | ICD-10-CM

## 2018-06-02 DIAGNOSIS — Z79899 Other long term (current) drug therapy: Secondary | ICD-10-CM | POA: Insufficient documentation

## 2018-06-02 DIAGNOSIS — L80 Vitiligo: Secondary | ICD-10-CM | POA: Diagnosis not present

## 2018-06-02 DIAGNOSIS — F329 Major depressive disorder, single episode, unspecified: Secondary | ICD-10-CM | POA: Diagnosis not present

## 2018-06-02 DIAGNOSIS — I2510 Atherosclerotic heart disease of native coronary artery without angina pectoris: Secondary | ICD-10-CM | POA: Diagnosis not present

## 2018-06-02 DIAGNOSIS — Z87891 Personal history of nicotine dependence: Secondary | ICD-10-CM | POA: Diagnosis not present

## 2018-06-02 DIAGNOSIS — I340 Nonrheumatic mitral (valve) insufficiency: Secondary | ICD-10-CM

## 2018-06-02 DIAGNOSIS — Z7982 Long term (current) use of aspirin: Secondary | ICD-10-CM | POA: Insufficient documentation

## 2018-06-02 LAB — BASIC METABOLIC PANEL
ANION GAP: 10 (ref 5–15)
BUN: 28 mg/dL — ABNORMAL HIGH (ref 8–23)
CO2: 25 mmol/L (ref 22–32)
Calcium: 9.2 mg/dL (ref 8.9–10.3)
Chloride: 101 mmol/L (ref 98–111)
Creatinine, Ser: 1.32 mg/dL — ABNORMAL HIGH (ref 0.61–1.24)
GFR calc non Af Amer: 55 mL/min — ABNORMAL LOW (ref >60)
GLUCOSE: 101 mg/dL — AB (ref 70–99)
POTASSIUM: 3.5 mmol/L (ref 3.5–5.1)
Sodium: 136 mmol/L (ref 135–145)

## 2018-06-02 NOTE — Progress Notes (Signed)
Patient ID: Carl [name], male   DOB: 22-Sep-1950, 67 y.o.   MRN: [id number] PCP: Dr. Ginette Pitman Cardiology: Dr. [name] is a 67 y.o. male with history of smoking and mild mental retardation was admitted to [facility] in 12/15 with dyspnea.  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  LHC showed 3 vessel disease with EF 15%.  Echo showed EF 15-20%.  Patient had CABG x 5.  It was difficult to wean him off pressors post-op.  He ended up having to start midodrine but was later weaned off.   Admitted 10/17 with volume overload and AKI. Diuresed with IV lasix and transitiioned to 40 mg lasix daily. Spiro, dig, lisinopril stopped due to elevated creatinine 2.28. Discharge weight 163 pounds.    Echo in 2/18 showed EF 30-35%, mild LV dilation, rWMAs, moderate MR, mild to moderately decreased RV systolic function. Cardiolite in 2/18 showed large inferoseptal/inferior/inferolateral infarction with no ischemia.   Admitted to [facility] 06/10/17-06/12/17 with acute on chronic systolic CHF. Diuresed 5 pounds with IV Lasix. Discharge weight was 175 pounds.  Echo in 10/18 showed EF 20-25% with severe MR, possibly ischemic MR.   He presents today for follow up. Last visit volume overloaded and metolazone scheduled once weekly. Weight down 6 lbs from that visit. He presents today with his Mom. Doing well overall. Denies SOB/PND/Orthopnea. Appetite good. Mom fixes meals, but paramedicine is going to show him how to use a crock pot. Paramedicine following. BP runs soft at home, but denies lightheadedness or dizziness. He is able to drive and run errands.   ICD interrogation: No VT/VF. No AF. Pt activity 3-4 hrs daily. Thoracic impedence normal. No optivol/fluid index elevation.   Labs (12/15): K 4.1, creatinine 0.81, hgb 9.1 Labs (09/12/2014): K 3.7 Creatinine 0.96, digoxin 0.7 Labs (3/16): digoxin 0.8, LDL 62, HDL 40 Labs (4/16): K 4.3, creatinine 1.1 Labs (5/16) K 4.6, creatinine 1.24, digoxin 1.0 Labs  (05/2016): K 3.6 Creatinine 1.47  => 1.37 Labs (11/17): LDL 61, HDL 41, K 4.2, creaitnine 1.1 Labs (2/18): K 3.7 => 5.3, creatinine 1.84 => 1.35, BNP 924 Labs (3/18): K 4.8, creatinine 1.33, BNP 552 Labs (9/18): K 3.9, creatinine 1.97 Labs (08/03/2018): K 4.2 Creatinine 1.6 Labs ( 09/09/2017): K 4.4 Creatinine 1.53 Labs (3/19): K 4.5, creatinine 1.5 Labs (02/02/18): K 3.9 Creatinine 1.8   Optivol:   PMH: 1. Smoker 2. Mild mental retardation 3. CAD: LHC (12/15) with 3 vessel disease.  CABG x 5 in 12/15 with LIMA-LAD, SVG-D1, sequential SVG-OM2/OM3, SVG-PDA.   - Cardiolite (2/18):  Large inferoseptal/inferior/inferolateral infarction with no ischemia, EF 29%.  4. Ischemic cardiomyopathy: Echo (12/15) with EF 15-20%, wall motion abnormalities, mildly decreased RV systolic function, mild MR.  Echo (3/16) with EF 30-35%, severe LV dilation, moderate MR, PA systolic pressure 42 mmHg. Echo (8/16) with EF 25-30%, severely dilated LV, diffuse hypokinesis with inferior akinesis, restrictive diastolic function, RV mildly dilated with mildly decreased systolic function, moderate MR.  - Echo (10/17): EF 20-25%, moderate MR.  - Medtronic ICD.  - ACEI cough. Gynecomastia with spironolacon - Echo (2/18): EF 30-35%, mild LV dilation, regional WMAs, moderate diastolic dysfunction, normal RV size with mild to moderately decreased systolic function, moderate MR (likely infarct-related).  - Echo (10/18): EF 20-25%, severe MR (likely ischemic).  5. Depression 6. Vitiligo 7. Mitral regurgitation: Moderate on 2/18, likely infarct-related.  - Severe on 10/18 echo, likely infarct-related. 8. CKD stage III.  9. Melena- 08/24/2018  EGD/Colonoscopy polyp and gastritis.   SH: Lives alone, quit smoking in 12/15.  No ETOH.   FH: Brother and father with MIs.    Review of systems complete and found to be negative unless listed in HPI.    Current Outpatient Medications  Medication Sig Dispense Refill  .  acetaminophen (TYLENOL) 325 MG tablet Take 325 mg by mouth every 6 (six) hours as needed.    Marland Kitchen aspirin 81 MG EC tablet Take 81 mg by mouth daily.  11  . atorvastatin (LIPITOR) 40 MG tablet Take 1 tablet (40 mg total) by mouth daily at 6 PM. 30 tablet 1  . carvedilol (COREG) 3.125 MG tablet Take 3.125 mg by mouth 2 (two) times daily with a meal.    . citalopram (CELEXA) 20 MG tablet Take 20 mg by mouth daily.   5  . cyanocobalamin 500 MCG tablet Take 500 mcg by mouth daily.    . cyclobenzaprine (FLEXERIL) 10 MG tablet Take 10 mg by mouth as needed.  0  . eplerenone (INSPRA) 25 MG tablet Take 1 tablet (25 mg total) by mouth daily. 90 tablet 3  . ferrous sulfate 325 (65 FE) MG tablet Take 1 tablet (325 mg total) by mouth daily with breakfast. 30 tablet 3  . furosemide (LASIX) 80 MG tablet TAKE 1 TABLET (80 MG TOTAL) BY MOUTH 2 (TWO) TIMES DAILY. 180 tablet 1  . ibuprofen (ADVIL,MOTRIN) 600 MG tablet Take 1 tablet (600 mg total) by mouth every 8 (eight) hours as needed. 30 tablet 0  . losartan (COZAAR) 25 MG tablet Take 0.5 tablets (12.5 mg total) daily by mouth.    . metolazone (ZAROXOLYN) 2.5 MG tablet Take 1 tablet (2.5 mg total) by mouth once a week. Wednesday mornings. Take 1 EXTRA Potassium tablet with this. 5 tablet 11  . omeprazole (PRILOSEC) 20 MG capsule Take 20 mg by mouth 2 (two) times daily.    . potassium chloride SA (KLOR-CON M20) 20 MEQ tablet Take 1 tablet (20 mEq total) by mouth 2 (two) times daily. Take 1 EXTRA tablet on Wednesday mornings with Metolazone. 60 tablet 2   No current facility-administered medications for this encounter.    Vitals:   06/02/18 0956  BP: 108/78  Pulse: 76  SpO2: 95%  Weight: 81.2 kg (179 lb)     Wt Readings from Last 3 Encounters:  06/02/18 81.2 kg (179 lb)  05/26/18 77.6 kg (171 lb)  05/18/18 78.5 kg (173 lb)   General:  Well appearing. No resp difficulty. HEENT: Normal Neck: Supple. JVP 5-6. Carotids 2+ bilat; no bruits. No thyromegaly or  nodule noted. Cor: PMI nondisplaced. RRR, No M/G/R noted Lungs: CTAB, normal effort. Abdomen: Soft, non-tender, non-distended, no HSM. No bruits or masses. +BS   Extremities: No cyanosis, clubbing, or rash. R and LLE no edema.  Neuro: Alert & orientedx3, cranial nerves grossly intact. moves all 4 extremities w/o difficulty. Affect pleasant   Assessment/Plan: 1. CAD: status post CABG.  - Cardiolite in 2/18 showed infarction, no ischemia.  - No s/s of ischemia.    - Continue ASA and statin.   2. Chronic systolic CHF:  - Ischemic CMP.  Echo (10/18) with EF 20-25%.  Has Medtronic ICD.   - TEE 02/10/18 with EF 25-30%, severe MR.  - NYHA II-III symptoms - Volume status stable on exam and optivol.   - Continue lasix 80 mg BID - Continue metolazone 2.5 mg every Wednesday with extra 20 meq of K.  - Continue  Coreg 3.125 mg bid. - Continue eplerenone 25 mg daily. Low threshold to cut back if BP remains low.  - Continue losartan 12.5 mg daily. No room to uptitrate with pressures in 80-90s occassionally at home.  - Reinforced fluid restriction to < 2 L daily, sodium restriction to less than 2000 mg daily, and the importance of daily weights.   3. Depression:  - Continue Celexa 4. Mitral regurgitation:  - Severe, probably infarct-related.  He may be a good Mitraclip candidate.  Severe TR by TEE 02/10/18 - He has been seen by Dr Burt Knack and he is a candidate for mitral clip but we are waiting on FDA approval.  5. CKD Stage III: Creatinine baseline 1.3-1.5  - BMET today.  6. H/O GI bleed:  - Resolved.    Doing well overall. No room to increase medicines with soft pressures at home. BMET today. RTC 2-3 months with MD.   Shirley Friar, PA-C  06/02/2018   Greater than 50% of the 25 minute visit was spent in counseling/coordination of care regarding disease state education, salt/fluid restriction, sliding scale diuretics, and medication compliance.

## 2018-06-02 NOTE — Progress Notes (Signed)
CSW met with patient and patient sister, Carl [name], to discuss social determinants of health.  Patient lives in a house that was passed down from his parents- lives there alone but doesn't have any concerns regarding his wellbeing at home or the safety of his home environment.  Patient and sister report that there are not any concerns regarding paying for food (sister often comes and cooks), utilities, or medications.  No CSW intervention needed at this time- CSW will continue to follow and clinic and assist as needed  Jorge Ny, [place] Social Worker [phone]

## 2018-06-02 NOTE — Patient Instructions (Addendum)
Today you have been seen at the Heart failure clinic at [facility]   Lab work done today: BMET   Follow up with Einar Crow  in 2 months.

## 2018-06-02 NOTE — Telephone Encounter (Signed)
Called pt to see If he is taking an extra 40 meq of K (2 tablets) on metolazone days. Will call paramedic kristi as well to see if he is taken it .

## 2018-06-07 ENCOUNTER — Telehealth (HOSPITAL_COMMUNITY): Payer: Self-pay | Admitting: *Deleted

## 2018-06-07 ENCOUNTER — Other Ambulatory Visit (HOSPITAL_COMMUNITY): Payer: Self-pay

## 2018-06-07 NOTE — Progress Notes (Signed)
Bp: 86/50, pulse: 64, spo2: 97 Last weight: 173 lbs, today: 178.8 lbs  Today Elan is up 5 pounds from my last visit but 8 pounds since Friday according to his weights.  He states ate bad this weekend, spaghetti for one meal.   He denies shortness of breath,  No swelling in lower extremities.  [place] [facility] was calling stating his weight was up also.  He denies chest pain, he states feels ok except his ankles hurt.  Meds verified, he takes metolazone on wednesday with extra potassium.  He has been taking all his meds.  Meds verified.  Contacted [place] [facility] and spoke to [place]. Jasmine advised to take metolazone today with extra potassium and recheck Wednesday to see if he needs Wednesday dose of metolazone or not. Showed Ines how to cook in crock pot today.  He did the placing of food in crock pot and explained it all to him. Not sure if he will be able to do it his self.  Will cook with him again Wednesday.  Will continue to visit weekly to educate on diet and heart failure.   [place] [phone]

## 2018-06-07 NOTE — Telephone Encounter (Signed)
Carl [name] called to report patients weight gain of 8lbs since Friday. Patient is asymptomatic and she does not see any swelling. Although, patient does c/o ankle pain.  Patient takes weekly metolazone 2.5mg  on Wednesday per Caryl Pina Smith,NP patient may take his metolazone today. If patients weight is still up he can still take Metolazone 2.5mg  Wednesday. Carl [name] will see patient on Wednesday and call with an update.

## 2018-06-10 ENCOUNTER — Encounter (HOSPITAL_COMMUNITY): Payer: Self-pay

## 2018-06-10 ENCOUNTER — Other Ambulatory Visit (HOSPITAL_COMMUNITY): Payer: Self-pay

## 2018-06-10 NOTE — Progress Notes (Signed)
Bp: 80/50, pulse: 67, resp: 16, spo2: 97 last weight: 178 todays: 175   His weight is up 3 lbs from yesterday.  He took extra metolazone Tuesday but not his regular dose yesterday.  He is taking his metolazone today with extra potassium, advised HF clinic in [place].  Lungs are clear, no swelling in extremities.  He states feels ok.  He is cooking chicken in crock pot today when I walked in.  He states he likes it.  He has been compliant with meds.  Placed meds in box.  Will visit weekly to fill med boxes, keep him compliant with meds and educate on diet and heart failure.   [place] [phone]

## 2018-06-14 ENCOUNTER — Other Ambulatory Visit (HOSPITAL_COMMUNITY): Payer: Self-pay

## 2018-06-14 ENCOUNTER — Other Ambulatory Visit (HOSPITAL_COMMUNITY): Payer: Self-pay | Admitting: Internal Medicine

## 2018-06-14 ENCOUNTER — Encounter (HOSPITAL_COMMUNITY): Payer: Self-pay

## 2018-06-14 NOTE — Progress Notes (Signed)
Bp: 86/56, pulse: 58, resp: 18, spo2: 95 last weight: 175 lbs: todays: 174 lbs  Today visit with Carl [name] he states feels tired today. He appears to be out of sorts today but by the time I left he was back to his normal, he sates I woke him up from a nap.  In the past month, Carl [name] has become less active.  Stays at home more and not out driving.  He states forgot to get his meds, he will get them this evening.  Will have to come back and finish his meds for him.  Meds verified, he is compliance with his meds.  He states been eating good.  He states watches his fluids.  He denies chest pain, dizziness or headaches. Lungs clear. Called pharmacy to make sure meds ready for atorvastatin and metolazone, they are ready to pick up.  Will continue to visit weekly to educate on diet and heart failure.   [place] [phone]

## 2018-06-15 ENCOUNTER — Other Ambulatory Visit: Payer: Self-pay | Admitting: Adult Health

## 2018-06-22 ENCOUNTER — Ambulatory Visit (INDEPENDENT_AMBULATORY_CARE_PROVIDER_SITE_OTHER): Payer: Medicare Other

## 2018-06-22 DIAGNOSIS — Z95810 Presence of automatic (implantable) cardiac defibrillator: Secondary | ICD-10-CM

## 2018-06-22 DIAGNOSIS — I5022 Chronic systolic (congestive) heart failure: Secondary | ICD-10-CM

## 2018-06-23 NOTE — Progress Notes (Signed)
EPIC Encounter for ICM Monitoring  Patient Name: Carl [name] is a 67 y.o. male Date: 06/23/2018 Primary Care Physican: [name], MD Primary Cardiologist:[place] Electrophysiologist: Allred DryWeight:178.6 lbs       Spoke with brother.  Heart Failure questions reviewed, pt asymptomatic.   Thoracic impedance abnormal suggesting fluid accumulation but trending to baseline.  Transmission received before taking Wednesday Metolazone dose.  Prescribed: Furosemide80 mg1tablet (80 mg total)twice a day.Metolazone 2.5 mg Take 1 tablet (2.5 mg total) by mouth once a week. Wednesday mornings and take 1 EXTRA Potassium tablet with this.  Potassium 20 mEq 1 tablet twice a day.  Labs: 06/02/2018 Creatinine 1.32, BUN 28, Potassium 3.5, Sodium 136, eGFR 55->60 05/04/2018 Creatinine 1.48, BUN 20, Potassium 3.6, Sodium 136, eGFR 48-55  04/13/2018 Creatinine 1.64, BUN 23, Potassium 3.7, Sodium 138, eGFR 42-49  02/02/2018 Creatinine 1.83, BUN 33, Potassium 3.9, Sodium 138, eGFR 37-43  11/02/2017 Creatinine 1.51, BUN 37, Potassium 4.5, Sodium 133, eGFR 46-55  09/21/2017 Creatinine 1.70, BUN 28, Potassium 4.3, Sodium 133, EGFR 40-47 09/09/2017 Creatinine1.53, BUN35, Potassium4.4, Sodium134, [id number] A complete set of results can be found in Results Review.  Recommendations:   Advised to limit salt and that taking Metolazone today as prescribed should return impedance to baseline.   Follow-up plan: ICM clinic phone appointment on 07/01/2018 to recheck fluid levels.     Copy of ICM check sent to Dr. Rayann Heman and Dr [name].   3 month ICM trend: 06/22/2018    1 Year ICM trend:       Rosalene Billings, RN 06/23/2018 8:13 AM

## 2018-07-01 ENCOUNTER — Encounter (HOSPITAL_COMMUNITY): Payer: Self-pay

## 2018-07-01 ENCOUNTER — Other Ambulatory Visit (HOSPITAL_COMMUNITY): Payer: Self-pay

## 2018-07-01 ENCOUNTER — Ambulatory Visit (INDEPENDENT_AMBULATORY_CARE_PROVIDER_SITE_OTHER): Payer: Medicare Other

## 2018-07-01 DIAGNOSIS — I5022 Chronic systolic (congestive) heart failure: Secondary | ICD-10-CM

## 2018-07-01 DIAGNOSIS — Z95810 Presence of automatic (implantable) cardiac defibrillator: Secondary | ICD-10-CM

## 2018-07-01 MED ORDER — POTASSIUM CHLORIDE CRYS ER 20 MEQ PO TBCR: 20.0000 meq | EXTENDED_RELEASE_TABLET | Freq: Two times a day (BID) | ORAL | 3 refills | Status: DC

## 2018-07-01 NOTE — Progress Notes (Signed)
Bp: 86/60, pulse: 58, resp: 16, spo2: 96%, last weight: 174 todays: 174 lbs  Jie states been doing ok, he states been outside walking his dog some.  He states he seen his orthopaedic doctor last week and they gave him some meds but he has not took them.  They gave him prednisone for 7 days a taper pack.  He states they advised could help his leg and back feel better.  Meds verified and med box filled, placed his prednisone in med box also for him.  Called [place] [facility] clinic for refill on potassium.  [place] today denies shortness of breath, chest pain, headaches or dizziness.  He states been trying to eat better, he states has to go to store and get some more food to cook in crock pot.  Discussed more meat he could cook.  He states drinking plenty throughout the day.  His weight appears to be maintaining past few weeks.  Lungs are clear, there is no swelling in lower extremities and abdomen does not feel tight.  Will continue to visit weekly to educate on diet and heart failure.   [place] [phone]

## 2018-07-01 NOTE — Progress Notes (Signed)
EPIC Encounter for ICM Monitoring  Patient Name: Carl [name] is a 67 y.o. male Date: 07/01/2018 Primary Care Physican: [name], MD Primary Cardiologist:[place] Electrophysiologist: Allred DryWeight:Previous weight 178.6lbs           Spoke with brother.  Heart Failure questions reviewed, pt asymptomatic.  He reported the sister takes patient out to eat frequently which may contribute to decreased impedance.    Thoracic impedance abnormal suggesting fluid accumulation starting 06/11/2018 but starting to trend toward baseline.   Prescribed: Furosemide80 mg1tablet (80 mg total)twice a day.Metolazone 2.5 mg Take 1 tablet (2.5 mg total) by mouth once a week. Wednesday mornings and take 1 EXTRA Potassium tablet with this.  Potassium 20 mEq 1 tablet twice a day.  Labs: 06/02/2018 Creatinine 1.32, BUN 28, Potassium 3.5, Sodium 136, eGFR 55->60 05/04/2018 Creatinine 1.48, BUN 20, Potassium 3.6, Sodium 136, eGFR 48-55  04/13/2018 Creatinine 1.64, BUN 23, Potassium 3.7, Sodium 138, eGFR 42-49  02/02/2018 Creatinine 1.83, BUN 33, Potassium 3.9, Sodium 138, eGFR 37-43  11/02/2017 Creatinine 1.51, BUN 37, Potassium 4.5, Sodium 133, eGFR 46-55  09/21/2017 Creatinine 1.70, BUN 28, Potassium 4.3, Sodium 133, EGFR 40-47 09/09/2017 Creatinine1.53, BUN35, Potassium4.4, Sodium134, [id number] A complete set of results can be found in Results Review.  Recommendations:  [employer] foods are high in salt and to limit daily salt intake.  Encouraged to call for fluid symptoms.  Follow-up plan: ICM clinic phone appointment on 07/15/2018 to recheck fluid levels.    Copy of ICM check sent to Dr. Rayann Heman and Dr [name] for review and recommendations if needed.   3 month ICM trend: 07/01/2018    1 Year ICM trend:       Rosalene Billings, RN 07/01/2018 10:14 AM

## 2018-07-02 ENCOUNTER — Other Ambulatory Visit (HOSPITAL_COMMUNITY): Payer: Self-pay | Admitting: *Deleted

## 2018-07-02 MED ORDER — METOLAZONE 2.5 MG PO TABS: 2.5000 mg | ORAL_TABLET | ORAL | 11 refills | Status: DC

## 2018-07-02 NOTE — Progress Notes (Signed)
Take extra metolazone this week.

## 2018-07-02 NOTE — Progress Notes (Signed)
Call to brother, [name].  Advised to have patient take extra metolazone tomorrow.  Advised to take 30 minutes prior to Furosemide dosage and to take 1 extra potassium tablet as prescribed.  He verbalized understanding and repeated back instructions.

## 2018-07-05 ENCOUNTER — Ambulatory Visit (INDEPENDENT_AMBULATORY_CARE_PROVIDER_SITE_OTHER): Payer: Medicare Other | Admitting: *Deleted

## 2018-07-05 ENCOUNTER — Ambulatory Visit (INDEPENDENT_AMBULATORY_CARE_PROVIDER_SITE_OTHER): Payer: Medicare Other

## 2018-07-05 DIAGNOSIS — I5022 Chronic systolic (congestive) heart failure: Secondary | ICD-10-CM

## 2018-07-05 DIAGNOSIS — Z95810 Presence of automatic (implantable) cardiac defibrillator: Secondary | ICD-10-CM

## 2018-07-05 DIAGNOSIS — I255 Ischemic cardiomyopathy: Secondary | ICD-10-CM | POA: Diagnosis not present

## 2018-07-05 NOTE — Progress Notes (Signed)
EPIC Encounter for ICM Monitoring  Patient Name: Carl [name] is a 67 y.o. male Date: 07/05/2018 Primary Care Physican: [name], MD Primary Cardiologist:Mc[place] Electrophysiologist: Allred Last Weight: 178.6lbs Today's Weight: 171.8 lbs       Spoke with brother, [name]. Heart Failure questions reviewed, pt asymptomatic.  Wt loss after taking extra dose of Metolazone.   Thoracic impedance returned to normal after being prescribed 1 extra Metolazone dose on 07/02/2018 by Dr [name].   Prescribed: Furosemide80 mg1tablet (80 mg total)twice a day.Metolazone 2.5 mg Take 1 tablet (2.5 mg total) by mouth once a week. Wednesday mornings and take 1 EXTRA Potassium tablet with this.  Potassium 20 mEq 1 tablet twice a day.  Labs: 06/02/2018 Creatinine 1.32, BUN 28, Potassium 3.5, Sodium 136, eGFR 55->60 05/04/2018 Creatinine 1.48, BUN 20, Potassium 3.6, Sodium 136, eGFR 48-55  04/13/2018 Creatinine 1.64, BUN 23, Potassium 3.7, Sodium 138, eGFR 42-49  02/02/2018 Creatinine 1.83, BUN 33, Potassium 3.9, Sodium 138, eGFR 37-43  11/02/2017 Creatinine 1.51, BUN 37, Potassium 4.5, Sodium 133, eGFR 46-55  09/21/2017 Creatinine 1.70, BUN 28, Potassium 4.3, Sodium 133, EGFR 40-47 09/09/2017 Creatinine1.53, BUN35, Potassium4.4, Sodium134, [id number] A complete set of results can be found in Results Review.  Recommendations: No changes.  Encouraged to call for fluid symptoms.  Follow-up plan: ICM clinic phone appointment on 07/26/2018.       Copy of ICM check sent to Dr. Rayann Heman and Dr [name].   3 month ICM trend: 07/05/2018    1 Year ICM trend:       Rosalene Billings, RN 07/05/2018 9:08 AM

## 2018-07-05 NOTE — Progress Notes (Signed)
Remote ICD transmission.   

## 2018-07-06 ENCOUNTER — Encounter (HOSPITAL_COMMUNITY): Payer: Self-pay

## 2018-07-06 ENCOUNTER — Other Ambulatory Visit (HOSPITAL_COMMUNITY): Payer: Self-pay

## 2018-07-06 NOTE — Progress Notes (Signed)
Visit with Carl [name] today, he states feeling ok.  He sates he took his extra Metolazone on Saturday with extra potassium.  Meds verified and med box filled. Lungs are clear, no edema in lower extremities.  His brother advised he has someone coming in to give Carl [name] cooking lessons.  He states been eating, appetite is good.  He states watches his sodium and watches his fluid intake.  He denies chest pain, shortness of breath, dizziness or headaches.  Will continue to visit weekly to make sure he is compliant with meds and educate on heart failure.   [place] [phone]

## 2018-07-08 ENCOUNTER — Ambulatory Visit: Payer: Medicare Other | Admitting: Podiatry

## 2018-07-12 ENCOUNTER — Encounter: Payer: Self-pay | Admitting: Podiatry

## 2018-07-12 ENCOUNTER — Ambulatory Visit (INDEPENDENT_AMBULATORY_CARE_PROVIDER_SITE_OTHER): Payer: Medicare Other | Admitting: Podiatry

## 2018-07-12 DIAGNOSIS — B351 Tinea unguium: Secondary | ICD-10-CM | POA: Diagnosis not present

## 2018-07-12 DIAGNOSIS — L84 Corns and callosities: Secondary | ICD-10-CM

## 2018-07-12 DIAGNOSIS — M79674 Pain in right toe(s): Secondary | ICD-10-CM | POA: Diagnosis not present

## 2018-07-12 DIAGNOSIS — M79675 Pain in left toe(s): Secondary | ICD-10-CM | POA: Diagnosis not present

## 2018-07-12 NOTE — Progress Notes (Signed)
Complaint:  Visit Type: Patient returns to my office for continued preventative foot care services. Complaint: Patient states" my nails have grown long and thick and become painful to walk and wear shoes"  The patient presents for preventative foot care services. No changes to ROS  Podiatric Exam: Vascular: dorsalis pedis and posterior tibial pulses are palpable bilateral. Capillary return is immediate. Temperature gradient is WNL. Skin turgor WNL  Sensorium: Normal Semmes Weinstein monofilament test. Normal tactile sensation bilaterally. Nail Exam: Pt has thick disfigured discolored nails with subungual debris noted bilateral entire nail hallux through fifth toenails Ulcer Exam: There is no evidence of ulcer or pre-ulcerative changes or infection. Orthopedic Exam: Muscle tone and strength are WNL. No limitations in general ROM. No crepitus or effusions noted. Foot type and digits show no abnormalities. Bony prominences are unremarkable.! And 1/2 inch limb leg discrepancy. Skin: No Porokeratosis. No infection or ulcers.  Heloma durum fifth toe left foot.  Diagnosis:  Onychomycosis, , Pain in right toe, pain in left toes,  Heloma durum fifth toe left foot.  Treatment & Plan Procedures and Treatment: Consent by patient was obtained for treatment procedures.   Debridement of mycotic and hypertrophic toenails, 1 through 5 bilateral and clearing of subungual debris. No ulceration, no infection noted. Debride hd fifth toe right. Return Visit-Office Procedure: Patient instructed to return to the office for a follow up visit 3 months for continued evaluation and treatment.    Gardiner Barefoot DPM

## 2018-07-13 ENCOUNTER — Encounter (HOSPITAL_COMMUNITY): Payer: Self-pay

## 2018-07-13 ENCOUNTER — Other Ambulatory Visit (HOSPITAL_COMMUNITY): Payer: Self-pay

## 2018-07-13 ENCOUNTER — Telehealth (HOSPITAL_COMMUNITY): Payer: Self-pay | Admitting: Cardiology

## 2018-07-13 NOTE — Telephone Encounter (Signed)
Krisiti called during patients home visit to report 10 lb weight gain in  5 days. Lungs clear No edema or increased SOB reported  Patient is scheduled for a weekly dose of metolazone on 07/14/18  Per [place] to take weekly dose 11/20 AND another dose on 11/21-be sure to take additional K as well  Krisiti aware of orders and voiced understanding

## 2018-07-13 NOTE — Progress Notes (Signed)
Bp: 86/60, pulse: 68, last weight: 174 lbs,  todays weight: 182 lbs, spo2: 94%  Today [name] states feels ok.  He appears to be compliant with his meds, pills are missing out of med box, he states he is taking them.  He is trying to watch high sodium foods and watches how much fluids he drinks.  When arrived today he was eating fried chicken from [employer].  He state he ate at a buffet this past weekend and ate a lot.  Attempted to show him how to cook in crockpot and he did a couple of times but have not since.  Will continue to work on diet and get family involved.  His sister brings him food some and takes him out to eat, she does not watch his sodium.  He denies chest pain, shortness of breath, dizziness or headaches. Lung sounds are clear and no swelling in lower extremities. He stays active, have been out driving a little more. He has someone coming to help him out some with appts and going to try to teach him how to cook. Meds verified and med box refilled. Contacted Chantel at HF clinic in [place] and advised Devansh has weight gain of 10 pounds in past 4 days, she advised per Jonni Sanger to take normal dose of metolazone tomorrow and extra metolazone on Thursday with extra potassium.  Have placed in med box.  Will continue to visit weekly.   [place] [phone]

## 2018-07-21 ENCOUNTER — Other Ambulatory Visit: Payer: Self-pay

## 2018-07-21 ENCOUNTER — Encounter: Payer: Self-pay | Admitting: Orthopedic Surgery

## 2018-07-21 ENCOUNTER — Inpatient Hospital Stay (HOSPITAL_COMMUNITY)
Admission: EM | Admit: 2018-07-21 | Discharge: 2018-07-27 | DRG: 481 | Disposition: A | Discharge status: Skilled Nursing Facility | Payer: Medicare Other | Attending: Internal Medicine | Admitting: Internal Medicine

## 2018-07-21 ENCOUNTER — Encounter (HOSPITAL_COMMUNITY): Payer: Self-pay | Admitting: Emergency Medicine

## 2018-07-21 DIAGNOSIS — F329 Major depressive disorder, single episode, unspecified: Secondary | ICD-10-CM | POA: Diagnosis present

## 2018-07-21 DIAGNOSIS — N183 Chronic kidney disease, stage 3 unspecified: Secondary | ICD-10-CM | POA: Diagnosis present

## 2018-07-21 DIAGNOSIS — I130 Hypertensive heart and chronic kidney disease with heart failure and stage 1 through stage 4 chronic kidney disease, or unspecified chronic kidney disease: Secondary | ICD-10-CM | POA: Diagnosis present

## 2018-07-21 DIAGNOSIS — I5022 Chronic systolic (congestive) heart failure: Secondary | ICD-10-CM | POA: Diagnosis present

## 2018-07-21 DIAGNOSIS — I493 Ventricular premature depolarization: Secondary | ICD-10-CM | POA: Diagnosis present

## 2018-07-21 DIAGNOSIS — E785 Hyperlipidemia, unspecified: Secondary | ICD-10-CM | POA: Diagnosis not present

## 2018-07-21 DIAGNOSIS — Z8711 Personal history of peptic ulcer disease: Secondary | ICD-10-CM | POA: Diagnosis not present

## 2018-07-21 DIAGNOSIS — M1990 Unspecified osteoarthritis, unspecified site: Secondary | ICD-10-CM | POA: Diagnosis present

## 2018-07-21 DIAGNOSIS — I959 Hypotension, unspecified: Secondary | ICD-10-CM | POA: Diagnosis not present

## 2018-07-21 DIAGNOSIS — Z7982 Long term (current) use of aspirin: Secondary | ICD-10-CM

## 2018-07-21 DIAGNOSIS — D509 Iron deficiency anemia, unspecified: Secondary | ICD-10-CM | POA: Diagnosis present

## 2018-07-21 DIAGNOSIS — S72002A Fracture of unspecified part of neck of left femur, initial encounter for closed fracture: Secondary | ICD-10-CM | POA: Diagnosis present

## 2018-07-21 DIAGNOSIS — Z955 Presence of coronary angioplasty implant and graft: Secondary | ICD-10-CM

## 2018-07-21 DIAGNOSIS — H9190 Unspecified hearing loss, unspecified ear: Secondary | ICD-10-CM | POA: Diagnosis present

## 2018-07-21 DIAGNOSIS — Z419 Encounter for procedure for purposes other than remedying health state, unspecified: Secondary | ICD-10-CM

## 2018-07-21 DIAGNOSIS — L409 Psoriasis, unspecified: Secondary | ICD-10-CM | POA: Diagnosis present

## 2018-07-21 DIAGNOSIS — Z95810 Presence of automatic (implantable) cardiac defibrillator: Secondary | ICD-10-CM

## 2018-07-21 DIAGNOSIS — M84359A Stress fracture, hip, unspecified, initial encounter for fracture: Secondary | ICD-10-CM | POA: Diagnosis present

## 2018-07-21 DIAGNOSIS — Z833 Family history of diabetes mellitus: Secondary | ICD-10-CM

## 2018-07-21 DIAGNOSIS — Z8249 Family history of ischemic heart disease and other diseases of the circulatory system: Secondary | ICD-10-CM

## 2018-07-21 DIAGNOSIS — K449 Diaphragmatic hernia without obstruction or gangrene: Secondary | ICD-10-CM | POA: Diagnosis present

## 2018-07-21 DIAGNOSIS — I472 Ventricular tachycardia: Secondary | ICD-10-CM | POA: Diagnosis present

## 2018-07-21 DIAGNOSIS — I252 Old myocardial infarction: Secondary | ICD-10-CM | POA: Diagnosis not present

## 2018-07-21 DIAGNOSIS — F32A Depression, unspecified: Secondary | ICD-10-CM | POA: Diagnosis present

## 2018-07-21 DIAGNOSIS — Z8781 Personal history of (healed) traumatic fracture: Secondary | ICD-10-CM

## 2018-07-21 DIAGNOSIS — I2510 Atherosclerotic heart disease of native coronary artery without angina pectoris: Secondary | ICD-10-CM | POA: Diagnosis present

## 2018-07-21 DIAGNOSIS — Z951 Presence of aortocoronary bypass graft: Secondary | ICD-10-CM

## 2018-07-21 DIAGNOSIS — I10 Essential (primary) hypertension: Secondary | ICD-10-CM | POA: Diagnosis present

## 2018-07-21 DIAGNOSIS — I255 Ischemic cardiomyopathy: Secondary | ICD-10-CM | POA: Diagnosis present

## 2018-07-21 DIAGNOSIS — K219 Gastro-esophageal reflux disease without esophagitis: Secondary | ICD-10-CM | POA: Diagnosis present

## 2018-07-21 DIAGNOSIS — Z888 Allergy status to other drugs, medicaments and biological substances status: Secondary | ICD-10-CM

## 2018-07-21 DIAGNOSIS — Y92019 Unspecified place in single-family (private) house as the place of occurrence of the external cause: Secondary | ICD-10-CM | POA: Diagnosis not present

## 2018-07-21 DIAGNOSIS — W010XXA Fall on same level from slipping, tripping and stumbling without subsequent striking against object, initial encounter: Secondary | ICD-10-CM | POA: Diagnosis present

## 2018-07-21 DIAGNOSIS — Z87891 Personal history of nicotine dependence: Secondary | ICD-10-CM

## 2018-07-21 DIAGNOSIS — E876 Hypokalemia: Secondary | ICD-10-CM | POA: Diagnosis present

## 2018-07-21 DIAGNOSIS — Z9889 Other specified postprocedural states: Secondary | ICD-10-CM

## 2018-07-21 HISTORY — DX: Fracture of unspecified part of neck of left femur, initial encounter for closed fracture: S72.002A

## 2018-07-21 LAB — BASIC METABOLIC PANEL
Anion gap: 11 (ref 5–15)
BUN: 42 mg/dL — AB (ref 8–23)
CHLORIDE: 101 mmol/L (ref 98–111)
CO2: 25 mmol/L (ref 22–32)
CREATININE: 1.53 mg/dL — AB (ref 0.61–1.24)
Calcium: 9.2 mg/dL (ref 8.9–10.3)
GFR calc Af Amer: 54 mL/min — ABNORMAL LOW (ref >60)
GFR calc non Af Amer: 47 mL/min — ABNORMAL LOW (ref >60)
Glucose, Bld: 156 mg/dL — ABNORMAL HIGH (ref 70–99)
Potassium: 3.2 mmol/L — ABNORMAL LOW (ref 3.5–5.1)
SODIUM: 137 mmol/L (ref 135–145)

## 2018-07-21 LAB — CBC WITH DIFFERENTIAL/PLATELET
Abs Immature Granulocytes: 0.02 10*3/uL (ref 0.00–0.07)
Basophils Absolute: 0 10*3/uL (ref 0.0–0.1)
Basophils Relative: 1 %
Eosinophils Absolute: 0.1 10*3/uL (ref 0.0–0.5)
Eosinophils Relative: 2 %
HEMATOCRIT: 39.3 % (ref 39.0–52.0)
HEMOGLOBIN: 12.9 g/dL — AB (ref 13.0–17.0)
Immature Granulocytes: 0 %
LYMPHS ABS: 0.8 10*3/uL (ref 0.7–4.0)
LYMPHS PCT: 14 %
MCH: 32.7 pg (ref 26.0–34.0)
MCHC: 32.8 g/dL (ref 30.0–36.0)
MCV: 99.7 fL (ref 80.0–100.0)
MONO ABS: 0.5 10*3/uL (ref 0.1–1.0)
Monocytes Relative: 9 %
Neutro Abs: 4.2 10*3/uL (ref 1.7–7.7)
Neutrophils Relative %: 74 %
Platelets: 157 10*3/uL (ref 150–400)
RBC: 3.94 MIL/uL — ABNORMAL LOW (ref 4.22–5.81)
RDW: 14.2 % (ref 11.5–15.5)
WBC: 5.6 10*3/uL (ref 4.0–10.5)
nRBC: 0 % (ref 0.0–0.2)

## 2018-07-21 LAB — I-STAT CHEM 8, ED
BUN: 44 mg/dL — AB (ref 8–23)
CALCIUM ION: 1.04 mmol/L — AB (ref 1.15–1.40)
Chloride: 99 mmol/L (ref 98–111)
Creatinine, Ser: 1.6 mg/dL — ABNORMAL HIGH (ref 0.61–1.24)
GLUCOSE: 155 mg/dL — AB (ref 70–99)
HCT: 40 % (ref 39.0–52.0)
Hemoglobin: 13.6 g/dL (ref 13.0–17.0)
Potassium: 3.2 mmol/L — ABNORMAL LOW (ref 3.5–5.1)
SODIUM: 136 mmol/L (ref 135–145)
TCO2: 28 mmol/L (ref 22–32)

## 2018-07-21 LAB — TYPE AND SCREEN
ABO/RH(D): O POS
Antibody Screen: NEGATIVE

## 2018-07-21 LAB — PROTIME-INR
INR: 1.21
PROTHROMBIN TIME: 15.1 s (ref 11.4–15.2)

## 2018-07-21 LAB — BRAIN NATRIURETIC PEPTIDE: B Natriuretic Peptide: 703.1 pg/mL — ABNORMAL HIGH (ref 0.0–100.0)

## 2018-07-21 MED ORDER — POTASSIUM CHLORIDE 20 MEQ/15ML (10%) PO SOLN
40.0000 meq | Freq: Once | ORAL | Status: AC
  Administered 2018-07-21: 40 meq via ORAL
  Filled 2018-07-21: qty 30

## 2018-07-21 MED ORDER — FUROSEMIDE 40 MG PO TABS: 80.0000 mg | ORAL_TABLET | Freq: Two times a day (BID) | ORAL | Status: DC

## 2018-07-21 MED ORDER — METHOCARBAMOL 500 MG PO TABS: 500.0000 mg | ORAL_TABLET | Freq: Three times a day (TID) | ORAL | Status: DC | PRN

## 2018-07-21 MED ORDER — ATORVASTATIN CALCIUM 40 MG PO TABS
40.0000 mg | ORAL_TABLET | Freq: Every day | ORAL | Status: DC
  Administered 2018-07-21 – 2018-07-26 (×6): 40 mg via ORAL
  Filled 2018-07-21 (×6): qty 1

## 2018-07-21 MED ORDER — PANTOPRAZOLE SODIUM 40 MG PO TBEC
40.0000 mg | DELAYED_RELEASE_TABLET | Freq: Every day | ORAL | Status: DC
  Administered 2018-07-23 – 2018-07-27 (×5): 40 mg via ORAL
  Filled 2018-07-21 (×5): qty 1

## 2018-07-21 MED ORDER — MORPHINE SULFATE (PF) 2 MG/ML IV SOLN: 2.0000 mg | INTRAVENOUS | Status: DC | PRN

## 2018-07-21 MED ORDER — ACETAMINOPHEN 325 MG PO TABS: 325.0000 mg | ORAL_TABLET | Freq: Four times a day (QID) | ORAL | Status: DC | PRN

## 2018-07-21 MED ORDER — FERROUS SULFATE 325 (65 FE) MG PO TABS
325.0000 mg | ORAL_TABLET | Freq: Every day | ORAL | Status: DC
  Administered 2018-07-23 – 2018-07-27 (×5): 325 mg via ORAL
  Filled 2018-07-21 (×5): qty 1

## 2018-07-21 MED ORDER — VITAMIN B-12 1000 MCG PO TABS
500.0000 ug | ORAL_TABLET | Freq: Every day | ORAL | Status: DC
  Administered 2018-07-23 – 2018-07-27 (×5): 500 ug via ORAL
  Filled 2018-07-21 (×5): qty 1

## 2018-07-21 MED ORDER — CITALOPRAM HYDROBROMIDE 20 MG PO TABS
20.0000 mg | ORAL_TABLET | Freq: Every day | ORAL | Status: DC
  Administered 2018-07-23 – 2018-07-27 (×5): 20 mg via ORAL
  Filled 2018-07-21 (×5): qty 1

## 2018-07-21 MED ORDER — ASPIRIN 81 MG PO CHEW: 81.0000 mg | CHEWABLE_TABLET | Freq: Every day | ORAL | Status: DC

## 2018-07-21 MED ORDER — POLYETHYLENE GLYCOL 3350 17 G PO PACK: 17.0000 g | PACK | Freq: Every day | ORAL | Status: DC | PRN

## 2018-07-21 MED ORDER — LOSARTAN POTASSIUM 25 MG PO TABS: 12.5000 mg | ORAL_TABLET | Freq: Every day | ORAL | Status: DC

## 2018-07-21 MED ORDER — OXYCODONE-ACETAMINOPHEN 5-325 MG PO TABS: 1.0000 | ORAL_TABLET | ORAL | Status: DC | PRN

## 2018-07-21 MED ORDER — CARVEDILOL 3.125 MG PO TABS
3.1250 mg | ORAL_TABLET | Freq: Two times a day (BID) | ORAL | Status: DC
  Administered 2018-07-22 – 2018-07-27 (×9): 3.125 mg via ORAL
  Filled 2018-07-21 (×9): qty 1

## 2018-07-21 NOTE — ED Triage Notes (Signed)
Pt presents to ED from the doctors office. Pt states they told him he had a broken hip. Pt states his MD took an xray and noticed it was broken but did not know himself.

## 2018-07-21 NOTE — ED Notes (Signed)
ED TO INPATIENT HANDOFF REPORT  Name/Age/Gender [name] 67 y.o. male  Code Status    Code Status Orders  (From admission, onward)         Start     Ordered   07/21/18 1943  Full code  Continuous     07/21/18 1944        Code Status History    Date Active Date Inactive Code Status Order ID Comments User Context   06/11/2017 0538 06/12/2017 1547 Full Code [id number]  Harrie Foreman, MD Inpatient   06/10/2016 1916 06/12/2016 2340 Full Code [id number]  Maryellen Pile, MD Inpatient   05/01/2015 1514 05/02/2015 1548 Full Code [id number]  Thompson Grayer, MD Inpatient   08/03/2014 1332 08/17/2014 1636 Full Code [id number]  John Giovanni, PA-C Inpatient   07/27/2014 1646 08/03/2014 1332 Full Code [id number]  Erlene Quan, PA-C Inpatient      Home/SNF/Other Home  Chief Complaint Sent by dr  Level of Care/Admitting Diagnosis ED Disposition    ED Disposition Condition [place]: [place] [100100]  Level of Care: Telemetry [5]  Diagnosis: Closed left hip fracture [facility]) [254270]  Admitting Physician: Ivor Costa [[id number]]  Attending Physician: Ivor Costa [phone]  Estimated length of stay: past midnight tomorrow  Certification:: I certify this patient will need inpatient services for at least 2 midnights  PT Class (Do Not Modify): Inpatient [101]  PT Acc Code (Do Not Modify): Private [1]       Medical History Past Medical History:  Diagnosis Date  . Anemia   . Anxiety   . Arthritis   . Brunner's gland hyperplasia of duodenum   . CHF (congestive heart failure) ([place])   . Coronary artery disease   . External hemorrhoids   . GERD (gastroesophageal reflux disease)   . Hearing loss   . HH (hiatus hernia)   . Internal hemorrhoids   . Ischemic cardiomyopathy   . Leg fracture, left   . Myocardial infarction ([place])   . Paronychia   . Psoriasis   . PUD (peptic ulcer disease)   . Schatzki's ring   . Tubular adenoma of colon   .  Vitiligo     Allergies Allergies  Allergen Reactions  . Spironolactone Other (See Comments)    gynecomastia     IV Location/Drains/Wounds Patient Lines/Drains/Airways Status   Active Line/Drains/Airways    None          Labs/Imaging Results for orders placed or performed during the hospital encounter of 07/21/18 (from the past 48 hour(s))  Basic metabolic panel     Status: Abnormal   Collection Time: 07/21/18  6:31 PM  Result Value Ref Range   Sodium 137 135 - 145 mmol/L   Potassium 3.2 (L) 3.5 - 5.1 mmol/L   Chloride 101 98 - 111 mmol/L   CO2 25 22 - 32 mmol/L   Glucose, Bld 156 (H) 70 - 99 mg/dL   BUN 42 (H) 8 - 23 mg/dL   Creatinine, Ser 1.53 (H) 0.61 - 1.24 mg/dL   Calcium 9.2 8.9 - 10.3 mg/dL   GFR calc non Af Amer 47 (L) >60 mL/min   GFR calc Af Amer 54 (L) >60 mL/min   Anion gap 11 5 - 15    Comment: Performed at [facility], [address] [place], [place] [address]  CBC WITH DIFFERENTIAL     Status: Abnormal   Collection Time: 07/21/18  6:31 PM  Result Value Ref Range   WBC 5.6 4.0 - 10.5 K/uL   RBC 3.94 (L) 4.22 - 5.81 MIL/uL   Hemoglobin 12.9 (L) 13.0 - 17.0 g/dL   HCT 39.3 39.0 - 52.0 %   MCV 99.7 80.0 - 100.0 fL   MCH 32.7 26.0 - 34.0 pg   MCHC 32.8 30.0 - 36.0 g/dL   RDW 14.2 11.5 - 15.5 %   Platelets 157 150 - 400 K/uL   nRBC 0.0 0.0 - 0.2 %   Neutrophils Relative % 74 %   Neutro Abs 4.2 1.7 - 7.7 K/uL   Lymphocytes Relative 14 %   Lymphs Abs 0.8 0.7 - 4.0 K/uL   Monocytes Relative 9 %   Monocytes Absolute 0.5 0.1 - 1.0 K/uL   Eosinophils Relative 2 %   Eosinophils Absolute 0.1 0.0 - 0.5 K/uL   Basophils Relative 1 %   Basophils Absolute 0.0 0.0 - 0.1 K/uL   Immature Granulocytes 0 %   Abs Immature Granulocytes 0.02 0.00 - 0.07 K/uL    Comment: Performed at [facility] [address], [place], [place] [address]  Protime-INR     Status: None   Collection Time: 07/21/18  6:31 PM  Result Value Ref Range   Prothrombin Time  15.1 11.4 - 15.2 seconds   INR 1.21     Comment: Performed at [facility] [address], [place], [place] [address]  Type and screen [place]     Status: None (Preliminary result)   Collection Time: 07/21/18  7:08 PM  Result Value Ref Range   ABO/RH(D) O POS    Antibody Screen PENDING    Sample Expiration      07/24/2018 Performed at [facility], [place] [address], [place], [place] [address]   I-Stat Chem 8, ED     Status: Abnormal   Collection Time: 07/21/18  7:17 PM  Result Value Ref Range   Sodium 136 135 - 145 mmol/L   Potassium 3.2 (L) 3.5 - 5.1 mmol/L   Chloride 99 98 - 111 mmol/L   BUN 44 (H) 8 - 23 mg/dL   Creatinine, Ser 1.60 (H) 0.61 - 1.24 mg/dL   Glucose, Bld 155 (H) 70 - 99 mg/dL   Calcium, Ion 1.04 (L) 1.15 - 1.40 mmol/L   TCO2 28 22 - 32 mmol/L   Hemoglobin 13.6 13.0 - 17.0 g/dL   HCT 40.0 39.0 - 52.0 %   No results found.  Pending Labs Unresulted Labs (From admission, onward)    Start     Ordered   07/22/18 0500  Magnesium  Tomorrow morning,   R     07/21/18 1942   07/21/18 1943  Brain natriuretic peptide  Once,   R     07/21/18 1942   07/21/18 1943  HIV antibody (Routine Testing)  Once,   R     07/21/18 1944          Vitals/Pain Today's Vitals   07/21/18 1808 07/21/18 1815 07/21/18 1819  BP:  110/85   Pulse:  75   Temp:   98.1 F (36.7 C)  TempSrc:   Oral  SpO2:  95%   Weight: 79.4 kg    Height: 6' (1.829 m)    PainSc: 10-Worst pain ever      Isolation Precautions No active isolations  Medications Medications  potassium chloride 20 MEQ/15ML (10%) solution 40 mEq (has no administration in time range)  methocarbamol (  ROBAXIN) tablet 500 mg (has no administration in time range)  oxyCODONE-acetaminophen (PERCOCET/ROXICET) 5-325 MG per tablet 1 tablet (has no administration in time range)  morphine 2 MG/ML injection 2 mg (has no administration in time range)  polyethylene glycol (MIRALAX / GLYCOLAX) packet  17 g (has no administration in time range)    Mobility walks

## 2018-07-21 NOTE — ED Provider Notes (Signed)
[facility] EMERGENCY DEPARTMENT Provider Note   CSN: [id number] Arrival date & time: 07/21/18  1744     History   Chief Complaint No chief complaint on file.   HPI Carl [name] is a 67 y.o. male for evaluation of left hip fracture.  Patient states he has been having left leg pain for a while.  He fell a week and a half ago, but did not think he injured anything.  He was not evaluated at that time.  Today he was at the orthopedic doctor's office for a 1 month follow-up during which he got hip x-rays.  It was found that he had a fractured left hip.  Patient was instructed not to weight-bear on his hip, and could come to the emergency room with a plan for surgery tomorrow.  Patient states that when he fell he did not hit his head or lose consciousness.  He is not on blood thinners.  He denies new or worsening pain. He denies HA, cp, sob, n/v, abd pain, numbness.   HPI  Past Medical History:  Diagnosis Date  . Anemia   . Anxiety   . Arthritis   . Brunner's gland hyperplasia of duodenum   . CHF (congestive heart failure) ([place])   . Coronary artery disease   . External hemorrhoids   . GERD (gastroesophageal reflux disease)   . Hearing loss   . HH (hiatus hernia)   . Internal hemorrhoids   . Ischemic cardiomyopathy   . Leg fracture, left   . Myocardial infarction ([place])   . Paronychia   . Psoriasis   . PUD (peptic ulcer disease)   . Schatzki's ring   . Tubular adenoma of colon   . Vitiligo     Patient Active Problem List   Diagnosis Date Noted  . CAD (coronary artery disease) 07/21/2018  . Chronic systolic CHF (congestive heart failure) ([place]) 07/21/2018  . GERD (gastroesophageal reflux disease) 07/21/2018  . Depression 07/21/2018  . CKD (chronic kidney disease), stage III ([place]) 07/21/2018  . Iron deficiency anemia 07/21/2018  . Hypokalemia 07/21/2018  . Closed left hip fracture ([facility]) 07/21/2018  . Lower extremity pain, bilateral 11/12/2017  .  Hyperlipidemia 09/01/2017  . Essential hypertension 09/01/2017  . Prostate cancer screening 04/28/2017  . Hydrocele 04/28/2017  . Bradycardia   . Premature ventricular contraction   . Systolic dysfunction with acute on chronic heart failure ([place])   . Non-rheumatic mitral regurgitation   . Chronic systolic dysfunction of left ventricle 05/01/2015  . Cardiomyopathy, ischemic 04/26/2015  . Difficulty hearing 12/29/2014  . Acute on chronic systolic CHF (congestive heart failure) ([place]) 10/10/2014  . 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 Acute systolic heart failure ([place]) 08/01/2014  . Protein-calorie malnutrition, severe ([place]) 07/29/2014  . ST elevation myocardial infarction (STEMI) of anterolateral wall ([place]) 07/29/2014  . Cardiogenic shock ([place]) 07/29/2014  . Tobacco use 07/29/2014  . AKI (acute kidney injury) ([place]) 07/29/2014    Past Surgical History:  Procedure Laterality Date  . CARDIAC SURGERY    . COLONOSCOPY WITH ESOPHAGOGASTRODUODENOSCOPY (EGD)    . COLONOSCOPY WITH PROPOFOL N/A 08/24/2017   Procedure: COLONOSCOPY WITH PROPOFOL;  Surgeon: [name], MD;  [facility] END[facility];  Service: Endoscopy;  Laterality: N/A;  . CORONARY ARTERY BYPASS GRAFT N/A 08/03/2014   Procedure: CORONARY ARTERY BYPASS GRAFTING (CABG);  Surgeon: [name], MD;  Location: [facility];  Service: Open Heart Surgery;  Laterality: N/A;  . EP IMPLANTABLE DEVICE N/A  05/01/2015   MDT ICD implanted for primary prevention of sudden death  . ESOPHAGOGASTRODUODENOSCOPY (EGD) WITH PROPOFOL N/A 04/16/2015   Procedure: ESOPHAGOGASTRODUODENOSCOPY (EGD) WITH PROPOFOL;  Surgeon: [name], MD;  Location[facility] ENDOSCOPY;  Service: Endoscopy;  Laterality: N/A;  . ESOPHAGOGASTRODUODENOSCOPY (EGD) WITH PROPOFOL N/A 08/24/2017   Procedure: ESOPHAGOGASTRODUODENOSCOPY (EGD) WITH PROPOFOL;  Surgeon: [name], MD;  Location: [facility] ENDOSCOPY;  Service: Endoscopy;  Laterality: N/A;  . FRACTURE SURGERY    . HERNIA  REPAIR    . TEE WITHOUT CARDIOVERSION N/A 08/03/2014   Procedure: TRANSESOPHAGEAL ECHOCARDIOGRAM (TEE);  Surgeon: [name], MD;  Location: [facility];  Service: Open Heart Surgery;  Laterality: N/A;  . TEE WITHOUT CARDIOVERSION N/A 02/10/2018   Procedure: TRANSESOPHAGEAL ECHOCARDIOGRAM (TEE);  Surgeon: [name], MD;  Location: [facility] ENDOSCOPY;  Service: Cardiovascular;  Laterality: N/A;        Home Medications    Prior to Admission medications   Medication Sig Start Date End Date Taking? Authorizing Provider  acetaminophen (TYLENOL) 325 MG tablet Take 325 mg by mouth every 6 (six) hours as needed.    [provider]  aspirin 81 MG EC tablet Take 81 mg by mouth daily. 09/21/17   [provider]  atorvastatin (LIPITOR) 40 MG tablet Take 1 tablet (40 mg total) by mouth daily at 6 PM. 08/15/14   Gold, Patrick Jupiter E, PA-C  carvedilol (COREG) 3.125 MG tablet Take 3.125 mg by mouth 2 (two) times daily with a meal.    [provider]  citalopram (CELEXA) 20 MG tablet Take 20 mg by mouth daily.  07/09/16   [provider]  cyanocobalamin 500 MCG tablet Take 500 mcg by mouth daily.    [provider]  cyclobenzaprine (FLEXERIL) 10 MG tablet Take 10 mg by mouth as needed. 02/04/18   [provider]  eplerenone (INSPRA) 25 MG tablet Take 1 tablet (25 mg total) by mouth daily. 01/11/18   [name], MD  ferrous sulfate 325 (65 FE) MG tablet Take 1 tablet (325 mg total) by mouth daily with breakfast. 02/09/18   [name], MD  furosemide (LASIX) 80 MG tablet TAKE 1 TABLET (80 MG TOTAL) BY MOUTH 2 (TWO) TIMES DAILY. 05/11/18   Bensimhon, Shaune Pascal, MD  ibuprofen (ADVIL,MOTRIN) 600 MG tablet Take 1 tablet (600 mg total) by mouth every 8 (eight) hours as needed. Patient not taking: Reported on 06/07/2018 02/14/18   Darel Hong, MD  KLOR-CON M20 20 MEQ tablet TAKE 1 TABLET (20 MEQ TOTAL) 2 (TWO) TIMES DAILY BY MOUTH. Patient not taking: Reported  on 07/13/2018 06/14/18   Darrick Grinder D, NP  losartan (COZAAR) 25 MG tablet Take 0.5 tablets (12.5 mg total) daily by mouth. 07/04/17   Theora Gianotti, NP  metolazone (ZAROXOLYN) 2.5 MG tablet Take 1 tablet (2.5 mg total) by mouth once a week. Wednesday mornings. Take 1 EXTRA Potassium tablet with this. 07/02/18 09/30/18  Clegg, Amy D, NP  omeprazole (PRILOSEC) 20 MG capsule Take 20 mg by mouth 2 (two) times daily.    [provider]  potassium chloride SA (KLOR-CON M20) 20 MEQ tablet Take 1 tablet (20 mEq total) by mouth 2 (two) times daily. Take 1 EXTRA tablet on Wednesday mornings with Metolazone. 07/01/18   [name], MD  UNABLE TO FIND Antacid-Antigas 200 mg-200 mg-20 mg/5 mL oral suspension  TAKE 2 TABLESPOONSFUL (30MLS) BY MOUTH 4 TIMES A DAY BEFORE MEALS AND AT BEDTIME 08/13/17  [provider]    Family History Family History  Problem Relation Age of Onset  . Valvular heart disease Mother        Ruptured valve  . CAD Father   . Heart Problems Brother        Stents x 4  . Diabetes Brother   . Prostate cancer Neg Hx   . Bladder Cancer Neg Hx   . Kidney cancer Neg Hx     Social History Social History   Tobacco Use  . Smoking status: Former Smoker    Last attempt to quit: 2014    Years since quitting: 5.9  . Smokeless tobacco: Never Used  . Tobacco comment: 07/30/2017 Quit in 2014  Substance Use Topics  . Alcohol use: No    Alcohol/week: 0.0 standard drinks  . Drug use: No     Allergies   Spironolactone   Review of Systems Review of Systems  Musculoskeletal: Positive for arthralgias.  All other systems reviewed and are negative.    Physical Exam Updated Vital Signs BP 110/85   Pulse 75   Temp 98.1 F (36.7 C) (Oral)   Ht 6' (1.829 m)   Wt 79.4 kg   SpO2 95%   BMI 23.73 kg/m   Physical Exam  Constitutional: He is oriented to person, place, and time. He appears well-developed and well-nourished. No distress.  Elderly  male sitting comfortably in the bed in no acute distress  HENT:  Head: Normocephalic and atraumatic.  Eyes: Pupils are equal, round, and reactive to light. Conjunctivae and EOM are normal.  Neck: Normal range of motion. Neck supple.  Cardiovascular: Normal rate, regular rhythm and intact distal pulses.  Pulmonary/Chest: Effort normal and breath sounds normal. No respiratory distress. He has no wheezes.  Abdominal: Soft. He exhibits no distension and no mass. There is no tenderness. There is no rebound and no guarding.  Musculoskeletal: Normal range of motion. He exhibits edema.  TTP of L lateral hip. swelling noted of the left leg.  Pedal pulses intact bilaterally.  Sensation intact bilaterally.  Neurological: He is alert and oriented to person, place, and time. No sensory deficit.  Skin: Skin is warm and dry. Capillary refill takes less than 2 seconds.  Psychiatric: He has a normal mood and affect.  Nursing note and vitals reviewed.     ED Treatments / Results  Labs (all labs ordered are listed, but only abnormal results are displayed) Labs Reviewed  CBC WITH DIFFERENTIAL/PLATELET - Abnormal; Notable for the following components:      Result Value   RBC 3.94 (*)    Hemoglobin 12.9 (*)    All other components within normal limits  I-STAT CHEM 8, ED - Abnormal; Notable for the following components:   Potassium 3.2 (*)    BUN 44 (*)    Creatinine, Ser 1.60 (*)    Glucose, Bld 155 (*)    Calcium, Ion 1.04 (*)    All other components within normal limits  PROTIME-INR  BASIC METABOLIC PANEL  MAGNESIUM  BRAIN NATRIURETIC PEPTIDE  HIV ANTIBODY (ROUTINE TESTING W REFLEX)  TYPE AND SCREEN    EKG None  Radiology No results found.  Procedures Procedures (including critical care time)  Medications Ordered in ED Medications  potassium chloride 20 MEQ/15ML (10%) solution 40 mEq (has no administration in time range)  methocarbamol (ROBAXIN) tablet 500 mg (has no administration  in time range)  oxyCODONE-acetaminophen (PERCOCET/ROXICET) 5-325 MG per tablet 1 tablet (has no administration in  time range)  morphine 2 MG/ML injection 2 mg (has no administration in time range)  polyethylene glycol (MIRALAX / GLYCOLAX) packet 17 g (has no administration in time range)     Initial Impression / Assessment and Plan / ED Course  I have reviewed the triage vital signs and the nursing notes.  Pertinent labs & imaging results that were available during my care of the patient were reviewed by me and considered in my medical decision making (see chart for details).     Patient presenting for evaluation of hip fracture.  Physical exam shows patient is neurovascularly intact.  X-rays performed at orthopedic clinic, will not repeat today.  Will order preop clearance labs and x-rays.  Discussed with Henrene Pastor from [facility], who states patient is scheduled for surgery tomorrow with Dr. Mardelle Matte, and the hospitalist should already have been notified that patient is ready for admission.  Discussed with Dr. Blaine Hamper from [facility], pt to be admitted.    Final Clinical Impressions(s) / ED Diagnoses   Final diagnoses:  Closed fracture of left hip, initial encounter [facility])    ED Discharge Orders    None       Franchot Heidelberg, PA-C 07/21/18 1950    Julianne Rice, MD 07/21/18 [phone]

## 2018-07-21 NOTE — ED Notes (Addendum)
Pt is ambulatory to bed. Pt states he feels no pan in his left hip although his MD told him it was broken. Pt A&Ox4
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
## 2018-07-21 NOTE — ED Notes (Signed)
No noticeable bruising to left hip but there are discolorations to the left knee

## 2018-07-21 NOTE — H&P (Signed)
History and Physical    Carl [name]:[id number] DOB: May 01, 1951 DOA: 07/21/2018  Referring MD/NP/PA:   PCP: [name], MD   Patient coming from:  The patient is coming from home.  At baseline, pt is independent for most of ADL.        Chief Complaint: left hip pain  HPI: Carl [name] is a 67 y.o. male with medical history significant of hypertension, hyperlipidemia, GERD, depression, anemia, CHF with EF 25%, AICD, CAD, CABG, psoriasis, peptic ulcer disease, vitiligo, CKD 3, who presents with left hip pain.  Pt states that he fell 10 days ago when he walked to front door and tripped. He had mild injury to his left leg. He developed mild pain in the left upper thigh and left hip. He was not evaluated at that time.  Patient denies LOC, strongly denies head or neck injury.  No headache or neck pain.  He states that he has persistent mild pain in the left hip and left upper thigh. He states that his left leg seems to be shorter. No leg numbness or tingling. Today he was at the orthopedic doctor's office for a 1 month follow-up during which he got hip x-rays.  It was found that he had a fractured left hip. Patient was instructed not to weight-bear on his hip, and could come to the emergency room with a plan for surgery tomorrow. Per EDP, Dr. Mardelle Matte is planning to do surgery for him tomorrow. Patient denies any chest pain, shortness of breath, cough, fever or chills.  No nausea, vomiting, diarrhea or abdominal pain.  No symptoms of UTI.  ED Course: pt was found to have WBC 5.6, slightly worsening renal function, temperature normal, no tachycardia, oxygen saturation 95% on room air.  Chest x-ray with cardiomegaly, vascular congestion, mild interstitial edema.  Patient is admitted to telemetry bed as inpatient.  Review of Systems:   General: no fevers, chills, no body weight gain, has fatigue HEENT: no blurry vision, hearing changes or sore throat Respiratory: no dyspnea, coughing,  wheezing CV: no chest pain, no palpitations GI: no nausea, vomiting, abdominal pain, diarrhea, constipation GU: no dysuria, burning on urination, increased urinary frequency, hematuria  Ext: no leg edema Neuro: no unilateral weakness, numbness, or tingling, no vision change or hearing loss Skin: no rash, no skin tear. MSK: has pain in left hip and left upper thigh Heme: No easy bruising.  Travel history: No recent long distant travel.  Allergy:  Allergies  Allergen Reactions  . Spironolactone Other (See Comments)    gynecomastia     Past Medical History:  Diagnosis Date  . Anemia   . Anxiety   . Arthritis   . Brunner's gland hyperplasia of duodenum   . CHF (congestive heart failure) ([place])   . Coronary artery disease   . External hemorrhoids   . GERD (gastroesophageal reflux disease)   . Hearing loss   . HH (hiatus hernia)   . Internal hemorrhoids   . Ischemic cardiomyopathy   . Leg fracture, left   . Myocardial infarction ([place])   . Paronychia   . Psoriasis   . PUD (peptic ulcer disease)   . Schatzki's ring   . Tubular adenoma of colon   . Vitiligo     Past Surgical History:  Procedure Laterality Date  . CARDIAC SURGERY    . COLONOSCOPY WITH ESOPHAGOGASTRODUODENOSCOPY (EGD)    . COLONOSCOPY WITH PROPOFOL N/A 08/24/2017   Procedure: COLONOSCOPY WITH PROPOFOL;  Surgeon: Gaylyn Cheers  T, MD;  Location: [facility] ENDOSCOPY;  Service: Endoscopy;  Laterality: N/A;  . CORONARY ARTERY BYPASS GRAFT N/A 08/03/2014   Procedure: CORONARY ARTERY BYPASS GRAFTING (CABG);  Surgeon: [name], MD;  Location: [place];  Service: Open Heart Surgery;  Laterality: N/A;  . EP IMPLANTABLE DEVICE N/A 05/01/2015   MDT ICD implanted for primary prevention of sudden death  . ESOPHAGOGASTRODUODENOSCOPY (EGD) WITH PROPOFOL N/A 04/16/2015   Procedure: ESOPHAGOGASTRODUODENOSCOPY (EGD) WITH PROPOFOL;  Surgeon: [name], MD;  Location: [facility] ENDOSCOPY;  Service: Endoscopy;  Laterality: N/A;    . ESOPHAGOGASTRODUODENOSCOPY (EGD) WITH PROPOFOL N/A 08/24/2017   Procedure: ESOPHAGOGASTRODUODENOSCOPY (EGD) WITH PROPOFOL;  Surgeon: [name], MD;  Location: [facility] ENDOSCOPY;  Service: Endoscopy;  Laterality: N/A;  . FRACTURE SURGERY    . HERNIA REPAIR    . TEE WITHOUT CARDIOVERSION N/A 08/03/2014   Procedure: TRANSESOPHAGEAL ECHOCARDIOGRAM (TEE);  Surgeon: [name], MD;  Location: [place];  Service: Open Heart Surgery;  Laterality: N/A;  . TEE WITHOUT CARDIOVERSION N/A 02/10/2018   Procedure: TRANSESOPHAGEAL ECHOCARDIOGRAM (TEE);  Surgeon: [name], MD;  Location: [facility] ENDOSCOPY;  Service: Cardiovascular;  Laterality: N/A;    Social History:  reports that he quit smoking about 5 years ago. He has never used smokeless tobacco. He reports that he does not drink alcohol or use drugs.  Family History:  Family History  Problem Relation Age of Onset  . Valvular heart disease Mother        Ruptured valve  . CAD Father   . Heart Problems Brother        Stents x 4  . Diabetes Brother   . Prostate cancer Neg Hx   . Bladder Cancer Neg Hx   . Kidney cancer Neg Hx      Prior to Admission medications   Medication Sig Start Date End Date Taking? Authorizing Provider  acetaminophen (TYLENOL) 325 MG tablet Take 325 mg by mouth every 6 (six) hours as needed.    [provider]  aspirin 81 MG EC tablet Take 81 mg by mouth daily. 09/21/17   [provider]  atorvastatin (LIPITOR) 40 MG tablet Take 1 tablet (40 mg total) by mouth daily at 6 PM. 08/15/14   Gold, Patrick Jupiter E, PA-C  carvedilol (COREG) 3.125 MG tablet Take 3.125 mg by mouth 2 (two) times daily with a meal.    [provider]  citalopram (CELEXA) 20 MG tablet Take 20 mg by mouth daily.  07/09/16   [provider]  cyanocobalamin 500 MCG tablet Take 500 mcg by mouth daily.    [provider]  cyclobenzaprine (FLEXERIL) 10 MG tablet Take 10 mg by mouth as needed. 02/04/18   [provider]  eplerenone (INSPRA) 25 MG tablet Take 1 tablet (25 mg total) by mouth daily. 01/11/18   [name], MD  ferrous sulfate 325 (65 FE) MG tablet Take 1 tablet (325 mg total) by mouth daily with breakfast. 02/09/18   [name], MD  furosemide (LASIX) 80 MG tablet TAKE 1 TABLET (80 MG TOTAL) BY MOUTH 2 (TWO) TIMES DAILY. 05/11/18   Bensimhon, Shaune Pascal, MD  ibuprofen (ADVIL,MOTRIN) 600 MG tablet Take 1 tablet (600 mg total) by mouth every 8 (eight) hours as needed. Patient not taking: Reported on 06/07/2018 02/14/18   Darel Hong, MD  KLOR-CON M20 20 MEQ tablet TAKE 1 TABLET (20 MEQ TOTAL) 2 (TWO) TIMES DAILY BY MOUTH. Patient not taking: Reported on 07/13/2018 06/14/18  Clegg, Amy D, NP  losartan (COZAAR) 25 MG tablet Take 0.5 tablets (12.5 mg total) daily by mouth. 07/04/17   Theora Gianotti, NP  metolazone (ZAROXOLYN) 2.5 MG tablet Take 1 tablet (2.5 mg total) by mouth once a week. Wednesday mornings. Take 1 EXTRA Potassium tablet with this. 07/02/18 09/30/18  Clegg, Amy D, NP  omeprazole (PRILOSEC) 20 MG capsule Take 20 mg by mouth 2 (two) times daily.    [provider]  potassium chloride SA (KLOR-CON M20) 20 MEQ tablet Take 1 tablet (20 mEq total) by mouth 2 (two) times daily. Take 1 EXTRA tablet on Wednesday mornings with Metolazone. 07/01/18   [name], MD  UNABLE TO FIND Antacid-[place] 200 mg-200 mg-20 mg/5 mL oral suspension  TAKE 2 TABLESPOONSFUL (30MLS) BY MOUTH 4 TIMES A DAY BEFORE MEALS AND AT BEDTIME 08/13/17   [provider]    Physical Exam: Vitals:   07/21/18 1808 07/21/18 1815 07/21/18 1819 07/21/18 2049  BP:  110/85  103/79  Pulse:  75  67  Resp:    16  Temp:   98.1 F (36.7 C) 98.4 F (36.9 C)  TempSrc:   Oral Oral  SpO2:  95%  100%  Weight: 79.4 kg     Height: 6' (1.829 m)      General: Not in acute distress HEENT:       Eyes: PERRL, EOMI, no scleral icterus.       ENT: No discharge from the ears and  nose, no pharynx injection, no tonsillar enlargement.        Neck: No JVD, no bruit, no mass felt. Heme: No neck lymph node enlargement. Cardiac: S1/S2, RRR, No murmurs, No gallops or rubs. Respiratory: No rales, wheezing, rhonchi or rubs. GI: Soft, nondistended, nontender, no rebound pain, no organomegaly, BS present. GU: No hematuria Ext: No pitting leg edema bilaterally. 2+DP/PT pulse bilaterally. Musculoskeletal: has tenderness in left upper thigh and left hip, the left leg is slightly shortened. Skin: No rashes.  Neuro: Alert, oriented X3, cranial nerves II-XII grossly intact, moves all extremities. Psych: Patient is not psychotic, no suicidal or hemocidal ideation.  Labs on Admission: I have personally reviewed following labs and imaging studies  CBC: Recent Labs  Lab 07/21/18 1831 07/21/18 1917  WBC 5.6  --   NEUTROABS 4.2  --   HGB 12.9* 13.6  HCT 39.3 40.0  MCV 99.7  --   PLT 157  --    Basic Metabolic Panel: Recent Labs  Lab 07/21/18 1831 07/21/18 1917  NA 137 136  K 3.2* 3.2*  CL 101 99  CO2 25  --   GLUCOSE 156* 155*  BUN 42* 44*  CREATININE 1.53* 1.60*  CALCIUM 9.2  --    GFR: Estimated Creatinine Clearance: 49.8 mL/min (A) (by C-G formula based on SCr of 1.6 mg/dL (H)). Liver Function Tests: No results for input(s): AST, ALT, ALKPHOS, BILITOT, PROT, ALBUMIN in the last 168 hours. No results for input(s): LIPASE, AMYLASE in the last 168 hours. No results for input(s): AMMONIA in the last 168 hours. Coagulation Profile: Recent Labs  Lab 07/21/18 1831  INR 1.21   Cardiac Enzymes: No results for input(s): CKTOTAL, CKMB, CKMBINDEX, TROPONINI in the last 168 hours. BNP (last 3 results) No results for input(s): PROBNP in the last 8760 hours. HbA1C: No results for input(s): HGBA1C in the last 72 hours. CBG: No results for input(s): GLUCAP in the last 168 hours. Lipid Profile: No results for input(s): CHOL,  HDL, LDLCALC, TRIG, CHOLHDL, LDLDIRECT in  the last 72 hours. Thyroid Function Tests: No results for input(s): TSH, T4TOTAL, FREET4, T3FREE, THYROIDAB in the last 72 hours. Anemia Panel: No results for input(s): VITAMINB12, FOLATE, FERRITIN, TIBC, IRON, RETICCTPCT in the last 72 hours. Urine analysis:    Component Value Date/Time   COLORURINE STRAW (A) 06/11/2017 0555   APPEARANCEUR CLEAR (A) 06/11/2017 0555   APPEARANCEUR Clear 04/28/2017 1259   LABSPEC 1.014 06/11/2017 0555   PHURINE 5.0 06/11/2017 [place] 06/11/2017 0555   HGBUR NEGATIVE 06/11/2017 [place] 06/11/2017 0555   BILIRUBINUR Negative 04/28/2017 1259   [place] 06/11/2017 0555   PROTEINUR NEGATIVE 06/11/2017 0555   NITRITE NEGATIVE 06/11/2017 0555   LEUKOCYTESUR NEGATIVE 06/11/2017 0555   LEUKOCYTESUR Negative 04/28/2017 1259   Sepsis Labs: @LABRCNTIP (procalcitonin:4,lacticidven:4) )No results found for this or any previous visit (from the past 240 hour(s)).   Radiological Exams on Admission: No results found.   EKG: Independently reviewed.  Sinus rhythm, QTC 471, LAE, Q waves in lead III/aVF, poor R wave progression, anteroseptal infarction pattern.   Assessment/Plan Principal Problem:   Closed left hip fracture (HCC) Active Problems:   S/P CABG x 5   Hyperlipidemia   Essential hypertension   CAD (coronary artery disease)   Chronic systolic CHF (congestive heart failure) (HCC)   GERD (gastroesophageal reflux disease)   Depression   CKD (chronic kidney disease), stage III (HCC)   Iron deficiency anemia   Hypokalemia   Closed left hip fracture ([place]): EDP discussed with Henrene Pastor from [facility] office, "who states patient is scheduled for surgery tomorrow with Dr. Mardelle Matte". Patient has mild pain now. No neurovascular compromise.  - will admit to tele bed as inpt - Pain control: morphine prn and percocet - When necessary Zofran for nausea - Robaxin for muscle spasm - type and cross - INR/PTT -  PT/OT when able to (not ordered now)  Perioperative Cardiac Risk: He has multiple comorbidities, including CHF with EF 25%. CAD, history of CABG, AICD placement and CKD-III.  Currently patient is active and independent of his ADLs, IADLs. He is on diuretics, Coreg, aspirin and Lipitor at home. No recent acute cardiac issues.  Patient does not have chest pain, shortness of breath, palpitation, leg edema.  No signs of acute CHF exacerbation currently.  EKG has no acute change. At this time point, no further work up is needed. His GUPTA score perioperative myocardial infarction or cardaic arrest is moderate at 1.14 %.  I discussed the risk with patient and his brother.  They requested to get cardiology consult.  His cardiologist Dr. [name] -Cardio consult was requested via Epic  Chronic systolic CHF: 2D echo on 5/63/1497 showed EF 25-30%.  Patient does not have leg edema or JVD.  No chest pain or shortness of breath.  CHF seems to be compensated.  Patient is on Lasix, metolazone and eplerenone. He is on weekly metolazone, last dose was today. -Continue Lasix 80 mg twice daily -Patient is allergic to spironolactone, cannot switch eplerenone to spironolactone.  Hypokalemia: K= 3.2 on admission. - Repleted - Check Mg level  Hyperlipidemia: -Lipitor  HTN:  -Continue home medications: Coreg, Cozaar -IV hydralazine prn  CAD (coronary artery disease): s/p of CABG. No CP. -Aspirin, Lipitor, Coreg  GERD: -Protonix  Depression: -Continue home medications, Celexa  CKD (chronic kidney disease), stage III ([place]): Slightly worsening.  Baseline creatinine 1.3-1.5.  His creatinine is 1.6, BUN 44 on admission. -Hold off  ibuprofen -Follow-up BMP  Iron deficiency anemia: Hemoglobin stable, 12.9. -Continue iron supplement   Hypokalemia   Inpatient status:  # Patient requires inpatient status due to high intensity of service, high risk for further deterioration and high frequency of surveillance  required.  I certify that at the point of admission it is my clinical judgment that the patient will require inpatient hospital care spanning beyond 2 midnights from the point of admission.  . This patient has multiple chronic comorbidities including hypertension, hyperlipidemia, GERD, depression, anemia, CHF with EF 25%, AICD, CAD, CABG, psoriasis, peptic ulcer disease, vitiligo, CKD 3. . Now patient has presenting symptoms include left hip and upper thigh pain . The worrisome physical exam findings include tenderness in left upper thigh and left hip . The initial radiographic and laboratory data are worrisome because of left hip fracuture . Current medical needs: please see my assessment and plan . Predictability of an adverse outcome (risk): pt will need surgery fixation of left hip fracture.  Patient has multiple comorbidities, including CHF with EF 25%, at high risk of developing complication.  Patient will need to stay in hospital for at least 2 days.    DVT ppx: SCD Code Status: Full code Family Communication:  Yes, patient's brother  at bed side Disposition Plan:  Anticipate discharge back to previous home environment Consults called: Dr. Mardelle Matte of ortho is planning to do surgery tomorrow per pt.  Admission status:  Inpatient/tele       Date of Service 07/21/2018    Ivor Costa [facility] Pager [phone]  If 7PM-7AM, please contact night-coverage [email or web address] Password [facility] 07/21/2018, 9:29 PM

## 2018-07-22 ENCOUNTER — Inpatient Hospital Stay (HOSPITAL_COMMUNITY): Payer: Medicare Other

## 2018-07-22 ENCOUNTER — Inpatient Hospital Stay (HOSPITAL_COMMUNITY): Payer: Medicare Other | Admitting: Anesthesiology

## 2018-07-22 ENCOUNTER — Encounter (HOSPITAL_COMMUNITY)
Admission: EM | Disposition: A | Discharge status: Skilled Nursing Facility | Payer: Self-pay | Source: Home / Self Care | Attending: Internal Medicine

## 2018-07-22 ENCOUNTER — Inpatient Hospital Stay: Admit: 2018-07-22 | Payer: Medicare Other | Admitting: Orthopedic Surgery

## 2018-07-22 HISTORY — DX: Fracture of unspecified part of neck of left femur, initial encounter for closed fracture: S72.002A

## 2018-07-22 HISTORY — PX: HIP PINNING,CANNULATED: SHX1758

## 2018-07-22 LAB — SURGICAL PCR SCREEN
MRSA, PCR: NEGATIVE
Staphylococcus aureus: NEGATIVE

## 2018-07-22 LAB — MAGNESIUM: Magnesium: 1.8 mg/dL (ref 1.7–2.4)

## 2018-07-22 SURGERY — FIXATION, FEMUR, NECK, PERCUTANEOUS, USING SCREW
Anesthesia: General | Site: Hip | Laterality: Left

## 2018-07-22 MED ORDER — HYDROCODONE-ACETAMINOPHEN 5-325 MG PO TABS
1.0000 | ORAL_TABLET | ORAL | Status: DC | PRN
  Filled 2018-07-22: qty 1

## 2018-07-22 MED ORDER — ACETAMINOPHEN 500 MG PO TABS
500.0000 mg | ORAL_TABLET | Freq: Four times a day (QID) | ORAL | Status: AC
  Administered 2018-07-22 – 2018-07-23 (×4): 500 mg via ORAL
  Filled 2018-07-22 (×4): qty 1

## 2018-07-22 MED ORDER — 0.9 % SODIUM CHLORIDE (POUR BTL) OPTIME
TOPICAL | Status: DC | PRN
  Administered 2018-07-22: 1000 mL

## 2018-07-22 MED ORDER — MIDAZOLAM HCL 2 MG/2ML IJ SOLN
INTRAMUSCULAR | Status: AC
  Filled 2018-07-22: qty 2

## 2018-07-22 MED ORDER — POTASSIUM CHLORIDE IN NACL 20-0.9 MEQ/L-% IV SOLN
INTRAVENOUS | Status: DC
  Administered 2018-07-22: 12:00:00 via INTRAVENOUS
  Filled 2018-07-22: qty 1000

## 2018-07-22 MED ORDER — FENTANYL CITRATE (PF) 250 MCG/5ML IJ SOLN
INTRAMUSCULAR | Status: DC | PRN
  Administered 2018-07-22 (×2): 50 ug via INTRAVENOUS
  Administered 2018-07-22 (×2): 25 ug via INTRAVENOUS

## 2018-07-22 MED ORDER — DEXAMETHASONE SODIUM PHOSPHATE 10 MG/ML IJ SOLN
INTRAMUSCULAR | Status: DC | PRN
  Administered 2018-07-22: 5 mg via INTRAVENOUS

## 2018-07-22 MED ORDER — FENTANYL CITRATE (PF) 100 MCG/2ML IJ SOLN
25.0000 ug | INTRAMUSCULAR | Status: DC | PRN
  Administered 2018-07-22: 50 ug via INTRAVENOUS

## 2018-07-22 MED ORDER — LACTATED RINGERS IV SOLN
INTRAVENOUS | Status: DC | PRN
  Administered 2018-07-22: 08:00:00 via INTRAVENOUS

## 2018-07-22 MED ORDER — HYDROCODONE-ACETAMINOPHEN 7.5-325 MG PO TABS
1.0000 | ORAL_TABLET | ORAL | Status: DC | PRN
  Administered 2018-07-22: 2 via ORAL
  Administered 2018-07-23 – 2018-07-24 (×4): 1 via ORAL
  Filled 2018-07-22 (×2): qty 1
  Filled 2018-07-22 (×2): qty 2
  Filled 2018-07-22 (×2): qty 1

## 2018-07-22 MED ORDER — HYDROMORPHONE HCL 1 MG/ML IJ SOLN
0.2500 mg | INTRAMUSCULAR | Status: DC | PRN
  Administered 2018-07-22: 0.5 mg via INTRAVENOUS

## 2018-07-22 MED ORDER — LIDOCAINE 2% (20 MG/ML) 5 ML SYRINGE
INTRAMUSCULAR | Status: DC | PRN
  Administered 2018-07-22: 60 mg via INTRAVENOUS

## 2018-07-22 MED ORDER — BUPIVACAINE HCL (PF) 0.25 % IJ SOLN
INTRAMUSCULAR | Status: AC
  Filled 2018-07-22: qty 20

## 2018-07-22 MED ORDER — ENOXAPARIN SODIUM 30 MG/0.3ML ~~LOC~~ SOLN: 30.0000 mg | SUBCUTANEOUS | 0 refills | Status: DC

## 2018-07-22 MED ORDER — ACETAMINOPHEN 325 MG PO TABS: 325.0000 mg | ORAL_TABLET | Freq: Four times a day (QID) | ORAL | Status: DC | PRN

## 2018-07-22 MED ORDER — PHENYLEPHRINE HCL 10 MG/ML IJ SOLN
INTRAMUSCULAR | Status: DC | PRN
  Administered 2018-07-22: 80 ug via INTRAVENOUS

## 2018-07-22 MED ORDER — BACLOFEN 10 MG PO TABS: 10.0000 mg | ORAL_TABLET | Freq: Three times a day (TID) | ORAL | 0 refills | Status: DC

## 2018-07-22 MED ORDER — MIDAZOLAM HCL 2 MG/2ML IJ SOLN
INTRAMUSCULAR | Status: DC | PRN
  Administered 2018-07-22: 1 mg via INTRAVENOUS

## 2018-07-22 MED ORDER — ONDANSETRON HCL 4 MG/2ML IJ SOLN: 4.0000 mg | Freq: Once | INTRAMUSCULAR | Status: DC | PRN

## 2018-07-22 MED ORDER — DOCUSATE SODIUM 100 MG PO CAPS
100.0000 mg | ORAL_CAPSULE | Freq: Two times a day (BID) | ORAL | Status: DC
  Administered 2018-07-22 – 2018-07-27 (×8): 100 mg via ORAL
  Filled 2018-07-22 (×11): qty 1

## 2018-07-22 MED ORDER — PHENOL 1.4 % MT LIQD: 1.0000 | OROMUCOSAL | Status: DC | PRN

## 2018-07-22 MED ORDER — ENOXAPARIN SODIUM 30 MG/0.3ML ~~LOC~~ SOLN
30.0000 mg | SUBCUTANEOUS | Status: DC
  Administered 2018-07-23 – 2018-07-26 (×4): 30 mg via SUBCUTANEOUS
  Filled 2018-07-22 (×4): qty 0.3

## 2018-07-22 MED ORDER — CEFAZOLIN SODIUM-DEXTROSE 2-3 GM-%(50ML) IV SOLR
INTRAVENOUS | Status: DC | PRN
  Administered 2018-07-22: 2 g via INTRAVENOUS

## 2018-07-22 MED ORDER — MAGNESIUM CITRATE PO SOLN: 1.0000 | Freq: Once | ORAL | Status: DC | PRN

## 2018-07-22 MED ORDER — FENTANYL CITRATE (PF) 250 MCG/5ML IJ SOLN
INTRAMUSCULAR | Status: AC
  Filled 2018-07-22: qty 5

## 2018-07-22 MED ORDER — HYDROCODONE-ACETAMINOPHEN 5-325 MG PO TABS: 1.0000 | ORAL_TABLET | Freq: Four times a day (QID) | ORAL | 0 refills | Status: DC | PRN

## 2018-07-22 MED ORDER — MENTHOL 3 MG MT LOZG: 1.0000 | LOZENGE | OROMUCOSAL | Status: DC | PRN

## 2018-07-22 MED ORDER — SENNA-DOCUSATE SODIUM 8.6-50 MG PO TABS: 2.0000 | ORAL_TABLET | Freq: Every day | ORAL | 1 refills | Status: DC

## 2018-07-22 MED ORDER — HYDROMORPHONE HCL 1 MG/ML IJ SOLN
INTRAMUSCULAR | Status: AC
  Filled 2018-07-22: qty 1

## 2018-07-22 MED ORDER — EPHEDRINE SULFATE 50 MG/ML IJ SOLN
INTRAMUSCULAR | Status: DC | PRN
  Administered 2018-07-22: 10 mg via INTRAVENOUS
  Administered 2018-07-22: 5 mg via INTRAVENOUS
  Administered 2018-07-22 (×2): 10 mg via INTRAVENOUS

## 2018-07-22 MED ORDER — ACETAMINOPHEN 10 MG/ML IV SOLN
INTRAVENOUS | Status: AC
  Administered 2018-07-22: 1000 mg via INTRAVENOUS
  Filled 2018-07-22: qty 100

## 2018-07-22 MED ORDER — ALUM & MAG HYDROXIDE-SIMETH 200-200-20 MG/5ML PO SUSP: 30.0000 mL | ORAL | Status: DC | PRN

## 2018-07-22 MED ORDER — HYDROMORPHONE HCL 1 MG/ML IJ SOLN
INTRAMUSCULAR | Status: AC
  Administered 2018-07-22: 0.5 mg via INTRAVENOUS
  Filled 2018-07-22: qty 1

## 2018-07-22 MED ORDER — MORPHINE SULFATE (PF) 2 MG/ML IV SOLN: 0.5000 mg | INTRAVENOUS | Status: DC | PRN

## 2018-07-22 MED ORDER — METOCLOPRAMIDE HCL 5 MG/ML IJ SOLN: 5.0000 mg | Freq: Three times a day (TID) | INTRAMUSCULAR | Status: DC | PRN

## 2018-07-22 MED ORDER — FENTANYL CITRATE (PF) 100 MCG/2ML IJ SOLN
INTRAMUSCULAR | Status: AC
  Filled 2018-07-22: qty 2

## 2018-07-22 MED ORDER — PROPOFOL 10 MG/ML IV BOLUS
INTRAVENOUS | Status: DC | PRN
  Administered 2018-07-22: 150 mg via INTRAVENOUS

## 2018-07-22 MED ORDER — CEFAZOLIN SODIUM 1 G IJ SOLR
INTRAMUSCULAR | Status: AC
  Filled 2018-07-22: qty 20

## 2018-07-22 MED ORDER — ACETAMINOPHEN 10 MG/ML IV SOLN
1000.0000 mg | Freq: Once | INTRAVENOUS | Status: DC | PRN
  Administered 2018-07-22: 1000 mg via INTRAVENOUS

## 2018-07-22 MED ORDER — CEFAZOLIN SODIUM-DEXTROSE 2-4 GM/100ML-% IV SOLN
2.0000 g | Freq: Four times a day (QID) | INTRAVENOUS | Status: AC
  Administered 2018-07-22 (×2): 2 g via INTRAVENOUS
  Filled 2018-07-22 (×2): qty 100

## 2018-07-22 MED ORDER — ONDANSETRON HCL 4 MG/2ML IJ SOLN: 4.0000 mg | Freq: Four times a day (QID) | INTRAMUSCULAR | Status: DC | PRN

## 2018-07-22 MED ORDER — ONDANSETRON HCL 4 MG PO TABS: 4.0000 mg | ORAL_TABLET | Freq: Four times a day (QID) | ORAL | Status: DC | PRN

## 2018-07-22 MED ORDER — BISACODYL 10 MG RE SUPP: 10.0000 mg | Freq: Every day | RECTAL | Status: DC | PRN

## 2018-07-22 MED ORDER — METOCLOPRAMIDE HCL 5 MG PO TABS: 5.0000 mg | ORAL_TABLET | Freq: Three times a day (TID) | ORAL | Status: DC | PRN

## 2018-07-22 MED ORDER — ONDANSETRON HCL 4 MG/2ML IJ SOLN
INTRAMUSCULAR | Status: DC | PRN
  Administered 2018-07-22: 4 mg via INTRAVENOUS

## 2018-07-22 MED ORDER — PHENYLEPHRINE 40 MCG/ML (10ML) SYRINGE FOR IV PUSH (FOR BLOOD PRESSURE SUPPORT)
PREFILLED_SYRINGE | INTRAVENOUS | Status: AC
  Filled 2018-07-22: qty 10

## 2018-07-22 MED ORDER — POTASSIUM CHLORIDE IN NACL 20-0.9 MEQ/L-% IV SOLN: INTRAVENOUS | Status: AC

## 2018-07-22 MED ORDER — POLYETHYLENE GLYCOL 3350 17 G PO PACK
17.0000 g | PACK | Freq: Every day | ORAL | Status: DC | PRN
  Administered 2018-07-24: 17 g via ORAL
  Filled 2018-07-22: qty 1

## 2018-07-22 MED ORDER — KETOROLAC TROMETHAMINE 15 MG/ML IJ SOLN: 7.5000 mg | Freq: Four times a day (QID) | INTRAMUSCULAR | Status: DC

## 2018-07-22 SURGICAL SUPPLY — 43 items
BENZOIN TINCTURE PRP APPL 2/3 (GAUZE/BANDAGES/DRESSINGS) IMPLANT
BNDG COHESIVE 6X5 TAN STRL LF (GAUZE/BANDAGES/DRESSINGS) ×3 IMPLANT
BOOTCOVER CLEANROOM LRG (PROTECTIVE WEAR) ×6 IMPLANT
CLOSURE STERI-STRIP 1/2X4 (GAUZE/BANDAGES/DRESSINGS) ×1
CLSR STERI-STRIP ANTIMIC 1/2X4 (GAUZE/BANDAGES/DRESSINGS) ×2 IMPLANT
COVER PERINEAL POST (MISCELLANEOUS) ×3 IMPLANT
COVER SURGICAL LIGHT HANDLE (MISCELLANEOUS) ×3 IMPLANT
COVER WAND RF STERILE (DRAPES) ×3 IMPLANT
DRAPE STERI IOBAN 125X83 (DRAPES) ×3 IMPLANT
DRSG MEPILEX BORDER 4X4 (GAUZE/BANDAGES/DRESSINGS) ×3 IMPLANT
DRSG PAD ABDOMINAL 8X10 ST (GAUZE/BANDAGES/DRESSINGS) IMPLANT
DURAPREP 26ML APPLICATOR (WOUND CARE) ×3 IMPLANT
ELECT REM PT RETURN 9FT ADLT (ELECTROSURGICAL) ×3
ELECTRODE REM PT RTRN 9FT ADLT (ELECTROSURGICAL) ×1 IMPLANT
GLOVE BIOGEL PI ORTHO PRO SZ8 (GLOVE) ×4
GLOVE ORTHO TXT STRL SZ7.5 (GLOVE) ×3 IMPLANT
GLOVE PI ORTHO PRO STRL SZ8 (GLOVE) ×2 IMPLANT
GLOVE SURG ORTHO 8.0 STRL STRW (GLOVE) ×6 IMPLANT
GOWN STRL REUS W/ TWL XL LVL3 (GOWN DISPOSABLE) ×1 IMPLANT
GOWN STRL REUS W/TWL 2XL LVL3 (GOWN DISPOSABLE) IMPLANT
GOWN STRL REUS W/TWL XL LVL3 (GOWN DISPOSABLE) ×2
KIT BASIN OR (CUSTOM PROCEDURE TRAY) ×3 IMPLANT
KIT TURNOVER KIT B (KITS) ×3 IMPLANT
LINER BOOT UNIVERSAL DISP (MISCELLANEOUS) IMPLANT
MANIFOLD NEPTUNE II (INSTRUMENTS) ×3 IMPLANT
NEEDLE 22X1 1/2 (OR ONLY) (NEEDLE) ×3 IMPLANT
NS IRRIG 1000ML POUR BTL (IV SOLUTION) ×3 IMPLANT
PACK GENERAL/GYN (CUSTOM PROCEDURE TRAY) ×3 IMPLANT
PAD ARMBOARD 7.5X6 YLW CONV (MISCELLANEOUS) ×6 IMPLANT
PIN GUIDE DRILL TIP 2.8X300 (DRILL) ×9 IMPLANT
SCREW CANN 8.0X100 HIP (Screw) ×3 IMPLANT
SCREW CANN 8.0X85 HIP (Screw) ×6 IMPLANT
SUT VIC AB 2-0 FS1 27 (SUTURE) ×3 IMPLANT
SUT VIC AB 2-0 SH 27 (SUTURE)
SUT VIC AB 2-0 SH 27XBRD (SUTURE) IMPLANT
SUT VIC AB 3-0 SH 27 (SUTURE)
SUT VIC AB 3-0 SH 27X BRD (SUTURE) IMPLANT
SYR CONTROL 10ML LL (SYRINGE) ×3 IMPLANT
TOWEL OR 17X24 6PK STRL BLUE (TOWEL DISPOSABLE) ×3 IMPLANT
TOWEL OR 17X26 10 PK STRL BLUE (TOWEL DISPOSABLE) ×3 IMPLANT
WASHER 8.0 (Orthopedic Implant) ×4 IMPLANT
WASHER CANN FLAT 8 (Orthopedic Implant) ×2 IMPLANT
WATER STERILE IRR 1000ML POUR (IV SOLUTION) IMPLANT

## 2018-07-22 NOTE — Anesthesia Preprocedure Evaluation (Addendum)
Anesthesia Evaluation  Patient identified by MRN, date of birth, ID band Patient awake    Reviewed: Allergy & Precautions, NPO status , Patient's Chart, lab work & pertinent test results  Airway Mallampati: II  TM Distance: >3 FB Neck ROM: Full    Dental  (+) Edentulous Upper, Edentulous Lower   Pulmonary former smoker,    Pulmonary exam normal breath sounds clear to auscultation       Cardiovascular hypertension, Pt. on home beta blockers and Pt. on medications + CAD, + Past MI, + Cardiac Stents, + CABG and +CHF  Normal cardiovascular exam+ Cardiac Defibrillator  Rhythm:Regular Rate:Normal  ECG: rate 72. Sinus rhythm Multiform ventricular premature complexes Right bundle branch block  ECHO: Left ventricle: Mildly dilated LV with diffuse hypokinesis and inferior/inferolateral akinesis. EF 25-30%. Aortic valve: There was no stenosis. There was trivial regurgitation. Aorta: Normal caliber aorta with mild plaque. Mitral valve: There was restriction of the posterior mitral leaflet with severe MR, ERO 0.55 cm^2. There was flattening of the pulmonary vein systolic doppler signal. Left atrium: The atrium was moderately dilated. Right ventricle: The cavity size was mildly dilated. Pacer wire or catheter noted in right ventricle. Systolic function was mildly to moderately reduced. Right atrium: The atrium was mildly dilated. Atrial septum: No defect or patent foramen ovale was identified. Echo contrast study showed no right-to-left atrial level shunt, at baseline or with provocation. Tricuspid valve: There was moderate regurgitation. Peak RV-RA gradient (S): 23 mm Hg. Pulmonic valve: No evidence of vegetation.  Sees cardiologist [employer])   Neuro/Psych PSYCHIATRIC DISORDERS Anxiety Depression negative neurological ROS     GI/Hepatic Neg liver ROS, hiatal hernia, PUD, GERD  Medicated and Controlled,  Endo/Other  negative  endocrine ROS  Renal/GU Renal InsufficiencyRenal disease     Musculoskeletal negative musculoskeletal ROS (+)   Abdominal   Peds  Hematology HLD   Anesthesia Other Findings hip fracture  Reproductive/Obstetrics                           Anesthesia Physical Anesthesia Plan  ASA: III  Anesthesia Plan: General   Post-op Pain Management:    Induction: Intravenous  PONV Risk Score and Plan: 2 and Ondansetron, Dexamethasone and Treatment may vary due to age or medical condition  Airway Management Planned: LMA  Additional Equipment:   Intra-op Plan:   Post-operative Plan: Extubation in OR  Informed Consent: I have reviewed the patients History and Physical, chart, labs and discussed the procedure including the risks, benefits and alternatives for the proposed anesthesia with the patient or authorized representative who has indicated his/her understanding and acceptance.   Dental advisory given  Plan Discussed with: CRNA  Anesthesia Plan Comments: Lyla Son)       Anesthesia Quick Evaluation

## 2018-07-22 NOTE — Plan of Care (Signed)

## 2018-07-22 NOTE — Progress Notes (Signed)
The patient has been re-examined, and the chart reviewed, and there have been no interval changes to the documented history and physical.    The risks, benefits, and alternatives have been discussed at length, and the patient is willing to proceed.    Also spoke with family, Legrand Como, brother.   Johnny Bridge, MD

## 2018-07-22 NOTE — Transfer of Care (Signed)
Immediate Anesthesia Transfer of Care Note  Patient: Carl [name]  Procedure(s) Performed: CANNULATED HIP PINNING (Left Hip)  Patient Location: PACU  Anesthesia Type:General  Level of Consciousness: awake and alert   Airway & Oxygen Therapy: Patient Spontanous Breathing and Patient connected to face mask oxygen  Post-op Assessment: Report given to RN and Post -op Vital signs reviewed and stable  Post vital signs: Reviewed and stable  Last Vitals:  Vitals Value Taken Time  BP 112/74 07/22/2018  9:33 AM  Temp    Pulse 65 07/22/2018  9:36 AM  Resp 17 07/22/2018  9:36 AM  SpO2 95 % 07/22/2018  9:36 AM  Vitals shown include unvalidated device data.  Last Pain:  Vitals:   07/22/18 0339  TempSrc: Oral  PainSc:          Complications: No apparent anesthesia complications

## 2018-07-22 NOTE — Anesthesia Postprocedure Evaluation (Signed)
Anesthesia Post Note  Patient: Carl [name]  Procedure(s) Performed: CANNULATED HIP PINNING (Left Hip)     Patient location during evaluation: PACU Anesthesia Type: General Level of consciousness: awake Pain management: pain level controlled Vital Signs Assessment: post-procedure vital signs reviewed and stable Respiratory status: spontaneous breathing, nonlabored ventilation, respiratory function stable and patient connected to nasal cannula oxygen Cardiovascular status: blood pressure returned to baseline and stable Postop Assessment: no apparent nausea or vomiting Anesthetic complications: no    Last Vitals:  Vitals:   07/22/18 1123 07/22/18 1601  BP: 100/74 (!) 97/59  Pulse: 65 67  Resp: 16 17  Temp: 36.6 C 36.7 C  SpO2: 98% 98%    Last Pain:  Vitals:   07/22/18 1752  TempSrc:   PainSc: 0-No pain                 Jonda Alanis P Tanazia Achee

## 2018-07-22 NOTE — Progress Notes (Signed)
PROGRESS NOTE    Carl [name]  YIR:[id number] DOB: Sep 10, 1950 DOA: 07/21/2018 PCP: [name], MD  Brief Narrative: Carl [name] is a 67 y.o. male with medical history significant of hypertension, hyperlipidemia, GERD, depression, anemia, CHF with EF 25%, AICD, CAD, CABG, psoriasis, peptic ulcer disease, vitiligo, CKD 3, who presented with left hip pain following a mechanical fall several days back.  Went orthopedic MD office was found to have a left hip fracture, sent to the emergency room. -Went to OR this morning, underwent left hip pinning  Assessment & Plan:   Principal Problem:   Closed left hip fracture ([place]) -Status post left hip pinning this morning -Lovenox for DVT prophylaxis -PT evaluation, orthopedics following -Suspect will likely need short-term rehab  Chronic systolic CHF with EF of 50% -Clinically appears euvolemic, blood pressure is soft postop -Gentle IV fluids for now, hold Lasix and metolazone today -Resume diuretics in the next 24 to 48 hours  History of CAD, CABG, AICD -Last Myoview in 2018 without inducible ischemia -Stable  Chronic kidney disease stage III -Creatinine stable, hold diuretic and ARB at this time due to soft blood pressures  Hypertension -Continue Coreg, hold Cozaar  Iron deficiency anemia -Hemoglobin stable, monitor preop  Depression -Continue Celexa  DVT prophylaxis: Lovenox Code Status: Full code Family Communication: No family at bedside Disposition Plan: Anticipate will need rehab  Consultants:   Orthopedics Dr. Mardelle Matte   Procedures: Left hip pinning 11/28  Antimicrobials:    Subjective: -Feels okay, just just back from OR, mild left hip soreness  Objective: Vitals:   07/22/18 1047 07/22/18 1100 07/22/18 1103 07/22/18 1123  BP: 96/69  101/69 100/74  Pulse: 65 66 65 65  Resp: 14 13 11 16   Temp:   97.7 F (36.5 C) 97.9 F (36.6 C)  TempSrc:    Oral  SpO2: 98% 94% 100% 98%  Weight:      Height:          Intake/Output Summary (Last 24 hours) at 07/22/2018 1200 Last data filed at 07/22/2018 1100 Gross per 24 hour  Intake 200 ml  Output 1920 ml  Net -1720 ml   Filed Weights   07/21/18 1808  Weight: 79.4 kg    Examination:  General exam: Appears calm and comfortable, no distress Respiratory system: Clear bilaterally Cardiovascular system: S1 & S2 heard, RRR Gastrointestinal system: Abdomen is nondistended, soft and nontender.Normal bowel sounds heard. Central nervous system: Alert and oriented. No focal neurological deficits. Extremities: No edema, left hip with dressing Skin: No rashes, lesions or ulcers Psychiatry: Mood & affect appropriate.     Data Reviewed:   CBC: Recent Labs  Lab 07/21/18 1831 07/21/18 1917  WBC 5.6  --   NEUTROABS 4.2  --   HGB 12.9* 13.6  HCT 39.3 40.0  MCV 99.7  --   PLT 157  --    Basic Metabolic Panel: Recent Labs  Lab 07/21/18 1831 07/21/18 1917 07/22/18 0123  NA 137 136  --   K 3.2* 3.2*  --   CL 101 99  --   CO2 25  --   --   GLUCOSE 156* 155*  --   BUN 42* 44*  --   CREATININE 1.53* 1.60*  --   CALCIUM 9.2  --   --   MG  --   --  1.8   GFR: Estimated Creatinine Clearance: 49.8 mL/min (A) (by C-G formula based on SCr of 1.6 mg/dL (H)). Liver Function Tests: No  results for input(s): AST, ALT, ALKPHOS, BILITOT, PROT, ALBUMIN in the last 168 hours. No results for input(s): LIPASE, AMYLASE in the last 168 hours. No results for input(s): AMMONIA in the last 168 hours. Coagulation Profile: Recent Labs  Lab 07/21/18 1831  INR 1.21   Cardiac Enzymes: No results for input(s): CKTOTAL, CKMB, CKMBINDEX, TROPONINI in the last 168 hours. BNP (last 3 results) No results for input(s): PROBNP in the last 8760 hours. HbA1C: No results for input(s): HGBA1C in the last 72 hours. CBG: No results for input(s): GLUCAP in the last 168 hours. Lipid Profile: No results for input(s): CHOL, HDL, LDLCALC, TRIG, CHOLHDL, LDLDIRECT in  the last 72 hours. Thyroid Function Tests: No results for input(s): TSH, T4TOTAL, FREET4, T3FREE, THYROIDAB in the last 72 hours. Anemia Panel: No results for input(s): VITAMINB12, FOLATE, FERRITIN, TIBC, IRON, RETICCTPCT in the last 72 hours. Urine analysis:    Component Value Date/Time   COLORURINE STRAW (A) 06/11/2017 0555   APPEARANCEUR CLEAR (A) 06/11/2017 0555   APPEARANCEUR Clear 04/28/2017 1259   LABSPEC 1.014 06/11/2017 0555   PHURINE 5.0 06/11/2017 0555   GLUCOSEU NEGATIVE 06/11/2017 0555   HGBUR NEGATIVE 06/11/2017 0555   BILIRUBINUR NEGATIVE 06/11/2017 0555   BILIRUBINUR Negative 04/28/2017 1259   KETONESUR NEGATIVE 06/11/2017 0555   PROTEINUR NEGATIVE 06/11/2017 0555   NITRITE NEGATIVE 06/11/2017 0555   LEUKOCYTESUR NEGATIVE 06/11/2017 0555   LEUKOCYTESUR Negative 04/28/2017 1259   Sepsis Labs: @LABRCNTIP (procalcitonin:4,lacticidven:4)  ) Recent Results (from the past 240 hour(s))  Surgical pcr screen     Status: None   Collection Time: 07/21/18 10:56 PM  Result Value Ref Range Status   MRSA, PCR NEGATIVE NEGATIVE Final   Staphylococcus aureus NEGATIVE NEGATIVE Final    Comment: (NOTE) The Xpert SA Assay (FDA approved for NASAL specimens in patients 77 years of age and older), is one component of a comprehensive surveillance program. It is not intended to diagnose infection nor to guide or monitor treatment. Performed at [facility], [place] [address] [place], [place] [address]          Radiology Studies: Pelvis Portable  Result Date: 07/22/2018 CLINICAL DATA:  Status post left hip pinning EXAM: PORTABLE PELVIS 1-2 VIEWS COMPARISON:  Intraoperative films from earlier in the same day. FINDINGS: There are changes consistent with a fracture through the femoral neck with 3 fixation pins identified. No new focal abnormality is noted. Some deformity in the mid femoral shaft is seen consistent with prior healed fracture. IMPRESSION: Status post left hip  pinning Electronically Signed   By: Inez Catalina M.D.   On: 07/22/2018 10:15   Dg C-arm 1-60 Min  Result Date: 07/22/2018 CLINICAL DATA:  Left hip pinning EXAM: OPERATIVE LEFT HIP WITH PELVIS; DG C-ARM 61-120 MIN COMPARISON:  None. FLUOROSCOPY TIME:  Radiation Exposure Index (as provided by the fluoroscopic device): Unavailable If the device does not provide the exposure index: Fluoroscopy Time:  2 minutes 5 seconds Number of Acquired Images:  2 FINDINGS: Three fixation screws are noted traversing the femoral neck into the femoral head. Increased sclerosis along the junction of the femoral neck and femoral head is noted. IMPRESSION: ORIF of proximal left femur. Electronically Signed   By: Inez Catalina M.D.   On: 07/22/2018 10:04   Dg Hip Operative Unilat W Or W/o Pelvis Left  Result Date: 07/22/2018 CLINICAL DATA:  Left hip pinning EXAM: OPERATIVE LEFT HIP WITH PELVIS; DG C-ARM 61-120 MIN COMPARISON:  None. FLUOROSCOPY TIME:  Radiation  Exposure Index (as provided by the fluoroscopic device): Unavailable If the device does not provide the exposure index: Fluoroscopy Time:  2 minutes 5 seconds Number of Acquired Images:  2 FINDINGS: Three fixation screws are noted traversing the femoral neck into the femoral head. Increased sclerosis along the junction of the femoral neck and femoral head is noted. IMPRESSION: ORIF of proximal left femur. Electronically Signed   By: Inez Catalina M.D.   On: 07/22/2018 10:04        Scheduled Meds: . acetaminophen  500 mg Oral Q6H  . atorvastatin  40 mg Oral q1800  . carvedilol  3.125 mg Oral BID WC  . citalopram  20 mg Oral Daily  . docusate sodium  100 mg Oral BID  . [START ON 07/23/2018] enoxaparin (LOVENOX) injection  30 mg Subcutaneous Q24H  . fentaNYL      . ferrous sulfate  325 mg Oral Q breakfast  . HYDROmorphone      . pantoprazole  40 mg Oral Daily  . vitamin B-12  500 mcg Oral Daily   Continuous Infusions: . 0.9 % NaCl with KCl 20 mEq / L 75  mL/hr at 07/22/18 1151  .  ceFAZolin (ANCEF) IV       LOS: 1 day    Time spent: 3min    Domenic Polite, MD [facility] Page via [email or web address], password [email or web address] After 7PM please contact night-coverage  07/22/2018, 12:00 PM

## 2018-07-22 NOTE — Op Note (Signed)
07/21/2018 - 07/22/2018  9:16 AM  PATIENT:  Carl [name]    PRE-OPERATIVE DIAGNOSIS: Left femoral neck tension sided acute on chronic femoral neck stress fracture  POST-OPERATIVE DIAGNOSIS:  Same  PROCEDURE: Left CANNULATED HIP PINNING  SURGEON:  Johnny Bridge, MD  PHYSICIAN ASSISTANT: Joya Gaskins, OPA-C, present and scrubbed throughout the case, critical for completion in a timely fashion, and for retraction, instrumentation, and closure.  ANESTHESIA:   General  ESTIMATED BLOOD LOSS: 50 mL.  PREOPERATIVE INDICATIONS:  Carl [name] is a  67 y.o. male who has had somewhat chronic left hip pain, with a history of a proximal femoral shaft fracture treated nonsurgically from over 40 years ago, who presented to the office yesterday with left hip pain after a fall that occurred approximately 10 days ago.  X-rays demonstrated sclerosis at the compression side of the femoral neck fracture, with distraction at the tension side.  He elected for surgical management.  We discussed the options of arthroplasty versus pinning, and the arthroplasty is likely to be high risk, complicated, and extremely difficult given his proximal femoral deformity.  We are going to hopefully be able to achieve union with a percutaneous pinning.  The risks benefits and alternatives were discussed with the patient preoperatively including but not limited to the risks of infection, bleeding, nerve injury, cardiopulmonary complications, blood clots, malunion, nonunion, avascular necrosis, the need for revision surgery, the potential for conversion to hemiarthroplasty, among others, and the patient was willing to proceed.  OPERATIVE IMPLANTS: 8.0 mm partially-threaded titanium cannulated screws x3 with a washer inferiorly and a washer posteriorly with Biomet star headed screws.  OPERATIVE FINDINGS: Sclerosis at the fracture site, overall somewhat weakened bone.  OPERATIVE PROCEDURE: The patient was brought to the  operating room and placed in supine position. IV antibiotics were given. General anesthesia administered. The patient was placed on the fracture table. The operative extremity was positioned, with a rotational reduction maneuver and was prepped and draped in usual sterile fashion.  Time out was performed.  The fracture was somewhat unstable, but seemed to hinged medially.  Small incision was made distal to the greater trochanter, I began with the anterior superior guidewire, I placed it as high up into the neck and head as possible in order to achieve compression at the tension side.  There was significant sclerosis encountered when the pin arrived at the fracture site.  I placed the screw before placing any additional hardware, in order to try and achieve compression.  The bone quality was somewhat poor, the head almost wanted to countersink.    I then placed the posterior guidewire, used a washer, and again attempted to achieve some more superior compression as much as it would give me.  I then placed an inferior screw, taking care to remain above the lesser trochanter, and had excellent fixation using a washer.  During the placement of the inferior screw the guidepin did advance somewhat just past the subchondral margin, and this was backed out.    I had overall satisfactory fixation, hopefully this will heal, I will plan for nonweightbearing left lower extremity for 6 to 8 weeks.   The wounds were irrigated copiously, and repaired with Vicryl with Steri-Strips and sterile gauze. There no complications and the patient tolerated the procedure well.  The patient will be on DVT prophylaxis for 1 month post-operatively, will discuss with pharmacy best type given his renal dysfunction.

## 2018-07-22 NOTE — Discharge Instructions (Signed)
Diet: As you were doing prior to hospitalization   Shower:  May shower but keep the wounds dry, use an occlusive plastic wrap, NO SOAKING IN TUB.  If the bandage gets wet, change with a clean dry gauze.  If you have a splint on, leave the splint in place and keep the splint dry with a plastic bag.  Dressing:  You may change your dressing 3-5 days after surgery, unless you have a splint.  If you have a splint, then just leave the splint in place and we will change your bandages during your first follow-up appointment.    If you had hand or foot surgery, we will plan to remove your stitches in about 2 weeks in the office.  For all other surgeries, there are sticky tapes (steri-strips) on your wounds and all the stitches are absorbable.  Leave the steri-strips in place when changing your dressings, they will peel off with time, usually 2-3 weeks.  Activity:  Increase activity slowly as tolerated, but follow the weight bearing instructions below.  The rules on driving is that you can not be taking narcotics while you drive, and you must feel in control of the vehicle.    Weight Bearing:   Nonweightbearing, left lower extremity.    To prevent constipation: you may use a stool softener such as -  Colace (over the counter) 100 mg by mouth twice a day  Drink plenty of fluids (prune juice may be helpful) and high fiber foods Miralax (over the counter) for constipation as needed.    Itching:  If you experience itching with your medications, try taking only a single pain pill, or even half a pain pill at a time.  You may take up to 10 pain pills per day, and you can also use benadryl over the counter for itching or also to help with sleep.   Precautions:  If you experience chest pain or shortness of breath - call 911 immediately for transfer to the hospital emergency department!!  If you develop a fever greater that 101 F, purulent drainage from wound, increased redness or drainage from wound, or calf  pain -- Call the office at [phone]                                                Follow- Up Appointment:  Please call for an appointment to be seen in 2 weeks [place] - [phone]

## 2018-07-22 NOTE — Anesthesia Procedure Notes (Signed)
Procedure Name: LMA Insertion Date/Time: 07/22/2018 8:18 AM Performed by: Bryson Corona, CRNA Pre-anesthesia Checklist: Patient identified, Emergency Drugs available, Suction available and Patient being monitored Patient Re-evaluated:Patient Re-evaluated prior to induction Oxygen Delivery Method: Circle System Utilized Preoxygenation: Pre-oxygenation with 100% oxygen Induction Type: IV induction Ventilation: Mask ventilation without difficulty LMA: LMA with gastric port inserted LMA Size: 5.0 Number of attempts: 1 Airway Equipment and Method: Bite block Placement Confirmation: positive ETCO2 Tube secured with: Tape Dental Injury: Teeth and Oropharynx as per pre-operative assessment

## 2018-07-23 ENCOUNTER — Encounter (HOSPITAL_COMMUNITY): Payer: Self-pay | Admitting: Orthopedic Surgery

## 2018-07-23 LAB — BASIC METABOLIC PANEL
Anion gap: 10 (ref 5–15)
BUN: 37 mg/dL — ABNORMAL HIGH (ref 8–23)
CHLORIDE: 99 mmol/L (ref 98–111)
CO2: 25 mmol/L (ref 22–32)
Calcium: 8.9 mg/dL (ref 8.9–10.3)
Creatinine, Ser: 1.6 mg/dL — ABNORMAL HIGH (ref 0.61–1.24)
GFR calc Af Amer: 51 mL/min — ABNORMAL LOW (ref >60)
GFR calc non Af Amer: 44 mL/min — ABNORMAL LOW (ref >60)
Glucose, Bld: 126 mg/dL — ABNORMAL HIGH (ref 70–99)
POTASSIUM: 3.4 mmol/L — AB (ref 3.5–5.1)
Sodium: 134 mmol/L — ABNORMAL LOW (ref 135–145)

## 2018-07-23 LAB — CBC
HCT: 35.1 % — ABNORMAL LOW (ref 39.0–52.0)
Hemoglobin: 11.4 g/dL — ABNORMAL LOW (ref 13.0–17.0)
MCH: 32 pg (ref 26.0–34.0)
MCHC: 32.5 g/dL (ref 30.0–36.0)
MCV: 98.6 fL (ref 80.0–100.0)
NRBC: 0 % (ref 0.0–0.2)
Platelets: 160 10*3/uL (ref 150–400)
RBC: 3.56 MIL/uL — AB (ref 4.22–5.81)
RDW: 13.7 % (ref 11.5–15.5)
WBC: 11.2 10*3/uL — ABNORMAL HIGH (ref 4.0–10.5)

## 2018-07-23 LAB — HIV ANTIBODY (ROUTINE TESTING W REFLEX): HIV Screen 4th Generation wRfx: NONREACTIVE

## 2018-07-23 MED ORDER — POTASSIUM CHLORIDE CRYS ER 20 MEQ PO TBCR
40.0000 meq | EXTENDED_RELEASE_TABLET | Freq: Once | ORAL | Status: AC
  Administered 2018-07-23: 40 meq via ORAL
  Filled 2018-07-23: qty 2

## 2018-07-23 NOTE — Progress Notes (Addendum)
Patient ID: Carl [name], male   DOB: 09-11-1950, 67 y.o.   MRN: [id number]     Subjective:  Patient reports pain as mild to moderate.  Patient having pain in the knee but overall much better   Objective:   VITALS:   Vitals:   07/22/18 2008 07/23/18 0115 07/23/18 0503 07/23/18 0539  BP: 116/74 107/74 93/71 96/70   Pulse: 69 76 67 68  Resp: 16 16 16    Temp: 98.6 F (37 C) 98.5 F (36.9 C) 98.2 F (36.8 C)   TempSrc: Oral Oral Oral   SpO2: 99% 94% 98%   Weight:      Height:        ABD soft Sensation intact distally Dorsiflexion/Plantar flexion intact Incision: dressing C/D/I and scant drainage   Lab Results  Component Value Date   WBC 11.2 (H) 07/23/2018   HGB 11.4 (L) 07/23/2018   HCT 35.1 (L) 07/23/2018   MCV 98.6 07/23/2018   PLT 160 07/23/2018   BMET    Component Value Date/Time   NA 134 (L) 07/23/2018 0107   NA 142 09/30/2016 1215   NA 136 07/27/2014 0203   K 3.4 (L) 07/23/2018 0107   K 4.8 07/27/2014 0203   CL 99 07/23/2018 0107   CL 97 (L) 07/27/2014 0203   CO2 25 07/23/2018 0107   CO2 28 07/27/2014 0203   GLUCOSE 126 (H) 07/23/2018 0107   GLUCOSE 131 (H) 07/27/2014 0203   BUN 37 (H) 07/23/2018 0107   BUN 38 (H) 09/30/2016 1215   BUN 25 (H) 07/27/2014 0203   CREATININE 1.60 (H) 07/23/2018 0107   CREATININE 1.60 (H) 07/27/2014 0203   CALCIUM 8.9 07/23/2018 0107   CALCIUM 8.8 07/27/2014 0203   GFRNONAA 44 (L) 07/23/2018 0107   GFRNONAA 47 (L) 07/27/2014 0203   GFRAA 51 (L) 07/23/2018 0107   GFRAA 57 (L) 07/27/2014 0203     Assessment/Plan: 1 Day Post-Op   Principal Problem:   Closed left hip fracture (HCC) Active Problems:   S/P CABG x 5   Hyperlipidemia   Essential hypertension   CAD (coronary artery disease)   Chronic systolic CHF (congestive heart failure) (HCC)   GERD (gastroesophageal reflux disease)   Depression   CKD (chronic kidney disease), stage III (HCC)   Iron deficiency anemia   Hypokalemia   Advance diet Up with  therapy Continue plan per medicine NWB left lower ext Dry dressing PRN   Lunette Stands 07/23/2018, 8:30 AM  Discussed and agree with above.  NWB, reduced dose lovenox.  Dc meds prepared.    [name], MD Cell [phone]

## 2018-07-23 NOTE — Progress Notes (Signed)
Paged MD re: multiple runs of Vtach @1750 -10 beats and @1830 -5 beats. Also hypotensive. Refer to flowsheet. Previously held carvedilol pending MD advice.  Pt is asymptomatic otherwise. Awaiting response.

## 2018-07-23 NOTE — Social Work (Signed)
CSW acknowledging consult for SNF placement. Pt PASRR pending, acknowledging preference for [facility].  Westley Hummer, MSW, [place] Work [phone]

## 2018-07-23 NOTE — Plan of Care (Signed)
  Problem: Pain Managment: Goal: General experience of comfort will improve Outcome: Progressing   Problem: Safety: Goal: Ability to remain free from injury will improve Outcome: Progressing   

## 2018-07-23 NOTE — Plan of Care (Signed)
  Problem: Pain Managment: Goal: General experience of comfort will improve Outcome: Progressing   Problem: Safety: Goal: Ability to remain free from injury will improve Outcome: Progressing   Problem: Skin Integrity: Goal: Risk for impaired skin integrity will decrease Outcome: Progressing   

## 2018-07-23 NOTE — Progress Notes (Signed)
PROGRESS NOTE    Carl [name]  WUX:[id number] DOB: June 10, 1951 DOA: 07/21/2018 PCP: [name], MD  Brief Narrative: Carl [name] is a 67 y.o. male with medical history significant of hypertension, hyperlipidemia, GERD, depression, anemia, CHF with EF 25%, AICD, CAD, CABG, psoriasis, peptic ulcer disease, vitiligo, CKD 3, who presented with left hip pain following a mechanical fall several days back.  Went orthopedic MD office was found to have a left hip fracture, sent to the emergency room. -Went to OR 11/28 morning, underwent left hip pinning -Hopeful for CIR  Assessment & Plan:     Closed left hip fracture ([place]) -Status post left hip pinning 11/28 -Lovenox for DVT prophylaxis -PT evaluation, orthopedics following -Pt hopeful for CIR, medically stable for DC  Chronic systolic CHF with EF of 25% -Clinically appears euvolemic -BP is soft, hold diuretics today  History of CAD, CABG, AICD -Last Myoview in 2018 without inducible ischemia -Stable  Chronic kidney disease stage III -Creatinine stable, hold diuretic and ARB at this time due to soft blood pressures  Hypertension -Continue Coreg, hold Cozaar  Iron deficiency anemia -Hemoglobin dropped a little as expected, monitor   Depression -Continue Celexa  DVT prophylaxis: Lovenox Code Status: Full code Family Communication: family at bedside Disposition Plan: CIR  Consultants:   Orthopedics Dr. Mardelle Matte   Procedures: Left hip pinning 11/28  Antimicrobials:    Subjective: -Awaiting information about rehab  Objective: Vitals:   07/23/18 0503 07/23/18 0539 07/23/18 0859 07/23/18 1016  BP: 93/71 96/70 105/76 100/67  Pulse: 67 68 77 72  Resp: 16   18  Temp: 98.2 F (36.8 C)   97.9 F (36.6 C)  TempSrc: Oral   Oral  SpO2: 98%   94%  Weight:      Height:        Intake/Output Summary (Last 24 hours) at 07/23/2018 1327 Last data filed at 07/23/2018 1257 Gross per 24 hour  Intake 1440.65 ml    Output 1620 ml  Net -179.35 ml   Filed Weights   07/21/18 1808  Weight: 79.4 kg    Examination: Gen: Awake, Alert, Oriented X 2, mild cognitive deficits noted HEENT: PERRLA, Neck supple, no JVD Lungs: Clear bilaterally CVS: S1-S2/RRR Abd: soft, Non tender, non distended, BS present Extremities: Left hip with dressing c/d/i Skin: no new rashes Psychiatry: Mood & affect appropriate.     Data Reviewed:   CBC: Recent Labs  Lab 07/21/18 1831 07/21/18 1917 07/23/18 0107  WBC 5.6  --  11.2*  NEUTROABS 4.2  --   --   HGB 12.9* 13.6 11.4*  HCT 39.3 40.0 35.1*  MCV 99.7  --  98.6  PLT 157  --  366   Basic Metabolic Panel: Recent Labs  Lab 07/21/18 1831 07/21/18 1917 07/22/18 0123 07/23/18 0107  NA 137 136  --  134*  K 3.2* 3.2*  --  3.4*  CL 101 99  --  99  CO2 25  --   --  25  GLUCOSE 156* 155*  --  126*  BUN 42* 44*  --  37*  CREATININE 1.53* 1.60*  --  1.60*  CALCIUM 9.2  --   --  8.9  MG  --   --  1.8  --    GFR: Estimated Creatinine Clearance: 49.2 mL/min (A) (by C-G formula based on SCr of 1.6 mg/dL (H)). Liver Function Tests: No results for input(s): AST, ALT, ALKPHOS, BILITOT, PROT, ALBUMIN in the last 168 hours. No  results for input(s): LIPASE, AMYLASE in the last 168 hours. No results for input(s): AMMONIA in the last 168 hours. Coagulation Profile: Recent Labs  Lab 07/21/18 1831  INR 1.21   Cardiac Enzymes: No results for input(s): CKTOTAL, CKMB, CKMBINDEX, TROPONINI in the last 168 hours. BNP (last 3 results) No results for input(s): PROBNP in the last 8760 hours. HbA1C: No results for input(s): HGBA1C in the last 72 hours. CBG: No results for input(s): GLUCAP in the last 168 hours. Lipid Profile: No results for input(s): CHOL, HDL, LDLCALC, TRIG, CHOLHDL, LDLDIRECT in the last 72 hours. Thyroid Function Tests: No results for input(s): TSH, T4TOTAL, FREET4, T3FREE, THYROIDAB in the last 72 hours. Anemia Panel: No results for input(s):  VITAMINB12, FOLATE, FERRITIN, TIBC, IRON, RETICCTPCT in the last 72 hours. Urine analysis:    Component Value Date/Time   COLORURINE STRAW (A) 06/11/2017 0555   APPEARANCEUR CLEAR (A) 06/11/2017 0555   APPEARANCEUR Clear 04/28/2017 1259   LABSPEC 1.014 06/11/2017 0555   PHURINE 5.0 06/11/2017 0555   GLUCOSEU NEGATIVE 06/11/2017 0555   HGBUR NEGATIVE 06/11/2017 0555   BILIRUBINUR NEGATIVE 06/11/2017 0555   BILIRUBINUR Negative 04/28/2017 1259   KETONESUR NEGATIVE 06/11/2017 0555   PROTEINUR NEGATIVE 06/11/2017 0555   NITRITE NEGATIVE 06/11/2017 0555   LEUKOCYTESUR NEGATIVE 06/11/2017 0555   LEUKOCYTESUR Negative 04/28/2017 1259   Sepsis Labs: @LABRCNTIP (procalcitonin:4,lacticidven:4)  ) Recent Results (from the past 240 hour(s))  Surgical pcr screen     Status: None   Collection Time: 07/21/18 10:56 PM  Result Value Ref Range Status   MRSA, PCR NEGATIVE NEGATIVE Final   Staphylococcus aureus NEGATIVE NEGATIVE Final    Comment: (NOTE) The Xpert SA Assay (FDA approved for NASAL specimens in patients 67 years of age and older), is one component of a comprehensive surveillance program. It is not intended to diagnose infection nor to guide or monitor treatment. Performed at [facility], [place] [address] [place], [place] [address]          Radiology Studies: Pelvis Portable  Result Date: 07/22/2018 CLINICAL DATA:  Status post left hip pinning EXAM: PORTABLE PELVIS 1-2 VIEWS COMPARISON:  Intraoperative films from earlier in the same day. FINDINGS: There are changes consistent with a fracture through the femoral neck with 3 fixation pins identified. No new focal abnormality is noted. Some deformity in the mid femoral shaft is seen consistent with prior healed fracture. IMPRESSION: Status post left hip pinning Electronically Signed   By: Inez Catalina M.D.   On: 07/22/2018 10:15   Dg C-arm 1-60 Min  Result Date: 07/22/2018 CLINICAL DATA:  Left hip pinning EXAM:  OPERATIVE LEFT HIP WITH PELVIS; DG C-ARM 61-120 MIN COMPARISON:  None. FLUOROSCOPY TIME:  Radiation Exposure Index (as provided by the fluoroscopic device): Unavailable If the device does not provide the exposure index: Fluoroscopy Time:  2 minutes 5 seconds Number of Acquired Images:  2 FINDINGS: Three fixation screws are noted traversing the femoral neck into the femoral head. Increased sclerosis along the junction of the femoral neck and femoral head is noted. IMPRESSION: ORIF of proximal left femur. Electronically Signed   By: Inez Catalina M.D.   On: 07/22/2018 10:04   Dg Hip Operative Unilat W Or W/o Pelvis Left  Result Date: 07/22/2018 CLINICAL DATA:  Left hip pinning EXAM: OPERATIVE LEFT HIP WITH PELVIS; DG C-ARM 61-120 MIN COMPARISON:  None. FLUOROSCOPY TIME:  Radiation Exposure Index (as provided by the fluoroscopic device): Unavailable If the device does not provide  the exposure index: Fluoroscopy Time:  2 minutes 5 seconds Number of Acquired Images:  2 FINDINGS: Three fixation screws are noted traversing the femoral neck into the femoral head. Increased sclerosis along the junction of the femoral neck and femoral head is noted. IMPRESSION: ORIF of proximal left femur. Electronically Signed   By: Inez Catalina M.D.   On: 07/22/2018 10:04        Scheduled Meds: . atorvastatin  40 mg Oral q1800  . carvedilol  3.125 mg Oral BID WC  . citalopram  20 mg Oral Daily  . docusate sodium  100 mg Oral BID  . enoxaparin (LOVENOX) injection  30 mg Subcutaneous Q24H  . ferrous sulfate  325 mg Oral Q breakfast  . pantoprazole  40 mg Oral Daily  . potassium chloride  40 mEq Oral Once  . vitamin B-12  500 mcg Oral Daily   Continuous Infusions:    LOS: 2 days    Time spent: 74min    Domenic Polite, MD [facility] Page via [email or web address], password [email or web address] After 7PM please contact night-coverage  07/23/2018, 1:27 PM

## 2018-07-23 NOTE — NC FL2 (Signed)
[place] LEVEL OF CARE SCREENING TOOL     IDENTIFICATION  Patient Name: Carl [name] Birthdate: 1950/11/12 Sex: male Admission Date (Current Location): 07/21/2018  [place] and [place] Number:  Carl [name] [id number] [place] and Address:  [facility] [place], [place] [address] [place], [place] [address]      Provider Number: [id number]  Attending Physician Name and Address:  Domenic Polite, MD  Relative Name and Phone Number:  Carl [name], daughter, [phone]    Current Level of Care: Hospital Recommended Level of Care: [place] Prior Approval Number:    Date Approved/Denied:   PASRR Number: pending  Discharge Plan: SNF    Current Diagnoses: Patient Active Problem List   Diagnosis Date Noted  . CAD (coronary artery disease) 07/21/2018  . Chronic systolic CHF (congestive heart failure) ([place]) 07/21/2018  . GERD (gastroesophageal reflux disease) 07/21/2018  . Depression 07/21/2018  . CKD (chronic kidney disease), stage III ([place]) 07/21/2018  . Iron deficiency anemia 07/21/2018  . Hypokalemia 07/21/2018  . Closed left hip fracture ([place]) 07/21/2018  . Fracture of femoral neck, left ([place]) 07/21/2018  . Lower extremity pain, bilateral 11/12/2017  . Hyperlipidemia 09/01/2017  . Essential hypertension 09/01/2017  . Prostate cancer screening 04/28/2017  . Hydrocele 04/28/2017  . Bradycardia   . Premature ventricular contraction   . Systolic dysfunction with acute on chronic heart failure ([place])   . Non-rheumatic mitral regurgitation   . Chronic systolic dysfunction of left ventricle 05/01/2015  . Cardiomyopathy, ischemic 04/26/2015  . Difficulty hearing 12/29/2014  . Acute on chronic systolic CHF (congestive heart failure) ([place]) 10/10/2014  . 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 Acute systolic heart failure ([employer]) 08/01/2014  . Protein-calorie malnutrition, severe ([place]) 07/29/2014  . ST elevation myocardial infarction (STEMI) of  anterolateral wall ([place]) 07/29/2014  . Cardiogenic shock ([place]) 07/29/2014  . Tobacco use 07/29/2014  . AKI (acute kidney injury) ([place]) 07/29/2014    Orientation RESPIRATION BLADDER Height & Weight     Self, Time, Situation, Place  Normal Continent, Indwelling catheter Weight: 175 lb (79.4 kg) Height:  6' (182.9 cm)  BEHAVIORAL SYMPTOMS/MOOD NEUROLOGICAL BOWEL NUTRITION STATUS      Continent Diet(see discharge summary)  AMBULATORY STATUS COMMUNICATION OF NEEDS Skin   Extensive Assist Verbally Surgical wounds(incision on hip with silicone dressing)                       Personal Care Assistance Level of Assistance  Bathing, Feeding, Dressing Bathing Assistance: Maximum assistance Feeding assistance: Independent Dressing Assistance: Maximum assistance     Functional Limitations Info  Sight, Hearing, Speech Sight Info: Adequate Hearing Info: Impaired Speech Info: Impaired    SPECIAL CARE FACTORS FREQUENCY  PT (By licensed PT), OT (By licensed OT)     PT Frequency: 5x week OT Frequency: 5x week            Contractures Contractures Info: Not present    Additional Factors Info  Code Status, Allergies, Psychotropic Code Status Info: Full Code Allergies Info: SPIRONOLACTONE  Psychotropic Info: citalopram (CELEXA) tablet 20 mg PO daily         Current Medications (07/23/2018):  This is the current hospital active medication list Current Facility-Administered Medications  Medication Dose Route Frequency Provider Last Rate Last Dose  . acetaminophen (TYLENOL) tablet 325-650 mg  325-650 mg Oral Q6H PRN [name], MD      . alum & mag hydroxide-simeth (MAALOX/MYLANTA) 200-200-20 MG/5ML suspension 30  mL  30 mL Oral Q4H PRN [name], MD      . atorvastatin (LIPITOR) tablet 40 mg  40 mg Oral q1800 [name], MD   40 mg at 07/22/18 1754  . bisacodyl (DULCOLAX) suppository 10 mg  10 mg Rectal Daily PRN [name], MD      . carvedilol (COREG) tablet  3.125 mg  3.125 mg Oral BID WC [name], MD   3.125 mg at 07/23/18 0903  . citalopram (CELEXA) tablet 20 mg  20 mg Oral Daily [name], MD   20 mg at 07/23/18 0903  . docusate sodium (COLACE) capsule 100 mg  100 mg Oral BID [name], MD   100 mg at 07/23/18 0905  . enoxaparin (LOVENOX) injection 30 mg  30 mg Subcutaneous Q24H [name], MD   30 mg at 07/23/18 0902  . ferrous sulfate tablet 325 mg  325 mg Oral Q breakfast [name], MD   325 mg at 07/23/18 0902  . HYDROcodone-acetaminophen (NORCO) 7.5-325 MG per tablet 1-2 tablet  1-2 tablet Oral Q4H PRN [name], MD   1 tablet at 07/23/18 1234  . HYDROcodone-acetaminophen (NORCO/VICODIN) 5-325 MG per tablet 1-2 tablet  1-2 tablet Oral Q4H PRN [name], MD      . magnesium citrate solution 1 Bottle  1 Bottle Oral Once PRN [name], MD      . menthol-cetylpyridinium (CEPACOL) lozenge 3 mg  1 lozenge Oral PRN [name], MD       Or  . phenol (CHLORASEPTIC) mouth spray 1 spray  1 spray Mouth/Throat PRN [name], MD      . metoCLOPramide (REGLAN) tablet 5-10 mg  5-10 mg Oral Q8H PRN [name], MD       Or  . metoCLOPramide (REGLAN) injection 5-10 mg  5-10 mg Intravenous Q8H PRN [name], MD      . morphine 2 MG/ML injection 0.5-1 mg  0.5-1 mg Intravenous Q2H PRN [name], MD      . ondansetron [facility]) tablet 4 mg  4 mg Oral Q6H PRN [name], MD       Or  . ondansetron [facility]) injection 4 mg  4 mg Intravenous Q6H PRN [name], MD      . pantoprazole (PROTONIX) EC tablet 40 mg  40 mg Oral Daily [name], MD   40 mg at 07/23/18 0902  . polyethylene glycol (MIRALAX / GLYCOLAX) packet 17 g  17 g Oral Daily PRN [name], MD      . vitamin B-12 (CYANOCOBALAMIN) tablet 500 mcg  500 mcg Oral Daily [name], MD   500 mcg at 07/23/18 0902     Discharge Medications: Please see discharge summary for a list of discharge medications.  Relevant Imaging  Results:  Relevant Lab Results:   Additional Information [id number] [place], [place]

## 2018-07-23 NOTE — Progress Notes (Signed)
PROGRESS NOTE    Carl [name]  GBT:[id number] DOB: December 10, 1950 DOA: 07/21/2018 PCP: [name], MD  Brief Narrative: Carl [name] is a 67 y.o. male with medical history significant of hypertension, hyperlipidemia, GERD, depression, anemia, CHF with EF 25%, AICD, CAD, CABG, psoriasis, peptic ulcer disease, vitiligo, CKD 3, who presented with left hip pain following a mechanical fall several days back.  Went orthopedic MD office was found to have a left hip fracture, sent to the emergency room. -Went to OR 11/28 morning, underwent left hip pinning  Assessment & Plan:     Closed left hip fracture ([place]) -Status post left hip pinning 1128 -Lovenox for DVT prophylaxis -PT evaluation, orthopedics following -discharge planning, would likely need SNF for short-term rehab  Chronic systolic CHF with EF of 71% -Clinically appears euvolemic -BP is soft, hold diuretics today  History of CAD, CABG, AICD -Last Myoview in 2018 without inducible ischemia -Stable  Chronic kidney disease stage III -Creatinine stable, hold diuretic and ARB at this time due to soft blood pressures  Hypertension -Continue Coreg, hold Cozaar  Iron deficiency anemia -Hemoglobin dropped a little as expected, monitor   Depression -Continue Celexa  DVT prophylaxis: Lovenox Code Status: Full code Family Communication: No family at bedside Disposition Plan: Anticipate will need rehab  Consultants:   Orthopedics Dr. Mardelle Matte   Procedures: Left hip pinning 11/28  Antimicrobials:    Subjective: -Mild hip discomfort, denies any dyspnea, p.o. intake okay  Objective: Vitals:   07/23/18 0503 07/23/18 0539 07/23/18 0859 07/23/18 1016  BP: 93/71 96/70 105/76 100/67  Pulse: 67 68 77 72  Resp: 16   18  Temp: 98.2 F (36.8 C)   97.9 F (36.6 C)  TempSrc: Oral   Oral  SpO2: 98%   94%  Weight:      Height:        Intake/Output Summary (Last 24 hours) at 07/23/2018 1316 Last data filed at 07/23/2018  1257 Gross per 24 hour  Intake 1440.65 ml  Output 1620 ml  Net -179.35 ml   Filed Weights   07/21/18 1808  Weight: 79.4 kg    Examination:  Gen: Awake, Alert, Oriented X 2, mild cognitive deficits noted HEENT: PERRLA, Neck supple, no JVD Lungs: Clear bilaterally CVS: S1-S2/irregularly irregular rhythm Abd: soft, Non tender, non distended, BS present Extremities: Left hip with dressing c/d/i Skin: no new rashes Psychiatry: Mood & affect appropriate.     Data Reviewed:   CBC: Recent Labs  Lab 07/21/18 1831 07/21/18 1917 07/23/18 0107  WBC 5.6  --  11.2*  NEUTROABS 4.2  --   --   HGB 12.9* 13.6 11.4*  HCT 39.3 40.0 35.1*  MCV 99.7  --  98.6  PLT 157  --  062   Basic Metabolic Panel: Recent Labs  Lab 07/21/18 1831 07/21/18 1917 07/22/18 0123 07/23/18 0107  NA 137 136  --  134*  K 3.2* 3.2*  --  3.4*  CL 101 99  --  99  CO2 25  --   --  25  GLUCOSE 156* 155*  --  126*  BUN 42* 44*  --  37*  CREATININE 1.53* 1.60*  --  1.60*  CALCIUM 9.2  --   --  8.9  MG  --   --  1.8  --    GFR: Estimated Creatinine Clearance: 49.2 mL/min (A) (by C-G formula based on SCr of 1.6 mg/dL (H)). Liver Function Tests: No results for input(s): AST, ALT, ALKPHOS,  BILITOT, PROT, ALBUMIN in the last 168 hours. No results for input(s): LIPASE, AMYLASE in the last 168 hours. No results for input(s): AMMONIA in the last 168 hours. Coagulation Profile: Recent Labs  Lab 07/21/18 1831  INR 1.21   Cardiac Enzymes: No results for input(s): CKTOTAL, CKMB, CKMBINDEX, TROPONINI in the last 168 hours. BNP (last 3 results) No results for input(s): PROBNP in the last 8760 hours. HbA1C: No results for input(s): HGBA1C in the last 72 hours. CBG: No results for input(s): GLUCAP in the last 168 hours. Lipid Profile: No results for input(s): CHOL, HDL, LDLCALC, TRIG, CHOLHDL, LDLDIRECT in the last 72 hours. Thyroid Function Tests: No results for input(s): TSH, T4TOTAL, FREET4, T3FREE,  THYROIDAB in the last 72 hours. Anemia Panel: No results for input(s): VITAMINB12, FOLATE, FERRITIN, TIBC, IRON, RETICCTPCT in the last 72 hours. Urine analysis:    Component Value Date/Time   COLORURINE STRAW (A) 06/11/2017 0555   APPEARANCEUR CLEAR (A) 06/11/2017 0555   APPEARANCEUR Clear 04/28/2017 1259   LABSPEC 1.014 06/11/2017 0555   PHURINE 5.0 06/11/2017 0555   GLUCOSEU NEGATIVE 06/11/2017 0555   HGBUR NEGATIVE 06/11/2017 0555   BILIRUBINUR NEGATIVE 06/11/2017 0555   BILIRUBINUR Negative 04/28/2017 1259   KETONESUR NEGATIVE 06/11/2017 0555   PROTEINUR NEGATIVE 06/11/2017 0555   NITRITE NEGATIVE 06/11/2017 0555   LEUKOCYTESUR NEGATIVE 06/11/2017 0555   LEUKOCYTESUR Negative 04/28/2017 1259   Sepsis Labs: @[email or web address] (procalcitonin:4,lacticidven:4)  ) Recent Results (from the past 240 hour(s))  Surgical pcr screen     Status: None   Collection Time: 07/21/18 10:56 PM  Result Value Ref Range Status   MRSA, PCR NEGATIVE NEGATIVE Final   Staphylococcus aureus NEGATIVE NEGATIVE Final    Comment: (NOTE) The Xpert SA Assay (FDA approved for NASAL specimens in patients 43 years of age and older), is one component of a comprehensive surveillance program. It is not intended to diagnose infection nor to guide or monitor treatment. Performed at [facility], [place] [address] [place], [place] [address]          Radiology Studies: Pelvis Portable  Result Date: 07/22/2018 CLINICAL DATA:  Status post left hip pinning EXAM: PORTABLE PELVIS 1-2 VIEWS COMPARISON:  Intraoperative films from earlier in the same day. FINDINGS: There are changes consistent with a fracture through the femoral neck with 3 fixation pins identified. No new focal abnormality is noted. Some deformity in the mid femoral shaft is seen consistent with prior healed fracture. IMPRESSION: Status post left hip pinning Electronically Signed   By: Inez Catalina M.D.   On: 07/22/2018 10:15   Dg C-arm 1-60  Min  Result Date: 07/22/2018 CLINICAL DATA:  Left hip pinning EXAM: OPERATIVE LEFT HIP WITH PELVIS; DG C-ARM 61-120 MIN COMPARISON:  None. FLUOROSCOPY TIME:  Radiation Exposure Index (as provided by the fluoroscopic device): Unavailable If the device does not provide the exposure index: Fluoroscopy Time:  2 minutes 5 seconds Number of Acquired Images:  2 FINDINGS: Three fixation screws are noted traversing the femoral neck into the femoral head. Increased sclerosis along the junction of the femoral neck and femoral head is noted. IMPRESSION: ORIF of proximal left femur. Electronically Signed   By: Inez Catalina M.D.   On: 07/22/2018 10:04   Dg Hip Operative Unilat W Or W/o Pelvis Left  Result Date: 07/22/2018 CLINICAL DATA:  Left hip pinning EXAM: OPERATIVE LEFT HIP WITH PELVIS; DG C-ARM 61-120 MIN COMPARISON:  None. FLUOROSCOPY TIME:  Radiation Exposure Index (as provided by the  fluoroscopic device): Unavailable If the device does not provide the exposure index: Fluoroscopy Time:  2 minutes 5 seconds Number of Acquired Images:  2 FINDINGS: Three fixation screws are noted traversing the femoral neck into the femoral head. Increased sclerosis along the junction of the femoral neck and femoral head is noted. IMPRESSION: ORIF of proximal left femur. Electronically Signed   By: Inez Catalina M.D.   On: 07/22/2018 10:04        Scheduled Meds: . atorvastatin  40 mg Oral q1800  . carvedilol  3.125 mg Oral BID WC  . citalopram  20 mg Oral Daily  . docusate sodium  100 mg Oral BID  . enoxaparin (LOVENOX) injection  30 mg Subcutaneous Q24H  . ferrous sulfate  325 mg Oral Q breakfast  . pantoprazole  40 mg Oral Daily  . vitamin B-12  500 mcg Oral Daily   Continuous Infusions:    LOS: 2 days    Time spent: 38min    Domenic Polite, MD [facility] Page via [email or web address], password [email or web address] After 7PM please contact night-coverage  07/23/2018, 1:16 PM
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
## 2018-07-24 LAB — CBC
HCT: 35.3 % — ABNORMAL LOW (ref 39.0–52.0)
HEMOGLOBIN: 11.2 g/dL — AB (ref 13.0–17.0)
MCH: 31.7 pg (ref 26.0–34.0)
MCHC: 31.7 g/dL (ref 30.0–36.0)
MCV: 100 fL (ref 80.0–100.0)
Platelets: 163 10*3/uL (ref 150–400)
RBC: 3.53 MIL/uL — AB (ref 4.22–5.81)
RDW: 14.4 % (ref 11.5–15.5)
WBC: 8.9 10*3/uL (ref 4.0–10.5)
nRBC: 0 % (ref 0.0–0.2)

## 2018-07-24 LAB — BASIC METABOLIC PANEL
Anion gap: 11 (ref 5–15)
BUN: 38 mg/dL — ABNORMAL HIGH (ref 8–23)
CHLORIDE: 95 mmol/L — AB (ref 98–111)
CO2: 28 mmol/L (ref 22–32)
Calcium: 9.1 mg/dL (ref 8.9–10.3)
Creatinine, Ser: 1.65 mg/dL — ABNORMAL HIGH (ref 0.61–1.24)
GFR calc Af Amer: 49 mL/min — ABNORMAL LOW (ref >60)
GFR calc non Af Amer: 42 mL/min — ABNORMAL LOW (ref >60)
Glucose, Bld: 106 mg/dL — ABNORMAL HIGH (ref 70–99)
Potassium: 3.8 mmol/L (ref 3.5–5.1)
Sodium: 134 mmol/L — ABNORMAL LOW (ref 135–145)

## 2018-07-24 MED ORDER — HYDROCODONE-ACETAMINOPHEN 5-325 MG PO TABS
1.0000 | ORAL_TABLET | ORAL | Status: DC | PRN
  Administered 2018-07-24: 1 via ORAL
  Administered 2018-07-26: 2 via ORAL
  Administered 2018-07-26 – 2018-07-27 (×2): 1 via ORAL
  Administered 2018-07-27: 2 via ORAL
  Filled 2018-07-24: qty 2
  Filled 2018-07-24: qty 1
  Filled 2018-07-24: qty 2
  Filled 2018-07-24 (×2): qty 1

## 2018-07-24 NOTE — Progress Notes (Signed)
SPORTS MEDICINE AND JOINT REPLACEMENT  Lara Mulch, MD    Carlyon Shadow, PA-C [place], [place], [place]  [address]                             [phone]   PROGRESS NOTE  Subjective:  patient sitting up at bedside eating breakfast, pain is mild   Tolerating Diet: yes         Patient reports pain as 3 on 0-10 scale.    Objective: Vital signs in last 24 hours:    Patient Vitals for the past 24 hrs:  BP Temp Temp src Pulse Resp SpO2  07/24/18 0556 99/71 97.6 F (36.4 C) Oral 75 - 97 %  07/23/18 2206 94/66 - - 75 - -  07/23/18 1939 (!) 86/64 97.8 F (36.6 C) Oral 77 - 99 %  07/23/18 1837 (!) 89/57 - - 69 - -  07/23/18 1643 90/62 - - - - -  07/23/18 1639 (!) 89/62 - - 67 - -  07/23/18 1638 (!) 79/60 - - 67 - -  07/23/18 1450 (!) 84/58 98.1 F (36.7 C) Oral 69 18 96 %  07/23/18 1016 100/67 97.9 F (36.6 C) Oral 72 18 94 %  07/23/18 0859 105/76 - - 77 - -    @flow {1959:LAST@   Intake/Output from previous day:   11/29 0701 - 11/30 0700 In: 1080 [P.O.:1080] Out: 600 [Urine:600]   Intake/Output this shift:   No intake/output data recorded.   Intake/Output      11/29 0701 - 11/30 0700 11/30 0701 - 12/01 0700   P.O. 1080    I.V. (mL/kg)     Other     IV Piggyback     Total Intake(mL/kg) 1080 (13.6)    Urine (mL/kg/hr) 600 (0.3)    Blood     Total Output 600    Net +480            LABORATORY DATA: Recent Labs    07/21/18 1831 07/21/18 1917 07/23/18 0107 07/24/18 0424  WBC 5.6  --  11.2* 8.9  HGB 12.9* 13.6 11.4* 11.2*  HCT 39.3 40.0 35.1* 35.3*  PLT 157  --  160 163   Recent Labs    07/21/18 1831 07/21/18 1917 07/23/18 0107 07/24/18 0424  NA 137 136 134* 134*  K 3.2* 3.2* 3.4* 3.8  CL 101 99 99 95*  CO2 25  --  25 28  BUN 42* 44* 37* 38*  CREATININE 1.53* 1.60* 1.60* 1.65*  GLUCOSE 156* 155* 126* 106*  CALCIUM 9.2  --  8.9 9.1   Lab Results  Component Value Date   INR 1.21 07/21/2018   INR 1.27 08/03/2014   INR 1.25  07/27/2014    Examination:  General appearance: alert, cooperative and no distress Extremities: extremities normal, atraumatic, no cyanosis or edema  Wound Exam: clean, dry, intact   Drainage:  None: wound tissue dry  Motor Exam: Quadriceps and Hamstrings Intact  Sensory Exam: Superficial Peroneal, Deep Peroneal and Tibial normal   Assessment:    2 Days Post-Op  Procedure(s) (LRB): CANNULATED HIP PINNING (Left)  ADDITIONAL DIAGNOSIS:  Principal Problem:   Closed left hip fracture (HCC) Active Problems:   S/P CABG x 5   Hyperlipidemia   Essential hypertension   CAD (coronary artery disease)   Chronic systolic CHF (congestive heart failure) (HCC)   GERD (gastroesophageal reflux disease)   Depression   CKD (  chronic kidney disease), stage III ([place])   Iron deficiency anemia   Hypokalemia     Plan: Physical Therapy as ordered Non Weight Bearing (NWB)  DVT Prophylaxis:  Lovenox  Advance diet  Up with therapy  Continue plan per medicine   Donia Ast 07/24/2018, 8:02 AM

## 2018-07-24 NOTE — Progress Notes (Signed)
PROGRESS NOTE    CHEO SELVEY  OQH:[id number] DOB: 05/17/1951 DOA: 07/21/2018 PCP: [name], MD  Brief Narrative: Carl [name] is a 67 y.o. male with medical history significant of hypertension, hyperlipidemia, GERD, depression, anemia, CHF with EF 25%, AICD, CAD, CABG, psoriasis, peptic ulcer disease, vitiligo, CKD 3, who presented with left hip pain following a mechanical fall several days back.  Went orthopedic MD office was found to have a left hip fracture, sent to the emergency room. -Went to OR 11/28 morning, underwent left hip pinning -Hopeful for CIR  Assessment & Plan:     Closed left hip fracture ([place]) -Status post left hip pinning 11/28 -Lovenox for DVT prophylaxis -PT evaluation completed, SNF recommended -Discharge planning, social work following  Chronic systolic CHF with EF of 54% -Clinically appears euvolemic -BP is soft, hold diuretics  History of CAD, CABG, AICD -Last Myoview in 2018 without inducible ischemia -Stable  Chronic kidney disease stage III -Creatinine stable, hold diuretic and ARB at this time due to soft blood pressures  Hypotension -Asymptomatic, hemoglobin is relatively stable -Cozaar discontinued, continue low-dose Coreg due to severe cardiomyopathy and NSVT  Iron deficiency anemia -Hb stable at 11  Depression -Continue Celexa  DVT prophylaxis: Lovenox Code Status: Full code Family Communication: no family at bedside Disposition Plan:SNF Monday   Consultants:   Orthopedics Dr. Mardelle Matte   Procedures: Left hip pinning 11/28  Antimicrobials:    Subjective: -Pressure in the 90s, completely asymptomatic from this -Denies any dizziness or lightheadedness, no chest pain or shortness of breath  Objective: Vitals:   07/24/18 0556 07/24/18 0811 07/24/18 0813 07/24/18 1039  BP: 99/71 94/67 93/65  (!) 90/50  Pulse: 75 79 79   Resp:      Temp: 97.6 F (36.4 C) 98.4 F (36.9 C)    TempSrc: Oral Oral    SpO2: 97% 97%      Weight:      Height:        Intake/Output Summary (Last 24 hours) at 07/24/2018 1320 Last data filed at 07/24/2018 0813 Gross per 24 hour  Intake 360 ml  Output 650 ml  Net -290 ml   Filed Weights   07/21/18 1808  Weight: 79.4 kg    Examination: Gen: Awake, Alert, Oriented X 2, mild cognitive deficits noted  HEENT: PERRLA, Neck supple, no JVD Lungs: Good air movement bilaterally, CTAB CVS: RRR,No Gallops,Rubs or new Murmurs Abd: soft, Non tender, non distended, BS present Extremities: Left hip with dressing Skin: no new rashes Psychiatry: Mood & affect appropriate.     Data Reviewed:   CBC: Recent Labs  Lab 07/21/18 1831 07/21/18 1917 07/23/18 0107 07/24/18 0424  WBC 5.6  --  11.2* 8.9  NEUTROABS 4.2  --   --   --   HGB 12.9* 13.6 11.4* 11.2*  HCT 39.3 40.0 35.1* 35.3*  MCV 99.7  --  98.6 100.0  PLT 157  --  160 656   Basic Metabolic Panel: Recent Labs  Lab 07/21/18 1831 07/21/18 1917 07/22/18 0123 07/23/18 0107 07/24/18 0424  NA 137 136  --  134* 134*  K 3.2* 3.2*  --  3.4* 3.8  CL 101 99  --  99 95*  CO2 25  --   --  25 28  GLUCOSE 156* 155*  --  126* 106*  BUN 42* 44*  --  37* 38*  CREATININE 1.53* 1.60*  --  1.60* 1.65*  CALCIUM 9.2  --   --  8.9  9.1  MG  --   --  1.8  --   --    GFR: Estimated Creatinine Clearance: 47.7 mL/min (A) (by C-G formula based on SCr of 1.65 mg/dL (H)). Liver Function Tests: No results for input(s): AST, ALT, ALKPHOS, BILITOT, PROT, ALBUMIN in the last 168 hours. No results for input(s): LIPASE, AMYLASE in the last 168 hours. No results for input(s): AMMONIA in the last 168 hours. Coagulation Profile: Recent Labs  Lab 07/21/18 1831  INR 1.21   Cardiac Enzymes: No results for input(s): CKTOTAL, CKMB, CKMBINDEX, TROPONINI in the last 168 hours. BNP (last 3 results) No results for input(s): PROBNP in the last 8760 hours. HbA1C: No results for input(s): HGBA1C in the last 72 hours. CBG: No results for  input(s): GLUCAP in the last 168 hours. Lipid Profile: No results for input(s): CHOL, HDL, LDLCALC, TRIG, CHOLHDL, LDLDIRECT in the last 72 hours. Thyroid Function Tests: No results for input(s): TSH, T4TOTAL, FREET4, T3FREE, THYROIDAB in the last 72 hours. Anemia Panel: No results for input(s): VITAMINB12, FOLATE, FERRITIN, TIBC, IRON, RETICCTPCT in the last 72 hours. Urine analysis:    Component Value Date/Time   COLORURINE STRAW (A) 06/11/2017 0555   APPEARANCEUR CLEAR (A) 06/11/2017 0555   APPEARANCEUR Clear 04/28/2017 1259   LABSPEC 1.014 06/11/2017 0555   PHURINE 5.0 06/11/2017 0555   GLUCOSEU NEGATIVE 06/11/2017 0555   HGBUR NEGATIVE 06/11/2017 0555   BILIRUBINUR NEGATIVE 06/11/2017 0555   BILIRUBINUR Negative 04/28/2017 1259   KETONESUR NEGATIVE 06/11/2017 0555   PROTEINUR NEGATIVE 06/11/2017 0555   NITRITE NEGATIVE 06/11/2017 0555   LEUKOCYTESUR NEGATIVE 06/11/2017 0555   LEUKOCYTESUR Negative 04/28/2017 1259   Sepsis Labs: @LABRCNTIP (procalcitonin:4,lacticidven:4)  ) Recent Results (from the past 240 hour(s))  Surgical pcr screen     Status: None   Collection Time: 07/21/18 10:56 PM  Result Value Ref Range Status   MRSA, PCR NEGATIVE NEGATIVE Final   Staphylococcus aureus NEGATIVE NEGATIVE Final    Comment: (NOTE) The Xpert SA Assay (FDA approved for NASAL specimens in patients 66 years of age and older), is one component of a comprehensive surveillance program. It is not intended to diagnose infection nor to guide or monitor treatment. Performed at [facility], [place] [address], [place], [place] [address]          Radiology Studies: No results found.      Scheduled Meds: . atorvastatin  40 mg Oral q1800  . carvedilol  3.125 mg Oral BID WC  . citalopram  20 mg Oral Daily  . docusate sodium  100 mg Oral BID  . enoxaparin (LOVENOX) injection  30 mg Subcutaneous Q24H  . ferrous sulfate  325 mg Oral Q breakfast  . pantoprazole  40 mg Oral  Daily  . vitamin B-12  500 mcg Oral Daily   Continuous Infusions:    LOS: 3 days    Time spent: 25min    Carl Polite, MD [facility] Page via [email or web address], password [email or web address] After 7PM please contact night-coverage  07/24/2018, 1:20 PM

## 2018-07-24 NOTE — Progress Notes (Signed)
CCMD notified RN that pt had 5 runs of Vtach. Pt is back to NSR; HR 78; asymptomatic.[facility] is notified. No orders were given. Nursing will continue to monitor.

## 2018-07-25 LAB — CBC
HCT: 34 % — ABNORMAL LOW (ref 39.0–52.0)
Hemoglobin: 11 g/dL — ABNORMAL LOW (ref 13.0–17.0)
MCH: 32.3 pg (ref 26.0–34.0)
MCHC: 32.4 g/dL (ref 30.0–36.0)
MCV: 99.7 fL (ref 80.0–100.0)
Platelets: 152 10*3/uL (ref 150–400)
RBC: 3.41 MIL/uL — ABNORMAL LOW (ref 4.22–5.81)
RDW: 14.3 % (ref 11.5–15.5)
WBC: 6.7 10*3/uL (ref 4.0–10.5)
nRBC: 0 % (ref 0.0–0.2)

## 2018-07-25 LAB — BASIC METABOLIC PANEL
Anion gap: 13 (ref 5–15)
BUN: 29 mg/dL — ABNORMAL HIGH (ref 8–23)
CALCIUM: 9.2 mg/dL (ref 8.9–10.3)
CO2: 27 mmol/L (ref 22–32)
Chloride: 97 mmol/L — ABNORMAL LOW (ref 98–111)
Creatinine, Ser: 1.28 mg/dL — ABNORMAL HIGH (ref 0.61–1.24)
GFR calc Af Amer: >60 mL/min (ref >60)
GFR calc non Af Amer: 58 mL/min — ABNORMAL LOW (ref >60)
Glucose, Bld: 114 mg/dL — ABNORMAL HIGH (ref 70–99)
Potassium: 3.5 mmol/L (ref 3.5–5.1)
SODIUM: 137 mmol/L (ref 135–145)

## 2018-07-25 NOTE — Progress Notes (Signed)
SPORTS MEDICINE AND JOINT REPLACEMENT  Lara Mulch, MD    Carlyon Shadow, PA-C [place], [place], [place]  [address]                             [phone]   PROGRESS NOTE  Subjective:  Patient watching TV comfortably, pain is mild   Tolerating Diet: yes         Patient reports pain as 3 on 0-10 scale.    Objective: Vital signs in last 24 hours:    Patient Vitals for the past 24 hrs:  BP Temp Temp src Pulse SpO2  07/25/18 0407 96/71 98.2 F (36.8 C) Oral 70 96 %  07/24/18 2031 95/79 97.8 F (36.6 C) Oral 78 96 %  07/24/18 1624 93/65 98.7 F (37.1 C) Oral 70 97 %  07/24/18 1330 91/72 98.2 F (36.8 C) Oral 72 96 %  07/24/18 1039 (!) 90/50 - - - -    @flow {1959:LAST@   Intake/Output from previous day:   11/30 0701 - 12/01 0700 In: 320 [P.O.:320] Out: 1570 [Urine:1570]   Intake/Output this shift:   No intake/output data recorded.   Intake/Output      11/30 0701 - 12/01 0700 12/01 0701 - 12/02 0700   P.O. 320    Total Intake(mL/kg) 320 (4)    Urine (mL/kg/hr) 1570 (0.8)    Stool 0    Total Output 1570    Net -1250         Urine Occurrence 1 x    Stool Occurrence 1 x       LABORATORY DATA: Recent Labs    07/21/18 1831 07/21/18 1917 07/23/18 0107 07/24/18 0424 07/25/18 0657  WBC 5.6  --  11.2* 8.9 6.7  HGB 12.9* 13.6 11.4* 11.2* 11.0*  HCT 39.3 40.0 35.1* 35.3* 34.0*  PLT 157  --  160 163 152   Recent Labs    07/21/18 1831 07/21/18 1917 07/23/18 0107 07/24/18 0424 07/25/18 0657  NA 137 136 134* 134* 137  K 3.2* 3.2* 3.4* 3.8 3.5  CL 101 99 99 95* 97*  CO2 25  --  25 28 27   BUN 42* 44* 37* 38* 29*  CREATININE 1.53* 1.60* 1.60* 1.65* 1.28*  GLUCOSE 156* 155* 126* 106* 114*  CALCIUM 9.2  --  8.9 9.1 9.2   Lab Results  Component Value Date   INR 1.21 07/21/2018   INR 1.27 08/03/2014   INR 1.25 07/27/2014    Examination:  General appearance: alert, cooperative and no distress Extremities: extremities normal, atraumatic, no  cyanosis or edema  Wound Exam: clean, dry, intact   Drainage:  None: wound tissue dry  Motor Exam: Quadriceps and Hamstrings Intact  Sensory Exam: Superficial Peroneal, Deep Peroneal and Tibial normal   Assessment:    3 Days Post-Op  Procedure(s) (LRB): CANNULATED HIP PINNING (Left)  ADDITIONAL DIAGNOSIS:  Principal Problem:   Closed left hip fracture (HCC) Active Problems:   S/P CABG x 5   Hyperlipidemia   Essential hypertension   CAD (coronary artery disease)   Chronic systolic CHF (congestive heart failure) (HCC)   GERD (gastroesophageal reflux disease)   Depression   CKD (chronic kidney disease), stage III (HCC)   Iron deficiency anemia   Hypokalemia     Plan: Physical Therapy as ordered Non Weight Bearing (NWB)  DVT Prophylaxis:  Lovenox  DISCHARGE PLAN: Skilled Nursing Facility/Rehab  Advance diet  NWB with therapy  Continue plan per medicine  Donia Ast 07/25/2018, 9:06 AM

## 2018-07-25 NOTE — Plan of Care (Signed)
  Problem: Activity: Goal: Risk for activity intolerance will decrease Outcome: Progressing   Problem: Nutrition: Goal: Adequate nutrition will be maintained Outcome: Progressing   Problem: Coping: Goal: Level of anxiety will decrease Outcome: Progressing   Problem: Safety: Goal: Ability to remain free from injury will improve Outcome: Progressing   Problem: Skin Integrity: Goal: Risk for impaired skin integrity will decrease Outcome: Progressing   

## 2018-07-25 NOTE — Progress Notes (Signed)
Pt alert and oriented.  Pt is very HOH.  No complaints of pain or discomfort.  Watching TV.  Bed in low position,

## 2018-07-25 NOTE — Progress Notes (Signed)
PROGRESS NOTE    TIMTOHY BROSKI  CNO:[id number] DOB: 04/08/1951 DOA: 07/21/2018 PCP: [name], MD  Brief Narrative: TREAVON CASTILLEJA is a 67 y.o. male with medical history significant of hypertension, hyperlipidemia, GERD, depression, anemia, CHF with EF 25%, AICD, CAD, CABG, psoriasis, peptic ulcer disease, vitiligo, CKD 3, who presented with left hip pain following a mechanical fall several days back.  Went orthopedic MD office was found to have a left hip fracture, sent to the emergency room. -Went to OR 11/28 morning, underwent left hip pinning -Hopeful for CIR  Assessment & Plan:     Closed left hip fracture ([place]) -Status post left hip pinning 11/28 -Continue Lovenox for DVT prophylaxis -PT evaluation completed, SNF recommended -Discharge planning, social work following -Remains medically stable for discharge, awaiting PASSAR for SNF  Chronic systolic CHF with EF of 29% -Clinically appears euvolemic -BP is soft, hold diuretics  History of CAD, CABG, AICD -Last Myoview in 2018 without inducible ischemia -Stable  Chronic kidney disease stage III -Creatinine stable, hold diuretic and ARB at this time due to soft blood pressures  Hypotension -Asymptomatic, hemoglobin is relatively stable -Cozaar discontinued, continue low-dose Coreg due to severe cardiomyopathy and NSVT  Iron deficiency anemia -Hb stable at 11  Depression -Continue Celexa  DVT prophylaxis: Lovenox Code Status: Full code Family Communication: no family at bedside Disposition Plan:SNF when bed available  Consultants:   Orthopedics Dr. Mardelle Matte   Procedures: Left hip pinning 11/28  Antimicrobials:    Subjective: -Feels well, awaiting rehab  Objective: Vitals:   07/24/18 1330 07/24/18 1624 07/24/18 2031 07/25/18 0407  BP: 91/72 93/65 95/79  96/71  Pulse: 72 70 78 70  Resp:      Temp: 98.2 F (36.8 C) 98.7 F (37.1 C) 97.8 F (36.6 C) 98.2 F (36.8 C)  TempSrc: Oral Oral Oral Oral    SpO2: 96% 97% 96% 96%  Weight:      Height:        Intake/Output Summary (Last 24 hours) at 07/25/2018 1322 Last data filed at 07/25/2018 0410 Gross per 24 hour  Intake -  Output 700 ml  Net -700 ml   Filed Weights   07/21/18 1808  Weight: 79.4 kg    Examination: Gen: Awake, Alert, Oriented X 3, mild cognitive deficits noted HEENT: PERRLA, Neck supple, no JVD Lungs: Good air movement bilaterally, CTAB CVS: RRR,No Gallops,Rubs or new Murmurs Abd: soft, Non tender, non distended, BS present Extremities: left hip with dressing skin: no new rashes Psychiatry: Mood & affect appropriate.     Data Reviewed:   CBC: Recent Labs  Lab 07/21/18 1831 07/21/18 1917 07/23/18 0107 07/24/18 0424 07/25/18 0657  WBC 5.6  --  11.2* 8.9 6.7  NEUTROABS 4.2  --   --   --   --   HGB 12.9* 13.6 11.4* 11.2* 11.0*  HCT 39.3 40.0 35.1* 35.3* 34.0*  MCV 99.7  --  98.6 100.0 99.7  PLT 157  --  160 163 476   Basic Metabolic Panel: Recent Labs  Lab 07/21/18 1831 07/21/18 1917 07/22/18 0123 07/23/18 0107 07/24/18 0424 07/25/18 0657  NA 137 136  --  134* 134* 137  K 3.2* 3.2*  --  3.4* 3.8 3.5  CL 101 99  --  99 95* 97*  CO2 25  --   --  25 28 27   GLUCOSE 156* 155*  --  126* 106* 114*  BUN 42* 44*  --  37* 38* 29*  CREATININE 1.53*  1.60*  --  1.60* 1.65* 1.28*  CALCIUM 9.2  --   --  8.9 9.1 9.2  MG  --   --  1.8  --   --   --    GFR: Estimated Creatinine Clearance: 61.5 mL/min (A) (by C-G formula based on SCr of 1.28 mg/dL (H)). Liver Function Tests: No results for input(s): AST, ALT, ALKPHOS, BILITOT, PROT, ALBUMIN in the last 168 hours. No results for input(s): LIPASE, AMYLASE in the last 168 hours. No results for input(s): AMMONIA in the last 168 hours. Coagulation Profile: Recent Labs  Lab 07/21/18 1831  INR 1.21   Cardiac Enzymes: No results for input(s): CKTOTAL, CKMB, CKMBINDEX, TROPONINI in the last 168 hours. BNP (last 3 results) No results for input(s): PROBNP  in the last 8760 hours. HbA1C: No results for input(s): HGBA1C in the last 72 hours. CBG: No results for input(s): GLUCAP in the last 168 hours. Lipid Profile: No results for input(s): CHOL, HDL, LDLCALC, TRIG, CHOLHDL, LDLDIRECT in the last 72 hours. Thyroid Function Tests: No results for input(s): TSH, T4TOTAL, FREET4, T3FREE, THYROIDAB in the last 72 hours. Anemia Panel: No results for input(s): VITAMINB12, FOLATE, FERRITIN, TIBC, IRON, RETICCTPCT in the last 72 hours. Urine analysis:    Component Value Date/Time   COLORURINE STRAW (A) 06/11/2017 0555   APPEARANCEUR CLEAR (A) 06/11/2017 0555   APPEARANCEUR Clear 04/28/2017 1259   LABSPEC 1.014 06/11/2017 0555   PHURINE 5.0 06/11/2017 0555   GLUCOSEU NEGATIVE 06/11/2017 0555   HGBUR NEGATIVE 06/11/2017 0555   BILIRUBINUR NEGATIVE 06/11/2017 0555   BILIRUBINUR Negative 04/28/2017 1259   KETONESUR NEGATIVE 06/11/2017 0555   PROTEINUR NEGATIVE 06/11/2017 0555   NITRITE NEGATIVE 06/11/2017 0555   LEUKOCYTESUR NEGATIVE 06/11/2017 0555   LEUKOCYTESUR Negative 04/28/2017 1259   Sepsis Labs: @LABRCNTIP (procalcitonin:4,lacticidven:4)  ) Recent Results (from the past 240 hour(s))  Surgical pcr screen     Status: None   Collection Time: 07/21/18 10:56 PM  Result Value Ref Range Status   MRSA, PCR NEGATIVE NEGATIVE Final   Staphylococcus aureus NEGATIVE NEGATIVE Final    Comment: (NOTE) The Xpert SA Assay (FDA approved for NASAL specimens in patients 65 years of age and older), is one component of a comprehensive surveillance program. It is not intended to diagnose infection nor to guide or monitor treatment. Performed at [facility], [place] [address] [place], [place] [address]          Radiology Studies: No results found.      Scheduled Meds: . atorvastatin  40 mg Oral q1800  . carvedilol  3.125 mg Oral BID WC  . citalopram  20 mg Oral Daily  . docusate sodium  100 mg Oral BID  . enoxaparin (LOVENOX)  injection  30 mg Subcutaneous Q24H  . ferrous sulfate  325 mg Oral Q breakfast  . pantoprazole  40 mg Oral Daily  . vitamin B-12  500 mcg Oral Daily   Continuous Infusions:    LOS: 4 days    Time spent: 19min    Domenic Polite, MD [facility] Page via [email or web address], password [email or web address] After 7PM please contact night-coverage  07/25/2018, 1:22 PM

## 2018-07-25 NOTE — Plan of Care (Signed)
  Problem: Clinical Measurements: Goal: Ability to maintain clinical measurements within normal limits will improve Outcome: Progressing Goal: Will remain free from infection Outcome: Progressing Goal: Respiratory complications will improve Outcome: Progressing Goal: Cardiovascular complication will be avoided Outcome: Progressing   Problem: Activity: Goal: Risk for activity intolerance will decrease Outcome: Progressing   Problem: Coping: Goal: Level of anxiety will decrease Outcome: Progressing

## 2018-07-26 ENCOUNTER — Encounter: Payer: Medicare Other | Admitting: Internal Medicine

## 2018-07-26 MED ORDER — ENOXAPARIN SODIUM 40 MG/0.4ML ~~LOC~~ SOLN
40.0000 mg | SUBCUTANEOUS | Status: DC
  Administered 2018-07-27: 40 mg via SUBCUTANEOUS
  Filled 2018-07-26: qty 0.4

## 2018-07-26 NOTE — Clinical Social Work Note (Signed)
Clinical Social Work Assessment  Patient Details  Name: Carl [name] MRN: [id number] Date of Birth: Mar 07, 1951  Date of referral:  07/26/18               Reason for consult:  Discharge Planning                Permission sought to share information with:  Case Manager, Facility Sport and exercise psychologist, Family Supports Permission granted to share information::  Yes, Verbal Permission Granted  Name::     [name]  Agency::  SNFs  Relationship::  brother  Contact Information:  [phone]  Housing/Transportation Living arrangements for the past 2 months:  Single Family Home Source of Information:  Patient Patient Interpreter Needed:  None Criminal Activity/Legal Involvement Pertinent to Current Situation/Hospitalization:  No - Comment as needed Significant Relationships:  Siblings Lives with:  Self Do you feel safe going back to the place where you live?  No Need for family participation in patient care:  Yes (Comment)  Care giving concerns:  CSW received referral for possible SNF placement at time of discharge. Spoke with patient regarding possibility of SNF placement . Patient's brother and famiy are currently unable to care for her at their home given patient's current needs and fall risk.  Patient and brother [name] expressed understanding of PT recommendation and are agreeable to SNF placement at time of discharge. CSW to continue to follow and assist with discharge planning needs.     Social Worker assessment / plan:  Spoke with patient and brother [name]  concerning possibility of rehab at [facility] before returning home.    Employment status:  Retired Nurse, adult PT Recommendations:  [place] / Referral to community resources:  [place]  Patient/Family's Response to care:  Patient and  Ruffin   recognize need for rehab before returning home and are agreeable to a SNF in [place]. They report preference for   [facility]  . CSW explained insurance authorization process. Patient's family reported that they want patient to get stronger to be able to come back home.    Patient/Family's Understanding of and Emotional Response to Diagnosis, Current Treatment, and Prognosis:  Patient/family is realistic regarding therapy needs and expressed being hopeful for SNF placement. Patient expressed understanding of CSW role and discharge process as well as medical condition. No questions/concerns about plan or treatment.    Emotional Assessment Appearance:  Appears stated age Attitude/Demeanor/Rapport:  Gracious Affect (typically observed):  Accepting Orientation:  Oriented to Self, Oriented to Place, Oriented to  Time, Oriented to Situation Alcohol / Substance use:  Not Applicable Psych involvement (Current and /or in the community):  No (Comment)  Discharge Needs  Concerns to be addressed:  Discharge Planning Concerns Readmission within the last 30 days:  No Current discharge risk:  Dependent with Mobility Barriers to Discharge:  Continued Medical Work up   [employer], LCSW 07/26/2018, 10:32 AM

## 2018-07-26 NOTE — Progress Notes (Signed)
Physical Therapy Treatment Patient Details Name: Carl [name] MRN: [id number] DOB: 04-17-1951 Today's Date: 07/26/2018    History of Present Illness Pt is 67 y.o. male s/p left cannulated hip pinning (07/22/18) secondary to left hip fracture sustained from a fall at home. PMH significant for HTN, hyperlipidemia, GERD, depression, anemia, CHF with EF 25%, AICD, CAD, CABG, psoriasis, peptic ulcer disease, vitiligo, CKD 3, and noted leg length discrepancy.    PT Comments    Steady progress with mobility. Pt ambulated 25 feet with RW min guard assist. Pt able to maintain NWB LLE during stance/ambulation. He performed LE exercises with feet elevated in recliner. Pt very motivated and willing to participate in therapy. Current POC remains appropriate.    Follow Up Recommendations  SNF;Supervision - Intermittent     Equipment Recommendations  Other (comment)(defer to next venue)    Recommendations for Other Services       Precautions / Restrictions Precautions Precautions: Fall Restrictions LLE Weight Bearing: Non weight bearing    Mobility  Bed Mobility               General bed mobility comments: Pt received in recliner.  Transfers Overall transfer level: Needs assistance Equipment used: Rolling walker (2 wheeled) Transfers: Sit to/from Stand Sit to Stand: Min guard         General transfer comment: min guard for safety, increased time to stabilize initial standing balance  Ambulation/Gait Ambulation/Gait assistance: Min guard Gait Distance (Feet): 25 Feet Assistive device: Rolling walker (2 wheeled) Gait Pattern/deviations: Step-to pattern Gait velocity: decreased Gait velocity interpretation: <1.31 ft/sec, indicative of household ambulator General Gait Details: Pt able to maintain NWB LLE. Fatigues quickly.   Stairs             Wheelchair Mobility    Modified Rankin (Stroke Patients Only)       Balance Overall balance assessment: Needs  assistance Sitting-balance support: Feet supported;No upper extremity supported Sitting balance-Leahy Scale: Good     Standing balance support: Bilateral upper extremity supported Standing balance-Leahy Scale: Poor Standing balance comment: reliant on RW due to LLE NWB status                            Cognition Arousal/Alertness: Awake/alert Behavior During Therapy: WFL for tasks assessed/performed Overall Cognitive Status: Within Functional Limits for tasks assessed                                 General Comments: very [facility]      Exercises General Exercises - Lower Extremity Ankle Circles/Pumps: AROM;Both;10 reps Quad Sets: AROM;Both;10 reps Heel Slides: AROM;Left;10 reps Hip ABduction/ADduction: AROM;Left;10 reps    General Comments        Pertinent Vitals/Pain Pain Assessment: Faces Faces Pain Scale: Hurts little more Pain Location: L knee and ankle Pain Descriptors / Indicators: Guarding;Grimacing;Discomfort Pain Intervention(s): Premedicated before session;Monitored during session    Home Living                      Prior Function            PT Goals (current goals can now be found in the care plan section) Acute Rehab PT Goals Patient Stated Goal: go home PT Goal Formulation: With patient Time For Goal Achievement: 08/06/18 Potential to Achieve Goals: Fair Progress towards PT goals: Progressing toward goals    Frequency  Min 3X/week      PT Plan Current plan remains appropriate    Co-evaluation              AM-PAC PT "6 Clicks" Mobility   Outcome Measure  Help needed turning from your back to your side while in a flat bed without using bedrails?: A Little Help needed moving from lying on your back to sitting on the side of a flat bed without using bedrails?: A Little Help needed moving to and from a bed to a chair (including a wheelchair)?: A Little Help needed standing up from a chair using your arms  (e.g., wheelchair or bedside chair)?: A Little Help needed to walk in hospital room?: A Little Help needed climbing 3-5 steps with a railing? : A Lot 6 Click Score: 17    End of Session Equipment Utilized During Treatment: Gait belt Activity Tolerance: Patient tolerated treatment well Patient left: in chair;with call bell/phone within reach;with chair alarm set Nurse Communication: Mobility status PT Visit Diagnosis: Unsteadiness on feet (R26.81);History of falling (Z91.81);Difficulty in walking, not elsewhere classified (R26.2);Pain Pain - Right/Left: Left Pain - part of body: Leg     Time: 8416-6063 PT Time Calculation (min) (ACUTE ONLY): 16 min  Charges:  $Gait Training: 8-22 mins                     Lorrin Goodell, PT  Office # [phone] Pager [phone]    Lorriane Shire 07/26/2018, 9:24 AM

## 2018-07-26 NOTE — Discharge Summary (Addendum)
Physician Discharge Summary  Carl [name] AOZ:[id number] DOB: 1950-11-17 DOA: 07/21/2018  PCP: [name], MD  Admit date: 07/21/2018 Discharge date: 07/27/2018  Time spent: 35 minutes  Recommendations for Outpatient Follow-up:  1. PCP in 1 week 2. FU with Dr. Mardelle Matte in 2 weeks orthopedics 3. Continue subcutaneous Lovenox for 4 weeks for DVT prophylaxis 4. Please resume Lasix and potassium at follow-up as blood pressure tolerates   Discharge Diagnoses:  Principal Problem:   Closed left hip fracture ([place]) Active Problems:   S/P CABG x 5   Hyperlipidemia   Essential hypertension   CAD (coronary artery disease)   Chronic systolic CHF (congestive heart failure) ([facility])   GERD (gastroesophageal reflux disease)   Depression   CKD (chronic kidney disease), stage III ([facility])   Iron deficiency anemia   Hypokalemia   Discharge Condition: Stable  Diet recommendation: Low-sodium, heart healthy  Filed Weights   07/21/18 1808  Weight: 79.4 kg    History of present illness:  Carl [name] a 66 y.o.malewith medical history significant ofhypertension, hyperlipidemia, GERD, depression, anemia, CHF with EF 25%, AICD,CAD, CABG, psoriasis, peptic ulcer disease, vitiligo, CKD 3, who presented with left hip pain following a mechanical fall several days back.  Went orthopedic MD office was found to have a left hip fracture, sent to the emergency room.  Hospital Course:   Closed left hip fracture ([place]) -Status post left hip pinning 11/28 -Continue Lovenox for DVT prophylaxis for 4 more weeks -PT evaluation completed, SNF recommended -Postop course was stable, will be discharged to SNF for rehabilitation  Chronic systolic CHF with EF of 96% -Clinically appears euvolemic -BP is soft in 90-100 range -Continue with low-dose Coreg, recommend restarting low-dose Lasix with potassium within 1 week -Losartan discontinued due to hypotension  History of CAD, CABG, AICD -Last  Myoview in 2018 without inducible ischemia -Stable  Chronic kidney disease stage III -Creatinine stable, hold diuretic and ARB at this time due to soft blood pressures  Hypertension -Systolic blood pressure in the 90-100 range throughout this hospital stay, asymptomatic -hemoglobin is relatively stable -Cozaar discontinued, continue low-dose Coreg due to severe cardiomyopathy and NSVT -Diuretics held, clinically euvolemic, will need these resumed soon down the road  Iron deficiency anemia -Hb stable at 11  Depression -Continue Celexa  when bed available  Consultants:   Orthopedics Dr. Mardelle Matte   Procedures: Left hip pinning 11/28  Discharge Exam: Vitals:   07/25/18 1945 07/26/18 0600  BP: 98/70 102/73  Pulse: 80 76  Resp:    Temp: 98.1 F (36.7 C) 98.5 F (36.9 C)  SpO2: 97% 96%    General: AAOx3, mild cognitive deficits noted Cardiovascular:S1S 2/regular rate rhythm Respiratory: Decreased breath sounds at both bases  Discharge Instructions   Discharge Instructions    Diet - low sodium heart healthy   Complete by:  As directed    Increase activity slowly   Complete by:  As directed      Allergies as of 07/26/2018      Reactions   Spironolactone Other (See Comments)   gynecomastia       Medication List    STOP taking these medications   aspirin 81 MG EC tablet   eplerenone 25 MG tablet Commonly known as:  INSPRA   furosemide 80 MG tablet Commonly known as:  LASIX   ibuprofen 600 MG tablet Commonly known as:  ADVIL,MOTRIN   KLOR-CON M20 20 MEQ tablet Generic drug:  potassium chloride SA   losartan 25 MG  tablet Commonly known as:  COZAAR   metolazone 2.5 MG tablet Commonly known as:  ZAROXOLYN   potassium chloride SA 20 MEQ tablet Commonly known as:  K-DUR,KLOR-CON     TAKE these medications   acetaminophen 325 MG tablet Commonly known as:  TYLENOL Take 325 mg by mouth every 6 (six) hours as needed for mild pain or moderate  pain.   atorvastatin 40 MG tablet Commonly known as:  LIPITOR Take 1 tablet (40 mg total) by mouth daily at 6 PM.   baclofen 10 MG tablet Commonly known as:  LIORESAL Take 1 tablet (10 mg total) by mouth 3 (three) times daily. As needed for muscle spasm   carvedilol 3.125 MG tablet Commonly known as:  COREG Take 3.125 mg by mouth 2 (two) times daily with a meal.   citalopram 20 MG tablet Commonly known as:  CELEXA Take 20 mg by mouth daily.   enoxaparin 30 MG/0.3ML injection Commonly known as:  LOVENOX Inject 0.3 mLs (30 mg total) into the skin daily.   ferrous sulfate 325 (65 FE) MG tablet Take 1 tablet (325 mg total) by mouth daily with breakfast.   HYDROcodone-acetaminophen 5-325 MG tablet Commonly known as:  NORCO/VICODIN Take 1-2 tablets by mouth every 6 (six) hours as needed for moderate pain. MAXIMUM TOTAL ACETAMINOPHEN DOSE IS 4000 MG PER DAY   omeprazole 20 MG capsule Commonly known as:  PRILOSEC Take 20 mg by mouth 2 (two) times daily.   sennosides-docusate sodium 8.6-50 MG tablet Commonly known as:  SENOKOT-S Take 2 tablets by mouth daily.   vitamin B-12 500 MCG tablet Commonly known as:  CYANOCOBALAMIN Take 500 mcg by mouth daily.      Allergies  Allergen Reactions  . Spironolactone Other (See Comments)    gynecomastia     Contact information for follow-up providers    [name], MD. Schedule an appointment as soon as possible for a visit in 2 weeks.   Specialty:  Orthopedic Surgery Contact information: [place] [address] [phone]            Contact information for after-discharge care    [facility] .   Service:  Skilled Nursing Contact information: [place] [phone]                   The results of significant diagnostics from this hospitalization (including imaging, microbiology, ancillary and  laboratory) are listed below for reference.    Significant Diagnostic Studies: Pelvis Portable  Result Date: 07/22/2018 CLINICAL DATA:  Status post left hip pinning EXAM: PORTABLE PELVIS 1-2 VIEWS COMPARISON:  Intraoperative films from earlier in the same day. FINDINGS: There are changes consistent with a fracture through the femoral neck with 3 fixation pins identified. No new focal abnormality is noted. Some deformity in the mid femoral shaft is seen consistent with prior healed fracture. IMPRESSION: Status post left hip pinning Electronically Signed   By: Inez Catalina M.D.   On: 07/22/2018 10:15   Dg C-arm 1-60 Min  Result Date: 07/22/2018 CLINICAL DATA:  Left hip pinning EXAM: OPERATIVE LEFT HIP WITH PELVIS; DG C-ARM 61-120 MIN COMPARISON:  None. FLUOROSCOPY TIME:  Radiation Exposure Index (as provided by the fluoroscopic device): Unavailable If the device does not provide the exposure index: Fluoroscopy Time:  2 minutes 5 seconds Number of Acquired Images:  2 FINDINGS: Three fixation screws are noted traversing the  femoral neck into the femoral head. Increased sclerosis along the junction of the femoral neck and femoral head is noted. IMPRESSION: ORIF of proximal left femur. Electronically Signed   By: Inez Catalina M.D.   On: 07/22/2018 10:04   Dg Hip Operative Unilat W Or W/o Pelvis Left  Result Date: 07/22/2018 CLINICAL DATA:  Left hip pinning EXAM: OPERATIVE LEFT HIP WITH PELVIS; DG C-ARM 61-120 MIN COMPARISON:  None. FLUOROSCOPY TIME:  Radiation Exposure Index (as provided by the fluoroscopic device): Unavailable If the device does not provide the exposure index: Fluoroscopy Time:  2 minutes 5 seconds Number of Acquired Images:  2 FINDINGS: Three fixation screws are noted traversing the femoral neck into the femoral head. Increased sclerosis along the junction of the femoral neck and femoral head is noted. IMPRESSION: ORIF of proximal left femur. Electronically Signed   By: Inez Catalina M.D.    On: 07/22/2018 10:04    Microbiology: Recent Results (from the past 240 hour(s))  Surgical pcr screen     Status: None   Collection Time: 07/21/18 10:56 PM  Result Value Ref Range Status   MRSA, PCR NEGATIVE NEGATIVE Final   Staphylococcus aureus NEGATIVE NEGATIVE Final    Comment: (NOTE) The Xpert SA Assay (FDA approved for NASAL specimens in patients 18 years of age and older), is one component of a comprehensive surveillance program. It is not intended to diagnose infection nor to guide or monitor treatment. Performed at [facility], [place] [address] [place], [place] [address]      Labs: Basic Metabolic Panel: Recent Labs  Lab 07/21/18 1831 07/21/18 1917 07/22/18 0123 07/23/18 0107 07/24/18 0424 07/25/18 0657  NA 137 136  --  134* 134* 137  K 3.2* 3.2*  --  3.4* 3.8 3.5  CL 101 99  --  99 95* 97*  CO2 25  --   --  25 28 27   GLUCOSE 156* 155*  --  126* 106* 114*  BUN 42* 44*  --  37* 38* 29*  CREATININE 1.53* 1.60*  --  1.60* 1.65* 1.28*  CALCIUM 9.2  --   --  8.9 9.1 9.2  MG  --   --  1.8  --   --   --    Liver Function Tests: No results for input(s): AST, ALT, ALKPHOS, BILITOT, PROT, ALBUMIN in the last 168 hours. No results for input(s): LIPASE, AMYLASE in the last 168 hours. No results for input(s): AMMONIA in the last 168 hours. CBC: Recent Labs  Lab 07/21/18 1831 07/21/18 1917 07/23/18 0107 07/24/18 0424 07/25/18 0657  WBC 5.6  --  11.2* 8.9 6.7  NEUTROABS 4.2  --   --   --   --   HGB 12.9* 13.6 11.4* 11.2* 11.0*  HCT 39.3 40.0 35.1* 35.3* 34.0*  MCV 99.7  --  98.6 100.0 99.7  PLT 157  --  160 163 152   Cardiac Enzymes: No results for input(s): CKTOTAL, CKMB, CKMBINDEX, TROPONINI in the last 168 hours. BNP: BNP (last 3 results) Recent Labs    04/13/18 1116 05/04/18 1135 07/21/18 2145  BNP 681.4* 702.8* 703.1*    ProBNP (last 3 results) No results for input(s): PROBNP in the last 8760 hours.  CBG: No results for input(s):  GLUCAP in the last 168 hours.     Signed:  Domenic Polite MD.  [facility] 07/26/2018, 10:32 AM

## 2018-07-27 NOTE — Plan of Care (Signed)
  Problem: Clinical Measurements: Goal: Ability to maintain clinical measurements within normal limits will improve Outcome: Progressing Goal: Will remain free from infection Outcome: Progressing Goal: Respiratory complications will improve Outcome: Progressing   Problem: Activity: Goal: Risk for activity intolerance will decrease Outcome: Progressing   Problem: Nutrition: Goal: Adequate nutrition will be maintained Outcome: Progressing   Problem: Elimination: Goal: Will not experience complications related to bowel motility Outcome: Progressing   Problem: Pain Managment: Goal: General experience of comfort will improve Outcome: Progressing

## 2018-07-27 NOTE — Progress Notes (Signed)
PASRR Number received:  [id number] [place], [place]

## 2018-07-27 NOTE — Care Management Important Message (Signed)
Important Message  Patient Details  Name: Carl [name] MRN: [id number] Date of Birth: Apr 27, 1951   Medicare Important Message Given:  Yes    Marietta Sikkema 07/27/2018, 9:04 AM

## 2018-07-27 NOTE — Clinical Social Work Placement (Signed)
   CLINICAL SOCIAL WORK PLACEMENT  NOTE  Date:  07/27/2018  Patient Details  Name: Carl [name] MRN: [id number] Date of Birth: 10-01-1950  Clinical Social Work is seeking post-discharge placement for this patient at the [place] level of care (*CSW will initial, date and re-position this form in  chart as items are completed):      Patient/family provided with [facility] Work Department's list of facilities offering this level of care within the geographic area requested by the patient (or if unable, by the patient's family).  Yes   Patient/family informed of their freedom to choose among providers that offer the needed level of care, that participate in Medicare, Medicaid or managed care program needed by the patient, have an available bed and are willing to accept the patient.      Patient/family informed of [facility]'s ownership interest in [facility] and [facility], as well as of the fact that they are under no obligation to receive care at these facilities.  PASRR submitted to EDS on       PASRR number received on 07/27/19     Existing PASRR number confirmed on       FL2 transmitted to all facilities in geographic area requested by pt/family on 07/23/18     FL2 transmitted to all facilities within larger geographic area on       Patient informed that his/her managed care company has contracts with or will negotiate with certain facilities, including the following:        Yes   Patient/family informed of bed offers received.  Patient chooses bed at [facility]     Physician recommends and patient chooses bed at      Patient to be transferred to [facility] on 07/27/18.  Patient to be transferred to facility by PTAR     Patient family notified on 07/27/18 of transfer.  Name of family member notified:  [name] (brother)     PHYSICIAN       Additional Comment:     _______________________________________________ Alberteen Sam, LCSW 07/27/2018, 9:49 AM

## 2018-07-27 NOTE — Progress Notes (Signed)
Pt's peer specialist Mr. Donzetta Matters came by to see pt and asked questions regarding pt's care. Pt verbally stated that it was ok to give information and that he helps him schedule his appointments.

## 2018-07-27 NOTE — Progress Notes (Signed)
Called report to Airline pilot at [facility]. Waiting on PTAR to transfer pt to [facility].

## 2018-07-27 NOTE — Progress Notes (Signed)
Patient will DC to: [place] Anticipated DC date: 07/27/18 Family notified: [name] Transport by: Corey Harold  Per MD patient ready for DC to [facility]. RN, patient, patient's family, and facility notified of DC. Discharge Summary sent to facility. RN given number for report [phone] Room 410. DC packet on chart. Ambulance transport requested for patient.  CSW signing off.  [place], [place]

## 2018-07-28 DIAGNOSIS — M8000XD Age-related osteoporosis with current pathological fracture, unspecified site, subsequent encounter for fracture with routine healing: Secondary | ICD-10-CM | POA: Insufficient documentation

## 2018-08-03 ENCOUNTER — Other Ambulatory Visit (HOSPITAL_COMMUNITY): Payer: Self-pay | Admitting: Adult Health

## 2018-08-12 ENCOUNTER — Telehealth: Payer: Self-pay

## 2018-08-12 NOTE — Telephone Encounter (Signed)
Confirmed remote transmission w/ pt bother.  The pt is at the [facility]. He has a cracked hip. The brother going to take his monitor and get the transmission from the patient.

## 2018-08-16 ENCOUNTER — Other Ambulatory Visit (HOSPITAL_COMMUNITY): Payer: Self-pay

## 2018-08-16 NOTE — Progress Notes (Signed)
Visited with Carl [name] today at [facility] to check on him.  He has had surgery for fractured hip and was in rehabilitation.  He states doing well, non weight bearing on left leg, he states going home tomorrow.  He has concerns of going home and getting around.  He has high sodium snacks in room.  He stated been eating good. Will continue to visit [name] at his home next week.   [place] [phone]

## 2018-08-17 NOTE — Progress Notes (Signed)
No ICM remote transmission received for 08/12/2018 and next ICM transmission scheduled for 09/20/2018.

## 2018-08-26 ENCOUNTER — Encounter (HOSPITAL_COMMUNITY): Payer: Self-pay

## 2018-08-26 ENCOUNTER — Telehealth (HOSPITAL_COMMUNITY): Payer: Self-pay

## 2018-08-26 ENCOUNTER — Ambulatory Visit (HOSPITAL_COMMUNITY)
Admission: RE | Admit: 2018-08-26 | Discharge: 2018-08-26 | Disposition: A | Discharge status: Home / Self Care | Payer: Medicare Other | Source: Ambulatory Visit | Attending: Cardiology | Admitting: Cardiology

## 2018-08-26 ENCOUNTER — Encounter (HOSPITAL_COMMUNITY): Payer: Self-pay | Admitting: Cardiology

## 2018-08-26 VITALS — BP 92/58 | HR 72 | Wt 159.4 lb

## 2018-08-26 DIAGNOSIS — N183 Chronic kidney disease, stage 3 (moderate): Secondary | ICD-10-CM | POA: Diagnosis not present

## 2018-08-26 DIAGNOSIS — I255 Ischemic cardiomyopathy: Secondary | ICD-10-CM | POA: Diagnosis not present

## 2018-08-26 DIAGNOSIS — N182 Chronic kidney disease, stage 2 (mild): Secondary | ICD-10-CM | POA: Diagnosis not present

## 2018-08-26 DIAGNOSIS — I2510 Atherosclerotic heart disease of native coronary artery without angina pectoris: Secondary | ICD-10-CM | POA: Insufficient documentation

## 2018-08-26 DIAGNOSIS — Z8249 Family history of ischemic heart disease and other diseases of the circulatory system: Secondary | ICD-10-CM | POA: Diagnosis not present

## 2018-08-26 DIAGNOSIS — Z951 Presence of aortocoronary bypass graft: Secondary | ICD-10-CM | POA: Diagnosis not present

## 2018-08-26 DIAGNOSIS — F329 Major depressive disorder, single episode, unspecified: Secondary | ICD-10-CM | POA: Diagnosis not present

## 2018-08-26 DIAGNOSIS — F70 Mild intellectual disabilities: Secondary | ICD-10-CM | POA: Diagnosis not present

## 2018-08-26 DIAGNOSIS — Z79899 Other long term (current) drug therapy: Secondary | ICD-10-CM | POA: Insufficient documentation

## 2018-08-26 DIAGNOSIS — I340 Nonrheumatic mitral (valve) insufficiency: Secondary | ICD-10-CM | POA: Insufficient documentation

## 2018-08-26 DIAGNOSIS — Z8719 Personal history of other diseases of the digestive system: Secondary | ICD-10-CM | POA: Diagnosis not present

## 2018-08-26 DIAGNOSIS — Z87891 Personal history of nicotine dependence: Secondary | ICD-10-CM | POA: Diagnosis not present

## 2018-08-26 DIAGNOSIS — I5022 Chronic systolic (congestive) heart failure: Secondary | ICD-10-CM | POA: Diagnosis present

## 2018-08-26 LAB — CBC
HCT: 43.5 % (ref 39.0–52.0)
Hemoglobin: 14.4 g/dL (ref 13.0–17.0)
MCH: 33.8 pg (ref 26.0–34.0)
MCHC: 33.1 g/dL (ref 30.0–36.0)
MCV: 102.1 fL — ABNORMAL HIGH (ref 80.0–100.0)
Platelets: 191 10*3/uL (ref 150–400)
RBC: 4.26 MIL/uL (ref 4.22–5.81)
RDW: 14.1 % (ref 11.5–15.5)
WBC: 5.5 10*3/uL (ref 4.0–10.5)
nRBC: 0 % (ref 0.0–0.2)

## 2018-08-26 LAB — CUP PACEART REMOTE DEVICE CHECK
Battery Remaining Longevity: 116 mo
Battery Voltage: 3.02 V
Date Time Interrogation Session: 20191107083324
HIGH POWER IMPEDANCE MEASURED VALUE: 65 Ohm
Implantable Lead Implant Date: 20160906
Implantable Lead Location: 753860
Implantable Pulse Generator Implant Date: 20160906
Lead Channel Impedance Value: 323 Ohm
Lead Channel Impedance Value: 399 Ohm
Lead Channel Pacing Threshold Amplitude: 0.75 V
Lead Channel Pacing Threshold Pulse Width: 0.4 ms
Lead Channel Sensing Intrinsic Amplitude: 11.875 mV
Lead Channel Sensing Intrinsic Amplitude: 11.875 mV
Lead Channel Setting Pacing Amplitude: 2.5 V
Lead Channel Setting Pacing Pulse Width: 0.4 ms
Lead Channel Setting Sensing Sensitivity: 0.3 mV
MDC IDC STAT BRADY RV PERCENT PACED: 0.09 %

## 2018-08-26 LAB — BASIC METABOLIC PANEL
Anion gap: 12 (ref 5–15)
BUN: 20 mg/dL (ref 8–23)
CO2: 27 mmol/L (ref 22–32)
Calcium: 9.5 mg/dL (ref 8.9–10.3)
Chloride: 97 mmol/L — ABNORMAL LOW (ref 98–111)
Creatinine, Ser: 1.64 mg/dL — ABNORMAL HIGH (ref 0.61–1.24)
GFR calc Af Amer: 49 mL/min — ABNORMAL LOW (ref >60)
GFR calc non Af Amer: 43 mL/min — ABNORMAL LOW (ref >60)
Glucose, Bld: 117 mg/dL — ABNORMAL HIGH (ref 70–99)
Potassium: 3.5 mmol/L (ref 3.5–5.1)
Sodium: 136 mmol/L (ref 135–145)

## 2018-08-26 MED ORDER — ASPIRIN EC 81 MG PO TBEC: 81.0000 mg | DELAYED_RELEASE_TABLET | Freq: Every day | ORAL | 3 refills | Status: DC

## 2018-08-26 MED ORDER — EPLERENONE 25 MG PO TABS: 25.0000 mg | ORAL_TABLET | Freq: Every day | ORAL | 6 refills | Status: DC

## 2018-08-26 NOTE — Telephone Encounter (Signed)
Spoke with Levonne Spiller brother and he is concerned about his medications.  [place] was just discharged from SNF from hip surgery and since his med list shows no aspirin,  fluid pills and no potassium.  He has appt today with cardiology and spoke with Levada Dy about this concern at HF clinic in [place].  She states she will advise the nurse.  Will start back visiting with [name] next week.   [place] EMT-Parmedic [phone]

## 2018-08-26 NOTE — Progress Notes (Signed)
Patient ID: Carl [name], male   DOB: 04/28/1951, 68 y.o.   MRN: [id number] PCP: Dr. Ginette Pitman Cardiology: Dr. [name]  68 y.o. with history of smoking and mild mental retardation was admitted to [facility] in 12/15 with dyspnea.  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  LHC showed 3 vessel disease with EF 15%.  Echo showed EF 15-20%.  Patient had CABG x 5.  It was difficult to wean him off pressors post-op.  He ended up having to start midodrine but was later weaned off.   Admitted 10/17 with volume overload and AKI. Diuresed with IV lasix and transitiioned to 40 mg lasix daily. Spiro, dig, lisinopril stopped due to elevated creatinine 2.28. Discharge weight 163 pounds.    Echo in 2/18 showed EF 30-35%, mild LV dilation, rWMAs, moderate MR, mild to moderately decreased RV systolic function. Cardiolite in 2/18 showed large inferoseptal/inferior/inferolateral infarction with no ischemia.   Admitted to [facility] 06/10/17-06/12/17 with acute on chronic systolic CHF. Diuresed 5 pounds with IV Lasix. Discharge weight was 175 pounds.  Echo in 10/18 showed EF 20-25% with severe MR, possibly ischemic MR.   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 He was seen by Dr. Burt Knack for Mitraclip consideration, waiting for CMS approval for Mitraclip in functional MR patients.   In 11/19, he had a mechanical fall (tripped, no syncope) and fractured his left hip.  His hip was pinned.   He returns today for followup of CHF.  He is now home from the rehab facility he went into post-hip surgery.  He is off losartan and eplerenone apparently due to soft BP.  He is limited by ongoing hip pain and uses a walker.  No exertional dyspnea or chest pain.  No orthopnea/PND.  He is still walking with a walker.    Medtronic device interrogation: Possible episodes of atrial fibrillation in 11/19.  Thoracic impedance stable currently.   Labs (12/15): K 4.1, creatinine  0.81, hgb 9.1 Labs (09/12/2014): K 3.7 Creatinine 0.96, digoxin 0.7 Labs (3/16): digoxin 0.8, LDL 62, HDL 40 Labs (4/16): K 4.3, creatinine 1.1 Labs (5/16) K 4.6, creatinine 1.24, digoxin 1.0 Labs (05/2016): K 3.6 Creatinine 1.47  => 1.37 Labs (11/17): LDL 61, HDL 41, K 4.2, creaitnine 1.1 Labs (2/18): K 3.7 => 5.3, creatinine 1.84 => 1.35, BNP 924 Labs (3/18): K 4.8, creatinine 1.33, BNP 552 Labs (9/18): K 3.9, creatinine 1.97 Labs (08/03/2018): K 4.2 Creatinine 1.6 Labs ( 09/09/2017): K 4.4 Creatinine 1.53 Labs (3/19): K 4.5, creatinine 1.5 Labs (6/19): LDL 71 Labs (12/19): hgb 11, K 3.5, creatinine 1.28  PMH: 1. Smoker 2. Mild mental retardation 3. CAD: LHC (12/15) with 3 vessel disease.  CABG x 5 in 12/15 with LIMA-LAD, SVG-D1, sequential SVG-OM2/OM3, SVG-PDA.   - Cardiolite (2/18):  Large inferoseptal/inferior/inferolateral infarction with no ischemia, EF 29%.  4. Ischemic cardiomyopathy: Echo (12/15) with EF 15-20%, wall motion abnormalities, mildly decreased RV systolic function, mild MR.  Echo (3/16) with EF 30-35%, severe LV dilation, moderate MR, PA systolic pressure 42 mmHg. Echo (8/16) with EF 25-30%, severely dilated LV, diffuse hypokinesis with inferior akinesis, restrictive diastolic function, RV mildly dilated with mildly decreased systolic function, moderate MR.  - Echo (10/17): EF 20-25%, moderate MR.  - Medtronic ICD.  - ACEI cough. Gynecomastia with spironolacon - Echo (2/18): EF 30-35%, mild LV dilation, regional WMAs, moderate diastolic dysfunction, normal RV size with mild to moderately decreased  systolic function, moderate MR (likely infarct-related).  - Echo (10/18): EF 20-25%, severe MR (likely ischemic).  5. Depression 6. Vitiligo 7. Mitral regurgitation: Moderate on 2/18, likely infarct-related.  - Severe on 10/18 echo, likely infarct-related. - TEE (6/19): EF 25-30%, severe ischemic MR with restricted posterior leaflet, mild RV dilation with mild to moderate  systolic dysfunction.  8. CKD stage III.  9. Melena- 08/24/2018 EGD/Colonoscopy polyp and gastritis.  10. Left hip fracture 11/19: s/p surgery.   SH: Lives alone, quit smoking in 12/15.  No ETOH.   FH: Brother and father with MIs.    ROS: All systems reviewed and negative except as per HPI.   Current Outpatient Medications  Medication Sig Dispense Refill  . atorvastatin (LIPITOR) 40 MG tablet Take 1 tablet (40 mg total) by mouth daily at 6 PM. 30 tablet 1  . baclofen (LIORESAL) 10 MG tablet Take 1 tablet (10 mg total) by mouth 3 (three) times daily. As needed for muscle spasm 50 tablet 0  . carvedilol (COREG) 3.125 MG tablet Take 3.125 mg by mouth 2 (two) times daily with a meal.    . citalopram (CELEXA) 20 MG tablet Take 20 mg by mouth daily.   5  . cyanocobalamin 500 MCG tablet Take 500 mcg by mouth daily.    Marland Kitchen enoxaparin (LOVENOX) 30 MG/0.3ML injection Inject 0.3 mLs (30 mg total) into the skin daily. 30 Syringe 0  . ferrous sulfate 325 (65 FE) MG tablet Take 1 tablet (325 mg total) by mouth daily with breakfast. 30 tablet 3  . furosemide (LASIX) 80 MG tablet Take 80 mg by mouth daily.    Marland Kitchen HYDROcodone-acetaminophen (NORCO) 5-325 MG tablet Take 1-2 tablets by mouth every 6 (six) hours as needed for moderate pain. MAXIMUM TOTAL ACETAMINOPHEN DOSE IS 4000 MG PER DAY 30 tablet 0  . omeprazole (PRILOSEC) 20 MG capsule Take 20 mg by mouth 2 (two) times daily.    Marland Kitchen POTASSIUM CHLORIDE ER PO Take 20 mEq by mouth daily.    . sennosides-docusate sodium (SENOKOT-S) 8.6-50 MG tablet Take 2 tablets by mouth daily. 30 tablet 1  . acetaminophen (TYLENOL) 325 MG tablet Take 325 mg by mouth every 6 (six) hours as needed for mild pain or moderate pain.     Marland Kitchen aspirin EC 81 MG tablet Take 1 tablet (81 mg total) by mouth daily. 90 tablet 3  . eplerenone (INSPRA) 25 MG tablet Take 1 tablet (25 mg total) by mouth daily. 30 tablet 6   No current facility-administered medications for this encounter.    BP  (!) 92/58   Pulse 72   Wt 72.3 kg (159 lb 6.4 oz)   SpO2 97%   BMI 21.62 kg/m   Filed Weights   08/26/18 1012  Weight: 72.3 kg (159 lb 6.4 oz)    General: NAD Neck: No JVD, no thyromegaly or thyroid nodule.  Lungs: Clear to auscultation bilaterally with normal respiratory effort. CV: Nondisplaced PMI.  Heart regular S1/S2, no S3/S4, 3/6 HSM apex.  No peripheral edema.  No carotid bruit.  Normal pedal pulses.  Abdomen: Soft, nontender, no hepatosplenomegaly, no distention.  Skin: Intact without lesions or rashes.  Neurologic: Alert and oriented x 3.  Psych: Normal affect. Extremities: No clubbing or cyanosis.  HEENT: Normal.   Assessment/Plan: 1. CAD: status post CABG.  Cardiolite in 2/18 showed infarction, no ischemia.  No chest pain.  - Continue statin. Good lipids in 6/19.  - Restart ASA 81 daily, this was stopped  at time of hip fracture.  2. Chronic systolic CHF: Ischemic CMP.  Echo (10/18) with EF 20-25%.  Has Medtronic ICD.  He is not volume overloaded on exam or by Optivol.  Weight is down.  NYHA class II. SBP 90s today. .  - Continue Lasix 80 mg daily.  BMET today.  - Continue Coreg 3.125 mg bid. - Restart eplerenone 25 mg daily. BMET 10 days.  - If BP remains stable, restart losartan at followup.   3. Depression: Continue Celexa 4. Mitral regurgitation: Severe, probably infarct-related.  TEE in 6/19 confirmed severe ischemic MR.   He has seen Dr Burt Knack, should be a reasonable Mitraclip candidate when CMS approves coverage for functional MR.   5. CKD Stage III: Creatinine baseline 1.3-1.5. BMET today and again in 10 days.  6. H/O GI bleed: EGD/Colonoscopy with polyp noted and gastritis. No overt bleeding currently.   Followup with NP/PA in 3 wks.   [name] 08/26/2018

## 2018-08-26 NOTE — Progress Notes (Signed)
note

## 2018-08-26 NOTE — Patient Instructions (Addendum)
START Aspirin 81mg  daily  START Eplerenone 25mg  daily  Repeat labs in 10 days at [facility] in [place] recommends that you schedule a follow-up appointment in: 3 weeks in [facility]

## 2018-08-30 ENCOUNTER — Other Ambulatory Visit (HOSPITAL_COMMUNITY): Payer: Self-pay

## 2018-08-30 ENCOUNTER — Encounter (HOSPITAL_COMMUNITY): Payer: Self-pay

## 2018-08-30 NOTE — Progress Notes (Signed)
Bp: 98/62, pulse: 65, resp: 16, spo2: 97  Not weighing at the time due to unable to weight bearing on left leg due to hip surgery.  Carl [name] back at home, he states been doing pretty good, getting around by walker, he is no weight bearing on left leg. He staets appetite been good.  He staes been trying to watch sodium, but was hard in [facility].  Today he staes ate boiled egg and eating speghetti for supper.  Will continue to work with him and family about low sodium meals.  At this time his family is preparing his meals or bringing him food.  He states they brough him a plate of pork chops last night.  Meds verified and med box refilled. Carl [name] is also taking Vit D3 2,000 units daily. Lungs are clear, some swelling in extremities, has sock lines. Denies chest pain, shortness of breath, dizziness or headaches.  He complains his knees hurting, he states physical therapy should be coming to visit. Abdomen does not feel tight. Will continue to visit weekly for heart failure.   [place] [phone]

## 2018-08-31 ENCOUNTER — Other Ambulatory Visit: Payer: Self-pay | Admitting: Internal Medicine

## 2018-09-02 ENCOUNTER — Encounter (HOSPITAL_COMMUNITY): Payer: Self-pay

## 2018-09-02 ENCOUNTER — Other Ambulatory Visit (HOSPITAL_COMMUNITY): Payer: Self-pay

## 2018-09-02 NOTE — Progress Notes (Signed)
Electrophysiology Office Note Date: 09/03/2018  ID:  Carl [name], DOB 24-May-1951, MRN [id number]  PCP: [name], MD Primary Cardiologist: [name] Electrophysiologist: [place]  CC: Routine ICD follow-up  Carl [name] is a 68 y.o. male seen today for [name].  He has not been seen in clinic by EP since 2017.  He presents today for routine electrophysiology followup.  Since last being seen in our clinic, the patient reports doing reasonably well.  He fell in the fall and broke his hip requiring surgery. Since then, his activity has been limited.  He denies chest pain, palpitations, dyspnea (above baseline), PND, orthopnea, nausea, vomiting, dizziness, syncope, edema (above baseline), weight gain, or early satiety.  He has not had ICD shocks. HF meds limited by hypotension  Device History: MDT single chamber ICD implanted 2016 for ICM History of appropriate therapy: No History of AAD therapy: No   Past Medical History:  Diagnosis Date  . Anemia   . Anxiety   . Arthritis   . Brunner's gland hyperplasia of duodenum   . CHF (congestive heart failure) ([place])   . Coronary artery disease   . External hemorrhoids   . Fracture of femoral neck, left ([place]) 07/21/2018  . GERD (gastroesophageal reflux disease)   . Hearing loss   . HH (hiatus hernia)   . Internal hemorrhoids   . Ischemic cardiomyopathy   . Leg fracture, left   . Myocardial infarction ([place])   . Paronychia   . Psoriasis   . PUD (peptic ulcer disease)   . Schatzki's ring   . Tubular adenoma of colon   . Vitiligo    Past Surgical History:  Procedure Laterality Date  . CARDIAC SURGERY    . COLONOSCOPY WITH ESOPHAGOGASTRODUODENOSCOPY (EGD)    . COLONOSCOPY WITH PROPOFOL N/A 08/24/2017   Procedure: COLONOSCOPY WITH PROPOFOL;  Surgeon: [name], MD;  Location: [name] ENDOSCOPY;  Service: Endoscopy;  Laterality: N/A;  . CORONARY ARTERY BYPASS GRAFT N/A 08/03/2014   Procedure: CORONARY ARTERY BYPASS  GRAFTING (CABG);  Surgeon: [name], MD;  Location: [facility];  Service: Open Heart Surgery;  Laterality: N/A;  . EP IMPLANTABLE DEVICE N/A 05/01/2015   MDT ICD implanted for primary prevention of sudden death  . ESOPHAGOGASTRODUODENOSCOPY (EGD) WITH PROPOFOL N/A 04/16/2015   Procedure: ESOPHAGOGASTRODUODENOSCOPY (EGD) WITH PROPOFOL;  Surgeon: [name], MD;  Location[facility] ENDOSCOPY;  Service: Endoscopy;  Laterality: N/A;  . ESOPHAGOGASTRODUODENOSCOPY (EGD) WITH PROPOFOL N/A 08/24/2017   Procedure: ESOPHAGOGASTRODUODENOSCOPY (EGD) WITH PROPOFOL;  Surgeon: [name], MD;  Location: [facility] ENDOSCOPY;  Service: Endoscopy;  Laterality: N/A;  . FRACTURE SURGERY    . HERNIA REPAIR    . HIP PINNING,CANNULATED Left 07/22/2018   Procedure: CANNULATED HIP PINNING;  Surgeon: [name], MD;  Location: [place];  Service: Orthopedics;  Laterality: Left;  . TEE WITHOUT CARDIOVERSION N/A 08/03/2014   Procedure: TRANSESOPHAGEAL ECHOCARDIOGRAM (TEE);  Surgeon: [name], MD;  Location: [place];  Service: Open Heart Surgery;  Laterality: N/A;  . TEE WITHOUT CARDIOVERSION N/A 02/10/2018   Procedure: TRANSESOPHAGEAL ECHOCARDIOGRAM (TEE);  Surgeon: [name], MD;  Location: [facility] ENDOSCOPY;  Service: Cardiovascular;  Laterality: N/A;    Current Outpatient Medications  Medication Sig Dispense Refill  . acetaminophen (TYLENOL) 325 MG tablet Take 325 mg by mouth every 6 (six) hours as needed for mild pain or moderate pain.     Marland Kitchen aspirin EC 81 MG tablet Take 1 tablet (81 mg total) by  mouth daily. 90 tablet 3  . atorvastatin (LIPITOR) 40 MG tablet Take 1 tablet (40 mg total) by mouth daily at 6 PM. 30 tablet 1  . baclofen (LIORESAL) 10 MG tablet Take 1 tablet (10 mg total) by mouth 3 (three) times daily. As needed for muscle spasm 50 tablet 0  . carvedilol (COREG) 3.125 MG tablet Take 3.125 mg by mouth 2 (two) times daily with a meal.    . citalopram (CELEXA) 20 MG tablet Take 20 mg by mouth  daily.   5  . cyanocobalamin 500 MCG tablet Take 500 mcg by mouth daily.    Marland Kitchen enoxaparin (LOVENOX) 30 MG/0.3ML injection Inject 0.3 mLs (30 mg total) into the skin daily. 30 Syringe 0  . eplerenone (INSPRA) 25 MG tablet Take 1 tablet (25 mg total) by mouth daily. 30 tablet 6  . ferrous sulfate 325 (65 FE) MG tablet Take 1 tablet (325 mg total) by mouth daily with breakfast. 30 tablet 3  . furosemide (LASIX) 80 MG tablet Take 80 mg by mouth daily.    Marland Kitchen HYDROcodone-acetaminophen (NORCO) 5-325 MG tablet Take 1-2 tablets by mouth every 6 (six) hours as needed for moderate pain. MAXIMUM TOTAL ACETAMINOPHEN DOSE IS 4000 MG PER DAY 30 tablet 0  . omeprazole (PRILOSEC) 20 MG capsule Take 20 mg by mouth 2 (two) times daily.    Marland Kitchen POTASSIUM CHLORIDE ER PO Take 20 mEq by mouth daily.    . sennosides-docusate sodium (SENOKOT-S) 8.6-50 MG tablet Take 2 tablets by mouth daily. 30 tablet 1   No current facility-administered medications for this visit.     Allergies:   Spironolactone   Social History: Social History   Socioeconomic History  . Marital status: Single    Spouse name: Not on file  . Number of children: Not on file  . Years of education: Not on file  . Highest education level: Not on file  Occupational History  . Not on file  Social Needs  . Financial resource strain: Not hard at all  . Food insecurity:    Worry: Never true    Inability: Never true  . Transportation needs:    Medical: No    Non-medical: No  Tobacco Use  . Smoking status: Former Smoker    Last attempt to quit: 2014    Years since quitting: 6.0  . Smokeless tobacco: Never Used  . Tobacco comment: 07/30/2017 Quit in 2014  Substance and Sexual Activity  . Alcohol use: No    Alcohol/week: 0.0 standard drinks  . Drug use: No  . Sexual activity: Never  Lifestyle  . Physical activity:    Days per week: Not on file    Minutes per session: Not on file  . Stress: Not on file  Relationships  . Social connections:     Talks on phone: Not on file    Gets together: Not on file    Attends religious service: Not on file    Active member of club or organization: Not on file    Attends meetings of clubs or organizations: Not on file    Relationship status: Not on file  . Intimate partner violence:    Fear of current or ex partner: Not on file    Emotionally abused: Not on file    Physically abused: Not on file    Forced sexual activity: Not on file  Other Topics Concern  . Not on file  Social History Narrative   Pt lives in [place]  with his mother.   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 Valvular heart disease Mother        Ruptured valve  . CAD Father   . Heart Problems Brother        Stents x 4  . Diabetes Brother   . Prostate cancer Neg Hx   . Bladder Cancer Neg Hx   . Kidney cancer Neg Hx     Review of Systems: All other systems reviewed and are otherwise negative except as noted above.   Physical Exam: VS:  BP 94/66   Pulse 69   Ht 6' (1.829 m)   Wt 164 lb (74.4 kg)   BMI 22.24 kg/m  , BMI Body mass index is 22.24 kg/m.  GEN- The patient is chronically ill appearing, alert and oriented x 3 today.   HEENT: normocephalic, atraumatic; sclera clear, conjunctiva pink; hearing intact; oropharynx clear; neck supple  Lungs- Clear to ausculation bilaterally, normal work of breathing.  No wheezes, rales, rhonchi Heart- Regular rate and rhythm, 3/6 SEM GI- soft, non-tender, non-distended, bowel sounds present, no hepatosplenomegaly Extremities- no clubbing, cyanosis, trace BLE edema MS- no significant deformity or atrophy Skin- warm and dry, no rash or lesion; ICD pocket well healed Psych- euthymic mood, full affect Neuro- strength and sensation are intact  ICD interrogation- reviewed in detail today,  See PACEART report  EKG:  EKG is not ordered today.  Recent Labs: 07/21/2018: B Natriuretic Peptide  703.1 07/22/2018: Magnesium 1.8 08/26/2018: BUN 20; Creatinine, Ser 1.64; Hemoglobin 14.4; Platelets 191; Potassium 3.5; Sodium 136   Wt Readings from Last 3 Encounters:  09/03/18 164 lb (74.4 kg)  08/26/18 159 lb 6.4 oz (72.3 kg)  07/21/18 175 lb (79.4 kg)     Other studies Reviewed: Additional studies/ records that were reviewed today include: [name] and AHF notes   Assessment and Plan:  1.  Chronic systolic dysfunction euvolemic today Stable on an appropriate medical regimen Normal ICD function See Pace Art report No changes today  2.  Severe MR felt to be infarct related Plan Mitraclip per Dr Claris Gladden notes when insurance approval available.   3.  ?AF  Seen on single chamber device EGM review shows SR with frequent ectopy Will continue to monitor    Current medicines are reviewed at length with the patient today.   The patient does not have concerns regarding his medicines.  The following changes were made today:  none  Labs/ tests ordered today include: none Orders Placed This Encounter  Procedures  . CUP PACEART INCLINIC DEVICE CHECK     Disposition:   Follow up with [facility], ICM clinic, [name] 1 year     Signed, [name], NP 09/03/2018 11:53 AM  [place] [address] [phone] (office) [phone] (fax)

## 2018-09-02 NOTE — Progress Notes (Signed)
Bp: 98/52, pulse: 60, resp: 18, spo2: 98  Carl [name] today was feeling good except for his knees hurting.  Spoke with his brother Carl [name] and Carl [name] has added to his meds cephalexin x3 daily and hydrocodone 1/2 tablet x2 daily.  Also start on multivit and vit c.  Meds verified and med box refilled.  Explained to Carl [name] when to take his meds, he needs to eat and drink plenty.  Carl [name] report advised Carl [name] was dehydrated.  He states he understands.  Made a List for Carl [name] brother how I set up his med box.  Carl [name] denies headaches, chest pain, dizziness or shortness of breath.  No swelling in extremities and denies abdomen tightness.  Carl [name] is still no weight bearing on left leg, has appt with ortho on Monday.  Will continue to visit weekly for heart failure and med compliant.   Carl [name] [phone]

## 2018-09-03 ENCOUNTER — Ambulatory Visit (INDEPENDENT_AMBULATORY_CARE_PROVIDER_SITE_OTHER): Payer: Medicare Other | Admitting: Nurse Practitioner

## 2018-09-03 VITALS — BP 94/66 | HR 69 | Ht 72.0 in | Wt 164.0 lb

## 2018-09-03 DIAGNOSIS — I340 Nonrheumatic mitral (valve) insufficiency: Secondary | ICD-10-CM

## 2018-09-03 DIAGNOSIS — I5022 Chronic systolic (congestive) heart failure: Secondary | ICD-10-CM

## 2018-09-03 LAB — CUP PACEART INCLINIC DEVICE CHECK
Date Time Interrogation Session: 20200110114715
Implantable Lead Implant Date: 20160906
Implantable Lead Location: 753860
Implantable Pulse Generator Implant Date: 20160906

## 2018-09-03 NOTE — Patient Instructions (Signed)
Medication Instructions:  NONE If you need a refill on your cardiac medications before your next appointment, please call your pharmacy.   Lab work: NONE If you have labs (blood work) drawn today and your tests are completely normal, you will receive your results only by: Marland Kitchen MyChart Message (if you have MyChart) OR . A paper copy in the mail If you have any lab test that is abnormal or we need to change your treatment, we will call you to review the results.  Testing/Procedures: NONE  Follow-Up: At [facility], you and your health needs are our priority.  As part of our continuing mission to provide you with exceptional heart care, we have created designated Provider Care Teams.  These Care Teams include your primary Cardiologist (physician) and Advanced Practice Providers (APPs -  Physician Assistants and Nurse Practitioners) who all work together to provide you with the care you need, when you need it. You will need a follow up appointment in 1 years.  Please call our office 2 months in advance to schedule this appointment.  You may see [name] or one of the following Advanced Practice Providers on your designated Care Team:   [name], NP . Tommye Standard, PA-C  Any Other Special Instructions Will Be Listed Below (If Applicable). Remote monitoring is used to monitor your ICD from home. This monitoring reduces the number of office visits required to check your device to one time per year. It allows [place] to keep an eye on the functioning of your device to ensure it is working properly. You are scheduled for a device check from home on 09/20/2018. You may send your transmission at any time that day. If you have a wireless device, the transmission will be sent automatically. After your physician reviews your transmission, you will receive a postcard with your next transmission date.

## 2018-09-08 ENCOUNTER — Other Ambulatory Visit (HOSPITAL_COMMUNITY): Payer: Self-pay

## 2018-09-08 ENCOUNTER — Encounter (HOSPITAL_COMMUNITY): Payer: Self-pay

## 2018-09-08 NOTE — Progress Notes (Signed)
Bp: 98/62, pulse: 61, resp: 18, spo2: 96  Today Carl [name] is doing much better, he got clearance from his doctor to weight bear on his leg.  He is ambulatory with walker.  He states legs are not hurting as bad, he has therapy coming out now.  He is cooking boiled eggs for breakfast.  He is running late this morning, he has not took his meds yet.  Meds verified and med box refilled.  He denies chest pain, dizziness, headaches or shortness of breath.  He denies abdomen tightness.  Discussed diet, right now his family is bringing his meals, I have discussed low salt diets with them.  Will continue to visit weekly for heart failure and med compliance.   [place] [phone]

## 2018-09-13 ENCOUNTER — Encounter (HOSPITAL_COMMUNITY): Payer: Self-pay

## 2018-09-13 ENCOUNTER — Other Ambulatory Visit (HOSPITAL_COMMUNITY): Payer: Self-pay

## 2018-09-13 NOTE — Progress Notes (Signed)
Bp: 98/60, pulse: 60, resp: 18, spo2: 97  Carl [name] today immediately started telling me that in the bathroom he hurt his leg, he has a pain in thigh area on side.  He states nurse just left but he did not tell her.  He is able to get around on his walker.  Advised his brother by phone of what Arya told me.  Left note for nurse also, she will be back in 2 days.  Keontay states he is ok, just sore.  He states been taking his pain pills.  He states otherwise been doing ok.  Denies chest pain, abdominal tightness.  He states stools are good.  He finished his antibiotics.  He states been eating good, appetite is good.  He tries to watch his sodium but family is bringing his food in.  Meds verified and vit d called in for refills.  Med box refilled.  Advised [name] his brother that Vit d will be ready for pick up.  Lungs are clear, no swelling in right leg, some in left leg.  Denies headaches or dizziness.  Will visit weekly for heart failure,med compliance and diet.   [place] [phone]

## 2018-09-16 ENCOUNTER — Other Ambulatory Visit: Payer: Self-pay

## 2018-09-16 ENCOUNTER — Telehealth (HOSPITAL_COMMUNITY): Payer: Self-pay

## 2018-09-16 ENCOUNTER — Encounter (HOSPITAL_COMMUNITY): Payer: Self-pay

## 2018-09-16 ENCOUNTER — Ambulatory Visit (HOSPITAL_COMMUNITY)
Admission: RE | Admit: 2018-09-16 | Discharge: 2018-09-16 | Disposition: A | Discharge status: Home / Self Care | Payer: Medicare Other | Source: Ambulatory Visit | Attending: Cardiology | Admitting: Cardiology

## 2018-09-16 VITALS — BP 112/84 | HR 68 | Wt 166.4 lb

## 2018-09-16 DIAGNOSIS — I519 Heart disease, unspecified: Secondary | ICD-10-CM

## 2018-09-16 DIAGNOSIS — Z87891 Personal history of nicotine dependence: Secondary | ICD-10-CM | POA: Diagnosis not present

## 2018-09-16 DIAGNOSIS — I2510 Atherosclerotic heart disease of native coronary artery without angina pectoris: Secondary | ICD-10-CM | POA: Insufficient documentation

## 2018-09-16 DIAGNOSIS — Z7982 Long term (current) use of aspirin: Secondary | ICD-10-CM | POA: Diagnosis not present

## 2018-09-16 DIAGNOSIS — N182 Chronic kidney disease, stage 2 (mild): Secondary | ICD-10-CM | POA: Diagnosis not present

## 2018-09-16 DIAGNOSIS — F70 Mild intellectual disabilities: Secondary | ICD-10-CM | POA: Diagnosis not present

## 2018-09-16 DIAGNOSIS — I5022 Chronic systolic (congestive) heart failure: Secondary | ICD-10-CM | POA: Insufficient documentation

## 2018-09-16 DIAGNOSIS — I340 Nonrheumatic mitral (valve) insufficiency: Secondary | ICD-10-CM | POA: Insufficient documentation

## 2018-09-16 DIAGNOSIS — I255 Ischemic cardiomyopathy: Secondary | ICD-10-CM | POA: Insufficient documentation

## 2018-09-16 DIAGNOSIS — Z951 Presence of aortocoronary bypass graft: Secondary | ICD-10-CM | POA: Diagnosis not present

## 2018-09-16 DIAGNOSIS — Z79899 Other long term (current) drug therapy: Secondary | ICD-10-CM | POA: Diagnosis not present

## 2018-09-16 DIAGNOSIS — F329 Major depressive disorder, single episode, unspecified: Secondary | ICD-10-CM | POA: Insufficient documentation

## 2018-09-16 DIAGNOSIS — N183 Chronic kidney disease, stage 3 (moderate): Secondary | ICD-10-CM | POA: Insufficient documentation

## 2018-09-16 LAB — BASIC METABOLIC PANEL
Anion gap: 15 (ref 5–15)
BUN: 17 mg/dL (ref 8–23)
CO2: 25 mmol/L (ref 22–32)
CREATININE: 1.37 mg/dL — AB (ref 0.61–1.24)
Calcium: 9.2 mg/dL (ref 8.9–10.3)
Chloride: 96 mmol/L — ABNORMAL LOW (ref 98–111)
GFR calc Af Amer: >60 mL/min (ref >60)
GFR calc non Af Amer: 53 mL/min — ABNORMAL LOW (ref >60)
Glucose, Bld: 128 mg/dL — ABNORMAL HIGH (ref 70–99)
Potassium: 2.7 mmol/L — CL (ref 3.5–5.1)
Sodium: 136 mmol/L (ref 135–145)

## 2018-09-16 MED ORDER — LOSARTAN POTASSIUM 25 MG PO TABS: 12.5000 mg | ORAL_TABLET | Freq: Every day | ORAL | 6 refills | Status: DC

## 2018-09-16 MED ORDER — FUROSEMIDE 40 MG PO TABS: 40.0000 mg | ORAL_TABLET | Freq: Two times a day (BID) | ORAL | 6 refills | Status: DC

## 2018-09-16 MED ORDER — POTASSIUM CHLORIDE ER 20 MEQ PO TBCR: 40.0000 meq | EXTENDED_RELEASE_TABLET | Freq: Every day | ORAL | 5 refills | Status: DC

## 2018-09-16 NOTE — Telephone Encounter (Signed)
Critical lab report K 2.7   Reviewed with Rebecca Eaton  60 meq now AND 60 meq at bedtime Increase daily dose to 40 meq daily Repeat labs next week   Pt aware via brother and voiced understanding repeat labs 09/24/2018

## 2018-09-16 NOTE — Telephone Encounter (Signed)
Spoke with [name] today about Carl [name]'s change in his medications.  He was able to repeat back to me and will place in med box for him today through Monday.  He repeated to me place losarten 1/2(12.5 MG) tablet in evening, place 1 potassium in morning and 1 in evening(would make 40 meq).  Will make a home visit with [name] on Monday to redo med boxes.  [name] [phone]

## 2018-09-16 NOTE — Progress Notes (Signed)
Patient ID: Carl [name], male   DOB: 05-08-1951, 68 y.o.   MRN: [id number] PCP: Dr. Ginette Pitman Cardiology: Dr. [name]  68 y.o. with history of smoking and mild mental retardation was admitted to [facility] in 12/15 with dyspnea.  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  LHC showed 3 vessel disease with EF 15%.  Echo showed EF 15-20%.  Patient had CABG x 5.  It was difficult to wean him off pressors post-op.  He ended up having to start midodrine but was later weaned off.   Admitted 10/17 with volume overload and AKI. Diuresed with IV lasix and transitiioned to 40 mg lasix daily. Spiro, dig, lisinopril stopped due to elevated creatinine 2.28. Discharge weight 163 pounds.    Echo in 2/18 showed EF 30-35%, mild LV dilation, rWMAs, moderate MR, mild to moderately decreased RV systolic function. Cardiolite in 2/18 showed large inferoseptal/inferior/inferolateral infarction with no ischemia.   Admitted to [facility] 06/10/17-06/12/17 with acute on chronic systolic CHF. Diuresed 5 pounds with IV Lasix. Discharge weight was 175 pounds.  Echo in 10/18 showed EF 20-25% with severe MR, possibly ischemic MR.   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 He was seen by Dr. Burt Knack for Mitraclip consideration, waiting for CMS approval for Mitraclip in functional MR patients.   In 11/19, he had a mechanical fall (tripped, no syncope) and fractured his left hip.  His hip was pinned.   He presents today for regular follow up. He is doing well overall. Last visit restarted eplerenone. His brother helps him with his medicine, and he also has paramedicine. He denies lightheadedness or dizziness. Feels like his legs are swollen. He says he has sores on them. He denies orthopnea or PND. He is still using a walker and still having some pain in his hips and knees.   Medtronic device interrogation: Did not result at time of note.   Labs (12/15): K 4.1, creatinine  0.81, hgb 9.1 Labs (09/12/2014): K 3.7 Creatinine 0.96, digoxin 0.7 Labs (3/16): digoxin 0.8, LDL 62, HDL 40 Labs (4/16): K 4.3, creatinine 1.1 Labs (5/16) K 4.6, creatinine 1.24, digoxin 1.0 Labs (05/2016): K 3.6 Creatinine 1.47  => 1.37 Labs (11/17): LDL 61, HDL 41, K 4.2, creaitnine 1.1 Labs (2/18): K 3.7 => 5.3, creatinine 1.84 => 1.35, BNP 924 Labs (3/18): K 4.8, creatinine 1.33, BNP 552 Labs (9/18): K 3.9, creatinine 1.97 Labs (08/03/2018): K 4.2 Creatinine 1.6 Labs ( 09/09/2017): K 4.4 Creatinine 1.53 Labs (3/19): K 4.5, creatinine 1.5 Labs (6/19): LDL 71 Labs (12/19): hgb 11, K 3.5, creatinine 1.28  PMH: 1. Smoker 2. Mild mental retardation 3. CAD: LHC (12/15) with 3 vessel disease.  CABG x 5 in 12/15 with LIMA-LAD, SVG-D1, sequential SVG-OM2/OM3, SVG-PDA.   - Cardiolite (2/18):  Large inferoseptal/inferior/inferolateral infarction with no ischemia, EF 29%.  4. Ischemic cardiomyopathy: Echo (12/15) with EF 15-20%, wall motion abnormalities, mildly decreased RV systolic function, mild MR.  Echo (3/16) with EF 30-35%, severe LV dilation, moderate MR, PA systolic pressure 42 mmHg. Echo (8/16) with EF 25-30%, severely dilated LV, diffuse hypokinesis with inferior akinesis, restrictive diastolic function, RV mildly dilated with mildly decreased systolic function, moderate MR.  - Echo (10/17): EF 20-25%, moderate MR.  - Medtronic ICD.  - ACEI cough. Gynecomastia with spironolacon - Echo (2/18): EF 30-35%, mild LV dilation, regional WMAs, moderate diastolic dysfunction, normal RV size with mild to moderately decreased systolic function,  moderate MR (likely infarct-related).  - Echo (10/18): EF 20-25%, severe MR (likely ischemic).  5. Depression 6. Vitiligo 7. Mitral regurgitation: Moderate on 2/18, likely infarct-related.  - Severe on 10/18 echo, likely infarct-related. - TEE (6/19): EF 25-30%, severe ischemic MR with restricted posterior leaflet, mild RV dilation with mild to moderate  systolic dysfunction.  8. CKD stage III.  9. Melena- 08/24/2018 EGD/Colonoscopy polyp and gastritis.  10. Left hip fracture 11/19: s/p surgery.   SH: Lives alone, quit smoking in 12/15.  No ETOH.   FH: Brother and father with MIs.    Review of systems complete and found to be negative unless listed in HPI.    Current Outpatient Medications  Medication Sig Dispense Refill  . acetaminophen (TYLENOL) 325 MG tablet Take 325 mg by mouth every 6 (six) hours as needed for mild pain or moderate pain.     Marland Kitchen aspirin EC 81 MG tablet Take 1 tablet (81 mg total) by mouth daily. 90 tablet 3  . atorvastatin (LIPITOR) 40 MG tablet Take 1 tablet (40 mg total) by mouth daily at 6 PM. 30 tablet 1  . carvedilol (COREG) 3.125 MG tablet Take 3.125 mg by mouth 2 (two) times daily with a meal.    . citalopram (CELEXA) 20 MG tablet Take 20 mg by mouth daily.   5  . cyanocobalamin 500 MCG tablet Take 500 mcg by mouth daily.    Marland Kitchen eplerenone (INSPRA) 25 MG tablet Take 1 tablet (25 mg total) by mouth daily. 30 tablet 6  . ferrous sulfate 325 (65 FE) MG tablet Take 1 tablet (325 mg total) by mouth daily with breakfast. 30 tablet 3  . furosemide (LASIX) 80 MG tablet Take 80 mg by mouth daily.    Marland Kitchen HYDROcodone-acetaminophen (NORCO) 5-325 MG tablet Take 1-2 tablets by mouth every 6 (six) hours as needed for moderate pain. MAXIMUM TOTAL ACETAMINOPHEN DOSE IS 4000 MG PER DAY 30 tablet 0  . omeprazole (PRILOSEC) 20 MG capsule Take 20 mg by mouth 2 (two) times daily.    Marland Kitchen POTASSIUM CHLORIDE ER PO Take 20 mEq by mouth daily.    . sennosides-docusate sodium (SENOKOT-S) 8.6-50 MG tablet Take 2 tablets by mouth daily. 30 tablet 1  . baclofen (LIORESAL) 10 MG tablet Take 1 tablet (10 mg total) by mouth 3 (three) times daily. As needed for muscle spasm (Patient not taking: Reported on 09/16/2018) 50 tablet 0   No current facility-administered medications for this encounter.    Vitals:   09/16/18 1522  BP: 112/84  Pulse: 68   SpO2: 98%  Weight: 75.5 kg (166 lb 6.4 oz)    Wt Readings from Last 3 Encounters:  09/16/18 75.5 kg (166 lb 6.4 oz)  09/03/18 74.4 kg (164 lb)  08/26/18 72.3 kg (159 lb 6.4 oz)     Physical exam General: Well appearing. No resp difficulty. HEENT: Normal Neck: Supple. JVP 7-8 cm. Carotids 2+ bilat; no bruits. No thyromegaly or nodule noted. Cor: PMI nondisplaced. RRR, 3/6 HSM apex.  Lungs: CTAB, normal effort. Abdomen: Soft, non-tender, non-distended, no HSM. No bruits or masses. +BS  Extremities: No cyanosis, clubbing, or rash. Trace ankle edema. + excoriations near knee.  Neuro: Alert & orientedx3, cranial nerves grossly intact. moves all 4 extremities w/o difficulty. Affect pleasant   Assessment/Plan: 1. CAD: status post CABG.  Cardiolite in 2/18 showed infarction, no ischemia.  - No s/s of ischemia.     - Continue statin. Good lipids in 6/19.  -  Restart ASA 81 daily, this was stopped at time of hip fracture.  2. Chronic systolic CHF: Ischemic CMP.  Echo (10/18) with EF 20-25%.  Has Medtronic ICD.  He has mild volume overload on exam. Optivol did not result prior to him leaving the clinic. Weight is up.  NYHA class II. SBPs 110s today.  - Continue Lasix 40 mg BID. BMET today.   - Continue Coreg 3.125 mg bid. - Continue eplerenone 25 mg daily.  - Restart losartan 12.5 mg qhs.  3. Depression:   - Continue Celexa. No change.  4. Mitral regurgitation: Severe, probably infarct-related.  TEE in 6/19 confirmed severe ischemic MR.   He has seen Dr Burt Knack, should be a reasonable Mitraclip candidate when CMS approves coverage for functional MR.   - No change.  5. CKD Stage III:  - BMET Today.  6. H/O GI bleed:  - EGD/Colonoscopy with polyp noted and gastritis. Denies overt bleeding.   He is doing OK overall. Will clarify some of his medications with paramedicine. He is ordered for lasix 80 mg daily, but apparently taking 40 mg BID. Resume losartan, meds as above. RTC 2-3 months with  Dr. [name]. Sooner with symptoms.   Shirley Friar, PA-C  09/16/2018   Greater than 50% of the 25 minute visit was spent in counseling/coordination of care regarding disease state education, salt/fluid restriction, sliding scale diuretics, and medication compliance.

## 2018-09-16 NOTE — Patient Instructions (Addendum)
TAKE an extra Lasix 40mg  tomorrow morning  START Losartan 12.5mg  (1/2 tab) at bedtime  Labs today We will only contact you if something comes back abnormal or we need to make some changes. Otherwise no news is good news!  Your physician recommends that you schedule a follow-up appointment in: 2 months with Dr. [name]

## 2018-09-20 ENCOUNTER — Other Ambulatory Visit (HOSPITAL_COMMUNITY): Payer: Self-pay

## 2018-09-20 ENCOUNTER — Encounter (HOSPITAL_COMMUNITY): Payer: Self-pay

## 2018-09-20 ENCOUNTER — Inpatient Hospital Stay: Admit: 2018-09-20 | Payer: Self-pay

## 2018-09-20 ENCOUNTER — Other Ambulatory Visit
Admission: RE | Admit: 2018-09-20 | Discharge: 2018-09-20 | Disposition: A | Discharge status: Home / Self Care | Payer: Medicare Other | Source: Ambulatory Visit | Attending: Student | Admitting: Student

## 2018-09-20 ENCOUNTER — Ambulatory Visit (INDEPENDENT_AMBULATORY_CARE_PROVIDER_SITE_OTHER): Payer: Medicare Other

## 2018-09-20 DIAGNOSIS — Z95810 Presence of automatic (implantable) cardiac defibrillator: Secondary | ICD-10-CM

## 2018-09-20 DIAGNOSIS — I519 Heart disease, unspecified: Secondary | ICD-10-CM | POA: Insufficient documentation

## 2018-09-20 DIAGNOSIS — I5022 Chronic systolic (congestive) heart failure: Secondary | ICD-10-CM

## 2018-09-20 LAB — BASIC METABOLIC PANEL
ANION GAP: 8 (ref 5–15)
BUN: 23 mg/dL (ref 8–23)
CO2: 27 mmol/L (ref 22–32)
Calcium: 9.1 mg/dL (ref 8.9–10.3)
Chloride: 100 mmol/L (ref 98–111)
Creatinine, Ser: 1.37 mg/dL — ABNORMAL HIGH (ref 0.61–1.24)
GFR calc Af Amer: >60 mL/min (ref >60)
GFR calc non Af Amer: 53 mL/min — ABNORMAL LOW (ref >60)
Glucose, Bld: 95 mg/dL (ref 70–99)
Potassium: 3.8 mmol/L (ref 3.5–5.1)
Sodium: 135 mmol/L (ref 135–145)

## 2018-09-20 NOTE — Progress Notes (Signed)
98/58, pulse: 60, resp: 18, spo2: 97  Carl [name] states feeling good, he denies dizziness, headaches, shortness of breath or chest pain.  He states been out to eat, eggs, bacon, hashbrowns at [employer].  Explained to Carl [name] about eating lower salt foods.  He states Saturday ate out and had [employer], mash potatos and gravy.  Will continue to work with Carl [name] and his family about low sodium foods for him.  His sister takes him out to eat and he eats high sodium foods with her.  His brother has brought snacks into Carl [name]'s house that has a lot of sodium in them. Carl [name] states he still feels uncomfortable about weighing due to his hip.  Meds verified and placed meds in box.  Carl [name] had some med changes from HF clinic appt last week.  He is taking vit d3 every day 2,000 units. Lungs are clear, no swelling in lower extremities.  Carl [name] states physical therapy is coming out today.  Will continue to visit and work with Carl [name] about low sodium foods and for med compliance.   [place] [phone]

## 2018-09-21 NOTE — Progress Notes (Signed)
EPIC Encounter for ICM Monitoring  Patient Name: Carl [name] is a 68 y.o. male Date: 09/21/2018 Primary Care Physican: [name], MD Primary Cardiologist:[place] Electrophysiologist: Allred Last Weight: 166lbs(09/16/2018 office visit) Today's Weight: not weighing at home                                                           Spoke with brother, [name]. Heart Failure questions reviewed, pt asymptomatic.  Paramedicine note reports patient is still eating foods high in salt and dietary education continues regarding low salt foods.    Thoracic impedance abnormal suggesting fluid accumulation since 09/02/2018 but starting to trend toward baseline.   Prescribed: Furosemide40 mg1tablet (40 mg total)twice a day.Potassium 20 mEq take 2 tablets daily.  Labs: 09/20/2018 Creatinine 1.37, BUN 23, Potassium 3.8, Sodium 135, eGFR 53->60 09/16/2018 Creatinine 1.37, BUN 17, Potassium 2.7, Sodium 136, eGFR 53->60  08/26/2018 Creatinine 1.64, BUN 20, Potassium 3.5, Sodium 136, eGFR 43-49  07/25/2018 Creatinine 1.28, BUN 29, Potassium 3.5, Sodium 137, eGFR 58->60  07/24/2018 Creatinine 1.65, BUN 38, Potassium 3.8, Sodium 134, eGFR 42-49  07/23/2018 Creatinine 1.60, BUN 37, Potassium 3.4, Sodium 134, eGFR 44-51  07/21/2018 Creatinine 1.60, BUN 44, Potassium 3.2, Sodium 136 (7:17 PM) 07/21/2018 Creatinine 1.53, BUN 42, Potassium 3.2, Sodium 137, eGFR 47-54 (6:31 PM)  06/02/2018 Creatinine1.32, BUN28, Potassium3.5, Sodium136, eGFR55->60 05/04/2018 Creatinine1.48, BUN20, Potassium3.6, Sodium136, [id number]  04/13/2018 Creatinine1.64, BUN23, Potassium3.7, [id number], [id number]  A complete set of results can be found in Results Review.  Recommendations:  Advised pt should be eating low salt diet.  Will call back if any recommendations from physicians.    Follow-up plan: ICM clinic phone appointment on 09/28/2018 to recheck fluid levels.       Copy of ICM check sent to Dr. Rayann Heman  and Dr [name] for review and recommendations if needed.   3 month ICM trend: 09/21/2018    1 Year ICM trend:       Rosalene Billings, RN 09/21/2018 7:51 AM

## 2018-09-23 ENCOUNTER — Other Ambulatory Visit (HOSPITAL_COMMUNITY): Payer: Self-pay | Admitting: Cardiology

## 2018-09-24 ENCOUNTER — Other Ambulatory Visit (HOSPITAL_COMMUNITY): Payer: Medicare Other

## 2018-09-26 NOTE — Progress Notes (Signed)
Increase Lasix to 60 mg bid with BMET in 10 days.

## 2018-09-27 ENCOUNTER — Encounter (HOSPITAL_COMMUNITY): Payer: Self-pay | Admitting: *Deleted

## 2018-09-27 ENCOUNTER — Other Ambulatory Visit (HOSPITAL_COMMUNITY): Payer: Self-pay

## 2018-09-27 MED ORDER — FUROSEMIDE 40 MG PO TABS: ORAL_TABLET | ORAL | 3 refills | Status: DC

## 2018-09-27 NOTE — Progress Notes (Signed)
When arrived no answer and [place] not answering his phone.  Contact his brother [name] and he states he will try to contact him.  Tkai contacted me back and he is out shopping and eating with his sister [name].  Julio Sicks and he states he will go by this evening and give him extra 20 mg furosemide.  I advised Rayjon I will come back in the morning and will fix his medications for him.  Asked [name] to remind him so he will not forget.   [place]  [phone]

## 2018-09-27 NOTE — Progress Notes (Signed)
Call to brother.  Advised Dr [name] ordered to increase Furosemide 40 mg to 1.5 tablets (60 mg total) twice a day.  Repeated instructions several times and he repeated back correctly.  Advised to have BMET drawn 10/07/2018 at HF clinic. Message sen to [name], Paramedicine regarding medication change.

## 2018-09-27 NOTE — Progress Notes (Signed)
Received fax from pt's  [facility] case manager Colletta Maryland requesting  Last 2 OV notes, echo and list of meds.  Records faxed to her at [phone]

## 2018-09-27 NOTE — Addendum Note (Signed)
Addended by: Rosalene Billings on: 09/27/2018 10:07 AM   Modules accepted: Orders

## 2018-09-28 ENCOUNTER — Ambulatory Visit (INDEPENDENT_AMBULATORY_CARE_PROVIDER_SITE_OTHER): Payer: Medicare Other

## 2018-09-28 ENCOUNTER — Encounter (HOSPITAL_COMMUNITY): Payer: Self-pay

## 2018-09-28 ENCOUNTER — Other Ambulatory Visit (HOSPITAL_COMMUNITY): Payer: Self-pay

## 2018-09-28 ENCOUNTER — Telehealth: Payer: Self-pay

## 2018-09-28 DIAGNOSIS — Z95810 Presence of automatic (implantable) cardiac defibrillator: Secondary | ICD-10-CM

## 2018-09-28 DIAGNOSIS — I5022 Chronic systolic (congestive) heart failure: Secondary | ICD-10-CM

## 2018-09-28 NOTE — Progress Notes (Signed)
Bp: 90/60, pulse: 68, resp: 18, spo2: 97  Carl [name] is still unable to weigh, he feels off balance.  He still has physical therapy coming.  He states been getting around pretty good lately.  His sister gets him and takes him off to eat, very high sodium foods.  I have talked with him about eating the foods but can not get through to him, also have talked to his sister.  He ate fried fish yesterday then ate barbecue for supper last night.  Will continue to try to get him and his family to get better choices for him.  He states feeling ok, he has no complaints of chest pain, abdominal tightness, headaches or dizziness.  Lungs are clear.  No swelling in lower extremities.  Meds are verified and med boxes were refilled.  Will continue to visit weekly for heart failure and educate on diet.   [place] [phone]

## 2018-09-28 NOTE — Telephone Encounter (Signed)
Spoke with [name] brother, DPR and requested to send remote transmission to recheck fluid levels. He verbalized understanding.

## 2018-09-29 NOTE — Progress Notes (Signed)
EPIC Encounter for ICM Monitoring  Patient Name: Carl [name] is a 68 y.o. male Date: 09/29/2018 Primary Care Physican: [name], MD Primary [place] Electrophysiologist: Allred Last Weight:166lbs(09/16/2018 office visit) Today's Weight:not weighing at home   Spoke with brother, [name].Heart Failure questions reviewed. He said patient is feeling fine.  Patient frequently eats at restaurants.   Report: Thoracic impedance improved since increase in Furosemide maintenance dose to 60 mg bid and trending closer to baseline.    Prescribed: Furosemide 40 mg take 1.5 tablets (60 mg total) twice a day.   Labs: BMET scheduled for 10/07/2018 09/20/2018 Creatinine 1.37, BUN 23, Potassium 3.8, Sodium 135, eGFR 53->60 09/16/2018 Creatinine 1.37, BUN 17, Potassium 2.7, Sodium 136, eGFR 53->60  08/26/2018 Creatinine 1.64, BUN 20, Potassium 3.5, Sodium 136, eGFR 43-49  07/25/2018 Creatinine 1.28, BUN 29, Potassium 3.5, Sodium 137, eGFR 58->60  07/24/2018 Creatinine 1.65, BUN 38, Potassium 3.8, Sodium 134, eGFR 42-49  07/23/2018 Creatinine 1.60, BUN 37, Potassium 3.4, Sodium 134, eGFR 44-51  07/21/2018 Creatinine 1.60, BUN 44, Potassium 3.2, Sodium 136 (7:17 PM) 07/21/2018 Creatinine 1.53, BUN 42, Potassium 3.2, Sodium 137, eGFR 47-54 (6:31 PM)  06/02/2018 Creatinine1.32, BUN28, Potassium3.5, Sodium136, eGFR55->60 05/04/2018 Creatinine1.48, BUN20, Potassium3.6, Sodium136, [id number]  04/13/2018 Creatinine1.64, BUN23, Potassium3.7, [id number], [id number]  A complete set of results can be found in Results Review  Recommendations: No changes.  Reinforced limiting salt intake to < 2000 mg daily.  Encouraged to call for fluid symptoms.  Follow-up plan: ICM clinic phone appointment on 11/01/2018   Office appointment scheduled 11/11/2018 with Dr. [name].    Copy of ICM check sent to Dr. Rayann Heman and Dr [name].   3 month ICM trend: 09/29/2018    1 Year ICM trend:        Rosalene Billings, RN 09/29/2018 9:00 AM

## 2018-10-04 ENCOUNTER — Ambulatory Visit (INDEPENDENT_AMBULATORY_CARE_PROVIDER_SITE_OTHER): Payer: Medicare Other

## 2018-10-04 DIAGNOSIS — I255 Ischemic cardiomyopathy: Secondary | ICD-10-CM

## 2018-10-04 DIAGNOSIS — I5022 Chronic systolic (congestive) heart failure: Secondary | ICD-10-CM

## 2018-10-05 ENCOUNTER — Other Ambulatory Visit (HOSPITAL_COMMUNITY): Payer: Self-pay

## 2018-10-05 ENCOUNTER — Encounter (HOSPITAL_COMMUNITY): Payer: Self-pay

## 2018-10-05 LAB — CUP PACEART REMOTE DEVICE CHECK
Battery Remaining Longevity: 112 mo
Battery Voltage: 3.01 V
Brady Statistic RV Percent Paced: 0.01 %
Date Time Interrogation Session: 20200211072824
HighPow Impedance: 66 Ohm
Implantable Lead Implant Date: 20160906
Implantable Lead Location: 753860
Implantable Pulse Generator Implant Date: 20160906
Lead Channel Impedance Value: 399 Ohm
Lead Channel Pacing Threshold Amplitude: 0.625 V
Lead Channel Pacing Threshold Pulse Width: 0.4 ms
Lead Channel Sensing Intrinsic Amplitude: 10.125 mV
Lead Channel Setting Pacing Amplitude: 2.5 V
Lead Channel Setting Pacing Pulse Width: 0.4 ms
Lead Channel Setting Sensing Sensitivity: 0.3 mV
MDC IDC MSMT LEADCHNL RV IMPEDANCE VALUE: 342 Ohm
MDC IDC MSMT LEADCHNL RV SENSING INTR AMPL: 10.125 mV

## 2018-10-05 NOTE — Progress Notes (Signed)
Bp: 90/50, pulse: 70, resp: 18, spo2: 95  Renner states been feeling good.  He states will start weighing tomorrow. Meds verified and med box refilled.  [place] has been taking all his medications.  Hard to educate Price on eating low sodium, will continue to try to educate him and his family.  His sister brings his meals a lot and they are not low sodium most times.  Today Derry states he has not ate, he got up late.  He did say ate a piece of a biscuit with his medications this morning.  Advised him he needs to eat, a lot of days he goes most of day and does not eat. Lungs are clear, some swelling in extremities, no pitting.  He denies headaches, chest pain or dizziness.  Denies shortness of breath.  He is now walking with a cane.  Meds verified and med box refilled.  Will continue to visit for heart failure, diet and medication compliance.  [place] [phone]

## 2018-10-07 ENCOUNTER — Ambulatory Visit (HOSPITAL_COMMUNITY)
Admission: RE | Admit: 2018-10-07 | Discharge: 2018-10-07 | Disposition: A | Discharge status: Home / Self Care | Payer: Medicare Other | Source: Ambulatory Visit | Attending: Cardiology | Admitting: Cardiology

## 2018-10-07 DIAGNOSIS — I5022 Chronic systolic (congestive) heart failure: Secondary | ICD-10-CM | POA: Diagnosis not present

## 2018-10-07 LAB — BASIC METABOLIC PANEL
Anion gap: 11 (ref 5–15)
BUN: 22 mg/dL (ref 8–23)
CALCIUM: 9.4 mg/dL (ref 8.9–10.3)
CHLORIDE: 94 mmol/L — AB (ref 98–111)
CO2: 29 mmol/L (ref 22–32)
Creatinine, Ser: 1.44 mg/dL — ABNORMAL HIGH (ref 0.61–1.24)
GFR calc Af Amer: 58 mL/min — ABNORMAL LOW (ref >60)
GFR calc non Af Amer: 50 mL/min — ABNORMAL LOW (ref >60)
Glucose, Bld: 78 mg/dL (ref 70–99)
Potassium: 4.6 mmol/L (ref 3.5–5.1)
Sodium: 134 mmol/L — ABNORMAL LOW (ref 135–145)

## 2018-10-12 ENCOUNTER — Other Ambulatory Visit (HOSPITAL_COMMUNITY): Payer: Self-pay | Admitting: Student

## 2018-10-14 ENCOUNTER — Encounter: Payer: Self-pay | Admitting: Podiatry

## 2018-10-14 ENCOUNTER — Ambulatory Visit (INDEPENDENT_AMBULATORY_CARE_PROVIDER_SITE_OTHER): Payer: Medicare Other | Admitting: Podiatry

## 2018-10-14 DIAGNOSIS — B351 Tinea unguium: Secondary | ICD-10-CM

## 2018-10-14 DIAGNOSIS — L84 Corns and callosities: Secondary | ICD-10-CM | POA: Diagnosis not present

## 2018-10-14 DIAGNOSIS — M79674 Pain in right toe(s): Secondary | ICD-10-CM

## 2018-10-14 DIAGNOSIS — M79675 Pain in left toe(s): Secondary | ICD-10-CM | POA: Diagnosis not present

## 2018-10-14 NOTE — Progress Notes (Signed)
Complaint:  Visit Type: Patient returns to my office for continued preventative foot care services. Complaint: Patient states" my nails have grown long and thick and become painful to walk and wear shoes"  The patient presents for preventative foot care services. No changes to ROS  Podiatric Exam: Vascular: dorsalis pedis and posterior tibial pulses are palpable bilateral. Capillary return is immediate. Temperature gradient is WNL. Skin turgor WNL  Sensorium: Normal Semmes Weinstein monofilament test. Normal tactile sensation bilaterally. Nail Exam: Pt has thick disfigured discolored nails with subungual debris noted bilateral entire nail hallux through fifth toenails Ulcer Exam: There is no evidence of ulcer or pre-ulcerative changes or infection. Orthopedic Exam: Muscle tone and strength are WNL. No limitations in general ROM. No crepitus or effusions noted. Foot type and digits show no abnormalities. Bony prominences are unremarkable.! And 1/2 inch limb leg discrepancy. Skin: No Porokeratosis. No infection or ulcers.  Heloma durum fifth toe left foot.  Diagnosis:  Onychomycosis, , Pain in right toe, pain in left toes,  Heloma durum fifth toe left foot.  Treatment & Plan Procedures and Treatment: Consent by patient was obtained for treatment procedures.   Debridement of mycotic and hypertrophic toenails, 1 through 5 bilateral and clearing of subungual debris. No ulceration, no infection noted. Debride hd fifth toe left foot. Return  Office Procedure: Patient instructed to return to the office for a follow up visit 3 months for continued evaluation and treatment.    Gardiner Barefoot DPM

## 2018-10-14 NOTE — Progress Notes (Signed)
Remote ICD transmission.   

## 2018-10-16 ENCOUNTER — Emergency Department
Admission: EM | Admit: 2018-10-16 | Discharge: 2018-10-16 | Disposition: A | Discharge status: Other Acute Inpatient Hospital | Payer: Medicare Other | Attending: Emergency Medicine | Admitting: Emergency Medicine

## 2018-10-16 ENCOUNTER — Other Ambulatory Visit: Payer: Self-pay

## 2018-10-16 ENCOUNTER — Emergency Department: Payer: Medicare Other

## 2018-10-16 DIAGNOSIS — S32009A Unspecified fracture of unspecified lumbar vertebra, initial encounter for closed fracture: Secondary | ICD-10-CM | POA: Diagnosis not present

## 2018-10-16 DIAGNOSIS — S27321A Contusion of lung, unilateral, initial encounter: Secondary | ICD-10-CM | POA: Diagnosis not present

## 2018-10-16 DIAGNOSIS — I2510 Atherosclerotic heart disease of native coronary artery without angina pectoris: Secondary | ICD-10-CM | POA: Diagnosis not present

## 2018-10-16 DIAGNOSIS — Y92410 Unspecified street and highway as the place of occurrence of the external cause: Secondary | ICD-10-CM | POA: Insufficient documentation

## 2018-10-16 DIAGNOSIS — I130 Hypertensive heart and chronic kidney disease with heart failure and stage 1 through stage 4 chronic kidney disease, or unspecified chronic kidney disease: Secondary | ICD-10-CM | POA: Diagnosis not present

## 2018-10-16 DIAGNOSIS — Y9389 Activity, other specified: Secondary | ICD-10-CM | POA: Diagnosis not present

## 2018-10-16 DIAGNOSIS — I7100 Dissection of unspecified site of aorta: Secondary | ICD-10-CM | POA: Insufficient documentation

## 2018-10-16 DIAGNOSIS — S0990XA Unspecified injury of head, initial encounter: Secondary | ICD-10-CM | POA: Diagnosis not present

## 2018-10-16 DIAGNOSIS — Z7982 Long term (current) use of aspirin: Secondary | ICD-10-CM | POA: Diagnosis not present

## 2018-10-16 DIAGNOSIS — N183 Chronic kidney disease, stage 3 (moderate): Secondary | ICD-10-CM | POA: Insufficient documentation

## 2018-10-16 DIAGNOSIS — S3210XA Unspecified fracture of sacrum, initial encounter for closed fracture: Secondary | ICD-10-CM | POA: Diagnosis not present

## 2018-10-16 DIAGNOSIS — S299XXA Unspecified injury of thorax, initial encounter: Secondary | ICD-10-CM | POA: Diagnosis present

## 2018-10-16 DIAGNOSIS — Y999 Unspecified external cause status: Secondary | ICD-10-CM | POA: Insufficient documentation

## 2018-10-16 DIAGNOSIS — I5022 Chronic systolic (congestive) heart failure: Secondary | ICD-10-CM | POA: Diagnosis not present

## 2018-10-16 LAB — CBC WITH DIFFERENTIAL/PLATELET
Abs Immature Granulocytes: 0.07 10*3/uL (ref 0.00–0.07)
BASOS ABS: 0 10*3/uL (ref 0.0–0.1)
Basophils Relative: 1 %
Eosinophils Absolute: 0.1 10*3/uL (ref 0.0–0.5)
Eosinophils Relative: 1 %
HCT: 38.4 % — ABNORMAL LOW (ref 39.0–52.0)
Hemoglobin: 12.4 g/dL — ABNORMAL LOW (ref 13.0–17.0)
Immature Granulocytes: 1 %
Lymphocytes Relative: 8 %
Lymphs Abs: 0.7 10*3/uL (ref 0.7–4.0)
MCH: 32.5 pg (ref 26.0–34.0)
MCHC: 32.3 g/dL (ref 30.0–36.0)
MCV: 100.8 fL — ABNORMAL HIGH (ref 80.0–100.0)
Monocytes Absolute: 0.3 10*3/uL (ref 0.1–1.0)
Monocytes Relative: 3 %
NEUTROS ABS: 7 10*3/uL (ref 1.7–7.7)
Neutrophils Relative %: 86 %
PLATELETS: 138 10*3/uL — AB (ref 150–400)
RBC: 3.81 MIL/uL — ABNORMAL LOW (ref 4.22–5.81)
RDW: 13.4 % (ref 11.5–15.5)
WBC: 8.1 10*3/uL (ref 4.0–10.5)
nRBC: 0 % (ref 0.0–0.2)

## 2018-10-16 LAB — BASIC METABOLIC PANEL
Anion gap: 7 (ref 5–15)
BUN: 47 mg/dL — ABNORMAL HIGH (ref 8–23)
CO2: 28 mmol/L (ref 22–32)
Calcium: 8.7 mg/dL — ABNORMAL LOW (ref 8.9–10.3)
Chloride: 99 mmol/L (ref 98–111)
Creatinine, Ser: 2.01 mg/dL — ABNORMAL HIGH (ref 0.61–1.24)
GFR calc Af Amer: 39 mL/min — ABNORMAL LOW (ref >60)
GFR, EST NON AFRICAN AMERICAN: 33 mL/min — AB (ref >60)
Glucose, Bld: 105 mg/dL — ABNORMAL HIGH (ref 70–99)
Potassium: 4.5 mmol/L (ref 3.5–5.1)
Sodium: 134 mmol/L — ABNORMAL LOW (ref 135–145)

## 2018-10-16 MED ORDER — SODIUM CHLORIDE 0.9 % IV BOLUS
1000.0000 mL | Freq: Once | INTRAVENOUS | Status: AC
  Administered 2018-10-16: 1000 mL via INTRAVENOUS

## 2018-10-16 MED ORDER — IOHEXOL 300 MG/ML  SOLN
75.0000 mL | Freq: Once | INTRAMUSCULAR | Status: AC | PRN
  Administered 2018-10-16: 75 mL via INTRAVENOUS

## 2018-10-16 NOTE — ED Notes (Signed)
Pt had stool on self. Bed linens changed. Peri care provided. Urinated. Urine sample sent to lab.

## 2018-10-16 NOTE — ED Notes (Signed)
Family requesting pain meds or hot packs for pt's back pain; EDP notified.

## 2018-10-16 NOTE — ED Notes (Signed)
Pt given pillow and readjusted in bed.

## 2018-10-16 NOTE — ED Notes (Signed)
Pt leaving for imaging.
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
## 2018-10-16 NOTE — ED Notes (Signed)
EKG completed

## 2018-10-16 NOTE — ED Notes (Signed)
Pt asked to go to bathroom, Tech Judson Roch) got him a walker, he was unable to walk so tech put him on bed pan, and then he said he wouldn't be able to go. Pt was told to call if he felt the urge to go again.

## 2018-10-16 NOTE — ED Notes (Signed)
Pt requesting water--verb order to hold per Alfred Levins until CT gets back. Pt given mouth moisturizer.
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
## 2018-10-17 DIAGNOSIS — I5022 Chronic systolic (congestive) heart failure: Secondary | ICD-10-CM | POA: Insufficient documentation

## 2018-10-17 DIAGNOSIS — S36892A Contusion of other intra-abdominal organs, initial encounter: Secondary | ICD-10-CM | POA: Insufficient documentation

## 2018-10-17 DIAGNOSIS — R9430 Abnormal result of cardiovascular function study, unspecified: Secondary | ICD-10-CM | POA: Insufficient documentation

## 2018-10-17 DIAGNOSIS — S27322A Contusion of lung, bilateral, initial encounter: Secondary | ICD-10-CM | POA: Insufficient documentation

## 2018-10-17 DIAGNOSIS — Z95810 Presence of automatic (implantable) cardiac defibrillator: Secondary | ICD-10-CM | POA: Insufficient documentation

## 2018-10-17 DIAGNOSIS — Z8781 Personal history of (healed) traumatic fracture: Secondary | ICD-10-CM | POA: Insufficient documentation

## 2018-10-17 DIAGNOSIS — Z8679 Personal history of other diseases of the circulatory system: Secondary | ICD-10-CM | POA: Insufficient documentation

## 2018-10-17 DIAGNOSIS — R931 Abnormal findings on diagnostic imaging of heart and coronary circulation: Secondary | ICD-10-CM | POA: Insufficient documentation

## 2018-10-22 ENCOUNTER — Telehealth (HOSPITAL_COMMUNITY): Payer: Self-pay

## 2018-10-22 NOTE — Telephone Encounter (Signed)
Spoke with Aleksandr's brother Hagen, Bohorquez is still at [facility] doing well.  He will most likely be discharged Monday to physical therapy for short period then home.  [name] states he will let me know what facility and when for sure.   [place] [phone]

## 2018-10-28 ENCOUNTER — Other Ambulatory Visit: Payer: Self-pay | Admitting: Cardiology

## 2018-11-01 ENCOUNTER — Telehealth: Payer: Self-pay

## 2018-11-01 ENCOUNTER — Ambulatory Visit (INDEPENDENT_AMBULATORY_CARE_PROVIDER_SITE_OTHER): Payer: Medicare Other

## 2018-11-01 DIAGNOSIS — I5022 Chronic systolic (congestive) heart failure: Secondary | ICD-10-CM | POA: Diagnosis not present

## 2018-11-01 DIAGNOSIS — Z95810 Presence of automatic (implantable) cardiac defibrillator: Secondary | ICD-10-CM | POA: Diagnosis not present

## 2018-11-01 NOTE — Telephone Encounter (Signed)
Spoke with patient to remind of missed remote transmission 

## 2018-11-01 NOTE — Telephone Encounter (Signed)
Returned call to brother [name].  He reported patient is in rehab.  His BP has been low and rehab physician thinks that patient is dehydrated.  The rehab physician stopped Furosemide and encouraged patient to drink more fluids.  He said patient is not eating or drinking very much.  Encouraged to have him try supplement drink such as Boost or Ensure. He will take the home monitor to rehab this evening and send report.  Advised will call him tomorrow after report is reviewed.  Advised patient has appt with Dr [name] on 11/11/2018.

## 2018-11-02 ENCOUNTER — Other Ambulatory Visit (HOSPITAL_COMMUNITY): Payer: Self-pay | Admitting: Cardiology

## 2018-11-02 NOTE — Progress Notes (Signed)
EPIC Encounter for ICM Monitoring  Patient Name: Carl [name] is a 68 y.o. male Date: 11/02/2018 Primary Care Physican: [name], MD Primary Cardiologist:[place] Electrophysiologist: Allred Last Weight:166lbs(09/16/2018 office visit) Today's Weight:not weighing at home  Observations (2) (18-Oct-2018 to 01-Nov-2018)  Alert: AF >= 6 hr for 2 days.  Patient Activity less than 1 hr/day for 2 weeks.  Time in AF 1.8 hr/day (7.5%)  AF Episodes           102  Time in AF = 26 hours compared to prior session 2/11 to 2/24 which had no time in AF   Spoke with brother, [name]. Patient is currently in rehab, [facility] (phone [phone]) on [facility] following [place] .   No projected date for rehab discharge yet.  Message sent to device clinic to review 3/9 report that says he has had Afib.   Call to [facility] and spoke with nurse and reviewed current meds he is taking.  See list below:   She said the following is NOT on his list: Carvedilol, Potassium, Losartan, Metolazone, Eplerenone which were all the meds he was on prior to hospitalization/rehab.     Report: Thoracic impedance abnormal suggesting fluid accumulation since 08/30/2018 with exception of 1 day at baseline. Impedance is trending back toward baseline.      Prescribed: Furosemide 40 mg take 1.5 tablets (60 mg total) twice a day.   Labs: 10/16/2018 Creatinine 2.01, BUN 47, Potassium 4.5, Sodium 134, GFR 33-39 10/07/2018 Creatinine 1.44, BUN 22, Potassium 4.6, Sodium 134, GFR 50-58  09/20/2018 Creatinine1.37, BUN23, Potassium3.8, Sodium135, GFR53->60 09/16/2018 Creatinine1.37, BUN17, Potassium2.7, Sodium136, GFR53->60  08/26/2018 Creatinine1.64, BUN20, Potassium3.5, Sodium136, [id number]  12/01/2019Creatinine 1.28, BUN29, Potassium3.5, [id number], GFR58->60  07/24/2018 Creatinine1.65, BUN38, Potassium3.8, Sodium134, [id number]  07/23/2018  Creatinine1.60, BUN37, Potassium3.4, Sodium134, PYP95-09  07/21/2018 Creatinine1.60, BUN44, Potassium3.2, Sodium136 (7:17 PM) 07/21/2018 Creatinine1.53, BUN42, Potassium3.2, [id number], [id number] (6:31 PM) A complete set of results can be found in Results Review  Recommendations:   Advised patients brother will send to Dr [name] and [name] for review.  Pamala Hurry, the nurse at [facility] said if there is medication order, fax to [phone] but call the nurse first to advise that a fax will be sent.     Follow-up plan: ICM clinic phone appointment on 11/08/2018   Office appointment scheduled 11/11/2018 with Dr. [name].    Copy of ICM check sent to Dr. Rayann Heman and Dr [name].    3 month ICM trend: 11/01/2018    AT/AF    1 Year ICM trend:       Rosalene Billings, RN 11/02/2018 12:01 PM

## 2018-11-03 NOTE — Progress Notes (Signed)
Carl Marshall, NP reviewed remote transmission and provided following information.   Received: Today  Message Contents  [name], NP  Short, Laurie Panda, RN        AF episodes appear to be sinus with frequent ectopy

## 2018-11-07 NOTE — Progress Notes (Signed)
Needs office visit very soon, can be with APP.  1. Increase Lasix to 80 mg bid but will need BMET later this week with creatinine at 2 in 2/20.  2. Stop ASA 3. Start Eliquis 5 mg bid.

## 2018-11-08 ENCOUNTER — Other Ambulatory Visit: Payer: Self-pay

## 2018-11-08 ENCOUNTER — Ambulatory Visit (INDEPENDENT_AMBULATORY_CARE_PROVIDER_SITE_OTHER): Payer: Medicare Other

## 2018-11-08 DIAGNOSIS — I5022 Chronic systolic (congestive) heart failure: Secondary | ICD-10-CM

## 2018-11-08 DIAGNOSIS — Z95810 Presence of automatic (implantable) cardiac defibrillator: Secondary | ICD-10-CM

## 2018-11-08 NOTE — Progress Notes (Signed)
Message sent to Dr [name] advising [name], NP reviewed report and reported AF episodes appear to be sinus with frequent ectopy.  Message received back about Eliquis order.    Received: Today  Message Contents  [name], MD  Evee Liska Panda, RN        No, no Eliquis if this was NSR.

## 2018-11-08 NOTE — Progress Notes (Signed)
Spoke with Langley Gauss, patient's nurse at [facility] (phone [phone]).  Advised Dr [name] ordered to increase Furosemide to 80 mg twice a day due to fluid accumulation.  She said Furosemide was decreased in the last few days by the Rehab physician due to patient's low BP of 80s/50-60.  Advised I will inform Dr [name] of the low BP and call back if he wants to continue with the change in Furosemide and reminded patient has appointment with Dr [name] on Thursday 3/19.  Message sent to Dr [name] for further recommendations.

## 2018-11-09 NOTE — Progress Notes (Signed)
EPIC Encounter for ICM Monitoring  Patient Name: Carl [name] is a 68 y.o. male Date: 11/09/2018 Primary Care Physican: [name], MD Primary Cardiologist:McLean Electrophysiologist: Allred Last Weight:166lbs(09/16/2018 office visit) Today's Weight:in rehab center      Transmission reviewed.  No change in impedance since 3/9 report due due to rehab center notified today to increase Furosemide back to original dosage of 60 mg bid as instructed by Dr [name] (see 11/01/2018 ICM note).  The dosage was changed last week by facility physician to 30 mg bid in response to low blood pressures.    Report: Thoracic impedance abnormal suggesting fluid accumulation since 08/30/2018 with exception of 1 day at baseline. Impedance is trending back toward baseline.  .   Prescribed:  Labs: 10/16/2018 Creatinine 2.01, BUN 47, Potassium 4.5, Sodium 134, GFR 33-39 10/07/2018 Creatinine 1.44, BUN 22, Potassium 4.6, Sodium 134, GFR 50-58  09/20/2018 Creatinine1.37, BUN23, Potassium3.8, Sodium135, GFR53->60 09/16/2018 Creatinine1.37, BUN17, Potassium2.7, Sodium136, GFR53->60  08/26/2018 Creatinine1.64, BUN20, Potassium3.5, Sodium136, [id number]  12/01/2019Creatinine 1.28, BUN29, Potassium3.5, [id number], GFR58->60  07/24/2018 Creatinine1.65, BUN38, Potassium3.8, Sodium134, [id number]  07/23/2018 Creatinine1.60, BUN37, Potassium3.4, Sodium134, GFR44-51  07/21/2018 Creatinine1.60, BUN44, Potassium3.2, Sodium136 (7:17 PM) 07/21/2018 Creatinine1.53, BUN42, Potassium3.2, [id number], QIO96-29 (6:31 PM) A complete set of results can be found in Results Review  Recommendations:  Call to patient's nurse, Pamala Hurry at [facility] and advised Dr Claris Gladden has recommended (see 3/9 ICM note) Furosemide be returned to original dosage of 60 mg twice a day and he requested to be contacted for any future change medication changes.  Advised I faxed the original order to  [phone] and to call back if she does not receive the order.  She verbalized understanding.  Follow-up plan: ICM clinic phone appointment on 11/16/2018 to recheck fluid levels.   Office appt 11/11/2018 with Dr. [name].    Copy of ICM check sent to Dr. Rayann Heman.   3 month ICM trend: 11/08/2018    1 Year ICM trend:       Rosalene Billings, RN 11/09/2018 2:55 PM

## 2018-11-09 NOTE — Progress Notes (Signed)
Received response to message sent to Dr [name] regarding Furosemide order.  He recommended the following:  Received: Today  Message Contents  [name], MD  Short, Laurie Panda, RN        I would have them increase his furosemide up to his prior dose and have them ask [place] before lowering it (tell facility MD not to touch the Lasix without asking me).     Patient's prior dosage was Furosemide 60 mg bid.

## 2018-11-09 NOTE — Progress Notes (Signed)
Call to patient's nurse, Pamala Hurry at [facility] and advised Dr Claris Gladden has recommended Furosemide be returned to original dosage of 60 mg twice a day and he requested to be contacted for any future change medication changes.  Advised I faxed the original order to [phone] and to call back if she does not receive the order.  She verbalized understanding.

## 2018-11-11 ENCOUNTER — Other Ambulatory Visit: Payer: Self-pay

## 2018-11-11 ENCOUNTER — Ambulatory Visit (HOSPITAL_COMMUNITY)
Admission: RE | Admit: 2018-11-11 | Discharge: 2018-11-11 | Disposition: A | Discharge status: Home / Self Care | Payer: Medicare Other | Source: Ambulatory Visit | Attending: Cardiology | Admitting: Cardiology

## 2018-11-11 ENCOUNTER — Encounter (HOSPITAL_COMMUNITY): Payer: Self-pay | Admitting: Cardiology

## 2018-11-11 VITALS — BP 138/78 | HR 97 | Wt 163.6 lb

## 2018-11-11 DIAGNOSIS — I255 Ischemic cardiomyopathy: Secondary | ICD-10-CM | POA: Diagnosis not present

## 2018-11-11 DIAGNOSIS — I4510 Unspecified right bundle-branch block: Secondary | ICD-10-CM | POA: Diagnosis not present

## 2018-11-11 DIAGNOSIS — F70 Mild intellectual disabilities: Secondary | ICD-10-CM | POA: Diagnosis not present

## 2018-11-11 DIAGNOSIS — Z87891 Personal history of nicotine dependence: Secondary | ICD-10-CM | POA: Diagnosis not present

## 2018-11-11 DIAGNOSIS — I340 Nonrheumatic mitral (valve) insufficiency: Secondary | ICD-10-CM

## 2018-11-11 DIAGNOSIS — Z951 Presence of aortocoronary bypass graft: Secondary | ICD-10-CM | POA: Diagnosis not present

## 2018-11-11 DIAGNOSIS — Z79899 Other long term (current) drug therapy: Secondary | ICD-10-CM | POA: Insufficient documentation

## 2018-11-11 DIAGNOSIS — I5022 Chronic systolic (congestive) heart failure: Secondary | ICD-10-CM

## 2018-11-11 DIAGNOSIS — F329 Major depressive disorder, single episode, unspecified: Secondary | ICD-10-CM | POA: Diagnosis not present

## 2018-11-11 DIAGNOSIS — Z7982 Long term (current) use of aspirin: Secondary | ICD-10-CM | POA: Diagnosis not present

## 2018-11-11 DIAGNOSIS — Z8249 Family history of ischemic heart disease and other diseases of the circulatory system: Secondary | ICD-10-CM | POA: Insufficient documentation

## 2018-11-11 DIAGNOSIS — N183 Chronic kidney disease, stage 3 (moderate): Secondary | ICD-10-CM | POA: Insufficient documentation

## 2018-11-11 DIAGNOSIS — I25810 Atherosclerosis of coronary artery bypass graft(s) without angina pectoris: Secondary | ICD-10-CM | POA: Insufficient documentation

## 2018-11-11 DIAGNOSIS — R9431 Abnormal electrocardiogram [ECG] [EKG]: Secondary | ICD-10-CM | POA: Diagnosis not present

## 2018-11-11 LAB — LIPID PANEL
Cholesterol: 103 mg/dL (ref 0–200)
HDL: 24 mg/dL — ABNORMAL LOW (ref >40)
LDL CALC: 66 mg/dL (ref 0–99)
Total CHOL/HDL Ratio: 4.3 RATIO
Triglycerides: 63 mg/dL (ref <150)
VLDL: 13 mg/dL (ref 0–40)

## 2018-11-11 LAB — BASIC METABOLIC PANEL
Anion gap: 11 (ref 5–15)
BUN: 15 mg/dL (ref 8–23)
CO2: 26 mmol/L (ref 22–32)
CREATININE: 1.45 mg/dL — AB (ref 0.61–1.24)
Calcium: 9.4 mg/dL (ref 8.9–10.3)
Chloride: 99 mmol/L (ref 98–111)
GFR calc Af Amer: 57 mL/min — ABNORMAL LOW (ref >60)
GFR calc non Af Amer: 49 mL/min — ABNORMAL LOW (ref >60)
Glucose, Bld: 110 mg/dL — ABNORMAL HIGH (ref 70–99)
Potassium: 3.6 mmol/L (ref 3.5–5.1)
Sodium: 136 mmol/L (ref 135–145)

## 2018-11-11 MED ORDER — EPLERENONE 25 MG PO TABS: 25.0000 mg | ORAL_TABLET | Freq: Every day | ORAL | 3 refills | Status: DC

## 2018-11-11 MED ORDER — CARVEDILOL 3.125 MG PO TABS: 3.1250 mg | ORAL_TABLET | Freq: Two times a day (BID) | ORAL | 3 refills | Status: DC

## 2018-11-11 MED ORDER — FUROSEMIDE 40 MG PO TABS: 80.0000 mg | ORAL_TABLET | Freq: Two times a day (BID) | ORAL | 3 refills | Status: DC
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
## 2018-11-11 NOTE — Patient Instructions (Signed)
Lab work done today. We will notify you of any abnormal lab work.  Lab work will need to be done again in 10 days.  EKG done today.  INCREASE Furosemide 80mg  (2 tabs) two times daily  START Epleronone 25mg  (1 tab) daily  START Carvedilol 3.125mg  (1 tab) two times daily  Your physician recommends that you follow a low sodium diet. This means less than 2 grams of sodium in 1 day.   Please follow up with Dr. [name] in 3 weeks.

## 2018-11-14 NOTE — Progress Notes (Signed)
Dalton: I looked over his chart and remember him from my consult last year. I'll review his case with Cub this week. With you seeing him back closely in follow-up in April, I'm inclined to hold off until Covid restrictions are lifted. Let me know if things progress when you see him back and we can change plans as needed.   thx [name]

## 2018-11-17 ENCOUNTER — Ambulatory Visit (INDEPENDENT_AMBULATORY_CARE_PROVIDER_SITE_OTHER): Payer: Medicare Other

## 2018-11-17 ENCOUNTER — Other Ambulatory Visit: Payer: Self-pay

## 2018-11-17 ENCOUNTER — Telehealth: Payer: Self-pay | Admitting: Cardiology

## 2018-11-17 DIAGNOSIS — Z95810 Presence of automatic (implantable) cardiac defibrillator: Secondary | ICD-10-CM

## 2018-11-17 DIAGNOSIS — I5022 Chronic systolic (congestive) heart failure: Secondary | ICD-10-CM

## 2018-11-17 NOTE — Progress Notes (Signed)
EPIC Encounter for ICM Monitoring  Patient Name: Carl [name] is a 68 y.o. male Date: 11/17/2018 Primary Care Physican: [name], MD Primary [place] Electrophysiologist: Allred Last Weight:163lbs(11/11/2018 office visit) Today's Weight:in rehab center    Since 08-Nov-2018  AF 25 Time in AF 0.5 hr/day (2.0%)                                                    Call to brother. He reported patient c/o severe chest pain last night through this morning.  He was unable to sleep due to the pain. Patient told brother he notified the nurse of chest pain and was told the facility physician would need to evaluate him.  Brother said he called the facility last night and at 7 am this morning and was told he would get a call back today but he has not received a call.  Brother wants patient to go to [facility] ER if needed since his cardiologist are there.       Report: Thoracic impedance abnormal suggesting fluid accumulation since 08/30/2018 continues.  Device detects atrial fibrillation on remote transmission.  Device clinic RN reviewed current report (see note below) and will have it reviewed by [name].   Prescribed: Furosemide 40 mgtake1.5 tablets (60 mg total) twice a day.   Labs: 10/16/2018 Creatinine2.01, BUN47, Potassium4.5, Sodium134, [id number] 10/07/2018 Creatinine1.44, BUN22, Potassium4.6, [id number], JFH54-56 09/20/2018 Creatinine1.37, BUN23, Potassium3.8, Sodium135, GFR53->60 09/16/2018 Creatinine1.37, BUN17, Potassium2.7, Sodium136, GFR53->60  08/26/2018 Creatinine1.64, BUN20, Potassium3.5, Sodium136, YBW38-93  12/01/2019Creatinine 1.28, BUN29, Potassium3.5, Sodium137, GFR58->60  07/24/2018 Creatinine1.65, BUN38, Potassium3.8, Sodium134, TDS28-76  07/23/2018 Creatinine1.60, BUN37, Potassium3.4, Sodium134, OTL57-26  07/21/2018 Creatinine1.60, BUN44, Potassium3.2, Sodium136 (7:17 PM) 07/21/2018 Creatinine1.53, BUN42,  Potassium3.2, OMBTDH741, ULA45-36 (6:31 PM) A complete set of results can be found in Results Review  Recommendations: Advised brother/caregiver to call facility or go to the facility and inform them patient needs to be evaluated for chest pain and if necessary to call 911.  He said he would call after our call.  Follow-up plan: ICM clinic phone appointment on 11/23/2018 to recheck fluid levels. Office appointment with Dr [name] 12/02/2018.  Copy of ICM check sent to Dr. Rayann Heman and Dr [name] for review and recommendations if needed.    3 month ICM trend: 11/17/2018    1 Year ICM trend:       Rosalene Billings, RN 11/17/2018 2:38 PM

## 2018-11-17 NOTE — Progress Notes (Signed)
Asked to review "AF" episodes on patient's transmission by [name], RN. ECGs appear sinus rhythm with frequent ectopy, though difficult to confirm on some episodes. Longest episode 11min duration. Will route to Dr. Rayann Heman for review and recommendations (if any).

## 2018-11-17 NOTE — Telephone Encounter (Signed)
Patient's brother called in reporting he has been communicating with his brother via phone over the past 2 days. States his brother told him yesterday he was hurting in his chest yesterday. Currently lives in an assisted living facility. He has not been able to visit. Talked with him earlier this morning and stated he was still not feeling well. Called the facility but told they were in report. I advised the brother the patient needs to instruct the facility of his symptoms to determine whether he needs to be transferred to the ED for further eval. He understood and thanked me for the follow up call.

## 2018-11-22 NOTE — Progress Notes (Signed)
Carl Grayer, MD  Short, Laurie Panda, RN        Please see Reino Bellis phone note from 11/17/2018. We are unable to triage chest pain from ICD device clinic. If he is still having issues, needs to be evaluated by facility physician or ED.   Previous Messages

## 2018-11-23 ENCOUNTER — Other Ambulatory Visit: Payer: Self-pay

## 2018-11-23 ENCOUNTER — Ambulatory Visit (INDEPENDENT_AMBULATORY_CARE_PROVIDER_SITE_OTHER): Payer: Medicare Other

## 2018-11-23 DIAGNOSIS — Z95810 Presence of automatic (implantable) cardiac defibrillator: Secondary | ICD-10-CM

## 2018-11-23 DIAGNOSIS — I5022 Chronic systolic (congestive) heart failure: Secondary | ICD-10-CM

## 2018-11-24 NOTE — Progress Notes (Signed)
EPIC Encounter for ICM Monitoring  Patient Name: Carl [name] is a 68 y.o. male Date: 11/24/2018 Primary Care Physican: [name], MD Primary Cardiologist:[place] Electrophysiologist: Allred Last Weight:163lbs(11/11/2018 office visit)   Call to brother.  Patient has returned home from rehab today.  Paramedic, Steffanie Dunn will be setting up all his medications tomorrow since he got new prescriptions.      Report: Thoracic impedancereturned to normal.  Prescribed:Furosemide 40 mgtake1.5 tablets (60 mg total) twice a day.   Labs: 10/16/2018 Creatinine2.01, BUN47, Potassium4.5, Sodium134, [id number] 10/07/2018 Creatinine1.44, BUN22, Potassium4.6, SUORVI153, PHK32-76 09/20/2018 Creatinine1.37, BUN23, Potassium3.8, Sodium135, GFR53->60 09/16/2018 Creatinine1.37, BUN17, Potassium2.7, Sodium136, GFR53->60  08/26/2018 Creatinine1.64, BUN20, Potassium3.5, Sodium136, DYJ09-29  12/01/2019Creatinine 1.28, BUN29, Potassium3.5, Sodium137, GFR58->60  07/24/2018 Creatinine1.65, BUN38, Potassium3.8, Sodium134, VFM73-40  07/23/2018 Creatinine1.60, BUN37, Potassium3.4, Sodium134, ZJQ96-43  07/21/2018 Creatinine1.60, BUN44, Potassium3.2, Sodium136 (7:17 PM) 07/21/2018 Creatinine1.53, BUN42, Potassium3.2, CVKFMM037, VOH60-67 (6:31 PM) A complete set of results can be found in Results Review  Recommendations:   Follow-up plan: ICM clinic phone appointment on4/20/2020. Office appointment with Dr [name] 12/02/2018.  Copy of ICM check sent to Dr.Allred and Dr [name]  3 month ICM trend: 11/23/2018    1 Year ICM trend:       Rosalene Billings, RN 11/24/2018 7:00 PM

## 2018-11-25 ENCOUNTER — Telehealth (HOSPITAL_COMMUNITY): Payer: Self-pay | Admitting: Licensed Clinical Social Worker

## 2018-11-25 ENCOUNTER — Other Ambulatory Visit (HOSPITAL_COMMUNITY): Payer: Self-pay

## 2018-11-25 NOTE — Progress Notes (Signed)
With creatinine up, hold 1 dose of Lasix, then decrease Lasix to 40 bid x 2 days then 60 qam/40 qpm long-term.  BMET 10 days.

## 2018-11-25 NOTE — Telephone Encounter (Signed)
Patient identified as a candidate to receive 14 heart healthy meals per week for 3 months through [facility] partnership with [employer] program.   Completed referral sent in for review.  Anticipate patient will receive first shipment of food in 1-3 business days.  Amarie Viles H. Corene Resnick, LCSW Clinical Social Worker [facility] Desk#: [phone] Cell#: [phone]   

## 2018-11-25 NOTE — Progress Notes (Signed)
Today was to visit with [name] at home, he was discharged from [facility] yesterday.  [name] states he is feeling fine, he looks very tired and will not stay awake.  His brother is there with him.  Took med list from epic and placed his medication in med boxes for him.  Rehab facility did not send a list of meds home with him.  [name] has not been up long, he has not eat or took any meds since yesterday morning.  [name] his sister brought him a ham biscuit and large ice tea.  Explained to both sibling and to [name] that he needs to watch his sodium enriched foods.  He does want the Moms meals and have sent that in for him.  [name] is aware of how to take his meds out of box and when to take them.  He is in his old house now and in a new environment.  He is walking with walker and appears to be doing pretty good.  He has a service coming in that will take him to appts, he has PT that will be coming in and nursing that will start coming per [name] his brother.  Left a list of meds and went through them with [name].  [name] to get up every 2 hours and walk a little, dont lay around all day.  He denies chest pain, shortness of breath, headaches or dizziness.  Will continue to make home visits with [name] for Heart failure, med compliance and diet.   [name] [phone]

## 2018-11-26 MED ORDER — FUROSEMIDE 40 MG PO TABS: ORAL_TABLET | ORAL | Status: DC

## 2018-11-26 NOTE — Addendum Note (Signed)
Addended by: Rosalene Billings on: 11/26/2018 01:19 PM   Modules accepted: Orders

## 2018-11-26 NOTE — Progress Notes (Signed)
Call to brother.  Advised of Dr Oleh Genin orders to hold the afternoon dose today then take Lasix to 40 bid x 2 days (4/4 & 4/5).  On Monday, 4/6 take 60 qam/40 qpm long-term.  Advised to make sure he is looking at the strength of Lasix tablets so patient gets the correct dosage.  He verbalized understanding and repeated instructions back correctly.  BMET ordered for 10 days and scheduled on 12/06/2018 and patient will have drawn at [place], [facility] since he lives in [place].  Next ICM remote transmission scheduled 12/07/2018 (manual send). Virtual office visit scheduled for 12/08/2018 with Dr [name].

## 2018-11-29 ENCOUNTER — Other Ambulatory Visit (HOSPITAL_COMMUNITY): Payer: Self-pay

## 2018-11-29 ENCOUNTER — Encounter (HOSPITAL_COMMUNITY): Payer: Self-pay

## 2018-11-29 ENCOUNTER — Telehealth (HOSPITAL_COMMUNITY): Payer: Self-pay | Admitting: *Deleted

## 2018-11-29 MED ORDER — FUROSEMIDE 40 MG PO TABS: 40.0000 mg | ORAL_TABLET | Freq: Two times a day (BID) | ORAL | Status: DC

## 2018-11-29 NOTE — Telephone Encounter (Signed)
Kristi called to clearify pt's Furosemide dosing.  She states when she did his pill box on Thur she did 40 mg BID as she thought that was his dose.  Prior to that he had been in the facility and she was unsure what he was taking as they did not send a med list when pt was d/c'd from them on Wed 4/1.  She states the pt's brother called her and told her he was contacted late last week about decreasing med so she was confused.  Upon further review of chart pt had been on Lasix 80 mg BID, had labs with elevated cr on 3/31 and Dr [name] advised to hold 1 dose, take 40 BID for 2 days then take 60 mg in AM and 40 mg in PM.  Kristi states pt seems dry to her, he does not weigh himself daily, no edema or sob, BP 102/80, HR 76 94% RA.  Discussed w/Dr [name], he states have pt hold 1 day of lasix then continue 40 mg BID and send another transmission and get labs.    Steffanie Dunn will adjust pill box, med list updated, pt already going to [facility] for labs later this week and is sending a transmission next weel.

## 2018-11-29 NOTE — Progress Notes (Signed)
Bp: 102/80, pulse: 76, resp: 18, spo2: 94 pt not weighing  Today visit was due to furosemide change.  After talking with Carl [name] wants Furosemide changed to 40 mg x2 daily.  Carl [name] appears very moody today, after talking with him he is constipated and hurting in butt he says.  He also says his back is hurting. Carl [name] his brother is picking up some stool softner.  Carl [name] since been home has been relying on his brother and sister to do everything for him.  Advised him he can not sit around all day, he wanted me to turn his coffee pot on and told him he needed to show me he can do it.  He stomped around the kitchen but he did it.  He has not been cooking his boiled eggs.  Explained to him his brother and sister will be going back to work and if he wants to stay alone he will need to start doing things for his self again.  He told me he did not remember how to boil egg, talked him through it and he finally peeled the eggs and started eating them.  His [employer]oms meals has been delivered and his sister is going to come over today to get him to start eating them and step him through it.  Meds were changed in med box.  It appears he is taking his medications.  He has no swelling in lower extremities, lungs are clear. Denies chest pain, shortness of breath, headaches or dizziness.  Will continue to visit at home for medication compliance, diet and heart failure.   [place] [phone]

## 2018-12-02 ENCOUNTER — Encounter (HOSPITAL_COMMUNITY): Payer: Medicare Other | Admitting: Cardiology

## 2018-12-07 ENCOUNTER — Other Ambulatory Visit: Payer: Self-pay

## 2018-12-07 ENCOUNTER — Ambulatory Visit (INDEPENDENT_AMBULATORY_CARE_PROVIDER_SITE_OTHER): Payer: Medicare Other

## 2018-12-07 ENCOUNTER — Telehealth: Payer: Self-pay

## 2018-12-07 DIAGNOSIS — Z95810 Presence of automatic (implantable) cardiac defibrillator: Secondary | ICD-10-CM | POA: Diagnosis not present

## 2018-12-07 DIAGNOSIS — I5022 Chronic systolic (congestive) heart failure: Secondary | ICD-10-CM

## 2018-12-07 NOTE — Telephone Encounter (Signed)
Spoke with brother and advised to have patient send remote transmission today so that Dr [name] can review for the telephone visit for tomorrow.  He asked if patient is supposed to have labs drawn and advised to ask either the [place] with the paramedicine program since she was the last one to discuss with Dr Claris Gladden office or ask Dr [name] tomorrow at phone visit.

## 2018-12-08 ENCOUNTER — Telehealth (HOSPITAL_COMMUNITY): Payer: Self-pay

## 2018-12-08 ENCOUNTER — Other Ambulatory Visit: Payer: Self-pay

## 2018-12-08 ENCOUNTER — Other Ambulatory Visit (HOSPITAL_COMMUNITY): Payer: Self-pay

## 2018-12-08 ENCOUNTER — Ambulatory Visit (HOSPITAL_COMMUNITY)
Admission: RE | Admit: 2018-12-08 | Discharge: 2018-12-08 | Disposition: A | Discharge status: Home / Self Care | Payer: Medicare Other | Source: Ambulatory Visit | Attending: Cardiology | Admitting: Cardiology

## 2018-12-08 ENCOUNTER — Encounter (HOSPITAL_COMMUNITY): Payer: Self-pay

## 2018-12-08 VITALS — BP 112/70 | HR 66 | Wt 155.2 lb

## 2018-12-08 DIAGNOSIS — I340 Nonrheumatic mitral (valve) insufficiency: Secondary | ICD-10-CM

## 2018-12-08 DIAGNOSIS — I519 Heart disease, unspecified: Secondary | ICD-10-CM

## 2018-12-08 MED ORDER — LOSARTAN POTASSIUM 25 MG PO TABS: 12.5000 mg | ORAL_TABLET | Freq: Every day | ORAL | 0 refills | Status: DC

## 2018-12-08 MED ORDER — FUROSEMIDE 40 MG PO TABS: ORAL_TABLET | ORAL | 0 refills | Status: DC

## 2018-12-08 NOTE — Patient Instructions (Addendum)
1. Increase Lasix to 80 mg every am and 40 mg every pm   2. Add back losartan 12.5 mg (0.5 tab) daily.   3. BMET in 7 days, scheduled for   4. Can we get him a new scale? His is broken. /on order  5. Need Mitraclip referral /ref placed w/ MD Cooper  6. Followup 2 wks telehealth, can be with APP. 4/28 @ 10:30

## 2018-12-08 NOTE — Telephone Encounter (Signed)
Call to brother [name] and advised remote transmission was not received.  Pt said he sent the report.  Brother is assisting with transmission while on the phone and it was received.

## 2018-12-08 NOTE — Progress Notes (Signed)
EPIC Encounter for ICM Monitoring  Patient Name: Carl [name] is a 68 y.o. male Date: 12/08/2018 Primary Care Physican: [name], MD Primary Cardiologist:[place] Electrophysiologist: Allred Last Weight:163lbs(11/11/2018 office visit)   Call to brother and transmission reviewed.  Patient has virtual visit with Dr [name] today, 12/08/2018.      Report: Thoracic impedanceabnormal suggesting fluid accumulation since 09/02/2018 with exception of 2 days at baseline.  Prescribed:Furosemide 40 mgtake1 tablet by mouth twice a day.  Labs: 11/11/2018 Creatinine 1.45, BUN 15, Potassium 3.6, Sodium 136, GFR 49-57 10/16/2018 Creatinine2.01, BUN47, Potassium4.5, ESLPNP005, RTM21-11 10/07/2018 Creatinine1.44, BUN22, Potassium4.6, NBVAPO141, CVU13-14 09/20/2018 Creatinine1.37, BUN23, Potassium3.8, Sodium135, GFR53->60 09/16/2018 Creatinine1.37, BUN17, Potassium2.7, Sodium136, GFR53->60  08/26/2018 Creatinine1.64, BUN20, Potassium3.5, Sodium136, HOO87-57  12/01/2019Creatinine 1.28, BUN29, Potassium3.5, VJKQAS601, GFR58->60  07/24/2018 Creatinine1.65, BUN38, Potassium3.8, Sodium134, VIF53-79  07/23/2018 Creatinine1.60, BUN37, Potassium3.4, Sodium134, KFE76-14  07/21/2018 Creatinine1.60, BUN44, Potassium3.2, Sodium136 (7:17 PM) 07/21/2018 Creatinine1.53, BUN42, Potassium3.2, JWLKHV747, BUY37-09 (6:31 PM) A complete set of results can be found in Results Review  Recommendations: Recommendations will be given at virtual office visit today, 12/08/2018, if needed.  Follow-up plan: ICM clinic phone appointment on4/20/2020 to recheck fluid levels. Virtual office appointment with Dr [name] 12/08/2018.  Copy of ICM check sent to Dr.Allred and Dr [name]  3 month ICM trend: 12/08/2018    1 Year ICM trend:       Rosalene Billings, RN 12/08/2018 12:33 PM

## 2018-12-08 NOTE — Progress Notes (Signed)
VS obtained by Steffanie Dunn, paramedic, entered into VS section of note

## 2018-12-08 NOTE — Telephone Encounter (Signed)
Contacted Esperanza Sheets brother to make sure he understood the medication change that was made by Dr Marigene Ehlers today in a telephone appt.  He was aware and also advised if I could contact [facility] with his Bp reading and weight.  Home health nurse was there so I forward her reading to him by [name] in triage at [facility] clinic.  Will visit Bernhard next Wednesday at home.  [place] [phone]

## 2018-12-08 NOTE — Progress Notes (Signed)
Today was a visit with Carl [name] to place his medications in his med boxes.  He appears to be getting around much better.  His lower extremities has tightness and swollen in ankle area.  Advised HF clinic, he has appt with [facility].  He denies shortness of breath, headaches, chest pain or dizziness.  He complains of his legs hurting today.  Bowel movements better.  He missed one night of medications this past week.  He states been eating good, he is not weighing, he looks like he is gaining weight. His scale is broke, [employer] is sending him another one.  Abdomen is round, non tender.  He states his belly does not hurt. He has PT coming out, occupational therapy and nursing.  He has been getting up earlier and cooking his boiled eggs for breakfast.  He has been eating the Moms meals, but his brother states he may eat 2 of them because he does not like something in one of them.  Discussed diet with him and drinking water.  Meds verified and med box filled up.  Lungs sounds clear.  Will continue to visit for heart failure, diet and medication compliance.  [place] [phone]

## 2018-12-08 NOTE — Telephone Encounter (Signed)
AVS reviewed:  1. Increase Lasix to 80 mg every am and 40 mg every pm   2. Add back losartan 12.5 mg (0.5 tab) daily.   3. BMET in 7 days, scheduled for   4. Can we get him a new scale? His is broken. /on order  5. Need Mitraclip referral /ref placed w/ MD Cooper  6. Followup 2 wks telehealth, can be with APP. 4/28 @ 10:30
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
## 2018-12-13 ENCOUNTER — Encounter: Payer: Self-pay | Admitting: Cardiovascular Disease

## 2018-12-13 ENCOUNTER — Other Ambulatory Visit: Payer: Self-pay

## 2018-12-13 ENCOUNTER — Ambulatory Visit (INDEPENDENT_AMBULATORY_CARE_PROVIDER_SITE_OTHER): Payer: Medicare Other

## 2018-12-13 ENCOUNTER — Telehealth (INDEPENDENT_AMBULATORY_CARE_PROVIDER_SITE_OTHER): Payer: Medicare Other | Admitting: Cardiovascular Disease

## 2018-12-13 VITALS — Ht 72.0 in | Wt 161.4 lb

## 2018-12-13 DIAGNOSIS — I340 Nonrheumatic mitral (valve) insufficiency: Secondary | ICD-10-CM | POA: Diagnosis not present

## 2018-12-13 DIAGNOSIS — Z95810 Presence of automatic (implantable) cardiac defibrillator: Secondary | ICD-10-CM

## 2018-12-13 DIAGNOSIS — I5022 Chronic systolic (congestive) heart failure: Secondary | ICD-10-CM

## 2018-12-13 NOTE — Progress Notes (Addendum)
Virtual Visit via Video Note   This visit type was conducted due to national recommendations for restrictions regarding the COVID-19 Pandemic (e.g. social distancing) in an effort to limit this patient's exposure and mitigate transmission in our community.  Due to his co-morbid illnesses, this patient is at least at moderate risk for complications without adequate follow up.  This format is felt to be most appropriate for this patient at this time.  All issues noted in this document were discussed and addressed.  A limited physical exam was performed with this format.  Please refer to the patient's chart for his consent to telehealth for [facility].   Evaluation Performed:  Follow-up visit  Date:  12/15/2018   ID:  Carl [name], DOB 12-11-1950, MRN [id number]  Patient Location: Home Provider Location: Home  PCP:  [name], MD  Cardiologist:  No primary care provider on file.  Electrophysiologist:  None   Chief Complaint: Shortness of breath  History of Present Illness:    Carl [name] is a 68 y.o. male with a history of severe mitral insufficiency.  He was seen in June 2019 for consideration of percutaneous mitral valve repair with chronic systolic heart failure and severe MR.  The patient initially presented in 2015 with congestive heart failure and non-STEMI.  He was found to have severe LV dysfunction with an LVEF of 15 to 25% and severe three-vessel coronary artery disease.  He underwent 5 vessel CABG with a LIMA to LAD, saphenous vein graft to diagonal, sequential saphenous vein graft to OM 2 and OM 3, and saphenous vein graft to PDA.  Left ventricular function did not improve much after surgery and he has subsequently undergone ICD implantation.  He has a history of moderate mitral regurgitation in the past, but on more recent echocardiogram studies he has been found to have severe mitral regurgitation.  He underwent a TEE in 2019 demonstrating severe mitral regurgitation  with annular dilatation and posterior leaflet restriction. I saw him in consultation in June 2019 for consideration of percutaneous mitral valve repair.  At that time he was noted to be on a maximally tolerated heart failure regimen with ongoing New York Heart Association functional class II symptoms of chronic systolic heart failure and severe mitral insufficiency.  We elected to continue with close follow-up in the setting of stable symptoms.  The patient has chronic problems with back and leg pain.  He has mostly ambulated with a walker over time after sustaining a severe leg injury many years ago.  The patient has mild mental retardation but has functioned independently.  His brother is present for today's video conference and he helps with communication.  The patient does admit to shortness of breath.  This is progressed over recent months and he now has New York Heart Association functional class III symptoms of shortness of breath with low-level activity.  He denies orthopnea or PND.  He denies chest pain or chest pressure.  He has had worsening leg swelling.  His thoracic impedance numbers have continued to worsen despite close outpatient care in the advanced heart failure clinic.  Oral diuretics have been adjusted.  The patient does not have symptoms concerning for COVID-19 infection (fever, chills, cough, or new shortness of breath).    Past Medical History:  Diagnosis Date   Anemia    Anxiety    Arthritis    Brunner's gland hyperplasia of duodenum    CHF (congestive heart failure) ([place])    Coronary  artery disease    External hemorrhoids    Fracture of femoral neck, left (HCC) 07/21/2018   GERD (gastroesophageal reflux disease)    Hearing loss    HH (hiatus hernia)    Internal hemorrhoids    Ischemic cardiomyopathy    Leg fracture, left    Myocardial infarction ([place])    Paronychia    Psoriasis    PUD (peptic ulcer disease)    Schatzki's ring    Tubular  adenoma of colon    Vitiligo    Past Surgical History:  Procedure Laterality Date   CARDIAC SURGERY     COLONOSCOPY WITH ESOPHAGOGASTRODUODENOSCOPY (EGD)     COLONOSCOPY WITH PROPOFOL N/A 08/24/2017   Procedure: COLONOSCOPY WITH PROPOFOL;  Surgeon: [name], MD;  Location: [facility] ENDOSCOPY;  Service: Endoscopy;  Laterality: N/A;   CORONARY ARTERY BYPASS GRAFT N/A 08/03/2014   Procedure: CORONARY ARTERY BYPASS GRAFTING (CABG);  Surgeon: [name], MD;  Location: [place];  Service: Open Heart Surgery;  Laterality: N/A;   EP IMPLANTABLE DEVICE N/A 05/01/2015   MDT ICD implanted for primary prevention of sudden death   ESOPHAGOGASTRODUODENOSCOPY (EGD) WITH PROPOFOL N/A 04/16/2015   Procedure: ESOPHAGOGASTRODUODENOSCOPY (EGD) WITH PROPOFOL;  Surgeon: [name], MD;  Location: [facility] ENDOSCOPY;  Service: Endoscopy;  Laterality: N/A;   ESOPHAGOGASTRODUODENOSCOPY (EGD) WITH PROPOFOL N/A 08/24/2017   Procedure: ESOPHAGOGASTRODUODENOSCOPY (EGD) WITH PROPOFOL;  Surgeon: [name], MD;  Location: [facility] ENDOSCOPY;  Service: Endoscopy;  Laterality: N/A;   FRACTURE SURGERY     HERNIA REPAIR     HIP PINNING,CANNULATED Left 07/22/2018   Procedure: CANNULATED HIP PINNING;  Surgeon: [name], MD;  Location: [place];  Service: Orthopedics;  Laterality: Left;   TEE WITHOUT CARDIOVERSION N/A 08/03/2014   Procedure: TRANSESOPHAGEAL ECHOCARDIOGRAM (TEE);  Surgeon: [name], MD;  Location: [place];  Service: Open Heart Surgery;  Laterality: N/A;   TEE WITHOUT CARDIOVERSION N/A 02/10/2018   Procedure: TRANSESOPHAGEAL ECHOCARDIOGRAM (TEE);  Surgeon: [name], MD;  Location: [facility] ENDOSCOPY;  Service: Cardiovascular;  Laterality: N/A;     Current Meds  Medication Sig   acetaminophen (TYLENOL) 325 MG tablet Take 325 mg by mouth every 6 (six) hours as needed for mild pain or moderate pain.    albuterol (PROVENTIL) (2.5 MG/3ML) 0.083% nebulizer solution Inhale into the  lungs.   Ascorbic Acid (VITAMIN C) 100 MG tablet Take 100 mg by mouth daily.   aspirin EC 81 MG tablet Take 1 tablet (81 mg total) by mouth daily.   atorvastatin (LIPITOR) 40 MG tablet Take 1 tablet (40 mg total) by mouth daily at 6 PM.   carvedilol (COREG) 3.125 MG tablet Take 1 tablet (3.125 mg total) by mouth 2 (two) times daily.   Cholecalciferol (VITAMIN D3 SUPER STRENGTH) 50 MCG (2000 UT) TABS Take by mouth.   citalopram (CELEXA) 20 MG tablet Take 20 mg by mouth daily.    cyanocobalamin 500 MCG tablet Take 500 mcg by mouth daily.   docusate sodium (COLACE) 100 MG capsule Take by mouth.   DR SMITHS DIAPER 10 % OINT APPLY TOPICALLY AS NEEDED FOR RASH   eplerenone (INSPRA) 25 MG tablet Take 1 tablet (25 mg total) by mouth daily.   ferrous sulfate 325 (65 FE) MG tablet Take 1 tablet (325 mg total) by mouth daily with breakfast.   furosemide (LASIX) 40 MG tablet Take 2 tablets (80 mg total) by mouth every morning AND 1 tablet (40 mg total) at bedtime.  losartan (COZAAR) 25 MG tablet Take 0.5 tablets (12.5 mg total) by mouth daily.   Multiple Vitamin (MULTIVITAMIN) tablet Take 1 tablet by mouth daily.   omeprazole (PRILOSEC) 20 MG capsule Take 20 mg by mouth daily.     Allergies:   Spironolactone   Social History   Tobacco Use   Smoking status: Former Smoker    Last attempt to quit: 2014    Years since quitting: 6.3   Smokeless tobacco: Never Used   Tobacco comment: 07/30/2017 Quit in 2014  Substance Use Topics   Alcohol use: No    Alcohol/week: 0.0 standard drinks   Drug use: No     Family Hx: The patient's family history includes CAD in his father; Diabetes in his brother; Heart Problems in his brother; Valvular heart disease in his mother. There is no history of Prostate cancer, Bladder Cancer, or Kidney cancer.  ROS:   Please see the history of present illness.    All other systems reviewed and are negative.   Prior CV studies:   The following  studies were reviewed today:  TEE 02-10-2018: Study Conclusions  - Left ventricle: Mildly dilated LV with diffuse hypokinesis and   inferior/inferolateral akinesis. EF 25-30%. - Aortic valve: There was no stenosis. There was trivial   regurgitation. - Aorta: Normal caliber aorta with mild plaque. - Mitral valve: There was restriction of the posterior mitral   leaflet with severe MR, ERO 0.55 cm^2. There was flattening of   the pulmonary vein systolic doppler signal. - Left atrium: The atrium was moderately dilated. - Right ventricle: The cavity size was mildly dilated. Pacer wire   or catheter noted in right ventricle. Systolic function was   mildly to moderately reduced. - Right atrium: The atrium was mildly dilated. - Atrial septum: No defect or patent foramen ovale was identified.   Echo contrast study showed no right-to-left atrial level shunt,   at baseline or with provocation. - Tricuspid valve: There was moderate regurgitation. Peak RV-RA   gradient (S): 23 mm Hg. - Pulmonic valve: No evidence of vegetation.  Impressions:  - Severe mitral regurgitation, suspect functional MR, likely   combination of ischemic MR + MR from dilated annulus.  Labs/Other Tests and Data Reviewed:    EKG:  An ECG dated November 11, 2018 was personally reviewed today and demonstrated:  Sinus rhythm with PACs, right bundle branch block, heart rate 88 bpm  Recent Labs: 07/21/2018: B Natriuretic Peptide 703.1 07/22/2018: Magnesium 1.8 10/16/2018: Hemoglobin 12.4; Platelets 138 11/11/2018: BUN 15; Creatinine, Ser 1.45; Potassium 3.6; Sodium 136   Recent Lipid Panel Lab Results  Component Value Date/Time   CHOL 103 11/11/2018 11:32 AM   TRIG 63 11/11/2018 11:32 AM   HDL 24 (L) 11/11/2018 11:32 AM   CHOLHDL 4.3 11/11/2018 11:32 AM   LDLCALC 66 11/11/2018 11:32 AM    Wt Readings from Last 3 Encounters:  12/13/18 161 lb 6.4 oz (73.2 kg)  12/08/18 155 lb 4 oz (70.4 kg)  11/11/18 163 lb 9.6 oz  (74.2 kg)     Objective:    Vital Signs:  Ht 6' (1.829 m)    Wt 161 lb 6.4 oz (73.2 kg)    BMI 21.89 kg/m    VITAL SIGNS:  reviewed  The patient is alert and oriented.  He is in no distress.  He is hard of hearing.  Communication is somewhat difficult via video conferencing.  STS Risk Calculator: Mitral valve repair: Risk of Mortality:  2.797%  Renal Failure:  3.821% Permanent Stroke:  1.306% Prolonged Ventilation:  12.773% DSW Infection:  0.056% Reoperation:  4.566% Morbidity or Mortality:  19.554% Short Length of Stay:  37.363% Long Length of Stay:  6.647%  Mitral valve replacement: Risk of Mortality:  4.450% Renal Failure:  5.356% Permanent Stroke:  0.881% Prolonged Ventilation:  17.403% DSW Infection:  0.107% Reoperation:  5.177% Morbidity or Mortality:  23.824% Short Length of Stay:  21.353% Long Length of Stay:  12.795%  ASSESSMENT & PLAN:    68 year old male with New York Heart Association functional class IIIb systolic heart failure with severe, stage D, mitral insufficiency.  The patient has progressive symptoms of congestive heart failure refractory to medical management and close follow-up with our advanced heart failure practice.  I have personally reviewed his TEE images from his study performed last year.  This demonstrates clear findings of severe mitral insufficiency likely related to restriction of the posterior mitral valve leaflet and mitral annular dilatation.  There is some leaflet thickening present as well.  There is concomitant severe tricuspid regurgitation. I have reviewed the natural history of mitral regurgitation with the patient and their family members who are present for the video conference today. We have discussed the limitations of medical therapy and the poor prognosis associated with symptomatic mitral regurgitation. We have also reviewed potential treatment options, including palliative medical therapy, conventional surgical  mitral valve repair or replacement, and percutaneous mitral valve therapies such as edge-to-edge mitral valve approximation with MitraClip. We discussed treatment options in the context of this patient's specific comorbid medical conditions.   They understand that the patient will require right and left heart catheterization before proceeding with any definitive therapy. I have reviewed the risks, indications, and alternatives to cardiac catheterization, possible angioplasty, and stenting with the patient. Risks include but are not limited to bleeding, infection, vascular injury, stroke, myocardial infection, arrhythmia, kidney injury, radiation-related injury in the case of prolonged fluoroscopy use, emergency cardiac surgery, and death. The patient understands the risks of serious complication is 1-2 in 5643 with diagnostic cardiac cath and 1-2% or less with angioplasty/stenting.   Following cardiac catheterization, will refer him for cardiac surgical evaluation as part of a multidisciplinary approach to his care.  Considering his comorbid conditions, poor functional capacity, prior CABG, and severe LV dysfunction, I suspect he will be a candidate for percutaneous mitral valve repair with MitraClip.  COVID-19 Education: The signs and symptoms of COVID-19 were discussed with the patient and how to seek care for testing (follow up with PCP or arrange E-visit).  The importance of social distancing was discussed today.  Time:   Today, I have spent 45 minutes with the patient with telehealth technology discussing the above problems.     Medication Adjustments/Labs and Tests Ordered: Current medicines are reviewed at length with the patient today.  Concerns regarding medicines are outlined above.   Tests Ordered: No orders of the defined types were placed in this encounter.   Medication Changes: No orders of the defined types were placed in this encounter.   Disposition:  Follow up As outlined  above  Signed, [name], MD  12/15/2018 2:46 PM    [facility]  Addendum: Pt seen/evaluated in procedural short stay. No new complaints. Has undergone R/L heart cath demonstrating patent bypass grafts. Creatinine elevated from baseline - will gently hydrate post-procedure and hold losartan/diuretic Rx. Percutaneous mitral valve repair has been discussed at length with the patient and his brother. Risks/indications/alternatives reviewed  in detail. All questions have been answered.  [name] 12/30/2018 7:28 AM

## 2018-12-13 NOTE — Progress Notes (Signed)
EPIC Encounter for ICM Monitoring  Patient Name: Carl [name] is a 68 y.o. male Date: 12/13/2018 Primary Care Physican: [name], MD Primary Cardiologist:[place] Electrophysiologist: Allred Last Weight:163lbs(11/11/2018 office visit) 12/13/2018 Weight: unknown   Spoke with brother.  Advised of increased fluid accumulation after the 4/15 phone visit with Dr [name].  Discussed patient's diet.  Patient's sister picks up take out food daily and brother unsure of what patient is eating at home.  Explained restaurant foods are very high in salt which may attribute to fluid accumulation.  Explained the importance of limiting salt and avoid take out foods if possible.    Report: Thoracic impedanceabnormal suggesting fluid accumulation worsening starting 12/07/2018 after Dr [name] increased Lasix back to 80 qam/40 qpm at 4/15 virtual visit.  Prescribed:Furosemide 40 mgtake2 tablets (80 mg total) every morning and 1 tablet (40 mg total) every evening (increased on 4/15).  Labs: 11/23/2018 Creatinine 1.75, BUN 40, Potassium 4.2, Sodium 137, GFR 39.32-45.57 11/11/2018 Creatinine 1.45, BUN 15, Potassium 3.6, Sodium 136, GFR 49-57 10/16/2018 Creatinine2.01, BUN47, Potassium4.5, Sodium134, [id number] 10/07/2018 Creatinine1.44, BUN22, Potassium4.6, [id number], [id number] 09/20/2018 Creatinine1.37, BUN23, Potassium3.8, Sodium135, GFR53->60 09/16/2018 Creatinine1.37, BUN17, Potassium2.7, Sodium136, GFR53->60  08/26/2018 Creatinine1.64, BUN20, Potassium3.5, Sodium136, [id number]  12/01/2019Creatinine 1.28, BUN29, Potassium3.5, [id number], GFR58->60  07/24/2018 Creatinine1.65, BUN38, Potassium3.8, Sodium134, [id number]  07/23/2018 Creatinine1.60, BUN37, Potassium3.4, Sodium134, [id number]  07/21/2018 Creatinine1.60, BUN44, Potassium3.2, Sodium136 (7:17 PM) 07/21/2018 Creatinine1.53, BUN42, Potassium3.2, [id number], [id number]  (6:31 PM) A complete set of results can be found in Results Review  Recommendations: Advised will send copy to Dr [name] for review and recommendations if needed.  Follow-up plan: ICM clinic phone appointment on4/27/2020 (manual send) to recheck fluid levels. Virtual office appointment with Dr Burt Knack today, 4/20 and with HF NP/PA on 12/21/2018.  Copy of ICM check sent to Dr.Allredand Dr [name].   3 month ICM trend: 12/13/2018    1 Year ICM trend:       Rosalene Billings, RN 12/13/2018 8:45 AM

## 2018-12-13 NOTE — H&P (View-Only) (Signed)
Virtual Visit via Video Note   This visit type was conducted due to national recommendations for restrictions regarding the COVID-19 Pandemic (e.g. social distancing) in an effort to limit this patient's exposure and mitigate transmission in our community.  Due to his co-morbid illnesses, this patient is at least at moderate risk for complications without adequate follow up.  This format is felt to be most appropriate for this patient at this time.  All issues noted in this document were discussed and addressed.  A limited physical exam was performed with this format.  Please refer to the patient's chart for his consent to telehealth for [facility].   Evaluation Performed:  Follow-up visit  Date:  12/15/2018   ID:  Carl [name], DOB Jan 19, 1951, MRN [id number]  Patient Location: Home Provider Location: Home  PCP:  [name], MD  Cardiologist:  No primary care provider on file.  Electrophysiologist:  None   Chief Complaint: Shortness of breath  History of Present Illness:    Carl [name] is a 68 y.o. male with a history of severe mitral insufficiency.  He was seen in June 2019 for consideration of percutaneous mitral valve repair with chronic systolic heart failure and severe MR.  The patient initially presented in 2015 with congestive heart failure and non-STEMI.  He was found to have severe LV dysfunction with an LVEF of 15 to 25% and severe three-vessel coronary artery disease.  He underwent 5 vessel CABG with a LIMA to LAD, saphenous vein graft to diagonal, sequential saphenous vein graft to OM 2 and OM 3, and saphenous vein graft to PDA.  Left ventricular function did not improve much after surgery and he has subsequently undergone ICD implantation.  He has a history of moderate mitral regurgitation in the past, but on more recent echocardiogram studies he has been found to have severe mitral regurgitation.  He underwent a TEE in 2019 demonstrating severe mitral regurgitation  with annular dilatation and posterior leaflet restriction. I saw him in consultation in June 2019 for consideration of percutaneous mitral valve repair.  At that time he was noted to be on a maximally tolerated heart failure regimen with ongoing New York Heart Association functional class II symptoms of chronic systolic heart failure and severe mitral insufficiency.  We elected to continue with close follow-up in the setting of stable symptoms.  The patient has chronic problems with back and leg pain.  He has mostly ambulated with a walker over time after sustaining a severe leg injury many years ago.  The patient has mild mental retardation but has functioned independently.  His brother is present for today's video conference and he helps with communication.  The patient does admit to shortness of breath.  This is progressed over recent months and he now has New York Heart Association functional class III symptoms of shortness of breath with low-level activity.  He denies orthopnea or PND.  He denies chest pain or chest pressure.  He has had worsening leg swelling.  His thoracic impedance numbers have continued to worsen despite close outpatient care in the advanced heart failure clinic.  Oral diuretics have been adjusted.  The patient does not have symptoms concerning for COVID-19 infection (fever, chills, cough, or new shortness of breath).    Past Medical History:  Diagnosis Date  . Anemia   . Anxiety   . Arthritis   . Brunner's gland hyperplasia of duodenum   . CHF (congestive heart failure) ([place])   . Coronary  artery disease   . External hemorrhoids   . Fracture of femoral neck, left ([place]) 07/21/2018  . GERD (gastroesophageal reflux disease)   . Hearing loss   . HH (hiatus hernia)   . Internal hemorrhoids   . Ischemic cardiomyopathy   . Leg fracture, left   . Myocardial infarction ([place])   . Paronychia   . Psoriasis   . PUD (peptic ulcer disease)   . Schatzki's ring   . Tubular  adenoma of colon   . Vitiligo    Past Surgical History:  Procedure Laterality Date  . CARDIAC SURGERY    . COLONOSCOPY WITH ESOPHAGOGASTRODUODENOSCOPY (EGD)    . COLONOSCOPY WITH PROPOFOL N/A 08/24/2017   Procedure: COLONOSCOPY WITH PROPOFOL;  Surgeon: [name], MD;  Location[facility] ENDOSCOPY;  Service: Endoscopy;  Laterality: N/A;  . CORONARY ARTERY BYPASS GRAFT N/A 08/03/2014   Procedure: CORONARY ARTERY BYPASS GRAFTING (CABG);  Surgeon: [name], MD;  Location: [place];  Service: Open Heart Surgery;  Laterality: N/A;  . EP IMPLANTABLE DEVICE N/A 05/01/2015   MDT ICD implanted for primary prevention of sudden death  . ESOPHAGOGASTRODUODENOSCOPY (EGD) WITH PROPOFOL N/A 04/16/2015   Procedure: ESOPHAGOGASTRODUODENOSCOPY (EGD) WITH PROPOFOL;  Surgeon: [name], MD;  Location: [facility] ENDOSCOPY;  Service: Endoscopy;  Laterality: N/A;  . ESOPHAGOGASTRODUODENOSCOPY (EGD) WITH PROPOFOL N/A 08/24/2017   Procedure: ESOPHAGOGASTRODUODENOSCOPY (EGD) WITH PROPOFOL;  Surgeon: [name], MD;  Location: [facility] ENDOSCOPY;  Service: Endoscopy;  Laterality: N/A;  . FRACTURE SURGERY    . HERNIA REPAIR    . HIP PINNING,CANNULATED Left 07/22/2018   Procedure: CANNULATED HIP PINNING;  Surgeon: [name], MD;  Location: [place];  Service: Orthopedics;  Laterality: Left;  . TEE WITHOUT CARDIOVERSION N/A 08/03/2014   Procedure: TRANSESOPHAGEAL ECHOCARDIOGRAM (TEE);  Surgeon: [name], MD;  Location: [place];  Service: Open Heart Surgery;  Laterality: N/A;  . TEE WITHOUT CARDIOVERSION N/A 02/10/2018   Procedure: TRANSESOPHAGEAL ECHOCARDIOGRAM (TEE);  Surgeon: [name], MD;  Location: [facility] ENDOSCOPY;  Service: Cardiovascular;  Laterality: N/A;     Current Meds  Medication Sig  . acetaminophen (TYLENOL) 325 MG tablet Take 325 mg by mouth every 6 (six) hours as needed for mild pain or moderate pain.   Marland Kitchen albuterol (PROVENTIL) (2.5 MG/3ML) 0.083% nebulizer solution Inhale into the  lungs.  . Ascorbic Acid (VITAMIN C) 100 MG tablet Take 100 mg by mouth daily.  Marland Kitchen aspirin EC 81 MG tablet Take 1 tablet (81 mg total) by mouth daily.  Marland Kitchen atorvastatin (LIPITOR) 40 MG tablet Take 1 tablet (40 mg total) by mouth daily at 6 PM.  . carvedilol (COREG) 3.125 MG tablet Take 1 tablet (3.125 mg total) by mouth 2 (two) times daily.  . Cholecalciferol (VITAMIN D3 SUPER STRENGTH) 50 MCG (2000 UT) TABS Take by mouth.  . citalopram (CELEXA) 20 MG tablet Take 20 mg by mouth daily.   . cyanocobalamin 500 MCG tablet Take 500 mcg by mouth daily.  Marland Kitchen docusate sodium (COLACE) 100 MG capsule Take by mouth.  . DR SMITHS DIAPER 10 % OINT APPLY TOPICALLY AS NEEDED FOR RASH  . eplerenone (INSPRA) 25 MG tablet Take 1 tablet (25 mg total) by mouth daily.  . ferrous sulfate 325 (65 FE) MG tablet Take 1 tablet (325 mg total) by mouth daily with breakfast.  . furosemide (LASIX) 40 MG tablet Take 2 tablets (80 mg total) by mouth every morning AND 1 tablet (40 mg total) at bedtime.  Marland Kitchen  losartan (COZAAR) 25 MG tablet Take 0.5 tablets (12.5 mg total) by mouth daily.  . Multiple Vitamin (MULTIVITAMIN) tablet Take 1 tablet by mouth daily.  Marland Kitchen omeprazole (PRILOSEC) 20 MG capsule Take 20 mg by mouth daily.     Allergies:   Spironolactone   Social History   Tobacco Use  . Smoking status: Former Smoker    Last attempt to quit: 2014    Years since quitting: 6.3  . Smokeless tobacco: Never Used  . Tobacco comment: 07/30/2017 Quit in 2014  Substance Use Topics  . Alcohol use: No    Alcohol/week: 0.0 standard drinks  . Drug use: No     Family Hx: The patient's family history includes CAD in his father; Diabetes in his brother; Heart Problems in his brother; Valvular heart disease in his mother. There is no history of Prostate cancer, Bladder Cancer, or Kidney cancer.  ROS:   Please see the history of present illness.    All other systems reviewed and are negative.   Prior CV studies:   The following  studies were reviewed today:  TEE 02-10-2018: Study Conclusions  - Left ventricle: Mildly dilated LV with diffuse hypokinesis and   inferior/inferolateral akinesis. EF 25-30%. - Aortic valve: There was no stenosis. There was trivial   regurgitation. - Aorta: Normal caliber aorta with mild plaque. - Mitral valve: There was restriction of the posterior mitral   leaflet with severe MR, ERO 0.55 cm^2. There was flattening of   the pulmonary vein systolic doppler signal. - Left atrium: The atrium was moderately dilated. - Right ventricle: The cavity size was mildly dilated. Pacer wire   or catheter noted in right ventricle. Systolic function was   mildly to moderately reduced. - Right atrium: The atrium was mildly dilated. - Atrial septum: No defect or patent foramen ovale was identified.   Echo contrast study showed no right-to-left atrial level shunt,   at baseline or with provocation. - Tricuspid valve: There was moderate regurgitation. Peak RV-RA   gradient (S): 23 mm Hg. - Pulmonic valve: No evidence of vegetation.  Impressions:  - Severe mitral regurgitation, suspect functional MR, likely   combination of ischemic MR + MR from dilated annulus.  Labs/Other Tests and Data Reviewed:    EKG:  An ECG dated November 11, 2018 was personally reviewed today and demonstrated:  Sinus rhythm with PACs, right bundle branch block, heart rate 88 bpm  Recent Labs: 07/21/2018: B Natriuretic Peptide 703.1 07/22/2018: Magnesium 1.8 10/16/2018: Hemoglobin 12.4; Platelets 138 11/11/2018: BUN 15; Creatinine, Ser 1.45; Potassium 3.6; Sodium 136   Recent Lipid Panel Lab Results  Component Value Date/Time   CHOL 103 11/11/2018 11:32 AM   TRIG 63 11/11/2018 11:32 AM   HDL 24 (L) 11/11/2018 11:32 AM   CHOLHDL 4.3 11/11/2018 11:32 AM   LDLCALC 66 11/11/2018 11:32 AM    Wt Readings from Last 3 Encounters:  12/13/18 161 lb 6.4 oz (73.2 kg)  12/08/18 155 lb 4 oz (70.4 kg)  11/11/18 163 lb 9.6 oz  (74.2 kg)     Objective:    Vital Signs:  Ht 6' (1.829 m)   Wt 161 lb 6.4 oz (73.2 kg)   BMI 21.89 kg/m    VITAL SIGNS:  reviewed  The patient is alert and oriented.  He is in no distress.  He is hard of hearing.  Communication is somewhat difficult via video conferencing.  STS Risk Calculator: Mitral valve repair: Risk of Mortality:  2.797% Renal Failure:  3.821% Permanent Stroke:  1.306% Prolonged Ventilation:  12.773% DSW Infection:  0.056% Reoperation:  4.566% Morbidity or Mortality:  19.554% Short Length of Stay:  37.363% Long Length of Stay:  6.647%  Mitral valve replacement: Risk of Mortality:  4.450% Renal Failure:  5.356% Permanent Stroke:  0.881% Prolonged Ventilation:  17.403% DSW Infection:  0.107% Reoperation:  5.177% Morbidity or Mortality:  23.824% Short Length of Stay:  21.353% Long Length of Stay:  12.795%  ASSESSMENT & PLAN:    68 year old male with New York Heart Association functional class IIIb systolic heart failure with severe, stage D, mitral insufficiency.  The patient has progressive symptoms of congestive heart failure refractory to medical management and close follow-up with our advanced heart failure practice.  I have personally reviewed his TEE images from his study performed last year.  This demonstrates clear findings of severe mitral insufficiency likely related to restriction of the posterior mitral valve leaflet and mitral annular dilatation.  There is some leaflet thickening present as well.  There is concomitant severe tricuspid regurgitation. I have reviewed the natural history of mitral regurgitation with the patient and their family members who are present for the video conference today. We have discussed the limitations of medical therapy and the poor prognosis associated with symptomatic mitral regurgitation. We have also reviewed potential treatment options, including palliative medical therapy, conventional surgical  mitral valve repair or replacement, and percutaneous mitral valve therapies such as edge-to-edge mitral valve approximation with MitraClip. We discussed treatment options in the context of this patient's specific comorbid medical conditions.   They understand that the patient will require right and left heart catheterization before proceeding with any definitive therapy. I have reviewed the risks, indications, and alternatives to cardiac catheterization, possible angioplasty, and stenting with the patient. Risks include but are not limited to bleeding, infection, vascular injury, stroke, myocardial infection, arrhythmia, kidney injury, radiation-related injury in the case of prolonged fluoroscopy use, emergency cardiac surgery, and death. The patient understands the risks of serious complication is 1-2 in 4854 with diagnostic cardiac cath and 1-2% or less with angioplasty/stenting.   Following cardiac catheterization, will refer him for cardiac surgical evaluation as part of a multidisciplinary approach to his care.  Considering his comorbid conditions, poor functional capacity, prior CABG, and severe LV dysfunction, I suspect he will be a candidate for percutaneous mitral valve repair with MitraClip.  COVID-19 Education: The signs and symptoms of COVID-19 were discussed with the patient and how to seek care for testing (follow up with PCP or arrange E-visit).  The importance of social distancing was discussed today.  Time:   Today, I have spent 45 minutes with the patient with telehealth technology discussing the above problems.     Medication Adjustments/Labs and Tests Ordered: Current medicines are reviewed at length with the patient today.  Concerns regarding medicines are outlined above.   Tests Ordered: No orders of the defined types were placed in this encounter.   Medication Changes: No orders of the defined types were placed in this encounter.   Disposition:  Follow up As outlined  above  Signed, [name], MD  12/15/2018 2:46 PM    [place]

## 2018-12-13 NOTE — H&P (View-Only) (Signed)
Virtual Visit via Video Note   This visit type was conducted due to national recommendations for restrictions regarding the COVID-19 Pandemic (e.g. social distancing) in an effort to limit this patient's exposure and mitigate transmission in our community.  Due to his co-morbid illnesses, this patient is at least at moderate risk for complications without adequate follow up.  This format is felt to be most appropriate for this patient at this time.  All issues noted in this document were discussed and addressed.  A limited physical exam was performed with this format.  Please refer to the patient's chart for his consent to telehealth for [facility].   Evaluation Performed:  Follow-up visit  Date:  12/15/2018   ID:  Carl [name], DOB 01/17/1951, MRN [id number]  Patient Location: Home Provider Location: Home  PCP:  [name], MD  Cardiologist:  No primary care provider on file.  Electrophysiologist:  None   Chief Complaint: Shortness of breath  History of Present Illness:    Carl [name] is a 68 y.o. male with a history of severe mitral insufficiency.  He was seen in June 2019 for consideration of percutaneous mitral valve repair with chronic systolic heart failure and severe MR.  The patient initially presented in 2015 with congestive heart failure and non-STEMI.  He was found to have severe LV dysfunction with an LVEF of 15 to 25% and severe three-vessel coronary artery disease.  He underwent 5 vessel CABG with a LIMA to LAD, saphenous vein graft to diagonal, sequential saphenous vein graft to OM 2 and OM 3, and saphenous vein graft to PDA.  Left ventricular function did not improve much after surgery and he has subsequently undergone ICD implantation.  He has a history of moderate mitral regurgitation in the past, but on more recent echocardiogram studies he has been found to have severe mitral regurgitation.  He underwent a TEE in 2019 demonstrating severe mitral regurgitation  with annular dilatation and posterior leaflet restriction. I saw him in consultation in June 2019 for consideration of percutaneous mitral valve repair.  At that time he was noted to be on a maximally tolerated heart failure regimen with ongoing New York Heart Association functional class II symptoms of chronic systolic heart failure and severe mitral insufficiency.  We elected to continue with close follow-up in the setting of stable symptoms.  The patient has chronic problems with back and leg pain.  He has mostly ambulated with a walker over time after sustaining a severe leg injury many years ago.  The patient has mild mental retardation but has functioned independently.  His brother is present for today's video conference and he helps with communication.  The patient does admit to shortness of breath.  This is progressed over recent months and he now has New York Heart Association functional class III symptoms of shortness of breath with low-level activity.  He denies orthopnea or PND.  He denies chest pain or chest pressure.  He has had worsening leg swelling.  His thoracic impedance numbers have continued to worsen despite close outpatient care in the advanced heart failure clinic.  Oral diuretics have been adjusted.  The patient does not have symptoms concerning for COVID-19 infection (fever, chills, cough, or new shortness of breath).    Past Medical History:  Diagnosis Date  . Anemia   . Anxiety   . Arthritis   . Brunner's gland hyperplasia of duodenum   . CHF (congestive heart failure) ([place])   . Coronary  artery disease   . External hemorrhoids   . Fracture of femoral neck, left ([place]) 07/21/2018  . GERD (gastroesophageal reflux disease)   . Hearing loss   . HH (hiatus hernia)   . Internal hemorrhoids   . Ischemic cardiomyopathy   . Leg fracture, left   . Myocardial infarction ([place])   . Paronychia   . Psoriasis   . PUD (peptic ulcer disease)   . Schatzki's ring   . Tubular  adenoma of colon   . Vitiligo    Past Surgical History:  Procedure Laterality Date  . CARDIAC SURGERY    . COLONOSCOPY WITH ESOPHAGOGASTRODUODENOSCOPY (EGD)    . COLONOSCOPY WITH PROPOFOL N/A 08/24/2017   Procedure: COLONOSCOPY WITH PROPOFOL;  Surgeon: [name], MD;  Location: [facility] ENDOSCOPY;  Service: Endoscopy;  Laterality: N/A;  . CORONARY ARTERY BYPASS GRAFT N/A 08/03/2014   Procedure: CORONARY ARTERY BYPASS GRAFTING (CABG);  Surgeon: [name], MD;  Location: [place];  Service: Open Heart Surgery;  Laterality: N/A;  . EP IMPLANTABLE DEVICE N/A 05/01/2015   MDT ICD implanted for primary prevention of sudden death  . ESOPHAGOGASTRODUODENOSCOPY (EGD) WITH PROPOFOL N/A 04/16/2015   Procedure: ESOPHAGOGASTRODUODENOSCOPY (EGD) WITH PROPOFOL;  Surgeon: [name], MD;  Location: [facility] ENDOSCOPY;  Service: Endoscopy;  Laterality: N/A;  . ESOPHAGOGASTRODUODENOSCOPY (EGD) WITH PROPOFOL N/A 08/24/2017   Procedure: ESOPHAGOGASTRODUODENOSCOPY (EGD) WITH PROPOFOL;  Surgeon: [name], MD;  Location: [facility] ENDOSCOPY;  Service: Endoscopy;  Laterality: N/A;  . FRACTURE SURGERY    . HERNIA REPAIR    . HIP PINNING,CANNULATED Left 07/22/2018   Procedure: CANNULATED HIP PINNING;  Surgeon: [name], MD;  Location: [place];  Service: Orthopedics;  Laterality: Left;  . TEE WITHOUT CARDIOVERSION N/A 08/03/2014   Procedure: TRANSESOPHAGEAL ECHOCARDIOGRAM (TEE);  Surgeon: [name], MD;  Location: [place];  Service: Open Heart Surgery;  Laterality: N/A;  . TEE WITHOUT CARDIOVERSION N/A 02/10/2018   Procedure: TRANSESOPHAGEAL ECHOCARDIOGRAM (TEE);  Surgeon: [name], MD;  Location: [facility] ENDOSCOPY;  Service: Cardiovascular;  Laterality: N/A;     Current Meds  Medication Sig  . acetaminophen (TYLENOL) 325 MG tablet Take 325 mg by mouth every 6 (six) hours as needed for mild pain or moderate pain.   Marland Kitchen albuterol (PROVENTIL) (2.5 MG/3ML) 0.083% nebulizer solution Inhale into the  lungs.  . Ascorbic Acid (VITAMIN C) 100 MG tablet Take 100 mg by mouth daily.  Marland Kitchen aspirin EC 81 MG tablet Take 1 tablet (81 mg total) by mouth daily.  Marland Kitchen atorvastatin (LIPITOR) 40 MG tablet Take 1 tablet (40 mg total) by mouth daily at 6 PM.  . carvedilol (COREG) 3.125 MG tablet Take 1 tablet (3.125 mg total) by mouth 2 (two) times daily.  . Cholecalciferol (VITAMIN D3 SUPER STRENGTH) 50 MCG (2000 UT) TABS Take by mouth.  . citalopram (CELEXA) 20 MG tablet Take 20 mg by mouth daily.   . cyanocobalamin 500 MCG tablet Take 500 mcg by mouth daily.  Marland Kitchen docusate sodium (COLACE) 100 MG capsule Take by mouth.  . DR SMITHS DIAPER 10 % OINT APPLY TOPICALLY AS NEEDED FOR RASH  . eplerenone (INSPRA) 25 MG tablet Take 1 tablet (25 mg total) by mouth daily.  . ferrous sulfate 325 (65 FE) MG tablet Take 1 tablet (325 mg total) by mouth daily with breakfast.  . furosemide (LASIX) 40 MG tablet Take 2 tablets (80 mg total) by mouth every morning AND 1 tablet (40 mg total) at bedtime.  Marland Kitchen  losartan (COZAAR) 25 MG tablet Take 0.5 tablets (12.5 mg total) by mouth daily.  . Multiple Vitamin (MULTIVITAMIN) tablet Take 1 tablet by mouth daily.  Marland Kitchen omeprazole (PRILOSEC) 20 MG capsule Take 20 mg by mouth daily.     Allergies:   Spironolactone   Social History   Tobacco Use  . Smoking status: Former Smoker    Last attempt to quit: 2014    Years since quitting: 6.3  . Smokeless tobacco: Never Used  . Tobacco comment: 07/30/2017 Quit in 2014  Substance Use Topics  . Alcohol use: No    Alcohol/week: 0.0 standard drinks  . Drug use: No     Family Hx: The patient's family history includes CAD in his father; Diabetes in his brother; Heart Problems in his brother; Valvular heart disease in his mother. There is no history of Prostate cancer, Bladder Cancer, or Kidney cancer.  ROS:   Please see the history of present illness.    All other systems reviewed and are negative.   Prior CV studies:   The following  studies were reviewed today:  TEE 02-10-2018: Study Conclusions  - Left ventricle: Mildly dilated LV with diffuse hypokinesis and   inferior/inferolateral akinesis. EF 25-30%. - Aortic valve: There was no stenosis. There was trivial   regurgitation. - Aorta: Normal caliber aorta with mild plaque. - Mitral valve: There was restriction of the posterior mitral   leaflet with severe MR, ERO 0.55 cm^2. There was flattening of   the pulmonary vein systolic doppler signal. - Left atrium: The atrium was moderately dilated. - Right ventricle: The cavity size was mildly dilated. Pacer wire   or catheter noted in right ventricle. Systolic function was   mildly to moderately reduced. - Right atrium: The atrium was mildly dilated. - Atrial septum: No defect or patent foramen ovale was identified.   Echo contrast study showed no right-to-left atrial level shunt,   at baseline or with provocation. - Tricuspid valve: There was moderate regurgitation. Peak RV-RA   gradient (S): 23 mm Hg. - Pulmonic valve: No evidence of vegetation.  Impressions:  - Severe mitral regurgitation, suspect functional MR, likely   combination of ischemic MR + MR from dilated annulus.  Labs/Other Tests and Data Reviewed:    EKG:  An ECG dated November 11, 2018 was personally reviewed today and demonstrated:  Sinus rhythm with PACs, right bundle branch block, heart rate 88 bpm  Recent Labs: 07/21/2018: B Natriuretic Peptide 703.1 07/22/2018: Magnesium 1.8 10/16/2018: Hemoglobin 12.4; Platelets 138 11/11/2018: BUN 15; Creatinine, Ser 1.45; Potassium 3.6; Sodium 136   Recent Lipid Panel Lab Results  Component Value Date/Time   CHOL 103 11/11/2018 11:32 AM   TRIG 63 11/11/2018 11:32 AM   HDL 24 (L) 11/11/2018 11:32 AM   CHOLHDL 4.3 11/11/2018 11:32 AM   LDLCALC 66 11/11/2018 11:32 AM    Wt Readings from Last 3 Encounters:  12/13/18 161 lb 6.4 oz (73.2 kg)  12/08/18 155 lb 4 oz (70.4 kg)  11/11/18 163 lb 9.6 oz  (74.2 kg)     Objective:    Vital Signs:  Ht 6' (1.829 m)   Wt 161 lb 6.4 oz (73.2 kg)   BMI 21.89 kg/m    VITAL SIGNS:  reviewed  The patient is alert and oriented.  He is in no distress.  He is hard of hearing.  Communication is somewhat difficult via video conferencing.  STS Risk Calculator: Mitral valve repair: Risk of Mortality:  2.797% Renal Failure:  3.821% Permanent Stroke:  1.306% Prolonged Ventilation:  12.773% DSW Infection:  0.056% Reoperation:  4.566% Morbidity or Mortality:  19.554% Short Length of Stay:  37.363% Long Length of Stay:  6.647%  Mitral valve replacement: Risk of Mortality:  4.450% Renal Failure:  5.356% Permanent Stroke:  0.881% Prolonged Ventilation:  17.403% DSW Infection:  0.107% Reoperation:  5.177% Morbidity or Mortality:  23.824% Short Length of Stay:  21.353% Long Length of Stay:  12.795%  ASSESSMENT & PLAN:    68 year old male with New York Heart Association functional class IIIb systolic heart failure with severe, stage D, mitral insufficiency.  The patient has progressive symptoms of congestive heart failure refractory to medical management and close follow-up with our advanced heart failure practice.  I have personally reviewed his TEE images from his study performed last year.  This demonstrates clear findings of severe mitral insufficiency likely related to restriction of the posterior mitral valve leaflet and mitral annular dilatation.  There is some leaflet thickening present as well.  There is concomitant severe tricuspid regurgitation. I have reviewed the natural history of mitral regurgitation with the patient and their family members who are present for the video conference today. We have discussed the limitations of medical therapy and the poor prognosis associated with symptomatic mitral regurgitation. We have also reviewed potential treatment options, including palliative medical therapy, conventional surgical  mitral valve repair or replacement, and percutaneous mitral valve therapies such as edge-to-edge mitral valve approximation with MitraClip. We discussed treatment options in the context of this patient's specific comorbid medical conditions.   They understand that the patient will require right and left heart catheterization before proceeding with any definitive therapy. I have reviewed the risks, indications, and alternatives to cardiac catheterization, possible angioplasty, and stenting with the patient. Risks include but are not limited to bleeding, infection, vascular injury, stroke, myocardial infection, arrhythmia, kidney injury, radiation-related injury in the case of prolonged fluoroscopy use, emergency cardiac surgery, and death. The patient understands the risks of serious complication is 1-2 in 9798 with diagnostic cardiac cath and 1-2% or less with angioplasty/stenting.   Following cardiac catheterization, will refer him for cardiac surgical evaluation as part of a multidisciplinary approach to his care.  Considering his comorbid conditions, poor functional capacity, prior CABG, and severe LV dysfunction, I suspect he will be a candidate for percutaneous mitral valve repair with MitraClip.  COVID-19 Education: The signs and symptoms of COVID-19 were discussed with the patient and how to seek care for testing (follow up with PCP or arrange E-visit).  The importance of social distancing was discussed today.  Time:   Today, I have spent 45 minutes with the patient with telehealth technology discussing the above problems.     Medication Adjustments/Labs and Tests Ordered: Current medicines are reviewed at length with the patient today.  Concerns regarding medicines are outlined above.   Tests Ordered: No orders of the defined types were placed in this encounter.   Medication Changes: No orders of the defined types were placed in this encounter.   Disposition:  Follow up As outlined  above  Signed, [name], MD  12/15/2018 2:46 PM    [facility]  Addendum: Pt seen/evaluated in procedural short stay. No new complaints. Has undergone R/L heart cath demonstrating patent bypass grafts. Creatinine elevated from baseline - will gently hydrate post-procedure and hold losartan/diuretic Rx. Percutaneous mitral valve repair has been discussed at length with the patient and his brother. Risks/indications/alternatives reviewed in detail. All  questions have been answered.  [name] 12/30/2018 7:28 AM

## 2018-12-14 ENCOUNTER — Telehealth (HOSPITAL_COMMUNITY): Payer: Self-pay

## 2018-12-14 NOTE — Telephone Encounter (Signed)
Was contacted by Levonne Spiller brother and he advised that while home health was there yesterday [name] blood pressure was in the morning in the 70's then another one took it and it was in the 90's.  They contacted Dr Ginette Pitman and they advised to stop to Losartan that was restarted the week before.  Will visit at home this week and check vitals and refill med boxes.  [name] was pulling the losartan out.   [place] [phone]

## 2018-12-15 ENCOUNTER — Other Ambulatory Visit (HOSPITAL_COMMUNITY): Payer: Self-pay

## 2018-12-15 ENCOUNTER — Other Ambulatory Visit (HOSPITAL_COMMUNITY): Payer: Medicare Other

## 2018-12-15 ENCOUNTER — Telehealth: Payer: Self-pay

## 2018-12-15 ENCOUNTER — Telehealth (HOSPITAL_COMMUNITY): Payer: Self-pay | Admitting: *Deleted

## 2018-12-15 MED ORDER — FUROSEMIDE 40 MG PO TABS: 80.0000 mg | ORAL_TABLET | Freq: Two times a day (BID) | ORAL | 0 refills | Status: DC

## 2018-12-15 NOTE — Progress Notes (Signed)
Today was a visit with Carl [name] at home.  He states he is doing ok.  He appears to be taking his medications, his medication box are empty.  He denies chest pain, shortness of breath, headaches or dizziness.  He states home health keeps telling him his BP is too low.  He is not drinking no water, some soda.  Explained importance of water and how it effects the body.  Contacted HF clinic and they spoke to Carl [name] and advised he has some swelling in ankle area and his bp today.  Carl [name] gave order for 80 mg twice daily and 1 metolazone tomorrow morning.  Verified his medications and medication boxes were refilled.  He states been eating the Moms meals but his brother Carl [name] states he has been snacking on high sodium foods.  Discussed that with Carl [name] also.  He appears to be getting more active and getting familiar with being at home. Will continue to visit at home for Heart failure, diet and medication compliance.   [place] [phone]

## 2018-12-15 NOTE — Telephone Encounter (Signed)
Per Dr. Burt Knack, scheduled the patient for [facility] Tuesday, 4/28. Reviewed instructions with the patient's brother and released them to Donald.  He understands only he will be permitted with the patient back to Short Stay (spoke with Barbaraann Rondo in the cath lab who said an exception can be made in this case due to the patient's cognitive state). He was grateful for assistance.   Dr. Burt Knack states he increased the patient's Lasix to 80 mg BID at visit. Per Dr. Burt Knack, instructed the patient to hold Lasix the night before and morning of his cath.

## 2018-12-15 NOTE — Telephone Encounter (Signed)
Carl [name] called to report pt had edema in ankles and his weight is up. Pt was 154lbs 8 days ago today he is 161lbs. Also pts PCP discontinued Losartan because eof low bp. Pts bp today is 94/64. Per Dr.McLean stay off losartan and increase Lasix to 80mg  bid and take one dose of metolazone.  Steffanie Dunn is aware of plan.

## 2018-12-17 NOTE — Progress Notes (Signed)
Per 12/15/2018 Epic phone note.  Dr [name] increased Lasix to 80mg  bid and take one dose of metolazone due to weight gain and ankle swelling that was reported by the paramedicine, Kyle.  Recheck remote transmission on 12/20/2018.

## 2018-12-20 ENCOUNTER — Telehealth: Payer: Self-pay

## 2018-12-20 ENCOUNTER — Telehealth: Payer: Self-pay | Admitting: *Deleted

## 2018-12-20 ENCOUNTER — Other Ambulatory Visit: Payer: Self-pay

## 2018-12-20 NOTE — Telephone Encounter (Addendum)
Pt contacted pre-catheterization scheduled at [facility] for: Tuesday December 21, 2018 7:30 AM Verified arrival time and place: [facility] Main Entrance A at:5:30 AM Per Dr Apolonio Schneiders flow rate 77ml/hr continuous on arrival  No solid food after midnight prior to cath, clear liquids until 5 AM day of procedure.  Hold: Furosemide- PM prior and AM of procedure. Eplererone-AM of procedure-(pt's brother, [name], not sure patient is taking this, states may have been discontinued because of low BP, he will  discuss with patient and clarify).  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  His brother is present for today's video conference and he helps with communication.  Per [facility]: Patient's brother, [name], will be allowed to accompany patient to Short Stay and be with him before and after cath.     Cardiac Questionnaire: _____________   KAJGO-11 Pre-Screening Questions:  . Do you currently have a fever? no . Have you recently travelled on a cruise, internationally, or to [place], [place], [place], [place], [place], or [place], [place] [employer]) ? no . Have you been in contact with someone that is currently pending confirmation of Covid19 testing or has been confirmed to have the [place] virus? Marland Kitchen Are you currently experiencing fatigue or cough? No . Are you currently experiencing new or worsening shortness of breath at rest or with minimal activity? yes . Have you been in contact with someone that was recently sick with fever/cough/fatigue? no   I reviewed procedure instructions and Covid-19 screening questions with patient's brother, [name].

## 2018-12-20 NOTE — Telephone Encounter (Signed)
Unable to leave a message for patient to remind of missed remote transmission.  

## 2018-12-20 NOTE — Telephone Encounter (Signed)
I spoke with Ander Purpura, Regional Manager [facility], to make her aware pt's brother, [name], will be allowed to accompany patient to admitting to register, then to Short Stay before and after cath. Lauren will let Suezanne Jacquet at [place] know [name] can accompany patient.

## 2018-12-21 ENCOUNTER — Encounter (HOSPITAL_COMMUNITY)
Admission: RE | Disposition: A | Discharge status: Home / Self Care | Payer: Self-pay | Source: Home / Self Care | Attending: Cardiovascular Disease

## 2018-12-21 ENCOUNTER — Other Ambulatory Visit: Payer: Self-pay

## 2018-12-21 ENCOUNTER — Ambulatory Visit (HOSPITAL_COMMUNITY)
Admission: RE | Admit: 2018-12-21 | Discharge: 2018-12-21 | Disposition: A | Discharge status: Home / Self Care | Payer: Medicare Other | Source: Ambulatory Visit | Attending: Cardiovascular Disease | Admitting: Cardiovascular Disease

## 2018-12-21 ENCOUNTER — Ambulatory Visit (HOSPITAL_COMMUNITY)
Admission: RE | Admit: 2018-12-21 | Discharge: 2018-12-21 | Disposition: A | Discharge status: Home / Self Care | Payer: Medicare Other | Attending: Cardiovascular Disease | Admitting: Cardiovascular Disease

## 2018-12-21 DIAGNOSIS — Z8249 Family history of ischemic heart disease and other diseases of the circulatory system: Secondary | ICD-10-CM | POA: Insufficient documentation

## 2018-12-21 DIAGNOSIS — H9190 Unspecified hearing loss, unspecified ear: Secondary | ICD-10-CM | POA: Diagnosis not present

## 2018-12-21 DIAGNOSIS — R0602 Shortness of breath: Secondary | ICD-10-CM | POA: Diagnosis not present

## 2018-12-21 DIAGNOSIS — I2510 Atherosclerotic heart disease of native coronary artery without angina pectoris: Secondary | ICD-10-CM | POA: Diagnosis not present

## 2018-12-21 DIAGNOSIS — F70 Mild intellectual disabilities: Secondary | ICD-10-CM | POA: Insufficient documentation

## 2018-12-21 DIAGNOSIS — I4510 Unspecified right bundle-branch block: Secondary | ICD-10-CM | POA: Diagnosis not present

## 2018-12-21 DIAGNOSIS — I340 Nonrheumatic mitral (valve) insufficiency: Secondary | ICD-10-CM

## 2018-12-21 DIAGNOSIS — Z95810 Presence of automatic (implantable) cardiac defibrillator: Secondary | ICD-10-CM | POA: Diagnosis not present

## 2018-12-21 DIAGNOSIS — I255 Ischemic cardiomyopathy: Secondary | ICD-10-CM | POA: Insufficient documentation

## 2018-12-21 DIAGNOSIS — I5022 Chronic systolic (congestive) heart failure: Secondary | ICD-10-CM | POA: Insufficient documentation

## 2018-12-21 DIAGNOSIS — D649 Anemia, unspecified: Secondary | ICD-10-CM | POA: Insufficient documentation

## 2018-12-21 DIAGNOSIS — Z7982 Long term (current) use of aspirin: Secondary | ICD-10-CM | POA: Diagnosis not present

## 2018-12-21 DIAGNOSIS — I2584 Coronary atherosclerosis due to calcified coronary lesion: Secondary | ICD-10-CM | POA: Insufficient documentation

## 2018-12-21 DIAGNOSIS — M1990 Unspecified osteoarthritis, unspecified site: Secondary | ICD-10-CM | POA: Diagnosis not present

## 2018-12-21 DIAGNOSIS — K219 Gastro-esophageal reflux disease without esophagitis: Secondary | ICD-10-CM | POA: Insufficient documentation

## 2018-12-21 DIAGNOSIS — Z79899 Other long term (current) drug therapy: Secondary | ICD-10-CM | POA: Diagnosis not present

## 2018-12-21 DIAGNOSIS — Z87891 Personal history of nicotine dependence: Secondary | ICD-10-CM | POA: Insufficient documentation

## 2018-12-21 DIAGNOSIS — I5023 Acute on chronic systolic (congestive) heart failure: Secondary | ICD-10-CM | POA: Diagnosis present

## 2018-12-21 DIAGNOSIS — Z888 Allergy status to other drugs, medicaments and biological substances status: Secondary | ICD-10-CM | POA: Insufficient documentation

## 2018-12-21 DIAGNOSIS — Z7951 Long term (current) use of inhaled steroids: Secondary | ICD-10-CM | POA: Insufficient documentation

## 2018-12-21 DIAGNOSIS — I2582 Chronic total occlusion of coronary artery: Secondary | ICD-10-CM | POA: Insufficient documentation

## 2018-12-21 DIAGNOSIS — I252 Old myocardial infarction: Secondary | ICD-10-CM | POA: Insufficient documentation

## 2018-12-21 DIAGNOSIS — Z951 Presence of aortocoronary bypass graft: Secondary | ICD-10-CM | POA: Diagnosis not present

## 2018-12-21 HISTORY — PX: RIGHT/LEFT HEART CATH AND CORONARY/GRAFT ANGIOGRAPHY: CATH118267

## 2018-12-21 LAB — BASIC METABOLIC PANEL
Anion gap: 12 (ref 5–15)
BUN: 37 mg/dL — ABNORMAL HIGH (ref 8–23)
CO2: 30 mmol/L (ref 22–32)
Calcium: 9.5 mg/dL (ref 8.9–10.3)
Chloride: 89 mmol/L — ABNORMAL LOW (ref 98–111)
Creatinine, Ser: 1.42 mg/dL — ABNORMAL HIGH (ref 0.61–1.24)
GFR calc Af Amer: 59 mL/min — ABNORMAL LOW (ref >60)
GFR calc non Af Amer: 51 mL/min — ABNORMAL LOW (ref >60)
Glucose, Bld: 95 mg/dL (ref 70–99)
Potassium: 3.9 mmol/L (ref 3.5–5.1)
Sodium: 131 mmol/L — ABNORMAL LOW (ref 135–145)

## 2018-12-21 LAB — POCT I-STAT 7, (LYTES, BLD GAS, ICA,H+H)
Acid-Base Excess: 8 mmol/L — ABNORMAL HIGH (ref 0.0–2.0)
Bicarbonate: 33.7 mmol/L — ABNORMAL HIGH (ref 20.0–28.0)
Calcium, Ion: 1.17 mmol/L (ref 1.15–1.40)
HCT: 38 % — ABNORMAL LOW (ref 39.0–52.0)
Hemoglobin: 12.9 g/dL — ABNORMAL LOW (ref 13.0–17.0)
O2 Saturation: 94 %
Potassium: 3.8 mmol/L (ref 3.5–5.1)
Sodium: 133 mmol/L — ABNORMAL LOW (ref 135–145)
TCO2: 35 mmol/L — ABNORMAL HIGH (ref 22–32)
pCO2 arterial: 50 mmHg — ABNORMAL HIGH (ref 32.0–48.0)
pH, Arterial: 7.436 (ref 7.350–7.450)
pO2, Arterial: 70 mmHg — ABNORMAL LOW (ref 83.0–108.0)

## 2018-12-21 LAB — CBC
HCT: 40.1 % (ref 39.0–52.0)
Hemoglobin: 13.2 g/dL (ref 13.0–17.0)
MCH: 32 pg (ref 26.0–34.0)
MCHC: 32.9 g/dL (ref 30.0–36.0)
MCV: 97.1 fL (ref 80.0–100.0)
Platelets: 181 10*3/uL (ref 150–400)
RBC: 4.13 MIL/uL — ABNORMAL LOW (ref 4.22–5.81)
RDW: 16.6 % — ABNORMAL HIGH (ref 11.5–15.5)
WBC: 5.6 10*3/uL (ref 4.0–10.5)
nRBC: 0 % (ref 0.0–0.2)

## 2018-12-21 LAB — POCT I-STAT EG7
Acid-Base Excess: 4 mmol/L — ABNORMAL HIGH (ref 0.0–2.0)
Bicarbonate: 29.6 mmol/L — ABNORMAL HIGH (ref 20.0–28.0)
Calcium, Ion: 1 mmol/L — ABNORMAL LOW (ref 1.15–1.40)
HCT: 35 % — ABNORMAL LOW (ref 39.0–52.0)
Hemoglobin: 11.9 g/dL — ABNORMAL LOW (ref 13.0–17.0)
O2 Saturation: 71 %
Potassium: 3.1 mmol/L — ABNORMAL LOW (ref 3.5–5.1)
Sodium: 137 mmol/L (ref 135–145)
TCO2: 31 mmol/L (ref 22–32)
pCO2, Ven: 48 mmHg (ref 44.0–60.0)
pH, Ven: 7.398 (ref 7.250–7.430)
pO2, Ven: 38 mmHg (ref 32.0–45.0)

## 2018-12-21 SURGERY — RIGHT/LEFT HEART CATH AND CORONARY/GRAFT ANGIOGRAPHY
Anesthesia: LOCAL

## 2018-12-21 MED ORDER — SODIUM CHLORIDE 0.9 % IV SOLN
INTRAVENOUS | Status: DC
  Administered 2018-12-21: 06:00:00 via INTRAVENOUS

## 2018-12-21 MED ORDER — SODIUM CHLORIDE 0.9% FLUSH: 3.0000 mL | INTRAVENOUS | Status: DC | PRN

## 2018-12-21 MED ORDER — ASPIRIN 81 MG PO CHEW: 81.0000 mg | CHEWABLE_TABLET | ORAL | Status: AC

## 2018-12-21 MED ORDER — MIDAZOLAM HCL 2 MG/2ML IJ SOLN
INTRAMUSCULAR | Status: AC
  Filled 2018-12-21: qty 2

## 2018-12-21 MED ORDER — LIDOCAINE HCL (PF) 1 % IJ SOLN
INTRAMUSCULAR | Status: AC
  Filled 2018-12-21: qty 30

## 2018-12-21 MED ORDER — SODIUM CHLORIDE 0.9% FLUSH: 3.0000 mL | Freq: Two times a day (BID) | INTRAVENOUS | Status: DC

## 2018-12-21 MED ORDER — SODIUM CHLORIDE 0.9 % IV SOLN: INTRAVENOUS | Status: DC

## 2018-12-21 MED ORDER — LIDOCAINE HCL (PF) 1 % IJ SOLN
INTRAMUSCULAR | Status: DC | PRN
  Administered 2018-12-21 (×2): 2 mL
  Administered 2018-12-21: 15 mL

## 2018-12-21 MED ORDER — LABETALOL HCL 5 MG/ML IV SOLN: 10.0000 mg | INTRAVENOUS | Status: DC | PRN

## 2018-12-21 MED ORDER — ACETAMINOPHEN 325 MG PO TABS: 650.0000 mg | ORAL_TABLET | ORAL | Status: DC | PRN

## 2018-12-21 MED ORDER — HEPARIN (PORCINE) IN NACL 1000-0.9 UT/500ML-% IV SOLN
INTRAVENOUS | Status: AC
  Filled 2018-12-21: qty 1000

## 2018-12-21 MED ORDER — HYDRALAZINE HCL 20 MG/ML IJ SOLN: 10.0000 mg | INTRAMUSCULAR | Status: DC | PRN

## 2018-12-21 MED ORDER — FENTANYL CITRATE (PF) 100 MCG/2ML IJ SOLN
INTRAMUSCULAR | Status: AC
  Filled 2018-12-21: qty 2

## 2018-12-21 MED ORDER — SODIUM CHLORIDE 0.9 % IV SOLN
INTRAVENOUS | Status: AC | PRN
  Administered 2018-12-21: 75 mL/h via INTRAVENOUS

## 2018-12-21 MED ORDER — HEPARIN (PORCINE) IN NACL 1000-0.9 UT/500ML-% IV SOLN
INTRAVENOUS | Status: DC | PRN
  Administered 2018-12-21: 500 mL

## 2018-12-21 MED ORDER — SODIUM CHLORIDE 0.9 % IV SOLN: 250.0000 mL | INTRAVENOUS | Status: DC | PRN

## 2018-12-21 MED ORDER — IOHEXOL 350 MG/ML SOLN
INTRAVENOUS | Status: DC | PRN
  Administered 2018-12-21: 35 mL via INTRAVENOUS

## 2018-12-21 MED ORDER — MIDAZOLAM HCL 2 MG/2ML IJ SOLN
INTRAMUSCULAR | Status: DC | PRN
  Administered 2018-12-21: 1 mg via INTRAVENOUS

## 2018-12-21 MED ORDER — ONDANSETRON HCL 4 MG/2ML IJ SOLN: 4.0000 mg | Freq: Four times a day (QID) | INTRAMUSCULAR | Status: DC | PRN

## 2018-12-21 MED ORDER — VERAPAMIL HCL 2.5 MG/ML IV SOLN
INTRAVENOUS | Status: AC
  Filled 2018-12-21: qty 2

## 2018-12-21 SURGICAL SUPPLY — 16 items
CATH BALLN WEDGE 5F 110CM (CATHETERS) ×2 IMPLANT
CATH INFINITI 5FR MULTPACK ANG (CATHETERS) ×2 IMPLANT
CLOSURE MYNX CONTROL 5F (Vascular Products) ×2 IMPLANT
COVER DOME SNAP 22 D (MISCELLANEOUS) ×2 IMPLANT
ELECT DEFIB PAD ADLT CADENCE (PAD) ×2 IMPLANT
GLIDESHEATH SLEND SS 6F .021 (SHEATH) ×2 IMPLANT
GUIDEWIRE INQWIRE 1.5J.035X260 (WIRE) ×1 IMPLANT
INQWIRE 1.5J .035X260CM (WIRE) ×2
KIT HEART LEFT (KITS) ×2 IMPLANT
PACK CARDIAC CATHETERIZATION (CUSTOM PROCEDURE TRAY) ×2 IMPLANT
SHEATH GLIDE SLENDER 4/5FR (SHEATH) ×2 IMPLANT
SHEATH PINNACLE 5F 10CM (SHEATH) ×2 IMPLANT
SHEATH PROBE COVER 6X72 (BAG) ×2 IMPLANT
TRANSDUCER W/STOPCOCK (MISCELLANEOUS) ×2 IMPLANT
TUBING CIL FLEX 10 FLL-RA (TUBING) ×2 IMPLANT
WIRE EMERALD 3MM-J .035X150CM (WIRE) ×2 IMPLANT

## 2018-12-21 NOTE — Interval H&P Note (Signed)
History and Physical Interval Note:  12/21/2018 7:33 AM  Carl [name]  has presented today for surgery, with the diagnosis of mr.  The various methods of treatment have been discussed with the patient and family. After consideration of risks, benefits and other options for treatment, the patient has consented to  Procedure(s): RIGHT/LEFT HEART CATH AND CORONARY/GRAFT ANGIOGRAPHY (N/A) as a surgical intervention.  The patient's history has been reviewed, patient examined, no change in status, stable for surgery.  I have reviewed the patient's chart and labs.  Questions were answered to the patient's satisfaction.     [name]

## 2018-12-21 NOTE — Progress Notes (Signed)
Pt brother [name] is at bedside.

## 2018-12-21 NOTE — Discharge Instructions (Signed)
Femoral Site Care °This sheet gives you information about how to care for yourself after your procedure. Your health care provider may also give you more specific instructions. If you have problems or questions, contact your health care provider. °What can I expect after the procedure? °After the procedure, it is common to have: °· Bruising that usually fades within 1-2 weeks. °· Tenderness at the site. °Follow these instructions at home: °Wound care °· Follow instructions from your health care provider about how to take care of your insertion site. Make sure you: °? Wash your hands with soap and water before you change your bandage (dressing). If soap and water are not available, use hand sanitizer. °? Change your dressing as told by your health care provider. °? Leave stitches (sutures), skin glue, or adhesive strips in place. These skin closures may need to stay in place for 2 weeks or longer. If adhesive strip edges start to loosen and curl up, you may trim the loose edges. Do not remove adhesive strips completely unless your health care provider tells you to do that. °· Do not take baths, swim, or use a hot tub until your health care provider approves. °· You may shower 24-48 hours after the procedure or as told by your health care provider. °? Gently wash the site with plain soap and water. °? Pat the area dry with a clean towel. °? Do not rub the site. This may cause bleeding. °· Do not apply powder or lotion to the site. Keep the site clean and dry. °· Check your femoral site every day for signs of infection. Check for: °? Redness, swelling, or pain. °? Fluid or blood. °? Warmth. °? Pus or a bad smell. °Activity °· For the first 2-3 days after your procedure, or as long as directed: °? Avoid climbing stairs as much as possible. °? Do not squat. °· Do not lift anything that is heavier than 10 lb (4.5 kg), or the limit that you are told, until your health care provider says that it is safe. °· Rest as  directed. °? Avoid sitting for a long time without moving. Get up to take short walks every 1-2 hours. °· Do not drive for 24 hours if you were given a medicine to help you relax (sedative). °General instructions °· Take over-the-counter and prescription medicines only as told by your health care provider. °· Keep all follow-up visits as told by your health care provider. This is important. °Contact a health care provider if you have: °· A fever or chills. °· You have redness, swelling, or pain around your insertion site. °Get help right away if: °· The catheter insertion area swells very fast. °· You pass out. °· You suddenly start to sweat or your skin gets clammy. °· The catheter insertion area is bleeding, and the bleeding does not stop when you hold steady pressure on the area. °· The area near or just beyond the catheter insertion site becomes pale, cool, tingly, or numb. °These symptoms may represent a serious problem that is an emergency. Do not wait to see if the symptoms will go away. Get medical help right away. Call your local emergency services (911 in the [place] Do not drive yourself to the hospital. °Summary °· After the procedure, it is common to have bruising that usually fades within 1-2 weeks. °· Check your femoral site every day for signs of infection. °· Do not lift anything that is heavier than 10 lb (4.5 kg), or the   limit that you are told, until your health care provider says that it is safe. °This information is not intended to replace advice given to you by your health care provider. Make sure you discuss any questions you have with your health care provider. °Document Released: 04/14/2014 Document Revised: 08/24/2017 Document Reviewed: 08/24/2017 °[employer] Patient Education © 2019 [employer] ° °
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
## 2018-12-21 NOTE — Progress Notes (Signed)
No bleeding or hematoma noted after ambulation 

## 2018-12-21 NOTE — Progress Notes (Addendum)
Discharge instructions reviewed with Pt brother Carl [name] over the telephone. Voices understanding. Also reviewed with pt.

## 2018-12-22 ENCOUNTER — Other Ambulatory Visit: Payer: Self-pay | Admitting: Physician Assistant

## 2018-12-22 ENCOUNTER — Encounter (HOSPITAL_COMMUNITY): Payer: Self-pay | Admitting: Cardiovascular Disease

## 2018-12-22 MED FILL — Fentanyl Citrate Preservative Free (PF) Inj 100 MCG/2ML: INTRAMUSCULAR | Qty: 2 | Status: AC

## 2018-12-22 MED FILL — Verapamil HCl IV Soln 2.5 MG/ML: INTRAVENOUS | Qty: 2 | Status: AC

## 2018-12-23 ENCOUNTER — Other Ambulatory Visit (HOSPITAL_COMMUNITY): Payer: Self-pay | Admitting: General Practice

## 2018-12-24 NOTE — Progress Notes (Signed)
No ICM remote transmission received for 12/20/2018 and next ICM transmission scheduled for 01/10/2019.   

## 2018-12-24 NOTE — Pre-Procedure Instructions (Signed)
[id number] [place], [place] - [address] [place] [place] [address] Phone: [phone] Fax: [phone]      Your procedure is scheduled on Thursday, May 7th.  Report to [facility] Main Entrance "A" Then check in at Admitting at 5:30 A.M.  Call this number if you have problems the morning of surgery:  [phone]  Call [phone] if you have any questions prior to your surgery date Monday-Friday 8am-4pm    Remember:  Do not eat or drink after midnight.   Take these medicines the morning of surgery with A SIP OF WATER  carvedilol (COREG) citalopram (CELEXA) omeprazole (PRILOSEC)  As needed; acetaminophen (TYLENOL).  Follow your surgeon's instructions on when to stop Aspirin.  If no instructions were given by your surgeon then you will need to call the office to get those instructions.    As of today, STOP taking any Aspirin (unless otherwise instructed by your surgeon), Aleve, Naproxen, Ibuprofen, Motrin, Advil, Goody's, BC's, all herbal medications, fish oil, and all vitamins.    The Morning of Surgery  Do not wear jewelry.  Do not wear lotions, powders, colognes, or deodorant  Do not shave 48 hours prior to surgery.  Men may shave face and neck.  Do not bring valuables to the hospital.  [facility] is not responsible for any belongings or valuables.  If you are a smoker, DO NOT Smoke 24 hours prior to surgery  REMEMBER THAT YOU MUST SOMEONE TO TRANSPORT YOU HOME AFTER SURGERY IF YOU ARE DISCHARGED THE SAME DAY OF SURGERY  YOU WILL ALSO NEED SOMEONE TO STAY WITH YOU FOR 24 HOURS AFTER SURGERY IF YOU ARE DISCHARGED THE SAME DAY OF SURGERY   Contacts, glasses, hearing aids, dentures or bridgework may not be worn into surgery.   If you wear a CPAP at night please bring your mask, tubing, and machine the morning of surgery   Leave your suitcase in the car.  After surgery it may be brought to your room.  For patients admitted to the hospital,  discharge time will be determined by your treatment team.  Patients discharged the day of surgery will not be allowed to drive home.    Special instructions:   [place]- Preparing For Surgery  Before surgery, you can play an important role. Because skin is not sterile, your skin needs to be as free of germs as possible. You can reduce the number of germs on your skin by washing with CHG (chlorahexidine gluconate) Soap before surgery.  CHG is an antiseptic cleaner which kills germs and bonds with the skin to continue killing germs even after washing.    Oral Hygiene is also important to reduce your risk of infection.  Remember - BRUSH YOUR TEETH THE MORNING OF SURGERY WITH YOUR REGULAR TOOTHPASTE  Please do not use if you have an allergy to CHG or antibacterial soaps. If your skin becomes reddened/irritated stop using the CHG.  Do not shave (including legs and underarms) for at least 48 hours prior to first CHG shower. It is OK to shave your face.  Please follow these instructions carefully.   1. Shower the NIGHT BEFORE SURGERY and the MORNING OF SURGERY with CHG Soap.   2. If you chose to wash your hair, wash your hair first as usual with your normal shampoo.  3. After you shampoo, rinse your hair and body thoroughly to remove the shampoo.  4. Use CHG as you would any other liquid  soap. You can apply CHG directly to the skin and wash gently with a scrungie or a clean washcloth.   5. Apply the CHG Soap to your body ONLY FROM THE NECK DOWN.  Do not use on open wounds or open sores. Avoid contact with your eyes, ears, mouth and genitals (private parts). Wash Face and genitals (private parts)  with your normal soap.   6. Wash thoroughly, paying special attention to the area where your surgery will be performed.  7. Thoroughly rinse your body with warm water from the neck down.  8. DO NOT shower/wash with your normal soap after using and rinsing off the CHG Soap.  9. Pat yourself dry  with a CLEAN TOWEL.  10. Wear CLEAN PAJAMAS to bed the night before surgery, wear comfortable clothes the morning of surgery  11. Place CLEAN SHEETS on your bed the night of your first shower and DO NOT SLEEP WITH PETS.    Day of Surgery:  Do not apply any deodorants/lotions.  Please wear clean clothes to the hospital/surgery center.   Remember to brush your teeth WITH YOUR REGULAR TOOTHPASTE.   Please read over the following fact sheets that you were given.

## 2018-12-27 ENCOUNTER — Other Ambulatory Visit: Payer: Self-pay

## 2018-12-27 ENCOUNTER — Other Ambulatory Visit (HOSPITAL_COMMUNITY): Payer: Self-pay

## 2018-12-27 ENCOUNTER — Encounter (HOSPITAL_COMMUNITY): Payer: Self-pay

## 2018-12-27 ENCOUNTER — Encounter (HOSPITAL_COMMUNITY)
Admission: RE | Admit: 2018-12-27 | Discharge: 2018-12-27 | Disposition: A | Discharge status: Home / Self Care | Payer: Medicare Other | Source: Ambulatory Visit | Attending: Cardiovascular Disease | Admitting: Cardiovascular Disease

## 2018-12-27 ENCOUNTER — Encounter: Payer: Self-pay | Admitting: Physical Therapy

## 2018-12-27 ENCOUNTER — Ambulatory Visit: Payer: Medicare Other | Attending: Cardiovascular Disease | Admitting: Physical Therapy

## 2018-12-27 ENCOUNTER — Ambulatory Visit (HOSPITAL_COMMUNITY): Admission: RE | Admit: 2018-12-27 | Payer: Medicare Other | Source: Ambulatory Visit

## 2018-12-27 ENCOUNTER — Ambulatory Visit (HOSPITAL_COMMUNITY)
Admission: RE | Admit: 2018-12-27 | Discharge: 2018-12-27 | Disposition: A | Discharge status: Home / Self Care | Payer: Medicare Other | Source: Ambulatory Visit | Attending: Cardiovascular Disease | Admitting: Cardiovascular Disease

## 2018-12-27 DIAGNOSIS — R262 Difficulty in walking, not elsewhere classified: Secondary | ICD-10-CM

## 2018-12-27 DIAGNOSIS — I340 Nonrheumatic mitral (valve) insufficiency: Secondary | ICD-10-CM

## 2018-12-27 HISTORY — DX: Pneumonia, unspecified organism: J18.9

## 2018-12-27 HISTORY — DX: Presence of automatic (implantable) cardiac defibrillator: Z95.810

## 2018-12-27 LAB — COMPREHENSIVE METABOLIC PANEL
ALT: 16 U/L (ref 0–44)
AST: 23 U/L (ref 15–41)
Albumin: 3.8 g/dL (ref 3.5–5.0)
Alkaline Phosphatase: 111 U/L (ref 38–126)
Anion gap: 16 — ABNORMAL HIGH (ref 5–15)
BUN: 42 mg/dL — ABNORMAL HIGH (ref 8–23)
CO2: 20 mmol/L — ABNORMAL LOW (ref 22–32)
Calcium: 9 mg/dL (ref 8.9–10.3)
Chloride: 93 mmol/L — ABNORMAL LOW (ref 98–111)
Creatinine, Ser: 2.13 mg/dL — ABNORMAL HIGH (ref 0.61–1.24)
GFR calc Af Amer: 36 mL/min — ABNORMAL LOW (ref >60)
GFR calc non Af Amer: 31 mL/min — ABNORMAL LOW (ref >60)
Glucose, Bld: 113 mg/dL — ABNORMAL HIGH (ref 70–99)
Potassium: 3.7 mmol/L (ref 3.5–5.1)
Sodium: 129 mmol/L — ABNORMAL LOW (ref 135–145)
Total Bilirubin: 3.1 mg/dL — ABNORMAL HIGH (ref 0.3–1.2)
Total Protein: 7.4 g/dL (ref 6.5–8.1)

## 2018-12-27 LAB — HEMOGLOBIN A1C
Hgb A1c MFr Bld: 5.9 % — ABNORMAL HIGH (ref 4.8–5.6)
Mean Plasma Glucose: 122.63 mg/dL

## 2018-12-27 LAB — URINALYSIS, ROUTINE W REFLEX MICROSCOPIC
Bacteria, UA: NONE SEEN
Bilirubin Urine: NEGATIVE
Glucose, UA: NEGATIVE mg/dL
Hgb urine dipstick: NEGATIVE
Ketones, ur: NEGATIVE mg/dL
Nitrite: NEGATIVE
Protein, ur: NEGATIVE mg/dL
Specific Gravity, Urine: 1.011 (ref 1.005–1.030)
pH: 5 (ref 5.0–8.0)

## 2018-12-27 LAB — CBC
HCT: 36.6 % — ABNORMAL LOW (ref 39.0–52.0)
Hemoglobin: 12.3 g/dL — ABNORMAL LOW (ref 13.0–17.0)
MCH: 32.8 pg (ref 26.0–34.0)
MCHC: 33.6 g/dL (ref 30.0–36.0)
MCV: 97.6 fL (ref 80.0–100.0)
Platelets: 165 10*3/uL (ref 150–400)
RBC: 3.75 MIL/uL — ABNORMAL LOW (ref 4.22–5.81)
RDW: 16.8 % — ABNORMAL HIGH (ref 11.5–15.5)
WBC: 5.8 10*3/uL (ref 4.0–10.5)
nRBC: 0.5 % — ABNORMAL HIGH (ref 0.0–0.2)

## 2018-12-27 LAB — BLOOD GAS, ARTERIAL
Acid-base deficit: 0.1 mmol/L (ref 0.0–2.0)
Bicarbonate: 23.6 mmol/L (ref 20.0–28.0)
Drawn by: 421801
FIO2: 21
O2 Saturation: 97.4 %
Patient temperature: 98.6
pCO2 arterial: 35.5 mmHg (ref 32.0–48.0)
pH, Arterial: 7.437 (ref 7.350–7.450)
pO2, Arterial: 99.6 mmHg (ref 83.0–108.0)

## 2018-12-27 LAB — TYPE AND SCREEN
ABO/RH(D): O POS
Antibody Screen: NEGATIVE

## 2018-12-27 LAB — BRAIN NATRIURETIC PEPTIDE: B Natriuretic Peptide: 1846.8 pg/mL — ABNORMAL HIGH (ref 0.0–100.0)

## 2018-12-27 LAB — SURGICAL PCR SCREEN
MRSA, PCR: NEGATIVE
Staphylococcus aureus: POSITIVE — AB

## 2018-12-27 LAB — APTT: aPTT: 33 seconds (ref 24–36)

## 2018-12-27 LAB — PROTIME-INR
INR: 1.4 — ABNORMAL HIGH (ref 0.8–1.2)
Prothrombin Time: 17.3 seconds — ABNORMAL HIGH (ref 11.4–15.2)

## 2018-12-27 MED ORDER — FUROSEMIDE 40 MG PO TABS: 40.0000 mg | ORAL_TABLET | Freq: Two times a day (BID) | ORAL | 0 refills | Status: DC

## 2018-12-27 NOTE — Progress Notes (Signed)
PCR +MSSA.  Patient's brother, [name], notified.  [facility] spoke with Thurmond Butts.  Sent message to Ander Purpura, RN at Dr. Antionette Char office regarding abnormal labs.

## 2018-12-27 NOTE — Progress Notes (Signed)
Today was a home visit with Carl [name] to place his medications in his med boxes.  He states he is ok, his feet hurt.  He has swelling around ankle area.  Advised him when sitting to prop them up.  He appears to be taking all his medications.  He is drinking water now flavored with koolaid and he is eating Moms meals.  He is scheduled for a clip procedure this week, hopefully help his fluid he has been gaining.  He denies chest pain, shortness of breath at rest, headaches or dizziness.  Meds verified and med box refilled.  His brother and sister watches out for him, he has home health nurse coming out.  He appears to be doing ok at home alone.  He gets confused easily and they try to keep everything in a routine for him.  Will continue to visit for heart failure, diet and medication compliance.   [place] [phone]
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
## 2018-12-27 NOTE — Progress Notes (Signed)
PCP - Dr. Ginette Pitman Cardiologist - Dr. [name]  Chest x-ray - 12/27/2018 EKG - 12/21/2018 Stress Test - 10/15/2016 ECHO - 02/10/2018 Cardiac Cath - 12/21/2018  Sleep Study - patient denies CPAP -   Fasting Blood Sugar - n/a Checks Blood Sugar _____ times a day  Blood Thinner Instructions: n/a Aspirin Instructions: patient instructed to continue ASA  Anesthesia review: yes, hx of CAD, abnormal EKG  Patient denies shortness of breath, fever, cough and chest pain at PAT appointment   Patient verbalized understanding of instructions that were given to them at the PAT appointment. Patient was also instructed that they will need to review over the PAT instructions again at home before surgery.

## 2018-12-27 NOTE — Therapy (Signed)
[place], [place], [address] Phone: [phone]   Fax:  [phone]  Physical Therapy Evaluation/Pre-Mitral Clip  Patient Details  Name: Carl [name] MRN: [id number] Date of Birth: 1950/11/07 Referring Provider (PT): [name], MD   Encounter Date: 12/27/2018  PT End of Session - 12/27/18 1151    Visit Number  1    PT Start Time  1101    PT Stop Time  1144    PT Time Calculation (min)  43 min    Activity Tolerance  Patient tolerated treatment well    Behavior During Therapy  [id number] for tasks assessed/performed       Past Medical History:  Diagnosis Date  . AICD (automatic cardioverter/defibrillator) present   . Anemia   . Anxiety   . Arthritis   . Brunner's gland hyperplasia of duodenum   . CHF (congestive heart failure) ([place])   . Coronary artery disease   . External hemorrhoids   . Fracture of femoral neck, left ([place]) 07/21/2018  . GERD (gastroesophageal reflux disease)   . Hearing loss   . HH (hiatus hernia)   . Internal hemorrhoids   . Ischemic cardiomyopathy   . Leg fracture, left   . Myocardial infarction ([place])   . Paronychia   . Pneumonia   . Psoriasis   . PUD (peptic ulcer disease)   . Schatzki's ring   . Tubular adenoma of colon   . Vitiligo     Past Surgical History:  Procedure Laterality Date  . CARDIAC SURGERY    . COLONOSCOPY WITH ESOPHAGOGASTRODUODENOSCOPY (EGD)    . COLONOSCOPY WITH PROPOFOL N/A 08/24/2017   Procedure: COLONOSCOPY WITH PROPOFOL;  Surgeon: [name], MD;  Location: [facility] ENDOSCOPY;  Service: Endoscopy;  Laterality: N/A;  . CORONARY ARTERY BYPASS GRAFT N/A 08/03/2014   Procedure: CORONARY ARTERY BYPASS GRAFTING (CABG);  Surgeon: [name], MD;  Location: [place];  Service: Open Heart Surgery;  Laterality: N/A;  . EP IMPLANTABLE DEVICE N/A 05/01/2015   MDT ICD implanted for primary prevention of sudden death  . ESOPHAGOGASTRODUODENOSCOPY (EGD)  WITH PROPOFOL N/A 04/16/2015   Procedure: ESOPHAGOGASTRODUODENOSCOPY (EGD) WITH PROPOFOL;  Surgeon: [name], MD;  Location[facility] ENDOSCOPY;  Service: Endoscopy;  Laterality: N/A;  . ESOPHAGOGASTRODUODENOSCOPY (EGD) WITH PROPOFOL N/A 08/24/2017   Procedure: ESOPHAGOGASTRODUODENOSCOPY (EGD) WITH PROPOFOL;  Surgeon: [name], MD;  Location: [facility] ENDOSCOPY;  Service: Endoscopy;  Laterality: N/A;  . FRACTURE SURGERY    . HERNIA REPAIR    . HIP PINNING,CANNULATED Left 07/22/2018   Procedure: CANNULATED HIP PINNING;  Surgeon: [name], MD;  Location: [place];  Service: Orthopedics;  Laterality: Left;  . RIGHT/LEFT HEART CATH AND CORONARY/GRAFT ANGIOGRAPHY N/A 12/21/2018   Procedure: RIGHT/LEFT HEART CATH AND CORONARY/GRAFT ANGIOGRAPHY;  Surgeon: [name], MD;  Location: [place] CV LAB;  Service: Cardiovascular;  Laterality: N/A;  . TEE WITHOUT CARDIOVERSION N/A 08/03/2014   Procedure: TRANSESOPHAGEAL ECHOCARDIOGRAM (TEE);  Surgeon: [name], MD;  Location: [place];  Service: Open Heart Surgery;  Laterality: N/A;  . TEE WITHOUT CARDIOVERSION N/A 02/10/2018   Procedure: TRANSESOPHAGEAL ECHOCARDIOGRAM (TEE);  Surgeon: [name], MD;  Location: [facility] ENDOSCOPY;  Service: Cardiovascular;  Laterality: N/A;    There were no vitals filed for this visit.   Subjective Assessment - 12/27/18 1108    Subjective  Gets short of breath when I walk. Has back pain but took tylenol so he does not hurt right now.  I broke my left leg twice.     Currently in Pain?  No/denies    Aggravating Factors   laying supine         [facility] PT Assessment - 12/27/18 0001      Assessment   Medical Diagnosis  severe mitral regurgitation    Referring Provider (PT)  [name], MD    Onset Date/Surgical Date  --   a couple of years ago   Hand Dominance  Left      Precautions   Precautions  Fall      Restrictions   Weight Bearing Restrictions  No      Balance Screen   Has the  patient fallen in the past 6 months  Yes    How many times?  1    Has the patient had a decrease in activity level because of a fear of falling?   Yes    Is the patient reluctant to leave their home because of a fear of falling?   No      Home Social worker  Private residence    Living Arrangements  Alone    Additional Comments  has dog at home, no stairs      Prior Function   Level of Independence  Independent with basic ADLs   brother and sister assist   Vocation  Retired      Associate Professor   Overall Cognitive Status  Within Functional Limits for tasks assessed      Sensation   Additional Comments  feet "hurt"      Posture/Postural Control   Posture Comments  stands lacking full extension in knees and hips, kyphotic with fwd head & rounded shoulders- using RW      ROM / Strength   AROM / PROM / Strength  AROM;Strength      AROM   Overall AROM   Within functional limits for tasks performed      Strength   Overall Strength Comments  gross 4+/5    Strength Assessment Site  Hand    Right/Left hand  Right;Left    Right Hand Grip (lbs)  45    Left Hand Grip (lbs)  50       [facility] Pre-Surgical Assessment - 12/27/18 0001    5 Meter Walk Test- trial 1  4 sec    5 Meter Walk Test- trial 2  4 sec.     5 Meter Walk Test- trial 3  5 sec.    5 meter walk test average  4.33 sec    4 Stage Balance Test Position  3    Sit To Stand Test- trial 1  23 sec.    Comment  pushing from knees    6 Minute Walk- Baseline  yes    BP (mmHg)  (!) 85/59    HR (bpm)  68    02 Sat (%RA)  96 %    Modified Borg Scale for Dyspnea  0- Nothing at all    Perceived Rate of Exertion (Borg)  6-    6 Minute Walk Post Test  yes    BP (mmHg)  90/68    HR (bpm)  66    02 Sat (%RA)  99 %    Modified Borg Scale for Dyspnea  2- Mild shortness of breath    Perceived Rate of Exertion (Borg)  15- Hard    Aerobic Endurance Distance Walked  581    Endurance additional comments  69% disability  compared to age related norm              Objective measurements completed on examination: See above findings.        Plan - 12/27/18 1151    PT Frequency  --   one time pre-mitral clip eval   Consulted and Agree with Plan of Care  Patient      Clinical Impression Statement: Pt is a 68 yo M presenting to OP PT for evaluation prior to possible Mitral Clip surgery due to severe mitral regurgitation. Pt reports onset of SOB a few years ago. Symptoms are  limiting functional ambulation. Pt presents with WFL ROM and strength and reports musculoskeletal pain in his low back.  Pt ambulated a total of 581 feet in 6 minute walk and reported 2/10 SOB on modified scale for dyspena and 15/20 RPE on Borg's perceived exertion and pain scale at the end of the walk. During the 6 minute walk test, patient's HR increased to 75 BPM and decreased to 58 BPM and O2 saturation decreased to 96%. Based on the Short Physical Performance Battery, patient has a frailty rating of 8/12 with </= 5/12 considered frail.   Visit Diagnosis: Difficulty in walking, not elsewhere classified     Problem List Patient Active Problem List   Diagnosis Date Noted  . CAD (coronary artery disease) 07/21/2018  . Chronic systolic CHF (congestive heart failure) ([place]) 07/21/2018  . GERD (gastroesophageal reflux disease) 07/21/2018  . Depression 07/21/2018  . CKD (chronic kidney disease), stage III ([place]) 07/21/2018  . Iron deficiency anemia 07/21/2018  . Hypokalemia 07/21/2018  . Closed left hip fracture ([place]) 07/21/2018  . Fracture of femoral neck, left ([place]) 07/21/2018  . Lower extremity pain, bilateral 11/12/2017  . Hyperlipidemia 09/01/2017  . Essential hypertension 09/01/2017  . Prostate cancer screening 04/28/2017  . Hydrocele 04/28/2017  . Bradycardia   . Premature ventricular contraction   . Systolic dysfunction with acute on chronic heart failure ([place])   . Severe mitral insufficiency   . Chronic systolic  dysfunction of left ventricle 05/01/2015  . Cardiomyopathy, ischemic 04/26/2015  . Difficulty hearing 12/29/2014  . Acute on chronic systolic CHF (congestive heart failure) ([place]) 10/10/2014  . 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 Acute systolic heart failure ([place]) 08/01/2014  . Protein-calorie malnutrition, severe ([place]) 07/29/2014  . ST elevation myocardial infarction (STEMI) of anterolateral wall ([place]) 07/29/2014  . Cardiogenic shock ([place]) 07/29/2014  . Tobacco use 07/29/2014  . AKI (acute kidney injury) ([place]) 07/29/2014    Porsha Skilton C. Dawnell Bryant PT, DPT 12/27/18 11:52 AM   [facility] [address] [place], [place], [address] Phone: [phone]   Fax:  [phone]  Name: REFUJIO HAYMER MRN: [id number] Date of Birth: 1950-09-02

## 2018-12-28 ENCOUNTER — Other Ambulatory Visit (HOSPITAL_COMMUNITY): Payer: Self-pay | Admitting: Internal Medicine

## 2018-12-28 LAB — NOVEL CORONAVIRUS, NAA (HOSP ORDER, SEND-OUT TO REF LAB; TAT 18-24 HRS): SARS-CoV-2, NAA: NOT DETECTED

## 2018-12-29 ENCOUNTER — Other Ambulatory Visit: Payer: Self-pay | Admitting: Internal Medicine

## 2018-12-29 MED ORDER — VANCOMYCIN HCL 10 G IV SOLR
1250.0000 mg | INTRAVENOUS | Status: AC
  Administered 2018-12-30: 07:00:00 1250 mg via INTRAVENOUS
  Filled 2018-12-29: qty 1250

## 2018-12-29 MED ORDER — SODIUM CHLORIDE 0.9 % IV SOLN
1.5000 g | INTRAVENOUS | Status: AC
  Administered 2018-12-30: 1.5 g via INTRAVENOUS
  Filled 2018-12-29: qty 1.5

## 2018-12-30 ENCOUNTER — Inpatient Hospital Stay (HOSPITAL_COMMUNITY): Payer: Medicare Other

## 2018-12-30 ENCOUNTER — Other Ambulatory Visit: Payer: Self-pay | Admitting: Physician Assistant

## 2018-12-30 ENCOUNTER — Inpatient Hospital Stay (HOSPITAL_COMMUNITY): Payer: Medicare Other | Admitting: Physician Assistant

## 2018-12-30 ENCOUNTER — Encounter (HOSPITAL_COMMUNITY)
Admission: RE | Disposition: A | Discharge status: Home / Self Care | Payer: Self-pay | Source: Home / Self Care | Attending: Cardiovascular Disease

## 2018-12-30 ENCOUNTER — Inpatient Hospital Stay (HOSPITAL_COMMUNITY)
Admission: RE | Admit: 2018-12-30 | Discharge: 2018-12-31 | DRG: 266 | Disposition: A | Discharge status: Home / Self Care | Payer: Medicare Other | Attending: Cardiovascular Disease | Admitting: Cardiovascular Disease

## 2018-12-30 ENCOUNTER — Encounter (HOSPITAL_COMMUNITY): Payer: Self-pay

## 2018-12-30 ENCOUNTER — Other Ambulatory Visit: Payer: Self-pay

## 2018-12-30 DIAGNOSIS — H9190 Unspecified hearing loss, unspecified ear: Secondary | ICD-10-CM

## 2018-12-30 DIAGNOSIS — I5021 Acute systolic (congestive) heart failure: Secondary | ICD-10-CM | POA: Diagnosis present

## 2018-12-30 DIAGNOSIS — Z1159 Encounter for screening for other viral diseases: Secondary | ICD-10-CM | POA: Diagnosis not present

## 2018-12-30 DIAGNOSIS — E43 Unspecified severe protein-calorie malnutrition: Secondary | ICD-10-CM | POA: Diagnosis present

## 2018-12-30 DIAGNOSIS — N179 Acute kidney failure, unspecified: Secondary | ICD-10-CM | POA: Diagnosis present

## 2018-12-30 DIAGNOSIS — F419 Anxiety disorder, unspecified: Secondary | ICD-10-CM | POA: Diagnosis present

## 2018-12-30 DIAGNOSIS — N183 Chronic kidney disease, stage 3 unspecified: Secondary | ICD-10-CM | POA: Diagnosis present

## 2018-12-30 DIAGNOSIS — I340 Nonrheumatic mitral (valve) insufficiency: Secondary | ICD-10-CM

## 2018-12-30 DIAGNOSIS — E785 Hyperlipidemia, unspecified: Secondary | ICD-10-CM | POA: Diagnosis present

## 2018-12-30 DIAGNOSIS — Z006 Encounter for examination for normal comparison and control in clinical research program: Secondary | ICD-10-CM

## 2018-12-30 DIAGNOSIS — K219 Gastro-esophageal reflux disease without esophagitis: Secondary | ICD-10-CM | POA: Diagnosis present

## 2018-12-30 DIAGNOSIS — I130 Hypertensive heart and chronic kidney disease with heart failure and stage 1 through stage 4 chronic kidney disease, or unspecified chronic kidney disease: Secondary | ICD-10-CM | POA: Diagnosis present

## 2018-12-30 DIAGNOSIS — Z7951 Long term (current) use of inhaled steroids: Secondary | ICD-10-CM | POA: Diagnosis not present

## 2018-12-30 DIAGNOSIS — I5023 Acute on chronic systolic (congestive) heart failure: Secondary | ICD-10-CM | POA: Diagnosis present

## 2018-12-30 DIAGNOSIS — F70 Mild intellectual disabilities: Secondary | ICD-10-CM | POA: Diagnosis present

## 2018-12-30 DIAGNOSIS — I252 Old myocardial infarction: Secondary | ICD-10-CM

## 2018-12-30 DIAGNOSIS — Z79899 Other long term (current) drug therapy: Secondary | ICD-10-CM

## 2018-12-30 DIAGNOSIS — Z87891 Personal history of nicotine dependence: Secondary | ICD-10-CM

## 2018-12-30 DIAGNOSIS — Z7982 Long term (current) use of aspirin: Secondary | ICD-10-CM | POA: Diagnosis not present

## 2018-12-30 DIAGNOSIS — Z9889 Other specified postprocedural states: Secondary | ICD-10-CM

## 2018-12-30 DIAGNOSIS — Q211 Atrial septal defect: Secondary | ICD-10-CM | POA: Diagnosis present

## 2018-12-30 DIAGNOSIS — Z951 Presence of aortocoronary bypass graft: Secondary | ICD-10-CM | POA: Diagnosis not present

## 2018-12-30 DIAGNOSIS — Z95810 Presence of automatic (implantable) cardiac defibrillator: Secondary | ICD-10-CM

## 2018-12-30 DIAGNOSIS — I2510 Atherosclerotic heart disease of native coronary artery without angina pectoris: Secondary | ICD-10-CM | POA: Diagnosis present

## 2018-12-30 DIAGNOSIS — Z6821 Body mass index (BMI) 21.0-21.9, adult: Secondary | ICD-10-CM

## 2018-12-30 DIAGNOSIS — I255 Ischemic cardiomyopathy: Secondary | ICD-10-CM | POA: Diagnosis present

## 2018-12-30 DIAGNOSIS — I10 Essential (primary) hypertension: Secondary | ICD-10-CM | POA: Diagnosis present

## 2018-12-30 HISTORY — PX: ATRIAL SEPTAL DEFECT(ASD) CLOSURE: CATH118299

## 2018-12-30 HISTORY — PX: MITRAL VALVE REPAIR: CATH118311

## 2018-12-30 LAB — BASIC METABOLIC PANEL
Anion gap: 16 — ABNORMAL HIGH (ref 5–15)
BUN: 50 mg/dL — ABNORMAL HIGH (ref 8–23)
CO2: 23 mmol/L (ref 22–32)
Calcium: 9.4 mg/dL (ref 8.9–10.3)
Chloride: 88 mmol/L — ABNORMAL LOW (ref 98–111)
Creatinine, Ser: 2.49 mg/dL — ABNORMAL HIGH (ref 0.61–1.24)
GFR calc Af Amer: 30 mL/min — ABNORMAL LOW (ref >60)
GFR calc non Af Amer: 26 mL/min — ABNORMAL LOW (ref >60)
Glucose, Bld: 113 mg/dL — ABNORMAL HIGH (ref 70–99)
Potassium: 3.8 mmol/L (ref 3.5–5.1)
Sodium: 127 mmol/L — ABNORMAL LOW (ref 135–145)

## 2018-12-30 LAB — POCT ACTIVATED CLOTTING TIME
Activated Clotting Time: 208 seconds
Activated Clotting Time: 268 seconds
Activated Clotting Time: 274 seconds
Activated Clotting Time: 307 seconds

## 2018-12-30 LAB — CBC
HCT: 34.4 % — ABNORMAL LOW (ref 39.0–52.0)
Hemoglobin: 11.6 g/dL — ABNORMAL LOW (ref 13.0–17.0)
MCH: 33 pg (ref 26.0–34.0)
MCHC: 33.7 g/dL (ref 30.0–36.0)
MCV: 97.7 fL (ref 80.0–100.0)
Platelets: 150 10*3/uL (ref 150–400)
RBC: 3.52 MIL/uL — ABNORMAL LOW (ref 4.22–5.81)
RDW: 17.3 % — ABNORMAL HIGH (ref 11.5–15.5)
WBC: 6.8 10*3/uL (ref 4.0–10.5)
nRBC: 0 % (ref 0.0–0.2)

## 2018-12-30 LAB — CREATININE, SERUM
Creatinine, Ser: 2.42 mg/dL — ABNORMAL HIGH (ref 0.61–1.24)
GFR calc Af Amer: 31 mL/min — ABNORMAL LOW (ref >60)
GFR calc non Af Amer: 27 mL/min — ABNORMAL LOW (ref >60)

## 2018-12-30 SURGERY — MITRAL VALVE REPAIR
Anesthesia: General

## 2018-12-30 MED ORDER — SODIUM CHLORIDE 0.9% FLUSH
3.0000 mL | Freq: Two times a day (BID) | INTRAVENOUS | Status: DC
  Administered 2018-12-30 – 2018-12-31 (×2): 3 mL via INTRAVENOUS

## 2018-12-30 MED ORDER — SUCCINYLCHOLINE CHLORIDE 200 MG/10ML IV SOSY
PREFILLED_SYRINGE | INTRAVENOUS | Status: DC | PRN
  Administered 2018-12-30: 80 mg via INTRAVENOUS

## 2018-12-30 MED ORDER — SODIUM CHLORIDE 0.9 % IV SOLN
INTRAVENOUS | Status: DC
  Administered 2018-12-30: 07:00:00 via INTRAVENOUS

## 2018-12-30 MED ORDER — SODIUM CHLORIDE 0.9 % IV SOLN: 250.0000 mL | INTRAVENOUS | Status: DC | PRN

## 2018-12-30 MED ORDER — HEPARIN SODIUM (PORCINE) 5000 UNIT/ML IJ SOLN
5000.0000 [IU] | Freq: Three times a day (TID) | INTRAMUSCULAR | Status: DC
  Administered 2018-12-31: 5000 [IU] via SUBCUTANEOUS
  Filled 2018-12-30: qty 1

## 2018-12-30 MED ORDER — PANTOPRAZOLE SODIUM 40 MG PO TBEC
40.0000 mg | DELAYED_RELEASE_TABLET | Freq: Every day | ORAL | Status: DC
  Administered 2018-12-31: 09:00:00 40 mg via ORAL
  Filled 2018-12-30: qty 1

## 2018-12-30 MED ORDER — HEPARIN (PORCINE) IN NACL 1000-0.9 UT/500ML-% IV SOLN
INTRAVENOUS | Status: AC
  Filled 2018-12-30: qty 1500

## 2018-12-30 MED ORDER — CHLORHEXIDINE GLUCONATE 4 % EX LIQD: 30.0000 mL | CUTANEOUS | Status: DC

## 2018-12-30 MED ORDER — LIDOCAINE 2% (20 MG/ML) 5 ML SYRINGE
INTRAMUSCULAR | Status: DC | PRN
  Administered 2018-12-30: 60 mg via INTRAVENOUS

## 2018-12-30 MED ORDER — CITALOPRAM HYDROBROMIDE 20 MG PO TABS
20.0000 mg | ORAL_TABLET | Freq: Every day | ORAL | Status: DC
  Administered 2018-12-31: 09:00:00 20 mg via ORAL
  Filled 2018-12-30: qty 1

## 2018-12-30 MED ORDER — CLOPIDOGREL BISULFATE 75 MG PO TABS
75.0000 mg | ORAL_TABLET | Freq: Every day | ORAL | Status: DC
  Administered 2018-12-31: 75 mg via ORAL
  Filled 2018-12-30: qty 1

## 2018-12-30 MED ORDER — ASPIRIN EC 81 MG PO TBEC
81.0000 mg | DELAYED_RELEASE_TABLET | Freq: Every day | ORAL | Status: DC
  Administered 2018-12-31: 81 mg via ORAL
  Filled 2018-12-30: qty 1

## 2018-12-30 MED ORDER — ONDANSETRON HCL 4 MG/2ML IJ SOLN
INTRAMUSCULAR | Status: DC | PRN
  Administered 2018-12-30: 4 mg via INTRAVENOUS

## 2018-12-30 MED ORDER — HEPARIN SODIUM (PORCINE) 1000 UNIT/ML IJ SOLN
INTRAMUSCULAR | Status: DC | PRN
  Administered 2018-12-30: 2000 [IU] via INTRAVENOUS
  Administered 2018-12-30: 6000 [IU] via INTRAVENOUS
  Administered 2018-12-30: 2000 [IU] via INTRAVENOUS
  Administered 2018-12-30: 3000 [IU] via INTRAVENOUS
  Administered 2018-12-30: 5000 [IU] via INTRAVENOUS

## 2018-12-30 MED ORDER — HEPARIN (PORCINE) IN NACL 2000-0.9 UNIT/L-% IV SOLN
INTRAVENOUS | Status: AC
  Filled 2018-12-30: qty 2000

## 2018-12-30 MED ORDER — SUGAMMADEX SODIUM 500 MG/5ML IV SOLN
INTRAVENOUS | Status: DC | PRN
  Administered 2018-12-30: 200 mg via INTRAVENOUS

## 2018-12-30 MED ORDER — SODIUM CHLORIDE 0.9 % IV SOLN
INTRAVENOUS | Status: DC | PRN
  Administered 2018-12-30: 08:00:00 50 ug/min via INTRAVENOUS

## 2018-12-30 MED ORDER — HEPARIN (PORCINE) IN NACL 1000-0.9 UT/500ML-% IV SOLN
INTRAVENOUS | Status: DC | PRN
  Administered 2018-12-30: 500 mL

## 2018-12-30 MED ORDER — FERROUS SULFATE 325 (65 FE) MG PO TABS
325.0000 mg | ORAL_TABLET | Freq: Every day | ORAL | Status: DC
  Administered 2018-12-31: 07:00:00 325 mg via ORAL
  Filled 2018-12-30: qty 1

## 2018-12-30 MED ORDER — CHLORHEXIDINE GLUCONATE 0.12 % MT SOLN
OROMUCOSAL | Status: AC
  Administered 2018-12-30: 15 mL via OROMUCOSAL
  Filled 2018-12-30: qty 15

## 2018-12-30 MED ORDER — DEXAMETHASONE SODIUM PHOSPHATE 10 MG/ML IJ SOLN
INTRAMUSCULAR | Status: DC | PRN
  Administered 2018-12-30: 5 mg via INTRAVENOUS

## 2018-12-30 MED ORDER — CHLORHEXIDINE GLUCONATE 4 % EX LIQD: 60.0000 mL | Freq: Once | CUTANEOUS | Status: DC

## 2018-12-30 MED ORDER — CARVEDILOL 3.125 MG PO TABS
3.1250 mg | ORAL_TABLET | Freq: Two times a day (BID) | ORAL | Status: DC
  Administered 2018-12-31: 3.125 mg via ORAL
  Filled 2018-12-30 (×2): qty 1

## 2018-12-30 MED ORDER — SODIUM CHLORIDE 0.9% FLUSH: 3.0000 mL | INTRAVENOUS | Status: DC | PRN

## 2018-12-30 MED ORDER — ACETAMINOPHEN 325 MG PO TABS: 325.0000 mg | ORAL_TABLET | Freq: Four times a day (QID) | ORAL | Status: DC | PRN

## 2018-12-30 MED ORDER — PROPOFOL 10 MG/ML IV BOLUS
INTRAVENOUS | Status: DC | PRN
  Administered 2018-12-30: 70 mg via INTRAVENOUS
  Administered 2018-12-30: 10 mg via INTRAVENOUS

## 2018-12-30 MED ORDER — MIDAZOLAM HCL 5 MG/5ML IJ SOLN
INTRAMUSCULAR | Status: DC | PRN
  Administered 2018-12-30: 1 mg via INTRAVENOUS

## 2018-12-30 MED ORDER — PROTAMINE SULFATE 10 MG/ML IV SOLN
INTRAVENOUS | Status: DC | PRN
  Administered 2018-12-30: 50 mg via INTRAVENOUS

## 2018-12-30 MED ORDER — CHLORHEXIDINE GLUCONATE 0.12 % MT SOLN
15.0000 mL | Freq: Once | OROMUCOSAL | Status: AC
  Administered 2018-12-30: 15 mL via OROMUCOSAL

## 2018-12-30 MED ORDER — ALBUMIN HUMAN 5 % IV SOLN
INTRAVENOUS | Status: DC | PRN
  Administered 2018-12-30: 09:00:00 via INTRAVENOUS

## 2018-12-30 MED ORDER — ATORVASTATIN CALCIUM 40 MG PO TABS
40.0000 mg | ORAL_TABLET | Freq: Every day | ORAL | Status: DC
  Administered 2018-12-30: 40 mg via ORAL
  Filled 2018-12-30: qty 1

## 2018-12-30 MED ORDER — ONDANSETRON HCL 4 MG/2ML IJ SOLN: 4.0000 mg | Freq: Four times a day (QID) | INTRAMUSCULAR | Status: DC | PRN

## 2018-12-30 MED ORDER — ADULT MULTIVITAMIN W/MINERALS CH
1.0000 | ORAL_TABLET | Freq: Every day | ORAL | Status: DC
  Administered 2018-12-31: 09:00:00 1 via ORAL
  Filled 2018-12-30: qty 1

## 2018-12-30 MED ORDER — HEPARIN (PORCINE) IN NACL 1000-0.9 UT/500ML-% IV SOLN
INTRAVENOUS | Status: DC | PRN
  Administered 2018-12-30 (×2): 500 mL

## 2018-12-30 MED ORDER — HEPARIN (PORCINE) IN NACL 2000-0.9 UNIT/L-% IV SOLN
INTRAVENOUS | Status: DC | PRN
  Administered 2018-12-30: 1000 mL

## 2018-12-30 MED ORDER — ROCURONIUM BROMIDE 10 MG/ML (PF) SYRINGE
PREFILLED_SYRINGE | INTRAVENOUS | Status: DC | PRN
  Administered 2018-12-30: 10 mg via INTRAVENOUS
  Administered 2018-12-30: 30 mg via INTRAVENOUS

## 2018-12-30 MED ORDER — FENTANYL CITRATE (PF) 100 MCG/2ML IJ SOLN
INTRAMUSCULAR | Status: DC | PRN
  Administered 2018-12-30 (×2): 50 ug via INTRAVENOUS

## 2018-12-30 MED ORDER — SODIUM CHLORIDE 0.9 % IV BOLUS
250.0000 mL | Freq: Once | INTRAVENOUS | Status: AC
  Administered 2018-12-30: 250 mL via INTRAVENOUS

## 2018-12-30 SURGICAL SUPPLY — 26 items
BLANKET WARM UNDERBOD FULL ACC (MISCELLANEOUS) ×3 IMPLANT
CATH INFINITI 6F MPB2 (CATHETERS) ×3 IMPLANT
CATHETER STEERABLE GUIDE (CATHETERS) ×3 IMPLANT
CLIP MITRACLIP NTR (Prosthesis & Implant Heart) ×6 IMPLANT
DEVICE CLOSURE PERCLS PRGLD 6F (VASCULAR PRODUCTS) ×2 IMPLANT
ELECT DEFIB PAD ADLT CADENCE (PAD) ×3 IMPLANT
GUIDEWIRE ANGLED .035X150CM (WIRE) ×3 IMPLANT
GUIDEWIRE SAFE TJ AMPLATZ EXST (WIRE) ×3 IMPLANT
KIT DILATOR VASC 18G NDL (KITS) ×3 IMPLANT
KIT HEART LEFT (KITS) ×6 IMPLANT
KIT MICROPUNCTURE NIT STIFF (SHEATH) ×3 IMPLANT
MITRACLIP 1 SGC 2 NTR MBR0102 (KITS) ×3 IMPLANT
NEEDLE BAYLIS TRANSSEPTAL 71CM (NEEDLE) ×3 IMPLANT
OCCLUDER AMPLATZER SEPTAL 10MM (Prosthesis & Implant Heart) ×3 IMPLANT
PACK CARDIAC CATHETERIZATION (CUSTOM PROCEDURE TRAY) ×3 IMPLANT
PERCLOSE PROGLIDE 6F (VASCULAR PRODUCTS) ×6
SHEATH PINNACLE 8F 10CM (SHEATH) ×3 IMPLANT
SHEATH PROBE COVER 6X72 (BAG) ×6 IMPLANT
SHEATH SWARTZ SL1 8.5FR 63 (SHEATH) ×3 IMPLANT
STOPCOCK MORSE 400PSI 3WAY (MISCELLANEOUS) ×18 IMPLANT
SYSTEM DELIVERY AMPLATZER 8FR (SHEATH) ×3 IMPLANT
TRANSDUCER W/STOPCOCK (MISCELLANEOUS) ×3 IMPLANT
TUBING ART PRESS 72  MALE/FEM (TUBING) ×2
TUBING ART PRESS 72 MALE/FEM (TUBING) ×1 IMPLANT
TUBING CIL FLEX 10 FLL-RA (TUBING) ×3 IMPLANT
WIRE EMERALD 3MM-J .035X150CM (WIRE) ×3 IMPLANT

## 2018-12-30 NOTE — Anesthesia Procedure Notes (Signed)
Procedure Name: Intubation Date/Time: 12/30/2018 8:24 AM Performed by: Moshe Salisbury, CRNA Pre-anesthesia Checklist: Patient identified, Emergency Drugs available, Suction available and Patient being monitored Patient Re-evaluated:Patient Re-evaluated prior to induction Oxygen Delivery Method: Circle System Utilized Preoxygenation: Pre-oxygenation with 100% oxygen Induction Type: IV induction and Rapid sequence Laryngoscope Size: Mac and 4 Grade View: Grade I Tube type: Oral Tube size: 8.0 mm Number of attempts: 1 Airway Equipment and Method: Stylet Placement Confirmation: ETT inserted through vocal cords under direct vision,  positive ETCO2 and breath sounds checked- equal and bilateral Secured at: 22 cm Tube secured with: Tape Dental Injury: Teeth and Oropharynx as per pre-operative assessment

## 2018-12-30 NOTE — Progress Notes (Signed)
Left radial arterial line removed, 10 minutes manual hold. Gauze and tegaderm dressing. Palpable left radial pulse.

## 2018-12-30 NOTE — Interval H&P Note (Signed)
History and Physical Interval Note:  12/30/2018 7:29 AM  Carl [name]  has presented today for surgery, with the diagnosis of Severe Mitral Regurgitation.  The various methods of treatment have been discussed with the patient and family. After consideration of risks, benefits and other options for treatment, the patient has consented to  Procedure(s): MITRAL VALVE REPAIR (N/A) as a surgical intervention.  The patient's history has been reviewed, patient examined, no change in status, stable for surgery.  I have reviewed the patient's chart and labs.  Questions were answered to the patient's satisfaction.     [name]

## 2018-12-30 NOTE — Transfer of Care (Signed)
Immediate Anesthesia Transfer of Care Note  Patient: Carl [name]  Procedure(s) Performed: MITRAL VALVE REPAIR (N/A ) ATRIAL SEPTAL DEFECT(ASD) CLOSURE (N/A )  Patient Location: Cath Lab  Anesthesia Type:General  Level of Consciousness: drowsy and patient cooperative  Airway & Oxygen Therapy: Patient Spontanous Breathing and Patient connected to nasal cannula oxygen  Post-op Assessment: Report given to RN and Post -op Vital signs reviewed and stable  Post vital signs: Reviewed and stable  Last Vitals:  Vitals Value Taken Time  BP 94/53 12/30/2018 11:15 AM  Temp 36.4 C 12/30/2018 11:17 AM  Pulse 56 12/30/2018 11:18 AM  Resp 19 12/30/2018 11:18 AM  SpO2 99 % 12/30/2018 11:18 AM  Vitals shown include unvalidated device data.  Last Pain:  Vitals:   12/30/18 1117  TempSrc: Temporal  PainSc: 0-No pain         Complications: No apparent anesthesia complications

## 2018-12-30 NOTE — Anesthesia Postprocedure Evaluation (Signed)
Anesthesia Post Note  Patient: Carl [name]  Procedure(s) Performed: MITRAL VALVE REPAIR (N/A ) ATRIAL SEPTAL DEFECT(ASD) CLOSURE (N/A )     Patient location during evaluation: Cath Lab Anesthesia Type: General Level of consciousness: awake and alert Pain management: pain level controlled Vital Signs Assessment: post-procedure vital signs reviewed and stable Respiratory status: spontaneous breathing, nonlabored ventilation and respiratory function stable Cardiovascular status: blood pressure returned to baseline and stable Postop Assessment: no apparent nausea or vomiting Anesthetic complications: no    Last Vitals:  Vitals:   12/30/18 1155 12/30/18 1215  BP: (!) 91/54   Pulse: (!) 56   Resp: 15   Temp:  36.6 C  SpO2: 98%     Last Pain:  Vitals:   12/30/18 1215  TempSrc: Temporal  PainSc:                  [name],W. EDMOND

## 2018-12-30 NOTE — Plan of Care (Signed)

## 2018-12-30 NOTE — Anesthesia Procedure Notes (Addendum)
Arterial Line Insertion Start/End5/02/2019 7:50 AM, 12/30/2018 8:00 AM Performed by: Murvin Natal, MD, anesthesiologist  Patient location: Pre-op. Preanesthetic checklist: patient identified, IV checked, site marked, risks and benefits discussed, surgical consent, monitors and equipment checked, pre-op evaluation, timeout performed and anesthesia consent Lidocaine 1% used for infiltration and patient sedated Left, radial was placed Catheter size: 20 Fr Hand hygiene performed , maximum sterile barriers used  and Seldinger technique used  Attempts: 1 (Previous attempts by CRNA) Procedure performed using ultrasound guided technique. Ultrasound Notes:anatomy identified, needle tip was noted to be adjacent to the nerve/plexus identified and no ultrasound evidence of intravascular and/or intraneural injection Following insertion, dressing applied and Biopatch. Post procedure assessment: normal and unchanged  Patient tolerated the procedure well with no immediate complications.

## 2018-12-30 NOTE — Progress Notes (Signed)
  Echocardiogram Echocardiogram Transesophageal has been performed.  Carl [name] 12/30/2018, 11:04 AM

## 2018-12-31 ENCOUNTER — Encounter (HOSPITAL_COMMUNITY): Payer: Self-pay | Admitting: Cardiovascular Disease

## 2018-12-31 ENCOUNTER — Inpatient Hospital Stay (HOSPITAL_COMMUNITY): Payer: Medicare Other

## 2018-12-31 DIAGNOSIS — I5021 Acute systolic (congestive) heart failure: Secondary | ICD-10-CM

## 2018-12-31 DIAGNOSIS — Z006 Encounter for examination for normal comparison and control in clinical research program: Secondary | ICD-10-CM | POA: Diagnosis not present

## 2018-12-31 DIAGNOSIS — I361 Nonrheumatic tricuspid (valve) insufficiency: Secondary | ICD-10-CM | POA: Diagnosis not present

## 2018-12-31 DIAGNOSIS — Z9889 Other specified postprocedural states: Secondary | ICD-10-CM

## 2018-12-31 DIAGNOSIS — I371 Nonrheumatic pulmonary valve insufficiency: Secondary | ICD-10-CM

## 2018-12-31 DIAGNOSIS — E43 Unspecified severe protein-calorie malnutrition: Secondary | ICD-10-CM | POA: Diagnosis not present

## 2018-12-31 DIAGNOSIS — I340 Nonrheumatic mitral (valve) insufficiency: Principal | ICD-10-CM

## 2018-12-31 DIAGNOSIS — Q211 Atrial septal defect: Secondary | ICD-10-CM | POA: Diagnosis not present

## 2018-12-31 DIAGNOSIS — Z1159 Encounter for screening for other viral diseases: Secondary | ICD-10-CM | POA: Diagnosis not present

## 2018-12-31 LAB — CBC
HCT: 34.3 % — ABNORMAL LOW (ref 39.0–52.0)
Hemoglobin: 11.7 g/dL — ABNORMAL LOW (ref 13.0–17.0)
MCH: 32.9 pg (ref 26.0–34.0)
MCHC: 34.1 g/dL (ref 30.0–36.0)
MCV: 96.3 fL (ref 80.0–100.0)
Platelets: 164 10*3/uL (ref 150–400)
RBC: 3.56 MIL/uL — ABNORMAL LOW (ref 4.22–5.81)
RDW: 17 % — ABNORMAL HIGH (ref 11.5–15.5)
WBC: 4.7 10*3/uL (ref 4.0–10.5)
nRBC: 0 % (ref 0.0–0.2)

## 2018-12-31 LAB — BASIC METABOLIC PANEL
Anion gap: 14 (ref 5–15)
BUN: 49 mg/dL — ABNORMAL HIGH (ref 8–23)
CO2: 23 mmol/L (ref 22–32)
Calcium: 8.6 mg/dL — ABNORMAL LOW (ref 8.9–10.3)
Chloride: 91 mmol/L — ABNORMAL LOW (ref 98–111)
Creatinine, Ser: 2.27 mg/dL — ABNORMAL HIGH (ref 0.61–1.24)
GFR calc Af Amer: 33 mL/min — ABNORMAL LOW (ref >60)
GFR calc non Af Amer: 29 mL/min — ABNORMAL LOW (ref >60)
Glucose, Bld: 156 mg/dL — ABNORMAL HIGH (ref 70–99)
Potassium: 3.7 mmol/L (ref 3.5–5.1)
Sodium: 128 mmol/L — ABNORMAL LOW (ref 135–145)

## 2018-12-31 LAB — ECHOCARDIOGRAM COMPLETE
Height: 72 in
Weight: 2335.99 oz

## 2018-12-31 MED ORDER — PANTOPRAZOLE SODIUM 40 MG PO TBEC: 40.0000 mg | DELAYED_RELEASE_TABLET | Freq: Every day | ORAL | 0 refills | Status: DC

## 2018-12-31 MED ORDER — CLOPIDOGREL BISULFATE 75 MG PO TABS: 75.0000 mg | ORAL_TABLET | Freq: Every day | ORAL | 0 refills | Status: DC

## 2018-12-31 NOTE — Progress Notes (Signed)
Cardiac Rehab Advisory Cardiac Rehab Phase I is not seeing pts face to face at this time due to Covid 19 restrictions. Ambulation is occurring through nursing, PT, and mobility teams. We will help facilitate that process as needed. We are calling pts in their rooms and discussing education. We will then deliver education materials to pts RN for delivery to pt.   Spoke with pt by phone. Discussed daily wts, low sodium diet, restrictions for groin, and walking as tolerated at home. He sts he has help from his brother, sister, and apparently [facility] services. He is not interested in CRPII. Encouraged him to ambulate with RN this am. Spoke with RN as well, she is planning to walk with him soon. I will deliver RN his educational materials. [place] CES, ACSM 9:17 AM 12/31/2018

## 2018-12-31 NOTE — Discharge Instructions (Signed)
Your prilosec was changed to protonix given a possible interaction with plavix. You can resume prilosec when you stop plavix after 3 months.   Groin Site Care Refer to this sheet in the next few weeks. These instructions provide you with information on caring for yourself after your procedure. Your caregiver may also give you more specific instructions. Your treatment has been planned according to current medical practices, but problems sometimes occur. Call your caregiver if you have any problems or questions after your procedure. HOME CARE INSTRUCTIONS  You may shower 24 hours after the procedure. Remove the bandage (dressing) and gently wash the site with plain soap and water. Gently pat the site dry.   Do not apply powder or lotion to the site.   Do not sit in a bathtub, swimming pool, or whirlpool for 5 to 7 days.   No bending, squatting, or lifting anything over 10 pounds (4.5 kg) as directed by your caregiver.   Inspect the site at least twice daily.   Do not drive home if you are discharged the same day of the procedure. Have someone else drive you.   You may drive 24 hours after the procedure unless otherwise instructed by your caregiver.  What to expect:  Any bruising will usually fade within 1 to 2 weeks.   Blood that collects in the tissue (hematoma) may be painful to the touch. It should usually decrease in size and tenderness within 1 to 2 weeks.  SEEK IMMEDIATE MEDICAL CARE IF:  You have unusual pain at the groin site or down the affected leg.   You have redness, warmth, swelling, or pain at the groin site.   You have drainage (other than a small amount of blood on the dressing).   You have chills.   You have a fever or persistent symptoms for more than 72 hours.   You have a fever and your symptoms suddenly get worse.   Your leg becomes pale, cool, tingly, or numb.  You have heavy bleeding from the site. Hold pressure on the site.  If you have any questions  or concerns you can call the structural heart phone during normal business hours 8am-4pm. If you have an urgent need after hours or weekends please call [phone] to talk to the on call provider for general cardiology. If you have an emergency that requires immediate attention, please call 911.   After MitraClip Checklist  Check  Test Description   Follow up appointment in 1-2 weeks  Most of our patients will see our structural heart physician assistant, Nell Range or your primary cardiologist within 1-2 weeks. Your incision site will be checked and you will be cleared to drive and resume all normal activities if you are doing well.     1 month echo and follow up  You will have an echo to check on your heart valve clip and be seen back in the office by Nell Range. Many times the echo is not read by your appointment time, but Joellen Jersey will call you later that day or the following day to report your results.   Follow up with your primary cardiologist You will need to be seen by your primary cardiologist in the following 3-6 months after your 1 month appointment in the valve clinic. Often times your Plavix or Aspirin will be discontinued during this time, but this is decided on a case by case basis.    1 year echo and follow up You will have another echo  to check on your heart valve after 1 year and be seen back in the office by Nell Range. This your last structural heart visit.   Bacterial endocarditis prophylaxis  You will have to take antibiotics for the rest of your life before all dental procedures (even teeth cleanings) to protect your heart valve. Antibiotics are also required before some surgeries. Please check with your cardiologist before scheduling any surgeries. Also, please make sure to tell us if you have a penicillin allergy as you will require an alternative antibiotic.      HEART AND VASCULAR CENTER   MULTIDISCIPLINARY HEART VALVE TEAM   YOUR CARDIOLOGY TEAM HAS ARRANGED  FOR AN E-VISIT FOR YOUR APPOINTMENT - PLEASE REVIEW IMPORTANT INFORMATION BELOW SEVERAL DAYS PRIOR TO YOUR APPOINTMENT  Due to the recent COVID-19 pandemic, we are transitioning in-person office visits to tele-medicine visits in an effort to decrease unnecessary exposure to our patients, their families, and staff. These visits are billed to your insurance just like a normal visit is. We also encourage you to sign up for MyChart if you have not already done so. You will need a smartphone if possible. For patients that do not have this, we can still complete the visit using a regular telephone but do prefer a smartphone to enable video when possible. You may have a family member that lives with you that can help. If possible, we also ask that you have a blood pressure cuff and scale at home to measure your blood pressure, heart rate and weight prior to your scheduled appointment. Patients with clinical needs that need an in-person evaluation and testing will still be able to come to the office if absolutely necessary. If you have any questions, feel free to call our office.    YOUR PROVIDER WILL BE USING THE FOLLOWING PLATFORM TO COMPLETE YOUR VISIT: [email or web address] All you need is a smart phone. You will receive a text message from the provider through the [email or web address] app. You will follow the prompts and it will take you to a virtual visit with your provider. Please check your blood pressure, heart rate and weight prior to your scheduled appointment.  CONSENT FOR TELE-HEALTH VISIT - PLEASE REVIEW  I hereby voluntarily request, consent and authorize [facility] and its employed or contracted physicians, physician assistants, nurse practitioners or other licensed health care professionals (the Practitioner), to provide me with telemedicine health care services (the Services") as deemed necessary by the treating Practitioner. I acknowledge and consent to receive the Services by the Practitioner via telemedicine. I  understand that the telemedicine visit will involve communicating with the Practitioner through live audiovisual communication technology and the disclosure of certain medical information by electronic transmission. I acknowledge that I have been given the opportunity to request an in-person assessment or other available alternative prior to the telemedicine visit and am voluntarily participating in the telemedicine visit.  I understand that I have the right to withhold or withdraw my consent to the use of telemedicine in the course of my care at any time, without affecting my right to future care or treatment, and that the Practitioner or I may terminate the telemedicine visit at any time. I understand that I have the right to inspect all information obtained and/or recorded in the course of the telemedicine visit and may receive copies of available information for a reasonable fee.  I understand that some of the potential risks of receiving the Services via telemedicine include:   Delay or interruption in medical  evaluation due to technological equipment failure or disruption;  Information transmitted may not be sufficient (e.g. poor resolution of images) to allow for appropriate medical decision making by the Practitioner; and/or   In rare instances, security protocols could fail, causing a breach of personal health information.  Furthermore, I acknowledge that it is my responsibility to provide information about my medical history, conditions and care that is complete and accurate to the best of my ability. I acknowledge that Practitioner's advice, recommendations, and/or decision may be based on factors not within their control, such as incomplete or inaccurate data provided by me or distortions of diagnostic images or specimens that may result from electronic transmissions. I understand that the practice of medicine is not an exact science and that Practitioner makes no warranties or guarantees  regarding treatment outcomes. I acknowledge that I will receive a copy of this consent concurrently upon execution via email to the email address I last provided but may also request a printed copy by calling the office of [facility].    I understand that my insurance will be billed for this visit.   I have read or had this consent read to me.  I understand the contents of this consent, which adequately explains the benefits and risks of the Services being provided via telemedicine.   I have been provided ample opportunity to ask questions regarding this consent and the Services and have had my questions answered to my satisfaction.  I give my informed consent for the services to be provided through the use of telemedicine in my medical care  By participating in this telemedicine visit I agree to the above.

## 2018-12-31 NOTE — Progress Notes (Signed)
Pt got discharged to home, discharge instructions provided and patient showed understanding to it, IV taken out,Telemonitor DC,pt left unit in wheelchair with all of the belongings accompanied with a family member (Sister)  Moiz Ryant, RN 

## 2018-12-31 NOTE — TOC Transition Note (Addendum)
Transition of Care [facility]) - CM/SW Discharge Note   Patient Details  Name: Carl [name] MRN: [id number] Date of Birth: 1951/05/03  Transition of Care [facility]) CM/SW Contact:  Maryclare Labrador, RN Phone Number: 12/31/2018, 11:12 AM   Clinical Narrative:   Pt will discharge home today in care of family - brother will transport home in private vehicle.  Pt has PCP and brother denied barriers with paying for medications.  Pt has already been set up with outpt therapy by PCP as he recently transitioned from [facility] to outpt therapy.  Pt is also active with paramedicine program.  No CM needs identified - Cm signing off    Final next level of care: Home/Self Care Barriers to Discharge: Barriers Resolved   Patient Goals and CMS Choice        Discharge Placement                       Discharge Plan and Services                                     Social Determinants of Health (SDOH) Interventions     Readmission Risk Interventions No flowsheet data found.

## 2018-12-31 NOTE — Discharge Summary (Addendum)
[facility] VALVE TEAM  Discharge Summary    Patient ID: RIELY BASKETT MRN: [id number]; DOB: August 23, 1951  Admit date: 12/30/2018 Discharge date: 12/31/2018  Primary Care Provider: [name], MD  Primary Cardiologist: Dr. [name]   Discharge Diagnoses    Principal Problem:   S/P mitral valve repair Active Problems:   Protein-calorie malnutrition, severe ([place])   AKI (acute kidney injury) ([place])   S/P CABG x 5   Acute on chronic systolic CHF (congestive heart failure) ([place])   Difficulty hearing   Cardiomyopathy, ischemic   Severe mitral insufficiency   Hyperlipidemia   Essential hypertension   CAD (coronary artery disease)   GERD (gastroesophageal reflux disease)   CKD (chronic kidney disease), stage III ([facility])   Allergies Allergies  Allergen Reactions  . Spironolactone Other (See Comments)    gynecomastia     Diagnostic Studies/Procedures    12/30/18 ATRIAL SEPTAL DEFECT(ASD) CLOSURE  MITRAL VALVE REPAIR  Conclusion  1.  Successful percutaneous mitral valve repair using 2 and TR MitraClip devices, both devices placed A2/P2 2.  Successful transcatheter ASD closure using a 10 mm Amplatzer atrial septal occluder device Continue ASA and Clopidogrel at least 3 months without interruption   _____________   Echo 12/31/18: pending formal read at the time of discharge  History of Present Illness     OPAL MCKELLIPS is a 68 y.o. male with a history of tobacco abuse, mild retardation, CAD s/p CABG, chronic systolic CHF s/p ICD, CKD stage III, severe ischemic MR who presented to [facility] on 12/30/18 for planned MitraClip.   The patientinitiallypresented in 2015 with congestive heart failure and non-STEMI. He was found to have severe LV dysfunction with an LVEF of 15 to 25% and severe three-vessel coronary artery disease. He underwent 5 vessel CABG with a LIMA to LAD, saphenous vein graft to diagonal, sequential saphenous vein graft to OM 2  and OM 3, and saphenous vein graft to PDA.Left ventricular function did not improve much after surgery and he has subsequently undergone ICD implantation. He has a history of moderate mitral regurgitation in the past, but on more recent echocardiogram studies he has been found to have severe mitral regurgitation. He underwent a TEE in 2019 demonstrating severe mitral regurgitation with annular dilatation and posterior leaflet restriction.  He was seen by Dr. Burt Knack in consultation in June 2019 for consideration of percutaneous mitral valve repair.  At that time he was noted to be on a maximally tolerated heart failure regimen with ongoing New York Heart Association functional class II symptoms of chronic systolic heart failure and severe mitral insufficiency.  We elected to continue with close follow-up in the setting of stable symptoms.  The patient has chronic problems with back and leg pain.  He has mostly ambulated with a walker over time after sustaining a severe leg injury many years ago.  The patient has mild mental retardation but has functioned independently.   He was recently seen for progressive worsening of dyspnea on exertion with NYHA class III symptoms. His thoracic impedance numbers have continued to worsen despite close outpatient care in the advanced heart failure clinic.  He was seen back by Dr. Burt Knack on 4/20 via virtual consultation and it was decided to proceed with [facility] and then MitraClip. R/LHC on 12/21/18 showed severe native CAD with continued patency of bypass grafts and large V waves c/w severe MR.   MitraClip was set up for 12/30/18. Pre op labs showed AKI  with creat 1.42--> 2.13. His Losartan was discontinued and his lasix was decreased from 80mg  BID to 80mg  daily.    Hospital Course     Consultants: none   Severe MR: s/p successful percutaneous mitral valve repair using 2 and TR MitraClip devices, both devices placed A2/P2. Procedure complicated by iatrogenic ASD with  R--> L flow. Post op echo completed but pending formal read at the time of discharge. Groin site with no hematoma or ecchymosis. Continue Aspirin and plavix x3 months. He will be seen in the advanced heart failure clinic by Dr. [name] on 5/19. 1 month echo and follow up will be arranged with Dr. Burt Knack in the office.   Iatrogenic ASD: there was evidence of R--> L shunting through transseptal punture with concomitant systemic 02 desaturations. He is now s/p successful transcatheter ASD closure using a 10 mm Amplatzer atrial septal occluder device.   Acute on chronic combined S/D CHF: as evidenced by a significantly elevated BNP >1800 on pre op admission labs. Losartan was discontinued and Lasix was reduced given AKI with creat up to 2.14. Plan to resume lasix 80mg  daily and Eplerenone 25 mg daily.  AKI: creat peaked at 2.49 on 12/30/18. Now trending downwards to 2.27. Plan to resume lasix 80mg  daily.Continue to hold Losartan. He will see Dr. [name] on 5/19 with BMET at that appointment.   CAD: pre clip cath with patent bypass grafts. Continue medical therapy.   GERD: prilosec switched to protonix given potential drug drug interaction with plavix.  _____________  Discharge Vitals Blood pressure 100/70, pulse 62, temperature 97.8 F (36.6 C), temperature source Oral, resp. rate 16, height 6' (1.829 m), weight 66.2 kg, SpO2 91 %.  Filed Weights   12/30/18 0622  Weight: 66.2 kg    Labs & Radiologic Studies    CBC Recent Labs    12/30/18 1350 12/31/18 0143  WBC 6.8 4.7  HGB 11.6* 11.7*  HCT 34.4* 34.3*  MCV 97.7 96.3  PLT 150 546   Basic Metabolic Panel Recent Labs    12/30/18 0652 12/30/18 1350 12/31/18 0143  NA 127*  --  128*  K 3.8  --  3.7  CL 88*  --  91*  CO2 23  --  23  GLUCOSE 113*  --  156*  BUN 50*  --  49*  CREATININE 2.49* 2.42* 2.27*  CALCIUM 9.4  --  8.6*   Liver Function Tests No results for input(s): AST, ALT, ALKPHOS, BILITOT, PROT, ALBUMIN in the last 72  hours. No results for input(s): LIPASE, AMYLASE in the last 72 hours. Cardiac Enzymes No results for input(s): CKTOTAL, CKMB, CKMBINDEX, TROPONINI in the last 72 hours. BNP Invalid input(s): POCBNP D-Dimer No results for input(s): DDIMER in the last 72 hours. Hemoglobin A1C No results for input(s): HGBA1C in the last 72 hours. Fasting Lipid Panel No results for input(s): CHOL, HDL, LDLCALC, TRIG, CHOLHDL, LDLDIRECT in the last 72 hours. Thyroid Function Tests No results for input(s): TSH, T4TOTAL, T3FREE, THYROIDAB in the last 72 hours.  Invalid input(s): FREET3 _____________  Dg Chest 2 View  Result Date: 12/27/2018 CLINICAL DATA:  Preop for mitral valve replacement. EXAM: CHEST - 2 VIEW COMPARISON:  Radiographs of February 14, 2018. FINDINGS: Stable cardiomegaly. Status post coronary artery bypass graft. Stable position of single lead left-sided pacemaker. No pneumothorax or pleural effusion is noted. No acute pulmonary disease is noted. Old right rib fractures are noted. IMPRESSION: No active cardiopulmonary disease. Electronically Signed   By: Jeneen Rinks  Murlean Caller M.D.   On: 12/27/2018 11:40   Disposition   Pt is being discharged home today in good condition.  Follow-up Plans & Appointments    Follow-up Information    [name], MD. Go on 01/11/2019.   Specialty:  Cardiology Why:  2:00 PM, parking code [place] information: [place] [address] [phone]            Discharge Medications   Allergies as of 12/31/2018      Reactions   Spironolactone Other (See Comments)   gynecomastia       Medication List    STOP taking these medications   omeprazole 20 MG capsule Commonly known as:  PRILOSEC Replaced by:  pantoprazole 40 MG tablet     TAKE these medications   acetaminophen 325 MG tablet Commonly known as:  TYLENOL Take 325 mg by mouth every 6 (six) hours as needed for mild pain or moderate pain.   aspirin EC 81 MG tablet Take 1  tablet (81 mg total) by mouth daily.   atorvastatin 40 MG tablet Commonly known as:  LIPITOR Take 1 tablet (40 mg total) by mouth daily at 6 PM.   carvedilol 3.125 MG tablet Commonly known as:  COREG Take 1 tablet (3.125 mg total) by mouth 2 (two) times daily.   citalopram 20 MG tablet Commonly known as:  CELEXA Take 20 mg by mouth daily.   clopidogrel 75 MG tablet Commonly known as:  PLAVIX Take 1 tablet (75 mg total) by mouth daily with breakfast. Start taking on:  Jan 01, 2019   Dr Darliss Ridgel Diaper 10 % Oint Generic drug:  Zinc Oxide Apply 1 application topically daily as needed (IRRITATION).   eplerenone 25 MG tablet Commonly known as:  INSPRA Take 1 tablet (25 mg total) by mouth daily.   ferrous sulfate 325 (65 FE) MG tablet Take 1 tablet (325 mg total) by mouth daily with breakfast.   furosemide 40 MG tablet Commonly known as:  LASIX Take 1 tablet (40 mg total) by mouth 2 (two) times daily.   multivitamin tablet Take 1 tablet by mouth daily.   pantoprazole 40 MG tablet Commonly known as:  PROTONIX Take 1 tablet (40 mg total) by mouth daily. Start taking on:  Jan 01, 2019 Replaces:  omeprazole 20 MG capsule   vitamin B-12 500 MCG tablet Commonly known as:  CYANOCOBALAMIN Take 500 mcg by mouth daily.   vitamin C 100 MG tablet Take 100 mg by mouth daily.   Vitamin D3 Super Strength 50 MCG (2000 UT) Tabs Generic drug:  Cholecalciferol Take 2,000 Units by mouth daily.         Outstanding Labs/Studies   BMET  Duration of Discharge Encounter   Greater than 30 minutes including physician time.  Mable Fill, PA-C 12/31/2018, 10:36 AM [phone]  Patient seen, examined. Available data reviewed. Agree with findings, assessment, and plan as outlined by Nell Range, PA-C. On my exam today: Vitals:   12/31/18 0800 12/31/18 0900  BP:    Pulse: (!) 103 62  Resp: 16 16  Temp:    SpO2: 94% 91%   Pt is alert and oriented, NAD HEENT:  normal Neck: JVP - large v waves to the angle of the mandible Lungs: CTA bilaterally CV: RRR with distant heart sounds Abd: soft, NT, Positive BS, no hepatomegaly Ext: trace edema right leg, distal pulses intact and equal, right groin site clear, mild ecchymosis Skin: warm/dry no rash  Echo images reviewed and shows no more than 1+ residual MR. ASO device in place. Pt appears stable for DC home today. Hold losartan at DC. FU closely in HF clinic with Dr [name]. Will send home on lasix 80 mg daily. Continue ASA and clopidogrel.  [name], M.D. 12/31/2018 10:38 AM

## 2018-12-31 NOTE — Progress Notes (Signed)
Echocardiogram 2D Echocardiogram has been performed.  Carl [name] 12/31/2018, 8:39 AM

## 2019-01-03 ENCOUNTER — Other Ambulatory Visit: Payer: Self-pay

## 2019-01-03 ENCOUNTER — Telehealth: Payer: Self-pay | Admitting: Physician Assistant

## 2019-01-03 ENCOUNTER — Ambulatory Visit (INDEPENDENT_AMBULATORY_CARE_PROVIDER_SITE_OTHER): Payer: Medicare Other | Admitting: *Deleted

## 2019-01-03 DIAGNOSIS — I255 Ischemic cardiomyopathy: Secondary | ICD-10-CM

## 2019-01-03 DIAGNOSIS — I5022 Chronic systolic (congestive) heart failure: Secondary | ICD-10-CM

## 2019-01-03 NOTE — Telephone Encounter (Signed)
  [facility] VALVE TEAM   Patient contacted regarding discharge from [facility] on 12/31/2018. I spoke with his brother, Carl [name]. He says that Carl [name] seems to be doing well with no complaints.   Patient/ brother understand to follow up with provider Dr. [name] 5/19 @ 2pm in the heart and vascular center.  Patient/ brother understand discharge instructions? yes Patient/ brother understand medications and regiment? yes Patient understands to bring all medications to this visit? yes  Angelena Form PA-C  [facility]

## 2019-01-04 ENCOUNTER — Other Ambulatory Visit (HOSPITAL_COMMUNITY): Payer: Self-pay | Admitting: *Deleted

## 2019-01-04 ENCOUNTER — Encounter (HOSPITAL_COMMUNITY): Payer: Self-pay

## 2019-01-04 ENCOUNTER — Other Ambulatory Visit (HOSPITAL_COMMUNITY): Payer: Self-pay

## 2019-01-04 LAB — CUP PACEART REMOTE DEVICE CHECK
Battery Remaining Longevity: 109 mo
Battery Voltage: 3 V
Brady Statistic RV Percent Paced: 1.38 %
Date Time Interrogation Session: 20200511052504
HighPow Impedance: 69 Ohm
Implantable Lead Implant Date: 20160906
Implantable Lead Location: 753860
Implantable Pulse Generator Implant Date: 20160906
Lead Channel Impedance Value: 323 Ohm
Lead Channel Impedance Value: 399 Ohm
Lead Channel Pacing Threshold Amplitude: 0.75 V
Lead Channel Pacing Threshold Pulse Width: 0.4 ms
Lead Channel Sensing Intrinsic Amplitude: 10.25 mV
Lead Channel Sensing Intrinsic Amplitude: 10.25 mV
Lead Channel Setting Pacing Amplitude: 2.5 V
Lead Channel Setting Pacing Pulse Width: 0.4 ms
Lead Channel Setting Sensing Sensitivity: 0.3 mV

## 2019-01-04 MED ORDER — ATORVASTATIN CALCIUM 40 MG PO TABS: 40.0000 mg | ORAL_TABLET | Freq: Every day | ORAL | 3 refills | Status: DC

## 2019-01-04 NOTE — Progress Notes (Signed)
Today was a visit with Carl [name] at home.  He had surgery last week and some medication changes.  His brother Carl [name] had placed some of his medications, verified his meds and refilled his med boxes.  Carl [name] states feeling better, tired due to he got up early for doctors appt this am.  Swelling is down, still has some in ankle area.  He states legs dont hurt as bad now.  He denies chest pain, shortness of breath, dizziness or headaches.  Denies belly feeling tight.  He appears to be taking his medications.  Called in refill for atorvastatin to [facility] [place], she states will send it in.  Carl [name] is ambulating with walker, he has nurse still coming out but no PT at the time.  Carl [name] his brother asked about cardiac rehab to be done in [place] instead of [place] due to closer, he will ask [name] at up coming visit.  Lungs are clear, appears to be doing well after last week procedure.  He is weighing everyday, he is eating the Moms meals except for this am, his sister got him a bixcuit and tea.  He states has been drinking water with [employer]. He states Carl [name] is going to bring his bicycle over and he is going to start riding it some for exercise.  He appears to have more energy than before.  Will continue to visit for heart failure, diet and medication compliance.   [place] [phone]

## 2019-01-10 ENCOUNTER — Other Ambulatory Visit: Payer: Self-pay

## 2019-01-10 ENCOUNTER — Ambulatory Visit (INDEPENDENT_AMBULATORY_CARE_PROVIDER_SITE_OTHER): Payer: Medicare Other

## 2019-01-10 ENCOUNTER — Other Ambulatory Visit (HOSPITAL_COMMUNITY): Payer: Self-pay | Admitting: *Deleted

## 2019-01-10 DIAGNOSIS — I5022 Chronic systolic (congestive) heart failure: Secondary | ICD-10-CM

## 2019-01-10 DIAGNOSIS — Z95810 Presence of automatic (implantable) cardiac defibrillator: Secondary | ICD-10-CM

## 2019-01-10 MED ORDER — ATORVASTATIN CALCIUM 40 MG PO TABS: 40.0000 mg | ORAL_TABLET | Freq: Every day | ORAL | 3 refills | Status: DC

## 2019-01-10 NOTE — Progress Notes (Signed)
EPIC Encounter for ICM Monitoring  Patient Name: Carl [name] is a 68 y.o. male Date: 01/10/2019 Primary Care Physican: [name], MD Primary Cardiologist:[place] Electrophysiologist: Allred Last Weight:163lbs(11/11/2018 office visit) 12/13/2018 Weight: unknown   Transmission reviewed.  Patient has in office visit tomorrow with Dr [name] and recommendations will be given at that time.   Optivol Thoracic impedanceabnormalsuggesting fluid accumulation since 01/01/2019.  Prescribed:Furosemide 20 mgtake3 tablets (60 mg total) by mouth twice a day.  Labs: 12/31/2018 Creatinine 2.27, BUN 49, Potassium 3.7, Sodium 128, GFR 29-33 12/30/2018 Creatinine 2.42, BUN 50, Potassium 3.8, Sodium 127, GFR 27-31  12/27/2018 Creatinine 2.13, BUN 42, Potassium 3.7, Sodium 129, GFR 31-36  12/21/2018 Creatinine 1.42, BUN 37, Potassium 3.9, Sodium 131, GFR 51-59  11/23/2018 Creatinine 1.75, BUN 40, Potassium 4.2, Sodium 137, GFR 39.32-45.57 11/11/2018 Creatinine 1.45, BUN 15, Potassium 3.6, Sodium 136, GFR 49-57 10/16/2018 Creatinine2.01, BUN47, Potassium4.5, Sodium134, GFR33-39 10/07/2018 Creatinine1.44, BUN22, Potassium4.6, Sodium134, GFR50-58 A complete set of results can be found in Results Review  Recommendations: Recommendations will be given at office visit 01/11/2019 by Dr [name] if needed.  Follow-up plan: ICM clinic phone appointment on5/26/2020 to recheck fluid levels. Office visit scheduled 01/11/2019 with Dr [name].  Copy of ICM check sent to Dr.Allredand Dr [name].    3 month ICM trend: 01/10/2019    1 Year ICM trend:       Rosalene Billings, RN 01/10/2019 1:50 PM

## 2019-01-10 NOTE — Progress Notes (Signed)
Remote ICD transmission.   

## 2019-01-11 ENCOUNTER — Other Ambulatory Visit: Payer: Self-pay

## 2019-01-11 ENCOUNTER — Ambulatory Visit (HOSPITAL_COMMUNITY)
Admission: RE | Admit: 2019-01-11 | Discharge: 2019-01-11 | Disposition: A | Discharge status: Home / Self Care | Payer: Medicare Other | Source: Ambulatory Visit | Attending: Cardiology | Admitting: Cardiology

## 2019-01-11 VITALS — BP 112/64 | HR 74 | Wt 158.2 lb

## 2019-01-11 DIAGNOSIS — Z7982 Long term (current) use of aspirin: Secondary | ICD-10-CM | POA: Insufficient documentation

## 2019-01-11 DIAGNOSIS — I5022 Chronic systolic (congestive) heart failure: Secondary | ICD-10-CM | POA: Insufficient documentation

## 2019-01-11 DIAGNOSIS — N183 Chronic kidney disease, stage 3 (moderate): Secondary | ICD-10-CM | POA: Diagnosis not present

## 2019-01-11 DIAGNOSIS — Z951 Presence of aortocoronary bypass graft: Secondary | ICD-10-CM | POA: Diagnosis not present

## 2019-01-11 DIAGNOSIS — Z79899 Other long term (current) drug therapy: Secondary | ICD-10-CM | POA: Diagnosis not present

## 2019-01-11 DIAGNOSIS — F329 Major depressive disorder, single episode, unspecified: Secondary | ICD-10-CM | POA: Diagnosis not present

## 2019-01-11 DIAGNOSIS — Z7902 Long term (current) use of antithrombotics/antiplatelets: Secondary | ICD-10-CM | POA: Insufficient documentation

## 2019-01-11 DIAGNOSIS — I25810 Atherosclerosis of coronary artery bypass graft(s) without angina pectoris: Secondary | ICD-10-CM | POA: Diagnosis not present

## 2019-01-11 DIAGNOSIS — R9431 Abnormal electrocardiogram [ECG] [EKG]: Secondary | ICD-10-CM | POA: Insufficient documentation

## 2019-01-11 DIAGNOSIS — I255 Ischemic cardiomyopathy: Secondary | ICD-10-CM | POA: Insufficient documentation

## 2019-01-11 DIAGNOSIS — I4510 Unspecified right bundle-branch block: Secondary | ICD-10-CM | POA: Insufficient documentation

## 2019-01-11 DIAGNOSIS — Z8249 Family history of ischemic heart disease and other diseases of the circulatory system: Secondary | ICD-10-CM | POA: Insufficient documentation

## 2019-01-11 DIAGNOSIS — I340 Nonrheumatic mitral (valve) insufficiency: Secondary | ICD-10-CM

## 2019-01-11 DIAGNOSIS — Z87891 Personal history of nicotine dependence: Secondary | ICD-10-CM | POA: Insufficient documentation

## 2019-01-11 DIAGNOSIS — F70 Mild intellectual disabilities: Secondary | ICD-10-CM | POA: Insufficient documentation

## 2019-01-11 DIAGNOSIS — Z833 Family history of diabetes mellitus: Secondary | ICD-10-CM | POA: Insufficient documentation

## 2019-01-11 LAB — BASIC METABOLIC PANEL
Anion gap: 10 (ref 5–15)
BUN: 20 mg/dL (ref 8–23)
CO2: 25 mmol/L (ref 22–32)
Calcium: 8.6 mg/dL — ABNORMAL LOW (ref 8.9–10.3)
Chloride: 97 mmol/L — ABNORMAL LOW (ref 98–111)
Creatinine, Ser: 1.46 mg/dL — ABNORMAL HIGH (ref 0.61–1.24)
GFR calc Af Amer: 57 mL/min — ABNORMAL LOW (ref >60)
GFR calc non Af Amer: 49 mL/min — ABNORMAL LOW (ref >60)
Glucose, Bld: 123 mg/dL — ABNORMAL HIGH (ref 70–99)
Potassium: 3.8 mmol/L (ref 3.5–5.1)
Sodium: 132 mmol/L — ABNORMAL LOW (ref 135–145)

## 2019-01-11 MED ORDER — TORSEMIDE 20 MG PO TABS: 60.0000 mg | ORAL_TABLET | Freq: Every day | ORAL | 6 refills | Status: DC

## 2019-01-11 NOTE — Progress Notes (Signed)
Called and spoke w/Kristi, community paramedic, she is aware of med changes and will see pt's tomorrow morning to make med changes in his pill box
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
## 2019-01-11 NOTE — Patient Instructions (Signed)
Stop Furosemide  Start Torsemide 60 mg (3 tabs) daily  Increase Eplerenone to 50 mg daily   Labs today  Follow up in 1-2 weeks

## 2019-01-12 ENCOUNTER — Encounter (HOSPITAL_COMMUNITY): Payer: Self-pay

## 2019-01-12 ENCOUNTER — Other Ambulatory Visit (HOSPITAL_COMMUNITY): Payer: Self-pay

## 2019-01-12 MED ORDER — EPLERENONE 50 MG PO TABS: 50.0000 mg | ORAL_TABLET | Freq: Every day | ORAL | 6 refills | Status: DC

## 2019-01-12 NOTE — Addendum Note (Signed)
Encounter addended by: Scarlette Calico, RN on: 01/12/2019 10:32 AM  Actions taken: Order list changed

## 2019-01-12 NOTE — Addendum Note (Signed)
Encounter addended by: Scarlette Calico, RN on: 01/12/2019 4:01 PM  Actions taken: Charge Capture section accepted

## 2019-01-12 NOTE — Progress Notes (Signed)
Came to visit Carl [name] today to fix his medications boxes due to change in his medications from his cardiology appt yesterday.  He states doing well this morning.  He did hold off taking his medications till I came today to start his new medications.  He has ate boiled eggs this am.  He states eating the Moms meals.  He states been drinking water but only 1 bottle a day.  Advised him he needs to drink more than that.  He has given up sodas and tea.  He is putting sugar flavored kool aid in his water and he likes that.  He has a little swelling in ankle area this am.  Lungs sounds clear.  Meds verified and refilled med box.  Advised [name] his brother that a new list is on the clip board with changes of his medications and how I have placed them in med box.  He denies chest pain, dizziness, shortness of breath or headaches.  He states his back hurts when laying in bed.  Advised him to try some pillows.  Will continue to visit for heart failure, medication compliance and diet.   [place] [phone]

## 2019-01-13 ENCOUNTER — Telehealth (HOSPITAL_COMMUNITY): Payer: Self-pay

## 2019-01-13 ENCOUNTER — Ambulatory Visit: Payer: Medicare Other | Admitting: Podiatry

## 2019-01-13 NOTE — Telephone Encounter (Signed)
Received call from [place], EMT-P stating that pt c/o leg swelling. No other complaints at this time. Spoke with the pt's brother (only number in chart and preferred communication) and he said he was on the way to bring the pt dinner. Said pt said he was having lower leg swelling but had been walking around. Endorses the patient's weight being down from yesterday.  Per Lillia Mountain NP-C continue current diuretic regime. Will follow up again tomorrow for wellness check. Advised pt's brother to have him elevate his legs. Gave brother warning signs/symptoms in order to call ems if needed. He verbalized agreement.

## 2019-01-13 NOTE — Telephone Encounter (Signed)
I was contacted by Carl [name] brother Carl [name] stating that his legs were swollen.  Contacted Carl [name] and he advised they are swollen under his knees to his ankles.  He states he has tried walking today but they are not going down.  He states his weight was 147.2 this am.  Decrease of 7 pounds from yesterday. Contacted HF clinic triage line and advised them that torsemide was started yesterday and Inspra was changed to 50 yesterday from 25.  She stated will inform Carl [name].  Carl [name].  [name] [phone]

## 2019-01-14 NOTE — Telephone Encounter (Signed)
Called and spoke to pt's brother. He said that the pt's leg swelling has gone down. The pt denies any symptoms at this time.

## 2019-01-18 ENCOUNTER — Ambulatory Visit (HOSPITAL_COMMUNITY)
Admission: RE | Admit: 2019-01-18 | Discharge: 2019-01-18 | Disposition: A | Discharge status: Home / Self Care | Payer: Medicare Other | Source: Ambulatory Visit | Attending: Cardiology | Admitting: Cardiology

## 2019-01-18 ENCOUNTER — Ambulatory Visit (INDEPENDENT_AMBULATORY_CARE_PROVIDER_SITE_OTHER): Payer: Medicare Other

## 2019-01-18 ENCOUNTER — Other Ambulatory Visit: Payer: Self-pay

## 2019-01-18 ENCOUNTER — Telehealth (HOSPITAL_COMMUNITY): Payer: Self-pay

## 2019-01-18 ENCOUNTER — Encounter (HOSPITAL_COMMUNITY): Payer: Self-pay

## 2019-01-18 ENCOUNTER — Telehealth (HOSPITAL_COMMUNITY): Payer: Self-pay | Admitting: Cardiology

## 2019-01-18 VITALS — BP 110/68 | HR 71 | Wt 150.5 lb

## 2019-01-18 DIAGNOSIS — I340 Nonrheumatic mitral (valve) insufficiency: Secondary | ICD-10-CM | POA: Insufficient documentation

## 2019-01-18 DIAGNOSIS — Z87891 Personal history of nicotine dependence: Secondary | ICD-10-CM | POA: Insufficient documentation

## 2019-01-18 DIAGNOSIS — I5022 Chronic systolic (congestive) heart failure: Secondary | ICD-10-CM

## 2019-01-18 DIAGNOSIS — Z95810 Presence of automatic (implantable) cardiac defibrillator: Secondary | ICD-10-CM

## 2019-01-18 DIAGNOSIS — Z8249 Family history of ischemic heart disease and other diseases of the circulatory system: Secondary | ICD-10-CM | POA: Insufficient documentation

## 2019-01-18 DIAGNOSIS — Z951 Presence of aortocoronary bypass graft: Secondary | ICD-10-CM | POA: Insufficient documentation

## 2019-01-18 DIAGNOSIS — F70 Mild intellectual disabilities: Secondary | ICD-10-CM | POA: Insufficient documentation

## 2019-01-18 DIAGNOSIS — F329 Major depressive disorder, single episode, unspecified: Secondary | ICD-10-CM | POA: Diagnosis not present

## 2019-01-18 DIAGNOSIS — N182 Chronic kidney disease, stage 2 (mild): Secondary | ICD-10-CM | POA: Diagnosis not present

## 2019-01-18 DIAGNOSIS — I255 Ischemic cardiomyopathy: Secondary | ICD-10-CM | POA: Insufficient documentation

## 2019-01-18 DIAGNOSIS — Z79899 Other long term (current) drug therapy: Secondary | ICD-10-CM | POA: Diagnosis not present

## 2019-01-18 DIAGNOSIS — Z833 Family history of diabetes mellitus: Secondary | ICD-10-CM | POA: Diagnosis not present

## 2019-01-18 DIAGNOSIS — Z7902 Long term (current) use of antithrombotics/antiplatelets: Secondary | ICD-10-CM | POA: Diagnosis not present

## 2019-01-18 DIAGNOSIS — N183 Chronic kidney disease, stage 3 (moderate): Secondary | ICD-10-CM | POA: Insufficient documentation

## 2019-01-18 DIAGNOSIS — R0602 Shortness of breath: Secondary | ICD-10-CM | POA: Diagnosis present

## 2019-01-18 DIAGNOSIS — I2510 Atherosclerotic heart disease of native coronary artery without angina pectoris: Secondary | ICD-10-CM | POA: Diagnosis not present

## 2019-01-18 DIAGNOSIS — Z7982 Long term (current) use of aspirin: Secondary | ICD-10-CM | POA: Insufficient documentation

## 2019-01-18 LAB — BASIC METABOLIC PANEL
Anion gap: 13 (ref 5–15)
BUN: 28 mg/dL — ABNORMAL HIGH (ref 8–23)
CO2: 27 mmol/L (ref 22–32)
Calcium: 9.7 mg/dL (ref 8.9–10.3)
Chloride: 96 mmol/L — ABNORMAL LOW (ref 98–111)
Creatinine, Ser: 1.28 mg/dL — ABNORMAL HIGH (ref 0.61–1.24)
GFR calc Af Amer: >60 mL/min (ref >60)
GFR calc non Af Amer: 58 mL/min — ABNORMAL LOW (ref >60)
Glucose, Bld: 102 mg/dL — ABNORMAL HIGH (ref 70–99)
Potassium: 3.2 mmol/L — ABNORMAL LOW (ref 3.5–5.1)
Sodium: 136 mmol/L (ref 135–145)

## 2019-01-18 LAB — BRAIN NATRIURETIC PEPTIDE: B Natriuretic Peptide: 1261 pg/mL — ABNORMAL HIGH (ref 0.0–100.0)

## 2019-01-18 MED ORDER — POTASSIUM CHLORIDE CRYS ER 20 MEQ PO TBCR: 20.0000 meq | EXTENDED_RELEASE_TABLET | Freq: Two times a day (BID) | ORAL | 3 refills | Status: DC

## 2019-01-18 MED ORDER — TORSEMIDE 20 MG PO TABS: ORAL_TABLET | ORAL | 6 refills | Status: DC
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
## 2019-01-18 NOTE — Telephone Encounter (Signed)
Notes recorded by Kerry Dory, CMA on 01/18/2019 at 2:44 PM EDT PT AWARE VIA BROTHER, REPEAT LABS 6/4 ------  Notes recorded by Darrick Grinder D, NP on 01/18/2019 at 12:41 PM EDT Potassium low. Start 20 meq potassium twice a day. Repeat BMET in 7 days.

## 2019-01-18 NOTE — Progress Notes (Signed)
EPIC Encounter for ICM Monitoring  Patient Name: Carl [name] is a 68 y.o. male Date: 01/18/2019 Primary Care Physican: [name], MD Primary Cardiologist:McLean Electrophysiologist: Allred Last Weight:150lbs(01/18/2019 office visit)        Transmission reviewed.  Patient had HF clinic office visit today and recommendations were given.  Optivol Thoracic impedanceabnormalsuggesting fluid accumulationsince 01/01/2019.  Prescribed:Furosemide 20 mgtake4 tablets (80 mg total) every morning and 40 mg every evening (increased at HF clinic OV 01/18/2019)  Labs: 01/18/2019 Creatinine 1.28, BUN 28, Potassium 3.2, Sodium 136, GFR 58->60, BNP 1261.0 01/11/2019 Creatinine 1.46, BUN 20, Potassium 3.8, Sodium 132, GFR 49-57 12/31/2018 Creatinine 2.27, BUN 49, Potassium 3.7, Sodium 128, GFR 29-33 12/30/2018 Creatinine 2.42, BUN 50, Potassium 3.8, Sodium 127, GFR 27-31  12/27/2018 Creatinine 2.13, BUN 42, Potassium 3.7, Sodium 129, GFR 31-36, BNP 1846.8 12/21/2018 Creatinine 1.42, BUN 37, Potassium 3.9, Sodium 131, GFR 51-59  11/23/2018 Creatinine 1.75, BUN 40, Potassium 4.2, Sodium 137, GFR 39.32-45.57 11/11/2018 Creatinine 1.45, BUN 15, Potassium 3.6, Sodium 136, GFR 49-57 10/16/2018 Creatinine2.01, BUN47, Potassium4.5, Sodium134, GFR33-39 10/07/2018 Creatinine1.44, BUN22, Potassium4.6, Sodium134, GFR50-58 A complete set of results can be found in Results Review  Recommendations: Recommendations will be given at office visit 01/11/2019 by Dr [name] if needed.  Follow-up plan: ICM clinic phone appointment on6/9/2020to recheck fluid levels. Office visit scheduled 01/18/2019 with Darrick Grinder, NP  Copy of ICM check sent to Dr.Allredand Darrick Grinder, NP HF clinic.   3 month ICM trend: 01/18/2019    1 Year ICM trend:       Rosalene Billings, RN 01/18/2019 2:32 PM

## 2019-01-18 NOTE — Telephone Encounter (Signed)
Had spoke to Kiandre this am and he advised his appt was at 2: 00 pm today.  Epic showed 10: 30, contacted heart failure to verify and is was a 10: 30.  After talking to HF clinic and [name] his brother they rescheduled appt for 2:00 today due to transportation was scheduled.  He had some swelling in lower legs end of last week, got a little better over the weekend per [name].    [place] [phone]

## 2019-01-18 NOTE — Patient Instructions (Signed)
INCREASE Torsemide to 80mg  (4 tablets) in the AM and 40mg  (2 tablets) in the PM.  Routine lab work today. Will notify you of abnormal results  Follow up in 2 weeks.

## 2019-01-19 ENCOUNTER — Other Ambulatory Visit (HOSPITAL_COMMUNITY): Payer: Self-pay

## 2019-01-19 ENCOUNTER — Encounter (HOSPITAL_COMMUNITY): Payer: Self-pay

## 2019-01-19 NOTE — Progress Notes (Signed)
Came to visit with [place] today to make changes in his med box.   He states feeling pretty good.  He still has some swelling in lower extremities and ankle area, plus 1 edema.  He states his legs and back is hurting this morning.  He has already ate and he just took his morning medications.  He denies chest pain or feeling full in abdomen area.  He states he is going to try to start riding his bike some.  He has been trying to stay active.  He states drinking water and eating the Moms meals.  Discussed healthy snacks.  Meds were verified and med box refilled.  Will continue to visit for heart failure, medication compliance and diet.   Carl [name] [phone]

## 2019-01-26 ENCOUNTER — Telehealth (HOSPITAL_COMMUNITY): Payer: Self-pay

## 2019-01-26 NOTE — Telephone Encounter (Signed)
Today did a telephone visit with Carl [name].  He had not answered his phone yesterday to make appt to come to his house.  His only complaint today is his back hurting.  He states he has to sleep on his [name].  He also mentioned a crick in his neck.  Advised him to let his doctor know about it.  He states wants a back brace, advised him to let his doctor order him one if he needs one.  He keeps talking about his medicare will not buy him one.  Will discuss this with Carl [name].  His medications are in his medication box, he states he is taking them.  His weight has come down to 147 lbs.  He states swelling has come down but he still has some.  He denies chest pain, shortness of breath, headache or dizziness.  He states he has been riding his bicycle some and tries to walk.  He states he is tired all the time, he wants to come off of some of his medications.  Advised him the doctor will have to do that, to keep taking them.  He is taking tylenol for pain, did advise him it could make him a little tired.  He is aware of getting blood work done tomorrow.  Will watch to see if they change any medications, if so his med boxes will need changed.  Will continue to visit for Heart Failure, medication compliance and diet.   [place] [phone]

## 2019-01-27 ENCOUNTER — Encounter: Payer: Self-pay | Admitting: Podiatry

## 2019-01-27 ENCOUNTER — Other Ambulatory Visit: Payer: Self-pay

## 2019-01-27 ENCOUNTER — Ambulatory Visit (INDEPENDENT_AMBULATORY_CARE_PROVIDER_SITE_OTHER): Payer: Medicare Other | Admitting: Podiatry

## 2019-01-27 ENCOUNTER — Ambulatory Visit (HOSPITAL_COMMUNITY)
Admission: RE | Admit: 2019-01-27 | Discharge: 2019-01-27 | Disposition: A | Discharge status: Home / Self Care | Payer: Medicare Other | Source: Ambulatory Visit | Attending: Cardiology | Admitting: Cardiology

## 2019-01-27 VITALS — Temp 99.1°F

## 2019-01-27 DIAGNOSIS — R739 Hyperglycemia, unspecified: Secondary | ICD-10-CM | POA: Insufficient documentation

## 2019-01-27 DIAGNOSIS — I255 Ischemic cardiomyopathy: Secondary | ICD-10-CM | POA: Diagnosis not present

## 2019-01-27 DIAGNOSIS — M79675 Pain in left toe(s): Secondary | ICD-10-CM | POA: Diagnosis not present

## 2019-01-27 DIAGNOSIS — M79674 Pain in right toe(s): Secondary | ICD-10-CM

## 2019-01-27 DIAGNOSIS — D689 Coagulation defect, unspecified: Secondary | ICD-10-CM

## 2019-01-27 DIAGNOSIS — I739 Peripheral vascular disease, unspecified: Secondary | ICD-10-CM | POA: Diagnosis not present

## 2019-01-27 DIAGNOSIS — B351 Tinea unguium: Secondary | ICD-10-CM | POA: Diagnosis not present

## 2019-01-27 LAB — BASIC METABOLIC PANEL
Anion gap: 10 (ref 5–15)
BUN: 24 mg/dL — ABNORMAL HIGH (ref 8–23)
CO2: 27 mmol/L (ref 22–32)
Calcium: 9 mg/dL (ref 8.9–10.3)
Chloride: 93 mmol/L — ABNORMAL LOW (ref 98–111)
Creatinine, Ser: 1.41 mg/dL — ABNORMAL HIGH (ref 0.61–1.24)
GFR calc Af Amer: 59 mL/min — ABNORMAL LOW (ref >60)
GFR calc non Af Amer: 51 mL/min — ABNORMAL LOW (ref >60)
Glucose, Bld: 165 mg/dL — ABNORMAL HIGH (ref 70–99)
Potassium: 3.9 mmol/L (ref 3.5–5.1)
Sodium: 130 mmol/L — ABNORMAL LOW (ref 135–145)

## 2019-01-27 NOTE — Progress Notes (Signed)
Complaint:  Visit Type: Patient returns to my office for continued preventative foot care services. Complaint: Patient states" my nails have grown long and thick and become painful to walk and wear shoes"  The patient presents for preventative foot care services. No changes to ROS.  Patient is taking plavix.  Podiatric Exam: Vascular: dorsalis pedis and posterior tibial pulses are not  palpable bilateral.due to leg/foot swelling.   Capillary return is immediate. Temperature gradient is WNL. Skin turgor WNL  Sensorium: Normal Semmes Weinstein monofilament test. Normal tactile sensation bilaterally. Nail Exam: Pt has thick disfigured discolored nails with subungual debris noted bilateral entire nail hallux through fifth toenails Ulcer Exam: There is no evidence of ulcer or pre-ulcerative changes or infection. Orthopedic Exam: Muscle tone and strength are WNL. No limitations in general ROM. No crepitus or effusions noted. Foot type and digits show no abnormalities. Bony prominences are unremarkable.! And 1/2 inch limb leg discrepancy. Skin: No Porokeratosis. No infection or ulcers.    Diagnosis:  Onychomycosis, , Pain in right toe, pain in left toes,    Treatment & Plan Procedures and Treatment: Consent by patient was obtained for treatment procedures.   Debridement of mycotic and hypertrophic toenails, 1 through 5 bilateral and clearing of subungual debris. No ulceration, no infection noted. . Return  Office Procedure: Patient instructed to return to the office for a follow up visit 3 months for continued evaluation and treatment.    Gardiner Barefoot DPM

## 2019-01-31 ENCOUNTER — Telehealth (HOSPITAL_COMMUNITY): Payer: Self-pay | Admitting: *Deleted

## 2019-01-31 DIAGNOSIS — I5022 Chronic systolic (congestive) heart failure: Secondary | ICD-10-CM

## 2019-01-31 NOTE — Telephone Encounter (Signed)
Kristi w/paramedicine called about HH for pt, she states he had HH in the past and when he was d/c'd after his surgery they told him they were ordering HH again but he has never heard from anyone.  She states he would really benefit from [facility], orders placed.

## 2019-02-01 ENCOUNTER — Telehealth: Payer: Self-pay

## 2019-02-01 ENCOUNTER — Encounter (HOSPITAL_COMMUNITY): Payer: Self-pay

## 2019-02-01 ENCOUNTER — Other Ambulatory Visit (HOSPITAL_COMMUNITY): Payer: Self-pay

## 2019-02-01 ENCOUNTER — Ambulatory Visit (INDEPENDENT_AMBULATORY_CARE_PROVIDER_SITE_OTHER): Payer: Medicare Other

## 2019-02-01 ENCOUNTER — Other Ambulatory Visit (HOSPITAL_COMMUNITY): Payer: Self-pay | Admitting: *Deleted

## 2019-02-01 DIAGNOSIS — I5022 Chronic systolic (congestive) heart failure: Secondary | ICD-10-CM

## 2019-02-01 DIAGNOSIS — I130 Hypertensive heart and chronic kidney disease with heart failure and stage 1 through stage 4 chronic kidney disease, or unspecified chronic kidney disease: Secondary | ICD-10-CM

## 2019-02-01 DIAGNOSIS — I5043 Acute on chronic combined systolic (congestive) and diastolic (congestive) heart failure: Secondary | ICD-10-CM

## 2019-02-01 DIAGNOSIS — Z95810 Presence of automatic (implantable) cardiac defibrillator: Secondary | ICD-10-CM

## 2019-02-01 NOTE — Progress Notes (Signed)
Today was a home visit with Carl [name].  He is complaining of leg pain in both legs.  He states his neighbor has gout, he is worried he may.  He has no swelling in shin area where he is complaining of pain and I see no redness or do I feel heat.  He states it burns on the inside and feels hot on inside.  I asked if he is walking around in the daytime or is he sitting all day.  He states tries to get up some.  I advised more activity.  After about 30 minutes he states the pain left and feels better.  Not sure what could be causing it except he was up walking around.  Lungs are clear, swelling looks better.  He denies chest pain, dizziness, headaches or shortness of breath.  His brother Carl [name] contacted me this am and advised [facility] is suppose to start today.  Verified his medications and filled his med boxes up.  He has appt with [facility] tomorrow and he is aware.  He has Moms meals to eat but do not think he is eating them very often.  He states he was waiting for his sister to bring him a plate.  He states she brought him french fries last night and ice cream.  Talked to him about low sodium.  He states still drinking flavored water.  Will continue to visit for heart failure, diet and medication compliance.   [place] [phone]

## 2019-02-01 NOTE — Telephone Encounter (Signed)
Unable to leave a message for patient to remind of missed remote transmission.  

## 2019-02-02 ENCOUNTER — Ambulatory Visit (HOSPITAL_COMMUNITY)
Admission: RE | Admit: 2019-02-02 | Discharge: 2019-02-02 | Disposition: A | Discharge status: Home / Self Care | Payer: Medicare Other | Source: Ambulatory Visit | Attending: Internal Medicine | Admitting: Internal Medicine

## 2019-02-02 ENCOUNTER — Encounter (HOSPITAL_COMMUNITY): Payer: Self-pay

## 2019-02-02 ENCOUNTER — Other Ambulatory Visit: Payer: Self-pay

## 2019-02-02 ENCOUNTER — Ambulatory Visit (HOSPITAL_BASED_OUTPATIENT_CLINIC_OR_DEPARTMENT_OTHER)
Admission: RE | Admit: 2019-02-02 | Discharge: 2019-02-02 | Disposition: A | Discharge status: Home / Self Care | Payer: Medicare Other | Source: Ambulatory Visit | Attending: Physician Assistant | Admitting: Physician Assistant

## 2019-02-02 ENCOUNTER — Other Ambulatory Visit (HOSPITAL_COMMUNITY): Payer: Medicare Other

## 2019-02-02 VITALS — BP 99/68 | HR 84 | Wt 144.0 lb

## 2019-02-02 DIAGNOSIS — Z95810 Presence of automatic (implantable) cardiac defibrillator: Secondary | ICD-10-CM

## 2019-02-02 DIAGNOSIS — I255 Ischemic cardiomyopathy: Secondary | ICD-10-CM | POA: Diagnosis not present

## 2019-02-02 DIAGNOSIS — F70 Mild intellectual disabilities: Secondary | ICD-10-CM | POA: Diagnosis not present

## 2019-02-02 DIAGNOSIS — I2510 Atherosclerotic heart disease of native coronary artery without angina pectoris: Secondary | ICD-10-CM | POA: Diagnosis present

## 2019-02-02 DIAGNOSIS — N182 Chronic kidney disease, stage 2 (mild): Secondary | ICD-10-CM

## 2019-02-02 DIAGNOSIS — M109 Gout, unspecified: Secondary | ICD-10-CM | POA: Insufficient documentation

## 2019-02-02 DIAGNOSIS — Z951 Presence of aortocoronary bypass graft: Secondary | ICD-10-CM | POA: Insufficient documentation

## 2019-02-02 DIAGNOSIS — Z833 Family history of diabetes mellitus: Secondary | ICD-10-CM | POA: Diagnosis not present

## 2019-02-02 DIAGNOSIS — Z9889 Other specified postprocedural states: Secondary | ICD-10-CM

## 2019-02-02 DIAGNOSIS — N183 Chronic kidney disease, stage 3 (moderate): Secondary | ICD-10-CM | POA: Insufficient documentation

## 2019-02-02 DIAGNOSIS — I130 Hypertensive heart and chronic kidney disease with heart failure and stage 1 through stage 4 chronic kidney disease, or unspecified chronic kidney disease: Secondary | ICD-10-CM | POA: Insufficient documentation

## 2019-02-02 DIAGNOSIS — I5022 Chronic systolic (congestive) heart failure: Secondary | ICD-10-CM

## 2019-02-02 DIAGNOSIS — Z87891 Personal history of nicotine dependence: Secondary | ICD-10-CM | POA: Diagnosis not present

## 2019-02-02 DIAGNOSIS — I509 Heart failure, unspecified: Secondary | ICD-10-CM

## 2019-02-02 DIAGNOSIS — Z79899 Other long term (current) drug therapy: Secondary | ICD-10-CM | POA: Diagnosis not present

## 2019-02-02 DIAGNOSIS — M79604 Pain in right leg: Secondary | ICD-10-CM | POA: Diagnosis not present

## 2019-02-02 DIAGNOSIS — E785 Hyperlipidemia, unspecified: Secondary | ICD-10-CM | POA: Diagnosis not present

## 2019-02-02 DIAGNOSIS — I340 Nonrheumatic mitral (valve) insufficiency: Secondary | ICD-10-CM | POA: Diagnosis not present

## 2019-02-02 DIAGNOSIS — M79605 Pain in left leg: Secondary | ICD-10-CM

## 2019-02-02 DIAGNOSIS — Z7982 Long term (current) use of aspirin: Secondary | ICD-10-CM | POA: Insufficient documentation

## 2019-02-02 DIAGNOSIS — Z7902 Long term (current) use of antithrombotics/antiplatelets: Secondary | ICD-10-CM | POA: Insufficient documentation

## 2019-02-02 DIAGNOSIS — Z8249 Family history of ischemic heart disease and other diseases of the circulatory system: Secondary | ICD-10-CM | POA: Diagnosis not present

## 2019-02-02 DIAGNOSIS — F329 Major depressive disorder, single episode, unspecified: Secondary | ICD-10-CM | POA: Insufficient documentation

## 2019-02-02 LAB — BASIC METABOLIC PANEL
Anion gap: 10 (ref 5–15)
BUN: 24 mg/dL — ABNORMAL HIGH (ref 8–23)
CO2: 28 mmol/L (ref 22–32)
Calcium: 9.2 mg/dL (ref 8.9–10.3)
Chloride: 93 mmol/L — ABNORMAL LOW (ref 98–111)
Creatinine, Ser: 1.59 mg/dL — ABNORMAL HIGH (ref 0.61–1.24)
GFR calc Af Amer: 51 mL/min — ABNORMAL LOW (ref >60)
GFR calc non Af Amer: 44 mL/min — ABNORMAL LOW (ref >60)
Glucose, Bld: 79 mg/dL (ref 70–99)
Potassium: 4.1 mmol/L (ref 3.5–5.1)
Sodium: 131 mmol/L — ABNORMAL LOW (ref 135–145)

## 2019-02-02 LAB — CBC
HCT: 33.3 % — ABNORMAL LOW (ref 39.0–52.0)
Hemoglobin: 10.9 g/dL — ABNORMAL LOW (ref 13.0–17.0)
MCH: 33.7 pg (ref 26.0–34.0)
MCHC: 32.7 g/dL (ref 30.0–36.0)
MCV: 103.1 fL — ABNORMAL HIGH (ref 80.0–100.0)
Platelets: 232 10*3/uL (ref 150–400)
RBC: 3.23 MIL/uL — ABNORMAL LOW (ref 4.22–5.81)
RDW: 16.2 % — ABNORMAL HIGH (ref 11.5–15.5)
WBC: 8.3 10*3/uL (ref 4.0–10.5)
nRBC: 0 % (ref 0.0–0.2)

## 2019-02-02 LAB — ECHOCARDIOGRAM COMPLETE: Weight: 2304 oz

## 2019-02-02 LAB — URIC ACID: Uric Acid, Serum: 8.7 mg/dL — ABNORMAL HIGH (ref 3.7–8.6)

## 2019-02-02 NOTE — Progress Notes (Signed)
  Echocardiogram 2D Echocardiogram has been performed.  Carl [name] 02/02/2019, 3:43 PM

## 2019-02-02 NOTE — Patient Instructions (Signed)
Lab work done today. We will notify you of any abnormal lab work.  Please follow up in 6 weeks.  At the [facility], you and your health needs are our priority. As part of our continuing mission to provide you with exceptional heart care, we have created designated Provider Care Teams. These Care Teams include your primary Cardiologist (physician) and Advanced Practice Providers (APPs- Physician Assistants and Nurse Practitioners) who all work together to provide you with the care you need, when you need it.   You may see any of the following providers on your designated Care Team at your next follow up: Marland Kitchen Dr [name] . Dr [name] . Darrick Grinder, NP
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
## 2019-02-03 ENCOUNTER — Telehealth: Payer: Medicare Other | Admitting: Physician Assistant

## 2019-02-04 NOTE — Progress Notes (Signed)
EPIC Encounter for ICM Monitoring  Patient Name: Carl [name] is a 68 y.o. male Date: 02/04/2019 Primary Care Physican: [name], MD Primary Cardiologist:McLean Electrophysiologist: Allred Last Weight:150lbs(01/18/2019 office visit)        Transmission reviewed and call to brother.  Patient is doing well with exception he has some leg pain.  Advised to call PCP regarding leg pain if needed.    OptivolThoracic impedancenormal.  Prescribed:Furosemide20mg take4tablets(80 mg total) every morning and 40 mg every evening (increased at HF clinic OV 01/18/2019)  Labs: 01/18/2019 Creatinine 1.28, BUN 28, Potassium 3.2, Sodium 136, GFR 58->60, BNP 1261.0 01/11/2019 Creatinine 1.46, BUN 20, Potassium 3.8, Sodium 132, GFR 49-57 12/31/2018 Creatinine2.27, BUN49, Potassium3.7, [id number], [id number] 12/30/2018 Creatinine2.42, BUN50, Potassium3.8, [id number], GFR27-31  12/27/2018 Creatinine2.13, BUN42, Potassium3.7, Sodium129, [id number], BNP 1846.8 12/21/2018 Creatinine1.42, BUN37, Potassium3.9, Sodium131, [id number] 11/23/2018 Creatinine 1.75, BUN 40, Potassium 4.2, Sodium 137, GFR 39.32-45.57 11/11/2018 Creatinine 1.45, BUN 15, Potassium 3.6, Sodium 136, GFR 49-57 10/16/2018 Creatinine2.01, BUN47, Potassium4.5, Sodium134, POL41-03 10/07/2018 Creatinine1.44, BUN22, Potassium4.6, Sodium134, GFR50-58 A complete set of results can be found in Results Review  Recommendations: No changes and encouraged to call for fluid symptoms  Follow-up plan: ICM clinic phone appointment on7/13/2020.   Copy of ICM check sent to Dr.Allred.  3 month ICM trend: 02/02/2019    1 Year ICM trend:       Rosalene Billings, RN 02/04/2019 12:36 PM

## 2019-02-08 ENCOUNTER — Telehealth (HOSPITAL_COMMUNITY): Payer: Self-pay

## 2019-02-08 NOTE — Telephone Encounter (Signed)
Esperanza Sheets brother contacted me yesterday to let me know [place] is at the beach for a [place] and if I need anything to call him for [place].  Contacted [place] because [place] was suppose to go out yesterday and see how the visit went.  He states they did not come and has not been there but then he started talking about a family living in his attic and started talking about things that can not be true.  He is aware of appt with Dr Ginette Pitman this am due to his legs hurting.  He has a service picking him up and taking him and no family member is going with him.  Contacted [name] and explained to him that Carole is not acting right this am and he may want to contact Dr Ginette Pitman and let him examine Gerren further for possible infection or something neuro going on.  He agreed and will contact him.  I was also contacted by [facility] yesterday and they are wanting to get on board with Jonas's care also, advised [name].  Will follow up with [name] later today.   [place] [phone]

## 2019-02-09 ENCOUNTER — Other Ambulatory Visit: Payer: Self-pay

## 2019-02-09 ENCOUNTER — Other Ambulatory Visit (HOSPITAL_COMMUNITY): Payer: Self-pay

## 2019-02-09 ENCOUNTER — Encounter (HOSPITAL_COMMUNITY): Payer: Self-pay

## 2019-02-09 ENCOUNTER — Encounter (HOSPITAL_COMMUNITY): Payer: Self-pay | Admitting: Emergency Medicine

## 2019-02-09 ENCOUNTER — Emergency Department (HOSPITAL_COMMUNITY)
Admission: EM | Admit: 2019-02-09 | Discharge: 2019-02-10 | Disposition: A | Discharge status: Home / Self Care | Payer: Medicare Other | Attending: Emergency Medicine | Admitting: Emergency Medicine

## 2019-02-09 DIAGNOSIS — Z008 Encounter for other general examination: Secondary | ICD-10-CM | POA: Diagnosis not present

## 2019-02-09 DIAGNOSIS — I130 Hypertensive heart and chronic kidney disease with heart failure and stage 1 through stage 4 chronic kidney disease, or unspecified chronic kidney disease: Secondary | ICD-10-CM | POA: Diagnosis not present

## 2019-02-09 DIAGNOSIS — F29 Unspecified psychosis not due to a substance or known physiological condition: Secondary | ICD-10-CM | POA: Diagnosis not present

## 2019-02-09 DIAGNOSIS — I5022 Chronic systolic (congestive) heart failure: Secondary | ICD-10-CM | POA: Diagnosis not present

## 2019-02-09 DIAGNOSIS — Z87891 Personal history of nicotine dependence: Secondary | ICD-10-CM | POA: Insufficient documentation

## 2019-02-09 DIAGNOSIS — N183 Chronic kidney disease, stage 3 (moderate): Secondary | ICD-10-CM | POA: Insufficient documentation

## 2019-02-09 DIAGNOSIS — I2510 Atherosclerotic heart disease of native coronary artery without angina pectoris: Secondary | ICD-10-CM | POA: Insufficient documentation

## 2019-02-09 DIAGNOSIS — I252 Old myocardial infarction: Secondary | ICD-10-CM | POA: Insufficient documentation

## 2019-02-09 DIAGNOSIS — R443 Hallucinations, unspecified: Secondary | ICD-10-CM | POA: Diagnosis not present

## 2019-02-09 DIAGNOSIS — Z7902 Long term (current) use of antithrombotics/antiplatelets: Secondary | ICD-10-CM | POA: Diagnosis not present

## 2019-02-09 DIAGNOSIS — Z20828 Contact with and (suspected) exposure to other viral communicable diseases: Secondary | ICD-10-CM | POA: Insufficient documentation

## 2019-02-09 DIAGNOSIS — Z951 Presence of aortocoronary bypass graft: Secondary | ICD-10-CM | POA: Diagnosis not present

## 2019-02-09 DIAGNOSIS — Z79899 Other long term (current) drug therapy: Secondary | ICD-10-CM | POA: Insufficient documentation

## 2019-02-09 DIAGNOSIS — Z7982 Long term (current) use of aspirin: Secondary | ICD-10-CM | POA: Insufficient documentation

## 2019-02-09 DIAGNOSIS — R441 Visual hallucinations: Secondary | ICD-10-CM | POA: Diagnosis present

## 2019-02-09 LAB — COMPREHENSIVE METABOLIC PANEL
ALT: 18 U/L (ref 0–44)
AST: 28 U/L (ref 15–41)
Albumin: 3.6 g/dL (ref 3.5–5.0)
Alkaline Phosphatase: 92 U/L (ref 38–126)
Anion gap: 11 (ref 5–15)
BUN: 28 mg/dL — ABNORMAL HIGH (ref 8–23)
CO2: 24 mmol/L (ref 22–32)
Calcium: 8.9 mg/dL (ref 8.9–10.3)
Chloride: 92 mmol/L — ABNORMAL LOW (ref 98–111)
Creatinine, Ser: 1.75 mg/dL — ABNORMAL HIGH (ref 0.61–1.24)
GFR calc Af Amer: 46 mL/min — ABNORMAL LOW (ref >60)
GFR calc non Af Amer: 39 mL/min — ABNORMAL LOW (ref >60)
Glucose, Bld: 186 mg/dL — ABNORMAL HIGH (ref 70–99)
Potassium: 4.3 mmol/L (ref 3.5–5.1)
Sodium: 127 mmol/L — ABNORMAL LOW (ref 135–145)
Total Bilirubin: 1.8 mg/dL — ABNORMAL HIGH (ref 0.3–1.2)
Total Protein: 7.3 g/dL (ref 6.5–8.1)

## 2019-02-09 LAB — RAPID URINE DRUG SCREEN, HOSP PERFORMED
Amphetamines: NOT DETECTED
Barbiturates: NOT DETECTED
Benzodiazepines: NOT DETECTED
Cocaine: NOT DETECTED
Opiates: NOT DETECTED
Tetrahydrocannabinol: NOT DETECTED

## 2019-02-09 LAB — URINALYSIS, ROUTINE W REFLEX MICROSCOPIC
Bilirubin Urine: NEGATIVE
Glucose, UA: NEGATIVE mg/dL
Hgb urine dipstick: NEGATIVE
Ketones, ur: NEGATIVE mg/dL
Leukocytes,Ua: NEGATIVE
Nitrite: NEGATIVE
Protein, ur: NEGATIVE mg/dL
Specific Gravity, Urine: 1.009 (ref 1.005–1.030)
pH: 5 (ref 5.0–8.0)

## 2019-02-09 LAB — CBC
HCT: 30.3 % — ABNORMAL LOW (ref 39.0–52.0)
Hemoglobin: 9.9 g/dL — ABNORMAL LOW (ref 13.0–17.0)
MCH: 32.9 pg (ref 26.0–34.0)
MCHC: 32.7 g/dL (ref 30.0–36.0)
MCV: 100.7 fL — ABNORMAL HIGH (ref 80.0–100.0)
Platelets: 199 10*3/uL (ref 150–400)
RBC: 3.01 MIL/uL — ABNORMAL LOW (ref 4.22–5.81)
RDW: 15.5 % (ref 11.5–15.5)
WBC: 7.6 10*3/uL (ref 4.0–10.5)
nRBC: 0 % (ref 0.0–0.2)

## 2019-02-09 LAB — ETHANOL: Alcohol, Ethyl (B): <10 mg/dL (ref <10)

## 2019-02-09 LAB — ACETAMINOPHEN LEVEL: Acetaminophen (Tylenol), Serum: <10 ug/mL — ABNORMAL LOW (ref 10–30)

## 2019-02-09 LAB — SALICYLATE LEVEL: Salicylate Lvl: <7 mg/dL (ref 2.8–30.0)

## 2019-02-09 MED ORDER — SODIUM CHLORIDE 0.9% FLUSH: 3.0000 mL | Freq: Once | INTRAVENOUS | Status: DC

## 2019-02-09 NOTE — ED Triage Notes (Signed)
Pt in [place] with daughter. Per family pt has been "talking crazy" X few days, has been having auditory and visual hallucinations. Was seen by PCP and instructed to come here for a psych eval. Pt is A/OX3. Has no complaints at this time.

## 2019-02-09 NOTE — Progress Notes (Signed)
Today was a visit with Deontra at home.  Carl [name] his brother contacted me and advised Dyan was talking crazy, thinking people living upstairs in attic and some other things.  Julio Sicks to contact his PCP and advise them to do a urine test to check for UTI, Juanantonio had appt for pain in his legs.  Yesterday evening [name] contacted me and advised he must of UTI they called in medicine for him, his daughter was picking it up, she was to give him dose last night and I be out today to place in med box.  When arrived he has picked up allupurinol and prednisone.  Contacted Dr Ginette Pitman office and they advised Bark[place] refused urine test and blood work except uric acid test for gout.  It was high.  He received a shot in hip and started on prednisone also.  Asked if he could do a urine test because today he is worst talking out of his head than yesterday.  Had his daughter pick a urine cup and took it back today for urine test.  Julio Sicks if urine comes back clear, he need a psych consult or neuro check.  He agreed.  Home health nurse from [place] [facility] also agreed.  [place] is not safe to leave alone, his daughter states he can stay with him.  He was talking about beating something with a hammer that was in his bottom.  He is not hot to touch, vitals are good.  No swelling in extremities.  He complains of pain in lower legs, denies chest pain.  He is none stop talk then he may cry.  Spoke to HF clinic about situation, asked about Plavix side effects, she states could cause UTI, if not safe at home may want to send him to ER.  Dr Ginette Pitman office advised could be a few days before urine test comes back.  Advised his brother [name]. Will continue to visit for medication compliance and heart failure.   [place] [phone]

## 2019-02-10 ENCOUNTER — Emergency Department (HOSPITAL_COMMUNITY): Payer: Medicare Other

## 2019-02-10 DIAGNOSIS — F29 Unspecified psychosis not due to a substance or known physiological condition: Secondary | ICD-10-CM | POA: Diagnosis not present

## 2019-02-10 DIAGNOSIS — R443 Hallucinations, unspecified: Secondary | ICD-10-CM | POA: Diagnosis present

## 2019-02-10 MED ORDER — CARVEDILOL 3.125 MG PO TABS
3.1250 mg | ORAL_TABLET | Freq: Two times a day (BID) | ORAL | Status: DC
  Administered 2019-02-10: 3.125 mg via ORAL
  Filled 2019-02-10 (×4): qty 1

## 2019-02-10 MED ORDER — ASPIRIN EC 81 MG PO TBEC
81.0000 mg | DELAYED_RELEASE_TABLET | Freq: Every day | ORAL | Status: DC
  Administered 2019-02-10: 81 mg via ORAL
  Filled 2019-02-10: qty 1

## 2019-02-10 MED ORDER — POTASSIUM CHLORIDE CRYS ER 20 MEQ PO TBCR
20.0000 meq | EXTENDED_RELEASE_TABLET | Freq: Two times a day (BID) | ORAL | Status: DC
  Administered 2019-02-10: 20 meq via ORAL
  Filled 2019-02-10: qty 1

## 2019-02-10 MED ORDER — PANTOPRAZOLE SODIUM 40 MG PO TBEC
40.0000 mg | DELAYED_RELEASE_TABLET | Freq: Every day | ORAL | Status: DC
  Administered 2019-02-10: 40 mg via ORAL
  Filled 2019-02-10: qty 1

## 2019-02-10 MED ORDER — ATORVASTATIN CALCIUM 40 MG PO TABS: 40.0000 mg | ORAL_TABLET | Freq: Every day | ORAL | Status: DC

## 2019-02-10 MED ORDER — TORSEMIDE 20 MG PO TABS
40.0000 mg | ORAL_TABLET | Freq: Two times a day (BID) | ORAL | Status: DC
  Filled 2019-02-10: qty 2

## 2019-02-10 MED ORDER — ALBUTEROL SULFATE (2.5 MG/3ML) 0.083% IN NEBU: 2.5000 mg | INHALATION_SOLUTION | Freq: Four times a day (QID) | RESPIRATORY_TRACT | Status: DC | PRN

## 2019-02-10 MED ORDER — CLOPIDOGREL BISULFATE 75 MG PO TABS
75.0000 mg | ORAL_TABLET | Freq: Every day | ORAL | Status: DC
  Administered 2019-02-10: 09:00:00 75 mg via ORAL
  Filled 2019-02-10 (×2): qty 1

## 2019-02-10 MED ORDER — FERROUS SULFATE 325 (65 FE) MG PO TABS
325.0000 mg | ORAL_TABLET | Freq: Every day | ORAL | Status: DC
  Administered 2019-02-10: 325 mg via ORAL
  Filled 2019-02-10 (×2): qty 1

## 2019-02-10 MED ORDER — ACETAMINOPHEN 325 MG PO TABS: 325.0000 mg | ORAL_TABLET | Freq: Four times a day (QID) | ORAL | Status: DC | PRN

## 2019-02-10 MED ORDER — CITALOPRAM HYDROBROMIDE 10 MG PO TABS
20.0000 mg | ORAL_TABLET | Freq: Every day | ORAL | Status: DC
  Administered 2019-02-10: 20 mg via ORAL
  Filled 2019-02-10: qty 2

## 2019-02-10 MED ORDER — NON FORMULARY: 50.0000 mg | Freq: Every day | Status: DC

## 2019-02-10 MED ORDER — EPLERENONE 25 MG PO TABS
50.0000 mg | ORAL_TABLET | Freq: Every day | ORAL | Status: DC
  Administered 2019-02-10: 50 mg via ORAL
  Filled 2019-02-10 (×2): qty 2

## 2019-02-10 NOTE — ED Provider Notes (Signed)
[facility] EMERGENCY DEPARTMENT Provider Note   CSN: [id number] Arrival date & time: 02/09/19  2056    History   Chief Complaint Chief Complaint  Patient presents with  . Altered Mental Status    HPI Carl [name] is a 68 y.o. male.     68 y.o male with history of CAD status post CABG x5, ischemic cardiomyopathy (EF 25% with ICD), CHF, esophageal reflux presents to the emergency department with daughter.  Per family, patient has been hallucinating x4 days.  He has been seeing people in his home.  Also believes there are people in his attic.  Daughter endorses morbid ruminations lately as well.  He has not had any recent medication changes.  Was given a shot of Depo-Medrol on 02/08/2019, but hallucinations were present prior to this injection.  No alcohol or illicit drug use reported.  The patient has no complaints of pain.  Denies headache, chest pain, shortness of breath.  Family denies facial drooping, speech changes, unilateral extremity weakness. He has never been evaluated before behavioral health issues.  Daughter denies the patient ever being combative since these hallucinations began 4 days ago.   Altered Mental Status   Past Medical History:  Diagnosis Date  . AICD (automatic cardioverter/defibrillator) present   . Anemia   . Anxiety   . Arthritis   . Brunner's gland hyperplasia of duodenum   . CHF (congestive heart failure) ([place])   . Coronary artery disease   . External hemorrhoids   . Fracture of femoral neck, left ([place]) 07/21/2018  . GERD (gastroesophageal reflux disease)   . Hearing loss   . HH (hiatus hernia)   . Internal hemorrhoids   . Ischemic cardiomyopathy   . Leg fracture, left   . Myocardial infarction ([place])   . Paronychia   . Pneumonia   . Psoriasis   . PUD (peptic ulcer disease)   . Schatzki's ring   . Tubular adenoma of colon   . Vitiligo     Patient Active Problem List   Diagnosis Date Noted  . Elevated blood sugar  01/27/2019  . S/P mitral valve repair 12/31/2018  . Bilateral pulmonary contusion 10/17/2018  . Cardiac LV ejection fraction of 20-34% 10/17/2018  . CHF (congestive heart failure) ([place]) 10/17/2018  . History of ischemic cardiomyopathy 10/17/2018  . ICD (implantable cardioverter-defibrillator) in place 10/17/2018  . S/p left hip fracture 10/17/2018  . Traumatic retroperitoneal hematoma 10/17/2018  . Age-related osteoporosis with current pathological fracture with routine healing 07/28/2018  . CAD (coronary artery disease) 07/21/2018  . GERD (gastroesophageal reflux disease) 07/21/2018  . Depression 07/21/2018  . CKD (chronic kidney disease), stage III ([place]) 07/21/2018  . Iron deficiency anemia 07/21/2018  . Hypokalemia 07/21/2018  . Closed left hip fracture ([place]) 07/21/2018  . Fracture of femoral neck, left ([place]) 07/21/2018  . Lower extremity pain, bilateral 11/12/2017  . 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 Gastroesophageal reflux disease with esophagitis 09/14/2017  . Hyperlipidemia 09/01/2017  . Essential hypertension 09/01/2017  . Atherosclerotic peripheral vascular disease with intermittent claudication ([place]) 09/01/2017  . Anemia, unspecified 08/06/2017  . GAD (generalized anxiety disorder) 06/18/2017  . Prostate cancer screening 04/28/2017  . Hydrocele 04/28/2017  . Bradycardia   . Premature ventricular contraction   . Severe mitral insufficiency   . Cardiomyopathy, ischemic 04/26/2015  . Difficulty hearing 12/29/2014  . Acute on chronic systolic CHF (congestive heart failure) ([place]) 10/10/2014  . 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 Protein-calorie  malnutrition, severe ([place]) 07/29/2014  . AKI (acute kidney injury) ([place]) 07/29/2014    Past Surgical History:  Procedure Laterality Date  . ATRIAL SEPTAL DEFECT(ASD) CLOSURE N/A 12/30/2018   Procedure: ATRIAL SEPTAL DEFECT(ASD) CLOSURE;  Surgeon: [name], MD;  Location: [facility];  Service:  Cardiovascular;  Laterality: N/A;  . CARDIAC SURGERY    . COLONOSCOPY WITH ESOPHAGOGASTRODUODENOSCOPY (EGD)    . COLONOSCOPY WITH PROPOFOL N/A 08/24/2017   Procedure: COLONOSCOPY WITH PROPOFOL;  Surgeon: [name], MD;  Location: [facility] ENDOSCOPY;  Service: Endoscopy;  Laterality: N/A;  . CORONARY ARTERY BYPASS GRAFT N/A 08/03/2014   Procedure: CORONARY ARTERY BYPASS GRAFTING (CABG);  Surgeon: [name], MD;  Location: [facility];  Service: Open Heart Surgery;  Laterality: N/A;  . EP IMPLANTABLE DEVICE N/A 05/01/2015   MDT ICD implanted for primary prevention of sudden death  . ESOPHAGOGASTRODUODENOSCOPY (EGD) WITH PROPOFOL N/A 04/16/2015   Procedure: ESOPHAGOGASTRODUODENOSCOPY (EGD) WITH PROPOFOL;  Surgeon: [name], MD;  Location: [facility] ENDOSCOPY;  Service: Endoscopy;  Laterality: N/A;  . ESOPHAGOGASTRODUODENOSCOPY (EGD) WITH PROPOFOL N/A 08/24/2017   Procedure: ESOPHAGOGASTRODUODENOSCOPY (EGD) WITH PROPOFOL;  Surgeon: [name], MD;  Location: [facility] ENDOSCOPY;  Service: Endoscopy;  Laterality: N/A;  . FRACTURE SURGERY    . HERNIA REPAIR    . HIP PINNING,CANNULATED Left 07/22/2018   Procedure: CANNULATED HIP PINNING;  Surgeon: [name], MD;  Location: [facility];  Service: Orthopedics;  Laterality: Left;  . MITRAL VALVE REPAIR N/A 12/30/2018   Procedure: MITRAL VALVE REPAIR;  Surgeon: [name], MD;  Location: [facility];  Service: Cardiovascular;  Laterality: N/A;  . RIGHT/LEFT HEART CATH AND CORONARY/GRAFT ANGIOGRAPHY N/A 12/21/2018   Procedure: RIGHT/LEFT HEART CATH AND CORONARY/GRAFT ANGIOGRAPHY;  Surgeon: [name], MD;  Location: [facility] LAB;  Service: Cardiovascular;  Laterality: N/A;  . TEE WITHOUT CARDIOVERSION N/A 08/03/2014   Procedure: TRANSESOPHAGEAL ECHOCARDIOGRAM (TEE);  Surgeon: [name], MD;  Location: [facility];  Service: Open Heart Surgery;  Laterality: N/A;  . TEE WITHOUT CARDIOVERSION N/A 02/10/2018   Procedure:  TRANSESOPHAGEAL ECHOCARDIOGRAM (TEE);  Surgeon: [name], MD;  Location: [facility] ENDOSCOPY;  Service: Cardiovascular;  Laterality: N/A;        Home Medications    Prior to Admission medications   Medication Sig Start Date End Date Taking? Authorizing Provider  acetaminophen (TYLENOL) 325 MG tablet Take 325 mg by mouth every 6 (six) hours as needed for mild pain or moderate pain.     [provider]  albuterol (PROVENTIL) (2.5 MG/3ML) 0.083% nebulizer solution USE 1 VIAL IN NEBULIZER EVERY 6 HOURS AS NEEDED FOR WHEEZING 11/24/18   [provider]  Ascorbic Acid (VITAMIN C) 100 MG tablet Take 100 mg by mouth daily.    [provider]  aspirin EC 81 MG tablet Take 1 tablet (81 mg total) by mouth daily. 08/26/18   [name], MD  atorvastatin (LIPITOR) 40 MG tablet Take 1 tablet (40 mg total) by mouth daily at 6 PM. 01/10/19   [name], MD  carvedilol (COREG) 3.125 MG tablet Take 1 tablet (3.125 mg total) by mouth 2 (two) times daily. 11/11/18 02/09/19  [name], MD  Cholecalciferol (VITAMIN D3 SUPER STRENGTH) 50 MCG (2000 UT) TABS Take 2,000 Units by mouth daily.     [provider]  citalopram (CELEXA) 20 MG tablet Take 20 mg by mouth daily.  07/09/16   [provider]  clopidogrel (PLAVIX) 75  MG tablet Take 1 tablet (75 mg total) by mouth daily with breakfast. 01/01/19   Eileen Stanford, PA-C  cyanocobalamin 500 MCG tablet Take 500 mcg by mouth daily.    [provider]  DR SMITHS DIAPER 10 % OINT Apply 1 application topically daily as needed (IRRITATION).  09/01/18   [provider]  eplerenone (INSPRA) 50 MG tablet Take 1 tablet (50 mg total) by mouth daily. 01/12/19   [name], MD  ferrous sulfate 325 (65 FE) MG tablet Take 1 tablet (325 mg total) by mouth daily with breakfast. 02/09/18   [name], MD  ketoconazole (NIZORAL) 2 % shampoo MASSAGE ONTO SCALP/FACE. LET SIT A FEW MINUTES, THEN RINSE OFF  09/28/18   [provider]  Multiple Vitamin (MULTIVITAMIN) tablet Take 1 tablet by mouth daily.    [provider]  mupirocin ointment (BACTROBAN) 2 % APPLY TO EACH NOSTRIL TWICE A DAY FOR 5 DAYS 12/27/18   [provider]  pantoprazole (PROTONIX) 40 MG tablet Take 1 tablet (40 mg total) by mouth daily. 01/01/19   Eileen Stanford, PA-C  potassium chloride SA (K-DUR) 20 MEQ tablet Take 1 tablet (20 mEq total) by mouth 2 (two) times daily. 01/18/19   Clegg, Amy D, NP  torsemide (DEMADEX) 20 MG tablet Take 80mg  (4 tablets) in the AM and 40mg  (2 tablets) in the PM. 01/18/19   [name], MD    Family History Family History  Problem Relation Age of Onset  . Valvular heart disease Mother        Ruptured valve  . CAD Father   . Heart Problems Brother        Stents x 4  . Diabetes Brother   . Prostate cancer Neg Hx   . Bladder Cancer Neg Hx   . Kidney cancer Neg Hx     Social History Social History   Tobacco Use  . Smoking status: Former Smoker    Quit date: 2014    Years since quitting: 6.4  . Smokeless tobacco: Never Used  . Tobacco comment: 07/30/2017 Quit in 2014  Substance Use Topics  . Alcohol use: No    Alcohol/week: 0.0 standard drinks  . Drug use: No     Allergies   Spironolactone   Review of Systems Review of Systems Ten systems reviewed and are negative for acute change, except as noted in the HPI.    Physical Exam Updated Vital Signs BP 101/72 (BP Location: Right Arm)   Pulse 86   Temp 98.7 F (37.1 C) (Oral)   Resp 16   Ht 6' (1.829 m)   Wt 63.5 kg   SpO2 98%   BMI 18.99 kg/m   Physical Exam Vitals signs and nursing note reviewed.  Constitutional:      General: He is not in acute distress.    Appearance: He is well-developed. He is not diaphoretic.     Comments: Nontoxic appearing, pleasant.  HENT:     Head: Normocephalic and atraumatic.  Eyes:     General: No scleral icterus.    Conjunctiva/sclera: Conjunctivae  normal.  Neck:     Musculoskeletal: Normal range of motion.  Cardiovascular:     Rate and Rhythm: Normal rate and regular rhythm.     Pulses: Normal pulses.  Pulmonary:     Effort: Pulmonary effort is normal. No respiratory distress.     Comments: Respirations even and unlabored Musculoskeletal: Normal range of motion.  Skin:  General: Skin is warm and dry.     Coloration: Skin is not pale.     Findings: No erythema.     Comments: Vitiligo.   Neurological:     Mental Status: He is alert and oriented to person, place, and time.  Psychiatric:        Behavior: Behavior normal.      ED Treatments / Results  Labs (all labs ordered are listed, but only abnormal results are displayed) Labs Reviewed  COMPREHENSIVE METABOLIC PANEL - Abnormal; Notable for the following components:      Result Value   Sodium 127 (*)    Chloride 92 (*)    Glucose, Bld 186 (*)    BUN 28 (*)    Creatinine, Ser 1.75 (*)    Total Bilirubin 1.8 (*)    GFR calc non Af Amer 39 (*)    GFR calc Af Amer 46 (*)    All other components within normal limits  CBC - Abnormal; Notable for the following components:   RBC 3.01 (*)    Hemoglobin 9.9 (*)    HCT 30.3 (*)    MCV 100.7 (*)    All other components within normal limits  ACETAMINOPHEN LEVEL - Abnormal; Notable for the following components:   Acetaminophen (Tylenol), Serum <10 (*)    All other components within normal limits  NOVEL CORONAVIRUS, NAA (HOSPITAL ORDER, SEND-OUT TO REF LAB)  URINALYSIS, ROUTINE W REFLEX MICROSCOPIC  SALICYLATE LEVEL  RAPID URINE DRUG SCREEN, HOSP PERFORMED  ETHANOL    EKG EKG Interpretation  Date/Time:  Wednesday February 09 2019 21:10:42 EDT Ventricular Rate:  83 PR Interval:  182 QRS Duration: 112 QT Interval:  428 QTC Calculation: 502 R Axis:   15 Text Interpretation:  Sinus rhythm with occasional Premature ventricular complexes Possible Left atrial enlargement Incomplete right bundle branch block Cannot rule  out Inferior infarct , age undetermined Anteroseptal infarct , age undetermined Prolonged QT Abnormal ECG abnormal ecg but looks similar to multiple previous ecgs as recent as 5/19 Confirmed by Merrily Pew [phone]) on 02/09/2019 11:06:49 PM   Radiology Ct Head Wo Contrast  Result Date: 02/10/2019 CLINICAL DATA:  Altered mental status. EXAM: CT HEAD WITHOUT CONTRAST TECHNIQUE: Contiguous axial images were obtained from the base of the skull through the vertex without intravenous contrast. COMPARISON:  Head CT 10/16/2018 FINDINGS: Brain: No intracranial hemorrhage, mass effect, or midline shift. Unchanged degree of atrophy and chronic small vessel ischemia. No hydrocephalus. The basilar cisterns are patent. No evidence of territorial infarct or acute ischemia. No extra-axial or intracranial fluid collection. Vascular: Atherosclerosis of skullbase vasculature without hyperdense vessel or abnormal calcification. Skull: No fracture or focal lesion. Sinuses/Orbits: Paranasal sinuses and mastoid air cells are clear. The visualized orbits are unremarkable. Other: None. IMPRESSION: 1. No acute intracranial abnormality. 2. Unchanged atrophy and chronic small vessel ischemia. Electronically Signed   By: Keith Rake M.D.   On: 02/10/2019 02:07    Procedures Procedures (including critical care time)  Medications Ordered in ED Medications  sodium chloride flush (NS) 0.9 % injection 3 mL (3 mLs Intravenous Not Given 02/10/19 0313)  acetaminophen (TYLENOL) tablet 325 mg (has no administration in time range)  aspirin EC tablet 81 mg (has no administration in time range)  atorvastatin (LIPITOR) tablet 40 mg (has no administration in time range)  clopidogrel (PLAVIX) tablet 75 mg (has no administration in time range)  citalopram (CELEXA) tablet 20 mg (has no administration in time range)  ferrous sulfate  tablet 325 mg (has no administration in time range)  pantoprazole (PROTONIX) EC tablet 40 mg (has no  administration in time range)  potassium chloride SA (K-DUR) CR tablet 20 mEq (has no administration in time range)  torsemide (DEMADEX) tablet 40-80 mg (has no administration in time range)  carvedilol (COREG) tablet 3.125 mg (has no administration in time range)  albuterol (PROVENTIL) (2.5 MG/3ML) 0.083% nebulizer solution 2.5 mg (has no administration in time range)  NON FORMULARY 50 mg (has no administration in time range)     Initial Impression / Assessment and Plan / ED Course  I have reviewed the triage vital signs and the nursing notes.  Pertinent labs & imaging results that were available during my care of the patient were reviewed by me and considered in my medical decision making (see chart for details).        68 year old male presents to the emergency department for evaluation of hallucinations x4 days.  No recent medication changes.  He has been medically cleared and evaluated by TTS who recommend inpatient geriatric psych placement for treatment and stabilization.  Disposition to be determined by oncoming ED provider.  Vitals:   02/09/19 2106 02/09/19 2107 02/09/19 2329 02/10/19 0209  BP:  112/72 98/67 101/72  Pulse:  87 (!) 101 86  Resp:  18 18 16   Temp:  98.5 F (36.9 C) 98.7 F (37.1 C)   TempSrc:  Oral Oral   SpO2:  98% 97% 98%  Weight: 63.5 kg     Height: 6' (1.829 m)       Final Clinical Impressions(s) / ED Diagnoses   Final diagnoses:  Hallucinations    ED Discharge Orders    None       Antonietta Breach, PA-C 02/10/19 0519    Merrily Pew, MD 02/10/19 0710

## 2019-02-10 NOTE — ED Notes (Signed)
Breakfast tray at bedside 

## 2019-02-10 NOTE — Progress Notes (Signed)
Pt. meets criteria for inpatient treatment per , NP.  No appropriate beds available at [facility]. Referred out to the following hospitals: Patient is under review at the following: [facility]   [facility]    Disposition CSW will continue to follow for placement.

## 2019-02-10 NOTE — ED Notes (Signed)
ED TO INPATIENT HANDOFF REPORT  ED Nurse Name and Phone #:   S Name/Age/Gender Carl [name] 68 y.o. male Room/Bed: 019C/019C  Code Status   Code Status: Full Code  Home/SNF/Other Home Patient oriented to: self Is this baseline? No   Triage Complete: Triage complete  Chief Complaint AMS,Arm Numbness  Triage Note Pt in POV with daughter. Per family pt has been "talking crazy" X few days, has been having auditory and visual hallucinations. Was seen by PCP and instructed to come here for a psych eval. Pt is A/OX3. Has no complaints at this time.    Allergies Allergies  Allergen Reactions  . Spironolactone Other (See Comments)    gynecomastia     Level of Care/Admitting Diagnosis ED Disposition    None      B Medical/Surgery History Past Medical History:  Diagnosis Date  . AICD (automatic cardioverter/defibrillator) present   . Anemia   . Anxiety   . Arthritis   . Brunner's gland hyperplasia of duodenum   . CHF (congestive heart failure) ([place])   . Coronary artery disease   . External hemorrhoids   . Fracture of femoral neck, left ([place]) 07/21/2018  . GERD (gastroesophageal reflux disease)   . Hearing loss   . HH (hiatus hernia)   . Internal hemorrhoids   . Ischemic cardiomyopathy   . Leg fracture, left   . Myocardial infarction ([place])   . Paronychia   . Pneumonia   . Psoriasis   . PUD (peptic ulcer disease)   . Schatzki's ring   . Tubular adenoma of colon   . Vitiligo    Past Surgical History:  Procedure Laterality Date  . ATRIAL SEPTAL DEFECT(ASD) CLOSURE N/A 12/30/2018   Procedure: ATRIAL SEPTAL DEFECT(ASD) CLOSURE;  Surgeon: [name], MD;  Location: [facility];  Service: Cardiovascular;  Laterality: N/A;  . CARDIAC SURGERY    . COLONOSCOPY WITH ESOPHAGOGASTRODUODENOSCOPY (EGD)    . COLONOSCOPY WITH PROPOFOL N/A 08/24/2017   Procedure: COLONOSCOPY WITH PROPOFOL;  Surgeon: [name], MD;  Location: [facility] ENDOSCOPY;  Service:  Endoscopy;  Laterality: N/A;  . CORONARY ARTERY BYPASS GRAFT N/A 08/03/2014   Procedure: CORONARY ARTERY BYPASS GRAFTING (CABG);  Surgeon: [name], MD;  Location: [facility];  Service: Open Heart Surgery;  Laterality: N/A;  . EP IMPLANTABLE DEVICE N/A 05/01/2015   MDT ICD implanted for primary prevention of sudden death  . ESOPHAGOGASTRODUODENOSCOPY (EGD) WITH PROPOFOL N/A 04/16/2015   Procedure: ESOPHAGOGASTRODUODENOSCOPY (EGD) WITH PROPOFOL;  Surgeon: [name], MD;  Location: [facility] ENDOSCOPY;  Service: Endoscopy;  Laterality: N/A;  . ESOPHAGOGASTRODUODENOSCOPY (EGD) WITH PROPOFOL N/A 08/24/2017   Procedure: ESOPHAGOGASTRODUODENOSCOPY (EGD) WITH PROPOFOL;  Surgeon: [name], MD;  Location: [facility] ENDOSCOPY;  Service: Endoscopy;  Laterality: N/A;  . FRACTURE SURGERY    . HERNIA REPAIR    . HIP PINNING,CANNULATED Left 07/22/2018   Procedure: CANNULATED HIP PINNING;  Surgeon: [name], MD;  Location: [facility];  Service: Orthopedics;  Laterality: Left;  . MITRAL VALVE REPAIR N/A 12/30/2018   Procedure: MITRAL VALVE REPAIR;  Surgeon: [name], MD;  Location: [facility];  Service: Cardiovascular;  Laterality: N/A;  . RIGHT/LEFT HEART CATH AND CORONARY/GRAFT ANGIOGRAPHY N/A 12/21/2018   Procedure: RIGHT/LEFT HEART CATH AND CORONARY/GRAFT ANGIOGRAPHY;  Surgeon: [name], MD;  Location: [facility];  Service: Cardiovascular;  Laterality: N/A;  . TEE WITHOUT CARDIOVERSION N/A 08/03/2014   Procedure: TRANSESOPHAGEAL ECHOCARDIOGRAM (TEE);  Surgeon: [name],  MD;  Location: [facility];  Service: Open Heart Surgery;  Laterality: N/A;  . TEE WITHOUT CARDIOVERSION N/A 02/10/2018   Procedure: TRANSESOPHAGEAL ECHOCARDIOGRAM (TEE);  Surgeon: [name], MD;  [facility];  Service: Cardiovascular;  Laterality: N/A;     A IV Location/Drains/Wounds Patient Lines/Drains/Airways Status   Active Line/Drains/Airways    Name:   Placement date:    Placement time:   Site:   Days:   Incision (Closed) 07/22/18 Hip   07/22/18    1023     203          Intake/Output Last 24 hours No intake or output data in the 24 hours ending 02/10/19 0730  Labs/Imaging Results for orders placed or performed during the hospital encounter of 02/09/19 (from the past 48 hour(s))  Urinalysis, Routine w reflex microscopic     Status: None   Collection Time: 02/09/19  9:19 PM  Result Value Ref Range   Color, Urine YELLOW YELLOW   APPearance CLEAR CLEAR   Specific Gravity, Urine 1.009 1.005 - 1.030   pH 5.0 5.0 - 8.0   Glucose, UA NEGATIVE NEGATIVE mg/dL   Hgb urine dipstick NEGATIVE NEGATIVE   Bilirubin Urine NEGATIVE NEGATIVE   Ketones, ur NEGATIVE NEGATIVE mg/dL   Protein, ur NEGATIVE NEGATIVE mg/dL   Nitrite NEGATIVE NEGATIVE   Leukocytes,Ua NEGATIVE NEGATIVE    Comment: Performed at [facility], [address], [place], [place] [address]  Rapid urine drug screen (hospital performed)     Status: None   Collection Time: 02/09/19  9:19 PM  Result Value Ref Range   Opiates NONE DETECTED NONE DETECTED   Cocaine NONE DETECTED NONE DETECTED   Benzodiazepines NONE DETECTED NONE DETECTED   Amphetamines NONE DETECTED NONE DETECTED   Tetrahydrocannabinol NONE DETECTED NONE DETECTED   Barbiturates NONE DETECTED NONE DETECTED    Comment: (NOTE) DRUG SCREEN FOR MEDICAL PURPOSES ONLY.  IF CONFIRMATION IS NEEDED FOR ANY PURPOSE, NOTIFY LAB WITHIN 5 DAYS. LOWEST DETECTABLE LIMITS FOR URINE DRUG SCREEN Drug Class                     Cutoff (ng/mL) Amphetamine and metabolites    1000 Barbiturate and metabolites    200 Benzodiazepine                 378 Tricyclics and metabolites     300 Opiates and metabolites        300 Cocaine and metabolites        300 THC                            50 Performed at [facility], [place] [address], [place], [place] [address]   Comprehensive metabolic panel     Status: Abnormal   Collection Time:  02/09/19  9:29 PM  Result Value Ref Range   Sodium 127 (L) 135 - 145 mmol/L   Potassium 4.3 3.5 - 5.1 mmol/L   Chloride 92 (L) 98 - 111 mmol/L   CO2 24 22 - 32 mmol/L   Glucose, Bld 186 (H) 70 - 99 mg/dL   BUN 28 (H) 8 - 23 mg/dL   Creatinine, Ser 1.75 (H) 0.61 - 1.24 mg/dL   Calcium 8.9 8.9 - 10.3 mg/dL   Total Protein 7.3 6.5 - 8.1 g/dL   Albumin 3.6 3.5 - 5.0 g/dL   AST 28 15 - 41 U/L  ALT 18 0 - 44 U/L   Alkaline Phosphatase 92 38 - 126 U/L   Total Bilirubin 1.8 (H) 0.3 - 1.2 mg/dL   GFR calc non Af Amer 39 (L) >60 mL/min   GFR calc Af Amer 46 (L) >60 mL/min   Anion gap 11 5 - 15    Comment: Performed at [facility] [address] [place], [place] [address]  CBC     Status: Abnormal   Collection Time: 02/09/19  9:29 PM  Result Value Ref Range   WBC 7.6 4.0 - 10.5 K/uL   RBC 3.01 (L) 4.22 - 5.81 MIL/uL   Hemoglobin 9.9 (L) 13.0 - 17.0 g/dL   HCT 30.3 (L) 39.0 - 52.0 %   MCV 100.7 (H) 80.0 - 100.0 fL   MCH 32.9 26.0 - 34.0 pg   MCHC 32.7 30.0 - 36.0 g/dL   RDW 15.5 11.5 - 15.5 %   Platelets 199 150 - 400 K/uL   nRBC 0.0 0.0 - 0.2 %    Comment: Performed at [facility], [place] [address], [place], [place] [address]  Acetaminophen level     Status: Abnormal   Collection Time: 02/09/19  9:29 PM  Result Value Ref Range   Acetaminophen (Tylenol), Serum <10 (L) 10 - 30 ug/mL    Comment: (NOTE) Therapeutic concentrations vary significantly. A range of 10-30 ug/mL  may be an effective concentration for many patients. However, some  are best treated at concentrations outside of this range. Acetaminophen concentrations >150 ug/mL at 4 hours after ingestion  and >50 ug/mL at 12 hours after ingestion are often associated with  toxic reactions. Performed at [facility], [place] [address] [place], [place] [address]   Salicylate level     Status: None   Collection Time: 02/09/19  9:29 PM  Result Value Ref Range   Salicylate Lvl <8.2 2.8 - 30.0 mg/dL     Comment: Performed at [facility] [address] [place], [place] [address]  Ethanol     Status: None   Collection Time: 02/09/19  9:29 PM  Result Value Ref Range   Alcohol, Ethyl (B) <10 <10 mg/dL    Comment: (NOTE) Lowest detectable limit for serum alcohol is 10 mg/dL. For medical purposes only. Performed at [facility], [place] [address], [place], [place] [address]    Ct Head Wo Contrast  Result Date: 02/10/2019 CLINICAL DATA:  Altered mental status. EXAM: CT HEAD WITHOUT CONTRAST TECHNIQUE: Contiguous axial images were obtained from the base of the skull through the vertex without intravenous contrast. COMPARISON:  Head CT 10/16/2018 FINDINGS: Brain: No intracranial hemorrhage, mass effect, or midline shift. Unchanged degree of atrophy and chronic small vessel ischemia. No hydrocephalus. The basilar cisterns are patent. No evidence of territorial infarct or acute ischemia. No extra-axial or intracranial fluid collection. Vascular: Atherosclerosis of skullbase vasculature without hyperdense vessel or abnormal calcification. Skull: No fracture or focal lesion. Sinuses/Orbits: Paranasal sinuses and mastoid air cells are clear. The visualized orbits are unremarkable. Other: None. IMPRESSION: 1. No acute intracranial abnormality. 2. Unchanged atrophy and chronic small vessel ischemia. Electronically Signed   By: Keith Rake M.D.   On: 02/10/2019 02:07    Pending Labs Unresulted Labs (From admission, onward)    Start     Ordered   02/10/19 0512  Novel Coronavirus,NAA,(SEND-OUT TO REF LAB - TAT 24-48 hrs); Hosp Order  (Asymptomatic Patients Labs)  Once,   STAT  Question:  Rule Out  Answer:  Yes   02/10/19 0511          Vitals/Pain Today's Vitals   02/09/19 2118 02/09/19 2329 02/10/19 0209 02/10/19 0630  BP:  98/67 101/72 99/74  Pulse:  (!) 101 86 83  Resp:  18 16   Temp:  98.7 F (37.1 C)    TempSrc:  Oral    SpO2:  97% 98% 100%  Weight:      Height:       PainSc: 0-No pain       Isolation Precautions No active isolations  Medications Medications  sodium chloride flush (NS) 0.9 % injection 3 mL (3 mLs Intravenous Not Given 02/10/19 0313)  acetaminophen (TYLENOL) tablet 325 mg (has no administration in time range)  aspirin EC tablet 81 mg (has no administration in time range)  atorvastatin (LIPITOR) tablet 40 mg (has no administration in time range)  clopidogrel (PLAVIX) tablet 75 mg (has no administration in time range)  citalopram (CELEXA) tablet 20 mg (has no administration in time range)  ferrous sulfate tablet 325 mg (has no administration in time range)  pantoprazole (PROTONIX) EC tablet 40 mg (has no administration in time range)  potassium chloride SA (K-DUR) CR tablet 20 mEq (has no administration in time range)  torsemide (DEMADEX) tablet 40-80 mg (has no administration in time range)  carvedilol (COREG) tablet 3.125 mg (has no administration in time range)  albuterol (PROVENTIL) (2.5 MG/3ML) 0.083% nebulizer solution 2.5 mg (has no administration in time range)  eplerenone (INSPRA) tablet 50 mg (has no administration in time range)    Mobility walks with device Low fall risk   Focused Assessments Neuro Assessment Handoff:           Neuro Assessment: Exceptions to WDL Neuro Checks:      Last Documented NIHSS Modified Score:   Has TPA been given? No If patient is a Neuro Trauma and patient is going to OR before floor call report to [place] nurse: [phone] or [phone]     R Recommendations: See Admitting Provider Note  Report given to:   Additional Notes:

## 2019-02-10 NOTE — ED Notes (Signed)
Pt taken to CT.

## 2019-02-10 NOTE — ED Notes (Signed)
Patient verbalizes understanding of discharge instructions. Opportunity for questioning and answers were provided. Armband removed by staff, pt discharged from ED.  

## 2019-02-10 NOTE — Progress Notes (Signed)
Daughter, Barnett Applebaum, called to tell us that her aunt wants to speak to someone about the patient.  I explained again that we would not be able to speak to anyone but her as she is the patient's closest next of kin and brought him to the hospital. Daughter asked how her father felt about being in the hospital and I explained that I was not in the same location but gave daughter the number to the Psych ED.  Patient is currently here on a Voluntary basis.  It is not clear if he has the cognitive ability to make his own decisions.  Areatha Keas. Judi Cong, MSW, [place] Disposition Clinical Social Work [phone] (cell) [phone] (office)  Addendum:  CSW contacted by patient's RN.  Patient's family now requesting that he be d/c'd to home.  CSW will discuss with [place], Marvia Pickles, NP and will follow up with family.

## 2019-02-10 NOTE — Progress Notes (Addendum)
CSW contacted patient's RN, Tawanna Solo, who reports that patient's brother, Carl [name], called and has identified himself as the patient's POA.  CSW explained we do not have that documentation in the patient's chart and would need that prior to give patient's brother any other information.  CSW agreed to contact patient's daughter to ask who in the family can be a single point of contact in order to make communicating with them more effective.  CSW then called the patient's daughter, Carl [name], who confirms that the patient's brother is his POA [place].  CSW explained that until and unless we had a copy of the POA document, we would not be able to communicate with her uncle about the patient's medical status.  Carl [name] expressed understanding and agreed to be the single point of contact for the family for the immediate future,  Carl [name] asked if the patient could be visited and Building surveyor explained that adults could not have visitors in the ED or anywhere in the hospital setting.    CSW agreed to keep daughter apprised of patient's status.  CSW then contacted patient's brother, Carl [name] [phone]) and spoke to him explaining the need to have documentation of his POA in order to talk to him any farther.  Mr. Norberto disclosed that he is not legally the patient's POA but does take him to doctor's appointments, arranged for housing for the patient and is generally his caregiver.  He related that he is the one who told patient's daughter to bring him to the ED last night.  CSW acknowledged brother's caregiver role but explained that HIPAA laws prohibited [place] from talking to him at this time.  CSW assured patient's brother that information about any transfer for treatment or discharge plan would be relayed to patient's daughter.  Patient's brother expressed understanding.  Carl [name]. Judi Cong, MSW, [place] Disposition Clinical Social Work [phone] (cell) [phone] (office)

## 2019-02-10 NOTE — Progress Notes (Signed)
Patient seen and assessed by Marvia Pickles, NP, who agrees that patient may return home with his daughter and referrals to Outpatient Psychiatry and therapy as well as information about applying for guardianship and/or Healthcare POA.  Areatha Keas. Judi Cong, MSW, [place] Disposition Clinical Social Work [phone] (cell) [phone] (office)

## 2019-02-10 NOTE — Consult Note (Signed)
Telepsych Consultation   Reason for Consult:  Hallucinations Referring Physician:  EDP Location of Patient:  Location of Provider: [place] Department  Patient Identification: Carl [name] MRN:  [id number] Principal Diagnosis: Hallucinations Diagnosis:  Principal Problem:   Hallucinations   Total Time spent with patient: 20 minutes  Subjective:   Carl [name] is a 68 y.o. male patient reports that he has not had any hallucinations since he has been at the hospital.  Patient denies any suicidal homicidal ideations.  Patient agrees to let his daughter stay with him to remain safe.  He states that he would let his daughter know if he starts having any other hallucinations. I contacted patient's daughter, Carl [name] at [phone], and she reports that she feels safe taking her father home.  She states that they have came up with a safety plan and she will need to be staying with him or he will be staying with her.  She reports that if he does start to having worsening symptoms then she will bring him back to the hospital but was happy to hear that he has not had any hallucinations since he has been at the hospital.  HPI:  68 y.o. male who was brought to [facility] by his daughter due to concerns that pt was not thinking clearly, including thinking things were coming out of his behind, stating that people were living in his attic, and crying in the middle of conversations for no apparent reason. Pt shares he has no hx of depression, SI, and SA and states he is not experiencing any of those currently. Pt's CT scan and urine has been scanned and is clear. Pt's daughter verified pt has had no recent falls and that none of his medication that could have an effect on his behaviors has been changed. Pt denies SI, prior hospitalizations for MH, depression, prior thoughts or current thoughts of killing himself, or prior attempts to kill himself. Pt denies HI. Pt verifies AVH but denies AVH in the  past. Pt's daughter was present throughout the entirety of the assessment with pt's verbal consent and was able to provide some of the information above. Pt is oriented x3; he was unaware of the month. Pt's recent and remote memory was intact. Pt was pleasant and cooperative throughout the assessment. Pt's insight, judgement, and impulse control is fair at this time.  Patient is seen by me via tele-psych and I have consulted with Dr. Dwyane Dee. Patient does not meet inpatient criteria and is psychiatrically cleared. Family has set up a safety plan and wants to take the patient home. I have contacted Dr. Venora Maples and notified him of the recommendations.  Past Psychiatric History: Denies  Risk to Self: Suicidal Ideation: No Suicidal Intent: No Is patient at risk for suicide?: No Suicidal Plan?: No Access to Means: No What has been your use of drugs/alcohol within the last 12 months?: Pt denies SA How many times?: 0 Other Self Harm Risks: None noted Triggers for Past Attempts: None known Intentional Self Injurious Behavior: None Risk to Others: Homicidal Ideation: No Thoughts of Harm to Others: No Current Homicidal Intent: No Current Homicidal Plan: No Access to Homicidal Means: No Identified Victim: None noted History of harm to others?: No Assessment of Violence: On admission Violent Behavior Description: None noted Does patient have access to weapons?: No(Pt and his daughter denies access to guns/weapons) Criminal Charges Pending?: No Does patient have a court date: No Prior Inpatient Therapy: Prior Inpatient Therapy: No  Prior Outpatient Therapy: Prior Outpatient Therapy: No Does patient have an ACCT team?: No Does patient have Intensive In-House Services?  : No Does patient have [facility] services? : No Does patient have P4CC services?: No  Past Medical History:  Past Medical History:  Diagnosis Date  . AICD (automatic cardioverter/defibrillator) present   . Anemia   . Anxiety   .  Arthritis   . Brunner's gland hyperplasia of duodenum   . CHF (congestive heart failure) ([place])   . Coronary artery disease   . External hemorrhoids   . Fracture of femoral neck, left ([place]) 07/21/2018  . GERD (gastroesophageal reflux disease)   . Hearing loss   . HH (hiatus hernia)   . Internal hemorrhoids   . Ischemic cardiomyopathy   . Leg fracture, left   . Myocardial infarction ([place])   . Paronychia   . Pneumonia   . Psoriasis   . PUD (peptic ulcer disease)   . Schatzki's ring   . Tubular adenoma of colon   . Vitiligo     Past Surgical History:  Procedure Laterality Date  . ATRIAL SEPTAL DEFECT(ASD) CLOSURE N/A 12/30/2018   Procedure: ATRIAL SEPTAL DEFECT(ASD) CLOSURE;  Surgeon: [name], MD;  Location: [facility];  Service: Cardiovascular;  Laterality: N/A;  . CARDIAC SURGERY    . COLONOSCOPY WITH ESOPHAGOGASTRODUODENOSCOPY (EGD)    . COLONOSCOPY WITH PROPOFOL N/A 08/24/2017   Procedure: COLONOSCOPY WITH PROPOFOL;  Surgeon: [name], MD;  Location: [facility] ENDOSCOPY;  Service: Endoscopy;  Laterality: N/A;  . CORONARY ARTERY BYPASS GRAFT N/A 08/03/2014   Procedure: CORONARY ARTERY BYPASS GRAFTING (CABG);  Surgeon: [name], MD;  Location: [place];  Service: Open Heart Surgery;  Laterality: N/A;  . EP IMPLANTABLE DEVICE N/A 05/01/2015   MDT ICD implanted for primary prevention of sudden death  . ESOPHAGOGASTRODUODENOSCOPY (EGD) WITH PROPOFOL N/A 04/16/2015   Procedure: ESOPHAGOGASTRODUODENOSCOPY (EGD) WITH PROPOFOL;  Surgeon: [name], MD;  Location: [facility] ENDOSCOPY;  Service: Endoscopy;  Laterality: N/A;  . ESOPHAGOGASTRODUODENOSCOPY (EGD) WITH PROPOFOL N/A 08/24/2017   Procedure: ESOPHAGOGASTRODUODENOSCOPY (EGD) WITH PROPOFOL;  Surgeon: [name], MD;  Location: [facility] ENDOSCOPY;  Service: Endoscopy;  Laterality: N/A;  . FRACTURE SURGERY    . HERNIA REPAIR    . HIP PINNING,CANNULATED Left 07/22/2018   Procedure: CANNULATED HIP PINNING;   Surgeon: [name], MD;  Location: [place];  Service: Orthopedics;  Laterality: Left;  . MITRAL VALVE REPAIR N/A 12/30/2018   Procedure: MITRAL VALVE REPAIR;  Surgeon: [name], MD;  Location: [facility] [facility];  Service: Cardiovascular;  Laterality: N/A;  . RIGHT/LEFT HEART CATH AND CORONARY/GRAFT ANGIOGRAPHY N/A 12/21/2018   Procedure: RIGHT/LEFT HEART CATH AND CORONARY/GRAFT ANGIOGRAPHY;  Surgeon: [name], MD;  Location: [place] [facility];  Service: Cardiovascular;  Laterality: N/A;  . TEE WITHOUT CARDIOVERSION N/A 08/03/2014   Procedure: TRANSESOPHAGEAL ECHOCARDIOGRAM (TEE);  Surgeon: [name], MD;  Location: [place];  Service: Open Heart Surgery;  Laterality: N/A;  . TEE WITHOUT CARDIOVERSION N/A 02/10/2018   Procedure: TRANSESOPHAGEAL ECHOCARDIOGRAM (TEE);  Surgeon: [name], MD;  Location: [facility] ENDOSCOPY;  Service: Cardiovascular;  Laterality: N/A;   Family History:  Family History  Problem Relation Age of Onset  . Valvular heart disease Mother        Ruptured valve  . CAD Father   . Heart Problems Brother        Stents x 4  . Diabetes Brother   . Prostate cancer  Neg Hx   . Bladder Cancer Neg Hx   . Kidney cancer Neg Hx    Family Psychiatric  History: Denies Social History:  Social History   Substance and Sexual Activity  Alcohol Use No  . Alcohol/week: 0.0 standard drinks     Social History   Substance and Sexual Activity  Drug Use No    Social History   Socioeconomic History  . Marital status: Single    Spouse name: Not on file  . Number of children: Not on file  . Years of education: Not on file  . Highest education level: Not on file  Occupational History  . Not on file  Social Needs  . Financial resource strain: Not hard at all  . Food insecurity    Worry: Never true    Inability: Never true  . Transportation needs    Medical: No    Non-medical: No  Tobacco Use  . Smoking status: Former Smoker    Quit date: 2014    Years  since quitting: 6.4  . Smokeless tobacco: Never Used  . Tobacco comment: 07/30/2017 Quit in 2014  Substance and Sexual Activity  . Alcohol use: No    Alcohol/week: 0.0 standard drinks  . Drug use: No  . Sexual activity: Never  Lifestyle  . Physical activity    Days per week: Not on file    Minutes per session: Not on file  . Stress: Not on file  Relationships  . Social Herbalist on phone: Not on file    Gets together: Not on file    Attends religious service: Not on file    Active member of club or organization: Not on file    Attends meetings of clubs or organizations: Not on file    Relationship status: Not on file  Other Topics Concern  . Not on file  Social History Narrative   Pt lives in [place] with his mother.   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 Spironolactone Other (See Comments)    gynecomastia     Labs:  Results for orders placed or performed during the hospital encounter of 02/09/19 (from the past 48 hour(s))  Urinalysis, Routine w reflex microscopic     Status: None   Collection Time: 02/09/19  9:19 PM  Result Value Ref Range   Color, Urine YELLOW YELLOW   APPearance CLEAR CLEAR   Specific Gravity, Urine 1.009 1.005 - 1.030   pH 5.0 5.0 - 8.0   Glucose, UA NEGATIVE NEGATIVE mg/dL   Hgb urine dipstick NEGATIVE NEGATIVE   Bilirubin Urine NEGATIVE NEGATIVE   Ketones, ur NEGATIVE NEGATIVE mg/dL   Protein, ur NEGATIVE NEGATIVE mg/dL   Nitrite NEGATIVE NEGATIVE   Leukocytes,Ua NEGATIVE NEGATIVE    Comment: Performed at [facility] [address], [place], [place] [address]  Rapid urine drug screen (hospital performed)     Status: None   Collection Time: 02/09/19  9:19 PM  Result Value Ref Range   Opiates NONE DETECTED NONE DETECTED   Cocaine NONE DETECTED NONE DETECTED   Benzodiazepines NONE DETECTED NONE DETECTED   Amphetamines NONE DETECTED NONE  DETECTED   Tetrahydrocannabinol NONE DETECTED NONE DETECTED   Barbiturates NONE DETECTED NONE DETECTED    Comment: (NOTE) DRUG SCREEN FOR MEDICAL PURPOSES ONLY.  IF CONFIRMATION IS NEEDED FOR ANY PURPOSE, NOTIFY LAB WITHIN 5 DAYS. LOWEST DETECTABLE  LIMITS FOR URINE DRUG SCREEN Drug Class                     Cutoff (ng/mL) Amphetamine and metabolites    1000 Barbiturate and metabolites    200 Benzodiazepine                 502 Tricyclics and metabolites     300 Opiates and metabolites        300 Cocaine and metabolites        300 THC                            50 Performed at [facility], [place] [address] [place], [place] [address]   Comprehensive metabolic panel     Status: Abnormal   Collection Time: 02/09/19  9:29 PM  Result Value Ref Range   Sodium 127 (L) 135 - 145 mmol/L   Potassium 4.3 3.5 - 5.1 mmol/L   Chloride 92 (L) 98 - 111 mmol/L   CO2 24 22 - 32 mmol/L   Glucose, Bld 186 (H) 70 - 99 mg/dL   BUN 28 (H) 8 - 23 mg/dL   Creatinine, Ser 1.75 (H) 0.61 - 1.24 mg/dL   Calcium 8.9 8.9 - 10.3 mg/dL   Total Protein 7.3 6.5 - 8.1 g/dL   Albumin 3.6 3.5 - 5.0 g/dL   AST 28 15 - 41 U/L   ALT 18 0 - 44 U/L   Alkaline Phosphatase 92 38 - 126 U/L   Total Bilirubin 1.8 (H) 0.3 - 1.2 mg/dL   GFR calc non Af Amer 39 (L) >60 mL/min   GFR calc Af Amer 46 (L) >60 mL/min   Anion gap 11 5 - 15    Comment: Performed at [facility], [place] [address] [place], [place] [address]  CBC     Status: Abnormal   Collection Time: 02/09/19  9:29 PM  Result Value Ref Range   WBC 7.6 4.0 - 10.5 K/uL   RBC 3.01 (L) 4.22 - 5.81 MIL/uL   Hemoglobin 9.9 (L) 13.0 - 17.0 g/dL   HCT 30.3 (L) 39.0 - 52.0 %   MCV 100.7 (H) 80.0 - 100.0 fL   MCH 32.9 26.0 - 34.0 pg   MCHC 32.7 30.0 - 36.0 g/dL   RDW 15.5 11.5 - 15.5 %   Platelets 199 150 - 400 K/uL   nRBC 0.0 0.0 - 0.2 %    Comment: Performed at [facility], [place] [address], [place], [place] [address]  Acetaminophen level      Status: Abnormal   Collection Time: 02/09/19  9:29 PM  Result Value Ref Range   Acetaminophen (Tylenol), Serum <10 (L) 10 - 30 ug/mL    Comment: (NOTE) Therapeutic concentrations vary significantly. A range of 10-30 ug/mL  may be an effective concentration for many patients. However, some  are best treated at concentrations outside of this range. Acetaminophen concentrations >150 ug/mL at 4 hours after ingestion  and >50 ug/mL at 12 hours after ingestion are often associated with  toxic reactions. Performed at [facility], [place] [address] [place], [place] [address]   Salicylate level     Status: None   Collection Time: 02/09/19  9:29 PM  Result Value Ref Range   Salicylate Lvl <2.8 2.8 - 30.0 mg/dL    Comment: Performed at [facility]  [address], [place], [place] [address]  Ethanol     Status: None   Collection Time: 02/09/19  9:29 PM  Result Value Ref Range   Alcohol, Ethyl (B) <10 <10 mg/dL    Comment: (NOTE) Lowest detectable limit for serum alcohol is 10 mg/dL. For medical purposes only. Performed at [facility], [place] [address] [place], [place] [address]     Medications:  Current Facility-Administered Medications  Medication Dose Route Frequency Provider Last Rate Last Dose  . acetaminophen (TYLENOL) tablet 325 mg  325 mg Oral Q6H PRN Antonietta Breach, PA-C      . albuterol (PROVENTIL) (2.5 MG/3ML) 0.083% nebulizer solution 2.5 mg  2.5 mg Nebulization Q6H PRN Antonietta Breach, PA-C      . aspirin EC tablet 81 mg  81 mg Oral Daily Antonietta Breach, PA-C   81 mg at 02/10/19 0858  . atorvastatin (LIPITOR) tablet 40 mg  40 mg Oral q1800 Antonietta Breach, PA-C      . carvedilol (COREG) tablet 3.125 mg  3.125 mg Oral BID WC Antonietta Breach, PA-C   3.125 mg at 02/10/19 0859  . citalopram (CELEXA) tablet 20 mg  20 mg Oral Daily Antonietta Breach, PA-C   20 mg at 02/10/19 0856  . clopidogrel (PLAVIX) tablet 75 mg  75 mg Oral Q breakfast Antonietta Breach, PA-C   75 mg at 02/10/19  0857  . eplerenone (INSPRA) tablet 50 mg  50 mg Oral Daily Mesner, Jason, MD   50 mg at 02/10/19 0906  . ferrous sulfate tablet 325 mg  325 mg Oral Q breakfast Antonietta Breach, PA-C   325 mg at 02/10/19 0857  . pantoprazole (PROTONIX) EC tablet 40 mg  40 mg Oral Daily Antonietta Breach, PA-C   40 mg at 02/10/19 0857  . potassium chloride SA (K-DUR) CR tablet 20 mEq  20 mEq Oral BID Antonietta Breach, PA-C   20 mEq at 02/10/19 4008  . sodium chloride flush (NS) 0.9 % injection 3 mL  3 mL Intravenous Once Deno Etienne, DO      . torsemide (DEMADEX) tablet 40-80 mg  40-80 mg Oral BID Antonietta Breach, PA-C       Current Outpatient Medications  Medication Sig Dispense Refill  . acetaminophen (TYLENOL) 325 MG tablet Take 325 mg by mouth every 6 (six) hours as needed for mild pain or moderate pain.     Marland Kitchen albuterol (PROVENTIL) (2.5 MG/3ML) 0.083% nebulizer solution Take 3 mLs by nebulization every 6 (six) hours as needed for wheezing or shortness of breath.     . allopurinol (ZYLOPRIM) 100 MG tablet Take 100 mg by mouth daily.    . Ascorbic Acid (VITAMIN C) 100 MG tablet Take 100 mg by mouth daily.    Marland Kitchen aspirin EC 81 MG tablet Take 1 tablet (81 mg total) by mouth daily. 90 tablet 3  . atorvastatin (LIPITOR) 40 MG tablet Take 1 tablet (40 mg total) by mouth daily at 6 PM. 90 tablet 3  . carvedilol (COREG) 3.125 MG tablet Take 1 tablet (3.125 mg total) by mouth 2 (two) times daily. 180 tablet 3  . Cholecalciferol (VITAMIN D3 SUPER STRENGTH) 50 MCG (2000 UT) TABS Take 2,000 Units by mouth daily.     . citalopram (CELEXA) 20 MG tablet Take 20 mg by mouth daily.   5  . clopidogrel (PLAVIX) 75 MG tablet Take 1 tablet (75 mg total) by mouth daily with breakfast. 90 tablet 0  . cyanocobalamin 500 MCG tablet Take 500 mcg  by mouth daily.    Marland Kitchen docusate sodium (COLACE) 100 MG capsule Take 100 mg by mouth daily.    . DR SMITHS DIAPER 10 % OINT Apply 1 application topically daily as needed (IRRITATION).     Marland Kitchen eplerenone (INSPRA) 50  MG tablet Take 1 tablet (50 mg total) by mouth daily. 30 tablet 6  . ferrous sulfate 325 (65 FE) MG tablet Take 1 tablet (325 mg total) by mouth daily with breakfast. 30 tablet 3  . ketoconazole (NIZORAL) 2 % shampoo Apply 1 application topically 2 (two) times a week.     . Multiple Vitamin (MULTIVITAMIN) tablet Take 1 tablet by mouth daily.    . pantoprazole (PROTONIX) 40 MG tablet Take 1 tablet (40 mg total) by mouth daily. 90 tablet 0  . potassium chloride SA (K-DUR) 20 MEQ tablet Take 1 tablet (20 mEq total) by mouth 2 (two) times daily. 180 tablet 3  . torsemide (DEMADEX) 20 MG tablet Take 80mg  (4 tablets) in the AM and 40mg  (2 tablets) in the PM. 180 tablet 6    Musculoskeletal: Strength & Muscle Tone: within normal limits Gait & Station: normal Patient leans: N/A  Psychiatric Specialty Exam: Physical Exam  Nursing note and vitals reviewed. Constitutional: He is oriented to person, place, and time. He appears well-developed and well-nourished.  HENT:  Right Ear: Decreased hearing is noted.  Left Ear: Decreased hearing is noted.  Cardiovascular: Normal rate.  Respiratory: Effort normal.  Musculoskeletal: Normal range of motion.  Neurological: He is alert and oriented to person, place, and time.  Skin: Skin is warm.    Review of Systems  Constitutional: Negative.   HENT: Negative.   Eyes: Negative.   Respiratory: Negative.   Cardiovascular: Negative.   Gastrointestinal: Negative.   Genitourinary: Negative.   Musculoskeletal: Negative.   Skin: Negative.   Neurological: Negative.   Endo/Heme/Allergies: Negative.   Psychiatric/Behavioral: Negative.     Blood pressure 119/77, pulse 89, temperature 98.7 F (37.1 C), temperature source Oral, resp. rate 17, height 6' (1.829 m), weight 63.5 kg, SpO2 100 %.Body mass index is 18.99 kg/m.  General Appearance: Casual  Eye Contact:  Good  Speech:  Clear and Coherent and Normal Rate  Volume:  Normal  Mood:  Euthymic  Affect:   Congruent  Thought Process:  Coherent and Descriptions of Associations: Intact  Orientation:  Full (Time, Place, and Person)  Thought Content:  WDL  Suicidal Thoughts:  No  Homicidal Thoughts:  No  Memory:  Immediate;   Fair Recent;   Fair Remote;   Good  Judgement:  Fair  Insight:  Fair  Psychomotor Activity:  Normal  Concentration:  Concentration: Good and Attention Span: Good  Recall:  Good  Fund of Knowledge:  Good  Language:  Good  Akathisia:  No  Handed:  Right  AIMS (if indicated):     Assets:  Communication Skills Desire for Improvement Financial Resources/Insurance Housing Resilience Social Support Transportation  ADL's:  Intact  Cognition:  WNL  Sleep:        Treatment Plan Summary: Plan is to:  Follow-up with outpatient providers Return to hospital if symptoms worsen Continue prescription medications  Disposition: No evidence of imminent risk to self or others at present.   Patient does not meet criteria for psychiatric inpatient admission. Supportive therapy provided about ongoing stressors. Discussed crisis plan, support from social network, calling 911, coming to the Emergency Department, and calling Suicide Hotline.  This service was provided via telemedicine using  a 2-way, interactive audio and Radiographer, therapeutic.  Names of all persons participating in this telemedicine service and their role in this encounter. Name: Warner Mccreedy Role: Patient  Name: Carl [name] (phone call) Role: patient's daughter  Name: Marvia Pickles NP Role: Provider  Name:  Role:     Carl Shock, FNP 02/10/2019 3:00 PM

## 2019-02-10 NOTE — ED Notes (Signed)
Pt's belongings inventoried, pt placed into purple scrubs

## 2019-02-10 NOTE — ED Notes (Addendum)
Patient's daughter Barnett Applebaum) called requesting that patient come home with counseling [facility] social worker notified

## 2019-02-10 NOTE — ED Notes (Signed)
TTS being done at bedside, daughter still present in room.

## 2019-02-10 NOTE — BH Assessment (Addendum)
Tele Assessment Note   Patient Name: Carl [name] MRN: [id number] Referring Physician: Dr. Merrily Pew, MD Location of Patient: [facility] Location of Provider: [place]  Carl [name] is a 68 y.o. male who was brought to [facility] by his daughter due to concerns that pt was not thinking clearly, including thinking things were coming out of his behind, stating that people were living in his attic, and crying in the middle of conversations for no apparent reason. Pt shares he has no hx of depression, SI, and SA and states he is not experiencing any of those currently. Pt's CT scan and urine has been scanned and is clear. Pt's daughter verified pt has had no recent falls and that none of his medication that could have an effect on his behaviors has been changed.  Pt denies SI, prior hospitalizations for MH, depression, prior thoughts or current thoughts of killing himself, or prior attempts to kill himself. Pt denies HI. Pt verifies AVH but denies AVH in the past.  Pt's daughter was present throughout the entirety of the assessment with pt's verbal consent and was able to provide some of the information above.  Pt is oriented x3; he was unaware of the month. Pt's recent and remote memory was intact. Pt was pleasant and cooperative throughout the assessment. Pt's insight, judgement, and impulse control is fair at this time.   Diagnosis: F29, Unspecified other psychotic disorder   Past Medical History:  Past Medical History:  Diagnosis Date  . AICD (automatic cardioverter/defibrillator) present   . Anemia   . Anxiety   . Arthritis   . Brunner's gland hyperplasia of duodenum   . CHF (congestive heart failure) ([place])   . Coronary artery disease   . External hemorrhoids   . Fracture of femoral neck, left ([employer]) 07/21/2018  . GERD (gastroesophageal reflux disease)   . Hearing loss   . HH (hiatus hernia)   . Internal hemorrhoids   . Ischemic cardiomyopathy   . Leg  fracture, left   . Myocardial infarction ([place])   . Paronychia   . Pneumonia   . Psoriasis   . PUD (peptic ulcer disease)   . Schatzki's ring   . Tubular adenoma of colon   . Vitiligo     Past Surgical History:  Procedure Laterality Date  . ATRIAL SEPTAL DEFECT(ASD) CLOSURE N/A 12/30/2018   Procedure: ATRIAL SEPTAL DEFECT(ASD) CLOSURE;  Surgeon: [name], MD;  Location: [place] CV LAB;  Service: Cardiovascular;  Laterality: N/A;  . CARDIAC SURGERY    . COLONOSCOPY WITH ESOPHAGOGASTRODUODENOSCOPY (EGD)    . COLONOSCOPY WITH PROPOFOL N/A 08/24/2017   Procedure: COLONOSCOPY WITH PROPOFOL;  Surgeon: [name], MD;  Location: [facility] ENDOSCOPY;  Service: Endoscopy;  Laterality: N/A;  . CORONARY ARTERY BYPASS GRAFT N/A 08/03/2014   Procedure: CORONARY ARTERY BYPASS GRAFTING (CABG);  Surgeon: [name], MD;  Location: [place];  Service: Open Heart Surgery;  Laterality: N/A;  . EP IMPLANTABLE DEVICE N/A 05/01/2015   MDT ICD implanted for primary prevention of sudden death  . ESOPHAGOGASTRODUODENOSCOPY (EGD) WITH PROPOFOL N/A 04/16/2015   Procedure: ESOPHAGOGASTRODUODENOSCOPY (EGD) WITH PROPOFOL;  Surgeon: [name], MD;  Location: [facility] ENDOSCOPY;  Service: Endoscopy;  Laterality: N/A;  . ESOPHAGOGASTRODUODENOSCOPY (EGD) WITH PROPOFOL N/A 08/24/2017   Procedure: ESOPHAGOGASTRODUODENOSCOPY (EGD) WITH PROPOFOL;  Surgeon: [name], MD;  Location: [facility] ENDOSCOPY;  Service: Endoscopy;  Laterality: N/A;  . FRACTURE SURGERY    . HERNIA REPAIR    .  HIP PINNING,CANNULATED Left 07/22/2018   Procedure: CANNULATED HIP PINNING;  Surgeon: [name], MD;  Location: [place];  Service: Orthopedics;  Laterality: Left;  . MITRAL VALVE REPAIR N/A 12/30/2018   Procedure: MITRAL VALVE REPAIR;  Surgeon: [name], MD;  Location: [place] CV LAB;  Service: Cardiovascular;  Laterality: N/A;  . RIGHT/LEFT HEART CATH AND CORONARY/GRAFT ANGIOGRAPHY N/A 12/21/2018   Procedure:  RIGHT/LEFT HEART CATH AND CORONARY/GRAFT ANGIOGRAPHY;  Surgeon: [name], MD;  Location: [place] CV LAB;  Service: Cardiovascular;  Laterality: N/A;  . TEE WITHOUT CARDIOVERSION N/A 08/03/2014   Procedure: TRANSESOPHAGEAL ECHOCARDIOGRAM (TEE);  Surgeon: [name], MD;  Location: [place];  Service: Open Heart Surgery;  Laterality: N/A;  . TEE WITHOUT CARDIOVERSION N/A 02/10/2018   Procedure: TRANSESOPHAGEAL ECHOCARDIOGRAM (TEE);  Surgeon: [name], MD;  Location: [facility] ENDOSCOPY;  Service: Cardiovascular;  Laterality: N/A;    Family History:  Family History  Problem Relation Age of Onset  . Valvular heart disease Mother        Ruptured valve  . CAD Father   . Heart Problems Brother        Stents x 4  . Diabetes Brother   . Prostate cancer Neg Hx   . Bladder Cancer Neg Hx   . Kidney cancer Neg Hx     Social History:  reports that he quit smoking about 6 years ago. He has never used smokeless tobacco. He reports that he does not drink alcohol or use drugs.  Additional Social History:  Alcohol / Drug Use Pain Medications: Please see MAR Prescriptions: Please see MAR Over the Counter: Please see MAR History of alcohol / drug use?: No history of alcohol / drug abuse Longest period of sobriety (when/how long): Pt denies SA  CIWA: CIWA-Ar BP: 101/72 Pulse Rate: 86 COWS:    Allergies:  Allergies  Allergen Reactions  . Spironolactone Other (See Comments)    gynecomastia     Home Medications: (Not in a hospital admission)   OB/GYN Status:  No LMP for male patient.  General Assessment Data Assessment unable to be completed: Yes Reason for not completing assessment: Multiple assessments ordered simultaneously Location of Assessment: [facility] ED TTS Assessment: In system Is this a Tele or Face-to-Face Assessment?: Tele Assessment Is this an Initial Assessment or a Re-assessment for this encounter?: Initial Assessment Patient Accompanied by:: Adult(Pt's daughter,  Carl [name], was in attendance with her father) Permission Given to speak with another: Yes Name, Relationship and Phone Number: Carl [name], pt's daughter, was present during assessment Language Other than English: No Living Arrangements: Other (Comment)(Pt lives in his own home) What gender do you identify as?: Male Marital status: Widowed Maiden name: Child psychotherapist Pregnancy Status: No Living Arrangements: Alone Can pt return to current living arrangement?: Yes Admission Status: Voluntary Is patient capable of signing voluntary admission?: Yes Referral Source: Self/Family/Friend Insurance type: [employer]     Crisis Care Plan Living Arrangements: Alone Legal Guardian: Other:(Self) Name of Psychiatrist: None noted Name of Therapist: None noted  Education Status Is patient currently in school?: No Is the patient employed, unemployed or receiving disability?: Receiving disability income  Risk to self with the past 6 months Suicidal Ideation: No Has patient been a risk to self within the past 6 months prior to admission? : No Suicidal Intent: No Has patient had any suicidal intent within the past 6 months prior to admission? : No Is patient at risk for suicide?: No Suicidal Plan?: No Has  patient had any suicidal plan within the past 6 months prior to admission? : No Access to Means: No What has been your use of drugs/alcohol within the last 12 months?: Pt denies SA Previous Attempts/Gestures: No How many times?: 0 Other Self Harm Risks: None noted Triggers for Past Attempts: None known Intentional Self Injurious Behavior: None Family Suicide History: Unknown Recent stressful life event(s): Recent negative physical changes Persecutory voices/beliefs?: No Depression: No Substance abuse history and/or treatment for substance abuse?: No Suicide prevention information given to non-admitted patients: Not applicable  Risk to Others within the past 6 months Homicidal  Ideation: No Does patient have any lifetime risk of violence toward others beyond the six months prior to admission? : No Thoughts of Harm to Others: No Current Homicidal Intent: No Current Homicidal Plan: No Access to Homicidal Means: No Identified Victim: None noted History of harm to others?: No Assessment of Violence: On admission Violent Behavior Description: None noted Does patient have access to weapons?: No(Pt and his daughter denies access to guns/weapons) Criminal Charges Pending?: No Does patient have a court date: No Is patient on probation?: No  Psychosis Hallucinations: Auditory, Visual Delusions: None noted  Mental Status Report Appearance/Hygiene: Unremarkable Eye Contact: Good Motor Activity: Unremarkable Speech: Logical/coherent Level of Consciousness: Alert Mood: Pleasant Affect: Appropriate to circumstance Anxiety Level: None Thought Processes: Coherent, Relevant Judgement: Unimpaired Orientation: Unable to assess Obsessive Compulsive Thoughts/Behaviors: None  Cognitive Functioning Concentration: Normal Memory: Unable to Assess Is patient IDD: No Insight: Good Impulse Control: Good Appetite: (UTA) Have you had any weight changes? : (UTA) Sleep: Unable to Assess Total Hours of Sleep: (UTA) Vegetative Symptoms: Unable to Assess  ADLScreening [facility] Assessment Services) Patient's cognitive ability adequate to safely complete daily activities?: Yes Patient able to express need for assistance with ADLs?: Yes Independently performs ADLs?: Yes (appropriate for developmental age)  Prior Inpatient Therapy Prior Inpatient Therapy: No  Prior Outpatient Therapy Prior Outpatient Therapy: No Does patient have an ACCT team?: No Does patient have Intensive In-House Services?  : No Does patient have [facility] services? : No Does patient have P4CC services?: No  ADL Screening (condition at time of admission) Patient's cognitive ability adequate to safely  complete daily activities?: Yes Is the patient deaf or have difficulty hearing?: Yes Does the patient have difficulty seeing, even when wearing glasses/contacts?: No Does the patient have difficulty concentrating, remembering, or making decisions?: Yes Patient able to express need for assistance with ADLs?: Yes Does the patient have difficulty dressing or bathing?: No Independently performs ADLs?: Yes (appropriate for developmental age) Does the patient have difficulty walking or climbing stairs?: No Weakness of Legs: Both Weakness of Arms/Hands: None  Home Assistive Devices/Equipment Home Assistive Devices/Equipment: Environmental consultant (specify type)  Therapy Consults (therapy consults require a physician order) PT Evaluation Needed: No OT Evalulation Needed: No SLP Evaluation Needed: No Abuse/Neglect Assessment (Assessment to be complete while patient is alone) Abuse/Neglect Assessment Can Be Completed: Unable to assess, patient is non-responsive or altered mental status Values / Beliefs Cultural Requests During Hospitalization: None Spiritual Requests During Hospitalization: None Consults Spiritual Care Consult Needed: No Social Work Consult Needed: No Regulatory affairs officer (For Healthcare) Does Patient Have a Medical Advance Directive?: Unable to assess, patient is non-responsive or altered mental status        Disposition: Lindon Romp, NP, reviewed pt's chart and information and determined pt meets inpatient geri psych hospitalization. This information was provided to pt's nurse, Carl [name], at [phone].   Disposition Initial Assessment Completed  for this Encounter: Yes  This service was provided via telemedicine using a 2-way, interactive audio and video technology.  Names of all persons participating in this telemedicine service and their role in this encounter. Name: Carl [name] Role: Patient  Name: Carl [name] Role: Patient's Daughter  Name: Lindon Romp Role: Nurse Practitioner  Name:  Windell Hummingbird Role: Clinician    Dannielle Burn 02/10/2019 4:36 AM

## 2019-02-10 NOTE — ED Notes (Addendum)
Patient's brother(Mike) called, he states he is the POA; asking when PT is coming home.  POA documentation is not found, [name] is encouraged to discuss with patient's daughter as to what the care plan entails.

## 2019-02-10 NOTE — ED Notes (Signed)
Patient's daughter called to notify of d/c She states she is on the way to pick him up

## 2019-02-10 NOTE — ED Notes (Signed)
Patient's belongings placed in locker 1

## 2019-02-10 NOTE — Progress Notes (Signed)
CSW contacted patient's daughter, [name], who confirms that the family and the patient want him to be discharged to home.  Daughter states that they have a safety plan in place that patient will either go to her home to live or she will go to his home, "whichever he prefers."  Family is requesting referrals for outpatient Psychiatry and therapy.  CSW explained that patient would need to be seen by the [place] in order to be cleared and that the decision about whther the patient should be inpatient or not was largely up to that provider.  Areatha Keas. Judi Cong, MSW, [place] Disposition Clinical Social Work [phone] (cell) [phone] (office)

## 2019-02-11 ENCOUNTER — Other Ambulatory Visit: Payer: Self-pay | Admitting: Cardiovascular Disease

## 2019-02-11 ENCOUNTER — Encounter: Payer: Self-pay | Admitting: Thoracic Surgery (Cardiothoracic Vascular Surgery)

## 2019-02-11 LAB — NOVEL CORONAVIRUS, NAA (HOSP ORDER, SEND-OUT TO REF LAB; TAT 18-24 HRS): SARS-CoV-2, NAA: NOT DETECTED

## 2019-02-15 ENCOUNTER — Other Ambulatory Visit (HOSPITAL_COMMUNITY): Payer: Self-pay

## 2019-02-15 NOTE — Progress Notes (Signed)
Carl [name] is doing better this week, appears to more like his normal.  He is complaining of his ankles hurting, he was diagnosed with gout.  Did discover he has been hiding pepsi.  Did advise him he needs to drink the water with koolaid in it, better for him.  He states he will, his daughter states she will encourage him.  He has no swelling in ankles or legs.  He is down 1 pound this week.  Denies chest pain, shortness of breath, headaches or dizziness.  He is walking around without cane and appears to be doing good.  He has not been hallucinating this week.  He has been taking his medications.  Meds verified and med box refilled.  Called in torsemide and celexa for refills.  Lungs are clear.  Discussed diet with Carl [name] and his daughter and to chose low sodium foods for him to eat.  Over heard her talking about getting him food from [employer].  Will continue to visit with Carl [name] for heart failure, diet and medication compliance.   Carl [name] [phone]

## 2019-02-17 ENCOUNTER — Telehealth (HOSPITAL_COMMUNITY): Payer: Self-pay

## 2019-02-17 NOTE — Telephone Encounter (Signed)
Carl [name] wanted to advise me that Carl [name] seen his orthopaedic yesterday and told him his leg hurt.  He gave Carl [name] a taper prednisone pack but Carl [name] had just gave him 1 last week and he just finished.  He states he is going to hold off giving it to him since orthopaedic did not know he just finished a tapered pack.  We talked about Carl [name] binging habits on foods, he has been drinking a lot of pepsi, sitting down eating snacks til the whole pack is gone, example quart of strawberries, ice cream, popcorn, beef jerky sticks, popsicles and etc.  He will eat the entire package in one sitting.  Advised them to place snacks in proportion zip lock bags for him.  Will continue to work with Carl [name] on eating habits and advised him to wear socks and shoes everyday.   [place] [phone]

## 2019-02-22 ENCOUNTER — Encounter (HOSPITAL_COMMUNITY): Payer: Self-pay

## 2019-02-22 ENCOUNTER — Other Ambulatory Visit (HOSPITAL_COMMUNITY): Payer: Self-pay

## 2019-02-22 NOTE — Progress Notes (Signed)
Today was a visit at [place] home.  He states doing ok, seen chiropractor about pain in neck and back.  He states his ankles are feeling some better but his leg hurts all the time.  He states not drinking sodas, been drinking kool aid in water this week.  His family did package snacks in zip lock bags for him.  He states eating better.  He has no swelling in lower extremities, denies abdominal tightness.  Ge denies chest pain, shortness of breath, headaches or dizziness.  His daughter is still staying with him.  He has been taking his meds according to his boxes.  Meds refilled and meds placed in boxes.  He did not get his torsemide, called [facility] and they are filling it.  Will go next week and place in boxes.  He has enough for this week.  His brother is here today, he spoke with Prospero by phone today.  Will continue to visit for heart failure, medication compliance and diet.   [place] [phone]

## 2019-02-24 ENCOUNTER — Telehealth (HOSPITAL_COMMUNITY): Payer: Self-pay

## 2019-02-24 NOTE — Telephone Encounter (Signed)
Confirmation of fax received.

## 2019-02-24 NOTE — Telephone Encounter (Signed)
Orders signed [facility] to [facility].  Copy of plan of care placed in folder for [facility].

## 2019-02-28 ENCOUNTER — Other Ambulatory Visit (HOSPITAL_COMMUNITY): Payer: Self-pay

## 2019-02-28 ENCOUNTER — Telehealth: Payer: Self-pay | Admitting: Student

## 2019-02-28 ENCOUNTER — Encounter (HOSPITAL_COMMUNITY): Payer: Self-pay

## 2019-02-28 NOTE — Telephone Encounter (Signed)
HF team requested a transmission due to volume overload.  His brother is going to let him know, as we were unable to reach. Will review and forward to CMA, Adolm Joseph with HF once results available.     Legrand Como [address] [place], PA-C 02/28/2019 2:16 PM

## 2019-02-28 NOTE — Telephone Encounter (Signed)
Voice mail box not set up.

## 2019-02-28 NOTE — Progress Notes (Signed)
Today went to [place] to place torsemide in remaining of his boxes but no one has picked them up.  Contacted [name] and [place] daughter gina and she states she will go by and pick it up today.  Will go back out tomorrow to place it.  Carl [name] has had some weight gain, his daughter has been cooking and he has been eating more.  He has some swelling in r leg and has none in left.  He denies abdomen feeling tight.  He denies chest pain, shortness of breath, dizziness or headaches.  When palpate his r ankle he grimaces with pain, he has gout he is taking meds for.  Lungs are clear.  Contacted HF clinic and advised of weight gain.  Had Scottie to send his reading in.  He has been taking his medications according to his med boxes.  Will continue to visit for heart failure, med compliance and diet.   [place] [phone]

## 2019-03-01 ENCOUNTER — Other Ambulatory Visit (HOSPITAL_COMMUNITY): Payer: Self-pay

## 2019-03-01 NOTE — Telephone Encounter (Signed)
Spoke with Carl [name] with paramedicine again. MDT states that part of his monitor is not charging. They recommended trying again in 1 hour, and if it does not work this time, he will need a new monitor.

## 2019-03-01 NOTE — Telephone Encounter (Signed)
Spoke with [place] with paramedicine. Monitor gave error code 3230. Given number to [employer] tech support.   Will await transmission

## 2019-03-01 NOTE — Progress Notes (Signed)
Today went out to see Carl [name], his family has picked up his torsemide.  Placed this in his med boxes.  Also contacted [place] Carl [name] and went through [place] connecting up to his device, received an error code 3230.  Contacted the company and they advised not charging right, to try again in an hour and if does not work need to order new device.  Carl [name] attempted an hour later and still did not work.  I have contacted them back and they will be sending him a new monitor to be sent out.  Carl [name] states not feeling bad, his legs hurt, he has no swelling today in either leg.  He states he has been watching his eating better past couple of a days.  He is back drinking water with flavoring.  His family has packaged his snacks in zip lock bags for him to keep him from eating so much.  He denies chest pain, shortness of breath, headaches or dizziness.   [place] [address] [phone]

## 2019-03-02 NOTE — Telephone Encounter (Signed)
Replacement monitor ordered 03/01/2019.  Should arrive within 7-10 business days.    Legrand Como "[employer], PA-C 03/02/2019 8:00 AM

## 2019-03-03 ENCOUNTER — Inpatient Hospital Stay
Admission: EM | Admit: 2019-03-03 | Discharge: 2019-03-05 | DRG: 194 | Disposition: A | Discharge status: Home Health | Payer: Medicare Other | Attending: Internal Medicine | Admitting: Internal Medicine

## 2019-03-03 ENCOUNTER — Emergency Department: Payer: Medicare Other

## 2019-03-03 ENCOUNTER — Other Ambulatory Visit: Payer: Self-pay

## 2019-03-03 DIAGNOSIS — J189 Pneumonia, unspecified organism: Secondary | ICD-10-CM | POA: Diagnosis not present

## 2019-03-03 DIAGNOSIS — R112 Nausea with vomiting, unspecified: Secondary | ICD-10-CM

## 2019-03-03 DIAGNOSIS — Z79899 Other long term (current) drug therapy: Secondary | ICD-10-CM

## 2019-03-03 DIAGNOSIS — F411 Generalized anxiety disorder: Secondary | ICD-10-CM | POA: Diagnosis present

## 2019-03-03 DIAGNOSIS — R109 Unspecified abdominal pain: Secondary | ICD-10-CM | POA: Diagnosis not present

## 2019-03-03 DIAGNOSIS — Z7902 Long term (current) use of antithrombotics/antiplatelets: Secondary | ICD-10-CM

## 2019-03-03 DIAGNOSIS — I130 Hypertensive heart and chronic kidney disease with heart failure and stage 1 through stage 4 chronic kidney disease, or unspecified chronic kidney disease: Secondary | ICD-10-CM | POA: Diagnosis present

## 2019-03-03 DIAGNOSIS — I255 Ischemic cardiomyopathy: Secondary | ICD-10-CM | POA: Diagnosis present

## 2019-03-03 DIAGNOSIS — Z951 Presence of aortocoronary bypass graft: Secondary | ICD-10-CM

## 2019-03-03 DIAGNOSIS — I5022 Chronic systolic (congestive) heart failure: Secondary | ICD-10-CM | POA: Diagnosis present

## 2019-03-03 DIAGNOSIS — Z7982 Long term (current) use of aspirin: Secondary | ICD-10-CM

## 2019-03-03 DIAGNOSIS — I4510 Unspecified right bundle-branch block: Secondary | ICD-10-CM | POA: Diagnosis present

## 2019-03-03 DIAGNOSIS — Y95 Nosocomial condition: Secondary | ICD-10-CM | POA: Diagnosis present

## 2019-03-03 DIAGNOSIS — Z8249 Family history of ischemic heart disease and other diseases of the circulatory system: Secondary | ICD-10-CM

## 2019-03-03 DIAGNOSIS — D509 Iron deficiency anemia, unspecified: Secondary | ICD-10-CM | POA: Diagnosis present

## 2019-03-03 DIAGNOSIS — I252 Old myocardial infarction: Secondary | ICD-10-CM

## 2019-03-03 DIAGNOSIS — K219 Gastro-esophageal reflux disease without esophagitis: Secondary | ICD-10-CM | POA: Diagnosis present

## 2019-03-03 DIAGNOSIS — Z87891 Personal history of nicotine dependence: Secondary | ICD-10-CM

## 2019-03-03 DIAGNOSIS — I10 Essential (primary) hypertension: Secondary | ICD-10-CM | POA: Diagnosis present

## 2019-03-03 DIAGNOSIS — I2510 Atherosclerotic heart disease of native coronary artery without angina pectoris: Secondary | ICD-10-CM | POA: Diagnosis present

## 2019-03-03 DIAGNOSIS — Z95810 Presence of automatic (implantable) cardiac defibrillator: Secondary | ICD-10-CM

## 2019-03-03 DIAGNOSIS — E785 Hyperlipidemia, unspecified: Secondary | ICD-10-CM | POA: Diagnosis present

## 2019-03-03 DIAGNOSIS — Z888 Allergy status to other drugs, medicaments and biological substances status: Secondary | ICD-10-CM

## 2019-03-03 DIAGNOSIS — N183 Chronic kidney disease, stage 3 unspecified: Secondary | ICD-10-CM | POA: Diagnosis present

## 2019-03-03 DIAGNOSIS — M810 Age-related osteoporosis without current pathological fracture: Secondary | ICD-10-CM | POA: Diagnosis present

## 2019-03-03 DIAGNOSIS — Z20828 Contact with and (suspected) exposure to other viral communicable diseases: Secondary | ICD-10-CM | POA: Diagnosis present

## 2019-03-03 LAB — URINALYSIS, COMPLETE (UACMP) WITH MICROSCOPIC
Bacteria, UA: NONE SEEN
Bilirubin Urine: NEGATIVE
Glucose, UA: NEGATIVE mg/dL
Hgb urine dipstick: NEGATIVE
Ketones, ur: NEGATIVE mg/dL
Leukocytes,Ua: NEGATIVE
Nitrite: NEGATIVE
Protein, ur: NEGATIVE mg/dL
Specific Gravity, Urine: 1.01 (ref 1.005–1.030)
pH: 6 (ref 5.0–8.0)

## 2019-03-03 LAB — CBC WITH DIFFERENTIAL/PLATELET
Abs Immature Granulocytes: 0.07 10*3/uL (ref 0.00–0.07)
Basophils Absolute: 0 10*3/uL (ref 0.0–0.1)
Basophils Relative: 0 %
Eosinophils Absolute: 0 10*3/uL (ref 0.0–0.5)
Eosinophils Relative: 0 %
HCT: 30.6 % — ABNORMAL LOW (ref 39.0–52.0)
Hemoglobin: 9.9 g/dL — ABNORMAL LOW (ref 13.0–17.0)
Immature Granulocytes: 1 %
Lymphocytes Relative: 6 %
Lymphs Abs: 0.7 10*3/uL (ref 0.7–4.0)
MCH: 32.9 pg (ref 26.0–34.0)
MCHC: 32.4 g/dL (ref 30.0–36.0)
MCV: 101.7 fL — ABNORMAL HIGH (ref 80.0–100.0)
Monocytes Absolute: 0.9 10*3/uL (ref 0.1–1.0)
Monocytes Relative: 7 %
Neutro Abs: 10.6 10*3/uL — ABNORMAL HIGH (ref 1.7–7.7)
Neutrophils Relative %: 86 %
Platelets: 233 10*3/uL (ref 150–400)
RBC: 3.01 MIL/uL — ABNORMAL LOW (ref 4.22–5.81)
RDW: 16.1 % — ABNORMAL HIGH (ref 11.5–15.5)
WBC: 12.4 10*3/uL — ABNORMAL HIGH (ref 4.0–10.5)
nRBC: 0 % (ref 0.0–0.2)

## 2019-03-03 LAB — COMPREHENSIVE METABOLIC PANEL
ALT: 15 U/L (ref 0–44)
AST: 26 U/L (ref 15–41)
Albumin: 3.5 g/dL (ref 3.5–5.0)
Alkaline Phosphatase: 69 U/L (ref 38–126)
Anion gap: 11 (ref 5–15)
BUN: 24 mg/dL — ABNORMAL HIGH (ref 8–23)
CO2: 26 mmol/L (ref 22–32)
Calcium: 8.8 mg/dL — ABNORMAL LOW (ref 8.9–10.3)
Chloride: 94 mmol/L — ABNORMAL LOW (ref 98–111)
Creatinine, Ser: 1.37 mg/dL — ABNORMAL HIGH (ref 0.61–1.24)
GFR calc Af Amer: >60 mL/min (ref >60)
GFR calc non Af Amer: 53 mL/min — ABNORMAL LOW (ref >60)
Glucose, Bld: 120 mg/dL — ABNORMAL HIGH (ref 70–99)
Potassium: 3.5 mmol/L (ref 3.5–5.1)
Sodium: 131 mmol/L — ABNORMAL LOW (ref 135–145)
Total Bilirubin: 1.6 mg/dL — ABNORMAL HIGH (ref 0.3–1.2)
Total Protein: 7 g/dL (ref 6.5–8.1)

## 2019-03-03 LAB — TROPONIN I (HIGH SENSITIVITY)
Troponin I (High Sensitivity): 41 ng/L — ABNORMAL HIGH (ref <18)
Troponin I (High Sensitivity): 40 ng/L — ABNORMAL HIGH (ref <18)

## 2019-03-03 LAB — SARS CORONAVIRUS 2 BY RT PCR (HOSPITAL ORDER, PERFORMED IN ~~LOC~~ HOSPITAL LAB): SARS Coronavirus 2: NEGATIVE

## 2019-03-03 LAB — LACTIC ACID, PLASMA: Lactic Acid, Venous: 0.9 mmol/L (ref 0.5–1.9)

## 2019-03-03 LAB — LIPASE, BLOOD: Lipase: 36 U/L (ref 11–51)

## 2019-03-03 MED ORDER — ONDANSETRON HCL 4 MG/2ML IJ SOLN
4.0000 mg | Freq: Once | INTRAMUSCULAR | Status: AC
  Administered 2019-03-03: 21:00:00 4 mg via INTRAVENOUS
  Filled 2019-03-03: qty 2

## 2019-03-03 MED ORDER — IOHEXOL 300 MG/ML  SOLN
100.0000 mL | Freq: Once | INTRAMUSCULAR | Status: AC | PRN
  Administered 2019-03-03: 100 mL via INTRAVENOUS

## 2019-03-03 MED ORDER — SODIUM CHLORIDE 0.9 % IV SOLN
2.0000 g | INTRAVENOUS | Status: DC
  Administered 2019-03-03: 2 g via INTRAVENOUS
  Filled 2019-03-03: qty 20

## 2019-03-03 MED ORDER — HYDROCODONE-ACETAMINOPHEN 5-325 MG PO TABS
1.0000 | ORAL_TABLET | Freq: Once | ORAL | Status: AC
  Administered 2019-03-03: 20:00:00 1 via ORAL
  Filled 2019-03-03: qty 1

## 2019-03-03 MED ORDER — SODIUM CHLORIDE 0.9 % IV BOLUS
250.0000 mL | Freq: Once | INTRAVENOUS | Status: AC
  Administered 2019-03-03: 22:00:00 250 mL via INTRAVENOUS

## 2019-03-03 MED ORDER — SODIUM CHLORIDE 0.9 % IV BOLUS: 500.0000 mL | Freq: Once | INTRAVENOUS | Status: DC

## 2019-03-03 MED ORDER — SODIUM CHLORIDE 0.9 % IV SOLN
500.0000 mg | INTRAVENOUS | Status: DC
  Administered 2019-03-03: 500 mg via INTRAVENOUS
  Filled 2019-03-03: qty 500

## 2019-03-03 NOTE — ED Provider Notes (Addendum)
[facility] Emergency Department Provider Note    First MD Initiated Contact with Patient 03/03/19 1941     (approximate)  I have reviewed the triage vital signs and the nursing notes.   HISTORY  Chief Complaint Flank Pain    HPI Carl [name] is a 68 y.o. male below listed past medical history presents the ER per recommendation of his PCP for evaluation of back pain epigastric pain and vomiting.  Patient somewhat of a poor historian.  States he has been having bilateral back pain denies any fevers.  Not currently have any nausea but did admit to vomiting prior to arrival.  States he went to the chiropractor today for neck and back pain and was feeling better after that but then started having the lower back pain.  He denies any chest pain or shortness of breath.  Called the patient's brother for collateral states that he is otherwise been behaving normally but is somewhat of a poor historian at baseline.  They are primarily concerned about new back pain with the nausea and vomiting make sure is not anything going on in his abdomen.  He does have a history of chronic back pain but this did seem different.    Past Medical History:  Diagnosis Date  . AICD (automatic cardioverter/defibrillator) present   . Anemia   . Anxiety   . Arthritis   . Brunner's gland hyperplasia of duodenum   . CHF (congestive heart failure) ([place])   . Coronary artery disease   . External hemorrhoids   . Fracture of femoral neck, left ([place]) 07/21/2018  . GERD (gastroesophageal reflux disease)   . Hearing loss   . HH (hiatus hernia)   . Internal hemorrhoids   . Ischemic cardiomyopathy   . Leg fracture, left   . Myocardial infarction ([place])   . Paronychia   . Pneumonia   . Psoriasis   . PUD (peptic ulcer disease)   . Schatzki's ring   . Tubular adenoma of colon   . Vitiligo    Family History  Problem Relation Age of Onset  . Valvular heart disease Mother        Ruptured  valve  . CAD Father   . Heart Problems Brother        Stents x 4  . Diabetes Brother   . Prostate cancer Neg Hx   . Bladder Cancer Neg Hx   . Kidney cancer Neg Hx    Past Surgical History:  Procedure Laterality Date  . ATRIAL SEPTAL DEFECT(ASD) CLOSURE N/A 12/30/2018   Procedure: ATRIAL SEPTAL DEFECT(ASD) CLOSURE;  Surgeon: [name], MD;  Location: [place] CV [facility];  Service: Cardiovascular;  Laterality: N/A;  . CARDIAC SURGERY    . COLONOSCOPY WITH ESOPHAGOGASTRODUODENOSCOPY (EGD)    . COLONOSCOPY WITH PROPOFOL N/A 08/24/2017   Procedure: COLONOSCOPY WITH PROPOFOL;  Surgeon: [name], MD;  Location: [facility] ENDOSCOPY;  Service: Endoscopy;  Laterality: N/A;  . CORONARY ARTERY BYPASS GRAFT N/A 08/03/2014   Procedure: CORONARY ARTERY BYPASS GRAFTING (CABG);  Surgeon: [name], MD;  Location: [place];  Service: Open Heart Surgery;  Laterality: N/A;  . EP IMPLANTABLE DEVICE N/A 05/01/2015   MDT ICD implanted for primary prevention of sudden death  . ESOPHAGOGASTRODUODENOSCOPY (EGD) WITH PROPOFOL N/A 04/16/2015   Procedure: ESOPHAGOGASTRODUODENOSCOPY (EGD) WITH PROPOFOL;  Surgeon: [name], MD;  Location: [facility] ENDOSCOPY;  Service: Endoscopy;  Laterality: N/A;  . ESOPHAGOGASTRODUODENOSCOPY (EGD) WITH PROPOFOL N/A 08/24/2017  Procedure: ESOPHAGOGASTRODUODENOSCOPY (EGD) WITH PROPOFOL;  Surgeon: [name], MD;  Location: [facility] ENDOSCOPY;  Service: Endoscopy;  Laterality: N/A;  . FRACTURE SURGERY    . HERNIA REPAIR    . HIP PINNING,CANNULATED Left 07/22/2018   Procedure: CANNULATED HIP PINNING;  Surgeon: [name], MD;  Location: [place];  Service: Orthopedics;  Laterality: Left;  . MITRAL VALVE REPAIR N/A 12/30/2018   Procedure: MITRAL VALVE REPAIR;  Surgeon: [name], MD;  Location: [place] [facility];  Service: Cardiovascular;  Laterality: N/A;  . RIGHT/LEFT HEART CATH AND CORONARY/GRAFT ANGIOGRAPHY N/A 12/21/2018   Procedure: RIGHT/LEFT HEART CATH AND  CORONARY/GRAFT ANGIOGRAPHY;  Surgeon: [name], MD;  Location: [place] [facility];  Service: Cardiovascular;  Laterality: N/A;  . TEE WITHOUT CARDIOVERSION N/A 08/03/2014   Procedure: TRANSESOPHAGEAL ECHOCARDIOGRAM (TEE);  Surgeon: [name], MD;  Location: [place];  Service: Open Heart Surgery;  Laterality: N/A;  . TEE WITHOUT CARDIOVERSION N/A 02/10/2018   Procedure: TRANSESOPHAGEAL ECHOCARDIOGRAM (TEE);  Surgeon: [name], MD;  Location: [facility] ENDOSCOPY;  Service: Cardiovascular;  Laterality: N/A;   Patient Active Problem List   Diagnosis Date Noted  . Hallucinations 02/10/2019  . Elevated blood sugar 01/27/2019  . S/P mitral valve repair 12/31/2018  . Bilateral pulmonary contusion 10/17/2018  . Cardiac LV ejection fraction of 20-34% 10/17/2018  . CHF (congestive heart failure) ([place]) 10/17/2018  . History of ischemic cardiomyopathy 10/17/2018  . ICD (implantable cardioverter-defibrillator) in place 10/17/2018  . S/p left hip fracture 10/17/2018  . Traumatic retroperitoneal hematoma 10/17/2018  . Age-related osteoporosis with current pathological fracture with routine healing 07/28/2018  . CAD (coronary artery disease) 07/21/2018  . GERD (gastroesophageal reflux disease) 07/21/2018  . Depression 07/21/2018  . CKD (chronic kidney disease), stage III ([place]) 07/21/2018  . Iron deficiency anemia 07/21/2018  . Hypokalemia 07/21/2018  . Closed left hip fracture ([place]) 07/21/2018  . Fracture of femoral neck, left ([place]) 07/21/2018  . Lower extremity pain, bilateral 11/12/2017  . 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 Gastroesophageal reflux disease with esophagitis 09/14/2017  . Hyperlipidemia 09/01/2017  . Essential hypertension 09/01/2017  . Atherosclerotic peripheral vascular disease with intermittent claudication ([place]) 09/01/2017  . Anemia, unspecified 08/06/2017  . GAD (generalized anxiety disorder) 06/18/2017  . Prostate cancer screening 04/28/2017   . Hydrocele 04/28/2017  . Bradycardia   . Premature ventricular contraction   . Severe mitral insufficiency   . Cardiomyopathy, ischemic 04/26/2015  . Difficulty hearing 12/29/2014  . Acute on chronic systolic CHF (congestive heart failure) ([place]) 10/10/2014  . 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 Protein-calorie malnutrition, severe ([place]) 07/29/2014  . AKI (acute kidney injury) ([place]) 07/29/2014      Prior to Admission medications   Medication Sig Start Date End Date Taking? Authorizing Provider  acetaminophen (TYLENOL) 325 MG tablet Take 325 mg by mouth every 6 (six) hours as needed for mild pain or moderate pain.     [provider]  albuterol (PROVENTIL) (2.5 MG/3ML) 0.083% nebulizer solution Take 3 mLs by nebulization every 6 (six) hours as needed for wheezing or shortness of breath.  11/24/18   [provider]  allopurinol (ZYLOPRIM) 100 MG tablet Take 100 mg by mouth daily. 02/08/19   [provider]  Ascorbic Acid (VITAMIN C) 100 MG tablet Take 100 mg by mouth daily.    [provider]  aspirin EC 81 MG tablet Take 1 tablet (81 mg total) by mouth daily. 08/26/18   [name],  MD  atorvastatin (LIPITOR) 40 MG tablet Take 1 tablet (40 mg total) by mouth daily at 6 PM. 01/10/19   [name], MD  carvedilol (COREG) 3.125 MG tablet Take 1 tablet (3.125 mg total) by mouth 2 (two) times daily. 11/11/18 03/01/19  [name], MD  Cholecalciferol (VITAMIN D3 SUPER STRENGTH) 50 MCG (2000 UT) TABS Take 2,000 Units by mouth daily.     [provider]  citalopram (CELEXA) 20 MG tablet Take 20 mg by mouth daily.  07/09/16   [provider]  clopidogrel (PLAVIX) 75 MG tablet Take 1 tablet (75 mg total) by mouth daily with breakfast. 01/01/19   Eileen Stanford, PA-C  cyanocobalamin 500 MCG tablet Take 500 mcg by mouth daily.    [provider]  docusate sodium (COLACE) 100 MG capsule Take 100 mg by mouth daily.    [provider]  DR SMITHS DIAPER 10 % OINT Apply 1 application topically daily as needed (IRRITATION).  09/01/18   [provider]  eplerenone (INSPRA) 50 MG tablet Take 1 tablet (50 mg total) by mouth daily. 01/12/19   [name], MD  ferrous sulfate 325 (65 FE) MG tablet Take 1 tablet (325 mg total) by mouth daily with breakfast. 02/09/18   [name], MD  ketoconazole (NIZORAL) 2 % shampoo Apply 1 application topically 2 (two) times a week.  09/28/18   [provider]  Multiple Vitamin (MULTIVITAMIN) tablet Take 1 tablet by mouth daily.    [provider]  pantoprazole (PROTONIX) 40 MG tablet Take 1 tablet (40 mg total) by mouth daily. 01/01/19   Eileen Stanford, PA-C  potassium chloride SA (K-DUR) 20 MEQ tablet Take 1 tablet (20 mEq total) by mouth 2 (two) times daily. 01/18/19   Clegg, Amy D, NP  torsemide (DEMADEX) 20 MG tablet Take 80mg  (4 tablets) in the AM and 40mg  (2 tablets) in the PM. 01/18/19   [name], MD    Allergies Spironolactone    Social History Social History   Tobacco Use  . Smoking status: Former Smoker    Quit date: 2014    Years since quitting: 6.5  . Smokeless tobacco: Never Used  . Tobacco comment: 07/30/2017 Quit in 2014  Substance Use Topics  . Alcohol use: No    Alcohol/week: 0.0 standard drinks  . Drug use: No    Review of Systems Patient denies headaches, rhinorrhea, blurry vision, numbness, shortness of breath, chest pain, edema, cough, abdominal pain, nausea, vomiting, diarrhea, dysuria, fevers, rashes or hallucinations unless otherwise stated above in HPI. ____________________________________________   PHYSICAL EXAM:  VITAL SIGNS: Vitals:   03/03/19 1919 03/03/19 2000  BP: 97/67 101/68  Pulse: (!) 50 76  Resp: 15 (!) 22  Temp: 98.6 F (37 C)   SpO2: 96% 96%    Constitutional: Alert, pleasant in NAD Eyes: Conjunctivae are normal.  Head: Atraumatic. Nose: No congestion/rhinnorhea. Mouth/Throat: Mucous  membranes are moist.   Neck: No stridor. Painless ROM.  Cardiovascular: Normal rate, regular rhythm. Grossly normal heart sounds.  Good peripheral circulation. Respiratory: Normal respiratory effort.  No retractions. Lungs CTAB. Gastrointestinal: Soft and nontender. No distention. No abdominal bruits. No CVA tenderness. Genitourinary: deferred Musculoskeletal: No lower extremity tenderness nor edema.  No joint effusions. Neurologic:  MAE spontaneously. No gross focal neurologic deficits are appreciated. No facial droop Skin:  Skin is warm, dry and intact. No rash noted. Psychiatric: Mood and affect are normal. Speech and behavior are  normal.  ____________________________________________   LABS (all labs ordered are listed, but only abnormal results are displayed)  Results for orders placed or performed during the hospital encounter of 03/03/19 (from the past 24 hour(s))  Urinalysis, Complete w Microscopic     Status: Abnormal   Collection Time: 03/03/19  4:00 PM  Result Value Ref Range   Color, Urine YELLOW (A) YELLOW   APPearance HAZY (A) CLEAR   Specific Gravity, Urine 1.010 1.005 - 1.030   pH 6.0 5.0 - 8.0   Glucose, UA NEGATIVE NEGATIVE mg/dL   Hgb urine dipstick NEGATIVE NEGATIVE   Bilirubin Urine NEGATIVE NEGATIVE   Ketones, ur NEGATIVE NEGATIVE mg/dL   Protein, ur NEGATIVE NEGATIVE mg/dL   Nitrite NEGATIVE NEGATIVE   Leukocytes,Ua NEGATIVE NEGATIVE   RBC / HPF 0-5 0 - 5 RBC/hpf   WBC, UA 0-5 0 - 5 WBC/hpf   Bacteria, UA NONE SEEN NONE SEEN   Squamous Epithelial / LPF 0-5 0 - 5   Mucus PRESENT   CBC with Differential/Platelet     Status: Abnormal   Collection Time: 03/03/19  8:10 PM  Result Value Ref Range   WBC 12.4 (H) 4.0 - 10.5 K/uL   RBC 3.01 (L) 4.22 - 5.81 MIL/uL   Hemoglobin 9.9 (L) 13.0 - 17.0 g/dL   HCT 30.6 (L) 39.0 - 52.0 %   MCV 101.7 (H) 80.0 - 100.0 fL   MCH 32.9 26.0 - 34.0 pg   MCHC 32.4 30.0 - 36.0 g/dL   RDW 16.1 (H) 11.5 - 15.5 %   Platelets  233 150 - 400 K/uL   nRBC 0.0 0.0 - 0.2 %   Neutrophils Relative % 86 %   Neutro Abs 10.6 (H) 1.7 - 7.7 K/uL   Lymphocytes Relative 6 %   Lymphs Abs 0.7 0.7 - 4.0 K/uL   Monocytes Relative 7 %   Monocytes Absolute 0.9 0.1 - 1.0 K/uL   Eosinophils Relative 0 %   Eosinophils Absolute 0.0 0.0 - 0.5 K/uL   Basophils Relative 0 %   Basophils Absolute 0.0 0.0 - 0.1 K/uL   Immature Granulocytes 1 %   Abs Immature Granulocytes 0.07 0.00 - 0.07 K/uL  Troponin I (High Sensitivity)     Status: Abnormal   Collection Time: 03/03/19  8:10 PM  Result Value Ref Range   Troponin I (High Sensitivity) 41 (H) <18 ng/L  Lipase, blood     Status: None   Collection Time: 03/03/19  8:10 PM  Result Value Ref Range   Lipase 36 11 - 51 U/L  Comprehensive metabolic panel     Status: Abnormal   Collection Time: 03/03/19  8:10 PM  Result Value Ref Range   Sodium 131 (L) 135 - 145 mmol/L   Potassium 3.5 3.5 - 5.1 mmol/L   Chloride 94 (L) 98 - 111 mmol/L   CO2 26 22 - 32 mmol/L   Glucose, Bld 120 (H) 70 - 99 mg/dL   BUN 24 (H) 8 - 23 mg/dL   Creatinine, Ser 1.37 (H) 0.61 - 1.24 mg/dL   Calcium 8.8 (L) 8.9 - 10.3 mg/dL   Total Protein 7.0 6.5 - 8.1 g/dL   Albumin 3.5 3.5 - 5.0 g/dL   AST 26 15 - 41 U/L   ALT 15 0 - 44 U/L   Alkaline Phosphatase 69 38 - 126 U/L   Total Bilirubin 1.6 (H) 0.3 - 1.2 mg/dL   GFR calc non Af Amer 53 (L) >60 mL/min  GFR calc Af Amer >60 >60 mL/min   Anion gap 11 5 - 15   ____________________________________________  EKG My review and personal interpretation at Time: 21:53   Indication: back pain  Rate: 85  Rhythm: sinus Axis: normal Other: rbbb, nonspecific t wave changes, no stemi ____________________________________________  RADIOLOGY  I personally reviewed all radiographic images ordered to evaluate for the above acute complaints and reviewed radiology reports and findings.  These findings were personally discussed with the patient.  Please see medical record for  radiology report.  ____________________________________________   PROCEDURES  Procedure(s) performed:  Procedures    Critical Care performed: no ____________________________________________   INITIAL IMPRESSION / ASSESSMENT AND PLAN / ED COURSE  Pertinent labs & imaging results that were available during my care of the patient were reviewed by me and considered in my medical decision making (see chart for details).   DDX: Colitis, cholecystitis, cholelithiasis, pancreatitis, appendicitis, pyelonephritis, stone, AAA, fracture, musculoskeletal strain, pneumonia  Carl [name] is a 68 y.o. who presents to the ED with symptoms as described above.  Patient nontoxic-appearing.  Somewhat poor historian but seems like he is primarily here for some back pain as well as nausea and vomiting this morning.  The patient will be placed on continuous pulse oximetry and telemetry for monitoring.  Laboratory evaluation will be sent to evaluate for the above complaints.     Clinical Course as of Mar 03 2155  Thu Mar 03, 2019  2133 Patient is nontoxic is not hypoxic.  Chest x-ray does show possible infiltrate but he is not show any respiratory distress.  Does have mild leukocytosis.  Will check for COVID.  Blood pressure stable.  Initial one was borderline low therefore will add on lactate but have a low suspicion for sepsis. Patient will be signed out to oncoming physician. CT imaging ordered.  Troponin borderline elevated to 41.  Blood pressure is remained borderline low.  Does not seem septic.  Possible dehydration.  Will evaluate for intra-abdominal process.  Anticipate patient may require admission for further medical management.   [PR]    Clinical Course User Index [PR] Merlyn Lot, MD    The patient was evaluated in Emergency Department today for the symptoms described in the history of present illness. He/she was evaluated in the context of the global COVID-19 pandemic, which necessitated  consideration that the patient might be at risk for infection with the SARS-CoV-2 virus that causes COVID-19. Institutional protocols and algorithms that pertain to the evaluation of patients at risk for COVID-19 are in a state of rapid change based on information released by regulatory bodies including the CDC and federal and state organizations. These policies and algorithms were followed during the patient's care in the ED.  As part of my medical decision making, I reviewed the following data within the [place] notes reviewed and incorporated, Labs reviewed, notes from prior ED visits and [place] Controlled Substance Database   ____________________________________________   FINAL CLINICAL IMPRESSION(S) / ED DIAGNOSES  Final diagnoses:  Flank pain  Non-intractable vomiting with nausea, unspecified vomiting type      NEW MEDICATIONS STARTED DURING THIS VISIT:  New Prescriptions   No medications on file     Note:  This document was prepared using Dragon voice recognition software and may include unintentional dictation errors.    Merlyn Lot, MD 03/03/19 2156    Merlyn Lot, MD 03/03/19 2157

## 2019-03-03 NOTE — ED Triage Notes (Signed)
Pt reports that PCP sent him to the ED for c/o pain in neck/flanks - reports hx of pinched nerve - pt PCP stated to pt that he was in so much pain that he needed to be managed in the ED - pt has OTC pain patches in multiple spots on abd and flank

## 2019-03-03 NOTE — ED Notes (Signed)
EMT who helps pt with meds Christy: [phone]

## 2019-03-03 NOTE — Social Work (Signed)
Patient currently open with [facility]. Patient is receiving: RN, PT, OT, nurse aide, and SW.

## 2019-03-03 NOTE — ED Notes (Signed)
Pt given urinal and instructed to hit call bell when through

## 2019-03-03 NOTE — ED Notes (Signed)
PTs sister at bedside

## 2019-03-03 NOTE — ED Notes (Signed)
First nurse note  Family states that he was sent in by PCP b/c or abd pain

## 2019-03-03 NOTE — ED Notes (Signed)
PT c/o broken femur which he states happened in "77", broken hip, pinched nerve, back pain, left arm sore, swelling to leg, forehead being hot, and abd pain.  Sister called to come to ER for more clear story.

## 2019-03-04 ENCOUNTER — Other Ambulatory Visit: Payer: Self-pay

## 2019-03-04 DIAGNOSIS — K219 Gastro-esophageal reflux disease without esophagitis: Secondary | ICD-10-CM | POA: Diagnosis present

## 2019-03-04 DIAGNOSIS — I252 Old myocardial infarction: Secondary | ICD-10-CM | POA: Diagnosis not present

## 2019-03-04 DIAGNOSIS — I2510 Atherosclerotic heart disease of native coronary artery without angina pectoris: Secondary | ICD-10-CM | POA: Diagnosis present

## 2019-03-04 DIAGNOSIS — Z888 Allergy status to other drugs, medicaments and biological substances status: Secondary | ICD-10-CM | POA: Diagnosis not present

## 2019-03-04 DIAGNOSIS — Z20828 Contact with and (suspected) exposure to other viral communicable diseases: Secondary | ICD-10-CM | POA: Diagnosis present

## 2019-03-04 DIAGNOSIS — E785 Hyperlipidemia, unspecified: Secondary | ICD-10-CM | POA: Diagnosis present

## 2019-03-04 DIAGNOSIS — I255 Ischemic cardiomyopathy: Secondary | ICD-10-CM | POA: Diagnosis present

## 2019-03-04 DIAGNOSIS — N183 Chronic kidney disease, stage 3 (moderate): Secondary | ICD-10-CM | POA: Diagnosis present

## 2019-03-04 DIAGNOSIS — I5022 Chronic systolic (congestive) heart failure: Secondary | ICD-10-CM | POA: Diagnosis present

## 2019-03-04 DIAGNOSIS — Z7982 Long term (current) use of aspirin: Secondary | ICD-10-CM | POA: Diagnosis not present

## 2019-03-04 DIAGNOSIS — I130 Hypertensive heart and chronic kidney disease with heart failure and stage 1 through stage 4 chronic kidney disease, or unspecified chronic kidney disease: Secondary | ICD-10-CM | POA: Diagnosis present

## 2019-03-04 DIAGNOSIS — M810 Age-related osteoporosis without current pathological fracture: Secondary | ICD-10-CM | POA: Diagnosis present

## 2019-03-04 DIAGNOSIS — Z8249 Family history of ischemic heart disease and other diseases of the circulatory system: Secondary | ICD-10-CM | POA: Diagnosis not present

## 2019-03-04 DIAGNOSIS — Z951 Presence of aortocoronary bypass graft: Secondary | ICD-10-CM | POA: Diagnosis not present

## 2019-03-04 DIAGNOSIS — R109 Unspecified abdominal pain: Secondary | ICD-10-CM | POA: Diagnosis present

## 2019-03-04 DIAGNOSIS — F411 Generalized anxiety disorder: Secondary | ICD-10-CM | POA: Diagnosis present

## 2019-03-04 DIAGNOSIS — I4510 Unspecified right bundle-branch block: Secondary | ICD-10-CM | POA: Diagnosis present

## 2019-03-04 DIAGNOSIS — Z87891 Personal history of nicotine dependence: Secondary | ICD-10-CM | POA: Diagnosis not present

## 2019-03-04 DIAGNOSIS — J189 Pneumonia, unspecified organism: Secondary | ICD-10-CM | POA: Diagnosis present

## 2019-03-04 DIAGNOSIS — Z79899 Other long term (current) drug therapy: Secondary | ICD-10-CM | POA: Diagnosis not present

## 2019-03-04 DIAGNOSIS — Z95810 Presence of automatic (implantable) cardiac defibrillator: Secondary | ICD-10-CM | POA: Diagnosis not present

## 2019-03-04 DIAGNOSIS — D509 Iron deficiency anemia, unspecified: Secondary | ICD-10-CM | POA: Diagnosis present

## 2019-03-04 DIAGNOSIS — Z7902 Long term (current) use of antithrombotics/antiplatelets: Secondary | ICD-10-CM | POA: Diagnosis not present

## 2019-03-04 DIAGNOSIS — Y95 Nosocomial condition: Secondary | ICD-10-CM | POA: Diagnosis present

## 2019-03-04 LAB — CBC
HCT: 26.2 % — ABNORMAL LOW (ref 39.0–52.0)
Hemoglobin: 8.3 g/dL — ABNORMAL LOW (ref 13.0–17.0)
MCH: 32.7 pg (ref 26.0–34.0)
MCHC: 31.7 g/dL (ref 30.0–36.0)
MCV: 103.1 fL — ABNORMAL HIGH (ref 80.0–100.0)
Platelets: 207 10*3/uL (ref 150–400)
RBC: 2.54 MIL/uL — ABNORMAL LOW (ref 4.22–5.81)
RDW: 16.3 % — ABNORMAL HIGH (ref 11.5–15.5)
WBC: 8.8 10*3/uL (ref 4.0–10.5)
nRBC: 0 % (ref 0.0–0.2)

## 2019-03-04 LAB — BASIC METABOLIC PANEL
Anion gap: 7 (ref 5–15)
BUN: 24 mg/dL — ABNORMAL HIGH (ref 8–23)
CO2: 27 mmol/L (ref 22–32)
Calcium: 8.3 mg/dL — ABNORMAL LOW (ref 8.9–10.3)
Chloride: 101 mmol/L (ref 98–111)
Creatinine, Ser: 1.39 mg/dL — ABNORMAL HIGH (ref 0.61–1.24)
GFR calc Af Amer: >60 mL/min (ref >60)
GFR calc non Af Amer: 52 mL/min — ABNORMAL LOW (ref >60)
Glucose, Bld: 151 mg/dL — ABNORMAL HIGH (ref 70–99)
Potassium: 3.3 mmol/L — ABNORMAL LOW (ref 3.5–5.1)
Sodium: 135 mmol/L (ref 135–145)

## 2019-03-04 LAB — MRSA PCR SCREENING: MRSA by PCR: NEGATIVE

## 2019-03-04 MED ORDER — POTASSIUM CHLORIDE CRYS ER 20 MEQ PO TBCR
40.0000 meq | EXTENDED_RELEASE_TABLET | ORAL | Status: AC
  Administered 2019-03-04 (×2): 40 meq via ORAL
  Filled 2019-03-04 (×2): qty 2

## 2019-03-04 MED ORDER — ACETAMINOPHEN 650 MG RE SUPP: 650.0000 mg | Freq: Four times a day (QID) | RECTAL | Status: DC | PRN

## 2019-03-04 MED ORDER — CLOPIDOGREL BISULFATE 75 MG PO TABS
75.0000 mg | ORAL_TABLET | Freq: Every day | ORAL | Status: DC
  Administered 2019-03-04 – 2019-03-05 (×2): 75 mg via ORAL
  Filled 2019-03-04 (×2): qty 1

## 2019-03-04 MED ORDER — SODIUM CHLORIDE 0.9 % IV BOLUS
500.0000 mL | Freq: Once | INTRAVENOUS | Status: AC
  Administered 2019-03-04: 500 mL via INTRAVENOUS

## 2019-03-04 MED ORDER — PANTOPRAZOLE SODIUM 40 MG PO TBEC
40.0000 mg | DELAYED_RELEASE_TABLET | Freq: Every day | ORAL | Status: DC
  Administered 2019-03-04 – 2019-03-05 (×2): 40 mg via ORAL
  Filled 2019-03-04 (×2): qty 1

## 2019-03-04 MED ORDER — VANCOMYCIN HCL 1.5 G IV SOLR
1500.0000 mg | Freq: Once | INTRAVENOUS | Status: AC
  Administered 2019-03-04: 05:00:00 1500 mg via INTRAVENOUS
  Filled 2019-03-04: qty 1500

## 2019-03-04 MED ORDER — ACETAMINOPHEN 325 MG PO TABS
650.0000 mg | ORAL_TABLET | Freq: Four times a day (QID) | ORAL | Status: DC | PRN
  Administered 2019-03-04 – 2019-03-05 (×2): 650 mg via ORAL
  Filled 2019-03-04 (×2): qty 2

## 2019-03-04 MED ORDER — ONDANSETRON HCL 4 MG PO TABS: 4.0000 mg | ORAL_TABLET | Freq: Four times a day (QID) | ORAL | Status: DC | PRN

## 2019-03-04 MED ORDER — CITALOPRAM HYDROBROMIDE 20 MG PO TABS
20.0000 mg | ORAL_TABLET | Freq: Every day | ORAL | Status: DC
  Administered 2019-03-04 – 2019-03-05 (×2): 20 mg via ORAL
  Filled 2019-03-04 (×2): qty 1

## 2019-03-04 MED ORDER — ONDANSETRON HCL 4 MG/2ML IJ SOLN: 4.0000 mg | Freq: Four times a day (QID) | INTRAMUSCULAR | Status: DC | PRN

## 2019-03-04 MED ORDER — ENOXAPARIN SODIUM 40 MG/0.4ML ~~LOC~~ SOLN
40.0000 mg | SUBCUTANEOUS | Status: DC
  Administered 2019-03-04: 08:00:00 40 mg via SUBCUTANEOUS
  Filled 2019-03-04 (×2): qty 0.4

## 2019-03-04 MED ORDER — SODIUM CHLORIDE 0.9 % IV BOLUS: 500.0000 mL | Freq: Once | INTRAVENOUS | Status: DC

## 2019-03-04 MED ORDER — CARVEDILOL 6.25 MG PO TABS
3.1250 mg | ORAL_TABLET | Freq: Two times a day (BID) | ORAL | Status: DC
  Administered 2019-03-04 – 2019-03-05 (×3): 3.125 mg via ORAL
  Filled 2019-03-04 (×3): qty 1
  Filled 2019-03-04 (×4): qty 0.5

## 2019-03-04 MED ORDER — ASPIRIN EC 81 MG PO TBEC
81.0000 mg | DELAYED_RELEASE_TABLET | Freq: Every day | ORAL | Status: DC
  Administered 2019-03-04 – 2019-03-05 (×2): 81 mg via ORAL
  Filled 2019-03-04 (×2): qty 1

## 2019-03-04 MED ORDER — VANCOMYCIN HCL IN DEXTROSE 1-5 GM/200ML-% IV SOLN: 1000.0000 mg | INTRAVENOUS | Status: DC

## 2019-03-04 MED ORDER — ATORVASTATIN CALCIUM 20 MG PO TABS
40.0000 mg | ORAL_TABLET | Freq: Every day | ORAL | Status: DC
  Administered 2019-03-04: 40 mg via ORAL
  Filled 2019-03-04: qty 2

## 2019-03-04 MED ORDER — SODIUM CHLORIDE 0.9 % IV SOLN
INTRAVENOUS | Status: DC | PRN
  Administered 2019-03-04: 05:00:00 500 mL via INTRAVENOUS

## 2019-03-04 MED ORDER — SODIUM CHLORIDE 0.9 % IV SOLN
2.0000 g | Freq: Two times a day (BID) | INTRAVENOUS | Status: DC
  Administered 2019-03-04 – 2019-03-05 (×3): 2 g via INTRAVENOUS
  Filled 2019-03-04 (×4): qty 2

## 2019-03-04 NOTE — Progress Notes (Signed)
Patient K 3.3 this am. Dr. Marcille Blanco notified.  Patient's brother called and wanted update. I was left the message and was unable to get a hold of him. His name is Yussuf Sawyers and his Phone number is [phone]

## 2019-03-04 NOTE — Progress Notes (Signed)
[facility] NAME: Carl [name]    MR#:  [id number]  DATE OF BIRTH:  1951-07-07  SUBJECTIVE:  CHIEF COMPLAINT:   Chief Complaint  Patient presents with  . Flank Pain   Came with flank pain, feels better now. REVIEW OF SYSTEMS:  CONSTITUTIONAL: No fever,have fatigue or weakness.  EYES: No blurred or double vision.  EARS, NOSE, AND THROAT: No tinnitus or ear pain.  RESPIRATORY: No cough, shortness of breath, wheezing or hemoptysis.  CARDIOVASCULAR: No chest pain, orthopnea, edema.  GASTROINTESTINAL: No nausea, vomiting, diarrhea or abdominal pain.  GENITOURINARY: No dysuria, hematuria.  ENDOCRINE: No polyuria, nocturia,  HEMATOLOGY: No anemia, easy bruising or bleeding SKIN: No rash or lesion. MUSCULOSKELETAL: No joint pain or arthritis.   NEUROLOGIC: No tingling, numbness, weakness.  PSYCHIATRY: No anxiety or depression.   ROS  DRUG ALLERGIES:   Allergies  Allergen Reactions  . Spironolactone Other (See Comments)    gynecomastia     VITALS:  Blood pressure 98/76, pulse 71, temperature 98.2 F (36.8 C), temperature source Oral, resp. rate 17, height 6' (1.829 m), weight 64 kg, SpO2 95 %.  PHYSICAL EXAMINATION:  GENERAL:  68 y.o.-year-old patient lying in the bed with no acute distress.  EYES: Pupils equal, round, reactive to light and accommodation. No scleral icterus. Extraocular muscles intact.  HEENT: Head atraumatic, normocephalic. Oropharynx and nasopharynx clear.  NECK:  Supple, no jugular venous distention. No thyroid enlargement, no tenderness.  LUNGS: Normal breath sounds bilaterally, no wheezing,some crepitation. No use of accessory muscles of respiration.  CARDIOVASCULAR: S1, S2 normal. No murmurs, rubs, or gallops.  ABDOMEN: Soft, nontender, nondistended. Bowel sounds present. No organomegaly or mass.  EXTREMITIES: No pedal edema, cyanosis, or clubbing.  NEUROLOGIC: Cranial nerves II through XII are intact.  Muscle strength 4/5 in all extremities. Sensation intact. Gait not checked.  PSYCHIATRIC: The patient is alert and oriented x 3.  SKIN: No obvious rash, lesion, or ulcer.   Physical Exam LABORATORY PANEL:   CBC Recent Labs  Lab 03/04/19 0513  WBC 8.8  HGB 8.3*  HCT 26.2*  PLT 207   ------------------------------------------------------------------------------------------------------------------  Chemistries  Recent Labs  Lab 03/03/19 2010 03/04/19 0513  NA 131* 135  K 3.5 3.3*  CL 94* 101  CO2 26 27  GLUCOSE 120* 151*  BUN 24* 24*  CREATININE 1.37* 1.39*  CALCIUM 8.8* 8.3*  AST 26  --   ALT 15  --   ALKPHOS 69  --   BILITOT 1.6*  --    ------------------------------------------------------------------------------------------------------------------  Cardiac Enzymes No results for input(s): TROPONINI in the last 168 hours. ------------------------------------------------------------------------------------------------------------------  RADIOLOGY:  Dg Chest 2 View  Result Date: 03/03/2019 CLINICAL DATA:  Epigastric and back pain. EXAM: CHEST - 2 VIEW COMPARISON:  12/27/2018 FINDINGS: AP and lateral views were obtained. The cardio pericardial silhouette is enlarged. There is pulmonary vascular congestion without overt pulmonary edema. Interstitial markings are diffusely coarsened with chronic features. Lateral film shows patchy airspace disease in the posterior lower lobe, likely left period bones are diffusely demineralized. Nonacute right rib fractures evident. Pacer/AICD again noted. IMPRESSION: Patchy left lower lobe airspace opacity. Imaging features suggest pneumonia. Cardiomegaly with chronic interstitial changes. Electronically Signed   By: Misty Stanley M.D.   On: 03/03/2019 20:33   Ct Abdomen Pelvis W Contrast  Result Date: 03/04/2019 CLINICAL DATA:  Acute abdominal pain.  Back pain and epigastric pain EXAM: CT ABDOMEN AND PELVIS WITH CONTRAST TECHNIQUE:  Multidetector  CT imaging of the abdomen and pelvis was performed using the standard protocol following bolus administration of intravenous contrast. CONTRAST:  140mL OMNIPAQUE IOHEXOL 300 MG/ML  SOLN COMPARISON:  None. FINDINGS: Lower chest: There is airspace consolidation at the left lung base.Heart is significantly enlarged. An Amplatzer plug is noted. Hepatobiliary: The liver demonstrates cirrhotic morphology. Normal gallbladder.There is no biliary ductal dilation. Pancreas: Normal contours without ductal dilatation. No peripancreatic fluid collection. Spleen: No splenic laceration or hematoma. Adrenals/Urinary Tract: --Adrenal glands: No adrenal hemorrhage. --Right kidney/ureter: No hydronephrosis or perinephric hematoma. --Left kidney/ureter: No hydronephrosis or perinephric hematoma. --Urinary bladder: The urinary bladder is decompressed which limits evaluation. Stomach/Bowel: --Stomach/Duodenum: No hiatal hernia or other gastric abnormality. Normal duodenal course and caliber. --Small bowel: No dilatation or inflammation. --Colon: No focal abnormality. There appears to be some fat stranding in the region of the hepatic flexure, however the colon appears essentially normal in this location. --Appendix: Normal. Vascular/Lymphatic: Atherosclerotic calcification is present within the non-aneurysmal abdominal aorta, without hemodynamically significant stenosis. --No retroperitoneal lymphadenopathy. --No mesenteric lymphadenopathy. --No pelvic or inguinal lymphadenopathy. Reproductive: Unremarkable Other: No ascites or free air. The abdominal wall is normal. Musculoskeletal. 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 There is a nondisplaced fracture of the right L5 transverse process without evidence of interval healing. There are subacute bilateral rib fractures. IMPRESSION: 1. Consolidation involving the left lower lobe concerning for pneumonia. 2. No acute intra-abdominal abnormality. 3.  Cirrhosis. 4. Cardiomegaly. 5. 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 There is no evidence of significant interval healing involving the right L5 transverse process fracture. 6. Bilateral subacute healing rib fractures. Aortic Atherosclerosis (ICD10-I70.0). Electronically Signed   By: Constance Holster M.D.   On: 03/04/2019 00:02    ASSESSMENT AND PLAN:   Principal Problem:   HCAP (healthcare-associated pneumonia) Active Problems:   Hyperlipidemia   Essential hypertension   CAD (coronary artery disease)   GERD (gastroesophageal reflux disease)   CKD (chronic kidney disease), stage III (HCC)   Chronic systolic CHF (congestive heart failure) (HCC)   GAD (generalized anxiety disorder)  * HCAP (healthcare-associated pneumonia) - IV antibiotics started.  PRN duonebs and supportive treatment.   MRSA negative, cont cefepime, stopped vancomycin,  *  Essential hypertension - hold antihypertensives for now given soft BP *  CAD (coronary artery disease) - continue home meds *  Chronic systolic CHF (congestive heart failure) (HCC) - cautious fluid administration as above, continue home meds *  GAD (generalized anxiety disorder) - home dose anxiolytic *  Hyperlipidemia - home dose antilipid *  CKD (chronic kidney disease), stage III ([place]) - at baseline, avoid nephrotoxins and monitor    All the records are reviewed and case discussed with Care Management/Social Workerr. Management plans discussed with the patient, family and they are in agreement.  CODE STATUS: full.  TOTAL TIME TAKING CARE OF THIS PATIENT: 35 minutes.     POSSIBLE D/C IN 1-2 DAYS, DEPENDING ON CLINICAL CONDITION.   Vaughan Basta M.D on 03/04/2019   Between 7am to 6pm - Pager - [phone]  After 6pm go to [email or web address] - password [email or web address] [facility]  Office  [phone]  CC: Primary care physician; [name], MD  Note: This dictation was prepared  with Dragon dictation along with smaller phrase technology. Any transcriptional errors that result from this process are unintentional.

## 2019-03-04 NOTE — TOC Initial Note (Signed)
Transition of Care [facility]) - Initial/Assessment Note    Patient Details  Name: Carl [name] MRN: [id number] Date of Birth: 1951/08/11  Transition of [facility]) CM/SW Contact:    Yoel Kaufhold, Lenice Llamas Phone Number: [phone]  03/04/2019, 10:06 AM  Clinical Narrative: Clinical Social Worker (CSW) met with patient alone at bedside to discuss D/C plan. Patient was alert and oriented X3 and was sitting up in the bed. CSW introduced self and explained role of CSW department. Per patient he lives alone in [place] and his brother [name] and sister live close by. Per patient his brother and sister are his primary supports. Per patient he has an adult son in [place] and an adult daughter in [place]. Patient reported that he is very happy with [facility] home [facility] and wants to continue to use them. Per patient his plan is to D/C home from [facility]. CSW will continue to follow and assist as needed.              Expected Discharge Plan: [place] Barriers to Discharge: Continued Medical Work up   Patient Goals and CMS Choice Patient states their goals for this hospitalization and ongoing recovery are:: To feel better. CMS Medicare.gov Compare Post Acute Care list provided to:: (Patient is open to [facility] for home health and wants to continue to use them.) Choice offered to / list presented to : (Patient is open to [facility] for home health and wants to continue to use them.)  Expected Discharge Plan and Services Expected Discharge Plan: [place] In-house Referral: Clinical Social Work Discharge Planning Services: CM Consult   Living arrangements for the past 2 months: [place]: PT [place]: [facility] [employer] [facility] [employer] Date [place]: 03/04/19   Representative spoke with at [place]  Prior Living Arrangements/Services Living arrangements for the past 2 months: [place] with:: Self Patient language and need for interpreter reviewed:: No Do you feel safe going back to the place where you live?: Yes      Need for Family Participation in Patient Care: Yes (Comment) Care giver support system in place?: Yes (comment) Current home services: Home PT Criminal Activity/Legal Involvement Pertinent to Current Situation/Hospitalization: No - Comment as needed  Activities of Daily Living Home Assistive Devices/Equipment: Dentures (specify type), Eyeglasses, Hearing aid, Walker (specify type), Shower chair without back, Blood pressure cuff(uppers and lowers, bilateral hearing aids) ADL Screening (condition at time of admission) Patient's cognitive ability adequate to safely complete daily activities?: No Is the patient deaf or have difficulty hearing?: Yes Does the patient have difficulty seeing, even when wearing glasses/contacts?: No Does the patient have difficulty concentrating, remembering, or making decisions?: Yes Patient able to express need for assistance with ADLs?: Yes Does the patient have difficulty dressing or bathing?: No Independently performs ADLs?: Yes (appropriate for developmental age) Does the patient have difficulty walking or climbing stairs?: No Weakness of Legs: None Weakness of Arms/Hands: None  Permission Sought/Granted Permission sought to share information with : ([place]) Permission granted to share information with : Yes, Verbal Permission Granted              Emotional Assessment Appearance:: Appears stated age   Affect (typically observed): Pleasant, Calm Orientation: : Oriented to  Self, Oriented to Place, Oriented to  Time, Fluctuating Orientation (Suspected and/or reported Sundowners) Alcohol / Substance Use: Not Applicable Psych Involvement: No (comment)  Admission diagnosis:  Flank pain [R10.9] Pneumonia of left lower lobe due to infectious organism (HCC) [J18.1] Non-intractable vomiting  with nausea, unspecified vomiting type [R11.2] Patient Active Problem List   Diagnosis Date Noted  . HCAP (healthcare-associated pneumonia) 03/04/2019  . Hallucinations 02/10/2019  . Elevated blood sugar 01/27/2019  . S/P mitral valve repair 12/31/2018  . Bilateral pulmonary contusion 10/17/2018  . Cardiac LV ejection fraction of 20-34% 10/17/2018  . Chronic systolic CHF (congestive heart failure) ([place]) 10/17/2018  . History of ischemic cardiomyopathy 10/17/2018  . ICD (implantable cardioverter-defibrillator) in place 10/17/2018  . S/p left hip fracture 10/17/2018  . Traumatic retroperitoneal hematoma 10/17/2018  . Age-related osteoporosis with current pathological fracture with routine healing 07/28/2018  . CAD (coronary artery disease) 07/21/2018  . GERD (gastroesophageal reflux disease) 07/21/2018  . Depression 07/21/2018  . CKD (chronic kidney disease), stage III ([place]) 07/21/2018  . Iron deficiency anemia 07/21/2018  . Hypokalemia 07/21/2018  . Closed left hip fracture ([place]) 07/21/2018  . Fracture of femoral neck, left ([place]) 07/21/2018  . Lower extremity pain, bilateral 11/12/2017  . 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 Hyperlipidemia 09/01/2017  . Essential hypertension 09/01/2017  . Atherosclerotic peripheral vascular disease with intermittent claudication ([place]) 09/01/2017  . Anemia, unspecified 08/06/2017  . GAD (generalized anxiety disorder) 06/18/2017  . Prostate cancer screening 04/28/2017  . Hydrocele 04/28/2017  . Bradycardia   . Premature ventricular contraction   . Severe mitral insufficiency   . Cardiomyopathy, ischemic 04/26/2015  . Difficulty hearing 12/29/2014  . Acute on chronic systolic CHF (congestive heart failure) ([place]) 10/10/2014  . 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 Protein-calorie malnutrition, severe ([place]) 07/29/2014  . AKI (acute kidney injury) ([place]) 07/29/2014   PCP:  [name], MD Pharmacy:   [id number]  [place] [id number]: [phone]: [phone]    Social Determinants of Health (SDOH) Interventions    Readmission Risk Interventions Readmission Risk Prevention Plan 12/31/2018  Transportation Screening Complete  PCP or Specialist Appt within 3-5 Days Complete  [place] or [place]  Social Work Consult for [place]/Counseling Complete  Palliative Care Screening Not Applicable  Medication Review (Press photographer Complete  Some recent data might be hidden

## 2019-03-04 NOTE — H&P (Signed)
[place] [facility] [place] NAME: Carl [name]    MR#:  [id number]  DATE OF BIRTH:  1951-05-06  DATE OF ADMISSION:  03/03/2019  PRIMARY CARE PHYSICIAN: [name], MD   REQUESTING/REFERRING PHYSICIAN: Karma Greaser, MD  CHIEF COMPLAINT:   Chief Complaint  Patient presents with  . Flank Pain    HISTORY OF PRESENT ILLNESS:  Carl [name]  is a 68 y.o. male who presents with chief complaint as above.  Patient is a poor historian, but is oriented.  He presents the ED with a complaint of flank pain.  He denies any shortness of breath.  Evaluation here shows pneumonia on x-ray and again on CT imaging.  He does not meet sepsis criteria.  Hospitalist called for admission  PAST MEDICAL HISTORY:   Past Medical History:  Diagnosis Date  . AICD (automatic cardioverter/defibrillator) present   . Anemia   . Anxiety   . Arthritis   . Brunner's gland hyperplasia of duodenum   . CHF (congestive heart failure) ([place])   . Coronary artery disease   . External hemorrhoids   . Fracture of femoral neck, left ([place]) 07/21/2018  . GERD (gastroesophageal reflux disease)   . Hearing loss   . HH (hiatus hernia)   . Internal hemorrhoids   . Ischemic cardiomyopathy   . Leg fracture, left   . Myocardial infarction ([place])   . Paronychia   . Pneumonia   . Psoriasis   . PUD (peptic ulcer disease)   . Schatzki's ring   . Tubular adenoma of colon   . Vitiligo      PAST SURGICAL HISTORY:   Past Surgical History:  Procedure Laterality Date  . ATRIAL SEPTAL DEFECT(ASD) CLOSURE N/A 12/30/2018   Procedure: ATRIAL SEPTAL DEFECT(ASD) CLOSURE;  Surgeon: [name], MD;  Location: [place] [facility];  Service: Cardiovascular;  Laterality: N/A;  . CARDIAC SURGERY    . COLONOSCOPY WITH ESOPHAGOGASTRODUODENOSCOPY (EGD)    . COLONOSCOPY WITH PROPOFOL N/A 08/24/2017   Procedure: COLONOSCOPY WITH PROPOFOL;  Surgeon: [name], MD;  Location: [facility] ENDOSCOPY;   Service: Endoscopy;  Laterality: N/A;  . CORONARY ARTERY BYPASS GRAFT N/A 08/03/2014   Procedure: CORONARY ARTERY BYPASS GRAFTING (CABG);  Surgeon: [name], MD;  Location: [place];  Service: Open Heart Surgery;  Laterality: N/A;  . EP IMPLANTABLE DEVICE N/A 05/01/2015   MDT ICD implanted for primary prevention of sudden death  . ESOPHAGOGASTRODUODENOSCOPY (EGD) WITH PROPOFOL N/A 04/16/2015   Procedure: ESOPHAGOGASTRODUODENOSCOPY (EGD) WITH PROPOFOL;  Surgeon: [name], MD;  Location: [facility] ENDOSCOPY;  Service: Endoscopy;  Laterality: N/A;  . ESOPHAGOGASTRODUODENOSCOPY (EGD) WITH PROPOFOL N/A 08/24/2017   Procedure: ESOPHAGOGASTRODUODENOSCOPY (EGD) WITH PROPOFOL;  Surgeon: [name], MD;  Location: [facility] ENDOSCOPY;  Service: Endoscopy;  Laterality: N/A;  . FRACTURE SURGERY    . HERNIA REPAIR    . HIP PINNING,CANNULATED Left 07/22/2018   Procedure: CANNULATED HIP PINNING;  Surgeon: [name], MD;  Location: [place];  Service: Orthopedics;  Laterality: Left;  . MITRAL VALVE REPAIR N/A 12/30/2018   Procedure: MITRAL VALVE REPAIR;  Surgeon: [name], MD;  Location: [place] [facility];  Service: Cardiovascular;  Laterality: N/A;  . RIGHT/LEFT HEART CATH AND CORONARY/GRAFT ANGIOGRAPHY N/A 12/21/2018   Procedure: RIGHT/LEFT HEART CATH AND CORONARY/GRAFT ANGIOGRAPHY;  Surgeon: [name], MD;  Location: [place] [facility];  Service: Cardiovascular;  Laterality: N/A;  . TEE WITHOUT CARDIOVERSION N/A 08/03/2014   Procedure: TRANSESOPHAGEAL ECHOCARDIOGRAM (  TEE);  Surgeon: [name], MD;  Location: [place];  Service: Open Heart Surgery;  Laterality: N/A;  . TEE WITHOUT CARDIOVERSION N/A 02/10/2018   Procedure: TRANSESOPHAGEAL ECHOCARDIOGRAM (TEE);  Surgeon: [name], MD;  Location: [facility] ENDOSCOPY;  Service: Cardiovascular;  Laterality: N/A;     SOCIAL HISTORY:   Social History   Tobacco Use  . Smoking status: Former Smoker    Quit date: 2014    Years since  quitting: 6.5  . Smokeless tobacco: Never Used  . Tobacco comment: 07/30/2017 Quit in 2014  Substance Use Topics  . Alcohol use: No    Alcohol/week: 0.0 standard drinks     FAMILY HISTORY:   Family History  Problem Relation Age of Onset  . Valvular heart disease Mother        Ruptured valve  . CAD Father   . Heart Problems Brother        Stents x 4  . Diabetes Brother   . Prostate cancer Neg Hx   . Bladder Cancer Neg Hx   . Kidney cancer Neg Hx      DRUG ALLERGIES:   Allergies  Allergen Reactions  . Spironolactone Other (See Comments)    gynecomastia     MEDICATIONS AT HOME:   Prior to Admission medications   Medication Sig Start Date End Date Taking? Authorizing Provider  acetaminophen (TYLENOL) 325 MG tablet Take 325 mg by mouth every 6 (six) hours as needed for mild pain or moderate pain.     [provider]  albuterol (PROVENTIL) (2.5 MG/3ML) 0.083% nebulizer solution Take 3 mLs by nebulization every 6 (six) hours as needed for wheezing or shortness of breath.  11/24/18   [provider]  allopurinol (ZYLOPRIM) 100 MG tablet Take 100 mg by mouth daily. 02/08/19   [provider]  Ascorbic Acid (VITAMIN C) 100 MG tablet Take 100 mg by mouth daily.    [provider]  aspirin EC 81 MG tablet Take 1 tablet (81 mg total) by mouth daily. 08/26/18   [name], MD  atorvastatin (LIPITOR) 40 MG tablet Take 1 tablet (40 mg total) by mouth daily at 6 PM. 01/10/19   [name], MD  carvedilol (COREG) 3.125 MG tablet Take 3.125 mg by mouth 2 (two) times daily with a meal.    [provider]  Cholecalciferol (VITAMIN D3 SUPER STRENGTH) 50 MCG (2000 UT) TABS Take 2,000 Units by mouth daily.     [provider]  citalopram (CELEXA) 20 MG tablet Take 20 mg by mouth daily.  07/09/16   [provider]  clopidogrel (PLAVIX) 75 MG tablet Take 1 tablet (75 mg total) by mouth daily with breakfast. 01/01/19   Eileen Stanford, PA-C  cyanocobalamin 500 MCG tablet Take 500 mcg by mouth daily.    [provider]  docusate sodium (COLACE) 100 MG capsule Take 100 mg by mouth daily.    [provider]  DR SMITHS DIAPER 10 % OINT Apply 1 application topically daily as needed (IRRITATION).  09/01/18   [provider]  eplerenone (INSPRA) 50 MG tablet Take 1 tablet (50 mg total) by mouth daily. 01/12/19   [name], MD  ferrous sulfate 325 (65 FE) MG tablet Take 1 tablet (325 mg total) by mouth daily with breakfast. 02/09/18   [name], MD  ketoconazole (NIZORAL) 2 % shampoo Apply 1 application topically 2 (two) times a week.  09/28/18   [provider]  Multiple Vitamin (MULTIVITAMIN) tablet Take 1 tablet by mouth daily.    [provider]  pantoprazole (PROTONIX) 40 MG tablet Take 1 tablet (40 mg total) by mouth daily. 01/01/19   Eileen Stanford, PA-C  potassium chloride SA (K-DUR) 20 MEQ tablet Take 1 tablet (20 mEq total) by mouth 2 (two) times daily. 01/18/19   Clegg, Amy D, NP  torsemide (DEMADEX) 20 MG tablet Take 80mg  (4 tablets) in the AM and 40mg  (2 tablets) in the PM. Patient taking differently: Take 40-80 mg by mouth 2 (two) times daily. Take 80mg  (4 tablets) in the AM and 40mg  (2 tablets) in the PM. 01/18/19   [name], MD    REVIEW OF SYSTEMS:  Review of Systems  Constitutional: Positive for malaise/fatigue. Negative for chills, fever and weight loss.  HENT: Negative for ear pain, hearing loss and tinnitus.   Eyes: Negative for blurred vision, double vision, pain and redness.  Respiratory: Negative for cough, hemoptysis and shortness of breath.   Cardiovascular: Negative for chest pain, palpitations, orthopnea and leg swelling.  Gastrointestinal: Negative for abdominal pain, constipation, diarrhea, nausea and vomiting.  Genitourinary: Positive for flank pain. Negative for dysuria, frequency and hematuria.  Musculoskeletal: Negative for  back pain, joint pain and neck pain.  Skin:       No acne, rash, or lesions  Neurological: Negative for dizziness, tremors, focal weakness and weakness.  Endo/Heme/Allergies: Negative for polydipsia. Does not bruise/bleed easily.  Psychiatric/Behavioral: Negative for depression. The patient is not nervous/anxious and does not have insomnia.      VITAL SIGNS:   Vitals:   03/03/19 2000 03/03/19 2200 03/03/19 2230 03/03/19 2240  BP: 101/68 92/62 97/65  97/65  Pulse: 76 75 80 80  Resp: (!) 22 19 20 20   Temp:      TempSrc:      SpO2: 96% 94% 93% 94%  Weight:      Height:       Wt Readings from Last 3 Encounters:  03/03/19 64 kg  02/28/19 66.7 kg  02/22/19 67.6 kg    PHYSICAL EXAMINATION:  Physical Exam  Vitals reviewed. Constitutional: He is oriented to person, place, and time. He appears well-developed and well-nourished. No distress.  HENT:  Head: Normocephalic and atraumatic.  Mouth/Throat: Oropharynx is clear and moist.  Eyes: Pupils are equal, round, and reactive to light. Conjunctivae and EOM are normal. No scleral icterus.  Neck: Normal range of motion. Neck supple. No JVD present. No thyromegaly present.  Cardiovascular: Normal rate, regular rhythm and intact distal pulses. Exam reveals no gallop and no friction rub.  No murmur heard. Respiratory: Effort normal. No respiratory distress. He has no wheezes. He has no rales.  Basilar ronchi  GI: Soft. Bowel sounds are normal. He exhibits no distension. There is no abdominal tenderness.  Musculoskeletal: Normal range of motion.        General: No edema.     Comments: No arthritis, no gout  Lymphadenopathy:    He has no cervical adenopathy.  Neurological: He is alert and oriented to person, place, and time. No cranial nerve deficit.  No dysarthria, no aphasia  Skin: Skin is warm and dry. No rash noted. No erythema.  Psychiatric: He has a normal mood and affect. His behavior is normal. Judgment and thought content normal.     LABORATORY PANEL:   CBC Recent Labs  Lab 03/03/19 2010  WBC 12.4*  HGB 9.9*  HCT 30.6*  PLT 233   ------------------------------------------------------------------------------------------------------------------  Chemistries  Recent Labs  Lab 03/03/19 2010  NA 131*  K 3.5  CL 94*  CO2 26  GLUCOSE 120*  BUN 24*  CREATININE 1.37*  CALCIUM 8.8*  AST 26  ALT 15  ALKPHOS 69  BILITOT 1.6*   ------------------------------------------------------------------------------------------------------------------  Cardiac Enzymes No results for input(s): TROPONINI in the last 168 hours. ------------------------------------------------------------------------------------------------------------------  RADIOLOGY:  Dg Chest 2 View  Result Date: 03/03/2019 CLINICAL DATA:  Epigastric and back pain. EXAM: CHEST - 2 VIEW COMPARISON:  12/27/2018 FINDINGS: AP and lateral views were obtained. The cardio pericardial silhouette is enlarged. There is pulmonary vascular congestion without overt pulmonary edema. Interstitial markings are diffusely coarsened with chronic features. Lateral film shows patchy airspace disease in the posterior lower lobe, likely left period bones are diffusely demineralized. Nonacute right rib fractures evident. Pacer/AICD again noted. IMPRESSION: Patchy left lower lobe airspace opacity. Imaging features suggest pneumonia. Cardiomegaly with chronic interstitial changes. Electronically Signed   By: Misty Stanley M.D.   On: 03/03/2019 20:33   Ct Abdomen Pelvis W Contrast  Result Date: 03/04/2019 CLINICAL DATA:  Acute abdominal pain.  Back pain and epigastric pain EXAM: CT ABDOMEN AND PELVIS WITH CONTRAST TECHNIQUE: Multidetector CT imaging of the abdomen and pelvis was performed using the standard protocol following bolus administration of intravenous contrast. CONTRAST:  186mL OMNIPAQUE IOHEXOL 300 MG/ML  SOLN COMPARISON:  None. FINDINGS: Lower chest: There is airspace  consolidation at the left lung base.Heart is significantly enlarged. An Amplatzer plug is noted. Hepatobiliary: The liver demonstrates cirrhotic morphology. Normal gallbladder.There is no biliary ductal dilation. Pancreas: Normal contours without ductal dilatation. No peripancreatic fluid collection. Spleen: No splenic laceration or hematoma. Adrenals/Urinary Tract: --Adrenal glands: No adrenal hemorrhage. --Right kidney/ureter: No hydronephrosis or perinephric hematoma. --Left kidney/ureter: No hydronephrosis or perinephric hematoma. --Urinary bladder: The urinary bladder is decompressed which limits evaluation. Stomach/Bowel: --Stomach/Duodenum: No hiatal hernia or other gastric abnormality. Normal duodenal course and caliber. --Small bowel: No dilatation or inflammation. --Colon: No focal abnormality. There appears to be some fat stranding in the region of the hepatic flexure, however the colon appears essentially normal in this location. --Appendix: Normal. Vascular/Lymphatic: Atherosclerotic calcification is present within the non-aneurysmal abdominal aorta, without hemodynamically significant stenosis. --No retroperitoneal lymphadenopathy. --No mesenteric lymphadenopathy. --No pelvic or inguinal lymphadenopathy. Reproductive: Unremarkable Other: No ascites or free air. The abdominal wall is normal. Musculoskeletal. 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 There is a nondisplaced fracture of the right L5 transverse process without evidence of interval healing. There are subacute bilateral rib fractures. IMPRESSION: 1. Consolidation involving the left lower lobe concerning for pneumonia. 2. No acute intra-abdominal abnormality. 3. Cirrhosis. 4. Cardiomegaly. 5. 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 There is no evidence of significant interval healing involving the right L5 transverse process fracture. 6. Bilateral subacute healing rib fractures. Aortic  Atherosclerosis (ICD10-I70.0). Electronically Signed   By: Constance Holster M.D.   On: 03/04/2019 00:02    EKG:   Orders placed or performed during the hospital encounter of 03/03/19  . ED EKG  . ED EKG    IMPRESSION AND PLAN:  Principal Problem:   HCAP (healthcare-associated pneumonia) - IV antibiotics started.  PRN duonebs and supportive treatment.  BP is soft, but lactic acid is within normal limits.  Will give cautious IV fluids for BP support Active Problems:   Essential hypertension - hold antihypertensives for now given soft BP   CAD (coronary artery disease) - continue home meds  Chronic systolic CHF (congestive heart failure) (HCC) - cautious fluid administration as above, continue home meds   GAD (generalized anxiety disorder) - home dose anxiolytic   Hyperlipidemia - home dose antilipid   CKD (chronic kidney disease), stage III ([place]) - at baseline, avoid nephrotoxins and monitor  Chart review performed and case discussed with ED provider. Labs, imaging and/or ECG reviewed by provider and discussed with patient/family. Management plans discussed with the patient and/or family.  COVID-19 status: Tested negative     DVT PROPHYLAXIS: SubQ lovenox   GI PROPHYLAXIS:  None  ADMISSION STATUS: Inpatient     CODE STATUS: Full Code Status History    Date Active Date Inactive Code Status Order ID Comments User Context   02/10/2019 0517 02/10/2019 2006 Full Code [id number]  Beverely Pace ED   12/30/2018 1257 12/31/2018 1732 Full Code [id number]  [name], MD Inpatient   12/21/2018 0912 12/21/2018 1540 Full Code [id number]  [name], MD Inpatient   07/21/2018 1944 07/27/2018 1554 Full Code [id number]  Ivor Costa, MD ED   06/11/2017 0538 06/12/2017 1547 Full Code [id number]  Harrie Foreman, MD Inpatient   06/10/2016 1916 06/12/2016 2340 Full Code [id number]  Maryellen Pile, MD Inpatient   05/01/2015 1514 05/02/2015 1548 Full Code [id number]  Thompson Grayer, MD Inpatient    08/03/2014 1332 08/17/2014 1636 Full Code [id number]  John Giovanni, PA-C Inpatient   07/27/2014 1646 08/03/2014 1332 Full Code [id number]  Erlene Quan, PA-C Inpatient   Advance Care Planning Activity      TOTAL TIME TAKING CARE OF THIS PATIENT: 45 minutes.   This patient was evaluated in the context of the global COVID-19 pandemic, which necessitated consideration that the patient might be at risk for infection with the SARS-CoV-2 virus that causes COVID-19. Institutional protocols and algorithms that pertain to the evaluation of patients at risk for COVID-19 are in a state of rapid change based on information released by regulatory bodies including the CDC and federal and state organizations. These policies and algorithms were followed to the best of this provider's knowledge to date during the patient's care at this facility.  Ethlyn Daniels 03/04/2019, 1:00 AM  [facility]  Office  [phone]  CC: Primary care physician; [name], MD  Note:  This document was prepared using Dragon voice recognition software and may include unintentional dictation errors.

## 2019-03-04 NOTE — Consult Note (Signed)
Pharmacy Antibiotic Note  Carl [name] is a 68 y.o. male admitted on 03/03/2019 with pneumonia.  Pharmacy has been consulted for Vancomycin and cefepime dosing. Patient received ceftriaxone and azithromycin x 1 dose in ED.   Plan: Vancomycin 1500mg  IV x 1 loading dose. Followed by Vancomycin 1000 mg IV Q 24 hrs. Goal AUC 400-550. Expected AUC: 496 SCr used: 1.37  MRSA PCR ordered. If negative, recommend DC of vancomycin.  Start cefepime 2g IV every 12 hours    Height: 6' (182.9 cm) Weight: 141 lb (64 kg) IBW/kg (Calculated) : 77.6  Temp (24hrs), Avg:98.8 F (37.1 C), Min:98.6 F (37 C), Max:99 F (37.2 C)  Recent Labs  Lab 03/03/19 2010 03/03/19 2142  WBC 12.4*  --   CREATININE 1.37*  --   LATICACIDVEN  --  0.9    Estimated Creatinine Clearance: 47.4 mL/min (A) (by C-G formula based on SCr of 1.37 mg/dL (H)).    Allergies  Allergen Reactions  . Spironolactone Other (See Comments)    gynecomastia     Antimicrobials this admission: 7/9 azithro/CTX >> x1 dose  7/10 cefepime >>  7/10 Vancomycin>>   Microbiology results: 7/9  BCx: pending 7/9 SARS Coronavirus 2: negative  7/10 MRSA PCR: pending  Thank you for allowing pharmacy to be a part of this patient's care.  Pernell Dupre, PharmD, BCPS Clinical Pharmacist 03/04/2019 1:42 AM

## 2019-03-04 NOTE — Plan of Care (Signed)
  Problem: Education: Goal: Knowledge of  General Education information/materials will improve Outcome: Progressing Goal: Emotional status will improve Outcome: Progressing Goal: Mental status will improve Outcome: Progressing Goal: Verbalization of understanding the information provided will improve Outcome: Progressing   Problem: Activity: Goal: Interest or engagement in activities will improve Outcome: Progressing Goal: Sleeping patterns will improve Outcome: Progressing   Problem: Coping: Goal: Ability to verbalize frustrations and anger appropriately will improve Outcome: Progressing Goal: Ability to demonstrate self-control will improve Outcome: Progressing   Problem: Health Behavior/Discharge Planning: Goal: Identification of resources available to assist in meeting health care needs will improve Outcome: Progressing Goal: Compliance with treatment plan for underlying cause of condition will improve Outcome: Progressing   Problem: Physical Regulation: Goal: Ability to maintain clinical measurements within normal limits will improve Outcome: Progressing   Problem: Safety: Goal: Periods of time without injury will increase Outcome: Progressing   

## 2019-03-04 NOTE — ED Notes (Signed)
.. ED TO INPATIENT HANDOFF REPORT  ED Nurse Name and Phone #: Deneise Lever [phone]  J Name/Age/Gender Carl [name] 68 y.o. male Room/Bed: ED12A/ED12A  Code Status   Code Status: Prior  Home/SNF/Other Home Patient oriented to: self Is this baseline? Yes   Triage Complete: Triage complete  Chief Complaint abd pain  Triage Note Pt reports that PCP sent him to the ED for c/o pain in neck/flanks - reports hx of pinched nerve - pt PCP stated to pt that he was in so much pain that he needed to be managed in the ED - pt has OTC pain patches in multiple spots on abd and flank   Allergies Allergies  Allergen Reactions  . Spironolactone Other (See Comments)    gynecomastia     Level of Care/Admitting Diagnosis ED Disposition    ED Disposition Condition [facility] Area: [place] [[id number]]  Level of Care: Med-Surg [16]  Covid Evaluation: Confirmed COVID Negative  Diagnosis: HCAP (healthcare-associated pneumonia) [[id number]]  Admitting Physician: Lance Coon [[id number]]  Attending Physician: Lance Coon [phone]  Estimated length of stay: past midnight tomorrow  Certification:: I certify this patient will need inpatient services for at least 2 midnights  PT Class (Do Not Modify): Inpatient [101]  PT Acc Code (Do Not Modify): Private [1]       B Medical/Surgery History Past Medical History:  Diagnosis Date  . AICD (automatic cardioverter/defibrillator) present   . Anemia   . Anxiety   . Arthritis   . Brunner's gland hyperplasia of duodenum   . CHF (congestive heart failure) ([place])   . Coronary artery disease   . External hemorrhoids   . Fracture of femoral neck, left ([place]) 07/21/2018  . GERD (gastroesophageal reflux disease)   . Hearing loss   . HH (hiatus hernia)   . Internal hemorrhoids   . Ischemic cardiomyopathy   . Leg fracture, left   . Myocardial infarction ([place])   . Paronychia   . Pneumonia   . Psoriasis   . PUD (peptic  ulcer disease)   . Schatzki's ring   . Tubular adenoma of colon   . Vitiligo    Past Surgical History:  Procedure Laterality Date  . ATRIAL SEPTAL DEFECT(ASD) CLOSURE N/A 12/30/2018   Procedure: ATRIAL SEPTAL DEFECT(ASD) CLOSURE;  Surgeon: [name], MD;  Location: [place] [facility];  Service: Cardiovascular;  Laterality: N/A;  . CARDIAC SURGERY    . COLONOSCOPY WITH ESOPHAGOGASTRODUODENOSCOPY (EGD)    . COLONOSCOPY WITH PROPOFOL N/A 08/24/2017   Procedure: COLONOSCOPY WITH PROPOFOL;  Surgeon: [name], MD;  Location: [facility] ENDOSCOPY;  Service: Endoscopy;  Laterality: N/A;  . CORONARY ARTERY BYPASS GRAFT N/A 08/03/2014   Procedure: CORONARY ARTERY BYPASS GRAFTING (CABG);  Surgeon: [name], MD;  Location: [place];  Service: Open Heart Surgery;  Laterality: N/A;  . EP IMPLANTABLE DEVICE N/A 05/01/2015   MDT ICD implanted for primary prevention of sudden death  . ESOPHAGOGASTRODUODENOSCOPY (EGD) WITH PROPOFOL N/A 04/16/2015   Procedure: ESOPHAGOGASTRODUODENOSCOPY (EGD) WITH PROPOFOL;  Surgeon: [name], MD;  Location: [facility] ENDOSCOPY;  Service: Endoscopy;  Laterality: N/A;  . ESOPHAGOGASTRODUODENOSCOPY (EGD) WITH PROPOFOL N/A 08/24/2017   Procedure: ESOPHAGOGASTRODUODENOSCOPY (EGD) WITH PROPOFOL;  Surgeon: [name], MD;  Location: [facility] ENDOSCOPY;  Service: Endoscopy;  Laterality: N/A;  . FRACTURE SURGERY    . HERNIA REPAIR    . HIP PINNING,CANNULATED Left 07/22/2018   Procedure: CANNULATED HIP PINNING;  Surgeon: Mardelle Matte,  Vonna Kotyk, MD;  Location: [place];  Service: Orthopedics;  Laterality: Left;  . MITRAL VALVE REPAIR N/A 12/30/2018   Procedure: MITRAL VALVE REPAIR;  Surgeon: [name], MD;  Location: [place] [facility];  Service: Cardiovascular;  Laterality: N/A;  . RIGHT/LEFT HEART CATH AND CORONARY/GRAFT ANGIOGRAPHY N/A 12/21/2018   Procedure: RIGHT/LEFT HEART CATH AND CORONARY/GRAFT ANGIOGRAPHY;  Surgeon: [name], MD;  Location: [place] [facility];   Service: Cardiovascular;  Laterality: N/A;  . TEE WITHOUT CARDIOVERSION N/A 08/03/2014   Procedure: TRANSESOPHAGEAL ECHOCARDIOGRAM (TEE);  Surgeon: [name], MD;  Location: [place];  Service: Open Heart Surgery;  Laterality: N/A;  . TEE WITHOUT CARDIOVERSION N/A 02/10/2018   Procedure: TRANSESOPHAGEAL ECHOCARDIOGRAM (TEE);  Surgeon: [name], MD;  Location: [facility] ENDOSCOPY;  Service: Cardiovascular;  Laterality: N/A;     A IV Location/Drains/Wounds Patient Lines/Drains/Airways Status   Active Line/Drains/Airways    Name:   Placement date:   Placement time:   Site:   Days:   Peripheral IV 03/03/19 Left Forearm   03/03/19    2010    Forearm   1   Peripheral IV 03/03/19 Right Forearm   03/03/19    2229    Forearm   1   Incision (Closed) 07/22/18 Hip   07/22/18    1023     225          Intake/Output Last 24 hours  Intake/Output Summary (Last 24 hours) at 03/04/2019 0101 Last data filed at 03/04/2019 0057 Gross per 24 hour  Intake 350 ml  Output -  Net 350 ml    Labs/Imaging Results for orders placed or performed during the hospital encounter of 03/03/19 (from the past 48 hour(s))  Urinalysis, Complete w Microscopic     Status: Abnormal   Collection Time: 03/03/19  4:00 PM  Result Value Ref Range   Color, Urine YELLOW (A) YELLOW   APPearance HAZY (A) CLEAR   Specific Gravity, Urine 1.010 1.005 - 1.030   pH 6.0 5.0 - 8.0   Glucose, UA NEGATIVE NEGATIVE mg/dL   Hgb urine dipstick NEGATIVE NEGATIVE   Bilirubin Urine NEGATIVE NEGATIVE   Ketones, ur NEGATIVE NEGATIVE mg/dL   Protein, ur NEGATIVE NEGATIVE mg/dL   Nitrite NEGATIVE NEGATIVE   Leukocytes,Ua NEGATIVE NEGATIVE   RBC / HPF 0-5 0 - 5 RBC/hpf   WBC, UA 0-5 0 - 5 WBC/hpf   Bacteria, UA NONE SEEN NONE SEEN   Squamous Epithelial / LPF 0-5 0 - 5   Mucus PRESENT     Comment: Performed at [facility], [address] [place], [place] [address]  CBC with Differential/Platelet     Status: Abnormal    Collection Time: 03/03/19  8:10 PM  Result Value Ref Range   WBC 12.4 (H) 4.0 - 10.5 K/uL   RBC 3.01 (L) 4.22 - 5.81 MIL/uL   Hemoglobin 9.9 (L) 13.0 - 17.0 g/dL   HCT 30.6 (L) 39.0 - 52.0 %   MCV 101.7 (H) 80.0 - 100.0 fL   MCH 32.9 26.0 - 34.0 pg   MCHC 32.4 30.0 - 36.0 g/dL   RDW 16.1 (H) 11.5 - 15.5 %   Platelets 233 150 - 400 K/uL   nRBC 0.0 0.0 - 0.2 %   Neutrophils Relative % 86 %   Neutro Abs 10.6 (H) 1.7 - 7.7 K/uL   Lymphocytes Relative 6 %   Lymphs Abs 0.7 0.7 - 4.0 K/uL   Monocytes Relative 7 %  Monocytes Absolute 0.9 0.1 - 1.0 K/uL   Eosinophils Relative 0 %   Eosinophils Absolute 0.0 0.0 - 0.5 K/uL   Basophils Relative 0 %   Basophils Absolute 0.0 0.0 - 0.1 K/uL   Immature Granulocytes 1 %   Abs Immature Granulocytes 0.07 0.00 - 0.07 K/uL    Comment: Performed at [facility], [place], [place] [address]  Troponin I (High Sensitivity)     Status: Abnormal   Collection Time: 03/03/19  8:10 PM  Result Value Ref Range   Troponin I (High Sensitivity) 41 (H) <18 ng/L    Comment: (NOTE) Elevated high sensitivity troponin I (hsTnI) values and significant  changes across serial measurements may suggest ACS but many other  chronic and acute conditions are known to elevate hsTnI results.  Refer to the "Links" section for chest pain algorithms and additional  guidance. Performed at [facility], [address] [place], [place] [address]   Lipase, blood     Status: None   Collection Time: 03/03/19  8:10 PM  Result Value Ref Range   Lipase 36 11 - 51 U/L    Comment: Performed at [facility], [address] [place],  [address]  Comprehensive metabolic panel     Status: Abnormal   Collection Time: 03/03/19  8:10 PM  Result Value Ref Range   Sodium 131 (L) 135 - 145 mmol/L   Potassium 3.5 3.5 - 5.1 mmol/L   Chloride 94 (L) 98 - 111 mmol/L   CO2 26 22 - 32 mmol/L   Glucose, Bld 120 (H) 70 - 99 mg/dL   BUN 24 (H) 8  - 23 mg/dL   Creatinine, Ser 1.37 (H) 0.61 - 1.24 mg/dL   Calcium 8.8 (L) 8.9 - 10.3 mg/dL   Total Protein 7.0 6.5 - 8.1 g/dL   Albumin 3.5 3.5 - 5.0 g/dL   AST 26 15 - 41 U/L   ALT 15 0 - 44 U/L   Alkaline Phosphatase 69 38 - 126 U/L   Total Bilirubin 1.6 (H) 0.3 - 1.2 mg/dL   GFR calc non Af Amer 53 (L) >60 mL/min   GFR calc Af Amer >60 >60 mL/min   Anion gap 11 5 - 15    Comment: Performed at [facility], [address], [place],  [address]  SARS Coronavirus 2 (CEPHEID- Performed in [place] hospital lab), Hosp Order     Status: None   Collection Time: 03/03/19  9:10 PM   Specimen: Nasopharyngeal Swab  Result Value Ref Range   SARS Coronavirus 2 NEGATIVE NEGATIVE    Comment: (NOTE) If result is NEGATIVE SARS-CoV-2 target nucleic acids are NOT DETECTED. The SARS-CoV-2 RNA is generally detectable in upper and lower  respiratory specimens during the acute phase of infection. The lowest  concentration of SARS-CoV-2 viral copies this assay can detect is 250  copies / mL. A negative result does not preclude SARS-CoV-2 infection  and should not be used as the sole basis for treatment or other  patient management decisions.  A negative result may occur with  improper specimen collection / handling, submission of specimen other  than nasopharyngeal swab, presence of viral mutation(s) within the  areas targeted by this assay, and inadequate number of viral copies  (<250 copies / mL). A negative result must be combined with clinical  observations, patient history, and epidemiological information. If result is POSITIVE SARS-CoV-2 target nucleic acids are DETECTED. The SARS-CoV-2 RNA is  generally detectable in upper and lower  respiratory specimens dur ing the acute phase of infection.  Positive  results are indicative of active infection with SARS-CoV-2.  Clinical  correlation with patient history and other diagnostic information is  necessary to determine patient  infection status.  Positive results do  not rule out bacterial infection or co-infection with other viruses. If result is PRESUMPTIVE POSTIVE SARS-CoV-2 nucleic acids MAY BE PRESENT.   A presumptive positive result was obtained on the submitted specimen  and confirmed on repeat testing.  While 2019 novel coronavirus  (SARS-CoV-2) nucleic acids may be present in the submitted sample  additional confirmatory testing may be necessary for epidemiological  and / or clinical management purposes  to differentiate between  SARS-CoV-2 and other Sarbecovirus currently known to infect humans.  If clinically indicated additional testing with an alternate test  methodology [phone]) is advised. The SARS-CoV-2 RNA is generally  detectable in upper and lower respiratory sp ecimens during the acute  phase of infection. The expected result is Negative. Fact Sheet for Patients:  StrictlyIdeas.no Fact Sheet for Healthcare Providers: BankingDealers.co.za This test is not yet approved or cleared by the [place] FDA and has been authorized for detection and/or diagnosis of SARS-CoV-2 by FDA under an Emergency Use Authorization (EUA).  This EUA will remain in effect (meaning this test can be used) for the duration of the COVID-19 declaration under Section 564(b)(1) of the Act, 21 U.S.C. section 360bbb-3(b)(1), unless the authorization is terminated or revoked sooner. Performed at [facility], [address] [place], [place] [address]   Lactic acid, plasma     Status: None   Collection Time: 03/03/19  9:42 PM  Result Value Ref Range   Lactic Acid, Venous 0.9 0.5 - 1.9 mmol/L    Comment: Performed at [facility], [place], [place] [address]  Troponin I (High Sensitivity)     Status: Abnormal   Collection Time: 03/03/19  9:57 PM  Result Value Ref Range   Troponin I (High Sensitivity) 40 (H) <18 ng/L    Comment:  (NOTE) Elevated high sensitivity troponin I (hsTnI) values and significant  changes across serial measurements may suggest ACS but many other  chronic and acute conditions are known to elevate hsTnI results.  Refer to the "Links" section for chest pain algorithms and additional  guidance. Performed at [facility], [address], [place], [place] [address]    Dg Chest 2 View  Result Date: 03/03/2019 CLINICAL DATA:  Epigastric and back pain. EXAM: CHEST - 2 VIEW COMPARISON:  12/27/2018 FINDINGS: AP and lateral views were obtained. The cardio pericardial silhouette is enlarged. There is pulmonary vascular congestion without overt pulmonary edema. Interstitial markings are diffusely coarsened with chronic features. Lateral film shows patchy airspace disease in the posterior lower lobe, likely left period bones are diffusely demineralized. Nonacute right rib fractures evident. Pacer/AICD again noted. IMPRESSION: Patchy left lower lobe airspace opacity. Imaging features suggest pneumonia. Cardiomegaly with chronic interstitial changes. Electronically Signed   By: Misty Stanley M.D.   On: 03/03/2019 20:33   Ct Abdomen Pelvis W Contrast  Result Date: 03/04/2019 CLINICAL DATA:  Acute abdominal pain.  Back pain and epigastric pain EXAM: CT ABDOMEN AND PELVIS WITH CONTRAST TECHNIQUE: Multidetector CT imaging of the abdomen and pelvis was performed using the standard protocol following bolus administration of intravenous contrast. CONTRAST:  15mL OMNIPAQUE IOHEXOL 300 MG/ML  SOLN COMPARISON:  None. FINDINGS: Lower chest: There is airspace consolidation  at the left lung base.Heart is significantly enlarged. An Amplatzer plug is noted. Hepatobiliary: The liver demonstrates cirrhotic morphology. Normal gallbladder.There is no biliary ductal dilation. Pancreas: Normal contours without ductal dilatation. No peripancreatic fluid collection. Spleen: No splenic laceration or hematoma. Adrenals/Urinary Tract:  --Adrenal glands: No adrenal hemorrhage. --Right kidney/ureter: No hydronephrosis or perinephric hematoma. --Left kidney/ureter: No hydronephrosis or perinephric hematoma. --Urinary bladder: The urinary bladder is decompressed which limits evaluation. Stomach/Bowel: --Stomach/Duodenum: No hiatal hernia or other gastric abnormality. Normal duodenal course and caliber. --Small bowel: No dilatation or inflammation. --Colon: No focal abnormality. There appears to be some fat stranding in the region of the hepatic flexure, however the colon appears essentially normal in this location. --Appendix: Normal. Vascular/Lymphatic: Atherosclerotic calcification is present within the non-aneurysmal abdominal aorta, without hemodynamically significant stenosis. --No retroperitoneal lymphadenopathy. --No mesenteric lymphadenopathy. --No pelvic or inguinal lymphadenopathy. Reproductive: Unremarkable Other: No ascites or free air. The abdominal wall is normal. Musculoskeletal. 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 There is a nondisplaced fracture of the right L5 transverse process without evidence of interval healing. There are subacute bilateral rib fractures. IMPRESSION: 1. Consolidation involving the left lower lobe concerning for pneumonia. 2. No acute intra-abdominal abnormality. 3. Cirrhosis. 4. Cardiomegaly. 5. 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 There is no evidence of significant interval healing involving the right L5 transverse process fracture. 6. Bilateral subacute healing rib fractures. Aortic Atherosclerosis (ICD10-I70.0). Electronically Signed   By: Constance Holster M.D.   On: 03/04/2019 00:02    Pending Labs Unresulted Labs (From admission, onward)    Start     Ordered   03/03/19 2201  Blood culture (routine x 2)  BLOOD CULTURE X 2,   STAT     03/03/19 2200   Signed and Held  CBC  (enoxaparin (LOVENOX)    CrCl >/= 30 ml/min)  Once,   R    Comments:  Baseline for enoxaparin therapy IF NOT ALREADY DRAWN.  Notify MD if PLT < 100 K.    Signed and Held   Signed and Held  Creatinine, serum  (enoxaparin (LOVENOX)    CrCl >/= 30 ml/min)  Once,   R    Comments: Baseline for enoxaparin therapy IF NOT ALREADY DRAWN.    Signed and Held   Signed and Held  Creatinine, serum  (enoxaparin (LOVENOX)    CrCl >/= 30 ml/min)  Weekly,   R    Comments: while on enoxaparin therapy    Signed and Held   Signed and Held  Basic metabolic panel  Tomorrow morning,   R     Signed and Held   Signed and Held  CBC  Tomorrow morning,   R     Signed and Held          Vitals/Pain Today's Vitals   03/03/19 2000 03/03/19 2200 03/03/19 2230 03/03/19 2240  BP: 101/68 92/62 97/65  97/65  Pulse: 76 75 80 80  Resp: (!) 22 19 20 20   Temp:      TempSrc:      SpO2: 96% 94% 93% 94%  Weight:      Height:      PainSc:        Isolation Precautions Airborne and Contact precautions  Medications Medications  cefTRIAXone (ROCEPHIN) 2 g in sodium chloride 0.9 % 100 mL IVPB (0 g Intravenous Stopped 03/04/19 0057)  azithromycin (ZITHROMAX) 500 mg in sodium chloride 0.9 % 250 mL IVPB (0 mg Intravenous Stopped 03/04/19  2518)  HYDROcodone-acetaminophen (NORCO/VICODIN) 5-325 MG per tablet 1 tablet (1 tablet Oral Given 03/03/19 2008)  ondansetron [facility]) injection 4 mg (4 mg Intravenous Given 03/03/19 2118)  sodium chloride 0.9 % bolus 250 mL (250 mLs Intravenous New Bag/Given 03/03/19 2156)  iohexol (OMNIPAQUE) 300 MG/ML solution 100 mL (100 mLs Intravenous Contrast Given 03/03/19 2320)  sodium chloride 0.9 % bolus 500 mL (500 mLs Intravenous New Bag/Given 03/04/19 0058)    Mobility walks with device Moderate fall risk   Focused Assessments Neuro Assessment Handoff:  Swallow screen pass? Yes  Cardiac Rhythm: Normal sinus rhythm       Neuro Assessment:   Neuro Checks:      Last Documented NIHSS Modified Score:   Has TPA been given? No If patient is a Neuro Trauma and  patient is going to OR before floor call report to [place] nurse: [phone] or [phone]     R Recommendations: See Admitting Provider Note  Report given to:   Additional Notes:  Pt forgetful at baseline per family.

## 2019-03-05 MED ORDER — AMOXICILLIN-POT CLAVULANATE 875-125 MG PO TABS: 1.0000 | ORAL_TABLET | Freq: Two times a day (BID) | ORAL | 0 refills | Status: AC

## 2019-03-05 NOTE — TOC Transition Note (Signed)
Transition of Care [facility]) - CM/SW Discharge Note   Patient Details  Name: Carl [name] MRN: [id number] Date of Birth: 1950/10/30  [facility] CM/SW Contact:  Latanya Maudlin, RN Phone Number: 03/05/2019, 11:25 AM   Clinical Narrative:  Patient to be discharged per MD order. Orders in place for home health services. Patient active with [facility] and wishes to resume. Notified Helene Kelp of discharge. No DME needs.      Final next level of care: [place] Barriers to Discharge: No Barriers Identified   Patient Goals and CMS Choice Patient states their goals for this hospitalization and ongoing recovery are:: To feel better. CMS Medicare.gov Compare Post Acute Care list provided to:: (Patient is open to [facility] for home health and wants to continue to use them.) Choice offered to / list presented to : Patient  Discharge Placement                       Discharge Plan and Services In-house Referral: Clinical Social Work Discharge Planning Services: CM Consult                      HH Arranged: Therapist, sports, PT [place] Agency: [facility] (formerly [employer]) Date [place]: 03/05/19 Time [place]: 1124 Representative spoke with at [place]: [place] ([place]) Interventions     Readmission Risk Interventions Readmission Risk Prevention Plan 03/05/2019 12/31/2018  Transportation Screening Complete Complete  PCP or Specialist Appt within 3-5 Days Complete Complete  HRI or [place] Complete Complete  Social Work Consult for [place] Planning/Counseling Complete Complete  Palliative Care Screening Complete Not Applicable  Medication Review Press photographer) Complete Complete  Some recent data might be hidden

## 2019-03-05 NOTE — Discharge Summary (Signed)
[facility] NAME: Carl [name]    MR#:  [id number]  DATE OF BIRTH:  04-Jun-1951  DATE OF ADMISSION:  03/03/2019 ADMITTING PHYSICIAN: Lance Coon, MD  DATE OF DISCHARGE: 03/05/2019   PRIMARY CARE PHYSICIAN: [name], MD    ADMISSION DIAGNOSIS:  Flank pain [R10.9] Pneumonia of left lower lobe due to infectious organism (HCC) [J18.1] Non-intractable vomiting with nausea, unspecified vomiting type [R11.2]  DISCHARGE DIAGNOSIS:  Principal Problem:   HCAP (healthcare-associated pneumonia) Active Problems:   Hyperlipidemia   Essential hypertension   CAD (coronary artery disease)   GERD (gastroesophageal reflux disease)   CKD (chronic kidney disease), stage III (HCC)   Chronic systolic CHF (congestive heart failure) (HCC)   GAD (generalized anxiety disorder)   SECONDARY DIAGNOSIS:   Past Medical History:  Diagnosis Date  . AICD (automatic cardioverter/defibrillator) present   . Anemia   . Anxiety   . Arthritis   . Brunner's gland hyperplasia of duodenum   . CHF (congestive heart failure) ([place])   . Coronary artery disease   . External hemorrhoids   . Fracture of femoral neck, left ([place]) 07/21/2018  . GERD (gastroesophageal reflux disease)   . Hearing loss   . HH (hiatus hernia)   . Internal hemorrhoids   . Ischemic cardiomyopathy   . Leg fracture, left   . Myocardial infarction ([place])   . Paronychia   . Pneumonia   . Psoriasis   . PUD (peptic ulcer disease)   . Schatzki's ring   . Tubular adenoma of colon   . Vitiligo     HOSPITAL COURSE:   *HCAP (healthcare-associated pneumonia) - IV antibiotics started. PRN duonebs and supportive treatment.  MRSA negative, cont cefepime, stopped vancomycin,  Much improved, will give Augmentin for 3 more days.  *Essential hypertension - hold antihypertensives for now given soft BP *CAD (coronary artery disease) - continue home meds *Chronic systolic  CHF (congestive heart failure) (HCC) - cautious fluid administration as above, continue home meds *GAD (generalized anxiety disorder) - home dose anxiolytic *Hyperlipidemia - home dose antilipid *CKD (chronic kidney disease), stage III (HCC) - at baseline, avoid nephrotoxins and monitor  Pt was able to walk fine with a walker with nurse, cont home health services.  DISCHARGE CONDITIONS:   Stable.  CONSULTS OBTAINED:    DRUG ALLERGIES:   Allergies  Allergen Reactions  . Spironolactone Other (See Comments)    gynecomastia     DISCHARGE MEDICATIONS:   Allergies as of 03/05/2019      Reactions   Spironolactone Other (See Comments)   gynecomastia       Medication List    TAKE these medications   acetaminophen 325 MG tablet Commonly known as: TYLENOL Take 325 mg by mouth every 6 (six) hours as needed for mild pain or moderate pain.   albuterol (2.5 MG/3ML) 0.083% nebulizer solution Commonly known as: PROVENTIL Take 3 mLs by nebulization every 6 (six) hours as needed for wheezing or shortness of breath.   allopurinol 100 MG tablet Commonly known as: ZYLOPRIM Take 100 mg by mouth daily.   amoxicillin-clavulanate 875-125 MG tablet Commonly known as: Augmentin Take 1 tablet by mouth 2 (two) times daily for 3 days.   aspirin EC 81 MG tablet Take 1 tablet (81 mg total) by mouth daily.   atorvastatin 40 MG tablet Commonly known as: LIPITOR Take 1 tablet (40 mg total) by mouth daily at 6 PM.   carvedilol 3.125 MG  tablet Commonly known as: COREG Take 3.125 mg by mouth 2 (two) times daily with a meal.   citalopram 20 MG tablet Commonly known as: CELEXA Take 20 mg by mouth daily.   clopidogrel 75 MG tablet Commonly known as: PLAVIX Take 1 tablet (75 mg total) by mouth daily with breakfast.   docusate sodium 100 MG capsule Commonly known as: COLACE Take 100 mg by mouth daily.   Dr Darliss Ridgel Diaper 10 % Oint Generic drug: Zinc Oxide Apply 1 application  topically daily as needed (IRRITATION).   eplerenone 50 MG tablet Commonly known as: INSPRA Take 1 tablet (50 mg total) by mouth daily.   ferrous sulfate 325 (65 FE) MG tablet Take 1 tablet (325 mg total) by mouth daily with breakfast.   ketoconazole 2 % shampoo Commonly known as: NIZORAL Apply 1 application topically 2 (two) times a week.   multivitamin tablet Take 1 tablet by mouth daily.   pantoprazole 40 MG tablet Commonly known as: PROTONIX Take 1 tablet (40 mg total) by mouth daily.   potassium chloride SA 20 MEQ tablet Commonly known as: K-DUR Take 1 tablet (20 mEq total) by mouth 2 (two) times daily.   torsemide 20 MG tablet Commonly known as: DEMADEX Take 80mg  (4 tablets) in the AM and 40mg  (2 tablets) in the PM. What changed:  how much to take how to take this when to take this   vitamin B-12 500 MCG tablet Commonly known as: CYANOCOBALAMIN Take 500 mcg by mouth daily.   vitamin C 100 MG tablet Take 100 mg by mouth daily.   Vitamin D3 Super Strength 50 MCG (2000 UT) Tabs Generic drug: Cholecalciferol Take 2,000 Units by mouth daily.        DISCHARGE INSTRUCTIONS:    Follow with PMD in 1-2 weeks.  If you experience worsening of your admission symptoms, develop shortness of breath, life threatening emergency, suicidal or homicidal thoughts you must seek medical attention immediately by calling 911 or calling your MD immediately  if symptoms less severe.  You Must read complete instructions/literature along with all the possible adverse reactions/side effects for all the Medicines you take and that have been prescribed to you. Take any new Medicines after you have completely understood and accept all the possible adverse reactions/side effects.   Please note  You were cared for by a hospitalist during your hospital stay. If you have any questions about your discharge medications or the care you received while you were in the hospital after you are  discharged, you can call the unit and asked to speak with the hospitalist on call if the hospitalist that took care of you is not available. Once you are discharged, your primary care physician will handle any further medical issues. Please note that NO REFILLS for any discharge medications will be authorized once you are discharged, as it is imperative that you return to your primary care physician (or establish a relationship with a primary care physician if you do not have one) for your aftercare needs so that they can reassess your need for medications and monitor your lab values.    Today   CHIEF COMPLAINT:   Chief Complaint  Patient presents with  . Flank Pain    HISTORY OF PRESENT ILLNESS:  Hjalmar Ballengee  is a 68 y.o. male  Patient is a poor historian, but is oriented.  He presents the ED with a complaint of flank pain.  He denies any shortness of breath.  Evaluation here  shows pneumonia on x-ray and again on CT imaging.  He does not meet sepsis criteria.  Hospitalist called for admission    VITAL SIGNS:  Blood pressure 100/65, pulse 70, temperature 98.7 F (37.1 C), resp. rate 18, height 6' (1.829 m), weight 64 kg, SpO2 96 %.  I/O:    Intake/Output Summary (Last 24 hours) at 03/05/2019 1055 Last data filed at 03/04/2019 1500 Gross per 24 hour  Intake 90.4 ml  Output -  Net 90.4 ml    PHYSICAL EXAMINATION:   GENERAL:  68 y.o.-year-old patient lying in the bed with no acute distress.  EYES: Pupils equal, round, reactive to light and accommodation. No scleral icterus. Extraocular muscles intact.  HEENT: Head atraumatic, normocephalic. Oropharynx and nasopharynx clear.  NECK:  Supple, no jugular venous distention. No thyroid enlargement, no tenderness.  LUNGS: Normal breath sounds bilaterally, no wheezing,some crepitation. No use of accessory muscles of respiration.  CARDIOVASCULAR: S1, S2 normal. No murmurs, rubs, or gallops.  ABDOMEN: Soft, nontender, nondistended. Bowel  sounds present. No organomegaly or mass.  EXTREMITIES: No pedal edema, cyanosis, or clubbing.  NEUROLOGIC: Cranial nerves II through XII are intact. Muscle strength 4/5 in all extremities. Sensation intact. Gait not checked.  PSYCHIATRIC: The patient is alert and oriented x 3.  SKIN: No obvious rash, lesion, or ulcer.  DATA REVIEW:   CBC Recent Labs  Lab 03/04/19 0513  WBC 8.8  HGB 8.3*  HCT 26.2*  PLT 207    Chemistries  Recent Labs  Lab 03/03/19 2010 03/04/19 0513  NA 131* 135  K 3.5 3.3*  CL 94* 101  CO2 26 27  GLUCOSE 120* 151*  BUN 24* 24*  CREATININE 1.37* 1.39*  CALCIUM 8.8* 8.3*  AST 26  --   ALT 15  --   ALKPHOS 69  --   BILITOT 1.6*  --     Cardiac Enzymes No results for input(s): TROPONINI in the last 168 hours.  Microbiology Results  Results for orders placed or performed during the hospital encounter of 03/03/19  SARS Coronavirus 2 (CEPHEID- Performed in [facility] hospital lab), Hosp Order     Status: None   Collection Time: 03/03/19  9:10 PM   Specimen: Nasopharyngeal Swab  Result Value Ref Range Status   SARS Coronavirus 2 NEGATIVE NEGATIVE Final    Comment: (NOTE) If result is NEGATIVE SARS-CoV-2 target nucleic acids are NOT DETECTED. The SARS-CoV-2 RNA is generally detectable in upper and lower  respiratory specimens during the acute phase of infection. The lowest  concentration of SARS-CoV-2 viral copies this assay can detect is 250  copies / mL. A negative result does not preclude SARS-CoV-2 infection  and should not be used as the sole basis for treatment or other  patient management decisions.  A negative result may occur with  improper specimen collection / handling, submission of specimen other  than nasopharyngeal swab, presence of viral mutation(s) within the  areas targeted by this assay, and inadequate number of viral copies  (<250 copies / mL). A negative result must be combined with clinical  observations, patient history, and  epidemiological information. If result is POSITIVE SARS-CoV-2 target nucleic acids are DETECTED. The SARS-CoV-2 RNA is generally detectable in upper and lower  respiratory specimens dur ing the acute phase of infection.  Positive  results are indicative of active infection with SARS-CoV-2.  Clinical  correlation with patient history and other diagnostic information is  necessary to determine patient infection status.  Positive results do  not rule out bacterial infection or co-infection with other viruses. If result is PRESUMPTIVE POSTIVE SARS-CoV-2 nucleic acids MAY BE PRESENT.   A presumptive positive result was obtained on the submitted specimen  and confirmed on repeat testing.  While 2019 novel coronavirus  (SARS-CoV-2) nucleic acids may be present in the submitted sample  additional confirmatory testing may be necessary for epidemiological  and / or clinical management purposes  to differentiate between  SARS-CoV-2 and other Sarbecovirus currently known to infect humans.  If clinically indicated additional testing with an alternate test  methodology [phone]) is advised. The SARS-CoV-2 RNA is generally  detectable in upper and lower respiratory sp ecimens during the acute  phase of infection. The expected result is Negative. Fact Sheet for Patients:  StrictlyIdeas.no Fact Sheet for Healthcare Providers: BankingDealers.co.za This test is not yet approved or cleared by the [place] FDA and has been authorized for detection and/or diagnosis of SARS-CoV-2 by FDA under an Emergency Use Authorization (EUA).  This EUA will remain in effect (meaning this test can be used) for the duration of the COVID-19 declaration under Section 564(b)(1) of the Act, 21 U.S.C. section 360bbb-3(b)(1), unless the authorization is terminated or revoked sooner. Performed at [facility], [address] [place], [place] [address]   Blood  culture (routine x 2)     Status: None (Preliminary result)   Collection Time: 03/03/19 10:43 PM   Specimen: BLOOD  Result Value Ref Range Status   Specimen Description BLOOD RIGHT FATTY CASTS  Final   Special Requests   Final    BOTTLES DRAWN AEROBIC AND ANAEROBIC Blood Culture adequate volume   Culture   Final    NO GROWTH 2 DAYS Performed at [facility], [address] [place], [place] [address]    Report Status PENDING  Incomplete  Blood culture (routine x 2)     Status: None (Preliminary result)   Collection Time: 03/03/19 10:43 PM   Specimen: BLOOD  Result Value Ref Range Status   Specimen Description BLOOD RIGHT FATTY CASTS  Final   Special Requests   Final    BOTTLES DRAWN AEROBIC AND ANAEROBIC Blood Culture adequate volume   Culture   Final    NO GROWTH 2 DAYS Performed at [facility], [address] [place], [place] [address]    Report Status PENDING  Incomplete  MRSA PCR Screening     Status: None   Collection Time: 03/04/19  4:16 AM   Specimen: Nasal Mucosa; Nasopharyngeal  Result Value Ref Range Status   MRSA by PCR NEGATIVE NEGATIVE Final    Comment:        The GeneXpert MRSA Assay (FDA approved for NASAL specimens only), is one component of a comprehensive MRSA colonization surveillance program. It is not intended to diagnose MRSA infection nor to guide or monitor treatment for MRSA infections. Performed at [facility], [address] [place], [place] [address]     RADIOLOGY:  Dg Chest 2 View  Result Date: 03/03/2019 CLINICAL DATA:  Epigastric and back pain. EXAM: CHEST - 2 VIEW COMPARISON:  12/27/2018 FINDINGS: AP and lateral views were obtained. The cardio pericardial silhouette is enlarged. There is pulmonary vascular congestion without overt pulmonary edema. Interstitial markings are diffusely coarsened with chronic features. Lateral film shows patchy airspace disease in the posterior lower lobe, likely left period  bones are diffusely demineralized. Nonacute right rib fractures evident. Pacer/AICD again noted. IMPRESSION: Patchy left lower lobe airspace opacity. Imaging features  suggest pneumonia. Cardiomegaly with chronic interstitial changes. Electronically Signed   By: Misty Stanley M.D.   On: 03/03/2019 20:33   Ct Abdomen Pelvis W Contrast  Result Date: 03/04/2019 CLINICAL DATA:  Acute abdominal pain.  Back pain and epigastric pain EXAM: CT ABDOMEN AND PELVIS WITH CONTRAST TECHNIQUE: Multidetector CT imaging of the abdomen and pelvis was performed using the standard protocol following bolus administration of intravenous contrast. CONTRAST:  117mL OMNIPAQUE IOHEXOL 300 MG/ML  SOLN COMPARISON:  None. FINDINGS: Lower chest: There is airspace consolidation at the left lung base.Heart is significantly enlarged. An Amplatzer plug is noted. Hepatobiliary: The liver demonstrates cirrhotic morphology. Normal gallbladder.There is no biliary ductal dilation. Pancreas: Normal contours without ductal dilatation. No peripancreatic fluid collection. Spleen: No splenic laceration or hematoma. Adrenals/Urinary Tract: --Adrenal glands: No adrenal hemorrhage. --Right kidney/ureter: No hydronephrosis or perinephric hematoma. --Left kidney/ureter: No hydronephrosis or perinephric hematoma. --Urinary bladder: The urinary bladder is decompressed which limits evaluation. Stomach/Bowel: --Stomach/Duodenum: No hiatal hernia or other gastric abnormality. Normal duodenal course and caliber. --Small bowel: No dilatation or inflammation. --Colon: No focal abnormality. There appears to be some fat stranding in the region of the hepatic flexure, however the colon appears essentially normal in this location. --Appendix: Normal. Vascular/Lymphatic: Atherosclerotic calcification is present within the non-aneurysmal abdominal aorta, without hemodynamically significant stenosis. --No retroperitoneal lymphadenopathy. --No mesenteric lymphadenopathy. --No  pelvic or inguinal lymphadenopathy. Reproductive: Unremarkable Other: No ascites or free air. The abdominal wall is normal. Musculoskeletal. 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 There is a nondisplaced fracture of the right L5 transverse process without evidence of interval healing. There are subacute bilateral rib fractures. IMPRESSION: 1. Consolidation involving the left lower lobe concerning for pneumonia. 2. No acute intra-abdominal abnormality. 3. Cirrhosis. 4. Cardiomegaly. 5. 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 There is no evidence of significant interval healing involving the right L5 transverse process fracture. 6. Bilateral subacute healing rib fractures. Aortic Atherosclerosis (ICD10-I70.0). Electronically Signed   By: Constance Holster M.D.   On: 03/04/2019 00:02    EKG:   Orders placed or performed during the hospital encounter of 02/09/19  . EKG 12-Lead  . EKG 12-Lead  . EKG      Management plans discussed with the patient, family and they are in agreement.  CODE STATUS:     Code Status Orders  (From admission, onward)         Start     Ordered   03/04/19 0244  Full code  Continuous     03/04/19 0243        Code Status History    Date Active Date Inactive Code Status Order ID Comments User Context   02/10/2019 0517 02/10/2019 2006 Full Code [id number]  Beverely Pace ED   12/30/2018 1257 12/31/2018 1732 Full Code [id number]  [name], MD Inpatient   12/21/2018 0912 12/21/2018 1540 Full Code [id number]  [name], MD Inpatient   07/21/2018 1944 07/27/2018 1554 Full Code [id number]  Ivor Costa, MD ED   06/11/2017 0538 06/12/2017 1547 Full Code [id number]  Harrie Foreman, MD Inpatient   06/10/2016 1916 06/12/2016 2340 Full Code [id number]  Maryellen Pile, MD Inpatient   05/01/2015 1514 05/02/2015 1548 Full Code [id number]  Thompson Grayer, MD Inpatient   08/03/2014 1332 08/17/2014 1636 Full Code [id number]  John Giovanni, PA-C Inpatient   07/27/2014 1646 08/03/2014 1332 Full Code [id number]  Erlene Quan, PA-C Inpatient   Advance Care Planning Activity  TOTAL TIME TAKING CARE OF THIS PATIENT: 35 minutes.    Vaughan Basta M.D on 03/05/2019 at 10:55 AM  Between 7am to 6pm - Pager - [phone]  After 6pm go to [email or web address] - password [email or web address] [facility]  Office  [phone]  CC: Primary care physician; [name], MD   Note: This dictation was prepared with Dragon dictation along with smaller phrase technology. Any transcriptional errors that result from this process are unintentional.

## 2019-03-07 ENCOUNTER — Encounter: Payer: Self-pay | Admitting: Cardiology

## 2019-03-07 ENCOUNTER — Telehealth (HOSPITAL_COMMUNITY): Payer: Self-pay

## 2019-03-07 ENCOUNTER — Telehealth (HOSPITAL_COMMUNITY): Payer: Self-pay | Admitting: Licensed Clinical Social Worker

## 2019-03-07 ENCOUNTER — Ambulatory Visit (INDEPENDENT_AMBULATORY_CARE_PROVIDER_SITE_OTHER): Payer: Medicare Other

## 2019-03-07 DIAGNOSIS — I5022 Chronic systolic (congestive) heart failure: Secondary | ICD-10-CM | POA: Diagnosis not present

## 2019-03-07 DIAGNOSIS — Z95810 Presence of automatic (implantable) cardiac defibrillator: Secondary | ICD-10-CM | POA: Diagnosis not present

## 2019-03-07 NOTE — Telephone Encounter (Signed)
Pt enrolled in [employer] 3 month study through [facility]- patient's 3 month period has now ended so CSW spoke with pt to complete an exit interview regarding patient's experience:  1. Since being on [employer] Me[employer] have you noticed any changes in your health or the way you feel physically? no Weight loss? no Improvement in BP? no  2. What do you like about the program? Was easy to prepare and liked the way it tasted. What would you like to change/add? No changes  3. How did you get food prior to [place]? a. Did you get food stamps? no b. Did someone help financially? no c. Did you access food banks?no d. Do you anticipate any issues transitioning back to getting your own food after this program ends? no  4. Would you be interested in future food pilots? yes  5. Would you like a dietary consult? yes  6. Do you have internet access? Tablet/Smart Phone? no   Jorge Ny, LCSW Clinical Social Worker [facility] Desk#: [phone] Cell#: [phone]

## 2019-03-07 NOTE — Progress Notes (Signed)
EPIC Encounter for ICM Monitoring  Patient Name: Carl [name] is a 68 y.o. male Date: 03/07/2019 Primary Care Physican: [name], MD Primary Cardiologist:[place] Electrophysiologist: Allred Last Weight:150lbs(01/18/2019 office visit) 03/07/2019 Weight: 149 lbs  Since 04-Feb-2019 AF  205 Time in AF  3.1 hr/day (12.9%)  Observations (1) (04-Feb-2019 to 07-Mar-2019) Alert: AF >= 6 hr for 8 days   Transmission reviewed and spoke with brother.  Patient hospitalized 7/7 - 7/11 but is doing fine at this time.  Denies patient has any fluid symptoms.  Weight was 156 lbs last week but today is back to baseline 149 lbs.    OptivolThoracic impedancesuggestive of possible fluid accumulation since 02/26/2019 but trending back toward baseline.  Prescribed:Torsemide20mg take4tablets(80 mg total)every morning and 40 mg every evening (increased at HF clinic OV 01/18/2019)  Labs: 03/04/2019 Creatinine 1.39, BUN 24, Potassium 3.3, Sodium 135, GFR 52->60 03/03/2019 Creatinine 1.37, BUN 24, Potassium 3.4, Sodium 131, GFR 53->60  02/09/2019 Creatinine 1.75, BUN 28, Potassium 4.3, Sodium 127, GFR 39-46  02/02/2019 Creatinine 1.59, BUN 24, Potassium 4.1, Sodium 131, GFR 44-51  01/27/2019 Creatinine 1.41, BUN 24, Potassium 3.9, Sodium 130, GFR 51-59  01/18/2019 Creatinine 1.28, BUN 28, Potassium 3.2, Sodium 136, GFR 58->60, BNP 1261.0 A complete set of results can be found in Results Review  Recommendations: Advised will send to Dr [name] for review and if any recommendations will call him back.  Will notify [name] with paramedicine program if any med changes and she will be filling med box tomorrow, 03/08/2019.  Follow-up plan: ICM clinic phone appointment on7/20/2020 (manual send) to recheck fluid levels.  Office visit with Dr [name] 03/22/2019.  Copy of ICM check sent to Dr.Allred and Dr [name].  3 month ICM trend: 03/07/2019    AT/AF   1 Year  ICM trend:       Rosalene Billings, RN 03/07/2019 12:54 PM

## 2019-03-07 NOTE — Telephone Encounter (Signed)
I contacted Carl [name] to see how he was feeling since discharge from hospital with pneumonia.  He states feeling ok, he states been having several bowel movements and has to strain.  Asked him if constipated or diarrhea, he can not explain he says.  Asked his brother [name] about it and he states he can not understand what he is talking about.  He is aware and taking his amoxicillin that was prescribed at hospital.  He is trying to stay active, complaining his legs hurt today.  He states eating ok.  Drinking water.  His sister is there with him today.  Will visit with him at home tomorrow to refill med boxes and to check on him.   [place] [phone]

## 2019-03-08 ENCOUNTER — Encounter (HOSPITAL_COMMUNITY): Payer: Self-pay

## 2019-03-08 ENCOUNTER — Other Ambulatory Visit: Payer: Self-pay | Admitting: Physician Assistant

## 2019-03-08 ENCOUNTER — Other Ambulatory Visit (HOSPITAL_COMMUNITY): Payer: Self-pay

## 2019-03-08 LAB — CULTURE, BLOOD (ROUTINE X 2)
Culture: NO GROWTH
Culture: NO GROWTH
Special Requests: ADEQUATE
Special Requests: ADEQUATE

## 2019-03-08 NOTE — Progress Notes (Signed)
Today was a home visit with Carl [name] to fix his medication boxes.  He appears to be feeling good today, he is up and about.  Only complaint is legs hurting and appears to be constipated.  He is taking stool softner everyday, added back his senakot to his med boxes.  He denies chest pain, abdominal tightness, shortness of breath, headaches or dizziness.  He has no swelling in lower extremities.  Breathing appears good, speaking in full sentences.  His brother is concerned with his well being of staying by his self during the day and able to take his medication at the same time everyday, he is going to look into a sitter, does not appear his daughter is going to be staying with him.  Called into pharmacy for senakot and plavix refills. Meds verified and boxes refilled.  Him and his family is trying to get him to eat better and he has cut back on pepso and drinking flavored water.  Will continue to visit for heart failure, medication compliance and diet.   [place] [phone]

## 2019-03-09 NOTE — Telephone Encounter (Signed)
Transmission received 03-08-2019

## 2019-03-11 NOTE — Progress Notes (Signed)
No changes

## 2019-03-14 ENCOUNTER — Ambulatory Visit (INDEPENDENT_AMBULATORY_CARE_PROVIDER_SITE_OTHER): Payer: Medicare Other

## 2019-03-14 DIAGNOSIS — I5022 Chronic systolic (congestive) heart failure: Secondary | ICD-10-CM

## 2019-03-14 DIAGNOSIS — Z95810 Presence of automatic (implantable) cardiac defibrillator: Secondary | ICD-10-CM

## 2019-03-15 ENCOUNTER — Telehealth: Payer: Self-pay

## 2019-03-15 NOTE — Progress Notes (Signed)
EPIC Encounter for ICM Monitoring  Patient Name: Carl [name] is a 68 y.o. male Date: 03/15/2019 Primary Care Physican: [name], MD Primary Cardiologist:[place] Electrophysiologist: Allred Last Weight:150lbs(01/18/2019 office visit) 03/07/2019 Weight: 149 lbs    Transmission reviewed.   OptivolThoracic impedancereturned to normal since 03/07/2019 remote transmission.  Prescribed:Torsemide20mg take4tablets(80 mg total)every morning and 40 mg every evening (increased at HF clinic OV 01/18/2019)  Labs: 03/04/2019 Creatinine 1.39, BUN 24, Potassium 3.3, Sodium 135, GFR 52->60 03/03/2019 Creatinine 1.37, BUN 24, Potassium 3.4, Sodium 131, GFR 53->60  02/09/2019 Creatinine 1.75, BUN 28, Potassium 4.3, Sodium 127, GFR 39-46  A complete set of results can be found in Results Review  Recommendations: None  Follow-up plan: ICM clinic phone appointment on8/17/2020.  Office visit with Dr [name] 03/22/2019.  Copy of ICM check sent to Dr.Allred.   3 month ICM trend: 03/15/2019    1 Year ICM trend:       Rosalene Billings, RN 03/15/2019 11:11 AM

## 2019-03-15 NOTE — Telephone Encounter (Signed)
Spoke with patient to remind of missed remote transmission 

## 2019-03-19 ENCOUNTER — Other Ambulatory Visit (HOSPITAL_COMMUNITY): Payer: Self-pay | Admitting: Cardiology

## 2019-03-22 ENCOUNTER — Encounter (HOSPITAL_COMMUNITY): Payer: Self-pay | Admitting: Cardiology

## 2019-03-22 ENCOUNTER — Ambulatory Visit (HOSPITAL_COMMUNITY)
Admission: RE | Admit: 2019-03-22 | Discharge: 2019-03-22 | Disposition: A | Discharge status: Home / Self Care | Payer: Medicare Other | Source: Ambulatory Visit | Attending: Cardiology | Admitting: Cardiology

## 2019-03-22 ENCOUNTER — Encounter (HOSPITAL_COMMUNITY): Payer: Self-pay

## 2019-03-22 ENCOUNTER — Other Ambulatory Visit (HOSPITAL_COMMUNITY): Payer: Self-pay

## 2019-03-22 ENCOUNTER — Other Ambulatory Visit: Payer: Self-pay | Admitting: Physician Assistant

## 2019-03-22 ENCOUNTER — Other Ambulatory Visit: Payer: Self-pay

## 2019-03-22 VITALS — BP 100/70 | HR 75 | Wt 148.8 lb

## 2019-03-22 DIAGNOSIS — Z79899 Other long term (current) drug therapy: Secondary | ICD-10-CM | POA: Diagnosis not present

## 2019-03-22 DIAGNOSIS — Z8249 Family history of ischemic heart disease and other diseases of the circulatory system: Secondary | ICD-10-CM | POA: Diagnosis not present

## 2019-03-22 DIAGNOSIS — F70 Mild intellectual disabilities: Secondary | ICD-10-CM | POA: Diagnosis not present

## 2019-03-22 DIAGNOSIS — R0602 Shortness of breath: Secondary | ICD-10-CM | POA: Diagnosis present

## 2019-03-22 DIAGNOSIS — Z833 Family history of diabetes mellitus: Secondary | ICD-10-CM | POA: Insufficient documentation

## 2019-03-22 DIAGNOSIS — M109 Gout, unspecified: Secondary | ICD-10-CM | POA: Diagnosis not present

## 2019-03-22 DIAGNOSIS — Z7982 Long term (current) use of aspirin: Secondary | ICD-10-CM | POA: Diagnosis not present

## 2019-03-22 DIAGNOSIS — I255 Ischemic cardiomyopathy: Secondary | ICD-10-CM

## 2019-03-22 DIAGNOSIS — I252 Old myocardial infarction: Secondary | ICD-10-CM | POA: Diagnosis not present

## 2019-03-22 DIAGNOSIS — I2510 Atherosclerotic heart disease of native coronary artery without angina pectoris: Secondary | ICD-10-CM | POA: Insufficient documentation

## 2019-03-22 DIAGNOSIS — Z95810 Presence of automatic (implantable) cardiac defibrillator: Secondary | ICD-10-CM | POA: Insufficient documentation

## 2019-03-22 DIAGNOSIS — I4510 Unspecified right bundle-branch block: Secondary | ICD-10-CM | POA: Diagnosis not present

## 2019-03-22 DIAGNOSIS — I5022 Chronic systolic (congestive) heart failure: Secondary | ICD-10-CM

## 2019-03-22 DIAGNOSIS — I493 Ventricular premature depolarization: Secondary | ICD-10-CM | POA: Diagnosis not present

## 2019-03-22 DIAGNOSIS — Z87891 Personal history of nicotine dependence: Secondary | ICD-10-CM | POA: Diagnosis not present

## 2019-03-22 DIAGNOSIS — Z7902 Long term (current) use of antithrombotics/antiplatelets: Secondary | ICD-10-CM | POA: Diagnosis not present

## 2019-03-22 DIAGNOSIS — I340 Nonrheumatic mitral (valve) insufficiency: Secondary | ICD-10-CM | POA: Diagnosis not present

## 2019-03-22 DIAGNOSIS — D649 Anemia, unspecified: Secondary | ICD-10-CM | POA: Insufficient documentation

## 2019-03-22 DIAGNOSIS — F329 Major depressive disorder, single episode, unspecified: Secondary | ICD-10-CM | POA: Insufficient documentation

## 2019-03-22 DIAGNOSIS — Z951 Presence of aortocoronary bypass graft: Secondary | ICD-10-CM | POA: Diagnosis not present

## 2019-03-22 DIAGNOSIS — N183 Chronic kidney disease, stage 3 (moderate): Secondary | ICD-10-CM | POA: Insufficient documentation

## 2019-03-22 DIAGNOSIS — Z8719 Personal history of other diseases of the digestive system: Secondary | ICD-10-CM | POA: Diagnosis not present

## 2019-03-22 LAB — BASIC METABOLIC PANEL
Anion gap: 15 (ref 5–15)
BUN: 35 mg/dL — ABNORMAL HIGH (ref 8–23)
CO2: 25 mmol/L (ref 22–32)
Calcium: 9.4 mg/dL (ref 8.9–10.3)
Chloride: 91 mmol/L — ABNORMAL LOW (ref 98–111)
Creatinine, Ser: 1.45 mg/dL — ABNORMAL HIGH (ref 0.61–1.24)
GFR calc Af Amer: 57 mL/min — ABNORMAL LOW (ref >60)
GFR calc non Af Amer: 49 mL/min — ABNORMAL LOW (ref >60)
Glucose, Bld: 85 mg/dL (ref 70–99)
Potassium: 4 mmol/L (ref 3.5–5.1)
Sodium: 131 mmol/L — ABNORMAL LOW (ref 135–145)

## 2019-03-22 LAB — CBC
HCT: 35.8 % — ABNORMAL LOW (ref 39.0–52.0)
Hemoglobin: 11.4 g/dL — ABNORMAL LOW (ref 13.0–17.0)
MCH: 32.5 pg (ref 26.0–34.0)
MCHC: 31.8 g/dL (ref 30.0–36.0)
MCV: 102 fL — ABNORMAL HIGH (ref 80.0–100.0)
Platelets: 251 10*3/uL (ref 150–400)
RBC: 3.51 MIL/uL — ABNORMAL LOW (ref 4.22–5.81)
RDW: 15.5 % (ref 11.5–15.5)
WBC: 7.7 10*3/uL (ref 4.0–10.5)
nRBC: 0 % (ref 0.0–0.2)

## 2019-03-22 LAB — IRON AND TIBC
Iron: 51 ug/dL (ref 45–182)
Saturation Ratios: 10 % — ABNORMAL LOW (ref 17.9–39.5)
TIBC: 493 ug/dL — ABNORMAL HIGH (ref 250–450)
UIBC: 442 ug/dL

## 2019-03-22 LAB — FERRITIN: Ferritin: 27 ng/mL (ref 24–336)

## 2019-03-22 MED ORDER — CARVEDILOL 6.25 MG PO TABS: 6.2500 mg | ORAL_TABLET | Freq: Two times a day (BID) | ORAL | 3 refills | Status: DC

## 2019-03-22 NOTE — Patient Instructions (Addendum)
INCREASE Coreg to 6.25mg  (1 tab) twice a day  Labs today We will only contact you if something comes back abnormal or we need to make some changes. Otherwise no news is good news!  Your physician recommends that you schedule a follow-up appointment in: 2 months with Dr [name], Monday September 28th, 2020 at 10:20a  At the [facility], you and your health needs are our priority. As part of our continuing mission to provide you with exceptional heart care, we have created designated Provider Care Teams. These Care Teams include your primary Cardiologist (physician) and Advanced Practice Providers (APPs- Physician Assistants and Nurse Practitioners) who all work together to provide you with the care you need, when you need it.   You may see any of the following providers on your designated Care Team at your next follow up: Marland Kitchen Dr [name] . Dr [name] . Darrick Grinder, NP

## 2019-03-22 NOTE — Progress Notes (Signed)
Today was a home visit with Carl [name].  Reminded him and brother Carl [name] of appt with HF clinic today.  He states doing ok, his feet hurt and legs hurt.  He denies chest pain, shortness of breath, headache or dizziness.  Abdomen not tight and has no edema in lower extremities.  Lungs are clear.  His weight is fluctuating day by day, he goes from 140 to 147 in a day, discussed maybe its the way he is standing on it or not waiting on it to finish, not sure, he weighed in front of me today.  He is ambulating well now.  He is eating well, still advise him to watch high sodium foods and advise his family of same since they do a lot of his food prep and shopping.  It appears he has been taking all his medications, verified them and refilled his med box.  He is out of refills for protonix, contacted pharmacy.  His plavix is out of refills, he has about a week left, he has cardiology appt today they may stop it since it has been 3 months since his procedure.  Will watch for notes on plavix or any other changes to his medications today.  He appears to be doing better.  He states been drinking his flavored water and not as much pepsi.  His family are trying to sort his snacks and places them in zip lock bags to control his over eating something.  Will continue to visit for heart failure, diet and medication compliance.   [place] [phone]

## 2019-03-23 ENCOUNTER — Other Ambulatory Visit (HOSPITAL_COMMUNITY): Payer: Self-pay

## 2019-03-23 ENCOUNTER — Telehealth (HOSPITAL_COMMUNITY): Payer: Self-pay

## 2019-03-23 DIAGNOSIS — E611 Iron deficiency: Secondary | ICD-10-CM

## 2019-03-23 MED ORDER — SODIUM CHLORIDE 0.9 % IV SOLN: 510.0000 mg | Freq: Once | INTRAVENOUS | Status: DC

## 2019-03-23 MED ORDER — CLOPIDOGREL BISULFATE 75 MG PO TABS: 75.0000 mg | ORAL_TABLET | Freq: Every day | ORAL | 3 refills | Status: DC
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
## 2019-03-23 NOTE — Progress Notes (Signed)
Orders placed for Iron infusion, scheduled for 03/31/19 1pm. Brother mike made aware. Verbalized understanding.

## 2019-03-23 NOTE — Telephone Encounter (Signed)
Spoke with [place] his brother and [name] his sister about coming out this am at 10:30 to change his medications boxes due to change in meds yesterday.   When arrived no one was home, waited 10 minutes and contacted [employer], he has taken off with his sister [name] and at [employer].  Contacted [name] and [name] will call me this afternoon and will advise him what to change in [facility]'s box.    [place] [phone]

## 2019-03-30 ENCOUNTER — Other Ambulatory Visit: Payer: Self-pay | Admitting: Physician Assistant

## 2019-03-31 ENCOUNTER — Other Ambulatory Visit: Payer: Self-pay | Admitting: Cardiovascular Disease

## 2019-03-31 MED ORDER — PANTOPRAZOLE SODIUM 40 MG PO TBEC: 40.0000 mg | DELAYED_RELEASE_TABLET | Freq: Every day | ORAL | 2 refills | Status: DC

## 2019-03-31 NOTE — Telephone Encounter (Signed)
 *  STAT* If patient is at the pharmacy, call can be transferred to refill team.   1. Which medications need to be refilled? (please list name of each medication and dose if known) pantoprazole (PROTONIX) 40 MG tablet  2. Which pharmacy/location (including street and city if local pharmacy) is medication to be sent to? [facility] [place]  3. Do they need a 30 day or 90 day supply? 90  Patient is completely out

## 2019-03-31 NOTE — Telephone Encounter (Signed)
Pt's medication was sent to pt's pharmacy as requested. Confirmation received.  °

## 2019-04-01 ENCOUNTER — Other Ambulatory Visit (HOSPITAL_COMMUNITY): Payer: Self-pay

## 2019-04-01 ENCOUNTER — Other Ambulatory Visit: Payer: Self-pay | Admitting: Cardiology

## 2019-04-05 ENCOUNTER — Other Ambulatory Visit (HOSPITAL_COMMUNITY): Payer: Self-pay

## 2019-04-05 ENCOUNTER — Encounter: Payer: Medicare Other | Admitting: *Deleted

## 2019-04-06 ENCOUNTER — Ambulatory Visit (HOSPITAL_COMMUNITY)
Admission: RE | Admit: 2019-04-06 | Discharge: 2019-04-06 | Disposition: A | Discharge status: Home / Self Care | Payer: Medicare Other | Source: Ambulatory Visit | Attending: Cardiology | Admitting: Cardiology

## 2019-04-06 DIAGNOSIS — E611 Iron deficiency: Secondary | ICD-10-CM | POA: Insufficient documentation

## 2019-04-06 MED ORDER — SODIUM CHLORIDE 0.9 % IV SOLN
510.0000 mg | Freq: Once | INTRAVENOUS | Status: AC
  Administered 2019-04-06: 510 mg via INTRAVENOUS
  Filled 2019-04-06: qty 17

## 2019-04-12 ENCOUNTER — Telehealth: Payer: Self-pay

## 2019-04-12 NOTE — Telephone Encounter (Signed)
Unable to speak  with patient to remind of missed remote transmission 

## 2019-04-14 ENCOUNTER — Other Ambulatory Visit (HOSPITAL_COMMUNITY): Payer: Self-pay

## 2019-04-14 ENCOUNTER — Other Ambulatory Visit: Payer: Self-pay

## 2019-04-14 ENCOUNTER — Emergency Department: Payer: Medicare Other

## 2019-04-14 ENCOUNTER — Emergency Department
Admission: EM | Admit: 2019-04-14 | Discharge: 2019-04-14 | Disposition: A | Discharge status: Home / Self Care | Payer: Medicare Other | Attending: Emergency Medicine | Admitting: Emergency Medicine

## 2019-04-14 ENCOUNTER — Other Ambulatory Visit (HOSPITAL_COMMUNITY): Payer: Self-pay | Admitting: Adult Health

## 2019-04-14 ENCOUNTER — Encounter: Payer: Self-pay | Admitting: Emergency Medicine

## 2019-04-14 DIAGNOSIS — Z951 Presence of aortocoronary bypass graft: Secondary | ICD-10-CM | POA: Diagnosis not present

## 2019-04-14 DIAGNOSIS — I130 Hypertensive heart and chronic kidney disease with heart failure and stage 1 through stage 4 chronic kidney disease, or unspecified chronic kidney disease: Secondary | ICD-10-CM | POA: Insufficient documentation

## 2019-04-14 DIAGNOSIS — I2510 Atherosclerotic heart disease of native coronary artery without angina pectoris: Secondary | ICD-10-CM | POA: Insufficient documentation

## 2019-04-14 DIAGNOSIS — Z87891 Personal history of nicotine dependence: Secondary | ICD-10-CM | POA: Insufficient documentation

## 2019-04-14 DIAGNOSIS — I252 Old myocardial infarction: Secondary | ICD-10-CM | POA: Insufficient documentation

## 2019-04-14 DIAGNOSIS — R109 Unspecified abdominal pain: Secondary | ICD-10-CM | POA: Insufficient documentation

## 2019-04-14 DIAGNOSIS — K5900 Constipation, unspecified: Secondary | ICD-10-CM | POA: Insufficient documentation

## 2019-04-14 DIAGNOSIS — I5022 Chronic systolic (congestive) heart failure: Secondary | ICD-10-CM | POA: Diagnosis not present

## 2019-04-14 DIAGNOSIS — Z79899 Other long term (current) drug therapy: Secondary | ICD-10-CM | POA: Diagnosis not present

## 2019-04-14 DIAGNOSIS — Z7982 Long term (current) use of aspirin: Secondary | ICD-10-CM | POA: Insufficient documentation

## 2019-04-14 DIAGNOSIS — N183 Chronic kidney disease, stage 3 (moderate): Secondary | ICD-10-CM | POA: Diagnosis not present

## 2019-04-14 DIAGNOSIS — Z7902 Long term (current) use of antithrombotics/antiplatelets: Secondary | ICD-10-CM | POA: Insufficient documentation

## 2019-04-14 LAB — URINALYSIS, COMPLETE (UACMP) WITH MICROSCOPIC
Bilirubin Urine: NEGATIVE
Glucose, UA: NEGATIVE mg/dL
Hgb urine dipstick: NEGATIVE
Ketones, ur: NEGATIVE mg/dL
Leukocytes,Ua: NEGATIVE
Nitrite: NEGATIVE
Protein, ur: NEGATIVE mg/dL
Specific Gravity, Urine: 1.005 (ref 1.005–1.030)
Squamous Epithelial / HPF: NONE SEEN (ref 0–5)
WBC, UA: NONE SEEN WBC/hpf (ref 0–5)
pH: 6 (ref 5.0–8.0)

## 2019-04-14 LAB — CBC
HCT: 32.6 % — ABNORMAL LOW (ref 39.0–52.0)
Hemoglobin: 10.6 g/dL — ABNORMAL LOW (ref 13.0–17.0)
MCH: 33.5 pg (ref 26.0–34.0)
MCHC: 32.5 g/dL (ref 30.0–36.0)
MCV: 103.2 fL — ABNORMAL HIGH (ref 80.0–100.0)
Platelets: 185 10*3/uL (ref 150–400)
RBC: 3.16 MIL/uL — ABNORMAL LOW (ref 4.22–5.81)
RDW: 16.9 % — ABNORMAL HIGH (ref 11.5–15.5)
WBC: 10.9 10*3/uL — ABNORMAL HIGH (ref 4.0–10.5)
nRBC: 0 % (ref 0.0–0.2)

## 2019-04-14 LAB — LIPASE, BLOOD: Lipase: 31 U/L (ref 11–51)

## 2019-04-14 LAB — COMPREHENSIVE METABOLIC PANEL
ALT: 21 U/L (ref 0–44)
AST: 32 U/L (ref 15–41)
Albumin: 4 g/dL (ref 3.5–5.0)
Alkaline Phosphatase: 77 U/L (ref 38–126)
Anion gap: 11 (ref 5–15)
BUN: 24 mg/dL — ABNORMAL HIGH (ref 8–23)
CO2: 27 mmol/L (ref 22–32)
Calcium: 9 mg/dL (ref 8.9–10.3)
Chloride: 91 mmol/L — ABNORMAL LOW (ref 98–111)
Creatinine, Ser: 1.44 mg/dL — ABNORMAL HIGH (ref 0.61–1.24)
GFR calc Af Amer: 58 mL/min — ABNORMAL LOW (ref >60)
GFR calc non Af Amer: 50 mL/min — ABNORMAL LOW (ref >60)
Glucose, Bld: 118 mg/dL — ABNORMAL HIGH (ref 70–99)
Potassium: 3.7 mmol/L (ref 3.5–5.1)
Sodium: 129 mmol/L — ABNORMAL LOW (ref 135–145)
Total Bilirubin: 2.1 mg/dL — ABNORMAL HIGH (ref 0.3–1.2)
Total Protein: 7.3 g/dL (ref 6.5–8.1)

## 2019-04-14 LAB — TROPONIN I (HIGH SENSITIVITY)
Troponin I (High Sensitivity): 47 ng/L — ABNORMAL HIGH (ref <18)
Troponin I (High Sensitivity): 44 ng/L — ABNORMAL HIGH (ref <18)

## 2019-04-14 LAB — LACTIC ACID, PLASMA: Lactic Acid, Venous: 1.4 mmol/L (ref 0.5–1.9)

## 2019-04-14 MED ORDER — IOHEXOL 300 MG/ML  SOLN
100.0000 mL | Freq: Once | INTRAMUSCULAR | Status: AC | PRN
  Administered 2019-04-14: 19:00:00 100 mL via INTRAVENOUS

## 2019-04-14 MED ORDER — ONDANSETRON HCL 4 MG/2ML IJ SOLN
4.0000 mg | Freq: Once | INTRAMUSCULAR | Status: AC
  Administered 2019-04-14: 19:00:00 4 mg via INTRAVENOUS
  Filled 2019-04-14: qty 2

## 2019-04-14 MED ORDER — FENTANYL CITRATE (PF) 100 MCG/2ML IJ SOLN
25.0000 ug | Freq: Once | INTRAMUSCULAR | Status: AC
  Administered 2019-04-14: 19:00:00 25 ug via INTRAVENOUS
  Filled 2019-04-14: qty 2

## 2019-04-14 NOTE — ED Provider Notes (Addendum)
[facility] Emergency Department Provider Note  ____________________________________________   First MD Initiated Contact with Patient 04/14/19 1831     (approximate)  I have reviewed the triage vital signs and the nursing notes.   HISTORY  Chief Complaint Abdominal Pain    HPI Carl [name] is a 68 y.o. male with ulcer disease, MI, CHF with AICD who presents with abdominal pain.  Patient endorses having pain that started 2 days ago.  The pain is in his entire abdomen, constant, nothing makes it worse, nothing makes it better.  The pain occasionally radiates from his abdomen up into his esophagus.  No overt chest pain.  Patient has had 1 episode of vomiting yesterday.  Patient has had some hard stool recently.  Does take an oral medication for constipation.          Past Medical History:  Diagnosis Date  . AICD (automatic cardioverter/defibrillator) present   . Anemia   . Anxiety   . Arthritis   . Brunner's gland hyperplasia of duodenum   . CHF (congestive heart failure) ([place])   . Coronary artery disease   . External hemorrhoids   . Fracture of femoral neck, left ([place]) 07/21/2018  . GERD (gastroesophageal reflux disease)   . Hearing loss   . HH (hiatus hernia)   . Internal hemorrhoids   . Ischemic cardiomyopathy   . Leg fracture, left   . Myocardial infarction ([place])   . Paronychia   . Pneumonia   . Psoriasis   . PUD (peptic ulcer disease)   . Schatzki's ring   . Tubular adenoma of colon   . Vitiligo     Patient Active Problem List   Diagnosis Date Noted  . HCAP (healthcare-associated pneumonia) 03/04/2019  . Hallucinations 02/10/2019  . Elevated blood sugar 01/27/2019  . S/P mitral valve repair 12/31/2018  . Bilateral pulmonary contusion 10/17/2018  . Cardiac LV ejection fraction of 20-34% 10/17/2018  . Chronic systolic CHF (congestive heart failure) ([place]) 10/17/2018  . History of ischemic cardiomyopathy 10/17/2018  . ICD  (implantable cardioverter-defibrillator) in place 10/17/2018  . S/p left hip fracture 10/17/2018  . Traumatic retroperitoneal hematoma 10/17/2018  . Age-related osteoporosis with current pathological fracture with routine healing 07/28/2018  . CAD (coronary artery disease) 07/21/2018  . GERD (gastroesophageal reflux disease) 07/21/2018  . Depression 07/21/2018  . CKD (chronic kidney disease), stage III ([place]) 07/21/2018  . Iron deficiency anemia 07/21/2018  . Hypokalemia 07/21/2018  . Closed left hip fracture ([place]) 07/21/2018  . Fracture of femoral neck, left ([place]) 07/21/2018  . Lower extremity pain, bilateral 11/12/2017  . 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 Hyperlipidemia 09/01/2017  . Essential hypertension 09/01/2017  . Atherosclerotic peripheral vascular disease with intermittent claudication ([place]) 09/01/2017  . Anemia, unspecified 08/06/2017  . GAD (generalized anxiety disorder) 06/18/2017  . Prostate cancer screening 04/28/2017  . Hydrocele 04/28/2017  . Bradycardia   . Premature ventricular contraction   . Severe mitral insufficiency   . Cardiomyopathy, ischemic 04/26/2015  . Difficulty hearing 12/29/2014  . Acute on chronic systolic CHF (congestive heart failure) ([place]) 10/10/2014  . 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 Protein-calorie malnutrition, severe ([place]) 07/29/2014  . AKI (acute kidney injury) ([place]) 07/29/2014    Past Surgical History:  Procedure Laterality Date  . ATRIAL SEPTAL DEFECT(ASD) CLOSURE N/A 12/30/2018   Procedure: ATRIAL SEPTAL DEFECT(ASD) CLOSURE;  Surgeon: [name], MD;  Location: [place] CV LAB;  Service: Cardiovascular;  Laterality: N/A;  .  CARDIAC SURGERY    . COLONOSCOPY WITH ESOPHAGOGASTRODUODENOSCOPY (EGD)    . COLONOSCOPY WITH PROPOFOL N/A 08/24/2017   Procedure: COLONOSCOPY WITH PROPOFOL;  Surgeon: [name], MD;  Location: [facility] ENDOSCOPY;  Service: Endoscopy;  Laterality: N/A;  . CORONARY ARTERY BYPASS GRAFT  N/A 08/03/2014   Procedure: CORONARY ARTERY BYPASS GRAFTING (CABG);  Surgeon: [name], MD;  Location: [place];  Service: Open Heart Surgery;  Laterality: N/A;  . EP IMPLANTABLE DEVICE N/A 05/01/2015   MDT ICD implanted for primary prevention of sudden death  . ESOPHAGOGASTRODUODENOSCOPY (EGD) WITH PROPOFOL N/A 04/16/2015   Procedure: ESOPHAGOGASTRODUODENOSCOPY (EGD) WITH PROPOFOL;  Surgeon: [name], MD;  Location: [facility] ENDOSCOPY;  Service: Endoscopy;  Laterality: N/A;  . ESOPHAGOGASTRODUODENOSCOPY (EGD) WITH PROPOFOL N/A 08/24/2017   Procedure: ESOPHAGOGASTRODUODENOSCOPY (EGD) WITH PROPOFOL;  Surgeon: [name], MD;  Location: [facility] ENDOSCOPY;  Service: Endoscopy;  Laterality: N/A;  . FRACTURE SURGERY    . HERNIA REPAIR    . HIP PINNING,CANNULATED Left 07/22/2018   Procedure: CANNULATED HIP PINNING;  Surgeon: [name], MD;  Location: [place];  Service: Orthopedics;  Laterality: Left;  . MITRAL VALVE REPAIR N/A 12/30/2018   Procedure: MITRAL VALVE REPAIR;  Surgeon: [name], MD;  Location: [place] CV LAB;  Service: Cardiovascular;  Laterality: N/A;  . RIGHT/LEFT HEART CATH AND CORONARY/GRAFT ANGIOGRAPHY N/A 12/21/2018   Procedure: RIGHT/LEFT HEART CATH AND CORONARY/GRAFT ANGIOGRAPHY;  Surgeon: [name], MD;  Location: [place] CV LAB;  Service: Cardiovascular;  Laterality: N/A;  . TEE WITHOUT CARDIOVERSION N/A 08/03/2014   Procedure: TRANSESOPHAGEAL ECHOCARDIOGRAM (TEE);  Surgeon: [name], MD;  Location: [place];  Service: Open Heart Surgery;  Laterality: N/A;  . TEE WITHOUT CARDIOVERSION N/A 02/10/2018   Procedure: TRANSESOPHAGEAL ECHOCARDIOGRAM (TEE);  Surgeon: [name], MD;  Location: [facility] ENDOSCOPY;  Service: Cardiovascular;  Laterality: N/A;    Prior to Admission medications   Medication Sig Start Date End Date Taking? Authorizing Provider  acetaminophen (TYLENOL) 325 MG tablet Take 325 mg by mouth every 6 (six) hours as needed for mild  pain or moderate pain.     [provider]  albuterol (PROVENTIL) (2.5 MG/3ML) 0.083% nebulizer solution Take 3 mLs by nebulization every 6 (six) hours as needed for wheezing or shortness of breath.  11/24/18   [provider]  allopurinol (ZYLOPRIM) 100 MG tablet Take 100 mg by mouth daily. 02/08/19   [provider]  Ascorbic Acid (VITAMIN C) 100 MG tablet Take 100 mg by mouth daily.    [provider]  aspirin EC 81 MG tablet Take 1 tablet (81 mg total) by mouth daily. 08/26/18   [name], MD  atorvastatin (LIPITOR) 40 MG tablet Take 1 tablet (40 mg total) by mouth daily at 6 PM. 01/10/19   [name], MD  carvedilol (COREG) 6.25 MG tablet Take 1 tablet (6.25 mg total) by mouth 2 (two) times daily. 03/22/19   [name], MD  Cholecalciferol (VITAMIN D3 SUPER STRENGTH) 50 MCG (2000 UT) TABS Take 2,000 Units by mouth daily.     [provider]  citalopram (CELEXA) 20 MG tablet Take 20 mg by mouth daily.  07/09/16   [provider]  clopidogrel (PLAVIX) 75 MG tablet Take 1 tablet (75 mg total) by mouth daily with breakfast. 03/23/19   [name], MD  cyanocobalamin 500 MCG tablet Take 500 mcg by mouth daily.    [provider]  docusate sodium (COLACE)  100 MG capsule Take 100 mg by mouth daily.    [provider]  DR SMITHS DIAPER 10 % OINT Apply 1 application topically daily as needed (IRRITATION).  09/01/18   [provider]  eplerenone (INSPRA) 50 MG tablet Take 1 tablet (50 mg total) by mouth daily. 01/12/19   [name], MD  ferrous sulfate 325 (65 FE) MG tablet Take 1 tablet (325 mg total) by mouth daily with breakfast. 02/09/18   [name], MD  ketoconazole (NIZORAL) 2 % shampoo Apply 1 application topically 2 (two) times a week.  09/28/18   [provider]  KLOR-CON M20 20 MEQ tablet TAKE 1 TAB BY MOUTH TWICE A DAY. TAKE 1 EXTRA TABLET ON WEDNESDAY MORNING W/ METOLAZONE 04/14/19    [name], MD  metolazone (ZAROXOLYN) 2.5 MG tablet TAKE 1 TAB BY MOUTH ONCE A WEEK. WEDNESDAY MORNINGS. TAKE 1 EXTRA POTASSIUM TABLET WITH THIS 02/14/19   [provider]  Multiple Vitamin (MULTIVITAMIN) tablet Take 1 tablet by mouth daily.    [provider]  pantoprazole (PROTONIX) 40 MG tablet Take 1 tablet (40 mg total) by mouth daily. 03/31/19   [name], MD  torsemide (DEMADEX) 20 MG tablet Take 80mg  (4 tablets) in the AM and 40mg  (2 tablets) in the PM. Patient taking differently: Take 40-80 mg by mouth 2 (two) times daily. Take 80mg  (4 tablets) in the AM and 40mg  (2 tablets) in the PM. 01/18/19   [name], MD    Allergies Spironolactone  Family History  Problem Relation Age of Onset  . Valvular heart disease Mother        Ruptured valve  . CAD Father   . Heart Problems Brother        Stents x 4  . Diabetes Brother   . Prostate cancer Neg Hx   . Bladder Cancer Neg Hx   . Kidney cancer Neg Hx     Social History Social History   Tobacco Use  . Smoking status: Former Smoker    Quit date: 2014    Years since quitting: 6.6  . Smokeless tobacco: Never Used  . Tobacco comment: 07/30/2017 Quit in 2014  Substance Use Topics  . Alcohol use: No    Alcohol/week: 0.0 standard drinks  . Drug use: No      Review of Systems Constitutional: No fever/chills Eyes: No visual changes. ENT: No sore throat. Cardiovascular: Denies chest pain. Respiratory: Denies shortness of breath. Gastrointestinal: Abdominal pain and one episode of vomiting and constipation Genitourinary: Negative for dysuria. Musculoskeletal: Negative for back pain. Skin: Negative for rash. Neurological: Negative for headaches, focal weakness or numbness. All other ROS negative ____________________________________________   PHYSICAL EXAM:  VITAL SIGNS: ED Triage Vitals  Enc Vitals Group     BP 04/14/19 1615 100/63     Pulse Rate 04/14/19 1615 89     Resp 04/14/19  1615 16     Temp 04/14/19 1615 99.2 F (37.3 C)     Temp Source 04/14/19 1615 Oral     SpO2 04/14/19 1615 96 %     Weight 04/14/19 1616 150 lb (68 kg)     Height 04/14/19 1616 6' (1.829 m)     Head Circumference --      Peak Flow --      Pain Score 04/14/19 1616 4     Pain Loc --      Pain Edu? --      Excl. in [place]? --  Constitutional: Alert and oriented. Well appearing and in no acute distress. Eyes: Conjunctivae are normal. EOMI. Head: Atraumatic. Nose: No congestion/rhinnorhea. Mouth/Throat: Mucous membranes are moist.   Neck: No stridor. Trachea Midline. FROM Cardiovascular: Normal rate, regular rhythm. Grossly normal heart sounds.  Good peripheral circulation. Respiratory: Normal respiratory effort.  No retractions. Lungs CTAB. Gastrointestinal: Slightly tender abdomen.  No rebound or guarding no distention. No abdominal bruits.  Musculoskeletal: No lower extremity tenderness nor edema.  No joint effusions. Neurologic:  Normal speech and language. No gross focal neurologic deficits are appreciated.  Skin:  Skin is warm, dry and intact.  Some hypopigmentation at baseline for patient Psychiatric: Mood and affect are normal. Speech and behavior are normal. GU: Deferred   ____________________________________________   LABS (all labs ordered are listed, but only abnormal results are displayed)  Labs Reviewed  COMPREHENSIVE METABOLIC PANEL - Abnormal; Notable for the following components:      Result Value   Sodium 129 (*)    Chloride 91 (*)    Glucose, Bld 118 (*)    BUN 24 (*)    Creatinine, Ser 1.44 (*)    Total Bilirubin 2.1 (*)    GFR calc non Af Amer 50 (*)    GFR calc Af Amer 58 (*)    All other components within normal limits  CBC - Abnormal; Notable for the following components:   WBC 10.9 (*)    RBC 3.16 (*)    Hemoglobin 10.6 (*)    HCT 32.6 (*)    MCV 103.2 (*)    RDW 16.9 (*)    All other components within normal limits  URINALYSIS, COMPLETE  (UACMP) WITH MICROSCOPIC - Abnormal; Notable for the following components:   Color, Urine YELLOW (*)    APPearance CLEAR (*)    Bacteria, UA RARE (*)    All other components within normal limits  TROPONIN I (HIGH SENSITIVITY) - Abnormal; Notable for the following components:   Troponin I (High Sensitivity) 47 (*)    All other components within normal limits  TROPONIN I (HIGH SENSITIVITY) - Abnormal; Notable for the following components:   Troponin I (High Sensitivity) 44 (*)    All other components within normal limits  LIPASE, BLOOD  LACTIC ACID, PLASMA   ____________________________________________   ED ECG REPORT I, Vanessa Vici, the attending physician, personally viewed and interpreted this ECG.  EKG is right bundle branch block rate of 97 no ST elevation flipped T waves in V2 through V3 and aVL.  This looks similar to prior EKG ____________________________________________  RADIOLOGY I, Vanessa Inniswold, personally viewed and evaluated these images (plain radiographs) as part of my medical decision making, as well as reviewing the written report by the radiologist.   Official radiology report(s): Ct Abdomen Pelvis W Contrast  Result Date: 04/14/2019 CLINICAL DATA:  68 year old male with acute abdominal and pelvic pain with nausea and vomiting. EXAM: CT ABDOMEN AND PELVIS WITH CONTRAST TECHNIQUE: Multidetector CT imaging of the abdomen and pelvis was performed using the standard protocol following bolus administration of intravenous contrast. CONTRAST:  183mL OMNIPAQUE IOHEXOL 300 MG/ML  SOLN COMPARISON:  03/03/2019 and prior CTs FINDINGS: Lower chest: Marked cardiomegaly and bibasilar ground-glass/interstitial opacities again noted. No acute abnormality. Hepatobiliary: Cirrhosis again identified. No focal hepatic lesions are noted. The gallbladder is unremarkable. No biliary dilatation. Pancreas: Unremarkable Spleen: Unremarkable Adrenals/Urinary Tract: The kidneys, adrenal glands and  bladder are unremarkable. Stomach/Bowel: Stomach is within normal limits. Appendix appears normal. No evidence of  bowel wall thickening, distention, or inflammatory changes. Vascular/Lymphatic: Aortic atherosclerosis. No enlarged abdominal or pelvic lymph nodes. Reproductive: Prostate is unremarkable. Other: A small amount of ascites within the pelvis is noted. No focal abscess or pneumoperitoneum. Musculoskeletal: No acute or suspicious bony abnormalities. Surgical screws within the proximal LEFT femur and remote LEFT sacral fractures again identified. IMPRESSION: 1. No acute abnormality. 2. Cirrhosis with small amount of pelvic ascites. 3.  Aortic Atherosclerosis (ICD10-I70.0). Electronically Signed   By: Margarette Canada M.D.   On: 04/14/2019 19:40    ____________________________________________   PROCEDURES  Procedure(s) performed (including Critical Care):  Procedures   ____________________________________________   INITIAL IMPRESSION / ASSESSMENT AND PLAN / ED COURSE  Carl [name] was evaluated in Emergency Department on 04/14/2019 for the symptoms described in the history of present illness. He was evaluated in the context of the global COVID-19 pandemic, which necessitated consideration that the patient might be at risk for infection with the SARS-CoV-2 virus that causes COVID-19. Institutional protocols and algorithms that pertain to the evaluation of patients at risk for COVID-19 are in a state of rapid change based on information released by regulatory bodies including the CDC and federal and state organizations. These policies and algorithms were followed during the patient's care in the ED.     Patient is a 68 year old who presents with abdominal pain.  Will get CT scan to evaluate for SBO, appendicitis.  Suspicion for pancreatitis or cholecystitis but will get labs to evaluate.  This could be secondary to constipation.  Also consider mesenteric ischemia will add on lactate.  Given  patient's heart history will get cardiac markers to make sure this is not an atypical presentation of ACS.  Lipase is normal therefore low suspicion for pancreatitis.  Sodium is slightly low at 129.  Creatinine is at baseline at 1.44.  White count is elevated at 10.9.  Hemoglobin is around baseline.  Discussed with patient's family the sodium was slightly low at 129 but that is been around where he sets with sodiums between 127 and 131.  We will hold off on fluids given his heart failure and his kidney function is at baseline and his urine does not show signs of severe dehydration.  Will patient follow-up with his cardiologist or primary care doctor for recheck within the week.  CT scan was negative for acute processes.  Lactate is stable so low suspicion for mesenteric ischemia.  Troponins are at baseline and delta of 3 therefore low suspicion of ACS given his description of pain  Patient CT does show cirrhosis with some very small pelvic ascites.  I discussed with family.  They are unclear if he has had this diagnosis in the past.  However his prior CT scans have shown a cirrhotic liver previously.  I reviewed images personally I do not see ascites that seem amenable to being tapped.  Patient's abdominal pain is now resolved.  I have low suspicion for SBP at this time given patient is afebrile and has no abdominal pain.  However discussed with family follow-up with GI if they have not yet for his cirrhosis and to return to the ER if he develops fevers, worsening pain or any other concerns.  Patient will start MiraLAX for his hard stools that is mostly the culprit of his pain.  He continues look well and is tolerating p.o. and feels comfortable with discharge home.   ____________________________________________   FINAL CLINICAL IMPRESSION(S) / ED DIAGNOSES   Final diagnoses:  Abdominal pain,  unspecified abdominal location  Constipation, unspecified constipation type      MEDICATIONS  GIVEN DURING THIS VISIT:  Medications  ondansetron (ZOFRAN) injection 4 mg (4 mg Intravenous Given 04/14/19 1908)  fentaNYL (SUBLIMAZE) injection 25 mcg (25 mcg Intravenous Given 04/14/19 1908)  iohexol (OMNIPAQUE) 300 MG/ML solution 100 mL (100 mLs Intravenous Contrast Given 04/14/19 1921)     ED Discharge Orders    None       Note:  This document was prepared using Dragon voice recognition software and may include unintentional dictation errors.   Vanessa Proctor, MD 04/14/19 2126    Vanessa Worthington, MD 04/14/19 2127

## 2019-04-14 NOTE — ED Triage Notes (Signed)
Pt in via POV, sent over from [facility], complaints of generalized abdominal pain w/ N/V since last night, denies diarrhea.  lowgrade fever upon arrival.  NAD noted at this time.

## 2019-04-14 NOTE — Discharge Instructions (Addendum)
His work-up was reassuring.  You should start taking MiraLAX over-the-counter.  Take 1 cap a day until he starts having soft stools.  The CT scan does show some cirrhosis with a small amount of ascites.  I have low suspicion that these are infected however if he develops fevers over 100, vomiting, worsening abdominal pain he should be reevaluated.  We will give you GIs number to follow-up with.  He sodium level was slightly low at 129 although it seems like he fluctuates between 127-131.  Should have this rechecked within a week with his primary doctor cardiology doctor..  IMPRESSION: 1. No acute abnormality. 2. Cirrhosis with small amount of pelvic ascites. 3.  Aortic Atherosclerosis (ICD10-I70.0).

## 2019-04-15 ENCOUNTER — Encounter (HOSPITAL_COMMUNITY): Payer: Self-pay

## 2019-04-15 NOTE — Progress Notes (Signed)
No ICM remote transmission received for 04/11/2019 and next ICM transmission scheduled for 04/25/2019.

## 2019-04-15 NOTE — Progress Notes (Signed)
Today had a visit with Carl [name] at his home.  His complaint is his legs hurt and his stool is still hard at times.  He states been drinking plenty of water.  He states eating good.  Meds verified and med box refilled.  He has been taking stool softners.  He denies chest pain, shortness of breath, headaches or dizziness.  He states his abdomen does not feel full.  Swelling is down in extremities and lungs are clear.  He states he walked through [place] with his bedroom slippers on, that is when his legs hurt worst. Discussed with him about wearing socks and his shoes.  Will continue to visit for heart failure.   [place] [phone]

## 2019-04-25 ENCOUNTER — Ambulatory Visit (INDEPENDENT_AMBULATORY_CARE_PROVIDER_SITE_OTHER): Payer: Medicare Other

## 2019-04-25 ENCOUNTER — Encounter (HOSPITAL_COMMUNITY): Payer: Self-pay

## 2019-04-25 ENCOUNTER — Other Ambulatory Visit (HOSPITAL_COMMUNITY): Payer: Self-pay

## 2019-04-25 DIAGNOSIS — Z95810 Presence of automatic (implantable) cardiac defibrillator: Secondary | ICD-10-CM | POA: Diagnosis not present

## 2019-04-25 DIAGNOSIS — I5022 Chronic systolic (congestive) heart failure: Secondary | ICD-10-CM

## 2019-04-25 NOTE — Progress Notes (Signed)
Carl [name] had seen his PCP last week and had some med changes.  Went to his home today to make sure hismed boxes were correct.  He is now on gabapentin 300 mg at night and cymbalta 30 mg at night.  He has been took off celexa. His brother Carl [name] had switched his meds around and they are right.  Will come back later in the week to do med boxes up for 2 weeks and do a full visit.  Carl [name] today feels ok, has no swelling but still complaining of legs and ankle hurting.  He does states sleeping better. He denies chest pain, shortness of breath, headaches or dizziness.  He states they have called about his reading on his machine, he states he resent it.  He states his machine showed complete.   [place] [phone]

## 2019-04-26 NOTE — Progress Notes (Signed)
EPIC Encounter for ICM Monitoring  Patient Name: Carl [name] is a 68 y.o. male Date: 04/26/2019 Primary Care Physican: [name], MD Primary Cardiologist:[place] Electrophysiologist: [place] 03/07/2019 Weight: 149 lbs    Spoke with brother [name]. He reports patient is doing fine.    OptivolThoracic impedancenormal.  Prescribed:Torsemide20mg take4tablets(80 mg total)every morning and 40 mg every evening.  Labs: 04/14/2019 Creatinine 1.44, BUN 24, Potassium 3.7, Sodium 139, GFR 50-58 03/22/2019 Creatinine 1.45, BUN 35, Potassium 4.0, Sodium 131, GFR 49-57 03/04/2019 Creatinine1.39, BUN24, Potassium3.3, Sodium135, GFR52->60 03/03/2019 Creatinine1.37, BUN24, Potassium3.4, Sodium131, GFR53->60  02/09/2019 Creatinine1.75, BUN28, Potassium4.3, [id number], [id number]  A complete set of results can be found in Results Review  Recommendations: No changes and encouraged to call if experiencing any fluid symptoms.  Follow-up plan: ICM clinic phone appointment on 06/13/2019.   91 day device clinic remote transmission due 07/06/2019.  Office appt 05/24/2019 with Dr. [name].    Copy of ICM check sent to Dr. Rayann Heman.   3 month ICM trend: 04/25/2019    1 Year ICM trend:       Rosalene Billings, RN 04/26/2019 1:16 PM

## 2019-04-26 NOTE — Progress Notes (Signed)
Went to [facility] to fix his medications in his med box.   [place] [phone]

## 2019-04-28 ENCOUNTER — Other Ambulatory Visit (HOSPITAL_COMMUNITY): Payer: Self-pay | Admitting: *Deleted

## 2019-04-28 ENCOUNTER — Other Ambulatory Visit (HOSPITAL_COMMUNITY): Payer: Self-pay

## 2019-04-28 ENCOUNTER — Ambulatory Visit: Payer: Medicare Other | Admitting: Podiatry

## 2019-04-28 ENCOUNTER — Encounter (HOSPITAL_COMMUNITY): Payer: Self-pay

## 2019-04-28 MED ORDER — TORSEMIDE 20 MG PO TABS: ORAL_TABLET | ORAL | 6 refills | Status: DC

## 2019-04-28 NOTE — Progress Notes (Signed)
Today was a home visit with Carl [name].  He appears to be feeling good today, he looks good.  He states his feet are hurting a little but are better.  He is up and ambulating good.  He appears to be more his self, where he has not been in awhile.  He appears to be taking his medications per the med boxes.  Meds verified and med boxes refilled. Denies chest pain, shortness of breath, headaches or dizziness.  He has no swelling in extremities and abdomen does not feel tight.  He has gained 4 lbs, will notify HF clinic.  Lungs are clear and speaking without being winded.  His family brings him food, not sure what he has been eating or if he is taking time to weigh right.  Spoke with [facility] yesterday and they are working on getting him a new scale and tablet to report his weights to them daily. He needs refills on allupurinol, torsemide and senakot.  Called in refills to [facility] pharmacy.  Will continue to visit for heart failure, diet and medication compliance.   [place] [phone]

## 2019-05-10 ENCOUNTER — Other Ambulatory Visit (HOSPITAL_COMMUNITY): Payer: Self-pay

## 2019-05-10 ENCOUNTER — Encounter (HOSPITAL_COMMUNITY): Payer: Self-pay

## 2019-05-10 NOTE — Progress Notes (Signed)
Today was a visit with Carl [name] at home.  He states his legs are still hurting.   He has no swelling in extremities and abdomen does not feel tight.  He missed a couple of his medications last week, he is not sure what happened.  Meds verified nad med boxes refilled.  Called in refills for allupurinol and torsemide.   He denies chest pain, shortness of breath, dizziness or headaches.  He stays active going shopping with his sister.  He props his feet up when sitting.  Noticed in kitchen he has several high sodium snacks, he states his sister said they were ok, looked at sodium content with him and went over how much sodium they have in them.  He is drinking water and not sodas.  He states feeling ok.  Went over how he should take his medications and he appears to understand.  Will continue to visit for heart failure.   [place] [phone]

## 2019-05-12 DIAGNOSIS — K7460 Unspecified cirrhosis of liver: Secondary | ICD-10-CM | POA: Insufficient documentation

## 2019-05-12 DIAGNOSIS — R195 Other fecal abnormalities: Secondary | ICD-10-CM | POA: Insufficient documentation

## 2019-05-18 DIAGNOSIS — Z86010 Personal history of colonic polyps: Secondary | ICD-10-CM | POA: Insufficient documentation

## 2019-05-23 ENCOUNTER — Telehealth (HOSPITAL_COMMUNITY): Payer: Self-pay

## 2019-05-23 ENCOUNTER — Encounter (HOSPITAL_COMMUNITY): Payer: Medicare Other | Admitting: Cardiology

## 2019-05-23 NOTE — Telephone Encounter (Signed)
Pre op clearance faxed to [facility]ology Department.  Confirmation received.

## 2019-05-24 ENCOUNTER — Encounter (HOSPITAL_COMMUNITY): Payer: Self-pay | Admitting: Cardiology

## 2019-05-24 ENCOUNTER — Telehealth (HOSPITAL_COMMUNITY): Payer: Self-pay | Admitting: *Deleted

## 2019-05-24 ENCOUNTER — Ambulatory Visit (HOSPITAL_COMMUNITY)
Admission: RE | Admit: 2019-05-24 | Discharge: 2019-05-24 | Disposition: A | Discharge status: Home / Self Care | Payer: Medicare Other | Source: Ambulatory Visit | Attending: Cardiology | Admitting: Cardiology

## 2019-05-24 ENCOUNTER — Other Ambulatory Visit: Payer: Self-pay

## 2019-05-24 VITALS — BP 98/58 | HR 77 | Wt 164.0 lb

## 2019-05-24 DIAGNOSIS — K7460 Unspecified cirrhosis of liver: Secondary | ICD-10-CM | POA: Diagnosis not present

## 2019-05-24 DIAGNOSIS — Z7902 Long term (current) use of antithrombotics/antiplatelets: Secondary | ICD-10-CM | POA: Diagnosis not present

## 2019-05-24 DIAGNOSIS — Z87891 Personal history of nicotine dependence: Secondary | ICD-10-CM | POA: Insufficient documentation

## 2019-05-24 DIAGNOSIS — I252 Old myocardial infarction: Secondary | ICD-10-CM | POA: Insufficient documentation

## 2019-05-24 DIAGNOSIS — I255 Ischemic cardiomyopathy: Secondary | ICD-10-CM | POA: Diagnosis not present

## 2019-05-24 DIAGNOSIS — I2510 Atherosclerotic heart disease of native coronary artery without angina pectoris: Secondary | ICD-10-CM | POA: Insufficient documentation

## 2019-05-24 DIAGNOSIS — Z951 Presence of aortocoronary bypass graft: Secondary | ICD-10-CM | POA: Diagnosis not present

## 2019-05-24 DIAGNOSIS — R0602 Shortness of breath: Secondary | ICD-10-CM | POA: Diagnosis present

## 2019-05-24 DIAGNOSIS — F70 Mild intellectual disabilities: Secondary | ICD-10-CM | POA: Insufficient documentation

## 2019-05-24 DIAGNOSIS — Z8249 Family history of ischemic heart disease and other diseases of the circulatory system: Secondary | ICD-10-CM | POA: Diagnosis not present

## 2019-05-24 DIAGNOSIS — F329 Major depressive disorder, single episode, unspecified: Secondary | ICD-10-CM | POA: Insufficient documentation

## 2019-05-24 DIAGNOSIS — Z7982 Long term (current) use of aspirin: Secondary | ICD-10-CM | POA: Insufficient documentation

## 2019-05-24 DIAGNOSIS — Z79899 Other long term (current) drug therapy: Secondary | ICD-10-CM | POA: Diagnosis not present

## 2019-05-24 DIAGNOSIS — M549 Dorsalgia, unspecified: Secondary | ICD-10-CM | POA: Diagnosis not present

## 2019-05-24 DIAGNOSIS — N183 Chronic kidney disease, stage 3 (moderate): Secondary | ICD-10-CM | POA: Diagnosis not present

## 2019-05-24 DIAGNOSIS — Q211 Atrial septal defect: Secondary | ICD-10-CM | POA: Insufficient documentation

## 2019-05-24 DIAGNOSIS — I340 Nonrheumatic mitral (valve) insufficiency: Secondary | ICD-10-CM

## 2019-05-24 DIAGNOSIS — I5022 Chronic systolic (congestive) heart failure: Secondary | ICD-10-CM | POA: Diagnosis not present

## 2019-05-24 LAB — BASIC METABOLIC PANEL
Anion gap: 10 (ref 5–15)
BUN: 20 mg/dL (ref 8–23)
CO2: 26 mmol/L (ref 22–32)
Calcium: 8.7 mg/dL — ABNORMAL LOW (ref 8.9–10.3)
Chloride: 92 mmol/L — ABNORMAL LOW (ref 98–111)
Creatinine, Ser: 1.47 mg/dL — ABNORMAL HIGH (ref 0.61–1.24)
GFR calc Af Amer: 56 mL/min — ABNORMAL LOW (ref >60)
GFR calc non Af Amer: 49 mL/min — ABNORMAL LOW (ref >60)
Glucose, Bld: 95 mg/dL (ref 70–99)
Potassium: 3.5 mmol/L (ref 3.5–5.1)
Sodium: 128 mmol/L — ABNORMAL LOW (ref 135–145)

## 2019-05-24 MED ORDER — POTASSIUM CHLORIDE CRYS ER 20 MEQ PO TBCR: 60.0000 meq | EXTENDED_RELEASE_TABLET | Freq: Every day | ORAL | 5 refills | Status: DC

## 2019-05-24 MED ORDER — POTASSIUM CHLORIDE CRYS ER 20 MEQ PO TBCR: 20.0000 meq | EXTENDED_RELEASE_TABLET | Freq: Every day | ORAL | 5 refills | Status: DC

## 2019-05-24 MED ORDER — TORSEMIDE 20 MG PO TABS: 80.0000 mg | ORAL_TABLET | Freq: Two times a day (BID) | ORAL | 5 refills | Status: DC

## 2019-05-24 MED ORDER — DIGOXIN 125 MCG PO TABS: 125.0000 ug | ORAL_TABLET | Freq: Every day | ORAL | 11 refills | Status: DC

## 2019-05-24 NOTE — Telephone Encounter (Signed)
Kristi called back and reported patient takes potassium 37meq bid. Per [facility] increase daily dose by 46meq. Kristi aware.

## 2019-05-24 NOTE — Telephone Encounter (Signed)
Left Kristi w/paramedicine a message that patient had an office visit today and medication changes were made. I told her she could look at his AVS or give [place] a call for more information.

## 2019-05-24 NOTE — Progress Notes (Signed)
CSW consulted to speak with pt and brother regarding HCPOA.  Patient brother had been sent HCPOA paperwork by pt insurance provider and had been filling it out but had some questions- wanted to know how to make it official and how to inform healthcare agencies about the [place].  CSW explained that HCPOA paperwork needed to be signed by patient in front of a notary in order to be official- suggested checking with his bank to see if there is a free notary available.  CSW also explained that after this is signed he would need to hold onto the paperwork and bring it with him to medical appts and admissions to prove he is HCPOA otherwise decisions would revert to next of kin which would be patients daughter.  Patient also inquired about meal service that could come out and cook healthy meals for patient in his home.  CSW unaware of any in person services but provided with numbers for meal delivery programs that offer heart healthy options.  CSW will continue to follow and assist as needed  Jorge Ny, [facility] Desk#: [phone] Cell#: [phone]

## 2019-05-24 NOTE — Patient Instructions (Addendum)
Labs done today. We will contact you only if your labs are abnormal.  START Digoxin 0.125mg (1 tab) by mouth once daily  START Potassium 20mg (1 tab) by mouth once daily.  INCREASE Torsemide 80mg (4 tabs) by mouth 2 times daily.  Your physician recommends that you schedule a follow-up appointment in: 10days for a lab only appointment and in 3 weeks with Amy Clegg,NP(here at the [facility] Clinic)   At the [facility], you and your health needs are our priority. As part of our continuing mission to provide you with exceptional heart care, we have created designated Provider Care Teams. These Care Teams include your primary Cardiologist (physician) and Advanced Practice Providers (APPs- Physician Assistants and Nurse Practitioners) who all work together to provide you with the care you need, when you need it.   You may see any of the following providers on your designated Care Team at your next follow up: Marland Kitchen Dr [name] . Dr [name] . Darrick Grinder, NP   Please be sure to bring in all your medications bottles to every appointment.

## 2019-05-24 NOTE — Addendum Note (Signed)
Addended by: Harvie Junior on: 05/24/2019 04:13 PM   Modules accepted: Orders

## 2019-05-25 ENCOUNTER — Telehealth (HOSPITAL_COMMUNITY): Payer: Self-pay

## 2019-05-25 ENCOUNTER — Other Ambulatory Visit (HOSPITAL_COMMUNITY): Payer: Self-pay

## 2019-05-25 ENCOUNTER — Encounter (HOSPITAL_COMMUNITY): Payer: Self-pay

## 2019-05-25 NOTE — Progress Notes (Signed)
Today was a home visit with Carl [name].  He had HF clinic appt yesterday and has some med changes.  Verified his meds and filled med boxes. He is also taking Gabapentin 300 mg at night.  He states feels ok today except his feet still hurt.  He has some swelling in ankle area and feet.  His legs has no swelling in them.  He denies chest pain, shortness of breath, headaches or dizziness.  He has eaten hard boil egg for breakfast.  We discussed high sodium foods, such has fried fish, fried okra and high sodium snacks.  His sister takes him out to eat and brings him high sodium foods.  His brother Carl [name] is going to talk with her.  I also sent info to an agency that might help [name] out cooking and preparing meals for him, his brother Carl [name] was looking for someone.  Carl [name] is ambulating good, he states been riding the stationary bike some.  He states wold like to go out and walk his dog but where he lives now he does not like to walk around the neighborhood.  His medications a barely fitting in his medication boxes, will switch out med boxes next visit.  Will continue to visit for heart failure, diet and medication compliance.   [place] [phone]

## 2019-05-25 NOTE — Telephone Encounter (Signed)
Spoke with brother [name] (care taker).  Aware of lab results Na 128. Advised to cut back on po fluid intake. Verbalized understanding.

## 2019-05-25 NOTE — Telephone Encounter (Signed)
-----   Message from [name], MD sent at 05/24/2019  4:22 PM EDT ----- Sodium is low, cut back on po fluid intake.

## 2019-05-26 ENCOUNTER — Telehealth (HOSPITAL_COMMUNITY): Payer: Self-pay | Admitting: Licensed Clinical Social Worker
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
## 2019-05-26 NOTE — Telephone Encounter (Signed)
CSW following up with pt brother regarding getting additional assistance for patient at home.  CSW explained that since pt has Medicaid he could be eligible for [place] which could include someone coming out and helping to prepare meals for pt.  CSW emailed patient brother application form to take to pt PCP office to be filled out.  CSW will continue to follow and assist as needed  Jorge Ny, [facility] Desk#: [phone] Cell#: [phone]

## 2019-05-29 ENCOUNTER — Other Ambulatory Visit (HOSPITAL_COMMUNITY): Payer: Self-pay | Admitting: Adult Health

## 2019-06-03 ENCOUNTER — Other Ambulatory Visit: Payer: Self-pay | Admitting: Orthopedic Surgery

## 2019-06-03 ENCOUNTER — Inpatient Hospital Stay: Payer: Medicare Other

## 2019-06-03 ENCOUNTER — Other Ambulatory Visit: Payer: Self-pay

## 2019-06-03 ENCOUNTER — Inpatient Hospital Stay: Payer: Medicare Other | Attending: Oncology | Admitting: Oncology

## 2019-06-03 VITALS — BP 103/61 | HR 73 | Temp 98.9°F | Resp 16 | Wt 160.4 lb

## 2019-06-03 DIAGNOSIS — I5022 Chronic systolic (congestive) heart failure: Secondary | ICD-10-CM | POA: Insufficient documentation

## 2019-06-03 DIAGNOSIS — G8929 Other chronic pain: Secondary | ICD-10-CM

## 2019-06-03 DIAGNOSIS — D539 Nutritional anemia, unspecified: Secondary | ICD-10-CM

## 2019-06-03 DIAGNOSIS — N189 Chronic kidney disease, unspecified: Secondary | ICD-10-CM | POA: Insufficient documentation

## 2019-06-03 DIAGNOSIS — D509 Iron deficiency anemia, unspecified: Secondary | ICD-10-CM | POA: Diagnosis present

## 2019-06-03 DIAGNOSIS — M545 Low back pain, unspecified: Secondary | ICD-10-CM

## 2019-06-03 LAB — IRON AND TIBC
Iron: 128 ug/dL (ref 45–182)
Saturation Ratios: 26 % (ref 17.9–39.5)
TIBC: 501 ug/dL — ABNORMAL HIGH (ref 250–450)
UIBC: 373 ug/dL

## 2019-06-03 LAB — CBC WITH DIFFERENTIAL/PLATELET
Abs Immature Granulocytes: 0.06 10*3/uL (ref 0.00–0.07)
Basophils Absolute: 0.1 10*3/uL (ref 0.0–0.1)
Basophils Relative: 1 %
Eosinophils Absolute: 0.2 10*3/uL (ref 0.0–0.5)
Eosinophils Relative: 2 %
HCT: 29.5 % — ABNORMAL LOW (ref 39.0–52.0)
Hemoglobin: 9.8 g/dL — ABNORMAL LOW (ref 13.0–17.0)
Immature Granulocytes: 1 %
Lymphocytes Relative: 11 %
Lymphs Abs: 0.8 10*3/uL (ref 0.7–4.0)
MCH: 33.1 pg (ref 26.0–34.0)
MCHC: 33.2 g/dL (ref 30.0–36.0)
MCV: 99.7 fL (ref 80.0–100.0)
Monocytes Absolute: 0.8 10*3/uL (ref 0.1–1.0)
Monocytes Relative: 11 %
Neutro Abs: 5.3 10*3/uL (ref 1.7–7.7)
Neutrophils Relative %: 74 %
Platelets: 250 10*3/uL (ref 150–400)
RBC: 2.96 MIL/uL — ABNORMAL LOW (ref 4.22–5.81)
RDW: 14.6 % (ref 11.5–15.5)
WBC: 7.1 10*3/uL (ref 4.0–10.5)
nRBC: 0 % (ref 0.0–0.2)

## 2019-06-03 LAB — RETIC PANEL
Immature Retic Fract: 21.8 % — ABNORMAL HIGH (ref 2.3–15.9)
RBC.: 2.96 MIL/uL — ABNORMAL LOW (ref 4.22–5.81)
Retic Count, Absolute: 136.2 10*3/uL (ref 19.0–186.0)
Retic Ct Pct: 4.6 % — ABNORMAL HIGH (ref 0.4–3.1)
Reticulocyte Hemoglobin: 34.6 pg (ref >27.9)

## 2019-06-03 LAB — FERRITIN: Ferritin: 15 ng/mL — ABNORMAL LOW (ref 24–336)

## 2019-06-03 LAB — FOLATE: Folate: 37 ng/mL (ref >5.9)

## 2019-06-03 LAB — VITAMIN B12: Vitamin B-12: 890 pg/mL (ref 180–914)

## 2019-06-03 NOTE — Progress Notes (Signed)
Patient was last seen by Dr. Grayland Ormond in 2016 with no further f/u needed at that time.

## 2019-06-04 ENCOUNTER — Encounter: Payer: Self-pay | Admitting: Oncology

## 2019-06-04 ENCOUNTER — Other Ambulatory Visit: Payer: Self-pay | Admitting: Cardiology

## 2019-06-04 NOTE — Progress Notes (Signed)
Hematology/Oncology Consult note [facility] [phone] [phone]   Patient Care Team: [name], MD as PCP - General (Internal Medicine)  REFERRING PROVIDER: Lavera Guise, PA*  CHIEF COMPLAINTS/REASON FOR VISIT:  Evaluation of anemia  HISTORY OF PRESENTING ILLNESS:  Carl [name] is a  68 y.o.  male with PMH listed below who was referred to me for evaluation of anemia Reviewed patient's recent labs that was done.  05/25/2019 Labs revealed anemia with hemoglobin of 8.8 Reviewed patient's previous labs  anemia is chronic onset , duration is since 2016 No aggravating or improving factors.  Associated signs and symptoms: Patient reports fatigue. denies SOB with exertion. He is a poor historian Denies weight loss, easy bruising, hematochezia, hemoptysis, hematuria. Context: History of GI bleeding: denies               History of Chronic kidney disease                               Last colonoscopy: 2018 colonoscopy showed one small polyp in the sigmoid colon, removed with a hot snare. Resected and retrieved. bleeding colonic angioectasia. Treated with argon plasma coagulation (APC). -EGD 2018 showed esophagitis.   Lives by himself. Family sister/brother bring him food.   Review of Systems  Constitutional: Positive for fatigue. Negative for appetite change, chills and fever.  HENT:   Negative for hearing loss and voice change.   Eyes: Negative for eye problems.  Respiratory: Negative for chest tightness and cough.   Cardiovascular: Negative for chest pain.  Gastrointestinal: Negative for abdominal distention, abdominal pain and blood in stool.  Endocrine: Negative for hot flashes.  Genitourinary: Negative for difficulty urinating and frequency.   Musculoskeletal: Negative for arthralgias.  Skin: Negative for itching and rash.  Neurological: Negative for extremity weakness.  Hematological: Negative for adenopathy.   Psychiatric/Behavioral: Negative for confusion.     MEDICAL HISTORY:  Past Medical History:  Diagnosis Date  . AICD (automatic cardioverter/defibrillator) present   . Anemia   . Anxiety   . Arthritis   . Brunner's gland hyperplasia of duodenum   . CHF (congestive heart failure) ([place])   . Coronary artery disease   . External hemorrhoids   . Fracture of femoral neck, left ([place]) 07/21/2018  . GERD (gastroesophageal reflux disease)   . Hearing loss   . HH (hiatus hernia)   . Internal hemorrhoids   . Ischemic cardiomyopathy   . Leg fracture, left   . Myocardial infarction ([place])   . Paronychia   . Pneumonia   . Psoriasis   . PUD (peptic ulcer disease)   . Schatzki's ring   . Tubular adenoma of colon   . Vitiligo     SURGICAL HISTORY: Past Surgical History:  Procedure Laterality Date  . ATRIAL SEPTAL DEFECT(ASD) CLOSURE N/A 12/30/2018   Procedure: ATRIAL SEPTAL DEFECT(ASD) CLOSURE;  Surgeon: [name], MD;  Location: [facility];  Service: Cardiovascular;  Laterality: N/A;  . CARDIAC SURGERY    . COLONOSCOPY WITH ESOPHAGOGASTRODUODENOSCOPY (EGD)    . COLONOSCOPY WITH PROPOFOL N/A 08/24/2017   Procedure: COLONOSCOPY WITH PROPOFOL;  Surgeon: [name], MD;  Location: [facility] ENDOSCOPY;  Service: Endoscopy;  Laterality: N/A;  . CORONARY ARTERY BYPASS GRAFT N/A 08/03/2014   Procedure: CORONARY ARTERY BYPASS GRAFTING (CABG);  Surgeon: [name], MD;  Location: [facility];  Service: Open Heart Surgery;  Laterality: N/A;  .  EP IMPLANTABLE DEVICE N/A 05/01/2015   MDT ICD implanted for primary prevention of sudden death  . ESOPHAGOGASTRODUODENOSCOPY (EGD) WITH PROPOFOL N/A 04/16/2015   Procedure: ESOPHAGOGASTRODUODENOSCOPY (EGD) WITH PROPOFOL;  Surgeon: [name], MD;  Location: [facility] ENDOSCOPY;  Service: Endoscopy;  Laterality: N/A;  . ESOPHAGOGASTRODUODENOSCOPY (EGD) WITH PROPOFOL N/A 08/24/2017   Procedure: ESOPHAGOGASTRODUODENOSCOPY (EGD) WITH PROPOFOL;   Surgeon: [name], MD;  Location: [facility] ENDOSCOPY;  Service: Endoscopy;  Laterality: N/A;  . FRACTURE SURGERY    . HERNIA REPAIR    . HIP PINNING,CANNULATED Left 07/22/2018   Procedure: CANNULATED HIP PINNING;  Surgeon: [name], MD;  Location: [facility];  Service: Orthopedics;  Laterality: Left;  . MITRAL VALVE REPAIR N/A 12/30/2018   Procedure: MITRAL VALVE REPAIR;  Surgeon: [name], MD;  Location: [facility];  Service: Cardiovascular;  Laterality: N/A;  . RIGHT/LEFT HEART CATH AND CORONARY/GRAFT ANGIOGRAPHY N/A 12/21/2018   Procedure: RIGHT/LEFT HEART CATH AND CORONARY/GRAFT ANGIOGRAPHY;  Surgeon: [name], MD;  Location: [facility];  Service: Cardiovascular;  Laterality: N/A;  . TEE WITHOUT CARDIOVERSION N/A 08/03/2014   Procedure: TRANSESOPHAGEAL ECHOCARDIOGRAM (TEE);  Surgeon: [name], MD;  Location: [facility];  Service: Open Heart Surgery;  Laterality: N/A;  . TEE WITHOUT CARDIOVERSION N/A 02/10/2018   Procedure: TRANSESOPHAGEAL ECHOCARDIOGRAM (TEE);  Surgeon: [name], MD;  Location: [facility] ENDOSCOPY;  Service: Cardiovascular;  Laterality: N/A;    SOCIAL HISTORY: Social History   Socioeconomic History  . Marital status: Single    Spouse name: Not on file  . Number of children: Not on file  . Years of education: Not on file  . Highest education level: Not on file  Occupational History  . Not on file  Social Needs  . Financial resource strain: Not hard at all  . Food insecurity    Worry: Never true    Inability: Never true  . Transportation needs    Medical: No    Non-medical: No  Tobacco Use  . Smoking status: Former Smoker    Quit date: 2014    Years since quitting: 6.7  . Smokeless tobacco: Never Used  . Tobacco comment: 07/30/2017 Quit in 2014  Substance and Sexual Activity  . Alcohol use: No    Alcohol/week: 0.0 standard drinks  . Drug use: No  . Sexual activity: Never  Lifestyle  . Physical activity    Days per  week: Not on file    Minutes per session: Not on file  . Stress: Not on file  Relationships  . Social Herbalist on phone: Not on file    Gets together: Not on file    Attends religious service: Not on file    Active member of club or organization: Not on file    Attends meetings of clubs or organizations: Not on file    Relationship status: Not on file  . Intimate partner violence    Fear of current or ex partner: Not on file    Emotionally abused: Not on file    Physically abused: Not on file    Forced sexual activity: Not on file  Other Topics Concern  . Not on file  Social History Narrative   Pt lives in [place] with his mother.   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 Valvular heart disease Mother        Ruptured valve  .  CAD Father   . Heart Problems Brother        Stents x 4  . Diabetes Brother   . Prostate cancer Neg Hx   . Bladder Cancer Neg Hx   . Kidney cancer Neg Hx     ALLERGIES:  is allergic to spironolactone.  MEDICATIONS:  Current Outpatient Medications  Medication Sig Dispense Refill  . allopurinol (ZYLOPRIM) 100 MG tablet Take 100 mg by mouth daily.    . Ascorbic Acid (VITAMIN C) 100 MG tablet Take 100 mg by mouth daily.    Marland Kitchen aspirin EC 81 MG tablet Take 1 tablet (81 mg total) by mouth daily. 90 tablet 3  . atorvastatin (LIPITOR) 40 MG tablet Take 1 tablet (40 mg total) by mouth daily at 6 PM. 90 tablet 3  . carvedilol (COREG) 6.25 MG tablet Take 1 tablet (6.25 mg total) by mouth 2 (two) times daily. (Patient taking differently: Take 3.25 mg by mouth daily. ) 180 tablet 3  . Cholecalciferol (VITAMIN D3 SUPER STRENGTH) 50 MCG (2000 UT) TABS Take 2,000 Units by mouth daily.     . clopidogrel (PLAVIX) 75 MG tablet Take 1 tablet (75 mg total) by mouth daily with breakfast. 90 tablet 3  . cyanocobalamin 500 MCG tablet Take 500 mcg by mouth daily.    . digoxin (LANOXIN) 0.125  MG tablet Take 1 tablet (125 mcg total) by mouth daily. 30 tablet 11  . docusate sodium (COLACE) 100 MG capsule Take 100 mg by mouth daily.    . DULoxetine (CYMBALTA) 30 MG capsule Take 30 mg by mouth daily.    . ferrous sulfate 325 (65 FE) MG tablet Take 1 tablet (325 mg total) by mouth daily with breakfast. 30 tablet 3  . metolazone (ZAROXOLYN) 2.5 MG tablet TAKE 1 TAB BY MOUTH ONCE A WEEK. WEDNESDAY MORNINGS. TAKE 1 EXTRA POTASSIUM TABLET WITH THIS 5 tablet 11  . Multiple Vitamin (MULTIVITAMIN) tablet Take 1 tablet by mouth daily.    . pantoprazole (PROTONIX) 40 MG tablet Take 1 tablet (40 mg total) by mouth daily. 90 tablet 2  . potassium chloride SA (KLOR-CON) 20 MEQ tablet Take 3 tablets (60 mEq total) by mouth daily. 90 tablet 5  . torsemide (DEMADEX) 20 MG tablet Take 4 tablets (80 mg total) by mouth 2 (two) times daily. 240 tablet 5  . acetaminophen (TYLENOL) 325 MG tablet Take 325 mg by mouth every 6 (six) hours as needed for mild pain or moderate pain.     Marland Kitchen albuterol (PROVENTIL) (2.5 MG/3ML) 0.083% nebulizer solution Take 3 mLs by nebulization every 6 (six) hours as needed for wheezing or shortness of breath.     . citalopram (CELEXA) 20 MG tablet Take 20 mg by mouth daily.   5  . DR SMITHS DIAPER 10 % OINT Apply 1 application topically daily as needed (IRRITATION).     Marland Kitchen eplerenone (INSPRA) 50 MG tablet Take 1 tablet (50 mg total) by mouth daily. (Patient not taking: Reported on 06/03/2019) 30 tablet 6  . gabapentin (NEURONTIN) 100 MG capsule TAKE 1 CAPSULE BY MOUTH EVERY DAY IN THE EVENING    . ketoconazole (NIZORAL) 2 % shampoo Apply 1 application topically 2 (two) times a week.      No current facility-administered medications for this visit.      PHYSICAL EXAMINATION: ECOG PERFORMANCE STATUS: 2 - Symptomatic, <50% confined to bed Vitals:   06/03/19 1515  BP: 103/61  Pulse: 73  Resp: 16  Temp: 98.9 F (  37.2 C)   Filed Weights   06/03/19 1515  Weight: 160 lb 6.4 oz  (72.8 kg)    Physical Exam Constitutional:      General: He is not in acute distress.    Appearance: He is ill-appearing.     Comments: Walks with a walker.   HENT:     Head: Normocephalic and atraumatic.  Eyes:     General: No scleral icterus.    Pupils: Pupils are equal, round, and reactive to light.  Neck:     Musculoskeletal: Normal range of motion and neck supple.  Cardiovascular:     Rate and Rhythm: Normal rate and regular rhythm.     Heart sounds: Normal heart sounds.  Pulmonary:     Effort: Pulmonary effort is normal. No respiratory distress.     Breath sounds: Normal breath sounds. No wheezing.  Abdominal:     General: Bowel sounds are normal. There is no distension.     Palpations: Abdomen is soft. There is no mass.     Tenderness: There is no abdominal tenderness.  Musculoskeletal: Normal range of motion.        General: No swelling or deformity.  Skin:    General: Skin is warm and dry.     Findings: No erythema or rash.  Neurological:     Mental Status: He is alert and oriented to person, place, and time.     Cranial Nerves: No cranial nerve deficit.     Coordination: Coordination normal.  Psychiatric:        Behavior: Behavior normal.        Thought Content: Thought content normal.      LABORATORY DATA:  I have reviewed the data as listed Lab Results  Component Value Date   WBC 7.1 06/03/2019   HGB 9.8 (L) 06/03/2019   HCT 29.5 (L) 06/03/2019   MCV 99.7 06/03/2019   PLT 250 06/03/2019   Recent Labs    02/09/19 2129 03/03/19 2010  03/22/19 1541 04/14/19 1628 05/24/19 1422  NA 127* 131*   < > 131* 129* 128*  K 4.3 3.5   < > 4.0 3.7 3.5  CL 92* 94*   < > 91* 91* 92*  CO2 24 26   < > '25 27 26  ' GLUCOSE 186* 120*   < > 85 118* 95  BUN 28* 24*   < > 35* 24* 20  CREATININE 1.75* 1.37*   < > 1.45* 1.44* 1.47*  CALCIUM 8.9 8.8*   < > 9.4 9.0 8.7*  GFRNONAA 39* 53*   < > 49* 50* 49*  GFRAA 46* >60   < > 57* 58* 56*  PROT 7.3 7.0  --   --  7.3  --    ALBUMIN 3.6 3.5  --   --  4.0  --   AST 28 26  --   --  32  --   ALT 18 15  --   --  21  --   ALKPHOS 92 69  --   --  77  --   BILITOT 1.8* 1.6*  --   --  2.1*  --    < > = values in this interval not displayed.   Iron/TIBC/Ferritin/ %Sat    Component Value Date/Time   IRON 128 06/03/2019 1547   TIBC 501 (H) 06/03/2019 1547   FERRITIN 15 (L) 06/03/2019 1547   IRONPCTSAT 26 06/03/2019 1547     RADIOGRAPHIC STUDIES: I have personally reviewed  the radiological images as listed and agreed with the findings in the report. Ct Abdomen Pelvis W Contrast  Result Date: 04/14/2019 CLINICAL DATA:  68 year old male with acute abdominal and pelvic pain with nausea and vomiting. EXAM: CT ABDOMEN AND PELVIS WITH CONTRAST TECHNIQUE: Multidetector CT imaging of the abdomen and pelvis was performed using the standard protocol following bolus administration of intravenous contrast. CONTRAST:  12m OMNIPAQUE IOHEXOL 300 MG/ML  SOLN COMPARISON:  03/03/2019 and prior CTs FINDINGS: Lower chest: Marked cardiomegaly and bibasilar ground-glass/interstitial opacities again noted. No acute abnormality. Hepatobiliary: Cirrhosis again identified. No focal hepatic lesions are noted. The gallbladder is unremarkable. No biliary dilatation. Pancreas: Unremarkable Spleen: Unremarkable Adrenals/Urinary Tract: The kidneys, adrenal glands and bladder are unremarkable. Stomach/Bowel: Stomach is within normal limits. Appendix appears normal. No evidence of bowel wall thickening, distention, or inflammatory changes. Vascular/Lymphatic: Aortic atherosclerosis. No enlarged abdominal or pelvic lymph nodes. Reproductive: Prostate is unremarkable. Other: A small amount of ascites within the pelvis is noted. No focal abscess or pneumoperitoneum. Musculoskeletal: No acute or suspicious bony abnormalities. Surgical screws within the proximal LEFT femur and remote LEFT sacral fractures again identified. IMPRESSION: 1. No acute abnormality. 2.  Cirrhosis with small amount of pelvic ascites. 3.  Aortic Atherosclerosis (ICD10-I70.0). Electronically Signed   By: JMargarette CanadaM.D.   On: 04/14/2019 19:40     ASSESSMENT & PLAN:  1. Macrocytic anemia   2. Iron deficiency anemia, unspecified iron deficiency anemia type   Anemia: multifactorial with possible causes including chronic blood loss, hyper/hypothyroidism, nutritional deficiency, infection/chronic inflammation, hemolysis, underlying bone marrow disorders. Will check CBC w differential, CMP, vitamin B12, Folate, iron/TIBC, ferritin, reticulocytes monoclonal gammopathy evaluation.    History of CKD, likely anemia due to CKD.  Avoid nephrotoxins.  We discussed about options of erythropoietin replacement treatments.  I called his brother MRonalee Beltsand updated him .Return of visit: 2-3 weeks  Orders Placed This Encounter  Procedures  . CBC with Differential/Platelet    Standing Status:   Future    Number of Occurrences:   1    Standing Expiration Date:   06/02/2020  . Vitamin B12    Standing Status:   Future    Number of Occurrences:   1    Standing Expiration Date:   06/02/2020  . Folate    Standing Status:   Future    Number of Occurrences:   1    Standing Expiration Date:   06/02/2020  . Retic Panel    Standing Status:   Future    Number of Occurrences:   1    Standing Expiration Date:   06/02/2020  . Iron and TIBC    Standing Status:   Future    Number of Occurrences:   1    Standing Expiration Date:   06/02/2020  . Ferritin    Standing Status:   Future    Number of Occurrences:   1    Standing Expiration Date:   06/02/2020  . Multiple Myeloma Panel (SPEP&IFE w/QIG)    Standing Status:   Future    Number of Occurrences:   1    Standing Expiration Date:   06/02/2020  . Kappa/lambda light chains    Standing Status:   Future    Number of Occurrences:   1    Standing Expiration Date:   06/02/2020    All questions were answered. The patient knows to call the clinic with any  problems questions or concerns. Cc. JLavera Guise PUtah  Thank you for this kind referral and the opportunity to participate in the care of this patient. A copy of today's note is routed to referring provider  Total face to face encounter time for this patient visit was 30mn. >50% of the time was  spent in counseling and coordination of care.    ZEarlie Server MD, PhD 06/04/2019

## 2019-06-06 ENCOUNTER — Telehealth: Payer: Self-pay

## 2019-06-06 LAB — KAPPA/LAMBDA LIGHT CHAINS
Kappa free light chain: 50.1 mg/L — ABNORMAL HIGH (ref 3.3–19.4)
Kappa, lambda light chain ratio: 1.65 (ref 0.26–1.65)
Lambda free light chains: 30.4 mg/L — ABNORMAL HIGH (ref 5.7–26.3)

## 2019-06-06 NOTE — Telephone Encounter (Signed)
-----   Message from Earlie Server, MD sent at 06/04/2019 12:14 AM EDT ----- Please add IV venofer to his next visit with me thanks.

## 2019-06-06 NOTE — Telephone Encounter (Signed)
Phone call to patient to verify medication list and allergies for myelogram procedure. Spoke with pts brother, [name], who helps pt with appointments, medications, etc...  Pt and CG instructed to hold Cymbalta and Celexa for 48hrs prior to myelogram appointment time. Pt also on Plavix, which we will ask him to hold prior to his appointment, as we are awaiting further instructions from his Cardiologist, Dr. [name] on how many days.  Pt verbalized understanding.

## 2019-06-06 NOTE — Telephone Encounter (Signed)
Its already been scheduled .

## 2019-06-07 LAB — MULTIPLE MYELOMA PANEL, SERUM
Albumin SerPl Elph-Mcnc: 4.2 g/dL (ref 2.9–4.4)
Albumin/Glob SerPl: 1.5 (ref 0.7–1.7)
Alpha 1: 0.3 g/dL (ref 0.0–0.4)
Alpha2 Glob SerPl Elph-Mcnc: 0.6 g/dL (ref 0.4–1.0)
B-Globulin SerPl Elph-Mcnc: 1.1 g/dL (ref 0.7–1.3)
Gamma Glob SerPl Elph-Mcnc: 1 g/dL (ref 0.4–1.8)
Globulin, Total: 3 g/dL (ref 2.2–3.9)
IgA: 181 mg/dL (ref 61–437)
IgG (Immunoglobin G), Serum: 1108 mg/dL (ref 603–1613)
IgM (Immunoglobulin M), Srm: 65 mg/dL (ref 20–172)
Total Protein ELP: 7.2 g/dL (ref 6.0–8.5)

## 2019-06-10 ENCOUNTER — Telehealth (HOSPITAL_COMMUNITY): Payer: Self-pay

## 2019-06-10 NOTE — Telephone Encounter (Signed)
Clearance for back injections received and completed and faxed to [facility].  Per Dr [name], pt can completely stop plavix.  Called patients brother (care giver) and made him aware. Verbalized understanding.

## 2019-06-13 ENCOUNTER — Encounter (HOSPITAL_COMMUNITY): Payer: Self-pay

## 2019-06-13 ENCOUNTER — Encounter (HOSPITAL_COMMUNITY): Payer: Medicare Other

## 2019-06-13 ENCOUNTER — Other Ambulatory Visit (HOSPITAL_COMMUNITY): Payer: Self-pay

## 2019-06-13 ENCOUNTER — Ambulatory Visit (INDEPENDENT_AMBULATORY_CARE_PROVIDER_SITE_OTHER): Payer: Medicare Other

## 2019-06-13 DIAGNOSIS — Z95810 Presence of automatic (implantable) cardiac defibrillator: Secondary | ICD-10-CM | POA: Diagnosis not present

## 2019-06-13 DIAGNOSIS — I5022 Chronic systolic (congestive) heart failure: Secondary | ICD-10-CM

## 2019-06-13 NOTE — Progress Notes (Signed)
Today was a home visit with Carl [name].  He appears a little confused or rattled today.  He has not been taking his medications right, he is forgetting some of them and taking all of them at once a day.  His brother Carl [name] contacted me earlier and advised he found a bunch of pills Carl [name] had not taken.  Refilled his med boxes and called in digoxin.  Went through with Carl [name] how to take his medications and Carl [name] appeared to understand.  Carl [name] his brother is looking for help with daily things for Carl [name], such as cooking, cleaning and making sure he is taking his medications.  Spoke with Carl [name] and he got his appts confused, he missed appt today with HF clinic, rescheduled it for them and he states he will have him there this Thursday.  Carl [name] has no complaints today.  Discussed with him low sodium foods and how much water he should be drinking daily.  He forgot to weight this AM.  He denies chest pain, headaches, dizziness or headaches.  Will continue to visit for heart failure.   [place] [phone]

## 2019-06-14 NOTE — Progress Notes (Signed)
EPIC Encounter for ICM Monitoring  Patient Name: Carl [name] is a 68 y.o. male Date: 06/14/2019 Primary Care Physican: [name], MD Primary Cardiologist:[place] Electrophysiologist: [place] 03/07/2019 Weight: 149 lbs  Since 25-Apr-2019  Time in AF  <0.1 hr/day (0.2%)     Transmission reviewed    OptivolThoracic impedancenormal.  Prescribed:Torsemide20mg take4tablets(80 mg total)by mouth twice a day.  Labs: 05/24/2019 Creatinine 1.47, BUN 20, Potassium 3.5, Sodium 128, GFR 49-56 04/14/2019 Creatinine 1.44, BUN 24, Potassium 3.7, Sodium 139, GFR 50-58 03/22/2019 Creatinine 1.45, BUN 35, Potassium 4.0, Sodium 131, GFR 49-57 03/04/2019 Creatinine1.39, BUN24, Potassium3.3, Sodium135, GFR52->60 03/03/2019 Creatinine1.37, BUN24, Potassium3.4, Sodium131, GFR53->60  02/09/2019 Creatinine1.75, BUN28, Potassium4.3, [id number], [id number]  A complete set of results can be found in Results Review  Recommendations: None.  Follow-up plan: ICM clinic phone appointment on 07/18/2019.    Office appt 06/16/2019 with HF clinic PA/NP.    Copy of ICM check sent to Dr. Rayann Heman.   3 month ICM trend: 06/13/2019    1 Year ICM trend:       Rosalene Billings, RN 06/14/2019 12:33 PM

## 2019-06-15 ENCOUNTER — Encounter (HOSPITAL_COMMUNITY): Payer: Medicare Other

## 2019-06-15 ENCOUNTER — Other Ambulatory Visit (HOSPITAL_COMMUNITY): Payer: Self-pay | Admitting: Cardiology

## 2019-06-16 ENCOUNTER — Ambulatory Visit (HOSPITAL_COMMUNITY)
Admission: RE | Admit: 2019-06-16 | Discharge: 2019-06-16 | Disposition: A | Discharge status: Home / Self Care | Payer: Medicare Other | Source: Ambulatory Visit | Attending: Adult Health | Admitting: Adult Health

## 2019-06-16 ENCOUNTER — Other Ambulatory Visit: Payer: Self-pay

## 2019-06-16 VITALS — BP 110/62 | HR 76 | Wt 159.2 lb

## 2019-06-16 DIAGNOSIS — I5022 Chronic systolic (congestive) heart failure: Secondary | ICD-10-CM | POA: Diagnosis not present

## 2019-06-16 DIAGNOSIS — N182 Chronic kidney disease, stage 2 (mild): Secondary | ICD-10-CM

## 2019-06-16 DIAGNOSIS — F70 Mild intellectual disabilities: Secondary | ICD-10-CM | POA: Insufficient documentation

## 2019-06-16 DIAGNOSIS — Z8249 Family history of ischemic heart disease and other diseases of the circulatory system: Secondary | ICD-10-CM | POA: Diagnosis not present

## 2019-06-16 DIAGNOSIS — Z79899 Other long term (current) drug therapy: Secondary | ICD-10-CM | POA: Insufficient documentation

## 2019-06-16 DIAGNOSIS — Z7982 Long term (current) use of aspirin: Secondary | ICD-10-CM | POA: Diagnosis not present

## 2019-06-16 DIAGNOSIS — R0602 Shortness of breath: Secondary | ICD-10-CM | POA: Insufficient documentation

## 2019-06-16 DIAGNOSIS — I2510 Atherosclerotic heart disease of native coronary artery without angina pectoris: Secondary | ICD-10-CM | POA: Insufficient documentation

## 2019-06-16 DIAGNOSIS — Z95810 Presence of automatic (implantable) cardiac defibrillator: Secondary | ICD-10-CM

## 2019-06-16 DIAGNOSIS — I255 Ischemic cardiomyopathy: Secondary | ICD-10-CM | POA: Diagnosis not present

## 2019-06-16 DIAGNOSIS — Z87891 Personal history of nicotine dependence: Secondary | ICD-10-CM | POA: Insufficient documentation

## 2019-06-16 DIAGNOSIS — N183 Chronic kidney disease, stage 3 unspecified: Secondary | ICD-10-CM | POA: Diagnosis not present

## 2019-06-16 DIAGNOSIS — I252 Old myocardial infarction: Secondary | ICD-10-CM | POA: Insufficient documentation

## 2019-06-16 DIAGNOSIS — E611 Iron deficiency: Secondary | ICD-10-CM | POA: Diagnosis not present

## 2019-06-16 DIAGNOSIS — Z951 Presence of aortocoronary bypass graft: Secondary | ICD-10-CM | POA: Insufficient documentation

## 2019-06-16 DIAGNOSIS — K7460 Unspecified cirrhosis of liver: Secondary | ICD-10-CM | POA: Insufficient documentation

## 2019-06-16 DIAGNOSIS — M542 Cervicalgia: Secondary | ICD-10-CM | POA: Diagnosis not present

## 2019-06-16 DIAGNOSIS — Z7902 Long term (current) use of antithrombotics/antiplatelets: Secondary | ICD-10-CM | POA: Insufficient documentation

## 2019-06-16 DIAGNOSIS — F329 Major depressive disorder, single episode, unspecified: Secondary | ICD-10-CM | POA: Insufficient documentation

## 2019-06-16 LAB — BASIC METABOLIC PANEL
Anion gap: 15 (ref 5–15)
BUN: 38 mg/dL — ABNORMAL HIGH (ref 8–23)
CO2: 29 mmol/L (ref 22–32)
Calcium: 9.4 mg/dL (ref 8.9–10.3)
Chloride: 87 mmol/L — ABNORMAL LOW (ref 98–111)
Creatinine, Ser: 1.66 mg/dL — ABNORMAL HIGH (ref 0.61–1.24)
GFR calc Af Amer: 49 mL/min — ABNORMAL LOW (ref >60)
GFR calc non Af Amer: 42 mL/min — ABNORMAL LOW (ref >60)
Glucose, Bld: 120 mg/dL — ABNORMAL HIGH (ref 70–99)
Potassium: 3.7 mmol/L (ref 3.5–5.1)
Sodium: 131 mmol/L — ABNORMAL LOW (ref 135–145)

## 2019-06-16 NOTE — Progress Notes (Signed)
Patient is coming in for follow up, he is doing well. Please call his brother during visit.   [phone]
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
## 2019-06-16 NOTE — Patient Instructions (Signed)
Lab work done today. We will notify you of any abnormal lab work. No news is good news!  Please follow up with the [facility] in 3 months.  At the [facility], you and your health needs are our priority. As part of our continuing mission to provide you with exceptional heart care, we have created designated Provider Care Teams. These Care Teams include your primary Cardiologist (physician) and Advanced Practice Providers (APPs- Physician Assistants and Nurse Practitioners) who all work together to provide you with the care you need, when you need it.   You may see any of the following providers on your designated Care Team at your next follow up: Marland Kitchen Dr [name] . Dr [name] . Darrick Grinder, NP . Lyda Jester, PA   Please be sure to bring in all your medications bottles to every appointment.

## 2019-06-17 ENCOUNTER — Encounter: Payer: Self-pay | Admitting: Oncology

## 2019-06-17 ENCOUNTER — Inpatient Hospital Stay: Payer: Medicare Other

## 2019-06-17 ENCOUNTER — Other Ambulatory Visit: Payer: Self-pay

## 2019-06-17 ENCOUNTER — Inpatient Hospital Stay (HOSPITAL_BASED_OUTPATIENT_CLINIC_OR_DEPARTMENT_OTHER): Payer: Medicare Other | Admitting: Oncology

## 2019-06-17 VITALS — BP 112/58 | HR 65 | Temp 96.8°F | Resp 18 | Wt 160.5 lb

## 2019-06-17 DIAGNOSIS — D631 Anemia in chronic kidney disease: Secondary | ICD-10-CM

## 2019-06-17 DIAGNOSIS — D509 Iron deficiency anemia, unspecified: Secondary | ICD-10-CM | POA: Diagnosis not present

## 2019-06-17 DIAGNOSIS — I5022 Chronic systolic (congestive) heart failure: Secondary | ICD-10-CM

## 2019-06-17 DIAGNOSIS — R195 Other fecal abnormalities: Secondary | ICD-10-CM | POA: Diagnosis not present

## 2019-06-17 DIAGNOSIS — N1831 Chronic kidney disease, stage 3a: Secondary | ICD-10-CM | POA: Diagnosis not present

## 2019-06-17 MED ORDER — SODIUM CHLORIDE 0.9 % IV SOLN
Freq: Once | INTRAVENOUS | Status: AC
  Administered 2019-06-17: 14:00:00 via INTRAVENOUS
  Filled 2019-06-17: qty 250

## 2019-06-17 MED ORDER — IRON SUCROSE 20 MG/ML IV SOLN
200.0000 mg | Freq: Once | INTRAVENOUS | Status: AC
  Administered 2019-06-17: 200 mg via INTRAVENOUS
  Filled 2019-06-17: qty 10

## 2019-06-17 MED ORDER — SODIUM CHLORIDE 0.9 % IV SOLN: 200.0000 mg | Freq: Once | INTRAVENOUS | Status: DC

## 2019-06-17 NOTE — Progress Notes (Signed)
Patient does not offer any problems today.  

## 2019-06-17 NOTE — Progress Notes (Signed)
Hematology/Oncology follow up  note [facility] Telephone:(336) [phone] Fax:(336) [phone]   Patient Care Team: [name], MD as PCP - General (Internal Medicine)  REFERRING PROVIDER: [name], MD  CHIEF COMPLAINTS/REASON FOR VISIT:  Follow-up for anemia  HISTORY OF PRESENTING ILLNESS:  Carl [name] is a  68 y.o.  male with PMH listed below who was referred to me for evaluation of anemia Reviewed patient's recent labs that was done.  05/25/2019 Labs revealed anemia with hemoglobin of 8.8 Reviewed patient's previous labs  anemia is chronic onset , duration is since 2016 No aggravating or improving factors.  Associated signs and symptoms: Patient reports fatigue. denies SOB with exertion. He is a poor historian Denies weight loss, easy bruising, hematochezia, hemoptysis, hematuria. Context: History of GI bleeding: denies               History of Chronic kidney disease                               Last colonoscopy: 2018 colonoscopy showed one small polyp in the sigmoid colon, removed with a hot snare. Resected and retrieved. bleeding colonic angioectasia. Treated with argon plasma coagulation (APC). -EGD 2018 showed esophagitis.   Lives by himself. Family sister/brother bring him food.   INTERVAL HISTORY Carl [name] is a 68 y.o. male who has above history reviewed by me today presents for follow up visit for management of Anemia Problems and complaints are listed below: Patient is a poor historian.  No new complaints today. I called patient's brother [name] during encounter.  Review of Systems  Constitutional: Positive for fatigue. Negative for appetite change, chills, fever and unexpected weight change.  HENT:   Negative for hearing loss and voice change.   Eyes: Negative for eye problems and icterus.  Respiratory: Negative for chest tightness, cough and shortness of breath.   Cardiovascular: Negative for chest pain and leg swelling.    Gastrointestinal: Negative for abdominal distention, abdominal pain and blood in stool.  Endocrine: Negative for hot flashes.  Genitourinary: Negative for difficulty urinating, dysuria and frequency.   Musculoskeletal: Negative for arthralgias.  Skin: Negative for itching and rash.  Neurological: Negative for extremity weakness, light-headedness and numbness.  Hematological: Negative for adenopathy. Does not bruise/bleed easily.  Psychiatric/Behavioral: Negative for confusion.     MEDICAL HISTORY:  Past Medical History:  Diagnosis Date   AICD (automatic cardioverter/defibrillator) present    Anemia    Anxiety    Arthritis    Brunner's gland hyperplasia of duodenum    CHF (congestive heart failure) (HCC)    Coronary artery disease    External hemorrhoids    Fracture of femoral neck, left (HCC) 07/21/2018   GERD (gastroesophageal reflux disease)    Hearing loss    HH (hiatus hernia)    Internal hemorrhoids    Ischemic cardiomyopathy    Leg fracture, left    Myocardial infarction ([place])    Paronychia    Pneumonia    Psoriasis    PUD (peptic ulcer disease)    Schatzki's ring    Tubular adenoma of colon    Vitiligo     SURGICAL HISTORY: Past Surgical History:  Procedure Laterality Date   ATRIAL SEPTAL DEFECT(ASD) CLOSURE N/A 12/30/2018   Procedure: ATRIAL SEPTAL DEFECT(ASD) CLOSURE;  Surgeon: [name], MD;  Location: [place] [facility];  Service: Cardiovascular;  Laterality: N/A;   CARDIAC SURGERY  COLONOSCOPY WITH ESOPHAGOGASTRODUODENOSCOPY (EGD)     COLONOSCOPY WITH PROPOFOL N/A 08/24/2017   Procedure: COLONOSCOPY WITH PROPOFOL;  Surgeon: [name], MD;  Location: [facility] ENDOSCOPY;  Service: Endoscopy;  Laterality: N/A;   CORONARY ARTERY BYPASS GRAFT N/A 08/03/2014   Procedure: CORONARY ARTERY BYPASS GRAFTING (CABG);  Surgeon: [name], MD;  Location: [place];  Service: Open Heart Surgery;  Laterality: N/A;   EP  IMPLANTABLE DEVICE N/A 05/01/2015   MDT ICD implanted for primary prevention of sudden death   ESOPHAGOGASTRODUODENOSCOPY (EGD) WITH PROPOFOL N/A 04/16/2015   Procedure: ESOPHAGOGASTRODUODENOSCOPY (EGD) WITH PROPOFOL;  Surgeon: [name], MD;  Location[facility] END[facility]COPY;  Service: Endoscopy;  Laterality: N/A;   ESOPHAGOGASTRODUODENOSCOPY (EGD) WITH PROPOFOL N/A 08/24/2017   Procedure: ESOPHAGOGASTRODUODENOSCOPY (EGD) WITH PROPOFOL;  Surgeon: [name], MD;  Location: [facility] ENDOSCOPY;  Service: Endoscopy;  Laterality: N/A;   FRACTURE SURGERY     HERNIA REPAIR     HIP PINNING,CANNULATED Left 07/22/2018   Procedure: CANNULATED HIP PINNING;  Surgeon: [name], MD;  Location: [place];  Service: Orthopedics;  Laterality: Left;   MITRAL VALVE REPAIR N/A 12/30/2018   Procedure: MITRAL VALVE REPAIR;  Surgeon: [name], MD;  Location: [place] [facility];  Service: Cardiovascular;  Laterality: N/A;   RIGHT/LEFT HEART CATH AND CORONARY/GRAFT ANGIOGRAPHY N/A 12/21/2018   Procedure: RIGHT/LEFT HEART CATH AND CORONARY/GRAFT ANGIOGRAPHY;  Surgeon: [name], MD;  Location: [place] [facility];  Service: Cardiovascular;  Laterality: N/A;   TEE WITHOUT CARDIOVERSION N/A 08/03/2014   Procedure: TRANSESOPHAGEAL ECHOCARDIOGRAM (TEE);  Surgeon: [name], MD;  Location: [place];  Service: Open Heart Surgery;  Laterality: N/A;   TEE WITHOUT CARDIOVERSION N/A 02/10/2018   Procedure: TRANSESOPHAGEAL ECHOCARDIOGRAM (TEE);  Surgeon: [name], MD;  Location: [facility] ENDOSCOPY;  Service: Cardiovascular;  Laterality: N/A;    SOCIAL HISTORY: Social History   Socioeconomic History   Marital status: Single    Spouse name: Not on file   Number of children: Not on file   Years of education: Not on file   Highest education level: Not on file  Occupational History   Not on file  Social Needs   Financial resource strain: Not hard at all   Food insecurity    Worry: Never true     Inability: Never true   Transportation needs    Medical: No    Non-medical: No  Tobacco Use   Smoking status: Former Smoker    Quit date: 2014    Years since quitting: 6.8   Smokeless tobacco: Never Used   Tobacco comment: 07/30/2017 Quit in 2014  Substance and Sexual Activity   Alcohol use: No    Alcohol/week: 0.0 standard drinks   Drug use: No   Sexual activity: Never  Lifestyle   Physical activity    Days per week: Not on file    Minutes per session: Not on file   Stress: Not on file  Relationships   Social connections    Talks on phone: Not on file    Gets together: Not on file    Attends religious service: Not on file    Active member of club or organization: Not on file    Attends meetings of clubs or organizations: Not on file    Relationship status: Not on file   Intimate partner violence    Fear of current or ex partner: Not on file    Emotionally abused: Not on file    Physically  abused: Not on file    Forced sexual activity: Not on file  Other Topics Concern   Not on file  Social History Narrative   Pt lives in [place] with his mother.   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  MEDICATIONS:  Current Outpatient Medications  Medication Sig Dispense Refill   acetaminophen (TYLENOL) 325 MG tablet Take 325 mg by mouth every 6 (six) hours as needed for mild pain or moderate pain.      allopurinol (ZYLOPRIM) 100 MG tablet Take 100 mg by mouth daily.     Ascorbic Acid (VITAMIN C) 100 MG tablet Take 100 mg by mouth daily.     aspirin EC 81 MG tablet Take 1 tablet (81 mg total) by mouth daily. 90 tablet 3    atorvastatin (LIPITOR) 40 MG tablet Take 1 tablet (40 mg total) by mouth daily at 6 PM. 90 tablet 3   carvedilol (COREG) 6.25 MG tablet Take 1 tablet (6.25 mg total) by mouth 2 (two) times daily. 180 tablet 3   Cholecalciferol (VITAMIN D3 SUPER STRENGTH) 50 MCG (2000 UT) TABS Take 2,000 Units by mouth daily.      citalopram (CELEXA) 20 MG tablet Take 20 mg by mouth daily.   5   cyanocobalamin 500 MCG tablet Take 500 mcg by mouth daily.     digoxin (LANOXIN) 0.125 MG tablet TAKE 1 TABLET BY MOUTH EVERY DAY 90 tablet 3   docusate sodium (COLACE) 100 MG capsule Take 100 mg by mouth daily.     DR SMITHS DIAPER 10 % OINT Apply 1 application topically daily as needed (IRRITATION).      DULoxetine (CYMBALTA) 30 MG capsule Take 30 mg by mouth daily.     eplerenone (INSPRA) 50 MG tablet Take 1 tablet (50 mg total) by mouth daily. 30 tablet 6   ferrous sulfate 325 (65 FE) MG tablet Take 1 tablet (325 mg total) by mouth daily with breakfast. 30 tablet 3   gabapentin (NEURONTIN) 300 MG capsule Take 300 mg by mouth daily.     ketoconazole (NIZORAL) 2 % shampoo Apply 1 application topically 2 (two) times a week.      metolazone (ZAROXOLYN) 2.5 MG tablet TAKE 1 TAB BY MOUTH ONCE A WEEK. WEDNESDAY MORNINGS. TAKE 1 EXTRA POTASSIUM TABLET WITH THIS 5 tablet 11   Multiple Vitamin (MULTIVITAMIN) tablet Take 1 tablet by mouth daily.     pantoprazole (PROTONIX) 40 MG tablet Take 1 tablet (40 mg total) by mouth daily. 90 tablet 2   potassium chloride SA (KLOR-CON) 20 MEQ tablet Take 3 tablets (60 mEq total) by mouth daily. 90 tablet 5   torsemide (DEMADEX) 20 MG tablet Take 4 tablets (80 mg total) by mouth 2 (two) times daily. 240 tablet 5   No current facility-administered medications for this visit.      PHYSICAL EXAMINATION: ECOG PERFORMANCE STATUS: 2 - Symptomatic, <50% confined to bed Vitals:   06/17/19 1339  BP: (!) 112/58  Pulse: 65  Resp: 18  Temp: (!)  96.8 F (36 C)   Filed  Weights   06/17/19 1339  Weight: 160 lb 8 oz (72.8 kg)    Physical Exam Constitutional:      General: He is not in acute distress.    Appearance: He is ill-appearing.     Comments: Walks with a walker.   HENT:     Head: Normocephalic and atraumatic.  Eyes:     General: No scleral icterus.    Pupils: Pupils are equal, round, and reactive to light.  Neck:     Musculoskeletal: Normal range of motion and neck supple.  Cardiovascular:     Rate and Rhythm: Normal rate and regular rhythm.     Heart sounds: Normal heart sounds.  Pulmonary:     Effort: Pulmonary effort is normal. No respiratory distress.     Breath sounds: Normal breath sounds. No wheezing.  Abdominal:     General: Bowel sounds are normal. There is no distension.     Palpations: Abdomen is soft. There is no mass.     Tenderness: There is no abdominal tenderness.  Musculoskeletal: Normal range of motion.        General: No swelling or deformity.  Skin:    General: Skin is warm and dry.     Findings: No erythema or rash.  Neurological:     Mental Status: He is alert and oriented to person, place, and time.     Cranial Nerves: No cranial nerve deficit.     Coordination: Coordination normal.  Psychiatric:        Behavior: Behavior normal.        Thought Content: Thought content normal.      LABORATORY DATA:  I have reviewed the data as listed Lab Results  Component Value Date   WBC 7.1 06/03/2019   HGB 9.8 (L) 06/03/2019   HCT 29.5 (L) 06/03/2019   MCV 99.7 06/03/2019   PLT 250 06/03/2019   Recent Labs    02/09/19 2129 03/03/19 2010  04/14/19 1628 05/24/19 1422 06/16/19 1432  NA 127* 131*   < > 129* 128* 131*  K 4.3 3.5   < > 3.7 3.5 3.7  CL 92* 94*   < > 91* 92* 87*  CO2 24 26   < > 27 26 29   GLUCOSE 186* 120*   < > 118* 95 120*  BUN 28* 24*   < > 24* 20 38*  CREATININE 1.75* 1.37*   < > 1.44* 1.47* 1.66*  CALCIUM 8.9 8.8*   < > 9.0 8.7* 9.4  GFRNONAA 39* 53*   < > 50* 49* 42*  GFRAA 46* >60    < > 58* 56* 49*  PROT 7.3 7.0  --  7.3  --   --   ALBUMIN 3.6 3.5  --  4.0  --   --   AST 28 26  --  32  --   --   ALT 18 15  --  21  --   --   ALKPHOS 92 69  --  77  --   --   BILITOT 1.8* 1.6*  --  2.1*  --   --    < > = values in this interval not displayed.   Iron/TIBC/Ferritin/ %Sat    Component Value Date/Time   IRON 128 06/03/2019 1547   TIBC 501 (H) 06/03/2019 1547   FERRITIN 15 (L) 06/03/2019 1547   IRONPCTSAT 26 06/03/2019 1547     RADIOGRAPHIC STUDIES: I have  personally reviewed the radiological images as listed and agreed with the findings in the report. Ct Abdomen Pelvis W Contrast  Result Date: 04/14/2019 CLINICAL DATA:  68 year old male with acute abdominal and pelvic pain with nausea and vomiting. EXAM: CT ABDOMEN AND PELVIS WITH CONTRAST TECHNIQUE: Multidetector CT imaging of the abdomen and pelvis was performed using the standard protocol following bolus administration of intravenous contrast. CONTRAST:  169mL OMNIPAQUE IOHEXOL 300 MG/ML  SOLN COMPARISON:  03/03/2019 and prior CTs FINDINGS: Lower chest: Marked cardiomegaly and bibasilar ground-glass/interstitial opacities again noted. No acute abnormality. Hepatobiliary: Cirrhosis again identified. No focal hepatic lesions are noted. The gallbladder is unremarkable. No biliary dilatation. Pancreas: Unremarkable Spleen: Unremarkable Adrenals/Urinary Tract: The kidneys, adrenal glands and bladder are unremarkable. Stomach/Bowel: Stomach is within normal limits. Appendix appears normal. No evidence of bowel wall thickening, distention, or inflammatory changes. Vascular/Lymphatic: Aortic atherosclerosis. No enlarged abdominal or pelvic lymph nodes. Reproductive: Prostate is unremarkable. Other: A small amount of ascites within the pelvis is noted. No focal abscess or pneumoperitoneum. Musculoskeletal: No acute or suspicious bony abnormalities. Surgical screws within the proximal LEFT femur and remote LEFT sacral fractures again  identified. IMPRESSION: 1. No acute abnormality. 2. Cirrhosis with small amount of pelvic ascites. 3.  Aortic Atherosclerosis (ICD10-I70.0). Electronically Signed   By: Margarette Canada M.D.   On: 04/14/2019 19:40     ASSESSMENT & PLAN:  1. Iron deficiency anemia, unspecified iron deficiency anemia type   2. Anemia in stage 3a chronic kidney disease   3. Occult blood positive stool   4. Chronic systolic CHF (congestive heart failure) (HCC)    Discussed with patient that patient's anemia is multifactorial secondary to iron deficiency as well as anemia secondary to chronic kidney disease. I recommend IV iron infusions. Plan IV iron with Venofer 200mg  weekly x 4 doses. Allergy reactions/infusion reaction including anaphylactic reaction discussed with patient. Other side effects include but not limited to high blood pressure, skin rash, weight gain, leg swelling, etc. Patient voices understanding and willing to proceed.  Pt  will proceed with first dose of Venofer today. GI bleeding,, patient follows up with gastroenterology at the [facility].  Was seen by Ms. Joelene Millin on 05/12/2019.  Patient has occult positive stool .patient will be scheduled for colonoscopy and EGD after obtaining cardiology clearance.   CHF, LVEF 20 to 25%, history of MV repair in May 2020.  On Plavix.  And aspirin.  patient's brother [name] was called and updated about the plan of care.  He agrees with the plan.   Orders Placed This Encounter  Procedures   CBC with Differential/Platelet    Standing Status:   Future    Standing Expiration Date:   06/17/2020   Iron and TIBC    Standing Status:   Future    Standing Expiration Date:   06/17/2020   Ferritin    Standing Status:   Future    Standing Expiration Date:   06/17/2020    All questions were answered. The patient knows to call the clinic with any problems questions or concerns. Ollen Gross, MD   Earlie Server, MD, PhD 06/17/2019

## 2019-06-18 ENCOUNTER — Encounter: Payer: Self-pay | Admitting: Oncology

## 2019-06-20 ENCOUNTER — Ambulatory Visit
Admission: RE | Admit: 2019-06-20 | Discharge: 2019-06-20 | Disposition: A | Discharge status: Home / Self Care | Payer: Medicare Other | Source: Ambulatory Visit | Attending: Orthopedic Surgery | Admitting: Orthopedic Surgery

## 2019-06-20 ENCOUNTER — Other Ambulatory Visit: Payer: Self-pay

## 2019-06-20 DIAGNOSIS — M545 Low back pain, unspecified: Secondary | ICD-10-CM

## 2019-06-20 DIAGNOSIS — G8929 Other chronic pain: Secondary | ICD-10-CM

## 2019-06-20 MED ORDER — IOPAMIDOL (ISOVUE-M 200) INJECTION 41%
15.0000 mL | Freq: Once | INTRAMUSCULAR | Status: AC
  Administered 2019-06-20: 15 mL via INTRATHECAL

## 2019-06-20 MED ORDER — DIAZEPAM 5 MG PO TABS
5.0000 mg | ORAL_TABLET | Freq: Once | ORAL | Status: AC
  Administered 2019-06-20: 5 mg via ORAL

## 2019-06-20 NOTE — Progress Notes (Signed)
Patient's brother states patient has been off Plavix for the past five days, and off the Cymbalta and Celexa for the past two days.

## 2019-06-20 NOTE — Discharge Instructions (Signed)
Myelogram Discharge Instructions  1. Go home and rest quietly for the next 24 hours.  It is important to lie flat for the next 24 hours.  Get up only to go to the restroom.  You may lie in the bed or on a couch on your back, your stomach, your left side or your right side.  You may have one pillow under your head.  You may have pillows between your knees while you are on your side or under your knees while you are on your back.  2. DO NOT drive today.  Recline the seat as far back as it will go, while still wearing your seat belt, on the way home.  3. You may get up to go to the bathroom as needed.  You may sit up for 10 minutes to eat.  You may resume your normal diet and medications unless otherwise indicated.  Drink lots of extra fluids today and tomorrow.  4. The incidence of headache, nausea, or vomiting is about 5% (one in 20 patients).  If you develop a headache, lie flat and drink plenty of fluids until the headache goes away.  Caffeinated beverages may be helpful.  If you develop severe nausea and vomiting or a headache that does not go away with flat bed rest, call [phone].  5. You may resume normal activities after your 24 hours of bed rest is over; however, do not exert yourself strongly or do any heavy lifting tomorrow. If when you get up you have a headache when standing, go back to bed and force fluids for another 24 hours.  6. Call your physician for a follow-up appointment.  The results of your myelogram will be sent directly to your physician by the following day.  7. If you have any questions or if complications develop after you arrive home, please call [phone].  Discharge instructions have been explained to the patient.  The patient, or the person responsible for the patient, fully understands these instructions   YOU MAY RESTART YOUR PLAVIX TODAY 06/20/2019  YOU MAY RESTART YOUR CYMBALTA AND CELEXA ON 06/22/2019 AT 930 AM.

## 2019-06-21 ENCOUNTER — Other Ambulatory Visit: Payer: Self-pay

## 2019-06-22 ENCOUNTER — Inpatient Hospital Stay: Payer: Medicare Other

## 2019-06-22 ENCOUNTER — Other Ambulatory Visit: Payer: Self-pay

## 2019-06-22 VITALS — BP 116/65 | HR 66 | Resp 18

## 2019-06-22 DIAGNOSIS — D509 Iron deficiency anemia, unspecified: Secondary | ICD-10-CM | POA: Diagnosis not present

## 2019-06-22 MED ORDER — SODIUM CHLORIDE 0.9 % IV SOLN
Freq: Once | INTRAVENOUS | Status: AC
  Administered 2019-06-22: 14:00:00 via INTRAVENOUS
  Filled 2019-06-22: qty 250

## 2019-06-22 MED ORDER — IRON SUCROSE 20 MG/ML IV SOLN
200.0000 mg | Freq: Once | INTRAVENOUS | Status: AC
  Administered 2019-06-22: 200 mg via INTRAVENOUS
  Filled 2019-06-22: qty 10

## 2019-06-22 MED ORDER — SODIUM CHLORIDE 0.9 % IV SOLN: 200.0000 mg | Freq: Once | INTRAVENOUS | Status: DC

## 2019-06-27 ENCOUNTER — Other Ambulatory Visit (HOSPITAL_COMMUNITY): Payer: Self-pay | Admitting: Cardiology

## 2019-06-27 ENCOUNTER — Other Ambulatory Visit (HOSPITAL_COMMUNITY): Payer: Self-pay

## 2019-06-27 ENCOUNTER — Encounter (HOSPITAL_COMMUNITY): Payer: Self-pay

## 2019-06-27 NOTE — Progress Notes (Signed)
Today was a home visit with Carl [name].  Carl [name] states he is doing ok.  He denies chest pain, shortness of breath, headaches or dizziness.  His meds are spread out and disorganized.  Someone has done up 1 of his weeks box.  Placed meds in a another box.  It appeasr there are meds missing from the last count I had at [facility].  Called in potassium, protonix, lipitor, cymbalta, and gabapentin.  From my last count he should had enough meds for 2 weeks this week when I showed up, he is short on protonix and gabapentin.  Will speak to his brother Carl [name] about his medications, Carl [name] states that Carl [name] and his sister has been doing his medications.  Will come up with a plan to not have 3 people trying to keep up with his medications.  Carl [name] states his daughter stayed a couple of days last week and messed with them also.  It is hard to tell if he is taking them right or not.  He has no swelling in lower extremities today.  Lungs are clear.  Carl [name] is weighing daily.  Will plan to visit weekly til medications are back in order and I can tell what he is taking.  Will visit for heart failure, diet and medication compliance.   [place] [phone]

## 2019-06-30 ENCOUNTER — Inpatient Hospital Stay: Payer: Medicare Other | Attending: Oncology

## 2019-06-30 ENCOUNTER — Other Ambulatory Visit: Payer: Self-pay

## 2019-06-30 VITALS — BP 118/55 | HR 77 | Temp 96.0°F | Resp 20

## 2019-06-30 DIAGNOSIS — D509 Iron deficiency anemia, unspecified: Secondary | ICD-10-CM | POA: Diagnosis not present

## 2019-06-30 MED ORDER — SODIUM CHLORIDE 0.9 % IV SOLN
Freq: Once | INTRAVENOUS | Status: AC
  Administered 2019-06-30: 14:00:00 via INTRAVENOUS
  Filled 2019-06-30: qty 250

## 2019-06-30 MED ORDER — IRON SUCROSE 20 MG/ML IV SOLN
200.0000 mg | Freq: Once | INTRAVENOUS | Status: AC
  Administered 2019-06-30: 14:00:00 200 mg via INTRAVENOUS
  Filled 2019-06-30: qty 10

## 2019-07-07 ENCOUNTER — Other Ambulatory Visit: Payer: Self-pay

## 2019-07-07 ENCOUNTER — Inpatient Hospital Stay: Payer: Medicare Other

## 2019-07-07 VITALS — BP 100/62 | HR 68 | Temp 97.7°F | Resp 18

## 2019-07-07 DIAGNOSIS — D509 Iron deficiency anemia, unspecified: Secondary | ICD-10-CM

## 2019-07-07 MED ORDER — SODIUM CHLORIDE 0.9 % IV SOLN
Freq: Once | INTRAVENOUS | Status: AC
  Administered 2019-07-07: 14:00:00 via INTRAVENOUS
  Filled 2019-07-07: qty 250

## 2019-07-07 MED ORDER — IRON SUCROSE 20 MG/ML IV SOLN
200.0000 mg | Freq: Once | INTRAVENOUS | Status: AC
  Administered 2019-07-07: 200 mg via INTRAVENOUS
  Filled 2019-07-07: qty 10

## 2019-07-11 ENCOUNTER — Other Ambulatory Visit (HOSPITAL_COMMUNITY): Payer: Self-pay

## 2019-07-11 ENCOUNTER — Ambulatory Visit (INDEPENDENT_AMBULATORY_CARE_PROVIDER_SITE_OTHER): Payer: Medicare Other | Admitting: Podiatry

## 2019-07-11 ENCOUNTER — Encounter: Payer: Self-pay | Admitting: Podiatry

## 2019-07-11 ENCOUNTER — Encounter (HOSPITAL_COMMUNITY): Payer: Self-pay

## 2019-07-11 ENCOUNTER — Other Ambulatory Visit: Payer: Self-pay

## 2019-07-11 DIAGNOSIS — B351 Tinea unguium: Secondary | ICD-10-CM

## 2019-07-11 DIAGNOSIS — M79675 Pain in left toe(s): Secondary | ICD-10-CM

## 2019-07-11 DIAGNOSIS — D689 Coagulation defect, unspecified: Secondary | ICD-10-CM

## 2019-07-11 DIAGNOSIS — M79674 Pain in right toe(s): Secondary | ICD-10-CM

## 2019-07-11 DIAGNOSIS — I739 Peripheral vascular disease, unspecified: Secondary | ICD-10-CM

## 2019-07-11 NOTE — Progress Notes (Signed)
Today was a home visit with [name].  Brought Morry a new med box for his medications.  His medications are not fitting proplerly in his boxes.  Explained to Kay how to take his medications and appears to understand.  Meds verified and med box filled.  Will come back in 1 week to check to make sure he is taking them right.  Sylis complaint today his feet are hurting, he has a foot appt today.  Denies chest pain, headaches, dizziness or shortness of breath.  Lungs sounds clear.  Has plus 1 edema in ankles.  He states been eating ok and watching his fluids.  He sends his mems reading in next week.  Weight is same. Will continue to visit for heart failure.   [place] [phone]

## 2019-07-11 NOTE — Progress Notes (Signed)
Complaint:  Visit Type: Patient returns to my office for continued preventative foot care services. Complaint: Patient states" my nails have grown long and thick and become painful to walk and wear shoes"  The patient presents for preventative foot care services. No changes to ROS.  Patient is taking plavix.  Podiatric Exam: Vascular: dorsalis pedis and posterior tibial pulses are not  palpable bilateral.due to leg/foot swelling.   Capillary return is immediate. Temperature gradient is WNL. Skin turgor WNL  Sensorium: Normal Semmes Weinstein monofilament test. Normal tactile sensation bilaterally. Nail Exam: Pt has thick disfigured discolored nails with subungual debris noted bilateral entire nail hallux through fifth toenails Ulcer Exam: There is no evidence of ulcer or pre-ulcerative changes or infection. Orthopedic Exam: Muscle tone and strength are WNL. No limitations in general ROM. No crepitus or effusions noted. Foot type and digits show no abnormalities. Bony prominences are unremarkable. 1/2 inch limb leg discrepancy. Skin: No Porokeratosis. No infection or ulcers.    Diagnosis:  Onychomycosis, , Pain in right toe, pain in left toes,    Treatment & Plan Procedures and Treatment: Consent by patient was obtained for treatment procedures.   Debridement of mycotic and hypertrophic toenails, 1 through 5 bilateral and clearing of subungual debris. No ulceration, no infection noted. . Return  Office Procedure: Patient instructed to return to the office for a follow up visit 3 months for continued evaluation and treatment.    Gardiner Barefoot DPM

## 2019-07-14 ENCOUNTER — Other Ambulatory Visit (HOSPITAL_COMMUNITY): Payer: Self-pay

## 2019-07-14 ENCOUNTER — Encounter (HOSPITAL_COMMUNITY): Payer: Self-pay

## 2019-07-14 NOTE — Progress Notes (Signed)
Today was a visit with Carl [name] to see how he is doing taking his meds from a new medication box.  It appears he has been taking them correctly.  Filled a second box up and he is going to run out of atorvastatin.  Pharmacy says it was picked up on 11/5, his brother or his sister says they do not have it.  Looked through all of [name] medicines and can not find it.  Contacted [facility] and advised them, they do not have any samples and they are contacting his pharmacy to see what we can do.  He has about 1 week left.  Meds verified.  Carl [name] has no complaints today.  Will continue to visit for heart failure.   [place] [phone]

## 2019-07-18 ENCOUNTER — Ambulatory Visit (INDEPENDENT_AMBULATORY_CARE_PROVIDER_SITE_OTHER): Payer: Medicare Other

## 2019-07-18 DIAGNOSIS — Z95810 Presence of automatic (implantable) cardiac defibrillator: Secondary | ICD-10-CM | POA: Diagnosis not present

## 2019-07-18 DIAGNOSIS — I5022 Chronic systolic (congestive) heart failure: Secondary | ICD-10-CM | POA: Diagnosis not present

## 2019-07-18 NOTE — Progress Notes (Signed)
EPIC Encounter for ICM Monitoring  Patient Name: Carl [name] is a 68 y.o. male Date: 07/18/2019 Primary Care Physican: [name], MD Primary Cardiologist:[place] Electrophysiologist: [place] 06/16/2019 Weight: 159 lbs  Clinical Status (13-Jun-2019 to 18-Jul-2019)  AF             78  Episodes  Time in AF 0.7 hr/day (2.9%)        Transmission reviewed  OptivolThoracic impedancenormal.  Prescribed:Torsemide20mg take4tablets(80 mg total)by mouth twice a day.  Metolazone 2.5 mg take 1 tablet by mouth every Wednesday morning with 1 extra Potassium. Klor Con 90mEq take 1 tab twice a day.  Labs: 06/16/2019 Creatinine 1.66, BUN 38, Potassium 3.7, Sodium 131, GFR 4249 05/24/2019 Creatinine 1.47, BUN 20, Potassium 3.5, Sodium 128, GFR 49-56 04/14/2019 Creatinine 1.44, BUN 24, Potassium 3.7, Sodium 139, GFR 50-58 03/22/2019 Creatinine 1.45, BUN 35, Potassium 4.0, Sodium 131, GFR 49-57 03/04/2019 Creatinine1.39, BUN24, Potassium3.3, Sodium135, GFR52->60 03/03/2019 Creatinine1.37, BUN24, Potassium3.4, Sodium131, GFR53->60  02/09/2019 Creatinine1.75, BUN28, Potassium4.3, [id number], [id number]  A complete set of results can be found in Results Review  Recommendations: None.  Follow-up plan: ICM clinic phone appointment on 08/29/2019.    Office appt  09/20/2019 with Dr [name].    Copy of ICM check sent to Dr. Rayann Heman.   3 month ICM trend: 07/18/2019    1 Year ICM trend:       Rosalene Billings, RN 07/18/2019 3:32 PM

## 2019-07-20 ENCOUNTER — Telehealth (HOSPITAL_COMMUNITY): Payer: Self-pay | Admitting: *Deleted

## 2019-07-20 ENCOUNTER — Telehealth: Payer: Self-pay | Admitting: Physician Assistant

## 2019-07-20 MED ORDER — ATORVASTATIN CALCIUM 40 MG PO TABS: 40.0000 mg | ORAL_TABLET | Freq: Every day | ORAL | 0 refills | Status: DC

## 2019-07-20 NOTE — Telephone Encounter (Signed)
error 

## 2019-07-20 NOTE — Telephone Encounter (Signed)
Carl [name] called to report that pt is out of Atorvastatin however he can not get it refilled b/c the pharmacy states he picked up a 90 day supply on 11/5.  She states the family has searched all over and can not find the medication, they were going to get it and pay out of pocket however it is going to cost $65 which they do not have.  Advised will send rx into [facility] and provide gift cards for pt to use towards cost, they are agreeable and pt's sister will come pick up giftcard

## 2019-07-25 ENCOUNTER — Other Ambulatory Visit (HOSPITAL_COMMUNITY): Payer: Self-pay

## 2019-07-25 NOTE — Progress Notes (Signed)
Today was a home visit with Emery to fix his medication boxes.  Shilo complains of his legs hurting and he keeps talking about renewing his license.  It appears he has been taking his medications according to his boxes are empty.  Refilled them and verified his meds.  Called in refills for allupurinol and inspra.  Dawn denies chest pain, shortness of breath, headaches or dizziness.  We discussed diet and fluids.  His sister still does his grocery shopping and cooks some for him.  Most of time not low sodium, have talked to her about it.  No swelling noted in extremities and lungs are clear.  Abdiaziz appears to be his normal today, he has been staying home alone now.  Will continue to visit for heart failure, medication compliance and diet.   [place] [phone]

## 2019-07-28 ENCOUNTER — Ambulatory Visit (INDEPENDENT_AMBULATORY_CARE_PROVIDER_SITE_OTHER): Payer: Medicare Other | Admitting: *Deleted

## 2019-07-28 DIAGNOSIS — I255 Ischemic cardiomyopathy: Secondary | ICD-10-CM

## 2019-07-28 LAB — CUP PACEART REMOTE DEVICE CHECK
Battery Remaining Longevity: 102 mo
Battery Voltage: 2.96 V
Brady Statistic RV Percent Paced: 0.42 %
Date Time Interrogation Session: 20201203090530
HighPow Impedance: 91 Ohm
Implantable Lead Implant Date: 20160906
Implantable Lead Location: 753860
Implantable Pulse Generator Implant Date: 20160906
Lead Channel Impedance Value: 380 Ohm
Lead Channel Impedance Value: 456 Ohm
Lead Channel Pacing Threshold Amplitude: 0.625 V
Lead Channel Pacing Threshold Pulse Width: 0.4 ms
Lead Channel Sensing Intrinsic Amplitude: 14.75 mV
Lead Channel Sensing Intrinsic Amplitude: 14.75 mV
Lead Channel Setting Pacing Amplitude: 2.5 V
Lead Channel Setting Pacing Pulse Width: 0.4 ms
Lead Channel Setting Sensing Sensitivity: 0.3 mV

## 2019-08-08 ENCOUNTER — Encounter (HOSPITAL_COMMUNITY): Payer: Self-pay

## 2019-08-08 ENCOUNTER — Other Ambulatory Visit (HOSPITAL_COMMUNITY): Payer: Self-pay

## 2019-08-08 NOTE — Progress Notes (Signed)
Today had a visit with [name] at home.  He states he doing ok, he has a PCP appt today.  His meds that were called in last visit was not picked up.  Inspra and allupurinol was called in.  Allopurinol only have enough for 4 days, contacted [name] his brother and he advised will pick them up and place in his boxes.  Meds verified and med boxes filled up.  Called in refills for torsemide.  Julio Sicks that they all will be ready today after 2 pm.  Miller states his pants feels a little tight today like he has fluid.  Legs has a little bit of fluid.  No significant weight gain.  He says he ate fried chicken for supper last night.  His last Mems reading was normal.  He says his sister is fixing hot dogs for him for supper, advised him they are high in sodium.   Will talk to his brother about high sodium foods again, he states his sister been bringing him biscuits also.  Did speak to Joen about cooking again in his crock pot, he said he needs to do that again.  Will continue to try to get him to watch lower in sodium foods and will talk with his family again.  Lungs are clear, he denies headaches, dizziness, chest pain or shortness of breath.  Will continue to visit for heart failure, med compliance and diet.   [place] [phone]

## 2019-08-23 ENCOUNTER — Encounter (HOSPITAL_COMMUNITY): Payer: Self-pay

## 2019-08-23 ENCOUNTER — Other Ambulatory Visit (HOSPITAL_COMMUNITY): Payer: Self-pay

## 2019-08-23 NOTE — Progress Notes (Signed)
Today had a home visit with [name].  He had a fall this weekend chasing his dog.  He is complaining of some side pain, no bruising, swelling or redness noted.  Lung sounds are clear.  He has no other complaints today.  He is not taking his medications right, he should of had enough meds in med box til today, he ran out yesterday and his brother gave them to him.  His brother is going to start coming everyday to look at his med boxes.  Called in klor-con and protonix.  Karter has a habit of taking his meds out of box in morning and laying them in the house or sticking them in his pocket.  Filled his med boxes and verified his meds.  He has all his medications.  Swelling is down in extremities and abdomen does not feel tight.  He denies any chest pain, shortness of breath, headaches or dizziness.  Will continue to visit for heart failure, medications compliance and diet.   [place] [phone]

## 2019-08-23 NOTE — Progress Notes (Signed)
ICD remote 

## 2019-08-25 ENCOUNTER — Other Ambulatory Visit: Payer: Self-pay

## 2019-08-29 ENCOUNTER — Ambulatory Visit (INDEPENDENT_AMBULATORY_CARE_PROVIDER_SITE_OTHER): Payer: Medicare Other

## 2019-08-29 ENCOUNTER — Other Ambulatory Visit: Payer: Self-pay

## 2019-08-29 ENCOUNTER — Inpatient Hospital Stay: Payer: Medicare Other | Attending: Oncology

## 2019-08-29 DIAGNOSIS — D631 Anemia in chronic kidney disease: Secondary | ICD-10-CM | POA: Diagnosis present

## 2019-08-29 DIAGNOSIS — I5022 Chronic systolic (congestive) heart failure: Secondary | ICD-10-CM | POA: Diagnosis not present

## 2019-08-29 DIAGNOSIS — D509 Iron deficiency anemia, unspecified: Secondary | ICD-10-CM

## 2019-08-29 DIAGNOSIS — N1831 Chronic kidney disease, stage 3a: Secondary | ICD-10-CM

## 2019-08-29 DIAGNOSIS — Z95810 Presence of automatic (implantable) cardiac defibrillator: Secondary | ICD-10-CM | POA: Diagnosis not present

## 2019-08-29 LAB — CBC WITH DIFFERENTIAL/PLATELET
Abs Immature Granulocytes: 0.1 10*3/uL — ABNORMAL HIGH (ref 0.00–0.07)
Basophils Absolute: 0.1 10*3/uL (ref 0.0–0.1)
Basophils Relative: 1 %
Eosinophils Absolute: 0.2 10*3/uL (ref 0.0–0.5)
Eosinophils Relative: 2 %
HCT: 40.7 % (ref 39.0–52.0)
Hemoglobin: 13.4 g/dL (ref 13.0–17.0)
Immature Granulocytes: 1 %
Lymphocytes Relative: 9 %
Lymphs Abs: 0.8 10*3/uL (ref 0.7–4.0)
MCH: 32.6 pg (ref 26.0–34.0)
MCHC: 32.9 g/dL (ref 30.0–36.0)
MCV: 99 fL (ref 80.0–100.0)
Monocytes Absolute: 0.6 10*3/uL (ref 0.1–1.0)
Monocytes Relative: 7 %
Neutro Abs: 6.9 10*3/uL (ref 1.7–7.7)
Neutrophils Relative %: 80 %
Platelets: 232 10*3/uL (ref 150–400)
RBC: 4.11 MIL/uL — ABNORMAL LOW (ref 4.22–5.81)
RDW: 14.6 % (ref 11.5–15.5)
WBC: 8.6 10*3/uL (ref 4.0–10.5)
nRBC: 0 % (ref 0.0–0.2)

## 2019-08-29 LAB — IRON AND TIBC
Iron: 87 ug/dL (ref 45–182)
Saturation Ratios: 24 % (ref 17.9–39.5)
TIBC: 365 ug/dL (ref 250–450)
UIBC: 278 ug/dL

## 2019-08-29 LAB — FERRITIN: Ferritin: 125 ng/mL (ref 24–336)

## 2019-08-30 ENCOUNTER — Other Ambulatory Visit: Payer: Self-pay

## 2019-08-30 ENCOUNTER — Encounter: Payer: Self-pay | Admitting: Oncology

## 2019-08-30 ENCOUNTER — Inpatient Hospital Stay: Payer: Medicare Other

## 2019-08-30 ENCOUNTER — Inpatient Hospital Stay (HOSPITAL_BASED_OUTPATIENT_CLINIC_OR_DEPARTMENT_OTHER): Payer: Medicare Other | Admitting: Oncology

## 2019-08-30 VITALS — BP 99/79 | HR 65 | Temp 98.0°F | Resp 18 | Wt 164.2 lb

## 2019-08-30 DIAGNOSIS — D631 Anemia in chronic kidney disease: Secondary | ICD-10-CM | POA: Diagnosis not present

## 2019-08-30 DIAGNOSIS — D509 Iron deficiency anemia, unspecified: Secondary | ICD-10-CM

## 2019-08-30 DIAGNOSIS — N1831 Chronic kidney disease, stage 3a: Secondary | ICD-10-CM | POA: Diagnosis not present

## 2019-08-30 NOTE — Progress Notes (Signed)
Patient does not offer any problems today.  

## 2019-08-30 NOTE — Progress Notes (Signed)
Hematology/Oncology follow up  note [facility] [phone] [phone]   Patient Care Team: [name], MD as PCP - General (Internal Medicine)  REFERRING PROVIDER: [name], MD  CHIEF COMPLAINTS/REASON FOR VISIT:  Follow-up for anemia  HISTORY OF PRESENTING ILLNESS:  Carl [name] is a  69 y.o.  male with PMH listed below who was referred to me for evaluation of anemia Reviewed patient's recent labs that was done.  05/25/2019 Labs revealed anemia with hemoglobin of 8.8 Reviewed patient's previous labs  anemia is chronic onset , duration is since 2016 No aggravating or improving factors.  Associated signs and symptoms: Patient reports fatigue. denies SOB with exertion. He is a poor historian Denies weight loss, easy bruising, hematochezia, hemoptysis, hematuria. Context: History of GI bleeding: denies               History of Chronic kidney disease                               Last colonoscopy: 2018 colonoscopy showed one small polyp in the sigmoid colon, removed with a hot snare. Resected and retrieved. bleeding colonic angioectasia. Treated with argon plasma coagulation (APC). -EGD 2018 showed esophagitis.   Lives by himself. Family sister/brother bring him food.  #  CHF, LVEF 20 to 25%, history of MV repair in May 2020.  On Plavix.  And aspirin.  INTERVAL HISTORY Carl [name] is a 69 y.o. male who has above history reviewed by me today presents for follow up visit for management of Anemia Problems and complaints are listed below: Patient is a poor historian.  He was Patient recently had a fall and was found to have rib fracture. Otherwise he has no new complaints. Review of Systems  Constitutional: Negative for fatigue.  HENT:   Negative for nosebleeds.   Eyes: Negative for icterus.  Respiratory: Negative for shortness of breath.   Cardiovascular:       Right side chest wall pain  Gastrointestinal: Negative  for abdominal pain.  Endocrine: Negative for hot flashes.  Genitourinary: Negative for hematuria.   Skin: Negative for rash.  Neurological: Negative for light-headedness and numbness.  Hematological: Does not bruise/bleed easily.  Psychiatric/Behavioral: The patient is not nervous/anxious.      MEDICAL HISTORY:  Past Medical History:  Diagnosis Date  . AICD (automatic cardioverter/defibrillator) present   . Anemia   . Anxiety   . Arthritis   . Brunner's gland hyperplasia of duodenum   . CHF (congestive heart failure) ([place])   . Coronary artery disease   . External hemorrhoids   . Fracture of femoral neck, left ([place]) 07/21/2018  . GERD (gastroesophageal reflux disease)   . Hearing loss   . HH (hiatus hernia)   . Internal hemorrhoids   . Ischemic cardiomyopathy   . Leg fracture, left   . Myocardial infarction ([place])   . Paronychia   . Pneumonia   . Psoriasis   . PUD (peptic ulcer disease)   . Schatzki's ring   . Tubular adenoma of colon   . Vitiligo     SURGICAL HISTORY: Past Surgical History:  Procedure Laterality Date  . ATRIAL SEPTAL DEFECT(ASD) CLOSURE N/A 12/30/2018   Procedure: ATRIAL SEPTAL DEFECT(ASD) CLOSURE;  Surgeon: [name], MD;  Location: [place] [facility];  Service: Cardiovascular;  Laterality: N/A;  . CARDIAC SURGERY    . COLONOSCOPY WITH ESOPHAGOGASTRODUODENOSCOPY (EGD)    .  COLONOSCOPY WITH PROPOFOL N/A 08/24/2017   Procedure: COLONOSCOPY WITH PROPOFOL;  Surgeon: [name], MD;  Location: [facility] ENDOSCOPY;  Service: Endoscopy;  Laterality: N/A;  . CORONARY ARTERY BYPASS GRAFT N/A 08/03/2014   Procedure: CORONARY ARTERY BYPASS GRAFTING (CABG);  Surgeon: [name], MD;  Location: [place];  Service: Open Heart Surgery;  Laterality: N/A;  . EP IMPLANTABLE DEVICE N/A 05/01/2015   MDT ICD implanted for primary prevention of sudden death  . ESOPHAGOGASTRODUODENOSCOPY (EGD) WITH PROPOFOL N/A 04/16/2015   Procedure: ESOPHAGOGASTRODUODENOSCOPY (EGD)  WITH PROPOFOL;  Surgeon: [name], MD;  Location: [facility] ENDOSCOPY;  Service: Endoscopy;  Laterality: N/A;  . ESOPHAGOGASTRODUODENOSCOPY (EGD) WITH PROPOFOL N/A 08/24/2017   Procedure: ESOPHAGOGASTRODUODENOSCOPY (EGD) WITH PROPOFOL;  Surgeon: [name], MD;  Location: [facility] ENDOSCOPY;  Service: Endoscopy;  Laterality: N/A;  . FRACTURE SURGERY    . HERNIA REPAIR    . HIP PINNING,CANNULATED Left 07/22/2018   Procedure: CANNULATED HIP PINNING;  Surgeon: [name], MD;  Location: [place];  Service: Orthopedics;  Laterality: Left;  . MITRAL VALVE REPAIR N/A 12/30/2018   Procedure: MITRAL VALVE REPAIR;  Surgeon: [name], MD;  Location: [place] [facility];  Service: Cardiovascular;  Laterality: N/A;  . RIGHT/LEFT HEART CATH AND CORONARY/GRAFT ANGIOGRAPHY N/A 12/21/2018   Procedure: RIGHT/LEFT HEART CATH AND CORONARY/GRAFT ANGIOGRAPHY;  Surgeon: [name], MD;  Location: [place] [facility];  Service: Cardiovascular;  Laterality: N/A;  . TEE WITHOUT CARDIOVERSION N/A 08/03/2014   Procedure: TRANSESOPHAGEAL ECHOCARDIOGRAM (TEE);  Surgeon: [name], MD;  Location: [place];  Service: Open Heart Surgery;  Laterality: N/A;  . TEE WITHOUT CARDIOVERSION N/A 02/10/2018   Procedure: TRANSESOPHAGEAL ECHOCARDIOGRAM (TEE);  Surgeon: [name], MD;  Location: [facility] ENDOSCOPY;  Service: Cardiovascular;  Laterality: N/A;    SOCIAL HISTORY: Social History   Socioeconomic History  . Marital status: Single    Spouse name: Not on file  . Number of children: Not on file  . Years of education: Not on file  . Highest education level: Not on file  Occupational History  . Not on file  Tobacco Use  . Smoking status: Former Smoker    Quit date: 2014    Years since quitting: 7.0  . Smokeless tobacco: Never Used  . Tobacco comment: 07/30/2017 Quit in 2014  Substance and Sexual Activity  . Alcohol use: No    Alcohol/week: 0.0 standard drinks  . Drug use: No  . Sexual activity:  Never  Other Topics Concern  . Not on file  Social History Narrative   Pt lives in [place] with his mother.   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 Difficulty of Paying Living Expenses: Not on file  Food Insecurity:   . Worried About Charity fundraiser in the Last Year: Not on file  . Ran Out of Food in the Last Year: Not on file  Transportation Needs:   . Lack of Transportation (Medical): Not on file  . Lack of Transportation (Non-Medical): Not on file  Physical Activity:   . Days of Exercise per Week: Not on file  . Minutes of Exercise per Session: Not on file  Stress:   . Feeling of Stress : Not on file  Social Connections:   . Frequency of Communication with Friends and Family: Not on file  . Frequency of Social Gatherings with Friends and Family: Not on file  . Attends  Religious Services: Not on file  . Active Member of Clubs or Organizations: Not on file  . Attends Archivist Meetings: Not on file  . Marital Status: Not on file  Intimate Partner Violence:   . Fear of Current or Ex-Partner: Not on file  . Emotionally Abused: Not on file  . Physically Abused: Not on file  . Sexually Abused: Not on file    FAMILY HISTORY: Family History  Problem Relation Age of Onset  . Valvular heart disease Mother        Ruptured valve  . CAD Father   . Heart Problems Brother        Stents x 4  . Diabetes Brother   . Prostate cancer Neg Hx   . Bladder Cancer Neg Hx   . Kidney cancer Neg Hx     ALLERGIES:  is allergic to spironolactone.  MEDICATIONS:  Current Outpatient Medications  Medication Sig Dispense Refill  . acetaminophen (TYLENOL) 325 MG tablet Take 325 mg by mouth every 6 (six) hours as needed for mild pain or moderate pain.     Marland Kitchen allopurinol (ZYLOPRIM) 100 MG tablet Take 100 mg by mouth daily.    . Ascorbic Acid (VITAMIN C) 100 MG tablet Take 100 mg by mouth daily.    Marland Kitchen aspirin EC 81  MG tablet Take 1 tablet (81 mg total) by mouth daily. 90 tablet 3  . atorvastatin (LIPITOR) 40 MG tablet Take 1 tablet (40 mg total) by mouth daily at 6 PM. 90 tablet 0  . carvedilol (COREG) 6.25 MG tablet Take 1 tablet (6.25 mg total) by mouth 2 (two) times daily. 180 tablet 3  . Cholecalciferol (VITAMIN D3 SUPER STRENGTH) 50 MCG (2000 UT) TABS Take 2,000 Units by mouth daily.     . citalopram (CELEXA) 20 MG tablet Take 20 mg by mouth daily.   5  . cyanocobalamin 500 MCG tablet Take 500 mcg by mouth daily.    . digoxin (LANOXIN) 0.125 MG tablet TAKE 1 TABLET BY MOUTH EVERY DAY 90 tablet 3  . docusate sodium (COLACE) 100 MG capsule Take 100 mg by mouth daily.    . DR SMITHS DIAPER 10 % OINT Apply 1 application topically daily as needed (IRRITATION).     . DULoxetine (CYMBALTA) 30 MG capsule Take 30 mg by mouth daily.    Marland Kitchen eplerenone (INSPRA) 50 MG tablet Take 1 tablet (50 mg total) by mouth daily. 30 tablet 6  . ferrous sulfate 325 (65 FE) MG tablet Take 1 tablet (325 mg total) by mouth daily with breakfast. 30 tablet 3  . gabapentin (NEURONTIN) 300 MG capsule Take 300 mg by mouth daily.    Marland Kitchen ketoconazole (NIZORAL) 2 % shampoo Apply 1 application topically 2 (two) times a week.     Marland Kitchen KLOR-CON M20 20 MEQ tablet TAKE 1 TAB BY MOUTH TWICE A DAY. TAKE 1 EXTRA TABLET ON WEDNESDAY MORNING W/ METOLAZONE 180 tablet 3  . metolazone (ZAROXOLYN) 2.5 MG tablet TAKE 1 TAB BY MOUTH ONCE A WEEK. WEDNESDAY MORNINGS. TAKE 1 EXTRA POTASSIUM TABLET WITH THIS 5 tablet 11  . Multiple Vitamin (MULTIVITAMIN) tablet Take 1 tablet by mouth daily.    . pantoprazole (PROTONIX) 40 MG tablet Take 1 tablet (40 mg total) by mouth daily. 90 tablet 2  . torsemide (DEMADEX) 20 MG tablet Take 4 tablets (80 mg total) by mouth 2 (two) times daily. 240 tablet 5   No current facility-administered medications for this visit.  PHYSICAL EXAMINATION: ECOG PERFORMANCE STATUS: 2 - Symptomatic, <50% confined to bed Vitals:    08/30/19 1312  BP: 99/79  Pulse: 65  Resp: 18  Temp: 98 F (36.7 C)  SpO2: 100%   Filed Weights   08/30/19 1312  Weight: 164 lb 3.2 oz (74.5 kg)    Physical Exam Constitutional:      General: He is not in acute distress.    Appearance: He is ill-appearing.     Comments: Walks with a walker.   HENT:     Head: Normocephalic and atraumatic.  Eyes:     General: No scleral icterus.    Pupils: Pupils are equal, round, and reactive to light.  Cardiovascular:     Rate and Rhythm: Normal rate and regular rhythm.     Heart sounds: Normal heart sounds.  Pulmonary:     Effort: Pulmonary effort is normal. No respiratory distress.     Breath sounds: Normal breath sounds. No wheezing.  Abdominal:     General: Bowel sounds are normal. There is no distension.     Palpations: Abdomen is soft. There is no mass.     Tenderness: There is no abdominal tenderness.  Musculoskeletal:        General: No swelling or deformity. Normal range of motion.     Cervical back: Normal range of motion and neck supple.  Skin:    General: Skin is warm and dry.     Findings: No erythema or rash.  Neurological:     General: No focal deficit present.     Mental Status: He is alert.     Cranial Nerves: No cranial nerve deficit.     Coordination: Coordination normal.  Psychiatric:        Mood and Affect: Mood normal.      LABORATORY DATA:  I have reviewed the data as listed Lab Results  Component Value Date   WBC 8.6 08/29/2019   HGB 13.4 08/29/2019   HCT 40.7 08/29/2019   MCV 99.0 08/29/2019   PLT 232 08/29/2019   Recent Labs    02/09/19 2129 03/03/19 2010 04/14/19 1628 05/24/19 1422 06/16/19 1432  NA 127* 131* 129* 128* 131*  K 4.3 3.5 3.7 3.5 3.7  CL 92* 94* 91* 92* 87*  CO2 24 26 27 26 29   GLUCOSE 186* 120* 118* 95 120*  BUN 28* 24* 24* 20 38*  CREATININE 1.75* 1.37* 1.44* 1.47* 1.66*  CALCIUM 8.9 8.8* 9.0 8.7* 9.4  GFRNONAA 39* 53* 50* 49* 42*  GFRAA 46* >60 58* 56* 49*  PROT  7.3 7.0 7.3  --   --   ALBUMIN 3.6 3.5 4.0  --   --   AST 28 26 32  --   --   ALT 18 15 21   --   --   ALKPHOS 92 69 77  --   --   BILITOT 1.8* 1.6* 2.1*  --   --    Iron/TIBC/Ferritin/ %Sat    Component Value Date/Time   IRON 87 08/29/2019 1358   TIBC 365 08/29/2019 1358   FERRITIN 125 08/29/2019 1358   IRONPCTSAT 24 08/29/2019 1358     RADIOGRAPHIC STUDIES: I have personally reviewed the radiological images as listed and agreed with the findings in the report. CT LUMBAR SPINE W CONTRAST  Result Date: 06/20/2019 CLINICAL DATA:  Bilateral lower extremity pain and weakness. EXAM: LUMBAR MYELOGRAM FLUOROSCOPY TIME:  Radiation Exposure Index (as provided by the fluoroscopic device): 280.47 uGy*m2 PROCEDURE: After  thorough discussion of risks and benefits of the procedure including bleeding, infection, injury to nerves, blood vessels, adjacent structures as well as headache and CSF leak, written and oral informed consent was obtained. Consent was obtained by Dr. San Morelle. Time out form was completed. Patient was positioned prone on the fluoroscopy table. Local anesthesia was provided with 1% lidocaine without epinephrine after prepped and draped in the usual sterile fashion. Puncture was performed at L2-3 using a 3 1/2 inch 22-gauge spinal needle via left paramedian approach. Using a single pass through the dura, the needle was placed within the thecal sac, with return of clear CSF. 15 mL of Isovue M-200 was injected into the thecal sac, with normal opacification of the nerve roots and cauda equina consistent with free flow within the subarachnoid space. I personally performed the lumbar puncture and administered the intrathecal contrast. I also personally supervised acquisition of the myelogram images. TECHNIQUE: Contiguous axial images were obtained through the Lumbar spine after the intrathecal infusion of infusion. Coronal and sagittal reconstructions were obtained of the axial image  sets. COMPARISON:  CT lumbar spine without contrast 07/31/2016 FINDINGS: LUMBAR MYELOGRAM FINDINGS: Listhesis at L4-5 and uncovering of a broad-based disc protrusion is slightly worse with standing and flexion. No other significant listhesis is present. There is no other abnormal motion with flexion or extension. Broad-based disc protrusion at L2-3 results in mild subarticular stenosis bilaterally. Nerve roots otherwise fill normally through the lumbar spine. There is chronic loss of disc height at L5-S1. CT LUMBAR MYELOGRAM FINDINGS: Lumbar spine is imaged from the midbody of T10 through the midbody of S2. Mild leftward curvature is centered at L2-3. Vertebral body heights are maintained. A right sacral ala fracture is partially healed. There is no evidence for left-sided fracture. Lower sacral segments are not imaged. The conus medullaris terminates at L1-2. Inferior endplate Schmorl's nodes are present at T10 and T11. Limited imaging the abdomen demonstrates atherosclerotic calcifications in the distal aorta branch vessels. The bladder is mildly distended. A low-density lesion right kidney measures water density, likely a simple cyst measuring up to 15 mm. No significant adenopathy is present. T12-L1: Negative. L1-2: Mild facet hypertrophy is present bilaterally. There is no significant disc protrusion or stenosis. L2-3: A broad-based disc protrusion is present. Moderate facet hypertrophy and ligamentum flavum thickening is present. Mild subarticular narrowing is worse on the left. Disc extends into the foramina bilaterally. Mild right foraminal narrowing is present. L3-4: A broad-based disc protrusion is present. Moderate facet hypertrophy and ligamentum flavum thickening are noted bilaterally. There is no significant central canal stenosis. The disc extends into the foramina bilaterally without significant stenosis. L4-5: A broad-based disc protrusion is present. Moderate facet hypertrophy is noted bilaterally.  The disc extends into the foramina bilaterally. Mild subarticular and foraminal narrowing is present bilaterally. L5-S1: There is chronic loss of disc height. Disc extends into the foramina bilaterally. Spurring contributes to moderate left and mild right foraminal narrowing. Mild left subarticular narrowing is present. IMPRESSION: 1. Healing right sacral ala fracture. This likely contributes to the patient's right-sided hip pain. 2. Dynamic listhesis with mild subarticular and foraminal stenosis bilaterally at L4-5. The stenosis is likely worse with standing and flexion. 3. Mild right foraminal narrowing at L2-3. 4. Broad-based disc protrusion at L2-3 and L3-4 extends into the foramina bilaterally both levels without other significant stenosis. 5. Moderate left and mild right foraminal stenosis at L5-S1 secondary to chronic broad-based disc protrusion and facet hypertrophy. Mild left subarticular narrowing is present  as well. 6.  Aortic Atherosclerosis (ICD10-I70.0). Electronically Signed   By: San Morelle M.D.   On: 06/20/2019 15:03   DG MYELOGRAPHY LUMBAR INJ LUMBOSACRAL  Result Date: 06/20/2019 CLINICAL DATA:  Bilateral lower extremity pain and weakness. EXAM: LUMBAR MYELOGRAM FLUOROSCOPY TIME:  Radiation Exposure Index (as provided by the fluoroscopic device): 280.47 uGy*m2 PROCEDURE: After thorough discussion of risks and benefits of the procedure including bleeding, infection, injury to nerves, blood vessels, adjacent structures as well as headache and CSF leak, written and oral informed consent was obtained. Consent was obtained by Dr. San Morelle. Time out form was completed. Patient was positioned prone on the fluoroscopy table. Local anesthesia was provided with 1% lidocaine without epinephrine after prepped and draped in the usual sterile fashion. Puncture was performed at L2-3 using a 3 1/2 inch 22-gauge spinal needle via left paramedian approach. Using a single pass through the  dura, the needle was placed within the thecal sac, with return of clear CSF. 15 mL of Isovue M-200 was injected into the thecal sac, with normal opacification of the nerve roots and cauda equina consistent with free flow within the subarachnoid space. I personally performed the lumbar puncture and administered the intrathecal contrast. I also personally supervised acquisition of the myelogram images. TECHNIQUE: Contiguous axial images were obtained through the Lumbar spine after the intrathecal infusion of infusion. Coronal and sagittal reconstructions were obtained of the axial image sets. COMPARISON:  CT lumbar spine without contrast 07/31/2016 FINDINGS: LUMBAR MYELOGRAM FINDINGS: Listhesis at L4-5 and uncovering of a broad-based disc protrusion is slightly worse with standing and flexion. No other significant listhesis is present. There is no other abnormal motion with flexion or extension. Broad-based disc protrusion at L2-3 results in mild subarticular stenosis bilaterally. Nerve roots otherwise fill normally through the lumbar spine. There is chronic loss of disc height at L5-S1. CT LUMBAR MYELOGRAM FINDINGS: Lumbar spine is imaged from the midbody of T10 through the midbody of S2. Mild leftward curvature is centered at L2-3. Vertebral body heights are maintained. A right sacral ala fracture is partially healed. There is no evidence for left-sided fracture. Lower sacral segments are not imaged. The conus medullaris terminates at L1-2. Inferior endplate Schmorl's nodes are present at T10 and T11. Limited imaging the abdomen demonstrates atherosclerotic calcifications in the distal aorta branch vessels. The bladder is mildly distended. A low-density lesion right kidney measures water density, likely a simple cyst measuring up to 15 mm. No significant adenopathy is present. T12-L1: Negative. L1-2: Mild facet hypertrophy is present bilaterally. There is no significant disc protrusion or stenosis. L2-3: A  broad-based disc protrusion is present. Moderate facet hypertrophy and ligamentum flavum thickening is present. Mild subarticular narrowing is worse on the left. Disc extends into the foramina bilaterally. Mild right foraminal narrowing is present. L3-4: A broad-based disc protrusion is present. Moderate facet hypertrophy and ligamentum flavum thickening are noted bilaterally. There is no significant central canal stenosis. The disc extends into the foramina bilaterally without significant stenosis. L4-5: A broad-based disc protrusion is present. Moderate facet hypertrophy is noted bilaterally. The disc extends into the foramina bilaterally. Mild subarticular and foraminal narrowing is present bilaterally. L5-S1: There is chronic loss of disc height. Disc extends into the foramina bilaterally. Spurring contributes to moderate left and mild right foraminal narrowing. Mild left subarticular narrowing is present. IMPRESSION: 1. Healing right sacral ala fracture. This likely contributes to the patient's right-sided hip pain. 2. Dynamic listhesis with mild subarticular and foraminal stenosis bilaterally at L4-5.  The stenosis is likely worse with standing and flexion. 3. Mild right foraminal narrowing at L2-3. 4. Broad-based disc protrusion at L2-3 and L3-4 extends into the foramina bilaterally both levels without other significant stenosis. 5. Moderate left and mild right foraminal stenosis at L5-S1 secondary to chronic broad-based disc protrusion and facet hypertrophy. Mild left subarticular narrowing is present as well. 6.  Aortic Atherosclerosis (ICD10-I70.0). Electronically Signed   By: San Morelle M.D.   On: 06/20/2019 15:03   CUP PACEART REMOTE DEVICE CHECK  Result Date: 07/28/2019 Scheduled remote reviewed.  Normal device function.  18.9 % suggested AF burden EGM review shows SR with freq. ectopy vs. AF. previous reviews determined false AF.  Sent for review since increase in frequency of events Next  remote 91 days. Kathy Breach, RN, CCDS    ASSESSMENT & PLAN:  1. Anemia in stage 3a chronic kidney disease   2. Iron deficiency anemia, unspecified iron deficiency anemia type    #Lab results were reviewed and discussed with patient.  I also called patient's brother [name] and updated him. Iron panel as well as hemoglobin level have both improved and normalized. No need for additional IV iron at this point. Etiology of iron deficiency, is likely due to GI blood loss. I have discussed with patient and [facility] and patient will be scheduled for gastroenterology work-up after cardiology clearance is obtained. Continue oral iron supplementation.  patient's brother [name] was called and updated about the plan of care.  He agrees with the plan. Follow-up in 6 months  Orders Placed This Encounter  Procedures  . CBC with Differential    Standing Status:   Future    Standing Expiration Date:   08/29/2020  . Ferritin    Standing Status:   Future    Standing Expiration Date:   08/29/2020  . Iron and TIBC    Standing Status:   Future    Standing Expiration Date:   08/29/2020    All questions were answered. The patient knows to call the clinic with any problems questions or concerns. Ollen Gross, MD   Earlie Server, MD, PhD 08/30/2019

## 2019-08-31 NOTE — Progress Notes (Signed)
EPIC Encounter for ICM Monitoring  Patient Name: Carl [name] is a 69 y.o. male Date: 08/31/2019 Primary Care Physican: [name], MD Primary Cardiologist:[place] Electrophysiologist: Allred 06/16/2019 Weight: 159 lbs  Clinical Status (13-Jun-2019 to 18-Jul-2019)  AF                     78  Episodes  Time in AF        0.7 hr/day (2.9%)        Transmission reviewed  OptivolThoracic impedancenormal.  Prescribed:Torsemide20mg take4tablets(80 mg total)by mouth twice a day.  Metolazone 2.5 mg take 1 tablet by mouth every Wednesday morning with 1 extra Potassium. Klor Con 46mEq take 1 tab twice a day.  Labs: 06/16/2019 Creatinine 1.66, BUN 38, Potassium 3.7, Sodium 131, GFR 4249 05/24/2019 Creatinine 1.47, BUN 20, Potassium 3.5, Sodium 128, GFR 49-56 04/14/2019 Creatinine 1.44, BUN 24, Potassium 3.7, Sodium 139, GFR 50-58 03/22/2019 Creatinine 1.45, BUN 35, Potassium 4.0, Sodium 131, GFR 49-57 03/04/2019 Creatinine1.39, BUN24, Potassium3.3, Sodium135, GFR52->60 03/03/2019 Creatinine1.37, BUN24, Potassium3.4, Sodium131, GFR53->60  02/09/2019 Creatinine1.75, BUN28, Potassium4.3, [id number], [id number]  A complete set of results can be found in Results Review  Recommendations:None.  Follow-up plan: ICM clinic phone appointment on2/03/2020. 91 day device clinic remote transmission 10/27/2019.Office appt  1/26/2021with Dr [name].   Copy of ICM check sent to Dr.Allred.   3 month ICM trend: 08/29/2019    1 Year ICM trend:       Rosalene Billings, RN 08/31/2019 5:13 PM

## 2019-09-05 ENCOUNTER — Other Ambulatory Visit (HOSPITAL_COMMUNITY): Payer: Self-pay

## 2019-09-05 ENCOUNTER — Encounter (HOSPITAL_COMMUNITY): Payer: Self-pay

## 2019-09-05 NOTE — Progress Notes (Signed)
Today had a visit with Carl [name].  He states doing ok except where he fell in yard a few weeks, he is still sore.  He has no swelling in extremities, belly is not tight.  Lungs are clear, no difficulty breathing.  He is ambulating around the house good.  Discussed diet, he states he dont always eat good.  His sister [name] takes him out to unhealthy eating places, she has been talked to but she states he is happy eating those things.  He states has cut back on Pepsi and drinking more water.  It appears he is taking his medications according to his pill boxes.  Refilled his med boxes and verified his meds.  Called in refills for digoxin, epelerenone, and pantoprazole.   His weight has went down a couple of pounds, he has been gaining.  Will continue to visit for heart failure, diet and medication management.   [place] [phone]

## 2019-09-06 ENCOUNTER — Other Ambulatory Visit (HOSPITAL_COMMUNITY): Payer: Self-pay | Admitting: Cardiology

## 2019-09-14 ENCOUNTER — Other Ambulatory Visit (HOSPITAL_COMMUNITY): Payer: Self-pay | Admitting: Cardiology

## 2019-09-19 ENCOUNTER — Telehealth (HOSPITAL_COMMUNITY): Payer: Self-pay

## 2019-09-19 NOTE — Telephone Encounter (Signed)

## 2019-09-20 ENCOUNTER — Ambulatory Visit (HOSPITAL_COMMUNITY)
Admission: RE | Admit: 2019-09-20 | Discharge: 2019-09-20 | Disposition: A | Discharge status: Home / Self Care | Payer: Medicare Other | Source: Ambulatory Visit | Attending: Cardiology | Admitting: Cardiology

## 2019-09-20 ENCOUNTER — Other Ambulatory Visit (HOSPITAL_COMMUNITY): Payer: Self-pay

## 2019-09-20 ENCOUNTER — Other Ambulatory Visit: Payer: Self-pay

## 2019-09-20 ENCOUNTER — Encounter (HOSPITAL_COMMUNITY): Payer: Self-pay | Admitting: Cardiology

## 2019-09-20 ENCOUNTER — Encounter (HOSPITAL_COMMUNITY): Payer: Self-pay

## 2019-09-20 VITALS — BP 102/60 | HR 59 | Wt 169.6 lb

## 2019-09-20 DIAGNOSIS — Z87891 Personal history of nicotine dependence: Secondary | ICD-10-CM | POA: Insufficient documentation

## 2019-09-20 DIAGNOSIS — R9431 Abnormal electrocardiogram [ECG] [EKG]: Secondary | ICD-10-CM | POA: Diagnosis not present

## 2019-09-20 DIAGNOSIS — N183 Chronic kidney disease, stage 3 unspecified: Secondary | ICD-10-CM | POA: Diagnosis not present

## 2019-09-20 DIAGNOSIS — F70 Mild intellectual disabilities: Secondary | ICD-10-CM | POA: Insufficient documentation

## 2019-09-20 DIAGNOSIS — I052 Rheumatic mitral stenosis with insufficiency: Secondary | ICD-10-CM | POA: Diagnosis not present

## 2019-09-20 DIAGNOSIS — I4510 Unspecified right bundle-branch block: Secondary | ICD-10-CM | POA: Insufficient documentation

## 2019-09-20 DIAGNOSIS — I255 Ischemic cardiomyopathy: Secondary | ICD-10-CM | POA: Insufficient documentation

## 2019-09-20 DIAGNOSIS — Z951 Presence of aortocoronary bypass graft: Secondary | ICD-10-CM | POA: Insufficient documentation

## 2019-09-20 DIAGNOSIS — Z7982 Long term (current) use of aspirin: Secondary | ICD-10-CM | POA: Diagnosis not present

## 2019-09-20 DIAGNOSIS — I493 Ventricular premature depolarization: Secondary | ICD-10-CM

## 2019-09-20 DIAGNOSIS — Z833 Family history of diabetes mellitus: Secondary | ICD-10-CM | POA: Diagnosis not present

## 2019-09-20 DIAGNOSIS — Z8249 Family history of ischemic heart disease and other diseases of the circulatory system: Secondary | ICD-10-CM | POA: Insufficient documentation

## 2019-09-20 DIAGNOSIS — F329 Major depressive disorder, single episode, unspecified: Secondary | ICD-10-CM | POA: Insufficient documentation

## 2019-09-20 DIAGNOSIS — I252 Old myocardial infarction: Secondary | ICD-10-CM | POA: Diagnosis not present

## 2019-09-20 DIAGNOSIS — I5022 Chronic systolic (congestive) heart failure: Secondary | ICD-10-CM | POA: Diagnosis not present

## 2019-09-20 DIAGNOSIS — I25810 Atherosclerosis of coronary artery bypass graft(s) without angina pectoris: Secondary | ICD-10-CM | POA: Insufficient documentation

## 2019-09-20 DIAGNOSIS — Z79899 Other long term (current) drug therapy: Secondary | ICD-10-CM | POA: Insufficient documentation

## 2019-09-20 DIAGNOSIS — Z7902 Long term (current) use of antithrombotics/antiplatelets: Secondary | ICD-10-CM | POA: Insufficient documentation

## 2019-09-20 DIAGNOSIS — I739 Peripheral vascular disease, unspecified: Secondary | ICD-10-CM

## 2019-09-20 LAB — BASIC METABOLIC PANEL
Anion gap: 10 (ref 5–15)
BUN: 19 mg/dL (ref 8–23)
CO2: 30 mmol/L (ref 22–32)
Calcium: 9.1 mg/dL (ref 8.9–10.3)
Chloride: 95 mmol/L — ABNORMAL LOW (ref 98–111)
Creatinine, Ser: 1.43 mg/dL — ABNORMAL HIGH (ref 0.61–1.24)
GFR calc Af Amer: 58 mL/min — ABNORMAL LOW (ref >60)
GFR calc non Af Amer: 50 mL/min — ABNORMAL LOW (ref >60)
Glucose, Bld: 150 mg/dL — ABNORMAL HIGH (ref 70–99)
Potassium: 3.5 mmol/L (ref 3.5–5.1)
Sodium: 135 mmol/L (ref 135–145)

## 2019-09-20 LAB — LIPID PANEL
Cholesterol: 138 mg/dL (ref 0–200)
HDL: 48 mg/dL (ref >40)
LDL Cholesterol: 78 mg/dL (ref 0–99)
Total CHOL/HDL Ratio: 2.9 RATIO
Triglycerides: 61 mg/dL (ref <150)
VLDL: 12 mg/dL (ref 0–40)

## 2019-09-20 LAB — DIGOXIN LEVEL: Digoxin Level: 1.1 ng/mL (ref 0.8–2.0)

## 2019-09-20 MED ORDER — LOSARTAN POTASSIUM 25 MG PO TABS: 12.5000 mg | ORAL_TABLET | Freq: Every day | ORAL | 3 refills | Status: DC

## 2019-09-20 NOTE — Progress Notes (Signed)
Today had a home visit with Carl [name] to place his meds in his boxes.  He has all his medications.  He missed a dose of digoxin last week, I showed it to him.  He did not know how he missed it.  Meds were verified.  Discussed diet and fluids.  He had 3 packs of pepsi sitting in his kitchen today.  Discussed how important water is than pepsi.  Still have a time with his diet, his sister still takes him out to eat where he eats fried foods such as fish, sausage biscuits etc.   He has no swelling in lower extremities today.  Lungs were clear. He denies chest pain, headaches or dizziness.  No shortness of breath.  He states his leg hurts and showed me a skint place.  Has a scab on it and no redness.  He still complains of some pain in rib area where he fell in the yard in December.  He has cardiology appt today, will check for med changes after this appt.  Will continue to visit for heart failure, medication compliance and diet.   Carl [name] [phone]

## 2019-09-20 NOTE — Patient Instructions (Signed)
START Losartan 12.5mg  (1/2 tab) daily   Labs today and repeat in 10 days We will only contact you if something comes back abnormal or we need to make some changes. Otherwise no news is good news!   Your provider has recommended that  you wear a Zio Patch for 3 days.  This monitor will record your heart rhythm for our review.  IF you have any symptoms while wearing the monitor please press the button.  If you have any issues with the patch or you notice a red or orange light on it please call the company at [phone].  Once you remove the patch please mail it back to the company as soon as possible so we can get the results.  You were ordered to have an ultrasound of your legs. Radiology will call you to schedule this appointment.    Your physician recommends that you schedule a follow-up appointment in: 3 weeks with the Pharmacist   Your physician recommends that you schedule a follow-up appointment in: 6 week with Dr [name].    Please call office at [phone] option 2 if you have any questions or concerns.   At the [facility], you and your health needs are our priority. As part of our continuing mission to provide you with exceptional heart care, we have created designated Provider Care Teams. These Care Teams include your primary Cardiologist (physician) and Advanced Practice Providers (APPs- Physician Assistants and Nurse Practitioners) who all work together to provide you with the care you need, when you need it.   You may see any of the following providers on your designated Care Team at your next follow up: Marland Kitchen Dr [name] . Dr [name] . Darrick Grinder, NP . Lyda Jester, PA . Audry Riles, PharmD   Please be sure to bring in all your medications bottles to every appointment.

## 2019-09-20 NOTE — Progress Notes (Signed)
Zio patch placed onto patient by CMA Philicia.  All instructions and information reviewed with patient, they verbalize understanding with no questions.

## 2019-09-21 ENCOUNTER — Telehealth (HOSPITAL_COMMUNITY): Payer: Self-pay

## 2019-09-21 ENCOUNTER — Other Ambulatory Visit: Payer: Self-pay | Admitting: Student

## 2019-09-21 DIAGNOSIS — K7460 Unspecified cirrhosis of liver: Secondary | ICD-10-CM

## 2019-09-21 DIAGNOSIS — I5022 Chronic systolic (congestive) heart failure: Secondary | ICD-10-CM

## 2019-09-21 NOTE — Telephone Encounter (Signed)
Pts brother aware, will hold am dose of dig before lab appt. Advised to resume after lab work.  Verbalized understanding.

## 2019-09-21 NOTE — Telephone Encounter (Signed)
-----   Message from [name], MD sent at 09/20/2019  4:28 PM EST ----- Labs ok except digoxin mildly elevated.  When he returns for next labs, add on digoxin level but check trough level before he takes his am dose.
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
## 2019-09-26 ENCOUNTER — Other Ambulatory Visit (HOSPITAL_COMMUNITY): Payer: Self-pay | Admitting: Cardiology

## 2019-09-26 DIAGNOSIS — I739 Peripheral vascular disease, unspecified: Secondary | ICD-10-CM

## 2019-09-27 ENCOUNTER — Other Ambulatory Visit: Payer: Self-pay

## 2019-09-27 ENCOUNTER — Ambulatory Visit (HOSPITAL_COMMUNITY)
Admission: RE | Admit: 2019-09-27 | Discharge: 2019-09-27 | Disposition: A | Discharge status: Home / Self Care | Payer: Medicare Other | Source: Ambulatory Visit | Attending: Internal Medicine | Admitting: Internal Medicine

## 2019-09-27 DIAGNOSIS — I739 Peripheral vascular disease, unspecified: Secondary | ICD-10-CM

## 2019-09-28 ENCOUNTER — Ambulatory Visit (HOSPITAL_COMMUNITY)
Admission: RE | Admit: 2019-09-28 | Discharge: 2019-09-28 | Disposition: A | Discharge status: Home / Self Care | Payer: Medicare Other | Source: Ambulatory Visit | Attending: Internal Medicine | Admitting: Internal Medicine

## 2019-09-28 DIAGNOSIS — I5022 Chronic systolic (congestive) heart failure: Secondary | ICD-10-CM | POA: Diagnosis not present

## 2019-09-28 LAB — BASIC METABOLIC PANEL
Anion gap: 12 (ref 5–15)
BUN: 39 mg/dL — ABNORMAL HIGH (ref 8–23)
CO2: 29 mmol/L (ref 22–32)
Calcium: 9.4 mg/dL (ref 8.9–10.3)
Chloride: 91 mmol/L — ABNORMAL LOW (ref 98–111)
Creatinine, Ser: 1.94 mg/dL — ABNORMAL HIGH (ref 0.61–1.24)
GFR calc Af Amer: 40 mL/min — ABNORMAL LOW (ref >60)
GFR calc non Af Amer: 35 mL/min — ABNORMAL LOW (ref >60)
Glucose, Bld: 97 mg/dL (ref 70–99)
Potassium: 4.4 mmol/L (ref 3.5–5.1)
Sodium: 132 mmol/L — ABNORMAL LOW (ref 135–145)

## 2019-09-28 LAB — DIGOXIN LEVEL: Digoxin Level: 1.3 ng/mL (ref 0.8–2.0)

## 2019-09-29 ENCOUNTER — Telehealth (HOSPITAL_COMMUNITY): Payer: Self-pay

## 2019-09-29 DIAGNOSIS — I5022 Chronic systolic (congestive) heart failure: Secondary | ICD-10-CM

## 2019-09-29 MED ORDER — DIGOXIN 125 MCG PO TABS: 0.0625 mg | ORAL_TABLET | Freq: Every day | ORAL | 3 refills | Status: DC

## 2019-09-29 MED ORDER — TORSEMIDE 20 MG PO TABS: 60.0000 mg | ORAL_TABLET | Freq: Two times a day (BID) | ORAL | 5 refills | Status: DC

## 2019-09-29 NOTE — Telephone Encounter (Signed)
-----   Message from [name], MD sent at 09/28/2019  4:16 PM EST ----- Decrease digoxin to 0.0625 mg daily.  Decrease torsemide to 60 mg bid.  BMET and digoxin level next week.

## 2019-09-30 ENCOUNTER — Other Ambulatory Visit (HOSPITAL_COMMUNITY): Payer: Self-pay

## 2019-09-30 ENCOUNTER — Other Ambulatory Visit (HOSPITAL_COMMUNITY): Payer: Medicare Other

## 2019-10-03 ENCOUNTER — Telehealth: Payer: Self-pay

## 2019-10-03 ENCOUNTER — Ambulatory Visit (INDEPENDENT_AMBULATORY_CARE_PROVIDER_SITE_OTHER): Payer: Medicare Other

## 2019-10-03 DIAGNOSIS — I5022 Chronic systolic (congestive) heart failure: Secondary | ICD-10-CM | POA: Diagnosis not present

## 2019-10-03 DIAGNOSIS — Z95810 Presence of automatic (implantable) cardiac defibrillator: Secondary | ICD-10-CM

## 2019-10-03 NOTE — Telephone Encounter (Signed)
Spoke with brother, DPR and denies patient has had fluid symptoms.  Patient's sister takes patient out to eat about 4 times a week.      OptivolThoracic impedancesuggesting possible ongoing fluid accumulation since 09/21/2019.  Prescribed:  Torsemide20mg take3tablets(60 mg total)by mouth twice a day (decreased on 09/29/19).  Metolazone 2.5 mg take 1 tablet by mouth every Wednesday morning with 1 extra Potassium.   Klor Con 65mEq take 1 tab twice a day.  TAKE 1 EXTRA TABLET ON WEDNESDAY MORNING W/ METOLAZONE  BMET is scheduled for 2/11 09/28/2019 Creatinine 1.94, BUN 39, Potassium 4.4, Sodium 132, GFR 35-40 09/20/2019 Creatinine 1.43, BUN 19, Potassium 3.5, Sodium 135, GFR 50-58   Follow-up plan: ICM clinic phone appointment on2/15/2021 recheck fluid levels.   Sent to Dr [name] for review and recommendations if needed.   3 month ICM trend: 10/03/2019    1 Year ICM trend:

## 2019-10-03 NOTE — Progress Notes (Signed)
EPIC Encounter for ICM Monitoring  Patient Name: Carl [name] is a 69 y.o. male Date: 10/03/2019 Primary Care Physican: [name], MD Primary Cardiologist:[place] Electrophysiologist: Allred 09/20/2019 Office Weight: 169 lbs  Clinical Status (29-Aug-2019 to 30-Sep-2019)   AF              126 episodes  Time in AF  1.8 hr/day (7.5%)       Spoke with brother, DPR.  He reports pt has not complained about any fluid symptoms.  Patient's sister takes patient out to eat about 4 times a week.  Advised restaurant foods are high in salt and should try to avoid if possible.    OptivolThoracic impedancesuggesting possible ongoing fluid accumulation since 09/21/2019.  Prescribed:  Torsemide20mg take3tablets(60 mg total)by mouth twice a day (decreased on 09/29/19).  Metolazone 2.5 mg take 1 tablet by mouth every Wednesday morning with 1 extra Potassium.   Klor Con 31mEq take 1 tab twice a day.  TAKE 1 EXTRA TABLET ON WEDNESDAY MORNING W/ METOLAZONE  Labs:  BMET is scheduled for 2/11 09/28/2019 Creatinine 1.94, BUN 39, Potassium 4.4, Sodium 132, GFR 35-40 09/20/2019 Creatinine 1.43, BUN 19, Potassium 3.5, Sodium 135, GFR 50-58  06/16/2019 Creatinine 1.66, BUN 38, Potassium 3.7, Sodium 131, GFR 4249 05/24/2019 Creatinine 1.47, BUN 20, Potassium 3.5, Sodium 128, GFR 49-56 04/14/2019 Creatinine 1.44, BUN 24, Potassium 3.7, Sodium 139, GFR 50-58 03/22/2019 Creatinine 1.45, BUN 35, Potassium 4.0, Sodium 131, GFR 49-57 03/04/2019 Creatinine1.39, BUN24, Potassium3.3, Sodium135, GFR52->60 03/03/2019 Creatinine1.37, BUN24, Potassium3.4, Sodium131, GFR53->60  02/09/2019 Creatinine1.75, BUN28, Potassium4.3, [id number], [id number]  A complete set of results can be found in Results Review  Recommendations: Phone note sent to Dr [name] for review and recommendations if needed.   Follow-up plan: ICM clinic phone appointment on2/15/2021 recheck  fluid levels. 91 day device clinic remote transmission 10/27/2019.  Copy of ICM check sent to Dr.Allred.  3 month ICM trend: 10/03/2019    1 Year ICM trend:       Rosalene Billings, RN 10/03/2019 1:20 PM

## 2019-10-03 NOTE — Telephone Encounter (Signed)
Increase torsemide to 80 mg bid with BMET 1 week.

## 2019-10-03 NOTE — Progress Notes (Signed)
Today was a home visti with Carl [name] to fix his medications.  He had some med changes from cardiology.  Chsnged his digoxin and torsemide in his boxes.  Refilled 2 weeks worth.  1 refilled called in for allupurinol.  [name] appears to be having a good day.  He is aware of up coming appts.  He denies any chest pain, headaches, dizziness or shortness of breath.  Will continue to visit for heart failure.  [place] [phone]

## 2019-10-04 MED ORDER — TORSEMIDE 20 MG PO TABS: 80.0000 mg | ORAL_TABLET | Freq: Two times a day (BID) | ORAL | 2 refills | Status: DC

## 2019-10-04 NOTE — Telephone Encounter (Signed)
Spoke with [name], community Paramedicine EMT and advised Dr [name] recommended to increase Torsemide to 80 mg bid with BMET next week.  She said she will go to patient's house and change the Torsemide dosage in the pill box.  She reports patient eats a lot of restaurant foods even though she has discussed with patient and family these foods are high in salt.  She verbalized instructions for new dosage.  Will contact patient's brother regarding recommendations.

## 2019-10-04 NOTE — Progress Notes (Signed)
Spoke with [name], community Paramedicine EMT and advised Dr [name] recommended to increase Torsemide to 80 mg bid with BMET next week.  She said she will go to patient's house and change the Torsemide dosage in the pill box.  She reports patient eats a lot of restaurant foods even though she has discussed with patient and family these foods are high in salt.  She verbalized instructions for new dosage.  Will contact patient's brother regarding recommendations.

## 2019-10-04 NOTE — Telephone Encounter (Signed)
Spoke with brother, Deandra [place], [place].  Advised Dr [name] recommended to increase Torsemide to 80 mg bid and BMET in a week.  Advised I spoke with [name], [employer] and she will change patients dosage in his pill box today.  BMET scheduled on 10/11/19 at 1:45 PM which is 15 minutes prior to the HF clinic pharmacy appointment and brother aware of scheduled lab date.

## 2019-10-04 NOTE — Progress Notes (Signed)
[name], MD  Physician  Specialty:  Cardiology  Telephone Encounter  Signed  Creation Time:  10/03/2019 5:10 PM          Signed        Increase torsemide to 80 mg bid with BMET 1 week.

## 2019-10-04 NOTE — Progress Notes (Signed)
Spoke with brother, Aamari [place], [place].  Advised Dr [name] recommended to increase Torsemide to 80 mg bid and BMET in a week.  Advised I spoke with [name], [employer] and she will change patients dosage in his pill box today.  BMET scheduled on 10/11/19 at 1:45 PM which is 15 minutes prior to the HF clinic pharmacy appointment and brother aware of scheduled lab date.

## 2019-10-06 ENCOUNTER — Ambulatory Visit (HOSPITAL_COMMUNITY)
Admission: RE | Admit: 2019-10-06 | Discharge: 2019-10-06 | Disposition: A | Discharge status: Home / Self Care | Payer: Medicare Other | Source: Ambulatory Visit | Attending: Cardiology | Admitting: Cardiology

## 2019-10-06 ENCOUNTER — Other Ambulatory Visit: Payer: Self-pay

## 2019-10-06 DIAGNOSIS — I5022 Chronic systolic (congestive) heart failure: Secondary | ICD-10-CM | POA: Diagnosis not present

## 2019-10-06 LAB — BASIC METABOLIC PANEL
Anion gap: 12 (ref 5–15)
BUN: 28 mg/dL — ABNORMAL HIGH (ref 8–23)
CO2: 29 mmol/L (ref 22–32)
Calcium: 8.9 mg/dL (ref 8.9–10.3)
Chloride: 92 mmol/L — ABNORMAL LOW (ref 98–111)
Creatinine, Ser: 2.71 mg/dL — ABNORMAL HIGH (ref 0.61–1.24)
GFR calc Af Amer: 27 mL/min — ABNORMAL LOW (ref >60)
GFR calc non Af Amer: 23 mL/min — ABNORMAL LOW (ref >60)
Glucose, Bld: 119 mg/dL — ABNORMAL HIGH (ref 70–99)
Potassium: 3.7 mmol/L (ref 3.5–5.1)
Sodium: 133 mmol/L — ABNORMAL LOW (ref 135–145)

## 2019-10-06 LAB — DIGOXIN LEVEL: Digoxin Level: 1.2 ng/mL (ref 0.8–2.0)

## 2019-10-10 ENCOUNTER — Ambulatory Visit (INDEPENDENT_AMBULATORY_CARE_PROVIDER_SITE_OTHER): Payer: Medicare Other | Admitting: Podiatry

## 2019-10-10 ENCOUNTER — Other Ambulatory Visit: Payer: Self-pay

## 2019-10-10 ENCOUNTER — Encounter: Payer: Self-pay | Admitting: Podiatry

## 2019-10-10 ENCOUNTER — Ambulatory Visit: Payer: Medicare Other | Admitting: Podiatry

## 2019-10-10 ENCOUNTER — Other Ambulatory Visit (HOSPITAL_COMMUNITY): Payer: Self-pay

## 2019-10-10 ENCOUNTER — Ambulatory Visit (INDEPENDENT_AMBULATORY_CARE_PROVIDER_SITE_OTHER): Payer: Medicare Other

## 2019-10-10 DIAGNOSIS — M79675 Pain in left toe(s): Secondary | ICD-10-CM | POA: Diagnosis not present

## 2019-10-10 DIAGNOSIS — I5022 Chronic systolic (congestive) heart failure: Secondary | ICD-10-CM

## 2019-10-10 DIAGNOSIS — M79674 Pain in right toe(s): Secondary | ICD-10-CM

## 2019-10-10 DIAGNOSIS — D689 Coagulation defect, unspecified: Secondary | ICD-10-CM

## 2019-10-10 DIAGNOSIS — I739 Peripheral vascular disease, unspecified: Secondary | ICD-10-CM | POA: Diagnosis not present

## 2019-10-10 DIAGNOSIS — B351 Tinea unguium: Secondary | ICD-10-CM | POA: Diagnosis not present

## 2019-10-10 DIAGNOSIS — Z95810 Presence of automatic (implantable) cardiac defibrillator: Secondary | ICD-10-CM

## 2019-10-10 NOTE — Progress Notes (Signed)
Complaint:  Visit Type: Patient returns to my office for continued preventative foot care services. Complaint: Patient states" my nails have grown long and thick and become painful to walk and wear shoes"  The patient presents for preventative foot care services. No changes to ROS.  Patient is taking plavix.  Podiatric Exam: Vascular: dorsalis pedis and posterior tibial pulses are not  palpable bilateral.due to leg/foot swelling.   Capillary return is immediate. Temperature gradient is WNL. Skin turgor WNL  Sensorium: Normal Semmes Weinstein monofilament test. Normal tactile sensation bilaterally. Nail Exam: Pt has thick disfigured discolored nails with subungual debris noted bilateral entire nail hallux through fifth toenails Ulcer Exam: There is no evidence of ulcer or pre-ulcerative changes or infection. Orthopedic Exam: Muscle tone and strength are WNL. No limitations in general ROM. No crepitus or effusions noted. Foot type and digits show no abnormalities. Bony prominences are unremarkable. 1/2 inch limb leg discrepancy. Skin: No Porokeratosis. No infection or ulcers.    Diagnosis:  Onychomycosis, , Pain in right toe, pain in left toes,    Treatment & Plan Procedures and Treatment: Consent by patient was obtained for treatment procedures.   Debridement of mycotic and hypertrophic toenails, 1 through 5 bilateral and clearing of subungual debris. No ulceration, no infection noted. .Skin allergiic reaction right leg anteriorally. Return  Office Procedure: Patient instructed to return to the office for a follow up visit 3 months for continued evaluation and treatment.    Gardiner Barefoot DPM

## 2019-10-10 NOTE — Progress Notes (Signed)
Today received a phone call from Heart Failure clinic in [place] stating Carl [name] has a med change.  Took out digoxin and torsemide for next 2 days, after that decrease torsemide to 60 mg twice a day and digoxin should stay at  0.0625 once a day.(half a tablet).  Changed meds in med box.    [place] [phone]

## 2019-10-11 ENCOUNTER — Other Ambulatory Visit (HOSPITAL_COMMUNITY): Payer: Medicare Other

## 2019-10-11 ENCOUNTER — Inpatient Hospital Stay (HOSPITAL_COMMUNITY): Admission: RE | Admit: 2019-10-11 | Payer: Medicare Other | Source: Ambulatory Visit

## 2019-10-11 ENCOUNTER — Telehealth (HOSPITAL_COMMUNITY): Payer: Self-pay | Admitting: Pharmacist

## 2019-10-11 ENCOUNTER — Ambulatory Visit
Admission: RE | Admit: 2019-10-11 | Discharge: 2019-10-11 | Disposition: A | Discharge status: Home / Self Care | Payer: Medicare Other | Source: Ambulatory Visit | Attending: Student | Admitting: Student

## 2019-10-11 ENCOUNTER — Telehealth: Payer: Self-pay

## 2019-10-11 DIAGNOSIS — K7460 Unspecified cirrhosis of liver: Secondary | ICD-10-CM | POA: Insufficient documentation

## 2019-10-11 DIAGNOSIS — I5022 Chronic systolic (congestive) heart failure: Secondary | ICD-10-CM

## 2019-10-11 MED ORDER — TORSEMIDE 20 MG PO TABS: ORAL_TABLET | ORAL | 2 refills | Status: DC

## 2019-10-11 MED ORDER — TORSEMIDE 20 MG PO TABS: 60.0000 mg | ORAL_TABLET | Freq: Two times a day (BID) | ORAL | 2 refills | Status: DC

## 2019-10-11 MED ORDER — DIGOXIN 125 MCG PO TABS: 0.0625 mg | ORAL_TABLET | ORAL | 3 refills | Status: DC

## 2019-10-11 NOTE — Telephone Encounter (Signed)
Spoke with Dorene Ar EMT paramedicine program and advised of Dr Claris Gladden recommendations to take Torsemide 80 mg every morning and 60 mg every evening with BMET on Friday.  She will make sure the correct dosages are in patien'ts pill box.

## 2019-10-11 NOTE — Progress Notes (Signed)
Attempted call to [name], EMT Paramedicine and left message for return call.

## 2019-10-11 NOTE — Telephone Encounter (Signed)
Spoke with brother, DPR.  Patient is asymptomatic for fluid accumulation.   Remote transmission received before Dr Claris Gladden recommendations to hold Torsemide 2/16 & 2/17 and then decrease to 60 mg bid.    OptivolThoracic impedancesuggesting possible ongoing fluid accumulation since 09/21/2019.  Prescribed:  Torsemide20mg take3tablets(60 mg total)by mouth twice a day (decreased on 10/10/19).  Metolazone 2.5 mg take 1 tablet by mouth every Wednesday morning with 1 extra Potassium.   Klor Con 53mEq take 1 tab twice a day.  Take 1 extra tablet on Wednesday morning with Metolazone  Labs:   10/06/2019 Creatinine 2.71, BUN 28, Potassium 3.7, Sodium 133, GFR 23-27 09/28/2019 Creatinine 1.94, BUN 39, Potassium 4.4, Sodium 132, GFR 35-40 09/20/2019 Creatinine 1.43, BUN 19, Potassium 3.5, Sodium 135, GFR 50-58  A complete set of results can be found in Results Review  Note sent to Dr [name] for review and recommendations if needed.   Follow-up plan: ICM clinic phone appointment on2/22/2021 recheck fluid levels.   3 month ICM trend: 10/11/2019    1 Year ICM trend:

## 2019-10-11 NOTE — Telephone Encounter (Signed)
Take torsemide to 80 qam/60 qpm but get BMET on Friday.

## 2019-10-11 NOTE — Telephone Encounter (Signed)
Spoke with patient's brother who had questions about medication changes made from 10/06/19 labs. Informed him that Dr. Claris Gladden instructions were to hold digoxin x2 days, then decrease to every other day. Hold torsemide for 2 days then decrease to 60 mg BID and stop losartan. Scheduled repeat BMET and digoxin level in 1 week. Informed Kristi with paramedicine about changes.   Audry Riles, PharmD, BCPS, BCCP, CPP Heart Failure Clinic Pharmacist [phone]

## 2019-10-11 NOTE — Progress Notes (Signed)
Spoke with brother [name].  Advised Dr [name] changed Torsemide dosage and he needs to take Torsemide 80 mg every morning and 60 mg every evening.  BMET ordered for 10/13/2019.  Advised he can have BMET drawn at [facility] or [place] in [place] instead of driving to [place].  Advised to call facility to make lab appointment in the facility he chooses. He repeated instructions back correctly.  See ICM Phone note for new orders.

## 2019-10-11 NOTE — Progress Notes (Signed)
[name], MD  You; Valeda Malm, RN; Branch, Philicia R, CMA 11 minutes ago (2:32 PM)     Take torsemide to 80 qam/60 qpm but get BMET on Friday.

## 2019-10-11 NOTE — Progress Notes (Signed)
EPIC Encounter for ICM Monitoring  Patient Name: Carl [name] is a 68 y.o. male Date: 10/11/2019 Primary Care Physican: [name], MD Primary Cardiologist:McLean Electrophysiologist: Allred 09/20/2019 Office Weight: 169 lbs       Spoke with brother, DPR. Brother reports patient eats out at least once a day.  He is not adhering to low salt diet as recommended.  Remote transmission received prior to Dr Claris Gladden recommendations to hold Torsemide dosages 2/16 & 2/17 followed by decreasing to dosage to 60 mg bid.  OptivolThoracic impedancesuggesting possible ongoing fluid accumulation since 09/21/2019.  Prescribed:  Torsemide20mg take3tablets(60 mg total)by mouth twice a day (decreased on 10/10/19).  Metolazone 2.5 mg take 1 tablet by mouth every Wednesday morning with 1 extra Potassium.   Klor Con 68mEq take 1 tab twice a day.  Take 1 extra tablet on Wednesday morning with Metolazone  Labs:   10/06/2019 Creatinine 2.71, BUN 28, Potassium 3.7, Sodium 133, GFR 23-27 09/28/2019 Creatinine 1.94, BUN 39, Potassium 4.4, Sodium 132, GFR 35-40 09/20/2019 Creatinine 1.43, BUN 19, Potassium 3.5, Sodium 135, GFR 50-58  06/16/2019 Creatinine 1.66, BUN 38, Potassium 3.7, Sodium 131, GFR 4249 05/24/2019 Creatinine 1.47, BUN 20, Potassium 3.5, Sodium 128, GFR 49-56 04/14/2019 Creatinine 1.44, BUN 24, Potassium 3.7, Sodium 139, GFR 50-58 03/22/2019 Creatinine 1.45, BUN 35, Potassium 4.0, Sodium 131, GFR 49-57 03/04/2019 Creatinine1.39, BUN24, Potassium3.3, Sodium135, GFR52->60 03/03/2019 Creatinine1.37, BUN24, Potassium3.4, Sodium131, GFR53->60  02/09/2019 Creatinine1.75, BUN28, Potassium4.3, [id number], [id number]  A complete set of results can be found in Results Review  Recommendations: Phone note sent to Dr [name] for review and recommendations if needed.   Follow-up plan: ICM clinic phone appointment on2/22/2021 recheck fluid levels. 91 day device clinic  remote transmission 10/27/2019.  Copy of ICM check sent to Dr.Allred.  3 month ICM trend: 10/11/2019    1 Year ICM trend:       Rosalene Billings, RN 10/11/2019 9:02 AM

## 2019-10-11 NOTE — Telephone Encounter (Signed)
Attempted call to [name], EMT Paramedicine and left message for return call.

## 2019-10-11 NOTE — Telephone Encounter (Signed)
Spoke with brother [name].  Advised Dr [name] changed Torsemide dosage and he needs to take Torsemide 80 mg every morning and 60 mg every evening.  BMET ordered for 10/13/2019.  Advised he can have BMET drawn at [facility] or [place] in [place] instead of driving to [place].  Advised to call facility to make lab appointment in the facility he chooses. He repeated instructions back correctly.

## 2019-10-11 NOTE — Progress Notes (Signed)
Spoke with Dorene Ar EMT paramedicine program and advised of Dr Claris Gladden recommendations to take Torsemide 80 mg every morning and 60 mg every evening with BMET on Friday.  She will make sure the correct dosages are in patien'ts pill box.

## 2019-10-12 ENCOUNTER — Encounter (HOSPITAL_COMMUNITY): Payer: Self-pay

## 2019-10-12 NOTE — Progress Notes (Signed)
Per [name] at [facility] Dr [name] made changes to Milik;s meds.  Rafeeq took 80 mg torsemide yesterday evening and changed his med boxes to 80 mg torsemide in morning and 60 mg torsemide in evening.  He takes metolazone with extra potassium on every Wednesday.  Digoxin was changed to 1/2 tablet to every other day and losartan is pulled out of his med boxes.    [place] [phone]

## 2019-10-14 ENCOUNTER — Telehealth (HOSPITAL_COMMUNITY): Payer: Self-pay

## 2019-10-14 NOTE — Telephone Encounter (Signed)
Pt's brother wants pt to have lab work drawn at [facility] in [place]. Sent script via fax. bmet and dig level to be drawn 10/18/19. Lab appt cancelled.

## 2019-10-15 LAB — BASIC METABOLIC PANEL
BUN/Creatinine Ratio: 12 (ref 10–24)
BUN: 23 mg/dL (ref 8–27)
CO2: 28 mmol/L (ref 20–29)
Calcium: 9.6 mg/dL (ref 8.6–10.2)
Chloride: 91 mmol/L — ABNORMAL LOW (ref 96–106)
Creatinine, Ser: 1.97 mg/dL — ABNORMAL HIGH (ref 0.76–1.27)
GFR calc Af Amer: 39 mL/min/{1.73_m2} — ABNORMAL LOW (ref >59)
GFR calc non Af Amer: 34 mL/min/{1.73_m2} — ABNORMAL LOW (ref >59)
Glucose: 107 mg/dL — ABNORMAL HIGH (ref 65–99)
Potassium: 3.3 mmol/L — ABNORMAL LOW (ref 3.5–5.2)
Sodium: 136 mmol/L (ref 134–144)

## 2019-10-17 ENCOUNTER — Ambulatory Visit (INDEPENDENT_AMBULATORY_CARE_PROVIDER_SITE_OTHER): Payer: Medicare Other

## 2019-10-17 ENCOUNTER — Encounter (HOSPITAL_COMMUNITY): Payer: Self-pay

## 2019-10-17 ENCOUNTER — Other Ambulatory Visit (HOSPITAL_COMMUNITY): Payer: Self-pay

## 2019-10-17 DIAGNOSIS — Z95810 Presence of automatic (implantable) cardiac defibrillator: Secondary | ICD-10-CM

## 2019-10-17 DIAGNOSIS — I5022 Chronic systolic (congestive) heart failure: Secondary | ICD-10-CM

## 2019-10-17 NOTE — Progress Notes (Signed)
Had a home visit with Carl [name] today.  He states did not have any meds to take last night in his box so he did not take any.  He had meds in Sunday night slot, showed it to him and he swears they were not in there.  He has took today evening meds out and put somewhere else, he states he does that in case he goes off.  [name] his brother and I have been telling him not to do that, he will misplace them.  Concerns with him not taking his meds right are he is placing them somewhere else.  Contacted [name] and advised him.  He states he is working on getting someone to come to check on him a few days a week. We talked about eating out and what foods are high in sodium.  Discussed drinking more water and less soft drinks and tea.  [name] has no complaint of pain or problems today.  He is a little more difficult to talk today, not quite his self, could be him missing his cymbalta last night and he states did not sleep well.   He has no swelling in lower extremities andd lungs are clear.  Meds were placed in his med boxes and meds verified.  Did see the potassium dose change from his blood work on past Friday.  Will continue to visit for heart failure, medication compliant and diet.   [place] [phone]

## 2019-10-17 NOTE — Progress Notes (Signed)
EPIC Encounter for ICM Monitoring  Patient Name: Carl [name] is a 69 y.o. male Date: 10/17/2019 Primary Care Physican: Carl Harrier, MD Primary Cardiologist:McLean Electrophysiologist: Allred 09/20/2019 OfficeWeight: 169 lbs   Prior to Last Session 03-Oct-2019 to 10-Oct-2019 7 days   PVC Runs (2-4 beats) 132.9  per hour     PVC Singles       489.0  per hour   Since Last Session 10-Oct-2019 to 17-Oct-2019 7 days   PVC Runs (2-4 beats) 115.9  per hour     PVC Singles       519.7  per hour                                                            Attempted call to brother, Carl [name] per DPR and unable to reach. Transmission reviewed. Per 2/24 note by Carl [name] from Paramedicine program there is a concern patient is not take meds as prescribed due to he removes the pills from pill box and places somewhere else in the house but then does not take them.    OptivolThoracic impedanceimproving since Torsemide increase on 2/17 but still suggesting possible ongoing fluid accumulation.  Carl Marshall, NP device clinic reviewed report for afib but it is frequent PVC's instead of Afib.  Prescribed:  Torsemide20mg take4 tablets(80 mg total)by mouth every morning and 3 tablets (60 mg total) every evening.  Metolazone 2.5 mg take 1 tablet by mouth every Wednesday morning with 1 extra Potassium.   Klor Con 10mEq take 2 tablets (40 mEq total) every morning and 1 tablet (20 mEq total) every evening.Take 1 extra tablet on Wednesday morning with Metolazone  Labs: 10/14/2019 Creatinine 1.97, BUN 23, Potassium 3.3, Sodium 136, GFR 34-39 (potassium increased 2/22) 10/06/2019 Creatinine 2.71, BUN 28, Potassium 3.7, Sodium 133, GFR 23-27 09/28/2019 Creatinine1.94, BUN39, Potassium4.4, Sodium132, GFR35-40 09/20/2019 Creatinine1.43, BUN19, Potassium3.5, Sodium135, GFR50-58 06/16/2019 Creatinine 1.66, BUN 38, Potassium 3.7, Sodium 131, GFR 4249 05/24/2019  Creatinine 1.47, BUN 20, Potassium 3.5, Sodium 128, GFR 49-56 04/14/2019 Creatinine 1.44, BUN 24, Potassium 3.7, Sodium 139, GFR 50-58 03/22/2019 Creatinine 1.45, BUN 35, Potassium 4.0, Sodium 131, GFR 49-57 03/04/2019 Creatinine1.39, BUN24, Potassium3.3, Sodium135, GFR52->60 03/03/2019 Creatinine1.37, BUN24, Potassium3.4, Sodium131, GFR53->60  02/09/2019 Creatinine1.75, BUN28, Potassium4.3, [id number], [id number]  A complete set of results can be found in Results Review  Recommendations: Sent to Dr Carl [name] for review and will call back if any recommendations.  Follow-up plan: ICM clinic phone appointment on3/12/2019 to recheck fluid levels. 91 day device clinic remote transmission 10/27/2019.  Copy of ICM check sent to Carl [name].  3 month ICM trend: 10/17/2019    1 Year ICM trend:       Carl Billings, RN 10/17/2019 12:52 PM

## 2019-10-18 ENCOUNTER — Other Ambulatory Visit (HOSPITAL_COMMUNITY): Payer: Medicare Other

## 2019-10-18 ENCOUNTER — Telehealth: Payer: Self-pay

## 2019-10-18 NOTE — Telephone Encounter (Signed)
Remote ICM transmission received.  Attempted call to brother Ecker Reilley, regarding ICM remote transmission and no answer.

## 2019-10-27 ENCOUNTER — Ambulatory Visit (INDEPENDENT_AMBULATORY_CARE_PROVIDER_SITE_OTHER): Payer: Medicare Other

## 2019-10-27 ENCOUNTER — Ambulatory Visit (INDEPENDENT_AMBULATORY_CARE_PROVIDER_SITE_OTHER): Payer: Medicare Other | Admitting: *Deleted

## 2019-10-27 ENCOUNTER — Telehealth: Payer: Self-pay | Admitting: Emergency Medicine

## 2019-10-27 DIAGNOSIS — I255 Ischemic cardiomyopathy: Secondary | ICD-10-CM

## 2019-10-27 DIAGNOSIS — Z95810 Presence of automatic (implantable) cardiac defibrillator: Secondary | ICD-10-CM

## 2019-10-27 DIAGNOSIS — I5022 Chronic systolic (congestive) heart failure: Secondary | ICD-10-CM

## 2019-10-27 LAB — CUP PACEART REMOTE DEVICE CHECK
Battery Remaining Longevity: 98 mo
Battery Voltage: 2.94 V
Brady Statistic RV Percent Paced: 0.56 %
Date Time Interrogation Session: 20210304031606
HighPow Impedance: 76 Ohm
Implantable Lead Implant Date: 20160906
Implantable Lead Location: 753860
Implantable Pulse Generator Implant Date: 20160906
Lead Channel Impedance Value: 323 Ohm
Lead Channel Impedance Value: 399 Ohm
Lead Channel Pacing Threshold Amplitude: 0.625 V
Lead Channel Pacing Threshold Pulse Width: 0.4 ms
Lead Channel Sensing Intrinsic Amplitude: 12.5 mV
Lead Channel Sensing Intrinsic Amplitude: 12.5 mV
Lead Channel Setting Pacing Amplitude: 2.5 V
Lead Channel Setting Pacing Pulse Width: 0.4 ms
Lead Channel Setting Sensing Sensitivity: 0.3 mV

## 2019-10-27 NOTE — Progress Notes (Signed)
ICD Remote  

## 2019-10-27 NOTE — Telephone Encounter (Signed)
Received scheduled transmission received . Optivol elevated , thoracic impedance decreased . AF burden elevated at  93.2% which is a decrease from 10/17/19 transmission of 99 %.  Patient reached after contacting his brother who reported that the patient has not had a change in condition. Patient reports no CP, palpitations, but does report SOB that is intermittent. He sleeps on 1 pillow at night. Patient HOH and difficult to assess. Patient has appointment with Dr [name] on 11/01/19 and stressed importance of patient attending the appointment.

## 2019-10-31 ENCOUNTER — Telehealth (HOSPITAL_COMMUNITY): Payer: Self-pay

## 2019-10-31 ENCOUNTER — Other Ambulatory Visit (HOSPITAL_COMMUNITY): Payer: Self-pay

## 2019-10-31 ENCOUNTER — Encounter (HOSPITAL_COMMUNITY): Payer: Self-pay

## 2019-10-31 NOTE — Progress Notes (Signed)
Today had a home visit with Carl [name].  He states doing ok, complains of pain in his legs.  Verified his meds and filled his med boxes.  He needs multi vit and stool softners before next visit, advised his brother Carl [name].  He is aware of Cardiology appt for tomorrow, he appears scared, attempted to calm him down.  Lungs are clear, he denies abdomen being tight feeling.  No edema in lower extremities.  He denies chest pain, headaches, dizziness or shortness of breath.  He weighs daily. Discussed diet and eating out.  His med boxes were better this week, it appears he took them all in past 2 weeks.  He states been drinking water.  Will continue to visit for heart failure, diet and medication compliance.  Will watch for med changes after his cardiology appt tomorrow.   [place] [phone]

## 2019-10-31 NOTE — Progress Notes (Signed)
EPIC Encounter for ICM Monitoring  Patient Name: Carl [name] is a 69 y.o. male Date: 10/31/2019 Primary Care Physican: [name], MD Primary Cardiologist:McLean Electrophysiologist: Allred 09/20/2019 OfficeWeight: 169 lbs  Transmission reviewed.  Jodi Geralds, [facility] RN spoke with brother, Vraj Tease on 3/4 and patient is doing fine and condition unchanged.     OptivolThoracic impedanceimproving but continues to trend below baseline normal.  Fluid index remains > normal threshold  Prescribed:  Torsemide20mg take4 tablets(80 mg total)by mouth every morning and 3 tablets (60 mg total) every evening.  Metolazone 2.5 mg take 1 tablet by mouth every Wednesday morning with 1 extra Potassium.   Klor Con 36mEq take 2 tablets (40 mEq total) every morning and 1 tablet (20 mEq total) every evening.Take 1 extra tablet on Wednesday morning with Metolazone  Labs: 10/14/2019 Creatinine 1.97, BUN 23, Potassium 3.3, Sodium 136, GFR 34-39 (potassium increased 2/22) 10/06/2019 Creatinine 2.71, BUN 28, Potassium 3.7, Sodium 133, GFR 23-27 09/28/2019 Creatinine1.94, BUN39, Potassium4.4, Sodium132, GFR35-40 09/20/2019 Creatinine1.43, BUN19, Potassium3.5, Sodium135, GFR50-58 06/16/2019 Creatinine 1.66, BUN 38, Potassium 3.7, Sodium 131, GFR 4249 05/24/2019 Creatinine 1.47, BUN 20, Potassium 3.5, Sodium 128, GFR 49-56 04/14/2019 Creatinine 1.44, BUN 24, Potassium 3.7, Sodium 139, GFR 50-58 03/22/2019 Creatinine 1.45, BUN 35, Potassium 4.0, Sodium 131, GFR 49-57 03/04/2019 Creatinine1.39, BUN24, Potassium3.3, Sodium135, GFR52->60 03/03/2019 Creatinine1.37, BUN24, Potassium3.4, Sodium131, GFR53->60  02/09/2019 Creatinine1.75, BUN28, Potassium4.3, [id number], [id number]  A complete set of results can be found in Results Review  Recommendations: Patient will be given recommendations at Dr Claris Gladden visit on  11/01/2019  Follow-up plan: ICM clinic phone appointment on3/15/2021. 91 day device clinic remote transmission 01/26/2020.Telehealth visit with [name] on 11/28/2019  Copy of ICM check sent to Dr.Allred.  3 month ICM trend: 10/27/2019    1 Year ICM trend:       Rosalene Billings, RN 10/31/2019 12:44 PM

## 2019-10-31 NOTE — Telephone Encounter (Signed)
COVID-19 pre-appointment screening questions: QUESTIONS ANSWERED BY HIS BROTHER MIKE    Do you have a history of COVID-19 or a positive test result in the past 7-10 days?NO  To the best of your knowledge, have you been in close contact with anyone with a confirmed diagnosis of COVID 19?NO  Have you had any one or more of the following: Fever, chills, cough, shortness of breath (out of the normal for you) or any flu-like symptoms?NO  Are you experiencing any of the following symptoms that is new or out of usual for you:NO  . Ear, nose or throat discomfort . Sore throat . Headache . Muscle Pain . Diarrhea . Loss of taste or smell   Reviewed all the following with patient: REVIEWED . Use of hand sanitizer when entering the building . Everyone is required to wear a mask in the building, if you do not have a mask we are happy to provide you with one when you arrive . NO Visitor guidelines   If patient answers YES to any of questions they must change to a virtual visit and place note in comments about symptoms

## 2019-11-01 ENCOUNTER — Other Ambulatory Visit: Payer: Self-pay

## 2019-11-01 ENCOUNTER — Ambulatory Visit (HOSPITAL_COMMUNITY)
Admission: RE | Admit: 2019-11-01 | Discharge: 2019-11-01 | Disposition: A | Discharge status: Home / Self Care | Payer: Medicare Other | Source: Ambulatory Visit | Attending: Cardiology | Admitting: Cardiology

## 2019-11-01 ENCOUNTER — Encounter (HOSPITAL_COMMUNITY): Payer: Self-pay | Admitting: Cardiology

## 2019-11-01 VITALS — BP 94/60 | HR 76 | Wt 168.4 lb

## 2019-11-01 DIAGNOSIS — N183 Chronic kidney disease, stage 3 unspecified: Secondary | ICD-10-CM | POA: Diagnosis not present

## 2019-11-01 DIAGNOSIS — I255 Ischemic cardiomyopathy: Secondary | ICD-10-CM | POA: Diagnosis not present

## 2019-11-01 DIAGNOSIS — Z7901 Long term (current) use of anticoagulants: Secondary | ICD-10-CM | POA: Insufficient documentation

## 2019-11-01 DIAGNOSIS — I2510 Atherosclerotic heart disease of native coronary artery without angina pectoris: Secondary | ICD-10-CM | POA: Insufficient documentation

## 2019-11-01 DIAGNOSIS — Z951 Presence of aortocoronary bypass graft: Secondary | ICD-10-CM | POA: Diagnosis not present

## 2019-11-01 DIAGNOSIS — I4819 Other persistent atrial fibrillation: Secondary | ICD-10-CM | POA: Diagnosis not present

## 2019-11-01 DIAGNOSIS — I4510 Unspecified right bundle-branch block: Secondary | ICD-10-CM | POA: Insufficient documentation

## 2019-11-01 DIAGNOSIS — F70 Mild intellectual disabilities: Secondary | ICD-10-CM | POA: Diagnosis not present

## 2019-11-01 DIAGNOSIS — I4891 Unspecified atrial fibrillation: Secondary | ICD-10-CM | POA: Diagnosis not present

## 2019-11-01 DIAGNOSIS — Z87891 Personal history of nicotine dependence: Secondary | ICD-10-CM | POA: Diagnosis not present

## 2019-11-01 DIAGNOSIS — F329 Major depressive disorder, single episode, unspecified: Secondary | ICD-10-CM | POA: Insufficient documentation

## 2019-11-01 DIAGNOSIS — Z79899 Other long term (current) drug therapy: Secondary | ICD-10-CM | POA: Insufficient documentation

## 2019-11-01 DIAGNOSIS — Z8249 Family history of ischemic heart disease and other diseases of the circulatory system: Secondary | ICD-10-CM | POA: Diagnosis not present

## 2019-11-01 DIAGNOSIS — K7460 Unspecified cirrhosis of liver: Secondary | ICD-10-CM | POA: Diagnosis not present

## 2019-11-01 DIAGNOSIS — I5022 Chronic systolic (congestive) heart failure: Secondary | ICD-10-CM | POA: Diagnosis present

## 2019-11-01 LAB — COMPREHENSIVE METABOLIC PANEL
ALT: 16 U/L (ref 0–44)
AST: 25 U/L (ref 15–41)
Albumin: 4 g/dL (ref 3.5–5.0)
Alkaline Phosphatase: 130 U/L — ABNORMAL HIGH (ref 38–126)
Anion gap: 11 (ref 5–15)
BUN: 28 mg/dL — ABNORMAL HIGH (ref 8–23)
CO2: 29 mmol/L (ref 22–32)
Calcium: 9.3 mg/dL (ref 8.9–10.3)
Chloride: 95 mmol/L — ABNORMAL LOW (ref 98–111)
Creatinine, Ser: 1.86 mg/dL — ABNORMAL HIGH (ref 0.61–1.24)
GFR calc Af Amer: 42 mL/min — ABNORMAL LOW (ref >60)
GFR calc non Af Amer: 36 mL/min — ABNORMAL LOW (ref >60)
Glucose, Bld: 103 mg/dL — ABNORMAL HIGH (ref 70–99)
Potassium: 4.3 mmol/L (ref 3.5–5.1)
Sodium: 135 mmol/L (ref 135–145)
Total Bilirubin: 1.2 mg/dL (ref 0.3–1.2)
Total Protein: 7.3 g/dL (ref 6.5–8.1)

## 2019-11-01 LAB — CBC
HCT: 37.3 % — ABNORMAL LOW (ref 39.0–52.0)
Hemoglobin: 12.1 g/dL — ABNORMAL LOW (ref 13.0–17.0)
MCH: 33.5 pg (ref 26.0–34.0)
MCHC: 32.4 g/dL (ref 30.0–36.0)
MCV: 103.3 fL — ABNORMAL HIGH (ref 80.0–100.0)
Platelets: 207 10*3/uL (ref 150–400)
RBC: 3.61 MIL/uL — ABNORMAL LOW (ref 4.22–5.81)
RDW: 15.1 % (ref 11.5–15.5)
WBC: 8.5 10*3/uL (ref 4.0–10.5)
nRBC: 0 % (ref 0.0–0.2)

## 2019-11-01 LAB — TSH: TSH: 0.874 u[IU]/mL (ref 0.350–4.500)

## 2019-11-01 LAB — DIGOXIN LEVEL: Digoxin Level: 1 ng/mL (ref 0.8–2.0)

## 2019-11-01 MED ORDER — AMIODARONE HCL 200 MG PO TABS: ORAL_TABLET | ORAL | 5 refills | Status: DC

## 2019-11-01 MED ORDER — APIXABAN 5 MG PO TABS: 5.0000 mg | ORAL_TABLET | Freq: Two times a day (BID) | ORAL | 5 refills | Status: DC

## 2019-11-01 MED ORDER — TORSEMIDE 20 MG PO TABS: 80.0000 mg | ORAL_TABLET | Freq: Two times a day (BID) | ORAL | 5 refills | Status: DC

## 2019-11-01 NOTE — Progress Notes (Signed)
Called Kristi with [facility] to inform her of changes to med list

## 2019-11-01 NOTE — Progress Notes (Signed)
Kristi called back, all med changes discussed. She will go to patients home tomorrow to make changes to pill boxes

## 2019-11-01 NOTE — Patient Instructions (Addendum)
START Amiodarone 200mg  (1 tab) twice a day for 2 weeks THEN 200mg  (1 tab) daily after that  START Eliquis 5mg  (1 tab) twice a day. (CANNOT MISS ANY DOSES)  INCREASE torsemide to 80mg  (4 tabs) twice a day  STOP Aspirin  Your physician recommends that you schedule a follow-up appointment in: 2 weeks with the Nurse Practitioner  Please call office at [phone] option [phone] if you have any questions or concerns.   Dear Carl [name],   You are scheduled for a TEE/Cardioversion on Wednesday March 17th, 2021 with Dr. [name].  Please arrive at the [facility] (Main Entrance A) at [facility]: [address] [place], [place] [address] at 9 am.   DIET: Nothing to eat or drink after midnight except a sip of water with medications (see medication instructions below)  Medication Instructions: Take medications at 6am  Take Amiodarone and Eliquis on the morning of your             procedure  You will need to continue your anticoagulant after your  procedure until you  are told by your  Provider that it is safe to stop   Hold ALL other medications on the morning of your procedure   Labs: done today in clinic  You will need a pre procedure COVID test    WHEN:  Monday March 15th, 2021 at 11:55am WHERE: [facility]                 [place] [id number]  This is a drive thru testing site, you will remain in your car. Be sure to get in the line FOR PROCEDURES Once you have been swabbed you will need to remain home in quarantine until you return for your procedure.   You must have a responsible person to drive you home and stay in the waiting area during your procedure. Failure to do so could result in cancellation.  Bring your insurance cards.  *Special Note: Every effort is made to have your procedure done on time. Occasionally there are emergencies that occur at the hospital that may cause delays. Please be patient if a delay does occur.

## 2019-11-02 ENCOUNTER — Encounter (HOSPITAL_COMMUNITY): Payer: Self-pay

## 2019-11-02 ENCOUNTER — Other Ambulatory Visit (HOSPITAL_COMMUNITY): Payer: Self-pay

## 2019-11-02 ENCOUNTER — Telehealth (HOSPITAL_COMMUNITY): Payer: Self-pay

## 2019-11-02 DIAGNOSIS — I4819 Other persistent atrial fibrillation: Secondary | ICD-10-CM

## 2019-11-02 NOTE — Progress Notes (Signed)
Today had a home visit with Carl [name] to make changes to his medication boxes.  His pharmacy will not fill his amiodarone that was prescribed yesterday due to conflict with carvedilol.  Contacted HF clinic and advised, Carl [name] states she will let me know what Dr [name] says.  Made changes to his meds and verified them.  Carl [name] states he is doing ok today, he had no complaints.  Will followup with pharmacy to get his amiodarone.  After speaking with Carl [name] at HF clinic this evening she was able to get pharmacy to fill Amiodarone.  Will place in his his boxes this evening and he will start the amiodarone today.  Will continue to visit for heart failure.   [place] [phone]

## 2019-11-02 NOTE — Telephone Encounter (Signed)
Received call from [place] of paramedicine. She states pharmacy not filling amiodarone because patient is on coreg and there is an interaction. Please advise.
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
## 2019-11-03 NOTE — Telephone Encounter (Signed)
Needs to start the amiodarone as ordered.  Pharmacist should know that amiodarone is used very often with Coreg.

## 2019-11-03 NOTE — Telephone Encounter (Signed)
Spoke with Theatre manager. She stastes she spoke with [facility] and pharmacy did approve the amiodarone. She picked it up and prepared patients pill box for him.

## 2019-11-05 ENCOUNTER — Other Ambulatory Visit (HOSPITAL_COMMUNITY): Payer: Self-pay | Admitting: Cardiology

## 2019-11-07 ENCOUNTER — Other Ambulatory Visit (HOSPITAL_COMMUNITY)
Admission: RE | Admit: 2019-11-07 | Discharge: 2019-11-07 | Disposition: A | Discharge status: Home / Self Care | Payer: Medicare Other | Source: Ambulatory Visit | Attending: Cardiology | Admitting: Cardiology

## 2019-11-07 ENCOUNTER — Ambulatory Visit (INDEPENDENT_AMBULATORY_CARE_PROVIDER_SITE_OTHER): Payer: Medicare Other

## 2019-11-07 DIAGNOSIS — I5022 Chronic systolic (congestive) heart failure: Secondary | ICD-10-CM | POA: Diagnosis not present

## 2019-11-07 DIAGNOSIS — Z95810 Presence of automatic (implantable) cardiac defibrillator: Secondary | ICD-10-CM | POA: Diagnosis not present

## 2019-11-07 DIAGNOSIS — Z01812 Encounter for preprocedural laboratory examination: Secondary | ICD-10-CM | POA: Diagnosis present

## 2019-11-07 DIAGNOSIS — Z20822 Contact with and (suspected) exposure to covid-19: Secondary | ICD-10-CM | POA: Diagnosis not present

## 2019-11-07 LAB — SARS CORONAVIRUS 2 (TAT 6-24 HRS): SARS Coronavirus 2: NEGATIVE

## 2019-11-07 NOTE — Progress Notes (Signed)
EPIC Encounter for ICM Monitoring  Patient Name: Carl [name] is a 69 y.o. male Date: 11/07/2019 Primary Care Physican: [name], MD Primary Cardiologist:[place] Electrophysiologist: Allred 11/01/2019 OfficeWeight: 168 lbs  Transmission reviewed.    OptivolThoracic impedancenormal. Per Dr Claris Gladden 3/8 OV notes, patient was in Afib and started on Eliquis  Prescribed:  Torsemide20mg take4tablets(80mg  total)by mouth twice a day.  Metolazone 2.5 mg take 1 tablet by mouth every Wednesday morning with 1 extra Potassium.   Klor Con 20 mEq Taking 40 meq in morning and 20 meq in evening.  Extra 60 meq on Wednesday morning /with Metolazone.  Labs: 11/01/2019 Creatinine 1.86, BUN 28, Potassium 4.3, Sodium 135, GFR 36-42 10/14/2019 Creatinine 1.97, BUN 23, Potassium 3.3, Sodium 136, GFR 34-39(potassium increased 2/22) 10/06/2019 Creatinine 2.71, BUN 28, Potassium 3.7, Sodium 133, GFR 23-27 09/28/2019 Creatinine1.94, BUN39, Potassium4.4, Sodium132, GFR35-40 09/20/2019 Creatinine1.43, BUN19, Potassium3.5, Sodium135, GFR50-58 06/16/2019 Creatinine 1.66, BUN 38, Potassium 3.7, Sodium 131, GFR 4249 05/24/2019 Creatinine 1.47, BUN 20, Potassium 3.5, Sodium 128, GFR 49-56 04/14/2019 Creatinine 1.44, BUN 24, Potassium 3.7, Sodium 139, GFR 50-58 03/22/2019 Creatinine 1.45, BUN 35, Potassium 4.0, Sodium 131, GFR 49-57 03/04/2019 Creatinine1.39, BUN24, Potassium3.3, Sodium135, GFR52->60 03/03/2019 Creatinine1.37, BUN24, Potassium3.4, Sodium131, GFR53->60  02/09/2019 Creatinine1.75, BUN28, Potassium4.3, [id number], [id number]  A complete set of results can be found in Results Review  Recommendations:None  Follow-up plan: ICM clinic phone appointment on 12/12/2019. 91 day device clinic remote transmission 01/26/2020.Office visit with HF clinic PA/NP on 11/22/2019 and telehealth visit with [name] on  11/28/2019  Copy of ICM check sent to Dr.Allred.  3 month ICM trend: 11/07/2019    1 Year ICM trend:       Rosalene Billings, RN 11/07/2019 1:39 PM

## 2019-11-09 ENCOUNTER — Other Ambulatory Visit: Payer: Self-pay

## 2019-11-09 ENCOUNTER — Ambulatory Visit (HOSPITAL_COMMUNITY): Payer: Medicare Other | Admitting: Certified Registered Nurse Anesthetist

## 2019-11-09 ENCOUNTER — Ambulatory Visit (HOSPITAL_COMMUNITY)
Admission: RE | Admit: 2019-11-09 | Discharge: 2019-11-09 | Disposition: A | Discharge status: Home / Self Care | Payer: Medicare Other | Source: Ambulatory Visit | Attending: Cardiology | Admitting: Cardiology

## 2019-11-09 ENCOUNTER — Ambulatory Visit (HOSPITAL_BASED_OUTPATIENT_CLINIC_OR_DEPARTMENT_OTHER): Payer: Medicare Other

## 2019-11-09 ENCOUNTER — Encounter (HOSPITAL_COMMUNITY)
Admission: RE | Disposition: A | Discharge status: Home / Self Care | Payer: Self-pay | Source: Ambulatory Visit | Attending: Cardiology

## 2019-11-09 DIAGNOSIS — F329 Major depressive disorder, single episode, unspecified: Secondary | ICD-10-CM | POA: Insufficient documentation

## 2019-11-09 DIAGNOSIS — Z95810 Presence of automatic (implantable) cardiac defibrillator: Secondary | ICD-10-CM | POA: Insufficient documentation

## 2019-11-09 DIAGNOSIS — Z951 Presence of aortocoronary bypass graft: Secondary | ICD-10-CM | POA: Diagnosis not present

## 2019-11-09 DIAGNOSIS — I5022 Chronic systolic (congestive) heart failure: Secondary | ICD-10-CM | POA: Diagnosis not present

## 2019-11-09 DIAGNOSIS — I255 Ischemic cardiomyopathy: Secondary | ICD-10-CM | POA: Insufficient documentation

## 2019-11-09 DIAGNOSIS — I340 Nonrheumatic mitral (valve) insufficiency: Secondary | ICD-10-CM

## 2019-11-09 DIAGNOSIS — I2510 Atherosclerotic heart disease of native coronary artery without angina pectoris: Secondary | ICD-10-CM | POA: Diagnosis not present

## 2019-11-09 DIAGNOSIS — F70 Mild intellectual disabilities: Secondary | ICD-10-CM | POA: Diagnosis not present

## 2019-11-09 DIAGNOSIS — I4891 Unspecified atrial fibrillation: Secondary | ICD-10-CM

## 2019-11-09 DIAGNOSIS — I4510 Unspecified right bundle-branch block: Secondary | ICD-10-CM | POA: Diagnosis not present

## 2019-11-09 DIAGNOSIS — L80 Vitiligo: Secondary | ICD-10-CM | POA: Diagnosis not present

## 2019-11-09 DIAGNOSIS — N183 Chronic kidney disease, stage 3 unspecified: Secondary | ICD-10-CM | POA: Diagnosis not present

## 2019-11-09 DIAGNOSIS — K7460 Unspecified cirrhosis of liver: Secondary | ICD-10-CM | POA: Diagnosis not present

## 2019-11-09 DIAGNOSIS — I4819 Other persistent atrial fibrillation: Secondary | ICD-10-CM | POA: Insufficient documentation

## 2019-11-09 DIAGNOSIS — Z87891 Personal history of nicotine dependence: Secondary | ICD-10-CM | POA: Insufficient documentation

## 2019-11-09 DIAGNOSIS — I361 Nonrheumatic tricuspid (valve) insufficiency: Secondary | ICD-10-CM

## 2019-11-09 DIAGNOSIS — Z79899 Other long term (current) drug therapy: Secondary | ICD-10-CM | POA: Diagnosis not present

## 2019-11-09 HISTORY — PX: CARDIOVERSION: SHX1299

## 2019-11-09 HISTORY — PX: TEE WITHOUT CARDIOVERSION: SHX5443

## 2019-11-09 LAB — POCT I-STAT, CHEM 8
BUN: 43 mg/dL — ABNORMAL HIGH (ref 8–23)
Calcium, Ion: 1.07 mmol/L — ABNORMAL LOW (ref 1.15–1.40)
Chloride: 89 mmol/L — ABNORMAL LOW (ref 98–111)
Creatinine, Ser: 2 mg/dL — ABNORMAL HIGH (ref 0.61–1.24)
Glucose, Bld: 113 mg/dL — ABNORMAL HIGH (ref 70–99)
HCT: 38 % — ABNORMAL LOW (ref 39.0–52.0)
Hemoglobin: 12.9 g/dL — ABNORMAL LOW (ref 13.0–17.0)
Potassium: 3.8 mmol/L (ref 3.5–5.1)
Sodium: 131 mmol/L — ABNORMAL LOW (ref 135–145)
TCO2: 34 mmol/L — ABNORMAL HIGH (ref 22–32)

## 2019-11-09 SURGERY — ECHOCARDIOGRAM, TRANSESOPHAGEAL
Anesthesia: Monitor Anesthesia Care

## 2019-11-09 MED ORDER — PROPOFOL 10 MG/ML IV BOLUS
INTRAVENOUS | Status: DC | PRN
  Administered 2019-11-09: 10 mg via INTRAVENOUS

## 2019-11-09 MED ORDER — PROPOFOL 500 MG/50ML IV EMUL
INTRAVENOUS | Status: DC | PRN
  Administered 2019-11-09: 75 ug/kg/min via INTRAVENOUS

## 2019-11-09 MED ORDER — SODIUM CHLORIDE 0.9 % IV SOLN: INTRAVENOUS | Status: DC

## 2019-11-09 MED ORDER — BUTAMBEN-TETRACAINE-BENZOCAINE 2-2-14 % EX AERO
INHALATION_SPRAY | CUTANEOUS | Status: DC | PRN
  Administered 2019-11-09: 1 via TOPICAL

## 2019-11-09 MED ORDER — EPHEDRINE SULFATE-NACL 50-0.9 MG/10ML-% IV SOSY
PREFILLED_SYRINGE | INTRAVENOUS | Status: DC | PRN
  Administered 2019-11-09: 5 mg via INTRAVENOUS

## 2019-11-09 MED ORDER — LIDOCAINE 2% (20 MG/ML) 5 ML SYRINGE
INTRAMUSCULAR | Status: DC | PRN
  Administered 2019-11-09: 20 mg via INTRAVENOUS

## 2019-11-09 MED ORDER — PHENYLEPHRINE 40 MCG/ML (10ML) SYRINGE FOR IV PUSH (FOR BLOOD PRESSURE SUPPORT)
PREFILLED_SYRINGE | INTRAVENOUS | Status: DC | PRN
  Administered 2019-11-09 (×3): 40 ug via INTRAVENOUS
  Administered 2019-11-09 (×5): 80 ug via INTRAVENOUS
  Administered 2019-11-09: 40 ug via INTRAVENOUS

## 2019-11-09 MED ORDER — LACTATED RINGERS IV SOLN: INTRAVENOUS | Status: DC | PRN

## 2019-11-09 NOTE — CV Procedure (Signed)
Procedure: TEE  Indication: Atrial fibrillation  Sedation: Per anesthesiology   Findings: Please see echo section for full report.  Moderately dilated LV with EF 20-25%, diffuse hypokinesis.  Mildly dilated RV with moderately decreased systolic function.  Moderately dilated left atrium, no LA appendage thrombus.  Moderately dilated right atrium.  There was an Amplatzer atrial septal closure device present, no shunting noted by color doppler.  Moderate-severe TR, peak RV-RA gradient 24 mmHg.  Trileaflet aortic valve with no stenosis, trivial AI.  The mitral valve was s/p repair with 2 Mitraclip devices placed in the A2-P2 area.  There was moderate residual MR with PISA ERO 0.25 cm^2.  Mean gradient 3 mmHg across the mitral valve, no significant stenosis.  No pulmonary vein systolic flow reversal noted.  Normal caliber thoracic aorta with mild plaque.   May proceed to DCCV.   Carl [name] 11/09/2019 11:17 AM

## 2019-11-09 NOTE — Progress Notes (Signed)
  Echocardiogram 2D Echocardiogram has been performed.  Carl [name] 11/09/2019, 11:21 AM

## 2019-11-09 NOTE — Anesthesia Procedure Notes (Signed)
Procedure Name: MAC Date/Time: 11/09/2019 10:30 AM Performed by: Janene Harvey, CRNA Pre-anesthesia Checklist: Patient identified, Emergency Drugs available, Suction available and Patient being monitored Oxygen Delivery Method: Nasal cannula Dental Injury: Teeth and Oropharynx as per pre-operative assessment

## 2019-11-09 NOTE — Discharge Instructions (Signed)
Apply hydrocortisone cream and aloe to chest are to decrease soreness/itching.  If skin worsens, contact Dr. Claris Gladden office for prescription cream.    TEE  YOU HAD AN CARDIAC PROCEDURE TODAY: Refer to the procedure report and other information in the discharge instructions given to you for any specific questions about what was found during the examination.   DIET: Your first meal following the procedure should be a light meal and then it is ok to progress to your normal diet. A half-sandwich or bowl of soup is an example of a good first meal. Heavy or fried foods are harder to digest and may make you feel nauseous or bloated. Drink plenty of fluids but you should avoid alcoholic beverages for 24 hours.   ACTIVITY: Your care partner should take you home directly after the procedure. You should plan to take it easy, moving slowly for the rest of the day. You can resume normal activity the day after the procedure however YOU SHOULD NOT DRIVE, use power tools, machinery or perform tasks that involve climbing or major physical exertion for 24 hours (because of the sedation medicines used during the test).   SYMPTOMS TO REPORT IMMEDIATELY: A cardiologist can be reached at any hour. Please call [phone] for any of the following symptoms:  Vomiting of blood or coffee ground material  New, significant abdominal pain  New, significant chest pain or pain under the shoulder blades  Painful or persistently difficult swallowing  New shortness of breath  Black, tarry-looking or red, bloody stools  FOLLOW UP:  Please also call with any specific questions about appointments or follow up tests.

## 2019-11-09 NOTE — Transfer of Care (Signed)
Immediate Anesthesia Transfer of Care Note  Patient: Carl [name]  Procedure(s) Performed: TRANSESOPHAGEAL ECHOCARDIOGRAM (TEE) (N/A ) CARDIOVERSION (N/A )  Patient Location: Endoscopy Unit  Anesthesia Type:General  Level of Consciousness: awake  Airway & Oxygen Therapy: Patient Spontanous Breathing and Patient connected to nasal cannula oxygen  Post-op Assessment: Report given to RN and Post -op Vital signs reviewed and stable  Post vital signs: Reviewed  Last Vitals:  Vitals Value Taken Time  BP 91/56 11/09/19 1117  Temp    Pulse 59 11/09/19 1118  Resp 17 11/09/19 1118  SpO2 100 % 11/09/19 1118  Vitals shown include unvalidated device data.  Last Pain:  Vitals:   11/09/19 0904  TempSrc: Axillary  PainSc: 0-No pain         Complications: No apparent anesthesia complications

## 2019-11-09 NOTE — Anesthesia Preprocedure Evaluation (Addendum)
Anesthesia Evaluation  Patient identified by MRN, date of birth, ID band Patient awake    Reviewed: Allergy & Precautions, NPO status , Patient's Chart, lab work & pertinent test results  History of Anesthesia Complications Negative for: history of anesthetic complications  Airway Mallampati: II  TM Distance: >3 FB Neck ROM: Full    Dental  (+) Edentulous Upper, Edentulous Lower   Pulmonary former smoker,    Pulmonary exam normal        Cardiovascular hypertension, Pt. on home beta blockers and Pt. on medications + CAD, + CABG, + Peripheral Vascular Disease and +CHF  Normal cardiovascular exam+ Cardiac Defibrillator + Valvular Problems/Murmurs (s/p MVR)    '20 TTE - EF 20-25%. LV was moderately dilated. Left  ventricular diastolic Doppler parameters are consistent with pseudonormalization. Elevated left ventricular  end-diastolic pressur.e There is abnormal septal motion consistent with RV pacemaker. Left ventricular diffuse hypokinesis. There is akinesis of the inferoseptal, inferior and inferolateral walls. The right ventricle has severely reduced systolic function. Right ventricular systolic pressure is mildly elevated with an estimated  pressure of 31.6 mmHg. Left atrial size was mild-moderately dilated. Right atrial size was moderately dilated. Mild-mod MR. The MR jet is eccentric anteriorly  directed. S/P mitraclip x1. TR is moderate. Mild sclerosis of the aortic valve. Trivial AI.     Neuro/Psych PSYCHIATRIC DISORDERS Anxiety Depression negative neurological ROS     GI/Hepatic Neg liver ROS, hiatal hernia, PUD, GERD  Medicated and Controlled,  Endo/Other  negative endocrine ROS  Renal/GU CRFRenal disease     Musculoskeletal  (+) Arthritis ,   Abdominal   Peds  Hematology  (+) anemia ,  On eliquis    Anesthesia Other Findings Vitiligo Covid neg 11/07/19   Reproductive/Obstetrics                             Anesthesia Physical Anesthesia Plan  ASA: III  Anesthesia Plan: General and MAC   Post-op Pain Management:    Induction: Intravenous  PONV Risk Score and Plan: 2 and Treatment may vary due to age or medical condition and Propofol infusion  Airway Management Planned: Natural Airway and Nasal Cannula  Additional Equipment: None  Intra-op Plan:   Post-operative Plan:   Informed Consent: I have reviewed the patients History and Physical, chart, labs and discussed the procedure including the risks, benefits and alternatives for the proposed anesthesia with the patient or authorized representative who has indicated his/her understanding and acceptance.       Plan Discussed with: CRNA and Anesthesiologist  Anesthesia Plan Comments: (Begin as MAC for TEE, convert to GA with natural airway if cardioverting )       Anesthesia Quick Evaluation

## 2019-11-09 NOTE — Interval H&P Note (Signed)
History and Physical Interval Note:  11/09/2019 10:45 AM  Carl [name]  has presented today for surgery, with the diagnosis of AFIB.  The various methods of treatment have been discussed with the patient and family. After consideration of risks, benefits and other options for treatment, the patient has consented to  Procedure(s): TRANSESOPHAGEAL ECHOCARDIOGRAM (TEE) (N/A) CARDIOVERSION (N/A) as a surgical intervention.  The patient's history has been reviewed, patient examined, no change in status, stable for surgery.  I have reviewed the patient's chart and labs.  Questions were answered to the patient's satisfaction.     [name]

## 2019-11-09 NOTE — Procedures (Signed)
Electrical Cardioversion Procedure Note Carl [name] [id number] 07/28/1951  Procedure: Electrical Cardioversion Indications:  Atrial Fibrillation  Procedure Details Consent: Risks of procedure as well as the alternatives and risks of each were explained to the (patient/caregiver).  Consent for procedure obtained. Time Out: Verified patient identification, verified procedure, site/side was marked, verified correct patient position, special equipment/implants available, medications/allergies/relevent history reviewed, required imaging and test results available.  Performed  Patient placed on cardiac monitor, pulse oximetry, supplemental oxygen as necessary.  Sedation given: Propofol per anesthesiology Pacer pads placed anterior and posterior chest.  Cardioverted 1 time(s).  Cardioverted at [place].  Evaluation Findings: Post procedure EKG shows: NSR Complications: None Patient did tolerate procedure well.   Carl [name] 11/09/2019, 11:18 AM

## 2019-11-10 ENCOUNTER — Telehealth (HOSPITAL_COMMUNITY): Payer: Self-pay

## 2019-11-10 ENCOUNTER — Encounter: Payer: Self-pay | Admitting: Anesthesiology

## 2019-11-10 NOTE — Telephone Encounter (Signed)
Today spoke with Carl [name] to see how he was after his procedure yesterday and to check with him on his medications.  He contacted me last night and was confused about what to take and guided him.  He states he was ok today, he was taking a nap.  He has took his medications, ate 2 boiled eggs and drank 1 bottle of water and a bottle of pepsi.  Discussed how much pepsi he is drinking everyday and explained it where he may understand importance of cutting back on pepsi.  He states he has been drinking too much pepsi.  We discussed him drinking 1 bottle a day and the rest water.  He states he will try that.  Discussed importance of when he is hungry to eat his meals 3 times a day.  He will not eat throughout the day waiting for his [name] to bring his supper.  He said he will eat something for lunch now.  He sounds good and appeared to be listening and had him to repeat some things back to me.  His brother is out of town and he keeps stating he is going to get someone in [place] during the day to watch him and cook for him that will be healthier for him.  Carl [name] is bad about taking Trai shopping and Carl [name] buying anything he wants, like pepsi and high sodium snacks.  She also takes him out to eat a lot to fast food and places where he eats a lot fo fried foods such as fish houses and fried chicken.  Will keep trying to get Carl [name] and family to watch high sodium foods, visit for heart failure and medication management.   [place] [phone]

## 2019-11-10 NOTE — Anesthesia Postprocedure Evaluation (Signed)
Anesthesia Post Note  Patient: Carl [name]  Procedure(s) Performed: TRANSESOPHAGEAL ECHOCARDIOGRAM (TEE) (N/A ) CARDIOVERSION (N/A )     Patient location during evaluation: PACU Anesthesia Type: MAC Level of consciousness: awake and alert Pain management: pain level controlled Vital Signs Assessment: post-procedure vital signs reviewed and stable Respiratory status: spontaneous breathing, nonlabored ventilation and respiratory function stable Cardiovascular status: stable and blood pressure returned to baseline Anesthetic complications: no    Last Vitals:  Vitals:   11/09/19 1117 11/09/19 1127  BP: (!) 91/56 90/69  Pulse: (!) 59 (!) 59  Resp: 10 16  Temp: 36.5 C   SpO2: 95% 100%    Last Pain:  Vitals:   11/09/19 1127  TempSrc:   PainSc: 0-No pain                 Audry Pili

## 2019-11-14 ENCOUNTER — Other Ambulatory Visit (HOSPITAL_COMMUNITY): Payer: Self-pay

## 2019-11-14 ENCOUNTER — Encounter (HOSPITAL_COMMUNITY): Payer: Self-pay

## 2019-11-14 NOTE — Progress Notes (Signed)
Had a home visit with Carl [name] today.  He appears to be doing good today.  He has all his medications and refilled his med boxes.  His amiodarone with change to once daily starting tomorrow.  It appears he has been taking his medications, boxes are empty.  We discussed him drinking so many sodas a day and he is going to try to cut back to 1 bottle of pepsi a day, he can drink it in halfs or drink a whole one.  Rest of day he will drink water.  Will continue to try to get him to stop sodas.  He had a cardioversion done last week and appeared to have done well with that.  He denies any problems today.  No swelling noted.  Pulse appears regular in rhythem today.  Denies any shortness of breath.  Will continue to visit for heart failure, medication compliance and diet.   [place] [phone]

## 2019-11-18 ENCOUNTER — Telehealth: Payer: Self-pay | Admitting: Student

## 2019-11-18 DIAGNOSIS — I4819 Other persistent atrial fibrillation: Secondary | ICD-10-CM

## 2019-11-18 NOTE — Telephone Encounter (Signed)
Please refer to A Fib clinic per Dr [name].   Amy Clegg NP-C  1:55 PM

## 2019-11-18 NOTE — Addendum Note (Signed)
Addended by: Kerry Dory on: 11/18/2019 02:27 PM   Modules accepted: Orders

## 2019-11-18 NOTE — Telephone Encounter (Signed)
Order for afib clinic placed   Patients brother aware of referral

## 2019-11-18 NOTE — Telephone Encounter (Signed)
  Received [employer] Carelink alert for AF > 6 hours. Pt appears to have gone back into AF overnight. Presenting rhythm appears AF.   Noted TEE/DCCV 11/09/2019, and upcoming HF PA/NP visit 11/22/2019.  Pt also has appointment with Dr. Rayann Heman 11/28/2019.     Wanted to coordinate with you guys for further.

## 2019-11-20 ENCOUNTER — Emergency Department
Admission: EM | Admit: 2019-11-20 | Discharge: 2019-11-20 | Disposition: A | Discharge status: Home / Self Care | Payer: Medicare Other | Attending: Emergency Medicine | Admitting: Emergency Medicine

## 2019-11-20 ENCOUNTER — Other Ambulatory Visit: Payer: Self-pay

## 2019-11-20 ENCOUNTER — Emergency Department: Payer: Medicare Other

## 2019-11-20 DIAGNOSIS — Z7901 Long term (current) use of anticoagulants: Secondary | ICD-10-CM | POA: Insufficient documentation

## 2019-11-20 DIAGNOSIS — N183 Chronic kidney disease, stage 3 unspecified: Secondary | ICD-10-CM | POA: Diagnosis not present

## 2019-11-20 DIAGNOSIS — Z87891 Personal history of nicotine dependence: Secondary | ICD-10-CM | POA: Insufficient documentation

## 2019-11-20 DIAGNOSIS — I5022 Chronic systolic (congestive) heart failure: Secondary | ICD-10-CM | POA: Insufficient documentation

## 2019-11-20 DIAGNOSIS — I2510 Atherosclerotic heart disease of native coronary artery without angina pectoris: Secondary | ICD-10-CM | POA: Diagnosis not present

## 2019-11-20 DIAGNOSIS — Z79899 Other long term (current) drug therapy: Secondary | ICD-10-CM | POA: Insufficient documentation

## 2019-11-20 DIAGNOSIS — I82622 Acute embolism and thrombosis of deep veins of left upper extremity: Secondary | ICD-10-CM

## 2019-11-20 DIAGNOSIS — I130 Hypertensive heart and chronic kidney disease with heart failure and stage 1 through stage 4 chronic kidney disease, or unspecified chronic kidney disease: Secondary | ICD-10-CM | POA: Insufficient documentation

## 2019-11-20 DIAGNOSIS — M79602 Pain in left arm: Secondary | ICD-10-CM | POA: Diagnosis present

## 2019-11-20 NOTE — ED Notes (Signed)
See triage note. Bruising noted to anterior LFA from elbow to wrist. Bruise is red/purple around elbow area and green/yellow toward wrist. Pt denies known injury. States he does take a blood thinner but is unsure of name.

## 2019-11-20 NOTE — ED Triage Notes (Signed)
Pt reports bruising with pain to left forearm x3-4 days  - Pt denies injury

## 2019-11-20 NOTE — Discharge Instructions (Addendum)
You have a nonocclusive thrombus of the left brachial, please follow-up with vascular to evaluate Take the Eliquis as previously instructed Return the emergency department if worsening

## 2019-11-20 NOTE — ED Provider Notes (Signed)
[facility] Emergency Department Provider Note  ____________________________________________   First MD Initiated Contact with Patient 11/20/19 1850     (approximate)  I have reviewed the triage vital signs and the nursing notes.   HISTORY  Chief Complaint Arm Pain    HPI Carl [name] is a 69 y.o. male presents emergency department complaining of bruising from the elbow to the mid forearm, patient states he is not sure if they drew blood from that arm or not.  He is a very poor historian.  He denies any fever or chills.  He does take a blood thinner but does not know the name.  He denies any fever or chills.    Past Medical History:  Diagnosis Date  . AICD (automatic cardioverter/defibrillator) present   . Anemia   . Anxiety   . Arthritis   . Brunner's gland hyperplasia of duodenum   . CHF (congestive heart failure) ([place])   . Coronary artery disease   . External hemorrhoids   . Fracture of femoral neck, left ([place]) 07/21/2018  . GERD (gastroesophageal reflux disease)   . Hearing loss   . HH (hiatus hernia)   . Internal hemorrhoids   . Ischemic cardiomyopathy   . Leg fracture, left   . Myocardial infarction ([place])   . Paronychia   . Pneumonia   . Psoriasis   . PUD (peptic ulcer disease)   . Schatzki's ring   . Tubular adenoma of colon   . Vitiligo     Patient Active Problem List   Diagnosis Date Noted  . HCAP (healthcare-associated pneumonia) 03/04/2019  . Hallucinations 02/10/2019  . Elevated blood sugar 01/27/2019  . S/P mitral valve repair 12/31/2018  . Bilateral pulmonary contusion 10/17/2018  . Cardiac LV ejection fraction of 20-34% 10/17/2018  . Chronic systolic CHF (congestive heart failure) ([place]) 10/17/2018  . History of ischemic cardiomyopathy 10/17/2018  . ICD (implantable cardioverter-defibrillator) in place 10/17/2018  . S/p left hip fracture 10/17/2018  . Traumatic retroperitoneal hematoma 10/17/2018  . Age-related  osteoporosis with current pathological fracture with routine healing 07/28/2018  . CAD (coronary artery disease) 07/21/2018  . GERD (gastroesophageal reflux disease) 07/21/2018  . Depression 07/21/2018  . CKD (chronic kidney disease), stage III 07/21/2018  . Iron deficiency anemia 07/21/2018  . Hypokalemia 07/21/2018  . Closed left hip fracture ([place]) 07/21/2018  . Fracture of femoral neck, left ([place]) 07/21/2018  . Lower extremity pain, bilateral 11/12/2017  . 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 Hyperlipidemia 09/01/2017  . Essential hypertension 09/01/2017  . Atherosclerotic peripheral vascular disease with intermittent claudication ([place]) 09/01/2017  . Anemia, unspecified 08/06/2017  . GAD (generalized anxiety disorder) 06/18/2017  . Prostate cancer screening 04/28/2017  . Hydrocele 04/28/2017  . Bradycardia   . Premature ventricular contraction   . Severe mitral insufficiency   . Cardiomyopathy, ischemic 04/26/2015  . Difficulty hearing 12/29/2014  . Acute on chronic systolic CHF (congestive heart failure) ([place]) 10/10/2014  . 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 Protein-calorie malnutrition, severe ([place]) 07/29/2014  . AKI (acute kidney injury) ([place]) 07/29/2014    Past Surgical History:  Procedure Laterality Date  . ATRIAL SEPTAL DEFECT(ASD) CLOSURE N/A 12/30/2018   Procedure: ATRIAL SEPTAL DEFECT(ASD) CLOSURE;  Surgeon: [name], MD;  Location: [place] CV LAB;  Service: Cardiovascular;  Laterality: N/A;  . CARDIAC SURGERY    . CARDIOVERSION N/A 11/09/2019   Procedure: CARDIOVERSION;  Surgeon: [name], MD;  Location: [facility] ENDOSCOPY;  Service:  Cardiovascular;  Laterality: N/A;  . COLONOSCOPY WITH ESOPHAGOGASTRODUODENOSCOPY (EGD)    . COLONOSCOPY WITH PROPOFOL N/A 08/24/2017   Procedure: COLONOSCOPY WITH PROPOFOL;  Surgeon: [name], MD;  Location: [facility] ENDOSCOPY;  Service: Endoscopy;  Laterality: N/A;  . CORONARY ARTERY BYPASS GRAFT N/A  08/03/2014   Procedure: CORONARY ARTERY BYPASS GRAFTING (CABG);  Surgeon: [name], MD;  Location: [place];  Service: Open Heart Surgery;  Laterality: N/A;  . EP IMPLANTABLE DEVICE N/A 05/01/2015   MDT ICD implanted for primary prevention of sudden death  . ESOPHAGOGASTRODUODENOSCOPY (EGD) WITH PROPOFOL N/A 04/16/2015   Procedure: ESOPHAGOGASTRODUODENOSCOPY (EGD) WITH PROPOFOL;  Surgeon: [name], MD;  Location: [facility] ENDOSCOPY;  Service: Endoscopy;  Laterality: N/A;  . ESOPHAGOGASTRODUODENOSCOPY (EGD) WITH PROPOFOL N/A 08/24/2017   Procedure: ESOPHAGOGASTRODUODENOSCOPY (EGD) WITH PROPOFOL;  Surgeon: [name], MD;  Location: [facility] ENDOSCOPY;  Service: Endoscopy;  Laterality: N/A;  . FRACTURE SURGERY    . HERNIA REPAIR    . HIP PINNING,CANNULATED Left 07/22/2018   Procedure: CANNULATED HIP PINNING;  Surgeon: [name], MD;  Location: [place];  Service: Orthopedics;  Laterality: Left;  . MITRAL VALVE REPAIR N/A 12/30/2018   Procedure: MITRAL VALVE REPAIR;  Surgeon: [name], MD;  Location: [place] CV LAB;  Service: Cardiovascular;  Laterality: N/A;  . RIGHT/LEFT HEART CATH AND CORONARY/GRAFT ANGIOGRAPHY N/A 12/21/2018   Procedure: RIGHT/LEFT HEART CATH AND CORONARY/GRAFT ANGIOGRAPHY;  Surgeon: [name], MD;  Location: [place] CV LAB;  Service: Cardiovascular;  Laterality: N/A;  . TEE WITHOUT CARDIOVERSION N/A 08/03/2014   Procedure: TRANSESOPHAGEAL ECHOCARDIOGRAM (TEE);  Surgeon: [name], MD;  Location: [place];  Service: Open Heart Surgery;  Laterality: N/A;  . TEE WITHOUT CARDIOVERSION N/A 02/10/2018   Procedure: TRANSESOPHAGEAL ECHOCARDIOGRAM (TEE);  Surgeon: [name], MD;  Location: [facility] ENDOSCOPY;  Service: Cardiovascular;  Laterality: N/A;  . TEE WITHOUT CARDIOVERSION N/A 11/09/2019   Procedure: TRANSESOPHAGEAL ECHOCARDIOGRAM (TEE);  Surgeon: [name], MD;  Location: [facility] ENDOSCOPY;  Service: Cardiovascular;  Laterality: N/A;     Prior to Admission medications   Medication Sig Start Date End Date Taking? Authorizing Provider  acetaminophen (TYLENOL) 325 MG tablet Take 325 mg by mouth every 6 (six) hours as needed for mild pain or moderate pain.     [provider]  allopurinol (ZYLOPRIM) 100 MG tablet Take 100 mg by mouth daily. 02/08/19   [provider]  amiodarone (PACERONE) 200 MG tablet Take (200mg ) 1 tab twice a day for 2 weeks THEN 200mg  (1 tab) daily after that 11/01/19   [name], MD  apixaban (ELIQUIS) 5 MG TABS tablet Take 1 tablet (5 mg total) by mouth 2 (two) times daily. 11/01/19   [name], MD  Ascorbic Acid (VITAMIN C) 100 MG tablet Take 100 mg by mouth daily.    [provider]  atorvastatin (LIPITOR) 40 MG tablet TAKE 1 TABLET (40 MG TOTAL) BY MOUTH DAILY AT 6 PM. Patient taking differently: Take 40 mg by mouth at bedtime.  09/14/19   [name], MD  carvedilol (COREG) 6.25 MG tablet Take 1 tablet (6.25 mg total) by mouth 2 (two) times daily. 03/22/19   [name], MD  Cholecalciferol (VITAMIN D3 SUPER STRENGTH) 50 MCG (2000 UT) TABS Take 2,000 Units by mouth at bedtime.     [provider]  cyanocobalamin 500 MCG tablet Take 500 mcg by mouth at bedtime.     [provider]  digoxin (  LANOXIN) 0.125 MG tablet Take 0.5 tablets (0.0625 mg total) by mouth every other day. 10/11/19   [name], MD  docusate sodium (COLACE) 100 MG capsule Take 100 mg by mouth daily.    [provider]  DULoxetine (CYMBALTA) 30 MG capsule Take 30 mg by mouth daily.    [provider]  eplerenone (INSPRA) 50 MG tablet TAKE 1 TABLET BY MOUTH EVERY DAY Patient taking differently: Take 50 mg by mouth daily.  09/06/19   [name], MD  ferrous sulfate 325 (65 FE) MG tablet Take 1 tablet (325 mg total) by mouth daily with breakfast. Patient taking differently: Take 325 mg by mouth daily.  02/09/18   [name], MD  gabapentin  (NEURONTIN) 300 MG capsule Take 300 mg by mouth at bedtime.     [provider]  ketoconazole (NIZORAL) 2 % shampoo Apply 1 application topically 2 (two) times a week.  09/28/18   [provider]  KLOR-CON M20 20 MEQ tablet TAKE 1 TAB BY MOUTH TWICE A DAY. TAKE 1 EXTRA TABLET ON WEDNESDAY MORNING W/ METOLAZONE Patient taking differently: Take 20 mEq by mouth See admin instructions. Taking 40 meq in morning and 20 meq in evening.   Extra 60 meq on Wednesday morning /with Metolazone. 06/27/19   [name], MD  metolazone (ZAROXOLYN) 2.5 MG tablet TAKE 1 TAB BY MOUTH ONCE A WEEK. WEDNESDAY MORNINGS. TAKE 1 EXTRA POTASSIUM TABLET WITH THIS Patient taking differently: Take 2.5 mg by mouth every Wednesday. Morning 05/30/19   [name], MD  Multiple Vitamin (MULTIVITAMIN) tablet Take 1 tablet by mouth daily.    [provider]  pantoprazole (PROTONIX) 40 MG tablet Take 1 tablet (40 mg total) by mouth daily. 03/31/19   [name], MD  torsemide (DEMADEX) 20 MG tablet TAKE 4 TABLETS (80 MG TOTAL) BY MOUTH 2 (TWO) TIMES DAILY. 11/07/19   [name], MD    Allergies Spironolactone  Family History  Problem Relation Age of Onset  . Valvular heart disease Mother        Ruptured valve  . CAD Father   . Heart Problems Brother        Stents x 4  . Diabetes Brother   . Prostate cancer Neg Hx   . Bladder Cancer Neg Hx   . Kidney cancer Neg Hx     Social History Social History   Tobacco Use  . Smoking status: Former Smoker    Quit date: 2014    Years since quitting: 7.2  . Smokeless tobacco: Never Used  . Tobacco comment: 07/30/2017 Quit in 2014  Substance Use Topics  . Alcohol use: No    Alcohol/week: 0.0 standard drinks  . Drug use: No    Review of Systems  Constitutional: No fever/chills Eyes: No visual changes. ENT: No sore throat. Respiratory: Denies cough Cardiovascular: Denies chest pain Gastrointestinal: Denies abdominal  pain Genitourinary: Negative for dysuria. Musculoskeletal: Negative for back pain.  Positive for left arm pain Skin: Negative for rash. Psychiatric: no mood changes,     ____________________________________________   PHYSICAL EXAM:  VITAL SIGNS: ED Triage Vitals [11/20/19 1836]  Enc Vitals Group     BP 129/90     Pulse Rate 68     Resp 17     Temp 97.8 F (36.6 C)     Temp Source Oral     SpO2 96 %     Weight 170 lb (77.1 kg)  Height 6\' 2"  (1.88 m)     Head Circumference      Peak Flow      Pain Score 4     Pain Loc      Pain Edu?      Excl. in [place]?     Constitutional: Alert and oriented. Well appearing and in no acute distress. Eyes: Conjunctivae are normal.  Head: Atraumatic. Nose: No congestion/rhinnorhea. Mouth/Throat: Mucous membranes are moist.   Neck:  supple no lymphadenopathy noted Cardiovascular: Normal rate, regular rhythm.  Respiratory: Normal respiratory effort.  No retractions GU: deferred Musculoskeletal: FROM all extremities, warm and well perfused, left arm has bruising starting from the antecubital to the lower forearm, area is a little swollen around the antecubital area, neurovascular is intact Neurologic:  Normal speech and language.  Skin:  Skin is warm, dry and intact. No rash noted.,  Bruising noted Psychiatric: Mood and affect are normal. Speech and behavior are normal.  ____________________________________________   LABS (all labs ordered are listed, but only abnormal results are displayed)  Labs Reviewed - No data to display ____________________________________________   ____________________________________________  RADIOLOGY  Ultrasound of the left upper extremity shows a nonocclusive thrombus in the brachial area above the antecubital  ____________________________________________   PROCEDURES  Procedure(s) performed: No  Procedures    ____________________________________________   INITIAL IMPRESSION /  ASSESSMENT AND PLAN / ED COURSE  Pertinent labs & imaging results that were available during my care of the patient were reviewed by me and considered in my medical decision making (see chart for details).   Patient 69 year old male presents emergency department with concerns of left arm bruising and swelling.  See HPI  Physical exam there is a large amount of bruising appreciated.  Starts at the antecubital goes to the lower forearm.  Neurovascular is intact.  Ultrasound of the left upper extremity was ordered to rule out DVT due to the amount of swelling  Ultrasound shows a nonocclusive thrombus in the brachial above the antecubital.  Patient is currently taking Eliquis per his chart.  I did call his brother at home for him to check his medication boxes.  He found an empty bottle of Eliquis and then check the boxes which did have the medication in it for his separate doses.  I explained to him that he is to continue taking this medication.  He should follow-up with his cardiologist as previously determined.  He is also to follow-up with vascular to evaluate the thrombus in his arm.  His sister and brother both state they understand and will comply of her instructions.  Patient was discharged in stable condition.    Carl [name] was evaluated in Emergency Department on 11/20/2019 for the symptoms described in the history of present illness. He was evaluated in the context of the global COVID-19 pandemic, which necessitated consideration that the patient might be at risk for infection with the SARS-CoV-2 virus that causes COVID-19. Institutional protocols and algorithms that pertain to the evaluation of patients at risk for COVID-19 are in a state of rapid change based on information released by regulatory bodies including the CDC and federal and state organizations. These policies and algorithms were followed during the patient's care in the ED.   As part of my medical decision making, I reviewed  the following data within the [place] History obtained from family, Nursing notes reviewed and incorporated, Old chart reviewed, Radiograph reviewed , Notes from prior ED visits and [place] Controlled Substance  Database  ____________________________________________   FINAL CLINICAL IMPRESSION(S) / ED DIAGNOSES  Final diagnoses:  Acute deep vein thrombosis (DVT) of brachial vein of left upper extremity (HCC)      NEW MEDICATIONS STARTED DURING THIS VISIT:  New Prescriptions   No medications on file     Note:  This document was prepared using Dragon voice recognition software and may include unintentional dictation errors.    Versie Starks, PA-C 11/20/19 2027    Lavonia Drafts, MD 11/20/19 2051

## 2019-11-21 ENCOUNTER — Encounter (INDEPENDENT_AMBULATORY_CARE_PROVIDER_SITE_OTHER): Payer: Self-pay | Admitting: Vascular Surgery

## 2019-11-21 ENCOUNTER — Ambulatory Visit (INDEPENDENT_AMBULATORY_CARE_PROVIDER_SITE_OTHER): Payer: Medicare Other | Admitting: Vascular Surgery

## 2019-11-21 VITALS — BP 110/63 | HR 86 | Resp 16 | Ht 72.0 in | Wt 162.6 lb

## 2019-11-21 DIAGNOSIS — K219 Gastro-esophageal reflux disease without esophagitis: Secondary | ICD-10-CM | POA: Diagnosis not present

## 2019-11-21 DIAGNOSIS — I25118 Atherosclerotic heart disease of native coronary artery with other forms of angina pectoris: Secondary | ICD-10-CM

## 2019-11-21 DIAGNOSIS — E785 Hyperlipidemia, unspecified: Secondary | ICD-10-CM

## 2019-11-21 DIAGNOSIS — I10 Essential (primary) hypertension: Secondary | ICD-10-CM

## 2019-11-21 DIAGNOSIS — I82622 Acute embolism and thrombosis of deep veins of left upper extremity: Secondary | ICD-10-CM | POA: Diagnosis not present

## 2019-11-22 ENCOUNTER — Telehealth (HOSPITAL_COMMUNITY): Payer: Self-pay

## 2019-11-22 ENCOUNTER — Other Ambulatory Visit: Payer: Self-pay

## 2019-11-22 ENCOUNTER — Encounter (HOSPITAL_COMMUNITY): Payer: Self-pay

## 2019-11-22 ENCOUNTER — Ambulatory Visit (HOSPITAL_COMMUNITY)
Admission: RE | Admit: 2019-11-22 | Discharge: 2019-11-22 | Disposition: A | Discharge status: Home / Self Care | Payer: Medicare Other | Source: Ambulatory Visit | Attending: Cardiology | Admitting: Cardiology

## 2019-11-22 VITALS — BP 106/68 | HR 70 | Wt 158.8 lb

## 2019-11-22 DIAGNOSIS — I5022 Chronic systolic (congestive) heart failure: Secondary | ICD-10-CM | POA: Insufficient documentation

## 2019-11-22 DIAGNOSIS — Z951 Presence of aortocoronary bypass graft: Secondary | ICD-10-CM | POA: Diagnosis not present

## 2019-11-22 DIAGNOSIS — I2510 Atherosclerotic heart disease of native coronary artery without angina pectoris: Secondary | ICD-10-CM | POA: Insufficient documentation

## 2019-11-22 DIAGNOSIS — I493 Ventricular premature depolarization: Secondary | ICD-10-CM | POA: Insufficient documentation

## 2019-11-22 DIAGNOSIS — Z79899 Other long term (current) drug therapy: Secondary | ICD-10-CM | POA: Diagnosis not present

## 2019-11-22 DIAGNOSIS — Z95810 Presence of automatic (implantable) cardiac defibrillator: Secondary | ICD-10-CM | POA: Diagnosis not present

## 2019-11-22 DIAGNOSIS — Z7901 Long term (current) use of anticoagulants: Secondary | ICD-10-CM | POA: Diagnosis not present

## 2019-11-22 DIAGNOSIS — Z8249 Family history of ischemic heart disease and other diseases of the circulatory system: Secondary | ICD-10-CM | POA: Diagnosis not present

## 2019-11-22 DIAGNOSIS — N183 Chronic kidney disease, stage 3 unspecified: Secondary | ICD-10-CM | POA: Insufficient documentation

## 2019-11-22 DIAGNOSIS — I4819 Other persistent atrial fibrillation: Secondary | ICD-10-CM | POA: Diagnosis not present

## 2019-11-22 DIAGNOSIS — F329 Major depressive disorder, single episode, unspecified: Secondary | ICD-10-CM | POA: Insufficient documentation

## 2019-11-22 DIAGNOSIS — Z833 Family history of diabetes mellitus: Secondary | ICD-10-CM | POA: Insufficient documentation

## 2019-11-22 DIAGNOSIS — I130 Hypertensive heart and chronic kidney disease with heart failure and stage 1 through stage 4 chronic kidney disease, or unspecified chronic kidney disease: Secondary | ICD-10-CM | POA: Diagnosis not present

## 2019-11-22 DIAGNOSIS — I340 Nonrheumatic mitral (valve) insufficiency: Secondary | ICD-10-CM | POA: Insufficient documentation

## 2019-11-22 DIAGNOSIS — F70 Mild intellectual disabilities: Secondary | ICD-10-CM | POA: Diagnosis not present

## 2019-11-22 DIAGNOSIS — K7460 Unspecified cirrhosis of liver: Secondary | ICD-10-CM | POA: Insufficient documentation

## 2019-11-22 DIAGNOSIS — I255 Ischemic cardiomyopathy: Secondary | ICD-10-CM | POA: Insufficient documentation

## 2019-11-22 DIAGNOSIS — Z87891 Personal history of nicotine dependence: Secondary | ICD-10-CM | POA: Insufficient documentation

## 2019-11-22 LAB — BASIC METABOLIC PANEL
Anion gap: 14 (ref 5–15)
BUN: 61 mg/dL — ABNORMAL HIGH (ref 8–23)
CO2: 29 mmol/L (ref 22–32)
Calcium: 9.3 mg/dL (ref 8.9–10.3)
Chloride: 84 mmol/L — ABNORMAL LOW (ref 98–111)
Creatinine, Ser: 2.52 mg/dL — ABNORMAL HIGH (ref 0.61–1.24)
GFR calc Af Amer: 29 mL/min — ABNORMAL LOW (ref >60)
GFR calc non Af Amer: 25 mL/min — ABNORMAL LOW (ref >60)
Glucose, Bld: 130 mg/dL — ABNORMAL HIGH (ref 70–99)
Potassium: 3.9 mmol/L (ref 3.5–5.1)
Sodium: 127 mmol/L — ABNORMAL LOW (ref 135–145)

## 2019-11-22 LAB — CBC
HCT: 38.2 % — ABNORMAL LOW (ref 39.0–52.0)
Hemoglobin: 12.9 g/dL — ABNORMAL LOW (ref 13.0–17.0)
MCH: 33.3 pg (ref 26.0–34.0)
MCHC: 33.8 g/dL (ref 30.0–36.0)
MCV: 98.7 fL (ref 80.0–100.0)
Platelets: 309 10*3/uL (ref 150–400)
RBC: 3.87 MIL/uL — ABNORMAL LOW (ref 4.22–5.81)
RDW: 14 % (ref 11.5–15.5)
WBC: 8.7 10*3/uL (ref 4.0–10.5)
nRBC: 0 % (ref 0.0–0.2)

## 2019-11-22 MED ORDER — AMIODARONE HCL 200 MG PO TABS: 200.0000 mg | ORAL_TABLET | Freq: Every day | ORAL | 1 refills | Status: DC
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
## 2019-11-22 NOTE — Patient Instructions (Signed)
Decrease Amiodarone to 200 mg daily  Labs done today, your results will be available in MyChart, we will contact you for abnormal readings.  Your physician recommends that you schedule a follow-up appointment in: 6-8 weeks  If you have any questions or concerns before your next appointment please send [place] a message through [place] or call our office at [phone].  At the [facility], you and your health needs are our priority. As part of our continuing mission to provide you with exceptional heart care, we have created designated Provider Care Teams. These Care Teams include your primary Cardiologist (physician) and Advanced Practice Providers (APPs- Physician Assistants and Nurse Practitioners) who all work together to provide you with the care you need, when you need it.   You may see any of the following providers on your designated Care Team at your next follow up: Marland Kitchen Dr [name] . Dr [name] . Darrick Grinder, NP . Lyda Jester, PA . Audry Riles, PharmD   Please be sure to bring in all your medications bottles to every appointment.

## 2019-11-22 NOTE — Progress Notes (Signed)
Called pt's community paramedic Kristi [phone]) regarding decreasing Amio, she states she has already adjusted pill box accordingly

## 2019-11-23 ENCOUNTER — Other Ambulatory Visit (HOSPITAL_COMMUNITY): Payer: Self-pay

## 2019-11-23 ENCOUNTER — Telehealth (HOSPITAL_COMMUNITY): Payer: Self-pay

## 2019-11-23 DIAGNOSIS — I5022 Chronic systolic (congestive) heart failure: Secondary | ICD-10-CM

## 2019-11-23 MED ORDER — TORSEMIDE 20 MG PO TABS: 60.0000 mg | ORAL_TABLET | Freq: Two times a day (BID) | ORAL | 0 refills | Status: DC

## 2019-11-23 NOTE — Progress Notes (Signed)
Went out to [place] today to make changes to his medication boxes.  Kongmeng is off a day in his med box.  Izais can not explain what happened.  He is taking Thursday today instead of Wednesday.  Spoke with [name] also, if so that means Lavarr took his metolazone yesterday also.  Did change his med boxes back right so he will be back on track.  Skipped this evening torsemide and tomorrow and started torsemide back at 60 mg twice a day after that.  Reminded Adonay Pogany brother he needs blood work next week and either I will have to make weekly visits to see Tammer or if could when he visits during the week to make sure he is taking them on track.  Discussed with [name] again if he has found someone to come and be with [name] during the day to help him out, he has not.  Im afraid that it is getting to the point that Yarnell is unable to pull his medications out of the boxes his self.  He is getting more confused with them.  Did advise [name] and he agrees that Jaggar is getting more confused with things.  Will continue to try to help with his medications but unfortunate can not be there several times a week.  Will make a home visit on Monday to refill 2 weeks of medications and see if they stay in order til then.   [place] [phone]

## 2019-11-23 NOTE — Telephone Encounter (Signed)
-----   Message from [name], MD sent at 11/22/2019  9:11 PM EDT ----- Hold torsemide for a day, decrease to 60 mg bid when restarted.  Skip the next dose of metolazone then resume. BMET 1 week.

## 2019-11-23 NOTE — Telephone Encounter (Signed)
Spoke to Omega and was asking him if he was still cutting back on pepsi.  He states been drinking only 1 a day.  He states been drinking more water.  Then he went into saying that he is taking his morning and evening meds together.  Advised him he can not do that and why he was.  He states his doctor he seen on Monday told him to.  I advised him he has to take them at the times in his med box because they were some of the same meds. He took all his torsemide today at one time.  Told him to take like I put in his box.  He said I needed to talk to his brother [name] because he heard it too.  Contacted [name] and advised him, he said Willmer was confused, the doctor told him he could take his evening and night one time only together because it was getting late, it was when he went to ED on Sunday.  Julio Sicks he needed to contact [facility] and advise him to take separately and advised [name] why.  He states he will.  Will contact [name] tomorrow and ask about his meds to make sure he is taking them right.   [place] [phone]

## 2019-11-26 ENCOUNTER — Encounter (INDEPENDENT_AMBULATORY_CARE_PROVIDER_SITE_OTHER): Payer: Self-pay | Admitting: Vascular Surgery

## 2019-11-26 DIAGNOSIS — I82622 Acute embolism and thrombosis of deep veins of left upper extremity: Secondary | ICD-10-CM | POA: Insufficient documentation

## 2019-11-26 NOTE — Progress Notes (Signed)
MRN : [id number]  Carl [name] is a 69 y.o. (1951-04-17) male who presents with chief complaint of  Chief Complaint  Patient presents with  . Follow-up    ER follow up lue dvt brachial vein  .  History of Present Illness:   The patient presents to the office for evaluation of left arm DVT.  DVT was identified in the ER at [facility] by Duplex ultrasound.  The initial symptoms were pain and swelling in the upper extremity.  The patient notes the arm continues to be very painful with dependency and is still swollen.  Symptoms are much better with elevation.  The patient notes minimal edema in the morning which steadily worsens throughout the day.    The patient has not been using compression therapy at this point.  No SOB or pleuritic chest pains.  No cough or hemoptysis.  No blood per rectum or blood in any sputum.  No excessive bruising per the patient.   Current Meds  Medication Sig  . acetaminophen (TYLENOL) 325 MG tablet Take 325 mg by mouth every 6 (six) hours as needed for mild pain or moderate pain.   Marland Kitchen allopurinol (ZYLOPRIM) 100 MG tablet Take 100 mg by mouth daily.  Marland Kitchen apixaban (ELIQUIS) 5 MG TABS tablet Take 1 tablet (5 mg total) by mouth 2 (two) times daily.  . Ascorbic Acid (VITAMIN C) 100 MG tablet Take 100 mg by mouth daily.  Marland Kitchen atorvastatin (LIPITOR) 40 MG tablet TAKE 1 TABLET (40 MG TOTAL) BY MOUTH DAILY AT 6 PM. (Patient taking differently: Take 40 mg by mouth at bedtime. )  . carvedilol (COREG) 6.25 MG tablet Take 1 tablet (6.25 mg total) by mouth 2 (two) times daily.  . Cholecalciferol (VITAMIN D3 SUPER STRENGTH) 50 MCG (2000 UT) TABS Take 2,000 Units by mouth at bedtime.   . cyanocobalamin 500 MCG tablet Take 500 mcg by mouth at bedtime.   . digoxin (LANOXIN) 0.125 MG tablet Take 0.5 tablets (0.0625 mg total) by mouth every other day.  . docusate sodium (COLACE) 100 MG capsule Take 100 mg by mouth daily.  . DULoxetine (CYMBALTA) 30 MG capsule Take 30 mg by mouth  daily.  Marland Kitchen eplerenone (INSPRA) 50 MG tablet TAKE 1 TABLET BY MOUTH EVERY DAY (Patient taking differently: Take 50 mg by mouth daily. )  . ferrous sulfate 325 (65 FE) MG tablet Take 1 tablet (325 mg total) by mouth daily with breakfast. (Patient taking differently: Take 325 mg by mouth daily. )  . gabapentin (NEURONTIN) 300 MG capsule Take 300 mg by mouth at bedtime.   Marland Kitchen ketoconazole (NIZORAL) 2 % shampoo Apply 1 application topically 2 (two) times a week.   Marland Kitchen KLOR-CON M20 20 MEQ tablet TAKE 1 TAB BY MOUTH TWICE A DAY. TAKE 1 EXTRA TABLET ON WEDNESDAY MORNING W/ METOLAZONE (Patient taking differently: Take 20 mEq by mouth See admin instructions. Taking 40 meq in morning and 20 meq in evening.   Extra 60 meq on Wednesday morning /with Metolazone.)  . metolazone (ZAROXOLYN) 2.5 MG tablet TAKE 1 TAB BY MOUTH ONCE A WEEK. WEDNESDAY MORNINGS. TAKE 1 EXTRA POTASSIUM TABLET WITH THIS (Patient taking differently: Take 2.5 mg by mouth every Wednesday. Morning)  . Multiple Vitamin (MULTIVITAMIN) tablet Take 1 tablet by mouth daily.  . pantoprazole (PROTONIX) 40 MG tablet Take 1 tablet (40 mg total) by mouth daily.  . [DISCONTINUED] amiodarone (PACERONE) 200 MG tablet Take (200mg ) 1 tab twice a day for 2 weeks THEN  200mg  (1 tab) daily after that  . [DISCONTINUED] torsemide (DEMADEX) 20 MG tablet TAKE 4 TABLETS (80 MG TOTAL) BY MOUTH 2 (TWO) TIMES DAILY.    Past Medical History:  Diagnosis Date  . AICD (automatic cardioverter/defibrillator) present   . Anemia   . Anxiety   . Arthritis   . Brunner's gland hyperplasia of duodenum   . CHF (congestive heart failure) ([place])   . Coronary artery disease   . External hemorrhoids   . Fracture of femoral neck, left ([place]) 07/21/2018  . GERD (gastroesophageal reflux disease)   . Hearing loss   . HH (hiatus hernia)   . Internal hemorrhoids   . Ischemic cardiomyopathy   . Leg fracture, left   . Myocardial infarction ([name])   . Paronychia   . Pneumonia   .  Psoriasis   . PUD (peptic ulcer disease)   . Schatzki's ring   . Tubular adenoma of colon   . Vitiligo     Past Surgical History:  Procedure Laterality Date  . ATRIAL SEPTAL DEFECT(ASD) CLOSURE N/A 12/30/2018   Procedure: ATRIAL SEPTAL DEFECT(ASD) CLOSURE;  Surgeon: [name], MD;  Location: [place] [facility] LAB;  Service: Cardiovascular;  Laterality: N/A;  . CARDIAC SURGERY    . CARDIOVERSION N/A 11/09/2019   Procedure: CARDIOVERSION;  Surgeon: [name], MD;  Location: [facility] ENDOSCOPY;  Service: Cardiovascular;  Laterality: N/A;  . COLONOSCOPY WITH ESOPHAGOGASTRODUODENOSCOPY (EGD)    . COLONOSCOPY WITH PROPOFOL N/A 08/24/2017   Procedure: COLONOSCOPY WITH PROPOFOL;  Surgeon: [name], MD;  Location: [facility] ENDOSCOPY;  Service: Endoscopy;  Laterality: N/A;  . CORONARY ARTERY BYPASS GRAFT N/A 08/03/2014   Procedure: CORONARY ARTERY BYPASS GRAFTING (CABG);  Surgeon: [name], MD;  Location: [place];  Service: Open Heart Surgery;  Laterality: N/A;  . EP IMPLANTABLE DEVICE N/A 05/01/2015   MDT ICD implanted for primary prevention of sudden death  . ESOPHAGOGASTRODUODENOSCOPY (EGD) WITH PROPOFOL N/A 04/16/2015   Procedure: ESOPHAGOGASTRODUODENOSCOPY (EGD) WITH PROPOFOL;  Surgeon: [name], MD;  Location: [facility] ENDOSCOPY;  Service: Endoscopy;  Laterality: N/A;  . ESOPHAGOGASTRODUODENOSCOPY (EGD) WITH PROPOFOL N/A 08/24/2017   Procedure: ESOPHAGOGASTRODUODENOSCOPY (EGD) WITH PROPOFOL;  Surgeon: [name], MD;  Location: [facility] ENDOSCOPY;  Service: Endoscopy;  Laterality: N/A;  . FRACTURE SURGERY    . HERNIA REPAIR    . HIP PINNING,CANNULATED Left 07/22/2018   Procedure: CANNULATED HIP PINNING;  Surgeon: [name], MD;  Location: [place];  Service: Orthopedics;  Laterality: Left;  . MITRAL VALVE REPAIR N/A 12/30/2018   Procedure: MITRAL VALVE REPAIR;  Surgeon: [name], MD;  Location: [place] [facility] LAB;  Service: Cardiovascular;  Laterality: N/A;  .  RIGHT/LEFT HEART CATH AND CORONARY/GRAFT ANGIOGRAPHY N/A 12/21/2018   Procedure: RIGHT/LEFT HEART CATH AND CORONARY/GRAFT ANGIOGRAPHY;  Surgeon: [name], MD;  Location: [place] [facility] LAB;  Service: Cardiovascular;  Laterality: N/A;  . TEE WITHOUT CARDIOVERSION N/A 08/03/2014   Procedure: TRANSESOPHAGEAL ECHOCARDIOGRAM (TEE);  Surgeon: [name], MD;  Location: [place];  Service: Open Heart Surgery;  Laterality: N/A;  . TEE WITHOUT CARDIOVERSION N/A 02/10/2018   Procedure: TRANSESOPHAGEAL ECHOCARDIOGRAM (TEE);  Surgeon: [name], MD;  Location: [place] ENDOSCOPY;  Service: Cardiovascular;  Laterality: N/A;  . TEE WITHOUT CARDIOVERSION N/A 11/09/2019   Procedure: TRANSESOPHAGEAL ECHOCARDIOGRAM (TEE);  Surgeon: [name], MD;  Location: [facility] ENDOSCOPY;  Service: Cardiovascular;  Laterality: N/A;    Social History Social History   Tobacco Use  . Smoking status:  Former Smoker    Quit date: 2014    Years since quitting: 7.2  . Smokeless tobacco: Never Used  . Tobacco comment: 07/30/2017 Quit in 2014  Substance Use Topics  . Alcohol use: No    Alcohol/week: 0.0 standard drinks  . Drug use: No    Family History Family History  Problem Relation Age of Onset  . Valvular heart disease Mother        Ruptured valve  . CAD Father   . Heart Problems Brother        Stents x 4  . Diabetes Brother   . Prostate cancer Neg Hx   . Bladder Cancer Neg Hx   . Kidney cancer Neg Hx   No family history of bleeding/clotting disorders, porphyria or autoimmune disease   Allergies  Allergen Reactions  . Spironolactone Other (See Comments)    gynecomastia      REVIEW OF SYSTEMS (Negative unless checked)  Constitutional: [] Weight loss  [] Fever  [] Chills Cardiac: [] Chest pain   [] Chest pressure   [] Palpitations   [] Shortness of breath when laying flat   [] Shortness of breath with exertion. Vascular:  [] Pain in legs with walking   [] Pain in legs at rest  [x] History of DVT    [] Phlebitis   [] Swelling in legs   [] Varicose veins   [] Non-healing ulcers Pulmonary:   [] Uses home oxygen   [] Productive cough   [] Hemoptysis   [] Wheeze  [] COPD   [] Asthma Neurologic:  [] Dizziness   [] Seizures   [] History of stroke   [] History of TIA  [] Aphasia   [] Vissual changes   [] Weakness or numbness in arm   [] Weakness or numbness in leg Musculoskeletal:   [] Joint swelling   [] Joint pain   [] Low back pain Hematologic:  [] Easy bruising  [] Easy bleeding   [] Hypercoagulable state   [] Anemic Gastrointestinal:  [] Diarrhea   [] Vomiting  [x] Gastroesophageal reflux/heartburn   [] Difficulty swallowing. Genitourinary:  [] Chronic kidney disease   [] Difficult urination  [] Frequent urination   [] Blood in urine Skin:  [] Rashes   [] Ulcers  Psychological:  [] History of anxiety   []  History of major depression.  Physical Examination  Vitals:   11/21/19 1115  BP: 110/63  Pulse: 86  Resp: 16  Weight: 162 lb 9.6 oz (73.8 kg)  Height: 6' (1.829 m)   Body mass index is 22.05 kg/m. Gen: WD/WN, NAD Head: [place]/AT, No temporalis wasting.  Ear/Nose/Throat: Hearing grossly intact, nares w/o erythema or drainage, poor dentition Eyes: PER, EOMI, sclera nonicteric.  Neck: Supple, no masses.  No bruit or JVD.  Pulmonary:  Good air movement, clear to auscultation bilaterally, no use of accessory muscles.  Cardiac: RRR, normal S1, S2, no Murmurs. Vascular:  Left arm tender and increased edema compared to the right Vessel Right Left  Radial Palpable Palpable  Brachial Palpable Palpable  Gastrointestinal: soft, non-distended. No guarding/no peritoneal signs.  Musculoskeletal: M/S 5/5 throughout.  No deformity or atrophy.  Neurologic: CN 2-12 intact. Pain and light touch intact in extremities.  Symmetrical.  Speech is fluent. Motor exam as listed above. Psychiatric: Judgment intact, Mood & affect appropriate for pt's clinical situation. Dermatologic: No rashes or ulcers noted.  No changes consistent with  cellulitis.  CBC Lab Results  Component Value Date   WBC 8.7 11/22/2019   HGB 12.9 (L) 11/22/2019   HCT 38.2 (L) 11/22/2019   MCV 98.7 11/22/2019   PLT 309 11/22/2019    BMET    Component Value Date/Time   NA 127 (L) 11/22/2019  1443   NA 136 10/14/2019 1451   NA 136 07/27/2014 0203   K 3.9 11/22/2019 1443   K 4.8 07/27/2014 0203   CL 84 (L) 11/22/2019 1443   CL 97 (L) 07/27/2014 0203   CO2 29 11/22/2019 1443   CO2 28 07/27/2014 0203   GLUCOSE 130 (H) 11/22/2019 1443   GLUCOSE 131 (H) 07/27/2014 0203   BUN 61 (H) 11/22/2019 1443   BUN 23 10/14/2019 1451   BUN 25 (H) 07/27/2014 0203   CREATININE 2.52 (H) 11/22/2019 1443   CREATININE 1.60 (H) 07/27/2014 0203   CALCIUM 9.3 11/22/2019 1443   CALCIUM 8.8 07/27/2014 0203   GFRNONAA 25 (L) 11/22/2019 1443   GFRNONAA 47 (L) 07/27/2014 0203   GFRAA 29 (L) 11/22/2019 1443   GFRAA 57 (L) 07/27/2014 0203   Estimated Creatinine Clearance: 29.3 mL/min (A) (by C-G formula based on SCr of 2.52 mg/dL (H)).  COAG Lab Results  Component Value Date   INR 1.4 (H) 12/27/2018   INR 1.21 07/21/2018   INR 1.27 08/03/2014    Radiology US Venous Img Upper Uni Left  Result Date: 11/20/2019 CLINICAL DATA:  Soft tissue swelling and arm pain EXAM: LEFT UPPER EXTREMITY VENOUS DOPPLER ULTRASOUND TECHNIQUE: Gray-scale sonography with graded compression, as well as color Doppler and duplex ultrasound were performed to evaluate the upper extremity deep venous system from the level of the subclavian vein and including the jugular, axillary, basilic, radial, ulnar and upper cephalic vein. Spectral Doppler was utilized to evaluate flow at rest and with distal augmentation maneuvers. COMPARISON:  None. FINDINGS: Contralateral Subclavian Vein: Respiratory phasicity is normal and symmetric with the symptomatic side. No evidence of thrombus. Normal compressibility. Internal Jugular Vein: No evidence of thrombus. Normal compressibility, respiratory phasicity  and response to augmentation. Subclavian Vein: No evidence of thrombus. Normal compressibility, respiratory phasicity and response to augmentation. Axillary Vein: No evidence of thrombus. Normal compressibility, respiratory phasicity and response to augmentation. Cephalic Vein: No evidence of thrombus. Normal compressibility, respiratory phasicity and response to augmentation. Basilic Vein: No evidence of thrombus. Normal compressibility, respiratory phasicity and response to augmentation. Brachial Veins: Nonocclusive thrombus is noted within the brachial vein near the antecubital fossa. Radial Veins: No evidence of thrombus. Normal compressibility, respiratory phasicity and response to augmentation. Ulnar Veins: No evidence of thrombus. Normal compressibility, respiratory phasicity and response to augmentation. Venous Reflux:  None visualized. Other Findings:  None visualized. IMPRESSION: Nonocclusive thrombus within the left brachial vein near the antecubital fossa Electronically Signed   By: Inez Catalina M.D.   On: 11/20/2019 20:05     Assessment/Plan 1. Arm DVT (deep venous thromboembolism), acute, left (HCC) Recommend:   No surgery or intervention at this point in time.  IVC filter is not indicated at present.  Patient's duplex ultrasound of the venous system shows DVT in the left brachial vein.  The patient is initiated on anticoagulation   Elevation was stressed.  I have had a long discussion with the patient regarding DVT and post phlebitic changes such as swelling and why it  causes symptoms such as pain.  The patient will wear graduated compression sleeve as tolerated on a daily basis a prescription was given. The patient will  beginning wearing the stockings first thing in the morning and removing them in the evening. The patient is instructed specifically not to sleep in the stockings.  In addition, behavioral modification including elevation during the day and avoidance of prolonged  dependency will be initiated.  The patient will continue anticoagulation for now as there have not been any problems or complications at this point.   - VAS [place] UPPER EXTREMITY VENOUS DUPLEX; Future  2. Coronary artery disease of native artery of native heart with stable angina pectoris (HCC) Continue cardiac and antihypertensive medications as already ordered and reviewed, no changes at this time.  Continue statin as ordered and reviewed, no changes at this time  Nitrates PRN for chest pain   3. Essential hypertension Continue antihypertensive medications as already ordered, these medications have been reviewed and there are no changes at this time.   4. Gastroesophageal reflux disease without esophagitis Continue PPI as already ordered, this medication has been reviewed and there are no changes at this time.  Avoidence of caffeine and alcohol  Moderate elevation of the head of the bed   5. Hyperlipidemia, unspecified hyperlipidemia type Continue statin as ordered and reviewed, no changes at this time     Hortencia Pilar, MD  11/26/2019 12:37 PM

## 2019-11-28 ENCOUNTER — Telehealth: Payer: Medicare Other | Admitting: Internal Medicine

## 2019-11-28 ENCOUNTER — Other Ambulatory Visit: Payer: Self-pay

## 2019-11-28 ENCOUNTER — Other Ambulatory Visit (HOSPITAL_COMMUNITY): Payer: Self-pay

## 2019-11-28 ENCOUNTER — Encounter (HOSPITAL_COMMUNITY): Payer: Self-pay

## 2019-11-28 NOTE — Progress Notes (Signed)
Today had a home visit with Carl [name].  He states doing ok today.  His brother in law passed away and the funeral is this week.  He is very talkative today.  Verified his medications and filled his medication boxes.  No edema in extremities.  Lungs are clear.  He denies any chest pain, shortness of breath, headaches or dizziness.  He appears well nourished and he has been watching his sodas.  He states been drinking water more.   Will continue to visit for heart failure, medication compliance and diet.   [place] [phone]

## 2019-11-29 ENCOUNTER — Ambulatory Visit (HOSPITAL_COMMUNITY): Payer: Medicare Other | Admitting: Physician Assistant

## 2019-11-30 ENCOUNTER — Ambulatory Visit (HOSPITAL_COMMUNITY)
Admission: RE | Admit: 2019-11-30 | Discharge: 2019-11-30 | Disposition: A | Discharge status: Home / Self Care | Payer: Medicare Other | Source: Ambulatory Visit | Attending: Physician Assistant | Admitting: Physician Assistant

## 2019-11-30 ENCOUNTER — Other Ambulatory Visit: Payer: Self-pay

## 2019-11-30 ENCOUNTER — Encounter (HOSPITAL_COMMUNITY): Payer: Self-pay | Admitting: Physician Assistant

## 2019-11-30 ENCOUNTER — Other Ambulatory Visit: Payer: Self-pay | Admitting: Cardiovascular Disease

## 2019-11-30 VITALS — BP 92/60 | HR 62 | Ht 72.0 in | Wt 162.2 lb

## 2019-11-30 DIAGNOSIS — I2510 Atherosclerotic heart disease of native coronary artery without angina pectoris: Secondary | ICD-10-CM | POA: Diagnosis not present

## 2019-11-30 DIAGNOSIS — I4819 Other persistent atrial fibrillation: Secondary | ICD-10-CM | POA: Diagnosis present

## 2019-11-30 DIAGNOSIS — Z951 Presence of aortocoronary bypass graft: Secondary | ICD-10-CM | POA: Diagnosis not present

## 2019-11-30 DIAGNOSIS — Z8249 Family history of ischemic heart disease and other diseases of the circulatory system: Secondary | ICD-10-CM | POA: Insufficient documentation

## 2019-11-30 DIAGNOSIS — D6869 Other thrombophilia: Secondary | ICD-10-CM | POA: Insufficient documentation

## 2019-11-30 DIAGNOSIS — Z87891 Personal history of nicotine dependence: Secondary | ICD-10-CM | POA: Insufficient documentation

## 2019-11-30 DIAGNOSIS — N189 Chronic kidney disease, unspecified: Secondary | ICD-10-CM | POA: Diagnosis not present

## 2019-11-30 DIAGNOSIS — I255 Ischemic cardiomyopathy: Secondary | ICD-10-CM | POA: Diagnosis not present

## 2019-11-30 DIAGNOSIS — I252 Old myocardial infarction: Secondary | ICD-10-CM | POA: Diagnosis not present

## 2019-11-30 DIAGNOSIS — K219 Gastro-esophageal reflux disease without esophagitis: Secondary | ICD-10-CM | POA: Diagnosis not present

## 2019-11-30 DIAGNOSIS — Z7901 Long term (current) use of anticoagulants: Secondary | ICD-10-CM | POA: Diagnosis not present

## 2019-11-30 DIAGNOSIS — I5022 Chronic systolic (congestive) heart failure: Secondary | ICD-10-CM | POA: Insufficient documentation

## 2019-11-30 DIAGNOSIS — Z79899 Other long term (current) drug therapy: Secondary | ICD-10-CM | POA: Diagnosis not present

## 2019-11-30 DIAGNOSIS — Z95810 Presence of automatic (implantable) cardiac defibrillator: Secondary | ICD-10-CM | POA: Diagnosis not present

## 2019-11-30 LAB — CBC
HCT: 36 % — ABNORMAL LOW (ref 39.0–52.0)
Hemoglobin: 11.6 g/dL — ABNORMAL LOW (ref 13.0–17.0)
MCH: 33.2 pg (ref 26.0–34.0)
MCHC: 32.2 g/dL (ref 30.0–36.0)
MCV: 103.2 fL — ABNORMAL HIGH (ref 80.0–100.0)
Platelets: 360 10*3/uL (ref 150–400)
RBC: 3.49 MIL/uL — ABNORMAL LOW (ref 4.22–5.81)
RDW: 14.3 % (ref 11.5–15.5)
WBC: 8.1 10*3/uL (ref 4.0–10.5)
nRBC: 0 % (ref 0.0–0.2)

## 2019-11-30 LAB — BASIC METABOLIC PANEL
Anion gap: 11 (ref 5–15)
BUN: 38 mg/dL — ABNORMAL HIGH (ref 8–23)
CO2: 30 mmol/L (ref 22–32)
Calcium: 9.3 mg/dL (ref 8.9–10.3)
Chloride: 90 mmol/L — ABNORMAL LOW (ref 98–111)
Creatinine, Ser: 1.87 mg/dL — ABNORMAL HIGH (ref 0.61–1.24)
GFR calc Af Amer: 42 mL/min — ABNORMAL LOW (ref >60)
GFR calc non Af Amer: 36 mL/min — ABNORMAL LOW (ref >60)
Glucose, Bld: 112 mg/dL — ABNORMAL HIGH (ref 70–99)
Potassium: 4.6 mmol/L (ref 3.5–5.1)
Sodium: 131 mmol/L — ABNORMAL LOW (ref 135–145)

## 2019-11-30 NOTE — H&P (View-Only) (Signed)
Primary Care Physician: [name], MD Primary Cardiologist: Dr [name] Primary Electrophysiologist: [name] Referring Physician: Dr [name] is a 69 y.o. male with a history of CAD, chronic systolic CHF s/p ICD, MR s/p Mitraclip, CKD, persistent atrial fibrillation, cirrhosis, who presents for follow up in the [facility].  The patient was initially diagnosed with atrial fibrillation 09/2019 on heart monitoring. He was started on Eliquis for a CHADS2VASC score of 3 and amiodarone. Burden on Zio patch was 5%. On follow up with Dr [name], he had been in persistent afib for two weeks and underwent TEE/DCCV on 11/09/19. Unfortunately, the device clinic received an alert on 11/18/19 that he was back in afib. Patient denies awareness of his arrhythmia. He is a difficult historian with numerous somatic complaints. He remains in rate controlled afib today.  Today, he denies symptoms of palpitations, chest pain, shortness of breath, orthopnea, PND, lower extremity edema, dizziness, presyncope, syncope, snoring, daytime somnolence, bleeding, or neurologic sequela. The patient is tolerating medications without difficulties and is otherwise without complaint today.    Atrial Fibrillation Risk Factors:  he does not have symptoms or diagnosis of sleep apnea. .  he has a BMI of Body mass index is 22 kg/m.Marland Kitchen Filed Weights   11/30/19 1345  Weight: 73.6 kg    Family History  Problem Relation Age of Onset  . Valvular heart disease Mother        Ruptured valve  . CAD Father   . Heart Problems Brother        Stents x 4  . Diabetes Brother   . Prostate cancer Neg Hx   . Bladder Cancer Neg Hx   . Kidney cancer Neg Hx      Atrial Fibrillation Management history:  Previous antiarrhythmic drugs: amiodarone Previous cardioversions: 11/09/19 Previous ablations: none CHADS2VASC score: 3 Anticoagulation history: Eliquis   Past Medical History:   Diagnosis Date  . AICD (automatic cardioverter/defibrillator) present   . Anemia   . Anxiety   . Arthritis   . Brunner's gland hyperplasia of duodenum   . CHF (congestive heart failure) ([place])   . Coronary artery disease   . External hemorrhoids   . Fracture of femoral neck, left ([place]) 07/21/2018  . GERD (gastroesophageal reflux disease)   . Hearing loss   . HH (hiatus hernia)   . Internal hemorrhoids   . Ischemic cardiomyopathy   . Leg fracture, left   . Myocardial infarction ([place])   . Paronychia   . Pneumonia   . Psoriasis   . PUD (peptic ulcer disease)   . Schatzki's ring   . Tubular adenoma of colon   . Vitiligo    Past Surgical History:  Procedure Laterality Date  . ATRIAL SEPTAL DEFECT(ASD) CLOSURE N/A 12/30/2018   Procedure: ATRIAL SEPTAL DEFECT(ASD) CLOSURE;  Surgeon: [name], MD;  Location: [facility];  Service: Cardiovascular;  Laterality: N/A;  . CARDIAC SURGERY    . CARDIOVERSION N/A 11/09/2019   Procedure: CARDIOVERSION;  Surgeon: [name], MD;  Location: [facility] ENDOSCOPY;  Service: Cardiovascular;  Laterality: N/A;  . COLONOSCOPY WITH ESOPHAGOGASTRODUODENOSCOPY (EGD)    . COLONOSCOPY WITH PROPOFOL N/A 08/24/2017   Procedure: COLONOSCOPY WITH PROPOFOL;  Surgeon: [name], MD;  Location: [facility] ENDOSCOPY;  Service: Endoscopy;  Laterality: N/A;  . CORONARY ARTERY BYPASS GRAFT N/A 08/03/2014   Procedure: CORONARY ARTERY BYPASS GRAFTING (CABG);  Surgeon: [name], MD;  Location: [facility]  OR;  Service: Open Heart Surgery;  Laterality: N/A;  . EP IMPLANTABLE DEVICE N/A 05/01/2015   MDT ICD implanted for primary prevention of sudden death  . ESOPHAGOGASTRODUODENOSCOPY (EGD) WITH PROPOFOL N/A 04/16/2015   Procedure: ESOPHAGOGASTRODUODENOSCOPY (EGD) WITH PROPOFOL;  Surgeon: [name], MD;  Location: [facility] ENDOSCOPY;  Service: Endoscopy;  Laterality: N/A;  . ESOPHAGOGASTRODUODENOSCOPY (EGD) WITH PROPOFOL N/A 08/24/2017   Procedure:  ESOPHAGOGASTRODUODENOSCOPY (EGD) WITH PROPOFOL;  Surgeon: [name], MD;  Location: [facility] ENDOSCOPY;  Service: Endoscopy;  Laterality: N/A;  . FRACTURE SURGERY    . HERNIA REPAIR    . HIP PINNING,CANNULATED Left 07/22/2018   Procedure: CANNULATED HIP PINNING;  Surgeon: [name], MD;  Location: [place];  Service: Orthopedics;  Laterality: Left;  . MITRAL VALVE REPAIR N/A 12/30/2018   Procedure: MITRAL VALVE REPAIR;  Surgeon: [name], MD;  Location: [place] [facility];  Service: Cardiovascular;  Laterality: N/A;  . RIGHT/LEFT HEART CATH AND CORONARY/GRAFT ANGIOGRAPHY N/A 12/21/2018   Procedure: RIGHT/LEFT HEART CATH AND CORONARY/GRAFT ANGIOGRAPHY;  Surgeon: [name], MD;  Location: [place] [facility];  Service: Cardiovascular;  Laterality: N/A;  . TEE WITHOUT CARDIOVERSION N/A 08/03/2014   Procedure: TRANSESOPHAGEAL ECHOCARDIOGRAM (TEE);  Surgeon: [name], MD;  Location: [place];  Service: Open Heart Surgery;  Laterality: N/A;  . TEE WITHOUT CARDIOVERSION N/A 02/10/2018   Procedure: TRANSESOPHAGEAL ECHOCARDIOGRAM (TEE);  Surgeon: [name], MD;  Location: [facility] ENDOSCOPY;  Service: Cardiovascular;  Laterality: N/A;  . TEE WITHOUT CARDIOVERSION N/A 11/09/2019   Procedure: TRANSESOPHAGEAL ECHOCARDIOGRAM (TEE);  Surgeon: [name], MD;  Location: [facility] ENDOSCOPY;  Service: Cardiovascular;  Laterality: N/A;    Current Outpatient Medications  Medication Sig Dispense Refill  . acetaminophen (TYLENOL) 325 MG tablet Take 325 mg by mouth every 6 (six) hours as needed for mild pain or moderate pain.     Marland Kitchen allopurinol (ZYLOPRIM) 100 MG tablet Take 100 mg by mouth daily.    Marland Kitchen amiodarone (PACERONE) 200 MG tablet Take 1 tablet (200 mg total) by mouth daily. 90 tablet 1  . apixaban (ELIQUIS) 5 MG TABS tablet Take 1 tablet (5 mg total) by mouth 2 (two) times daily. 60 tablet 5  . Ascorbic Acid (VITAMIN C) 100 MG tablet Take 100 mg by mouth daily.    Marland Kitchen atorvastatin (LIPITOR) 40  MG tablet TAKE 1 TABLET (40 MG TOTAL) BY MOUTH DAILY AT 6 PM. (Patient taking differently: Take 40 mg by mouth at bedtime. ) 90 tablet 3  . carvedilol (COREG) 6.25 MG tablet Take 1 tablet (6.25 mg total) by mouth 2 (two) times daily. 180 tablet 3  . Cholecalciferol (VITAMIN D3 SUPER STRENGTH) 50 MCG (2000 UT) TABS Take 2,000 Units by mouth at bedtime.     . cyanocobalamin 500 MCG tablet Take 500 mcg by mouth at bedtime.     . digoxin (LANOXIN) 0.125 MG tablet Take 0.5 tablets (0.0625 mg total) by mouth every other day. 45 tablet 3  . docusate sodium (COLACE) 100 MG capsule Take 100 mg by mouth daily.    . DULoxetine (CYMBALTA) 30 MG capsule Take 30 mg by mouth daily.    Marland Kitchen eplerenone (INSPRA) 50 MG tablet TAKE 1 TABLET BY MOUTH EVERY DAY (Patient taking differently: Take 50 mg by mouth daily. ) 90 tablet 2  . ferrous sulfate 325 (65 FE) MG tablet Take 1 tablet (325 mg total) by mouth daily with breakfast. (Patient taking differently: Take 325 mg by mouth daily. ) 30  tablet 3  . gabapentin (NEURONTIN) 300 MG capsule Take 300 mg by mouth at bedtime.     Marland Kitchen ketoconazole (NIZORAL) 2 % shampoo Apply 1 application topically 2 (two) times a week.     Marland Kitchen KLOR-CON M20 20 MEQ tablet TAKE 1 TAB BY MOUTH TWICE A DAY. TAKE 1 EXTRA TABLET ON WEDNESDAY MORNING W/ METOLAZONE (Patient taking differently: Take 20 mEq by mouth See admin instructions. Taking 40 meq in morning and 20 meq in evening.   Extra 60 meq on Wednesday morning /with Metolazone.) 180 tablet 3  . metolazone (ZAROXOLYN) 2.5 MG tablet TAKE 1 TAB BY MOUTH ONCE A WEEK. WEDNESDAY MORNINGS. TAKE 1 EXTRA POTASSIUM TABLET WITH THIS (Patient taking differently: Take 2.5 mg by mouth every Wednesday. Morning) 5 tablet 11  . Multiple Vitamin (MULTIVITAMIN) tablet Take 1 tablet by mouth daily.    . pantoprazole (PROTONIX) 40 MG tablet Take 1 tablet (40 mg total) by mouth daily. 90 tablet 2  . torsemide (DEMADEX) 20 MG tablet Take 3 tablets (60 mg total) by mouth 2  (two) times daily. 540 tablet 0   No current facility-administered medications for this encounter.    Allergies  Allergen Reactions  . Spironolactone Other (See Comments)    gynecomastia     Social History   Socioeconomic History  . Marital status: Single    Spouse name: Not on file  . Number of children: Not on file  . Years of education: Not on file  . Highest education level: Not on file  Occupational History  . Not on file  Tobacco Use  . Smoking status: Former Smoker    Quit date: 2014    Years since quitting: 7.2  . Smokeless tobacco: Never Used  . Tobacco comment: 07/30/2017 Quit in 2014  Substance and Sexual Activity  . Alcohol use: No    Alcohol/week: 0.0 standard drinks  . Drug use: No  . Sexual activity: Never  Other Topics Concern  . Not on file  Social History Narrative   Pt lives in [place] with his mother.   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 Difficulty of Paying Living Expenses:   Food Insecurity:   . Worried About Charity fundraiser in the Last Year:   . Arboriculturist in the Last Year:   Transportation Needs:   . Film/video editor (Medical):   Marland Kitchen Lack of Transportation (Non-Medical):   Physical Activity:   . Days of Exercise per Week:   . Minutes of Exercise per Session:   Stress:   . Feeling of Stress :   Social Connections:   . Frequency of Communication with Friends and Family:   . Frequency of Social Gatherings with Friends and Family:   . Attends Religious Services:   . Active Member of Clubs or Organizations:   . Attends Archivist Meetings:   Marland Kitchen Marital Status:   Intimate Partner Violence:   . Fear of Current or Ex-Partner:   . Emotionally Abused:   Marland Kitchen Physically Abused:   . Sexually Abused:      ROS- All systems are reviewed and negative except as per the HPI above.  Physical Exam: Vitals:   11/30/19 1345  BP: 92/60  Pulse: 62   Weight: 73.6 kg  Height: 6' (1.829 m)    GEN- The patient is well appearing, alert and oriented x 3 today.   Head- normocephalic,  atraumatic Eyes-  Sclera clear, conjunctiva pink Ears- hearing intact Oropharynx- clear Neck- supple  Lungs- Clear to ausculation bilaterally, normal work of breathing Heart- irregular rate and rhythm, no murmurs, rubs or gallops  GI- soft, NT, ND, + BS Extremities- no clubbing, cyanosis, or edema MS- no significant deformity or atrophy Skin- no rash or lesion Psych- euthymic mood, full affect Neuro- strength and sensation are intact  Wt Readings from Last 3 Encounters:  11/30/19 73.6 kg  11/28/19 71.7 kg  11/22/19 72 kg    EKG today demonstrates afib HR 62, RBBB, QRS 168, QTc 475  Echo 02/02/19 demonstrated  1. The left ventricle has severely reduced systolic function, with an  ejection fraction of 20-25%. The cavity size was moderately dilated. Left  ventricular diastolic Doppler parameters are consistent with  pseudonormalization. Elevated left ventricular  end-diastolic pressure There is abnormal septal motion consistent with RV  pacemaker. Left ventricular diffuse hypokinesis.  2. There is akinesis of the inferoseptal, inferior and inferolateral  walls.  3. The right ventricle has severely reduced systolic function. The cavity  was normal. There is no increase in right ventricular wall thickness.  Right ventricular systolic pressure is mildly elevated with an estimated  pressure of 31.6 mmHg.  4. Left atrial size was mild-moderately dilated.  5. Right atrial size was moderately dilated.  6. Moderate thickening of the anterior mitral valve leaflet. There is  severe mitral annular calcification present. Mitral valve regurgitation is  mild to moderate by color flow Doppler. The MR jet is eccentric anteriorly  directed.  7. S/P mitraclip x1.  8. Tricuspid valve regurgitation is moderate.  9. The aortic valve is tricuspid. Mild  sclerosis of the aortic valve.  Aortic valve regurgitation is trivial by color flow Doppler.  10. The inferior vena cava was dilated in size with <50% respiratory  variability.  11. The interatrial septum appears to be lipomatous.   Epic records are reviewed at length today  CHA2DS2-VASc Score = 3 The patient's score is based upon: CHF History: Yes HTN History: No Age : [age] Diabetes History: No Stroke History: No Vascular Disease History: Yes Gender: Male      ASSESSMENT AND PLAN: 1. Persistent Atrial Fibrillation (ICD10:  I48.19) The patient's CHA2DS2-VASc score is 3, indicating a 3.2% annual risk of stroke.   S/p DCCV on 11/09/19 with ERAF. We discussed therapeutic options today. After discussing the risks and benefits, will arrange for DCCV now that he has loaded on amiodarone for a couple more weeks.  Continue amiodarone 200 mg daily Continue Eliquis 5 mg BID Continue Coreg 6.25 mg BID Continue digoxin 0.125 mg every other day Check bmet/CBC  2. Secondary Hypercoagulable State (ICD10:  D68.69) The patient is at significant risk for stroke/thromboembolism based upon his CHA2DS2-VASc Score of 3.  Continue Apixaban (Eliquis).   3. CAD S/p CABG. No anginal symptoms.  4. MR S/p Mitraclip Followed by Dr [name].  5. Chronic systolic CHF Ischemic CM. S/p ICD Patient denies any SOB, orthopnea, or edema today. Followed in [facility].   Follow up in the AF clinic one week post DCCV.    [facility] [address] [place], [place] [address] [phone] 11/30/2019 2:20 PM

## 2019-11-30 NOTE — Patient Instructions (Signed)
Cardioversion scheduled for Monday, April 19th  - Arrive at the [facility] and go to admitting at 11:30AM  -Do not eat or drink anything after midnight the night prior to your procedure.  - Take all your morning medication with a sip of water prior to arrival.  - You will not be able to drive home after your procedure.

## 2019-11-30 NOTE — Telephone Encounter (Signed)
This is a CHF pt 

## 2019-11-30 NOTE — Progress Notes (Signed)
Primary Care Physician: [name], MD Primary Cardiologist: Dr [name] Primary Electrophysiologist: [name] Referring Physician: Dr [name] is a 69 y.o. male with a history of CAD, chronic systolic CHF s/p ICD, MR s/p Mitraclip, CKD, persistent atrial fibrillation, cirrhosis, who presents for follow up in the [facility].  The patient was initially diagnosed with atrial fibrillation 09/2019 on heart monitoring. He was started on Eliquis for a CHADS2VASC score of 3 and amiodarone. Burden on Zio patch was 5%. On follow up with Dr [name], he had been in persistent afib for two weeks and underwent TEE/DCCV on 11/09/19. Unfortunately, the device clinic received an alert on 11/18/19 that he was back in afib. Patient denies awareness of his arrhythmia. He is a difficult historian with numerous somatic complaints. He remains in rate controlled afib today.  Today, he denies symptoms of palpitations, chest pain, shortness of breath, orthopnea, PND, lower extremity edema, dizziness, presyncope, syncope, snoring, daytime somnolence, bleeding, or neurologic sequela. The patient is tolerating medications without difficulties and is otherwise without complaint today.    Atrial Fibrillation Risk Factors:  he does not have symptoms or diagnosis of sleep apnea. .  he has a BMI of Body mass index is 22 kg/m.Marland Kitchen Filed Weights   11/30/19 1345  Weight: 73.6 kg    Family History  Problem Relation Age of Onset  . Valvular heart disease Mother        Ruptured valve  . CAD Father   . Heart Problems Brother        Stents x 4  . Diabetes Brother   . Prostate cancer Neg Hx   . Bladder Cancer Neg Hx   . Kidney cancer Neg Hx      Atrial Fibrillation Management history:  Previous antiarrhythmic drugs: amiodarone Previous cardioversions: 11/09/19 Previous ablations: none CHADS2VASC score: 3 Anticoagulation history: Eliquis   Past Medical History:   Diagnosis Date  . AICD (automatic cardioverter/defibrillator) present   . Anemia   . Anxiety   . Arthritis   . Brunner's gland hyperplasia of duodenum   . CHF (congestive heart failure) ([place])   . Coronary artery disease   . External hemorrhoids   . Fracture of femoral neck, left ([place]) 07/21/2018  . GERD (gastroesophageal reflux disease)   . Hearing loss   . HH (hiatus hernia)   . Internal hemorrhoids   . Ischemic cardiomyopathy   . Leg fracture, left   . Myocardial infarction ([place])   . Paronychia   . Pneumonia   . Psoriasis   . PUD (peptic ulcer disease)   . Schatzki's ring   . Tubular adenoma of colon   . Vitiligo    Past Surgical History:  Procedure Laterality Date  . ATRIAL SEPTAL DEFECT(ASD) CLOSURE N/A 12/30/2018   Procedure: ATRIAL SEPTAL DEFECT(ASD) CLOSURE;  Surgeon: [name], MD;  Location: [place] [facility];  Service: Cardiovascular;  Laterality: N/A;  . CARDIAC SURGERY    . CARDIOVERSION N/A 11/09/2019   Procedure: CARDIOVERSION;  Surgeon: [name], MD;  Location: [facility] ENDOSCOPY;  Service: Cardiovascular;  Laterality: N/A;  . COLONOSCOPY WITH ESOPHAGOGASTRODUODENOSCOPY (EGD)    . COLONOSCOPY WITH PROPOFOL N/A 08/24/2017   Procedure: COLONOSCOPY WITH PROPOFOL;  Surgeon: [name], MD;  Location: [facility] ENDOSCOPY;  Service: Endoscopy;  Laterality: N/A;  . CORONARY ARTERY BYPASS GRAFT N/A 08/03/2014   Procedure: CORONARY ARTERY BYPASS GRAFTING (CABG);  Surgeon: [name], MD;  Location: [facility]  OR;  Service: Open Heart Surgery;  Laterality: N/A;  . EP IMPLANTABLE DEVICE N/A 05/01/2015   MDT ICD implanted for primary prevention of sudden death  . ESOPHAGOGASTRODUODENOSCOPY (EGD) WITH PROPOFOL N/A 04/16/2015   Procedure: ESOPHAGOGASTRODUODENOSCOPY (EGD) WITH PROPOFOL;  Surgeon: [name], MD;  Location: [facility] ENDOSCOPY;  Service: Endoscopy;  Laterality: N/A;  . ESOPHAGOGASTRODUODENOSCOPY (EGD) WITH PROPOFOL N/A 08/24/2017   Procedure:  ESOPHAGOGASTRODUODENOSCOPY (EGD) WITH PROPOFOL;  Surgeon: [name], MD;  Location: [facility] ENDOSCOPY;  Service: Endoscopy;  Laterality: N/A;  . FRACTURE SURGERY    . HERNIA REPAIR    . HIP PINNING,CANNULATED Left 07/22/2018   Procedure: CANNULATED HIP PINNING;  Surgeon: [name], MD;  Location: [place];  Service: Orthopedics;  Laterality: Left;  . MITRAL VALVE REPAIR N/A 12/30/2018   Procedure: MITRAL VALVE REPAIR;  Surgeon: [name], MD;  Location: [place] [facility];  Service: Cardiovascular;  Laterality: N/A;  . RIGHT/LEFT HEART CATH AND CORONARY/GRAFT ANGIOGRAPHY N/A 12/21/2018   Procedure: RIGHT/LEFT HEART CATH AND CORONARY/GRAFT ANGIOGRAPHY;  Surgeon: [name], MD;  Location: [place] [facility];  Service: Cardiovascular;  Laterality: N/A;  . TEE WITHOUT CARDIOVERSION N/A 08/03/2014   Procedure: TRANSESOPHAGEAL ECHOCARDIOGRAM (TEE);  Surgeon: [name], MD;  Location: [place];  Service: Open Heart Surgery;  Laterality: N/A;  . TEE WITHOUT CARDIOVERSION N/A 02/10/2018   Procedure: TRANSESOPHAGEAL ECHOCARDIOGRAM (TEE);  Surgeon: [name], MD;  Location: [facility] ENDOSCOPY;  Service: Cardiovascular;  Laterality: N/A;  . TEE WITHOUT CARDIOVERSION N/A 11/09/2019   Procedure: TRANSESOPHAGEAL ECHOCARDIOGRAM (TEE);  Surgeon: [name], MD;  Location: [facility] ENDOSCOPY;  Service: Cardiovascular;  Laterality: N/A;    Current Outpatient Medications  Medication Sig Dispense Refill  . acetaminophen (TYLENOL) 325 MG tablet Take 325 mg by mouth every 6 (six) hours as needed for mild pain or moderate pain.     Marland Kitchen allopurinol (ZYLOPRIM) 100 MG tablet Take 100 mg by mouth daily.    Marland Kitchen amiodarone (PACERONE) 200 MG tablet Take 1 tablet (200 mg total) by mouth daily. 90 tablet 1  . apixaban (ELIQUIS) 5 MG TABS tablet Take 1 tablet (5 mg total) by mouth 2 (two) times daily. 60 tablet 5  . Ascorbic Acid (VITAMIN C) 100 MG tablet Take 100 mg by mouth daily.    Marland Kitchen atorvastatin (LIPITOR) 40  MG tablet TAKE 1 TABLET (40 MG TOTAL) BY MOUTH DAILY AT 6 PM. (Patient taking differently: Take 40 mg by mouth at bedtime. ) 90 tablet 3  . carvedilol (COREG) 6.25 MG tablet Take 1 tablet (6.25 mg total) by mouth 2 (two) times daily. 180 tablet 3  . Cholecalciferol (VITAMIN D3 SUPER STRENGTH) 50 MCG (2000 UT) TABS Take 2,000 Units by mouth at bedtime.     . cyanocobalamin 500 MCG tablet Take 500 mcg by mouth at bedtime.     . digoxin (LANOXIN) 0.125 MG tablet Take 0.5 tablets (0.0625 mg total) by mouth every other day. 45 tablet 3  . docusate sodium (COLACE) 100 MG capsule Take 100 mg by mouth daily.    . DULoxetine (CYMBALTA) 30 MG capsule Take 30 mg by mouth daily.    Marland Kitchen eplerenone (INSPRA) 50 MG tablet TAKE 1 TABLET BY MOUTH EVERY DAY (Patient taking differently: Take 50 mg by mouth daily. ) 90 tablet 2  . ferrous sulfate 325 (65 FE) MG tablet Take 1 tablet (325 mg total) by mouth daily with breakfast. (Patient taking differently: Take 325 mg by mouth daily. ) 30  tablet 3  . gabapentin (NEURONTIN) 300 MG capsule Take 300 mg by mouth at bedtime.     Marland Kitchen ketoconazole (NIZORAL) 2 % shampoo Apply 1 application topically 2 (two) times a week.     Marland Kitchen KLOR-CON M20 20 MEQ tablet TAKE 1 TAB BY MOUTH TWICE A DAY. TAKE 1 EXTRA TABLET ON WEDNESDAY MORNING W/ METOLAZONE (Patient taking differently: Take 20 mEq by mouth See admin instructions. Taking 40 meq in morning and 20 meq in evening.   Extra 60 meq on Wednesday morning /with Metolazone.) 180 tablet 3  . metolazone (ZAROXOLYN) 2.5 MG tablet TAKE 1 TAB BY MOUTH ONCE A WEEK. WEDNESDAY MORNINGS. TAKE 1 EXTRA POTASSIUM TABLET WITH THIS (Patient taking differently: Take 2.5 mg by mouth every Wednesday. Morning) 5 tablet 11  . Multiple Vitamin (MULTIVITAMIN) tablet Take 1 tablet by mouth daily.    . pantoprazole (PROTONIX) 40 MG tablet Take 1 tablet (40 mg total) by mouth daily. 90 tablet 2  . torsemide (DEMADEX) 20 MG tablet Take 3 tablets (60 mg total) by mouth 2  (two) times daily. 540 tablet 0   No current facility-administered medications for this encounter.    Allergies  Allergen Reactions  . Spironolactone Other (See Comments)    gynecomastia     Social History   Socioeconomic History  . Marital status: Single    Spouse name: Not on file  . Number of children: Not on file  . Years of education: Not on file  . Highest education level: Not on file  Occupational History  . Not on file  Tobacco Use  . Smoking status: Former Smoker    Quit date: 2014    Years since quitting: 7.2  . Smokeless tobacco: Never Used  . Tobacco comment: 07/30/2017 Quit in 2014  Substance and Sexual Activity  . Alcohol use: No    Alcohol/week: 0.0 standard drinks  . Drug use: No  . Sexual activity: Never  Other Topics Concern  . Not on file  Social History Narrative   Pt lives in [place] with his mother.   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 Difficulty of Paying Living Expenses:   Food Insecurity:   . Worried About Charity fundraiser in the Last Year:   . Arboriculturist in the Last Year:   Transportation Needs:   . Film/video editor (Medical):   Marland Kitchen Lack of Transportation (Non-Medical):   Physical Activity:   . Days of Exercise per Week:   . Minutes of Exercise per Session:   Stress:   . Feeling of Stress :   Social Connections:   . Frequency of Communication with Friends and Family:   . Frequency of Social Gatherings with Friends and Family:   . Attends Religious Services:   . Active Member of Clubs or Organizations:   . Attends Archivist Meetings:   Marland Kitchen Marital Status:   Intimate Partner Violence:   . Fear of Current or Ex-Partner:   . Emotionally Abused:   Marland Kitchen Physically Abused:   . Sexually Abused:      ROS- All systems are reviewed and negative except as per the HPI above.  Physical Exam: Vitals:   11/30/19 1345  BP: 92/60  Pulse: 62   Weight: 73.6 kg  Height: 6' (1.829 m)    GEN- The patient is well appearing, alert and oriented x 3 today.   Head- normocephalic,  atraumatic Eyes-  Sclera clear, conjunctiva pink Ears- hearing intact Oropharynx- clear Neck- supple  Lungs- Clear to ausculation bilaterally, normal work of breathing Heart- irregular rate and rhythm, no murmurs, rubs or gallops  GI- soft, NT, ND, + BS Extremities- no clubbing, cyanosis, or edema MS- no significant deformity or atrophy Skin- no rash or lesion Psych- euthymic mood, full affect Neuro- strength and sensation are intact  Wt Readings from Last 3 Encounters:  11/30/19 73.6 kg  11/28/19 71.7 kg  11/22/19 72 kg    EKG today demonstrates afib HR 62, RBBB, QRS 168, QTc 475  Echo 02/02/19 demonstrated  1. The left ventricle has severely reduced systolic function, with an  ejection fraction of 20-25%. The cavity size was moderately dilated. Left  ventricular diastolic Doppler parameters are consistent with  pseudonormalization. Elevated left ventricular  end-diastolic pressure There is abnormal septal motion consistent with RV  pacemaker. Left ventricular diffuse hypokinesis.  2. There is akinesis of the inferoseptal, inferior and inferolateral  walls.  3. The right ventricle has severely reduced systolic function. The cavity  was normal. There is no increase in right ventricular wall thickness.  Right ventricular systolic pressure is mildly elevated with an estimated  pressure of 31.6 mmHg.  4. Left atrial size was mild-moderately dilated.  5. Right atrial size was moderately dilated.  6. Moderate thickening of the anterior mitral valve leaflet. There is  severe mitral annular calcification present. Mitral valve regurgitation is  mild to moderate by color flow Doppler. The MR jet is eccentric anteriorly  directed.  7. S/P mitraclip x1.  8. Tricuspid valve regurgitation is moderate.  9. The aortic valve is tricuspid. Mild  sclerosis of the aortic valve.  Aortic valve regurgitation is trivial by color flow Doppler.  10. The inferior vena cava was dilated in size with <50% respiratory  variability.  11. The interatrial septum appears to be lipomatous.   Epic records are reviewed at length today  CHA2DS2-VASc Score = 3 The patient's score is based upon: CHF History: Yes HTN History: No Age : 102-74 Diabetes History: No Stroke History: No Vascular Disease History: Yes Gender: Male      ASSESSMENT AND PLAN: 1. Persistent Atrial Fibrillation (ICD10:  I48.19) The patient's CHA2DS2-VASc score is 3, indicating a 3.2% annual risk of stroke.   S/p DCCV on 11/09/19 with ERAF. We discussed therapeutic options today. After discussing the risks and benefits, will arrange for DCCV now that he has loaded on amiodarone for a couple more weeks.  Continue amiodarone 200 mg daily Continue Eliquis 5 mg BID Continue Coreg 6.25 mg BID Continue digoxin 0.125 mg every other day Check bmet/CBC  2. Secondary Hypercoagulable State (ICD10:  D68.69) The patient is at significant risk for stroke/thromboembolism based upon his CHA2DS2-VASc Score of 3.  Continue Apixaban (Eliquis).   3. CAD S/p CABG. No anginal symptoms.  4. MR S/p Mitraclip Followed by Dr [name].  5. Chronic systolic CHF Ischemic CM. S/p ICD Patient denies any SOB, orthopnea, or edema today. Followed in [facility].   Follow up in the AF clinic one week post DCCV.    [facility] [address] [place], [place] [address] [phone] 11/30/2019 2:20 PM

## 2019-12-07 ENCOUNTER — Telehealth (INDEPENDENT_AMBULATORY_CARE_PROVIDER_SITE_OTHER): Payer: Medicare Other | Admitting: Internal Medicine

## 2019-12-07 ENCOUNTER — Other Ambulatory Visit: Payer: Self-pay | Admitting: Internal Medicine

## 2019-12-07 ENCOUNTER — Other Ambulatory Visit: Payer: Self-pay

## 2019-12-07 ENCOUNTER — Ambulatory Visit
Admission: RE | Admit: 2019-12-07 | Discharge: 2019-12-07 | Disposition: A | Discharge status: Home / Self Care | Payer: Medicare Other | Source: Ambulatory Visit | Attending: Internal Medicine | Admitting: Internal Medicine

## 2019-12-07 ENCOUNTER — Encounter: Payer: Self-pay | Admitting: Internal Medicine

## 2019-12-07 VITALS — Ht 72.0 in | Wt 162.0 lb

## 2019-12-07 DIAGNOSIS — M7989 Other specified soft tissue disorders: Secondary | ICD-10-CM | POA: Diagnosis not present

## 2019-12-07 DIAGNOSIS — I4819 Other persistent atrial fibrillation: Secondary | ICD-10-CM

## 2019-12-07 NOTE — Progress Notes (Signed)
No show for virtual visit  Message left with his brother... will reschedule.

## 2019-12-09 ENCOUNTER — Other Ambulatory Visit: Admission: RE | Admit: 2019-12-09 | Payer: Medicare Other | Source: Ambulatory Visit

## 2019-12-12 ENCOUNTER — Ambulatory Visit (INDEPENDENT_AMBULATORY_CARE_PROVIDER_SITE_OTHER): Payer: Medicare Other

## 2019-12-12 ENCOUNTER — Ambulatory Visit (HOSPITAL_COMMUNITY): Admission: RE | Admit: 2019-12-12 | Payer: Medicare Other | Source: Home / Self Care | Admitting: Cardiology

## 2019-12-12 ENCOUNTER — Encounter (HOSPITAL_COMMUNITY): Payer: Self-pay | Admitting: Certified Registered"

## 2019-12-12 ENCOUNTER — Telehealth (HOSPITAL_COMMUNITY): Payer: Self-pay

## 2019-12-12 ENCOUNTER — Encounter (HOSPITAL_COMMUNITY): Admission: RE | Payer: Self-pay | Source: Home / Self Care

## 2019-12-12 DIAGNOSIS — I5022 Chronic systolic (congestive) heart failure: Secondary | ICD-10-CM

## 2019-12-12 DIAGNOSIS — Z95810 Presence of automatic (implantable) cardiac defibrillator: Secondary | ICD-10-CM

## 2019-12-12 SURGERY — CARDIOVERSION
Anesthesia: General

## 2019-12-12 NOTE — Telephone Encounter (Signed)
Carl [name] contacted me and advised his r leg is hurting and asking what he could do about it.  They are treating him for cellulitis.  Talking with Peer asked if he was going to his cardioversion today and he advised his sister said he did not have anything today.  Contacted his brother [name] and he was confused to and advised he did not know it was today.  He did not go get covid tested last week wither.  Contacted AFIB clinic and advised them of confusion about about appt today and they said they will reschedule it and let [name] know.  I advised them that they had a death in the family and maybe that is what got them off track but they are usually good about keeping appts.  Contacted [name] and advised him.    [place] [phone]

## 2019-12-13 ENCOUNTER — Encounter (HOSPITAL_COMMUNITY): Payer: Self-pay

## 2019-12-13 NOTE — Progress Notes (Signed)
EPIC Encounter for ICM Monitoring  Patient Name: Carl [name] is a 69 y.o. male Date: 12/13/2019 Primary Care Physican: [name], MD Primary Cardiologist:[place] Electrophysiologist: [place] 11/22/2019 OfficeWeight: 158 lbs  Clinical Status  Since 28-Nov-2019   AF                          214 Episodes  Time in AF  6.8 hr/day (28.4%)   4 days in AF Since Last Session  Transmission reviewed.2 ED visits in March. Message sent to device clinic for FYI alerts must be reset AF Daily Burden before they can be triggered again.  OptivolThoracic impedancetrending close to baseline normal.   Prescribed:  Torsemide20mg take3tablets(60mg  total)by mouth twice a day.  Metolazone 2.5 mg take 1 tablet by mouth every Wednesday morning with 1 extra Potassium.   Klor Con 20 mEq Taking 40 meq in morning and 20 meq in evening.  Extra 60 meq on Wednesday morning /with Metolazone.  Labs: 11/30/2019 Creatinine 1.87, BUN 38, Potassium 4.6, Sodium 131, GFR 36-42 11/22/2019 Creatinine 2.52, BUN 61, Potassium 3.9, Sodium 127, GFR 25-29 11/09/2019 Creatinine 2.00, BUN 43, Potassium 3.8, Sodium 131 11/01/2019 Creatinine 1.86, BUN 28, Potassium 4.3, Sodium 135, GFR 36-42 10/14/2019 Creatinine 1.97, BUN 23, Potassium 3.3, Sodium 136, GFR 34-39(potassium increased 2/22) 10/06/2019 Creatinine 2.71, BUN 28, Potassium 3.7, Sodium 133, GFR 23-27 09/28/2019 Creatinine1.94, BUN39, Potassium4.4, Sodium132, GFR35-40 09/20/2019 Creatinine1.43, BUN19, Potassium3.5, Sodium135, GFR50-58 A complete set of results can be found in Results Review  Recommendations:None  Follow-up plan: ICM clinic phone appointment on 01/16/2020. 91 day device clinic remote transmission6/10/2019.Office visit with HF clinic PA/NP on 01/12/2020 and office visit with Tommye Standard, PA on 12/16/2019  Copy of ICM check sent to Dr.Allred.  3 month ICM trend:  12/12/2019    AT/AF   1 Year ICM trend:       Rosalene Billings, RN 12/13/2019 8:20 AM

## 2019-12-14 ENCOUNTER — Telehealth (HOSPITAL_COMMUNITY): Payer: Self-pay

## 2019-12-14 NOTE — Telephone Encounter (Signed)
Contacted Carl [name] to make sure he new of [place] covid test on Monday and cardioversion on Wednesday.  He states he is aware and will continue to remind them.    [place] [phone]

## 2019-12-16 ENCOUNTER — Encounter: Payer: Medicare Other | Admitting: Physician Assistant

## 2019-12-19 ENCOUNTER — Other Ambulatory Visit: Payer: Self-pay

## 2019-12-19 ENCOUNTER — Other Ambulatory Visit
Admission: RE | Admit: 2019-12-19 | Discharge: 2019-12-19 | Disposition: A | Discharge status: Home / Self Care | Payer: Medicare Other | Source: Ambulatory Visit | Attending: Cardiology | Admitting: Cardiology

## 2019-12-19 ENCOUNTER — Ambulatory Visit (HOSPITAL_COMMUNITY): Payer: Medicare Other | Admitting: Physician Assistant

## 2019-12-19 ENCOUNTER — Telehealth (HOSPITAL_COMMUNITY): Payer: Self-pay

## 2019-12-19 DIAGNOSIS — I5022 Chronic systolic (congestive) heart failure: Secondary | ICD-10-CM | POA: Insufficient documentation

## 2019-12-19 DIAGNOSIS — Z20822 Contact with and (suspected) exposure to covid-19: Secondary | ICD-10-CM | POA: Diagnosis not present

## 2019-12-19 DIAGNOSIS — Z01812 Encounter for preprocedural laboratory examination: Secondary | ICD-10-CM | POA: Insufficient documentation

## 2019-12-19 LAB — BASIC METABOLIC PANEL
Anion gap: 10 (ref 5–15)
BUN: 35 mg/dL — ABNORMAL HIGH (ref 8–23)
CO2: 30 mmol/L (ref 22–32)
Calcium: 9 mg/dL (ref 8.9–10.3)
Chloride: 88 mmol/L — ABNORMAL LOW (ref 98–111)
Creatinine, Ser: 2.04 mg/dL — ABNORMAL HIGH (ref 0.61–1.24)
GFR calc Af Amer: 38 mL/min — ABNORMAL LOW (ref >60)
GFR calc non Af Amer: 33 mL/min — ABNORMAL LOW (ref >60)
Glucose, Bld: 104 mg/dL — ABNORMAL HIGH (ref 70–99)
Potassium: 4.2 mmol/L (ref 3.5–5.1)
Sodium: 128 mmol/L — ABNORMAL LOW (ref 135–145)

## 2019-12-19 NOTE — Telephone Encounter (Signed)
Contacted Allard's brother [name] and reminded home of preadmission blood work today.  He states he is aware and his sister is taking Arden to appt today.  He is also aware of cardioversion on Wednesday.   [place] [phone]

## 2019-12-20 ENCOUNTER — Other Ambulatory Visit (HOSPITAL_COMMUNITY): Payer: Self-pay

## 2019-12-20 ENCOUNTER — Encounter (HOSPITAL_COMMUNITY): Payer: Self-pay

## 2019-12-20 LAB — SARS CORONAVIRUS 2 (TAT 6-24 HRS): SARS Coronavirus 2: NEGATIVE

## 2019-12-20 NOTE — Progress Notes (Signed)
Janalyn Harder, patient's brother. He is bringing Chartered certified accountant and confirmed he has been quarantined and has no symptoms. Will remind NPO for am and to take Eliquis.  Will bring a list of meds and when he takes each.

## 2019-12-20 NOTE — Progress Notes (Signed)
Today had a home visit with [name].  Verified his medications and placed meds in his boxes.  It appears he is taking his medications appropriately now.  Called in duloxetine, potassium and eliquis to his pharmacy.  [name] is aware to pick these medications up from pharmacy prior to my next visit.  [place] complaint today is legs hurt.  He has no swelling in his legs and no redness.  He states they just hurt all the time.  He is aware of cardioversion tomorrow and [name] will be bringing him.  They are both aware of the time.  Explained to Chrystian today is not allow to go out in the public, his sister was calling to get him to go off with her.  He likes going places.  Appetite is good, he does not know how to watch high sodium foods and watch quantity, have talked with his sister and brother.  His sister takes him shopping and lets him buy what ever he wants and will take him out to places to eat that are high in sodium.  He states he has cut back on pepsi, even though he was drinking one when I showed up today.  He states he is drinking more water than he was.  He has not been weighing like he should, he states the tablet that came with it does not work.  Explained to him he can still weigh and write it down like he was before he got the tablet.  He states he will start doing that again.  Will continue to visit for heart failure, medication management and diet.   [place] [phone]

## 2019-12-21 ENCOUNTER — Ambulatory Visit (HOSPITAL_COMMUNITY): Payer: Medicare Other | Admitting: Anesthesiology

## 2019-12-21 ENCOUNTER — Other Ambulatory Visit: Payer: Self-pay

## 2019-12-21 ENCOUNTER — Ambulatory Visit (HOSPITAL_COMMUNITY)
Admission: RE | Admit: 2019-12-21 | Discharge: 2019-12-21 | Disposition: A | Discharge status: Home / Self Care | Payer: Medicare Other | Attending: Cardiology | Admitting: Cardiology

## 2019-12-21 ENCOUNTER — Ambulatory Visit (HOSPITAL_COMMUNITY)
Admission: RE | Admit: 2019-12-21 | Discharge: 2019-12-21 | Disposition: A | Discharge status: Home / Self Care | Payer: Medicare Other | Source: Ambulatory Visit | Attending: Physician Assistant | Admitting: Physician Assistant

## 2019-12-21 ENCOUNTER — Encounter (HOSPITAL_COMMUNITY): Payer: Self-pay | Admitting: Cardiology

## 2019-12-21 ENCOUNTER — Encounter (HOSPITAL_COMMUNITY)
Admission: RE | Disposition: A | Discharge status: Home / Self Care | Payer: Self-pay | Source: Home / Self Care | Attending: Cardiology

## 2019-12-21 DIAGNOSIS — N189 Chronic kidney disease, unspecified: Secondary | ICD-10-CM | POA: Insufficient documentation

## 2019-12-21 DIAGNOSIS — D6869 Other thrombophilia: Secondary | ICD-10-CM | POA: Diagnosis not present

## 2019-12-21 DIAGNOSIS — I340 Nonrheumatic mitral (valve) insufficiency: Secondary | ICD-10-CM | POA: Insufficient documentation

## 2019-12-21 DIAGNOSIS — F419 Anxiety disorder, unspecified: Secondary | ICD-10-CM | POA: Diagnosis not present

## 2019-12-21 DIAGNOSIS — Z79899 Other long term (current) drug therapy: Secondary | ICD-10-CM | POA: Diagnosis not present

## 2019-12-21 DIAGNOSIS — K219 Gastro-esophageal reflux disease without esophagitis: Secondary | ICD-10-CM | POA: Diagnosis not present

## 2019-12-21 DIAGNOSIS — I5022 Chronic systolic (congestive) heart failure: Secondary | ICD-10-CM | POA: Insufficient documentation

## 2019-12-21 DIAGNOSIS — I255 Ischemic cardiomyopathy: Secondary | ICD-10-CM | POA: Diagnosis not present

## 2019-12-21 DIAGNOSIS — Z87891 Personal history of nicotine dependence: Secondary | ICD-10-CM | POA: Diagnosis not present

## 2019-12-21 DIAGNOSIS — Z951 Presence of aortocoronary bypass graft: Secondary | ICD-10-CM | POA: Insufficient documentation

## 2019-12-21 DIAGNOSIS — Z95810 Presence of automatic (implantable) cardiac defibrillator: Secondary | ICD-10-CM | POA: Insufficient documentation

## 2019-12-21 DIAGNOSIS — I4891 Unspecified atrial fibrillation: Secondary | ICD-10-CM

## 2019-12-21 DIAGNOSIS — I2510 Atherosclerotic heart disease of native coronary artery without angina pectoris: Secondary | ICD-10-CM | POA: Insufficient documentation

## 2019-12-21 DIAGNOSIS — I4819 Other persistent atrial fibrillation: Secondary | ICD-10-CM | POA: Insufficient documentation

## 2019-12-21 DIAGNOSIS — Z7901 Long term (current) use of anticoagulants: Secondary | ICD-10-CM | POA: Insufficient documentation

## 2019-12-21 DIAGNOSIS — I252 Old myocardial infarction: Secondary | ICD-10-CM | POA: Diagnosis not present

## 2019-12-21 HISTORY — PX: CARDIOVERSION: SHX1299

## 2019-12-21 LAB — CBC
HCT: 36.7 % — ABNORMAL LOW (ref 39.0–52.0)
Hemoglobin: 11.7 g/dL — ABNORMAL LOW (ref 13.0–17.0)
MCH: 32.6 pg (ref 26.0–34.0)
MCHC: 31.9 g/dL (ref 30.0–36.0)
MCV: 102.2 fL — ABNORMAL HIGH (ref 80.0–100.0)
Platelets: 310 10*3/uL (ref 150–400)
RBC: 3.59 MIL/uL — ABNORMAL LOW (ref 4.22–5.81)
RDW: 15 % (ref 11.5–15.5)
WBC: 7.3 10*3/uL (ref 4.0–10.5)
nRBC: 0 % (ref 0.0–0.2)

## 2019-12-21 LAB — BASIC METABOLIC PANEL
Anion gap: 12 (ref 5–15)
BUN: 31 mg/dL — ABNORMAL HIGH (ref 8–23)
CO2: 29 mmol/L (ref 22–32)
Calcium: 9.5 mg/dL (ref 8.9–10.3)
Chloride: 90 mmol/L — ABNORMAL LOW (ref 98–111)
Creatinine, Ser: 2.04 mg/dL — ABNORMAL HIGH (ref 0.61–1.24)
GFR calc Af Amer: 38 mL/min — ABNORMAL LOW (ref >60)
GFR calc non Af Amer: 33 mL/min — ABNORMAL LOW (ref >60)
Glucose, Bld: 124 mg/dL — ABNORMAL HIGH (ref 70–99)
Potassium: 4.6 mmol/L (ref 3.5–5.1)
Sodium: 131 mmol/L — ABNORMAL LOW (ref 135–145)

## 2019-12-21 SURGERY — CARDIOVERSION
Anesthesia: General

## 2019-12-21 MED ORDER — PROPOFOL 10 MG/ML IV BOLUS
INTRAVENOUS | Status: DC | PRN
  Administered 2019-12-21: 6 mg via INTRAVENOUS

## 2019-12-21 MED ORDER — LIDOCAINE 2% (20 MG/ML) 5 ML SYRINGE
INTRAMUSCULAR | Status: DC | PRN
  Administered 2019-12-21: 20 mg via INTRAVENOUS

## 2019-12-21 MED ORDER — SODIUM CHLORIDE 0.9 % IV SOLN: INTRAVENOUS | Status: DC | PRN

## 2019-12-21 NOTE — Procedures (Signed)
Electrical Cardioversion Procedure Note SHADRACK CARABELLO [id number] 1951/02/28  Procedure: Electrical Cardioversion Indications:  Atrial Fibrillation  Procedure Details Consent: Risks of procedure as well as the alternatives and risks of each were explained to the (patient/caregiver).  Consent for procedure obtained. Time Out: Verified patient identification, verified procedure, site/side was marked, verified correct patient position, special equipment/implants available, medications/allergies/relevent history reviewed, required imaging and test results available.  Performed  Patient placed on cardiac monitor, pulse oximetry, supplemental oxygen as necessary.  Sedation given: Propofol per anesthesiology Pacer pads placed anterior and posterior chest.  Cardioverted 1 time(s).  Cardioverted at [place].  Evaluation Findings: Post procedure EKG shows: NSR Complications: None Patient did tolerate procedure well.   [name] 12/21/2019, 12:28 PM

## 2019-12-21 NOTE — Interval H&P Note (Signed)
History and Physical Interval Note:  12/21/2019 12:06 PM  Carl [name]  has presented today for surgery, with the diagnosis of AFIB.  The various methods of treatment have been discussed with the patient and family. After consideration of risks, benefits and other options for treatment, the patient has consented to  Procedure(s): CARDIOVERSION (N/A) as a surgical intervention.  The patient's history has been reviewed, patient examined, no change in status, stable for surgery.  I have reviewed the patient's chart and labs.  Questions were answered to the patient's satisfaction.     Peace Noyes [employer]

## 2019-12-21 NOTE — Transfer of Care (Signed)
Immediate Anesthesia Transfer of Care Note  Patient: Carl [name]  Procedure(s) Performed: CARDIOVERSION (N/A )  Patient Location: Endoscopy Unit  Anesthesia Type:General  Level of Consciousness: drowsy, patient cooperative and responds to stimulation  Airway & Oxygen Therapy: Patient Spontanous Breathing and Patient connected to nasal cannula oxygen  Post-op Assessment: Report given to RN and Post -op Vital signs reviewed and stable  Post vital signs: Reviewed and stable  Last Vitals:  Vitals Value Taken Time  BP    Temp    Pulse    Resp    SpO2      Last Pain:  Vitals:   12/21/19 1053  TempSrc: Oral  PainSc: 0-No pain         Complications: No apparent anesthesia complications

## 2019-12-21 NOTE — Anesthesia Preprocedure Evaluation (Signed)
Anesthesia Evaluation  Patient identified by MRN, date of birth, ID band Patient awake    Reviewed: Allergy & Precautions, NPO status , Patient's Chart, lab work & pertinent test results  History of Anesthesia Complications Negative for: history of anesthetic complications  Airway Mallampati: II  TM Distance: >3 FB Neck ROM: Full    Dental  (+) Edentulous Upper, Edentulous Lower   Pulmonary former smoker,    Pulmonary exam normal        Cardiovascular hypertension, Pt. on home beta blockers and Pt. on medications + CAD, + CABG, + Peripheral Vascular Disease and +CHF  Normal cardiovascular exam+ Cardiac Defibrillator + Valvular Problems/Murmurs (s/p MVR)    '20 TTE - EF 20-25%. LV was moderately dilated. Left  ventricular diastolic Doppler parameters are consistent with pseudonormalization. Elevated left ventricular  end-diastolic pressur.e There is abnormal septal motion consistent with RV pacemaker. Left ventricular diffuse hypokinesis. There is akinesis of the inferoseptal, inferior and inferolateral walls. The right ventricle has severely reduced systolic function. Right ventricular systolic pressure is mildly elevated with an estimated  pressure of 31.6 mmHg. Left atrial size was mild-moderately dilated. Right atrial size was moderately dilated. Mild-mod MR. The MR jet is eccentric anteriorly  directed. S/P mitraclip x1. TR is moderate. Mild sclerosis of the aortic valve. Trivial AI.     Neuro/Psych PSYCHIATRIC DISORDERS Anxiety Depression negative neurological ROS     GI/Hepatic Neg liver ROS, hiatal hernia, PUD, GERD  Medicated and Controlled,  Endo/Other  negative endocrine ROS  Renal/GU CRFRenal disease     Musculoskeletal  (+) Arthritis ,   Abdominal   Peds  Hematology  (+) anemia ,  On eliquis    Anesthesia Other Findings Vitiligo Covid neg 11/07/19   Reproductive/Obstetrics                              Anesthesia Physical  Anesthesia Plan  ASA: III  Anesthesia Plan: General   Post-op Pain Management:    Induction: Intravenous  PONV Risk Score and Plan: 2 and Treatment may vary due to age or medical condition  Airway Management Planned: Natural Airway and Mask  Additional Equipment: None  Intra-op Plan:   Post-operative Plan:   Informed Consent: I have reviewed the patients History and Physical, chart, labs and discussed the procedure including the risks, benefits and alternatives for the proposed anesthesia with the patient or authorized representative who has indicated his/her understanding and acceptance.       Plan Discussed with: CRNA  Anesthesia Plan Comments: ( )        Anesthesia Quick Evaluation

## 2019-12-21 NOTE — Anesthesia Postprocedure Evaluation (Signed)
Anesthesia Post Note  Patient: Carl [name]  Procedure(s) Performed: CARDIOVERSION (N/A )     Patient location during evaluation: PACU Anesthesia Type: General Level of consciousness: awake and alert Pain management: pain level controlled Vital Signs Assessment: post-procedure vital signs reviewed and stable Respiratory status: spontaneous breathing, nonlabored ventilation, respiratory function stable and patient connected to nasal cannula oxygen Cardiovascular status: blood pressure returned to baseline and stable Postop Assessment: no apparent nausea or vomiting Anesthetic complications: no    Last Vitals:  Vitals:   12/21/19 1250 12/21/19 1251  BP: 104/71 104/71  Pulse: (!) 55 (!) 57  Resp: 20 (!) 21  Temp:    SpO2: 100% 100%    Last Pain:  Vitals:   12/21/19 1251  TempSrc:   PainSc: 0-No pain                 Tiajuana Amass

## 2019-12-22 NOTE — Discharge Instructions (Signed)
[facility] ENDOSCOPY [address] [place], [place]  [address] Phone:  [phone]   Patient: Carl [name]  Date of Birth: [birth year]  Date of Visit: 12/13/2019   To Whom It May Concern:  Carl [name] was seen and treated on 12/21/2019 and  Caryl Asp provided transport and care for him.           Sincerely,   Elea Holtzclaw Isaac RN            Electrical Cardioversion Electrical cardioversion is the delivery of a jolt of electricity to restore a normal rhythm to the heart. A rhythm that is too fast or is not regular keeps the heart from pumping well. In this procedure, sticky patches or metal paddles are placed on the chest to deliver electricity to the heart from a device. This procedure may be done in an emergency if:  There is low or no blood pressure as a result of the heart rhythm.  Normal rhythm must be restored as fast as possible to protect the brain and heart from further damage.  It may save a life. This may also be a scheduled procedure for irregular or fast heart rhythms that are not immediately life-threatening. Tell a health care provider about:  Any allergies you have.  All medicines you are taking, including vitamins, herbs, eye drops, creams, and over-the-counter medicines.  Any problems you or family members have had with anesthetic medicines.  Any blood disorders you have.  Any surgeries you have had.  Any medical conditions you have.  Whether you are pregnant or may be pregnant. What are the risks? Generally, this is a safe procedure. However, problems may occur, including:  Allergic reactions to medicines.  A blood clot that breaks free and travels to other parts of your body.  The possible return of an abnormal heart rhythm within hours or days after the procedure.  Your heart stopping (cardiac arrest). This is rare. What happens before the procedure? Medicines  Your health care provider may have you start  taking: ? Blood-thinning medicines (anticoagulants) so your blood does not clot as easily. ? Medicines to help stabilize your heart rate and rhythm.  Ask your health care provider about: ? Changing or stopping your regular medicines. This is especially important if you are taking diabetes medicines or blood thinners. ? Taking medicines such as aspirin and ibuprofen. These medicines can thin your blood. Do not take these medicines unless your health care provider tells you to take them. ? Taking over-the-counter medicines, vitamins, herbs, and supplements. General instructions  Follow instructions from your health care provider about eating or drinking restrictions.  Plan to have someone take you home from the hospital or clinic.  If you will be going home right after the procedure, plan to have someone with you for 24 hours.  Ask your health care provider what steps will be taken to help prevent infection. These may include washing your skin with a germ-killing soap. What happens during the procedure?   An IV will be inserted into one of your veins.  Sticky patches (electrodes) or metal paddles may be placed on your chest.  You will be given a medicine to help you relax (sedative).  An electrical shock will be delivered. The procedure may vary among health care providers and hospitals. What can I expect after the procedure?  Your blood pressure, heart rate, breathing rate, and blood oxygen level will be monitored until you leave the hospital or  clinic.  Your heart rhythm will be watched to make sure it does not change.  You may have some redness on the skin where the shocks were given. Follow these instructions at home:  Do not drive for 24 hours if you were given a sedative during your procedure.  Take over-the-counter and prescription medicines only as told by your health care provider.  Ask your health care provider how to check your pulse. Check it often.  Rest for 48  hours after the procedure or as told by your health care provider.  Avoid or limit your caffeine use as told by your health care provider.  Keep all follow-up visits as told by your health care provider. This is important. Contact a health care provider if:  You feel like your heart is beating too quickly or your pulse is not regular.  You have a serious muscle cramp that does not go away. Get help right away if:  You have discomfort in your chest.  You are dizzy or you feel faint.  You have trouble breathing or you are short of breath.  Your speech is slurred.  You have trouble moving an arm or leg on one side of your body.  Your fingers or toes turn cold or blue. Summary  Electrical cardioversion is the delivery of a jolt of electricity to restore a normal rhythm to the heart.  This procedure may be done right away in an emergency or may be a scheduled procedure if the condition is not an emergency.  Generally, this is a safe procedure.  After the procedure, check your pulse often as told by your health care provider. This information is not intended to replace advice given to you by your health care provider. Make sure you discuss any questions you have with your health care provider. Document Revised: 03/14/2019 Document Reviewed: 03/14/2019 Elsevier Patient Education  [place].

## 2019-12-22 NOTE — Progress Notes (Signed)
Faxed work note to [employer] for [employer].

## 2020-01-02 ENCOUNTER — Other Ambulatory Visit: Payer: Self-pay | Admitting: Family Medicine

## 2020-01-02 ENCOUNTER — Other Ambulatory Visit: Payer: Self-pay

## 2020-01-02 ENCOUNTER — Ambulatory Visit
Admission: RE | Admit: 2020-01-02 | Discharge: 2020-01-02 | Disposition: A | Discharge status: Home / Self Care | Payer: Medicare Other | Source: Ambulatory Visit | Attending: Family Medicine | Admitting: Family Medicine

## 2020-01-02 DIAGNOSIS — M7989 Other specified soft tissue disorders: Secondary | ICD-10-CM | POA: Insufficient documentation

## 2020-01-02 DIAGNOSIS — R238 Other skin changes: Secondary | ICD-10-CM | POA: Insufficient documentation

## 2020-01-02 DIAGNOSIS — N63 Unspecified lump in unspecified breast: Secondary | ICD-10-CM

## 2020-01-02 DIAGNOSIS — N6320 Unspecified lump in the left breast, unspecified quadrant: Secondary | ICD-10-CM

## 2020-01-04 ENCOUNTER — Other Ambulatory Visit: Payer: Self-pay

## 2020-01-04 ENCOUNTER — Ambulatory Visit (INDEPENDENT_AMBULATORY_CARE_PROVIDER_SITE_OTHER): Payer: Medicare Other | Admitting: Physician Assistant

## 2020-01-04 VITALS — BP 110/60 | HR 59 | Ht 72.0 in | Wt 165.0 lb

## 2020-01-04 DIAGNOSIS — I5022 Chronic systolic (congestive) heart failure: Secondary | ICD-10-CM

## 2020-01-04 DIAGNOSIS — Z95810 Presence of automatic (implantable) cardiac defibrillator: Secondary | ICD-10-CM

## 2020-01-04 DIAGNOSIS — I2510 Atherosclerotic heart disease of native coronary artery without angina pectoris: Secondary | ICD-10-CM

## 2020-01-04 DIAGNOSIS — I480 Paroxysmal atrial fibrillation: Secondary | ICD-10-CM

## 2020-01-04 NOTE — Progress Notes (Signed)
Cardiology Office Note Date:  01/04/2020  Patient ID:  Carl [name], Carl [name] 22-Dec-1950, MRN [id number] PCP:  [name], MD  Cardiologist:  Dr. [name] EP:  Dr. Rayann Heman    Chief Complaint: f/u post DCCV   History of Present Illness: Carl [name] is a 69 y.o. 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 Lynnell Jude, NP, he was s/p hip fracture, doing fairly well with his recovery, ? AF on his device, though ECG noted PACs, given single camber device, unable to know for sure. No changes were made He had No showed to a telehealth visit with Dr. Rayann Heman last month.  Via Zio through the HF clinic he was found to have AFib March 2021, also to have PVC burden of 11% Started on eliquis, underwent TEE/DCCV with ERAF a couple weeks later.  Referred to AF clinic planned for repeat DCCV given had been on amio a few weeks, HR was 62.  DCCV 12/21/19: successful  TEE march noted LVEF 20-25%, mod reduced RVEF, mod enlarged RA, LA mod enlarged, mod MR, amplatzer device without shunting  He comes today accompanied by his sister.  The patient feels well, no CP, palpitations or cardiac awareness since his DCCV.  He has no rest SOB, minimal DOE.  No near syncope or syncope, no shocks.  He has fallen a couple times in the last few months, his sister explains that he has an old orthopedic problem with his leg that has been broken a couple times and never healed right and this gives him trouble, but since the DCCV she thinks he looks better and more steady on his feet then he has in the prior several weeks.  He is certain these are not dizzy spells, or syncope.  He says he has gotten off balance and and could not  catch himself.  Denies head trauma  But scraped his arm a couple times. He saw his PMD yesterday who started him on antibiotic for a skin infection on his leg, is scheduled for LE venous US He was also found to have a L breast lump and recommended to have mammogram  He is doing OK with his medicines, paramedicine visit him.  He denies any bleeding or signs of bleeding   AFib Diagnosed Feb 2021  AAD Hx Amiodarone started Feb 2021  Device History: MDT single chamber ICD implanted 2016 for ICM   Past Medical History:  Diagnosis Date  . AICD (automatic cardioverter/defibrillator) present   . Anemia   . Anxiety   . Arthritis   . Brunner's gland hyperplasia of duodenum   . CHF (congestive heart failure) ([place])   . Coronary artery disease   . External hemorrhoids   . Fracture of femoral neck, left ([place]) 07/21/2018  . GERD (gastroesophageal reflux disease)   . Hearing loss   . HH (hiatus hernia)   . Internal hemorrhoids   . Ischemic cardiomyopathy   . Leg fracture, left   . Myocardial infarction ([place])   . Paronychia   . Pneumonia   . Psoriasis   . PUD (peptic ulcer disease)   . Schatzki's ring   . Tubular adenoma of colon   . Vitiligo  Past Surgical History:  Procedure Laterality Date  . ATRIAL SEPTAL DEFECT(ASD) CLOSURE N/A 12/30/2018   Procedure: ATRIAL SEPTAL DEFECT(ASD) CLOSURE;  Surgeon: [name], MD;  Location: [place] [facility];  Service: Cardiovascular;  Laterality: N/A;  . CARDIAC SURGERY    . CARDIOVERSION N/A 11/09/2019   Procedure: CARDIOVERSION;  Surgeon: [name], MD;  Location: [facility] ENDOSCOPY;  Service: Cardiovascular;  Laterality: N/A;  . CARDIOVERSION N/A 12/21/2019   Procedure: CARDIOVERSION;  Surgeon: [name], MD;  Location: [facility] ENDOSCOPY;  Service: Cardiovascular;  Laterality: N/A;  . COLONOSCOPY WITH ESOPHAGOGASTRODUODENOSCOPY (EGD)    . COLONOSCOPY WITH PROPOFOL N/A 08/24/2017   Procedure: COLONOSCOPY WITH PROPOFOL;  Surgeon:  [name], MD;  Location: [facility] ENDOSCOPY;  Service: Endoscopy;  Laterality: N/A;  . CORONARY ARTERY BYPASS GRAFT N/A 08/03/2014   Procedure: CORONARY ARTERY BYPASS GRAFTING (CABG);  Surgeon: [name], MD;  Location: [place];  Service: Open Heart Surgery;  Laterality: N/A;  . EP IMPLANTABLE DEVICE N/A 05/01/2015   MDT ICD implanted for primary prevention of sudden death  . ESOPHAGOGASTRODUODENOSCOPY (EGD) WITH PROPOFOL N/A 04/16/2015   Procedure: ESOPHAGOGASTRODUODENOSCOPY (EGD) WITH PROPOFOL;  Surgeon: [name], MD;  Location: [facility] ENDOSCOPY;  Service: Endoscopy;  Laterality: N/A;  . ESOPHAGOGASTRODUODENOSCOPY (EGD) WITH PROPOFOL N/A 08/24/2017   Procedure: ESOPHAGOGASTRODUODENOSCOPY (EGD) WITH PROPOFOL;  Surgeon: [name], MD;  Location: [facility] ENDOSCOPY;  Service: Endoscopy;  Laterality: N/A;  . FRACTURE SURGERY    . HERNIA REPAIR    . HIP PINNING,CANNULATED Left 07/22/2018   Procedure: CANNULATED HIP PINNING;  Surgeon: [name], MD;  Location: [place];  Service: Orthopedics;  Laterality: Left;  . MITRAL VALVE REPAIR N/A 12/30/2018   Procedure: MITRAL VALVE REPAIR;  Surgeon: [name], MD;  Location: [place] [facility];  Service: Cardiovascular;  Laterality: N/A;  . RIGHT/LEFT HEART CATH AND CORONARY/GRAFT ANGIOGRAPHY N/A 12/21/2018   Procedure: RIGHT/LEFT HEART CATH AND CORONARY/GRAFT ANGIOGRAPHY;  Surgeon: [name], MD;  Location: [place] [facility];  Service: Cardiovascular;  Laterality: N/A;  . TEE WITHOUT CARDIOVERSION N/A 08/03/2014   Procedure: TRANSESOPHAGEAL ECHOCARDIOGRAM (TEE);  Surgeon: [name], MD;  Location: [place];  Service: Open Heart Surgery;  Laterality: N/A;  . TEE WITHOUT CARDIOVERSION N/A 02/10/2018   Procedure: TRANSESOPHAGEAL ECHOCARDIOGRAM (TEE);  Surgeon: [name], MD;  Location: [facility] ENDOSCOPY;  Service: Cardiovascular;  Laterality: N/A;  . TEE WITHOUT CARDIOVERSION N/A 11/09/2019   Procedure: TRANSESOPHAGEAL  ECHOCARDIOGRAM (TEE);  Surgeon: [name], MD;  Location: [facility] ENDOSCOPY;  Service: Cardiovascular;  Laterality: N/A;    Current Outpatient Medications  Medication Sig Dispense Refill  . doxycycline (MONODOX) 100 MG capsule Take 100 mg by mouth daily. For 7 seven days    . acetaminophen (TYLENOL) 500 MG tablet Take 500-1,000 mg by mouth every 6 (six) hours as needed (for pain.).    Marland Kitchen allopurinol (ZYLOPRIM) 100 MG tablet Take 100 mg by mouth daily.    Marland Kitchen amiodarone (PACERONE) 200 MG tablet Take 1 tablet (200 mg total) by mouth daily. 90 tablet 1  . apixaban (ELIQUIS) 5 MG TABS tablet Take 1 tablet (5 mg total) by mouth 2 (two) times daily. 60 tablet 5  . Ascorbic Acid (VITAMIN C) 100 MG tablet Take 100 mg by mouth daily.    Marland Kitchen atorvastatin (LIPITOR) 40 MG tablet TAKE 1 TABLET (40 MG TOTAL) BY MOUTH DAILY AT 6 PM. (Patient taking differently: Take 40 mg by mouth at bedtime. ) 90 tablet  3  . carvedilol (COREG) 6.25 MG tablet Take 1 tablet (6.25 mg total) by mouth 2 (two) times daily. 180 tablet 3  . Cholecalciferol (VITAMIN D3 SUPER STRENGTH) 50 MCG (2000 UT) TABS Take 2,000 Units by mouth at bedtime.     . cyanocobalamin 500 MCG tablet Take 500 mcg by mouth at bedtime.     . digoxin (LANOXIN) 0.125 MG tablet Take 0.5 tablets (0.0625 mg total) by mouth every other day. (Patient taking differently: Take 0.0625 mg by mouth every other day. In the morning) 45 tablet 3  . DULoxetine (CYMBALTA) 30 MG capsule Take 30 mg by mouth every evening.     Marland Kitchen eplerenone (INSPRA) 50 MG tablet TAKE 1 TABLET BY MOUTH EVERY DAY (Patient taking differently: Take 50 mg by mouth daily. ) 90 tablet 2  . ferrous sulfate 325 (65 FE) MG tablet Take 1 tablet (325 mg total) by mouth daily with breakfast. (Patient taking differently: Take 325 mg by mouth daily. ) 30 tablet 3  . gabapentin (NEURONTIN) 300 MG capsule Take 300 mg by mouth at bedtime.     Marland Kitchen ketoconazole (NIZORAL) 2 % shampoo Apply 1 application topically 2 (two)  times a week.     Marland Kitchen KLOR-CON M20 20 MEQ tablet TAKE 1 TAB BY MOUTH TWICE A DAY. TAKE 1 EXTRA TABLET ON WEDNESDAY MORNING W/ METOLAZONE (Patient taking differently: Take 20 mEq by mouth See admin instructions. Taking 40 meq in morning and 20 meq in evening.   Extra 60 meq on Wednesday morning /with Metolazone.) 180 tablet 3  . metolazone (ZAROXOLYN) 2.5 MG tablet TAKE 1 TAB BY MOUTH ONCE A WEEK. WEDNESDAY MORNINGS. TAKE 1 EXTRA POTASSIUM TABLET WITH THIS (Patient taking differently: Take 2.5 mg by mouth every Wednesday. Morning) 5 tablet 11  . Multiple Vitamin (MULTIVITAMIN) tablet Take 1 tablet by mouth at bedtime.     . pantoprazole (PROTONIX) 40 MG tablet TAKE 1 TABLET BY MOUTH EVERY DAY 90 tablet 2  . torsemide (DEMADEX) 20 MG tablet Take 3 tablets (60 mg total) by mouth 2 (two) times daily. 540 tablet 0   No current facility-administered medications for this visit.    Allergies:   Spironolactone   Social History:  The patient  reports that he quit smoking about 7 years ago. He has never used smokeless tobacco. He reports that he does not drink alcohol or use drugs.   Family History:  The patient's family history includes CAD in his father; Diabetes in his brother; Heart Problems in his brother; Valvular heart disease in his mother.  ROS:  Please see the history of present illness.  All other systems are reviewed and otherwise negative.   PHYSICAL EXAM: VS:  BP 110/60   Pulse (!) 59   Ht 6' (1.829 m)   Wt 165 lb (74.8 kg)   BMI 22.38 kg/m  BMI: Body mass index is 22.38 kg/m. Well nourished, well developed, in no acute distress  HEENT: normocephalic, atraumatic  Neck: no JVD, carotid bruits or masses Cardiac: RRR; 1-2/6 SM, no rubs, or gallops Lungs:  CTA b/l, no wheezing, rhonchi or rales  Abd: soft, nontender MS: no deformity, advanced atrophy for age Ext: trace edema RLE, 1-2+ with erythema to LLE, no wounds  Skin: warm and dry, no rash Neuro:  No gross deficits  appreciated Psych: euthymic mood, full affect  ICD site is stable, no tethering or discomfort   EKG:  Done today and reviewed by myself shows SB 59, RBBB, 1st  degree AVblock  ICD interrogation done today and reviewed by myself: Battery and lead measurements are good No arrhythmias OptiVol has come down, no AF since his DCCV  11/09/2019: TEE IMPRESSIONS  1. Left ventricular ejection fraction, by estimation, is 20 to 25%. The  left ventricle has severely decreased function. The left ventricle  demonstrates global hypokinesis. The left ventricular internal cavity size  was moderately dilated.  2. Right ventricular systolic function is moderately reduced. The right  ventricular size is mildly enlarged. There is mildly elevated pulmonary  artery systolic pressure.  3. There was an Amplatzer atrial septal closure device present, no  shunting noted by color doppler. Left atrial size was moderately dilated.  No left atrial/left atrial appendage thrombus was detected.  4. Right atrial size was moderately dilated.  5. Peak RV-RA gradient 24 mmHg. Tricuspid valve regurgitation is moderate  to severe.  6. The aortic valve is tricuspid. Aortic valve regurgitation is trivial.  No aortic stenosis is present.  7. The mitral valve was s/p repair with 2 Mitraclip devices placed in the  A2-P2 area. There was moderate residual MR with PISA ERO 0.25 cm^2. Mean  gradient 3 mmHg across the mitral valve, no significant stenosis. No  pulmonary vein systolic flow reversal  noted.  8. Normal caliber thoracic aorta with mild plaque.    12/21/2018; LHC  Ost LAD to Prox LAD lesion is 100% stenosed.  Mid LM to Dist LM lesion is 99% stenosed.  Ost Cx to Prox Cx lesion is 100% stenosed.  Prox RCA lesion is 100% stenosed.  Seq SVG- OM1 and OM2 graft was visualized by angiography and is normal in caliber.  SVG graft was visualized by angiography and is normal in caliber.  SVG graft was  visualized by angiography and is normal in caliber.  LIMA graft was visualized by angiography and is normal in caliber.  Hemodynamic findings consistent with mitral valve regurgitation.   1.  Severe native vessel CAD with total occlusion of the RCA, LAD, and left circumflex and severe distal left main stenosis 2.  Status post multivessel CABG with continued patency of the LIMA to LAD, saphenous vein graft to first diagonal, sequential saphenous vein graft to OM 2 and OM 3, and saphenous vein graft to PDA 3.  Preserved cardiac output 4.  Large V waves consistent with severe mitral regurgitation  Recommendations: Continued evaluation by the multidisciplinary heart team for treatment options related to severe mitral insufficiency and acute on chronic systolic heart failure  Recent Labs: 01/18/2019: B Natriuretic Peptide 1,261.0 11/01/2019: ALT 16; TSH 0.874 12/21/2019: BUN 31; Creatinine, Ser 2.04; Hemoglobin 11.7; Platelets 310; Potassium 4.6; Sodium 131  09/20/2019: Cholesterol 138; HDL 48; LDL Cholesterol 78; Total CHOL/HDL Ratio 2.9; Triglycerides 61; VLDL 12   Estimated Creatinine Clearance: 36.7 mL/min (A) (by C-G formula based on SCr of 2.04 mg/dL (H)).   Wt Readings from Last 3 Encounters:  01/04/20 165 lb (74.8 kg)  12/07/19 162 lb (73.5 kg)  11/30/19 162 lb 3.2 oz (73.6 kg)     Other studies reviewed: Additional studies/records reviewed today include: summarized above  ASSESSMENT AND PLAN:  1. ICD     Intact function, no programming changes made  2. ICM 3. Chronic CHF     C/w AHF team     He has LLE swelling/erythema started yesterday on Abx for cellulitis by his PMD     Not other findings to suggest volume OL  4. CAD     No anginal  symptoms     C/w Dr. [name]  5. VHD, s/p Mitra clip 2020 6. ASD s/p Amplatzer closure     C/w Dr. [name]  7. Paroxysmal Afib     CHA2DS2Vasc is 3, on Xarelto, appropriately dosed by labs a couple weeks ago     Historically by review  of device check notes, there has been some prior AF burden though suspected to have been false with ectopy      confirmed by zio monitor 8. PVCs     Started on amiodarone     I will send my not to Dr. Rayann Heman and Dr. [name], thoughts towards long term strategy, given his youg age and hx of cirrhosis mentioned may not be ideal for longterm AAD   8. RLE cellulitis      Started on Abx yesterday by his PMD     They will follow up     Low suspicion of DVT given his Eliquis and plavix            Disposition: F/u with Dr. [name] as scheduled later this month, remotes Q 60mo.      Current medicines are reviewed at length with the patient today.  The patient did not have any concerns regarding medicines.  Venetia Night, PA-C 01/04/2020 6:38 PM     [place] [address] [phone] (office)  [phone] (fax)

## 2020-01-04 NOTE — Patient Instructions (Addendum)
Medication Instructions:   Your physician recommends that you continue on your current medications as directed. Please refer to the Current Medication list given to you today.  *If you need a refill on your cardiac medications before your next appointment, please call your pharmacy*   Lab Work: [place]   If you have labs (blood work) drawn today and your tests are completely normal, you will receive your results only by: Marland Kitchen MyChart Message (if you have MyChart) OR . A paper copy in the mail If you have any lab test that is abnormal or we need to change your treatment, we will call you to review the results.   Testing/Procedures: NONE ORDERED  TODAY   Follow-Up: At [facility], you and your health needs are our priority.  As part of our continuing mission to provide you with exceptional heart care, we have created designated Provider Care Teams.  These Care Teams include your primary Cardiologist (physician) and Advanced Practice Providers (APPs -  Physician Assistants and Nurse Practitioners) who all work together to provide you with the care you need, when you need it.  We recommend signing up for the patient portal called "MyChart".  Sign up information is provided on this After Visit Summary.  MyChart is used to connect with patients for Virtual Visits (Telemedicine).  Patients are able to view lab/test results, encounter notes, upcoming appointments, etc.  Non-urgent messages can be sent to your provider as well.   To learn more about what you can do with MyChart, go to [email or web address].    Your next appointment:  1 year   The format for your next appointment:   In Person  Provider:   You may see  one of the following Advanced Practice Providers on your designated Care Team:    [name], NP  Tommye Standard, PA-C  Legrand Como "Oda Kilts, [place]    Other Instructions

## 2020-01-05 ENCOUNTER — Encounter (HOSPITAL_COMMUNITY): Payer: Self-pay

## 2020-01-05 ENCOUNTER — Other Ambulatory Visit (HOSPITAL_COMMUNITY): Payer: Self-pay

## 2020-01-05 NOTE — Progress Notes (Signed)
Today had a home visit with Carl [name].  He appears tired today, kept sleeping.  He states feeling ok except for his legs hurting.  His left leg and foot is swollen.  He has been to his PCP and is on antibiotics for it.  They have scanned for blood clot and found none.  Tender to palpation.  He is wearing socks.  Left leg and foot has no swelling.  Appetite is good.  He weighed this morning, he has been forgetting.  He is mad today, fussing with his brother.  Tried talking to him about it.  He has all his medications.  Looks in his empty boxes and he had left his digoxin in his slot this morning, gave it to him and he took him.  Advised him to be very careful about leaving any in it.  All other slots are empty.  Carl [name] his brother called me and advised his PCP told him he was too dry from appt on Tuesday and to stop metolazone every week and take it every other week.  Contacted HF clinic and spoke with [name] and she advised to get him to take it every week to keep fluid off.  Carl [name] had took it out of his box this Wednesday so I gave it to Carl [name] while I was there with extra potassium.  I filled his med boxes up.  I advised him to take the top box next due to his antibiotics are in it.  He has 3 packs of pepsi in his kitchen, advised him importance of not drinking too many.  His sister takes him shopping and she lets him buy anything.  Can not get her to understand diet that Carl [name] needs to follow.  Will continue to visit for heart failure, diet and medication management.   [name] [phone]

## 2020-01-09 ENCOUNTER — Encounter: Payer: Self-pay | Admitting: Podiatry

## 2020-01-09 ENCOUNTER — Ambulatory Visit (INDEPENDENT_AMBULATORY_CARE_PROVIDER_SITE_OTHER): Payer: Medicare Other | Admitting: Podiatry

## 2020-01-09 ENCOUNTER — Other Ambulatory Visit (HOSPITAL_COMMUNITY): Payer: Self-pay

## 2020-01-09 ENCOUNTER — Other Ambulatory Visit: Payer: Self-pay

## 2020-01-09 DIAGNOSIS — I739 Peripheral vascular disease, unspecified: Secondary | ICD-10-CM | POA: Diagnosis not present

## 2020-01-09 DIAGNOSIS — M79675 Pain in left toe(s): Secondary | ICD-10-CM | POA: Diagnosis not present

## 2020-01-09 DIAGNOSIS — B351 Tinea unguium: Secondary | ICD-10-CM

## 2020-01-09 DIAGNOSIS — D689 Coagulation defect, unspecified: Secondary | ICD-10-CM

## 2020-01-09 DIAGNOSIS — M79674 Pain in right toe(s): Secondary | ICD-10-CM

## 2020-01-09 NOTE — Progress Notes (Signed)
This patient returns to my office for at risk foot care.  This patient requires this care by a professional since this patient will be at risk due to having coagulation defect.  This patient is unable to cut nails himself since the patient cannot reach his nails.These nails are painful walking and wearing shoes.  This patient presents for at risk foot care today.  General Appearance  Alert, conversant and in no acute stress.  Vascular  Dorsalis pedis and posterior tibial  pulses are not  palpable  bilaterally.  Capillary return is within normal limits  bilaterally. Temperature is within normal limits  bilaterally.  Neurologic  Senn-Weinstein monofilament wire test within normal limits  bilaterally. Muscle power within normal limits bilaterally.  Nails Thick disfigured discolored nails with subungual debris  from hallux to fifth toes bilaterally. No evidence of bacterial infection or drainage bilaterally.  Orthopedic  No limitations of motion  feet .  No crepitus or effusions noted.  No bony pathology or digital deformities noted. 1/2 inch limb length.  Skin  normotropic skin with no porokeratosis noted bilaterally.  No signs of infections or ulcers noted.     Onychomycosis  Pain in right toes  Pain in left toes  Consent was obtained for treatment procedures.   Mechanical debridement of nails 1-5  bilaterally performed with a nail nipper.  Filed with dremel without incident.    Return office visit   3 months                  Told patient to return for periodic foot care and evaluation due to potential at risk complications.   Gardiner Barefoot DPM

## 2020-01-09 NOTE — Progress Notes (Signed)
Wrong encounter. 
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
## 2020-01-12 ENCOUNTER — Other Ambulatory Visit: Payer: Self-pay

## 2020-01-12 ENCOUNTER — Encounter (HOSPITAL_COMMUNITY): Payer: Self-pay

## 2020-01-12 ENCOUNTER — Ambulatory Visit (HOSPITAL_COMMUNITY)
Admission: RE | Admit: 2020-01-12 | Discharge: 2020-01-12 | Disposition: A | Discharge status: Home / Self Care | Payer: Medicare Other | Source: Ambulatory Visit | Attending: Cardiology | Admitting: Cardiology

## 2020-01-12 VITALS — BP 116/72 | HR 54 | Wt 153.0 lb

## 2020-01-12 DIAGNOSIS — N182 Chronic kidney disease, stage 2 (mild): Secondary | ICD-10-CM

## 2020-01-12 DIAGNOSIS — Z8781 Personal history of (healed) traumatic fracture: Secondary | ICD-10-CM | POA: Insufficient documentation

## 2020-01-12 DIAGNOSIS — F329 Major depressive disorder, single episode, unspecified: Secondary | ICD-10-CM | POA: Insufficient documentation

## 2020-01-12 DIAGNOSIS — I4510 Unspecified right bundle-branch block: Secondary | ICD-10-CM | POA: Insufficient documentation

## 2020-01-12 DIAGNOSIS — F70 Mild intellectual disabilities: Secondary | ICD-10-CM | POA: Diagnosis not present

## 2020-01-12 DIAGNOSIS — Z87891 Personal history of nicotine dependence: Secondary | ICD-10-CM | POA: Diagnosis not present

## 2020-01-12 DIAGNOSIS — N183 Chronic kidney disease, stage 3 unspecified: Secondary | ICD-10-CM | POA: Diagnosis not present

## 2020-01-12 DIAGNOSIS — Z951 Presence of aortocoronary bypass graft: Secondary | ICD-10-CM | POA: Diagnosis not present

## 2020-01-12 DIAGNOSIS — K7460 Unspecified cirrhosis of liver: Secondary | ICD-10-CM | POA: Diagnosis not present

## 2020-01-12 DIAGNOSIS — Z888 Allergy status to other drugs, medicaments and biological substances status: Secondary | ICD-10-CM | POA: Insufficient documentation

## 2020-01-12 DIAGNOSIS — I5022 Chronic systolic (congestive) heart failure: Secondary | ICD-10-CM

## 2020-01-12 DIAGNOSIS — I480 Paroxysmal atrial fibrillation: Secondary | ICD-10-CM

## 2020-01-12 DIAGNOSIS — I2510 Atherosclerotic heart disease of native coronary artery without angina pectoris: Secondary | ICD-10-CM | POA: Diagnosis not present

## 2020-01-12 DIAGNOSIS — Z8249 Family history of ischemic heart disease and other diseases of the circulatory system: Secondary | ICD-10-CM | POA: Diagnosis not present

## 2020-01-12 DIAGNOSIS — I130 Hypertensive heart and chronic kidney disease with heart failure and stage 1 through stage 4 chronic kidney disease, or unspecified chronic kidney disease: Secondary | ICD-10-CM | POA: Insufficient documentation

## 2020-01-12 DIAGNOSIS — Z79899 Other long term (current) drug therapy: Secondary | ICD-10-CM | POA: Insufficient documentation

## 2020-01-12 DIAGNOSIS — Z95810 Presence of automatic (implantable) cardiac defibrillator: Secondary | ICD-10-CM

## 2020-01-12 DIAGNOSIS — I340 Nonrheumatic mitral (valve) insufficiency: Secondary | ICD-10-CM | POA: Insufficient documentation

## 2020-01-12 DIAGNOSIS — I4891 Unspecified atrial fibrillation: Secondary | ICD-10-CM | POA: Diagnosis not present

## 2020-01-12 DIAGNOSIS — I255 Ischemic cardiomyopathy: Secondary | ICD-10-CM

## 2020-01-12 DIAGNOSIS — Z7901 Long term (current) use of anticoagulants: Secondary | ICD-10-CM | POA: Diagnosis not present

## 2020-01-12 DIAGNOSIS — Z833 Family history of diabetes mellitus: Secondary | ICD-10-CM | POA: Diagnosis not present

## 2020-01-12 LAB — HEPATIC FUNCTION PANEL
ALT: 19 U/L (ref 0–44)
AST: 25 U/L (ref 15–41)
Albumin: 3.8 g/dL (ref 3.5–5.0)
Alkaline Phosphatase: 129 U/L — ABNORMAL HIGH (ref 38–126)
Bilirubin, Direct: 0.3 mg/dL — ABNORMAL HIGH (ref 0.0–0.2)
Indirect Bilirubin: 1.2 mg/dL — ABNORMAL HIGH (ref 0.3–0.9)
Total Bilirubin: 1.5 mg/dL — ABNORMAL HIGH (ref 0.3–1.2)
Total Protein: 7.8 g/dL (ref 6.5–8.1)

## 2020-01-12 LAB — BASIC METABOLIC PANEL
Anion gap: 15 (ref 5–15)
BUN: 64 mg/dL — ABNORMAL HIGH (ref 8–23)
CO2: 28 mmol/L (ref 22–32)
Calcium: 9.8 mg/dL (ref 8.9–10.3)
Chloride: 86 mmol/L — ABNORMAL LOW (ref 98–111)
Creatinine, Ser: 2 mg/dL — ABNORMAL HIGH (ref 0.61–1.24)
GFR calc Af Amer: 39 mL/min — ABNORMAL LOW (ref >60)
GFR calc non Af Amer: 33 mL/min — ABNORMAL LOW (ref >60)
Glucose, Bld: 103 mg/dL — ABNORMAL HIGH (ref 70–99)
Potassium: 3.6 mmol/L (ref 3.5–5.1)
Sodium: 129 mmol/L — ABNORMAL LOW (ref 135–145)

## 2020-01-12 LAB — TSH: TSH: 1.51 u[IU]/mL (ref 0.350–4.500)

## 2020-01-12 LAB — DIGOXIN LEVEL: Digoxin Level: 1.1 ng/mL (ref 0.8–2.0)

## 2020-01-12 NOTE — Progress Notes (Signed)
6MW  completed patient ambulated 660 ft (29m) without difficulty or rest breaks. HR ranged 62-71 o2 sats 97-99 % on room air

## 2020-01-12 NOTE — Progress Notes (Signed)
Noticed [name] labs today and noticed note from [employer] to stop digoxin due to levels being high.  Will pull these from [place] box and stop starting 01/13/20.  [place] [phone]

## 2020-01-12 NOTE — Patient Instructions (Signed)
It was great to see you today! No medication changes are needed at this time.  Labs today We will only contact you if something comes back abnormal or we need to make some changes. Otherwise no news is good news!  Your physician recommends that you schedule a follow-up appointment in: 3 months with Dr [name]  Do the following things EVERYDAY: 1) Weigh yourself in the morning before breakfast. Write it down and keep it in a log. 2) Take your medicines as prescribed 3) Eat low salt foods--Limit salt (sodium) to 2000 mg per day.  4) Stay as active as you can everyday 5) Limit all fluids for the day to less than 2 liters

## 2020-01-13 ENCOUNTER — Telehealth (HOSPITAL_COMMUNITY): Payer: Self-pay | Admitting: Cardiology

## 2020-01-13 NOTE — Telephone Encounter (Signed)
-----   Message from Conrad Paauilo, NP sent at 01/12/2020  4:35 PM EDT ----- Dig level high. Stop digoxin.

## 2020-01-13 NOTE — Telephone Encounter (Signed)
PT AWARE VIA BROTHER MIKE

## 2020-01-16 ENCOUNTER — Ambulatory Visit (INDEPENDENT_AMBULATORY_CARE_PROVIDER_SITE_OTHER): Payer: Medicare Other

## 2020-01-16 DIAGNOSIS — I5022 Chronic systolic (congestive) heart failure: Secondary | ICD-10-CM

## 2020-01-16 DIAGNOSIS — Z95810 Presence of automatic (implantable) cardiac defibrillator: Secondary | ICD-10-CM

## 2020-01-17 NOTE — Progress Notes (Signed)
EPIC Encounter for ICM Monitoring  Patient Name: Carl [name] is a 69 y.o. male Date: 01/17/2020 Primary Care Physican: [name], MD Primary Cardiologist:[place] Electrophysiologist: Allred 11/22/2019 OfficeWeight: 158lbs  Time in AF 0.0 hr/day (0.0%)  Transmission reviewed.  Cardioversion procedure 12/21/2019.  OptivolThoracic impedancenormal.  Prescribed:  Torsemide20mg take3tablets(60mg  total)by mouthtwice a day.  Metolazone 2.5 mg take 1 tablet by mouth every Wednesday morning with 1 extra Potassium.   Klor Con 20 mEqTaking 40 meq in morning and 20 meq in evening.  Extra 20 meq on Wednesday morning /with Metolazone.  Labs: 01/12/2020 Creatinine 2.00, BUN 64, Potassium 3.6, Sodium 129, GFR 33-39 12/21/2019 Creatinine 2.04, BUN 31, Potassium 4.6, Sodium 131, GFR 33-38  12/19/2019 Creatinine 2.04, BUN 35, Potassium 4.2, Sodium 128, GFR 33-38  11/30/2019 Creatinine 1.87, BUN 38, Potassium 4.6, Sodium 131, GFR 36-42 A complete set of results can be found in Results Review  Recommendations:None  Follow-up plan: ICM clinic phone appointment on6/28/2021. 91 day device clinic remote transmission6/10/2019.  Copy of ICM check sent to Dr.Allred.  3 month ICM trend: 01/17/2020     1 Year ICM trend:       Rosalene Billings, RN 01/17/2020 9:38 AM

## 2020-01-18 ENCOUNTER — Other Ambulatory Visit (HOSPITAL_COMMUNITY): Payer: Self-pay

## 2020-01-18 ENCOUNTER — Encounter (HOSPITAL_COMMUNITY): Payer: Self-pay

## 2020-01-18 NOTE — Progress Notes (Signed)
Today had a home visit with Carl [name].  No one has picked his medications up yet, he has enough til next Saturday fixed.  Will visit again next week to refill his med boxes.  He states his legs are hurting today, he has no edema in either leg, swelling has went down.  He has appt with vascular tomorrow.  He is walking without difficulty.  Him and his sister are always on the go, they both love to shop.  It appears he is taking his medications.  He states has cut back on pepsi, drinking 1 bottle at home a day.  Drinks tea when goes out to eat.  He states drinking water in between but can not tell me how much.  Discussed low sodium foods with him.  He states would like to come off of some of his medications.  Talked about high salty foods when eats out.  Talked about snack foods.  He denies any chest pain, headaches, dizziness or shortness of breath.  He has fallen again in past week, hurt his shoulders and has large abrasion on elbow.  He states he loses his balance.  Advised him to start using his cane when he leaves the house.  Told him to practice in the house first walking with it.  Advised his brother Carl [name] same.  Told Carl [name] not to go outside when home alone.  Carl [name] has told him the same.  Carl [name] mailbox is almost at his neighbors, advised Carl [name] he could get a mailbox placed on house and get [place] PCP to write a note due to medical problems he needs his mail delivered to door.  Carl [name] states he will do that.  Carl [name] gets something in his mind, he will do it no what we recommend.  He wants to be able to get his own mail, he states he would like to get it at the door.  He stays home mostly in morning and during the early day by his self.  He gets bored and lonely.  He watches tv mostly and does not move a lot.  He props his feet on a stool and does not use his recliner.  Will continue to visit for heart failure, diet and medication compliance.   [place] [phone]

## 2020-01-19 ENCOUNTER — Ambulatory Visit (INDEPENDENT_AMBULATORY_CARE_PROVIDER_SITE_OTHER): Payer: Medicare Other

## 2020-01-19 ENCOUNTER — Encounter (INDEPENDENT_AMBULATORY_CARE_PROVIDER_SITE_OTHER): Payer: Self-pay | Admitting: Vascular Surgery

## 2020-01-19 ENCOUNTER — Other Ambulatory Visit: Payer: Self-pay

## 2020-01-19 ENCOUNTER — Ambulatory Visit (INDEPENDENT_AMBULATORY_CARE_PROVIDER_SITE_OTHER): Payer: Medicare Other | Admitting: Vascular Surgery

## 2020-01-19 VITALS — BP 97/62 | HR 56 | Ht 72.0 in | Wt 156.0 lb

## 2020-01-19 DIAGNOSIS — I739 Peripheral vascular disease, unspecified: Secondary | ICD-10-CM

## 2020-01-19 DIAGNOSIS — I2510 Atherosclerotic heart disease of native coronary artery without angina pectoris: Secondary | ICD-10-CM | POA: Diagnosis not present

## 2020-01-19 DIAGNOSIS — E785 Hyperlipidemia, unspecified: Secondary | ICD-10-CM

## 2020-01-19 DIAGNOSIS — I82622 Acute embolism and thrombosis of deep veins of left upper extremity: Secondary | ICD-10-CM | POA: Diagnosis not present

## 2020-01-19 DIAGNOSIS — I10 Essential (primary) hypertension: Secondary | ICD-10-CM

## 2020-01-19 NOTE — Progress Notes (Signed)
MRN : [id number]  Carl [name] is a 69 y.o. (1950/10/25) male who presents with chief complaint of  Chief Complaint  Patient presents with  . Follow-up    U/S Follow up  .  History of Present Illness:   The patient presents to the office for evaluation of left arm DVT.  DVT was identified in the ER at [facility] by Duplex ultrasound.  The initial symptoms were pain and swelling in the upper extremity.  The patient notes the arm continues to be very painful with dependency and is still swollen.  Symptoms are much better with elevation.  The patient notes minimal edema in the morning which steadily worsens throughout the day.    The patient has not been using compression therapy at this point.  He is also c/o leg pain particularly with walking.  No ulcers no rest pain.  No SOB or pleuritic chest pains.  No cough or hemoptysis.  No blood per rectum or blood in any sputum.  No excessive bruising per the patient.   Current Meds  Medication Sig  . acetaminophen (TYLENOL) 500 MG tablet Take 500-1,000 mg by mouth every 6 (six) hours as needed (for pain.).  Marland Kitchen allopurinol (ZYLOPRIM) 100 MG tablet Take 100 mg by mouth daily.  Marland Kitchen apixaban (ELIQUIS) 5 MG TABS tablet Take 1 tablet (5 mg total) by mouth 2 (two) times daily.  . Ascorbic Acid (VITAMIN C) 100 MG tablet Take 100 mg by mouth daily.  Marland Kitchen atorvastatin (LIPITOR) 40 MG tablet TAKE 1 TABLET (40 MG TOTAL) BY MOUTH DAILY AT 6 PM. (Patient taking differently: Take 40 mg by mouth at bedtime. )  . carvedilol (COREG) 6.25 MG tablet Take 1 tablet (6.25 mg total) by mouth 2 (two) times daily.  Marland Kitchen eplerenone (INSPRA) 50 MG tablet TAKE 1 TABLET BY MOUTH EVERY DAY (Patient taking differently: Take 50 mg by mouth daily. )  . ferrous sulfate 325 (65 FE) MG tablet Take 1 tablet (325 mg total) by mouth daily with breakfast. (Patient taking differently: Take 325 mg by mouth daily. )  . gabapentin (NEURONTIN) 300 MG capsule Take 300 mg by mouth at bedtime.     Marland Kitchen ketoconazole (NIZORAL) 2 % shampoo Apply 1 application topically 2 (two) times a week.   . losartan (COZAAR) 25 MG tablet Take by mouth.  . metolazone (ZAROXOLYN) 2.5 MG tablet TAKE 1 TAB BY MOUTH ONCE A WEEK. WEDNESDAY MORNINGS. TAKE 1 EXTRA POTASSIUM TABLET WITH THIS (Patient taking differently: Take 2.5 mg by mouth every Wednesday. Morning)  . Multiple Vitamin (MULTIVITAMIN) tablet Take 1 tablet by mouth at bedtime.   . pantoprazole (PROTONIX) 40 MG tablet TAKE 1 TABLET BY MOUTH EVERY DAY  . torsemide (DEMADEX) 20 MG tablet Take 3 tablets (60 mg total) by mouth 2 (two) times daily.    Past Medical History:  Diagnosis Date  . AICD (automatic cardioverter/defibrillator) present   . Anemia   . Anxiety   . Arthritis   . Brunner's gland hyperplasia of duodenum   . CHF (congestive heart failure) ([place])   . Coronary artery disease   . External hemorrhoids   . Fracture of femoral neck, left ([place]) 07/21/2018  . GERD (gastroesophageal reflux disease)   . Hearing loss   . HH (hiatus hernia)   . Internal hemorrhoids   . Ischemic cardiomyopathy   . Leg fracture, left   . Myocardial infarction ([place])   . Paronychia   . Pneumonia   . Psoriasis   .  PUD (peptic ulcer disease)   . Schatzki's ring   . Tubular adenoma of colon   . Vitiligo     Past Surgical History:  Procedure Laterality Date  . ATRIAL SEPTAL DEFECT(ASD) CLOSURE N/A 12/30/2018   Procedure: ATRIAL SEPTAL DEFECT(ASD) CLOSURE;  Surgeon: [name], MD;  Location: [place] [facility];  Service: Cardiovascular;  Laterality: N/A;  . CARDIAC SURGERY    . CARDIOVERSION N/A 11/09/2019   Procedure: CARDIOVERSION;  Surgeon: [name], MD;  Location: [facility] ENDOSCOPY;  Service: Cardiovascular;  Laterality: N/A;  . CARDIOVERSION N/A 12/21/2019   Procedure: CARDIOVERSION;  Surgeon: [name], MD;  Location: [facility] ENDOSCOPY;  Service: Cardiovascular;  Laterality: N/A;  . COLONOSCOPY WITH ESOPHAGOGASTRODUODENOSCOPY (EGD)    .  COLONOSCOPY WITH PROPOFOL N/A 08/24/2017   Procedure: COLONOSCOPY WITH PROPOFOL;  Surgeon: [name], MD;  Location: [facility] ENDOSCOPY;  Service: Endoscopy;  Laterality: N/A;  . CORONARY ARTERY BYPASS GRAFT N/A 08/03/2014   Procedure: CORONARY ARTERY BYPASS GRAFTING (CABG);  Surgeon: [name], MD;  Location: [place];  Service: Open Heart Surgery;  Laterality: N/A;  . EP IMPLANTABLE DEVICE N/A 05/01/2015   MDT ICD implanted for primary prevention of sudden death  . ESOPHAGOGASTRODUODENOSCOPY (EGD) WITH PROPOFOL N/A 04/16/2015   Procedure: ESOPHAGOGASTRODUODENOSCOPY (EGD) WITH PROPOFOL;  Surgeon: [name], MD;  Location: [facility] ENDOSCOPY;  Service: Endoscopy;  Laterality: N/A;  . ESOPHAGOGASTRODUODENOSCOPY (EGD) WITH PROPOFOL N/A 08/24/2017   Procedure: ESOPHAGOGASTRODUODENOSCOPY (EGD) WITH PROPOFOL;  Surgeon: [name], MD;  Location: [facility] ENDOSCOPY;  Service: Endoscopy;  Laterality: N/A;  . FRACTURE SURGERY    . HERNIA REPAIR    . HIP PINNING,CANNULATED Left 07/22/2018   Procedure: CANNULATED HIP PINNING;  Surgeon: [name], MD;  Location: [place];  Service: Orthopedics;  Laterality: Left;  . MITRAL VALVE REPAIR N/A 12/30/2018   Procedure: MITRAL VALVE REPAIR;  Surgeon: [name], MD;  Location: [place] [facility];  Service: Cardiovascular;  Laterality: N/A;  . RIGHT/LEFT HEART CATH AND CORONARY/GRAFT ANGIOGRAPHY N/A 12/21/2018   Procedure: RIGHT/LEFT HEART CATH AND CORONARY/GRAFT ANGIOGRAPHY;  Surgeon: [name], MD;  Location: [place] [facility];  Service: Cardiovascular;  Laterality: N/A;  . TEE WITHOUT CARDIOVERSION N/A 08/03/2014   Procedure: TRANSESOPHAGEAL ECHOCARDIOGRAM (TEE);  Surgeon: [name], MD;  Location: [place];  Service: Open Heart Surgery;  Laterality: N/A;  . TEE WITHOUT CARDIOVERSION N/A 02/10/2018   Procedure: TRANSESOPHAGEAL ECHOCARDIOGRAM (TEE);  Surgeon: [name], MD;  Location: [facility] ENDOSCOPY;  Service: Cardiovascular;   Laterality: N/A;  . TEE WITHOUT CARDIOVERSION N/A 11/09/2019   Procedure: TRANSESOPHAGEAL ECHOCARDIOGRAM (TEE);  Surgeon: [name], MD;  Location: [facility] ENDOSCOPY;  Service: Cardiovascular;  Laterality: N/A;    Social History Social History   Tobacco Use  . Smoking status: Former Smoker    Quit date: 2014    Years since quitting: 7.4  . Smokeless tobacco: Never Used  . Tobacco comment: 07/30/2017 Quit in 2014  Substance Use Topics  . Alcohol use: No    Alcohol/week: 0.0 standard drinks  . Drug use: No    Family History Family History  Problem Relation Age of Onset  . Valvular heart disease Mother        Ruptured valve  . CAD Father   . Heart Problems Brother        Stents x 4  . Diabetes Brother   . Prostate cancer Neg Hx   . Bladder Cancer Neg Hx   .  Kidney cancer Neg Hx     Allergies  Allergen Reactions  . Spironolactone Other (See Comments)    gynecomastia      REVIEW OF SYSTEMS (Negative unless checked)  Constitutional: [] Weight loss  [] Fever  [] Chills Cardiac: [] Chest pain   [] Chest pressure   [] Palpitations   [] Shortness of breath when laying flat   [] Shortness of breath with exertion. Vascular:  [x] Pain in legs with walking   [] Pain in legs at rest  [x] History of DVT   [] Phlebitis   [] Swelling in legs   [] Varicose veins   [] Non-healing ulcers Pulmonary:   [] Uses home oxygen   [] Productive cough   [] Hemoptysis   [] Wheeze  [] COPD   [] Asthma Neurologic:  [] Dizziness   [] Seizures   [] History of stroke   [] History of TIA  [] Aphasia   [] Vissual changes   [] Weakness or numbness in arm   [] Weakness or numbness in leg Musculoskeletal:   [x] Joint swelling   [] Joint pain   [] Low back pain Hematologic:  [] Easy bruising  [] Easy bleeding   [] Hypercoagulable state   [] Anemic Gastrointestinal:  [] Diarrhea   [] Vomiting  [] Gastroesophageal reflux/heartburn   [] Difficulty swallowing. Genitourinary:  [] Chronic kidney disease   [] Difficult urination  [] Frequent urination    [] Blood in urine Skin:  [] Rashes   [] Ulcers  Psychological:  [] History of anxiety   []  History of major depression.  Physical Examination  Vitals:   01/19/20 1049  BP: 97/62  Pulse: (!) 56  Weight: 156 lb (70.8 kg)  Height: 6' (1.829 m)   Body mass index is 21.16 kg/m. Gen: WD/WN, NAD Head: [place]/AT, No temporalis wasting.  Ear/Nose/Throat: Hearing grossly intact, nares w/o erythema or drainage Eyes: PER, EOMI, sclera nonicteric.  Neck: Supple, no large masses.   Pulmonary:  Good air movement, no audible wheezing bilaterally, no use of accessory muscles.  Cardiac: RRR, no JVD Vascular:  No arm swelling; trace edema of the legs no wounds Vessel Right Left  Radial Palpable Palpable  PT Not Palpable Not Palpable  DP Not Palpable Not Palpable  Gastrointestinal: Non-distended. No guarding/no peritoneal signs.  Musculoskeletal: M/S 5/5 throughout.  No deformity or atrophy.  Neurologic: CN 2-12 intact. Symmetrical.  Speech is fluent. Motor exam as listed above. Psychiatric: Judgment intact, Mood & affect appropriate for pt's clinical situation. Dermatologic: No rashes or ulcers noted.  No changes consistent with cellulitis.   CBC Lab Results  Component Value Date   WBC 7.3 12/21/2019   HGB 11.7 (L) 12/21/2019   HCT 36.7 (L) 12/21/2019   MCV 102.2 (H) 12/21/2019   PLT 310 12/21/2019    BMET    Component Value Date/Time   NA 129 (L) 01/12/2020 1445   NA 136 10/14/2019 1451   NA 136 07/27/2014 0203   K 3.6 01/12/2020 1445   K 4.8 07/27/2014 0203   CL 86 (L) 01/12/2020 1445   CL 97 (L) 07/27/2014 0203   CO2 28 01/12/2020 1445   CO2 28 07/27/2014 0203   GLUCOSE 103 (H) 01/12/2020 1445   GLUCOSE 131 (H) 07/27/2014 0203   BUN 64 (H) 01/12/2020 1445   BUN 23 10/14/2019 1451   BUN 25 (H) 07/27/2014 0203   CREATININE 2.00 (H) 01/12/2020 1445   CREATININE 1.60 (H) 07/27/2014 0203   CALCIUM 9.8 01/12/2020 1445   CALCIUM 8.8 07/27/2014 0203   GFRNONAA 33 (L) 01/12/2020 1445     GFRNONAA 47 (L) 07/27/2014 0203   GFRAA 39 (L) 01/12/2020 1445   GFRAA 57 (L) 07/27/2014 0203  Estimated Creatinine Clearance: 35.4 mL/min (A) (by C-G formula based on SCr of 2 mg/dL (H)).  COAG Lab Results  Component Value Date   INR 1.4 (H) 12/27/2018   INR 1.21 07/21/2018   INR 1.27 08/03/2014    Radiology US Venous Img Lower Unilateral Left (DVT)  Result Date: 01/02/2020 CLINICAL DATA:  Redness and swelling of the leg. EXAM: RIGHT LOWER EXTREMITY VENOUS DOPPLER ULTRASOUND TECHNIQUE: Gray-scale sonography with compression, as well as color and duplex ultrasound, were performed to evaluate the deep venous system(s) from the level of the common femoral vein through the popliteal and proximal calf veins. COMPARISON:  None. FINDINGS: VENOUS Normal compressibility of the common femoral, superficial femoral, and popliteal veins, as well as the visualized calf veins. Visualized portions of profunda femoral vein and great saphenous vein unremarkable. No filling defects to suggest DVT on grayscale or color Doppler imaging. Doppler waveforms show normal direction of venous flow, normal respiratory plasticity and response to augmentation. Limited views of the contralateral common femoral vein are unremarkable. OTHER None. Limitations: none IMPRESSION: Negative. Electronically Signed   By: Constance Holster M.D.   On: 01/02/2020 16:40     Assessment/Plan 1. Arm DVT (deep venous thromboembolism), acute, left (HCC) Recommend:   No surgery or intervention at this point in time.  IVC filter is not indicated at present.  Patient's duplex ultrasound of the venous system shows DVT in the left brachial vein has resolved.  The patient is OK to stop anticoagulation   I have had a long discussion with the patient regarding DVT and post phlebitic changes such as swelling and why it  causes symptoms such as pain.  The patient will wear graduated compression stockings class 1 (20-30 mmHg), beginning  after three full days of anticoagulation, on a daily basis a prescription was given. The patient will  beginning wearing the stockings first thing in the morning and removing them in the evening. The patient is instructed specifically not to sleep in the stockings.  In addition, behavioral modification including elevation during the day and avoidance of prolonged dependency will be initiated.    The patient will continue anticoagulation for now as there have not been any problems or complications at this point.    2. PAD (peripheral artery disease) (HCC) Recommend:  Patient should undergo ABI's of the lower extremity  because there has been a worsening of the patient's lower extremity symptoms.  The patient states they are having increased pain and a marked decrease in the distance that they can walk.  The risks and benefits as well as the alternatives were discussed in detail with the patient.  All questions were answered.  Patient agrees to proceed and understands this could be a prelude to angiography and intervention.  The patient will follow up with me in the office to review the studies.   - VAS [place] ABI WITH/WO TBI; Future  3. Coronary artery disease involving native coronary artery of native heart without angina pectoris Continue cardiac and antihypertensive medications as already ordered and reviewed, no changes at this time.  Continue statin as ordered and reviewed, no changes at this time  Nitrates PRN for chest pain   4. Essential hypertension Continue antihypertensive medications as already ordered, these medications have been reviewed and there are no changes at this time.   5. Hyperlipidemia, unspecified hyperlipidemia type Continue statin as ordered and reviewed, no changes at this time     Hortencia Pilar, MD  01/19/2020 11:11 AM

## 2020-01-21 ENCOUNTER — Encounter (INDEPENDENT_AMBULATORY_CARE_PROVIDER_SITE_OTHER): Payer: Self-pay | Admitting: Vascular Surgery

## 2020-01-26 ENCOUNTER — Encounter (HOSPITAL_COMMUNITY): Payer: Self-pay

## 2020-01-26 ENCOUNTER — Other Ambulatory Visit (HOSPITAL_COMMUNITY): Payer: Self-pay

## 2020-01-26 ENCOUNTER — Ambulatory Visit (INDEPENDENT_AMBULATORY_CARE_PROVIDER_SITE_OTHER): Payer: Medicare Other | Admitting: *Deleted

## 2020-01-26 DIAGNOSIS — I255 Ischemic cardiomyopathy: Secondary | ICD-10-CM

## 2020-01-26 LAB — CUP PACEART REMOTE DEVICE CHECK
Battery Remaining Longevity: 83 mo
Battery Voltage: 2.92 V
Brady Statistic RV Percent Paced: 0.13 %
Date Time Interrogation Session: 20210603002202
HighPow Impedance: 86 Ohm
Implantable Lead Implant Date: 20160906
Implantable Lead Location: 753860
Implantable Pulse Generator Implant Date: 20160906
Lead Channel Impedance Value: 342 Ohm
Lead Channel Impedance Value: 399 Ohm
Lead Channel Pacing Threshold Amplitude: 0.625 V
Lead Channel Pacing Threshold Pulse Width: 0.4 ms
Lead Channel Sensing Intrinsic Amplitude: 11.875 mV
Lead Channel Sensing Intrinsic Amplitude: 11.875 mV
Lead Channel Setting Pacing Amplitude: 2.5 V
Lead Channel Setting Pacing Pulse Width: 0.4 ms
Lead Channel Setting Sensing Sensitivity: 0.3 mV

## 2020-01-26 NOTE — Progress Notes (Signed)
Today had a home visit with Carl [name].  He is doing ok, ambulating in the house well today.  His complaint today is his right hand is hurting.  Lungs are clear, he has been having appts with Vascular.  It appears he has been taking his medications.  Verified his meds and filled his med boxes up.  Did not call in any medications this visit.  His weight has fluctuated in past week, last Saturday he had down weighed 159 lbs, not sure if he is weighing right.  It is 151 lbs today.  His brother [name] is aware of up coming appts.  Have discussed with Linell low sodium choices for snacks and meals.  Will continue to visit for Heart failure and medication compliance.   [place] [phone]

## 2020-01-27 ENCOUNTER — Other Ambulatory Visit: Payer: Self-pay

## 2020-01-27 ENCOUNTER — Encounter (INDEPENDENT_AMBULATORY_CARE_PROVIDER_SITE_OTHER): Payer: Self-pay | Admitting: Nurse Practitioner

## 2020-01-27 ENCOUNTER — Ambulatory Visit (INDEPENDENT_AMBULATORY_CARE_PROVIDER_SITE_OTHER): Payer: Medicare Other | Admitting: Nurse Practitioner

## 2020-01-27 ENCOUNTER — Ambulatory Visit (INDEPENDENT_AMBULATORY_CARE_PROVIDER_SITE_OTHER): Payer: Medicare Other

## 2020-01-27 VITALS — BP 100/60 | HR 66 | Resp 15 | Wt 156.0 lb

## 2020-01-27 DIAGNOSIS — I10 Essential (primary) hypertension: Secondary | ICD-10-CM

## 2020-01-27 DIAGNOSIS — I739 Peripheral vascular disease, unspecified: Secondary | ICD-10-CM

## 2020-01-27 DIAGNOSIS — M79604 Pain in right leg: Secondary | ICD-10-CM

## 2020-01-27 DIAGNOSIS — M79605 Pain in left leg: Secondary | ICD-10-CM

## 2020-01-27 DIAGNOSIS — E785 Hyperlipidemia, unspecified: Secondary | ICD-10-CM | POA: Diagnosis not present

## 2020-01-27 NOTE — Progress Notes (Signed)
Subjective:    Patient ID: Carl [name], male    DOB: [birth year], 69 y.o.   MRN: [id number] Chief Complaint  Patient presents with  . Follow-up    ultrasound follow up    The patient is seen for evaluation of painful lower extremities. Patient notes the pain is variable and not always associated with activity.  The pain is somewhat consistent day to day occurring on most days. The patient notes the pain also occurs with standing and routinely seems worse as the day wears on. The pain has been progressive over the past several years. The patient states these symptoms are causing  a profound negative impact on quality of life and daily activities.  The patient notes that he has fractured his left lower extremity on 2 occasions.  The patient denies rest pain or dangling of an extremity off the side of the bed during the night for relief. No open wounds or sores at this time. No history of DVT or phlebitis.  There is a  history of back problems and DJD of the lumbar and sacral spine.   Today the patient has a right ABI of 1.22 and a left ABI 1.26.  He has a TBI on the right of 0.83 and left of 0.92.  This is consistent with the study that was done on 10/15/2019 which showed an ABI of 1.12 on the right and a ABI of 1.23 on the left.  The patient has strong triphasic waveforms in the bilateral tibial arteries with strong toe waveforms bilaterally.   Review of Systems  Musculoskeletal: Positive for back pain, gait problem and myalgias.  Neurological: Positive for weakness.  All other systems reviewed and are negative.      Objective:   Physical Exam Vitals reviewed.  HENT:     Head: Normocephalic.  Cardiovascular:     Rate and Rhythm: Normal rate and regular rhythm.  Pulmonary:     Effort: Pulmonary effort is normal.     Breath sounds: Normal breath sounds.  Skin:    Capillary Refill: Capillary refill takes less than 2 seconds.  Neurological:     Mental Status: He is alert and  oriented to person, place, and time. Mental status is at baseline.     Gait: Gait abnormal.  Psychiatric:        Mood and Affect: Mood normal.        Behavior: Behavior normal.     BP 100/60 (BP Location: Right Arm)   Pulse 66   Resp 15   Wt 156 lb (70.8 kg)   BMI 21.16 kg/m   Past Medical History:  Diagnosis Date  . AICD (automatic cardioverter/defibrillator) present   . Anemia   . Anxiety   . Arthritis   . Brunner's gland hyperplasia of duodenum   . CHF (congestive heart failure) ([place])   . Coronary artery disease   . External hemorrhoids   . Fracture of femoral neck, left ([place]) 07/21/2018  . GERD (gastroesophageal reflux disease)   . Hearing loss   . HH (hiatus hernia)   . Internal hemorrhoids   . Ischemic cardiomyopathy   . Leg fracture, left   . Myocardial infarction ([place])   . Paronychia   . Pneumonia   . Psoriasis   . PUD (peptic ulcer disease)   . Schatzki's ring   . Tubular adenoma of colon   . Vitiligo     Social History   Socioeconomic History  . Marital status: Single  Spouse name: Not on file  . Number of children: Not on file  . Years of education: Not on file  . Highest education level: Not on file  Occupational History  . Not on file  Tobacco Use  . Smoking status: Former Smoker    Quit date: 2014    Years since quitting: 7.4  . Smokeless tobacco: Never Used  . Tobacco comment: 07/30/2017 Quit in 2014  Substance and Sexual Activity  . Alcohol use: No    Alcohol/week: 0.0 standard drinks  . Drug use: No  . Sexual activity: Never  Other Topics Concern  . Not on file  Social History Narrative   Pt lives in [place] with his mother.   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 Difficulty of Paying Living Expenses:   Food Insecurity:   . Worried About Charity fundraiser in the Last Year:   . Arboriculturist in the Last Year:   Transportation Needs:   . Lexicographer (Medical):   Marland Kitchen Lack of Transportation (Non-Medical):   Physical Activity:   . Days of Exercise per Week:   . Minutes of Exercise per Session:   Stress:   . Feeling of Stress :   Social Connections:   . Frequency of Communication with Friends and Family:   . Frequency of Social Gatherings with Friends and Family:   . Attends Religious Services:   . Active Member of Clubs or Organizations:   . Attends Archivist Meetings:   Marland Kitchen Marital Status:   Intimate Partner Violence:   . Fear of Current or Ex-Partner:   . Emotionally Abused:   Marland Kitchen Physically Abused:   . Sexually Abused:     Past Surgical History:  Procedure Laterality Date  . ATRIAL SEPTAL DEFECT(ASD) CLOSURE N/A 12/30/2018   Procedure: ATRIAL SEPTAL DEFECT(ASD) CLOSURE;  Surgeon: [name], MD;  Location: [place] [facility];  Service: Cardiovascular;  Laterality: N/A;  . CARDIAC SURGERY    . CARDIOVERSION N/A 11/09/2019   Procedure: CARDIOVERSION;  Surgeon: [name], MD;  Location: [facility] ENDOSCOPY;  Service: Cardiovascular;  Laterality: N/A;  . CARDIOVERSION N/A 12/21/2019   Procedure: CARDIOVERSION;  Surgeon: [name], MD;  Location: [facility] ENDOSCOPY;  Service: Cardiovascular;  Laterality: N/A;  . COLONOSCOPY WITH ESOPHAGOGASTRODUODENOSCOPY (EGD)    . COLONOSCOPY WITH PROPOFOL N/A 08/24/2017   Procedure: COLONOSCOPY WITH PROPOFOL;  Surgeon: [name], MD;  Location: [facility] ENDOSCOPY;  Service: Endoscopy;  Laterality: N/A;  . CORONARY ARTERY BYPASS GRAFT N/A 08/03/2014   Procedure: CORONARY ARTERY BYPASS GRAFTING (CABG);  Surgeon: [name], MD;  Location: [place];  Service: Open Heart Surgery;  Laterality: N/A;  . EP IMPLANTABLE DEVICE N/A 05/01/2015   MDT ICD implanted for primary prevention of sudden death  . ESOPHAGOGASTRODUODENOSCOPY (EGD) WITH PROPOFOL N/A 04/16/2015   Procedure: ESOPHAGOGASTRODUODENOSCOPY (EGD) WITH PROPOFOL;  Surgeon: [name], MD;  Location: [facility]  ENDOSCOPY;  Service: Endoscopy;  Laterality: N/A;  . ESOPHAGOGASTRODUODENOSCOPY (EGD) WITH PROPOFOL N/A 08/24/2017   Procedure: ESOPHAGOGASTRODUODENOSCOPY (EGD) WITH PROPOFOL;  Surgeon: [name], MD;  Location: [facility] ENDOSCOPY;  Service: Endoscopy;  Laterality: N/A;  . FRACTURE SURGERY    . HERNIA REPAIR    . HIP PINNING,CANNULATED Left 07/22/2018   Procedure: CANNULATED HIP PINNING;  Surgeon: [name], MD;  Location: [place];  Service: Orthopedics;  Laterality: Left;  . MITRAL VALVE  REPAIR N/A 12/30/2018   Procedure: MITRAL VALVE REPAIR;  Surgeon: [name], MD;  Location: [place] [facility];  Service: Cardiovascular;  Laterality: N/A;  . RIGHT/LEFT HEART CATH AND CORONARY/GRAFT ANGIOGRAPHY N/A 12/21/2018   Procedure: RIGHT/LEFT HEART CATH AND CORONARY/GRAFT ANGIOGRAPHY;  Surgeon: [name], MD;  Location: [place] [facility];  Service: Cardiovascular;  Laterality: N/A;  . TEE WITHOUT CARDIOVERSION N/A 08/03/2014   Procedure: TRANSESOPHAGEAL ECHOCARDIOGRAM (TEE);  Surgeon: [name], MD;  Location: [place];  Service: Open Heart Surgery;  Laterality: N/A;  . TEE WITHOUT CARDIOVERSION N/A 02/10/2018   Procedure: TRANSESOPHAGEAL ECHOCARDIOGRAM (TEE);  Surgeon: [name], MD;  Location: [facility] ENDOSCOPY;  Service: Cardiovascular;  Laterality: N/A;  . TEE WITHOUT CARDIOVERSION N/A 11/09/2019   Procedure: TRANSESOPHAGEAL ECHOCARDIOGRAM (TEE);  Surgeon: [name], MD;  Location: [facility] ENDOSCOPY;  Service: Cardiovascular;  Laterality: N/A;    Family History  Problem Relation Age of Onset  . Valvular heart disease Mother        Ruptured valve  . CAD Father   . Heart Problems Brother        Stents x 4  . Diabetes Brother   . Prostate cancer Neg Hx   . Bladder Cancer Neg Hx   . Kidney cancer Neg Hx     Allergies  Allergen Reactions  . Spironolactone Other (See Comments)    gynecomastia        Assessment & Plan:   1. PAD (peripheral artery disease)  ([place]) Patient has minimal evidence of peripheral arterial disease.  As outlined below the patient's lower extremity pain with ambulation is likely not related to vascular issues.  Patient will follow up with primary care for further work-up.  Patient to follow-up on an as-needed basis  2. Essential hypertension Continue antihypertensive medications as already ordered, these medications have been reviewed and there are no changes at this time.   3. Hyperlipidemia, unspecified hyperlipidemia type Continue statin as ordered and reviewed, no changes at this time   4. Lower extremity pain, bilateral Recommend:  I do not find evidence of Vascular pathology that would explain the patient's symptoms  The patient has atypical pain symptoms for vascular disease  I do not find evidence of Vascular pathology that would explain the patient's symptoms and I suspect the patient is c/o pseudoclaudication.  Patient should have an evaluation of his LS spine which I defer to the primary service.  Noninvasive studies including of the legs do not identify vascular problems  The patient should continue walking and begin a more formal exercise program. The patient should continue his antiplatelet therapy and aggressive treatment of the lipid abnormalities. The patient should begin wearing graduated compression socks 15-20 mmHg strength to control her mild edema.  Patient will follow-up with me on a PRN basis  Further work-up of her lower extremity pain is deferred to the primary service      Current Outpatient Medications on File Prior to Visit  Medication Sig Dispense Refill  . acetaminophen (TYLENOL) 500 MG tablet Take 500-1,000 mg by mouth every 6 (six) hours as needed (for pain.).    Marland Kitchen allopurinol (ZYLOPRIM) 100 MG tablet Take 100 mg by mouth daily.    Marland Kitchen amiodarone (PACERONE) 200 MG tablet Take 1 tablet (200 mg total) by mouth daily. 90 tablet 1  . apixaban (ELIQUIS) 5 MG TABS tablet Take 1 tablet  (5 mg total) by mouth 2 (two) times daily. 60 tablet 5  . Ascorbic Acid (VITAMIN  C) 100 MG tablet Take 100 mg by mouth daily.    Marland Kitchen atorvastatin (LIPITOR) 40 MG tablet TAKE 1 TABLET (40 MG TOTAL) BY MOUTH DAILY AT 6 PM. (Patient taking differently: Take 40 mg by mouth at bedtime. ) 90 tablet 3  . carvedilol (COREG) 6.25 MG tablet Take 1 tablet (6.25 mg total) by mouth 2 (two) times daily. 180 tablet 3  . cephALEXin (KEFLEX) 500 MG capsule Take 500 mg by mouth 3 (three) times daily.    . Cholecalciferol (VITAMIN D3 SUPER STRENGTH) 50 MCG (2000 UT) TABS Take 2,000 Units by mouth at bedtime.     . clopidogrel (PLAVIX) 75 MG tablet TAKE 1 TABLET (75 MG TOTAL) BY MOUTH DAILY WITH BREAKFAST.    . cyanocobalamin 500 MCG tablet Take 500 mcg by mouth at bedtime.     . DULoxetine (CYMBALTA) 30 MG capsule Take 30 mg by mouth every evening.     Marland Kitchen eplerenone (INSPRA) 50 MG tablet TAKE 1 TABLET BY MOUTH EVERY DAY (Patient taking differently: Take 50 mg by mouth daily. ) 90 tablet 2  . ferrous sulfate 325 (65 FE) MG tablet Take 1 tablet (325 mg total) by mouth daily with breakfast. (Patient taking differently: Take 325 mg by mouth daily. ) 30 tablet 3  . gabapentin (NEURONTIN) 300 MG capsule Take 300 mg by mouth at bedtime.     Marland Kitchen ketoconazole (NIZORAL) 2 % shampoo Apply 1 application topically 2 (two) times a week.     Marland Kitchen KLOR-CON M20 20 MEQ tablet TAKE 1 TAB BY MOUTH TWICE A DAY. TAKE 1 EXTRA TABLET ON WEDNESDAY MORNING W/ METOLAZONE (Patient taking differently: 20 mEq 2 (two) times daily. He is taking 2 in  am and 1 in pm.  With extra 1 in am on Wednesday with Metolazone.) 180 tablet 3  . losartan (COZAAR) 25 MG tablet Take by mouth.    . metolazone (ZAROXOLYN) 2.5 MG tablet TAKE 1 TAB BY MOUTH ONCE A WEEK. WEDNESDAY MORNINGS. TAKE 1 EXTRA POTASSIUM TABLET WITH THIS (Patient taking differently: Take 2.5 mg by mouth every Wednesday. Morning) 5 tablet 11  . Multiple Vitamin (MULTIVITAMIN) tablet Take 1 tablet by  mouth at bedtime.     . pantoprazole (PROTONIX) 40 MG tablet TAKE 1 TABLET BY MOUTH EVERY DAY 90 tablet 2  . torsemide (DEMADEX) 20 MG tablet Take 3 tablets (60 mg total) by mouth 2 (two) times daily. 540 tablet 0   No current facility-administered medications on file prior to visit.    There are no Patient Instructions on file for this visit. No follow-ups on file.   Kris Hartmann, NP

## 2020-01-31 ENCOUNTER — Ambulatory Visit: Payer: Medicare Other | Attending: Family Medicine | Admitting: Physical Therapy

## 2020-01-31 DIAGNOSIS — R262 Difficulty in walking, not elsewhere classified: Secondary | ICD-10-CM | POA: Insufficient documentation

## 2020-01-31 DIAGNOSIS — R2681 Unsteadiness on feet: Secondary | ICD-10-CM | POA: Insufficient documentation

## 2020-01-31 DIAGNOSIS — R42 Dizziness and giddiness: Secondary | ICD-10-CM | POA: Insufficient documentation

## 2020-01-31 NOTE — Progress Notes (Signed)
Remote ICD transmission.   

## 2020-02-08 ENCOUNTER — Other Ambulatory Visit: Payer: Self-pay

## 2020-02-08 ENCOUNTER — Ambulatory Visit: Payer: Medicare Other | Admitting: Physical Therapy

## 2020-02-08 ENCOUNTER — Encounter: Payer: Self-pay | Admitting: Physical Therapy

## 2020-02-08 VITALS — BP 102/60

## 2020-02-08 DIAGNOSIS — R42 Dizziness and giddiness: Secondary | ICD-10-CM | POA: Diagnosis present

## 2020-02-08 DIAGNOSIS — R2681 Unsteadiness on feet: Secondary | ICD-10-CM | POA: Diagnosis present

## 2020-02-08 DIAGNOSIS — R262 Difficulty in walking, not elsewhere classified: Secondary | ICD-10-CM

## 2020-02-08 NOTE — Therapy (Signed)
[facility] [address] [place], [address] Phone: [phone]   Fax:  [phone]  Physical Therapy Evaluation  Patient Details  Name: Carl [name] MRN: [id number] Date of Birth: 03-11-1951 Referring Provider (PT): Carl Form, NP   Encounter Date: 02/08/2020   PT End of Session - 02/09/20 1155    Visit Number 1    Number of Visits 24    Date for PT Re-Evaluation 05/02/20    Authorization Type [employer] Medicare Reporting period from 02/08/2020    Authorization - Visit Number 1    Authorization - Number of Visits 10    Progress Note Due on Visit 10    PT Start Time 1815    PT Stop Time 1915    PT Time Calculation (min) 60 min    Equipment Utilized During Treatment Gait belt    Activity Tolerance Patient tolerated treatment well    Behavior During Therapy [facility] for tasks assessed/performed           Past Medical History:  Diagnosis Date  . AICD (automatic cardioverter/defibrillator) present   . Anemia   . Anxiety   . Arthritis   . Brunner's gland hyperplasia of duodenum   . CHF (congestive heart failure) ([place])   . Coronary artery disease   . External hemorrhoids   . Fracture of femoral neck, left ([place]) 07/21/2018  . GERD (gastroesophageal reflux disease)   . Hearing loss   . HH (hiatus hernia)   . Internal hemorrhoids   . Ischemic cardiomyopathy   . Leg fracture, left   . Myocardial infarction ([place])   . Paronychia   . Pneumonia   . Psoriasis   . PUD (peptic ulcer disease)   . Schatzki's ring   . Tubular adenoma of colon   . Vitiligo     Past Surgical History:  Procedure Laterality Date  . ATRIAL SEPTAL DEFECT(ASD) CLOSURE N/A 12/30/2018   Procedure: ATRIAL SEPTAL DEFECT(ASD) CLOSURE;  Surgeon: Carl Mocha, MD;  Location: [place] CV LAB;  Service: Cardiovascular;  Laterality: N/A;  . CARDIAC SURGERY    . CARDIOVERSION N/A 11/09/2019   Procedure: CARDIOVERSION;  Surgeon: Carl Dresser, MD;  Location: [facility] ENDOSCOPY;  Service: Cardiovascular;  Laterality: N/A;  . CARDIOVERSION N/A 12/21/2019   Procedure: CARDIOVERSION;  Surgeon: Carl Dresser, MD;  Location: [facility] ENDOSCOPY;  Service: Cardiovascular;  Laterality: N/A;  . COLONOSCOPY WITH ESOPHAGOGASTRODUODENOSCOPY (EGD)    . COLONOSCOPY WITH PROPOFOL N/A 08/24/2017   Procedure: COLONOSCOPY WITH PROPOFOL;  Surgeon: Carl Silvas, MD;  Location: [facility] ENDOSCOPY;  Service: Endoscopy;  Laterality: N/A;  . CORONARY ARTERY BYPASS GRAFT N/A 08/03/2014   Procedure: CORONARY ARTERY BYPASS GRAFTING (CABG);  Surgeon: Carl Pollack, MD;  Location: [place];  Service: Open Heart Surgery;  Laterality: N/A;  . EP IMPLANTABLE DEVICE N/A 05/01/2015   MDT ICD implanted for primary prevention of sudden death  . ESOPHAGOGASTRODUODENOSCOPY (EGD) WITH PROPOFOL N/A 04/16/2015   Procedure: ESOPHAGOGASTRODUODENOSCOPY (EGD) WITH PROPOFOL;  Surgeon: Carl Class, MD;  Location: [facility] ENDOSCOPY;  Service: Endoscopy;  Laterality: N/A;  . ESOPHAGOGASTRODUODENOSCOPY (EGD) WITH PROPOFOL N/A 08/24/2017   Procedure: ESOPHAGOGASTRODUODENOSCOPY (EGD) WITH PROPOFOL;  Surgeon: Carl Silvas, MD;  Location: [facility] ENDOSCOPY;  Service: Endoscopy;  Laterality: N/A;  . FRACTURE SURGERY    . HERNIA REPAIR    . HIP PINNING,CANNULATED Left 07/22/2018   Procedure: CANNULATED HIP PINNING;  Surgeon: Carl Bond, MD;  Location: [facility]  OR;  Service: Orthopedics;  Laterality: Left;  . MITRAL VALVE REPAIR N/A 12/30/2018   Procedure: MITRAL VALVE REPAIR;  Surgeon: Carl Mocha, MD;  Location: [place] CV LAB;  Service: Cardiovascular;  Laterality: N/A;  . RIGHT/LEFT HEART CATH AND CORONARY/GRAFT ANGIOGRAPHY N/A 12/21/2018   Procedure: RIGHT/LEFT HEART CATH AND CORONARY/GRAFT ANGIOGRAPHY;  Surgeon: Carl Mocha, MD;  Location: [place] CV LAB;  Service: Cardiovascular;  Laterality: N/A;  . TEE WITHOUT CARDIOVERSION N/A 08/03/2014   Procedure: TRANSESOPHAGEAL  ECHOCARDIOGRAM (TEE);  Surgeon: Carl Pollack, MD;  Location: [place];  Service: Open Heart Surgery;  Laterality: N/A;  . TEE WITHOUT CARDIOVERSION N/A 02/10/2018   Procedure: TRANSESOPHAGEAL ECHOCARDIOGRAM (TEE);  Surgeon: Carl Dresser, MD;  Location: [facility] ENDOSCOPY;  Service: Cardiovascular;  Laterality: N/A;  . TEE WITHOUT CARDIOVERSION N/A 11/09/2019   Procedure: TRANSESOPHAGEAL ECHOCARDIOGRAM (TEE);  Surgeon: Carl Dresser, MD;  Location: [facility] ENDOSCOPY;  Service: Cardiovascular;  Laterality: N/A;    Vitals:   02/08/20 1837  BP: 102/60      Subjective Assessment - 02/08/20 1837    Subjective Patient is here with his sister, Carl [name], who provides a lot of the history. Patient is a poor historian and neither are forthcoming with comprehensive history, resulting in vague history of present illness after much discussion. Patient reports he had a fall about a month ago up against a building and had trouble with some bleeding and bruising on his elbow. His brother was there with him. He hasn't fallen lately. He started using the single point cane about 3 weeks ago. He has been using a walker for years before that (this timeline or rationale is unclear at first, then noted that he went on disability in 1977 due to L femur fracture and then a subsequent L femur fracture in 1991) . He states he has been off balance a few weeks, but it is unclear if this is new or different from an apparently long history of walker use. His medical team wanted him to start walking with the walker because they said he could not walk without the walker because his legs were weak. He states if he stands up too long he gets dizzy but upon further questioning this seems not to be a spinning sensation, but possibly lightheadedness. Sister reports he has chronically low blood pressure.. He has fallen on the carpet in the bedroom and in the driveway. The walker he used was a RW.  Patient broke his L femur in 1977 (lead to going on  disability) and again in 1991. At some point, he was I with all ADLs and IADLs. Currently, his sister, Carl [name],  takes him out to eat every day, brother comes by frequently, and someone comes by to fix his medications every three weeks. He reports being independent with laundry, washing dishes, and ADLs. Daughter cleans his house. Someone keeps his yard mowed. He sleeps in his bed. Patient states he has a recumbent bike he uses 12 hours a day, but sister states he uses it about 10 min a day.    Patient is accompained by: Family member   Sister, Carl [name]   Pertinent History Patient is a 69 y.o. male who presents to outpatient physical therapy with a referral for medical diagnosis unsteadiness on feet, dizziness and giddiness. This patient's chief complaints consist of feeling unsteady on feet and occasional falling leading to the following functional deficits: difficulty with basic mobility including household and community ambulation, frequent injuries and pain from falling, completing  ADLs, IADLs, safety and quality of life. Relevant past medical history and comorbidities are extensive (see above) with particular relevance of cognitive impairment/decline, repeated L femur fracture, presence of AICD, low blood pressure, former smoker, MI with 5x bipass, ischemic cardiomyopathy, CHF, chronic Afib with recent need for cardioversion, neck pain, leg pain, DVT in left arm (recently cleared), cellulitis of L LE (recently treated).  Patient denies hx of cancer, stroke, seizures, lung problem, diabetes, unexplained weight loss.    Limitations Lifting;Standing;Walking;House hold activities    Diagnostic tests US venous Img Upper Uni Lft report 11/20/2019: "IMPRESSION:Nonocclusive thrombus within the left brachial vein near theantecubital fossa" resolved per note on 5/27/2021CT Lumbar spine report 06/19/2020:"IMPRESSION:1. Healing right sacral ala fracture. This likely contributes to thepatient's right-sided hip pain.2.  Dynamic listhesis with mild subarticular and foraminal stenosisbilaterally at L4-5. The stenosis is likely worse with standing andflexion.3. Mild right foraminal narrowing at L2-3.4. Broad-based disc protrusion at L2-3 and L3-4 extends into theforamina bilaterally both levels without other significant stenosis.5. Moderate left and mild right foraminal stenosis at L5-S1secondary to chronic broad-based disc protrusion and facethypertrophy. Mild left subarticular narrowing is present as well.6.  Aortic Atherosclerosis (ICD10-I70.0)."    Currently in Pain? No/denies    Pain Score 0-No pain    Pain Location Leg    Pain Orientation Right;Left   left femur mostly. also reports some history of pain in the neck and bilateral lower legs (burning)   Aggravating Factors  falling    Pain Relieving Factors not falling              [facility] PT Assessment - 02/09/20 0001      Assessment   Medical Diagnosis unsteadiness on feet, dizziness and giddiness    Referring Provider (PT) Fields, Colan Neptune, NP    Onset Date/Surgical Date --   unclear   Hand Dominance Left    Next MD Visit not sure which day    Prior Therapy two visits at main hospital      Precautions   Precautions Fall;ICD/Pacemaker   no salt     Restrictions   Weight Bearing Restrictions No      Balance Screen   Has the patient fallen in the past 6 months Yes    How many times? 5    Has the patient had a decrease in activity level because of a fear of falling?  No    Is the patient reluctant to leave their home because of a fear of falling?  No      Home Environment   Living Environment Private residence    Living Arrangements Alone    Available Help at Discharge Family;Friend(s);Other (Comment)   has help with yard, cleaning home, medications, meals   Type of [place] to enter    Entrance Stairs-Number of Steps 3    Entrance Stairs-Rails Right;Left;Can reach both    [place] One level    [place]  - single point;Walker - 2 wheels;Other (comment);Shower seat;Grab bars - tub/shower      Prior Function   Level of Independence Needs assistance with homemaking;Needs assistance with ADLs;Requires assistive device for independence;Independent with basic ADLs;Independent with household mobility with device;Other (comment)   used to be able to be completely independent "quite a while    Vocation Retired;On disability    Vocation Requirements used to work at [employer], 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  Leisure used to like to walk his dog      Cognition   Overall Cognitive Status History of cognitive impairments - at baseline    Memory Impaired    Executive Function --   impaired     Observation/Other Assessments   Focus on Therapeutic Outcomes (FOTO)  not appropriate due to cognitive deficits      Berg Balance Test   Sit to Stand Able to stand  independently using hands    Standing Unsupported Able to stand safely 2 minutes    Sitting with Back Unsupported but Feet Supported on Floor or Stool Able to sit safely and securely 2 minutes    Stand to Sit Controls descent by using hands    Transfers Able to transfer safely, minor use of hands    Standing Unsupported with Eyes Closed Able to stand 10 seconds safely    Standing Unsupported with Feet Together Able to place feet together independently and stand 1 minute safely    From Standing, Reach Forward with Outstretched Arm Can reach confidently >25 cm (10")    From Standing Position, Pick up Object from Floor Able to pick up shoe, needs supervision    From Standing Position, Turn to Look Behind Over each Shoulder Looks behind one side only/other side shows less weight shift    Turn 360 Degrees Able to turn 360 degrees safely but slowly    Standing Unsupported, Alternately Place Feet on Step/Stool Able to complete >2 steps/needs minimal assist    Standing Unsupported, One Foot in [employer] balance while stepping or standing    Standing on One Leg Tries to lift leg/unable to hold 3 seconds but remains standing independently    Total Score 40    Berg comment: 37-45 significant fall risk (>80%); Pt uses cane outdoor: 47-49.6 indicates significant to high fall risk   noted for significant imbalance with gait between tasks            OBJECTIVE  OBSERVATION/INSPECTION Posture: WFL . Posture correction:  . Tremor: none . Bed mobility: supine to sit [facility]. . Transfers: sit <> stand independent . Gait: grossly WFL for household and short community ambulation with single point cane except significant unsteadiness noted at times and high level of stumbling, etc when not using AD.    NEUROLOGICAL  Upper Motor Neuron Screen Hoffman's, and Clonus (ankle) negative bilaterally Dermatomes  . L2-S2 appears equal and intact to light touch. Myotomes  . L2-S2 appears intact Deep Tendon Reflexes R/L  . 2+/2+ Quadriceps reflex (L4) . 2+/2+ Achilles reflex (S1)   PERIPHERAL JOINT MOTION (in degrees) Active Assisted Range of Motion (AAROM) Comments: B LE AAROM WFL except some deficits in hindfoot flexibility that could affect balance. Patient unable to relax for full PROM.   MUSCLE PERFORMANCE (MMT):  *Indicates pain 02/08/20 Date Date  Joint/Motion R/L R/L R/L  Hip     Flexion 5/5 / /  Extension (knee ext) / / /  Flexion (knee flex) / / /  Abduction 5/5 / /  Adduction / / /  External rotation / / /  Internal rotation  / / /  Knee     Extension 5/5 / /  Flexion 4+/4 / /  Ankle/Foot     Dorsiflexion  4+/4+ / /  Great toe extension 4+/4+ / /  Eversion 5/5 / /  Comments:   FUNCTIONAL/BALANCE TESTS: Five Time Sit to Stand (5TSTS): 17 seconds from 18.5 inch plinth, initial hand use.  BERG Balance Scale: 40/56 (significant fall risk, see above for details)  EDUCATION/COGNITION: Patient is alert and oriented X 4.   Objective measurements completed on examination:  See above findings.      PT Education - 02/09/20 1154    Education Details Education on diagnosis, prognosis, POC, anatomy and physiology of current condition    Person(s) Educated Patient;Other (comment)   Sister   Methods Explanation;Demonstration;Tactile cues;Verbal cues    Comprehension Verbalized understanding;Returned demonstration;Verbal cues required;Tactile cues required;Need further instruction            PT Short Term Goals - 02/09/20 1208      PT SHORT TERM GOAL #1   Title Patient/family indepndent with initial home exercise program for self-management of symptoms.    Baseline to be established as safe and appropriate visit 2 (02/08/2020);    Time 2    Period Weeks    Status New    Target Date 02/23/20             PT Long Term Goals - 02/09/20 1209      PT LONG TERM GOAL #1   Title Family/patient is independent with a long-term home exercise program for self-management of symptoms.    Baseline to be established as safe and appropriate visit 2 (02/08/2020);    Time 12    Period Weeks    Status New    Target Date --   TARGET DATE FOR ALL LONG TERM GOALS: 05/02/2020     PT LONG TERM GOAL #2   Title Patient will score equal or more than 25/30 on Functional Gait Assessment to demonstrate low fall risk.    Baseline to be tested visit 2 (02/08/2020);    Time 12    Period Weeks    Status New      PT LONG TERM GOAL #3   Title Patient will demonstrate the ability to complete sequential tapping steps on 8 inch step independently to demonstrate improved ability to navigate around objects without falling.    Baseline required support and repeated instructions (02/08/2020);    Time 12    Period Weeks    Status New      PT LONG TERM GOAL #4   Title Patient will demonstrated ability to complete Five Time Sit to Stand Test in equal or less than 12 seconds from 18.5 inch surface without UE support to demonstrate improved balance and safety with transfers.    Baseline 17  seconds from 18.5 inch plinth, initial hand use. . (02/08/2020);    Time 12    Period Weeks    Status New      PT LONG TERM GOAL #5   Title Patient will report less than 1 fall over 8 weeks to demonstrate improved safety with functional mobility.    Baseline at least 5 falls in the last 6 months (02/08/2020);    Time 12    Period Weeks    Status New                  Plan - 02/09/20 1156    Clinical Impression Statement Patient is a 69 y.o. male referred to outpatient physical therapy with a medical diagnosis of unsteadiness on feet, dizziness and giddiness who presents with signs and symptoms consistent with unsteadiness on feet and disequilibrium, difficulty walking. Patient presents with significant balance, pain, and cognitive  impairments that are limiting ability to complete basic mobility including household and community ambulation, frequent injuries and pain from falling,  completing ADLs, IADLs, safety home alone without difficulty and decreased quality of life. Patient scored 40/56 (significant risk for falls) on the BERG balance test. He scored very well on the static standing and transfer tasks in the BERG but had great difficulty following instructions and completing single leg tasks at the end and walking in the clinic. Would benefit from further assessment of vestibular function and balance during gait. Also demonstrated very low blood pressure seated at rest and would benefit from further assessment of orthostatic vitals. Balance deficits likely closely linked to cognitive decline based on overall presentation. Patient did show good use of SPC. Patient will benefit from skilled physical therapy intervention to address current body structure impairments and activity limitations to improve function and work towards goals set in current POC in order to return to prior level of function or maximal functional improvement.    Personal Factors and Comorbidities Age;Time since onset of  injury/illness/exacerbation;Behavior Pattern;Comorbidity 3+;Education;Social Background;Past/Current Experience;Fitness;Other   Cognition   Comorbidities Relevant past medical history and comorbidities are extensive (see above) with particular relevance of cognitive impairment/decline, repeated L femur fracture, presence of AICD, low blood pressure, former smoker, MI with 5x bipass, ischemic cardiomyopathy, CHF, chronic Afib with recent need for cardioversion, neck pain, leg pain, DVT in left arm (recently cleared), cellulitis of L LE (recently treated).    Examination-Activity Limitations Carry;Stairs;Sit;Reach Overhead;Locomotion Level;Stand;Lift;Dressing;Bend;Transfers;Other   basic household and community ambulatoin   Examination-Participation Restrictions Community Activity;Cleaning;Meal Prep;Medication Management;Driving;Shop;Yard Work    Stability/Clinical Decision Making Evolving/Moderate complexity    Rehab Potential Fair    PT Frequency 2x / week    PT Duration 12 weeks    PT Treatment/Interventions ADLs/Self Care Home Management;Cryotherapy;Canalith Repostioning;Moist Heat;Gait training;Stair training;Functional mobility training;Therapeutic activities;Therapeutic exercise;Balance training;Neuromuscular re-education;Cognitive remediation;Patient/family education;Manual techniques;Dry needling;Vestibular;Visual/perceptual remediation/compensation;Joint Manipulations;Spinal Manipulations    PT Next Visit Plan provide HEP, assess FGA, orthostatic vitals    PT Home Exercise Plan to be established as appropriate based on cognition and safety    Consulted and Agree with Plan of Care Patient;Family member/caregiver    Family Member Consulted sister, Carl [name]           Patient will benefit from skilled therapeutic intervention in order to improve the following deficits and impairments:  Abnormal gait, Decreased cognition, Decreased knowledge of use of DME, Dizziness, Improper body mechanics, Pain,  Impaired tone, Decreased coordination, Decreased mobility, Decreased activity tolerance, Decreased endurance, Difficulty walking, Decreased safety awareness, Decreased knowledge of precautions, Decreased balance  Visit Diagnosis: Difficulty in walking, not elsewhere classified  Unsteadiness on feet  Dizziness and giddiness     Problem List Patient Active Problem List   Diagnosis Date Noted  . Persistent atrial fibrillation ([place]) 11/30/2019  . Secondary hypercoagulable state ([place]) 11/30/2019  . Arm DVT (deep venous thromboembolism), acute, left ([place]) 11/26/2019  . Hx of adenomatous colonic polyps 05/18/2019  . Cirrhosis, non-alcoholic ([place]) 84/13/2440  . Occult blood positive stool 05/12/2019  . HCAP (healthcare-associated pneumonia) 03/04/2019  . Hallucinations 02/10/2019  . Elevated blood sugar 01/27/2019  . S/P mitral valve repair 12/31/2018  . Bilateral pulmonary contusion 10/17/2018  . Cardiac LV ejection fraction of 20-34% 10/17/2018  . Chronic systolic CHF (congestive heart failure) ([place]) 10/17/2018  . History of ischemic cardiomyopathy 10/17/2018  . ICD (implantable cardioverter-defibrillator) in place 10/17/2018  . S/p left hip fracture 10/17/2018  . Traumatic retroperitoneal hematoma 10/17/2018  . Age-related osteoporosis with current pathological fracture with routine healing 07/28/2018  . CAD (coronary artery disease) 07/21/2018  . GERD (gastroesophageal  reflux disease) 07/21/2018  . Depression 07/21/2018  . CKD (chronic kidney disease), stage III 07/21/2018  . Iron deficiency anemia 07/21/2018  . Hypokalemia 07/21/2018  . Closed left hip fracture ([place]) 07/21/2018  . Fracture of femoral neck, left ([place]) 07/21/2018  . Lower extremity pain, bilateral 11/12/2017  . 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 Hyperlipidemia 09/01/2017  . Essential hypertension 09/01/2017  . PAD (peripheral artery disease) ([place]) 09/01/2017  . Anemia, unspecified  08/06/2017  . GAD (generalized anxiety disorder) 06/18/2017  . Prostate cancer screening 04/28/2017  . Hydrocele 04/28/2017  . Bradycardia   . Premature ventricular contraction   . Severe mitral insufficiency   . Cardiomyopathy, ischemic 04/26/2015  . Difficulty hearing 12/29/2014  . Acute on chronic systolic CHF (congestive heart failure) ([place]) 10/10/2014  . 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 Protein-calorie malnutrition, severe ([place]) 07/29/2014  . AKI (acute kidney injury) ([place]) 07/29/2014    Everlean Alstrom. Graylon Good, PT, DPT 02/09/20, 12:18 PM  [facility] [address], [place], [address] Phone: [phone]   Fax:  [phone]  Name: Carl [name] MRN: [id number] Date of Birth: Jul 20, 1951

## 2020-02-13 ENCOUNTER — Other Ambulatory Visit: Payer: Self-pay

## 2020-02-13 ENCOUNTER — Other Ambulatory Visit (HOSPITAL_COMMUNITY): Payer: Self-pay | Admitting: *Deleted

## 2020-02-13 ENCOUNTER — Ambulatory Visit: Payer: Medicare Other | Admitting: Physical Therapy

## 2020-02-13 ENCOUNTER — Encounter (HOSPITAL_COMMUNITY): Payer: Self-pay

## 2020-02-13 ENCOUNTER — Other Ambulatory Visit (HOSPITAL_COMMUNITY): Payer: Self-pay

## 2020-02-13 ENCOUNTER — Encounter: Payer: Self-pay | Admitting: Physical Therapy

## 2020-02-13 DIAGNOSIS — R42 Dizziness and giddiness: Secondary | ICD-10-CM

## 2020-02-13 DIAGNOSIS — R262 Difficulty in walking, not elsewhere classified: Secondary | ICD-10-CM

## 2020-02-13 DIAGNOSIS — R2681 Unsteadiness on feet: Secondary | ICD-10-CM

## 2020-02-13 MED ORDER — POTASSIUM CHLORIDE CRYS ER 20 MEQ PO TBCR: EXTENDED_RELEASE_TABLET | ORAL | 3 refills | Status: DC

## 2020-02-13 NOTE — Therapy (Signed)
[facility] [address] [place], [address] Phone: [phone]   Fax:  [phone]  Physical Therapy Treatment  Patient Details  Name: Carl [name] MRN: [id number] Date of Birth: 01-22-1951 Referring Provider (PT): [name], NP   Encounter Date: 02/13/2020   PT End of Session - 02/13/20 1136    Visit Number 2    Number of Visits 24    Date for PT Re-Evaluation 05/02/20    Authorization Type [employer] Medicare Reporting period from 02/08/2020    Authorization - Visit Number 2    Authorization - Number of Visits 10    Progress Note Due on Visit 10    PT Start Time 1035    PT Stop Time 1115    PT Time Calculation (min) 40 min    Equipment Utilized During Treatment Gait belt    Activity Tolerance Patient tolerated treatment well    Behavior During Therapy [facility] for tasks assessed/performed           Past Medical History:  Diagnosis Date  . AICD (automatic cardioverter/defibrillator) present   . Anemia   . Anxiety   . Arthritis   . Brunner's gland hyperplasia of duodenum   . CHF (congestive heart failure) ([place])   . Coronary artery disease   . External hemorrhoids   . Fracture of femoral neck, left ([place]) 07/21/2018  . GERD (gastroesophageal reflux disease)   . Hearing loss   . HH (hiatus hernia)   . Internal hemorrhoids   . Ischemic cardiomyopathy   . Leg fracture, left   . Myocardial infarction ([place])   . Paronychia   . Pneumonia   . Psoriasis   . PUD (peptic ulcer disease)   . Schatzki's ring   . Tubular adenoma of colon   . Vitiligo     Past Surgical History:  Procedure Laterality Date  . ATRIAL SEPTAL DEFECT(ASD) CLOSURE N/A 12/30/2018   Procedure: ATRIAL SEPTAL DEFECT(ASD) CLOSURE;  Surgeon: [name], MD;  Location: [place] [facility];  Service: Cardiovascular;  Laterality: N/A;  . CARDIAC SURGERY    . CARDIOVERSION N/A 11/09/2019   Procedure: CARDIOVERSION;  Surgeon: [name], MD;  Location: [facility] ENDOSCOPY;  Service: Cardiovascular;  Laterality: N/A;  . CARDIOVERSION N/A 12/21/2019   Procedure: CARDIOVERSION;  Surgeon: [name], MD;  Location: [facility] ENDOSCOPY;  Service: Cardiovascular;  Laterality: N/A;  . COLONOSCOPY WITH ESOPHAGOGASTRODUODENOSCOPY (EGD)    . COLONOSCOPY WITH PROPOFOL N/A 08/24/2017   Procedure: COLONOSCOPY WITH PROPOFOL;  Surgeon: [name], MD;  Location: [facility] ENDOSCOPY;  Service: Endoscopy;  Laterality: N/A;  . CORONARY ARTERY BYPASS GRAFT N/A 08/03/2014   Procedure: CORONARY ARTERY BYPASS GRAFTING (CABG);  Surgeon: [name], MD;  Location: [place];  Service: Open Heart Surgery;  Laterality: N/A;  . EP IMPLANTABLE DEVICE N/A 05/01/2015   MDT ICD implanted for primary prevention of sudden death  . ESOPHAGOGASTRODUODENOSCOPY (EGD) WITH PROPOFOL N/A 04/16/2015   Procedure: ESOPHAGOGASTRODUODENOSCOPY (EGD) WITH PROPOFOL;  Surgeon: [name], MD;  Location: [facility] ENDOSCOPY;  Service: Endoscopy;  Laterality: N/A;  . ESOPHAGOGASTRODUODENOSCOPY (EGD) WITH PROPOFOL N/A 08/24/2017   Procedure: ESOPHAGOGASTRODUODENOSCOPY (EGD) WITH PROPOFOL;  Surgeon: [name], MD;  Location: [facility] ENDOSCOPY;  Service: Endoscopy;  Laterality: N/A;  . FRACTURE SURGERY    . HERNIA REPAIR    . HIP PINNING,CANNULATED Left 07/22/2018   Procedure: CANNULATED HIP PINNING;  Surgeon: [name], MD;  Location: [facility]  OR;  Service: Orthopedics;  Laterality: Left;  . MITRAL VALVE REPAIR N/A 12/30/2018   Procedure: MITRAL VALVE REPAIR;  Surgeon: [name], MD;  Location: [place] CV [facility];  Service: Cardiovascular;  Laterality: N/A;  . RIGHT/LEFT HEART CATH AND CORONARY/GRAFT ANGIOGRAPHY N/A 12/21/2018   Procedure: RIGHT/LEFT HEART CATH AND CORONARY/GRAFT ANGIOGRAPHY;  Surgeon: [name], MD;  Location: [place] [facility];  Service: Cardiovascular;  Laterality: N/A;  . TEE WITHOUT CARDIOVERSION N/A 08/03/2014   Procedure: TRANSESOPHAGEAL  ECHOCARDIOGRAM (TEE);  Surgeon: [name], MD;  Location: [place];  Service: Open Heart Surgery;  Laterality: N/A;  . TEE WITHOUT CARDIOVERSION N/A 02/10/2018   Procedure: TRANSESOPHAGEAL ECHOCARDIOGRAM (TEE);  Surgeon: [name], MD;  Location: [facility] ENDOSCOPY;  Service: Cardiovascular;  Laterality: N/A;  . TEE WITHOUT CARDIOVERSION N/A 11/09/2019   Procedure: TRANSESOPHAGEAL ECHOCARDIOGRAM (TEE);  Surgeon: [name], MD;  Location: [facility] ENDOSCOPY;  Service: Cardiovascular;  Laterality: N/A;    Vitals:   02/13/20 1044 02/13/20 1046 02/13/20 1048 02/13/20 1049  SpO2: 97% 97% 97% 98%     Subjective Assessment - 02/13/20 1040    Subjective Patient reports he is bothered by itchiness and burning in his lower legs, R > L, that is worst at night. He would like to know what causes it. Demonstrated dry skin with rash at anterior lower leg R > L. Reports no falls since last time he was here. Walks with his cane that "helps me a whole lot."    Patient is accompained by: Family member   Sister, [name]   Pertinent History Patient is a 69 y.o. male who presents to outpatient physical therapy with a referral for medical diagnosis unsteadiness on feet, dizziness and giddiness. This patient's chief complaints consist of feeling unsteady on feet and occasional falling leading to the following functional deficits: difficulty with basic mobility including household and community ambulation, frequent injuries and pain from falling, completing ADLs, IADLs, safety and quality of life. Relevant past medical history and comorbidities are extensive (see above) with particular relevance of cognitive impairment/decline, repeated L femur fracture, presence of AICD, low blood pressure, former smoker, MI with 5x bipass, ischemic cardiomyopathy, CHF, chronic Afib with recent need for cardioversion, neck pain, leg pain, DVT in left arm (recently cleared), cellulitis of L LE (recently treated).  Patient denies hx of  cancer, stroke, seizures, lung problem, diabetes, unexplained weight loss.    Limitations Lifting;Standing;Walking;House hold activities    Diagnostic tests US venous Img Upper Uni Lft report 11/20/2019: "IMPRESSION:Nonocclusive thrombus within the left brachial vein near theantecubital fossa" resolved per note on 5/27/2021CT Lumbar spine report 06/19/2020:"IMPRESSION:1. Healing right sacral ala fracture. This likely contributes to thepatient's right-sided hip pain.2. Dynamic listhesis with mild subarticular and foraminal stenosisbilaterally at L4-5. The stenosis is likely worse with standing andflexion.3. Mild right foraminal narrowing at L2-3.4. Broad-based disc protrusion at L2-3 and L3-4 extends into theforamina bilaterally both levels without other significant stenosis.5. Moderate left and mild right foraminal stenosis at L5-S1secondary to chronic broad-based disc protrusion and facethypertrophy. Mild left subarticular narrowing is present as well.6.  Aortic Atherosclerosis (ICD10-I70.0)."    Currently in Pain? No/denies   no pain currently             [facility] PT Assessment - 02/13/20 0001      Assessment   Medical Diagnosis unsteadiness on feet, dizziness and giddiness    Referring Provider (PT) Fields, Colan Neptune, NP    Onset Date/Surgical Date --   unclear  Hand Dominance Left    Next MD Visit not sure which day    Prior Therapy two visits at main hospital      Precautions   Precautions Fall;ICD/Pacemaker   no salt     Restrictions   Weight Bearing Restrictions No      Balance Screen   Has the patient fallen in the past 6 months Yes    How many times? 5    Has the patient had a decrease in activity level because of a fear of falling?  No    Is the patient reluctant to leave their home because of a fear of falling?  No      Home Environment   Living Environment Private residence    Living Arrangements Alone    Available Help at Discharge Family;Friend(s);Other (Comment)   has  help with yard, cleaning home, medications, meals   Type of [place] to enter    Entrance Stairs-Number of Steps 3    Entrance Stairs-Rails Right;Left;Can reach both    [place] One level    [place] - single point;Walker - 2 wheels;Other (comment);Shower seat;Grab bars - tub/shower      Prior Function   Level of Independence Needs assistance with homemaking;Needs assistance with ADLs;Requires assistive device for independence;Independent with basic ADLs;Independent with household mobility with device;Other (comment)   used to be able to be completely independent "quite a while    Vocation Retired;On disability    Vocation Requirements used to work at [employer], 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     Leisure used to like to walk his dog      Cognition   Overall Cognitive Status History of cognitive impairments - at baseline    Memory Impaired    Executive Function --   impaired     Observation/Other Assessments   Focus on Therapeutic Outcomes (FOTO)  not appropriate due to cognitive deficits      Berg Balance Test   Sit to Stand --    Standing Unsupported --    Sitting with Back Unsupported but Feet Supported on Floor or Stool --    Stand to Sit --    Transfers --    Standing Unsupported with Eyes Closed --    Standing Unsupported with Feet Together --    From Standing, Reach Forward with Outstretched Arm --    From Standing Position, Pick up Object from Floor --    From Standing Position, Turn to Look Behind Over each Shoulder --    Turn 360 Degrees --    Standing Unsupported, Alternately Place Feet on Step/Stool --    Standing Unsupported, One Foot in Front --    Standing on One Leg --    Total Score --    Merrilee Jansky comment: --   noted for significant imbalance with gait between tasks      Functional Gait  Assessment   Gait Level Surface Walks 20 ft in less than 7 sec but greater than 5.5  sec, uses assistive device, slower speed, mild gait deviations, or deviates 6-10 in outside of the 12 in walkway width.   7.61 sec   Change in Gait Speed Able to change speed, demonstrates mild gait deviations, deviates 6-10 in outside of the 12 in walkway width, or no gait deviations, unable to achieve a major change in velocity, or uses a change in velocity, or uses  an assistive device.    Gait with Horizontal Head Turns Performs head turns smoothly with slight change in gait velocity (eg, minor disruption to smooth gait path), deviates 6-10 in outside 12 in walkway width, or uses an assistive device.    Gait with Vertical Head Turns Performs task with slight change in gait velocity (eg, minor disruption to smooth gait path), deviates 6 - 10 in outside 12 in walkway width or uses assistive device    Gait and Pivot Turn Turns slowly, requires verbal cueing, or requires several small steps to catch balance following turn and stop    Step Over Obstacle Is able to step over one shoe box (4.5 in total height) but must slow down and adjust steps to clear box safely. May require verbal cueing.   required two attempts due to not stepping over, used [facility]   Gait with Narrow Base of Support Ambulates less than 4 steps heel to toe or cannot perform without assistance.   toes angled, steady x9 steps arms across chest   Gait with Eyes Closed Walks 20 ft, uses assistive device, slower speed, mild gait deviations, deviates 6-10 in outside 12 in walkway width. Ambulates 20 ft in less than 9 sec but greater than 7 sec.    Ambulating Backwards Walks 20 ft, slow speed, abnormal gait pattern, evidence for imbalance, deviates 10-15 in outside 12 in walkway width.    Steps Two feet to a stair, must use rail.    Total Score 14    FGA comment: < 19 = high risk fall              TREATMENT:  Recent  DVT in left elbow region High fall risk! Cognitive deficits  Therapeutic exercise: to centralize symptoms and improve  ROM, strength, muscular endurance, and activity tolerance required for successful completion of functional activities.  - orthostatic vitals measurements to assess whether imbalance and unsteadiness on feet may be related. Patient's blood pressure is overall somewhat low, but was negative for OH (see above). Completed after 5 minutes of quite rest in supine to get accurate reading.  - Functional Gait Assessment to further assess balance during gait (scored high fall risk, see above).  - seated toe taps on cone: x 10 with B UE support and x 10 without B UE support. Consider adding to HEP in the future. Would likely require assistance from family member (not presesnt today) - ambulation with SPC and close SBA ~ 100 feet to look for sister who is picking him up outside. She had not arrived so returned to waiting room in the line of sight of front office staff to monitor until she arrives.   Pt required multimodal cuing for proper technique and to facilitate improved neuromuscular control, strength, range of motion, and functional ability resulting in improved performance and form.     PT Short Term Goals - 02/09/20 1208      PT SHORT TERM GOAL #1   Title Patient/family indepndent with initial home exercise program for self-management of symptoms.    Baseline to be established as safe and appropriate visit 2 (02/08/2020);    Time 2    Period Weeks    Status New    Target Date 02/23/20             PT Long Term Goals - 02/09/20 1209      PT LONG TERM GOAL #1   Title Family/patient is independent with a long-term home exercise program for self-management  of symptoms.    Baseline to be established as safe and appropriate visit 2 (02/08/2020);    Time 12    Period Weeks    Status New    Target Date --   TARGET DATE FOR ALL LONG TERM GOALS: 05/02/2020     PT LONG TERM GOAL #2   Title Patient will score equal or more than 25/30 on Functional Gait Assessment to demonstrate low fall risk.     Baseline to be tested visit 2 (02/08/2020);    Time 12    Period Weeks    Status New      PT LONG TERM GOAL #3   Title Patient will demonstrate the ability to complete sequential tapping steps on 8 inch step independently to demonstrate improved ability to navigate around objects without falling.    Baseline required support and repeated instructions (02/08/2020);    Time 12    Period Weeks    Status New      PT LONG TERM GOAL #4   Title Patient will demonstrated ability to complete Five Time Sit to Stand Test in equal or less than 12 seconds from 18.5 inch surface without UE support to demonstrate improved balance and safety with transfers.    Baseline 17 seconds from 18.5 inch plinth, initial hand use. . (02/08/2020);    Time 12    Period Weeks    Status New      PT LONG TERM GOAL #5   Title Patient will report less than 1 fall over 8 weeks to demonstrate improved safety with functional mobility.    Baseline at least 5 falls in the last 6 months (02/08/2020);    Time 12    Period Weeks    Status New                 Plan - 02/13/20 1132    Clinical Impression Statement Patient tolerated treatment well overall but continues to demonstrate significant cognitive deficit with need for repeated cuing to perform activities correctly and to stay on task. Started to explore options for safe HEP but did not provide instructions for at home due to lack of caregiver presence and that patient does not seem able to perform safely independently due to cognitive deficit. CGA to SBA guarding. Patient was negative for [facility] but scored high fall risk on FGA. Did not complain of spinning sensation or complain of any extreme increase in imbalance with exercises involving head movements that decreases likelihood of vestibular disorder affecting his balance. Gave patient note instructing him to contact PCP about itchy rash on lower legs to help answer the questions he had. Patient would benefit from  continued management of limiting condition by skilled physical therapist to address remaining impairments and functional limitations to work towards stated goals and return to PLOF or maximal functional independence.    Personal Factors and Comorbidities Age;Time since onset of injury/illness/exacerbation;Behavior Pattern;Comorbidity 3+;Education;Social Background;Past/Current Experience;Fitness;Other   Cognition   Comorbidities Relevant past medical history and comorbidities are extensive (see above) with particular relevance of cognitive impairment/decline, repeated L femur fracture, presence of AICD, low blood pressure, former smoker, MI with 5x bipass, ischemic cardiomyopathy, CHF, chronic Afib with recent need for cardioversion, neck pain, leg pain, DVT in left arm (recently cleared), cellulitis of L LE (recently treated).    Examination-Activity Limitations Carry;Stairs;Sit;Reach Overhead;Locomotion Level;Stand;Lift;Dressing;Bend;Transfers;Other   basic household and community ambulatoin   Examination-Participation Restrictions Community Activity;Cleaning;Meal Prep;Medication Management;Driving;Shop;Yard Work    Stability/Clinical Decision Making Evolving/Moderate complexity  Rehab Potential Fair    PT Frequency 2x / week    PT Duration 12 weeks    PT Treatment/Interventions ADLs/Self Care Home Management;Cryotherapy;Canalith Repostioning;Moist Heat;Gait training;Stair training;Functional mobility training;Therapeutic activities;Therapeutic exercise;Balance training;Neuromuscular re-education;Cognitive remediation;Patient/family education;Manual techniques;Dry needling;Vestibular;Visual/perceptual remediation/compensation;Joint Manipulations;Spinal Manipulations    PT Next Visit Plan provide HEP, assess FGA, orthostatic vitals    PT Home Exercise Plan to be established as appropriate based on cognition and safety    Consulted and Agree with Plan of Care Patient    Family Member Consulted --            Patient will benefit from skilled therapeutic intervention in order to improve the following deficits and impairments:  Abnormal gait, Decreased cognition, Decreased knowledge of use of DME, Dizziness, Improper body mechanics, Pain, Impaired tone, Decreased coordination, Decreased mobility, Decreased activity tolerance, Decreased endurance, Difficulty walking, Decreased safety awareness, Decreased knowledge of precautions, Decreased balance  Visit Diagnosis: Difficulty in walking, not elsewhere classified  Unsteadiness on feet  Dizziness and giddiness     Problem List Patient Active Problem List   Diagnosis Date Noted  . Persistent atrial fibrillation ([place]) 11/30/2019  . Secondary hypercoagulable state ([place]) 11/30/2019  . Arm DVT (deep venous thromboembolism), acute, left ([place]) 11/26/2019  . Hx of adenomatous colonic polyps 05/18/2019  . Cirrhosis, non-alcoholic ([place]) 16/05/9603  . Occult blood positive stool 05/12/2019  . HCAP (healthcare-associated pneumonia) 03/04/2019  . Hallucinations 02/10/2019  . Elevated blood sugar 01/27/2019  . S/P mitral valve repair 12/31/2018  . Bilateral pulmonary contusion 10/17/2018  . Cardiac LV ejection fraction of 20-34% 10/17/2018  . Chronic systolic CHF (congestive heart failure) ([place]) 10/17/2018  . History of ischemic cardiomyopathy 10/17/2018  . ICD (implantable cardioverter-defibrillator) in place 10/17/2018  . S/p left hip fracture 10/17/2018  . Traumatic retroperitoneal hematoma 10/17/2018  . Age-related osteoporosis with current pathological fracture with routine healing 07/28/2018  . CAD (coronary artery disease) 07/21/2018  . GERD (gastroesophageal reflux disease) 07/21/2018  . Depression 07/21/2018  . CKD (chronic kidney disease), stage III 07/21/2018  . Iron deficiency anemia 07/21/2018  . Hypokalemia 07/21/2018  . Closed left hip fracture ([place]) 07/21/2018  . Fracture of femoral neck, left ([place]) 07/21/2018  . Lower  extremity pain, bilateral 11/12/2017  . 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 Hyperlipidemia 09/01/2017  . Essential hypertension 09/01/2017  . PAD (peripheral artery disease) ([place]) 09/01/2017  . Anemia, unspecified 08/06/2017  . GAD (generalized anxiety disorder) 06/18/2017  . Prostate cancer screening 04/28/2017  . Hydrocele 04/28/2017  . Bradycardia   . Premature ventricular contraction   . Severe mitral insufficiency   . Cardiomyopathy, ischemic 04/26/2015  . Difficulty hearing 12/29/2014  . Acute on chronic systolic CHF (congestive heart failure) ([place]) 10/10/2014  . 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 Protein-calorie malnutrition, severe ([place]) 07/29/2014  . AKI (acute kidney injury) ([place]) 07/29/2014    Everlean Alstrom. Graylon Good, PT, DPT 02/13/20, 11:36 AM  [facility] [address] [place], [address] Phone: [phone]   Fax:  [phone]  Name: LAMARIO MANI MRN: [id number] Date of Birth: [birth year]

## 2020-02-13 NOTE — Progress Notes (Signed)
Today was a home visit with [name].  Today he states his legs are feeling better since started physical therapy.  He has all his medications, refilled his med boxes.  He had missed 1 eliquis and 1 carvedilol during last week.  Showed it to him, he had no comment.  Called in refills for potassium, pantoprazole and eliquis.  Will call [place] [facility] and advised [place] needs new refill on potassium.  His legs has no swelling, he states abdomen is not tight.  He states appetite is good.  His sister [name] still takes him out to eat often.  His weight has been between 163 and 154 lbs in past 2 weeks.  Lungs are clear. He denies chest pain or shortness of breath.  He is ambulating around the house good today.  His Brother [name] is aware of his appts.  His sister is aware of his PT appts.  He has everything for everyday living.  He lives alone with a lot of support from brother and sister.  Will continue to visit for Heart Failure.   [place] [phone]

## 2020-02-14 ENCOUNTER — Encounter: Payer: Medicare Other | Admitting: Physical Therapy

## 2020-02-15 ENCOUNTER — Other Ambulatory Visit: Payer: Self-pay

## 2020-02-15 ENCOUNTER — Encounter: Payer: Self-pay | Admitting: Physical Therapy

## 2020-02-15 ENCOUNTER — Ambulatory Visit: Payer: Medicare Other | Admitting: Physical Therapy

## 2020-02-15 DIAGNOSIS — R262 Difficulty in walking, not elsewhere classified: Secondary | ICD-10-CM | POA: Diagnosis not present

## 2020-02-15 DIAGNOSIS — R2681 Unsteadiness on feet: Secondary | ICD-10-CM

## 2020-02-15 DIAGNOSIS — R42 Dizziness and giddiness: Secondary | ICD-10-CM

## 2020-02-15 NOTE — Therapy (Signed)
[facility] [address], [place], [address] Phone: [phone]   Fax:  [phone]  Physical Therapy Treatment  Patient Details  Name: Carl [name] MRN: [id number] Date of Birth: 1950-11-08 Referring Provider (PT): [name], NP   Encounter Date: 02/15/2020   PT End of Session - 02/15/20 1130    Visit Number 3    Number of Visits 24    Date for PT Re-Evaluation 05/02/20    Authorization Type [employer] Medicare Reporting period from 02/08/2020    Authorization - Visit Number 3    Authorization - Number of Visits 10    Progress Note Due on Visit 10    PT Start Time 4196    PT Stop Time 1115    PT Time Calculation (min) 40 min    Equipment Utilized During Treatment Gait belt    Activity Tolerance Patient tolerated treatment well    Behavior During Therapy [facility] for tasks assessed/performed           Past Medical History:  Diagnosis Date  . AICD (automatic cardioverter/defibrillator) present   . Anemia   . Anxiety   . Arthritis   . Brunner's gland hyperplasia of duodenum   . CHF (congestive heart failure) ([place])   . Coronary artery disease   . External hemorrhoids   . Fracture of femoral neck, left ([place]) 07/21/2018  . GERD (gastroesophageal reflux disease)   . Hearing loss   . HH (hiatus hernia)   . Internal hemorrhoids   . Ischemic cardiomyopathy   . Leg fracture, left   . Myocardial infarction ([place])   . Paronychia   . Pneumonia   . Psoriasis   . PUD (peptic ulcer disease)   . Schatzki's ring   . Tubular adenoma of colon   . Vitiligo     Past Surgical History:  Procedure Laterality Date  . ATRIAL SEPTAL DEFECT(ASD) CLOSURE N/A 12/30/2018   Procedure: ATRIAL SEPTAL DEFECT(ASD) CLOSURE;  Surgeon: [name], MD;  Location: [place] [facility];  Service: Cardiovascular;  Laterality: N/A;  . CARDIAC SURGERY    . CARDIOVERSION N/A 11/09/2019   Procedure: CARDIOVERSION;  Surgeon: [name], MD;  Location: [facility] ENDOSCOPY;  Service: Cardiovascular;  Laterality: N/A;  . CARDIOVERSION N/A 12/21/2019   Procedure: CARDIOVERSION;  Surgeon: [name], MD;  Location: [facility] ENDOSCOPY;  Service: Cardiovascular;  Laterality: N/A;  . COLONOSCOPY WITH ESOPHAGOGASTRODUODENOSCOPY (EGD)    . COLONOSCOPY WITH PROPOFOL N/A 08/24/2017   Procedure: COLONOSCOPY WITH PROPOFOL;  Surgeon: [name], MD;  Location: [facility] ENDOSCOPY;  Service: Endoscopy;  Laterality: N/A;  . CORONARY ARTERY BYPASS GRAFT N/A 08/03/2014   Procedure: CORONARY ARTERY BYPASS GRAFTING (CABG);  Surgeon: [name], MD;  Location: [place];  Service: Open Heart Surgery;  Laterality: N/A;  . EP IMPLANTABLE DEVICE N/A 05/01/2015   MDT ICD implanted for primary prevention of sudden death  . ESOPHAGOGASTRODUODENOSCOPY (EGD) WITH PROPOFOL N/A 04/16/2015   Procedure: ESOPHAGOGASTRODUODENOSCOPY (EGD) WITH PROPOFOL;  Surgeon: [name], MD;  Location: [facility] ENDOSCOPY;  Service: Endoscopy;  Laterality: N/A;  . ESOPHAGOGASTRODUODENOSCOPY (EGD) WITH PROPOFOL N/A 08/24/2017   Procedure: ESOPHAGOGASTRODUODENOSCOPY (EGD) WITH PROPOFOL;  Surgeon: [name], MD;  Location: [facility] ENDOSCOPY;  Service: Endoscopy;  Laterality: N/A;  . FRACTURE SURGERY    . HERNIA REPAIR    . HIP PINNING,CANNULATED Left 07/22/2018   Procedure: CANNULATED HIP PINNING;  Surgeon: [name], MD;  Location: [facility]  OR;  Service: Orthopedics;  Laterality: Left;  . MITRAL VALVE REPAIR N/A 12/30/2018   Procedure: MITRAL VALVE REPAIR;  Surgeon: [name], MD;  Location: [place] [facility];  Service: Cardiovascular;  Laterality: N/A;  . RIGHT/LEFT HEART CATH AND CORONARY/GRAFT ANGIOGRAPHY N/A 12/21/2018   Procedure: RIGHT/LEFT HEART CATH AND CORONARY/GRAFT ANGIOGRAPHY;  Surgeon: [name], MD;  Location: [place] [facility];  Service: Cardiovascular;  Laterality: N/A;  . TEE WITHOUT CARDIOVERSION N/A 08/03/2014   Procedure: TRANSESOPHAGEAL  ECHOCARDIOGRAM (TEE);  Surgeon: [name], MD;  Location: [place];  Service: Open Heart Surgery;  Laterality: N/A;  . TEE WITHOUT CARDIOVERSION N/A 02/10/2018   Procedure: TRANSESOPHAGEAL ECHOCARDIOGRAM (TEE);  Surgeon: [name], MD;  Location: [facility] ENDOSCOPY;  Service: Cardiovascular;  Laterality: N/A;  . TEE WITHOUT CARDIOVERSION N/A 11/09/2019   Procedure: TRANSESOPHAGEAL ECHOCARDIOGRAM (TEE);  Surgeon: [name], MD;  Location: [facility] ENDOSCOPY;  Service: Cardiovascular;  Laterality: N/A;    There were no vitals filed for this visit.   Subjective Assessment - 02/15/20 1128    Subjective Patient reports he is feeling well and does not remember any falls since last session. He denies pain. Patient arrived in the clinic alone, stating he was dropped off by his sister. Has no complaints about how he felt following last treatment session. Patient has significant cognitive impairments and may not be reliable historian.    Pertinent History Patient is a 69 y.o. male who presents to outpatient physical therapy with a referral for medical diagnosis unsteadiness on feet, dizziness and giddiness. This patient's chief complaints consist of feeling unsteady on feet and occasional falling leading to the following functional deficits: difficulty with basic mobility including household and community ambulation, frequent injuries and pain from falling, completing ADLs, IADLs, safety and quality of life. Relevant past medical history and comorbidities are extensive (see above) with particular relevance of cognitive impairment/decline, repeated L femur fracture, presence of AICD, low blood pressure, former smoker, MI with 5x bipass, ischemic cardiomyopathy, CHF, chronic Afib with recent need for cardioversion, neck pain, leg pain, DVT in left arm (recently cleared), cellulitis of L LE (recently treated).  Patient denies hx of cancer, stroke, seizures, lung problem, diabetes, unexplained weight loss.     Limitations Lifting;Standing;Walking;House hold activities    Diagnostic tests US venous Img Upper Uni Lft report 11/20/2019: "IMPRESSION:Nonocclusive thrombus within the left brachial vein near theantecubital fossa" resolved per note on 5/27/2021CT Lumbar spine report 06/19/2020:"IMPRESSION:1. Healing right sacral ala fracture. This likely contributes to thepatient's right-sided hip pain.2. Dynamic listhesis with mild subarticular and foraminal stenosisbilaterally at L4-5. The stenosis is likely worse with standing andflexion.3. Mild right foraminal narrowing at L2-3.4. Broad-based disc protrusion at L2-3 and L3-4 extends into theforamina bilaterally both levels without other significant stenosis.5. Moderate left and mild right foraminal stenosis at L5-S1secondary to chronic broad-based disc protrusion and facethypertrophy. Mild left subarticular narrowing is present as well.6.  Aortic Atherosclerosis (ICD10-I70.0)."    Currently in Pain? No/denies           TREATMENT:  Recent  DVT in left elbow region High fall risk! Cognitive deficits  Neuromuscular Re-education: to improve, balance, postural strength, muscle activation patterns, and stabilization strength required for functional activities, SBA - min A provided for all standing tasks for safey:  Standing on Airex pad (complient surface), Treadmill bar in front of patient for UE support as needed, encouraged no hands during activities:  - horizontal head turns, self selected stance x 20 - vertical head  nods, self-selected stance, x 20 - eyes closed, self selected stance, x 1 minute - horizontal head turns, narrow stance, x 20 - vertical head nods, narrow stance, x 20 - eyes closed, narrow stance, x 1 minute (much more challenging in narrow stance and required several p pauses to reset).   Standing near TM bar for intermittant UE support/safety:  - side stepping over two yoga blocks lined up to create a low barrier, 2x20 each way (with  breaks to reset at times due to hitting them with feet). Required max cuing to improve step length to allow room for second foot.  - forward step over yoga block, then push back with same foot to return to starting position. 2x20 each side with pauses to reset knocked blocks. Completed with metronome last set on left side. Metronome set at 36 BPM, challenging but apparently conforming to beat.   - standing single arm row with black theraband 1x20 each side to improve dynamic balance during functional activity that applies dynamic force changes on the body. Attempted with Metronome set at 44BPM, but pt unable to slow his natural (overly fast) rhythm to conform to beat. Did show signs of effort to do so. Required significant cuing for form and guarding to improve safety.   - standing alternating toe taps on 8 inch cones, 2x 10 each side, knocking them down occasionally.   - ambulation with SPC and close SBA ~ 100 feet to look for sister who is picking him up outside. She had not arrived so returned to waiting room in the line of sight of front office staff to monitor until she arrives.   Pt required multimodal cuing for proper technique and to facilitate improved neuromuscular control, strength, range of motion, and functional ability resulting in improved performance and form. Seated rest breaks between exercises and some sets while clinician clearly demonstrated next task.     PT Education - 02/15/20 1130    Education Details Exercise purpose/form    Person(s) Educated Patient    Methods Explanation;Demonstration;Tactile cues;Verbal cues    Comprehension Verbalized understanding;Returned demonstration;Verbal cues required;Tactile cues required;Need further instruction            PT Short Term Goals - 02/09/20 1208      PT SHORT TERM GOAL #1   Title Patient/family indepndent with initial home exercise program for self-management of symptoms.    Baseline to be established as safe and  appropriate visit 2 (02/08/2020);    Time 2    Period Weeks    Status New    Target Date 02/23/20             PT Long Term Goals - 02/09/20 1209      PT LONG TERM GOAL #1   Title Family/patient is independent with a long-term home exercise program for self-management of symptoms.    Baseline to be established as safe and appropriate visit 2 (02/08/2020);    Time 12    Period Weeks    Status New    Target Date --   TARGET DATE FOR ALL LONG TERM GOALS: 05/02/2020     PT LONG TERM GOAL #2   Title Patient will score equal or more than 25/30 on Functional Gait Assessment to demonstrate low fall risk.    Baseline to be tested visit 2 (02/08/2020);    Time 12    Period Weeks    Status New      PT LONG TERM GOAL #3   Title  Patient will demonstrate the ability to complete sequential tapping steps on 8 inch step independently to demonstrate improved ability to navigate around objects without falling.    Baseline required support and repeated instructions (02/08/2020);    Time 12    Period Weeks    Status New      PT LONG TERM GOAL #4   Title Patient will demonstrated ability to complete Five Time Sit to Stand Test in equal or less than 12 seconds from 18.5 inch surface without UE support to demonstrate improved balance and safety with transfers.    Baseline 17 seconds from 18.5 inch plinth, initial hand use. . (02/08/2020);    Time 12    Period Weeks    Status New      PT LONG TERM GOAL #5   Title Patient will report less than 1 fall over 8 weeks to demonstrate improved safety with functional mobility.    Baseline at least 5 falls in the last 6 months (02/08/2020);    Time 12    Period Weeks    Status New                 Plan - 02/15/20 1142    Clinical Impression Statement Patient tolerated treatment well overall. Focus of today's session was static and dynamic balance activities. Patient required significant cuing to learn each task and struggled at times to change his  natural pattern to improve form. Occasionally lost balance but was mostly able to catch himself with step strategy but also occasionally needed physical assistance to prevent a fall. Did not use SPC during session except when ambulating in/out of clinic. Patient found instructions to move to the beat of a metronome very challenging. This intervention was included to improve conscious control and self regulation of activities while maintaining balance. Plan to continue utilizing at future sessions. Patient would benefit from continued management of limiting condition by skilled physical therapist to address remaining impairments and functional limitations to work towards stated goals and return to PLOF or maximal functional independence.    Personal Factors and Comorbidities Age;Time since onset of injury/illness/exacerbation;Behavior Pattern;Comorbidity 3+;Education;Social Background;Past/Current Experience;Fitness;Other   Cognition   Comorbidities Relevant past medical history and comorbidities are extensive (see above) with particular relevance of cognitive impairment/decline, repeated L femur fracture, presence of AICD, low blood pressure, former smoker, MI with 5x bipass, ischemic cardiomyopathy, CHF, chronic Afib with recent need for cardioversion, neck pain, leg pain, DVT in left arm (recently cleared), cellulitis of L LE (recently treated).    Examination-Activity Limitations Carry;Stairs;Sit;Reach Overhead;Locomotion Level;Stand;Lift;Dressing;Bend;Transfers;Other   basic household and community ambulatoin   Examination-Participation Restrictions Community Activity;Cleaning;Meal Prep;Medication Management;Driving;Shop;Yard Work    Stability/Clinical Decision Making Evolving/Moderate complexity    Rehab Potential Fair    PT Frequency 2x / week    PT Duration 12 weeks    PT Treatment/Interventions ADLs/Self Care Home Management;Cryotherapy;Canalith Repostioning;Moist Heat;Gait training;Stair  training;Functional mobility training;Therapeutic activities;Therapeutic exercise;Balance training;Neuromuscular re-education;Cognitive remediation;Patient/family education;Manual techniques;Dry needling;Vestibular;Visual/perceptual remediation/compensation;Joint Manipulations;Spinal Manipulations    PT Next Visit Plan progressive balance training, HEP    PT Home Exercise Plan to be established as appropriate based on cognition and safety    Consulted and Agree with Plan of Care Patient           Patient will benefit from skilled therapeutic intervention in order to improve the following deficits and impairments:  Abnormal gait, Decreased cognition, Decreased knowledge of use of DME, Dizziness, Improper body mechanics, Pain, Impaired tone, Decreased coordination, Decreased mobility, Decreased  activity tolerance, Decreased endurance, Difficulty walking, Decreased safety awareness, Decreased knowledge of precautions, Decreased balance  Visit Diagnosis: Difficulty in walking, not elsewhere classified  Unsteadiness on feet  Dizziness and giddiness     Problem List Patient Active Problem List   Diagnosis Date Noted  . Persistent atrial fibrillation ([place]) 11/30/2019  . Secondary hypercoagulable state ([place]) 11/30/2019  . Arm DVT (deep venous thromboembolism), acute, left ([place]) 11/26/2019  . Hx of adenomatous colonic polyps 05/18/2019  . Cirrhosis, non-alcoholic ([place]) 19/16/6060  . Occult blood positive stool 05/12/2019  . HCAP (healthcare-associated pneumonia) 03/04/2019  . Hallucinations 02/10/2019  . Elevated blood sugar 01/27/2019  . S/P mitral valve repair 12/31/2018  . Bilateral pulmonary contusion 10/17/2018  . Cardiac LV ejection fraction of 20-34% 10/17/2018  . Chronic systolic CHF (congestive heart failure) ([place]) 10/17/2018  . History of ischemic cardiomyopathy 10/17/2018  . ICD (implantable cardioverter-defibrillator) in place 10/17/2018  . S/p left hip fracture 10/17/2018   . Traumatic retroperitoneal hematoma 10/17/2018  . Age-related osteoporosis with current pathological fracture with routine healing 07/28/2018  . CAD (coronary artery disease) 07/21/2018  . GERD (gastroesophageal reflux disease) 07/21/2018  . Depression 07/21/2018  . CKD (chronic kidney disease), stage III 07/21/2018  . Iron deficiency anemia 07/21/2018  . Hypokalemia 07/21/2018  . Closed left hip fracture ([place]) 07/21/2018  . Fracture of femoral neck, left ([place]) 07/21/2018  . Lower extremity pain, bilateral 11/12/2017  . 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 Hyperlipidemia 09/01/2017  . Essential hypertension 09/01/2017  . PAD (peripheral artery disease) ([place]) 09/01/2017  . Anemia, unspecified 08/06/2017  . GAD (generalized anxiety disorder) 06/18/2017  . Prostate cancer screening 04/28/2017  . Hydrocele 04/28/2017  . Bradycardia   . Premature ventricular contraction   . Severe mitral insufficiency   . Cardiomyopathy, ischemic 04/26/2015  . Difficulty hearing 12/29/2014  . Acute on chronic systolic CHF (congestive heart failure) ([place]) 10/10/2014  . 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 Protein-calorie malnutrition, severe ([place]) 07/29/2014  . AKI (acute kidney injury) ([place]) 07/29/2014   Everlean Alstrom. Graylon Good, PT, DPT 02/15/20, 11:43 AM  [facility] [address], [place], [address] Phone: [phone]   Fax:  [phone]  Name: Carl [name] MRN: [id number] Date of Birth: 21-May-1951

## 2020-02-20 ENCOUNTER — Ambulatory Visit (INDEPENDENT_AMBULATORY_CARE_PROVIDER_SITE_OTHER): Payer: Medicare Other

## 2020-02-20 ENCOUNTER — Ambulatory Visit: Payer: Medicare Other | Admitting: Physical Therapy

## 2020-02-20 ENCOUNTER — Encounter: Payer: Medicare Other | Admitting: Physical Therapy

## 2020-02-20 ENCOUNTER — Encounter: Payer: Self-pay | Admitting: Physical Therapy

## 2020-02-20 ENCOUNTER — Other Ambulatory Visit: Payer: Self-pay

## 2020-02-20 DIAGNOSIS — R262 Difficulty in walking, not elsewhere classified: Secondary | ICD-10-CM

## 2020-02-20 DIAGNOSIS — R42 Dizziness and giddiness: Secondary | ICD-10-CM

## 2020-02-20 DIAGNOSIS — I5022 Chronic systolic (congestive) heart failure: Secondary | ICD-10-CM | POA: Diagnosis not present

## 2020-02-20 DIAGNOSIS — Z95810 Presence of automatic (implantable) cardiac defibrillator: Secondary | ICD-10-CM | POA: Diagnosis not present

## 2020-02-20 DIAGNOSIS — R2681 Unsteadiness on feet: Secondary | ICD-10-CM

## 2020-02-20 NOTE — Progress Notes (Signed)
EPIC Encounter for ICM Monitoring  Patient Name: Carl [name] is a 69 y.o. male Date: 02/20/2020 Primary Care Physican: [name], MD Primary Cardiologist:[place] Electrophysiologist: Allred 02/20/2020 OfficeWeight: 158lbs  Time in AF        <0.1 hr/day (0.3%)  Spoke with brother, Rexford Prevo.  He reports patient is doing well and denies any fluid symptoms.  Patient's sister frequently takes patient out to eat at restaurants.  OptivolThoracic impedancesuggesting possible fluid accumulation since 02/13/2020.  Prescribed:  Torsemide20mg take3tablets(60mg  total)by mouthtwice a day.  Metolazone 2.5 mg take 1 tablet by mouth every Wednesday morning with 1 extra Potassium.   Klor Con 20 mEqTaking 40 meq in morning and 20 meq in evening.  Extra 20 meq on Wednesday morning with Metolazone.  Labs: 01/27/2020 Creatinine 2.2,   BUN 32, Potassium 4.0, Sodium 134, GFR 30 ([facility]) 01/12/2020 Creatinine 2.00, BUN 64, Potassium 3.6, Sodium 129, GFR 33-39 12/21/2019 Creatinine 2.04, BUN 31, Potassium 4.6, Sodium 131, GFR 33-38  12/19/2019 Creatinine 2.04, BUN 35, Potassium 4.2, Sodium 128, GFR 33-38  11/30/2019 Creatinine 1.87, BUN 38, Potassium 4.6, Sodium 131, GFR 36-42 A complete set of results can be found in Results Review  Recommendations: Advised to have patient avoid foods high in salt such as restaurant foods.  Patient is followed by [employer], [name].  Follow-up plan: ICM clinic phone appointment on7/03/2020 to recheck fluid levels. 91 day device clinic remote transmission 04/26/2020.  Next office visit 04/25/2020 with Dr [name].  Copy of ICM check sent to Dr.Allred and Dr [name] for review and recommendations if needed.  3 month ICM trend: 02/20/2020    1 Year ICM trend:       Rosalene Billings, RN 02/20/2020 2:59 PM

## 2020-02-20 NOTE — Progress Notes (Signed)
Take torsemide 80 qam/60 qpm x 2 days then back to 60 bid.

## 2020-02-20 NOTE — Therapy (Signed)
[facility] [address] [place], [address] Phone: [phone]   Fax:  [phone]  Physical Therapy Treatment  Patient Details  Name: Carl [name] MRN: [id number] Date of Birth: 02-28-1951 Referring Provider (PT): [name], NP   Encounter Date: 02/20/2020   PT End of Session - 02/20/20 1324    Visit Number 4    Number of Visits 24    Date for PT Re-Evaluation 05/02/20    Authorization Type [employer] Medicare Reporting period from 02/08/2020    Authorization - Visit Number 4    Authorization - Number of Visits 10    Progress Note Due on Visit 10    PT Start Time 4128    PT Stop Time 1115    PT Time Calculation (min) 40 min    Equipment Utilized During Treatment Gait belt    Activity Tolerance Patient tolerated treatment well    Behavior During Therapy [facility] for tasks assessed/performed           Past Medical History:  Diagnosis Date  . AICD (automatic cardioverter/defibrillator) present   . Anemia   . Anxiety   . Arthritis   . Brunner's gland hyperplasia of duodenum   . CHF (congestive heart failure) ([place])   . Coronary artery disease   . External hemorrhoids   . Fracture of femoral neck, left ([place]) 07/21/2018  . GERD (gastroesophageal reflux disease)   . Hearing loss   . HH (hiatus hernia)   . Internal hemorrhoids   . Ischemic cardiomyopathy   . Leg fracture, left   . Myocardial infarction ([place])   . Paronychia   . Pneumonia   . Psoriasis   . PUD (peptic ulcer disease)   . Schatzki's ring   . Tubular adenoma of colon   . Vitiligo     Past Surgical History:  Procedure Laterality Date  . ATRIAL SEPTAL DEFECT(ASD) CLOSURE N/A 12/30/2018   Procedure: ATRIAL SEPTAL DEFECT(ASD) CLOSURE;  Surgeon: [name], MD;  Location: [place];  Service: Cardiovascular;  Laterality: N/A;  . CARDIAC SURGERY    . CARDIOVERSION N/A 11/09/2019   Procedure: CARDIOVERSION;  Surgeon: [name], MD;  Location: [facility] ENDOSCOPY;  Service: Cardiovascular;  Laterality: N/A;  . CARDIOVERSION N/A 12/21/2019   Procedure: CARDIOVERSION;  Surgeon: [name], MD;  Location: [facility] ENDOSCOPY;  Service: Cardiovascular;  Laterality: N/A;  . COLONOSCOPY WITH ESOPHAGOGASTRODUODENOSCOPY (EGD)    . COLONOSCOPY WITH PROPOFOL N/A 08/24/2017   Procedure: COLONOSCOPY WITH PROPOFOL;  Surgeon: [name], MD;  Location: [facility] ENDOSCOPY;  Service: Endoscopy;  Laterality: N/A;  . CORONARY ARTERY BYPASS GRAFT N/A 08/03/2014   Procedure: CORONARY ARTERY BYPASS GRAFTING (CABG);  Surgeon: [name], MD;  Location: [place];  Service: Open Heart Surgery;  Laterality: N/A;  . EP IMPLANTABLE DEVICE N/A 05/01/2015   MDT ICD implanted for primary prevention of sudden death  . ESOPHAGOGASTRODUODENOSCOPY (EGD) WITH PROPOFOL N/A 04/16/2015   Procedure: ESOPHAGOGASTRODUODENOSCOPY (EGD) WITH PROPOFOL;  Surgeon: [name], MD;  Location: [facility] ENDOSCOPY;  Service: Endoscopy;  Laterality: N/A;  . ESOPHAGOGASTRODUODENOSCOPY (EGD) WITH PROPOFOL N/A 08/24/2017   Procedure: ESOPHAGOGASTRODUODENOSCOPY (EGD) WITH PROPOFOL;  Surgeon: [name], MD;  Location: [facility] ENDOSCOPY;  Service: Endoscopy;  Laterality: N/A;  . FRACTURE SURGERY    . HERNIA REPAIR    . HIP PINNING,CANNULATED Left 07/22/2018   Procedure: CANNULATED HIP PINNING;  Surgeon: [name], MD;  Location: [facility]  OR;  Service: Orthopedics;  Laterality: Left;  . MITRAL VALVE REPAIR N/A 12/30/2018   Procedure: MITRAL VALVE REPAIR;  Surgeon: [name], MD;  Location: [place] [facility];  Service: Cardiovascular;  Laterality: N/A;  . RIGHT/LEFT HEART CATH AND CORONARY/GRAFT ANGIOGRAPHY N/A 12/21/2018   Procedure: RIGHT/LEFT HEART CATH AND CORONARY/GRAFT ANGIOGRAPHY;  Surgeon: [name], MD;  Location: [place] [facility];  Service: Cardiovascular;  Laterality: N/A;  . TEE WITHOUT CARDIOVERSION N/A 08/03/2014   Procedure: TRANSESOPHAGEAL  ECHOCARDIOGRAM (TEE);  Surgeon: [name], MD;  Location: [place];  Service: Open Heart Surgery;  Laterality: N/A;  . TEE WITHOUT CARDIOVERSION N/A 02/10/2018   Procedure: TRANSESOPHAGEAL ECHOCARDIOGRAM (TEE);  Surgeon: [name], MD;  Location: [facility] ENDOSCOPY;  Service: Cardiovascular;  Laterality: N/A;  . TEE WITHOUT CARDIOVERSION N/A 11/09/2019   Procedure: TRANSESOPHAGEAL ECHOCARDIOGRAM (TEE);  Surgeon: [name], MD;  Location: [facility] ENDOSCOPY;  Service: Cardiovascular;  Laterality: N/A;    There were no vitals filed for this visit.   Subjective Assessment - 02/20/20 1323    Subjective Patient reports he is feeling well today and did not have any excessive pain or soreness following last session. He does not remember any falls since last session.    Pertinent History Patient is a 69 y.o. male who presents to outpatient physical therapy with a referral for medical diagnosis unsteadiness on feet, dizziness and giddiness. This patient's chief complaints consist of feeling unsteady on feet and occasional falling leading to the following functional deficits: difficulty with basic mobility including household and community ambulation, frequent injuries and pain from falling, completing ADLs, IADLs, safety and quality of life. Relevant past medical history and comorbidities are extensive (see above) with particular relevance of cognitive impairment/decline, repeated L femur fracture, presence of AICD, low blood pressure, former smoker, MI with 5x bipass, ischemic cardiomyopathy, CHF, chronic Afib with recent need for cardioversion, neck pain, leg pain, DVT in left arm (recently cleared), cellulitis of L LE (recently treated).  Patient denies hx of cancer, stroke, seizures, lung problem, diabetes, unexplained weight loss.    Limitations Lifting;Standing;Walking;House hold activities    Diagnostic tests US venous Img Upper Uni Lft report 11/20/2019: "IMPRESSION:Nonocclusive thrombus within the left  brachial vein near theantecubital fossa" resolved per note on 5/27/2021CT Lumbar spine report 06/19/2020:"IMPRESSION:1. Healing right sacral ala fracture. This likely contributes to thepatient's right-sided hip pain.2. Dynamic listhesis with mild subarticular and foraminal stenosisbilaterally at L4-5. The stenosis is likely worse with standing andflexion.3. Mild right foraminal narrowing at L2-3.4. Broad-based disc protrusion at L2-3 and L3-4 extends into theforamina bilaterally both levels without other significant stenosis.5. Moderate left and mild right foraminal stenosis at L5-S1secondary to chronic broad-based disc protrusion and facethypertrophy. Mild left subarticular narrowing is present as well.6.  Aortic Atherosclerosis (ICD10-I70.0)."    Currently in Pain? No/denies           TREATMENT: Recent DVT in left elbow region High fall risk! Cognitive deficits  Neuromuscular Re-education: to improve, balance, postural strength, muscle activation patterns, and stabilization strength required for functional activities, SBA - min A provided for all standing tasks for safey:  Standing on Airex pad (complient surface), Treadmill bar in front of patient for UE support as needed, encouraged no hands during activities:  - horizontal head turns, narrow stance,2 x 20 - vertical head nods, narrow stance, 2x 20 - eyes closed, narrow stance, 2x 1 minute (much more challenging in narrow stance and required several p pauses to reset).   Standing  near TM bar for intermittant UE support/safety:  - side stepping over two yoga blocks lined up to create a low barrier, 2x20 each way (with breaks to reset at times due to hitting them with feet). Required max cuing to improve step length to allow room for second foot. 20 bpm (one side step per beat), then 40 bpm (one beat per step) - forward step over yoga block, with both feet then step back over yoga blocks with both feet. ~1x20 each side with pauses to  reset knocked blocks. Metromone adjusted to 34 bpm with one step per beat. More difficult moving left foot forward.  - sit <> stand from green chair with airex on top of seat while holding a 4# DB in both hands and pressing out in front upon standing. Working on improving balance upon immediate standing, especially when tired. 2x15. SBA for safety. - ambulation with SPC and close SBA ~ 100 feet to look for sister who is picking him up outside. She had not arrived so returned to waiting room in the line of sight of front office staff to monitor until she arrives.  Pt required multimodal cuing for proper technique and to facilitate improved neuromuscular control, strength, range of motion, and functional ability resulting in improved performance and form. Seated rest breaks between exercises and some sets while clinician clearly demonstrated next task.     PT Education - 02/20/20 1324    Education Details Exercise purpose/form    Person(s) Educated Patient    Methods Explanation;Demonstration;Tactile cues;Verbal cues    Comprehension Verbalized understanding;Returned demonstration;Verbal cues required;Tactile cues required;Need further instruction            PT Short Term Goals - 02/09/20 1208      PT SHORT TERM GOAL #1   Title Patient/family indepndent with initial home exercise program for self-management of symptoms.    Baseline to be established as safe and appropriate visit 2 (02/08/2020);    Time 2    Period Weeks    Status New    Target Date 02/23/20             PT Long Term Goals - 02/09/20 1209      PT LONG TERM GOAL #1   Title Family/patient is independent with a long-term home exercise program for self-management of symptoms.    Baseline to be established as safe and appropriate visit 2 (02/08/2020);    Time 12    Period Weeks    Status New    Target Date --   TARGET DATE FOR ALL LONG TERM GOALS: 05/02/2020     PT LONG TERM GOAL #2   Title Patient will score equal  or more than 25/30 on Functional Gait Assessment to demonstrate low fall risk.    Baseline to be tested visit 2 (02/08/2020);    Time 12    Period Weeks    Status New      PT LONG TERM GOAL #3   Title Patient will demonstrate the ability to complete sequential tapping steps on 8 inch step independently to demonstrate improved ability to navigate around objects without falling.    Baseline required support and repeated instructions (02/08/2020);    Time 12    Period Weeks    Status New      PT LONG TERM GOAL #4   Title Patient will demonstrated ability to complete Five Time Sit to Stand Test in equal or less than 12 seconds from 18.5 inch surface without UE  support to demonstrate improved balance and safety with transfers.    Baseline 17 seconds from 18.5 inch plinth, initial hand use. . (02/08/2020);    Time 12    Period Weeks    Status New      PT LONG TERM GOAL #5   Title Patient will report less than 1 fall over 8 weeks to demonstrate improved safety with functional mobility.    Baseline at least 5 falls in the last 6 months (02/08/2020);    Time 12    Period Weeks    Status New                 Plan - 02/20/20 1329    Clinical Impression Statement Patient tolerated treatment well overall and was able to improve ability to self-regulate steps with the metronome over the course of the visit with multimodal cuing and practice. Stumbles over yoga blocks occasionally and requires min A and UE support to prevent falls. Patient would benefit from continued management of limiting condition by skilled physical therapist to address remaining impairments and functional limitations to work towards stated goals and return to PLOF or maximal functional independence.    Personal Factors and Comorbidities Age;Time since onset of injury/illness/exacerbation;Behavior Pattern;Comorbidity 3+;Education;Social Background;Past/Current Experience;Fitness;Other   Cognition   Comorbidities Relevant past  medical history and comorbidities are extensive (see above) with particular relevance of cognitive impairment/decline, repeated L femur fracture, presence of AICD, low blood pressure, former smoker, MI with 5x bipass, ischemic cardiomyopathy, CHF, chronic Afib with recent need for cardioversion, neck pain, leg pain, DVT in left arm (recently cleared), cellulitis of L LE (recently treated).    Examination-Activity Limitations Carry;Stairs;Sit;Reach Overhead;Locomotion Level;Stand;Lift;Dressing;Bend;Transfers;Other   basic household and community ambulatoin   Examination-Participation Restrictions Community Activity;Cleaning;Meal Prep;Medication Management;Driving;Shop;Yard Work    Stability/Clinical Decision Making Evolving/Moderate complexity    Rehab Potential Fair    PT Frequency 2x / week    PT Duration 12 weeks    PT Treatment/Interventions ADLs/Self Care Home Management;Cryotherapy;Canalith Repostioning;Moist Heat;Gait training;Stair training;Functional mobility training;Therapeutic activities;Therapeutic exercise;Balance training;Neuromuscular re-education;Cognitive remediation;Patient/family education;Manual techniques;Dry needling;Vestibular;Visual/perceptual remediation/compensation;Joint Manipulations;Spinal Manipulations    PT Next Visit Plan progressive balance training, HEP    PT Home Exercise Plan to be established as appropriate based on cognition and safety    Consulted and Agree with Plan of Care Patient           Patient will benefit from skilled therapeutic intervention in order to improve the following deficits and impairments:  Abnormal gait, Decreased cognition, Decreased knowledge of use of DME, Dizziness, Improper body mechanics, Pain, Impaired tone, Decreased coordination, Decreased mobility, Decreased activity tolerance, Decreased endurance, Difficulty walking, Decreased safety awareness, Decreased knowledge of precautions, Decreased balance  Visit Diagnosis: Difficulty  in walking, not elsewhere classified  Unsteadiness on feet  Dizziness and giddiness     Problem List Patient Active Problem List   Diagnosis Date Noted  . Persistent atrial fibrillation ([place]) 11/30/2019  . Secondary hypercoagulable state ([place]) 11/30/2019  . Arm DVT (deep venous thromboembolism), acute, left ([place]) 11/26/2019  . Hx of adenomatous colonic polyps 05/18/2019  . 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 Occult blood positive stool 05/12/2019  . HCAP (healthcare-associated pneumonia) 03/04/2019  . Hallucinations 02/10/2019  . Elevated blood sugar 01/27/2019  . S/P mitral valve repair 12/31/2018  . Bilateral pulmonary contusion 10/17/2018  . Cardiac LV ejection fraction of 20-34% 10/17/2018  . Chronic systolic CHF (congestive heart failure) ([place]) 10/17/2018  . History of ischemic cardiomyopathy 10/17/2018  . ICD (  implantable cardioverter-defibrillator) in place 10/17/2018  . S/p left hip fracture 10/17/2018  . Traumatic retroperitoneal hematoma 10/17/2018  . Age-related osteoporosis with current pathological fracture with routine healing 07/28/2018  . CAD (coronary artery disease) 07/21/2018  . GERD (gastroesophageal reflux disease) 07/21/2018  . Depression 07/21/2018  . CKD (chronic kidney disease), stage III 07/21/2018  . Iron deficiency anemia 07/21/2018  . Hypokalemia 07/21/2018  . Closed left hip fracture ([place]) 07/21/2018  . Fracture of femoral neck, left ([place]) 07/21/2018  . Lower extremity pain, bilateral 11/12/2017  . 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 Hyperlipidemia 09/01/2017  . Essential hypertension 09/01/2017  . PAD (peripheral artery disease) ([place]) 09/01/2017  . Anemia, unspecified 08/06/2017  . GAD (generalized anxiety disorder) 06/18/2017  . Prostate cancer screening 04/28/2017  . Hydrocele 04/28/2017  . Bradycardia   . Premature ventricular contraction   . Severe mitral insufficiency   . Cardiomyopathy, ischemic  04/26/2015  . Difficulty hearing 12/29/2014  . Acute on chronic systolic CHF (congestive heart failure) ([place]) 10/10/2014  . 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 Protein-calorie malnutrition, severe ([place]) 07/29/2014  . AKI (acute kidney injury) ([place]) 07/29/2014   Everlean Alstrom. Graylon Good, PT, DPT 02/20/20, 1:30 PM  [facility] [address], [place], [address] Phone: [phone]   Fax:  [phone]  Name: Carl [name] MRN: [id number] Date of Birth: 01-Mar-1951

## 2020-02-21 ENCOUNTER — Encounter (HOSPITAL_COMMUNITY): Payer: Self-pay

## 2020-02-21 ENCOUNTER — Other Ambulatory Visit (HOSPITAL_COMMUNITY): Payer: Self-pay

## 2020-02-21 NOTE — Progress Notes (Signed)
Spoke with [name] [phone]) EMT, from parmamedic program and advised of Dr Claris Gladden recommendation for patient to take torsemide 80 qam/60 qpm x 2 days then back to 60 bid. Kristi verbalized understanding and will adjust patient's medications today at her routine home visit. Steffanie Dunn reports a change in patient's routine in the last 3-4 weeks which may correlate with decreased impedance.  She reports patient's sister is taking patient to outpatient therapy 3 times a week and eating breakfast at fast food restaurants on therapy days.

## 2020-02-21 NOTE — Progress Notes (Signed)
Today had a home visit with Carl [name].  Carl [name] states he feels good today.  He states physical therapy is helping his legs feel better.  He denies any chest pain, shortness of breath, headaches or dizziness.  [name] had contacted me and advised per Dr [name] increase for next 2 days torsemide to 80 mg in morning due to decreased impedence.  [name] that he is showing he has fluid and his weight is corresponding to that also, his weight has been up and down during the past month but has trending up in past week.  He has no swelling in lower extremities and he states abdomen does not feel tight. He states he has been eating out with sister [name] more and eating salty foods such has fried chicken, salisbury steak, etc.  We talked about alternatives and to reduce his pepsi again.  I advised him to watch any foods that are fried or has gravy, he appeared to understand a little.  Attempted to contact his sister but no answer.  [name] has all his medications, refilled his boxes and changed torsemide dose for next 2 days per Dr [name] order.  Will continue to visit with Carl [name] for heart failure, medication compliance and diet.  It does appear [name] has been taking all his medications appropriate for past 2 weeks.    [name] [place] [phone]

## 2020-02-22 ENCOUNTER — Other Ambulatory Visit: Payer: Self-pay | Admitting: Cardiology

## 2020-02-22 ENCOUNTER — Ambulatory Visit: Payer: Medicare Other | Admitting: Physical Therapy

## 2020-02-22 ENCOUNTER — Encounter: Payer: Medicare Other | Admitting: Physical Therapy

## 2020-02-22 ENCOUNTER — Other Ambulatory Visit: Payer: Self-pay

## 2020-02-22 ENCOUNTER — Encounter: Payer: Self-pay | Admitting: Physical Therapy

## 2020-02-22 DIAGNOSIS — R262 Difficulty in walking, not elsewhere classified: Secondary | ICD-10-CM

## 2020-02-22 DIAGNOSIS — R2681 Unsteadiness on feet: Secondary | ICD-10-CM

## 2020-02-22 DIAGNOSIS — R42 Dizziness and giddiness: Secondary | ICD-10-CM

## 2020-02-22 NOTE — Therapy (Signed)
[facility] [address] [place], [address] Phone: [phone]   Fax:  [phone]  Physical Therapy Treatment  Patient Details  Name: Carl [name] MRN: [id number] Date of Birth: 1950/10/28 Referring Provider (PT): [name], NP   Encounter Date: 02/22/2020   PT End of Session - 02/22/20 1756    Visit Number 5    Number of Visits 24    Date for PT Re-Evaluation 05/02/20    Authorization Type [employer] Medicare Reporting period from 02/08/2020    Authorization - Visit Number 5    Authorization - Number of Visits 10    Progress Note Due on Visit 10    PT Start Time 1037    PT Stop Time 1115    PT Time Calculation (min) 38 min    Equipment Utilized During Treatment Gait belt    Activity Tolerance Patient tolerated treatment well    Behavior During Therapy [facility] for tasks assessed/performed           Past Medical History:  Diagnosis Date  . AICD (automatic cardioverter/defibrillator) present   . Anemia   . Anxiety   . Arthritis   . Brunner's gland hyperplasia of duodenum   . CHF (congestive heart failure) ([place])   . Coronary artery disease   . External hemorrhoids   . Fracture of femoral neck, left ([place]) 07/21/2018  . GERD (gastroesophageal reflux disease)   . Hearing loss   . HH (hiatus hernia)   . Internal hemorrhoids   . Ischemic cardiomyopathy   . Leg fracture, left   . Myocardial infarction ([place])   . Paronychia   . Pneumonia   . Psoriasis   . PUD (peptic ulcer disease)   . Schatzki's ring   . Tubular adenoma of colon   . Vitiligo     Past Surgical History:  Procedure Laterality Date  . ATRIAL SEPTAL DEFECT(ASD) CLOSURE N/A 12/30/2018   Procedure: ATRIAL SEPTAL DEFECT(ASD) CLOSURE;  Surgeon: [name], MD;  Location: [place] [facility];  Service: Cardiovascular;  Laterality: N/A;  . CARDIAC SURGERY    . CARDIOVERSION N/A 11/09/2019   Procedure: CARDIOVERSION;  Surgeon: [name], MD;  Location: [facility] ENDOSCOPY;  Service: Cardiovascular;  Laterality: N/A;  . CARDIOVERSION N/A 12/21/2019   Procedure: CARDIOVERSION;  Surgeon: [name], MD;  Location: [facility] ENDOSCOPY;  Service: Cardiovascular;  Laterality: N/A;  . COLONOSCOPY WITH ESOPHAGOGASTRODUODENOSCOPY (EGD)    . COLONOSCOPY WITH PROPOFOL N/A 08/24/2017   Procedure: COLONOSCOPY WITH PROPOFOL;  Surgeon: [name], MD;  Location: [facility] ENDOSCOPY;  Service: Endoscopy;  Laterality: N/A;  . CORONARY ARTERY BYPASS GRAFT N/A 08/03/2014   Procedure: CORONARY ARTERY BYPASS GRAFTING (CABG);  Surgeon: [name], MD;  Location: [place];  Service: Open Heart Surgery;  Laterality: N/A;  . EP IMPLANTABLE DEVICE N/A 05/01/2015   MDT ICD implanted for primary prevention of sudden death  . ESOPHAGOGASTRODUODENOSCOPY (EGD) WITH PROPOFOL N/A 04/16/2015   Procedure: ESOPHAGOGASTRODUODENOSCOPY (EGD) WITH PROPOFOL;  Surgeon: [name], MD;  Location[facility] ENDOSCOPY;  Service: Endoscopy;  Laterality: N/A;  . ESOPHAGOGASTRODUODENOSCOPY (EGD) WITH PROPOFOL N/A 08/24/2017   Procedure: ESOPHAGOGASTRODUODENOSCOPY (EGD) WITH PROPOFOL;  Surgeon: [name], MD;  Location: [facility] ENDOSCOPY;  Service: Endoscopy;  Laterality: N/A;  . FRACTURE SURGERY    . HERNIA REPAIR    . HIP PINNING,CANNULATED Left 07/22/2018   Procedure: CANNULATED HIP PINNING;  Surgeon: [name], MD;  Location: [facility]  OR;  Service: Orthopedics;  Laterality: Left;  . MITRAL VALVE REPAIR N/A 12/30/2018   Procedure: MITRAL VALVE REPAIR;  Surgeon: [name], MD;  Location: [place] CV LAB;  Service: Cardiovascular;  Laterality: N/A;  . RIGHT/LEFT HEART CATH AND CORONARY/GRAFT ANGIOGRAPHY N/A 12/21/2018   Procedure: RIGHT/LEFT HEART CATH AND CORONARY/GRAFT ANGIOGRAPHY;  Surgeon: [name], MD;  Location: [place] [facility];  Service: Cardiovascular;  Laterality: N/A;  . TEE WITHOUT CARDIOVERSION N/A 08/03/2014   Procedure: TRANSESOPHAGEAL  ECHOCARDIOGRAM (TEE);  Surgeon: [name], MD;  Location: [place];  Service: Open Heart Surgery;  Laterality: N/A;  . TEE WITHOUT CARDIOVERSION N/A 02/10/2018   Procedure: TRANSESOPHAGEAL ECHOCARDIOGRAM (TEE);  Surgeon: [name], MD;  Location: [facility] ENDOSCOPY;  Service: Cardiovascular;  Laterality: N/A;  . TEE WITHOUT CARDIOVERSION N/A 11/09/2019   Procedure: TRANSESOPHAGEAL ECHOCARDIOGRAM (TEE);  Surgeon: [name], MD;  Location: [facility] ENDOSCOPY;  Service: Cardiovascular;  Laterality: N/A;    There were no vitals filed for this visit.   Subjective Assessment - 02/22/20 1755    Subjective Patient arrived in the clinic alone. States he feels well and his left lower leg hurts a little bit. Reports no falls that he remembers since last time.    Pertinent History Patient is a 69 y.o. male who presents to outpatient physical therapy with a referral for medical diagnosis unsteadiness on feet, dizziness and giddiness. This patient's chief complaints consist of feeling unsteady on feet and occasional falling leading to the following functional deficits: difficulty with basic mobility including household and community ambulation, frequent injuries and pain from falling, completing ADLs, IADLs, safety and quality of life. Relevant past medical history and comorbidities are extensive (see above) with particular relevance of cognitive impairment/decline, repeated L femur fracture, presence of AICD, low blood pressure, former smoker, MI with 5x bipass, ischemic cardiomyopathy, CHF, chronic Afib with recent need for cardioversion, neck pain, leg pain, DVT in left arm (recently cleared), cellulitis of L LE (recently treated).  Patient denies hx of cancer, stroke, seizures, lung problem, diabetes, unexplained weight loss.    Limitations Lifting;Standing;Walking;House hold activities    Diagnostic tests US venous Img Upper Uni Lft report 11/20/2019: "IMPRESSION:Nonocclusive thrombus within the left brachial  vein near theantecubital fossa" resolved per note on 5/27/2021CT Lumbar spine report 06/19/2020:"IMPRESSION:1. Healing right sacral ala fracture. This likely contributes to thepatient's right-sided hip pain.2. Dynamic listhesis with mild subarticular and foraminal stenosisbilaterally at L4-5. The stenosis is likely worse with standing andflexion.3. Mild right foraminal narrowing at L2-3.4. Broad-based disc protrusion at L2-3 and L3-4 extends into theforamina bilaterally both levels without other significant stenosis.5. Moderate left and mild right foraminal stenosis at L5-S1secondary to chronic broad-based disc protrusion and facethypertrophy. Mild left subarticular narrowing is present as well.6.  Aortic Atherosclerosis (ICD10-I70.0)."    Currently in Pain? No/denies           TREATMENT: Recent DVT in left elbow region High fall risk! Cognitive deficits  Neuromuscular Re-education:to improve, balance, postural strength, muscle activation patterns, and stabilization strength required for functional activities, SBA - min A provided for all standing tasks for safey:  Standing on Airex pad (complient surface), Treadmill bar in front of patient for UE support as needed, encouraged no hands during activities:  - horizontal head turns following ball held in both hands, narrow stance,1 x 20 - vertical head nods following ball held in both ahnds, narrow stance, 1x 20 - eyes closed, narrow stance, 1x 1 minute (much more challenging in narrow  stance and required several  pauses to reset).   - sit <> stand standing from airex from seat while holding a DB in both hands and pressing out in front upon standing. From 24.5 surface (nustep at highest level) with 4# DB x 10. From 22.5 inch surface (nustep at level 5) with 4# DB x10. From 22.5 inch surface (nustept at level 5) with 10# DB x 10. CGA - Min A to prevent falls. Working on improving balance upon immediate standing, especially when  tired.  Standing near TM bar for intermittant UE support/safety:  - side stepping over two yoga blocks lined up to create a low barrier, 1x20 each way (with breaks to reset at times due to hitting them with feet). Required max cuing to improve step length to allow room for second foot. 40 bpm (one beat per step) - forward step over yoga block, with both feet then step back over yoga blocks with both feet. ~1x20 each side with pauses to reset knocked blocks. Metromone adjusted to 45 bpm with one step per beat. More difficult moving left foot forward.   - standing alternating double toe taps on cones, 1x20 each side with CGA - min A.   - tandem walking on 10 foot green line, x5 each way. Min A for safety.   - ambulation with SPC and close SBA ~ 100 feet to look for sister who is picking him up outside. She had not arrived so returned to waiting room in the line of sight of front office staff to monitor until she arrives.  Pt required multimodal cuing for proper technique and to facilitate improved neuromuscular control, strength, range of motion, and functional ability resulting in improved performance and form.Seated rest breaks between exercises and some sets while clinician clearly demonstrated next task.    PT Education - 02/22/20 1756    Education Details Exercise purpose/form    Person(s) Educated Patient    Methods Explanation;Demonstration;Tactile cues;Verbal cues    Comprehension Verbalized understanding;Returned demonstration;Verbal cues required;Tactile cues required;Need further instruction            PT Short Term Goals - 02/22/20 1757      PT SHORT TERM GOAL #1   Title Patient/family indepndent with initial home exercise program for self-management of symptoms.    Baseline to be established as safe and appropriate visit 2 (02/08/2020);    Time 2    Period Weeks    Status On-going    Target Date 02/23/20             PT Long Term Goals - 02/09/20 1209      PT  LONG TERM GOAL #1   Title Family/patient is independent with a long-term home exercise program for self-management of symptoms.    Baseline to be established as safe and appropriate visit 2 (02/08/2020);    Time 12    Period Weeks    Status New    Target Date --   TARGET DATE FOR ALL LONG TERM GOALS: 05/02/2020     PT LONG TERM GOAL #2   Title Patient will score equal or more than 25/30 on Functional Gait Assessment to demonstrate low fall risk.    Baseline to be tested visit 2 (02/08/2020);    Time 12    Period Weeks    Status New      PT LONG TERM GOAL #3   Title Patient will demonstrate the ability to complete sequential tapping steps on 8 inch step independently  to demonstrate improved ability to navigate around objects without falling.    Baseline required support and repeated instructions (02/08/2020);    Time 12    Period Weeks    Status New      PT LONG TERM GOAL #4   Title Patient will demonstrated ability to complete Five Time Sit to Stand Test in equal or less than 12 seconds from 18.5 inch surface without UE support to demonstrate improved balance and safety with transfers.    Baseline 17 seconds from 18.5 inch plinth, initial hand use. . (02/08/2020);    Time 12    Period Weeks    Status New      PT LONG TERM GOAL #5   Title Patient will report less than 1 fall over 8 weeks to demonstrate improved safety with functional mobility.    Baseline at least 5 falls in the last 6 months (02/08/2020);    Time 12    Period Weeks    Status New                 Plan - 02/22/20 1804    Clinical Impression Statement Patient tolerated treatment well and required significant cuing for correct form and guarding for safety. Patient is often able to catch his balance with step strategy but does required min A at times to prevent falls during more challenging activities. Patient would benefit from continued management of limiting condition by skilled physical therapist to address  remaining impairments and functional limitations to work towards stated goals and return to PLOF or maximal functional independence    Personal Factors and Comorbidities Age;Time since onset of injury/illness/exacerbation;Behavior Pattern;Comorbidity 3+;Education;Social Background;Past/Current Experience;Fitness;Other   Cognition   Comorbidities Relevant past medical history and comorbidities are extensive (see above) with particular relevance of cognitive impairment/decline, repeated L femur fracture, presence of AICD, low blood pressure, former smoker, MI with 5x bipass, ischemic cardiomyopathy, CHF, chronic Afib with recent need for cardioversion, neck pain, leg pain, DVT in left arm (recently cleared), cellulitis of L LE (recently treated).    Examination-Activity Limitations Carry;Stairs;Sit;Reach Overhead;Locomotion Level;Stand;Lift;Dressing;Bend;Transfers;Other   basic household and community ambulatoin   Examination-Participation Restrictions Community Activity;Cleaning;Meal Prep;Medication Management;Driving;Shop;Yard Work    Stability/Clinical Decision Making Evolving/Moderate complexity    Rehab Potential Fair    PT Frequency 2x / week    PT Duration 12 weeks    PT Treatment/Interventions ADLs/Self Care Home Management;Cryotherapy;Canalith Repostioning;Moist Heat;Gait training;Stair training;Functional mobility training;Therapeutic activities;Therapeutic exercise;Balance training;Neuromuscular re-education;Cognitive remediation;Patient/family education;Manual techniques;Dry needling;Vestibular;Visual/perceptual remediation/compensation;Joint Manipulations;Spinal Manipulations    PT Next Visit Plan progressive balance training, HEP    PT Home Exercise Plan to be established as appropriate based on cognition and safety    Consulted and Agree with Plan of Care Patient           Patient will benefit from skilled therapeutic intervention in order to improve the following deficits and  impairments:  Abnormal gait, Decreased cognition, Decreased knowledge of use of DME, Dizziness, Improper body mechanics, Pain, Impaired tone, Decreased coordination, Decreased mobility, Decreased activity tolerance, Decreased endurance, Difficulty walking, Decreased safety awareness, Decreased knowledge of precautions, Decreased balance  Visit Diagnosis: Difficulty in walking, not elsewhere classified  Unsteadiness on feet  Dizziness and giddiness     Problem List Patient Active Problem List   Diagnosis Date Noted  . Persistent atrial fibrillation ([place]) 11/30/2019  . Secondary hypercoagulable state ([place]) 11/30/2019  . Arm DVT (deep venous thromboembolism), acute, left ([place]) 11/26/2019  . Hx of adenomatous colonic polyps 05/18/2019  .  Cirrhosis, non-alcoholic ([place]) 07/62/2633  . Occult blood positive stool 05/12/2019  . HCAP (healthcare-associated pneumonia) 03/04/2019  . Hallucinations 02/10/2019  . Elevated blood sugar 01/27/2019  . S/P mitral valve repair 12/31/2018  . Bilateral pulmonary contusion 10/17/2018  . Cardiac LV ejection fraction of 20-34% 10/17/2018  . Chronic systolic CHF (congestive heart failure) ([place]) 10/17/2018  . History of ischemic cardiomyopathy 10/17/2018  . ICD (implantable cardioverter-defibrillator) in place 10/17/2018  . S/p left hip fracture 10/17/2018  . Traumatic retroperitoneal hematoma 10/17/2018  . Age-related osteoporosis with current pathological fracture with routine healing 07/28/2018  . CAD (coronary artery disease) 07/21/2018  . GERD (gastroesophageal reflux disease) 07/21/2018  . Depression 07/21/2018  . CKD (chronic kidney disease), stage III 07/21/2018  . Iron deficiency anemia 07/21/2018  . Hypokalemia 07/21/2018  . Closed left hip fracture ([place]) 07/21/2018  . Fracture of femoral neck, left ([place]) 07/21/2018  . Lower extremity pain, bilateral 11/12/2017  . 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 Hyperlipidemia  09/01/2017  . Essential hypertension 09/01/2017  . PAD (peripheral artery disease) ([place]) 09/01/2017  . Anemia, unspecified 08/06/2017  . GAD (generalized anxiety disorder) 06/18/2017  . Prostate cancer screening 04/28/2017  . Hydrocele 04/28/2017  . Bradycardia   . Premature ventricular contraction   . Severe mitral insufficiency   . Cardiomyopathy, ischemic 04/26/2015  . Difficulty hearing 12/29/2014  . Acute on chronic systolic CHF (congestive heart failure) ([place]) 10/10/2014  . 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 Protein-calorie malnutrition, severe ([place]) 07/29/2014  . AKI (acute kidney injury) ([place]) 07/29/2014    Everlean Alstrom. Graylon Good, PT, DPT 02/22/20, 6:06 PM  [facility] [address] [place], [address] Phone: [phone]   Fax:  [phone]  Name: RONIE BARNHART MRN: [id number] Date of Birth: [birth year]

## 2020-02-23 ENCOUNTER — Encounter: Payer: Medicare Other | Admitting: Physical Therapy

## 2020-02-28 ENCOUNTER — Inpatient Hospital Stay: Payer: Medicare Other | Attending: Oncology

## 2020-02-28 ENCOUNTER — Encounter: Payer: Medicare Other | Admitting: Physical Therapy

## 2020-02-28 ENCOUNTER — Ambulatory Visit: Payer: Medicare Other | Attending: Family Medicine

## 2020-02-28 ENCOUNTER — Encounter: Payer: Self-pay | Admitting: Physical Therapy

## 2020-02-28 ENCOUNTER — Other Ambulatory Visit: Payer: Self-pay

## 2020-02-28 DIAGNOSIS — N1831 Chronic kidney disease, stage 3a: Secondary | ICD-10-CM | POA: Diagnosis not present

## 2020-02-28 DIAGNOSIS — D631 Anemia in chronic kidney disease: Secondary | ICD-10-CM | POA: Diagnosis present

## 2020-02-28 DIAGNOSIS — D509 Iron deficiency anemia, unspecified: Secondary | ICD-10-CM

## 2020-02-28 DIAGNOSIS — R2681 Unsteadiness on feet: Secondary | ICD-10-CM | POA: Diagnosis present

## 2020-02-28 DIAGNOSIS — R42 Dizziness and giddiness: Secondary | ICD-10-CM | POA: Insufficient documentation

## 2020-02-28 DIAGNOSIS — R262 Difficulty in walking, not elsewhere classified: Secondary | ICD-10-CM | POA: Diagnosis not present

## 2020-02-28 LAB — IRON AND TIBC
Iron: 53 ug/dL (ref 45–182)
Saturation Ratios: 13 % — ABNORMAL LOW (ref 17.9–39.5)
TIBC: 405 ug/dL (ref 250–450)
UIBC: 352 ug/dL

## 2020-02-28 LAB — CBC WITH DIFFERENTIAL/PLATELET
Abs Immature Granulocytes: 0.1 10*3/uL — ABNORMAL HIGH (ref 0.00–0.07)
Basophils Absolute: 0.1 10*3/uL (ref 0.0–0.1)
Basophils Relative: 1 %
Eosinophils Absolute: 0.3 10*3/uL (ref 0.0–0.5)
Eosinophils Relative: 4 %
HCT: 36.1 % — ABNORMAL LOW (ref 39.0–52.0)
Hemoglobin: 12.2 g/dL — ABNORMAL LOW (ref 13.0–17.0)
Immature Granulocytes: 2 %
Lymphocytes Relative: 11 %
Lymphs Abs: 0.8 10*3/uL (ref 0.7–4.0)
MCH: 32.4 pg (ref 26.0–34.0)
MCHC: 33.8 g/dL (ref 30.0–36.0)
MCV: 96 fL (ref 80.0–100.0)
Monocytes Absolute: 0.9 10*3/uL (ref 0.1–1.0)
Monocytes Relative: 13 %
Neutro Abs: 4.5 10*3/uL (ref 1.7–7.7)
Neutrophils Relative %: 69 %
Platelets: 192 10*3/uL (ref 150–400)
RBC: 3.76 MIL/uL — ABNORMAL LOW (ref 4.22–5.81)
RDW: 16.4 % — ABNORMAL HIGH (ref 11.5–15.5)
WBC: 6.6 10*3/uL (ref 4.0–10.5)
nRBC: 0 % (ref 0.0–0.2)

## 2020-02-28 LAB — FERRITIN: Ferritin: 22 ng/mL — ABNORMAL LOW (ref 24–336)

## 2020-02-28 NOTE — Therapy (Signed)
[name] [place], [address] Phone: [phone]   Fax:  [phone]  Physical Therapy Treatment  Patient Details  Name: Carl [name] MRN: [id number] Date of Birth: [birth year] Referring Provider (PT): [name], NP   Encounter Date: 02/28/2020   PT End of Session - 02/28/20 1044    Visit Number 6    Number of Visits 24    Date for PT Re-Evaluation 05/02/20    Authorization Type [employer] Medicare Reporting period from 02/08/2020    Authorization - Visit Number 6    Authorization - Number of Visits 10    PT Start Time 0955    PT Stop Time 1035    PT Time Calculation (min) 40 min    Equipment Utilized During Treatment Gait belt    Activity Tolerance Patient tolerated treatment well    Behavior During Therapy [facility] for tasks assessed/performed           Past Medical History:  Diagnosis Date  . AICD (automatic cardioverter/defibrillator) present   . Anemia   . Anxiety   . Arthritis   . Brunner's gland hyperplasia of duodenum   . CHF (congestive heart failure) ([place])   . Coronary artery disease   . External hemorrhoids   . Fracture of femoral neck, left ([place]) 07/21/2018  . GERD (gastroesophageal reflux disease)   . Hearing loss   . HH (hiatus hernia)   . Internal hemorrhoids   . Ischemic cardiomyopathy   . Leg fracture, left   . Myocardial infarction ([place])   . Paronychia   . Pneumonia   . Psoriasis   . PUD (peptic ulcer disease)   . Schatzki's ring   . Tubular adenoma of colon   . Vitiligo     Past Surgical History:  Procedure Laterality Date  . ATRIAL SEPTAL DEFECT(ASD) CLOSURE N/A 12/30/2018   Procedure: ATRIAL SEPTAL DEFECT(ASD) CLOSURE;  Surgeon: [name], MD;  Location: [place] [facility];  Service: Cardiovascular;  Laterality: N/A;  . CARDIAC SURGERY    . CARDIOVERSION N/A 11/09/2019   Procedure: CARDIOVERSION;  Surgeon: [name], MD;  Location: [facility]  ENDOSCOPY;  Service: Cardiovascular;  Laterality: N/A;  . CARDIOVERSION N/A 12/21/2019   Procedure: CARDIOVERSION;  Surgeon: [name], MD;  Location: [facility] ENDOSCOPY;  Service: Cardiovascular;  Laterality: N/A;  . COLONOSCOPY WITH ESOPHAGOGASTRODUODENOSCOPY (EGD)    . COLONOSCOPY WITH PROPOFOL N/A 08/24/2017   Procedure: COLONOSCOPY WITH PROPOFOL;  Surgeon: [name], MD;  Location: [facility] ENDOSCOPY;  Service: Endoscopy;  Laterality: N/A;  . CORONARY ARTERY BYPASS GRAFT N/A 08/03/2014   Procedure: CORONARY ARTERY BYPASS GRAFTING (CABG);  Surgeon: [name], MD;  Location: [place];  Service: Open Heart Surgery;  Laterality: N/A;  . EP IMPLANTABLE DEVICE N/A 05/01/2015   MDT ICD implanted for primary prevention of sudden death  . ESOPHAGOGASTRODUODENOSCOPY (EGD) WITH PROPOFOL N/A 04/16/2015   Procedure: ESOPHAGOGASTRODUODENOSCOPY (EGD) WITH PROPOFOL;  Surgeon: [name], MD;  Location[facility] ENDOSCOPY;  Service: Endoscopy;  Laterality: N/A;  . ESOPHAGOGASTRODUODENOSCOPY (EGD) WITH PROPOFOL N/A 08/24/2017   Procedure: ESOPHAGOGASTRODUODENOSCOPY (EGD) WITH PROPOFOL;  Surgeon: [name], MD;  Location: [facility] ENDOSCOPY;  Service: Endoscopy;  Laterality: N/A;  . FRACTURE SURGERY    . HERNIA REPAIR    . HIP PINNING,CANNULATED Left 07/22/2018   Procedure: CANNULATED HIP PINNING;  Surgeon: [name], MD;  Location: [place];  Service: Orthopedics;  Laterality: Left;  .  MITRAL VALVE REPAIR N/A 12/30/2018   Procedure: MITRAL VALVE REPAIR;  Surgeon: [name], MD;  Location: [place] [facility];  Service: Cardiovascular;  Laterality: N/A;  . RIGHT/LEFT HEART CATH AND CORONARY/GRAFT ANGIOGRAPHY N/A 12/21/2018   Procedure: RIGHT/LEFT HEART CATH AND CORONARY/GRAFT ANGIOGRAPHY;  Surgeon: [name], MD;  Location: [place] [facility];  Service: Cardiovascular;  Laterality: N/A;  . TEE WITHOUT CARDIOVERSION N/A 08/03/2014   Procedure: TRANSESOPHAGEAL ECHOCARDIOGRAM (TEE);   Surgeon: [name], MD;  Location: [place];  Service: Open Heart Surgery;  Laterality: N/A;  . TEE WITHOUT CARDIOVERSION N/A 02/10/2018   Procedure: TRANSESOPHAGEAL ECHOCARDIOGRAM (TEE);  Surgeon: [name], MD;  Location: [facility] ENDOSCOPY;  Service: Cardiovascular;  Laterality: N/A;  . TEE WITHOUT CARDIOVERSION N/A 11/09/2019   Procedure: TRANSESOPHAGEAL ECHOCARDIOGRAM (TEE);  Surgeon: [name], MD;  Location: [facility] ENDOSCOPY;  Service: Cardiovascular;  Laterality: N/A;    There were no vitals filed for this visit.   Subjective Assessment - 02/28/20 1002    Subjective Patient is doing well today, dropped off by his sister. Using SPC. Denies any falls since last session.    Patient is accompained by: Family member    Pertinent History Patient is a 69 y.o. male who presents to outpatient physical therapy with a referral for medical diagnosis unsteadiness on feet, dizziness and giddiness. This patient's chief complaints consist of feeling unsteady on feet and occasional falling leading to the following functional deficits: difficulty with basic mobility including household and community ambulation, frequent injuries and pain from falling, completing ADLs, IADLs, safety and quality of life. Relevant past medical history and comorbidities are extensive (see above) with particular relevance of cognitive impairment/decline, repeated L femur fracture, presence of AICD, low blood pressure, former smoker, MI with 5x bipass, ischemic cardiomyopathy, CHF, chronic Afib with recent need for cardioversion, neck pain, leg pain, DVT in left arm (recently cleared), cellulitis of L LE (recently treated).  Patient denies hx of cancer, stroke, seizures, lung problem, diabetes, unexplained weight loss.    Limitations Lifting;Standing;Walking;House hold activities    Diagnostic tests US venous Img Upper Uni Lft report 11/20/2019: "IMPRESSION:Nonocclusive thrombus within the left brachial vein near theantecubital  fossa" resolved per note on 5/27/2021CT Lumbar spine report 06/19/2020:"IMPRESSION:1. Healing right sacral ala fracture. This likely contributes to thepatient's right-sided hip pain.2. Dynamic listhesis with mild subarticular and foraminal stenosisbilaterally at L4-5. The stenosis is likely worse with standing andflexion.3. Mild right foraminal narrowing at L2-3.4. Broad-based disc protrusion at L2-3 and L3-4 extends into theforamina bilaterally both levels without other significant stenosis.5. Moderate left and mild right foraminal stenosis at L5-S1secondary to chronic broad-based disc protrusion and facethypertrophy. Mild left subarticular narrowing is present as well.6.  Aortic Atherosclerosis (ICD10-I70.0)."    Currently in Pain? No/denies           TREATMENT:  Recent  DVT in left elbow region High fall risk! Cognitive deficits   Neuromuscular Re-education: to improve, balance, postural strength, muscle activation patterns, and stabilization strength required for functional activities, SBA - min A provided for all standing tasks for safey:   Standing on Airex pad (compliant surface), Treadmill bar in front of patient for UE support as needed, encouraged no hands during activities:   - horizontal head turns following ball held in both hands, narrow stance,1 x 20 - vertical head nods following ball held in both hands, narrow stance, 1x 20   CGA: -sit <> stand nustep + airex pad x10 with 4# dumbbell -sit <> stand  from nustep without airex pad 2x10 with 6# dumbbell with seated rest break - Sit <> stand from nustep with feet on airex pad cueing for posture and foot placement   Standing near TM bar for intermittent UE support/safety:  - side stepping over two hurdles, pt started by standing in the middle; 1x20 each way (with breaks to reset at times due to hitting them with feet). Required max cuing to improve step length to allow room for second foot.   - forward step over hurdle, with both  feet then step back over hurdle with both feet. ~1x20 each side with pauses to reset hurdle. Pt tended to circumduct LLE, max cueing to promote proper technique.   - standing alternating double toe taps on cones, 1x20 each side with CGA - min A. (x10 and standing rest break, then continued to 20.) cued for posture, activity pacing.   - tandem walking on 10 foot green line, x5 each way. Min A for safety.      Pt required multimodal cuing for proper technique and to facilitate improved neuromuscular control, strength, range of motion, and functional ability resulting in improved performance and form. Seated rest breaks between exercises and some sets while clinician clearly demonstrated next task.   Patient response/clinical impression: The patient demonstrated great motivation during session. Several seated rest breaks given when fatigue noted by PT and exercise form/technique broke down. Pt needed CGA-minA throughout session for safety, ~30% of LOB needing PT assist to correct. The patient would continue to benefit from further skilled PT intervention to address limitations in functional activities and improve safety and independence.    PT Education - 02/28/20 1040    Education Details exercise purpose/form, posture    Person(s) Educated Patient    Methods Explanation;Demonstration;Tactile cues    Comprehension Verbalized understanding;Returned demonstration;Verbal cues required;Tactile cues required;Need further instruction            PT Short Term Goals - 02/22/20 1757      PT SHORT TERM GOAL #1   Title Patient/family indepndent with initial home exercise program for self-management of symptoms.    Baseline to be established as safe and appropriate visit 2 (02/08/2020);    Time 2    Period Weeks    Status On-going    Target Date 02/23/20             PT Long Term Goals - 02/09/20 1209      PT LONG TERM GOAL #1   Title Family/patient is independent with a long-term home  exercise program for self-management of symptoms.    Baseline to be established as safe and appropriate visit 2 (02/08/2020);    Time 12    Period Weeks    Status New    Target Date --   TARGET DATE FOR ALL LONG TERM GOALS: 05/02/2020     PT LONG TERM GOAL #2   Title Patient will score equal or more than 25/30 on Functional Gait Assessment to demonstrate low fall risk.    Baseline to be tested visit 2 (02/08/2020);    Time 12    Period Weeks    Status New      PT LONG TERM GOAL #3   Title Patient will demonstrate the ability to complete sequential tapping steps on 8 inch step independently to demonstrate improved ability to navigate around objects without falling.    Baseline required support and repeated instructions (02/08/2020);    Time 12    Period Weeks  Status New      PT LONG TERM GOAL #4   Title Patient will demonstrated ability to complete Five Time Sit to Stand Test in equal or less than 12 seconds from 18.5 inch surface without UE support to demonstrate improved balance and safety with transfers.    Baseline 17 seconds from 18.5 inch plinth, initial hand use. . (02/08/2020);    Time 12    Period Weeks    Status New      PT LONG TERM GOAL #5   Title Patient will report less than 1 fall over 8 weeks to demonstrate improved safety with functional mobility.    Baseline at least 5 falls in the last 6 months (02/08/2020);    Time 12    Period Weeks    Status New                 Plan - 02/28/20 1041    Clinical Impression Statement The patient demonstrated great motivation during session. Several seated rest breaks given when fatigue noted by PT and exercise form/technique broke down. Pt needed CGA-minA throughout session for safety, ~30% of LOB needing PT assist to correct. The patient would continue to benefit from further skilled PT intervention to address limitations in functional activities and improve safety and independence.    Personal Factors and Comorbidities  Age;Time since onset of injury/illness/exacerbation;Behavior Pattern;Comorbidity 3+;Education;Social Background;Past/Current Experience;Fitness;Other    Comorbidities Relevant past medical history and comorbidities are extensive (see above) with particular relevance of cognitive impairment/decline, repeated L femur fracture, presence of AICD, low blood pressure, former smoker, MI with 5x bipass, ischemic cardiomyopathy, CHF, chronic Afib with recent need for cardioversion, neck pain, leg pain, DVT in left arm (recently cleared), cellulitis of L LE (recently treated).    Examination-Activity Limitations Carry;Stairs;Sit;Reach Overhead;Locomotion Level;Stand;Lift;Dressing;Bend;Transfers;Other    Examination-Participation Restrictions Community Activity;Cleaning;Meal Prep;Medication Management;Driving;Shop;Yard Work    [employer] Evolving/Moderate complexity    Rehab Potential Fair    PT Frequency 2x / week    PT Duration 12 weeks    PT Treatment/Interventions ADLs/Self Care Home Management;Cryotherapy;Canalith Repostioning;Moist Heat;Gait training;Stair training;Functional mobility training;Therapeutic activities;Therapeutic exercise;Balance training;Neuromuscular re-education;Cognitive remediation;Patient/family education;Manual techniques;Dry needling;Vestibular;Visual/perceptual remediation/compensation;Joint Manipulations;Spinal Manipulations    PT Next Visit Plan progressive balance training, HEP    PT Home Exercise Plan to be established as appropriate based on cognition and safety    Consulted and Agree with Plan of Care Patient           Patient will benefit from skilled therapeutic intervention in order to improve the following deficits and impairments:  Abnormal gait, Decreased cognition, Decreased knowledge of use of DME, Dizziness, Improper body mechanics, Pain, Impaired tone, Decreased coordination, Decreased mobility, Decreased activity tolerance, Decreased  endurance, Difficulty walking, Decreased safety awareness, Decreased knowledge of precautions, Decreased balance  Visit Diagnosis: Difficulty in walking, not elsewhere classified  Unsteadiness on feet  Dizziness and giddiness     Problem List Patient Active Problem List   Diagnosis Date Noted  . Persistent atrial fibrillation ([place]) 11/30/2019  . Secondary hypercoagulable state ([place]) 11/30/2019  . Arm DVT (deep venous thromboembolism), acute, left ([place]) 11/26/2019  . Hx of adenomatous colonic polyps 05/18/2019  . Cirrhosis, non-alcoholic ([place]) 23/76/2831  . Occult blood positive stool 05/12/2019  . HCAP (healthcare-associated pneumonia) 03/04/2019  . Hallucinations 02/10/2019  . Elevated blood sugar 01/27/2019  . S/P mitral valve repair 12/31/2018  . Bilateral pulmonary contusion 10/17/2018  . Cardiac LV ejection fraction of 20-34% 10/17/2018  . Chronic systolic CHF (  congestive heart failure) ([place]) 10/17/2018  . History of ischemic cardiomyopathy 10/17/2018  . ICD (implantable cardioverter-defibrillator) in place 10/17/2018  . S/p left hip fracture 10/17/2018  . Traumatic retroperitoneal hematoma 10/17/2018  . Age-related osteoporosis with current pathological fracture with routine healing 07/28/2018  . CAD (coronary artery disease) 07/21/2018  . GERD (gastroesophageal reflux disease) 07/21/2018  . Depression 07/21/2018  . CKD (chronic kidney disease), stage III 07/21/2018  . Iron deficiency anemia 07/21/2018  . Hypokalemia 07/21/2018  . Closed left hip fracture ([place]) 07/21/2018  . Fracture of femoral neck, left ([place]) 07/21/2018  . Lower extremity pain, bilateral 11/12/2017  . 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 Hyperlipidemia 09/01/2017  . Essential hypertension 09/01/2017  . PAD (peripheral artery disease) ([place]) 09/01/2017  . Anemia, unspecified 08/06/2017  . GAD (generalized anxiety disorder) 06/18/2017  . Prostate cancer screening 04/28/2017   . Hydrocele 04/28/2017  . Bradycardia   . Premature ventricular contraction   . Severe mitral insufficiency   . Cardiomyopathy, ischemic 04/26/2015  . Difficulty hearing 12/29/2014  . Acute on chronic systolic CHF (congestive heart failure) ([place]) 10/10/2014  . 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 Protein-calorie malnutrition, severe ([place]) 07/29/2014  . AKI (acute kidney injury) ([place]) 07/29/2014    Lieutenant Diego PT, DPT 10:46 AM,02/28/20   [facility] [address] [place], [address] Phone: [phone]   Fax:  [phone]  Name: FARAAZ WOLIN MRN: [id number] Date of Birth: 01-11-1951

## 2020-02-29 ENCOUNTER — Encounter: Payer: Self-pay | Admitting: Oncology

## 2020-02-29 ENCOUNTER — Inpatient Hospital Stay (HOSPITAL_BASED_OUTPATIENT_CLINIC_OR_DEPARTMENT_OTHER): Payer: Medicare Other | Admitting: Oncology

## 2020-02-29 ENCOUNTER — Inpatient Hospital Stay: Payer: Medicare Other

## 2020-02-29 VITALS — BP 106/79 | HR 69 | Resp 16

## 2020-02-29 VITALS — BP 104/66 | HR 67 | Temp 97.6°F | Resp 16 | Wt 168.4 lb

## 2020-02-29 DIAGNOSIS — D509 Iron deficiency anemia, unspecified: Secondary | ICD-10-CM | POA: Diagnosis not present

## 2020-02-29 DIAGNOSIS — D631 Anemia in chronic kidney disease: Secondary | ICD-10-CM

## 2020-02-29 DIAGNOSIS — N1831 Chronic kidney disease, stage 3a: Secondary | ICD-10-CM | POA: Diagnosis not present

## 2020-02-29 MED ORDER — IRON SUCROSE 20 MG/ML IV SOLN
200.0000 mg | Freq: Once | INTRAVENOUS | Status: AC
  Administered 2020-02-29: 200 mg via INTRAVENOUS
  Filled 2020-02-29: qty 10

## 2020-02-29 MED ORDER — SODIUM CHLORIDE 0.9 % IV SOLN
INTRAVENOUS | Status: DC
  Filled 2020-02-29: qty 250

## 2020-02-29 NOTE — Progress Notes (Signed)
Patient denies new problems/concerns today.   °

## 2020-02-29 NOTE — Progress Notes (Signed)
Hematology/Oncology follow up  note [facility] Telephone:([phone] [phone]) [phone]   Patient Care Team: [name], MD as PCP - General (Internal Medicine)  REFERRING PROVIDER: [name], MD  CHIEF COMPLAINTS/REASON FOR VISIT:  Follow-up for anemia  HISTORY OF PRESENTING ILLNESS:  Carl [name] is a  69 y.o.  male with PMH listed below who was referred to me for evaluation of anemia Reviewed patient's recent labs that was done.  05/25/2019 Labs revealed anemia with hemoglobin of 8.8 Reviewed patient's previous labs  anemia is chronic onset , duration is since 2016 No aggravating or improving factors.  Associated signs and symptoms: Patient reports fatigue. denies SOB with exertion. He is a poor historian Denies weight loss, easy bruising, hematochezia, hemoptysis, hematuria. Context: History of GI bleeding: denies               History of Chronic kidney disease                               Last colonoscopy: 2018 colonoscopy showed one small polyp in the sigmoid colon, removed with a hot snare. Resected and retrieved. bleeding colonic angioectasia. Treated with argon plasma coagulation (APC). -EGD 2018 showed esophagitis.   Lives by himself. Family sister/brother bring him food.  #  CHF, LVEF 20 to 25%, history of MV repair in May 2020.  On Plavix.  And aspirin.  INTERVAL HISTORY Carl [name] is a 69 y.o. male who has above history reviewed by me today presents for follow up visit for management of Anemia Problems and complaints are listed below: Patient is a poor historian.  He has no new complaints. He reports fatigue has improved He is accompanied by sister [name]. Review of Systems  Constitutional: Negative for appetite change, chills, fatigue, fever and unexpected weight change.  HENT:   Negative for hearing loss, nosebleeds and voice change.   Eyes: Negative for eye problems and icterus.  Respiratory: Negative for chest  tightness, cough and shortness of breath.   Cardiovascular: Negative for chest pain and leg swelling.  Gastrointestinal: Negative for abdominal distention and abdominal pain.  Endocrine: Negative for hot flashes.  Genitourinary: Negative for difficulty urinating, dysuria, frequency and hematuria.   Musculoskeletal: Negative for arthralgias.  Skin: Negative for itching and rash.  Neurological: Negative for light-headedness and numbness.  Hematological: Negative for adenopathy. Does not bruise/bleed easily.  Psychiatric/Behavioral: Negative for confusion. The patient is not nervous/anxious.      MEDICAL HISTORY:  Past Medical History:  Diagnosis Date  . AICD (automatic cardioverter/defibrillator) present   . Anemia   . Anxiety   . Arthritis   . Brunner's gland hyperplasia of duodenum   . CHF (congestive heart failure) ([place])   . Coronary artery disease   . External hemorrhoids   . Fracture of femoral neck, left ([place]) 07/21/2018  . GERD (gastroesophageal reflux disease)   . Hearing loss   . HH (hiatus hernia)   . Internal hemorrhoids   . Ischemic cardiomyopathy   . Leg fracture, left   . Myocardial infarction ([place])   . Paronychia   . Pneumonia   . Psoriasis   . PUD (peptic ulcer disease)   . Schatzki's ring   . Tubular adenoma of colon   . Vitiligo     SURGICAL HISTORY: Past Surgical History:  Procedure Laterality Date  . ATRIAL SEPTAL DEFECT(ASD) CLOSURE N/A 12/30/2018   Procedure: ATRIAL SEPTAL DEFECT(ASD) CLOSURE;  Surgeon: [name], MD;  Location: [place] [facility];  Service: Cardiovascular;  Laterality: N/A;  . CARDIAC SURGERY    . CARDIOVERSION N/A 11/09/2019   Procedure: CARDIOVERSION;  Surgeon: [name], MD;  Location: [facility] ENDOSCOPY;  Service: Cardiovascular;  Laterality: N/A;  . CARDIOVERSION N/A 12/21/2019   Procedure: CARDIOVERSION;  Surgeon: [name], MD;  Location: [facility] ENDOSCOPY;  Service: Cardiovascular;  Laterality: N/A;  . COLONOSCOPY  WITH ESOPHAGOGASTRODUODENOSCOPY (EGD)    . COLONOSCOPY WITH PROPOFOL N/A 08/24/2017   Procedure: COLONOSCOPY WITH PROPOFOL;  Surgeon: [name], MD;  Location: [facility] ENDOSCOPY;  Service: Endoscopy;  Laterality: N/A;  . CORONARY ARTERY BYPASS GRAFT N/A 08/03/2014   Procedure: CORONARY ARTERY BYPASS GRAFTING (CABG);  Surgeon: [name], MD;  Location: [place];  Service: Open Heart Surgery;  Laterality: N/A;  . EP IMPLANTABLE DEVICE N/A 05/01/2015   MDT ICD implanted for primary prevention of sudden death  . ESOPHAGOGASTRODUODENOSCOPY (EGD) WITH PROPOFOL N/A 04/16/2015   Procedure: ESOPHAGOGASTRODUODENOSCOPY (EGD) WITH PROPOFOL;  Surgeon: [name], MD;  Location: [facility] ENDOSCOPY;  Service: Endoscopy;  Laterality: N/A;  . ESOPHAGOGASTRODUODENOSCOPY (EGD) WITH PROPOFOL N/A 08/24/2017   Procedure: ESOPHAGOGASTRODUODENOSCOPY (EGD) WITH PROPOFOL;  Surgeon: [name], MD;  Location: [facility] ENDOSCOPY;  Service: Endoscopy;  Laterality: N/A;  . FRACTURE SURGERY    . HERNIA REPAIR    . HIP PINNING,CANNULATED Left 07/22/2018   Procedure: CANNULATED HIP PINNING;  Surgeon: [name], MD;  Location: [place];  Service: Orthopedics;  Laterality: Left;  . MITRAL VALVE REPAIR N/A 12/30/2018   Procedure: MITRAL VALVE REPAIR;  Surgeon: [name], MD;  Location: [place] [facility];  Service: Cardiovascular;  Laterality: N/A;  . RIGHT/LEFT HEART CATH AND CORONARY/GRAFT ANGIOGRAPHY N/A 12/21/2018   Procedure: RIGHT/LEFT HEART CATH AND CORONARY/GRAFT ANGIOGRAPHY;  Surgeon: [name], MD;  Location: [place] [facility];  Service: Cardiovascular;  Laterality: N/A;  . TEE WITHOUT CARDIOVERSION N/A 08/03/2014   Procedure: TRANSESOPHAGEAL ECHOCARDIOGRAM (TEE);  Surgeon: [name], MD;  Location: [place];  Service: Open Heart Surgery;  Laterality: N/A;  . TEE WITHOUT CARDIOVERSION N/A 02/10/2018   Procedure: TRANSESOPHAGEAL ECHOCARDIOGRAM (TEE);  Surgeon: [name], MD;  Location: [facility]  ENDOSCOPY;  Service: Cardiovascular;  Laterality: N/A;  . TEE WITHOUT CARDIOVERSION N/A 11/09/2019   Procedure: TRANSESOPHAGEAL ECHOCARDIOGRAM (TEE);  Surgeon: [name], MD;  Location: [facility] ENDOSCOPY;  Service: Cardiovascular;  Laterality: N/A;    SOCIAL HISTORY: Social History   Socioeconomic History  . Marital status: Single    Spouse name: Not on file  . Number of children: Not on file  . Years of education: Not on file  . Highest education level: Not on file  Occupational History  . Not on file  Tobacco Use  . Smoking status: Former Smoker    Quit date: 2014    Years since quitting: 7.5  . Smokeless tobacco: Never Used  . Tobacco comment: 07/30/2017 Quit in 2014  Vaping Use  . Vaping Use: Never used  Substance and Sexual Activity  . Alcohol use: No    Alcohol/week: 0.0 standard drinks  . Drug use: No  . Sexual activity: Never  Other Topics Concern  . Not on file  Social History Narrative   Pt lives in [place] with his mother.   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 Difficulty of Paying Living Expenses:   Food Insecurity:   .  Worried About Charity fundraiser in the Last Year:   . Arboriculturist in the Last Year:   Transportation Needs:   . Film/video editor (Medical):   Marland Kitchen Lack of Transportation (Non-Medical):   Physical Activity:   . Days of Exercise per Week:   . Minutes of Exercise per Session:   Stress:   . Feeling of Stress :   Social Connections:   . Frequency of Communication with Friends and Family:   . Frequency of Social Gatherings with Friends and Family:   . Attends Religious Services:   . Active Member of Clubs or Organizations:   . Attends Archivist Meetings:   Marland Kitchen Marital Status:   Intimate Partner Violence:   . Fear of Current or Ex-Partner:   . Emotionally Abused:   Marland Kitchen Physically Abused:   . Sexually Abused:     FAMILY HISTORY: Family History    Problem Relation Age of Onset  . Valvular heart disease Mother        Ruptured valve  . CAD Father   . Heart Problems Brother        Stents x 4  . Diabetes Brother   . Prostate cancer Neg Hx   . Bladder Cancer Neg Hx   . Kidney cancer Neg Hx     ALLERGIES:  is allergic to spironolactone.  MEDICATIONS:  Current Outpatient Medications  Medication Sig Dispense Refill  . acetaminophen (TYLENOL) 500 MG tablet Take 500-1,000 mg by mouth every 6 (six) hours as needed (for pain.).    Marland Kitchen amiodarone (PACERONE) 200 MG tablet Take 1 tablet (200 mg total) by mouth daily. 90 tablet 1  . apixaban (ELIQUIS) 5 MG TABS tablet Take 1 tablet (5 mg total) by mouth 2 (two) times daily. 60 tablet 5  . Ascorbic Acid (VITAMIN C) 100 MG tablet Take 100 mg by mouth daily.    . carvedilol (COREG) 6.25 MG tablet Take 1 tablet (6.25 mg total) by mouth 2 (two) times daily. 180 tablet 3  . Cholecalciferol (VITAMIN D3 SUPER STRENGTH) 50 MCG (2000 UT) TABS Take 2,000 Units by mouth at bedtime.     . cyanocobalamin 500 MCG tablet Take 500 mcg by mouth at bedtime.     . DULoxetine (CYMBALTA) 30 MG capsule Take 30 mg by mouth every evening.     Marland Kitchen eplerenone (INSPRA) 50 MG tablet TAKE 1 TABLET BY MOUTH EVERY DAY (Patient taking differently: Take 50 mg by mouth daily. ) 90 tablet 2  . ferrous sulfate 325 (65 FE) MG tablet Take 1 tablet (325 mg total) by mouth daily with breakfast. (Patient taking differently: Take 325 mg by mouth daily. ) 30 tablet 3  . gabapentin (NEURONTIN) 300 MG capsule Take 300 mg by mouth at bedtime.     Marland Kitchen ketoconazole (NIZORAL) 2 % shampoo Apply 1 application topically 2 (two) times a week.     . metolazone (ZAROXOLYN) 2.5 MG tablet TAKE 1 TAB BY MOUTH ONCE A WEEK. WEDNESDAY MORNINGS. TAKE 1 EXTRA POTASSIUM TABLET WITH THIS (Patient taking differently: Take 2.5 mg by mouth every Wednesday. Morning) 5 tablet 11  . Multiple Vitamin (MULTIVITAMIN) tablet Take 1 tablet by mouth at bedtime.     .  pantoprazole (PROTONIX) 40 MG tablet TAKE 1 TABLET BY MOUTH EVERY DAY 90 tablet 2  . potassium chloride SA (KLOR-CON M20) 20 MEQ tablet Take 52meq every  AM and 49meq every pm. Take an extra 65meq every Wednesday AM with  Metolazone. 100 tablet 3  . torsemide (DEMADEX) 20 MG tablet Take 3 tablets (60 mg total) by mouth 2 (two) times daily. 540 tablet 0  . allopurinol (ZYLOPRIM) 100 MG tablet Take 100 mg by mouth daily. (Patient not taking: Reported on 02/29/2020)    . atorvastatin (LIPITOR) 40 MG tablet TAKE 1 TABLET (40 MG TOTAL) BY MOUTH DAILY AT 6 PM. (Patient taking differently: Take 40 mg by mouth at bedtime. ) 90 tablet 3  . cephALEXin (KEFLEX) 500 MG capsule Take 500 mg by mouth 3 (three) times daily. (Patient not taking: Reported on 02/13/2020)    . clopidogrel (PLAVIX) 75 MG tablet TAKE 1 TABLET (75 MG TOTAL) BY MOUTH DAILY WITH BREAKFAST. (Patient not taking: Reported on 02/21/2020)    . losartan (COZAAR) 25 MG tablet Take by mouth. (Patient not taking: Reported on 02/13/2020)     No current facility-administered medications for this visit.   Facility-Administered Medications Ordered in Other Visits  Medication Dose Route Frequency Provider Last Rate Last Admin  . 0.9 %  sodium chloride infusion   Intravenous Continuous Earlie Server, MD   Stopped at 02/29/20 1441     PHYSICAL EXAMINATION: ECOG PERFORMANCE STATUS: 2 - Symptomatic, <50% confined to bed Vitals:   02/29/20 1323  BP: 104/66  Pulse: 67  Resp: 16  Temp: 97.6 F (36.4 C)   Filed Weights   02/29/20 1323  Weight: 168 lb 6.4 oz (76.4 kg)    Physical Exam Constitutional:      General: He is not in acute distress.    Appearance: He is not ill-appearing.     Comments: Patient walks independently  HENT:     Head: Normocephalic and atraumatic.  Eyes:     General: No scleral icterus.    Pupils: Pupils are equal, round, and reactive to light.  Cardiovascular:     Rate and Rhythm: Normal rate and regular rhythm.     Heart  sounds: Normal heart sounds.  Pulmonary:     Effort: Pulmonary effort is normal. No respiratory distress.     Breath sounds: Normal breath sounds. No wheezing.  Abdominal:     General: Bowel sounds are normal. There is no distension.     Palpations: Abdomen is soft. There is no mass.     Tenderness: There is no abdominal tenderness.  Musculoskeletal:        General: No swelling or deformity. Normal range of motion.     Cervical back: Normal range of motion and neck supple.  Skin:    General: Skin is warm and dry.     Findings: No erythema or rash.  Neurological:     General: No focal deficit present.     Mental Status: He is alert. Mental status is at baseline.     Cranial Nerves: No cranial nerve deficit.     Coordination: Coordination normal.      LABORATORY DATA:  I have reviewed the data as listed Lab Results  Component Value Date   WBC 6.6 02/28/2020   HGB 12.2 (L) 02/28/2020   HCT 36.1 (L) 02/28/2020   MCV 96.0 02/28/2020   PLT 192 02/28/2020   Recent Labs    04/14/19 1628 05/24/19 1422 11/01/19 1457 11/09/19 0924 12/19/19 1357 12/21/19 1021 01/12/20 1445  NA 129*   < > 135   < > 128* 131* 129*  K 3.7   < > 4.3   < > 4.2 4.6 3.6  CL 91*   < > 95*   < >  88* 90* 86*  CO2 27   < > 29   < > 30 29 28   GLUCOSE 118*   < > 103*   < > 104* 124* 103*  BUN 24*   < > 28*   < > 35* 31* 64*  CREATININE 1.44*   < > 1.86*   < > 2.04* 2.04* 2.00*  CALCIUM 9.0   < > 9.3   < > 9.0 9.5 9.8  GFRNONAA 50*   < > 36*   < > 33* 33* 33*  GFRAA 58*   < > 42*   < > 38* 38* 39*  PROT 7.3  --  7.3  --   --   --  7.8  ALBUMIN 4.0  --  4.0  --   --   --  3.8  AST 32  --  25  --   --   --  25  ALT 21  --  16  --   --   --  19  ALKPHOS 77  --  130*  --   --   --  129*  BILITOT 2.1*  --  1.2  --   --   --  1.5*  BILIDIR  --   --   --   --   --   --  0.3*  IBILI  --   --   --   --   --   --  1.2*   < > = values in this interval not displayed.   Iron/TIBC/Ferritin/ %Sat    Component  Value Date/Time   IRON 53 02/28/2020 1337   TIBC 405 02/28/2020 1337   FERRITIN 22 (L) 02/28/2020 1337   IRONPCTSAT 13 (L) 02/28/2020 1337     RADIOGRAPHIC STUDIES: I have personally reviewed the radiological images as listed and agreed with the findings in the report. US Venous Img Lower Unilateral Left (DVT)  Result Date: 01/02/2020 CLINICAL DATA:  Redness and swelling of the leg. EXAM: RIGHT LOWER EXTREMITY VENOUS DOPPLER ULTRASOUND TECHNIQUE: Gray-scale sonography with compression, as well as color and duplex ultrasound, were performed to evaluate the deep venous system(s) from the level of the common femoral vein through the popliteal and proximal calf veins. COMPARISON:  None. FINDINGS: VENOUS Normal compressibility of the common femoral, superficial femoral, and popliteal veins, as well as the visualized calf veins. Visualized portions of profunda femoral vein and great saphenous vein unremarkable. No filling defects to suggest DVT on grayscale or color Doppler imaging. Doppler waveforms show normal direction of venous flow, normal respiratory plasticity and response to augmentation. Limited views of the contralateral common femoral vein are unremarkable. OTHER None. Limitations: none IMPRESSION: Negative. Electronically Signed   By: Constance Holster M.D.   On: 01/02/2020 16:40   US Venous Img Lower Unilateral Right (DVT)  Result Date: 12/07/2019 CLINICAL DATA:  Right leg pain and swelling EXAM: RIGHT LOWER EXTREMITY VENOUS DOPPLER ULTRASOUND TECHNIQUE: Gray-scale sonography with graded compression, as well as color Doppler and duplex ultrasound were performed to evaluate the lower extremity deep venous systems from the level of the common femoral vein and including the common femoral, femoral, profunda femoral, popliteal and calf veins including the posterior tibial, peroneal and gastrocnemius veins when visible. The superficial great saphenous vein was also interrogated. Spectral Doppler  was utilized to evaluate flow at rest and with distal augmentation maneuvers in the common femoral, femoral and popliteal veins. COMPARISON:  None. FINDINGS: Contralateral Common Femoral Vein: Respiratory phasicity is  normal and symmetric with the symptomatic side. No evidence of thrombus. Normal compressibility. Common Femoral Vein: No evidence of thrombus. Normal compressibility, respiratory phasicity and response to augmentation. Saphenofemoral Junction: No evidence of thrombus. Normal compressibility and flow on color Doppler imaging. Profunda Femoral Vein: No evidence of thrombus. Normal compressibility and flow on color Doppler imaging. Femoral Vein: No evidence of thrombus. Normal compressibility, respiratory phasicity and response to augmentation. Popliteal Vein: No evidence of thrombus. Normal compressibility, respiratory phasicity and response to augmentation. Calf Veins: No evidence of thrombus. Normal compressibility and flow on color Doppler imaging. IMPRESSION: No evidence of deep venous thrombosis. Electronically Signed   By: Jerilynn Mages.  Shick M.D.   On: 12/07/2019 16:09   VAS [place] ABI WITH/WO TBI  Result Date: 01/30/2020 LOWER EXTREMITY DOPPLER STUDY  Comparison Study: 10/15/2019 Performing Technologist: Charlane Ferretti RT (R)(VS)  Examination Guidelines: A complete evaluation includes at minimum, Doppler waveform signals and systolic blood pressure reading at the level of bilateral brachial, anterior tibial, and posterior tibial arteries, when vessel segments are accessible. Bilateral testing is considered an integral part of a complete examination. Photoelectric Plethysmograph (PPG) waveforms and toe systolic pressure readings are included as required and additional duplex testing as needed. Limited examinations for reoccurring indications may be performed as noted.  ABI Findings: +---------+------------------+-----+---------+--------+ Right    Rt Pressure (mmHg)IndexWaveform Comment   +---------+------------------+-----+---------+--------+ Brachial 124                                      +---------+------------------+-----+---------+--------+ ATA      145               1.17 triphasic         +---------+------------------+-----+---------+--------+ PTA      151               1.22 triphasic         +---------+------------------+-----+---------+--------+ Great Toe103               0.83 Normal            +---------+------------------+-----+---------+--------+ +---------+------------------+-----+---------+--------------------------------+ Left     Lt Pressure (mmHg)IndexWaveform Comment                          +---------+------------------+-----+---------+--------------------------------+ Brachial                                 Patient states he has a blood                                             clot                             +---------+------------------+-----+---------+--------------------------------+ ATA      142               1.15 triphasic                                 +---------+------------------+-----+---------+--------------------------------+ PTA      156               1.26 triphasic                                 +---------+------------------+-----+---------+--------------------------------+  Great Toe114               0.92 Normal                                    +---------+------------------+-----+---------+--------------------------------+ +-------+-----------+-----------+------------+------------+ ABI/TBIToday's ABIToday's TBIPrevious ABIPrevious TBI +-------+-----------+-----------+------------+------------+ Right  1.22       .83        1.12        .79          +-------+-----------+-----------+------------+------------+ Left   1.26       .92        1.23        .73          +-------+-----------+-----------+------------+------------+ Bilateral ABIs appear essentially unchanged compared to prior  study on 10/15/2019. Bilateral TBIs appear essentially unchanged compared to prior study on 10/15/2019.  Summary:  *See table(s) above for measurements and observations.  Electronically signed by Hortencia Pilar MD on 01/30/2020 at 4:49:00 PM.   Final    CUP PACEART REMOTE DEVICE CHECK  Result Date: 01/26/2020 Scheduled remote reviewed. Normal device function.  Next remote 91 days. Kathy Breach, RN, CCDS, [employer]  VAS [place] UPPER EXTREMITY VENOUS DUPLEX  Result Date: 01/26/2020 UPPER VENOUS STUDY  Indications: Left Brachial vein non occlusive thrombus Comparison Study: 11/20/2019 Performing Technologist: Charlane Ferretti RT (R)(VS)  Examination Guidelines: A complete evaluation includes B-mode imaging, spectral Doppler, color Doppler, and power Doppler as needed of all accessible portions of each vessel. Bilateral testing is considered an integral part of a complete examination. Limited examinations for reoccurring indications may be performed as noted.  Left Findings: +--------+------------+---------+-----------+----------+----------------------+ LEFT    CompressiblePhasicitySpontaneousProperties       Summary         +--------+------------+---------+-----------+----------+----------------------+ IJV         Full                                                         +--------+------------+---------+-----------+----------+----------------------+ Brachial    Full                                  Wall thickening at ACF +--------+------------+---------+-----------+----------+----------------------+ Cephalic    Full                                                         +--------+------------+---------+-----------+----------+----------------------+ Basilic   Partial                                                        +--------+------------+---------+-----------+----------+----------------------+  Summary:  Left: Leftr brachial veins at the ACF appears compressable with  wall thickening and appears somewhat improved as compared to the previous exam on 11/20/2019.  *See table(s) above for measurements and observations.  Diagnosing physician: Hortencia Pilar MD Electronically signed by Hortencia Pilar MD on 01/26/2020 at 1:28:34 PM.  Final      ASSESSMENT & PLAN:  1. Anemia in stage 3a chronic kidney disease   2. Iron deficiency anemia, unspecified iron deficiency anemia type    #Lab results were reviewed and discussed with patient.  Iron deficiency anemia, Hemoglobin showed improvement to 12.2, ferritin 22, iron saturation 13, this is compatible with mild iron deficiency anemia. Recommend to proceed with IV Venofer today. I believe he also has a component of anemia secondary to chronic kidney disease.  In the context of CKD, I will repeat another Venofer treatments in 2 weeks to further improve his iron store.  Etiology of iron deficiency, likely secondary to chronic GI blood loss. 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  I recommend patient to continue follow-up with gastroenterology to see if there is any need for him to repeat EGD and colonoscopy. Follow-up in 4 months  Orders Placed This Encounter  Procedures  . CBC with Differential/Platelet    Standing Status:   Future    Standing Expiration Date:   02/28/2021  . Ferritin    Standing Status:   Future    Standing Expiration Date:   02/28/2021  . Iron and TIBC    Standing Status:   Future    Standing Expiration Date:   02/28/2021    All questions were answered. The patient knows to call the clinic with any problems questions or concerns. Ollen Gross, MD   Earlie Server, MD, PhD 02/29/2020

## 2020-03-01 ENCOUNTER — Ambulatory Visit: Payer: Medicare Other | Admitting: Physical Therapy

## 2020-03-01 ENCOUNTER — Encounter: Payer: Self-pay | Admitting: Physical Therapy

## 2020-03-01 ENCOUNTER — Encounter: Payer: Medicare Other | Admitting: Physical Therapy

## 2020-03-01 ENCOUNTER — Ambulatory Visit (INDEPENDENT_AMBULATORY_CARE_PROVIDER_SITE_OTHER): Payer: Medicare Other

## 2020-03-01 ENCOUNTER — Other Ambulatory Visit: Payer: Self-pay

## 2020-03-01 DIAGNOSIS — R2681 Unsteadiness on feet: Secondary | ICD-10-CM

## 2020-03-01 DIAGNOSIS — Z95810 Presence of automatic (implantable) cardiac defibrillator: Secondary | ICD-10-CM

## 2020-03-01 DIAGNOSIS — R262 Difficulty in walking, not elsewhere classified: Secondary | ICD-10-CM

## 2020-03-01 DIAGNOSIS — I5022 Chronic systolic (congestive) heart failure: Secondary | ICD-10-CM

## 2020-03-01 DIAGNOSIS — R42 Dizziness and giddiness: Secondary | ICD-10-CM

## 2020-03-01 NOTE — Therapy (Signed)
[facility] [address], [place], [address] Phone: [phone]   Fax:  [phone]  Physical Therapy Treatment  Patient Details  Name: Carl [name] MRN: [id number] Date of Birth: March 07, 1951 Referring Provider (PT): [name], NP   Encounter Date: 03/01/2020   PT End of Session - 03/01/20 1058    Visit Number 7    Number of Visits 24    Date for PT Re-Evaluation 05/02/20    Authorization Type [employer] Medicare Reporting period from 02/08/2020    Authorization - Visit Number 7    Authorization - Number of Visits 10    Progress Note Due on Visit 10    PT Start Time 0950    PT Stop Time 1030    PT Time Calculation (min) 40 min    Equipment Utilized During Treatment Gait belt    Activity Tolerance Patient tolerated treatment well    Behavior During Therapy [facility] for tasks assessed/performed           Past Medical History:  Diagnosis Date  . AICD (automatic cardioverter/defibrillator) present   . Anemia   . Anxiety   . Arthritis   . Brunner's gland hyperplasia of duodenum   . CHF (congestive heart failure) ([place])   . Coronary artery disease   . External hemorrhoids   . Fracture of femoral neck, left ([place]) 07/21/2018  . GERD (gastroesophageal reflux disease)   . Hearing loss   . HH (hiatus hernia)   . Internal hemorrhoids   . Ischemic cardiomyopathy   . Leg fracture, left   . Myocardial infarction ([place])   . Paronychia   . Pneumonia   . Psoriasis   . PUD (peptic ulcer disease)   . Schatzki's ring   . Tubular adenoma of colon   . Vitiligo     Past Surgical History:  Procedure Laterality Date  . ATRIAL SEPTAL DEFECT(ASD) CLOSURE N/A 12/30/2018   Procedure: ATRIAL SEPTAL DEFECT(ASD) CLOSURE;  Surgeon: [name], MD;  Location: [place] [facility];  Service: Cardiovascular;  Laterality: N/A;  . CARDIAC SURGERY    . CARDIOVERSION N/A 11/09/2019   Procedure: CARDIOVERSION;  Surgeon: [name], MD;  Location: [facility] ENDOSCOPY;  Service: Cardiovascular;  Laterality: N/A;  . CARDIOVERSION N/A 12/21/2019   Procedure: CARDIOVERSION;  Surgeon: [name], MD;  Location: [facility] ENDOSCOPY;  Service: Cardiovascular;  Laterality: N/A;  . COLONOSCOPY WITH ESOPHAGOGASTRODUODENOSCOPY (EGD)    . COLONOSCOPY WITH PROPOFOL N/A 08/24/2017   Procedure: COLONOSCOPY WITH PROPOFOL;  Surgeon: [name], MD;  Location: [facility] ENDOSCOPY;  Service: Endoscopy;  Laterality: N/A;  . CORONARY ARTERY BYPASS GRAFT N/A 08/03/2014   Procedure: CORONARY ARTERY BYPASS GRAFTING (CABG);  Surgeon: [name], MD;  Location: [place];  Service: Open Heart Surgery;  Laterality: N/A;  . EP IMPLANTABLE DEVICE N/A 05/01/2015   MDT ICD implanted for primary prevention of sudden death  . ESOPHAGOGASTRODUODENOSCOPY (EGD) WITH PROPOFOL N/A 04/16/2015   Procedure: ESOPHAGOGASTRODUODENOSCOPY (EGD) WITH PROPOFOL;  Surgeon: [name], MD;  Location: [facility] ENDOSCOPY;  Service: Endoscopy;  Laterality: N/A;  . ESOPHAGOGASTRODUODENOSCOPY (EGD) WITH PROPOFOL N/A 08/24/2017   Procedure: ESOPHAGOGASTRODUODENOSCOPY (EGD) WITH PROPOFOL;  Surgeon: [name], MD;  Location: [facility] ENDOSCOPY;  Service: Endoscopy;  Laterality: N/A;  . FRACTURE SURGERY    . HERNIA REPAIR    . HIP PINNING,CANNULATED Left 07/22/2018   Procedure: CANNULATED HIP PINNING;  Surgeon: [name], MD;  Location: [facility]  OR;  Service: Orthopedics;  Laterality: Left;  . MITRAL VALVE REPAIR N/A 12/30/2018   Procedure: MITRAL VALVE REPAIR;  Surgeon: [name], MD;  Location: [place] [facility];  Service: Cardiovascular;  Laterality: N/A;  . RIGHT/LEFT HEART CATH AND CORONARY/GRAFT ANGIOGRAPHY N/A 12/21/2018   Procedure: RIGHT/LEFT HEART CATH AND CORONARY/GRAFT ANGIOGRAPHY;  Surgeon: [name], MD;  Location: [place] [facility];  Service: Cardiovascular;  Laterality: N/A;  . TEE WITHOUT CARDIOVERSION N/A 08/03/2014   Procedure: TRANSESOPHAGEAL  ECHOCARDIOGRAM (TEE);  Surgeon: [name], MD;  Location: [facility];  Service: Open Heart Surgery;  Laterality: N/A;  . TEE WITHOUT CARDIOVERSION N/A 02/10/2018   Procedure: TRANSESOPHAGEAL ECHOCARDIOGRAM (TEE);  Surgeon: [name], MD;  Location: [facility] ENDOSCOPY;  Service: Cardiovascular;  Laterality: N/A;  . TEE WITHOUT CARDIOVERSION N/A 11/09/2019   Procedure: TRANSESOPHAGEAL ECHOCARDIOGRAM (TEE);  Surgeon: [name], MD;  Location: [facility] ENDOSCOPY;  Service: Cardiovascular;  Laterality: N/A;    There were no vitals filed for this visit.   Subjective Assessment - 03/01/20 1057    Subjective Patient reports he feels well today. His sister dropped him off today. He is using his [facility] and denies falls since last session.    Patient is accompained by: Family member    Pertinent History Patient is a 69 y.o. male who presents to outpatient physical therapy with a referral for medical diagnosis unsteadiness on feet, dizziness and giddiness. This patient's chief complaints consist of feeling unsteady on feet and occasional falling leading to the following functional deficits: difficulty with basic mobility including household and community ambulation, frequent injuries and pain from falling, completing ADLs, IADLs, safety and quality of life. Relevant past medical history and comorbidities are extensive (see above) with particular relevance of cognitive impairment/decline, repeated L femur fracture, presence of AICD, low blood pressure, former smoker, MI with 5x bipass, ischemic cardiomyopathy, CHF, chronic Afib with recent need for cardioversion, neck pain, leg pain, DVT in left arm (recently cleared), cellulitis of L LE (recently treated).  Patient denies hx of cancer, stroke, seizures, lung problem, diabetes, unexplained weight loss.    Limitations Lifting;Standing;Walking;House hold activities    Diagnostic tests US venous Img Upper Uni Lft report 11/20/2019: "IMPRESSION:Nonocclusive thrombus within  the left brachial vein near theantecubital fossa" resolved per note on 5/27/2021CT Lumbar spine report 06/19/2020:"IMPRESSION:1. Healing right sacral ala fracture. This likely contributes to thepatient's right-sided hip pain.2. Dynamic listhesis with mild subarticular and foraminal stenosisbilaterally at L4-5. The stenosis is likely worse with standing andflexion.3. Mild right foraminal narrowing at L2-3.4. Broad-based disc protrusion at L2-3 and L3-4 extends into theforamina bilaterally both levels without other significant stenosis.5. Moderate left and mild right foraminal stenosis at L5-S1secondary to chronic broad-based disc protrusion and facethypertrophy. Mild left subarticular narrowing is present as well.6.  Aortic Atherosclerosis (ICD10-I70.0)."    Currently in Pain? No/denies           TREATMENT: Recent DVT in left elbow region High fall risk! Cognitive deficits  Neuromuscular Re-education:to improve, balance, postural strength, muscle activation patterns, and stabilization strength required for functional activities, SBA - min A provided for all standing tasks for safey:  Standing on Airex pad (compliant surface), Treadmill bar in front of patient for UE support as needed, encouraged no hands during activities, min A to prevent fall ~ 4 times:   - horizontal head turnsfollowing ball held in both hands,self-selected to narrow stance, 2x 20 - vertical head nodsfollowing ball held in both hands, self-selected to narrow stance,2x 20 -  eyes closed 2x1 min, self-selected to narrow stance  CGA- min A: - sit <> stand standing from airex from seat while holding a DB in both hands and pressing out overhead in front upon standing. From 23 inch surface (nustep at level 11) with 6# DB 1x 10, with 10# DB 2x10. Working on improving balance upon immediate standing, especially when tired.  Standing near TM bar for intermittant UE support/safety and min A to prevent occasional falls:  -  side stepping over two yoga blocks lined up to create a low barrier, 1x20 each way (with breaks to reset at times due to hitting them with feet). Required max cuing to improve step length to allow room for second foot. 40 bpm (one beat per step) - forward step over yoga block,with both feet then step back over yoga blocks with both feet. ~1x20 each side with pauses to reset knocked blocks.Metromone adjusted to 40 bpm with one step per beat. More difficult moving left foot forward.  - standing alternating double toe taps on cones, 1x20 each side with CGA - min A. cued for posture, activity pacing.  Pt required multimodal cuing for proper technique and to facilitate improved neuromuscular control, strength, range of motion, and functional ability resulting in improved performance and form.Seated rest breaks between exercises and some sets while clinician clearly demonstrated next task.     PT Education - 03/01/20 1058    Education Details exercise purpose/form, posture    Person(s) Educated Patient    Methods Explanation;Demonstration;Tactile cues;Verbal cues    Comprehension Verbalized understanding;Returned demonstration;Verbal cues required;Tactile cues required;Need further instruction            PT Short Term Goals - 02/22/20 1757      PT SHORT TERM GOAL #1   Title Patient/family indepndent with initial home exercise program for self-management of symptoms.    Baseline to be established as safe and appropriate visit 2 (02/08/2020);    Time 2    Period Weeks    Status On-going    Target Date 02/23/20             PT Long Term Goals - 02/09/20 1209      PT LONG TERM GOAL #1   Title Family/patient is independent with a long-term home exercise program for self-management of symptoms.    Baseline to be established as safe and appropriate visit 2 (02/08/2020);    Time 12    Period Weeks    Status New    Target Date --   TARGET DATE FOR ALL LONG TERM GOALS: 05/02/2020     PT  LONG TERM GOAL #2   Title Patient will score equal or more than 25/30 on Functional Gait Assessment to demonstrate low fall risk.    Baseline to be tested visit 2 (02/08/2020);    Time 12    Period Weeks    Status New      PT LONG TERM GOAL #3   Title Patient will demonstrate the ability to complete sequential tapping steps on 8 inch step independently to demonstrate improved ability to navigate around objects without falling.    Baseline required support and repeated instructions (02/08/2020);    Time 12    Period Weeks    Status New      PT LONG TERM GOAL #4   Title Patient will demonstrated ability to complete Five Time Sit to Stand Test in equal or less than 12 seconds from 18.5 inch surface without UE support to  demonstrate improved balance and safety with transfers.    Baseline 17 seconds from 18.5 inch plinth, initial hand use. . (02/08/2020);    Time 12    Period Weeks    Status New      PT LONG TERM GOAL #5   Title Patient will report less than 1 fall over 8 weeks to demonstrate improved safety with functional mobility.    Baseline at least 5 falls in the last 6 months (02/08/2020);    Time 12    Period Weeks    Status New                 Plan - 03/01/20 1057    Clinical Impression Statement Patient tolerated treatment well overall. Good participation and able to continue static and dynamic balance training without complaint of pain. Patient required close supervision to Min A to prevent falls with occasionally loss of balance. Patient would benefit from continued management of limiting condition by skilled physical therapist to address remaining impairments and functional limitations to work towards stated goals and return to PLOF or maximal functional independence.    Personal Factors and Comorbidities Age;Time since onset of injury/illness/exacerbation;Behavior Pattern;Comorbidity 3+;Education;Social Background;Past/Current Experience;Fitness;Other    Comorbidities  Relevant past medical history and comorbidities are extensive (see above) with particular relevance of cognitive impairment/decline, repeated L femur fracture, presence of AICD, low blood pressure, former smoker, MI with 5x bipass, ischemic cardiomyopathy, CHF, chronic Afib with recent need for cardioversion, neck pain, leg pain, DVT in left arm (recently cleared), cellulitis of L LE (recently treated).    Examination-Activity Limitations Carry;Stairs;Sit;Reach Overhead;Locomotion Level;Stand;Lift;Dressing;Bend;Transfers;Other    Examination-Participation Restrictions Community Activity;Cleaning;Meal Prep;Medication Management;Driving;Shop;Yard Work    [employer] Evolving/Moderate complexity    Rehab Potential Fair    PT Frequency 2x / week    PT Duration 12 weeks    PT Treatment/Interventions ADLs/Self Care Home Management;Cryotherapy;Canalith Repostioning;Moist Heat;Gait training;Stair training;Functional mobility training;Therapeutic activities;Therapeutic exercise;Balance training;Neuromuscular re-education;Cognitive remediation;Patient/family education;Manual techniques;Dry needling;Vestibular;Visual/perceptual remediation/compensation;Joint Manipulations;Spinal Manipulations    PT Next Visit Plan progressive balance training, HEP    PT Home Exercise Plan to be established as appropriate based on cognition and safety    Consulted and Agree with Plan of Care Patient           Patient will benefit from skilled therapeutic intervention in order to improve the following deficits and impairments:  Abnormal gait, Decreased cognition, Decreased knowledge of use of DME, Dizziness, Improper body mechanics, Pain, Impaired tone, Decreased coordination, Decreased mobility, Decreased activity tolerance, Decreased endurance, Difficulty walking, Decreased safety awareness, Decreased knowledge of precautions, Decreased balance  Visit Diagnosis: Difficulty in walking, not elsewhere  classified  Unsteadiness on feet  Dizziness and giddiness     Problem List Patient Active Problem List   Diagnosis Date Noted  . Persistent atrial fibrillation ([place]) 11/30/2019  . Secondary hypercoagulable state ([place]) 11/30/2019  . Arm DVT (deep venous thromboembolism), acute, left ([place]) 11/26/2019  . Hx of adenomatous colonic polyps 05/18/2019  . Cirrhosis, non-alcoholic ([place]) 41/28/7867  . Occult blood positive stool 05/12/2019  . HCAP (healthcare-associated pneumonia) 03/04/2019  . Hallucinations 02/10/2019  . Elevated blood sugar 01/27/2019  . S/P mitral valve repair 12/31/2018  . Bilateral pulmonary contusion 10/17/2018  . Cardiac LV ejection fraction of 20-34% 10/17/2018  . Chronic systolic CHF (congestive heart failure) ([place]) 10/17/2018  . History of ischemic cardiomyopathy 10/17/2018  . ICD (implantable cardioverter-defibrillator) in place 10/17/2018  . S/p left hip fracture 10/17/2018  . Traumatic retroperitoneal hematoma 10/17/2018  .  Age-related osteoporosis with current pathological fracture with routine healing 07/28/2018  . CAD (coronary artery disease) 07/21/2018  . GERD (gastroesophageal reflux disease) 07/21/2018  . Depression 07/21/2018  . CKD (chronic kidney disease), stage III 07/21/2018  . Iron deficiency anemia 07/21/2018  . Hypokalemia 07/21/2018  . Closed left hip fracture ([place]) 07/21/2018  . Fracture of femoral neck, left ([place]) 07/21/2018  . Lower extremity pain, bilateral 11/12/2017  . 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 Hyperlipidemia 09/01/2017  . Essential hypertension 09/01/2017  . PAD (peripheral artery disease) ([place]) 09/01/2017  . Anemia, unspecified 08/06/2017  . GAD (generalized anxiety disorder) 06/18/2017  . Prostate cancer screening 04/28/2017  . Hydrocele 04/28/2017  . Bradycardia   . Premature ventricular contraction   . Severe mitral insufficiency   . Cardiomyopathy, ischemic 04/26/2015  . Difficulty  hearing 12/29/2014  . Acute on chronic systolic CHF (congestive heart failure) ([place]) 10/10/2014  . 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 Protein-calorie malnutrition, severe ([place]) 07/29/2014  . AKI (acute kidney injury) ([place]) 07/29/2014    Everlean Alstrom. Graylon Good, PT, DPT 03/01/20, 11:02 AM  [facility] [address] [place], [address] Phone: [phone]   Fax:  [phone]  Name: Carl [name] MRN: [id number] Date of Birth: [birth year]

## 2020-03-02 NOTE — Progress Notes (Signed)
EPIC Encounter for ICM Monitoring  Patient Name: Carl [name] is a 69 y.o. male Date: 03/02/2020 Primary Care Physican: [name], MD Primary Cardiologist:[place] Electrophysiologist: [place] 02/20/2020 OfficeWeight: 158lbs  Time in AF0.0 hr/day (0.0%)  Transmission Reviewed.  OptivolThoracic impedancereturned to normal.  Prescribed:  Torsemide20mg take3tablets(60mg  total)by mouthtwice a day.  Metolazone 2.5 mg take 1 tablet by mouth every Wednesday morning with 1 extra Potassium.   Klor Con 20 mEqTaking 40 meq in morning and 20 meq in evening.  Extra 20 meq on Wednesday morning with Metolazone.  Labs: 01/27/2020 Creatinine 2.2,   BUN 32, Potassium 4.0, Sodium 134, GFR 30 [facility]) 01/12/2020 Creatinine2.00, BUN64, Potassium3.6, [id number], [id number] 12/21/2019 Creatinine2.04, BUN31, Potassium4.6, Sodium131, [id number]  12/19/2019 Creatinine2.04, BUN35, Potassium4.2, [id number], [id number] 11/30/2019 Creatinine 1.87, BUN 38, Potassium 4.6, Sodium 131, GFR 36-42 A complete set of results can be found in Results Review  Recommendations: None  Follow-up plan: ICM clinic phone appointment on8/09/2019. 91 day device clinic remote transmission 04/26/2020.  EP/Cardiology Office Visits: 04/26/2020 with Dr. [name].    Copy of ICM check sent to Dr. Rayann Heman.   3 month ICM trend: 03/01/2020    1 Year ICM trend:       Rosalene Billings, RN 03/02/2020 12:42 PM

## 2020-03-05 ENCOUNTER — Other Ambulatory Visit: Payer: Self-pay

## 2020-03-05 ENCOUNTER — Encounter: Payer: Medicare Other | Admitting: Physical Therapy

## 2020-03-05 ENCOUNTER — Ambulatory Visit: Payer: Medicare Other | Admitting: Physical Therapy

## 2020-03-05 ENCOUNTER — Encounter (HOSPITAL_COMMUNITY): Payer: Self-pay

## 2020-03-05 ENCOUNTER — Other Ambulatory Visit (HOSPITAL_COMMUNITY): Payer: Self-pay

## 2020-03-05 DIAGNOSIS — R2681 Unsteadiness on feet: Secondary | ICD-10-CM

## 2020-03-05 DIAGNOSIS — R262 Difficulty in walking, not elsewhere classified: Secondary | ICD-10-CM | POA: Diagnosis not present

## 2020-03-05 DIAGNOSIS — R42 Dizziness and giddiness: Secondary | ICD-10-CM

## 2020-03-05 NOTE — Therapy (Signed)
[facility] [address] [place], [address] Phone: [phone]   Fax:  [phone]  Physical Therapy Treatment  Patient Details  Name: Carl [name] MRN: [id number] Date of Birth: 1951-07-20 Referring Provider (PT): [name], NP   Encounter Date: 03/05/2020   PT End of Session - 03/06/20 1215    Visit Number 8    Number of Visits 24    Date for PT Re-Evaluation 05/02/20    Authorization Type [employer] Medicare Reporting period from 02/08/2020    Authorization - Visit Number 8    Authorization - Number of Visits 10    Progress Note Due on Visit 10    PT Start Time 1750    PT Stop Time 1830    PT Time Calculation (min) 40 min    Equipment Utilized During Treatment Gait belt    Activity Tolerance Patient tolerated treatment well    Behavior During Therapy [facility] for tasks assessed/performed           Past Medical History:  Diagnosis Date  . AICD (automatic cardioverter/defibrillator) present   . Anemia   . Anxiety   . Arthritis   . Brunner's gland hyperplasia of duodenum   . CHF (congestive heart failure) ([place])   . Coronary artery disease   . External hemorrhoids   . Fracture of femoral neck, left ([place]) 07/21/2018  . GERD (gastroesophageal reflux disease)   . Hearing loss   . HH (hiatus hernia)   . Internal hemorrhoids   . Ischemic cardiomyopathy   . Leg fracture, left   . Myocardial infarction ([place])   . Paronychia   . Pneumonia   . Psoriasis   . PUD (peptic ulcer disease)   . Schatzki's ring   . Tubular adenoma of colon   . Vitiligo     Past Surgical History:  Procedure Laterality Date  . ATRIAL SEPTAL DEFECT(ASD) CLOSURE N/A 12/30/2018   Procedure: ATRIAL SEPTAL DEFECT(ASD) CLOSURE;  Surgeon: [name], MD;  Location: [place] [facility];  Service: Cardiovascular;  Laterality: N/A;  . CARDIAC SURGERY    . CARDIOVERSION N/A 11/09/2019   Procedure: CARDIOVERSION;  Surgeon: [name], MD;  Location: [facility] ENDOSCOPY;  Service: Cardiovascular;  Laterality: N/A;  . CARDIOVERSION N/A 12/21/2019   Procedure: CARDIOVERSION;  Surgeon: [name], MD;  Location: [facility] ENDOSCOPY;  Service: Cardiovascular;  Laterality: N/A;  . COLONOSCOPY WITH ESOPHAGOGASTRODUODENOSCOPY (EGD)    . COLONOSCOPY WITH PROPOFOL N/A 08/24/2017   Procedure: COLONOSCOPY WITH PROPOFOL;  Surgeon: [name], MD;  Location: [facility] ENDOSCOPY;  Service: Endoscopy;  Laterality: N/A;  . CORONARY ARTERY BYPASS GRAFT N/A 08/03/2014   Procedure: CORONARY ARTERY BYPASS GRAFTING (CABG);  Surgeon: [name], MD;  Location: [place];  Service: Open Heart Surgery;  Laterality: N/A;  . EP IMPLANTABLE DEVICE N/A 05/01/2015   MDT ICD implanted for primary prevention of sudden death  . ESOPHAGOGASTRODUODENOSCOPY (EGD) WITH PROPOFOL N/A 04/16/2015   Procedure: ESOPHAGOGASTRODUODENOSCOPY (EGD) WITH PROPOFOL;  Surgeon: [name], MD;  Location: [facility] ENDOSCOPY;  Service: Endoscopy;  Laterality: N/A;  . ESOPHAGOGASTRODUODENOSCOPY (EGD) WITH PROPOFOL N/A 08/24/2017   Procedure: ESOPHAGOGASTRODUODENOSCOPY (EGD) WITH PROPOFOL;  Surgeon: [name], MD;  Location: [facility] ENDOSCOPY;  Service: Endoscopy;  Laterality: N/A;  . FRACTURE SURGERY    . HERNIA REPAIR    . HIP PINNING,CANNULATED Left 07/22/2018   Procedure: CANNULATED HIP PINNING;  Surgeon: [name], MD;  Location: [facility]  OR;  Service: Orthopedics;  Laterality: Left;  . MITRAL VALVE REPAIR N/A 12/30/2018   Procedure: MITRAL VALVE REPAIR;  Surgeon: [name], MD;  Location: [place] [facility];  Service: Cardiovascular;  Laterality: N/A;  . RIGHT/LEFT HEART CATH AND CORONARY/GRAFT ANGIOGRAPHY N/A 12/21/2018   Procedure: RIGHT/LEFT HEART CATH AND CORONARY/GRAFT ANGIOGRAPHY;  Surgeon: [name], MD;  Location: [place] [facility];  Service: Cardiovascular;  Laterality: N/A;  . TEE WITHOUT CARDIOVERSION N/A 08/03/2014   Procedure: TRANSESOPHAGEAL  ECHOCARDIOGRAM (TEE);  Surgeon: [name], MD;  Location: [place];  Service: Open Heart Surgery;  Laterality: N/A;  . TEE WITHOUT CARDIOVERSION N/A 02/10/2018   Procedure: TRANSESOPHAGEAL ECHOCARDIOGRAM (TEE);  Surgeon: [name], MD;  Location: [facility] ENDOSCOPY;  Service: Cardiovascular;  Laterality: N/A;  . TEE WITHOUT CARDIOVERSION N/A 11/09/2019   Procedure: TRANSESOPHAGEAL ECHOCARDIOGRAM (TEE);  Surgeon: [name], MD;  Location: [facility] ENDOSCOPY;  Service: Cardiovascular;  Laterality: N/A;    There were no vitals filed for this visit.   Subjective Assessment - 03/06/20 1212    Subjective Pateint reports he is feeling well today. Seems to be saying something is bleeding inside but that his doctors are awaire and investigating it. Sister dropped him off and came to pick him up. Patient reports no falls since last session. Ambulating with SPC. Notes slight left sided neck pain.    Patient is accompained by: Family member    Pertinent History Patient is a 69 y.o. male who presents to outpatient physical therapy with a referral for medical diagnosis unsteadiness on feet, dizziness and giddiness. This patient's chief complaints consist of feeling unsteady on feet and occasional falling leading to the following functional deficits: difficulty with basic mobility including household and community ambulation, frequent injuries and pain from falling, completing ADLs, IADLs, safety and quality of life. Relevant past medical history and comorbidities are extensive (see above) with particular relevance of cognitive impairment/decline, repeated L femur fracture, presence of AICD, low blood pressure, former smoker, MI with 5x bipass, ischemic cardiomyopathy, CHF, chronic Afib with recent need for cardioversion, neck pain, leg pain, DVT in left arm (recently cleared), cellulitis of L LE (recently treated).  Patient denies hx of cancer, stroke, seizures, lung problem, diabetes, unexplained weight loss.     Limitations Lifting;Standing;Walking;House hold activities    Diagnostic tests US venous Img Upper Uni Lft report 11/20/2019: "IMPRESSION:Nonocclusive thrombus within the left brachial vein near theantecubital fossa" resolved per note on 5/27/2021CT Lumbar spine report 06/19/2020:"IMPRESSION:1. Healing right sacral ala fracture. This likely contributes to thepatient's right-sided hip pain.2. Dynamic listhesis with mild subarticular and foraminal stenosisbilaterally at L4-5. The stenosis is likely worse with standing andflexion.3. Mild right foraminal narrowing at L2-3.4. Broad-based disc protrusion at L2-3 and L3-4 extends into theforamina bilaterally both levels without other significant stenosis.5. Moderate left and mild right foraminal stenosis at L5-S1secondary to chronic broad-based disc protrusion and facethypertrophy. Mild left subarticular narrowing is present as well.6.  Aortic Atherosclerosis (ICD10-I70.0)."    Currently in Pain? Yes    Pain Score --   mild, no numeric rating provided   Pain Location Neck    Pain Orientation Left           TREATMENT: Recent DVT in left elbow region High fall risk! Cognitive deficits  Neuromuscular Re-education:to improve, balance, postural strength, muscle activation patterns, and stabilization strength required for functional activities, SBA - min A provided for all standing tasks for safey:  Standing on Airex pad (compliant surface), Treadmill bar in  front of patient for UE support as needed, encouraged no hands during activities, min A to prevent fall ~ 4 times:  - horizontal head turnsfollowing ball held in both hands,self-selected to narrow stance, 2x 20 - vertical head nodsfollowing ball held in bothhands, self-selected to narrow stance,2x 20 - eyes closed 2x1 min, self-selected to narrow stance  CGA- min A: - sit <> standstanding from airexfrom seat while holding a DB in both hands and pressing out overhead in front upon standing.  From 23 inch surface (nustep at level 3), with 10# DB 3x10. Working on improving balance upon immediate standing, especially when tired. - tandem walking on 10 foot green line, x 10 reps with CGA - min A to prevent fall.  - eyes closed walking forward while holding 10# DB in suitcase carry 4x25 feet (alternating UE with each set).  - eyes open backwards walking while holding 10# DB in suitcase carry 4x25 feet (alternating UE with each set).   Standing near TM bar for intermittant UE support/safety and min A to prevent occasional falls:  - side stepping over two yoga blocks lined up to create a low barrier,1x20 each way  - forward step over yoga block,with both feet then step back over yoga blocks with both feet. ~1x20 each side with pauses to reset knocked blocks.More difficult moving left foot forward.  Pt required multimodal cuing for proper technique and to facilitate improved neuromuscular control, strength, range of motion, and functional ability resulting in improved performance and form.     PT Education - 03/06/20 1214    Education Details exercise purpose/form, posture    Person(s) Educated Patient    Methods Explanation;Demonstration;Tactile cues    Comprehension Verbalized understanding;Returned demonstration;Verbal cues required;Tactile cues required;Need further instruction            PT Short Term Goals - 02/22/20 1757      PT SHORT TERM GOAL #1   Title Patient/family indepndent with initial home exercise program for self-management of symptoms.    Baseline to be established as safe and appropriate visit 2 (02/08/2020);    Time 2    Period Weeks    Status On-going    Target Date 02/23/20             PT Long Term Goals - 02/09/20 1209      PT LONG TERM GOAL #1   Title Family/patient is independent with a long-term home exercise program for self-management of symptoms.    Baseline to be established as safe and appropriate visit 2 (02/08/2020);    Time 12     Period Weeks    Status New    Target Date --   TARGET DATE FOR ALL LONG TERM GOALS: 05/02/2020     PT LONG TERM GOAL #2   Title Patient will score equal or more than 25/30 on Functional Gait Assessment to demonstrate low fall risk.    Baseline to be tested visit 2 (02/08/2020);    Time 12    Period Weeks    Status New      PT LONG TERM GOAL #3   Title Patient will demonstrate the ability to complete sequential tapping steps on 8 inch step independently to demonstrate improved ability to navigate around objects without falling.    Baseline required support and repeated instructions (02/08/2020);    Time 12    Period Weeks    Status New      PT LONG TERM GOAL #4   Title Patient  will demonstrated ability to complete Five Time Sit to Stand Test in equal or less than 12 seconds from 18.5 inch surface without UE support to demonstrate improved balance and safety with transfers.    Baseline 17 seconds from 18.5 inch plinth, initial hand use. . (02/08/2020);    Time 12    Period Weeks    Status New      PT LONG TERM GOAL #5   Title Patient will report less than 1 fall over 8 weeks to demonstrate improved safety with functional mobility.    Baseline at least 5 falls in the last 6 months (02/08/2020);    Time 12    Period Weeks    Status New                 Plan - 03/06/20 1219    Clinical Impression Statement Patient tolerated treatment well today but was slightly less stable than previously with airex exercises so used self-selected stance more than narrow stance. Continues to require support to prevent falls. Demo short halting steps with eyes closed and backwards. Patient would benefit from continued management of limiting condition by skilled physical therapist to address remaining impairments and functional limitations to work towards stated goals and return to PLOF or maximal functional independence.    Personal Factors and Comorbidities Age;Time since onset of  injury/illness/exacerbation;Behavior Pattern;Comorbidity 3+;Education;Social Background;Past/Current Experience;Fitness;Other    Comorbidities Relevant past medical history and comorbidities are extensive (see above) with particular relevance of cognitive impairment/decline, repeated L femur fracture, presence of AICD, low blood pressure, former smoker, MI with 5x bipass, ischemic cardiomyopathy, CHF, chronic Afib with recent need for cardioversion, neck pain, leg pain, DVT in left arm (recently cleared), cellulitis of L LE (recently treated).    Examination-Activity Limitations Carry;Stairs;Sit;Reach Overhead;Locomotion Level;Stand;Lift;Dressing;Bend;Transfers;Other    Examination-Participation Restrictions Community Activity;Cleaning;Meal Prep;Medication Management;Driving;Shop;Yard Work    [employer] Evolving/Moderate complexity    Rehab Potential Fair    PT Frequency 2x / week    PT Duration 12 weeks    PT Treatment/Interventions ADLs/Self Care Home Management;Cryotherapy;Canalith Repostioning;Moist Heat;Gait training;Stair training;Functional mobility training;Therapeutic activities;Therapeutic exercise;Balance training;Neuromuscular re-education;Cognitive remediation;Patient/family education;Manual techniques;Dry needling;Vestibular;Visual/perceptual remediation/compensation;Joint Manipulations;Spinal Manipulations    PT Next Visit Plan progressive balance training, HEP    PT Home Exercise Plan to be established as appropriate based on cognition and safety    Consulted and Agree with Plan of Care Patient           Patient will benefit from skilled therapeutic intervention in order to improve the following deficits and impairments:  Abnormal gait, Decreased cognition, Decreased knowledge of use of DME, Dizziness, Improper body mechanics, Pain, Impaired tone, Decreased coordination, Decreased mobility, Decreased activity tolerance, Decreased endurance, Difficulty walking,  Decreased safety awareness, Decreased knowledge of precautions, Decreased balance  Visit Diagnosis: Difficulty in walking, not elsewhere classified  Unsteadiness on feet  Dizziness and giddiness     Problem List Patient Active Problem List   Diagnosis Date Noted  . Persistent atrial fibrillation ([place]) 11/30/2019  . Secondary hypercoagulable state ([place]) 11/30/2019  . Arm DVT (deep venous thromboembolism), acute, left ([place]) 11/26/2019  . Hx of adenomatous colonic polyps 05/18/2019  . Cirrhosis, non-alcoholic ([place]) 70/17/7939  . Occult blood positive stool 05/12/2019  . HCAP (healthcare-associated pneumonia) 03/04/2019  . Hallucinations 02/10/2019  . Elevated blood sugar 01/27/2019  . S/P mitral valve repair 12/31/2018  . Bilateral pulmonary contusion 10/17/2018  . Cardiac LV ejection fraction of 20-34% 10/17/2018  . Chronic systolic CHF (congestive heart failure) (  [place]) 10/17/2018  . History of ischemic cardiomyopathy 10/17/2018  . ICD (implantable cardioverter-defibrillator) in place 10/17/2018  . S/p left hip fracture 10/17/2018  . Traumatic retroperitoneal hematoma 10/17/2018  . Age-related osteoporosis with current pathological fracture with routine healing 07/28/2018  . CAD (coronary artery disease) 07/21/2018  . GERD (gastroesophageal reflux disease) 07/21/2018  . Depression 07/21/2018  . CKD (chronic kidney disease), stage III 07/21/2018  . Iron deficiency anemia 07/21/2018  . Hypokalemia 07/21/2018  . Closed left hip fracture ([place]) 07/21/2018  . Fracture of femoral neck, left ([place]) 07/21/2018  . Lower extremity pain, bilateral 11/12/2017  . 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 Hyperlipidemia 09/01/2017  . Essential hypertension 09/01/2017  . PAD (peripheral artery disease) ([place]) 09/01/2017  . Anemia, unspecified 08/06/2017  . GAD (generalized anxiety disorder) 06/18/2017  . Prostate cancer screening 04/28/2017  . Hydrocele 04/28/2017  .  Bradycardia   . Premature ventricular contraction   . Severe mitral insufficiency   . Cardiomyopathy, ischemic 04/26/2015  . Difficulty hearing 12/29/2014  . Acute on chronic systolic CHF (congestive heart failure) ([place]) 10/10/2014  . 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 Protein-calorie malnutrition, severe ([place]) 07/29/2014  . AKI (acute kidney injury) ([place]) 07/29/2014    Everlean Alstrom. Graylon Good, PT, DPT 03/06/20, 12:20 PM  [facility] [address], [place], [address] Phone: [phone]   Fax:  [phone]  Name: Carl [name] MRN: [id number] Date of Birth: 1951-02-05

## 2020-03-05 NOTE — Progress Notes (Signed)
Today had a home visit with Carl [name].  The only complaint he has his is lower legs are itchy.  He has some cream and he states he is going to use it.  Advised to try not to itch it and use lotion, his skin looks dry.  He denies any pain.  He states physical therapy is helping the pain in legs.  He missed a carvedilol and atorvastatin last week.  Advised him to be careful with the small white pills they blend in with his box.  Him and sister is still going out to eat a lot.  He is trying to cut back on pepsi.  He has all his medications and refilled his boxes.  Will continue to visit for heart failure, medciation compliance and diet.   [place] [phone]

## 2020-03-06 ENCOUNTER — Encounter: Payer: Self-pay | Admitting: Physical Therapy

## 2020-03-06 ENCOUNTER — Other Ambulatory Visit (HOSPITAL_COMMUNITY): Payer: Self-pay | Admitting: Cardiology

## 2020-03-07 ENCOUNTER — Ambulatory Visit: Payer: Medicare Other | Admitting: Physical Therapy

## 2020-03-07 ENCOUNTER — Encounter: Payer: Medicare Other | Admitting: Physical Therapy

## 2020-03-07 ENCOUNTER — Other Ambulatory Visit: Payer: Self-pay

## 2020-03-07 DIAGNOSIS — R262 Difficulty in walking, not elsewhere classified: Secondary | ICD-10-CM | POA: Diagnosis not present

## 2020-03-07 DIAGNOSIS — R42 Dizziness and giddiness: Secondary | ICD-10-CM

## 2020-03-07 DIAGNOSIS — R2681 Unsteadiness on feet: Secondary | ICD-10-CM

## 2020-03-07 NOTE — Therapy (Signed)
[facility] [address], [place], [address] Phone: [phone]   Fax:  [phone]  Physical Therapy Treatment  Patient Details  Name: Carl [name] MRN: [id number] Date of Birth: 11-Nov-1950 Referring Provider (PT): [name], NP   Encounter Date: 03/07/2020   PT End of Session - 03/07/20 1624    Visit Number 9    Number of Visits 24    Date for PT Re-Evaluation 05/02/20    Authorization Type [employer] Medicare Reporting period from 02/08/2020    Authorization - Visit Number 9    Authorization - Number of Visits 10    Progress Note Due on Visit 10    PT Start Time 0950    PT Stop Time 1030    PT Time Calculation (min) 40 min    Equipment Utilized During Treatment Gait belt    Activity Tolerance Patient tolerated treatment well    Behavior During Therapy WFL for tasks assessed/performed           Past Medical History:  Diagnosis Date   AICD (automatic cardioverter/defibrillator) present    Anemia    Anxiety    Arthritis    Brunner's gland hyperplasia of duodenum    CHF (congestive heart failure) ([place])    Coronary artery disease    External hemorrhoids    Fracture of femoral neck, left ([place]) 07/21/2018   GERD (gastroesophageal reflux disease)    Hearing loss    HH (hiatus hernia)    Internal hemorrhoids    Ischemic cardiomyopathy    Leg fracture, left    Myocardial infarction ([place])    Paronychia    Pneumonia    Psoriasis    PUD (peptic ulcer disease)    Schatzki's ring    Tubular adenoma of colon    Vitiligo     Past Surgical History:  Procedure Laterality Date   ATRIAL SEPTAL DEFECT(ASD) CLOSURE N/A 12/30/2018   Procedure: ATRIAL SEPTAL DEFECT(ASD) CLOSURE;  Surgeon: [name], MD;  Location: [facility];  Service: Cardiovascular;  Laterality: N/A;   CARDIAC SURGERY     CARDIOVERSION N/A 11/09/2019   Procedure: CARDIOVERSION;  Surgeon: [name], MD;  Location: [facility] ENDOSCOPY;  Service: Cardiovascular;  Laterality: N/A;   CARDIOVERSION N/A 12/21/2019   Procedure: CARDIOVERSION;  Surgeon: [name], MD;  Location: [facility] ENDOSCOPY;  Service: Cardiovascular;  Laterality: N/A;   COLONOSCOPY WITH ESOPHAGOGASTRODUODENOSCOPY (EGD)     COLONOSCOPY WITH PROPOFOL N/A 08/24/2017   Procedure: COLONOSCOPY WITH PROPOFOL;  Surgeon: [name], MD;  Location: [facility] ENDOSCOPY;  Service: Endoscopy;  Laterality: N/A;   CORONARY ARTERY BYPASS GRAFT N/A 08/03/2014   Procedure: CORONARY ARTERY BYPASS GRAFTING (CABG);  Surgeon: [name], MD;  Location: [facility];  Service: Open Heart Surgery;  Laterality: N/A;   EP IMPLANTABLE DEVICE N/A 05/01/2015   MDT ICD implanted for primary prevention of sudden death   ESOPHAGOGASTRODUODENOSCOPY (EGD) WITH PROPOFOL N/A 04/16/2015   Procedure: ESOPHAGOGASTRODUODENOSCOPY (EGD) WITH PROPOFOL;  Surgeon: [name], MD;  Location: [facility] ENDOSCOPY;  Service: Endoscopy;  Laterality: N/A;   ESOPHAGOGASTRODUODENOSCOPY (EGD) WITH PROPOFOL N/A 08/24/2017   Procedure: ESOPHAGOGASTRODUODENOSCOPY (EGD) WITH PROPOFOL;  Surgeon: [name], MD;  Location: [facility] ENDOSCOPY;  Service: Endoscopy;  Laterality: N/A;   FRACTURE SURGERY     HERNIA REPAIR     HIP PINNING,CANNULATED Left 07/22/2018   Procedure: CANNULATED HIP PINNING;  Surgeon: [name], MD;  Location: [facility]  OR;  Service: Orthopedics;  Laterality: Left;   MITRAL VALVE REPAIR N/A 12/30/2018   Procedure: MITRAL VALVE REPAIR;  Surgeon: [name], MD;  Location: [facility];  Service: Cardiovascular;  Laterality: N/A;   RIGHT/LEFT HEART CATH AND CORONARY/GRAFT ANGIOGRAPHY N/A 12/21/2018   Procedure: RIGHT/LEFT HEART CATH AND CORONARY/GRAFT ANGIOGRAPHY;  Surgeon: [name], MD;  Location: [facility];  Service: Cardiovascular;  Laterality: N/A;   TEE WITHOUT CARDIOVERSION N/A 08/03/2014   Procedure: TRANSESOPHAGEAL  ECHOCARDIOGRAM (TEE);  Surgeon: [name], MD;  Location: [facility];  Service: Open Heart Surgery;  Laterality: N/A;   TEE WITHOUT CARDIOVERSION N/A 02/10/2018   Procedure: TRANSESOPHAGEAL ECHOCARDIOGRAM (TEE);  Surgeon: [name], MD;  Location: [facility] ENDOSCOPY;  Service: Cardiovascular;  Laterality: N/A;   TEE WITHOUT CARDIOVERSION N/A 11/09/2019   Procedure: TRANSESOPHAGEAL ECHOCARDIOGRAM (TEE);  Surgeon: [name], MD;  Location: [facility] ENDOSCOPY;  Service: Cardiovascular;  Laterality: N/A;    There were no vitals filed for this visit.   Subjective Assessment - 03/07/20 1625    Subjective Patient reports he is feeling okay but wants to know why his R leg hurts in the distal anterior thigh. He cannot say how often he gets pain there or how much it hurts and shows no physical compensation or pain behaviors favoring it. He is also very concerned about the internet in his home and requires re-direction to someone besides PT who may know about his internet.    Patient is accompained by: Family member    Pertinent History Patient is a 69 y.o. male who presents to outpatient physical therapy with a referral for medical diagnosis unsteadiness on feet, dizziness and giddiness. This patient's chief complaints consist of feeling unsteady on feet and occasional falling leading to the following functional deficits: difficulty with basic mobility including household and community ambulation, frequent injuries and pain from falling, completing ADLs, IADLs, safety and quality of life. Relevant past medical history and comorbidities are extensive (see above) with particular relevance of cognitive impairment/decline, repeated L femur fracture, presence of AICD, low blood pressure, former smoker, MI with 5x bipass, ischemic cardiomyopathy, CHF, chronic Afib with recent need for cardioversion, neck pain, leg pain, DVT in left arm (recently cleared), cellulitis of L LE (recently treated).  Patient denies hx of  cancer, stroke, seizures, lung problem, diabetes, unexplained weight loss.    Limitations Lifting;Standing;Walking;House hold activities    Diagnostic tests US venous Img Upper Uni Lft report 11/20/2019: "IMPRESSION:Nonocclusive thrombus within the left brachial vein near theantecubital fossa" resolved per note on 5/27/2021CT Lumbar spine report 06/19/2020:"IMPRESSION:1. Healing right sacral ala fracture. This likely contributes to thepatient's right-sided hip pain.2. Dynamic listhesis with mild subarticular and foraminal stenosisbilaterally at L4-5. The stenosis is likely worse with standing andflexion.3. Mild right foraminal narrowing at L2-3.4. Broad-based disc protrusion at L2-3 and L3-4 extends into theforamina bilaterally both levels without other significant stenosis.5. Moderate left and mild right foraminal stenosis at L5-S1secondary to chronic broad-based disc protrusion and facethypertrophy. Mild left subarticular narrowing is present as well.6.  Aortic Atherosclerosis (ICD10-I70.0)."    Currently in Pain? Yes    Pain Location Leg    Pain Orientation Right;Anterior;Lower           TREATMENT: Recent DVT in left elbow region High fall risk! Cognitive deficits  Neuromuscular Re-education:to improve, balance, postural strength, muscle activation patterns, and stabilization strength required for functional activities, SBA - min A provided for all standing tasks for safey:  Standing on Airex  pad (compliant surface), Treadmill bar in front of patient for UE support as needed, encouraged no hands during activities, min A to prevent fall ~ 4 times:  - horizontal head turnsfollowing ball held in both hands,self-selected tonarrow stance,2x 20 - vertical head nodsfollowing ball held in bothhands,self-selected tonarrow stance,2x 20 - eyes closed 2x1 min, self-selected tonarrow stance  CGA- min A: - sit <> standstanding from airexfrom seat while holding a DB in both hands and  pressing outoverheadin front upon standing. From 21.5 inchsurface (nustep at level 1), with 10# DB 3x15. Working on improving balance upon immediate standing, especially when tired.  - stepping forward and backwards in each square of agility ladder. 2x9 squares each side. Having a lot   - tandem walking on 10 foot green line, x 10 reps with CGA - min A to prevent fall.  - eyes closed walking forward while holding 10# DB in suitcase carry 4x25 feet (alternating UE with each set).  - eyes open backwards walking while holding 10# DB in suitcase carry 4x25 feet (alternating UE with each set).   Standing near TM bar for intermittant UE support/safetyand min A to prevent occasional falls: - side stepping over two yoga blocks lined up in their edge to create a low barrier,1x20 each way   Pt required multimodal cuing for proper technique and to facilitate improved neuromuscular control, strength, range of motion, and functional ability resulting in improved performance and form.    PT Education - 03/07/20 1624    Education Details exercise purpose/form, posture    Person(s) Educated Patient    Methods Explanation;Demonstration;Tactile cues;Verbal cues    Comprehension Verbalized understanding;Returned demonstration;Verbal cues required;Tactile cues required;Need further instruction            PT Short Term Goals - 02/22/20 1757      PT SHORT TERM GOAL #1   Title Patient/family indepndent with initial home exercise program for self-management of symptoms.    Baseline to be established as safe and appropriate visit 2 (02/08/2020);    Time 2    Period Weeks    Status On-going    Target Date 02/23/20             PT Long Term Goals - 02/09/20 1209      PT LONG TERM GOAL #1   Title Family/patient is independent with a long-term home exercise program for self-management of symptoms.    Baseline to be established as safe and appropriate visit 2 (02/08/2020);    Time 12    Period  Weeks    Status New    Target Date --   TARGET DATE FOR ALL LONG TERM GOALS: 05/02/2020     PT LONG TERM GOAL #2   Title Patient will score equal or more than 25/30 on Functional Gait Assessment to demonstrate low fall risk.    Baseline to be tested visit 2 (02/08/2020);    Time 12    Period Weeks    Status New      PT LONG TERM GOAL #3   Title Patient will demonstrate the ability to complete sequential tapping steps on 8 inch step independently to demonstrate improved ability to navigate around objects without falling.    Baseline required support and repeated instructions (02/08/2020);    Time 12    Period Weeks    Status New      PT LONG TERM GOAL #4   Title Patient will demonstrated ability to complete Five Time Sit to Stand Test  in equal or less than 12 seconds from 18.5 inch surface without UE support to demonstrate improved balance and safety with transfers.    Baseline 17 seconds from 18.5 inch plinth, initial hand use. . (02/08/2020);    Time 12    Period Weeks    Status New      PT LONG TERM GOAL #5   Title Patient will report less than 1 fall over 8 weeks to demonstrate improved safety with functional mobility.    Baseline at least 5 falls in the last 6 months (02/08/2020);    Time 12    Period Weeks    Status New                 Plan - 03/07/20 1624    Clinical Impression Statement Patient tolerated treatment well and showed improved balance compared to last session at start of session. He had a lot of difficulty with coordinating feet with the agility ladder stepping and tripped over the slightly higher yoga blocks several times requiring significant PT assist to prevent a fall. Patient would benefit from continued management of limiting condition by skilled physical therapist to address remaining impairments and functional limitations to work towards stated goals and return to PLOF or maximal functional independence    Personal Factors and Comorbidities Age;Time  since onset of injury/illness/exacerbation;Behavior Pattern;Comorbidity 3+;Education;Social Background;Past/Current Experience;Fitness;Other    Comorbidities Relevant past medical history and comorbidities are extensive (see above) with particular relevance of cognitive impairment/decline, repeated L femur fracture, presence of AICD, low blood pressure, former smoker, MI with 5x bipass, ischemic cardiomyopathy, CHF, chronic Afib with recent need for cardioversion, neck pain, leg pain, DVT in left arm (recently cleared), cellulitis of L LE (recently treated).    Examination-Activity Limitations Carry;Stairs;Sit;Reach Overhead;Locomotion Level;Stand;Lift;Dressing;Bend;Transfers;Other    Examination-Participation Restrictions Community Activity;Cleaning;Meal Prep;Medication Management;Driving;Shop;Yard Work    [employer] Evolving/Moderate complexity    Rehab Potential Fair    PT Frequency 2x / week    PT Duration 12 weeks    PT Treatment/Interventions ADLs/Self Care Home Management;Cryotherapy;Canalith Repostioning;Moist Heat;Gait training;Stair training;Functional mobility training;Therapeutic activities;Therapeutic exercise;Balance training;Neuromuscular re-education;Cognitive remediation;Patient/family education;Manual techniques;Dry needling;Vestibular;Visual/perceptual remediation/compensation;Joint Manipulations;Spinal Manipulations    PT Next Visit Plan progressive balance training, HEP    PT Home Exercise Plan to be established as appropriate based on cognition and safety    Consulted and Agree with Plan of Care Patient           Patient will benefit from skilled therapeutic intervention in order to improve the following deficits and impairments:  Abnormal gait, Decreased cognition, Decreased knowledge of use of DME, Dizziness, Improper body mechanics, Pain, Impaired tone, Decreased coordination, Decreased mobility, Decreased activity tolerance, Decreased endurance,  Difficulty walking, Decreased safety awareness, Decreased knowledge of precautions, Decreased balance  Visit Diagnosis: Difficulty in walking, not elsewhere classified  Unsteadiness on feet  Dizziness and giddiness     Problem List Patient Active Problem List   Diagnosis Date Noted   Persistent atrial fibrillation ([place]) 11/30/2019   Secondary hypercoagulable state ([place]) 11/30/2019   Arm DVT (deep venous thromboembolism), acute, left ([place]) 11/26/2019   Hx of adenomatous colonic polyps 05/18/2019   Cirrhosis, non-alcoholic ([place]) 41/66/0630   Occult blood positive stool 05/12/2019   HCAP (healthcare-associated pneumonia) 03/04/2019   Hallucinations 02/10/2019   Elevated blood sugar 01/27/2019   S/P mitral valve repair 12/31/2018   Bilateral pulmonary contusion 10/17/2018   Cardiac LV ejection fraction of 20-34% 16/08/930   Chronic systolic CHF (congestive heart failure) ([place]) 10/17/2018  History of ischemic cardiomyopathy 10/17/2018   ICD (implantable cardioverter-defibrillator) in place 10/17/2018   S/p left hip fracture 10/17/2018   Traumatic retroperitoneal hematoma 10/17/2018   Age-related osteoporosis with current pathological fracture with routine healing 07/28/2018   CAD (coronary artery disease) 07/21/2018   GERD (gastroesophageal reflux disease) 07/21/2018   Depression 07/21/2018   CKD (chronic kidney disease), stage III 07/21/2018   Iron deficiency anemia 07/21/2018   Hypokalemia 07/21/2018   Closed left hip fracture ([place]) 07/21/2018   Fracture of femoral neck, left ([place]) 07/21/2018   Lower extremity pain, bilateral 11/12/2017   Chronic superficial gastritis without bleeding 09/14/2017   Hyperlipidemia 09/01/2017   Essential hypertension 09/01/2017   PAD (peripheral artery disease) ([place]) 09/01/2017   Anemia, unspecified 08/06/2017   GAD (generalized anxiety disorder) 06/18/2017   Prostate cancer screening 04/28/2017    Hydrocele 04/28/2017   Bradycardia    Premature ventricular contraction    Severe mitral insufficiency    Cardiomyopathy, ischemic 04/26/2015   Difficulty hearing 12/29/2014   Acute on chronic systolic CHF (congestive heart failure) ([place]) 10/10/2014   S/P CABG x 5 08/03/2014   Protein-calorie malnutrition, severe ([place]) 07/29/2014   AKI (acute kidney injury) ([place]) 07/29/2014    Everlean Alstrom. Graylon Good, PT, DPT 03/07/20, 4:28 PM  [facility] [address] [place], [address] Phone: [phone]   Fax:  [phone]  Name: IVER MIKLAS MRN: [id number] Date of Birth: 1951/03/09

## 2020-03-12 ENCOUNTER — Encounter: Payer: Medicare Other | Admitting: Physical Therapy

## 2020-03-12 ENCOUNTER — Ambulatory Visit: Payer: Medicare Other | Admitting: Physical Therapy

## 2020-03-12 ENCOUNTER — Telehealth: Payer: Self-pay | Admitting: Physical Therapy

## 2020-03-12 NOTE — Telephone Encounter (Signed)
When patient no-showed for his 9am appointment today, called patient at home and on cell number listed in chart. No answer or place to leave a message. Patient called back and wanted to know when his appointment was today. States his calendar says 1:45pm. Attempted to explain to him our schedule says 9am which is already passed but we have 10:30 and 6:30 open at this moment if he can come in at that time. Informed him his next scheduled appointment is at 9 am Wednesday 03/14/2020. Patient was unable to repeat back the information and continued to seem confused. Offered to call his sister, [name], who brings him to PT and talk to her about it. He agreed and gave her phone number [phone].   Called pt's sister, [name], (with his permission). No answer, left VM explaining we had his appointment down for 9am today but we currently have 10:30 and 6:30pm open if she wanted to bring him then. Also explained the next scheduled visit we have for him is Wednesday 7/21 at 9am. Requested she call back if she has questions or would like to rescheduled today's missed session. Also let her know pt had called and thought his appt was at 1:45 today and that he had agreed for me to call her. Call back number provided: [phone].  Everlean Alstrom. Graylon Good, PT, DPT 03/12/20, 9:29 AM

## 2020-03-14 ENCOUNTER — Encounter: Payer: Self-pay | Admitting: Physical Therapy

## 2020-03-14 ENCOUNTER — Other Ambulatory Visit: Payer: Self-pay

## 2020-03-14 ENCOUNTER — Ambulatory Visit: Payer: Medicare Other | Admitting: Physical Therapy

## 2020-03-14 ENCOUNTER — Encounter: Payer: Medicare Other | Admitting: Physical Therapy

## 2020-03-14 VITALS — BP 100/64 | HR 59

## 2020-03-14 DIAGNOSIS — R262 Difficulty in walking, not elsewhere classified: Secondary | ICD-10-CM

## 2020-03-14 DIAGNOSIS — R2681 Unsteadiness on feet: Secondary | ICD-10-CM

## 2020-03-14 DIAGNOSIS — R42 Dizziness and giddiness: Secondary | ICD-10-CM

## 2020-03-14 NOTE — Therapy (Signed)
[facility] [address], [place], [address] Phone: [phone]   Fax:  [phone]  Physical Therapy Treatment / Progress Note Reporting Period: 02/08/2020 - 03/14/2020  Patient Details  Name: Carl [name] MRN: [id number] Date of Birth: 02-04-1951 Referring Provider (PT): [name], NP   Encounter Date: 03/14/2020   PT End of Session - 03/14/20 1100    Visit Number 10    Number of Visits 24    Date for PT Re-Evaluation 05/02/20    Authorization Type [employer] Medicare Reporting period from 02/08/2020    Authorization - Visit Number 10    Authorization - Number of Visits 10    Progress Note Due on Visit 10    PT Start Time 0900    PT Stop Time 0945    PT Time Calculation (min) 45 min    Equipment Utilized During Treatment Gait belt    Activity Tolerance Patient tolerated treatment well    Behavior During Therapy [facility] for tasks assessed/performed           Past Medical History:  Diagnosis Date  . AICD (automatic cardioverter/defibrillator) present   . Anemia   . Anxiety   . Arthritis   . Brunner's gland hyperplasia of duodenum   . CHF (congestive heart failure) ([place])   . Coronary artery disease   . External hemorrhoids   . Fracture of femoral neck, left ([place]) 07/21/2018  . GERD (gastroesophageal reflux disease)   . Hearing loss   . HH (hiatus hernia)   . Internal hemorrhoids   . Ischemic cardiomyopathy   . Leg fracture, left   . Myocardial infarction ([place])   . Paronychia   . Pneumonia   . Psoriasis   . PUD (peptic ulcer disease)   . Schatzki's ring   . Tubular adenoma of colon   . Vitiligo     Past Surgical History:  Procedure Laterality Date  . ATRIAL SEPTAL DEFECT(ASD) CLOSURE N/A 12/30/2018   Procedure: ATRIAL SEPTAL DEFECT(ASD) CLOSURE;  Surgeon: [name], MD;  Location: [place] [facility];  Service: Cardiovascular;  Laterality: N/A;  . CARDIAC SURGERY    . CARDIOVERSION  N/A 11/09/2019   Procedure: CARDIOVERSION;  Surgeon: [name], MD;  Location: [facility] ENDOSCOPY;  Service: Cardiovascular;  Laterality: N/A;  . CARDIOVERSION N/A 12/21/2019   Procedure: CARDIOVERSION;  Surgeon: [name], MD;  Location: [facility] ENDOSCOPY;  Service: Cardiovascular;  Laterality: N/A;  . COLONOSCOPY WITH ESOPHAGOGASTRODUODENOSCOPY (EGD)    . COLONOSCOPY WITH PROPOFOL N/A 08/24/2017   Procedure: COLONOSCOPY WITH PROPOFOL;  Surgeon: [name], MD;  Location: [facility] ENDOSCOPY;  Service: Endoscopy;  Laterality: N/A;  . CORONARY ARTERY BYPASS GRAFT N/A 08/03/2014   Procedure: CORONARY ARTERY BYPASS GRAFTING (CABG);  Surgeon: [name], MD;  Location: [place];  Service: Open Heart Surgery;  Laterality: N/A;  . EP IMPLANTABLE DEVICE N/A 05/01/2015   MDT ICD implanted for primary prevention of sudden death  . ESOPHAGOGASTRODUODENOSCOPY (EGD) WITH PROPOFOL N/A 04/16/2015   Procedure: ESOPHAGOGASTRODUODENOSCOPY (EGD) WITH PROPOFOL;  Surgeon: [name], MD;  Location: [facility] ENDOSCOPY;  Service: Endoscopy;  Laterality: N/A;  . ESOPHAGOGASTRODUODENOSCOPY (EGD) WITH PROPOFOL N/A 08/24/2017   Procedure: ESOPHAGOGASTRODUODENOSCOPY (EGD) WITH PROPOFOL;  Surgeon: [name], MD;  Location: [facility] ENDOSCOPY;  Service: Endoscopy;  Laterality: N/A;  . FRACTURE SURGERY    . HERNIA REPAIR    . HIP PINNING,CANNULATED Left 07/22/2018   Procedure: CANNULATED HIP PINNING;  Surgeon: [name], MD;  Location: [place];  Service: Orthopedics;  Laterality: Left;  . MITRAL VALVE REPAIR N/A 12/30/2018   Procedure: MITRAL VALVE REPAIR;  Surgeon: [name], MD;  Location: [place] [facility];  Service: Cardiovascular;  Laterality: N/A;  . RIGHT/LEFT HEART CATH AND CORONARY/GRAFT ANGIOGRAPHY N/A 12/21/2018   Procedure: RIGHT/LEFT HEART CATH AND CORONARY/GRAFT ANGIOGRAPHY;  Surgeon: [name], MD;  Location: [place] [facility];  Service: Cardiovascular;  Laterality: N/A;  . TEE  WITHOUT CARDIOVERSION N/A 08/03/2014   Procedure: TRANSESOPHAGEAL ECHOCARDIOGRAM (TEE);  Surgeon: [name], MD;  Location: [place];  Service: Open Heart Surgery;  Laterality: N/A;  . TEE WITHOUT CARDIOVERSION N/A 02/10/2018   Procedure: TRANSESOPHAGEAL ECHOCARDIOGRAM (TEE);  Surgeon: [name], MD;  Location: [facility] ENDOSCOPY;  Service: Cardiovascular;  Laterality: N/A;  . TEE WITHOUT CARDIOVERSION N/A 11/09/2019   Procedure: TRANSESOPHAGEAL ECHOCARDIOGRAM (TEE);  Surgeon: [name], MD;  Location: [facility] ENDOSCOPY;  Service: Cardiovascular;  Laterality: N/A;    Vitals:   03/14/20 0922  BP: 100/64  Pulse: (!) 59  SpO2: 96%     Subjective Assessment - 03/14/20 0909    Subjective Patient reports he is feeling well today. Would like to know what some of the crust on his R eye is. Denies any falls since last session. Denies pain. Patient states he feels like PT is helping him and that he is walking a bit better. Patient states he gets tired quickly when walking in response to questions about what the hardest thing for him to do is.    Patient is accompained by: Family member    Pertinent History Patient is a 69 y.o. male who presents to outpatient physical therapy with a referral for medical diagnosis unsteadiness on feet, dizziness and giddiness. This patient's chief complaints consist of feeling unsteady on feet and occasional falling leading to the following functional deficits: difficulty with basic mobility including household and community ambulation, frequent injuries and pain from falling, completing ADLs, IADLs, safety and quality of life. Relevant past medical history and comorbidities are extensive (see above) with particular relevance of cognitive impairment/decline, repeated L femur fracture, presence of AICD, low blood pressure, former smoker, MI with 5x bipass, ischemic cardiomyopathy, CHF, chronic Afib with recent need for cardioversion, neck pain, leg pain, DVT in left arm  (recently cleared), cellulitis of L LE (recently treated).  Patient denies hx of cancer, stroke, seizures, lung problem, diabetes, unexplained weight loss.    Limitations Lifting;Standing;Walking;House hold activities    Diagnostic tests US venous Img Upper Uni Lft report 11/20/2019: "IMPRESSION:Nonocclusive thrombus within the left brachial vein near theantecubital fossa" resolved per note on 5/27/2021CT Lumbar spine report 06/19/2020:"IMPRESSION:1. Healing right sacral ala fracture. This likely contributes to thepatient's right-sided hip pain.2. Dynamic listhesis with mild subarticular and foraminal stenosisbilaterally at L4-5. The stenosis is likely worse with standing andflexion.3. Mild right foraminal narrowing at L2-3.4. Broad-based disc protrusion at L2-3 and L3-4 extends into theforamina bilaterally both levels without other significant stenosis.5. Moderate left and mild right foraminal stenosis at L5-S1secondary to chronic broad-based disc protrusion and facethypertrophy. Mild left subarticular narrowing is present as well.6.  Aortic Atherosclerosis (ICD10-I70.0)."    Currently in Pain? No/denies              [facility] PT Assessment - 03/14/20 0001      Assessment   Medical Diagnosis unsteadiness on feet, dizziness and giddiness    Referring Provider (PT) Fields, Colan Neptune, NP    Onset Date/Surgical  Date --   unclear   Hand Dominance Left    Next MD Visit not sure which day    Prior Therapy two visits at main hospital      Precautions   Precautions Fall;ICD/Pacemaker   no salt     Restrictions   Weight Bearing Restrictions No      Balance Screen   Has the patient fallen in the past 6 months Yes    How many times? 5    Has the patient had a decrease in activity level because of a fear of falling?  No    Is the patient reluctant to leave their home because of a fear of falling?  No      Home Environment   Living Environment Private residence    Living Arrangements Alone     Available Help at Discharge Family;Friend(s);Other (Comment)   has help with yard, cleaning home, medications, meals   Type of [place] to enter    Entrance Stairs-Number of Steps 3    Entrance Stairs-Rails Right;Left;Can reach both    [place] One level    [place] - single point;Walker - 2 wheels;Other (comment);Shower seat;Grab bars - tub/shower      Prior Function   Level of Independence Needs assistance with homemaking;Needs assistance with ADLs;Requires assistive device for independence;Independent with basic ADLs;Independent with household mobility with device;Other (comment)   used to be able to be completely independent "quite a while    Vocation Retired;On disability    Vocation Requirements used to work at [employer], 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     Leisure used to like to walk his dog      Cognition   Overall Cognitive Status History of cognitive impairments - at baseline    Memory Impaired    Executive Function --   impaired     Observation/Other Assessments   Focus on Therapeutic Outcomes (FOTO)  not appropriate due to cognitive deficits      [employer] comment: --      Functional Gait  Assessment   Gait Level Surface Walks 20 ft, slow speed, abnormal gait pattern, evidence for imbalance or deviates 10-15 in outside of the 12 in walkway width. Requires more than 7 sec to ambulate 20 ft.   16 seconds   Change in Gait Speed Makes only minor adjustments to walking speed, or accomplishes a change in speed with significant gait deviations, deviates 10-15 in outside the 12 in walkway width, or changes speed but loses balance but is able to recover and continue walking.   comes to complete stop before changing speed   Gait with Horizontal Head Turns Performs head turns with moderate changes in gait velocity, slows down, deviates 10-15 in outside 12 in walkway width but  recovers, can continue to walk.    Gait with Vertical Head Turns Performs task with slight change in gait velocity (eg, minor disruption to smooth gait path), deviates 6 - 10 in outside 12 in walkway width or uses assistive device    Gait and Pivot Turn Pivot turns safely in greater than 3 sec and stops with no loss of balance, or pivot turns safely within 3 sec and stops with mild imbalance, requires small steps to catch balance.    Step Over Obstacle Is able to step over one shoe box (4.5 in total height) but must slow  down and adjust steps to clear box safely. May require verbal cueing.    Gait with Narrow Base of Support Ambulates less than 4 steps heel to toe or cannot perform without assistance.   toes angled, able to take x10 steps arms across chest, CGA   Gait with Eyes Closed Walks 20 ft, slow speed, abnormal gait pattern, evidence for imbalance, deviates 10-15 in outside 12 in walkway width. Requires more than 9 sec to ambulate 20 ft.    Ambulating Backwards Walks 20 ft, uses assistive device, slower speed, mild gait deviations, deviates 6-10 in outside 12 in walkway width.    Steps Two feet to a stair, must use rail.    Total Score 12    FGA comment: < 19 = high risk fall              OBJECTIVE  OBSERVATION/INSPECTION Posture: WFL  Posture correction:   Tremor: none  Bed mobility: supine to sit WFL.  Transfers: sit <> stand independent  Gait: grossly WFL for household and short community ambulation with single point cane except significant unsteadiness noted at times and high level of stumbling, etc when not using AD.    FUNCTIONAL/BALANCE TESTS: Five Time Sit to Stand (5TSTS): 15 seconds from 18.5 inch plinth,No UE use.  Alternating toe taps to 8 inch surface, x4 each foot, in less than 20 seconds, CGA for support, no AD.  6 Minute Walk Test: 1100 feet with SPC and CGA, somewhat uneven gait with uncontrolled steps at times.   EDUCATION/COGNITION: Patient is alert  and oriented X 4.   Objective measurements completed on examination: See above findings.    TREATMENT: Recent DVT in left elbow region High fall risk! Cognitive deficits  Neuromuscular Re-education:to improve, balance, postural strength, muscle activation patterns, and stabilization strength required for functional activities, SBA - min A provided for all standing tasks for safey: - Testing to assess progress: FGA, alternating toe taps to 8 inch surface (see above).   Therapeutic exercise: to centralize symptoms and improve ROM, strength, muscular endurance, and activity tolerance required for successful completion of functional activities.  - Testing to assess progress: 6MWT, 5TSTS (see above).  - vitals measurement after patient seemed slower and with harder time following directions than usual. Low but [facility] for exercise and consistent with patient's baseline measurement at initial eval.   Pt required multimodal cuing for proper technique and to facilitate improved neuromuscular control, strength, range of motion, and functional ability resulting in improved performance and form.     PT Education - 03/14/20 1059    Education Details exercise purpose/form, posture. POC    Person(s) Educated Patient    Methods Explanation;Demonstration;Tactile cues;Verbal cues    Comprehension Verbalized understanding;Returned demonstration;Verbal cues required;Tactile cues required;Need further instruction            PT Short Term Goals - 03/14/20 1106      PT SHORT TERM GOAL #1   Title Patient/family indepndent with initial home exercise program for self-management of symptoms.    Baseline to be established as safe and appropriate visit 2 (02/08/2020); Patient requires step by step cuing to perform exercises safey with adequate guarding; family does not attend sessions, will continue to assess for ability to safely perform HEP (03/14/2020);    Time 2    Period Weeks    Status On-going     Target Date 02/23/20             PT Long Term Goals - 03/14/20  1107      PT LONG TERM GOAL #1   Title Family/patient is independent with a long-term home exercise program for self-management of symptoms.    Baseline to be established as safe and appropriate visit 2 (02/08/2020); Patient requires step by step cuing to perform exercises safey with adequate guarding; family does not attend sessions, will continue to assess for ability to safely perform HEP (03/14/2020);    Time 12    Period Weeks    Status On-going   TARGET DATE FOR ALL LONG TERM GOALS: 05/02/2020     PT LONG TERM GOAL #2   Title Patient will score equal or more than 25/30 on Functional Gait Assessment to demonstrate low fall risk.    Baseline to be tested visit 2 (02/08/2020); 14/30 (02/13/2020); 12/30 (03/14/2020);    Time 12    Period Weeks    Status On-going      PT LONG TERM GOAL #3   Title Patient will demonstrate the ability to complete sequential tapping steps on 8 inch step independently to demonstrate improved ability to navigate around objects without falling.    Baseline required support and repeated instructions (02/08/2020); able to complete with CGA and no loss of balance (03/14/2020):    Time 12    Period Weeks    Status Partially Met      PT LONG TERM GOAL #4   Title Patient will demonstrated ability to complete Five Time Sit to Stand Test in equal or less than 12 seconds from 18.5 inch surface without UE support to demonstrate improved balance and safety with transfers.    Baseline 17 seconds from 18.5 inch plinth, initial hand use. . (02/08/2020); 15 seconds from 18.5 inch plinth,No UE use. (03/14/2020);    Time 12    Period Weeks    Status Partially Met      PT LONG TERM GOAL #5   Title Patient will report less than 1 fall over 8 weeks to demonstrate improved safety with functional mobility.    Baseline at least 5 falls in the last 6 months (02/08/2020); reports no falls over the last 5 weeks  (03/14/2020);    Time 12    Period Weeks    Status Partially Met                 Plan - 03/14/20 1114    Clinical Impression Statement Patient has attended 10 physical therapy treatment sessions and reports feeling more confident with walking. States he continues to have difficulty walking longer distances and did have some leg pain following 6MWT today. Patient has progressed towards toe tapping goal, sit to stand goal, and reports no falls since initial eval. Patient did score worse today on the Functional Gait Assessment and had a lot of difficulty following directions for the first part of the test, walking extremely slowly and stopping when commands were given to change speed, despite multiple attempts. Paused test to take vitals and they were found to be normal for patient's baseline. Patient is limited by cognition and requires constant attendance to safely complete exercises at this point, so a HEP has not been assigned. Patient may be able to perform with sister at home if she is willing. Patient appears to be benefiting from PT at this time and is still at high risk of falling. Patient presents with significant balance, pain, and cognitive  impairments that are limiting ability to complete basic mobility including household and community ambulation, frequent injuries and pain from  falling, completing ADLs, IADLs, safety home alone without difficulty and decreased quality of life. Patient would benefit from continued management of limiting condition by skilled physical therapist to address remaining impairments and functional limitations to work towards stated goals and return to PLOF or maximal functional independence.    Personal Factors and Comorbidities Age;Time since onset of injury/illness/exacerbation;Behavior Pattern;Comorbidity 3+;Education;Social Background;Past/Current Experience;Fitness;Other    Comorbidities Relevant past medical history and comorbidities are extensive (see  above) with particular relevance of cognitive impairment/decline, repeated L femur fracture, presence of AICD, low blood pressure, former smoker, MI with 5x bipass, ischemic cardiomyopathy, CHF, chronic Afib with recent need for cardioversion, neck pain, leg pain, DVT in left arm (recently cleared), cellulitis of L LE (recently treated).    Examination-Activity Limitations Carry;Stairs;Sit;Reach Overhead;Locomotion Level;Stand;Lift;Dressing;Bend;Transfers;Other    Examination-Participation Restrictions Community Activity;Cleaning;Meal Prep;Medication Management;Driving;Shop;Yard Work    [employer]chant [employer] officer Evolving/Moderate complexity    Rehab Potential Fair    PT Frequency 2x / week    PT Duration 12 weeks    PT Treatment/Interventions ADLs/Self Care Home Management;Cryotherapy;Canalith Repostioning;Moist Heat;Gait training;Stair training;Functional mobility training;Therapeutic activities;Therapeutic exercise;Balance training;Neuromuscular re-education;Cognitive remediation;Patient/family education;Manual techniques;Dry needling;Vestibular;Visual/perceptual remediation/compensation;Joint Manipulations;Spinal Manipulations    PT Next Visit Plan progressive balance training, HEP as safe    PT Home Exercise Plan to be established as appropriate based on cognition and safety    Consulted and Agree with Plan of Care Patient           Patient will benefit from skilled therapeutic intervention in order to improve the following deficits and impairments:  Abnormal gait, Decreased cognition, Decreased knowledge of use of DME, Dizziness, Improper body mechanics, Pain, Impaired tone, Decreased coordination, Decreased mobility, Decreased activity tolerance, Decreased endurance, Difficulty walking, Decreased safety awareness, Decreased knowledge of precautions, Decreased balance  Visit Diagnosis: Difficulty in walking, not elsewhere classified  Unsteadiness on feet  Dizziness and  giddiness     Problem List Patient Active Problem List   Diagnosis Date Noted  . Persistent atrial fibrillation ([place]) 11/30/2019  . Secondary hypercoagulable state ([place]) 11/30/2019  . Arm DVT (deep venous thromboembolism), acute, left ([place]) 11/26/2019  . Hx of adenomatous colonic polyps 05/18/2019  . Cirrhosis, non-alcoholic ([place]) 75/05/2584  . Occult blood positive stool 05/12/2019  . HCAP (healthcare-associated pneumonia) 03/04/2019  . Hallucinations 02/10/2019  . Elevated blood sugar 01/27/2019  . S/P mitral valve repair 12/31/2018  . Bilateral pulmonary contusion 10/17/2018  . Cardiac LV ejection fraction of 20-34% 10/17/2018  . Chronic systolic CHF (congestive heart failure) ([place]) 10/17/2018  . History of ischemic cardiomyopathy 10/17/2018  . ICD (implantable cardioverter-defibrillator) in place 10/17/2018  . S/p left hip fracture 10/17/2018  . Traumatic retroperitoneal hematoma 10/17/2018  . Age-related osteoporosis with current pathological fracture with routine healing 07/28/2018  . CAD (coronary artery disease) 07/21/2018  . GERD (gastroesophageal reflux disease) 07/21/2018  . Depression 07/21/2018  . CKD (chronic kidney disease), stage III 07/21/2018  . Iron deficiency anemia 07/21/2018  . Hypokalemia 07/21/2018  . Closed left hip fracture ([place]) 07/21/2018  . Fracture of femoral neck, left ([place]) 07/21/2018  . Lower extremity pain, bilateral 11/12/2017  . 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 Hyperlipidemia 09/01/2017  . Essential hypertension 09/01/2017  . PAD (peripheral artery disease) ([place]) 09/01/2017  . Anemia, unspecified 08/06/2017  . GAD (generalized anxiety disorder) 06/18/2017  . Prostate cancer screening 04/28/2017  . Hydrocele 04/28/2017  . Bradycardia   . Premature ventricular contraction   . Severe mitral insufficiency   . Cardiomyopathy, ischemic 04/26/2015  .  Difficulty hearing 12/29/2014  . Acute on chronic systolic CHF  (congestive heart failure) ([place]) 10/10/2014  . 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 Protein-calorie malnutrition, severe ([place]) 07/29/2014  . AKI (acute kidney injury) ([place]) 07/29/2014    Everlean Alstrom. Graylon Good, PT, DPT 03/14/20, 11:14 AM  [facility] [address] [place], [address] Phone: [phone]   Fax:  [phone]  Name: TREYVONE CHELF MRN: [id number] Date of Birth: [birth year]

## 2020-03-15 ENCOUNTER — Encounter: Payer: Medicare Other | Admitting: Physical Therapy

## 2020-03-16 ENCOUNTER — Other Ambulatory Visit: Payer: Self-pay

## 2020-03-16 ENCOUNTER — Inpatient Hospital Stay: Payer: Medicare Other

## 2020-03-16 VITALS — BP 103/67 | HR 65 | Temp 97.3°F | Resp 18

## 2020-03-16 DIAGNOSIS — D509 Iron deficiency anemia, unspecified: Secondary | ICD-10-CM

## 2020-03-16 DIAGNOSIS — N1831 Chronic kidney disease, stage 3a: Secondary | ICD-10-CM | POA: Diagnosis not present

## 2020-03-16 MED ORDER — IRON SUCROSE 20 MG/ML IV SOLN
200.0000 mg | Freq: Once | INTRAVENOUS | Status: AC
  Administered 2020-03-16: 200 mg via INTRAVENOUS
  Filled 2020-03-16: qty 10

## 2020-03-16 MED ORDER — SODIUM CHLORIDE 0.9 % IV SOLN
Freq: Once | INTRAVENOUS | Status: AC
  Filled 2020-03-16: qty 250

## 2020-03-19 ENCOUNTER — Ambulatory Visit: Payer: Medicare Other | Admitting: Physical Therapy

## 2020-03-19 ENCOUNTER — Encounter: Payer: Medicare Other | Admitting: Physical Therapy

## 2020-03-19 ENCOUNTER — Other Ambulatory Visit (HOSPITAL_COMMUNITY): Payer: Self-pay

## 2020-03-19 ENCOUNTER — Encounter (HOSPITAL_COMMUNITY): Payer: Self-pay

## 2020-03-19 NOTE — Progress Notes (Signed)
Today had a home visit with [name].  He is complaining of pain in back of left thigh and left hand hurting today.  He states cancelled PT today because he could not walk.  He is up ambulating when I arrived without assistance.  Advised him that is all the reason to go to PT if hurting and explain to them.  He denies any falls or injuries.  He has bandages on forearm, he states hit something outside.  Weight gain of 5 lbs in past week, in past month he has been 154 lbs to 169 lbs today.  Will advised HF clinic.  He denies any chest pain, headaches or dizziness.  Denies shortness of breath.  He states has been eating good and out a lot with his sister.  He is drinking more pepsi lately also.  Discussed foods and fluids with him.  Have discussed number of times with his family about his eating habits.  He has all his medications and it appears he has been taking them.  Called in his eliquis.  He has no swelling in lower legs and abdomen is soft.  Will continue to visit for heart failure.   [place] [phone]

## 2020-03-21 ENCOUNTER — Ambulatory Visit: Payer: Medicare Other | Admitting: Physical Therapy

## 2020-03-21 ENCOUNTER — Encounter: Payer: Medicare Other | Admitting: Physical Therapy

## 2020-03-26 ENCOUNTER — Other Ambulatory Visit (HOSPITAL_COMMUNITY): Payer: Self-pay

## 2020-03-26 ENCOUNTER — Encounter (HOSPITAL_COMMUNITY): Payer: Self-pay

## 2020-03-26 ENCOUNTER — Ambulatory Visit (INDEPENDENT_AMBULATORY_CARE_PROVIDER_SITE_OTHER): Payer: Medicare Other

## 2020-03-26 DIAGNOSIS — I5022 Chronic systolic (congestive) heart failure: Secondary | ICD-10-CM

## 2020-03-26 DIAGNOSIS — Z95810 Presence of automatic (implantable) cardiac defibrillator: Secondary | ICD-10-CM

## 2020-03-26 NOTE — Progress Notes (Signed)
Today had a home visit with [name].  He states his toes are burning.  He is sitting with no shoes or socks on.  Advised him to try lotion and socks with shoes.  Not discolored or swollen.  No edema in feet, ankles or legs.  He states in both feet.  He states going to see his foot doctor.  He states leg feels better.  He had physical therapy last week and more this week.  He is aware of up coming appts, his brother and sister takes him.  He has 3 packs of pepsi in his kitchen, discussed diet nad sodas with him.  His weight is down this week, he had a spike in weight last week but came down without med changes.  It appears he is taking all his medications according to his med box.  He has all his medications, filled his boxes up.  Called in refills for allupurinol and advised his brother he was out of Vit C.  He denies chest pain, headaches, dizziness or shortness of breath.  He denies abdomen feeling tight.  He states tired today, he was sleeping when I arrived.  Will continue to visit for heart failure.   [place] [phone]

## 2020-03-27 ENCOUNTER — Ambulatory Visit: Payer: Medicare Other | Admitting: Physical Therapy

## 2020-03-27 NOTE — Progress Notes (Addendum)
EPIC Encounter for ICM Monitoring  Patient Name: Carl [name] is a 69 y.o. male Date: 03/27/2020 Primary Care Physican: [name], MD Primary Cardiologist:[place] Electrophysiologist: Allred 8/2/2021Weight: 162lbs  Time in AF0.0 hr/day (0.0%)  Transmission Reviewed.  Per EMT home visit weight was up last week and was instructed by Dr [name] to take extra Metolazone dose last week.   OptivolThoracic impedanceclose to baseline normal.  Prescribed:  Torsemide20mg take3tablets(60mg  total)by mouthtwice a day.  Metolazone 2.5 mg take 1 tablet by mouth every Wednesday morning with 1 extra Potassium.   Klor Con 20 mEqTaking 40 meq in morning and 20 meq in evening.  Extra 20 meq on Wednesday morning with Metolazone.  Labs: 01/27/2020 Creatinine 2.2, BUN 32, Potassium 4.0, Sodium 134, GFR 30 [facility]) 01/12/2020 Creatinine2.00, BUN64, Potassium3.6, FVOHKG677, CHE03-52 12/21/2019 Creatinine2.04, BUN31, Potassium4.6, Sodium131, YEL85-90  12/19/2019 Creatinine2.04, BUN35, Potassium4.2, Sodium128, BPJ12-16 11/30/2019 Creatinine 1.87, BUN 38, Potassium 4.6, Sodium 131, GFR 36-42 A complete set of results can be found in Results Review  Recommendations:None  Follow-up plan: ICM clinic phone appointment on9/10/2019. 91 day device clinic remote transmission9/09/2019.  EP/Cardiology Office Visits: 04/25/2020 with Dr. [name].    Copy of ICM check sent to Dr. Rayann Heman.  3 month ICM trend: 03/26/2020    1 Year ICM trend:       Rosalene Billings, RN 03/27/2020 3:03 PM

## 2020-03-29 ENCOUNTER — Other Ambulatory Visit: Payer: Self-pay

## 2020-03-29 ENCOUNTER — Encounter: Payer: Self-pay | Admitting: Physical Therapy

## 2020-03-29 ENCOUNTER — Ambulatory Visit: Payer: Medicare Other | Attending: Family Medicine | Admitting: Physical Therapy

## 2020-03-29 DIAGNOSIS — R42 Dizziness and giddiness: Secondary | ICD-10-CM | POA: Diagnosis present

## 2020-03-29 DIAGNOSIS — R2681 Unsteadiness on feet: Secondary | ICD-10-CM | POA: Insufficient documentation

## 2020-03-29 DIAGNOSIS — R262 Difficulty in walking, not elsewhere classified: Secondary | ICD-10-CM | POA: Insufficient documentation

## 2020-03-29 NOTE — Therapy (Signed)
[name] [address], [place], [address] Phone: [phone]   Fax:  [phone]  Physical Therapy Treatment  Patient Details  Name: Carl [name] MRN: [id number] Date of Birth: 1951/08/20 Referring Provider (PT): [name], NP   Encounter Date: 03/29/2020   PT End of Session - 03/29/20 0958    Visit Number 11    Number of Visits 24    Date for PT Re-Evaluation 05/02/20    Authorization Type [employer] Medicare Reporting period from 02/08/2020    Progress Note Due on Visit 20    PT Start Time 0905    PT Stop Time 0943    PT Time Calculation (min) 38 min    Equipment Utilized During Treatment Gait belt    Activity Tolerance Patient tolerated treatment well    Behavior During Therapy [facility] for tasks assessed/performed           Past Medical History:  Diagnosis Date  . AICD (automatic cardioverter/defibrillator) present   . Anemia   . Anxiety   . Arthritis   . Brunner's gland hyperplasia of duodenum   . CHF (congestive heart failure) ([place])   . Coronary artery disease   . External hemorrhoids   . Fracture of femoral neck, left ([place]) 07/21/2018  . GERD (gastroesophageal reflux disease)   . Hearing loss   . HH (hiatus hernia)   . Internal hemorrhoids   . Ischemic cardiomyopathy   . Leg fracture, left   . Myocardial infarction ([place])   . Paronychia   . Pneumonia   . Psoriasis   . PUD (peptic ulcer disease)   . Schatzki's ring   . Tubular adenoma of colon   . Vitiligo     Past Surgical History:  Procedure Laterality Date  . ATRIAL SEPTAL DEFECT(ASD) CLOSURE N/A 12/30/2018   Procedure: ATRIAL SEPTAL DEFECT(ASD) CLOSURE;  Surgeon: [name], MD;  Location: [place] CV LAB;  Service: Cardiovascular;  Laterality: N/A;  . CARDIAC SURGERY    . CARDIOVERSION N/A 11/09/2019   Procedure: CARDIOVERSION;  Surgeon: [name], MD;  Location: [facility] ENDOSCOPY;  Service: Cardiovascular;   Laterality: N/A;  . CARDIOVERSION N/A 12/21/2019   Procedure: CARDIOVERSION;  Surgeon: [name], MD;  Location: [facility] ENDOSCOPY;  Service: Cardiovascular;  Laterality: N/A;  . COLONOSCOPY WITH ESOPHAGOGASTRODUODENOSCOPY (EGD)    . COLONOSCOPY WITH PROPOFOL N/A 08/24/2017   Procedure: COLONOSCOPY WITH PROPOFOL;  Surgeon: [name], MD;  Location: [facility] ENDOSCOPY;  Service: Endoscopy;  Laterality: N/A;  . CORONARY ARTERY BYPASS GRAFT N/A 08/03/2014   Procedure: CORONARY ARTERY BYPASS GRAFTING (CABG);  Surgeon: [name], MD;  Location: [place];  Service: Open Heart Surgery;  Laterality: N/A;  . EP IMPLANTABLE DEVICE N/A 05/01/2015   MDT ICD implanted for primary prevention of sudden death  . ESOPHAGOGASTRODUODENOSCOPY (EGD) WITH PROPOFOL N/A 04/16/2015   Procedure: ESOPHAGOGASTRODUODENOSCOPY (EGD) WITH PROPOFOL;  Surgeon: [name], MD;  Location: [facility] ENDOSCOPY;  Service: Endoscopy;  Laterality: N/A;  . ESOPHAGOGASTRODUODENOSCOPY (EGD) WITH PROPOFOL N/A 08/24/2017   Procedure: ESOPHAGOGASTRODUODENOSCOPY (EGD) WITH PROPOFOL;  Surgeon: [name], MD;  Location: [facility] ENDOSCOPY;  Service: Endoscopy;  Laterality: N/A;  . FRACTURE SURGERY    . HERNIA REPAIR    . HIP PINNING,CANNULATED Left 07/22/2018   Procedure: CANNULATED HIP PINNING;  Surgeon: [name], MD;  Location: [place];  Service: Orthopedics;  Laterality: Left;  . MITRAL VALVE REPAIR N/A 12/30/2018   Procedure:  MITRAL VALVE REPAIR;  Surgeon: [name], MD;  Location: [place] [facility] LAB;  Service: Cardiovascular;  Laterality: N/A;  . RIGHT/LEFT HEART CATH AND CORONARY/GRAFT ANGIOGRAPHY N/A 12/21/2018   Procedure: RIGHT/LEFT HEART CATH AND CORONARY/GRAFT ANGIOGRAPHY;  Surgeon: [name], MD;  Location: [place] CV LAB;  Service: Cardiovascular;  Laterality: N/A;  . TEE WITHOUT CARDIOVERSION N/A 08/03/2014   Procedure: TRANSESOPHAGEAL ECHOCARDIOGRAM (TEE);  Surgeon: [name], MD;  Location: [place];  Service: Open Heart Surgery;  Laterality: N/A;  . TEE WITHOUT CARDIOVERSION N/A 02/10/2018   Procedure: TRANSESOPHAGEAL ECHOCARDIOGRAM (TEE);  Surgeon: [name], MD;  Location: [facility] ENDOSCOPY;  Service: Cardiovascular;  Laterality: N/A;  . TEE WITHOUT CARDIOVERSION N/A 11/09/2019   Procedure: TRANSESOPHAGEAL ECHOCARDIOGRAM (TEE);  Surgeon: [name], MD;  Location: [facility] ENDOSCOPY;  Service: Cardiovascular;  Laterality: N/A;    There were no vitals filed for this visit.    Subjective Assessment - 03/29/20 0909    Subjective Patient reports his storm door hit him on saturday in the left proxmal anteriolateral thigh and it has been hurting "a little bit" today. He also states he has some pain in his lower back that is there all the time. He states he feels good enough to do "most of it" referring to his therapy session today. He reports he has a foot doctor appointment due to some skin coming off both toes. he states his sister has seen the bruise from the storm door. He did not bring his hearing aides today.    Patient is accompained by: Family member    Pertinent History Patient is a 69 y.o. male who presents to outpatient physical therapy with a referral for medical diagnosis unsteadiness on feet, dizziness and giddiness. This patient's chief complaints consist of feeling unsteady on feet and occasional falling leading to the following functional deficits: difficulty with basic mobility including household and community ambulation, frequent injuries and pain from falling, completing ADLs, IADLs, safety and quality of life. Relevant past medical history and comorbidities are extensive (see above) with particular relevance of cognitive impairment/decline, repeated L femur fracture, presence of AICD, low blood pressure, former smoker, MI with 5x bipass, ischemic cardiomyopathy, CHF, chronic Afib with recent need for cardioversion, neck pain, leg pain, DVT in left arm (recently cleared),  cellulitis of L LE (recently treated).  Patient denies hx of cancer, stroke, seizures, lung problem, diabetes, unexplained weight loss.    Limitations Lifting;Standing;Walking;House hold activities    Diagnostic tests US venous Img Upper Uni Lft report 11/20/2019: "IMPRESSION:Nonocclusive thrombus within the left brachial vein near theantecubital fossa" resolved per note on 5/27/2021CT Lumbar spine report 06/19/2020:"IMPRESSION:1. Healing right sacral ala fracture. This likely contributes to thepatient's right-sided hip pain.2. Dynamic listhesis with mild subarticular and foraminal stenosisbilaterally at L4-5. The stenosis is likely worse with standing andflexion.3. Mild right foraminal narrowing at L2-3.4. Broad-based disc protrusion at L2-3 and L3-4 extends into theforamina bilaterally both levels without other significant stenosis.5. Moderate left and mild right foraminal stenosis at L5-S1secondary to chronic broad-based disc protrusion and facethypertrophy. Mild left subarticular narrowing is present as well.6.  Aortic Atherosclerosis (ICD10-I70.0)."    Currently in Pain? Other (Comment)   "a little bit" in L proximal thigh and low back         OBJECTIVE Observation:  - softball sized bruise with swelling over proximal anteriolateral left thigh firm to touch. Appears to be stable.  - foot exam: R 2nd toe with dried blood at medial aspect of  nail. L with cream colored crust in between two toes. Pt report he is going to foot doctor to address this and get toe nails cut. No breaks in skin noted. Otherwise feet appear clean and WFL.    TREATMENT: Recent DVT in left elbow region High fall risk! Cognitive deficits  Neuromuscular Re-education:to improve, balance, postural strength, muscle activation patterns, and stabilization strength required for functional activities, SBA - min A provided for all standing tasks for safey: - observation to assess appropriateness of interventions (See  above)  Standing on Airex pad (compliant surface), Treadmill bar in front of patient for UE support as needed, encouraged no hands during activities, min A to prevent fall ~ 3 times:  - horizontal head turnsfollowing ball held in both hands,self-selected tonarrow stance,2x 20 - vertical head nodsfollowing ball held in bothhands,self-selected tonarrow stance,2x 20 - eyes closed 2x1 min, self-selected stance  CGA- min A: - sit <> standstanding from airexfrom seat while holding a DB in both hands and pressing outoverheadin front upon standing. From 21.5 inchsurface (nustep at level1), with 10# DB1x10, 2x15. Working on improving balance upon immediate standing, especially when tired. - stepping forward and backwards in each square of agility ladder. 2x9 squares each side.   Pt required multimodal cuing for proper technique and to facilitate improved neuromuscular control, strength, range of motion, and functional ability resulting in improved performance and form.     PT Education - 03/29/20 0958    Education Details exercise purpose/form, posture.    Person(s) Educated Patient    Methods Explanation;Demonstration;Verbal cues;Tactile cues    Comprehension Verbalized understanding;Returned demonstration;Verbal cues required;Tactile cues required;Need further instruction            PT Short Term Goals - 03/14/20 1106      PT SHORT TERM GOAL #1   Title Patient/family indepndent with initial home exercise program for self-management of symptoms.    Baseline to be established as safe and appropriate visit 2 (02/08/2020); Patient requires step by step cuing to perform exercises safey with adequate guarding; family does not attend sessions, will continue to assess for ability to safely perform HEP (03/14/2020);    Time 2    Period Weeks    Status On-going    Target Date 02/23/20             PT Long Term Goals - 03/14/20 1107      PT LONG TERM GOAL #1   Title  Family/patient is independent with a long-term home exercise program for self-management of symptoms.    Baseline to be established as safe and appropriate visit 2 (02/08/2020); Patient requires step by step cuing to perform exercises safey with adequate guarding; family does not attend sessions, will continue to assess for ability to safely perform HEP (03/14/2020);    Time 12    Period Weeks    Status On-going   TARGET DATE FOR ALL LONG TERM GOALS: 05/02/2020     PT LONG TERM GOAL #2   Title Patient will score equal or more than 25/30 on Functional Gait Assessment to demonstrate low fall risk.    Baseline to be tested visit 2 (02/08/2020); 14/30 (02/13/2020); 12/30 (03/14/2020);    Time 12    Period Weeks    Status On-going      PT LONG TERM GOAL #3   Title Patient will demonstrate the ability to complete sequential tapping steps on 8 inch step independently to demonstrate improved ability to navigate around objects without falling.  Baseline required support and repeated instructions (02/08/2020); able to complete with CGA and no loss of balance (03/14/2020):    Time 12    Period Weeks    Status Partially Met      PT LONG TERM GOAL #4   Title Patient will demonstrated ability to complete Five Time Sit to Stand Test in equal or less than 12 seconds from 18.5 inch surface without UE support to demonstrate improved balance and safety with transfers.    Baseline 17 seconds from 18.5 inch plinth, initial hand use. . (02/08/2020); 15 seconds from 18.5 inch plinth,No UE use. (03/14/2020);    Time 12    Period Weeks    Status Partially Met      PT LONG TERM GOAL #5   Title Patient will report less than 1 fall over 8 weeks to demonstrate improved safety with functional mobility.    Baseline at least 5 falls in the last 6 months (02/08/2020); reports no falls over the last 5 weeks (03/14/2020);    Time 12    Period Weeks    Status Partially Met                 Plan - 03/29/20 0958     Clinical Impression Statement Patient tolerated treatment well overall. Had difficulty following cuing at times due to not bringing his hearing aides. No complaint of pain during exercise until end of session he stated R knee hurt. Seemed more unsteady today when starting exercises with improvement with practice of standing balance exercises. Does have evidence of significant contusion to the left upper thigh but appears to be healing. Feet appeared to be well cared for. Patient would benefit from continued management of limiting condition by skilled physical therapist to address remaining impairments and functional limitations to work towards stated goals and return to PLOF or maximal functional independence.    Personal Factors and Comorbidities Age;Time since onset of injury/illness/exacerbation;Behavior Pattern;Comorbidity 3+;Education;Social Background;Past/Current Experience;Fitness;Other    Comorbidities Relevant past medical history and comorbidities are extensive (see above) with particular relevance of cognitive impairment/decline, repeated L femur fracture, presence of AICD, low blood pressure, former smoker, MI with 5x bipass, ischemic cardiomyopathy, CHF, chronic Afib with recent need for cardioversion, neck pain, leg pain, DVT in left arm (recently cleared), cellulitis of L LE (recently treated).    Examination-Activity Limitations Carry;Stairs;Sit;Reach Overhead;Locomotion Level;Stand;Lift;Dressing;Bend;Transfers;Other    Examination-Participation Restrictions Community Activity;Cleaning;Meal Prep;Medication Management;Driving;Shop;Yard Work    [employer] Evolving/Moderate complexity    Rehab Potential Fair    PT Frequency 2x / week    PT Duration 12 weeks    PT Treatment/Interventions ADLs/Self Care Home Management;Cryotherapy;Canalith Repostioning;Moist Heat;Gait training;Stair training;Functional mobility training;Therapeutic activities;Therapeutic exercise;Balance  training;Neuromuscular re-education;Cognitive remediation;Patient/family education;Manual techniques;Dry needling;Vestibular;Visual/perceptual remediation/compensation;Joint Manipulations;Spinal Manipulations    PT Next Visit Plan progressive balance training, HEP as safe    PT Home Exercise Plan to be established as appropriate based on cognition and safety    Consulted and Agree with Plan of Care Patient           Patient will benefit from skilled therapeutic intervention in order to improve the following deficits and impairments:  Abnormal gait, Decreased cognition, Decreased knowledge of use of DME, Dizziness, Improper body mechanics, Pain, Impaired tone, Decreased coordination, Decreased mobility, Decreased activity tolerance, Decreased endurance, Difficulty walking, Decreased safety awareness, Decreased knowledge of precautions, Decreased balance  Visit Diagnosis: Difficulty in walking, not elsewhere classified  Unsteadiness on feet  Dizziness and giddiness     Problem  List Patient Active Problem List   Diagnosis Date Noted  . Persistent atrial fibrillation ([place]) 11/30/2019  . Secondary hypercoagulable state ([place]) 11/30/2019  . Arm DVT (deep venous thromboembolism), acute, left ([place]) 11/26/2019  . Hx of adenomatous colonic polyps 05/18/2019  . Cirrhosis, non-alcoholic ([place]) 40/98/1191  . Occult blood positive stool 05/12/2019  . HCAP (healthcare-associated pneumonia) 03/04/2019  . Hallucinations 02/10/2019  . Elevated blood sugar 01/27/2019  . S/P mitral valve repair 12/31/2018  . Bilateral pulmonary contusion 10/17/2018  . Cardiac LV ejection fraction of 20-34% 10/17/2018  . Chronic systolic CHF (congestive heart failure) ([place]) 10/17/2018  . History of ischemic cardiomyopathy 10/17/2018  . ICD (implantable cardioverter-defibrillator) in place 10/17/2018  . S/p left hip fracture 10/17/2018  . Traumatic retroperitoneal hematoma 10/17/2018  . Age-related osteoporosis with  current pathological fracture with routine healing 07/28/2018  . CAD (coronary artery disease) 07/21/2018  . GERD (gastroesophageal reflux disease) 07/21/2018  . Depression 07/21/2018  . CKD (chronic kidney disease), stage III 07/21/2018  . Iron deficiency anemia 07/21/2018  . Hypokalemia 07/21/2018  . Closed left hip fracture ([place]) 07/21/2018  . Fracture of femoral neck, left ([place]) 07/21/2018  . Lower extremity pain, bilateral 11/12/2017  . 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 Hyperlipidemia 09/01/2017  . Essential hypertension 09/01/2017  . PAD (peripheral artery disease) ([place]) 09/01/2017  . Anemia, unspecified 08/06/2017  . GAD (generalized anxiety disorder) 06/18/2017  . Prostate cancer screening 04/28/2017  . Hydrocele 04/28/2017  . Bradycardia   . Premature ventricular contraction   . Severe mitral insufficiency   . Cardiomyopathy, ischemic 04/26/2015  . Difficulty hearing 12/29/2014  . Acute on chronic systolic CHF (congestive heart failure) ([place]) 10/10/2014  . 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 Protein-calorie malnutrition, severe ([place]) 07/29/2014  . AKI (acute kidney injury) ([place]) 07/29/2014    Everlean Alstrom. Graylon Good, PT, DPT 03/29/20, 10:01 AM  [facility] [address] [place], [address] Phone: [phone]   Fax:  [phone]  Name: JAVARIS WIGINGTON MRN: [id number] Date of Birth: [birth year]

## 2020-04-02 ENCOUNTER — Encounter: Payer: Self-pay | Admitting: Physical Therapy

## 2020-04-02 ENCOUNTER — Ambulatory Visit: Payer: Medicare Other | Admitting: Physical Therapy

## 2020-04-02 ENCOUNTER — Other Ambulatory Visit: Payer: Self-pay

## 2020-04-02 DIAGNOSIS — R2681 Unsteadiness on feet: Secondary | ICD-10-CM

## 2020-04-02 DIAGNOSIS — R262 Difficulty in walking, not elsewhere classified: Secondary | ICD-10-CM | POA: Diagnosis not present

## 2020-04-02 DIAGNOSIS — R42 Dizziness and giddiness: Secondary | ICD-10-CM

## 2020-04-02 NOTE — Therapy (Signed)
[facility] [address] [place], [address] Phone: [phone]   Fax:  [phone]  Physical Therapy Treatment  Patient Details  Name: Carl [name] MRN: [id number] Date of Birth: 10-02-1950 Referring Provider (PT): [name], NP   Encounter Date: 04/02/2020   PT End of Session - 04/02/20 1215    Visit Number 12    Number of Visits 24    Date for PT Re-Evaluation 05/02/20    Authorization Type [employer] Medicare Reporting period from 02/08/2020    Progress Note Due on Visit 20    PT Start Time 0912    PT Stop Time 0945    PT Time Calculation (min) 33 min    Equipment Utilized During Treatment Gait belt    Activity Tolerance Patient tolerated treatment well    Behavior During Therapy [facility] for tasks assessed/performed           Past Medical History:  Diagnosis Date  . AICD (automatic cardioverter/defibrillator) present   . Anemia   . Anxiety   . Arthritis   . Brunner's gland hyperplasia of duodenum   . CHF (congestive heart failure) ([place])   . Coronary artery disease   . External hemorrhoids   . Fracture of femoral neck, left ([place]) 07/21/2018  . GERD (gastroesophageal reflux disease)   . Hearing loss   . HH (hiatus hernia)   . Internal hemorrhoids   . Ischemic cardiomyopathy   . Leg fracture, left   . Myocardial infarction ([place])   . Paronychia   . Pneumonia   . Psoriasis   . PUD (peptic ulcer disease)   . Schatzki's ring   . Tubular adenoma of colon   . Vitiligo     Past Surgical History:  Procedure Laterality Date  . ATRIAL SEPTAL DEFECT(ASD) CLOSURE N/A 12/30/2018   Procedure: ATRIAL SEPTAL DEFECT(ASD) CLOSURE;  Surgeon: [name], MD;  Location: [place] [facility];  Service: Cardiovascular;  Laterality: N/A;  . CARDIAC SURGERY    . CARDIOVERSION N/A 11/09/2019   Procedure: CARDIOVERSION;  Surgeon: [name], MD;  Location: [facility] ENDOSCOPY;  Service: Cardiovascular;   Laterality: N/A;  . CARDIOVERSION N/A 12/21/2019   Procedure: CARDIOVERSION;  Surgeon: [name], MD;  Location: [facility] ENDOSCOPY;  Service: Cardiovascular;  Laterality: N/A;  . COLONOSCOPY WITH ESOPHAGOGASTRODUODENOSCOPY (EGD)    . COLONOSCOPY WITH PROPOFOL N/A 08/24/2017   Procedure: COLONOSCOPY WITH PROPOFOL;  Surgeon: [name], MD;  Location: [facility] ENDOSCOPY;  Service: Endoscopy;  Laterality: N/A;  . CORONARY ARTERY BYPASS GRAFT N/A 08/03/2014   Procedure: CORONARY ARTERY BYPASS GRAFTING (CABG);  Surgeon: [name], MD;  Location: [place];  Service: Open Heart Surgery;  Laterality: N/A;  . EP IMPLANTABLE DEVICE N/A 05/01/2015   MDT ICD implanted for primary prevention of sudden death  . ESOPHAGOGASTRODUODENOSCOPY (EGD) WITH PROPOFOL N/A 04/16/2015   Procedure: ESOPHAGOGASTRODUODENOSCOPY (EGD) WITH PROPOFOL;  Surgeon: [name], MD;  Location: [facility] ENDOSCOPY;  Service: Endoscopy;  Laterality: N/A;  . ESOPHAGOGASTRODUODENOSCOPY (EGD) WITH PROPOFOL N/A 08/24/2017   Procedure: ESOPHAGOGASTRODUODENOSCOPY (EGD) WITH PROPOFOL;  Surgeon: [name], MD;  Location: [facility] ENDOSCOPY;  Service: Endoscopy;  Laterality: N/A;  . FRACTURE SURGERY    . HERNIA REPAIR    . HIP PINNING,CANNULATED Left 07/22/2018   Procedure: CANNULATED HIP PINNING;  Surgeon: [name], MD;  Location: [place];  Service: Orthopedics;  Laterality: Left;  . MITRAL VALVE REPAIR N/A 12/30/2018   Procedure:  MITRAL VALVE REPAIR;  Surgeon: [name], MD;  Location: [place] [facility];  Service: Cardiovascular;  Laterality: N/A;  . RIGHT/LEFT HEART CATH AND CORONARY/GRAFT ANGIOGRAPHY N/A 12/21/2018   Procedure: RIGHT/LEFT HEART CATH AND CORONARY/GRAFT ANGIOGRAPHY;  Surgeon: [name], MD;  Location: [place] [facility];  Service: Cardiovascular;  Laterality: N/A;  . TEE WITHOUT CARDIOVERSION N/A 08/03/2014   Procedure: TRANSESOPHAGEAL ECHOCARDIOGRAM (TEE);  Surgeon: [name], MD;  Location: [place];  Service: Open Heart Surgery;  Laterality: N/A;  . TEE WITHOUT CARDIOVERSION N/A 02/10/2018   Procedure: TRANSESOPHAGEAL ECHOCARDIOGRAM (TEE);  Surgeon: [name], MD;  Location: [facility] ENDOSCOPY;  Service: Cardiovascular;  Laterality: N/A;  . TEE WITHOUT CARDIOVERSION N/A 11/09/2019   Procedure: TRANSESOPHAGEAL ECHOCARDIOGRAM (TEE);  Surgeon: [name], MD;  Location: [facility] ENDOSCOPY;  Service: Cardiovascular;  Laterality: N/A;    There were no vitals filed for this visit.   Subjective Assessment - 04/02/20 1211    Subjective Patient brought by his sister, [name], who stated she was concerned he may not be able to do much because of the injury to his left thigh. She was able to stay to provide more information. States patient did not seem to have increased pain there following last PT session. Report he has a doctor's appointment this week or next week where the thigh can be addressed and he also has a foot doctor appointment coming up. Reports she has not seen much change in pt's balance or funcitonal abiltiy at home since starting PT. Patient states his L thigh hurt when he tries to get up but feels okay after that and is willing to participate in PT. He tried to take his hearing aides off due to being worried they will get caught in mask but agreed to wear them when PT requested it to improve communication which was poor quality last session without hearing aides.    Patient is accompained by: Family member    Pertinent History Patient is a 69 y.o. male who presents to outpatient physical therapy with a referral for medical diagnosis unsteadiness on feet, dizziness and giddiness. This patient's chief complaints consist of feeling unsteady on feet and occasional falling leading to the following functional deficits: difficulty with basic mobility including household and community ambulation, frequent injuries and pain from falling, completing ADLs, IADLs, safety and quality of life. Relevant  past medical history and comorbidities are extensive (see above) with particular relevance of cognitive impairment/decline, repeated L femur fracture, presence of AICD, low blood pressure, former smoker, MI with 5x bipass, ischemic cardiomyopathy, CHF, chronic Afib with recent need for cardioversion, neck pain, leg pain, DVT in left arm (recently cleared), cellulitis of L LE (recently treated).  Patient denies hx of cancer, stroke, seizures, lung problem, diabetes, unexplained weight loss.    Limitations Lifting;Standing;Walking;House hold activities    Diagnostic tests US venous Img Upper Uni Lft report 11/20/2019: "IMPRESSION:Nonocclusive thrombus within the left brachial vein near theantecubital fossa" resolved per note on 5/27/2021CT Lumbar spine report 06/19/2020:"IMPRESSION:1. Healing right sacral ala fracture. This likely contributes to thepatient's right-sided hip pain.2. Dynamic listhesis with mild subarticular and foraminal stenosisbilaterally at L4-5. The stenosis is likely worse with standing andflexion.3. Mild right foraminal narrowing at L2-3.4. Broad-based disc protrusion at L2-3 and L3-4 extends into theforamina bilaterally both levels without other significant stenosis.5. Moderate left and mild right foraminal stenosis at L5-S1secondary to chronic broad-based disc protrusion and facethypertrophy. Mild left subarticular narrowing is present as well.6.  Aortic Atherosclerosis (ICD10-I70.0)."  Currently in Pain? Yes    Pain Location Leg   only when getting up           TREATMENT: Recent DVT in left elbow region High fall risk! Cognitive deficits  Neuromuscular Re-education:to improve, balance, postural strength, muscle activation patterns, and stabilization strength required for functional activities, SBA - min A provided for all standing tasks for safey:  Standing on Airex pad (compliant surface), bar in front of patient for UE support as needed, encouraged no hands during  activities, min A to prevent fall ~ 4 times:  - horizontal head turnsfollowing ball held in both hands,self-selected tonarrow stance,2x 20 - vertical head nodsfollowing ball held in bothhands,self-selected tonarrow stance,2x 20  Ambulation backwards with SPC 4x30 feet with CGA-minA for safety.   Standing near TM bar for intermittant UE support/safetyand min A to prevent occasional falls: - side stepping over two yoga blocks lined up in their edge to create a low barrier,1x20 each way  - forward/backward stepping over two yoga blocks lined up in their edge to create a low barrier,1x20 leading with each foot.  Pt required multimodal cuing for proper technique and to facilitate improved neuromuscular control, strength, range of motion, and functional ability resulting in improved performance and form.     PT Education - 04/02/20 1214    Education Details exercise purpose/form, posture.    Person(s) Educated Patient    Methods Explanation;Demonstration;Tactile cues;Verbal cues    Comprehension Verbalized understanding;Returned demonstration;Verbal cues required;Tactile cues required;Need further instruction            PT Short Term Goals - 03/14/20 1106      PT SHORT TERM GOAL #1   Title Patient/family indepndent with initial home exercise program for self-management of symptoms.    Baseline to be established as safe and appropriate visit 2 (02/08/2020); Patient requires step by step cuing to perform exercises safey with adequate guarding; family does not attend sessions, will continue to assess for ability to safely perform HEP (03/14/2020);    Time 2    Period Weeks    Status On-going    Target Date 02/23/20             PT Long Term Goals - 03/14/20 1107      PT LONG TERM GOAL #1   Title Family/patient is independent with a long-term home exercise program for self-management of symptoms.    Baseline to be established as safe and appropriate visit 2  (02/08/2020); Patient requires step by step cuing to perform exercises safey with adequate guarding; family does not attend sessions, will continue to assess for ability to safely perform HEP (03/14/2020);    Time 12    Period Weeks    Status On-going   TARGET DATE FOR ALL LONG TERM GOALS: 05/02/2020     PT LONG TERM GOAL #2   Title Patient will score equal or more than 25/30 on Functional Gait Assessment to demonstrate low fall risk.    Baseline to be tested visit 2 (02/08/2020); 14/30 (02/13/2020); 12/30 (03/14/2020);    Time 12    Period Weeks    Status On-going      PT LONG TERM GOAL #3   Title Patient will demonstrate the ability to complete sequential tapping steps on 8 inch step independently to demonstrate improved ability to navigate around objects without falling.    Baseline required support and repeated instructions (02/08/2020); able to complete with CGA and no loss of balance (03/14/2020):    Time  12    Period Weeks    Status Partially Met      PT LONG TERM GOAL #4   Title Patient will demonstrated ability to complete Five Time Sit to Stand Test in equal or less than 12 seconds from 18.5 inch surface without UE support to demonstrate improved balance and safety with transfers.    Baseline 17 seconds from 18.5 inch plinth, initial hand use. . (02/08/2020); 15 seconds from 18.5 inch plinth,No UE use. (03/14/2020);    Time 12    Period Weeks    Status Partially Met      PT LONG TERM GOAL #5   Title Patient will report less than 1 fall over 8 weeks to demonstrate improved safety with functional mobility.    Baseline at least 5 falls in the last 6 months (02/08/2020); reports no falls over the last 5 weeks (03/14/2020);    Time 12    Period Weeks    Status Partially Met                 Plan - 04/02/20 1219    Clinical Impression Statement Patient tolerated treatment well overall, reporting some discomfort and decreased hip flexion ROM when lifting L leg over yoga blocks and  when rising after sitting breaks. Reported pain only when he rises when questioned. Patient had a lot more difficulty with stepping exercise this session after not completing in for several weeks. He improved his backwards walking confidence with repetition. Patient would benefit from continued management of limiting condition by skilled physical therapist to address remaining impairments and functional limitations to work towards stated goals and return to PLOF or maximal functional independence.    Personal Factors and Comorbidities Age;Time since onset of injury/illness/exacerbation;Behavior Pattern;Comorbidity 3+;Education;Social Background;Past/Current Experience;Fitness;Other    Comorbidities Relevant past medical history and comorbidities are extensive (see above) with particular relevance of cognitive impairment/decline, repeated L femur fracture, presence of AICD, low blood pressure, former smoker, MI with 5x bipass, ischemic cardiomyopathy, CHF, chronic Afib with recent need for cardioversion, neck pain, leg pain, DVT in left arm (recently cleared), cellulitis of L LE (recently treated).    Examination-Activity Limitations Carry;Stairs;Sit;Reach Overhead;Locomotion Level;Stand;Lift;Dressing;Bend;Transfers;Other    Examination-Participation Restrictions Community Activity;Cleaning;Meal Prep;Medication Management;Driving;Shop;Yard Work    [employer] Evolving/Moderate complexity    Rehab Potential Fair    PT Frequency 2x / week    PT Duration 12 weeks    PT Treatment/Interventions ADLs/Self Care Home Management;Cryotherapy;Canalith Repostioning;Moist Heat;Gait training;Stair training;Functional mobility training;Therapeutic activities;Therapeutic exercise;Balance training;Neuromuscular re-education;Cognitive remediation;Patient/family education;Manual techniques;Dry needling;Vestibular;Visual/perceptual remediation/compensation;Joint Manipulations;Spinal Manipulations    PT  Next Visit Plan progressive balance training, HEP as safe    PT Home Exercise Plan to be established as appropriate based on cognition and safety    Consulted and Agree with Plan of Care Patient           Patient will benefit from skilled therapeutic intervention in order to improve the following deficits and impairments:  Abnormal gait, Decreased cognition, Decreased knowledge of use of DME, Dizziness, Improper body mechanics, Pain, Impaired tone, Decreased coordination, Decreased mobility, Decreased activity tolerance, Decreased endurance, Difficulty walking, Decreased safety awareness, Decreased knowledge of precautions, Decreased balance  Visit Diagnosis: Difficulty in walking, not elsewhere classified  Unsteadiness on feet  Dizziness and giddiness     Problem List Patient Active Problem List   Diagnosis Date Noted  . Persistent atrial fibrillation ([place]) 11/30/2019  . Secondary hypercoagulable state ([place]) 11/30/2019  . Arm DVT (deep venous thromboembolism), acute, left (  [place]) 11/26/2019  . Hx of adenomatous colonic polyps 05/18/2019  . Cirrhosis, non-alcoholic ([place]) 62/26/3335  . Occult blood positive stool 05/12/2019  . HCAP (healthcare-associated pneumonia) 03/04/2019  . Hallucinations 02/10/2019  . Elevated blood sugar 01/27/2019  . S/P mitral valve repair 12/31/2018  . Bilateral pulmonary contusion 10/17/2018  . Cardiac LV ejection fraction of 20-34% 10/17/2018  . Chronic systolic CHF (congestive heart failure) ([place]) 10/17/2018  . History of ischemic cardiomyopathy 10/17/2018  . ICD (implantable cardioverter-defibrillator) in place 10/17/2018  . S/p left hip fracture 10/17/2018  . Traumatic retroperitoneal hematoma 10/17/2018  . Age-related osteoporosis with current pathological fracture with routine healing 07/28/2018  . CAD (coronary artery disease) 07/21/2018  . GERD (gastroesophageal reflux disease) 07/21/2018  . Depression 07/21/2018  . CKD (chronic kidney  disease), stage III 07/21/2018  . Iron deficiency anemia 07/21/2018  . Hypokalemia 07/21/2018  . Closed left hip fracture ([place]) 07/21/2018  . Fracture of femoral neck, left ([place]) 07/21/2018  . Lower extremity pain, bilateral 11/12/2017  . 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 Hyperlipidemia 09/01/2017  . Essential hypertension 09/01/2017  . PAD (peripheral artery disease) ([place]) 09/01/2017  . Anemia, unspecified 08/06/2017  . GAD (generalized anxiety disorder) 06/18/2017  . Prostate cancer screening 04/28/2017  . Hydrocele 04/28/2017  . Bradycardia   . Premature ventricular contraction   . Severe mitral insufficiency   . Cardiomyopathy, ischemic 04/26/2015  . Difficulty hearing 12/29/2014  . Acute on chronic systolic CHF (congestive heart failure) ([place]) 10/10/2014  . 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 Protein-calorie malnutrition, severe ([place]) 07/29/2014  . AKI (acute kidney injury) ([place]) 07/29/2014    Everlean Alstrom. Graylon Good, PT, DPT 04/02/20, 12:20 PM  [facility] [address], [place], [address] Phone: [phone]   Fax:  [phone]  Name: FAYE SANFILIPPO MRN: [id number] Date of Birth: 02-06-1951

## 2020-04-04 ENCOUNTER — Ambulatory Visit: Payer: Medicare Other | Admitting: Physical Therapy

## 2020-04-05 ENCOUNTER — Telehealth: Payer: Self-pay | Admitting: Physical Therapy

## 2020-04-05 NOTE — Telephone Encounter (Signed)
Called pt's sister [name] to inquire about him calling to cancel his appt on Monday due to thinking he may have broken his leg. No answer. Left VM. Call back number [phone].   Everlean Alstrom. Graylon Good, PT, DPT 04/05/20, 8:57 AM

## 2020-04-09 ENCOUNTER — Ambulatory Visit: Payer: Medicare Other | Admitting: Physical Therapy

## 2020-04-09 ENCOUNTER — Encounter: Payer: Self-pay | Admitting: Physical Therapy

## 2020-04-09 ENCOUNTER — Other Ambulatory Visit: Payer: Self-pay

## 2020-04-09 DIAGNOSIS — R262 Difficulty in walking, not elsewhere classified: Secondary | ICD-10-CM

## 2020-04-09 DIAGNOSIS — R42 Dizziness and giddiness: Secondary | ICD-10-CM

## 2020-04-09 DIAGNOSIS — R2681 Unsteadiness on feet: Secondary | ICD-10-CM

## 2020-04-09 NOTE — Therapy (Signed)
[facility] [address] [place], [address] Phone: [phone]   Fax:  [phone]  Physical Therapy Treatment  Patient Details  Name: Carl [name] MRN: [id number] Date of Birth: 1951-02-08 Referring Provider (PT): [name], NP   Encounter Date: 04/09/2020   PT End of Session - 04/09/20 1401    Visit Number 13    Number of Visits 24    Date for PT Re-Evaluation 05/02/20    Authorization Type [employer] Medicare Reporting period from 02/08/2020    Progress Note Due on Visit 20    PT Start Time 1138    PT Stop Time 1203    PT Time Calculation (min) 25 min    Equipment Utilized During Treatment Gait belt    Activity Tolerance Patient tolerated treatment well    Behavior During Therapy [facility] for tasks assessed/performed           Past Medical History:  Diagnosis Date  . AICD (automatic cardioverter/defibrillator) present   . Anemia   . Anxiety   . Arthritis   . Brunner's gland hyperplasia of duodenum   . CHF (congestive heart failure) ([place])   . Coronary artery disease   . External hemorrhoids   . Fracture of femoral neck, left ([place]) 07/21/2018  . GERD (gastroesophageal reflux disease)   . Hearing loss   . HH (hiatus hernia)   . Internal hemorrhoids   . Ischemic cardiomyopathy   . Leg fracture, left   . Myocardial infarction ([place])   . Paronychia   . Pneumonia   . Psoriasis   . PUD (peptic ulcer disease)   . Schatzki's ring   . Tubular adenoma of colon   . Vitiligo     Past Surgical History:  Procedure Laterality Date  . ATRIAL SEPTAL DEFECT(ASD) CLOSURE N/A 12/30/2018   Procedure: ATRIAL SEPTAL DEFECT(ASD) CLOSURE;  Surgeon: [name], MD;  Location: [place] CV LAB;  Service: Cardiovascular;  Laterality: N/A;  . CARDIAC SURGERY    . CARDIOVERSION N/A 11/09/2019   Procedure: CARDIOVERSION;  Surgeon: [name], MD;  Location: [facility] ENDOSCOPY;  Service: Cardiovascular;   Laterality: N/A;  . CARDIOVERSION N/A 12/21/2019   Procedure: CARDIOVERSION;  Surgeon: [name], MD;  Location: [facility] ENDOSCOPY;  Service: Cardiovascular;  Laterality: N/A;  . COLONOSCOPY WITH ESOPHAGOGASTRODUODENOSCOPY (EGD)    . COLONOSCOPY WITH PROPOFOL N/A 08/24/2017   Procedure: COLONOSCOPY WITH PROPOFOL;  Surgeon: [name], MD;  Location: [facility] ENDOSCOPY;  Service: Endoscopy;  Laterality: N/A;  . CORONARY ARTERY BYPASS GRAFT N/A 08/03/2014   Procedure: CORONARY ARTERY BYPASS GRAFTING (CABG);  Surgeon: [name], MD;  Location: [place];  Service: Open Heart Surgery;  Laterality: N/A;  . EP IMPLANTABLE DEVICE N/A 05/01/2015   MDT ICD implanted for primary prevention of sudden death  . ESOPHAGOGASTRODUODENOSCOPY (EGD) WITH PROPOFOL N/A 04/16/2015   Procedure: ESOPHAGOGASTRODUODENOSCOPY (EGD) WITH PROPOFOL;  Surgeon: [name], MD;  Location: [facility] ENDOSCOPY;  Service: Endoscopy;  Laterality: N/A;  . ESOPHAGOGASTRODUODENOSCOPY (EGD) WITH PROPOFOL N/A 08/24/2017   Procedure: ESOPHAGOGASTRODUODENOSCOPY (EGD) WITH PROPOFOL;  Surgeon: [name], MD;  Location: [facility] ENDOSCOPY;  Service: Endoscopy;  Laterality: N/A;  . FRACTURE SURGERY    . HERNIA REPAIR    . HIP PINNING,CANNULATED Left 07/22/2018   Procedure: CANNULATED HIP PINNING;  Surgeon: [name], MD;  Location: [place];  Service: Orthopedics;  Laterality: Left;  . MITRAL VALVE REPAIR N/A 12/30/2018   Procedure:  MITRAL VALVE REPAIR;  Surgeon: [name], MD;  Location: [place] [facility];  Service: Cardiovascular;  Laterality: N/A;  . RIGHT/LEFT HEART CATH AND CORONARY/GRAFT ANGIOGRAPHY N/A 12/21/2018   Procedure: RIGHT/LEFT HEART CATH AND CORONARY/GRAFT ANGIOGRAPHY;  Surgeon: [name], MD;  Location: [place] [facility] LAB;  Service: Cardiovascular;  Laterality: N/A;  . TEE WITHOUT CARDIOVERSION N/A 08/03/2014   Procedure: TRANSESOPHAGEAL ECHOCARDIOGRAM (TEE);  Surgeon: [name], MD;  Location: [place];  Service: Open Heart Surgery;  Laterality: N/A;  . TEE WITHOUT CARDIOVERSION N/A 02/10/2018   Procedure: TRANSESOPHAGEAL ECHOCARDIOGRAM (TEE);  Surgeon: [name], MD;  Location[facility] ENDOSCOPY;  Service: Cardiovascular;  Laterality: N/A;  . TEE WITHOUT CARDIOVERSION N/A 11/09/2019   Procedure: TRANSESOPHAGEAL ECHOCARDIOGRAM (TEE);  Surgeon: [name], MD;  Location: [facility] ENDOSCOPY;  Service: Cardiovascular;  Laterality: N/A;    There were no vitals filed for this visit.   Subjective Assessment - 04/09/20 1309    Subjective Patient intially arrived at [facility] brought by his sister, Carl [name]. Explained his appt had been canceled by office staff after they though he asked to cancel that appt last week due to being concerned he broke his leg. Carl [name] said she doesn't think he broke his leg but did mention a fall about 2 weeks ago that pt did not report when asked. Advised patient to return at 11:15 if able and he and Carl [name] aggreed. Arrived at 11:38 stating they thought the appt time was 11:30. Patient states he has a little pain in his ankle that is normal for him.    Patient is accompained by: Family member    Pertinent History Patient is a 69 y.o. male who presents to outpatient physical therapy with a referral for medical diagnosis unsteadiness on feet, dizziness and giddiness. This patient's chief complaints consist of feeling unsteady on feet and occasional falling leading to the following functional deficits: difficulty with basic mobility including household and community ambulation, frequent injuries and pain from falling, completing ADLs, IADLs, safety and quality of life. Relevant past medical history and comorbidities are extensive (see above) with particular relevance of cognitive impairment/decline, repeated L femur fracture, presence of AICD, low blood pressure, former smoker, MI with 5x bipass, ischemic cardiomyopathy, CHF, chronic Afib with recent need for cardioversion, neck pain, leg  pain, DVT in left arm (recently cleared), cellulitis of L LE (recently treated).  Patient denies hx of cancer, stroke, seizures, lung problem, diabetes, unexplained weight loss.    Limitations Lifting;Standing;Walking;House hold activities    Diagnostic tests US venous Img [facility] report 11/20/2019: "IMPRESSION:Nonocclusive thrombus within the left brachial vein near theantecubital fossa" resolved per note on 5/27/2021CT Lumbar spine report 06/19/2020:"IMPRESSION:1. Healing right sacral ala fracture. This likely contributes to thepatient's right-sided hip pain.2. Dynamic listhesis with mild subarticular and foraminal stenosisbilaterally at L4-5. The stenosis is likely worse with standing andflexion.3. Mild right foraminal narrowing at L2-3.4. Broad-based disc protrusion at L2-3 and L3-4 extends into theforamina bilaterally both levels without other significant stenosis.5. Moderate left and mild right foraminal stenosis at L5-S1secondary to chronic broad-based disc protrusion and facethypertrophy. Mild left subarticular narrowing is present as well.6.  Aortic Atherosclerosis (ICD10-I70.0)."    Currently in Pain? Yes    Pain Score --   mild   Pain Location --   ankle          TREATMENT: Recent DVT in left elbow region High fall risk! Cognitive deficits  Neuromuscular Re-education:to improve, balance, postural strength, muscle activation patterns, and  stabilization strength required for functional activities, SBA - min A provided for all standing tasks for safey:  Standing on Airex pad (compliant surface), bar in front of patient for UE support as needed, encouraged no hands during activities, min A to prevent fall ~4times:  - horizontal head turnsfollowing ball held in both hands,self-selected tonarrow stance,2x 20 - vertical head nodsfollowing ball held in bothhands,self-selected tonarrow stance,2x 20  Ambulation backwards with SPC 4x30 feet with CGA-minA for safety.    Standing near TM bar for intermittant UE support/safetyand min A to prevent occasional falls: - side stepping over two yoga blocks lined upin their edgeto create a low barrier,1x20 each way  - forward/backward stepping over two yoga blocks lined upin their edgeto create a low barrier,1x20 leading with each foot.  Pt required multimodal cuing for proper technique and to facilitate improved neuromuscular control, strength, range of motion, and functional ability resulting in improved performance and form.    PT Education - 04/09/20 1400    Education Details exercise purpose/form, posture. assessment and instructions regarding integrity of leg bones    Person(s) Educated Patient;Caregiver(s)   sister, Carl [name]   Comprehension Verbalized understanding;Returned demonstration;Verbal cues required;Tactile cues required;Need further instruction;Other (comment)   patient able to respond to cuing in short term           PT Short Term Goals - 03/14/20 1106      PT SHORT TERM GOAL #1   Title Patient/family indepndent with initial home exercise program for self-management of symptoms.    Baseline to be established as safe and appropriate visit 2 (02/08/2020); Patient requires step by step cuing to perform exercises safey with adequate guarding; family does not attend sessions, will continue to assess for ability to safely perform HEP (03/14/2020);    Time 2    Period Weeks    Status On-going    Target Date 02/23/20             PT Long Term Goals - 03/14/20 1107      PT LONG TERM GOAL #1   Title Family/patient is independent with a long-term home exercise program for self-management of symptoms.    Baseline to be established as safe and appropriate visit 2 (02/08/2020); Patient requires step by step cuing to perform exercises safey with adequate guarding; family does not attend sessions, will continue to assess for ability to safely perform HEP (03/14/2020);    Time 12    Period  Weeks    Status On-going   TARGET DATE FOR ALL LONG TERM GOALS: 05/02/2020     PT LONG TERM GOAL #2   Title Patient will score equal or more than 25/30 on Functional Gait Assessment to demonstrate low fall risk.    Baseline to be tested visit 2 (02/08/2020); 14/30 (02/13/2020); 12/30 (03/14/2020);    Time 12    Period Weeks    Status On-going      PT LONG TERM GOAL #3   Title Patient will demonstrate the ability to complete sequential tapping steps on 8 inch step independently to demonstrate improved ability to navigate around objects without falling.    Baseline required support and repeated instructions (02/08/2020); able to complete with CGA and no loss of balance (03/14/2020):    Time 12    Period Weeks    Status Partially Met      PT LONG TERM GOAL #4   Title Patient will demonstrated ability to complete Five Time Sit to Stand Test in equal or less  than 12 seconds from 18.5 inch surface without UE support to demonstrate improved balance and safety with transfers.    Baseline 17 seconds from 18.5 inch plinth, initial hand use. . (02/08/2020); 15 seconds from 18.5 inch plinth,No UE use. (03/14/2020);    Time 12    Period Weeks    Status Partially Met      PT LONG TERM GOAL #5   Title Patient will report less than 1 fall over 8 weeks to demonstrate improved safety with functional mobility.    Baseline at least 5 falls in the last 6 months (02/08/2020); reports no falls over the last 5 weeks (03/14/2020);    Time 12    Period Weeks    Status Partially Met                 Plan - 04/09/20 1408    Clinical Impression Statement Patient tolerated treatment well overall with no complaint of pain during exercise. Does demonstrate increased time and mildly antalgic motion favoring L LE during sit <> stand transfer. While it is possible to have a femur fracture that goes undetected without imaging, pt's clinical presentation does not suggest any fractures. Explained this to pt and sister Carl [name]  and advised that if they felt he had a fracture then immediate trip to the emergency department would be advised. Carl [name] did not feel this was necessary and pt continued to be confused. He reported no change in ankle pain by end of session and stated he was unconcerned by the pain by the end of the session. Overall, patient asks questions and brings up concerns inappropriate to the setting or context suggesting poor cognition that does not allow carry over to be able to safely perform HEP or complete exercises without supervision. Patient also asks for help with his electronic watch and other non-therapy assistance and requires re-direction to stay on task for interventions to improve balance and function. Patient also requires constant attendance and guarding to prevent falls when not holding his cane or sitting. Patient appears to have poor vision and communication is impaired by poor hearing (although pt did wear hearing aides this session). Patient would benefit from continued management of limiting condition by skilled physical therapist to address remaining impairments and functional limitations to work towards stated goals and return to PLOF or maximal functional independence.    Personal Factors and Comorbidities Age;Time since onset of injury/illness/exacerbation;Behavior Pattern;Comorbidity 3+;Education;Social Background;Past/Current Experience;Fitness;Other    Comorbidities Relevant past medical history and comorbidities are extensive (see above) with particular relevance of cognitive impairment/decline, repeated L femur fracture, presence of AICD, low blood pressure, former smoker, MI with 5x bipass, ischemic cardiomyopathy, CHF, chronic Afib with recent need for cardioversion, neck pain, leg pain, DVT in left arm (recently cleared), cellulitis of L LE (recently treated).    Examination-Activity Limitations Carry;Stairs;Sit;Reach Overhead;Locomotion Level;Stand;Lift;Dressing;Bend;Transfers;Other     Examination-Participation Restrictions Community Activity;Cleaning;Meal Prep;Medication Management;Driving;Shop;Yard Work    [employer] Evolving/Moderate complexity    Rehab Potential Fair    PT Frequency 2x / week    PT Duration 12 weeks    PT Treatment/Interventions ADLs/Self Care Home Management;Cryotherapy;Canalith Repostioning;Moist Heat;Gait training;Stair training;Functional mobility training;Therapeutic activities;Therapeutic exercise;Balance training;Neuromuscular re-education;Cognitive remediation;Patient/family education;Manual techniques;Dry needling;Vestibular;Visual/perceptual remediation/compensation;Joint Manipulations;Spinal Manipulations    PT Next Visit Plan progressive balance training, HEP as safe    PT Home Exercise Plan to be established as appropriate based on cognition and safety    Consulted and Agree with Plan of Care Patient    Family Member  Consulted sister, Carl [name]           Patient will benefit from skilled therapeutic intervention in order to improve the following deficits and impairments:  Abnormal gait, Decreased cognition, Decreased knowledge of use of DME, Dizziness, Improper body mechanics, Pain, Impaired tone, Decreased coordination, Decreased mobility, Decreased activity tolerance, Decreased endurance, Difficulty walking, Decreased safety awareness, Decreased knowledge of precautions, Decreased balance  Visit Diagnosis: Difficulty in walking, not elsewhere classified  Unsteadiness on feet  Dizziness and giddiness     Problem List Patient Active Problem List   Diagnosis Date Noted  . Persistent atrial fibrillation ([place]) 11/30/2019  . Secondary hypercoagulable state ([place]) 11/30/2019  . Arm DVT (deep venous thromboembolism), acute, left ([place]) 11/26/2019  . Hx of adenomatous colonic polyps 05/18/2019  . Cirrhosis, non-alcoholic ([place]) 03/83/3383  . Occult blood positive stool 05/12/2019  . HCAP (healthcare-associated  pneumonia) 03/04/2019  . Hallucinations 02/10/2019  . Elevated blood sugar 01/27/2019  . S/P mitral valve repair 12/31/2018  . Bilateral pulmonary contusion 10/17/2018  . Cardiac LV ejection fraction of 20-34% 10/17/2018  . Chronic systolic CHF (congestive heart failure) ([place]) 10/17/2018  . History of ischemic cardiomyopathy 10/17/2018  . ICD (implantable cardioverter-defibrillator) in place 10/17/2018  . S/p left hip fracture 10/17/2018  . Traumatic retroperitoneal hematoma 10/17/2018  . Age-related osteoporosis with current pathological fracture with routine healing 07/28/2018  . CAD (coronary artery disease) 07/21/2018  . GERD (gastroesophageal reflux disease) 07/21/2018  . Depression 07/21/2018  . CKD (chronic kidney disease), stage III 07/21/2018  . Iron deficiency anemia 07/21/2018  . Hypokalemia 07/21/2018  . Closed left hip fracture ([place]) 07/21/2018  . Fracture of femoral neck, left ([place]) 07/21/2018  . Lower extremity pain, bilateral 11/12/2017  . 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 Hyperlipidemia 09/01/2017  . Essential hypertension 09/01/2017  . PAD (peripheral artery disease) ([place]) 09/01/2017  . Anemia, unspecified 08/06/2017  . GAD (generalized anxiety disorder) 06/18/2017  . Prostate cancer screening 04/28/2017  . Hydrocele 04/28/2017  . Bradycardia   . Premature ventricular contraction   . Severe mitral insufficiency   . Cardiomyopathy, ischemic 04/26/2015  . Difficulty hearing 12/29/2014  . Acute on chronic systolic CHF (congestive heart failure) ([place]) 10/10/2014  . 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 Protein-calorie malnutrition, severe ([place]) 07/29/2014  . AKI (acute kidney injury) ([place]) 07/29/2014    Everlean Alstrom. Graylon Good, PT, DPT 04/09/20, 2:09 PM  [facility] [address] [place], [address] Phone: [phone]   Fax:  [phone]  Name: VYOM BRASS MRN:  [id number] Date of Birth: 12/07/1950

## 2020-04-11 ENCOUNTER — Other Ambulatory Visit: Payer: Self-pay

## 2020-04-11 ENCOUNTER — Ambulatory Visit: Payer: Medicare Other | Admitting: Physical Therapy

## 2020-04-11 ENCOUNTER — Encounter: Payer: Self-pay | Admitting: Physical Therapy

## 2020-04-11 ENCOUNTER — Telehealth (HOSPITAL_COMMUNITY): Payer: Self-pay | Admitting: *Deleted

## 2020-04-11 DIAGNOSIS — R42 Dizziness and giddiness: Secondary | ICD-10-CM

## 2020-04-11 DIAGNOSIS — R262 Difficulty in walking, not elsewhere classified: Secondary | ICD-10-CM

## 2020-04-11 DIAGNOSIS — R2681 Unsteadiness on feet: Secondary | ICD-10-CM

## 2020-04-11 NOTE — Therapy (Signed)
[facility] [address] [place], [address] Phone: [phone]   Fax:  [phone]  Physical Therapy Treatment  Patient Details  Name: Carl [name] MRN: [id number] Date of Birth: September 19, 1950 Referring Provider (PT): [name], NP   Encounter Date: 04/11/2020   PT End of Session - 04/11/20 0905    Visit Number 14    Number of Visits 24    Date for PT Re-Evaluation 05/02/20    Authorization Type [employer] Medicare Reporting period from 02/08/2020    Progress Note Due on Visit 20    PT Start Time 0900    PT Stop Time 0940    PT Time Calculation (min) 40 min    Equipment Utilized During Treatment Gait belt    Activity Tolerance Patient tolerated treatment well    Behavior During Therapy [facility] for tasks assessed/performed           Past Medical History:  Diagnosis Date  . AICD (automatic cardioverter/defibrillator) present   . Anemia   . Anxiety   . Arthritis   . Brunner's gland hyperplasia of duodenum   . CHF (congestive heart failure) ([place])   . Coronary artery disease   . External hemorrhoids   . Fracture of femoral neck, left ([place]) 07/21/2018  . GERD (gastroesophageal reflux disease)   . Hearing loss   . HH (hiatus hernia)   . Internal hemorrhoids   . Ischemic cardiomyopathy   . Leg fracture, left   . Myocardial infarction ([place])   . Paronychia   . Pneumonia   . Psoriasis   . PUD (peptic ulcer disease)   . Schatzki's ring   . Tubular adenoma of colon   . Vitiligo     Past Surgical History:  Procedure Laterality Date  . ATRIAL SEPTAL DEFECT(ASD) CLOSURE N/A 12/30/2018   Procedure: ATRIAL SEPTAL DEFECT(ASD) CLOSURE;  Surgeon: [name], MD;  Location: [place] CV LAB;  Service: Cardiovascular;  Laterality: N/A;  . CARDIAC SURGERY    . CARDIOVERSION N/A 11/09/2019   Procedure: CARDIOVERSION;  Surgeon: [name], MD;  Location: [facility] ENDOSCOPY;  Service: Cardiovascular;   Laterality: N/A;  . CARDIOVERSION N/A 12/21/2019   Procedure: CARDIOVERSION;  Surgeon: [name], MD;  Location: [facility] ENDOSCOPY;  Service: Cardiovascular;  Laterality: N/A;  . COLONOSCOPY WITH ESOPHAGOGASTRODUODENOSCOPY (EGD)    . COLONOSCOPY WITH PROPOFOL N/A 08/24/2017   Procedure: COLONOSCOPY WITH PROPOFOL;  Surgeon: [name], MD;  Location: [facility] ENDOSCOPY;  Service: Endoscopy;  Laterality: N/A;  . CORONARY ARTERY BYPASS GRAFT N/A 08/03/2014   Procedure: CORONARY ARTERY BYPASS GRAFTING (CABG);  Surgeon: [name], MD;  Location: [place];  Service: Open Heart Surgery;  Laterality: N/A;  . EP IMPLANTABLE DEVICE N/A 05/01/2015   MDT ICD implanted for primary prevention of sudden death  . ESOPHAGOGASTRODUODENOSCOPY (EGD) WITH PROPOFOL N/A 04/16/2015   Procedure: ESOPHAGOGASTRODUODENOSCOPY (EGD) WITH PROPOFOL;  Surgeon: [name], MD;  Location: [facility] ENDOSCOPY;  Service: Endoscopy;  Laterality: N/A;  . ESOPHAGOGASTRODUODENOSCOPY (EGD) WITH PROPOFOL N/A 08/24/2017   Procedure: ESOPHAGOGASTRODUODENOSCOPY (EGD) WITH PROPOFOL;  Surgeon: [name], MD;  Location: [facility] ENDOSCOPY;  Service: Endoscopy;  Laterality: N/A;  . FRACTURE SURGERY    . HERNIA REPAIR    . HIP PINNING,CANNULATED Left 07/22/2018   Procedure: CANNULATED HIP PINNING;  Surgeon: [name], MD;  Location: [place];  Service: Orthopedics;  Laterality: Left;  . MITRAL VALVE REPAIR N/A 12/30/2018   Procedure:  MITRAL VALVE REPAIR;  Surgeon: [name], MD;  Location: [place] CV LAB;  Service: Cardiovascular;  Laterality: N/A;  . RIGHT/LEFT HEART CATH AND CORONARY/GRAFT ANGIOGRAPHY N/A 12/21/2018   Procedure: RIGHT/LEFT HEART CATH AND CORONARY/GRAFT ANGIOGRAPHY;  Surgeon: [name], MD;  Location: [place] CV LAB;  Service: Cardiovascular;  Laterality: N/A;  . TEE WITHOUT CARDIOVERSION N/A 08/03/2014   Procedure: TRANSESOPHAGEAL ECHOCARDIOGRAM (TEE);  Surgeon: [name], MD;  Location: [place];  Service: Open Heart Surgery;  Laterality: N/A;  . TEE WITHOUT CARDIOVERSION N/A 02/10/2018   Procedure: TRANSESOPHAGEAL ECHOCARDIOGRAM (TEE);  Surgeon: [name], MD;  Location: [facility] ENDOSCOPY;  Service: Cardiovascular;  Laterality: N/A;  . TEE WITHOUT CARDIOVERSION N/A 11/09/2019   Procedure: TRANSESOPHAGEAL ECHOCARDIOGRAM (TEE);  Surgeon: [name], MD;  Location: [facility] ENDOSCOPY;  Service: Cardiovascular;  Laterality: N/A;    There were no vitals filed for this visit.   Subjective Assessment - 04/11/20 0902    Subjective Patient dropped off by sister. He states he saw Dr. Ginette Pitman who did not say much about his left leg bruise. He reports no concerns that the doctor had. He reports no current pain. denies falls since last treatment session. He reports he fell on the curb at home 1 saturday ago and hurt his proximal left thigh.    Patient is accompained by: Family member    Pertinent History Patient is a 69 y.o. male who presents to outpatient physical therapy with a referral for medical diagnosis unsteadiness on feet, dizziness and giddiness. This patient's chief complaints consist of feeling unsteady on feet and occasional falling leading to the following functional deficits: difficulty with basic mobility including household and community ambulation, frequent injuries and pain from falling, completing ADLs, IADLs, safety and quality of life. Relevant past medical history and comorbidities are extensive (see above) with particular relevance of cognitive impairment/decline, repeated L femur fracture, presence of AICD, low blood pressure, former smoker, MI with 5x bipass, ischemic cardiomyopathy, CHF, chronic Afib with recent need for cardioversion, neck pain, leg pain, DVT in left arm (recently cleared), cellulitis of L LE (recently treated).  Patient denies hx of cancer, stroke, seizures, lung problem, diabetes, unexplained weight loss.    Limitations Lifting;Standing;Walking;House hold  activities    Diagnostic tests US venous Img Upper Uni Lft report 11/20/2019: "IMPRESSION:Nonocclusive thrombus within the left brachial vein near theantecubital fossa" resolved per note on 5/27/2021CT Lumbar spine report 06/19/2020:"IMPRESSION:1. Healing right sacral ala fracture. This likely contributes to thepatient's right-sided hip pain.2. Dynamic listhesis with mild subarticular and foraminal stenosisbilaterally at L4-5. The stenosis is likely worse with standing andflexion.3. Mild right foraminal narrowing at L2-3.4. Broad-based disc protrusion at L2-3 and L3-4 extends into theforamina bilaterally both levels without other significant stenosis.5. Moderate left and mild right foraminal stenosis at L5-S1secondary to chronic broad-based disc protrusion and facethypertrophy. Mild left subarticular narrowing is present as well.6.  Aortic Atherosclerosis (ICD10-I70.0)."    Currently in Pain? No/denies               TREATMENT: Recent DVT in left elbow region High fall risk! Cognitive deficits  Neuromuscular Re-education:to improve, balance, postural strength, muscle activation patterns, and stabilization strength required for functional activities, SBA - min A provided for all standing tasks for safey: - step up/down to airex pad x20, CGA-min A - side stepping back and forth over airex pad x 20 complete reps. Repeated imbalance and difficulty understanding task at first.   Tandem walking forward and backwards 5x5 feet  each way with R UE support on bar.   Ambulation backwards with SPC 4x30 feet with CGA-minA for safety.  Ambulation around clinic x 500 feet stepping over various obstacles with CGA.   Ambulation through maze created in clinic to simulate curbs to navigate around, x 8 times through. CGA - min A  Pt required multimodal cuing for proper technique and to facilitate improved neuromuscular control, strength, range of motion, and functional ability resulting in improved  performance and form.    PT Education - 04/11/20 0905    Education Details exercise purpose/form, posture.    Person(s) Educated Patient    Methods Explanation;Demonstration;Tactile cues;Verbal cues    Comprehension Verbalized understanding;Returned demonstration;Verbal cues required;Tactile cues required;Need further instruction            PT Short Term Goals - 03/14/20 1106      PT SHORT TERM GOAL #1   Title Patient/family indepndent with initial home exercise program for self-management of symptoms.    Baseline to be established as safe and appropriate visit 2 (02/08/2020); Patient requires step by step cuing to perform exercises safey with adequate guarding; family does not attend sessions, will continue to assess for ability to safely perform HEP (03/14/2020);    Time 2    Period Weeks    Status On-going    Target Date 02/23/20             PT Long Term Goals - 03/14/20 1107      PT LONG TERM GOAL #1   Title Family/patient is independent with a long-term home exercise program for self-management of symptoms.    Baseline to be established as safe and appropriate visit 2 (02/08/2020); Patient requires step by step cuing to perform exercises safey with adequate guarding; family does not attend sessions, will continue to assess for ability to safely perform HEP (03/14/2020);    Time 12    Period Weeks    Status On-going   TARGET DATE FOR ALL LONG TERM GOALS: 05/02/2020     PT LONG TERM GOAL #2   Title Patient will score equal or more than 25/30 on Functional Gait Assessment to demonstrate low fall risk.    Baseline to be tested visit 2 (02/08/2020); 14/30 (02/13/2020); 12/30 (03/14/2020);    Time 12    Period Weeks    Status On-going      PT LONG TERM GOAL #3   Title Patient will demonstrate the ability to complete sequential tapping steps on 8 inch step independently to demonstrate improved ability to navigate around objects without falling.    Baseline required support and  repeated instructions (02/08/2020); able to complete with CGA and no loss of balance (03/14/2020):    Time 12    Period Weeks    Status Partially Met      PT LONG TERM GOAL #4   Title Patient will demonstrated ability to complete Five Time Sit to Stand Test in equal or less than 12 seconds from 18.5 inch surface without UE support to demonstrate improved balance and safety with transfers.    Baseline 17 seconds from 18.5 inch plinth, initial hand use. . (02/08/2020); 15 seconds from 18.5 inch plinth,No UE use. (03/14/2020);    Time 12    Period Weeks    Status Partially Met      PT LONG TERM GOAL #5   Title Patient will report less than 1 fall over 8 weeks to demonstrate improved safety with functional mobility.    Baseline at  least 5 falls in the last 6 months (02/08/2020); reports no falls over the last 5 weeks (03/14/2020);    Time 12    Period Weeks    Status Partially Met                 Plan - 04/11/20 0959    Clinical Impression Statement Patient tolerated treatment well overall and reported no increased discomfort by end of session. He continues to need step by step cuing and close guarding with intermittent min A to prevent falls during challenging balance activities. Patient would benefit from continued management of limiting condition by skilled physical therapist to address remaining impairments and functional limitations to work towards stated goals and return to PLOF or maximal functional independence.    Personal Factors and Comorbidities Age;Time since onset of injury/illness/exacerbation;Behavior Pattern;Comorbidity 3+;Education;Social Background;Past/Current Experience;Fitness;Other    Comorbidities Relevant past medical history and comorbidities are extensive (see above) with particular relevance of cognitive impairment/decline, repeated L femur fracture, presence of AICD, low blood pressure, former smoker, MI with 5x bipass, ischemic cardiomyopathy, CHF, chronic Afib with  recent need for cardioversion, neck pain, leg pain, DVT in left arm (recently cleared), cellulitis of L LE (recently treated).    Examination-Activity Limitations Carry;Stairs;Sit;Reach Overhead;Locomotion Level;Stand;Lift;Dressing;Bend;Transfers;Other    Examination-Participation Restrictions Community Activity;Cleaning;Meal Prep;Medication Management;Driving;Shop;Yard Work    [employer]chant [employer] officer Evolving/Moderate complexity    Rehab Potential Fair    PT Frequency 2x / week    PT Duration 12 weeks    PT Treatment/Interventions ADLs/Self Care Home Management;Cryotherapy;Canalith Repostioning;Moist Heat;Gait training;Stair training;Functional mobility training;Therapeutic activities;Therapeutic exercise;Balance training;Neuromuscular re-education;Cognitive remediation;Patient/family education;Manual techniques;Dry needling;Vestibular;Visual/perceptual remediation/compensation;Joint Manipulations;Spinal Manipulations    PT Next Visit Plan progressive balance training, HEP as safe    PT Home Exercise Plan to be established as appropriate based on cognition and safety    Consulted and Agree with Plan of Care Patient           Patient will benefit from skilled therapeutic intervention in order to improve the following deficits and impairments:  Abnormal gait, Decreased cognition, Decreased knowledge of use of DME, Dizziness, Improper body mechanics, Pain, Impaired tone, Decreased coordination, Decreased mobility, Decreased activity tolerance, Decreased endurance, Difficulty walking, Decreased safety awareness, Decreased knowledge of precautions, Decreased balance  Visit Diagnosis: Difficulty in walking, not elsewhere classified  Unsteadiness on feet  Dizziness and giddiness     Problem List Patient Active Problem List   Diagnosis Date Noted  . Persistent atrial fibrillation ([place]) 11/30/2019  . Secondary hypercoagulable state ([place]) 11/30/2019  . Arm DVT (deep venous  thromboembolism), acute, left ([place]) 11/26/2019  . Hx of adenomatous colonic polyps 05/18/2019  . Cirrhosis, non-alcoholic ([place]) 16/05/9603  . Occult blood positive stool 05/12/2019  . HCAP (healthcare-associated pneumonia) 03/04/2019  . Hallucinations 02/10/2019  . Elevated blood sugar 01/27/2019  . S/P mitral valve repair 12/31/2018  . Bilateral pulmonary contusion 10/17/2018  . Cardiac LV ejection fraction of 20-34% 10/17/2018  . Chronic systolic CHF (congestive heart failure) ([place]) 10/17/2018  . History of ischemic cardiomyopathy 10/17/2018  . ICD (implantable cardioverter-defibrillator) in place 10/17/2018  . S/p left hip fracture 10/17/2018  . Traumatic retroperitoneal hematoma 10/17/2018  . Age-related osteoporosis with current pathological fracture with routine healing 07/28/2018  . CAD (coronary artery disease) 07/21/2018  . GERD (gastroesophageal reflux disease) 07/21/2018  . Depression 07/21/2018  . CKD (chronic kidney disease), stage III 07/21/2018  . Iron deficiency anemia 07/21/2018  . Hypokalemia 07/21/2018  . Closed left hip fracture ([place]) 07/21/2018  .  Fracture of femoral neck, left ([place]) 07/21/2018  . Lower extremity pain, bilateral 11/12/2017  . 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 Hyperlipidemia 09/01/2017  . Essential hypertension 09/01/2017  . PAD (peripheral artery disease) ([place]) 09/01/2017  . Anemia, unspecified 08/06/2017  . GAD (generalized anxiety disorder) 06/18/2017  . Prostate cancer screening 04/28/2017  . Hydrocele 04/28/2017  . Bradycardia   . Premature ventricular contraction   . Severe mitral insufficiency   . Cardiomyopathy, ischemic 04/26/2015  . Difficulty hearing 12/29/2014  . Acute on chronic systolic CHF (congestive heart failure) ([place]) 10/10/2014  . 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 Protein-calorie malnutrition, severe ([place]) 07/29/2014  . AKI (acute kidney injury) ([place]) 07/29/2014    Everlean Alstrom. Graylon Good, PT,  DPT 04/11/20, 10:02 AM  [facility] [address] [place], [address] Phone: [phone]   Fax:  [phone]  Name: Carl [name] MRN: [id number] Date of Birth: March 12, 1951

## 2020-04-11 NOTE — Telephone Encounter (Signed)
That is ok.  Get BMET in 1 week and followup in HF clinic with NP/PA or me.

## 2020-04-11 NOTE — Telephone Encounter (Signed)
Dr.Hande with internal medicine in [place] called to report pts creatinine was elevated (2.6) so he decreased torsemide to 20mg   every other day from 20mg  daily. Dr.Hande wanted to make sure Dr.McLean agreed with plan.   Routed to [place]   Call back # [phone]

## 2020-04-12 ENCOUNTER — Encounter: Payer: Self-pay | Admitting: Podiatry

## 2020-04-12 ENCOUNTER — Ambulatory Visit (INDEPENDENT_AMBULATORY_CARE_PROVIDER_SITE_OTHER): Payer: Medicare Other | Admitting: Podiatry

## 2020-04-12 ENCOUNTER — Encounter (HOSPITAL_COMMUNITY): Payer: Self-pay

## 2020-04-12 ENCOUNTER — Other Ambulatory Visit (HOSPITAL_COMMUNITY): Payer: Self-pay | Admitting: Cardiology

## 2020-04-12 ENCOUNTER — Other Ambulatory Visit (HOSPITAL_COMMUNITY): Payer: Self-pay

## 2020-04-12 ENCOUNTER — Other Ambulatory Visit: Payer: Self-pay

## 2020-04-12 DIAGNOSIS — I739 Peripheral vascular disease, unspecified: Secondary | ICD-10-CM | POA: Diagnosis not present

## 2020-04-12 DIAGNOSIS — M79675 Pain in left toe(s): Secondary | ICD-10-CM | POA: Diagnosis not present

## 2020-04-12 DIAGNOSIS — D689 Coagulation defect, unspecified: Secondary | ICD-10-CM | POA: Diagnosis not present

## 2020-04-12 DIAGNOSIS — B351 Tinea unguium: Secondary | ICD-10-CM | POA: Diagnosis not present

## 2020-04-12 DIAGNOSIS — M79674 Pain in right toe(s): Secondary | ICD-10-CM

## 2020-04-12 NOTE — Telephone Encounter (Signed)
Would talk with his brother or sister to see what he is really taking.

## 2020-04-12 NOTE — Telephone Encounter (Signed)
According to patients medication list he takes torsemide 20mg  60mg  bid. I tried calling Dr. Kerby Moors but no one answered and I was unable to leave a message.

## 2020-04-12 NOTE — Progress Notes (Signed)
Today had a home visit with [name].  Spoke with Solmon Ice at [facility] HF clinic to ask about Dr Ginette Pitman order to change Bud's torsemide.  I have fixed Owynn's meds for next week with 40 mg torsemide twice daily every other day and 60 mg twice daily the other days.  Per [name] in notes Anel needs blood work and followup with HF clinic.  No appt shown in Epic for this, will notify HF clinic.  Carlie has all his medications.  Did have 1 eliquis in his box that was not took in past 2 weeks.  Discussed food and fluids with him.  He states drinking too much pepsi everyday, explained to him importance of cutting back and drinking more water.  His sister takes him shopping and lets him buy them.  Discussed foods with him, he states he eats out with his sister.  Discussed this with his brother and he is still looking for someone to cook for Zalen to get him to eat at home.  He has no swelling in lower extremities.  He denies fullness in abdominal area.  He denies chest pain or shortness of breath.  Will follow up with him next week with his weight to make sure he is doing ok with decrease of torsemide.  Will continue to visit for heart failure, medication compliance and diet.   [place] [phone]

## 2020-04-12 NOTE — Progress Notes (Signed)
This patient returns to my office for at risk foot care.  This patient requires this care by a professional since this patient will be at risk due to having coagulation defect.   This patient is unable to cut nails himself since the patient cannot reach his nails.These nails are painful walking and wearing shoes.  This patient presents for at risk foot care today.  General Appearance  Alert, conversant and in no acute stress.  Vascular  Dorsalis pedis and posterior tibial  pulses are not  palpable  bilaterally.  Capillary return is within normal limits  bilaterally. Temperature is within normal limits  bilaterally.  Neurologic  Senn-Weinstein monofilament wire test within normal limits  bilaterally. Muscle power within normal limits bilaterally.  Nails Thick disfigured discolored nails with subungual debris  from hallux to fifth toes bilaterally. No evidence of bacterial infection or drainage bilaterally.  Orthopedic  No limitations of motion  feet .  No crepitus or effusions noted.  No bony pathology or digital deformities noted. 1/2 inch limb length.  Skin  normotropic skin with no porokeratosis noted bilaterally.  No signs of infections or ulcers noted.     Onychomycosis  Pain in right toes  Pain in left toes  Consent was obtained for treatment procedures.   Mechanical debridement of nails 1-5  bilaterally performed with a nail nipper.  Filed with dremel without incident. Padding dispensed for painful big toe left foot.   Return office visit   3 months                  Told patient to return for periodic foot care and evaluation due to potential at risk complications.   Gardiner Barefoot DPM

## 2020-04-12 NOTE — Telephone Encounter (Signed)
Per Kristi,EMT(paramedicine) she had him taking 60mg  bid

## 2020-04-13 ENCOUNTER — Other Ambulatory Visit (HOSPITAL_COMMUNITY): Payer: Self-pay | Admitting: Cardiology

## 2020-04-13 NOTE — Telephone Encounter (Signed)
Dropping him to torsemide 20 mg daily is too low.  Would have him take torsemide 60 qam/40 qpm and get BMET in 1 week.

## 2020-04-16 ENCOUNTER — Ambulatory Visit: Payer: Medicare Other | Admitting: Physical Therapy

## 2020-04-16 NOTE — Telephone Encounter (Signed)
Steffanie Dunn advised,pt has ov later this week will draw labs then.

## 2020-04-17 ENCOUNTER — Other Ambulatory Visit (HOSPITAL_COMMUNITY): Payer: Self-pay | Admitting: *Deleted

## 2020-04-17 MED ORDER — APIXABAN 5 MG PO TABS
5.0000 mg | ORAL_TABLET | Freq: Two times a day (BID) | ORAL | 5 refills | Status: DC
Start: 2020-04-17 — End: 2020-07-05

## 2020-04-18 ENCOUNTER — Ambulatory Visit: Payer: Medicare Other | Admitting: Physical Therapy

## 2020-04-18 ENCOUNTER — Other Ambulatory Visit: Payer: Self-pay

## 2020-04-18 ENCOUNTER — Encounter: Payer: Self-pay | Admitting: Physical Therapy

## 2020-04-18 DIAGNOSIS — R262 Difficulty in walking, not elsewhere classified: Secondary | ICD-10-CM

## 2020-04-18 DIAGNOSIS — R42 Dizziness and giddiness: Secondary | ICD-10-CM

## 2020-04-18 DIAGNOSIS — R2681 Unsteadiness on feet: Secondary | ICD-10-CM

## 2020-04-18 NOTE — Therapy (Signed)
[facility] [address] [place], [address] Phone: [phone]   Fax:  [phone]  Physical Therapy Treatment  Patient Details  Name: Carl [name] MRN: [id number] Date of Birth: 11-23-1950 Referring Provider (PT): [name], NP   Encounter Date: 04/18/2020   PT End of Session - 04/18/20 1003    Visit Number 15    Number of Visits 24    Date for PT Re-Evaluation 05/02/20    Authorization Type [employer] Medicare Reporting period from 02/08/2020    Progress Note Due on Visit 20    PT Start Time 0911    PT Stop Time 0950    PT Time Calculation (min) 39 min    Equipment Utilized During Treatment Gait belt    Activity Tolerance Patient tolerated treatment well    Behavior During Therapy [facility] for tasks assessed/performed           Past Medical History:  Diagnosis Date  . AICD (automatic cardioverter/defibrillator) present   . Anemia   . Anxiety   . Arthritis   . Brunner's gland hyperplasia of duodenum   . CHF (congestive heart failure) ([place])   . Coronary artery disease   . External hemorrhoids   . Fracture of femoral neck, left ([place]) 07/21/2018  . GERD (gastroesophageal reflux disease)   . Hearing loss   . HH (hiatus hernia)   . Internal hemorrhoids   . Ischemic cardiomyopathy   . Leg fracture, left   . Myocardial infarction ([place])   . Paronychia   . Pneumonia   . Psoriasis   . PUD (peptic ulcer disease)   . Schatzki's ring   . Tubular adenoma of colon   . Vitiligo     Past Surgical History:  Procedure Laterality Date  . ATRIAL SEPTAL DEFECT(ASD) CLOSURE N/A 12/30/2018   Procedure: ATRIAL SEPTAL DEFECT(ASD) CLOSURE;  Surgeon: [name], MD;  Location: [place] [facility];  Service: Cardiovascular;  Laterality: N/A;  . CARDIAC SURGERY    . CARDIOVERSION N/A 11/09/2019   Procedure: CARDIOVERSION;  Surgeon: [name], MD;  Location: [facility] ENDOSCOPY;  Service: Cardiovascular;   Laterality: N/A;  . CARDIOVERSION N/A 12/21/2019   Procedure: CARDIOVERSION;  Surgeon: [name], MD;  Location: [facility] ENDOSCOPY;  Service: Cardiovascular;  Laterality: N/A;  . COLONOSCOPY WITH ESOPHAGOGASTRODUODENOSCOPY (EGD)    . COLONOSCOPY WITH PROPOFOL N/A 08/24/2017   Procedure: COLONOSCOPY WITH PROPOFOL;  Surgeon: [name], MD;  Location: [facility] ENDOSCOPY;  Service: Endoscopy;  Laterality: N/A;  . CORONARY ARTERY BYPASS GRAFT N/A 08/03/2014   Procedure: CORONARY ARTERY BYPASS GRAFTING (CABG);  Surgeon: [name], MD;  Location: [place];  Service: Open Heart Surgery;  Laterality: N/A;  . EP IMPLANTABLE DEVICE N/A 05/01/2015   MDT ICD implanted for primary prevention of sudden death  . ESOPHAGOGASTRODUODENOSCOPY (EGD) WITH PROPOFOL N/A 04/16/2015   Procedure: ESOPHAGOGASTRODUODENOSCOPY (EGD) WITH PROPOFOL;  Surgeon: [name], MD;  Location: [facility] ENDOSCOPY;  Service: Endoscopy;  Laterality: N/A;  . ESOPHAGOGASTRODUODENOSCOPY (EGD) WITH PROPOFOL N/A 08/24/2017   Procedure: ESOPHAGOGASTRODUODENOSCOPY (EGD) WITH PROPOFOL;  Surgeon: [name], MD;  Location: [facility] ENDOSCOPY;  Service: Endoscopy;  Laterality: N/A;  . FRACTURE SURGERY    . HERNIA REPAIR    . HIP PINNING,CANNULATED Left 07/22/2018   Procedure: CANNULATED HIP PINNING;  Surgeon: [name], MD;  Location: [place];  Service: Orthopedics;  Laterality: Left;  . MITRAL VALVE REPAIR N/A 12/30/2018   Procedure:  MITRAL VALVE REPAIR;  Surgeon: [name], MD;  Location: [place] [facility];  Service: Cardiovascular;  Laterality: N/A;  . RIGHT/LEFT HEART CATH AND CORONARY/GRAFT ANGIOGRAPHY N/A 12/21/2018   Procedure: RIGHT/LEFT HEART CATH AND CORONARY/GRAFT ANGIOGRAPHY;  Surgeon: [name], MD;  Location: [place] [facility] LAB;  Service: Cardiovascular;  Laterality: N/A;  . TEE WITHOUT CARDIOVERSION N/A 08/03/2014   Procedure: TRANSESOPHAGEAL ECHOCARDIOGRAM (TEE);  Surgeon: [name], MD;  Location: [place];  Service: Open Heart Surgery;  Laterality: N/A;  . TEE WITHOUT CARDIOVERSION N/A 02/10/2018   Procedure: TRANSESOPHAGEAL ECHOCARDIOGRAM (TEE);  Surgeon: [name], MD;  Location: [facility] ENDOSCOPY;  Service: Cardiovascular;  Laterality: N/A;  . TEE WITHOUT CARDIOVERSION N/A 11/09/2019   Procedure: TRANSESOPHAGEAL ECHOCARDIOGRAM (TEE);  Surgeon: [name], MD;  Location: [facility] ENDOSCOPY;  Service: Cardiovascular;  Laterality: N/A;    There were no vitals filed for this visit.   Subjective Assessment - 04/18/20 0912    Subjective Patient reports he is feeling okay today except for a bit of back pain that he states is always there and his shoes are uncomfortable. He has some different shoes on today than usual but they doe have outside buildup on the left side. Denies any falls since last sesion and states his leg is no longer hurting. Denies falls. arrived 9 min late with SPC. Patient had canceled earlier in the week due to ongoing nose bleed. Sister dropped him off today without speaking to PT    Patient is accompained by: Family member    Pertinent History Patient is a 69 y.o. male who presents to outpatient physical therapy with a referral for medical diagnosis unsteadiness on feet, dizziness and giddiness. This patient's chief complaints consist of feeling unsteady on feet and occasional falling leading to the following functional deficits: difficulty with basic mobility including household and community ambulation, frequent injuries and pain from falling, completing ADLs, IADLs, safety and quality of life. Relevant past medical history and comorbidities are extensive (see above) with particular relevance of cognitive impairment/decline, repeated L femur fracture, presence of AICD, low blood pressure, former smoker, MI with 5x bipass, ischemic cardiomyopathy, CHF, chronic Afib with recent need for cardioversion, neck pain, leg pain, DVT in left arm (recently cleared), cellulitis of L LE  (recently treated).  Patient denies hx of cancer, stroke, seizures, lung problem, diabetes, unexplained weight loss.    Limitations Lifting;Standing;Walking;House hold activities    Diagnostic tests US venous Img Upper Uni Lft report 11/20/2019: "IMPRESSION:Nonocclusive thrombus within the left brachial vein near theantecubital fossa" resolved per note on 5/27/2021CT Lumbar spine report 06/19/2020:"IMPRESSION:1. Healing right sacral ala fracture. This likely contributes to thepatient's right-sided hip pain.2. Dynamic listhesis with mild subarticular and foraminal stenosisbilaterally at L4-5. The stenosis is likely worse with standing andflexion.3. Mild right foraminal narrowing at L2-3.4. Broad-based disc protrusion at L2-3 and L3-4 extends into theforamina bilaterally both levels without other significant stenosis.5. Moderate left and mild right foraminal stenosis at L5-S1secondary to chronic broad-based disc protrusion and facethypertrophy. Mild left subarticular narrowing is present as well.6.  Aortic Atherosclerosis (ICD10-I70.0)."    Currently in Pain? Other (Comment)   see above           TREATMENT: Recent DVT in left elbow region High fall risk! Cognitive deficits  Neuromuscular Re-education:to improve, balance, postural strength, muscle activation patterns, and stabilization strength required for functional activities, SBA - min A provided for all standing tasks for safey: - step up/down to airex pad x20, CGA-min A -  side stepping back and forth over airex pad x 20 complete reps. Repeated imbalance and difficulty understanding task at first.   Tandem walking forward and backwards 5x5 feet each way with R UE support on high table. CGA - Min A to prevent fall  Ambulation backwards with SPC 4x30 feet with CGA-minA for safety.  Ambulation with SPC around clinic x 550 feet stepping over various obstacles with CGA- minA.   Pt required multimodal cuing for proper technique and to  facilitate improved neuromuscular control, strength, range of motion, and functional ability resulting in improved performance and form.    PT Education - 04/18/20 1003    Education Details exercise form, posture.    Person(s) Educated Patient    Methods Explanation;Demonstration;Verbal cues;Tactile cues    Comprehension Verbalized understanding;Returned demonstration;Tactile cues required;Verbal cues required;Need further instruction            PT Short Term Goals - 03/14/20 1106      PT SHORT TERM GOAL #1   Title Patient/family indepndent with initial home exercise program for self-management of symptoms.    Baseline to be established as safe and appropriate visit 2 (02/08/2020); Patient requires step by step cuing to perform exercises safey with adequate guarding; family does not attend sessions, will continue to assess for ability to safely perform HEP (03/14/2020);    Time 2    Period Weeks    Status On-going    Target Date 02/23/20             PT Long Term Goals - 03/14/20 1107      PT LONG TERM GOAL #1   Title Family/patient is independent with a long-term home exercise program for self-management of symptoms.    Baseline to be established as safe and appropriate visit 2 (02/08/2020); Patient requires step by step cuing to perform exercises safey with adequate guarding; family does not attend sessions, will continue to assess for ability to safely perform HEP (03/14/2020);    Time 12    Period Weeks    Status On-going   TARGET DATE FOR ALL LONG TERM GOALS: 05/02/2020     PT LONG TERM GOAL #2   Title Patient will score equal or more than 25/30 on Functional Gait Assessment to demonstrate low fall risk.    Baseline to be tested visit 2 (02/08/2020); 14/30 (02/13/2020); 12/30 (03/14/2020);    Time 12    Period Weeks    Status On-going      PT LONG TERM GOAL #3   Title Patient will demonstrate the ability to complete sequential tapping steps on 8 inch step independently to  demonstrate improved ability to navigate around objects without falling.    Baseline required support and repeated instructions (02/08/2020); able to complete with CGA and no loss of balance (03/14/2020):    Time 12    Period Weeks    Status Partially Met      PT LONG TERM GOAL #4   Title Patient will demonstrated ability to complete Five Time Sit to Stand Test in equal or less than 12 seconds from 18.5 inch surface without UE support to demonstrate improved balance and safety with transfers.    Baseline 17 seconds from 18.5 inch plinth, initial hand use. . (02/08/2020); 15 seconds from 18.5 inch plinth,No UE use. (03/14/2020);    Time 12    Period Weeks    Status Partially Met      PT LONG TERM GOAL #5   Title Patient will report less  than 1 fall over 8 weeks to demonstrate improved safety with functional mobility.    Baseline at least 5 falls in the last 6 months (02/08/2020); reports no falls over the last 5 weeks (03/14/2020);    Time 12    Period Weeks    Status Partially Met                 Plan - 04/18/20 1006    Clinical Impression Statement Patient tolerated treatment well overall with no complaints of pain. Required several episodes of steadying support to prevent falls. Did have some dried blood under his nose but no signs of active nose bleed. Continues to stumble over obstacles and his feet with improvement with repetition. Patient would benefit from continued management of limiting condition by skilled physical therapist to address remaining impairments and functional limitations to work towards stated goals and return to PLOF or maximal functional independence.    Personal Factors and Comorbidities Age;Time since onset of injury/illness/exacerbation;Behavior Pattern;Comorbidity 3+;Education;Social Background;Past/Current Experience;Fitness;Other    Comorbidities Relevant past medical history and comorbidities are extensive (see above) with particular relevance of cognitive  impairment/decline, repeated L femur fracture, presence of AICD, low blood pressure, former smoker, MI with 5x bipass, ischemic cardiomyopathy, CHF, chronic Afib with recent need for cardioversion, neck pain, leg pain, DVT in left arm (recently cleared), cellulitis of L LE (recently treated).    Examination-Activity Limitations Carry;Stairs;Sit;Reach Overhead;Locomotion Level;Stand;Lift;Dressing;Bend;Transfers;Other    Examination-Participation Restrictions Community Activity;Cleaning;Meal Prep;Medication Management;Driving;Shop;Yard Work    [employer] Evolving/Moderate complexity    Rehab Potential Fair    PT Frequency 2x / week    PT Duration 12 weeks    PT Treatment/Interventions ADLs/Self Care Home Management;Cryotherapy;Canalith Repostioning;Moist Heat;Gait training;Stair training;Functional mobility training;Therapeutic activities;Therapeutic exercise;Balance training;Neuromuscular re-education;Cognitive remediation;Patient/family education;Manual techniques;Dry needling;Vestibular;Visual/perceptual remediation/compensation;Joint Manipulations;Spinal Manipulations    PT Next Visit Plan progressive balance training, HEP as safe    PT Home Exercise Plan to be established as appropriate based on cognition and safety    Consulted and Agree with Plan of Care Patient           Patient will benefit from skilled therapeutic intervention in order to improve the following deficits and impairments:  Abnormal gait, Decreased cognition, Decreased knowledge of use of DME, Dizziness, Improper body mechanics, Pain, Impaired tone, Decreased coordination, Decreased mobility, Decreased activity tolerance, Decreased endurance, Difficulty walking, Decreased safety awareness, Decreased knowledge of precautions, Decreased balance  Visit Diagnosis: Difficulty in walking, not elsewhere classified  Unsteadiness on feet  Dizziness and giddiness     Problem List Patient Active Problem  List   Diagnosis Date Noted  . Persistent atrial fibrillation ([place]) 11/30/2019  . Secondary hypercoagulable state ([place]) 11/30/2019  . Arm DVT (deep venous thromboembolism), acute, left ([place]) 11/26/2019  . Hx of adenomatous colonic polyps 05/18/2019  . Cirrhosis, non-alcoholic ([place]) 30/16/0109  . Occult blood positive stool 05/12/2019  . HCAP (healthcare-associated pneumonia) 03/04/2019  . Hallucinations 02/10/2019  . Elevated blood sugar 01/27/2019  . S/P mitral valve repair 12/31/2018  . Bilateral pulmonary contusion 10/17/2018  . Cardiac LV ejection fraction of 20-34% 10/17/2018  . Chronic systolic CHF (congestive heart failure) ([place]) 10/17/2018  . History of ischemic cardiomyopathy 10/17/2018  . ICD (implantable cardioverter-defibrillator) in place 10/17/2018  . S/p left hip fracture 10/17/2018  . Traumatic retroperitoneal hematoma 10/17/2018  . Age-related osteoporosis with current pathological fracture with routine healing 07/28/2018  . CAD (coronary artery disease) 07/21/2018  . GERD (gastroesophageal reflux disease) 07/21/2018  . Depression 07/21/2018  .  CKD (chronic kidney disease), stage III 07/21/2018  . Iron deficiency anemia 07/21/2018  . Hypokalemia 07/21/2018  . Closed left hip fracture ([place]) 07/21/2018  . Fracture of femoral neck, left ([place]) 07/21/2018  . Lower extremity pain, bilateral 11/12/2017  . 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 Hyperlipidemia 09/01/2017  . Essential hypertension 09/01/2017  . PAD (peripheral artery disease) ([place]) 09/01/2017  . Anemia, unspecified 08/06/2017  . GAD (generalized anxiety disorder) 06/18/2017  . Prostate cancer screening 04/28/2017  . Hydrocele 04/28/2017  . Bradycardia   . Premature ventricular contraction   . Severe mitral insufficiency   . Cardiomyopathy, ischemic 04/26/2015  . Difficulty hearing 12/29/2014  . Acute on chronic systolic CHF (congestive heart failure) ([place]) 10/10/2014  . S/P CABG  x 5 08/03/2014  . Protein-calorie malnutrition, severe ([place]) 07/29/2014  . AKI (acute kidney injury) ([place]) 07/29/2014   Everlean Alstrom. Graylon Good, PT, DPT 04/18/20, 10:06 AM  [name] [address] [place], [address] Phone: [phone]   Fax:  [phone]  Name: Carl [name] MRN: [id number] Date of Birth: 1951-08-06

## 2020-04-19 ENCOUNTER — Other Ambulatory Visit (HOSPITAL_COMMUNITY): Payer: Self-pay

## 2020-04-19 ENCOUNTER — Encounter (HOSPITAL_COMMUNITY): Payer: Self-pay

## 2020-04-19 NOTE — Progress Notes (Signed)
Today had a home visit with Carl [name].  He is asleep in his chair, had to yell real loud to wake him up.  Unusual for him to be asleep this hard.  Asked if he felt ok, he said feels bad today.  He states back hurts.  Denies chest pain or other pain today.  He states tired today.  Lungs clear.  No edema noted in legs or ankles.  He has his legs propped up.  He states took his medications and ate breakfast this morning.  He has all his medications and filled 2 weeks medications.  His box is missing todays afternoon pills, had to wake Carl [name] again by yelling and he advised put them in his to go box, no pills in it.  Then he shows me a napkin and has his pills from last night and todays evening pills in the napkin rolled up.  He states he forgot to take his night pills and was going to take them today.  Carl [name] he can not take pills he missed yesterday today that it will be too many.  Explained to him again about taking his pills out of his med boxes and putting them elsewhere.  Advised him he needs to take his morning pills with breakfast, afternoon pills with lunch and bedtime when goes to bed.  He states understands but he is very confused today and tired.  Contacted his brother Carl [name] and told him what has happened again. He says he tells Carl [name] all the time not to take them out of his box.  Carl [name] goes off with his sister and takes his medications with him but he is taking them too late in the day, he sits around waiting for her.  I told him to take his afternoon around 12 or 1, he dont have to eat a lot, just a snack or something with them.  He says he understands.  Carl [name] says he has no other pills laying around.  Carl [name] keeps saying he is looking for someone to watch over him and cook for him but has not found someone.  Carl [name] has not weighed today, yesterday weight was 164 lbs.  He was up 3 lbs from last week when I was here.  His torsemide was decreased to 60 mg in morning and 40 mg in evening, his PCP thought he was  too dry.  Carl [name] has appt with Carl [name] tomorrow to redo blood work and check him out.  Discussed with Carl [name] about foods not to eat and drinking too much Pepsi.  He had a bottle of pepsi sitting beside him this morning already.  His sister takes him shopping and she lets him buy whatever he wants.  Have tried talking with her about diet and she continues to take him out to eat and does not watch high sodium foods.  Also have talked with Carl [name] his brother and he picked him up a fried fish plate the other night for supper.  Carl [name] he is feeling bad today most likely from not taking his medications last night.  He denies chest pain, abdominal fullness, headaches or dizziness today. Will watch to see if Carl [name] changes any medications and will continue to visit for heart failure.   Carl [name] [phone]

## 2020-04-20 ENCOUNTER — Ambulatory Visit (HOSPITAL_COMMUNITY)
Admission: RE | Admit: 2020-04-20 | Discharge: 2020-04-20 | Disposition: A | Discharge status: Home / Self Care | Payer: Medicare Other | Source: Ambulatory Visit | Attending: Cardiology | Admitting: Cardiology

## 2020-04-20 ENCOUNTER — Encounter (HOSPITAL_COMMUNITY): Payer: Self-pay | Admitting: Cardiology

## 2020-04-20 ENCOUNTER — Other Ambulatory Visit: Payer: Self-pay

## 2020-04-20 VITALS — BP 112/70 | HR 61 | Wt 165.6 lb

## 2020-04-20 DIAGNOSIS — K7460 Unspecified cirrhosis of liver: Secondary | ICD-10-CM | POA: Insufficient documentation

## 2020-04-20 DIAGNOSIS — Z87891 Personal history of nicotine dependence: Secondary | ICD-10-CM | POA: Insufficient documentation

## 2020-04-20 DIAGNOSIS — I4510 Unspecified right bundle-branch block: Secondary | ICD-10-CM | POA: Diagnosis not present

## 2020-04-20 DIAGNOSIS — I5022 Chronic systolic (congestive) heart failure: Secondary | ICD-10-CM | POA: Insufficient documentation

## 2020-04-20 DIAGNOSIS — I340 Nonrheumatic mitral (valve) insufficiency: Secondary | ICD-10-CM | POA: Diagnosis not present

## 2020-04-20 DIAGNOSIS — N183 Chronic kidney disease, stage 3 unspecified: Secondary | ICD-10-CM | POA: Insufficient documentation

## 2020-04-20 DIAGNOSIS — F70 Mild intellectual disabilities: Secondary | ICD-10-CM | POA: Diagnosis not present

## 2020-04-20 DIAGNOSIS — Z951 Presence of aortocoronary bypass graft: Secondary | ICD-10-CM | POA: Diagnosis not present

## 2020-04-20 DIAGNOSIS — N182 Chronic kidney disease, stage 2 (mild): Secondary | ICD-10-CM | POA: Diagnosis not present

## 2020-04-20 DIAGNOSIS — I480 Paroxysmal atrial fibrillation: Secondary | ICD-10-CM | POA: Insufficient documentation

## 2020-04-20 DIAGNOSIS — D649 Anemia, unspecified: Secondary | ICD-10-CM

## 2020-04-20 DIAGNOSIS — Z8249 Family history of ischemic heart disease and other diseases of the circulatory system: Secondary | ICD-10-CM | POA: Insufficient documentation

## 2020-04-20 DIAGNOSIS — Z79899 Other long term (current) drug therapy: Secondary | ICD-10-CM | POA: Diagnosis not present

## 2020-04-20 DIAGNOSIS — Z7901 Long term (current) use of anticoagulants: Secondary | ICD-10-CM | POA: Insufficient documentation

## 2020-04-20 DIAGNOSIS — I255 Ischemic cardiomyopathy: Secondary | ICD-10-CM | POA: Diagnosis not present

## 2020-04-20 DIAGNOSIS — Z95818 Presence of other cardiac implants and grafts: Secondary | ICD-10-CM | POA: Diagnosis not present

## 2020-04-20 DIAGNOSIS — I252 Old myocardial infarction: Secondary | ICD-10-CM | POA: Diagnosis not present

## 2020-04-20 DIAGNOSIS — F329 Major depressive disorder, single episode, unspecified: Secondary | ICD-10-CM | POA: Diagnosis not present

## 2020-04-20 DIAGNOSIS — I2510 Atherosclerotic heart disease of native coronary artery without angina pectoris: Secondary | ICD-10-CM | POA: Insufficient documentation

## 2020-04-20 LAB — COMPREHENSIVE METABOLIC PANEL
ALT: 26 U/L (ref 0–44)
AST: 31 U/L (ref 15–41)
Albumin: 3.9 g/dL (ref 3.5–5.0)
Alkaline Phosphatase: 125 U/L (ref 38–126)
Anion gap: 12 (ref 5–15)
BUN: 43 mg/dL — ABNORMAL HIGH (ref 8–23)
CO2: 29 mmol/L (ref 22–32)
Calcium: 9.5 mg/dL (ref 8.9–10.3)
Chloride: 88 mmol/L — ABNORMAL LOW (ref 98–111)
Creatinine, Ser: 2.75 mg/dL — ABNORMAL HIGH (ref 0.61–1.24)
GFR calc Af Amer: 26 mL/min — ABNORMAL LOW (ref >60)
GFR calc non Af Amer: 23 mL/min — ABNORMAL LOW (ref >60)
Glucose, Bld: 123 mg/dL — ABNORMAL HIGH (ref 70–99)
Potassium: 3.8 mmol/L (ref 3.5–5.1)
Sodium: 129 mmol/L — ABNORMAL LOW (ref 135–145)
Total Bilirubin: 1.3 mg/dL — ABNORMAL HIGH (ref 0.3–1.2)
Total Protein: 7.5 g/dL (ref 6.5–8.1)

## 2020-04-20 LAB — CBC
HCT: 35.2 % — ABNORMAL LOW (ref 39.0–52.0)
Hemoglobin: 11.4 g/dL — ABNORMAL LOW (ref 13.0–17.0)
MCH: 33.5 pg (ref 26.0–34.0)
MCHC: 32.4 g/dL (ref 30.0–36.0)
MCV: 103.5 fL — ABNORMAL HIGH (ref 80.0–100.0)
Platelets: 233 10*3/uL (ref 150–400)
RBC: 3.4 MIL/uL — ABNORMAL LOW (ref 4.22–5.81)
RDW: 16.9 % — ABNORMAL HIGH (ref 11.5–15.5)
WBC: 4.9 10*3/uL (ref 4.0–10.5)
nRBC: 0 % (ref 0.0–0.2)

## 2020-04-20 LAB — DIGOXIN LEVEL: Digoxin Level: <0.2 ng/mL — ABNORMAL LOW (ref 0.8–2.0)

## 2020-04-20 LAB — TSH: TSH: 2.047 u[IU]/mL (ref 0.350–4.500)

## 2020-04-20 NOTE — Patient Instructions (Signed)
Lab work done today. We will notify you of any abnormal lab work. No news is good news!  EKG done today.   Please call Dr. Keith Rake for an appointment. Their number is [phone].   Please follow up with the [facility] in 1 month.  At the [facility], you and your health needs are our priority. As part of our continuing mission to provide you with exceptional heart care, we have created designated Provider Care Teams. These Care Teams include your primary Cardiologist (physician) and Advanced Practice Providers (APPs- Physician Assistants and Nurse Practitioners) who all work together to provide you with the care you need, when you need it.   You may see any of the following providers on your designated Care Team at your next follow up: Marland Kitchen Dr [name] . Dr [name] . Darrick Grinder, NP . Lyda Jester, PA . Audry Riles, PharmD   Please be sure to bring in all your medications bottles to every appointment.
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
## 2020-04-23 ENCOUNTER — Encounter (HOSPITAL_COMMUNITY): Payer: Self-pay

## 2020-04-23 ENCOUNTER — Other Ambulatory Visit (HOSPITAL_COMMUNITY): Payer: Self-pay

## 2020-04-23 NOTE — Progress Notes (Signed)
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  Changed his torsemide to 40 mg twice daily and potassium to 40 meq daily.  He is not taking losartan, stopped several months ago. Carl [name] is not talking much today, does not appear to be his self.  He will answer questions with short answers.  After leaving contacted his brother Carl [name] and he said Carl [name] is mad at him, he will go by later.  Will continue to visit for heart failure and medication compliance.   [place] [phone]

## 2020-04-24 ENCOUNTER — Other Ambulatory Visit: Payer: Self-pay

## 2020-04-24 ENCOUNTER — Ambulatory Visit: Payer: Medicare Other | Admitting: Physical Therapy

## 2020-04-24 DIAGNOSIS — R42 Dizziness and giddiness: Secondary | ICD-10-CM

## 2020-04-24 DIAGNOSIS — R262 Difficulty in walking, not elsewhere classified: Secondary | ICD-10-CM | POA: Diagnosis not present

## 2020-04-24 DIAGNOSIS — R2681 Unsteadiness on feet: Secondary | ICD-10-CM

## 2020-04-24 NOTE — Therapy (Signed)
[facility] [address], [place], [address] Phone: [phone]   Fax:  [phone]  Physical Therapy Treatment  Carl [name] Details  Name: Carl [name] MRN: [id number] Date of Birth: 02/04/1951 Referring Provider (PT): [name], NP   Encounter Date: 04/24/2020   PT End of Session - 04/24/20 1716    Visit Number 16    Number of Visits 24    Date for PT Re-Evaluation 05/02/20    Authorization Type [employer] Medicare Reporting period from 02/08/2020    Progress Note Due on Visit 20    PT Start Time 1638    PT Stop Time 1716    PT Time Calculation (min) 38 min    Equipment Utilized During Treatment Gait belt    Activity Tolerance Carl [name] tolerated treatment well    Behavior During Therapy [facility] for tasks assessed/performed           Past Medical History:  Diagnosis Date  . AICD (automatic cardioverter/defibrillator) present   . Anemia   . Anxiety   . Arthritis   . Brunner's gland hyperplasia of duodenum   . CHF (congestive heart failure) ([place])   . Coronary artery disease   . External hemorrhoids   . Fracture of femoral neck, left ([place]) 07/21/2018  . GERD (gastroesophageal reflux disease)   . Hearing loss   . HH (hiatus hernia)   . Internal hemorrhoids   . Ischemic cardiomyopathy   . Leg fracture, left   . Myocardial infarction ([place])   . Paronychia   . Pneumonia   . Psoriasis   . PUD (peptic ulcer disease)   . Schatzki's ring   . Tubular adenoma of colon   . Vitiligo     Past Surgical History:  Procedure Laterality Date  . ATRIAL SEPTAL DEFECT(ASD) CLOSURE N/A 12/30/2018   Procedure: ATRIAL SEPTAL DEFECT(ASD) CLOSURE;  Surgeon: [name], MD;  Location: [place] [facility];  Service: Cardiovascular;  Laterality: N/A;  . CARDIAC SURGERY    . CARDIOVERSION N/A 11/09/2019   Procedure: CARDIOVERSION;  Surgeon: [name], MD;  Location: [facility];  Service: Cardiovascular;   Laterality: N/A;  . CARDIOVERSION N/A 12/21/2019   Procedure: CARDIOVERSION;  Surgeon: [name], MD;  Location: [facility] ENDOSCOPY;  Service: Cardiovascular;  Laterality: N/A;  . COLONOSCOPY WITH ESOPHAGOGASTRODUODENOSCOPY (EGD)    . COLONOSCOPY WITH PROPOFOL N/A 08/24/2017   Procedure: COLONOSCOPY WITH PROPOFOL;  Surgeon: [name], MD;  Location: [facility] ENDOSCOPY;  Service: Endoscopy;  Laterality: N/A;  . CORONARY ARTERY BYPASS GRAFT N/A 08/03/2014   Procedure: CORONARY ARTERY BYPASS GRAFTING (CABG);  Surgeon: [name], MD;  Location: [place];  Service: Open Heart Surgery;  Laterality: N/A;  . EP IMPLANTABLE DEVICE N/A 05/01/2015   MDT ICD implanted for primary prevention of sudden death  . ESOPHAGOGASTRODUODENOSCOPY (EGD) WITH PROPOFOL N/A 04/16/2015   Procedure: ESOPHAGOGASTRODUODENOSCOPY (EGD) WITH PROPOFOL;  Surgeon: [name], MD;  Location: [facility] ENDOSCOPY;  Service: Endoscopy;  Laterality: N/A;  . ESOPHAGOGASTRODUODENOSCOPY (EGD) WITH PROPOFOL N/A 08/24/2017   Procedure: ESOPHAGOGASTRODUODENOSCOPY (EGD) WITH PROPOFOL;  Surgeon: [name], MD;  Location: [facility] ENDOSCOPY;  Service: Endoscopy;  Laterality: N/A;  . FRACTURE SURGERY    . HERNIA REPAIR    . HIP PINNING,CANNULATED Left 07/22/2018   Procedure: CANNULATED HIP PINNING;  Surgeon: [name], MD;  Location: [place];  Service: Orthopedics;  Laterality: Left;  . MITRAL VALVE REPAIR N/A 12/30/2018   Procedure:  MITRAL VALVE REPAIR;  Surgeon: [name], MD;  Location: [place] [facility];  Service: Cardiovascular;  Laterality: N/A;  . RIGHT/LEFT HEART CATH AND CORONARY/GRAFT ANGIOGRAPHY N/A 12/21/2018   Procedure: RIGHT/LEFT HEART CATH AND CORONARY/GRAFT ANGIOGRAPHY;  Surgeon: [name], MD;  Location: [place] [facility];  Service: Cardiovascular;  Laterality: N/A;  . TEE WITHOUT CARDIOVERSION N/A 08/03/2014   Procedure: TRANSESOPHAGEAL ECHOCARDIOGRAM (TEE);  Surgeon: [name], MD;  Location: [facility];  Service: Open Heart Surgery;  Laterality: N/A;  . TEE WITHOUT CARDIOVERSION N/A 02/10/2018   Procedure: TRANSESOPHAGEAL ECHOCARDIOGRAM (TEE);  Surgeon: [name], MD;  Location: [facility] ENDOSCOPY;  Service: Cardiovascular;  Laterality: N/A;  . TEE WITHOUT CARDIOVERSION N/A 11/09/2019   Procedure: TRANSESOPHAGEAL ECHOCARDIOGRAM (TEE);  Surgeon: [name], MD;  Location: [facility] ENDOSCOPY;  Service: Cardiovascular;  Laterality: N/A;    There were no vitals filed for this visit.   Subjective Assessment - 04/24/20 1727    Subjective Carl [name] 22 min late stating he fell asleep. Reports he has been having some heel pain when he puts his legs up. Also brought in toe spacer for hallux valgus and asked how to use it. States his toe continues to bother him. States he remembers no falls since last time. Thinks he fell against the wall and think he messed his back up. Upon further discussion he thinks this happened prior to starting PT and he is not having any back pain. Reports no pain at the moment.    Carl [name] is accompained by: Family member    Pertinent History Carl [name] is a 69 y.o. male who presents to outpatient physical therapy with a referral for medical diagnosis unsteadiness on feet, dizziness and giddiness. This Carl [name]'s chief complaints consist of feeling unsteady on feet and occasional falling leading to the following functional deficits: difficulty with basic mobility including household and community ambulation, frequent injuries and pain from falling, completing ADLs, IADLs, safety and quality of life. Relevant past medical history and comorbidities are extensive (see above) with particular relevance of cognitive impairment/decline, repeated L femur fracture, presence of AICD, low blood pressure, former smoker, MI with 5x bipass, ischemic cardiomyopathy, CHF, chronic Afib with recent need for cardioversion, neck pain, leg pain, DVT in left arm (recently cleared), cellulitis of L LE (recently  treated).  Carl [name] denies hx of cancer, stroke, seizures, lung problem, diabetes, unexplained weight loss.    Limitations Lifting;Standing;Walking;House hold activities    Diagnostic tests US venous Img Upper Uni Lft report 11/20/2019: "IMPRESSION:Nonocclusive thrombus within the left brachial vein near theantecubital fossa" resolved per note on 5/27/2021CT Lumbar spine report 06/19/2020:"IMPRESSION:1. Healing right sacral ala fracture. This likely contributes to thepatient's right-sided hip pain.2. Dynamic listhesis with mild subarticular and foraminal stenosisbilaterally at L4-5. The stenosis is likely worse with standing andflexion.3. Mild right foraminal narrowing at L2-3.4. Broad-based disc protrusion at L2-3 and L3-4 extends into theforamina bilaterally both levels without other significant stenosis.5. Moderate left and mild right foraminal stenosis at L5-S1secondary to chronic broad-based disc protrusion and facethypertrophy. Mild left subarticular narrowing is present as well.6.  Aortic Atherosclerosis (ICD10-I70.0)."    Currently in Pain? No/denies           TREATMENT: Recent DVT in left elbow region High fall risk! Cognitive deficits  Neuromuscular Re-education:to improve, balance, postural strength, muscle activation patterns, and stabilization strength required for functional activities, SBA - min A provided for all standing tasks for safey: - step up/down to 4 inch step x20 each leg leading, CGA-min  A - side stepping back and forth over 6 inch hurdle x 20 complete reps.  - forwards and backwards stepping over 6 inch hurdle, x 20 times leading with each leg.   Tandem walking forward and backwards 5x5 feet each way with L UE support on TM bar.CGA - Min A to prevent fall  Ambulation backwards with SPC 4x30 feet with CGA-minA for safety.  Ambulation with SPC around clinic around 3 cones set up in corner fashion and weaving around 4 cones using SPC with CGA for safety.   Pt  required multimodal cuing for proper technique and to facilitate improved neuromuscular control, strength, range of motion, and functional ability resulting in improved performance and form.     PT Education - 04/24/20 1729    Education Details exercise form, posture.    Person(s) Educated Carl [name]    Methods Explanation;Demonstration;Tactile cues;Verbal cues    Comprehension Verbalized understanding;Returned demonstration;Verbal cues required;Tactile cues required;Need further instruction            PT Short Term Goals - 03/14/20 1106      PT SHORT TERM GOAL #1   Title Carl [name]/family indepndent with initial home exercise program for self-management of symptoms.    Baseline to be established as safe and appropriate visit 2 (02/08/2020); Carl [name] requires step by step cuing to perform exercises safey with adequate guarding; family does not attend sessions, will continue to assess for ability to safely perform HEP (03/14/2020);    Time 2    Period Weeks    Status On-going    Target Date 02/23/20             PT Long Term Goals - 03/14/20 1107      PT LONG TERM GOAL #1   Title Family/Carl [name] is independent with a long-term home exercise program for self-management of symptoms.    Baseline to be established as safe and appropriate visit 2 (02/08/2020); Carl [name] requires step by step cuing to perform exercises safey with adequate guarding; family does not attend sessions, will continue to assess for ability to safely perform HEP (03/14/2020);    Time 12    Period Weeks    Status On-going   TARGET DATE FOR ALL LONG TERM GOALS: 05/02/2020     PT LONG TERM GOAL #2   Title Carl [name] will score equal or more than 25/30 on Functional Gait Assessment to demonstrate low fall risk.    Baseline to be tested visit 2 (02/08/2020); 14/30 (02/13/2020); 12/30 (03/14/2020);    Time 12    Period Weeks    Status On-going      PT LONG TERM GOAL #3   Title Carl [name] will demonstrate the ability to complete  sequential tapping steps on 8 inch step independently to demonstrate improved ability to navigate around objects without falling.    Baseline required support and repeated instructions (02/08/2020); able to complete with CGA and no loss of balance (03/14/2020):    Time 12    Period Weeks    Status Partially Met      PT LONG TERM GOAL #4   Title Carl [name] will demonstrated ability to complete Five Time Sit to Stand Test in equal or less than 12 seconds from 18.5 inch surface without UE support to demonstrate improved balance and safety with transfers.    Baseline 17 seconds from 18.5 inch plinth, initial hand use. . (02/08/2020); 15 seconds from 18.5 inch plinth,No UE use. (03/14/2020);    Time 12    Period Weeks  Status Partially Met      PT LONG TERM GOAL #5   Title Carl [name] will report less than 1 fall over 8 weeks to demonstrate improved safety with functional mobility.    Baseline at least 5 falls in the last 6 months (02/08/2020); reports no falls over the last 5 weeks (03/14/2020);    Time 12    Period Weeks    Status Partially Met                 Plan - 04/24/20 1727    Clinical Impression Statement Carl [name] 22 min late today but was able to extend session due to schedule availability. Carl [name] tolerated treatment well overall and had no complaint of pain during or after exercises. Did not show him how to use toe spacer due to concerns that podiatrist did not recommend it at the last session and may cause abnormal pressure against shoe at medial great toe. Recommended Carl [name] consult with podiatrist about use of the spacer. Did not speak to sister today when she dropped Carl [name] off and picked him up. Carl [name] continues to require max cuing to complete tasks due to hard of hearing and cognitive decline. Requires CGA - min A to prevent falls when he loses his balance. Carl [name] would benefit from continued management of limiting condition by skilled physical therapist to address remaining  impairments and functional limitations to work towards stated goals and return to PLOF or maximal functional independence.    Personal Factors and Comorbidities Age;Time since onset of injury/illness/exacerbation;Behavior Pattern;Comorbidity 3+;Education;Social Background;Past/Current Experience;Fitness;Other    Comorbidities Relevant past medical history and comorbidities are extensive (see above) with particular relevance of cognitive impairment/decline, repeated L femur fracture, presence of AICD, low blood pressure, former smoker, MI with 5x bipass, ischemic cardiomyopathy, CHF, chronic Afib with recent need for cardioversion, neck pain, leg pain, DVT in left arm (recently cleared), cellulitis of L LE (recently treated).    Examination-Activity Limitations Carry;Stairs;Sit;Reach Overhead;Locomotion Level;Stand;Lift;Dressing;Bend;Transfers;Other    Examination-Participation Restrictions Community Activity;Cleaning;Meal Prep;Medication Management;Driving;Shop;Yard Work    [employer] Evolving/Moderate complexity    Rehab Potential Fair    PT Frequency 2x / week    PT Duration 12 weeks    PT Treatment/Interventions ADLs/Self Care Home Management;Cryotherapy;Canalith Repostioning;Moist Heat;Gait training;Stair training;Functional mobility training;Therapeutic activities;Therapeutic exercise;Balance training;Neuromuscular re-education;Cognitive remediation;Carl [name]/family education;Manual techniques;Dry needling;Vestibular;Visual/perceptual remediation/compensation;Joint Manipulations;Spinal Manipulations    PT Next Visit Plan progressive balance training, HEP as safe    PT Home Exercise Plan to be established as appropriate based on cognition and safety    Consulted and Agree with Plan of Care Carl [name]           Carl [name] will benefit from skilled therapeutic intervention in order to improve the following deficits and impairments:  Abnormal gait, Decreased cognition, Decreased  knowledge of use of DME, Dizziness, Improper body mechanics, Pain, Impaired tone, Decreased coordination, Decreased mobility, Decreased activity tolerance, Decreased endurance, Difficulty walking, Decreased safety awareness, Decreased knowledge of precautions, Decreased balance  Visit Diagnosis: Difficulty in walking, not elsewhere classified  Unsteadiness on feet  Dizziness and giddiness     Problem List Carl [name] Active Problem List   Diagnosis Date Noted  . Persistent atrial fibrillation ([place]) 11/30/2019  . Secondary hypercoagulable state ([place]) 11/30/2019  . Arm DVT (deep venous thromboembolism), acute, left ([place]) 11/26/2019  . Hx of adenomatous colonic polyps 05/18/2019  . Cirrhosis, non-alcoholic ([place]) 24/82/5003  . Occult blood positive stool 05/12/2019  . HCAP (healthcare-associated pneumonia) 03/04/2019  . Hallucinations 02/10/2019  . Elevated blood sugar  01/27/2019  . S/P mitral valve repair 12/31/2018  . Bilateral pulmonary contusion 10/17/2018  . Cardiac LV ejection fraction of 20-34% 10/17/2018  . Chronic systolic CHF (congestive heart failure) ([place]) 10/17/2018  . History of ischemic cardiomyopathy 10/17/2018  . ICD (implantable cardioverter-defibrillator) in place 10/17/2018  . S/p left hip fracture 10/17/2018  . Traumatic retroperitoneal hematoma 10/17/2018  . Age-related osteoporosis with current pathological fracture with routine healing 07/28/2018  . CAD (coronary artery disease) 07/21/2018  . GERD (gastroesophageal reflux disease) 07/21/2018  . Depression 07/21/2018  . CKD (chronic kidney disease), stage III 07/21/2018  . Iron deficiency anemia 07/21/2018  . Hypokalemia 07/21/2018  . Closed left hip fracture ([place]) 07/21/2018  . Fracture of femoral neck, left ([place]) 07/21/2018  . Lower extremity pain, bilateral 11/12/2017  . 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 Hyperlipidemia 09/01/2017  . Essential hypertension 09/01/2017  . PAD  (peripheral artery disease) ([place]) 09/01/2017  . Anemia, unspecified 08/06/2017  . GAD (generalized anxiety disorder) 06/18/2017  . Prostate cancer screening 04/28/2017  . Hydrocele 04/28/2017  . Bradycardia   . Premature ventricular contraction   . Severe mitral insufficiency   . Cardiomyopathy, ischemic 04/26/2015  . Difficulty hearing 12/29/2014  . Acute on chronic systolic CHF (congestive heart failure) ([place]) 10/10/2014  . 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 Protein-calorie malnutrition, severe ([place]) 07/29/2014  . AKI (acute kidney injury) ([place]) 07/29/2014    Everlean Alstrom. Graylon Good, PT, DPT 04/24/20, 5:30 PM  [facility] [address], [place], [address] Phone: [phone]   Fax:  [phone]  Name: ALISTAIR SENFT MRN: [id number] Date of Birth: May 31, 1951

## 2020-04-25 ENCOUNTER — Telehealth (HOSPITAL_COMMUNITY): Payer: Self-pay

## 2020-04-25 ENCOUNTER — Encounter (HOSPITAL_COMMUNITY): Payer: Medicare Other | Admitting: Cardiology

## 2020-04-25 MED ORDER — TORSEMIDE 20 MG PO TABS
40.0000 mg | ORAL_TABLET | Freq: Two times a day (BID) | ORAL | 0 refills | Status: DC
Start: 2020-04-25 — End: 2020-05-21

## 2020-04-25 MED ORDER — POTASSIUM CHLORIDE CRYS ER 20 MEQ PO TBCR: 40.0000 meq | EXTENDED_RELEASE_TABLET | Freq: Every day | ORAL | 3 refills | Status: DC

## 2020-04-25 NOTE — Telephone Encounter (Signed)
-----   Message from [name], MD sent at 04/20/2020  2:44 PM EDT ----- High creatinine.  Decrease torsemide to 40 mg bid, stop losartan, decrease KCl to 40 daily.  BMET 1 week.

## 2020-04-25 NOTE — Telephone Encounter (Signed)
Carl [name] has updated patients medications, pts med list updated in the system,lab order entered.   Meds ordered this encounter  Medications  . torsemide (DEMADEX) 20 MG tablet    Sig: Take 2 tablets (40 mg total) by mouth 2 (two) times daily.    Dispense:  540 tablet    Refill:  0    Please cancel all previous orders for current medication. Change in dosage or pill size.  . potassium chloride SA (KLOR-CON M20) 20 MEQ tablet    Sig: Take 2 tablets (40 mEq total) by mouth daily. Take an extra 22meq every Wednesday AM with Metolazone.    Dispense:  100 tablet    Refill:  3    Please cancel all previous orders for current medication. Change in dosage or pill size.

## 2020-04-26 ENCOUNTER — Encounter: Payer: Self-pay | Admitting: Physical Therapy

## 2020-04-26 ENCOUNTER — Ambulatory Visit (INDEPENDENT_AMBULATORY_CARE_PROVIDER_SITE_OTHER): Payer: Medicare Other | Admitting: *Deleted

## 2020-04-26 ENCOUNTER — Telehealth: Payer: Self-pay | Admitting: Physical Therapy

## 2020-04-26 ENCOUNTER — Ambulatory Visit: Payer: Medicare Other | Admitting: Physical Therapy

## 2020-04-26 ENCOUNTER — Ambulatory Visit: Payer: Medicare Other | Attending: Family Medicine | Admitting: Physical Therapy

## 2020-04-26 ENCOUNTER — Ambulatory Visit (HOSPITAL_COMMUNITY)
Admission: RE | Admit: 2020-04-26 | Discharge: 2020-04-26 | Disposition: A | Discharge status: Home / Self Care | Payer: Medicare Other | Source: Ambulatory Visit | Attending: Cardiology | Admitting: Cardiology

## 2020-04-26 ENCOUNTER — Other Ambulatory Visit: Payer: Self-pay

## 2020-04-26 VITALS — BP 128/78 | HR 56 | Temp 97.4°F

## 2020-04-26 DIAGNOSIS — R262 Difficulty in walking, not elsewhere classified: Secondary | ICD-10-CM | POA: Insufficient documentation

## 2020-04-26 DIAGNOSIS — R42 Dizziness and giddiness: Secondary | ICD-10-CM | POA: Diagnosis present

## 2020-04-26 DIAGNOSIS — I5022 Chronic systolic (congestive) heart failure: Secondary | ICD-10-CM | POA: Insufficient documentation

## 2020-04-26 DIAGNOSIS — R2681 Unsteadiness on feet: Secondary | ICD-10-CM | POA: Insufficient documentation

## 2020-04-26 DIAGNOSIS — I255 Ischemic cardiomyopathy: Secondary | ICD-10-CM | POA: Diagnosis not present

## 2020-04-26 LAB — BASIC METABOLIC PANEL
Anion gap: 11 (ref 5–15)
BUN: 51 mg/dL — ABNORMAL HIGH (ref 8–23)
CO2: 27 mmol/L (ref 22–32)
Calcium: 9.7 mg/dL (ref 8.9–10.3)
Chloride: 92 mmol/L — ABNORMAL LOW (ref 98–111)
Creatinine, Ser: 2.6 mg/dL — ABNORMAL HIGH (ref 0.61–1.24)
GFR calc Af Amer: 28 mL/min — ABNORMAL LOW (ref >60)
GFR calc non Af Amer: 24 mL/min — ABNORMAL LOW (ref >60)
Glucose, Bld: 138 mg/dL — ABNORMAL HIGH (ref 70–99)
Potassium: 4 mmol/L (ref 3.5–5.1)
Sodium: 130 mmol/L — ABNORMAL LOW (ref 135–145)

## 2020-04-26 NOTE — Therapy (Signed)
[facility] [address], [place], [address] Phone: [phone]   Fax:  [phone]  Physical Therapy Treatment  Patient Details  Name: Carl [name] MRN: [id number] Date of Birth: 11-20-1950 Referring Provider (PT): [name], NP   Encounter Date: 04/26/2020   PT End of Session - 04/26/20 1050    Visit Number 17    Number of Visits 24    Date for PT Re-Evaluation 05/02/20    Authorization Type [employer] Medicare Reporting period from 02/08/2020    Progress Note Due on Visit 20    PT Start Time 0958    PT Stop Time 1022    PT Time Calculation (min) 24 min    Equipment Utilized During Treatment Gait belt    Activity Tolerance Patient limited by lethargy;Patient limited by fatigue;Other (comment)   complaint of diaphoresis   Behavior During Therapy Flat affect;Impulsive           Past Medical History:  Diagnosis Date  . AICD (automatic cardioverter/defibrillator) present   . Anemia   . Anxiety   . Arthritis   . Brunner's gland hyperplasia of duodenum   . CHF (congestive heart failure) ([place])   . Coronary artery disease   . External hemorrhoids   . Fracture of femoral neck, left ([place]) 07/21/2018  . GERD (gastroesophageal reflux disease)   . Hearing loss   . HH (hiatus hernia)   . Internal hemorrhoids   . Ischemic cardiomyopathy   . Leg fracture, left   . Myocardial infarction ([place])   . Paronychia   . Pneumonia   . Psoriasis   . PUD (peptic ulcer disease)   . Schatzki's ring   . Tubular adenoma of colon   . Vitiligo     Past Surgical History:  Procedure Laterality Date  . ATRIAL SEPTAL DEFECT(ASD) CLOSURE N/A 12/30/2018   Procedure: ATRIAL SEPTAL DEFECT(ASD) CLOSURE;  Surgeon: Carl Mocha, MD;  Location: [place] [facility];  Service: Cardiovascular;  Laterality: N/A;  . CARDIAC SURGERY    . CARDIOVERSION N/A 11/09/2019   Procedure: CARDIOVERSION;  Surgeon: Carl Dresser, MD;   Location: [facility] ENDOSCOPY;  Service: Cardiovascular;  Laterality: N/A;  . CARDIOVERSION N/A 12/21/2019   Procedure: CARDIOVERSION;  Surgeon: Carl Dresser, MD;  Location: [facility] ENDOSCOPY;  Service: Cardiovascular;  Laterality: N/A;  . COLONOSCOPY WITH ESOPHAGOGASTRODUODENOSCOPY (EGD)    . COLONOSCOPY WITH PROPOFOL N/A 08/24/2017   Procedure: COLONOSCOPY WITH PROPOFOL;  Surgeon: Carl Silvas, MD;  Location: [facility] ENDOSCOPY;  Service: Endoscopy;  Laterality: N/A;  . CORONARY ARTERY BYPASS GRAFT N/A 08/03/2014   Procedure: CORONARY ARTERY BYPASS GRAFTING (CABG);  Surgeon: Carl Pollack, MD;  Location: [place];  Service: Open Heart Surgery;  Laterality: N/A;  . EP IMPLANTABLE DEVICE N/A 05/01/2015   MDT ICD implanted for primary prevention of sudden death  . ESOPHAGOGASTRODUODENOSCOPY (EGD) WITH PROPOFOL N/A 04/16/2015   Procedure: ESOPHAGOGASTRODUODENOSCOPY (EGD) WITH PROPOFOL;  Surgeon: Carl Class, MD;  Location: [facility] ENDOSCOPY;  Service: Endoscopy;  Laterality: N/A;  . ESOPHAGOGASTRODUODENOSCOPY (EGD) WITH PROPOFOL N/A 08/24/2017   Procedure: ESOPHAGOGASTRODUODENOSCOPY (EGD) WITH PROPOFOL;  Surgeon: Carl Silvas, MD;  Location: [facility] ENDOSCOPY;  Service: Endoscopy;  Laterality: N/A;  . FRACTURE SURGERY    . HERNIA REPAIR    . HIP PINNING,CANNULATED Left 07/22/2018   Procedure: CANNULATED HIP PINNING;  Surgeon: Carl Bond, MD;  Location: [place];  Service: Orthopedics;  Laterality: Left;  . MITRAL VALVE  REPAIR N/A 12/30/2018   Procedure: MITRAL VALVE REPAIR;  Surgeon: Carl Mocha, MD;  Location: [place] [facility];  Service: Cardiovascular;  Laterality: N/A;  . RIGHT/LEFT HEART CATH AND CORONARY/GRAFT ANGIOGRAPHY N/A 12/21/2018   Procedure: RIGHT/LEFT HEART CATH AND CORONARY/GRAFT ANGIOGRAPHY;  Surgeon: Carl Mocha, MD;  Location: [place] [facility];  Service: Cardiovascular;  Laterality: N/A;  . TEE WITHOUT CARDIOVERSION N/A 08/03/2014   Procedure: TRANSESOPHAGEAL ECHOCARDIOGRAM  (TEE);  Surgeon: Carl Pollack, MD;  Location: [place];  Service: Open Heart Surgery;  Laterality: N/A;  . TEE WITHOUT CARDIOVERSION N/A 02/10/2018   Procedure: TRANSESOPHAGEAL ECHOCARDIOGRAM (TEE);  Surgeon: Carl Dresser, MD;  Location: [facility] ENDOSCOPY;  Service: Cardiovascular;  Laterality: N/A;  . TEE WITHOUT CARDIOVERSION N/A 11/09/2019   Procedure: TRANSESOPHAGEAL ECHOCARDIOGRAM (TEE);  Surgeon: Carl Dresser, MD;  Location: [facility] ENDOSCOPY;  Service: Cardiovascular;  Laterality: N/A;    Vitals:   04/26/20 1000  BP: 128/78  Pulse: (!) 56  Temp: (!) 97.4 F (36.3 C)  SpO2: 97%     Subjective Assessment - 04/26/20 1000    Subjective Patient is 13 min late. Actually called ahead and said he did not have a ride, then showed up dropped off by sister. States he has some pain at bilateral lower anterior legs but is unable to provide numeric rating. States he felt okay following last session. No falls since last session. Brought a water bottle and asked to put it in refrigerator.    Patient is accompained by: Family member    Pertinent History Patient is a 69 y.o. male who presents to outpatient physical therapy with a referral for medical diagnosis unsteadiness on feet, dizziness and giddiness. This patient's chief complaints consist of feeling unsteady on feet and occasional falling leading to the following functional deficits: difficulty with basic mobility including household and community ambulation, frequent injuries and pain from falling, completing ADLs, IADLs, safety and quality of life. Relevant past medical history and comorbidities are extensive (see above) with particular relevance of cognitive impairment/decline, repeated L femur fracture, presence of AICD, low blood pressure, former smoker, MI with 5x bipass, ischemic cardiomyopathy, CHF, chronic Afib with recent need for cardioversion, neck pain, leg pain, DVT in left arm (recently cleared), cellulitis of L LE (recently treated).   Patient denies hx of cancer, stroke, seizures, lung problem, diabetes, unexplained weight loss.    Limitations Lifting;Standing;Walking;House hold activities    Diagnostic tests US venous Img Upper Uni Lft report 11/20/2019: "IMPRESSION:Nonocclusive thrombus within the left brachial vein near theantecubital fossa" resolved per note on 5/27/2021CT Lumbar spine report 06/19/2020:"IMPRESSION:1. Healing right sacral ala fracture. This likely contributes to thepatient's right-sided hip pain.2. Dynamic listhesis with mild subarticular and foraminal stenosisbilaterally at L4-5. The stenosis is likely worse with standing andflexion.3. Mild right foraminal narrowing at L2-3.4. Broad-based disc protrusion at L2-3 and L3-4 extends into theforamina bilaterally both levels without other significant stenosis.5. Moderate left and mild right foraminal stenosis at L5-S1secondary to chronic broad-based disc protrusion and facethypertrophy. Mild left subarticular narrowing is present as well.6.  Aortic Atherosclerosis (ICD10-I70.0)."    Currently in Pain? Yes    Pain Score --   unable to give rating   Pain Location Leg    Pain Orientation Right;Left;Lower;Anterior             [facility] PT Assessment - 04/26/20 0001      Assessment   Medical Diagnosis unsteadiness on feet, dizziness and giddiness    Referring Provider (PT) Fields, Colan Neptune,  NP    Onset Date/Surgical Date --   unclear   Hand Dominance Left    Next MD Visit not sure which day    Prior Therapy two visits at main hospital      Precautions   Precautions Fall;ICD/Pacemaker   no salt     Restrictions   Weight Bearing Restrictions No      [place] residence    Living Arrangements Alone    Available Help at Discharge Family;Friend(s);Other (Comment)   has help with yard, cleaning home, medications, meals   Type of [place] to enter    Entrance Stairs-Number of Steps 3    Entrance  Stairs-Rails Right;Left;Can reach both    [place] level    [place] - single point;Walker - 2 wheels;Other (comment);Shower seat;Grab bars - tub/shower      Prior Function   Level of Independence Needs assistance with homemaking;Needs assistance with ADLs;Requires assistive device for independence;Independent with basic ADLs;Independent with household mobility with device;Other (comment)   used to be able to be completely independent "quite a while    Vocation Retired;On disability    Vocation Requirements used to work at [employer], 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     Leisure used to like to walk his dog      Cognition   Overall Cognitive Status History of cognitive impairments - at baseline    Memory Impaired    Executive Function --   impaired     Observation/Other Assessments   Focus on Therapeutic Outcomes (FOTO)  not appropriate due to cognitive deficits      Functional Gait  Assessment   Gait assessed  --   used SPC   Gait Level Surface Walks 20 ft, slow speed, abnormal gait pattern, evidence for imbalance or deviates 10-15 in outside of the 12 in walkway width. Requires more than 7 sec to ambulate 20 ft.   7 seconds   Change in Gait Speed Makes only minor adjustments to walking speed, or accomplishes a change in speed with significant gait deviations, deviates 10-15 in outside the 12 in walkway width, or changes speed but loses balance but is able to recover and continue walking.   comes to complete stop before changing speed   Gait with Horizontal Head Turns --    Gait with Vertical Head Turns --    Gait and Pivot Turn --    Step Over Obstacle --    Gait with Narrow Base of Support --    Gait with Eyes Closed --    Ambulating Backwards --    Steps --    Total Score --    FGA comment: < 19 = high risk fall (last score 12/30 on 03/14/2020)   Unable to follow directions to complete test            OBJECTIVE  OBSERVATION/INSPECTION Posture:upright to increasingly stooped in standing and sitting  Bed mobility:not tested.  Transfers:sit <> stand SBA for safety, very slow and with heavy UE support on mat/chair.  Gait: grossly WFL for household and short community ambulationwith single point cane except significant unsteadiness noted at timesand high level of stumbling, etc when not using AD.Increasingly slow and stooped today.   FUNCTIONAL/BALANCE TESTS: Functional Gait Assessment: unable to complete second to difficulty following commands (also had no hearing aides).  Five Time Sit to Stand (5TSTS):unable  to complete second to feeling poorly and difficulty following commands.    EDUCATION/COGNITION: Patient is intermimttantly drowsy and confused (has history of dementia), is unable to provide accurate history and asks questions inappropriate to the situation frequently.    Objective measurements completed on examination: See above findings.    TREATMENT: Recent DVT in left elbow region High fall risk! Cognitive deficits  Neuromuscular Re-education:to improve, balance, postural strength, muscle activation patterns, and stabilization strength required for functional activities, SBA - min A provided for all standing tasks for safey: - attempted FGA (see above). Patient unable to follow directions adequately to complete second to hard of hearing without hearing aides and confusion.  - attempted 5 Time Sit to Stand but patient struggling with getting in and out of chair and complains of feeling sweaty. No visible sweat at first but palpation revealed pt is diaphoretic.  - vitals check due for safety and appropriateness of continuing: all vitals WFL including temperature (not too high).  - called sister, [name], to discuss patient's limitations today, discontinued treatment session, and accompanied pt to waiting room.    Pt required multimodal cuing for proper  technique and to facilitate improved neuromuscular control, strength, range of motion, and functional ability resulting in improved performance and form.     PT Education - 04/26/20 1049    Education Details exercise form, posture, importance of follow up with cardiologist, concerns about patient's ability to communicate and discontinuing PT due to not seeming well.    Person(s) Educated Theatre stage manager);Patient;Child(ren)    Methods Explanation;Demonstration;Tactile cues;Verbal cues    Comprehension Verbalized understanding;Verbal cues required;Tactile cues required;Need further instruction            PT Short Term Goals - 03/14/20 1106      PT SHORT TERM GOAL #1   Title Patient/family indepndent with initial home exercise program for self-management of symptoms.    Baseline to be established as safe and appropriate visit 2 (02/08/2020); Patient requires step by step cuing to perform exercises safey with adequate guarding; family does not attend sessions, will continue to assess for ability to safely perform HEP (03/14/2020);    Time 2    Period Weeks    Status On-going    Target Date 02/23/20             PT Long Term Goals - 03/14/20 1107      PT LONG TERM GOAL #1   Title Family/patient is independent with a long-term home exercise program for self-management of symptoms.    Baseline to be established as safe and appropriate visit 2 (02/08/2020); Patient requires step by step cuing to perform exercises safey with adequate guarding; family does not attend sessions, will continue to assess for ability to safely perform HEP (03/14/2020);    Time 12    Period Weeks    Status On-going   TARGET DATE FOR ALL LONG TERM GOALS: 05/02/2020     PT LONG TERM GOAL #2   Title Patient will score equal or more than 25/30 on Functional Gait Assessment to demonstrate low fall risk.    Baseline to be tested visit 2 (02/08/2020); 14/30 (02/13/2020); 12/30 (03/14/2020);    Time 12    Period Weeks     Status On-going      PT LONG TERM GOAL #3   Title Patient will demonstrate the ability to complete sequential tapping steps on 8 inch step independently to demonstrate improved ability to navigate around objects without falling.    Baseline required  support and repeated instructions (02/08/2020); able to complete with CGA and no loss of balance (03/14/2020):    Time 12    Period Weeks    Status Partially Met      PT LONG TERM GOAL #4   Title Patient will demonstrated ability to complete Five Time Sit to Stand Test in equal or less than 12 seconds from 18.5 inch surface without UE support to demonstrate improved balance and safety with transfers.    Baseline 17 seconds from 18.5 inch plinth, initial hand use. . (02/08/2020); 15 seconds from 18.5 inch plinth,No UE use. (03/14/2020);    Time 12    Period Weeks    Status Partially Met      PT LONG TERM GOAL #5   Title Patient will report less than 1 fall over 8 weeks to demonstrate improved safety with functional mobility.    Baseline at least 5 falls in the last 6 months (02/08/2020); reports no falls over the last 5 weeks (03/14/2020);    Time 12    Period Weeks    Status Partially Met                 Plan - 04/26/20 1048    Clinical Impression Statement Patient arrived 13 min late with seemingly usual level of alertness but having difficulty communicating with PT due to not having his hearing aides. Attempted to perform functional testing to complete formal progress assessment due to upcoming end to cert period and general lack of obvious progress in the last few sessions. Patient with great difficulty following directions for FGA and it became apparent he was unable to follow directions well enough to complete the assessment. Patient then complained of feeling sweaty but no visible sweat on forehead. Attempted 5TSTS test but patient again having difficulty following directions, having a hard time with simple sit <> stand transfer and  continued to complain about sweating and seemingly more drowsy and not himself. Checked vitals which were within functional limits but patient mildly diaphoretic to touch. Could be from ambient temperature in room and recent exertion to attempt FGA, but discontinued PT session at this point due to limited ability to participation and patient apparently not feeling well. Patient did not appear to be worsening at rest. Called and discussed events of the session with sister, [name], who dropped him off today. Confirmed with her that he is to see his cardiologist later today. She shared he has started a new medication and she is not sure if he is taking it correctly. Advised her on his normal vitals and strong recommendation to keep his cardiologist appointment today. Patient ambulated out to car with [facility] and sister at his side. Patient would benefit from continued management of limiting condition by skilled physical therapist to address remaining impairments and functional limitations to work towards stated goals and return to PLOF or maximal functional independence.    Personal Factors and Comorbidities Age;Time since onset of injury/illness/exacerbation;Behavior Pattern;Comorbidity 3+;Education;Social Background;Past/Current Experience;Fitness;Other    Comorbidities Relevant past medical history and comorbidities are extensive (see above) with particular relevance of cognitive impairment/decline, repeated L femur fracture, presence of AICD, low blood pressure, former smoker, MI with 5x bipass, ischemic cardiomyopathy, CHF, chronic Afib with recent need for cardioversion, neck pain, leg pain, DVT in left arm (recently cleared), cellulitis of L LE (recently treated).    Examination-Activity Limitations Carry;Stairs;Sit;Reach Overhead;Locomotion Level;Stand;Lift;Dressing;Bend;Transfers;Other    Examination-Participation Restrictions Community Activity;Cleaning;Meal Prep;Medication Management;Driving;Shop;Yard Work     Merchant navy officer Evolving/Moderate  complexity    Rehab Potential Fair    PT Frequency 2x / week    PT Duration 12 weeks    PT Treatment/Interventions ADLs/Self Care Home Management;Cryotherapy;Canalith Repostioning;Moist Heat;Gait training;Stair training;Functional mobility training;Therapeutic activities;Therapeutic exercise;Balance training;Neuromuscular re-education;Cognitive remediation;Patient/family education;Manual techniques;Dry needling;Vestibular;Visual/perceptual remediation/compensation;Joint Manipulations;Spinal Manipulations    PT Next Visit Plan progressive balance training, HEP as safe    PT Home Exercise Plan to be established as appropriate based on cognition and safety    Consulted and Agree with Plan of Care Patient           Patient will benefit from skilled therapeutic intervention in order to improve the following deficits and impairments:  Abnormal gait, Decreased cognition, Decreased knowledge of use of DME, Dizziness, Improper body mechanics, Pain, Impaired tone, Decreased coordination, Decreased mobility, Decreased activity tolerance, Decreased endurance, Difficulty walking, Decreased safety awareness, Decreased knowledge of precautions, Decreased balance  Visit Diagnosis: Difficulty in walking, not elsewhere classified  Unsteadiness on feet  Dizziness and giddiness     Problem List Patient Active Problem List   Diagnosis Date Noted  . Persistent atrial fibrillation ([place]) 11/30/2019  . Secondary hypercoagulable state ([place]) 11/30/2019  . Arm DVT (deep venous thromboembolism), acute, left ([place]) 11/26/2019  . Hx of adenomatous colonic polyps 05/18/2019  . Cirrhosis, non-alcoholic ([place]) 96/75/9163  . Occult blood positive stool 05/12/2019  . HCAP (healthcare-associated pneumonia) 03/04/2019  . Hallucinations 02/10/2019  . Elevated blood sugar 01/27/2019  . S/P mitral valve repair 12/31/2018  . Bilateral pulmonary contusion 10/17/2018   . Cardiac LV ejection fraction of 20-34% 10/17/2018  . Chronic systolic CHF (congestive heart failure) ([place]) 10/17/2018  . History of ischemic cardiomyopathy 10/17/2018  . ICD (implantable cardioverter-defibrillator) in place 10/17/2018  . S/p left hip fracture 10/17/2018  . Traumatic retroperitoneal hematoma 10/17/2018  . Age-related osteoporosis with current pathological fracture with routine healing 07/28/2018  . CAD (coronary artery disease) 07/21/2018  . GERD (gastroesophageal reflux disease) 07/21/2018  . Depression 07/21/2018  . CKD (chronic kidney disease), stage III 07/21/2018  . Iron deficiency anemia 07/21/2018  . Hypokalemia 07/21/2018  . Closed left hip fracture ([place]) 07/21/2018  . Fracture of femoral neck, left ([place]) 07/21/2018  . Lower extremity pain, bilateral 11/12/2017  . 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 Hyperlipidemia 09/01/2017  . Essential hypertension 09/01/2017  . PAD (peripheral artery disease) ([place]) 09/01/2017  . Anemia, unspecified 08/06/2017  . GAD (generalized anxiety disorder) 06/18/2017  . Prostate cancer screening 04/28/2017  . Hydrocele 04/28/2017  . Bradycardia   . Premature ventricular contraction   . Severe mitral insufficiency   . Cardiomyopathy, ischemic 04/26/2015  . Difficulty hearing 12/29/2014  . Acute on chronic systolic CHF (congestive heart failure) ([place]) 10/10/2014  . 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 Protein-calorie malnutrition, severe ([place]) 07/29/2014  . AKI (acute kidney injury) ([place]) 07/29/2014    Everlean Alstrom. Graylon Good, PT, DPT 04/26/20, 10:52 AM  [facility] [address] [place], [address] Phone: [phone]   Fax:  [phone]  Name: ELDEAN NANNA MRN: [id number] Date of Birth: Aug 02, 1951

## 2020-04-26 NOTE — Telephone Encounter (Signed)
Called sister, Kaleen Odea, and spoke to her about pt's care. Let her know I ended PT session early due to pt's complaints of feeling sweaty. Vitals normal. She said she would come get him.   Carl [name]. Graylon Good, PT, DPT 04/26/20, 10:54 AM

## 2020-04-26 NOTE — Telephone Encounter (Signed)
Attempted to call patient's POA to discuss care. No answer. No ability to leave message. Will try again at later time/date.   Everlean Alstrom. Graylon Good, PT, DPT 04/26/20, 9:58 AM

## 2020-04-27 ENCOUNTER — Ambulatory Visit (INDEPENDENT_AMBULATORY_CARE_PROVIDER_SITE_OTHER): Payer: Medicare Other

## 2020-04-27 DIAGNOSIS — I5022 Chronic systolic (congestive) heart failure: Secondary | ICD-10-CM

## 2020-04-27 DIAGNOSIS — Z95810 Presence of automatic (implantable) cardiac defibrillator: Secondary | ICD-10-CM | POA: Diagnosis not present

## 2020-04-27 LAB — CUP PACEART REMOTE DEVICE CHECK
Battery Remaining Longevity: 90 mo
Battery Voltage: 3.01 V
Brady Statistic RV Percent Paced: 0.03 %
Date Time Interrogation Session: 20210902072403
HighPow Impedance: 72 Ohm
Implantable Lead Implant Date: 20160906
Implantable Lead Location: 753860
Implantable Pulse Generator Implant Date: 20160906
Lead Channel Impedance Value: 323 Ohm
Lead Channel Impedance Value: 380 Ohm
Lead Channel Pacing Threshold Amplitude: 0.625 V
Lead Channel Pacing Threshold Pulse Width: 0.4 ms
Lead Channel Sensing Intrinsic Amplitude: 13.5 mV
Lead Channel Sensing Intrinsic Amplitude: 13.5 mV
Lead Channel Setting Pacing Amplitude: 2.5 V
Lead Channel Setting Pacing Pulse Width: 0.4 ms
Lead Channel Setting Sensing Sensitivity: 0.3 mV

## 2020-04-27 NOTE — Progress Notes (Signed)
EPIC Encounter for ICM Monitoring  Patient Name: Carl [name] is a 69 y.o. male Date: 04/27/2020 Primary Care Physican: [name], MD Primary Cardiologist:[place] Electrophysiologist: Allred 8/27/2021Office Weight: 165lbs  Time in AF0.0hr/day (0.0%)  Transmission Reviewed.   OptivolThoracic impedance normal.  Prescribed:  Torsemide20mg take 2 tablets(40mg  total)by mouthtwice a day.  Metolazone 2.5 mg take 1 tablet by mouth every Wednesday morning with 1 extra Potassium.   Klor Con 20 mEqTake 2 tablets (40 meq total) once a day.  Take extra 20 meq every Wednesday morning with Metolazone.  Labs: 04/26/2020 Creatinine 2.60, BUN 51, Potassium 4.0, Sodium 130, GFR 24-28 04/20/2020 Creatinine 2.75, BUN 43, Potassium 3.8, Sodium 129, GFR 23-26 01/27/2020 Creatinine 2.2, BUN 32, Potassium 4.0, Sodium 134, GFR 30 [facility]) 01/12/2020 Creatinine2.00, BUN64, Potassium3.6, Sodium129, [id number] 12/21/2019 Creatinine2.04, BUN31, Potassium4.6, Sodium131, [id number]  12/19/2019 Creatinine2.04, BUN35, Potassium4.2, [id number], [id number] 11/30/2019 Creatinine 1.87, BUN 38, Potassium 4.6, Sodium 131, GFR 36-42 A complete set of results can be found in Results Review  Recommendations:None  Follow-up plan: ICM clinic phone appointment on10/11/2019. 91 day device clinic remote transmission12/09/2019.  EP/Cardiology Office Visits:05/21/2020 with Advanced HF clinic   Copy of ICM check sent to Dr.Allred.   3 month ICM trend: 04/26/2020    1 Year ICM trend:       Rosalene Billings, RN 04/27/2020 4:24 PM

## 2020-05-01 ENCOUNTER — Other Ambulatory Visit: Payer: Self-pay

## 2020-05-01 ENCOUNTER — Ambulatory Visit: Payer: Medicare Other | Admitting: Physical Therapy

## 2020-05-01 DIAGNOSIS — R262 Difficulty in walking, not elsewhere classified: Secondary | ICD-10-CM

## 2020-05-01 DIAGNOSIS — R42 Dizziness and giddiness: Secondary | ICD-10-CM

## 2020-05-01 DIAGNOSIS — R2681 Unsteadiness on feet: Secondary | ICD-10-CM

## 2020-05-01 NOTE — Therapy (Signed)
[facility] [address] [place], [address] Phone: [phone]   Fax:  [phone]  Physical Therapy Treatment / Progress Note / Re-Certification Reporting period: 03/29/2020 - 05/01/2020  Patient Details  Name: Carl [name] MRN: [id number] Date of Birth: 04-Jan-1951 Referring Provider (PT): [name], NP   Encounter Date: 05/01/2020   PT End of Session - 05/01/20 1508    Visit Number 18    Number of Visits 24    Date for PT Re-Evaluation 06/05/20    Authorization Type [employer] Medicare Reporting period from 03/29/2020    Progress Note Due on Visit 20    PT Start Time 1405    PT Stop Time 1505    PT Time Calculation (min) 60 min    Equipment Utilized During Treatment Gait belt    Activity Tolerance Patient limited by fatigue;Other (comment)   cognition   Behavior During Therapy Flat affect;Impulsive           Past Medical History:  Diagnosis Date  . AICD (automatic cardioverter/defibrillator) present   . Anemia   . Anxiety   . Arthritis   . Brunner's gland hyperplasia of duodenum   . CHF (congestive heart failure) ([place])   . Coronary artery disease   . External hemorrhoids   . Fracture of femoral neck, left ([place]) 07/21/2018  . GERD (gastroesophageal reflux disease)   . Hearing loss   . HH (hiatus hernia)   . Internal hemorrhoids   . Ischemic cardiomyopathy   . Leg fracture, left   . Myocardial infarction ([place])   . Paronychia   . Pneumonia   . Psoriasis   . PUD (peptic ulcer disease)   . Schatzki's ring   . Tubular adenoma of colon   . Vitiligo     Past Surgical History:  Procedure Laterality Date  . ATRIAL SEPTAL DEFECT(ASD) CLOSURE N/A 12/30/2018   Procedure: ATRIAL SEPTAL DEFECT(ASD) CLOSURE;  Surgeon: [name], MD;  Location: [facility];  Service: Cardiovascular;  Laterality: N/A;  . CARDIAC SURGERY    . CARDIOVERSION N/A 11/09/2019   Procedure: CARDIOVERSION;  Surgeon:  [name], MD;  Location: [facility] ENDOSCOPY;  Service: Cardiovascular;  Laterality: N/A;  . CARDIOVERSION N/A 12/21/2019   Procedure: CARDIOVERSION;  Surgeon: [name], MD;  Location: [facility] ENDOSCOPY;  Service: Cardiovascular;  Laterality: N/A;  . COLONOSCOPY WITH ESOPHAGOGASTRODUODENOSCOPY (EGD)    . COLONOSCOPY WITH PROPOFOL N/A 08/24/2017   Procedure: COLONOSCOPY WITH PROPOFOL;  Surgeon: [name], MD;  Location: [facility] ENDOSCOPY;  Service: Endoscopy;  Laterality: N/A;  . CORONARY ARTERY BYPASS GRAFT N/A 08/03/2014   Procedure: CORONARY ARTERY BYPASS GRAFTING (CABG);  Surgeon: [name], MD;  Location: [facility];  Service: Open Heart Surgery;  Laterality: N/A;  . EP IMPLANTABLE DEVICE N/A 05/01/2015   MDT ICD implanted for primary prevention of sudden death  . ESOPHAGOGASTRODUODENOSCOPY (EGD) WITH PROPOFOL N/A 04/16/2015   Procedure: ESOPHAGOGASTRODUODENOSCOPY (EGD) WITH PROPOFOL;  Surgeon: [name], MD;  Location: [facility] ENDOSCOPY;  Service: Endoscopy;  Laterality: N/A;  . ESOPHAGOGASTRODUODENOSCOPY (EGD) WITH PROPOFOL N/A 08/24/2017   Procedure: ESOPHAGOGASTRODUODENOSCOPY (EGD) WITH PROPOFOL;  Surgeon: [name], MD;  Location: [facility] ENDOSCOPY;  Service: Endoscopy;  Laterality: N/A;  . FRACTURE SURGERY    . HERNIA REPAIR    . HIP PINNING,CANNULATED Left 07/22/2018   Procedure: CANNULATED HIP PINNING;  Surgeon: [name], MD;  Location: [facility];  Service: Orthopedics;  Laterality:  Left;  . MITRAL VALVE REPAIR N/A 12/30/2018   Procedure: MITRAL VALVE REPAIR;  Surgeon: [name], MD;  Location: [facility];  Service: Cardiovascular;  Laterality: N/A;  . RIGHT/LEFT HEART CATH AND CORONARY/GRAFT ANGIOGRAPHY N/A 12/21/2018   Procedure: RIGHT/LEFT HEART CATH AND CORONARY/GRAFT ANGIOGRAPHY;  Surgeon: [name], MD;  Location: [facility];  Service: Cardiovascular;  Laterality: N/A;  . TEE WITHOUT CARDIOVERSION N/A 08/03/2014   Procedure:  TRANSESOPHAGEAL ECHOCARDIOGRAM (TEE);  Surgeon: [name], MD;  Location: [facility];  Service: Open Heart Surgery;  Laterality: N/A;  . TEE WITHOUT CARDIOVERSION N/A 02/10/2018   Procedure: TRANSESOPHAGEAL ECHOCARDIOGRAM (TEE);  Surgeon: [name], MD;  Location: [facility] ENDOSCOPY;  Service: Cardiovascular;  Laterality: N/A;  . TEE WITHOUT CARDIOVERSION N/A 11/09/2019   Procedure: TRANSESOPHAGEAL ECHOCARDIOGRAM (TEE);  Surgeon: [name], MD;  Location: [facility] ENDOSCOPY;  Service: Cardiovascular;  Laterality: N/A;    There were no vitals filed for this visit.       [facility] PT Assessment - 05/01/20 0001      Assessment   Medical Diagnosis unsteadiness on feet, dizziness and giddiness    Referring Provider (PT) Fields, Colan Neptune, NP    Onset Date/Surgical Date --   unclear   Hand Dominance Left    Next MD Visit not sure which day    Prior Therapy two visits at main hospital      Precautions   Precautions Fall;ICD/Pacemaker   no salt     Restrictions   Weight Bearing Restrictions No      [place] residence    Living Arrangements Alone    Available Help at Discharge Family;Friend(s);Other (Comment)   has help with yard, cleaning home, medications, meals   Type of [place] to enter    Entrance Stairs-Number of Steps 3    Entrance Stairs-Rails Right;Left;Can reach both    [place] One level    [place] - single point;Walker - 2 wheels;Other (comment);Shower seat;Grab bars - tub/shower      Prior Function   Level of Independence Needs assistance with homemaking;Needs assistance with ADLs;Requires assistive device for independence;Independent with basic ADLs;Independent with household mobility with device;Other (comment)   used to be able to be completely independent "quite a while    Vocation Retired;On disability    Vocation Requirements used to work at [employer], 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     Leisure used to like to walk his dog      Cognition   Overall Cognitive Status History of cognitive impairments - at baseline    Memory Impaired    Executive Function --   impaired     Observation/Other Assessments   Focus on Therapeutic Outcomes (FOTO)  not appropriate due to cognitive deficits      Functional Gait  Assessment   Gait assessed  --   used SPC, CGA for all activities for safety   Gait Level Surface Walks 20 ft in less than 7 sec but greater than 5.5 sec, uses assistive device, slower speed, mild gait deviations, or deviates 6-10 in outside of the 12 in walkway width.   7 seconds   Change in Gait Speed Able to change speed, demonstrates mild gait deviations, deviates 6-10 in outside of the 12 in walkway width, or no gait deviations, unable to achieve a major change in velocity,  or uses a change in velocity, or uses an assistive device.    Gait with Horizontal Head Turns Performs head turns smoothly with slight change in gait velocity (eg, minor disruption to smooth gait path), deviates 6-10 in outside 12 in walkway width, or uses an assistive device.    Gait with Vertical Head Turns Performs task with moderate change in gait velocity, slows down, deviates 10-15 in outside 12 in walkway width but recovers, can continue to walk.    Gait and Pivot Turn Turns slowly, requires verbal cueing, or requires several small steps to catch balance following turn and stop    Step Over Obstacle Is able to step over one shoe box (4.5 in total height) but must slow down and adjust steps to clear box safely. May require verbal cueing.    Gait with Narrow Base of Support Ambulates 4-7 steps.    Gait with Eyes Closed Cannot walk 20 ft without assistance, severe gait deviations or imbalance, deviates greater than 15 in outside 12 in walkway width or will not attempt task.    Ambulating Backwards Walks 20 ft, uses assistive device, slower speed, mild  gait deviations, deviates 6-10 in outside 12 in walkway width.    Steps Alternating feet, must use rail.    Total Score 14    FGA comment: < 19 = high risk fall (last score 12/30 on 03/14/2020)   difficulty with cognition to follow instructions         OBJECTIVE  OBSERVATION/INSPECTION  Transfers:sit <> stand modified independent with increased time and UE use  Gait: grossly WFL for household and short community ambulationwith single point cane except significant unsteadiness noted at timesand high level of stumbling/staggering when not using AD.  FUNCTIONAL/BALANCE TESTS: Five Time Sit to Stand (5TSTS):15seconds from 18.5 inch plinth, No UE use. Poor hip/knee extension last rep Alternating toe taps to 8 inch surface, x4 each foot, in less than 20 seconds, CGA for support, no AD.   EDUCATION/COGNITION: Patient is alert and oriented X 4.   Objective measurements completed on examination: See above findings.   TREATMENT: Recent DVT in left elbow region High fall risk! Cognitive deficits  Neuromuscular Re-education:to improve, balance, postural strength, muscle activation patterns, and stabilization strength required for functional activities, SBA - min A provided for all standing tasks for safey: - standing narrow stance head turns and nods 3x20 each side. Transitioning from PT lead exercise to [name] (sister) leading patient and PT supporting as needed to improve ability to perform at home safely. - mini-lunges over red theraband on floor, 3x10 each side with U UE support for 2 sets. Education and trial to transition to [place] leading patient through exercise. She is uncomfortable with patient not holding on due to occasional staggering. Patient unable to perform with natural positions while holding on due to excessive grip on bar. Did not add to HEP.  - standing foot taps on 8 inch stool with unilateral UE support, 2x10 each side transitioning lead to sharon. Patient  able to complete well.  - Testing to assess progress: FGA, alternating toe taps to 8 inch surface (see above).   Therapeutic exercise:to centralize symptoms and improve ROM, strength, muscular endurance, and activity tolerance required for successful completion of functional activities.  - sit <> stand from 18 inch chair 1x10 (uncontrolled at deepest part of motion), 2x10 with two pillows in chair with hands on knees or no UE support (educating [name] on how to assist pt with performance at home) -  Testing to assess progress: 5TSTS (see above).  - education with patient and [name] about POC, progress, HEP, and walking in the community or stationary bike. [name] to bring walker she feels might be helpful and a picture of bike to next session).   Pt required multimodal cuing for proper technique and to facilitate improved neuromuscular control, strength, range of motion, and functional ability resulting in improved performance and form.  HOME EXERCISE PROGRAM Access Code: [email or web address] URL: [email or web address] Date: 05/01/2020 Prepared by: Rosita Kea  Program Notes DO NOT DO THIS EXERCISE ALONE!! COMPLETE ONLY WITH HELP FROM FAMILY MEMBER/CARGIVER.    Exercises Romberg Stance with Head Rotation - 1 x daily - 2 sets - 20 reps Romberg Stance with Head Nods - 1 x daily - 2 sets - 20 reps Step Taps on High Step - 1 x daily - 3 sets - 10 reps Sit to Stand without Arm Support - 1 x daily - 3 sets - 10 reps     PT Education - 05/01/20 1525    Education Details exercise form, posture, importance of having hearing aides, eating prior to visit, POC, progress, HEP    Person(s) Educated Child(ren);Caregiver(s);Patient    Methods Explanation;Demonstration;Tactile cues;Verbal cues;Handout    Comprehension Verbalized understanding;Returned demonstration;Verbal cues required;Tactile cues required;Need further instruction            PT Short Term Goals - 05/01/20 1528      PT  SHORT TERM GOAL #1   Title Patient/family indepndent with initial home exercise program for self-management of symptoms.    Baseline to be established as safe and appropriate visit 2 (02/08/2020); Patient requires step by step cuing to perform exercises safey with adequate guarding; family does not attend sessions, will continue to assess for ability to safely perform HEP (03/14/2020); initial HEP provided to patient and sister, sharon for her to assist him with (05/01/2020);    Time 2    Period Weeks    Status Partially Met    Target Date 02/23/20             PT Long Term Goals - 05/01/20 1528      PT LONG TERM GOAL #1   Title Family/patient is independent with a long-term home exercise program for self-management of symptoms.    Baseline to be established as safe and appropriate visit 2 (02/08/2020); Patient requires step by step cuing to perform exercises safey with adequate guarding; family does not attend sessions, will continue to assess for ability to safely perform HEP (03/14/2020); initial HEP provided to patient and sister, sharon for her to assist him with (05/01/2020);    Time 5    Period Weeks    Status On-going   TARGET DATE FOR ALL LONG TERM GOALS: 05/02/2020; Updated to 06/05/2020   Target Date 06/05/20      PT LONG TERM GOAL #2   Title Patient will score equal or more than 25/30 on Functional Gait Assessment to demonstrate low fall risk.    Baseline to be tested visit 2 (02/08/2020); 14/30 (02/13/2020); 12/30 (03/14/2020); 14/30 (05/01/2020);    Time 5    Period Weeks    Status On-going      PT LONG TERM GOAL #3   Title Patient will demonstrate the ability to complete sequential tapping steps on 8 inch step independently to demonstrate improved ability to navigate around objects without falling.    Baseline required support and repeated instructions (02/08/2020); able to complete with CGA and no loss of balance (  03/14/2020): able to complete with increased sway and CGA (05/01/2020);     Time 5    Period Weeks    Status Partially Met      PT LONG TERM GOAL #4   Title Patient will demonstrated ability to complete Five Time Sit to Stand Test in equal or less than 12 seconds from 18.5 inch surface without UE support to demonstrate improved balance and safety with transfers.    Baseline 17 seconds from 18.5 inch plinth, initial hand use. . (02/08/2020); 15 seconds from 18.5 inch plinth,No UE use. (03/14/2020; 05/01/2020);    Time 12    Period Weeks    Status Partially Met      PT LONG TERM GOAL #5   Title Patient will report less than 1 fall over 8 weeks to demonstrate improved safety with functional mobility.    Baseline at least 5 falls in the last 6 months (02/08/2020); reports no falls over the last 5 weeks (03/14/2020); one fall over 12 weeks (05/01/2020);    Time 12    Period Weeks    Status Achieved                 Plan - 05/01/20 1522    Clinical Impression Statement Patient has attended 18 physical therapy sessions and has made minimal progress overall since starting PT. His sister, [name], stayed and participated in his session today resulting in HEP that she can assist him with at home. Also spoke with his POA and brother [name], who suggested he needed to come later in the day due to not eating breakfast. Advised on what is important for patient to be prepared to maximize success during sessions (hearing aides, having eaten). Patient has significant cognitive deficits that impair problem solving, ability to follow written directions, or safely perform exercise independently. He has been attending PT with minimal communication with caregivers up to now that has limited ability to transition to long term HEP. Plan to focus on establishing long term HEP program that patient can work on with his family/caregivers and then discharge from PT at that time due to lack of further progress. Patient has improved 5TSTS slightly and has only had one fall since starting PT. He continues  to be confused and require step by step single commands to perform each exercise and close guarding for safety. Patient would benefit from continued management of limiting condition by skilled physical therapist to address remaining impairments and functional limitations to work towards stated goals and return to PLOF or maximal functional independence.    Personal Factors and Comorbidities Age;Time since onset of injury/illness/exacerbation;Behavior Pattern;Comorbidity 3+;Education;Social Background;Past/Current Experience;Fitness;Other    Comorbidities Relevant past medical history and comorbidities are extensive (see above) with particular relevance of cognitive impairment/decline, repeated L femur fracture, presence of AICD, low blood pressure, former smoker, MI with 5x bipass, ischemic cardiomyopathy, CHF, chronic Afib with recent need for cardioversion, neck pain, leg pain, DVT in left arm (recently cleared), cellulitis of L LE (recently treated).    Examination-Activity Limitations Carry;Stairs;Sit;Reach Overhead;Locomotion Level;Stand;Lift;Dressing;Bend;Transfers;Other    Examination-Participation Restrictions Community Activity;Cleaning;Meal Prep;Medication Management;Driving;Shop;Yard Work    [employer]chant [employer] officer Evolving/Moderate complexity    Rehab Potential Fair    PT Frequency 2x / week    PT Duration 12 weeks    PT Treatment/Interventions ADLs/Self Care Home Management;Cryotherapy;[place] Repostioning;Moist Heat;Gait training;Stair training;Functional mobility training;Therapeutic activities;Therapeutic exercise;Balance training;Neuromuscular re-education;Cognitive remediation;Patient/family education;Manual techniques;Dry needling;Vestibular;Visual/perceptual remediation/compensation;Joint Manipulations;Spinal Manipulations    PT Next Visit Plan work towards discharge to HEP with  assistance from family    PT [place]: Access Code: [email or web address]    Consulted  and Agree with Plan of Care Patient;Family member/caregiver    Family Member Consulted sister, [name]           Patient will benefit from skilled therapeutic intervention in order to improve the following deficits and impairments:  Abnormal gait, Decreased cognition, Decreased knowledge of use of DME, Dizziness, Improper body mechanics, Pain, Impaired tone, Decreased coordination, Decreased mobility, Decreased activity tolerance, Decreased endurance, Difficulty walking, Decreased safety awareness, Decreased knowledge of precautions, Decreased balance  Visit Diagnosis: Difficulty in walking, not elsewhere classified  Unsteadiness on feet  Dizziness and giddiness     Problem List Patient Active Problem List   Diagnosis Date Noted  . Persistent atrial fibrillation ([place]) 11/30/2019  . Secondary hypercoagulable state ([place]) 11/30/2019  . Arm DVT (deep venous thromboembolism), acute, left ([place]) 11/26/2019  . Hx of adenomatous colonic polyps 05/18/2019  . Cirrhosis, non-alcoholic ([place]) 61/95/0932  . Occult blood positive stool 05/12/2019  . HCAP (healthcare-associated pneumonia) 03/04/2019  . Hallucinations 02/10/2019  . Elevated blood sugar 01/27/2019  . S/P mitral valve repair 12/31/2018  . Bilateral pulmonary contusion 10/17/2018  . Cardiac LV ejection fraction of 20-34% 10/17/2018  . Chronic systolic CHF (congestive heart failure) ([place]) 10/17/2018  . History of ischemic cardiomyopathy 10/17/2018  . ICD (implantable cardioverter-defibrillator) in place 10/17/2018  . S/p left hip fracture 10/17/2018  . Traumatic retroperitoneal hematoma 10/17/2018  . Age-related osteoporosis with current pathological fracture with routine healing 07/28/2018  . CAD (coronary artery disease) 07/21/2018  . GERD (gastroesophageal reflux disease) 07/21/2018  . Depression 07/21/2018  . CKD (chronic kidney disease), stage III 07/21/2018  . Iron deficiency anemia 07/21/2018  . Hypokalemia 07/21/2018   . Closed left hip fracture ([place]) 07/21/2018  . Fracture of femoral neck, left ([place]) 07/21/2018  . Lower extremity pain, bilateral 11/12/2017  . 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 Hyperlipidemia 09/01/2017  . Essential hypertension 09/01/2017  . PAD (peripheral artery disease) ([place]) 09/01/2017  . Anemia, unspecified 08/06/2017  . GAD (generalized anxiety disorder) 06/18/2017  . Prostate cancer screening 04/28/2017  . Hydrocele 04/28/2017  . Bradycardia   . Premature ventricular contraction   . Severe mitral insufficiency   . Cardiomyopathy, ischemic 04/26/2015  . Difficulty hearing 12/29/2014  . Acute on chronic systolic CHF (congestive heart failure) ([place]) 10/10/2014  . 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 Protein-calorie malnutrition, severe ([place]) 07/29/2014  . AKI (acute kidney injury) ([place]) 07/29/2014    Everlean Alstrom. Graylon Good, PT, DPT 05/01/20, 3:32 PM  [facility] [address], [place], [address] Phone: [phone]   Fax:  [phone]  Name: Carl [name] MRN: [id number] Date of Birth: August 14, 1951

## 2020-05-01 NOTE — Progress Notes (Signed)
Remote ICD transmission.   

## 2020-05-02 ENCOUNTER — Other Ambulatory Visit (HOSPITAL_COMMUNITY): Payer: Self-pay

## 2020-05-02 ENCOUNTER — Encounter (HOSPITAL_COMMUNITY): Payer: Self-pay

## 2020-05-02 NOTE — Progress Notes (Signed)
Today had a home visit with Carl [name].  He states feeling ok today.  He states his legs are ok, he is not really talking a lot.  He states he is switching physical therapy to hospital, he does not like where he is going.  It appears he is taking his medicaitons.  He has gained 4 lbs since last visit, have cut his torsemide back.  He denies any shortness of breath, headaches or dizziness.  He has no swelling in his extremities and abdomen is not tight.  He denies chest pain.  He taked metolazone today.  Will call to get his weight tomorrow.  He has his medications, refilled his boxes.  He has not took his afternoon meds out of his box today, advised him he needs to eat lunch and take his meds around 12 or 1 today.  He appears to understand and states he has been.  He lives alone and has been concern of him staying by his self.  Brother Carl [name] has been looking for someone to come by to stay with Carl [name] during the day to cook and do simple things around the house for him.  He has not found no one yet.  Will continue to visit for heart failure, diet and medication compliance.   [place] [phone]

## 2020-05-03 ENCOUNTER — Encounter: Payer: Self-pay | Admitting: Physical Therapy

## 2020-05-03 ENCOUNTER — Other Ambulatory Visit: Payer: Self-pay

## 2020-05-03 ENCOUNTER — Ambulatory Visit: Payer: Medicare Other | Admitting: Physical Therapy

## 2020-05-03 DIAGNOSIS — R42 Dizziness and giddiness: Secondary | ICD-10-CM

## 2020-05-03 DIAGNOSIS — R262 Difficulty in walking, not elsewhere classified: Secondary | ICD-10-CM

## 2020-05-03 DIAGNOSIS — R2681 Unsteadiness on feet: Secondary | ICD-10-CM

## 2020-05-03 NOTE — Therapy (Signed)
[facility] [address] [place], [address] Phone: [phone]   Fax:  [phone]  Physical Therapy Treatment  Patient Details  Name: Carl [name] MRN: [id number] Date of Birth: [birth year] Referring Provider (PT): [name], NP   Encounter Date: 05/03/2020   PT End of Session - 05/03/20 2001    Visit Number 19    Number of Visits 24    Date for PT Re-Evaluation 06/05/20    Authorization Type [employer] Medicare Reporting period from 03/29/2020    Progress Note Due on Visit 20    PT Start Time 1505    PT Stop Time 1545    PT Time Calculation (min) 40 min    Equipment Utilized During Treatment Gait belt    Activity Tolerance Patient limited by fatigue;Other (comment)   cognition   Behavior During Therapy Flat affect;Impulsive           Past Medical History:  Diagnosis Date  . AICD (automatic cardioverter/defibrillator) present   . Anemia   . Anxiety   . Arthritis   . Brunner's gland hyperplasia of duodenum   . CHF (congestive heart failure) ([place])   . Coronary artery disease   . External hemorrhoids   . Fracture of femoral neck, left ([place]) 07/21/2018  . GERD (gastroesophageal reflux disease)   . Hearing loss   . HH (hiatus hernia)   . Internal hemorrhoids   . Ischemic cardiomyopathy   . Leg fracture, left   . Myocardial infarction ([place])   . Paronychia   . Pneumonia   . Psoriasis   . PUD (peptic ulcer disease)   . Schatzki's ring   . Tubular adenoma of colon   . Vitiligo     Past Surgical History:  Procedure Laterality Date  . ATRIAL SEPTAL DEFECT(ASD) CLOSURE N/A 12/30/2018   Procedure: ATRIAL SEPTAL DEFECT(ASD) CLOSURE;  Surgeon: [name], MD;  Location: [place] [facility];  Service: Cardiovascular;  Laterality: N/A;  . CARDIAC SURGERY    . CARDIOVERSION N/A 11/09/2019   Procedure: CARDIOVERSION;  Surgeon: [name], MD;  Location: [facility] ENDOSCOPY;  Service: Cardiovascular;   Laterality: N/A;  . CARDIOVERSION N/A 12/21/2019   Procedure: CARDIOVERSION;  Surgeon: [name], MD;  Location: [facility] ENDOSCOPY;  Service: Cardiovascular;  Laterality: N/A;  . COLONOSCOPY WITH ESOPHAGOGASTRODUODENOSCOPY (EGD)    . COLONOSCOPY WITH PROPOFOL N/A 08/24/2017   Procedure: COLONOSCOPY WITH PROPOFOL;  Surgeon: [name], MD;  Location: [facility] ENDOSCOPY;  Service: Endoscopy;  Laterality: N/A;  . CORONARY ARTERY BYPASS GRAFT N/A 08/03/2014   Procedure: CORONARY ARTERY BYPASS GRAFTING (CABG);  Surgeon: [name], MD;  Location: [place];  Service: Open Heart Surgery;  Laterality: N/A;  . EP IMPLANTABLE DEVICE N/A 05/01/2015   MDT ICD implanted for primary prevention of sudden death  . ESOPHAGOGASTRODUODENOSCOPY (EGD) WITH PROPOFOL N/A 04/16/2015   Procedure: ESOPHAGOGASTRODUODENOSCOPY (EGD) WITH PROPOFOL;  Surgeon: [name], MD;  Location: [facility] ENDOSCOPY;  Service: Endoscopy;  Laterality: N/A;  . ESOPHAGOGASTRODUODENOSCOPY (EGD) WITH PROPOFOL N/A 08/24/2017   Procedure: ESOPHAGOGASTRODUODENOSCOPY (EGD) WITH PROPOFOL;  Surgeon: [name], MD;  Location: [facility] ENDOSCOPY;  Service: Endoscopy;  Laterality: N/A;  . FRACTURE SURGERY    . HERNIA REPAIR    . HIP PINNING,CANNULATED Left 07/22/2018   Procedure: CANNULATED HIP PINNING;  Surgeon: [name], MD;  Location: [facility];  Service: Orthopedics;  Laterality: Left;  . MITRAL VALVE REPAIR N/A 12/30/2018  Procedure: MITRAL VALVE REPAIR;  Surgeon: [name], MD;  Location: [place] [facility];  Service: Cardiovascular;  Laterality: N/A;  . RIGHT/LEFT HEART CATH AND CORONARY/GRAFT ANGIOGRAPHY N/A 12/21/2018   Procedure: RIGHT/LEFT HEART CATH AND CORONARY/GRAFT ANGIOGRAPHY;  Surgeon: [name], MD;  Location: [place] [facility];  Service: Cardiovascular;  Laterality: N/A;  . TEE WITHOUT CARDIOVERSION N/A 08/03/2014   Procedure: TRANSESOPHAGEAL ECHOCARDIOGRAM (TEE);  Surgeon: [name], MD;  Location: [facility];  Service: Open Heart Surgery;  Laterality: N/A;  . TEE WITHOUT CARDIOVERSION N/A 02/10/2018   Procedure: TRANSESOPHAGEAL ECHOCARDIOGRAM (TEE);  Surgeon: [name], MD;  Location: [facility] ENDOSCOPY;  Service: Cardiovascular;  Laterality: N/A;  . TEE WITHOUT CARDIOVERSION N/A 11/09/2019   Procedure: TRANSESOPHAGEAL ECHOCARDIOGRAM (TEE);  Surgeon: [name], MD;  Location: [facility] ENDOSCOPY;  Service: Cardiovascular;  Laterality: N/A;    There were no vitals filed for this visit.   Subjective Assessment - 05/03/20 1958    Subjective Patient arrived with Carl [name] his sister who stayed for the entirety of the appoinment. He forgot his hearing aides. States his left great toe is hurting but is otherwise feeling okay. When asked about telling Carl [name] about not liking PT here and wanting to go to change to hospital, Carl [name] stated he used to go to the hospital to use exercise equipment and he liked it there. She thought he was talking about our plan from last session to possibly utilize the [facility] at the hospital for long term exercise. Pateint appeared to have difficulty following conversation.    Patient is accompained by: Family member    Pertinent History Patient is a 69 y.o. male who presents to outpatient physical therapy with a referral for medical diagnosis unsteadiness on feet, dizziness and giddiness. This patient's chief complaints consist of feeling unsteady on feet and occasional falling leading to the following functional deficits: difficulty with basic mobility including household and community ambulation, frequent injuries and pain from falling, completing ADLs, IADLs, safety and quality of life. Relevant past medical history and comorbidities are extensive (see above) with particular relevance of cognitive impairment/decline, repeated L femur fracture, presence of AICD, low blood pressure, former smoker, MI with 5x bipass, ischemic cardiomyopathy, CHF, chronic Afib with recent need for  cardioversion, neck pain, leg pain, DVT in left arm (recently cleared), cellulitis of L LE (recently treated).  Patient denies hx of cancer, stroke, seizures, lung problem, diabetes, unexplained weight loss.    Limitations Lifting;Standing;Walking;House hold activities    Diagnostic tests US venous Img Upper Uni Lft report 11/20/2019: "IMPRESSION:Nonocclusive thrombus within the left brachial vein near theantecubital fossa" resolved per note on 5/27/2021CT Lumbar spine report 06/19/2020:"IMPRESSION:1. Healing right sacral ala fracture. This likely contributes to thepatient's right-sided hip pain.2. Dynamic listhesis with mild subarticular and foraminal stenosisbilaterally at L4-5. The stenosis is likely worse with standing andflexion.3. Mild right foraminal narrowing at L2-3.4. Broad-based disc protrusion at L2-3 and L3-4 extends into theforamina bilaterally both levels without other significant stenosis.5. Moderate left and mild right foraminal stenosis at L5-S1secondary to chronic broad-based disc protrusion and facethypertrophy. Mild left subarticular narrowing is present as well.6.  Aortic Atherosclerosis (ICD10-I70.0)."    Currently in Pain? Yes    Pain Score --   unable to rate   Pain Location Other (Comment)   left great toe   Pain Orientation Left           TREATMENT: Recent DVT in left elbow region High fall risk! Cognitive deficits  Neuromuscular Re-education:to improve,  balance, postural strength, muscle activation patterns, and stabilization strength required for functional activities, SBA - min A provided for all standing tasks for safey: - standing narrow stance head turns and nods 2x20 each side.  - standing foot taps on 8 inch stool 3x10 each side  Therapeutic exercise:to centralize symptoms and improve ROM, strength, muscular endurance, and activity tolerance required for successful completion of functional activities. - sit <> stand from 18 inch chair with two pillows in  chair with hands on knees or no UE support. 3x10 (discussed with Carl [name] on how to assist pt with performance at home)  - Recumbent Bike level 5. Seat setting 15 For improved lower extremity ROM, muscular endurance, and activity tolerance; and to induce the analgesic effect of aerobic exercise, stimulate joint nutrition. 5 min to test ability to perform at home.  - ambulation outside ~ 300 feet with SPC and SBA. Drifts slightly R and bumped column on the right.  - education with patient and Carl [name] about POC, HEP, [facility] referral, and walking in the community or stationary bike.   Pt required multimodal cuing for proper technique and to facilitate improved neuromuscular control, strength, range of motion, and functional ability resulting in improved performance and form.  HOME EXERCISE PROGRAM Access Code: [email or web address] URL: [email or web address] Date: 05/01/2020 Prepared by: Rosita Kea  Program Notes DO NOT DO THIS EXERCISE ALONE!! COMPLETE ONLY WITH HELP FROM FAMILY MEMBER/CARGIVER.    Exercises Romberg Stance with Head Rotation - 1 x daily - 2 sets - 20 reps Romberg Stance with Head Nods - 1 x daily - 2 sets - 20 reps Step Taps on High Step - 1 x daily - 3 sets - 10 reps Sit to Stand without Arm Support - 1 x daily - 3 sets - 10 reps    PT Education - 05/03/20 2001    Education Details exercise form, posture. Self/home management techniques    Person(s) Educated Patient;Child(ren);Caregiver(s)    Methods Explanation;Demonstration;Tactile cues;Verbal cues    Comprehension Verbalized understanding;Returned demonstration;Verbal cues required;Tactile cues required;Need further instruction            PT Short Term Goals - 05/01/20 1528      PT SHORT TERM GOAL #1   Title Patient/family indepndent with initial home exercise program for self-management of symptoms.    Baseline to be established as safe and appropriate visit 2 (02/08/2020); Patient requires step by  step cuing to perform exercises safey with adequate guarding; family does not attend sessions, will continue to assess for ability to safely perform HEP (03/14/2020); initial HEP provided to patient and sister, Carl [name] for her to assist him with (05/01/2020);    Time 2    Period Weeks    Status Partially Met    Target Date 02/23/20             PT Long Term Goals - 05/01/20 1528      PT LONG TERM GOAL #1   Title Family/patient is independent with a long-term home exercise program for self-management of symptoms.    Baseline to be established as safe and appropriate visit 2 (02/08/2020); Patient requires step by step cuing to perform exercises safey with adequate guarding; family does not attend sessions, will continue to assess for ability to safely perform HEP (03/14/2020); initial HEP provided to patient and sister, Carl [name] for her to assist him with (05/01/2020);    Time 5    Period Weeks    Status On-going   TARGET DATE FOR ALL LONG  TERM GOALS: 05/02/2020; Updated to 06/05/2020   Target Date 06/05/20      PT LONG TERM GOAL #2   Title Patient will score equal or more than 25/30 on Functional Gait Assessment to demonstrate low fall risk.    Baseline to be tested visit 2 (02/08/2020); 14/30 (02/13/2020); 12/30 (03/14/2020); 14/30 (05/01/2020);    Time 5    Period Weeks    Status On-going      PT LONG TERM GOAL #3   Title Patient will demonstrate the ability to complete sequential tapping steps on 8 inch step independently to demonstrate improved ability to navigate around objects without falling.    Baseline required support and repeated instructions (02/08/2020); able to complete with CGA and no loss of balance (03/14/2020): able to complete with increased sway and CGA (05/01/2020);    Time 5    Period Weeks    Status Partially Met      PT LONG TERM GOAL #4   Title Patient will demonstrated ability to complete Five Time Sit to Stand Test in equal or less than 12 seconds from 18.5 inch surface  without UE support to demonstrate improved balance and safety with transfers.    Baseline 17 seconds from 18.5 inch plinth, initial hand use. . (02/08/2020); 15 seconds from 18.5 inch plinth,No UE use. (03/14/2020; 05/01/2020);    Time 12    Period Weeks    Status Partially Met      PT LONG TERM GOAL #5   Title Patient will report less than 1 fall over 8 weeks to demonstrate improved safety with functional mobility.    Baseline at least 5 falls in the last 6 months (02/08/2020); reports no falls over the last 5 weeks (03/14/2020); one fall over 12 weeks (05/01/2020);    Time 12    Period Weeks    Status Achieved                 Plan - 05/03/20 2008    Clinical Impression Statement Patient tolerated treatment well overall and continues to require step by step instructions for each part of each exercise to be able to perform safely. Failed to position himself safely without cuing to stop his attempts to perform exercise and adjust to safer position with step touches. Also bumped column when ambulating outside with SPC and SBA resulting in small skin break at R forearm. Cleaned with alcohol swab and Band-Aid applied with 60 seconds of pressure. Patient required assistance with getting on and off bike safety, walking safely, and each exercise. Carl [name] forgot to bring the walker, so did not try the walker for outdoor walking. Carl [name] has not attempted to help pt with HEP at this time. Georgiann Mohs that pt could join [employer] but would need another adult to accompany him due to dementia. She agreed and referral was sent to [facility]. Plan to assess ability for Carl [name] to perform HEP at next session and discharge patient from PT at that time if reasonable. Patient would benefit from continued management of limiting condition by skilled physical therapist to address remaining impairments and functional limitations to work towards stated goals and return to PLOF or maximal functional independence.    Personal  Factors and Comorbidities Age;Time since onset of injury/illness/exacerbation;Behavior Pattern;Comorbidity 3+;Education;Social Background;Past/Current Experience;Fitness;Other    Comorbidities Relevant past medical history and comorbidities are extensive (see above) with particular relevance of cognitive impairment/decline, repeated L femur fracture, presence of AICD, low blood pressure, former smoker, MI with 5x bipass, ischemic cardiomyopathy,  CHF, chronic Afib with recent need for cardioversion, neck pain, leg pain, DVT in left arm (recently cleared), cellulitis of L LE (recently treated).    Examination-Activity Limitations Carry;Stairs;Sit;Reach Overhead;Locomotion Level;Stand;Lift;Dressing;Bend;Transfers;Other    Examination-Participation Restrictions Community Activity;Cleaning;Meal Prep;Medication Management;Driving;Shop;Yard Work    [employer]chant [employer] Evolving/Moderate complexity    Rehab Potential Fair    PT Frequency 2x / week    PT Duration 12 weeks    PT Treatment/Interventions ADLs/Self Care Home Management;Cryotherapy;Canalith Repostioning;Moist Heat;Gait training;Stair training;Functional mobility training;Therapeutic activities;Therapeutic exercise;Balance training;Neuromuscular re-education;Cognitive remediation;Patient/family education;Manual techniques;Dry needling;Vestibular;Visual/perceptual remediation/compensation;Joint Manipulations;Spinal Manipulations    PT Next Visit Plan work towards discharge to HEP with assistance from family    PT Reader: Access Code: [email or web address]    Consulted and Agree with Plan of Care Patient;Family member/caregiver    Family Member Consulted sister, Carl [name]           Patient will benefit from skilled therapeutic intervention in order to improve the following deficits and impairments:  Abnormal gait, Decreased cognition, Decreased knowledge of use of DME, Dizziness, Improper body mechanics, Pain, Impaired tone,  Decreased coordination, Decreased mobility, Decreased activity tolerance, Decreased endurance, Difficulty walking, Decreased safety awareness, Decreased knowledge of precautions, Decreased balance  Visit Diagnosis: Difficulty in walking, not elsewhere classified  Unsteadiness on feet  Dizziness and giddiness     Problem List Patient Active Problem List   Diagnosis Date Noted  . Persistent atrial fibrillation ([place]) 11/30/2019  . Secondary hypercoagulable state ([place]) 11/30/2019  . Arm DVT (deep venous thromboembolism), acute, left ([place]) 11/26/2019  . Hx of adenomatous colonic polyps 05/18/2019  . Cirrhosis, non-alcoholic ([place]) 60/60/0459  . Occult blood positive stool 05/12/2019  . HCAP (healthcare-associated pneumonia) 03/04/2019  . Hallucinations 02/10/2019  . Elevated blood sugar 01/27/2019  . S/P mitral valve repair 12/31/2018  . Bilateral pulmonary contusion 10/17/2018  . Cardiac LV ejection fraction of 20-34% 10/17/2018  . Chronic systolic CHF (congestive heart failure) ([place]) 10/17/2018  . History of ischemic cardiomyopathy 10/17/2018  . ICD (implantable cardioverter-defibrillator) in place 10/17/2018  . S/p left hip fracture 10/17/2018  . Traumatic retroperitoneal hematoma 10/17/2018  . Age-related osteoporosis with current pathological fracture with routine healing 07/28/2018  . CAD (coronary artery disease) 07/21/2018  . GERD (gastroesophageal reflux disease) 07/21/2018  . Depression 07/21/2018  . CKD (chronic kidney disease), stage III 07/21/2018  . Iron deficiency anemia 07/21/2018  . Hypokalemia 07/21/2018  . Closed left hip fracture ([place]) 07/21/2018  . Fracture of femoral neck, left ([place]) 07/21/2018  . Lower extremity pain, bilateral 11/12/2017  . 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 Hyperlipidemia 09/01/2017  . Essential hypertension 09/01/2017  . PAD (peripheral artery disease) ([place]) 09/01/2017  . Anemia, unspecified 08/06/2017  .  GAD (generalized anxiety disorder) 06/18/2017  . Prostate cancer screening 04/28/2017  . Hydrocele 04/28/2017  . Bradycardia   . Premature ventricular contraction   . Severe mitral insufficiency   . Cardiomyopathy, ischemic 04/26/2015  . Difficulty hearing 12/29/2014  . Acute on chronic systolic CHF (congestive heart failure) ([place]) 10/10/2014  . 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 Protein-calorie malnutrition, severe ([place]) 07/29/2014  . AKI (acute kidney injury) ([place]) 07/29/2014    Everlean Alstrom. Graylon Good, PT, DPT 05/03/20, 8:10 PM   [facility] [address], [place], [address] Phone: [phone]   Fax:  [phone]  Name: JAQUISE FAUX MRN: [id number] Date of Birth: 04/11/1951

## 2020-05-08 ENCOUNTER — Encounter: Payer: Self-pay | Admitting: Physical Therapy

## 2020-05-08 ENCOUNTER — Other Ambulatory Visit: Payer: Self-pay

## 2020-05-08 ENCOUNTER — Ambulatory Visit: Payer: Medicare Other | Admitting: Physical Therapy

## 2020-05-08 DIAGNOSIS — R2681 Unsteadiness on feet: Secondary | ICD-10-CM

## 2020-05-08 DIAGNOSIS — R262 Difficulty in walking, not elsewhere classified: Secondary | ICD-10-CM | POA: Diagnosis not present

## 2020-05-08 DIAGNOSIS — R42 Dizziness and giddiness: Secondary | ICD-10-CM

## 2020-05-08 NOTE — Therapy (Signed)
[facility] [address] [place], [address] Phone: [phone]   Fax:  [phone]  Physical Therapy Treatment / Discharge Summary Reporting period: 02/08/2020 - 05/08/2020  Patient Details  Name: Carl [name] MRN: [id number] Date of Birth: 01-16-1951 Referring Provider (PT): [name], NP   Encounter Date: 05/08/2020   PT End of Session - 05/08/20 1535    Visit Number 20    Number of Visits 24    Date for PT Re-Evaluation 06/05/20    Authorization Type [employer] Medicare Reporting period from 03/29/2020    Progress Note Due on Visit 20    PT Start Time 1358    PT Stop Time 1423    PT Time Calculation (min) 25 min    Equipment Utilized During Treatment Gait belt    Activity Tolerance Patient limited by fatigue;Other (comment)   cognition   Behavior During Therapy Flat affect;Impulsive           Past Medical History:  Diagnosis Date  . AICD (automatic cardioverter/defibrillator) present   . Anemia   . Anxiety   . Arthritis   . Brunner's gland hyperplasia of duodenum   . CHF (congestive heart failure) ([place])   . Coronary artery disease   . External hemorrhoids   . Fracture of femoral neck, left ([place]) 07/21/2018  . GERD (gastroesophageal reflux disease)   . Hearing loss   . HH (hiatus hernia)   . Internal hemorrhoids   . Ischemic cardiomyopathy   . Leg fracture, left   . Myocardial infarction ([place])   . Paronychia   . Pneumonia   . Psoriasis   . PUD (peptic ulcer disease)   . Schatzki's ring   . Tubular adenoma of colon   . Vitiligo     Past Surgical History:  Procedure Laterality Date  . ATRIAL SEPTAL DEFECT(ASD) CLOSURE N/A 12/30/2018   Procedure: ATRIAL SEPTAL DEFECT(ASD) CLOSURE;  Surgeon: [name], MD;  Location: [place] [facility];  Service: Cardiovascular;  Laterality: N/A;  . CARDIAC SURGERY    . CARDIOVERSION N/A 11/09/2019   Procedure: CARDIOVERSION;  Surgeon: [name], MD;  Location: [facility] ENDOSCOPY;  Service: Cardiovascular;  Laterality: N/A;  . CARDIOVERSION N/A 12/21/2019   Procedure: CARDIOVERSION;  Surgeon: [name], MD;  Location: [facility] END[facility]COPY;  Service: Cardiovascular;  Laterality: N/A;  . COLONOSCOPY WITH ESOPHAGOGASTRODUODENOSCOPY (EGD)    . COLONOSCOPY WITH PROPOFOL N/A 08/24/2017   Procedure: COLONOSCOPY WITH PROPOFOL;  Surgeon: [name], MD;  Location: [facility] ENDOSCOPY;  Service: Endoscopy;  Laterality: N/A;  . CORONARY ARTERY BYPASS GRAFT N/A 08/03/2014   Procedure: CORONARY ARTERY BYPASS GRAFTING (CABG);  Surgeon: [name], MD;  Location: [place];  Service: Open Heart Surgery;  Laterality: N/A;  . EP IMPLANTABLE DEVICE N/A 05/01/2015   MDT ICD implanted for primary prevention of sudden death  . ESOPHAGOGASTRODUODENOSCOPY (EGD) WITH PROPOFOL N/A 04/16/2015   Procedure: ESOPHAGOGASTRODUODENOSCOPY (EGD) WITH PROPOFOL;  Surgeon: [name], MD;  Location: [facility] ENDOSCOPY;  Service: Endoscopy;  Laterality: N/A;  . ESOPHAGOGASTRODUODENOSCOPY (EGD) WITH PROPOFOL N/A 08/24/2017   Procedure: ESOPHAGOGASTRODUODENOSCOPY (EGD) WITH PROPOFOL;  Surgeon: [name], MD;  Location: [facility] ENDOSCOPY;  Service: Endoscopy;  Laterality: N/A;  . FRACTURE SURGERY    . HERNIA REPAIR    . HIP PINNING,CANNULATED Left 07/22/2018   Procedure: CANNULATED HIP PINNING;  Surgeon: [name], MD;  Location: [place];  Service: Orthopedics;  Laterality: Left;  .  MITRAL VALVE REPAIR N/A 12/30/2018   Procedure: MITRAL VALVE REPAIR;  Surgeon: [name], MD;  Location: [place] [facility];  Service: Cardiovascular;  Laterality: N/A;  . RIGHT/LEFT HEART CATH AND CORONARY/GRAFT ANGIOGRAPHY N/A 12/21/2018   Procedure: RIGHT/LEFT HEART CATH AND CORONARY/GRAFT ANGIOGRAPHY;  Surgeon: [name], MD;  Location: [place] [facility];  Service: Cardiovascular;  Laterality: N/A;  . TEE WITHOUT CARDIOVERSION N/A 08/03/2014   Procedure: TRANSESOPHAGEAL  ECHOCARDIOGRAM (TEE);  Surgeon: [name], MD;  Location: [place];  Service: Open Heart Surgery;  Laterality: N/A;  . TEE WITHOUT CARDIOVERSION N/A 02/10/2018   Procedure: TRANSESOPHAGEAL ECHOCARDIOGRAM (TEE);  Surgeon: [name], MD;  Location: [facility] ENDOSCOPY;  Service: Cardiovascular;  Laterality: N/A;  . TEE WITHOUT CARDIOVERSION N/A 11/09/2019   Procedure: TRANSESOPHAGEAL ECHOCARDIOGRAM (TEE);  Surgeon: [name], MD;  Location: [facility] ENDOSCOPY;  Service: Cardiovascular;  Laterality: N/A;    There were no vitals filed for this visit.   Subjective Assessment - 05/08/20 1413    Subjective Patient arrived with sister today who states she cannot stay for the session because she lost her glassess and must get that taken care of. Patient is 13 min late and has his hearing aides and SPC. [name] (sister) states she did a little exercise with him since last session, mostly walking, and did not do HEP. State she has not received a call yet from [facility] and would like their number to call. Agreed that today would be patient's last day at PT and she had no further questions or concerns at this point. Patient states he has some moderate pain in both ankles upon arrival and agrees he feels okay and ready for PT.    Patient is accompained by: Family member    Pertinent History Patient is a 69 y.o. male who presents to outpatient physical therapy with a referral for medical diagnosis unsteadiness on feet, dizziness and giddiness. This patient's chief complaints consist of feeling unsteady on feet and occasional falling leading to the following functional deficits: difficulty with basic mobility including household and community ambulation, frequent injuries and pain from falling, completing ADLs, IADLs, safety and quality of life. Relevant past medical history and comorbidities are extensive (see above) with particular relevance of cognitive impairment/decline, repeated L femur fracture, presence of AICD,  low blood pressure, former smoker, MI with 5x bipass, ischemic cardiomyopathy, CHF, chronic Afib with recent need for cardioversion, neck pain, leg pain, DVT in left arm (recently cleared), cellulitis of L LE (recently treated).  Patient denies hx of cancer, stroke, seizures, lung problem, diabetes, unexplained weight loss.    Limitations Lifting;Standing;Walking;House hold activities    Diagnostic tests US venous Img Upper Uni Lft report 11/20/2019: "IMPRESSION:Nonocclusive thrombus within the left brachial vein near theantecubital fossa" resolved per note on 5/27/2021CT Lumbar spine report 06/19/2020:"IMPRESSION:1. Healing right sacral ala fracture. This likely contributes to thepatient's right-sided hip pain.2. Dynamic listhesis with mild subarticular and foraminal stenosisbilaterally at L4-5. The stenosis is likely worse with standing andflexion.3. Mild right foraminal narrowing at L2-3.4. Broad-based disc protrusion at L2-3 and L3-4 extends into theforamina bilaterally both levels without other significant stenosis.5. Moderate left and mild right foraminal stenosis at L5-S1secondary to chronic broad-based disc protrusion and facethypertrophy. Mild left subarticular narrowing is present as well.6.  Aortic Atherosclerosis (ICD10-I70.0)."    Currently in Pain? Yes    Pain Score --   moderate   Pain Location Ankle    Pain Orientation Right;Left  TREATMENT: Recent DVT in left elbow region High fall risk! Cognitive deficits  Neuromuscular Re-education:to improve, balance, postural strength, muscle activation patterns, and stabilization strength required for functional activities, SBA - min A provided for all standing tasks for safey: - standing narrow stance head turns and nods 2x20 each side.  - standing foot taps on 8 inch stool 3x10 each side  Therapeutic exercise:to centralize symptoms and improve ROM, strength, muscular endurance, and activity tolerance required for successful  completion of functional activities. - sit <> stand from 18 inch chair with two pillows in chair with hands on knees or no UE support. 3x10  - Recumbent Bike level 5. Seat setting 15 For improved lower extremity ROM, muscular endurance, and activity tolerance; and to induce the analgesic effect of aerobic exercise, stimulate joint nutrition. 5 min to reinforce participation in long term HEP.   -education with patient and [name] about discharge intructions, HEP, [facility] referral, and walking in the community or stationary bike.   Pt required multimodal cuing for proper technique and to facilitate improved neuromuscular control, strength, range of motion, and functional ability resulting in improved performance and form.  HOME EXERCISE PROGRAM Access Code: [email or web address] URL: [email or web address] Date: 05/01/2020 Prepared by: Rosita Kea  Program Notes DO NOT DO THIS EXERCISE ALONE!! COMPLETE ONLY WITH HELP FROM FAMILY MEMBER/CARGIVER.   Exercises Romberg Stance with Head Rotation - 1 x daily - 2 sets - 20 reps Romberg Stance with Head Nods - 1 x daily - 2 sets - 20 reps Step Taps on High Step - 1 x daily - 3 sets - 10 reps Sit to Stand without Arm Support - 1 x daily - 3 sets - 10 reps    PT Short Term Goals - 05/08/20 1530      PT SHORT TERM GOAL #1   Title Patient/family indepndent with initial home exercise program for self-management of symptoms.    Baseline to be established as safe and appropriate visit 2 (02/08/2020); Patient requires step by step cuing to perform exercises safey with adequate guarding; family does not attend sessions, will continue to assess for ability to safely perform HEP (03/14/2020); initial HEP provided to patient and sister, sharon for her to assist him with (05/01/2020); sister has done some exercises with pt, but not the full HEP (05/08/2020);    Time 2    Period Weeks    Status Partially Met    Target Date 02/23/20             PT  Long Term Goals - 05/08/20 1531      PT LONG TERM GOAL #1   Title Family/patient is independent with a long-term home exercise program for self-management of symptoms.    Baseline to be established as safe and appropriate visit 2 (02/08/2020); Patient requires step by step cuing to perform exercises safey with adequate guarding; family does not attend sessions, will continue to assess for ability to safely perform HEP (03/14/2020); initial HEP provided to patient and sister, sharon for her to assist him with (05/01/2020); patient has been provided with long term HEP and referral to wellness center where he can use equipment with the assistance of his family members, sister did not try performing all of the HEP with patient over the last week since program was provided (05/08/2020);    Time 5    Period Weeks    Status Partially Met   TARGET DATE FOR ALL LONG TERM GOALS: 05/02/2020; Updated to 06/05/2020  PT LONG TERM GOAL #2   Title Patient will score equal or more than 25/30 on Functional Gait Assessment to demonstrate low fall risk.    Baseline to be tested visit 2 (02/08/2020); 14/30 (02/13/2020); 12/30 (03/14/2020); 14/30 (05/01/2020);    Time 5    Period Weeks    Status Not Met      PT LONG TERM GOAL #3   Title Patient will demonstrate the ability to complete sequential tapping steps on 8 inch step independently to demonstrate improved ability to navigate around objects without falling.    Baseline required support and repeated instructions (02/08/2020); able to complete with CGA and no loss of balance (03/14/2020): able to complete with increased sway and CGA (05/01/2020);    Time 5    Period Weeks    Status Partially Met      PT LONG TERM GOAL #4   Title Patient will demonstrated ability to complete Five Time Sit to Stand Test in equal or less than 12 seconds from 18.5 inch surface without UE support to demonstrate improved balance and safety with transfers.    Baseline 17 seconds from 18.5 inch  plinth, initial hand use. . (02/08/2020); 15 seconds from 18.5 inch plinth,No UE use. (03/14/2020; 05/01/2020);    Time 12    Period Weeks    Status Partially Met      PT LONG TERM GOAL #5   Title Patient will report less than 1 fall over 8 weeks to demonstrate improved safety with functional mobility.    Baseline at least 5 falls in the last 6 months (02/08/2020); reports no falls over the last 5 weeks (03/14/2020); one fall over 12 weeks (05/01/2020);    Time 12    Period Weeks    Status Achieved                 Plan - 05/08/20 1533    Clinical Impression Statement Patient has attended 20 physical therapy sessions this episode of care and is now being discharged due to lack of significant progress towards goals. Patient has severe cognitive deficits that require constant attendance and step by step cuing for reach exercise. The past few sessions have focused on teaching family members to complete HEP with patient that can be safely performed at home. He has been referred to a wellness center where he can use equipment for continued physical activity with the assistance of his family members. He has been unable to significantly improve his fall risk score on Functional Gait Analysis or Five Time Sit To Stand test. He has had one fall over the course of his care.  Patient is now discharged from PT to long term HEP with help of his family.    Personal Factors and Comorbidities Age;Time since onset of injury/illness/exacerbation;Behavior Pattern;Comorbidity 3+;Education;Social Background;Past/Current Experience;Fitness;Other    Comorbidities Relevant past medical history and comorbidities are extensive (see above) with particular relevance of cognitive impairment/decline, repeated L femur fracture, presence of AICD, low blood pressure, former smoker, MI with 5x bipass, ischemic cardiomyopathy, CHF, chronic Afib with recent need for cardioversion, neck pain, leg pain, DVT in left arm (recently cleared),  cellulitis of L LE (recently treated).    Examination-Activity Limitations Carry;Stairs;Sit;Reach Overhead;Locomotion Level;Stand;Lift;Dressing;Bend;Transfers;Other    Examination-Participation Restrictions Community Activity;Cleaning;Meal Prep;Medication Management;Driving;Shop;Yard Work    [employer] Evolving/Moderate complexity    Rehab Potential Fair    PT Frequency 2x / week    PT Duration 12 weeks    PT Treatment/Interventions ADLs/Self Care Home  Management;Cryotherapy;Canalith Repostioning;Moist Heat;Gait training;Stair training;Functional mobility training;Therapeutic activities;Therapeutic exercise;Balance training;Neuromuscular re-education;Cognitive remediation;Patient/family education;Manual techniques;Dry needling;Vestibular;Visual/perceptual remediation/compensation;Joint Manipulations;Spinal Manipulations    PT Next Visit Plan Patient is now discharged from PT due to lack of progress.    PT Home Exercise Plan Medbridge: Access Code: [email or web address]    Consulted and Agree with Plan of Care Patient;Family member/caregiver    Family Member Consulted sister, [name]           Patient will benefit from skilled therapeutic intervention in order to improve the following deficits and impairments:  Abnormal gait, Decreased cognition, Decreased knowledge of use of DME, Dizziness, Improper body mechanics, Pain, Impaired tone, Decreased coordination, Decreased mobility, Decreased activity tolerance, Decreased endurance, Difficulty walking, Decreased safety awareness, Decreased knowledge of precautions, Decreased balance  Visit Diagnosis: Difficulty in walking, not elsewhere classified  Unsteadiness on feet  Dizziness and giddiness     Problem List Patient Active Problem List   Diagnosis Date Noted  . Persistent atrial fibrillation ([place]) 11/30/2019  . Secondary hypercoagulable state ([place]) 11/30/2019  . Arm DVT (deep venous thromboembolism), acute, left ([place])  11/26/2019  . Hx of adenomatous colonic polyps 05/18/2019  . Cirrhosis, non-alcoholic ([place]) 03/88/8280  . Occult blood positive stool 05/12/2019  . HCAP (healthcare-associated pneumonia) 03/04/2019  . Hallucinations 02/10/2019  . Elevated blood sugar 01/27/2019  . S/P mitral valve repair 12/31/2018  . Bilateral pulmonary contusion 10/17/2018  . Cardiac LV ejection fraction of 20-34% 10/17/2018  . Chronic systolic CHF (congestive heart failure) ([place]) 10/17/2018  . History of ischemic cardiomyopathy 10/17/2018  . ICD (implantable cardioverter-defibrillator) in place 10/17/2018  . S/p left hip fracture 10/17/2018  . Traumatic retroperitoneal hematoma 10/17/2018  . Age-related osteoporosis with current pathological fracture with routine healing 07/28/2018  . CAD (coronary artery disease) 07/21/2018  . GERD (gastroesophageal reflux disease) 07/21/2018  . Depression 07/21/2018  . CKD (chronic kidney disease), stage III 07/21/2018  . Iron deficiency anemia 07/21/2018  . Hypokalemia 07/21/2018  . Closed left hip fracture ([place]) 07/21/2018  . Fracture of femoral neck, left ([place]) 07/21/2018  . Lower extremity pain, bilateral 11/12/2017  . 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 Hyperlipidemia 09/01/2017  . Essential hypertension 09/01/2017  . PAD (peripheral artery disease) ([place]) 09/01/2017  . Anemia, unspecified 08/06/2017  . GAD (generalized anxiety disorder) 06/18/2017  . Prostate cancer screening 04/28/2017  . Hydrocele 04/28/2017  . Bradycardia   . Premature ventricular contraction   . Severe mitral insufficiency   . Cardiomyopathy, ischemic 04/26/2015  . Difficulty hearing 12/29/2014  . Acute on chronic systolic CHF (congestive heart failure) ([place]) 10/10/2014  . 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 Protein-calorie malnutrition, severe ([place]) 07/29/2014  . AKI (acute kidney injury) ([place]) 07/29/2014    Everlean Alstrom. Graylon Good, PT, DPT 05/08/20, 3:36 PM  [facility] [address], [place], [address] Phone: [phone]   Fax:  [phone]  Name: Carl [name] MRN: [id number] Date of Birth: 07-25-1951

## 2020-05-09 ENCOUNTER — Ambulatory Visit: Payer: Medicare Other | Admitting: Physical Therapy

## 2020-05-11 ENCOUNTER — Other Ambulatory Visit (HOSPITAL_COMMUNITY): Payer: Self-pay | Admitting: Cardiology

## 2020-05-15 ENCOUNTER — Ambulatory Visit: Payer: Medicare Other | Admitting: Physical Therapy

## 2020-05-16 ENCOUNTER — Encounter (HOSPITAL_COMMUNITY): Payer: Self-pay

## 2020-05-16 ENCOUNTER — Other Ambulatory Visit (HOSPITAL_COMMUNITY): Payer: Self-pay

## 2020-05-16 NOTE — Progress Notes (Signed)
Today had a home visit with [name].  He had a fall shortly after my arrival.  He went to step up one step into his kitchen from his wash room and fell back hitting his butt first, it did not appear he hit his head.  He said immediately he was not hurt.  He appeared he was not watching where he stepped and could not catch his self before falling.  He has no marks, skin tears or bruising noted.  No loss of consciousness. He insisted to try to get up on his own but could not, I assisted him up to his feet and he was then able to stand then step up 2 steps and walk.  Advised his brother [name] what happened and suggested putting a rail there to prevent falls.  Mika also has not been going to his physical therapy session, he says his legs hurt today.  He had been saying they felt better.  He says he needs new shoes before he goes.  Talked to [name] about this also, [name] says he keeps coming up with excuses.  [place] talked a lot during my visit and appeared to be his normal.  He has all his medications and refilled 2 weeks of meds.  He is weighing daily and his weight has come down some.  He has no edema in lower extremities.  Abdomen does not feel tight and lungs are clear.  He denies any chest pain, shortness of breath, headaches or dizziness.  He says he ate cereal for breakfast, it appears he is taking his medications, did find a carvedilol and protonix left in his box.  Advised him to be careful not to leave any.  He has not been out as often with his sister out to eat.  He has everything for everyday living.  His brother [name] checks on him often.  Will continue to visit for heart failure, diet and medication management.   [place] [phone]

## 2020-05-21 ENCOUNTER — Encounter (HOSPITAL_COMMUNITY): Payer: Self-pay

## 2020-05-21 ENCOUNTER — Other Ambulatory Visit: Payer: Self-pay

## 2020-05-21 ENCOUNTER — Other Ambulatory Visit (HOSPITAL_COMMUNITY): Payer: Self-pay | Admitting: Adult Health

## 2020-05-21 ENCOUNTER — Ambulatory Visit (HOSPITAL_COMMUNITY)
Admission: RE | Admit: 2020-05-21 | Discharge: 2020-05-21 | Disposition: A | Discharge status: Home / Self Care | Payer: Medicare Other | Source: Ambulatory Visit | Attending: Cardiology | Admitting: Cardiology

## 2020-05-21 VITALS — BP 94/48 | HR 55 | Ht 72.0 in | Wt 167.8 lb

## 2020-05-21 DIAGNOSIS — N182 Chronic kidney disease, stage 2 (mild): Secondary | ICD-10-CM

## 2020-05-21 DIAGNOSIS — Z7901 Long term (current) use of anticoagulants: Secondary | ICD-10-CM | POA: Diagnosis not present

## 2020-05-21 DIAGNOSIS — Z8249 Family history of ischemic heart disease and other diseases of the circulatory system: Secondary | ICD-10-CM | POA: Diagnosis not present

## 2020-05-21 DIAGNOSIS — Z87891 Personal history of nicotine dependence: Secondary | ICD-10-CM | POA: Diagnosis not present

## 2020-05-21 DIAGNOSIS — I480 Paroxysmal atrial fibrillation: Secondary | ICD-10-CM | POA: Insufficient documentation

## 2020-05-21 DIAGNOSIS — Z951 Presence of aortocoronary bypass graft: Secondary | ICD-10-CM | POA: Insufficient documentation

## 2020-05-21 DIAGNOSIS — I5022 Chronic systolic (congestive) heart failure: Secondary | ICD-10-CM | POA: Insufficient documentation

## 2020-05-21 DIAGNOSIS — N183 Chronic kidney disease, stage 3 unspecified: Secondary | ICD-10-CM | POA: Diagnosis not present

## 2020-05-21 DIAGNOSIS — F70 Mild intellectual disabilities: Secondary | ICD-10-CM | POA: Diagnosis not present

## 2020-05-21 DIAGNOSIS — Z79899 Other long term (current) drug therapy: Secondary | ICD-10-CM | POA: Diagnosis not present

## 2020-05-21 DIAGNOSIS — I255 Ischemic cardiomyopathy: Secondary | ICD-10-CM | POA: Insufficient documentation

## 2020-05-21 DIAGNOSIS — I2510 Atherosclerotic heart disease of native coronary artery without angina pectoris: Secondary | ICD-10-CM | POA: Diagnosis not present

## 2020-05-21 DIAGNOSIS — K7460 Unspecified cirrhosis of liver: Secondary | ICD-10-CM | POA: Insufficient documentation

## 2020-05-21 DIAGNOSIS — Z95810 Presence of automatic (implantable) cardiac defibrillator: Secondary | ICD-10-CM | POA: Diagnosis not present

## 2020-05-21 DIAGNOSIS — M6281 Muscle weakness (generalized): Secondary | ICD-10-CM | POA: Insufficient documentation

## 2020-05-21 DIAGNOSIS — F329 Major depressive disorder, single episode, unspecified: Secondary | ICD-10-CM | POA: Insufficient documentation

## 2020-05-21 LAB — BASIC METABOLIC PANEL
Anion gap: 14 (ref 5–15)
BUN: 92 mg/dL — ABNORMAL HIGH (ref 8–23)
CO2: 26 mmol/L (ref 22–32)
Calcium: 9.5 mg/dL (ref 8.9–10.3)
Chloride: 85 mmol/L — ABNORMAL LOW (ref 98–111)
Creatinine, Ser: 3.62 mg/dL — ABNORMAL HIGH (ref 0.61–1.24)
GFR calc Af Amer: 19 mL/min — ABNORMAL LOW (ref >60)
GFR calc non Af Amer: 16 mL/min — ABNORMAL LOW (ref >60)
Glucose, Bld: 108 mg/dL — ABNORMAL HIGH (ref 70–99)
Potassium: 4.4 mmol/L (ref 3.5–5.1)
Sodium: 125 mmol/L — ABNORMAL LOW (ref 135–145)

## 2020-05-21 MED ORDER — CARVEDILOL 3.125 MG PO TABS: 3.1250 mg | ORAL_TABLET | Freq: Two times a day (BID) | ORAL | 3 refills | Status: DC

## 2020-05-21 MED ORDER — TORSEMIDE 20 MG PO TABS: 40.0000 mg | ORAL_TABLET | Freq: Every day | ORAL | 0 refills | Status: DC

## 2020-05-21 NOTE — Patient Instructions (Signed)
STOP Metolazone DECREASE Coreg to 3.125 mg, one tab twice a day  Labs today We will only contact you if something comes back abnormal or we need to make some changes. Otherwise no news is good news!  You have been referred to [facility]-for in home physical therapy -they will be in contact to arrange a home visit   Your physician recommends that you schedule a follow-up appointment in: 6-8 weeks  in the Advanced Practitioners (PA/NP) Clinic    If you have any questions or concerns before your next appointment please send [place] a message through [place] or call our office at [phone].    TO LEAVE A MESSAGE FOR THE NURSE SELECT OPTION 2, PLEASE LEAVE A MESSAGE INCLUDING: . YOUR NAME . DATE OF BIRTH . CALL BACK NUMBER . REASON FOR CALL**this is important as we prioritize the call backs  YOU WILL RECEIVE A CALL BACK THE SAME DAY AS LONG AS YOU CALL BEFORE 4:00 PM
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
## 2020-05-22 ENCOUNTER — Encounter: Payer: Medicare Other | Admitting: Physical Therapy

## 2020-05-22 ENCOUNTER — Other Ambulatory Visit (HOSPITAL_COMMUNITY): Payer: Self-pay

## 2020-05-22 ENCOUNTER — Encounter (HOSPITAL_COMMUNITY): Payer: Self-pay

## 2020-05-22 NOTE — Progress Notes (Signed)
Today had a home visit with Carl [name].  Received a call from Carl [name] with HF clinic in [place] to change Carl [name] medication as follows:  Stop eplerenone, potassium and metolazone.  Hold torsemide til Thurs Sept 30 and restart at 40 mg daily. Changed his coreg to 3.125 mg twice daily.  Carl [name] and brother Carl [name] knows he needs repeat blood work on Thursday at [facility] clinic in [place].  Carl [name] states did not feel good last night, vomited 3 times.  He states went out to eat with his daughter Carl [name], he states feels better this morning.  He states will have to quit going with her, he is upset with her.  Changed his medications and now he will only take morning and bedtime, explained to [place] and he appears to understand.  Advised his brother Carl [name] of same and to watch his pill boxes to make sure he is taking them.  Will watch for blood work on Thursday to see if any changes need to be made.  Carl [name] is not complaining of anything today.  Will continue to visit for heart failure and medication compliance.   [name] [phone]

## 2020-05-24 ENCOUNTER — Other Ambulatory Visit (HOSPITAL_COMMUNITY): Payer: Self-pay | Admitting: Cardiology

## 2020-05-24 ENCOUNTER — Ambulatory Visit (HOSPITAL_COMMUNITY)
Admission: RE | Admit: 2020-05-24 | Discharge: 2020-05-24 | Disposition: A | Discharge status: Home / Self Care | Payer: Medicare Other | Source: Ambulatory Visit | Attending: Cardiology | Admitting: Cardiology

## 2020-05-24 ENCOUNTER — Other Ambulatory Visit: Payer: Self-pay

## 2020-05-24 DIAGNOSIS — I5022 Chronic systolic (congestive) heart failure: Secondary | ICD-10-CM | POA: Insufficient documentation

## 2020-05-24 LAB — BASIC METABOLIC PANEL
Anion gap: 9 (ref 5–15)
BUN: 60 mg/dL — ABNORMAL HIGH (ref 8–23)
CO2: 27 mmol/L (ref 22–32)
Calcium: 9.2 mg/dL (ref 8.9–10.3)
Chloride: 91 mmol/L — ABNORMAL LOW (ref 98–111)
Creatinine, Ser: 2.65 mg/dL — ABNORMAL HIGH (ref 0.61–1.24)
GFR calc Af Amer: 27 mL/min — ABNORMAL LOW (ref >60)
GFR calc non Af Amer: 24 mL/min — ABNORMAL LOW (ref >60)
Glucose, Bld: 150 mg/dL — ABNORMAL HIGH (ref 70–99)
Potassium: 3.4 mmol/L — ABNORMAL LOW (ref 3.5–5.1)
Sodium: 127 mmol/L — ABNORMAL LOW (ref 135–145)

## 2020-05-24 NOTE — Addendum Note (Signed)
Encounter addended by: Shonna Chock, CMA on: 11/03/8116 9:29 AM  Actions taken: Letter saved

## 2020-05-25 ENCOUNTER — Telehealth (HOSPITAL_COMMUNITY): Payer: Self-pay

## 2020-05-25 NOTE — Telephone Encounter (Signed)
Malena Edman, RN  05/25/2020 3:23 PM EDT Back to Top    Patient and brother advised and verbalized understanding   Malena Edman, RN  05/24/2020 4:50 PM EDT     Attempted to call pt, no answer, unable to leave message

## 2020-05-25 NOTE — Telephone Encounter (Signed)
-----   Message from [name], MD sent at 05/24/2020  3:52 PM EDT ----- Creatinine better, sodium is low.  Limit po fluid intake to < 2L/day.

## 2020-05-28 ENCOUNTER — Telehealth (HOSPITAL_COMMUNITY): Payer: Self-pay

## 2020-05-28 ENCOUNTER — Other Ambulatory Visit (HOSPITAL_COMMUNITY): Payer: Self-pay | Admitting: Cardiology

## 2020-05-28 ENCOUNTER — Ambulatory Visit (INDEPENDENT_AMBULATORY_CARE_PROVIDER_SITE_OTHER): Payer: Medicare Other

## 2020-05-28 DIAGNOSIS — I5022 Chronic systolic (congestive) heart failure: Secondary | ICD-10-CM | POA: Diagnosis not present

## 2020-05-28 DIAGNOSIS — Z95810 Presence of automatic (implantable) cardiac defibrillator: Secondary | ICD-10-CM

## 2020-05-28 NOTE — Progress Notes (Signed)
EPIC Encounter for ICM Monitoring  Patient Name: Carl [name] is a 68 y.o. male Date: 05/28/2020 Primary Care Physican: [name], MD Primary Cardiologist:[place] Electrophysiologist: Rayann Heman 9/28/2021Office Weight: 165lbs  Time in AF0.0hr/day (0.0%)  Spoke with brother [name] and he reports patient is feeling fine at this time.   Pt does not adhere to low salt diet.  OptivolThoracic impedance suggesting possible fluid accumulation since 05/21/2020 which correlates with discontinuing of Metolazone and decrease in Torsemide.  Prescribed:  Torsemide20mg take 2 tablets(40mg  total)by mouth daily.  Labs: 05/24/2020 Creatinine 2.65, BUN 60, Potassium 3.4, Sodium 137, GFR 24-27 05/21/2020 Creatinine 3.62, BUN 92, Potassium 4.4, Sodium 125, GFR 16-19  04/26/2020 Creatinine 2.60, BUN 51, Potassium 4.0, Sodium 130, GFR 24-28 04/20/2020 Creatinine 2.75, BUN 43, Potassium 3.8, Sodium 129, GFR 23-26 01/27/2020 Creatinine 2.2, BUN 32, Potassium 4.0, Sodium 134, GFR 30 [facility]) 01/12/2020 Creatinine2.00, BUN64, Potassium3.6, Sodium129, [id number] 12/21/2019 Creatinine2.04, BUN31, Potassium4.6, Sodium131, [id number]  12/19/2019 Creatinine2.04, BUN35, Potassium4.2, [id number], [id number] 11/30/2019 Creatinine 1.87, BUN 38, Potassium 4.6, Sodium 131, GFR 36-42 A complete set of results can be found in Results Review  Recommendations:Copy sent to Dr [name] for review and recommendations if needed.  Follow-up plan: ICM clinic phone appointment on10/07/2020 to recheck fluid levels. 91 day device clinic remote transmission12/09/2019.  EP/Cardiology Office Visits: 07/09/2020 with Advanced HF clinic   Copy of ICM check sent to Dr.Allred.    3 month ICM trend: 05/28/2020    1 Year ICM trend:       Rosalene Billings, RN 05/28/2020 2:12 PM

## 2020-05-28 NOTE — Telephone Encounter (Signed)
Patient's brother [name] [facility]) made aware and verbalized understanding. Referral placed

## 2020-05-28 NOTE — Telephone Encounter (Signed)
-----   Message from Conrad Cayce, NP sent at 05/28/2020  1:11 PM EDT ----- Regarding: Palliative Care Referral Please refer to Palliative Care , [facility]   Diagnosis, Heart Failure,   Amy Clegg NP-C  1:12 PM

## 2020-05-30 ENCOUNTER — Telehealth: Payer: Self-pay | Admitting: Podiatry

## 2020-05-30 ENCOUNTER — Encounter: Payer: Medicare Other | Admitting: Physical Therapy

## 2020-05-30 ENCOUNTER — Other Ambulatory Visit (HOSPITAL_COMMUNITY): Payer: Self-pay | Admitting: Cardiology

## 2020-05-30 MED ORDER — METOLAZONE 2.5 MG PO TABS: 2.5000 mg | ORAL_TABLET | ORAL | 3 refills | Status: DC

## 2020-05-30 MED ORDER — POTASSIUM CHLORIDE CRYS ER 20 MEQ PO TBCR: EXTENDED_RELEASE_TABLET | ORAL | 3 refills | Status: DC

## 2020-05-30 NOTE — Progress Notes (Signed)
Restart metolazone 2.5 once weekly with extra KCl 20 on metolazone days.  Can take tomorrow with am torsemide.  Needs BMET next week.

## 2020-05-30 NOTE — Telephone Encounter (Signed)
pts brother left message 10/4 stating pt is in need of diabetic shoes and has a leg length discrepancy.  I returned call to brother and pt is scheduled to see Dr Prudence Davidson this Friday 10.7 and I explained that we do not have equipment in our office to cut the sole and add to but we could get pt shoes and then give rx to [facility] to get the leg length discrepancy corrected. I tried to explain that pt has to see the doctor to make sure he qualifies for diabetic shoes and I have scheduled the pt to see Rick on 10.20.in [place] as well. Pts brother stated they use to go to [facility] to get the shoes but they have closed.

## 2020-05-30 NOTE — Progress Notes (Signed)
Spoke with Carl [name], EMT paramedicine program and advised of Dr Claris Gladden recommendation to restarting metolazone 2.5 once weekly with extra KCl 20 on metolazone days.  Can take tomorrow with am torsemide. Advised will contact brother to schedule an appointment for BMET next week.  She will go to patients house tomorrow to set up Metolazone and potassium as recommended.

## 2020-05-31 ENCOUNTER — Encounter: Payer: Self-pay | Admitting: Podiatry

## 2020-05-31 ENCOUNTER — Other Ambulatory Visit (HOSPITAL_COMMUNITY): Payer: Self-pay

## 2020-05-31 ENCOUNTER — Other Ambulatory Visit: Payer: Self-pay

## 2020-05-31 ENCOUNTER — Ambulatory Visit (INDEPENDENT_AMBULATORY_CARE_PROVIDER_SITE_OTHER): Payer: Medicare Other | Admitting: Podiatry

## 2020-05-31 DIAGNOSIS — M2170 Unequal limb length (acquired), unspecified site: Secondary | ICD-10-CM | POA: Insufficient documentation

## 2020-05-31 NOTE — Progress Notes (Signed)
Received a phone call from [name] yesterday evening stating Carl [name] is showing fluid, per Mcclean to restart metolazone and 1 potassium once weekly.  Was able to add metolazone and potassium to take once this week.  Will make a home visit next week to change rest of med boxes and do a full visit.   [place] [phone]

## 2020-05-31 NOTE — Progress Notes (Signed)
This patient returns to my office for at risk foot care.  This patient requires this care by a professional since this patient will be at risk due to having coagulation defect.   This patient requests a pair of shoes due to unequal limb length.  He has history of fractures which has led to his limb length.  He presents to the office with his brother.  His brother says he has 3/4 inch limb length.  General Appearance  Alert, conversant and in no acute stress.  Vascular  Dorsalis pedis and posterior tibial  pulses are not  palpable  bilaterally.  Capillary return is within normal limits  bilaterally. Temperature is within normal limits  bilaterally.  Neurologic  Senn-Weinstein monofilament wire test within normal limits  bilaterally. Muscle power within normal limits bilaterally.  Nails Thick disfigured discolored nails with subungual debris  from hallux to fifth toes bilaterally. No evidence of bacterial infection or drainage bilaterally.  Orthopedic  No limitations of motion  feet .  No crepitus or effusions noted.  No bony pathology or digital deformities noted. 1/2 inch limb length.  Skin  normotropic skin with no porokeratosis noted bilaterally.  No signs of infections or ulcers noted.     Limb length discrepancy left short.  Consent was obtained for treatment procedures.   Discussed acquiring new shoes for his brother.  The brother is to call me with Christia Reading phone number who is the individual who applies lifts to the soles of the shoe.  He is also told to have Tobe make an appointment with Liliane Channel so he can measure his feet and confirm the limb length discrepancy amounts.  The brother was told that that his brother does not qualify for diabetic shoes and he would be responsible for paying for the shoe.  I was hoping that they could pick out an appropriate shoe and then contact Staci Righter to apply the left to the sole of the left shoe.  This would help this patient.   Gardiner Barefoot  DPM      Return office visit   3 months                  Told patient to return for periodic foot care and evaluation due to potential at risk complications.   Gardiner Barefoot DPM

## 2020-05-31 NOTE — Progress Notes (Signed)
Spoke with brother and advised of Dr Claris Gladden recommendations to restart Metolazone and Potassium.  He has spoken to [name], EMT paramedicine program this morning and received instructions regarding the med changes.  Advised labs needed in a week and scheduled for 06/08/2020 at 3:45 PM.  Discussed limiting salt intake.

## 2020-06-01 ENCOUNTER — Telehealth: Payer: Self-pay | Admitting: Nurse Practitioner

## 2020-06-01 NOTE — Telephone Encounter (Signed)
Spoke with patient's brother, Josue Falconi, regarding the Palliative referral and he was in agreement with scheduling visit.  [name] requested a later afternoon appointment around 3 due to his work and he wanted to be there for the Consult.  Right now the NP doesn't have anything available until the end of the month and I told him that I would need to speak with her to see when we could get him worked in and that I would call him back after speaking with NP and he was in agreement with this.

## 2020-06-04 ENCOUNTER — Telehealth: Payer: Self-pay | Admitting: Nurse Practitioner

## 2020-06-04 NOTE — Telephone Encounter (Signed)
Spoke with patient's brother, [name], and let him know that the NP could see the patient this Thurs. 06/07/20 @ 3 PM and he said this would work for him.  Scheduled an [place] for this day.

## 2020-06-05 ENCOUNTER — Ambulatory Visit (INDEPENDENT_AMBULATORY_CARE_PROVIDER_SITE_OTHER): Payer: Medicare Other

## 2020-06-05 DIAGNOSIS — I5022 Chronic systolic (congestive) heart failure: Secondary | ICD-10-CM

## 2020-06-05 DIAGNOSIS — Z95810 Presence of automatic (implantable) cardiac defibrillator: Secondary | ICD-10-CM

## 2020-06-05 NOTE — Progress Notes (Signed)
EPIC Encounter for ICM Monitoring  Patient Name: Carl [name] is a 69 y.o. male Date: 06/05/2020 Primary Care Physican: [name], MD Primary [place] Electrophysiologist: Allred 9/28/2021OfficeWeight: 165lbs  Time in AF0.0hr/day (0.0%)  Message received from [name], EMT paramedicine program.  She reports she spoke with brother this AM and patient's legs are swollen.  He is taking Metolazone on Fridays.   OptivolThoracic impedancesuggesting possible fluid accumulation since 05/21/2020 but improving since restarting Metolazone on 06/01/2020.  Prescribed:  Torsemide20mg take2 tablets(40mg  total)by mouth daily.  Metolazone 2.5 mg Take 1 tablet (2.5 mg total) by mouth once a week. Take 20 mEq Potassium with Metolazone.  Potassium 20 mEq Take 1 tablet (20 mEq total) on Metolazone days.  Labs:  BMET scheduled for 06/08/2020 05/24/2020 Creatinine 2.65, BUN 60, Potassium 3.4, Sodium 137, GFR 24-27 05/21/2020 Creatinine 3.62, BUN 92, Potassium 4.4, Sodium 125, GFR 16-19  04/26/2020 Creatinine 2.60, BUN 51, Potassium 4.0, Sodium 130, GFR 24-28 04/20/2020 Creatinine 2.75, BUN 43, Potassium 3.8, Sodium 129, GFR 23-26 01/27/2020 Creatinine 2.2, BUN 32, Potassium 4.0, Sodium 134, GFR 30 [facility]) 01/12/2020 Creatinine2.00, BUN64, Potassium3.6, Sodium129, [id number] 12/21/2019 Creatinine2.04, BUN31, Potassium4.6, Sodium131, [id number]  12/19/2019 Creatinine2.04, BUN35, Potassium4.2, ORVIFB379, [id number] 11/30/2019 Creatinine 1.87, BUN 38, Potassium 4.6, Sodium 131, GFR 36-42 A complete set of results can be found in Results Review  Recommendations:Copy sent to Dr [name] for review and recommendations if needed.  Follow-up plan: ICM clinic phone appointment on10/25/2021 to recheck fluid levels. 91 day device clinic remote transmission12/09/2019.  EP/Cardiology Office Visits: 07/09/2020 with[facility]  Copy of ICM check sent to Dr.Allred.    3 month ICM trend: 06/05/2020    1 Year ICM trend:       Rosalene Billings, RN 06/05/2020 8:28 AM

## 2020-06-06 ENCOUNTER — Telehealth (HOSPITAL_COMMUNITY): Payer: Self-pay | Admitting: *Deleted

## 2020-06-06 MED ORDER — POTASSIUM CHLORIDE CRYS ER 20 MEQ PO TBCR: 20.0000 meq | EXTENDED_RELEASE_TABLET | Freq: Every day | ORAL | 3 refills | Status: DC

## 2020-06-06 MED ORDER — TORSEMIDE 20 MG PO TABS
80.0000 mg | ORAL_TABLET | Freq: Every day | ORAL | 1 refills | Status: DC
Start: 2020-06-06 — End: 2020-06-18

## 2020-06-06 NOTE — Telephone Encounter (Signed)
Kristi w/paramedicine called to report pt was swollen and not feeling well since decrease in torsemide. Per Dr.McLean increase torsemide to 80mg  daily and take k 20meq daily.  Kristi aware.

## 2020-06-07 ENCOUNTER — Other Ambulatory Visit: Payer: Self-pay

## 2020-06-07 ENCOUNTER — Encounter: Payer: Self-pay | Admitting: Nurse Practitioner

## 2020-06-07 ENCOUNTER — Encounter (HOSPITAL_COMMUNITY): Payer: Self-pay

## 2020-06-07 ENCOUNTER — Other Ambulatory Visit: Payer: Medicare Other | Admitting: Nurse Practitioner

## 2020-06-07 ENCOUNTER — Other Ambulatory Visit (HOSPITAL_COMMUNITY): Payer: Self-pay

## 2020-06-07 DIAGNOSIS — Z515 Encounter for palliative care: Secondary | ICD-10-CM

## 2020-06-07 NOTE — Progress Notes (Signed)
[facility] [facility] Palliative Care Consult Note Telephone: [phone]  Fax: [phone]  PATIENT NAME: Carl [name] DOB: 18-Mar-1951 MRN: [id number]  PRIMARY CARE PROVIDER:   [name], MD  REFERRING PROVIDER:  [name], MD [address] [facility] [place],  [place] [address]  RESPONSIBLE PARTY:   Self; Paola Flynt brother [phone]  I was asked to see Carl [name] for Palliative care consult for goals of care by Dr Ginette Pitman  1. Advance Care Planning; DNR, placed in vynca; Blank MOST form and Hard Choice book left for further review, discussion at next [facility] visit  2. Goals of Care: Goals include to maximize quality of life and symptom management. Our advance care planning conversation included a discussion about:     The value and importance of advance care planning   Exploration of personal, cultural or spiritual beliefs that might influence medical decisions   Exploration of goals of care in the event of a sudden injury or illness   Identification and preparation of a healthcare agent   Review and updating or creation of an  advance directive document.  3. Palliative care encounter; Palliative care encounter; Palliative medicine team will continue to support patient, patient's family, and medical team. Visit consisted of counseling and education dealing with the complex and emotionally intense issues of symptom management and palliative care in the setting of serious and potentially life-threatening illness  4. f/u 4 weeks for ongoing monitoring disease progression, appetite, edema, ongoing discussion GOC, revisit MOST form  I spent 90 minutes providing this consultation,  from 3:00pm to 4:30pm. More than 50% of the time in this consultation was spent coordinating communication.   HISTORY OF PRESENT ILLNESS:  Carl [name] is a 69 y.o. year old male with multiple medical problems including Ischemic cardiomyopathy, automatic  cardioverter-defibrillator, Congestive heart failure, coronary artery disease s/p CABG, atrial fibrillation, myocardial infarction, history of non-alcoholic cirrhosis,  chronic kidney disease, peripheral artery disease, hypertension,  history of DVT , schatzki rings, peptic ulcer disease, hyperlipidemia, tubular adenoma of colon, vitiligo, hiatal hernia, history of traumatic retroperitoneal hematoma, osteoporosis, hearing loss, gerd, arthritis, anemia, severe protein calorie malnutrition, anxiety, depression, Brunner's gland hyperplasia of duodenum, 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 He is followed by [facility] paramedic Carl [name] did have a visit today changing his chores to my dose and adding potassium. Refill medication box productive meditation talked about diet, weight, dizziness as he does have a cold. Carl [name] did have a visit 10/13 / 2021 with Dr. Ginette Pitman Primary Care where increased Torsemide to 40mg  bid and currently on Metolazone 2.5 mg weekly. Systolic congestive heart failure with atrial fibrillation taking Carvedilol and losartan. EF 25% with severe Mr. Chronic kidney disease stage 4 with GF are 25 and creatinine 2.6. History of left hip fracture s/p ORIF currently taking Tylenol for pain and ambulates with a cane. Initial in person palliative care consult with Carl [name] and his brother Carl [name]. We talked about purpose of palliative care. Carl [name] and Carl [name] both in agreement. We talked about how he was feeling today. Carl [name] endorses he is doing well. Carl [name] feels like he has had a good day. Carl [name] endorses he does have pain in his feet intermittently but he does elevate them in addition to wearing long socks. We talked about edema and diet, low sodium. We talked about recommendation  of dietitian, [name] and Carl [name] both in agreement. We talked about past medical history in  the same chronic disease and progression of congestive heart failure, ischemic cardiomyopathy and EF of 25%. We talked about Carl [name] understanding of his heart disease. Carl [name] endorses that his heart is very weak. We talked about his energy level. We talked about fatigue, weakness overall. Carl [name] does walk with a cane. Carl [name] endorses he had a walker but he fell in the driveway as his driveway is gravel unable to roll a walker. We talked about symptoms of shortness of breath which he denies. We talked about his appetite and food choices revisiting the dietitian consult. Carl [name] and endorses he gets most of his food from people that bring it to him from restaurants. We talked about different food options and cooking, how to read sodium labels. We talked about healthier food. We talked about medical goals of care including aggressive versus conservative versus comfort care. We talked about code status as he currently is a full code. [name] endorses he wishes to be a DNR. [place] form completed and will leave two copies left in the home. We reviewed MOST form and Hard Choice book will leave for further review. Discussion at next palliative care visit. We talked about different scenarios with MOST form. We talked about role of palliative care in plan of care. We talked about Life review as he has two children a daughter that helps Carl [name] frequently. Carl [name] endorses his son does not come around. We talked about role of palliative care and plan of care. Discussed will follow up in for weeks, ongoing monitoring of symptoms including appetite, edema, weakness and revisit nutrition, most form. Appointment scheduled. Therapeutic listening and emotional support provided. Contact information. Questions answered to satisfaction.  Palliative Care was asked to help address goals of care.   CODE STATUS: DNR  PPS: 50% HOSPICE ELIGIBILITY/DIAGNOSIS: TBD  PAST MEDICAL HISTORY:  Past Medical History:    Diagnosis Date  . AICD (automatic cardioverter/defibrillator) present   . Anemia   . Anxiety   . Arthritis   . Brunner's gland hyperplasia of duodenum   . CHF (congestive heart failure) ([place])   . Coronary artery disease   . External hemorrhoids   . Fracture of femoral neck, left ([place]) 07/21/2018  . GERD (gastroesophageal reflux disease)   . Hearing loss   . HH (hiatus hernia)   . Internal hemorrhoids   . Ischemic cardiomyopathy   . Leg fracture, left   . Myocardial infarction ([place])   . Paronychia   . Pneumonia   . Psoriasis   . PUD (peptic ulcer disease)   . Schatzki's ring   . Tubular adenoma of colon   . Vitiligo     SOCIAL HX:  Social History   Tobacco Use  . Smoking status: Former Smoker    Quit date: 2014    Years since quitting: 7.7  . Smokeless tobacco: Never Used  . Tobacco comment: 07/30/2017 Quit in 2014  Substance Use Topics  . Alcohol use: No    Alcohol/week: 0.0 standard drinks    ALLERGIES:  Allergies  Allergen Reactions  . Spironolactone Other (See Comments)    gynecomastia      PERTINENT MEDICATIONS:  Outpatient Encounter Medications as of 06/07/2020  Medication Sig  . acetaminophen (TYLENOL) 500 MG tablet Take 500-1,000 mg by mouth every 6 (six) hours as needed (for pain.).  Marland Kitchen allopurinol (ZYLOPRIM) 100 MG tablet Take 100  mg by mouth daily.   Marland Kitchen amiodarone (PACERONE) 200 MG tablet Take 1 tablet (200 mg total) by mouth daily.  Marland Kitchen apixaban (ELIQUIS) 5 MG TABS tablet Take 1 tablet (5 mg total) by mouth 2 (two) times daily.  . Ascorbic Acid (VITAMIN C) 100 MG tablet Take 100 mg by mouth daily.  Marland Kitchen atorvastatin (LIPITOR) 40 MG tablet TAKE 1 TABLET (40 MG TOTAL) BY MOUTH DAILY AT 6 PM. (Patient taking differently: Take 40 mg by mouth at bedtime. )  . carvedilol (COREG) 3.125 MG tablet Take 1 tablet (3.125 mg total) by mouth 2 (two) times daily.  . cephALEXin (KEFLEX) 500 MG capsule Take 500 mg by mouth 3 (three) times daily.  (Patient not taking:  Reported on 06/07/2020)  . Cholecalciferol (VITAMIN D3 SUPER STRENGTH) 50 MCG (2000 UT) TABS Take 2,000 Units by mouth at bedtime.   . CVS D3 50 MCG (2000 UT) CAPS Take 1 capsule by mouth daily. (Patient not taking: Reported on 06/07/2020)  . cyanocobalamin 500 MCG tablet Take 500 mcg by mouth at bedtime.   . digoxin (LANOXIN) 0.125 MG tablet Take 125 mcg by mouth daily. (Patient not taking: Reported on 06/07/2020)  . DULoxetine (CYMBALTA) 30 MG capsule Take 30 mg by mouth every evening.   . ferrous sulfate 325 (65 FE) MG tablet Take 1 tablet (325 mg total) by mouth daily with breakfast. (Patient taking differently: Take 325 mg by mouth daily. )  . gabapentin (NEURONTIN) 300 MG capsule Take 300 mg by mouth at bedtime.   Marland Kitchen ketoconazole (NIZORAL) 2 % shampoo Apply 1 application topically 2 (two) times a week.   . metolazone (ZAROXOLYN) 2.5 MG tablet Take 1 tablet (2.5 mg total) by mouth once a week. Take 20 mEq Potassium with Metolazone.  . Multiple Vitamin (MULTIVITAMIN) tablet Take 1 tablet by mouth at bedtime.   . pantoprazole (PROTONIX) 40 MG tablet TAKE 1 TABLET BY MOUTH EVERY DAY  . potassium chloride SA (KLOR-CON M20) 20 MEQ tablet Take 1 tablet (20 mEq total) by mouth daily.  Marland Kitchen torsemide (DEMADEX) 20 MG tablet Take 4 tablets (80 mg total) by mouth daily.   No facility-administered encounter medications on file as of 06/07/2020.    PHYSICAL EXAM:   General: NAD, frail appearing, thin, pleasant male Cardiovascular: regular rate and rhythm Pulmonary: clear ant fields Extremities: +BLE edema, no joint deformities Neurological: walks with cane, generalized weakness  Zakye Baby Ihor Gully, NP

## 2020-06-07 NOTE — Progress Notes (Signed)
Today had a home visit with Carl [name].  Carl [name] had some med changes due to fluid.  Changed his torsemide dose and added potassium.  Carl [name] has all his medications and refilled his med [name].  It appears Carl [name] has been taking his medications.  We have him taking medications twice daily which helps him not to be as confused taking them.  Carl [name] is taking them more consistent now.  Carl [name] has swelling in lower legs, has sock lines.  Advised him to elevate legs and watch what Carl [name] eats and drinks.  His brother has been trying to get him to eat healthier items.  Carl [name] in his living room due to Carl [name] has washer and dryer in kitchen and his iron pills are swelling from humidity when I place them in his box.  Advised his brother same.  Carl [name] is weighing daily and writes it down.  His weight is up.  Carl [name] denies shortness of breath, headaches, dizziness or chest pain.  Carl [name] seen his PCP yesterday.  Carl [name] states has a cold with hoarseness when Carl [name] talks today.  Carl [name] states feeling ok other than his legs hurt.  Carl [name] is eating cough drops.  Carl [name] states its the air condition and goes away later in day.  Carl [name] has appt with Palliative care today at home, his brother will attend also.  Will visit for heart failure, diet and medication compliance.  [place] [phone]

## 2020-06-08 ENCOUNTER — Other Ambulatory Visit (HOSPITAL_COMMUNITY): Payer: Medicare Other

## 2020-06-11 NOTE — Progress Notes (Signed)
Per phone 06/06/2020 message:   Harvie Junior, [place]   06/06/20 2:20 PM Note Kristi w/paramedicine called to report pt was swollen and not feeling well since decrease in torsemide. Per Dr.McLean increase torsemide to 80mg  daily and take k 84meq daily.  Kristi aware.

## 2020-06-13 ENCOUNTER — Other Ambulatory Visit: Payer: Self-pay

## 2020-06-13 ENCOUNTER — Ambulatory Visit: Payer: Medicare Other | Admitting: Orthotics

## 2020-06-13 DIAGNOSIS — B351 Tinea unguium: Secondary | ICD-10-CM

## 2020-06-13 DIAGNOSIS — M2170 Unequal limb length (acquired), unspecified site: Secondary | ICD-10-CM

## 2020-06-13 DIAGNOSIS — D689 Coagulation defect, unspecified: Secondary | ICD-10-CM

## 2020-06-13 DIAGNOSIS — M79675 Pain in left toe(s): Secondary | ICD-10-CM

## 2020-06-13 NOTE — Progress Notes (Signed)
Patient will get [employer] shoes off site; gave rx for [employer] to add 3/4 lift left.

## 2020-06-14 ENCOUNTER — Ambulatory Visit (HOSPITAL_COMMUNITY)
Admission: RE | Admit: 2020-06-14 | Discharge: 2020-06-14 | Disposition: A | Discharge status: Home / Self Care | Payer: Medicare Other | Source: Ambulatory Visit | Attending: Adult Health | Admitting: Adult Health

## 2020-06-14 DIAGNOSIS — I5022 Chronic systolic (congestive) heart failure: Secondary | ICD-10-CM

## 2020-06-14 LAB — BASIC METABOLIC PANEL
Anion gap: 15 (ref 5–15)
BUN: 49 mg/dL — ABNORMAL HIGH (ref 8–23)
CO2: 27 mmol/L (ref 22–32)
Calcium: 9.5 mg/dL (ref 8.9–10.3)
Chloride: 86 mmol/L — ABNORMAL LOW (ref 98–111)
Creatinine, Ser: 2.93 mg/dL — ABNORMAL HIGH (ref 0.61–1.24)
GFR, Estimated: 23 mL/min — ABNORMAL LOW (ref >60)
Glucose, Bld: 105 mg/dL — ABNORMAL HIGH (ref 70–99)
Potassium: 3.8 mmol/L (ref 3.5–5.1)
Sodium: 128 mmol/L — ABNORMAL LOW (ref 135–145)

## 2020-06-18 ENCOUNTER — Other Ambulatory Visit (HOSPITAL_COMMUNITY): Payer: Self-pay | Admitting: Adult Health

## 2020-06-18 ENCOUNTER — Ambulatory Visit (INDEPENDENT_AMBULATORY_CARE_PROVIDER_SITE_OTHER): Payer: Medicare Other

## 2020-06-18 DIAGNOSIS — I5022 Chronic systolic (congestive) heart failure: Secondary | ICD-10-CM

## 2020-06-18 DIAGNOSIS — Z95810 Presence of automatic (implantable) cardiac defibrillator: Secondary | ICD-10-CM

## 2020-06-19 ENCOUNTER — Encounter (HOSPITAL_COMMUNITY): Payer: Self-pay

## 2020-06-19 ENCOUNTER — Telehealth: Payer: Self-pay

## 2020-06-19 ENCOUNTER — Other Ambulatory Visit (HOSPITAL_COMMUNITY): Payer: Self-pay

## 2020-06-19 NOTE — Progress Notes (Signed)
EPIC Encounter for ICM Monitoring  Patient Name: Carl [name] is a 69 y.o. male Date: 06/19/2020 Primary Care Physican: [name], MD Primary Cardiologist:McLean Electrophysiologist: Allred 10/26/2021OfficeWeight: 169lbs  Time in AF0.0hr/day (0.0%)  Spoke with [name], EMT paramedicine program. She visited patient today, 06/19/2020.   She reports patient's legs remain swollen and described as 1+edema.  He is taking Metolazone on Fridays.  He is receiving meals on wheels for lunch but are not low sodium meals.  She stated it is possible he may be drinking more than 64 oz of fluid.    OptivolThoracic impedancesuggesting possible fluid accumulation since 05/21/2020 but improving since restarting Metolazone on 06/01/2020.  Prescribed:  Torsemide20mg take2 tablets(40mg  total)by mouthdaily.  Metolazone 2.5 mg Take 1 tablet (2.5 mg total) by mouth once a week (taking on Fridays). Take 20 mEq Potassium with Metolazone.  Potassium 20 mEq Take 1 tablet (20 mEq total) by mouth daily.  Also take extra 20 mEq on Metolazone days.  Labs:   06/14/2020 Creatinine 2.93, BUN 49, Potassium 3.8, Sodium 128, GFR 23 05/24/2020 Creatinine2.65, BUN60, Potassium3.4, [id number], [id number] 05/21/2020 Creatinine3.62, BUN92, Potassium4.4, [id number], PQZ30-07 04/26/2020 Creatinine 2.60, BUN 51, Potassium 4.0, Sodium 130, GFR 24-28 04/20/2020 Creatinine 2.75, BUN 43, Potassium 3.8, Sodium 129, GFR 23-26 01/27/2020 Creatinine 2.2, BUN 32, Potassium 4.0, Sodium 134, GFR 30 [facility]) 01/12/2020 Creatinine2.00, BUN64, Potassium3.6, Sodium129, [id number] 12/21/2019 Creatinine2.04, BUN31, Potassium4.6, Sodium131, LKT62-56  12/19/2019 Creatinine2.04, BUN35, Potassium4.2, [id number], JGO11-57 11/30/2019 Creatinine 1.87, BUN 38, Potassium 4.6, Sodium 131, GFR 36-42 A complete set of results can be found in Results  Review  Recommendations:Copy sent to Dr [name] for review and recommendations if needed.  Follow-up plan: ICM clinic phone appointment on11/1/2021to recheck fluid levels. 91 day device clinic remote transmission12/09/2019.  EP/Cardiology Office Visits: 07/09/2020 with[facility]  Copy of ICM check sent to Dr.Allred.   3 month ICM trend: 06/19/2020    1 Year ICM trend:       Rosalene Billings, RN 06/19/2020 5:03 PM

## 2020-06-19 NOTE — Telephone Encounter (Signed)
Attempted call to [name], EMT Paramedicine program.  Left message regarding patient's remote transmission results. Advised will send report to Dr [name] for review and will call her back with any recommendations.

## 2020-06-19 NOTE — Progress Notes (Signed)
Today had a home visit with Carl [name].  He states his legs are swollen and hurt.  He has some sock lines, plus 1 edema.  He is sitting in chair with legs down when arrived.  He tries to sit in recliner with 2 to 3 pillows at his back and not sitting back in the chair.  Got him to stand and take the pillows out and sit all the way back in the recliner.  Then he was able to pull the lever and raise the foot piece.  He laid back and was able to get comfortable in chair with legs at heart level.  Advised him he needs to sit in the chair this way without pillows.  He states he puts pillows under his feet, afraid if he does he will trip over them when he lowers it.  Carl [name] appears more confused today.  He is taking meds on Wednesday today, he has got off in his pil box.  Contacted Carl [name] and advised him and he states he had dropped some pills and he thought he had it back right.  Carl [name] advises he does not have any pills that fell out of box. Unsure if he has taken too many one day or not.  Express my concerns with Carl [name] about Carl [name] not taking his medications right and if he and his siter could check the boxes in between me coming and let me know if it gets off.  He does have 3 carvedilol that was still in his box that he did not take.  They are so small, explained to Carl [name] to be very careful not to leave any.  He is weighing daily and he might go up and down a pound, but weight has been pretty consistent throughout this month in upper 160's.  He denies chest pain, shortness of breath, headaches or dizziness.  Advised his brother that physical therapy was helping him but Carl [name] keeps coming up with excuses of not going.  Explained to Carl [name] he needs to go to get his strength back, he keeps saying he needs his new shoes first.  Carl [name] is working on those.   His cardio Mems was not transmitted, it was unplugged, plugged it back in and advised Carl [name] to try it later.  He appears to understand.  He has all his medications and placed them  in his boxes.  Will continue to visit for heart failure, medication compliance and diet.  [employer] is also getting meals on wheels, contacted them and they advised will get me the sodium content of their meals.   [place] [phone]

## 2020-06-21 ENCOUNTER — Encounter (HOSPITAL_COMMUNITY): Payer: Self-pay

## 2020-06-21 NOTE — Progress Notes (Signed)
Carl [name] w/paramedicine called back to f/u, she states pt's wt is stable but he does have some bilat LE edema. Upon review of meds and chart pt's Torsemide was increased to 80 mg Daily on 10/13 and he has been taking that dose and is on Metolazone every Friday, since Torsemide increased Optivol has leveled but has not decreased. Will send to Dr [name] for further recommendations and call Carl [name] back.

## 2020-06-21 NOTE — Progress Notes (Signed)
[name] advised me Carl [name] has a lot of fluid according his readings.  Contacted Advance Heart Failure clinic twice today to see if med changes need to be made with no response back yet.  Made Carl [name] with HF clinic aware to call his brother Carl [name] with any changes due to I will be off on Friday.  Carl [name] has in his box to take metolazone on Friday with extra potassium.   [place] [phone]

## 2020-06-22 MED ORDER — TORSEMIDE 20 MG PO TABS: ORAL_TABLET | ORAL | 1 refills | Status: DC

## 2020-06-22 NOTE — Progress Notes (Addendum)
Spoke with brother, [name].  Advised Dr [name] recommended to increase torsemide to 40 mg bid x 3 days then 40 qam/20 qpm after that with BMET 1 week.  He repeated instructions back correctly twice and verbalized understanding.  Advised [name] is aware of changes.  He agreed to [facility] lab appointment on 08/30/2019.   ICM remote transmission fluid level recheck changed to 07/02/2020 to recheck after increased Torsemide dosage.

## 2020-06-22 NOTE — Progress Notes (Signed)
Increase torsemide to 40 mg bid x 3 days then 40 qam/20 qpm after that with BMET 1 week.

## 2020-06-22 NOTE — Progress Notes (Signed)
Attempted call to Dorene Ar, EMT Paramedicine program.  Left message regarding Dr Claris Gladden recommendations to increase torsemide to 40 mg bid x 3 days then 40 qam/20 qpm after that with BMET 1 week.  Advised will call patient's brother to provide recommendations as well.

## 2020-06-22 NOTE — Progress Notes (Signed)
Received call back from brother.  He stated he was updating the Torsemide as discussed earlier today and currently in patient's pill box is 80 mg Torsemide every morning.  Advised the Torsemide script written on 06/18/2020 was for Torsemide 40 mg daily and Dr Claris Gladden orders to change it to 40 mg bid x 3 days then 40 qam/20 qpm today.  Advised unsure when he was instructed to take 80 mg every day but for now he will need to follow the orders given by Dr [name] to take Furosemide 40 mg bid x 3 days and then 40 mg every morning and 20 mg every afternoon.  Advised will contact [name], EMT paramedicine on 11/1 to discuss Torsemide dosage.

## 2020-06-22 NOTE — Telephone Encounter (Signed)
Attempted call to Dorene Ar, EMT Paramedicine program.  Left message regarding Dr Claris Gladden recommendations to increase torsemide to 40 mg bid x 3 days then 40 qam/20 qpm after that with BMET 1 week.  Advised will call patient's brother to provide recommendations as well.

## 2020-06-25 ENCOUNTER — Encounter (HOSPITAL_COMMUNITY): Payer: Self-pay

## 2020-06-25 ENCOUNTER — Telehealth (HOSPITAL_COMMUNITY): Payer: Self-pay | Admitting: *Deleted

## 2020-06-25 ENCOUNTER — Other Ambulatory Visit (HOSPITAL_COMMUNITY): Payer: Self-pay

## 2020-06-25 MED ORDER — TORSEMIDE 20 MG PO TABS: ORAL_TABLET | ORAL | 1 refills | Status: DC

## 2020-06-25 NOTE — Progress Notes (Signed)
Today had a visit with Marlow to change his torsemide dose. Torsemide 80 mg twice daily for 3 days plus extra 20 meq potassium then resume 60 mg in am and 20 mg in pm.  He has fluid showing.  His brother is aware of dose change and of blood work this Friday in [place] at [facility].  Myking appears more confused, walking around in boxers.  He finally goes in bedroom and gets dressed. He has not weighed this morning.  Assisted him to weigh, he could not steady his self to stand on scale.  I have grabbed him several times because he was about to fall.  Contacted his sister and brother and advised that it is time for Arash to have some one with him.  He missed a couple of pills in his box, including 2 of his torsemide.  Explained to both that someone needs to check his boxes daily to make sure he is taking them.  Explained to both that Derel is very sick.  They do not appear that they understand.  [name] was on her way over to take him off to visit a cousin.  Seamus stated he ate a banana for breakfast but could not tell me if he ate lunch or what he had yesterday.  Brother [name] said he will be over later to check on him and put up another rail on his steps going down to his washer.  Copeland Neisen to use his walker in the house and outside.  Will continue to visit for heart failure.   [place] [phone]

## 2020-06-25 NOTE — Telephone Encounter (Signed)
Kristi called to verify pt's dose of Torsemide as she states pt was instructed last week to increase dose to 40 mg BID x 3 days then 40/20. However, pt's dose was increased to 80 mg Daily on 06/06/20 due to increased edema after dose was decreased and Kristi has been filling his pill box with 80 mg Daily since that time.   Per Dr [name] increase to 80 mg BID with KCL 40 meq Daily for 3 days, bmet in a week and f/u on Optivol reading, Steffanie Dunn is aware and will adjust pill box today, med list updated

## 2020-06-25 NOTE — Progress Notes (Signed)
From: [name], EMT Sent: 06/25/2020   9:22 AM EDT To: Rosalene Billings, RN Subject: RE: McLean's recommendations                   I have spoke with [facility], we are increasing it to 80mg  twice a day for 3 days only.  He has blood work on Friday.

## 2020-06-27 ENCOUNTER — Encounter (HOSPITAL_COMMUNITY): Payer: Self-pay

## 2020-06-27 ENCOUNTER — Encounter (HOSPITAL_COMMUNITY): Payer: Self-pay | Admitting: Emergency Medicine

## 2020-06-27 ENCOUNTER — Inpatient Hospital Stay (HOSPITAL_COMMUNITY)
Admission: EM | Admit: 2020-06-27 | Discharge: 2020-07-05 | DRG: 377 | Disposition: A | Discharge status: Skilled Nursing Facility | Payer: Medicare Other | Attending: Internal Medicine | Admitting: Internal Medicine

## 2020-06-27 ENCOUNTER — Other Ambulatory Visit: Payer: Self-pay

## 2020-06-27 ENCOUNTER — Emergency Department (HOSPITAL_COMMUNITY): Payer: Medicare Other

## 2020-06-27 DIAGNOSIS — K7290 Hepatic failure, unspecified without coma: Secondary | ICD-10-CM | POA: Diagnosis present

## 2020-06-27 DIAGNOSIS — Z7901 Long term (current) use of anticoagulants: Secondary | ICD-10-CM | POA: Diagnosis not present

## 2020-06-27 DIAGNOSIS — K449 Diaphragmatic hernia without obstruction or gangrene: Secondary | ICD-10-CM | POA: Diagnosis present

## 2020-06-27 DIAGNOSIS — D649 Anemia, unspecified: Secondary | ICD-10-CM | POA: Diagnosis not present

## 2020-06-27 DIAGNOSIS — I2510 Atherosclerotic heart disease of native coronary artery without angina pectoris: Secondary | ICD-10-CM | POA: Diagnosis present

## 2020-06-27 DIAGNOSIS — L8990 Pressure ulcer of unspecified site, unspecified stage: Secondary | ICD-10-CM | POA: Insufficient documentation

## 2020-06-27 DIAGNOSIS — I10 Essential (primary) hypertension: Secondary | ICD-10-CM | POA: Diagnosis present

## 2020-06-27 DIAGNOSIS — N179 Acute kidney failure, unspecified: Secondary | ICD-10-CM | POA: Diagnosis present

## 2020-06-27 DIAGNOSIS — R5381 Other malaise: Secondary | ICD-10-CM | POA: Diagnosis present

## 2020-06-27 DIAGNOSIS — I130 Hypertensive heart and chronic kidney disease with heart failure and stage 1 through stage 4 chronic kidney disease, or unspecified chronic kidney disease: Secondary | ICD-10-CM | POA: Diagnosis present

## 2020-06-27 DIAGNOSIS — I248 Other forms of acute ischemic heart disease: Secondary | ICD-10-CM | POA: Diagnosis present

## 2020-06-27 DIAGNOSIS — K921 Melena: Principal | ICD-10-CM | POA: Diagnosis present

## 2020-06-27 DIAGNOSIS — I252 Old myocardial infarction: Secondary | ICD-10-CM | POA: Diagnosis not present

## 2020-06-27 DIAGNOSIS — N183 Chronic kidney disease, stage 3 unspecified: Secondary | ICD-10-CM | POA: Diagnosis present

## 2020-06-27 DIAGNOSIS — I255 Ischemic cardiomyopathy: Secondary | ICD-10-CM | POA: Diagnosis present

## 2020-06-27 DIAGNOSIS — Z87891 Personal history of nicotine dependence: Secondary | ICD-10-CM | POA: Diagnosis not present

## 2020-06-27 DIAGNOSIS — D500 Iron deficiency anemia secondary to blood loss (chronic): Secondary | ICD-10-CM | POA: Diagnosis present

## 2020-06-27 DIAGNOSIS — Z95810 Presence of automatic (implantable) cardiac defibrillator: Secondary | ICD-10-CM | POA: Diagnosis not present

## 2020-06-27 DIAGNOSIS — K7460 Unspecified cirrhosis of liver: Secondary | ICD-10-CM | POA: Diagnosis present

## 2020-06-27 DIAGNOSIS — F79 Unspecified intellectual disabilities: Secondary | ICD-10-CM | POA: Diagnosis present

## 2020-06-27 DIAGNOSIS — R5383 Other fatigue: Secondary | ICD-10-CM

## 2020-06-27 DIAGNOSIS — Z79899 Other long term (current) drug therapy: Secondary | ICD-10-CM | POA: Diagnosis not present

## 2020-06-27 DIAGNOSIS — N1832 Chronic kidney disease, stage 3b: Secondary | ICD-10-CM | POA: Diagnosis present

## 2020-06-27 DIAGNOSIS — K219 Gastro-esophageal reflux disease without esophagitis: Secondary | ICD-10-CM | POA: Diagnosis present

## 2020-06-27 DIAGNOSIS — E869 Volume depletion, unspecified: Secondary | ICD-10-CM | POA: Diagnosis present

## 2020-06-27 DIAGNOSIS — I4819 Other persistent atrial fibrillation: Secondary | ICD-10-CM | POA: Diagnosis present

## 2020-06-27 DIAGNOSIS — F32A Depression, unspecified: Secondary | ICD-10-CM | POA: Diagnosis present

## 2020-06-27 DIAGNOSIS — Z86718 Personal history of other venous thrombosis and embolism: Secondary | ICD-10-CM

## 2020-06-27 DIAGNOSIS — E871 Hypo-osmolality and hyponatremia: Secondary | ICD-10-CM | POA: Diagnosis present

## 2020-06-27 DIAGNOSIS — Z8774 Personal history of (corrected) congenital malformations of heart and circulatory system: Secondary | ICD-10-CM | POA: Diagnosis not present

## 2020-06-27 DIAGNOSIS — Z66 Do not resuscitate: Secondary | ICD-10-CM | POA: Diagnosis present

## 2020-06-27 DIAGNOSIS — I4510 Unspecified right bundle-branch block: Secondary | ICD-10-CM | POA: Diagnosis present

## 2020-06-27 DIAGNOSIS — K922 Gastrointestinal hemorrhage, unspecified: Secondary | ICD-10-CM | POA: Diagnosis present

## 2020-06-27 DIAGNOSIS — I5023 Acute on chronic systolic (congestive) heart failure: Secondary | ICD-10-CM | POA: Diagnosis present

## 2020-06-27 DIAGNOSIS — R42 Dizziness and giddiness: Secondary | ICD-10-CM

## 2020-06-27 DIAGNOSIS — Z951 Presence of aortocoronary bypass graft: Secondary | ICD-10-CM

## 2020-06-27 DIAGNOSIS — K766 Portal hypertension: Secondary | ICD-10-CM | POA: Diagnosis present

## 2020-06-27 DIAGNOSIS — Z20822 Contact with and (suspected) exposure to covid-19: Secondary | ICD-10-CM | POA: Diagnosis present

## 2020-06-27 DIAGNOSIS — K3189 Other diseases of stomach and duodenum: Secondary | ICD-10-CM | POA: Diagnosis present

## 2020-06-27 LAB — CBC
HCT: 21 % — ABNORMAL LOW (ref 39.0–52.0)
HCT: 22.9 % — ABNORMAL LOW (ref 39.0–52.0)
HCT: 23.8 % — ABNORMAL LOW (ref 39.0–52.0)
Hemoglobin: 6.5 g/dL — CL (ref 13.0–17.0)
Hemoglobin: 7.1 g/dL — ABNORMAL LOW (ref 13.0–17.0)
Hemoglobin: 7.4 g/dL — ABNORMAL LOW (ref 13.0–17.0)
MCH: 31.6 pg (ref 26.0–34.0)
MCH: 30.5 pg (ref 26.0–34.0)
MCH: 30.5 pg (ref 26.0–34.0)
MCHC: 31 g/dL (ref 30.0–36.0)
MCHC: 31 g/dL (ref 30.0–36.0)
MCHC: 31.1 g/dL (ref 30.0–36.0)
MCV: 101.9 fL — ABNORMAL HIGH (ref 80.0–100.0)
MCV: 98.3 fL (ref 80.0–100.0)
MCV: 97.9 fL (ref 80.0–100.0)
Platelets: 300 10*3/uL (ref 150–400)
Platelets: 270 10*3/uL (ref 150–400)
Platelets: 250 10*3/uL (ref 150–400)
RBC: 2.06 MIL/uL — ABNORMAL LOW (ref 4.22–5.81)
RBC: 2.33 MIL/uL — ABNORMAL LOW (ref 4.22–5.81)
RBC: 2.43 MIL/uL — ABNORMAL LOW (ref 4.22–5.81)
RDW: 15.7 % — ABNORMAL HIGH (ref 11.5–15.5)
RDW: 18.1 % — ABNORMAL HIGH (ref 11.5–15.5)
RDW: 18.1 % — ABNORMAL HIGH (ref 11.5–15.5)
WBC: 5.8 10*3/uL (ref 4.0–10.5)
WBC: 5.3 10*3/uL (ref 4.0–10.5)
WBC: 5 10*3/uL (ref 4.0–10.5)
nRBC: 0 % (ref 0.0–0.2)
nRBC: 0.4 % — ABNORMAL HIGH (ref 0.0–0.2)
nRBC: 0 % (ref 0.0–0.2)

## 2020-06-27 LAB — COMPREHENSIVE METABOLIC PANEL
ALT: 22 U/L (ref 0–44)
AST: 33 U/L (ref 15–41)
Albumin: 3.3 g/dL — ABNORMAL LOW (ref 3.5–5.0)
Alkaline Phosphatase: 83 U/L (ref 38–126)
Anion gap: 14 (ref 5–15)
BUN: 89 mg/dL — ABNORMAL HIGH (ref 8–23)
CO2: 28 mmol/L (ref 22–32)
Calcium: 9.3 mg/dL (ref 8.9–10.3)
Chloride: 85 mmol/L — ABNORMAL LOW (ref 98–111)
Creatinine, Ser: 3.51 mg/dL — ABNORMAL HIGH (ref 0.61–1.24)
GFR, Estimated: 18 mL/min — ABNORMAL LOW (ref >60)
Glucose, Bld: 116 mg/dL — ABNORMAL HIGH (ref 70–99)
Potassium: 3.6 mmol/L (ref 3.5–5.1)
Sodium: 127 mmol/L — ABNORMAL LOW (ref 135–145)
Total Bilirubin: 1.3 mg/dL — ABNORMAL HIGH (ref 0.3–1.2)
Total Protein: 6.1 g/dL — ABNORMAL LOW (ref 6.5–8.1)

## 2020-06-27 LAB — RESPIRATORY PANEL BY RT PCR (FLU A&B, COVID)
Influenza A by PCR: NEGATIVE
Influenza B by PCR: NEGATIVE
SARS Coronavirus 2 by RT PCR: NEGATIVE

## 2020-06-27 LAB — PREPARE RBC (CROSSMATCH)

## 2020-06-27 LAB — AMMONIA: Ammonia: 60 umol/L — ABNORMAL HIGH (ref 9–35)

## 2020-06-27 LAB — HEMOGLOBIN AND HEMATOCRIT, BLOOD
HCT: 24.4 % — ABNORMAL LOW (ref 39.0–52.0)
Hemoglobin: 7.7 g/dL — ABNORMAL LOW (ref 13.0–17.0)

## 2020-06-27 LAB — POC OCCULT BLOOD, ED: Fecal Occult Bld: POSITIVE — AB

## 2020-06-27 LAB — PROTIME-INR
INR: 2.2 — ABNORMAL HIGH (ref 0.8–1.2)
Prothrombin Time: 23.3 seconds — ABNORMAL HIGH (ref 11.4–15.2)

## 2020-06-27 LAB — TROPONIN I (HIGH SENSITIVITY)
Troponin I (High Sensitivity): 49 ng/L — ABNORMAL HIGH (ref <18)
Troponin I (High Sensitivity): 38 ng/L — ABNORMAL HIGH (ref <18)

## 2020-06-27 LAB — BRAIN NATRIURETIC PEPTIDE: B Natriuretic Peptide: 1583.2 pg/mL — ABNORMAL HIGH (ref 0.0–100.0)

## 2020-06-27 LAB — HIV ANTIBODY (ROUTINE TESTING W REFLEX): HIV Screen 4th Generation wRfx: NONREACTIVE

## 2020-06-27 MED ORDER — ACETAMINOPHEN 325 MG PO TABS
650.0000 mg | ORAL_TABLET | Freq: Four times a day (QID) | ORAL | Status: DC | PRN
  Administered 2020-07-01: 650 mg via ORAL
  Filled 2020-06-27: qty 2

## 2020-06-27 MED ORDER — ACETAMINOPHEN 650 MG RE SUPP: 650.0000 mg | Freq: Four times a day (QID) | RECTAL | Status: DC | PRN

## 2020-06-27 MED ORDER — AMIODARONE HCL 200 MG PO TABS
200.0000 mg | ORAL_TABLET | Freq: Every day | ORAL | Status: DC
  Administered 2020-06-30 – 2020-07-05 (×6): 200 mg via ORAL
  Filled 2020-06-27 (×7): qty 1

## 2020-06-27 MED ORDER — DULOXETINE HCL 30 MG PO CPEP
30.0000 mg | ORAL_CAPSULE | Freq: Every evening | ORAL | Status: DC
  Administered 2020-06-27 – 2020-07-04 (×8): 30 mg via ORAL
  Filled 2020-06-27 (×9): qty 1

## 2020-06-27 MED ORDER — SODIUM CHLORIDE 0.9% IV SOLUTION: Freq: Once | INTRAVENOUS | Status: AC

## 2020-06-27 MED ORDER — SODIUM CHLORIDE 0.9 % IV SOLN
10.0000 mL/h | Freq: Once | INTRAVENOUS | Status: AC
  Administered 2020-06-27: 10 mL/h via INTRAVENOUS

## 2020-06-27 MED ORDER — PANTOPRAZOLE SODIUM 40 MG IV SOLR
40.0000 mg | Freq: Once | INTRAVENOUS | Status: AC
  Administered 2020-06-27: 40 mg via INTRAVENOUS
  Filled 2020-06-27: qty 40

## 2020-06-27 MED ORDER — SODIUM CHLORIDE 0.9 % IV SOLN
8.0000 mg/h | INTRAVENOUS | Status: DC
  Administered 2020-06-27 – 2020-06-28 (×2): 8 mg/h via INTRAVENOUS
  Filled 2020-06-27 (×4): qty 80

## 2020-06-27 MED ORDER — SODIUM CHLORIDE 0.9 % IV BOLUS: 500.0000 mL | Freq: Once | INTRAVENOUS | Status: DC

## 2020-06-27 NOTE — Plan of Care (Signed)
  Problem: Activity: Goal: Risk for activity intolerance will decrease Outcome: Progressing   Problem: Coping: Goal: Level of anxiety will decrease Outcome: Progressing   Problem: Safety: Goal: Ability to remain free from injury will improve Outcome: Progressing   
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
## 2020-06-27 NOTE — Progress Notes (Signed)
Drue's brother [name] contacted me this morning and advised Marvel is very confused with his medications and believe he took morning and night time meds all at once last night.  Advised him that they are going to have to start to manage Aneudy medications with him taking them.  He agreed, between him and his sister they will give him his medications from the boxes I fill.  After talking with him received a call from [place] from GI, he seen [name] yesterday and his hemoglobin is 6.3 and sodium is very low.  He wants Terrance to go straight to hospital.  Advised him [name] will take him to [facility].  Julio Sicks and he said he will be on the way with him.  Also advised [facility] of same.   [place] [phone]

## 2020-06-27 NOTE — Consult Note (Addendum)
Referring Provider: Dr. Harrell Gave Tegeler Primary Care Physician:  [name], MD Primary Gastroenterologist:  Althia Forts (followed by [place])  Reason for Consultation:  Anemia  HPI: Carl [name] is a 69 y.o. male with past medical history of CAD s/p CABG, CHF (EF 20-25% as of TEE 10/2019),  A fib(on Eliquis), AICD, chronic IDA, cirrhosis, CKD/cardiorenal syndrome, and AVMs presenting for consultation of anemia.  Patient was seen in the [facility] yesterday and found to have a Hgb of 6.8, thus was advised to present to the ED.  Patient has intellectual disability and is able to provide some history, but most history is obtained from patient's brother, Carl [name].  Patient reports he has been feeling more tired for the last few weeks.  Patient's brother states that the patient has been more fatigued and dizzy and has had multiple falls.  Patient lives at home, but both the patient's brother and sister live within 2 blocks of the patient.  No known rectal bleeding or melena, the ED physician reported dark heme positive stools on DRE.  Patient's brother states that the patient's appetite has been worse over the last few weeks patient has not been eating as much as usual.  However, he denies any known weight loss.  No no nausea or vomiting.  No dysphagia, the patient reports some throat discomfort.  Patient takes Eliquis, last dose yesterday.  Denies aspirin or NSAID use.  No known family history of colon cancer or gastrointestinal malignancy.  Colonoscopy 07/2017: Two colonic AVMs s/p APC and clip placement, internal hemorrhoids, one small polyp (bx: tubular adenoma, negative for high grade dysplasia or malignancy).  EGD 07/2017: LA grade A esophagitis (bx: Columnar-lined mucosa with mild inflammation and reactive hyperplasia, negative for goblet cells, dysplasia, or malignancy), and gastritis (bx: Negative for H. pylori).  Past Medical History:  Diagnosis Date  . AICD  (automatic cardioverter/defibrillator) present   . Anemia   . Anxiety   . Arthritis   . Brunner's gland hyperplasia of duodenum   . CHF (congestive heart failure) ([place])   . Coronary artery disease   . External hemorrhoids   . Fracture of femoral neck, left ([place]) 07/21/2018  . GERD (gastroesophageal reflux disease)   . Hearing loss   . HH (hiatus hernia)   . Internal hemorrhoids   . Ischemic cardiomyopathy   . Leg fracture, left   . Myocardial infarction ([place])   . Paronychia   . Pneumonia   . Psoriasis   . PUD (peptic ulcer disease)   . Schatzki's ring   . Tubular adenoma of colon   . Vitiligo     Past Surgical History:  Procedure Laterality Date  . ATRIAL SEPTAL DEFECT(ASD) CLOSURE N/A 12/30/2018   Procedure: ATRIAL SEPTAL DEFECT(ASD) CLOSURE;  Surgeon: [name], MD;  Location: [facility];  Service: Cardiovascular;  Laterality: N/A;  . CARDIAC SURGERY    . CARDIOVERSION N/A 11/09/2019   Procedure: CARDIOVERSION;  Surgeon: [name], MD;  Location: [facility] ENDOSCOPY;  Service: Cardiovascular;  Laterality: N/A;  . CARDIOVERSION N/A 12/21/2019   Procedure: CARDIOVERSION;  Surgeon: [name], MD;  Location: [facility] ENDOSCOPY;  Service: Cardiovascular;  Laterality: N/A;  . COLONOSCOPY WITH ESOPHAGOGASTRODUODENOSCOPY (EGD)    . COLONOSCOPY WITH PROPOFOL N/A 08/24/2017   Procedure: COLONOSCOPY WITH PROPOFOL;  Surgeon: [name], MD;  Location: [facility] ENDOSCOPY;  Service: Endoscopy;  Laterality: N/A;  . CORONARY ARTERY BYPASS GRAFT N/A 08/03/2014   Procedure: CORONARY ARTERY BYPASS GRAFTING (  CABG);  Surgeon: [name], MD;  Location: [facility] OR;  Service: Open Heart Surgery;  Laterality: N/A;  . EP IMPLANTABLE DEVICE N/A 05/01/2015   MDT ICD implanted for primary prevention of sudden death  . ESOPHAGOGASTRODUODENOSCOPY (EGD) WITH PROPOFOL N/A 04/16/2015   Procedure: ESOPHAGOGASTRODUODENOSCOPY (EGD) WITH PROPOFOL;  Surgeon: [name], MD;  Location: [facility]  ENDOSCOPY;  Service: Endoscopy;  Laterality: N/A;  . ESOPHAGOGASTRODUODENOSCOPY (EGD) WITH PROPOFOL N/A 08/24/2017   Procedure: ESOPHAGOGASTRODUODENOSCOPY (EGD) WITH PROPOFOL;  Surgeon: [name], MD;  Location: [facility] ENDOSCOPY;  Service: Endoscopy;  Laterality: N/A;  . FRACTURE SURGERY    . HERNIA REPAIR    . HIP PINNING,CANNULATED Left 07/22/2018   Procedure: CANNULATED HIP PINNING;  Surgeon: [name], MD;  Location: [place];  Service: Orthopedics;  Laterality: Left;  . MITRAL VALVE REPAIR N/A 12/30/2018   Procedure: MITRAL VALVE REPAIR;  Surgeon: [name], MD;  Location: [place] [facility];  Service: Cardiovascular;  Laterality: N/A;  . RIGHT/LEFT HEART CATH AND CORONARY/GRAFT ANGIOGRAPHY N/A 12/21/2018   Procedure: RIGHT/LEFT HEART CATH AND CORONARY/GRAFT ANGIOGRAPHY;  Surgeon: [name], MD;  Location: [place] [facility];  Service: Cardiovascular;  Laterality: N/A;  . TEE WITHOUT CARDIOVERSION N/A 08/03/2014   Procedure: TRANSESOPHAGEAL ECHOCARDIOGRAM (TEE);  Surgeon: [name], MD;  Location: [place];  Service: Open Heart Surgery;  Laterality: N/A;  . TEE WITHOUT CARDIOVERSION N/A 02/10/2018   Procedure: TRANSESOPHAGEAL ECHOCARDIOGRAM (TEE);  Surgeon: [name], MD;  Location: [facility] ENDOSCOPY;  Service: Cardiovascular;  Laterality: N/A;  . TEE WITHOUT CARDIOVERSION N/A 11/09/2019   Procedure: TRANSESOPHAGEAL ECHOCARDIOGRAM (TEE);  Surgeon: [name], MD;  Location: [facility] ENDOSCOPY;  Service: Cardiovascular;  Laterality: N/A;    Prior to Admission medications   Medication Sig Start Date End Date Taking? Authorizing Provider  acetaminophen (TYLENOL) 500 MG tablet Take 500-1,000 mg by mouth every 6 (six) hours as needed (for pain.).   Yes [provider]  allopurinol (ZYLOPRIM) 100 MG tablet Take 100 mg by mouth daily.  02/08/19  Yes [provider]  amiodarone (PACERONE) 200 MG tablet Take 1 tablet (200 mg total) by mouth daily. 04/13/20  Yes  Bensimhon, Shaune Pascal, MD  apixaban (ELIQUIS) 5 MG TABS tablet Take 1 tablet (5 mg total) by mouth 2 (two) times daily. 04/17/20  Yes [name], MD  Ascorbic Acid (VITAMIN C) 100 MG tablet Take 100 mg by mouth daily.   Yes [provider]  atorvastatin (LIPITOR) 40 MG tablet TAKE 1 TABLET (40 MG TOTAL) BY MOUTH DAILY AT 6 PM. Patient taking differently: Take 40 mg by mouth at bedtime.  09/14/19  Yes [name], MD  carvedilol (COREG) 3.125 MG tablet Take 1 tablet (3.125 mg total) by mouth 2 (two) times daily. 05/21/20  Yes Lyda Jester M, PA-C  Cholecalciferol (VITAMIN D3 SUPER STRENGTH) 50 MCG (2000 UT) TABS Take 2,000 Units by mouth at bedtime.    Yes [provider]  cyanocobalamin 500 MCG tablet Take 500 mcg by mouth at bedtime.    Yes [provider]  docusate sodium (COLACE) 100 MG capsule Take 100 mg by mouth daily as needed for mild constipation.   Yes [provider]  DULoxetine (CYMBALTA) 30 MG capsule Take 30 mg by mouth every evening.    Yes [provider]  ferrous sulfate 325 (65 FE) MG tablet Take 1 tablet (325 mg total) by mouth daily with breakfast. Patient taking differently: Take 325 mg by  mouth daily.  02/09/18  Yes [name], MD  gabapentin (NEURONTIN) 300 MG capsule Take 300 mg by mouth at bedtime.    Yes [provider]  ketoconazole (NIZORAL) 2 % shampoo Apply 1 application topically 2 (two) times a week.  09/28/18  Yes [provider]  metolazone (ZAROXOLYN) 2.5 MG tablet Take 1 tablet (2.5 mg total) by mouth once a week. Take 20 mEq Potassium with Metolazone. Patient taking differently: Take 2.5 mg by mouth once a week. Take 20 mEq Potassium with Metolazone. Taking on Fridays 05/30/20 08/28/20 Yes [name], MD  Multiple Vitamin (MULTIVITAMIN) tablet Take 1 tablet by mouth at bedtime.    Yes [provider]  pantoprazole (PROTONIX) 40 MG tablet TAKE 1 TABLET BY MOUTH EVERY  DAY Patient taking differently: Take 40 mg by mouth daily.  12/08/19  Yes [name], MD  potassium chloride SA (KLOR-CON M20) 20 MEQ tablet Take 1 tablet (20 mEq total) by mouth daily. 06/06/20  Yes [name], MD  torsemide (DEMADEX) 20 MG tablet Take 4 tablets (80 mg total) by mouth 2 (two) times daily for 3 days, THEN 4 tablets (80 mg total) daily. Patient taking differently: 40mg  bid 06/25/20 06/28/21 Yes [name], MD    Scheduled Meds: Continuous Infusions: PRN Meds:.  Allergies as of 06/27/2020 - Review Complete 06/27/2020  Allergen Reaction Noted  . Spironolactone Other (See Comments) 02/20/2017    Family History  Problem Relation Age of Onset  . Valvular heart disease Mother        Ruptured valve  . CAD Father   . Heart Problems Brother        Stents x 4  . Diabetes Brother   . Prostate cancer Neg Hx   . Bladder Cancer Neg Hx   . Kidney cancer Neg Hx     Social History   Socioeconomic History  . Marital status: Single    Spouse name: Not on file  . Number of children: Not on file  . Years of education: Not on file  . Highest education level: Not on file  Occupational History  . Not on file  Tobacco Use  . Smoking status: Former Smoker    Quit date: 2014    Years since quitting: 7.8  . Smokeless tobacco: Never Used  . Tobacco comment: 07/30/2017 Quit in 2014  Vaping Use  . Vaping Use: Never used  Substance and Sexual Activity  . Alcohol use: No    Alcohol/week: 0.0 standard drinks  . Drug use: No  . Sexual activity: Never  Other Topics Concern  . Not on file  Social History Narrative   Pt lives in [place] with his mother.   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 Difficulty of Paying Living Expenses: Not on file  Food Insecurity:   . Worried About Charity fundraiser in the Last Year: Not on file  . Ran Out of Food in the Last Year: Not on file  Transportation  Needs:   . Lack of Transportation (Medical): Not on file  . Lack of Transportation (Non-Medical): Not on file  Physical Activity:   . Days of Exercise per Week: Not on file  . Minutes of Exercise per Session: Not on file  Stress:   . Feeling of Stress : Not on file  Social Connections:   . Frequency of Communication with Friends and Family: Not  on file  . Frequency of Social Gatherings with Friends and Family: Not on file  . Attends Religious Services: Not on file  . Active Member of Clubs or Organizations: Not on file  . Attends Archivist Meetings: Not on file  . Marital Status: Not on file  Intimate Partner Violence:   . Fear of Current or Ex-Partner: Not on file  . Emotionally Abused: Not on file  . Physically Abused: Not on file  . Sexually Abused: Not on file    Review of Systems: Review of Systems  Constitutional: Positive for malaise/fatigue. Negative for chills, fever and weight loss.  HENT: Positive for sore throat.   Eyes: Negative for pain and redness.  Respiratory: Negative for cough, shortness of breath and stridor.   Cardiovascular: Negative for chest pain and palpitations.  Gastrointestinal: Negative for abdominal pain, blood in stool, constipation, diarrhea, heartburn, melena, nausea and vomiting.  Genitourinary: Negative for flank pain and hematuria.  Musculoskeletal: Positive for falls. Negative for joint pain.  Skin: Negative for itching and rash.  Neurological: Positive for dizziness. Negative for loss of consciousness.  Endo/Heme/Allergies: Negative for polydipsia. Does not bruise/bleed easily.  Psychiatric/Behavioral: Negative for substance abuse. The patient is not nervous/anxious.      Physical Exam: Vital signs: Vitals:   06/27/20 1250 06/27/20 1313  BP: (!) 96/58 (!) 93/55  Pulse: 64 64  Resp: 14 16  Temp: 97.7 F (36.5 C) 98.3 F (36.8 C)  SpO2: 100% 100%     Physical Exam Vitals reviewed.  Constitutional:      Appearance:  Normal appearance.  HENT:     Head: Normocephalic and atraumatic.     Nose: Nose normal.     Mouth/Throat:     Mouth: Mucous membranes are moist.     Pharynx: Oropharynx is clear.  Eyes:     General: No scleral icterus.    Extraocular Movements: Extraocular movements intact.     Comments: Conjunctival pallor  Cardiovascular:     Rate and Rhythm: Normal rate and regular rhythm.     Pulses: Normal pulses.     Heart sounds: Normal heart sounds.  Pulmonary:     Effort: Pulmonary effort is normal. No respiratory distress.     Breath sounds: Normal breath sounds.  Abdominal:     General: Bowel sounds are normal. There is no distension.     Palpations: Abdomen is soft. There is no mass.     Tenderness: There is no abdominal tenderness. There is no guarding or rebound.     Hernia: No hernia is present.  Musculoskeletal:        General: No tenderness.     Cervical back: Normal range of motion and neck supple.     Right lower leg: Edema present.     Left lower leg: Edema present.  Skin:    General: Skin is warm and dry.  Neurological:     General: No focal deficit present.     Mental Status: He is alert. Mental status is at baseline.     Comments: +asterixis  Psychiatric:        Mood and Affect: Mood normal.        Behavior: Behavior normal.      GI:  Lab Results: Recent Labs    06/27/20 1122  WBC 5.8  HGB 6.5*  HCT 21.0*  PLT 300   BMET Recent Labs    06/27/20 1122  NA 127*  K 3.6  CL 85*  CO2 28  GLUCOSE 116*  BUN 89*  CREATININE 3.51*  CALCIUM 9.3   LFT Recent Labs    06/27/20 1122  PROT 6.1*  ALBUMIN 3.3*  AST 33  ALT 22  ALKPHOS 83  BILITOT 1.3*   PT/INR No results for input(s): LABPROT, INR in the last 72 hours.   Studies/Results: DG Chest Portable 1 View  Result Date: 06/27/2020 CLINICAL DATA:  69 year old male with fatigue. Lower extremity swelling. EXAM: PORTABLE CHEST 1 VIEW COMPARISON:  Chest radiographs 03/03/2019 and earlier.  FINDINGS: Portable AP semi upright view at 1210 hours. Chronic left chest cardiac AICD. Prior CABG. Stable cardiomegaly and mediastinal contours. Mildly lower lung volumes. Mild new veiling opacity at the lung bases suspicious for pleural effusion. Pulmonary vascular congestion similar to but increased from last year. No pneumothorax. No air bronchograms. Paucity of bowel gas in the upper abdomen. Chronic right rib fractures. No acute osseous abnormality identified. IMPRESSION: Suspect acute pulmonary interstitial edema with small bilateral pleural effusions. Chronic cardiomegaly, AICD. Electronically Signed   By: Genevie Ann M.D.   On: 06/27/2020 12:23    Impression: Acute on chronic anemia and heme positive stool -Patient denies melena or hematochezia, though ED provider reported dark stool on DRE -Hemoglobin 6.5 today, 6.8 yesterday.  Hemoglobin 11.4 on 04/20/2020 -INR 2.6 as of 06/26/2020; repeat ordered for today -Ferritin 17, iron 10, and iron saturation 2% as of 06/26/2020 -History of esophagitis and Barrett's esophagus -History of colonic AVMs -Patient hypotensive (96/58), though per chart review, patient's BP is typically around 100/68  Cirrhosis, MELD score of 32 as of 06/26/20. Presumably NASH- per chart review, work up from [place] [facility] negative for other causes of cirrhosis, and patient without history of alcohol use. -T bili 1.3/AST 33/ALT 22/ALP 83 today -INR 2.6 as of 06/26/20  CAD s/p CABG, CHF (EF 20-25% as of TEE 10/2019),  A fib (on Eliquis), AICD  CKD/cardiorenal syndrome -Cr 3.51/ BUN 89 as of 06/27/20  Intellectual disability  Plan: Initiate Protonix drip.  PT/INR ordered.  Mild asterixis on exam, will order ammonia level.  Continue to hold Eliquis.  Plan for EGD tomorrow if INR <2.0.  We thoroughly discussed the procedure with the patient and patient's brother to include nature, alternatives, benefits, and risks (including but not limited to bleeding, infection,  perforation, anesthesia/cardiac and pulmonary complications).  Patient's brother verbalized understanding and gave verbal consent to proceed with EGD.  If EGD is negative, patient will most likely need a repeat colonoscopy (inpatient versus outpatient).  Clear liquid diet OK with NPO after midnight.  Continue to monitor H&H with transfusion as needed to maintain hemoglobin greater than 7.  [facility] will follow.   LOS: 0 days   Salley Slaughter  PA-C 06/27/2020, 1:33 PM  Contact #  [phone]

## 2020-06-27 NOTE — ED Triage Notes (Signed)
Sent to the ED per patient "Check my blood" due to low hemoglobin. Patient denies pain or complaints.

## 2020-06-27 NOTE — Hospital Course (Addendum)
LOS: 0  69 y.o. yo male admitted on 06/27/2020 for symptomatic anemia secondary to acute GI bleed.  IMAGING CXR>pulm edema with small b/l effusions  ASSESSMENT/PLAN  Symptomatic anemia. Admissioin hgb 6.3.. s/p 2U PRBC. hgb stable at 8.2  2. Cirrhosis. Lactulose ordered  3. AKI improving  FAMILY COMMUNICATION:  Barrier to D/C:

## 2020-06-27 NOTE — Progress Notes (Signed)
[facility] ([facility] Community Based Palliative Care       This patient is enrolled in our palliative care services in the community.  [facility] will continue to follow for any discharge planning needs and to coordinate continuation of palliative care.   If you have questions or need assistance, please call [phone] or contact the hospital Liaison listed on AMION.     Thank you for the opportunity to participate in this patient's care.     Chrislyn King, BSN, RN [facility] Hospital Liaison   [phone]   

## 2020-06-27 NOTE — ED Notes (Addendum)
Call pts brother mike Jardin when back to a room

## 2020-06-27 NOTE — ED Notes (Signed)
Dr. Sherry Ruffing notified about 6.5 hemoglobin

## 2020-06-27 NOTE — ED Provider Notes (Signed)
[facility]ERG[facility]Y DEPARTMENT Provider Note   CSN: [id number] Arrival date & time: 06/27/20  1100     History Chief Complaint  Patient presents with  . Abnormal Lab    Carl [name] is a 69 y.o. male.  The history is provided by the patient and medical records. No language interpreter was used.  Illness Location:  Generalized fatigue Quality:  Lightheadedness Severity:  Moderate Onset quality:  Gradual Timing:  Constant Progression:  Worsening Chronicity:  New Associated symptoms: fatigue   Associated symptoms: no abdominal pain, no chest pain, no congestion, no cough, no diarrhea, no fever, no headaches, no loss of consciousness, no nausea, no rash, no rhinorrhea, no shortness of breath, no sore throat, no vomiting and no wheezing        Past Medical History:  Diagnosis Date  . AICD (automatic cardioverter/defibrillator) present   . Anemia   . Anxiety   . Arthritis   . Brunner's gland hyperplasia of duodenum   . CHF (congestive heart failure) ([place])   . Coronary artery disease   . External hemorrhoids   . Fracture of femoral neck, left ([place]) 07/21/2018  . GERD (gastroesophageal reflux disease)   . Hearing loss   . HH (hiatus hernia)   . Internal hemorrhoids   . Ischemic cardiomyopathy   . Leg fracture, left   . Myocardial infarction ([place])   . Paronychia   . Pneumonia   . Psoriasis   . PUD (peptic ulcer disease)   . Schatzki's ring   . Tubular adenoma of colon   . Vitiligo     Patient Active Problem List   Diagnosis Date Noted  . 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 Persistent atrial fibrillation ([place]) 11/30/2019  . Secondary hypercoagulable state ([place]) 11/30/2019  . Arm DVT (deep venous thromboembolism), acute, left ([place]) 11/26/2019  . Hx of adenomatous colonic polyps 05/18/2019  . Cirrhosis, non-alcoholic ([place]) 35/45/6256  . Occult blood positive stool 05/12/2019  . HCAP (healthcare-associated pneumonia) 03/04/2019  .  Hallucinations 02/10/2019  . Elevated blood sugar 01/27/2019  . S/P mitral valve repair 12/31/2018  . Bilateral pulmonary contusion 10/17/2018  . Cardiac LV ejection fraction of 20-34% 10/17/2018  . Chronic systolic CHF (congestive heart failure) ([place]) 10/17/2018  . History of ischemic cardiomyopathy 10/17/2018  . ICD (implantable cardioverter-defibrillator) in place 10/17/2018  . S/p left hip fracture 10/17/2018  . Traumatic retroperitoneal hematoma 10/17/2018  . Age-related osteoporosis with current pathological fracture with routine healing 07/28/2018  . CAD (coronary artery disease) 07/21/2018  . GERD (gastroesophageal reflux disease) 07/21/2018  . Depression 07/21/2018  . CKD (chronic kidney disease), stage III ([place]) 07/21/2018  . Iron deficiency anemia 07/21/2018  . Hypokalemia 07/21/2018  . Closed left hip fracture ([place]) 07/21/2018  . Fracture of femoral neck, left ([place]) 07/21/2018  . Lower extremity pain, bilateral 11/12/2017  . 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 Hyperlipidemia 09/01/2017  . Essential hypertension 09/01/2017  . PAD (peripheral artery disease) ([place]) 09/01/2017  . Anemia, unspecified 08/06/2017  . GAD (generalized anxiety disorder) 06/18/2017  . Prostate cancer screening 04/28/2017  . Hydrocele 04/28/2017  . Bradycardia   . Premature ventricular contraction   . Severe mitral insufficiency   . Cardiomyopathy, ischemic 04/26/2015  . Difficulty hearing 12/29/2014  . Acute on chronic systolic CHF (congestive heart failure) ([place]) 10/10/2014  . 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 Protein-calorie malnutrition, severe ([place]) 07/29/2014  . AKI (acute kidney injury) ([place]) 07/29/2014  Past Surgical History:  Procedure Laterality Date  . ATRIAL SEPTAL DEFECT(ASD) CLOSURE N/A 12/30/2018   Procedure: ATRIAL SEPTAL DEFECT(ASD) CLOSURE;  Surgeon: [name], MD;  Location: [place] CV LAB;  Service: Cardiovascular;  Laterality: N/A;  . CARDIAC  SURGERY    . CARDIOVERSION N/A 11/09/2019   Procedure: CARDIOVERSION;  Surgeon: [name], MD;  Location: [facility] ENDOSCOPY;  Service: Cardiovascular;  Laterality: N/A;  . CARDIOVERSION N/A 12/21/2019   Procedure: CARDIOVERSION;  Surgeon: [name], MD;  Location: [facility] ENDOSCOPY;  Service: Cardiovascular;  Laterality: N/A;  . COLONOSCOPY WITH ESOPHAGOGASTRODUODENOSCOPY (EGD)    . COLONOSCOPY WITH PROPOFOL N/A 08/24/2017   Procedure: COLONOSCOPY WITH PROPOFOL;  Surgeon: [name], MD;  Location: [facility] ENDOSCOPY;  Service: Endoscopy;  Laterality: N/A;  . CORONARY ARTERY BYPASS GRAFT N/A 08/03/2014   Procedure: CORONARY ARTERY BYPASS GRAFTING (CABG);  Surgeon: [name], MD;  Location: [place];  Service: Open Heart Surgery;  Laterality: N/A;  . EP IMPLANTABLE DEVICE N/A 05/01/2015   MDT ICD implanted for primary prevention of sudden death  . ESOPHAGOGASTRODUODENOSCOPY (EGD) WITH PROPOFOL N/A 04/16/2015   Procedure: ESOPHAGOGASTRODUODENOSCOPY (EGD) WITH PROPOFOL;  Surgeon: [name], MD;  Location: [facility] ENDOSCOPY;  Service: Endoscopy;  Laterality: N/A;  . ESOPHAGOGASTRODUODENOSCOPY (EGD) WITH PROPOFOL N/A 08/24/2017   Procedure: ESOPHAGOGASTRODUODENOSCOPY (EGD) WITH PROPOFOL;  Surgeon: [name], MD;  Location: [facility] ENDOSCOPY;  Service: Endoscopy;  Laterality: N/A;  . FRACTURE SURGERY    . HERNIA REPAIR    . HIP PINNING,CANNULATED Left 07/22/2018   Procedure: CANNULATED HIP PINNING;  Surgeon: [name], MD;  Location: [facility];  Service: Orthopedics;  Laterality: Left;  . MITRAL VALVE REPAIR N/A 12/30/2018   Procedure: MITRAL VALVE REPAIR;  Surgeon: [name], MD;  Location: [place] CV LAB;  Service: Cardiovascular;  Laterality: N/A;  . RIGHT/LEFT HEART CATH AND CORONARY/GRAFT ANGIOGRAPHY N/A 12/21/2018   Procedure: RIGHT/LEFT HEART CATH AND CORONARY/GRAFT ANGIOGRAPHY;  Surgeon: [name], MD;  Location: [place] CV LAB;  Service: Cardiovascular;   Laterality: N/A;  . TEE WITHOUT CARDIOVERSION N/A 08/03/2014   Procedure: TRANSESOPHAGEAL ECHOCARDIOGRAM (TEE);  Surgeon: [name], MD;  Location: [facility];  Service: Open Heart Surgery;  Laterality: N/A;  . TEE WITHOUT CARDIOVERSION N/A 02/10/2018   Procedure: TRANSESOPHAGEAL ECHOCARDIOGRAM (TEE);  Surgeon: [name], MD;  Location: [facility] ENDOSCOPY;  Service: Cardiovascular;  Laterality: N/A;  . TEE WITHOUT CARDIOVERSION N/A 11/09/2019   Procedure: TRANSESOPHAGEAL ECHOCARDIOGRAM (TEE);  Surgeon: [name], MD;  Location: [facility] ENDOSCOPY;  Service: Cardiovascular;  Laterality: N/A;       Family History  Problem Relation Age of Onset  . Valvular heart disease Mother        Ruptured valve  . CAD Father   . Heart Problems Brother        Stents x 4  . Diabetes Brother   . Prostate cancer Neg Hx   . Bladder Cancer Neg Hx   . Kidney cancer Neg Hx     Social History   Tobacco Use  . Smoking status: Former Smoker    Quit date: 2014    Years since quitting: 7.8  . Smokeless tobacco: Never Used  . Tobacco comment: 07/30/2017 Quit in 2014  Vaping Use  . Vaping Use: Never used  Substance Use Topics  . Alcohol use: No    Alcohol/week: 0.0 standard drinks  . Drug use: No    Home Medications Prior to Admission medications  Medication Sig Start Date End Date Taking? Authorizing Provider  acetaminophen (TYLENOL) 500 MG tablet Take 500-1,000 mg by mouth every 6 (six) hours as needed (for pain.).    [provider]  allopurinol (ZYLOPRIM) 100 MG tablet Take 100 mg by mouth daily.  02/08/19   [provider]  amiodarone (PACERONE) 200 MG tablet Take 1 tablet (200 mg total) by mouth daily. 04/13/20   Bensimhon, Shaune Pascal, MD  apixaban (ELIQUIS) 5 MG TABS tablet Take 1 tablet (5 mg total) by mouth 2 (two) times daily. 04/17/20   [name], MD  Ascorbic Acid (VITAMIN C) 100 MG tablet Take 100 mg by mouth daily.    [provider]  atorvastatin (LIPITOR)  40 MG tablet TAKE 1 TABLET (40 MG TOTAL) BY MOUTH DAILY AT 6 PM. Patient taking differently: Take 40 mg by mouth at bedtime.  09/14/19   [name], MD  carvedilol (COREG) 3.125 MG tablet Take 1 tablet (3.125 mg total) by mouth 2 (two) times daily. 05/21/20   Lyda Jester M, PA-C  cephALEXin (KEFLEX) 500 MG capsule Take 500 mg by mouth 3 (three) times daily.  Patient not taking: Reported on 06/07/2020 12/07/19   [provider]  Cholecalciferol (VITAMIN D3 SUPER STRENGTH) 50 MCG (2000 UT) TABS Take 2,000 Units by mouth at bedtime.     [provider]  CVS D3 50 MCG (2000 UT) CAPS Take 1 capsule by mouth daily. Patient not taking: Reported on 06/07/2020 05/14/20   [provider]  cyanocobalamin 500 MCG tablet Take 500 mcg by mouth at bedtime.     [provider]  digoxin (LANOXIN) 0.125 MG tablet Take 125 mcg by mouth daily. Patient not taking: Reported on 06/07/2020 03/03/20   [provider]  DULoxetine (CYMBALTA) 30 MG capsule Take 30 mg by mouth every evening.     [provider]  ferrous sulfate 325 (65 FE) MG tablet Take 1 tablet (325 mg total) by mouth daily with breakfast. Patient taking differently: Take 325 mg by mouth daily.  02/09/18   [name], MD  gabapentin (NEURONTIN) 300 MG capsule Take 300 mg by mouth at bedtime.     [provider]  ketoconazole (NIZORAL) 2 % shampoo Apply 1 application topically 2 (two) times a week.  09/28/18   [provider]  metolazone (ZAROXOLYN) 2.5 MG tablet Take 1 tablet (2.5 mg total) by mouth once a week. Take 20 mEq Potassium with Metolazone. Patient taking differently: Take 2.5 mg by mouth once a week. Take 20 mEq Potassium with Metolazone. Taking on Fridays 05/30/20 08/28/20  [name], MD  Multiple Vitamin (MULTIVITAMIN) tablet Take 1 tablet by mouth at bedtime.     [provider]  pantoprazole (PROTONIX) 40 MG tablet TAKE 1 TABLET BY MOUTH EVERY DAY  12/08/19   [name], MD  potassium chloride SA (KLOR-CON M20) 20 MEQ tablet Take 1 tablet (20 mEq total) by mouth daily. 06/06/20   [name], MD  torsemide (DEMADEX) 20 MG tablet Take 4 tablets (80 mg total) by mouth 2 (two) times daily for 3 days, THEN 4 tablets (80 mg total) daily. 06/25/20 06/28/21  [name], MD    Allergies    Spironolactone  Review of Systems   Review of Systems  Constitutional: Positive for fatigue. Negative for chills, diaphoresis and fever.  HENT: Negative for congestion, rhinorrhea and sore throat.   Eyes: Negative for visual disturbance.  Respiratory: Negative  for cough, choking, chest tightness, shortness of breath and wheezing.   Cardiovascular: Negative for chest pain.  Gastrointestinal: Negative for abdominal pain, diarrhea, nausea and vomiting.  Genitourinary: Negative for dysuria, flank pain and frequency.  Musculoskeletal: Negative for back pain, neck pain and neck stiffness.  Skin: Negative for rash and wound.  Neurological: Positive for light-headedness. Negative for dizziness, seizures, loss of consciousness, speech difficulty, weakness and headaches.  Psychiatric/Behavioral: Positive for confusion. Negative for agitation.  All other systems reviewed and are negative.   Physical Exam Updated Vital Signs BP (!) 98/58   Pulse 68   Temp 97.8 F (36.6 C) (Oral)   Resp 16   Ht 6' (1.829 m)   Wt 77.1 kg   SpO2 100%   BMI 23.06 kg/m   Physical Exam Vitals and nursing note reviewed.  Constitutional:      General: He is not in acute distress.    Appearance: He is well-developed. He is not ill-appearing, toxic-appearing or diaphoretic.  HENT:     Head: Normocephalic and atraumatic.     Nose: No congestion or rhinorrhea.     Mouth/Throat:     Mouth: Mucous membranes are moist.     Pharynx: No oropharyngeal exudate or posterior oropharyngeal erythema.  Eyes:     Extraocular Movements: Extraocular movements intact.      Conjunctiva/sclera: Conjunctivae normal.     Pupils: Pupils are equal, round, and reactive to light.  Cardiovascular:     Rate and Rhythm: Normal rate and regular rhythm.     Pulses: Normal pulses.     Heart sounds: Murmur heard.   Pulmonary:     Effort: Pulmonary effort is normal. No respiratory distress.     Breath sounds: Rales present. No wheezing or rhonchi.  Chest:     Chest wall: No tenderness.  Abdominal:     General: Abdomen is flat.     Palpations: Abdomen is soft.     Tenderness: There is no abdominal tenderness. There is no right CVA tenderness, left CVA tenderness, guarding or rebound.  Musculoskeletal:        General: No tenderness.     Cervical back: Neck supple. No tenderness.     Right lower leg: Edema present.     Left lower leg: Edema present.  Skin:    General: Skin is warm and dry.     Capillary Refill: Capillary refill takes less than 2 seconds.     Coloration: Skin is pale.     Findings: No erythema.  Neurological:     General: No focal deficit present.     Mental Status: He is alert.     Sensory: No sensory deficit.     Motor: No weakness.  Psychiatric:        Mood and Affect: Mood normal.     ED Results / Procedures / Treatments   Labs (all labs ordered are listed, but only abnormal results are displayed) Labs Reviewed  COMPREHENSIVE METABOLIC PANEL - Abnormal; Notable for the following components:      Result Value   Sodium 127 (*)    Chloride 85 (*)    Glucose, Bld 116 (*)    BUN 89 (*)    Creatinine, Ser 3.51 (*)    Total Protein 6.1 (*)    Albumin 3.3 (*)    Total Bilirubin 1.3 (*)    GFR, Estimated 18 (*)    All other components within normal limits  CBC - Abnormal; Notable for the following  components:   RBC 2.06 (*)    Hemoglobin 6.5 (*)    HCT 21.0 (*)    MCV 101.9 (*)    RDW 15.7 (*)    All other components within normal limits  BRAIN NATRIURETIC PEPTIDE - Abnormal; Notable for the following components:   B Natriuretic  Peptide 1,583.2 (*)    All other components within normal limits  POC OCCULT BLOOD, ED - Abnormal; Notable for the following components:   Fecal Occult Bld POSITIVE (*)    All other components within normal limits  TROPONIN I (HIGH SENSITIVITY) - Abnormal; Notable for the following components:   Troponin I (High Sensitivity) 49 (*)    All other components within normal limits  RESPIRATORY PANEL BY RT PCR (FLU A&B, COVID)  HIV ANTIBODY (ROUTINE TESTING W REFLEX)  URINALYSIS, ROUTINE W REFLEX MICROSCOPIC  CBC  CBC  PROTIME-INR  TYPE AND SCREEN  PREPARE RBC (CROSSMATCH)  TROPONIN I (HIGH SENSITIVITY)    EKG EKG Interpretation  Date/Time:  Wednesday June 27 2020 11:30:27 EDT Ventricular Rate:  69 PR Interval:    QRS Duration: 190 QT Interval:  530 QTC Calculation: 568 R Axis:   -28 Text Interpretation: Sinus rhythm Multiform ventricular premature complexes Borderline prolonged PR interval Right bundle branch block When compared to priorm similar apperance with new PVC. No STEMI Confirmed by Antony Blackbird [phone]) on 06/27/2020 11:48:46 AM   Radiology DG Chest Portable 1 View  Result Date: 06/27/2020 CLINICAL DATA:  69 year old male with fatigue. Lower extremity swelling. EXAM: PORTABLE CHEST 1 VIEW COMPARISON:  Chest radiographs 03/03/2019 and earlier. FINDINGS: Portable AP semi upright view at 1210 hours. Chronic left chest cardiac AICD. Prior CABG. Stable cardiomegaly and mediastinal contours. Mildly lower lung volumes. Mild new veiling opacity at the lung bases suspicious for pleural effusion. Pulmonary vascular congestion similar to but increased from last year. No pneumothorax. No air bronchograms. Paucity of bowel gas in the upper abdomen. Chronic right rib fractures. No acute osseous abnormality identified. IMPRESSION: Suspect acute pulmonary interstitial edema with small bilateral pleural effusions. Chronic cardiomegaly, AICD. Electronically Signed   By: Genevie Ann M.D.   On:  06/27/2020 12:23    Procedures Procedures (including critical care time)  CRITICAL CARE Performed by: Gwenyth Allegra Taelyn Nemes Total critical care time: 35 minutes Critical care time was exclusive of separately billable procedures and treating other patients. Critical care was necessary to treat or prevent imminent or life-threatening deterioration. Critical care was time spent personally by me on the following activities: development of treatment plan with patient and/or surrogate as well as nursing, discussions with consultants, evaluation of patient's response to treatment, examination of patient, obtaining history from patient or surrogate, ordering and performing treatments and interventions, ordering and review of laboratory studies, ordering and review of radiographic studies, pulse oximetry and re-evaluation of patient's condition.   Medications Ordered in ED Medications  acetaminophen (TYLENOL) tablet 650 mg (has no administration in time range)    Or  acetaminophen (TYLENOL) suppository 650 mg (has no administration in time range)  pantoprazole (PROTONIX) 80 mg in sodium chloride 0.9 % 100 mL (0.8 mg/mL) infusion (has no administration in time range)  0.9 %  sodium chloride infusion (10 mL/hr Intravenous New Bag/Given 06/27/20 1249)  pantoprazole (PROTONIX) injection 40 mg (40 mg Intravenous Given 06/27/20 1249)    ED Course  I have reviewed the triage vital signs and the nursing notes.  Pertinent labs & imaging results that were available during my care of the  patient were reviewed by me and considered in my medical decision making (see chart for details).    MDM Rules/Calculators/A&P                          Carl [name] is a 69 y.o. male with a past medical history significant for CAD status post CABG, CHF status post AICD, prior DVT on Eliquis, prior GI bleeding from ulcers, anxiety, and poor hearing who presents at the direction of his PCP for evaluation of lab  abnormalities and fatigue.  Patient reports that for the last few days he has been more fatigued.  He reports his legs are more swollen bilaterally.  He reports been taking his medications.  He denies any chest pain, palpitations, shortness of breath, or syncope but does report lightheadedness and feeling very tired.  He denies any headaches or neck pain.  Denies any abdominal pain or back pain.  Denies any extremity pains.  Denies any trauma.  He denies any dark tarry stools or rectal bleeding to his knowledge.  Patient reportedly went to his GI team/PCP yesterday and had some blood work that was done and he was called today telling him that he was anemic and hyponatremic and needed to come to emergency department.  Patient does report he is felt more tired and confused.  On arrival, he is denying complaints aside from feeling tired.  His blood pressure is 91 on my exam.  He does have some concern for edema and fluid overload with swollen legs bilaterally and some crackles on the bases of his lungs on exam.  He looks very pale.  His rectal exam showed some dark stool which was sent for fecal occult testing.  His chest and abdomen were nontender he does have a systolic murmur.  Exam otherwise reassuring.  Clinical I am concerned that he is having GI bleed now that he is on blood thinners.  This may be worsening heart failure leading to the fluid in his legs and his lungs on exam.  We will get chest x-ray and labs.  We have a small mount of fluids for his hypotension however his oxygen saturations are normal on room air.  I anticipate he will need to get blood transfusion but will wait for blood work to return before ordering transfusion.  We will get a Covid swab for likely admission and other screening labs.  Anticipate admission for symptomatic anemia leading to soft pressures, fatigue, lightheadedness, and concern for worsened heart failure.  12:17 PM Hemoglobin returned at 6.5.  This resulted before  fluids were started.  We will cancel the fluids and give him blood as this is what he needs more.  Fecal occult test is positive, will order IV Protonix and call GI.  Awaiting rest of labs prior to admission for symptomatic anemia.  Patient appears to be a patient of the [facility].  Will call unassigned gastroenterology team today.    Final Clinical Impression(s) / ED Diagnoses Final diagnoses:  Symptomatic anemia  Gastrointestinal hemorrhage, unspecified gastrointestinal hemorrhage type  Fatigue, unspecified type  Lightheaded     Clinical Impression: 1. Symptomatic anemia   2. Gastrointestinal hemorrhage, unspecified gastrointestinal hemorrhage type   3. Fatigue, unspecified type   4. Lightheaded     Disposition: Admit  This note was prepared with assistance of Dragon voice recognition software. Occasional wrong-word or sound-a-like substitutions may have occurred due to the inherent limitations of voice recognition software.  Olamide Lahaie, Gwenyth Allegra, MD 06/27/20 1600

## 2020-06-27 NOTE — Plan of Care (Signed)
  Problem: Activity: Goal: Risk for activity intolerance will decrease Outcome: Progressing   Problem: Coping: Goal: Level of anxiety will decrease Outcome: Progressing   

## 2020-06-28 ENCOUNTER — Encounter (HOSPITAL_COMMUNITY)
Admission: EM | Disposition: A | Discharge status: Skilled Nursing Facility | Payer: Self-pay | Source: Home / Self Care | Attending: Internal Medicine

## 2020-06-28 ENCOUNTER — Encounter (HOSPITAL_COMMUNITY): Payer: Self-pay | Admitting: Internal Medicine

## 2020-06-28 ENCOUNTER — Inpatient Hospital Stay (HOSPITAL_COMMUNITY): Payer: Medicare Other | Admitting: Anesthesiology

## 2020-06-28 ENCOUNTER — Other Ambulatory Visit (HOSPITAL_COMMUNITY): Payer: Self-pay | Admitting: Cardiology

## 2020-06-28 HISTORY — PX: ESOPHAGOGASTRODUODENOSCOPY: SHX5428

## 2020-06-28 LAB — CBC
HCT: 25.4 % — ABNORMAL LOW (ref 39.0–52.0)
Hemoglobin: 8.2 g/dL — ABNORMAL LOW (ref 13.0–17.0)
MCH: 30.3 pg (ref 26.0–34.0)
MCHC: 32.3 g/dL (ref 30.0–36.0)
MCV: 93.7 fL (ref 80.0–100.0)
Platelets: 266 10*3/uL (ref 150–400)
RBC: 2.71 MIL/uL — ABNORMAL LOW (ref 4.22–5.81)
RDW: 17.8 % — ABNORMAL HIGH (ref 11.5–15.5)
WBC: 7.3 10*3/uL (ref 4.0–10.5)
nRBC: 0 % (ref 0.0–0.2)

## 2020-06-28 LAB — TYPE AND SCREEN
ABO/RH(D): O POS
Antibody Screen: NEGATIVE
Unit division: 0
Unit division: 0

## 2020-06-28 LAB — BASIC METABOLIC PANEL
Anion gap: 12 (ref 5–15)
BUN: 78 mg/dL — ABNORMAL HIGH (ref 8–23)
CO2: 27 mmol/L (ref 22–32)
Calcium: 9 mg/dL (ref 8.9–10.3)
Chloride: 89 mmol/L — ABNORMAL LOW (ref 98–111)
Creatinine, Ser: 3.07 mg/dL — ABNORMAL HIGH (ref 0.61–1.24)
GFR, Estimated: 21 mL/min — ABNORMAL LOW (ref >60)
Glucose, Bld: 109 mg/dL — ABNORMAL HIGH (ref 70–99)
Potassium: 3.3 mmol/L — ABNORMAL LOW (ref 3.5–5.1)
Sodium: 128 mmol/L — ABNORMAL LOW (ref 135–145)

## 2020-06-28 LAB — BPAM RBC
Blood Product Expiration Date: 202112042359
Blood Product Expiration Date: 202112042359
ISSUE DATE / TIME: 202111031244
ISSUE DATE / TIME: 202111031737
Unit Type and Rh: 5100
Unit Type and Rh: 5100

## 2020-06-28 LAB — PROTIME-INR
INR: 1.9 — ABNORMAL HIGH (ref 0.8–1.2)
Prothrombin Time: 20.8 seconds — ABNORMAL HIGH (ref 11.4–15.2)

## 2020-06-28 SURGERY — EGD (ESOPHAGOGASTRODUODENOSCOPY)
Anesthesia: Monitor Anesthesia Care

## 2020-06-28 MED ORDER — LACTULOSE 10 GM/15ML PO SOLN
20.0000 g | Freq: Every day | ORAL | Status: DC
  Administered 2020-06-28: 20 g via ORAL
  Filled 2020-06-28 (×2): qty 30

## 2020-06-28 MED ORDER — PHENYLEPHRINE 40 MCG/ML (10ML) SYRINGE FOR IV PUSH (FOR BLOOD PRESSURE SUPPORT)
PREFILLED_SYRINGE | INTRAVENOUS | Status: DC | PRN
  Administered 2020-06-28: 80 ug via INTRAVENOUS

## 2020-06-28 MED ORDER — KETAMINE HCL 50 MG/5ML IJ SOSY
PREFILLED_SYRINGE | INTRAMUSCULAR | Status: AC
  Filled 2020-06-28: qty 5

## 2020-06-28 MED ORDER — LACTULOSE 10 GM/15ML PO SOLN
20.0000 g | Freq: Every day | ORAL | Status: DC
  Administered 2020-06-28 – 2020-07-05 (×8): 20 g via ORAL
  Filled 2020-06-28 (×8): qty 30

## 2020-06-28 MED ORDER — ONDANSETRON HCL 4 MG/2ML IJ SOLN
INTRAMUSCULAR | Status: DC | PRN
  Administered 2020-06-28: 4 mg via INTRAVENOUS

## 2020-06-28 MED ORDER — KETAMINE HCL 10 MG/ML IJ SOLN
INTRAMUSCULAR | Status: DC | PRN
  Administered 2020-06-28: 20 mg via INTRAVENOUS
  Administered 2020-06-28 (×2): 10 mg via INTRAVENOUS

## 2020-06-28 MED ORDER — POTASSIUM CHLORIDE CRYS ER 20 MEQ PO TBCR
40.0000 meq | EXTENDED_RELEASE_TABLET | Freq: Once | ORAL | Status: AC
  Administered 2020-06-28: 40 meq via ORAL
  Filled 2020-06-28: qty 2

## 2020-06-28 MED ORDER — PANTOPRAZOLE SODIUM 40 MG PO TBEC
40.0000 mg | DELAYED_RELEASE_TABLET | Freq: Every day | ORAL | Status: DC
  Administered 2020-06-28 – 2020-07-05 (×8): 40 mg via ORAL
  Filled 2020-06-28 (×8): qty 1

## 2020-06-28 MED ORDER — SODIUM CHLORIDE 0.9 % IV SOLN
510.0000 mg | Freq: Once | INTRAVENOUS | Status: AC
  Administered 2020-06-28: 510 mg via INTRAVENOUS
  Filled 2020-06-28: qty 17

## 2020-06-28 MED ORDER — PROPOFOL 500 MG/50ML IV EMUL
INTRAVENOUS | Status: DC | PRN
  Administered 2020-06-28: 50 ug/kg/min via INTRAVENOUS

## 2020-06-28 MED ORDER — SODIUM CHLORIDE 0.9 % IV SOLN: INTRAVENOUS | Status: DC

## 2020-06-28 MED ORDER — LIDOCAINE 2% (20 MG/ML) 5 ML SYRINGE
INTRAMUSCULAR | Status: DC | PRN
  Administered 2020-06-28: 80 mg via INTRAVENOUS

## 2020-06-28 NOTE — Progress Notes (Signed)
Carl [name] 10:14 AM  Subjective: Patient is doing well without any new complaints other than a sore throat we rediscussed the procedure  Objective: Vital signs stable afebrile exam please see preassessment evaluation hemoglobin increased a little INR decreased a little decreased BUN and creatinine Assessment: Multiple medical problems including guaiac positive anemia  Plan: Okay to proceed with endoscopy with anesthesia assistance with further work-up and plans pending those findings  [facility] E  office [phone] After 5PM or if no answer call [phone]

## 2020-06-28 NOTE — Op Note (Signed)
[facility] Patient Name: Carl [name] Procedure Date : 06/28/2020 MRN: [id number] Attending MD: Clarene Essex , MD Date of Birth: 1951/07/04 CSN: [id number] Age: 69 Admit Type: Inpatient Procedure:                Upper GI endoscopy Indications:              Iron deficiency anemia secondary to chronic blood                            loss, Heme positive stool history of cirrhosis Providers:                Clarene Essex, MD, Erenest Rasher, RN, Cletis Athens,                            Technician, Lerry Paterson, CRNA Referring MD:              Medicines:                Propofol total dose 40 mg IV, 40 mg of IV ketamine Complications:            No immediate complications. Estimated Blood Loss:     Estimated blood loss: none. Procedure:                Pre-Anesthesia Assessment:                           - Prior to the procedure, a History and Physical                            was performed, and patient medications and                            allergies were reviewed. The patient's tolerance of                            previous anesthesia was also reviewed. The risks                            and benefits of the procedure and the sedation                            options and risks were discussed with the patient.                            All questions were answered, and informed consent                            was obtained. Prior Anticoagulants: The patient has                            taken Eliquis (apixaban), last dose was 2 days                            prior to procedure. ASA Grade Assessment: III - A  patient with severe systemic disease. After                            reviewing the risks and benefits, the patient was                            deemed in satisfactory condition to undergo the                            procedure.                           After obtaining informed consent, the endoscope was                             passed under direct vision. Throughout the                            procedure, the patient's blood pressure, pulse, and                            oxygen saturations were monitored continuously. The                            GIF-H190 ([id number]) Olympus gastroscope was                            introduced through the mouth, and advanced to the                            third part of duodenum. The upper GI endoscopy was                            accomplished without difficulty. The patient                            tolerated the procedure well. Scope In: Scope Out: Findings:      The larynx was normal.      A small hiatal hernia was present with a widely patent fibrous ring.      Mild portal hypertensive gastropathy was found in the cardia, in the       gastric fundus, in the gastric body and in the gastric antrum. I doubt       he had very early watermelon stomach of the antrum and he did have a       small nonworrisome nodule in the antrum there was one area of minimal       oozing of portal gastropathy in the fundus which stopped on its own with       washing and watching      The duodenal bulb, first portion of the duodenum, second portion of the       duodenum and third portion of the duodenum were normal.      The exam was otherwise without abnormality. Impression:               - Normal larynx.                           -  Small hiatal hernia.                           - Portal hypertensive gastropathy.                           - Normal duodenal bulb, first portion of the                            duodenum, second portion of the duodenum and third                            portion of the duodenum.                           - The examination was otherwise normal.                           - No specimens collected. Recommendation:           - Soft diet today.                           - Continue present medications. Reevaluate whether                            he truly  needs Eliquis or not in the face of an                            elevated INR and consider increasing beta-blocker a                            little and will use low-dose lactulose to see if                            any encephalopathy is playing a role with his                            lethargy                           - Return to GI clinic PRN. Can follow-up with his                            primary gastroenterologist in [place] to decide                            if repeat colonoscopy to reevaluate for AVMs versus                            capsule endoscopy for the same reason is needed                           - Telephone GI clinic if symptomatic PRN. Hopefully  patient can go home soon Procedure Code(s):        --- Professional ---                           [phone], Esophagogastroduodenoscopy, flexible,                            transoral; diagnostic, including collection of                            specimen(s) by brushing or washing, when performed                            (separate procedure) Diagnosis Code(s):        --- Professional ---                           K44.9, Diaphragmatic hernia without obstruction or                            gangrene                           K76.6, Portal hypertension                           K31.89, Other diseases of stomach and duodenum                           D50.0, Iron deficiency anemia secondary to blood                            loss (chronic)                           R19.5, Other fecal abnormalities CPT copyright 2019 American Medical Association. All rights reserved. The codes documented in this report are preliminary and upon coder review may  be revised to meet current compliance requirements. Clarene Essex, MD 06/28/2020 11:09:46 AM This report has been signed electronically. Number of Addenda: 0

## 2020-06-28 NOTE — Anesthesia Preprocedure Evaluation (Addendum)
Anesthesia Evaluation  Patient identified by MRN, date of birth, ID band Patient awake    Reviewed: Allergy & Precautions, H&P , NPO status , Patient's Chart, lab work & pertinent test results  Airway Mallampati: II  TM Distance: >3 FB Neck ROM: Full    Dental no notable dental hx. (+) Edentulous Upper, Edentulous Lower, Dental Advisory Given   Pulmonary neg pulmonary ROS, former smoker,    Pulmonary exam normal breath sounds clear to auscultation       Cardiovascular hypertension, Pt. on medications and Pt. on home beta blockers + CAD, + Past MI, + Peripheral Vascular Disease and +CHF  + Cardiac Defibrillator + Valvular Problems/Murmurs MR  Rhythm:Regular Rate:Normal     Neuro/Psych Anxiety Depression negative neurological ROS     GI/Hepatic Neg liver ROS, hiatal hernia, PUD, GERD  Medicated,  Endo/Other  negative endocrine ROS  Renal/GU Renal disease  negative genitourinary   Musculoskeletal  (+) Arthritis , Osteoarthritis,    Abdominal   Peds  Hematology  (+) Blood dyscrasia, anemia ,   Anesthesia Other Findings   Reproductive/Obstetrics negative OB ROS                            Anesthesia Physical Anesthesia Plan  ASA: IV  Anesthesia Plan: MAC   Post-op Pain Management:    Induction: Intravenous  PONV Risk Score and Plan: 1 and Propofol infusion  Airway Management Planned: Nasal Cannula  Additional Equipment:   Intra-op Plan:   Post-operative Plan:   Informed Consent: I have reviewed the patients History and Physical, chart, labs and discussed the procedure including the risks, benefits and alternatives for the proposed anesthesia with the patient or authorized representative who has indicated his/her understanding and acceptance.   Patient has DNR.  Discussed DNR with patient, Discussed DNR with power of attorney and Continue DNR.   Dental advisory given  Plan  Discussed with: CRNA  Anesthesia Plan Comments:        Anesthesia Quick Evaluation

## 2020-06-28 NOTE — Progress Notes (Signed)
Patient with history of cirrhosis, had asterixis on exam yesterday, and ammonia was elevated (60).  Patient less verbally responsive recently per brother.  Lactulose 20 g daily ordered.  Recommend titration for 2-3 soft bowel movements per day.  [facility] will follow.  Salley Slaughter, [place] [phone]

## 2020-06-28 NOTE — Evaluation (Addendum)
Physical Therapy Evaluation Patient Details Name: HIRAN LEARD MRN: [id number] DOB: 27-Apr-1951 Today's Date: 06/28/2020   History of Present Illness  69 year old male with past medical history of DVT on Eliquis, CHF status post AICD, CAD status post CABG, PUD, cirrhosis, bleeding angioectasia on colonoscopy 2018, who presents to the ED for chief complaint of fatigue. Pt found to have symptomatic anemia due to chronic blood loss.   Clinical Impression  Pt presents to PT with decr mobility due to weakness, decr activity tolerance and poor balance. Pt requiring assist for all mobility and lives alone. Feel pt could benefit from ST-SNF prior to return home.     Follow Up Recommendations SNF    Equipment Recommendations  None recommended by PT    Recommendations for Other Services       Precautions / Restrictions Precautions Precautions: Fall Precaution Comments: Has a shoe for L foot - old fracture that did not heal correctly Restrictions Weight Bearing Restrictions: No      Mobility  Bed Mobility Overal bed mobility: Needs Assistance Bed Mobility: Supine to Sit     Supine to sit: Min assist;HOB elevated     General bed mobility comments: Assist to bring legs off of bed and elevate trunk into sitting    Transfers Overall transfer level: Needs assistance Equipment used: Rolling walker (2 wheeled) Transfers: Sit to/from [facility] Sit to Stand: Mod assist Stand pivot transfers: Min assist       General transfer comment: Assist to bring hips up and for balance. Bed to chair with pivotal steps with assist for balance  Ambulation/Gait Ambulation/Gait assistance: Min assist Gait Distance (Feet): 10 Feet (5' forward and 5' backward) Assistive device: Rolling walker (2 wheeled) Gait Pattern/deviations: Step-through pattern;Decreased step length - right;Decreased step length - left;Trunk flexed Gait velocity: decr Gait velocity interpretation: <1.31  ft/sec, indicative of household ambulator General Gait Details: Assist for balance and support  Science writer    Modified Rankin (Stroke Patients Only)       Balance Overall balance assessment: Needs assistance Sitting-balance support: Bilateral upper extremity supported Sitting balance-Leahy Scale: Fair   Postural control: Posterior lean Standing balance support: Bilateral upper extremity supported Standing balance-Leahy Scale: Poor Standing balance comment: walker and min assist for static standing                             Pertinent Vitals/Pain Pain Assessment: Faces Faces Pain Scale: Hurts a little bit Pain Location: back Pain Descriptors / Indicators: Aching Pain Intervention(s): Monitored during session;Repositioned    Home Living Family/patient expects to be discharged to:: Private residence Living Arrangements: Alone Available Help at Discharge: Family Type of Home: House Home Access: Stairs to enter Entrance Stairs-Rails: Can reach both Entrance Stairs-Number of Steps: 3 Home Layout: One level Home Equipment: Shower seat;Grab bars - tub/shower;Cane - single point;Walker - 2 wheels      Prior Function Level of Independence: Independent with assistive device(s)         Comments: Family asssits with community mobility, home management and meal prep, meds. Uses cane or rolling walker     Hand Dominance   Dominant Hand: Left    Extremity/Trunk Assessment   Upper Extremity Assessment Upper Extremity Assessment: Defer to OT evaluation    Lower Extremity Assessment Lower Extremity Assessment: Generalized weakness    Cervical / Trunk Assessment Cervical / Trunk  Assessment: Kyphotic  Communication   Communication: HOH  Cognition Arousal/Alertness: Awake/alert Behavior During Therapy: WFL for tasks assessed/performed Overall Cognitive Status: Impaired/Different from baseline Area of Impairment: Problem  solving                             Problem Solving: Slow processing;Requires verbal cues        General Comments      Exercises     Assessment/Plan    PT Assessment Patient needs continued PT services  PT Problem List Decreased strength;Decreased activity tolerance;Decreased balance;Decreased mobility;Decreased cognition       PT Treatment Interventions DME instruction;Gait training;Functional mobility training;Therapeutic activities;Therapeutic exercise;Balance training;Patient/family education    PT Goals (Current goals can be found in the Care Plan section)  Acute Rehab PT Goals Patient Stated Goal: Get out of bed PT Goal Formulation: With patient/family Time For Goal Achievement: 07/12/20 Potential to Achieve Goals: Good    Frequency Min 3X/week   Barriers to discharge Decreased caregiver support lives alone    Co-evaluation               AM-PAC PT "6 Clicks" Mobility  Outcome Measure Help needed turning from your back to your side while in a flat bed without using bedrails?: A Little Help needed moving from lying on your back to sitting on the side of a flat bed without using bedrails?: A Little Help needed moving to and from a bed to a chair (including a wheelchair)?: A Lot Help needed standing up from a chair using your arms (e.g., wheelchair or bedside chair)?: A Lot Help needed to walk in hospital room?: A Little Help needed climbing 3-5 steps with a railing? : Total 6 Click Score: 14    End of Session Equipment Utilized During Treatment: Gait belt Activity Tolerance: Patient limited by fatigue Patient left: in chair;with call bell/phone within reach;with chair alarm set Nurse Communication: Mobility status PT Visit Diagnosis: Other abnormalities of gait and mobility (R26.89);Muscle weakness (generalized) (M62.81)    Time: 1550-1610 PT Time Calculation (min) (ACUTE ONLY): 20 min   Charges:   PT Evaluation $PT Eval Moderate  Complexity: [place] Pager [phone] Office [place] 06/28/2020, 4:56 PM

## 2020-06-28 NOTE — Progress Notes (Addendum)
Subjective:   Hospital day: 1  Overnight event: Posttransfusion hemoglobin 7.7.  Patient does not respond appropriately to 1 unit of red blood cells, likely still ongoing bleeding.  Patient is seen at bedside before going to his EGD.  He appears comfortable with no acute distress.  He still complaining of mild abdominal pain.  No chest pain or shortness of breath.  Objective:  Vital signs in last 24 hours: Vitals:   06/28/20 0321 06/28/20 0400 06/28/20 0500 06/28/20 0502  BP:  (!) 113/99  108/69  Pulse:  74 76 76  Resp:  20 16 (!) 21  Temp:    97.7 F (36.5 C)  TempSrc:    Oral  SpO2:      Weight: 75.8 kg     Height:       CBC Latest Ref Rng & Units 06/28/2020 06/27/2020 06/27/2020  WBC 4.0 - 10.5 K/uL 7.3 - 5.0  Hemoglobin 13.0 - 17.0 g/dL 8.2(L) 7.7(L) 7.4(L)  Hematocrit 39 - 52 % 25.4(L) 24.4(L) 23.8(L)  Platelets 150 - 400 K/uL 266 - 250   CMP Latest Ref Rng & Units 06/28/2020 06/27/2020 06/14/2020  Glucose 70 - 99 mg/dL 109(H) 116(H) 105(H)  BUN 8 - 23 mg/dL 78(H) 89(H) 49(H)  Creatinine 0.61 - 1.24 mg/dL 3.07(H) 3.51(H) 2.93(H)  Sodium 135 - 145 mmol/L 128(L) 127(L) 128(L)  Potassium 3.5 - 5.1 mmol/L 3.3(L) 3.6 3.8  Chloride 98 - 111 mmol/L 89(L) 85(L) 86(L)  CO2 22 - 32 mmol/L '27 28 27  ' Calcium 8.9 - 10.3 mg/dL 9.0 9.3 9.5  Total Protein 6.5 - 8.1 g/dL - 6.1(L) -  Total Bilirubin 0.3 - 1.2 mg/dL - 1.3(H) -  Alkaline Phos 38 - 126 U/L - 83 -  AST 15 - 41 U/L - 33 -  ALT 0 - 44 U/L - 22 -    Physical Exam Physical Exam Constitutional:      General: He is not in acute distress.    Appearance: He is not toxic-appearing.  HENT:     Head: Normocephalic.  Eyes:     General:        Right eye: No discharge.        Left eye: No discharge.  Cardiovascular:     Rate and Rhythm: Normal rate and regular rhythm.  Pulmonary:     Effort: Pulmonary effort is normal. No respiratory distress.  Abdominal:     General: Bowel sounds are normal.     Palpations: Abdomen is  soft.     Tenderness: There is abdominal tenderness (Mild tenderness to palpation of the lower abdomens bilaterally).  Musculoskeletal:     Cervical back: Normal range of motion.     Right lower leg: Edema (+2) present.     Left lower leg: Edema (+2) present.  Skin:    General: Skin is warm.  Neurological:     Mental Status: He is alert.  Psychiatric:        Mood and Affect: Mood normal.     Assessment/Plan: Tegh Franek is a 69 year old male with past medical history of DVT on Eliquis, CHF status post AICD, CAD status post CABG, PUD, cirrhosis, bleeding angioectasia on colonoscopy 2018, who was admitted for symptomatic anemia secondary to GI bleed.  Principal Problem:   Symptomatic anemia Active Problems:   AKI (acute kidney injury) ([place])   Acute on chronic systolic CHF (congestive heart failure) (HCC)   Cardiomyopathy, ischemic   Essential hypertension   Depression   CKD (chronic  kidney disease), stage III ([place])   ICD (implantable cardioverter-defibrillator) in place   Persistent atrial fibrillation (HCC)   Cirrhosis ([place])   Hyponatremia   GI bleed  Symptomatic anemia Patient presents to the ED with fatigue, hemoglobin of 6.5 with positive FOBT.  Suspecting GI bleed in the setting of Eliquis use.  Patient does have a history of iron deficiency is anemia due to chronic blood loss (iron study on 06/26/2020 shows ferritin 17, iron of 10, and iron saturation of 2%).  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  Upper EGD 2018 only shows reflux gastritis.  Hemoglobin improved to 7.7 status post 2 units of packed red blood cells.  His systolic blood pressure is stable around 99-110, which is his baseline.  Upper EGD this morning only remarkable for portal hypertensive gastropathy, no signs of bleeding.  GI recommended repeat outpatient colonoscopy with his primary gastroenterologist at [place] drip -CBC every 8 hours -Continue hold  Eliquis -Give Feraheme due to low iron saturation and ferritin    Nonalcoholic liver cirrhosis Hepatic encephalopathy Functional decline Have history of liver cirrhosis, no history of alcohol.  FAP was within normal limits.  AST, ALT, alk phos within normal limits.  Total bill mildly elevated 1.3.  Abdomen is not distended, low suspicion for ascites.  GI recommended starting lactulose 20 g daily given elevated ammonia of 60 with asterixis on exam.  This elevated ammonia may contribute to his fatigue and functional decline.   -Monitor mental status -Lactulose 20 g daily   Congestive heart failure with reduced ejection fraction Echocardiogram in March 2021 shows EF 20 to 25%, global hypokinesis of LV, status post mitral valve repair.  Patient's lower extremity edema has worsened in the last 2-3 weeks.  Chest x-ray shows bilateral pleural effusion.  BNP is elevated at 1500.  Low suspicion for an acute exacerbation of his CHF.  This is likely due to his anemia causing demand ischemia.  Currently holding diuretics due to worsening kidney function, hypotension and normal respiratory status.  Patient currently satting at 97% on room air.  No signs of respiratory distress or orthopnea. -Strict I&O -Daily weights -Will resume low-dose of diuretics if patient becomes dyspneic    Chronic kidney disease stage III Has baseline creatinine is around 2.6 in the last month.    Creatinine improved to 3.07 status post blood transfusion.  This elevated creatinine is likely prerenal secondary to GI bleed. Currently holding diuretics.  Holding fluids given his worsening lower extremity edema and pleural effusion. -BMP in a.m. -Avoid nephrotoxic medications   Hyponatremia Patient has chronic low sodium level dates back in 2020.  His baseline sodium is around 130.  Etiology is likely poor p.o. intake versus SIADH. -BMP in a.m.   Atrial fibrillation Restart amiodarone Holding Eliquis  Diet: Soft  diet IVF: No VTE: SCDs due to GI bleed CODE: DNR  Prior to Admission Living Arrangement: Home Anticipated Discharge Location: To be determined Barriers to Discharge: Mental status Dispo: Anticipated discharge in approximately 2-3 day(s).   Gaylan Gerold, DO 06/28/2020, 6:29 AM Pager: [phone] After 5pm on weekdays and 1pm on weekends: On Call pager [phone]

## 2020-06-28 NOTE — Anesthesia Postprocedure Evaluation (Signed)
Anesthesia Post Note  Patient: Carl [name]  Procedure(s) Performed: ESOPHAGOGASTRODUODENOSCOPY (EGD) (N/A )     Patient location during evaluation: Endoscopy Anesthesia Type: MAC Level of consciousness: awake and alert Pain management: pain level controlled Vital Signs Assessment: post-procedure vital signs reviewed and stable Respiratory status: spontaneous breathing, nonlabored ventilation, respiratory function stable and patient connected to nasal cannula oxygen Cardiovascular status: stable and blood pressure returned to baseline Postop Assessment: no apparent nausea or vomiting Anesthetic complications: no   No complications documented.  Last Vitals:  Vitals:   06/28/20 1107 06/28/20 1133  BP: 92/62 99/64  Pulse: 64 66  Resp: 17 20  Temp: 37.2 C 37 C  SpO2: 94% 97%    Last Pain:  Vitals:   06/28/20 1133  TempSrc: Oral  PainSc:                  Zaynah Chawla,W. EDMOND

## 2020-06-28 NOTE — Transfer of Care (Signed)
Immediate Anesthesia Transfer of Care Note  Patient: Carl [name]  Procedure(s) Performed: ESOPHAGOGASTRODUODENOSCOPY (EGD) (N/A )  Patient Location: Endoscopy Unit  Anesthesia Type:MAC  Level of Consciousness: awake  Airway & Oxygen Therapy: Patient Spontanous Breathing and Patient connected to nasal cannula oxygen  Post-op Assessment: Report given to RN and Post -op Vital signs reviewed and stable  Post vital signs: Reviewed and stable  Last Vitals:  Vitals Value Taken Time  BP    Temp    Pulse 71 06/28/20 1059  Resp 15 06/28/20 1059  SpO2 100 % 06/28/20 1059  Vitals shown include unvalidated device data.  Last Pain:  Vitals:   06/28/20 1015  TempSrc: Oral  PainSc: 7          Complications: No complications documented.

## 2020-06-28 NOTE — Progress Notes (Signed)
Date: 06/28/2020  Patient name: Carl [name]  Medical record number: [id number]  Date of birth: 1951-03-06   I have seen and evaluated Carl [name] and discussed their care with the Residency Team.  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  History obtained from chart.  Over the last week he has noted progressive fatigue and has been having balance issues and multiple falls.  He also had decreased oral intake over the last few days.  Patient is unsure whether he had bloody or black bowel movements.  Patient has also noted worsening lower extremity swelling in the last 2 to 3 weeks.  He lives alone and manages his own medications.  Patient also complained of associated abdominal pain over the last week.  He went to see his gastroenterologist and was found to have a low hemoglobin and was sent to the ED for further evaluation.  No chest pain, no shortness of breath, no palpitations, no syncope, no focal weakness, no tingling or numbness, no headache, no blurry vision, no fevers or chills, no nausea or vomiting, no diarrhea.  Patient states that he feels well today and denies any new complaints.  He still has some mild abdominal pain but this is improved.  PMHx, Fam Hx, and/or Soc Hx : As per resident admit note  Vitals:   06/28/20 1107 06/28/20 1133  BP: 92/62 99/64  Pulse: 64 66  Resp: 17 20  Temp: 98.9 F (37.2 C) 98.6 F (37 C)  SpO2: 94% 97%   General: Awake, alert, NAD CVS: Regular rate and rhythm, normal heart sounds Lungs: CTA bilaterally Abdomen: Mild tenderness noted over his lower abdomen, no rebound/no guarding, nondistended, normoactive bowel sounds Extremities: 2+ bilateral lower extremity pitting edema noted on exam,  nontender to palpation Psych: Normal mood and affect HEENT: Normocephalic, atraumatic Skin: Warm and dry  Assessment and Plan: I have seen and evaluated the patient as outlined above. I agree with the formulated Assessment and Plan as detailed in the residents' note, with the following changes:   1.  Symptomatic anemia likely secondary to chronic blood loss: -Patient presented to the ED with worsening fatigue over the last week and was found to have a hemoglobin of 6.5 likely secondary to a GI bleed in the setting of being on Eliquis and history of cirrhosis with an elevated INR.  Patient has not had further bleeding here -Patient is status post 2 units PRBC.  Hemoglobin is slowly improving and is up to over 8 today -We will continue with Protonix for now -Continue to hold anticoagulation.  Will not restart this at this time given patient's elevated INR and GI bleed -Patient was also noted to have iron deficiency on outpatient labs.  Will give Feraheme here -GI follow-up and recommendations appreciated.  Patient is status post EGD today which showed portal hypertensive gastropathy but no active bleed -Patient to follow-up with his gastroenterologist as an outpatient to decide if he needs repeat colonoscopy to look for bleeding AVMs or if he would require a capsule endoscopy -Patient was also started on lactulose given his history of cirrhosis for possible mild encephalopathy contributing to his fatigue and falls.  Will need to titrate to 2-3 bowel movements per day -No further work-up at this time.  We will continue to monitor CBC today.  If patient remains stable he should be stable for DC home tomorrow  2.  Acute on chronic systolic heart failure exacerbation: -Patient has had worsening lower extremity edema over the last 2 to 3 weeks and chest x-ray shows pulmonary edema with small bilateral pleural effusions -Patient diuresis currently on hold secondary to his GI bleed and borderline blood  pressures. -Patient denies any respiratory symptoms at this time and has O2 sats in the 90s on room air -We will consider resuming diuretics in a.m. if patient's hemoglobin remained stable -We will continue to monitor strict I's and O's and daily weights -No further work-up at this time  3.  AKI on CKD stage IIIb: -Patient was noted to have a baseline creatinine of approximately 2.6 and had a presenting creatinine of 3.5 on this admission.  Patient's creatinine has slowly improved to 3.07 -I suspect that the patient's AKI is likely secondary to volume depletion and decreased renal perfusion from his ongoing GI bleed -We will continue to monitor his creatinine closely and hold his diuretics for now -No further work-up at this time  Aldine Contes, MD 11/4/20212:18 PM

## 2020-06-28 NOTE — Evaluation (Signed)
Occupational Therapy Evaluation Patient Details Name: Carl [name] MRN: [id number] DOB: November 23, 1950 Today's Date: 06/28/2020    History of Present Illness 69 year old male with past medical history of DVT on Eliquis, CHF status post AICD, CAD status post CABG, PUD, cirrhosis, bleeding angioectasia on colonoscopy 2018, who presents to the ED for chief complaint of fatigue.   Clinical Impression   Patient admitted for the above diagnosis.  Currently presents with poor stand balance, low back discomfort, declines to complex thought and problem solving, decreased activity tolerance, fatigue, and generalized weakness.  All of the aforementioned are currently limiting his independence compared to his stated prior level of function.  At home he is alone with daily check-ins from his brother and sister.  Family assists with community mobility, meds, home management and heavy meal prep.  He is able to complete light home management like washing dishes, fixes his own breakfast, and is able to bathe and dress himself.  He has been using his 2WRW more for basic mobility.  OT will follow in the acute setting, but SNF for short term rehab would be beneficial prior to transitioning home, as he will not have 24 hour assist as needed.      Follow Up Recommendations  SNF;Supervision/Assistance - 24 hour    Equipment Recommendations  3 in 1 bedside commode    Recommendations for Other Services       Precautions / Restrictions Precautions Precautions: Fall Precaution Comments: Has a shoe for L foot - old fracture that did not heal correctly Restrictions Weight Bearing Restrictions: No      Mobility Bed Mobility Overal bed mobility: Needs Assistance Bed Mobility: Supine to Sit     Supine to sit: Min assist;HOB elevated          Transfers Overall transfer level: Needs assistance Equipment used: Rolling walker (2 wheeled) Transfers: Sit to/from [facility] Sit to Stand: Mod  assist Stand pivot transfers: Min assist            Balance Overall balance assessment: Needs assistance Sitting-balance support: Bilateral upper extremity supported Sitting balance-Leahy Scale: Fair   Postural control: Posterior lean Standing balance support: Bilateral upper extremity supported Standing balance-Leahy Scale: Poor                             ADL either performed or assessed with clinical judgement   ADL Overall ADL's : Needs assistance/impaired     Grooming: Wash/dry hands;Wash/dry face;Set up;Bed level               Lower Body Dressing: Moderate assistance;Sitting/lateral leans               Functional mobility during ADLs: Minimal assistance       Vision Baseline Vision/History: Wears glasses Wears Glasses: At all times Patient Visual Report: No change from baseline Additional Comments: Sister states he does not see well out of his R eye.  Unsure of reason.     Perception     Praxis      Pertinent Vitals/Pain Pain Assessment: Faces Faces Pain Scale: Hurts a little bit Pain Location: back Pain Descriptors / Indicators: Aching Pain Intervention(s): Monitored during session;Repositioned     Hand Dominance Left   Extremity/Trunk Assessment Upper Extremity Assessment Upper Extremity Assessment: Overall WFL for tasks assessed   Lower Extremity Assessment Lower Extremity Assessment: Defer to PT evaluation   Cervical / Trunk Assessment Cervical / Trunk Assessment: Kyphotic  Communication Communication Communication: HOH   Cognition Arousal/Alertness: Awake/alert Behavior During Therapy: WFL for tasks assessed/performed Overall Cognitive Status: Impaired/Different from baseline Area of Impairment: Safety/judgement                                                    Home Living Family/patient expects to be discharged to:: Private residence Living Arrangements: Alone Available Help at  Discharge: Family Type of Home: House Home Access: Stairs to enter Technical brewer of Steps: 3 Entrance Stairs-Rails: Can reach both [place]: One level     Bathroom Shower/Tub: [place]: Environmental consultant - standard;Shower seat;Grab bars - tub/shower;Cane - single point          Prior Functioning/Environment Level of Independence: Independent with assistive device(s)        Comments: Family asssits with community mobility, home management and meal prep, meds.        OT Problem List: Decreased strength;Decreased activity tolerance;Impaired balance (sitting and/or standing);Decreased safety awareness;Decreased cognition      OT Treatment/Interventions: Self-care/ADL training;Therapeutic exercise;DME and/or AE instruction;Therapeutic activities;Cognitive remediation/compensation;Balance training    OT Goals(Current goals can be found in the care plan section) Acute Rehab OT Goals Patient Stated Goal: Stated in wanted to get out of the bed OT Goal Formulation: With patient Time For Goal Achievement: 07/12/20 Potential to Achieve Goals: Good  OT Frequency: Min 2X/week   Barriers to D/C: Decreased caregiver support          Co-evaluation              AM-PAC OT "6 Clicks" Daily Activity     Outcome Measure Help from another person eating meals?: None Help from another person taking care of personal grooming?: A Little Help from another person toileting, which includes using toliet, bedpan, or urinal?: A Lot Help from another person bathing (including washing, rinsing, drying)?: A Lot Help from another person to put on and taking off regular upper body clothing?: A Little Help from another person to put on and taking off regular lower body clothing?: A Lot 6 Click Score: 16   End of Session Equipment Utilized During Treatment: Gait belt;Rolling walker  Activity Tolerance: Patient limited by fatigue Patient left: in chair;with call  bell/phone within reach;with chair alarm set  OT Visit Diagnosis: Unsteadiness on feet (R26.81)                Time: 6553-7482 OT Time Calculation (min): 21 min Charges:  OT General Charges $OT Visit: 1 Visit OT Evaluation $OT Eval Moderate Complexity: 1 Mod  06/28/2020  Rich, OTR/L  Acute Rehabilitation Services  Office:  [place] 06/28/2020, 4:10 PM

## 2020-06-29 ENCOUNTER — Telehealth: Payer: Self-pay | Admitting: *Deleted

## 2020-06-29 ENCOUNTER — Other Ambulatory Visit (HOSPITAL_COMMUNITY): Payer: Medicare Other

## 2020-06-29 DIAGNOSIS — L8990 Pressure ulcer of unspecified site, unspecified stage: Secondary | ICD-10-CM | POA: Insufficient documentation

## 2020-06-29 LAB — BASIC METABOLIC PANEL
Anion gap: 10 (ref 5–15)
BUN: 65 mg/dL — ABNORMAL HIGH (ref 8–23)
CO2: 30 mmol/L (ref 22–32)
Calcium: 9.2 mg/dL (ref 8.9–10.3)
Chloride: 94 mmol/L — ABNORMAL LOW (ref 98–111)
Creatinine, Ser: 2.67 mg/dL — ABNORMAL HIGH (ref 0.61–1.24)
GFR, Estimated: 25 mL/min — ABNORMAL LOW (ref >60)
Glucose, Bld: 108 mg/dL — ABNORMAL HIGH (ref 70–99)
Potassium: 3.4 mmol/L — ABNORMAL LOW (ref 3.5–5.1)
Sodium: 134 mmol/L — ABNORMAL LOW (ref 135–145)

## 2020-06-29 LAB — CBC
HCT: 26.2 % — ABNORMAL LOW (ref 39.0–52.0)
Hemoglobin: 8.2 g/dL — ABNORMAL LOW (ref 13.0–17.0)
MCH: 30.5 pg (ref 26.0–34.0)
MCHC: 31.3 g/dL (ref 30.0–36.0)
MCV: 97.4 fL (ref 80.0–100.0)
Platelets: 268 10*3/uL (ref 150–400)
RBC: 2.69 MIL/uL — ABNORMAL LOW (ref 4.22–5.81)
RDW: 18 % — ABNORMAL HIGH (ref 11.5–15.5)
WBC: 6.5 10*3/uL (ref 4.0–10.5)
nRBC: 0.5 % — ABNORMAL HIGH (ref 0.0–0.2)

## 2020-06-29 MED ORDER — CARVEDILOL 3.125 MG PO TABS: 3.1250 mg | ORAL_TABLET | Freq: Every day | ORAL | Status: DC

## 2020-06-29 MED ORDER — ALUM & MAG HYDROXIDE-SIMETH 200-200-20 MG/5ML PO SUSP
30.0000 mL | Freq: Once | ORAL | Status: AC
  Administered 2020-06-29: 30 mL via ORAL
  Filled 2020-06-29: qty 30

## 2020-06-29 MED ORDER — POTASSIUM CHLORIDE CRYS ER 20 MEQ PO TBCR
40.0000 meq | EXTENDED_RELEASE_TABLET | Freq: Once | ORAL | Status: AC
  Administered 2020-06-29: 40 meq via ORAL
  Filled 2020-06-29: qty 2

## 2020-06-29 MED ORDER — SUCRALFATE 1 G PO TABS
1.0000 g | ORAL_TABLET | Freq: Two times a day (BID) | ORAL | Status: DC
  Administered 2020-06-29 – 2020-07-05 (×13): 1 g via ORAL
  Filled 2020-06-29 (×13): qty 1

## 2020-06-29 MED ORDER — NITROGLYCERIN 0.4 MG SL SUBL
SUBLINGUAL_TABLET | SUBLINGUAL | Status: AC
  Filled 2020-06-29: qty 1

## 2020-06-29 MED ORDER — CARVEDILOL 3.125 MG PO TABS
3.1250 mg | ORAL_TABLET | Freq: Two times a day (BID) | ORAL | Status: DC
  Administered 2020-06-29 – 2020-07-05 (×12): 3.125 mg via ORAL
  Filled 2020-06-29 (×12): qty 1

## 2020-06-29 MED ORDER — TORSEMIDE 20 MG PO TABS
80.0000 mg | ORAL_TABLET | Freq: Every day | ORAL | Status: DC
  Administered 2020-06-29 – 2020-07-05 (×7): 80 mg via ORAL
  Filled 2020-06-29 (×7): qty 4

## 2020-06-29 MED ORDER — LIDOCAINE VISCOUS HCL 2 % MT SOLN
15.0000 mL | Freq: Once | OROMUCOSAL | Status: AC
  Administered 2020-06-29: 15 mL via ORAL
  Filled 2020-06-29: qty 15

## 2020-06-29 NOTE — Telephone Encounter (Signed)
Mr [facility] care giver came in the office stating they had lost the prescription that Liliane Channel gave them for [employer].  I wrote him another prescription to have a 3/4 lift added to the patient's left shoe.

## 2020-06-29 NOTE — Progress Notes (Signed)
[name] 9:51 AM  Subjective: Patient without any new complaints and still with some dark stools but eating okay and having a little cough and sore throat still  Objective: Vital signs stable afebrile abdomen is soft nontender hemoglobin stable BUN and creatinine decreased  Assessment: Chronic GI blood loss and patient on blood thinners with increased INR from his liver  Plan: Please see yesterday's endoscopy note for my recommendations and if he continues to have signs of bleeding consider either capsule endoscopy or colonoscopy which could be done as an outpatient by his primary gastroenterologist in [place] otherwise continue Carafate lactulose and try to increase beta-blockers a little and reevaluate whether he truly needs any additional blood thinners or not and please call my rounding partner this weekend if any question or problem from a GI standpoint  [place]  office [phone] After 5PM or if no answer call [phone]

## 2020-06-29 NOTE — Progress Notes (Signed)
Physical Therapy Treatment Patient Details Name: Carl [name] MRN: [id number] DOB: May 24, 1951 Today's Date: 06/29/2020    History of Present Illness 69 year old male with past medical history of DVT on Eliquis, CHF status post AICD, CAD status post CABG, PUD, cirrhosis, bleeding angioectasia on colonoscopy 2018, who presents to the ED for chief complaint of fatigue. Pt found to have symptomatic anemia due to chronic blood loss.     PT Comments    Patient is progressing towards physical therapy. Progressed ambulation distance this session. Patient able to ambulate 25' with RW and minA, assist for balance and RW management. Patient continues to require minA for bed mobility and transfers due balance deficits and generalized weakness. Patient continues to be limited by impaired balance, decreased activity tolerance, generalized weakness, and impaired functional mobility. Continue to recommend SNF for ongoing Physical Therapy.     Follow Up Recommendations  SNF     Equipment Recommendations  None recommended by PT    Recommendations for Other Services       Precautions / Restrictions Precautions Precautions: Fall    Mobility  Bed Mobility Overal bed mobility: Needs Assistance Bed Mobility: Supine to Sit     Supine to sit: Min assist     General bed mobility comments: assist to elevate trunk and bring hips forward to EOB  Transfers Overall transfer level: Needs assistance Equipment used: Rolling [place]a [place] (2 wheeled) Transfers: Sit to/from Stand Sit to Stand: Min assist         General transfer comment: assist for power up and balance, verbal/tactile cues for equal WB on B LEs and upright posture in standing  Ambulation/Gait Ambulation/Gait assistance: Min assist Gait Distance (Feet): 25 Feet Assistive device: Rolling Kalifa [place] (2 wheeled) Gait Pattern/deviations: Step-through pattern;Decreased stride length;Trunk flexed Gait velocity: decreased Gait velocity  interpretation: <1.31 ft/sec, indicative of household ambulator General Gait Details: assist for RW management, cues for maintaining close proximity to [employer]    Modified Rankin (Stroke Patients Only)       Balance Overall balance assessment: Needs assistance Sitting-balance support: Feet supported;No upper extremity supported Sitting balance-Leahy Scale: Fair   Postural control: Posterior lean Standing balance support: Bilateral upper extremity supported Standing balance-Leahy Scale: Poor Standing balance comment: Rw and min assist for static standing                            Cognition Arousal/Alertness: Awake/alert Behavior During Therapy: WFL for tasks assessed/performed Overall Cognitive Status: Impaired/Different from baseline Area of Impairment: Problem solving                             Problem Solving: Slow processing;Requires verbal cues;Requires tactile cues        Exercises      General Comments General comments (skin integrity, edema, etc.): VSS stable      Pertinent Vitals/Pain Pain Assessment: Faces Faces Pain Scale: Hurts a little bit Pain Location:  (did not assess, grimacing during ambulation) Pain Descriptors / Indicators: Grimacing Pain Intervention(s): Monitored during session;Repositioned    Home Living                      Prior Function            PT Goals (current goals can now be found in the care plan  section) Acute Rehab PT Goals PT Goal Formulation: With patient Time For Goal Achievement: 07/12/20 Potential to Achieve Goals: Good Progress towards PT goals: Progressing toward goals    Frequency    Min 3X/week      PT Plan Current plan remains appropriate    Co-evaluation              AM-PAC PT "6 Clicks" Mobility   Outcome Measure  Help needed turning from your back to your side while in a flat bed without using bedrails?: A  Little Help needed moving from lying on your back to sitting on the side of a flat bed without using bedrails?: A Little Help needed moving to and from a bed to a chair (including a wheelchair)?: A Lot Help needed standing up from a chair using your arms (e.g., wheelchair or bedside chair)?: A Little Help needed to walk in hospital room?: A Little Help needed climbing 3-5 steps with a railing? : A Lot 6 Click Score: 16    End of Session Equipment Utilized During Treatment: Gait belt Activity Tolerance: Patient tolerated treatment well Patient left: in chair;with call bell/phone within reach;with chair alarm set Nurse Communication: Mobility status PT Visit Diagnosis: Other abnormalities of gait and mobility (R26.89);Muscle weakness (generalized) (M62.81)     Time: 4650-3546 PT Time Calculation (min) (ACUTE ONLY): 18 min  Charges:  $Therapeutic Activity: 8-22 mins                     Perrin Maltese, PT, DPT Acute Rehabilitation Services Pager [phone] Office [phone]    Carl [name] 06/29/2020, 11:02 AM

## 2020-06-29 NOTE — Progress Notes (Addendum)
Subjective:   Hospital day: 2   Overnight event: No acute event overnight  Patient is alert and awake today.  He is oriented x3 and able to answer question appropriately.  He complains of mild sore throat otherwise denies pain anywhere.  Objective:  Vital signs in last 24 hours: Vitals:   06/28/20 1133 06/28/20 2055 06/29/20 0151 06/29/20 0222  BP: 99/64 97/75    Pulse: 66 77    Resp: _0 Temp: 98.6 F (37 C) 98.8 F (37.1 C) 98.4 F (36.9 C)   TempSrc: Oral Oral Oral   SpO2: 97% 94%    Weight:    76.2 kg  Height:       CBC Latest Ref Rng & Units 06/29/2020 06/28/2020 06/27/2020  WBC 4.0 - 10.5 K/uL 6.5 7.3 -  Hemoglobin 13.0 - 17.0 g/dL 8.2(L) 8.2(L) 7.7(L)  Hematocrit 39 - 52 % 26.2(L) 25.4(L) 24.4(L)  Platelets 150 - 400 K/uL 268 266 -   BMP Latest Ref Rng & Units 06/29/2020 06/28/2020 06/27/2020  Glucose 70 - 99 mg/dL 108(H) 109(H) 116(H)  BUN 8 - 23 mg/dL 65(H) 78(H) 89(H)  Creatinine 0.61 - 1.24 mg/dL 2.67(H) 3.07(H) 3.51(H)  BUN/Creat Ratio 10 - 24 - - -  Sodium 135 - 145 mmol/L 134(L) 128(L) 127(L)  Potassium 3.5 - 5.1 mmol/L 3.4(L) 3.3(L) 3.6  Chloride 98 - 111 mmol/L 94(L) 89(L) 85(L)  CO2 22 - 32 mmol/L _1 Calcium 8.9 - 10.3 mg/dL 9.2 9.0 9.3    Physical Exam Physical Exam Constitutional:      General: He is not in acute distress. HENT:     Head: Normocephalic.  Eyes:     General:        Right eye: No discharge.        Left eye: No discharge.  Cardiovascular:     Rate and Rhythm: Normal rate and regular rhythm.  Pulmonary:     Effort: No respiratory distress.     Breath sounds: Normal breath sounds.  Abdominal:     General: Bowel sounds are normal.     Palpations: Abdomen is soft.  Musculoskeletal:     Right lower leg: Edema (+2) present.     Left lower leg: Edema (+2) present.  Skin:    General: Skin is warm.  Neurological:     Mental Status: He is alert.  Psychiatric:        Mood and Affect: Mood normal.      Assessment/Plan: Carl [name] is a 69 year old male with past medical history of DVT on Eliquis, CHF status post AICD, CAD status post CABG, PUD, cirrhosis, bleedingangioectasia on colonoscopy 2018,whowas admitted for symptomatic anemia secondary to GI bleed.  Principal Problem:   Symptomatic anemia Active Problems:   AKI (acute kidney injury) ([place])   Acute on chronic systolic CHF (congestive heart failure) ([facility])   Cardiomyopathy, ischemic   Essential hypertension   Depression   CKD (chronic kidney disease), stage III ([place])   ICD (implantable cardioverter-defibrillator) in place   Persistent atrial fibrillation ([facility])   Cirrhosis ([facility])   Hyponatremia   GI bleed   Pressure injury of skin  Symptomatic anemia Patient presents to the ED with fatigue, hemoglobin of 6.5 with positive FOBT. Suspecting GI bleed in the setting of Eliquis use. Patient does have a history of iron deficiency is anemia due to chronic blood loss(iron study on 06/26/2020 shows ferritin 17, iron of 10, and iron saturation of 2%). Colonoscopy in  2018 shows a bleeding angiectasia which was treated with a clip. Upper EGD 2018 only shows reflux gastritis.  Upper EGD on this admission only shows portal hypertensive gastropathy with no signs of bleeding.  Hemoglobin improved to 8.2 status post Feraheme yesterday.  His systolic pressure is at baseline around 90s.  Continue sucralfate, Protonix and low-dose beta-blocker per GI recommendation.  He will need outpatient colonoscopy with his primary gastroenterologist at [facility]. -Protonix40 mg p.o. -Carafate 1 g twice daily -Carvedilol 3.125 mg twice daily  -CBC in a.m. -Continue hold Eliquis after discharge until he sees his primary GI    Nonalcoholicliver cirrhosis Hepatic encephalopathy Functional decline Have history of liver cirrhosis, no history of alcohol. FAP was within normal limits. AST,ALT,alk phos within normal limits. Total bill mildly  elevated 1.3. Abdomen is not distended, low suspicion for ascites.  GI recommended starting lactulose 20 g daily given elevated ammonia of 60 with asterixis on exam.  This elevated ammonia may contribute to his fatigue and functional decline.  His mental status improved significantly this morning likely due to the improvement of his hemoglobin. -Monitor mental status -Lactulose 20 g daily   Congestive heart failure with reduced ejection fraction Echocardiogram in March 2021 shows EF 20 to 25%, global hypokinesis of LV, status post mitral valve repair. Patient'slower extremity edema has worsened in the last 2-3 weeks. Chest x-ray shows bilateral pleural effusion.BNP is elevated at 1500. Low suspicion for an acute exacerbation of his CHF. This is likely due to his anemia causing demand ischemia. Currently holding diuretics due to worsening kidney function, hypotension and normal respiratory status.   Currently no signs of respiratory distress or orthopnea.  If fluid status is +400 cc.  Restart diuretics today and monitor his vital signs. -Strict I&O -Daily weights   Chronic kidney disease stage III Has baseline creatinine is around 2.6 in the last month.   Creatinine improved to 3.07 status post blood transfusion.  This elevated creatinine islikely prerenal secondary to GI bleed. His creatinine normalized to baseline.  Restart his diuretics. -BMP in a.m. -Avoid nephrotoxic medications   Hyponatremia Patient has chronic low sodium level dates back in 2020. His baseline sodium is around 130. Etiology is likely poor p.o. intake versus SIADH. -BMP in a.m.   Atrial fibrillation Restart amiodarone Holding Eliquis  Diet: Soft diet IVF: No VTE: SCDs due to GI bleed CODE: DNR  Prior to Admission Living Arrangement: Home Anticipated Discharge Location:  SNF Barriers to Discharge:  Bed placement Dispo: Anticipated discharge in approximately 2-3 day(s).   Gaylan Gerold,  DO 06/29/2020, 6:17 AM Pager: [phone] After 5pm on weekdays and 1pm on weekends: On Call pager [phone]

## 2020-06-30 DIAGNOSIS — I4819 Other persistent atrial fibrillation: Secondary | ICD-10-CM

## 2020-06-30 DIAGNOSIS — D649 Anemia, unspecified: Secondary | ICD-10-CM

## 2020-06-30 DIAGNOSIS — N179 Acute kidney failure, unspecified: Secondary | ICD-10-CM | POA: Diagnosis not present

## 2020-06-30 DIAGNOSIS — K7460 Unspecified cirrhosis of liver: Secondary | ICD-10-CM | POA: Diagnosis not present

## 2020-06-30 LAB — CBC
HCT: 25.8 % — ABNORMAL LOW (ref 39.0–52.0)
Hemoglobin: 7.9 g/dL — ABNORMAL LOW (ref 13.0–17.0)
MCH: 30 pg (ref 26.0–34.0)
MCHC: 30.6 g/dL (ref 30.0–36.0)
MCV: 98.1 fL (ref 80.0–100.0)
Platelets: 242 10*3/uL (ref 150–400)
RBC: 2.63 MIL/uL — ABNORMAL LOW (ref 4.22–5.81)
RDW: 17.3 % — ABNORMAL HIGH (ref 11.5–15.5)
WBC: 6.1 10*3/uL (ref 4.0–10.5)
nRBC: 0.3 % — ABNORMAL HIGH (ref 0.0–0.2)

## 2020-06-30 LAB — BASIC METABOLIC PANEL
Anion gap: 12 (ref 5–15)
BUN: 49 mg/dL — ABNORMAL HIGH (ref 8–23)
CO2: 28 mmol/L (ref 22–32)
Calcium: 8.8 mg/dL — ABNORMAL LOW (ref 8.9–10.3)
Chloride: 92 mmol/L — ABNORMAL LOW (ref 98–111)
Creatinine, Ser: 2.31 mg/dL — ABNORMAL HIGH (ref 0.61–1.24)
GFR, Estimated: 30 mL/min — ABNORMAL LOW (ref >60)
Glucose, Bld: 108 mg/dL — ABNORMAL HIGH (ref 70–99)
Potassium: 3.5 mmol/L (ref 3.5–5.1)
Sodium: 132 mmol/L — ABNORMAL LOW (ref 135–145)

## 2020-06-30 MED ORDER — GABAPENTIN 300 MG PO CAPS
300.0000 mg | ORAL_CAPSULE | Freq: Every day | ORAL | Status: DC
  Administered 2020-06-30 – 2020-07-04 (×5): 300 mg via ORAL
  Filled 2020-06-30 (×5): qty 1

## 2020-06-30 MED ORDER — FERROUS SULFATE 325 (65 FE) MG PO TABS
325.0000 mg | ORAL_TABLET | Freq: Every day | ORAL | Status: DC
  Administered 2020-06-30 – 2020-07-05 (×6): 325 mg via ORAL
  Filled 2020-06-30 (×6): qty 1

## 2020-06-30 MED ORDER — ATORVASTATIN CALCIUM 40 MG PO TABS
40.0000 mg | ORAL_TABLET | Freq: Every day | ORAL | Status: DC
  Administered 2020-06-30 – 2020-07-04 (×5): 40 mg via ORAL
  Filled 2020-06-30 (×5): qty 1

## 2020-06-30 NOTE — Progress Notes (Addendum)
   Subjective:   No significant overnight events. Looking much better this morning. Denies any complaints.  Objective:  Vital signs in last 24 hours: Vitals:   06/29/20 2047 06/29/20 2214 06/29/20 2300 06/30/20 0439  BP:  (!) 100/58 90/63 91/66   Pulse:  70 66   Resp: 16   16  Temp: 98.3 F (36.8 C)  98.2 F (36.8 C) 98.3 F (36.8 C)  TempSrc: Oral  Oral Oral  SpO2:  96% 94% 99%  Weight:    76.2 kg  Height:       CBC Latest Ref Rng & Units 06/30/2020 06/29/2020 06/28/2020  WBC 4.0 - 10.5 K/uL 6.1 6.5 7.3  Hemoglobin 13.0 - 17.0 g/dL 7.9(L) 8.2(L) 8.2(L)  Hematocrit 39 - 52 % 25.8(L) 26.2(L) 25.4(L)  Platelets 150 - 400 K/uL 242 268 266   BMP Latest Ref Rng & Units 06/30/2020 06/29/2020 06/28/2020  Glucose 70 - 99 mg/dL 108(H) 108(H) 109(H)  BUN 8 - 23 mg/dL 49(H) 65(H) 78(H)  Creatinine 0.61 - 1.24 mg/dL 2.31(H) 2.67(H) 3.07(H)  BUN/Creat Ratio 10 - 24 - - -  Sodium 135 - 145 mmol/L 132(L) 134(L) 128(L)  Potassium 3.5 - 5.1 mmol/L 3.5 3.4(L) 3.3(L)  Chloride 98 - 111 mmol/L 92(L) 94(L) 89(L)  CO2 22 - 32 mmol/L 28 30 27   Calcium 8.9 - 10.3 mg/dL 8.8(L) 9.2 9.0    Physical Exam General: chronically ill appearing Cardiac: regular rate, irregular rhythm. +1 LE edema Pulm: breathing comfortably on room air. 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  Principal Problem:   Symptomatic anemia Active Problems:   AKI (acute kidney injury) ([place])   Acute on chronic systolic CHF (congestive heart failure) (HCC)   Cardiomyopathy, ischemic   Essential hypertension   Depression   CKD (chronic kidney disease), stage III (HCC)   ICD (implantable cardioverter-defibrillator) in place   Persistent atrial fibrillation (HCC)    Cirrhosis (HCC)   Hyponatremia   GI bleed   Pressure injury of skin  Symptomatic anemia secondary to chronic GI bleed. -No bleeding noted on upper endoscopy.  -hgb 8.2>7.9 overnight -Will need to consider colonoscopy vs caps endoscopy if he continues to bleed. 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  -Continue lactulose.  -continue coreg. Increase if blood pressures allow.  HFrEF (EF 20-25%) s/p duel chamber AICD.  -Continue home toresemide.  -Daily weights. Strict I/O.  AKI (resolved) on Chronic kidney disease stage III -baseline creatinine is around 2.6 in the last month.  Plan: continue monintoring renal function intermittently. Avoid nephrotoxic medications  Chronic Hyponatremia stable  Atrial fibrillation CHADSVASC 3. Continue amiodarone. Hold eliquis. Would recommend this continue to be held at discharge due to recurrent GI bleeding.  Physical deconditioning. PT recommending SNF for ongoing rehabilitation. TOC on board to assist with placement.  Diet: Soft diet IVF: No VTE: SCDs due to GI bleed CODE: DNR  Prior to Admission Living Arrangement: Home Anticipated Discharge Location:  SNF Barriers to Discharge:  Bed placement Dispo: Anticipated discharge in approximately 2-3 day(s).   Carl Hansen, MD Internal Medicine Resident PGY-2 Carl [name] Internal Medicine Residency Pager: [phone] 06/30/2020 5:15 AM

## 2020-06-30 NOTE — TOC Initial Note (Signed)
Transition of Care [facility]) - Initial/Assessment Note    Patient Details  Name: Carl [name] MRN: [id number] Date of Birth: 1950-09-29  Transition of Care [facility]) CM/SW Contact:    [address]ta [address], [place] Phone Number: 06/30/2020, 12:01 PM  Clinical Narrative:                 CSW received consult for possible SNF placement at time of discharge. CSW spoke with patient's brother [name] via phone regarding PT recommendation of SNF placement at time of discharge. [name] reported that patient has been getting progressively weaker, usually using a cane but has resorted to using a walker due to the weakness. Patient is currently unable to care for himself at his home given patient's current physical needs and fall risk. [name] expressed understanding of PT recommendation and is agreeable to SNF placement for patient at time of discharge. [name] reports preference for [facility] or SNFs in the [place] area. CSW discussed insurance authorization process and provided Medicare SNF ratings list. Patient has received the COVID vaccines. [name] expressed being hopeful for rehab and for patient to feel better soon. No further questions reported at this time. CSW to continue to follow and assist with discharge planning needs.        Patient Goals and CMS Choice        Expected Discharge Plan and Services                                                Prior Living Arrangements/Services                       Activities of Daily Living      Permission Sought/Granted                  Emotional Assessment              Admission diagnosis:  Lightheaded [R42] GI bleed [K92.2] Symptomatic anemia [D64.9] Gastrointestinal hemorrhage, unspecified gastrointestinal hemorrhage type [K92.2] Fatigue, unspecified type [R53.83] Patient Active Problem List   Diagnosis Date Noted   Pressure injury of skin 06/29/2020   Hyponatremia 06/27/2020   GI bleed 06/27/2020    Acquired unequal limb length 05/31/2020   Persistent atrial fibrillation ([place]) 11/30/2019   Secondary hypercoagulable state ([place]) 11/30/2019   Arm DVT (deep venous thromboembolism), acute, left ([place]) 11/26/2019   Hx of adenomatous colonic polyps 05/18/2019   Cirrhosis ([place]) 05/12/2019   Occult blood positive stool 05/12/2019   HCAP (healthcare-associated pneumonia) 03/04/2019   Hallucinations 02/10/2019   Elevated blood sugar 01/27/2019   S/P mitral valve repair 12/31/2018   Bilateral pulmonary contusion 10/17/2018   Cardiac LV ejection fraction of 20-34% 92/06/9416   Chronic systolic CHF (congestive heart failure) ([place]) 10/17/2018   History of ischemic cardiomyopathy 10/17/2018   ICD (implantable cardioverter-defibrillator) in place 10/17/2018   S/p left hip fracture 10/17/2018   Traumatic retroperitoneal hematoma 10/17/2018   Age-related osteoporosis with current pathological fracture with routine healing 07/28/2018   CAD (coronary artery disease) 07/21/2018   GERD (gastroesophageal reflux disease) 07/21/2018   Depression 07/21/2018   CKD (chronic kidney disease), stage III ([place]) 07/21/2018   Iron deficiency anemia 07/21/2018   Hypokalemia 07/21/2018   Closed left hip fracture ([place]) 07/21/2018   Fracture of femoral neck, left ([place]) 07/21/2018   Lower extremity pain, bilateral  11/12/2017   Chronic superficial gastritis without bleeding 09/14/2017   Hyperlipidemia 09/01/2017   Essential hypertension 09/01/2017   PAD (peripheral artery disease) ([place]) 09/01/2017   Symptomatic anemia 08/06/2017   GAD (generalized anxiety disorder) 06/18/2017   Prostate cancer screening 04/28/2017   Hydrocele 04/28/2017   Bradycardia    Premature ventricular contraction    Severe mitral insufficiency    Cardiomyopathy, ischemic 04/26/2015   Difficulty hearing 12/29/2014   Acute on chronic systolic CHF (congestive heart failure) ([place]) 10/10/2014   S/P  CABG x 5 08/03/2014   Protein-calorie malnutrition, severe ([place]) 07/29/2014   AKI (acute kidney injury) ([place]) 07/29/2014   PCP:  [name], MD Pharmacy:   [id number] [place] [place] - [address] [place] [address] Phone: [phone] Fax: [phone]     Social Determinants of Health (SDOH) Interventions    Readmission Risk Interventions Readmission Risk Prevention Plan 03/05/2019 12/31/2018  Transportation Screening Complete Complete  PCP or Specialist Appt within 3-5 Days Complete Complete  HRI or Home Care Consult Complete Complete  Social Work Consult for [place] Planning/Counseling Complete Complete  Palliative Care Screening Complete Not Applicable  Medication Review Press photographer) Complete Complete  Some recent data might be hidden

## 2020-06-30 NOTE — NC FL2 (Signed)
[place] LEVEL OF CARE SCREENING TOOL     IDENTIFICATION  Patient Name: Carl [name] Birthdate: 1950/12/26 Sex: male Admission Date (Current Location): 06/27/2020  [facility] and [place] Number:  Engineering geologist and Address:  [facility] [place], [place] [address] [place], [place] [address]      Provider Number: [id number]  Attending Physician Name and Address:  Aldine Contes, MD  Relative Name and Phone Number:  Juvon Teater [phone]    Current Level of Care: Hospital Recommended Level of Care: [place] Prior Approval Number:    Date Approved/Denied:   PASRR Number: pending  Discharge Plan: SNF    Current Diagnoses: Patient Active Problem List   Diagnosis Date Noted  . Pressure injury of skin 06/29/2020  . Hyponatremia 06/27/2020  . GI bleed 06/27/2020  . 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 Persistent atrial fibrillation ([place]) 11/30/2019  . Secondary hypercoagulable state ([place]) 11/30/2019  . Arm DVT (deep venous thromboembolism), acute, left ([place]) 11/26/2019  . Hx of adenomatous colonic polyps 05/18/2019  . Cirrhosis ([place]) 05/12/2019  . Occult blood positive stool 05/12/2019  . HCAP (healthcare-associated pneumonia) 03/04/2019  . Hallucinations 02/10/2019  . Elevated blood sugar 01/27/2019  . S/P mitral valve repair 12/31/2018  . Bilateral pulmonary contusion 10/17/2018  . Cardiac LV ejection fraction of 20-34% 10/17/2018  . Chronic systolic CHF (congestive heart failure) ([place]) 10/17/2018  . History of ischemic cardiomyopathy 10/17/2018  . ICD (implantable cardioverter-defibrillator) in place 10/17/2018  . S/p left hip fracture 10/17/2018  . Traumatic retroperitoneal hematoma 10/17/2018  . Age-related osteoporosis with current pathological fracture with routine healing 07/28/2018  . CAD (coronary artery disease) 07/21/2018  . GERD (gastroesophageal reflux disease) 07/21/2018  . Depression 07/21/2018   . CKD (chronic kidney disease), stage III ([place]) 07/21/2018  . Iron deficiency anemia 07/21/2018  . Hypokalemia 07/21/2018  . Closed left hip fracture ([place]) 07/21/2018  . Fracture of femoral neck, left ([place]) 07/21/2018  . Lower extremity pain, bilateral 11/12/2017  . 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 Hyperlipidemia 09/01/2017  . Essential hypertension 09/01/2017  . PAD (peripheral artery disease) ([place]) 09/01/2017  . Symptomatic anemia 08/06/2017  . GAD (generalized anxiety disorder) 06/18/2017  . Prostate cancer screening 04/28/2017  . Hydrocele 04/28/2017  . Bradycardia   . Premature ventricular contraction   . Severe mitral insufficiency   . Cardiomyopathy, ischemic 04/26/2015  . Difficulty hearing 12/29/2014  . Acute on chronic systolic CHF (congestive heart failure) ([place]) 10/10/2014  . 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 Protein-calorie malnutrition, severe ([place]) 07/29/2014  . AKI (acute kidney injury) ([place]) 07/29/2014    Orientation RESPIRATION BLADDER Height & Weight     Self, Time, Situation, Place  Normal Incontinent Weight: 168 lb (76.2 kg) Height:  6' (182.9 cm)  BEHAVIORAL SYMPTOMS/MOOD NEUROLOGICAL BOWEL NUTRITION STATUS      Incontinent Diet (see dc summary)  AMBULATORY STATUS COMMUNICATION OF NEEDS Skin   Extensive Assist Verbally Other (Comment) (Pressure Injury 06/28/20 hip left stage 1, sacrum mid stage 1 circular quarter sized)                       Personal Care Assistance Level of Assistance  Bathing, Feeding, Dressing Bathing Assistance: Limited assistance Feeding assistance: Independent Dressing Assistance: Limited assistance     Functional Limitations Info  Sight, Hearing, Speech Sight Info: Adequate Hearing Info: Impaired Speech Info: Adequate    SPECIAL CARE FACTORS FREQUENCY  PT (By licensed PT), OT (By licensed OT)     PT Frequency: 5x week OT Frequency: 5x week            Contractures      Additional  Factors Info  Code Status, Allergies, Psychotropic Code Status Info: DNR Allergies Info: Spironolactone Psychotropic Info: DULoxetine (CYMBALTA) DR capsule 30 mg every evening,         Current Medications (06/30/2020):  This is the current hospital active medication list Current Facility-Administered Medications  Medication Dose Route Frequency Provider Last Rate Last Admin  . acetaminophen (TYLENOL) tablet 650 mg  650 mg Oral Q6H PRN Clarene Essex, MD       Or  . acetaminophen (TYLENOL) suppository 650 mg  650 mg Rectal Q6H PRN Clarene Essex, MD      . amiodarone (PACERONE) tablet 200 mg  200 mg Oral Daily Clarene Essex, MD   200 mg at 06/30/20 0936  . atorvastatin (LIPITOR) tablet 40 mg  40 mg Oral q1800 Christian, Rylee, MD      . carvedilol (COREG) tablet 3.125 mg  3.125 mg Oral BID Gaylan Gerold, DO   3.125 mg at 06/30/20 0936  . DULoxetine (CYMBALTA) DR capsule 30 mg  30 mg Oral QPM Clarene Essex, MD   30 mg at 06/29/20 1655  . ferrous sulfate tablet 325 mg  325 mg Oral Daily Darrick Meigs, Rylee, MD   325 mg at 06/30/20 0945  . gabapentin (NEURONTIN) capsule 300 mg  300 mg Oral QHS Christian, Rylee, MD      . lactulose (CHRONULAC) 10 GM/15ML solution 20 g  20 g Oral Daily Clarene Essex, MD   20 g at 06/30/20 0937  . pantoprazole (PROTONIX) EC tablet 40 mg  40 mg Oral Daily Clarene Essex, MD   40 mg at 06/30/20 0936  . sucralfate (CARAFATE) tablet 1 g  1 g Oral BID Gaylan Gerold, DO   1 g at 06/30/20 0936  . torsemide (DEMADEX) tablet 80 mg  80 mg Oral Daily Gaylan Gerold, DO   80 mg at 06/30/20 1243     Discharge Medications: Please see discharge summary for a list of discharge medications.  Relevant Imaging Results:  Relevant Lab Results:   Additional Information    Khalani Novoa B Rohan Juenger, LCSWA

## 2020-06-30 NOTE — Progress Notes (Signed)
RE: Carl [name] Date of Birth:  [birth year] Date: [birth year]  Please be advised that the above-named patient will require a short-term nursing home stay - anticipated 30 days or less for rehabilitation and strengthening.  The plan is for return home.

## 2020-06-30 NOTE — Progress Notes (Signed)
Patient's sister [name]) called with concerns to patient condition. Reported change in orientation status including inability to remember owning a dog. Reported to MD. Patient's brother [name]) called and spoke with patient. Per [name], orientation status is intact and all questions were answered appropriately. Also reported to MD.

## 2020-07-01 ENCOUNTER — Encounter (HOSPITAL_COMMUNITY)
Admission: EM | Disposition: A | Discharge status: Skilled Nursing Facility | Payer: Self-pay | Source: Home / Self Care | Attending: Internal Medicine

## 2020-07-01 LAB — CBC
HCT: 26.4 % — ABNORMAL LOW (ref 39.0–52.0)
Hemoglobin: 8.2 g/dL — ABNORMAL LOW (ref 13.0–17.0)
MCH: 30.4 pg (ref 26.0–34.0)
MCHC: 31.1 g/dL (ref 30.0–36.0)
MCV: 97.8 fL (ref 80.0–100.0)
Platelets: 259 10*3/uL (ref 150–400)
RBC: 2.7 MIL/uL — ABNORMAL LOW (ref 4.22–5.81)
RDW: 17.5 % — ABNORMAL HIGH (ref 11.5–15.5)
WBC: 6.4 10*3/uL (ref 4.0–10.5)
nRBC: 0.5 % — ABNORMAL HIGH (ref 0.0–0.2)

## 2020-07-01 SURGERY — EGD (ESOPHAGOGASTRODUODENOSCOPY)
Anesthesia: Monitor Anesthesia Care

## 2020-07-01 MED ORDER — POTASSIUM CHLORIDE CRYS ER 20 MEQ PO TBCR
40.0000 meq | EXTENDED_RELEASE_TABLET | Freq: Once | ORAL | Status: AC
  Administered 2020-07-01: 40 meq via ORAL
  Filled 2020-07-01: qty 2

## 2020-07-01 NOTE — Progress Notes (Signed)
   07/01/20 1944  Assess: MEWS Score  BP 102/62  Pulse Rate 75  ECG Heart Rate 76  Resp (!) 22  Level of Consciousness Alert  SpO2 95 %  O2 Device Room Air  Patient Activity (if Appropriate) In bed  Assess: MEWS Score  MEWS Temp 0  MEWS Systolic 0  MEWS Pulse 0  MEWS RR 1  MEWS LOC 0  MEWS Score 1  MEWS Score Color Green  Treat  Pain Scale 0-10  Pain Score 0

## 2020-07-01 NOTE — Progress Notes (Signed)
Subjective:   Hospital day: 4  Overnight event: No acute event overnight  Patient is alert and awake and answer question appropriately this morning.  He has no complaints or any pain.  States that he has not had any bowel movements since yesterday.  Patient understands that he needs to go to SNF for rehab.  He would like an update to his brother.  Objective:  Vital signs in last 24 hours: Vitals:   06/30/20 2227 06/30/20 2330 07/01/20 0300 07/01/20 0426  BP: 93/79 (!) 88/77  93/67  Pulse: 81 73  77  Resp: _0 Temp: 98.6 F (37 C) 98 F (36.7 C) 98.1 F (36.7 C) 97.9 F (36.6 C)  TempSrc: Oral Oral Oral Oral  SpO2: 95% 95%  92%  Weight:      Height:       CBC Latest Ref Rng & Units 07/01/2020 06/30/2020 06/29/2020  WBC 4.0 - 10.5 K/uL 6.4 6.1 6.5  Hemoglobin 13.0 - 17.0 g/dL 8.2(L) 7.9(L) 8.2(L)  Hematocrit 39 - 52 % 26.4(L) 25.8(L) 26.2(L)  Platelets 150 - 400 K/uL 259 242 268   CMP Latest Ref Rng & Units 06/30/2020 06/29/2020 06/28/2020  Glucose 70 - 99 mg/dL 108(H) 108(H) 109(H)  BUN 8 - 23 mg/dL 49(H) 65(H) 78(H)  Creatinine 0.61 - 1.24 mg/dL 2.31(H) 2.67(H) 3.07(H)  Sodium 135 - 145 mmol/L 132(L) 134(L) 128(L)  Potassium 3.5 - 5.1 mmol/L 3.5 3.4(L) 3.3(L)  Chloride 98 - 111 mmol/L 92(L) 94(L) 89(L)  CO2 22 - 32 mmol/L _1 Calcium 8.9 - 10.3 mg/dL 8.8(L) 9.2 9.0  Total Protein 6.5 - 8.1 g/dL - - -  Total Bilirubin 0.3 - 1.2 mg/dL - - -  Alkaline Phos 38 - 126 U/L - - -  AST 15 - 41 U/L - - -  ALT 0 - 44 U/L - - -    Physical Exam  Physical Exam Constitutional:      General: He is not in acute distress.    Appearance: He is not toxic-appearing.  HENT:     Head: Normocephalic.  Eyes:     General:        Right eye: No discharge.        Left eye: No discharge.  Cardiovascular:     Rate and Rhythm: Normal rate and regular rhythm.     Heart sounds: Normal heart sounds.  Pulmonary:     Effort: Pulmonary effort is normal. No respiratory distress.      Breath sounds: Normal breath sounds.  Abdominal:     General: Bowel sounds are normal.     Palpations: Abdomen is soft.  Musculoskeletal:     Cervical back: Normal range of motion.     Right lower leg: Edema (+1) present.     Left lower leg: Edema (+1) present.  Skin:    General: Skin is warm.     Coloration: Skin is not jaundiced.  Neurological:     Mental Status: He is alert.  Psychiatric:        Mood and Affect: Mood normal.      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   Principal Problem:   Symptomatic anemia Active Problems:   AKI (acute kidney injury) ([place])   Acute on chronic systolic CHF (congestive  heart failure) (HCC)   Cardiomyopathy, ischemic   Essential hypertension   Depression   CKD (chronic kidney disease), stage III (HCC)   ICD (implantable cardioverter-defibrillator) in place   Persistent atrial fibrillation (HCC)   Cirrhosis (HCC)   Hyponatremia   GI bleed   Pressure injury of skin  Symptomatic anemia secondary to GI bleed Hemoglobin trend 7.2-8.2-8.2-7.9-8.2.  Upper EGD on this admission only shows portal hypertensive gastropathy with no signs of bleeding.  Hemoglobin currently stable. Continue sucralfate, Protonix and low-dose beta-blocker per GI recommendation.  He will need outpatient colonoscopy with his primary gastroenterologist at [facility]. -Protonix40 mg p.o. -Carafate 1 g twice daily -Carvedilol 3.125 mg twice daily  -CBC in a.m. -Continue hold Eliquis after discharge until he sees his primary GI   Nonalcoholicliver cirrhosis Hepatic encephalopathy Functional decline Have history of liver cirrhosis, no history of alcohol. FAP was within normal limits. AST,ALT,alk phos within normal limits. Total bill  mildly elevated 1.3. Abdomen is not distended, low suspicion for ascites.His mental status is slightly back to baseline.   -Monitormental status -Lactulose 20 g daily   Congestive heart failure with reduced ejection fraction Echocardiogram in March 2021 shows EF 20 to 25%, global hypokinesis of LV, status post mitral valve repair. Currentlyno signs of respiratory distress or orthopnea.  Fluid status is -180 cc.  His blood pressure is stable on torsemide.  Lower extremity edema has significantly improved. -Torsemide 80 mg daily -Strict I&O -Daily weights   Chronic kidney disease stage III His creatinine normalized to baseline.    Continue his diuretic. -BMP in a.m. -Avoid nephrotoxic medications   Hyponatremia Patient has chronic low sodium level dates back in 2020. His baseline sodium is around 130. Etiology is likely poor p.o. intake versus SIADH. -BMP in a.m.   Atrial fibrillation Restart amiodarone Holding Eliquis  Diet:Soft diet IVF:No GBT:DVVO due to GI bleed CODE:DNR  Prior to Admission Living Arrangement:Home Anticipated Discharge Location: SNF Barriers to Discharge: Bed placement Dispo: Anticipated discharge in approximately2-3day(s).   Gaylan Gerold, DO 07/01/2020, 6:24 AM Pager: [phone] After 5pm on weekdays and 1pm on weekends: On Call pager [phone]

## 2020-07-02 ENCOUNTER — Encounter (HOSPITAL_COMMUNITY): Payer: Self-pay | Admitting: Gastroenterology

## 2020-07-02 LAB — BASIC METABOLIC PANEL
Anion gap: 10 (ref 5–15)
BUN: 35 mg/dL — ABNORMAL HIGH (ref 8–23)
CO2: 28 mmol/L (ref 22–32)
Calcium: 9 mg/dL (ref 8.9–10.3)
Chloride: 92 mmol/L — ABNORMAL LOW (ref 98–111)
Creatinine, Ser: 2.14 mg/dL — ABNORMAL HIGH (ref 0.61–1.24)
GFR, Estimated: 33 mL/min — ABNORMAL LOW (ref >60)
Glucose, Bld: 108 mg/dL — ABNORMAL HIGH (ref 70–99)
Potassium: 3.5 mmol/L (ref 3.5–5.1)
Sodium: 130 mmol/L — ABNORMAL LOW (ref 135–145)

## 2020-07-02 LAB — CBC
HCT: 28.7 % — ABNORMAL LOW (ref 39.0–52.0)
Hemoglobin: 8.8 g/dL — ABNORMAL LOW (ref 13.0–17.0)
MCH: 30.3 pg (ref 26.0–34.0)
MCHC: 30.7 g/dL (ref 30.0–36.0)
MCV: 99 fL (ref 80.0–100.0)
Platelets: 269 10*3/uL (ref 150–400)
RBC: 2.9 MIL/uL — ABNORMAL LOW (ref 4.22–5.81)
RDW: 17.9 % — ABNORMAL HIGH (ref 11.5–15.5)
WBC: 6.4 10*3/uL (ref 4.0–10.5)
nRBC: 0.9 % — ABNORMAL HIGH (ref 0.0–0.2)

## 2020-07-02 MED ORDER — POTASSIUM CHLORIDE CRYS ER 20 MEQ PO TBCR
40.0000 meq | EXTENDED_RELEASE_TABLET | Freq: Two times a day (BID) | ORAL | Status: AC
  Administered 2020-07-02 (×2): 40 meq via ORAL
  Filled 2020-07-02 (×2): qty 2

## 2020-07-02 NOTE — Progress Notes (Signed)
   07/02/20 0800  Assess: MEWS Score  Temp 98.4 F (36.9 C)  BP 90/66  Pulse Rate 68  ECG Heart Rate 67  Resp 18  SpO2 100 %  O2 Device Room Air  Assess: MEWS Score  MEWS Temp 0  MEWS Systolic 1  MEWS Pulse 0  MEWS RR 0  MEWS LOC 0  MEWS Score 1  MEWS Score Color Green  Assess: if the MEWS score is Yellow or Red  Were vital signs taken at a resting state? Yes  Focused Assessment No change from prior assessment  Early Detection of Sepsis Score *See Row Information* Low  MEWS guidelines implemented *See Row Information* No, vital signs rechecked  Treat  MEWS Interventions Other (Comment)  Pain Scale 0-10  Pain Score 0  Pain Type Chronic pain  Take Vital Signs  Increase Vital Sign Frequency  Yellow: Q 2hr X 2 then Q 4hr X 2, if remains yellow, continue Q 4hrs  Escalate  MEWS: Escalate Yellow: discuss with charge nurse/RN and consider discussing with provider and RRT  Notify: Charge Nurse/RN  Name of Charge Nurse/RN Notified [place]  Date Charge Nurse/RN Notified 07/02/20  Time Charge Nurse/RN Notified [id number]  Notify: Provider  Provider Name/Title  (not this time vs normalized when repeated.)  Notify: Rapid Response  Name of Rapid Response RN Notified  (not this time vs stable.)  Document  Patient Outcome  (vs repeated they are normal no further interventions.)  Previous yellow mews repeated vs stable no further intervention this time.

## 2020-07-02 NOTE — Progress Notes (Signed)
Subjective:   Hospital day: 5  Overnight event: No acute event overnight  This morning, he states that he is feeling well.  He reports that he looks forward to leaving the hospital and asks about when he can go to the skilled nursing facility. He has a Therapist, occupational and would like to know if he can bring the pet to the facility with him. He reports his last bowel movement was yesterday.  Denies any abdominal pain.  He has no further questions or concerns.  Objective:  Vital signs in last 24 hours: Vitals:   07/02/20 0040 07/02/20 0120 07/02/20 0200 07/02/20 0400  BP: (!) 83/66 (!) 85/58 (!) 89/60 92/71  Pulse: 70   70  Resp: '15 17 17 20  ' Temp:      TempSrc:      SpO2:    98%  Weight:      Height:       CBC Latest Ref Rng & Units 07/02/2020 07/01/2020 06/30/2020  WBC 4.0 - 10.5 K/uL 6.4 6.4 6.1  Hemoglobin 13.0 - 17.0 g/dL 8.8(L) 8.2(L) 7.9(L)  Hematocrit 39 - 52 % 28.7(L) 26.4(L) 25.8(L)  Platelets 150 - 400 K/uL 269 259 242   CMP Latest Ref Rng & Units 07/02/2020 06/30/2020 06/29/2020  Glucose 70 - 99 mg/dL 108(H) 108(H) 108(H)  BUN 8 - 23 mg/dL 35(H) 49(H) 65(H)  Creatinine 0.61 - 1.24 mg/dL 2.14(H) 2.31(H) 2.67(H)  Sodium 135 - 145 mmol/L 130(L) 132(L) 134(L)  Potassium 3.5 - 5.1 mmol/L 3.5 3.5 3.4(L)  Chloride 98 - 111 mmol/L 92(L) 92(L) 94(L)  CO2 22 - 32 mmol/L '28 28 30  ' Calcium 8.9 - 10.3 mg/dL 9.0 8.8(L) 9.2  Total Protein 6.5 - 8.1 g/dL - - -  Total Bilirubin 0.3 - 1.2 mg/dL - - -  Alkaline Phos 38 - 126 U/L - - -  AST 15 - 41 U/L - - -  ALT 0 - 44 U/L - - -    Physical Exam  Physical Exam Constitutional:      General: He is not in acute distress.    Appearance: He is not toxic-appearing.     Comments: Mental status at baseline  HENT:     Head: Normocephalic.  Eyes:     General:        Right eye: No discharge.        Left eye: No discharge.  Cardiovascular:     Rate and Rhythm: Normal rate and regular rhythm.     Heart sounds: Normal heart sounds.   Pulmonary:     Effort: No respiratory distress.     Breath sounds: Normal breath sounds.  Abdominal:     General: Bowel sounds are normal.     Palpations: Abdomen is soft.     Tenderness: There is no abdominal tenderness.  Musculoskeletal:     Cervical back: Normal range of motion.     Right lower leg: Edema (Trace) present.     Left lower leg: Edema (Trace) present.  Skin:    General: Skin is warm.  Neurological:     Mental Status: He is alert.  Psychiatric:        Mood and Affect: Mood normal.     Assessment/Plan: Carl [name] is a 69 year old male with past medical history of DVT on Eliquis, CHF status post AICD, CAD status post CABG, PUD, cirrhosis, bleedingangioectasia on colonoscopy 2018,whowas admitted for symptomatic anemia secondary to GI bleed. Hgb currently stable.   Principal Problem:  Symptomatic anemia Active Problems:   AKI (acute kidney injury) ([place])   Acute on chronic systolic CHF (congestive heart failure) (HCC)   Cardiomyopathy, ischemic   Essential hypertension   Depression   CKD (chronic kidney disease), stage III (HCC)   ICD (implantable cardioverter-defibrillator) in place   Persistent atrial fibrillation (HCC)   Cirrhosis (HCC)   Hyponatremia   GI bleed   Pressure injury of skin  Symptomatic anemia secondary to GI bleed-stable Hemoglobin trend 7.2-8.2-8.2-7.9-8.2-8.8.  Upper EGD on this admission only shows portal hypertensive gastropathy with no signs of bleeding.  Hemoglobin currently stable suggest the bleeding has stopped.Continue sucralfate, Protonix and low-dose beta-blocker per GI recommendation. He will need outpatient colonoscopy with his primary gastroenterologist at [facility]. -Protonix40 mg p.o. -Carafate 1 g twice daily -Carvedilol 3.125 mgtwice daily -CBCin a.m. -Continue hold Eliquisafter discharge until he sees his primary GI   Nonalcoholicliver cirrhosis Hepatic encephalopathy Functional decline Have history  of liver cirrhosis, no history of alcohol. FAP was within normal limits. AST,ALT,alk phos within normal limits. Total bill mildly elevated 1.3. Abdomen is not distended, low suspicion for ascites.His mental status is slightly back to baseline.  -Monitormental status -Lactulose 20 g daily   Congestive heart failure with reduced ejection fraction Echocardiogram in March 2021 shows EF 20 to 25%, global hypokinesis of LV, status post mitral valve repair.Currentlyno signs of respiratory distress or orthopnea. Fluid status is -1296 cc.  His blood pressure is slightly low but stable stable on torsemide.  Lower extremity edema has significantly improved.  Encourage patient to increase p.o. intake and monitor oxygen status closely. -Torsemide 80 mg daily -Strict I&O -Daily weights   Chronic kidney disease stage III His creatinine normalized to baseline.  Continue his diuretic. -BMP in a.m. -Avoid nephrotoxic medications   Hyponatremia Patient has chronic low sodium level dates back in 2020. His baseline sodium is around 130. Etiology is likely poor p.o. intake versus SIADH. -BMP in a.m.   Atrial fibrillation Restart amiodarone Holding Eliquis  Diet:Soft diet IVF:No DXI:PJAS due to GI bleed CODE:DNR  Prior to Admission Living Arrangement:Home Anticipated Discharge Location:SNF Barriers to Discharge:Bed placement Dispo: Anticipated discharge in approximately1-2day(s).   Gaylan Gerold, DO 07/02/2020, 6:29 AM Pager: [phone] After 5pm on weekdays and 1pm on weekends: On Call pager [phone]

## 2020-07-02 NOTE — Progress Notes (Signed)
Internal Medicine Attending:   I saw and examined the patient. I reviewed the resident's note and I agree with the resident's findings and plan as documented in the resident's note.  Patient states that he feels well today and denies any new complaints.  Patient was initially admitted to the hospital with symptomatic anemia secondary to GI bleed.  EGD showed no evidence of active bleed but did have portal hypertensive gastropathy.  Patient's hemoglobin has remained stable and has continued to improve.  No further work-up at this time.  We will continue with sucralfate, Protonix and low-dose beta-blocker.  Patient to follow-up with his outpatient gastroenterologist in [facility] for possible colonoscopy.  No further work-up at this time.  Patient is stable for DC to SNF once bed is available.

## 2020-07-02 NOTE — TOC Progression Note (Signed)
Transition of Care [facility]) - Progression Note    Patient Details  Name: RAYNARD MAPPS MRN: [id number] Date of Birth: [birth year]  Transition of Care [facility]) CM/SW [place], [place] Phone Number: 07/02/2020, 3:51 PM  Clinical Narrative:    CSW spoke to pt's family member, [name]. He was advised that [facility] are not accepting new admits at this time and we are unable to get in contact with his first choice, [facility]. Pt was faxed out further. SW will continue to follow.   Expected Discharge Plan: [place] Barriers to Discharge: Continued Medical Work up, Ship broker  Expected Discharge Plan and Services Expected Discharge Plan: [place] In-house Referral: Clinical Social Work     Living arrangements for the past 2 months: Single Family Home                                       Social Determinants of Health (SDOH) Interventions    Readmission Risk Interventions Readmission Risk Prevention Plan 03/05/2019 12/31/2018  Transportation Screening Complete Complete  PCP or Specialist Appt within 3-5 Days Complete Complete  HRI or [place] Complete Complete  Social Work Consult for [place] Planning/Counseling Complete Complete  Palliative Care Screening Complete Not Applicable  Medication Review Press photographer) Complete Complete  Some recent data might be hidden

## 2020-07-02 NOTE — Progress Notes (Signed)
No ICM remote transmission received for 07/02/2020 due to hospitalization and next ICM transmission scheduled for 07/23/2020.

## 2020-07-02 NOTE — Progress Notes (Signed)
[facility] ([facility] Community Based Palliative Care       This patient is enrolled in our palliative care services in the community.  [facility] will continue to follow for any discharge planning needs and to coordinate continuation of palliative care.   If you have questions or need assistance, please call [phone] or contact the hospital Liaison listed on AMION.     Thank you for the opportunity to participate in this patient's care.     Chrislyn King, BSN, RN [facility] Hospital Liaison   [phone]   

## 2020-07-02 NOTE — TOC Progression Note (Signed)
Transition of Care [facility]) - Progression Note    Patient Details  Name: Carl [name] MRN: [id number] Date of Birth: 03-22-1951  Transition of Care [facility]) CM/SW [place], [place] Phone Number: 07/02/2020, 2:43 PM  Clinical Narrative:    CSW unable to get in contact with [facility]. [place] and [facility] do not have beds available. Will faxout further.   Expected Discharge Plan: [place] Barriers to Discharge: Continued Medical Work up, Ship broker  Expected Discharge Plan and Services Expected Discharge Plan: [place] In-house Referral: Clinical Social Work     Living arrangements for the past 2 months: Single Family Home                                       Social Determinants of Health (SDOH) Interventions    Readmission Risk Interventions Readmission Risk Prevention Plan 03/05/2019 12/31/2018  Transportation Screening Complete Complete  PCP or Specialist Appt within 3-5 Days Complete Complete  HRI or [place] Complete Complete  Social Work Consult for [place] Planning/Counseling Complete Complete  Palliative Care Screening Complete Not Applicable  Medication Review Press photographer) Complete Complete  Some recent data might be hidden

## 2020-07-02 NOTE — Plan of Care (Signed)

## 2020-07-02 NOTE — Care Management Important Message (Signed)
Important Message  Patient Details  Name: Carl [name] MRN: [id number] Date of Birth: August 23, 1951   Medicare Important Message Given:  Yes     Shelda Altes 07/02/2020, 9:45 AM

## 2020-07-02 NOTE — Plan of Care (Signed)
Pt maintaining stable VS ,able to use call bell, and demonstrate safety in expressing his needs.

## 2020-07-03 LAB — CBC
HCT: 28.2 % — ABNORMAL LOW (ref 39.0–52.0)
Hemoglobin: 8.7 g/dL — ABNORMAL LOW (ref 13.0–17.0)
MCH: 30.2 pg (ref 26.0–34.0)
MCHC: 30.9 g/dL (ref 30.0–36.0)
MCV: 97.9 fL (ref 80.0–100.0)
Platelets: 264 10*3/uL (ref 150–400)
RBC: 2.88 MIL/uL — ABNORMAL LOW (ref 4.22–5.81)
RDW: 18 % — ABNORMAL HIGH (ref 11.5–15.5)
WBC: 6.3 10*3/uL (ref 4.0–10.5)
nRBC: 0.3 % — ABNORMAL HIGH (ref 0.0–0.2)

## 2020-07-03 LAB — BASIC METABOLIC PANEL
Anion gap: 9 (ref 5–15)
BUN: 34 mg/dL — ABNORMAL HIGH (ref 8–23)
CO2: 28 mmol/L (ref 22–32)
Calcium: 8.8 mg/dL — ABNORMAL LOW (ref 8.9–10.3)
Chloride: 93 mmol/L — ABNORMAL LOW (ref 98–111)
Creatinine, Ser: 2.2 mg/dL — ABNORMAL HIGH (ref 0.61–1.24)
GFR, Estimated: 32 mL/min — ABNORMAL LOW (ref >60)
Glucose, Bld: 96 mg/dL (ref 70–99)
Potassium: 4.7 mmol/L (ref 3.5–5.1)
Sodium: 130 mmol/L — ABNORMAL LOW (ref 135–145)

## 2020-07-03 NOTE — NC FL2 (Signed)
[place] LEVEL OF CARE SCREENING TOOL     IDENTIFICATION  Patient Name: Carl [name] Birthdate: [birth year] Sex: male Admission Date (Current Location): 06/27/2020  [facility] and [place] Number:  Engineering geologist and Address:  [facility], [place] [address] [place],  [address]      Provider Number: [id number]  Attending Physician Name and Address:  Aldine Contes, MD  Relative Name and Phone Number:  Purcell Jungbluth [phone]    Current Level of Care: Hospital Recommended Level of Care: [place] Prior Approval Number:    Date Approved/Denied:   PASRR Number: pending  Discharge Plan: SNF    Current Diagnoses: Patient Active Problem List   Diagnosis Date Noted  . Pressure injury of skin 06/29/2020  . Hyponatremia 06/27/2020  . GI bleed 06/27/2020  . 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 Persistent atrial fibrillation ([place]) 11/30/2019  . Secondary hypercoagulable state ([place]) 11/30/2019  . Arm DVT (deep venous thromboembolism), acute, left ([place]) 11/26/2019  . Hx of adenomatous colonic polyps 05/18/2019  . Cirrhosis ([place]) 05/12/2019  . Occult blood positive stool 05/12/2019  . HCAP (healthcare-associated pneumonia) 03/04/2019  . Hallucinations 02/10/2019  . Elevated blood sugar 01/27/2019  . S/P mitral valve repair 12/31/2018  . Bilateral pulmonary contusion 10/17/2018  . Cardiac LV ejection fraction of 20-34% 10/17/2018  . Chronic systolic CHF (congestive heart failure) ([place]) 10/17/2018  . History of ischemic cardiomyopathy 10/17/2018  . ICD (implantable cardioverter-defibrillator) in place 10/17/2018  . S/p left hip fracture 10/17/2018  . Traumatic retroperitoneal hematoma 10/17/2018  . Age-related osteoporosis with current pathological fracture with routine healing 07/28/2018  . CAD (coronary artery disease) 07/21/2018  . GERD (gastroesophageal reflux disease) 07/21/2018  . Depression 07/21/2018   . CKD (chronic kidney disease), stage III ([place]) 07/21/2018  . Iron deficiency anemia 07/21/2018  . Hypokalemia 07/21/2018  . Closed left hip fracture ([place]) 07/21/2018  . Fracture of femoral neck, left ([place]) 07/21/2018  . Lower extremity pain, bilateral 11/12/2017  . 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 Hyperlipidemia 09/01/2017  . Essential hypertension 09/01/2017  . PAD (peripheral artery disease) ([place]) 09/01/2017  . Symptomatic anemia 08/06/2017  . GAD (generalized anxiety disorder) 06/18/2017  . Prostate cancer screening 04/28/2017  . Hydrocele 04/28/2017  . Bradycardia   . Premature ventricular contraction   . Severe mitral insufficiency   . Cardiomyopathy, ischemic 04/26/2015  . Difficulty hearing 12/29/2014  . Acute on chronic systolic CHF (congestive heart failure) ([place]) 10/10/2014  . 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 Protein-calorie malnutrition, severe ([place]) 07/29/2014  . AKI (acute kidney injury) ([place]) 07/29/2014    Orientation RESPIRATION BLADDER Height & Weight     Self, Time, Situation, Place  Normal Incontinent Weight: 167 lb (75.8 kg) Height:  6' (182.9 cm)  BEHAVIORAL SYMPTOMS/MOOD NEUROLOGICAL BOWEL NUTRITION STATUS      Incontinent Diet (see dc summary)  AMBULATORY STATUS COMMUNICATION OF NEEDS Skin   Extensive Assist Verbally Other (Comment) (Pressure Injury 06/28/20 hip left stage 1, sacrum mid stage 1 circular quarter sized)                       Personal Care Assistance Level of Assistance  Bathing, Feeding, Dressing Bathing Assistance: Limited assistance Feeding assistance: Independent Dressing Assistance: Limited assistance     Functional Limitations Info  Sight, Hearing, Speech Sight Info: Adequate Hearing Info: Impaired Speech Info: Adequate    SPECIAL CARE FACTORS FREQUENCY  PT (By licensed PT), OT (By licensed OT)     PT Frequency: 5x week OT Frequency: 5x week            Contractures      Additional  Factors Info  Code Status, Allergies, Psychotropic Code Status Info: DNR Allergies Info: Spironolactone Psychotropic Info: DULoxetine (CYMBALTA) DR capsule 30 mg every evening,         Current Medications (07/03/2020):  This is the current hospital active medication list Current Facility-Administered Medications  Medication Dose Route Frequency Provider Last Rate Last Admin  . acetaminophen (TYLENOL) tablet 650 mg  650 mg Oral Q6H PRN Clarene Essex, MD   650 mg at 07/01/20 2158   Or  . acetaminophen (TYLENOL) suppository 650 mg  650 mg Rectal Q6H PRN Clarene Essex, MD      . amiodarone (PACERONE) tablet 200 mg  200 mg Oral Daily Clarene Essex, MD   200 mg at 07/03/20 0855  . atorvastatin (LIPITOR) tablet 40 mg  40 mg Oral q1800 Darrick Meigs, Rylee, MD   40 mg at 07/03/20 1641  . carvedilol (COREG) tablet 3.125 mg  3.125 mg Oral BID Gaylan Gerold, DO   3.125 mg at 07/03/20 0855  . DULoxetine (CYMBALTA) DR capsule 30 mg  30 mg Oral QPM Clarene Essex, MD   30 mg at 07/03/20 1641  . ferrous sulfate tablet 325 mg  325 mg Oral Daily Darrick Meigs, Rylee, MD   325 mg at 07/03/20 0855  . gabapentin (NEURONTIN) capsule 300 mg  300 mg Oral QHS Christian, Rylee, MD   300 mg at 07/02/20 2025  . lactulose (CHRONULAC) 10 GM/15ML solution 20 g  20 g Oral Daily Clarene Essex, MD   20 g at 07/03/20 0855  . pantoprazole (PROTONIX) EC tablet 40 mg  40 mg Oral Daily Clarene Essex, MD   40 mg at 07/03/20 0855  . sucralfate (CARAFATE) tablet 1 g  1 g Oral BID Gaylan Gerold, DO   1 g at 07/03/20 0855  . torsemide (DEMADEX) tablet 80 mg  80 mg Oral Daily Gaylan Gerold, DO   80 mg at 07/03/20 4166     Discharge Medications: Please see discharge summary for a list of discharge medications.  Relevant Imaging Results:  Relevant Lab Results:   Additional Information    Roald Lukacs B Pinchas Reither, LCSWA

## 2020-07-03 NOTE — Progress Notes (Signed)
°   07/03/20 2042  Assess: MEWS Score  Temp (!) 97.5 F (36.4 C)  BP 107/74  Pulse Rate 64  ECG Heart Rate 64  Resp 18  Level of Consciousness Alert  SpO2 96 %  O2 Device Room Air  Patient Activity (if Appropriate) In bed  Assess: MEWS Score  MEWS Temp 0  MEWS Systolic 0  MEWS Pulse 0  MEWS RR 0  MEWS LOC 0  MEWS Score 0  MEWS Score Color Green  Treat  Pain Scale 0-10  Pain Score 0

## 2020-07-03 NOTE — Progress Notes (Signed)
Physical Therapy Treatment Patient Details Name: Carl [name] MRN: [id number] DOB: [birth year] Today's Date: 07/03/2020    History of Present Illness 69 year old male with past medical history of DVT on Eliquis, CHF status post AICD, CAD status post CABG, PUD, cirrhosis, bleeding angioectasia on colonoscopy 2018, who presents to the ED for chief complaint of fatigue. Pt found to have symptomatic anemia due to chronic blood loss.     PT Comments    Pt making slow progress. Continue to recommend SNF.    Follow Up Recommendations  SNF     Equipment Recommendations  None recommended by PT    Recommendations for Other Services       Precautions / Restrictions Precautions Precautions: Fall Precaution Comments: Has a shoe for L foot - old fracture that did not heal correctly    Mobility  Bed Mobility               General bed mobility comments: Pt up in chair  Transfers Overall transfer level: Needs assistance Equipment used: Rolling walker (2 wheeled) Transfers: Sit to/from Stand Sit to Stand: Min assist         General transfer comment: Assist to bring hips up and for balance. Verbal cues for hand placement  Ambulation/Gait Ambulation/Gait assistance: Min assist Gait Distance (Feet): 35 Feet Assistive device: Rolling walker (2 wheeled) Gait Pattern/deviations: Step-through pattern;Decreased step length - right;Decreased step length - left;Shuffle;Trunk flexed Gait velocity: decr Gait velocity interpretation: <1.31 ft/sec, indicative of household ambulator General Gait Details: Assist for balance and support. Verbal cues to stay closer to walker and keep feet inside walker when turning   Stairs             Wheelchair Mobility    Modified Rankin (Stroke Patients Only)       Balance Overall balance assessment: Needs assistance Sitting-balance support: No upper extremity supported;Feet supported Sitting balance-Leahy Scale: Fair     Standing  balance support: Bilateral upper extremity supported Standing balance-Leahy Scale: Poor Standing balance comment: walker and min assist for static standing                            Cognition Arousal/Alertness: Awake/alert Behavior During Therapy: Flat affect Overall Cognitive Status: No family/caregiver present to determine baseline cognitive functioning                               Problem Solving: Slow processing;Requires verbal cues;Requires tactile cues General Comments: Tangential speech. Diffiucult to understand. HOH. Follows one step commands with tactile cues.       Exercises      General Comments General comments (skin integrity, edema, etc.): VSS on RA      Pertinent Vitals/Pain Pain Assessment: Faces Faces Pain Scale: No hurt Pain Location: generalized, s/o tenderness at site of condom cath in standing' Pain Descriptors / Indicators: Grimacing Pain Intervention(s): Monitored during session;Repositioned    Home Living                      Prior Function            PT Goals (current goals can now be found in the care plan section) Progress towards PT goals: Progressing toward goals    Frequency    Min 2X/week      PT Plan Current plan remains appropriate;Frequency needs to be updated    Co-evaluation  AM-PAC PT "6 Clicks" Mobility   Outcome Measure  Help needed turning from your back to your side while in a flat bed without using bedrails?: A Little Help needed moving from lying on your back to sitting on the side of a flat bed without using bedrails?: A Little Help needed moving to and from a bed to a chair (including a wheelchair)?: A Little Help needed standing up from a chair using your arms (e.g., wheelchair or bedside chair)?: A Little Help needed to walk in hospital room?: A Little Help needed climbing 3-5 steps with a railing? : Total 6 Click Score: 16    End of Session Equipment  Utilized During Treatment: Gait belt Activity Tolerance: Patient tolerated treatment well Patient left: in chair;with call bell/phone within reach;with chair alarm set   PT Visit Diagnosis: Other abnormalities of gait and mobility (R26.89);Unsteadiness on feet (R26.81);Muscle weakness (generalized) (M62.81)     Time: 0518-3358 PT Time Calculation (min) (ACUTE ONLY): 25 min  Charges:  $Gait Training: 23-37 mins                     [place] Pager [phone] Office [place] 07/03/2020, 12:25 PM

## 2020-07-03 NOTE — TOC Progression Note (Signed)
Transition of Care [facility]) - Progression Note    Patient Details  Name: Carl [name] MRN: [id number] Date of Birth: 01/24/1951  Transition of Care [facility]) CM/SW [place], [place] Phone Number: 07/03/2020, 4:49 PM  Clinical Narrative:    CSW reached out to [facility], confident that they can accept pt when ready to dc. CSW started [place] and sent clinicals to insurance company. Reference number: [id number]. Family made aware.    Expected Discharge Plan: [place] Barriers to Discharge: Continued Medical Work up, Ship broker  Expected Discharge Plan and Services Expected Discharge Plan: [place] In-house Referral: Clinical Social Work     Living arrangements for the past 2 months: Single Family Home                                       Social Determinants of Health (SDOH) Interventions    Readmission Risk Interventions Readmission Risk Prevention Plan 03/05/2019 12/31/2018  Transportation Screening Complete Complete  PCP or Specialist Appt within 3-5 Days Complete Complete  HRI or [place] Complete Complete  Social Work Consult for [place] Planning/Counseling Complete Complete  Palliative Care Screening Complete Not Applicable  Medication Review Press photographer) Complete Complete  Some recent data might be hidden

## 2020-07-03 NOTE — Progress Notes (Signed)
Occupational Therapy Treatment Patient Details Name: Carl [name] MRN: [id number] DOB: [birth year] Today's Date: 07/03/2020    History of present illness 69 year old male with past medical history of DVT on Eliquis, CHF status post AICD, CAD status post CABG, PUD, cirrhosis, bleeding angioectasia on colonoscopy 2018, who presents to the ED for chief complaint of fatigue. Pt found to have symptomatic anemia due to chronic blood loss.    OT comments  Pt making slow, steady progress towards acute OT goals. Received sitting EOB, up in recliner at end of session. Min A with functional transfers. D/c plan remains appropriate.    Follow Up Recommendations  SNF;Supervision/Assistance - 24 hour    Equipment Recommendations  Other (comment) (defer to next venue)    Recommendations for Other Services      Precautions / Restrictions Precautions Precautions: Fall Precaution Comments: Has a shoe for L foot - old fracture that did not heal correctly Restrictions Weight Bearing Restrictions: No       Mobility Bed Mobility               General bed mobility comments: sitting EOB at start of session  Transfers Overall transfer level: Needs assistance Equipment used: Rolling walker (2 wheeled) Transfers: Sit to/from Stand Sit to Stand: Min assist         General transfer comment: to/from EOB and recliner. Assist to steady, cues for rw technique.     Balance Overall balance assessment: Needs assistance Sitting-balance support: No upper extremity supported;Feet supported Sitting balance-Leahy Scale: Fair     Standing balance support: Bilateral upper extremity supported Standing balance-Leahy Scale: Poor Standing balance comment: walker and min assist for static standing                           ADL either performed or assessed with clinical judgement   ADL Overall ADL's : Needs assistance/impaired                         Toilet Transfer: Minimal  assistance;Stand-pivot;RW;BSC Toilet Transfer Details (indicate cue type and reason): simulated with EOB to recliner. min A to steady during pivotal steps to recliner. cues for technique with rw           General ADL Comments: Completed bed mobility, stood, weight shifted a bit in standing then pivotal steps to access recliner. Seated rest break then stood and wlked in place <30 seconds.      Vision   Additional Comments: Sister states he does not see well out of his R eye.  Unsure of reason.   Perception     Praxis      Cognition Arousal/Alertness: Awake/alert Behavior During Therapy: Flat affect Overall Cognitive Status: No family/caregiver present to determine baseline cognitive functioning                               Problem Solving: Slow processing;Requires verbal cues;Requires tactile cues General Comments: Tangential responses. Follows one step commands fairly consistently, inconsistent multiple step command following. HOH. Difficult to understand his speech.         Exercises     Shoulder Instructions       General Comments VSS on RA    Pertinent Vitals/ Pain       Pain Assessment: Faces Faces Pain Scale: Hurts a little bit Pain Location: generalized, s/o tenderness at site of condom  cath in standing' Pain Descriptors / Indicators: Grimacing Pain Intervention(s): Monitored during session;Repositioned  Home Living                                          Prior Functioning/Environment              Frequency  Min 2X/week        Progress Toward Goals  OT Goals(current goals can now be found in the care plan section)  Progress towards OT goals: Progressing toward goals  Acute Rehab OT Goals Patient Stated Goal: Get out of bed OT Goal Formulation: With patient Time For Goal Achievement: 07/12/20 Potential to Achieve Goals: Good ADL Goals Pt Will Perform Grooming: with set-up;standing Pt Will Perform Lower Body  Bathing: with set-up;sit to/from stand Pt Will Perform Lower Body Dressing: with set-up;sit to/from stand Pt Will Transfer to Toilet: with modified independence;regular height toilet;ambulating  Plan Discharge plan remains appropriate    Co-evaluation                 AM-PAC OT "6 Clicks" Daily Activity     Outcome Measure   Help from another person eating meals?: None Help from another person taking care of personal grooming?: A Little Help from another person toileting, which includes using toliet, bedpan, or urinal?: A Lot Help from another person bathing (including washing, rinsing, drying)?: A Lot Help from another person to put on and taking off regular upper body clothing?: A Little Help from another person to put on and taking off regular lower body clothing?: A Lot 6 Click Score: 16    End of Session Equipment Utilized During Treatment: Rolling walker  OT Visit Diagnosis: Unsteadiness on feet (R26.81)   Activity Tolerance Patient tolerated treatment well;Patient limited by fatigue   Patient Left in chair;with call bell/phone within reach;with chair alarm set   Nurse Communication          Time: 6203-5597 OT Time Calculation (min): 22 min  Charges: OT General Charges $OT Visit: 1 Visit OT Treatments $Self Care/Home Management : 8-22 mins  Tyrone Schimke, OT Acute Rehabilitation Services Pager: [phone] Office: [phone]    Hortencia Pilar 07/03/2020, 1:24 PM

## 2020-07-03 NOTE — Progress Notes (Signed)
Subjective:   Hospital day: 5  Overnight event: No event overnight  Patient appears alert awake and oriented.  He denies abdominal pain or shortness of breath.  Reports good appetite.  Last bowel movement was this morning, described as light brown.  Awaiting SNF placement.  Objective:  Vital signs in last 24 hours: Vitals:   07/02/20 2025 07/02/20 2100 07/02/20 2339 07/03/20 0400  BP: 104/73 1_0  Pulse: 74 75 79 67  Resp:  (!) _1 Temp:   97.9 F (36.6 C) 97.9 F (36.6 C)  TempSrc:   Oral Oral  SpO2:  95% 99% 99%  Weight:    75.8 kg  Height:       CBC Latest Ref Rng & Units 07/03/2020 07/02/2020 07/01/2020  WBC 4.0 - 10.5 K/uL 6.3 6.4 6.4  Hemoglobin 13.0 - 17.0 g/dL 8.7(L) 8.8(L) 8.2(L)  Hematocrit 39 - 52 % 28.2(L) 28.7(L) 26.4(L)  Platelets 150 - 400 K/uL 264 269 259   CMP Latest Ref Rng & Units 07/03/2020 07/02/2020 06/30/2020  Glucose 70 - 99 mg/dL 96 108(H) 108(H)  BUN 8 - 23 mg/dL 34(H) 35(H) 49(H)  Creatinine 0.61 - 1.24 mg/dL 2.20(H) 2.14(H) 2.31(H)  Sodium 135 - 145 mmol/L 130(L) 130(L) 132(L)  Potassium 3.5 - 5.1 mmol/L 4.7 3.5 3.5  Chloride 98 - 111 mmol/L 93(L) 92(L) 92(L)  CO2 22 - 32 mmol/L _2 Calcium 8.9 - 10.3 mg/dL 8.8(L) 9.0 8.8(L)  Total Protein 6.5 - 8.1 g/dL - - -  Total Bilirubin 0.3 - 1.2 mg/dL - - -  Alkaline Phos 38 - 126 U/L - - -  AST 15 - 41 U/L - - -  ALT 0 - 44 U/L - - -    Physical Exam  Physical Exam Constitutional:      General: He is not in acute distress.    Appearance: He is not toxic-appearing.  HENT:     Head: Normocephalic.  Eyes:     General:        Right eye: No discharge.        Left eye: No discharge.  Cardiovascular:     Rate and Rhythm: Normal rate and regular rhythm.  Pulmonary:     Effort: No respiratory distress.     Breath sounds: Normal breath sounds.  Abdominal:     General: Bowel sounds are normal.     Palpations: Abdomen is soft.     Tenderness: There is no abdominal  tenderness.  Musculoskeletal:     Right lower leg: Edema (Trace) present.     Left lower leg: Edema (Trace) present.  Skin:    General: Skin is warm.  Neurological:     Mental Status: He is alert.  Psychiatric:        Mood and Affect: Mood normal.     Assessment/Plan: Derell Bruun is a 69 year old male with past medical history of DVT on Eliquis, CHF status post AICD, CAD status post CABG, PUD, cirrhosis, bleedingangioectasia on colonoscopy 2018,whowas admitted for symptomatic anemia secondary to GI bleed. Hgb currently stable.   Await SNF placement.  Principal Problem:   Symptomatic anemia Active Problems:   AKI (acute kidney injury) ([place])   Acute on chronic systolic CHF (congestive heart failure) ([facility])   Cardiomyopathy, ischemic   Essential hypertension   Depression   CKD (chronic kidney disease), stage III ([place])   ICD (implantable cardioverter-defibrillator) in place   Persistent atrial fibrillation ([place])   Cirrhosis ([place])  Hyponatremia   GI bleed   Pressure injury of skin  Symptomatic anemiasecondary to GI bleed-stable Hemoglobin trend 7.2-8.2-8.2-7.9-8.2-8.8-8.7. Upper EGD on this admission only shows portal hypertensive gastropathy with no signs of bleeding.Continue sucralfate, Protonix and low-dose beta-blocker per GI recommendation. He will need outpatient colonoscopy with his primary gastroenterologist at [facility]. -Protonix40 mg p.o.  -Carafate 1 g twice daily -Carvedilol 3.125 mgtwice daily -Continue hold Eliquisafter discharge until he sees his primary GI   Nonalcoholicliver cirrhosis Hepatic encephalopathy Functional decline Have history of liver cirrhosis, no history of alcohol. FAP was within normal limits. AST,ALT,alk phos within normal limits. Total bill mildly elevated 1.3. Abdomen is not distended, low suspicion for ascites.His mental status is slightly back to baseline. No further eval. -Monitormental status -Lactulose 20 g  daily   Congestive heart failure with reduced ejection fraction Echocardiogram in March 2021 shows EF 20 to 25%, global hypokinesis of LV, status post mitral valve repair.Currentlyno signs of respiratory distress or orthopnea.Fluid status is-1800cc. His blood pressure at baseline. Lower extremity edema stable.  Encourage patient to increase p.o. intake and monitor oxygen status closely. -Torsemide 80 mg daily -Strict I&O -Daily weights   Chronic kidney disease stage III His creatinine normalized to baseline.Continuehis diuretic. -BMP in a.m. -Avoid nephrotoxic medications   Hyponatremia Patient has chronic low sodium level dates back in 2020. His baseline sodium is around 130. Etiology is likely poor p.o. intake versus SIADH. -BMP in a.m.   Atrial fibrillation Restart amiodarone Holding Eliquis  Diet:Soft diet IVF:No LNL:GXQJ due to GI bleed CODE:DNR  Prior to Admission Living Arrangement:Home Anticipated Discharge Location:SNF Barriers to Discharge:Bed placement Dispo: Anticipated discharge in approximately1-2day(s).  Gaylan Gerold, DO 07/03/2020, 6:03 AM Pager: [phone] After 5pm on weekdays and 1pm on weekends: On Call pager [phone]

## 2020-07-03 NOTE — NC FL2 (Signed)
[place] LEVEL OF CARE SCREENING TOOL     IDENTIFICATION  Patient Name: Carl [name] Birthdate: [birth year] Sex: male Admission Date (Current Location): 06/27/2020  [facility] and [place] Number:  Engineering geologist and Address:  [facility], [place] [address], [place], [place] [address]      Provider Number: [id number]  Attending Physician Name and Address:  Aldine Contes, MD  Relative Name and Phone Number:  Maxmilian Trostel [phone]    Current Level of Care: Hospital Recommended Level of Care: [place] Prior Approval Number:    Date Approved/Denied:   PASRR Number: pending  Discharge Plan: SNF    Current Diagnoses: Patient Active Problem List   Diagnosis Date Noted  . Pressure injury of skin 06/29/2020  . Hyponatremia 06/27/2020  . GI bleed 06/27/2020  . 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 Persistent atrial fibrillation ([place]) 11/30/2019  . Secondary hypercoagulable state ([place]) 11/30/2019  . Arm DVT (deep venous thromboembolism), acute, left ([place]) 11/26/2019  . Hx of adenomatous colonic polyps 05/18/2019  . Cirrhosis ([place]) 05/12/2019  . Occult blood positive stool 05/12/2019  . HCAP (healthcare-associated pneumonia) 03/04/2019  . Hallucinations 02/10/2019  . Elevated blood sugar 01/27/2019  . S/P mitral valve repair 12/31/2018  . Bilateral pulmonary contusion 10/17/2018  . Cardiac LV ejection fraction of 20-34% 10/17/2018  . Chronic systolic CHF (congestive heart failure) ([place]) 10/17/2018  . History of ischemic cardiomyopathy 10/17/2018  . ICD (implantable cardioverter-defibrillator) in place 10/17/2018  . S/p left hip fracture 10/17/2018  . Traumatic retroperitoneal hematoma 10/17/2018  . Age-related osteoporosis with current pathological fracture with routine healing 07/28/2018  . CAD (coronary artery disease) 07/21/2018  . GERD (gastroesophageal reflux disease) 07/21/2018  . Depression 07/21/2018   . CKD (chronic kidney disease), stage III ([place]) 07/21/2018  . Iron deficiency anemia 07/21/2018  . Hypokalemia 07/21/2018  . Closed left hip fracture ([place]) 07/21/2018  . Fracture of femoral neck, left ([place]) 07/21/2018  . Lower extremity pain, bilateral 11/12/2017  . 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 Hyperlipidemia 09/01/2017  . Essential hypertension 09/01/2017  . PAD (peripheral artery disease) ([place]) 09/01/2017  . Symptomatic anemia 08/06/2017  . GAD (generalized anxiety disorder) 06/18/2017  . Prostate cancer screening 04/28/2017  . Hydrocele 04/28/2017  . Bradycardia   . Premature ventricular contraction   . Severe mitral insufficiency   . Cardiomyopathy, ischemic 04/26/2015  . Difficulty hearing 12/29/2014  . Acute on chronic systolic CHF (congestive heart failure) ([place]) 10/10/2014  . 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 Protein-calorie malnutrition, severe ([place]) 07/29/2014  . AKI (acute kidney injury) ([place]) 07/29/2014    Orientation RESPIRATION BLADDER Height & Weight     Self, Time, Situation, Place  Normal Incontinent Weight: 167 lb (75.8 kg) Height:  6' (182.9 cm)  BEHAVIORAL SYMPTOMS/MOOD NEUROLOGICAL BOWEL NUTRITION STATUS      Incontinent Diet (see dc summary)  AMBULATORY STATUS COMMUNICATION OF NEEDS Skin   Extensive Assist Verbally Other (Comment) (Pressure Injury 06/28/20 hip left stage 1, sacrum mid stage 1 circular quarter sized)                       Personal Care Assistance Level of Assistance  Bathing, Feeding, Dressing Bathing Assistance: Limited assistance Feeding assistance: Independent Dressing Assistance: Limited assistance     Functional Limitations Info  Sight, Hearing, Speech Sight Info: Adequate Hearing Info: Impaired Speech Info: Adequate    SPECIAL CARE FACTORS FREQUENCY  PT (By licensed PT), OT (By licensed OT)     PT Frequency: 5x week OT Frequency: 5x week            Contractures      Additional  Factors Info  Code Status, Allergies, Psychotropic Code Status Info: DNR Allergies Info: Spironolactone Psychotropic Info: DULoxetine (CYMBALTA) DR capsule 30 mg every evening,         Current Medications (07/03/2020):  This is the current hospital active medication list Current Facility-Administered Medications  Medication Dose Route Frequency Provider Last Rate Last Admin  . acetaminophen (TYLENOL) tablet 650 mg  650 mg Oral Q6H PRN Clarene Essex, MD   650 mg at 07/01/20 2158   Or  . acetaminophen (TYLENOL) suppository 650 mg  650 mg Rectal Q6H PRN Clarene Essex, MD      . amiodarone (PACERONE) tablet 200 mg  200 mg Oral Daily Clarene Essex, MD   200 mg at 07/03/20 0855  . atorvastatin (LIPITOR) tablet 40 mg  40 mg Oral q1800 Darrick Meigs, Rylee, MD   40 mg at 07/03/20 1641  . carvedilol (COREG) tablet 3.125 mg  3.125 mg Oral BID Gaylan Gerold, DO   3.125 mg at 07/03/20 0855  . DULoxetine (CYMBALTA) DR capsule 30 mg  30 mg Oral QPM Clarene Essex, MD   30 mg at 07/03/20 1641  . ferrous sulfate tablet 325 mg  325 mg Oral Daily Darrick Meigs, Rylee, MD   325 mg at 07/03/20 0855  . gabapentin (NEURONTIN) capsule 300 mg  300 mg Oral QHS Christian, Rylee, MD   300 mg at 07/02/20 2025  . lactulose (CHRONULAC) 10 GM/15ML solution 20 g  20 g Oral Daily Clarene Essex, MD   20 g at 07/03/20 0855  . pantoprazole (PROTONIX) EC tablet 40 mg  40 mg Oral Daily Clarene Essex, MD   40 mg at 07/03/20 0855  . sucralfate (CARAFATE) tablet 1 g  1 g Oral BID Gaylan Gerold, DO   1 g at 07/03/20 0855  . torsemide (DEMADEX) tablet 80 mg  80 mg Oral Daily Gaylan Gerold, DO   80 mg at 07/03/20 8592     Discharge Medications: Please see discharge summary for a list of discharge medications.  Relevant Imaging Results:  Relevant Lab Results:   Additional Information    Robbyn Hodkinson B Nahima Ales, LCSWA

## 2020-07-04 ENCOUNTER — Inpatient Hospital Stay: Payer: Medicare Other | Attending: Oncology

## 2020-07-04 LAB — SARS CORONAVIRUS 2 BY RT PCR (HOSPITAL ORDER, PERFORMED IN ~~LOC~~ HOSPITAL LAB): SARS Coronavirus 2: NEGATIVE

## 2020-07-04 MED ORDER — PHENOL 1.4 % MT LIQD: 1.0000 | OROMUCOSAL | Status: DC | PRN

## 2020-07-04 NOTE — Discharge Summary (Addendum)
Name: Carl [name] MRN: [id number] DOB: [birth year] 69 y.o. PCP: [name], MD  Date of Admission: 06/27/2020 11:06 AM Date of Discharge: 07/05/2020 Attending Physician: Aldine Contes, MD  Discharge Diagnosis: 1.  Symptomatic anemia secondary to GI bleed 2.  Liver cirrhosis  3.  Hepatic encephalopathy 4.  AKI on CKD3 5.  Persistent A. Fib 6.  Hyponatremia  Discharge Medications: Allergies as of 07/05/2020       Reactions   Spironolactone Other (See Comments)   gynecomastia         Medication List     STOP taking these medications    apixaban 5 MG Tabs tablet Commonly known as: ELIQUIS       TAKE these medications    acetaminophen 500 MG tablet Commonly known as: TYLENOL Take 500-1,000 mg by mouth every 6 (six) hours as needed (for pain.).   allopurinol 100 MG tablet Commonly known as: ZYLOPRIM Take 100 mg by mouth daily.   amiodarone 200 MG tablet Commonly known as: PACERONE Take 1 tablet (200 mg total) by mouth daily.   atorvastatin 40 MG tablet Commonly known as: LIPITOR TAKE 1 TABLET (40 MG TOTAL) BY MOUTH DAILY AT 6 PM. What changed: when to take this   carvedilol 3.125 MG tablet Commonly known as: COREG Take 1 tablet (3.125 mg total) by mouth 2 (two) times daily.   docusate sodium 100 MG capsule Commonly known as: COLACE Take 100 mg by mouth daily as needed for mild constipation.   DULoxetine 30 MG capsule Commonly known as: CYMBALTA Take 30 mg by mouth every evening.   ferrous sulfate 325 (65 FE) MG tablet Take 1 tablet (325 mg total) by mouth daily with breakfast. What changed: when to take this   gabapentin 300 MG capsule Commonly known as: NEURONTIN Take 300 mg by mouth at bedtime.   ketoconazole 2 % shampoo Commonly known as: NIZORAL Apply 1 application topically 2 (two) times a week.   lactulose 10 GM/15ML solution Commonly known as: CHRONULAC Take 30 mLs (20 g total) by mouth daily.   metolazone 2.5 MG  tablet Commonly known as: ZAROXOLYN Take 1 tablet (2.5 mg total) by mouth once a week. Take 20 mEq Potassium with Metolazone. What changed: additional instructions   multivitamin tablet Take 1 tablet by mouth at bedtime.   pantoprazole 40 MG tablet Commonly known as: PROTONIX TAKE 1 TABLET BY MOUTH EVERY DAY   potassium chloride SA 20 MEQ tablet Commonly known as: Klor-Con M20 Take 1 tablet (20 mEq total) by mouth daily.   sucralfate 1 g tablet Commonly known as: CARAFATE Take 1 tablet (1 g total) by mouth 2 (two) times daily.   torsemide 20 MG tablet Commonly known as: DEMADEX TAKE 4 TABLETS BY MOUTH DAILY. What changed: See the new instructions.   vitamin B-12 500 MCG tablet Commonly known as: CYANOCOBALAMIN Take 500 mcg by mouth at bedtime.   vitamin C 100 MG tablet Take 100 mg by mouth daily.   Vitamin D3 Super Strength 50 MCG (2000 UT) Tabs Generic drug: Cholecalciferol Take 2,000 Units by mouth at bedtime.               Discharge Care Instructions  (From admission, onward)           Start     Ordered   07/05/20 0000  Discharge wound care:       Comments: Per wound care nurse   07/05/20 1006  Disposition and follow-up:   Carl [name] was discharged from [facility] in Stable condition.  At the hospital follow up visit please address:  1.  Primary care physician -Repeat CBC to monitor for hemoglobin -Repeat BMP for hyponatremia  Gastroenterology -Outpatient colonoscopy -Discuss whether patient needs to continue Eliquis -Patient is taking Carafate 1 g twice daily and lactulose 20 g daily  Cardiology -Follow-up with CHF and A. Fib -Discussed whether patient needs to continue Eliquis  2.  Labs / imaging needed at time of follow-up: Repeat CBC and BMP  3.  Pending labs/ test needing follow-up: N/A  Follow-up Appointments:    Primary care Gastroenterology Cardiology  Hospital Course by problem  list: 1.  Symptomatic anemia secondary to GI bleed Patient presents to the ED with fatigue, hemoglobin of 6.5 with positive FOBT.  Suspecting GI bleed in the setting of Eliquis use.  Patient does have a history of iron deficiency is anemia due to chronic blood loss (iron study on 06/26/2020 shows ferritin 17, iron of 10, and iron saturation of 2%).  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  Upper EGD 2018 only shows reflux gastritis.  Upper EGD on this admission only shows portal hypertensive gastropathy with no signs of bleeding.  Hemoglobin improved after given Feraheme. His systolic pressure is at baseline around 90s.  Continue sucralfate, Protonix and low-dose beta-blocker per GI recommendation.  He will need outpatient colonoscopy with his primary gastroenterologist at [facility].  His hemoglobin has been stable throughout this admission.  Hemoglobin on discharge is 9.2.  No sign of overt bleeding.  Eliquis was held in the setting of bleeding.  Patient will discuss with his GI and cardiologist regarding the need to restart Eliquis.  Nonalcoholic liver cirrhosis Hepatic encephalopathy Patient has history of liver cirrhosis, no history of alcohol.  FAP was within normal limits.  AST, ALT, alk phos within normal limits.  Total bill mildly elevated 1.3.  Abdomen is not distended, low suspicion for ascites.  GI recommended starting lactulose 20 g daily given elevated ammonia of 60 with asterixis on exam.  His mental status improved after given lactulose and improvement of hemoglobin.  Congestive heart failure with reduced ejection fraction Echocardiogram in March 2021 shows EF 20 to 25%, global hypokinesis of LV, status post mitral valve repair.  Patient's lower extremity edema has worsened in the last 2-3 weeks.  Chest x-ray shows bilateral pleural effusion.  BNP is elevated at 1500.  Patient with likely mild acute on chronic systolic heart failure exacerbation.   Diuretics was held originally due to hypotension and AKI.  Torsemide was started after resolution of AKI.  Patient is volume status has improved.  Patient's respiratory status was stable throughout the admission.  Hyponatremia Patient has chronic low sodium level dates back in 2020.  His baseline sodium is around 130.  Etiology is likely poor p.o. intake versus SIADH.  Discharge Vitals:   BP 97/75 (BP Location: Left Arm)   Pulse 73   Temp 98.5 F (36.9 C) (Oral)   Resp 17   Ht 6' (1.829 m)   Wt 71.2 kg   SpO2 98%   BMI 21.29 kg/m   Pertinent Labs, Studies, and Procedures:  CBC Latest Ref Rng & Units 07/05/2020 07/03/2020 07/02/2020  WBC 4.0 - 10.5 K/uL 6.4 6.3 6.4  Hemoglobin 13.0 - 17.0 g/dL 9.2(L) 8.7(L) 8.8(L)  Hematocrit 39 - 52 % 30.4(L) 28.2(L) 28.7(L)  Platelets 150 - 400  K/uL 263 264 269   CMP Latest Ref Rng & Units 07/03/2020 07/02/2020 06/30/2020  Glucose 70 - 99 mg/dL 96 108(H) 108(H)  BUN 8 - 23 mg/dL 34(H) 35(H) 49(H)  Creatinine 0.61 - 1.24 mg/dL 2.20(H) 2.14(H) 2.31(H)  Sodium 135 - 145 mmol/L 130(L) 130(L) 132(L)  Potassium 3.5 - 5.1 mmol/L 4.7 3.5 3.5  Chloride 98 - 111 mmol/L 93(L) 92(L) 92(L)  CO2 22 - 32 mmol/L '28 28 28  ' Calcium 8.9 - 10.3 mg/dL 8.8(L) 9.0 8.8(L)  Total Protein 6.5 - 8.1 g/dL - - -  Total Bilirubin 0.3 - 1.2 mg/dL - - -  Alkaline Phos 38 - 126 U/L - - -  AST 15 - 41 U/L - - -  ALT 0 - 44 U/L - - -   DG Chest Portable 1 View  Result Date: 06/27/2020 CLINICAL DATA:  69 year old male with fatigue. Lower extremity swelling. EXAM: PORTABLE CHEST 1 VIEW COMPARISON:  Chest radiographs 03/03/2019 and earlier. FINDINGS: Portable AP semi upright view at 1210 hours. Chronic left chest cardiac AICD. Prior CABG. Stable cardiomegaly and mediastinal contours. Mildly lower lung volumes. Mild new veiling opacity at the lung bases suspicious for pleural effusion. Pulmonary vascular congestion similar to but increased from last year. No pneumothorax. No air  bronchograms. Paucity of bowel gas in the upper abdomen. Chronic right rib fractures. No acute osseous abnormality identified. IMPRESSION: Suspect acute pulmonary interstitial edema with small bilateral pleural effusions. Chronic cardiomegaly, AICD. Electronically Signed   By: Genevie Ann M.D.   On: 06/27/2020 12:23   Discharge Instructions: Discharge Instructions     Call MD for:  persistant nausea and vomiting   Complete by: As directed    Call MD for:  severe uncontrolled pain   Complete by: As directed    Diet - low sodium heart healthy   Complete by: As directed    Discharge instructions   Complete by: As directed    Mr. Main, it is a pleasure taking care of you during this admission.  You were admitted for low blood count likely due to bleeding from your intestinal tract.  Your blood count improved.  I am holding your Eliquis to prevent further bleeding.  -Please follow-up with your gastroenterologist to have a outpatient colonoscopy.  Please discuss with them about either continue or stop Eliquis. -Please follow-up with your cardiologist for your atrial fibrillation and heart failure.  Please discuss with them about either continue or stop Eliquis -Please follow-up with your primary care physician  Take care   Discharge wound care:   Complete by: As directed    Per wound care nurse   Increase activity slowly   Complete by: As directed        Signed: Gaylan Gerold, DO 07/05/2020, 10:09 AM   Pager: [phone]

## 2020-07-04 NOTE — Plan of Care (Signed)
Pt health status continuing to be stable. Pt awaiting bed at SNF.

## 2020-07-04 NOTE — Progress Notes (Addendum)
Subjective:   Hospital day: 7  Overnight event: No acute event overnight  This morning, patient reports that he feels well and denies any new complaints. He understands the plan to go to [facility] upon [employer]. He has no further questions or concerns.  Objective:  Vital signs in last 24 hours: Vitals:   07/03/20 2042 07/03/20 2348 07/03/20 2351 07/04/20 0358  BP: 107/74 90/60  (!) 89/50  Pulse: 64   68  Resp: '18 13 18 17  ' Temp: (!) 97.5 F (36.4 C)  97.8 F (36.6 C) (!) 97.5 F (36.4 C)  TempSrc: Oral  Oral Axillary  SpO2: 96%   98%  Weight:    74.8 kg  Height:       CBC Latest Ref Rng & Units 07/03/2020 07/02/2020 07/01/2020  WBC 4.0 - 10.5 K/uL 6.3 6.4 6.4  Hemoglobin 13.0 - 17.0 g/dL 8.7(L) 8.8(L) 8.2(L)  Hematocrit 39 - 52 % 28.2(L) 28.7(L) 26.4(L)  Platelets 150 - 400 K/uL 264 269 259   CMP Latest Ref Rng & Units 07/03/2020 07/02/2020 06/30/2020  Glucose 70 - 99 mg/dL 96 108(H) 108(H)  BUN 8 - 23 mg/dL 34(H) 35(H) 49(H)  Creatinine 0.61 - 1.24 mg/dL 2.20(H) 2.14(H) 2.31(H)  Sodium 135 - 145 mmol/L 130(L) 130(L) 132(L)  Potassium 3.5 - 5.1 mmol/L 4.7 3.5 3.5  Chloride 98 - 111 mmol/L 93(L) 92(L) 92(L)  CO2 22 - 32 mmol/L '28 28 28  ' Calcium 8.9 - 10.3 mg/dL 8.8(L) 9.0 8.8(L)  Total Protein 6.5 - 8.1 g/dL - - -  Total Bilirubin 0.3 - 1.2 mg/dL - - -  Alkaline Phos 38 - 126 U/L - - -  AST 15 - 41 U/L - - -  ALT 0 - 44 U/L - - -     Physical Exam  Physical Exam Constitutional:      General: He is not in acute distress.    Appearance: He is not toxic-appearing.  HENT:     Head: Normocephalic.  Eyes:     General:        Right eye: No discharge.        Left eye: No discharge.  Cardiovascular:     Rate and Rhythm: Normal rate and regular rhythm.     Heart sounds: Normal heart sounds.  Pulmonary:     Effort: No respiratory distress.     Breath sounds: Normal breath sounds.  Abdominal:     General: Bowel sounds are normal.     Palpations:  Abdomen is soft.  Musculoskeletal:     Right lower leg: Edema (Trace) present.     Left lower leg: Edema (Trace) present.  Neurological:     Mental Status: He is alert.  Psychiatric:        Mood and Affect: Mood normal.     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  Await SNF placement.  Principal Problem:   Symptomatic anemia Active Problems:   AKI (acute kidney injury) ([place])   Acute on chronic systolic CHF (congestive heart failure) (HCC)   Cardiomyopathy, ischemic   Essential hypertension   Depression   CKD (chronic kidney disease), stage III ([place])   ICD (implantable cardioverter-defibrillator) in place   Persistent atrial fibrillation (HCC)   Cirrhosis (HCC)   Hyponatremia   GI bleed  Pressure injury of skin  Symptomatic anemiasecondary to GI bleed-stable Hemoglobin trend 7.2-8.2-8.2-7.9-8.2-8.8-8.7. No overt sign of bleeding. Vital signs stable. Upper EGD on this admission only shows portal hypertensive gastropathy with no signs of bleeding.Continue sucralfate, Protonix and low-dose beta-blocker per GI recommendation. He will need outpatient colonoscopy with his primary gastroenterologist at [facility]. -Protonix40 mg p.o.  -Carafate 1 g twice daily -Carvedilol 3.125 mgtwice daily -Continue hold Eliquisafter discharge until he sees his primary GI   Nonalcoholicliver cirrhosis Hepatic encephalopathy-resolved Functional decline Have history of liver cirrhosis, no history of alcohol. FAP was within normal limits. AST,ALT,alk phos within normal limits. Total bill mildly elevated 1.3. Abdomen is not distended, low suspicion for ascites.His mental status is slightly back to baseline. No further eval. -Monitormental  status -Lactulose 20 g daily -Pending SNF placement   Congestive heart failure with reduced ejection fraction-stable Echocardiogram in March 2021 shows EF 20 to 25%, global hypokinesis of LV, status post mitral valve repair.Currentlyno signs of respiratory distress or orthopnea. His blood pressure at baseline. Lower extremity edema stable.Encourage patient to increase p.o. intake and monitor oxygen status closely. Urine output of 1600 cc. -Torsemide 80 mg daily -Strict I&O -Daily weights -EKG shows NSR with PVCs and RBBB which is similar to previous EKG.  QTc 549.  Her chart review, patient has had QT prolongation since August.  Patient will follow up with his cardiologist outpatient.  It appears like he had an appointment for 07/23/2020.   Chronic kidney disease stage III His creatinine normalized to baseline.Continuehis diuretic. -BMP in a.m. -Avoid nephrotoxic medications   Hyponatremia-stable Patient has chronic low sodium level dates back in 2020. His baseline sodium is around 130. Etiology is likely poor p.o. intake versus SIADH. -BMP in a.m.   Atrial fibrillation Restart amiodarone Holding Eliquis  Diet:Soft diet IVF:No KVQ:QVZD due to GI bleed CODE:DNR  Prior to Admission Living Arrangement:Home Anticipated Discharge Location:SNF Barriers to Discharge:Bed placement Dispo: Anticipated discharge in approximately1-2day(s).   Gaylan Gerold, DO 07/04/2020, 6:45 AM Pager: [phone] After 5pm on weekdays and 1pm on weekends: On Call pager [phone]

## 2020-07-04 NOTE — TOC Progression Note (Addendum)
Transition of Care [facility]) - Progression Note    Patient Details  Name: Carl [name] MRN: [id number] Date of Birth: 1950/09/10  Transition of Care [facility]) CM/SW [place], [place] Phone Number: 07/04/2020, 10:09 AM  Clinical Narrative:    Pt will be able to go to [facility] tomorrow. Number to call report [phone]. Authorization approved, review date 11/15. CSW called brother [name] to update pt dc plans.    Expected Discharge Plan: [place] Barriers to Discharge: Continued Medical Work up, Ship broker  Expected Discharge Plan and Services Expected Discharge Plan: [place] In-house Referral: Clinical Social Work     Living arrangements for the past 2 months: Single Family Home                                       Social Determinants of Health (SDOH) Interventions    Readmission Risk Interventions Readmission Risk Prevention Plan 03/05/2019 12/31/2018  Transportation Screening Complete Complete  PCP or Specialist Appt within 3-5 Days Complete Complete  HRI or [place] Complete Complete  Social Work Consult for [place] Planning/Counseling Complete Complete  Palliative Care Screening Complete Not Applicable  Medication Review Press photographer) Complete Complete  Some recent data might be hidden

## 2020-07-05 ENCOUNTER — Telehealth: Payer: Self-pay

## 2020-07-05 LAB — CBC
HCT: 30.4 % — ABNORMAL LOW (ref 39.0–52.0)
Hemoglobin: 9.2 g/dL — ABNORMAL LOW (ref 13.0–17.0)
MCH: 30.1 pg (ref 26.0–34.0)
MCHC: 30.3 g/dL (ref 30.0–36.0)
MCV: 99.3 fL (ref 80.0–100.0)
Platelets: 263 10*3/uL (ref 150–400)
RBC: 3.06 MIL/uL — ABNORMAL LOW (ref 4.22–5.81)
RDW: 18.5 % — ABNORMAL HIGH (ref 11.5–15.5)
WBC: 6.4 10*3/uL (ref 4.0–10.5)
nRBC: 0.3 % — ABNORMAL HIGH (ref 0.0–0.2)

## 2020-07-05 MED ORDER — LACTULOSE 10 GM/15ML PO SOLN
20.0000 g | Freq: Every day | ORAL | 0 refills | Status: DC
Start: 2020-07-05 — End: 2020-10-16

## 2020-07-05 MED ORDER — GUAIFENESIN-DM 100-10 MG/5ML PO SYRP
5.0000 mL | ORAL_SOLUTION | ORAL | Status: DC | PRN
  Administered 2020-07-05: 5 mL via ORAL
  Filled 2020-07-05: qty 5

## 2020-07-05 MED ORDER — SUCRALFATE 1 G PO TABS: 1.0000 g | ORAL_TABLET | Freq: Two times a day (BID) | ORAL | Status: AC

## 2020-07-05 NOTE — Progress Notes (Signed)
 Subjective:   Hospital day: 7  Overnight event: No acute event overnight  Patient reports feeling well today.  His appetite is normal.  Discussed plan for discharge today.  Patient understands the plan.  Objective:  Vital signs in last 24 hours: Vitals:   07/04/20 2119 07/04/20 2120 07/05/20 0006 07/05/20 0300  BP: 101/65 101/65 (!) 88/67 (!) 98/55  Pulse: 73 78 73 70  Resp:  15 20 20  Temp:  (!) 97.5 F (36.4 C) 97.8 F (36.6 C) 98.2 F (36.8 C)  TempSrc:  Oral Oral Oral  SpO2:  95% 98% 94%  Weight:    71.2 kg  Height:       CBC Latest Ref Rng & Units 07/05/2020 07/03/2020 07/02/2020  WBC 4.0 - 10.5 K/uL 6.4 6.3 6.4  Hemoglobin 13.0 - 17.0 g/dL 9.2(L) 8.7(L) 8.8(L)  Hematocrit 39 - 52 % 30.4(L) 28.2(L) 28.7(L)  Platelets 150 - 400 K/uL 263 264 269    Physical Exam  Physical Exam Constitutional:      General: He is not in acute distress.    Appearance: He is not toxic-appearing.  HENT:     Head: Normocephalic.  Eyes:     General:        Right eye: No discharge.        Left eye: No discharge.  Cardiovascular:     Rate and Rhythm: Normal rate and regular rhythm.     Heart sounds: Normal heart sounds.  Pulmonary:     Effort: No respiratory distress.     Breath sounds: Normal breath sounds.  Abdominal:     General: Bowel sounds are normal.     Palpations: Abdomen is soft.  Musculoskeletal:     Cervical back: Normal range of motion.     Right lower leg: Edema (Trace) present.     Left lower leg: Edema (Trace) present.  Skin:    General: Skin is warm.  Neurological:     Mental Status: He is alert.  Psychiatric:        Mood and Affect: Mood normal.     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  Principal Problem:   Symptomatic anemia Active Problems:   AKI (acute kidney injury) (HCC)   Acute on chronic systolic CHF (congestive heart failure) (HCC)   Cardiomyopathy, ischemic   Essential hypertension   Depression   CKD (chronic kidney disease), stage III (HCC)   ICD (implantable cardioverter-defibrillator) in place   Persistent atrial fibrillation (HCC)   Cirrhosis (HCC)   Hyponatremia   GI bleed   Pressure injury of skin  Symptomatic anemiasecondary to GI bleed-stable Hemoglobin trend 7.2-8.2-8.2-7.9-8.2-8.8-8.7-9.2. No overt sign of bleeding. Vital signs stable. Upper EGD on this admission only shows portal hypertensive gastropathy with no signs of bleeding.Continue sucralfate, Protonix and low-dose beta-blocker per GI recommendation. He will need outpatient colonoscopy with his primary gastroenterologist at [place].  Hemoglobin this morning of 9.2. -Protonix40 mg p.o.  -Carafate 1 g twice daily -Carvedilol 3.125 mgtwice daily -Continue hold Eliquisafter discharge until he sees his primary GI   Nonalcoholicliver cirrhosis Hepatic encephalopathy-resolved Functional decline Have history of liver cirrhosis, no history of alcohol. FAP was within normal limits. AST,ALT,alk phos within normal limits. Total bill mildly elevated 1.3. Abdomen is not distended, low suspicion for   ascites.His mental status is slightly back to baseline.No further eval. -Monitormental status -Lactulose 20 g daily -Patient is discharged to SNF for rehab.   Congestive heart failure with reduced ejection fraction-stable Echocardiogram in March 2021 shows EF 20 to 25%, global hypokinesis of LV, status post mitral valve repair.Currentlyno signs of respiratory distress or orthopnea. His blood pressureat baseline. Lower extremity edemastable.Encourage patient to increase p.o. intake and monitor oxygen status closely. Urine output of 1600 cc. -Torsemide 80 mg  daily -Strict I&O -Daily weights -EKG shows NSR with PVCs and RBBB which is similar to previous EKG.  QTc 549.  Her chart review, patient has had QT prolongation since August.  Patient will follow up with his cardiologist outpatient.  It appears like he had an appointment for 07/23/2020.   Chronic kidney disease stage III His creatinine normalized to baseline.Continuehis diuretic. -Avoid nephrotoxic medications   Hyponatremia-stable Patient has chronic low sodium level dates back in 2020. His baseline sodium is around 130. Etiology is likely poor p.o. intake versus SIADH.   Atrial fibrillation Restart amiodarone Holding Eliquis  Diet:Soft diet IVF:No VTE:SCDs due to GI bleed CODE:DNR  Prior to Admission Living Arrangement:Home Anticipated Discharge Location:SNF Barriers to Discharge:No Dispo: Anticipated discharge in approximatelytoday   , DO 07/05/2020, 6:14 AM Pager: [phone] After 5pm on weekdays and 1pm on weekends: On Call pager [phone] 

## 2020-07-05 NOTE — Progress Notes (Signed)
DC instructions prepared and ready for SNF.  Transport arranged.  No questions or concerns voiced at this time.  IV site dc'd without difficulty, patient tolerated without complaint.  Dressed and awaiting dc.

## 2020-07-05 NOTE — Telephone Encounter (Signed)
Done..  Pt appts has been sched as requested. Called and made pts brother [name] awake of pts upcoming appts.

## 2020-07-05 NOTE — Plan of Care (Signed)

## 2020-07-05 NOTE — Progress Notes (Signed)
Pt complaining of dry cough. ordered Robitussin DM from standing orders.

## 2020-07-05 NOTE — Progress Notes (Signed)
This chaplain responded to page from Pt. Brother-Carl [name].  The chaplain answered Carl's questions about Advance Directives.  The chaplain understands Carl [name] may pursue an AD for the Pt. when the Pt. arrives at [facility].  The chaplain offered spiritual care assistance as needed.

## 2020-07-05 NOTE — TOC Transition Note (Signed)
Transition of Care [facility]) - CM/SW Discharge Note   Patient Details  Name: NYKOLAS BACALLAO MRN: [id number] Date of Birth: Oct 30, 1950  [facility]) CM/SW Contact:  [address], [place] Phone Number: 07/05/2020, 9:48 AM   Clinical Narrative:    Patient will DC to: [facility] Anticipated DC date: 07/05/2020 Family notified: Caryl Asp Transport by: Corey Harold   Per MD patient ready for DC to [facility]. RN to call report prior to discharge [phone]. RN, patient, patient's family, and facility notified of DC. Discharge Summary and FL2 sent to facility. DC packet on chart. Ambulance transport requested for patient.   CSW will sign off for now as social work intervention is no longer needed. Please consult [place] again if new needs arise.     Final next level of care: Skilled Nursing Facility Barriers to Discharge: Barriers Resolved   Patient Goals and CMS Choice   CMS Medicare.gov Compare Post Acute Care list provided to:: Patient Represenative (must comment) [name], brother of pt) Choice offered to / list presented to : Sibling [name], brother)  Discharge Placement PASRR number recieved: 07/04/20            Patient chooses bed at: [facility] Patient to be transferred to facility by: [place] Name of family member notified: Rod Majerus Patient and family notified of of transfer: 07/05/20  Discharge Plan and Services In-house Referral: Clinical Social Work                                   Social Determinants of Health ([place]) Interventions     Readmission Risk Interventions Readmission Risk Prevention Plan 03/05/2019 12/31/2018  Transportation Screening Complete Complete  PCP or Specialist Appt within 3-5 Days Complete Complete  HRI or [place] Complete Complete  Social Work Consult for [place] Planning/Counseling Complete Complete  Palliative Care Screening Complete Not Applicable  Medication  Review Press photographer) Complete Complete  Some recent data might be hidden

## 2020-07-05 NOTE — Telephone Encounter (Signed)
Per Cornelia recevied by Minerva Ends: Patient brother LVM stating he was in the hospital and need to reschedule his appt for tomorrow, CB# [phone].   Per Dr. Tasia Catchings, postpone appts for 3 weeks.

## 2020-07-05 NOTE — Progress Notes (Signed)
D/C instructions printed and placed in packet at nurse's station along with DNR.

## 2020-07-06 ENCOUNTER — Inpatient Hospital Stay: Payer: Medicare Other | Admitting: Oncology

## 2020-07-06 ENCOUNTER — Inpatient Hospital Stay: Payer: Medicare Other

## 2020-07-09 ENCOUNTER — Encounter (HOSPITAL_COMMUNITY): Payer: Medicare Other

## 2020-07-11 ENCOUNTER — Other Ambulatory Visit: Payer: Medicare Other | Admitting: Orthotics

## 2020-07-12 ENCOUNTER — Other Ambulatory Visit: Payer: Self-pay

## 2020-07-12 ENCOUNTER — Encounter (HOSPITAL_COMMUNITY): Payer: Self-pay

## 2020-07-12 ENCOUNTER — Ambulatory Visit (HOSPITAL_COMMUNITY)
Admission: RE | Admit: 2020-07-12 | Discharge: 2020-07-12 | Disposition: A | Discharge status: Home / Self Care | Payer: Medicare Other | Source: Ambulatory Visit | Attending: Adult Health | Admitting: Adult Health

## 2020-07-12 VITALS — BP 102/80 | HR 76 | Wt 161.6 lb

## 2020-07-12 DIAGNOSIS — E611 Iron deficiency: Secondary | ICD-10-CM

## 2020-07-12 DIAGNOSIS — Z95810 Presence of automatic (implantable) cardiac defibrillator: Secondary | ICD-10-CM

## 2020-07-12 DIAGNOSIS — M6281 Muscle weakness (generalized): Secondary | ICD-10-CM | POA: Diagnosis not present

## 2020-07-12 DIAGNOSIS — Z79899 Other long term (current) drug therapy: Secondary | ICD-10-CM | POA: Diagnosis not present

## 2020-07-12 DIAGNOSIS — F70 Mild intellectual disabilities: Secondary | ICD-10-CM | POA: Diagnosis not present

## 2020-07-12 DIAGNOSIS — I255 Ischemic cardiomyopathy: Secondary | ICD-10-CM | POA: Insufficient documentation

## 2020-07-12 DIAGNOSIS — Z87891 Personal history of nicotine dependence: Secondary | ICD-10-CM | POA: Insufficient documentation

## 2020-07-12 DIAGNOSIS — Z951 Presence of aortocoronary bypass graft: Secondary | ICD-10-CM | POA: Diagnosis not present

## 2020-07-12 DIAGNOSIS — N182 Chronic kidney disease, stage 2 (mild): Secondary | ICD-10-CM | POA: Diagnosis not present

## 2020-07-12 DIAGNOSIS — N183 Chronic kidney disease, stage 3 unspecified: Secondary | ICD-10-CM | POA: Insufficient documentation

## 2020-07-12 DIAGNOSIS — F32A Depression, unspecified: Secondary | ICD-10-CM | POA: Diagnosis not present

## 2020-07-12 DIAGNOSIS — I480 Paroxysmal atrial fibrillation: Secondary | ICD-10-CM | POA: Diagnosis not present

## 2020-07-12 DIAGNOSIS — D649 Anemia, unspecified: Secondary | ICD-10-CM | POA: Insufficient documentation

## 2020-07-12 DIAGNOSIS — I5022 Chronic systolic (congestive) heart failure: Secondary | ICD-10-CM | POA: Insufficient documentation

## 2020-07-12 DIAGNOSIS — I2510 Atherosclerotic heart disease of native coronary artery without angina pectoris: Secondary | ICD-10-CM | POA: Diagnosis present

## 2020-07-12 DIAGNOSIS — K7460 Unspecified cirrhosis of liver: Secondary | ICD-10-CM | POA: Diagnosis not present

## 2020-07-12 DIAGNOSIS — Z8249 Family history of ischemic heart disease and other diseases of the circulatory system: Secondary | ICD-10-CM | POA: Insufficient documentation

## 2020-07-12 LAB — BASIC METABOLIC PANEL
Anion gap: 9 (ref 5–15)
BUN: 38 mg/dL — ABNORMAL HIGH (ref 8–23)
CO2: 30 mmol/L (ref 22–32)
Calcium: 9.1 mg/dL (ref 8.9–10.3)
Chloride: 93 mmol/L — ABNORMAL LOW (ref 98–111)
Creatinine, Ser: 2.26 mg/dL — ABNORMAL HIGH (ref 0.61–1.24)
GFR, Estimated: 31 mL/min — ABNORMAL LOW (ref >60)
Glucose, Bld: 112 mg/dL — ABNORMAL HIGH (ref 70–99)
Potassium: 4.1 mmol/L (ref 3.5–5.1)
Sodium: 132 mmol/L — ABNORMAL LOW (ref 135–145)

## 2020-07-12 LAB — CBC
HCT: 28.3 % — ABNORMAL LOW (ref 39.0–52.0)
Hemoglobin: 8.9 g/dL — ABNORMAL LOW (ref 13.0–17.0)
MCH: 31.1 pg (ref 26.0–34.0)
MCHC: 31.4 g/dL (ref 30.0–36.0)
MCV: 99 fL (ref 80.0–100.0)
Platelets: 255 10*3/uL (ref 150–400)
RBC: 2.86 MIL/uL — ABNORMAL LOW (ref 4.22–5.81)
RDW: 18.7 % — ABNORMAL HIGH (ref 11.5–15.5)
WBC: 4.4 10*3/uL (ref 4.0–10.5)
nRBC: 0 % (ref 0.0–0.2)

## 2020-07-12 MED ORDER — TORSEMIDE 20 MG PO TABS
80.0000 mg | ORAL_TABLET | Freq: Every day | ORAL | 1 refills | Status: DC
Start: 2020-07-12 — End: 2020-07-31

## 2020-07-12 MED ORDER — METOLAZONE 2.5 MG PO TABS
ORAL_TABLET | ORAL | 3 refills | Status: DC
Start: 2020-07-12 — End: 2020-07-30
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
## 2020-07-12 NOTE — Patient Instructions (Addendum)
Lab work done today. We will notify you of any abnormal lab work. No news is good news!  FOR TWO DAYS ONLY Take Torsemide 80mg  (4 tabs) two times daily FOR TWO DAYS ONLY.  Take Metolazone 2.5mg  tab every Thursday.  Please follow up with the [facility] in 3-4 weeks.  At the [facility], you and your health needs are our priority. As part of our continuing mission to provide you with exceptional heart care, we have created designated Provider Care Teams. These Care Teams include your primary Cardiologist (physician) and Advanced Practice Providers (APPs- Physician Assistants and Nurse Practitioners) who all work together to provide you with the care you need, when you need it.   You may see any of the following providers on your designated Care Team at your next follow up: Marland Kitchen Dr [name] . Dr [name] . Darrick Grinder, NP . Lyda Jester, PA . Audry Riles, PharmD   Please be sure to bring in all your medications bottles to every appointment.

## 2020-07-13 ENCOUNTER — Encounter: Payer: Self-pay | Admitting: Physician Assistant

## 2020-07-13 ENCOUNTER — Ambulatory Visit (INDEPENDENT_AMBULATORY_CARE_PROVIDER_SITE_OTHER): Payer: Medicare Other | Admitting: Physician Assistant

## 2020-07-13 VITALS — BP 76/50 | HR 84 | Ht 72.0 in | Wt 164.0 lb

## 2020-07-13 DIAGNOSIS — I480 Paroxysmal atrial fibrillation: Secondary | ICD-10-CM

## 2020-07-13 DIAGNOSIS — I255 Ischemic cardiomyopathy: Secondary | ICD-10-CM | POA: Diagnosis not present

## 2020-07-13 DIAGNOSIS — I5022 Chronic systolic (congestive) heart failure: Secondary | ICD-10-CM | POA: Diagnosis not present

## 2020-07-13 DIAGNOSIS — Z95810 Presence of automatic (implantable) cardiac defibrillator: Secondary | ICD-10-CM | POA: Diagnosis not present

## 2020-07-13 DIAGNOSIS — I2510 Atherosclerotic heart disease of native coronary artery without angina pectoris: Secondary | ICD-10-CM

## 2020-07-13 NOTE — Progress Notes (Signed)
Cardiology Office Note Date:  07/13/2020  Patient ID:  Carl [name], Carl [name] 05/08/1951, MRN [id number] PCP:  [name], MD  Cardiologist:  Dr. [name] EP:  Dr. Rayann Heman    Chief Complaint:  f/u post hospital   History of Present Illness: FERMON URETA is a 69 y.o. 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 Lynnell Jude, NP, he was s/p hip fracture, doing fairly well with his recovery, ? AF on his device, though ECG noted PACs, given single camber device, unable to know for sure. No changes were made He had No showed to a telehealth visit with Dr. Rayann Heman last month.  Via [facility] through the HF clinic he was found to have AFib March 2021, also to have PVC burden of 11% Started on eliquis, underwent TEE/DCCV with ERAF a couple weeks later.  Referred to AF clinic planned for repeat DCCV given had been on amio a few weeks, HR was 62.  DCCV 12/21/19: successful  TEE march noted LVEF 20-25%, mod reduced RVEF, mod enlarged RA, LA mod enlarged, mod MR, amplatzer device without shunting  I saw him May 2021 He comes today accompanied by his sister.  The patient feels well, no CP, palpitations or cardiac awareness since his DCCV.  He has no rest SOB, minimal DOE.  No near syncope or syncope, no shocks.  He has fallen a couple times in the last few months, his sister explains that he has an old orthopedic problem with his leg that has been broken a couple times and never healed right and this gives him trouble, but since the DCCV she thinks he looks better and more steady on his feet then he has in the prior several weeks.  He is certain these are not dizzy spells, or syncope.  He says he has gotten  off balance and and could not catch himself.  Denies head trauma  But scraped his arm a couple times. He saw his PMD yesterday who started him on antibiotic for a skin infection on his leg, is scheduled for LE venous US He was also found to have a L breast lump and recommended to have mammogram He is doing OK with his medicines, paramedicine visit him.  He denies any bleeding or signs of bleeding Device check looked OK< no changes were made, he was planned to see HF team soon. Sent my note to both Dr. Rayann Heman and Dr. [name].   Some concern of amiodarone tx with his age and hx of cirrhosis  Admitted with symptomatic anemia 2/2 GIB. Hgb down to 6.5 Eliquis stopped. Planning for colonoscopy. Also had AKI and hypotension so diuretics held. Later torsemide was restarted (80 mg daily). He was discharged to [facility]. He was also noted to be somewhat encephalopathic, GI recommended starting lactulose 20 g daily given elevated ammonia of 60 with asterixison exam.  His mental status improved after given lactulose and improvement of hemoglobin. Discharged 07/05/20  He saw HF team yesterday, since his hospital stay was off dig, losartan, eplerenone, found volume OL, no med list accompanied him, rx torsemide 80 mg twice a day x2 days and then take 2.5 mg metolazone today Planned for labs and 3-4  week follow up.  TODAY He comes today unaccompanied, dropped off by SNF transportation. He tells me he is doing OK, denies any CP, no SOB, denies any trouble sleeping or breathing at night. He is alert, very interactive, tells me accurately much of his PMHx, and that he is pending cataract surgery soon. He tells me it is OK to call his sister if I need to. He denies any fainting spells, unsure about GI follow up, but says he he has never noted an bleeding n they don't where he bleed from. He mentions he would like to get a calender in his room     AFib Diagnosed Feb 2021 GIB Nov 2021 hgb 6.5 and Eliquis  stopped pending outpt GI w/u   AAD Hx Amiodarone started Feb 2021  Device History: MDT single chamber ICD implanted 2016 for ICM   Past Medical History:  Diagnosis Date  . AICD (automatic cardioverter/defibrillator) present   . Anemia   . Anxiety   . Arthritis   . Brunner's gland hyperplasia of duodenum   . CHF (congestive heart failure) ([place])   . Coronary artery disease   . External hemorrhoids   . Fracture of femoral neck, left ([place]) 07/21/2018  . GERD (gastroesophageal reflux disease)   . Hearing loss   . HH (hiatus hernia)   . Internal hemorrhoids   . Ischemic cardiomyopathy   . Leg fracture, left   . Myocardial infarction ([place])   . Paronychia   . Pneumonia   . Psoriasis   . PUD (peptic ulcer disease)   . Schatzki's ring   . Tubular adenoma of colon   . Vitiligo     Past Surgical History:  Procedure Laterality Date  . ATRIAL SEPTAL DEFECT(ASD) CLOSURE N/A 12/30/2018   Procedure: ATRIAL SEPTAL DEFECT(ASD) CLOSURE;  Surgeon: [name], MD;  Location: [place] [facility];  Service: Cardiovascular;  Laterality: N/A;  . CARDIAC SURGERY    . CARDIOVERSION N/A 11/09/2019   Procedure: CARDIOVERSION;  Surgeon: [name], MD;  Location: [facility] ENDOSCOPY;  Service: Cardiovascular;  Laterality: N/A;  . CARDIOVERSION N/A 12/21/2019   Procedure: CARDIOVERSION;  Surgeon: [name], MD;  Location: [facility] ENDOSCOPY;  Service: Cardiovascular;  Laterality: N/A;  . COLONOSCOPY WITH ESOPHAGOGASTRODUODENOSCOPY (EGD)    . COLONOSCOPY WITH PROPOFOL N/A 08/24/2017   Procedure: COLONOSCOPY WITH PROPOFOL;  Surgeon: [name], MD;  Location: [facility] ENDOSCOPY;  Service: Endoscopy;  Laterality: N/A;  . CORONARY ARTERY BYPASS GRAFT N/A 08/03/2014   Procedure: CORONARY ARTERY BYPASS GRAFTING (CABG);  Surgeon: [name], MD;  Location: [place];  Service: Open Heart Surgery;  Laterality: N/A;  . EP IMPLANTABLE DEVICE N/A 05/01/2015   MDT ICD implanted for primary prevention of  sudden death  . ESOPHAGOGASTRODUODENOSCOPY N/A 06/28/2020   Procedure: ESOPHAGOGASTRODUODENOSCOPY (EGD);  Surgeon: Clarene Essex, MD;  Location: [name];  Service: Endoscopy;  Laterality: N/A;  . ESOPHAGOGASTRODUODENOSCOPY (EGD) WITH PROPOFOL N/A 04/16/2015   Procedure: ESOPHAGOGASTRODUODENOSCOPY (EGD) WITH PROPOFOL;  Surgeon: [name], MD;  Location: [facility] ENDOSCOPY;  Service: Endoscopy;  Laterality: N/A;  . ESOPHAGOGASTRODUODENOSCOPY (EGD) WITH PROPOFOL N/A 08/24/2017   Procedure: ESOPHAGOGASTRODUODENOSCOPY (EGD) WITH PROPOFOL;  Surgeon: [name], MD;  Location: [facility] ENDOSCOPY;  Service: Endoscopy;  Laterality: N/A;  . FRACTURE SURGERY    . HERNIA REPAIR    . HIP PINNING,CANNULATED Left 07/22/2018   Procedure: CANNULATED HIP PINNING;  Surgeon: [name], MD;  Location: [place];  Service: Orthopedics;  Laterality: Left;  .  MITRAL VALVE REPAIR N/A 12/30/2018   Procedure: MITRAL VALVE REPAIR;  Surgeon: [name], MD;  Location: [place] CV LAB;  Service: Cardiovascular;  Laterality: N/A;  . RIGHT/LEFT HEART CATH AND CORONARY/GRAFT ANGIOGRAPHY N/A 12/21/2018   Procedure: RIGHT/LEFT HEART CATH AND CORONARY/GRAFT ANGIOGRAPHY;  Surgeon: [name], MD;  Location: [place] CV LAB;  Service: Cardiovascular;  Laterality: N/A;  . TEE WITHOUT CARDIOVERSION N/A 08/03/2014   Procedure: TRANSESOPHAGEAL ECHOCARDIOGRAM (TEE);  Surgeon: [name], MD;  Location: [place];  Service: Open Heart Surgery;  Laterality: N/A;  . TEE WITHOUT CARDIOVERSION N/A 02/10/2018   Procedure: TRANSESOPHAGEAL ECHOCARDIOGRAM (TEE);  Surgeon: [name], MD;  Location: [facility] ENDOSCOPY;  Service: Cardiovascular;  Laterality: N/A;  . TEE WITHOUT CARDIOVERSION N/A 11/09/2019   Procedure: TRANSESOPHAGEAL ECHOCARDIOGRAM (TEE);  Surgeon: [name], MD;  Location: [facility] ENDOSCOPY;  Service: Cardiovascular;  Laterality: N/A;    Current Outpatient Medications  Medication Sig Dispense Refill  .  acetaminophen (TYLENOL) 500 MG tablet Take 500-1,000 mg by mouth every 6 (six) hours as needed (for pain.).    Marland Kitchen allopurinol (ZYLOPRIM) 100 MG tablet Take 100 mg by mouth daily.     Marland Kitchen amiodarone (PACERONE) 200 MG tablet Take 1 tablet (200 mg total) by mouth daily. 90 tablet 1  . Ascorbic Acid (VITAMIN C) 100 MG tablet Take 100 mg by mouth daily.    Marland Kitchen atorvastatin (LIPITOR) 40 MG tablet TAKE 1 TABLET (40 MG TOTAL) BY MOUTH DAILY AT 6 PM. (Patient taking differently: Take 40 mg by mouth at bedtime. ) 90 tablet 3  . carvedilol (COREG) 3.125 MG tablet Take 1 tablet (3.125 mg total) by mouth 2 (two) times daily. 60 tablet 3  . Cholecalciferol (VITAMIN D3 SUPER STRENGTH) 50 MCG (2000 UT) TABS Take 2,000 Units by mouth at bedtime.     . cyanocobalamin 500 MCG tablet Take 500 mcg by mouth at bedtime.     . docusate sodium (COLACE) 100 MG capsule Take 100 mg by mouth daily as needed for mild constipation.    . DULoxetine (CYMBALTA) 30 MG capsule Take 30 mg by mouth every evening.     . ferrous sulfate 325 (65 FE) MG tablet Take 1 tablet (325 mg total) by mouth daily with breakfast. (Patient taking differently: Take 325 mg by mouth daily. ) 30 tablet 3  . gabapentin (NEURONTIN) 300 MG capsule Take 300 mg by mouth at bedtime.     Marland Kitchen ketoconazole (NIZORAL) 2 % shampoo Apply 1 application topically 2 (two) times a week.     . lactulose (CHRONULAC) 10 GM/15ML solution Take 30 mLs (20 g total) by mouth daily. 236 mL 0  . metolazone (ZAROXOLYN) 2.5 MG tablet Take 1 tab every Thursday. Take 20 mEq Potassium with Metolazone. 12 tablet 3  . Multiple Vitamin (MULTIVITAMIN) tablet Take 1 tablet by mouth at bedtime.     . pantoprazole (PROTONIX) 40 MG tablet TAKE 1 TABLET BY MOUTH EVERY DAY (Patient taking differently: Take 40 mg by mouth daily. ) 90 tablet 2  . potassium chloride SA (KLOR-CON M20) 20 MEQ tablet Take 1 tablet (20 mEq total) by mouth daily. 30 tablet 3  . sucralfate (CARAFATE) 1 g tablet Take 1 tablet (1  g total) by mouth 2 (two) times daily.    Marland Kitchen torsemide (DEMADEX) 20 MG tablet Take 4 tablets (80 mg total) by mouth daily. 120 tablet 1   No current facility-administered medications for this visit.    Allergies:  Spironolactone   Social History:  The patient  reports that he quit smoking about 7 years ago. He has never used smokeless tobacco. He reports that he does not drink alcohol and does not use drugs.   Family History:  The patient's family history includes CAD in his father; Diabetes in his brother; Heart Problems in his brother; Valvular heart disease in his mother.  ROS:  Please see the history of present illness.  All other systems are reviewed and otherwise negative.   PHYSICAL EXAM: VS:  There were no vitals taken for this visit. BMI: There is no height or weight on file to calculate BMI. Well nourished, well developed, in no acute distress  HEENT: normocephalic, atraumatic  Neck: no JVD, carotid bruits or masses Cardiac: RRR; soft SM, no rubs, or gallops Lungs:  CTA b/l, no wheezing, rhonchi or rales  Abd: soft, nontender MS: no deformity, advanced atrophy for age Ext: 1-2+ edema b/l to just below the knees Skin: warm and dry, no rash, vitiligo noted Neuro:  No gross deficits appreciated Psych: euthymic mood, full affect  ICD site is stable, no tethering or discomfort   EKG:  Not done today  ICD interrogation done today and reviewed by myself: Rhythm is regualr (single chamber device) Battery and lead measurements are good No AF (visa AF ICD) with 0% burden  Not since his device interrogation done yesterday though also noted A. Clegg, NP noted from yesterday, also no arrhythmias when device interrogated there.    11/09/2019: TEE IMPRESSIONS  1. Left ventricular ejection fraction, by estimation, is 20 to 25%. The  left ventricle has severely decreased function. The left ventricle  demonstrates global hypokinesis. The left ventricular internal cavity size   was moderately dilated.  2. Right ventricular systolic function is moderately reduced. The right  ventricular size is mildly enlarged. There is mildly elevated pulmonary  artery systolic pressure.  3. There was an Amplatzer atrial septal closure device present, no  shunting noted by color doppler. Left atrial size was moderately dilated.  No left atrial/left atrial appendage thrombus was detected.  4. Right atrial size was moderately dilated.  5. Peak RV-RA gradient 24 mmHg. Tricuspid valve regurgitation is moderate  to severe.  6. The aortic valve is tricuspid. Aortic valve regurgitation is trivial.  No aortic stenosis is present.  7. The mitral valve was s/p repair with 2 Mitraclip devices placed in the  A2-P2 area. There was moderate residual MR with PISA ERO 0.25 cm^2. Mean  gradient 3 mmHg across the mitral valve, no significant stenosis. No  pulmonary vein systolic flow reversal  noted.  8. Normal caliber thoracic aorta with mild plaque.    12/21/2018; LHC  Ost LAD to Prox LAD lesion is 100% stenosed.  Mid LM to Dist LM lesion is 99% stenosed.  Ost Cx to Prox Cx lesion is 100% stenosed.  Prox RCA lesion is 100% stenosed.  Seq SVG- OM1 and OM2 graft was visualized by angiography and is normal in caliber.  SVG graft was visualized by angiography and is normal in caliber.  SVG graft was visualized by angiography and is normal in caliber.  LIMA graft was visualized by angiography and is normal in caliber.  Hemodynamic findings consistent with mitral valve regurgitation.   1.  Severe native vessel CAD with total occlusion of the RCA, LAD, and left circumflex and severe distal left main stenosis 2.  Status post multivessel CABG with continued patency of the LIMA to LAD, saphenous vein  graft to first diagonal, sequential saphenous vein graft to OM 2 and OM 3, and saphenous vein graft to PDA 3.  Preserved cardiac output 4.  Large V waves consistent with severe mitral  regurgitation  Recommendations: Continued evaluation by the multidisciplinary heart team for treatment options related to severe mitral insufficiency and acute on chronic systolic heart failure  Recent Labs: 04/20/2020: TSH 2.047 06/27/2020: ALT 22; B Natriuretic Peptide 1,583.2 07/12/2020: BUN 38; Creatinine, Ser 2.26; Hemoglobin 8.9; Platelets 255; Potassium 4.1; Sodium 132  09/20/2019: Cholesterol 138; HDL 48; LDL Cholesterol 78; Total CHOL/HDL Ratio 2.9; Triglycerides 61; VLDL 12   Estimated Creatinine Clearance: 32.4 mL/min (A) (by C-G formula based on SCr of 2.26 mg/dL (H)).   Wt Readings from Last 3 Encounters:  07/12/20 161 lb 9.6 oz (73.3 kg)  07/05/20 157 lb (71.2 kg)  06/25/20 164 lb (74.4 kg)     Other studies reviewed: Additional studies/records reviewed today include: summarized above  ASSESSMENT AND PLAN:  1. ICD     Intact function, no programming changes made  2. ICM 3. Chronic CHF     C/w AHF team     Recently resumed on diuterics     F/u with AHF team is in placed  4. CAD     No anginal symptoms     C/w Dr. [name]  5. VHD, s/p Mitra clip 2020 6. ASD s/p Amplatzer closure     C/w Dr. [name]  7. Paroxysmal Afib     CHA2DS2Vasc is 3, currently off Eliquis currently with recent GIB 8. PVCs     Started on amiodarone feb 2021      He has hx of cirrhosis, sounds like he got into a little issue with this during his hospital stay, was followed by GI while there and treated with lactulose. LFTs looked OK His amiodarone was continued. Long term I do not think this is a great choice, though is maintaining SR and right now while off a/c, would continue  I called Radiation protection practitioner and spoke with Sport and exercise psychologist?) His BP there at base line similar high 80's-100 or so No symptoms, falls or issues reported by staff or there They had not yet got him GI follow up scheduled, but she was ging to follow up on that Discussed with AHF NP as well, continued his  current diuretic plan The patient is not symptomatic with his BP His sister happened to call him while he was here and I spoke with her as well She was waiting for him at the SNF to bring him to get shoes.  I updated her on his visit with me I told the patient to ask [name] if they could get a calender.  Plan follow up with the HF team as scheduled, remote device checks as usual and EP in 37mo otherwise, sooner if needed    Disposition: as above   Current medicines are reviewed at length with the patient today.  The patient did not have any concerns regarding medicines.  Venetia Night, PA-C 07/13/2020 4:51 AM     [facility] [place] [address] [phone] (office)  [phone] (fax)

## 2020-07-13 NOTE — Patient Instructions (Signed)
Medication Instructions:   Your physician recommends that you continue on your current medications as directed. Please refer to the Current Medication list given to you today.   *If you need a refill on your cardiac medications before your next appointment, please call your pharmacy*   Lab Work: [place]   If you have labs (blood work) drawn today and your tests are completely normal, you will receive your results only by: Marland Kitchen MyChart Message (if you have MyChart) OR . A paper copy in the mail If you have any lab test that is abnormal or we need to change your treatment, we will call you to review the results.   Testing/Procedures: NONE ORDERED  TODAY   Follow-Up: At [facility], you and your health needs are our priority.  As part of our continuing mission to provide you with exceptional heart care, we have created designated Provider Care Teams.  These Care Teams include your primary Cardiologist (physician) and Advanced Practice Providers (APPs -  Physician Assistants and Nurse Practitioners) who all work together to provide you with the care you need, when you need it.  We recommend signing up for the patient portal called "MyChart".  Sign up information is provided on this After Visit Summary.  MyChart is used to connect with patients for Virtual Visits (Telemedicine).  Patients are able to view lab/test results, encounter notes, upcoming appointments, etc.  Non-urgent messages can be sent to your provider as well.   To learn more about what you can do with MyChart, go to [email or web address].    Your next appointment:   6 month(s)  The format for your next appointment:   In Person  Provider:   Thompson Grayer, MD

## 2020-07-16 ENCOUNTER — Telehealth (HOSPITAL_COMMUNITY): Payer: Self-pay | Admitting: Cardiology

## 2020-07-16 ENCOUNTER — Ambulatory Visit: Payer: Medicare Other | Admitting: Podiatry

## 2020-07-16 NOTE — Telephone Encounter (Signed)
Spoke with [name] and informed him we could not send orders to extend rehab stay until after cataract surgery. [name] said that rehab will keep him longer but he would have to pay out of pocket. I advised per Adline Potter that pt speak to social work at [facility].

## 2020-07-16 NOTE — Telephone Encounter (Signed)
Brother of the patient called. The patient needs Dr. [name] to extend the orders for the patient to stay in rehab  At [facility] until after he has his Cataract Surgery Tuesday 07/26/20.  The patient has had multiple falls and the [facility] thinks that the reason the patient keeps falling is due to the poor vision.

## 2020-07-17 ENCOUNTER — Telehealth (HOSPITAL_COMMUNITY): Payer: Self-pay | Admitting: *Deleted

## 2020-07-17 ENCOUNTER — Other Ambulatory Visit: Payer: Medicare Other | Admitting: Nurse Practitioner

## 2020-07-17 NOTE — Telephone Encounter (Signed)
Received fax from [facility], pt needs clearance for cataract removal  Per Dr [name]: pt cleared for surgery  Form faxed back to them at [phone]

## 2020-07-18 ENCOUNTER — Telehealth: Payer: Self-pay | Admitting: Internal Medicine

## 2020-07-18 NOTE — Telephone Encounter (Signed)
[facility] is calling to see if they can get a copy of when defibrilator was put in so they can send it to the hospital so he can have surgery. Please call.

## 2020-07-18 NOTE — Telephone Encounter (Signed)
[facility], closed until 07/23/20. Will contact office Monday.

## 2020-07-20 ENCOUNTER — Other Ambulatory Visit
Admission: RE | Admit: 2020-07-20 | Discharge: 2020-07-20 | Disposition: A | Discharge status: Home / Self Care | Payer: Medicare Other | Source: Ambulatory Visit | Attending: Internal Medicine | Admitting: Internal Medicine

## 2020-07-20 ENCOUNTER — Other Ambulatory Visit: Payer: Self-pay

## 2020-07-20 DIAGNOSIS — Z01812 Encounter for preprocedural laboratory examination: Secondary | ICD-10-CM | POA: Insufficient documentation

## 2020-07-20 DIAGNOSIS — Z20822 Contact with and (suspected) exposure to covid-19: Secondary | ICD-10-CM | POA: Insufficient documentation

## 2020-07-21 LAB — SARS CORONAVIRUS 2 (TAT 6-24 HRS): SARS Coronavirus 2: NEGATIVE

## 2020-07-23 ENCOUNTER — Other Ambulatory Visit: Payer: Self-pay

## 2020-07-23 ENCOUNTER — Ambulatory Visit (INDEPENDENT_AMBULATORY_CARE_PROVIDER_SITE_OTHER): Payer: Medicare Other

## 2020-07-23 ENCOUNTER — Ambulatory Visit
Admission: RE | Admit: 2020-07-23 | Discharge: 2020-07-23 | Disposition: A | Discharge status: Home / Self Care | Payer: Medicare Other | Attending: Internal Medicine | Admitting: Internal Medicine

## 2020-07-23 ENCOUNTER — Ambulatory Visit: Payer: Medicare Other | Admitting: Certified Registered Nurse Anesthetist

## 2020-07-23 ENCOUNTER — Encounter
Admission: RE | Disposition: A | Discharge status: Home / Self Care | Payer: Self-pay | Source: Home / Self Care | Attending: Internal Medicine

## 2020-07-23 ENCOUNTER — Encounter: Payer: Self-pay | Admitting: Internal Medicine

## 2020-07-23 DIAGNOSIS — K635 Polyp of colon: Secondary | ICD-10-CM | POA: Diagnosis not present

## 2020-07-23 DIAGNOSIS — K640 First degree hemorrhoids: Secondary | ICD-10-CM | POA: Diagnosis not present

## 2020-07-23 DIAGNOSIS — K766 Portal hypertension: Secondary | ICD-10-CM | POA: Diagnosis not present

## 2020-07-23 DIAGNOSIS — I5022 Chronic systolic (congestive) heart failure: Secondary | ICD-10-CM

## 2020-07-23 DIAGNOSIS — Z79899 Other long term (current) drug therapy: Secondary | ICD-10-CM | POA: Insufficient documentation

## 2020-07-23 DIAGNOSIS — Z87891 Personal history of nicotine dependence: Secondary | ICD-10-CM | POA: Diagnosis not present

## 2020-07-23 DIAGNOSIS — Z888 Allergy status to other drugs, medicaments and biological substances status: Secondary | ICD-10-CM | POA: Insufficient documentation

## 2020-07-23 DIAGNOSIS — D500 Iron deficiency anemia secondary to blood loss (chronic): Secondary | ICD-10-CM | POA: Diagnosis present

## 2020-07-23 DIAGNOSIS — Z8719 Personal history of other diseases of the digestive system: Secondary | ICD-10-CM | POA: Diagnosis present

## 2020-07-23 DIAGNOSIS — K3189 Other diseases of stomach and duodenum: Secondary | ICD-10-CM | POA: Diagnosis not present

## 2020-07-23 DIAGNOSIS — Z95810 Presence of automatic (implantable) cardiac defibrillator: Secondary | ICD-10-CM | POA: Diagnosis not present

## 2020-07-23 DIAGNOSIS — K7460 Unspecified cirrhosis of liver: Secondary | ICD-10-CM | POA: Diagnosis not present

## 2020-07-23 DIAGNOSIS — K5730 Diverticulosis of large intestine without perforation or abscess without bleeding: Secondary | ICD-10-CM | POA: Insufficient documentation

## 2020-07-23 DIAGNOSIS — K7581 Nonalcoholic steatohepatitis (NASH): Secondary | ICD-10-CM | POA: Diagnosis not present

## 2020-07-23 HISTORY — PX: COLONOSCOPY WITH PROPOFOL: SHX5780

## 2020-07-23 SURGERY — COLONOSCOPY WITH PROPOFOL
Anesthesia: General

## 2020-07-23 MED ORDER — EPHEDRINE 5 MG/ML INJ
INTRAVENOUS | Status: AC
  Filled 2020-07-23: qty 10

## 2020-07-23 MED ORDER — LIDOCAINE HCL (PF) 2 % IJ SOLN
INTRAMUSCULAR | Status: AC
  Filled 2020-07-23: qty 5

## 2020-07-23 MED ORDER — PHENYLEPHRINE HCL (PRESSORS) 10 MG/ML IV SOLN
INTRAVENOUS | Status: DC | PRN
  Administered 2020-07-23 (×2): 100 ug via INTRAVENOUS
  Administered 2020-07-23 (×2): 200 ug via INTRAVENOUS
  Administered 2020-07-23 (×2): 100 ug via INTRAVENOUS

## 2020-07-23 MED ORDER — SODIUM CHLORIDE 0.9 % IV SOLN: INTRAVENOUS | Status: DC

## 2020-07-23 MED ORDER — HEPARIN SOD (PORK) LOCK FLUSH 100 UNIT/ML IV SOLN
INTRAVENOUS | Status: AC
  Filled 2020-07-23: qty 5

## 2020-07-23 MED ORDER — PROPOFOL 10 MG/ML IV BOLUS
INTRAVENOUS | Status: AC
  Filled 2020-07-23: qty 20

## 2020-07-23 MED ORDER — PROPOFOL 10 MG/ML IV BOLUS
INTRAVENOUS | Status: DC | PRN
  Administered 2020-07-23 (×3): 10 mg via INTRAVENOUS

## 2020-07-23 MED ORDER — LIDOCAINE HCL (CARDIAC) PF 100 MG/5ML IV SOSY
PREFILLED_SYRINGE | INTRAVENOUS | Status: DC | PRN
  Administered 2020-07-23: 40 mg via INTRAVENOUS

## 2020-07-23 MED ORDER — EPHEDRINE SULFATE 50 MG/ML IJ SOLN
INTRAMUSCULAR | Status: DC | PRN
  Administered 2020-07-23: 10 mg via INTRAVENOUS
  Administered 2020-07-23: 5 mg via INTRAVENOUS
  Administered 2020-07-23 (×2): 10 mg via INTRAVENOUS
  Administered 2020-07-23 (×3): 5 mg via INTRAVENOUS

## 2020-07-23 MED ORDER — PROPOFOL 500 MG/50ML IV EMUL
INTRAVENOUS | Status: DC | PRN
  Administered 2020-07-23: 50 ug/kg/min via INTRAVENOUS

## 2020-07-23 MED ORDER — KETAMINE HCL 50 MG/ML IJ SOLN
INTRAMUSCULAR | Status: AC
  Filled 2020-07-23: qty 10

## 2020-07-23 NOTE — Interval H&P Note (Signed)
History and Physical Interval Note:  07/23/2020 1:35 PM  Carl [name]  has presented today for surgery, with the diagnosis of ANEMIA,HX.OF AVM'S.  The various methods of treatment have been discussed with the patient and family. After consideration of risks, benefits and other options for treatment, the patient has consented to  Procedure(s): COLONOSCOPY WITH PROPOFOL (N/A) as a surgical intervention.  The patient's history has been reviewed, patient examined, no change in status, stable for surgery.  I have reviewed the patient's chart and labs.  Questions were answered to the patient's satisfaction.     [place], [place]

## 2020-07-23 NOTE — Telephone Encounter (Signed)
Spoke with Nevin Bloodgood at [facility]. Patient is having surgery at [facility] and info requested on patient's device. Informed perioperative prescription form should be faxed to [facility] to provide safe care of ICD during surgery. Advised that [facility] faxes a copy of the form to this office prior to surgery to  Complete. Fax # and phone # provided for DC.

## 2020-07-23 NOTE — H&P (Signed)
Outpatient short stay form Pre-procedure 07/23/2020 1:32 PM Carl [name] K. [name], M.D.  Primary Physician: [name], M.D.  Reason for visit:  Iron deficiency anemia, history of colonic AVM's  History of present illness:  Patient with history of NASH cirrhosis, systolic CHF, portal hypertensive gastropathy, and colonic AVMs presents with Iron deficiency anemia without gross complaint of hematochezia, melena or hemetemesis.Last EGD and colonoscopy were in 07/2017 showing Portal HTN gastropathy and colonic AVM's which were cauterized.     Current Facility-Administered Medications:  .  0.9 %  sodium chloride infusion, , Intravenous, Continuous, [name], [name], MD  Medications Prior to Admission  Medication Sig Dispense Refill Last Dose  . acetaminophen (TYLENOL) 500 MG tablet Take 500-1,000 mg by mouth every 6 (six) hours as needed (for pain.).     Marland Kitchen allopurinol (ZYLOPRIM) 100 MG tablet Take 100 mg by mouth daily.      Marland Kitchen amiodarone (PACERONE) 200 MG tablet Take 1 tablet (200 mg total) by mouth daily. 90 tablet 1   . Ascorbic Acid (VITAMIN C) 100 MG tablet Take 100 mg by mouth daily.     Marland Kitchen atorvastatin (LIPITOR) 40 MG tablet Take 40 mg by mouth every evening.     . carvedilol (COREG) 3.125 MG tablet Take 1 tablet (3.125 mg total) by mouth 2 (two) times daily. 60 tablet 3   . Cholecalciferol (VITAMIN D3 SUPER STRENGTH) 50 MCG (2000 UT) TABS Take 2,000 Units by mouth at bedtime.      . cyanocobalamin 500 MCG tablet Take 500 mcg by mouth daily.      Marland Kitchen docusate sodium (COLACE) 100 MG capsule Take 100 mg by mouth daily as needed for mild constipation.     . DULoxetine (CYMBALTA) 30 MG capsule Take 30 mg by mouth at bedtime.      . ferrous sulfate 325 (65 FE) MG tablet Take 1 tablet (325 mg total) by mouth daily with breakfast. 30 tablet 3   . gabapentin (NEURONTIN) 300 MG capsule Take 300 mg by mouth at bedtime.      Marland Kitchen ketoconazole (NIZORAL) 2 % shampoo Apply 1 application topically 2  (two) times a week. Mon and Thu     . lactulose (CHRONULAC) 10 GM/15ML solution Take 30 mLs (20 g total) by mouth daily. 236 mL 0   . metolazone (ZAROXOLYN) 2.5 MG tablet Take 1 tab every Thursday. Take 20 mEq Potassium with Metolazone. (Patient taking differently: Take 2.5 mg by mouth every Thursday. ) 12 tablet 3   . Multiple Vitamin (MULTIVITAMIN) tablet Take 1 tablet by mouth at bedtime.      . pantoprazole (PROTONIX) 40 MG tablet TAKE 1 TABLET BY MOUTH EVERY DAY (Patient taking differently: Take 40 mg by mouth daily. ) 90 tablet 2   . potassium chloride SA (KLOR-CON M20) 20 MEQ tablet Take 1 tablet (20 mEq total) by mouth daily. 30 tablet 3   . sucralfate (CARAFATE) 1 g tablet Take 1 tablet (1 g total) by mouth 2 (two) times daily.     Marland Kitchen torsemide (DEMADEX) 20 MG tablet Take 4 tablets (80 mg total) by mouth daily. 120 tablet 1      Allergies  Allergen Reactions  . Spironolactone Other (See Comments)    gynecomastia      Past Medical History:  Diagnosis Date  . AICD (automatic cardioverter/defibrillator) present   . Anemia   . Anxiety   . Arthritis   . Brunner's gland hyperplasia of duodenum   . CHF (congestive heart  failure) ([place])   . Coronary artery disease   . External hemorrhoids   . Fracture of femoral neck, left ([place]) 07/21/2018  . GERD (gastroesophageal reflux disease)   . Hearing loss   . HH (hiatus hernia)   . Internal hemorrhoids   . Ischemic cardiomyopathy   . Leg fracture, left   . Myocardial infarction ([place])   . Paronychia   . Pneumonia   . Psoriasis   . PUD (peptic ulcer disease)   . Schatzki's ring   . Tubular adenoma of colon   . Vitiligo     Review of systems:  Otherwise negative.    Physical Exam  Gen: Alert, oriented. Appears stated age.  HEENT: /AT. PERRLA. Lungs: CTA, no wheezes. CV: RR nl S1, S2. Abd: soft, benign, no masses. BS+ Ext: No edema. Pulses 2+    Planned procedures: Proceed with EGD and colonoscopy. The patient  understands the nature of the planned procedure, indications, risks, alternatives and potential complications including but not limited to bleeding, infection, perforation, damage to internal organs and possible oversedation/side effects from anesthesia. The patient agrees and gives consent to proceed.  Please refer to procedure notes for findings, recommendations and patient disposition/instructions.     [name] K. [name], M.D. Gastroenterology 07/23/2020  1:32 PM

## 2020-07-23 NOTE — Telephone Encounter (Signed)
Alfonzo Feller [phone] , press [phone] ask for [place].   Fax number [phone]   Nevin Bloodgood called in to fu , she stated the hosp Is needing all I info that about the pt Defibrillator , all the info that is on his card.  Pt does not have it .

## 2020-07-23 NOTE — Anesthesia Procedure Notes (Signed)
Procedure Name: MAC Date/Time: 07/23/2020 2:27 PM Performed by: Lily Peer, Alizia Greif, CRNA Pre-anesthesia Checklist: Patient identified, Emergency Drugs available, Suction available, Patient being monitored and Timeout performed Patient Re-evaluated:Patient Re-evaluated prior to induction Oxygen Delivery Method: Simple face mask Induction Type: IV induction

## 2020-07-23 NOTE — Anesthesia Preprocedure Evaluation (Signed)
Anesthesia Evaluation  Patient identified by MRN, date of birth, ID band Patient awake    Reviewed: Allergy & Precautions, NPO status , Patient's Chart, lab work & pertinent test results  Airway Mallampati: II       Dental  (+) Edentulous Upper, Edentulous Lower   Pulmonary neg pulmonary ROS, former smoker,    Pulmonary exam normal breath sounds clear to auscultation       Cardiovascular hypertension, + CAD, + Past MI, + Peripheral Vascular Disease and +CHF  + Cardiac Defibrillator + Valvular Problems/Murmurs MR  Rhythm:Regular Rate:Normal + Diastolic murmurs    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 No date: CHF (congestive heart failure) ([facility]) No date: Coronary artery disease No date: External hemorrhoids 07/21/2018: Fracture of femoral neck, left ([facility]) No date: GERD (gastroesophageal reflux disease) No date: Hearing loss No date: HH (hiatus hernia) No date: Internal hemorrhoids No date: Ischemic cardiomyopathy No date: Leg fracture, left No date: Myocardial infarction ([place]) No date: Paronychia No date: Pneumonia No date: Psoriasis No date: PUD (peptic ulcer disease) No date: Schatzki's ring No date: Tubular adenoma of colon No date: Vitiligo  Reproductive/Obstetrics negative OB ROS                             Anesthesia Physical Anesthesia Plan  ASA:  IV  Anesthesia Plan: General   Post-op Pain Management:    Induction: Intravenous  PONV Risk Score and Plan: 2 and Propofol infusion  Airway Management Planned: Nasal Cannula  Additional Equipment:   Intra-op Plan:   Post-operative Plan:   Informed Consent: I have reviewed the patients History and Physical, chart, labs and discussed the procedure including the risks, benefits and alternatives for the proposed anesthesia with the patient or authorized representative who has indicated his/her understanding and acceptance.       Plan Discussed with: CRNA, Anesthesiologist and Surgeon  Anesthesia Plan Comments:         Anesthesia Quick Evaluation

## 2020-07-23 NOTE — Transfer of Care (Signed)
Immediate Anesthesia Transfer of Care Note  Patient: Carl [name]  Procedure(s) Performed: COLONOSCOPY WITH PROPOFOL (N/A )  Patient Location: PACU and Endoscopy Unit  Anesthesia Type:General  Level of Consciousness: drowsy  Airway & Oxygen Therapy: Patient Spontanous Breathing  Post-op Assessment: Report given to RN and Post -op Vital signs reviewed and stable  Post vital signs: Reviewed and stable  Last Vitals:  Vitals Value Taken Time  BP 80/51 07/23/20 1512  Temp 36.7 C 07/23/20 1508  Pulse 105 07/23/20 1514  Resp 17 07/23/20 1514  SpO2 92 % 07/23/20 1514  Vitals shown include unvalidated device data.  Last Pain:  Vitals:   07/23/20 1508  TempSrc: Temporal  PainSc: Asleep         Complications: No complications documented.

## 2020-07-23 NOTE — Progress Notes (Signed)
EPIC Encounter for ICM Monitoring  Patient Name: Carl [name] is a 69 y.o. male Date: 07/23/2020 Primary Care Physican: [name], MD Primary Cardiologist:[place] Electrophysiologist: Allred 10/26/2021OfficeWeight: 169lbs  Time in AF0.0hr/day (0.0%)  Spoke with [name], EMT paramedicine program.  She reports patient is currently an outpatient today having colonoscopy.  She reports patient was hospitalized x 1 week followed by 2 weeks in [facility].  He was discharged from [facility] on 07/21/2020.  She is unsure if patient was receiving all home meds at [facility].  She will visit patient to fill pill box on 12/2.  She reports it would be beneficial if patient has full time assistance at home since he does not always take the meds correctly and has numerous falls.  Patients brother and sister check on patient weekly.  Patient scheduled for cataract surgery 12/5  OptivolThoracic impedancesuggesting possible fluid accumulation since 05/21/2020.  Prescribed:  Torsemide20mg take2 tablets(40mg  total)by mouthdaily.  Metolazone 2.5 mgTake 1 tablet (2.5 mg total) by mouth once a week (taking on Wednesdays). Take 20 mEq Potassium with Metolazone.  Potassium 20 mEqTake 1 tablet (20 mEq total) by mouth daily.  Also take extra 20 mEq on Metolazone days.  Labs: 06/14/2020 Creatinine 2.93, BUN 49, Potassium 3.8, Sodium 128, GFR 23 05/24/2020 Creatinine2.65, BUN60, Potassium3.4, [id number], [id number] 05/21/2020 Creatinine3.62, BUN92, Potassium4.4, [id number], [id number] 04/26/2020 Creatinine 2.60, BUN 51, Potassium 4.0, Sodium 130, GFR 24-28 04/20/2020 Creatinine 2.75, BUN 43, Potassium 3.8, Sodium 129, GFR 23-26 01/27/2020 Creatinine 2.2, BUN 32, Potassium 4.0, Sodium 134, GFR 30 [facility]) 01/12/2020 Creatinine2.00, BUN64, Potassium3.6, Sodium129, [id number] 12/21/2019 Creatinine2.04, BUN31, Potassium4.6,  Sodium131, [id number]  12/19/2019 Creatinine2.04, BUN35, Potassium4.2, [id number], [id number] 11/30/2019 Creatinine 1.87, BUN 38, Potassium 4.6, Sodium 131, GFR 36-42 A complete set of results can be found in Results Review  Recommendations:Difficult to assess the effectiveness of meds since patient had inpt hospitalization/rehab facility and colonoscopy today.  Will recheck next week for more accurate reading after EMT has filled pill boxes. Also patient unable to follow low salt diet during inpatient stays and prepping for colonoscopy.  Follow-up plan: ICM clinic phone appointment on12/2/2021to recheck fluid levels. 91 day device clinic remote transmission12/09/2019.  EP/Cardiology Office Visits: 08/02/2020 with[facility]  Copy of ICM check sent to Dr.Allred.   3 month ICM trend: 07/23/2020    1 Year ICM trend:       Rosalene Billings, RN 07/23/2020 2:23 PM

## 2020-07-23 NOTE — Op Note (Signed)
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 [name] MD, MD Referring MD:          [name], MD (Referring MD) Medicines:             Propofol per Anesthesia Complications:         No immediate complications. Estimated blood loss: None. Procedure:             Pre-Anesthesia Assessment:                        - The risks and benefits of the procedure and the                         sedation options and risks were discussed with the                         patient. All questions were answered and informed                         consent was obtained.                        - Patient identification and proposed procedure were                         verified prior to the procedure by the nurse. The                         procedure was verified in the procedure room.                        - ASA Grade Assessment: III - A patient with severe                         systemic disease.                        - After reviewing the risks and benefits, the patient                         was deemed in satisfactory condition to undergo the                         procedure.                        After obtaining informed consent, the colonoscope was  passed under direct vision. Throughout the procedure,                         the patient's blood pressure, pulse, and oxygen                         saturations were monitored continuously. The                         Colonoscope was introduced through the anus and                          advanced to the the cecum, identified by appendiceal                         orifice and ileocecal valve. The colonoscopy was                         technically difficult and complex due to poor                         endoscopic visualization. Successful completion of the                         procedure was aided by copious irrigation. The patient                         tolerated the procedure well. The quality of the bowel                         preparation was not adequate to identify polyps 6 mm                         and larger in size. The ileocecal valve, appendiceal                         orifice, and rectum were photographed. Findings:      The perianal and digital rectal examinations were normal. Pertinent       negatives include normal sphincter tone and no palpable rectal lesions.      Non-bleeding internal hemorrhoids were found during retroflexion. The       hemorrhoids were Grade I (internal hemorrhoids that do not prolapse).      Many small and large-mouthed diverticula were found in the entire colon.      A 5 mm polyp was found in the cecum. The polyp was sessile. The polyp       was removed with a jumbo cold forceps. Resection and retrieval were       complete.      A 8 mm polyp was found in the transverse colon. The polyp was sessile.       The polyp was removed with a hot snare. Polyp resection was incomplete,       and the resected tissue was not retrieved.      Copious quantities of semi-liquid stool was found in the transverse       colon, in the ascending colon and in the cecum, interfering with       visualization. Lavage of the area was performed using copious amounts  of       sterile water, resulting in incomplete clearance with continued poor       visualization.      The exam was otherwise without abnormality.      There is no endoscopic evidence of bleeding, erythema, inflammation,       mass, polyps or angioectasia in the entire  colon. Impression:            - Preparation of the colon was inadequate.                        - Non-bleeding internal hemorrhoids.                        - Diverticulosis in the entire examined colon.                        - One 5 mm polyp in the cecum, removed with a jumbo                         cold forceps. Resected and retrieved.                        - One 8 mm polyp in the transverse colon, removed with                         a hot snare. Incomplete resection. Resected tissue not                         retrieved.                        - Stool in the transverse colon, in the ascending                         colon and in the cecum.                        - The examination was otherwise normal. Recommendation:        - Patient has a contact number available for                         emergencies. The signs and symptoms of potential                         delayed complications were discussed with the patient.                         Return to normal activities tomorrow. Written                         discharge instructions were provided to the patient.                        - Resume previous diet.                        - Continue present medications.                        -  Await pathology results.                        - Repeat colonoscopy in 1 year for surveillance.                        - Return to GI office in 3 months.                        - The findings and recommendations were discussed with                         the patient.                        - The findings and recommendations were discussed with                         the patient. Procedure Code(s):     --- Professional ---                        [phone], Colonoscopy, flexible; with removal of                         tumor(s), polyp(s), or other lesion(s) by snare                         technique                        45380, 70, Colonoscopy, flexible; with biopsy, single                         or  multiple Diagnosis Code(s):     --- Professional ---                        K57.30, Diverticulosis of large intestine without                         perforation or abscess without bleeding                        D50.0, Iron deficiency anemia secondary to blood loss                         (chronic)                        K55.20, Angiodysplasia of colon without hemorrhage                        K63.5, Polyp of colon                        K64.0, First degree hemorrhoids CPT copyright 2019 American Medical Association. All rights reserved. The codes documented in this report are preliminary and upon coder review may  be revised to meet current compliance requirements. Efrain Sella MD, MD 07/23/2020 3:07:16 PM This report has been signed electronically. Number of Addenda: 0 Note Initiated On: 07/23/2020 2:24 PM Scope Withdrawal Time: 0 hours 12 minutes 13 seconds  Total Procedure Duration: 0 hours 23 minutes 0 seconds  Estimated Blood Loss:  Estimated blood loss was minimal. Estimated blood                         loss: none.      [facility]

## 2020-07-24 ENCOUNTER — Encounter: Payer: Self-pay | Admitting: Internal Medicine

## 2020-07-24 ENCOUNTER — Other Ambulatory Visit
Admit: 2020-07-24 | Discharge: 2020-07-24 | Disposition: A | Discharge status: Home / Self Care | Payer: Medicare Other | Attending: Ophthalmology | Admitting: Ophthalmology

## 2020-07-24 NOTE — Pre-Procedure Instructions (Signed)
Call Caryl Asp and was unable to leave a message. Called Kaleen Odea and left a message to inform them that the legal guardian must be present to sign the consent for surgery.

## 2020-07-24 NOTE — Anesthesia Postprocedure Evaluation (Signed)
Anesthesia Post Note  Patient: Carl [name]  Procedure(s) Performed: COLONOSCOPY WITH PROPOFOL (N/A )  Patient location during evaluation: Endoscopy Anesthesia Type: General Level of consciousness: awake Pain management: pain level controlled Vital Signs Assessment: post-procedure vital signs reviewed and stable Respiratory status: spontaneous breathing Cardiovascular status: stable Postop Assessment: no apparent nausea or vomiting Anesthetic complications: no   No complications documented.   Last Vitals:  Vitals:   07/23/20 1510 07/23/20 1528  BP: (!) 80/51 (!) 85/54  Pulse:    Resp:    Temp:    SpO2:      Last Pain:  Vitals:   07/23/20 1538  TempSrc:   PainSc: 0-No pain                 Neva Seat

## 2020-07-25 ENCOUNTER — Encounter (HOSPITAL_COMMUNITY): Payer: Self-pay

## 2020-07-25 ENCOUNTER — Other Ambulatory Visit (HOSPITAL_COMMUNITY): Payer: Self-pay

## 2020-07-25 LAB — SURGICAL PATHOLOGY

## 2020-07-25 MED ORDER — MOXIFLOXACIN HCL 0.5 % OP SOLN: 1.0000 [drp] | OPHTHALMIC | Status: DC | PRN

## 2020-07-25 MED ORDER — SODIUM CHLORIDE 0.9 % IV SOLN: INTRAVENOUS | Status: DC

## 2020-07-25 MED ORDER — TETRACAINE HCL 0.5 % OP SOLN
1.0000 [drp] | OPHTHALMIC | Status: AC | PRN
  Administered 2020-07-26 (×2): 1 [drp] via OPHTHALMIC

## 2020-07-25 MED ORDER — ARMC OPHTHALMIC DILATING DROPS
1.0000 "application " | OPHTHALMIC | Status: AC | PRN
  Administered 2020-07-26 (×3): 1 via OPHTHALMIC

## 2020-07-25 NOTE — Progress Notes (Signed)
Today had a home appt with Carl [name].  He is having Cataract surgery tomorrow.  Switched his metolazone to Wednesday, he took it while I was there.  Received a call from [name] stating that he was full of fluid.  Advised her that he had been at [facility] past 2  Weeks and unsure of how they were giving his medications.  She states she will read again next week and to make sure he is taking his medications.  His sister and brother are coming in the morning and at night and handing him his medications now.  Also making him food.  He is drinking about 60 to 70 ounces fluid a day.  They are watching his sodium in foods.  Zaahir is very hard to understand, not what he keeps talking about today.  Could understand him some but not other times.  His sister says he has been like that.  Advised his brother [name] and he states been like that since hospital stay.  Advised him may need to get appt with Dr Ginette Pitman just for check up.  Elior did get up and ambulate with walker.  He appears to be moving ok with walker.  He has all his medications and filled his med boxes up.  Will continue to visit for heart failure, diet and medication compliance.   [place] [phone]

## 2020-07-26 ENCOUNTER — Encounter: Payer: Self-pay | Admitting: Ophthalmology

## 2020-07-26 ENCOUNTER — Ambulatory Visit (INDEPENDENT_AMBULATORY_CARE_PROVIDER_SITE_OTHER): Payer: Medicare Other

## 2020-07-26 ENCOUNTER — Ambulatory Visit
Admission: RE | Admit: 2020-07-26 | Discharge: 2020-07-26 | Disposition: A | Discharge status: Home / Self Care | Payer: Medicare Other | Attending: Ophthalmology | Admitting: Ophthalmology

## 2020-07-26 ENCOUNTER — Ambulatory Visit: Payer: Medicare Other | Admitting: Certified Registered Nurse Anesthetist

## 2020-07-26 ENCOUNTER — Encounter
Admission: RE | Disposition: A | Discharge status: Home / Self Care | Payer: Self-pay | Source: Home / Self Care | Attending: Ophthalmology

## 2020-07-26 DIAGNOSIS — Z95810 Presence of automatic (implantable) cardiac defibrillator: Secondary | ICD-10-CM

## 2020-07-26 DIAGNOSIS — H2511 Age-related nuclear cataract, right eye: Secondary | ICD-10-CM | POA: Diagnosis present

## 2020-07-26 DIAGNOSIS — Z87891 Personal history of nicotine dependence: Secondary | ICD-10-CM | POA: Diagnosis not present

## 2020-07-26 DIAGNOSIS — I255 Ischemic cardiomyopathy: Secondary | ICD-10-CM | POA: Diagnosis not present

## 2020-07-26 DIAGNOSIS — Z79899 Other long term (current) drug therapy: Secondary | ICD-10-CM | POA: Insufficient documentation

## 2020-07-26 DIAGNOSIS — I5022 Chronic systolic (congestive) heart failure: Secondary | ICD-10-CM

## 2020-07-26 HISTORY — DX: Hyperlipidemia, unspecified: E78.5

## 2020-07-26 HISTORY — DX: Hypokalemia: E87.6

## 2020-07-26 HISTORY — DX: Unspecified intellectual disabilities: F79

## 2020-07-26 HISTORY — DX: Chronic kidney disease, unspecified: N18.9

## 2020-07-26 HISTORY — DX: Cardiac arrhythmia, unspecified: I49.9

## 2020-07-26 HISTORY — DX: Peripheral vascular disease, unspecified: I73.9

## 2020-07-26 HISTORY — DX: Gout, unspecified: M10.9

## 2020-07-26 HISTORY — PX: CATARACT EXTRACTION W/PHACO: SHX586

## 2020-07-26 HISTORY — DX: Cardiac murmur, unspecified: R01.1

## 2020-07-26 HISTORY — DX: Depression, unspecified: F32.A

## 2020-07-26 HISTORY — DX: Hypo-osmolality and hyponatremia: E87.1

## 2020-07-26 HISTORY — DX: Unspecified cirrhosis of liver: K74.60

## 2020-07-26 HISTORY — DX: Age-related osteoporosis without current pathological fracture: M81.0

## 2020-07-26 HISTORY — DX: Hepatic failure, unspecified without coma: K72.90

## 2020-07-26 SURGERY — PHACOEMULSIFICATION, CATARACT, WITH IOL INSERTION
Anesthesia: Monitor Anesthesia Care | Site: Eye | Laterality: Right

## 2020-07-26 MED ORDER — EPINEPHRINE PF 1 MG/ML IJ SOLN: INTRAOCULAR | Status: DC | PRN

## 2020-07-26 MED ORDER — ONDANSETRON HCL 4 MG/2ML IJ SOLN
INTRAMUSCULAR | Status: DC | PRN
  Administered 2020-07-26: 4 mg via INTRAVENOUS

## 2020-07-26 MED ORDER — CARBACHOL 0.01 % IO SOLN
INTRAOCULAR | Status: DC | PRN
  Administered 2020-07-26: 0.5 mL via INTRAOCULAR

## 2020-07-26 MED ORDER — POVIDONE-IODINE 5 % OP SOLN
OPHTHALMIC | Status: DC | PRN
  Administered 2020-07-26: 1 via OPHTHALMIC

## 2020-07-26 MED ORDER — NA CHONDROIT SULF-NA HYALURON 40-17 MG/ML IO SOLN
INTRAOCULAR | Status: DC | PRN
  Administered 2020-07-26: 1 mL via INTRAOCULAR

## 2020-07-26 MED ORDER — FENTANYL CITRATE (PF) 100 MCG/2ML IJ SOLN
INTRAMUSCULAR | Status: AC
  Filled 2020-07-26: qty 2

## 2020-07-26 MED ORDER — MIDAZOLAM HCL 2 MG/2ML IJ SOLN
INTRAMUSCULAR | Status: AC
  Filled 2020-07-26: qty 2

## 2020-07-26 MED ORDER — DEXMEDETOMIDINE HCL 200 MCG/2ML IV SOLN
INTRAVENOUS | Status: DC | PRN
  Administered 2020-07-26: 4 ug via INTRAVENOUS
  Administered 2020-07-26: 8 ug via INTRAVENOUS
  Administered 2020-07-26 (×2): 4 ug via INTRAVENOUS

## 2020-07-26 MED ORDER — DEXMEDETOMIDINE (PRECEDEX) IN NS 20 MCG/5ML (4 MCG/ML) IV SYRINGE
PREFILLED_SYRINGE | INTRAVENOUS | Status: AC
  Filled 2020-07-26: qty 5

## 2020-07-26 MED ORDER — MOXIFLOXACIN HCL 0.5 % OP SOLN
OPHTHALMIC | Status: DC | PRN
  Administered 2020-07-26: 0.2 mL via OPHTHALMIC

## 2020-07-26 MED ORDER — LIDOCAINE HCL (PF) 4 % IJ SOLN
INTRAOCULAR | Status: DC | PRN
  Administered 2020-07-26: 4 mL via OPHTHALMIC

## 2020-07-26 MED ORDER — TRYPAN BLUE 0.06 % OP SOLN
OPHTHALMIC | Status: AC
  Filled 2020-07-26: qty 0.5

## 2020-07-26 MED ORDER — TRYPAN BLUE 0.06 % OP SOLN
OPHTHALMIC | Status: DC | PRN
  Administered 2020-07-26: 0.5 mL via INTRAOCULAR

## 2020-07-26 SURGICAL SUPPLY — 17 items
GLOVE BIO SURGEON STRL SZ8 (GLOVE) ×3 IMPLANT
GLOVE BIOGEL M 6.5 STRL (GLOVE) ×3 IMPLANT
GLOVE SURG LX 8.0 MICRO (GLOVE) ×2
GLOVE SURG LX STRL 8.0 MICRO (GLOVE) ×1 IMPLANT
GOWN STRL REUS W/ TWL LRG LVL3 (GOWN DISPOSABLE) ×2 IMPLANT
GOWN STRL REUS W/TWL LRG LVL3 (GOWN DISPOSABLE) ×6
LABEL CATARACT MEDS ST (LABEL) ×3 IMPLANT
LENS IOL TECNIS EYHANCE 20.5 (Intraocular Lens) ×3 IMPLANT
MANIFOLD NEPTUNE II (INSTRUMENTS) ×3 IMPLANT
PACK CATARACT (MISCELLANEOUS) ×3 IMPLANT
PACK CATARACT BRASINGTON LX (MISCELLANEOUS) ×3 IMPLANT
PACK EYE AFTER SURG (MISCELLANEOUS) ×3 IMPLANT
SOL BSS BAG (MISCELLANEOUS) ×3
SOLUTION BSS BAG (MISCELLANEOUS) ×1 IMPLANT
SYR 5ML LL (SYRINGE) ×3 IMPLANT
WATER STERILE IRR 250ML POUR (IV SOLUTION) ×3 IMPLANT
WIPE NON LINTING 3.25X3.25 (MISCELLANEOUS) ×3 IMPLANT

## 2020-07-26 NOTE — Progress Notes (Signed)
   07/26/20 0740  Clinical Encounter Type  Visited With Patient;Family  Visit Type Initial  Referral From Chaplain  Consult/Referral To Chaplain  While rounding SDS waiting area, chaplain spoke to Pt's brother and he told chaplain that he received a page to go back to room 21. Chaplain went in the back to find out where he should go and she came back and escorted Carl [name] to where Pt was.

## 2020-07-26 NOTE — Anesthesia Preprocedure Evaluation (Addendum)
Anesthesia Evaluation  Patient identified by MRN, date of birth, ID band Patient awake    Reviewed: Allergy & Precautions, NPO status , Patient's Chart, lab work & pertinent test results  Airway Mallampati: II       Dental  (+) Edentulous Upper, Edentulous Lower   Pulmonary neg pulmonary ROS, former smoker,    Pulmonary exam normal breath sounds clear to auscultation       Cardiovascular hypertension, + CAD, + Past MI, + Peripheral Vascular Disease and +CHF  + dysrhythmias + Cardiac Defibrillator + Valvular Problems/Murmurs MR  Rhythm:Regular Rate:Normal + Diastolic murmurs    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 No date: CHF (congestive heart failure) (HCC) No date: Coronary artery disease No date: External hemorrhoids 07/21/2018: Fracture of femoral neck, left ([facility]) No date: GERD (gastroesophageal reflux disease) No date: Hearing loss No date: HH (hiatus hernia) No date: Internal hemorrhoids No date: Ischemic cardiomyopathy No date: Leg fracture, left No date: Myocardial infarction ([place]) No date: Paronychia No date: Pneumonia No date: Psoriasis No date: PUD (peptic ulcer disease) No date: Schatzki's ring No date: Tubular adenoma of colon No date: Vitiligo  Reproductive/Obstetrics negative OB ROS                             Anesthesia Physical  Anesthesia  Plan  ASA: IV  Anesthesia Plan: MAC   Post-op Pain Management:    Induction: Intravenous  PONV Risk Score and Plan: 2 and Propofol infusion  Airway Management Planned: Nasal Cannula  Additional Equipment:   Intra-op Plan:   Post-operative Plan:   Informed Consent: I have reviewed the patients History and Physical, chart, labs and discussed the procedure including the risks, benefits and alternatives for the proposed anesthesia with the patient or authorized representative who has indicated his/her understanding and acceptance.       Plan Discussed with: CRNA, Anesthesiologist and Surgeon  Anesthesia Plan Comments:         Anesthesia Quick Evaluation

## 2020-07-26 NOTE — Transfer of Care (Signed)
Immediate Anesthesia Transfer of Care Note  Patient: Carl [name]  Procedure(s) Performed: CATARACT EXTRACTION PHACO AND INTRAOCULAR LENS PLACEMENT (IOC) RIGHT VISION BLUE (Right Eye)  Patient Location: PACU  Anesthesia Type:MAC  Level of Consciousness: awake, alert  and oriented  Airway & Oxygen Therapy: Patient Spontanous Breathing  Post-op Assessment: Report given to RN and Post -op Vital signs reviewed and stable  Post vital signs: Reviewed and stable  Last Vitals:  Vitals Value Taken Time  BP    Temp    Pulse    Resp    SpO2      Last Pain:  Vitals:   07/26/20 0810  PainSc: 0-No pain         Complications: No complications documented.

## 2020-07-26 NOTE — H&P (Signed)
[place]   Primary Care Physician:  [name], MD Ophthalmologist: Dr. George Ina  Pre-Procedure History & Physical: HPI:  Carl [name] is a 69 y.o. male here for cataract surgery.   Past Medical History:  Diagnosis Date  . AICD (automatic cardioverter/defibrillator) present   . Anemia   . Anxiety   . Arthritis   . Brunner's gland hyperplasia of duodenum   . CHF (congestive heart failure) ([place])   . Chronic kidney disease   . Cirrhosis of liver ([place])   . Coronary artery disease   . Depression   . Dysrhythmia    atrial fib  . External hemorrhoids   . Fracture of femoral neck, left ([place]) 07/21/2018  . GERD (gastroesophageal reflux disease)   . Gout   . Hearing loss   . Heart murmur    mirtal valve insufficiency  . Hepatic failure ([place])   . HH (hiatus hernia)   . Hyperlipidemia   . Hypokalemia   . Hyposmolality   . Intellectual disability    mild  . Internal hemorrhoids   . Ischemic cardiomyopathy   . Leg fracture, left   . Myocardial infarction ([place])   . Osteoporosis   . Paronychia   . Peripheral vascular disease ([place])   . Pneumonia   . Psoriasis   . Psoriasis   . PUD (peptic ulcer disease)   . Schatzki's ring   . Tubular adenoma of colon   . Vitiligo     Past Surgical History:  Procedure Laterality Date  . ATRIAL SEPTAL DEFECT(ASD) CLOSURE N/A 12/30/2018   Procedure: ATRIAL SEPTAL DEFECT(ASD) CLOSURE;  Surgeon: [name], MD;  Location: [place] [facility];  Service: Cardiovascular;  Laterality: N/A;  . CARDIAC SURGERY     ICD  . CARDIOVERSION N/A 11/09/2019   Procedure: CARDIOVERSION;  Surgeon: [name], MD;  Location: [facility] ENDOSCOPY;  Service: Cardiovascular;  Laterality: N/A;  . CARDIOVERSION N/A 12/21/2019   Procedure: CARDIOVERSION;  Surgeon: [name], MD;  Location: [facility] ENDOSCOPY;  Service: Cardiovascular;  Laterality: N/A;  . COLONOSCOPY WITH ESOPHAGOGASTRODUODENOSCOPY (EGD)    . COLONOSCOPY WITH PROPOFOL N/A  08/24/2017   Procedure: COLONOSCOPY WITH PROPOFOL;  Surgeon: [name], MD;  Location: [facility] ENDOSCOPY;  Service: Endoscopy;  Laterality: N/A;  . COLONOSCOPY WITH PROPOFOL N/A 07/23/2020   Procedure: COLONOSCOPY WITH PROPOFOL;  Surgeon: Toledo, [name], MD;  Location: [facility] ENDOSCOPY;  Service: Gastroenterology;  Laterality: N/A;  . CORONARY ARTERY BYPASS GRAFT N/A 08/03/2014   Procedure: CORONARY ARTERY BYPASS GRAFTING (CABG);  Surgeon: [name], MD;  Location: [place];  Service: Open Heart Surgery;  Laterality: N/A;  . EP IMPLANTABLE DEVICE N/A 05/01/2015   MDT ICD implanted for primary prevention of sudden death  . ESOPHAGOGASTRODUODENOSCOPY N/A 06/28/2020   Procedure: ESOPHAGOGASTRODUODENOSCOPY (EGD);  Surgeon: Clarene Essex, MD;  Location: [place];  Service: Endoscopy;  Laterality: N/A;  . ESOPHAGOGASTRODUODENOSCOPY (EGD) WITH PROPOFOL N/A 04/16/2015   Procedure: ESOPHAGOGASTRODUODENOSCOPY (EGD) WITH PROPOFOL;  Surgeon: [name], MD;  Location: [facility] ENDOSCOPY;  Service: Endoscopy;  Laterality: N/A;  . ESOPHAGOGASTRODUODENOSCOPY (EGD) WITH PROPOFOL N/A 08/24/2017   Procedure: ESOPHAGOGASTRODUODENOSCOPY (EGD) WITH PROPOFOL;  Surgeon: [name], MD;  Location: [facility] ENDOSCOPY;  Service: Endoscopy;  Laterality: N/A;  . FRACTURE SURGERY Left    ORIF lower leg  . HERNIA REPAIR    . HIP PINNING,CANNULATED Left 07/22/2018   Procedure: CANNULATED HIP PINNING;  Surgeon: [name], MD;  Location: [place];  Service: Orthopedics;  Laterality: Left;  . MITRAL VALVE REPAIR N/A 12/30/2018   Procedure: MITRAL VALVE REPAIR;  Surgeon: [name], MD;  Location: [place] [facility];  Service: Cardiovascular;  Laterality: N/A;  . RIGHT/LEFT HEART CATH AND CORONARY/GRAFT ANGIOGRAPHY N/A 12/21/2018   Procedure: RIGHT/LEFT HEART CATH AND CORONARY/GRAFT ANGIOGRAPHY;  Surgeon: [name], MD;  Location: [place] [facility];  Service: Cardiovascular;  Laterality: N/A;  . TEE  WITHOUT CARDIOVERSION N/A 08/03/2014   Procedure: TRANSESOPHAGEAL ECHOCARDIOGRAM (TEE);  Surgeon: [name], MD;  Location: [place];  Service: Open Heart Surgery;  Laterality: N/A;  . TEE WITHOUT CARDIOVERSION N/A 02/10/2018   Procedure: TRANSESOPHAGEAL ECHOCARDIOGRAM (TEE);  Surgeon: [name], MD;  Location: [facility] ENDOSCOPY;  Service: Cardiovascular;  Laterality: N/A;  . TEE WITHOUT CARDIOVERSION N/A 11/09/2019   Procedure: TRANSESOPHAGEAL ECHOCARDIOGRAM (TEE);  Surgeon: [name], MD;  Location: [facility] ENDOSCOPY;  Service: Cardiovascular;  Laterality: N/A;    Prior to Admission medications   Medication Sig Start Date End Date Taking? Authorizing Provider  acetaminophen (TYLENOL) 500 MG tablet Take 500-1,000 mg by mouth every 6 (six) hours as needed (for pain.).   Yes [provider]  allopurinol (ZYLOPRIM) 100 MG tablet Take 100 mg by mouth daily.  02/08/19  Yes [provider]  amiodarone (PACERONE) 200 MG tablet Take 1 tablet (200 mg total) by mouth daily. 04/13/20  Yes Bensimhon, Shaune Pascal, MD  Ascorbic Acid (VITAMIN C) 100 MG tablet Take 100 mg by mouth daily.   Yes [provider]  atorvastatin (LIPITOR) 40 MG tablet Take 40 mg by mouth every evening.   Yes [provider]  carvedilol (COREG) 3.125 MG tablet Take 1 tablet (3.125 mg total) by mouth 2 (two) times daily. 05/21/20  Yes Lyda Jester M, PA-C  Cholecalciferol (VITAMIN D3 SUPER STRENGTH) 50 MCG (2000 UT) TABS Take 2,000 Units by mouth at bedtime.    Yes [provider]  cyanocobalamin 500 MCG tablet Take 500 mcg by mouth daily.    Yes [provider]  docusate sodium (COLACE) 100 MG capsule Take 100 mg by mouth daily as needed for mild constipation.   Yes [provider]  DULoxetine (CYMBALTA) 30 MG capsule Take 30 mg by mouth at bedtime.    Yes [provider]  ferrous sulfate 325 (65 FE) MG tablet Take 1 tablet (325 mg total) by mouth daily with  breakfast. 02/09/18  Yes [name], MD  gabapentin (NEURONTIN) 300 MG capsule Take 300 mg by mouth at bedtime.    Yes [provider]  ketoconazole (NIZORAL) 2 % shampoo Apply 1 application topically 2 (two) times a week. Mon and Thu 09/28/18  Yes [provider]  lactulose (CHRONULAC) 10 GM/15ML solution Take 30 mLs (20 g total) by mouth daily. 07/05/20  Yes Gaylan Gerold, DO  metolazone (ZAROXOLYN) 2.5 MG tablet Take 1 tab every Thursday. Take 20 mEq Potassium with Metolazone. Patient taking differently: Take 2.5 mg by mouth every Wednesday. He is taking on Wednesday 07/12/20  Yes Clegg, Amy D, NP  Multiple Vitamin (MULTIVITAMIN) tablet Take 1 tablet by mouth at bedtime.    Yes [provider]  pantoprazole (PROTONIX) 40 MG tablet TAKE 1 TABLET BY MOUTH EVERY DAY Patient taking differently: Take 40 mg by mouth daily.  12/08/19  Yes [name], MD  potassium chloride SA (KLOR-CON M20) 20 MEQ tablet Take 1 tablet (20 mEq total) by mouth daily. Patient taking differently: Take 20 mEq by mouth daily.  He is taking an extra 20 meq with metolazone on Wednesday 06/06/20  Yes [name], MD  sucralfate (CARAFATE) 1 g tablet Take 1 tablet (1 g total) by mouth 2 (two) times daily. 07/05/20  Yes Gaylan Gerold, DO  torsemide (DEMADEX) 20 MG tablet Take 4 tablets (80 mg total) by mouth daily. 07/12/20  Yes Clegg, Amy D, NP    Allergies as of 06/21/2020 - Review Complete 06/07/2020  Allergen Reaction Noted  . Spironolactone Other (See Comments) 02/20/2017    Family History  Problem Relation Age of Onset  . Valvular heart disease Mother        Ruptured valve  . CAD Father   . Heart Problems Brother        Stents x 4  . Diabetes Brother   . Prostate cancer Neg Hx   . Bladder Cancer Neg Hx   . Kidney cancer Neg Hx     Social History   Socioeconomic History  . Marital status: Single    Spouse name: Not on file  . Number of children: Not on file  . Years  of education: Not on file  . Highest education level: Not on file  Occupational History  . Not on file  Tobacco Use  . Smoking status: Former Smoker    Quit date: 2014    Years since quitting: 7.9  . Smokeless tobacco: Never Used  . Tobacco comment: 07/30/2017 Quit in 2014  Vaping Use  . Vaping Use: Never used  Substance and Sexual Activity  . Alcohol use: No    Alcohol/week: 0.0 standard drinks  . Drug use: No  . Sexual activity: Never  Other Topics Concern  . Not on file  Social History Narrative   Pt lives in [place] with his mother.   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 Difficulty of Paying Living Expenses: Not on file  Food Insecurity:   . Worried About Charity fundraiser in the Last Year: Not on file  . Ran Out of Food in the Last Year: Not on file  Transportation Needs:   . Lack of Transportation (Medical): Not on file  . Lack of Transportation (Non-Medical): Not on file  Physical Activity:   . Days of Exercise per Week: Not on file  . Minutes of Exercise per Session: Not on file  Stress:   . Feeling of Stress : Not on file  Social Connections:   . Frequency of Communication with Friends and Family: Not on file  . Frequency of Social Gatherings with Friends and Family: Not on file  . Attends Religious Services: Not on file  . Active Member of Clubs or Organizations: Not on file  . Attends Archivist Meetings: Not on file  . Marital Status: Not on file  Intimate Partner Violence:   . Fear of Current or Ex-Partner: Not on file  . Emotionally Abused: Not on file  . Physically Abused: Not on file  . Sexually Abused: Not on file    Review of Systems: See HPI, otherwise negative ROS  Physical Exam: BP (!) 89/70   Pulse (!) 52   Resp 18   SpO2 100%  General:   Alert,  pleasant and cooperative in NAD Head:  Normocephalic and atraumatic. Respiratory:  Normal work of  breathing. Heart:  Regular rate and rhythm.   Impression/Plan: [name] is here for cataract surgery.  Risks, benefits,  limitations, and alternatives regarding cataract surgery have been reviewed with the patient.  Questions have been answered.  All parties agreeable.   Birder Robson, MD  07/26/2020, 8:26 AM

## 2020-07-26 NOTE — Discharge Instructions (Signed)

## 2020-07-26 NOTE — Op Note (Signed)
PREOPERATIVE DIAGNOSIS:  Nuclear sclerotic cataract of the right eye.   POSTOPERATIVE DIAGNOSIS:  Nuclear Sclerotic Cataract Right Eye   OPERATIVE PROCEDURE: Procedure(s): CATARACT EXTRACTION PHACO AND INTRAOCULAR LENS PLACEMENT ([place]) RIGHT VISION BLUE   SURGEON:  Birder Robson, MD.   ANESTHESIA:  Anesthesiologist: Alvin Critchley, MD CRNA: Tollie Eth, CRNA  1.      Managed anesthesia care. 2.      0.19ml of Shugarcaine was instilled in the eye following the paracentesis.   COMPLICATIONS: Vision Blue was used to stain the anterior capsule due to very poor/ no visualization of the red reflex.    TECHNIQUE:   Stop and chop   DESCRIPTION OF PROCEDURE:  The patient was examined and consented in the preoperative holding area where the aforementioned topical anesthesia was applied to the right eye and then brought back to the Operating Room where the right eye was prepped and draped in the usual sterile ophthalmic fashion and a lid speculum was placed. A paracentesis was created with the side port blade and the anterior chamber was filled with viscoelastic. A near clear corneal incision was performed with the steel keratome. A continuous curvilinear capsulorrhexis was performed with a cystotome followed by the capsulorrhexis forceps. Hydrodissection and hydrodelineation were carried out with BSS on a blunt cannula. The lens was removed in a stop and chop  technique and the remaining cortical material was removed with the irrigation-aspiration handpiece. The capsular bag was inflated with viscoelastic and the Technis ZCB00  lens was placed in the capsular bag without complication. The remaining viscoelastic was removed from the eye with the irrigation-aspiration handpiece. The wounds were hydrated. The anterior chamber was flushed with Miostat and the eye was inflated to physiologic pressure. 0.66ml of Vigamox was placed in the anterior chamber. The wounds were found to be water tight. The eye  was dressed with Vigamox. The patient was given protective glasses to wear throughout the day and a shield with which to sleep tonight. The patient was also given drops with which to begin a drop regimen today and will follow-up with me in one day. Implant Name Type Inv. Item Serial No. Manufacturer Lot No. LRB No. Used Action  LENS IOL TECNIS EYHANCE 20.5 - [id number] Intraocular Lens LENS IOL TECNIS EYHANCE 20.5 [id number] JOHNSON   Right 1 Implanted   Procedure(s) with comments: CATARACT EXTRACTION PHACO AND INTRAOCULAR LENS PLACEMENT (IOC) RIGHT VISION BLUE (Right) - [place] 01:14.1 CDE 13.65 Fluid Pack Lot # [id number]  Electronically signed: Birder Robson 07/26/2020 8:57 AM

## 2020-07-27 ENCOUNTER — Other Ambulatory Visit: Payer: Self-pay

## 2020-07-27 ENCOUNTER — Inpatient Hospital Stay: Payer: No Typology Code available for payment source | Attending: Oncology

## 2020-07-27 DIAGNOSIS — D509 Iron deficiency anemia, unspecified: Secondary | ICD-10-CM

## 2020-07-27 DIAGNOSIS — N1831 Chronic kidney disease, stage 3a: Secondary | ICD-10-CM | POA: Insufficient documentation

## 2020-07-27 DIAGNOSIS — D631 Anemia in chronic kidney disease: Secondary | ICD-10-CM | POA: Insufficient documentation

## 2020-07-27 LAB — CUP PACEART REMOTE DEVICE CHECK
Battery Remaining Longevity: 86 mo
Battery Voltage: 2.98 V
Brady Statistic RV Percent Paced: 0.12 %
Date Time Interrogation Session: 20211202224227
HighPow Impedance: 72 Ohm
Implantable Lead Implant Date: 20160906
Implantable Lead Location: 753860
Implantable Pulse Generator Implant Date: 20160906
Lead Channel Impedance Value: 323 Ohm
Lead Channel Impedance Value: 380 Ohm
Lead Channel Pacing Threshold Amplitude: 0.75 V
Lead Channel Pacing Threshold Pulse Width: 0.4 ms
Lead Channel Sensing Intrinsic Amplitude: 10.75 mV
Lead Channel Sensing Intrinsic Amplitude: 10.75 mV
Lead Channel Setting Pacing Amplitude: 2.5 V
Lead Channel Setting Pacing Pulse Width: 0.4 ms
Lead Channel Setting Sensing Sensitivity: 0.3 mV

## 2020-07-27 LAB — CBC WITH DIFFERENTIAL/PLATELET
Abs Immature Granulocytes: 0.04 10*3/uL (ref 0.00–0.07)
Basophils Absolute: 0.1 10*3/uL (ref 0.0–0.1)
Basophils Relative: 1 %
Eosinophils Absolute: 0.3 10*3/uL (ref 0.0–0.5)
Eosinophils Relative: 4 %
HCT: 25.4 % — ABNORMAL LOW (ref 39.0–52.0)
Hemoglobin: 8.3 g/dL — ABNORMAL LOW (ref 13.0–17.0)
Immature Granulocytes: 1 %
Lymphocytes Relative: 9 %
Lymphs Abs: 0.5 10*3/uL — ABNORMAL LOW (ref 0.7–4.0)
MCH: 31.1 pg (ref 26.0–34.0)
MCHC: 32.7 g/dL (ref 30.0–36.0)
MCV: 95.1 fL (ref 80.0–100.0)
Monocytes Absolute: 0.9 10*3/uL (ref 0.1–1.0)
Monocytes Relative: 16 %
Neutro Abs: 4.2 10*3/uL (ref 1.7–7.7)
Neutrophils Relative %: 69 %
Platelets: 267 10*3/uL (ref 150–400)
RBC: 2.67 MIL/uL — ABNORMAL LOW (ref 4.22–5.81)
RDW: 18.2 % — ABNORMAL HIGH (ref 11.5–15.5)
WBC: 6 10*3/uL (ref 4.0–10.5)
nRBC: 0 % (ref 0.0–0.2)

## 2020-07-27 LAB — FERRITIN: Ferritin: 19 ng/mL — ABNORMAL LOW (ref 24–336)

## 2020-07-27 LAB — IRON AND TIBC
Iron: 358 ug/dL — ABNORMAL HIGH (ref 45–182)
Saturation Ratios: 85 % — ABNORMAL HIGH (ref 17.9–39.5)
TIBC: 423 ug/dL (ref 250–450)
UIBC: 65 ug/dL

## 2020-07-27 NOTE — Progress Notes (Signed)
EPIC Encounter for ICM Monitoring  Patient Name: Carl [name] is a 69 y.o. male Date: 07/27/2020 Primary Care Physican: [name], MD Primary Cardiologist:[place] Electrophysiologist: Allred 12/1/2021Weight: 164lbs  Time in AF0.0hr/day (0.0%)  Spoke with brother and patient not experiencing any fluid symptoms.  He reports it is getting more difficulty to understand patients speech and wonders if he has has a stroke.  Reviewed stroke symptoms and he has not seen any of the other symptoms.   Patient had cataract surgery on 07/26/2020.  In November hospitalized x 1 week for anemia followed by 2 weeks in [facility].  He was discharged from [facility] on 07/21/2020   OptivolThoracic impedancesuggesting possible fluid accumulation since 05/21/2020 and remains unchanged after weekly Metolazone dosage on 07/25/20.  Prescribed:  Torsemide20mg take4 tablets(80mg  total)by mouthdaily.  Metolazone 2.5 mgTake 1 tablet (2.5 mg total) by mouth once a week(taking on Wednesdays). Take 20 mEq Potassium with Metolazone.  Potassium 20 mEqTake 1 tablet (20 mEq total)by mouth daily. Also take extra 20 mEq on Metolazone days.  Labs: 07/12/2020 Creatinine 2.26, BUN 38, Potassium 4.1, Sodium 132, GFR 31 07/03/2020 Creatinine 2.20, BUN 34, Potassium 4.7, Sodium 130, GFR 32  07/02/2020 Creatinine 2.14, BUN 35, Potassium 3.5, Sodium 130, GFR 33  06/30/2020 Creatinine 2.31, BUN 49, Potassium 3.5, Sodium 132, GFR 30  06/29/2020 Creatinine 2.67, BUN 65, Potassium 3.4, Sodium 134, GFR 25  06/28/2020 Creatinine 3.07, BUN 78, Potassium 3.3, Sodium 128, GFR 21  06/27/2020 Creatinine 3.51, BUN 89, Potassium 3.6, Sodium 127, GFR 18  06/14/2020 Creatinine 2.93, BUN 49, Potassium 3.8, Sodium 128, GFR 23 05/24/2020 Creatinine2.65, BUN60, Potassium3.4, Sodium137, [id number] 05/21/2020 Creatinine3.62, BUN92, Potassium4.4, [id number], [id number] A complete set of results  can be found in Results Review  Recommendations:Advised if condition worsens to call 911.  Copy sent to Dr [name] for review and recommendations if needed.  HF clinic appt on 12/9  Follow-up plan: ICM clinic phone appointment on12/13/2021to recheck fluid levels. 91 day device clinic remote transmission3/10/2020.  EP/Cardiology Office Visits: 08/02/2020 withAdvanced HF clinic.  Copy of ICM check sent to Dr.Allred.   3 month ICM trend: 07/26/2020    1 Year ICM trend:       Rosalene Billings, RN 07/27/2020 8:33 AM

## 2020-07-28 NOTE — Anesthesia Postprocedure Evaluation (Signed)
Anesthesia Post Note  Patient: Carl [name]  Procedure(s) Performed: CATARACT EXTRACTION PHACO AND INTRAOCULAR LENS PLACEMENT (IOC) RIGHT VISION BLUE (Right Eye)  Patient location during evaluation: PACU Anesthesia Type: MAC Level of consciousness: awake and alert and oriented Pain management: pain level controlled Vital Signs Assessment: post-procedure vital signs reviewed and stable Respiratory status: spontaneous breathing Cardiovascular status: blood pressure returned to baseline Anesthetic complications: no   No complications documented.   Last Vitals:  Vitals:   07/26/20 0859 07/26/20 0906  BP: (!) 82/56 (!) 85/52  Pulse: (!) 52 (!) 52  Resp: 18   Temp: (!) 36.4 C   SpO2: 95% 94%    Last Pain:  Vitals:   07/27/20 1413  TempSrc:   PainSc: 0-No pain                 Toriann Spadoni

## 2020-07-29 NOTE — Progress Notes (Signed)
Increase metolazone to twice weekly, Monday and Thursday. Repeat BMET in 1 week.

## 2020-07-30 ENCOUNTER — Other Ambulatory Visit (HOSPITAL_COMMUNITY): Payer: Self-pay

## 2020-07-30 ENCOUNTER — Inpatient Hospital Stay (HOSPITAL_BASED_OUTPATIENT_CLINIC_OR_DEPARTMENT_OTHER): Payer: No Typology Code available for payment source | Admitting: Oncology

## 2020-07-30 ENCOUNTER — Inpatient Hospital Stay: Payer: No Typology Code available for payment source

## 2020-07-30 ENCOUNTER — Encounter (HOSPITAL_COMMUNITY): Payer: Self-pay

## 2020-07-30 ENCOUNTER — Encounter: Payer: Self-pay | Admitting: Oncology

## 2020-07-30 VITALS — BP 91/56 | HR 63 | Resp 20

## 2020-07-30 VITALS — BP 91/65 | HR 67 | Temp 98.4°F | Resp 16 | Wt 163.1 lb

## 2020-07-30 DIAGNOSIS — I480 Paroxysmal atrial fibrillation: Secondary | ICD-10-CM | POA: Diagnosis present

## 2020-07-30 DIAGNOSIS — E860 Dehydration: Secondary | ICD-10-CM | POA: Diagnosis present

## 2020-07-30 DIAGNOSIS — M810 Age-related osteoporosis without current pathological fracture: Secondary | ICD-10-CM | POA: Diagnosis present

## 2020-07-30 DIAGNOSIS — Z95810 Presence of automatic (implantable) cardiac defibrillator: Secondary | ICD-10-CM

## 2020-07-30 DIAGNOSIS — E785 Hyperlipidemia, unspecified: Secondary | ICD-10-CM | POA: Diagnosis present

## 2020-07-30 DIAGNOSIS — D509 Iron deficiency anemia, unspecified: Secondary | ICD-10-CM | POA: Diagnosis not present

## 2020-07-30 DIAGNOSIS — I2510 Atherosclerotic heart disease of native coronary artery without angina pectoris: Secondary | ICD-10-CM | POA: Diagnosis present

## 2020-07-30 DIAGNOSIS — Z20822 Contact with and (suspected) exposure to covid-19: Secondary | ICD-10-CM | POA: Diagnosis present

## 2020-07-30 DIAGNOSIS — D631 Anemia in chronic kidney disease: Secondary | ICD-10-CM

## 2020-07-30 DIAGNOSIS — Z515 Encounter for palliative care: Secondary | ICD-10-CM

## 2020-07-30 DIAGNOSIS — N1832 Chronic kidney disease, stage 3b: Secondary | ICD-10-CM | POA: Diagnosis present

## 2020-07-30 DIAGNOSIS — Z9841 Cataract extraction status, right eye: Secondary | ICD-10-CM

## 2020-07-30 DIAGNOSIS — Z888 Allergy status to other drugs, medicaments and biological substances status: Secondary | ICD-10-CM

## 2020-07-30 DIAGNOSIS — Z833 Family history of diabetes mellitus: Secondary | ICD-10-CM

## 2020-07-30 DIAGNOSIS — K7469 Other cirrhosis of liver: Secondary | ICD-10-CM

## 2020-07-30 DIAGNOSIS — I5022 Chronic systolic (congestive) heart failure: Secondary | ICD-10-CM | POA: Diagnosis present

## 2020-07-30 DIAGNOSIS — Z87891 Personal history of nicotine dependence: Secondary | ICD-10-CM

## 2020-07-30 DIAGNOSIS — I130 Hypertensive heart and chronic kidney disease with heart failure and stage 1 through stage 4 chronic kidney disease, or unspecified chronic kidney disease: Secondary | ICD-10-CM | POA: Diagnosis present

## 2020-07-30 DIAGNOSIS — K7460 Unspecified cirrhosis of liver: Secondary | ICD-10-CM | POA: Diagnosis present

## 2020-07-30 DIAGNOSIS — Z8711 Personal history of peptic ulcer disease: Secondary | ICD-10-CM

## 2020-07-30 DIAGNOSIS — I739 Peripheral vascular disease, unspecified: Secondary | ICD-10-CM | POA: Diagnosis present

## 2020-07-30 DIAGNOSIS — H9190 Unspecified hearing loss, unspecified ear: Secondary | ICD-10-CM | POA: Diagnosis present

## 2020-07-30 DIAGNOSIS — Z7901 Long term (current) use of anticoagulants: Secondary | ICD-10-CM

## 2020-07-30 DIAGNOSIS — W010XXA Fall on same level from slipping, tripping and stumbling without subsequent striking against object, initial encounter: Secondary | ICD-10-CM | POA: Diagnosis present

## 2020-07-30 DIAGNOSIS — F79 Unspecified intellectual disabilities: Secondary | ICD-10-CM | POA: Diagnosis present

## 2020-07-30 DIAGNOSIS — D72829 Elevated white blood cell count, unspecified: Secondary | ICD-10-CM | POA: Diagnosis present

## 2020-07-30 DIAGNOSIS — N179 Acute kidney failure, unspecified: Secondary | ICD-10-CM | POA: Diagnosis present

## 2020-07-30 DIAGNOSIS — D62 Acute posthemorrhagic anemia: Principal | ICD-10-CM | POA: Diagnosis present

## 2020-07-30 DIAGNOSIS — Z8249 Family history of ischemic heart disease and other diseases of the circulatory system: Secondary | ICD-10-CM

## 2020-07-30 DIAGNOSIS — K219 Gastro-esophageal reflux disease without esophagitis: Secondary | ICD-10-CM | POA: Diagnosis present

## 2020-07-30 DIAGNOSIS — Z79899 Other long term (current) drug therapy: Secondary | ICD-10-CM

## 2020-07-30 DIAGNOSIS — I252 Old myocardial infarction: Secondary | ICD-10-CM

## 2020-07-30 DIAGNOSIS — K31811 Angiodysplasia of stomach and duodenum with bleeding: Secondary | ICD-10-CM | POA: Diagnosis present

## 2020-07-30 DIAGNOSIS — K5521 Angiodysplasia of colon with hemorrhage: Secondary | ICD-10-CM | POA: Diagnosis present

## 2020-07-30 DIAGNOSIS — E876 Hypokalemia: Secondary | ICD-10-CM | POA: Diagnosis present

## 2020-07-30 DIAGNOSIS — N1831 Chronic kidney disease, stage 3a: Secondary | ICD-10-CM

## 2020-07-30 DIAGNOSIS — K766 Portal hypertension: Secondary | ICD-10-CM | POA: Diagnosis present

## 2020-07-30 DIAGNOSIS — Z66 Do not resuscitate: Secondary | ICD-10-CM | POA: Diagnosis present

## 2020-07-30 DIAGNOSIS — I255 Ischemic cardiomyopathy: Secondary | ICD-10-CM | POA: Diagnosis present

## 2020-07-30 DIAGNOSIS — Z961 Presence of intraocular lens: Secondary | ICD-10-CM | POA: Diagnosis present

## 2020-07-30 DIAGNOSIS — Z7902 Long term (current) use of antithrombotics/antiplatelets: Secondary | ICD-10-CM

## 2020-07-30 MED ORDER — IRON SUCROSE 20 MG/ML IV SOLN
200.0000 mg | Freq: Once | INTRAVENOUS | Status: AC
  Administered 2020-07-30: 200 mg via INTRAVENOUS

## 2020-07-30 MED ORDER — METOLAZONE 2.5 MG PO TABS
ORAL_TABLET | ORAL | 3 refills | Status: DC
Start: 2020-07-30 — End: 2020-10-16

## 2020-07-30 MED ORDER — SODIUM CHLORIDE 0.9 % IV SOLN
INTRAVENOUS | Status: DC
  Filled 2020-07-30: qty 250

## 2020-07-30 NOTE — Progress Notes (Signed)
Spoke with brother [name].  He said he spoke with Kristi, EMT and aware that Metolazone will be increased to twice a week.  BMET scheduled for Wednesday 3:45 PM at Dr Claris Gladden office.  He reports he is not sure how much urine output patient has and is concerned. Advised if possible he can start measuring the output and patient has as urinal at home that he can use to do that. Explained urine should be clear pale yellow and if it is very dark then he may be getting dehydrated.  Advised he should drink up to 60 oz fluid daily.  He reports patient does not feel like eating and has eaten very little since the sister has been cooking at home for him the last several days.  He said patient is not physically able to do PT exercises and is weak. Advised to report symptoms to HF clinic at the 12/9 appointment so he can be evaluated for changes.

## 2020-07-30 NOTE — Progress Notes (Signed)
Hematology/Oncology follow up  note [facility] [phone] [phone]   Patient Care Team: [name], MD as PCP - General (Internal Medicine)  REFERRING PROVIDER: [name], MD  CHIEF COMPLAINTS/REASON FOR VISIT:  Follow-up for anemia  HISTORY OF PRESENTING ILLNESS:  Carl [name] is a  69 y.o.  male with PMH listed below who was referred to me for evaluation of anemia Reviewed patient's recent labs that was done.  05/25/2019 Labs revealed anemia with hemoglobin of 8.8 Reviewed patient's previous labs  anemia is chronic onset , duration is since 2016 No aggravating or improving factors.  Associated signs and symptoms: Patient reports fatigue. denies SOB with exertion. He is a poor historian Denies weight loss, easy bruising, hematochezia, hemoptysis, hematuria. Context: History of GI bleeding: denies               History of Chronic kidney disease                               Last colonoscopy: 2018 colonoscopy showed one small polyp in the sigmoid colon, removed with a hot snare. Resected and retrieved. bleeding colonic angioectasia. Treated with argon plasma coagulation (APC). -EGD 2018 showed esophagitis.   Lives by himself. Family sister/brother bring him food.  #  CHF, LVEF 20 to 25%, history of MV repair in May 2020.  On Plavix.  And aspirin.  INTERVAL HISTORY Carl [name] is a 69 y.o. male who has above history reviewed by me today presents for follow up visit for management of Anemia Problems and complaints are listed below: Patient is a poor historian.  He was accompanied by his sister. Patient has received IV Venofer treatments previously and tolerated well.  No new complaints.. Per sister, no reports of blood in urine or stool. 07/23/2020 colonoscopy showed preparation of the colon was inadequate.  Nonbleeding internal hemorrhoids.  Tuberculosis of the entire examined colon.  5 mm polyp in the cecum resected  and retrieved.  8 mm polyp in the transverse colon resected tissue not retrieved.  Incomplete resection. 06/28/2020, upper endoscopy showed small hiatal hernia, portal hypertensive gastropathy.   Review of Systems  Unable to perform ROS: Patient nonverbal  Constitutional: Positive for fatigue.     MEDICAL HISTORY:  Past Medical History:  Diagnosis Date  . AICD (automatic cardioverter/defibrillator) present   . Anemia   . Anxiety   . Arthritis   . Brunner's gland hyperplasia of duodenum   . CHF (congestive heart failure) ([place])   . Chronic kidney disease   . Cirrhosis of liver ([place])   . Coronary artery disease   . Depression   . Dysrhythmia    atrial fib  . External hemorrhoids   . Fracture of femoral neck, left ([place]) 07/21/2018  . GERD (gastroesophageal reflux disease)   . Gout   . Hearing loss   . Heart murmur    mirtal valve insufficiency  . Hepatic failure ([place])   . HH (hiatus hernia)   . Hyperlipidemia   . Hypokalemia   . Hyposmolality   . Intellectual disability    mild  . Internal hemorrhoids   . Ischemic cardiomyopathy   . Leg fracture, left   . Myocardial infarction ([place])   . Osteoporosis   . Paronychia   . Peripheral vascular disease ([place])   . Pneumonia   . Psoriasis   . Psoriasis   . PUD (peptic ulcer disease)   .  Schatzki's ring   . Tubular adenoma of colon   . Vitiligo     SURGICAL HISTORY: Past Surgical History:  Procedure Laterality Date  . ATRIAL SEPTAL DEFECT(ASD) CLOSURE N/A 12/30/2018   Procedure: ATRIAL SEPTAL DEFECT(ASD) CLOSURE;  Surgeon: [name], MD;  Location: [place] [facility];  Service: Cardiovascular;  Laterality: N/A;  . CARDIAC SURGERY     ICD  . CARDIOVERSION N/A 11/09/2019   Procedure: CARDIOVERSION;  Surgeon: [name], MD;  Location: [facility] ENDOSCOPY;  Service: Cardiovascular;  Laterality: N/A;  . CARDIOVERSION N/A 12/21/2019   Procedure: CARDIOVERSION;  Surgeon: [name], MD;  Location: [place];   Service: Cardiovascular;  Laterality: N/A;  . CATARACT EXTRACTION W/PHACO Right 07/26/2020   Procedure: CATARACT EXTRACTION PHACO AND INTRAOCULAR LENS PLACEMENT ([place]) RIGHT VISION BLUE;  Surgeon: Birder Robson, MD;  Location: [facility] ORS;  Service: Ophthalmology;  Laterality: Right;  [place] 01:14.1 CDE 13.65 Fluid Pack Lot # [id number]  . COLONOSCOPY WITH ESOPHAGOGASTRODUODENOSCOPY (EGD)    . COLONOSCOPY WITH PROPOFOL N/A 08/24/2017   Procedure: COLONOSCOPY WITH PROPOFOL;  Surgeon: [name], MD;  Location: [facility] ENDOSCOPY;  Service: Endoscopy;  Laterality: N/A;  . COLONOSCOPY WITH PROPOFOL N/A 07/23/2020   Procedure: COLONOSCOPY WITH PROPOFOL;  Surgeon: Toledo, [name], MD;  Location: [facility] ENDOSCOPY;  Service: Gastroenterology;  Laterality: N/A;  . CORONARY ARTERY BYPASS GRAFT N/A 08/03/2014   Procedure: CORONARY ARTERY BYPASS GRAFTING (CABG);  Surgeon: [name], MD;  Location: [place];  Service: Open Heart Surgery;  Laterality: N/A;  . EP IMPLANTABLE DEVICE N/A 05/01/2015   MDT ICD implanted for primary prevention of sudden death  . ESOPHAGOGASTRODUODENOSCOPY N/A 06/28/2020   Procedure: ESOPHAGOGASTRODUODENOSCOPY (EGD);  Surgeon: Clarene Essex, MD;  Location: [place];  Service: Endoscopy;  Laterality: N/A;  . ESOPHAGOGASTRODUODENOSCOPY (EGD) WITH PROPOFOL N/A 04/16/2015   Procedure: ESOPHAGOGASTRODUODENOSCOPY (EGD) WITH PROPOFOL;  Surgeon: [name], MD;  Location: [facility] ENDOSCOPY;  Service: Endoscopy;  Laterality: N/A;  . ESOPHAGOGASTRODUODENOSCOPY (EGD) WITH PROPOFOL N/A 08/24/2017   Procedure: ESOPHAGOGASTRODUODENOSCOPY (EGD) WITH PROPOFOL;  Surgeon: [name], MD;  Location: [facility] ENDOSCOPY;  Service: Endoscopy;  Laterality: N/A;  . FRACTURE SURGERY Left    ORIF lower leg  . HERNIA REPAIR    . HIP PINNING,CANNULATED Left 07/22/2018   Procedure: CANNULATED HIP PINNING;  Surgeon: [name], MD;  Location: [place];  Service: Orthopedics;  Laterality: Left;  .  MITRAL VALVE REPAIR N/A 12/30/2018   Procedure: MITRAL VALVE REPAIR;  Surgeon: [name], MD;  Location: [place] [facility];  Service: Cardiovascular;  Laterality: N/A;  . RIGHT/LEFT HEART CATH AND CORONARY/GRAFT ANGIOGRAPHY N/A 12/21/2018   Procedure: RIGHT/LEFT HEART CATH AND CORONARY/GRAFT ANGIOGRAPHY;  Surgeon: [name], MD;  Location: [place] [facility];  Service: Cardiovascular;  Laterality: N/A;  . TEE WITHOUT CARDIOVERSION N/A 08/03/2014   Procedure: TRANSESOPHAGEAL ECHOCARDIOGRAM (TEE);  Surgeon: [name], MD;  Location: [place];  Service: Open Heart Surgery;  Laterality: N/A;  . TEE WITHOUT CARDIOVERSION N/A 02/10/2018   Procedure: TRANSESOPHAGEAL ECHOCARDIOGRAM (TEE);  Surgeon: [name], MD;  Location: [facility] ENDOSCOPY;  Service: Cardiovascular;  Laterality: N/A;  . TEE WITHOUT CARDIOVERSION N/A 11/09/2019   Procedure: TRANSESOPHAGEAL ECHOCARDIOGRAM (TEE);  Surgeon: [name], MD;  Location: [facility] ENDOSCOPY;  Service: Cardiovascular;  Laterality: N/A;    SOCIAL HISTORY: Social History   Socioeconomic History  . Marital status: Single    Spouse name: Not on file  . Number of children: Not on  file  . Years of education: Not on file  . Highest education level: Not on file  Occupational History  . Not on file  Tobacco Use  . Smoking status: Former Smoker    Quit date: 2014    Years since quitting: 7.9  . Smokeless tobacco: Never Used  . Tobacco comment: 07/30/2017 Quit in 2014  Vaping Use  . Vaping Use: Never used  Substance and Sexual Activity  . Alcohol use: No    Alcohol/week: 0.0 standard drinks  . Drug use: No  . Sexual activity: Never  Other Topics Concern  . Not on file  Social History Narrative   Pt lives in [place] with his mother.   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 Difficulty of Paying Living Expenses: Not on file  Food Insecurity:   . Worried About Ship broker in the Last Year: Not on file  . Ran Out of Food in the Last Year: Not on file  Transportation Needs:   . Lack of Transportation (Medical): Not on file  . Lack of Transportation (Non-Medical): Not on file  Physical Activity:   . Days of Exercise per Week: Not on file  . Minutes of Exercise per Session: Not on file  Stress:   . Feeling of Stress : Not on file  Social Connections:   . Frequency of Communication with Friends and Family: Not on file  . Frequency of Social Gatherings with Friends and Family: Not on file  . Attends Religious Services: Not on file  . Active Member of Clubs or Organizations: Not on file  . Attends Archivist Meetings: Not on file  . Marital Status: Not on file  Intimate Partner Violence:   . Fear of Current or Ex-Partner: Not on file  . Emotionally Abused: Not on file  . Physically Abused: Not on file  . Sexually Abused: Not on file    FAMILY HISTORY: Family History  Problem Relation Age of Onset  . Valvular heart disease Mother        Ruptured valve  . CAD Father   . Heart Problems Brother        Stents x 4  . Diabetes Brother   . Prostate cancer Neg Hx   . Bladder Cancer Neg Hx   . Kidney cancer Neg Hx     ALLERGIES:  is allergic to spironolactone.  MEDICATIONS:  Current Outpatient Medications  Medication Sig Dispense Refill  . acetaminophen (TYLENOL) 500 MG tablet Take 500-1,000 mg by mouth every 6 (six) hours as needed (for pain.).    Marland Kitchen ELIQUIS 5 MG TABS tablet Take 5 mg by mouth 2 (two) times daily.    Marland Kitchen allopurinol (ZYLOPRIM) 100 MG tablet Take 100 mg by mouth daily.     Marland Kitchen amiodarone (PACERONE) 200 MG tablet Take 1 tablet (200 mg total) by mouth daily. 90 tablet 1  . Ascorbic Acid (VITAMIN C) 100 MG tablet Take 100 mg by mouth daily.    Marland Kitchen atorvastatin (LIPITOR) 40 MG tablet Take 40 mg by mouth every evening.    . carvedilol (COREG) 3.125 MG tablet Take 1 tablet (3.125 mg total) by mouth 2 (two) times daily. 60 tablet 3   . Cholecalciferol (VITAMIN D3 SUPER STRENGTH) 50 MCG (2000 UT) TABS Take 2,000 Units by mouth at bedtime.     . cyanocobalamin 500 MCG tablet Take 500 mcg by mouth daily.     Marland Kitchen  docusate sodium (COLACE) 100 MG capsule Take 100 mg by mouth daily as needed for mild constipation.    . DULoxetine (CYMBALTA) 30 MG capsule Take 30 mg by mouth at bedtime.     . ferrous sulfate 325 (65 FE) MG tablet Take 1 tablet (325 mg total) by mouth daily with breakfast. 30 tablet 3  . gabapentin (NEURONTIN) 300 MG capsule Take 300 mg by mouth at bedtime.     Marland Kitchen ketoconazole (NIZORAL) 2 % shampoo Apply 1 application topically 2 (two) times a week. Mon and Thu    . lactulose (CHRONULAC) 10 GM/15ML solution Take 30 mLs (20 g total) by mouth daily. 236 mL 0  . metolazone (ZAROXOLYN) 2.5 MG tablet Take 1 tablet twice a week, every Monday and Thursday. Take 20 mEq Potassium with Metolazone. 24 tablet 3  . Multiple Vitamin (MULTIVITAMIN) tablet Take 1 tablet by mouth at bedtime.     . pantoprazole (PROTONIX) 40 MG tablet TAKE 1 TABLET BY MOUTH EVERY DAY (Patient taking differently: Take 40 mg by mouth daily. ) 90 tablet 2  . potassium chloride SA (KLOR-CON M20) 20 MEQ tablet Take 1 tablet (20 mEq total) by mouth daily. (Patient taking differently: Take 20 mEq by mouth daily. He is taking an extra 20 meq with metolazone) 30 tablet 3  . sucralfate (CARAFATE) 1 g tablet Take 1 tablet (1 g total) by mouth 2 (two) times daily.    Marland Kitchen torsemide (DEMADEX) 20 MG tablet Take 4 tablets (80 mg total) by mouth daily. 120 tablet 1   No current facility-administered medications for this visit.     PHYSICAL EXAMINATION: ECOG PERFORMANCE STATUS: 2 - Symptomatic, <50% confined to bed Vitals:   07/30/20 1405  BP: 91/65  Pulse: 67  Resp: 16  Temp: 98.4 F (36.9 C)   Filed Weights   07/30/20 1405  Weight: 163 lb 1.6 oz (74 kg)    Physical Exam Constitutional:      General: He is not in acute distress.    Appearance: He is not  ill-appearing.     Comments: Patient sits in the wheelchair  HENT:     Head: Normocephalic and atraumatic.  Eyes:     General: No scleral icterus.    Pupils: Pupils are equal, round, and reactive to light.  Cardiovascular:     Rate and Rhythm: Normal rate and regular rhythm.     Heart sounds: Normal heart sounds.  Pulmonary:     Effort: Pulmonary effort is normal. No respiratory distress.     Breath sounds: Normal breath sounds. No wheezing.  Abdominal:     General: Bowel sounds are normal. There is no distension.     Palpations: Abdomen is soft. There is no mass.     Tenderness: There is no abdominal tenderness.  Musculoskeletal:        General: No swelling or deformity. Normal range of motion.     Cervical back: Normal range of motion and neck supple.  Skin:    General: Skin is warm and dry.     Findings: No erythema or rash.  Neurological:     Mental Status: He is alert. Mental status is at baseline.     Comments: Flat       LABORATORY DATA:  I have reviewed the data as listed Lab Results  Component Value Date   WBC 6.0 07/27/2020   HGB 8.3 (L) 07/27/2020   HCT 25.4 (L) 07/27/2020   MCV 95.1 07/27/2020   PLT  267 07/27/2020   Recent Labs    01/12/20 1445 01/12/20 1445 04/20/20 1206 04/20/20 1206 04/26/20 1240 04/26/20 1240 05/21/20 1151 05/21/20 1151 05/24/20 0953 06/14/20 1456 06/27/20 1122 06/28/20 0747 07/02/20 0527 07/03/20 0547 07/12/20 1003  NA 129*   < > 129*   < > 130*   < > 125*   < > 127*   < > 127*   < > 130* 130* 132*  K 3.6   < > 3.8   < > 4.0   < > 4.4   < > 3.4*   < > 3.6   < > 3.5 4.7 4.1  CL 86*   < > 88*   < > 92*   < > 85*   < > 91*   < > 85*   < > 92* 93* 93*  CO2 28   < > 29   < > 27   < > 26   < > 27   < > 28   < > 28 28 30   GLUCOSE 103*   < > 123*   < > 138*   < > 108*   < > 150*   < > 116*   < > 108* 96 112*  BUN 64*   < > 43*   < > 51*   < > 92*   < > 60*   < > 89*   < > 35* 34* 38*  CREATININE 2.00*   < > 2.75*   < > 2.60*   <  > 3.62*   < > 2.65*   < > 3.51*   < > 2.14* 2.20* 2.26*  CALCIUM 9.8   < > 9.5   < > 9.7   < > 9.5   < > 9.2   < > 9.3   < > 9.0 8.8* 9.1  GFRNONAA 33*   < > 23*   < > 24*   < > 16*   < > 24*   < > 18*   < > 33* 32* 31*  GFRAA 39*   < > 26*   < > 28*  --  19*  --  27*  --   --   --   --   --   --   PROT 7.8  --  7.5  --   --   --   --   --   --   --  6.1*  --   --   --   --   ALBUMIN 3.8  --  3.9  --   --   --   --   --   --   --  3.3*  --   --   --   --   AST 25  --  31  --   --   --   --   --   --   --  33  --   --   --   --   ALT 19  --  26  --   --   --   --   --   --   --  22  --   --   --   --   ALKPHOS 129*  --  125  --   --   --   --   --   --   --  5  --   --   --   --  BILITOT 1.5*  --  1.3*  --   --   --   --   --   --   --  1.3*  --   --   --   --   BILIDIR 0.3*  --   --   --   --   --   --   --   --   --   --   --   --   --   --   IBILI 1.2*  --   --   --   --   --   --   --   --   --   --   --   --   --   --    < > = values in this interval not displayed.   Iron/TIBC/Ferritin/ %Sat    Component Value Date/Time   IRON 358 (H) 07/27/2020 1309   TIBC 423 07/27/2020 1309   FERRITIN 19 (L) 07/27/2020 1309   IRONPCTSAT 85 (H) 07/27/2020 1309     RADIOGRAPHIC STUDIES: I have personally reviewed the radiological images as listed and agreed with the findings in the report. DG Chest Portable 1 View  Result Date: 06/27/2020 CLINICAL DATA:  69 year old male with fatigue. Lower extremity swelling. EXAM: PORTABLE CHEST 1 VIEW COMPARISON:  Chest radiographs 03/03/2019 and earlier. FINDINGS: Portable AP semi upright view at 1210 hours. Chronic left chest cardiac AICD. Prior CABG. Stable cardiomegaly and mediastinal contours. Mildly lower lung volumes. Mild new veiling opacity at the lung bases suspicious for pleural effusion. Pulmonary vascular congestion similar to but increased from last year. No pneumothorax. No air bronchograms. Paucity of bowel gas in the upper abdomen. Chronic right  rib fractures. No acute osseous abnormality identified. IMPRESSION: Suspect acute pulmonary interstitial edema with small bilateral pleural effusions. Chronic cardiomegaly, AICD. Electronically Signed   By: Genevie Ann M.D.   On: 06/27/2020 12:23   CUP PACEART REMOTE DEVICE CHECK  Result Date: 07/27/2020 Scheduled remote reviewed. Normal device function.  Optivol elevated and thoracic impedance below reference line and trending down. Routing to triage for further evaluation. Next remote 91 days. HB    ASSESSMENT & PLAN:  1. Iron deficiency anemia, unspecified iron deficiency anemia type   2. Anemia in stage 3a chronic kidney disease (HCC)   3. Other cirrhosis of liver (HCC)    #Anemia, multifactorial From anemia secondary to chronic kidney disease as well as iron deficiency. Labs reviewed and discussed with patient. Hemoglobin has significantly decreased since his last visit with a hemoglobin on 02/28/2019 1P 12.2. Today's hemoglobin is 8.3. Iron panel showed TIBC 423, increased iron saturation 85, decreased ferritin of 19. I recommend patient to proceed with weekly IV Venofer 200 mg x 2.  Etiology of the iron deficiency, probably secondary to chronic blood loss. Patient has had EGD and colonoscopy done no obvious bleeding source was found.  I defer gastroenterology for discussion of need of capsule study.  Cirrhosis, due to fatty liver disease.  Follow-up with GI.    Orders Placed This Encounter  Procedures  . CBC with Differential/Platelet    Standing Status:   Future    Standing Expiration Date:   07/30/2021  . Ferritin    Standing Status:   Future    Standing Expiration Date:   07/30/2021  . Iron and TIBC    Standing Status:   Future    Standing Expiration Date:   07/30/2021    All questions were answered.  The patient knows to call the clinic with any problems questions or concerns. Ollen Gross, MD   Earlie Server, MD, PhD 07/30/2020

## 2020-07-30 NOTE — Progress Notes (Signed)
Patient stable at discharge.

## 2020-07-30 NOTE — Progress Notes (Signed)
Spoke with [name], EMT with paramedicine program.  Kenton Kingfisher I spoke with brother and concerned about patient's current condition since brother reviewed symptoms.  Kristi advised she is going to call HF clinic triage to discuss patients symptoms and check if patient should go to ED for evaluation.  She will let me know the outcome once she speaks with HF clinic.

## 2020-07-30 NOTE — Progress Notes (Signed)
Spoke with [name], EMT with paramedicine program and advised of medication change to increase Metolazone 1 tablet to twice a week every Monday and Thursday. Advised he will need a BMET in a week and will call brother to schedule the appointment.  Will change ICM recheck from Monday 12/13 to Tuesday 12/14 to check effectiveness after Metolazone.

## 2020-07-30 NOTE — Progress Notes (Signed)
Patient is accompanied by his sister today and she is unsure of what medications he is currently taking but certain that he does take Eliquis.  Recommend her take a picture of his current meds or have a list to take to his Md appts.

## 2020-07-30 NOTE — Progress Notes (Signed)
Received a phone call from [name] explaining Atzin is fluid overloaded.  She states Dr [name] has advised to give him metolazone twice a week with extra potassium on Mondays and Thursday.  She has ordered blood work in 1 week. Had a home visit with [name]. Skanda Worlds a metolazone with extra potassium to take today.  Switched his boxes to metolazone on Mondays and Thursday with extra potassium on those days.  Crandall appears to be looking better today, his brother [name] says he looks better but not getting around as good.  Drayk is not weighing, says he can not stand on scale.  He was mad when I arrived, he states his sister has not came and cooked his breakfast yet and he is hungry.  Julio Sicks his brother when I left, he states she is there til late at night and she can not be there all the time.  Thoma will have to make a bowl of cereal or eat his boiled eggs that are cooked.  Mackson has got use to them coming and doing everything for him and now he does not want to do anything his self.  [name] is going everyday and giving his meds to him to make sure he is taking them.  Will continue to visit for heart failure, diet and medication compliance.   [place] [phone]

## 2020-07-31 ENCOUNTER — Encounter (HOSPITAL_COMMUNITY): Payer: Medicare Other

## 2020-07-31 ENCOUNTER — Other Ambulatory Visit (HOSPITAL_COMMUNITY): Payer: Self-pay | Admitting: Cardiology

## 2020-08-02 ENCOUNTER — Other Ambulatory Visit: Payer: Self-pay

## 2020-08-02 ENCOUNTER — Encounter (HOSPITAL_COMMUNITY): Payer: Self-pay

## 2020-08-02 ENCOUNTER — Ambulatory Visit (INDEPENDENT_AMBULATORY_CARE_PROVIDER_SITE_OTHER): Payer: Medicare Other | Admitting: Podiatry

## 2020-08-02 ENCOUNTER — Telehealth (HOSPITAL_COMMUNITY): Payer: Self-pay

## 2020-08-02 ENCOUNTER — Encounter: Payer: Self-pay | Admitting: Podiatry

## 2020-08-02 ENCOUNTER — Inpatient Hospital Stay
Admission: EM | Admit: 2020-08-02 | Discharge: 2020-08-10 | DRG: 811 | Disposition: A | Discharge status: Skilled Nursing Facility | Payer: Medicare Other | Attending: Internal Medicine | Admitting: Internal Medicine

## 2020-08-02 ENCOUNTER — Ambulatory Visit (HOSPITAL_COMMUNITY)
Admission: RE | Admit: 2020-08-02 | Discharge: 2020-08-02 | Disposition: A | Discharge status: Home / Self Care | Payer: Medicare Other | Source: Ambulatory Visit | Attending: Cardiology | Admitting: Cardiology

## 2020-08-02 ENCOUNTER — Emergency Department: Payer: Medicare Other

## 2020-08-02 VITALS — BP 101/63 | HR 63 | Wt 152.6 lb

## 2020-08-02 DIAGNOSIS — I340 Nonrheumatic mitral (valve) insufficiency: Secondary | ICD-10-CM | POA: Diagnosis not present

## 2020-08-02 DIAGNOSIS — K921 Melena: Secondary | ICD-10-CM | POA: Diagnosis not present

## 2020-08-02 DIAGNOSIS — K219 Gastro-esophageal reflux disease without esophagitis: Secondary | ICD-10-CM | POA: Diagnosis present

## 2020-08-02 DIAGNOSIS — K922 Gastrointestinal hemorrhage, unspecified: Secondary | ICD-10-CM | POA: Diagnosis not present

## 2020-08-02 DIAGNOSIS — R7989 Other specified abnormal findings of blood chemistry: Secondary | ICD-10-CM | POA: Diagnosis not present

## 2020-08-02 DIAGNOSIS — K766 Portal hypertension: Secondary | ICD-10-CM | POA: Diagnosis present

## 2020-08-02 DIAGNOSIS — I493 Ventricular premature depolarization: Secondary | ICD-10-CM | POA: Diagnosis not present

## 2020-08-02 DIAGNOSIS — W010XXA Fall on same level from slipping, tripping and stumbling without subsequent striking against object, initial encounter: Secondary | ICD-10-CM | POA: Diagnosis not present

## 2020-08-02 DIAGNOSIS — M79675 Pain in left toe(s): Secondary | ICD-10-CM

## 2020-08-02 DIAGNOSIS — W19XXXA Unspecified fall, initial encounter: Secondary | ICD-10-CM

## 2020-08-02 DIAGNOSIS — I480 Paroxysmal atrial fibrillation: Secondary | ICD-10-CM | POA: Diagnosis present

## 2020-08-02 DIAGNOSIS — Z79899 Other long term (current) drug therapy: Secondary | ICD-10-CM | POA: Diagnosis not present

## 2020-08-02 DIAGNOSIS — K5521 Angiodysplasia of colon with hemorrhage: Secondary | ICD-10-CM | POA: Diagnosis present

## 2020-08-02 DIAGNOSIS — D62 Acute posthemorrhagic anemia: Secondary | ICD-10-CM | POA: Diagnosis present

## 2020-08-02 DIAGNOSIS — I2510 Atherosclerotic heart disease of native coronary artery without angina pectoris: Secondary | ICD-10-CM | POA: Diagnosis not present

## 2020-08-02 DIAGNOSIS — M79674 Pain in right toe(s): Secondary | ICD-10-CM | POA: Diagnosis not present

## 2020-08-02 DIAGNOSIS — K7460 Unspecified cirrhosis of liver: Secondary | ICD-10-CM | POA: Diagnosis present

## 2020-08-02 DIAGNOSIS — I5022 Chronic systolic (congestive) heart failure: Secondary | ICD-10-CM

## 2020-08-02 DIAGNOSIS — B351 Tinea unguium: Secondary | ICD-10-CM

## 2020-08-02 DIAGNOSIS — D689 Coagulation defect, unspecified: Secondary | ICD-10-CM

## 2020-08-02 DIAGNOSIS — Z8249 Family history of ischemic heart disease and other diseases of the circulatory system: Secondary | ICD-10-CM | POA: Diagnosis not present

## 2020-08-02 DIAGNOSIS — Z515 Encounter for palliative care: Secondary | ICD-10-CM | POA: Diagnosis not present

## 2020-08-02 DIAGNOSIS — M810 Age-related osteoporosis without current pathological fracture: Secondary | ICD-10-CM | POA: Diagnosis present

## 2020-08-02 DIAGNOSIS — K31811 Angiodysplasia of stomach and duodenum with bleeding: Secondary | ICD-10-CM | POA: Diagnosis present

## 2020-08-02 DIAGNOSIS — Z7189 Other specified counseling: Secondary | ICD-10-CM | POA: Diagnosis not present

## 2020-08-02 DIAGNOSIS — D72829 Elevated white blood cell count, unspecified: Secondary | ICD-10-CM | POA: Diagnosis present

## 2020-08-02 DIAGNOSIS — N179 Acute kidney failure, unspecified: Secondary | ICD-10-CM | POA: Diagnosis present

## 2020-08-02 DIAGNOSIS — Z87891 Personal history of nicotine dependence: Secondary | ICD-10-CM | POA: Diagnosis not present

## 2020-08-02 DIAGNOSIS — F32A Depression, unspecified: Secondary | ICD-10-CM | POA: Diagnosis not present

## 2020-08-02 DIAGNOSIS — N183 Chronic kidney disease, stage 3 unspecified: Secondary | ICD-10-CM | POA: Insufficient documentation

## 2020-08-02 DIAGNOSIS — F70 Mild intellectual disabilities: Secondary | ICD-10-CM | POA: Insufficient documentation

## 2020-08-02 DIAGNOSIS — N1832 Chronic kidney disease, stage 3b: Secondary | ICD-10-CM | POA: Diagnosis present

## 2020-08-02 DIAGNOSIS — I5023 Acute on chronic systolic (congestive) heart failure: Secondary | ICD-10-CM | POA: Diagnosis present

## 2020-08-02 DIAGNOSIS — F79 Unspecified intellectual disabilities: Secondary | ICD-10-CM | POA: Diagnosis present

## 2020-08-02 DIAGNOSIS — I739 Peripheral vascular disease, unspecified: Secondary | ICD-10-CM

## 2020-08-02 DIAGNOSIS — Z951 Presence of aortocoronary bypass graft: Secondary | ICD-10-CM | POA: Diagnosis not present

## 2020-08-02 DIAGNOSIS — H9190 Unspecified hearing loss, unspecified ear: Secondary | ICD-10-CM | POA: Diagnosis present

## 2020-08-02 DIAGNOSIS — I252 Old myocardial infarction: Secondary | ICD-10-CM | POA: Diagnosis not present

## 2020-08-02 DIAGNOSIS — Z7901 Long term (current) use of anticoagulants: Secondary | ICD-10-CM | POA: Insufficient documentation

## 2020-08-02 DIAGNOSIS — D631 Anemia in chronic kidney disease: Secondary | ICD-10-CM | POA: Insufficient documentation

## 2020-08-02 DIAGNOSIS — N189 Chronic kidney disease, unspecified: Secondary | ICD-10-CM | POA: Diagnosis not present

## 2020-08-02 DIAGNOSIS — Z95810 Presence of automatic (implantable) cardiac defibrillator: Secondary | ICD-10-CM | POA: Diagnosis not present

## 2020-08-02 DIAGNOSIS — E860 Dehydration: Secondary | ICD-10-CM | POA: Diagnosis present

## 2020-08-02 DIAGNOSIS — Z20822 Contact with and (suspected) exposure to covid-19: Secondary | ICD-10-CM | POA: Diagnosis present

## 2020-08-02 DIAGNOSIS — Z66 Do not resuscitate: Secondary | ICD-10-CM | POA: Diagnosis present

## 2020-08-02 DIAGNOSIS — I255 Ischemic cardiomyopathy: Secondary | ICD-10-CM | POA: Diagnosis not present

## 2020-08-02 DIAGNOSIS — Z8711 Personal history of peptic ulcer disease: Secondary | ICD-10-CM | POA: Diagnosis not present

## 2020-08-02 DIAGNOSIS — E876 Hypokalemia: Secondary | ICD-10-CM | POA: Diagnosis present

## 2020-08-02 DIAGNOSIS — I130 Hypertensive heart and chronic kidney disease with heart failure and stage 1 through stage 4 chronic kidney disease, or unspecified chronic kidney disease: Secondary | ICD-10-CM | POA: Diagnosis present

## 2020-08-02 LAB — CBC WITH DIFFERENTIAL/PLATELET
Abs Immature Granulocytes: 0.14 10*3/uL — ABNORMAL HIGH (ref 0.00–0.07)
Basophils Absolute: 0 10*3/uL (ref 0.0–0.1)
Basophils Relative: 0 %
Eosinophils Absolute: 0.2 10*3/uL (ref 0.0–0.5)
Eosinophils Relative: 2 %
HCT: 21.2 % — ABNORMAL LOW (ref 39.0–52.0)
Hemoglobin: 6.8 g/dL — ABNORMAL LOW (ref 13.0–17.0)
Immature Granulocytes: 1 %
Lymphocytes Relative: 5 %
Lymphs Abs: 0.6 10*3/uL — ABNORMAL LOW (ref 0.7–4.0)
MCH: 32.1 pg (ref 26.0–34.0)
MCHC: 32.1 g/dL (ref 30.0–36.0)
MCV: 100 fL (ref 80.0–100.0)
Monocytes Absolute: 0.9 10*3/uL (ref 0.1–1.0)
Monocytes Relative: 8 %
Neutro Abs: 9.2 10*3/uL — ABNORMAL HIGH (ref 1.7–7.7)
Neutrophils Relative %: 84 %
Platelets: 263 10*3/uL (ref 150–400)
RBC: 2.12 MIL/uL — ABNORMAL LOW (ref 4.22–5.81)
RDW: 21.4 % — ABNORMAL HIGH (ref 11.5–15.5)
WBC: 11 10*3/uL — ABNORMAL HIGH (ref 4.0–10.5)
nRBC: 0.8 % — ABNORMAL HIGH (ref 0.0–0.2)

## 2020-08-02 LAB — HEPATIC FUNCTION PANEL
ALT: 32 U/L (ref 0–44)
AST: 49 U/L — ABNORMAL HIGH (ref 15–41)
Albumin: 3.1 g/dL — ABNORMAL LOW (ref 3.5–5.0)
Alkaline Phosphatase: 114 U/L (ref 38–126)
Bilirubin, Direct: 0.7 mg/dL — ABNORMAL HIGH (ref 0.0–0.2)
Indirect Bilirubin: 1.1 mg/dL — ABNORMAL HIGH (ref 0.3–0.9)
Total Bilirubin: 1.8 mg/dL — ABNORMAL HIGH (ref 0.3–1.2)
Total Protein: 6.2 g/dL — ABNORMAL LOW (ref 6.5–8.1)

## 2020-08-02 LAB — BASIC METABOLIC PANEL
Anion gap: 12 (ref 5–15)
Anion gap: 14 (ref 5–15)
BUN: 72 mg/dL — ABNORMAL HIGH (ref 8–23)
BUN: 82 mg/dL — ABNORMAL HIGH (ref 8–23)
CO2: 32 mmol/L (ref 22–32)
CO2: 30 mmol/L (ref 22–32)
Calcium: 9.1 mg/dL (ref 8.9–10.3)
Calcium: 8.8 mg/dL — ABNORMAL LOW (ref 8.9–10.3)
Chloride: 86 mmol/L — ABNORMAL LOW (ref 98–111)
Chloride: 87 mmol/L — ABNORMAL LOW (ref 98–111)
Creatinine, Ser: 2.91 mg/dL — ABNORMAL HIGH (ref 0.61–1.24)
Creatinine, Ser: 2.91 mg/dL — ABNORMAL HIGH (ref 0.61–1.24)
GFR, Estimated: 23 mL/min — ABNORMAL LOW (ref >60)
GFR, Estimated: 23 mL/min — ABNORMAL LOW (ref >60)
Glucose, Bld: 129 mg/dL — ABNORMAL HIGH (ref 70–99)
Glucose, Bld: 125 mg/dL — ABNORMAL HIGH (ref 70–99)
Potassium: 2.6 mmol/L — CL (ref 3.5–5.1)
Potassium: 3 mmol/L — ABNORMAL LOW (ref 3.5–5.1)
Sodium: 130 mmol/L — ABNORMAL LOW (ref 135–145)
Sodium: 131 mmol/L — ABNORMAL LOW (ref 135–145)

## 2020-08-02 LAB — BRAIN NATRIURETIC PEPTIDE: B Natriuretic Peptide: 1475.7 pg/mL — ABNORMAL HIGH (ref 0.0–100.0)

## 2020-08-02 LAB — TROPONIN I (HIGH SENSITIVITY): Troponin I (High Sensitivity): 42 ng/L — ABNORMAL HIGH (ref <18)

## 2020-08-02 LAB — MAGNESIUM: Magnesium: 2.3 mg/dL (ref 1.7–2.4)

## 2020-08-02 MED ORDER — TORSEMIDE 100 MG PO TABS
100.0000 mg | ORAL_TABLET | Freq: Every day | ORAL | 3 refills | Status: DC
Start: 2020-08-02 — End: 2020-08-10

## 2020-08-02 MED ORDER — PANTOPRAZOLE SODIUM 40 MG IV SOLR
40.0000 mg | Freq: Two times a day (BID) | INTRAVENOUS | Status: DC
  Administered 2020-08-06 – 2020-08-07 (×3): 40 mg via INTRAVENOUS
  Filled 2020-08-02 (×3): qty 40

## 2020-08-02 MED ORDER — LACTATED RINGERS IV BOLUS
250.0000 mL | Freq: Once | INTRAVENOUS | Status: AC
  Administered 2020-08-02: 250 mL via INTRAVENOUS

## 2020-08-02 MED ORDER — SODIUM CHLORIDE 0.9 % IV SOLN
10.0000 mL/h | Freq: Once | INTRAVENOUS | Status: AC
  Administered 2020-08-03: 10 mL/h via INTRAVENOUS

## 2020-08-02 MED ORDER — SODIUM CHLORIDE 0.9 % IV SOLN
8.0000 mg/h | INTRAVENOUS | Status: AC
  Administered 2020-08-03 – 2020-08-05 (×6): 8 mg/h via INTRAVENOUS
  Filled 2020-08-02 (×7): qty 80

## 2020-08-02 MED ORDER — SODIUM CHLORIDE 0.9 % IV SOLN
80.0000 mg | Freq: Once | INTRAVENOUS | Status: AC
  Administered 2020-08-02: 80 mg via INTRAVENOUS
  Filled 2020-08-02: qty 80

## 2020-08-02 NOTE — ED Triage Notes (Signed)
First RN Note: pt to ED via [facility] from home, per EMS pt to ED s/p fall. Per EMS pt with mechanical fall on the front steps, per EMS pt with 2 abrasions to forehead, denies LOC. EMS reports that patient also c/o lower back pain.   Per EMS pt's BP 84/58, however EMS reports pt hypotensive at baseline. Per EMS pt A&O x4.

## 2020-08-02 NOTE — Telephone Encounter (Signed)
Malena Edman, RN  08/02/2020 4:38 PM EST Back to Top     Patient's brother advised and verbalized understanding. Lab appt scheduled for tomorrow at 1545. Lab orders placed

## 2020-08-02 NOTE — Progress Notes (Signed)
Remote ICD transmission.   

## 2020-08-02 NOTE — Patient Instructions (Signed)
INCREASE Torsemide 100mg  (1 tablet) Daily  Take a dose of Metolazone tomorrow. Then go back to twice weekly  Labs done today, your results will be available in MyChart, we will contact you for abnormal readings.  Your physician recommends that you schedule repeat labs in 10 days  Your physician recommends that you schedule a follow-up appointment in: 1-2 weeks  If you have any questions or concerns before your next appointment please send [place] a message through [place] or call our office at [phone].    TO LEAVE A MESSAGE FOR THE NURSE SELECT OPTION 2, PLEASE LEAVE A MESSAGE INCLUDING: . YOUR NAME . DATE OF BIRTH . CALL BACK NUMBER . REASON FOR CALL**this is important as we prioritize the call backs  YOU WILL RECEIVE A CALL BACK THE SAME DAY AS LONG AS YOU CALL BEFORE 4:00 PM

## 2020-08-02 NOTE — ED Notes (Signed)
Patient transported to CT via stretcher at this time.  

## 2020-08-02 NOTE — H&P (Signed)
History and Physical    PLEASE NOTE THAT DRAGON DICTATION SOFTWARE WAS USED IN THE CONSTRUCTION OF THIS NOTE.   Carl [name] KVQ:[id number] DOB: Apr 29, 1951 DOA: 08/02/2020  PCP: [name], MD Patient coming from: home   I have personally briefly reviewed patient's old medical records in [place]  Chief Complaint: Fall  HPI: Carl [name] is a 69 y.o. male with medical history significant for chronic systolic heart failure with most recent echocardiogram showing LVEF 20% status post AICD placement, paroxysmal atrial fibrillation chronically anticoagulated on Eliquis, stage IV chronic kidney disease with baseline creatinine 2.2-2.7, chronic blood loss anemia with baseline hemoglobin of 7-9, cirrhosis, who is admitted to [facility] on 08/02/2020 with acute on chronic blood loss anemia after presenting from home to [facility] Emergency Department for evaluation of ground-level mechanical fall.   Earlier today, ambulate to the car following dinner, the patient reports that he tripped and fell outside.  He did not believe that he hit his head as a component a loss of consciousness.  He reports that this was purely mechanical fall, noting that he tripped in the parking lot.  No preceding or subsequent chest pain.  He was encouraged by the party that he was approached the renal mechanical fall with and therefore presented to the emergency department for further evaluation.  Denies any headache, neck stiffness, vision, dizziness, or vertigo.  The patient reports that he is at his baseline is without complaint at this time.  He denies any shortness of breath, orthopnea, and worsening of his bilateral peripheral edema.  Denies any recent chest pain, palpitations, diaphoresis.  Denies any recent nausea or vomiting.  He acknowledges a history of chronic blood loss anemia in the setting of recurrent gastrointestinal bleeds, but has not recently noticed any melena or hematochezia.   He is chronically anticoagulated on Eliquis in the setting of paroxysmal atrial fibrillation.  Denies any use of NSAIDs.   He denies pain, diarrhea, or rash.  Dysuria, gross hematuria, or change in urinary urgency/frequency.  In the setting of recurrent gastrointestinal bleeds, it appears that the patient underwent colonoscopy via Dr. [name] on 07/23/2020, as well as preceding EGD on 06/28/2020 via Dr. Watt Climes.  In the setting, the patient reports that his baseline systolic blood pressures about 90 mmHg.   ED Course:  Vital signs in the ED were notable for the following: Temperature max 97.9; heart rate 62-67; initial blood pressure 76/40, which increased to baseline value of 90/53 following a small interval normal saline bolus, as further modified below; respiratory rate 18-20, and oxygen saturation 96 to 99% on room air.  Labs were notable for the following: CMP was notable for the following: Sodium 131, potassium 3.0, creatinine 2.91 relative to baseline range of 2.2-2.7, with most recent prior creatinine noted to be 2.26 when checked on 07/12/2020 CBC was notable for the following White blood cell count of 11,000 relative to most recent prior white blood cell count of 4400 on 07/02/2020.  Hemoglobin 6.8, relative to his baseline range of 7-9, with most recent prior hemoglobin noted to be 8.3.  Today's hemoglobin value associated with elevated RDW, and platelets were noted to be 263.  Serum magnesium level 2.3.  Screening nasopharyngeal COVID-19/influenza PCR checked in the ED this evening found to be negative.  Noncontrast CT of the head showed no evidence of acute intracranial process, including no evidence of intracranial hemorrhage.  CT of the cervical spine showed no evidence of acute fracture  or malalignment of the cervical spine.  While in the ED, the following were administered: Lactated Ringer bolus x200 cc.  Protonix drip initiated, and transfusion of 1 unit PRBC very slowly was ordered by the  emergency department physician.    Review of Systems: As per HPI otherwise 10 point review of systems negative.   Past Medical History:  Diagnosis Date  . AICD (automatic cardioverter/defibrillator) present   . Anemia   . Anxiety   . Arthritis   . Brunner's gland hyperplasia of duodenum   . CHF (congestive heart failure) ([place])   . Chronic kidney disease   . Cirrhosis of liver ([place])   . Coronary artery disease   . Depression   . Dysrhythmia    atrial fib  . External hemorrhoids   . Fracture of femoral neck, left ([place]) 07/21/2018  . GERD (gastroesophageal reflux disease)   . Gout   . Hearing loss   . Heart murmur    mirtal valve insufficiency  . Hepatic failure ([place])   . HH (hiatus hernia)   . Hyperlipidemia   . Hypokalemia   . Hyposmolality   . Intellectual disability    mild  . Internal hemorrhoids   . Ischemic cardiomyopathy   . Leg fracture, left   . Myocardial infarction ([place])   . Osteoporosis   . Paronychia   . Peripheral vascular disease ([place])   . Pneumonia   . Psoriasis   . Psoriasis   . PUD (peptic ulcer disease)   . Schatzki's ring   . Tubular adenoma of colon   . Vitiligo     Past Surgical History:  Procedure Laterality Date  . ATRIAL SEPTAL DEFECT(ASD) CLOSURE N/A 12/30/2018   Procedure: ATRIAL SEPTAL DEFECT(ASD) CLOSURE;  Surgeon: [name], MD;  Location: [place] [facility];  Service: Cardiovascular;  Laterality: N/A;  . CARDIAC SURGERY     ICD  . CARDIOVERSION N/A 11/09/2019   Procedure: CARDIOVERSION;  Surgeon: [name], MD;  Location: [facility] ENDOSCOPY;  Service: Cardiovascular;  Laterality: N/A;  . CARDIOVERSION N/A 12/21/2019   Procedure: CARDIOVERSION;  Surgeon: [name], MD;  Location: [place];  Service: Cardiovascular;  Laterality: N/A;  . CATARACT EXTRACTION W/PHACO Right 07/26/2020   Procedure: CATARACT EXTRACTION PHACO AND INTRAOCULAR LENS PLACEMENT ([place]) RIGHT VISION BLUE;  Surgeon: Birder Robson, MD;  Location:  [facility] ORS;  Service: Ophthalmology;  Laterality: Right;  [place] 01:14.1 CDE 13.65 Fluid Pack Lot # [id number]  . COLONOSCOPY WITH ESOPHAGOGASTRODUODENOSCOPY (EGD)    . COLONOSCOPY WITH PROPOFOL N/A 08/24/2017   Procedure: COLONOSCOPY WITH PROPOFOL;  Surgeon: [name], MD;  Location: [facility] ENDOSCOPY;  Service: Endoscopy;  Laterality: N/A;  . COLONOSCOPY WITH PROPOFOL N/A 07/23/2020   Procedure: COLONOSCOPY WITH PROPOFOL;  Surgeon: Tol[place], [name], MD;  Location: [facility] ENDOSCOPY;  Service: Gastroenterology;  Laterality: N/A;  . CORONARY ARTERY BYPASS GRAFT N/A 08/03/2014   Procedure: CORONARY ARTERY BYPASS GRAFTING (CABG);  Surgeon: [name], MD;  Location: [place];  Service: Open Heart Surgery;  Laterality: N/A;  . EP IMPLANTABLE DEVICE N/A 05/01/2015   MDT ICD implanted for primary prevention of sudden death  . ESOPHAGOGASTRODUODENOSCOPY N/A 06/28/2020   Procedure: ESOPHAGOGASTRODUODENOSCOPY (EGD);  Surgeon: Clarene Essex, MD;  Location: [place];  Service: Endoscopy;  Laterality: N/A;  . ESOPHAGOGASTRODUODENOSCOPY (EGD) WITH PROPOFOL N/A 04/16/2015   Procedure: ESOPHAGOGASTRODUODENOSCOPY (EGD) WITH PROPOFOL;  Surgeon: [name], MD;  Location: [facility] ENDOSCOPY;  Service: Endoscopy;  Laterality: N/A;  . ESOPHAGOGASTRODUODENOSCOPY (EGD)  WITH PROPOFOL N/A 08/24/2017   Procedure: ESOPHAGOGASTRODUODENOSCOPY (EGD) WITH PROPOFOL;  Surgeon: [name], MD;  Location: [facility] ENDOSCOPY;  Service: Endoscopy;  Laterality: N/A;  . FRACTURE SURGERY Left    ORIF lower leg  . HERNIA REPAIR    . HIP PINNING,CANNULATED Left 07/22/2018   Procedure: CANNULATED HIP PINNING;  Surgeon: [name], MD;  Location: [place];  Service: Orthopedics;  Laterality: Left;  . MITRAL VALVE REPAIR N/A 12/30/2018   Procedure: MITRAL VALVE REPAIR;  Surgeon: [name], MD;  Location: [place] [facility];  Service: Cardiovascular;  Laterality: N/A;  . RIGHT/LEFT HEART CATH AND CORONARY/GRAFT ANGIOGRAPHY N/A  12/21/2018   Procedure: RIGHT/LEFT HEART CATH AND CORONARY/GRAFT ANGIOGRAPHY;  Surgeon: [name], MD;  Location: [place] [facility];  Service: Cardiovascular;  Laterality: N/A;  . TEE WITHOUT CARDIOVERSION N/A 08/03/2014   Procedure: TRANSESOPHAGEAL ECHOCARDIOGRAM (TEE);  Surgeon: [name], MD;  Location: [place];  Service: Open Heart Surgery;  Laterality: N/A;  . TEE WITHOUT CARDIOVERSION N/A 02/10/2018   Procedure: TRANSESOPHAGEAL ECHOCARDIOGRAM (TEE);  Surgeon: [name], MD;  Location: [facility] ENDOSCOPY;  Service: Cardiovascular;  Laterality: N/A;  . TEE WITHOUT CARDIOVERSION N/A 11/09/2019   Procedure: TRANSESOPHAGEAL ECHOCARDIOGRAM (TEE);  Surgeon: [name], MD;  Location: [facility] ENDOSCOPY;  Service: Cardiovascular;  Laterality: N/A;    Social History:  reports that he quit smoking about 7 years ago. He has never used smokeless tobacco. He reports that he does not drink alcohol and does not use drugs.   Allergies  Allergen Reactions  . Spironolactone Other (See Comments)    gynecomastia     Family History  Problem Relation Age of Onset  . Valvular heart disease Mother        Ruptured valve  . CAD Father   . Heart Problems Brother        Stents x 4  . Diabetes Brother   . Prostate cancer Neg Hx   . Bladder Cancer Neg Hx   . Kidney cancer Neg Hx      Prior to Admission medications   Medication Sig Start Date End Date Taking? Authorizing Provider  acetaminophen (TYLENOL) 500 MG tablet Take 500-1,000 mg by mouth every 6 (six) hours as needed (for pain.).    [provider]  allopurinol (ZYLOPRIM) 100 MG tablet Take 100 mg by mouth daily.  02/08/19   [provider]  amiodarone (PACERONE) 200 MG tablet Take 1 tablet (200 mg total) by mouth daily. 04/13/20   Bensimhon, Shaune Pascal, MD  Ascorbic Acid (VITAMIN C) 100 MG tablet Take 100 mg by mouth daily.    [provider]  atorvastatin (LIPITOR) 40 MG tablet Take 40 mg by mouth every evening.     [provider]  carvedilol (COREG) 3.125 MG tablet Take 1 tablet (3.125 mg total) by mouth 2 (two) times daily. 05/21/20   Consuelo Pandy, PA-C  Cholecalciferol 50 MCG (2000 UT) TABS Take 2,000 Units by mouth at bedtime.     [provider]  cyanocobalamin 500 MCG tablet Take 500 mcg by mouth daily.     [provider]  docusate sodium (COLACE) 100 MG capsule Take 100 mg by mouth daily as needed for mild constipation.    [provider]  DULoxetine (CYMBALTA) 30 MG capsule Take 30 mg by mouth at bedtime.     [provider]  ELIQUIS 5 MG TABS tablet Take 5 mg by mouth 2 (two) times daily. 07/14/20  [provider]  ferrous sulfate 325 (65 FE) MG tablet Take 1 tablet (325 mg total) by mouth daily with breakfast. 02/09/18   [name], MD  gabapentin (NEURONTIN) 300 MG capsule Take 300 mg by mouth at bedtime.     [provider]  ketoconazole (NIZORAL) 2 % shampoo Apply 1 application topically 2 (two) times a week. Mon and Thu 09/28/18   [provider]  lactulose (CHRONULAC) 10 GM/15ML solution Take 30 mLs (20 g total) by mouth daily. 07/05/20   Gaylan Gerold, DO  metolazone (ZAROXOLYN) 2.5 MG tablet Take 1 tablet twice a week, every Monday and Thursday. Take 20 mEq Potassium with Metolazone. 07/30/20   [name], MD  Multiple Vitamin (MULTIVITAMIN) tablet Take 1 tablet by mouth at bedtime.     [provider]  pantoprazole (PROTONIX) 40 MG tablet TAKE 1 TABLET BY MOUTH EVERY DAY Patient taking differently: Take 40 mg by mouth daily. 12/08/19   [name], MD  potassium chloride SA (KLOR-CON M20) 20 MEQ tablet Take 1 tablet (20 mEq total) by mouth daily. Patient taking differently: Take 20 mEq by mouth daily. He is taking an extra 20 meq with metolazone 06/06/20   [name], MD  sucralfate (CARAFATE) 1 g tablet Take 1 tablet (1 g total) by mouth 2 (two) times daily. 07/05/20   Gaylan Gerold,  DO  torsemide (DEMADEX) 100 MG tablet Take 1 tablet (100 mg total) by mouth daily. 08/02/20   Consuelo Pandy, PA-C     Objective    Physical Exam: Vitals:   08/02/20 1912 08/02/20 1915 08/02/20 2010 08/02/20 2123  BP: (!) 76/40  (!) 81/56 (!) 88/57  Pulse: 61  61 63  Resp: 20  (!) 24 18  Temp: 97.7 F (36.5 C)  97.9 F (36.6 C)   TempSrc: Oral  Axillary   SpO2:  96% 96% 95%  Weight:  72.6 kg    Height:  6\' 1"  (1.854 m)      General: appears to be stated age; alert, oriented Skin: warm, dry, no rash Head:  AT/[place] Mouth:  Oral mucosa membranes appear dry, normal dentition Neck: supple; trachea midline Heart:  RRR; did not appreciate any M/R/G Lungs: Diminished bibasilar breath sounds, but otherwise CTAB,  Abdomen: + BS; soft, ND, NT Vascular: 2+ pedal pulses b/l; 2+ radial pulses b/l Extremities: no peripheral edema, no muscle wasting Neuro: strength and sensation intact in upper and lower extremities b/l  Labs on Admission: I have personally reviewed following labs and imaging studies  CBC: Recent Labs  Lab 07/27/20 1309 08/02/20 2116  WBC 6.0 11.0*  NEUTROABS 4.2 9.2*  HGB 8.3* 6.8*  HCT 25.4* 21.2*  MCV 95.1 100.0  PLT 267 329   Basic Metabolic Panel: Recent Labs  Lab 08/02/20 1424 08/02/20 2116  NA 130* 131*  K 2.6* 3.0*  CL 86* 87*  CO2 32 30  GLUCOSE 129* 125*  BUN 72* 82*  CREATININE 2.91* 2.91*  CALCIUM 9.1 8.8*   GFR: Estimated Creatinine Clearance: 24.6 mL/min (A) (by C-G formula based on SCr of 2.91 mg/dL (H)). Liver Function Tests: No results for input(s): AST, ALT, ALKPHOS, BILITOT, PROT, ALBUMIN in the last 168 hours. No results for input(s): LIPASE, AMYLASE in the last 168 hours. No results for input(s): AMMONIA in the last 168 hours. Coagulation Profile: No results for input(s): INR, PROTIME in the last 168 hours. Cardiac Enzymes: No results for input(s): CKTOTAL, CKMB, CKMBINDEX, TROPONINI in  the last 168 hours. BNP (last 3  results) No results for input(s): PROBNP in the last 8760 hours. HbA1C: No results for input(s): HGBA1C in the last 72 hours. CBG: No results for input(s): GLUCAP in the last 168 hours. Lipid Profile: No results for input(s): CHOL, HDL, LDLCALC, TRIG, CHOLHDL, LDLDIRECT in the last 72 hours. Thyroid Function Tests: No results for input(s): TSH, T4TOTAL, FREET4, T3FREE, THYROIDAB in the last 72 hours. Anemia Panel: No results for input(s): VITAMINB12, FOLATE, FERRITIN, TIBC, IRON, RETICCTPCT in the last 72 hours. Urine analysis:    Component Value Date/Time   COLORURINE YELLOW (A) 04/14/2019 1826   APPEARANCEUR CLEAR (A) 04/14/2019 1826   APPEARANCEUR Clear 04/28/2017 1259   LABSPEC 1.005 04/14/2019 1826   PHURINE 6.0 04/14/2019 1826   GLUCOSEU NEGATIVE 04/14/2019 1826   HGBUR NEGATIVE 04/14/2019 1826   BILIRUBINUR NEGATIVE 04/14/2019 1826   BILIRUBINUR Negative 04/28/2017 1259   KETONESUR NEGATIVE 04/14/2019 1826   PROTEINUR NEGATIVE 04/14/2019 1826   NITRITE NEGATIVE 04/14/2019 1826   LEUKOCYTESUR NEGATIVE 04/14/2019 1826    Radiological Exams on Admission: DG Lumbar Spine 2-3 Views  Result Date: 08/02/2020 CLINICAL DATA:  Fall EXAM: LUMBAR SPINE - 2-3 VIEW COMPARISON:  CT 06/20/2019 FINDINGS: Lumbar alignment within normal limits. Vertebral body heights are maintained. Mild degenerative changes at L2-L3. Facet degenerative changes of the lower lumbar spine. Aortic atherosclerosis. IMPRESSION: No acute osseous abnormality. Electronically Signed   By: Donavan Foil M.D.   On: 08/02/2020 21:10   CT Head Wo Contrast  Result Date: 08/02/2020 CLINICAL DATA:  Fall EXAM: CT HEAD WITHOUT CONTRAST TECHNIQUE: Contiguous axial images were obtained from the base of the skull through the vertex without intravenous contrast. COMPARISON:  October 16, 2018 FINDINGS: Brain: No evidence of acute territorial infarction, hemorrhage, hydrocephalus,extra-axial collection or mass lesion/mass effect.  There is dilatation the ventricles and sulci consistent with age-related atrophy. Low-attenuation changes in the deep white matter consistent with small vessel ischemia. Vascular: No hyperdense vessel or unexpected calcification. Skull: The skull is intact. No fracture or focal lesion identified. Sinuses/Orbits: Small amount of fluid seen within the sphenoid sinuses. The orbits and globes intact. Other: None Cervical spine: Alignment: There is a grade 1 anterolisthesis of C4-C5 measuring 3 mm. Skull base and vertebrae: Visualized skull base is intact. No atlanto-occipital dissociation. The vertebral body heights are well maintained. No fracture or pathologic osseous lesion seen. Soft tissues and spinal canal: The visualized paraspinal soft tissues are unremarkable. No prevertebral soft tissue swelling is seen. The spinal canal is grossly unremarkable, no large epidural collection or significant canal narrowing. Disc levels: Cervical spine spondylosis is seen disc osteophyte uncovertebral osteophytes most notable C3-C4 with severe left neural foraminal narrowing and central canal stenosis. Upper chest: Biapical scarring is seen. Thoracic inlet is within normal limits. Other: None IMPRESSION: No acute intracranial abnormality. Findings consistent with age related atrophy and chronic small vessel ischemia No acute fracture or malalignment of the spine. Stable grade 1 anterolisthesis C4 on C5. Cervical spine spondylosis most notable at C3-C4. Electronically Signed   By: Prudencio Pair M.D.   On: 08/02/2020 21:06   CT Cervical Spine Wo Contrast  Result Date: 08/02/2020 CLINICAL DATA:  Fall EXAM: CT HEAD WITHOUT CONTRAST TECHNIQUE: Contiguous axial images were obtained from the base of the skull through the vertex without intravenous contrast. COMPARISON:  October 16, 2018 FINDINGS: Brain: No evidence of acute territorial infarction, hemorrhage, hydrocephalus,extra-axial collection or mass lesion/mass effect. There is  dilatation the ventricles and sulci  consistent with age-related atrophy. Low-attenuation changes in the deep white matter consistent with small vessel ischemia. Vascular: No hyperdense vessel or unexpected calcification. Skull: The skull is intact. No fracture or focal lesion identified. Sinuses/Orbits: Small amount of fluid seen within the sphenoid sinuses. The orbits and globes intact. Other: None Cervical spine: Alignment: There is a grade 1 anterolisthesis of C4-C5 measuring 3 mm. Skull base and vertebrae: Visualized skull base is intact. No atlanto-occipital dissociation. The vertebral body heights are well maintained. No fracture or pathologic osseous lesion seen. Soft tissues and spinal canal: The visualized paraspinal soft tissues are unremarkable. No prevertebral soft tissue swelling is seen. The spinal canal is grossly unremarkable, no large epidural collection or significant canal narrowing. Disc levels: Cervical spine spondylosis is seen disc osteophyte uncovertebral osteophytes most notable C3-C4 with severe left neural foraminal narrowing and central canal stenosis. Upper chest: Biapical scarring is seen. Thoracic inlet is within normal limits. Other: None IMPRESSION: No acute intracranial abnormality. Findings consistent with age related atrophy and chronic small vessel ischemia No acute fracture or malalignment of the spine. Stable grade 1 anterolisthesis C4 on C5. Cervical spine spondylosis most notable at C3-C4. Electronically Signed   By: Prudencio Pair M.D.   On: 08/02/2020 21:06    Assessment/Plan   Carl [name] is a 69 y.o. male with medical history significant for chronic systolic heart failure with most recent echocardiogram showing LVEF 20% status post AICD placement, paroxysmal atrial fibrillation chronically anticoagulated on Eliquis, stage IV chronic kidney disease with baseline creatinine 2.2-2.7, chronic blood loss anemia with baseline hemoglobin of 7-9, cirrhosis, who is admitted  to [facility] on 08/02/2020 with acute on chronic blood loss anemia after presenting from home to [facility] Emergency Department for evaluation of ground-level mechanical fall.     Principal Problem:   Acute on chronic blood loss anemia Active Problems:   GERD (gastroesophageal reflux disease)   Hypokalemia   Chronic systolic CHF (congestive heart failure) (HCC)   Cirrhosis (HCC)   Acute kidney injury superimposed on CKD (HCC)   Leukocytosis    #) Acute on chronic blood loss anemia in the setting of acute on chronic GI bleed: Relative to baseline hemoglobin range of 7.9 the patient is noted to possess hemoglobin of 6.8 presentation, with rectal exam revealing occult stool of dark coloration.  There is some elevation in BUN relative to prior to the possibility of an acute upper gastrointestinal bleed.  However, the patient reports that he is completely asymptomatic, and denies any associated chest pain, dizziness, shortness of breath.  While he has been nontachycardic, with baseline blood pressures following a small IV fluid bolus, further described above.  Given the asymptomatic nature of the patient as well as the appearance of hemodynamic stability, that if there is an acute upper gastrointestinal bleed, that it is very slow in nature.  As the patient recently underwent, as further outlined above, attempt additional chart review to gain further insight into the observations from the studies.  1 unit PRBC transfusion was ordered by the emergency department physician.   Plan: Serial H&H through 10 AM tomorrow.  Monitor on telemetry.  Monitor continuous pulse oximetry.  Check of placed an order with the blood bank requesting that the screen, and hold 2 units PRBC.  Continue Protonix drip initiated in the ED.  As the patient has a documented history of cirrhosis with potential presenting upper gastrointestinal bleed, will initiate SBP prophylaxis in the form of ciprofloxacin.  Does  not appear suggestive of variceal bleed, and therefore will not initiate octreotide at this time.  Will warrant gastroenterology consult in the morning.  Holding home dose of Eliquis this evening.  Add on iron studies to initial labs collected in the ED this evening to patient would benefit from any IV iron infusion.     #) Acute kidney injury superimposed on stage IV chronic kidney disease: In the context of baseline creatinine of 2.2-2.7, presenting labs reflect serum creatinine that is slightly elevated outside of this range, at 2.91.  Suspect that this is on the basis of development of diminished oxygen carrying capacity as well.  Capacity given on chronic anemia, as further outlined above.   Plan: Check urinalysis with microscopic evaluation work-up and management loss anemia, as further described above.  Monitor strict I's and daily weights.  Attempt to avoid nephrotoxic agents.     #) Leukocytosis: CBC is 11,000.  Suspect that this is inflammatory in the setting of ground-level mechanical fall this evening as opposed to being representative of an underlying infectious process.  Chest x-ray showed no evidence of underlying infection, while Covid 19/influenza PCR performed in the ED this evening were found to be negative.  We will further expand infectious work-up by checking urinalysis as well.  Plan: Check urinalysis.  Repeat CBC with differential in the morning.     #) Chronic systolic heart failure: with most recent echocardiogram showing evidence of left ventricular ejection fraction of 20%, status post AICD.  He is on torsemide 100 mg p.o. daily.  In addition to Coreg.  Clinically, and symptoms there does not appear to be strong evidence to suggest acutely decompensated heart failure, although the patient certainly is at baseline given the severity of systolic dysfunction, with increased risk of presenting acute on chronic anemia.  Plan: Monitor strict I's and O's and daily weights.   Continue torsemide.  Continue current.     #) Paroxysmal atrial fibrillation: Chronically anticoagulated on Eliquis in the setting of CHA2DS2-VASc score patient for chronic thromboembolic prophylaxis on this basis.  On Pacerone as an outpatient.  Plan: Monitor on telemetry given risk for going into RVR due to physiologic stressors stemming from acute on chronic anemia, as above.  Add on serum magnesium level.  Repeat BMP in the morning.  Holding home dose of presenting acute on chronic anemia.  Continue home [place].      #) Cirrhosis: Given the extent of the patient's chronic systolic dysfunction heart failure.  Unable to calculate a meld score at this time, as INR value is currently pending.  A documented history of chronic liver disease with potential presenting acute upper gastrointestinal bleed SBP prophylaxis in the form of ciprofloxacin.  Plan: Ciprofloxacin for SBP prophylaxis, as above.  Check INR.  Repeat CMP in the morning.  Approach to diuretic regimen, as further described above.    DVT prophylaxis: SCDs  Code Status: DNR Family Communication: none Disposition Plan: Per Rounding Team Consults called: none  Admission status: Inpatient; PCU.    Of note, this patient was added by me to the following Admit List/Treatment Team:  armcadmits     PLEASE NOTE THAT DRAGON DICTATION SOFTWARE WAS USED IN THE CONSTRUCTION OF THIS NOTE.   [facility] Pager [phone] From 12PM- 12AM  Otherwise, please contact night-coverage  [email or web address] Password [facility]  08/02/2020, 10:23 PM
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
## 2020-08-02 NOTE — Progress Notes (Signed)
This patient returns to my office for at risk foot care.  This patient requires this care by a professional since this patient will be at risk due to having coagulation defect.   This patient is unable to cut nails himself since the patient cannot reach his nails.These nails are painful walking and wearing shoes.  This patient presents for at risk foot care today.  General Appearance  Alert, conversant and in no acute stress.  Vascular  Dorsalis pedis and posterior tibial  pulses are not  palpable  bilaterally.  Capillary return is within normal limits  bilaterally. Temperature is within normal limits  Bilaterally. Swelling  B/L.  Neurologic  Senn-Weinstein monofilament wire test within normal limits  bilaterally. Muscle power within normal limits bilaterally.  Nails Thick disfigured discolored nails with subungual debris  from hallux to fifth toes bilaterally. No evidence of bacterial infection or drainage bilaterally.  Orthopedic  No limitations of motion  feet .  No crepitus or effusions noted.  No bony pathology or digital deformities noted.   Skin  normotropic skin with no porokeratosis noted bilaterally.  No signs of infections or ulcers noted.     Onychomycosis  Pain in right toes  Pain in left toes  Consent was obtained for treatment procedures.   Mechanical debridement of nails 1-5  bilaterally performed with a nail nipper.  Filed with dremel without incident. Patient is wearing his new shoes today.  His caregiver says the foot rocks in his shoes.  Told him to wear second pair of soaks.  Foot can be swollen.   Return office visit   3 months                  Told patient to return for periodic foot care and evaluation due to potential at risk complications.   Gardiner Barefoot DPM

## 2020-08-02 NOTE — Telephone Encounter (Signed)
-----   Message from Consuelo Pandy, [place] sent at 08/02/2020  4:33 PM EST ----- K low at 2.6. Take 40 mEq of KCl now. Hold metolazone and torsemide tomorrow morning. Come in for repeat BMP tomorrow to recheck K and also order a Mg level.

## 2020-08-02 NOTE — ED Provider Notes (Signed)
[facility] Emergency Department Provider Note   ____________________________________________   Event Date/Time   First MD Initiated Contact with Patient 08/02/20 1924     (approximate)  I have reviewed the triage vital signs and the nursing notes.   HISTORY  Chief Complaint Fall    HPI Carl [name] is a 69 y.o. male with past medical history of hypertension, hyperlipidemia, CAD status post CABG, CHF (EF 20%), CKD, and atrial fibrillation on Eliquis who presents to the ED following fall.  78 of history is obtained from patient's brother, who states that patient was returning home from dinner when he slipped on the front steps and fell backwards.  He struck the back of his head on the ground but did not lose consciousness.  He had a hard time getting up off of the ground and complained of lower back pain.  He has been acting like his usual self since then and denies any numbness or weakness.  He has had issues with fluid overload recently and required adjustment of his medications at his cardiologist office earlier today.  EMS noted patient's blood pressure was low, but this is apparently a chronic issue for the patient.  He denies any lightheadedness, syncope, chest pain, or shortness of breath.        Past Medical History:  Diagnosis Date  . AICD (automatic cardioverter/defibrillator) present   . Anemia   . Anxiety   . Arthritis   . Brunner's gland hyperplasia of duodenum   . CHF (congestive heart failure) ([place])   . Chronic kidney disease   . Cirrhosis of liver ([place])   . Coronary artery disease   . Depression   . Dysrhythmia    atrial fib  . External hemorrhoids   . Fracture of femoral neck, left ([place]) 07/21/2018  . GERD (gastroesophageal reflux disease)   . Gout   . Hearing loss   . Heart murmur    mirtal valve insufficiency  . Hepatic failure ([place])   . HH (hiatus hernia)   . Hyperlipidemia   . Hypokalemia   . Hyposmolality   .  Intellectual disability    mild  . Internal hemorrhoids   . Ischemic cardiomyopathy   . Leg fracture, left   . Myocardial infarction ([place])   . Osteoporosis   . Paronychia   . Peripheral vascular disease ([place])   . Pneumonia   . Psoriasis   . Psoriasis   . PUD (peptic ulcer disease)   . Schatzki's ring   . Tubular adenoma of colon   . Vitiligo     Patient Active Problem List   Diagnosis Date Noted  . Pressure injury of skin 06/29/2020  . Hyponatremia 06/27/2020  . GI bleed 06/27/2020  . 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 Persistent atrial fibrillation ([place]) 11/30/2019  . Secondary hypercoagulable state ([place]) 11/30/2019  . Arm DVT (deep venous thromboembolism), acute, left ([place]) 11/26/2019  . Hx of adenomatous colonic polyps 05/18/2019  . Cirrhosis ([place]) 05/12/2019  . Occult blood positive stool 05/12/2019  . HCAP (healthcare-associated pneumonia) 03/04/2019  . Hallucinations 02/10/2019  . Elevated blood sugar 01/27/2019  . S/P mitral valve repair 12/31/2018  . Bilateral pulmonary contusion 10/17/2018  . Cardiac LV ejection fraction of 20-34% 10/17/2018  . Chronic systolic CHF (congestive heart failure) ([place]) 10/17/2018  . History of ischemic cardiomyopathy 10/17/2018  . ICD (implantable cardioverter-defibrillator) in place 10/17/2018  . S/p left hip fracture 10/17/2018  . Traumatic retroperitoneal hematoma 10/17/2018  .  Age-related osteoporosis with current pathological fracture with routine healing 07/28/2018  . CAD (coronary artery disease) 07/21/2018  . GERD (gastroesophageal reflux disease) 07/21/2018  . Depression 07/21/2018  . CKD (chronic kidney disease), stage III ([place]) 07/21/2018  . Iron deficiency anemia 07/21/2018  . Hypokalemia 07/21/2018  . Closed left hip fracture ([place]) 07/21/2018  . Fracture of femoral neck, left ([place]) 07/21/2018  . Lower extremity pain, bilateral 11/12/2017  . 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  Hyperlipidemia 09/01/2017  . Essential hypertension 09/01/2017  . PAD (peripheral artery disease) ([place]) 09/01/2017  . Symptomatic anemia 08/06/2017  . GAD (generalized anxiety disorder) 06/18/2017  . Prostate cancer screening 04/28/2017  . Hydrocele 04/28/2017  . Bradycardia   . Premature ventricular contraction   . Severe mitral insufficiency   . Cardiomyopathy, ischemic 04/26/2015  . Difficulty hearing 12/29/2014  . Acute on chronic systolic CHF (congestive heart failure) ([place]) 10/10/2014  . 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 Protein-calorie malnutrition, severe ([place]) 07/29/2014  . AKI (acute kidney injury) ([place]) 07/29/2014    Past Surgical History:  Procedure Laterality Date  . ATRIAL SEPTAL DEFECT(ASD) CLOSURE N/A 12/30/2018   Procedure: ATRIAL SEPTAL DEFECT(ASD) CLOSURE;  Surgeon: [name], MD;  Location: [place] CV LAB;  Service: Cardiovascular;  Laterality: N/A;  . CARDIAC SURGERY     ICD  . CARDIOVERSION N/A 11/09/2019   Procedure: CARDIOVERSION;  Surgeon: [name], MD;  Location: [facility] ENDOSCOPY;  Service: Cardiovascular;  Laterality: N/A;  . CARDIOVERSION N/A 12/21/2019   Procedure: CARDIOVERSION;  Surgeon: [name], MD;  Location: [place];  Service: Cardiovascular;  Laterality: N/A;  . CATARACT EXTRACTION W/PHACO Right 07/26/2020   Procedure: CATARACT EXTRACTION PHACO AND INTRAOCULAR LENS PLACEMENT ([place]) RIGHT VISION BLUE;  Surgeon: Birder Robson, MD;  Location: [facility] ORS;  Service: Ophthalmology;  Laterality: Right;  [place] 01:14.1 CDE 13.65 Fluid Pack Lot # [id number]  . COLONOSCOPY WITH ESOPHAGOGASTRODUODENOSCOPY (EGD)    . COLONOSCOPY WITH PROPOFOL N/A 08/24/2017   Procedure: COLONOSCOPY WITH PROPOFOL;  Surgeon: [name], MD;  Location: [facility] ENDOSCOPY;  Service: Endoscopy;  Laterality: N/A;  . COLONOSCOPY WITH PROPOFOL N/A 07/23/2020   Procedure: COLONOSCOPY WITH PROPOFOL;  Surgeon: Tol[place], [name], MD;  Location: [facility] ENDOSCOPY;  Service:  Gastroenterology;  Laterality: N/A;  . CORONARY ARTERY BYPASS GRAFT N/A 08/03/2014   Procedure: CORONARY ARTERY BYPASS GRAFTING (CABG);  Surgeon: [name], MD;  Location: [place];  Service: Open Heart Surgery;  Laterality: N/A;  . EP IMPLANTABLE DEVICE N/A 05/01/2015   MDT ICD implanted for primary prevention of sudden death  . ESOPHAGOGASTRODUODENOSCOPY N/A 06/28/2020   Procedure: ESOPHAGOGASTRODUODENOSCOPY (EGD);  Surgeon: Clarene Essex, MD;  Location: [place];  Service: Endoscopy;  Laterality: N/A;  . ESOPHAGOGASTRODUODENOSCOPY (EGD) WITH PROPOFOL N/A 04/16/2015   Procedure: ESOPHAGOGASTRODUODENOSCOPY (EGD) WITH PROPOFOL;  Surgeon: [name], MD;  Location: [facility] ENDOSCOPY;  Service: Endoscopy;  Laterality: N/A;  . ESOPHAGOGASTRODUODENOSCOPY (EGD) WITH PROPOFOL N/A 08/24/2017   Procedure: ESOPHAGOGASTRODUODENOSCOPY (EGD) WITH PROPOFOL;  Surgeon: [name], MD;  Location: [facility] ENDOSCOPY;  Service: Endoscopy;  Laterality: N/A;  . FRACTURE SURGERY Left    ORIF lower leg  . HERNIA REPAIR    . HIP PINNING,CANNULATED Left 07/22/2018   Procedure: CANNULATED HIP PINNING;  Surgeon: [name], MD;  Location: [place];  Service: Orthopedics;  Laterality: Left;  . MITRAL VALVE REPAIR N/A 12/30/2018   Procedure: MITRAL VALVE REPAIR;  Surgeon: [name], MD;  Location: [place] CV  LAB;  Service: Cardiovascular;  Laterality: N/A;  . RIGHT/LEFT HEART CATH AND CORONARY/GRAFT ANGIOGRAPHY N/A 12/21/2018   Procedure: RIGHT/LEFT HEART CATH AND CORONARY/GRAFT ANGIOGRAPHY;  Surgeon: [name], MD;  Location: [place] CV LAB;  Service: Cardiovascular;  Laterality: N/A;  . TEE WITHOUT CARDIOVERSION N/A 08/03/2014   Procedure: TRANSESOPHAGEAL ECHOCARDIOGRAM (TEE);  Surgeon: [name], MD;  Location: [place];  Service: Open Heart Surgery;  Laterality: N/A;  . TEE WITHOUT CARDIOVERSION N/A 02/10/2018   Procedure: TRANSESOPHAGEAL ECHOCARDIOGRAM (TEE);  Surgeon: [name],  MD;  Location: [facility] ENDOSCOPY;  Service: Cardiovascular;  Laterality: N/A;  . TEE WITHOUT CARDIOVERSION N/A 11/09/2019   Procedure: TRANSESOPHAGEAL ECHOCARDIOGRAM (TEE);  Surgeon: [name], MD;  Location: [facility] ENDOSCOPY;  Service: Cardiovascular;  Laterality: N/A;    Prior to Admission medications   Medication Sig Start Date End Date Taking? Authorizing Provider  acetaminophen (TYLENOL) 500 MG tablet Take 500-1,000 mg by mouth every 6 (six) hours as needed (for pain.).    [provider]  allopurinol (ZYLOPRIM) 100 MG tablet Take 100 mg by mouth daily.  02/08/19   [provider]  amiodarone (PACERONE) 200 MG tablet Take 1 tablet (200 mg total) by mouth daily. 04/13/20   Bensimhon, Shaune Pascal, MD  Ascorbic Acid (VITAMIN C) 100 MG tablet Take 100 mg by mouth daily.    [provider]  atorvastatin (LIPITOR) 40 MG tablet Take 40 mg by mouth every evening.    [provider]  carvedilol (COREG) 3.125 MG tablet Take 1 tablet (3.125 mg total) by mouth 2 (two) times daily. 05/21/20   Consuelo Pandy, PA-C  Cholecalciferol 50 MCG (2000 UT) TABS Take 2,000 Units by mouth at bedtime.     [provider]  cyanocobalamin 500 MCG tablet Take 500 mcg by mouth daily.     [provider]  docusate sodium (COLACE) 100 MG capsule Take 100 mg by mouth daily as needed for mild constipation.    [provider]  DULoxetine (CYMBALTA) 30 MG capsule Take 30 mg by mouth at bedtime.     [provider]  ELIQUIS 5 MG TABS tablet Take 5 mg by mouth 2 (two) times daily. 07/14/20   [provider]  ferrous sulfate 325 (65 FE) MG tablet Take 1 tablet (325 mg total) by mouth daily with breakfast. 02/09/18   [name], MD  gabapentin (NEURONTIN) 300 MG capsule Take 300 mg by mouth at bedtime.     [provider]  ketoconazole (NIZORAL) 2 % shampoo Apply 1 application topically 2 (two) times a week. Mon and Thu 09/28/18   [provider]  lactulose (CHRONULAC) 10 GM/15ML solution Take 30 mLs (20 g total) by mouth daily. 07/05/20   Gaylan Gerold, DO  metolazone (ZAROXOLYN) 2.5 MG tablet Take 1 tablet twice a week, every Monday and Thursday. Take 20 mEq Potassium with Metolazone. 07/30/20   [name], MD  Multiple Vitamin (MULTIVITAMIN) tablet Take 1 tablet by mouth at bedtime.     [provider]  pantoprazole (PROTONIX) 40 MG tablet TAKE 1 TABLET BY MOUTH EVERY DAY Patient taking differently: Take 40 mg by mouth daily. 12/08/19   [name], MD  potassium chloride SA (KLOR-CON M20) 20 MEQ tablet Take 1 tablet (20 mEq total) by mouth daily. Patient taking differently: Take 20 mEq by mouth daily. He is taking an extra 20 meq with metolazone 06/06/20   [name], MD  sucralfate ([place])  1 g tablet Take 1 tablet (1 g total) by mouth 2 (two) times daily. 07/05/20   Gaylan Gerold, DO  torsemide (DEMADEX) 100 MG tablet Take 1 tablet (100 mg total) by mouth daily. 08/02/20   Consuelo Pandy, PA-C    Allergies Spironolactone  Family History  Problem Relation Age of Onset  . Valvular heart disease Mother        Ruptured valve  . CAD Father   . Heart Problems Brother        Stents x 4  . Diabetes Brother   . Prostate cancer Neg Hx   . Bladder Cancer Neg Hx   . Kidney cancer Neg Hx     Social History Social History   Tobacco Use  . Smoking status: Former Smoker    Quit date: 2014    Years since quitting: 7.9  . Smokeless tobacco: Never Used  . Tobacco comment: 07/30/2017 Quit in 2014  Vaping Use  . Vaping Use: Never used  Substance Use Topics  . Alcohol use: No    Alcohol/week: 0.0 standard drinks  . Drug use: No    Review of Systems  Constitutional: No fever/chills Eyes: No visual changes. ENT: No sore throat. Cardiovascular: Denies chest pain. Respiratory: Denies shortness of breath. Gastrointestinal: No abdominal pain.  No nausea, no vomiting.  No diarrhea.  No  constipation. Genitourinary: Negative for dysuria. Musculoskeletal: Positive for for back pain. Skin: Negative for rash. Neurological: Negative for headaches, focal weakness or numbness.  ____________________________________________   PHYSICAL EXAM:  VITAL SIGNS: ED Triage Vitals  Enc Vitals Group     BP 08/02/20 1912 (!) 76/40     Pulse Rate 08/02/20 1912 61     Resp 08/02/20 1912 20     Temp 08/02/20 1912 97.7 F (36.5 C)     Temp Source 08/02/20 1912 Oral     SpO2 08/02/20 1915 96 %     Weight 08/02/20 1915 160 lb (72.6 kg)     Height 08/02/20 1915 6\' 1"  (1.854 m)     Head Circumference --      Peak Flow --      Pain Score --      Pain Loc --      Pain Edu? --      Excl. in [place]? --     Constitutional: Alert and oriented. Eyes: Conjunctivae are normal. Head: Atraumatic. Nose: No congestion/rhinnorhea. Mouth/Throat: Mucous membranes are moist. Neck: Normal ROM Cardiovascular: Normal rate, regular rhythm. Grossly normal heart sounds.  2+ radial pulses bilaterally. Respiratory: Normal respiratory effort.  No retractions. Lungs CTAB. Gastrointestinal: Soft and nontender. No distention. Genitourinary: deferred Musculoskeletal: No lower extremity tenderness nor edema.  Midline lumbar spinal tenderness noted.  No upper extremity bony tenderness. Neurologic:  Normal speech and language. No gross focal neurologic deficits are appreciated. Skin:  Skin is warm, dry and intact. No rash noted. Psychiatric: Mood and affect are normal. Speech and behavior are normal.  ____________________________________________   LABS (all labs ordered are listed, but only abnormal results are displayed)  Labs Reviewed  CBC WITH DIFFERENTIAL/PLATELET - Abnormal; Notable for the following components:      Result Value   WBC 11.0 (*)    RBC 2.12 (*)    Hemoglobin 6.8 (*)    HCT 21.2 (*)    RDW 21.4 (*)    nRBC 0.8 (*)    Neutro Abs 9.2 (*)    Lymphs Abs 0.6 (*)    Abs Immature  Granulocytes 0.14 (*)    All other components within normal limits  BASIC METABOLIC PANEL - Abnormal; Notable for the following components:   Sodium 131 (*)    Potassium 3.0 (*)    Chloride 87 (*)    Glucose, Bld 125 (*)    BUN 82 (*)    Creatinine, Ser 2.91 (*)    Calcium 8.8 (*)    GFR, Estimated 23 (*)    All other components within normal limits  RESP PANEL BY RT-PCR (FLU A&B, COVID) ARPGX2  MAGNESIUM  HEPATIC FUNCTION PANEL  PREPARE RBC (CROSSMATCH)  TYPE AND SCREEN  TROPONIN I (HIGH SENSITIVITY)   ____________________________________________  EKG  ED ECG REPORT I, Blake Divine, the attending physician, personally viewed and interpreted this ECG.   Date: 08/02/2020  EKG Time: 20:19  Rate: 63  Rhythm: normal sinus rhythm  Axis: LAD  Intervals:right bundle branch block  ST&T Change: None   PROCEDURES  Procedure(s) performed (including Critical Care):  .Critical Care Performed by: Blake Divine, MD Authorized by: Blake Divine, MD   Critical care provider statement:    Critical care time (minutes):  45   Critical care time was exclusive of:  Separately billable procedures and treating other patients and teaching time   Critical care was necessary to treat or prevent imminent or life-threatening deterioration of the following conditions:  Circulatory failure   Critical care was time spent personally by me on the following activities:  Discussions with consultants, evaluation of patient's response to treatment, examination of patient, ordering and performing treatments and interventions, ordering and review of laboratory studies, ordering and review of radiographic studies, pulse oximetry, re-evaluation of patient's condition, obtaining history from patient or surrogate and review of old charts   I assumed direction of critical care for this patient from another provider in my specialty: no       ____________________________________________   INITIAL  IMPRESSION / ASSESSMENT AND PLAN / ED COURSE       69 year old male with past medical history of hypertension, hyperlipidemia, CAD status post CABG, CHF (EF 20%), CKD, and atrial fibrillation on Eliquis who presents to the ED following fall where he tripped and fell backwards, striking his head.  He has abrasions to his scalp and we will further assess with CT head and C-spine.  He also complains of pain in his lower back, which we will further assess with x-ray.  No focal neurologic deficits noted on exam.  Patient noted to be hypotensive but brother at bedside states this is a chronic issue for him and his blood pressure often runs around 05-397 systolic.  Current BP is slightly lower than this, we will check basic labs and hydrate with small bolus of IV fluids given patient's chronic systolic heart failure.  EKG shows sinus rhythm with no ischemic changes.  Patient's blood pressure gradually improving following small IV fluid bolus.  Further review of rhythm strip is concerning for junctional bradycardia.  This was discussed with Dr. Caryl Comes of cardiology, who reviewed EKG and states findings could represent sinus bradycardia or junctional bradycardia.  Either way, with a heart rate in the 60s it is unlikely to be the source of his hypotension.  Patient's blood work is remarkable for a hemoglobin of 6.8, rectal exam shows guaiac positive melanotic stool.  We will start patient on a Protonix drip and transfuse 1 unit PRBCs.  Given this finding, we will admit patient for further management.  CT head and C-spine are negative for acute  process, lumbar x-rays reviewed by me and show no obvious fracture, negative for acute process per radiology.      ____________________________________________   FINAL CLINICAL IMPRESSION(S) / ED DIAGNOSES  Final diagnoses:  Fall, initial encounter  Gastrointestinal hemorrhage, unspecified gastrointestinal hemorrhage type     ED Discharge Orders    None        Note:  This document was prepared using Dragon voice recognition software and may include unintentional dictation errors.   Blake Divine, MD 08/02/20 2224

## 2020-08-03 ENCOUNTER — Encounter
Admission: EM | Disposition: A | Discharge status: Skilled Nursing Facility | Payer: Self-pay | Source: Home / Self Care | Attending: Family Medicine

## 2020-08-03 ENCOUNTER — Other Ambulatory Visit (HOSPITAL_COMMUNITY): Payer: Medicare Other

## 2020-08-03 ENCOUNTER — Encounter: Payer: Self-pay | Admitting: Internal Medicine

## 2020-08-03 DIAGNOSIS — K921 Melena: Secondary | ICD-10-CM

## 2020-08-03 DIAGNOSIS — D72829 Elevated white blood cell count, unspecified: Secondary | ICD-10-CM | POA: Diagnosis present

## 2020-08-03 DIAGNOSIS — W19XXXA Unspecified fall, initial encounter: Secondary | ICD-10-CM

## 2020-08-03 DIAGNOSIS — N189 Chronic kidney disease, unspecified: Secondary | ICD-10-CM

## 2020-08-03 DIAGNOSIS — I5022 Chronic systolic (congestive) heart failure: Secondary | ICD-10-CM

## 2020-08-03 DIAGNOSIS — N179 Acute kidney failure, unspecified: Secondary | ICD-10-CM

## 2020-08-03 DIAGNOSIS — Z515 Encounter for palliative care: Secondary | ICD-10-CM

## 2020-08-03 HISTORY — PX: GIVENS CAPSULE STUDY: SHX5432

## 2020-08-03 LAB — URINALYSIS, COMPLETE (UACMP) WITH MICROSCOPIC
Bacteria, UA: NONE SEEN
Bilirubin Urine: NEGATIVE
Glucose, UA: NEGATIVE mg/dL
Hgb urine dipstick: NEGATIVE
Ketones, ur: NEGATIVE mg/dL
Leukocytes,Ua: NEGATIVE
Nitrite: NEGATIVE
Protein, ur: NEGATIVE mg/dL
Specific Gravity, Urine: 1.01 (ref 1.005–1.030)
Squamous Epithelial / HPF: NONE SEEN (ref 0–5)
WBC, UA: NONE SEEN WBC/hpf (ref 0–5)
pH: 6 (ref 5.0–8.0)

## 2020-08-03 LAB — CBC WITH DIFFERENTIAL/PLATELET
Abs Immature Granulocytes: 0.08 K/uL — ABNORMAL HIGH (ref 0.00–0.07)
Basophils Absolute: 0 K/uL (ref 0.0–0.1)
Basophils Relative: 0 %
Eosinophils Absolute: 0.1 K/uL (ref 0.0–0.5)
Eosinophils Relative: 1 %
HCT: 21.6 % — ABNORMAL LOW (ref 39.0–52.0)
Hemoglobin: 6.9 g/dL — ABNORMAL LOW (ref 13.0–17.0)
Immature Granulocytes: 1 %
Lymphocytes Relative: 7 %
Lymphs Abs: 0.6 K/uL — ABNORMAL LOW (ref 0.7–4.0)
MCH: 31.2 pg (ref 26.0–34.0)
MCHC: 31.9 g/dL (ref 30.0–36.0)
MCV: 97.7 fL (ref 80.0–100.0)
Monocytes Absolute: 0.8 K/uL (ref 0.1–1.0)
Monocytes Relative: 10 %
Neutro Abs: 6.6 K/uL (ref 1.7–7.7)
Neutrophils Relative %: 81 %
Platelets: 235 K/uL (ref 150–400)
RBC: 2.21 MIL/uL — ABNORMAL LOW (ref 4.22–5.81)
RDW: 21.9 % — ABNORMAL HIGH (ref 11.5–15.5)
WBC: 8.1 K/uL (ref 4.0–10.5)
nRBC: 0.7 % — ABNORMAL HIGH (ref 0.0–0.2)

## 2020-08-03 LAB — HEMOGLOBIN AND HEMATOCRIT, BLOOD
HCT: 24.2 % — ABNORMAL LOW (ref 39.0–52.0)
HCT: 21.4 % — ABNORMAL LOW (ref 39.0–52.0)
HCT: 25.9 % — ABNORMAL LOW (ref 39.0–52.0)
HCT: 26.2 % — ABNORMAL LOW (ref 39.0–52.0)
Hemoglobin: 7.9 g/dL — ABNORMAL LOW (ref 13.0–17.0)
Hemoglobin: 6.8 g/dL — ABNORMAL LOW (ref 13.0–17.0)
Hemoglobin: 8.4 g/dL — ABNORMAL LOW (ref 13.0–17.0)
Hemoglobin: 8.4 g/dL — ABNORMAL LOW (ref 13.0–17.0)

## 2020-08-03 LAB — COMPREHENSIVE METABOLIC PANEL WITH GFR
ALT: 30 U/L (ref 0–44)
AST: 46 U/L — ABNORMAL HIGH (ref 15–41)
Albumin: 3 g/dL — ABNORMAL LOW (ref 3.5–5.0)
Alkaline Phosphatase: 110 U/L (ref 38–126)
Anion gap: 14 (ref 5–15)
BUN: 89 mg/dL — ABNORMAL HIGH (ref 8–23)
CO2: 28 mmol/L (ref 22–32)
Calcium: 8.6 mg/dL — ABNORMAL LOW (ref 8.9–10.3)
Chloride: 88 mmol/L — ABNORMAL LOW (ref 98–111)
Creatinine, Ser: 2.97 mg/dL — ABNORMAL HIGH (ref 0.61–1.24)
GFR, Estimated: 22 mL/min — ABNORMAL LOW (ref >60)
Glucose, Bld: 121 mg/dL — ABNORMAL HIGH (ref 70–99)
Potassium: 2.9 mmol/L — ABNORMAL LOW (ref 3.5–5.1)
Sodium: 130 mmol/L — ABNORMAL LOW (ref 135–145)
Total Bilirubin: 1.7 mg/dL — ABNORMAL HIGH (ref 0.3–1.2)
Total Protein: 5.9 g/dL — ABNORMAL LOW (ref 6.5–8.1)

## 2020-08-03 LAB — PROTIME-INR
INR: 2.4 — ABNORMAL HIGH (ref 0.8–1.2)
Prothrombin Time: 25.7 s — ABNORMAL HIGH (ref 11.4–15.2)

## 2020-08-03 LAB — RESP PANEL BY RT-PCR (FLU A&B, COVID) ARPGX2
Influenza A by PCR: NEGATIVE
Influenza B by PCR: NEGATIVE
SARS Coronavirus 2 by RT PCR: NEGATIVE

## 2020-08-03 LAB — LACTIC ACID, PLASMA: Lactic Acid, Venous: 1.4 mmol/L (ref 0.5–1.9)

## 2020-08-03 LAB — TROPONIN I (HIGH SENSITIVITY): Troponin I (High Sensitivity): 46 ng/L — ABNORMAL HIGH (ref <18)

## 2020-08-03 LAB — PREPARE RBC (CROSSMATCH)

## 2020-08-03 LAB — PHOSPHORUS: Phosphorus: 3.3 mg/dL (ref 2.5–4.6)

## 2020-08-03 LAB — MAGNESIUM: Magnesium: 2.2 mg/dL (ref 1.7–2.4)

## 2020-08-03 SURGERY — IMAGING PROCEDURE, GI TRACT, INTRALUMINAL, VIA CAPSULE

## 2020-08-03 MED ORDER — SUCRALFATE 1 G PO TABS
1.0000 g | ORAL_TABLET | Freq: Two times a day (BID) | ORAL | Status: DC
  Administered 2020-08-03 – 2020-08-10 (×14): 1 g via ORAL
  Filled 2020-08-03 (×14): qty 1

## 2020-08-03 MED ORDER — CHLORHEXIDINE GLUCONATE CLOTH 2 % EX PADS
6.0000 | MEDICATED_PAD | Freq: Every day | CUTANEOUS | Status: DC
  Administered 2020-08-04 – 2020-08-09 (×4): 6 via TOPICAL

## 2020-08-03 MED ORDER — ALLOPURINOL 100 MG PO TABS
100.0000 mg | ORAL_TABLET | Freq: Every day | ORAL | Status: DC
  Administered 2020-08-03 – 2020-08-10 (×7): 100 mg via ORAL
  Filled 2020-08-03 (×8): qty 1

## 2020-08-03 MED ORDER — ACETAMINOPHEN 325 MG PO TABS
650.0000 mg | ORAL_TABLET | Freq: Four times a day (QID) | ORAL | Status: DC | PRN
  Administered 2020-08-03 – 2020-08-10 (×7): 650 mg via ORAL
  Filled 2020-08-03 (×6): qty 2

## 2020-08-03 MED ORDER — VITAMIN B-12 1000 MCG PO TABS
500.0000 ug | ORAL_TABLET | Freq: Every day | ORAL | Status: DC
  Administered 2020-08-03 – 2020-08-10 (×7): 500 ug via ORAL
  Filled 2020-08-03 (×7): qty 1

## 2020-08-03 MED ORDER — SODIUM CHLORIDE 0.9 % IV SOLN
300.0000 mg | Freq: Once | INTRAVENOUS | Status: AC
  Administered 2020-08-03: 300 mg via INTRAVENOUS
  Filled 2020-08-03: qty 15

## 2020-08-03 MED ORDER — DULOXETINE HCL 30 MG PO CPEP
30.0000 mg | ORAL_CAPSULE | Freq: Every day | ORAL | Status: DC
  Administered 2020-08-03 – 2020-08-09 (×7): 30 mg via ORAL
  Filled 2020-08-03 (×8): qty 1

## 2020-08-03 MED ORDER — ACETAMINOPHEN 650 MG RE SUPP: 650.0000 mg | Freq: Four times a day (QID) | RECTAL | Status: DC | PRN

## 2020-08-03 MED ORDER — SODIUM CHLORIDE 0.9 % IV BOLUS
1000.0000 mL | Freq: Once | INTRAVENOUS | Status: AC
  Administered 2020-08-03: 1000 mL via INTRAVENOUS

## 2020-08-03 MED ORDER — TORSEMIDE 100 MG PO TABS
100.0000 mg | ORAL_TABLET | Freq: Every day | ORAL | Status: DC
  Administered 2020-08-03: 100 mg via ORAL
  Filled 2020-08-03: qty 1

## 2020-08-03 MED ORDER — POTASSIUM CHLORIDE CRYS ER 20 MEQ PO TBCR
40.0000 meq | EXTENDED_RELEASE_TABLET | ORAL | Status: AC
  Administered 2020-08-03: 40 meq via ORAL
  Filled 2020-08-03: qty 2

## 2020-08-03 MED ORDER — SODIUM CHLORIDE 0.9% IV SOLUTION
Freq: Once | INTRAVENOUS | Status: DC
  Filled 2020-08-03: qty 250

## 2020-08-03 MED ORDER — ACETAMINOPHEN 325 MG PO TABS
650.0000 mg | ORAL_TABLET | Freq: Four times a day (QID) | ORAL | Status: DC | PRN
  Filled 2020-08-03: qty 2

## 2020-08-03 MED ORDER — ASCORBIC ACID 500 MG PO TABS
250.0000 mg | ORAL_TABLET | Freq: Every day | ORAL | Status: DC
  Administered 2020-08-03 – 2020-08-10 (×7): 250 mg via ORAL
  Filled 2020-08-03 (×7): qty 1

## 2020-08-03 MED ORDER — ATORVASTATIN CALCIUM 20 MG PO TABS: 40.0000 mg | ORAL_TABLET | Freq: Every evening | ORAL | Status: DC

## 2020-08-03 MED ORDER — CARVEDILOL 6.25 MG PO TABS
3.1250 mg | ORAL_TABLET | Freq: Two times a day (BID) | ORAL | Status: DC
  Administered 2020-08-03: 3.125 mg via ORAL
  Filled 2020-08-03: qty 1

## 2020-08-03 MED ORDER — CIPROFLOXACIN IN D5W 400 MG/200ML IV SOLN
400.0000 mg | INTRAVENOUS | Status: DC
  Administered 2020-08-03: 400 mg via INTRAVENOUS
  Filled 2020-08-03: qty 200

## 2020-08-03 MED ORDER — AMIODARONE HCL 200 MG PO TABS
200.0000 mg | ORAL_TABLET | Freq: Every day | ORAL | Status: DC
  Administered 2020-08-03 – 2020-08-10 (×7): 200 mg via ORAL
  Filled 2020-08-03 (×7): qty 1

## 2020-08-03 NOTE — Progress Notes (Signed)
PROGRESS NOTE    Carl [name]  YKD:[id number] DOB: 02/08/1951 DOA: 08/02/2020 PCP: [name], MD   Brief Narrative:  HPI: Carl [name] is a 69 y.o. male with medical history significant for chronic systolic heart failure with most recent echocardiogram showing LVEF 20% status post AICD placement, paroxysmal atrial fibrillation chronically anticoagulated on Eliquis, stage IV chronic kidney disease with baseline creatinine 2.2-2.7, chronic blood loss anemia with baseline hemoglobin of 7-9, cirrhosis, who is admitted to [facility] on 08/02/2020 with acute on chronic blood loss anemia after presenting from home to [facility] Emergency Department for evaluation of ground-level mechanical fall.   Earlier today, ambulate to the car following dinner, the patient reports that he tripped and fell outside.  He did not believe that he hit his head as a component a loss of consciousness.  He reports that this was purely mechanical fall, noting that he tripped in the parking lot.  No preceding or subsequent chest pain.  He was encouraged by the party that he was approached the renal mechanical fall with and therefore presented to the emergency department for further evaluation.  Denies any headache, neck stiffness, vision, dizziness, or vertigo.  The patient reports that he is at his baseline is without complaint at this time.  He denies any shortness of breath, orthopnea, and worsening of his bilateral peripheral edema.  Denies any recent chest pain, palpitations, diaphoresis.  Denies any recent nausea or vomiting.  He acknowledges a history of chronic blood loss anemia in the setting of recurrent gastrointestinal bleeds, but has not recently noticed any melena or hematochezia.  He is chronically anticoagulated on Eliquis in the setting of paroxysmal atrial fibrillation.  Denies any use of NSAIDs.   He denies pain, diarrhea, or rash.  Dysuria, gross hematuria, or change in urinary  urgency/frequency.  In the setting of recurrent gastrointestinal bleeds, it appears that the patient underwent colonoscopy via Dr. [name] on 07/23/2020, as well as preceding EGD on 06/28/2020 via Dr. Watt Climes.  In the setting, the patient reports that his baseline systolic blood pressures about 90 mmHg.   ED Course:  Vital signs in the ED were notable for the following: Temperature max 97.9; heart rate 62-67; initial blood pressure 76/40, which increased to baseline value of 90/53 following a small interval normal saline bolus, as further modified below; respiratory rate 18-20, and oxygen saturation 96 to 99% on room air.  Labs were notable for the following: CMP was notable for the following: Sodium 131, potassium 3.0, creatinine 2.91 relative to baseline range of 2.2-2.7, with most recent prior creatinine noted to be 2.26 when checked on 07/12/2020 CBC was notable for the following White blood cell count of 11,000 relative to most recent prior white blood cell count of 4400 on 07/02/2020.  Hemoglobin 6.8, relative to his baseline range of 7-9, with most recent prior hemoglobin noted to be 8.3.  Today's hemoglobin value associated with elevated RDW, and platelets were noted to be 263.  Serum magnesium level 2.3.  Screening nasopharyngeal COVID-19/influenza PCR checked in the ED this evening found to be negative.  Noncontrast CT of the head showed no evidence of acute intracranial process, including no evidence of intracranial hemorrhage.  CT of the cervical spine showed no evidence of acute fracture or malalignment of the cervical spine.  While in the ED, the following were administered: Lactated Ringer bolus x200 cc.  Protonix drip initiated, and transfusion of 1 unit PRBC very slowly was ordered by the  emergency department physician.  Assessment & Plan:   Principal Problem:   Acute on chronic blood loss anemia Active Problems:   GERD (gastroesophageal reflux disease)   Hypokalemia   Chronic  systolic CHF (congestive heart failure) (HCC)   Cirrhosis (HCC)   Acute kidney injury superimposed on CKD (HCC)   Leukocytosis   Acute on chronic blood loss anemia due to possible upper GI bleed: Reportedly, patient had EGD as well as colonoscopy recently and both of them were unremarkable.  He also saw hematology oncology due to persistent anemia and was recommended weekly IV iron.  Patient yet once again presented with acute on chronic anemia with hemoglobin of 6.8.  He received 1 unit of PRBC transfusion on the night of 08/02/2020.  Hemoglobin improved to 7.9 this morning and dropped again to 6.8.  Patient did not endorse having any evidence of bleeding.  I will transfuse him 1 more unit.  I have consulted GI this morning for possible repeat upper EGD.  Monitor H&H closely.  Continue Protonix drip.  Defer further management to GI.  Continue to hold Eliquis.  Persistent hypotension: Patient blood pressure remains low persistently.  Despite of this, patient did not have any symptoms related to hypotension.  Will order 1 L of IV fluid bolus.  Checking lactic acid.  Brother unable to certify if this is his baseline blood pressure.  It could very well be due to known liver cirrhosis.  Hold torsemide for now.  Acute kidney injury on chronic kidney disease stage IIIb: Could very well be due to upper GI bleed and dehydration is well as ATN now that his blood pressure slightly on the low side.  We will continue intermittent aggressive as well as gentle hydration.  Monitor labs closely.    Ground-level mechanical fall: Likely due to weakness secondary to anemia.  Will consult PT OT to assess him.    Hypokalemia: 2.9.  Will replace orally.  Recheck in the morning.    Leukocytosis: Nonspecific and unremarkable which resolved.  No signs of infection.   Chronic systolic heart failure: with most recent echocardiogram showing evidence of left ventricular ejection fraction of 20%, status post AICD.  He is on  torsemide 100 mg p.o. daily.  In addition to Coreg.  He appears euvolemic however has been running low blood pressure so we will be careful with fluid balance.  Currently getting IV fluid to keep his blood pressure up.   Paroxysmal atrial fibrillation: Chronically anticoagulated on Eliquis which is on hold due to GI bleed as above.  Continue amiodarone however due to low blood pressure, at this point in time, I will discontinue carvedilol as his heart rate is pretty well controlled and monitor.  Liver cirrhosis: INR 2.4.  Monitor.  I do not suspect SBP so I will discontinue ciprofloxacin that was started by admitting hospitalist for prophylaxis of SBP.  Generalized weakness/deconditioning/multiple medical problems: We will consult palliative care for discussion about goals of care  DVT prophylaxis: SCDs Start: 08/03/20 0049   Code Status: DNR  Family Communication: Brother present at bedside.  Plan of care discussed with patient's brother in length and he verbalized understanding and agreed with it.  Status is: Inpatient  Remains inpatient appropriate because:Inpatient level of care appropriate due to severity of illness   Dispo: The patient is from: Home              Anticipated d/c is to: Home  Anticipated d/c date is: 2 days              Patient currently is not medically stable to d/c.    Estimated body mass index is 21.11 kg/m as calculated from the following:   Height as of this encounter: 6\' 1"  (1.854 m).   Weight as of this encounter: 72.6 kg.  Pressure Injury 06/28/20 Hip Left Stage 1 -  Intact skin with non-blanchable redness of a localized area usually over a bony prominence. reddened spot, partially blanchable (Active)  06/28/20 2000  Location: Hip  Location Orientation: Left  Staging: Stage 1 -  Intact skin with non-blanchable redness of a localized area usually over a bony prominence.  Wound Description (Comments): reddened spot, partially blanchable   Present on Admission:      Pressure Injury 06/28/20 Sacrum Mid Stage 1 -  Intact skin with non-blanchable redness of a localized area usually over a bony prominence. circular, quarter sized (Active)  06/28/20 2000  Location: Sacrum  Location Orientation: Mid  Staging: Stage 1 -  Intact skin with non-blanchable redness of a localized area usually over a bony prominence.  Wound Description (Comments): circular, quarter sized  Present on Admission:      Nutritional status:               Consultants:   GI  Procedures:   None  Antimicrobials:  Anti-infectives (From admission, onward)   Start     Dose/Rate Route Frequency Ordered Stop   08/03/20 0100  ciprofloxacin (CIPRO) IVPB 400 mg  Status:  Discontinued        400 mg 200 mL/hr over 60 Minutes Intravenous Every 24 hours 08/03/20 0056 08/03/20 0806         Subjective: Patient seen and examined this morning.  He was still in the ED.  His brother was present at the bedside.  Patient was alert but poor historian.  He seemed to be oriented as well.  His only complaint was lumbar back pain.  No other complaint.  Objective: Vitals:   08/03/20 1000 08/03/20 1100 08/03/20 1130 08/03/20 1215  BP: (!) 95/54 (!) 87/54 (!) 73/55 (!) 88/55  Pulse: 65 66 70 63  Resp: 16 (!) 21 17 17   Temp:      TempSrc:      SpO2: 97% 94% 97% 97%  Weight:      Height:        Intake/Output Summary (Last 24 hours) at 08/03/2020 1251 Last data filed at 08/03/2020 0706 Gross per 24 hour  Intake 744.4 ml  Output 1100 ml  Net -355.6 ml   Filed Weights   08/02/20 1915  Weight: 72.6 kg    Examination:  General exam: Appears calm and comfortable, chronically sick appearing Respiratory system: Clear to auscultation. Respiratory effort normal. Cardiovascular system: S1 & S2 heard, RRR. No JVD, murmurs, rubs, gallops or clicks. No pedal edema. Gastrointestinal system: Abdomen is nondistended, soft and nontender. No organomegaly or  masses felt. Normal bowel sounds heard. Central nervous system: Alert and oriented. No focal neurological deficits. Extremities: Symmetric 5 x 5 power. Skin: No rashes, lesions or ulcers Psychiatry: Judgement and insight appear normal. Mood & affect appropriate.    Data Reviewed: I have personally reviewed following labs and imaging studies  CBC: Recent Labs  Lab 07/27/20 1309 08/02/20 2116 08/03/20 0421 08/03/20 0922 08/03/20 1235  WBC 6.0 11.0* 8.1  --   --   NEUTROABS 4.2 9.2* 6.6  --   --  HGB 8.3* 6.8* 6.9* 7.9* 6.8*  HCT 25.4* 21.2* 21.6* 24.2* 21.4*  MCV 95.1 100.0 97.7  --   --   PLT 267 263 235  --   --    Basic Metabolic Panel: Recent Labs  Lab 08/02/20 1424 08/02/20 2116 08/03/20 0421  NA 130* 131* 130*  K 2.6* 3.0* 2.9*  CL 86* 87* 88*  CO2 32 30 28  GLUCOSE 129* 125* 121*  BUN 72* 82* 89*  CREATININE 2.91* 2.91* 2.97*  CALCIUM 9.1 8.8* 8.6*  MG  --  2.3 2.2  PHOS  --   --  3.3   GFR: Estimated Creatinine Clearance: 24.1 mL/min (A) (by C-G formula based on SCr of 2.97 mg/dL (H)). Liver Function Tests: Recent Labs  Lab 08/02/20 2116 08/03/20 0421  AST 49* 46*  ALT 32 30  ALKPHOS 114 110  BILITOT 1.8* 1.7*  PROT 6.2* 5.9*  ALBUMIN 3.1* 3.0*   No results for input(s): LIPASE, AMYLASE in the last 168 hours. No results for input(s): AMMONIA in the last 168 hours. Coagulation Profile: Recent Labs  Lab 08/03/20 0421  INR 2.4*   Cardiac Enzymes: No results for input(s): CKTOTAL, CKMB, CKMBINDEX, TROPONINI in the last 168 hours. BNP (last 3 results) No results for input(s): PROBNP in the last 8760 hours. HbA1C: No results for input(s): HGBA1C in the last 72 hours. CBG: No results for input(s): GLUCAP in the last 168 hours. Lipid Profile: No results for input(s): CHOL, HDL, LDLCALC, TRIG, CHOLHDL, LDLDIRECT in the last 72 hours. Thyroid Function Tests: No results for input(s): TSH, T4TOTAL, FREET4, T3FREE, THYROIDAB in the last 72  hours. Anemia Panel: No results for input(s): VITAMINB12, FOLATE, FERRITIN, TIBC, IRON, RETICCTPCT in the last 72 hours. Sepsis Labs: No results for input(s): PROCALCITON, LATICACIDVEN in the last 168 hours.  Recent Results (from the past 240 hour(s))  Resp Panel by RT-PCR (Flu A&B, Covid) Nasopharyngeal Swab     Status: None   Collection Time: 08/02/20 10:54 PM   Specimen: Nasopharyngeal Swab; Nasopharyngeal(NP) swabs in vial transport medium  Result Value Ref Range Status   SARS Coronavirus 2 by RT PCR NEGATIVE NEGATIVE Final    Comment: (NOTE) SARS-CoV-2 target nucleic acids are NOT DETECTED.  The SARS-CoV-2 RNA is generally detectable in upper respiratory specimens during the acute phase of infection. The lowest concentration of SARS-CoV-2 viral copies this assay can detect is 138 copies/mL. A negative result does not preclude SARS-Cov-2 infection and should not be used as the sole basis for treatment or other patient management decisions. A negative result may occur with  improper specimen collection/handling, submission of specimen other than nasopharyngeal swab, presence of viral mutation(s) within the areas targeted by this assay, and inadequate number of viral copies(<138 copies/mL). A negative result must be combined with clinical observations, patient history, and epidemiological information. The expected result is Negative.  Fact Sheet for Patients:  EntrepreneurPulse.com.au  Fact Sheet for Healthcare Providers:  IncredibleEmployment.be  This test is no t yet approved or cleared by the [place] FDA and  has been authorized for detection and/or diagnosis of SARS-CoV-2 by FDA under an Emergency Use Authorization (EUA). This EUA will remain  in effect (meaning this test can be used) for the duration of the COVID-19 declaration under Section 564(b)(1) of the Act, 21 U.S.C.section 360bbb-3(b)(1), unless the authorization is  terminated  or revoked sooner.       Influenza A by PCR NEGATIVE NEGATIVE Final   Influenza B by PCR NEGATIVE  NEGATIVE Final    Comment: (NOTE) The Xpert Xpress SARS-CoV-2/FLU/RSV plus assay is intended as an aid in the diagnosis of influenza from Nasopharyngeal swab specimens and should not be used as a sole basis for treatment. Nasal washings and aspirates are unacceptable for Xpert Xpress SARS-CoV-2/FLU/RSV testing.  Fact Sheet for Patients: EntrepreneurPulse.com.au  Fact Sheet for Healthcare Providers: IncredibleEmployment.be  This test is not yet approved or cleared by the [place] FDA and has been authorized for detection and/or diagnosis of SARS-CoV-2 by FDA under an Emergency Use Authorization (EUA). This EUA will remain in effect (meaning this test can be used) for the duration of the COVID-19 declaration under Section 564(b)(1) of the Act, 21 U.S.C. section 360bbb-3(b)(1), unless the authorization is terminated or revoked.  Performed at [facility], [address] [place], [place] [address]       Radiology Studies: DG Lumbar Spine 2-3 Views  Result Date: 08/02/2020 CLINICAL DATA:  Fall EXAM: LUMBAR SPINE - 2-3 VIEW COMPARISON:  CT 06/20/2019 FINDINGS: Lumbar alignment within normal limits. Vertebral body heights are maintained. Mild degenerative changes at L2-L3. Facet degenerative changes of the lower lumbar spine. Aortic atherosclerosis. IMPRESSION: No acute osseous abnormality. Electronically Signed   By: Donavan Foil M.D.   On: 08/02/2020 21:10   CT Head Wo Contrast  Result Date: 08/02/2020 CLINICAL DATA:  Fall EXAM: CT HEAD WITHOUT CONTRAST TECHNIQUE: Contiguous axial images were obtained from the base of the skull through the vertex without intravenous contrast. COMPARISON:  October 16, 2018 FINDINGS: Brain: No evidence of acute territorial infarction, hemorrhage, hydrocephalus,extra-axial collection or  mass lesion/mass effect. There is dilatation the ventricles and sulci consistent with age-related atrophy. Low-attenuation changes in the deep white matter consistent with small vessel ischemia. Vascular: No hyperdense vessel or unexpected calcification. Skull: The skull is intact. No fracture or focal lesion identified. Sinuses/Orbits: Small amount of fluid seen within the sphenoid sinuses. The orbits and globes intact. Other: None Cervical spine: Alignment: There is a grade 1 anterolisthesis of C4-C5 measuring 3 mm. Skull base and vertebrae: Visualized skull base is intact. No atlanto-occipital dissociation. The vertebral body heights are well maintained. No fracture or pathologic osseous lesion seen. Soft tissues and spinal canal: The visualized paraspinal soft tissues are unremarkable. No prevertebral soft tissue swelling is seen. The spinal canal is grossly unremarkable, no large epidural collection or significant canal narrowing. Disc levels: Cervical spine spondylosis is seen disc osteophyte uncovertebral osteophytes most notable C3-C4 with severe left neural foraminal narrowing and central canal stenosis. Upper chest: Biapical scarring is seen. Thoracic inlet is within normal limits. Other: None IMPRESSION: No acute intracranial abnormality. Findings consistent with age related atrophy and chronic small vessel ischemia No acute fracture or malalignment of the spine. Stable grade 1 anterolisthesis C4 on C5. Cervical spine spondylosis most notable at C3-C4. Electronically Signed   By: Prudencio Pair M.D.   On: 08/02/2020 21:06   CT Cervical Spine Wo Contrast  Result Date: 08/02/2020 CLINICAL DATA:  Fall EXAM: CT HEAD WITHOUT CONTRAST TECHNIQUE: Contiguous axial images were obtained from the base of the skull through the vertex without intravenous contrast. COMPARISON:  October 16, 2018 FINDINGS: Brain: No evidence of acute territorial infarction, hemorrhage, hydrocephalus,extra-axial collection or mass  lesion/mass effect. There is dilatation the ventricles and sulci consistent with age-related atrophy. Low-attenuation changes in the deep white matter consistent with small vessel ischemia. Vascular: No hyperdense vessel or unexpected calcification. Skull: The skull is intact. No fracture or focal lesion identified. Sinuses/Orbits: Small  amount of fluid seen within the sphenoid sinuses. The orbits and globes intact. Other: None Cervical spine: Alignment: There is a grade 1 anterolisthesis of C4-C5 measuring 3 mm. Skull base and vertebrae: Visualized skull base is intact. No atlanto-occipital dissociation. The vertebral body heights are well maintained. No fracture or pathologic osseous lesion seen. Soft tissues and spinal canal: The visualized paraspinal soft tissues are unremarkable. No prevertebral soft tissue swelling is seen. The spinal canal is grossly unremarkable, no large epidural collection or significant canal narrowing. Disc levels: Cervical spine spondylosis is seen disc osteophyte uncovertebral osteophytes most notable C3-C4 with severe left neural foraminal narrowing and central canal stenosis. Upper chest: Biapical scarring is seen. Thoracic inlet is within normal limits. Other: None IMPRESSION: No acute intracranial abnormality. Findings consistent with age related atrophy and chronic small vessel ischemia No acute fracture or malalignment of the spine. Stable grade 1 anterolisthesis C4 on C5. Cervical spine spondylosis most notable at C3-C4. Electronically Signed   By: Prudencio Pair M.D.   On: 08/02/2020 21:06    Scheduled Meds: . allopurinol  100 mg Oral Daily  . amiodarone  200 mg Oral Daily  . ascorbic acid  250 mg Oral Daily  . atorvastatin  40 mg Oral QPM  . carvedilol  3.125 mg Oral BID  . DULoxetine  30 mg Oral QHS  . [START ON 08/06/2020] pantoprazole  40 mg Intravenous Q12H  . potassium chloride  40 mEq Oral Q4H  . sucralfate  1 g Oral BID  . torsemide  100 mg Oral Daily  .  vitamin B-12  500 mcg Oral Daily   Continuous Infusions: . pantoprozole (PROTONIX) infusion 8 mg/hr (08/03/20 1131)     LOS: 1 day   Time spent: 73 minutes   Darliss Cheney, MD [facility]  08/03/2020, 12:51 PM   To contact the attending provider between 7A-7P or the covering provider during after hours 7P-7A, please log into the web site [email or web address].

## 2020-08-03 NOTE — Brief Op Note (Signed)
Givens capsule swallowed by pt, tolerated well. Belt and monitor explained to pt and sister at bedside. Voiced understanding. All po fluids taken away until 8-8:30pm. ED nurse aware. Endo RN will return in a.m. to retrieve equipment

## 2020-08-03 NOTE — ED Notes (Signed)
Dr. Doristine Bosworth aware of patients hgb, additional 1 unit of blood to be given, patient resting comfortably on stretcher with family at bedside. Patient has IVF bolus infusing.

## 2020-08-03 NOTE — Consult Note (Signed)
Consultation Note Date: 08/03/2020   Patient Name: Carl [name]  DOB: Jul 21, 1951  MRN: [id number]  Age / Sex: 69 y.o., male  PCP: [name], MD Referring Physician: Darliss Cheney, MD  Reason for Consultation: Establishing goals of care  HPI/Patient Profile: Carl [name] is a 69 y.o. male with medical history significant for chronic systolic heart failure with most recent echocardiogram showing LVEF 20% status post AICD placement, paroxysmal atrial fibrillation chronically anticoagulated on Eliquis, stage IV chronic kidney disease with baseline creatinine 2.2-2.7, chronic blood loss anemia with baseline hemoglobin of 7-9, cirrhosis, who is admitted to [facility] on 08/02/2020 with acute on chronic blood loss anemia after presenting from home to [facility] Emergency Department for evaluation of ground-level mechanical fall. Noncontrast CT of the head showed no evidence of acute intracranial process, including no evidence of intracranial hemorrhage. CT of the cervical spine showed no evidence of acute fracture or malalignment of the cervical spine. Recent EGD and colonoscopy unremarkable. Palliative medicine consultation for goals of care.   Clinical Assessment and Goals of Care: PMT consult received, chart reviewed, and met with patient and sister [name]) at bedside. Patient is sleeping comfortably. He is currently receiving a blood transfusion.   Patient seen by outpatient palliative provider through [place], Christin Gusler NP. Note reviewed.   [name] reports that her brother lives alone with his dog and cat. Family is supportive and readily available (daughter, brother, sister).   Discussed events leading up to hospitalization including diagnoses, interventions, plan of care including pending GI evaluation. Answered questions.   [name] confirms her brother's decision for DNR code  status. She does not think he has documented living will/POA paperwork. Introduced MOST form.   Reassured of PMT provider f/u Monday for further discussion pending clinical course.    SUMMARY OF RECOMMENDATIONS    DNR code status.  Continue current plan of care and medical management. Watchful waiting. Time for outcomes.  GI evaluation pending.   PT/OT  Currently followed by outpatient palliative.  PMT provider returns to [facility] on Monday and plans to f/u with patient/family.  Code Status/Advance Care Planning:  DNR  Symptom Management:   Per attending  Palliative Prophylaxis:   Aspiration, Delirium Protocol, Frequent Pain Assessment, Oral Care and Turn Reposition  Psycho-social/Spiritual:   Desire for further Chaplaincy support: yes  Additional Recommendations: Caregiving  Support/Resources  Prognosis:   Unable to determine  Discharge Planning: Likely will need SNF rehab     Primary Diagnoses: Present on Admission: . Acute on chronic blood loss anemia . Acute kidney injury superimposed on CKD ([place]) . Leukocytosis . GERD (gastroesophageal reflux disease) . Chronic systolic CHF (congestive heart failure) ([place]) . Hypokalemia . Cirrhosis ([place])   I have reviewed the medical record, interviewed the patient and family, and examined the patient. The following aspects are pertinent.  Past Medical History:  Diagnosis Date  . AICD (automatic cardioverter/defibrillator) present   . Anemia   . Anxiety   . Arthritis   . Brunner's gland hyperplasia of  duodenum   . CHF (congestive heart failure) ([place])   . Chronic kidney disease   . Cirrhosis of liver ([place])   . Coronary artery disease   . Depression   . Dysrhythmia    atrial fib  . External hemorrhoids   . Fracture of femoral neck, left ([place]) 07/21/2018  . GERD (gastroesophageal reflux disease)   . Gout   . Hearing loss   . Heart murmur    mirtal valve insufficiency  . Hepatic failure ([place])   . HH (hiatus  hernia)   . Hyperlipidemia   . Hypokalemia   . Hyposmolality   . Intellectual disability    mild  . Internal hemorrhoids   . Ischemic cardiomyopathy   . Leg fracture, left   . Myocardial infarction ([place])   . Osteoporosis   . Paronychia   . Peripheral vascular disease ([place])   . Pneumonia   . Psoriasis   . Psoriasis   . PUD (peptic ulcer disease)   . Schatzki's ring   . Tubular adenoma of colon   . Vitiligo    Social History   Socioeconomic History  . Marital status: Single    Spouse name: Not on file  . Number of children: Not on file  . Years of education: Not on file  . Highest education level: Not on file  Occupational History  . Not on file  Tobacco Use  . Smoking status: Former Smoker    Quit date: 2014    Years since quitting: 7.9  . Smokeless tobacco: Never Used  . Tobacco comment: 07/30/2017 Quit in 2014  Vaping Use  . Vaping Use: Never used  Substance and Sexual Activity  . Alcohol use: No    Alcohol/week: 0.0 standard drinks  . Drug use: No  . Sexual activity: Never  Other Topics Concern  . Not on file  Social History Narrative   Pt lives in [place] with his mother.   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 Valvular heart disease Mother        Ruptured valve  . CAD Father   . Heart Problems Brother        Stents x 4  . Diabetes Brother   . Prostate cancer Neg Hx   . Bladder Cancer Neg Hx   . Kidney cancer Neg Hx    Scheduled Meds: . sodium chloride   Intravenous Once  . allopurinol  100 mg Oral Daily  . amiodarone  200 mg Oral Daily  . ascorbic acid  250 mg Oral Daily  . DULoxetine  30 mg Oral QHS  . [START ON 08/06/2020] pantoprazole  40 mg Intravenous Q12H  .  potassium chloride  40 mEq Oral Q4H  . sucralfate  1 g Oral BID  . vitamin B-12  500 mcg Oral Daily   Continuous Infusions: . pantoprozole (PROTONIX) infusion 8 mg/hr (08/03/20 1131)   PRN Meds:.acetaminophen Medications Prior to Admission:  Prior to Admission medications   Medication Sig Start Date End Date Taking? Authorizing Provider  acetaminophen (TYLENOL) 500 MG tablet Take 500-1,000 mg by mouth every 6 (six) hours as needed (for pain.).    [provider]  allopurinol (ZYLOPRIM)  100 MG tablet Take 100 mg by mouth daily.  02/08/19   [provider]  amiodarone (PACERONE) 200 MG tablet Take 1 tablet (200 mg total) by mouth daily. 04/13/20   Bensimhon, Shaune Pascal, MD  Ascorbic Acid (VITAMIN C) 100 MG tablet Take 100 mg by mouth daily.    [provider]  atorvastatin (LIPITOR) 40 MG tablet Take 40 mg by mouth every evening.    [provider]  carvedilol (COREG) 3.125 MG tablet Take 1 tablet (3.125 mg total) by mouth 2 (two) times daily. 05/21/20   Consuelo Pandy, PA-C  Cholecalciferol 50 MCG (2000 UT) TABS Take 2,000 Units by mouth at bedtime.     [provider]  cyanocobalamin 500 MCG tablet Take 500 mcg by mouth daily.     [provider]  docusate sodium (COLACE) 100 MG capsule Take 100 mg by mouth daily as needed for mild constipation.    [provider]  DULoxetine (CYMBALTA) 30 MG capsule Take 30 mg by mouth at bedtime.     [provider]  ELIQUIS 5 MG TABS tablet Take 5 mg by mouth 2 (two) times daily. 07/14/20   [provider]  ferrous sulfate 325 (65 FE) MG tablet Take 1 tablet (325 mg total) by mouth daily with breakfast. 02/09/18   [name], MD  gabapentin (NEURONTIN) 300 MG capsule Take 300 mg by mouth at bedtime.     [provider]  ketoconazole (NIZORAL) 2 % shampoo Apply 1 application topically 2 (two) times a week. Mon and Thu 09/28/18   [provider]  lactulose  (CHRONULAC) 10 GM/15ML solution Take 30 mLs (20 g total) by mouth daily. 07/05/20   Gaylan Gerold, DO  metolazone (ZAROXOLYN) 2.5 MG tablet Take 1 tablet twice a week, every Monday and Thursday. Take 20 mEq Potassium with Metolazone. 07/30/20   [name], MD  Multiple Vitamin (MULTIVITAMIN) tablet Take 1 tablet by mouth at bedtime.     [provider]  pantoprazole (PROTONIX) 40 MG tablet TAKE 1 TABLET BY MOUTH EVERY DAY Patient taking differently: Take 40 mg by mouth daily. 12/08/19   [name], MD  potassium chloride SA (KLOR-CON M20) 20 MEQ tablet Take 1 tablet (20 mEq total) by mouth daily. Patient taking differently: Take 20 mEq by mouth daily. He is taking an extra 20 meq with metolazone 06/06/20   [name], MD  sucralfate (CARAFATE) 1 g tablet Take 1 tablet (1 g total) by mouth 2 (two) times daily. 07/05/20   Gaylan Gerold, DO  torsemide (DEMADEX) 100 MG tablet Take 1 tablet (100 mg total) by mouth daily. 08/02/20   Consuelo Pandy, PA-C   Allergies  Allergen Reactions  . Spironolactone Other (See Comments)    gynecomastia    Review of Systems  Neurological: Positive for weakness.   Physical Exam Vitals and nursing note reviewed.  Constitutional:      General: He is sleeping.  Cardiovascular:     Rate and Rhythm: Normal rate.  Pulmonary:     Effort: No tachypnea, accessory muscle usage or respiratory distress.  Skin:    General: Skin is warm and dry.     Coloration: Skin is pale.  Neurological:     Comments: Sleeping, comfortable    Vital Signs: BP (!) 99/59   Pulse 75   Temp 98.6 F (37 C) (Oral)   Resp 16   Ht '6\' 1"'  (1.854 m)   Wt 72.6  kg   SpO2 97%   BMI 21.11 kg/m  Pain Scale: FLACC      SpO2: SpO2: 97 % O2 Device:SpO2: 97 % O2 Flow Rate: .   IO: Intake/output summary:   Intake/Output Summary (Last 24 hours) at 08/03/2020 1605 Last data filed at 08/03/2020 0706 Gross per 24 hour  Intake 744.4 ml  Output 1100 ml  Net  -355.6 ml    LBM:   Baseline Weight: Weight: 72.6 kg Most recent weight: Weight: 72.6 kg     Palliative Assessment/Data: PPS 50%     Time Total: 30 Greater than 50%  of this time was spent counseling and coordinating care related to the above assessment and plan.  Signed by:  Ihor Dow, DNP, FNP-C Palliative Medicine Team  Phone: [phone] Fax: [phone]   Please contact Palliative Medicine Team phone at [phone] for questions and concerns.  For individual provider: See Shea Evans

## 2020-08-03 NOTE — ED Notes (Signed)
Pt reports feeling constipated however when patient blue pad checked for urinary incontinence it is noted that patient has passed small amount of small dark black stool.  No urine noted on pad - plan to bladder scan patient and notify provider

## 2020-08-03 NOTE — Progress Notes (Signed)
OT Cancellation Note  Patient Details Name: Carl [name] MRN: [id number] DOB: 1950/10/27   Cancelled Treatment:    Reason Eval/Treat Not Completed: Medical issues which prohibited therapy. Chart reviewed - pt noted to have lab values critically low (Hgb 6.8, HCT 21.4, K 2.9); contraindicated for exertional activity at this time. Plan for additional blood to be transfused. Will continue to follow and initiate services as pt medically appropriate to participate in therapy.   Dessie Coma, M.S. OTR/L  08/03/20, 1:40 PM  ascom [phone]

## 2020-08-03 NOTE — Consult Note (Signed)
Cephas Darby, MD [address]  [place]  [employer], [place] [address]  Main: [phone]  Fax: [phone] Pager: [phone]   Consultation  Referring Provider:     No ref. provider found Primary Care Physician:  [name], MD Primary Gastroenterologist:  Dr [name]        Reason for Consultation:     Melena, anemia  Date of Admission:  08/02/2020 Date of Consultation:  08/03/2020         HPI:   Carl [name] is a 69 y.o. male with history of severe cardiomyopathy, EF of 20%, s/p AICD, paroxysmal A. fib on Eliquis, stage IV CKD, chronic iron deficiency anemia, history of large bowel AVMs, portal hypertensive gastropathy who presented from home after a fall, witnessed by his brother, brought by EMS.  He was mildly hypotensive in the ER, which is thought to be his baseline.  Labs revealed low potassium 2.6, elevated BUN compared to baseline with elevated creatinine, 72/2.91, drop in hemoglobin 6.8 compared to 8.3, 1-week ago.  His rectal exam revealed dark stool, therefore GI is consulted for further evaluation.  Patient is seen by Dr. Tasia Catchings, hematologist as outpatient on 12/6 who recommended IV iron for iron deficiency anemia and his anemia was thought to be combination of iron deficiency as well as CKD.  Of note, patient has chronic history of iron deficiency anemia, followed by [facility] clinic gastroenterology since 10/2014 for melena, iron deficiency anemia.  He underwent bidirectional endoscopy as well as with a capsule study in 2016, with diminutive small bowel erosions identified only. Most recently seen by Dr. [name] on 07/17/2020 for well compensated cirrhosis as well as iron deficiency anemia, underwent upper endoscopy as well as colonoscopy which revealed portal hypertensive gastropathy, pancolonic diverticulosis and hemorrhoids.  Subcentimeter polyps were removed as well.  He is also closely followed by cardiology for CHF, fluid overload  Patient underwent CT head  and C-spine in the ER, with no acute intracranial bleed identified  NSAIDs: None  Antiplts/Anticoagulants/Anti thrombotics: Eliquis for history of chronic A. fib, last dose on 12/9 AM  GI Procedures:  Colonoscopy 11/2014 DIAGNOSIS:  A. COLON POLYP, SIGMOID; COLD SNARE:  - TUBULAR ADENOMA.  - NEGATIVE FOR HIGH-GRADE DYSPLASIA AND MALIGNANCY.   EGD and colonoscopy 07/2017 DIAGNOSIS:  A. STOMACH, ANTRUM AND DISTAL BODY; COLD BIOPSY:  - MILD REACTIVE GASTROPATHY WITH FOCAL ANTRAL ACTIVE INFLAMMATION.  - NEGATIVE FOR INTESTINAL METAPLASIA, DYSPLASIA, AND MALIGNANCY.  - NEGATIVE FOR H. PYLORI IN HEMATOXYLIN AND EOSIN SECTIONS.   B. ESOPHAGUS, DISTAL; COLD BIOPSY:  - COLUMNAR-LINED MUCOSA, GASTRIC OXYNTIC-TYPE, WITH MILD CHRONIC  INFLAMMATION AND REACTIVE FOVEOLAR HYPERPLASIA.  - NEGATIVE FOR GOBLET CELLS, DYSPLASIA, AND MALIGNANCY.   C. COLON POLYP, SIGMOID; HOT SNARE:  - TUBULAR ADENOMA.  - NEGATIVE FOR HIGH-GRADE DYSPLASIA AND MALIGNANCY.   EGD 10/2014 DIAGNOSIS:  A. DUODENAL SWEEP; COLD BIOPSY:  - DUODENAL MUCOSA WITH UNDERLYING NODULAR BRUNNER GLAND HYPERPLASIA  - NEGATIVE FOR INTRA-EPITHELIAL LYMPHOCYTOSIS, DYSPLASIA AND MALIGNANCY.  EGD 06/28/20  - Small hiatal hernia. - Portal hypertensive gastropathy. - Normal duodenal bulb, first portion of the duodenum, second portion of the duodenum and third portion of the duodenum. - The examination was otherwise normal. - No specimens collected.  Colonoscopy 07/23/20 - Preparation of the colon was inadequate. - Non-bleeding internal hemorrhoids. - Diverticulosis in the entire examined colon. - One 5 mm polyp in the cecum, removed with a jumbo cold forceps. Resected and retrieved. - One 8 mm polyp  in the transverse colon, removed with a hot snare. Incomplete resection. Resected tissue not retrieved. - Stool in the transverse colon, in the ascending colon and in the cecum. - The examination was otherwise normal. DIAGNOSIS:  A.  COLON POLYP, CECUM; COLD BIOPSY:  - BENIGN COLONIC MUCOSA WITH SUPERFICIAL HYPERPLASTIC/REACTIVE CHANGES.  - NEGATIVE FOR DYSPLASIA AND MALIGNANCY.   Past Medical History:  Diagnosis Date  . AICD (automatic cardioverter/defibrillator) present   . Anemia   . Anxiety   . Arthritis   . Brunner's gland hyperplasia of duodenum   . CHF (congestive heart failure) ([place])   . Chronic kidney disease   . Cirrhosis of liver ([place])   . Coronary artery disease   . Depression   . Dysrhythmia    atrial fib  . External hemorrhoids   . Fracture of femoral neck, left ([place]) 07/21/2018  . GERD (gastroesophageal reflux disease)   . Gout   . Hearing loss   . Heart murmur    mirtal valve insufficiency  . Hepatic failure ([place])   . HH (hiatus hernia)   . Hyperlipidemia   . Hypokalemia   . Hyposmolality   . Intellectual disability    mild  . Internal hemorrhoids   . Ischemic cardiomyopathy   . Leg fracture, left   . Myocardial infarction ([place])   . Osteoporosis   . Paronychia   . Peripheral vascular disease ([place])   . Pneumonia   . Psoriasis   . Psoriasis   . PUD (peptic ulcer disease)   . Schatzki's ring   . Tubular adenoma of colon   . Vitiligo     Past Surgical History:  Procedure Laterality Date  . ATRIAL SEPTAL DEFECT(ASD) CLOSURE N/A 12/30/2018   Procedure: ATRIAL SEPTAL DEFECT(ASD) CLOSURE;  Surgeon: [name], MD;  Location: [place] CV LAB;  Service: Cardiovascular;  Laterality: N/A;  . CARDIAC SURGERY     ICD  . CARDIOVERSION N/A 11/09/2019   Procedure: CARDIOVERSION;  Surgeon: [name], MD;  Location: [facility] ENDOSCOPY;  Service: Cardiovascular;  Laterality: N/A;  . CARDIOVERSION N/A 12/21/2019   Procedure: CARDIOVERSION;  Surgeon: [name], MD;  Location: [place];  Service: Cardiovascular;  Laterality: N/A;  . CATARACT EXTRACTION W/PHACO Right 07/26/2020   Procedure: CATARACT EXTRACTION PHACO AND INTRAOCULAR LENS PLACEMENT ([place]) RIGHT VISION BLUE;  Surgeon:  Birder Robson, MD;  Location: [facility] ORS;  Service: Ophthalmology;  Laterality: Right;  [place] 01:14.1 CDE 13.65 Fluid Pack Lot # [id number]  . COLONOSCOPY WITH ESOPHAGOGASTRODUODENOSCOPY (EGD)    . COLONOSCOPY WITH PROPOFOL N/A 08/24/2017   Procedure: COLONOSCOPY WITH PROPOFOL;  Surgeon: [name], MD;  Location: [facility] ENDOSCOPY;  Service: Endoscopy;  Laterality: N/A;  . COLONOSCOPY WITH PROPOFOL N/A 07/23/2020   Procedure: COLONOSCOPY WITH PROPOFOL;  Surgeon: Toledo, [name], MD;  Location: [facility] ENDOSCOPY;  Service: Gastroenterology;  Laterality: N/A;  . CORONARY ARTERY BYPASS GRAFT N/A 08/03/2014   Procedure: CORONARY ARTERY BYPASS GRAFTING (CABG);  Surgeon: [name], MD;  Location: [place];  Service: Open Heart Surgery;  Laterality: N/A;  . EP IMPLANTABLE DEVICE N/A 05/01/2015   MDT ICD implanted for primary prevention of sudden death  . ESOPHAGOGASTRODUODENOSCOPY N/A 06/28/2020   Procedure: ESOPHAGOGASTRODUODENOSCOPY (EGD);  Surgeon: Clarene Essex, MD;  Location: [place];  Service: Endoscopy;  Laterality: N/A;  . ESOPHAGOGASTRODUODENOSCOPY (EGD) WITH PROPOFOL N/A 04/16/2015   Procedure: ESOPHAGOGASTRODUODENOSCOPY (EGD) WITH PROPOFOL;  Surgeon: [name], MD;  Location: [facility] ENDOSCOPY;  Service: Endoscopy;  Laterality: N/A;  .  ESOPHAGOGASTRODUODENOSCOPY (EGD) WITH PROPOFOL N/A 08/24/2017   Procedure: ESOPHAGOGASTRODUODENOSCOPY (EGD) WITH PROPOFOL;  Surgeon: [name], MD;  Location: [facility] ENDOSCOPY;  Service: Endoscopy;  Laterality: N/A;  . FRACTURE SURGERY Left    ORIF lower leg  . HERNIA REPAIR    . HIP PINNING,CANNULATED Left 07/22/2018   Procedure: CANNULATED HIP PINNING;  Surgeon: [name], MD;  Location: [place];  Service: Orthopedics;  Laterality: Left;  . MITRAL VALVE REPAIR N/A 12/30/2018   Procedure: MITRAL VALVE REPAIR;  Surgeon: [name], MD;  Location: [place] [facility];  Service: Cardiovascular;  Laterality: N/A;  . RIGHT/LEFT HEART CATH  AND CORONARY/GRAFT ANGIOGRAPHY N/A 12/21/2018   Procedure: RIGHT/LEFT HEART CATH AND CORONARY/GRAFT ANGIOGRAPHY;  Surgeon: [name], MD;  Location: [place] CV LAB;  Service: Cardiovascular;  Laterality: N/A;  . TEE WITHOUT CARDIOVERSION N/A 08/03/2014   Procedure: TRANSESOPHAGEAL ECHOCARDIOGRAM (TEE);  Surgeon: [name], MD;  Location: [place];  Service: Open Heart Surgery;  Laterality: N/A;  . TEE WITHOUT CARDIOVERSION N/A 02/10/2018   Procedure: TRANSESOPHAGEAL ECHOCARDIOGRAM (TEE);  Surgeon: [name], MD;  Location: [facility] ENDOSCOPY;  Service: Cardiovascular;  Laterality: N/A;  . TEE WITHOUT CARDIOVERSION N/A 11/09/2019   Procedure: TRANSESOPHAGEAL ECHOCARDIOGRAM (TEE);  Surgeon: [name], MD;  Location: [facility] ENDOSCOPY;  Service: Cardiovascular;  Laterality: N/A;    Current Facility-Administered Medications:  .  0.9 %  sodium chloride infusion (Manually program via Guardrails IV Fluids), , Intravenous, Once, Pahwani, Ravi, MD .  acetaminophen (TYLENOL) tablet 650 mg, 650 mg, Oral, Q6H PRN, Darliss Cheney, MD, 650 mg at 08/03/20 0909 .  allopurinol (ZYLOPRIM) tablet 100 mg, 100 mg, Oral, Daily, Howerter, Justin B, DO, 100 mg at 08/03/20 0910 .  amiodarone (PACERONE) tablet 200 mg, 200 mg, Oral, Daily, Howerter, Justin B, DO, 200 mg at 08/03/20 0910 .  ascorbic acid (VITAMIN C) tablet 250 mg, 250 mg, Oral, Daily, Howerter, Justin B, DO, 250 mg at 08/03/20 0911 .  DULoxetine (CYMBALTA) DR capsule 30 mg, 30 mg, Oral, QHS, Howerter, Justin B, DO .  pantoprazole (PROTONIX) 80 mg in sodium chloride 0.9 % 100 mL (0.8 mg/mL) infusion, 8 mg/hr, Intravenous, Continuous, Howerter, Justin B, DO, Last Rate: 10 mL/hr at 08/03/20 1131, 8 mg/hr at 08/03/20 1131 .  [START ON 08/06/2020] pantoprazole (PROTONIX) injection 40 mg, 40 mg, Intravenous, Q12H, Howerter, Justin B, DO .  sucralfate (CARAFATE) tablet 1 g, 1 g, Oral, BID, Howerter, Justin B, DO, 1 g at 08/03/20 0912 .  vitamin B-12  (CYANOCOBALAMIN) tablet 500 mcg, 500 mcg, Oral, Daily, Howerter, Justin B, DO, 500 mcg at 08/03/20 9163  Current Outpatient Medications:  .  acetaminophen (TYLENOL) 500 MG tablet, Take 500-1,000 mg by mouth every 6 (six) hours as needed (for pain.)., Disp: , Rfl:  .  allopurinol (ZYLOPRIM) 100 MG tablet, Take 100 mg by mouth daily. , Disp: , Rfl:  .  amiodarone (PACERONE) 200 MG tablet, Take 1 tablet (200 mg total) by mouth daily., Disp: 90 tablet, Rfl: 1 .  Ascorbic Acid (VITAMIN C) 100 MG tablet, Take 100 mg by mouth daily., Disp: , Rfl:  .  atorvastatin (LIPITOR) 40 MG tablet, Take 40 mg by mouth every evening., Disp: , Rfl:  .  carvedilol (COREG) 3.125 MG tablet, Take 1 tablet (3.125 mg total) by mouth 2 (two) times daily., Disp: 60 tablet, Rfl: 3 .  Cholecalciferol 50 MCG (2000 UT) TABS, Take 2,000 Units by mouth at bedtime. , Disp: , Rfl:  .  cyanocobalamin 500 MCG tablet, Take 500 mcg by mouth daily. , Disp: , Rfl:  .  docusate sodium (COLACE) 100 MG capsule, Take 100 mg by mouth daily as needed for mild constipation., Disp: , Rfl:  .  DULoxetine (CYMBALTA) 30 MG capsule, Take 30 mg by mouth at bedtime. , Disp: , Rfl:  .  ELIQUIS 5 MG TABS tablet, Take 5 mg by mouth 2 (two) times daily., Disp: , Rfl:  .  ferrous sulfate 325 (65 FE) MG tablet, Take 1 tablet (325 mg total) by mouth daily with breakfast., Disp: 30 tablet, Rfl: 3 .  gabapentin (NEURONTIN) 300 MG capsule, Take 300 mg by mouth at bedtime. , Disp: , Rfl:  .  ketoconazole (NIZORAL) 2 % shampoo, Apply 1 application topically 2 (two) times a week. Mon and Thu, Disp: , Rfl:  .  lactulose (CHRONULAC) 10 GM/15ML solution, Take 30 mLs (20 g total) by mouth daily., Disp: 236 mL, Rfl: 0 .  metolazone (ZAROXOLYN) 2.5 MG tablet, Take 1 tablet twice a week, every Monday and Thursday. Take 20 mEq Potassium with Metolazone., Disp: 24 tablet, Rfl: 3 .  Multiple Vitamin (MULTIVITAMIN) tablet, Take 1 tablet by mouth at bedtime. , Disp: , Rfl:  .   pantoprazole (PROTONIX) 40 MG tablet, TAKE 1 TABLET BY MOUTH EVERY DAY (Patient taking differently: Take 40 mg by mouth daily.), Disp: 90 tablet, Rfl: 2 .  potassium chloride SA (KLOR-CON M20) 20 MEQ tablet, Take 1 tablet (20 mEq total) by mouth daily. (Patient taking differently: Take 20 mEq by mouth daily. He is taking an extra 20 meq with metolazone), Disp: 30 tablet, Rfl: 3 .  sucralfate (CARAFATE) 1 g tablet, Take 1 tablet (1 g total) by mouth 2 (two) times daily., Disp: , Rfl:  .  torsemide (DEMADEX) 100 MG tablet, Take 1 tablet (100 mg total) by mouth daily., Disp: 30 tablet, Rfl: 3   Family History  Problem Relation Age of Onset  . Valvular heart disease Mother        Ruptured valve  . CAD Father   . Heart Problems Brother        Stents x 4  . Diabetes Brother   . Prostate cancer Neg Hx   . Bladder Cancer Neg Hx   . Kidney cancer Neg Hx      Social History   Tobacco Use  . Smoking status: Former Smoker    Quit date: 2014    Years since quitting: 7.9  . Smokeless tobacco: Never Used  . Tobacco comment: 07/30/2017 Quit in 2014  Vaping Use  . Vaping Use: Never used  Substance Use Topics  . Alcohol use: No    Alcohol/week: 0.0 standard drinks  . Drug use: No    Allergies as of 08/02/2020 - Review Complete 08/02/2020  Allergen Reaction Noted  . Spironolactone Other (See Comments) 02/20/2017    Review of Systems:    All systems reviewed and negative except where noted in HPI.   Physical Exam:  Vital signs in last 24 hours: Temp:  [97.5 F (36.4 C)-98.8 F (37.1 C)] 98.6 F (37 C) (12/10 1330) Pulse Rate:  [61-75] 75 (12/10 1545) Resp:  [13-24] 16 (12/10 1545) BP: (67-119)/(40-95) 99/59 (12/10 1545) SpO2:  [90 %-100 %] 97 % (12/10 1545) Weight:  [72.6 kg] 72.6 kg (12/09 1915)   General: Lethargic, not in distress Head:  Normocephalic and atraumatic. Eyes:   No icterus.   Conjunctiva pale. PERRLA. Ears:  Normal auditory acuity. Neck:  Supple; no masses or  thyroidomegaly Lungs: Respirations even and unlabored. Lungs clear to auscultation bilaterally.   No wheezes, crackles, or rhonchi.  Heart:  Regular rate and rhythm;  Without murmur, clicks, rubs or gallops Abdomen:  Soft, nondistended, nontender. Normal bowel sounds. No appreciable masses or hepatomegaly.  No rebound or guarding.  Rectal:  Not performed. Msk:  Symmetrical without gross deformities.   Extremities:  Without edema, cyanosis or clubbing. Neurologic:  Alert and oriented x0;  grossly normal neurologically. Skin:  Intact without significant lesions or rashes. Psych:  Alert and cooperative. Normal affect.  LAB RESULTS: CBC Latest Ref Rng & Units 08/03/2020 08/03/2020 08/03/2020  WBC 4.0 - 10.5 K/uL - - -  Hemoglobin 13.0 - 17.0 g/dL 8.4(L) 6.8(L) 7.9(L)  Hematocrit 39.0 - 52.0 % 25.9(L) 21.4(L) 24.2(L)  Platelets 150 - 400 K/uL - - -    BMET BMP Latest Ref Rng & Units 08/03/2020 08/02/2020 08/02/2020  Glucose 70 - 99 mg/dL 121(H) 125(H) 129(H)  BUN 8 - 23 mg/dL 89(H) 82(H) 72(H)  Creatinine 0.61 - 1.24 mg/dL 2.97(H) 2.91(H) 2.91(H)  BUN/Creat Ratio 10 - 24 - - -  Sodium 135 - 145 mmol/L 130(L) 131(L) 130(L)  Potassium 3.5 - 5.1 mmol/L 2.9(L) 3.0(L) 2.6(LL)  Chloride 98 - 111 mmol/L 88(L) 87(L) 86(L)  CO2 22 - 32 mmol/L 28 30 32  Calcium 8.9 - 10.3 mg/dL 8.6(L) 8.8(L) 9.1    LFT Hepatic Function Latest Ref Rng & Units 08/03/2020 08/02/2020 06/27/2020  Total Protein 6.5 - 8.1 g/dL 5.9(L) 6.2(L) 6.1(L)  Albumin 3.5 - 5.0 g/dL 3.0(L) 3.1(L) 3.3(L)  AST 15 - 41 U/L 46(H) 49(H) 33  ALT 0 - 44 U/L 30 32 22  Alk Phosphatase 38 - 126 U/L 110 114 83  Total Bilirubin 0.3 - 1.2 mg/dL 1.7(H) 1.8(H) 1.3(H)  Bilirubin, Direct 0.0 - 0.2 mg/dL - 0.7(H) -     STUDIES: DG Lumbar Spine 2-3 Views  Result Date: 08/02/2020 CLINICAL DATA:  Fall EXAM: LUMBAR SPINE - 2-3 VIEW COMPARISON:  CT 06/20/2019 FINDINGS: Lumbar alignment within normal limits. Vertebral body heights are maintained.  Mild degenerative changes at L2-L3. Facet degenerative changes of the lower lumbar spine. Aortic atherosclerosis. IMPRESSION: No acute osseous abnormality. Electronically Signed   By: Donavan Foil M.D.   On: 08/02/2020 21:10   CT Head Wo Contrast  Result Date: 08/02/2020 CLINICAL DATA:  Fall EXAM: CT HEAD WITHOUT CONTRAST TECHNIQUE: Contiguous axial images were obtained from the base of the skull through the vertex without intravenous contrast. COMPARISON:  October 16, 2018 FINDINGS: Brain: No evidence of acute territorial infarction, hemorrhage, hydrocephalus,extra-axial collection or mass lesion/mass effect. There is dilatation the ventricles and sulci consistent with age-related atrophy. Low-attenuation changes in the deep white matter consistent with small vessel ischemia. Vascular: No hyperdense vessel or unexpected calcification. Skull: The skull is intact. No fracture or focal lesion identified. Sinuses/Orbits: Small amount of fluid seen within the sphenoid sinuses. The orbits and globes intact. Other: None Cervical spine: Alignment: There is a grade 1 anterolisthesis of C4-C5 measuring 3 mm. Skull base and vertebrae: Visualized skull base is intact. No atlanto-occipital dissociation. The vertebral body heights are well maintained. No fracture or pathologic osseous lesion seen. Soft tissues and spinal canal: The visualized paraspinal soft tissues are unremarkable. No prevertebral soft tissue swelling is seen. The spinal canal is grossly unremarkable, no large epidural collection or significant canal narrowing. Disc levels: Cervical spine spondylosis is seen disc osteophyte uncovertebral osteophytes most notable C3-C4 with  severe left neural foraminal narrowing and central canal stenosis. Upper chest: Biapical scarring is seen. Thoracic inlet is within normal limits. Other: None IMPRESSION: No acute intracranial abnormality. Findings consistent with age related atrophy and chronic small vessel ischemia  No acute fracture or malalignment of the spine. Stable grade 1 anterolisthesis C4 on C5. Cervical spine spondylosis most notable at C3-C4. Electronically Signed   By: Prudencio Pair M.D.   On: 08/02/2020 21:06   CT Cervical Spine Wo Contrast  Result Date: 08/02/2020 CLINICAL DATA:  Fall EXAM: CT HEAD WITHOUT CONTRAST TECHNIQUE: Contiguous axial images were obtained from the base of the skull through the vertex without intravenous contrast. COMPARISON:  October 16, 2018 FINDINGS: Brain: No evidence of acute territorial infarction, hemorrhage, hydrocephalus,extra-axial collection or mass lesion/mass effect. There is dilatation the ventricles and sulci consistent with age-related atrophy. Low-attenuation changes in the deep white matter consistent with small vessel ischemia. Vascular: No hyperdense vessel or unexpected calcification. Skull: The skull is intact. No fracture or focal lesion identified. Sinuses/Orbits: Small amount of fluid seen within the sphenoid sinuses. The orbits and globes intact. Other: None Cervical spine: Alignment: There is a grade 1 anterolisthesis of C4-C5 measuring 3 mm. Skull base and vertebrae: Visualized skull base is intact. No atlanto-occipital dissociation. The vertebral body heights are well maintained. No fracture or pathologic osseous lesion seen. Soft tissues and spinal canal: The visualized paraspinal soft tissues are unremarkable. No prevertebral soft tissue swelling is seen. The spinal canal is grossly unremarkable, no large epidural collection or significant canal narrowing. Disc levels: Cervical spine spondylosis is seen disc osteophyte uncovertebral osteophytes most notable C3-C4 with severe left neural foraminal narrowing and central canal stenosis. Upper chest: Biapical scarring is seen. Thoracic inlet is within normal limits. Other: None IMPRESSION: No acute intracranial abnormality. Findings consistent with age related atrophy and chronic small vessel ischemia No acute  fracture or malalignment of the spine. Stable grade 1 anterolisthesis C4 on C5. Cervical spine spondylosis most notable at C3-C4. Electronically Signed   By: Prudencio Pair M.D.   On: 08/02/2020 21:06      Impression / Plan:   JENNIFER HOLLAND is a 69 y.o. male with history of hypertension, CKD, severe cardiomyopathy EF of 20%, s/p AICD, history of A. fib on Eliquis, chronic iron deficiency anemia who presented after a fall, found to have acute on chronic anemia and reported history of dark stool  Acute on chronic iron deficiency anemia Well compensated cirrhosis Patient is hemodynamically stable and responded appropriately to blood transfusion His last iron infusion was on 12/6 Recheck iron studies, recommend another dose of IV iron, Venofer Continue to hold Eliquis, last dose was on 12/9 AM Patient underwent several bidirectional endoscopy procedures, with a capsule endoscopy in 2016, with no clear etiology identified.  Given that he had EGD and colonoscopy less than 2 weeks ago, recommend video capsule endoscopy today Okay to resume diet after capsule is placed Continue Protonix drip for now.  If video capsule endoscopy is unremarkable, can perform EGD after interruption of Eliquis at least for 48 to 72 hours  Thank you for involving me in the care of this patient.  Dr. Vicente Males will cover for the weekend    LOS: 1 day   Sherri Sear, MD  08/03/2020, 4:14 PM   Note: This dictation was prepared with Dragon dictation along with smaller phrase technology. Any transcriptional errors that result from this process are unintentional.

## 2020-08-03 NOTE — ED Notes (Signed)
E Ouma, NP-C notified of urinary retention and dark black stool patient passed

## 2020-08-03 NOTE — ED Notes (Signed)
ENDO RN AT BEDSIDE FOR GIVENS CAPSULE STUDY

## 2020-08-03 NOTE — ED Notes (Signed)
Pt has had a 3rd BM since arrival to ED -- moderate amount of dark black stool; soft in consistency-- pericare with bath wipes performed.  Indwelling foley remains patent draining dark yellow urine via gravity.  NP-C E Ouma made aware of a.m. K+2.9, hgB 6.9 and Na+130

## 2020-08-03 NOTE — Progress Notes (Signed)
[facility] Liaison note:  This patient is currently followed by [facility] outpatient Palliative services at home. TOC Adelene Amas and Palliative NP Ihor Dow notified. Liaison will follow for disposition.  Flo Shanks BSN, RN, [place] [phone]

## 2020-08-03 NOTE — ED Notes (Signed)
Pt awake and alert - speech garbled due to dentures not being worn.  Brother at bedside able to provide pt history.  Pt requests to use bedpan at this time however after being placed on bedpan reports he does not have to go -- when pericare performed however scant amount of dark black stool noted on bath wipes.  Abdomen slightly rounded and slightly firm -- TTP with frequent flatulence observed. It also noted at this time that patient has small bruise to mid lower back and two small bruises to upper posterior thigh region.

## 2020-08-04 LAB — CBC WITH DIFFERENTIAL/PLATELET
Abs Immature Granulocytes: 0.07 10*3/uL (ref 0.00–0.07)
Basophils Absolute: 0 10*3/uL (ref 0.0–0.1)
Basophils Relative: 0 %
Eosinophils Absolute: 0.1 10*3/uL (ref 0.0–0.5)
Eosinophils Relative: 1 %
HCT: 26.6 % — ABNORMAL LOW (ref 39.0–52.0)
Hemoglobin: 8.7 g/dL — ABNORMAL LOW (ref 13.0–17.0)
Immature Granulocytes: 1 %
Lymphocytes Relative: 6 %
Lymphs Abs: 0.6 10*3/uL — ABNORMAL LOW (ref 0.7–4.0)
MCH: 30.7 pg (ref 26.0–34.0)
MCHC: 32.7 g/dL (ref 30.0–36.0)
MCV: 94 fL (ref 80.0–100.0)
Monocytes Absolute: 1 10*3/uL (ref 0.1–1.0)
Monocytes Relative: 11 %
Neutro Abs: 7.2 10*3/uL (ref 1.7–7.7)
Neutrophils Relative %: 81 %
Platelets: 217 10*3/uL (ref 150–400)
RBC: 2.83 MIL/uL — ABNORMAL LOW (ref 4.22–5.81)
RDW: 23.2 % — ABNORMAL HIGH (ref 11.5–15.5)
Smear Review: NORMAL
WBC: 8.9 10*3/uL (ref 4.0–10.5)
nRBC: 0.3 % — ABNORMAL HIGH (ref 0.0–0.2)

## 2020-08-04 LAB — COMPREHENSIVE METABOLIC PANEL
ALT: 30 U/L (ref 0–44)
AST: 52 U/L — ABNORMAL HIGH (ref 15–41)
Albumin: 2.9 g/dL — ABNORMAL LOW (ref 3.5–5.0)
Alkaline Phosphatase: 100 U/L (ref 38–126)
Anion gap: 13 (ref 5–15)
BUN: 88 mg/dL — ABNORMAL HIGH (ref 8–23)
CO2: 28 mmol/L (ref 22–32)
Calcium: 8.5 mg/dL — ABNORMAL LOW (ref 8.9–10.3)
Chloride: 92 mmol/L — ABNORMAL LOW (ref 98–111)
Creatinine, Ser: 2.89 mg/dL — ABNORMAL HIGH (ref 0.61–1.24)
GFR, Estimated: 23 mL/min — ABNORMAL LOW (ref >60)
Glucose, Bld: 123 mg/dL — ABNORMAL HIGH (ref 70–99)
Potassium: 2.6 mmol/L — CL (ref 3.5–5.1)
Sodium: 133 mmol/L — ABNORMAL LOW (ref 135–145)
Total Bilirubin: 2.4 mg/dL — ABNORMAL HIGH (ref 0.3–1.2)
Total Protein: 5.9 g/dL — ABNORMAL LOW (ref 6.5–8.1)

## 2020-08-04 LAB — PROTIME-INR
INR: 1.9 — ABNORMAL HIGH (ref 0.8–1.2)
Prothrombin Time: 20.9 seconds — ABNORMAL HIGH (ref 11.4–15.2)

## 2020-08-04 LAB — MAGNESIUM: Magnesium: 2.3 mg/dL (ref 1.7–2.4)

## 2020-08-04 MED ORDER — POTASSIUM CHLORIDE 10 MEQ/100ML IV SOLN
10.0000 meq | INTRAVENOUS | Status: AC
  Administered 2020-08-04 (×6): 10 meq via INTRAVENOUS
  Filled 2020-08-04 (×6): qty 100

## 2020-08-04 MED ORDER — POTASSIUM CHLORIDE CRYS ER 20 MEQ PO TBCR: 40.0000 meq | EXTENDED_RELEASE_TABLET | ORAL | Status: DC

## 2020-08-04 MED ORDER — POTASSIUM CHLORIDE CRYS ER 20 MEQ PO TBCR
40.0000 meq | EXTENDED_RELEASE_TABLET | ORAL | Status: AC
  Administered 2020-08-04 (×3): 40 meq via ORAL
  Filled 2020-08-04 (×3): qty 2

## 2020-08-04 NOTE — Progress Notes (Signed)
PROGRESS NOTE    Carl [name]  VZD:[id number] DOB: 1950-11-02 DOA: 08/02/2020 PCP: [name], MD   Brief Narrative:  HPI: Carl [name] is a 69 y.o. male with medical history significant for chronic systolic heart failure with most recent echocardiogram showing LVEF 20% status post AICD placement, paroxysmal atrial fibrillation chronically anticoagulated on Eliquis, stage IV chronic kidney disease with baseline creatinine 2.2-2.7, chronic blood loss anemia with baseline hemoglobin of 7-9, cirrhosis, who is admitted to [facility] on 08/02/2020 with acute on chronic blood loss anemia after presenting from home to [facility] Emergency Department for evaluation of ground-level mechanical fall.   Earlier today, ambulate to the car following dinner, the patient reports that he tripped and fell outside.  He did not believe that he hit his head as a component a loss of consciousness.  He reports that this was purely mechanical fall, noting that he tripped in the parking lot.  No preceding or subsequent chest pain.  He was encouraged by the party that he was approached the renal mechanical fall with and therefore presented to the emergency department for further evaluation.  Denies any headache, neck stiffness, vision, dizziness, or vertigo.  The patient reports that he is at his baseline is without complaint at this time.  He denies any shortness of breath, orthopnea, and worsening of his bilateral peripheral edema.  Denies any recent chest pain, palpitations, diaphoresis.  Denies any recent nausea or vomiting.  He acknowledges a history of chronic blood loss anemia in the setting of recurrent gastrointestinal bleeds, but has not recently noticed any melena or hematochezia.  He is chronically anticoagulated on Eliquis in the setting of paroxysmal atrial fibrillation.  Denies any use of NSAIDs.   He denies pain, diarrhea, or rash.  Dysuria, gross hematuria, or change in urinary  urgency/frequency.  In the setting of recurrent gastrointestinal bleeds, it appears that the patient underwent colonoscopy via Dr. [name] on 07/23/2020, as well as preceding EGD on 06/28/2020 via Dr. Watt Climes.  In the setting, the patient reports that his baseline systolic blood pressures about 90 mmHg.   ED Course:  Vital signs in the ED were notable for the following: Temperature max 97.9; heart rate 62-67; initial blood pressure 76/40, which increased to baseline value of 90/53 following a small interval normal saline bolus, as further modified below; respiratory rate 18-20, and oxygen saturation 96 to 99% on room air.  Labs were notable for the following: CMP was notable for the following: Sodium 131, potassium 3.0, creatinine 2.91 relative to baseline range of 2.2-2.7, with most recent prior creatinine noted to be 2.26 when checked on 07/12/2020 CBC was notable for the following White blood cell count of 11,000 relative to most recent prior white blood cell count of 4400 on 07/02/2020.  Hemoglobin 6.8, relative to his baseline range of 7-9, with most recent prior hemoglobin noted to be 8.3.  Today's hemoglobin value associated with elevated RDW, and platelets were noted to be 263.  Serum magnesium level 2.3.  Screening nasopharyngeal COVID-19/influenza PCR checked in the ED this evening found to be negative.  Noncontrast CT of the head showed no evidence of acute intracranial process, including no evidence of intracranial hemorrhage.  CT of the cervical spine showed no evidence of acute fracture or malalignment of the cervical spine.  While in the ED, the following were administered: Lactated Ringer bolus x200 cc.  Protonix drip initiated, and transfusion of 1 unit PRBC very slowly was ordered by the  emergency department physician.  Assessment & Plan:   Principal Problem:   Acute on chronic blood loss anemia Active Problems:   GERD (gastroesophageal reflux disease)   Hypokalemia   Chronic  systolic CHF (congestive heart failure) (HCC)   Cirrhosis (HCC)   Acute kidney injury superimposed on CKD (HCC)   Leukocytosis   Acute on chronic blood loss anemia due to possible upper GI bleed: Reportedly, patient had EGD as well as colonoscopy recently and both of them were unremarkable.  He also saw hematology oncology due to persistent anemia and was recommended weekly IV iron.  Patient yet once again presented with acute on chronic anemia with hemoglobin of 6.8.  He received 1 unit of PRBC transfusion on the night of 08/02/2020.  Hemoglobin improved to 7.9 and dropped again to 6.8.  On the morning of 08/03/2020 and he received another unit of transfusion.  Hemoglobin has remained stable since then.  Continue on Protonix drip.  GI on board.  Plan for EGD at some point in time, timing per GI.  Undergoing capsule endoscopy.  H&H frequently.  Persistent hypotension: Patient blood pressure remains low persistently however he remains asymptomatic and lactic acid normal.  Perhaps this is his baseline due to known liver cirrhosis.    Acute kidney injury on chronic kidney disease stage IIIb: Could very well be due to upper GI bleed and dehydration is well as.only slight improvement in creatinine.  Unable to provide further IV hydration due to known history of severely decreased ejection fraction.  Monitor daily.  Ground-level mechanical fall: Likely due to weakness secondary to anemia.  PT OT consulted.  Hypokalemia: 2.6 again.  Will replace aggressively.  Recheck in the morning.  Leukocytosis: Nonspecific and unremarkable which resolved.  No signs of infection.   Chronic systolic heart failure: with most recent echocardiogram showing evidence of left ventricular ejection fraction of 20%, status post AICD.  He is on torsemide 100 mg p.o. daily.  In addition to Coreg.  He appears euvolemic so we will hold torsemide in the setting of low blood pressure and elevated creatinine  Paroxysmal atrial  fibrillation: Chronically anticoagulated on Eliquis which is on hold due to GI bleed as above.  Continue amiodarone and lower dose of carvedilol.  Liver cirrhosis: Compensated.  Generalized weakness/deconditioning/multiple medical problems: Seen by palliative care.  Not interested in hospice.  He sees outpatient palliative care.  Remains DNR.  DVT prophylaxis: SCDs Start: 08/03/20 0049   Code Status: DNR  Family Communication: None present at bedside today.  Status is: Inpatient  Remains inpatient appropriate because:Inpatient level of care appropriate due to severity of illness   Dispo: The patient is from: Home              Anticipated d/c is to: Home              Anticipated d/c date is: 2 days              Patient currently is not medically stable to d/c.    Estimated body mass index is 20.08 kg/m as calculated from the following:   Height as of this encounter: 6\' 1"  (1.854 m).   Weight as of this encounter: 69 kg.  Pressure Injury 06/28/20 Hip Left Stage 1 -  Intact skin with non-blanchable redness of a localized area usually over a bony prominence. reddened spot, partially blanchable (Active)  06/28/20 2000  Location: Hip  Location Orientation: Left  Staging: Stage 1 -  Intact skin with  non-blanchable redness of a localized area usually over a bony prominence.  Wound Description (Comments): reddened spot, partially blanchable  Present on Admission:      Pressure Injury 06/28/20 Sacrum Mid Stage 1 -  Intact skin with non-blanchable redness of a localized area usually over a bony prominence. circular, quarter sized (Active)  06/28/20 2000  Location: Sacrum  Location Orientation: Mid  Staging: Stage 1 -  Intact skin with non-blanchable redness of a localized area usually over a bony prominence.  Wound Description (Comments): circular, quarter sized  Present on Admission:      Nutritional status:               Consultants:   GI  Procedures:    None  Antimicrobials:  Anti-infectives (From admission, onward)   Start     Dose/Rate Route Frequency Ordered Stop   08/03/20 0100  ciprofloxacin (CIPRO) IVPB 400 mg  Status:  Discontinued        400 mg 200 mL/hr over 60 Minutes Intravenous Every 24 hours 08/03/20 0056 08/03/20 0806         Subjective: Patient seen and examined this morning.  He was alert and partially oriented.  Perhaps at his baseline.  Very hard of hearing.  Denied any complaint.  No family present at bedside.  Objective: Vitals:   08/03/20 2342 08/04/20 0300 08/04/20 0353 08/04/20 1129  BP: 109/67 105/65 (!) 93/53 103/60  Pulse: 66 65 71 67  Resp: 16 12 16 20   Temp: (!) 96.7 F (35.9 C)  (!) 97.1 F (36.2 C) 98.4 F (36.9 C)  TempSrc: Axillary  Axillary   SpO2: 96% 95% 93% 96%  Weight:   69 kg   Height:        Intake/Output Summary (Last 24 hours) at 08/04/2020 1336 Last data filed at 08/04/2020 0401 Gross per 24 hour  Intake 515.59 ml  Output 2450 ml  Net -1934.41 ml   Filed Weights   08/02/20 1915 08/04/20 0353  Weight: 72.6 kg 69 kg    Examination:  General exam: Appears calm and comfortable  Respiratory system: Clear to auscultation. Respiratory effort normal. Cardiovascular system: S1 & S2 heard, RRR. No JVD, murmurs, rubs, gallops or clicks. No pedal edema. Gastrointestinal system: Abdomen is nondistended, soft and nontender. No organomegaly or masses felt. Normal bowel sounds heard. Central nervous system: Alert and oriented x2. No focal neurological deficits. Extremities: Symmetric 5 x 5 power. Skin: No rashes, lesions or ulcers.  Psychiatry: Judgement and insight appear poor. Mood & affect appropriate.    Data Reviewed: I have personally reviewed following labs and imaging studies  CBC: Recent Labs  Lab 08/02/20 2116 08/03/20 0421 08/03/20 0922 08/03/20 1235 08/03/20 1520 08/03/20 2042 08/04/20 0539  WBC 11.0* 8.1  --   --   --   --  8.9  NEUTROABS 9.2* 6.6  --    --   --   --  7.2  HGB 6.8* 6.9* 7.9* 6.8* 8.4* 8.4* 8.7*  HCT 21.2* 21.6* 24.2* 21.4* 25.9* 26.2* 26.6*  MCV 100.0 97.7  --   --   --   --  94.0  PLT 263 235  --   --   --   --  030   Basic Metabolic Panel: Recent Labs  Lab 08/02/20 1424 08/02/20 2116 08/03/20 0421 08/04/20 0539  NA 130* 131* 130* 133*  K 2.6* 3.0* 2.9* 2.6*  CL 86* 87* 88* 92*  CO2 32 30 28 28   GLUCOSE 129*  125* 121* 123*  BUN 72* 82* 89* 88*  CREATININE 2.91* 2.91* 2.97* 2.89*  CALCIUM 9.1 8.8* 8.6* 8.5*  MG  --  2.3 2.2 2.3  PHOS  --   --  3.3  --    GFR: Estimated Creatinine Clearance: 23.5 mL/min (A) (by C-G formula based on SCr of 2.89 mg/dL (H)). Liver Function Tests: Recent Labs  Lab 08/02/20 2116 08/03/20 0421 08/04/20 0539  AST 49* 46* 52*  ALT 32 30 30  ALKPHOS 114 110 100  BILITOT 1.8* 1.7* 2.4*  PROT 6.2* 5.9* 5.9*  ALBUMIN 3.1* 3.0* 2.9*   No results for input(s): LIPASE, AMYLASE in the last 168 hours. No results for input(s): AMMONIA in the last 168 hours. Coagulation Profile: Recent Labs  Lab 08/03/20 0421 08/04/20 0539  INR 2.4* 1.9*   Cardiac Enzymes: No results for input(s): CKTOTAL, CKMB, CKMBINDEX, TROPONINI in the last 168 hours. BNP (last 3 results) No results for input(s): PROBNP in the last 8760 hours. HbA1C: No results for input(s): HGBA1C in the last 72 hours. CBG: No results for input(s): GLUCAP in the last 168 hours. Lipid Profile: No results for input(s): CHOL, HDL, LDLCALC, TRIG, CHOLHDL, LDLDIRECT in the last 72 hours. Thyroid Function Tests: No results for input(s): TSH, T4TOTAL, FREET4, T3FREE, THYROIDAB in the last 72 hours. Anemia Panel: No results for input(s): VITAMINB12, FOLATE, FERRITIN, TIBC, IRON, RETICCTPCT in the last 72 hours. Sepsis Labs: Recent Labs  Lab 08/03/20 1520  LATICACIDVEN 1.4    Recent Results (from the past 240 hour(s))  Resp Panel by RT-PCR (Flu A&B, Covid) Nasopharyngeal Swab     Status: None   Collection Time: 08/02/20  10:54 PM   Specimen: Nasopharyngeal Swab; Nasopharyngeal(NP) swabs in vial transport medium  Result Value Ref Range Status   SARS Coronavirus 2 by RT PCR NEGATIVE NEGATIVE Final    Comment: (NOTE) SARS-CoV-2 target nucleic acids are NOT DETECTED.  The SARS-CoV-2 RNA is generally detectable in upper respiratory specimens during the acute phase of infection. The lowest concentration of SARS-CoV-2 viral copies this assay can detect is 138 copies/mL. A negative result does not preclude SARS-Cov-2 infection and should not be used as the sole basis for treatment or other patient management decisions. A negative result may occur with  improper specimen collection/handling, submission of specimen other than nasopharyngeal swab, presence of viral mutation(s) within the areas targeted by this assay, and inadequate number of viral copies(<138 copies/mL). A negative result must be combined with clinical observations, patient history, and epidemiological information. The expected result is Negative.  Fact Sheet for Patients:  EntrepreneurPulse.com.au  Fact Sheet for Healthcare Providers:  IncredibleEmployment.be  This test is no t yet approved or cleared by the [place] FDA and  has been authorized for detection and/or diagnosis of SARS-CoV-2 by FDA under an Emergency Use Authorization (EUA). This EUA will remain  in effect (meaning this test can be used) for the duration of the COVID-19 declaration under Section 564(b)(1) of the Act, 21 U.S.C.section 360bbb-3(b)(1), unless the authorization is terminated  or revoked sooner.       Influenza A by PCR NEGATIVE NEGATIVE Final   Influenza B by PCR NEGATIVE NEGATIVE Final    Comment: (NOTE) The Xpert Xpress SARS-CoV-2/FLU/RSV plus assay is intended as an aid in the diagnosis of influenza from Nasopharyngeal swab specimens and should not be used as a sole basis for treatment. Nasal washings and aspirates  are unacceptable for Xpert Xpress SARS-CoV-2/FLU/RSV testing.  Fact Sheet for Patients:  EntrepreneurPulse.com.au  Fact Sheet for Healthcare Providers: IncredibleEmployment.be  This test is not yet approved or cleared by the [place] FDA and has been authorized for detection and/or diagnosis of SARS-CoV-2 by FDA under an Emergency Use Authorization (EUA). This EUA will remain in effect (meaning this test can be used) for the duration of the COVID-19 declaration under Section 564(b)(1) of the Act, 21 U.S.C. section 360bbb-3(b)(1), unless the authorization is terminated or revoked.  Performed at [facility], [address], [place], [place] [address]       Radiology Studies: DG Lumbar Spine 2-3 Views  Result Date: 08/02/2020 CLINICAL DATA:  Fall EXAM: LUMBAR SPINE - 2-3 VIEW COMPARISON:  CT 06/20/2019 FINDINGS: Lumbar alignment within normal limits. Vertebral body heights are maintained. Mild degenerative changes at L2-L3. Facet degenerative changes of the lower lumbar spine. Aortic atherosclerosis. IMPRESSION: No acute osseous abnormality. Electronically Signed   By: Donavan Foil M.D.   On: 08/02/2020 21:10   CT Head Wo Contrast  Result Date: 08/02/2020 CLINICAL DATA:  Fall EXAM: CT HEAD WITHOUT CONTRAST TECHNIQUE: Contiguous axial images were obtained from the base of the skull through the vertex without intravenous contrast. COMPARISON:  October 16, 2018 FINDINGS: Brain: No evidence of acute territorial infarction, hemorrhage, hydrocephalus,extra-axial collection or mass lesion/mass effect. There is dilatation the ventricles and sulci consistent with age-related atrophy. Low-attenuation changes in the deep white matter consistent with small vessel ischemia. Vascular: No hyperdense vessel or unexpected calcification. Skull: The skull is intact. No fracture or focal lesion identified. Sinuses/Orbits: Small amount of fluid seen within  the sphenoid sinuses. The orbits and globes intact. Other: None Cervical spine: Alignment: There is a grade 1 anterolisthesis of C4-C5 measuring 3 mm. Skull base and vertebrae: Visualized skull base is intact. No atlanto-occipital dissociation. The vertebral body heights are well maintained. No fracture or pathologic osseous lesion seen. Soft tissues and spinal canal: The visualized paraspinal soft tissues are unremarkable. No prevertebral soft tissue swelling is seen. The spinal canal is grossly unremarkable, no large epidural collection or significant canal narrowing. Disc levels: Cervical spine spondylosis is seen disc osteophyte uncovertebral osteophytes most notable C3-C4 with severe left neural foraminal narrowing and central canal stenosis. Upper chest: Biapical scarring is seen. Thoracic inlet is within normal limits. Other: None IMPRESSION: No acute intracranial abnormality. Findings consistent with age related atrophy and chronic small vessel ischemia No acute fracture or malalignment of the spine. Stable grade 1 anterolisthesis C4 on C5. Cervical spine spondylosis most notable at C3-C4. Electronically Signed   By: Prudencio Pair M.D.   On: 08/02/2020 21:06   CT Cervical Spine Wo Contrast  Result Date: 08/02/2020 CLINICAL DATA:  Fall EXAM: CT HEAD WITHOUT CONTRAST TECHNIQUE: Contiguous axial images were obtained from the base of the skull through the vertex without intravenous contrast. COMPARISON:  October 16, 2018 FINDINGS: Brain: No evidence of acute territorial infarction, hemorrhage, hydrocephalus,extra-axial collection or mass lesion/mass effect. There is dilatation the ventricles and sulci consistent with age-related atrophy. Low-attenuation changes in the deep white matter consistent with small vessel ischemia. Vascular: No hyperdense vessel or unexpected calcification. Skull: The skull is intact. No fracture or focal lesion identified. Sinuses/Orbits: Small amount of fluid seen within the  sphenoid sinuses. The orbits and globes intact. Other: None Cervical spine: Alignment: There is a grade 1 anterolisthesis of C4-C5 measuring 3 mm. Skull base and vertebrae: Visualized skull base is intact. No atlanto-occipital dissociation. The vertebral body heights are well maintained. No fracture or pathologic osseous lesion  seen. Soft tissues and spinal canal: The visualized paraspinal soft tissues are unremarkable. No prevertebral soft tissue swelling is seen. The spinal canal is grossly unremarkable, no large epidural collection or significant canal narrowing. Disc levels: Cervical spine spondylosis is seen disc osteophyte uncovertebral osteophytes most notable C3-C4 with severe left neural foraminal narrowing and central canal stenosis. Upper chest: Biapical scarring is seen. Thoracic inlet is within normal limits. Other: None IMPRESSION: No acute intracranial abnormality. Findings consistent with age related atrophy and chronic small vessel ischemia No acute fracture or malalignment of the spine. Stable grade 1 anterolisthesis C4 on C5. Cervical spine spondylosis most notable at C3-C4. Electronically Signed   By: Prudencio Pair M.D.   On: 08/02/2020 21:06    Scheduled Meds: . sodium chloride   Intravenous Once  . allopurinol  100 mg Oral Daily  . amiodarone  200 mg Oral Daily  . ascorbic acid  250 mg Oral Daily  . Chlorhexidine Gluconate Cloth  6 each Topical Daily  . DULoxetine  30 mg Oral QHS  . [START ON 08/06/2020] pantoprazole  40 mg Intravenous Q12H  . potassium chloride  40 mEq Oral Q4H  . sucralfate  1 g Oral BID  . vitamin B-12  500 mcg Oral Daily   Continuous Infusions: . pantoprozole (PROTONIX) infusion 8 mg/hr (08/04/20 0722)  . potassium chloride 10 mEq (08/04/20 1225)     LOS: 2 days   Time spent: 30 minutes   Darliss Cheney, MD [facility]  08/04/2020, 1:36 PM   To contact the attending provider between 7A-7P or the covering provider during after hours 7P-7A,  please log into the web site [email or web address].

## 2020-08-04 NOTE — Plan of Care (Addendum)
Pt A/O x4, forgetful and hard to understand, whisper voice. Due to fatigue and difficulty understanding admission questions cannot be completed at this time. Received 1 unit of blood in the ED, recheck of labs showed hemoglobin to be 8.4 Pt undergoing capsule study overnight. Bruising and abrasions noted from recent fall prior to admission. Orders for bedrest with bathroom privileges. Foley in place without complications. Call bell within reach, will continue with the current plan of care.     Critical K of 2.6 today. E. Ouma notified, See new orders. Waiting on med from pharmacy. Day shift nurse aware of lab result. 08/04/20    0745

## 2020-08-04 NOTE — Progress Notes (Signed)
PT Cancellation Note  Patient Details Name: CABELL LAZENBY MRN: [id number] DOB: [birth year]   Cancelled Treatment:    Reason Eval/Treat Not Completed: Medical issues which prohibited therapy.  Medical issues which prohibited therapy. Chart reviewed - pt noted to have K+ critically low at 2.6; contraindicated for exertional activity at this time. Will continue to follow and initiate services as pt medically appropriate to participate in therapy.   Gwenlyn Saran, PT, DPT 08/04/20, 3:20 PM

## 2020-08-04 NOTE — Progress Notes (Signed)
OT Cancellation Note  Patient Details Name: Carl [name] MRN: [id number] DOB: 18-Nov-1950   Cancelled Treatment:    Reason Eval/Treat Not Completed: Medical issues which prohibited therapy. Chart reviewed - pt noted to have K+ critically low at 2.6; contraindicated for exertional activity at this time. Will continue to follow and initiate services as pt medically appropriate to participate in therapy.   Dessie Coma, M.S. OTR/L  08/04/20, 8:19 AM  ascom [phone]

## 2020-08-05 DIAGNOSIS — K922 Gastrointestinal hemorrhage, unspecified: Secondary | ICD-10-CM

## 2020-08-05 LAB — CBC WITH DIFFERENTIAL/PLATELET
Abs Immature Granulocytes: 0.13 10*3/uL — ABNORMAL HIGH (ref 0.00–0.07)
Basophils Absolute: 0 10*3/uL (ref 0.0–0.1)
Basophils Relative: 0 %
Eosinophils Absolute: 0.2 10*3/uL (ref 0.0–0.5)
Eosinophils Relative: 2 %
HCT: 26.3 % — ABNORMAL LOW (ref 39.0–52.0)
Hemoglobin: 8.6 g/dL — ABNORMAL LOW (ref 13.0–17.0)
Immature Granulocytes: 1 %
Lymphocytes Relative: 7 %
Lymphs Abs: 0.8 10*3/uL (ref 0.7–4.0)
MCH: 31 pg (ref 26.0–34.0)
MCHC: 32.7 g/dL (ref 30.0–36.0)
MCV: 94.9 fL (ref 80.0–100.0)
Monocytes Absolute: 1.1 10*3/uL — ABNORMAL HIGH (ref 0.1–1.0)
Monocytes Relative: 9 %
Neutro Abs: 9.3 10*3/uL — ABNORMAL HIGH (ref 1.7–7.7)
Neutrophils Relative %: 81 %
Platelets: 240 10*3/uL (ref 150–400)
RBC: 2.77 MIL/uL — ABNORMAL LOW (ref 4.22–5.81)
RDW: 23.3 % — ABNORMAL HIGH (ref 11.5–15.5)
Smear Review: NORMAL
WBC: 11.5 10*3/uL — ABNORMAL HIGH (ref 4.0–10.5)
nRBC: 0.6 % — ABNORMAL HIGH (ref 0.0–0.2)

## 2020-08-05 LAB — COMPREHENSIVE METABOLIC PANEL
ALT: 33 U/L (ref 0–44)
AST: 54 U/L — ABNORMAL HIGH (ref 15–41)
Albumin: 3 g/dL — ABNORMAL LOW (ref 3.5–5.0)
Alkaline Phosphatase: 112 U/L (ref 38–126)
Anion gap: 10 (ref 5–15)
BUN: 78 mg/dL — ABNORMAL HIGH (ref 8–23)
CO2: 25 mmol/L (ref 22–32)
Calcium: 9 mg/dL (ref 8.9–10.3)
Chloride: 95 mmol/L — ABNORMAL LOW (ref 98–111)
Creatinine, Ser: 2.42 mg/dL — ABNORMAL HIGH (ref 0.61–1.24)
GFR, Estimated: 28 mL/min — ABNORMAL LOW (ref >60)
Glucose, Bld: 123 mg/dL — ABNORMAL HIGH (ref 70–99)
Potassium: 4.3 mmol/L (ref 3.5–5.1)
Sodium: 130 mmol/L — ABNORMAL LOW (ref 135–145)
Total Bilirubin: 2.4 mg/dL — ABNORMAL HIGH (ref 0.3–1.2)
Total Protein: 6.3 g/dL — ABNORMAL LOW (ref 6.5–8.1)

## 2020-08-05 LAB — PROTIME-INR
INR: 1.6 — ABNORMAL HIGH (ref 0.8–1.2)
Prothrombin Time: 18.4 seconds — ABNORMAL HIGH (ref 11.4–15.2)

## 2020-08-05 LAB — TYPE AND SCREEN
ABO/RH(D): O POS
Antibody Screen: NEGATIVE
Unit division: 0
Unit division: 0
Unit division: 0

## 2020-08-05 LAB — BPAM RBC
Blood Product Expiration Date: 202201112359
Blood Product Expiration Date: 202201112359
Blood Product Expiration Date: 202201112359
ISSUE DATE / TIME: 202112100238
ISSUE DATE / TIME: 202112101307
Unit Type and Rh: 5100
Unit Type and Rh: 5100
Unit Type and Rh: 5100

## 2020-08-05 LAB — PREPARE RBC (CROSSMATCH)

## 2020-08-05 NOTE — Progress Notes (Signed)
PROGRESS NOTE    Carl [name]  UYQ:[id number] DOB: 28-Feb-1951 DOA: 08/02/2020 PCP: [name], MD   Brief Narrative:  HPI: Carl [name] is a 69 y.o. male with medical history significant for chronic systolic heart failure with most recent echocardiogram showing LVEF 20% status post AICD placement, paroxysmal atrial fibrillation chronically anticoagulated on Eliquis, stage IV chronic kidney disease with baseline creatinine 2.2-2.7, chronic blood loss anemia with baseline hemoglobin of 7-9, cirrhosis, who is admitted to [facility] on 08/02/2020 with acute on chronic blood loss anemia after presenting from home to [facility] Emergency Department for evaluation of ground-level mechanical fall.   Earlier today, ambulate to the car following dinner, the patient reports that he tripped and fell outside.  He did not believe that he hit his head as a component a loss of consciousness.  He reports that this was purely mechanical fall, noting that he tripped in the parking lot.  No preceding or subsequent chest pain.  He was encouraged by the party that he was approached the renal mechanical fall with and therefore presented to the emergency department for further evaluation.  Denies any headache, neck stiffness, vision, dizziness, or vertigo.  The patient reports that he is at his baseline is without complaint at this time.  He denies any shortness of breath, orthopnea, and worsening of his bilateral peripheral edema.  Denies any recent chest pain, palpitations, diaphoresis.  Denies any recent nausea or vomiting.  He acknowledges a history of chronic blood loss anemia in the setting of recurrent gastrointestinal bleeds, but has not recently noticed any melena or hematochezia.  He is chronically anticoagulated on Eliquis in the setting of paroxysmal atrial fibrillation.  Denies any use of NSAIDs.   He denies pain, diarrhea, or rash.  Dysuria, gross hematuria, or change in urinary  urgency/frequency.  In the setting of recurrent gastrointestinal bleeds, it appears that the patient underwent colonoscopy via Dr. [name] on 07/23/2020, as well as preceding EGD on 06/28/2020 via Dr. Watt Climes.  In the setting, the patient reports that his baseline systolic blood pressures about 90 mmHg.   ED Course:  Vital signs in the ED were notable for the following: Temperature max 97.9; heart rate 62-67; initial blood pressure 76/40, which increased to baseline value of 90/53 following a small interval normal saline bolus, as further modified below; respiratory rate 18-20, and oxygen saturation 96 to 99% on room air.  Labs were notable for the following: CMP was notable for the following: Sodium 131, potassium 3.0, creatinine 2.91 relative to baseline range of 2.2-2.7, with most recent prior creatinine noted to be 2.26 when checked on 07/12/2020 CBC was notable for the following White blood cell count of 11,000 relative to most recent prior white blood cell count of 4400 on 07/02/2020.  Hemoglobin 6.8, relative to his baseline range of 7-9, with most recent prior hemoglobin noted to be 8.3.  Today's hemoglobin value associated with elevated RDW, and platelets were noted to be 263.  Serum magnesium level 2.3.  Screening nasopharyngeal COVID-19/influenza PCR checked in the ED this evening found to be negative.  Noncontrast CT of the head showed no evidence of acute intracranial process, including no evidence of intracranial hemorrhage.  CT of the cervical spine showed no evidence of acute fracture or malalignment of the cervical spine.  While in the ED, the following were administered: Lactated Ringer bolus x200 cc.  Protonix drip initiated, and transfusion of 1 unit PRBC very slowly was ordered by the  emergency department physician.  Assessment & Plan:   Principal Problem:   Acute on chronic blood loss anemia Active Problems:   GERD (gastroesophageal reflux disease)   Hypokalemia   Chronic  systolic CHF (congestive heart failure) (HCC)   Cirrhosis (HCC)   Acute kidney injury superimposed on CKD (HCC)   Leukocytosis   Acute on chronic blood loss anemia due to possible upper GI bleed: Reportedly, patient had EGD as well as colonoscopy recently and both of them were unremarkable.  He also saw hematology oncology due to persistent anemia and was recommended weekly IV iron.  Patient yet once again presented with acute on chronic anemia with hemoglobin of 6.8.  He received 1 unit of PRBC transfusion on the night of 08/02/2020.  Hemoglobin improved to 7.9 and dropped again to 6.8.  On the morning of 08/03/2020 and he received another unit of transfusion.  Hemoglobin has remained stable since then.  Continue on Protonix drip.  GI on board.  Per GI note, capsule endoscopy indicates that he is likely bleeding from AVMs in the stomach and proximal part of the small intestine.  Distal part of the small intestine was not evaluated.  He is scheduled for push enteroscopy tomorrow morning.  Persistent hypotension: Patient's blood pressure has improved now.  Acute kidney injury on chronic kidney disease stage IIIb: Could very well be due to upper GI bleed and dehydration is well as.only slight improvement in creatinine.  Renal function back to baseline.  Ground-level mechanical fall: Likely due to weakness secondary to anemia.  PT OT consulted.  Hypokalemia: Resolved.  Leukocytosis: Nonspecific and unremarkable which resolved.  No signs of infection.   Chronic systolic heart failure: with most recent echocardiogram showing evidence of left ventricular ejection fraction of 20%, status post AICD.  He is on torsemide 100 mg p.o. daily.  In addition to Coreg.  He appears euvolemic so we will hold torsemide in the setting of low blood pressure and elevated creatinine  Paroxysmal atrial fibrillation: Chronically anticoagulated on Eliquis which is on hold due to GI bleed as above.  Continue amiodarone  and lower dose of carvedilol.  Liver cirrhosis: Compensated.  Generalized weakness/deconditioning/multiple medical problems: Seen by palliative care.  Not interested in hospice.  He sees outpatient palliative care.  Remains DNR.  DVT prophylaxis: SCDs Start: 08/03/20 0049   Code Status: DNR  Family Communication: None present at bedside today.  Status is: Inpatient  Remains inpatient appropriate because:Inpatient level of care appropriate due to severity of illness   Dispo: The patient is from: Home              Anticipated d/c is to: Home              Anticipated d/c date is: 2 days              Patient currently is not medically stable to d/c.    Estimated body mass index is 19.58 kg/m as calculated from the following:   Height as of this encounter: 6\' 1"  (1.854 m).   Weight as of this encounter: 67.3 kg.  Pressure Injury 06/28/20 Hip Left Stage 1 -  Intact skin with non-blanchable redness of a localized area usually over a bony prominence. reddened spot, partially blanchable (Active)  06/28/20 2000  Location: Hip  Location Orientation: Left  Staging: Stage 1 -  Intact skin with non-blanchable redness of a localized area usually over a bony prominence.  Wound Description (Comments): reddened spot, partially blanchable  Present on Admission:      Pressure Injury 06/28/20 Sacrum Mid Stage 1 -  Intact skin with non-blanchable redness of a localized area usually over a bony prominence. circular, quarter sized (Active)  06/28/20 2000  Location: Sacrum  Location Orientation: Mid  Staging: Stage 1 -  Intact skin with non-blanchable redness of a localized area usually over a bony prominence.  Wound Description (Comments): circular, quarter sized  Present on Admission:      Nutritional status:               Consultants:   GI  Procedures:   None  Antimicrobials:  Anti-infectives (From admission, onward)   Start     Dose/Rate Route Frequency Ordered Stop    08/03/20 0100  ciprofloxacin (CIPRO) IVPB 400 mg  Status:  Discontinued        400 mg 200 mL/hr over 60 Minutes Intravenous Every 24 hours 08/03/20 0056 08/03/20 0806         Subjective: Seen and examined.  Patient alert and oriented.  No complaints.  Objective: Vitals:   08/04/20 2300 08/05/20 0500 08/05/20 0800 08/05/20 1033  BP: 100/69 95/84 92/78  121/70  Pulse: 74 80 71 74  Resp: 20 18 18 18   Temp:  98 F (36.7 C)    TempSrc:  Oral    SpO2: 95% 100% 95% 96%  Weight:  67.3 kg    Height:        Intake/Output Summary (Last 24 hours) at 08/05/2020 1143 Last data filed at 08/05/2020 1050 Gross per 24 hour  Intake 1038.77 ml  Output 1900 ml  Net -861.23 ml   Filed Weights   08/02/20 1915 08/04/20 0353 08/05/20 0500  Weight: 72.6 kg 69 kg 67.3 kg    Examination:  General exam: Appears calm and comfortable  Respiratory system: Clear to auscultation. Respiratory effort normal. Cardiovascular system: S1 & S2 heard, RRR. No JVD, murmurs, rubs, gallops or clicks. No pedal edema. Gastrointestinal system: Abdomen is nondistended, soft and nontender. No organomegaly or masses felt. Normal bowel sounds heard. Central nervous system: Alert and oriented. No focal neurological deficits. Extremities: Symmetric 5 x 5 power. Skin: No rashes, lesions or ulcers.  Psychiatry: Judgement and insight appear poor. Mood & affect flat   Data Reviewed: I have personally reviewed following labs and imaging studies  CBC: Recent Labs  Lab 08/02/20 2116 08/03/20 0421 08/03/20 0922 08/03/20 1235 08/03/20 1520 08/03/20 2042 08/04/20 0539 08/05/20 0440  WBC 11.0* 8.1  --   --   --   --  8.9 11.5*  NEUTROABS 9.2* 6.6  --   --   --   --  7.2 9.3*  HGB 6.8* 6.9*   < > 6.8* 8.4* 8.4* 8.7* 8.6*  HCT 21.2* 21.6*   < > 21.4* 25.9* 26.2* 26.6* 26.3*  MCV 100.0 97.7  --   --   --   --  94.0 94.9  PLT 263 235  --   --   --   --  217 240   < > = values in this interval not displayed.   Basic  Metabolic Panel: Recent Labs  Lab 08/02/20 1424 08/02/20 2116 08/03/20 0421 08/04/20 0539 08/05/20 0440  NA 130* 131* 130* 133* 130*  K 2.6* 3.0* 2.9* 2.6* 4.3  CL 86* 87* 88* 92* 95*  CO2 32 30 28 28 25   GLUCOSE 129* 125* 121* 123* 123*  BUN 72* 82* 89* 88* 78*  CREATININE 2.91* 2.91* 2.97* 2.89* 2.42*  CALCIUM 9.1 8.8* 8.6* 8.5* 9.0  MG  --  2.3 2.2 2.3  --   PHOS  --   --  3.3  --   --    GFR: Estimated Creatinine Clearance: 27.4 mL/min (A) (by C-G formula based on SCr of 2.42 mg/dL (H)). Liver Function Tests: Recent Labs  Lab 08/02/20 2116 08/03/20 0421 08/04/20 0539 08/05/20 0440  AST 49* 46* 52* 54*  ALT 32 30 30 33  ALKPHOS 114 110 100 112  BILITOT 1.8* 1.7* 2.4* 2.4*  PROT 6.2* 5.9* 5.9* 6.3*  ALBUMIN 3.1* 3.0* 2.9* 3.0*   No results for input(s): LIPASE, AMYLASE in the last 168 hours. No results for input(s): AMMONIA in the last 168 hours. Coagulation Profile: Recent Labs  Lab 08/03/20 0421 08/04/20 0539 08/05/20 0440  INR 2.4* 1.9* 1.6*   Cardiac Enzymes: No results for input(s): CKTOTAL, CKMB, CKMBINDEX, TROPONINI in the last 168 hours. BNP (last 3 results) No results for input(s): PROBNP in the last 8760 hours. HbA1C: No results for input(s): HGBA1C in the last 72 hours. CBG: No results for input(s): GLUCAP in the last 168 hours. Lipid Profile: No results for input(s): CHOL, HDL, LDLCALC, TRIG, CHOLHDL, LDLDIRECT in the last 72 hours. Thyroid Function Tests: No results for input(s): TSH, T4TOTAL, FREET4, T3FREE, THYROIDAB in the last 72 hours. Anemia Panel: No results for input(s): VITAMINB12, FOLATE, FERRITIN, TIBC, IRON, RETICCTPCT in the last 72 hours. Sepsis Labs: Recent Labs  Lab 08/03/20 1520  LATICACIDVEN 1.4    Recent Results (from the past 240 hour(s))  Resp Panel by RT-PCR (Flu A&B, Covid) Nasopharyngeal Swab     Status: None   Collection Time: 08/02/20 10:54 PM   Specimen: Nasopharyngeal Swab; Nasopharyngeal(NP) swabs in vial  transport medium  Result Value Ref Range Status   SARS Coronavirus 2 by RT PCR NEGATIVE NEGATIVE Final    Comment: (NOTE) SARS-CoV-2 target nucleic acids are NOT DETECTED.  The SARS-CoV-2 RNA is generally detectable in upper respiratory specimens during the acute phase of infection. The lowest concentration of SARS-CoV-2 viral copies this assay can detect is 138 copies/mL. A negative result does not preclude SARS-Cov-2 infection and should not be used as the sole basis for treatment or other patient management decisions. A negative result may occur with  improper specimen collection/handling, submission of specimen other than nasopharyngeal swab, presence of viral mutation(s) within the areas targeted by this assay, and inadequate number of viral copies(<138 copies/mL). A negative result must be combined with clinical observations, patient history, and epidemiological information. The expected result is Negative.  Fact Sheet for Patients:  EntrepreneurPulse.com.au  Fact Sheet for Healthcare Providers:  IncredibleEmployment.be  This test is no t yet approved or cleared by the [place] FDA and  has been authorized for detection and/or diagnosis of SARS-CoV-2 by FDA under an Emergency Use Authorization (EUA). This EUA will remain  in effect (meaning this test can be used) for the duration of the COVID-19 declaration under Section 564(b)(1) of the Act, 21 U.S.C.section 360bbb-3(b)(1), unless the authorization is terminated  or revoked sooner.       Influenza A by PCR NEGATIVE NEGATIVE Final   Influenza B by PCR NEGATIVE NEGATIVE Final    Comment: (NOTE) The Xpert Xpress SARS-CoV-2/FLU/RSV plus assay is intended as an aid in the diagnosis of influenza from Nasopharyngeal swab specimens and should not be used as a sole basis for treatment. Nasal washings and aspirates are unacceptable for Xpert Xpress SARS-CoV-2/FLU/RSV testing.  Fact  Sheet  for Patients: EntrepreneurPulse.com.au  Fact Sheet for Healthcare Providers: IncredibleEmployment.be  This test is not yet approved or cleared by the [place] FDA and has been authorized for detection and/or diagnosis of SARS-CoV-2 by FDA under an Emergency Use Authorization (EUA). This EUA will remain in effect (meaning this test can be used) for the duration of the COVID-19 declaration under Section 564(b)(1) of the Act, 21 U.S.C. section 360bbb-3(b)(1), unless the authorization is terminated or revoked.  Performed at [facility], [address] [place], [place] [address]       Radiology Studies: No results found.  Scheduled Meds: . sodium chloride   Intravenous Once  . allopurinol  100 mg Oral Daily  . amiodarone  200 mg Oral Daily  . ascorbic acid  250 mg Oral Daily  . Chlorhexidine Gluconate Cloth  6 each Topical Daily  . DULoxetine  30 mg Oral QHS  . [START ON 08/06/2020] pantoprazole  40 mg Intravenous Q12H  . sucralfate  1 g Oral BID  . vitamin B-12  500 mcg Oral Daily   Continuous Infusions: . pantoprozole (PROTONIX) infusion 8 mg/hr (08/05/20 0605)     LOS: 3 days   Time spent: 27 minutes   Darliss Cheney, MD [facility]  08/05/2020, 11:43 AM   To contact the attending provider between 7A-7P or the covering provider during after hours 7P-7A, please log into the web site [email or web address].

## 2020-08-05 NOTE — Progress Notes (Addendum)
Carl [name] , MD [address], [place], [place], [place], [address] [address], [place], [place], [place], [address] Phone: [phone]  Fax: [phone]   Carl [name] is being followed for melena   Subjective: When I went into the room he was having a bowel movement which was black in color.  Denies any pain or discomfort.  Denies any hematemesis.  Spoke with nurse who states that he has been having 1 black bowel movement per day.   Objective: Vital signs in last 24 hours: Vitals:   08/04/20 2000 08/04/20 2300 08/05/20 0500 08/05/20 0800  BP: 94/64 100/69 95/84 92/78   Pulse: 74 74 80 71  Resp: 18 20 18 18   Temp: 97.8 F (36.6 C)  98 F (36.7 C)   TempSrc: Axillary  Oral   SpO2: 99% 95% 100% 95%  Weight:   67.3 kg   Height:       Weight change: -1.724 kg  Intake/Output Summary (Last 24 hours) at 08/05/2020 1032 Last data filed at 08/05/2020 0158 Gross per 24 hour  Intake 1038.77 ml  Output 1150 ml  Net -111.23 ml     Exam:  Abdomen: soft, nontender, normal bowel sounds   Lab Results: @LABTEST2 @ Micro Results: Recent Results (from the past 240 hour(s))  Resp Panel by RT-PCR (Flu A&B, Covid) Nasopharyngeal Swab     Status: None   Collection Time: 08/02/20 10:54 PM   Specimen: Nasopharyngeal Swab; Nasopharyngeal(NP) swabs in vial transport medium  Result Value Ref Range Status   SARS Coronavirus 2 by RT PCR NEGATIVE NEGATIVE Final    Comment: (NOTE) SARS-CoV-2 target nucleic acids are NOT DETECTED.  The SARS-CoV-2 RNA is generally detectable in upper respiratory specimens during the acute phase of infection. The lowest concentration of SARS-CoV-2 viral copies this assay can detect is 138 copies/mL. A negative result does not preclude SARS-Cov-2 infection and should not be used as the sole basis for treatment or other patient management decisions. A negative result may occur with  improper specimen collection/handling, submission of specimen  other than nasopharyngeal swab, presence of viral mutation(s) within the areas targeted by this assay, and inadequate number of viral copies(<138 copies/mL). A negative result must be combined with clinical observations, patient history, and epidemiological information. The expected result is Negative.  Fact Sheet for Patients:  EntrepreneurPulse.com.au  Fact Sheet for Healthcare Providers:  IncredibleEmployment.be  This test is no t yet approved or cleared by the [place] FDA and  has been authorized for detection and/or diagnosis of SARS-CoV-2 by FDA under an Emergency Use Authorization (EUA). This EUA will remain  in effect (meaning this test can be used) for the duration of the COVID-19 declaration under Section 564(b)(1) of the Act, 21 U.S.C.section 360bbb-3(b)(1), unless the authorization is terminated  or revoked sooner.       Influenza A by PCR NEGATIVE NEGATIVE Final   Influenza B by PCR NEGATIVE NEGATIVE Final    Comment: (NOTE) The Xpert Xpress SARS-CoV-2/FLU/RSV plus assay is intended as an aid in the diagnosis of influenza from Nasopharyngeal swab specimens and should not be used as a sole basis for treatment. Nasal washings and aspirates are unacceptable for Xpert Xpress SARS-CoV-2/FLU/RSV testing.  Fact Sheet for Patients: EntrepreneurPulse.com.au  Fact Sheet for Healthcare Providers: IncredibleEmployment.be  This test is not yet approved or cleared by the [place] FDA and has been authorized for detection and/or diagnosis of SARS-CoV-2 by FDA under an Emergency Use Authorization (EUA). This EUA will remain  in effect (meaning this test can be used) for the duration of the COVID-19 declaration under Section 564(b)(1) of the Act, 21 U.S.C. section 360bbb-3(b)(1), unless the authorization is terminated or revoked.  Performed at [facility], [address],  [place], [place] [address]    Studies/Results: No results found. Medications: I have reviewed the patient's current medications. Scheduled Meds: . sodium chloride   Intravenous Once  . allopurinol  100 mg Oral Daily  . amiodarone  200 mg Oral Daily  . ascorbic acid  250 mg Oral Daily  . Chlorhexidine Gluconate Cloth  6 each Topical Daily  . DULoxetine  30 mg Oral QHS  . [START ON 08/06/2020] pantoprazole  40 mg Intravenous Q12H  . sucralfate  1 g Oral BID  . vitamin B-12  500 mcg Oral Daily   Continuous Infusions: . pantoprozole (PROTONIX) infusion 8 mg/hr (08/05/20 0605)   PRN Meds:.acetaminophen   Assessment: Principal Problem:   Acute on chronic blood loss anemia Active Problems:   GERD (gastroesophageal reflux disease)   Hypokalemia   Chronic systolic CHF (congestive heart failure) (HCC)   Cirrhosis (HCC)   Acute kidney injury superimposed on CKD ([place])   Leukocytosis  Carl [name] 69 y.o. 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  Prior evaluation by Dr. [name] has had bidirectional endoscopy the past as well as capsule study.  I reviewed his capsule study of the small bowel which was performed yesterday and it shows bright red blood in the stomach and proximal small bowel.  It is fresh.  Distal part of the small bowel are not visualized adequately due to black liquid/ blood which has been bleeding higher up.  Plan 1.  Push enteroscopy tomorrow 2.  Following push enteroscopy if continues to have melena will need repeat capsule study to evaluate rest of the small bowel. 3.  Consider checking a iron levels and provide iron infusion if needed 4.  Continue IV PPI.  Monitor CBC and transfuse as needed.  Likely bleeding from AVMs based on his past medical history.   I have discussed  alternative options, risks & benefits,  which include, but are not limited to, bleeding, infection, perforation,respiratory complication & drug reaction.  The patient agrees with this plan & written consent will be obtained.      LOS: 3 days   Carl Bellows, MD 08/05/2020, 10:32 AM

## 2020-08-05 NOTE — Evaluation (Signed)
Occupational Therapy Evaluation Patient Details Name: Carl [name] MRN: [id number] DOB: April 03, 1951 Today's Date: 08/05/2020    History of Present Illness Carl [name] is a 69 y.o. male with medical history significant for chronic systolic heart failure with most recent echocardiogram showing LVEF 20% status post AICD placement, paroxysmal atrial fibrillation chronically anticoagulated on Eliquis, stage IV chronic kidney disease with baseline creatinine 2.2-2.7, chronic blood loss anemia with baseline hemoglobin of 7-9, cirrhosis, who is admitted to [facility] on 08/02/2020 with acute on chronic blood loss anemia after presenting from home to [facility] Emergency Department for evaluation of ground-level mechanical fall. Pt with x2 PRBC transfusions since admission for low Hgb. Imaging negative for acute changes.   Clinical Impression   Ms Holzhauer was seen for OT evaluation this date. Prior to hospital admission, pt was MOD I for mobility/dressing, requiring assist for IADLs and supervision for bathing. Pt lives alone c family available PRN. Pt presents to acute OT demonstrating impaired ADL performance and functional mobility 2/2 poor safety awareness and functional strength/balance/endurance deficits. Pt currently requires MOD A for bed mobility - assist for trunk exiting bed and BLE return to bed, endorses back pain (not quantified). MIN A for LB access seated EOB. SBA for seated grooming tasks. Pt would benefit from skilled OT to address noted impairments and functional limitations (see below for any additional details) in order to maximize safety and independence while minimizing falls risk and caregiver burden. Upon hospital discharge, recommend STR to maximize pt safety and return to PLOF.     Follow Up Recommendations  SNF    Equipment Recommendations  Other (comment) (TBD)    Recommendations for Other Services       Precautions / Restrictions Precautions Precautions:  Fall Precaution Comments: DNR Restrictions Weight Bearing Restrictions: No      Mobility Bed Mobility Overal bed mobility: Needs Assistance Bed Mobility: Supine to Sit;Sit to Supine     Supine to sit: Mod assist;HOB elevated Sit to supine: Mod assist   General bed mobility comments: assist for trunk exiting bed and BLE return to bed - endorses back pain (not quantified)    Transfers Overall transfer level: Needs assistance   Transfers: Sit to/from Stand Sit to Stand: Min guard         General transfer comment: Pt deferred stating back pain and fatigue    Balance Overall balance assessment: Needs assistance Sitting-balance support: Feet supported;Bilateral upper extremity supported Sitting balance-Leahy Scale: Fair Sitting balance - Comments: Fair balance, intermittently decreasing to poor due to posterior lean, Postural control: Posterior lean Standing balance support: Bilateral upper extremity supported;During functional activity Standing balance-Leahy Scale: Fair Standing balance comment: Fair standing balance with BUE support in RW. Intermittent posterior lean with poor safety awareness, RW negotiation.                           ADL either performed or assessed with clinical judgement   ADL Overall ADL's : Needs assistance/impaired                                       General ADL Comments: MIN A for LB access seated EOB. SBA for seated grooming tasks                  Pertinent Vitals/Pain Pain Assessment: Faces Faces Pain Scale: Hurts even more  Pain Location: back Pain Descriptors / Indicators: Aching;Discomfort;Dull Pain Intervention(s): Limited activity within patient's tolerance;Repositioned     Hand Dominance Left   Extremity/Trunk Assessment Upper Extremity Assessment Upper Extremity Assessment: Generalized weakness   Lower Extremity Assessment Lower Extremity Assessment: Generalized weakness   Cervical / Trunk  Assessment Cervical / Trunk Assessment: Kyphotic   Communication Communication Communication: HOH   Cognition Arousal/Alertness: Awake/alert Behavior During Therapy: WFL for tasks assessed/performed Overall Cognitive Status: Within Functional Limits for tasks assessed                                 General Comments: no dentures and garbled speech - primarily nods/shakes head   General Comments  increased bruising noted along L lumbar spine/sacrum    Exercises Exercises: Other exercises Other Exercises Other Exercises: Pt educated re: OT role, DME recs, d/c recs, falls prevention Other Exercises: LBD, sup<>sit, sitting balance/toelrance   Shoulder Instructions      Home Living Family/patient expects to be discharged to:: Private residence Living Arrangements: Alone Available Help at Discharge: Family;Available PRN/intermittently Type of Home: House Home Access: Stairs to enter [employer] of Steps: 3 Entrance Stairs-Rails: Can reach both Home Layout: One level     Bathroom Shower/Tub: Occupational psychologist: Standard Bathroom Accessibility: Yes   Home Equipment: Cane - quad;Walker - standard;Grab bars - tub/shower;Shower seat          Prior Functioning/Environment Level of Independence: Needs assistance  Gait / Transfers Assistance Needed: Mod I for ambulation with RW; intermittent assistance from brother/sister for community ambulation and stair navigation for safety. ADL's / Homemaking Assistance Needed: Per brother, pt mod I with dressing, but requires assistance/supevision for bathing/toileting for safety. Family assists with meal prep and medication management.            OT Problem List: Decreased strength;Decreased range of motion;Decreased activity tolerance;Impaired balance (sitting and/or standing);Decreased safety awareness      OT Treatment/Interventions: Self-care/ADL training;Therapeutic exercise;Energy  conservation;DME and/or AE instruction;Therapeutic activities;Patient/family education;Balance training    OT Goals(Current goals can be found in the care plan section) Acute Rehab OT Goals Patient Stated Goal: to get better OT Goal Formulation: With patient/family Time For Goal Achievement: 08/19/20 Potential to Achieve Goals: Good ADL Goals Pt Will Perform Grooming: with modified independence;sitting Pt Will Perform Lower Body Dressing: sitting/lateral leans;with min assist (c LRAD PRN) Pt Will Transfer to Toilet: with min assist;ambulating;bedside commode (c LRAD PRN)  OT Frequency: Min 1X/week   Barriers to D/C: Decreased caregiver support             AM-PAC OT "6 Clicks" Daily Activity     Outcome Measure Help from another person eating meals?: None Help from another person taking care of personal grooming?: A Little Help from another person toileting, which includes using toliet, bedpan, or urinal?: A Lot Help from another person bathing (including washing, rinsing, drying)?: A Lot Help from another person to put on and taking off regular upper body clothing?: A Little Help from another person to put on and taking off regular lower body clothing?: A Lot 6 Click Score: 16   End of Session    Activity Tolerance: Patient tolerated treatment well Patient left: in bed;with call bell/phone within reach;with bed alarm set;with family/visitor present  OT Visit Diagnosis: Other abnormalities of gait and mobility (R26.89)  Time: 8168-3870 OT Time Calculation (min): 11 min Charges:  OT General Charges $OT Visit: 1 Visit OT Evaluation $OT Eval Low Complexity: 1 Low OT Treatments $Self Care/Home Management : 8-22 mins  Dessie Coma, M.S. OTR/L  08/05/20, 3:41 PM  ascom [phone]

## 2020-08-05 NOTE — Plan of Care (Signed)

## 2020-08-05 NOTE — Evaluation (Signed)
Physical Therapy Evaluation Patient Details Name: MERVYN PFLAUM MRN: [id number] DOB: 1950/10/09 Today's Date: 08/05/2020   History of Present Illness  SAHEED CARRINGTON is a 69 y.o. male with medical history significant for chronic systolic heart failure with most recent echocardiogram showing LVEF 20% status post AICD placement, paroxysmal atrial fibrillation chronically anticoagulated on Eliquis, stage IV chronic kidney disease with baseline creatinine 2.2-2.7, chronic blood loss anemia with baseline hemoglobin of 7-9, cirrhosis, who is admitted to [facility] on 08/02/2020 with acute on chronic blood loss anemia after presenting from home to [facility] Emergency Department for evaluation of ground-level mechanical fall. Pt with x2 PRBC transfusions since admission for low Hgb. Imaging negative for acute changes.    Clinical Impression  Pt is a 69 year old M admitted to hospital on 08/02/20 for mechanical fall due to acute/chronic blood loss anemia. At baseline, pt was mod I for household ambulation with RW and dressing, but required increased assistance from family members for bathing, toileting, meal prep, medication management, and community navigation. Pt presents with slurred speech, functional weakness, decreased balance, decreased activity tolerance, increased pain, and poor safety awareness resulting in impaired functional mobility from baseline. Pt with questionable cognitive deficits; brother denies cognitive deficit, but reports pt has increased difficulty fully answering questions which is his baseline. Due to deficits, pt required mod assist for bed mobility, min guard for transfers, and min guard for short distance ambulation with RW. Pt required increased cueing for safety with transfers and ambulation, including hand placement, corrective weight shift with posterior lean, RW proximity, and RW negotiation. Deficits limit the pt's ability to safely and independently perform ADL's,  transfer, and ambulate, and increase his risk of falls. Pt will benefit from acute skilled PT services to address deficits for return to baseline function. At this time, PT recommends SNF at DC to improve overall safety with functional mobility; pt and brother agreeable. Pt may benefit from 3in1 with return home for energy conservation and to improve safety with functional mobility.     Follow Up Recommendations SNF;Supervision for mobility/OOB    Equipment Recommendations  3in1 (PT)    Recommendations for Other Services OT consult     Precautions / Restrictions Precautions Precautions: Fall Precaution Comments: DNR      Mobility  Bed Mobility Overal bed mobility: Needs Assistance Bed Mobility: Supine to Sit;Sit to Supine     Supine to sit: Mod assist;HOB elevated Sit to supine: Supervision   General bed mobility comments: Mod assist for trunk facilitation to sit EOB with HOB elevated and use of BUE for support on handrail. Supervision for safety to lie supine with HOB flat and use of BUE.    Transfers Overall transfer level: Needs assistance   Transfers: Sit to/from Stand Sit to Stand: Min guard         General transfer comment: Min guard for STS transfers at EOB with RW. Verbal cues for hand placement to ensure safety with mobility. Initial posterior lean in standing, requiring cues for correction.  Ambulation/Gait Ambulation/Gait assistance: Min guard Gait Distance (Feet): 6 Feet Assistive device: None       General Gait Details: Min guard for safety for short distance ambulation with RW. Pt demonstrates shuffled gait pattern with decreased foot clearance/step length bil, forward flexed posture, slowed cadence, and downward gaze. Poor safety awareness and decreased RW negotiation.        Balance Overall balance assessment: Needs assistance Sitting-balance support: Feet supported;Bilateral upper extremity supported Sitting  balance-Leahy Scale: Fair Sitting  balance - Comments: Fair balance, intermittently decreasing to poor due to posterior lean, Postural control: Posterior lean Standing balance support: Bilateral upper extremity supported;During functional activity Standing balance-Leahy Scale: Fair Standing balance comment: Fair standing balance with BUE support in RW. Intermittent posterior lean with poor safety awareness, RW negotiation.                             Pertinent Vitals/Pain Pain Assessment: Faces Faces Pain Scale: Hurts even more Pain Location: back Pain Intervention(s): Limited activity within patient's tolerance;Monitored during session;Repositioned    Home Living Family/patient expects to be discharged to:: Private residence Living Arrangements: Alone Available Help at Discharge: Family;Available PRN/intermittently Type of Home: House Home Access: Stairs to enter Entrance Stairs-Rails: Can reach both Entrance Stairs-Number of Steps: 3 Home Layout: One level Home Equipment: Cane - quad;Walker - standard;Grab bars - tub/shower;Shower seat      Prior Function Level of Independence: Needs assistance   Gait / Transfers Assistance Needed: Mod I for ambulation with RW; intermittent assistance from brother/sister for community ambulation and stair navigation for safety.  ADL's / Homemaking Assistance Needed: Per brother, pt mod I with dressing, but requires assistance/supevision for bathing/toileting for safety. Family assists with meal prep and medication management.        Hand Dominance   Dominant Hand: Left    Extremity/Trunk Assessment   Upper Extremity Assessment Upper Extremity Assessment: Overall WFL for tasks assessed (functional weakness)    Lower Extremity Assessment Lower Extremity Assessment: Overall WFL for tasks assessed (functional weakness)    Cervical / Trunk Assessment Cervical / Trunk Assessment: Kyphotic  Communication   Communication: HOH  Cognition Arousal/Alertness:  Awake/alert Behavior During Therapy: WFL for tasks assessed/performed Overall Cognitive Status: Within Functional Limits for tasks assessed                                 General Comments: Pt A&O x4, but required increased time/effort to answer questions. Pt with slurred garbled speech, without dentures.      General Comments General comments (skin integrity, edema, etc.): increased bruising noted along L lumbar spine/sacrum    Exercises Other Exercises Other Exercises: Pt able to participate in bed mobility, transfers, and short distance ambulation with RW; further mobility limited secondary to IV lines/leads. Pt required increased assistance for bed mobility due to c/o LBP; unable to rate or provide descriptive factors. Min guard for transfers and ambulation, with intermittent posterior lean seated EOB and in standing. Poor safety awareness/RW negotiation in RW. Other Exercises: Pt and brother educated regarding POC while hospitalized, PT DC recommendations, and pt deficits. Pt and brother agreeable to PT recommendation.   Assessment/Plan    PT Assessment Patient needs continued PT services  PT Problem List Decreased strength;Decreased mobility;Decreased safety awareness;Decreased activity tolerance;Decreased balance       PT Treatment Interventions Therapeutic exercise;Gait training;Balance training;Stair training;Neuromuscular re-education;Functional mobility training;Therapeutic activities    PT Goals (Current goals can be found in the Care Plan section)  Acute Rehab PT Goals Patient Stated Goal: to get better PT Goal Formulation: With patient/family Time For Goal Achievement: 08/19/20 Potential to Achieve Goals: Good    Frequency Min 2X/week   Barriers to discharge Decreased caregiver support      Co-evaluation               AM-PAC PT "6 Clicks"  Mobility  Outcome Measure Help needed turning from your back to your side while in a flat bed without  using bedrails?: A Little Help needed moving from lying on your back to sitting on the side of a flat bed without using bedrails?: A Lot Help needed moving to and from a bed to a chair (including a wheelchair)?: A Little Help needed standing up from a chair using your arms (e.g., wheelchair or bedside chair)?: A Little Help needed to walk in hospital room?: A Little Help needed climbing 3-5 steps with a railing? : A Lot 6 Click Score: 16    End of Session Equipment Utilized During Treatment: Gait belt Activity Tolerance: Patient tolerated treatment well Patient left: in bed;with call bell/phone within reach;with bed alarm set;with family/visitor present Nurse Communication: Mobility status PT Visit Diagnosis: Unsteadiness on feet (R26.81);History of falling (Z91.81);Pain Pain - Right/Left:  (back)    Time: 1400-1434 PT Time Calculation (min) (ACUTE ONLY): 34 min   Charges:   PT Evaluation $PT Eval Moderate Complexity: 1 Mod PT Treatments $Therapeutic Activity: 8-22 mins      Herminio Commons, PT, DPT 3:04 PM,08/05/20

## 2020-08-06 ENCOUNTER — Inpatient Hospital Stay: Payer: Medicare Other | Admitting: Anesthesiology

## 2020-08-06 ENCOUNTER — Other Ambulatory Visit: Payer: Self-pay | Admitting: Cardiology

## 2020-08-06 ENCOUNTER — Encounter: Payer: Self-pay | Admitting: Gastroenterology

## 2020-08-06 ENCOUNTER — Encounter
Admission: EM | Disposition: A | Discharge status: Skilled Nursing Facility | Payer: Self-pay | Source: Home / Self Care | Attending: Family Medicine

## 2020-08-06 HISTORY — PX: ENTEROSCOPY: SHX5533

## 2020-08-06 LAB — COMPREHENSIVE METABOLIC PANEL
ALT: 31 U/L (ref 0–44)
AST: 49 U/L — ABNORMAL HIGH (ref 15–41)
Albumin: 3 g/dL — ABNORMAL LOW (ref 3.5–5.0)
Alkaline Phosphatase: 108 U/L (ref 38–126)
Anion gap: 11 (ref 5–15)
BUN: 62 mg/dL — ABNORMAL HIGH (ref 8–23)
CO2: 25 mmol/L (ref 22–32)
Calcium: 9.2 mg/dL (ref 8.9–10.3)
Chloride: 97 mmol/L — ABNORMAL LOW (ref 98–111)
Creatinine, Ser: 2.03 mg/dL — ABNORMAL HIGH (ref 0.61–1.24)
GFR, Estimated: 35 mL/min — ABNORMAL LOW (ref >60)
Glucose, Bld: 112 mg/dL — ABNORMAL HIGH (ref 70–99)
Potassium: 3.8 mmol/L (ref 3.5–5.1)
Sodium: 133 mmol/L — ABNORMAL LOW (ref 135–145)
Total Bilirubin: 2.6 mg/dL — ABNORMAL HIGH (ref 0.3–1.2)
Total Protein: 6 g/dL — ABNORMAL LOW (ref 6.5–8.1)

## 2020-08-06 LAB — CBC WITH DIFFERENTIAL/PLATELET
Abs Immature Granulocytes: 0.08 10*3/uL — ABNORMAL HIGH (ref 0.00–0.07)
Basophils Absolute: 0 10*3/uL (ref 0.0–0.1)
Basophils Relative: 0 %
Eosinophils Absolute: 0.1 10*3/uL (ref 0.0–0.5)
Eosinophils Relative: 2 %
HCT: 26.8 % — ABNORMAL LOW (ref 39.0–52.0)
Hemoglobin: 8.6 g/dL — ABNORMAL LOW (ref 13.0–17.0)
Immature Granulocytes: 1 %
Lymphocytes Relative: 7 %
Lymphs Abs: 0.6 10*3/uL — ABNORMAL LOW (ref 0.7–4.0)
MCH: 31.2 pg (ref 26.0–34.0)
MCHC: 32.1 g/dL (ref 30.0–36.0)
MCV: 97.1 fL (ref 80.0–100.0)
Monocytes Absolute: 0.9 10*3/uL (ref 0.1–1.0)
Monocytes Relative: 10 %
Neutro Abs: 7.1 10*3/uL (ref 1.7–7.7)
Neutrophils Relative %: 80 %
Platelets: 242 10*3/uL (ref 150–400)
RBC: 2.76 MIL/uL — ABNORMAL LOW (ref 4.22–5.81)
RDW: 24.6 % — ABNORMAL HIGH (ref 11.5–15.5)
Smear Review: NORMAL
WBC: 8.8 10*3/uL (ref 4.0–10.5)
nRBC: 0.6 % — ABNORMAL HIGH (ref 0.0–0.2)

## 2020-08-06 LAB — PROTIME-INR
INR: 1.5 — ABNORMAL HIGH (ref 0.8–1.2)
Prothrombin Time: 17.1 seconds — ABNORMAL HIGH (ref 11.4–15.2)

## 2020-08-06 LAB — MAGNESIUM: Magnesium: 2.2 mg/dL (ref 1.7–2.4)

## 2020-08-06 LAB — PREPARE RBC (CROSSMATCH)

## 2020-08-06 SURGERY — ENTEROSCOPY
Anesthesia: General

## 2020-08-06 MED ORDER — LIDOCAINE HCL (PF) 4 % IJ SOLN
INTRAMUSCULAR | Status: DC | PRN
  Administered 2020-08-06: 4 mL

## 2020-08-06 MED ORDER — PROPOFOL 10 MG/ML IV BOLUS
INTRAVENOUS | Status: DC | PRN
  Administered 2020-08-06 (×2): 20 mg via INTRAVENOUS
  Administered 2020-08-06: 40 mg via INTRAVENOUS
  Administered 2020-08-06 (×4): 20 mg via INTRAVENOUS

## 2020-08-06 MED ORDER — PHENYLEPHRINE HCL (PRESSORS) 10 MG/ML IV SOLN
INTRAVENOUS | Status: AC
  Filled 2020-08-06: qty 1

## 2020-08-06 MED ORDER — ATORVASTATIN CALCIUM 20 MG PO TABS
40.0000 mg | ORAL_TABLET | Freq: Every evening | ORAL | Status: DC
  Administered 2020-08-06 – 2020-08-09 (×4): 40 mg via ORAL
  Filled 2020-08-06 (×4): qty 2

## 2020-08-06 MED ORDER — PHENYLEPHRINE HCL (PRESSORS) 10 MG/ML IV SOLN
INTRAVENOUS | Status: DC | PRN
  Administered 2020-08-06 (×4): 100 ug via INTRAVENOUS

## 2020-08-06 MED ORDER — SODIUM CHLORIDE 0.9 % IV SOLN: INTRAVENOUS | Status: DC

## 2020-08-06 MED ORDER — PROPOFOL 500 MG/50ML IV EMUL
INTRAVENOUS | Status: AC
  Filled 2020-08-06: qty 50

## 2020-08-06 NOTE — Progress Notes (Signed)
PROGRESS NOTE    Carl [name]  EZM:[id number] DOB: 24-Mar-1951 DOA: 08/02/2020 PCP: [name], MD   Brief Narrative:  HPI: Carl [name] is a 69 y.o. male with medical history significant for chronic systolic heart failure with most recent echocardiogram showing LVEF 20% status post AICD placement, paroxysmal atrial fibrillation chronically anticoagulated on Eliquis, stage IV chronic kidney disease with baseline creatinine 2.2-2.7, chronic blood loss anemia with baseline hemoglobin of 7-9, cirrhosis, who is admitted to [facility] on 08/02/2020 with acute on chronic blood loss anemia after presenting from home to [facility] Emergency Department for evaluation of ground-level mechanical fall.   Earlier today, ambulate to the car following dinner, the patient reports that he tripped and fell outside.  He did not believe that he hit his head as a component a loss of consciousness.  He reports that this was purely mechanical fall, noting that he tripped in the parking lot.  No preceding or subsequent chest pain.  He was encouraged by the party that he was approached the renal mechanical fall with and therefore presented to the emergency department for further evaluation.  Denies any headache, neck stiffness, vision, dizziness, or vertigo.  The patient reports that he is at his baseline is without complaint at this time.  He denies any shortness of breath, orthopnea, and worsening of his bilateral peripheral edema.  Denies any recent chest pain, palpitations, diaphoresis.  Denies any recent nausea or vomiting.  He acknowledges a history of chronic blood loss anemia in the setting of recurrent gastrointestinal bleeds, but has not recently noticed any melena or hematochezia.  He is chronically anticoagulated on Eliquis in the setting of paroxysmal atrial fibrillation.  Denies any use of NSAIDs.   He denies pain, diarrhea, or rash.  Dysuria, gross hematuria, or change in urinary  urgency/frequency.  In the setting of recurrent gastrointestinal bleeds, it appears that the patient underwent colonoscopy via Dr. [name] on 07/23/2020, as well as preceding EGD on 06/28/2020 via Dr. Watt Climes.  In the setting, the patient reports that his baseline systolic blood pressures about 90 mmHg.   ED Course:  Vital signs in the ED were notable for the following: Temperature max 97.9; heart rate 62-67; initial blood pressure 76/40, which increased to baseline value of 90/53 following a small interval normal saline bolus, as further modified below; respiratory rate 18-20, and oxygen saturation 96 to 99% on room air.  Labs were notable for the following: CMP was notable for the following: Sodium 131, potassium 3.0, creatinine 2.91 relative to baseline range of 2.2-2.7, with most recent prior creatinine noted to be 2.26 when checked on 07/12/2020 CBC was notable for the following White blood cell count of 11,000 relative to most recent prior white blood cell count of 4400 on 07/02/2020.  Hemoglobin 6.8, relative to his baseline range of 7-9, with most recent prior hemoglobin noted to be 8.3.  Today's hemoglobin value associated with elevated RDW, and platelets were noted to be 263.  Serum magnesium level 2.3.  Screening nasopharyngeal COVID-19/influenza PCR checked in the ED this evening found to be negative.  Noncontrast CT of the head showed no evidence of acute intracranial process, including no evidence of intracranial hemorrhage.  CT of the cervical spine showed no evidence of acute fracture or malalignment of the cervical spine.  While in the ED, the following were administered: Lactated Ringer bolus x200 cc.  Protonix drip initiated, and transfusion of 1 unit PRBC very slowly was ordered by the  emergency department physician.  Assessment & Plan:   Principal Problem:   Acute on chronic blood loss anemia Active Problems:   GERD (gastroesophageal reflux disease)   Hypokalemia   Chronic  systolic CHF (congestive heart failure) (HCC)   Cirrhosis (HCC)   Acute kidney injury superimposed on CKD (HCC)   Leukocytosis   Acute on chronic blood loss anemia due to possible upper GI bleed: Reportedly, patient had EGD as well as colonoscopy recently and both of them were unremarkable.  He also saw hematology oncology due to persistent anemia and was recommended weekly IV iron.  Patient yet once again presented with acute on chronic anemia with hemoglobin of 6.8.  He received 1 unit of PRBC transfusion on the night of 08/02/2020.  Hemoglobin improved to 7.9 and dropped again to 6.8.  On the morning of 08/03/2020 and he received another unit of transfusion.  Hemoglobin has remained stable since then.  Continue on Protonix drip.  GI on board.  Per GI note, capsule endoscopy indicates that he is likely bleeding from AVMs in the stomach and proximal part of the small intestine.  Distal part of the small intestine was not evaluated.  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  Persistent hypotension: Patient's blood pressure has improved now.  Acute kidney injury on chronic kidney disease stage IIIb: Could very well be due to upper GI bleed and dehydration is well as.only slight improvement in creatinine.  Renal function back to baseline.  Ground-level mechanical fall: Likely due to weakness secondary to anemia.  PT OT consulted.  Hypokalemia: Resolved.  Leukocytosis: Nonspecific and unremarkable which resolved.  No signs of infection.   Chronic systolic heart failure: with most recent echocardiogram showing evidence of left ventricular ejection fraction of 20%, status post AICD.  He is on torsemide 100 mg p.o. daily.  In addition to Coreg.  He appears euvolemic so we will hold torsemide in the setting of low blood pressure and elevated creatinine  Paroxysmal atrial fibrillation: Chronically anticoagulated on Eliquis which is  on hold due to GI bleed as above.  Continue amiodarone and lower dose of carvedilol.  Liver cirrhosis: Compensated.  Generalized weakness/deconditioning/multiple medical problems: Seen by palliative care.  Not interested in hospice.  He sees outpatient palliative care.  Remains DNR.  Seen by PT OT and they recommend SNF.  TOC consulted.  DVT prophylaxis: SCDs Start: 08/03/20 0049   Code Status: DNR  Family Communication: None present at bedside today.  Status is: Inpatient  Remains inpatient appropriate because:Inpatient level of care appropriate due to severity of illness   Dispo: The patient is from: Home              Anticipated d/c is to: SNF              Anticipated d/c date is: 1 to 2 days              Patient currently is medically stable to d/c.    Estimated body mass index is 19.45 kg/m as calculated from the following:   Height as of this encounter: 6\' 1"  (1.854 m).   Weight as of this encounter: 66.9 kg.  Pressure Injury 06/28/20 Hip Left Stage 1 -  Intact skin with non-blanchable redness of a localized area usually over a bony prominence. reddened spot, partially blanchable (Active)  06/28/20 2000  Location: Hip  Location Orientation: Left  Staging: Stage 1 -  Intact skin with non-blanchable redness of a localized area usually over a bony prominence.  Wound Description (Comments): reddened spot, partially blanchable  Present on Admission:      Pressure Injury 06/28/20 Sacrum Mid Stage 1 -  Intact skin with non-blanchable redness of a localized area usually over a bony prominence. circular, quarter sized (Active)  06/28/20 2000  Location: Sacrum  Location Orientation: Mid  Staging: Stage 1 -  Intact skin with non-blanchable redness of a localized area usually over a bony prominence.  Wound Description (Comments): circular, quarter sized  Present on Admission:      Nutritional status:               Consultants:   GI  Procedures:    None  Antimicrobials:  Anti-infectives (From admission, onward)   Start     Dose/Rate Route Frequency Ordered Stop   08/03/20 0100  ciprofloxacin (CIPRO) IVPB 400 mg  Status:  Discontinued        400 mg 200 mL/hr over 60 Minutes Intravenous Every 24 hours 08/03/20 0056 08/03/20 0806         Subjective: Patient seen and examined post procedure.  He had no complaint.  He was slightly sleepy.  Objective: Vitals:   08/06/20 0822 08/06/20 0827 08/06/20 0900 08/06/20 1017  BP: 95/63 95/61 (!) 94/59   Pulse:   76   Resp:   14   Temp:    98 F (36.7 C)  TempSrc:      SpO2: 97%  96%   Weight:      Height:        Intake/Output Summary (Last 24 hours) at 08/06/2020 1330 Last data filed at 08/06/2020 0801 Gross per 24 hour  Intake 447.95 ml  Output 1610 ml  Net -1162.05 ml   Filed Weights   08/04/20 0353 08/05/20 0500 08/06/20 0531  Weight: 69 kg 67.3 kg 66.9 kg    Examination:  General exam: Appears calm and comfortable but slightly sleepy Respiratory system: Clear to auscultation. Respiratory effort normal. Cardiovascular system: S1 & S2 heard, RRR. No JVD, murmurs, rubs, gallops or clicks. No pedal edema. Gastrointestinal system: Abdomen is nondistended, soft and nontender. No organomegaly or masses felt. Normal bowel sounds heard. Central nervous system: Sleepy and partially oriented. No focal neurological deficits. Extremities: Symmetric 5 x 5 power. Skin: No rashes, lesions or ulcers.  Psychiatry: Judgement and insight appear poor.  Data Reviewed: I have personally reviewed following labs and imaging studies  CBC: Recent Labs  Lab 08/02/20 2116 08/03/20 0421 08/03/20 0922 08/03/20 1520 08/03/20 2042 08/04/20 0539 08/05/20 0440 08/06/20 0637  WBC 11.0* 8.1  --   --   --  8.9 11.5* 8.8  NEUTROABS 9.2* 6.6  --   --   --  7.2 9.3* 7.1  HGB 6.8* 6.9*   < > 8.4* 8.4* 8.7* 8.6* 8.6*  HCT 21.2* 21.6*   < > 25.9* 26.2* 26.6* 26.3* 26.8*  MCV 100.0 97.7  --    --   --  94.0 94.9 97.1  PLT 263 235  --   --   --  217 240 242   < > = values in this interval not displayed.   Basic Metabolic Panel: Recent Labs  Lab 08/02/20 2116 08/03/20 0421 08/04/20 0539 08/05/20 0440 08/06/20 0637  NA 131* 130* 133* 130* 133*  K 3.0* 2.9* 2.6* 4.3 3.8  CL 87* 88* 92* 95* 97*  CO2 30 28 28 25 25   GLUCOSE 125*  121* 123* 123* 112*  BUN 82* 89* 88* 78* 62*  CREATININE 2.91* 2.97* 2.89* 2.42* 2.03*  CALCIUM 8.8* 8.6* 8.5* 9.0 9.2  MG 2.3 2.2 2.3  --  2.2  PHOS  --  3.3  --   --   --    GFR: Estimated Creatinine Clearance: 32.5 mL/min (A) (by C-G formula based on SCr of 2.03 mg/dL (H)). Liver Function Tests: Recent Labs  Lab 08/02/20 2116 08/03/20 0421 08/04/20 0539 08/05/20 0440 08/06/20 0637  AST 49* 46* 52* 54* 49*  ALT 32 30 30 33 31  ALKPHOS 114 110 100 112 108  BILITOT 1.8* 1.7* 2.4* 2.4* 2.6*  PROT 6.2* 5.9* 5.9* 6.3* 6.0*  ALBUMIN 3.1* 3.0* 2.9* 3.0* 3.0*   No results for input(s): LIPASE, AMYLASE in the last 168 hours. No results for input(s): AMMONIA in the last 168 hours. Coagulation Profile: Recent Labs  Lab 08/03/20 0421 08/04/20 0539 08/05/20 0440 08/06/20 0637  INR 2.4* 1.9* 1.6* 1.5*   Cardiac Enzymes: No results for input(s): CKTOTAL, CKMB, CKMBINDEX, TROPONINI in the last 168 hours. BNP (last 3 results) No results for input(s): PROBNP in the last 8760 hours. HbA1C: No results for input(s): HGBA1C in the last 72 hours. CBG: No results for input(s): GLUCAP in the last 168 hours. Lipid Profile: No results for input(s): CHOL, HDL, LDLCALC, TRIG, CHOLHDL, LDLDIRECT in the last 72 hours. Thyroid Function Tests: No results for input(s): TSH, T4TOTAL, FREET4, T3FREE, THYROIDAB in the last 72 hours. Anemia Panel: No results for input(s): VITAMINB12, FOLATE, FERRITIN, TIBC, IRON, RETICCTPCT in the last 72 hours. Sepsis Labs: Recent Labs  Lab 08/03/20 1520  LATICACIDVEN 1.4    Recent Results (from the past 240 hour(s))   Resp Panel by RT-PCR (Flu A&B, Covid) Nasopharyngeal Swab     Status: None   Collection Time: 08/02/20 10:54 PM   Specimen: Nasopharyngeal Swab; Nasopharyngeal(NP) swabs in vial transport medium  Result Value Ref Range Status   SARS Coronavirus 2 by RT PCR NEGATIVE NEGATIVE Final    Comment: (NOTE) SARS-CoV-2 target nucleic acids are NOT DETECTED.  The SARS-CoV-2 RNA is generally detectable in upper respiratory specimens during the acute phase of infection. The lowest concentration of SARS-CoV-2 viral copies this assay can detect is 138 copies/mL. A negative result does not preclude SARS-Cov-2 infection and should not be used as the sole basis for treatment or other patient management decisions. A negative result may occur with  improper specimen collection/handling, submission of specimen other than nasopharyngeal swab, presence of viral mutation(s) within the areas targeted by this assay, and inadequate number of viral copies(<138 copies/mL). A negative result must be combined with clinical observations, patient history, and epidemiological information. The expected result is Negative.  Fact Sheet for Patients:  EntrepreneurPulse.com.au  Fact Sheet for Healthcare Providers:  IncredibleEmployment.be  This test is no t yet approved or cleared by the [place] FDA and  has been authorized for detection and/or diagnosis of SARS-CoV-2 by FDA under an Emergency Use Authorization (EUA). This EUA will remain  in effect (meaning this test can be used) for the duration of the COVID-19 declaration under Section 564(b)(1) of the Act, 21 U.S.C.section 360bbb-3(b)(1), unless the authorization is terminated  or revoked sooner.       Influenza A by PCR NEGATIVE NEGATIVE Final   Influenza B by PCR NEGATIVE NEGATIVE Final    Comment: (NOTE) The Xpert Xpress SARS-CoV-2/FLU/RSV plus assay is intended as an aid in the diagnosis of influenza from  Nasopharyngeal swab specimens and should not be used as a sole basis for treatment. Nasal washings and aspirates are unacceptable for Xpert Xpress SARS-CoV-2/FLU/RSV testing.  Fact Sheet for Patients: EntrepreneurPulse.com.au  Fact Sheet for Healthcare Providers: IncredibleEmployment.be  This test is not yet approved or cleared by the [place] FDA and has been authorized for detection and/or diagnosis of SARS-CoV-2 by FDA under an Emergency Use Authorization (EUA). This EUA will remain in effect (meaning this test can be used) for the duration of the COVID-19 declaration under Section 564(b)(1) of the Act, 21 U.S.C. section 360bbb-3(b)(1), unless the authorization is terminated or revoked.  Performed at [facility], [address], [place], [place] [address]       Radiology Studies: No results found.  Scheduled Meds: . sodium chloride   Intravenous Once  . allopurinol  100 mg Oral Daily  . amiodarone  200 mg Oral Daily  . ascorbic acid  250 mg Oral Daily  . Chlorhexidine Gluconate Cloth  6 each Topical Daily  . DULoxetine  30 mg Oral QHS  . pantoprazole  40 mg Intravenous Q12H  . sucralfate  1 g Oral BID  . vitamin B-12  500 mcg Oral Daily   Continuous Infusions:    LOS: 4 days   Time spent: 25 minutes   Darliss Cheney, MD [facility]  08/06/2020, 1:30 PM   To contact the attending provider between 7A-7P or the covering provider during after hours 7P-7A, please log into the web site [email or web address].

## 2020-08-06 NOTE — Anesthesia Preprocedure Evaluation (Addendum)
Anesthesia Evaluation  Patient identified by MRN, date of birth, ID band Patient awake  General Assessment Comment:Patient somewhat lethargic, and extremely difficult to understand; unable to speak anything more understandable than a couple of words  Reviewed: Allergy & Precautions, NPO status , Patient's Chart, lab work & pertinent test results  History of Anesthesia Complications Negative for: history of anesthetic complications  Airway Mallampati: II       Dental  (+) Edentulous Upper, Edentulous Lower   Pulmonary neg pulmonary ROS, former smoker,    Pulmonary exam normal breath sounds clear to auscultation       Cardiovascular hypertension, + CAD, + Past MI, + Peripheral Vascular Disease and +CHF  + dysrhythmias + Cardiac Defibrillator + Valvular Problems/Murmurs MR  Rhythm:Regular Rate:Normal + Diastolic murmurs TEE 01/4331: 1. Left ventricular ejection fraction, by estimation, is 20 to 25%. The  left ventricle has severely decreased function. The left ventricle  demonstrates global hypokinesis. The left ventricular internal cavity size  was moderately dilated.  2. Right ventricular systolic function is moderately reduced. The right  ventricular size is mildly enlarged. There is mildly elevated pulmonary  artery systolic pressure.  3. There was an Amplatzer atrial septal closure device present, no  shunting noted by color doppler. Left atrial size was moderately dilated.  No left atrial/left atrial appendage thrombus was detected.  4. Right atrial size was moderately dilated.  5. Peak RV-RA gradient 24 mmHg. Tricuspid valve regurgitation is moderate  to severe.  6. The aortic valve is tricuspid. Aortic valve regurgitation is trivial.  No aortic stenosis is present.  7. The mitral valve was s/p repair with 2 Mitraclip devices placed in the  A2-P2 area. There was moderate residual MR with PISA ERO 0.25 cm^2. Mean   gradient 3 mmHg across the mitral valve, no significant stenosis. No  pulmonary vein systolic flow reversal  noted.  8. Normal caliber thoracic aorta with mild plaque.    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 No date: CHF (congestive heart failure) (HCC) No date: Coronary artery disease No date: External hemorrhoids 07/21/2018: Fracture of femoral neck, left (HCC) No date: GERD (gastroesophageal reflux disease) No date: Hearing loss No date: HH (hiatus hernia) No date: Internal hemorrhoids No date: Ischemic cardiomyopathy No date: Leg fracture, left No date: Myocardial infarction ([place]) No date: Paronychia No date: Pneumonia No date: Psoriasis No date: PUD (peptic ulcer disease) No date: Schatzki's ring No date: Tubular adenoma of colon No date: Vitiligo  Reproductive/Obstetrics negative OB ROS                            Anesthesia Physical  Anesthesia Plan  ASA: IV  Anesthesia Plan: General   Post-op Pain Management:    Induction: Intravenous  PONV Risk Score and Plan: 2 and Ondansetron, Propofol infusion, TIVA and Treatment may vary due to age or medical condition  Airway Management Planned: Nasal Cannula and Natural Airway  Additional Equipment: None  Intra-op Plan:  Post-operative Plan:   Informed Consent: I have reviewed the patients History and Physical, chart, labs and discussed the procedure  including the risks, benefits and alternatives for the proposed anesthesia with the patient or authorized representative who has indicated his/her understanding and acceptance.   Patient has DNR.  Discussed DNR with power of attorney and Continue DNR.   Dental advisory given  Plan Discussed with: CRNA and Surgeon  Anesthesia Plan Comments: (Discussed risks of anesthesia with BROTHER/POA via phone consent, including possibility of difficulty with spontaneous ventilation under anesthesia necessitating airway intervention, PONV, and rare risks such as cardiac or respiratory or neurological events. He understands. counseled on being higher risk for anesthesia due to comorbidities: CHF. was told about increased risk of cardiac and respiratory events, including death.   Discussed DNR and the inherent contradiction with having an ICD in place and being DNR. Brother said that the patient does not want any chest compressions performed; other interventions OK. )        Anesthesia Quick Evaluation

## 2020-08-06 NOTE — H&P (Signed)
Carl Bellows, MD [address] [place], [place], [place], [address] [address], [place], [place], [place], [address] Phone: [phone]  Fax: [phone]  Primary Care Physician:  [name], MD   Pre-Procedure History & Physical: HPI:  Carl [name] is a 69 y.o. male is here for an endoscopy    Past Medical History:  Diagnosis Date  . AICD (automatic cardioverter/defibrillator) present   . Anemia   . Anxiety   . Arthritis   . Brunner's gland hyperplasia of duodenum   . CHF (congestive heart failure) ([place])   . Chronic kidney disease   . Cirrhosis of liver ([place])   . Coronary artery disease   . Depression   . Dysrhythmia    atrial fib  . External hemorrhoids   . Fracture of femoral neck, left ([place]) 07/21/2018  . GERD (gastroesophageal reflux disease)   . Gout   . Hearing loss   . Heart murmur    mirtal valve insufficiency  . Hepatic failure ([place])   . HH (hiatus hernia)   . Hyperlipidemia   . Hypokalemia   . Hyposmolality   . Intellectual disability    mild  . Internal hemorrhoids   . Ischemic cardiomyopathy   . Leg fracture, left   . Myocardial infarction ([place])   . Osteoporosis   . Paronychia   . Peripheral vascular disease ([place])   . Pneumonia   . Psoriasis   . Psoriasis   . PUD (peptic ulcer disease)   . Schatzki's ring   . Tubular adenoma of colon   . Vitiligo     Past Surgical History:  Procedure Laterality Date  . ATRIAL SEPTAL DEFECT(ASD) CLOSURE N/A 12/30/2018   Procedure: ATRIAL SEPTAL DEFECT(ASD) CLOSURE;  Surgeon: [name], MD;  Location: [facility];  Service: Cardiovascular;  Laterality: N/A;  . CARDIAC SURGERY     ICD  . CARDIOVERSION N/A 11/09/2019   Procedure: CARDIOVERSION;  Surgeon: [name], MD;  Location: [facility] ENDOSCOPY;  Service: Cardiovascular;  Laterality: N/A;  . CARDIOVERSION N/A 12/21/2019   Procedure: CARDIOVERSION;  Surgeon: [name], MD;  Location: [facility];  Service:  Cardiovascular;  Laterality: N/A;  . CATARACT EXTRACTION W/PHACO Right 07/26/2020   Procedure: CATARACT EXTRACTION PHACO AND INTRAOCULAR LENS PLACEMENT ([place]) RIGHT VISION BLUE;  Surgeon: Birder Robson, MD;  Location: [facility] ORS;  Service: Ophthalmology;  Laterality: Right;  [place] 01:14.1 CDE 13.65 Fluid Pack Lot # [id number]  . COLONOSCOPY WITH ESOPHAGOGASTRODUODENOSCOPY (EGD)    . COLONOSCOPY WITH PROPOFOL N/A 08/24/2017   Procedure: COLONOSCOPY WITH PROPOFOL;  Surgeon: [name], MD;  Location: [facility] ENDOSCOPY;  Service: Endoscopy;  Laterality: N/A;  . COLONOSCOPY WITH PROPOFOL N/A 07/23/2020   Procedure: COLONOSCOPY WITH PROPOFOL;  Surgeon: Toledo, [name], MD;  Location: [facility] ENDOSCOPY;  Service: Gastroenterology;  Laterality: N/A;  . CORONARY ARTERY BYPASS GRAFT N/A 08/03/2014   Procedure: CORONARY ARTERY BYPASS GRAFTING (CABG);  Surgeon: [name], MD;  Location: [facility];  Service: Open Heart Surgery;  Laterality: N/A;  . EP IMPLANTABLE DEVICE N/A 05/01/2015   MDT ICD implanted for primary prevention of sudden death  . ESOPHAGOGASTRODUODENOSCOPY N/A 06/28/2020   Procedure: ESOPHAGOGASTRODUODENOSCOPY (EGD);  Surgeon: Clarene Essex, MD;  Location: [place];  Service: Endoscopy;  Laterality: N/A;  . ESOPHAGOGASTRODUODENOSCOPY (EGD) WITH PROPOFOL N/A 04/16/2015   Procedure: ESOPHAGOGASTRODUODENOSCOPY (EGD) WITH PROPOFOL;  Surgeon: [name], MD;  Location: [facility] ENDOSCOPY;  Service: Endoscopy;  Laterality: N/A;  .  ESOPHAGOGASTRODUODENOSCOPY (EGD) WITH PROPOFOL N/A 08/24/2017   Procedure: ESOPHAGOGASTRODUODENOSCOPY (EGD) WITH PROPOFOL;  Surgeon: [name], MD;  Location: [facility] ENDOSCOPY;  Service: Endoscopy;  Laterality: N/A;  . FRACTURE SURGERY Left    ORIF lower leg  . HERNIA REPAIR    . HIP PINNING,CANNULATED Left 07/22/2018   Procedure: CANNULATED HIP PINNING;  Surgeon: [name], MD;  Location: [facility];  Service: Orthopedics;  Laterality: Left;  . MITRAL VALVE  REPAIR N/A 12/30/2018   Procedure: MITRAL VALVE REPAIR;  Surgeon: [name], MD;  Location: [facility];  Service: Cardiovascular;  Laterality: N/A;  . RIGHT/LEFT HEART CATH AND CORONARY/GRAFT ANGIOGRAPHY N/A 12/21/2018   Procedure: RIGHT/LEFT HEART CATH AND CORONARY/GRAFT ANGIOGRAPHY;  Surgeon: [name], MD;  Location: [facility];  Service: Cardiovascular;  Laterality: N/A;  . TEE WITHOUT CARDIOVERSION N/A 08/03/2014   Procedure: TRANSESOPHAGEAL ECHOCARDIOGRAM (TEE);  Surgeon: [name], MD;  Location: [facility];  Service: Open Heart Surgery;  Laterality: N/A;  . TEE WITHOUT CARDIOVERSION N/A 02/10/2018   Procedure: TRANSESOPHAGEAL ECHOCARDIOGRAM (TEE);  Surgeon: [name], MD;  Location: [facility] ENDOSCOPY;  Service: Cardiovascular;  Laterality: N/A;  . TEE WITHOUT CARDIOVERSION N/A 11/09/2019   Procedure: TRANSESOPHAGEAL ECHOCARDIOGRAM (TEE);  Surgeon: [name], MD;  Location: [facility] ENDOSCOPY;  Service: Cardiovascular;  Laterality: N/A;    Prior to Admission medications   Medication Sig Start Date End Date Taking? Authorizing Provider  acetaminophen (TYLENOL) 500 MG tablet Take 500-1,000 mg by mouth every 6 (six) hours as needed (for pain.).   Yes [provider]  allopurinol (ZYLOPRIM) 100 MG tablet Take 100 mg by mouth daily.  02/08/19  Yes [provider]  amiodarone (PACERONE) 200 MG tablet Take 1 tablet (200 mg total) by mouth daily. 04/13/20  Yes Bensimhon, Shaune Pascal, MD  Ascorbic Acid (VITAMIN C) 100 MG tablet Take 100 mg by mouth daily.   Yes [provider]  atorvastatin (LIPITOR) 40 MG tablet Take 40 mg by mouth every evening.   Yes [provider]  carvedilol (COREG) 3.125 MG tablet Take 1 tablet (3.125 mg total) by mouth 2 (two) times daily. 05/21/20  Yes Consuelo Pandy, PA-C  Cholecalciferol 50 MCG (2000 UT) TABS Take 2,000 Units by mouth at bedtime.    Yes [provider]  cyanocobalamin 500 MCG tablet  Take 500 mcg by mouth daily.    Yes [provider]  docusate sodium (COLACE) 100 MG capsule Take 100 mg by mouth daily as needed for mild constipation.   Yes [provider]  DULoxetine (CYMBALTA) 30 MG capsule Take 30 mg by mouth at bedtime.    Yes [provider]  ELIQUIS 5 MG TABS tablet Take 5 mg by mouth 2 (two) times daily. 07/14/20  Yes [provider]  ferrous sulfate 325 (65 FE) MG tablet Take 1 tablet (325 mg total) by mouth daily with breakfast. 02/09/18  Yes [name], MD  gabapentin (NEURONTIN) 300 MG capsule Take 300 mg by mouth at bedtime.    Yes [provider]  ketoconazole (NIZORAL) 2 % shampoo Apply 1 application topically 2 (two) times a week. Mon and Thu 09/28/18  Yes [provider]  lactulose (CHRONULAC) 10 GM/15ML solution Take 30 mLs (20 g total) by mouth daily. 07/05/20  Yes Gaylan Gerold, DO  metolazone (ZAROXOLYN) 2.5 MG tablet Take 1 tablet twice a week, every Monday and Thursday. Take 20 mEq Potassium with Metolazone. 07/30/20  Yes [name]  S, MD  Multiple Vitamin (MULTIVITAMIN) tablet Take 1 tablet by mouth at bedtime.    Yes [provider]  pantoprazole (PROTONIX) 40 MG tablet TAKE 1 TABLET BY MOUTH EVERY DAY Patient taking differently: Take 40 mg by mouth daily. 12/08/19  Yes [name], MD  potassium chloride SA (KLOR-CON M20) 20 MEQ tablet Take 1 tablet (20 mEq total) by mouth daily. Patient taking differently: Take 20-40 mEq by mouth See admin instructions. Take 1 tablet (13meq) by mouth daily - take 1 additional tablet (57meq) on days with metolazone 06/06/20  Yes [name], MD  sucralfate (CARAFATE) 1 g tablet Take 1 tablet (1 g total) by mouth 2 (two) times daily. 07/05/20  Yes Gaylan Gerold, DO  torsemide (DEMADEX) 100 MG tablet Take 1 tablet (100 mg total) by mouth daily. 08/02/20   Lyda Jester M, PA-C    Allergies as of 08/02/2020 - Review Complete 08/02/2020   Allergen Reaction Noted  . Spironolactone Other (See Comments) 02/20/2017    Family History  Problem Relation Age of Onset  . Valvular heart disease Mother        Ruptured valve  . CAD Father   . Heart Problems Brother        Stents x 4  . Diabetes Brother   . Prostate cancer Neg Hx   . Bladder Cancer Neg Hx   . Kidney cancer Neg Hx     Social History   Socioeconomic History  . Marital status: Single    Spouse name: Not on file  . Number of children: Not on file  . Years of education: Not on file  . Highest education level: Not on file  Occupational History  . Not on file  Tobacco Use  . Smoking status: Former Smoker    Quit date: 2014    Years since quitting: 7.9  . Smokeless tobacco: Never Used  . Tobacco comment: 07/30/2017 Quit in 2014  Vaping Use  . Vaping Use: Never used  Substance and Sexual Activity  . Alcohol use: No    Alcohol/week: 0.0 standard drinks  . Drug use: No  . Sexual activity: Never  Other Topics Concern  . Not on file  Social History Narrative   Pt lives in [place] with his mother.   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 Neck:  Supple; no masses or thyromegaly. Lungs:  Clear throughout to auscultation, normal respiratory effort.    Heart:  +S1, +S2, Regular rate and rhythm, No edema. Abdomen:  Soft, nontender and nondistended. Normal  bowel sounds, without guarding, and without rebound.   Neurologic:  Alert and  oriented x4;  grossly normal neurologically.  Impression/Plan: Carl [name] is here for a push enteroscopy   to be performed for  evaluation of upper GI bleed    Risks, benefits, limitations, and alternatives regarding endoscopy have been reviewed with the patient.  Questions have been answered.  All parties agreeable.   Carl Bellows, MD  08/06/2020, 7:43 AM

## 2020-08-06 NOTE — Anesthesia Postprocedure Evaluation (Signed)
Anesthesia Post Note  Patient: Carl [name]  Procedure(s) Performed: ENTEROSCOPY (N/A )  Patient location during evaluation: Endoscopy Anesthesia Type: General Level of consciousness: awake and alert Pain management: pain level controlled Vital Signs Assessment: post-procedure vital signs reviewed and stable Respiratory status: spontaneous breathing, nonlabored ventilation, respiratory function stable and patient connected to nasal cannula oxygen Cardiovascular status: blood pressure returned to baseline and stable Postop Assessment: no apparent nausea or vomiting Anesthetic complications: no   No complications documented.   Last Vitals:  Vitals:   08/06/20 0822 08/06/20 0827  BP: 95/63 95/61  Pulse:    Resp:    Temp:    SpO2: 97%     Last Pain:  Vitals:   08/06/20 0827  TempSrc:   PainSc: Asleep                 Arita Miss

## 2020-08-06 NOTE — Op Note (Addendum)
[facility] Gastroenterology Patient Name: Carl [name] Procedure Date: 08/06/2020 7:49 AM MRN: [id number] Account #: [id number] Date of Birth: March 08, 1951 Admit Type: Inpatient Age: 69 Room: [facility] ENDO ROOM 4 Gender: Male Note Status: Supervisor Override Procedure:             Small bowel enteroscopy Indications:           [place] Providers:             Jonathon Bellows MD, MD Medicines:             Monitored Anesthesia Care Complications:         No immediate complications. Procedure:             Pre-Anesthesia Assessment:                        - Prior to the procedure, a History and Physical was                         performed, and patient medications, allergies and                         sensitivities were reviewed. The patient's tolerance                         of previous anesthesia was reviewed.                        - The risks and benefits of the procedure and the                         sedation options and risks were discussed with the                         patient. All questions were answered and informed                         consent was obtained.                        - ASA Grade Assessment: III - A patient with severe                         systemic disease.                        After obtaining informed consent, the endoscope was                         passed under direct vision. Throughout the procedure,                         the patient's blood pressure, pulse, and oxygen                         saturations were monitored continuously. The                         Colonoscope was introduced through the mouth and  advanced to the mid-jejunum. The small bowel                         enteroscopy was accomplished with ease. The patient                         tolerated the procedure well. Findings:      The esophagus was normal.      The stomach was normal.      Moderate portal hypertensive gastropathy was found in  the entire       examined stomach.      Four angioectasias with no bleeding were found in the proximal jejunum.       Coagulation for hemostasis using argon beam at 0.5 liters/minute and 60       watts was successful. Initially used the genii machine - ablated one AVM       with no issue , when I ablated the second tiny AVM with a very short       pulse felt the burn caused was very deep unexpectedly for this device       and immediately placed 2 clips at the site with good closure. Changed       machine to ERBE for remainder of AVM's ablation Impression:            - Normal esophagus.                        - Normal stomach.                        - Portal hypertensive gastropathy.                        - Four non-bleeding angioectasias in the jejunum.                         Treated with argon beam coagulation.                        - No specimens collected. Recommendation:        - Return patient to hospital ward for ongoing care.                        - Clear liquid diet today. Procedure Code(s):     --- Professional ---                        [phone], Small intestinal endoscopy, enteroscopy beyond                         second portion of duodenum, not including ileum; with                         control of bleeding (eg, injection, bipolar cautery,                         unipolar cautery, laser, heater probe, stapler, plasma                         coagulator) Diagnosis Code(s):     --- Professional ---  K76.6, Portal hypertension                        K31.89, Other diseases of stomach and duodenum                        K55.20, Angiodysplasia of colon without hemorrhage                        K92.1, Melena (includes Hematochezia) CPT copyright 2019 American Medical Association. All rights reserved. The codes documented in this report are preliminary and upon coder review may  be revised to meet current compliance requirements. Jonathon Bellows, MD Jonathon Bellows  MD, MD 08/06/2020 8:20:53 AM This report has been signed electronically. Number of Addenda: 0 Note Initiated On: 08/06/2020 7:49 AM Estimated Blood Loss:  Estimated blood loss: none.      [facility]

## 2020-08-06 NOTE — Transfer of Care (Signed)
Immediate Anesthesia Transfer of Care Note  Patient: Carl [name]  Procedure(s) Performed: ENTEROSCOPY (N/A )  Patient Location: PACU and Endoscopy Unit  Anesthesia Type:General  Level of Consciousness: drowsy  Airway & Oxygen Therapy: Patient Spontanous Breathing and Patient connected to nasal cannula oxygen  Post-op Assessment: Report given to RN  Post vital signs: stable  Last Vitals:  Vitals Value Taken Time  BP    Temp    Pulse    Resp    SpO2      Last Pain:  Vitals:   08/06/20 0707  TempSrc: (P) Temporal  PainSc:          Complications: No complications documented.

## 2020-08-07 ENCOUNTER — Encounter: Payer: Self-pay | Admitting: Gastroenterology

## 2020-08-07 ENCOUNTER — Other Ambulatory Visit (HOSPITAL_COMMUNITY): Payer: Medicare Other

## 2020-08-07 DIAGNOSIS — Z7189 Other specified counseling: Secondary | ICD-10-CM

## 2020-08-07 DIAGNOSIS — D62 Acute posthemorrhagic anemia: Principal | ICD-10-CM

## 2020-08-07 LAB — CBC WITH DIFFERENTIAL/PLATELET
Abs Immature Granulocytes: 0.06 10*3/uL (ref 0.00–0.07)
Basophils Absolute: 0 10*3/uL (ref 0.0–0.1)
Basophils Relative: 0 %
Eosinophils Absolute: 0.2 10*3/uL (ref 0.0–0.5)
Eosinophils Relative: 4 %
HCT: 26.5 % — ABNORMAL LOW (ref 39.0–52.0)
Hemoglobin: 8.4 g/dL — ABNORMAL LOW (ref 13.0–17.0)
Immature Granulocytes: 1 %
Lymphocytes Relative: 9 %
Lymphs Abs: 0.6 10*3/uL — ABNORMAL LOW (ref 0.7–4.0)
MCH: 31.2 pg (ref 26.0–34.0)
MCHC: 31.7 g/dL (ref 30.0–36.0)
MCV: 98.5 fL (ref 80.0–100.0)
Monocytes Absolute: 0.7 10*3/uL (ref 0.1–1.0)
Monocytes Relative: 11 %
Neutro Abs: 5.1 10*3/uL (ref 1.7–7.7)
Neutrophils Relative %: 75 %
Platelets: 233 10*3/uL (ref 150–400)
RBC: 2.69 MIL/uL — ABNORMAL LOW (ref 4.22–5.81)
RDW: 25.3 % — ABNORMAL HIGH (ref 11.5–15.5)
Smear Review: NORMAL
WBC: 6.7 10*3/uL (ref 4.0–10.5)
nRBC: 0.6 % — ABNORMAL HIGH (ref 0.0–0.2)

## 2020-08-07 LAB — PROTIME-INR
INR: 1.3 — ABNORMAL HIGH (ref 0.8–1.2)
Prothrombin Time: 16.1 seconds — ABNORMAL HIGH (ref 11.4–15.2)

## 2020-08-07 MED ORDER — ADULT MULTIVITAMIN W/MINERALS CH
1.0000 | ORAL_TABLET | Freq: Every day | ORAL | Status: DC
  Administered 2020-08-08 – 2020-08-10 (×3): 1 via ORAL
  Filled 2020-08-07 (×3): qty 1

## 2020-08-07 MED ORDER — ENSURE ENLIVE PO LIQD
237.0000 mL | Freq: Three times a day (TID) | ORAL | Status: DC
  Administered 2020-08-08 – 2020-08-10 (×8): 237 mL via ORAL

## 2020-08-07 MED ORDER — PANTOPRAZOLE SODIUM 40 MG PO TBEC
40.0000 mg | DELAYED_RELEASE_TABLET | Freq: Two times a day (BID) | ORAL | Status: DC
  Administered 2020-08-08 – 2020-08-10 (×6): 40 mg via ORAL
  Filled 2020-08-07 (×6): qty 1

## 2020-08-07 NOTE — Progress Notes (Signed)
Carl Lame, MD [facility]   [address] [place], [place] [address] Phone: [phone] Fax : [phone]   Subjective: This patient underwent an EGD with AVMs burnt and clipped yesterday by Dr. Vicente Males.  The patient has a left ventricular ejection fraction of 20% and chronic kidney disease.  The patient had an EGD and colonoscopy in November without any bleeding seen.  The patient had a capsule endoscopy which shows some small bowel bleeding in the upper small bowel with dark material throughout the whole intestines.  The patient was also found to have hypertensive portal gastropathy.  The patient's hemoglobin has been stable.   Objective: Vital signs in last 24 hours: Vitals:   08/06/20 2057 08/07/20 0405 08/07/20 0800 08/07/20 1200  BP: 96/66 95/77 97/85  110/90  Pulse:  76 90 93  Resp: 20 19 20  (!) 21  Temp: 97.9 F (36.6 C) (!) 97.4 F (36.3 C) 97.9 F (36.6 C) 98.1 F (36.7 C)  TempSrc: Axillary Oral Oral Oral  SpO2: 98% 96% 93% 96%  Weight:  66.2 kg    Height:       Weight change: -0.635 kg  Intake/Output Summary (Last 24 hours) at 08/07/2020 1439 Last data filed at 08/07/2020 0900 Gross per 24 hour  Intake 600 ml  Output 750 ml  Net -150 ml     Exam: Heart:: Regular rate and rhythm, S1S2 present or without murmur or extra heart sounds Lungs: normal, clear to auscultation and clear to auscultation and percussion Abdomen: soft, nontender, normal bowel sounds   Lab Results: @LABTEST2 @ Micro Results: Recent Results (from the past 240 hour(s))  Resp Panel by RT-PCR (Flu A&B, Covid) Nasopharyngeal Swab     Status: None   Collection Time: 08/02/20 10:54 PM   Specimen: Nasopharyngeal Swab; Nasopharyngeal(NP) swabs in vial transport medium  Result Value Ref Range Status   SARS Coronavirus 2 by RT PCR NEGATIVE NEGATIVE Final    Comment: (NOTE) SARS-CoV-2 target nucleic acids are NOT DETECTED.  The SARS-CoV-2 RNA is generally detectable in upper  respiratory specimens during the acute phase of infection. The lowest concentration of SARS-CoV-2 viral copies this assay can detect is 138 copies/mL. A negative result does not preclude SARS-Cov-2 infection and should not be used as the sole basis for treatment or other patient management decisions. A negative result may occur with  improper specimen collection/handling, submission of specimen other than nasopharyngeal swab, presence of viral mutation(s) within the areas targeted by this assay, and inadequate number of viral copies(<138 copies/mL). A negative result must be combined with clinical observations, patient history, and epidemiological information. The expected result is Negative.  Fact Sheet for Patients:  EntrepreneurPulse.com.au  Fact Sheet for Healthcare Providers:  IncredibleEmployment.be  This test is no t yet approved or cleared by the [place] FDA and  has been authorized for detection and/or diagnosis of SARS-CoV-2 by FDA under an Emergency Use Authorization (EUA). This EUA will remain  in effect (meaning this test can be used) for the duration of the COVID-19 declaration under Section 564(b)(1) of the Act, 21 U.S.C.section 360bbb-3(b)(1), unless the authorization is terminated  or revoked sooner.       Influenza A by PCR NEGATIVE NEGATIVE Final   Influenza B by PCR NEGATIVE NEGATIVE Final    Comment: (NOTE) The Xpert Xpress SARS-CoV-2/FLU/RSV plus assay is intended as an aid in the diagnosis of influenza from Nasopharyngeal swab specimens and should not be used as a sole basis for treatment. Nasal washings and aspirates  are unacceptable for Xpert Xpress SARS-CoV-2/FLU/RSV testing.  Fact Sheet for Patients: EntrepreneurPulse.com.au  Fact Sheet for Healthcare Providers: IncredibleEmployment.be  This test is not yet approved or cleared by the [place] FDA and has been  authorized for detection and/or diagnosis of SARS-CoV-2 by FDA under an Emergency Use Authorization (EUA). This EUA will remain in effect (meaning this test can be used) for the duration of the COVID-19 declaration under Section 564(b)(1) of the Act, 21 U.S.C. section 360bbb-3(b)(1), unless the authorization is terminated or revoked.  Performed at [facility], [address] [place], [place] [address]    Studies/Results: No results found. Medications: I have reviewed the patient's current medications. Scheduled Meds: . sodium chloride   Intravenous Once  . allopurinol  100 mg Oral Daily  . amiodarone  200 mg Oral Daily  . ascorbic acid  250 mg Oral Daily  . atorvastatin  40 mg Oral QPM  . Chlorhexidine Gluconate Cloth  6 each Topical Daily  . DULoxetine  30 mg Oral QHS  . pantoprazole  40 mg Oral BID  . sucralfate  1 g Oral BID  . vitamin B-12  500 mcg Oral Daily   Continuous Infusions: PRN Meds:.acetaminophen   Assessment: Principal Problem:   Acute on chronic blood loss anemia Active Problems:   GERD (gastroesophageal reflux disease)   Hypokalemia   Chronic systolic CHF (congestive heart failure) (H[facility])   Cirrhosis ([facility])   Acute kidney injury superimposed on CKD ([place])   Leukocytosis    Plan: The patient has had no further sign of bleeding.  The patient had treatment of multiple AVMs on the upper endoscopy.  The patient will have his hemoglobin followed and if stable may be discharged from GI point of view.   LOS: 5 days   Carl [name] 08/07/2020, 2:39 PM Pager [phone] 7am-5pm  Check AMION for 5pm -7am coverage and on weekends

## 2020-08-07 NOTE — Care Management Important Message (Signed)
Important Message  Patient Details  Name: Carl [name] MRN: [id number] Date of Birth: 06/29/1951   Medicare Important Message Given:  Yes     Dannette Barbara 08/07/2020, 11:45 AM

## 2020-08-07 NOTE — Progress Notes (Signed)
Daily Progress Note   Patient Name: Carl [name]       Date: 08/07/2020 DOB: March 09, 1951  Age: 69 y.o. MRN#: [id number] Attending Physician: Darliss Cheney, MD Primary Care Physician: [name], MD Admit Date: 08/02/2020  Reason for Consultation/Follow-up: Establishing goals of care  Subjective: Patient awake, alert, oriented. No complaints this afternoon.   GOC: No family at bedside. Introduced role of palliative medicine. Patient is familiar with palliative from home visit.   Reviewed care plan with patient explaining recommendation for SNF rehab. Patient has participated in rehab at [facility] in the past and would prefer to discharge there again. Reassured that [facility] team is following for placement needs.   Patient confirms his decision for DNR code status. He tells me he does not have a documented living will/POA but would prefer his brother [name]) serve as primary HCPOA. Encouraged completion of HCPOA paperwork. Patient gives me permission to call brother [name] to update and discuss recommendations.   **Spoke with brother [name]. Provided update on plan of care. Encouraged consideration of AD packet completion. [name] can meet with [name] and I tomorrow afternoon 12/15 at 1pm. Will plan to review AD packet and MOST form.    Length of Stay: 5  Current Medications: Scheduled Meds:  . sodium chloride   Intravenous Once  . allopurinol  100 mg Oral Daily  . amiodarone  200 mg Oral Daily  . ascorbic acid  250 mg Oral Daily  . atorvastatin  40 mg Oral QPM  . Chlorhexidine Gluconate Cloth  6 each Topical Daily  . DULoxetine  30 mg Oral QHS  . pantoprazole  40 mg Oral BID  . sucralfate  1 g Oral BID  . vitamin B-12  500 mcg Oral Daily    Continuous Infusions:   PRN  Meds: acetaminophen  Physical Exam Vitals and nursing note reviewed.  Constitutional:      General: He is awake.     Appearance: He is ill-appearing.  HENT:     Head: Normocephalic and atraumatic.  Cardiovascular:     Rate and Rhythm: Rhythm irregularly irregular.  Pulmonary:     Effort: No tachypnea, accessory muscle usage or respiratory distress.  Abdominal:     Tenderness: There is no abdominal tenderness.  Skin:    General: Skin is warm and dry.  Coloration: Skin is pale.  Neurological:     Mental Status: He is alert and oriented to person, place, and time.  Psychiatric:        Mood and Affect: Mood normal.        Speech: Speech is slurred.        Behavior: Behavior normal.            Vital Signs: BP 110/90   Pulse 93   Temp 98.1 F (36.7 C) (Oral)   Resp (!) 21   Ht 6\' 1"  (1.854 m)   Wt 66.2 kg   SpO2 96%   BMI 19.26 kg/m  SpO2: SpO2: 96 % O2 Device: O2 Device: Room Air O2 Flow Rate: O2 Flow Rate (L/min): 4 L/min  Intake/output summary:   Intake/Output Summary (Last 24 hours) at 08/07/2020 1518 Last data filed at 08/07/2020 0900 Gross per 24 hour  Intake 600 ml  Output 750 ml  Net -150 ml   LBM: Last BM Date: 08/05/20 Baseline Weight: Weight: 72.6 kg Most recent weight: Weight: 66.2 kg       Palliative Assessment/Data: PPS 40%      Patient Active Problem List   Diagnosis Date Noted  . Acute kidney injury superimposed on CKD ([place]) 08/03/2020  . Leukocytosis 08/03/2020  . Acute on chronic blood loss anemia 08/02/2020  . Pressure injury of skin 06/29/2020  . Hyponatremia 06/27/2020  . GI bleed 06/27/2020  . 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 Persistent atrial fibrillation ([place]) 11/30/2019  . Secondary hypercoagulable state ([place]) 11/30/2019  . Arm DVT (deep venous thromboembolism), acute, left ([place]) 11/26/2019  . Hx of adenomatous colonic polyps 05/18/2019  . Cirrhosis ([place]) 05/12/2019  . Occult blood positive stool 05/12/2019  .  HCAP (healthcare-associated pneumonia) 03/04/2019  . Hallucinations 02/10/2019  . Elevated blood sugar 01/27/2019  . S/P mitral valve repair 12/31/2018  . Bilateral pulmonary contusion 10/17/2018  . Cardiac LV ejection fraction of 20-34% 10/17/2018  . Chronic systolic CHF (congestive heart failure) ([place]) 10/17/2018  . History of ischemic cardiomyopathy 10/17/2018  . ICD (implantable cardioverter-defibrillator) in place 10/17/2018  . S/p left hip fracture 10/17/2018  . Traumatic retroperitoneal hematoma 10/17/2018  . Age-related osteoporosis with current pathological fracture with routine healing 07/28/2018  . CAD (coronary artery disease) 07/21/2018  . GERD (gastroesophageal reflux disease) 07/21/2018  . Depression 07/21/2018  . CKD (chronic kidney disease), stage III ([place]) 07/21/2018  . Iron deficiency anemia 07/21/2018  . Hypokalemia 07/21/2018  . Closed left hip fracture ([place]) 07/21/2018  . Fracture of femoral neck, left ([place]) 07/21/2018  . Lower extremity pain, bilateral 11/12/2017  . 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 Hyperlipidemia 09/01/2017  . Essential hypertension 09/01/2017  . PAD (peripheral artery disease) ([place]) 09/01/2017  . Symptomatic anemia 08/06/2017  . GAD (generalized anxiety disorder) 06/18/2017  . Prostate cancer screening 04/28/2017  . Hydrocele 04/28/2017  . Bradycardia   . Premature ventricular contraction   . Severe mitral insufficiency   . Cardiomyopathy, ischemic 04/26/2015  . Difficulty hearing 12/29/2014  . Acute on chronic systolic CHF (congestive heart failure) ([place]) 10/10/2014  . 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 Protein-calorie malnutrition, severe ([place]) 07/29/2014  . AKI (acute kidney injury) ([place]) 07/29/2014    Palliative Care Assessment & Plan   Patient Profile: PHARAOH PIO a 69 y.o.malewith medical history significant forchronic systolic heart failure with most recent echocardiogram showing LVEF 20% status  post AICD placement, paroxysmal atrial fibrillation chronically anticoagulated on Eliquis,  stage IV chronic kidney disease with baseline creatinine 2.2-2.7, chronic blood loss anemia with baseline hemoglobin of 7-9, cirrhosis,who is [facility] on 12/9/2021with acute on chronic blood loss anemiaafter presenting from home to [facility] Emergency Department for evaluation of ground-level mechanical fall.Noncontrast CT of the head showed no evidence of acute intracranial process, including no evidence of intracranial hemorrhage. CT of the cervical spine showed no evidence of acute fracture or malalignment of the cervical spine. Recent EGD and colonoscopy unremarkable. Palliative medicine consultation for goals of care.   Assessment: Acute on chronic blood loss anemia GI bleeding Chronic systolic CHF with EF 40% Hypotension  AKI on CKD stage IIIb Fall Liver cirrhosis Generalized weakness and deconditioning  Recommendations/Plan: Patient confirms DNR code status. Durable DNR uploaded into EMR. Patient reports he has not documented a living will or HCPOA. Encouraged documentation completion. Patient requests brother [name]) as primary HCPOA. PMT provider will meet with patient and brother, [name] tomorrow 12/15 at 1pm to review AD packet and MOST form. Plan is for SNF rehab. Continue outpatient palliative f/u.    Code Status: DNR   Code Status Orders  (From admission, onward)         Start     Ordered   08/03/20 0050  Do not attempt resuscitation (DNR)  Continuous       Question Answer Comment  In the event of cardiac or respiratory ARREST Do not call a "code blue"   In the event of cardiac or respiratory ARREST Do not perform Intubation, CPR, defibrillation or ACLS   In the event of cardiac or respiratory ARREST Use medication by any route, position, wound care, and other measures to relive pain and suffering. May use oxygen, suction and manual treatment of  airway obstruction as needed for comfort.      08/03/20 0050        Code Status History    Date Active Date Inactive Code Status Order ID Comments User Context   06/27/2020 1452 07/05/2020 1859 DNR [id number]  [name], MD ED   03/04/2019 0243 03/05/2019 1509 Full Code [id number]  Lance Coon, MD ED   02/10/2019 0517 02/10/2019 2006 Full Code [id number]  Antonietta Breach, PA-C ED   12/30/2018 1257 12/31/2018 1732 Full Code [id number]  [name], MD Inpatient   12/21/2018 0912 12/21/2018 1540 Full Code [id number]  [name], MD Inpatient   07/21/2018 1944 07/27/2018 1554 Full Code [id number]  Ivor Costa, MD ED   06/11/2017 0538 06/12/2017 1547 Full Code [id number]  Harrie Foreman, MD Inpatient   06/10/2016 1916 06/12/2016 2340 Full Code [id number]  Maryellen Pile, MD Inpatient   05/01/2015 1514 05/02/2015 1548 Full Code [id number]  Thompson Grayer, MD Inpatient   08/03/2014 1332 08/17/2014 1636 Full Code [id number]  John Giovanni, PA-C Inpatient   07/27/2014 1646 08/03/2014 1332 Full Code [id number]  Erlene Quan, PA-C Inpatient   Advance Care Planning Activity      Prognosis: Poor prognosis  Discharge Planning: [facility] for rehab with Palliative care service follow-up  Care plan was discussed with RN, patient, brother [name])  Thank you for allowing the Palliative Medicine Team to assist in the care of this patient.   Total Time 25 Prolonged Time Billed  no      Greater than 50%  of this time was spent counseling and coordinating care related to the above assessment and plan.  Ihor Dow, DNP, FNP-C Palliative Medicine Team  Phone: [phone] Fax: [phone]  Please contact Palliative Medicine Team phone at [phone] for questions and concerns.

## 2020-08-07 NOTE — Progress Notes (Signed)
PROGRESS NOTE    Carl [name]  KPT:[id number] DOB: August 25, 1951 DOA: 08/02/2020 PCP: [name], MD   Brief Narrative:  Carl [name] is a 69 y.o. male with medical history significant for chronic systolic heart failure with most recent echocardiogram showing LVEF 20% status post AICD placement, paroxysmal atrial fibrillation chronically anticoagulated on Eliquis, stage IV chronic kidney disease with baseline creatinine 2.2-2.7, chronic blood loss anemia with baseline hemoglobin of 7-9, cirrhosis, who presented to ED due to mechanical fall and was admitted to [facility] on 08/02/2020 with fall as well as acute on chronic blood loss anemia after presenting from home.  No syncope.  No prodromal symptoms. He is chronically anticoagulated on Eliquis in the setting of paroxysmal atrial fibrillation.  Denies any use of NSAIDs.   Upon arrival to ED, his initial blood pressure was 76/40.  He received IV fluid bolus. Hemoglobin was 6.8, relative to his baseline range of 7-9, with most recent prior hemoglobin noted to be 8.3. Noncontrast CT of the head was unremarkable.  He was admitted to hospital service and started on Protonix drip and GI was consulted.  Reportedly he recently underwent colonoscopy via Dr. [name] on 07/23/2020, as well as preceding EGD on 06/28/2020 via Dr. Watt Climes which were unremarkable so this time he underwent capsule endoscopy which showed gastric as well as upper small bowel bleeding.  Distal small bowel was not visualized.  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  Patient's hemoglobin has remained stable for last several days.  Eliquis on hold.  GI recommends monitoring until tomorrow and then discharging if remains a stable.  Of note, initially patient's blood pressure was also low which improved.  Assessment & Plan:   Principal Problem:   Acute on chronic blood loss anemia Active Problems:   GERD  (gastroesophageal reflux disease)   Hypokalemia   Chronic systolic CHF (congestive heart failure) (HCC)   Cirrhosis (HCC)   Acute kidney injury superimposed on CKD (HCC)   Leukocytosis   Acute on chronic blood loss anemia due to possible upper GI bleed: Reportedly, patient had EGD as well as colonoscopy recently and both of them were unremarkable.  He also saw hematology oncology due to persistent anemia and was recommended weekly IV iron.  Patient yet once again presented with acute on chronic anemia with hemoglobin of 6.8.  He received 1 unit of PRBC transfusion on the night of 08/02/2020.  Hemoglobin improved to 7.9 and dropped again to 6.8.  On the morning of 08/03/2020 and he received another unit of transfusion.  Hemoglobin has remained stable since then.  Continue on Protonix drip.  GI on board.  Per GI note, capsule endoscopy indicates that he is likely bleeding from AVMs in the stomach and proximal part of the small intestine.  Distal part of the small intestine was not evaluated.  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  His hemoglobin has remained stable since last 4 days.  Continue to hold Eliquis and monitor for another 24 hours per GI recommendations  Persistent hypotension: Patient's blood pressure has improved now but is still at low normal side.  Holding torsemide and carvedilol  Acute kidney injury on chronic kidney disease stage IIIb: Could very well be due to upper GI bleed and dehydration is well as.only slight improvement in creatinine.  Renal function back to baseline.  Ground-level mechanical fall:  Likely due to weakness secondary to anemia.  PT OT consulted.  They recommend SNF.  TOC on board  Hypokalemia: Resolved.  Leukocytosis: Nonspecific and unremarkable which resolved.  No signs of infection.   Chronic systolic heart failure: with most recent echocardiogram showing evidence of left ventricular  ejection fraction of 20%, status post AICD.  He is on torsemide 100 mg p.o. daily.  In addition to Coreg.  He appears euvolemic so we will hold torsemide in the setting of low blood pressure and elevated creatinine  Paroxysmal atrial fibrillation: Chronically anticoagulated on Eliquis which is on hold due to GI bleed as above.  Continue amiodarone and lower dose of carvedilol.  Liver cirrhosis: Compensated.  Generalized weakness/deconditioning/multiple medical problems: Seen by palliative care.  Not interested in hospice.  He sees outpatient palliative care.  Remains DNR.  Seen by PT OT and they recommend SNF.  TOC consulted.  DVT prophylaxis: SCDs Start: 08/03/20 0049   Code Status: DNR  Family Communication: None present at bedside today.  Status is: Inpatient  Remains inpatient appropriate because:Inpatient level of care appropriate due to severity of illness   Dispo: The patient is from: Home              Anticipated d/c is to: SNF              Anticipated d/c date is: 1 to 2 days              Patient currently is medically stable to d/c.    Estimated body mass index is 19.26 kg/m as calculated from the following:   Height as of this encounter: 6\' 1"  (1.854 m).   Weight as of this encounter: 66.2 kg.  Pressure Injury 06/28/20 Hip Left Stage 1 -  Intact skin with non-blanchable redness of a localized area usually over a bony prominence. reddened spot, partially blanchable (Active)  06/28/20 2000  Location: Hip  Location Orientation: Left  Staging: Stage 1 -  Intact skin with non-blanchable redness of a localized area usually over a bony prominence.  Wound Description (Comments): reddened spot, partially blanchable  Present on Admission:      Pressure Injury 06/28/20 Sacrum Mid Stage 1 -  Intact skin with non-blanchable redness of a localized area usually over a bony prominence. circular, quarter sized (Active)  06/28/20 2000  Location: Sacrum  Location Orientation: Mid   Staging: Stage 1 -  Intact skin with non-blanchable redness of a localized area usually over a bony prominence.  Wound Description (Comments): circular, quarter sized  Present on Admission:      Nutritional status:               Consultants:   GI  Procedures:   Capsule endoscopy and push enteroscopy  Antimicrobials:  Anti-infectives (From admission, onward)   Start     Dose/Rate Route Frequency Ordered Stop   08/03/20 0100  ciprofloxacin (CIPRO) IVPB 400 mg  Status:  Discontinued        400 mg 200 mL/hr over 60 Minutes Intravenous Every 24 hours 08/03/20 0056 08/03/20 0806         Subjective: Patient seen and examined.  Alert but partially oriented.  At his baseline.  Objective: Vitals:   08/06/20 2057 08/07/20 0405 08/07/20 0800 08/07/20 1200  BP: 96/66 95/77 97/85  110/90  Pulse:  76 90 93  Resp: 20 19 20  (!) 21  Temp: 97.9 F (36.6 C) (!) 97.4 F (36.3 C) 97.9 F (36.6 C) 98.1 F (  36.7 C)  TempSrc: Axillary Oral Oral Oral  SpO2: 98% 96% 93% 96%  Weight:  66.2 kg    Height:        Intake/Output Summary (Last 24 hours) at 08/07/2020 1342 Last data filed at 08/07/2020 0900 Gross per 24 hour  Intake 600 ml  Output 750 ml  Net -150 ml   Filed Weights   08/05/20 0500 08/06/20 0531 08/07/20 0405  Weight: 67.3 kg 66.9 kg 66.2 kg    Examination:  General exam: Appears calm and comfortable  Respiratory system: Clear to auscultation. Respiratory effort normal. Cardiovascular system: S1 & S2 heard, RRR. No JVD, murmurs, rubs, gallops or clicks. No pedal edema. Gastrointestinal system: Abdomen is nondistended, soft and nontender. No organomegaly or masses felt. Normal bowel sounds heard. Central nervous system: Alert and oriented X3.  No focal neurological deficits. Extremities: Symmetric 5 x 5 power. Skin: No rashes, lesions or ulcers.  Psychiatry: Judgement and insight appear poor.   Data Reviewed: I have personally reviewed following labs and  imaging studies  CBC: Recent Labs  Lab 08/03/20 0421 08/03/20 0922 08/03/20 2042 08/04/20 0539 08/05/20 0440 08/06/20 0637 08/07/20 0523  WBC 8.1  --   --  8.9 11.5* 8.8 6.7  NEUTROABS 6.6  --   --  7.2 9.3* 7.1 5.1  HGB 6.9*   < > 8.4* 8.7* 8.6* 8.6* 8.4*  HCT 21.6*   < > 26.2* 26.6* 26.3* 26.8* 26.5*  MCV 97.7  --   --  94.0 94.9 97.1 98.5  PLT 235  --   --  217 240 242 233   < > = values in this interval not displayed.   Basic Metabolic Panel: Recent Labs  Lab 08/02/20 2116 08/03/20 0421 08/04/20 0539 08/05/20 0440 08/06/20 0637  NA 131* 130* 133* 130* 133*  K 3.0* 2.9* 2.6* 4.3 3.8  CL 87* 88* 92* 95* 97*  CO2 30 28 28 25 25   GLUCOSE 125* 121* 123* 123* 112*  BUN 82* 89* 88* 78* 62*  CREATININE 2.91* 2.97* 2.89* 2.42* 2.03*  CALCIUM 8.8* 8.6* 8.5* 9.0 9.2  MG 2.3 2.2 2.3  --  2.2  PHOS  --  3.3  --   --   --    GFR: Estimated Creatinine Clearance: 32.2 mL/min (A) (by C-G formula based on SCr of 2.03 mg/dL (H)). Liver Function Tests: Recent Labs  Lab 08/02/20 2116 08/03/20 0421 08/04/20 0539 08/05/20 0440 08/06/20 0637  AST 49* 46* 52* 54* 49*  ALT 32 30 30 33 31  ALKPHOS 114 110 100 112 108  BILITOT 1.8* 1.7* 2.4* 2.4* 2.6*  PROT 6.2* 5.9* 5.9* 6.3* 6.0*  ALBUMIN 3.1* 3.0* 2.9* 3.0* 3.0*   No results for input(s): LIPASE, AMYLASE in the last 168 hours. No results for input(s): AMMONIA in the last 168 hours. Coagulation Profile: Recent Labs  Lab 08/03/20 0421 08/04/20 0539 08/05/20 0440 08/06/20 0637 08/07/20 0523  INR 2.4* 1.9* 1.6* 1.5* 1.3*   Cardiac Enzymes: No results for input(s): CKTOTAL, CKMB, CKMBINDEX, TROPONINI in the last 168 hours. BNP (last 3 results) No results for input(s): PROBNP in the last 8760 hours. HbA1C: No results for input(s): HGBA1C in the last 72 hours. CBG: No results for input(s): GLUCAP in the last 168 hours. Lipid Profile: No results for input(s): CHOL, HDL, LDLCALC, TRIG, CHOLHDL, LDLDIRECT in the last 72  hours. Thyroid Function Tests: No results for input(s): TSH, T4TOTAL, FREET4, T3FREE, THYROIDAB in the last 72 hours. Anemia Panel: No  results for input(s): VITAMINB12, FOLATE, FERRITIN, TIBC, IRON, RETICCTPCT in the last 72 hours. Sepsis Labs: Recent Labs  Lab 08/03/20 1520  LATICACIDVEN 1.4    Recent Results (from the past 240 hour(s))  Resp Panel by RT-PCR (Flu A&B, Covid) Nasopharyngeal Swab     Status: None   Collection Time: 08/02/20 10:54 PM   Specimen: Nasopharyngeal Swab; Nasopharyngeal(NP) swabs in vial transport medium  Result Value Ref Range Status   SARS Coronavirus 2 by RT PCR NEGATIVE NEGATIVE Final    Comment: (NOTE) SARS-CoV-2 target nucleic acids are NOT DETECTED.  The SARS-CoV-2 RNA is generally detectable in upper respiratory specimens during the acute phase of infection. The lowest concentration of SARS-CoV-2 viral copies this assay can detect is 138 copies/mL. A negative result does not preclude SARS-Cov-2 infection and should not be used as the sole basis for treatment or other patient management decisions. A negative result may occur with  improper specimen collection/handling, submission of specimen other than nasopharyngeal swab, presence of viral mutation(s) within the areas targeted by this assay, and inadequate number of viral copies(<138 copies/mL). A negative result must be combined with clinical observations, patient history, and epidemiological information. The expected result is Negative.  Fact Sheet for Patients:  EntrepreneurPulse.com.au  Fact Sheet for Healthcare Providers:  IncredibleEmployment.be  This test is no t yet approved or cleared by the [place] FDA and  has been authorized for detection and/or diagnosis of SARS-CoV-2 by FDA under an Emergency Use Authorization (EUA). This EUA will remain  in effect (meaning this test can be used) for the duration of the COVID-19 declaration under  Section 564(b)(1) of the Act, 21 U.S.C.section 360bbb-3(b)(1), unless the authorization is terminated  or revoked sooner.       Influenza A by PCR NEGATIVE NEGATIVE Final   Influenza B by PCR NEGATIVE NEGATIVE Final    Comment: (NOTE) The Xpert Xpress SARS-CoV-2/FLU/RSV plus assay is intended as an aid in the diagnosis of influenza from Nasopharyngeal swab specimens and should not be used as a sole basis for treatment. Nasal washings and aspirates are unacceptable for Xpert Xpress SARS-CoV-2/FLU/RSV testing.  Fact Sheet for Patients: EntrepreneurPulse.com.au  Fact Sheet for Healthcare Providers: IncredibleEmployment.be  This test is not yet approved or cleared by the [place] FDA and has been authorized for detection and/or diagnosis of SARS-CoV-2 by FDA under an Emergency Use Authorization (EUA). This EUA will remain in effect (meaning this test can be used) for the duration of the COVID-19 declaration under Section 564(b)(1) of the Act, 21 U.S.C. section 360bbb-3(b)(1), unless the authorization is terminated or revoked.  Performed at [facility], [address] [place], [place] [address]       Radiology Studies: No results found.  Scheduled Meds: . sodium chloride   Intravenous Once  . allopurinol  100 mg Oral Daily  . amiodarone  200 mg Oral Daily  . ascorbic acid  250 mg Oral Daily  . atorvastatin  40 mg Oral QPM  . Chlorhexidine Gluconate Cloth  6 each Topical Daily  . DULoxetine  30 mg Oral QHS  . pantoprazole  40 mg Oral BID  . sucralfate  1 g Oral BID  . vitamin B-12  500 mcg Oral Daily   Continuous Infusions:    LOS: 5 days   Time spent: 26 minutes   Darliss Cheney, MD [facility]  08/07/2020, 1:42 PM   To contact the attending provider between 7A-7P or the covering provider during after hours 7P-7A, please  log into the web site [email or web address].

## 2020-08-07 NOTE — Progress Notes (Signed)
Initial Nutrition Assessment  DOCUMENTATION CODES:   Severe malnutrition in context of chronic illness  INTERVENTION:   Ensure Enlive po TID, each supplement provides 350 kcal and 20 grams of protein  MVI daily   NUTRITION DIAGNOSIS:   Severe Malnutrition related to chronic illness as evidenced by moderate fat depletion,severe fat depletion,moderate muscle depletion,severe muscle depletion.  GOAL:   Patient will meet greater than or equal to 90% of their needs  MONITOR:   PO intake,Supplement acceptance,Labs,Weight trends,Skin,I & O's,Diet advancement  REASON FOR ASSESSMENT:   NPO/Clear Liquid Diet    ASSESSMENT:   69 y.o. 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 AVMs burnt and clipped 12/13.    Met with pt in room today. Pt is a poor historian and is hard of hearing so unable to obtain a good history. Pt reports that his appetite is fair today and that he ate some soup. Pt is documented to be eating 25% of his clear liquid meals. RD suspects pt with poor appetite and oral intake at baseline as pt is malnourished and underweight. Per chart, pt is down 16lbs(10%) over the past month; this is significant. RD discussed with pt the importance of adequate protein needed to preserve lean muscle. Pt is willing to drink protein supplements in hospital. Pt advanced to a soft diet after  lunch. RD will add supplements and MVI to help pt meet his estimated needs.    Medications reviewed and include: allopurinol, vitamin C, protonix, carafate, B12  Labs reviewed: Na 133(L), BUN 62(H), creat 2.03(H) Hgb 8.4(L), Hct 26.5(L)  NUTRITION - FOCUSED PHYSICAL EXAM:  Flowsheet Row Most Recent Value  Orbital Region Mild depletion  Upper Arm Region Severe depletion  Thoracic and Lumbar Region Moderate depletion  Buccal Region Moderate depletion  Temple Region Moderate depletion  Clavicle Bone Region Moderate depletion  Clavicle and Acromion Bone Region Moderate depletion  Scapular Bone Region Moderate depletion  Dorsal Hand Severe depletion  Patellar Region Severe depletion  Anterior Thigh Region Severe depletion  Posterior Calf Region Severe depletion  Edema (RD Assessment) None  Hair Reviewed  Eyes Reviewed  Mouth Reviewed  Skin Reviewed  Nails Reviewed     Diet Order:   Diet Order            Diet clear liquid Room service appropriate? Yes; Fluid consistency: Thin  Diet effective now                EDUCATION NEEDS:   Education needs have been addressed  Skin:  Skin Assessment: Reviewed RN Assessment (ecchymosis)  Last BM:  12/14- type 4  Height:   Ht Readings from Last 1 Encounters:  08/02/20 _0  (1.854 m)    Weight:   Wt Readings from Last 1 Encounters:  08/07/20 66.2 kg    Ideal Body Weight:  83.6 kg  BMI:  Body mass index is 19.26 kg/m.  Estimated Nutritional Needs:   Kcal:  2000-2300kcal/day  Protein:  100-115g/day  Fluid:  1.7-2.0L/day  Koleen Distance MS, RD, LDN Please refer to [facility] for RD and/or RD on-call/weekend/after hours pager

## 2020-08-07 NOTE — NC FL2 (Signed)
[place] LEVEL OF CARE SCREENING TOOL     IDENTIFICATION  Patient Name: Carl [name] Birthdate: 02-09-1951 Sex: male Admission Date (Current Location): 08/02/2020  [place] and [place] Number:  Engineering geologist and Address:  [facility], [address] [place], [place] [address]      Provider Number: [id number]  Attending Physician Name and Address:  Darliss Cheney, MD  Relative Name and Phone Number:  Rylie Knierim [phone]    Current Level of Care: Hospital Recommended Level of Care: [place] Prior Approval Number:    Date Approved/Denied:   PASRR Number: [id number]  Discharge Plan: SNF    Current Diagnoses: Patient Active Problem List   Diagnosis Date Noted  . Acute kidney injury superimposed on CKD ([place]) 08/03/2020  . Leukocytosis 08/03/2020  . Acute on chronic blood loss anemia 08/02/2020  . Pressure injury of skin 06/29/2020  . Hyponatremia 06/27/2020  . GI bleed 06/27/2020  . 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 Persistent atrial fibrillation ([place]) 11/30/2019  . Secondary hypercoagulable state ([place]) 11/30/2019  . Arm DVT (deep venous thromboembolism), acute, left ([place]) 11/26/2019  . Hx of adenomatous colonic polyps 05/18/2019  . Cirrhosis ([place]) 05/12/2019  . Occult blood positive stool 05/12/2019  . HCAP (healthcare-associated pneumonia) 03/04/2019  . Hallucinations 02/10/2019  . Elevated blood sugar 01/27/2019  . S/P mitral valve repair 12/31/2018  . Bilateral pulmonary contusion 10/17/2018  . Cardiac LV ejection fraction of 20-34% 10/17/2018  . Chronic systolic CHF (congestive heart failure) ([place]) 10/17/2018  . History of ischemic cardiomyopathy 10/17/2018  . ICD (implantable cardioverter-defibrillator) in place 10/17/2018  . S/p left hip fracture 10/17/2018  . Traumatic retroperitoneal hematoma 10/17/2018  . Age-related osteoporosis with current pathological fracture with routine  healing 07/28/2018  . CAD (coronary artery disease) 07/21/2018  . GERD (gastroesophageal reflux disease) 07/21/2018  . Depression 07/21/2018  . CKD (chronic kidney disease), stage III ([place]) 07/21/2018  . Iron deficiency anemia 07/21/2018  . Hypokalemia 07/21/2018  . Closed left hip fracture ([place]) 07/21/2018  . Fracture of femoral neck, left ([place]) 07/21/2018  . Lower extremity pain, bilateral 11/12/2017  . 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 Hyperlipidemia 09/01/2017  . Essential hypertension 09/01/2017  . PAD (peripheral artery disease) ([place]) 09/01/2017  . Symptomatic anemia 08/06/2017  . GAD (generalized anxiety disorder) 06/18/2017  . Prostate cancer screening 04/28/2017  . Hydrocele 04/28/2017  . Bradycardia   . Premature ventricular contraction   . Severe mitral insufficiency   . Cardiomyopathy, ischemic 04/26/2015  . Difficulty hearing 12/29/2014  . Acute on chronic systolic CHF (congestive heart failure) ([place]) 10/10/2014  . 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 Protein-calorie malnutrition, severe ([place]) 07/29/2014  . AKI (acute kidney injury) ([place]) 07/29/2014    Orientation RESPIRATION BLADDER Height & Weight     Self  Normal Indwelling catheter (to be removed prior to discharge) Weight: 66.2 kg Height:  6\' 1"  (185.4 cm)  BEHAVIORAL SYMPTOMS/MOOD NEUROLOGICAL BOWEL NUTRITION STATUS      Incontinent Diet  AMBULATORY STATUS COMMUNICATION OF NEEDS Skin   Limited Assist Verbally Normal                       Personal Care Assistance Level of Assistance  Bathing,Feeding,Dressing,Total care Bathing Assistance: Limited assistance Feeding assistance: Limited assistance Dressing Assistance: Limited assistance Total Care Assistance: Limited assistance   Functional Limitations Info  Sight,Hearing,Speech Sight Info: Adequate Hearing Info: Impaired (Hard of Hearing)  Speech Info: Impaired (Speech is muffled tone)    SPECIAL CARE FACTORS FREQUENCY  PT  (By licensed PT),OT (By licensed OT)     PT Frequency: 3x week OT Frequency: 3x week            Contractures Contractures Info: Not present    Additional Factors Info  Code Status Code Status Info: Do Not Resuscitate             Current Medications (08/07/2020):  This is the current hospital active medication list Current Facility-Administered Medications  Medication Dose Route Frequency Provider Last Rate Last Admin  . 0.9 %  sodium chloride infusion (Manually program via Guardrails IV Fluids)   Intravenous Once Darliss Cheney, MD      . acetaminophen (TYLENOL) tablet 650 mg  650 mg Oral Q6H PRN Darliss Cheney, MD   650 mg at 08/07/20 1005  . allopurinol (ZYLOPRIM) tablet 100 mg  100 mg Oral Daily Howerter, Justin B, DO   100 mg at 08/07/20 1005  . amiodarone (PACERONE) tablet 200 mg  200 mg Oral Daily Howerter, Justin B, DO   200 mg at 08/07/20 1004  . ascorbic acid (VITAMIN C) tablet 250 mg  250 mg Oral Daily Howerter, Justin B, DO   250 mg at 08/07/20 1004  . atorvastatin (LIPITOR) tablet 40 mg  40 mg Oral QPM Darliss Cheney, MD   40 mg at 08/06/20 2152  . Chlorhexidine Gluconate Cloth 2 % PADS 6 each  6 each Topical Daily Darliss Cheney, MD   6 each at 08/05/20 1213  . DULoxetine (CYMBALTA) DR capsule 30 mg  30 mg Oral QHS Howerter, Justin B, DO   30 mg at 08/06/20 2152  . pantoprazole (PROTONIX) EC tablet 40 mg  40 mg Oral BID Darliss Cheney, MD      . sucralfate (CARAFATE) tablet 1 g  1 g Oral BID Howerter, Justin B, DO   1 g at 08/07/20 1005  . vitamin B-12 (CYANOCOBALAMIN) tablet 500 mcg  500 mcg Oral Daily Howerter, Justin B, DO   500 mcg at 08/07/20 1005     Discharge Medications: Please see discharge summary for a list of discharge medications.  Relevant Imaging Results:  Relevant Lab Results:   Additional Information    Kerin Salen, RN

## 2020-08-07 NOTE — Progress Notes (Signed)
PT Cancellation Note  Patient Details Name: Carl [name] MRN: [id number] DOB: 12-Mar-1951   Cancelled Treatment:     Therapist in to see pt this pm. Pt resting in bed on cardiac monitor.  Pt declined any activity stating he was too fatigued and did not feel up to any activity. Nursing notified.   Josie Dixon 08/07/2020, 1:48 PM

## 2020-08-07 NOTE — Plan of Care (Signed)

## 2020-08-07 NOTE — Progress Notes (Signed)
OT Cancellation Note  Patient Details Name: DREY SHAFF MRN: [id number] DOB: April 01, 1951   Cancelled Treatment:    Reason Eval/Treat Not Completed: Fatigue/lethargy limiting ability to participate. Pt declined, fatigued. Will re-attempt OT tx at later date/time as pt is able to participate and medically appropriate.   Jeni Salles, MPH, MS, OTR/L ascom [phone] 08/07/20, 3:54 PM

## 2020-08-08 ENCOUNTER — Other Ambulatory Visit (HOSPITAL_COMMUNITY): Payer: Medicare Other

## 2020-08-08 DIAGNOSIS — K7460 Unspecified cirrhosis of liver: Secondary | ICD-10-CM

## 2020-08-08 LAB — CBC WITH DIFFERENTIAL/PLATELET
Abs Immature Granulocytes: 0.08 10*3/uL — ABNORMAL HIGH (ref 0.00–0.07)
Basophils Absolute: 0 10*3/uL (ref 0.0–0.1)
Basophils Relative: 0 %
Eosinophils Absolute: 0.2 10*3/uL (ref 0.0–0.5)
Eosinophils Relative: 2 %
HCT: 29 % — ABNORMAL LOW (ref 39.0–52.0)
Hemoglobin: 9.3 g/dL — ABNORMAL LOW (ref 13.0–17.0)
Immature Granulocytes: 1 %
Lymphocytes Relative: 8 %
Lymphs Abs: 0.6 10*3/uL — ABNORMAL LOW (ref 0.7–4.0)
MCH: 31.4 pg (ref 26.0–34.0)
MCHC: 32.1 g/dL (ref 30.0–36.0)
MCV: 98 fL (ref 80.0–100.0)
Monocytes Absolute: 0.9 10*3/uL (ref 0.1–1.0)
Monocytes Relative: 11 %
Neutro Abs: 6.3 10*3/uL (ref 1.7–7.7)
Neutrophils Relative %: 78 %
Platelets: 263 10*3/uL (ref 150–400)
RBC: 2.96 MIL/uL — ABNORMAL LOW (ref 4.22–5.81)
RDW: 25.2 % — ABNORMAL HIGH (ref 11.5–15.5)
Smear Review: NORMAL
WBC: 8.1 10*3/uL (ref 4.0–10.5)
nRBC: 0.5 % — ABNORMAL HIGH (ref 0.0–0.2)

## 2020-08-08 LAB — BASIC METABOLIC PANEL
Anion gap: 10 (ref 5–15)
BUN: 39 mg/dL — ABNORMAL HIGH (ref 8–23)
CO2: 26 mmol/L (ref 22–32)
Calcium: 9.4 mg/dL (ref 8.9–10.3)
Chloride: 97 mmol/L — ABNORMAL LOW (ref 98–111)
Creatinine, Ser: 1.78 mg/dL — ABNORMAL HIGH (ref 0.61–1.24)
GFR, Estimated: 41 mL/min — ABNORMAL LOW (ref >60)
Glucose, Bld: 103 mg/dL — ABNORMAL HIGH (ref 70–99)
Potassium: 3.4 mmol/L — ABNORMAL LOW (ref 3.5–5.1)
Sodium: 133 mmol/L — ABNORMAL LOW (ref 135–145)

## 2020-08-08 LAB — PROTIME-INR
INR: 1.3 — ABNORMAL HIGH (ref 0.8–1.2)
Prothrombin Time: 15.4 seconds — ABNORMAL HIGH (ref 11.4–15.2)

## 2020-08-08 NOTE — Progress Notes (Signed)
Carl [name] NAME: Carl [name]    MR#:  [id number]  DATE OF BIRTH:  1950/10/11  SUBJECTIVE:  patient sitting out in the recliner chair. Very hard to understand his language. Brother [name] in the room who helps me out. No new issues of bleeding per RN. Tolerating PO diet.  REVIEW OF SYSTEMS:   Review of Systems  Unable to perform ROS: Other  = dysrathria Tolerating Diet:yes Tolerating PT: Rehab  DRUG ALLERGIES:   Allergies  Allergen Reactions  . Spironolactone Other (See Comments)    gynecomastia     VITALS:  Blood pressure 113/76, pulse 80, temperature (!) 97.4 F (36.3 C), temperature source Oral, resp. rate (!) 24, height 6\' 1"  (1.854 m), weight 66.2 kg, SpO2 99 %.  PHYSICAL EXAMINATION:   Physical Exam  GENERAL:  69 y.o.-year-old patient lying in the bed with no acute distress.  LUNGS: Normal breath sounds bilaterally, no wheezing, rales, rhonchi. No use of accessory muscles of respiration.  CARDIOVASCULAR: S1, S2 normal. No murmurs, rubs, or gallops.  ABDOMEN: Soft, nontender, nondistended. Bowel sounds present. No organomegaly or mass.  EXTREMITIES: No cyanosis, clubbing or edema b/l.    NEUROLOGIC:  No focal Motor or sensory deficits b/l. Grossly nonfocal  PSYCHIATRIC:  patient is alert and oriented x 3.  SKIN: No obvious rash, lesion, or ulcer.   LABORATORY PANEL:  CBC Recent Labs  Lab 08/08/20 0607  WBC 8.1  HGB 9.3*  HCT 29.0*  PLT 263    Chemistries  Recent Labs  Lab 08/06/20 0637 08/08/20 0607  NA 133* 133*  K 3.8 3.4*  CL 97* 97*  CO2 25 26  GLUCOSE 112* 103*  BUN 62* 39*  CREATININE 2.03* 1.78*  CALCIUM 9.2 9.4  MG 2.2  --   AST 49*  --   ALT 31  --   ALKPHOS 108  --   BILITOT 2.6*  --    Cardiac Enzymes No results for input(s): TROPONINI in the last 168 hours. RADIOLOGY:  No results found. ASSESSMENT AND PLAN:  [name] a 69 y.o.malewith medical history significant  forchronic systolic heart failure with most recent echocardiogram showing LVEF 20% status post AICD placement, paroxysmal atrial fibrillation chronically anticoagulated on Eliquis, stage IV chronic kidney disease with baseline creatinine 2.2-2.7, chronic blood loss anemia with baseline hemoglobin of 7-9, cirrhosis,who presented to ED due to mechanical fall and was admitted to [facility] on 12[facility]9/2021with fall as well as acute on chronic blood loss anemiaafter presenting from home.   Acute on chronic blood loss anemia due to possible upper GI bleed: -Reportedly, patient had EGD as well as colonoscopy recently and both of them were unremarkable.  - He also saw hematology oncology due to persistent anemia and was recommended weekly IV iron.   -Patient yet once again presented with acute on chronic anemia with hemoglobin of 6.8.  He received 2 unit of PRBC transfusion during this hospital stay - Continue on IV Protonix --  Per GI note, capsule endoscopy indicates that he is likely bleeding from AVMs in the stomach and proximal part of the small intestine.Distal part of the small intestine was not evaluated.  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   -His hemoglobin has remained stable since last 4 days.   -Continue to hold Eliquis for now--dr [facility] aware and agrees  Relative hypotension: Patient's blood pressure  has improved now but is still at low normal side.  Holding torsemide   Acute kidney injury on chronic kidney disease stage IIIb:  -Could very well be due to upper GI bleed and dehydration is well as.only slight improvement in creatinine.  Renal function back to baseline. --came in with creat 2.89--1.78   weakness secondary to anemia.  PT OT consulted.  They recommend SNF.  TOC on board--d/w [name]    Leukocytosis: Nonspecific and unremarkable which resolved.  No signs of infection.   Chronic systolic  heart failure: with most recent echocardiogram showing evidence of left ventricular ejection fraction of 20%, status post AICD. -  He is on torsemide 100 mg p.o. daily.  In addition to Coreg.  He appears euvolemic so we will hold torsemide in the setting of low blood pressure and elevated creatinine  Paroxysmal atrial fibrillation: Chronically anticoagulated on Eliquis which is on hold due to GI bleed as above. -  Continue amiodarone and lower dose of carvedilol.  Liver cirrhosis:, non acloholic - Compensated.  Generalized weakness/deconditioning/multiple medical problems: Seen by palliative care.  Not interested in hospice.  He sees outpatient palliative care.  Remains DNR.  Seen by PT OT and they recommend SNF.  TOC consulted.  DVT prophylaxis: SCDs Start: 08/03/20 0049   Code Status: DNR  Family Communication: Brother [name] at bedside today.  Status is: Inpatient   Dispo: The patient is from: Home  Anticipated d/c is to: SNF  Anticipated d/c date BL:TJQZ bed accepted at rehab  Patient currently is medically stable to d/c-- per [facility] brother is having a hard time making decision [facility]. Awaiting final decision.          TOTAL TIME TAKING CARE OF THIS PATIENT: *25* minutes.  >50% time spent on counselling and coordination of care  Note: This dictation was prepared with Dragon dictation along with smaller phrase technology. Any transcriptional errors that result from this process are unintentional.  Fritzi Mandes M.D    [facility]   CC: Primary care physician; [name], MDPatient ID: Carl [name], male   DOB: 1950/10/31, 69 y.o.   MRN: [id number]

## 2020-08-08 NOTE — Progress Notes (Signed)
Mobility Specialist - Progress Note   08/08/20 1400  Mobility  Activity Transferred:  Chair to bed  Level of Assistance Minimal assist, patient does 75% or more  Assistive Device Front wheel walker  Distance Ambulated (ft) 3 ft  Mobility Response Tolerated well  Mobility performed by Mobility specialist  $Mobility charge 1 Mobility    Pt was seated in recliner upon arrival, requesting to return to bed. Noted cath had come off, RN present in room and addressed issue. Pt needed minA to come into upright position. Pt ambulated 3' towards bedside. Pt required vc for safe turning with AD and sequencing. Pt carried over commands well. VC needed for hand placement to sit. Overall, pt tolerated session well. Pt was left in bed with all needs in reach and alarm set. Nurse still present at exit.    Kathee Delton Mobility Specialist 08/08/20, 2:54 PM

## 2020-08-08 NOTE — Progress Notes (Signed)
Pt's penis is swollen and appears to be more swollen than this am page to Dr. Posey Pronto

## 2020-08-08 NOTE — TOC Progression Note (Signed)
Transition of Care [facility]) - Progression Note    Patient Details  Name: Carl [name] MRN: [id number] Date of Birth: 05-09-1951  [facility] of [facility]) CM/SW Contact  Beverly Sessions, RN Phone Number: 08/08/2020, 4:17 PM  Clinical Narrative:     Reviewed bed offers with brother [name] in detail.  He is aware that patient only has 3 SNF days left.  Per Gerald St[facility] at [facility] patient can come for the 3 days at rehab, then transition to copay days.  He has accepted bed at [facility].  Accepted in the [place].  auth started in portal.  Update.  Received call from [place] at [facility] now stating that patient may have to come under LTC.  Brother has reached out to [employer] office directly at [facility] and is awaiting a return call        Expected Discharge Plan and Services                                                 Social Determinants of Health (SDOH) Interventions    Readmission Risk Interventions Readmission Risk Prevention Plan 03/05/2019 12/31/2018  Transportation Screening Complete Complete  PCP or Specialist Appt within 3-5 Days Complete Complete  HRI or [place] Complete Complete  Social Work Consult for [place] Planning/Counseling Complete Complete  Palliative Care Screening Complete Not Applicable  Medication Review Press photographer) Complete Complete  Some recent data might be hidden

## 2020-08-08 NOTE — Progress Notes (Signed)
Physical Therapy Treatment Patient Details Name: Carl [name] MRN: [id number] DOB: 07-28-1951 Today's Date: 08/08/2020    History of Present Illness Carl [name] is a 69 y.o. male with medical history significant for chronic systolic heart failure with most recent echocardiogram showing LVEF 20% status post AICD placement, paroxysmal atrial fibrillation chronically anticoagulated on Eliquis, stage IV chronic kidney disease with baseline creatinine 2.2-2.7, chronic blood loss anemia with baseline hemoglobin of 7-9, cirrhosis, who is admitted to [facility] on 08/02/2020 with acute on chronic blood loss anemia after presenting from home to [facility] Emergency Department for evaluation of ground-level mechanical fall. Pt with x2 PRBC transfusions since admission for low Hgb. Imaging negative for acute changes.    PT Comments    Patient received in bed, he is agreeable to PT session. He requires min assist for supine to sit edge of bed.  Patient has garbled speech and is difficult to understand. He requires min assist for sit to stand from edge of bed. He ambulated a few feet from bed to recliner using walker and min assist. Patient declines ambulating further. He will continue to benefit from skilled PT while here to improve functional independence and safety with mobility.     Follow Up Recommendations  SNF;Supervision for mobility/OOB     Equipment Recommendations  Other (comment) (to be determined)    Recommendations for Other Services       Precautions / Restrictions Precautions Precautions: Fall Restrictions Weight Bearing Restrictions: No    Mobility  Bed Mobility Overal bed mobility: Needs Assistance Bed Mobility: Supine to Sit     Supine to sit: Min assist;HOB elevated     General bed mobility comments: min assist for raising trunk to sitting position  Transfers Overall transfer level: Needs assistance Equipment used: Rolling walker (2  wheeled) Transfers: Sit to/from Stand Sit to Stand: Min assist         General transfer comment: required 2 attempts to get standing. Good standing balance once up  Ambulation/Gait Ambulation/Gait assistance: Min guard Gait Distance (Feet): 5 Feet Assistive device: Rolling walker (2 wheeled) Gait Pattern/deviations: Step-through pattern Gait velocity: decreased   General Gait Details: Min guard for safety for short distance ambulation with RW. Pt demonstrates shuffled gait pattern with decreased foot clearance/step length bil, forward flexed posture, slowed cadence, and downward gaze. Poor safety awareness and decreased RW negotiation.  Declines further ambulation at this time.   Stairs             Wheelchair Mobility    Modified Rankin (Stroke Patients Only)       Balance Overall balance assessment: Needs assistance Sitting-balance support: Feet supported Sitting balance-Leahy Scale: Fair     Standing balance support: Bilateral upper extremity supported;During functional activity Standing balance-Leahy Scale: Good Standing balance comment: Fair standing balance with BUE support in RW. Intermittent posterior lean with poor safety awareness, RW negotiation.                            Cognition Arousal/Alertness: Awake/alert Behavior During Therapy: WFL for tasks assessed/performed Overall Cognitive Status: Difficult to assess                                 General Comments: garbled speech, very difficult to understand      Exercises Total Joint Exercises Ankle Circles/Pumps: AROM;Both;10 reps    General Comments  Pertinent Vitals/Pain      Home Living                      Prior Function            PT Goals (current goals can now be found in the care plan section) Acute Rehab PT Goals Patient Stated Goal: to get better PT Goal Formulation: With patient/family Time For Goal Achievement:  08/19/20 Potential to Achieve Goals: Fair Progress towards PT goals: Progressing toward goals    Frequency    Min 2X/week      PT Plan Current plan remains appropriate    Co-evaluation              AM-PAC PT "6 Clicks" Mobility   Outcome Measure  Help needed turning from your back to your side while in a flat bed without using bedrails?: A Little Help needed moving from lying on your back to sitting on the side of a flat bed without using bedrails?: A Lot Help needed moving to and from a bed to a chair (including a wheelchair)?: A Little Help needed standing up from a chair using your arms (e.g., wheelchair or bedside chair)?: A Little Help needed to walk in hospital room?: A Lot Help needed climbing 3-5 steps with a railing? : Total 6 Click Score: 14    End of Session Equipment Utilized During Treatment: Gait belt Activity Tolerance: Patient tolerated treatment well Patient left: in chair;with call bell/phone within reach;with chair alarm set Nurse Communication: Mobility status PT Visit Diagnosis: Unsteadiness on feet (R26.81);Difficulty in walking, not elsewhere classified (R26.2);Other abnormalities of gait and mobility (R26.89)     Time: 9371-6967 PT Time Calculation (min) (ACUTE ONLY): 13 min  Charges:  $Therapeutic Activity: 8-22 mins                     Kalika Smay, PT, GCS 08/08/20,1:00 PM

## 2020-08-08 NOTE — TOC Progression Note (Signed)
Transition of Care [facility]) - Progression Note    Patient Details  Name: Carl [name] MRN: [id number] Date of Birth: 1950-11-09  Transition of Care [facility]) CM/SW Contact  Shelbie Hutching, RN Phone Number: 08/08/2020, 4:03 PM  Clinical Narrative:    Insurance authorization started through [employer].         Expected Discharge Plan and Services                                                 Social Determinants of Health (SDOH) Interventions    Readmission Risk Interventions Readmission Risk Prevention Plan 03/05/2019 12/31/2018  Transportation Screening Complete Complete  PCP or Specialist Appt within 3-5 Days Complete Complete  HRI or [place] Complete Complete  Social Work Consult for [place] Planning/Counseling Complete Complete  Palliative Care Screening Complete Not Applicable  Medication Review Press photographer) Complete Complete  Some recent data might be hidden

## 2020-08-08 NOTE — Progress Notes (Signed)
Mobility Specialist - Progress Note   08/08/20 1200  Mobility  Activity Transferred:  Bed to chair  Level of Assistance Standby assist, set-up cues, supervision of patient - no hands on  Assistive Device Front wheel walker  Distance Ambulated (ft) 3 ft  Mobility Response Tolerated well  Mobility performed by Mobility specialist  $Mobility charge 1 Mobility    Pre-mobility: 86 HR, 95% SpO2   Pt was supine in bed upon arrival utilizing room air. Pt agreed to ambulate towards recliner, but declined further mobility. PT joined beginning of session. Pt was able to get EOB with supervision. Upon standing, noted pt had incontinent BM. Mobility and NT assisted with peri-care. Pt with large contusion on mid-lower back. Pt denied pain, but would sometimes make grimacing faces during session. Pt was left in recliner with all needs in reach and alarm set. Please refer to PT note for additional information.    Kathee Delton Mobility Specialist 08/08/20, 12:37 PM

## 2020-08-08 NOTE — Progress Notes (Signed)
Daily Progress Note   Patient Name: Carl [name]       Date: 08/08/2020 DOB: [birth year]  Age: 69 y.o. MRN#: [id number] Attending Physician: Fritzi Mandes, MD Primary Care Physician: [name], MD Admit Date: 08/02/2020  Reason for Consultation/Follow-up: Establishing goals of care  Subjective: Patient awake, alert, and sitting up in chair this afternoon. Oriented but minimally engages in conversation with brother at bedside. No pain or discomfort.  GOC: Brother, [name] at bedside. Discussed course of hospitalization including diagnoses, interventions, plan of care. Confirmed plan for SNF rehab and ongoing outpatient palliative follow-up. Also hope for Jermani to return home following rehab.   Patient has clearly told his family of wishes for DNR code status. There is a durable DNR in EMR. Patient does not have a documented living will/POA or MOST form. Reviewed AD packet and MOST form with [name]. He plans to discuss with their sister and Lem prior to completing. Explained that AD packet required notary but MOST form can be completed by any doctor, NP, or PA.  Answered questions. Hard Choices booklet and PMT contact information given.    Length of Stay: 6  Current Medications: Scheduled Meds:  . sodium chloride   Intravenous Once  . allopurinol  100 mg Oral Daily  . amiodarone  200 mg Oral Daily  . ascorbic acid  250 mg Oral Daily  . atorvastatin  40 mg Oral QPM  . Chlorhexidine Gluconate Cloth  6 each Topical Daily  . DULoxetine  30 mg Oral QHS  . feeding supplement  237 mL Oral TID BM  . multivitamin with minerals  1 tablet Oral Daily  . pantoprazole  40 mg Oral BID  . sucralfate  1 g Oral BID  . vitamin B-12  500 mcg Oral Daily    Continuous Infusions:   PRN  Meds: acetaminophen  Physical Exam Vitals and nursing note reviewed.  Constitutional:      General: He is awake.     Appearance: He is ill-appearing.  HENT:     Head: Normocephalic and atraumatic.  Cardiovascular:     Rate and Rhythm: Rhythm irregularly irregular.  Pulmonary:     Effort: No tachypnea, accessory muscle usage or respiratory distress.  Abdominal:     Tenderness: There is no abdominal tenderness.  Skin:    General:  Skin is warm and dry.     Coloration: Skin is pale.  Neurological:     Mental Status: He is alert and oriented to person, place, and time.  Psychiatric:        Mood and Affect: Mood normal.        Speech: Speech is slurred.        Behavior: Behavior normal.            Vital Signs: BP 113/76 (BP Location: Left Arm)   Pulse 80   Temp (!) 97.4 F (36.3 C) (Oral)   Resp (!) 24   Ht 6\' 1"  (1.854 m)   Wt 66.2 kg   SpO2 99%   BMI 19.26 kg/m  SpO2: SpO2: 99 % O2 Device: O2 Device: Room Air O2 Flow Rate: O2 Flow Rate (L/min): 4 L/min  Intake/output summary:   Intake/Output Summary (Last 24 hours) at 08/08/2020 1153 Last data filed at 08/08/2020 1000 Gross per 24 hour  Intake 480 ml  Output 900 ml  Net -420 ml   LBM: Last BM Date: 08/07/20 Baseline Weight: Weight: 72.6 kg Most recent weight: Weight: 66.2 kg       Palliative Assessment/Data: PPS 40%      Patient Active Problem List   Diagnosis Date Noted  . Acute kidney injury superimposed on CKD ([place]) 08/03/2020  . Leukocytosis 08/03/2020  . Acute on chronic blood loss anemia 08/02/2020  . Pressure injury of skin 06/29/2020  . Hyponatremia 06/27/2020  . GI bleed 06/27/2020  . 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 Persistent atrial fibrillation ([place]) 11/30/2019  . Secondary hypercoagulable state ([place]) 11/30/2019  . Arm DVT (deep venous thromboembolism), acute, left ([place]) 11/26/2019  . Hx of adenomatous colonic polyps 05/18/2019  . Cirrhosis ([place]) 05/12/2019  . Occult blood  positive stool 05/12/2019  . HCAP (healthcare-associated pneumonia) 03/04/2019  . Hallucinations 02/10/2019  . Elevated blood sugar 01/27/2019  . S/P mitral valve repair 12/31/2018  . Bilateral pulmonary contusion 10/17/2018  . Cardiac LV ejection fraction of 20-34% 10/17/2018  . Chronic systolic CHF (congestive heart failure) ([place]) 10/17/2018  . History of ischemic cardiomyopathy 10/17/2018  . ICD (implantable cardioverter-defibrillator) in place 10/17/2018  . S/p left hip fracture 10/17/2018  . Traumatic retroperitoneal hematoma 10/17/2018  . Age-related osteoporosis with current pathological fracture with routine healing 07/28/2018  . CAD (coronary artery disease) 07/21/2018  . GERD (gastroesophageal reflux disease) 07/21/2018  . Depression 07/21/2018  . CKD (chronic kidney disease), stage III ([place]) 07/21/2018  . Iron deficiency anemia 07/21/2018  . Hypokalemia 07/21/2018  . Closed left hip fracture ([place]) 07/21/2018  . Fracture of femoral neck, left ([place]) 07/21/2018  . Lower extremity pain, bilateral 11/12/2017  . 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 Hyperlipidemia 09/01/2017  . Essential hypertension 09/01/2017  . PAD (peripheral artery disease) ([place]) 09/01/2017  . Symptomatic anemia 08/06/2017  . GAD (generalized anxiety disorder) 06/18/2017  . Prostate cancer screening 04/28/2017  . Hydrocele 04/28/2017  . Bradycardia   . Premature ventricular contraction   . Severe mitral insufficiency   . Cardiomyopathy, ischemic 04/26/2015  . Difficulty hearing 12/29/2014  . Acute on chronic systolic CHF (congestive heart failure) ([place]) 10/10/2014  . 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 Protein-calorie malnutrition, severe ([place]) 07/29/2014  . AKI (acute kidney injury) ([place]) 07/29/2014    Palliative Care Assessment & Plan   Patient Profile: AYODELE SANGALANG a 69 y.o.malewith medical history significant forchronic systolic heart failure with most recent  echocardiogram  showing LVEF 20% status post AICD placement, paroxysmal atrial fibrillation chronically anticoagulated on Eliquis, stage IV chronic kidney disease with baseline creatinine 2.2-2.7, chronic blood loss anemia with baseline hemoglobin of 7-9, cirrhosis,who is [facility] to [facility] on 12/9/2021with acute on chronic blood loss anemiaafter presenting from home to [facility] Emergency Department for evaluation of ground-level mechanical fall.Noncontrast CT of the head showed no evidence of acute intracranial process, including no evidence of intracranial hemorrhage. CT of the cervical spine showed no evidence of acute fracture or malalignment of the cervical spine. Recent EGD and colonoscopy unremarkable. Palliative medicine consultation for goals of care.   Assessment: Acute on chronic blood loss anemia GI bleeding Chronic systolic CHF with EF 38% Hypotension  AKI on CKD stage IIIb Fall Liver cirrhosis Generalized weakness and deconditioning  Recommendations/Plan: Patient confirms DNR code status. Durable DNR uploaded into EMR. Reviewed AD packet and MOST form with brother, [name] at bedside. [name] plans to review with his sister and [place]. Encouraged them to notify PMT provider if interested in completing documentation prior to discharge. Plan is SNF rehab. Continue outpatient palliative follow-up.  Code Status: DNR   Code Status Orders  (From admission, onward)         Start     Ordered   08/03/20 0050  Do not attempt resuscitation (DNR)  Continuous       Question Answer Comment  In the event of cardiac or respiratory ARREST Do not call a "code blue"   In the event of cardiac or respiratory ARREST Do not perform Intubation, CPR, defibrillation or ACLS   In the event of cardiac or respiratory ARREST Use medication by any route, position, wound care, and other measures to relive pain and suffering. May use oxygen, suction and manual treatment of airway  obstruction as needed for comfort.      08/03/20 0050        Code Status History    Date Active Date Inactive Code Status Order ID Comments User Context   06/27/2020 1452 07/05/2020 1859 DNR [id number]  [name], MD ED   03/04/2019 0243 03/05/2019 1509 Full Code [id number]  Lance Coon, MD ED   02/10/2019 0517 02/10/2019 2006 Full Code [id number]  Antonietta Breach, PA-C ED   12/30/2018 1257 12/31/2018 1732 Full Code [id number]  [name], MD Inpatient   12/21/2018 0912 12/21/2018 1540 Full Code [id number]  [name], MD Inpatient   07/21/2018 1944 07/27/2018 1554 Full Code [id number]  Ivor Costa, MD ED   06/11/2017 0538 06/12/2017 1547 Full Code [id number]  Harrie Foreman, MD Inpatient   06/10/2016 1916 06/12/2016 2340 Full Code [id number]  Maryellen Pile, MD Inpatient   05/01/2015 1514 05/02/2015 1548 Full Code [id number]  Thompson Grayer, MD Inpatient   08/03/2014 1332 08/17/2014 1636 Full Code [id number]  John Giovanni, PA-C Inpatient   07/27/2014 1646 08/03/2014 1332 Full Code [id number]  Erlene Quan, PA-C Inpatient   Advance Care Planning Activity      Prognosis: Poor long-term prognosis  Discharge Planning: [place] for rehab with Palliative care service follow-up  Care plan was discussed with RN, patient, brother [name]), Dr. Posey Pronto  Thank you for allowing the Palliative Medicine Team to assist in the care of this patient.   Total Time 30 Prolonged Time Billed  no    Greater than 50% of this time was spent counseling and coordinating care related to the above assessment and plan.   Ihor Dow, DNP, FNP-C Palliative Medicine Team  Phone: [phone] Fax: [phone]  Please contact Palliative Medicine Team phone at [phone] for questions and concerns.

## 2020-08-09 DIAGNOSIS — K219 Gastro-esophageal reflux disease without esophagitis: Secondary | ICD-10-CM

## 2020-08-09 LAB — CBC WITH DIFFERENTIAL/PLATELET
Abs Immature Granulocytes: 0.05 10*3/uL (ref 0.00–0.07)
Basophils Absolute: 0 10*3/uL (ref 0.0–0.1)
Basophils Relative: 1 %
Eosinophils Absolute: 0.1 10*3/uL (ref 0.0–0.5)
Eosinophils Relative: 2 %
HCT: 28.9 % — ABNORMAL LOW (ref 39.0–52.0)
Hemoglobin: 9.1 g/dL — ABNORMAL LOW (ref 13.0–17.0)
Immature Granulocytes: 1 %
Lymphocytes Relative: 9 %
Lymphs Abs: 0.6 10*3/uL — ABNORMAL LOW (ref 0.7–4.0)
MCH: 31.1 pg (ref 26.0–34.0)
MCHC: 31.5 g/dL (ref 30.0–36.0)
MCV: 98.6 fL (ref 80.0–100.0)
Monocytes Absolute: 0.7 10*3/uL (ref 0.1–1.0)
Monocytes Relative: 10 %
Neutro Abs: 5.6 10*3/uL (ref 1.7–7.7)
Neutrophils Relative %: 77 %
Platelets: 251 10*3/uL (ref 150–400)
RBC: 2.93 MIL/uL — ABNORMAL LOW (ref 4.22–5.81)
RDW: 24.8 % — ABNORMAL HIGH (ref 11.5–15.5)
Smear Review: NORMAL
WBC: 7.1 10*3/uL (ref 4.0–10.5)
nRBC: 0 % (ref 0.0–0.2)

## 2020-08-09 LAB — PROTIME-INR
INR: 1.2 (ref 0.8–1.2)
Prothrombin Time: 14.5 seconds (ref 11.4–15.2)

## 2020-08-09 MED ORDER — CARVEDILOL 3.125 MG PO TABS
3.1250 mg | ORAL_TABLET | Freq: Two times a day (BID) | ORAL | Status: DC
  Administered 2020-08-09 – 2020-08-10 (×2): 3.125 mg via ORAL
  Filled 2020-08-09 (×4): qty 1

## 2020-08-09 NOTE — Progress Notes (Signed)
Occupational Therapy Treatment Patient Details Name: Carl [name] MRN: [id number] DOB: 07-03-1951 Today's Date: 08/09/2020    History of present illness MATTEO BANKE is a 69 y.o. male with medical history significant for chronic systolic heart failure with most recent echocardiogram showing LVEF 20% status post AICD placement, paroxysmal atrial fibrillation chronically anticoagulated on Eliquis, stage IV chronic kidney disease with baseline creatinine 2.2-2.7, chronic blood loss anemia with baseline hemoglobin of 7-9, cirrhosis, who is admitted to [facility] on 08/02/2020 with acute on chronic blood loss anemia after presenting from home to [facility] Emergency Department for evaluation of ground-level mechanical fall. Pt with x2 PRBC transfusions since admission for low Hgb. Imaging negative for acute changes.   OT comments  During today's OT treatment session, [name] presented as extremely lethargic and was largely non-communicative. He did not respond to questions re: pain, but he made frequent grimacing faces that could be indicative of pain. Compared to his Min A requirements in earlier OT sessions, today Mr. Barnhart required Mod-Max A for bed mobility, transfers, and standing balance. He declined to engage in grooming tasks. His sister was present in room and reported that Mr. Horn normally ate well, but that for last several days he has shown almost no interest in food. Today he agreed to drink a bottle of Ensure, taking small sips, with minute-+ breaks required between each sip. Discharge to SNF remains appropriate.    Follow Up Recommendations  SNF    Equipment Recommendations       Recommendations for Other Services      Precautions / Restrictions Precautions Precautions: Fall Precaution Comments: DNR Restrictions Weight Bearing Restrictions: No       Mobility Bed Mobility Overal bed mobility: Needs Assistance Bed Mobility: Supine to Sit     Supine to  sit: Mod assist Sit to supine: Mod assist   General bed mobility comments: Mod-Max A for bed mobility and t/fs  Transfers Overall transfer level: Needs assistance Equipment used: Rolling walker (2 wheeled) Transfers: Sit to/from Stand Sit to Stand: Mod assist              Balance Overall balance assessment: Needs assistance Sitting-balance support: Feet supported;Bilateral upper extremity supported Sitting balance-Leahy Scale: Fair Sitting balance - Comments: Fair balance, intermittently decreasing to poor due to posterior lean, Postural control: Posterior lean Standing balance support: Bilateral upper extremity supported;During functional activity Standing balance-Leahy Scale: Poor                             ADL either performed or assessed with clinical judgement   ADL Overall ADL's : Needs assistance/impaired Eating/Feeding: Set up Eating/Feeding Details (indicate cue type and reason): pt reports no interest in eating or drinking                                 Functional mobility during ADLs: Moderate assistance General ADL Comments: pt very difficult to engage this AM     Vision Patient Visual Report: No change from baseline     Perception     Praxis      Cognition Arousal/Alertness: Lethargic Behavior During Therapy: Flat affect Overall Cognitive Status: Difficult to assess  General Comments: mostly non-verbal today.        Exercises Other Exercises Other Exercises: Self-feeding, bed mobility, t/fs, sitting and standing balance tolerance. DC recs   Shoulder Instructions       General Comments      Pertinent Vitals/ Pain       Pain Assessment: 0-10 Pain Location: pt appears to be in pain (grimacing) but is non-verbal today, unable to give pain score or describe pain Pain Intervention(s): Repositioned;Limited activity within patient's tolerance;RN gave pain meds during  session  Home Living                                          Prior Functioning/Environment              Frequency  Min 1X/week        Progress Toward Goals  OT Goals(current goals can now be found in the care plan section)  Progress towards OT goals: OT to reassess next treatment  Acute Rehab OT Goals Patient Stated Goal: to get better OT Goal Formulation: With patient/family Time For Goal Achievement: 08/19/20 Potential to Achieve Goals: Good  Plan Discharge plan remains appropriate;Frequency remains appropriate    Co-evaluation                 AM-PAC OT "6 Clicks" Daily Activity     Outcome Measure   Help from another person eating meals?: None Help from another person taking care of personal grooming?: A Little Help from another person toileting, which includes using toliet, bedpan, or urinal?: A Lot Help from another person bathing (including washing, rinsing, drying)?: A Lot Help from another person to put on and taking off regular upper body clothing?: A Lot Help from another person to put on and taking off regular lower body clothing?: A Lot 6 Click Score: 15    End of Session Equipment Utilized During Treatment: Rolling walker  OT Visit Diagnosis: Other abnormalities of gait and mobility (R26.89);Muscle weakness (generalized) (M62.81);Unsteadiness on feet (R26.81)   Activity Tolerance Patient limited by lethargy   Patient Left in chair;with call bell/phone within reach;with nursing/sitter in room;with family/visitor present   Nurse Communication Patient requests pain meds;Mobility status        Time: 5009-3818 OT Time Calculation (min): 27 min  Charges: OT General Charges $OT Visit: 1 Visit OT Treatments $Self Care/Home Management : 23-37 mins   Josiah Lobo, PhD, [place], OTR/L ascom [phone] 08/09/20, 12:51 PM

## 2020-08-09 NOTE — TOC Progression Note (Signed)
Transition of Care [facility]) - Progression Note    Patient Details  Name: KEIRON IODICE MRN: [id number] Date of Birth: May 30, 1951  [facility]) CM/SW Contact  Beverly Sessions, RN Phone Number: 08/09/2020, 3:09 PM  Clinical Narrative:      Insurance Josem Kaufmann has been obtained for [facility].    Chris with [facility] confirmed they were going to have to rescind the bed offer.  Gerald Stabs to call patient's brother [name] directly to notify  [facility] contacted [facility], confirmed that they can offer a bed   I have left messages for brother to update awaiting return call  330 pm received return call from brother he has accepted at [facility].  TOC has contacted [facility].  They are working to switch auth from [facility] to [employer].  They will notify TOC when complete.  Brother would like call from MD.  Dr Posey Pronto to call  Anticipated discharge tomorrow  Ricky at [facility] notified  Plan for patient to use his 3 remaining medicare days, then brother will pay copay days.  They are not interested in transition to LTC medicaid at this time.    I have reached out to daughter Barnett Applebaum.  She is in agreement with plan      Expected Discharge Plan and Services                                                 Social Determinants of Health (SDOH) Interventions    Readmission Risk Interventions Readmission Risk Prevention Plan 03/05/2019 12/31/2018  Transportation Screening Complete Complete  PCP or Specialist Appt within 3-5 Days Complete Complete  HRI or [place] Complete Complete  Social Work Consult for [place] Planning/Counseling Complete Complete  Palliative Care Screening Complete Not Applicable  Medication Review Press photographer) Complete Complete  Some recent data might be hidden

## 2020-08-09 NOTE — Progress Notes (Signed)
[facility] [place] NAME: Nayan Proch    MR#:  [id number]  DATE OF BIRTH:  Jul 09, 1951  SUBJECTIVE:  Pt resting. Sleepy this am  No new issues of bleeding per RN. Tolerating PO diet.  REVIEW OF SYSTEMS:   Review of Systems  Unable to perform ROS: Other  = dysrathria Tolerating Diet:yes Tolerating PT: Rehab  DRUG ALLERGIES:   Allergies  Allergen Reactions  . Spironolactone Other (See Comments)    gynecomastia     VITALS:  Blood pressure (!) 100/58, pulse 84, temperature 98.3 F (36.8 C), temperature source Oral, resp. rate 16, height 6\' 1"  (1.854 m), weight 66.2 kg, SpO2 96 %.  PHYSICAL EXAMINATION:   Physical Exam  GENERAL:  69 y.o.-year-old patient lying in the bed with no acute distress.  LUNGS: Normal breath sounds bilaterally, no wheezing, rales, rhonchi. No use of accessory muscles of respiration.  CARDIOVASCULAR: S1, S2 normal. No murmurs, rubs, or gallops.  ABDOMEN: Soft, nontender, nondistended. Bowel sounds present. No organomegaly or mass.  EXTREMITIES: No cyanosis, clubbing or edema b/l.    NEUROLOGIC:  No focal Motor or sensory deficits b/l. Grossly nonfocal  PSYCHIATRIC:  patient is alert and oriented x 3.  SKIN: No obvious rash, lesion, or ulcer.   LABORATORY PANEL:  CBC Recent Labs  Lab 08/09/20 0630  WBC 7.1  HGB 9.1*  HCT 28.9*  PLT 251    Chemistries  Recent Labs  Lab 08/06/20 0637 08/08/20 0607  NA 133* 133*  K 3.8 3.4*  CL 97* 97*  CO2 25 26  GLUCOSE 112* 103*  BUN 62* 39*  CREATININE 2.03* 1.78*  CALCIUM 9.2 9.4  MG 2.2  --   AST 49*  --   ALT 31  --   ALKPHOS 108  --   BILITOT 2.6*  --    Cardiac Enzymes No results for input(s): TROPONINI in the last 168 hours. RADIOLOGY:  No results found. ASSESSMENT AND PLAN:  JOHNNY GORTER a 69 y.o.malewith medical history significant forchronic systolic heart failure with most recent echocardiogram showing LVEF 20% status post AICD  placement, paroxysmal atrial fibrillation chronically anticoagulated on Eliquis, stage IV chronic kidney disease with baseline creatinine 2.2-2.7, chronic blood loss anemia with baseline hemoglobin of 7-9, cirrhosis,who presented to ED due to mechanical fall and was [facility] to [facility]with fall as well as acute on chronic blood loss anemiaafter presenting from home.   Acute on chronic blood loss anemia due to possible upper GI bleed: -Reportedly, patient had EGD as well as colonoscopy recently and both of them were unremarkable.  - He also saw hematology oncology due to persistent anemia and was recommended weekly IV iron as out pt -Patient yet once again presented with acute on chronic anemia with hemoglobin of 6.8.  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   -His hemoglobin has remained stable since last few days. Hgb 9.1 (08/09/2020) -Continue to hold Eliquis for now--dr [facility] aware and agrees  Relative hypotension: Patient's blood pressure has improved now but is still at low normal side.  Holding torsemide   Acute kidney  injury on chronic kidney disease stage IIIb:  -Could very well be due to upper GI bleed and dehydration is well as.only slight improvement in creatinine.  Renal function back to baseline. --came in with creat 2.89--1.78   weakness secondary to anemia.  PT OT consulted.  They recommend SNF.  TOC on board--d/w [name]   Leukocytosis: Nonspecific and unremarkable which resolved.  No signs of infection.   Chronic systolic heart failure: with most recent echocardiogram showing evidence of left ventricular  ejection fraction of 20%, status post AICD. -  He is on torsemide 100 mg p.o. daily.  In addition to Coreg.  He appears euvolemic so we will hold torsemide in the setting of low blood pressure and elevated creatinine  Paroxysmal atrial fibrillation: Chronically anticoagulated on Eliquis which is on hold due to GI bleed as above. -  Continue amiodarone and lower dose of carvedilol.  Liver cirrhosis:, non acloholic - Compensated.  Generalized weakness/deconditioning/multiple medical problems:  -Seen by palliative care.   -Not interested in hospice.  - He sees outpatient palliative care. -  Remains DNR.   -Seen by PT OT and they recommend SNF.  TOC consulted.  DVT prophylaxis: SCDs Start: 08/03/20 0049   Code Status: DNR  Family Communication: Brother [name] at bedside 12/15.  Status is: Inpatient   Dispo: The patient is from: Home  Anticipated d/c is to: SNF  Anticipated d/c date NO:IBBC insurance auth available  Patient currently is medically stable to d/c-- per [facility] awaiting insurance authorization    TOTAL TIME TAKING CARE OF THIS PATIENT: *20* minutes.  >50% time spent on counselling and coordination of care  Note: This dictation was prepared with Dragon dictation along with smaller phrase technology. Any transcriptional errors that result from this process are unintentional.  Fritzi Mandes M.D    [facility]   CC: Primary care physician; [name], MDPatient ID: [name], male   DOB: [birth year], 69 y.o.   MRN: [id number]

## 2020-08-09 NOTE — Progress Notes (Signed)
Mobility Specialist - Progress Note   08/09/20 1350  Mobility  Activity Transferred:  Bed to chair  Level of Assistance Minimal assist, patient does 75% or more  Assistive Device Front wheel walker  Distance Ambulated (ft) 3 ft  Mobility Response Tolerated fair  Mobility performed by Mobility specialist  $Mobility charge 1 Mobility    Pt laying in bed w/ sister and nrsing present in room. Sister states pt had gotten up from chair and nrsing was assisting pt w/ getting back in bed. However, sister requests pt get back up to eat lunch. Pt agreed to session w/ head nod. Pt sister applied hearing aids from home. Pt transitioned from supine to sitting EOB w/ min. Assist for UE support. Pt S2S to RW w/ CGA. Pt transferred from bed to chair w/ min. Assist for guidance and safety. Noted pt has unsteady gait. Overall, pt tolerated session fair. Pt left sitting on recliner w/ alarm set and sister and nursing present in room.      Kinzleigh Kandler Mobility Specialist  08/09/20, 1:57 PM

## 2020-08-10 ENCOUNTER — Other Ambulatory Visit: Payer: Self-pay

## 2020-08-10 DIAGNOSIS — D509 Iron deficiency anemia, unspecified: Secondary | ICD-10-CM

## 2020-08-10 LAB — PROTIME-INR
INR: 1.3 — ABNORMAL HIGH (ref 0.8–1.2)
Prothrombin Time: 15.4 seconds — ABNORMAL HIGH (ref 11.4–15.2)

## 2020-08-10 LAB — SARS CORONAVIRUS 2 BY RT PCR (HOSPITAL ORDER, PERFORMED IN ~~LOC~~ HOSPITAL LAB): SARS Coronavirus 2: NEGATIVE

## 2020-08-10 MED ORDER — ENSURE ENLIVE PO LIQD: 237.0000 mL | Freq: Three times a day (TID) | ORAL | 12 refills | Status: DC

## 2020-08-10 MED ORDER — TORSEMIDE 20 MG PO TABS: 20.0000 mg | ORAL_TABLET | Freq: Every day | ORAL | 0 refills | Status: DC

## 2020-08-10 NOTE — Progress Notes (Signed)
This RN called [place] to give report on the patient to the nurse assuming care. This RN gave report and discharge instructions to [employer]. All outstanding questions resolved. Patient's R Arm PIV removed. Cannula intact. Pt tolerated fairly well. All belongings packed and in tow. Discharge paperwork given to EMS transport in packet. EMS transport procured patient to discharge to [facility].

## 2020-08-10 NOTE — Discharge Summary (Signed)
[name] NAME: [name]    MR#:  [id number]  DATE OF BIRTH:  29-Jun-1951  DATE OF ADMISSION:  08/02/2020 ADMITTING PHYSICIAN: Rhetta Mura, DO  DATE OF DISCHARGE: 08/10/2020  PRIMARY CARE PHYSICIAN: [name], MD    ADMISSION DIAGNOSIS:  Fall, initial encounter [W19.XXXA] Gastrointestinal hemorrhage, unspecified gastrointestinal hemorrhage type [K92.2] Acute on chronic blood loss anemia [D62]  DISCHARGE DIAGNOSIS:  GI bleed suspected due to bleeding AVM in the setting of Anticoagulation  SECONDARY DIAGNOSIS:   Past Medical History:  Diagnosis Date  . AICD (automatic cardioverter/defibrillator) present   . Anemia   . Anxiety   . Arthritis   . Brunner's gland hyperplasia of duodenum   . CHF (congestive heart failure) ([place])   . Chronic kidney disease   . Cirrhosis of liver ([place])   . Coronary artery disease   . Depression   . Dysrhythmia    atrial fib  . External hemorrhoids   . Fracture of femoral neck, left ([place]) 07/21/2018  . GERD (gastroesophageal reflux disease)   . Gout   . Hearing loss   . Heart murmur    mirtal valve insufficiency  . Hepatic failure ([place])   . HH (hiatus hernia)   . Hyperlipidemia   . Hypokalemia   . Hyposmolality   . Intellectual disability    mild  . Internal hemorrhoids   . Ischemic cardiomyopathy   . Leg fracture, left   . Myocardial infarction ([place])   . Osteoporosis   . Paronychia   . Peripheral vascular disease ([place])   . Pneumonia   . Psoriasis   . Psoriasis   . PUD (peptic ulcer disease)   . Schatzki's ring   . Tubular adenoma of colon   . Vitiligo     HOSPITAL COURSE:  Carl [name] a 69 y.o.malewith medical history significant forchronic systolic heart failure with most recent echocardiogram showing LVEF 20% status post AICD placement, paroxysmal atrial fibrillation chronically anticoagulated on Eliquis, stage IV chronic kidney disease with  baseline creatinine 2.2-2.7, chronic blood loss anemia with baseline hemoglobin of 7-9, non-alcoholic cirrhosis,whopresented to ED due to mechanical fall and [facility] as well asacute on chronic blood loss anemiaafter presenting from home.  Acute on chronic blood loss anemia due to possible upper GI bleed:   -Patient yet once again presented with acute on chronic anemia with hemoglobin of 6.8. He received 2 unit of PRBC transfusion during this hospital stay -Continue on IV Protonix--change to oral PPI  +sucralfate --Per GI , capsule endoscopy indicates  likely bleeding from AVMs in the stomach and proximal part of the small intestine. Distal part of the small intestine was not evaluated.  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  -His hemoglobin has remained stable since last few days. Hgb 9.1 (08/09/2020) -Continue to hold Eliquis for now--dr [facility] aware and agrees. --I have emailed [facility] cardiology Dr [name] and Darrick Grinder NP about above  Relative hypotension: Patient's blood pressure has improved nowbut is still at low normal side--asymptomatic. -resume torsemide at 20 mg daily (was on 100 mg at home)  Acute kidney injury on chronic kidney disease stage IIIb:  -Could very well be due to upper GI bleed and dehydration is well as.only slight improvement in creatinine. Renal function back to baseline. --came in with creat 2.89--1.78   weakness secondary to anemia. PT OT  consult appreciated-- recommend SNF.   Chronic systolic heart failure: with most recent echocardiogram showing evidence of left ventricular ejection fraction of 20%, status post AICD. - torsemide resumed at 20 mg with metolazone (as before) with close out pt monitoring of labs -cont low dose  Coreg.  -Euvolemic  Paroxysmal atrial fibrillation: pt was on Eliquis which is on hold due to  recurrent GI bleed as above. - Continue amiodarone and lower dose of carvedilol.  Liver cirrhosis:, non acloholic - Compensated. -on lactulose  Generalized weakness/deconditioning/multiple medical problems:  -Seen by palliative care.  -Not interested in hospice.  -He sees outpatient palliative care. - Remains DNR.  -Seen by PT OT and they recommend SNF.   DVT prophylaxis:SCDs Start: 08/03/20 0049 Code Status: DNR Family Communication:Brother Mike at bedside 12/16 over the phone. Explained to [name] that patient has multiple medical comorbidities and given his recurrent G.I. bleed has caused him to be deconditioned and he will benefit from rehab if he continues to participate. Carl [name] understands patient is at a high risk for readmission given his multiple medical problems including severe cardiomyopathy.  Status is: Inpatient   Dispo: The patient is from: Home Anticipated d/c is to: SNF Anticipated d/c date ZO:XWRUE Patient currently is medically best at baseline  to d/c-- to [facility]   CONSULTS OBTAINED:    DRUG ALLERGIES:   Allergies  Allergen Reactions  . Spironolactone Other (See Comments)    gynecomastia     DISCHARGE MEDICATIONS:   Allergies as of 08/10/2020      Reactions   Spironolactone Other (See Comments)   gynecomastia       Medication List    STOP taking these medications   Eliquis 5 MG Tabs tablet Generic drug: apixaban   gabapentin 300 MG capsule Commonly known as: NEURONTIN     TAKE these medications   acetaminophen 500 MG tablet Commonly known as: TYLENOL Take 500-1,000 mg by mouth every 6 (six) hours as needed (for pain.).   allopurinol 100 MG tablet Commonly known as: ZYLOPRIM Take 100 mg by mouth daily.   amiodarone 200 MG tablet Commonly known as: PACERONE Take 1 tablet (200 mg total) by mouth daily.   atorvastatin 40 MG tablet Commonly known as: LIPITOR Take 40 mg  by mouth every evening.   carvedilol 3.125 MG tablet Commonly known as: COREG Take 1 tablet (3.125 mg total) by mouth 2 (two) times daily.   Cholecalciferol 50 MCG (2000 UT) Tabs Take 2,000 Units by mouth at bedtime.   docusate sodium 100 MG capsule Commonly known as: COLACE Take 100 mg by mouth daily as needed for mild constipation.   DULoxetine 30 MG capsule Commonly known as: CYMBALTA Take 30 mg by mouth at bedtime.   feeding supplement Liqd Take 237 mLs by mouth 3 (three) times daily between meals.   ferrous sulfate 325 (65 FE) MG tablet Take 1 tablet (325 mg total) by mouth daily with breakfast.   ketoconazole 2 % shampoo Commonly known as: NIZORAL Apply 1 application topically 2 (two) times a week. Mon and Thu   lactulose 10 GM/15ML solution Commonly known as: CHRONULAC Take 30 mLs (20 g total) by mouth daily.   metolazone 2.5 MG tablet Commonly known as: ZAROXOLYN Take 1 tablet twice a week, every Monday and Thursday. Take 20 mEq Potassium with Metolazone.   multivitamin tablet Take 1 tablet by mouth at bedtime.   pantoprazole 40 MG tablet Commonly known as: PROTONIX TAKE 1 TABLET BY MOUTH EVERY DAY  potassium chloride SA 20 MEQ tablet Commonly known as: Klor-Con M20 Take 1 tablet (20 mEq total) by mouth daily. What changed:  how much to take when to take this additional instructions   sucralfate 1 g tablet Commonly known as: CARAFATE Take 1 tablet (1 g total) by mouth 2 (two) times daily.   torsemide 20 MG tablet Commonly known as: DEMADEX Take 1 tablet (20 mg total) by mouth daily. What changed:  medication strength how much to take   vitamin B-12 500 MCG tablet Commonly known as: CYANOCOBALAMIN Take 500 mcg by mouth daily.   vitamin C 100 MG tablet Take 100 mg by mouth daily.       If you experience worsening of your admission symptoms, develop shortness of breath, life threatening emergency, suicidal or homicidal thoughts you must  seek medical attention immediately by calling 911 or calling your MD immediately  if symptoms less severe.  You Must read complete instructions/literature along with all the possible adverse reactions/side effects for all the Medicines you take and that have been prescribed to you. Take any new Medicines after you have completely understood and accept all the possible adverse reactions/side effects.   Please note  You were cared for by a hospitalist during your hospital stay. If you have any questions about your discharge medications or the care you received while you were in the hospital after you are discharged, you can call the unit and asked to speak with the hospitalist on call if the hospitalist that took care of you is not available. Once you are discharged, your primary care physician will handle any further medical issues. Please note that NO REFILLS for any discharge medications will be authorized once you are discharged, as it is imperative that you return to your primary care physician (or establish a relationship with a primary care physician if you do not have one) for your aftercare needs so that they can reassess your need for medications and monitor your lab values. Today   SUBJECTIVE   Laying in bed comfortably. Hard on hearing No new issues per RN  VITAL SIGNS:  Blood pressure 103/71, pulse 76, temperature 97.6 F (36.4 C), temperature source Oral, resp. rate 20, height 6\' 1"  (1.854 m), weight 66.2 kg, SpO2 97 %.  I/O:    Intake/Output Summary (Last 24 hours) at 08/10/2020 1030 Last data filed at 08/10/2020 1019 Gross per 24 hour  Intake 0 ml  Output --  Net 0 ml    PHYSICAL EXAMINATION:  GENERAL:  68 y.o.-year-old patient lying in the bed with no acute distress. HOH, dysarthria-chronic LUNGS: Normal breath sounds bilaterally, no wheezing, rales, rhonchi. No use of accessory muscles of respiration.  CARDIOVASCULAR: S1, S2 normal. No murmurs, rubs, or gallops.   ABDOMEN: Soft, nontender, nondistended. Bowel sounds present. No organomegaly or mass.  EXTREMITIES: No cyanosis, clubbing or edema b/l.    NEUROLOGIC:  No focal Motor or sensory deficits b/l. Grossly nonfocal  PSYCHIATRIC:  patient is alert and oriented x 3.  SKIN: No obvious rash, lesion, or ulcer.   DATA REVIEW:   CBC  Recent Labs  Lab 08/09/20 0630  WBC 7.1  HGB 9.1*  HCT 28.9*  PLT 251    Chemistries  Recent Labs  Lab 08/06/20 0637 08/08/20 0607  NA 133* 133*  K 3.8 3.4*  CL 97* 97*  CO2 25 26  GLUCOSE 112* 103*  BUN 62* 39*  CREATININE 2.03* 1.78*  CALCIUM 9.2 9.4  MG 2.2  --  AST 49*  --   ALT 31  --   ALKPHOS 108  --   BILITOT 2.6*  --     Microbiology Results   Recent Results (from the past 240 hour(s))  Resp Panel by RT-PCR (Flu A&B, Covid) Nasopharyngeal Swab     Status: None   Collection Time: 08/02/20 10:54 PM   Specimen: Nasopharyngeal Swab; Nasopharyngeal(NP) swabs in vial transport medium  Result Value Ref Range Status   SARS Coronavirus 2 by RT PCR NEGATIVE NEGATIVE Final    Comment: (NOTE) SARS-CoV-2 target nucleic acids are NOT DETECTED.  The SARS-CoV-2 RNA is generally detectable in upper respiratory specimens during the acute phase of infection. The lowest concentration of SARS-CoV-2 viral copies this assay can detect is 138 copies/mL. A negative result does not preclude SARS-Cov-2 infection and should not be used as the sole basis for treatment or other patient management decisions. A negative result may occur with  improper specimen collection/handling, submission of specimen other than nasopharyngeal swab, presence of viral mutation(s) within the areas targeted by this assay, and inadequate number of viral copies(<138 copies/mL). A negative result must be combined with clinical observations, patient history, and epidemiological information. The expected result is Negative.  Fact Sheet for Patients:   EntrepreneurPulse.com.au  Fact Sheet for Healthcare Providers:  IncredibleEmployment.be  This test is no t yet approved or cleared by the [place] FDA and  has been authorized for detection and/or diagnosis of SARS-CoV-2 by FDA under an Emergency Use Authorization (EUA). This EUA will remain  in effect (meaning this test can be used) for the duration of the COVID-19 declaration under Section 564(b)(1) of the Act, 21 U.S.C.section 360bbb-3(b)(1), unless the authorization is terminated  or revoked sooner.       Influenza A by PCR NEGATIVE NEGATIVE Final   Influenza B by PCR NEGATIVE NEGATIVE Final    Comment: (NOTE) The Xpert Xpress SARS-CoV-2/FLU/RSV plus assay is intended as an aid in the diagnosis of influenza from Nasopharyngeal swab specimens and should not be used as a sole basis for treatment. Nasal washings and aspirates are unacceptable for Xpert Xpress SARS-CoV-2/FLU/RSV testing.  Fact Sheet for Patients: EntrepreneurPulse.com.au  Fact Sheet for Healthcare Providers: IncredibleEmployment.be  This test is not yet approved or cleared by the [place] FDA and has been authorized for detection and/or diagnosis of SARS-CoV-2 by FDA under an Emergency Use Authorization (EUA). This EUA will remain in effect (meaning this test can be used) for the duration of the COVID-19 declaration under Section 564(b)(1) of the Act, 21 U.S.C. section 360bbb-3(b)(1), unless the authorization is terminated or revoked.  Performed at [facility], [address], [place], [place] [address]     RADIOLOGY:  No results found.   CODE STATUS:     Code Status Orders  (From admission, onward)         Start     Ordered   08/03/20 0050  Do not attempt resuscitation (DNR)  Continuous       Question Answer Comment  In the event of cardiac or respiratory ARREST Do not call a "code blue"   In the  event of cardiac or respiratory ARREST Do not perform Intubation, CPR, defibrillation or ACLS   In the event of cardiac or respiratory ARREST Use medication by any route, position, wound care, and other measures to relive pain and suffering. May use oxygen, suction and manual treatment of airway obstruction as needed for comfort.      08/03/20 0050  Code Status History    Date Active Date Inactive Code Status Order ID Comments User Context   06/27/2020 1452 07/05/2020 1859 DNR [id number]  [name], MD ED   03/04/2019 0243 03/05/2019 1509 Full Code [id number]  Lance Coon, MD ED   02/10/2019 0517 02/10/2019 2006 Full Code [id number]  Antonietta Breach, PA-C ED   12/30/2018 1257 12/31/2018 1732 Full Code [id number]  [name], MD Inpatient   12/21/2018 0912 12/21/2018 1540 Full Code [id number]  [name], MD Inpatient   07/21/2018 1944 07/27/2018 1554 Full Code [id number]  Ivor Costa, MD ED   06/11/2017 0538 06/12/2017 1547 Full Code [id number]  Harrie Foreman, MD Inpatient   06/10/2016 1916 06/12/2016 2340 Full Code [id number]  Maryellen Pile, MD Inpatient   05/01/2015 1514 05/02/2015 1548 Full Code [id number]  Thompson Grayer, MD Inpatient   08/03/2014 1332 08/17/2014 1636 Full Code [id number]  John Giovanni, PA-C Inpatient   07/27/2014 1646 08/03/2014 1332 Full Code [id number]  Erlene Quan, PA-C Inpatient   Advance Care Planning Activity       TOTAL TIME TAKING CARE OF THIS PATIENT: **35* minutes.    Fritzi Mandes M.D  [facility]    CC: Primary care physician; [name], MD

## 2020-08-10 NOTE — Plan of Care (Signed)
°  Problem: Education: Goal: Knowledge of General Education information will improve Description: Including pain rating scale, medication(s)/side effects and non-pharmacologic comfort measures 08/10/2020 1205 by Cristela Blue, RN Outcome: Adequate for Discharge 08/10/2020 1205 by Cristela Blue, RN Outcome: Adequate for Discharge   Problem: Health Behavior/Discharge Planning: Goal: Ability to manage health-related needs will improve 08/10/2020 1205 by Cristela Blue, RN Outcome: Adequate for Discharge 08/10/2020 1205 by Cristela Blue, RN Outcome: Adequate for Discharge   Problem: Clinical Measurements: Goal: Ability to maintain clinical measurements within normal limits will improve 08/10/2020 1205 by Cristela Blue, RN Outcome: Adequate for Discharge 08/10/2020 1205 by Cristela Blue, RN Outcome: Adequate for Discharge Goal: Will remain free from infection 08/10/2020 1205 by Cristela Blue, RN Outcome: Adequate for Discharge 08/10/2020 1205 by Cristela Blue, RN Outcome: Adequate for Discharge Goal: Diagnostic test results will improve 08/10/2020 1205 by Cristela Blue, RN Outcome: Adequate for Discharge 08/10/2020 1205 by Cristela Blue, RN Outcome: Adequate for Discharge Goal: Respiratory complications will improve 08/10/2020 1205 by Cristela Blue, RN Outcome: Adequate for Discharge 08/10/2020 1205 by Cristela Blue, RN Outcome: Adequate for Discharge Goal: Cardiovascular complication will be avoided 08/10/2020 1205 by Cristela Blue, RN Outcome: Adequate for Discharge 08/10/2020 1205 by Cristela Blue, RN Outcome: Adequate for Discharge   Problem: Activity: Goal: Risk for activity intolerance will decrease 08/10/2020 1205 by Cristela Blue, RN Outcome: Adequate for Discharge 08/10/2020 1205 by Cristela Blue, RN Outcome: Adequate for Discharge   Problem: Nutrition: Goal: Adequate nutrition will be maintained 08/10/2020 1205 by Cristela Blue, RN Outcome:  Adequate for Discharge 08/10/2020 1205 by Cristela Blue, RN Outcome: Adequate for Discharge   Problem: Coping: Goal: Level of anxiety will decrease 08/10/2020 1205 by Cristela Blue, RN Outcome: Adequate for Discharge 08/10/2020 1205 by Cristela Blue, RN Outcome: Adequate for Discharge   Problem: Elimination: Goal: Will not experience complications related to bowel motility 08/10/2020 1205 by Cristela Blue, RN Outcome: Adequate for Discharge 08/10/2020 1205 by Cristela Blue, RN Outcome: Adequate for Discharge Goal: Will not experience complications related to urinary retention 08/10/2020 1205 by Cristela Blue, RN Outcome: Adequate for Discharge 08/10/2020 1205 by Cristela Blue, RN Outcome: Adequate for Discharge   Problem: Pain Managment: Goal: General experience of comfort will improve 08/10/2020 1205 by Cristela Blue, RN Outcome: Adequate for Discharge 08/10/2020 1205 by Cristela Blue, RN Outcome: Adequate for Discharge   Problem: Safety: Goal: Ability to remain free from injury will improve 08/10/2020 1205 by Cristela Blue, RN Outcome: Adequate for Discharge 08/10/2020 1205 by Cristela Blue, RN Outcome: Adequate for Discharge   Problem: Skin Integrity: Goal: Risk for impaired skin integrity will decrease 08/10/2020 1205 by Cristela Blue, RN Outcome: Adequate for Discharge 08/10/2020 1205 by Cristela Blue, RN Outcome: Adequate for Discharge

## 2020-08-10 NOTE — Care Management Important Message (Signed)
Important Message  Patient Details  Name: Carl [name] MRN: [id number] Date of Birth: 09-Sep-1950   Medicare Important Message Given:  Yes     Dannette Barbara 08/10/2020, 12:07 PM

## 2020-08-11 ENCOUNTER — Emergency Department (HOSPITAL_COMMUNITY)
Admission: EM | Admit: 2020-08-11 | Discharge: 2020-08-12 | Disposition: A | Discharge status: Home / Self Care | Payer: Medicare Other | Attending: Emergency Medicine | Admitting: Emergency Medicine

## 2020-08-11 ENCOUNTER — Other Ambulatory Visit: Payer: Self-pay

## 2020-08-11 DIAGNOSIS — I2510 Atherosclerotic heart disease of native coronary artery without angina pectoris: Secondary | ICD-10-CM | POA: Insufficient documentation

## 2020-08-11 DIAGNOSIS — R0989 Other specified symptoms and signs involving the circulatory and respiratory systems: Secondary | ICD-10-CM | POA: Diagnosis not present

## 2020-08-11 DIAGNOSIS — Z951 Presence of aortocoronary bypass graft: Secondary | ICD-10-CM | POA: Diagnosis not present

## 2020-08-11 DIAGNOSIS — N189 Chronic kidney disease, unspecified: Secondary | ICD-10-CM | POA: Insufficient documentation

## 2020-08-11 DIAGNOSIS — I959 Hypotension, unspecified: Secondary | ICD-10-CM | POA: Insufficient documentation

## 2020-08-11 DIAGNOSIS — Z95810 Presence of automatic (implantable) cardiac defibrillator: Secondary | ICD-10-CM | POA: Diagnosis not present

## 2020-08-11 DIAGNOSIS — I878 Other specified disorders of veins: Secondary | ICD-10-CM | POA: Diagnosis not present

## 2020-08-11 DIAGNOSIS — I509 Heart failure, unspecified: Secondary | ICD-10-CM | POA: Diagnosis not present

## 2020-08-11 DIAGNOSIS — Z87891 Personal history of nicotine dependence: Secondary | ICD-10-CM | POA: Diagnosis not present

## 2020-08-11 LAB — CBC
HCT: 31.3 % — ABNORMAL LOW (ref 39.0–52.0)
Hemoglobin: 10.2 g/dL — ABNORMAL LOW (ref 13.0–17.0)
MCH: 31.8 pg (ref 26.0–34.0)
MCHC: 32.6 g/dL (ref 30.0–36.0)
MCV: 97.5 fL (ref 80.0–100.0)
Platelets: 290 10*3/uL (ref 150–400)
RBC: 3.21 MIL/uL — ABNORMAL LOW (ref 4.22–5.81)
RDW: 23.9 % — ABNORMAL HIGH (ref 11.5–15.5)
WBC: 8.8 10*3/uL (ref 4.0–10.5)
nRBC: 0 % (ref 0.0–0.2)

## 2020-08-11 LAB — BASIC METABOLIC PANEL
Anion gap: 13 (ref 5–15)
BUN: 23 mg/dL (ref 8–23)
CO2: 21 mmol/L — ABNORMAL LOW (ref 22–32)
Calcium: 9.1 mg/dL (ref 8.9–10.3)
Chloride: 97 mmol/L — ABNORMAL LOW (ref 98–111)
Creatinine, Ser: 1.86 mg/dL — ABNORMAL HIGH (ref 0.61–1.24)
GFR, Estimated: 39 mL/min — ABNORMAL LOW (ref >60)
Glucose, Bld: 86 mg/dL (ref 70–99)
Potassium: 4.7 mmol/L (ref 3.5–5.1)
Sodium: 131 mmol/L — ABNORMAL LOW (ref 135–145)

## 2020-08-11 NOTE — ED Triage Notes (Signed)
Pt presents to ED POV. Pt w/ son giving story per son pt told to come to d/t "swelling in neck veins". Pt recently admitted to hosp for "bleeding in neck".

## 2020-08-12 ENCOUNTER — Other Ambulatory Visit: Payer: Self-pay

## 2020-08-12 ENCOUNTER — Emergency Department (HOSPITAL_COMMUNITY): Payer: Medicare Other

## 2020-08-12 DIAGNOSIS — R0989 Other specified symptoms and signs involving the circulatory and respiratory systems: Secondary | ICD-10-CM | POA: Diagnosis not present

## 2020-08-12 NOTE — ED Provider Notes (Signed)
[facility] Emergency Department Provider Note MRN:  [id number]  Arrival date & time: 08/12/20     Chief Complaint   Swollen neck veins History of Present Illness   Carl [name] is a 69 y.o. year-old male with a history of CHF, CKD, cirrhosis presenting to the ED with chief complaint of swollen neck veins.  Patient recently discharged from the hospital.  Had a ground-level fall and was found to be anemic requiring blood transfusion.  Sent to a new care facility yesterday.  Sent here for evaluation as the facility is concerned about patient's swollen neck veins, concerned that patient is not eating.  I was unable to obtain an accurate HPI, PMH, or ROS due to the patient's cognitive impairment.  Level 5 caveat.  Review of Systems  Positive for poor p.o. intake, swollen neck veins.  Patient's Health History    Past Medical History:  Diagnosis Date  . AICD (automatic cardioverter/defibrillator) present   . Anemia   . Anxiety   . Arthritis   . Brunner's gland hyperplasia of duodenum   . CHF (congestive heart failure) ([place])   . Chronic kidney disease   . Cirrhosis of liver ([place])   . Coronary artery disease   . Depression   . Dysrhythmia    atrial fib  . External hemorrhoids   . Fracture of femoral neck, left ([place]) 07/21/2018  . GERD (gastroesophageal reflux disease)   . Gout   . Hearing loss   . Heart murmur    mirtal valve insufficiency  . Hepatic failure ([place])   . HH (hiatus hernia)   . Hyperlipidemia   . Hypokalemia   . Hyposmolality   . Intellectual disability    mild  . Internal hemorrhoids   . Ischemic cardiomyopathy   . Leg fracture, left   . Myocardial infarction ([place])   . Osteoporosis   . Paronychia   . Peripheral vascular disease ([place])   . Pneumonia   . Psoriasis   . Psoriasis   . PUD (peptic ulcer disease)   . Schatzki's ring   . Tubular adenoma of colon   . Vitiligo     Past Surgical History:  Procedure Laterality Date   . ATRIAL SEPTAL DEFECT(ASD) CLOSURE N/A 12/30/2018   Procedure: ATRIAL SEPTAL DEFECT(ASD) CLOSURE;  Surgeon: [name], MD;  Location: [place] [facility];  Service: Cardiovascular;  Laterality: N/A;  . CARDIAC SURGERY     ICD  . CARDIOVERSION N/A 11/09/2019   Procedure: CARDIOVERSION;  Surgeon: [name], MD;  Location: [facility] ENDOSCOPY;  Service: Cardiovascular;  Laterality: N/A;  . CARDIOVERSION N/A 12/21/2019   Procedure: CARDIOVERSION;  Surgeon: [name], MD;  Location: [place];  Service: Cardiovascular;  Laterality: N/A;  . CATARACT EXTRACTION W/PHACO Right 07/26/2020   Procedure: CATARACT EXTRACTION PHACO AND INTRAOCULAR LENS PLACEMENT ([place]) RIGHT VISION BLUE;  Surgeon: Birder Robson, MD;  Location: [facility] ORS;  Service: Ophthalmology;  Laterality: Right;  [place] 01:14.1 CDE 13.65 Fluid Pack Lot # [id number]  . COLONOSCOPY WITH ESOPHAGOGASTRODUODENOSCOPY (EGD)    . COLONOSCOPY WITH PROPOFOL N/A 08/24/2017   Procedure: COLONOSCOPY WITH PROPOFOL;  Surgeon: [name], MD;  Location: [facility] ENDOSCOPY;  Service: Endoscopy;  Laterality: N/A;  . COLONOSCOPY WITH PROPOFOL N/A 07/23/2020   Procedure: COLONOSCOPY WITH PROPOFOL;  Surgeon: Toledo, [name], MD;  Location: [facility] ENDOSCOPY;  Service: Gastroenterology;  Laterality: N/A;  . CORONARY ARTERY BYPASS GRAFT N/A 08/03/2014   Procedure: CORONARY ARTERY BYPASS GRAFTING (CABG);  Surgeon: [name], MD;  Location: [place];  Service: Open Heart Surgery;  Laterality: N/A;  . ENTEROSCOPY N/A 08/06/2020   Procedure: ENTEROSCOPY;  Surgeon: Jonathon Bellows, MD;  Location: [facility] ENDOSCOPY;  Service: Gastroenterology;  Laterality: N/A;  . EP IMPLANTABLE DEVICE N/A 05/01/2015   MDT ICD implanted for primary prevention of sudden death  . ESOPHAGOGASTRODUODENOSCOPY N/A 06/28/2020   Procedure: ESOPHAGOGASTRODUODENOSCOPY (EGD);  Surgeon: Clarene Essex, MD;  Location: [place];  Service: Endoscopy;  Laterality: N/A;  .  ESOPHAGOGASTRODUODENOSCOPY (EGD) WITH PROPOFOL N/A 04/16/2015   Procedure: ESOPHAGOGASTRODUODENOSCOPY (EGD) WITH PROPOFOL;  Surgeon: [name], MD;  Location: [facility] ENDOSCOPY;  Service: Endoscopy;  Laterality: N/A;  . ESOPHAGOGASTRODUODENOSCOPY (EGD) WITH PROPOFOL N/A 08/24/2017   Procedure: ESOPHAGOGASTRODUODENOSCOPY (EGD) WITH PROPOFOL;  Surgeon: [name], MD;  Location: [facility] ENDOSCOPY;  Service: Endoscopy;  Laterality: N/A;  . FRACTURE SURGERY Left    ORIF lower leg  . GIVENS CAPSULE STUDY N/A 08/03/2020   Procedure: GIVENS CAPSULE STUDY;  Surgeon: Jonathon Bellows, MD;  Location: [facility] ENDOSCOPY;  Service: Gastroenterology;  Laterality: N/A;  . HERNIA REPAIR    . HIP PINNING,CANNULATED Left 07/22/2018   Procedure: CANNULATED HIP PINNING;  Surgeon: [name], MD;  Location: [place];  Service: Orthopedics;  Laterality: Left;  . MITRAL VALVE REPAIR N/A 12/30/2018   Procedure: MITRAL VALVE REPAIR;  Surgeon: [name], MD;  Location: [place] CV LAB;  Service: Cardiovascular;  Laterality: N/A;  . RIGHT/LEFT HEART CATH AND CORONARY/GRAFT ANGIOGRAPHY N/A 12/21/2018   Procedure: RIGHT/LEFT HEART CATH AND CORONARY/GRAFT ANGIOGRAPHY;  Surgeon: [name], MD;  Location: [place] [facility];  Service: Cardiovascular;  Laterality: N/A;  . TEE WITHOUT CARDIOVERSION N/A 08/03/2014   Procedure: TRANSESOPHAGEAL ECHOCARDIOGRAM (TEE);  Surgeon: [name], MD;  Location: [place];  Service: Open Heart Surgery;  Laterality: N/A;  . TEE WITHOUT CARDIOVERSION N/A 02/10/2018   Procedure: TRANSESOPHAGEAL ECHOCARDIOGRAM (TEE);  Surgeon: [name], MD;  Location: [facility] ENDOSCOPY;  Service: Cardiovascular;  Laterality: N/A;  . TEE WITHOUT CARDIOVERSION N/A 11/09/2019   Procedure: TRANSESOPHAGEAL ECHOCARDIOGRAM (TEE);  Surgeon: [name], MD;  Location: [facility] ENDOSCOPY;  Service: Cardiovascular;  Laterality: N/A;    Family History  Problem Relation Age of Onset  . Valvular heart  disease Mother        Ruptured valve  . CAD Father   . Heart Problems Brother        Stents x 4  . Diabetes Brother   . Prostate cancer Neg Hx   . Bladder Cancer Neg Hx   . Kidney cancer Neg Hx     Social History   Socioeconomic History  . Marital status: Single    Spouse name: Not on file  . Number of children: Not on file  . Years of education: Not on file  . Highest education level: Not on file  Occupational History  . Not on file  Tobacco Use  . Smoking status: Former Smoker    Quit date: 2014    Years since quitting: 7.9  . Smokeless tobacco: Never Used  . Tobacco comment: 07/30/2017 Quit in 2014  Vaping Use  . Vaping Use: Never used  Substance and Sexual Activity  . Alcohol use: No    Alcohol/week: 0.0 standard drinks  . Drug use: No  . Sexual activity: Never  Other Topics Concern  . Not on file  Social History Narrative   Pt lives in [place] with his mother.   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, television/film/video)   Social Determinants of Health   Financial Resource Strain: Not on file  Food Insecurity: Not on file  Transportation Needs: Not on file  Physical Activity: Not on file  Stress: Not on file  Social Connections: Not on file  Intimate Partner Violence: Not on file     Physical Exam   Vitals:   08/12/20 0529 08/12/20 0600  BP: 109/74 91/67  Pulse: 75 75  Resp: 16 (!) 23  Temp:    SpO2: 96% 97%    CONSTITUTIONAL: Chronically ill-appearing, NAD NEURO: Awake, not oriented, moves all extremities EYES:  eyes equal and reactive ENT/NECK:  no LAD, no JVD CARDIO: Regular rate, well-perfused, normal S1 and S2 PULM:  CTAB no wheezing or rhonchi GI/GU:  normal bowel sounds, non-distended, non-tender MSK/SPINE:  No gross deformities, no edema SKIN:  no rash, atraumatic PSYCH:  Appropriate speech and behavior  *Additional and/or pertinent findings included in MDM below  Diagnostic and Interventional Summary    EKG Interpretation  Date/Time: 08-12-2020  at 07: 01: 59   Ventricular Rate:  77 PR Interval:  183 QRS Duration: 178 QT Interval:  495 QTC Calculation: 561 R Axis:     Text Interpretation: Sinus rhythm, right bundle branch block, single PVC. Confirmed by Dr. Gerlene Fee at 7:11 AM      Labs Reviewed  CBC - Abnormal; Notable for the following components:      Result Value   RBC 3.21 (*)    Hemoglobin 10.2 (*)    HCT 31.3 (*)    RDW 23.9 (*)    All other components within normal limits  BASIC METABOLIC PANEL - Abnormal; Notable for the following components:   Sodium 131 (*)    Chloride 97 (*)    CO2 21 (*)    Creatinine, Ser 1.86 (*)    GFR, Estimated 39 (*)    All other components within normal limits    DG Chest [facility] 1 View  Final Result      Medications - No data to display   Procedures  /  Critical Care Procedures  ED Course and Medical Decision Making  I have reviewed the triage vital signs, the nursing notes, and pertinent available records from the EMR.  Listed above are laboratory and imaging tests that I personally ordered, reviewed, and interpreted and then considered in my medical decision making (see below for details).  Overall patient is in a poor health state, has significant CHF with a very reduced EF.  Seems to be in a overall decline, not eating or drinking well.  Numerous comorbidities.  I suspect that patient's baseline is unknown to the new facility and they were uncomfortable with patient's clinical status.  He does have JVD noted on exam, however he is in no respiratory distress, legs are not particularly swollen.  Vital signs are normal.  No significant changes according to spouse at bedside.  Obtaining screening labs and x-ray, but otherwise patient seems appropriate for continued outpatient management.     Minimal increased edema on chest x-ray, normal oxygen saturations, no increased work of breathing, appropriate for discharge with close cardiology follow-up.  Barth Kirks. [place]onia Small, [name] [email or web address]  Final Clinical Impressions(s) / ED Diagnoses     ICD-10-CM   1. JVD (jugular venous distension)  R09.89     ED Discharge Orders    None       Discharge Instructions Discussed with and Provided  to Patient:     Discharge Instructions     You were evaluated in the Emergency Department and after careful evaluation, we did not find any emergent condition requiring admission or further testing in the hospital.  Your exam/testing today was overall reassuring.  We recommend discussing your Lasix dosing with your cardiologist, you may need to increase your dose over the next few days.  Please return to the Emergency Department if you experience any worsening of your condition.  Thank you for allowing [place] to be a part of your care.        Maudie Flakes, MD 08/12/20 [phone]

## 2020-08-12 NOTE — Discharge Instructions (Addendum)
You were evaluated in the Emergency Department and after careful evaluation, we did not find any emergent condition requiring admission or further testing in the hospital.  Your exam/testing today was overall reassuring.  We recommend discussing your Lasix dosing with your cardiologist, you may need to increase your dose over the next few days.  Please return to the Emergency Department if you experience any worsening of your condition.  Thank you for allowing [place] to be a part of your care.

## 2020-08-13 ENCOUNTER — Other Ambulatory Visit (HOSPITAL_COMMUNITY): Payer: Medicare Other

## 2020-08-13 ENCOUNTER — Encounter (HOSPITAL_COMMUNITY): Payer: Self-pay

## 2020-08-13 NOTE — Progress Notes (Signed)
Was contacted by Esperanza Sheets brother who advised that Robie is now in [employer].  He is not eating and he is worried about him being fluid overloaded.  He wanted to know if he needs to be seen this week at cardiology.  He has appt next week already scheduled.  [facility]ed [facility] and she advised that should be ok to keep appt next week.  He was seen in ED this past weekend.  Julio Sicks and he will bring him to appt next week.    [place] [phone]

## 2020-08-14 ENCOUNTER — Encounter: Payer: Self-pay | Admitting: Gastroenterology

## 2020-08-15 ENCOUNTER — Inpatient Hospital Stay: Payer: No Typology Code available for payment source

## 2020-08-15 VITALS — BP 102/69 | HR 59 | Temp 96.2°F | Resp 20

## 2020-08-15 DIAGNOSIS — D631 Anemia in chronic kidney disease: Secondary | ICD-10-CM | POA: Diagnosis present

## 2020-08-15 DIAGNOSIS — N1831 Chronic kidney disease, stage 3a: Secondary | ICD-10-CM | POA: Diagnosis present

## 2020-08-15 DIAGNOSIS — D509 Iron deficiency anemia, unspecified: Secondary | ICD-10-CM

## 2020-08-15 MED ORDER — IRON SUCROSE 20 MG/ML IV SOLN
200.0000 mg | Freq: Once | INTRAVENOUS | Status: AC
  Administered 2020-08-15: 15:00:00 200 mg via INTRAVENOUS
  Filled 2020-08-15: qty 10

## 2020-08-15 MED ORDER — SODIUM CHLORIDE 0.9 % IV SOLN
Freq: Once | INTRAVENOUS | Status: AC
  Filled 2020-08-15: qty 250

## 2020-08-15 NOTE — Progress Notes (Signed)
Pt tolerated venofer infusion well today with no complaints.  Pt left infusion suite stable in a wheelchair.

## 2020-08-16 ENCOUNTER — Encounter: Payer: Medicare Other | Admitting: Oncology

## 2020-08-16 ENCOUNTER — Other Ambulatory Visit: Payer: Medicare Other

## 2020-08-20 ENCOUNTER — Encounter (HOSPITAL_COMMUNITY): Payer: Self-pay | Admitting: Cardiology

## 2020-08-20 ENCOUNTER — Ambulatory Visit (INDEPENDENT_AMBULATORY_CARE_PROVIDER_SITE_OTHER): Payer: Medicare Other

## 2020-08-20 ENCOUNTER — Ambulatory Visit (HOSPITAL_COMMUNITY)
Admission: RE | Admit: 2020-08-20 | Discharge: 2020-08-20 | Disposition: A | Discharge status: Home / Self Care | Payer: Medicare Other | Source: Ambulatory Visit | Attending: Cardiology | Admitting: Cardiology

## 2020-08-20 ENCOUNTER — Other Ambulatory Visit: Payer: Self-pay

## 2020-08-20 VITALS — BP 98/70 | HR 66 | Wt 139.8 lb

## 2020-08-20 DIAGNOSIS — F32A Depression, unspecified: Secondary | ICD-10-CM | POA: Diagnosis not present

## 2020-08-20 DIAGNOSIS — K7460 Unspecified cirrhosis of liver: Secondary | ICD-10-CM | POA: Insufficient documentation

## 2020-08-20 DIAGNOSIS — Z8249 Family history of ischemic heart disease and other diseases of the circulatory system: Secondary | ICD-10-CM | POA: Insufficient documentation

## 2020-08-20 DIAGNOSIS — N183 Chronic kidney disease, stage 3 unspecified: Secondary | ICD-10-CM | POA: Diagnosis not present

## 2020-08-20 DIAGNOSIS — Z95810 Presence of automatic (implantable) cardiac defibrillator: Secondary | ICD-10-CM

## 2020-08-20 DIAGNOSIS — J90 Pleural effusion, not elsewhere classified: Secondary | ICD-10-CM | POA: Diagnosis not present

## 2020-08-20 DIAGNOSIS — Z87891 Personal history of nicotine dependence: Secondary | ICD-10-CM | POA: Insufficient documentation

## 2020-08-20 DIAGNOSIS — Z951 Presence of aortocoronary bypass graft: Secondary | ICD-10-CM | POA: Insufficient documentation

## 2020-08-20 DIAGNOSIS — I2510 Atherosclerotic heart disease of native coronary artery without angina pectoris: Secondary | ICD-10-CM | POA: Insufficient documentation

## 2020-08-20 DIAGNOSIS — I5022 Chronic systolic (congestive) heart failure: Secondary | ICD-10-CM | POA: Diagnosis present

## 2020-08-20 DIAGNOSIS — F70 Mild intellectual disabilities: Secondary | ICD-10-CM | POA: Insufficient documentation

## 2020-08-20 DIAGNOSIS — I480 Paroxysmal atrial fibrillation: Secondary | ICD-10-CM | POA: Insufficient documentation

## 2020-08-20 DIAGNOSIS — Z833 Family history of diabetes mellitus: Secondary | ICD-10-CM | POA: Insufficient documentation

## 2020-08-20 DIAGNOSIS — Z79899 Other long term (current) drug therapy: Secondary | ICD-10-CM | POA: Diagnosis not present

## 2020-08-20 DIAGNOSIS — I255 Ischemic cardiomyopathy: Secondary | ICD-10-CM | POA: Insufficient documentation

## 2020-08-20 DIAGNOSIS — I4510 Unspecified right bundle-branch block: Secondary | ICD-10-CM | POA: Insufficient documentation

## 2020-08-20 DIAGNOSIS — Z7901 Long term (current) use of anticoagulants: Secondary | ICD-10-CM | POA: Diagnosis not present

## 2020-08-20 LAB — COMPREHENSIVE METABOLIC PANEL
ALT: 18 U/L (ref 0–44)
AST: 31 U/L (ref 15–41)
Albumin: 3 g/dL — ABNORMAL LOW (ref 3.5–5.0)
Alkaline Phosphatase: 202 U/L — ABNORMAL HIGH (ref 38–126)
Anion gap: 11 (ref 5–15)
BUN: 24 mg/dL — ABNORMAL HIGH (ref 8–23)
CO2: 26 mmol/L (ref 22–32)
Calcium: 9 mg/dL (ref 8.9–10.3)
Chloride: 96 mmol/L — ABNORMAL LOW (ref 98–111)
Creatinine, Ser: 2.15 mg/dL — ABNORMAL HIGH (ref 0.61–1.24)
GFR, Estimated: 33 mL/min — ABNORMAL LOW (ref >60)
Glucose, Bld: 93 mg/dL (ref 70–99)
Potassium: 3.9 mmol/L (ref 3.5–5.1)
Sodium: 133 mmol/L — ABNORMAL LOW (ref 135–145)
Total Bilirubin: 1.2 mg/dL (ref 0.3–1.2)
Total Protein: 6.7 g/dL (ref 6.5–8.1)

## 2020-08-20 LAB — CBC
HCT: 34 % — ABNORMAL LOW (ref 39.0–52.0)
Hemoglobin: 10.8 g/dL — ABNORMAL LOW (ref 13.0–17.0)
MCH: 30.5 pg (ref 26.0–34.0)
MCHC: 31.8 g/dL (ref 30.0–36.0)
MCV: 96 fL (ref 80.0–100.0)
Platelets: 411 10*3/uL — ABNORMAL HIGH (ref 150–400)
RBC: 3.54 MIL/uL — ABNORMAL LOW (ref 4.22–5.81)
RDW: 20.8 % — ABNORMAL HIGH (ref 11.5–15.5)
WBC: 5.6 10*3/uL (ref 4.0–10.5)
nRBC: 0 % (ref 0.0–0.2)

## 2020-08-20 LAB — TSH: TSH: 2.459 u[IU]/mL (ref 0.350–4.500)

## 2020-08-20 MED ORDER — APIXABAN 2.5 MG PO TABS: 2.5000 mg | ORAL_TABLET | Freq: Two times a day (BID) | ORAL | 0 refills | Status: DC

## 2020-08-20 MED ORDER — TORSEMIDE 20 MG PO TABS: 60.0000 mg | ORAL_TABLET | Freq: Every day | ORAL | 0 refills | Status: DC

## 2020-08-20 NOTE — Patient Instructions (Addendum)
Orders sent to Facility:   START Eliquis 2.5mg  (1 tablet) twice daily   INCREASE Torsemide 60mg  Daily   Labs done today, we will contact you for abnormal readings.   EKG done today   Your physician recommends that you schedule a follow-up appointment in: 1-2 weeks  If you have any questions or concerns before your next appointment please send a message through [place] or call our office at [phone].    TO LEAVE A MESSAGE FOR THE NURSE SELECT OPTION 2, PLEASE LEAVE A MESSAGE INCLUDING: . YOUR NAME . DATE OF BIRTH . CALL BACK NUMBER . REASON FOR CALL**this is important as we prioritize the call backs  YOU WILL RECEIVE A CALL BACK THE SAME DAY AS LONG AS YOU CALL BEFORE 4:00 PM
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
## 2020-08-20 NOTE — Progress Notes (Signed)
EPIC Encounter for ICM Monitoring  Patient Name: Carl [name] is a 69 y.o. male Date: 08/20/2020 Primary Care Physican: Barbette Reichmann, MD Primary Cardiologist:[place] Electrophysiologist: Allred 12/1/2021Weight: 164lbs  Time in AF0.0hr/day (0.0%)  Patient seen by Dr Shirlee Latch today, 08/20/2020.  2 hospitalizations for December  OptivolThoracic impedancesuggesting possible fluid accumulation since 05/21/2020.  Prescribed:  Torsemide20mg take4 tablets(80mg  total)by mouthdaily.  Metolazone 2.5 mgTake 1 tablet (2.5 mg total) by mouth twice a week on Monday and Thursday. Take 20 mEq Potassium with Metolazone.  Potassium 20 mEqTake 1 tablet (20 mEq total)by mouth daily. Also take extra 20 mEq on Metolazone days.  Labs: 08/11/2020 Creatinine 1.86, BUN 23, Potassium 4.7, Sodium 131, GFR 39 08/08/2020 Creatinine 1.78, BUN 39, Potassium 3.4, Sodium 133, GFR 41  08/06/2020 Creatinine 2.03, BUN 62, Potassium 3.8, Sodium 133, GFR 35  08/05/2020 Creatinine 2.42, BUN 78, Potassium 4.3, Sodium 130, GFR 28  08/04/2020 Creatinine 2.89, BUN 88, Potassium 2.6, Sodium 133, GFR 23  08/03/2020 Creatinine 2.97, BUN 89, Potassium 2.9, Sodium 130, GFR 22  08/02/2020 Creatinine 2.91, BUN 82, Potassium 3.0, Sodium 131, GFR 23 (9:16 PM) 08/02/2020 Creatinine 2.91, BUN 72, Potassium 2.6, Sodium 130, GFR 23 (2:24 PM)  A complete set of results can be found in Results Review  Recommendations:Recommendations will be given at 08/20/2020 HF clinic OV.  Follow-up plan: ICM clinic phone appointment on1/07/2021 91 day device clinic remote transmission3/10/2020.  EP/Cardiology Office Visits: 08/20/2020 with Dr Shirlee Latch.  Copy of ICM check sent to Dr.Allred.   3 month ICM trend: 08/20/2020    1 Year ICM trend:       Karie Soda, RN 08/20/2020 12:44 PM

## 2020-08-20 NOTE — Progress Notes (Signed)
ICM remote transmission rescheduled to 09/04/2019 since patient will be seen by Dr [name] today, 08/20/2020.

## 2020-08-24 ENCOUNTER — Other Ambulatory Visit (HOSPITAL_COMMUNITY): Payer: Self-pay | Admitting: Cardiology

## 2020-08-27 ENCOUNTER — Encounter (HOSPITAL_COMMUNITY): Payer: Self-pay | Admitting: Cardiology

## 2020-08-27 ENCOUNTER — Other Ambulatory Visit: Payer: Self-pay

## 2020-08-27 ENCOUNTER — Encounter (HOSPITAL_COMMUNITY): Payer: Self-pay

## 2020-08-27 ENCOUNTER — Ambulatory Visit (HOSPITAL_COMMUNITY)
Admission: RE | Admit: 2020-08-27 | Discharge: 2020-08-27 | Disposition: A | Discharge status: Home / Self Care | Payer: Medicare Other | Source: Ambulatory Visit | Attending: Cardiology | Admitting: Cardiology

## 2020-08-27 VITALS — BP 98/72 | HR 65 | Wt 130.2 lb

## 2020-08-27 DIAGNOSIS — R7989 Other specified abnormal findings of blood chemistry: Secondary | ICD-10-CM | POA: Diagnosis not present

## 2020-08-27 DIAGNOSIS — I340 Nonrheumatic mitral (valve) insufficiency: Secondary | ICD-10-CM

## 2020-08-27 DIAGNOSIS — I2510 Atherosclerotic heart disease of native coronary artery without angina pectoris: Secondary | ICD-10-CM | POA: Diagnosis not present

## 2020-08-27 DIAGNOSIS — Z87448 Personal history of other diseases of urinary system: Secondary | ICD-10-CM | POA: Insufficient documentation

## 2020-08-27 DIAGNOSIS — N183 Chronic kidney disease, stage 3 unspecified: Secondary | ICD-10-CM | POA: Insufficient documentation

## 2020-08-27 DIAGNOSIS — I4510 Unspecified right bundle-branch block: Secondary | ICD-10-CM | POA: Diagnosis not present

## 2020-08-27 DIAGNOSIS — Z79899 Other long term (current) drug therapy: Secondary | ICD-10-CM | POA: Diagnosis not present

## 2020-08-27 DIAGNOSIS — K7460 Unspecified cirrhosis of liver: Secondary | ICD-10-CM

## 2020-08-27 DIAGNOSIS — F32A Depression, unspecified: Secondary | ICD-10-CM | POA: Diagnosis not present

## 2020-08-27 DIAGNOSIS — L80 Vitiligo: Secondary | ICD-10-CM | POA: Insufficient documentation

## 2020-08-27 DIAGNOSIS — F70 Mild intellectual disabilities: Secondary | ICD-10-CM | POA: Diagnosis not present

## 2020-08-27 DIAGNOSIS — I493 Ventricular premature depolarization: Secondary | ICD-10-CM

## 2020-08-27 DIAGNOSIS — K2930 Chronic superficial gastritis without bleeding: Secondary | ICD-10-CM | POA: Diagnosis not present

## 2020-08-27 DIAGNOSIS — I480 Paroxysmal atrial fibrillation: Secondary | ICD-10-CM | POA: Diagnosis not present

## 2020-08-27 DIAGNOSIS — I5022 Chronic systolic (congestive) heart failure: Secondary | ICD-10-CM | POA: Diagnosis present

## 2020-08-27 DIAGNOSIS — N182 Chronic kidney disease, stage 2 (mild): Secondary | ICD-10-CM

## 2020-08-27 DIAGNOSIS — Z951 Presence of aortocoronary bypass graft: Secondary | ICD-10-CM | POA: Insufficient documentation

## 2020-08-27 DIAGNOSIS — I255 Ischemic cardiomyopathy: Secondary | ICD-10-CM | POA: Diagnosis not present

## 2020-08-27 DIAGNOSIS — J90 Pleural effusion, not elsewhere classified: Secondary | ICD-10-CM

## 2020-08-27 DIAGNOSIS — D631 Anemia in chronic kidney disease: Secondary | ICD-10-CM | POA: Diagnosis not present

## 2020-08-27 DIAGNOSIS — Z7901 Long term (current) use of anticoagulants: Secondary | ICD-10-CM | POA: Diagnosis not present

## 2020-08-27 DIAGNOSIS — Z993 Dependence on wheelchair: Secondary | ICD-10-CM | POA: Insufficient documentation

## 2020-08-27 DIAGNOSIS — Z96642 Presence of left artificial hip joint: Secondary | ICD-10-CM | POA: Insufficient documentation

## 2020-08-27 DIAGNOSIS — D649 Anemia, unspecified: Secondary | ICD-10-CM

## 2020-08-27 DIAGNOSIS — R5381 Other malaise: Secondary | ICD-10-CM

## 2020-08-27 LAB — BASIC METABOLIC PANEL
Anion gap: 13 (ref 5–15)
BUN: 55 mg/dL — ABNORMAL HIGH (ref 8–23)
CO2: 31 mmol/L (ref 22–32)
Calcium: 9.6 mg/dL (ref 8.9–10.3)
Chloride: 88 mmol/L — ABNORMAL LOW (ref 98–111)
Creatinine, Ser: 2.45 mg/dL — ABNORMAL HIGH (ref 0.61–1.24)
GFR, Estimated: 28 mL/min — ABNORMAL LOW (ref >60)
Glucose, Bld: 113 mg/dL — ABNORMAL HIGH (ref 70–99)
Potassium: 3.8 mmol/L (ref 3.5–5.1)
Sodium: 132 mmol/L — ABNORMAL LOW (ref 135–145)

## 2020-08-27 MED ORDER — ENSURE COMPLETE SHAKE PO LIQD: 1.0000 | Freq: Three times a day (TID) | ORAL | 11 refills | Status: AC

## 2020-08-27 NOTE — Patient Instructions (Signed)
Start ensure, one can three times a day between meals  Labs today We will only contact you if something comes back abnormal or we need to make some changes. Otherwise no news is good news!  Your physician recommends that you schedule a follow-up appointment in: 4-6 weeks  If you have any questions or concerns before your next appointment please send [place] a message through [place] or call our office at [phone].    TO LEAVE A MESSAGE FOR THE NURSE SELECT OPTION 2, PLEASE LEAVE A MESSAGE INCLUDING: . YOUR NAME . DATE OF BIRTH . CALL BACK NUMBER . REASON FOR CALL**this is important as we prioritize the call backs  YOU WILL RECEIVE A CALL BACK THE SAME DAY AS LONG AS YOU CALL BEFORE 4:00 PM
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
## 2020-09-03 ENCOUNTER — Other Ambulatory Visit: Payer: Self-pay

## 2020-09-03 ENCOUNTER — Inpatient Hospital Stay: Payer: Medicare Other | Attending: Oncology

## 2020-09-03 DIAGNOSIS — N1831 Chronic kidney disease, stage 3a: Secondary | ICD-10-CM | POA: Diagnosis present

## 2020-09-03 DIAGNOSIS — D631 Anemia in chronic kidney disease: Secondary | ICD-10-CM | POA: Insufficient documentation

## 2020-09-03 DIAGNOSIS — K7460 Unspecified cirrhosis of liver: Secondary | ICD-10-CM | POA: Diagnosis not present

## 2020-09-03 DIAGNOSIS — D509 Iron deficiency anemia, unspecified: Secondary | ICD-10-CM

## 2020-09-03 DIAGNOSIS — K7469 Other cirrhosis of liver: Secondary | ICD-10-CM

## 2020-09-03 LAB — RETIC PANEL
Immature Retic Fract: 32.6 % — ABNORMAL HIGH (ref 2.3–15.9)
RBC.: 2.72 MIL/uL — ABNORMAL LOW (ref 4.22–5.81)
Retic Count, Absolute: 103.6 10*3/uL (ref 19.0–186.0)
Retic Ct Pct: 3.8 % — ABNORMAL HIGH (ref 0.4–3.1)
Reticulocyte Hemoglobin: 32.8 pg (ref >27.9)

## 2020-09-03 LAB — CBC WITH DIFFERENTIAL/PLATELET
Abs Immature Granulocytes: 0.07 10*3/uL (ref 0.00–0.07)
Basophils Absolute: 0.1 10*3/uL (ref 0.0–0.1)
Basophils Relative: 1 %
Eosinophils Absolute: 0.1 10*3/uL (ref 0.0–0.5)
Eosinophils Relative: 2 %
HCT: 27.1 % — ABNORMAL LOW (ref 39.0–52.0)
Hemoglobin: 8.9 g/dL — ABNORMAL LOW (ref 13.0–17.0)
Immature Granulocytes: 1 %
Lymphocytes Relative: 10 %
Lymphs Abs: 0.6 10*3/uL — ABNORMAL LOW (ref 0.7–4.0)
MCH: 31.9 pg (ref 26.0–34.0)
MCHC: 32.8 g/dL (ref 30.0–36.0)
MCV: 97.1 fL (ref 80.0–100.0)
Monocytes Absolute: 0.6 10*3/uL (ref 0.1–1.0)
Monocytes Relative: 10 %
Neutro Abs: 4.4 10*3/uL (ref 1.7–7.7)
Neutrophils Relative %: 76 %
Platelets: 351 10*3/uL (ref 150–400)
RBC: 2.79 MIL/uL — ABNORMAL LOW (ref 4.22–5.81)
RDW: 20.9 % — ABNORMAL HIGH (ref 11.5–15.5)
WBC: 5.8 10*3/uL (ref 4.0–10.5)
nRBC: 0 % (ref 0.0–0.2)

## 2020-09-03 LAB — FOLATE: Folate: 21.6 ng/mL (ref >5.9)

## 2020-09-03 LAB — VITAMIN B12: Vitamin B-12: 1362 pg/mL — ABNORMAL HIGH (ref 180–914)

## 2020-09-03 LAB — IRON AND TIBC
Iron: 51 ug/dL (ref 45–182)
Saturation Ratios: 12 % — ABNORMAL LOW (ref 17.9–39.5)
TIBC: 412 ug/dL (ref 250–450)
UIBC: 361 ug/dL

## 2020-09-03 LAB — SAMPLE TO BLOOD BANK

## 2020-09-03 LAB — FERRITIN: Ferritin: 65 ng/mL (ref 24–336)

## 2020-09-05 ENCOUNTER — Inpatient Hospital Stay: Payer: Medicare Other

## 2020-09-05 ENCOUNTER — Other Ambulatory Visit: Payer: Self-pay

## 2020-09-05 ENCOUNTER — Encounter: Payer: Self-pay | Admitting: Oncology

## 2020-09-05 ENCOUNTER — Inpatient Hospital Stay (HOSPITAL_BASED_OUTPATIENT_CLINIC_OR_DEPARTMENT_OTHER): Payer: Medicare Other | Admitting: Oncology

## 2020-09-05 ENCOUNTER — Ambulatory Visit (INDEPENDENT_AMBULATORY_CARE_PROVIDER_SITE_OTHER): Payer: Medicare Other

## 2020-09-05 VITALS — BP 101/63 | HR 61 | Resp 20

## 2020-09-05 VITALS — BP 88/62 | HR 67 | Temp 97.5°F | Resp 16 | Wt 141.6 lb

## 2020-09-05 DIAGNOSIS — N1831 Chronic kidney disease, stage 3a: Secondary | ICD-10-CM | POA: Diagnosis not present

## 2020-09-05 DIAGNOSIS — I5022 Chronic systolic (congestive) heart failure: Secondary | ICD-10-CM

## 2020-09-05 DIAGNOSIS — Z95810 Presence of automatic (implantable) cardiac defibrillator: Secondary | ICD-10-CM

## 2020-09-05 DIAGNOSIS — D631 Anemia in chronic kidney disease: Secondary | ICD-10-CM | POA: Diagnosis not present

## 2020-09-05 DIAGNOSIS — D500 Iron deficiency anemia secondary to blood loss (chronic): Secondary | ICD-10-CM | POA: Diagnosis not present

## 2020-09-05 DIAGNOSIS — D509 Iron deficiency anemia, unspecified: Secondary | ICD-10-CM

## 2020-09-05 DIAGNOSIS — K7469 Other cirrhosis of liver: Secondary | ICD-10-CM

## 2020-09-05 MED ORDER — IRON SUCROSE 20 MG/ML IV SOLN
200.0000 mg | Freq: Once | INTRAVENOUS | Status: AC
  Administered 2020-09-05: 200 mg via INTRAVENOUS
  Filled 2020-09-05: qty 10

## 2020-09-05 MED ORDER — SODIUM CHLORIDE 0.9 % IV SOLN
Freq: Once | INTRAVENOUS | Status: AC
  Filled 2020-09-05: qty 250

## 2020-09-05 NOTE — Progress Notes (Signed)
1600- Patient tolerated Venofer infusion well. Patient and vital signs stable. Patient discharged at this time.

## 2020-09-05 NOTE — Progress Notes (Signed)
Hematology/Oncology follow up  note [facility] [phone] [phone]   Patient Care Team: [name], MD as PCP - General (Internal Medicine)  REFERRING PROVIDER: [name], MD  CHIEF COMPLAINTS/REASON FOR VISIT:  Follow-up for anemia  HISTORY OF PRESENTING ILLNESS:  Carl [name] is a  70 y.o.  male with PMH listed below who was referred to me for evaluation of anemia Reviewed patient's recent labs that was done.  05/25/2019 Labs revealed anemia with hemoglobin of 8.8 Reviewed patient's previous labs  anemia is chronic onset , duration is since 2016 No aggravating or improving factors.  Associated signs and symptoms: Patient reports fatigue. denies SOB with exertion. He is a poor historian Denies weight loss, easy bruising, hematochezia, hemoptysis, hematuria. Context: History of GI bleeding: denies               History of Chronic kidney disease                               Last colonoscopy: 2018 colonoscopy showed one small polyp in the sigmoid colon, removed with a hot snare. Resected and retrieved. bleeding colonic angioectasia. Treated with argon plasma coagulation (APC). -EGD 2018 showed esophagitis.   Lives by himself. Family sister/brother bring him food.  #  CHF, LVEF 20 to 25%, history of MV repair in May 2020.  On Plavix.  And aspirin.  # 07/23/2020 colonoscopy showed preparation of the colon was inadequate.  Nonbleeding internal hemorrhoids.  Tuberculosis of the entire examined colon.  5 mm polyp in the cecum resected and retrieved.  8 mm polyp in the transverse colon resected tissue not retrieved.  Incomplete resection. 06/28/2020, upper endoscopy showed small hiatal hernia, portal hypertensive gastropathy.   INTERVAL HISTORY Carl [name] is a 70 y.o. male who has above history reviewed by me today presents for follow up visit for management of Anemia Patient is a poor historian due to chronic cognitive  impairment. He is here by himself today. He is not able to provide any history. Reviewed medical records Patient was admitted in December 2021 due to GI bleeding. For. Bleeding was considered to be secondary to the bleeding AVM in the setting of anticoagulation. Patient was seen by gastroenterology during admission. Had capsule study done which indicates likely bleeding from AVM in the stomach and the proximal part of the small intestine. Distal part of the small intestine was not evaluated. 08/06/2020 enteroscopy showed 4 nonbleeding AVMs in duodenum treated with APC. At discharge, hemoglobin was 9.1. 08/20/2020, hemoglobin 10.8.   Review of Systems  Unable to perform ROS: Patient nonverbal  Constitutional: Positive for fatigue.     MEDICAL HISTORY:  Past Medical History:  Diagnosis Date  . AICD (automatic cardioverter/defibrillator) present   . Anemia   . Anxiety   . Arthritis   . Brunner's gland hyperplasia of duodenum   . CHF (congestive heart failure) ([place])   . Chronic kidney disease   . Cirrhosis of liver ([place])   . Coronary artery disease   . Depression   . Dysrhythmia    atrial fib  . External hemorrhoids   . Fracture of femoral neck, left ([place]) 07/21/2018  . GERD (gastroesophageal reflux disease)   . Gout   . Hearing loss   . Heart murmur    mirtal valve insufficiency  . Hepatic failure ([place])   . HH (hiatus hernia)   . Hyperlipidemia   .  Hypokalemia   . Hyposmolality   . Intellectual disability    mild  . Internal hemorrhoids   . Ischemic cardiomyopathy   . Leg fracture, left   . Myocardial infarction ([place])   . Osteoporosis   . Paronychia   . Peripheral vascular disease ([place])   . Pneumonia   . Psoriasis   . Psoriasis   . PUD (peptic ulcer disease)   . Schatzki's ring   . Tubular adenoma of colon   . Vitiligo     SURGICAL HISTORY: Past Surgical History:  Procedure Laterality Date  . ATRIAL SEPTAL DEFECT(ASD) CLOSURE N/A 12/30/2018   Procedure: ATRIAL  SEPTAL DEFECT(ASD) CLOSURE;  Surgeon: [name], MD;  Location: [place] [facility];  Service: Cardiovascular;  Laterality: N/A;  . CARDIAC SURGERY     ICD  . CARDIOVERSION N/A 11/09/2019   Procedure: CARDIOVERSION;  Surgeon: [name], MD;  Location: [facility] ENDOSCOPY;  Service: Cardiovascular;  Laterality: N/A;  . CARDIOVERSION N/A 12/21/2019   Procedure: CARDIOVERSION;  Surgeon: [name], MD;  Location: [place];  Service: Cardiovascular;  Laterality: N/A;  . CATARACT EXTRACTION W/PHACO Right 07/26/2020   Procedure: CATARACT EXTRACTION PHACO AND INTRAOCULAR LENS PLACEMENT ([place]) RIGHT VISION BLUE;  Surgeon: Birder Robson, MD;  Location: [facility] ORS;  Service: Ophthalmology;  Laterality: Right;  [place] 01:14.1 CDE 13.65 Fluid Pack Lot # [id number]  . COLONOSCOPY WITH ESOPHAGOGASTRODUODENOSCOPY (EGD)    . COLONOSCOPY WITH PROPOFOL N/A 08/24/2017   Procedure: COLONOSCOPY WITH PROPOFOL;  Surgeon: [name], MD;  Location: [facility] ENDOSCOPY;  Service: Endoscopy;  Laterality: N/A;  . COLONOSCOPY WITH PROPOFOL N/A 07/23/2020   Procedure: COLONOSCOPY WITH PROPOFOL;  Surgeon: Tol[place], [name], MD;  Location: [facility] ENDOSCOPY;  Service: Gastroenterology;  Laterality: N/A;  . CORONARY ARTERY BYPASS GRAFT N/A 08/03/2014   Procedure: CORONARY ARTERY BYPASS GRAFTING (CABG);  Surgeon: [name], MD;  Location: [place];  Service: Open Heart Surgery;  Laterality: N/A;  . ENTEROSCOPY N/A 08/06/2020   Procedure: ENTEROSCOPY;  Surgeon: Jonathon Bellows, MD;  Location: [facility]COPY;  Service: Gastroenterology;  Laterality: N/A;  . EP IMPLANTABLE DEVICE N/A 05/01/2015   MDT ICD implanted for primary prevention of sudden death  . ESOPHAGOGASTRODUODENOSCOPY N/A 06/28/2020   Procedure: ESOPHAGOGASTRODUODENOSCOPY (EGD);  Surgeon: Clarene Essex, MD;  Location: [place];  Service: Endoscopy;  Laterality: N/A;  . ESOPHAGOGASTRODUODENOSCOPY (EGD) WITH PROPOFOL N/A 04/16/2015   Procedure:  ESOPHAGOGASTRODUODENOSCOPY (EGD) WITH PROPOFOL;  Surgeon: [name], MD;  Location: [facility] ENDOSCOPY;  Service: Endoscopy;  Laterality: N/A;  . ESOPHAGOGASTRODUODENOSCOPY (EGD) WITH PROPOFOL N/A 08/24/2017   Procedure: ESOPHAGOGASTRODUODENOSCOPY (EGD) WITH PROPOFOL;  Surgeon: [name], MD;  Location: [facility] ENDOSCOPY;  Service: Endoscopy;  Laterality: N/A;  . FRACTURE SURGERY Left    ORIF lower leg  . GIVENS CAPSULE STUDY N/A 08/03/2020   Procedure: GIVENS CAPSULE STUDY;  Surgeon: Jonathon Bellows, MD;  Location: [facility] ENDOSCOPY;  Service: Gastroenterology;  Laterality: N/A;  . HERNIA REPAIR    . HIP PINNING,CANNULATED Left 07/22/2018   Procedure: CANNULATED HIP PINNING;  Surgeon: [name], MD;  Location: [place];  Service: Orthopedics;  Laterality: Left;  . MITRAL VALVE REPAIR N/A 12/30/2018   Procedure: MITRAL VALVE REPAIR;  Surgeon: [name], MD;  Location: [place] [facility];  Service: Cardiovascular;  Laterality: N/A;  . RIGHT/LEFT HEART CATH AND CORONARY/GRAFT ANGIOGRAPHY N/A 12/21/2018   Procedure: RIGHT/LEFT HEART CATH AND CORONARY/GRAFT ANGIOGRAPHY;  Surgeon: [name], MD;  Location: [place] [facility];  Service:  Cardiovascular;  Laterality: N/A;  . TEE WITHOUT CARDIOVERSION N/A 08/03/2014   Procedure: TRANSESOPHAGEAL ECHOCARDIOGRAM (TEE);  Surgeon: [name], MD;  Location: [place];  Service: Open Heart Surgery;  Laterality: N/A;  . TEE WITHOUT CARDIOVERSION N/A 02/10/2018   Procedure: TRANSESOPHAGEAL ECHOCARDIOGRAM (TEE);  Surgeon: [name], MD;  Location: [facility] ENDOSCOPY;  Service: Cardiovascular;  Laterality: N/A;  . TEE WITHOUT CARDIOVERSION N/A 11/09/2019   Procedure: TRANSESOPHAGEAL ECHOCARDIOGRAM (TEE);  Surgeon: [name], MD;  Location: [facility] ENDOSCOPY;  Service: Cardiovascular;  Laterality: N/A;    SOCIAL HISTORY: Social History   Socioeconomic History  . Marital status: Single    Spouse name: Not on file  . Number of children: Not on  file  . Years of education: Not on file  . Highest education level: Not on file  Occupational History  . Not on file  Tobacco Use  . Smoking status: Former Smoker    Quit date: 2014    Years since quitting: 8.0  . Smokeless tobacco: Never Used  . Tobacco comment: 07/30/2017 Quit in 2014  Vaping Use  . Vaping Use: Never used  Substance and Sexual Activity  . Alcohol use: No    Alcohol/week: 0.0 standard drinks  . Drug use: No  . Sexual activity: Never  Other Topics Concern  . Not on file  Social History Narrative   Pt lives in [place] with his mother.   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 Valvular heart disease Mother        Ruptured valve  . CAD Father   . Heart Problems Brother        Stents x 4  . Diabetes Brother   . Prostate cancer Neg Hx   . Bladder Cancer Neg Hx   . Kidney cancer Neg Hx     ALLERGIES:  is allergic to spironolactone.  MEDICATIONS:  Current Outpatient Medications  Medication Sig Dispense Refill  . acetaminophen (TYLENOL) 500 MG tablet Take 500-1,000 mg by mouth every 6 (six) hours as needed (for pain.).    Marland Kitchen allopurinol (ZYLOPRIM) 100 MG tablet Take 100 mg by mouth daily.     Marland Kitchen amiodarone (PACERONE) 200 MG tablet Take 1 tablet (200 mg total) by mouth daily. 90 tablet 1  . apixaban (ELIQUIS) 2.5 MG TABS tablet Take 1 tablet (2.5 mg total) by mouth 2 (two) times daily. 60 tablet 0  . Ascorbic Acid (VITAMIN C) 100 MG tablet Take 100 mg by mouth daily.    Marland Kitchen atorvastatin (LIPITOR) 40 MG tablet TAKE 1 TABLET (40 MG TOTAL) BY MOUTH DAILY AT 6 PM. 90 tablet 3  . carvedilol (COREG) 3.125 MG tablet Take 1 tablet  (3.125 mg total) by mouth 2 (two) times daily. 60 tablet 3  . Cholecalciferol 50 MCG (2000 UT) TABS Take 2,000 Units by mouth at bedtime.     . Cyanocobalamin (VITAMIN B 12) 500 MCG TABS Take by mouth.    . cyanocobalamin 500 MCG tablet Take 500 mcg by mouth daily.     Marland Kitchen docusate sodium (COLACE) 100 MG capsule Take 100 mg by mouth daily as needed  for mild constipation.    . DULoxetine (CYMBALTA) 30 MG capsule Take 30 mg by mouth at bedtime.     . ferrous sulfate 325 (65 FE) MG tablet Take 1 tablet (325 mg total) by mouth daily with breakfast. 30 tablet 3  . ketoconazole (NIZORAL) 2 % shampoo Apply 1 application topically 2 (two) times a week. Mon and Thu    . lactulose (CHRONULAC) 10 GM/15ML solution Take 30 mLs (20 g total) by mouth daily. 236 mL 0  . metolazone (ZAROXOLYN) 2.5 MG tablet Take 1 tablet twice a week, every Monday and Thursday. Take 20 mEq Potassium with Metolazone. 24 tablet 3  . Multiple Vitamin (MULTIVITAMIN) tablet Take 1 tablet by mouth at bedtime.     . Nutritional Supplements (ENSURE COMPLETE SHAKE) LIQD Take 1 Can by mouth 3 (three) times daily. Between meals 237 mL 11  . pantoprazole (PROTONIX) 40 MG tablet TAKE 1 TABLET BY MOUTH EVERY DAY 90 tablet 2  . potassium chloride SA (KLOR-CON M20) 20 MEQ tablet Take 1 tablet (20 mEq total) by mouth daily. 30 tablet 3  . sucralfate (CARAFATE) 1 g tablet Take 1 tablet (1 g total) by mouth 2 (two) times daily.    Marland Kitchen torsemide (DEMADEX) 20 MG tablet Take 3 tablets (60 mg total) by mouth daily. 90 tablet 0  . gabapentin (NEURONTIN) 300 MG capsule Take 300 mg by mouth daily.     No current facility-administered medications for this visit.     PHYSICAL EXAMINATION: ECOG PERFORMANCE STATUS: 3 - Symptomatic, >50% confined to bed Vitals:   09/05/20 1401  BP: (!) 88/62  Pulse: 67  Resp: 16  Temp: (!) 97.5 F (36.4 C)   Filed Weights   09/05/20 1401  Weight: 141 lb 9.6 oz (64.2 kg)    Physical Exam Constitutional:       General: He is not in acute distress.    Appearance: He is not ill-appearing.     Comments: Patient sits in the wheelchair  HENT:     Head: Normocephalic and atraumatic.  Eyes:     General: No scleral icterus.    Pupils: Pupils are equal, round, and reactive to light.  Cardiovascular:     Rate and Rhythm: Normal rate and regular rhythm.     Heart sounds: Normal heart sounds.  Pulmonary:     Effort: Pulmonary effort is normal. No respiratory distress.     Breath sounds: Normal breath sounds. No wheezing.  Abdominal:     General: Bowel sounds are normal. There is no distension.     Palpations: Abdomen is soft. There is no mass.     Tenderness: There is no abdominal tenderness.  Musculoskeletal:        General: No swelling or deformity. Normal range of motion.     Cervical back: Normal range of motion and neck supple.  Skin:    General: Skin is warm and dry.     Findings: No erythema or rash.  Neurological:     Mental Status: He is alert. Mental status is at baseline.     Comments: Patient unable to provide any meaningful history      LABORATORY DATA:  I have reviewed the data as listed Lab Results  Component Value Date   WBC 5.8 09/03/2020   HGB 8.9 (L) 09/03/2020   HCT 27.1 (L) 09/03/2020   MCV 97.1 09/03/2020   PLT 351 09/03/2020   Recent Labs    01/12/20 1445 04/20/20 1206 04/26/20 1240  05/21/20 1151 05/24/20 0953 06/14/20 1456 08/02/20 2116 08/03/20 0421 08/05/20 0440 08/06/20 0637 08/08/20 0607 08/11/20 1944 08/20/20 1408 08/27/20 1637  NA 129*   < > 130* 125* 127*   < > 131*   < > 130* 133*   < > 131* 133* 132*  K 3.6   < > 4.0 4.4 3.4*   < > 3.0*   < > 4.3 3.8   < > 4.7 3.9 3.8  CL 86*   < > 92* 85* 91*   < > 87*   < > 95* 97*   < > 97* 96* 88*  CO2 28   < > 27 26 27    < > 30   < > 25 25   < > 21* 26 31  GLUCOSE 103*   < > 138* 108* 150*   < > 125*   < > 123* 112*   < > 86 93 113*  BUN 64*   < > 51* 92* 60*   < > 82*   < > 78* 62*   < > 23 24* 55*   CREATININE 2.00*   < > 2.60* 3.62* 2.65*   < > 2.91*   < > 2.42* 2.03*   < > 1.86* 2.15* 2.45*  CALCIUM 9.8   < > 9.7 9.5 9.2   < > 8.8*   < > 9.0 9.2   < > 9.1 9.0 9.6  GFRNONAA 33*   < > 24* 16* 24*   < > 23*   < > 28* 35*   < > 39* 33* 28*  GFRAA 39*   < > 28* 19* 27*  --   --   --   --   --   --   --   --   --   PROT 7.8   < >  --   --   --    < > 6.2*   < > 6.3* 6.0*  --   --  6.7  --   ALBUMIN 3.8   < >  --   --   --    < > 3.1*   < > 3.0* 3.0*  --   --  3.0*  --   AST 25   < >  --   --   --    < > 49*   < > 54* 49*  --   --  31  --   ALT 19   < >  --   --   --    < > 32   < > 33 31  --   --  18  --   ALKPHOS 129*   < >  --   --   --    < > 114   < > 112 108  --   --  202*  --   BILITOT 1.5*   < >  --   --   --    < > 1.8*   < > 2.4* 2.6*  --   --  1.2  --   BILIDIR 0.3*  --   --   --   --   --  0.7*  --   --   --   --   --   --   --   IBILI 1.2*  --   --   --   --   --  1.1*  --   --   --   --   --   --   --    < > =  values in this interval not displayed.   Iron/TIBC/Ferritin/ %Sat    Component Value Date/Time   IRON 51 09/03/2020 1344   TIBC 412 09/03/2020 1344   FERRITIN 65 09/03/2020 1344   IRONPCTSAT 12 (L) 09/03/2020 1344     RADIOGRAPHIC STUDIES: I have personally reviewed the radiological images as listed and agreed with the findings in the report. DG Lumbar Spine 2-3 Views  Result Date: 08/02/2020 CLINICAL DATA:  Fall EXAM: LUMBAR SPINE - 2-3 VIEW COMPARISON:  CT 06/20/2019 FINDINGS: Lumbar alignment within normal limits. Vertebral body heights are maintained. Mild degenerative changes at L2-L3. Facet degenerative changes of the lower lumbar spine. Aortic atherosclerosis. IMPRESSION: No acute osseous abnormality. Electronically Signed   By: Donavan Foil M.D.   On: 08/02/2020 21:10   CT Head Wo Contrast  Result Date: 08/02/2020 CLINICAL DATA:  Fall EXAM: CT HEAD WITHOUT CONTRAST TECHNIQUE: Contiguous axial images were obtained from the base of the skull through the  vertex without intravenous contrast. COMPARISON:  October 16, 2018 FINDINGS: Brain: No evidence of acute territorial infarction, hemorrhage, hydrocephalus,extra-axial collection or mass lesion/mass effect. There is dilatation the ventricles and sulci consistent with age-related atrophy. Low-attenuation changes in the deep white matter consistent with small vessel ischemia. Vascular: No hyperdense vessel or unexpected calcification. Skull: The skull is intact. No fracture or focal lesion identified. Sinuses/Orbits: Small amount of fluid seen within the sphenoid sinuses. The orbits and globes intact. Other: None Cervical spine: Alignment: There is a grade 1 anterolisthesis of C4-C5 measuring 3 mm. Skull base and vertebrae: Visualized skull base is intact. No atlanto-occipital dissociation. The vertebral body heights are well maintained. No fracture or pathologic osseous lesion seen. Soft tissues and spinal canal: The visualized paraspinal soft tissues are unremarkable. No prevertebral soft tissue swelling is seen. The spinal canal is grossly unremarkable, no large epidural collection or significant canal narrowing. Disc levels: Cervical spine spondylosis is seen disc osteophyte uncovertebral osteophytes most notable C3-C4 with severe left neural foraminal narrowing and central canal stenosis. Upper chest: Biapical scarring is seen. Thoracic inlet is within normal limits. Other: None IMPRESSION: No acute intracranial abnormality. Findings consistent with age related atrophy and chronic small vessel ischemia No acute fracture or malalignment of the spine. Stable grade 1 anterolisthesis C4 on C5. Cervical spine spondylosis most notable at C3-C4. Electronically Signed   By: Prudencio Pair M.D.   On: 08/02/2020 21:06   CT Cervical Spine Wo Contrast  Result Date: 08/02/2020 CLINICAL DATA:  Fall EXAM: CT HEAD WITHOUT CONTRAST TECHNIQUE: Contiguous axial images were obtained from the base of the skull through the vertex  without intravenous contrast. COMPARISON:  October 16, 2018 FINDINGS: Brain: No evidence of acute territorial infarction, hemorrhage, hydrocephalus,extra-axial collection or mass lesion/mass effect. There is dilatation the ventricles and sulci consistent with age-related atrophy. Low-attenuation changes in the deep white matter consistent with small vessel ischemia. Vascular: No hyperdense vessel or unexpected calcification. Skull: The skull is intact. No fracture or focal lesion identified. Sinuses/Orbits: Small amount of fluid seen within the sphenoid sinuses. The orbits and globes intact. Other: None Cervical spine: Alignment: There is a grade 1 anterolisthesis of C4-C5 measuring 3 mm. Skull base and vertebrae: Visualized skull base is intact. No atlanto-occipital dissociation. The vertebral body heights are well maintained. No fracture or pathologic osseous lesion seen. Soft tissues and spinal canal: The visualized paraspinal soft tissues are unremarkable. No prevertebral soft tissue swelling is seen. The spinal canal is grossly unremarkable, no large epidural collection or significant canal  narrowing. Disc levels: Cervical spine spondylosis is seen disc osteophyte uncovertebral osteophytes most notable C3-C4 with severe left neural foraminal narrowing and central canal stenosis. Upper chest: Biapical scarring is seen. Thoracic inlet is within normal limits. Other: None IMPRESSION: No acute intracranial abnormality. Findings consistent with age related atrophy and chronic small vessel ischemia No acute fracture or malalignment of the spine. Stable grade 1 anterolisthesis C4 on C5. Cervical spine spondylosis most notable at C3-C4. Electronically Signed   By: Prudencio Pair M.D.   On: 08/02/2020 21:06   DG Chest Port 1 View  Result Date: 08/12/2020 CLINICAL DATA:  Neck swelling. Jugular vein distension. Coronary artery disease and congestive heart failure. EXAM: PORTABLE CHEST 1 VIEW COMPARISON:  06/27/2020  FINDINGS: Stable cardiomegaly. Single lead transvenous pacemaker remains in appropriate position. Prior CABG again noted. Mild worsening of diffuse interstitial infiltrates are seen, most likely due to interstitial edema. Increased atelectasis or consolidation is seen in the left retrocardiac lung base. Left pleural effusion cannot be excluded. IMPRESSION: Mild worsening of diffuse interstitial edema, likely due to congestive heart failure. Increased atelectasis or consolidation in left retrocardiac lung base. Left pleural effusion cannot be excluded. Electronically Signed   By: Marlaine Hind M.D.   On: 08/12/2020 06:29   DG Chest Portable 1 View  Result Date: 06/27/2020 CLINICAL DATA:  70 year old male with fatigue. Lower extremity swelling. EXAM: PORTABLE CHEST 1 VIEW COMPARISON:  Chest radiographs 03/03/2019 and earlier. FINDINGS: Portable AP semi upright view at 1210 hours. Chronic left chest cardiac AICD. Prior CABG. Stable cardiomegaly and mediastinal contours. Mildly lower lung volumes. Mild new veiling opacity at the lung bases suspicious for pleural effusion. Pulmonary vascular congestion similar to but increased from last year. No pneumothorax. No air bronchograms. Paucity of bowel gas in the upper abdomen. Chronic right rib fractures. No acute osseous abnormality identified. IMPRESSION: Suspect acute pulmonary interstitial edema with small bilateral pleural effusions. Chronic cardiomegaly, AICD. Electronically Signed   By: Genevie Ann M.D.   On: 06/27/2020 12:23   CUP PACEART REMOTE DEVICE CHECK  Result Date: 07/27/2020 Scheduled remote reviewed. Normal device function.  Optivol elevated and thoracic impedance below reference line and trending down. Routing to triage for further evaluation. Next remote 91 days. HB    ASSESSMENT & PLAN:  1. Iron deficiency anemia due to chronic blood loss   2. Anemia in stage 3a chronic kidney disease (HCC)   3. Other cirrhosis of liver (HCC)    #Anemia,  multifactorial due to iron deficiency due to chronic blood loss as well as anemia secondary to chronic kidney disease I called patient's brother [name] and updated him about lab results, management plan as below -Iron deficiency, labs are reviewed.  Hemoglobin has dropped to 8.9, iron panel showed iron deficiency. This is consistent with ongoing blood loss. Recommend IV Venofer today, repeat IV Venofer x2 next week. Follow-up in 2 weeks, 6 weeks repeat H&H +/- Retacrit. I discussed with [name] about rationale and potential side effects of erythropoietin replacement therapy. [name] agrees with the plan and will relay information to patient's [place] Pyo.  Chronic GI blood loss, from AVM status post APC treatment recently. Cirrhosis, due to fatty liver disease.   Today's blood work and note to gastroenterology. Recommend patient to  follow-up with GI.  Patient will follow-up with me in 10 weeks, lab MD +/- Retacrit +/- Venofer All questions were answered. The patient knows to call the clinic with any problems questions or concerns. Ollen Gross, MD  Earlie Server, MD, PhD 09/05/2020

## 2020-09-05 NOTE — Progress Notes (Signed)
Unable to assess patient secondary to his cognitive status. Medications reconciled with care home list.

## 2020-09-05 NOTE — Progress Notes (Signed)
EPIC Encounter for ICM Monitoring  Patient Name: CULLEY HEDEEN is a 70 y.o. male Date: 09/05/2020 Primary Care Physican: [name], MD Primary Cardiologist:[place] Electrophysiologist: Allred 12/1/2021Weight: 164lbs  Time in AF0.0hr/day (0.0%)  Transmission reviewed.  Patient now residing at [facility].  OptivolThoracic impedance suggesting possible fluid accumulation returning after being at baseline normal for a week.   Prescribed:  Torsemide20mg take3tablets(60mg  total)by mouthdaily.  Metolazone 2.5 mgTake 1 tablet (2.5 mg total) by mouth twice a week on Monday and Thursday. Take 20 mEq Potassium with Metolazone.  Potassium 20 mEqTake 1 tablet (20 mEq total)by mouth daily. Also take extra 20 mEq on Metolazone days.  Labs: 08/11/2020 Creatinine 1.86, BUN 23, Potassium 4.7, Sodium 131, GFR 39 08/08/2020 Creatinine 1.78, BUN 39, Potassium 3.4, Sodium 133, GFR 41  08/06/2020 Creatinine 2.03, BUN 62, Potassium 3.8, Sodium 133, GFR 35  08/05/2020 Creatinine 2.42, BUN 78, Potassium 4.3, Sodium 130, GFR 28  08/04/2020 Creatinine 2.89, BUN 88, Potassium 2.6, Sodium 133, GFR 23  08/03/2020 Creatinine 2.97, BUN 89, Potassium 2.9, Sodium 130, GFR 22  08/02/2020 Creatinine 2.91, BUN 82, Potassium 3.0, Sodium 131, GFR 23 (9:16 PM) 08/02/2020 Creatinine 2.91, BUN 72, Potassium 2.6, Sodium 130, GFR 23 (2:24 PM)  A complete set of results can be found in Results Review  Recommendations:Will recheck fluid levels after patient has 2nd weekly dosage of Metolazone.  Follow-up plan: ICM clinic phone appointment on1/14/2022 to recheck fluid levels 91 day device clinic remote transmission3/10/2020.  EP/Cardiology Office Visits: 10/08/2020 with HF Clinic.  Copy of ICM check sent to Dr.Allred.  3 month ICM trend: 09/05/2020.    1 Year ICM trend:       Rosalene Billings, RN 09/05/2020 7:51 AM

## 2020-09-07 ENCOUNTER — Telehealth: Payer: Self-pay

## 2020-09-07 ENCOUNTER — Ambulatory Visit (INDEPENDENT_AMBULATORY_CARE_PROVIDER_SITE_OTHER): Payer: Medicare Other

## 2020-09-07 DIAGNOSIS — Z95810 Presence of automatic (implantable) cardiac defibrillator: Secondary | ICD-10-CM

## 2020-09-07 DIAGNOSIS — I5022 Chronic systolic (congestive) heart failure: Secondary | ICD-10-CM

## 2020-09-07 NOTE — Telephone Encounter (Signed)
Attempted call and transferred to [employer] identified as Doctor, hospital.  Left message to return call and provided ICM number.

## 2020-09-07 NOTE — Telephone Encounter (Signed)
Attempted ICM call to [employer] to inquire about patients current status and medications being given during inpatient stay.  Per patient's brother Carl [name], patient is to be discharged home today.

## 2020-09-07 NOTE — Progress Notes (Signed)
Attempted call to [facility] at [phone] to inquire about patients current Torsemide dosage and if he is receiving Metolazone.  Phone call transferred to nursing desk but no answer.

## 2020-09-07 NOTE — Progress Notes (Signed)
EPIC Encounter for ICM Monitoring  Patient Name: Carl [name] is a 70 y.o. male Date: 09/07/2020 Primary Care Physican: [name], MD Primary [place] Electrophysiologist: Allred 12/1/2021Weight: 164lbs  Time in AF0.0hr/day (0.0%)  Spoke with brother.  He reports patient is supposed to be discharged from [facility] to home today, 09/07/2020.  He is unsure if patient is being given Metolazone in the facility.    Patient received iron infusion on 09/05/2020.   OptivolThoracic impedance suggesting possible fluid accumulation since 08/30/2020.   Prescribed:  Torsemide20mg take3tablets(60mg  total)by mouthdaily.  Metolazone 2.5 mgTake 1 tablet (2.5 mg total) by mouthtwice a week on Monday and Thursday. Take 20 mEq Potassium with Metolazone.  Potassium 20 mEqTake 1 tablet (20 mEq total)by mouth daily. Also take extra 20 mEq on Metolazone days.  Labs: 08/11/2020 Creatinine1.86, BUN23, Potassium4.7, Sodium131, GFR39 08/08/2020 Creatinine1.78, BUN39, Potassium3.4, Sodium133, GFR41  08/06/2020 Creatinine2.03, BUN62, Potassium3.8, Sodium133, GFR35  08/05/2020 Creatinine2.42, BUN78, Potassium4.3, Sodium130, XHB71  08/04/2020 Creatinine2.89, BUN88, Potassium2.6, [id number], GFR23  08/03/2020 Creatinine2.97, BUN89, Potassium2.9, Sodium130, GFR22  08/02/2020 Creatinine2.91, BUN82, Potassium3.0, Sodium131, OFB51 (9:16 PM) 08/02/2020 Creatinine2.91, BUN72, Potassium2.6, Sodium130, WCH85 (2:24 PM) A complete set of results can be found in Results Review  Recommendations:Copy sent to Dr [name] for review.  Unsure if nursing staff can be reached at [employer].  Follow-up plan: ICM clinic phone appointment on1/17/2022 (manual) to recheck fluid levels91 day device clinic remote transmission3/10/2020.  EP/Cardiology Office Visits: 10/08/2020 with HF Clinic.  Copy  of ICM check sent to Dr.Allred.  3 month ICM trend: 09/07/2020.    1 Year ICM trend:       Rosalene Billings, RN 09/07/2020 8:55 AM

## 2020-09-07 NOTE — Progress Notes (Signed)
Attempted 2nd call to [employer] and left message for unit manager to return call.

## 2020-09-07 NOTE — Progress Notes (Signed)
Would increase torsemide to 80 mg daily and call his brother to confirm what he is taking.  BMET 1 week.

## 2020-09-10 NOTE — Progress Notes (Signed)
Spoke with brother [name], per [place]. He reports patient was discharged home from [facility] facility on Friday 1/14.  He gave patient Torsemide 60 daily since being at home.  Advised Dr [name] recommended to increase Torsemide to 80 mg daily.  He said patient is staying at his daughter's house until the weather improves and does not have the monitor with him.  Advised to start increased Torsemide dosage today and call Dr Claris Gladden office for post hospital/rehab follow up and he agreed to call today.   BMET scheduled for 1 week and patient will have drawn at [place] since he lives in [place].   ICM remote transmission rescheduled to 10/01/2020.    Message sent to [name], EMT paramedicine program due to brother said she will be resuming home visits to assist with meds.  Updated Krisiti new Torsemide dosage and advised to call Dr Claris Gladden office for any further recommendation over the next 2 weeks since I am out of the office.

## 2020-09-12 ENCOUNTER — Inpatient Hospital Stay: Payer: Medicare Other

## 2020-09-12 ENCOUNTER — Other Ambulatory Visit: Payer: Self-pay

## 2020-09-12 VITALS — BP 93/60 | HR 60 | Temp 98.0°F | Resp 18

## 2020-09-12 DIAGNOSIS — D509 Iron deficiency anemia, unspecified: Secondary | ICD-10-CM

## 2020-09-12 DIAGNOSIS — N1831 Chronic kidney disease, stage 3a: Secondary | ICD-10-CM | POA: Diagnosis not present

## 2020-09-12 MED ORDER — SODIUM CHLORIDE 0.9 % IV SOLN
Freq: Once | INTRAVENOUS | Status: AC
  Filled 2020-09-12: qty 250

## 2020-09-12 MED ORDER — IRON SUCROSE 20 MG/ML IV SOLN
200.0000 mg | Freq: Once | INTRAVENOUS | Status: AC
  Administered 2020-09-12: 200 mg via INTRAVENOUS
  Filled 2020-09-12: qty 10

## 2020-09-12 NOTE — Progress Notes (Signed)
Pt received IV venofer in clinic today. VSS @ d/c. 

## 2020-09-14 ENCOUNTER — Inpatient Hospital Stay: Payer: Medicare Other

## 2020-09-17 ENCOUNTER — Other Ambulatory Visit
Admission: RE | Admit: 2020-09-17 | Discharge: 2020-09-17 | Disposition: A | Discharge status: Home / Self Care | Payer: Medicare Other | Source: Ambulatory Visit | Attending: Cardiology | Admitting: Cardiology

## 2020-09-17 DIAGNOSIS — I5022 Chronic systolic (congestive) heart failure: Secondary | ICD-10-CM | POA: Insufficient documentation

## 2020-09-17 LAB — BASIC METABOLIC PANEL
Anion gap: 9 (ref 5–15)
BUN: 27 mg/dL — ABNORMAL HIGH (ref 8–23)
CO2: 27 mmol/L (ref 22–32)
Calcium: 8.4 mg/dL — ABNORMAL LOW (ref 8.9–10.3)
Chloride: 96 mmol/L — ABNORMAL LOW (ref 98–111)
Creatinine, Ser: 1.69 mg/dL — ABNORMAL HIGH (ref 0.61–1.24)
GFR, Estimated: 43 mL/min — ABNORMAL LOW (ref >60)
Glucose, Bld: 135 mg/dL — ABNORMAL HIGH (ref 70–99)
Potassium: 3.7 mmol/L (ref 3.5–5.1)
Sodium: 132 mmol/L — ABNORMAL LOW (ref 135–145)
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
## 2020-09-18 ENCOUNTER — Telehealth (HOSPITAL_COMMUNITY): Payer: Self-pay
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
## 2020-09-19 ENCOUNTER — Other Ambulatory Visit (HOSPITAL_COMMUNITY): Payer: Self-pay

## 2020-09-19 ENCOUNTER — Other Ambulatory Visit: Payer: Self-pay

## 2020-09-19 ENCOUNTER — Inpatient Hospital Stay: Payer: Medicare Other

## 2020-09-19 ENCOUNTER — Encounter (HOSPITAL_COMMUNITY): Payer: Self-pay

## 2020-09-19 ENCOUNTER — Ambulatory Visit (HOSPITAL_COMMUNITY)
Admission: RE | Admit: 2020-09-19 | Discharge: 2020-09-19 | Disposition: A | Discharge status: Home / Self Care | Payer: Medicare Other | Source: Ambulatory Visit | Attending: Family Medicine | Admitting: Family Medicine

## 2020-09-19 VITALS — BP 107/69 | HR 70 | Wt 152.2 lb

## 2020-09-19 VITALS — BP 97/60 | HR 63

## 2020-09-19 DIAGNOSIS — Z7901 Long term (current) use of anticoagulants: Secondary | ICD-10-CM | POA: Insufficient documentation

## 2020-09-19 DIAGNOSIS — I255 Ischemic cardiomyopathy: Secondary | ICD-10-CM

## 2020-09-19 DIAGNOSIS — Z8249 Family history of ischemic heart disease and other diseases of the circulatory system: Secondary | ICD-10-CM | POA: Insufficient documentation

## 2020-09-19 DIAGNOSIS — Z951 Presence of aortocoronary bypass graft: Secondary | ICD-10-CM | POA: Insufficient documentation

## 2020-09-19 DIAGNOSIS — I493 Ventricular premature depolarization: Secondary | ICD-10-CM

## 2020-09-19 DIAGNOSIS — K7460 Unspecified cirrhosis of liver: Secondary | ICD-10-CM

## 2020-09-19 DIAGNOSIS — I340 Nonrheumatic mitral (valve) insufficiency: Secondary | ICD-10-CM

## 2020-09-19 DIAGNOSIS — I480 Paroxysmal atrial fibrillation: Secondary | ICD-10-CM

## 2020-09-19 DIAGNOSIS — Z79899 Other long term (current) drug therapy: Secondary | ICD-10-CM | POA: Diagnosis not present

## 2020-09-19 DIAGNOSIS — I2510 Atherosclerotic heart disease of native coronary artery without angina pectoris: Secondary | ICD-10-CM

## 2020-09-19 DIAGNOSIS — F329 Major depressive disorder, single episode, unspecified: Secondary | ICD-10-CM | POA: Insufficient documentation

## 2020-09-19 DIAGNOSIS — J90 Pleural effusion, not elsewhere classified: Secondary | ICD-10-CM | POA: Diagnosis not present

## 2020-09-19 DIAGNOSIS — D509 Iron deficiency anemia, unspecified: Secondary | ICD-10-CM

## 2020-09-19 DIAGNOSIS — N1831 Chronic kidney disease, stage 3a: Secondary | ICD-10-CM | POA: Diagnosis not present

## 2020-09-19 DIAGNOSIS — F70 Mild intellectual disabilities: Secondary | ICD-10-CM | POA: Insufficient documentation

## 2020-09-19 DIAGNOSIS — F32A Depression, unspecified: Secondary | ICD-10-CM | POA: Diagnosis not present

## 2020-09-19 DIAGNOSIS — D649 Anemia, unspecified: Secondary | ICD-10-CM

## 2020-09-19 DIAGNOSIS — R5381 Other malaise: Secondary | ICD-10-CM

## 2020-09-19 DIAGNOSIS — D631 Anemia in chronic kidney disease: Secondary | ICD-10-CM | POA: Diagnosis not present

## 2020-09-19 DIAGNOSIS — I5022 Chronic systolic (congestive) heart failure: Secondary | ICD-10-CM | POA: Insufficient documentation

## 2020-09-19 DIAGNOSIS — N182 Chronic kidney disease, stage 2 (mild): Secondary | ICD-10-CM

## 2020-09-19 DIAGNOSIS — Z9889 Other specified postprocedural states: Secondary | ICD-10-CM

## 2020-09-19 DIAGNOSIS — Z87891 Personal history of nicotine dependence: Secondary | ICD-10-CM | POA: Diagnosis not present

## 2020-09-19 DIAGNOSIS — N183 Chronic kidney disease, stage 3 unspecified: Secondary | ICD-10-CM | POA: Insufficient documentation

## 2020-09-19 LAB — BASIC METABOLIC PANEL
Anion gap: 10 (ref 5–15)
BUN: 33 mg/dL — ABNORMAL HIGH (ref 8–23)
CO2: 26 mmol/L (ref 22–32)
Calcium: 8.8 mg/dL — ABNORMAL LOW (ref 8.9–10.3)
Chloride: 94 mmol/L — ABNORMAL LOW (ref 98–111)
Creatinine, Ser: 1.91 mg/dL — ABNORMAL HIGH (ref 0.61–1.24)
GFR, Estimated: 37 mL/min — ABNORMAL LOW (ref >60)
Glucose, Bld: 95 mg/dL (ref 70–99)
Potassium: 4.6 mmol/L (ref 3.5–5.1)
Sodium: 130 mmol/L — ABNORMAL LOW (ref 135–145)

## 2020-09-19 LAB — HEMOGLOBIN AND HEMATOCRIT, BLOOD
HCT: 25.2 % — ABNORMAL LOW (ref 39.0–52.0)
Hemoglobin: 8.1 g/dL — ABNORMAL LOW (ref 13.0–17.0)

## 2020-09-19 MED ORDER — TORSEMIDE 20 MG PO TABS: 80.0000 mg | ORAL_TABLET | Freq: Every day | ORAL | 0 refills | Status: DC

## 2020-09-19 MED ORDER — EPOETIN ALFA-EPBX 10000 UNIT/ML IJ SOLN
20000.0000 [IU] | Freq: Once | INTRAMUSCULAR | Status: AC
  Administered 2020-09-19: 20000 [IU] via SUBCUTANEOUS
  Filled 2020-09-19: qty 2

## 2020-09-19 NOTE — Patient Instructions (Addendum)
Take metolazone 2.5mg  today and tomorrow. *Then resume taking 2.5mg  every Monday and Thursday*  Take lasix 80mg  daily  Routine lab work today. Will notify you of abnormal results  Repeat labs in 10days (at [facility])   Follow up in 4-6 weeks  Do the following things EVERYDAY: 1) Weigh yourself in the morning before breakfast. Write it down and keep it in a log. 2) Take your medicines as prescribed 3) Eat low salt foods-Limit salt (sodium) to 2000 mg per day.  4) Stay as active as you can everyday 5) Limit all fluids for the day to less than 2 liters

## 2020-09-19 NOTE — Progress Notes (Signed)
Today had a visit with [place] to fix his medication boxes.  He had HF clinic appt today, seen where they want him to take metolazone today, then start metolazone back on Mondays and Thursdays.  Take extra potassium when take metolazone.  He is also too make sure he takes torsemide 80 mg every morning.  He has all his medications and refilled his med boxes.  Jeffrie is still staying at his daughters, [name] his brother is wanting to get him to start staying at his home, [name] does not want me to visit at Arnav's daughters.  He states will bring Payson to the house when I need to visit.  Advised to get Yosgar to start staying during the day at his home and stay at his daughters at night.  I think Standley just may be afraid to stay by his self.  B[place]on appears to be doing ok, he is walking with a walker.  [name] is aware of up coming appts.  He is running out of sucralfate, contacted Dr Ginette Pitman to see if he refill it, it was prescribed by hospitalist at [facility].  Will continue to visit for heart failure, diet and medication management.   [place] [phone]

## 2020-09-19 NOTE — Progress Notes (Signed)
ReDS Vest / Clip - 09/19/20 1000      ReDS Vest / Clip   Station Marker A    Ruler Value 28    ReDS Value Range Moderate volume overload    ReDS Actual Value 39

## 2020-09-22 ENCOUNTER — Other Ambulatory Visit: Payer: Self-pay | Admitting: Internal Medicine

## 2020-09-25 ENCOUNTER — Telehealth: Payer: Self-pay

## 2020-09-25 NOTE — Telephone Encounter (Signed)
ICM call to brother Odilon Cass, per DPR. Explained received message from Allena Katz, NP at HF clinic asking to recheck remote transmission fluid levels after restarting Metolazone last week.   Requested to have patient send remote transmission on 09/26/2020 to check effectiveness of Metolazone.  Patient currently staying at daughters and will have him send remote transmission tomorrow morning.

## 2020-09-26 ENCOUNTER — Telehealth: Payer: Self-pay

## 2020-09-26 ENCOUNTER — Ambulatory Visit (INDEPENDENT_AMBULATORY_CARE_PROVIDER_SITE_OTHER): Payer: Medicare Other

## 2020-09-26 ENCOUNTER — Other Ambulatory Visit (HOSPITAL_COMMUNITY): Payer: Medicare Other

## 2020-09-26 DIAGNOSIS — Z95810 Presence of automatic (implantable) cardiac defibrillator: Secondary | ICD-10-CM

## 2020-09-26 DIAGNOSIS — I5022 Chronic systolic (congestive) heart failure: Secondary | ICD-10-CM

## 2020-09-26 NOTE — Progress Notes (Signed)
Received: Today Mil[place], Jessica M, FNP  Novak Stgermaine S, RN Thank you Jakyle Petrucelli  

## 2020-09-26 NOTE — Progress Notes (Signed)
EPIC Encounter for ICM Monitoring  Patient Name: Carl [name] is a 70 y.o. male Date: 09/26/2020 Primary Care Physican: [name], MD Primary Cardiologist:[place] Electrophysiologist: Allred 12/1/2021Weight: 164lbs  Time in AF0.0hr/day (0.0%)  Spoke with brother, Oslo Huntsman, per DPR on 09/25/2020.  Patient is still staying with his daughter at this time.  He took Metolazone x 2 consecutive days as instructed at 09/18/2020 HF clinic OV and the resumed taking twice a week, Monday and Thursdays.        OptivolThoracic impedance suggesting possible fluid accumulation since 08/30/2020 but has improved in last 2 days.  Prescribed:  Torsemide20mg take3tablets(60mg  total)by mouthdaily.  Metolazone 2.5 mgTake 1 tablet (2.5 mg total) by mouthtwice a week on Monday and Thursday. Take 20 mEq Potassium with Metolazone.  Potassium 20 mEqTake 1 tablet (20 mEq total)by mouth daily. Also take extra 20 mEq on Metolazone days.  Labs: 08/11/2020 Creatinine1.86, BUN23, Potassium4.7, Sodium131, GFR39 08/08/2020 Creatinine1.78, BUN39, Potassium3.4, Sodium133, GFR41  08/06/2020 Creatinine2.03, BUN62, Potassium3.8, Sodium133, GFR35  08/05/2020 Creatinine2.42, BUN78, Potassium4.3, Sodium130, GXQ11  08/04/2020 Creatinine2.89, BUN88, Potassium2.6, HERDEY814, GFR23  08/03/2020 Creatinine2.97, BUN89, Potassium2.9, Sodium130, GFR22  08/02/2020 Creatinine2.91, BUN82, Potassium3.0, Sodium131, GYJ85 (9:16 PM) 08/02/2020 Creatinine2.91, BUN72, Potassium2.6, Sodium130, UDJ49 (2:24 PM) A complete set of results can be found in Results Review  Recommendations:Copy sent to Allena Katz, NP for HF clinic at her request to show effectiveness of Metolazone.    Follow-up plan: ICM clinic phone appointment on2/03/2021.91 day device clinic remote transmission3/10/2020.  EP/Cardiology Office Visits: 10/08/2020 with HF Clinic.  Copy of  ICM check sent to Dr.Allred.  3 month ICM trend: 09/26/2020.    1 Year ICM trend:       Rosalene Billings, RN 09/26/2020 9:30 AM

## 2020-09-26 NOTE — Telephone Encounter (Signed)
Remote ICM transmission received.  Attempted call to brother, Napolean Sia regarding ICM remote transmission and no voice mail option.

## 2020-09-27 ENCOUNTER — Encounter (HOSPITAL_COMMUNITY): Payer: Self-pay

## 2020-09-30 ENCOUNTER — Other Ambulatory Visit (HOSPITAL_COMMUNITY): Payer: Self-pay | Admitting: Cardiology

## 2020-10-01 ENCOUNTER — Telehealth: Payer: Self-pay | Admitting: Internal Medicine

## 2020-10-01 NOTE — Telephone Encounter (Signed)
Called and spoke with Crystal to discuss dosage.

## 2020-10-01 NOTE — Telephone Encounter (Signed)
Pt c/o medication issue:  1. Name of Medication: Eplerenone (Inspra)  2. How are you currently taking this medication (dosage and times per day)? Is currently taking, but not sure how it is being taken.   3. Are you having a reaction (difficulty breathing--STAT)? No   4. What is your medication issue? Crystal is calling to confirm if this patient is supposed to be currently taking this medication and if so how. Please advise.

## 2020-10-02 ENCOUNTER — Ambulatory Visit (INDEPENDENT_AMBULATORY_CARE_PROVIDER_SITE_OTHER): Payer: Medicare Other

## 2020-10-02 ENCOUNTER — Telehealth: Payer: Self-pay

## 2020-10-02 DIAGNOSIS — I5022 Chronic systolic (congestive) heart failure: Secondary | ICD-10-CM

## 2020-10-02 DIAGNOSIS — Z95810 Presence of automatic (implantable) cardiac defibrillator: Secondary | ICD-10-CM

## 2020-10-02 NOTE — Progress Notes (Unsigned)
EPIC Encounter for ICM Monitoring  Patient Name: Carl [name] is a 70 y.o. male Date: 10/02/2020 Primary Care Physican: [name], MD Primary [place] Electrophysiologist: Allred 12/1/2021Weight: 164lbs  Time in AF0.0hr/day (0.0%)  Attempted call to brother, Nicola Quesnell, per [facility] and unable to reach.   Transmission reviewed.  Patient is staying at daughters house.  Spoke with [name], EMT paramedicine program spoke with daughter.  Patient has been eating foods high in salt such as hot dogs, processed meats and some restaurant foods.  He also has been drinking >64 oz daily.  Steffanie Dunn advised daughter to eliminate processed foods, avoid restaurant foods if possible and to limit fluid intake to 60 oz daily.   OptivolThoracic impedance suggesting possible fluid accumulationsince 08/30/2020 and showed improvement 2/1 but then possible accumulation worsening again starting 09/27/2020.  Prescribed:  Torsemide20mg take4tablets(80mg  total)by mouthdaily.  Metolazone 2.5 mgTake 1 tablet (2.5 mg total) by mouthtwice a week on Monday and Thursday. Take 20 mEq Potassium with Metolazone.  Potassium 20 mEqTake 1 tablet (20 mEq total)by mouth daily. Also take extra 20 mEq on Metolazone days.  Labs: 09/19/2020 Creatinine 1.91, BUN 33, Potassium 4.6, Sodium 130, GFR 37 09/17/2020 Creatinine 1.69, BUN 27, Potassium 3.7, Sodium 132, GFR 43  08/27/2020 Creatinine 2.45, BUN 55, Potassium 3.8, Sodium 132, GFR 28  08/20/2020 Creatinine 2.15, BUN 24, Potassium 3.9, Sodium 133, GFR 33  08/11/2020 Creatinine1.86, BUN23, Potassium4.7, Sodium131, GFR39 08/08/2020 Creatinine1.78, BUN39, Potassium3.4, Sodium133, GFR41  08/06/2020 Creatinine2.03, BUN62, Potassium3.8, Sodium133, GFR35  08/05/2020 Creatinine2.42, BUN78, Potassium4.3, Sodium130, GFR28  08/04/2020 Creatinine2.89, BUN88, Potassium2.6, [id number], GFR23  08/03/2020  Creatinine2.97, BUN89, Potassium2.9, Sodium130, ULA45  08/02/2020 Creatinine2.91, BUN82, Potassium3.0, Sodium131, XMI68 (9:16 PM) 08/02/2020 Creatinine2.91, BUN72, Potassium2.6, Sodium130, EHO12 (2:24 PM) A complete set of results can be found in Results Review  Recommendations:Copy sent to Dr [name] for review and recommendations if needed.    Follow-up plan: ICM clinic phone appointment on2/15/2022 to recheck fluid levels.91 day device clinic remote transmission3/10/2020.  EP/Cardiology Office Visits: 10/23/2020 with HF Clinic.  Copy of ICM check sent to Dr.Allred.  3 month ICM trend: 10/02/2020.    1 Year ICM trend:       Rosalene Billings, RN 10/02/2020 10:20 AM

## 2020-10-02 NOTE — Telephone Encounter (Signed)
Remote ICM transmission received.  Attempted call to patient regarding ICM remote transmission and no answer or option to leave voice mail.

## 2020-10-03 ENCOUNTER — Encounter (HOSPITAL_COMMUNITY): Payer: Self-pay

## 2020-10-03 MED ORDER — TORSEMIDE 20 MG PO TABS: ORAL_TABLET | ORAL | 2 refills | Status: DC

## 2020-10-03 NOTE — Progress Notes (Signed)
Call to [name], EMT paramedicine program.  Advised Dr [name] recommended to increase Torsemide to 80 mg every morning and 40 mg every evening.  Advised BMET needed in 1 week.  Advised will place BMET order to patient can have drawn at [facility] lab since he lives in [place].  She will call patient's brother, [name] and advise him of the changes.  Patient has supply on hand to increase Torsemide and will call when refill is needed.

## 2020-10-03 NOTE — Progress Notes (Signed)
Increase torsemide to 80 qam/40 qpm with BMET 1 week.

## 2020-10-08 ENCOUNTER — Inpatient Hospital Stay
Admission: EM | Admit: 2020-10-08 | Discharge: 2020-10-16 | DRG: 070 | Disposition: A | Discharge status: Skilled Nursing Facility | Payer: Medicare Other | Attending: Internal Medicine | Admitting: Internal Medicine

## 2020-10-08 ENCOUNTER — Encounter (HOSPITAL_COMMUNITY): Payer: Self-pay

## 2020-10-08 ENCOUNTER — Emergency Department: Payer: Medicare Other

## 2020-10-08 ENCOUNTER — Other Ambulatory Visit: Payer: Self-pay

## 2020-10-08 ENCOUNTER — Encounter (HOSPITAL_COMMUNITY): Payer: Medicare Other

## 2020-10-08 ENCOUNTER — Other Ambulatory Visit: Payer: Medicare Other | Admitting: Nurse Practitioner

## 2020-10-08 DIAGNOSIS — N170 Acute kidney failure with tubular necrosis: Secondary | ICD-10-CM | POA: Diagnosis not present

## 2020-10-08 DIAGNOSIS — E785 Hyperlipidemia, unspecified: Secondary | ICD-10-CM | POA: Diagnosis present

## 2020-10-08 DIAGNOSIS — K219 Gastro-esophageal reflux disease without esophagitis: Secondary | ICD-10-CM | POA: Diagnosis present

## 2020-10-08 DIAGNOSIS — E871 Hypo-osmolality and hyponatremia: Secondary | ICD-10-CM | POA: Diagnosis present

## 2020-10-08 DIAGNOSIS — Z86010 Personal history of colonic polyps: Secondary | ICD-10-CM

## 2020-10-08 DIAGNOSIS — I480 Paroxysmal atrial fibrillation: Secondary | ICD-10-CM | POA: Diagnosis present

## 2020-10-08 DIAGNOSIS — R001 Bradycardia, unspecified: Secondary | ICD-10-CM

## 2020-10-08 DIAGNOSIS — Z961 Presence of intraocular lens: Secondary | ICD-10-CM | POA: Diagnosis present

## 2020-10-08 DIAGNOSIS — Z888 Allergy status to other drugs, medicaments and biological substances status: Secondary | ICD-10-CM

## 2020-10-08 DIAGNOSIS — I5020 Unspecified systolic (congestive) heart failure: Secondary | ICD-10-CM | POA: Diagnosis not present

## 2020-10-08 DIAGNOSIS — R571 Hypovolemic shock: Secondary | ICD-10-CM | POA: Diagnosis present

## 2020-10-08 DIAGNOSIS — I25118 Atherosclerotic heart disease of native coronary artery with other forms of angina pectoris: Secondary | ICD-10-CM | POA: Diagnosis not present

## 2020-10-08 DIAGNOSIS — E43 Unspecified severe protein-calorie malnutrition: Secondary | ICD-10-CM | POA: Diagnosis present

## 2020-10-08 DIAGNOSIS — F419 Anxiety disorder, unspecified: Secondary | ICD-10-CM | POA: Diagnosis present

## 2020-10-08 DIAGNOSIS — I739 Peripheral vascular disease, unspecified: Secondary | ICD-10-CM | POA: Diagnosis present

## 2020-10-08 DIAGNOSIS — Z79899 Other long term (current) drug therapy: Secondary | ICD-10-CM

## 2020-10-08 DIAGNOSIS — N183 Chronic kidney disease, stage 3 unspecified: Secondary | ICD-10-CM | POA: Diagnosis not present

## 2020-10-08 DIAGNOSIS — Z515 Encounter for palliative care: Secondary | ICD-10-CM

## 2020-10-08 DIAGNOSIS — M810 Age-related osteoporosis without current pathological fracture: Secondary | ICD-10-CM | POA: Diagnosis present

## 2020-10-08 DIAGNOSIS — Z9841 Cataract extraction status, right eye: Secondary | ICD-10-CM

## 2020-10-08 DIAGNOSIS — E860 Dehydration: Secondary | ICD-10-CM | POA: Diagnosis present

## 2020-10-08 DIAGNOSIS — I130 Hypertensive heart and chronic kidney disease with heart failure and stage 1 through stage 4 chronic kidney disease, or unspecified chronic kidney disease: Secondary | ICD-10-CM | POA: Diagnosis present

## 2020-10-08 DIAGNOSIS — D6489 Other specified anemias: Secondary | ICD-10-CM

## 2020-10-08 DIAGNOSIS — K922 Gastrointestinal hemorrhage, unspecified: Secondary | ICD-10-CM | POA: Diagnosis not present

## 2020-10-08 DIAGNOSIS — I5022 Chronic systolic (congestive) heart failure: Secondary | ICD-10-CM | POA: Diagnosis present

## 2020-10-08 DIAGNOSIS — D649 Anemia, unspecified: Secondary | ICD-10-CM | POA: Diagnosis not present

## 2020-10-08 DIAGNOSIS — N179 Acute kidney failure, unspecified: Principal | ICD-10-CM | POA: Diagnosis present

## 2020-10-08 DIAGNOSIS — H9190 Unspecified hearing loss, unspecified ear: Secondary | ICD-10-CM | POA: Diagnosis present

## 2020-10-08 DIAGNOSIS — M109 Gout, unspecified: Secondary | ICD-10-CM | POA: Diagnosis present

## 2020-10-08 DIAGNOSIS — E875 Hyperkalemia: Secondary | ICD-10-CM | POA: Diagnosis present

## 2020-10-08 DIAGNOSIS — Z452 Encounter for adjustment and management of vascular access device: Secondary | ICD-10-CM

## 2020-10-08 DIAGNOSIS — N4829 Other inflammatory disorders of penis: Secondary | ICD-10-CM | POA: Diagnosis not present

## 2020-10-08 DIAGNOSIS — N472 Paraphimosis: Secondary | ICD-10-CM | POA: Diagnosis not present

## 2020-10-08 DIAGNOSIS — A419 Sepsis, unspecified organism: Secondary | ICD-10-CM | POA: Diagnosis not present

## 2020-10-08 DIAGNOSIS — R680 Hypothermia, not associated with low environmental temperature: Secondary | ICD-10-CM | POA: Diagnosis present

## 2020-10-08 DIAGNOSIS — K7460 Unspecified cirrhosis of liver: Secondary | ICD-10-CM | POA: Diagnosis present

## 2020-10-08 DIAGNOSIS — Z95810 Presence of automatic (implantable) cardiac defibrillator: Secondary | ICD-10-CM

## 2020-10-08 DIAGNOSIS — R579 Shock, unspecified: Secondary | ICD-10-CM | POA: Diagnosis not present

## 2020-10-08 DIAGNOSIS — D500 Iron deficiency anemia secondary to blood loss (chronic): Secondary | ICD-10-CM | POA: Diagnosis present

## 2020-10-08 DIAGNOSIS — Z951 Presence of aortocoronary bypass graft: Secondary | ICD-10-CM

## 2020-10-08 DIAGNOSIS — F32A Depression, unspecified: Secondary | ICD-10-CM | POA: Diagnosis present

## 2020-10-08 DIAGNOSIS — F70 Mild intellectual disabilities: Secondary | ICD-10-CM | POA: Diagnosis present

## 2020-10-08 DIAGNOSIS — K921 Melena: Secondary | ICD-10-CM | POA: Diagnosis present

## 2020-10-08 DIAGNOSIS — B370 Candidal stomatitis: Secondary | ICD-10-CM | POA: Diagnosis not present

## 2020-10-08 DIAGNOSIS — I255 Ischemic cardiomyopathy: Secondary | ICD-10-CM | POA: Diagnosis present

## 2020-10-08 DIAGNOSIS — Z8249 Family history of ischemic heart disease and other diseases of the circulatory system: Secondary | ICD-10-CM

## 2020-10-08 DIAGNOSIS — Z20822 Contact with and (suspected) exposure to covid-19: Secondary | ICD-10-CM | POA: Diagnosis present

## 2020-10-08 DIAGNOSIS — Z8711 Personal history of peptic ulcer disease: Secondary | ICD-10-CM

## 2020-10-08 DIAGNOSIS — J811 Chronic pulmonary edema: Secondary | ICD-10-CM

## 2020-10-08 DIAGNOSIS — Z66 Do not resuscitate: Secondary | ICD-10-CM | POA: Diagnosis present

## 2020-10-08 DIAGNOSIS — R627 Adult failure to thrive: Secondary | ICD-10-CM | POA: Diagnosis present

## 2020-10-08 DIAGNOSIS — G9341 Metabolic encephalopathy: Principal | ICD-10-CM | POA: Diagnosis present

## 2020-10-08 DIAGNOSIS — I252 Old myocardial infarction: Secondary | ICD-10-CM

## 2020-10-08 DIAGNOSIS — I959 Hypotension, unspecified: Secondary | ICD-10-CM | POA: Diagnosis not present

## 2020-10-08 DIAGNOSIS — I2510 Atherosclerotic heart disease of native coronary artery without angina pectoris: Secondary | ICD-10-CM | POA: Diagnosis present

## 2020-10-08 DIAGNOSIS — N1832 Chronic kidney disease, stage 3b: Secondary | ICD-10-CM | POA: Diagnosis present

## 2020-10-08 DIAGNOSIS — Z7189 Other specified counseling: Secondary | ICD-10-CM | POA: Diagnosis not present

## 2020-10-08 DIAGNOSIS — Z6820 Body mass index (BMI) 20.0-20.9, adult: Secondary | ICD-10-CM

## 2020-10-08 DIAGNOSIS — N4889 Other specified disorders of penis: Secondary | ICD-10-CM | POA: Diagnosis not present

## 2020-10-08 DIAGNOSIS — I10 Essential (primary) hypertension: Secondary | ICD-10-CM | POA: Diagnosis not present

## 2020-10-08 DIAGNOSIS — I081 Rheumatic disorders of both mitral and tricuspid valves: Secondary | ICD-10-CM | POA: Diagnosis present

## 2020-10-08 DIAGNOSIS — E861 Hypovolemia: Secondary | ICD-10-CM | POA: Diagnosis present

## 2020-10-08 DIAGNOSIS — Z87891 Personal history of nicotine dependence: Secondary | ICD-10-CM

## 2020-10-08 DIAGNOSIS — Z7901 Long term (current) use of anticoagulants: Secondary | ICD-10-CM

## 2020-10-08 LAB — PROTIME-INR
INR: 1.6 — ABNORMAL HIGH (ref 0.8–1.2)
Prothrombin Time: 18.5 seconds — ABNORMAL HIGH (ref 11.4–15.2)

## 2020-10-08 LAB — TROPONIN I (HIGH SENSITIVITY): Troponin I (High Sensitivity): 35 ng/L — ABNORMAL HIGH (ref <18)

## 2020-10-08 LAB — COMPREHENSIVE METABOLIC PANEL
ALT: 19 U/L (ref 0–44)
AST: 27 U/L (ref 15–41)
Albumin: 2.7 g/dL — ABNORMAL LOW (ref 3.5–5.0)
Alkaline Phosphatase: 129 U/L — ABNORMAL HIGH (ref 38–126)
Anion gap: 12 (ref 5–15)
BUN: 106 mg/dL — ABNORMAL HIGH (ref 8–23)
CO2: 21 mmol/L — ABNORMAL LOW (ref 22–32)
Calcium: 8.1 mg/dL — ABNORMAL LOW (ref 8.9–10.3)
Chloride: 89 mmol/L — ABNORMAL LOW (ref 98–111)
Creatinine, Ser: 4.9 mg/dL — ABNORMAL HIGH (ref 0.61–1.24)
GFR, Estimated: 12 mL/min — ABNORMAL LOW (ref >60)
Glucose, Bld: 90 mg/dL (ref 70–99)
Potassium: 6 mmol/L — ABNORMAL HIGH (ref 3.5–5.1)
Sodium: 122 mmol/L — ABNORMAL LOW (ref 135–145)
Total Bilirubin: 0.8 mg/dL (ref 0.3–1.2)
Total Protein: 5.9 g/dL — ABNORMAL LOW (ref 6.5–8.1)

## 2020-10-08 LAB — BASIC METABOLIC PANEL
Anion gap: 11 (ref 5–15)
Anion gap: 11 (ref 5–15)
BUN: 102 mg/dL — ABNORMAL HIGH (ref 8–23)
BUN: 98 mg/dL — ABNORMAL HIGH (ref 8–23)
CO2: 22 mmol/L (ref 22–32)
CO2: 22 mmol/L (ref 22–32)
Calcium: 8.1 mg/dL — ABNORMAL LOW (ref 8.9–10.3)
Calcium: 8.7 mg/dL — ABNORMAL LOW (ref 8.9–10.3)
Chloride: 91 mmol/L — ABNORMAL LOW (ref 98–111)
Chloride: 91 mmol/L — ABNORMAL LOW (ref 98–111)
Creatinine, Ser: 4.73 mg/dL — ABNORMAL HIGH (ref 0.61–1.24)
Creatinine, Ser: 4.7 mg/dL — ABNORMAL HIGH (ref 0.61–1.24)
GFR, Estimated: 13 mL/min — ABNORMAL LOW (ref >60)
GFR, Estimated: 13 mL/min — ABNORMAL LOW (ref >60)
Glucose, Bld: 88 mg/dL (ref 70–99)
Glucose, Bld: 119 mg/dL — ABNORMAL HIGH (ref 70–99)
Potassium: 5.8 mmol/L — ABNORMAL HIGH (ref 3.5–5.1)
Potassium: 6.2 mmol/L — ABNORMAL HIGH (ref 3.5–5.1)
Sodium: 124 mmol/L — ABNORMAL LOW (ref 135–145)
Sodium: 124 mmol/L — ABNORMAL LOW (ref 135–145)

## 2020-10-08 LAB — LACTIC ACID, PLASMA: Lactic Acid, Venous: 1 mmol/L (ref 0.5–1.9)

## 2020-10-08 LAB — CBC
HCT: 33 % — ABNORMAL LOW (ref 39.0–52.0)
Hemoglobin: 10.6 g/dL — ABNORMAL LOW (ref 13.0–17.0)
MCH: 31.1 pg (ref 26.0–34.0)
MCHC: 32.1 g/dL (ref 30.0–36.0)
MCV: 96.8 fL (ref 80.0–100.0)
Platelets: 386 10*3/uL (ref 150–400)
RBC: 3.41 MIL/uL — ABNORMAL LOW (ref 4.22–5.81)
RDW: 19.4 % — ABNORMAL HIGH (ref 11.5–15.5)
WBC: 7 10*3/uL (ref 4.0–10.5)
nRBC: 0 % (ref 0.0–0.2)

## 2020-10-08 LAB — CBC WITH DIFFERENTIAL/PLATELET
Abs Immature Granulocytes: 0.06 10*3/uL (ref 0.00–0.07)
Basophils Absolute: 0 10*3/uL (ref 0.0–0.1)
Basophils Relative: 0 %
Eosinophils Absolute: 0.1 10*3/uL (ref 0.0–0.5)
Eosinophils Relative: 2 %
HCT: 21.3 % — ABNORMAL LOW (ref 39.0–52.0)
Hemoglobin: 6.6 g/dL — ABNORMAL LOW (ref 13.0–17.0)
Immature Granulocytes: 1 %
Lymphocytes Relative: 10 %
Lymphs Abs: 0.6 10*3/uL — ABNORMAL LOW (ref 0.7–4.0)
MCH: 31.9 pg (ref 26.0–34.0)
MCHC: 31 g/dL (ref 30.0–36.0)
MCV: 102.9 fL — ABNORMAL HIGH (ref 80.0–100.0)
Monocytes Absolute: 0.7 10*3/uL (ref 0.1–1.0)
Monocytes Relative: 12 %
Neutro Abs: 4.4 10*3/uL (ref 1.7–7.7)
Neutrophils Relative %: 75 %
Platelets: 356 10*3/uL (ref 150–400)
RBC: 2.07 MIL/uL — ABNORMAL LOW (ref 4.22–5.81)
RDW: 18 % — ABNORMAL HIGH (ref 11.5–15.5)
WBC: 6 10*3/uL (ref 4.0–10.5)
nRBC: 0 % (ref 0.0–0.2)

## 2020-10-08 LAB — RESP PANEL BY RT-PCR (FLU A&B, COVID) ARPGX2
Influenza A by PCR: NEGATIVE
Influenza B by PCR: NEGATIVE
SARS Coronavirus 2 by RT PCR: NEGATIVE

## 2020-10-08 LAB — PREPARE RBC (CROSSMATCH)

## 2020-10-08 LAB — APTT: aPTT: 48 seconds — ABNORMAL HIGH (ref 24–36)

## 2020-10-08 LAB — BRAIN NATRIURETIC PEPTIDE: B Natriuretic Peptide: 564.4 pg/mL — ABNORMAL HIGH (ref 0.0–100.0)

## 2020-10-08 LAB — CBG MONITORING, ED: Glucose-Capillary: 87 mg/dL (ref 70–99)

## 2020-10-08 LAB — MRSA PCR SCREENING: MRSA by PCR: NEGATIVE

## 2020-10-08 LAB — GLUCOSE, CAPILLARY: Glucose-Capillary: 91 mg/dL (ref 70–99)

## 2020-10-08 MED ORDER — CALCIUM GLUCONATE 10 % IV SOLN
1.0000 g | Freq: Once | INTRAVENOUS | Status: AC
  Administered 2020-10-08: 1 g via INTRAVENOUS
  Filled 2020-10-08: qty 10

## 2020-10-08 MED ORDER — INSULIN REGULAR HUMAN 100 UNIT/ML IJ SOLN
10.0000 [IU] | Freq: Once | INTRAMUSCULAR | Status: AC
  Administered 2020-10-08: 10 [IU] via INTRAVENOUS
  Filled 2020-10-08: qty 10

## 2020-10-08 MED ORDER — MIDODRINE HCL 5 MG PO TABS
5.0000 mg | ORAL_TABLET | Freq: Three times a day (TID) | ORAL | Status: DC
  Administered 2020-10-08 – 2020-10-09 (×2): 5 mg via ORAL
  Filled 2020-10-08 (×4): qty 1

## 2020-10-08 MED ORDER — NOREPINEPHRINE 4 MG/250ML-% IV SOLN: 0.0000 ug/min | INTRAVENOUS | Status: DC

## 2020-10-08 MED ORDER — SODIUM CHLORIDE 0.9 % IV SOLN
INTRAVENOUS | Status: DC
  Administered 2020-10-11: 100 mL/h via INTRAVENOUS

## 2020-10-08 MED ORDER — HYDROCODONE-ACETAMINOPHEN 5-325 MG PO TABS
1.0000 | ORAL_TABLET | Freq: Four times a day (QID) | ORAL | Status: DC | PRN
  Administered 2020-10-08: 2 via ORAL
  Administered 2020-10-13: 1 via ORAL
  Filled 2020-10-08: qty 2
  Filled 2020-10-08: qty 1

## 2020-10-08 MED ORDER — SODIUM BICARBONATE 8.4 % IV SOLN
50.0000 meq | Freq: Once | INTRAVENOUS | Status: AC
  Administered 2020-10-08: 50 meq via INTRAVENOUS
  Filled 2020-10-08: qty 50

## 2020-10-08 MED ORDER — AMIODARONE HCL 200 MG PO TABS
200.0000 mg | ORAL_TABLET | Freq: Every day | ORAL | Status: DC
  Administered 2020-10-08 – 2020-10-16 (×9): 200 mg via ORAL
  Filled 2020-10-08 (×10): qty 1

## 2020-10-08 MED ORDER — INSULIN ASPART 100 UNIT/ML ~~LOC~~ SOLN
5.0000 [IU] | Freq: Once | SUBCUTANEOUS | Status: AC
  Administered 2020-10-08: 5 [IU] via INTRAVENOUS
  Filled 2020-10-08: qty 1

## 2020-10-08 MED ORDER — DEXTROSE 50 % IV SOLN
1.0000 | Freq: Once | INTRAVENOUS | Status: AC
  Administered 2020-10-08: 50 mL via INTRAVENOUS

## 2020-10-08 MED ORDER — MORPHINE SULFATE (PF) 2 MG/ML IV SOLN
1.0000 mg | INTRAVENOUS | Status: DC | PRN
  Administered 2020-10-13: 1 mg via INTRAVENOUS
  Filled 2020-10-08: qty 1

## 2020-10-08 MED ORDER — ONDANSETRON HCL 4 MG/2ML IJ SOLN: 4.0000 mg | Freq: Four times a day (QID) | INTRAMUSCULAR | Status: DC | PRN

## 2020-10-08 MED ORDER — NOREPINEPHRINE 4 MG/250ML-% IV SOLN: 2.0000 ug/min | INTRAVENOUS | Status: DC

## 2020-10-08 MED ORDER — SODIUM CHLORIDE 0.9 % IV SOLN
2.0000 g | Freq: Once | INTRAVENOUS | Status: AC
  Administered 2020-10-08: 2 g via INTRAVENOUS
  Filled 2020-10-08: qty 2

## 2020-10-08 MED ORDER — NOREPINEPHRINE 4 MG/250ML-% IV SOLN
2.0000 ug/min | INTRAVENOUS | Status: DC
  Administered 2020-10-08: 8 ug/min via INTRAVENOUS
  Administered 2020-10-08 – 2020-10-09 (×3): 10 ug/min via INTRAVENOUS
  Administered 2020-10-09: 9 ug/min via INTRAVENOUS
  Administered 2020-10-10: 6 ug/min via INTRAVENOUS
  Administered 2020-10-10: 5 ug/min via INTRAVENOUS
  Administered 2020-10-11: 6 ug/min via INTRAVENOUS
  Administered 2020-10-11: 8 ug/min via INTRAVENOUS
  Filled 2020-10-08 (×10): qty 250

## 2020-10-08 MED ORDER — SODIUM CHLORIDE 0.9 % IV SOLN
10.0000 mL/h | Freq: Once | INTRAVENOUS | Status: AC
  Administered 2020-10-08: 10 mL/h via INTRAVENOUS

## 2020-10-08 MED ORDER — LACTATED RINGERS IV SOLN: INTRAVENOUS | Status: DC

## 2020-10-08 MED ORDER — SODIUM CHLORIDE 0.9 % IV BOLUS: 1000.0000 mL | Freq: Once | INTRAVENOUS | Status: DC

## 2020-10-08 MED ORDER — SODIUM CHLORIDE 0.9 % IV SOLN
250.0000 mL | INTRAVENOUS | Status: DC
  Administered 2020-10-08: 250 mL via INTRAVENOUS

## 2020-10-08 MED ORDER — PANTOPRAZOLE SODIUM 40 MG PO TBEC
40.0000 mg | DELAYED_RELEASE_TABLET | Freq: Every day | ORAL | Status: DC
Start: 2020-10-08 — End: 2020-10-16
  Administered 2020-10-08 – 2020-10-16 (×9): 40 mg via ORAL
  Filled 2020-10-08 (×10): qty 1

## 2020-10-08 MED ORDER — NOREPINEPHRINE 4 MG/250ML-% IV SOLN
INTRAVENOUS | Status: AC
  Administered 2020-10-08: 2 ug/min via INTRAVENOUS
  Filled 2020-10-08: qty 250

## 2020-10-08 MED ORDER — ATORVASTATIN CALCIUM 20 MG PO TABS
40.0000 mg | ORAL_TABLET | Freq: Every day | ORAL | Status: DC
  Administered 2020-10-08 – 2020-10-16 (×9): 40 mg via ORAL
  Filled 2020-10-08 (×10): qty 2

## 2020-10-08 MED ORDER — LACTATED RINGERS IV BOLUS
500.0000 mL | Freq: Once | INTRAVENOUS | Status: AC
  Administered 2020-10-08: 500 mL via INTRAVENOUS

## 2020-10-08 MED ORDER — TRAZODONE HCL 50 MG PO TABS
50.0000 mg | ORAL_TABLET | Freq: Every evening | ORAL | Status: DC | PRN
  Administered 2020-10-09 – 2020-10-10 (×3): 50 mg via ORAL
  Filled 2020-10-08 (×5): qty 1

## 2020-10-08 MED ORDER — PANTOPRAZOLE SODIUM 40 MG IV SOLR
40.0000 mg | Freq: Once | INTRAVENOUS | Status: AC
  Administered 2020-10-08: 40 mg via INTRAVENOUS
  Filled 2020-10-08: qty 40

## 2020-10-08 MED ORDER — SODIUM ZIRCONIUM CYCLOSILICATE 10 G PO PACK
10.0000 g | PACK | Freq: Two times a day (BID) | ORAL | Status: AC
  Administered 2020-10-08 – 2020-10-09 (×2): 10 g via ORAL
  Filled 2020-10-08: qty 1
  Filled 2020-10-08: qty 2

## 2020-10-08 MED ORDER — DEXTROSE 50 % IV SOLN
1.0000 | Freq: Once | INTRAVENOUS | Status: AC
  Administered 2020-10-08: 50 mL via INTRAVENOUS
  Filled 2020-10-08: qty 50

## 2020-10-08 MED ORDER — DOCUSATE SODIUM 100 MG PO CAPS
100.0000 mg | ORAL_CAPSULE | Freq: Two times a day (BID) | ORAL | Status: DC | PRN
  Administered 2020-10-08: 100 mg via ORAL
  Filled 2020-10-08 (×2): qty 1

## 2020-10-08 MED ORDER — CALCIUM GLUCONATE-NACL 1-0.675 GM/50ML-% IV SOLN
1.0000 g | Freq: Once | INTRAVENOUS | Status: AC
  Administered 2020-10-08: 1000 mg via INTRAVENOUS
  Filled 2020-10-08: qty 50

## 2020-10-08 MED ORDER — POLYETHYLENE GLYCOL 3350 17 G PO PACK
17.0000 g | PACK | Freq: Every day | ORAL | Status: DC | PRN
  Filled 2020-10-08: qty 1

## 2020-10-08 MED ORDER — ORAL CARE MOUTH RINSE
15.0000 mL | Freq: Two times a day (BID) | OROMUCOSAL | Status: DC
  Administered 2020-10-08 – 2020-10-16 (×13): 15 mL via OROMUCOSAL

## 2020-10-08 MED ORDER — LACTATED RINGERS IV BOLUS (SEPSIS)
1000.0000 mL | Freq: Once | INTRAVENOUS | Status: AC
  Administered 2020-10-08: 1000 mL via INTRAVENOUS

## 2020-10-08 MED ORDER — CHLORHEXIDINE GLUCONATE CLOTH 2 % EX PADS
6.0000 | MEDICATED_PAD | Freq: Every day | CUTANEOUS | Status: DC
  Administered 2020-10-08 – 2020-10-16 (×9): 6 via TOPICAL

## 2020-10-08 NOTE — H&P (Signed)
NAME:  Carl [name], MRN:  [id number], DOB:  11-03-1950, LOS: 0 ADMISSION DATE:  10/08/2020, CONSULTATION DATE:  2/14 REFERRING MD:  Jacqualine Code, CHIEF COMPLAINT:  weakness   Brief History:  70 y/o male with extensive past medical history presented to the [facility] ED on 10/09/19 with a chief complaint of generalized weakness and change in mental status.  He was noted to be in shock, bradycardic, hypothermic, profoundly anemic and an acute kidney injury.  Critical care was consulted for further evaluation as he was requiring vasopressors.  The underlying cause of his shock was felt to be hypovolemia from diuretic use, poor p.o. intake, and possibly a slow chronic GI bleed.  History of Present Illness:  70 y/o male with extensive past medical history presented to the [facility] ED on 10/09/19 with a chief complaint of generalized weakness and change in mental status.  He was noted to be in shock, bradycardic, hypothermic, profoundly anemic and an acute kidney injury.  Critical care was consulted for further evaluation as he was requiring vasopressors.  The underlying cause of his shock was felt to be hypovolemia from diuretic use, poor p.o. intake, and possibly a slow chronic GI bleed. I saw and examined the patient independently, he was quite encephalopathic and unable to provide history so history was obtained from talking to the patient's daughter as well as Dr. Georgiana Spinner.  The patient's daughter provides history and she says that he has been ill since Tuesday a week ago.  Apparently his ICD signal indicated to the advanced heart failure clinic that he needed more aggressive diuresis so torsemide dosing was adjusted.  After this he experienced significant polyuria and loss of urinary continence.  Around this time he became more confused, stopped eating meals completely and was generally weak.  His daughter says that by the end of the week home health nurse who checks on him facilitated conversation with his primary care  physician and the patient was diagnosed with a urinary tract infection and started on an antibiotic.  Over the weekend his weakness continued, he was unable to carry on any normal conversation and his p.o. intake was poor.  Overnight from February 13 through the morning of February 14 he developed worsening fatigue, was unable to get out of bed, and was unable to hold up a glass of water to take his medications.  At this point his daughter called 911.  Upon arrival they found him to be hypothermic, bradycardic with a heart rate in the 41Y and a systolic blood pressure in the 50s.  He was brought to the [facility] emergency room where he was given IV fluid resuscitation, started on peripheral norepinephrine and transfused a unit of blood. The patient's daughter says that he has been living with her for the last 6 weeks.  He had a fall and ever since then he has been feeling worse.  She says that he is always lived a very independent life so she was surprised when he was willing to come home and stay with her.  She says that his weakness has been the main reason for him staying at home and she has not seen him improved to the point where he can go back and live independently again.  Past Medical History:  Chronic systolic heart failure, LVEF 20 to 25%, followed by Dr. Algernon Huxley.  Has ICD in place Anxiety Osteoarthritis Chronic kidney disease Cirrhosis of the liver Atrial fibrillation External hemorrhoids GERD History of GI bleeding felt  to be possibly related to AVM Status post mitral valve repair Hyperlipidemia Intellectual disability History of coronary artery disease status post MI Peripheral vascular disease Tubular adenoma of the colon Schatzki's ring Peptic ulcer disease Psoriasis Vitiligo  Significant Hospital Events:  2/14 admission  Consults:  PCCM Nephrology Cardiology GI  Procedures:  none  Significant Diagnostic Tests:    Micro Data:  February 14  SARS-CoV-2 > negative, influenza negative February 14 blood culture >   Antimicrobials:    Interim History / Subjective:  As above  Objective   Blood pressure (!) 85/52, pulse (!) 44, temperature 98.1 F (36.7 C), temperature source Axillary, resp. rate 16, height 5\' 10"  (1.778 m), weight 70 kg, SpO2 98 %.        Intake/Output Summary (Last 24 hours) at 10/08/2020 1506 Last data filed at 10/08/2020 1440 Gross per 24 hour  Intake 3060 ml  Output --  Net 3060 ml   Filed Weights   10/08/20 1233  Weight: 70 kg    Examination: General:  Frail, elderly male resting comfortably in bed HENT: NCAT OP clear, mucus membranes dry PULM: CTA B, normal effort CV: RRR, diastolic murmur GI: BS+, soft, nontender MSK: normal bulk and tone Neuro: will open eyes to voice, answers yes/no, but doesn't follow commands or hold conversation, moves all four extremities well   Resolved Hospital Problem list     Assessment & Plan:  Hypovolemic and hemorrhagic shock: Not actively bleeding, complicated by cardiomyopathy and chronic systolic heart failure Continue Levophed as needed for mean arterial pressure greater than 65 Continue volume resuscitation with packed red blood cells as ordered Lactated Ringer's at 75 cc an hour  Junctional bradycardia due to hyperkalemia and hypothermia: Chronic systolic heart failure Telemetry monitoring Correct hyperkalemia Monitor hemodynamics on Levophed Follow-up cardiology recommendations Hold home Coreg and amiodarone  Hemorrhagic anemia: Monitor for bleeding Transfuse PRBC for Hgb < 7 gm/dL  Chronic GI bleed, no evidence of acute GI bleeding: Continue home pantoprazole Hold anticoagulants SCDs for DVT prophylaxis  Acute kidney injury due to hypovolemia: Does not appear to be a good hemodialysis candidate Follow-up nephrology recommendations Monitor urine output Repeat basic metabolic panel later this evening  Cirrhosis without evidence of  ascites Hold diuretics (metolazone and torsemide) Monitor liver function as needed  Acute metabolic encephalopathy due to hypovolemia, shock, anemia Hold home gabapentin Frequent orientation Lights off at night, on during the daytime Minimize sedating medicines  Gout: No evidence of flareup Hold allopurinol  Also present on admission Frailty Failure to thrive Physical deconditoining     Best practice (evaluated daily)  Diet: NPO Pain/Anxiety/Delirium protocol (if indicated): n/a VAP protocol (if indicated): n/a DVT prophylaxis: SCD GI prophylaxis: Oral ppi Glucose control: monitor Mobility: bed rest Disposition:admit to ICU  Goals of Care:  Last date of multidisciplinary goals of care discussion: 2/14 Family and staff present: [place], daughter Barnett Applebaum Summary of discussion: she feels that he is tired and would not want prolonged aggressive care.  He has never given her clear direction about hemodialysis but when I briefly described it she said that she didn't think he would want it.   Follow up goals of care discussion due: 2/21 Code Status: DNR  Labs   CBC: Recent Labs  Lab 10/08/20 1244  WBC 6.0  NEUTROABS 4.4  HGB 6.6*  HCT 21.3*  MCV 102.9*  PLT 101    Basic Metabolic Panel: Recent Labs  Lab 10/08/20 1244  NA 122*  K 6.0*  CL 89*  CO2 21*  GLUCOSE 90  BUN 106*  CREATININE 4.90*  CALCIUM 8.1*   GFR: Estimated Creatinine Clearance: 14.1 mL/min (A) (by C-G formula based on SCr of 4.9 mg/dL (H)). Recent Labs  Lab 10/08/20 1234 10/08/20 1244  WBC  --  6.0  LATICACIDVEN 1.0  --     Liver Function Tests: Recent Labs  Lab 10/08/20 1244  AST 27  ALT 19  ALKPHOS 129*  BILITOT 0.8  PROT 5.9*  ALBUMIN 2.7*   No results for input(s): LIPASE, AMYLASE in the last 168 hours. No results for input(s): AMMONIA in the last 168 hours.  ABG    Component Value Date/Time   PHART 7.437 12/27/2018 0957   PCO2ART 35.5 12/27/2018 0957   PO2ART 99.6  12/27/2018 0957   HCO3 23.6 12/27/2018 0957   TCO2 34 (H) 11/09/2019 0924   ACIDBASEDEF 0.1 12/27/2018 0957   O2SAT 97.4 12/27/2018 0957     Coagulation Profile: Recent Labs  Lab 10/08/20 1244  INR 1.6*    Cardiac Enzymes: No results for input(s): CKTOTAL, CKMB, CKMBINDEX, TROPONINI in the last 168 hours.  HbA1C: Hgb A1c MFr Bld  Date/Time Value Ref Range Status  12/27/2018 09:58 AM 5.9 (H) 4.8 - 5.6 % Final    Comment:    (NOTE) Pre diabetes:          5.7%-6.4% Diabetes:              >6.4% Glycemic control for   <7.0% adults with diabetes   06/11/2017 05:40 PM 5.3 4.8 - 5.6 % Final    Comment:    (NOTE) Pre diabetes:          5.7%-6.4% Diabetes:              >6.4% Glycemic control for   <7.0% adults with diabetes     CBG: No results for input(s): GLUCAP in the last 168 hours.  Review of Systems:   Cannot obtain due to change in mental status  Past Medical History:  He,  has a past medical history of AICD (automatic cardioverter/defibrillator) present, Anemia, Anxiety, Arthritis, Brunner's gland hyperplasia of duodenum, CHF (congestive heart failure) ([place]), Chronic kidney disease, Cirrhosis of liver ([place]), Coronary artery disease, Depression, Dysrhythmia, External hemorrhoids, Fracture of femoral neck, left ([place]) (07/21/2018), GERD (gastroesophageal reflux disease), Gout, Hearing loss, Heart murmur, Hepatic failure ([place]), HH (hiatus hernia), Hyperlipidemia, Hypokalemia, Hyposmolality, Intellectual disability, Internal hemorrhoids, Ischemic cardiomyopathy, Leg fracture, left, Myocardial infarction ([place]), Osteoporosis, Paronychia, Peripheral vascular disease ([place]), Pneumonia, Psoriasis, Psoriasis, PUD (peptic ulcer disease), Schatzki's ring, Tubular adenoma of colon, and Vitiligo.   Surgical History:   Past Surgical History:  Procedure Laterality Date  . ATRIAL SEPTAL DEFECT(ASD) CLOSURE N/A 12/30/2018   Procedure: ATRIAL SEPTAL DEFECT(ASD) CLOSURE;  Surgeon: [name], MD;  Location: [place] CV LAB;  Service: Cardiovascular;  Laterality: N/A;  . CARDIAC SURGERY     ICD  . CARDIOVERSION N/A 11/09/2019   Procedure: CARDIOVERSION;  Surgeon: [name], MD;  Location: [facility] ENDOSCOPY;  Service: Cardiovascular;  Laterality: N/A;  . CARDIOVERSION N/A 12/21/2019   Procedure: CARDIOVERSION;  Surgeon: [name], MD;  Location: [place];  Service: Cardiovascular;  Laterality: N/A;  . CATARACT EXTRACTION W/PHACO Right 07/26/2020   Procedure: CATARACT EXTRACTION PHACO AND INTRAOCULAR LENS PLACEMENT ([place]) RIGHT VISION BLUE;  Surgeon: Birder Robson, MD;  Location: [facility] ORS;  Service: Ophthalmology;  Laterality: Right;  [place] 01:14.1 CDE 13.65 Fluid Pack Lot # [id number]  . COLONOSCOPY WITH ESOPHAGOGASTRODUODENOSCOPY (  EGD)    . COLONOSCOPY WITH PROPOFOL N/A 08/24/2017   Procedure: COLONOSCOPY WITH PROPOFOL;  Surgeon: [name], MD;  Location: [facility] ENDOSCOPY;  Service: Endoscopy;  Laterality: N/A;  . COLONOSCOPY WITH PROPOFOL N/A 07/23/2020   Procedure: COLONOSCOPY WITH PROPOFOL;  Surgeon: [place], [name], MD;  Location: [facility] ENDOSCOPY;  Service: Gastroenterology;  Laterality: N/A;  . CORONARY ARTERY BYPASS GRAFT N/A 08/03/2014   Procedure: CORONARY ARTERY BYPASS GRAFTING (CABG);  Surgeon: [name], MD;  Location: [place];  Service: Open Heart Surgery;  Laterality: N/A;  . ENTEROSCOPY N/A 08/06/2020   Procedure: ENTEROSCOPY;  Surgeon: Jonathon Bellows, MD;  Location[facility] END[facility]COPY;  Service: Gastroenterology;  Laterality: N/A;  . EP IMPLANTABLE DEVICE N/A 05/01/2015   MDT ICD implanted for primary prevention of sudden death  . ESOPHAGOGASTRODUODENOSCOPY N/A 06/28/2020   Procedure: ESOPHAGOGASTRODUODENOSCOPY (EGD);  Surgeon: Clarene Essex, MD;  Location: [place];  Service: Endoscopy;  Laterality: N/A;  . ESOPHAGOGASTRODUODENOSCOPY (EGD) WITH PROPOFOL N/A 04/16/2015   Procedure: ESOPHAGOGASTRODUODENOSCOPY (EGD) WITH PROPOFOL;  Surgeon:  [name], MD;  Location: [facility] ENDOSCOPY;  Service: Endoscopy;  Laterality: N/A;  . ESOPHAGOGASTRODUODENOSCOPY (EGD) WITH PROPOFOL N/A 08/24/2017   Procedure: ESOPHAGOGASTRODUODENOSCOPY (EGD) WITH PROPOFOL;  Surgeon: [name], MD;  Location: [facility] ENDOSCOPY;  Service: Endoscopy;  Laterality: N/A;  . FRACTURE SURGERY Left    ORIF lower leg  . GIVENS CAPSULE STUDY N/A 08/03/2020   Procedure: GIVENS CAPSULE STUDY;  Surgeon: Jonathon Bellows, MD;  Location: [facility] ENDOSCOPY;  Service: Gastroenterology;  Laterality: N/A;  . HERNIA REPAIR    . HIP PINNING,CANNULATED Left 07/22/2018   Procedure: CANNULATED HIP PINNING;  Surgeon: [name], MD;  Location: [place];  Service: Orthopedics;  Laterality: Left;  . MITRAL VALVE REPAIR N/A 12/30/2018   Procedure: MITRAL VALVE REPAIR;  Surgeon: [name], MD;  Location: [place] CV LAB;  Service: Cardiovascular;  Laterality: N/A;  . RIGHT/LEFT HEART CATH AND CORONARY/GRAFT ANGIOGRAPHY N/A 12/21/2018   Procedure: RIGHT/LEFT HEART CATH AND CORONARY/GRAFT ANGIOGRAPHY;  Surgeon: [name], MD;  Location: [place] [facility] LAB;  Service: Cardiovascular;  Laterality: N/A;  . TEE WITHOUT CARDIOVERSION N/A 08/03/2014   Procedure: TRANSESOPHAGEAL ECHOCARDIOGRAM (TEE);  Surgeon: [name], MD;  Location: [place];  Service: Open Heart Surgery;  Laterality: N/A;  . TEE WITHOUT CARDIOVERSION N/A 02/10/2018   Procedure: TRANSESOPHAGEAL ECHOCARDIOGRAM (TEE);  Surgeon: [name], MD;  Location: [facility] ENDOSCOPY;  Service: Cardiovascular;  Laterality: N/A;  . TEE WITHOUT CARDIOVERSION N/A 11/09/2019   Procedure: TRANSESOPHAGEAL ECHOCARDIOGRAM (TEE);  Surgeon: [name], MD;  Location: [facility] ENDOSCOPY;  Service: Cardiovascular;  Laterality: N/A;     Social History:   reports that he quit smoking about 8 years ago. He has never used smokeless tobacco. He reports that he does not drink alcohol and does not use drugs.   Family History:  His family  history includes CAD in his father; Diabetes in his brother; Heart Problems in his brother; Valvular heart disease in his mother. There is no history of Prostate cancer, Bladder Cancer, or Kidney cancer.   Allergies Allergies  Allergen Reactions  . Spironolactone Other (See Comments)    gynecomastia      Home Medications  Prior to Admission medications   Medication Sig Start Date End Date Taking? Authorizing Provider  acetaminophen (TYLENOL) 500 MG tablet Take 500-1,000 mg by mouth every 6 (six) hours as needed (for pain.).    [provider]  allopurinol (ZYLOPRIM) 100 MG tablet  Take 100 mg by mouth daily.  02/08/19   [provider]  amiodarone (PACERONE) 200 MG tablet Take 1 tablet (200 mg total) by mouth daily. 04/13/20   Bensimhon, Shaune Pascal, MD  apixaban (ELIQUIS) 2.5 MG TABS tablet Take 1 tablet (2.5 mg total) by mouth 2 (two) times daily. Patient taking differently: Take 2.5 mg by mouth 2 (two) times daily. 1/2 tablet twice daily 08/20/20   [name], MD  Ascorbic Acid (VITAMIN C) 100 MG tablet Take 100 mg by mouth daily.    [provider]  atorvastatin (LIPITOR) 40 MG tablet TAKE 1 TABLET (40 MG TOTAL) BY MOUTH DAILY AT 6 PM. 08/27/20   [name], MD  carvedilol (COREG) 3.125 MG tablet TAKE 1 TABLET (3.125 MG TOTAL) BY MOUTH 2 (TWO) TIMES DAILY. 10/01/20   [name], MD  Cholecalciferol 50 MCG (2000 UT) TABS Take 2,000 Units by mouth at bedtime.     [provider]  Cyanocobalamin (VITAMIN B 12) 500 MCG TABS Take by mouth.    [provider]  cyanocobalamin 500 MCG tablet Take 500 mcg by mouth daily.  Patient not taking: Reported on 09/19/2020    [provider]  docusate sodium (COLACE) 100 MG capsule Take 100 mg by mouth daily as needed for mild constipation.    [provider]  DULoxetine (CYMBALTA) 30 MG capsule Take 30 mg by mouth at bedtime.     [provider]  ferrous sulfate 325 (65 FE)  MG tablet Take 1 tablet (325 mg total) by mouth daily with breakfast. 02/09/18   [name], MD  gabapentin (NEURONTIN) 300 MG capsule Take 300 mg by mouth daily. 08/23/20   [provider]  ketoconazole (NIZORAL) 2 % shampoo Apply 1 application topically 2 (two) times a week. Mon and Thu 09/28/18   [provider]  lactulose (CHRONULAC) 10 GM/15ML solution Take 30 mLs (20 g total) by mouth daily. 07/05/20   Gaylan Gerold, DO  metolazone (ZAROXOLYN) 2.5 MG tablet Take 1 tablet twice a week, every Monday and Thursday. Take 20 mEq Potassium with Metolazone. 07/30/20   [name], MD  Multiple Vitamin (MULTIVITAMIN) tablet Take 1 tablet by mouth at bedtime.     [provider]  Nutritional Supplements (ENSURE COMPLETE SHAKE) LIQD Take 1 Can by mouth 3 (three) times daily. Between meals 08/27/20   Lyda Jester M, PA-C  pantoprazole (PROTONIX) 40 MG tablet TAKE 1 TABLET BY MOUTH EVERY DAY 08/06/20   [name], MD  potassium chloride SA (KLOR-CON M20) 20 MEQ tablet Take 1 tablet (20 mEq total) by mouth daily. 06/06/20   [name], MD  sucralfate (CARAFATE) 1 g tablet Take 1 tablet (1 g total) by mouth 2 (two) times daily. 07/05/20   Gaylan Gerold, DO  torsemide (DEMADEX) 20 MG tablet Take 4 tablets (80 mg total) by mouth every morning AND 2 tablets (40 mg total) every evening. 10/03/20   [name], MD     Critical care time: 40 minutes    Roselie Awkward, MD [place] Pager: [phone] Cell: [phone] If no response, call [phone]

## 2020-10-08 NOTE — ED Triage Notes (Signed)
Per ems , fluid on pacemaker, hypotensive, blood in stool

## 2020-10-08 NOTE — ED Notes (Signed)
Report given to emily icu rn all questions answered

## 2020-10-08 NOTE — Progress Notes (Signed)
Following for code sepsis 

## 2020-10-08 NOTE — Consult Note (Addendum)
Cardiology Consultation:   Patient ID: Carl [name] MRN: [id number]; DOB: 10/21/1950  Admit date: 10/08/2020 Date of Consult: 10/08/2020  PCP:  [name], [place]  Cardiologist:  No primary care provider on file.  Advanced Practice Provider:  No care team member to display Electrophysiologist:  None  :[id number]}  Patient Profile:   Carl [name] is a 70 y.o. male with a hx of smoking, mild mental retardation, CAD s/p CABG x 5 in 2015 with LIMA-LAD, SVG-D1, sequential SVG-OM2/OM3, SVG-PDA, ICM with EF as low as 15% s/p ICD insertion, MR s/p Mitraclips, CKD stage 3, melena, left hip fracture, colonic AVMs, cirrhosis,  who is being seen today for the evaluation of CHF at the request of Dr. Jacqualine Code.  History of Present Illness:   Carl [name] is followed by the advanced heart failure team. The patient has a history of smoking and mild retardation who was admitted to [facility] in 07/2014 with dyspnea. Tnl was 24. LHC showed 3V disease with EF 15%. Echo showed EF 15-20%. Patient underwent CABG x 5. It was difficult to wean off pressors but was later started on midodrine. Echo in 09/2016 showed EF 30-35%, mild LV dilation, rWMA, mod MR, mild to mod decreased RV systolic function. Cardiolite 2/18 showed large inferoseptal/inferior/inferolateral infarction with no ischemia. Pt was admitted to [facility] 05/2017 with acute heart failure. Discharge weight was 175lbs. Echo 05/2017 showed EF 20-25% with severe MR, possible ischemic MR. TEE was done 01/2018 which showed EF 20-25%, confirmed severe ischemic MR with restricted posterior leaflet. HE was seen by structural team for Mitraclip insertion. R/L heart cath 11/2018, showing patent grafts and elevated R>L filling pressures with preserved cardiac output. He had Mitraclip placed successfully in 12/2018. Given relatively sizeable ASD after procedure, he had Amplatzer device closure of AS. Echo post-op showed EF 25-30%, severe RV  systolic dysfunction, mild MR/MS (mean gradient 21mmHg), moderate-severe TR severe biatrial enlargement. Echo 01/2019 showed EF 20-25%, moderate LV dilation, severely decreased RV systolic function, s/p Mitraclip with mil-mod MR and mean gradient 81mmHg across the valve. Losartan stopped due to AKI.   Patient underwent [place] in 10/2019 and again in 11/2019 for afib. TEE 11/2019 showed EF 20-25%, moderate LV enlargement, mild RV enlargement with moderately decreased systolic function, moderate-severe TR, 2 Mitraclips with moderate MR and minimal MS.   In 06/2020 the patient was admitted for symptomatic anemia and required transfusion. EGD unremarkable and colonoscopy showed 2 polyps. Also had AKI. Torsemide was later restarted.   He was last seen 08/20/20 and was currently residing at [facility]. Weight was down and was not eating much. No cardiac symptoms. Torsemide was increased. Eliquis 2.5mg  BID was restarted.  On 2/8 torsemide was increased to 80 in the AM and 40 in the PM  The patient presented to the ER 10/09/19 with AMS. EMS reported fatigue and weakness. Patient reported BRBPR. Apparently patient had heme+ stool last week. Also was diagnosed with a UTI. In the ER BP was severely low 53/32, pulse 40, 16RR, hypothermic, O2 96%. Labs showed sodium 122, potassium 6.0, creatinine 4.90, BUN 106, albumin 2.7, WBC 6.0, Hgb 6.6. BNP 564. COVID negative. EKG showed Wide complex bradycardia, possible junctional rhythm. CXR showed impvoed vascular congestion and edma compared to prior study. Patient remained hypotensive despite IVF and was started on levophed. GI consulted for anemia. Patient admitted to ICU.   Past Medical History:  Diagnosis Date  . AICD (automatic cardioverter/defibrillator) present   . Anemia   .  Anxiety   . Arthritis   . Brunner's gland hyperplasia of duodenum   . CHF (congestive heart failure) ([place])   . Chronic kidney disease   . Cirrhosis of liver ([place])   . Coronary artery disease   .  Depression   . Dysrhythmia    atrial fib  . External hemorrhoids   . Fracture of femoral neck, left ([place]) 07/21/2018  . GERD (gastroesophageal reflux disease)   . Gout   . Hearing loss   . Heart murmur    mirtal valve insufficiency  . Hepatic failure ([place])   . HH (hiatus hernia)   . Hyperlipidemia   . Hypokalemia   . Hyposmolality   . Intellectual disability    mild  . Internal hemorrhoids   . Ischemic cardiomyopathy   . Leg fracture, left   . Myocardial infarction ([place])   . Osteoporosis   . Paronychia   . Peripheral vascular disease ([place])   . Pneumonia   . Psoriasis   . Psoriasis   . PUD (peptic ulcer disease)   . Schatzki's ring   . Tubular adenoma of colon   . Vitiligo     Past Surgical History:  Procedure Laterality Date  . ATRIAL SEPTAL DEFECT(ASD) CLOSURE N/A 12/30/2018   Procedure: ATRIAL SEPTAL DEFECT(ASD) CLOSURE;  Surgeon: [name], MD;  Location: [place] CV LAB;  Service: Cardiovascular;  Laterality: N/A;  . CARDIAC SURGERY     ICD  . CARDIOVERSION N/A 11/09/2019   Procedure: CARDIOVERSION;  Surgeon: [name], MD;  Location: [facility] ENDOSCOPY;  Service: Cardiovascular;  Laterality: N/A;  . CARDIOVERSION N/A 12/21/2019   Procedure: CARDIOVERSION;  Surgeon: [name], MD;  Location: [place];  Service: Cardiovascular;  Laterality: N/A;  . CATARACT EXTRACTION W/PHACO Right 07/26/2020   Procedure: CATARACT EXTRACTION PHACO AND INTRAOCULAR LENS PLACEMENT ([place]) RIGHT VISION BLUE;  Surgeon: Birder Robson, MD;  Location: [facility] ORS;  Service: Ophthalmology;  Laterality: Right;  [place] 01:14.1 CDE 13.65 Fluid Pack Lot # [id number]  . COLONOSCOPY WITH ESOPHAGOGASTRODUODENOSCOPY (EGD)    . COLONOSCOPY WITH PROPOFOL N/A 08/24/2017   Procedure: COLONOSCOPY WITH PROPOFOL;  Surgeon: [name], MD;  Location: [facility] ENDOSCOPY;  Service: Endoscopy;  Laterality: N/A;  . COLONOSCOPY WITH PROPOFOL N/A 07/23/2020   Procedure: COLONOSCOPY WITH PROPOFOL;   Surgeon: Toledo, [name], MD;  Location: [facility] ENDOSCOPY;  Service: Gastroenterology;  Laterality: N/A;  . CORONARY ARTERY BYPASS GRAFT N/A 08/03/2014   Procedure: CORONARY ARTERY BYPASS GRAFTING (CABG);  Surgeon: [name], MD;  Location: [place];  Service: Open Heart Surgery;  Laterality: N/A;  . ENTEROSCOPY N/A 08/06/2020   Procedure: ENTEROSCOPY;  Surgeon: Jonathon Bellows, MD;  Location: [facility] ENDOSCOPY;  Service: Gastroenterology;  Laterality: N/A;  . EP IMPLANTABLE DEVICE N/A 05/01/2015   MDT ICD implanted for primary prevention of sudden death  . ESOPHAGOGASTRODUODENOSCOPY N/A 06/28/2020   Procedure: ESOPHAGOGASTRODUODENOSCOPY (EGD);  Surgeon: Clarene Essex, MD;  Location: [place];  Service: Endoscopy;  Laterality: N/A;  . ESOPHAGOGASTRODUODENOSCOPY (EGD) WITH PROPOFOL N/A 04/16/2015   Procedure: ESOPHAGOGASTRODUODENOSCOPY (EGD) WITH PROPOFOL;  Surgeon: [name], MD;  Location: [facility] ENDOSCOPY;  Service: Endoscopy;  Laterality: N/A;  . ESOPHAGOGASTRODUODENOSCOPY (EGD) WITH PROPOFOL N/A 08/24/2017   Procedure: ESOPHAGOGASTRODUODENOSCOPY (EGD) WITH PROPOFOL;  Surgeon: [name], MD;  Location: [facility] ENDOSCOPY;  Service: Endoscopy;  Laterality: N/A;  . FRACTURE SURGERY Left    ORIF lower leg  . GIVENS CAPSULE STUDY N/A 08/03/2020   Procedure: GIVENS CAPSULE STUDY;  Surgeon: Jonathon Bellows, MD;  Location: [facility] ENDOSCOPY;  Service: Gastroenterology;  Laterality: N/A;  . HERNIA REPAIR    . HIP PINNING,CANNULATED Left 07/22/2018   Procedure: CANNULATED HIP PINNING;  Surgeon: [name], MD;  Location: [place];  Service: Orthopedics;  Laterality: Left;  . MITRAL VALVE REPAIR N/A 12/30/2018   Procedure: MITRAL VALVE REPAIR;  Surgeon: [name], MD;  Location: [place] CV LAB;  Service: Cardiovascular;  Laterality: N/A;  . RIGHT/LEFT HEART CATH AND CORONARY/GRAFT ANGIOGRAPHY N/A 12/21/2018   Procedure: RIGHT/LEFT HEART CATH AND CORONARY/GRAFT ANGIOGRAPHY;  Surgeon: [name], MD;  Location: [place] CV LAB;  Service: Cardiovascular;  Laterality: N/A;  . TEE WITHOUT CARDIOVERSION N/A 08/03/2014   Procedure: TRANSESOPHAGEAL ECHOCARDIOGRAM (TEE);  Surgeon: [name], MD;  Location: [place];  Service: Open Heart Surgery;  Laterality: N/A;  . TEE WITHOUT CARDIOVERSION N/A 02/10/2018   Procedure: TRANSESOPHAGEAL ECHOCARDIOGRAM (TEE);  Surgeon: [name], MD;  Location: [facility] ENDOSCOPY;  Service: Cardiovascular;  Laterality: N/A;  . TEE WITHOUT CARDIOVERSION N/A 11/09/2019   Procedure: TRANSESOPHAGEAL ECHOCARDIOGRAM (TEE);  Surgeon: [name], MD;  Location: [facility] ENDOSCOPY;  Service: Cardiovascular;  Laterality: N/A;     Home Medications:  Prior to Admission medications   Medication Sig Start Date End Date Taking? Authorizing Provider  acetaminophen (TYLENOL) 500 MG tablet Take 500-1,000 mg by mouth every 6 (six) hours as needed (for pain.).    [provider]  allopurinol (ZYLOPRIM) 100 MG tablet Take 100 mg by mouth daily.  02/08/19   [provider]  amiodarone (PACERONE) 200 MG tablet Take 1 tablet (200 mg total) by mouth daily. 04/13/20   Bensimhon, Shaune Pascal, MD  apixaban (ELIQUIS) 2.5 MG TABS tablet Take 1 tablet (2.5 mg total) by mouth 2 (two) times daily. Patient taking differently: Take 2.5 mg by mouth 2 (two) times daily. 1/2 tablet twice daily 08/20/20   [name], MD  Ascorbic Acid (VITAMIN C) 100 MG tablet Take 100 mg by mouth daily.    [provider]  atorvastatin (LIPITOR) 40 MG tablet TAKE 1 TABLET (40 MG TOTAL) BY MOUTH DAILY AT 6 PM. 08/27/20   [name], MD  carvedilol (COREG) 3.125 MG tablet TAKE 1 TABLET (3.125 MG TOTAL) BY MOUTH 2 (TWO) TIMES DAILY. 10/01/20   [name], MD  Cholecalciferol 50 MCG (2000 UT) TABS Take 2,000 Units by mouth at bedtime.     [provider]  Cyanocobalamin (VITAMIN B 12) 500 MCG TABS Take by mouth.    [provider]  cyanocobalamin 500 MCG  tablet Take 500 mcg by mouth daily.  Patient not taking: Reported on 09/19/2020    [provider]  docusate sodium (COLACE) 100 MG capsule Take 100 mg by mouth daily as needed for mild constipation.    [provider]  DULoxetine (CYMBALTA) 30 MG capsule Take 30 mg by mouth at bedtime.     [provider]  ferrous sulfate 325 (65 FE) MG tablet Take 1 tablet (325 mg total) by mouth daily with breakfast. 02/09/18   [name], MD  gabapentin (NEURONTIN) 300 MG capsule Take 300 mg by mouth daily. 08/23/20   [provider]  ketoconazole (NIZORAL) 2 % shampoo Apply 1 application topically 2 (two) times a week. Mon and Thu 09/28/18   [provider]  lactulose (CHRONULAC) 10 GM/15ML solution Take 30 mLs (20 g total) by mouth daily. 07/05/20   Gaylan Gerold, DO  metolazone (ZAROXOLYN) 2.5 MG tablet Take 1 tablet twice a week, every Monday and Thursday. Take 20 mEq Potassium with Metolazone. 07/30/20   [name], MD  Multiple Vitamin (MULTIVITAMIN) tablet Take 1 tablet by mouth at bedtime.     [provider]  Nutritional Supplements (ENSURE COMPLETE SHAKE) LIQD Take 1 Can by mouth 3 (three) times daily. Between meals 08/27/20   Lyda Jester M, PA-C  pantoprazole (PROTONIX) 40 MG tablet TAKE 1 TABLET BY MOUTH EVERY DAY 08/06/20   [name], MD  potassium chloride SA (KLOR-CON M20) 20 MEQ tablet Take 1 tablet (20 mEq total) by mouth daily. 06/06/20   [name], MD  sucralfate (CARAFATE) 1 g tablet Take 1 tablet (1 g total) by mouth 2 (two) times daily. 07/05/20   Gaylan Gerold, DO  torsemide (DEMADEX) 20 MG tablet Take 4 tablets (80 mg total) by mouth every morning AND 2 tablets (40 mg total) every evening. 10/03/20   [name], MD    Inpatient Medications: Scheduled Meds:  Continuous Infusions: . sodium chloride Stopped (10/08/20 1352)  . sodium chloride 250 mL (10/08/20 1429)  . lactated ringers 150 mL/hr at 10/08/20  1253  . norepinephrine (LEVOPHED) Adult infusion 8 mcg/min (10/08/20 1411)   PRN Meds:   Allergies:    Allergies  Allergen Reactions  . Spironolactone Other (See Comments)    gynecomastia     Social History:   Social History   Socioeconomic History  . Marital status: Single    Spouse name: Not on file  . Number of children: Not on file  . Years of education: Not on file  . Highest education level: Not on file  Occupational History  . Not on file  Tobacco Use  . Smoking status: Former Smoker    Quit date: 2014    Years since quitting: 8.1  . Smokeless tobacco: Never Used  . Tobacco comment: 07/30/2017 Quit in 2014  Vaping Use  . Vaping Use: Never used  Substance and Sexual Activity  . Alcohol use: No    Alcohol/week: 0.0 standard drinks  . Drug use: No  . Sexual activity: Never  Other Topics Concern  . Not on file  Social History Narrative   Pt lives in [place] with his mother.   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 Valvular heart disease Mother        Ruptured valve  . CAD Father   . Heart Problems Brother        Stents x 4  . Diabetes Brother   . Prostate cancer Neg Hx   . Bladder Cancer Neg Hx   . Kidney cancer Neg Hx      ROS:  Please see the history of present illness.  All other ROS reviewed and negative.     Physical Exam/Data:   Vitals:   10/08/20 1410 10/08/20 1429 10/08/20 1438 10/08/20 1448  BP: (!) 61/39 (!) 84/48 (!) 84/48   Pulse:  (!) 54  (!) 50  Resp:  17  12  Temp:  98 F (36.7 C)    TempSrc:  Oral    SpO2:   92% 98%  Weight:      Height:        Intake/Output Summary (Last 24 hours) at  10/08/2020 1449 Last data filed at 10/08/2020 1440 Gross per 24 hour  Intake 3060 ml  Output -  Net 3060 ml   Last 3 Weights 10/08/2020 09/19/2020 09/05/2020  Weight (lbs) 154 lb 5.2 oz 152 lb 3.2 oz 141 lb 9.6 oz  Weight (kg) 70 kg 69.037 kg 64.229 kg     Body mass index is 22.14 kg/m.  General:  Frail elderly WM HEENT: normal Lymph: no adenopathy Neck: no JVD Endocrine:  No thryomegaly Vascular: No carotid bruits; FA pulses 2+ bilaterally without bruits  Cardiac:  normal S1, S2; RRR; + murmur  Lungs:  clear to auscultation bilaterally, no wheezing, rhonchi or rales  Abd: soft, nontender, no hepatomegaly  Ext: no edema Musculoskeletal:  No deformities, BUE and BLE strength normal and equal Skin: warm and dry  Neuro:  AMS Psych:  Normal affect   EKG:  The EKG was personally reviewed and demonstrates:  Junctional bradycardia, 40 bpm, wide complex  QRS Telemetry:  Telemetry was personally reviewed and demonstrates:  Junctional bradycardia>SB, HR in the 40s, PVCs  Relevant CV Studies:  Echo 10/2019 1. Left ventricular ejection fraction, by estimation, is 20 to 25%. The  left ventricle has severely decreased function. The left ventricle  demonstrates global hypokinesis. The left ventricular internal cavity size  was moderately dilated.  2. Right ventricular systolic function is moderately reduced. The right  ventricular size is mildly enlarged. There is mildly elevated pulmonary  artery systolic pressure.  3. There was an Amplatzer atrial septal closure device present, no  shunting noted by color doppler. Left atrial size was moderately dilated.  No left atrial/left atrial appendage thrombus was detected.  4. Right atrial size was moderately dilated.  5. Peak RV-RA gradient 24 mmHg. Tricuspid valve regurgitation is moderate  to severe.  6. The aortic valve is tricuspid. Aortic valve regurgitation is trivial.  No aortic stenosis is present.  7. The mitral valve was s/p  repair with 2 Mitraclip devices placed in the  A2-P2 area. There was moderate residual MR with PISA ERO 0.25 cm^2. Mean  gradient 3 mmHg across the mitral valve, no significant stenosis. No  pulmonary vein systolic flow reversal  noted.  8. Normal caliber thoracic aorta with mild plaque.   Heart monitor 09/2019 Study Highlights  1. Predominant NSR.  2. 11% PVCs.  3. Possible atrial fibrillation, up to 5% of time  PPM interrogation 07/2020 Conclusion  Scheduled remote reviewed. Normal device function.   Optivol elevated and thoracic impedance below reference line and trending down. Routing to triage for further evaluation.  Next remote 91 days. HB  R/L [facility] 11/2018    Ost LAD to Prox LAD lesion is 100% stenosed.  Mid LM to Dist LM lesion is 99% stenosed.  Ost Cx to Prox Cx lesion is 100% stenosed.  Prox RCA lesion is 100% stenosed.  Seq SVG- OM1 and OM2 graft was visualized by angiography and is normal in caliber.  SVG graft was visualized by angiography and is normal in caliber.  SVG graft was visualized by angiography and is normal in caliber.  LIMA graft was visualized by angiography and is normal in caliber.  Hemodynamic findings consistent with mitral valve regurgitation.   1.  Severe native vessel CAD with total occlusion of the RCA, LAD, and left circumflex and  severe distal left main stenosis 2.  Status post multivessel CABG with continued patency of the LIMA to LAD, saphenous vein graft to first diagonal, sequential saphenous vein graft to OM 2 and OM 3, and saphenous vein graft to PDA 3.  Preserved cardiac output 4.  Large V waves consistent with severe mitral regurgitation  Recommendations: Continued evaluation by the multidisciplinary heart team for treatment options related to severe mitral insufficiency and acute on chronic systolic heart failure  Laboratory Data:  High Sensitivity Troponin:  No results for input(s): TROPONINIHS in the last 720 hours.    Chemistry Recent Labs  Lab 10/08/20 1244  NA 122*  K 6.0*  CL 89*  CO2 21*  GLUCOSE 90  BUN 106*  CREATININE 4.90*  CALCIUM 8.1*  GFRNONAA 12*  ANIONGAP 12    Recent Labs  Lab 10/08/20 1244  PROT 5.9*  ALBUMIN 2.7*  AST 27  ALT 19  ALKPHOS 129*  BILITOT 0.8   Hematology Recent Labs  Lab 10/08/20 1244  WBC 6.0  RBC 2.07*  HGB 6.6*  HCT 21.3*  MCV 102.9*  MCH 31.9  MCHC 31.0  RDW 18.0*  PLT 356   BNP Recent Labs  Lab 10/08/20 1245  BNP 564.4*    DDimer No results for input(s): DDIMER in the last 168 hours.   Radiology/Studies:  DG Chest Port 1 View  Result Date: 10/08/2020 CLINICAL DATA:  Sepsis EXAM: PORTABLE CHEST 1 VIEW COMPARISON:  08/12/2020 FINDINGS: Cardiac shadow is enlarged but stable. Defibrillator and postsurgical changes are again noted. Old rib fractures are noted on the right with healing and callus formation. Previously seen vascular congestion has improved. No focal infiltrate is noted. IMPRESSION: Improved vascular congestion and edema when compare with the prior exam. No focal infiltrate is seen. Electronically Signed   By: Inez Catalina M.D.   On: 10/08/2020 13:13     Assessment and Plan:   Shock-hypovolemic and hemorrhagic AMS - BP severely low on admission. S/p IVF and now on levophed - admitted to ICU - Hgb 6.0 s/p 1 unit PRBCs  Acute anemia Suspected GIB - Hgb down to 6.0. Apparently patient had heme+ stool last week - Pt was admitted 06/2020 for GIB . Eliquis was held - GI consulted - s/p 1 unit  Juncitonal bradycardia - EKG with juncitonal bradycardia in the setting of hypothermia, severe anemia, AKI, and electrolyte abnormality - now in SB with heart rates in the 40s - hold amiodarone and coreg - continue telemetry  AKI CKD stage 3 Hyperkalemia - Baseline creatinine around 1.8 and potassium 6.0 - creatinine on admission around 4 - nephrology consulted  Acute on chronic combined heart failure/ICM S/p AICD -  Echo 11/2019 showed EF 20-25%, moderately decreased RV systolic function.  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

## 2020-10-08 NOTE — Consult Note (Signed)
CODE SEPSIS - PHARMACY COMMUNICATION  **Broad Spectrum Antibiotics should be administered within 1 hour of Sepsis diagnosis**  Time Code Sepsis Called/Page Received: 1241  Antibiotics Ordered: 8638  Time of 1st antibiotic administration: 1304  Additional action taken by pharmacy: N/A  If necessary, Name of Provider/Nurse Contacted: N/A    Darnelle Bos ,PharmD Clinical Pharmacist  10/08/2020  12:53 PM

## 2020-10-08 NOTE — Progress Notes (Signed)
Received a phone call from Carl [name] brother stating that Carl [name] is unable to stand or even sit upright on the couch this morning.  He states pulls Carl [name] to a sitting position and Carl [name] falls to the right.  Carl [name] states Carl [name] appears more confused than his normal.  He is not eating or drinking much.  Carl [name] to call 911 and get Carl [name] evaluated at ED if he is unable to sit upright or stand.   [place] [phone]

## 2020-10-08 NOTE — Consult Note (Signed)
Consultation  Referring Provider:     Dr Jacqualine Code Admit date 10/08/20 Consult date        2/14.22 Reason for Consultation:     gib         HPI:   Carl [name] is a 70 y.o. male  With history of  Recurrent gib over the last 4-46m, s/p egd/colonoscopy/VCE/enteroscopy with the findings of a few avms and mild to moderate portal hypertensive gastropathy, and recent history of UTI admitted today with low hemoglobin and acute kidney failure, mental status changes.  *Additional pertinent history includes CAD w/[id number], CHF, AICD,and percutaneous mitral valve repair and transcatheter ASD closureon 12/30/18. Most recentECHO: EF 15-20% with severe MR.  He is here with his daughter- she helps with history as patient is drowsy/not verbalizing much at this time- she states normally he lives at home but due to his health problems has been staying with her. States he has been having black stools- but they have been decreasing in frequency from 3-4/d now to once daily. They have been loose more lately but she attrtibutes this to the antibiotics he is currently taking for UTI. He has also been taking iron.  She reports he has had no abdominal pain, dysphagia, NV,  Hematochezia, further GI concerns. 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 She  states he does not take any NSAIDs or drink any etoh.  She denies any ascites/edema in patient. States he has been progressively more confused over the last couple of days. Note on admission his hgb is 6.6 and he is in acute renal failure,rather uremic. He is receiving prbcs at this time. Alp 129, liver enzymes otherwise normal. His platelet count has been normal. Pt/inr 18.5/1.6 BNP elevated,  Last dose of eliquis was today.  Other meds include lactulose, pantoprazole, carafate,  torsemide.  Noted he has had extensive eval in the last 15m for concerns of GIB, decreasing hgb, done as below. He has been following with hematology for help with IDA and that he was referred to palliative care when last seen in GI clinic last month. Marland Kitchen    PREVIOUS ENDOSCOPIES:            08/08/20-Enteroscopy- Dr Vicente Males- normal esophagus, moderate portal hypertensive gastropathy, 4 jejunal avms that were cauterized 08/06/20- VCE- Dr Marius Ditch- blood of unknown origin in stomach - possible avms (enteroscopy recommended) Colonoscopy 07/23/2020- Dr [name]- couple of polyps, hemorrhoids, diverticulosis, poor prep in right colon EGD 06/28/20- Dr Watt Climes- small hiatal hernia, mild portal hypertensive gastropathy, normal duodenum Colonoscopy 08/24/17- Dr Tiffany Kocher- small sigmoid polyp EGD 08/24/17- Dr Elliot/heartburn- la grade a esophagitis/gastritis  Abdominal imaging:  [place] 10/11/19- fatty liver with nodular contour CT A/p with contrast 8/20- cardiomegaly, cirrhotic liver,   Past Medical History:  Diagnosis Date  . AICD (automatic cardioverter/defibrillator) present   . Anemia   . Anxiety   . Arthritis   . Brunner's gland hyperplasia of duodenum   . CHF (congestive heart failure) ([place])   . Chronic kidney disease   . Cirrhosis of liver ([place])   . Coronary artery disease   . Depression   . Dysrhythmia    atrial fib  . External hemorrhoids   . Fracture of femoral neck, left ([place]) 07/21/2018  . GERD (gastroesophageal reflux disease)   . Gout   .  Hearing loss   . Heart murmur    mirtal valve insufficiency  . Hepatic failure ([place])   . HH (hiatus hernia)   . Hyperlipidemia   . Hypokalemia   . Hyposmolality   . Intellectual disability    mild  . Internal hemorrhoids   . Ischemic cardiomyopathy   . Leg fracture, left   . Myocardial infarction ([place])   . Osteoporosis   . Paronychia   . Peripheral vascular disease ([place])   . Pneumonia   . Psoriasis   . Psoriasis   . PUD (peptic ulcer disease)    . Schatzki's ring   . Tubular adenoma of colon   . Vitiligo     Past Surgical History:  Procedure Laterality Date  . ATRIAL SEPTAL DEFECT(ASD) CLOSURE N/A 12/30/2018   Procedure: ATRIAL SEPTAL DEFECT(ASD) CLOSURE;  Surgeon: [name], MD;  Location: [place] [facility];  Service: Cardiovascular;  Laterality: N/A;  . CARDIAC SURGERY     ICD  . CARDIOVERSION N/A 11/09/2019   Procedure: CARDIOVERSION;  Surgeon: [name], MD;  Location: [facility] ENDOSCOPY;  Service: Cardiovascular;  Laterality: N/A;  . CARDIOVERSION N/A 12/21/2019   Procedure: CARDIOVERSION;  Surgeon: [name], MD;  Location: [place];  Service: Cardiovascular;  Laterality: N/A;  . CATARACT EXTRACTION W/PHACO Right 07/26/2020   Procedure: CATARACT EXTRACTION PHACO AND INTRAOCULAR LENS PLACEMENT ([place]) RIGHT VISION BLUE;  Surgeon: Birder Robson, MD;  Location: [facility] ORS;  Service: Ophthalmology;  Laterality: Right;  [place] 01:14.1 CDE 13.65 Fluid Pack Lot # [id number]  . COLONOSCOPY WITH ESOPHAGOGASTRODUODENOSCOPY (EGD)    . COLONOSCOPY WITH PROPOFOL N/A 08/24/2017   Procedure: COLONOSCOPY WITH PROPOFOL;  Surgeon: [name], MD;  Location: [facility] ENDOSCOPY;  Service: Endoscopy;  Laterality: N/A;  . COLONOSCOPY WITH PROPOFOL N/A 07/23/2020   Procedure: COLONOSCOPY WITH PROPOFOL;  Surgeon: Tol[place], [name], MD;  Location: [facility] ENDOSCOPY;  Service: Gastroenterology;  Laterality: N/A;  . CORONARY ARTERY BYPASS GRAFT N/A 08/03/2014   Procedure: CORONARY ARTERY BYPASS GRAFTING (CABG);  Surgeon: [name], MD;  Location: [place];  Service: Open Heart Surgery;  Laterality: N/A;  . ENTEROSCOPY N/A 08/06/2020   Procedure: ENTEROSCOPY;  Surgeon: Jonathon Bellows, MD;  Location: [facility] ENDOSCOPY;  Service: Gastroenterology;  Laterality: N/A;  . EP IMPLANTABLE DEVICE N/A 05/01/2015   MDT ICD implanted for primary prevention of sudden death  . ESOPHAGOGASTRODUODENOSCOPY N/A 06/28/2020   Procedure: ESOPHAGOGASTRODUODENOSCOPY  (EGD);  Surgeon: Clarene Essex, MD;  Location: [place];  Service: Endoscopy;  Laterality: N/A;  . ESOPHAGOGASTRODUODENOSCOPY (EGD) WITH PROPOFOL N/A 04/16/2015   Procedure: ESOPHAGOGASTRODUODENOSCOPY (EGD) WITH PROPOFOL;  Surgeon: [name], MD;  Location: [facility] ENDOSCOPY;  Service: Endoscopy;  Laterality: N/A;  . ESOPHAGOGASTRODUODENOSCOPY (EGD) WITH PROPOFOL N/A 08/24/2017   Procedure: ESOPHAGOGASTRODUODENOSCOPY (EGD) WITH PROPOFOL;  Surgeon: [name], MD;  Location: [facility] ENDOSCOPY;  Service: Endoscopy;  Laterality: N/A;  . FRACTURE SURGERY Left    ORIF lower leg  . GIVENS CAPSULE STUDY N/A 08/03/2020   Procedure: GIVENS CAPSULE STUDY;  Surgeon: Jonathon Bellows, MD;  Location: [facility] ENDOSCOPY;  Service: Gastroenterology;  Laterality: N/A;  . HERNIA REPAIR    . HIP PINNING,CANNULATED Left 07/22/2018   Procedure: CANNULATED HIP PINNING;  Surgeon: [name], MD;  Location: [place];  Service: Orthopedics;  Laterality: Left;  . MITRAL VALVE REPAIR N/A 12/30/2018   Procedure: MITRAL VALVE REPAIR;  Surgeon: [name], MD;  Location: [place] [facility];  Service: Cardiovascular;  Laterality: N/A;  . RIGHT/LEFT  HEART CATH AND CORONARY/GRAFT ANGIOGRAPHY N/A 12/21/2018   Procedure: RIGHT/LEFT HEART CATH AND CORONARY/GRAFT ANGIOGRAPHY;  Surgeon: [name], MD;  Location: [place] [facility];  Service: Cardiovascular;  Laterality: N/A;  . TEE WITHOUT CARDIOVERSION N/A 08/03/2014   Procedure: TRANSESOPHAGEAL ECHOCARDIOGRAM (TEE);  Surgeon: [name], MD;  Location: [place];  Service: Open Heart Surgery;  Laterality: N/A;  . TEE WITHOUT CARDIOVERSION N/A 02/10/2018   Procedure: TRANSESOPHAGEAL ECHOCARDIOGRAM (TEE);  Surgeon: [name], MD;  Location: [facility] ENDOSCOPY;  Service: Cardiovascular;  Laterality: N/A;  . TEE WITHOUT CARDIOVERSION N/A 11/09/2019   Procedure: TRANSESOPHAGEAL ECHOCARDIOGRAM (TEE);  Surgeon: [name], MD;  Location: [facility] ENDOSCOPY;  Service:  Cardiovascular;  Laterality: N/A;    Family History  Problem Relation Age of Onset  . Valvular heart disease Mother        Ruptured valve  . CAD Father   . Heart Problems Brother        Stents x 4  . Diabetes Brother   . Prostate cancer Neg Hx   . Bladder Cancer Neg Hx   . Kidney cancer Neg Hx      Social History   Tobacco Use  . Smoking status: Former Smoker    Quit date: 2014    Years since quitting: 8.1  . Smokeless tobacco: Never Used  . Tobacco comment: 07/30/2017 Quit in 2014  Vaping Use  . Vaping Use: Never used  Substance Use Topics  . Alcohol use: No    Alcohol/week: 0.0 standard drinks  . Drug use: No    Prior to Admission medications   Medication Sig Start Date End Date Taking? Authorizing Provider  acetaminophen (TYLENOL) 500 MG tablet Take 500-1,000 mg by mouth every 6 (six) hours as needed (for pain.).    [provider]  allopurinol (ZYLOPRIM) 100 MG tablet Take 100 mg by mouth daily.  02/08/19   [provider]  amiodarone (PACERONE) 200 MG tablet Take 1 tablet (200 mg total) by mouth daily. 04/13/20   Bensimhon, Shaune Pascal, MD  apixaban (ELIQUIS) 2.5 MG TABS tablet Take 1 tablet (2.5 mg total) by mouth 2 (two) times daily. Patient taking differently: Take 2.5 mg by mouth 2 (two) times daily. 1/2 tablet twice daily 08/20/20   [name], MD  Ascorbic Acid (VITAMIN C) 100 MG tablet Take 100 mg by mouth daily.    [provider]  atorvastatin (LIPITOR) 40 MG tablet TAKE 1 TABLET (40 MG TOTAL) BY MOUTH DAILY AT 6 PM. 08/27/20   [name], MD  carvedilol (COREG) 3.125 MG tablet TAKE 1 TABLET (3.125 MG TOTAL) BY MOUTH 2 (TWO) TIMES DAILY. 10/01/20   [name], MD  Cholecalciferol 50 MCG (2000 UT) TABS Take 2,000 Units by mouth at bedtime.     [provider]  Cyanocobalamin (VITAMIN B 12) 500 MCG TABS Take by mouth.    [provider]  cyanocobalamin 500 MCG tablet Take 500 mcg by mouth daily.  Patient not  taking: Reported on 09/19/2020    [provider]  docusate sodium (COLACE) 100 MG capsule Take 100 mg by mouth daily as needed for mild constipation.    [provider]  DULoxetine (CYMBALTA) 30 MG capsule Take 30 mg by mouth at bedtime.     [provider]  ferrous sulfate 325 (65 FE) MG tablet Take 1 tablet (325 mg total) by mouth daily with breakfast. 02/09/18   [name], MD  gabapentin (NEURONTIN)  300 MG capsule Take 300 mg by mouth daily. 08/23/20   [provider]  ketoconazole (NIZORAL) 2 % shampoo Apply 1 application topically 2 (two) times a week. Mon and Thu 09/28/18   [provider]  lactulose (CHRONULAC) 10 GM/15ML solution Take 30 mLs (20 g total) by mouth daily. 07/05/20   Gaylan Gerold, DO  metolazone (ZAROXOLYN) 2.5 MG tablet Take 1 tablet twice a week, every Monday and Thursday. Take 20 mEq Potassium with Metolazone. 07/30/20   [name], MD  Multiple Vitamin (MULTIVITAMIN) tablet Take 1 tablet by mouth at bedtime.     [provider]  Nutritional Supplements (ENSURE COMPLETE SHAKE) LIQD Take 1 Can by mouth 3 (three) times daily. Between meals 08/27/20   Lyda Jester M, PA-C  pantoprazole (PROTONIX) 40 MG tablet TAKE 1 TABLET BY MOUTH EVERY DAY 08/06/20   [name], MD  potassium chloride SA (KLOR-CON M20) 20 MEQ tablet Take 1 tablet (20 mEq total) by mouth daily. 06/06/20   [name], MD  sucralfate (CARAFATE) 1 g tablet Take 1 tablet (1 g total) by mouth 2 (two) times daily. 07/05/20   Gaylan Gerold, DO  torsemide (DEMADEX) 20 MG tablet Take 4 tablets (80 mg total) by mouth every morning AND 2 tablets (40 mg total) every evening. 10/03/20   [name], MD    Current Facility-Administered Medications  Medication Dose Route Frequency Provider Last Rate Last Admin  . 0.9 %  sodium chloride infusion  250 mL Intravenous Continuous Delman Kitten, MD   Stopped at 10/08/20 1352  . 0.9 %  sodium chloride  infusion  250 mL Intravenous Continuous Delman Kitten, MD      . lactated ringers infusion   Intravenous Continuous Delman Kitten, MD 150 mL/hr at 10/08/20 1253 New Bag at 10/08/20 1253  . norepinephrine (LEVOPHED) 4mg  in 270mL premix infusion  2-10 mcg/min Intravenous Titrated Delman Kitten, MD 30 mL/hr at 10/08/20 1411 8 mcg/min at 10/08/20 1411   Current Outpatient Medications  Medication Sig Dispense Refill  . acetaminophen (TYLENOL) 500 MG tablet Take 500-1,000 mg by mouth every 6 (six) hours as needed (for pain.).    Marland Kitchen allopurinol (ZYLOPRIM) 100 MG tablet Take 100 mg by mouth daily.     Marland Kitchen amiodarone (PACERONE) 200 MG tablet Take 1 tablet (200 mg total) by mouth daily. 90 tablet 1  . apixaban (ELIQUIS) 2.5 MG TABS tablet Take 1 tablet (2.5 mg total) by mouth 2 (two) times daily. (Patient taking differently: Take 2.5 mg by mouth 2 (two) times daily. 1/2 tablet twice daily) 60 tablet 0  . Ascorbic Acid (VITAMIN C) 100 MG tablet Take 100 mg by mouth daily.    Marland Kitchen atorvastatin (LIPITOR) 40 MG tablet TAKE 1 TABLET (40 MG TOTAL) BY MOUTH DAILY AT 6 PM. 90 tablet 3  . carvedilol (COREG) 3.125 MG tablet TAKE 1 TABLET (3.125 MG TOTAL) BY MOUTH 2 (TWO) TIMES DAILY. 180 tablet 3  . Cholecalciferol 50 MCG (2000 UT) TABS Take 2,000 Units by mouth at bedtime.     . Cyanocobalamin (VITAMIN B 12) 500 MCG TABS Take by mouth.    . cyanocobalamin 500 MCG tablet Take 500 mcg by mouth daily.  (Patient not taking: Reported on 09/19/2020)    . docusate sodium (COLACE) 100 MG capsule Take 100 mg by mouth daily as needed for mild constipation.    . DULoxetine (CYMBALTA) 30 MG capsule Take 30 mg by mouth at bedtime.     Marland Kitchen  ferrous sulfate 325 (65 FE) MG tablet Take 1 tablet (325 mg total) by mouth daily with breakfast. 30 tablet 3  . gabapentin (NEURONTIN) 300 MG capsule Take 300 mg by mouth daily.    Marland Kitchen ketoconazole (NIZORAL) 2 % shampoo Apply 1 application topically 2 (two) times a week. Mon and Thu    . lactulose  (CHRONULAC) 10 GM/15ML solution Take 30 mLs (20 g total) by mouth daily. 236 mL 0  . metolazone (ZAROXOLYN) 2.5 MG tablet Take 1 tablet twice a week, every Monday and Thursday. Take 20 mEq Potassium with Metolazone. 24 tablet 3  . Multiple Vitamin (MULTIVITAMIN) tablet Take 1 tablet by mouth at bedtime.     . Nutritional Supplements (ENSURE COMPLETE SHAKE) LIQD Take 1 Can by mouth 3 (three) times daily. Between meals 237 mL 11  . pantoprazole (PROTONIX) 40 MG tablet TAKE 1 TABLET BY MOUTH EVERY DAY 90 tablet 2  . potassium chloride SA (KLOR-CON M20) 20 MEQ tablet Take 1 tablet (20 mEq total) by mouth daily. 30 tablet 3  . sucralfate (CARAFATE) 1 g tablet Take 1 tablet (1 g total) by mouth 2 (two) times daily.    Marland Kitchen torsemide (DEMADEX) 20 MG tablet Take 4 tablets (80 mg total) by mouth every morning AND 2 tablets (40 mg total) every evening. 540 tablet 2    Allergies as of 10/08/2020 - Review Complete 10/08/2020  Allergen Reaction Noted  . Spironolactone Other (See Comments) 02/20/2017     Review of Systems:    All systems reviewed and negative except where noted in HPI.     Physical Exam:  Vital signs in last 24 hours: Temp:  [98 F (36.7 C)] 98 F (36.7 C) (02/14 1429) Pulse Rate:  [40-54] 54 (02/14 1429) Resp:  [16-17] 17 (02/14 1429) BP: (43-84)/(29-48) 84/48 (02/14 1429) SpO2:  [96 %-98 %] 98 % (02/14 1308) Weight:  [70 kg] 70 kg (02/14 1233)   General:   Drowsy elderly man, thin, ill appearing Head:  Normocephalic and atraumatic. Eyes:   No icterus.   Conjunctiva pale pink. Ears:  Normal auditory acuity. Mouth: Mucosa pink moist, no lesions. Neck:  Supple; no masses felt Lungs: Respirations even and unlabored. Lungs clear to auscultation bilaterally.   No wheezes, crackles, or rhonchi.  Heart:  S1S2, he is bradycardic, rhythm regular, no MRG. No edema. Abdomen:   Flat, soft, nondistended, nontender. Normal bowel sounds. No appreciable masses or hepatomegaly. No rebound  signs or other peritoneal signs. Rectal:  Not performed.  Msk:  MAE x 4- there is generalized weakness and occasional twitching of extremites, No clubbing or cyanosis. Strength 5/5. Symmetrical without gross deformities. Neurologic:  Alert and  oriented x 2;  Cranial nerves II-XII intact.  Skin:  Warm, dry, pink without significant rashes. There are some hypopigmented areas consistent with his history of vitilogo Psych:  Drowsy- . Normal affect.  LAB RESULTS: Recent Labs    10/08/20 1244  WBC 6.0  HGB 6.6*  HCT 21.3*  PLT 356   BMET Recent Labs    10/08/20 1244  NA 122*  K 6.0*  CL 89*  CO2 21*  GLUCOSE 90  BUN 106*  CREATININE 4.90*  CALCIUM 8.1*   LFT Recent Labs    10/08/20 1244  PROT 5.9*  ALBUMIN 2.7*  AST 27  ALT 19  ALKPHOS 129*  BILITOT 0.8   PT/INR Recent Labs    10/08/20 1244  LABPROT 18.5*  INR 1.6*    STUDIES: DG  Chest Port 1 View  Result Date: 10/08/2020 CLINICAL DATA:  Sepsis EXAM: PORTABLE CHEST 1 VIEW COMPARISON:  08/12/2020 FINDINGS: Cardiac shadow is enlarged but stable. Defibrillator and postsurgical changes are again noted. Old rib fractures are noted on the right with healing and callus formation. Previously seen vascular congestion has improved. No focal infiltrate is noted. IMPRESSION: Improved vascular congestion and edema when compare with the prior exam. No focal infiltrate is seen. Electronically Signed   By: Inez Catalina M.D.   On: 10/08/2020 13:13       Impression / Plan:   1. Anemia exacerbation in setting of multisystem organ failure- possible rebleed from avm however condition has been deteriorating and current condiction precludes sedated procedures. We are are recommend palliative care consult prior to further testing. Agree with ppi/transfusions at this time.  Thank you very much for this consult. These services were provided by Stephens November, NP-C, in collaboration with Andrey Farmer MD, with whom I have discussed  this patient in full.   Stephens November, NP-C

## 2020-10-08 NOTE — Progress Notes (Signed)
[facility] Liaison note:  This patient is currently followed by [facility] community based Palliative program at home. TOC Teresita Maxey made aware via [place]. Liaison to follow for discharge disposition. Flo Shanks BSN, RN, [facility] [employer] [phone]

## 2020-10-08 NOTE — ED Provider Notes (Signed)
[facility] Emergency Department Provider Note   ____________________________________________   Event Date/Time   First MD Initiated Contact with Patient 10/08/20 1230     (approximate)  I have reviewed the triage vital signs and the nursing notes.   HISTORY  Chief Complaint Hypotension (50/30 blood pressure per ems , fluid around pacemaker, positive guiac, )  EM caveat: Encephalopathy and/or poor historian  HPI Carl [name] is a 70 y.o. male extensive history including CHF cirrhosis, AICD, atrial fibrillation, previous GI bleeding and AV malformations  Today please see my additional notation of discussion with the patient's care team and community paramedic  EMS reports called out for the patient having fatigue weakness unable to get himself up today.  Patient himself tells me that recently he has had a large amount of blood in his stool he has been feeling tired.  He cannot give me much else history is a very poor historian  He denies being any pain or discomfort.  Discussed with [place] community paramedic who advised the patient had a recent heme positive stool sample within the last week, also was started on treatment for possible UTI and also had been aggressively diuresed recently for concerns of congestive heart failure.    Past Medical History:  Diagnosis Date  . AICD (automatic cardioverter/defibrillator) present   . Anemia   . Anxiety   . Arthritis   . Brunner's gland hyperplasia of duodenum   . CHF (congestive heart failure) ([place])   . Chronic kidney disease   . Cirrhosis of liver ([place])   . Coronary artery disease   . Depression   . Dysrhythmia    atrial fib  . External hemorrhoids   . Fracture of femoral neck, left ([place]) 07/21/2018  . GERD (gastroesophageal reflux disease)   . Gout   . Hearing loss   . Heart murmur    mirtal valve insufficiency  . Hepatic failure ([place])   . HH (hiatus hernia)   . Hyperlipidemia   .  Hypokalemia   . Hyposmolality   . Intellectual disability    mild  . Internal hemorrhoids   . Ischemic cardiomyopathy   . Leg fracture, left   . Myocardial infarction ([place])   . Osteoporosis   . Paronychia   . Peripheral vascular disease ([place])   . Pneumonia   . Psoriasis   . Psoriasis   . PUD (peptic ulcer disease)   . Schatzki's ring   . Tubular adenoma of colon   . Vitiligo     Patient Active Problem List   Diagnosis Date Noted  . Acute kidney injury superimposed on CKD ([place]) 08/03/2020  . Leukocytosis 08/03/2020  . Acute on chronic blood loss anemia 08/02/2020  . Pressure injury of skin 06/29/2020  . Hyponatremia 06/27/2020  . GI bleed 06/27/2020  . 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 Persistent atrial fibrillation ([place]) 11/30/2019  . Secondary hypercoagulable state ([place]) 11/30/2019  . Arm DVT (deep venous thromboembolism), acute, left ([place]) 11/26/2019  . Hx of adenomatous colonic polyps 05/18/2019  . Cirrhosis ([place]) 05/12/2019  . Occult blood positive stool 05/12/2019  . HCAP (healthcare-associated pneumonia) 03/04/2019  . Hallucinations 02/10/2019  . Elevated blood sugar 01/27/2019  . S/P mitral valve repair 12/31/2018  . Bilateral pulmonary contusion 10/17/2018  . Cardiac LV ejection fraction of 20-34% 10/17/2018  . Chronic systolic CHF (congestive heart failure) ([place]) 10/17/2018  . History of ischemic cardiomyopathy 10/17/2018  . ICD (implantable cardioverter-defibrillator) in place 10/17/2018  .  S/p left hip fracture 10/17/2018  . Traumatic retroperitoneal hematoma 10/17/2018  . Age-related osteoporosis with current pathological fracture with routine healing 07/28/2018  . CAD (coronary artery disease) 07/21/2018  . GERD (gastroesophageal reflux disease) 07/21/2018  . Depression 07/21/2018  . CKD (chronic kidney disease), stage III ([place]) 07/21/2018  . Iron deficiency anemia 07/21/2018  . Hypokalemia 07/21/2018  . Closed left hip fracture ([place])  07/21/2018  . Fracture of femoral neck, left ([place]) 07/21/2018  . Lower extremity pain, bilateral 11/12/2017  . 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 Hyperlipidemia 09/01/2017  . Essential hypertension 09/01/2017  . PAD (peripheral artery disease) ([place]) 09/01/2017  . Symptomatic anemia 08/06/2017  . GAD (generalized anxiety disorder) 06/18/2017  . Prostate cancer screening 04/28/2017  . Hydrocele 04/28/2017  . Bradycardia   . Premature ventricular contraction   . Severe mitral insufficiency   . Cardiomyopathy, ischemic 04/26/2015  . Difficulty hearing 12/29/2014  . Acute on chronic systolic CHF (congestive heart failure) ([place]) 10/10/2014  . 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 Protein-calorie malnutrition, severe ([place]) 07/29/2014  . AKI (acute kidney injury) ([place]) 07/29/2014    Past Surgical History:  Procedure Laterality Date  . ATRIAL SEPTAL DEFECT(ASD) CLOSURE N/A 12/30/2018   Procedure: ATRIAL SEPTAL DEFECT(ASD) CLOSURE;  Surgeon: [name], MD;  Location: [place] [facility];  Service: Cardiovascular;  Laterality: N/A;  . CARDIAC SURGERY     ICD  . CARDIOVERSION N/A 11/09/2019   Procedure: CARDIOVERSION;  Surgeon: [name], MD;  Location: [facility] ENDOSCOPY;  Service: Cardiovascular;  Laterality: N/A;  . CARDIOVERSION N/A 12/21/2019   Procedure: CARDIOVERSION;  Surgeon: [name], MD;  Location: [place];  Service: Cardiovascular;  Laterality: N/A;  . CATARACT EXTRACTION W/PHACO Right 07/26/2020   Procedure: CATARACT EXTRACTION PHACO AND INTRAOCULAR LENS PLACEMENT ([place]) RIGHT VISION BLUE;  Surgeon: Birder Robson, MD;  Location: [facility] ORS;  Service: Ophthalmology;  Laterality: Right;  [place] 01:14.1 CDE 13.65 Fluid Pack Lot # [id number]  . COLONOSCOPY WITH ESOPHAGOGASTRODUODENOSCOPY (EGD)    . COLONOSCOPY WITH PROPOFOL N/A 08/24/2017   Procedure: COLONOSCOPY WITH PROPOFOL;  Surgeon: [name], MD;  Location: [facility] ENDOSCOPY;  Service: Endoscopy;   Laterality: N/A;  . COLONOSCOPY WITH PROPOFOL N/A 07/23/2020   Procedure: COLONOSCOPY WITH PROPOFOL;  Surgeon: Tol[place], [name], MD;  Location: [facility] ENDOSCOPY;  Service: Gastroenterology;  Laterality: N/A;  . CORONARY ARTERY BYPASS GRAFT N/A 08/03/2014   Procedure: CORONARY ARTERY BYPASS GRAFTING (CABG);  Surgeon: [name], MD;  Location: [place];  Service: Open Heart Surgery;  Laterality: N/A;  . ENTEROSCOPY N/A 08/06/2020   Procedure: ENTEROSCOPY;  Surgeon: Jonathon Bellows, MD;  Location: [facility] ENDOSCOPY;  Service: Gastroenterology;  Laterality: N/A;  . EP IMPLANTABLE DEVICE N/A 05/01/2015   MDT ICD implanted for primary prevention of sudden death  . ESOPHAGOGASTRODUODENOSCOPY N/A 06/28/2020   Procedure: ESOPHAGOGASTRODUODENOSCOPY (EGD);  Surgeon: Clarene Essex, MD;  Location: [place];  Service: Endoscopy;  Laterality: N/A;  . ESOPHAGOGASTRODUODENOSCOPY (EGD) WITH PROPOFOL N/A 04/16/2015   Procedure: ESOPHAGOGASTRODUODENOSCOPY (EGD) WITH PROPOFOL;  Surgeon: [name], MD;  Location: [facility] ENDOSCOPY;  Service: Endoscopy;  Laterality: N/A;  . ESOPHAGOGASTRODUODENOSCOPY (EGD) WITH PROPOFOL N/A 08/24/2017   Procedure: ESOPHAGOGASTRODUODENOSCOPY (EGD) WITH PROPOFOL;  Surgeon: [name], MD;  Location: [facility]COPY;  Service: Endoscopy;  Laterality: N/A;  . FRACTURE SURGERY Left    ORIF lower leg  . GIVENS CAPSULE STUDY N/A 08/03/2020   Procedure: GIVENS CAPSULE STUDY;  Surgeon: Jonathon Bellows, MD;  Location: [facility] ENDOSCOPY;  Service: Gastroenterology;  Laterality: N/A;  . HERNIA REPAIR    . HIP PINNING,CANNULATED Left 07/22/2018   Procedure: CANNULATED HIP PINNING;  Surgeon: [name], MD;  Location: [place];  Service: Orthopedics;  Laterality: Left;  . MITRAL VALVE REPAIR N/A 12/30/2018   Procedure: MITRAL VALVE REPAIR;  Surgeon: [name], MD;  Location: [place] CV LAB;  Service: Cardiovascular;  Laterality: N/A;  . RIGHT/LEFT HEART CATH AND CORONARY/GRAFT ANGIOGRAPHY  N/A 12/21/2018   Procedure: RIGHT/LEFT HEART CATH AND CORONARY/GRAFT ANGIOGRAPHY;  Surgeon: [name], MD;  Location: [place] [facility] LAB;  Service: Cardiovascular;  Laterality: N/A;  . TEE WITHOUT CARDIOVERSION N/A 08/03/2014   Procedure: TRANSESOPHAGEAL ECHOCARDIOGRAM (TEE);  Surgeon: [name], MD;  Location: [place];  Service: Open Heart Surgery;  Laterality: N/A;  . TEE WITHOUT CARDIOVERSION N/A 02/10/2018   Procedure: TRANSESOPHAGEAL ECHOCARDIOGRAM (TEE);  Surgeon: [name], MD;  Location: [facility] ENDOSCOPY;  Service: Cardiovascular;  Laterality: N/A;  . TEE WITHOUT CARDIOVERSION N/A 11/09/2019   Procedure: TRANSESOPHAGEAL ECHOCARDIOGRAM (TEE);  Surgeon: [name], MD;  Location: [facility] ENDOSCOPY;  Service: Cardiovascular;  Laterality: N/A;    Prior to Admission medications   Medication Sig Start Date End Date Taking? Authorizing Provider  acetaminophen (TYLENOL) 500 MG tablet Take 500-1,000 mg by mouth every 6 (six) hours as needed (for pain.).    [provider]  allopurinol (ZYLOPRIM) 100 MG tablet Take 100 mg by mouth daily.  02/08/19   [provider]  amiodarone (PACERONE) 200 MG tablet Take 1 tablet (200 mg total) by mouth daily. 04/13/20   Bensimhon, Shaune Pascal, MD  apixaban (ELIQUIS) 2.5 MG TABS tablet Take 1 tablet (2.5 mg total) by mouth 2 (two) times daily. Patient taking differently: Take 2.5 mg by mouth 2 (two) times daily. 1/2 tablet twice daily 08/20/20   [name], MD  Ascorbic Acid (VITAMIN C) 100 MG tablet Take 100 mg by mouth daily.    [provider]  atorvastatin (LIPITOR) 40 MG tablet TAKE 1 TABLET (40 MG TOTAL) BY MOUTH DAILY AT 6 PM. 08/27/20   [name], MD  carvedilol (COREG) 3.125 MG tablet TAKE 1 TABLET (3.125 MG TOTAL) BY MOUTH 2 (TWO) TIMES DAILY. 10/01/20   [name], MD  Cholecalciferol 50 MCG (2000 UT) TABS Take 2,000 Units by mouth at bedtime.     [provider]  Cyanocobalamin (VITAMIN B 12)  500 MCG TABS Take by mouth.    [provider]  cyanocobalamin 500 MCG tablet Take 500 mcg by mouth daily.  Patient not taking: Reported on 09/19/2020    [provider]  docusate sodium (COLACE) 100 MG capsule Take 100 mg by mouth daily as needed for mild constipation.    [provider]  DULoxetine (CYMBALTA) 30 MG capsule Take 30 mg by mouth at bedtime.     [provider]  ferrous sulfate 325 (65 FE) MG tablet Take 1 tablet (325 mg total) by mouth daily with breakfast. 02/09/18   [name], MD  gabapentin (NEURONTIN) 300 MG capsule Take 300 mg by mouth daily. 08/23/20   [provider]  ketoconazole (NIZORAL) 2 % shampoo Apply 1 application topically 2 (two) times a week. Mon and Thu 09/28/18   [provider]  lactulose (CHRONULAC) 10 GM/15ML solution Take 30 mLs (20 g total) by mouth daily. 07/05/20   Gaylan Gerold, DO  metolazone (ZAROXOLYN) 2.5 MG tablet Take 1 tablet twice  a week, every Monday and Thursday. Take 20 mEq Potassium with Metolazone. 07/30/20   [name], MD  Multiple Vitamin (MULTIVITAMIN) tablet Take 1 tablet by mouth at bedtime.     [provider]  Nutritional Supplements (ENSURE COMPLETE SHAKE) LIQD Take 1 Can by mouth 3 (three) times daily. Between meals 08/27/20   Lyda Jester M, PA-C  pantoprazole (PROTONIX) 40 MG tablet TAKE 1 TABLET BY MOUTH EVERY DAY 08/06/20   [name], MD  potassium chloride SA (KLOR-CON M20) 20 MEQ tablet Take 1 tablet (20 mEq total) by mouth daily. 06/06/20   [name], MD  sucralfate (CARAFATE) 1 g tablet Take 1 tablet (1 g total) by mouth 2 (two) times daily. 07/05/20   Gaylan Gerold, DO  torsemide (DEMADEX) 20 MG tablet Take 4 tablets (80 mg total) by mouth every morning AND 2 tablets (40 mg total) every evening. 10/03/20   [name], MD    Allergies Spironolactone  Family History  Problem Relation Age of Onset  . Valvular heart disease Mother         Ruptured valve  . CAD Father   . Heart Problems Brother        Stents x 4  . Diabetes Brother   . Prostate cancer Neg Hx   . Bladder Cancer Neg Hx   . Kidney cancer Neg Hx     Social History Social History   Tobacco Use  . Smoking status: Former Smoker    Quit date: 2014    Years since quitting: 8.1  . Smokeless tobacco: Never Used  . Tobacco comment: 07/30/2017 Quit in 2014  Vaping Use  . Vaping Use: Never used  Substance Use Topics  . Alcohol use: No    Alcohol/week: 0.0 standard drinks  . Drug use: No    Review of Systems  Patient tells me he feels fatigued no pain or discomfort.  Had blood in his stool recently cannot quite tell me exactly when.  No chest pain or trouble breathing.  Denies abdominal pain.  Reports he just feels very weak    ____________________________________________   PHYSICAL EXAM:  VITAL SIGNS: ED Triage Vitals  Enc Vitals Group     BP 10/08/20 1234 (!) 53/32     Pulse Rate 10/08/20 1232 (!) 40     Resp 10/08/20 1232 16     Temp 10/08/20 1232 98 F (36.7 C)     Temp Source 10/08/20 1232 Axillary     SpO2 10/08/20 1232 96 %     Weight 10/08/20 1233 154 lb 5.2 oz (70 kg)     Height 10/08/20 1233 5\' 10"  (1.778 m)     Head Circumference --      Peak Flow --      Pain Score 10/08/20 1232 0     Pain Loc --      Pain Edu? --      Excl. in [place]? --     Constitutional: Alert and oriented to self and place but not well oriented to situation.  He seems to have mild cognitive slowing or impairment versus encephalopathy.  Does not exhibit any picking or fidgeting behaviors though.  No psychomotor agitation noted. Eyes: Conjunctivae are normal. Head: Atraumatic. Nose: No congestion/rhinnorhea. Mouth/Throat: Mucous membranes are very dry and he has dark almost dried blood-like appearance on the posterior tongue. Neck: No stridor.  No JVD. Cardiovascular: Bradycardic rate, regular rhythm. Grossly normal heart sounds.  Good peripheral  circulation.  Respiratory: Normal respiratory effort.  No retractions. Lungs CTAB. Gastrointestinal: Soft and nontender. No distention. Musculoskeletal: No lower extremity tenderness nor edema. Neurologic:  Normal speech and language was soft-spoken and fatigued. No gross focal neurologic deficits are appreciated.  Skin:  Skin is warm, dry and intact. No rash noted. Psychiatric: Mood and affect are rather flat.  Speech is clear.  Very hard of hearing to the point he almost has to read lips. ____________________________________________   LABS (all labs ordered are listed, but only abnormal results are displayed)  Labs Reviewed  COMPREHENSIVE METABOLIC PANEL - Abnormal; Notable for the following components:      Result Value   Sodium 122 (*)    Potassium 6.0 (*)    Chloride 89 (*)    CO2 21 (*)    BUN 106 (*)    Creatinine, Ser 4.90 (*)    Calcium 8.1 (*)    Total Protein 5.9 (*)    Albumin 2.7 (*)    Alkaline Phosphatase 129 (*)    GFR, Estimated 12 (*)    All other components within normal limits  CBC WITH DIFFERENTIAL/PLATELET - Abnormal; Notable for the following components:   RBC 2.07 (*)    Hemoglobin 6.6 (*)    HCT 21.3 (*)    MCV 102.9 (*)    RDW 18.0 (*)    Lymphs Abs 0.6 (*)    All other components within normal limits  PROTIME-INR - Abnormal; Notable for the following components:   Prothrombin Time 18.5 (*)    INR 1.6 (*)    All other components within normal limits  APTT - Abnormal; Notable for the following components:   aPTT 48 (*)    All other components within normal limits  BRAIN NATRIURETIC PEPTIDE - Abnormal; Notable for the following components:   B Natriuretic Peptide 564.4 (*)    All other components within normal limits  RESP PANEL BY RT-PCR (FLU A&B, COVID) ARPGX2  CULTURE, BLOOD (ROUTINE X 2)  CULTURE, BLOOD (ROUTINE X 2)  URINE CULTURE  SARS CORONAVIRUS 2 (TAT 6-24 HRS)  LACTIC ACID, PLASMA  LACTIC ACID, PLASMA  URINALYSIS, COMPLETE (UACMP)  WITH MICROSCOPIC  BASIC METABOLIC PANEL  TYPE AND SCREEN  PREPARE RBC (CROSSMATCH)  TROPONIN I (HIGH SENSITIVITY)   ____________________________________________  EKG  Reviewed inter my at 1230 Heart rate 40 QRS 190 QTc 550 Wide-complex slow bradycardia with paced ventricular rhythm.  No obvious ischemia, but EKG is concerning for potential underlying electrolyte abnormality or felt less likely ischemia especially without chest pain.  No STEMI ____________________________________________  RADIOLOGY  DG Chest Port 1 View  Result Date: 10/08/2020 CLINICAL DATA:  Sepsis EXAM: PORTABLE CHEST 1 VIEW COMPARISON:  08/12/2020 FINDINGS: Cardiac shadow is enlarged but stable. Defibrillator and postsurgical changes are again noted. Old rib fractures are noted on the right with healing and callus formation. Previously seen vascular congestion has improved. No focal infiltrate is noted. IMPRESSION: Improved vascular congestion and edema when compare with the prior exam. No focal infiltrate is seen. Electronically Signed   By: Inez Catalina M.D.   On: 10/08/2020 13:13    Chest x-ray reviewed negative for acute findings.  Decreased vascular congestion and edema compared with previous. ____________________________________________   PROCEDURES  Procedure(s) performed: None  Procedures  Critical Care performed: Yes, see critical care note(s)  CRITICAL CARE Performed by: Delman Kitten   Total critical care time: 70 minutes  Critical care time was exclusive of separately billable procedures and treating other patients.  Critical care was necessary to treat or prevent imminent or life-threatening deterioration.  Critical care was time spent personally by me on the following activities: development of treatment plan with patient and/or surrogate as well as nursing, discussions with consultants, evaluation of patient's response to treatment, examination of patient, obtaining history from patient or  surrogate, ordering and performing treatments and interventions, ordering and review of laboratory studies, ordering and review of radiographic studies, pulse oximetry and re-evaluation of patient's condition.  ____________________________________________   INITIAL IMPRESSION / ASSESSMENT AND PLAN / ED COURSE  Pertinent labs & imaging results that were available during my care of the patient were reviewed by me and considered in my medical decision making (see chart for details).   Patient presents with severe hypotension and bradycardia this in the setting of recent concerns for possible GI bleeding as well as evidently increased diuretic use for concerns of increased fluid levels on home monitoring.  He appears extremely dry and hypovolemic on presentation.  He is not febrile but certainly there is report of treatment for possible UTI starting through his health nurse the last couple of days as well and I will cover empirically with antibiotics.  Differential diagnosis extremely broad.  Clinical Course as of 10/08/20 1445  Mon Oct 08, 2020  1230 Cardiology increased torsemide last week. Now also had a recent UTI diagnosed and started on abx ( home health). Discussed with community paramedic. Working to hydrate at home. Per community paramedic worsening weakness through last couple days. They suspect severe dehydration may be present.  [MQ]  [place] (listed brother), no answer and no voicemail setup [MQ]  1301 Awaiting arrival of family to bedside, we did discuss with the patient and he does indicate that he is DO NOT RESUSCITATE and his previous hospitalization indicates same.  Unable to reach out to [name] his brother who he reports is his Research scientist (physical sciences), but he does report his daughter and sister are supposed to be getting here shortly.  Patient remains awake and alert without pain, reports he just feels fatigued [MQ]  1335 Blood pressure is improved to 76 systolic.  At  this point I suspect most likely cause of severe hypotension to be GI bleeding, have placed consultation with Dr. Haig Prophet of GI as well as consultations to Dr. Patricia Pesa of ICU team.  We will start low-dose peripheral Levophed as we continue fluid resuscitation and support and await blood product arrival.  Consultation also pending from cardiology.  The patient will obviously need consults for multiple services for coordination of care. [MQ]  [place] ICU physician, Dr. Stoney Bang has called back.  He will see in consult on the patient anticipate likely admission to the ICU team.  Additionally, GI Dr. Haig Prophet will see in consult on patient for concerns of possible GI bleeding.   [MQ]  1413 Nursing checking to see if family in the waiting room, at this point unable to contact any family members. [MQ]  1413 Noted, remains hypotensive patient alert or despite fluid resuscitation and use of peripheral pressors, uptitrating Levophed now and additional fluid boluses as we await blood product.  Blood pressure MAP 47 systolic 61. [MQ]  1610 Waiting room just check, no family members have arrived.  Patient unable to recall his sisters home phone number.  Does not know his daughter's number.  We do have the phone number for his brother [name] but unfortunately he reports [name] is currently in surgery. [MQ]  1414   Patient  does however affirm DO NOT RESUSCITATE status and we will certainly honor this which is also noted in his previous discharge summary. [MQ]    Clinical Course User Index [MQ] Delman Kitten, MD   ----------------------------------------- 2:36 PM on 10/08/2020 -----------------------------------------  Patient's daughter at bedside, she is his next kin.  He is not currently married and she reports he does not actually have an Animal nutritionist power of attorney.  She does report however that he does have a DO NOT RESUSCITATE and wishes to honor that wish as well  Patient currently pending admission to  the ICU, blood pressure improving 45/62 systolic improving maps, currently just about to start blood products.  Of also discussed with nephrology and Dr. Abigail Butts will see patient shortly as well.  ----------------------------------------- 2:45 PM on 10/08/2020 -----------------------------------------  Dr. Lake Bells of ICU team currently the bedside seen and evaluated the patient discussing with his daughter and patient.  At this point plan is to admit to the intensive care unit from the care and treatment.  The patient is starting to show improvement with regard to heart rate and bradycardia/hypotension at this point.  Appears to be severely hypovolemic possibly secondary to GI bleeding and dehydration in the setting of heavy diuretic use.  Patient being admitted, further care and treatment under the ICU team.  Pending consults at this point all firmed by the specialist will perform them including GI, critical care medicine, cardiology, and nephrology.  ____________________________________________   FINAL CLINICAL IMPRESSION(S) / ED DIAGNOSES  Final diagnoses:  AKI (acute kidney injury) ([place])  Dehydration, severe  Acute GI bleeding  Hyperkalemia  Bradycardia  Hypovolemic shock ([place])        Note:  This document was prepared using Dragon voice recognition software and may include unintentional dictation errors       Delman Kitten, MD 10/08/20 1447

## 2020-10-08 NOTE — Consult Note (Signed)
[facility]  CONSULT NOTE    Date: 10/08/2020                  Patient Name:  Carl [name]  MRN: [id number]  DOB: 10/24/1950  Age / Sex: 70 y.o., male         PCP: [name], MD                 Service Requesting Consult: Dr. Jacqualine Code                 Reason for Consult: Acute kidney injury            History of Present Illness: Carl [name] admitted to [facility] for fatigue and weakness. Patient is hard of hearing and history taken from daughter who is at bedside. Patient recent treated for UTI and acute exacerbation of congestive heart failure. Found to be hypotensive and placed on peripheral IV norepinephrine.  Patient found to have GI bleed with hemoccult positive stools. Gi and cardiology are recommending palliative care.    Medications: Outpatient medications: Medications Prior to Admission  Medication Sig Dispense Refill Last Dose  . acetaminophen (TYLENOL) 500 MG tablet Take 500-1,000 mg by mouth every 6 (six) hours as needed (for pain.).     Marland Kitchen allopurinol (ZYLOPRIM) 100 MG tablet Take 100 mg by mouth daily.      Marland Kitchen amiodarone (PACERONE) 200 MG tablet Take 1 tablet (200 mg total) by mouth daily. 90 tablet 1   . apixaban (ELIQUIS) 2.5 MG TABS tablet Take 1 tablet (2.5 mg total) by mouth 2 (two) times daily. (Patient taking differently: Take 2.5 mg by mouth 2 (two) times daily. 1/2 tablet twice daily) 60 tablet 0   . Ascorbic Acid (VITAMIN C) 100 MG tablet Take 100 mg by mouth daily.     Marland Kitchen atorvastatin (LIPITOR) 40 MG tablet TAKE 1 TABLET (40 MG TOTAL) BY MOUTH DAILY AT 6 PM. 90 tablet 3   . carvedilol (COREG) 3.125 MG tablet TAKE 1 TABLET (3.125 MG TOTAL) BY MOUTH 2 (TWO) TIMES DAILY. 180 tablet 3   . Cholecalciferol 50 MCG (2000 UT) TABS Take 2,000 Units by mouth at bedtime.      . Cyanocobalamin (VITAMIN B 12) 500 MCG TABS Take by mouth.     . cyanocobalamin 500 MCG tablet Take 500 mcg by mouth daily.  (Patient not taking: Reported on  09/19/2020)     . docusate sodium (COLACE) 100 MG capsule Take 100 mg by mouth daily as needed for mild constipation.     . DULoxetine (CYMBALTA) 30 MG capsule Take 30 mg by mouth at bedtime.      . ferrous sulfate 325 (65 FE) MG tablet Take 1 tablet (325 mg total) by mouth daily with breakfast. 30 tablet 3   . gabapentin (NEURONTIN) 300 MG capsule Take 300 mg by mouth daily.     Marland Kitchen ketoconazole (NIZORAL) 2 % shampoo Apply 1 application topically 2 (two) times a week. Mon and Thu     . lactulose (CHRONULAC) 10 GM/15ML solution Take 30 mLs (20 g total) by mouth daily. 236 mL 0   . metolazone (ZAROXOLYN) 2.5 MG tablet Take 1 tablet twice a week, every Monday and Thursday. Take 20 mEq Potassium with Metolazone. 24 tablet 3   . Multiple Vitamin (MULTIVITAMIN) tablet Take 1 tablet by mouth at bedtime.      . Nutritional Supplements (ENSURE COMPLETE SHAKE) LIQD Take 1 Can by mouth  3 (three) times daily. Between meals 237 mL 11   . pantoprazole (PROTONIX) 40 MG tablet TAKE 1 TABLET BY MOUTH EVERY DAY 90 tablet 2   . potassium chloride SA (KLOR-CON M20) 20 MEQ tablet Take 1 tablet (20 mEq total) by mouth daily. 30 tablet 3   . sucralfate (CARAFATE) 1 g tablet Take 1 tablet (1 g total) by mouth 2 (two) times daily.     Marland Kitchen torsemide (DEMADEX) 20 MG tablet Take 4 tablets (80 mg total) by mouth every morning AND 2 tablets (40 mg total) every evening. 540 tablet 2     Current medications: Current Facility-Administered Medications  Medication Dose Route Frequency Provider Last Rate Last Admin  . 0.9 %  sodium chloride infusion  250 mL Intravenous Continuous Simonne Maffucci B, MD 10 mL/hr at 10/08/20 1744 250 mL at 10/08/20 1744  . 0.9 %  sodium chloride infusion   Intravenous Continuous Jocsan Mcginley, MD      . amiodarone (PACERONE) tablet 200 mg  200 mg Oral Daily Simonne Maffucci B, MD   200 mg at 10/08/20 1743  . atorvastatin (LIPITOR) tablet 40 mg  40 mg Oral Daily Simonne Maffucci B, MD   40 mg at  10/08/20 1742  . Chlorhexidine Gluconate Cloth 2 % PADS 6 each  6 each Topical Daily Juanito Doom, MD   6 each at 10/08/20 1716  . docusate sodium (COLACE) capsule 100 mg  100 mg Oral BID PRN Simonne Maffucci B, MD   100 mg at 10/08/20 1742  . MEDLINE mouth rinse  15 mL Mouth Rinse BID Simonne Maffucci B, MD      . norepinephrine (LEVOPHED) 4mg  in 29mL premix infusion  2-10 mcg/min Intravenous Titrated Simonne Maffucci B, MD 30 mL/hr at 10/08/20 1718 8 mcg/min at 10/08/20 1718  . ondansetron (ZOFRAN) injection 4 mg  4 mg Intravenous Q6H PRN Simonne Maffucci B, MD      . pantoprazole (PROTONIX) EC tablet 40 mg  40 mg Oral Daily Simonne Maffucci B, MD   40 mg at 10/08/20 1743  . polyethylene glycol (MIRALAX / GLYCOLAX) packet 17 g  17 g Oral Daily PRN Juanito Doom, MD          Allergies: Allergies  Allergen Reactions  . Spironolactone Other (See Comments)    gynecomastia       Past Medical History: Past Medical History:  Diagnosis Date  . AICD (automatic cardioverter/defibrillator) present   . Anemia   . Anxiety   . Arthritis   . Brunner's gland hyperplasia of duodenum   . CHF (congestive heart failure) ([place])   . Chronic kidney disease   . Cirrhosis of liver ([place])   . Coronary artery disease   . Depression   . Dysrhythmia    atrial fib  . External hemorrhoids   . Fracture of femoral neck, left ([place]) 07/21/2018  . GERD (gastroesophageal reflux disease)   . Gout   . Hearing loss   . Heart murmur    mirtal valve insufficiency  . Hepatic failure ([place])   . HH (hiatus hernia)   . Hyperlipidemia   . Hypokalemia   . Hyposmolality   . Intellectual disability    mild  . Internal hemorrhoids   . Ischemic cardiomyopathy   . Leg fracture, left   . Myocardial infarction ([place])   . Osteoporosis   . Paronychia   . Peripheral vascular disease ([place])   . Pneumonia   . Psoriasis   .  Psoriasis   . PUD (peptic ulcer disease)   . Schatzki's ring   . Tubular adenoma of  colon   . Vitiligo      Past Surgical History: Past Surgical History:  Procedure Laterality Date  . ATRIAL SEPTAL DEFECT(ASD) CLOSURE N/A 12/30/2018   Procedure: ATRIAL SEPTAL DEFECT(ASD) CLOSURE;  Surgeon: [name], MD;  Location: [place] [facility];  Service: Cardiovascular;  Laterality: N/A;  . CARDIAC SURGERY     ICD  . CARDIOVERSION N/A 11/09/2019   Procedure: CARDIOVERSION;  Surgeon: [name], MD;  Location: [facility] ENDOSCOPY;  Service: Cardiovascular;  Laterality: N/A;  . CARDIOVERSION N/A 12/21/2019   Procedure: CARDIOVERSION;  Surgeon: [name], MD;  Location: [place];  Service: Cardiovascular;  Laterality: N/A;  . CATARACT EXTRACTION W/PHACO Right 07/26/2020   Procedure: CATARACT EXTRACTION PHACO AND INTRAOCULAR LENS PLACEMENT ([place]) RIGHT VISION BLUE;  Surgeon: Birder Robson, MD;  Location: [facility] ORS;  Service: Ophthalmology;  Laterality: Right;  [place] 01:14.1 CDE 13.65 Fluid Pack Lot # [id number]  . COLONOSCOPY WITH ESOPHAGOGASTRODUODENOSCOPY (EGD)    . COLONOSCOPY WITH PROPOFOL N/A 08/24/2017   Procedure: COLONOSCOPY WITH PROPOFOL;  Surgeon: [name], MD;  Location: [facility] ENDOSCOPY;  Service: Endoscopy;  Laterality: N/A;  . COLONOSCOPY WITH PROPOFOL N/A 07/23/2020   Procedure: COLONOSCOPY WITH PROPOFOL;  Surgeon: Toledo, [name], MD;  Location: [facility] ENDOSCOPY;  Service: Gastroenterology;  Laterality: N/A;  . CORONARY ARTERY BYPASS GRAFT N/A 08/03/2014   Procedure: CORONARY ARTERY BYPASS GRAFTING (CABG);  Surgeon: [name], MD;  Location: [place];  Service: Open Heart Surgery;  Laterality: N/A;  . ENTEROSCOPY N/A 08/06/2020   Procedure: ENTEROSCOPY;  Surgeon: Jonathon Bellows, MD;  Location: [facility] ENDOSCOPY;  Service: Gastroenterology;  Laterality: N/A;  . EP IMPLANTABLE DEVICE N/A 05/01/2015   MDT ICD implanted for primary prevention of sudden death  . ESOPHAGOGASTRODUODENOSCOPY N/A 06/28/2020   Procedure: ESOPHAGOGASTRODUODENOSCOPY (EGD);  Surgeon:  Clarene Essex, MD;  Location: [place];  Service: Endoscopy;  Laterality: N/A;  . ESOPHAGOGASTRODUODENOSCOPY (EGD) WITH PROPOFOL N/A 04/16/2015   Procedure: ESOPHAGOGASTRODUODENOSCOPY (EGD) WITH PROPOFOL;  Surgeon: [name], MD;  Location: [facility] ENDOSCOPY;  Service: Endoscopy;  Laterality: N/A;  . ESOPHAGOGASTRODUODENOSCOPY (EGD) WITH PROPOFOL N/A 08/24/2017   Procedure: ESOPHAGOGASTRODUODENOSCOPY (EGD) WITH PROPOFOL;  Surgeon: [name], MD;  Location: [facility] ENDOSCOPY;  Service: Endoscopy;  Laterality: N/A;  . FRACTURE SURGERY Left    ORIF lower leg  . GIVENS CAPSULE STUDY N/A 08/03/2020   Procedure: GIVENS CAPSULE STUDY;  Surgeon: Jonathon Bellows, MD;  Location: [facility] ENDOSCOPY;  Service: Gastroenterology;  Laterality: N/A;  . HERNIA REPAIR    . HIP PINNING,CANNULATED Left 07/22/2018   Procedure: CANNULATED HIP PINNING;  Surgeon: [name], MD;  Location: [place];  Service: Orthopedics;  Laterality: Left;  . MITRAL VALVE REPAIR N/A 12/30/2018   Procedure: MITRAL VALVE REPAIR;  Surgeon: [name], MD;  Location: [place] [facility];  Service: Cardiovascular;  Laterality: N/A;  . RIGHT/LEFT HEART CATH AND CORONARY/GRAFT ANGIOGRAPHY N/A 12/21/2018   Procedure: RIGHT/LEFT HEART CATH AND CORONARY/GRAFT ANGIOGRAPHY;  Surgeon: [name], MD;  Location: [place] [facility];  Service: Cardiovascular;  Laterality: N/A;  . TEE WITHOUT CARDIOVERSION N/A 08/03/2014   Procedure: TRANSESOPHAGEAL ECHOCARDIOGRAM (TEE);  Surgeon: [name], MD;  Location: [place];  Service: Open Heart Surgery;  Laterality: N/A;  . TEE WITHOUT CARDIOVERSION N/A 02/10/2018   Procedure: TRANSESOPHAGEAL ECHOCARDIOGRAM (TEE);  Surgeon: [name], MD;  Location: [facility] ENDOSCOPY;  Service: Cardiovascular;  Laterality: N/A;  . TEE WITHOUT CARDIOVERSION N/A 11/09/2019   Procedure: TRANSESOPHAGEAL ECHOCARDIOGRAM (TEE);  Surgeon: [name], MD;  Location: [facility] ENDOSCOPY;  Service: Cardiovascular;   Laterality: N/A;     Family History: Family History  Problem Relation Age of Onset  . Valvular heart disease Mother        Ruptured valve  . CAD Father   . Heart Problems Brother        Stents x 4  . Diabetes Brother   . Prostate cancer Neg Hx   . Bladder Cancer Neg Hx   . Kidney cancer Neg Hx      Social History: Social History   Socioeconomic History  . Marital status: Single    Spouse name: Not on file  . Number of children: Not on file  . Years of education: Not on file  . Highest education level: Not on file  Occupational History  . Not on file  Tobacco Use  . Smoking status: Former Smoker    Quit date: 2014    Years since quitting: 8.1  . Smokeless tobacco: Never Used  . Tobacco comment: 07/30/2017 Quit in 2014  Vaping Use  . Vaping Use: Never used  Substance and Sexual Activity  . Alcohol use: No    Alcohol/week: 0.0 standard drinks  . Drug use: No  . Sexual activity: Never  Other Topics Concern  . Not on file  Social History Narrative   Pt lives in [place] with his mother.   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 rate 14, height 5\' 10"  (1.778 m), weight 64.1 kg, SpO2 100 %.  Weight trends: Filed Weights   10/08/20 1233 10/08/20 1653  Weight: 70 kg 64.1 kg    Physical Exam: General: Critically ill  Hearing Hearing loss  Head: Normocephalic, atraumatic. Moist oral mucosal membranes  Eyes: Anicteric, PERRL  Neck: Supple, trachea midline  Lungs:  Clear to auscultation  Heart: Regular rate and rhythm  Abdomen:  Soft, nontender,   Extremities:  no  peripheral edema.  Neurologic: Alert to self  Skin: No lesions  Access: none     Lab results: Basic Metabolic Panel: Recent Labs  Lab 10/08/20 1244 10/08/20 1435  NA 122* 124*  K 6.0* 5.8*  CL 89* 91*  CO2 21* 22  GLUCOSE 90 88  BUN 106* 102*  CREATININE 4.90* 4.73*  CALCIUM 8.1* 8.1*    Liver Function Tests: Recent Labs  Lab 10/08/20 1244  AST 27  ALT 19  ALKPHOS 129*  BILITOT 0.8  PROT 5.9*  ALBUMIN 2.7*   No results for input(s): LIPASE, AMYLASE in the last 168 hours. No results for input(s): AMMONIA in the last 168 hours.  CBC: Recent Labs  Lab 10/08/20 1244  WBC 6.0  NEUTROABS 4.4  HGB 6.6*  HCT 21.3*  MCV 102.9*  PLT 356    Cardiac Enzymes: No results for input(s): CKTOTAL, CKMB, CKMBINDEX, TROPONINI in the last 168 hours.  BNP: Invalid input(s): POCBNP  CBG: Recent Labs  Lab 10/08/20 1619 10/08/20 1651  GLUCAP 87 66    Microbiology: Results for orders placed or performed during the hospital encounter of 10/08/20  Resp Panel by RT-PCR (Flu A&B, Covid) Nasopharyngeal Swab     Status: None   Collection Time: 10/08/20 12:44 PM   Specimen: Nasopharyngeal Swab; Nasopharyngeal(NP) swabs in vial transport medium  Result Value Ref Range Status   SARS Coronavirus 2 by RT PCR NEGATIVE NEGATIVE Final    Comment: (NOTE) SARS-CoV-2 target nucleic acids are NOT DETECTED.  The SARS-CoV-2 RNA is generally detectable in upper respiratory specimens during the acute phase of infection. The lowest concentration of SARS-CoV-2 viral copies this assay can detect is 138 copies/mL. A negative result does not preclude SARS-Cov-2 infection and should not be used as the sole basis for treatment or other patient management decisions. A negative result may occur with  improper specimen collection/handling, submission of specimen other than nasopharyngeal swab, presence of viral mutation(s) within the areas targeted by this assay, and inadequate number of  viral copies(<138 copies/mL). A negative result must be combined with clinical observations, patient history, and epidemiological information. The expected result is Negative.  Fact Sheet for Patients:  EntrepreneurPulse.com.au  Fact Sheet for Healthcare Providers:  IncredibleEmployment.be  This test is no t yet approved or cleared by the [place] FDA and  has been authorized for detection and/or diagnosis of SARS-CoV-2 by FDA under an Emergency Use Authorization (EUA). This EUA will remain  in effect (meaning this test can be used) for the duration of the COVID-19 declaration under Section 564(b)(1) of the Act, 21 U.S.C.section 360bbb-3(b)(1), unless the authorization is terminated  or revoked sooner.       Influenza A by PCR NEGATIVE NEGATIVE Final   Influenza B by PCR NEGATIVE NEGATIVE Final    Comment: (NOTE) The Xpert Xpress SARS-CoV-2/FLU/RSV plus assay is intended as an aid in the diagnosis of influenza from Nasopharyngeal swab specimens and should not be used as a sole basis for treatment. Nasal washings and aspirates are unacceptable for Xpert Xpress SARS-CoV-2/FLU/RSV testing.  Fact Sheet for Patients: EntrepreneurPulse.com.au  Fact Sheet for Healthcare Providers: IncredibleEmployment.be  This test is not yet approved or cleared by the [place] FDA and has been authorized for detection and/or diagnosis of SARS-CoV-2 by FDA under an Emergency Use Authorization (EUA). This EUA will remain in effect (meaning this test can be used) for the duration of the COVID-19 declaration under Section 564(b)(1) of the Act, 21 U.S.C. section 360bbb-3(b)(1), unless the authorization is terminated or revoked.  Performed at [facility], [address] [place], [place] [address]   MRSA PCR Screening     Status: None   Collection Time: 10/08/20  4:53 PM   Specimen: Nasopharyngeal  Result  Value Ref Range Status   MRSA by PCR NEGATIVE NEGATIVE Final    Comment:        The GeneXpert MRSA Assay (FDA approved for NASAL specimens only), is one component of a comprehensive MRSA colonization surveillance program. It is not intended to diagnose MRSA infection nor to guide or monitor treatment for MRSA infections. Performed at [facility], [address] [place], [place] [address]    *Note: Due to a large number of results and/or encounters for the requested time period, some results have not been displayed. A complete set of results can be found in Results Review.    Coagulation Studies: Recent Labs    10/08/20 1244  LABPROT 18.5*  INR 1.6*    Urinalysis: No results for  input(s): COLORURINE, LABSPEC, PHURINE, GLUCOSEU, HGBUR, BILIRUBINUR, KETONESUR, PROTEINUR, UROBILINOGEN, NITRITE, LEUKOCYTESUR in the last 72 hours.  Invalid input(s): APPERANCEUR    Imaging: DG Chest Port 1 View  Result Date: 10/08/2020 CLINICAL DATA:  Sepsis EXAM: PORTABLE CHEST 1 VIEW COMPARISON:  08/12/2020 FINDINGS: Cardiac shadow is enlarged but stable. Defibrillator and postsurgical changes are again noted. Old rib fractures are noted on the right with healing and callus formation. Previously seen vascular congestion has improved. No focal infiltrate is noted. IMPRESSION: Improved vascular congestion and edema when compare with the prior exam. No focal infiltrate is seen. Electronically Signed   By: Inez Catalina M.D.   On: 10/08/2020 13:13      Assessment & Plan: Carl [name] is a 70 y.o. white male with coronary artery disease status post mitral valve repair, hepatic cirrhosis, congestive heart failure, AICD placement, hypertension, hyperlipidemia, peripheral vascular disease, peptic ulcer disease, hearing loss, who was admitted to [facility] on 10/08/2020 for Hypovolemic shock ([place]) [R57.1] Hyperkalemia [E87.5] Bradycardia [R00.1] Shock circulatory ([place]) [R57.9] Acute GI  bleeding [K92.2] Dehydration, severe [E86.0] AKI (acute kidney injury) ([place]) [N17.9]  1. Acute kidney injury: with hyperkalemia: on chronic kidney disease stage IIIB. Baseline creatinine of 2.2 with GFR of 32 on 07/03/20. History of bland urine.  - hold potassium chloride.  - renal ultrasound - place foley catheter - no acute indication for dialysis. Patient is documented as stating he was not interested in dialysis. Will not offer dialysis due to patient's overall poor prognosis  2. Hyponatremia: acute on chronic: on home regimen includes metolazone - IV normal saline  3. Anemia with acute kidney failure: macrocytic. Hemoglobin 6.6. PRBC transfusion      LOS: 0 Shontae Rosiles 2/14/20227:04 PM

## 2020-10-08 NOTE — Consult Note (Signed)
PHARMACY -  BRIEF ANTIBIOTIC NOTE   Pharmacy has received consult(s) for cefepime from an ED provider.  The patient's profile has been reviewed for ht/wt/allergies/indication/available labs.    One time order(s) placed for cefepime 2 g IV   Further antibiotics/pharmacy consults should be ordered by admitting physician if indicated.                       Thank you, Darnelle Bos 10/08/2020  12:57 PM

## 2020-10-09 ENCOUNTER — Inpatient Hospital Stay: Payer: Medicare Other

## 2020-10-09 DIAGNOSIS — R579 Shock, unspecified: Secondary | ICD-10-CM | POA: Diagnosis not present

## 2020-10-09 DIAGNOSIS — I5022 Chronic systolic (congestive) heart failure: Secondary | ICD-10-CM | POA: Diagnosis not present

## 2020-10-09 LAB — CBC
HCT: 30.6 % — ABNORMAL LOW (ref 39.0–52.0)
Hemoglobin: 10.1 g/dL — ABNORMAL LOW (ref 13.0–17.0)
MCH: 31.2 pg (ref 26.0–34.0)
MCHC: 33 g/dL (ref 30.0–36.0)
MCV: 94.4 fL (ref 80.0–100.0)
Platelets: 381 10*3/uL (ref 150–400)
RBC: 3.24 MIL/uL — ABNORMAL LOW (ref 4.22–5.81)
RDW: 19.3 % — ABNORMAL HIGH (ref 11.5–15.5)
WBC: 7.6 10*3/uL (ref 4.0–10.5)
nRBC: 0 % (ref 0.0–0.2)

## 2020-10-09 LAB — BASIC METABOLIC PANEL
Anion gap: 12 (ref 5–15)
Anion gap: 10 (ref 5–15)
BUN: 102 mg/dL — ABNORMAL HIGH (ref 8–23)
BUN: 95 mg/dL — ABNORMAL HIGH (ref 8–23)
CO2: 25 mmol/L (ref 22–32)
CO2: 26 mmol/L (ref 22–32)
Calcium: 8.7 mg/dL — ABNORMAL LOW (ref 8.9–10.3)
Calcium: 8.9 mg/dL (ref 8.9–10.3)
Chloride: 89 mmol/L — ABNORMAL LOW (ref 98–111)
Chloride: 89 mmol/L — ABNORMAL LOW (ref 98–111)
Creatinine, Ser: 4.46 mg/dL — ABNORMAL HIGH (ref 0.61–1.24)
Creatinine, Ser: 4.01 mg/dL — ABNORMAL HIGH (ref 0.61–1.24)
GFR, Estimated: 14 mL/min — ABNORMAL LOW (ref >60)
GFR, Estimated: 15 mL/min — ABNORMAL LOW (ref >60)
Glucose, Bld: 98 mg/dL (ref 70–99)
Glucose, Bld: 113 mg/dL — ABNORMAL HIGH (ref 70–99)
Potassium: 5.9 mmol/L — ABNORMAL HIGH (ref 3.5–5.1)
Potassium: 5.2 mmol/L — ABNORMAL HIGH (ref 3.5–5.1)
Sodium: 126 mmol/L — ABNORMAL LOW (ref 135–145)
Sodium: 125 mmol/L — ABNORMAL LOW (ref 135–145)

## 2020-10-09 LAB — BPAM RBC
Blood Product Expiration Date: 202203192359
Blood Product Expiration Date: 202203192359
ISSUE DATE / TIME: 202202141425
ISSUE DATE / TIME: 202202141605
Unit Type and Rh: 5100
Unit Type and Rh: 5100

## 2020-10-09 LAB — TYPE AND SCREEN
ABO/RH(D): O POS
Antibody Screen: NEGATIVE
Unit division: 0
Unit division: 0

## 2020-10-09 LAB — MAGNESIUM: Magnesium: 2.5 mg/dL — ABNORMAL HIGH (ref 1.7–2.4)

## 2020-10-09 LAB — PHOSPHORUS: Phosphorus: 6.4 mg/dL — ABNORMAL HIGH (ref 2.5–4.6)

## 2020-10-09 MED ORDER — DEXTROSE 50 % IV SOLN
1.0000 | Freq: Once | INTRAVENOUS | Status: AC
  Administered 2020-10-09: 50 mL via INTRAVENOUS
  Filled 2020-10-09: qty 50

## 2020-10-09 MED ORDER — INSULIN ASPART 100 UNIT/ML ~~LOC~~ SOLN
10.0000 [IU] | Freq: Once | SUBCUTANEOUS | Status: AC
  Administered 2020-10-09: 10 [IU] via INTRAVENOUS
  Filled 2020-10-09: qty 0.1

## 2020-10-09 MED ORDER — PHENYLEPHRINE HCL-NACL 10-0.9 MG/250ML-% IV SOLN
25.0000 ug/min | INTRAVENOUS | Status: DC
  Filled 2020-10-09: qty 250

## 2020-10-09 MED ORDER — MIDODRINE HCL 5 MG PO TABS
10.0000 mg | ORAL_TABLET | Freq: Three times a day (TID) | ORAL | Status: DC
  Administered 2020-10-09 – 2020-10-16 (×22): 10 mg via ORAL
  Filled 2020-10-09 (×23): qty 2

## 2020-10-09 MED ORDER — CALCIUM GLUCONATE-NACL 1-0.675 GM/50ML-% IV SOLN
1.0000 g | Freq: Once | INTRAVENOUS | Status: AC
  Administered 2020-10-09: 1000 mg via INTRAVENOUS
  Filled 2020-10-09: qty 50

## 2020-10-09 MED ORDER — INSULIN REGULAR HUMAN 100 UNIT/ML IJ SOLN
10.0000 [IU] | Freq: Once | INTRAMUSCULAR | Status: DC
  Filled 2020-10-09: qty 3

## 2020-10-09 MED ORDER — SODIUM CHLORIDE FLUSH 0.9 % IV SOLN
INTRAVENOUS | Status: AC
  Administered 2020-10-09: 10 mL
  Filled 2020-10-09: qty 10

## 2020-10-09 NOTE — Plan of Care (Signed)
Pt remains on levo for blood pressure support. Peripheral IV for levo admin continues to have great blood return. Brother visited for much of day. Pt eating well, excellent urine output of almost 3L for shift. Remains on room air. Chaplain at bedside now, talking to brother, [name].   Most of day was in PACU - ICU overflow area due to room maintenance needs in ICU. Back in ICU 18 this evening just before shift change.   Problem: Clinical Measurements: Goal: Ability to maintain clinical measurements within normal limits will improve 10/09/2020 1843 by Juel Burrow, RN Outcome: Progressing 10/09/2020 1843 by Juel Burrow, RN Outcome: Not Progressing Goal: Will remain free from infection 10/09/2020 1843 by Juel Burrow, RN Outcome: Progressing 10/09/2020 1843 by Juel Burrow, RN Outcome: Progressing Goal: Diagnostic test results will improve 10/09/2020 1843 by Juel Burrow, RN Outcome: Progressing 10/09/2020 1843 by Juel Burrow, RN Outcome: Progressing Goal: Respiratory complications will improve 10/09/2020 1843 by Juel Burrow, RN Outcome: Progressing 10/09/2020 1843 by Juel Burrow, RN Outcome: Progressing Goal: Cardiovascular complication will be avoided 10/09/2020 1843 by Juel Burrow, RN Outcome: Progressing 10/09/2020 1843 by Juel Burrow, RN Outcome: Progressing   Problem: Nutrition: Goal: Adequate nutrition will be maintained 10/09/2020 1843 by Juel Burrow, RN Outcome: Progressing 10/09/2020 1843 by Juel Burrow, RN Outcome: Progressing   Problem: Coping: Goal: Level of anxiety will decrease 10/09/2020 1843 by Juel Burrow, RN Outcome: Progressing 10/09/2020 1843 by Juel Burrow, RN Outcome: Not Progressing   Problem: Elimination: Goal: Will not experience complications related to bowel motility 10/09/2020 1843 by Juel Burrow, RN Outcome:  Progressing 10/09/2020 1843 by Juel Burrow, RN Outcome: Not Progressing Goal: Will not experience complications related to urinary retention 10/09/2020 1843 by Juel Burrow, RN Outcome: Progressing 10/09/2020 1843 by Juel Burrow, RN Outcome: Not Progressing   Problem: Pain Managment: Goal: General experience of comfort will improve 10/09/2020 1843 by Juel Burrow, RN Outcome: Progressing 10/09/2020 1843 by Juel Burrow, RN Outcome: Not Progressing   Problem: Safety: Goal: Ability to remain free from injury will improve 10/09/2020 1843 by Juel Burrow, RN Outcome: Progressing 10/09/2020 1843 by Juel Burrow, RN Outcome: Not Progressing   Problem: Skin Integrity: Goal: Risk for impaired skin integrity will decrease 10/09/2020 1843 by Juel Burrow, RN Outcome: Progressing 10/09/2020 1843 by Juel Burrow, RN Outcome: Not Progressing   Problem: Education: Goal: Knowledge of General Education information will improve Description: Including pain rating scale, medication(s)/side effects and non-pharmacologic comfort measures 10/09/2020 1843 by Juel Burrow, RN Outcome: Not Progressing 10/09/2020 1843 by Juel Burrow, RN Outcome: Not Progressing   Problem: Health Behavior/Discharge Planning: Goal: Ability to manage health-related needs will improve 10/09/2020 1843 by Juel Burrow, RN Outcome: Not Progressing 10/09/2020 1843 by Juel Burrow, RN Outcome: Not Progressing   Problem: Activity: Goal: Risk for activity intolerance will decrease 10/09/2020 1843 by Juel Burrow, RN Outcome: Not Progressing 10/09/2020 1843 by Juel Burrow, RN Outcome: Not Progressing

## 2020-10-09 NOTE — ACP (Advance Care Planning) (Signed)
Family (daughter, sister, brother) desire to make decisions together to ensure Nial's comfort as well as their own. Would like to meet with Palliative Care at 3pm on 10/10/2020 so that all are together.

## 2020-10-09 NOTE — Progress Notes (Signed)
[facility]  ROUNDING NOTE   Subjective:   UOP 1022mL Norepinephrine gtt  T - Min: 95.18  Creatinine 4.46 (4.7)  K 5.9 (6.2)  Objective:  Vital signs in last 24 hours:  Temp:  [95.18 F (35.1 C)-98.1 F (36.7 C)] 97.7 F (36.5 C) (02/15 0737) Pulse Rate:  [42-152] 58 (02/15 1200) Resp:  [12-18] 15 (02/15 1200) BP: (43-111)/(29-81) 100/62 (02/15 1230) SpO2:  [79 %-100 %] 95 % (02/15 1200) Weight:  [64.1 kg] 64.1 kg (02/14 1653)  Weight change:  Filed Weights   10/08/20 1233 10/08/20 1653  Weight: 70 kg 64.1 kg    Intake/Output: I/O last 3 completed shifts: In: 4002.5 [I.V.:260; Blood:1642.5; IV ZOXWRUEAV:4098] Out: 1191 [YNWGN:5621]   Intake/Output this shift:  Total I/O In: 2566.6 [P.O.:500; I.V.:2016.6; IV Piggyback:50] Out: 1045 [Urine:1045]  Physical Exam: General: Critically ill  Head: dry oral mucosal membranes  Eyes: +hard of hearing  Neck: Supple, trachea midline  Lungs:  Clear to auscultation  Heart: Regular rate and rhythm  Abdomen:  Soft, nontender,   Extremities: no peripheral edema.  Neurologic: confused  Skin: No lesions  Access: none    Basic Metabolic Panel: Recent Labs  Lab 10/08/20 1244 10/08/20 1435 10/08/20 1910 10/09/20 0349  NA 122* 124* 124* 126*  K 6.0* 5.8* 6.2* 5.9*  CL 89* 91* 91* 89*  CO2 21* 22 22 25   GLUCOSE 90 88 119* 98  BUN 106* 102* 98* 102*  CREATININE 4.90* 4.73* 4.70* 4.46*  CALCIUM 8.1* 8.1* 8.7* 8.7*  MG  --   --   --  2.5*  PHOS  --   --   --  6.4*    Liver Function Tests: Recent Labs  Lab 10/08/20 1244  AST 27  ALT 19  ALKPHOS 129*  BILITOT 0.8  PROT 5.9*  ALBUMIN 2.7*   No results for input(s): LIPASE, AMYLASE in the last 168 hours. No results for input(s): AMMONIA in the last 168 hours.  CBC: Recent Labs  Lab 10/08/20 1244 10/08/20 1910 10/09/20 0349  WBC 6.0 7.0 7.6  NEUTROABS 4.4  --   --   HGB 6.6* 10.6* 10.1*  HCT 21.3* 33.0* 30.6*  MCV 102.9* 96.8 94.4  PLT  356 386 381    Cardiac Enzymes: No results for input(s): CKTOTAL, CKMB, CKMBINDEX, TROPONINI in the last 168 hours.  BNP: Invalid input(s): POCBNP  CBG: Recent Labs  Lab 10/08/20 1619 10/08/20 1651  GLUCAP 87 91    Microbiology: Results for orders placed or performed during the hospital encounter of 10/08/20  Blood Culture (routine x 2)     Status: None (Preliminary result)   Collection Time: 10/08/20 12:35 PM   Specimen: BLOOD  Result Value Ref Range Status   Specimen Description BLOOD LEFT ANTECUBITAL  Final   Special Requests   Final    BOTTLES DRAWN AEROBIC AND ANAEROBIC Blood Culture adequate volume   Culture   Final    NO GROWTH < 24 HOURS Performed at [facility], [address] [place], [place] [address]    Report Status PENDING  Incomplete  Blood Culture (routine x 2)     Status: None (Preliminary result)   Collection Time: 10/08/20 12:35 PM   Specimen: BLOOD  Result Value Ref Range Status   Specimen Description BLOOD RIGHT ANTECUBITAL  Final   Special Requests   Final    BOTTLES DRAWN AEROBIC AND ANAEROBIC Blood Culture adequate volume   Culture   Final  NO GROWTH < 24 HOURS Performed at [facility], [address] [place], [place] [address]    Report Status PENDING  Incomplete  Resp Panel by RT-PCR (Flu A&B, Covid) Nasopharyngeal Swab     Status: None   Collection Time: 10/08/20 12:44 PM   Specimen: Nasopharyngeal Swab; Nasopharyngeal(NP) swabs in vial transport medium  Result Value Ref Range Status   SARS Coronavirus 2 by RT PCR NEGATIVE NEGATIVE Final    Comment: (NOTE) SARS-CoV-2 target nucleic acids are NOT DETECTED.  The SARS-CoV-2 RNA is generally detectable in upper respiratory specimens during the acute phase of infection. The lowest concentration of SARS-CoV-2 viral copies this assay can detect is 138 copies/mL. A negative result does not preclude SARS-Cov-2 infection and should not be used as the sole basis for  treatment or other patient management decisions. A negative result may occur with  improper specimen collection/handling, submission of specimen other than nasopharyngeal swab, presence of viral mutation(s) within the areas targeted by this assay, and inadequate number of viral copies(<138 copies/mL). A negative result must be combined with clinical observations, patient history, and epidemiological information. The expected result is Negative.  Fact Sheet for Patients:  EntrepreneurPulse.com.au  Fact Sheet for Healthcare Providers:  IncredibleEmployment.be  This test is no t yet approved or cleared by the [place] FDA and  has been authorized for detection and/or diagnosis of SARS-CoV-2 by FDA under an Emergency Use Authorization (EUA). This EUA will remain  in effect (meaning this test can be used) for the duration of the COVID-19 declaration under Section 564(b)(1) of the Act, 21 U.S.C.section 360bbb-3(b)(1), unless the authorization is terminated  or revoked sooner.       Influenza A by PCR NEGATIVE NEGATIVE Final   Influenza B by PCR NEGATIVE NEGATIVE Final    Comment: (NOTE) The Xpert Xpress SARS-CoV-2/FLU/RSV plus assay is intended as an aid in the diagnosis of influenza from Nasopharyngeal swab specimens and should not be used as a sole basis for treatment. Nasal washings and aspirates are unacceptable for Xpert Xpress SARS-CoV-2/FLU/RSV testing.  Fact Sheet for Patients: EntrepreneurPulse.com.au  Fact Sheet for Healthcare Providers: IncredibleEmployment.be  This test is not yet approved or cleared by the [place] FDA and has been authorized for detection and/or diagnosis of SARS-CoV-2 by FDA under an Emergency Use Authorization (EUA). This EUA will remain in effect (meaning this test can be used) for the duration of the COVID-19 declaration under Section 564(b)(1) of the Act, 21  U.S.C. section 360bbb-3(b)(1), unless the authorization is terminated or revoked.  Performed at [facility], [address] [place], [place] [address]   MRSA PCR Screening     Status: None   Collection Time: 10/08/20  4:53 PM   Specimen: Nasopharyngeal  Result Value Ref Range Status   MRSA by PCR NEGATIVE NEGATIVE Final    Comment:        The GeneXpert MRSA Assay (FDA approved for NASAL specimens only), is one component of a comprehensive MRSA colonization surveillance program. It is not intended to diagnose MRSA infection nor to guide or monitor treatment for MRSA infections. Performed at [facility], [address] [place], [place] [address]    *Note: Due to a large number of results and/or encounters for the requested time period, some results have not been displayed. A complete set of results can be found in Results Review.    Coagulation Studies: Recent Labs    10/08/20 1244  LABPROT 18.5*  INR 1.6*  Urinalysis: No results for input(s): COLORURINE, LABSPEC, PHURINE, GLUCOSEU, HGBUR, BILIRUBINUR, KETONESUR, PROTEINUR, UROBILINOGEN, NITRITE, LEUKOCYTESUR in the last 72 hours.  Invalid input(s): APPERANCEUR    Imaging: US RENAL  Result Date: 10/09/2020 CLINICAL DATA:  Acute renal failure. EXAM: RENAL / URINARY TRACT ULTRASOUND COMPLETE COMPARISON:  None. FINDINGS: Right Kidney: Renal measurements: 9.3 x 4.7 x 3.7 cm = volume: 85.1 mL. Echogenicity within normal limits. No mass or hydronephrosis visualized. Left Kidney: Renal measurements: 9.5 x 5.7 x 4.7 cm = volume: 134.2 mL. Echogenicity within normal limits. No mass or hydronephrosis visualized. Bladder: Bladder is decompressed with a Foley catheter. Other: Incidental note is made of a heterogeneous lobular hepatic parenchymal pattern. Cirrhosis cannot be excluded. Mild ascites noted. Pleural effusions noted. IMPRESSION: 1. No acute renal abnormality identified. No hydronephrosis or focal  renal abnormality. 2. Bladder is decompressed with a Foley catheter. 3. Incidental note is made of a heterogeneous lobular hepatic parenchymal pattern. Cirrhosis cannot be excluded. Mild ascites noted. Pleural effusions noted. Electronically Signed   By: Marcello Moores  Register   On: 10/09/2020 07:45   DG Chest Port 1 View  Result Date: 10/08/2020 CLINICAL DATA:  Sepsis EXAM: PORTABLE CHEST 1 VIEW COMPARISON:  08/12/2020 FINDINGS: Cardiac shadow is enlarged but stable. Defibrillator and postsurgical changes are again noted. Old rib fractures are noted on the right with healing and callus formation. Previously seen vascular congestion has improved. No focal infiltrate is noted. IMPRESSION: Improved vascular congestion and edema when compare with the prior exam. No focal infiltrate is seen. Electronically Signed   By: Inez Catalina M.D.   On: 10/08/2020 13:13     Medications:   . sodium chloride 250 mL (10/08/20 1744)  . sodium chloride 100 mL/hr at 10/09/20 1100  . norepinephrine (LEVOPHED) Adult infusion 10 mcg/min (10/09/20 1125)  . phenylephrine (NEO-SYNEPHRINE) Adult infusion     . sodium chloride flush      . amiodarone  200 mg Oral Daily  . atorvastatin  40 mg Oral Daily  . Chlorhexidine Gluconate Cloth  6 each Topical Daily  . mouth rinse  15 mL Mouth Rinse BID  . midodrine  10 mg Oral TID WC  . pantoprazole  40 mg Oral Daily   docusate sodium, HYDROcodone-acetaminophen, morphine injection, ondansetron (ZOFRAN) IV, polyethylene glycol, traZODone  Assessment/ Plan:  Mr. Carl [name] is a 70 y.o. white male with coronary artery disease status post mitral valve repair, hepatic cirrhosis, congestive heart failure, AICD placement, hypertension, hyperlipidemia, peripheral vascular disease, peptic ulcer disease, hearing loss, who was admitted to [facility] on 10/08/2020 for Hypovolemic shock ([place]) [R57.1] Hyperkalemia [E87.5] Bradycardia [R00.1] Shock circulatory ([place]) [R57.9] Acute GI bleeding  [K92.2] Dehydration, severe [E86.0] AKI (acute kidney injury) ([place]) [N17.9]  1. Acute kidney injury: with hyperkalemia: on chronic kidney disease stage IIIB. Baseline creatinine of 2.2 with GFR of 32 on 07/03/20. History of bland urine.  Renal ultrasound reviewed. - hold potassium chloride.  - Continue foley catheter - no acute indication for dialysis. Patient is documented as stating he was not interested in dialysis. Will not offer dialysis due to patient's overall poor prognosis.  2. Hyponatremia: acute on chronic: on home regimen includes metolazone - IV normal saline  3. Anemia with acute kidney failure: macrocytic. Multiple PRBC transfusions.     LOS: 1 Carl [name] 2/15/20221:05 PM

## 2020-10-09 NOTE — Clinical Social Work Note (Signed)
CSW acknowledges consult regarding advanced directives and HCPOA contact information. Per note from Melene Plan, RN with [facility] on 09/25/2020, "The patient/caregiver reports he is divorced with 2 children. Patient identified his daughter and his brother as his support system for his healthcare goals and navigating the healthcare system. The patient's daughter reports the patient's brother "[name]" is the patient's HCPOA, however per previous conversation with the patient's brother, they have not completed paperwork for this."   Dayton Scrape, [place]

## 2020-10-09 NOTE — Progress Notes (Signed)
No ICM remote transmission received for 10/09/2020 due to patient is currently hospitalized and next ICM transmission scheduled for 10/22/2020.

## 2020-10-09 NOTE — Progress Notes (Signed)
Barnett Applebaum, is daughter, works during day. Arshan Jabs is brother.  [name] is sister. No power of attorney forms are completed.  The three are involved in his care. [name] will be in hospital on 10/10/2020 after 10am. Barnett Applebaum wants [name] and [name] to make decisions and she has stated the dialysis is probably not in his best interest. [name] can be reached at [phone] at any time and would like to be part of the Palliative Care meeting. Family is looking forward to conversation with Palliative Care. They also have a community person who is very active in helping offer health care support to aid feelings of being overwhelmed.     10/09/20 1800  Clinical Encounter Type  Visited With Patient and family together  Visit Type Critical Care

## 2020-10-09 NOTE — Progress Notes (Signed)
NAME:  Carl [name], MRN:  [id number], DOB:  13-Aug-1951, LOS: 1 ADMISSION DATE:  10/08/2020, CONSULTATION DATE:  2/14 REFERRING MD:  Jacqualine Code, CHIEF COMPLAINT:  weakness   Brief History:  70 y/o male with extensive past medical history presented to the [facility] ED on 10/09/19 with a chief complaint of generalized weakness and change in mental status.  He was noted to be in shock, bradycardic, hypothermic, profoundly anemic and an acute kidney injury.  Critical care was consulted for further evaluation as he was requiring vasopressors.  The underlying cause of his shock was felt to be hypovolemia from diuretic use, poor p.o. intake, and possibly a slow chronic GI bleed. Past Medical History:  Chronic systolic heart failure, LVEF 20 to 25%, followed by Dr. Algernon Huxley.  Has ICD in place Anxiety Osteoarthritis Chronic kidney disease Cirrhosis of the liver Atrial fibrillation External hemorrhoids GERD History of GI bleeding felt to be possibly related to AVM Status post mitral valve repair Hyperlipidemia Intellectual disability History of coronary artery disease status post MI Peripheral vascular disease Tubular adenoma of the colon Schatzki's ring Peptic ulcer disease Psoriasis Vitiligo  Significant Hospital Events:  2/14 admission  Consults:  PCCM Nephrology Cardiology GI  Procedures:  none  Significant Diagnostic Tests:  10/09/2020 US renal >> no acute abnormality, no hydronephrosis  Micro Data:  February 14 SARS-CoV-2 > negative, influenza negative February 14 blood culture >  10/08/2020 MRSA >> negative Antimicrobials:    Interim History / Subjective:  Patient sitting in bed alert but not responsive verbally to questions, picking at blanket absentmindedly.   Labs/ Imaging personally reviewed Net: + 1.5 L (+4 L since admit) Na+/ K+: 126/5.9 (improving) BUN/Cr.:  102/4.46 Hgb: 10.1 WBC/ TMAX: 7.6/afebrile Objective   Blood pressure (!) 90/57, pulse (!) 57, temperature  97.7 F (36.5 C), temperature source Oral, resp. rate 16, height 5\' 10"  (1.778 m), weight 64.1 kg, SpO2 98 %.        Intake/Output Summary (Last 24 hours) at 10/09/2020 1127 Last data filed at 10/09/2020 1100 Gross per 24 hour  Intake 6569.07 ml  Output 1985 ml  Net 4584.07 ml   Filed Weights   10/08/20 1233 10/08/20 1653  Weight: 70 kg 64.1 kg    Examination: General: Adult male, critically ill, lying in bed, frail appearing, NAD HEENT: MM pink/ dry, anicteric, atraumatic, neck supple Neuro: *Alert but not responsive, absently picking at blanket, unable to follow commands, PERRL +3, MAE CV: s1s2 RRR, SB  on monitor, no r/m/g Pulm: Regular, non labored on room air, breath sounds diminished throughout GI: soft, rounded, non tender (?), bs x 4 GU: foley in place with clear yellow urine Skin: limited exam-no rashes/lesions noted Extremities: warm/dry, pulses + 2 R/P, no edema noted  Resolved Hospital Problem list     Assessment & Plan:  Hypovolemic and hemorrhagic shock: Not actively bleeding, complicated by cardiomyopathy and chronic systolic heart failure Hgb stable to day at 10.1 - continue levophed drip PRN to maintain MAP > 65, utilize neo-synephrine only if needed - midodrine increased to 10 mg TID - continue IVF: LR @ 100 mL/h per nephrology's recommendation - consider additional pRBC's if Hgb drops  Junctional bradycardia due to hyperkalemia and hypothermia: Chronic systolic heart failure PMHx: A-fib Received Lokelma x 2. Unable to restart anticoagulation due to chronic GIB  - continuous cardiac monitoring - correct hyperkalemia - cardiology following, do not recommend restarting anticoagulation, palliative consult recommended - hold home coreg, amiodarone  Hemorrhagic anemia: Chronic  GI bleed, no evidence of acute GI bleeding: - Monitor for s/s of bleeding - Daily CBC - Transfuse for Hgb <7 - SCD's for DVT prophylaxis - home anticoagulation on hold -  continue home protonix  Acute kidney injury due to hypovolemia: not a candidate for dialysis Lokelma dosed x 2. Patient documented stating he would not want HD. - Strict I/O's: alert provider if UOP < 0.5 mL/kg/hr - Daily BMP, replace electrolytes PRN - Avoid nephrotoxic agents as able, ensure adequate renal perfusion - Nephrology following, appreciate input - Closely monitor K+, consider veltassa/ additional lokemla doses if recheck K+ remains > 5  Cirrhosis without evidence of ascites - home diuretics on hold (metolazone & torsemide) - trend hepatic function  Acute metabolic encephalopathy due to hypovolemia, shock, anemia - hold home gabapentin - supportive care, including reorientation - minimize sedative medications  Gout: No evidence of flareup - hold allopurinol  Also present on admission Frailty Failure to thrive Physical deconditoining  Best practice (evaluated daily)  Diet: NPO Pain/Anxiety/Delirium protocol (if indicated): n/a VAP protocol (if indicated): n/a DVT prophylaxis: SCD GI prophylaxis: Oral ppi Glucose control: monitor Mobility: bed rest, mobilize as tolerated Disposition: ICU  Goals of Care:  Last date of multidisciplinary goals of care discussion: 2/14 Family and staff present: [place] Bells, daughter Barnett Applebaum Summary of discussion: she feels that he is tired and would not want prolonged aggressive care.  He has never given her clear direction about hemodialysis but when I briefly described it she said that she didn't think he would want it.   Follow up goals of care discussion due: 2/21 Code Status: DNR  Labs   CBC: Recent Labs  Lab 10/08/20 1244 10/08/20 1910 10/09/20 0349  WBC 6.0 7.0 7.6  NEUTROABS 4.4  --   --   HGB 6.6* 10.6* 10.1*  HCT 21.3* 33.0* 30.6*  MCV 102.9* 96.8 94.4  PLT 356 386 659    Basic Metabolic Panel: Recent Labs  Lab 10/08/20 1244 10/08/20 1435 10/08/20 1910 10/09/20 0349  NA 122* 124* 124* 126*  K 6.0* 5.8* 6.2*  5.9*  CL 89* 91* 91* 89*  CO2 21* 22 22 25   GLUCOSE 90 88 119* 98  BUN 106* 102* 98* 102*  CREATININE 4.90* 4.73* 4.70* 4.46*  CALCIUM 8.1* 8.1* 8.7* 8.7*  MG  --   --   --  2.5*  PHOS  --   --   --  6.4*   GFR: Estimated Creatinine Clearance: 14.2 mL/min (A) (by C-G formula based on SCr of 4.46 mg/dL (H)). Recent Labs  Lab 10/08/20 1234 10/08/20 1244 10/08/20 1910 10/09/20 0349  WBC  --  6.0 7.0 7.6  LATICACIDVEN 1.0  --   --   --     Liver Function Tests: Recent Labs  Lab 10/08/20 1244  AST 27  ALT 19  ALKPHOS 129*  BILITOT 0.8  PROT 5.9*  ALBUMIN 2.7*   No results for input(s): LIPASE, AMYLASE in the last 168 hours. No results for input(s): AMMONIA in the last 168 hours.  ABG    Component Value Date/Time   PHART 7.437 12/27/2018 0957   PCO2ART 35.5 12/27/2018 0957   PO2ART 99.6 12/27/2018 0957   HCO3 23.6 12/27/2018 0957   TCO2 34 (H) 11/09/2019 0924   ACIDBASEDEF 0.1 12/27/2018 0957   O2SAT 97.4 12/27/2018 0957     Coagulation Profile: Recent Labs  Lab 10/08/20 1244  INR 1.6*    Cardiac Enzymes: No results for input(s): CKTOTAL, CKMB,  CKMBINDEX, TROPONINI in the last 168 hours.  HbA1C: Hgb A1c MFr Bld  Date/Time Value Ref Range Status  12/27/2018 09:58 AM 5.9 (H) 4.8 - 5.6 % Final    Comment:    (NOTE) Pre diabetes:          5.7%-6.4% Diabetes:              >6.4% Glycemic control for   <7.0% adults with diabetes   06/11/2017 05:40 PM 5.3 4.8 - 5.6 % Final    Comment:    (NOTE) Pre diabetes:          5.7%-6.4% Diabetes:              >6.4% Glycemic control for   <7.0% adults with diabetes     CBG: Recent Labs  Lab 10/08/20 1619 10/08/20 1651  GLUCAP 87 91      Critical care time: 35 minutes      Venetia Night, AGACNP-BC Acute Care Nurse Practitioner [facility]   [phone] / [phone] Please see Amion for pager details.

## 2020-10-09 NOTE — Progress Notes (Signed)
Family would like at 3pm palliative care meeting so that brother, sister and daughter would have opportunity to be together to aid in moving forward in [place] care.

## 2020-10-09 NOTE — Progress Notes (Signed)
Progress Note  Patient Name: Carl [name] Date of Encounter: 10/09/2020  [facility] Cardiologist: [name], MD  Subjective   Patient complains only of back pain.  No chest pain, shortness of breath, or palpitations.  He denies bleeding.  Inpatient Medications    Scheduled Meds: . amiodarone  200 mg Oral Daily  . atorvastatin  40 mg Oral Daily  . Chlorhexidine Gluconate Cloth  6 each Topical Daily  . mouth rinse  15 mL Mouth Rinse BID  . midodrine  10 mg Oral TID WC  . pantoprazole  40 mg Oral Daily   Continuous Infusions: . sodium chloride Stopped (10/09/20 0745)  . sodium chloride 100 mL/hr at 10/09/20 1807  . norepinephrine (LEVOPHED) Adult infusion 9 mcg/[name] (10/09/20 1900)  . phenylephrine (NEO-SYNEPHRINE) Adult infusion     PRN Meds: docusate sodium, HYDROcodone-acetaminophen, morphine injection, ondansetron (ZOFRAN) IV, polyethylene glycol, traZODone   Vital Signs    Vitals:   10/09/20 1600 10/09/20 1700 10/09/20 1800 10/09/20 1900  BP: (!) 96/53 (!) 95/56 (!) 105/59 (!) 111/55  Pulse: 63 (!) 56 60 73  Resp: 18 16 15 13   Temp: 97.6 F (36.4 C)     TempSrc: Axillary     SpO2: 100% 99% 98% 98%  Weight:      Height:        Intake/Output Summary (Last 24 hours) at 10/09/2020 1958 Last data filed at 10/09/2020 1900 Gross per 24 hour  Intake 3798.65 ml  Output 4345 ml  Net -546.35 ml   Last 3 Weights 10/08/2020 10/08/2020 09/19/2020  Weight (lbs) 141 lb 5 oz 154 lb 5.2 oz 152 lb 3.2 oz  Weight (kg) 64.1 kg 70 kg 69.037 kg      Telemetry    Sinus rhythm.  Telemetry not available for most of the day as the patient had to be moved to the PACU due to facility issues in the ICU - Personally Reviewed  ECG    No new tracing  Physical Exam   GEN: No acute distress.  Hard of hearing. Neck: No JVD Cardiac: RRR, no murmurs, rubs, or gallops.  Respiratory:  Mildly diminished breath sounds throughout without wheezes or crackles. GI:  Firm and mildly  distended without tenderness. MS: No edema; No deformity. Neuro:  Nonfocal  Psych: Normal affect   Labs    High Sensitivity Troponin:   Recent Labs  Lab 10/08/20 1244  TROPONINIHS 35*      Chemistry Recent Labs  Lab 10/08/20 1244 10/08/20 1435 10/08/20 1910 10/09/20 0349 10/09/20 1253  NA 122*   < > 124* 126* 125*  K 6.0*   < > 6.2* 5.9* 5.2*  CL 89*   < > 91* 89* 89*  CO2 21*   < > 22 25 26   GLUCOSE 90   < > 119* 98 113*  BUN 106*   < > 98* 102* 95*  CREATININE 4.90*   < > 4.70* 4.46* 4.01*  CALCIUM 8.1*   < > 8.7* 8.7* 8.9  PROT 5.9*  --   --   --   --   ALBUMIN 2.7*  --   --   --   --   AST 27  --   --   --   --   ALT 19  --   --   --   --   ALKPHOS 129*  --   --   --   --   BILITOT 0.8  --   --   --   --  GFRNONAA 12*   < > 13* 14* 15*  ANIONGAP 12   < > 11 12 10    < > = values in this interval not displayed.     Hematology Recent Labs  Lab 10/08/20 1244 10/08/20 1910 10/09/20 0349  WBC 6.0 7.0 7.6  RBC 2.07* 3.41* 3.24*  HGB 6.6* 10.6* 10.1*  HCT 21.3* 33.0* 30.6*  MCV 102.9* 96.8 94.4  MCH 31.9 31.1 31.2  MCHC 31.0 32.1 33.0  RDW 18.0* 19.4* 19.3*  PLT 356 386 381    BNP Recent Labs  Lab 10/08/20 1245  BNP 564.4*     DDimer No results for input(s): DDIMER in the last 168 hours.   Radiology    US RENAL  Result Date: 10/09/2020 CLINICAL DATA:  Acute renal failure. EXAM: RENAL / URINARY TRACT ULTRASOUND COMPLETE COMPARISON:  None. FINDINGS: Right Kidney: Renal measurements: 9.3 x 4.7 x 3.7 cm = volume: 85.1 mL. Echogenicity within normal limits. No mass or hydronephrosis visualized. Left Kidney: Renal measurements: 9.5 x 5.7 x 4.7 cm = volume: 134.2 mL. Echogenicity within normal limits. No mass or hydronephrosis visualized. Bladder: Bladder is decompressed with a Foley catheter. Other: Incidental note is made of a heterogeneous lobular hepatic parenchymal pattern. Cirrhosis cannot be excluded. Mild ascites noted. Pleural effusions noted.  IMPRESSION: 1. No acute renal abnormality identified. No hydronephrosis or focal renal abnormality. 2. Bladder is decompressed with a Foley catheter. 3. Incidental note is made of a heterogeneous lobular hepatic parenchymal pattern. Cirrhosis cannot be excluded. Mild ascites noted. Pleural effusions noted. Electronically Signed   By: Marcello Moores  Register   On: 10/09/2020 07:45   DG Chest Port 1 View  Result Date: 10/08/2020 CLINICAL DATA:  Sepsis EXAM: PORTABLE CHEST 1 VIEW COMPARISON:  08/12/2020 FINDINGS: Cardiac shadow is enlarged but stable. Defibrillator and postsurgical changes are again noted. Old rib fractures are noted on the right with healing and callus formation. Previously seen vascular congestion has improved. No focal infiltrate is noted. IMPRESSION: Improved vascular congestion and edema when compare with the prior exam. No focal infiltrate is seen. Electronically Signed   By: Inez Catalina M.D.   On: 10/08/2020 13:13    Cardiac Studies   TEE (11/09/2019): 1. Left ventricular ejection fraction, by estimation, is 20 to 25%. The  left ventricle has severely decreased function. The left ventricle  demonstrates global hypokinesis. The left ventricular internal cavity size  was moderately dilated.  2. Right ventricular systolic function is moderately reduced. The right  ventricular size is mildly enlarged. There is mildly elevated pulmonary  artery systolic pressure.  3. There was an Amplatzer atrial septal closure device present, no  shunting noted by color doppler. Left atrial size was moderately dilated.  No left atrial/left atrial appendage thrombus was detected.  4. Right atrial size was moderately dilated.  5. Peak RV-RA gradient 24 mmHg. Tricuspid valve regurgitation is moderate  to severe.  6. The aortic valve is tricuspid. Aortic valve regurgitation is trivial.  No aortic stenosis is present.  7. The mitral valve was s/p repair with 2 Mitraclip devices placed in the   A2-P2 area. There was moderate residual MR with PISA ERO 0.25 cm^2. Mean  gradient 3 mmHg across the mitral valve, no significant stenosis. No  pulmonary vein systolic flow reversal  noted.  8. Normal caliber thoracic aorta with mild plaque.   R/LHC 11/2018: 1. Severe native vessel CAD with total occlusion of the RCA, LAD, and left circumflex and severe distal left main  stenosis 2. Status post multivessel CABG with continued patency of the LIMA to LAD, saphenous vein graft to first diagonal, sequential saphenous vein graft to OM 2 and OM 3, and saphenous vein graft to PDA 3. Preserved cardiac output 4. Large V waves consistent with severe mitral regurgitation  Patient Profile     70 y.o. male with history of extensive CAD status post CABG, chronic HFrEF due to ischemic cardiomyopathy, severe MR status post mitral clip, AF, CKD, chronic AVMs, and cirrhosis.  Assessment & Plan    Chronic HFrEF: Mr. Hardacre appears grossly euvolemic though he and his family had been concerned about volume overload leading up to recent admission leading to escalation of diuretic therapy.  I would aim for net even to slightly positive fluid balance at this time.  Shock: Likely multifactorial but driven primarily by hypovolemia.  Patient remains on norepinephrine.  Continue norepinephrine per CCM and Midrin to facilitate weaning from norepinephrine.  Maintain net even to slightly positive fluid balance.  Junctional bradycardia: Telemetry available for review today shows sinus rhythm, likely improved with PRBC transfusion and improvement in hypothermia and electrolyte derangements.  Avoid beta-blockers and calcium channel blockers.  Oxygen on hold.  Consider discontinuation of amiodarone if bradycardia remains an issue.  Severe anemia: Most likely due to chronic GI bleeding without evidence of active bleeding.  GI has deferred endoscopy.  Supportive care.  Consider transfusion for hemoglobin  less than 8.  Hold apixaban.  Disposition: Patient has multiple acute on chronic medical conditions with guarded prognosis and would benefit from continued palliative care involvement.   For questions or updates, please contact [place] Please consult [email or web address] for contact info under [facility].      Signed, Nelva Bush, MD  10/09/2020, 7:58 PM

## 2020-10-10 ENCOUNTER — Inpatient Hospital Stay: Payer: Medicare Other

## 2020-10-10 DIAGNOSIS — I5020 Unspecified systolic (congestive) heart failure: Secondary | ICD-10-CM | POA: Diagnosis not present

## 2020-10-10 DIAGNOSIS — N179 Acute kidney failure, unspecified: Secondary | ICD-10-CM

## 2020-10-10 DIAGNOSIS — K922 Gastrointestinal hemorrhage, unspecified: Secondary | ICD-10-CM | POA: Diagnosis not present

## 2020-10-10 DIAGNOSIS — I480 Paroxysmal atrial fibrillation: Secondary | ICD-10-CM | POA: Diagnosis not present

## 2020-10-10 DIAGNOSIS — Z7189 Other specified counseling: Secondary | ICD-10-CM

## 2020-10-10 DIAGNOSIS — Z515 Encounter for palliative care: Secondary | ICD-10-CM

## 2020-10-10 DIAGNOSIS — Z66 Do not resuscitate: Secondary | ICD-10-CM

## 2020-10-10 LAB — BASIC METABOLIC PANEL
Anion gap: 8 (ref 5–15)
BUN: 74 mg/dL — ABNORMAL HIGH (ref 8–23)
CO2: 27 mmol/L (ref 22–32)
Calcium: 8.7 mg/dL — ABNORMAL LOW (ref 8.9–10.3)
Chloride: 95 mmol/L — ABNORMAL LOW (ref 98–111)
Creatinine, Ser: 2.82 mg/dL — ABNORMAL HIGH (ref 0.61–1.24)
GFR, Estimated: 23 mL/min — ABNORMAL LOW (ref >60)
Glucose, Bld: 114 mg/dL — ABNORMAL HIGH (ref 70–99)
Potassium: 4.3 mmol/L (ref 3.5–5.1)
Sodium: 130 mmol/L — ABNORMAL LOW (ref 135–145)

## 2020-10-10 LAB — CBC
HCT: 32.8 % — ABNORMAL LOW (ref 39.0–52.0)
Hemoglobin: 10.7 g/dL — ABNORMAL LOW (ref 13.0–17.0)
MCH: 30.9 pg (ref 26.0–34.0)
MCHC: 32.6 g/dL (ref 30.0–36.0)
MCV: 94.8 fL (ref 80.0–100.0)
Platelets: 386 10*3/uL (ref 150–400)
RBC: 3.46 MIL/uL — ABNORMAL LOW (ref 4.22–5.81)
RDW: 19.1 % — ABNORMAL HIGH (ref 11.5–15.5)
WBC: 7.6 10*3/uL (ref 4.0–10.5)
nRBC: 0 % (ref 0.0–0.2)

## 2020-10-10 LAB — GLUCOSE, CAPILLARY: Glucose-Capillary: 111 mg/dL — ABNORMAL HIGH (ref 70–99)

## 2020-10-10 LAB — MAGNESIUM: Magnesium: 2.1 mg/dL (ref 1.7–2.4)

## 2020-10-10 LAB — PHOSPHORUS: Phosphorus: 4.1 mg/dL (ref 2.5–4.6)

## 2020-10-10 NOTE — Progress Notes (Signed)
Progress Note  Patient Name: Carl [name] Date of Encounter: 10/10/2020  [facility] Cardiologist: [name], MD  Subjective   Denies chest pain or shortness of breath.  Currently eating lunch, sister at bedside.  Started on IV fluids by nephrology with significant improvement in creatinine.  Inpatient Medications    Scheduled Meds: . amiodarone  200 mg Oral Daily  . atorvastatin  40 mg Oral Daily  . Chlorhexidine Gluconate Cloth  6 each Topical Daily  . mouth rinse  15 mL Mouth Rinse BID  . midodrine  10 mg Oral TID WC  . pantoprazole  40 mg Oral Daily   Continuous Infusions: . sodium chloride Stopped (10/09/20 0745)  . sodium chloride 100 mL/hr at 10/10/20 1300  . norepinephrine (LEVOPHED) Adult infusion 4 mcg/min (10/10/20 1300)   PRN Meds: docusate sodium, HYDROcodone-acetaminophen, morphine injection, ondansetron (ZOFRAN) IV, polyethylene glycol, traZODone   Vital Signs    Vitals:   10/10/20 1245 10/10/20 1300 10/10/20 1315 10/10/20 1330  BP: (!) 91/53 (!) 91/54 (!) 90/53 (!) 82/43  Pulse: 67 66 76 71  Resp: 13 13 16 14   Temp:      TempSrc:      SpO2: 100% 99% 99% 96%  Weight:      Height:        Intake/Output Summary (Last 24 hours) at 10/10/2020 1507 Last data filed at 10/10/2020 1343 Gross per 24 hour  Intake 2877.75 ml  Output 5305 ml  Net -2427.25 ml   Last 3 Weights 10/08/2020 10/08/2020 09/19/2020  Weight (lbs) 141 lb 5 oz 154 lb 5.2 oz 152 lb 3.2 oz  Weight (kg) 64.1 kg 70 kg 69.037 kg      Telemetry    Sinus rhythm, heart rate 70- Personally Reviewed  ECG    No new tracing obtained- Personally Reviewed  Physical Exam   GEN: No acute distress.   Neck: No JVD Cardiac: RRR, distant heart sounds Respiratory:  Diminished breath sounds at bases GI: Soft, nontender, non-distended  MS: No edema; No deformity. Neuro:  Nonfocal  Psych: Normal affect   Labs    High Sensitivity Troponin:   Recent Labs  Lab 10/08/20 1244   TROPONINIHS 35*      Chemistry Recent Labs  Lab 10/08/20 1244 10/08/20 1435 10/09/20 0349 10/09/20 1253 10/10/20 0652  NA 122*   < > 126* 125* 130*  K 6.0*   < > 5.9* 5.2* 4.3  CL 89*   < > 89* 89* 95*  CO2 21*   < > 25 26 27   GLUCOSE 90   < > 98 113* 114*  BUN 106*   < > 102* 95* 74*  CREATININE 4.90*   < > 4.46* 4.01* 2.82*  CALCIUM 8.1*   < > 8.7* 8.9 8.7*  PROT 5.9*  --   --   --   --   ALBUMIN 2.7*  --   --   --   --   AST 27  --   --   --   --   ALT 19  --   --   --   --   ALKPHOS 129*  --   --   --   --   BILITOT 0.8  --   --   --   --   GFRNONAA 12*   < > 14* 15* 23*  ANIONGAP 12   < > 12 10 8    < > = values in this interval not  displayed.     Hematology Recent Labs  Lab 10/08/20 1910 10/09/20 0349 10/10/20 0652  WBC 7.0 7.6 7.6  RBC 3.41* 3.24* 3.46*  HGB 10.6* 10.1* 10.7*  HCT 33.0* 30.6* 32.8*  MCV 96.8 94.4 94.8  MCH 31.1 31.2 30.9  MCHC 32.1 33.0 32.6  RDW 19.4* 19.3* 19.1*  PLT 386 381 386    BNP Recent Labs  Lab 10/08/20 1245  BNP 564.4*     DDimer No results for input(s): DDIMER in the last 168 hours.   Radiology    US RENAL  Result Date: 10/09/2020 CLINICAL DATA:  Acute renal failure. EXAM: RENAL / URINARY TRACT ULTRASOUND COMPLETE COMPARISON:  None. FINDINGS: Right Kidney: Renal measurements: 9.3 x 4.7 x 3.7 cm = volume: 85.1 mL. Echogenicity within normal limits. No mass or hydronephrosis visualized. Left Kidney: Renal measurements: 9.5 x 5.7 x 4.7 cm = volume: 134.2 mL. Echogenicity within normal limits. No mass or hydronephrosis visualized. Bladder: Bladder is decompressed with a Foley catheter. Other: Incidental note is made of a heterogeneous lobular hepatic parenchymal pattern. Cirrhosis cannot be excluded. Mild ascites noted. Pleural effusions noted. IMPRESSION: 1. No acute renal abnormality identified. No hydronephrosis or focal renal abnormality. 2. Bladder is decompressed with a Foley catheter. 3. Incidental note is made of a  heterogeneous lobular hepatic parenchymal pattern. Cirrhosis cannot be excluded. Mild ascites noted. Pleural effusions noted. Electronically Signed   By: Marcello Moores  Register   On: 10/09/2020 07:45   DG Chest Port 1 View  Result Date: 10/10/2020 CLINICAL DATA:  Hypotension, edema EXAM: PORTABLE CHEST 1 VIEW COMPARISON:  10/08/2020 FINDINGS: Cardiomegaly, vascular congestion. Diffuse interstitial prominence throughout the lungs, favor edema. No real change since prior study. Left AICD remains in place, unchanged. Prior median sternotomy/CABG. No effusions or pneumothorax. Old right rib fractures again noted, unchanged. IMPRESSION: Cardiomegaly, vascular congestion and mild pulmonary edema. No real change. Electronically Signed   By: Rolm Baptise M.D.   On: 10/10/2020 12:19    Cardiac Studies   TEE (11/09/2019): 1. Left ventricular ejection fraction, by estimation, is 20 to 25%. The  left ventricle has severely decreased function. The left ventricle  demonstrates global hypokinesis. The left ventricular internal cavity size  was moderately dilated.  2. Right ventricular systolic function is moderately reduced. The right  ventricular size is mildly enlarged. There is mildly elevated pulmonary  artery systolic pressure.  3. There was an Amplatzer atrial septal closure device present, no  shunting noted by color doppler. Left atrial size was moderately dilated.  No left atrial/left atrial appendage thrombus was detected.  4. Right atrial size was moderately dilated.  5. Peak RV-RA gradient 24 mmHg. Tricuspid valve regurgitation is moderate  to severe.  6. The aortic valve is tricuspid. Aortic valve regurgitation is trivial.  No aortic stenosis is present.  7. The mitral valve was s/p repair with 2 Mitraclip devices placed in the  A2-P2 area. There was moderate residual MR with PISA ERO 0.25 cm^2. Mean  gradient 3 mmHg across the mitral valve, no significant stenosis. No  pulmonary vein  systolic flow reversal  noted.  8. Normal caliber thoracic aorta with mild plaque.   R/LHC 11/2018: 1. Severe native vessel CAD with total occlusion of the RCA, LAD, and left circumflex and severe distal left main stenosis 2. Status post multivessel CABG with continued patency of the LIMA to LAD, saphenous vein graft to first diagonal, sequential saphenous vein graft to OM 2 and OM 3, and saphenous vein graft  to PDA 3. Preserved cardiac output 4. Large V waves consistent with severe mitral regurgitation  Patient Profile     70 y.o. male with history of CAD/CABG x5, severe MR status post mitral clip, CKD, AVM, GI bleed, presenting with fatigue and weakness, found to have anemia with hemoglobin of 6.6.  Patient being seen for chronic cardiac conditions and hypertension.  Assessment & Plan    1.  Ischemic cardiomyopathy, EF 25% -Low blood pressures preventing additional CHF meds. -Midodrine, pressors as needed for BP support.  2.  A. fib status post DC cardioversion -Maintaining sinus rhythm on amiodarone p.o. -Off Eliquis due to anemia  3.  Anemia, GI bleed, AVM -Hemoglobin stable the past 2 days. -Agree with holding anticoagulants/antiplatelets aspirin, Eliquis.  4.  Acute kidney injury on CKD -Improved with IV fluids. -Consider scaling back fluids when clinically possible to prevent patient from going into CHF due to severely reduced EF. -Nephrology following.  5.  Severe MR status post mitral clip -Currently moderate MR on TEE -No additional intervention planned  Total encounter time 35 minutes  Greater than 50% was spent in counseling and coordination of care with the patient      Signed, Kate Sable, MD  10/10/2020, 3:07 PM

## 2020-10-10 NOTE — Plan of Care (Signed)
Pt A&O x 2, Maps> 65 and or SBP > 90 per order parameters, pt remains on pressor support for BP. Remains SR on monitor with occasional PVCs, on RA with intermittent non productive cough. UOP adequate see epic for I/O. Pt restless overnight, no significant events overnight. Handoff report given to oncoming primary RN. Care relinquished to oncoming RN.   Problem: Education: Goal: Knowledge of General Education information will improve Description: Including pain rating scale, medication(s)/side effects and non-pharmacologic comfort measures Outcome: Progressing   Problem: Health Behavior/Discharge Planning: Goal: Ability to manage health-related needs will improve Outcome: Progressing   Problem: Clinical Measurements: Goal: Ability to maintain clinical measurements within normal limits will improve Outcome: Progressing Goal: Will remain free from infection Outcome: Progressing Goal: Diagnostic test results will improve Outcome: Progressing Goal: Respiratory complications will improve Outcome: Progressing Goal: Cardiovascular complication will be avoided Outcome: Progressing   Problem: Activity: Goal: Risk for activity intolerance will decrease Outcome: Progressing   Problem: Nutrition: Goal: Adequate nutrition will be maintained Outcome: Progressing   Problem: Coping: Goal: Level of anxiety will decrease Outcome: Progressing   Problem: Elimination: Goal: Will not experience complications related to bowel motility Outcome: Progressing Goal: Will not experience complications related to urinary retention Outcome: Progressing   Problem: Pain Managment: Goal: General experience of comfort will improve Outcome: Progressing   Problem: Safety: Goal: Ability to remain free from injury will improve Outcome: Progressing   Problem: Skin Integrity: Goal: Risk for impaired skin integrity will decrease Outcome: Progressing

## 2020-10-10 NOTE — Progress Notes (Signed)
Patients two hearing aids sent home with patient's sister to prevent losing them during the hospitalization. Patient is unable to manage/keep track of the aids, and they were found wrapped in his bed linens during a bath earlier in the day.

## 2020-10-10 NOTE — Consult Note (Signed)
Consultation Note Date: 10/10/2020   Patient Name: Carl [name]  DOB: [birth year]  MRN: [id number]  Age / Sex: 70 y.o., male  PCP: [name], MD Referring Physician: Ottie Glazier, MD  Reason for Consultation: Establishing goals of care  HPI/Patient Profile: 70 y.o. male  with past medical history of chronic systolic heart failure (LVEF 20 to 25%), ICD, anxiety, OA, CKD, cirrhosis of the liver, a fib, GERD, GI bleed, mitral valve repair, HLD, CAD, PVD, tubular adenoma of the colon, and peptic ulcer disease admitted on 10/08/2020 with weakness and AMS. He was noted to be in shock, bradycardic, hypothermic, profoundly anemic and an acute kidney injury. The underlying cause of his shock was felt to be hypovolemia from diuretic use, poor p.o. intake, and possibly a slow chronic GI bleed. Shock complicated by cardiomyopathy. Requiring levophed drip, weaning down. Also with history of frailty, failure to thrive, and physical deconditioning. PMT consulted to discuss [place].  Clinical Assessment and Goals of Care: I have reviewed medical records including EPIC notes, labs and imaging, received report from RN, assessed the patient and then met with patient's sister and daughter to discuss diagnosis prognosis, GOC, EOL wishes, disposition and options.  I introduced Palliative Medicine as specialized medical care for people living with serious illness. It focuses on providing relief from the symptoms and stress of a serious illness. The goal is to improve quality of life for both the patient and the family.  Family shares that patient stays alone at his home during the day and goes to spend the night at his daughter's home every night. As far as functional and nutritional status, they share of a decline. They tell me of significant weight loss - about 30 pounds. They speak of decreased appetite. They also tell me of weakness, requiring walker. They tell me  his weakness has been exacerbated over the past couple of weeks.    We discussed patient's current illness and what it means in the larger context of patient's on-going co-morbidities.  Natural disease trajectory and expectations at EOL were discussed. We discuss his chronic diseases of heart failure, cirrhosis, and CKD. We discuss his overall frailty.   I attempted to elicit values and goals of care important to the patient.  We discuss that patient has expressed wishes to avoid aggressive interventions in the past. He has been clear he would never want HD, CPR, or mechanical ventilation. I share that these decisions are medically appropriate as patient would likely not do well with them d/t his frailty and chronic medical conditions.   Discussed with family the importance of continued conversation with family and the medical providers regarding overall plan of care and treatment options, ensuring decisions are within the context of the patient's values and GOCs.    We discuss transition of focus to quality of life instead of prolonging life.  Hospice services outpatient were explained and offered. Discuss philosophy of hospice care and type of support provided. Family expresses interest in hospice care if patient goes home; however, they have not completely excluded going to rehab. Will reevaluate throughout hospitalization. Patient has stated he does not want to go to rehab.   Questions and concerns were addressed. The family was encouraged to call with questions or concerns.   Primary Decision Maker NEXT OF KIN    SUMMARY OF RECOMMENDATIONS   - ongoing [place] discussions, will f/u tomorrow - family interested in hospice care at home following stabilization and discharge though still open to rehab depending  on how patient does - patient's wishes clear for DNR, NO HD  Code Status/Advance Care Planning:  DNR  Discharge Planning: To Be Determined      Primary Diagnoses: Present on  Admission: . Shock circulatory ([place])   I have reviewed the medical record, interviewed the patient and family, and examined the patient. The following aspects are pertinent.  Past Medical History:  Diagnosis Date  . AICD (automatic cardioverter/defibrillator) present   . Anemia   . Anxiety   . Arthritis   . Brunner's gland hyperplasia of duodenum   . CHF (congestive heart failure) ([place])   . Chronic kidney disease   . Cirrhosis of liver ([place])   . Coronary artery disease   . Depression   . Dysrhythmia    atrial fib  . External hemorrhoids   . Fracture of femoral neck, left ([place]) 07/21/2018  . GERD (gastroesophageal reflux disease)   . Gout   . Hearing loss   . Heart murmur    mirtal valve insufficiency  . Hepatic failure ([place])   . HH (hiatus hernia)   . Hyperlipidemia   . Hypokalemia   . Hyposmolality   . Intellectual disability    mild  . Internal hemorrhoids   . Ischemic cardiomyopathy   . Leg fracture, left   . Myocardial infarction ([place])   . Osteoporosis   . Paronychia   . Peripheral vascular disease ([place])   . Pneumonia   . Psoriasis   . Psoriasis   . PUD (peptic ulcer disease)   . Schatzki's ring   . Tubular adenoma of colon   . Vitiligo    Social History   Socioeconomic History  . Marital status: Single    Spouse name: Not on file  . Number of children: Not on file  . Years of education: Not on file  . Highest education level: Not on file  Occupational History  . Not on file  Tobacco Use  . Smoking status: Former Smoker    Quit date: 2014    Years since quitting: 8.1  . Smokeless tobacco: Never Used  . Tobacco comment: 07/30/2017 Quit in 2014  Vaping Use  . Vaping Use: Never used  Substance and Sexual Activity  . Alcohol use: No    Alcohol/week: 0.0 standard drinks  . Drug use: No  . Sexual activity: Never  Other Topics Concern  . Not on file  Social History Narrative   Pt lives in [place] with his mother.   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 Valvular heart disease Mother        Ruptured valve  . CAD Father   . Heart Problems Brother        Stents x 4  . Diabetes Brother   . Prostate cancer Neg Hx   . Bladder Cancer Neg Hx   . Kidney cancer Neg Hx    Scheduled Meds: . amiodarone  200 mg Oral Daily  . atorvastatin  40 mg Oral Daily  . Chlorhexidine Gluconate Cloth  6 each Topical Daily  . mouth rinse  15 mL Mouth Rinse BID  . midodrine  10 mg Oral TID WC  . pantoprazole  40 mg Oral Daily   Continuous Infusions: . sodium chloride Stopped (10/09/20 0745)  . sodium chloride 100 mL/hr at 10/10/20 1300  . norepinephrine (LEVOPHED) Adult infusion 4 mcg/min (10/10/20 1300)   PRN Meds:.docusate sodium, HYDROcodone-acetaminophen, morphine injection, ondansetron (ZOFRAN) IV, polyethylene glycol, traZODone Allergies  Allergen Reactions  . Spironolactone Other (See Comments)    gynecomastia    Review of Systems  Unable to perform ROS: Mental status change    Physical Exam Constitutional:      General: He is not in acute distress.    Comments: drowsy  Pulmonary:     Effort: Pulmonary effort is normal.  Skin:    General: Skin is warm and dry.  Neurological:     Comments: Periods of confusion, hard of hearing makes communication difficult     Vital Signs: BP (!) 82/43   Pulse 71   Temp 97.7 F (36.5 C) (Oral)   Resp 14   Ht '5\' 10"'  (1.778 m)   Wt 64.1 kg   SpO2 96%   BMI 20.28 kg/m  Pain Scale: 0-10   Pain Score: 0-No pain   SpO2: SpO2: 96 % O2 Device:SpO2: 96 % O2 Flow Rate: .   IO: Intake/output summary:   Intake/Output Summary (Last 24 hours) at 10/10/2020 1552 Last data filed at 10/10/2020 1343 Gross  per 24 hour  Intake 2877.75 ml  Output 5305 ml  Net -2427.25 ml    LBM: Last BM Date: 10/08/20 Baseline Weight: Weight: 70 kg Most recent weight: Weight: 64.1 kg     Palliative Assessment/Data: PPS 40%     Time Total: 75 minutes  Greater than 50%  of this time was spent counseling and coordinating care related to the above assessment and plan.  Juel Burrow, DNP, AGNP-C Palliative Medicine Team [phone] Pager: [phone]

## 2020-10-10 NOTE — Evaluation (Signed)
Occupational Therapy Evaluation Patient Details Name: Carl [name] MRN: [id number] DOB: [birth year] Today's Date: 10/10/2020    History of Present Illness BASCOM BIEL is a 70 y.o. male with medical history significant for chronic systolic heart failure with most recent echocardiogram showing LVEF 20% status post AICD placement, paroxysmal atrial fibrillation chronically anticoagulated on Eliquis, stage IV chronic kidney disease with baseline creatinine 2.2-2.7, chronic blood loss anemia with baseline hemoglobin of 7-9, cirrhosis.  He came in 2/14, noted to be in shock, bradycardic, hypothermic, profoundly anemic and an acute kidney injury.  Critical care was consulted for further evaluation, requiring vasopressors.   Clinical Impression   Mr Aumiller was seen for OT evaluation this date. Prior to hospital admission, pt was MOD I for limited mobility and ADLs. Pt lives alone in home c ramped entrance, per sister at bedside pt would spend nights at his daughters house. Pt presents to acute OT demonstrating impaired ADL performance and functional mobility 2/2 decreased activity tolerance, functional strength/balance/ROM deficits, and poor safety awareness.   Pt currently requires MAX A for LBD at bed level. MAX A self-drinking seated EOB - pt requires BUE support 2/2 posterior lean. MOD A sit<>stand, decreasing to MAX A for standing balance 2/2 significant posterior lean and poor righting relfex. Tolerates <30 seconds standing for x3 attempts each. Pt would benefit from skilled OT to address noted impairments and functional limitations (see below for any additional details) in order to maximize safety and independence while minimizing falls risk and caregiver burden. Upon hospital discharge, recommend STR to maximize pt safety and return to PLOF.  SUPINE: BP 93/56 SEATED: BP 114/55    Follow Up Recommendations  SNF    Equipment Recommendations  Other (comment) (TBD)    Recommendations for Other  Services       Precautions / Restrictions Precautions Precautions: Fall Restrictions Weight Bearing Restrictions: No      Mobility Bed Mobility Overal bed mobility: Needs Assistance Bed Mobility: Supine to Sit;Sit to Supine     Supine to sit: Mod assist Sit to supine: Mod assist        Transfers Overall transfer level: Needs assistance Equipment used: 1 person hand held assist Transfers: Sit to/from Stand Sit to Stand: Mod assist         General transfer comment: MOD A sit<>stand decreasing to MAX A in standing 2/2 significant posterior lean    Balance Overall balance assessment: Needs assistance Sitting-balance support: Bilateral upper extremity supported Sitting balance-Leahy Scale: Poor Sitting balance - Comments: unable to correct posterior lean Postural control: Posterior lean Standing balance support: Bilateral upper extremity supported Standing balance-Leahy Scale: Zero Standing balance comment: zero righting reflex or awareness of posterior lean                           ADL either performed or assessed with clinical judgement   ADL Overall ADL's : Needs assistance/impaired                                       General ADL Comments: MAX A for LBD at bed level. MAX A self-drinkingn seated EOB - pt requires BUE support 2/2 posterior lean.                  Pertinent Vitals/Pain Pain Assessment: 0-10 Pain Score: 5  Pain Location: back Pain Descriptors / Indicators:  Discomfort;Dull;Grimacing Pain Intervention(s): Limited activity within patient's tolerance;Repositioned;Patient requesting pain meds-RN notified     Hand Dominance     Extremity/Trunk Assessment Upper Extremity Assessment Upper Extremity Assessment: Generalized weakness (B grip 3/5, decreased proximal strength)   Lower Extremity Assessment Lower Extremity Assessment: Generalized weakness       Communication Communication Communication: HOH    Cognition Arousal/Alertness: Awake/alert Behavior During Therapy: WFL for tasks assessed/performed Overall Cognitive Status: Difficult to assess                                 General Comments: Difficult to understand and HOH making some communication difficult.  A&Ox4 but showing poor safety awareness.   General Comments  SUPINE: BP 93/56. SEATED: BP 114/55    Exercises Exercises: Other exercises Other Exercises Other Exercises: Pt and family educated re: OT role, DME recs, d/c recs, falls prevention, ECS, HEP Other Exercises: LBD, sup<>sit, sit<>stand x3, sitting/standing balance/tolerance   Shoulder Instructions      Home Living Family/patient expects to be discharged to:: [facility] [place]: Alone;Children (alone at his house during day, stays with daughter at night at her house) Available Help at Discharge: Family;Available PRN/intermittently   Home Access: Ramped entrance     Home Layout: One level     Bathroom Shower/Tub: Occupational psychologist: Standard Bathroom Accessibility: Yes   Home Equipment: Cane - quad;Walker - standard;Grab bars - tub/shower;Shower seat   Additional Comments: Sister at bed side reports pt is alone in his house during day - ramped entrance. Then at his daughters house during night - 5 STE, family assists      Prior Functioning/Environment Level of Independence: Needs assistance  Gait / Transfers Assistance Needed: Mod I for ambulation with RW; intermittent assistance from brother/sister for community ambulation and stair navigation for safety. ADL's / Homemaking Assistance Needed: Per notes, pt mod I with dressing, but requires assistance/supevision for bathing/toileting for safety. Family assists with meal prep and medication management.            OT Problem List: Decreased strength;Decreased range of motion;Decreased activity tolerance;Impaired balance (sitting and/or  standing);Decreased safety awareness;Impaired UE functional use      OT Treatment/Interventions: Self-care/ADL training;Therapeutic exercise;Energy conservation;DME and/or AE instruction;Therapeutic activities;Patient/family education;Balance training    OT Goals(Current goals can be found in the care plan section) Acute Rehab OT Goals Patient Stated Goal: get well enough to go home OT Goal Formulation: With patient Time For Goal Achievement: 10/24/20 Potential to Achieve Goals: Good ADL Goals Pt Will Perform Grooming: with supervision;with set-up;sitting Pt Will Perform Lower Body Dressing: with mod assist;sit to/from stand (c LRAD PRN) Pt Will Transfer to Toilet: with min guard assist;stand pivot transfer;bedside commode (c LRAD PRN)  OT Frequency: Min 1X/week   Barriers to D/C: Decreased caregiver support             AM-PAC OT "6 Clicks" Daily Activity     Outcome Measure Help from another person eating meals?: A Little Help from another person taking care of personal grooming?: A Lot Help from another person toileting, which includes using toliet, bedpan, or urinal?: A Lot Help from another person bathing (including washing, rinsing, drying)?: A Lot Help from another person to put on and taking off regular upper body clothing?: A Little Help from another person to put on and taking off regular lower body clothing?: A Lot 6 Click Score: 14  End of Session Nurse Communication: Mobility status  Activity Tolerance: Patient tolerated treatment well Patient left: in bed;with call bell/phone within reach;with family/visitor present  OT Visit Diagnosis: Other abnormalities of gait and mobility (R26.89);Muscle weakness (generalized) (M62.81)                Time: 9458-5929 OT Time Calculation (min): 29 min Charges:  OT General Charges $OT Visit: 1 Visit OT Evaluation $OT Eval Moderate Complexity: 1 Mod OT Treatments $Self Care/Home Management : 23-37 mins  Dessie Coma,  M.S. OTR/L  10/10/20, 3:45 PM  ascom [phone]

## 2020-10-10 NOTE — Evaluation (Signed)
Physical Therapy Evaluation Patient Details Name: Carl [name] MRN: [id number] DOB: Jan 15, 1951 Today's Date: 10/10/2020   History of Present Illness  Carl [name] is a 70 y.o. male with medical history significant for chronic systolic heart failure with most recent echocardiogram showing LVEF 20% status post AICD placement, paroxysmal atrial fibrillation chronically anticoagulated on Eliquis, stage IV chronic kidney disease with baseline creatinine 2.2-2.7, chronic blood loss anemia with baseline hemoglobin of 7-9, cirrhosis.  He came in 2/14, noted to be in shock, bradycardic, hypothermic, profoundly anemic and an acute kidney injury.  Critical care was consulted for further evaluation, requiring vasopressors.  Clinical Impression  Pt with some general confusion t/o the session, but willing and able to participate.  He showed ability to assist with mobility to/from sitting, but struggled to initiate effort to standing and maintained posterior lean the entire time needing constant (and sometimes heavy) assist to keep weight shifting forward.  Apparently at baseline he is able to do regular ambulation with minimal reliance on AD, not so today.  He did not have any BP symptoms with numbers staying stable in various positions.  Pt will need STR at discharge, prefers [facility] if possible.    Follow Up Recommendations SNF;Supervision for mobility/OOB    Equipment Recommendations   (TBD at next venue of care)    Recommendations for Other Services       Precautions / Restrictions Precautions Precautions: Fall Restrictions Weight Bearing Restrictions: No      Mobility  Bed Mobility Overal bed mobility: Needs Assistance Bed Mobility: Supine to Sit;Sit to Supine     Supine to sit: Min assist Sit to supine: Min assist   General bed mobility comments: Pt was able to assist with getting to EOB, struggled more with getting back into bed but did not need excessive assist with either  transition    Transfers Overall transfer level: Needs assistance Equipment used: Rolling walker (2 wheeled) Transfers: Sit to/from Stand Sit to Stand: Max assist         General transfer comment: Pt struggled to get feet set under him and really give a good push to standing.  PT did need to give assist to initiate/rise, but more significantly needed to maintain constant contact to keep weight forward as he was leaning back on bed/PT the entire time even with heavy verbal and tactile cuing  Ambulation/Gait             General Gait Details: unable/unsafe to ambulate, did manage a few uncoordinated and laborious side steps along EOB, needing heavy assist to keep weight forward and pt upright.  Stairs            Wheelchair Mobility    Modified Rankin (Stroke Patients Only)       Balance Overall balance assessment: Needs assistance Sitting-balance support: Bilateral upper extremity supported Sitting balance-Leahy Scale: Fair Sitting balance - Comments: Pt able to maintain sitting balance without direct assist   Standing balance support: Bilateral upper extremity supported Standing balance-Leahy Scale: Poor Standing balance comment: Pt leaning back on bed/PT, unable to shift weight fully to walker/forward                             Pertinent Vitals/Pain Pain Assessment: No/denies pain    Home Living Family/patient expects to be discharged to:: Skilled nursing facility Living Arrangements:  (pt states he lives alone, prior notes state he is with child(ren?)) Available Help at  Discharge: Family;Available PRN/intermittently   Home Access: Stairs to enter Entrance Stairs-Rails: Can reach both Entrance Stairs-Number of Steps: 3 Home Layout: One level Home Equipment: Cane - quad;Walker - standard;Grab bars - tub/shower;Shower seat Additional Comments: Pt HOH and with some confusion, most home info taken from recent notes    Prior Function Level of  Independence: Needs assistance   Gait / Transfers Assistance Needed: Mod I for ambulation with RW; intermittent assistance from brother/sister for community ambulation and stair navigation for safety.  ADL's / Homemaking Assistance Needed: Per notes, pt mod I with dressing, but requires assistance/supevision for bathing/toileting for safety. Family assists with meal prep and medication management.        Hand Dominance        Extremity/Trunk Assessment   Upper Extremity Assessment Upper Extremity Assessment: Generalized weakness (unable to elevate >90 b/l, grossly 3/5 in available range)    Lower Extremity Assessment Lower Extremity Assessment: Generalized weakness       Communication   Communication: HOH  Cognition Arousal/Alertness: Awake/alert Behavior During Therapy: WFL for tasks assessed/performed Overall Cognitive Status: Difficult to assess                                 General Comments: Difficult to understand and HOH making some communication difficult.  A&Ox2 but showing poor awareness and struggled to stay on task.      General Comments General comments (skin integrity, edema, etc.): repeated BPs in supine and sitting: generally 90/60 range (+/- 5), no symptoms    Exercises     Assessment/Plan    PT Assessment Patient needs continued PT services  PT Problem List Decreased strength;Decreased range of motion;Decreased activity tolerance;Decreased balance;Decreased mobility;Decreased coordination;Decreased cognition;Decreased knowledge of use of DME;Decreased safety awareness;Cardiopulmonary status limiting activity       PT Treatment Interventions Gait training;Stair training;DME instruction;Functional mobility training;Therapeutic activities;Therapeutic exercise;Patient/family education;Cognitive remediation;Neuromuscular re-education;Balance training    PT Goals (Current goals can be found in the Care Plan section)  Acute Rehab PT  Goals Patient Stated Goal: get well enough to go home PT Goal Formulation: With patient Time For Goal Achievement: 10/24/20 Potential to Achieve Goals: Fair    Frequency Min 2X/week   Barriers to discharge        Co-evaluation               AM-PAC PT "6 Clicks" Mobility  Outcome Measure Help needed turning from your back to your side while in a flat bed without using bedrails?: A Little Help needed moving from lying on your back to sitting on the side of a flat bed without using bedrails?: A Little Help needed moving to and from a bed to a chair (including a wheelchair)?: A Lot Help needed standing up from a chair using your arms (e.g., wheelchair or bedside chair)?: A Lot Help needed to walk in hospital room?: Total Help needed climbing 3-5 steps with a railing? : Total 6 Click Score: 12    End of Session Equipment Utilized During Treatment: Gait belt Activity Tolerance: Patient tolerated treatment well Patient left: with call bell/phone within reach;in bed Nurse Communication: Mobility status PT Visit Diagnosis: Muscle weakness (generalized) (M62.81);Difficulty in walking, not elsewhere classified (R26.2);Unsteadiness on feet (R26.81)    Time: 3785-8850 PT Time Calculation (min) (ACUTE ONLY): 29 min   Charges:   PT Evaluation $PT Eval Low Complexity: 1 Low PT Treatments $Therapeutic Activity: 8-22 mins  Kreg Shropshire, DPT 10/10/2020, 12:05 PM

## 2020-10-10 NOTE — Progress Notes (Signed)
NAME:  Carl [name], MRN:  [id number], DOB:  04-21-1951, LOS: 2 ADMISSION DATE:  10/08/2020, CONSULTATION DATE:  2/14 REFERRING MD:  Jacqualine Code, CHIEF COMPLAINT:  weakness   Brief History:  70 y/o male with extensive past medical history presented to the [facility] ED on 10/09/19 with a chief complaint of generalized weakness and change in mental status.  He was noted to be in shock, bradycardic, hypothermic, profoundly anemic and an acute kidney injury.  Critical care was consulted for further evaluation as he was requiring vasopressors.  The underlying cause of his shock was felt to be hypovolemia from diuretic use, poor p.o. intake, and possibly a slow chronic GI bleed. Past Medical History:  Chronic systolic heart failure, LVEF 20 to 25%, followed by Dr. Algernon Huxley.  Has ICD in place Anxiety Osteoarthritis Chronic kidney disease Cirrhosis of the liver Atrial fibrillation External hemorrhoids GERD History of GI bleeding felt to be possibly related to AVM Status post mitral valve repair Hyperlipidemia Intellectual disability History of coronary artery disease status post MI Peripheral vascular disease Tubular adenoma of the colon Schatzki's ring Peptic ulcer disease Psoriasis Vitiligo   Events:  2/15 admission  10/10/2020- Patient has improved, he is awake and sitting up in bed, weaning off of vasopressors today, continue IVF.  Net 2.5L positive fluid balance, will initiate PT/OT in next 12-24h . Goals of care meeting with family 10/10/2020  Consults:  PCCM Nephrology Cardiology GI  Procedures:  none  Significant Diagnostic Tests:  10/09/2020 US renal >> no acute abnormality, no hydronephrosis  Micro Data:  February 14 SARS-CoV-2 > negative, influenza negative February 14 blood culture >  10/08/2020 MRSA >> negative Antimicrobials:    Objective   Blood pressure (!) 107/59, pulse 75, temperature (!) 97.5 F (36.4 C), temperature source Oral, resp. rate 15, height 5\' 10"  (1.778  m), weight 64.1 kg, SpO2 98 %.        Intake/Output Summary (Last 24 hours) at 10/10/2020 0949 Last data filed at 10/10/2020 0800 Gross per 24 hour  Intake 3361.77 ml  Output 5650 ml  Net -2288.23 ml   Filed Weights   10/08/20 1233 10/08/20 1653  Weight: 70 kg 64.1 kg    Examination: General: Adult male age approprate mildly confused sitting up in bed HEENT: MM pink/ dry, anicteric, atraumatic, neck supple Neuro: Awake alert able to respond slowly to verbal communication with confusion CV: s1s2 RRR, SB  on monitor, no r/m/g Pulm: Regular, non labored on room air, breath sounds diminished throughout GI: soft, rounded, non tender (?), bs x 4 GU: foley in place with clear yellow urine Skin: limited exam-no rashes/lesions noted Extremities: warm/dry, pulses + 2 R/P, no edema noted  Resolved Hospital Problem list     Assessment & Plan:  Hypovolemic shock - in the setting of diuresis -hx of chf - cardio on case - appreciate input -hx of chronic slow GI bleeding , anticoagulation has been held  - GI is not planning EGD/C-scope at this time  Junctional bradycardia due to hyperkalemia and hypothermia: Chronic systolic heart failure PMHx: A-fib Received Lokelma x 2. Unable to restart anticoagulation due to chronic GIB  - continuous cardiac monitoring - correct hyperkalemia - cardiology following, do not recommend restarting anticoagulation, palliative consult recommended - hold home coreg, amiodarone   Chronic GI bleed, no evidence of acute GI bleeding: - Monitor for s/s of bleeding - Daily CBC - Transfuse for Hgb <7 - SCD's for DVT prophylaxis - home anticoagulation on hold -  continue home protonix  Acute kidney injury due to hypovolemia: not a candidate for dialysis Lokelma dosed x 2. Patient documented stating he would not want HD. - Strict I/O's: alert provider if UOP < 0.5 mL/kg/hr - Daily BMP, replace electrolytes PRN - Avoid nephrotoxic agents as able, ensure  adequate renal perfusion - Nephrology following, appreciate input - Closely monitor K+, consider veltassa/ additional lokemla doses if recheck K+ remains > 5  Cirrhosis without evidence of ascites - home diuretics on hold (metolazone & torsemide) - trend hepatic function  Acute metabolic encephalopathy due to hypovolemia, shock, anemia - hold home gabapentin - supportive care, including reorientation - minimize sedative medications  Gout: No evidence of flareup - hold allopurinol  Also present on admission Frailty Failure to thrive Physical deconditoining  Best practice (evaluated daily)  Diet: NPO Pain/Anxiety/Delirium protocol (if indicated): n/a VAP protocol (if indicated): n/a DVT prophylaxis: SCD GI prophylaxis: Oral ppi Glucose control: monitor Mobility: bed rest, mobilize as tolerated Disposition: ICU  Goals of Care:  Last date of multidisciplinary goals of care discussion: 2/14 Family and staff present: Lake Bells, daughter Barnett Applebaum Summary of discussion: she feels that he is tired and would not want prolonged aggressive care.  He has never given her clear direction about hemodialysis but when I briefly described it she said that she didn't think he would want it.   Follow up goals of care discussion due: 2/21 Code Status: DNR  Labs   CBC: Recent Labs  Lab 10/08/20 1244 10/08/20 1910 10/09/20 0349 10/10/20 0652  WBC 6.0 7.0 7.6 7.6  NEUTROABS 4.4  --   --   --   HGB 6.6* 10.6* 10.1* 10.7*  HCT 21.3* 33.0* 30.6* 32.8*  MCV 102.9* 96.8 94.4 94.8  PLT 356 386 381 403    Basic Metabolic Panel: Recent Labs  Lab 10/08/20 1435 10/08/20 1910 10/09/20 0349 10/09/20 1253 10/10/20 0652  NA 124* 124* 126* 125* 130*  K 5.8* 6.2* 5.9* 5.2* 4.3  CL 91* 91* 89* 89* 95*  CO2 22 22 25 26 27   GLUCOSE 88 119* 98 113* 114*  BUN 102* 98* 102* 95* 74*  CREATININE 4.73* 4.70* 4.46* 4.01* 2.82*  CALCIUM 8.1* 8.7* 8.7* 8.9 8.7*  MG  --   --  2.5*  --  2.1  PHOS  --   --   6.4*  --  4.1   GFR: Estimated Creatinine Clearance: 22.4 mL/min (A) (by C-G formula based on SCr of 2.82 mg/dL (H)). Recent Labs  Lab 10/08/20 1234 10/08/20 1244 10/08/20 1910 10/09/20 0349 10/10/20 0652  WBC  --  6.0 7.0 7.6 7.6  LATICACIDVEN 1.0  --   --   --   --     Liver Function Tests: Recent Labs  Lab 10/08/20 1244  AST 27  ALT 19  ALKPHOS 129*  BILITOT 0.8  PROT 5.9*  ALBUMIN 2.7*   No results for input(s): LIPASE, AMYLASE in the last 168 hours. No results for input(s): AMMONIA in the last 168 hours.  ABG    Component Value Date/Time   PHART 7.437 12/27/2018 0957   PCO2ART 35.5 12/27/2018 0957   PO2ART 99.6 12/27/2018 0957   HCO3 23.6 12/27/2018 0957   TCO2 34 (H) 11/09/2019 0924   ACIDBASEDEF 0.1 12/27/2018 0957   O2SAT 97.4 12/27/2018 0957     Coagulation Profile: Recent Labs  Lab 10/08/20 1244  INR 1.6*    Cardiac Enzymes: No results for input(s): CKTOTAL, CKMB, CKMBINDEX, TROPONINI in the last  168 hours.  HbA1C: Hgb A1c MFr Bld  Date/Time Value Ref Range Status  12/27/2018 09:58 AM 5.9 (H) 4.8 - 5.6 % Final    Comment:    (NOTE) Pre diabetes:          5.7%-6.4% Diabetes:              >6.4% Glycemic control for   <7.0% adults with diabetes   06/11/2017 05:40 PM 5.3 4.8 - 5.6 % Final    Comment:    (NOTE) Pre diabetes:          5.7%-6.4% Diabetes:              >6.4% Glycemic control for   <7.0% adults with diabetes     CBG: Recent Labs  Lab 10/08/20 1619 10/08/20 1651 10/10/20 0732  GLUCAP 87 91 111*      Critical care time: 109 minutes     Critical care provider statement:    Critical care time (minutes):  35   Critical care time was exclusive of:  Separately billable procedures and  treating other patients   Critical care was necessary to treat or prevent imminent or  life-threatening deterioration of the following conditions:  circulatory shock, aki, chronic anemia, chronic gi bleed, multiple comorbid  conditions   Critical care was time spent personally by me on the following  activities:  Development of treatment plan with patient or surrogate,  discussions with consultants, evaluation of patient's response to  treatment, examination of patient, obtaining history from patient or  surrogate, ordering and performing treatments and interventions, ordering  and review of laboratory studies and re-evaluation of patient's condition   I assumed direction of critical care for this patient from another  provider in my specialty: no      Ottie Glazier, M.D.  Pulmonary & [place]

## 2020-10-10 NOTE — Progress Notes (Signed)
[facility]  ROUNDING NOTE   Subjective:   UOP 5863mLmL Creatinine 2.82 (4.01) Na 130 (125) K 4.3 (5.2)  Afebrile Mental status has improved.  Off norepinephrine this morning when examined.    Objective:  Vital signs in last 24 hours:  Temp:  [97.5 F (36.4 C)-97.7 F (36.5 C)] 97.7 F (36.5 C) (02/16 0800) Pulse Rate:  [56-80] 71 (02/16 1045) Resp:  [12-20] 20 (02/16 1045) BP: (77-116)/(48-87) 94/53 (02/16 1045) SpO2:  [95 %-100 %] 97 % (02/16 1045)  Weight change:  Filed Weights   10/08/20 1233 10/08/20 1653  Weight: 70 kg 64.1 kg    Intake/Output: I/O last 3 completed shifts: In: 1610 [P.O.:700; I.V.:4707; IV Piggyback:50] Out: 7045 [Urine:7045]   Intake/Output this shift:  Total I/O In: 253.1 [I.V.:253.1] Out: 315 [Urine:315]  Physical Exam: General: ill  Head: Moist oral mucosal membranes  Eyes: +hard of hearing  Neck: Supple, trachea midline  Lungs:  Clear to auscultation  Heart: Regular rate and rhythm  Abdomen:  Soft, nontender,   Extremities: no peripheral edema.  Neurologic: confused  Skin: No lesions  Access: none    Basic Metabolic Panel: Recent Labs  Lab 10/08/20 1435 10/08/20 1910 10/09/20 0349 10/09/20 1253 10/10/20 0652  NA 124* 124* 126* 125* 130*  K 5.8* 6.2* 5.9* 5.2* 4.3  CL 91* 91* 89* 89* 95*  CO2 22 22 25 26 27   GLUCOSE 88 119* 98 113* 114*  BUN 102* 98* 102* 95* 74*  CREATININE 4.73* 4.70* 4.46* 4.01* 2.82*  CALCIUM 8.1* 8.7* 8.7* 8.9 8.7*  MG  --   --  2.5*  --  2.1  PHOS  --   --  6.4*  --  4.1    Liver Function Tests: Recent Labs  Lab 10/08/20 1244  AST 27  ALT 19  ALKPHOS 129*  BILITOT 0.8  PROT 5.9*  ALBUMIN 2.7*   No results for input(s): LIPASE, AMYLASE in the last 168 hours. No results for input(s): AMMONIA in the last 168 hours.  CBC: Recent Labs  Lab 10/08/20 1244 10/08/20 1910 10/09/20 0349 10/10/20 0652  WBC 6.0 7.0 7.6 7.6  NEUTROABS 4.4  --   --   --   HGB 6.6* 10.6*  10.1* 10.7*  HCT 21.3* 33.0* 30.6* 32.8*  MCV 102.9* 96.8 94.4 94.8  PLT 356 386 381 386    Cardiac Enzymes: No results for input(s): CKTOTAL, CKMB, CKMBINDEX, TROPONINI in the last 168 hours.  BNP: Invalid input(s): POCBNP  CBG: Recent Labs  Lab 10/08/20 1619 10/08/20 1651 10/10/20 0732  GLUCAP 87 91 111*    Microbiology: Results for orders placed or performed during the hospital encounter of 10/08/20  Blood Culture (routine x 2)     Status: None (Preliminary result)   Collection Time: 10/08/20 12:35 PM   Specimen: BLOOD  Result Value Ref Range Status   Specimen Description BLOOD LEFT ANTECUBITAL  Final   Special Requests   Final    BOTTLES DRAWN AEROBIC AND ANAEROBIC Blood Culture adequate volume   Culture   Final    NO GROWTH 2 DAYS Performed at [facility], [address] [place], [place] [address]    Report Status PENDING  Incomplete  Blood Culture (routine x 2)     Status: None (Preliminary result)   Collection Time: 10/08/20 12:35 PM   Specimen: BLOOD  Result Value Ref Range Status   Specimen Description BLOOD RIGHT ANTECUBITAL  Final   Special Requests   Final  BOTTLES DRAWN AEROBIC AND ANAEROBIC Blood Culture adequate volume   Culture   Final    NO GROWTH 2 DAYS Performed at [facility], [address] [place], [place] [address]    Report Status PENDING  Incomplete  Resp Panel by RT-PCR (Flu A&B, Covid) Nasopharyngeal Swab     Status: None   Collection Time: 10/08/20 12:44 PM   Specimen: Nasopharyngeal Swab; Nasopharyngeal(NP) swabs in vial transport medium  Result Value Ref Range Status   SARS Coronavirus 2 by RT PCR NEGATIVE NEGATIVE Final    Comment: (NOTE) SARS-CoV-2 target nucleic acids are NOT DETECTED.  The SARS-CoV-2 RNA is generally detectable in upper respiratory specimens during the acute phase of infection. The lowest concentration of SARS-CoV-2 viral copies this assay can detect is 138 copies/mL. A negative  result does not preclude SARS-Cov-2 infection and should not be used as the sole basis for treatment or other patient management decisions. A negative result may occur with  improper specimen collection/handling, submission of specimen other than nasopharyngeal swab, presence of viral mutation(s) within the areas targeted by this assay, and inadequate number of viral copies(<138 copies/mL). A negative result must be combined with clinical observations, patient history, and epidemiological information. The expected result is Negative.  Fact Sheet for Patients:  EntrepreneurPulse.com.au  Fact Sheet for Healthcare Providers:  IncredibleEmployment.be  This test is no t yet approved or cleared by the [place] FDA and  has been authorized for detection and/or diagnosis of SARS-CoV-2 by FDA under an Emergency Use Authorization (EUA). This EUA will remain  in effect (meaning this test can be used) for the duration of the COVID-19 declaration under Section 564(b)(1) of the Act, 21 U.S.C.section 360bbb-3(b)(1), unless the authorization is terminated  or revoked sooner.       Influenza A by PCR NEGATIVE NEGATIVE Final   Influenza B by PCR NEGATIVE NEGATIVE Final    Comment: (NOTE) The Xpert Xpress SARS-CoV-2/FLU/RSV plus assay is intended as an aid in the diagnosis of influenza from Nasopharyngeal swab specimens and should not be used as a sole basis for treatment. Nasal washings and aspirates are unacceptable for Xpert Xpress SARS-CoV-2/FLU/RSV testing.  Fact Sheet for Patients: EntrepreneurPulse.com.au  Fact Sheet for Healthcare Providers: IncredibleEmployment.be  This test is not yet approved or cleared by the [place] FDA and has been authorized for detection and/or diagnosis of SARS-CoV-2 by FDA under an Emergency Use Authorization (EUA). This EUA will remain in effect (meaning this test can be used)  for the duration of the COVID-19 declaration under Section 564(b)(1) of the Act, 21 U.S.C. section 360bbb-3(b)(1), unless the authorization is terminated or revoked.  Performed at [facility], [address] [place], [place] [address]   MRSA PCR Screening     Status: None   Collection Time: 10/08/20  4:53 PM   Specimen: Nasopharyngeal  Result Value Ref Range Status   MRSA by PCR NEGATIVE NEGATIVE Final    Comment:        The GeneXpert MRSA Assay (FDA approved for NASAL specimens only), is one component of a comprehensive MRSA colonization surveillance program. It is not intended to diagnose MRSA infection nor to guide or monitor treatment for MRSA infections. Performed at [facility], [address] [place], [place] [address]    *Note: Due to a large number of results and/or encounters for the requested time period, some results have not been displayed. A complete set of results can be found in Results Review.  Coagulation Studies: Recent Labs    10/08/20 1244  LABPROT 18.5*  INR 1.6*    Urinalysis: No results for input(s): COLORURINE, LABSPEC, PHURINE, GLUCOSEU, HGBUR, BILIRUBINUR, KETONESUR, PROTEINUR, UROBILINOGEN, NITRITE, LEUKOCYTESUR in the last 72 hours.  Invalid input(s): APPERANCEUR    Imaging: US RENAL  Result Date: 10/09/2020 CLINICAL DATA:  Acute renal failure. EXAM: RENAL / URINARY TRACT ULTRASOUND COMPLETE COMPARISON:  None. FINDINGS: Right Kidney: Renal measurements: 9.3 x 4.7 x 3.7 cm = volume: 85.1 mL. Echogenicity within normal limits. No mass or hydronephrosis visualized. Left Kidney: Renal measurements: 9.5 x 5.7 x 4.7 cm = volume: 134.2 mL. Echogenicity within normal limits. No mass or hydronephrosis visualized. Bladder: Bladder is decompressed with a Foley catheter. Other: Incidental note is made of a heterogeneous lobular hepatic parenchymal pattern. Cirrhosis cannot be excluded. Mild ascites noted. Pleural effusions  noted. IMPRESSION: 1. No acute renal abnormality identified. No hydronephrosis or focal renal abnormality. 2. Bladder is decompressed with a Foley catheter. 3. Incidental note is made of a heterogeneous lobular hepatic parenchymal pattern. Cirrhosis cannot be excluded. Mild ascites noted. Pleural effusions noted. Electronically Signed   By: Marcello Moores  Register   On: 10/09/2020 07:45   DG Chest Port 1 View  Result Date: 10/08/2020 CLINICAL DATA:  Sepsis EXAM: PORTABLE CHEST 1 VIEW COMPARISON:  08/12/2020 FINDINGS: Cardiac shadow is enlarged but stable. Defibrillator and postsurgical changes are again noted. Old rib fractures are noted on the right with healing and callus formation. Previously seen vascular congestion has improved. No focal infiltrate is noted. IMPRESSION: Improved vascular congestion and edema when compare with the prior exam. No focal infiltrate is seen. Electronically Signed   By: Inez Catalina M.D.   On: 10/08/2020 13:13     Medications:   . sodium chloride Stopped (10/09/20 0745)  . sodium chloride 100 mL/hr at 10/10/20 0800  . norepinephrine (LEVOPHED) Adult infusion 6 mcg/min (10/10/20 1008)  . phenylephrine (NEO-SYNEPHRINE) Adult infusion     . amiodarone  200 mg Oral Daily  . atorvastatin  40 mg Oral Daily  . Chlorhexidine Gluconate Cloth  6 each Topical Daily  . mouth rinse  15 mL Mouth Rinse BID  . midodrine  10 mg Oral TID WC  . pantoprazole  40 mg Oral Daily   docusate sodium, HYDROcodone-acetaminophen, morphine injection, ondansetron (ZOFRAN) IV, polyethylene glycol, traZODone  Assessment/ Plan:  Mr. MYKLE PASCUA is a 70 y.o. white male with coronary artery disease status post mitral valve repair, hepatic cirrhosis, congestive heart failure, AICD placement, hypertension, hyperlipidemia, peripheral vascular disease, peptic ulcer disease, hearing loss, who was admitted to [facility] on 10/08/2020 for Hypovolemic shock ([place]) [R57.1] Hyperkalemia [E87.5] Bradycardia  [R00.1] Shock circulatory ([place]) [R57.9] Acute GI bleeding [K92.2] Dehydration, severe [E86.0] AKI (acute kidney injury) ([place]) [N17.9]  1. Acute kidney injury: with hyperkalemia: on chronic kidney disease stage IIIB. Baseline creatinine of 2.2 with GFR of 32 on 07/03/20. History of bland urine.  Renal ultrasound reviewed. - hold potassium chloride.  - Continue foley catheter - Continue IV fluids for one more day.  - Encourage PO intake.  - Low potassium diet - no acute indication for dialysis. Patient is documented as stating he was not interested in dialysis.    2. Hyponatremia: acute on chronic: baseline 130-135. home regimen includes metolazone - IV normal saline - Holding metolazone.   3. Anemia with acute kidney failure: Multiple PRBC transfusions. Hemoglobin stable currently    LOS: 2 Esteban Kobashigawa 2/16/202212:17 PM

## 2020-10-11 ENCOUNTER — Inpatient Hospital Stay: Payer: Medicare Other

## 2020-10-11 DIAGNOSIS — K922 Gastrointestinal hemorrhage, unspecified: Secondary | ICD-10-CM | POA: Diagnosis not present

## 2020-10-11 DIAGNOSIS — N170 Acute kidney failure with tubular necrosis: Secondary | ICD-10-CM | POA: Diagnosis not present

## 2020-10-11 DIAGNOSIS — R571 Hypovolemic shock: Secondary | ICD-10-CM

## 2020-10-11 DIAGNOSIS — R001 Bradycardia, unspecified: Secondary | ICD-10-CM | POA: Diagnosis not present

## 2020-10-11 DIAGNOSIS — E860 Dehydration: Secondary | ICD-10-CM

## 2020-10-11 LAB — CBC
HCT: 29.8 % — ABNORMAL LOW (ref 39.0–52.0)
Hemoglobin: 9.5 g/dL — ABNORMAL LOW (ref 13.0–17.0)
MCH: 30.5 pg (ref 26.0–34.0)
MCHC: 31.9 g/dL (ref 30.0–36.0)
MCV: 95.8 fL (ref 80.0–100.0)
Platelets: 376 10*3/uL (ref 150–400)
RBC: 3.11 MIL/uL — ABNORMAL LOW (ref 4.22–5.81)
RDW: 18.9 % — ABNORMAL HIGH (ref 11.5–15.5)
WBC: 7 10*3/uL (ref 4.0–10.5)
nRBC: 0 % (ref 0.0–0.2)

## 2020-10-11 LAB — BASIC METABOLIC PANEL
Anion gap: 7 (ref 5–15)
BUN: 44 mg/dL — ABNORMAL HIGH (ref 8–23)
CO2: 27 mmol/L (ref 22–32)
Calcium: 8.6 mg/dL — ABNORMAL LOW (ref 8.9–10.3)
Chloride: 102 mmol/L (ref 98–111)
Creatinine, Ser: 1.6 mg/dL — ABNORMAL HIGH (ref 0.61–1.24)
GFR, Estimated: 46 mL/min — ABNORMAL LOW (ref >60)
Glucose, Bld: 92 mg/dL (ref 70–99)
Potassium: 4 mmol/L (ref 3.5–5.1)
Sodium: 136 mmol/L (ref 135–145)

## 2020-10-11 LAB — MAGNESIUM: Magnesium: 1.8 mg/dL (ref 1.7–2.4)

## 2020-10-11 LAB — PHOSPHORUS: Phosphorus: 2.2 mg/dL — ABNORMAL LOW (ref 2.5–4.6)

## 2020-10-11 MED ORDER — FLUDROCORTISONE ACETATE 0.1 MG PO TABS
0.2000 mg | ORAL_TABLET | Freq: Every day | ORAL | Status: DC
  Administered 2020-10-11 – 2020-10-16 (×5): 0.2 mg via ORAL
  Filled 2020-10-11 (×7): qty 2

## 2020-10-11 MED FILL — Insulin Regular (Human) Inj 100 Unit/ML: INTRAMUSCULAR | Qty: 0.1 | Status: AC

## 2020-10-11 NOTE — Plan of Care (Signed)
Pt neuro status remains unchanged. Remains on levo, requiring intermittent titration to maintain MAP goal >65 and or SBP >90. Pt with no significant events during shift, goals of care remain the same.  Problem: Education: Goal: Knowledge of General Education information will improve Description: Including pain rating scale, medication(s)/side effects and non-pharmacologic comfort measures Outcome: Progressing   Problem: Health Behavior/Discharge Planning: Goal: Ability to manage health-related needs will improve Outcome: Progressing   Problem: Clinical Measurements: Goal: Ability to maintain clinical measurements within normal limits will improve Outcome: Progressing Goal: Will remain free from infection Outcome: Progressing Goal: Diagnostic test results will improve Outcome: Progressing Goal: Respiratory complications will improve Outcome: Progressing Goal: Cardiovascular complication will be avoided Outcome: Progressing   Problem: Activity: Goal: Risk for activity intolerance will decrease Outcome: Progressing   Problem: Nutrition: Goal: Adequate nutrition will be maintained Outcome: Progressing   Problem: Coping: Goal: Level of anxiety will decrease Outcome: Progressing   Problem: Elimination: Goal: Will not experience complications related to bowel motility Outcome: Progressing Goal: Will not experience complications related to urinary retention Outcome: Progressing   Problem: Pain Managment: Goal: General experience of comfort will improve Outcome: Progressing   Problem: Safety: Goal: Ability to remain free from injury will improve Outcome: Progressing   Problem: Skin Integrity: Goal: Risk for impaired skin integrity will decrease Outcome: Progressing

## 2020-10-11 NOTE — Progress Notes (Signed)
NAME:  Carl [name], MRN:  [id number], DOB:  Aug 03, 1951, LOS: 3 ADMISSION DATE:  10/08/2020, CONSULTATION DATE:  2/14 REFERRING MD:  Jacqualine Code, CHIEF COMPLAINT:  weakness   Brief History:  70 y/o male with extensive past medical history presented to the [facility] ED on 10/09/19 with a chief complaint of generalized weakness and change in mental status.  He was noted to be in shock, bradycardic, hypothermic, profoundly anemic and an acute kidney injury.  Critical care was consulted for further evaluation as he was requiring vasopressors.  The underlying cause of his shock was felt to be hypovolemia from diuretic use, poor p.o. intake, and possibly a slow chronic GI bleed. Past Medical History:  Chronic systolic heart failure, LVEF 20 to 25%, followed by Dr. Algernon Huxley.  Has ICD in place Anxiety Osteoarthritis Chronic kidney disease Cirrhosis of the liver Atrial fibrillation External hemorrhoids GERD History of GI bleeding felt to be possibly related to AVM Status post mitral valve repair Hyperlipidemia Intellectual disability History of coronary artery disease status post MI Peripheral vascular disease Tubular adenoma of the colon Schatzki's ring Peptic ulcer disease Psoriasis Vitiligo   Events:  2/15 admission  10/10/2020- Patient has improved, he is awake and sitting up in bed, weaning off of vasopressors today, continue IVF.  Net 2.5L positive fluid balance, will initiate PT/OT in next 12-24h . Goals of care meeting with family 10/10/2020 10/11/20- patient seems to be more stable today.  He is being medically optimized for dc to SNF or Palliative   Consults:  PCCM Nephrology Cardiology GI  Procedures:  none  Significant Diagnostic Tests:  10/09/2020 US renal >> no acute abnormality, no hydronephrosis  Micro Data:  February 14 SARS-CoV-2 > negative, influenza negative February 14 blood culture >  10/08/2020 MRSA >> negative Antimicrobials:    Objective   Blood pressure  106/67, pulse 74, temperature 99.1 F (37.3 C), temperature source Oral, resp. rate 16, height 5\' 10"  (1.778 m), weight 64.1 kg, SpO2 99 %.        Intake/Output Summary (Last 24 hours) at 10/11/2020 1207 Last data filed at 10/11/2020 0900 Gross per 24 hour  Intake 3137.97 ml  Output 3055 ml  Net 82.97 ml   Filed Weights   10/08/20 1233 10/08/20 1653  Weight: 70 kg 64.1 kg    Examination: General: Adult male age approprate mildly confused sitting up in bed HEENT: MM pink/ dry, anicteric, atraumatic, neck supple Neuro: Awake alert able to respond slowly to verbal communication with confusion CV: s1s2 RRR, SB  on monitor, no r/m/g Pulm: Regular, non labored on room air, breath sounds diminished throughout GI: soft, rounded, non tender (?), bs x 4 GU: foley in place with clear yellow urine Skin: limited exam-no rashes/lesions noted Extremities: warm/dry, pulses + 2 R/P, no edema noted  Resolved Hospital Problem list     Assessment & Plan:  Hypovolemic shock - in the setting of diuresis -hx of chf - cardio on case - appreciate input -hx of chronic slow GI bleeding , anticoagulation has been held  - GI is not planning EGD/C-scope at this time  Junctional bradycardia due to hyperkalemia and hypothermia: Chronic systolic heart failure PMHx: A-fib Received Lokelma x 2. Unable to restart anticoagulation due to chronic GIB  - continuous cardiac monitoring - correct hyperkalemia - cardiology following, do not recommend restarting anticoagulation, palliative consult recommended - hold home coreg, amiodarone   Chronic GI bleed, no evidence of acute GI bleeding: - Monitor for s/s of bleeding -  Daily CBC - Transfuse for Hgb <7 - SCD's for DVT prophylaxis - home anticoagulation on hold - continue home protonix  Acute kidney injury due to hypovolemia: not a candidate for dialysis Lokelma dosed x 2. Patient documented stating he would not want HD. - Strict I/O's: alert provider  if UOP < 0.5 mL/kg/hr - Daily BMP, replace electrolytes PRN - Avoid nephrotoxic agents as able, ensure adequate renal perfusion - Nephrology following, appreciate input - Closely monitor K+, consider veltassa/ additional lokemla doses if recheck K+ remains > 5  Cirrhosis without evidence of ascites - home diuretics on hold (metolazone & torsemide) - trend hepatic function  Acute metabolic encephalopathy due to hypovolemia, shock, anemia - hold home gabapentin - supportive care, including reorientation - minimize sedative medications  Gout: No evidence of flareup - hold allopurinol  Also present on admission Frailty Failure to thrive Physical deconditoining  Best practice (evaluated daily)  Diet: NPO Pain/Anxiety/Delirium protocol (if indicated): n/a VAP protocol (if indicated): n/a DVT prophylaxis: SCD GI prophylaxis: Oral ppi Glucose control: monitor Mobility: bed rest, mobilize as tolerated Disposition: ICU  Goals of Care:  Last date of multidisciplinary goals of care discussion: 2/14 Family and staff present: Lake Bells, daughter Barnett Applebaum Summary of discussion: she feels that he is tired and would not want prolonged aggressive care.  He has never given her clear direction about hemodialysis but when I briefly described it she said that she didn't think he would want it.   Follow up goals of care discussion due: 2/21 Code Status: DNR  Labs   CBC: Recent Labs  Lab 10/08/20 1244 10/08/20 1910 10/09/20 0349 10/10/20 0652 10/11/20 0401  WBC 6.0 7.0 7.6 7.6 7.0  NEUTROABS 4.4  --   --   --   --   HGB 6.6* 10.6* 10.1* 10.7* 9.5*  HCT 21.3* 33.0* 30.6* 32.8* 29.8*  MCV 102.9* 96.8 94.4 94.8 95.8  PLT 356 386 381 386 962    Basic Metabolic Panel: Recent Labs  Lab 10/08/20 1910 10/09/20 0349 10/09/20 1253 10/10/20 0652 10/11/20 0401  NA 124* 126* 125* 130* 136  K 6.2* 5.9* 5.2* 4.3 4.0  CL 91* 89* 89* 95* 102  CO2 22 25 26 27 27   GLUCOSE 119* 98 113* 114* 92   BUN 98* 102* 95* 74* 44*  CREATININE 4.70* 4.46* 4.01* 2.82* 1.60*  CALCIUM 8.7* 8.7* 8.9 8.7* 8.6*  MG  --  2.5*  --  2.1 1.8  PHOS  --  6.4*  --  4.1 2.2*   GFR: Estimated Creatinine Clearance: 39.5 mL/min (A) (by C-G formula based on SCr of 1.6 mg/dL (H)). Recent Labs  Lab 10/08/20 1234 10/08/20 1244 10/08/20 1910 10/09/20 0349 10/10/20 0652 10/11/20 0401  WBC  --    < > 7.0 7.6 7.6 7.0  LATICACIDVEN 1.0  --   --   --   --   --    < > = values in this interval not displayed.    Liver Function Tests: Recent Labs  Lab 10/08/20 1244  AST 27  ALT 19  ALKPHOS 129*  BILITOT 0.8  PROT 5.9*  ALBUMIN 2.7*   No results for input(s): LIPASE, AMYLASE in the last 168 hours. No results for input(s): AMMONIA in the last 168 hours.  ABG    Component Value Date/Time   PHART 7.437 12/27/2018 0957   PCO2ART 35.5 12/27/2018 0957   PO2ART 99.6 12/27/2018 0957   HCO3 23.6 12/27/2018 0957   TCO2 34 (H) 11/09/2019  0924   ACIDBASEDEF 0.1 12/27/2018 0957   O2SAT 97.4 12/27/2018 0957     Coagulation Profile: Recent Labs  Lab 10/08/20 1244  INR 1.6*    Cardiac Enzymes: No results for input(s): CKTOTAL, CKMB, CKMBINDEX, TROPONINI in the last 168 hours.  HbA1C: Hgb A1c MFr Bld  Date/Time Value Ref Range Status  12/27/2018 09:58 AM 5.9 (H) 4.8 - 5.6 % Final    Comment:    (NOTE) Pre diabetes:          5.7%-6.4% Diabetes:              >6.4% Glycemic control for   <7.0% adults with diabetes   06/11/2017 05:40 PM 5.3 4.8 - 5.6 % Final    Comment:    (NOTE) Pre diabetes:          5.7%-6.4% Diabetes:              >6.4% Glycemic control for   <7.0% adults with diabetes     CBG: Recent Labs  Lab 10/08/20 1619 10/08/20 1651 10/10/20 0732  GLUCAP 87 91 111*      Critical care time: 31 minutes     Critical care provider statement:    Critical care time (minutes):  31   Critical care time was exclusive of:  Separately billable procedures and  treating other  patients   Critical care was necessary to treat or prevent imminent or  life-threatening deterioration of the following conditions:  circulatory shock, aki, chronic anemia, chronic gi bleed, multiple comorbid conditions   Critical care was time spent personally by me on the following  activities:  Development of treatment plan with patient or surrogate,  discussions with consultants, evaluation of patient's response to  treatment, examination of patient, obtaining history from patient or  surrogate, ordering and performing treatments and interventions, ordering  and review of laboratory studies and re-evaluation of patient's condition   I assumed direction of critical care for this patient from another  provider in my specialty: no      Ottie Glazier, M.D.  [facility]

## 2020-10-11 NOTE — Progress Notes (Signed)
Progress Note  Patient Name: Carl [name] Date of Encounter: 10/11/2020  PCP:  [name], [place]  Cardiologist: Dr. [name] Advanced Practice Provider:  No care team member to display Electrophysiologist:  None   Subjective   ROS limited by hearing impairment, and as he does not currently have his hearing aids in.  He denies any chest pain or shortness of breath.  Sister currently at bedside.  Inpatient Medications    Scheduled Meds: . amiodarone  200 mg Oral Daily  . atorvastatin  40 mg Oral Daily  . Chlorhexidine Gluconate Cloth  6 each Topical Daily  . fludrocortisone  0.2 mg Oral Daily  . mouth rinse  15 mL Mouth Rinse BID  . midodrine  10 mg Oral TID WC  . pantoprazole  40 mg Oral Daily   Continuous Infusions: . sodium chloride Stopped (10/09/20 0745)  . sodium chloride 100 mL/hr at 10/11/20 0956  . norepinephrine (LEVOPHED) Adult infusion 8 mcg/min (10/11/20 0900)   PRN Meds: docusate sodium, HYDROcodone-acetaminophen, morphine injection, ondansetron (ZOFRAN) IV, polyethylene glycol, traZODone   Vital Signs    Vitals:   10/11/20 1300 10/11/20 1315 10/11/20 1330 10/11/20 1400  BP: (!) 98/59 100/61 (!) 101/53 (!) 98/55  Pulse: 71 69 71 69  Resp:      Temp:      TempSrc:      SpO2: 100% 100% 100% 100%  Weight:      Height:        Intake/Output Summary (Last 24 hours) at 10/11/2020 1641 Last data filed at 10/11/2020 0900 Gross per 24 hour  Intake 2224.2 ml  Output 1990 ml  Net 234.2 ml   Last 3 Weights 10/08/2020 10/08/2020 09/19/2020  Weight (lbs) 141 lb 5 oz 154 lb 5.2 oz 152 lb 3.2 oz  Weight (kg) 64.1 kg 70 kg 69.037 kg      Telemetry    NSR with frequent PVCs- Personally Reviewed  ECG    No new tracings- Personally Reviewed  Physical Exam   GEN:  Thin and frail elderly gentleman.  No hearing aids.  Joined by his sister.  No acute distress.   Neck: No JVD Cardiac: RRR, 2/6 systolic murmur on  exam -appreciated throughout the cardiac exam. No rubs, or gallops.  Cardiac device evident /protruding from left chest given patient low BMI. Respiratory: Clear to auscultation bilaterally. GI: Soft, nontender, non-distended  MS: No edema; No deformity. Neuro:  Nonfocal  Psych: Normal affect   Labs    High Sensitivity Troponin:   Recent Labs  Lab 10/08/20 1244  TROPONINIHS 35*      Cardiac EnzymesNo results for input(s): TROPONINI in the last 168 hours. No results for input(s): TROPIPOC in the last 168 hours.   Chemistry Recent Labs  Lab 10/08/20 1244 10/08/20 1435 10/09/20 1253 10/10/20 0652 10/11/20 0401  NA 122*   < > 125* 130* 136  K 6.0*   < > 5.2* 4.3 4.0  CL 89*   < > 89* 95* 102  CO2 21*   < > 26 27 27   GLUCOSE 90   < > 113* 114* 92  BUN 106*   < > 95* 74* 44*  CREATININE 4.90*   < > 4.01* 2.82* 1.60*  CALCIUM 8.1*   < > 8.9 8.7* 8.6*  PROT 5.9*  --   --   --   --   ALBUMIN 2.7*  --   --   --   --  AST 27  --   --   --   --   ALT 19  --   --   --   --   ALKPHOS 129*  --   --   --   --   BILITOT 0.8  --   --   --   --   GFRNONAA 12*   < > 15* 23* 46*  ANIONGAP 12   < > 10 8 7    < > = values in this interval not displayed.     Hematology Recent Labs  Lab 10/09/20 0349 10/10/20 0652 10/11/20 0401  WBC 7.6 7.6 7.0  RBC 3.24* 3.46* 3.11*  HGB 10.1* 10.7* 9.5*  HCT 30.6* 32.8* 29.8*  MCV 94.4 94.8 95.8  MCH 31.2 30.9 30.5  MCHC 33.0 32.6 31.9  RDW 19.3* 19.1* 18.9*  PLT 381 386 376    BNP Recent Labs  Lab 10/08/20 1245  BNP 564.4*     DDimer No results for input(s): DDIMER in the last 168 hours.   Radiology    DG Chest Port 1 View  Result Date: 10/11/2020 CLINICAL DATA:  Evaluate PICC line insertion. EXAM: PORTABLE CHEST 1 VIEW COMPARISON:  10/10/2020 FINDINGS: Left chest wall pacer device is noted with lead in the right ventricle. The right arm PICC line tip is in the cavoatrial junction. Stable mild cardiac enlargement. Left pleural  effusion is again seen. There is diffuse interstitial and airspace densities within both lungs which appear unchanged. Remote right rib fractures are again identified. IMPRESSION: 1. Right arm PICC line tip is at the cavoatrial junction. 2. Stable left pleural effusion and diffuse interstitial and airspace densities within both lungs. Electronically Signed   By: Kerby Moors M.D.   On: 10/11/2020 15:08   DG Chest Port 1 View  Result Date: 10/10/2020 CLINICAL DATA:  Hypotension, edema EXAM: PORTABLE CHEST 1 VIEW COMPARISON:  10/08/2020 FINDINGS: Cardiomegaly, vascular congestion. Diffuse interstitial prominence throughout the lungs, favor edema. No real change since prior study. Left AICD remains in place, unchanged. Prior median sternotomy/CABG. No effusions or pneumothorax. Old right rib fractures again noted, unchanged. IMPRESSION: Cardiomegaly, vascular congestion and mild pulmonary edema. No real change. Electronically Signed   By: Rolm Baptise M.D.   On: 10/10/2020 12:19    Cardiac Studies   TEE (11/09/2019): 1. Left ventricular ejection fraction, by estimation, is 20 to 25%. The  left ventricle has severely decreased function. The left ventricle  demonstrates global hypokinesis. The left ventricular internal cavity size  was moderately dilated.  2. Right ventricular systolic function is moderately reduced. The right  ventricular size is mildly enlarged. There is mildly elevated pulmonary  artery systolic pressure.  3. There was an Amplatzer atrial septal closure device present, no  shunting noted by color doppler. Left atrial size was moderately dilated.  No left atrial/left atrial appendage thrombus was detected.  4. Right atrial size was moderately dilated.  5. Peak RV-RA gradient 24 mmHg. Tricuspid valve regurgitation is moderate  to severe.  6. The aortic valve is tricuspid. Aortic valve regurgitation is trivial.  No aortic stenosis is present.  7. The mitral valve was s/p  repair with 2 Mitraclip devices placed in the  A2-P2 area. There was moderate residual MR with PISA ERO 0.25 cm^2. Mean  gradient 3 mmHg across the mitral valve, no significant stenosis. No  pulmonary vein systolic flow reversal  noted.  8. Normal caliber thoracic aorta with mild plaque.  R/LHC 11/2018: 1. Severe  native vessel CAD with total occlusion of the RCA, LAD, and left circumflex and severe distal left main stenosis 2. Status post multivessel CABG with continued patency of the LIMA to LAD, saphenous vein graft to first diagonal, sequential saphenous vein graft to OM 2 and OM 3, and saphenous vein graft to PDA 3. Preserved cardiac output 4. Large V waves consistent with severe mitral regurgitation  Patient Profile     70 y.o. male with history of CAD/CABG x5, severe MR s/p mitral clip, chronic HFrEF due to ischemic cardiomyopathy, PAF, cirrhosis, CKD, chronic AVM with history of GI bleed, and evaluated today for chronic ischemic cardiomyopathy/PAF in the setting of presentation with acute GI bleed/hemoglobin 6.6.   Assessment & Plan    Chronic HFrEF, ICM --No shortness of breath.Most recent echo as above with EF 20 to 25%.  Known history of CAD with 11/2018 cath as above showing severe native vessel CAD with total occlusion of the RCA, LAD, left circumflex, and severe distal left main stenosis.  He is s/p multivessel CABG with continued patency of the LIMA to LAD, SVG to first diagonal, sequential SVG to OM 2 and OM 3, and SVG to PDA.  He has known history of severe mitral regurgitation s/p mitral clip.  Presented with AKI and hypovolemia, improved on IV fluids. --Appears euvolemic and well compensated on exam. --Not currently on a diuretic / IV diuresis given hypovolemia and AKI at presentation. PTA diuretic held. --As previously noted, recommend net even to slightly positive fluid status given severe AKI at presentation.   --Monitor volume status closely with IV fluids, given  reduced EF.  Given improvement of renal function, could consider decreasing IV fluids at this time. --Given reduced oral intake leading up to his admission, would not restart home metolazone/ torsemide at this time.  Close outpatient follow-up with repeat BMET if restarted on any outpatient diuretics at a later date, given his reduced oral intake. --Beta-blocker held due to shock and junctional bradycardia as below. --Prognosis guarded.  Consider palliative care involvement.  Shock --Suspected multifactorial, driven primarily by hypovolemia.  Continue norepinephrine as needed for pressor support per CCM with preference to wean as tolerated with midodrine.  Continue midodrine for BP support.  As above, recommend maintain even to slightly positive fluid balance.  Severe anemia --Likely 2/2 GI bleed.  GI has deferred endoscopy.  Supportive care recommended.  Continue to monitor H&H with daily CBC.  Post recent H&H stable from previous labs.  Consider transfusion for hemoglobin less than 8.  Continue to hold Eliquis, given he has now had a total of 2 GI bleeds.  PAF s/p cardioversion --NSR with frequent PVCs.  Continue to monitor electrolytes.  Continue amiodarone. BB held given recent bradycardia on telemetry as below and shock on pressors. Given his severe anemia, recommend continuing to hold Eliquis and avoid any antiplatelet or anticoagulation therapy.  Severe MR s/p mitral clip --Moderate MR by TEE. Continue to monitor with periodic echo. No intervention this admission.  H/o junctional bradycardia --Seen on previous telemetry review and now resolved.  Junctional bradycardia thought 2/2 anemia and hypothermia with electrolyte abnormalities.  For questions or updates, please contact [place] Please consult [email or web address] for contact info under        Signed, Arvil Chaco, PA-C  10/11/2020, 4:41 PM

## 2020-10-11 NOTE — Progress Notes (Signed)
[facility]  ROUNDING NOTE   Subjective:   UOP 394mL Creatinine 1.6 (2.82) (4.01) Na 136 (130) (125) K 4 (4.3) (5.2)  NS at 170mL/hr Norepinephrine gtt  Sleeping this morning.    Objective:  Vital signs in last 24 hours:  Temp:  [98 F (36.7 C)-99.1 F (37.3 C)] 99.1 F (37.3 C) (02/17 0400) Pulse Rate:  [54-89] 74 (02/17 1115) Resp:  [11-25] 16 (02/17 1115) BP: (78-116)/(41-70) 106/67 (02/17 1115) SpO2:  [92 %-100 %] 99 % (02/17 1115)  Weight change:  Filed Weights   10/08/20 1233 10/08/20 1653  Weight: 70 kg 64.1 kg    Intake/Output: I/O last 3 completed shifts: In: 5637.4 [P.O.:930; I.V.:4707.4] Out: 6670 [Urine:6670]   Intake/Output this shift:  Total I/O In: 247 [I.V.:247] Out: -   Physical Exam: General: Ill appearing  Head: Moist oral mucosal membranes  Eyes: +hard of hearing  Neck: Supple, trachea midline  Lungs:  Clear to auscultation  Heart: Regular rate and rhythm  Abdomen:  Soft, nontender,   Extremities: no peripheral edema.  Neurologic: confused  Skin: No lesions  Access: none    Basic Metabolic Panel: Recent Labs  Lab 10/08/20 1910 10/09/20 0349 10/09/20 1253 10/10/20 0652 10/11/20 0401  NA 124* 126* 125* 130* 136  K 6.2* 5.9* 5.2* 4.3 4.0  CL 91* 89* 89* 95* 102  CO2 22 25 26 27 27   GLUCOSE 119* 98 113* 114* 92  BUN 98* 102* 95* 74* 44*  CREATININE 4.70* 4.46* 4.01* 2.82* 1.60*  CALCIUM 8.7* 8.7* 8.9 8.7* 8.6*  MG  --  2.5*  --  2.1 1.8  PHOS  --  6.4*  --  4.1 2.2*    Liver Function Tests: Recent Labs  Lab 10/08/20 1244  AST 27  ALT 19  ALKPHOS 129*  BILITOT 0.8  PROT 5.9*  ALBUMIN 2.7*   No results for input(s): LIPASE, AMYLASE in the last 168 hours. No results for input(s): AMMONIA in the last 168 hours.  CBC: Recent Labs  Lab 10/08/20 1244 10/08/20 1910 10/09/20 0349 10/10/20 0652 10/11/20 0401  WBC 6.0 7.0 7.6 7.6 7.0  NEUTROABS 4.4  --   --   --   --   HGB 6.6* 10.6* 10.1* 10.7*  9.5*  HCT 21.3* 33.0* 30.6* 32.8* 29.8*  MCV 102.9* 96.8 94.4 94.8 95.8  PLT 356 386 381 386 376    Cardiac Enzymes: No results for input(s): CKTOTAL, CKMB, CKMBINDEX, TROPONINI in the last 168 hours.  BNP: Invalid input(s): POCBNP  CBG: Recent Labs  Lab 10/08/20 1619 10/08/20 1651 10/10/20 0732  GLUCAP 87 91 111*    Microbiology: Results for orders placed or performed during the hospital encounter of 10/08/20  Blood Culture (routine x 2)     Status: None (Preliminary result)   Collection Time: 10/08/20 12:35 PM   Specimen: BLOOD  Result Value Ref Range Status   Specimen Description BLOOD LEFT ANTECUBITAL  Final   Special Requests   Final    BOTTLES DRAWN AEROBIC AND ANAEROBIC Blood Culture adequate volume   Culture   Final    NO GROWTH 3 DAYS Performed at [facility], [address] [place],  [address]    Report Status PENDING  Incomplete  Blood Culture (routine x 2)     Status: None (Preliminary result)   Collection Time: 10/08/20 12:35 PM   Specimen: BLOOD  Result Value Ref Range Status   Specimen Description BLOOD RIGHT ANTECUBITAL  Final  Special Requests   Final    BOTTLES DRAWN AEROBIC AND ANAEROBIC Blood Culture adequate volume   Culture   Final    NO GROWTH 3 DAYS Performed at [facility], [address] [place], [place] [address]    Report Status PENDING  Incomplete  Resp Panel by RT-PCR (Flu A&B, Covid) Nasopharyngeal Swab     Status: None   Collection Time: 10/08/20 12:44 PM   Specimen: Nasopharyngeal Swab; Nasopharyngeal(NP) swabs in vial transport medium  Result Value Ref Range Status   SARS Coronavirus 2 by RT PCR NEGATIVE NEGATIVE Final    Comment: (NOTE) SARS-CoV-2 target nucleic acids are NOT DETECTED.  The SARS-CoV-2 RNA is generally detectable in upper respiratory specimens during the acute phase of infection. The lowest concentration of SARS-CoV-2 viral copies this assay can detect is 138 copies/mL. A  negative result does not preclude SARS-Cov-2 infection and should not be used as the sole basis for treatment or other patient management decisions. A negative result may occur with  improper specimen collection/handling, submission of specimen other than nasopharyngeal swab, presence of viral mutation(s) within the areas targeted by this assay, and inadequate number of viral copies(<138 copies/mL). A negative result must be combined with clinical observations, patient history, and epidemiological information. The expected result is Negative.  Fact Sheet for Patients:  EntrepreneurPulse.com.au  Fact Sheet for Healthcare Providers:  IncredibleEmployment.be  This test is no t yet approved or cleared by the [place] FDA and  has been authorized for detection and/or diagnosis of SARS-CoV-2 by FDA under an Emergency Use Authorization (EUA). This EUA will remain  in effect (meaning this test can be used) for the duration of the COVID-19 declaration under Section 564(b)(1) of the Act, 21 U.S.C.section 360bbb-3(b)(1), unless the authorization is terminated  or revoked sooner.       Influenza A by PCR NEGATIVE NEGATIVE Final   Influenza B by PCR NEGATIVE NEGATIVE Final    Comment: (NOTE) The Xpert Xpress SARS-CoV-2/FLU/RSV plus assay is intended as an aid in the diagnosis of influenza from Nasopharyngeal swab specimens and should not be used as a sole basis for treatment. Nasal washings and aspirates are unacceptable for Xpert Xpress SARS-CoV-2/FLU/RSV testing.  Fact Sheet for Patients: EntrepreneurPulse.com.au  Fact Sheet for Healthcare Providers: IncredibleEmployment.be  This test is not yet approved or cleared by the [place] FDA and has been authorized for detection and/or diagnosis of SARS-CoV-2 by FDA under an Emergency Use Authorization (EUA). This EUA will remain in effect (meaning this test can  be used) for the duration of the COVID-19 declaration under Section 564(b)(1) of the Act, 21 U.S.C. section 360bbb-3(b)(1), unless the authorization is terminated or revoked.  Performed at [facility], [address] [place], [place] [address]   MRSA PCR Screening     Status: None   Collection Time: 10/08/20  4:53 PM   Specimen: Nasopharyngeal  Result Value Ref Range Status   MRSA by PCR NEGATIVE NEGATIVE Final    Comment:        The GeneXpert MRSA Assay (FDA approved for NASAL specimens only), is one component of a comprehensive MRSA colonization surveillance program. It is not intended to diagnose MRSA infection nor to guide or monitor treatment for MRSA infections. Performed at [facility], [address] [place], [place] [address]    *Note: Due to a large number of results and/or encounters for the requested time period, some results have not been displayed. A complete set of results  can be found in Results Review.    Coagulation Studies: Recent Labs    10/08/20 1244  LABPROT 18.5*  INR 1.6*    Urinalysis: No results for input(s): COLORURINE, LABSPEC, PHURINE, GLUCOSEU, HGBUR, BILIRUBINUR, KETONESUR, PROTEINUR, UROBILINOGEN, NITRITE, LEUKOCYTESUR in the last 72 hours.  Invalid input(s): APPERANCEUR    Imaging: DG Chest Port 1 View  Result Date: 10/10/2020 CLINICAL DATA:  Hypotension, edema EXAM: PORTABLE CHEST 1 VIEW COMPARISON:  10/08/2020 FINDINGS: Cardiomegaly, vascular congestion. Diffuse interstitial prominence throughout the lungs, favor edema. No real change since prior study. Left AICD remains in place, unchanged. Prior median sternotomy/CABG. No effusions or pneumothorax. Old right rib fractures again noted, unchanged. IMPRESSION: Cardiomegaly, vascular congestion and mild pulmonary edema. No real change. Electronically Signed   By: Rolm Baptise M.D.   On: 10/10/2020 12:19     Medications:   . sodium chloride Stopped (10/09/20  0745)  . sodium chloride 100 mL/hr at 10/11/20 0956  . norepinephrine (LEVOPHED) Adult infusion 8 mcg/min (10/11/20 0900)   . amiodarone  200 mg Oral Daily  . atorvastatin  40 mg Oral Daily  . Chlorhexidine Gluconate Cloth  6 each Topical Daily  . fludrocortisone  0.2 mg Oral Daily  . mouth rinse  15 mL Mouth Rinse BID  . midodrine  10 mg Oral TID WC  . pantoprazole  40 mg Oral Daily   docusate sodium, HYDROcodone-acetaminophen, morphine injection, ondansetron (ZOFRAN) IV, polyethylene glycol, traZODone  Assessment/ Plan:  Mr. Carl [name] is a 70 y.o. white male with coronary artery disease status post mitral valve repair, hepatic cirrhosis, congestive heart failure, AICD placement, hypertension, hyperlipidemia, peripheral vascular disease, peptic ulcer disease, hearing loss, who was admitted to [facility] on 10/08/2020 for Hypovolemic shock ([place]) [R57.1] Hyperkalemia [E87.5] Bradycardia [R00.1] Shock circulatory ([place]) [R57.9] Acute GI bleeding [K92.2] Dehydration, severe [E86.0] AKI (acute kidney injury) ([place]) [N17.9]  1. Acute kidney injury: with hyperkalemia: on chronic kidney disease stage IIIB. Baseline creatinine of 2.2 with GFR of 32 on 07/03/20. History of bland urine.  Renal ultrasound reviewed. - hold potassium chloride. Low potassium diet - Continue foley catheter - Continue IV fluids and encourage PO intake - no acute indication for dialysis. Patient is documented as stating he was not interested in dialysis.    2. Hyponatremia: acute on chronic: baseline 130-135. Better than baseline home regimen includes metolazone - IV normal saline - Holding metolazone. Do not recommend restarting metolazone on discharge.   3. Anemia with acute kidney failure: Multiple PRBC transfusions. Hemoglobin stable currently    LOS: 3 Latara Micheli 2/17/202212:06 PM

## 2020-10-11 NOTE — TOC Progression Note (Signed)
Transition of Care [facility]) - Progression Note    Patient Details  Name: Carl [name] MRN: [id number] Date of Birth: 1950-09-12  Transition of [facility] CM/SW [place], [place] Phone Number: [phone] 10/11/2020, 11:53 AM  Clinical Narrative:    CSW spoke with patient's Tye, [place] (Brother) [phone] with an update on patient status.  CSW let Mr. Pam know PT/OT assessed the patient yesterday and they both recommended SN placement.  Mr. Maslowski stated their preferred SNF facilities are [facility] and [facility].  CSW explained the SNF placement process and possible timeline and reiterated that CSW would not be able to guarantee preferred placement. Mr. Nunziata verbalized understanding.         Expected Discharge Plan and Services                                                 Social Determinants of Health (SDOH) Interventions    Readmission Risk Interventions Readmission Risk Prevention Plan 03/05/2019 12/31/2018  Transportation Screening Complete Complete  PCP or Specialist Appt within 3-5 Days Complete Complete  HRI or [place] Complete Complete  Social Work Consult for [place] Planning/Counseling Complete Complete  Palliative Care Screening Complete Not Applicable  Medication Review Press photographer) Complete Complete  Some recent data might be hidden

## 2020-10-11 NOTE — Procedures (Signed)
POWER PICC 5.0 F PROCEDURE NOTE-TRIPLE LUMEN    Indication: Patient receiving vesicant or irritant drug.; Patient receiving intravenous therapy for longer than 5 days.; Patient has limited or no vascular access.   Consent: FROM PATIENT AND POA     Hand washing performed prior to starting the procedure.   Procedure:   An active timeout was performed and correct patient, name, & ID confirmed.   Patient was positioned correctly for central venous access.  Patient was prepped using strict sterile technique including chlorohexadine preps, sterile drape, sterile gown and sterile gloves.    The area was prepped, draped and anesthetized in the usual sterile manner. Patient comfort was obtained.    A triple lumen catheter was placed in right cephalic vein.  There was good blood return, catheter caps were placed on lumens, catheter flushed easily, the line was secured and a sterile dressing and BIO-PATCH applied.   Ultrasound was used to visualize vasculature and guidance of needle.   Number of Attempts: 1 Complications:none Estimated Blood Loss: none Chest Radiograph indicated and ordered.     Carl [name], M.D.  Pulmonary & [place]

## 2020-10-12 DIAGNOSIS — I5020 Unspecified systolic (congestive) heart failure: Secondary | ICD-10-CM | POA: Diagnosis not present

## 2020-10-12 DIAGNOSIS — I480 Paroxysmal atrial fibrillation: Secondary | ICD-10-CM | POA: Diagnosis not present

## 2020-10-12 DIAGNOSIS — R579 Shock, unspecified: Secondary | ICD-10-CM | POA: Diagnosis not present

## 2020-10-12 LAB — CBC
HCT: 25.9 % — ABNORMAL LOW (ref 39.0–52.0)
Hemoglobin: 8 g/dL — ABNORMAL LOW (ref 13.0–17.0)
MCH: 30.8 pg (ref 26.0–34.0)
MCHC: 30.9 g/dL (ref 30.0–36.0)
MCV: 99.6 fL (ref 80.0–100.0)
Platelets: 297 10*3/uL (ref 150–400)
RBC: 2.6 MIL/uL — ABNORMAL LOW (ref 4.22–5.81)
RDW: 18.6 % — ABNORMAL HIGH (ref 11.5–15.5)
WBC: 7.3 10*3/uL (ref 4.0–10.5)
nRBC: 0 % (ref 0.0–0.2)

## 2020-10-12 LAB — BASIC METABOLIC PANEL
Anion gap: 6 (ref 5–15)
BUN: 26 mg/dL — ABNORMAL HIGH (ref 8–23)
CO2: 22 mmol/L (ref 22–32)
Calcium: 7.5 mg/dL — ABNORMAL LOW (ref 8.9–10.3)
Chloride: 105 mmol/L (ref 98–111)
Creatinine, Ser: 1.2 mg/dL (ref 0.61–1.24)
GFR, Estimated: >60 mL/min (ref >60)
Glucose, Bld: 230 mg/dL — ABNORMAL HIGH (ref 70–99)
Potassium: 3.2 mmol/L — ABNORMAL LOW (ref 3.5–5.1)
Sodium: 133 mmol/L — ABNORMAL LOW (ref 135–145)

## 2020-10-12 LAB — PHOSPHORUS
Phosphorus: 1.6 mg/dL — ABNORMAL LOW (ref 2.5–4.6)
Phosphorus: 3.6 mg/dL (ref 2.5–4.6)

## 2020-10-12 LAB — MAGNESIUM: Magnesium: 1.6 mg/dL — ABNORMAL LOW (ref 1.7–2.4)

## 2020-10-12 MED ORDER — MAGNESIUM SULFATE 4 GM/100ML IV SOLN
4.0000 g | Freq: Once | INTRAVENOUS | Status: AC
  Administered 2020-10-12: 4 g via INTRAVENOUS
  Filled 2020-10-12: qty 100

## 2020-10-12 MED ORDER — POTASSIUM PHOSPHATES 15 MMOLE/5ML IV SOLN
45.0000 mmol | Freq: Once | INTRAVENOUS | Status: AC
  Administered 2020-10-12: 45 mmol via INTRAVENOUS
  Filled 2020-10-12: qty 15

## 2020-10-12 MED ORDER — NOREPINEPHRINE 4 MG/250ML-% IV SOLN
0.0000 ug/min | INTRAVENOUS | Status: DC
  Administered 2020-10-12: 6 ug/min via INTRAVENOUS

## 2020-10-12 NOTE — NC FL2 (Signed)
[place] LEVEL OF CARE SCREENING TOOL     IDENTIFICATION  Patient Name: Carl [name] Birthdate: 1951-08-18 Sex: male Admission Date (Current Location): 10/08/2020  [place] and [place] Number:  Carl [name] [id number] [place] and Address:  [facility], [address] [place], [place] [address]      Provider Number: [id number]  Attending Physician Name and Address:  Tyler Pita, MD  Relative Name and Phone Number:  Daunte, Oest[place] [facility]   [phone]    Current Level of Care: Hospital Recommended Level of Care: [place] Prior Approval Number:    Date Approved/Denied:   PASRR Number:    Discharge Plan: SNF    Current Diagnoses: Patient Active Problem List   Diagnosis Date Noted  . HFrEF (heart failure with reduced ejection fraction) ([place])   . Paroxysmal atrial fibrillation (HCC)   . Shock circulatory ([place]) 10/08/2020  . Acute kidney injury superimposed on CKD ([place]) 08/03/2020  . Leukocytosis 08/03/2020  . Acute on chronic blood loss anemia 08/02/2020  . Pressure injury of skin 06/29/2020  . Hyponatremia 06/27/2020  . Acute GI bleeding 06/27/2020  . 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 Persistent atrial fibrillation ([place]) 11/30/2019  . Secondary hypercoagulable state ([place]) 11/30/2019  . Arm DVT (deep venous thromboembolism), acute, left ([place]) 11/26/2019  . Hx of adenomatous colonic polyps 05/18/2019  . Cirrhosis ([place]) 05/12/2019  . Occult blood positive stool 05/12/2019  . HCAP (healthcare-associated pneumonia) 03/04/2019  . Hallucinations 02/10/2019  . Elevated blood sugar 01/27/2019  . S/P mitral valve repair 12/31/2018  . Bilateral pulmonary contusion 10/17/2018  . Cardiac LV ejection fraction of 20-34% 10/17/2018  . Chronic systolic CHF (congestive heart failure) ([place]) 10/17/2018  . History of ischemic cardiomyopathy 10/17/2018  . ICD (implantable cardioverter-defibrillator) in place  10/17/2018  . S/p left hip fracture 10/17/2018  . Traumatic retroperitoneal hematoma 10/17/2018  . Age-related osteoporosis with current pathological fracture with routine healing 07/28/2018  . CAD (coronary artery disease) 07/21/2018  . GERD (gastroesophageal reflux disease) 07/21/2018  . Depression 07/21/2018  . CKD (chronic kidney disease), stage III ([place]) 07/21/2018  . Iron deficiency anemia 07/21/2018  . Hypokalemia 07/21/2018  . Closed left hip fracture ([place]) 07/21/2018  . Fracture of femoral neck, left ([place]) 07/21/2018  . Lower extremity pain, bilateral 11/12/2017  . 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 Hyperlipidemia 09/01/2017  . Essential hypertension 09/01/2017  . PAD (peripheral artery disease) ([place]) 09/01/2017  . Symptomatic anemia 08/06/2017  . GAD (generalized anxiety disorder) 06/18/2017  . Prostate cancer screening 04/28/2017  . Hydrocele 04/28/2017  . Bradycardia   . Premature ventricular contraction   . Severe mitral insufficiency   . Cardiomyopathy, ischemic 04/26/2015  . Difficulty hearing 12/29/2014  . Acute on chronic systolic CHF (congestive heart failure) ([place]) 10/10/2014  . 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 Protein-calorie malnutrition, severe ([place]) 07/29/2014  . AKI (acute kidney injury) ([place]) 07/29/2014    Orientation RESPIRATION BLADDER Height & Weight     Self,Situation,Place  Normal Continent Weight: 141 lb 5 oz (64.1 kg) Height:  5\' 10"  (177.8 cm)  BEHAVIORAL SYMPTOMS/MOOD NEUROLOGICAL BOWEL NUTRITION STATUS      Continent Diet  AMBULATORY STATUS COMMUNICATION OF NEEDS Skin   Limited Assist   Normal                       Personal Care Assistance Level of Assistance  Bathing,Feeding,Dressing,Total care Bathing Assistance: Limited assistance Feeding assistance: Limited  assistance Dressing Assistance: Limited assistance Total Care Assistance: Limited assistance   Functional Limitations Info  Sight,Hearing,Speech  Sight Info: Adequate Hearing Info: Adequate Speech Info: Adequate    SPECIAL CARE FACTORS FREQUENCY       PT 5X per week     OT 5X per week            Contractures Contractures Info: Not present    Additional Factors Info                  Current Medications (10/12/2020):  This is the current hospital active medication list Current Facility-Administered Medications  Medication Dose Route Frequency Provider Last Rate Last Admin  . 0.9 %  sodium chloride infusion  250 mL Intravenous Continuous Juanito Doom, MD   Stopped at 10/09/20 0745  . amiodarone (PACERONE) tablet 200 mg  200 mg Oral Daily Simonne Maffucci B, MD   200 mg at 10/11/20 1041  . atorvastatin (LIPITOR) tablet 40 mg  40 mg Oral Daily Simonne Maffucci B, MD   40 mg at 10/11/20 1041  . Chlorhexidine Gluconate Cloth 2 % PADS 6 each  6 each Topical Daily Juanito Doom, MD   6 each at 10/11/20 0957  . docusate sodium (COLACE) capsule 100 mg  100 mg Oral BID PRN Simonne Maffucci B, MD   100 mg at 10/08/20 1742  . fludrocortisone (FLORINEF) tablet 0.2 mg  0.2 mg Oral Daily Ottie Glazier, MD   0.2 mg at 10/11/20 1220  . HYDROcodone-acetaminophen (NORCO/VICODIN) 5-325 MG per tablet 1-2 tablet  1-2 tablet Oral Q6H PRN Bradly Bienenstock, NP   2 tablet at 10/08/20 2211  . MEDLINE mouth rinse  15 mL Mouth Rinse BID Simonne Maffucci B, MD   15 mL at 10/11/20 1041  . midodrine (PROAMATINE) tablet 10 mg  10 mg Oral TID WC Rust-Chester, Britton L, NP   10 mg at 10/12/20 0569  . morphine 2 MG/ML injection 1-2 mg  1-2 mg Intravenous Q4H PRN Darel Hong D, NP      . norepinephrine (LEVOPHED) 4mg  in 276mL premix infusion  0-40 mcg/min Intravenous Titrated Ottie Glazier, MD 22.5 mL/hr at 10/12/20 0555 6 mcg/min at 10/12/20 0555  . ondansetron (ZOFRAN) injection 4 mg  4 mg Intravenous Q6H PRN Simonne Maffucci B, MD      . pantoprazole (PROTONIX) EC tablet 40 mg  40 mg Oral Daily Simonne Maffucci B, MD   40 mg at  10/11/20 1041  . polyethylene glycol (MIRALAX / GLYCOLAX) packet 17 g  17 g Oral Daily PRN Simonne Maffucci B, MD      . potassium PHOSPHATE 45 mmol in dextrose 5 % 500 mL infusion  45 mmol Intravenous Once Awilda Bill, NP 86 mL/hr at 10/12/20 0600 45 mmol at 10/12/20 0600  . traZODone (DESYREL) tablet 50 mg  50 mg Oral QHS PRN Bradly Bienenstock, NP   50 mg at 10/10/20 2259     Discharge Medications: Please see discharge summary for a list of discharge medications.  Relevant Imaging Results:  Relevant Lab Results:   Additional Information SS# [id number]  Adelene Amas, LCSWA

## 2020-10-12 NOTE — Progress Notes (Signed)
NAME:  ANDREZ LIEURANCE, MRN:  [id number], DOB:  1951-01-17, LOS: 4 ADMISSION DATE:  10/08/2020, INITIAL CONSULTATION DATE:  2/14 REFERRING MD:  Jacqualine Code, CHIEF COMPLAINT:  weakness   Brief History:  70 y/o male with extensive past medical history presented to the [facility] ED on 10/09/19 with a chief complaint of generalized weakness and change in mental status.  He was noted to be in shock, bradycardic, hypothermic, profoundly anemic and an acute kidney injury.  Critical care was consulted for further evaluation as he was requiring vasopressors.  The underlying cause of his shock was felt to be hypovolemia from diuretic use, poor p.o. intake, and possibly a slow chronic GI bleed. Past Medical History:  Chronic systolic heart failure, LVEF 20 to 25%, followed by Dr. [name].  Has ICD in place Anxiety Osteoarthritis Chronic kidney disease Cirrhosis of the liver Atrial fibrillation External hemorrhoids GERD History of GI bleeding felt to be possibly related to AVM Status post mitral valve repair Hyperlipidemia Intellectual disability History of coronary artery disease status post MI Peripheral vascular disease Tubular adenoma of the colon Schatzki's ring Peptic ulcer disease Psoriasis Vitiligo   Events:  2/15 admission  10/10/2020- Patient has improved, he is awake and sitting up in bed, weaning off of vasopressors today, continue IVF.  Net 2.5L positive fluid balance, will initiate PT/OT in next 12-24h . Goals of care meeting with family 10/10/2020 10/11/20- patient seems to be more stable today.  He is being medically optimized for dc to SNF or Palliative 10/12/2020-weaning norepinephrine off,  Consults:  PCCM Nephrology Cardiology GI  Procedures:  none  Significant Diagnostic Tests:  10/09/2020 US renal >> no acute abnormality, no hydronephrosis  Micro Data:  February 14 SARS-CoV-2 > negative, influenza negative February 14 blood culture >  10/08/2020 MRSA >>  negative Antimicrobials:    Objective   Blood pressure 109/70, pulse 65, temperature 98.7 F (37.1 C), temperature source Oral, resp. rate (!) 21, height 5\' 10"  (1.778 m), weight 64.1 kg, SpO2 98 %.        Intake/Output Summary (Last 24 hours) at 10/12/2020 1159 Last data filed at 10/12/2020 0900 Gross per 24 hour  Intake 3590.83 ml  Output 1750 ml  Net 1840.83 ml   Filed Weights   10/08/20 1233 10/08/20 1653  Weight: 70 kg 64.1 kg    Examination: General: Adult male age approprate mildly confused participating with PT  HEENT: MM pink/ dry, anicteric, atraumatic, neck supple Neuro: No overt focal deficit, mildly befuddled CV: s1s2 RRR, SB  on monitor, no r/m/g Pulm: Regular, non labored on room air, breath sounds diminished throughout GI: soft, rounded, non tender (?), bs x 4 GU: foley in place with clear yellow urine Skin: limited exam-no rashes/lesions noted Extremities: warm/dry, pulses + 2 R/P, no edema noted  Resolved Hospital Problem list     Assessment & Plan:  Hypovolemic shock - in the setting of diuresis -hx of chf - cardio on case - appreciate input -hx of chronic slow GI bleeding , anticoagulation has been held  - GI is not planning EGD/C-scope at this time  Junctional bradycardia due to hyperkalemia and hypothermia: Chronic systolic heart failure PMHx: A-fib Received Lokelma x 2. Unable to restart anticoagulation due to chronic GIB  - continuous cardiac monitoring - correct hyperkalemia - cardiology following, do not recommend restarting anticoagulation - palliative consult placed - hold home coreg, amiodarone  Chronic GI bleed, no evidence of acute GI bleeding: - Monitor for s/s of bleeding -  Daily CBC - Transfuse for Hgb <7 - SCD's for DVT prophylaxis - home anticoagulation on hold - continue home protonix  Acute kidney injury due to hypovolemia: not a candidate for dialysis Lokelma dosed x 2. Patient documented stating he would not want  HD. - Strict I/O's: alert provider if UOP < 0.5 mL/kg/hr - Daily BMP, replace electrolytes PRN - Avoid nephrotoxic agents as able, ensure adequate renal perfusion - Nephrology following, appreciate input - Closely monitor K+, will need potassium supplementation no further Lokelma  Cirrhosis without evidence of ascites - home diuretics on hold (metolazone & torsemide) - trend hepatic function  Acute metabolic encephalopathy due to hypovolemia, shock, anemia - hold home gabapentin - supportive care, including reorientation - minimize sedative medications - PT/OT should help reorient patient  Gout: No evidence of flareup - hold allopurinol  Also present on admission Frailty Failure to thrive Physical deconditoining  Best practice (evaluated daily)  Diet: NPO Pain/Anxiety/Delirium protocol (if indicated): n/a VAP protocol (if indicated): n/a DVT prophylaxis: SCD GI prophylaxis: Oral ppi Glucose control: monitor Mobility: bed rest, mobilize as tolerated Disposition: Stepdown  Goals of Care:  Last date of multidisciplinary goals of care discussion: 2/14 Family and staff present: Lake Bells, daughter Barnett Applebaum Summary of discussion: she feels that he is tired and would not want prolonged aggressive care.  He has never given her clear direction about hemodialysis but when I briefly described it she said that she didn't think he would want it.   Follow up goals of care discussion due: 2/21, palliative care consultation placed Code Status: DNR  Labs   CBC: Recent Labs  Lab 10/08/20 1244 10/08/20 1910 10/09/20 0349 10/10/20 0652 10/11/20 0401 10/12/20 0357  WBC 6.0 7.0 7.6 7.6 7.0 7.3  NEUTROABS 4.4  --   --   --   --   --   HGB 6.6* 10.6* 10.1* 10.7* 9.5* 8.0*  HCT 21.3* 33.0* 30.6* 32.8* 29.8* 25.9*  MCV 102.9* 96.8 94.4 94.8 95.8 99.6  PLT 356 386 381 386 376 132    Basic Metabolic Panel: Recent Labs  Lab 10/09/20 0349 10/09/20 1253 10/10/20 0652 10/11/20 0401  10/12/20 0357  NA 126* 125* 130* 136 133*  K 5.9* 5.2* 4.3 4.0 3.2*  CL 89* 89* 95* 102 105  CO2 25 26 27 27 22   GLUCOSE 98 113* 114* 92 230*  BUN 102* 95* 74* 44* 26*  CREATININE 4.46* 4.01* 2.82* 1.60* 1.20  CALCIUM 8.7* 8.9 8.7* 8.6* 7.5*  MG 2.5*  --  2.1 1.8 1.6*  PHOS 6.4*  --  4.1 2.2* 1.6*   GFR: Estimated Creatinine Clearance: 52.7 mL/min (by C-G formula based on SCr of 1.2 mg/dL). Recent Labs  Lab 10/08/20 1234 10/08/20 1244 10/09/20 0349 10/10/20 0652 10/11/20 0401 10/12/20 0357  WBC  --    < > 7.6 7.6 7.0 7.3  LATICACIDVEN 1.0  --   --   --   --   --    < > = values in this interval not displayed.    Liver Function Tests: Recent Labs  Lab 10/08/20 1244  AST 27  ALT 19  ALKPHOS 129*  BILITOT 0.8  PROT 5.9*  ALBUMIN 2.7*   No results for input(s): LIPASE, AMYLASE in the last 168 hours. No results for input(s): AMMONIA in the last 168 hours.  ABG    Component Value Date/Time   PHART 7.437 12/27/2018 0957   PCO2ART 35.5 12/27/2018 0957   PO2ART 99.6 12/27/2018 0957  HCO3 23.6 12/27/2018 0957   TCO2 34 (H) 11/09/2019 0924   ACIDBASEDEF 0.1 12/27/2018 0957   O2SAT 97.4 12/27/2018 0957     Coagulation Profile: Recent Labs  Lab 10/08/20 1244  INR 1.6*    Cardiac Enzymes: No results for input(s): CKTOTAL, CKMB, CKMBINDEX, TROPONINI in the last 168 hours.  HbA1C: Hgb A1c MFr Bld  Date/Time Value Ref Range Status  12/27/2018 09:58 AM 5.9 (H) 4.8 - 5.6 % Final    Comment:    (NOTE) Pre diabetes:          5.7%-6.4% Diabetes:              >6.4% Glycemic control for   <7.0% adults with diabetes   06/11/2017 05:40 PM 5.3 4.8 - 5.6 % Final    Comment:    (NOTE) Pre diabetes:          5.7%-6.4% Diabetes:              >6.4% Glycemic control for   <7.0% adults with diabetes     CBG: Recent Labs  Lab 10/08/20 1619 10/08/20 1651 10/10/20 0732  GLUCAP 87 91 111*      Level 3 follow-up     Multidisciplinary rounds were performed  with the ICU team.  Care coordination was performed with the bedside nurse.  Patient to be transferred to hospitalist service in a.m.   Renold Don, MD [place] [facility]   *This note was dictated using voice recognition software/Dragon.  Despite best efforts to proofread, errors can occur which can change the meaning.  Any change was purely unintentional.

## 2020-10-12 NOTE — Progress Notes (Signed)
Occupational Therapy Treatment Patient Details Name: Carl [name] MRN: [id number] DOB: October 30, 1950 Today's Date: 10/12/2020    History of present illness PAOLO OKANE is a 70 y.o. male with medical history significant for chronic systolic heart failure with most recent echocardiogram showing LVEF 20% status post AICD placement, paroxysmal atrial fibrillation chronically anticoagulated on Eliquis, stage IV chronic kidney disease with baseline creatinine 2.2-2.7, chronic blood loss anemia with baseline hemoglobin of 7-9, cirrhosis.  He came in 2/14, noted to be in shock, bradycardic, hypothermic, profoundly anemic and an acute kidney injury.  Critical care was consulted for further evaluation, requiring vasopressors.   OT comments  Mr Gago was seen for OT treatment on this date. Upon arrival to room pt reclined to L side of bed, presents with decreased cognition compared to prior session. MOD A sitting balance 2/2 posterior lean and unable to self correct. MIN A sit<>stand x3 trials decreasing to MOD A sit<>stand x3 trials - pt reports fatigue. Pt completes ~3 steps forward/backward for x4 trials - MOD A 2/2 posterior lean noted. Pt making good progress toward goals. Pt continues to benefit from skilled OT services to maximize return to PLOF and minimize risk of future falls, injury, caregiver burden, and readmission. Will continue to follow POC. Discharge recommendation remains appropriate.    Follow Up Recommendations  SNF    Equipment Recommendations  Other (comment) (TBD)    Recommendations for Other Services      Precautions / Restrictions Precautions Precautions: Fall Restrictions Weight Bearing Restrictions: No       Mobility Bed Mobility Overal bed mobility: Needs Assistance Bed Mobility: Supine to Sit;Sit to Supine     Supine to sit: Mod assist Sit to supine: Mod assist      Transfers Overall transfer level: Needs assistance   Transfers: Sit to/from Stand Sit to  Stand: Min assist         General transfer comment: MIN A sit<>stand x3 trials decreasing to MOD A sit<>stand x3 trials - pt reports fatigue.    Balance Overall balance assessment: Needs assistance Sitting-balance support: Bilateral upper extremity supported Sitting balance-Leahy Scale: Poor Sitting balance - Comments: unable to correct posterior lean Postural control: Posterior lean Standing balance support: Bilateral upper extremity supported Standing balance-Leahy Scale: Poor                             ADL either performed or assessed with clinical judgement   ADL Overall ADL's : Needs assistance/impaired                                       General ADL Comments: MAX A for LBD at bed level. MIN A sit<>stand decreasing to MOD A for ~3 steps forward/backward - assist for posterior lean               Cognition Arousal/Alertness: Awake/alert Behavior During Therapy: WFL for tasks assessed/performed Overall Cognitive Status: Difficult to assess                                 General Comments: Difficult to understand and HOH making some communication difficult. Perseverative on buying a [employer] for $500        Exercises Exercises: Other exercises Other Exercises Other Exercises: Pt educated re: re-oriented, falls prevention,  ECS Other Exercises: LBD, sup<>sit, sit<>stand x6, sitting/standing balance/tolerance   Shoulder Instructions       General Comments      Pertinent Vitals/ Pain       Pain Assessment: Faces Faces Pain Scale: Hurts a little bit Pain Location: back Pain Descriptors / Indicators: Discomfort;Dull;Grimacing Pain Intervention(s): Limited activity within patient's tolerance         Frequency  Min 1X/week        Progress Toward Goals  OT Goals(current goals can now be found in the care plan section)  Progress towards OT goals: Progressing toward goals  Acute Rehab OT Goals Patient  Stated Goal: get well enough to go home OT Goal Formulation: With patient Time For Goal Achievement: 10/24/20 Potential to Achieve Goals: Good ADL Goals Pt Will Perform Grooming: with supervision;with set-up;sitting Pt Will Perform Lower Body Dressing: with mod assist;sit to/from stand Pt Will Transfer to Toilet: with min guard assist;stand pivot transfer;bedside commode  Plan Discharge plan remains appropriate;Frequency remains appropriate       AM-PAC OT "6 Clicks" Daily Activity     Outcome Measure   Help from another person eating meals?: A Little Help from another person taking care of personal grooming?: A Lot Help from another person toileting, which includes using toliet, bedpan, or urinal?: A Lot Help from another person bathing (including washing, rinsing, drying)?: A Lot Help from another person to put on and taking off regular upper body clothing?: A Little Help from another person to put on and taking off regular lower body clothing?: A Lot 6 Click Score: 14    End of Session    OT Visit Diagnosis: Other abnormalities of gait and mobility (R26.89);Muscle weakness (generalized) (M62.81)   Activity Tolerance Patient tolerated treatment well   Patient Left in bed;with call bell/phone within reach   Nurse Communication Mobility status        Time: 8938-1017 OT Time Calculation (min): 31 min  Charges: OT General Charges $OT Visit: 1 Visit OT Treatments $Self Care/Home Management : 23-37 mins  Dessie Coma, M.S. OTR/L  10/12/20, 2:20 PM  ascom [phone]

## 2020-10-12 NOTE — Progress Notes (Signed)
[facility]  ROUNDING NOTE   Subjective:   UOP 247mL Creatinine 1.2 (1.6) (2.82) (4.01) Na 133 (136) (130) (125) K 3.2 ( 4) (4.3) (5.2)  NS at 125mL/hr Norepinephrine gtt   Objective:  Vital signs in last 24 hours:  Temp:  [98.7 F (37.1 C)-99 F (37.2 C)] 98.7 F (37.1 C) (02/18 0400) Pulse Rate:  [41-82] 69 (02/18 0630) Resp:  [11-26] 26 (02/18 0630) BP: (87-130)/(45-83) 116/78 (02/18 0630) SpO2:  [93 %-100 %] 98 % (02/18 0630)  Weight change:  Filed Weights   10/08/20 1233 10/08/20 1653  Weight: 70 kg 64.1 kg    Intake/Output: I/O last 3 completed shifts: In: 4882.1 [P.O.:360; I.V.:4320.6; Other:200; IV Piggyback:1.5] Out: 9924 [Urine:4160]   Intake/Output this shift:  No intake/output data recorded.  Physical Exam: General: Ill appearing  Head: Moist oral mucosal membranes  Eyes: +hard of hearing  Neck: Supple, trachea midline  Lungs:  Clear to auscultation  Heart: Regular rate and rhythm  Abdomen:  Soft, nontender,   Extremities: no peripheral edema.  Neurologic: confused  Skin: No lesions  Access: none    Basic Metabolic Panel: Recent Labs  Lab 10/09/20 0349 10/09/20 1253 10/10/20 0652 10/11/20 0401 10/12/20 0357  NA 126* 125* 130* 136 133*  K 5.9* 5.2* 4.3 4.0 3.2*  CL 89* 89* 95* 102 105  CO2 25 26 27 27 22   GLUCOSE 98 113* 114* 92 230*  BUN 102* 95* 74* 44* 26*  CREATININE 4.46* 4.01* 2.82* 1.60* 1.20  CALCIUM 8.7* 8.9 8.7* 8.6* 7.5*  MG 2.5*  --  2.1 1.8 1.6*  PHOS 6.4*  --  4.1 2.2* 1.6*    Liver Function Tests: Recent Labs  Lab 10/08/20 1244  AST 27  ALT 19  ALKPHOS 129*  BILITOT 0.8  PROT 5.9*  ALBUMIN 2.7*   No results for input(s): LIPASE, AMYLASE in the last 168 hours. No results for input(s): AMMONIA in the last 168 hours.  CBC: Recent Labs  Lab 10/08/20 1244 10/08/20 1910 10/09/20 0349 10/10/20 0652 10/11/20 0401 10/12/20 0357  WBC 6.0 7.0 7.6 7.6 7.0 7.3  NEUTROABS 4.4  --   --   --   --    --   HGB 6.6* 10.6* 10.1* 10.7* 9.5* 8.0*  HCT 21.3* 33.0* 30.6* 32.8* 29.8* 25.9*  MCV 102.9* 96.8 94.4 94.8 95.8 99.6  PLT 356 386 381 386 376 297    Cardiac Enzymes: No results for input(s): CKTOTAL, CKMB, CKMBINDEX, TROPONINI in the last 168 hours.  BNP: Invalid input(s): POCBNP  CBG: Recent Labs  Lab 10/08/20 1619 10/08/20 1651 10/10/20 0732  GLUCAP 87 91 111*    Microbiology: Results for orders placed or performed during the hospital encounter of 10/08/20  Blood Culture (routine x 2)     Status: None (Preliminary result)   Collection Time: 10/08/20 12:35 PM   Specimen: BLOOD  Result Value Ref Range Status   Specimen Description BLOOD LEFT ANTECUBITAL  Final   Special Requests   Final    BOTTLES DRAWN AEROBIC AND ANAEROBIC Blood Culture adequate volume   Culture   Final    NO GROWTH 3 DAYS Performed at [facility], [address] [place], [place] [address]    Report Status PENDING  Incomplete  Blood Culture (routine x 2)     Status: None (Preliminary result)   Collection Time: 10/08/20 12:35 PM   Specimen: BLOOD  Result Value Ref Range Status   Specimen Description BLOOD RIGHT ANTECUBITAL  Final   Special Requests   Final    BOTTLES DRAWN AEROBIC AND ANAEROBIC Blood Culture adequate volume   Culture   Final    NO GROWTH 3 DAYS Performed at [facility], [address] [place], [place] [address]    Report Status PENDING  Incomplete  Resp Panel by RT-PCR (Flu A&B, Covid) Nasopharyngeal Swab     Status: None   Collection Time: 10/08/20 12:44 PM   Specimen: Nasopharyngeal Swab; Nasopharyngeal(NP) swabs in vial transport medium  Result Value Ref Range Status   SARS Coronavirus 2 by RT PCR NEGATIVE NEGATIVE Final    Comment: (NOTE) SARS-CoV-2 target nucleic acids are NOT DETECTED.  The SARS-CoV-2 RNA is generally detectable in upper respiratory specimens during the acute phase of infection. The lowest concentration of SARS-CoV-2 viral  copies this assay can detect is 138 copies/mL. A negative result does not preclude SARS-Cov-2 infection and should not be used as the sole basis for treatment or other patient management decisions. A negative result may occur with  improper specimen collection/handling, submission of specimen other than nasopharyngeal swab, presence of viral mutation(s) within the areas targeted by this assay, and inadequate number of viral copies(<138 copies/mL). A negative result must be combined with clinical observations, patient history, and epidemiological information. The expected result is Negative.  Fact Sheet for Patients:  EntrepreneurPulse.com.au  Fact Sheet for Healthcare Providers:  IncredibleEmployment.be  This test is no t yet approved or cleared by the [place] FDA and  has been authorized for detection and/or diagnosis of SARS-CoV-2 by FDA under an Emergency Use Authorization (EUA). This EUA will remain  in effect (meaning this test can be used) for the duration of the COVID-19 declaration under Section 564(b)(1) of the Act, 21 U.S.C.section 360bbb-3(b)(1), unless the authorization is terminated  or revoked sooner.       Influenza A by PCR NEGATIVE NEGATIVE Final   Influenza B by PCR NEGATIVE NEGATIVE Final    Comment: (NOTE) The Xpert Xpress SARS-CoV-2/FLU/RSV plus assay is intended as an aid in the diagnosis of influenza from Nasopharyngeal swab specimens and should not be used as a sole basis for treatment. Nasal washings and aspirates are unacceptable for Xpert Xpress SARS-CoV-2/FLU/RSV testing.  Fact Sheet for Patients: EntrepreneurPulse.com.au  Fact Sheet for Healthcare Providers: IncredibleEmployment.be  This test is not yet approved or cleared by the [place] FDA and has been authorized for detection and/or diagnosis of SARS-CoV-2 by FDA under an Emergency Use Authorization (EUA). This  EUA will remain in effect (meaning this test can be used) for the duration of the COVID-19 declaration under Section 564(b)(1) of the Act, 21 U.S.C. section 360bbb-3(b)(1), unless the authorization is terminated or revoked.  Performed at [facility], [address] [place], [place] [address]   MRSA PCR Screening     Status: None   Collection Time: 10/08/20  4:53 PM   Specimen: Nasopharyngeal  Result Value Ref Range Status   MRSA by PCR NEGATIVE NEGATIVE Final    Comment:        The GeneXpert MRSA Assay (FDA approved for NASAL specimens only), is one component of a comprehensive MRSA colonization surveillance program. It is not intended to diagnose MRSA infection nor to guide or monitor treatment for MRSA infections. Performed at [facility], [address] [place], [place] [address]    *Note: Due to a large number of results and/or encounters for the requested time period, some results have not been displayed. A complete  set of results can be found in Results Review.    Coagulation Studies: No results for input(s): LABPROT, INR in the last 72 hours.  Urinalysis: No results for input(s): COLORURINE, LABSPEC, PHURINE, GLUCOSEU, HGBUR, BILIRUBINUR, KETONESUR, PROTEINUR, UROBILINOGEN, NITRITE, LEUKOCYTESUR in the last 72 hours.  Invalid input(s): APPERANCEUR    Imaging: DG Chest Port 1 View  Result Date: 10/11/2020 CLINICAL DATA:  Evaluate PICC line insertion. EXAM: PORTABLE CHEST 1 VIEW COMPARISON:  10/10/2020 FINDINGS: Left chest wall pacer device is noted with lead in the right ventricle. The right arm PICC line tip is in the cavoatrial junction. Stable mild cardiac enlargement. Left pleural effusion is again seen. There is diffuse interstitial and airspace densities within both lungs which appear unchanged. Remote right rib fractures are again identified. IMPRESSION: 1. Right arm PICC line tip is at the cavoatrial junction. 2. Stable left pleural  effusion and diffuse interstitial and airspace densities within both lungs. Electronically Signed   By: Kerby Moors M.D.   On: 10/11/2020 15:08   DG Chest Port 1 View  Result Date: 10/10/2020 CLINICAL DATA:  Hypotension, edema EXAM: PORTABLE CHEST 1 VIEW COMPARISON:  10/08/2020 FINDINGS: Cardiomegaly, vascular congestion. Diffuse interstitial prominence throughout the lungs, favor edema. No real change since prior study. Left AICD remains in place, unchanged. Prior median sternotomy/CABG. No effusions or pneumothorax. Old right rib fractures again noted, unchanged. IMPRESSION: Cardiomegaly, vascular congestion and mild pulmonary edema. No real change. Electronically Signed   By: Rolm Baptise M.D.   On: 10/10/2020 12:19     Medications:   . sodium chloride Stopped (10/09/20 0745)  . sodium chloride Stopped (10/12/20 0553)  . magnesium sulfate bolus IVPB 50 mL/hr at 10/12/20 0555  . norepinephrine (LEVOPHED) Adult infusion 6 mcg/min (10/12/20 0555)  . potassium PHOSPHATE IVPB (in mmol) 45 mmol (10/12/20 0600)   . amiodarone  200 mg Oral Daily  . atorvastatin  40 mg Oral Daily  . Chlorhexidine Gluconate Cloth  6 each Topical Daily  . fludrocortisone  0.2 mg Oral Daily  . mouth rinse  15 mL Mouth Rinse BID  . midodrine  10 mg Oral TID WC  . pantoprazole  40 mg Oral Daily   docusate sodium, HYDROcodone-acetaminophen, morphine injection, ondansetron (ZOFRAN) IV, polyethylene glycol, traZODone  Assessment/ Plan:  Mr. Carl [name] is a 70 y.o. white male with coronary artery disease status post mitral valve repair, hepatic cirrhosis, congestive heart failure, AICD placement, hypertension, hyperlipidemia, peripheral vascular disease, peptic ulcer disease, hearing loss, who was admitted to [facility] on 10/08/2020 for Hypovolemic shock ([place]) [R57.1] Hyperkalemia [E87.5] Bradycardia [R00.1] Shock circulatory ([place]) [R57.9] Acute GI bleeding [K92.2] Dehydration, severe [E86.0] AKI (acute kidney  injury) ([place]) [N17.9]  1. Acute kidney injury: with hyperkalemia: on chronic kidney disease stage IIIB. Baseline creatinine of 2.2 with GFR of 32 on 07/03/20. History of bland urine.  Renal ultrasound reviewed. - Restart potassium, change to regular diet - Continue foley catheter - discontinue IV fluids and encourage PO intake - no acute indication for dialysis. Patient is documented as stating he was not interested in dialysis.    2. Hyponatremia: acute on chronic: baseline 130-135. Better than baseline home regimen includes metolazone - IV normal saline - Holding metolazone. Do not recommend restarting metolazone on discharge.   3. Anemia with acute kidney failure: Multiple PRBC transfusions. Hemoglobin stable currently  Will sign off. Please call with questions    LOS: 4 Manvir Thorson 2/18/20227:04 AM

## 2020-10-12 NOTE — Progress Notes (Addendum)
Physical Therapy Treatment Patient Details Name: Carl [name] MRN: [id number] DOB: 10-29-1950 Today's Date: 10/12/2020    History of Present Illness Carl [name] is a 70 y.o. male with medical history significant for chronic systolic heart failure with most recent echocardiogram showing LVEF 20% status post AICD placement, paroxysmal atrial fibrillation chronically anticoagulated on Eliquis, stage IV chronic kidney disease with baseline creatinine 2.2-2.7, chronic blood loss anemia with baseline hemoglobin of 7-9, cirrhosis.  He came in 2/14, noted to be in shock, bradycardic, hypothermic, profoundly anemic and an acute kidney injury.  Critical care was consulted for further evaluation, requiring vasopressors.    PT Comments    Pt in bed upon entry working on the final segment of a carbonated beverage via straw. Pressures ~90/14mmHg, remain within 64mm throughout session regardless of posture or activity. Pt denies any complaint common with hypotension, no dizziness. Min-modA for bed mobility. Pt able to sit at EOB for ~15-79min in session, given intermittent tactile cues for postural correction (to avoid slouch, slump, or Lt lean), generally steady in short sitting. Pt able to STS 3x, remain up x30sec each time (as legs grow tired), needs a lot of help c arrival to a balanced position with COM over BOS, but generally cannot problem solve to get there without help. Rising motor pattern is heavily dominant of knee extension, minimal hip extension component, hence he throws his trunk into a posterior lean each attempt resulting in loss of sock-friction-coefficient-efficacy on floor leading to subsequent beginnings of bilat pedal anterior slip-n-slide. In stance, pt does better with RW slightly anterior to accommodate postural tendencies. Pt does well with simple one step commands when loudly delivered in the peri-aural zone, but cannot attend to more complex commands or sustained attention required during  exercise. Appears comfortable throughout. Sister present for latter 50% of session.   Follow Up Recommendations  SNF;Supervision for mobility/OOB     Equipment Recommendations  None recommended by PT    Recommendations for Other Services       Precautions / Restrictions Precautions Precautions: Fall Restrictions Weight Bearing Restrictions: No    Mobility  Bed Mobility Overal bed mobility: Needs Assistance Bed Mobility: Supine to Sit;Sit to Supine     Supine to sit: Min assist;HOB elevated Sit to supine: Mod assist   General bed mobility comments: uses BUE to pull self to sitting EOB    Transfers Overall transfer level: Needs assistance Equipment used: Rolling walker (2 wheeled) Transfers: Sit to/from Stand Sit to Stand: Min assist;From elevated surface         General transfer comment: minA lift assist, total assist for balance correction, then can stand with RW balanced x30sec  Ambulation/Gait Ambulation/Gait assistance:  (deferred due to limited stamina, balance difficulty)               Stairs             Wheelchair Mobility    Modified Rankin (Stroke Patients Only)       Balance Overall balance assessment: Needs assistance Sitting-balance support: Bilateral upper extremity supported Sitting balance-Leahy Scale: Poor Sitting balance - Comments: unable to correct posterior lean Postural control: Posterior lean Standing balance support: Bilateral upper extremity supported Standing balance-Leahy Scale: Poor                              Cognition Arousal/Alertness: Awake/alert Behavior During Therapy: WFL for tasks assessed/performed;Flat affect Overall Cognitive Status: History of cognitive  impairments - at baseline                                 General Comments: VERY HOH, pleasant, interactive, does not attend to reps in exercise; talks often inappropriate to situation, very social      Exercises  General Exercises - Lower Extremity Short Arc Quad: AROM;Both;20 reps;Supine;Other (comment) Short Arc Quad Limitations: kinda turns it into a hybrid SLR despite extensive multimodal cues, but Pryor Curia is on board with this. Other Exercises Other Exercises: STS from EOB to static stance balance c RW 3x30sec (to point of fatigue) Other Exercises: sitting EOB x 15 minutes     General Comments        Pertinent Vitals/Pain Pain Assessment: Faces Faces Pain Scale: Hurts little more Pain Location: RUE grimacing 2/2 picc site Pain Descriptors / Indicators: Discomfort;Dull;Grimacing Pain Intervention(s): Limited activity within patient's tolerance;Monitored during session;Repositioned    Home Living                      Prior Function            PT Goals (current goals can now be found in the care plan section) Acute Rehab PT Goals Patient Stated Goal: get well enough to go home PT Goal Formulation: With patient Time For Goal Achievement: 10/24/20 Potential to Achieve Goals: Fair Progress towards PT goals: Progressing toward goals    Frequency    Min 2X/week      PT Plan Current plan remains appropriate    Co-evaluation              AM-PAC PT "6 Clicks" Mobility   Outcome Measure  Help needed turning from your back to your side while in a flat bed without using bedrails?: A Lot Help needed moving from lying on your back to sitting on the side of a flat bed without using bedrails?: A Lot Help needed moving to and from a bed to a chair (including a wheelchair)?: A Lot Help needed standing up from a chair using your arms (e.g., wheelchair or bedside chair)?: A Lot Help needed to walk in hospital room?: A Lot Help needed climbing 3-5 steps with a railing? : Total 6 Click Score: 11    End of Session Equipment Utilized During Treatment: Gait belt Activity Tolerance: Patient tolerated treatment well;No increased pain;Patient limited by fatigue Patient left:  with call bell/phone within reach;in bed;with family/visitor present   PT Visit Diagnosis: Muscle weakness (generalized) (M62.81);Difficulty in walking, not elsewhere classified (R26.2);Unsteadiness on feet (R26.81)     Time: 8309-4076 PT Time Calculation (min) (ACUTE ONLY): 29 min  Charges:  $Neuromuscular Re-education: 23-37 mins                     3:45 PM, 10/12/20 Etta Grandchild, PT, DPT Physical Therapist - [facility]  [phone] ([place])    Lares C 10/12/2020, 3:45 PM

## 2020-10-12 NOTE — Progress Notes (Signed)
Accepted transfer from PCCM. [facility] will assume care on 2/19.  PCCM mentioned end stage cardiac dz and plan for hospice once Palliative care evaluates. I've placed PC c/s

## 2020-10-12 NOTE — Plan of Care (Signed)
  Problem: Education: Goal: Knowledge of General Education information will improve Description: Including pain rating scale, medication(s)/side effects and non-pharmacologic comfort measures Outcome: Not Progressing: pt is confused and forgetful   Problem: Health Behavior/Discharge Planning: Goal: Ability to manage health-related needs will improve Outcome: Not Progressing: Pt need assist with ADL's, seeing things that are not there.   Problem: Clinical Measurements: Goal: Ability to maintain clinical measurements within normal limits will improve Outcome: Not Progressing Pt continue on Levo gtt, attempted to wean down was unsuccessful.  Goal: Diagnostic test results will improve Outcome: Not Progressing Pt Na is 133, mag is 1.6 and phos is 1.9. pt is getting 4mg  mag replacement and Potassium Phos gtt.

## 2020-10-13 DIAGNOSIS — N4829 Other inflammatory disorders of penis: Secondary | ICD-10-CM

## 2020-10-13 DIAGNOSIS — R627 Adult failure to thrive: Secondary | ICD-10-CM

## 2020-10-13 LAB — CBC
HCT: 26.5 % — ABNORMAL LOW (ref 39.0–52.0)
Hemoglobin: 8.2 g/dL — ABNORMAL LOW (ref 13.0–17.0)
MCH: 30.3 pg (ref 26.0–34.0)
MCHC: 30.9 g/dL (ref 30.0–36.0)
MCV: 97.8 fL (ref 80.0–100.0)
Platelets: 306 10*3/uL (ref 150–400)
RBC: 2.71 MIL/uL — ABNORMAL LOW (ref 4.22–5.81)
RDW: 18.4 % — ABNORMAL HIGH (ref 11.5–15.5)
WBC: 5.9 10*3/uL (ref 4.0–10.5)
nRBC: 0 % (ref 0.0–0.2)

## 2020-10-13 LAB — CULTURE, BLOOD (ROUTINE X 2)
Culture: NO GROWTH
Culture: NO GROWTH
Special Requests: ADEQUATE
Special Requests: ADEQUATE

## 2020-10-13 LAB — BASIC METABOLIC PANEL
Anion gap: 8 (ref 5–15)
BUN: 23 mg/dL (ref 8–23)
CO2: 25 mmol/L (ref 22–32)
Calcium: 8.8 mg/dL — ABNORMAL LOW (ref 8.9–10.3)
Chloride: 100 mmol/L (ref 98–111)
Creatinine, Ser: 1.23 mg/dL (ref 0.61–1.24)
GFR, Estimated: >60 mL/min (ref >60)
Glucose, Bld: 85 mg/dL (ref 70–99)
Potassium: 3.9 mmol/L (ref 3.5–5.1)
Sodium: 133 mmol/L — ABNORMAL LOW (ref 135–145)

## 2020-10-13 LAB — MAGNESIUM: Magnesium: 2.4 mg/dL (ref 1.7–2.4)

## 2020-10-13 LAB — PHOSPHORUS: Phosphorus: 3.2 mg/dL (ref 2.5–4.6)

## 2020-10-13 MED ORDER — ACETAMINOPHEN 325 MG PO TABS
650.0000 mg | ORAL_TABLET | Freq: Four times a day (QID) | ORAL | Status: DC | PRN
  Administered 2020-10-15 – 2020-10-16 (×2): 650 mg via ORAL
  Filled 2020-10-13 (×2): qty 2

## 2020-10-13 MED ORDER — FUROSEMIDE 20 MG PO TABS
20.0000 mg | ORAL_TABLET | Freq: Two times a day (BID) | ORAL | Status: DC
  Administered 2020-10-13 – 2020-10-15 (×4): 20 mg via ORAL
  Filled 2020-10-13 (×4): qty 1

## 2020-10-13 MED ORDER — NYSTATIN 100000 UNIT/ML MT SUSP
5.0000 mL | Freq: Four times a day (QID) | OROMUCOSAL | Status: DC
  Administered 2020-10-13 – 2020-10-16 (×10): 500000 [IU] via OROMUCOSAL
  Filled 2020-10-13 (×12): qty 5

## 2020-10-13 NOTE — Hospital Course (Addendum)
10/09/2020 admitted for generalized weakness and altered mental status 10/10/2020- Patient has improved, he is awake and sitting up in bed, weaning off of vasopressors today, continue IVF.  Net 2.5L positive fluid balance, will initiate PT/OT in next 12-24h . Goals of care meeting with family 10/10/2020 10/11/20- patient seems to be more stable today.  He is being medically optimized for dc to SNF with palliative 10/12/2020-weaning norepinephrine off 10/13/2020 -transfer to [facility] and to the floor 2/20 -waiting for SNF placement

## 2020-10-13 NOTE — Progress Notes (Signed)
1        [facility] [place] NAME: Carl [name]    MR#:  [id number]  DATE OF BIRTH:  1951-04-26  SUBJECTIVE:  CHIEF COMPLAINT:   Chief Complaint  Patient presents with  . Hypotension    50/30 blood pressure per ems , fluid around pacemaker, positive guiac,   Pleasantly confused.  Nursing reported swollen penis and genitalia REVIEW OF SYSTEMS:  Review of Systems  Constitutional: Positive for malaise/fatigue. Negative for diaphoresis, fever and weight loss.  HENT: Negative for ear discharge, ear pain, hearing loss, nosebleeds, sore throat and tinnitus.   Eyes: Negative for blurred vision and pain.  Respiratory: Negative for cough, hemoptysis, shortness of breath and wheezing.   Cardiovascular: Negative for chest pain, palpitations, orthopnea and leg swelling.  Gastrointestinal: Negative for abdominal pain, blood in stool, constipation, diarrhea, heartburn, nausea and vomiting.  Genitourinary: Negative for dysuria, frequency and urgency.  Musculoskeletal: Negative for back pain and myalgias.  Skin: Negative for itching and rash.  Neurological: Negative for dizziness, tingling, tremors, focal weakness, seizures, weakness and headaches.  Psychiatric/Behavioral: Positive for memory loss. Negative for depression. The patient is not nervous/anxious.    DRUG ALLERGIES:   Allergies  Allergen Reactions  . Spironolactone Other (See Comments)    gynecomastia    VITALS:  Blood pressure 107/71, pulse (!) 121, temperature 98 F (36.7 C), temperature source Oral, resp. rate 15, height 5\' 10"  (1.778 m), weight 65.6 kg, SpO2 94 %. PHYSICAL EXAMINATION:  Physical Exam  70 year old male looking older than his stated age General: Laying in the bed pleasantly confused, chronically ill looking Mouth: Oral thrush noted on tongue Neuro: Pleasantly confused, nonfocal exam Cardiovascular S1-S2 normal Pulmonary diminished breath sounds throughout GI soft, nontender GU  Foley in place.  Penile swelling Extremities: No pedal edema LABORATORY PANEL:  Male CBC Recent Labs  Lab 10/13/20 0503  WBC 5.9  HGB 8.2*  HCT 26.5*  PLT 306   ------------------------------------------------------------------------------------------------------------------ Chemistries  Recent Labs  Lab 10/08/20 1244 10/08/20 1435 10/13/20 0503  NA 122*   < > 133*  K 6.0*   < > 3.9  CL 89*   < > 100  CO2 21*   < > 25  GLUCOSE 90   < > 85  BUN 106*   < > 23  CREATININE 4.90*   < > 1.23  CALCIUM 8.1*   < > 8.8*  MG  --    < > 2.4  AST 27  --   --   ALT 19  --   --   ALKPHOS 129*  --   --   BILITOT 0.8  --   --    < > = values in this interval not displayed.   RADIOLOGY:  No results found. ASSESSMENT AND PLAN:  70 year old male with a known history of CAD/CABG, ischemic cardiomyopathy, PAF, cirrhosis, chronic AVM with history of GI bleed admitted for generalized weakness, severe anemia and altered mental status  10/09/2020 admitted for generalized weakness and altered mental status 10/10/2020- Patient has improved, he is awake and sitting up in bed, weaning off of vasopressors today, continue IVF.  Net 2.5L positive fluid balance, will initiate PT/OT in next 12-24h . Goals of care meeting with family 10/10/2020 10/11/20- patient seems to be more stable today.  He is being medically optimized for dc to SNF with palliative 10/12/2020-weaning norepinephrine off 10/13/2020 -transfer to [facility] and to the floor  Hypovolemic shock Present on admission  and requiring pressors which are weaned off, now resolved  Acute on chronic anemia Chronic GI bleed, no evidence of acute bleeding Admission hemoglobin of 6.6 -not a candidate for anticoagulation Conservative management per GI.  No plan for endoscopy.  Holding anticoagulation and continuing Protonix  Acute kidney injury Likely prerenal with hypovolemia. Creatinine 1.23 today  Adult failure to thrive Oral thrush We will start  nystatin swish and swallow Family requesting SNF with palliative care to follow at discharge  Acute metabolic encephalopathy present on admission Due to hypovolemic shock, anemia He seems close to his baseline mental status now  Cirrhosis without evidence of ascites Holding diuretics due to acute kidney injury  Severe ischemic cardiomyopathy Coronary artery disease/CABG Severe mitral valve regurgitation status post mitral valve clip EF of 20 to 25% Considering his net +3 L I will start him on Lasix 20 mg p.o. twice daily to diurese Net IO Since Admission: 3,005.68 mL [10/13/20 1618]  Paroxysmal A. Fib and history of junctional bradycardia Maintaining normal sinus rhythm now Not a candidate for anticoagulation due to GI bleed  Penile swelling Indwelling Foley while in the ICU Urology consult, discussed with Dr. Erlene Quan Nursing is requested to have Kerlix dressing for chart to reduce swelling  Family would like for him to be placed at SNF and have palliative care follow, they do want to keep him comfortable  Body mass index is 20.75 kg/m.  Net IO Since Admission: 3,005.68 mL [10/13/20 1603]   Status is: Inpatient  Remains inpatient appropriate because:Hemodynamically unstable   Dispo: The patient is from: Home              Anticipated d/c is to: SNF              Anticipated d/c date is: 2 days              Patient currently is not medically stable to d/c.   Difficult to place patient No   DVT prophylaxis:       SCDs Start: 10/08/20 1652     Family Communication: Updated brother [name] over phone on 2/19   All the records are reviewed and case discussed with Care Management/Social Worker. Management plans discussed with the patient, family/brother and they are in agreement.  CODE STATUS: DNR Level of care: Med-Surg  TOTAL TIME TAKING CARE OF THIS PATIENT: 35 minutes.   More than 50% of the time was spent in counseling/coordination of care: YES  POSSIBLE D/C  IN 2-3 DAYS, DEPENDING ON CLINICAL CONDITION.   Max Sane M.D on 10/13/2020 at 4:03 PM  [facility]   CC: Primary care physician; [name], MD  Note: This dictation was prepared with Dragon dictation along with smaller phrase technology. Any transcriptional errors that result from this process are unintentional.

## 2020-10-13 NOTE — Progress Notes (Addendum)
Pt transfer from ICU Daughter at bedside request update from MD. paged shah

## 2020-10-13 NOTE — Progress Notes (Signed)
Pt Alert and oriented to self. Very HOH. Symmetrical. Penis appears to be very swollen and foreskin is not replaced. Dr. Manuella Ghazi made aware and per Dr. Erlene Quan RN to attempt to replace foreskin. Foley removed. RN unsuccessful replacing skin. Per Dr. Erlene Quan wrap penis in kerlix tip to base to reduce swelling until she can see pt. Will continue to monitor.

## 2020-10-13 NOTE — H&P (Signed)
Urology Consult  I have been asked to see the patient by Dr. Manuella Ghazi, for evaluation and management of penile swelling.  Chief Complaint: penile swelling  History of Present Illness: Carl [name] is a 70 y.o. year old uncircumcised male admitted to the ICU for severe medical illness who had a Foley catheter placed upon admission.  This morning, his nurse noted that his foreskin was retracted, severely edematous along with his glans and penis.  Foley catheter was removed.  She was unable to reduce the phimosis and urology consult was requested.  Prior to my arriving,nursing staff did wrap the penis and Kerlix and Coban to help reduce some of the penile edema.  Past Medical History:  Diagnosis Date  . AICD (automatic cardioverter/defibrillator) present   . Anemia   . Anxiety   . Arthritis   . Brunner's gland hyperplasia of duodenum   . CHF (congestive heart failure) ([place])   . Chronic kidney disease   . Cirrhosis of liver ([place])   . Coronary artery disease   . Depression   . Dysrhythmia    atrial fib  . External hemorrhoids   . Fracture of femoral neck, left ([place]) 07/21/2018  . GERD (gastroesophageal reflux disease)   . Gout   . Hearing loss   . Heart murmur    mirtal valve insufficiency  . Hepatic failure ([name])   . HH (hiatus hernia)   . Hyperlipidemia   . Hypokalemia   . Hyposmolality   . Intellectual disability    mild  . Internal hemorrhoids   . Ischemic cardiomyopathy   . Leg fracture, left   . Myocardial infarction ([place])   . Osteoporosis   . Paronychia   . Peripheral vascular disease ([place])   . Pneumonia   . Psoriasis   . Psoriasis   . PUD (peptic ulcer disease)   . Schatzki's ring   . Tubular adenoma of colon   . Vitiligo     Past Surgical History:  Procedure Laterality Date  . ATRIAL SEPTAL DEFECT(ASD) CLOSURE N/A 12/30/2018   Procedure: ATRIAL SEPTAL DEFECT(ASD) CLOSURE;  Surgeon: [name], MD;  Location: [place] [facility];  Service:  Cardiovascular;  Laterality: N/A;  . CARDIAC SURGERY     ICD  . CARDIOVERSION N/A 11/09/2019   Procedure: CARDIOVERSION;  Surgeon: [name], MD;  Location: [facility] ENDOSCOPY;  Service: Cardiovascular;  Laterality: N/A;  . CARDIOVERSION N/A 12/21/2019   Procedure: CARDIOVERSION;  Surgeon: [name], MD;  Location: [place];  Service: Cardiovascular;  Laterality: N/A;  . CATARACT EXTRACTION W/PHACO Right 07/26/2020   Procedure: CATARACT EXTRACTION PHACO AND INTRAOCULAR LENS PLACEMENT ([place]) RIGHT VISION BLUE;  Surgeon: Birder Robson, MD;  Location: [facility] ORS;  Service: Ophthalmology;  Laterality: Right;  [place] 01:14.1 CDE 13.65 Fluid Pack Lot # [id number]  . COLONOSCOPY WITH ESOPHAGOGASTRODUODENOSCOPY (EGD)    . COLONOSCOPY WITH PROPOFOL N/A 08/24/2017   Procedure: COLONOSCOPY WITH PROPOFOL;  Surgeon: [name], MD;  Location: [facility] ENDOSCOPY;  Service: Endoscopy;  Laterality: N/A;  . COLONOSCOPY WITH PROPOFOL N/A 07/23/2020   Procedure: COLONOSCOPY WITH PROPOFOL;  Surgeon: Toledo, [name], MD;  Location: [facility] ENDOSCOPY;  Service: Gastroenterology;  Laterality: N/A;  . CORONARY ARTERY BYPASS GRAFT N/A 08/03/2014   Procedure: CORONARY ARTERY BYPASS GRAFTING (CABG);  Surgeon: [name], MD;  Location: [place];  Service: Open Heart Surgery;  Laterality: N/A;  . ENTEROSCOPY N/A 08/06/2020   Procedure: ENTEROSCOPY;  Surgeon: Jonathon Bellows, MD;  Location: [facility] ENDOSCOPY;  Service: Gastroenterology;  Laterality: N/A;  . EP IMPLANTABLE DEVICE N/A 05/01/2015   MDT ICD implanted for primary prevention of sudden death  . ESOPHAGOGASTRODUODENOSCOPY N/A 06/28/2020   Procedure: ESOPHAGOGASTRODUODENOSCOPY (EGD);  Surgeon: Clarene Essex, MD;  Location: [place];  Service: Endoscopy;  Laterality: N/A;  . ESOPHAGOGASTRODUODENOSCOPY (EGD) WITH PROPOFOL N/A 04/16/2015   Procedure: ESOPHAGOGASTRODUODENOSCOPY (EGD) WITH PROPOFOL;  Surgeon: [name], MD;  Location: [facility] ENDOSCOPY;   Service: Endoscopy;  Laterality: N/A;  . ESOPHAGOGASTRODUODENOSCOPY (EGD) WITH PROPOFOL N/A 08/24/2017   Procedure: ESOPHAGOGASTRODUODENOSCOPY (EGD) WITH PROPOFOL;  Surgeon: [name], MD;  Location: [facility] ENDOSCOPY;  Service: Endoscopy;  Laterality: N/A;  . FRACTURE SURGERY Left    ORIF lower leg  . GIVENS CAPSULE STUDY N/A 08/03/2020   Procedure: GIVENS CAPSULE STUDY;  Surgeon: Jonathon Bellows, MD;  Location: [facility] ENDOSCOPY;  Service: Gastroenterology;  Laterality: N/A;  . HERNIA REPAIR    . HIP PINNING,CANNULATED Left 07/22/2018   Procedure: CANNULATED HIP PINNING;  Surgeon: [name], MD;  Location: [place];  Service: Orthopedics;  Laterality: Left;  . MITRAL VALVE REPAIR N/A 12/30/2018   Procedure: MITRAL VALVE REPAIR;  Surgeon: [name], MD;  Location: [place] [facility];  Service: Cardiovascular;  Laterality: N/A;  . RIGHT/LEFT HEART CATH AND CORONARY/GRAFT ANGIOGRAPHY N/A 12/21/2018   Procedure: RIGHT/LEFT HEART CATH AND CORONARY/GRAFT ANGIOGRAPHY;  Surgeon: [name], MD;  Location: [place] [facility];  Service: Cardiovascular;  Laterality: N/A;  . TEE WITHOUT CARDIOVERSION N/A 08/03/2014   Procedure: TRANSESOPHAGEAL ECHOCARDIOGRAM (TEE);  Surgeon: [name], MD;  Location: [place];  Service: Open Heart Surgery;  Laterality: N/A;  . TEE WITHOUT CARDIOVERSION N/A 02/10/2018   Procedure: TRANSESOPHAGEAL ECHOCARDIOGRAM (TEE);  Surgeon: [name], MD;  Location: [facility] ENDOSCOPY;  Service: Cardiovascular;  Laterality: N/A;  . TEE WITHOUT CARDIOVERSION N/A 11/09/2019   Procedure: TRANSESOPHAGEAL ECHOCARDIOGRAM (TEE);  Surgeon: [name], MD;  Location: [facility] ENDOSCOPY;  Service: Cardiovascular;  Laterality: N/A;    Home Medications:  Current Meds  Medication Sig  . acetaminophen (TYLENOL) 500 MG tablet Take 500-1,000 mg by mouth every 6 (six) hours as needed (for pain.).  Marland Kitchen allopurinol (ZYLOPRIM) 100 MG tablet Take 100 mg by mouth daily.   Marland Kitchen amiodarone  (PACERONE) 200 MG tablet Take 1 tablet (200 mg total) by mouth daily.  Marland Kitchen apixaban (ELIQUIS) 2.5 MG TABS tablet Take 1 tablet (2.5 mg total) by mouth 2 (two) times daily.  . Ascorbic Acid (VITAMIN C) 100 MG tablet Take 100 mg by mouth daily.  Marland Kitchen atorvastatin (LIPITOR) 40 MG tablet TAKE 1 TABLET (40 MG TOTAL) BY MOUTH DAILY AT 6 PM.  . carvedilol (COREG) 3.125 MG tablet TAKE 1 TABLET (3.125 MG TOTAL) BY MOUTH 2 (TWO) TIMES DAILY.  Marland Kitchen Cholecalciferol 50 MCG (2000 UT) TABS Take 2,000 Units by mouth at bedtime.   . Cyanocobalamin (VITAMIN B 12) 500 MCG TABS Take 500 mg by mouth daily.  . cyanocobalamin 500 MCG tablet Take 500 mcg by mouth daily.  Marland Kitchen docusate sodium (COLACE) 100 MG capsule Take 100 mg by mouth daily as needed for mild constipation.  . DULoxetine (CYMBALTA) 30 MG capsule Take 30 mg by mouth at bedtime.   . ferrous sulfate 325 (65 FE) MG tablet Take 1 tablet (325 mg total) by mouth daily with breakfast.  . gabapentin (NEURONTIN) 300 MG capsule Take 300 mg by mouth daily.  Marland Kitchen ketoconazole (NIZORAL) 2 % shampoo Apply 1 application topically 2 (two)  times a week. Mon and Thu  . lactulose (CHRONULAC) 10 GM/15ML solution Take 30 mLs (20 g total) by mouth daily.  . metolazone (ZAROXOLYN) 2.5 MG tablet Take 1 tablet twice a week, every Monday and Thursday. Take 20 mEq Potassium with Metolazone.  . Multiple Vitamin (MULTIVITAMIN) tablet Take 1 tablet by mouth at bedtime.   . Nutritional Supplements (ENSURE COMPLETE SHAKE) LIQD Take 1 Can by mouth 3 (three) times daily. Between meals  . pantoprazole (PROTONIX) 40 MG tablet TAKE 1 TABLET BY MOUTH EVERY DAY  . potassium chloride SA (KLOR-CON M20) 20 MEQ tablet Take 1 tablet (20 mEq total) by mouth daily.  . sucralfate (CARAFATE) 1 g tablet Take 1 tablet (1 g total) by mouth 2 (two) times daily.  Marland Kitchen torsemide (DEMADEX) 20 MG tablet Take 4 tablets (80 mg total) by mouth every morning AND 2 tablets (40 mg total) every evening.    Allergies:  Allergies   Allergen Reactions  . Spironolactone Other (See Comments)    gynecomastia     Family History  Problem Relation Age of Onset  . Valvular heart disease Mother        Ruptured valve  . CAD Father   . Heart Problems Brother        Stents x 4  . Diabetes Brother   . Prostate cancer Neg Hx   . Bladder Cancer Neg Hx   . Kidney cancer Neg Hx     Social History:  reports that he quit smoking about 8 years ago. He has never used smokeless tobacco. He reports that he does not drink alcohol and does not use drugs.  ROS: Unable to perform due to mental status  Physical Exam:  Vital signs in last 24 hours: Temp:  [97.2 F (36.2 C)-98.9 F (37.2 C)] 97.2 F (36.2 C) (02/19 1615) Pulse Rate:  [58-121] 64 (02/19 1615) Resp:  [12-31] 16 (02/19 1615) BP: (86-110)/(53-81) 99/68 (02/19 1615) SpO2:  [94 %-100 %] 96 % (02/19 1615) Weight:  [65.6 kg] 65.6 kg (02/19 0500) Constitutional:  Alert, nonconversive.  Shakes head yes and no appropriately. HEENT: [place] AT, moist mucus membranes.  Trachea midline, no masses Cardiovascular: Regular rate and rhythm, no clubbing, cyanosis, or edema. Respiratory: Normal respiratory effort, lungs clear bilaterally GI: Abdomen is soft, nontender, nondistended, no abdominal masses GU: Penile dressing was removed.  Moderately edematous penile foreskin with relatively tight fibrotic ring and edema of the glans.  I had the patient premedicated with morphine and upon taking down the dressing, squeeze the penis relatively firmly for approximately 1 minute.  At this time, I was able to reduce the edema enough that the foreskin was able to be fully retracted back over the glans thereby reducing the paraphimosis. Neurologic: Grossly intact, no focal deficits, moving all 4 extremities Psychiatric: Normal mood and affect  Impression/ Plan:  1.  Paraphimosis- bedside wrap and maneuver successful this afternoon at reducing paraphimosis.  Foley catheter is already been  removed.  If the catheter is replaced, please ensure that the foreskin is placed back in its normal anatomic position covering the glans.  Supportive care for edema.  Should resolve for the next 1 to 2 days.  Please call if there is any questions or concerns.   10/13/2020, 4:20 PM  Hollice Espy,  MD

## 2020-10-14 DIAGNOSIS — N472 Paraphimosis: Secondary | ICD-10-CM

## 2020-10-14 LAB — CBC
HCT: 27 % — ABNORMAL LOW (ref 39.0–52.0)
Hemoglobin: 8.4 g/dL — ABNORMAL LOW (ref 13.0–17.0)
MCH: 30.7 pg (ref 26.0–34.0)
MCHC: 31.1 g/dL (ref 30.0–36.0)
MCV: 98.5 fL (ref 80.0–100.0)
Platelets: 297 10*3/uL (ref 150–400)
RBC: 2.74 MIL/uL — ABNORMAL LOW (ref 4.22–5.81)
RDW: 18.3 % — ABNORMAL HIGH (ref 11.5–15.5)
WBC: 5.4 10*3/uL (ref 4.0–10.5)
nRBC: 0 % (ref 0.0–0.2)

## 2020-10-14 LAB — BASIC METABOLIC PANEL
Anion gap: 9 (ref 5–15)
BUN: 23 mg/dL (ref 8–23)
CO2: 23 mmol/L (ref 22–32)
Calcium: 8.7 mg/dL — ABNORMAL LOW (ref 8.9–10.3)
Chloride: 103 mmol/L (ref 98–111)
Creatinine, Ser: 1.29 mg/dL — ABNORMAL HIGH (ref 0.61–1.24)
GFR, Estimated: >60 mL/min (ref >60)
Glucose, Bld: 94 mg/dL (ref 70–99)
Potassium: 3.9 mmol/L (ref 3.5–5.1)
Sodium: 135 mmol/L (ref 135–145)

## 2020-10-14 LAB — MAGNESIUM: Magnesium: 2.2 mg/dL (ref 1.7–2.4)

## 2020-10-14 LAB — PHOSPHORUS: Phosphorus: 3.6 mg/dL (ref 2.5–4.6)

## 2020-10-14 NOTE — Progress Notes (Signed)
1        [place] [facility] [place] NAME: Carl [name]    MR#:  [id number]  DATE OF BIRTH:  1951/08/19  SUBJECTIVE:  CHIEF COMPLAINT:   Chief Complaint  Patient presents with  . Hypotension    50/30 blood pressure per ems , fluid around pacemaker, positive guiac,   Pleasantly confused.  No other issues REVIEW OF SYSTEMS:  Review of Systems  Constitutional: Positive for malaise/fatigue. Negative for diaphoresis, fever and weight loss.  HENT: Negative for ear discharge, ear pain, hearing loss, nosebleeds, sore throat and tinnitus.   Eyes: Negative for blurred vision and pain.  Respiratory: Negative for cough, hemoptysis, shortness of breath and wheezing.   Cardiovascular: Negative for chest pain, palpitations, orthopnea and leg swelling.  Gastrointestinal: Negative for abdominal pain, blood in stool, constipation, diarrhea, heartburn, nausea and vomiting.  Genitourinary: Negative for dysuria, frequency and urgency.  Musculoskeletal: Negative for back pain and myalgias.  Skin: Negative for itching and rash.  Neurological: Negative for dizziness, tingling, tremors, focal weakness, seizures, weakness and headaches.  Psychiatric/Behavioral: Positive for memory loss. Negative for depression. The patient is not nervous/anxious.    DRUG ALLERGIES:   Allergies  Allergen Reactions  . Spironolactone Other (See Comments)    gynecomastia    VITALS:  Blood pressure 98/64, pulse 75, temperature 97.9 F (36.6 C), resp. rate 16, height 5\' 10"  (1.778 m), weight 67 kg, SpO2 96 %. PHYSICAL EXAMINATION:  Physical Exam  70 year old male looking older than his stated age General: Laying in the bed pleasantly confused, chronically ill looking Mouth: Oral thrush noted on tongue Neuro: Pleasantly confused, nonfocal exam Cardiovascular S1-S2 normal Pulmonary diminished breath sounds throughout GI soft, nontender GU Foley in place.  Penile swelling Extremities: No pedal  edema LABORATORY PANEL:  Male CBC Recent Labs  Lab 10/14/20 0327  WBC 5.4  HGB 8.4*  HCT 27.0*  PLT 297   ------------------------------------------------------------------------------------------------------------------ Chemistries  Recent Labs  Lab 10/08/20 1244 10/08/20 1435 10/14/20 0327  NA 122*   < > 135  K 6.0*   < > 3.9  CL 89*   < > 103  CO2 21*   < > 23  GLUCOSE 90   < > 94  BUN 106*   < > 23  CREATININE 4.90*   < > 1.29*  CALCIUM 8.1*   < > 8.7*  MG  --    < > 2.2  AST 27  --   --   ALT 19  --   --   ALKPHOS 129*  --   --   BILITOT 0.8  --   --    < > = values in this interval not displayed.   RADIOLOGY:  No results found. ASSESSMENT AND PLAN:  70 year old male with a known history of CAD/CABG, ischemic cardiomyopathy, PAF, cirrhosis, chronic AVM with history of GI bleed admitted for generalized weakness, severe anemia and altered mental status  10/09/2020 admitted for generalized weakness and altered mental status 10/10/2020- Patient has improved, he is awake and sitting up in bed, weaning off of vasopressors today, continue IVF.  Net 2.5L positive fluid balance, will initiate PT/OT in next 12-24h . Goals of care meeting with family 10/10/2020 10/11/20- patient seems to be more stable today.  He is being medically optimized for dc to SNF with palliative 10/12/2020-weaning norepinephrine off 10/13/2020 -transfer to [facility] and to the floor 2/20 -waiting for SNF placement  Hypovolemic shock Present on admission and initially  required pressors which are weaned off, now resolved  Acute on chronic anemia Chronic GI bleed, no evidence of acute bleeding Admission hemoglobin of 6.6 -not a candidate for anticoagulation Conservative management per GI.  No plan for endoscopy.  Holding anticoagulation and continuing Protonix  Acute kidney injury Likely prerenal with hypovolemia. Creatinine 1.29 today  Adult failure to thrive Oral thrush - nystatin swish and  swallow Family requesting SNF with palliative care to follow at discharge  Acute metabolic encephalopathy present on admission Due to hypovolemic shock, anemia He seems close to his baseline mental status now  Cirrhosis without evidence of ascites Holding diuretics due to acute kidney injury  Severe ischemic cardiomyopathy Coronary artery disease/CABG Severe mitral valve regurgitation status post mitral valve clip EF of 20 to 25% Considering his net +3 L I will start him on Lasix 20 mg p.o. twice daily to diurese Net IO Since Admission: 2,700.68 mL [10/14/20 1403]  Paroxysmal A. Fib and history of junctional bradycardia Maintaining normal sinus rhythm now Not a candidate for anticoagulation due to GI bleed  Paraphimosis Status post Foley removal.  Supportive care for edema per urology  Family would like for him to be placed at SNF and have palliative care follow, they do want to keep him comfortable  Body mass index is 21.18 kg/m.  Net IO Since Admission: 2,700.68 mL [10/14/20 1403]   Status is: Inpatient  Remains inpatient appropriate because:Hemodynamically unstable   Dispo: The patient is from: Home              Anticipated d/c is to: SNF              Anticipated d/c date is: 1 day              Patient currently is not medically stable to d/c.   Difficult to place patient No   DVT prophylaxis:       SCDs Start: 10/08/20 1652     Family Communication: Updated brother [name] over phone on 2/19   All the records are reviewed and case discussed with Care Management/Social Worker. Management plans discussed with the patient, family/brother and they are in agreement.  CODE STATUS: DNR Level of care: Med-Surg  TOTAL TIME TAKING CARE OF THIS PATIENT: 35 minutes.   More than 50% of the time was spent in counseling/coordination of care: YES  POSSIBLE D/C IN 1-2 DAYS, DEPENDING ON CLINICAL CONDITION.   Max Sane M.D on 10/14/2020 at 2:03 PM  [facility]    CC: Primary care physician; [name], MD  Note: This dictation was prepared with Dragon dictation along with smaller phrase technology. Any transcriptional errors that result from this process are unintentional.

## 2020-10-14 NOTE — Progress Notes (Signed)
Assumed care of patient at 1100. Madlyn Frankel, RN

## 2020-10-14 NOTE — TOC Progression Note (Signed)
Transition of Care [facility]) - Progression Note    Patient Details  Name: Carl [name] MRN: [id number] Date of Birth: 05-10-1951  [facility] of [facility]) CM/SW Contact  Truitt Merle, LCSW Phone Number: 10/14/2020, 3:07 PM  Clinical Narrative:    FL-2 completed and sent out via hub to local SNFs. Dr. Manuella Ghazi updated. TOC continuing to follow.       Expected Discharge Plan and Services                                                 Social Determinants of Health (SDOH) Interventions    Readmission Risk Interventions Readmission Risk Prevention Plan 03/05/2019 12/31/2018  Transportation Screening Complete Complete  PCP or Specialist Appt within 3-5 Days Complete Complete  HRI or [place] Complete Complete  Social Work Consult for [place] Planning/Counseling Complete Complete  Palliative Care Screening Complete Not Applicable  Medication Review [employer] photographer) Complete Complete  Some recent data might be hidden

## 2020-10-14 NOTE — Progress Notes (Signed)
Physical Therapy Treatment Patient Details Name: Carl [name] MRN: [id number] DOB: 1951/05/31 Today's Date: 10/14/2020    History of Present Illness Carl [name] is a 70 y.o. male with medical history significant for chronic systolic heart failure with most recent echocardiogram showing LVEF 20% status post AICD placement, paroxysmal atrial fibrillation chronically anticoagulated on Eliquis, stage IV chronic kidney disease with baseline creatinine 2.2-2.7, chronic blood loss anemia with baseline hemoglobin of 7-9, cirrhosis.  He came in 2/14, noted to be in shock, bradycardic, hypothermic, profoundly anemic and an acute kidney injury.  Critical care was consulted for further evaluation, requiring vasopressors.    PT Comments    Pt ready for session.  To EOB with mod a x 1.  Supervision in sitting.  Stood with min a x 1 and elevated bed with feet blocked to prevent sliding.  Standing ex including marching and B SLR with min a x 1 and walker support before transfer to recliner with min a x 1.  Once rested, he is able to stand with min a x 1 and walk 8' in room before fatigue.  Remained in recliner with RN to attend to leaking IV site.     Follow Up Recommendations  SNF;Supervision for mobility/OOB     Equipment Recommendations  None recommended by PT    Recommendations for Other Services       Precautions / Restrictions Precautions Precautions: Fall Restrictions Weight Bearing Restrictions: No    Mobility  Bed Mobility Overal bed mobility: Needs Assistance Bed Mobility: Supine to Sit     Supine to sit: Min assist;HOB elevated          Transfers Overall transfer level: Needs assistance Equipment used: Rolling walker (2 wheeled) Transfers: Sit to/from Stand Sit to Stand: Min assist;From elevated surface         General transfer comment: feet blocked, balance improved  Ambulation/Gait Ambulation/Gait assistance: Min assist Gait Distance (Feet): 8 Feet Assistive  device: Rolling walker (2 wheeled) Gait Pattern/deviations: Step-through pattern;Decreased step length - right;Decreased step length - left;Narrow base of support Gait velocity: decreased   General Gait Details: short shuffling steps, hands on assist at all times needed,fatigues easily   Stairs             Wheelchair Mobility    Modified Rankin (Stroke Patients Only)       Balance Overall balance assessment: Needs assistance Sitting-balance support: Bilateral upper extremity supported;Feet supported Sitting balance-Leahy Scale: Fair Sitting balance - Comments: able to sit with supervision today   Standing balance support: Bilateral upper extremity supported Standing balance-Leahy Scale: Poor Standing balance comment: hands on assist at all times but overall improving                            Cognition Arousal/Alertness: Awake/alert Behavior During Therapy: WFL for tasks assessed/performed;Flat affect Overall Cognitive Status: History of cognitive impairments - at baseline                                 General Comments: HOH,      Exercises      General Comments        Pertinent Vitals/Pain Pain Assessment: Faces Faces Pain Scale: Hurts little more Pain Location: Back - family reported fall off porch a few weeks ago - "nothing is broken" Pain Descriptors / Indicators: Discomfort;Dull;Grimacing Pain Intervention(s): Limited activity within patient's  tolerance;Monitored during session;Repositioned    Home Living                      Prior Function            PT Goals (current goals can now be found in the care plan section) Progress towards PT goals: Progressing toward goals    Frequency    Min 2X/week      PT Plan Current plan remains appropriate    Co-evaluation              AM-PAC PT "6 Clicks" Mobility   Outcome Measure  Help needed turning from your back to your side while in a flat bed  without using bedrails?: A Lot Help needed moving from lying on your back to sitting on the side of a flat bed without using bedrails?: A Lot Help needed moving to and from a bed to a chair (including a wheelchair)?: A Little Help needed standing up from a chair using your arms (e.g., wheelchair or bedside chair)?: A Little Help needed to walk in hospital room?: A Little Help needed climbing 3-5 steps with a railing? : A Lot 6 Click Score: 15    End of Session Equipment Utilized During Treatment: Gait belt Activity Tolerance: Patient tolerated treatment well;Patient limited by fatigue Patient left: with call bell/phone within reach;in chair;with nursing/sitter in room Nurse Communication: Mobility status PT Visit Diagnosis: Muscle weakness (generalized) (M62.81);Difficulty in walking, not elsewhere classified (R26.2);Unsteadiness on feet (R26.81)     Time: 3428-7681 PT Time Calculation (min) (ACUTE ONLY): 19 min  Charges:  $Therapeutic Activity: 8-22 mins                    Chesley Noon, PTA 10/14/20, 11:00 AM

## 2020-10-14 NOTE — NC FL2 (Signed)
[place] LEVEL OF CARE SCREENING TOOL     IDENTIFICATION  Patient Name: Carl [name] Birthdate: 02-Jan-1951 Sex: male Admission Date (Current Location): 10/08/2020  [place] and [place] Number:  Carl [name] [id number] [address] and Address:  [facility], [address] [place], [place] [address]      Provider Number: [id number]  Attending Physician Name and Address:  Max Sane, MD  Relative Name and Phone Number:  Carl [name] [facility])   [phone]    Current Level of Care: Hospital Recommended Level of Care: [place] Prior Approval Number:    Date Approved/Denied:   PASRR Number:    Discharge Plan: SNF    Current Diagnoses: Patient Active Problem List   Diagnosis Date Noted  . HFrEF (heart failure with reduced ejection fraction) ([place])   . Paroxysmal atrial fibrillation (HCC)   . Shock circulatory ([place]) 10/08/2020  . Acute kidney injury superimposed on CKD ([place]) 08/03/2020  . Leukocytosis 08/03/2020  . Acute on chronic blood loss anemia 08/02/2020  . Pressure injury of skin 06/29/2020  . Hyponatremia 06/27/2020  . Acute GI bleeding 06/27/2020  . 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 Persistent atrial fibrillation ([place]) 11/30/2019  . Secondary hypercoagulable state ([place]) 11/30/2019  . Arm DVT (deep venous thromboembolism), acute, left ([place]) 11/26/2019  . Hx of adenomatous colonic polyps 05/18/2019  . Cirrhosis ([place]) 05/12/2019  . Occult blood positive stool 05/12/2019  . HCAP (healthcare-associated pneumonia) 03/04/2019  . Hallucinations 02/10/2019  . Elevated blood sugar 01/27/2019  . S/P mitral valve repair 12/31/2018  . Bilateral pulmonary contusion 10/17/2018  . Cardiac LV ejection fraction of 20-34% 10/17/2018  . Chronic systolic CHF (congestive heart failure) ([place]) 10/17/2018  . History of ischemic cardiomyopathy 10/17/2018  . ICD (implantable cardioverter-defibrillator) in place 10/17/2018  .  S/p left hip fracture 10/17/2018  . Traumatic retroperitoneal hematoma 10/17/2018  . Age-related osteoporosis with current pathological fracture with routine healing 07/28/2018  . CAD (coronary artery disease) 07/21/2018  . GERD (gastroesophageal reflux disease) 07/21/2018  . Depression 07/21/2018  . CKD (chronic kidney disease), stage III ([place]) 07/21/2018  . Iron deficiency anemia 07/21/2018  . Hypokalemia 07/21/2018  . Closed left hip fracture ([place]) 07/21/2018  . Fracture of femoral neck, left ([place]) 07/21/2018  . Lower extremity pain, bilateral 11/12/2017  . 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 Hyperlipidemia 09/01/2017  . Essential hypertension 09/01/2017  . PAD (peripheral artery disease) ([place]) 09/01/2017  . Symptomatic anemia 08/06/2017  . GAD (generalized anxiety disorder) 06/18/2017  . Prostate cancer screening 04/28/2017  . Hydrocele 04/28/2017  . Bradycardia   . Premature ventricular contraction   . Severe mitral insufficiency   . Cardiomyopathy, ischemic 04/26/2015  . Difficulty hearing 12/29/2014  . Acute on chronic systolic CHF (congestive heart failure) ([place]) 10/10/2014  . 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 Protein-calorie malnutrition, severe ([place]) 07/29/2014  . AKI (acute kidney injury) ([place]) 07/29/2014    Orientation RESPIRATION BLADDER Height & Weight     Self,Situation,Place  Normal Continent Weight: 147 lb 9.6 oz (67 kg) Height:  5\' 10"  (177.8 cm)  BEHAVIORAL SYMPTOMS/MOOD NEUROLOGICAL BOWEL NUTRITION STATUS      Continent Diet (Regular diet; thin fluids)  AMBULATORY STATUS COMMUNICATION OF NEEDS Skin   Limited Assist Verbally Normal                       Personal Care Assistance Level of Assistance  Bathing,Feeding,Dressing,Total care Bathing Assistance: Limited assistance Feeding  assistance: Limited assistance Dressing Assistance: Limited assistance Total Care Assistance: Limited assistance   Functional Limitations Info   Sight,Hearing,Speech Sight Info: Adequate Hearing Info: Adequate Speech Info: Adequate    SPECIAL CARE FACTORS FREQUENCY  PT (By licensed PT),OT (By licensed OT)     PT Frequency: 5x per week OT Frequency: 5x per week            Contractures Contractures Info: Not present    Additional Factors Info  Code Status,Allergies Code Status Info: DNR Allergies Info: Spironolactone           Current Medications (10/14/2020):  This is the current hospital active medication list Current Facility-Administered Medications  Medication Dose Route Frequency Provider Last Rate Last Admin  . 0.9 %  sodium chloride infusion  250 mL Intravenous Continuous Juanito Doom, MD   Stopped at 10/09/20 0745  . acetaminophen (TYLENOL) tablet 650 mg  650 mg Oral Q6H PRN Max Sane, MD      . amiodarone (PACERONE) tablet 200 mg  200 mg Oral Daily Simonne Maffucci B, MD   200 mg at 10/14/20 0956  . atorvastatin (LIPITOR) tablet 40 mg  40 mg Oral Daily Simonne Maffucci B, MD   40 mg at 10/14/20 0956  . Chlorhexidine Gluconate Cloth 2 % PADS 6 each  6 each Topical Daily Juanito Doom, MD   6 each at 10/13/20 0919  . docusate sodium (COLACE) capsule 100 mg  100 mg Oral BID PRN Simonne Maffucci B, MD   100 mg at 10/08/20 1742  . fludrocortisone (FLORINEF) tablet 0.2 mg  0.2 mg Oral Daily Ottie Glazier, MD   0.2 mg at 10/13/20 0920  . furosemide (LASIX) tablet 20 mg  20 mg Oral BID Max Sane, MD   20 mg at 10/14/20 0955  . HYDROcodone-acetaminophen (NORCO/VICODIN) 5-325 MG per tablet 1-2 tablet  1-2 tablet Oral Q6H PRN Bradly Bienenstock, NP   1 tablet at 10/13/20 1244  . MEDLINE mouth rinse  15 mL Mouth Rinse BID Simonne Maffucci B, MD   15 mL at 10/13/20 2210  . midodrine (PROAMATINE) tablet 10 mg  10 mg Oral TID WC Rust-Chester, Britton L, NP   10 mg at 10/14/20 0955  . morphine 2 MG/ML injection 1-2 mg  1-2 mg Intravenous Q4H PRN Darel Hong D, NP   1 mg at 10/13/20 1526  . nystatin  (MYCOSTATIN) 100000 UNIT/ML suspension 500,000 Units  5 mL Mouth/Throat QID Max Sane, MD   500,000 Units at 10/14/20 0958  . ondansetron (ZOFRAN) injection 4 mg  4 mg Intravenous Q6H PRN Simonne Maffucci B, MD      . pantoprazole (PROTONIX) EC tablet 40 mg  40 mg Oral Daily Simonne Maffucci B, MD   40 mg at 10/14/20 0957  . polyethylene glycol (MIRALAX / GLYCOLAX) packet 17 g  17 g Oral Daily PRN Simonne Maffucci B, MD      . traZODone (DESYREL) tablet 50 mg  50 mg Oral QHS PRN Bradly Bienenstock, NP   50 mg at 10/10/20 2259     Discharge Medications: Please see discharge summary for a list of discharge medications.  Relevant Imaging Results:  Relevant Lab Results:   Additional Information [id number]  Truitt Merle, LCSW

## 2020-10-15 LAB — PHOSPHORUS: Phosphorus: 3.6 mg/dL (ref 2.5–4.6)

## 2020-10-15 LAB — MAGNESIUM: Magnesium: 2 mg/dL (ref 1.7–2.4)

## 2020-10-15 MED ORDER — FUROSEMIDE 10 MG/ML IJ SOLN
40.0000 mg | Freq: Once | INTRAMUSCULAR | Status: AC
  Administered 2020-10-15: 18:00:00 40 mg via INTRAVENOUS
  Filled 2020-10-15: qty 4

## 2020-10-15 MED ORDER — MORPHINE SULFATE (PF) 2 MG/ML IV SOLN
2.0000 mg | INTRAVENOUS | Status: DC | PRN
  Administered 2020-10-15: 15:00:00 2 mg via INTRAVENOUS
  Filled 2020-10-15: qty 1

## 2020-10-15 NOTE — TOC Progression Note (Addendum)
Transition of Care [facility]) - Progression Note    Patient Details  Name: Carl [name] MRN: [id number] Date of Birth: 09-16-1950  Transition of Care [facility]) CM/SW [place], LCSW Phone Number: 10/15/2020, 10:01 AM  Clinical Narrative:    Per TOC notes, patient/family prefer [facility] or [facility]. CSW called Admissions Workers at both facilities and asked them to review referral.   CSW called [employer] and started insurance authorization for SNF. Reference #: [id number]. Faxed clinicals.   1:20- Bed offer at [facility]. Spoke with patient's brother/POA who accepted bed offer. [place] to update chosen SNF. Auth approved #: [id number]. Called Ricky at [facility] to accept bed. Audry Pili reported they have a bed once PASRR is received. CSW started PASRR and uploaded additional information.   2:30- Per MD plan for DC to SNF tomorrow. Updated Ricky at [facility].          Expected Discharge Plan and Services                                                 Social Determinants of Health (SDOH) Interventions    Readmission Risk Interventions Readmission Risk Prevention Plan 03/05/2019 12/31/2018  Transportation Screening Complete Complete  PCP or Specialist Appt within 3-5 Days Complete Complete  HRI or [place] Complete Complete  Social Work Consult for [place] Planning/Counseling Complete Complete  Palliative Care Screening Complete Not Applicable  Medication Review Press photographer) Complete Complete  Some recent data might be hidden

## 2020-10-15 NOTE — Progress Notes (Signed)
Confirmed with MD regarding visitors for patient, brother and sister allowed to alternate but limited to ONE per day. Primary RN updated family at bedside. Madlyn Frankel, RN

## 2020-10-15 NOTE — Progress Notes (Signed)
1        [place] [facility] [place] NAME: Carl [name]    MR#:  [id number]  DATE OF BIRTH:  1951/01/16  SUBJECTIVE:  CHIEF COMPLAINT:   Chief Complaint  Patient presents with  . Hypotension    50/30 blood pressure per ems , fluid around pacemaker, positive guiac,   Pleasantly confused.  No new issues.  Blood pressure much better REVIEW OF SYSTEMS:  Review of Systems  Constitutional: Positive for malaise/fatigue. Negative for diaphoresis, fever and weight loss.  HENT: Negative for ear discharge, ear pain, hearing loss, nosebleeds, sore throat and tinnitus.   Eyes: Negative for blurred vision and pain.  Respiratory: Negative for cough, hemoptysis, shortness of breath and wheezing.   Cardiovascular: Negative for chest pain, palpitations, orthopnea and leg swelling.  Gastrointestinal: Negative for abdominal pain, blood in stool, constipation, diarrhea, heartburn, nausea and vomiting.  Genitourinary: Negative for dysuria, frequency and urgency.  Musculoskeletal: Negative for back pain and myalgias.  Skin: Negative for itching and rash.  Neurological: Negative for dizziness, tingling, tremors, focal weakness, seizures, weakness and headaches.  Psychiatric/Behavioral: Positive for memory loss. Negative for depression. The patient is not nervous/anxious.    DRUG ALLERGIES:   Allergies  Allergen Reactions  . Spironolactone Other (See Comments)    gynecomastia    VITALS:  Blood pressure 115/62, pulse 71, temperature 97.8 F (36.6 C), temperature source Oral, resp. rate 16, height 5\' 10"  (1.778 m), weight 67 kg, SpO2 97 %. PHYSICAL EXAMINATION:  Physical Exam  70 year old male looking older than his stated age General: Laying in the bed pleasantly confused, chronically ill looking Mouth: Oral thrush noted on tongue Neuro: Pleasantly confused, nonfocal exam Cardiovascular S1-S2 normal Pulmonary diminished breath sounds throughout GI soft, nontender GU Foley in  place.  Penile swelling Extremities: No pedal edema LABORATORY PANEL:  Male CBC Recent Labs  Lab 10/14/20 0327  WBC 5.4  HGB 8.4*  HCT 27.0*  PLT 297   ------------------------------------------------------------------------------------------------------------------ Chemistries  Recent Labs  Lab 10/14/20 0327 10/15/20 0520  NA 135  --   K 3.9  --   CL 103  --   CO2 23  --   GLUCOSE 94  --   BUN 23  --   CREATININE 1.29*  --   CALCIUM 8.7*  --   MG 2.2 2.0   RADIOLOGY:  No results found. ASSESSMENT AND PLAN:  70 year old male with a known history of CAD/CABG, ischemic cardiomyopathy, PAF, cirrhosis, chronic AVM with history of GI bleed admitted for generalized weakness, severe anemia and altered mental status  10/09/2020 admitted for generalized weakness and altered mental status 10/10/2020- Patient has improved, he is awake and sitting up in bed, weaning off of vasopressors today, continue IVF.  Net 2.5L positive fluid balance, will initiate PT/OT in next 12-24h . Goals of care meeting with family 10/10/2020 10/11/20- patient seems to be more stable today.  He is being medically optimized for dc to SNF with palliative 10/12/2020-weaning norepinephrine off 10/13/2020 -transfer to [facility] and to the floor 2/20 -waiting for SNF placement  Hypovolemic shock Present on admission and initially required pressors which are weaned off, now resolved  Acute on chronic anemia Chronic GI bleed, no evidence of acute bleeding Admission hemoglobin of 6.6 -not a candidate for anticoagulation Conservative management per GI.  No plan for endoscopy.  Holding anticoagulation and continuing Protonix  Acute kidney injury Likely prerenal with hypovolemia. Creatinine stable and improved  Adult failure to thrive Oral  thrush - nystatin swish and swallow Family requesting SNF with palliative care to follow at discharge  Acute metabolic encephalopathy present on admission Due to hypovolemic  shock, anemia He seems close to his baseline mental status now  Cirrhosis without evidence of ascites Holding diuretics due to acute kidney injury  Severe ischemic cardiomyopathy Coronary artery disease/CABG Severe mitral valve regurgitation status post mitral valve clip EF of 20 to 25% Considering his net +2.7 L -we will switch him to intravenous 40 mg Lasix one-time and discontinue oral Lasix Net IO Since Admission: 2,700.68 mL [10/15/20 1524]  Paroxysmal A. Fib and history of junctional bradycardia Maintaining normal sinus rhythm now Not a candidate for anticoagulation due to GI bleed  Paraphimosis Status post Foley removal.  Supportive care for edema per urology  Family would like for him to be placed at SNF and have palliative care follow, they do want to keep him comfortable  Body mass index is 21.18 kg/m.  Net IO Since Admission: 2,700.68 mL [10/15/20 1524]   Status is: Inpatient  Remains inpatient appropriate because:Hemodynamically unstable   Dispo: The patient is from: Home              Anticipated d/c is to: SNF/[facility]              Anticipated d/c date is: 1 day              Patient currently is not medically stable to d/c.   Difficult to place patient No   DVT prophylaxis:       SCDs Start: 10/08/20 1652     Family Communication: Updated brother [name] over phone on 2/21   All the records are reviewed and case discussed with Care Management/Social Worker. Management plans discussed with the patient, family/brother and they are in agreement.  CODE STATUS: DNR Level of care: Med-Surg  TOTAL TIME TAKING CARE OF THIS PATIENT: 35 minutes.   More than 50% of the time was spent in counseling/coordination of care: YES  POSSIBLE D/C IN 1 DAYS, DEPENDING ON CLINICAL CONDITION.   Max Sane M.D on 10/15/2020 at 3:24 PM  [facility]   CC: Primary care physician; [name], MD  Note: This dictation was prepared with Dragon dictation along  with smaller phrase technology. Any transcriptional errors that result from this process are unintentional.

## 2020-10-15 NOTE — Progress Notes (Signed)
Patient moved to room closer to main nurses' station for safety.

## 2020-10-15 NOTE — Progress Notes (Signed)
RE: Carl [name] Date of Birth: October 27, 1950 Date: 10/15/2020   To Whom It May Concern:  Please be advised that the above-named patient will require a short-term nursing home stay - anticipated 30 days or less for rehabilitation and strengthening.  The plan is for return home.

## 2020-10-15 NOTE — Progress Notes (Signed)
Patient noted to have blanchable erythema to sacral region. Barrier cream applied after each episode of incontinence.

## 2020-10-16 ENCOUNTER — Other Ambulatory Visit: Payer: Self-pay

## 2020-10-16 ENCOUNTER — Telehealth: Payer: Self-pay

## 2020-10-16 DIAGNOSIS — D509 Iron deficiency anemia, unspecified: Secondary | ICD-10-CM

## 2020-10-16 LAB — PHOSPHORUS: Phosphorus: 3.4 mg/dL (ref 2.5–4.6)

## 2020-10-16 LAB — RESP PANEL BY RT-PCR (FLU A&B, COVID) ARPGX2
Influenza A by PCR: NEGATIVE
Influenza B by PCR: NEGATIVE
SARS Coronavirus 2 by RT PCR: NEGATIVE

## 2020-10-16 LAB — MAGNESIUM: Magnesium: 2.1 mg/dL (ref 1.7–2.4)

## 2020-10-16 NOTE — Telephone Encounter (Signed)
FYI... Appts were R/S However I wasn't able to reach Carl [name] to make her aware I'll give her a call back on 10/14/2020 to make sure the sched dates and times are ok

## 2020-10-16 NOTE — Progress Notes (Signed)
Physical Therapy Treatment Patient Details Name: Carl [name] MRN: [id number] DOB: [birth year] Today's Date: 10/16/2020    History of Present Illness Carl [name] is a 70 y.o. male with medical history significant for chronic systolic heart failure with most recent echocardiogram showing LVEF 20% status post AICD placement, paroxysmal atrial fibrillation chronically anticoagulated on Eliquis, stage IV chronic kidney disease with baseline creatinine 2.2-2.7, chronic blood loss anemia with baseline hemoglobin of 7-9, cirrhosis.  He came in 2/14, noted to be in shock, bradycardic, hypothermic, profoundly anemic and an acute kidney injury.  Critical care was consulted for further evaluation, requiring vasopressors.    PT Comments    Pt tolerated treatment well today, but was limited overall due to c/o fatigue. With encouragement and mod assist, pt able to perform supine>sit transfer to EOB. Pt able to complete LAQ, marching, and isometric hip ADD with max multimodal cues at EOB for ~13min. Pt requested return to supine due to c/o fatigue. Max assist required for sit>supine transfer, and total assist +2 for posterior scooting towards HOB. Bed placed in chair position with BLE elevated on pillow. Pt will continue to benefit from skilled acute PT services to address deficits for return to baseline function. Will continue to recommend SNF at DC.    Follow Up Recommendations  SNF;Supervision for mobility/OOB     Equipment Recommendations  None recommended by PT    Recommendations for Other Services       Precautions / Restrictions Precautions Precautions: Fall Restrictions Weight Bearing Restrictions: No    Mobility  Bed Mobility Overal bed mobility: Needs Assistance Bed Mobility: Supine to Sit;Sit to Supine     Supine to sit: Mod assist Sit to supine: Max assist   General bed mobility comments: Mod-Max for supine<>sit transfer with HOB elevated due to fatigue; multimodal cues for  sequencing    Transfers                 General transfer comment: deferred due to fatigue  Ambulation/Gait                  Balance Overall balance assessment: Needs assistance Sitting-balance support: Bilateral upper extremity supported;Feet supported Sitting balance-Leahy Scale: Fair Sitting balance - Comments: Supervision-CGA for safety. Pt able to perform BLE therex at EOB for ~35min                                    Cognition Arousal/Alertness: Lethargic Behavior During Therapy: [facility] for tasks assessed/performed;Flat affect Overall Cognitive Status: History of cognitive impairments - at baseline                                 General Comments: HOH,      Exercises Other Exercises Other Exercises: Pt able to participate in bed mobility and therex at bedside today. Pt able to tolerate ~51min of seated balance at EOB performing LAQ, marching, and isometric hip ADD. Max cueing for participation and completion of therex. Limited session due to c/o fatigue. Other Exercises: Pt educated regarding: PT role/POC and benefits of participation with therapy for progress towards goals.    General Comments        Pertinent Vitals/Pain Pain Assessment: No/denies pain           PT Goals (current goals can now be found in the care plan section) Acute Rehab  PT Goals Patient Stated Goal: get well enough to go home PT Goal Formulation: With patient Time For Goal Achievement: 10/24/20 Potential to Achieve Goals: Fair Progress towards PT goals: Progressing toward goals    Frequency    Min 2X/week      PT Plan Current plan remains appropriate    AM-PAC PT "6 Clicks" Mobility   Outcome Measure  Help needed turning from your back to your side while in a flat bed without using bedrails?: A Lot Help needed moving from lying on your back to sitting on the side of a flat bed without using bedrails?: A Lot Help needed moving to and from a  bed to a chair (including a wheelchair)?: A Little Help needed standing up from a chair using your arms (e.g., wheelchair or bedside chair)?: A Little Help needed to walk in hospital room?: A Little Help needed climbing 3-5 steps with a railing? : A Lot 6 Click Score: 15    End of Session   Activity Tolerance: Patient tolerated treatment well;Patient limited by fatigue Patient left: with call bell/phone within reach;in bed;with bed alarm set   PT Visit Diagnosis: Muscle weakness (generalized) (M62.81);Difficulty in walking, not elsewhere classified (R26.2);Unsteadiness on feet (R26.81)     Time: 8177-1165 PT Time Calculation (min) (ACUTE ONLY): 11 min  Charges:  $Therapeutic Exercise: 8-22 mins                     Herminio Commons, PT, DPT 9:52 AM,10/16/20

## 2020-10-16 NOTE — Progress Notes (Signed)
Report given to [facility] at [facility], waiting for EMS to transport pt

## 2020-10-16 NOTE — TOC Progression Note (Signed)
Transition of Care [facility]) - Progression Note    Patient Details  Name: Carl [name] MRN: [id number] Date of Birth: 12-30-1950  Transition of Care [facility]) CM/SW Contact  Shelbie Hutching, RN Phone Number: 10/16/2020, 10:53 AM  Clinical Narrative:    [facility] resubmitted this morning it was canceled by [facility] yesterday afternoon.  [facility] under manuel review.  Plan is for [facility] today if [facility] comes back.  RNCM asked MD to order rapid COVID.    Expected Discharge Plan: Skilled Nursing Facility Barriers to Discharge: Other (comment) ([facility] pending)  Expected Discharge Plan and Services Expected Discharge Plan: [place]   Discharge Planning Services: CM Consult Post Acute Care Choice: [place]   Expected Discharge Date: 10/16/20               DME Arranged: N/A         HH Arranged: NA           Social Determinants of Health (SDOH) Interventions    Readmission Risk Interventions Readmission Risk Prevention Plan 03/05/2019 12/31/2018  Transportation Screening Complete Complete  PCP or Specialist Appt within 3-5 Days Complete Complete  HRI or [place] Complete Complete  Social Work Consult for [place] Planning/Counseling Complete Complete  Palliative Care Screening Complete Not Applicable  Medication Review Press photographer) Complete Complete  Some recent data might be hidden

## 2020-10-16 NOTE — Care Management Important Message (Signed)
Important Message  Patient Details  Name: COLA HIGHFILL MRN: [id number] Date of Birth: 08/15/1951   Medicare Important Message Given:  Yes     Juliann Pulse A Luree Palla 10/16/2020, 10:31 AM

## 2020-10-16 NOTE — Discharge Summary (Signed)
[facility] NAME: Carl [name]    MR#:  [id number]  DATE OF BIRTH:  22-Nov-1950  DATE OF ADMISSION:  10/08/2020   ADMITTING PHYSICIAN: Juanito Doom, MD  DATE OF DISCHARGE: 10/16/2020  PRIMARY CARE PHYSICIAN: [name], MD   ADMISSION DIAGNOSIS:  Hypovolemic shock ([place]) [R57.1] Hyperkalemia [E87.5] Bradycardia [R00.1] Shock circulatory ([place]) [R57.9] Acute GI bleeding [K92.2] Dehydration, severe [E86.0] AKI (acute kidney injury) ([place]) [N17.9] DISCHARGE DIAGNOSIS:  Active Problems:   Acute GI bleeding   Shock circulatory (HCC)   HFrEF (heart failure with reduced ejection fraction) (HCC)   Paroxysmal atrial fibrillation ([place])  SECONDARY DIAGNOSIS:   Past Medical History:  Diagnosis Date  . AICD (automatic cardioverter/defibrillator) present   . Anemia   . Anxiety   . Arthritis   . Brunner's gland hyperplasia of duodenum   . CHF (congestive heart failure) ([place])   . Chronic kidney disease   . Cirrhosis of liver ([place])   . Coronary artery disease   . Depression   . Dysrhythmia    atrial fib  . External hemorrhoids   . Fracture of femoral neck, left ([place]) 07/21/2018  . GERD (gastroesophageal reflux disease)   . Gout   . Hearing loss   . Heart murmur    mirtal valve insufficiency  . Hepatic failure ([place])   . HH (hiatus hernia)   . Hyperlipidemia   . Hypokalemia   . Hyposmolality   . Intellectual disability    mild  . Internal hemorrhoids   . Ischemic cardiomyopathy   . Leg fracture, left   . Myocardial infarction ([place])   . Osteoporosis   . Paronychia   . Peripheral vascular disease ([place])   . Pneumonia   . Psoriasis   . Psoriasis   . PUD (peptic ulcer disease)   . Schatzki's ring   . Tubular adenoma of colon   . Vitiligo    HOSPITAL COURSE:  70 year old male with a known history of CAD/CABG, ischemic cardiomyopathy, PAF, cirrhosis, chronic AVM with history of GI bleed admitted for generalized  weakness, severe anemia and altered mental status  Hypovolemic shock Present on admission and initially required pressors which are weaned off, now resolved  Acute on chronic anemia Chronic GI bleed, no evidence of acute bleeding Admission hemoglobin of 6.6 -not a candidate for anticoagulation Conservative management per GI.  No plan for endoscopy.  No anticoagulation and continuing Protonix  Acute kidney injury Likely prerenal with hypovolemia. Creatinine stable and improved  Adult failure to thrive Oral thrush -Resolved with nystatin swish and swallow Family requesting SNF with palliative care to follow at discharge  Acute metabolic encephalopathy present on admission Due to hypovolemic shock, anemia He seems close to his baseline mental status now  Cirrhosis without evidence of ascites Holding diuretics due to acute kidney injury  Severe ischemic cardiomyopathy Coronary artery disease/CABG Severe mitral valve regurgitation status post mitral valve clip EF of 20 to 25% Net IO Since Admission: 2,710.68 mL [10/16/20 1143]  Paroxysmal A. Fib and history of junctional bradycardia Maintaining normal sinus rhythm now Not a candidate for anticoagulation due to GI bleed  Paraphimosis Status post Foley removal.  Supportive care for edema per urology.  DISCHARGE CONDITIONS:  Stable CONSULTS OBTAINED:  Treatment Team:  Hollice Espy, MD DRUG ALLERGIES:   Allergies  Allergen Reactions  . Spironolactone Other (See Comments)    gynecomastia    DISCHARGE MEDICATIONS:  Allergies as of 10/16/2020      Reactions   Spironolactone Other (See Comments)   gynecomastia       Medication List    STOP taking these medications   acetaminophen 500 MG tablet Commonly known as: TYLENOL   apixaban 2.5 MG Tabs tablet Commonly known as: Eliquis   atorvastatin 40 MG tablet Commonly known as: LIPITOR   carvedilol 3.125 MG tablet Commonly known as: COREG    Cholecalciferol 50 MCG (2000 UT) Tabs   docusate sodium 100 MG capsule Commonly known as: COLACE   lactulose 10 GM/15ML solution Commonly known as: CHRONULAC   metolazone 2.5 MG tablet Commonly known as: ZAROXOLYN   multivitamin tablet   potassium chloride SA 20 MEQ tablet Commonly known as: Klor-Con M20   torsemide 20 MG tablet Commonly known as: DEMADEX   Vitamin B 12 500 MCG Tabs   vitamin B-12 500 MCG tablet Commonly known as: CYANOCOBALAMIN   vitamin C 100 MG tablet     TAKE these medications   allopurinol 100 MG tablet Commonly known as: ZYLOPRIM Take 100 mg by mouth daily.   amiodarone 200 MG tablet Commonly known as: PACERONE Take 1 tablet (200 mg total) by mouth daily.   DULoxetine 30 MG capsule Commonly known as: CYMBALTA Take 30 mg by mouth at bedtime.   Ensure Complete Shake Liqd Take 1 Can by mouth 3 (three) times daily. Between meals   ferrous sulfate 325 (65 FE) MG tablet Take 1 tablet (325 mg total) by mouth daily with breakfast.   gabapentin 300 MG capsule Commonly known as: NEURONTIN Take 300 mg by mouth daily.   ketoconazole 2 % shampoo Commonly known as: NIZORAL Apply 1 application topically 2 (two) times a week. Mon and Thu   pantoprazole 40 MG tablet Commonly known as: PROTONIX TAKE 1 TABLET BY MOUTH EVERY DAY   sucralfate 1 g tablet Commonly known as: CARAFATE Take 1 tablet (1 g total) by mouth 2 (two) times daily.      DISCHARGE INSTRUCTIONS:   DIET:  Regular diet DISCHARGE CONDITION:  Stable ACTIVITY:  Activity as tolerated OXYGEN:  Home Oxygen: No.  Oxygen Delivery: room air DISCHARGE LOCATION:  nursing home/[facility] with palliative care to follow and transition to hospice if and when he qualifies  If you experience worsening of your admission symptoms, develop shortness of breath, life threatening emergency, suicidal or homicidal thoughts you must seek medical attention immediately by calling 911 or calling your  MD immediately  if symptoms less severe.  You Must read complete instructions/literature along with all the possible adverse reactions/side effects for all the Medicines you take and that have been prescribed to you. Take any new Medicines after you have completely understood and accpet all the possible adverse reactions/side effects.   Please note  You were cared for by a hospitalist during your hospital stay. If you have any questions about your discharge medications or the care you received while you were in the hospital after you are discharged, you can call the unit and asked to speak with the hospitalist on call if the hospitalist that took care of you is not available. Once you are discharged, your primary care physician will handle any further medical issues. Please note that NO REFILLS for any discharge medications will be authorized once you are discharged, as it is imperative that you return to your primary care physician (or establish a relationship with a primary care physician if you do not have one) for your  aftercare needs so that they can reassess your need for medications and monitor your lab values.    On the day of Discharge:  VITAL SIGNS:  Blood pressure 120/67, pulse 76, temperature 97.7 F (36.5 C), temperature source Oral, resp. rate 16, height 5\' 10"  (1.778 m), weight 67 kg, SpO2 97 %. PHYSICAL EXAMINATION:  GENERAL:  70 y.o.-year-old patient lying in the bed with no acute distress.  EYES: Pupils equal, round, reactive to light and accommodation. No scleral icterus. Extraocular muscles intact.  HEENT: Head atraumatic, normocephalic. Oropharynx and nasopharynx clear.  NECK:  Supple, no jugular venous distention. No thyroid enlargement, no tenderness.  LUNGS: Normal breath sounds bilaterally, no wheezing, rales,rhonchi or crepitation. No use of accessory muscles of respiration.  CARDIOVASCULAR: S1, S2 normal. No murmurs, rubs, or gallops.  ABDOMEN: Soft, non-tender,  non-distended. Bowel sounds present. No organomegaly or mass.  EXTREMITIES: No pedal edema, cyanosis, or clubbing.  NEUROLOGIC: Cranial nerves II through XII are intact. Muscle strength 5/5 in all extremities. Sensation intact. Gait not checked.  PSYCHIATRIC: The patient is alert and oriented x 3.  SKIN: No obvious rash, lesion, or ulcer.  DATA REVIEW:   CBC Recent Labs  Lab 10/14/20 0327  WBC 5.4  HGB 8.4*  HCT 27.0*  PLT 297    Chemistries  Recent Labs  Lab 10/14/20 0327 10/15/20 0520 10/16/20 0538  NA 135  --   --   K 3.9  --   --   CL 103  --   --   CO2 23  --   --   GLUCOSE 94  --   --   BUN 23  --   --   CREATININE 1.29*  --   --   CALCIUM 8.7*  --   --   MG 2.2   < > 2.1   < > = values in this interval not displayed.     Outpatient follow-up  Follow-up Information    [name], MD. Schedule an appointment as soon as possible for a visit in 1 week.   Specialty: Internal Medicine Why: SNF will set up appt Contact information: [place] [address] [phone]               30 Day Unplanned Readmission Risk Score   Flowsheet Row ED to Hosp-Admission (Current) from 10/08/2020 in [facility] (1C)  30 Day Unplanned Readmission Risk Score (%) 27.09 Filed at 10/16/2020 0801     This score is the patient's risk of an unplanned readmission within 30 days of being discharged (0 -100%). The score is based on dignosis, age, lab data, medications, orders, and past utilization.   Low:  0-14.9   Medium: 15-21.9   High: 22-29.9   Extreme: 30 and above         Management plans discussed with the patient, family and they are in agreement.  CODE STATUS: DNR   TOTAL TIME TAKING CARE OF THIS PATIENT: Forty-five minutes.    Max Sane M.D on 10/16/2020 at 11:41 AM  [facility]   CC: Primary care physician; [name], MD   Note: This dictation was prepared with Dragon dictation  along with smaller phrase technology. Any transcriptional errors that result from this process are unintentional.

## 2020-10-16 NOTE — TOC Transition Note (Signed)
Transition of Care [facility]) - CM/SW Discharge Note   Patient Details  Name: Carl [name] MRN: [id number] Date of Birth: Oct 22, 1950  [facility] CM/SW Contact:  Shelbie Hutching, RN Phone Number: 10/16/2020, 12:33 PM   Clinical Narrative:    Patient medically cleared for discharge to [facility].  Patient is going to room D 13.  Passr completed [id number] E- good 10/16/20-11/15/20.  RN will call report to [phone].  RNCM will arrange EMS transport.    Final next level of care: Skilled Nursing Facility Barriers to Discharge: Barriers Resolved   Patient Goals and CMS Choice Patient states their goals for this hospitalization and ongoing recovery are:: Rehab CMS Medicare.gov Compare Post Acute Care list provided to:: Patient Represenative (must comment) Choice offered to / list presented to : Adult Children,Sibling  Discharge Placement PASRR number recieved: 10/16/20            Patient chooses bed at: [facility] Patient to be transferred to facility by: [facility] Name of family member notified: Ella Guillotte- brother Patient and family notified of of transfer: 10/16/20  Discharge Plan and Services   Discharge Planning Services: CM Consult Post Acute Care Choice: [facility]          DME Arranged: N/A         HH Arranged: NA          Social Determinants of Health (SDOH) Interventions     Readmission Risk Interventions Readmission Risk Prevention Plan 03/05/2019 12/31/2018  Transportation Screening Complete Complete  PCP or Specialist Appt within 3-5 Days Complete Complete  HRI or Home Care Consult Complete Complete  Social Work Consult for [place] Planning/Counseling Complete Complete  Palliative Care Screening Complete Not Applicable  Medication Review Press photographer) Complete Complete  Some recent data might be hidden

## 2020-10-16 NOTE — Telephone Encounter (Signed)
Received call from [place] at [facility] in [place] stating that patient will not be able to come for lab/Retacrit tomorrow. She is ok with him getting r/s to next week.   Please r/s lab/Retacrit to one day next week and move appts on 3/22 & 3/23 (lab/MD/ venofer or retacrit) out 1 week so that injections are at least 4 weeks apart. Please call Mateo Flow @ [phone] to inform her of new appts details. She will set up facility transportation.

## 2020-10-17 ENCOUNTER — Inpatient Hospital Stay
Admission: EM | Admit: 2020-10-17 | Discharge: 2020-10-23 | DRG: 291 | Disposition: E | Payer: Medicare Other | Attending: Internal Medicine | Admitting: Internal Medicine

## 2020-10-17 ENCOUNTER — Inpatient Hospital Stay: Payer: Medicare Other

## 2020-10-17 ENCOUNTER — Emergency Department: Payer: Medicare Other

## 2020-10-17 ENCOUNTER — Other Ambulatory Visit: Payer: Self-pay

## 2020-10-17 DIAGNOSIS — E785 Hyperlipidemia, unspecified: Secondary | ICD-10-CM | POA: Diagnosis present

## 2020-10-17 DIAGNOSIS — I959 Hypotension, unspecified: Secondary | ICD-10-CM | POA: Diagnosis present

## 2020-10-17 DIAGNOSIS — Z8249 Family history of ischemic heart disease and other diseases of the circulatory system: Secondary | ICD-10-CM

## 2020-10-17 DIAGNOSIS — E43 Unspecified severe protein-calorie malnutrition: Secondary | ICD-10-CM | POA: Diagnosis present

## 2020-10-17 DIAGNOSIS — A419 Sepsis, unspecified organism: Secondary | ICD-10-CM | POA: Diagnosis present

## 2020-10-17 DIAGNOSIS — D649 Anemia, unspecified: Secondary | ICD-10-CM | POA: Diagnosis not present

## 2020-10-17 DIAGNOSIS — M1990 Unspecified osteoarthritis, unspecified site: Secondary | ICD-10-CM | POA: Diagnosis present

## 2020-10-17 DIAGNOSIS — R64 Cachexia: Secondary | ICD-10-CM | POA: Diagnosis present

## 2020-10-17 DIAGNOSIS — K7460 Unspecified cirrhosis of liver: Secondary | ICD-10-CM | POA: Diagnosis present

## 2020-10-17 DIAGNOSIS — D631 Anemia in chronic kidney disease: Secondary | ICD-10-CM | POA: Diagnosis present

## 2020-10-17 DIAGNOSIS — I130 Hypertensive heart and chronic kidney disease with heart failure and stage 1 through stage 4 chronic kidney disease, or unspecified chronic kidney disease: Principal | ICD-10-CM | POA: Diagnosis present

## 2020-10-17 DIAGNOSIS — R54 Age-related physical debility: Secondary | ICD-10-CM | POA: Diagnosis present

## 2020-10-17 DIAGNOSIS — I252 Old myocardial infarction: Secondary | ICD-10-CM

## 2020-10-17 DIAGNOSIS — Z7189 Other specified counseling: Secondary | ICD-10-CM | POA: Diagnosis not present

## 2020-10-17 DIAGNOSIS — I25118 Atherosclerotic heart disease of native coronary artery with other forms of angina pectoris: Secondary | ICD-10-CM | POA: Diagnosis present

## 2020-10-17 DIAGNOSIS — N1832 Chronic kidney disease, stage 3b: Secondary | ICD-10-CM | POA: Diagnosis present

## 2020-10-17 DIAGNOSIS — I10 Essential (primary) hypertension: Secondary | ICD-10-CM | POA: Diagnosis not present

## 2020-10-17 DIAGNOSIS — Z66 Do not resuscitate: Secondary | ICD-10-CM | POA: Diagnosis present

## 2020-10-17 DIAGNOSIS — I739 Peripheral vascular disease, unspecified: Secondary | ICD-10-CM | POA: Diagnosis present

## 2020-10-17 DIAGNOSIS — E871 Hypo-osmolality and hyponatremia: Secondary | ICD-10-CM | POA: Diagnosis present

## 2020-10-17 DIAGNOSIS — Z515 Encounter for palliative care: Secondary | ICD-10-CM

## 2020-10-17 DIAGNOSIS — I5022 Chronic systolic (congestive) heart failure: Secondary | ICD-10-CM | POA: Diagnosis present

## 2020-10-17 DIAGNOSIS — G9341 Metabolic encephalopathy: Secondary | ICD-10-CM | POA: Diagnosis present

## 2020-10-17 DIAGNOSIS — I5043 Acute on chronic combined systolic (congestive) and diastolic (congestive) heart failure: Secondary | ICD-10-CM | POA: Diagnosis present

## 2020-10-17 DIAGNOSIS — Z20822 Contact with and (suspected) exposure to covid-19: Secondary | ICD-10-CM | POA: Diagnosis present

## 2020-10-17 DIAGNOSIS — Z681 Body mass index (BMI) 19 or less, adult: Secondary | ICD-10-CM

## 2020-10-17 DIAGNOSIS — Z87891 Personal history of nicotine dependence: Secondary | ICD-10-CM

## 2020-10-17 DIAGNOSIS — R778 Other specified abnormalities of plasma proteins: Secondary | ICD-10-CM | POA: Diagnosis present

## 2020-10-17 DIAGNOSIS — I472 Ventricular tachycardia: Secondary | ICD-10-CM | POA: Diagnosis not present

## 2020-10-17 DIAGNOSIS — D500 Iron deficiency anemia secondary to blood loss (chronic): Secondary | ICD-10-CM | POA: Diagnosis present

## 2020-10-17 DIAGNOSIS — I2510 Atherosclerotic heart disease of native coronary artery without angina pectoris: Secondary | ICD-10-CM | POA: Diagnosis present

## 2020-10-17 DIAGNOSIS — F419 Anxiety disorder, unspecified: Secondary | ICD-10-CM | POA: Diagnosis present

## 2020-10-17 DIAGNOSIS — N179 Acute kidney failure, unspecified: Secondary | ICD-10-CM | POA: Diagnosis present

## 2020-10-17 DIAGNOSIS — M810 Age-related osteoporosis without current pathological fracture: Secondary | ICD-10-CM | POA: Diagnosis present

## 2020-10-17 DIAGNOSIS — R627 Adult failure to thrive: Secondary | ICD-10-CM | POA: Diagnosis present

## 2020-10-17 DIAGNOSIS — F70 Mild intellectual disabilities: Secondary | ICD-10-CM | POA: Diagnosis present

## 2020-10-17 DIAGNOSIS — J90 Pleural effusion, not elsewhere classified: Secondary | ICD-10-CM | POA: Diagnosis not present

## 2020-10-17 DIAGNOSIS — I5023 Acute on chronic systolic (congestive) heart failure: Secondary | ICD-10-CM | POA: Diagnosis not present

## 2020-10-17 DIAGNOSIS — J810 Acute pulmonary edema: Secondary | ICD-10-CM | POA: Diagnosis not present

## 2020-10-17 DIAGNOSIS — Z9889 Other specified postprocedural states: Secondary | ICD-10-CM

## 2020-10-17 DIAGNOSIS — M109 Gout, unspecified: Secondary | ICD-10-CM | POA: Diagnosis present

## 2020-10-17 DIAGNOSIS — Z951 Presence of aortocoronary bypass graft: Secondary | ICD-10-CM

## 2020-10-17 DIAGNOSIS — Z86718 Personal history of other venous thrombosis and embolism: Secondary | ICD-10-CM

## 2020-10-17 DIAGNOSIS — Z95810 Presence of automatic (implantable) cardiac defibrillator: Secondary | ICD-10-CM

## 2020-10-17 DIAGNOSIS — H9190 Unspecified hearing loss, unspecified ear: Secondary | ICD-10-CM | POA: Diagnosis present

## 2020-10-17 DIAGNOSIS — I480 Paroxysmal atrial fibrillation: Secondary | ICD-10-CM | POA: Diagnosis present

## 2020-10-17 DIAGNOSIS — Z888 Allergy status to other drugs, medicaments and biological substances status: Secondary | ICD-10-CM

## 2020-10-17 DIAGNOSIS — K219 Gastro-esophageal reflux disease without esophagitis: Secondary | ICD-10-CM | POA: Diagnosis present

## 2020-10-17 DIAGNOSIS — D72829 Elevated white blood cell count, unspecified: Secondary | ICD-10-CM | POA: Diagnosis present

## 2020-10-17 DIAGNOSIS — I5020 Unspecified systolic (congestive) heart failure: Secondary | ICD-10-CM | POA: Diagnosis present

## 2020-10-17 DIAGNOSIS — R0902 Hypoxemia: Secondary | ICD-10-CM

## 2020-10-17 DIAGNOSIS — K7290 Hepatic failure, unspecified without coma: Secondary | ICD-10-CM | POA: Diagnosis present

## 2020-10-17 DIAGNOSIS — I255 Ischemic cardiomyopathy: Secondary | ICD-10-CM | POA: Diagnosis present

## 2020-10-17 DIAGNOSIS — F32A Depression, unspecified: Secondary | ICD-10-CM | POA: Diagnosis present

## 2020-10-17 DIAGNOSIS — Z79899 Other long term (current) drug therapy: Secondary | ICD-10-CM

## 2020-10-17 DIAGNOSIS — J9601 Acute respiratory failure with hypoxia: Secondary | ICD-10-CM | POA: Diagnosis present

## 2020-10-17 LAB — URINALYSIS, COMPLETE (UACMP) WITH MICROSCOPIC
Bacteria, UA: NONE SEEN
Bilirubin Urine: NEGATIVE
Glucose, UA: NEGATIVE mg/dL
Hgb urine dipstick: NEGATIVE
Ketones, ur: NEGATIVE mg/dL
Leukocytes,Ua: NEGATIVE
Nitrite: NEGATIVE
Protein, ur: NEGATIVE mg/dL
Specific Gravity, Urine: 1.021 (ref 1.005–1.030)
pH: 5 (ref 5.0–8.0)

## 2020-10-17 LAB — SAMPLE TO BLOOD BANK

## 2020-10-17 LAB — BLOOD GAS, VENOUS
Acid-base deficit: 0.5 mmol/L (ref 0.0–2.0)
Bicarbonate: 24.2 mmol/L (ref 20.0–28.0)
O2 Saturation: 92.7 %
Patient temperature: 37
pCO2, Ven: 39 mmHg — ABNORMAL LOW (ref 44.0–60.0)
pH, Ven: 7.4 (ref 7.250–7.430)
pO2, Ven: 66 mmHg — ABNORMAL HIGH (ref 32.0–45.0)

## 2020-10-17 LAB — COMPREHENSIVE METABOLIC PANEL
ALT: 30 U/L (ref 0–44)
AST: 36 U/L (ref 15–41)
Albumin: 3.1 g/dL — ABNORMAL LOW (ref 3.5–5.0)
Alkaline Phosphatase: 133 U/L — ABNORMAL HIGH (ref 38–126)
Anion gap: 10 (ref 5–15)
BUN: 28 mg/dL — ABNORMAL HIGH (ref 8–23)
CO2: 24 mmol/L (ref 22–32)
Calcium: 9 mg/dL (ref 8.9–10.3)
Chloride: 98 mmol/L (ref 98–111)
Creatinine, Ser: 1.95 mg/dL — ABNORMAL HIGH (ref 0.61–1.24)
GFR, Estimated: 37 mL/min — ABNORMAL LOW (ref >60)
Glucose, Bld: 107 mg/dL — ABNORMAL HIGH (ref 70–99)
Potassium: 4.3 mmol/L (ref 3.5–5.1)
Sodium: 132 mmol/L — ABNORMAL LOW (ref 135–145)
Total Bilirubin: 1.6 mg/dL — ABNORMAL HIGH (ref 0.3–1.2)
Total Protein: 7.1 g/dL (ref 6.5–8.1)

## 2020-10-17 LAB — CBC WITH DIFFERENTIAL/PLATELET
Abs Immature Granulocytes: 0.07 10*3/uL (ref 0.00–0.07)
Basophils Absolute: 0.1 10*3/uL (ref 0.0–0.1)
Basophils Relative: 1 %
Eosinophils Absolute: 0 10*3/uL (ref 0.0–0.5)
Eosinophils Relative: 0 %
HCT: 27.4 % — ABNORMAL LOW (ref 39.0–52.0)
Hemoglobin: 8.7 g/dL — ABNORMAL LOW (ref 13.0–17.0)
Immature Granulocytes: 1 %
Lymphocytes Relative: 4 %
Lymphs Abs: 0.4 10*3/uL — ABNORMAL LOW (ref 0.7–4.0)
MCH: 30.6 pg (ref 26.0–34.0)
MCHC: 31.8 g/dL (ref 30.0–36.0)
MCV: 96.5 fL (ref 80.0–100.0)
Monocytes Absolute: 1.1 10*3/uL — ABNORMAL HIGH (ref 0.1–1.0)
Monocytes Relative: 10 %
Neutro Abs: 9.8 10*3/uL — ABNORMAL HIGH (ref 1.7–7.7)
Neutrophils Relative %: 84 %
Platelets: 345 10*3/uL (ref 150–400)
RBC: 2.84 MIL/uL — ABNORMAL LOW (ref 4.22–5.81)
RDW: 17.6 % — ABNORMAL HIGH (ref 11.5–15.5)
WBC: 11.5 10*3/uL — ABNORMAL HIGH (ref 4.0–10.5)
nRBC: 0 % (ref 0.0–0.2)

## 2020-10-17 LAB — TROPONIN I (HIGH SENSITIVITY)
Troponin I (High Sensitivity): 53 ng/L — ABNORMAL HIGH (ref <18)
Troponin I (High Sensitivity): 58 ng/L — ABNORMAL HIGH (ref <18)

## 2020-10-17 LAB — RESP PANEL BY RT-PCR (FLU A&B, COVID) ARPGX2
Influenza A by PCR: NEGATIVE
Influenza B by PCR: NEGATIVE
SARS Coronavirus 2 by RT PCR: NEGATIVE

## 2020-10-17 LAB — APTT: aPTT: 43 seconds — ABNORMAL HIGH (ref 24–36)

## 2020-10-17 LAB — LACTIC ACID, PLASMA
Lactic Acid, Venous: 1.6 mmol/L (ref 0.5–1.9)
Lactic Acid, Venous: 1.5 mmol/L (ref 0.5–1.9)

## 2020-10-17 LAB — GLUCOSE, CAPILLARY: Glucose-Capillary: 92 mg/dL (ref 70–99)

## 2020-10-17 LAB — PROTIME-INR
INR: 1.4 — ABNORMAL HIGH (ref 0.8–1.2)
Prothrombin Time: 16.9 seconds — ABNORMAL HIGH (ref 11.4–15.2)

## 2020-10-17 LAB — BRAIN NATRIURETIC PEPTIDE: B Natriuretic Peptide: 2556.7 pg/mL — ABNORMAL HIGH (ref 0.0–100.0)

## 2020-10-17 LAB — POC SARS CORONAVIRUS 2 AG -  ED: SARS Coronavirus 2 Ag: NEGATIVE

## 2020-10-17 MED ORDER — ACETAMINOPHEN 650 MG RE SUPP
650.0000 mg | Freq: Once | RECTAL | Status: AC
  Administered 2020-10-17: 650 mg via RECTAL
  Filled 2020-10-17: qty 1

## 2020-10-17 MED ORDER — DULOXETINE HCL 30 MG PO CPEP
30.0000 mg | ORAL_CAPSULE | Freq: Every day | ORAL | Status: DC
  Filled 2020-10-17: qty 1

## 2020-10-17 MED ORDER — SUCRALFATE 1 G PO TABS
1.0000 g | ORAL_TABLET | Freq: Two times a day (BID) | ORAL | Status: DC
  Administered 2020-10-18 – 2020-10-19 (×3): 1 g via ORAL
  Filled 2020-10-17 (×4): qty 1

## 2020-10-17 MED ORDER — LACTATED RINGERS IV BOLUS (SEPSIS)
250.0000 mL | Freq: Once | INTRAVENOUS | Status: AC
  Administered 2020-10-17: 250 mL via INTRAVENOUS

## 2020-10-17 MED ORDER — AMIODARONE HCL 200 MG PO TABS
200.0000 mg | ORAL_TABLET | Freq: Every day | ORAL | Status: DC
  Administered 2020-10-18 – 2020-10-19 (×2): 200 mg via ORAL
  Filled 2020-10-17 (×2): qty 1

## 2020-10-17 MED ORDER — FERROUS SULFATE 325 (65 FE) MG PO TABS
325.0000 mg | ORAL_TABLET | Freq: Every day | ORAL | Status: DC
  Administered 2020-10-18 – 2020-10-19 (×2): 325 mg via ORAL
  Filled 2020-10-17 (×2): qty 1

## 2020-10-17 MED ORDER — LACTATED RINGERS IV BOLUS (SEPSIS): 500.0000 mL | Freq: Once | INTRAVENOUS | Status: DC

## 2020-10-17 MED ORDER — ONDANSETRON HCL 4 MG/2ML IJ SOLN: 4.0000 mg | Freq: Four times a day (QID) | INTRAMUSCULAR | Status: DC | PRN

## 2020-10-17 MED ORDER — ORAL CARE MOUTH RINSE
15.0000 mL | Freq: Two times a day (BID) | OROMUCOSAL | Status: DC
  Administered 2020-10-18 – 2020-10-21 (×7): 15 mL via OROMUCOSAL

## 2020-10-17 MED ORDER — VANCOMYCIN HCL 500 MG/100ML IV SOLN
500.0000 mg | Freq: Once | INTRAVENOUS | Status: AC
  Administered 2020-10-17: 500 mg via INTRAVENOUS
  Filled 2020-10-17: qty 100

## 2020-10-17 MED ORDER — LACTATED RINGERS IV SOLN: INTRAVENOUS | Status: DC

## 2020-10-17 MED ORDER — MIDODRINE HCL 5 MG PO TABS
5.0000 mg | ORAL_TABLET | Freq: Three times a day (TID) | ORAL | Status: DC
  Administered 2020-10-18 – 2020-10-19 (×6): 5 mg via ORAL
  Filled 2020-10-17 (×8): qty 1

## 2020-10-17 MED ORDER — CHLORHEXIDINE GLUCONATE 0.12 % MT SOLN
15.0000 mL | Freq: Two times a day (BID) | OROMUCOSAL | Status: DC
  Administered 2020-10-17 – 2020-10-20 (×6): 15 mL via OROMUCOSAL
  Filled 2020-10-17 (×5): qty 15

## 2020-10-17 MED ORDER — ONDANSETRON HCL 4 MG PO TABS: 4.0000 mg | ORAL_TABLET | Freq: Four times a day (QID) | ORAL | Status: DC | PRN

## 2020-10-17 MED ORDER — ACETAMINOPHEN 650 MG RE SUPP: 325.0000 mg | Freq: Four times a day (QID) | RECTAL | Status: DC | PRN

## 2020-10-17 MED ORDER — LACTATED RINGERS IV BOLUS: 250.0000 mL | Freq: Once | INTRAVENOUS | Status: DC

## 2020-10-17 MED ORDER — ACETAMINOPHEN 325 MG PO TABS: 325.0000 mg | ORAL_TABLET | Freq: Four times a day (QID) | ORAL | Status: DC | PRN

## 2020-10-17 MED ORDER — SODIUM CHLORIDE 0.9 % IV SOLN
2.0000 g | Freq: Once | INTRAVENOUS | Status: AC
  Administered 2020-10-17: 2 g via INTRAVENOUS
  Filled 2020-10-17: qty 2

## 2020-10-17 MED ORDER — PANTOPRAZOLE SODIUM 40 MG PO TBEC
40.0000 mg | DELAYED_RELEASE_TABLET | Freq: Every day | ORAL | Status: DC
  Administered 2020-10-18 – 2020-10-19 (×2): 40 mg via ORAL
  Filled 2020-10-17 (×2): qty 1

## 2020-10-17 MED ORDER — VANCOMYCIN HCL IN DEXTROSE 1-5 GM/200ML-% IV SOLN
1000.0000 mg | Freq: Once | INTRAVENOUS | Status: AC
  Administered 2020-10-17: 1000 mg via INTRAVENOUS
  Filled 2020-10-17: qty 200

## 2020-10-17 MED ORDER — CHLORHEXIDINE GLUCONATE CLOTH 2 % EX PADS
6.0000 | MEDICATED_PAD | Freq: Every day | CUTANEOUS | Status: DC
  Administered 2020-10-17 – 2020-10-20 (×4): 6 via TOPICAL
  Filled 2020-10-17: qty 6

## 2020-10-17 NOTE — Sepsis Progress Note (Signed)
eLink is monitoring this Code Sepsis. °

## 2020-10-17 NOTE — ED Notes (Signed)
Blood pressure is 89/60. Issacs, MD notified

## 2020-10-17 NOTE — Progress Notes (Signed)
CODE SEPSIS - PHARMACY COMMUNICATION  **Broad Spectrum Antibiotics should be administered within 1 hour of Sepsis diagnosis**  Time Code Sepsis Called/Page Received: 1742  Antibiotics Ordered: Cefepime / vancomycin  Time of 1st antibiotic administration: 1813  Additional action taken by pharmacy: N/A   Carl [name] 09/25/2020  5:47 PM

## 2020-10-17 NOTE — H&P (Signed)
History and Physical   Carl [name] TKZ:[id number] DOB: [birth year] DOA: 10/16/2020  PCP: Carl Harrier, MD  Outpatient Specialists: Dr. Nelva [name], cardiology Patient coming from: SNF  I have personally briefly reviewed patient's old medical records in [place].  Chief Concern: Hypoxia  HPI: Carl [name] is a 70 y.o. male with medical history significant for previous GI bleed, acute kidney injury, recent admission for septic shock with chronic GI bleed, heart failure reduced ejection fraction, paroxysmal atrial fibrillation, history of hypertension, depression, liver cirrhosis, coronary artery disease, GERD, hearing loss, Schatzki's ring, tubular adenoma of the colon, peripheral vascular disease, osteoporosis, presents to the emergency department from [facility] for chief concerns of hypoxia on room air.  At bedside, patient was minimally responsive to sternal rub and loud verbal stimuli.  Patient appears cachectic, frail, ill, septic, and likely is failure to thrive.  Daughter, Carl [name] was at bedside and I updated daughter regarding patient's poor prognosis and deterioration.  Patient does not have legal healthcare power of attorney.  Daughter, Carl [name] did state that patient's brother Carl [name] was verbally assigned the role of POA at the past however he does not have legal documentation as a POA.  Per daughter, patient is not married and has 2 children herself, Carl [name], and a son.  At bedside, patient is lethargic, admitted notably responsive to sternal rub and loud verbal stimuli.  ROS: Unable to complete due to altered mentation  ED Course: Discussed with ED provider, patient required hospitalization due to sepsis secondary to pneumonia.  Vitals in the emergency department was remarkable for fever of 10 1.9 Fahrenheit, increased respiration rate of 22-27, blood pressure of 99/65, satting at 94% with nonrebreather.  BNP elevated at 2556, troponin of 53.  WBC  elevated at 11.5, hemoglobin 8.7, hematocrit 27.4, serum sodium 132, glucose 107, BUN 28, serum creatinine 1.95, alk phos 133, T bili 1.6.  Assessment/Plan  Principal Problem:   Sepsis associated hypotension (HCC) Active Problems:   Protein-calorie malnutrition, severe (HCC)   AKI (acute kidney injury) ([place])   S/P CABG x 5   Hyperlipidemia   Essential hypertension   CAD (coronary artery disease)   GERD (gastroesophageal reflux disease)   Depression   Symptomatic anemia   Chronic systolic CHF (congestive heart failure) (HCC)   Cirrhosis (HCC)   Hyponatremia   Acute kidney injury superimposed on CKD (HCC)   Leukocytosis   HFrEF (heart failure with reduced ejection fraction) (HCC)   Paroxysmal atrial fibrillation (HCC)   Failure to thrive in adult   Severe sepsis with associated hypotension, responsive to fluids Pneumonia is the suspected source versus pulmonary congestion secondary to heart failure exacerbation -Organ involvement is respiratory, renal, encephalopathy, fever T-max of 101.9, increased respiration rate, leukocytosis 11.5, elevated BNP -Checking pro-Cal -Continue broad-spectrum antibiotic with cefepime and vancomycin per pharmacy -Status post lactated ringer to 250 cc/h, cefepime, vancomycin per ED provider -Midodrine 5 mg 3 times daily maintain MAP greater than 65 -BiPAP ordered -Admit to stepdown, inpatient, with telemetry  Acute kidney injury-suspect cardiorenal in setting of elevated BNP 2556 versus hepatorenal -I generally would treat with diuresis however given patient is septic and has low normotensive, no diuresis at this time as severe sepsis is more acute and life-threatening  Liver cirrhosis -meld-NA score is 23 -Serum creatinine 1.94, T bili 1.6, INR 1.4, sodium 132 -Patient is not a candidate for transplant  Paroxysmal atrial fibrillation-history of GI bleed -No anticoagulation at this time -Continue amiodarone 200 mg  daily  Failure to  thrive-patient is a candidate for hospice house or comfort care -A.m. team to consult palliative medicine  Symptomatic anemia-hemoglobin hematocrit is at baseline range, CBC in the a.m. Chronic anemia secondary to chronic GI bleed  Depression-Cymbalta 30 mg nightly resumed  History of gout-allopurinol 100 mg daily  GERD-Protonix 40 mg daily  Heart failure reduced ejection fraction EF of 20 to 25% -Echo: 3/74/2021 read as EF of 20 to 25%, left ventricular internal cavity size moderately dilated mildly elevated pulmonary artery systolic pressure, right atrial size was moderately dilated, left ventricle demonstrates global hypokinesis. -BNP elevated  Chart reviewed.   DVT prophylaxis: TED hose, patient has history of chronic GI bleed and acute anemia secondary to GI bleeding, therefore I have not ordered pharmacological DVT prophylaxis given the risk versus benefits Code Status: DNR Diet: N.p.o. due to concerns for aspiration  Family Communication: Discussed with [name] at bedside Disposition Plan: Poor prognosis Consults called: None Admission status: Inpatient to stepdown with telemetry  Past Medical History:  Diagnosis Date  . AICD (automatic cardioverter/defibrillator) present   . Anemia   . Anxiety   . Arthritis   . Brunner's gland hyperplasia of duodenum   . CHF (congestive heart failure) ([place])   . Chronic kidney disease   . Cirrhosis of liver ([place])   . Coronary artery disease   . Depression   . Dysrhythmia    atrial fib  . External hemorrhoids   . Fracture of femoral neck, left ([place]) 07/21/2018  . GERD (gastroesophageal reflux disease)   . Gout   . Hearing loss   . Heart murmur    mirtal valve insufficiency  . Hepatic failure ([place])   . HH (hiatus hernia)   . Hyperlipidemia   . Hypokalemia   . Hyposmolality   . Intellectual disability    mild  . Internal hemorrhoids   . Ischemic cardiomyopathy   . Leg fracture, left   . Myocardial infarction ([place])   .  Osteoporosis   . Paronychia   . Peripheral vascular disease ([place])   . Pneumonia   . Psoriasis   . Psoriasis   . PUD (peptic ulcer disease)   . Schatzki's ring   . Tubular adenoma of colon   . Vitiligo    Past Surgical History:  Procedure Laterality Date  . ATRIAL SEPTAL DEFECT(ASD) CLOSURE N/A 12/30/2018   Procedure: ATRIAL SEPTAL DEFECT(ASD) CLOSURE;  Surgeon: [name], MD;  Location: [place] [facility] LAB;  Service: Cardiovascular;  Laterality: N/A;  . CARDIAC SURGERY     ICD  . CARDIOVERSION N/A 11/09/2019   Procedure: CARDIOVERSION;  Surgeon: [name], MD;  Location: [facility] ENDOSCOPY;  Service: Cardiovascular;  Laterality: N/A;  . CARDIOVERSION N/A 12/21/2019   Procedure: CARDIOVERSION;  Surgeon: [name], MD;  Location: [place];  Service: Cardiovascular;  Laterality: N/A;  . CATARACT EXTRACTION W/PHACO Right 07/26/2020   Procedure: CATARACT EXTRACTION PHACO AND INTRAOCULAR LENS PLACEMENT ([place]) RIGHT VISION BLUE;  Surgeon: Birder Robson, MD;  Location: [facility] ORS;  Service: Ophthalmology;  Laterality: Right;  [place] 01:14.1 CDE 13.65 Fluid Pack Lot # [id number]  . COLONOSCOPY WITH ESOPHAGOGASTRODUODENOSCOPY (EGD)    . COLONOSCOPY WITH PROPOFOL N/A 08/24/2017   Procedure: COLONOSCOPY WITH PROPOFOL;  Surgeon: [name], MD;  Location: [facility] ENDOSCOPY;  Service: Endoscopy;  Laterality: N/A;  . COLONOSCOPY WITH PROPOFOL N/A 07/23/2020   Procedure: COLONOSCOPY WITH PROPOFOL;  Surgeon: Toledo, [name], MD;  Location: [facility] ENDOSCOPY;  Service: Gastroenterology;  Laterality: N/A;  . CORONARY ARTERY BYPASS GRAFT N/A 08/03/2014   Procedure: CORONARY ARTERY BYPASS GRAFTING (CABG);  Surgeon: [name], MD;  Location: [place];  Service: Open Heart Surgery;  Laterality: N/A;  . ENTEROSCOPY N/A 08/06/2020   Procedure: ENTEROSCOPY;  Surgeon: Jonathon Bellows, MD;  Location: [facility] ENDOSCOPY;  Service: Gastroenterology;  Laterality: N/A;  . EP IMPLANTABLE DEVICE N/A 05/01/2015    MDT ICD implanted for primary prevention of sudden death  . ESOPHAGOGASTRODUODENOSCOPY N/A 06/28/2020   Procedure: ESOPHAGOGASTRODUODENOSCOPY (EGD);  Surgeon: Clarene Essex, MD;  Location: [place];  Service: Endoscopy;  Laterality: N/A;  . ESOPHAGOGASTRODUODENOSCOPY (EGD) WITH PROPOFOL N/A 04/16/2015   Procedure: ESOPHAGOGASTRODUODENOSCOPY (EGD) WITH PROPOFOL;  Surgeon: [name], MD;  Location: [facility] ENDOSCOPY;  Service: Endoscopy;  Laterality: N/A;  . ESOPHAGOGASTRODUODENOSCOPY (EGD) WITH PROPOFOL N/A 08/24/2017   Procedure: ESOPHAGOGASTRODUODENOSCOPY (EGD) WITH PROPOFOL;  Surgeon: [name], MD;  Location: [facility] ENDOSCOPY;  Service: Endoscopy;  Laterality: N/A;  . FRACTURE SURGERY Left    ORIF lower leg  . GIVENS CAPSULE STUDY N/A 08/03/2020   Procedure: GIVENS CAPSULE STUDY;  Surgeon: Jonathon Bellows, MD;  Location: [facility] ENDOSCOPY;  Service: Gastroenterology;  Laterality: N/A;  . HERNIA REPAIR    . HIP PINNING,CANNULATED Left 07/22/2018   Procedure: CANNULATED HIP PINNING;  Surgeon: [name], MD;  Location: [place];  Service: Orthopedics;  Laterality: Left;  . MITRAL VALVE REPAIR N/A 12/30/2018   Procedure: MITRAL VALVE REPAIR;  Surgeon: [name], MD;  Location: [place] CV LAB;  Service: Cardiovascular;  Laterality: N/A;  . RIGHT/LEFT HEART CATH AND CORONARY/GRAFT ANGIOGRAPHY N/A 12/21/2018   Procedure: RIGHT/LEFT HEART CATH AND CORONARY/GRAFT ANGIOGRAPHY;  Surgeon: [name], MD;  Location: [place] [facility] LAB;  Service: Cardiovascular;  Laterality: N/A;  . TEE WITHOUT CARDIOVERSION N/A 08/03/2014   Procedure: TRANSESOPHAGEAL ECHOCARDIOGRAM (TEE);  Surgeon: [name], MD;  Location: [place];  Service: Open Heart Surgery;  Laterality: N/A;  . TEE WITHOUT CARDIOVERSION N/A 02/10/2018   Procedure: TRANSESOPHAGEAL ECHOCARDIOGRAM (TEE);  Surgeon: [name], MD;  Location: [facility] ENDOSCOPY;  Service: Cardiovascular;  Laterality: N/A;  . TEE WITHOUT  CARDIOVERSION N/A 11/09/2019   Procedure: TRANSESOPHAGEAL ECHOCARDIOGRAM (TEE);  Surgeon: [name], MD;  Location: [facility] ENDOSCOPY;  Service: Cardiovascular;  Laterality: N/A;   Social History:  reports that he quit smoking about 8 years ago. He has never used smokeless tobacco. He reports that he does not drink alcohol and does not use drugs.  Allergies  Allergen Reactions  . Spironolactone Other (See Comments)    gynecomastia    Family History  Problem Relation Age of Onset  . Valvular heart disease Mother        Ruptured valve  . CAD Father   . Heart Problems Brother        Stents x 4  . Diabetes Brother   . Prostate cancer Neg Hx   . Bladder Cancer Neg Hx   . Kidney cancer Neg Hx    Family history: Family history reviewed and not pertinent  Prior to Admission medications   Medication Sig Start Date End Date Taking? Authorizing Provider  allopurinol (ZYLOPRIM) 100 MG tablet Take 100 mg by mouth daily.  02/08/19  Yes [provider]  amiodarone (PACERONE) 200 MG tablet Take 1 tablet (200 mg total) by mouth daily. 04/13/20  Yes Bensimhon, Shaune Pascal, MD  DULoxetine (CYMBALTA) 30 MG capsule Take 30 mg by mouth at bedtime.    Yes [provider]  ferrous sulfate 325 (65 FE) MG tablet Take 1 tablet (325 mg total) by mouth daily with breakfast. 02/09/18  Yes [name], MD  gabapentin (NEURONTIN) 300 MG capsule Take 300 mg by mouth daily. 08/23/20  Yes [provider]  ketoconazole (NIZORAL) 2 % shampoo Apply 1 application topically 2 (two) times a week. Tues, Friday 09/28/18  Yes [provider]  Nutritional Supplements (ENSURE COMPLETE SHAKE) LIQD Take 1 Can by mouth 3 (three) times daily. Between meals 08/27/20  Yes Simmons, Brittainy M, PA-C  pantoprazole (PROTONIX) 40 MG tablet TAKE 1 TABLET BY MOUTH EVERY DAY 08/06/20  Yes [name], MD  sucralfate (CARAFATE) 1 g tablet Take 1 tablet (1 g total) by mouth 2 (two) times daily. 07/05/20   Yes Gaylan Gerold, DO  zinc oxide (BALMEX) 11.3 % CREA cream Apply 1 application topically 2 (two) times daily. Apply to butt   Yes [provider]   Physical Exam: Vitals:   10/02/2020 1800 10/02/2020 2030 09/26/2020 2100 10/15/2020 2115  BP: (!) 89/60 93/63  98/66  Pulse: 79 71 73 70  Resp: (!) 27 19 (!) 27 19  Temp:   97.8 F (36.6 C)   TempSrc:   Axillary   SpO2: 96% 97% 96% 99%  Weight:    57.5 kg  Height:    '5\' 10"'  (1.778 m)   Constitutional: appears age-appropriate, frail, cachectic, chronically ill, not acutely ill, NAD, calm, comfortable Eyes: PERRL, lids and conjunctivae normal ENMT: Mucous membranes are dry. Posterior pharynx clear of any exudate or lesions. Age-appropriate dentition.  Unable to assess as patient is minimally responsive Neck: normal, supple, no masses, no thyromegaly Respiratory: Decreased breath sounds diffusely, increased respiratory effort.  Increased accessory muscle use.  Cardiovascular: Regular rate and rhythm, no murmurs / rubs / gallops.  Bilateral 1+ pitting edema lower extremity. 2+ pedal pulses. No carotid bruits.  Abdomen: no tenderness, no masses palpated, no hepatosplenomegaly. Bowel sounds positive.  Musculoskeletal: no clubbing / cyanosis. No joint deformity upper and lower extremities. Good ROM, no contractures, no atrophy. Normal muscle tone.  Skin: no rashes, lesions, ulcers. No induration.  Pale Neurologic: Sensation intact. Strength 3/5 in all 4.  Psychiatric: Unable to assess as patient is minimally responsive  EKG: independently reviewed, showing sinus rhythm with rate of 82, QTc 487, right bundle branch block present on previous EKG including 08/20/2020 and 10/08/2020  Chest x-ray on Admission: I personally reviewed and I agree with radiologist reading as below.  DG Chest Port 1 View  Result Date:  CLINICAL DATA:  Questionable sepsis. EXAM: PORTABLE CHEST 1 VIEW COMPARISON:  10/11/2020 FINDINGS: There is a left chest wall  ICD with lead in the right ventricle. Previous median sternotomy and CABG procedure. Stable cardiac enlargement. Bilateral pleural effusions noted, left greater than right. Extensive bilateral interstitial and airspace densities have increased from previous exam. IMPRESSION: 1. Worsening aeration to both lungs. 2. Bilateral pleural effusions, left greater than right. Electronically Signed   By: Kerby Moors M.D.   On: 09/26/2020 18:39   Labs on Admission: I have personally reviewed following labs  CBC: Recent Labs  Lab 10/11/20 0401 10/12/20 0357 10/13/20 0503 10/14/20 0327 10/14/2020 1722  WBC 7.0 7.3 5.9 5.4 11.5*  NEUTROABS  --   --   --   --  9.8*  HGB 9.5* 8.0* 8.2* 8.4* 8.7*  HCT 29.8* 25.9* 26.5* 27.0* 27.4*  MCV 95.8 99.6 97.8 98.5 96.5  PLT 376 297 306 297  543   Basic Metabolic Panel: Recent Labs  Lab 10/11/20 0401 10/12/20 0357 10/12/20 2037 10/13/20 0503 10/14/20 0327 10/15/20 0520 10/16/20 0538 10/06/2020 1722  NA 136 133*  --  133* 135  --   --  132*  K 4.0 3.2*  --  3.9 3.9  --   --  4.3  CL 102 105  --  100 103  --   --  98  CO2 27 22  --  25 23  --   --  24  GLUCOSE 92 230*  --  85 94  --   --  107*  BUN 44* 26*  --  23 23  --   --  28*  CREATININE 1.60* 1.20  --  1.23 1.29*  --   --  1.95*  CALCIUM 8.6* 7.5*  --  8.8* 8.7*  --   --  9.0  MG 1.8 1.6*  --  2.4 2.2 2.0 2.1  --   PHOS 2.2* 1.6* 3.6 3.2 3.6 3.6 3.4  --    GFR: Estimated Creatinine Clearance: 29.1 mL/min (A) (by C-G formula based on SCr of 1.95 mg/dL (H)).  Liver Function Tests: Recent Labs  Lab 10/14/2020 1722  AST 36  ALT 30  ALKPHOS 133*  BILITOT 1.6*  PROT 7.1  ALBUMIN 3.1*   Coagulation Profile: Recent Labs  Lab 10/18/2020 1722  INR 1.4*   Urine analysis:    Component Value Date/Time   COLORURINE YELLOW (A) 08/03/2020 0421   APPEARANCEUR CLEAR (A) 08/03/2020 0421   APPEARANCEUR Clear 04/28/2017 1259   LABSPEC 1.010 08/03/2020 0421   PHURINE 6.0 08/03/2020 0421   GLUCOSEU  NEGATIVE 08/03/2020 0421   HGBUR NEGATIVE 08/03/2020 0421   BILIRUBINUR NEGATIVE 08/03/2020 0421   BILIRUBINUR Negative 04/28/2017 1259   KETONESUR NEGATIVE 08/03/2020 0421   PROTEINUR NEGATIVE 08/03/2020 0421   NITRITE NEGATIVE 08/03/2020 0421   LEUKOCYTESUR NEGATIVE 08/03/2020 0421   CRITICAL CARE Performed by: Briant Cedar Cox  Total critical care time: 35 minutes  Critical care time was exclusive of separately billable procedures and treating other patients.  Critical care was necessary to treat or prevent imminent or life-threatening deterioration.  Critical care was time spent personally by me on the following activities: development of treatment plan with patient and/or surrogate as well as nursing, discussions with consultants, evaluation of patient's response to treatment, examination of patient, obtaining history from patient or surrogate, ordering and performing treatments and interventions, ordering and review of laboratory studies, ordering and review of radiographic studies, pulse oximetry and re-evaluation of patient's condition.  Amy N Cox D.O. [facility]  If 7PM-7AM, please contact overnight-coverage provider If 7AM-7PM, please contact day coverage provider [email or web address]  09/25/2020, 10:06 PM

## 2020-10-17 NOTE — ED Triage Notes (Signed)
Pt [facility] from [facility] where admitted yesterday from recent stay for GI bleed and sepsis. Pt 86 onRA. Placed on nrb dnr at bedside from facility Pt alert  disoriented

## 2020-10-17 NOTE — Progress Notes (Addendum)
Pharmacy Antibiotic Note  Carl [name] is a 70 y.o. male admitted on 10/03/2020 with pneumonia.  Pharmacy has been consulted for Cefepime and Vancomycin dosing.  Pt given Cefepime 2 gm 2/23 @ 1813 and total LD of Vanc 1500 mg starting 2/23.    Plan: Pt in AKI with SCr 1.95, CrCl 29.1.  Based on current renal fxn, Cefepime dosing interval q24hr and AUC calc Vanc 1250 q48h.  Holding off on placing scheduled orders at this time, and will place orders following AM lab results.  2/24 0546 SCr 1.98, CrCl 28.6  Placing orders for Cefepime and Vanc.  Pharmacy will continue to follow SCr and adjust abx dosing when warranted.  Height: 5\' 10"  (177.8 cm) Weight: 57.5 kg (126 lb 12.2 oz) IBW/kg (Calculated) : 73  Temp (24hrs), Avg:99.9 F (37.7 C), Min:97.8 F (36.6 C), Max:101.9 F (38.8 C)  Recent Labs  Lab 10/11/20 0401 10/12/20 0357 10/13/20 0503 10/14/20 0327 10/02/2020 1722 09/30/2020 1723 10/13/2020 2107  WBC 7.0 7.3 5.9 5.4 11.5*  --   --   CREATININE 1.60* 1.20 1.23 1.29* 1.95*  --   --   LATICACIDVEN  --   --   --   --   --  1.6 1.5    Estimated Creatinine Clearance: 29.1 mL/min (A) (by C-G formula based on SCr of 1.95 mg/dL (H)).    Allergies  Allergen Reactions  . Spironolactone Other (See Comments)    gynecomastia     Antimicrobials this admission: 02/23 Cefepime >>  02/23 Vancomycin >>   Microbiology results: 02/23 BCx: Pending 02/23 UCx: Pending   Thank you for allowing pharmacy to be a part of this patient's care.  Renda Rolls, PharmD, [facility] 09/26/2020 11:45 PM

## 2020-10-17 NOTE — ED Provider Notes (Signed)
[facility] Emergency Department Provider Note  ____________________________________________   Event Date/Time   First MD Initiated Contact with Patient 09/27/2020 1721     (approximate)  I have reviewed the triage vital signs and the nursing notes.   HISTORY  Chief Complaint hypoxia    HPI Carl [name] is a 70 y.o. male with history of CHF, CAD, hypertension, hyperlipidemia, here with shortness of breath.  The patient was just hospitalized for GI bleed with prolonged stay complicated by  shock requiring pressors, here with shortness of breath.  Patient reportedly was just discharged from the hospital yesterday.  Throughout the day today, per family, the patient has had increasing shortness of breath.  Patient has been increasingly confused throughout the day.  He has also had hypotension and hypoxia.  On EMS arrival, patient was in obvious respiratory distress, requiring nonrebreather in route.  He was satting in the 80s.  He was confused.  Mentation has improved on oxygen.  Per report from EMS, family has been considering hospice first palliative but patient has not full comfort care.  Level 5 caveat invoked as remainder of history, ROS, and physical exam limited due to patient's resp distress.        Past Medical History:  Diagnosis Date  . AICD (automatic cardioverter/defibrillator) present   . Anemia   . Anxiety   . Arthritis   . Brunner's gland hyperplasia of duodenum   . CHF (congestive heart failure) ([place])   . Chronic kidney disease   . Cirrhosis of liver ([place])   . Coronary artery disease   . Depression   . Dysrhythmia    atrial fib  . External hemorrhoids   . Fracture of femoral neck, left ([place]) 07/21/2018  . GERD (gastroesophageal reflux disease)   . Gout   . Hearing loss   . Heart murmur    mirtal valve insufficiency  . Hepatic failure ([place])   . HH (hiatus hernia)   . Hyperlipidemia   . Hypokalemia   . Hyposmolality   .  Intellectual disability    mild  . Internal hemorrhoids   . Ischemic cardiomyopathy   . Leg fracture, left   . Myocardial infarction ([place])   . Osteoporosis   . Paronychia   . Peripheral vascular disease ([place])   . Pneumonia   . Psoriasis   . Psoriasis   . PUD (peptic ulcer disease)   . Schatzki's ring   . Tubular adenoma of colon   . Vitiligo     Patient Active Problem List   Diagnosis Date Noted  . Sepsis associated hypotension ([place]) 10/06/2020  . HFrEF (heart failure with reduced ejection fraction) ([place])   . Paroxysmal atrial fibrillation (HCC)   . Shock circulatory ([place]) 10/08/2020  . Acute kidney injury superimposed on CKD ([place]) 08/03/2020  . Leukocytosis 08/03/2020  . Acute on chronic blood loss anemia 08/02/2020  . Pressure injury of skin 06/29/2020  . Hyponatremia 06/27/2020  . Acute GI bleeding 06/27/2020  . 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 Persistent atrial fibrillation ([place]) 11/30/2019  . Secondary hypercoagulable state ([place]) 11/30/2019  . Arm DVT (deep venous thromboembolism), acute, left ([place]) 11/26/2019  . Hx of adenomatous colonic polyps 05/18/2019  . Cirrhosis ([place]) 05/12/2019  . Occult blood positive stool 05/12/2019  . HCAP (healthcare-associated pneumonia) 03/04/2019  . Hallucinations 02/10/2019  . Elevated blood sugar 01/27/2019  . S/P mitral valve repair 12/31/2018  . Bilateral pulmonary contusion 10/17/2018  . Cardiac LV ejection  fraction of 20-34% 10/17/2018  . Chronic systolic CHF (congestive heart failure) ([place]) 10/17/2018  . History of ischemic cardiomyopathy 10/17/2018  . ICD (implantable cardioverter-defibrillator) in place 10/17/2018  . S/p left hip fracture 10/17/2018  . Traumatic retroperitoneal hematoma 10/17/2018  . Age-related osteoporosis with current pathological fracture with routine healing 07/28/2018  . CAD (coronary artery disease) 07/21/2018  . GERD (gastroesophageal reflux disease) 07/21/2018  . Depression 07/21/2018   . CKD (chronic kidney disease), stage III ([place]) 07/21/2018  . Iron deficiency anemia 07/21/2018  . Hypokalemia 07/21/2018  . Closed left hip fracture ([place]) 07/21/2018  . Fracture of femoral neck, left ([place]) 07/21/2018  . Lower extremity pain, bilateral 11/12/2017  . 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 Hyperlipidemia 09/01/2017  . Essential hypertension 09/01/2017  . PAD (peripheral artery disease) ([place]) 09/01/2017  . Symptomatic anemia 08/06/2017  . GAD (generalized anxiety disorder) 06/18/2017  . Prostate cancer screening 04/28/2017  . Hydrocele 04/28/2017  . Bradycardia   . Premature ventricular contraction   . Severe mitral insufficiency   . Cardiomyopathy, ischemic 04/26/2015  . Difficulty hearing 12/29/2014  . Acute on chronic systolic CHF (congestive heart failure) ([place]) 10/10/2014  . 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 Protein-calorie malnutrition, severe ([place]) 07/29/2014  . AKI (acute kidney injury) ([place]) 07/29/2014    Past Surgical History:  Procedure Laterality Date  . ATRIAL SEPTAL DEFECT(ASD) CLOSURE N/A 12/30/2018   Procedure: ATRIAL SEPTAL DEFECT(ASD) CLOSURE;  Surgeon: [name], MD;  Location: [place] [facility] LAB;  Service: Cardiovascular;  Laterality: N/A;  . CARDIAC SURGERY     ICD  . CARDIOVERSION N/A 11/09/2019   Procedure: CARDIOVERSION;  Surgeon: [name], MD;  Location: [facility] ENDOSCOPY;  Service: Cardiovascular;  Laterality: N/A;  . CARDIOVERSION N/A 12/21/2019   Procedure: CARDIOVERSION;  Surgeon: [name], MD;  Location: [place];  Service: Cardiovascular;  Laterality: N/A;  . CATARACT EXTRACTION W/PHACO Right 07/26/2020   Procedure: CATARACT EXTRACTION PHACO AND INTRAOCULAR LENS PLACEMENT ([place]) RIGHT VISION BLUE;  Surgeon: Birder Robson, MD;  Location: [facility] ORS;  Service: Ophthalmology;  Laterality: Right;  [place] 01:14.1 CDE 13.65 Fluid Pack Lot # [id number]  . COLONOSCOPY WITH ESOPHAGOGASTRODUODENOSCOPY (EGD)    .  COLONOSCOPY WITH PROPOFOL N/A 08/24/2017   Procedure: COLONOSCOPY WITH PROPOFOL;  Surgeon: [name], MD;  Location: [facility] ENDOSCOPY;  Service: Endoscopy;  Laterality: N/A;  . COLONOSCOPY WITH PROPOFOL N/A 07/23/2020   Procedure: COLONOSCOPY WITH PROPOFOL;  Surgeon: Toledo, [name], MD;  Location: [facility] ENDOSCOPY;  Service: Gastroenterology;  Laterality: N/A;  . CORONARY ARTERY BYPASS GRAFT N/A 08/03/2014   Procedure: CORONARY ARTERY BYPASS GRAFTING (CABG);  Surgeon: [name], MD;  Location: [place];  Service: Open Heart Surgery;  Laterality: N/A;  . ENTEROSCOPY N/A 08/06/2020   Procedure: ENTEROSCOPY;  Surgeon: Jonathon Bellows, MD;  Location: [facility] ENDOSCOPY;  Service: Gastroenterology;  Laterality: N/A;  . EP IMPLANTABLE DEVICE N/A 05/01/2015   MDT ICD implanted for primary prevention of sudden death  . ESOPHAGOGASTRODUODENOSCOPY N/A 06/28/2020   Procedure: ESOPHAGOGASTRODUODENOSCOPY (EGD);  Surgeon: Clarene Essex, MD;  Location: [place];  Service: Endoscopy;  Laterality: N/A;  . ESOPHAGOGASTRODUODENOSCOPY (EGD) WITH PROPOFOL N/A 04/16/2015   Procedure: ESOPHAGOGASTRODUODENOSCOPY (EGD) WITH PROPOFOL;  Surgeon: [name], MD;  Location: [facility] ENDOSCOPY;  Service: Endoscopy;  Laterality: N/A;  . ESOPHAGOGASTRODUODENOSCOPY (EGD) WITH PROPOFOL N/A 08/24/2017   Procedure: ESOPHAGOGASTRODUODENOSCOPY (EGD) WITH PROPOFOL;  Surgeon: [name], MD;  Location: [facility] ENDOSCOPY;  Service: Endoscopy;  Laterality:  N/A;  . FRACTURE SURGERY Left    ORIF lower leg  . GIVENS CAPSULE STUDY N/A 08/03/2020   Procedure: GIVENS CAPSULE STUDY;  Surgeon: Jonathon Bellows, MD;  Location: [facility] ENDOSCOPY;  Service: Gastroenterology;  Laterality: N/A;  . HERNIA REPAIR    . HIP PINNING,CANNULATED Left 07/22/2018   Procedure: CANNULATED HIP PINNING;  Surgeon: [name], MD;  Location: [place];  Service: Orthopedics;  Laterality: Left;  . MITRAL VALVE REPAIR N/A 12/30/2018   Procedure: MITRAL VALVE  REPAIR;  Surgeon: [name], MD;  Location: [place] CV LAB;  Service: Cardiovascular;  Laterality: N/A;  . RIGHT/LEFT HEART CATH AND CORONARY/GRAFT ANGIOGRAPHY N/A 12/21/2018   Procedure: RIGHT/LEFT HEART CATH AND CORONARY/GRAFT ANGIOGRAPHY;  Surgeon: [name], MD;  Location: [place] [facility] LAB;  Service: Cardiovascular;  Laterality: N/A;  . TEE WITHOUT CARDIOVERSION N/A 08/03/2014   Procedure: TRANSESOPHAGEAL ECHOCARDIOGRAM (TEE);  Surgeon: [name], MD;  Location: [place];  Service: Open Heart Surgery;  Laterality: N/A;  . TEE WITHOUT CARDIOVERSION N/A 02/10/2018   Procedure: TRANSESOPHAGEAL ECHOCARDIOGRAM (TEE);  Surgeon: [name], MD;  Location: [facility] ENDOSCOPY;  Service: Cardiovascular;  Laterality: N/A;  . TEE WITHOUT CARDIOVERSION N/A 11/09/2019   Procedure: TRANSESOPHAGEAL ECHOCARDIOGRAM (TEE);  Surgeon: [name], MD;  Location: [facility] ENDOSCOPY;  Service: Cardiovascular;  Laterality: N/A;    Prior to Admission medications   Medication Sig Start Date End Date Taking? Authorizing Provider  allopurinol (ZYLOPRIM) 100 MG tablet Take 100 mg by mouth daily.  02/08/19  Yes [provider]  amiodarone (PACERONE) 200 MG tablet Take 1 tablet (200 mg total) by mouth daily. 04/13/20  Yes Bensimhon, Shaune Pascal, MD  DULoxetine (CYMBALTA) 30 MG capsule Take 30 mg by mouth at bedtime.    Yes [provider]  ferrous sulfate 325 (65 FE) MG tablet Take 1 tablet (325 mg total) by mouth daily with breakfast. 02/09/18  Yes [name], MD  gabapentin (NEURONTIN) 300 MG capsule Take 300 mg by mouth daily. 08/23/20  Yes [provider]  ketoconazole (NIZORAL) 2 % shampoo Apply 1 application topically 2 (two) times a week. Tues, Friday 09/28/18  Yes [provider]  Nutritional Supplements (ENSURE COMPLETE SHAKE) LIQD Take 1 Can by mouth 3 (three) times daily. Between meals 08/27/20  Yes Simmons, Brittainy M, PA-C  pantoprazole (PROTONIX) 40 MG tablet  TAKE 1 TABLET BY MOUTH EVERY DAY 08/06/20  Yes [name], MD  sucralfate (CARAFATE) 1 g tablet Take 1 tablet (1 g total) by mouth 2 (two) times daily. 07/05/20  Yes Gaylan Gerold, DO  zinc oxide (BALMEX) 11.3 % CREA cream Apply 1 application topically 2 (two) times daily. Apply to butt   Yes [provider]    Allergies Spironolactone  Family History  Problem Relation Age of Onset  . Valvular heart disease Mother        Ruptured valve  . CAD Father   . Heart Problems Brother        Stents x 4  . Diabetes Brother   . Prostate cancer Neg Hx   . Bladder Cancer Neg Hx   . Kidney cancer Neg Hx     Social History Social History   Tobacco Use  . Smoking status: Former Smoker    Quit date: 2014    Years since quitting: 8.1  . Smokeless tobacco: Never Used  . Tobacco comment: 07/30/2017 Quit in 2014  Vaping Use  . Vaping Use: Never used  Substance  Use Topics  . Alcohol use: No    Alcohol/week: 0.0 standard drinks  . Drug use: No    Review of Systems  Review of Systems  Unable to perform ROS: Acuity of condition  Constitutional: Positive for fatigue.  Respiratory: Positive for cough and shortness of breath.   Neurological: Positive for weakness.     ____________________________________________  PHYSICAL EXAM:      VITAL SIGNS: ED Triage Vitals  Enc Vitals Group     BP 09/28/2020 1724 104/73     Pulse Rate 10/05/2020 1724 83     Resp 09/28/2020 1724 (!) 28     Temp 09/26/2020 1735 (!) 101.9 F (38.8 C)     Temp Source 09/30/2020 1735 Rectal     SpO2 10/22/2020 1724 97 %     Weight 10/16/2020 1720 171 lb 11.8 oz (77.9 kg)     Height 10/10/2020 1720 5\' 10"  (1.778 m)     Head Circumference --      Peak Flow --      Pain Score --      Pain Loc --      Pain Edu? --      Excl. in [place]? --      Physical Exam Vitals and nursing note reviewed.  Constitutional:      General: He is not in acute distress.    Appearance: He is well-developed.  HENT:     Head:  Normocephalic and atraumatic.  Eyes:     Conjunctiva/sclera: Conjunctivae normal.  Cardiovascular:     Rate and Rhythm: Regular rhythm. Tachycardia present.     Heart sounds: Normal heart sounds. No murmur heard. No friction rub.  Pulmonary:     Effort: Tachypnea and respiratory distress present.     Breath sounds: Examination of the right-upper field reveals rales. Examination of the left-upper field reveals rales. Examination of the right-middle field reveals rales. Examination of the left-middle field reveals rales. Examination of the right-lower field reveals rales. Examination of the left-lower field reveals rales. Decreased breath sounds and rales present. No wheezing.  Abdominal:     General: There is no distension.     Palpations: Abdomen is soft.     Tenderness: There is no abdominal tenderness.  Musculoskeletal:     Cervical back: Neck supple.  Skin:    General: Skin is warm.     Capillary Refill: Capillary refill takes less than 2 seconds.  Neurological:     Mental Status: He is alert and oriented to person, place, and time.     Motor: No abnormal muscle tone.       ____________________________________________   LABS (all labs ordered are listed, but only abnormal results are displayed)  Labs Reviewed  BLOOD GAS, VENOUS - Abnormal; Notable for the following components:      Result Value   pCO2, Ven 39 (*)    pO2, Ven 66.0 (*)    All other components within normal limits  COMPREHENSIVE METABOLIC PANEL - Abnormal; Notable for the following components:   Sodium 132 (*)    Glucose, Bld 107 (*)    BUN 28 (*)    Creatinine, Ser 1.95 (*)    Albumin 3.1 (*)    Alkaline Phosphatase 133 (*)    Total Bilirubin 1.6 (*)    GFR, Estimated 37 (*)    All other components within normal limits  CBC WITH DIFFERENTIAL/PLATELET - Abnormal; Notable for the following components:   WBC 11.5 (*)    RBC  2.84 (*)    Hemoglobin 8.7 (*)    HCT 27.4 (*)    RDW 17.6 (*)    Neutro Abs  9.8 (*)    Lymphs Abs 0.4 (*)    Monocytes Absolute 1.1 (*)    All other components within normal limits  PROTIME-INR - Abnormal; Notable for the following components:   Prothrombin Time 16.9 (*)    INR 1.4 (*)    All other components within normal limits  APTT - Abnormal; Notable for the following components:   aPTT 43 (*)    All other components within normal limits  BRAIN NATRIURETIC PEPTIDE - Abnormal; Notable for the following components:   B Natriuretic Peptide 2,556.7 (*)    All other components within normal limits  TROPONIN I (HIGH SENSITIVITY) - Abnormal; Notable for the following components:   Troponin I (High Sensitivity) 53 (*)    All other components within normal limits  RESP PANEL BY RT-PCR (FLU A&B, COVID) ARPGX2  CULTURE, BLOOD (ROUTINE X 2)  CULTURE, BLOOD (ROUTINE X 2)  URINE CULTURE  LACTIC ACID, PLASMA  LACTIC ACID, PLASMA  URINALYSIS, COMPLETE (UACMP) WITH MICROSCOPIC  POC SARS CORONAVIRUS 2 AG -  ED  SAMPLE TO BLOOD BANK  TROPONIN I (HIGH SENSITIVITY)    ____________________________________________  EKG: Sinus rhythm, ventricular rate 82.  QRS 152, QTc 487.  No acute ST elevations or depressions.  No EKG evidence of ischemia or infarct. ________________________________________  RADIOLOGY All imaging, including plain films, CT scans, and ultrasounds, independently reviewed by me, and interpretations confirmed via formal radiology reads.  ED MD interpretation:   Chest x-ray: Multifocal airspace opacities consistent with ARDS versus atypical pneumonia versus CHF  Official radiology report(s): DG Chest Port 1 View  Result Date: 10/04/2020 CLINICAL DATA:  Questionable sepsis. EXAM: PORTABLE CHEST 1 VIEW COMPARISON:  10/11/2020 FINDINGS: There is a left chest wall ICD with lead in the right ventricle. Previous median sternotomy and CABG procedure. Stable cardiac enlargement. Bilateral pleural effusions noted, left greater than right. Extensive bilateral  interstitial and airspace densities have increased from previous exam. IMPRESSION: 1. Worsening aeration to both lungs. 2. Bilateral pleural effusions, left greater than right. Electronically Signed   By: Kerby Moors M.D.   On: 10/18/2020 18:39    ____________________________________________  PROCEDURES   Procedure(s) performed (including Critical Care):  .Critical Care Performed by: Duffy Bruce, MD Authorized by: Duffy Bruce, MD   Critical care provider statement:    Critical care time (minutes):  35   Critical care time was exclusive of:  Separately billable procedures and treating other patients and teaching time   Critical care was necessary to treat or prevent imminent or life-threatening deterioration of the following conditions:  Cardiac failure, circulatory failure, respiratory failure and sepsis   Critical care was time spent personally by me on the following activities:  Development of treatment plan with patient or surrogate, discussions with consultants, evaluation of patient's response to treatment, examination of patient, obtaining history from patient or surrogate, ordering and performing treatments and interventions, ordering and review of laboratory studies, ordering and review of radiographic studies, pulse oximetry, re-evaluation of patient's condition and review of old charts   I assumed direction of critical care for this patient from another provider in my specialty: no   .1-3 Lead EKG Interpretation Performed by: Duffy Bruce, MD Authorized by: Duffy Bruce, MD     Interpretation: normal     ECG rate:  70-90   ECG rate assessment: normal  Rhythm: sinus rhythm     Ectopy: none     Conduction: normal   Comments:     Indication: SOB    ____________________________________________  INITIAL IMPRESSION / MDM / ASSESSMENT AND PLAN / ED COURSE  As part of my medical decision making, I reviewed the following data within the electronic medical  record:  Nursing notes reviewed and incorporated, Old chart reviewed, Notes from prior ED visits, and [place] Controlled Substance Database       *Carl [name] was evaluated in Emergency Department on 10/06/2020 for the symptoms described in the history of present illness. He was evaluated in the context of the global COVID-19 pandemic, which necessitated consideration that the patient might be at risk for infection with the SARS-CoV-2 virus that causes COVID-19. Institutional protocols and algorithms that pertain to the evaluation of patients at risk for COVID-19 are in a state of rapid change based on information released by regulatory bodies including the CDC and federal and state organizations. These policies and algorithms were followed during the patient's care in the ED.  Some ED evaluations and interventions may be delayed as a result of limited staffing during the pandemic.*     Medical Decision Making: 70 year old male here with fever and respiratory failure.  Patient was just hospitalized following admission for GI bleed.  Suspect multifactorial acute hypoxic respiratory failure secondary to pneumonia, differential includes Covid, CHF, though fever suggest an acute infectious trigger.  Lab work shows leukocytosis consistent with infection.  Lactic acid is normal.  He has a recurrent AKI on his CMP.  BNP is markedly elevated.  Is an EF of 20 to 25%.  Patient has been chronically ill and declining since his admission.  Discussed with daughter.  Will place on BiPAP for work of breathing and admit for noninvasive care at this time.  Patient may ultimately be a candidate for hospice or full palliative care.  He is DNR.  ____________________________________________  FINAL CLINICAL IMPRESSION(S) / ED DIAGNOSES  Final diagnoses:  Acute respiratory failure with hypoxia ([place])     MEDICATIONS GIVEN DURING THIS VISIT:  Medications  midodrine (PROAMATINE) tablet 5 mg (5 mg Oral Not Given 10/18/2020  1950)  vancomycin (VANCOCIN) IVPB 1000 mg/200 mL premix (0 mg Intravenous Stopped 09/25/2020 1931)  ceFEPIme (MAXIPIME) 2 g in sodium chloride 0.9 % 100 mL IVPB (0 g Intravenous Stopped 10/11/2020 1931)  acetaminophen (TYLENOL) suppository 650 mg (650 mg Rectal Given 09/29/2020 1814)  lactated ringers bolus 250 mL (0 mLs Intravenous Stopped 09/26/2020 1931)     ED Discharge Orders    None       Note:  This document was prepared using Dragon voice recognition software and may include unintentional dictation errors.   Duffy Bruce, MD 10/10/2020 2016

## 2020-10-17 NOTE — Progress Notes (Signed)
   10/14/2020 1755  Clinical Encounter Type  Visited With Family  Visit Type Initial  Referral From Family  Consult/Referral To Chaplain  Spiritual Encounters  Spiritual Needs Prayer   As the on-call chaplain, I responded to a page from Mr. Luana Shu nurse. She let me know the family requested to see me. I went and spoke with the family and had prayer.  [place], [place]

## 2020-10-17 NOTE — Plan of Care (Signed)
Patient admitted from ED with acute respiratory distress, sepsis, likely CHF. RT called for BiPAP. Trending labs per orders.    Problem: Education: Goal: Knowledge of General Education information will improve Description: Including pain rating scale, medication(s)/side effects and non-pharmacologic comfort measures Outcome: Not Progressing   Problem: Health Behavior/Discharge Planning: Goal: Ability to manage health-related needs will improve Outcome: Not Progressing   Problem: Clinical Measurements: Goal: Ability to maintain clinical measurements within normal limits will improve Outcome: Not Progressing Goal: Will remain free from infection Outcome: Not Progressing Goal: Diagnostic test results will improve Outcome: Not Progressing Goal: Respiratory complications will improve Outcome: Not Progressing Goal: Cardiovascular complication will be avoided Outcome: Not Progressing   Problem: Activity: Goal: Risk for activity intolerance will decrease Outcome: Not Progressing   Problem: Nutrition: Goal: Adequate nutrition will be maintained Outcome: Not Progressing   Problem: Coping: Goal: Level of anxiety will decrease Outcome: Not Progressing   Problem: Elimination: Goal: Will not experience complications related to bowel motility Outcome: Not Progressing Goal: Will not experience complications related to urinary retention Outcome: Not Progressing   Problem: Pain Managment: Goal: General experience of comfort will improve Outcome: Not Progressing   Problem: Safety: Goal: Ability to remain free from injury will improve Outcome: Not Progressing   Problem: Skin Integrity: Goal: Risk for impaired skin integrity will decrease Outcome: Not Progressing

## 2020-10-17 NOTE — Consult Note (Signed)
PHARMACY -  BRIEF ANTIBIOTIC NOTE   Pharmacy has received consult(s) for vancomycin and cefepime from an ED provider.  The patient's profile has been reviewed for ht/wt/allergies/indication/available labs.    One time order(s) placed for --Vancomycin 1 g (already ordered) --Cefepime 2 g (already ordered)  Further antibiotics/pharmacy consults should be ordered by admitting physician if indicated.                       Thank you, Benita Gutter 09/26/2020  5:45 PM

## 2020-10-18 DIAGNOSIS — Z7189 Other specified counseling: Secondary | ICD-10-CM | POA: Diagnosis not present

## 2020-10-18 DIAGNOSIS — I959 Hypotension, unspecified: Secondary | ICD-10-CM | POA: Diagnosis not present

## 2020-10-18 DIAGNOSIS — D649 Anemia, unspecified: Secondary | ICD-10-CM

## 2020-10-18 DIAGNOSIS — I25118 Atherosclerotic heart disease of native coronary artery with other forms of angina pectoris: Secondary | ICD-10-CM

## 2020-10-18 DIAGNOSIS — I480 Paroxysmal atrial fibrillation: Secondary | ICD-10-CM

## 2020-10-18 DIAGNOSIS — I10 Essential (primary) hypertension: Secondary | ICD-10-CM

## 2020-10-18 DIAGNOSIS — A419 Sepsis, unspecified organism: Secondary | ICD-10-CM | POA: Diagnosis not present

## 2020-10-18 DIAGNOSIS — I5020 Unspecified systolic (congestive) heart failure: Secondary | ICD-10-CM

## 2020-10-18 DIAGNOSIS — Z951 Presence of aortocoronary bypass graft: Secondary | ICD-10-CM

## 2020-10-18 DIAGNOSIS — E43 Unspecified severe protein-calorie malnutrition: Secondary | ICD-10-CM

## 2020-10-18 DIAGNOSIS — R627 Adult failure to thrive: Secondary | ICD-10-CM | POA: Diagnosis not present

## 2020-10-18 DIAGNOSIS — K7460 Unspecified cirrhosis of liver: Secondary | ICD-10-CM

## 2020-10-18 DIAGNOSIS — Z515 Encounter for palliative care: Secondary | ICD-10-CM | POA: Diagnosis not present

## 2020-10-18 LAB — COMPREHENSIVE METABOLIC PANEL
ALT: 28 U/L (ref 0–44)
AST: 35 U/L (ref 15–41)
Albumin: 3 g/dL — ABNORMAL LOW (ref 3.5–5.0)
Alkaline Phosphatase: 133 U/L — ABNORMAL HIGH (ref 38–126)
Anion gap: 8 (ref 5–15)
BUN: 30 mg/dL — ABNORMAL HIGH (ref 8–23)
CO2: 25 mmol/L (ref 22–32)
Calcium: 8.7 mg/dL — ABNORMAL LOW (ref 8.9–10.3)
Chloride: 100 mmol/L (ref 98–111)
Creatinine, Ser: 1.98 mg/dL — ABNORMAL HIGH (ref 0.61–1.24)
GFR, Estimated: 36 mL/min — ABNORMAL LOW (ref >60)
Glucose, Bld: 96 mg/dL (ref 70–99)
Potassium: 3.9 mmol/L (ref 3.5–5.1)
Sodium: 133 mmol/L — ABNORMAL LOW (ref 135–145)
Total Bilirubin: 1.9 mg/dL — ABNORMAL HIGH (ref 0.3–1.2)
Total Protein: 6.8 g/dL (ref 6.5–8.1)

## 2020-10-18 LAB — CBC WITH DIFFERENTIAL/PLATELET
Abs Immature Granulocytes: 0.06 10*3/uL (ref 0.00–0.07)
Basophils Absolute: 0.1 10*3/uL (ref 0.0–0.1)
Basophils Relative: 1 %
Eosinophils Absolute: 0.1 10*3/uL (ref 0.0–0.5)
Eosinophils Relative: 1 %
HCT: 28.9 % — ABNORMAL LOW (ref 39.0–52.0)
Hemoglobin: 9.5 g/dL — ABNORMAL LOW (ref 13.0–17.0)
Immature Granulocytes: 1 %
Lymphocytes Relative: 4 %
Lymphs Abs: 0.4 10*3/uL — ABNORMAL LOW (ref 0.7–4.0)
MCH: 31.7 pg (ref 26.0–34.0)
MCHC: 32.9 g/dL (ref 30.0–36.0)
MCV: 96.3 fL (ref 80.0–100.0)
Monocytes Absolute: 1 10*3/uL (ref 0.1–1.0)
Monocytes Relative: 9 %
Neutro Abs: 9.7 10*3/uL — ABNORMAL HIGH (ref 1.7–7.7)
Neutrophils Relative %: 84 %
Platelets: 348 10*3/uL (ref 150–400)
RBC: 3 MIL/uL — ABNORMAL LOW (ref 4.22–5.81)
RDW: 17.5 % — ABNORMAL HIGH (ref 11.5–15.5)
WBC: 11.4 10*3/uL — ABNORMAL HIGH (ref 4.0–10.5)
nRBC: 0 % (ref 0.0–0.2)

## 2020-10-18 LAB — PROTIME-INR
INR: 1.4 — ABNORMAL HIGH (ref 0.8–1.2)
Prothrombin Time: 16.6 seconds — ABNORMAL HIGH (ref 11.4–15.2)

## 2020-10-18 LAB — MRSA PCR SCREENING: MRSA by PCR: NEGATIVE

## 2020-10-18 LAB — PROCALCITONIN: Procalcitonin: 0.54 ng/mL

## 2020-10-18 LAB — GLUCOSE, CAPILLARY: Glucose-Capillary: 96 mg/dL (ref 70–99)

## 2020-10-18 MED ORDER — FUROSEMIDE 10 MG/ML IJ SOLN
40.0000 mg | Freq: Every day | INTRAMUSCULAR | Status: DC
  Administered 2020-10-19: 40 mg via INTRAVENOUS
  Filled 2020-10-18: qty 4

## 2020-10-18 MED ORDER — SODIUM CHLORIDE 0.9 % IV SOLN
2.0000 g | INTRAVENOUS | Status: DC
  Filled 2020-10-18: qty 2

## 2020-10-18 MED ORDER — FUROSEMIDE 10 MG/ML IJ SOLN
40.0000 mg | Freq: Once | INTRAMUSCULAR | Status: AC
  Administered 2020-10-18: 40 mg via INTRAVENOUS
  Filled 2020-10-18: qty 4

## 2020-10-18 MED ORDER — VANCOMYCIN HCL 1250 MG/250ML IV SOLN: 1250.0000 mg | INTRAVENOUS | Status: DC

## 2020-10-18 MED ORDER — SODIUM CHLORIDE 0.9 % IV BOLUS: 250.0000 mL | Freq: Once | INTRAVENOUS | Status: DC

## 2020-10-18 MED ORDER — SODIUM CHLORIDE 0.9 % IV SOLN
1.0000 g | INTRAVENOUS | Status: DC
  Administered 2020-10-18 – 2020-10-19 (×2): 1 g via INTRAVENOUS
  Filled 2020-10-18: qty 10
  Filled 2020-10-18: qty 1
  Filled 2020-10-18: qty 10

## 2020-10-18 NOTE — Consult Note (Signed)
Cardiology Consultation:   Patient ID: TREJON DUFORD MRN: [id number]; DOB: [birth year]  Admit date: 09/25/2020 Date of Consult: 10/18/2020  PCP:  [name], MD   [facility]: Cardiologist:  Advanced CHF team Advanced Practice Provider:  No care team member to display Electrophysiologist:  None    Patient Profile:   Carl [name] is a 70 y.o. male with a hx of smoking, mild mental retardation, CAD status post CABG x5 in 2015 with LIMA to LAD, SVG to D1, sequential SVG to OM2/OM 3, SVG to PDA, ischemic cardiomyopathy with EF of low 15% status post ICD insertion, MR s/p MitraClip, CKD stage III, melena, left hip fracture, colonic AVMs, cirrhosis who is being seen today for the evaluation of CHF at the request of Dr. Reesa Chew.  History of Present Illness:   Mr. Schumm is followed by the advanced heart failure team.  The patient has a history of smoking and mild retardation was admitted to [facility] in 07/2014 with dyspnea.  Left heart cath showed three-vessel disease with EF of 50%.  Echo showed EF 15 to 20%.  Patient underwent CABG x5.  It was difficult to wean off pressors but later started on midodrine.  Echo in 09/2016 showed EF 30 to 35%, mild LV dilation, normal wall motion abnormality, moderate MR, mild to moderate decrease RV systolic dysfunction.  Cardiolite to 2018 showed large inferior infarct with no ischemia.  Patient was admitted to [facility] 05/2017 with acute heart failure.  Discharge weight was 175 pounds.  Echo 05/2017 showed EF 20 to 25% with severe MR, possible ischemic MR.  TEE was done 01/2018 which showed EF 20 to 25%, confirmed severe ischemic MR with restricted posterior leaflet.  He was seen by structural team for MitraClip insertion.  Right and left heart cath 12/13/2018 showed patent grafts and elevated R>L filling pressures with preserved cardiac output.  He had MitraClip placed successfully 01/12/2019.  Given relatively sizable ASD after procedure he had  Amplatzer device closure.  Echo postop showed EF 25 to 30%, severe RV systolic dysfunction, mild MR/MS mean gradient 4 mmHg, moderate to severe TR, severe biatrial enlargement.  Echo 02/12/2019 showed EF 20 to 25% with similar mean gradient as before.  Losartan stopped due to AKI.   Patient underwent cardioversion in 11/12/2019 and again in 12/13/2019 for A. fib.  TEE 12/13/2019 showed EF 2025%, moderate LV enlargement, mild RV enlargement with moderately decreased systolic function, moderate to severe TR, mitral clips with moderate MR and minimal MS.  In November 2021 the patient was admitted for symptomatic anemia and required transfusion.  EGD was unremarkable and colonoscopy showed 2 polyps.  Also had AKI. Torsemide later restarted.  Patient was admitted to 2/14-2/22 with GI bleed and shock requiring pressors c/b AKI cirrhosis, encephalopathy, junctional bradycardia  He was seen by cardiology for CHF.  He was in sinus rhythm.  No beta-blocker with bradycardia and hypotension.  Not a candidate for anticoagulation for GI bleed.  Anticoagulation was held. He sas not sent home on diuretic.   Patient returned to the ER 10/13/2020 with hypoxia and AMS.  He was febrile and minimally responsive.  NP elevated to 556.  He was hypotensive.  Checks x-ray with bilateral pleural effusions and no infiltrate, blood cultures negative so far. Patient was diagnosed with severe sepsis and AKI. Patient was admitted to the ICU.   Past Medical History:  Diagnosis Date  . AICD (automatic cardioverter/defibrillator) present   . Anemia   .  Anxiety   . Arthritis   . Brunner's gland hyperplasia of duodenum   . CHF (congestive heart failure) ([place])   . Chronic kidney disease   . Cirrhosis of liver ([place])   . Coronary artery disease   . Depression   . Dysrhythmia    atrial fib  . External hemorrhoids   . Fracture of femoral neck, left ([place]) 07/21/2018  . GERD (gastroesophageal reflux disease)   . Gout   . Hearing loss    . Heart murmur    mirtal valve insufficiency  . Hepatic failure ([place])   . HH (hiatus hernia)   . Hyperlipidemia   . Hypokalemia   . Hyposmolality   . Intellectual disability    mild  . Internal hemorrhoids   . Ischemic cardiomyopathy   . Leg fracture, left   . Myocardial infarction ([place])   . Osteoporosis   . Paronychia   . Peripheral vascular disease ([place])   . Pneumonia   . Psoriasis   . Psoriasis   . PUD (peptic ulcer disease)   . Schatzki's ring   . Tubular adenoma of colon   . Vitiligo     Past Surgical History:  Procedure Laterality Date  . ATRIAL SEPTAL DEFECT(ASD) CLOSURE N/A 12/30/2018   Procedure: ATRIAL SEPTAL DEFECT(ASD) CLOSURE;  Surgeon: [name], MD;  Location: [place] CV LAB;  Service: Cardiovascular;  Laterality: N/A;  . CARDIAC SURGERY     ICD  . CARDIOVERSION N/A 11/09/2019   Procedure: CARDIOVERSION;  Surgeon: [name], MD;  Location: [facility] ENDOSCOPY;  Service: Cardiovascular;  Laterality: N/A;  . CARDIOVERSION N/A 12/21/2019   Procedure: CARDIOVERSION;  Surgeon: [name], MD;  Location: [place];  Service: Cardiovascular;  Laterality: N/A;  . CATARACT EXTRACTION W/PHACO Right 07/26/2020   Procedure: CATARACT EXTRACTION PHACO AND INTRAOCULAR LENS PLACEMENT ([place]) RIGHT VISION BLUE;  Surgeon: Birder Robson, MD;  Location: [facility] ORS;  Service: Ophthalmology;  Laterality: Right;  [place] 01:14.1 CDE 13.65 Fluid Pack Lot # [id number]  . COLONOSCOPY WITH ESOPHAGOGASTRODUODENOSCOPY (EGD)    . COLONOSCOPY WITH PROPOFOL N/A 08/24/2017   Procedure: COLONOSCOPY WITH PROPOFOL;  Surgeon: [name], MD;  Location: [facility] ENDOSCOPY;  Service: Endoscopy;  Laterality: N/A;  . COLONOSCOPY WITH PROPOFOL N/A 07/23/2020   Procedure: COLONOSCOPY WITH PROPOFOL;  Surgeon: Toledo, [name], MD;  Location: [facility] ENDOSCOPY;  Service: Gastroenterology;  Laterality: N/A;  . CORONARY ARTERY BYPASS GRAFT N/A 08/03/2014   Procedure: CORONARY ARTERY BYPASS  GRAFTING (CABG);  Surgeon: [name], MD;  Location: [place];  Service: Open Heart Surgery;  Laterality: N/A;  . ENTEROSCOPY N/A 08/06/2020   Procedure: ENTEROSCOPY;  Surgeon: Jonathon Bellows, MD;  Location: [facility] ENDOSCOPY;  Service: Gastroenterology;  Laterality: N/A;  . EP IMPLANTABLE DEVICE N/A 05/01/2015   MDT ICD implanted for primary prevention of sudden death  . ESOPHAGOGASTRODUODENOSCOPY N/A 06/28/2020   Procedure: ESOPHAGOGASTRODUODENOSCOPY (EGD);  Surgeon: Clarene Essex, MD;  Location: [place];  Service: Endoscopy;  Laterality: N/A;  . ESOPHAGOGASTRODUODENOSCOPY (EGD) WITH PROPOFOL N/A 04/16/2015   Procedure: ESOPHAGOGASTRODUODENOSCOPY (EGD) WITH PROPOFOL;  Surgeon: [name], MD;  Location: [facility] ENDOSCOPY;  Service: Endoscopy;  Laterality: N/A;  . ESOPHAGOGASTRODUODENOSCOPY (EGD) WITH PROPOFOL N/A 08/24/2017   Procedure: ESOPHAGOGASTRODUODENOSCOPY (EGD) WITH PROPOFOL;  Surgeon: [name], MD;  Location: [facility] ENDOSCOPY;  Service: Endoscopy;  Laterality: N/A;  . FRACTURE SURGERY Left    ORIF lower leg  . GIVENS CAPSULE STUDY N/A 08/03/2020   Procedure: GIVENS CAPSULE STUDY;  Surgeon: Jonathon Bellows, MD;  Location: [facility] ENDOSCOPY;  Service: Gastroenterology;  Laterality: N/A;  . HERNIA REPAIR    . HIP PINNING,CANNULATED Left 07/22/2018   Procedure: CANNULATED HIP PINNING;  Surgeon: [name], MD;  Location: [place];  Service: Orthopedics;  Laterality: Left;  . MITRAL VALVE REPAIR N/A 12/30/2018   Procedure: MITRAL VALVE REPAIR;  Surgeon: [name], MD;  Location: [place] CV LAB;  Service: Cardiovascular;  Laterality: N/A;  . RIGHT/LEFT HEART CATH AND CORONARY/GRAFT ANGIOGRAPHY N/A 12/21/2018   Procedure: RIGHT/LEFT HEART CATH AND CORONARY/GRAFT ANGIOGRAPHY;  Surgeon: [name], MD;  Location: [place] CV LAB;  Service: Cardiovascular;  Laterality: N/A;  . TEE WITHOUT CARDIOVERSION N/A 08/03/2014   Procedure: TRANSESOPHAGEAL ECHOCARDIOGRAM (TEE);   Surgeon: [name], MD;  Location: [place];  Service: Open Heart Surgery;  Laterality: N/A;  . TEE WITHOUT CARDIOVERSION N/A 02/10/2018   Procedure: TRANSESOPHAGEAL ECHOCARDIOGRAM (TEE);  Surgeon: [name], MD;  Location: [facility] ENDOSCOPY;  Service: Cardiovascular;  Laterality: N/A;  . TEE WITHOUT CARDIOVERSION N/A 11/09/2019   Procedure: TRANSESOPHAGEAL ECHOCARDIOGRAM (TEE);  Surgeon: [name], MD;  Location: [facility] ENDOSCOPY;  Service: Cardiovascular;  Laterality: N/A;     Home Medications:  Prior to Admission medications   Medication Sig Start Date End Date Taking? Authorizing Provider  allopurinol (ZYLOPRIM) 100 MG tablet Take 100 mg by mouth daily.  02/08/19  Yes [provider]  amiodarone (PACERONE) 200 MG tablet Take 1 tablet (200 mg total) by mouth daily. 04/13/20  Yes Bensimhon, Shaune Pascal, MD  DULoxetine (CYMBALTA) 30 MG capsule Take 30 mg by mouth at bedtime.    Yes [provider]  ferrous sulfate 325 (65 FE) MG tablet Take 1 tablet (325 mg total) by mouth daily with breakfast. 02/09/18  Yes [name], MD  gabapentin (NEURONTIN) 300 MG capsule Take 300 mg by mouth daily. 08/23/20  Yes [provider]  ketoconazole (NIZORAL) 2 % shampoo Apply 1 application topically 2 (two) times a week. Tues, Friday 09/28/18  Yes [provider]  Nutritional Supplements (ENSURE COMPLETE SHAKE) LIQD Take 1 Can by mouth 3 (three) times daily. Between meals 08/27/20  Yes Simmons, Brittainy M, PA-C  pantoprazole (PROTONIX) 40 MG tablet TAKE 1 TABLET BY MOUTH EVERY DAY 08/06/20  Yes [name], MD  sucralfate (CARAFATE) 1 g tablet Take 1 tablet (1 g total) by mouth 2 (two) times daily. 07/05/20  Yes Gaylan Gerold, DO  zinc oxide (BALMEX) 11.3 % CREA cream Apply 1 application topically 2 (two) times daily. Apply to butt   Yes [provider]    Inpatient Medications: Scheduled Meds: . amiodarone  200 mg Oral Daily  . chlorhexidine  15 mL Mouth  Rinse BID  . Chlorhexidine Gluconate Cloth  6 each Topical Daily  . DULoxetine  30 mg Oral QHS  . ferrous sulfate  325 mg Oral Q breakfast  . mouth rinse  15 mL Mouth Rinse q12n4p  . midodrine  5 mg Oral TID WC  . pantoprazole  40 mg Oral Daily  . sucralfate  1 g Oral BID   Continuous Infusions: . cefTRIAXone (ROCEPHIN)  IV    . sodium chloride     PRN Meds: acetaminophen **OR** acetaminophen, ondansetron **OR** ondansetron (ZOFRAN) IV  Allergies:    Allergies  Allergen Reactions  . Spironolactone Other (See Comments)    gynecomastia     Social History:   Social History   Socioeconomic History  . Marital status:  Single    Spouse name: Not on file  . Number of children: Not on file  . Years of education: Not on file  . Highest education level: Not on file  Occupational History  . Not on file  Tobacco Use  . Smoking status: Former Smoker    Quit date: 2014    Years since quitting: 8.1  . Smokeless tobacco: Never Used  . Tobacco comment: 07/30/2017 Quit in 2014  Vaping Use  . Vaping Use: Never used  Substance and Sexual Activity  . Alcohol use: No    Alcohol/week: 0.0 standard drinks  . Drug use: No  . Sexual activity: Never  Other Topics Concern  . Not on file  Social History Narrative   Pt lives in [place] with his mother.   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 Valvular heart disease Mother        Ruptured valve  . CAD Father   . Heart Problems Brother        Stents x 4  . Diabetes Brother   . Prostate cancer Neg Hx   . Bladder Cancer Neg Hx   . Kidney cancer Neg Hx      ROS:  Please see the history of  present illness.  All other ROS reviewed and negative.     Physical Exam/Data:   Vitals:   10/18/20 0600 10/18/20 0800 10/18/20 1100 10/18/20 1200  BP: 94/60 105/69 98/66 101/66  Pulse: 72 67 73 81  Resp: (!) 26 19 (!) 27 (!) 25  Temp:  97.9 F (36.6 C)    TempSrc:  Axillary    SpO2: 100% 100% 98% 90%  Weight:      Height:        Intake/Output Summary (Last 24 hours) at 10/18/2020 1600 Last data filed at 10/18/2020 1522 Gross per 24 hour  Intake 294.47 ml  Output 1001 ml  Net -706.53 ml   Last 3 Weights 10/16/2020 09/30/2020 10/14/2020  Weight (lbs) 126 lb 12.2 oz 171 lb 11.8 oz 147 lb 9.6 oz  Weight (kg) 57.5 kg 77.9 kg 66.951 kg     Body mass index is 18.19 kg/m.  General:  Frail WM HEENT: normal Lymph: no adenopathy Neck: + JVD Endocrine:  No thryomegaly Vascular: No carotid bruits; FA pulses 2+ bilaterally without bruits  Cardiac:  normal S1, S2; RRR; + murmur  Lungs:  Diminished at bases; supplemental O2 Abd: soft, nontender, no hepatomegaly  Ext: no edema Musculoskeletal:  No deformities, BUE and BLE strength normal and equal Skin: warm and dry  Neuro:  CNs 2-12 intact, no focal abnormalities noted Psych:  Normal affect   EKG:  The EKG was personally reviewed and demonstrates:  SR, RBBB,possible first degree AV block, 82 bpm Telemetry:  Telemetry was personally reviewed and demonstrates:  SR, HR 80s, PVCs  Relevant CV Studies:  TEE (11/09/2019): 1. Left ventricular ejection fraction, by estimation, is 20 to 25%. The  left ventricle has severely decreased function. The left ventricle  demonstrates global hypokinesis. The left ventricular internal cavity size  was moderately dilated.  2. Right ventricular  systolic function is moderately reduced. The right  ventricular size is mildly enlarged. There is mildly elevated pulmonary  artery systolic pressure.  3. There was an Amplatzer atrial septal closure device present, no  shunting noted by color doppler.  Left atrial size was moderately dilated.  No left atrial/left atrial appendage thrombus was detected.  4. Right atrial size was moderately dilated.  5. Peak RV-RA gradient 24 mmHg. Tricuspid valve regurgitation is moderate  to severe.  6. The aortic valve is tricuspid. Aortic valve regurgitation is trivial.  No aortic stenosis is present.  7. The mitral valve was s/p repair with 2 Mitraclip devices placed in the  A2-P2 area. There was moderate residual MR with PISA ERO 0.25 cm^2. Mean  gradient 3 mmHg across the mitral valve, no significant stenosis. No  pulmonary vein systolic flow reversal  noted.  8. Normal caliber thoracic aorta with mild plaque.  R/LHC 11/2018: 1. Severe native vessel CAD with total occlusion of the RCA, LAD, and left circumflex and severe distal left main stenosis 2. Status post multivessel CABG with continued patency of the LIMA to LAD, saphenous vein graft to first diagonal, sequential saphenous vein graft to OM 2 and OM 3, and saphenous vein graft to PDA 3. Preserved cardiac output 4. Large V waves consistent with severe mitral regurgitation  Laboratory Data:  High Sensitivity Troponin:   Recent Labs  Lab 10/08/20 1244 10/16/2020 1722 09/29/2020 2107  TROPONINIHS 35* 53* 58*     Chemistry Recent Labs  Lab 10/14/20 0327 10/09/2020 1722 10/18/20 0546  NA 135 132* 133*  K 3.9 4.3 3.9  CL 103 98 100  CO2 23 24 25   GLUCOSE 94 107* 96  BUN 23 28* 30*  CREATININE 1.29* 1.95* 1.98*  CALCIUM 8.7* 9.0 8.7*  GFRNONAA >60 37* 36*  ANIONGAP 9 10 8     Recent Labs  Lab 09/26/2020 1722 10/18/20 0546  PROT 7.1 6.8  ALBUMIN 3.1* 3.0*  AST 36 35  ALT 30 28  ALKPHOS 133* 133*  BILITOT 1.6* 1.9*   Hematology Recent Labs  Lab 10/14/20 0327 10/02/2020 1722 10/18/20 0546  WBC 5.4 11.5* 11.4*  RBC 2.74* 2.84* 3.00*  HGB 8.4* 8.7* 9.5*  HCT 27.0* 27.4* 28.9*  MCV 98.5 96.5 96.3  MCH 30.7 30.6 31.7  MCHC 31.1 31.8 32.9  RDW 18.3* 17.6* 17.5*   PLT 297 345 348   BNP Recent Labs  Lab 10/10/2020 1723  BNP 2,556.7*    DDimer No results for input(s): DDIMER in the last 168 hours.   Radiology/Studies:  [facility] Chest Port 1 View  Result Date: 10/04/2020 CLINICAL DATA:  Questionable sepsis. EXAM: PORTABLE CHEST 1 VIEW COMPARISON:  10/11/2020 FINDINGS: There is a left chest wall ICD with lead in the right ventricle. Previous median sternotomy and CABG procedure. Stable cardiac enlargement. Bilateral pleural effusions noted, left greater than right. Extensive bilateral interstitial and airspace densities have increased from previous exam. IMPRESSION: 1. Worsening aeration to both lungs. 2. Bilateral pleural effusions, left greater than right. Electronically Signed   By: Kerby Moors M.D.   On: 09/25/2020 18:39     Assessment and Plan:   Sepsis/SIRS/AMS -Fever, leukocytosis, tachypneic and AKI on admission -Chest x-ray with no clear infection -BC so far negative.  Urine ordered -s/p minimal IV fluids -started on Midodrine -IV antibiotics per primary team  Acute respiratory failure -Was on Bipap, now on supplemental O2 -Suspected secondary to acute heart failure  Acute on chronic HFrEF Ischemic cardiomyopathy with  EF 20-25% -Hypotension has limited GDMT in the past. At the last discharge he was not sent on diuretic. -BNP significantly elevated, CXR with bilateral pleural effusions, L>R -Hypotensive on admission now on Midodrine 5mg  TID. Pressures still soft - Needs lasix. Can consider lasix drip, however might require addition of pressor. Will discuss with MD. Might need central line. Will defer [facility] transfer to MD. - No other CHF meds due to hypotension - Echo ordered  MR s/p MitraClip - Echo 10/2019 showed 2 mitraclip devices with moderate MR, mean gradient mmHg and no significant stenosis - Echo ordered   Paroxysmal A. Fib -Remains in sinus - No a/c with recent GIB - continue amiodarone  AKI - creatinine 1.98, was  normal at discharge  FTT - Palliative following - [place] is DNR   For questions or updates, please contact [place] Please consult [email or web address] for contact info under    Signed, Cadence Ninfa Meeker, PA-C  10/18/2020 4:00 PM

## 2020-10-18 NOTE — Progress Notes (Signed)
Clinical/Bedside Swallow Evaluation Patient Details  Name: Carl [name] MRN: [id number] Date of Birth: 02/15/1951  Today's Date: 10/18/2020 Time: SLP Start Time (ACUTE ONLY): 69 SLP Stop Time (ACUTE ONLY): 7741 SLP Time Calculation (min) (ACUTE ONLY): 30 min  Past Medical History:  Past Medical History:  Diagnosis Date   AICD (automatic cardioverter/defibrillator) present    Anemia    Anxiety    Arthritis    Brunner's gland hyperplasia of duodenum    CHF (congestive heart failure) (HCC)    Chronic kidney disease    Cirrhosis of liver (HCC)    Coronary artery disease    Depression    Dysrhythmia    atrial fib   External hemorrhoids    Fracture of femoral neck, left ([place]) 07/21/2018   GERD (gastroesophageal reflux disease)    Gout    Hearing loss    Heart murmur    mirtal valve insufficiency   Hepatic failure (HCC)    HH (hiatus hernia)    Hyperlipidemia    Hypokalemia    Hyposmolality    Intellectual disability    mild   Internal hemorrhoids    Ischemic cardiomyopathy    Leg fracture, left    Myocardial infarction ([place])    Osteoporosis    Paronychia    Peripheral vascular disease ([place])    Pneumonia    Psoriasis    Psoriasis    PUD (peptic ulcer disease)    Schatzki's ring    Tubular adenoma of colon    Vitiligo    Past Surgical History:  Past Surgical History:  Procedure Laterality Date   ATRIAL SEPTAL DEFECT(ASD) CLOSURE N/A 12/30/2018   Procedure: ATRIAL SEPTAL DEFECT(ASD) CLOSURE;  Surgeon: [name], MD;  Location: [place] [facility] LAB;  Service: Cardiovascular;  Laterality: N/A;   CARDIAC SURGERY     ICD   CARDIOVERSION N/A 11/09/2019   Procedure: CARDIOVERSION;  Surgeon: [name], MD;  Location: [facility] ENDOSCOPY;  Service: Cardiovascular;  Laterality: N/A;   CARDIOVERSION N/A 12/21/2019   Procedure: CARDIOVERSION;  Surgeon: [name], MD;  Location: [facility] ENDOSCOPY;  Service: Cardiovascular;  Laterality: N/A;   CATARACT EXTRACTION W/PHACO  Right 07/26/2020   Procedure: CATARACT EXTRACTION PHACO AND INTRAOCULAR LENS PLACEMENT ([place]) RIGHT VISION BLUE;  Surgeon: Birder Robson, MD;  Location: [facility] ORS;  Service: Ophthalmology;  Laterality: Right;  [place] 01:14.1 CDE 13.65 Fluid Pack Lot # [id number]   COLONOSCOPY WITH ESOPHAGOGASTRODUODENOSCOPY (EGD)     COLONOSCOPY WITH PROPOFOL N/A 08/24/2017   Procedure: COLONOSCOPY WITH PROPOFOL;  Surgeon: [name], MD;  Location: [facility] ENDOSCOPY;  Service: Endoscopy;  Laterality: N/A;   COLONOSCOPY WITH PROPOFOL N/A 07/23/2020   Procedure: COLONOSCOPY WITH PROPOFOL;  Surgeon: [place], [name], MD;  Location: [facility] ENDOSCOPY;  Service: Gastroenterology;  Laterality: N/A;   CORONARY ARTERY BYPASS GRAFT N/A 08/03/2014   Procedure: CORONARY ARTERY BYPASS GRAFTING (CABG);  Surgeon: [name], MD;  Location: [place];  Service: Open Heart Surgery;  Laterality: N/A;   ENTEROSCOPY N/A 08/06/2020   Procedure: ENTEROSCOPY;  Surgeon: Jonathon Bellows, MD;  Location: [facility] ENDOSCOPY;  Service: Gastroenterology;  Laterality: N/A;   EP IMPLANTABLE DEVICE N/A 05/01/2015   MDT ICD implanted for primary prevention of sudden death   ESOPHAGOGASTRODUODENOSCOPY N/A 06/28/2020   Procedure: ESOPHAGOGASTRODUODENOSCOPY (EGD);  Surgeon: Clarene Essex, MD;  Location: [place];  Service: Endoscopy;  Laterality: N/A;   ESOPHAGOGASTRODUODENOSCOPY (EGD) WITH PROPOFOL N/A 04/16/2015   Procedure: ESOPHAGOGASTRODUODENOSCOPY (EGD) WITH PROPOFOL;  Surgeon: [name],  MD;  Location: [facility] ENDOSCOPY;  Service: Endoscopy;  Laterality: N/A;   ESOPHAGOGASTRODUODENOSCOPY (EGD) WITH PROPOFOL N/A 08/24/2017   Procedure: ESOPHAGOGASTRODUODENOSCOPY (EGD) WITH PROPOFOL;  Surgeon: [name], MD;  Location: [facility] ENDOSCOPY;  Service: Endoscopy;  Laterality: N/A;   FRACTURE SURGERY Left    ORIF lower leg   GIVENS CAPSULE STUDY N/A 08/03/2020   Procedure: GIVENS CAPSULE STUDY;  Surgeon: Jonathon Bellows, MD;  Location: [facility]  ENDOSCOPY;  Service: Gastroenterology;  Laterality: N/A;   HERNIA REPAIR     HIP PINNING,CANNULATED Left 07/22/2018   Procedure: CANNULATED HIP PINNING;  Surgeon: [name], MD;  Location: [place];  Service: Orthopedics;  Laterality: Left;   MITRAL VALVE REPAIR N/A 12/30/2018   Procedure: MITRAL VALVE REPAIR;  Surgeon: [name], MD;  Location: [place] CV LAB;  Service: Cardiovascular;  Laterality: N/A;   RIGHT/LEFT HEART CATH AND CORONARY/GRAFT ANGIOGRAPHY N/A 12/21/2018   Procedure: RIGHT/LEFT HEART CATH AND CORONARY/GRAFT ANGIOGRAPHY;  Surgeon: [name], MD;  Location: [place] CV LAB;  Service: Cardiovascular;  Laterality: N/A;   TEE WITHOUT CARDIOVERSION N/A 08/03/2014   Procedure: TRANSESOPHAGEAL ECHOCARDIOGRAM (TEE);  Surgeon: [name], MD;  Location: [place];  Service: Open Heart Surgery;  Laterality: N/A;   TEE WITHOUT CARDIOVERSION N/A 02/10/2018   Procedure: TRANSESOPHAGEAL ECHOCARDIOGRAM (TEE);  Surgeon: [name], MD;  Location: [facility] ENDOSCOPY;  Service: Cardiovascular;  Laterality: N/A;   TEE WITHOUT CARDIOVERSION N/A 11/09/2019   Procedure: TRANSESOPHAGEAL ECHOCARDIOGRAM (TEE);  Surgeon: [name], MD;  Location: [facility] ENDOSCOPY;  Service: Cardiovascular;  Laterality: N/A;   HPI:  Patient is a 70 y.o. male admitted with hypoxia and AMS, severe sepsis and AKI. Past medical history noted for liver cirrhosis, GERD, chronic GI bleed and septic shock, hearing loss, heart failure, afib, HTN, CAD, depression, peripheral vascular disease. CXR 10/08/2020 showed bilateral pleural effusions, L greater than R, Extensive bilateral interstitial and airspace densities. Required BiPAP, now on 4L via [place]. EGD in 11/21 showed small hiatal hernia.   Assessment / Plan / Recommendation Clinical Impression   Patient presents with moderate risk for aspiration in the context of elevated respiratory rate which appears to impact swallowing/breathing reciprocity. While SLP present  in room, respiratory rate fluctuated from 21 up to 42, remaining above 30 ~50% of the time. When RR below 30, pt able to self feed cup sips of water and bites of puree, with SLP guiding and slowing patient for frequent rest breaks. When pt took multiple sips, exhibited delayed coughing x 2, suggestive of decreased airway protection. No history of oropharyngeal dysphagia per chart review, and expect pt would be able to advance to PO diet if respiratory status improves. Advise he remain NPO at this time, with exception of ice chips, sips of water, and bites of puree from floor stock with nsg/full supervision ONLY WHEN RR <30, meds crushed. Ensure upright position, reflux precautions, perform oral care prior to PO. SLP to follow up for possible advancement pending improvements in respiratory status.     SLP Visit Diagnosis: Dysphagia, oropharyngeal phase (R13.12)    Aspiration Risk  Moderate aspiration risk    Diet Recommendation NPO;Ice chips PRN after oral care;Free water protocol after oral care;Other (Comment) (bites of puree from floor stock with supervision)   Liquid Administration via: Cup;No straw Medication Administration: Crushed with puree Supervision: Full supervision/cueing for compensatory strategies Compensations: Minimize environmental distractions;Slow rate;Small sips/bites;Other (Comment) (rest breaks. no PO if RR >30) Postural Changes: Seated upright at 90 degrees;Remain upright  for at least 30 minutes after po intake    Other  Recommendations Oral Care Recommendations: Oral care QID;Oral care prior to ice chip/H20   Follow up Recommendations Other (comment) (tbd)      Frequency and Duration min 2x/week  2 weeks       Prognosis Prognosis for Safe Diet Advancement: Fair Barriers to Reach Goals: Other (Comment) (comorbidities)      Swallow Study   General Date of Onset: 10/08/2020 HPI: Patient is a 70 y.o. male admitted with hypoxia and AMS, severe sepsis and AKI. Past  medical history noted for liver cirrhosis, GERD, chronic GI bleed and septic shock, hearing loss, heart failure, afib, HTN, CAD, depression, peripheral vascular disease. CXR 10/13/2020 showed bilateral pleural effusions, L greater than R, Extensive bilateral interstitial and airspace densities. Required BiPAP, now on 4L via [place]. EGD in 11/21 showed small hiatal hernia. Type of Study: Bedside Swallow Evaluation Previous Swallow Assessment: none on file Diet Prior to this Study: NPO Temperature Spikes Noted: No Respiratory Status: Nasal cannula;Increased respiratory rate (4L) History of Recent Intubation: No Behavior/Cognition: Alert;Cooperative;Other (Comment) (difficulty following directions due to hearing loss) Oral Cavity Assessment: Dry;Erythema Oral Care Completed by SLP: Yes Oral Cavity - Dentition: Dentures, not available Vision: Functional for self-feeding Self-Feeding Abilities: Able to feed self Patient Positioning: Upright in bed Baseline Vocal Quality: Hoarse Volitional Cough: Strong Volitional Swallow: Able to elicit    Oral/Motor/Sensory Function Overall Oral Motor/Sensory Function: Generalized oral weakness   Ice Chips Ice chips: Impaired Presentation: Spoon Pharyngeal Phase Impairments: Cough - Immediate   Thin Liquid Thin Liquid: Impaired Presentation: Cup;Spoon;Self Fed Pharyngeal  Phase Impairments: Cough - Delayed Other Comments: cough after multiple sips    Nectar Thick Nectar Thick Liquid: Not tested   Honey Thick Honey Thick Liquid: Not tested   Puree Puree: Within functional limits Presentation: Self Fed;Spoon   Solid     Solid: Not tested     Deneise Lever, MS, CCC-SLP Speech-Language Pathologist  Aliene Altes 10/18/2020,5:07 PM

## 2020-10-18 NOTE — Consult Note (Signed)
Consultation Note Date: 10/18/2020   Patient Name: Carl [name]  DOB: 1950/08/29  MRN: [id number]  Age / Sex: 70 y.o., male  PCP: [name], MD Referring Physician: Lorella Nimrod, MD  Reason for Consultation: Establishing goals of care  HPI/Patient Profile: 70 y.o. male  with past medical history of GI bleed, acute kidney injury, recent admission for septic shock with chronic GI bleed, heart failure with reduced ejection fraction, paroxysmal atrial fibrillation, hypertension, depression, liver cirrhosis, coronary artery disease, GERD, hearing loss, tubular adenoma of the colon, and peripheral vascular disease admitted on  with hypoxia and AMS. Diagnosed with severe sepsis and AKI. PMT consulted to discuss [place].  Clinical Assessment and Goals of Care: I have reviewed medical records including EPIC notes, labs and imaging, received report from RN, assessed the patient and then spoke to patient's daughter Carl [name] to discuss diagnosis prognosis, [place], EOL wishes, disposition and options.  Patient is known to me from previous admission - I met with family 10/10/20 - at that time goals were for DNR and NO dialysis, family considering home with hospice but ultimately decided to go to SNF.   When speaking with Carl [name], she remembered our visit last week and understands role of palliative medicine in patient's care.    We discussed patient's current illness and what it means in the larger context of patient's on-going co-morbidities. Carl [name] has a good understanding of patient's condition. She is encouraged by his improvement overnight. She would like to continue current medical management and see how patient responds.  Carl [name] if hopeful for patient return to SNF. Carl [name] is not available to meet in person but is hopeful I can meet with patient's brother Carl [name] tomorrow. Hopeful to complete MOST form.   Discussed with patient/family the importance of  continued conversation with family and the medical providers regarding overall plan of care and treatment options, ensuring decisions are within the context of the patient's values and GOCs.    Questions and concerns were addressed. The family was encouraged to call with questions or concerns.   Primary Decision Maker NEXT OF KIN - daughter Carl [name], though patient has verbally appointed brother Carl [name] as Economist (no legal documentation), family seems to make decisions together    SUMMARY OF RECOMMENDATIONS   - continue current measures and allow time for outcomes - family hopeful for return to rehab - will continue to address [place], need to complete MOST prior to discharge  Code Status/Advance Care Planning:  DNR   Discharge Planning: To Be Determined      Primary Diagnoses: Present on Admission: . Sepsis associated hypotension ([place]) . Acute kidney injury superimposed on CKD ([place]) . AKI (acute kidney injury) ([place]) . CAD (coronary artery disease) . Chronic systolic CHF (congestive heart failure) ([place]) . Cirrhosis ([place]) . Depression . Essential hypertension . GERD (gastroesophageal reflux disease) . HFrEF (heart failure with reduced ejection fraction) ([place]) . Leukocytosis . Hyponatremia . Hyperlipidemia . Paroxysmal atrial fibrillation (HCC) . Protein-calorie malnutrition, severe ([place]) . Symptomatic anemia . Failure to thrive in adult   I have reviewed the medical record, interviewed the patient and family, and examined the patient. The following aspects are pertinent.  Past Medical History:  Diagnosis Date  . AICD (automatic cardioverter/defibrillator) present   . Anemia   . Anxiety   . Arthritis   . Brunner's gland hyperplasia of duodenum   . CHF (congestive heart failure) ([place])   . Chronic kidney disease   . Cirrhosis of liver ([place])   . Coronary  artery disease   . Depression   . Dysrhythmia    atrial fib  . External hemorrhoids   . Fracture of femoral neck, left  ([place]) 07/21/2018  . GERD (gastroesophageal reflux disease)   . Gout   . Hearing loss   . Heart murmur    mirtal valve insufficiency  . Hepatic failure ([place])   . HH (hiatus hernia)   . Hyperlipidemia   . Hypokalemia   . Hyposmolality   . Intellectual disability    mild  . Internal hemorrhoids   . Ischemic cardiomyopathy   . Leg fracture, left   . Myocardial infarction ([place])   . Osteoporosis   . Paronychia   . Peripheral vascular disease ([place])   . Pneumonia   . Psoriasis   . Psoriasis   . PUD (peptic ulcer disease)   . Schatzki's ring   . Tubular adenoma of colon   . Vitiligo    Social History   Socioeconomic History  . Marital status: Single    Spouse name: Not on file  . Number of children: Not on file  . Years of education: Not on file  . Highest education level: Not on file  Occupational History  . Not on file  Tobacco Use  . Smoking status: Former Smoker    Quit date: 2014    Years since quitting: 8.1  . Smokeless tobacco: Never Used  . Tobacco comment: 07/30/2017 Quit in 2014  Vaping Use  . Vaping Use: Never used  Substance and Sexual Activity  . Alcohol use: No    Alcohol/week: 0.0 standard drinks  . Drug use: No  . Sexual activity: Never  Other Topics Concern  . Not on file  Social History Narrative   Pt lives in [place] with his mother.   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 Valvular heart disease Mother        Ruptured valve  . CAD Father   . Heart Problems Brother        Stents x 4  . Diabetes Brother   . Prostate cancer Neg Hx   . Bladder Cancer Neg Hx   . Kidney cancer Neg Hx    Scheduled Meds: . amiodarone  200 mg Oral Daily  . chlorhexidine  15 mL  Mouth Rinse BID  . Chlorhexidine Gluconate Cloth  6 each Topical Daily  . DULoxetine  30 mg Oral QHS  . ferrous sulfate  325 mg Oral Q breakfast  . mouth rinse  15 mL Mouth Rinse q12n4p  . midodrine  5 mg Oral TID WC  . pantoprazole  40 mg Oral Daily  . sucralfate  1 g Oral BID   Continuous Infusions: . cefTRIAXone (ROCEPHIN)  IV    . sodium chloride     PRN Meds:.acetaminophen **OR** acetaminophen, ondansetron **OR** ondansetron (ZOFRAN) IV Allergies  Allergen Reactions  . Spironolactone Other (See Comments)    gynecomastia    Review of Systems  Unable to perform ROS: Mental status change    Physical Exam Constitutional:      Comments: Unintelligible speech  Pulmonary:     Effort: Pulmonary effort is normal. No respiratory distress.  Skin:  General: Skin is warm and dry.  Neurological:     Mental Status: He is alert. He is disoriented.  Psychiatric:        Attention and Perception: He is inattentive.        Behavior: Behavior is agitated.        Cognition and Memory: Cognition is impaired.        Judgment: Judgment is impulsive.     Vital Signs: BP 101/66   Pulse 81   Temp 97.9 F (36.6 C) (Axillary)   Resp (!) 25   Ht '5\' 10"'  (1.778 m)   Wt 57.5 kg   SpO2 90%   BMI 18.19 kg/m  Pain Scale: PAINAD   Pain Score: Asleep   SpO2: SpO2: 90 % O2 Device:SpO2: 90 % O2 Flow Rate: .O2 Flow Rate (L/min): 4 L/min  IO: Intake/output summary:   Intake/Output Summary (Last 24 hours) at 10/18/2020 1524 Last data filed at 10/18/2020 1522 Gross per 24 hour  Intake 294.47 ml  Output 1001 ml  Net -706.53 ml    LBM: Last BM Date: 10/15/20 Baseline Weight: Weight: 77.9 kg Most recent weight: Weight: 57.5 kg     Palliative Assessment/Data: PPS 30%    Time Total: 45 minutes Greater than 50%  of this time was spent counseling and coordinating care related to the above assessment and plan.  Juel Burrow, DNP, AGNP-C Palliative Medicine  Team [phone] Pager: [phone]

## 2020-10-18 NOTE — Progress Notes (Signed)
PROGRESS NOTE    Carl [name]  FGH:[id number] DOB: 01-10-1951 DOA: 10/04/2020 PCP: [name], MD   Brief Narrative: Taken from H&P. Carl [name] is a 70 y.o. male with medical history significant for previous GI bleed, acute kidney injury, recent admission for shock with chronic GI bleed, heart failure reduced ejection fraction, paroxysmal atrial fibrillation, history of hypertension, depression, liver cirrhosis, coronary artery disease, GERD, hearing loss, Schatzki's ring, tubular adenoma of the colon, peripheral vascular disease, osteoporosis, presents to the emergency department from [facility] for chief concerns of hypoxia on room air. On arrival patient was minimally responsive to sternal rub.  Appears very lethargic.  He was febrile at 101.9, procalcitonin at 0.54, elevated BNP at 2556, hypotensive, chest x-ray with bilateral pleural effusions and no infiltrate, blood cultures negative so far and urine cultures pending. Admitted with concern of sepsis and failure to thrive.  Subjective: Patient appears very lethargic and not following any commands, does not open eyes on sternal rub.  Patient was on BiPAP.  Assessment & Plan:   Principal Problem:   Sepsis associated hypotension (HCC) Active Problems:   Protein-calorie malnutrition, severe (HCC)   AKI (acute kidney injury) ([place])   S/P CABG x 5   Hyperlipidemia   Essential hypertension   CAD (coronary artery disease)   GERD (gastroesophageal reflux disease)   Depression   Symptomatic anemia   Chronic systolic CHF (congestive heart failure) (HCC)   Cirrhosis (HCC)   Hyponatremia   Acute kidney injury superimposed on CKD (HCC)   Leukocytosis   HFrEF (heart failure with reduced ejection fraction) (HCC)   Paroxysmal atrial fibrillation (HCC)   Failure to thrive in adult  Sepsis/SIRS.  Patient met sepsis criteria initially with fever, leukocytosis, tachypnea, AKI and encephalopathy.  Procalcitonin at 0.54.  Chest x-ray is  not very impressive for pneumonia.  Blood cultures negative at this time.  Urine cultures pending.  He was started on broad-spectrum antibiotics with cefepime and vancomycin.  Did not received much fluid with HFrEF and concern of pulmonary edema.  Unable to diurese to him due to softer blood pressure. He was also started on midodrine to keep the MAP greater than 65. No obvious source of infection at this time.  MRSA swab negative, COVID-19 negative. -Discontinue vancomycin -Switch cefepime with ceftriaxone-we will de-escalate once urinary culture results are available. -Continue with midodrine keep the map above 60 -Obtain swallow evaluation to rule out any aspiration  Acute hypoxic respiratory failure.  Currently requiring BiPAP.  Most likely secondary to acute on chronic HFrEF. -Continue with BiPAP-wean as tolerated.  HFrEF.  Patient has an history of severe ischemic cardiomyopathy and coronary artery disease s/p CABG and severe mitral valve regurgitation s/p mitral valve clip.  EF of 20 to 25%.  Not a candidate for any further aggressive measures.  His home dose of carvedilol, metolazone and torsemide were discontinued during prior hospitalization due to softer blood pressure. Patient appears in pulmonary edema.  BNP at 2559 -Try one-time dose of Lasix with midodrine if he can tolerate for comfort. -We will also consult cardiology  Failure to thrive.  Patient was recently discharged to SNF and at that time his heart failure medications were discontinued due to softer blood pressure.  Outpatient palliative care follow-up was recommended. -Palliative care consult-patient is appropriate for comfort measures/hospice care.  AKI.  Most likely secondary to cardiorenal. -Monitor renal function with gentle diuresis if he can tolerate. -Avoid nephrotoxins.  Liver cirrhosis.  Meld sodium score of 23.  Not a candidate for transplant. -Continue with supportive care  Paroxysmal atrial fibrillation.   Currently in sinus rhythm. -Continue home dose of amiodarone -Not a candidate for anticoagulation due to recent GI bleed.  Chronic anemia with recent GI bleed.  Anticoagulation was discontinued.  Hemoglobin stable at 9.5. -Continue to monitor  History of depression. -Continue home dose of Cymbalta  History of gout.  No acute concern. -Continue home dose of allopurinol.  GERD. -Continue Protonix  Objective: Vitals:   10/18/20 0600 10/18/20 0800 10/18/20 1100 10/18/20 1200  BP: 94/60 105/69 98/66 101/66  Pulse: 72 67 73 81  Resp: (!) 26 19 (!) 27 (!) 25  Temp:  97.9 F (36.6 C)    TempSrc:  Axillary    SpO2: 100% 100% 98% 90%  Weight:      Height:        Intake/Output Summary (Last 24 hours) at 10/18/2020 1440 Last data filed at 10/18/2020 1307 Gross per 24 hour  Intake 294.47 ml  Output 901 ml  Net -606.53 ml   Filed Weights   10/09/2020 1720 10/04/2020 2115  Weight: 77.9 kg 57.5 kg    Examination:  General exam: Lethargic gentleman who appears much older than his stated age, with BiPAP. Respiratory system: Bilateral basal crackles Cardiovascular system: S1 & S2 heard, RRR.  Gastrointestinal system: Soft, nontender, nondistended, bowel sounds positive. Central nervous system: Somnolent, not following any commands Extremities: No edema, no cyanosis, pulses intact and symmetrical. Psychiatry: Judgement and insight appear impaired.   DVT prophylaxis: SCDs Code Status: DNR Family Communication:  Brother was updated on phone. Disposition Plan:  Status is: Inpatient  Remains inpatient appropriate because:Inpatient level of care appropriate due to severity of illness   Dispo: The patient is from: SNF              Anticipated d/c is to: SNF              Patient currently is not medically stable to d/c.   Difficult to place patient               Level of care: Stepdown  All the records are reviewed and case discussed with Care Management/Social Worker. Management  plans discussed with the patient, nursing and they are in agreement.  Consultants:   Palliative care  Procedures:  Antimicrobials:  Ceftriaxone  Data Reviewed: I have personally reviewed following labs and imaging studies  CBC: Recent Labs  Lab 10/12/20 0357 10/13/20 0503 10/14/20 0327 10/08/2020 1722 10/18/20 0546  WBC 7.3 5.9 5.4 11.5* 11.4*  NEUTROABS  --   --   --  9.8* 9.7*  HGB 8.0* 8.2* 8.4* 8.7* 9.5*  HCT 25.9* 26.5* 27.0* 27.4* 28.9*  MCV 99.6 97.8 98.5 96.5 96.3  PLT 297 306 297 345 532   Basic Metabolic Panel: Recent Labs  Lab 10/12/20 0357 10/12/20 2037 10/13/20 0503 10/14/20 0327 10/15/20 0520 10/16/20 0538 10/22/2020 1722 10/18/20 0546  NA 133*  --  133* 135  --   --  132* 133*  K 3.2*  --  3.9 3.9  --   --  4.3 3.9  CL 105  --  100 103  --   --  98 100  CO2 22  --  25 23  --   --  24 25  GLUCOSE 230*  --  85 94  --   --  107* 96  BUN 26*  --  23 23  --   --  28* 30*  CREATININE 1.20  --  1.23 1.29*  --   --  1.95* 1.98*  CALCIUM 7.5*  --  8.8* 8.7*  --   --  9.0 8.7*  MG 1.6*  --  2.4 2.2 2.0 2.1  --   --   PHOS 1.6* 3.6 3.2 3.6 3.6 3.4  --   --    GFR: Estimated Creatinine Clearance: 28.6 mL/min (A) (by C-G formula based on SCr of 1.98 mg/dL (H)). Liver Function Tests: Recent Labs  Lab 10/11/2020 1722 10/18/20 0546  AST 36 35  ALT 30 28  ALKPHOS 133* 133*  BILITOT 1.6* 1.9*  PROT 7.1 6.8  ALBUMIN 3.1* 3.0*   No results for input(s): LIPASE, AMYLASE in the last 168 hours. No results for input(s): AMMONIA in the last 168 hours. Coagulation Profile: Recent Labs  Lab 09/28/2020 1722 10/18/20 0546  INR 1.4* 1.4*   Cardiac Enzymes: No results for input(s): CKTOTAL, CKMB, CKMBINDEX, TROPONINI in the last 168 hours. BNP (last 3 results) No results for input(s): PROBNP in the last 8760 hours. HbA1C: No results for input(s): HGBA1C in the last 72 hours. CBG: Recent Labs  Lab 10/13/2020 2100 10/18/20 0301  GLUCAP 92 96   Lipid  Profile: No results for input(s): CHOL, HDL, LDLCALC, TRIG, CHOLHDL, LDLDIRECT in the last 72 hours. Thyroid Function Tests: No results for input(s): TSH, T4TOTAL, FREET4, T3FREE, THYROIDAB in the last 72 hours. Anemia Panel: No results for input(s): VITAMINB12, FOLATE, FERRITIN, TIBC, IRON, RETICCTPCT in the last 72 hours. Sepsis Labs: Recent Labs  Lab 09/26/2020 1723 10/01/2020 2107 10/18/20 0546  PROCALCITON  --   --  0.54  LATICACIDVEN 1.6 1.5  --     Recent Results (from the past 240 hour(s))  MRSA PCR Screening     Status: None   Collection Time: 10/08/20  4:53 PM   Specimen: Nasopharyngeal  Result Value Ref Range Status   MRSA by PCR NEGATIVE NEGATIVE Final    Comment:        The GeneXpert MRSA Assay (FDA approved for NASAL specimens only), is one component of a comprehensive MRSA colonization surveillance program. It is not intended to diagnose MRSA infection nor to guide or monitor treatment for MRSA infections. Performed at [facility], [place], [address] [place], [place] [address]   Resp Panel by RT-PCR (Flu A&B, Covid) Nasopharyngeal Swab     Status: None   Collection Time: 10/16/20 11:22 AM   Specimen: Nasopharyngeal Swab; Nasopharyngeal(NP) swabs in vial transport medium  Result Value Ref Range Status   SARS Coronavirus 2 by RT PCR NEGATIVE NEGATIVE Final    Comment: (NOTE) SARS-CoV-2 target nucleic acids are NOT DETECTED.  The SARS-CoV-2 RNA is generally detectable in upper respiratory specimens during the acute phase of infection. The lowest concentration of SARS-CoV-2 viral copies this assay can detect is 138 copies/mL. A negative result does not preclude SARS-Cov-2 infection and should not be used as the sole basis for treatment or other patient management decisions. A negative result may occur with  improper specimen collection/handling, submission of specimen other than nasopharyngeal swab, presence of viral mutation(s) within the areas  targeted by this assay, and inadequate number of viral copies(<138 copies/mL). A negative result must be combined with clinical observations, patient history, and epidemiological information. The expected result is Negative.  Fact Sheet for Patients:  EntrepreneurPulse.com.au  Fact Sheet for Healthcare Providers:  IncredibleEmployment.be  This test is no t yet approved or cleared by the [place] and  has been authorized for detection and/or diagnosis of SARS-CoV-2 by FDA under an Emergency Use Authorization (EUA). This EUA will remain  in effect (meaning this test can be used) for the duration of the COVID-19 declaration under Section 564(b)(1) of the Act, 21 U.S.C.section 360bbb-3(b)(1), unless the authorization is terminated  or revoked sooner.       Influenza A by PCR NEGATIVE NEGATIVE Final   Influenza B by PCR NEGATIVE NEGATIVE Final    Comment: (NOTE) The Xpert Xpress SARS-CoV-2/FLU/RSV plus assay is intended as an aid in the diagnosis of influenza from Nasopharyngeal swab specimens and should not be used as a sole basis for treatment. Nasal washings and aspirates are unacceptable for Xpert Xpress SARS-CoV-2/FLU/RSV testing.  Fact Sheet for Patients: EntrepreneurPulse.com.au  Fact Sheet for Healthcare Providers: IncredibleEmployment.be  This test is not yet approved or cleared by the [place] FDA and has been authorized for detection and/or diagnosis of SARS-CoV-2 by FDA under an Emergency Use Authorization (EUA). This EUA will remain in effect (meaning this test can be used) for the duration of the COVID-19 declaration under Section 564(b)(1) of the Act, 21 U.S.C. section 360bbb-3(b)(1), unless the authorization is terminated or revoked.  Performed at [facility], [address] [place], [place] [address]   Blood Culture (routine x 2)     Status: None (Preliminary  result)   Collection Time: 10/05/2020  5:23 PM   Specimen: BLOOD  Result Value Ref Range Status   Specimen Description BLOOD BLOOD LEFT FOREARM  Final   Special Requests   Final    BOTTLES DRAWN AEROBIC AND ANAEROBIC Blood Culture adequate volume   Culture   Final    NO GROWTH < 12 HOURS Performed at [facility], [address] [place], [place] [address]    Report Status PENDING  Incomplete  Blood Culture (routine x 2)     Status: None (Preliminary result)   Collection Time: 09/29/2020  5:23 PM   Specimen: BLOOD  Result Value Ref Range Status   Specimen Description BLOOD LEFT ANTECUBITAL  Final   Special Requests   Final    BOTTLES DRAWN AEROBIC AND ANAEROBIC Blood Culture adequate volume   Culture   Final    NO GROWTH < 12 HOURS Performed at [facility], [address] [place], [place] [address]    Report Status PENDING  Incomplete  Resp Panel by RT-PCR (Flu A&B, Covid) Nasopharyngeal Swab     Status: None   Collection Time: 09/27/2020  5:39 PM   Specimen: Nasopharyngeal Swab; Nasopharyngeal(NP) swabs in vial transport medium  Result Value Ref Range Status   SARS Coronavirus 2 by RT PCR NEGATIVE NEGATIVE Final    Comment: (NOTE) SARS-CoV-2 target nucleic acids are NOT DETECTED.  The SARS-CoV-2 RNA is generally detectable in upper respiratory specimens during the acute phase of infection. The lowest concentration of SARS-CoV-2 viral copies this assay can detect is 138 copies/mL. A negative result does not preclude SARS-Cov-2 infection and should not be used as the sole basis for treatment or other patient management decisions. A negative result may occur with  improper specimen collection/handling, submission of specimen other than nasopharyngeal swab, presence of viral mutation(s) within the areas targeted by this assay, and inadequate number of viral copies(<138 copies/mL). A negative result must be combined with clinical observations, patient history,  and epidemiological information. The expected result is Negative.  Fact Sheet for Patients:  EntrepreneurPulse.com.au  Fact Sheet for Healthcare Providers:  IncredibleEmployment.be  This  test is no t yet approved or cleared by the [place] and  has been authorized for detection and/or diagnosis of SARS-CoV-2 by FDA under an Emergency Use Authorization (EUA). This EUA will remain  in effect (meaning this test can be used) for the duration of the COVID-19 declaration under Section 564(b)(1) of the Act, 21 U.S.C.section 360bbb-3(b)(1), unless the authorization is terminated  or revoked sooner.       Influenza A by PCR NEGATIVE NEGATIVE Final   Influenza B by PCR NEGATIVE NEGATIVE Final    Comment: (NOTE) The Xpert Xpress SARS-CoV-2/FLU/RSV plus assay is intended as an aid in the diagnosis of influenza from Nasopharyngeal swab specimens and should not be used as a sole basis for treatment. Nasal washings and aspirates are unacceptable for Xpert Xpress SARS-CoV-2/FLU/RSV testing.  Fact Sheet for Patients: EntrepreneurPulse.com.au  Fact Sheet for Healthcare Providers: IncredibleEmployment.be  This test is not yet approved or cleared by the [place] FDA and has been authorized for detection and/or diagnosis of SARS-CoV-2 by FDA under an Emergency Use Authorization (EUA). This EUA will remain in effect (meaning this test can be used) for the duration of the COVID-19 declaration under Section 564(b)(1) of the Act, 21 U.S.C. section 360bbb-3(b)(1), unless the authorization is terminated or revoked.  Performed at [facility], [address] [place], [place] [address]   MRSA PCR Screening     Status: None   Collection Time: 10/18/20  8:41 AM   Specimen: Nasopharyngeal  Result Value Ref Range Status   MRSA by PCR NEGATIVE NEGATIVE Final    Comment:        The GeneXpert MRSA Assay  (FDA approved for NASAL specimens only), is one component of a comprehensive MRSA colonization surveillance program. It is not intended to diagnose MRSA infection nor to guide or monitor treatment for MRSA infections. Performed at [facility], [address] [place], [place] [address]      Radiology Studies: [facility] Chest [place] 1 View  Result Date: 10/06/2020 CLINICAL DATA:  Questionable sepsis. EXAM: PORTABLE CHEST 1 VIEW COMPARISON:  10/11/2020 FINDINGS: There is a left chest wall ICD with lead in the right ventricle. Previous median sternotomy and CABG procedure. Stable cardiac enlargement. Bilateral pleural effusions noted, left greater than right. Extensive bilateral interstitial and airspace densities have increased from previous exam. IMPRESSION: 1. Worsening aeration to both lungs. 2. Bilateral pleural effusions, left greater than right. Electronically Signed   By: Kerby Moors M.D.   On: 10/01/2020 18:39    Scheduled Meds: . amiodarone  200 mg Oral Daily  . chlorhexidine  15 mL Mouth Rinse BID  . Chlorhexidine Gluconate Cloth  6 each Topical Daily  . DULoxetine  30 mg Oral QHS  . ferrous sulfate  325 mg Oral Q breakfast  . mouth rinse  15 mL Mouth Rinse q12n4p  . midodrine  5 mg Oral TID WC  . pantoprazole  40 mg Oral Daily  . sucralfate  1 g Oral BID   Continuous Infusions: . ceFEPime (MAXIPIME) IV    . sodium chloride       LOS: 1 day   Time spent: 45 minutes. More than 50% of the time was spent in counseling/coordination of care  Lorella Nimrod, MD [facility]  If 7PM-7AM, please contact night-coverage [email or web address]  10/18/2020, 2:40 PM   This record has been created using Systems analyst. Errors have been sought and corrected,but may not always be located. Such creation errors do  not reflect on the standard of care.

## 2020-10-18 NOTE — Progress Notes (Signed)
Per MD Reesa Chew, if pt is not voiding, bladder scan pt. and insert a foley catheter. Night shift RN made aware.

## 2020-10-18 NOTE — TOC Initial Note (Signed)
Transition of Care [facility]) - Initial/Assessment Note    Patient Details  Name: Carl [name] MRN: [id number] Date of Birth: February 06, 1951  Transition of Care [facility]) CM/SW Contact:    Adelene Amas, Ava Phone Number: 10/18/2020, 2:49 PM  Clinical Narrative:                  Patient discharged from [facility] on 10/16/2020 and was admitted to [facility] for rehab.  Patient returned to [facility] ED on 10/22/2020 due to increasing SOB and confusion.  CSW spoke with patient's Carl, [name] [facility]) [phone], main care giver and decision maker. CSW went over the role of TOC inpatient care, and spoke at length about what Medicare covers, including rehab/co-pay days, home health and what Medicaid covers, and long term care.  Ms. Normington stated he felt the patient was discharged too soon from the hospital and that is why he had to return in 24 hours.  CSW stated he would need to speak with the patient's doctor about his concerns.  CSW stated I would be able to assist with discharge planning once the patient was closer to d/c. CSW gave Mr. Biernat Medicare.gov information and encouraged him to contact me if he had any more questions. Mr. Toledo verbalized understanding.  Expected Discharge Plan: Skilled Nursing Facility Barriers to Discharge: Insurance Authorization,Continued Medical Work up   Patient Goals and CMS Choice   CMS Medicare.gov Compare Post Acute Care list provided to:: Patient Represenative (must comment) Carl, [name] (Brother)   [phone]) Choice offered to / list presented to : Sibling Carl, [name] [facility])   [phone])  Expected Discharge Plan and Services Expected Discharge Plan: [place] In-house Referral: Clinical Social Work   Post Acute Care Choice: [place] Living arrangements for the past 2 months: [place]                                      Prior Living Arrangements/Services Living arrangements for the past 2 months: Single  Family Home Lives with:: Relatives Patient language and need for interpreter reviewed:: Yes Do you feel safe going back to the place where you live?: Yes      Need for Family Participation in Patient Care: Yes (Comment) Care giver support system in place?: Yes (comment)   Criminal Activity/Legal Involvement Pertinent to Current Situation/Hospitalization: No - Comment as needed  Activities of Daily Living Home Assistive Devices/Equipment: Walker (specify type),Hearing aid,Dentures (specify type),Eyeglasses ADL Screening (condition at time of admission) Patient's cognitive ability adequate to safely complete daily activities?: No Is the patient deaf or have difficulty hearing?: Yes Does the patient have difficulty seeing, even when wearing glasses/contacts?: No Does the patient have difficulty concentrating, remembering, or making decisions?: Yes Patient able to express need for assistance with ADLs?: No Does the patient have difficulty dressing or bathing?: Yes Independently performs ADLs?: No Does the patient have difficulty walking or climbing stairs?: Yes Weakness of Legs: Both Weakness of Arms/Hands: Both  Permission Sought/Granted Permission sought to share information with : Facility Sport and exercise psychologist Permission granted to share information with : Yes, Verbal Permission Granted  Share Information with NAME: Carl, [name] (Brother)   [phone]  Permission granted to share info w AGENCY: [facility]        Emotional Assessment Appearance:: Appears stated age Attitude/Demeanor/Rapport: Unable to Assess Affect (typically observed): Unable to Assess   Alcohol / Substance Use: Not Applicable Psych Involvement:  No (comment)  Admission diagnosis:  Sepsis associated hypotension (HCC) [A41.9, I95.9] Acute respiratory failure with hypoxia ([place]) [J96.01] Patient Active Problem List   Diagnosis Date Noted  . Sepsis associated hypotension ([place]) 10/16/2020  . Failure to thrive  in adult 09/29/2020  . HFrEF (heart failure with reduced ejection fraction) ([place])   . Paroxysmal atrial fibrillation (HCC)   . Shock circulatory ([place]) 10/08/2020  . Acute kidney injury superimposed on CKD ([place]) 08/03/2020  . Leukocytosis 08/03/2020  . Acute on chronic blood loss anemia 08/02/2020  . Pressure injury of skin 06/29/2020  . Hyponatremia 06/27/2020  . Acute GI bleeding 06/27/2020  . 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 Persistent atrial fibrillation ([place]) 11/30/2019  . Secondary hypercoagulable state ([place]) 11/30/2019  . Arm DVT (deep venous thromboembolism), acute, left ([place]) 11/26/2019  . Hx of adenomatous colonic polyps 05/18/2019  . Cirrhosis ([place]) 05/12/2019  . Occult blood positive stool 05/12/2019  . HCAP (healthcare-associated pneumonia) 03/04/2019  . Hallucinations 02/10/2019  . Elevated blood sugar 01/27/2019  . S/P mitral valve repair 12/31/2018  . Bilateral pulmonary contusion 10/17/2018  . Cardiac LV ejection fraction of 20-34% 10/17/2018  . Chronic systolic CHF (congestive heart failure) ([place]) 10/17/2018  . History of ischemic cardiomyopathy 10/17/2018  . ICD (implantable cardioverter-defibrillator) in place 10/17/2018  . S/p left hip fracture 10/17/2018  . Traumatic retroperitoneal hematoma 10/17/2018  . Age-related osteoporosis with current pathological fracture with routine healing 07/28/2018  . CAD (coronary artery disease) 07/21/2018  . GERD (gastroesophageal reflux disease) 07/21/2018  . Depression 07/21/2018  . CKD (chronic kidney disease), stage III ([place]) 07/21/2018  . Iron deficiency anemia 07/21/2018  . Hypokalemia 07/21/2018  . Closed left hip fracture ([place]) 07/21/2018  . Fracture of femoral neck, left ([place]) 07/21/2018  . Lower extremity pain, bilateral 11/12/2017  . 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 Hyperlipidemia 09/01/2017  . Essential hypertension 09/01/2017  . PAD (peripheral artery disease) ([place])  09/01/2017  . Symptomatic anemia 08/06/2017  . GAD (generalized anxiety disorder) 06/18/2017  . Prostate cancer screening 04/28/2017  . Hydrocele 04/28/2017  . Bradycardia   . Premature ventricular contraction   . Severe mitral insufficiency   . Cardiomyopathy, ischemic 04/26/2015  . Difficulty hearing 12/29/2014  . Acute on chronic systolic CHF (congestive heart failure) ([place]) 10/10/2014  . 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 Protein-calorie malnutrition, severe ([place]) 07/29/2014  . AKI (acute kidney injury) ([place]) 07/29/2014   PCP:  [name], MD Pharmacy:   [id number] - [place], [place] - [address] [address] Phone: [phone] Fax: [phone]     Social Determinants of Health (SDOH) Interventions    Readmission Risk Interventions Readmission Risk Prevention Plan 03/05/2019 12/31/2018  Transportation Screening Complete Complete  PCP or Specialist Appt within 3-5 Days Complete Complete  HRI or [place] Complete Complete  Social Work Consult for [employer] Planning/Counseling Complete Complete  Palliative Care Screening Complete Not Applicable  Medication Review Press photographer) Complete Complete  Some recent data might be hidden

## 2020-10-19 ENCOUNTER — Inpatient Hospital Stay: Payer: Medicare Other

## 2020-10-19 ENCOUNTER — Inpatient Hospital Stay (HOSPITAL_COMMUNITY)
Admit: 2020-10-19 | Discharge: 2020-10-19 | Disposition: A | Discharge status: Home / Self Care | Payer: Medicare Other | Attending: Cardiovascular Disease | Admitting: Cardiovascular Disease

## 2020-10-19 DIAGNOSIS — K7460 Unspecified cirrhosis of liver: Secondary | ICD-10-CM | POA: Diagnosis not present

## 2020-10-19 DIAGNOSIS — R627 Adult failure to thrive: Secondary | ICD-10-CM | POA: Diagnosis not present

## 2020-10-19 DIAGNOSIS — J90 Pleural effusion, not elsewhere classified: Secondary | ICD-10-CM

## 2020-10-19 DIAGNOSIS — J9601 Acute respiratory failure with hypoxia: Secondary | ICD-10-CM | POA: Diagnosis not present

## 2020-10-19 DIAGNOSIS — I5022 Chronic systolic (congestive) heart failure: Secondary | ICD-10-CM | POA: Diagnosis not present

## 2020-10-19 DIAGNOSIS — I959 Hypotension, unspecified: Secondary | ICD-10-CM | POA: Diagnosis not present

## 2020-10-19 DIAGNOSIS — I25118 Atherosclerotic heart disease of native coronary artery with other forms of angina pectoris: Secondary | ICD-10-CM | POA: Diagnosis not present

## 2020-10-19 DIAGNOSIS — I5023 Acute on chronic systolic (congestive) heart failure: Secondary | ICD-10-CM | POA: Diagnosis not present

## 2020-10-19 DIAGNOSIS — I10 Essential (primary) hypertension: Secondary | ICD-10-CM | POA: Diagnosis not present

## 2020-10-19 DIAGNOSIS — Z7189 Other specified counseling: Secondary | ICD-10-CM | POA: Diagnosis not present

## 2020-10-19 DIAGNOSIS — J810 Acute pulmonary edema: Secondary | ICD-10-CM

## 2020-10-19 DIAGNOSIS — A419 Sepsis, unspecified organism: Secondary | ICD-10-CM | POA: Diagnosis not present

## 2020-10-19 LAB — ECHOCARDIOGRAM COMPLETE
AR max vel: 1.47 cm2
AV Area VTI: 1.59 cm2
AV Area mean vel: 1.24 cm2
AV Mean grad: 2.7 mmHg
AV Peak grad: 5.1 mmHg
Ao pk vel: 1.13 m/s
Area-P 1/2: 4.26 cm2
Calc EF: 37.3 %
Height: 70 in
MV VTI: 0.69 cm2
S' Lateral: 4.86 cm
Single Plane A2C EF: 35.1 %
Single Plane A4C EF: 40.4 %
Weight: 2028.23 oz

## 2020-10-19 LAB — URINE CULTURE: Culture: NO GROWTH

## 2020-10-19 LAB — BASIC METABOLIC PANEL
Anion gap: 10 (ref 5–15)
BUN: 32 mg/dL — ABNORMAL HIGH (ref 8–23)
CO2: 24 mmol/L (ref 22–32)
Calcium: 8.8 mg/dL — ABNORMAL LOW (ref 8.9–10.3)
Chloride: 102 mmol/L (ref 98–111)
Creatinine, Ser: 1.67 mg/dL — ABNORMAL HIGH (ref 0.61–1.24)
GFR, Estimated: 44 mL/min — ABNORMAL LOW (ref >60)
Glucose, Bld: 93 mg/dL (ref 70–99)
Potassium: 4 mmol/L (ref 3.5–5.1)
Sodium: 136 mmol/L (ref 135–145)

## 2020-10-19 LAB — CBC
HCT: 29.8 % — ABNORMAL LOW (ref 39.0–52.0)
Hemoglobin: 9.1 g/dL — ABNORMAL LOW (ref 13.0–17.0)
MCH: 29.9 pg (ref 26.0–34.0)
MCHC: 30.5 g/dL (ref 30.0–36.0)
MCV: 98 fL (ref 80.0–100.0)
Platelets: 368 10*3/uL (ref 150–400)
RBC: 3.04 MIL/uL — ABNORMAL LOW (ref 4.22–5.81)
RDW: 17.2 % — ABNORMAL HIGH (ref 11.5–15.5)
WBC: 12.2 10*3/uL — ABNORMAL HIGH (ref 4.0–10.5)
nRBC: 0 % (ref 0.0–0.2)

## 2020-10-19 LAB — PROTEIN, PLEURAL OR PERITONEAL FLUID: Total protein, fluid: <3 g/dL

## 2020-10-19 LAB — GLUCOSE, PLEURAL OR PERITONEAL FLUID: Glucose, Fluid: 94 mg/dL

## 2020-10-19 LAB — BODY FLUID CELL COUNT WITH DIFFERENTIAL
Eos, Fluid: 0 %
Lymphs, Fluid: 23 %
Monocyte-Macrophage-Serous Fluid: 25 %
Neutrophil Count, Fluid: 52 %
Total Nucleated Cell Count, Fluid: 48 cu mm

## 2020-10-19 LAB — LACTATE DEHYDROGENASE, PLEURAL OR PERITONEAL FLUID: LD, Fluid: 78 U/L — ABNORMAL HIGH (ref 3–23)

## 2020-10-19 MED ORDER — HALOPERIDOL LACTATE 5 MG/ML IJ SOLN
2.5000 mg | Freq: Once | INTRAMUSCULAR | Status: AC
  Administered 2020-10-19: 2.5 mg via INTRAVENOUS
  Filled 2020-10-19: qty 1

## 2020-10-19 MED ORDER — MORPHINE SULFATE (PF) 2 MG/ML IV SOLN
INTRAVENOUS | Status: AC
  Filled 2020-10-19: qty 1

## 2020-10-19 MED ORDER — LORAZEPAM 2 MG/ML IJ SOLN
1.0000 mg | Freq: Once | INTRAMUSCULAR | Status: AC
  Administered 2020-10-19: 1 mg via INTRAVENOUS
  Filled 2020-10-19: qty 1

## 2020-10-19 MED ORDER — MORPHINE SULFATE (PF) 2 MG/ML IV SOLN
2.0000 mg | Freq: Once | INTRAVENOUS | Status: AC
  Administered 2020-10-20: 2 mg via INTRAVENOUS

## 2020-10-19 NOTE — Procedures (Signed)
PROCEDURE SUMMARY:  Successful US guided left thoracentesis. Yielded 1 L of clear yellow fluid. Pt tolerated procedure well. No immediate complications.  Specimen was sent for labs. CXR ordered.  EBL < 5 mL  Ascencion Dike PA-C 10/19/2020 3:40 PM

## 2020-10-19 NOTE — Plan of Care (Signed)
  Problem: Education: Goal: Knowledge of General Education information will improve Description: Including pain rating scale, medication(s)/side effects and non-pharmacologic comfort measures 10/19/2020 1123 by Cristela Blue, RN Outcome: Progressing 10/19/2020 1123 by Cristela Blue, RN Outcome: Progressing   Problem: Health Behavior/Discharge Planning: Goal: Ability to manage health-related needs will improve 10/19/2020 1123 by Cristela Blue, RN Outcome: Progressing 10/19/2020 1123 by Cristela Blue, RN Outcome: Progressing   Problem: Clinical Measurements: Goal: Ability to maintain clinical measurements within normal limits will improve 10/19/2020 1123 by Cristela Blue, RN Outcome: Progressing 10/19/2020 1123 by Cristela Blue, RN Outcome: Progressing Goal: Will remain free from infection 10/19/2020 1123 by Cristela Blue, RN Outcome: Progressing 10/19/2020 1123 by Cristela Blue, RN Outcome: Progressing Goal: Diagnostic test results will improve 10/19/2020 1123 by Cristela Blue, RN Outcome: Progressing 10/19/2020 1123 by Cristela Blue, RN Outcome: Progressing Goal: Respiratory complications will improve 10/19/2020 1123 by Cristela Blue, RN Outcome: Progressing 10/19/2020 1123 by Cristela Blue, RN Outcome: Progressing Goal: Cardiovascular complication will be avoided 10/19/2020 1123 by Cristela Blue, RN Outcome: Progressing 10/19/2020 1123 by Cristela Blue, RN Outcome: Progressing   Problem: Activity: Goal: Risk for activity intolerance will decrease 10/19/2020 1123 by Cristela Blue, RN Outcome: Progressing 10/19/2020 1123 by Cristela Blue, RN Outcome: Progressing   Problem: Nutrition: Goal: Adequate nutrition will be maintained 10/19/2020 1123 by Cristela Blue, RN Outcome: Progressing 10/19/2020 1123 by Cristela Blue, RN Outcome: Progressing   Problem: Coping: Goal: Level of anxiety will decrease 10/19/2020 1123 by Cristela Blue, RN Outcome:  Progressing 10/19/2020 1123 by Cristela Blue, RN Outcome: Progressing   Problem: Elimination: Goal: Will not experience complications related to bowel motility 10/19/2020 1123 by Cristela Blue, RN Outcome: Progressing 10/19/2020 1123 by Cristela Blue, RN Outcome: Progressing Goal: Will not experience complications related to urinary retention 10/19/2020 1123 by Cristela Blue, RN Outcome: Progressing 10/19/2020 1123 by Cristela Blue, RN Outcome: Progressing   Problem: Pain Managment: Goal: General experience of comfort will improve 10/19/2020 1123 by Cristela Blue, RN Outcome: Progressing 10/19/2020 1123 by Cristela Blue, RN Outcome: Progressing   Problem: Safety: Goal: Ability to remain free from injury will improve 10/19/2020 1123 by Cristela Blue, RN Outcome: Progressing 10/19/2020 1123 by Cristela Blue, RN Outcome: Progressing   Problem: Skin Integrity: Goal: Risk for impaired skin integrity will decrease 10/19/2020 1123 by Cristela Blue, RN Outcome: Progressing 10/19/2020 1123 by Cristela Blue, RN Outcome: Progressing

## 2020-10-19 NOTE — Progress Notes (Signed)
*  PRELIMINARY RESULTS* Echocardiogram 2D Echocardiogram has been performed.  Carl [name], Carl [name] 10/19/2020, 11:04 AM

## 2020-10-19 NOTE — Progress Notes (Signed)
Daily Progress Note   Patient Name: Carl [name]       Date: 10/19/2020 DOB: 07-06-1951  Age: 70 y.o. MRN#: [id number] Attending Physician: Lorella Nimrod, MD Primary Care Physician: [name], MD Admit Date: 09/25/2020  Reason for Consultation/Follow-up: Establishing goals of care  Subjective: Remains on bipap, communication difficult, nods when I ask him if I can call his daughter  Length of Stay: 2  Current Medications: Scheduled Meds:  . amiodarone  200 mg Oral Daily  . chlorhexidine  15 mL Mouth Rinse BID  . Chlorhexidine Gluconate Cloth  6 each Topical Daily  . DULoxetine  30 mg Oral QHS  . ferrous sulfate  325 mg Oral Q breakfast  . furosemide  40 mg Intravenous Daily  . mouth rinse  15 mL Mouth Rinse q12n4p  . midodrine  5 mg Oral TID WC  . pantoprazole  40 mg Oral Daily  . sucralfate  1 g Oral BID    Continuous Infusions: . cefTRIAXone (ROCEPHIN)  IV 1 g (10/18/20 1830)  . sodium chloride      PRN Meds: acetaminophen **OR** acetaminophen, ondansetron **OR** ondansetron (ZOFRAN) IV  Physical Exam Cardiovascular:     Rate and Rhythm: Normal rate.  Pulmonary:     Comments: Requiring bipap, some desaturation during my interaction with him Skin:    General: Skin is warm and dry.  Neurological:     Mental Status: He is alert.     Comments: Difficult to assess, seems confused and slightly agitated             Vital Signs: BP (!) 89/77   Pulse 79   Temp 98.2 F (36.8 C) (Oral)   Resp (!) 38   Ht '5\' 10"'  (1.778 m)   Wt 57.5 kg   SpO2 98%   BMI 18.19 kg/m  SpO2: SpO2: 98 % O2 Device: O2 Device: Nasal Cannula O2 Flow Rate: O2 Flow Rate (L/min): 4 L/min  Intake/output summary:   Intake/Output Summary (Last 24 hours) at 10/19/2020 1223 Last data filed at  10/19/2020 1116 Gross per 24 hour  Intake --  Output 1426 ml  Net -1426 ml   LBM: Last BM Date: 10/18/20 Baseline Weight: Weight: 77.9 kg Most recent weight: Weight: 57.5 kg       Palliative Assessment/Data: PPS 20% d/t PO intake    Flowsheet Rows   Flowsheet Row Most Recent Value  Intake Tab   Referral Department Hospitalist  Unit at Time of Referral ICU  Palliative Care Primary Diagnosis Sepsis/Infectious Disease  Date Notified 10/18/20  Palliative Care Type Return patient Palliative Care  Reason for referral Clarify Goals of Care  Date of Admission 10/05/2020  Date first seen by Palliative Care 10/18/20  # of days Palliative referral response time 0 Day(s)  # of days IP prior to Palliative referral 1  Clinical Assessment   Palliative Performance Scale Score 30%  Psychosocial & Spiritual Assessment   Palliative Care Outcomes   Patient/Family meeting held? Yes  Who was at the meeting? daughter  [place] goals of care      Patient Active Problem List   Diagnosis Date Noted  . Sepsis associated hypotension ([place])  10/16/2020  . Failure to thrive in adult 10/12/2020  . HFrEF (heart failure with reduced ejection fraction) ([place])   . Paroxysmal atrial fibrillation (HCC)   . Shock circulatory ([place]) 10/08/2020  . Acute kidney injury superimposed on CKD ([place]) 08/03/2020  . Leukocytosis 08/03/2020  . Acute on chronic blood loss anemia 08/02/2020  . Pressure injury of skin 06/29/2020  . Hyponatremia 06/27/2020  . Acute GI bleeding 06/27/2020  . 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 Persistent atrial fibrillation ([place]) 11/30/2019  . Secondary hypercoagulable state ([place]) 11/30/2019  . Arm DVT (deep venous thromboembolism), acute, left ([place]) 11/26/2019  . Hx of adenomatous colonic polyps 05/18/2019  . Cirrhosis ([place]) 05/12/2019  . Occult blood positive stool 05/12/2019  . HCAP (healthcare-associated pneumonia) 03/04/2019  . Hallucinations  02/10/2019  . Elevated blood sugar 01/27/2019  . S/P mitral valve repair 12/31/2018  . Bilateral pulmonary contusion 10/17/2018  . Cardiac LV ejection fraction of 20-34% 10/17/2018  . Chronic systolic CHF (congestive heart failure) ([place]) 10/17/2018  . History of ischemic cardiomyopathy 10/17/2018  . ICD (implantable cardioverter-defibrillator) in place 10/17/2018  . S/p left hip fracture 10/17/2018  . Traumatic retroperitoneal hematoma 10/17/2018  . Age-related osteoporosis with current pathological fracture with routine healing 07/28/2018  . CAD (coronary artery disease) 07/21/2018  . GERD (gastroesophageal reflux disease) 07/21/2018  . Depression 07/21/2018  . CKD (chronic kidney disease), stage III ([place]) 07/21/2018  . Iron deficiency anemia 07/21/2018  . Hypokalemia 07/21/2018  . Closed left hip fracture ([place]) 07/21/2018  . Fracture of femoral neck, left ([place]) 07/21/2018  . Lower extremity pain, bilateral 11/12/2017  . 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 Hyperlipidemia 09/01/2017  . Essential hypertension 09/01/2017  . PAD (peripheral artery disease) ([place]) 09/01/2017  . Symptomatic anemia 08/06/2017  . GAD (generalized anxiety disorder) 06/18/2017  . Prostate cancer screening 04/28/2017  . Hydrocele 04/28/2017  . Bradycardia   . Premature ventricular contraction   . Severe mitral insufficiency   . Cardiomyopathy, ischemic 04/26/2015  . Difficulty hearing 12/29/2014  . Acute on chronic systolic CHF (congestive heart failure) ([place]) 10/10/2014  . 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 Protein-calorie malnutrition, severe ([place]) 07/29/2014  . AKI (acute kidney injury) ([place]) 07/29/2014    Palliative Care Assessment & Plan   HPI: 71 y.o. male  with past medical history of GI bleed, acute kidney injury, recent admission for septic shock with chronic GI bleed, heart failure with reduced ejection fraction, paroxysmal atrial fibrillation, hypertension, depression, liver  cirrhosis, coronary artery disease, GERD, hearing loss, tubular adenoma of the colon, and peripheral vascular disease admitted on 09/30/2020 with hypoxia and AMS. Diagnosed with severe sepsis and AKI. PMT consulted to discuss [place].   Assessment: Follow up with Mr. Paternoster - no family at bedside. Update received from RN - continues to require bipap off/on. Mental status better than yesterday.   Patient is known to me from previous admission - I met with family 10/10/20 - at that time goals were for DNR and NO dialysis, family considering home with hospice but ultimately decided to go to SNF.   Called patient's daughter Barnett Applebaum to discuss clinical course - provided update on continued need for bipap, unable to take in nutrition d/t risk for aspiration. We discuss his weak heart and how this affects his breathing. Barnett Applebaum expresses understanding and asks appropriate questions.   For now, plan is to continue current medical care with limit of DNR. Allow time for outcomes. PMT will follow.  Recommendations/Plan: Continue current measures - bipap, medications - limit of DNR Family is hopeful for return to rehab - open to further discussion Will follow, hopeful to complete MOST prior to dc if patient goes to SNF Patient has verbally stated he would want his brother to make medical decisions for him but this has not been documented - family has shared with me that they all make decisions together  Code Status: DNR  Discharge Planning: To Be Determined  Care plan was discussed with RN and patient's daughter, Barnett Applebaum  Thank you for allowing the Palliative Medicine Team to assist in the care of this patient.   Total Time 20 minutes Prolonged Time Billed  no       Greater than 50%  of this time was spent counseling and coordinating care related to the above assessment and plan.  Juel Burrow, DNP, [facility] Palliative Medicine Team Team Phone # [phone]  Pager [phone]

## 2020-10-19 NOTE — Progress Notes (Signed)
PROGRESS NOTE    Carl [name]  WYO:[id number] DOB: 1950/12/10 DOA: 10/09/2020 PCP: [name], MD   Brief Narrative: Taken from H&P. Carl [name] is a 70 y.o. male with medical history significant for previous GI bleed, acute kidney injury, recent admission for shock with chronic GI bleed, heart failure reduced ejection fraction, paroxysmal atrial fibrillation, history of hypertension, depression, liver cirrhosis, coronary artery disease, GERD, hearing loss, Schatzki's ring, tubular adenoma of the colon, peripheral vascular disease, osteoporosis, presents to the emergency department from [facility] for chief concerns of hypoxia on room air. On arrival patient was minimally responsive to sternal rub.  Appears very lethargic.  He was febrile at 101.9, procalcitonin at 0.54, elevated BNP at 2556, hypotensive, chest x-ray with bilateral pleural effusions and no infiltrate, blood and urine cultures negative. Admitted with concern of sepsis and failure to thrive. Sepsis ruled out at this point as there is no source of infection.  Subjective: Patient was little more alert today, getting echocardiogram, remained on BiPAP with increased work of breathing and tachypnea. Denies any new complaints by nodding his head.  Assessment & Plan:   Principal Problem:   Sepsis associated hypotension (HCC) Active Problems:   Protein-calorie malnutrition, severe (HCC)   AKI (acute kidney injury) ([place])   S/P CABG x 5   Hyperlipidemia   Essential hypertension   CAD (coronary artery disease)   GERD (gastroesophageal reflux disease)   Depression   Symptomatic anemia   Chronic systolic CHF (congestive heart failure) (HCC)   Cirrhosis (HCC)   Hyponatremia   Acute kidney injury superimposed on CKD (HCC)   Leukocytosis   HFrEF (heart failure with reduced ejection fraction) (HCC)   Paroxysmal atrial fibrillation (HCC)   Failure to thrive in adult  Sepsis/SIRS.  Patient met sepsis criteria initially with  fever, leukocytosis, tachypnea, AKI and encephalopathy.  Procalcitonin at 0.54.  Chest x-ray is not very impressive for pneumonia.  Blood and urine cultures negative at this time.    He was started on broad-spectrum antibiotics with cefepime and vancomycin.  Did not received much fluid with HFrEF and concern of pulmonary edema.   He was also started on midodrine to keep the MAP greater than 65. No obvious source of infection at this time.  MRSA swab negative, COVID-19 negative. Sepsis ruled out at this point. Vancomycin was discontinued earlier and cefepime was switched to ceftriaxone. -Getting thoracentesis. -Continue with ceftriaxone empirically at this time as patient remained very sick. -Continue with midodrine keep the map above 60 -Obtain swallow evaluation to rule out any aspiration  Acute hypoxic respiratory failure.  Currently requiring BiPAP.  Most likely secondary to acute on chronic HFrEF. -Repeat chest x-ray with worsening aeration and bilateral pleural effusions, left more than right. -We will try thoracentesis to see if that will help with his breathing. -Continue with BiPAP-wean as tolerated.  HFrEF.  Patient has an history of severe ischemic cardiomyopathy and coronary artery disease s/p CABG and severe mitral valve regurgitation s/p mitral valve clip.  EF of 20 to 25%.  Not a candidate for any further aggressive measures.  His home dose of carvedilol, metolazone and torsemide were discontinued during prior hospitalization due to softer blood pressure. Patient appears in pulmonary edema.  BNP at 2559 Cardiology was consulted-shared their help. Repeat echocardiogram done-pending results He tolerated Lasix yesterday and blood pressure within reasonable range with midodrine so we will continue IV Lasix.  Failure to thrive.  Patient was recently discharged to SNF and at that  time his heart failure medications were discontinued due to softer blood pressure.  Outpatient palliative care  follow-up was recommended. -Palliative care consult-patient is appropriate for comfort measures/hospice care. -Patient is very high risk for deterioration and death.  AKI.  Most likely secondary to cardiorenal. -Monitor renal function with gentle diuresis if he can tolerate. -Avoid nephrotoxins.  Liver cirrhosis.  Meld sodium score of 23. Not a candidate for transplant. -Continue with supportive care  Paroxysmal atrial fibrillation.  Currently in sinus rhythm. -Continue home dose of amiodarone -Not a candidate for anticoagulation due to recent GI bleed.  Chronic anemia with recent GI bleed.  Anticoagulation was discontinued.  Hemoglobin stable at 9.1. -Continue to monitor  History of depression. -Continue home dose of Cymbalta  History of gout.  No acute concern. -Continue home dose of allopurinol.  GERD. -Continue Protonix  Objective: Vitals:   10/19/20 0800 10/19/20 0900 10/19/20 1000 10/19/20 1100  BP: 91/73 109/78 122/84 (!) 89/77  Pulse:  (!) 108 81 79  Resp: (!) 26 (!) 31 (!) 34 (!) 38  Temp: 98.2 F (36.8 C)     TempSrc: Oral     SpO2: 100% 90% 95% 98%  Weight:      Height:        Intake/Output Summary (Last 24 hours) at 10/19/2020 1330 Last data filed at 10/19/2020 1116 Gross per 24 hour  Intake --  Output 625 ml  Net -625 ml   Filed Weights   10/16/2020 1720 09/26/2020 2115  Weight: 77.9 kg 57.5 kg    Examination:  General. Chronically ill-appearing, frail elderly man, appears much older than his stated age, in no acute distress. Pulmonary. Decreased breath sound at bases, tachypnea and increased work of breathing, on BiPAP. CV.  Regular rate and rhythm, no JVD, rub or murmur. Abdomen.  Soft, nontender, nondistended, BS positive. CNS.  Alert and following some simple commands Extremities.  No edema, no cyanosis, pulses intact and symmetrical. Psychiatry.  Judgment and insight appears impaired.  DVT prophylaxis: SCDs Code Status: DNR Family  Communication:  Daughter was updated on phone. Disposition Plan:  Status is: Inpatient  Remains inpatient appropriate because:Inpatient level of care appropriate due to severity of illness   Dispo: The patient is from: SNF              Anticipated d/c is to: SNF              Patient currently is not medically stable to d/c.   Difficult to place patient               Level of care: Stepdown  All the records are reviewed and case discussed with Care Management/Social Worker. Management plans discussed with the patient, nursing and they are in agreement.  Consultants:   Palliative care  Procedures:  Antimicrobials:  Ceftriaxone  Data Reviewed: I have personally reviewed following labs and imaging studies  CBC: Recent Labs  Lab 10/13/20 0503 10/14/20 0327 10/15/2020 1722 10/18/20 0546 10/19/20 0418  WBC 5.9 5.4 11.5* 11.4* 12.2*  NEUTROABS  --   --  9.8* 9.7*  --   HGB 8.2* 8.4* 8.7* 9.5* 9.1*  HCT 26.5* 27.0* 27.4* 28.9* 29.8*  MCV 97.8 98.5 96.5 96.3 98.0  PLT 306 297 345 348 277   Basic Metabolic Panel: Recent Labs  Lab 10/12/20 2037 10/13/20 0503 10/14/20 0327 10/15/20 0520 10/16/20 0538 10/19/2020 1722 10/18/20 0546 10/19/20 0418  NA  --  133* 135  --   --  132* 133* 136  K  --  3.9 3.9  --   --  4.3 3.9 4.0  CL  --  100 103  --   --  98 100 102  CO2  --  25 23  --   --  _0 GLUCOSE  --  85 94  --   --  107* 96 93  BUN  --  23 23  --   --  28* 30* 32*  CREATININE  --  1.23 1.29*  --   --  1.95* 1.98* 1.67*  CALCIUM  --  8.8* 8.7*  --   --  9.0 8.7* 8.8*  MG  --  2.4 2.2 2.0 2.1  --   --   --   PHOS 3.6 3.2 3.6 3.6 3.4  --   --   --    GFR: Estimated Creatinine Clearance: 34 mL/min (A) (by C-G formula based on SCr of 1.67 mg/dL (H)). Liver Function Tests: Recent Labs  Lab 10/05/2020 1722 10/18/20 0546  AST 36 35  ALT 30 28  ALKPHOS 133* 133*  BILITOT 1.6* 1.9*  PROT 7.1 6.8  ALBUMIN 3.1* 3.0*   No results for input(s): LIPASE, AMYLASE in  the last 168 hours. No results for input(s): AMMONIA in the last 168 hours. Coagulation Profile: Recent Labs  Lab 10/07/2020 1722 10/18/20 0546  INR 1.4* 1.4*   Cardiac Enzymes: No results for input(s): CKTOTAL, CKMB, CKMBINDEX, TROPONINI in the last 168 hours. BNP (last 3 results) No results for input(s): PROBNP in the last 8760 hours. HbA1C: No results for input(s): HGBA1C in the last 72 hours. CBG: Recent Labs  Lab 10/07/2020 2100 10/18/20 0301  GLUCAP 92 96   Lipid Profile: No results for input(s): CHOL, HDL, LDLCALC, TRIG, CHOLHDL, LDLDIRECT in the last 72 hours. Thyroid Function Tests: No results for input(s): TSH, T4TOTAL, FREET4, T3FREE, THYROIDAB in the last 72 hours. Anemia Panel: No results for input(s): VITAMINB12, FOLATE, FERRITIN, TIBC, IRON, RETICCTPCT in the last 72 hours. Sepsis Labs: Recent Labs  Lab 09/30/2020 1723 10/13/2020 2107 10/18/20 0546  PROCALCITON  --   --  0.54  LATICACIDVEN 1.6 1.5  --     Recent Results (from the past 240 hour(s))  Resp Panel by RT-PCR (Flu A&B, Covid) Nasopharyngeal Swab     Status: None   Collection Time: 10/16/20 11:22 AM   Specimen: Nasopharyngeal Swab; Nasopharyngeal(NP) swabs in vial transport medium  Result Value Ref Range Status   SARS Coronavirus 2 by RT PCR NEGATIVE NEGATIVE Final    Comment: (NOTE) SARS-CoV-2 target nucleic acids are NOT DETECTED.  The SARS-CoV-2 RNA is generally detectable in upper respiratory specimens during the acute phase of infection. The lowest concentration of SARS-CoV-2 viral copies this assay can detect is 138 copies/mL. A negative result does not preclude SARS-Cov-2 infection and should not be used as the sole basis for treatment or other patient management decisions. A negative result may occur with  improper specimen collection/handling, submission of specimen other than nasopharyngeal swab, presence of viral mutation(s) within the areas targeted by this assay, and inadequate  number of viral copies(<138 copies/mL). A negative result must be combined with clinical observations, patient history, and epidemiological information. The expected result is Negative.  Fact Sheet for Patients:  EntrepreneurPulse.com.au  Fact Sheet for Healthcare Providers:  IncredibleEmployment.be  This test is no t yet approved or cleared by the [place] FDA and  has been authorized for detection and/or diagnosis of  SARS-CoV-2 by FDA under an Emergency Use Authorization (EUA). This EUA will remain  in effect (meaning this test can be used) for the duration of the COVID-19 declaration under Section 564(b)(1) of the Act, 21 U.S.C.section 360bbb-3(b)(1), unless the authorization is terminated  or revoked sooner.       Influenza A by PCR NEGATIVE NEGATIVE Final   Influenza B by PCR NEGATIVE NEGATIVE Final    Comment: (NOTE) The Xpert Xpress SARS-CoV-2/FLU/RSV plus assay is intended as an aid in the diagnosis of influenza from Nasopharyngeal swab specimens and should not be used as a sole basis for treatment. Nasal washings and aspirates are unacceptable for Xpert Xpress SARS-CoV-2/FLU/RSV testing.  Fact Sheet for Patients: EntrepreneurPulse.com.au  Fact Sheet for Healthcare Providers: IncredibleEmployment.be  This test is not yet approved or cleared by the [place] FDA and has been authorized for detection and/or diagnosis of SARS-CoV-2 by FDA under an Emergency Use Authorization (EUA). This EUA will remain in effect (meaning this test can be used) for the duration of the COVID-19 declaration under Section 564(b)(1) of the Act, 21 U.S.C. section 360bbb-3(b)(1), unless the authorization is terminated or revoked.  Performed at [facility], [address] [place], [place] [address]   Blood Culture (routine x 2)     Status: None (Preliminary result)   Collection Time: 10/14/2020  5:23  PM   Specimen: BLOOD  Result Value Ref Range Status   Specimen Description BLOOD BLOOD LEFT FOREARM  Final   Special Requests   Final    BOTTLES DRAWN AEROBIC AND ANAEROBIC Blood Culture adequate volume   Culture   Final    NO GROWTH 2 DAYS Performed at [facility], [address], [place], [place] [address]    Report Status PENDING  Incomplete  Blood Culture (routine x 2)     Status: None (Preliminary result)   Collection Time: 10/08/2020  5:23 PM   Specimen: BLOOD  Result Value Ref Range Status   Specimen Description BLOOD LEFT ANTECUBITAL  Final   Special Requests   Final    BOTTLES DRAWN AEROBIC AND ANAEROBIC Blood Culture adequate volume   Culture   Final    NO GROWTH 2 DAYS Performed at [facility], [address] [place], [place] [address]    Report Status PENDING  Incomplete  Resp Panel by RT-PCR (Flu A&B, Covid) Nasopharyngeal Swab     Status: None   Collection Time: 10/02/2020  5:39 PM   Specimen: Nasopharyngeal Swab; Nasopharyngeal(NP) swabs in vial transport medium  Result Value Ref Range Status   SARS Coronavirus 2 by RT PCR NEGATIVE NEGATIVE Final    Comment: (NOTE) SARS-CoV-2 target nucleic acids are NOT DETECTED.  The SARS-CoV-2 RNA is generally detectable in upper respiratory specimens during the acute phase of infection. The lowest concentration of SARS-CoV-2 viral copies this assay can detect is 138 copies/mL. A negative result does not preclude SARS-Cov-2 infection and should not be used as the sole basis for treatment or other patient management decisions. A negative result may occur with  improper specimen collection/handling, submission of specimen other than nasopharyngeal swab, presence of viral mutation(s) within the areas targeted by this assay, and inadequate number of viral copies(<138 copies/mL). A negative result must be combined with clinical observations, patient history, and epidemiological information. The expected result  is Negative.  Fact Sheet for Patients:  EntrepreneurPulse.com.au  Fact Sheet for Healthcare Providers:  IncredibleEmployment.be  This test is no t yet approved or cleared by the  [place] Architectural technologist and  has been authorized for detection and/or diagnosis of SARS-CoV-2 by FDA under an Print production planner (EUA). This EUA will remain  in effect (meaning this test can be used) for the duration of the COVID-19 declaration under Section 564(b)(1) of the Act, 21 U.S.C.section 360bbb-3(b)(1), unless the authorization is terminated  or revoked sooner.       Influenza A by PCR NEGATIVE NEGATIVE Final   Influenza B by PCR NEGATIVE NEGATIVE Final    Comment: (NOTE) The Xpert Xpress SARS-CoV-2/FLU/RSV plus assay is intended as an aid in the diagnosis of influenza from Nasopharyngeal swab specimens and should not be used as a sole basis for treatment. Nasal washings and aspirates are unacceptable for Xpert Xpress SARS-CoV-2/FLU/RSV testing.  Fact Sheet for Patients: EntrepreneurPulse.com.au  Fact Sheet for Healthcare Providers: IncredibleEmployment.be  This test is not yet approved or cleared by the [place] FDA and has been authorized for detection and/or diagnosis of SARS-CoV-2 by FDA under an Emergency Use Authorization (EUA). This EUA will remain in effect (meaning this test can be used) for the duration of the COVID-19 declaration under Section 564(b)(1) of the Act, 21 U.S.C. section 360bbb-3(b)(1), unless the authorization is terminated or revoked.  Performed at [facility], [address] [place], [place] [address]   Urine culture     Status: None   Collection Time: 10/20/2020 10:41 PM   Specimen: In/Out Cath Urine  Result Value Ref Range Status   Specimen Description   Final    IN/OUT CATH URINE Performed at [facility], [address], [place], [place] [address]     Special Requests   Final    NONE Performed at [facility], [address], [place], [place] [address]    Culture   Final    NO GROWTH Performed at [facility], [place] [address], [place], [place] [address]    Report Status 10/19/2020 FINAL  Final  MRSA PCR Screening     Status: None   Collection Time: 10/18/20  8:41 AM   Specimen: Nasopharyngeal  Result Value Ref Range Status   MRSA by PCR NEGATIVE NEGATIVE Final    Comment:        The GeneXpert MRSA Assay (FDA approved for NASAL specimens only), is one component of a comprehensive MRSA colonization surveillance program. It is not intended to diagnose MRSA infection nor to guide or monitor treatment for MRSA infections. Performed at [facility], [address], [place],  [address]      Radiology Studies: [facility] Chest [place] 1 View  Result Date: 10/19/2020 CLINICAL DATA:  70 year old male with shortness of breath. Acute kidney injury. Possible sepsis. EXAM: PORTABLE CHEST 1 VIEW COMPARISON:  Portable chest 09/25/2020 and earlier. FINDINGS: Portable AP upright view at 0912 hours. Stable cardiomegaly and mediastinal contours. Stable left chest AICD. Visualized tracheal air column is within normal limits. Ongoing extensive bilateral pulmonary opacity, with patchy right greater than left lung airspace disease but dense retrocardiac opacity on the left. No pneumothorax. Possible small left pleural effusion. Ventilation has minimally improved since 10/01/2020, and remains worse than on 10/11/2020. Negative visible bowel gas. Stable visualized osseous structures. Chronic right lateral rib fractures. IMPRESSION: Ongoing extensive bilateral pulmonary opacity, minimally improved since 10/18/2020. Top differential considerations are severe pulmonary edema, pneumonia, ARDS. Possible small left pleural effusion with associated left lower lobe collapse or consolidation. Electronically Signed   By: Genevie Ann M.D.   On:  10/19/2020 11:10  DG Chest Port 1 View  Result Date: 10/03/2020 CLINICAL DATA:  Questionable sepsis. EXAM: PORTABLE CHEST 1 VIEW COMPARISON:  10/11/2020 FINDINGS: There is a left chest wall ICD with lead in the right ventricle. Previous median sternotomy and CABG procedure. Stable cardiac enlargement. Bilateral pleural effusions noted, left greater than right. Extensive bilateral interstitial and airspace densities have increased from previous exam. IMPRESSION: 1. Worsening aeration to both lungs. 2. Bilateral pleural effusions, left greater than right. Electronically Signed   By: Kerby Moors M.D.   On: 10/09/2020 18:39    Scheduled Meds: . amiodarone  200 mg Oral Daily  . chlorhexidine  15 mL Mouth Rinse BID  . Chlorhexidine Gluconate Cloth  6 each Topical Daily  . DULoxetine  30 mg Oral QHS  . ferrous sulfate  325 mg Oral Q breakfast  . furosemide  40 mg Intravenous Daily  . mouth rinse  15 mL Mouth Rinse q12n4p  . midodrine  5 mg Oral TID WC  . pantoprazole  40 mg Oral Daily  . sucralfate  1 g Oral BID   Continuous Infusions: . cefTRIAXone (ROCEPHIN)  IV 1 g (10/18/20 1830)  . sodium chloride       LOS: 2 days   Time spent: 40 minutes. More than 50% of the time was spent in counseling/coordination of care  Lorella Nimrod, MD [facility]  If 7PM-7AM, please contact night-coverage [email or web address]  10/19/2020, 1:30 PM   This record has been created using Systems analyst. Errors have been sought and corrected,but may not always be located. Such creation errors do not reflect on the standard of care.

## 2020-10-19 NOTE — Progress Notes (Signed)
SLP Cancellation Note  Patient Details Name: Carl [name] MRN: [id number] DOB: 1951/07/14   Cancelled treatment:       Reason Eval/Treat Not Completed: Medical issues which prohibited therapy;Patient not medically ready (chart reviewed; observed pt). Pt remains on BIPAP w/ some desaturations during any exertion reported per NSG, notes. Pt also had an ultrasound guided Thoracentesis this PM as well. Currently, no appropriate for po trials.  ST services will f/u w/ pt's status tomorrow for ongoing assessment of toleration of diet; education w/ aspiration precautions. Recommend frequent oral care for hygiene and stimulation of swallowing.    Orinda Kenner, MS, CCC-SLP Speech Language Pathologist Rehab Services [phone] [facility] 10/19/2020, 4:54 PM

## 2020-10-19 NOTE — Progress Notes (Signed)
Initial Nutrition Assessment  DOCUMENTATION CODES:   Severe malnutrition in context of chronic illness  INTERVENTION:  Will monitor for diet advancement per SLP and recommend appropriate oral nutrition supplements pending liquid consistency.  NUTRITION DIAGNOSIS:   Severe Malnutrition related to chronic illness (cirrhosis, CHF) as evidenced by severe fat depletion,severe muscle depletion.  GOAL:   Patient will meet greater than or equal to 90% of their needs  MONITOR:   PO intake,Supplement acceptance,Diet advancement,Labs,Weight trends,I & O's  REASON FOR ASSESSMENT:   Malnutrition Screening Tool    ASSESSMENT:   70 year old male with PMHx of psoriasis, hearing loss, PUD, CAD, GERD, arthritis, CHF, anxiety, hx placement of AICD, osteoporosis, gout, cirrhosis of liver, HLD, depression, PVD admitted with sepsis, acute hypoxic respiratory failure, HFrEF (EF 20-25%), failure to thrive, AKI.   Palliative Medicine is following to discuss goals of care with patient/family.  Met with patient at bedside this morning. He was on BiPAP at that time and was having difficulty speaking in sentences to provide history. Spoke with patient's daughter over the phone. She reports patient has had a decreased appetite for a while now. His intake varies so some days he can eat better than others. She reports patient has been losing weight for > 1 year now. UBW is unknown. Recent weights in chart fluctuate between 59-74 kg, likely due to fluid status, so unsure what patient's trend or dry weight is. Daughter concerned about patient not being able to eat while on BiPAP. Since RD assessment patient has now been placed on nasal cannula and SLP consult was ordered. Will monitor for diet recommendations.  Medications reviewed and include: ferrous sulfate 325 mg daily, Lasix 40 mg daily IV, Protonix, Carafate 1 gram BID, ceftriaxone.  Labs reviewed: BUN 32, Creatinine 1.67.  Discussed with RN.  NUTRITION  - FOCUSED PHYSICAL EXAM:  Flowsheet Row Most Recent Value  Orbital Region Severe depletion  Upper Arm Region Severe depletion  Thoracic and Lumbar Region Severe depletion  Buccal Region Unable to assess  Temple Region Severe depletion  Clavicle Bone Region Severe depletion  Clavicle and Acromion Bone Region Severe depletion  Scapular Bone Region Unable to assess  Dorsal Hand Severe depletion  Patellar Region Moderate depletion  Anterior Thigh Region Moderate depletion  Posterior Calf Region Severe depletion  Edema (RD Assessment) None  Hair Reviewed  Eyes Reviewed  Mouth Unable to assess  Skin Reviewed  Nails Reviewed     Diet Order:   Diet Order            Diet NPO time specified Except for: Sips with Meds, Ice Chips  Diet effective now                EDUCATION NEEDS:   No education needs have been identified at this time  Skin:  Skin Assessment: Skin Integrity Issues: Skin Integrity Issues:: Stage I Stage I: sacrum (2cm x 2cm)  Last BM:  10/18/2020 - small type 2  Height:   Ht Readings from Last 1 Encounters:  10/09/2020 _0  (1.778 m)   Weight:   Wt Readings from Last 1 Encounters:  10/13/2020 57.5 kg   BMI:  Body mass index is 18.19 kg/m.  Estimated Nutritional Needs:   Kcal:  1800-2000  Protein:  90-100 grams  Fluid:  >/= 1.7 L/day  Jacklynn Barnacle, MS, RD, LDN Pager number available on Amion

## 2020-10-19 NOTE — Progress Notes (Signed)
Progress Note  Patient Name: Carl [name] Date of Encounter: 10/19/2020  [facility] Cardiologist: No primary care provider on file.   Subjective   No complaints this morning, blood pressure still little bit low, On his midodrine Nurses feel he is more alert today Respiratory distress overnight, placed on BiPAP Still little bit short of breath this morning  Inpatient Medications    Scheduled Meds: . amiodarone  200 mg Oral Daily  . chlorhexidine  15 mL Mouth Rinse BID  . Chlorhexidine Gluconate Cloth  6 each Topical Daily  . DULoxetine  30 mg Oral QHS  . ferrous sulfate  325 mg Oral Q breakfast  . furosemide  40 mg Intravenous Daily  . mouth rinse  15 mL Mouth Rinse q12n4p  . midodrine  5 mg Oral TID WC  . pantoprazole  40 mg Oral Daily  . sucralfate  1 g Oral BID   Continuous Infusions: . cefTRIAXone (ROCEPHIN)  IV 1 g (10/18/20 1830)  . sodium chloride     PRN Meds: acetaminophen **OR** acetaminophen, ondansetron **OR** ondansetron (ZOFRAN) IV   Vital Signs    Vitals:   10/19/20 0800 10/19/20 0900 10/19/20 1000 10/19/20 1100  BP: 91/73 109/78 122/84 (!) 89/77  Pulse:  (!) 108 81 79  Resp: (!) 26 (!) 31 (!) 34 (!) 38  Temp: 98.2 F (36.8 C)     TempSrc: Oral     SpO2: 100% 90% 95% 98%  Weight:      Height:        Intake/Output Summary (Last 24 hours) at 10/19/2020 1500 Last data filed at 10/19/2020 1116 Gross per 24 hour  Intake -  Output 625 ml  Net -625 ml   Last 3 Weights 10/15/2020 10/12/2020 10/14/2020  Weight (lbs) 126 lb 12.2 oz 171 lb 11.8 oz 147 lb 9.6 oz  Weight (kg) 57.5 kg 77.9 kg 66.951 kg      Telemetry    nsr - Personally Reviewed  ECG     - Personally Reviewed  Physical Exam   GEN:  Mild respiratory distress Neck:  JVD 10_ Cardiac: RRR, no murmurs, rubs, or gallops.  Respiratory: Clear to auscultation bilaterally. GI: Soft, nontender, non-distended  MS: No edema; No deformity. Neuro:  Nonfocal  Psych: Normal affect    Labs    High Sensitivity Troponin:   Recent Labs  Lab 10/08/20 1244 10/09/2020 1722 09/25/2020 2107  TROPONINIHS 35* 53* 58*      Chemistry Recent Labs  Lab 10/05/2020 1722 10/18/20 0546 10/19/20 0418  NA 132* 133* 136  K 4.3 3.9 4.0  CL 98 100 102  CO2 24 25 24   GLUCOSE 107* 96 93  BUN 28* 30* 32*  CREATININE 1.95* 1.98* 1.67*  CALCIUM 9.0 8.7* 8.8*  PROT 7.1 6.8  --   ALBUMIN 3.1* 3.0*  --   AST 36 35  --   ALT 30 28  --   ALKPHOS 133* 133*  --   BILITOT 1.6* 1.9*  --   GFRNONAA 37* 36* 44*  ANIONGAP 10 8 10      Hematology Recent Labs  Lab 10/20/2020 1722 10/18/20 0546 10/19/20 0418  WBC 11.5* 11.4* 12.2*  RBC 2.84* 3.00* 3.04*  HGB 8.7* 9.5* 9.1*  HCT 27.4* 28.9* 29.8*  MCV 96.5 96.3 98.0  MCH 30.6 31.7 29.9  MCHC 31.8 32.9 30.5  RDW 17.6* 17.5* 17.2*  PLT 345 348 368    BNP Recent Labs  Lab 10/14/2020 1723  BNP  2,556.7*     DDimer No results for input(s): DDIMER in the last 168 hours.   Radiology    DG Chest Port 1 View  Result Date: 10/19/2020 CLINICAL DATA:  70 year old male with shortness of breath. Acute kidney injury. Possible sepsis. EXAM: PORTABLE CHEST 1 VIEW COMPARISON:  Portable chest 10/16/2020 and earlier. FINDINGS: Portable AP upright view at 0912 hours. Stable cardiomegaly and mediastinal contours. Stable left chest AICD. Visualized tracheal air column is within normal limits. Ongoing extensive bilateral pulmonary opacity, with patchy right greater than left lung airspace disease but dense retrocardiac opacity on the left. No pneumothorax. Possible small left pleural effusion. Ventilation has minimally improved since 10/13/2020, and remains worse than on 10/11/2020. Negative visible bowel gas. Stable visualized osseous structures. Chronic right lateral rib fractures. IMPRESSION: Ongoing extensive bilateral pulmonary opacity, minimally improved since 10/14/2020. Top differential considerations are severe pulmonary edema, pneumonia, ARDS.  Possible small left pleural effusion with associated left lower lobe collapse or consolidation. Electronically Signed   By: Genevie Ann M.D.   On: 10/19/2020 11:10   DG Chest Port 1 View  Result Date: 09/27/2020 CLINICAL DATA:  Questionable sepsis. EXAM: PORTABLE CHEST 1 VIEW COMPARISON:  10/11/2020 FINDINGS: There is a left chest wall ICD with lead in the right ventricle. Previous median sternotomy and CABG procedure. Stable cardiac enlargement. Bilateral pleural effusions noted, left greater than right. Extensive bilateral interstitial and airspace densities have increased from previous exam. IMPRESSION: 1. Worsening aeration to both lungs. 2. Bilateral pleural effusions, left greater than right. Electronically Signed   By: Kerby Moors M.D.   On: 10/09/2020 18:39    Cardiac Studies     Patient Profile    Carl [name] is a 70 y.o. male with a hx of smoking, mild mental retardation, CAD status post CABG x5 in 2015 with LIMA to LAD, SVG to D1, sequential SVG to OM2/OM 3, SVG to PDA, ischemic cardiomyopathy with EF of low 15% status post ICD insertion, MR s/p MitraClip, CKD stage III, melena, left hip fracture, colonic AVMs, cirrhosis who is being seen today for the evaluation of CHF at the request of Dr. Reesa Chew.  Assessment & Plan    Sepsis with hypotension  Turned around with antibiotics, midodrine  IV fluids on hold in the setting of CHF --Follow-up palliative, need to discuss goals of care with family   Pulmonary edema/pleural effusions  Severely depressed ejection fraction by history,  Effusions also exacerbated by anemia, protein calorie malnutrition  IV Lasix twice daily yesterday with improved renal function consistent with cardiorenal syndrome -Received IV Lasix this morning, will continue daily with close monitoring of renal function which is improving --Could consider ultrasound for thoracentesis continue respiratory distress --- Need to discuss goals of care with family   CAD  with stable angina  Minimal elevation of troponin, no plan for ischemic work-up at this time    Mitral valve regurgitation with history of clip  Moderate MR by history  Echo pending   Paroxysmal atrial fibrillation  Not on anticoagulation secondary to GI bleed,  Continue amiodarone,  Unable to tolerate beta-blocker secondary to hypotension  Telemetry confirming normal sinus rhythm   Very sick gentleman, on recent admission palliative following  Recommend family fill out paperwork for POA    Need to discuss goals of care   Discussed with nursing in detail, and with hospitalist service  Total encounter time more than 35 minutes  Greater than 50% was spent in counseling and coordination of care  with the patient     For questions or updates, please contact [place] Please consult [email or web address] for contact info under        Signed, Ida Rogue, MD  10/19/2020, 3:00 PM

## 2020-10-20 DIAGNOSIS — A419 Sepsis, unspecified organism: Secondary | ICD-10-CM | POA: Diagnosis not present

## 2020-10-20 DIAGNOSIS — I959 Hypotension, unspecified: Secondary | ICD-10-CM | POA: Diagnosis not present

## 2020-10-20 LAB — BLOOD GAS, ARTERIAL
Acid-base deficit: 4.1 mmol/L — ABNORMAL HIGH (ref 0.0–2.0)
Bicarbonate: 22.2 mmol/L (ref 20.0–28.0)
Delivery systems: POSITIVE
Expiratory PAP: 5
FIO2: 30
Inspiratory PAP: 10
Mechanical Rate: 10
O2 Saturation: 99.1 %
Patient temperature: 37
pCO2 arterial: 44 mmHg (ref 32.0–48.0)
pH, Arterial: 7.31 — ABNORMAL LOW (ref 7.350–7.450)
pO2, Arterial: 148 mmHg — ABNORMAL HIGH (ref 83.0–108.0)

## 2020-10-20 LAB — BASIC METABOLIC PANEL
Anion gap: 15 (ref 5–15)
BUN: 41 mg/dL — ABNORMAL HIGH (ref 8–23)
CO2: 21 mmol/L — ABNORMAL LOW (ref 22–32)
Calcium: 8.9 mg/dL (ref 8.9–10.3)
Chloride: 103 mmol/L (ref 98–111)
Creatinine, Ser: 2.3 mg/dL — ABNORMAL HIGH (ref 0.61–1.24)
GFR, Estimated: 30 mL/min — ABNORMAL LOW (ref >60)
Glucose, Bld: 101 mg/dL — ABNORMAL HIGH (ref 70–99)
Potassium: 4.5 mmol/L (ref 3.5–5.1)
Sodium: 139 mmol/L (ref 135–145)

## 2020-10-20 LAB — CBC
HCT: 31.1 % — ABNORMAL LOW (ref 39.0–52.0)
Hemoglobin: 9.4 g/dL — ABNORMAL LOW (ref 13.0–17.0)
MCH: 30.1 pg (ref 26.0–34.0)
MCHC: 30.2 g/dL (ref 30.0–36.0)
MCV: 99.7 fL (ref 80.0–100.0)
Platelets: 400 10*3/uL (ref 150–400)
RBC: 3.12 MIL/uL — ABNORMAL LOW (ref 4.22–5.81)
RDW: 17.3 % — ABNORMAL HIGH (ref 11.5–15.5)
WBC: 19.2 10*3/uL — ABNORMAL HIGH (ref 4.0–10.5)
nRBC: 0.2 % (ref 0.0–0.2)

## 2020-10-20 LAB — LACTATE DEHYDROGENASE: LDH: 263 U/L — ABNORMAL HIGH (ref 98–192)

## 2020-10-20 LAB — AMMONIA: Ammonia: 31 umol/L (ref 9–35)

## 2020-10-20 MED ORDER — POLYVINYL ALCOHOL 1.4 % OP SOLN
1.0000 [drp] | Freq: Four times a day (QID) | OPHTHALMIC | Status: DC | PRN
  Filled 2020-10-20: qty 15

## 2020-10-20 MED ORDER — GLYCOPYRROLATE 1 MG PO TABS
1.0000 mg | ORAL_TABLET | ORAL | Status: DC | PRN
  Filled 2020-10-20: qty 1

## 2020-10-20 MED ORDER — ALBUMIN HUMAN 25 % IV SOLN: 25.0000 g | Freq: Once | INTRAVENOUS | Status: DC

## 2020-10-20 MED ORDER — ACETAMINOPHEN 650 MG RE SUPP: 650.0000 mg | Freq: Four times a day (QID) | RECTAL | Status: DC | PRN

## 2020-10-20 MED ORDER — MORPHINE SULFATE (PF) 2 MG/ML IV SOLN
2.0000 mg | INTRAVENOUS | Status: DC | PRN
  Administered 2020-10-20 (×2): 2 mg via INTRAVENOUS
  Filled 2020-10-20 (×2): qty 1

## 2020-10-20 MED ORDER — LORAZEPAM 2 MG/ML PO CONC: 1.0000 mg | ORAL | Status: DC | PRN

## 2020-10-20 MED ORDER — ONDANSETRON 4 MG PO TBDP
4.0000 mg | ORAL_TABLET | Freq: Four times a day (QID) | ORAL | Status: DC | PRN
  Filled 2020-10-20: qty 1

## 2020-10-20 MED ORDER — LORAZEPAM 1 MG PO TABS: 1.0000 mg | ORAL_TABLET | ORAL | Status: DC | PRN

## 2020-10-20 MED ORDER — BIOTENE DRY MOUTH MT LIQD: 15.0000 mL | OROMUCOSAL | Status: DC | PRN

## 2020-10-20 MED ORDER — GLYCOPYRROLATE 0.2 MG/ML IJ SOLN: 0.2000 mg | INTRAMUSCULAR | Status: DC | PRN

## 2020-10-20 MED ORDER — MORPHINE 100MG IN NS 100ML (1MG/ML) PREMIX INFUSION
1.0000 mg/h | INTRAVENOUS | Status: DC
  Administered 2020-10-20 – 2020-10-21 (×2): 1 mg/h via INTRAVENOUS
  Filled 2020-10-20: qty 100

## 2020-10-20 MED ORDER — ONDANSETRON HCL 4 MG/2ML IJ SOLN: 4.0000 mg | Freq: Four times a day (QID) | INTRAMUSCULAR | Status: DC | PRN

## 2020-10-20 MED ORDER — HALOPERIDOL 0.5 MG PO TABS
0.5000 mg | ORAL_TABLET | ORAL | Status: DC | PRN
  Filled 2020-10-20: qty 1

## 2020-10-20 MED ORDER — HALOPERIDOL LACTATE 2 MG/ML PO CONC
0.5000 mg | ORAL | Status: DC | PRN
  Filled 2020-10-20: qty 0.3

## 2020-10-20 MED ORDER — HALOPERIDOL LACTATE 5 MG/ML IJ SOLN: 0.5000 mg | INTRAMUSCULAR | Status: DC | PRN

## 2020-10-20 MED ORDER — MORPHINE SULFATE (PF) 2 MG/ML IV SOLN: 2.0000 mg | Freq: Once | INTRAVENOUS | Status: AC

## 2020-10-20 MED ORDER — ACETAMINOPHEN 325 MG PO TABS: 650.0000 mg | ORAL_TABLET | Freq: Four times a day (QID) | ORAL | Status: DC | PRN

## 2020-10-20 MED ORDER — DIPHENHYDRAMINE HCL 50 MG/ML IJ SOLN: 12.5000 mg | INTRAMUSCULAR | Status: DC | PRN

## 2020-10-20 MED ORDER — MORPHINE SULFATE (PF) 2 MG/ML IV SOLN
INTRAVENOUS | Status: AC
  Administered 2020-10-20: 2 mg via INTRAVENOUS
  Filled 2020-10-20: qty 1

## 2020-10-20 MED ORDER — MORPHINE BOLUS VIA INFUSION
1.0000 mg | INTRAVENOUS | Status: DC | PRN
  Filled 2020-10-20: qty 1

## 2020-10-20 MED ORDER — LORAZEPAM 2 MG/ML IJ SOLN
1.0000 mg | INTRAMUSCULAR | Status: DC | PRN
  Administered 2020-10-20: 1 mg via INTRAVENOUS
  Filled 2020-10-20: qty 1

## 2020-10-20 MED ORDER — ALBUMIN HUMAN 25 % IV SOLN: 25.0000 g | Freq: Two times a day (BID) | INTRAVENOUS | Status: DC | PRN

## 2020-10-20 NOTE — Progress Notes (Signed)
Trialed patient on HFNC 15lpm and patient quickly dropped saturations into the low 70's quickly placed BIPAP mask back on patient saturations rose back into the 90's.

## 2020-10-20 NOTE — Progress Notes (Signed)
SLP Cancellation Note  Patient Details Name: AMAURIS DEBOIS MRN: [id number] DOB: 08-08-1951   Cancelled treatment:          Reason Eval/Treat Not Completed: Medical issues which prohibited therapy;Patient not medically ready. Trail of HFNC unsuccessful this am, back to Bipap. Currently, not appropriate for full diet. ST services will f/u w/ pt's status Monday for ongoing assessment of toleration of diet; education w/ aspiration precautions. Recommend frequent oral care for hygiene and stimulation of swallowing   Lucila Maine 10/20/2020, 12:45 PM

## 2020-10-20 NOTE — Progress Notes (Addendum)
Progress Note  Patient Name: Carl [name] Date of Encounter: 10/20/2020  [facility] Cardiologist: No primary care provider on file.   Subjective   Overnight patient became more confused and combative receiving Morphone. S/p thoracentesis yesterday with 1L clear yellow fluid however breathing status is still poor, on and off Bipap. BP this morning still soft. On midodrine and IV lasix, however with poor UOP.  Creatinine up today. Overall prognosis poos. Daughter is in the room.   Inpatient Medications    Scheduled Meds: . amiodarone  200 mg Oral Daily  . chlorhexidine  15 mL Mouth Rinse BID  . Chlorhexidine Gluconate Cloth  6 each Topical Daily  . DULoxetine  30 mg Oral QHS  . ferrous sulfate  325 mg Oral Q breakfast  . furosemide  40 mg Intravenous Daily  . mouth rinse  15 mL Mouth Rinse q12n4p  . midodrine  5 mg Oral TID WC  . pantoprazole  40 mg Oral Daily  . sucralfate  1 g Oral BID   Continuous Infusions: . albumin human    . cefTRIAXone (ROCEPHIN)  IV Stopped (10/19/20 1735)  . sodium chloride     PRN Meds: acetaminophen **OR** acetaminophen, albumin human, ondansetron **OR** ondansetron (ZOFRAN) IV   Vital Signs    Vitals:   10/20/20 0500 10/20/20 0515 10/20/20 0530 10/20/20 0700  BP: (!) 89/70 108/67  (!) 143/105  Pulse: 92 94 86 79  Resp: (!) 31 (!) 45 (!) 32 (!) 31  Temp:      TempSrc:      SpO2: (!) 85% 93% 100% 94%  Weight:      Height:        Intake/Output Summary (Last 24 hours) at 10/20/2020 0731 Last data filed at 10/20/2020 0000 Gross per 24 hour  Intake 300 ml  Output 125 ml  Net 175 ml   Last 3 Weights 10/10/2020 10/04/2020 10/14/2020  Weight (lbs) 126 lb 12.2 oz 171 lb 11.8 oz 147 lb 9.6 oz  Weight (kg) 57.5 kg 77.9 kg 66.951 kg      Telemetry    SR, HR 80s, PACs/PVCs - Personally Reviewed  ECG    No new - Personally Reviewed  Physical Exam   GEN: moderately agitated   Neck:+ JVD Cardiac: RRR, no murmurs, rubs, or gallops.   Respiratory: Crackles at bases GI: Soft, nontender, non-distended  MS: No edema; No deformity. Neuro:  Nonfocal  Psych: Normal affect   Labs    High Sensitivity Troponin:   Recent Labs  Lab 10/08/20 1244 10/18/2020 1722 10/19/2020 2107  TROPONINIHS 35* 53* 58*      Chemistry Recent Labs  Lab 09/30/2020 1722 10/18/20 0546 10/19/20 0418 10/20/20 0515  NA 132* 133* 136 139  K 4.3 3.9 4.0 4.5  CL 98 100 102 103  CO2 24 25 24  21*  GLUCOSE 107* 96 93 101*  BUN 28* 30* 32* 41*  CREATININE 1.95* 1.98* 1.67* 2.30*  CALCIUM 9.0 8.7* 8.8* 8.9  PROT 7.1 6.8  --   --   ALBUMIN 3.1* 3.0*  --   --   AST 36 35  --   --   ALT 30 28  --   --   ALKPHOS 133* 133*  --   --   BILITOT 1.6* 1.9*  --   --   GFRNONAA 37* 36* 44* 30*  ANIONGAP 10 8 10 15      Hematology Recent Labs  Lab 09/27/2020 1722 10/18/20 0546 10/19/20 0418  WBC 11.5* 11.4* 12.2*  RBC 2.84* 3.00* 3.04*  HGB 8.7* 9.5* 9.1*  HCT 27.4* 28.9* 29.8*  MCV 96.5 96.3 98.0  MCH 30.6 31.7 29.9  MCHC 31.8 32.9 30.5  RDW 17.6* 17.5* 17.2*  PLT 345 348 368    BNP Recent Labs  Lab 10/11/2020 1723  BNP 2,556.7*     DDimer No results for input(s): DDIMER in the last 168 hours.   Radiology    DG Chest Port 1 View  Result Date: 10/19/2020 CLINICAL DATA:  Status post left thoracentesis. EXAM: PORTABLE CHEST 1 VIEW COMPARISON:  October 19, 2020. FINDINGS: Stable cardiomediastinal silhouette. Status post coronary bypass graft. Left-sided pacemaker is unchanged in position. No pneumothorax is noted. Stable bilateral lung opacities are noted concerning for multifocal pneumonia. Small left pleural effusion may be present. Bony thorax is unremarkable. IMPRESSION: Stable bilateral lung opacities are noted concerning for multifocal pneumonia. Small left pleural effusion may be present. No pneumothorax is noted. Electronically Signed   By: Marijo Conception M.D.   On: 10/19/2020 16:09   DG Chest Port 1 View  Result Date:  10/19/2020 CLINICAL DATA:  70 year old male with shortness of breath. Acute kidney injury. Possible sepsis. EXAM: PORTABLE CHEST 1 VIEW COMPARISON:  Portable chest 10/20/2020 and earlier. FINDINGS: Portable AP upright view at 0912 hours. Stable cardiomegaly and mediastinal contours. Stable left chest AICD. Visualized tracheal air column is within normal limits. Ongoing extensive bilateral pulmonary opacity, with patchy right greater than left lung airspace disease but dense retrocardiac opacity on the left. No pneumothorax. Possible small left pleural effusion. Ventilation has minimally improved since 10/14/2020, and remains worse than on 10/11/2020. Negative visible bowel gas. Stable visualized osseous structures. Chronic right lateral rib fractures. IMPRESSION: Ongoing extensive bilateral pulmonary opacity, minimally improved since 10/19/2020. Top differential considerations are severe pulmonary edema, pneumonia, ARDS. Possible small left pleural effusion with associated left lower lobe collapse or consolidation. Electronically Signed   By: Genevie Ann M.D.   On: 10/19/2020 11:10   ECHOCARDIOGRAM COMPLETE  Result Date: 10/19/2020    ECHOCARDIOGRAM REPORT   Patient Name:   LANCE [name] Date of Exam: 10/19/2020 Medical Rec #:  [id number]     Height:       70.0 in Accession #:    [id number]    Weight:       126.8 lb Date of Birth:  [birth year]    BSA:          1.720 m Patient Age:    70 years      BP:           109/78 mmHg Patient Gender: M             HR:           108 bpm. Exam Location:  [facility] Procedure: 2D Echo, Cardiac Doppler, Color Doppler and Strain Analysis Indications:     CHF I50.9  History:         Patient has prior history of Echocardiogram examinations, most                  recent 11/09/2019. Previous Myocardial Infarction;                  Defibrillator.  Sonographer:     Sherrie Sport RDCS (AE) Referring Phys:  [place] Diagnosing Phys: Ida Rogue MD  Sonographer Comments: Global  longitudinal strain was attempted. IMPRESSIONS  1. Left ventricular ejection fraction, by estimation, is 25 to  30%. The left ventricle has severely decreased function. The left ventricle demonstrates global hypokinesis, lateral wall best preserved. The left ventricular internal cavity size was mildly  dilated. The global longitudinal strain is abnormal.  2. Right ventricular systolic function is normal. The right ventricular size is normal. There is moderately elevated pulmonary artery systolic pressure. The estimated right ventricular systolic pressure is 08.6 mmHg.  3. The mitral valve repair/clip in place. Moderate mitral valve regurgitation. No evidence of mitral stenosis.  4. Left atrial size was moderately dilated.  5. Tricuspid valve regurgitation is mild to moderate.  6. The inferior vena cava is dilated in size with <50% respiratory variability, suggesting right atrial pressure of 15 mmHg.  7. Left pleural effusion 8 cm FINDINGS  Left Ventricle: Left ventricular ejection fraction, by estimation, is 25 to 30%. The left ventricle has severely decreased function. The left ventricle demonstrates global hypokinesis. The global longitudinal strain is abnormal. The left ventricular internal cavity size was mildly dilated. There is no left ventricular hypertrophy. Left ventricular diastolic parameters are indeterminate. Right Ventricle: The right ventricular size is normal. No increase in right ventricular wall thickness. Right ventricular systolic function is normal. There is moderately elevated pulmonary artery systolic pressure. The tricuspid regurgitant velocity is 2.71 m/s, and with an assumed right atrial pressure of 20 mmHg, the estimated right ventricular systolic pressure is 57.8 mmHg. Left Atrium: Left atrial size was moderately dilated. Right Atrium: Right atrial size was normal in size. Pericardium: There is no evidence of pericardial effusion. Mitral Valve: The mitral valve is normal in structure.  Moderate mitral valve regurgitation. There is a Mitra-Clip present in the mitral position. No evidence of mitral valve stenosis. MV peak gradient, 10.9 mmHg. The mean mitral valve gradient is 6.0 mmHg. Tricuspid Valve: The tricuspid valve is normal in structure. Tricuspid valve regurgitation is mild to moderate. No evidence of tricuspid stenosis. Aortic Valve: The aortic valve is normal in structure. Aortic valve regurgitation is not visualized. No aortic stenosis is present. Aortic valve mean gradient measures 2.7 mmHg. Aortic valve peak gradient measures 5.1 mmHg. Aortic valve area, by VTI measures 1.59 cm. Pulmonic Valve: The pulmonic valve was normal in structure. Pulmonic valve regurgitation is trivial. No evidence of pulmonic stenosis. Aorta: The aortic root is normal in size and structure. Venous: The inferior vena cava is dilated in size with less than 50% respiratory variability, suggesting right atrial pressure of 15 mmHg. IAS/Shunts: No atrial level shunt detected by color flow Doppler. Additional Comments: A pacer wire is visualized.  LEFT VENTRICLE PLAX 2D LVIDd:         5.67 cm      Diastology LVIDs:         4.86 cm      LV e' medial:    5.00 cm/s LV PW:         0.81 cm      LV E/e' medial:  14.7 LV IVS:        0.95 cm      LV e' lateral:   8.38 cm/s LVOT diam:     2.00 cm      LV E/e' lateral: 8.7 LV SV:         27 LV SV Index:   16 LVOT Area:     3.14 cm                              3D Volume EF: LV  Volumes (MOD)            3D EF:        34 % LV vol d, MOD A2C: 205.0 ml LV EDV:       249 ml LV vol d, MOD A4C: 183.0 ml LV ESV:       165 ml LV vol s, MOD A2C: 133.0 ml LV SV:        84 ml LV vol s, MOD A4C: 109.0 ml LV SV MOD A2C:     72.0 ml LV SV MOD A4C:     183.0 ml LV SV MOD BP:      73.6 ml RIGHT VENTRICLE RV Basal diam:  5.24 cm RV S prime:     9.03 cm/s TAPSE (M-mode): 4.3 cm LEFT ATRIUM              Index       RIGHT ATRIUM           Index LA diam:        4.50 cm  2.62 cm/m  RA Area:      26.00 cm LA Vol (A2C):   125.0 ml 72.69 ml/m RA Volume:   90.30 ml  52.51 ml/m LA Vol (A4C):   94.3 ml  54.84 ml/m LA Biplane Vol: 115.0 ml 66.88 ml/m  AORTIC VALVE                   PULMONIC VALVE AV Area (Vmax):    1.47 cm    PV Vmax:        0.49 m/s AV Area (Vmean):   1.24 cm    PV Peak grad:   1.0 mmHg AV Area (VTI):     1.59 cm    RVOT Peak grad: 1 mmHg AV Vmax:           113.00 cm/s AV Vmean:          74.133 cm/s AV VTI:            0.171 m AV Peak Grad:      5.1 mmHg AV Mean Grad:      2.7 mmHg LVOT Vmax:         52.90 cm/s LVOT Vmean:        29.200 cm/s LVOT VTI:          0.086 m LVOT/AV VTI ratio: 0.51  AORTA Ao Root diam: 3.70 cm MITRAL VALVE               TRICUSPID VALVE MV Area (PHT): 4.26 cm    TR Peak grad:   29.4 mmHg MV Area VTI:   0.69 cm    TR Vmax:        271.00 cm/s MV Peak grad:  10.9 mmHg MV Mean grad:  6.0 mmHg    SHUNTS MV Vmax:       1.65 m/s    Systemic VTI:  0.09 m MV Vmean:      113.0 cm/s  Systemic Diam: 2.00 cm MV Decel Time: 178 msec MV E velocity: 73.30 cm/s MV A velocity: 92.10 cm/s MV E/A ratio:  0.80 Ida Rogue MD Electronically signed by Ida Rogue MD Signature Date/Time: 10/19/2020/4:52:46 PM    Final    US THORACENTESIS ASP PLEURAL SPACE W/IMG GUIDE  Result Date: 10/19/2020 INDICATION: Heart failure, cirrhosis, acute renal insufficiency. Shortness of breath. Respiratory distress. Left greater than right bilateral pleural effusion. Request for diagnostic and therapeutic thoracentesis. EXAM: ULTRASOUND GUIDED LEFT THORACENTESIS  MEDICATIONS: 1% PLAIN LIDOCAINE, 5 ML COMPLICATIONS: None immediate. PROCEDURE: An ultrasound guided thoracentesis was thoroughly discussed with the patient and questions answered. The benefits, risks, alternatives and complications were also discussed. The patient understands and wishes to proceed with the procedure. Written consent was obtained. Ultrasound was performed to localize and mark an adequate pocket of fluid in the left  chest. The area was then prepped and draped in the normal sterile fashion. 1% Lidocaine was used for local anesthesia. Under ultrasound guidance a 6 Fr Safe-T-Centesis catheter was introduced. Thoracentesis was performed. The catheter was removed and a dressing applied. FINDINGS: A total of approximately 1 L of clear yellow fluid was removed. Samples were sent to the laboratory as requested by the clinical team. IMPRESSION: Successful ultrasound guided left thoracentesis yielding 1 L of pleural fluid. Read by: Ascencion Dike PA-C Electronically Signed   By: Ruthann Cancer MD   On: 10/19/2020 15:52    Cardiac Studies   Echo 10/19/20 1. Left ventricular ejection fraction, by estimation, is 25 to 30%. The  left ventricle has severely decreased function. The left ventricle  demonstrates global hypokinesis, lateral wall best preserved. The left  ventricular internal cavity size was mildly  dilated. The global longitudinal strain is abnormal.  2. Right ventricular systolic function is normal. The right ventricular  size is normal. There is moderately elevated pulmonary artery systolic  pressure. The estimated right ventricular systolic pressure is 22.0 mmHg.  3. The mitral valve repair/clip in place. Moderate mitral valve  regurgitation. No evidence of mitral stenosis.  4. Left atrial size was moderately dilated.  5. Tricuspid valve regurgitation is mild to moderate.  6. The inferior vena cava is dilated in size with <50% respiratory  variability, suggesting right atrial pressure of 15 mmHg.  7. Left pleural effusion 8 cm   Patient Profile     70 y.o. male with a hx of smoking, mild mental retardation, CAD status post CABG x5 in 2015 with LIMA to LAD, SVG to D1, sequential SVG to OM2/OM 3, SVG to PDA, ischemic cardiomyopathy with EF of low 15% status post ICD insertion, MR s/p MitraClip, CKD stage III, melena, left hip fracture, colonic AVMs, cirrhosiswho is being seen today for the  evaluation of CHF.  Assessment & Plan    Sepsis/sirs - initially was improving with antibiotics, however last night patient became confused and combative. I will check an ABG - worsening leukocytosis today - no IVF with acute CHF  Pulmonary edema HFrEF - s/p thoracentesis with 1L yellow fluid however patient is still on and off Bipap - IV lasix 40mg  BID. With worsening creatinine will hold lasix - midodrine 5mg  TID for hypotension - echo showed LVEF 25-30%, global hypokinesis, longitudinal strain is abnormal, left pleural effusion 8cm - GDMT limited with hypotension - Can consider central line and coox, however unsure if this is too aggressive . Overall prognosis seems poor. MD to see  CAD with minimally elevated troponin - No plan for ischemic work-up  MR s/p clip - moderate by echo  Paroxysmal Afib - No anticoagulation 2/2 GI bleed - continue amiodarone - No BB with hypotension - Maintaining SR  Disposition/FTT - DNR - palliative care following  For questions or updates, please contact [facility] Please consult [email or web address] for contact info under        Signed, Cadence Ninfa Meeker, PA-C  10/20/2020, 7:31 AM    Patient now transition to comfort care only.  He is on a morphine drip.  Unfortunately, he has had VT therapy via his device.  A magnet has been applied.  The device will be inactivated.  Have reviewed this with the family.  Call us if we can be of further assistance

## 2020-10-20 NOTE — Progress Notes (Signed)
PROGRESS NOTE    Carl [name]  AVW:[id number] DOB: 04-Sep-1950 DOA: 10/16/2020 PCP: [name], MD   Brief Narrative: Taken from H&P. Carl [name] is a 70 y.o. male with medical history significant for previous GI bleed, acute kidney injury, recent admission for shock with chronic GI bleed, heart failure reduced ejection fraction, paroxysmal atrial fibrillation, history of hypertension, depression, liver cirrhosis, coronary artery disease, GERD, hearing loss, Schatzki's ring, tubular adenoma of the colon, peripheral vascular disease, osteoporosis, presents to the emergency department from [facility] for chief concerns of hypoxia on room air. On arrival patient was minimally responsive to sternal rub.  Appears very lethargic.  He was febrile at 101.9, procalcitonin at 0.54, elevated BNP at 2556, hypotensive, chest x-ray with bilateral pleural effusions and no infiltrate, blood and urine cultures negative. Admitted with concern of sepsis and failure to thrive. Sepsis ruled out at this point as there is no source of infection. Patient remained hypoxic and in distress despite doing thoracentesis and removal of 1 L of transudative fluid. Intermittent V. tach which resulted in firing of his ICD. Had another long family meeting with multiple family members and he was transition to full comfort measures.  Subjective: Patient appears in distress, with tachycardia, tachypnea and hypoxia.  Spontaneous body movements as he seems restless.  Not following any commands.  Assessment & Plan:   Principal Problem:   Sepsis associated hypotension (HCC) Active Problems:   Protein-calorie malnutrition, severe (HCC)   AKI (acute kidney injury) ([place])   S/P CABG x 5   Hyperlipidemia   Essential hypertension   CAD (coronary artery disease)   GERD (gastroesophageal reflux disease)   Depression   Symptomatic anemia   Chronic systolic CHF (congestive heart failure) (HCC)   Cirrhosis (HCC)   Hyponatremia    Acute kidney injury superimposed on CKD (HCC)   Leukocytosis   HFrEF (heart failure with reduced ejection fraction) (HCC)   Paroxysmal atrial fibrillation (HCC)   Failure to thrive in adult  Sepsis/SIRS.  Patient met SIRS criteria initially with fever, leukocytosis, tachypnea, AKI and encephalopathy.  Procalcitonin at 0.54.  Chest x-ray is not very impressive for pneumonia.  Blood and urine cultures negative at this time. Sepsis ruled out. He was started on broad-spectrum antibiotics with cefepime and vancomycin.  Did not received much fluid with HFrEF and concern of pulmonary edema.   He was also started on midodrine to keep the MAP greater than 65. No obvious source of infection at this time.  MRSA swab negative, COVID-19 negative. Sepsis ruled out at this point. Vancomycin was discontinued earlier and cefepime was switched to ceftriaxone. Thoracentesis was done yesterday with removal of 1 L of transudative fluid, cultures negative with no change in his respiratory status. Becoming more tachycardic, tachypneic and hypoxic despite being on BiPAP.  Appears in distress.  Worsening renal function.  Patient is in multiorgan failure. Had another discussion with multiple family members which include his daughter, brother and sister and he was transitioned to full comfort measures. -Anticipating hospital death.  Acute hypoxic respiratory failure.  Currently requiring BiPAP.  Most likely secondary to acute on chronic HFrEF. -Repeat chest x-ray with worsening aeration and bilateral pleural effusions, left more than right.  We will try thoracentesis with no help Patient is now full comfort measures only, started on morphine to prevent air hunger.  HFrEF.  Patient has an history of severe ischemic cardiomyopathy and coronary artery disease s/p CABG and severe mitral valve regurgitation s/p mitral valve clip.  EF of 20 to 25%.  Not a candidate for any further aggressive measures.  His home dose of  carvedilol, metolazone and torsemide were discontinued during prior hospitalization due to softer blood pressure. Patient appears in pulmonary edema.  BNP at [facility] was consulted-we appreciate their help, according to their note very poor prognosis. Repeat echocardiogram done-similar EF, global hypokinesis and longitudinal strain which is abnormal. Unable to tolerate Lasix due to worsening renal function and poor urinary output.  Failure to thrive.  Patient was recently discharged to SNF and at that time his heart failure medications were discontinued due to softer blood pressure.  Outpatient palliative care follow-up was recommended. -Palliative care consult-patient is appropriate for comfort measures/hospice care. -Patient is very high risk for deterioration and death.  AKI.  Most likely secondary to cardiorenal. Worsening renal function-now transition to full comfort measures only.  Liver cirrhosis.  Meld sodium score of 23. Not a candidate for transplant. -Continue with supportive care  Paroxysmal atrial fibrillation.  Currently in sinus rhythm, with intermittent V. Tach. -Not a candidate for anticoagulation due to recent GI bleed. -Position to full comfort measures only  Chronic anemia with recent GI bleed.  Anticoagulation was discontinued.  Hemoglobin stable at 9.1.  History of depression. -Discontinue home dose of Cymbalta as he was transitioned to full comfort measures only.  History of gout.  No acute concern.  GERD. -Continue Protonix  Objective: Vitals:   10/20/20 0530 10/20/20 0700 10/20/20 0900 10/20/20 1000  BP:  (!) 143/105 (!) 82/52 (!) 93/57  Pulse: 86 79 83 85  Resp: (!) 32 (!) 31 (!) 24 (!) 23  Temp:      TempSrc:      SpO2: 100% 94% 99% 99%  Weight:      Height:        Intake/Output Summary (Last 24 hours) at 10/20/2020 1315 Last data filed at 10/20/2020 1111 Gross per 24 hour  Intake 305.29 ml  Output --  Net 305.29 ml   Filed Weights    10/09/2020 1720 10/12/2020 2115  Weight: 77.9 kg 57.5 kg    Examination:  General.  Chronically ill-appearing gentleman, appears in distress with thrashing around the bed, not following any command. Pulmonary.  Lungs clear bilaterally, tachypneic and increased work of breathing. CV.  Sinus tachycardia Abdomen.  Soft, nontender,  BS positive. CNS.  Patient is awake but not following any command, appears in distress. Extremities.  No edema, no cyanosis, pulses intact and symmetrical. Psychiatry.  Judgment and insight appears impaired.  DVT prophylaxis: SCDs Code Status: DNR Family Communication: Discussed with multiple family members at bedside before transitioning him to full comfort measure. Disposition Plan:  Status is: Inpatient  Remains inpatient appropriate because:Inpatient level of care appropriate due to severity of illness   Dispo: The patient is from: SNF              Anticipated d/c is to: Anticipating hospital death.              Patient currently is not medically stable to d/c.   Difficult to place patient               Level of care: Palliative Care  All the records are reviewed and case discussed with Care Management/Social Worker. Management plans discussed with the patient, nursing and they are in agreement.  Consultants:   Palliative care  Cardiology  Procedures:  Antimicrobials:   Data Reviewed: I have personally reviewed following labs and imaging studies  CBC: Recent Labs  Lab 10/14/20 0327 10/01/2020 1722 10/18/20 0546 10/19/20 0418 10/20/20 0515  WBC 5.4 11.5* 11.4* 12.2* 19.2*  NEUTROABS  --  9.8* 9.7*  --   --   HGB 8.4* 8.7* 9.5* 9.1* 9.4*  HCT 27.0* 27.4* 28.9* 29.8* 31.1*  MCV 98.5 96.5 96.3 98.0 99.7  PLT 297 345 348 368 532   Basic Metabolic Panel: Recent Labs  Lab 10/14/20 0327 10/15/20 0520 10/16/20 0538 09/28/2020 1722 10/18/20 0546 10/19/20 0418 10/20/20 0515  NA 135  --   --  132* 133* 136 139  K 3.9  --   --  4.3 3.9 4.0  4.5  CL 103  --   --  98 100 102 103  CO2 23  --   --  '24 25 24 ' 21*  GLUCOSE 94  --   --  107* 96 93 101*  BUN 23  --   --  28* 30* 32* 41*  CREATININE 1.29*  --   --  1.95* 1.98* 1.67* 2.30*  CALCIUM 8.7*  --   --  9.0 8.7* 8.8* 8.9  MG 2.2 2.0 2.1  --   --   --   --   PHOS 3.6 3.6 3.4  --   --   --   --    GFR: Estimated Creatinine Clearance: 24.7 mL/min (A) (by C-G formula based on SCr of 2.3 mg/dL (H)). Liver Function Tests: Recent Labs  Lab 10/04/2020 1722 10/18/20 0546  AST 36 35  ALT 30 28  ALKPHOS 133* 133*  BILITOT 1.6* 1.9*  PROT 7.1 6.8  ALBUMIN 3.1* 3.0*   No results for input(s): LIPASE, AMYLASE in the last 168 hours. Recent Labs  Lab 10/20/20 0515  AMMONIA 31   Coagulation Profile: Recent Labs  Lab 09/27/2020 1722 10/18/20 0546  INR 1.4* 1.4*   Cardiac Enzymes: No results for input(s): CKTOTAL, CKMB, CKMBINDEX, TROPONINI in the last 168 hours. BNP (last 3 results) No results for input(s): PROBNP in the last 8760 hours. HbA1C: No results for input(s): HGBA1C in the last 72 hours. CBG: Recent Labs  Lab 10/06/2020 2100 10/18/20 0301  GLUCAP 92 96   Lipid Profile: No results for input(s): CHOL, HDL, LDLCALC, TRIG, CHOLHDL, LDLDIRECT in the last 72 hours. Thyroid Function Tests: No results for input(s): TSH, T4TOTAL, FREET4, T3FREE, THYROIDAB in the last 72 hours. Anemia Panel: No results for input(s): VITAMINB12, FOLATE, FERRITIN, TIBC, IRON, RETICCTPCT in the last 72 hours. Sepsis Labs: Recent Labs  Lab 10/20/2020 1723 10/03/2020 2107 10/18/20 0546  PROCALCITON  --   --  0.54  LATICACIDVEN 1.6 1.5  --     Recent Results (from the past 240 hour(s))  Resp Panel by RT-PCR (Flu A&B, Covid) Nasopharyngeal Swab     Status: None   Collection Time: 10/16/20 11:22 AM   Specimen: Nasopharyngeal Swab; Nasopharyngeal(NP) swabs in vial transport medium  Result Value Ref Range Status   SARS Coronavirus 2 by RT PCR NEGATIVE NEGATIVE Final    Comment:  (NOTE) SARS-CoV-2 target nucleic acids are NOT DETECTED.  The SARS-CoV-2 RNA is generally detectable in upper respiratory specimens during the acute phase of infection. The lowest concentration of SARS-CoV-2 viral copies this assay can detect is 138 copies/mL. A negative result does not preclude SARS-Cov-2 infection and should not be used as the sole basis for treatment or other patient management decisions. A negative result may occur with  improper specimen collection/handling, submission of specimen other than nasopharyngeal swab, presence  of viral mutation(s) within the areas targeted by this assay, and inadequate number of viral copies(<138 copies/mL). A negative result must be combined with clinical observations, patient history, and epidemiological information. The expected result is Negative.  Fact Sheet for Patients:  EntrepreneurPulse.com.au  Fact Sheet for Healthcare Providers:  IncredibleEmployment.be  This test is no t yet approved or cleared by the [place] FDA and  has been authorized for detection and/or diagnosis of SARS-CoV-2 by FDA under an Emergency Use Authorization (EUA). This EUA will remain  in effect (meaning this test can be used) for the duration of the COVID-19 declaration under Section 564(b)(1) of the Act, 21 U.S.C.section 360bbb-3(b)(1), unless the authorization is terminated  or revoked sooner.       Influenza A by PCR NEGATIVE NEGATIVE Final   Influenza B by PCR NEGATIVE NEGATIVE Final    Comment: (NOTE) The Xpert Xpress SARS-CoV-2/FLU/RSV plus assay is intended as an aid in the diagnosis of influenza from Nasopharyngeal swab specimens and should not be used as a sole basis for treatment. Nasal washings and aspirates are unacceptable for Xpert Xpress SARS-CoV-2/FLU/RSV testing.  Fact Sheet for Patients: EntrepreneurPulse.com.au  Fact Sheet for Healthcare  Providers: IncredibleEmployment.be  This test is not yet approved or cleared by the [place] FDA and has been authorized for detection and/or diagnosis of SARS-CoV-2 by FDA under an Emergency Use Authorization (EUA). This EUA will remain in effect (meaning this test can be used) for the duration of the COVID-19 declaration under Section 564(b)(1) of the Act, 21 U.S.C. section 360bbb-3(b)(1), unless the authorization is terminated or revoked.  Performed at [facility], [address] [place], [place] [address]   Blood Culture (routine x 2)     Status: None (Preliminary result)   Collection Time: 09/25/2020  5:23 PM   Specimen: BLOOD  Result Value Ref Range Status   Specimen Description BLOOD BLOOD LEFT FOREARM  Final   Special Requests   Final    BOTTLES DRAWN AEROBIC AND ANAEROBIC Blood Culture adequate volume   Culture   Final    NO GROWTH 3 DAYS Performed at [facility], [name], [place], [place] [address]    Report Status PENDING  Incomplete  Blood Culture (routine x 2)     Status: None (Preliminary result)   Collection Time:   5:23 PM   Specimen: BLOOD  Result Value Ref Range Status   Specimen Description BLOOD LEFT ANTECUBITAL  Final   Special Requests   Final    BOTTLES DRAWN AEROBIC AND ANAEROBIC Blood Culture adequate volume   Culture   Final    NO GROWTH 3 DAYS Performed at [facility], [address], [place], [place] [address]    Report Status PENDING  Incomplete  Resp Panel by RT-PCR (Flu A&B, Covid) Nasopharyngeal Swab     Status: None   Collection Time: 10/19/2020  5:39 PM   Specimen: Nasopharyngeal Swab; Nasopharyngeal(NP) swabs in vial transport medium  Result Value Ref Range Status   SARS Coronavirus 2 by RT PCR NEGATIVE NEGATIVE Final    Comment: (NOTE) SARS-CoV-2 target nucleic acids are NOT DETECTED.  The SARS-CoV-2 RNA is generally detectable in upper respiratory specimens during  the acute phase of infection. The lowest concentration of SARS-CoV-2 viral copies this assay can detect is 138 copies/mL. A negative result does not preclude SARS-Cov-2 infection and should not be used as the sole basis for treatment or other patient management decisions. A negative result may occur  with  improper specimen collection/handling, submission of specimen other than nasopharyngeal swab, presence of viral mutation(s) within the areas targeted by this assay, and inadequate number of viral copies(<138 copies/mL). A negative result must be combined with clinical observations, patient history, and epidemiological information. The expected result is Negative.  Fact Sheet for Patients:  EntrepreneurPulse.com.au  Fact Sheet for Healthcare Providers:  IncredibleEmployment.be  This test is no t yet approved or cleared by the [place] FDA and  has been authorized for detection and/or diagnosis of SARS-CoV-2 by FDA under an Emergency Use Authorization (EUA). This EUA will remain  in effect (meaning this test can be used) for the duration of the COVID-19 declaration under Section 564(b)(1) of the Act, 21 U.S.C.section 360bbb-3(b)(1), unless the authorization is terminated  or revoked sooner.       Influenza A by PCR NEGATIVE NEGATIVE Final   Influenza B by PCR NEGATIVE NEGATIVE Final    Comment: (NOTE) The Xpert Xpress SARS-CoV-2/FLU/RSV plus assay is intended as an aid in the diagnosis of influenza from Nasopharyngeal swab specimens and should not be used as a sole basis for treatment. Nasal washings and aspirates are unacceptable for Xpert Xpress SARS-CoV-2/FLU/RSV testing.  Fact Sheet for Patients: EntrepreneurPulse.com.au  Fact Sheet for Healthcare Providers: IncredibleEmployment.be  This test is not yet approved or cleared by the [place] FDA and has been authorized for detection and/or  diagnosis of SARS-CoV-2 by FDA under an Emergency Use Authorization (EUA). This EUA will remain in effect (meaning this test can be used) for the duration of the COVID-19 declaration under Section 564(b)(1) of the Act, 21 U.S.C. section 360bbb-3(b)(1), unless the authorization is terminated or revoked.  Performed at [facility], [address] [place], [place] [address]   Urine culture     Status: None   Collection Time: 10/12/2020 10:41 PM   Specimen: In/Out Cath Urine  Result Value Ref Range Status   Specimen Description   Final    IN/OUT CATH URINE Performed at [facility], [address] [place], [place] [address]    Special Requests   Final    NONE Performed at [facility], [address], [place], [place] [address]    Culture   Final    NO GROWTH Performed at [facility], [place] [address] [place], [place] [address]    Report Status 10/19/2020 FINAL  Final  MRSA PCR Screening     Status: None   Collection Time: 10/18/20  8:41 AM   Specimen: Nasopharyngeal  Result Value Ref Range Status   MRSA by PCR NEGATIVE NEGATIVE Final    Comment:        The GeneXpert MRSA Assay (FDA approved for NASAL specimens only), is one component of a comprehensive MRSA colonization surveillance program. It is not intended to diagnose MRSA infection nor to guide or monitor treatment for MRSA infections. Performed at [facility], [address] [place], [place] [address]   Body fluid culture w Gram Stain     Status: None (Preliminary result)   Collection Time: 10/19/20  3:42 PM   Specimen: PATH Cytology Pleural fluid  Result Value Ref Range Status   Specimen Description   Final    PLEURAL Performed at [facility], [address], [place], [place] [address]    Special Requests   Final    NONE Performed at [facility], [address] [place],  [address]    Gram Stain   Final  NO WBC SEEN NO  ORGANISMS SEEN Performed at [facility], [place] [address] [place], [place] [address]    Culture PENDING  Incomplete   Report Status PENDING  Incomplete     Radiology Studies: DG Chest Port 1 View  Result Date: 10/19/2020 CLINICAL DATA:  Status post left thoracentesis. EXAM: PORTABLE CHEST 1 VIEW COMPARISON:  October 19, 2020. FINDINGS: Stable cardiomediastinal silhouette. Status post coronary bypass graft. Left-sided pacemaker is unchanged in position. No pneumothorax is noted. Stable bilateral lung opacities are noted concerning for multifocal pneumonia. Small left pleural effusion may be present. Bony thorax is unremarkable. IMPRESSION: Stable bilateral lung opacities are noted concerning for multifocal pneumonia. Small left pleural effusion may be present. No pneumothorax is noted. Electronically Signed   By: Marijo Conception M.D.   On: 10/19/2020 16:09   DG Chest Port 1 View  Result Date: 10/19/2020 CLINICAL DATA:  70 year old male with shortness of breath. Acute kidney injury. Possible sepsis. EXAM: PORTABLE CHEST 1 VIEW COMPARISON:  Portable chest 10/10/2020 and earlier. FINDINGS: Portable AP upright view at 0912 hours. Stable cardiomegaly and mediastinal contours. Stable left chest AICD. Visualized tracheal air column is within normal limits. Ongoing extensive bilateral pulmonary opacity, with patchy right greater than left lung airspace disease but dense retrocardiac opacity on the left. No pneumothorax. Possible small left pleural effusion. Ventilation has minimally improved since , and remains worse than on 10/11/2020. Negative visible bowel gas. Stable visualized osseous structures. Chronic right lateral rib fractures. IMPRESSION: Ongoing extensive bilateral pulmonary opacity, minimally improved since 09/26/2020. Top differential considerations are severe pulmonary edema, pneumonia, ARDS. Possible small left pleural effusion with associated left lower lobe collapse or  consolidation. Electronically Signed   By: Genevie Ann M.D.   On: 10/19/2020 11:10   ECHOCARDIOGRAM COMPLETE  Result Date: 10/19/2020    ECHOCARDIOGRAM REPORT   Patient Name:   Carl [name] Date of Exam: 10/19/2020 Medical Rec #:  [id number]     Height:       70.0 in Accession #:    [id number]    Weight:       126.8 lb Date of Birth:  Jul 10, 1951    BSA:          1.720 m Patient Age:    7 years      BP:           109/78 mmHg Patient Gender: M             HR:           108 bpm. Exam Location:  [facility] Procedure: 2D Echo, Cardiac Doppler, Color Doppler and Strain Analysis Indications:     CHF I50.9  History:         Patient has prior history of Echocardiogram examinations, most                  recent 11/09/2019. Previous Myocardial Infarction;                  Defibrillator.  Sonographer:     Sherrie Sport RDCS (AE) Referring Phys:  [place] Diagnosing Phys: Ida Rogue MD  Sonographer Comments: Global longitudinal strain was attempted. IMPRESSIONS  1. Left ventricular ejection fraction, by estimation, is 25 to 30%. The left ventricle has severely decreased function. The left ventricle demonstrates global hypokinesis, lateral wall best preserved. The left ventricular internal cavity size was mildly  dilated. The global longitudinal strain is abnormal.  2. Right ventricular systolic  function is normal. The right ventricular size is normal. There is moderately elevated pulmonary artery systolic pressure. The estimated right ventricular systolic pressure is 78.2 mmHg.  3. The mitral valve repair/clip in place. Moderate mitral valve regurgitation. No evidence of mitral stenosis.  4. Left atrial size was moderately dilated.  5. Tricuspid valve regurgitation is mild to moderate.  6. The inferior vena cava is dilated in size with <50% respiratory variability, suggesting right atrial pressure of 15 mmHg.  7. Left pleural effusion 8 cm FINDINGS  Left Ventricle: Left ventricular ejection fraction, by estimation, is 25  to 30%. The left ventricle has severely decreased function. The left ventricle demonstrates global hypokinesis. The global longitudinal strain is abnormal. The left ventricular internal cavity size was mildly dilated. There is no left ventricular hypertrophy. Left ventricular diastolic parameters are indeterminate. Right Ventricle: The right ventricular size is normal. No increase in right ventricular wall thickness. Right ventricular systolic function is normal. There is moderately elevated pulmonary artery systolic pressure. The tricuspid regurgitant velocity is 2.71 m/s, and with an assumed right atrial pressure of 20 mmHg, the estimated right ventricular systolic pressure is 95.6 mmHg. Left Atrium: Left atrial size was moderately dilated. Right Atrium: Right atrial size was normal in size. Pericardium: There is no evidence of pericardial effusion. Mitral Valve: The mitral valve is normal in structure. Moderate mitral valve regurgitation. There is a Mitra-Clip present in the mitral position. No evidence of mitral valve stenosis. MV peak gradient, 10.9 mmHg. The mean mitral valve gradient is 6.0 mmHg. Tricuspid Valve: The tricuspid valve is normal in structure. Tricuspid valve regurgitation is mild to moderate. No evidence of tricuspid stenosis. Aortic Valve: The aortic valve is normal in structure. Aortic valve regurgitation is not visualized. No aortic stenosis is present. Aortic valve mean gradient measures 2.7 mmHg. Aortic valve peak gradient measures 5.1 mmHg. Aortic valve area, by VTI measures 1.59 cm. Pulmonic Valve: The pulmonic valve was normal in structure. Pulmonic valve regurgitation is trivial. No evidence of pulmonic stenosis. Aorta: The aortic root is normal in size and structure. Venous: The inferior vena cava is dilated in size with less than 50% respiratory variability, suggesting right atrial pressure of 15 mmHg. IAS/Shunts: No atrial level shunt detected by color flow Doppler. Additional  Comments: A pacer wire is visualized.  LEFT VENTRICLE PLAX 2D LVIDd:         5.67 cm      Diastology LVIDs:         4.86 cm      LV e' medial:    5.00 cm/s LV PW:         0.81 cm      LV E/e' medial:  14.7 LV IVS:        0.95 cm      LV e' lateral:   8.38 cm/s LVOT diam:     2.00 cm      LV E/e' lateral: 8.7 LV SV:         27 LV SV Index:   16 LVOT Area:     3.14 cm                              3D Volume EF: LV Volumes (MOD)            3D EF:        34 % LV vol d, MOD A2C: 205.0 ml LV EDV:  249 ml LV vol d, MOD A4C: 183.0 ml LV ESV:       165 ml LV vol s, MOD A2C: 133.0 ml LV SV:        84 ml LV vol s, MOD A4C: 109.0 ml LV SV MOD A2C:     72.0 ml LV SV MOD A4C:     183.0 ml LV SV MOD BP:      73.6 ml RIGHT VENTRICLE RV Basal diam:  5.24 cm RV S prime:     9.03 cm/s TAPSE (M-mode): 4.3 cm LEFT ATRIUM              Index       RIGHT ATRIUM           Index LA diam:        4.50 cm  2.62 cm/m  RA Area:     26.00 cm LA Vol (A2C):   125.0 ml 72.69 ml/m RA Volume:   90.30 ml  52.51 ml/m LA Vol (A4C):   94.3 ml  54.84 ml/m LA Biplane Vol: 115.0 ml 66.88 ml/m  AORTIC VALVE                   PULMONIC VALVE AV Area (Vmax):    1.47 cm    PV Vmax:        0.49 m/s AV Area (Vmean):   1.24 cm    PV Peak grad:   1.0 mmHg AV Area (VTI):     1.59 cm    RVOT Peak grad: 1 mmHg AV Vmax:           113.00 cm/s AV Vmean:          74.133 cm/s AV VTI:            0.171 m AV Peak Grad:      5.1 mmHg AV Mean Grad:      2.7 mmHg LVOT Vmax:         52.90 cm/s LVOT Vmean:        29.200 cm/s LVOT VTI:          0.086 m LVOT/AV VTI ratio: 0.51  AORTA Ao Root diam: 3.70 cm MITRAL VALVE               TRICUSPID VALVE MV Area (PHT): 4.26 cm    TR Peak grad:   29.4 mmHg MV Area VTI:   0.69 cm    TR Vmax:        271.00 cm/s MV Peak grad:  10.9 mmHg MV Mean grad:  6.0 mmHg    SHUNTS MV Vmax:       1.65 m/s    Systemic VTI:  0.09 m MV Vmean:      113.0 cm/s  Systemic Diam: 2.00 cm MV Decel Time: 178 msec MV E velocity: 73.30 cm/s MV A velocity:  92.10 cm/s MV E/A ratio:  0.80 Ida Rogue MD Electronically signed by Ida Rogue MD Signature Date/Time: 10/19/2020/4:52:46 PM    Final    US THORACENTESIS ASP PLEURAL SPACE W/IMG GUIDE  Result Date: 10/19/2020 INDICATION: Heart failure, cirrhosis, acute renal insufficiency. Shortness of breath. Respiratory distress. Left greater than right bilateral pleural effusion. Request for diagnostic and therapeutic thoracentesis. EXAM: ULTRASOUND GUIDED LEFT THORACENTESIS MEDICATIONS: 1% PLAIN LIDOCAINE, 5 ML COMPLICATIONS: None immediate. PROCEDURE: An ultrasound guided thoracentesis was thoroughly discussed with the patient and questions answered. The benefits, risks, alternatives and complications were also discussed. The patient understands and wishes to proceed  with the procedure. Written consent was obtained. Ultrasound was performed to localize and mark an adequate pocket of fluid in the left chest. The area was then prepped and draped in the normal sterile fashion. 1% Lidocaine was used for local anesthesia. Under ultrasound guidance a 6 Fr Safe-T-Centesis catheter was introduced. Thoracentesis was performed. The catheter was removed and a dressing applied. FINDINGS: A total of approximately 1 L of clear yellow fluid was removed. Samples were sent to the laboratory as requested by the clinical team. IMPRESSION: Successful ultrasound guided left thoracentesis yielding 1 L of pleural fluid. Read by: Ascencion Dike PA-C Electronically Signed   By: Ruthann Cancer MD   On: 10/19/2020 15:52    Scheduled Meds: . chlorhexidine  15 mL Mouth Rinse BID  . Chlorhexidine Gluconate Cloth  6 each Topical Daily  . mouth rinse  15 mL Mouth Rinse q12n4p  . sucralfate  1 g Oral BID   Continuous Infusions: . morphine 1 mg/hr (10/20/20 1111)     LOS: 3 days   Time spent: 40 minutes. More than 50% of the time was spent in counseling/coordination of care  Lorella Nimrod, MD [facility]  If 7PM-7AM, please  contact night-coverage [email or web address]  10/20/2020, 1:15 PM   This record has been created using Systems analyst. Errors have been sought and corrected,but may not always be located. Such creation errors do not reflect on the standard of care.

## 2020-10-20 NOTE — Progress Notes (Signed)
Per request of staff, I met with family members bedside.Family requested prayer.

## 2020-10-21 DIAGNOSIS — A419 Sepsis, unspecified organism: Secondary | ICD-10-CM | POA: Diagnosis not present

## 2020-10-21 DIAGNOSIS — Z7189 Other specified counseling: Secondary | ICD-10-CM | POA: Diagnosis not present

## 2020-10-21 DIAGNOSIS — Z515 Encounter for palliative care: Secondary | ICD-10-CM

## 2020-10-21 DIAGNOSIS — I5022 Chronic systolic (congestive) heart failure: Secondary | ICD-10-CM | POA: Diagnosis not present

## 2020-10-21 DIAGNOSIS — Z66 Do not resuscitate: Secondary | ICD-10-CM

## 2020-10-21 DIAGNOSIS — R627 Adult failure to thrive: Secondary | ICD-10-CM | POA: Diagnosis not present

## 2020-10-21 LAB — GLUCOSE, CAPILLARY: Glucose-Capillary: <10 mg/dL — CL (ref 70–99)

## 2020-10-21 LAB — PROTEIN, BODY FLUID (OTHER): Total Protein, Body Fluid Other: 1.7 g/dL

## 2020-10-21 MED ORDER — MORPHINE SULFATE (PF) 2 MG/ML IV SOLN: 2.0000 mg | INTRAVENOUS | Status: DC | PRN

## 2020-10-21 MED ORDER — DEXTROSE 50 % IV SOLN
1.0000 | Freq: Once | INTRAVENOUS | Status: AC
  Administered 2020-10-21: 08:00:00 50 mL via INTRAVENOUS

## 2020-10-21 MED ORDER — DEXTROSE 50 % IV SOLN
INTRAVENOUS | Status: AC
  Filled 2020-10-21: qty 50

## 2020-10-22 LAB — CULTURE, BLOOD (ROUTINE X 2)
Culture: NO GROWTH
Culture: NO GROWTH
Special Requests: ADEQUATE
Special Requests: ADEQUATE

## 2020-10-23 ENCOUNTER — Encounter (HOSPITAL_COMMUNITY): Payer: Medicare Other

## 2020-10-23 LAB — BODY FLUID CULTURE W GRAM STAIN
Culture: NO GROWTH
Gram Stain: NONE SEEN

## 2020-10-23 LAB — CYTOLOGY - NON PAP

## 2020-10-23 NOTE — Death Summary Note (Signed)
Death Summary  Carl [name] CNO:[id number] DOB: [birth year] DOA: 11/14/2020  PCP: [name], MD  Admit date: 2020/11/14 Date of Death: 11/18/2020 Time of Death: 13:28 Notification: [name], MD notified of death of 11-18-20   History of present illness:  Carl [name] is a 70 y.o. male with a history of previous GI bleed, acute kidney injury, recent admission for shock with chronic GI bleed, HFrEF(EF 20% with ICD in place), paroxysmal atrial fibrillation, history of hypertension, depression, liver cirrhosis, coronary artery disease, GERD, hearing loss, Schatzki's ring, tubular adenoma of the colon, peripheral vascular disease, osteoporosis, and failure to thrive. Carl [name] presented with complaint of hypoxia on room air and decreased responsiveness from his facility.  There was ongoing discussion regarding comfort measures during recent hospitalization and then family decided to send him to SNF with palliative care. Carl [name] did not improve after empiric antibiotics, continue to require BiPAP and desaturating intermittently.  Decreased responsiveness.  Palliative care was involved but there was some discrepancy among different family members.  Patient becomes more restless and in distress on 10/20/2020 required another family meeting at bedside and after seeing his agony they decided to proceed with full comfort measures only. Patient was started on morphine, which was later discontinued at family's request as they were thinking that his decreased responsiveness is due to morphine.  On the day of death he was completely unresponsive with apneic spells without any morphine.  Appears comfortable. Later patient passed around 1:30 PM with multiple family members at bedside.  Final Diagnoses:  1.   HFrEF   The results of significant diagnostics from this hospitalization (including imaging, microbiology, ancillary and laboratory) are listed below for reference.     Significant Diagnostic Studies: US RENAL  Result Date: 10/09/2020 CLINICAL DATA:  Acute renal failure. EXAM: RENAL / URINARY TRACT ULTRASOUND COMPLETE COMPARISON:  None. FINDINGS: Right Kidney: Renal measurements: 9.3 x 4.7 x 3.7 cm = volume: 85.1 mL. Echogenicity within normal limits. No mass or hydronephrosis visualized. Left Kidney: Renal measurements: 9.5 x 5.7 x 4.7 cm = volume: 134.2 mL. Echogenicity within normal limits. No mass or hydronephrosis visualized. Bladder: Bladder is decompressed with a Foley catheter. Other: Incidental note is made of a heterogeneous lobular hepatic parenchymal pattern. Cirrhosis cannot be excluded. Mild ascites noted. Pleural effusions noted. IMPRESSION: 1. No acute renal abnormality identified. No hydronephrosis or focal renal abnormality. 2. Bladder is decompressed with a Foley catheter. 3. Incidental note is made of a heterogeneous lobular hepatic parenchymal pattern. Cirrhosis cannot be excluded. Mild ascites noted. Pleural effusions noted. Electronically Signed   By: Marcello Moores  Register   On: 10/09/2020 07:45   DG Chest Port 1 View  Result Date: 10/19/2020 CLINICAL DATA:  Status post left thoracentesis. EXAM: PORTABLE CHEST 1 VIEW COMPARISON:  October 19, 2020. FINDINGS: Stable cardiomediastinal silhouette. Status post coronary bypass graft. Left-sided pacemaker is unchanged in position. No pneumothorax is noted. Stable bilateral lung opacities are noted concerning for multifocal pneumonia. Small left pleural effusion may be present. Bony thorax is unremarkable. IMPRESSION: Stable bilateral lung opacities are noted concerning for multifocal pneumonia. Small left pleural effusion may be present. No pneumothorax is noted. Electronically Signed   By: Marijo Conception M.D.   On: 10/19/2020 16:09   DG Chest Port 1 View  Result Date: 10/19/2020 CLINICAL DATA:  70 year old male with shortness of breath. Acute kidney injury. Possible sepsis. EXAM: PORTABLE CHEST 1 VIEW  COMPARISON:  Portable chest Nov 14, 2020 and earlier. FINDINGS: Portable  AP upright view at 0912 hours. Stable cardiomegaly and mediastinal contours. Stable left chest AICD. Visualized tracheal air column is within normal limits. Ongoing extensive bilateral pulmonary opacity, with patchy right greater than left lung airspace disease but dense retrocardiac opacity on the left. No pneumothorax. Possible small left pleural effusion. Ventilation has minimally improved since 10/03/2020, and remains worse than on 10/11/2020. Negative visible bowel gas. Stable visualized osseous structures. Chronic right lateral rib fractures. IMPRESSION: Ongoing extensive bilateral pulmonary opacity, minimally improved since 10/07/2020. Top differential considerations are severe pulmonary edema, pneumonia, ARDS. Possible small left pleural effusion with associated left lower lobe collapse or consolidation. Electronically Signed   By: Genevie Ann M.D.   On: 10/19/2020 11:10   DG Chest Port 1 View  Result Date: 10/20/2020 CLINICAL DATA:  Questionable sepsis. EXAM: PORTABLE CHEST 1 VIEW COMPARISON:  10/11/2020 FINDINGS: There is a left chest wall ICD with lead in the right ventricle. Previous median sternotomy and CABG procedure. Stable cardiac enlargement. Bilateral pleural effusions noted, left greater than right. Extensive bilateral interstitial and airspace densities have increased from previous exam. IMPRESSION: 1. Worsening aeration to both lungs. 2. Bilateral pleural effusions, left greater than right. Electronically Signed   By: Kerby Moors M.D.   On: 10/02/2020 18:39   DG Chest Port 1 View  Result Date: 10/11/2020 CLINICAL DATA:  Evaluate PICC line insertion. EXAM: PORTABLE CHEST 1 VIEW COMPARISON:  10/10/2020 FINDINGS: Left chest wall pacer device is noted with lead in the right ventricle. The right arm PICC line tip is in the cavoatrial junction. Stable mild cardiac enlargement. Left pleural effusion is again seen. There is  diffuse interstitial and airspace densities within both lungs which appear unchanged. Remote right rib fractures are again identified. IMPRESSION: 1. Right arm PICC line tip is at the cavoatrial junction. 2. Stable left pleural effusion and diffuse interstitial and airspace densities within both lungs. Electronically Signed   By: Kerby Moors M.D.   On: 10/11/2020 15:08   DG Chest Port 1 View  Result Date: 10/10/2020 CLINICAL DATA:  Hypotension, edema EXAM: PORTABLE CHEST 1 VIEW COMPARISON:  10/08/2020 FINDINGS: Cardiomegaly, vascular congestion. Diffuse interstitial prominence throughout the lungs, favor edema. No real change since prior study. Left AICD remains in place, unchanged. Prior median sternotomy/CABG. No effusions or pneumothorax. Old right rib fractures again noted, unchanged. IMPRESSION: Cardiomegaly, vascular congestion and mild pulmonary edema. No real change. Electronically Signed   By: Rolm Baptise M.D.   On: 10/10/2020 12:19   DG Chest Port 1 View  Result Date: 10/08/2020 CLINICAL DATA:  Sepsis EXAM: PORTABLE CHEST 1 VIEW COMPARISON:  08/12/2020 FINDINGS: Cardiac shadow is enlarged but stable. Defibrillator and postsurgical changes are again noted. Old rib fractures are noted on the right with healing and callus formation. Previously seen vascular congestion has improved. No focal infiltrate is noted. IMPRESSION: Improved vascular congestion and edema when compare with the prior exam. No focal infiltrate is seen. Electronically Signed   By: Inez Catalina M.D.   On: 10/08/2020 13:13   ECHOCARDIOGRAM COMPLETE  Result Date: 10/19/2020    ECHOCARDIOGRAM REPORT   Patient Name:   Carl [name] Date of Exam: 10/19/2020 Medical Rec #:  [id number]     Height:       70.0 in Accession #:    [id number]    Weight:       126.8 lb Date of Birth:  24-Sep-1950    BSA:          1.720 m  Patient Age:    65 years      BP:           109/78 mmHg Patient Gender: M             HR:           108 bpm. Exam  Location:  [facility] Procedure: 2D Echo, Cardiac Doppler, Color Doppler and Strain Analysis Indications:     CHF I50.9  History:         Patient has prior history of Echocardiogram examinations, most                  recent 11/09/2019. Previous Myocardial Infarction;                  Defibrillator.  Sonographer:     Sherrie Sport RDCS (AE) Referring Phys:  [place] Diagnosing Phys: Ida Rogue MD  Sonographer Comments: Global longitudinal strain was attempted. IMPRESSIONS  1. Left ventricular ejection fraction, by estimation, is 25 to 30%. The left ventricle has severely decreased function. The left ventricle demonstrates global hypokinesis, lateral wall best preserved. The left ventricular internal cavity size was mildly  dilated. The global longitudinal strain is abnormal.  2. Right ventricular systolic function is normal. The right ventricular size is normal. There is moderately elevated pulmonary artery systolic pressure. The estimated right ventricular systolic pressure is 98.3 mmHg.  3. The mitral valve repair/clip in place. Moderate mitral valve regurgitation. No evidence of mitral stenosis.  4. Left atrial size was moderately dilated.  5. Tricuspid valve regurgitation is mild to moderate.  6. The inferior vena cava is dilated in size with <50% respiratory variability, suggesting right atrial pressure of 15 mmHg.  7. Left pleural effusion 8 cm FINDINGS  Left Ventricle: Left ventricular ejection fraction, by estimation, is 25 to 30%. The left ventricle has severely decreased function. The left ventricle demonstrates global hypokinesis. The global longitudinal strain is abnormal. The left ventricular internal cavity size was mildly dilated. There is no left ventricular hypertrophy. Left ventricular diastolic parameters are indeterminate. Right Ventricle: The right ventricular size is normal. No increase in right ventricular wall thickness. Right ventricular systolic function is normal. There is  moderately elevated pulmonary artery systolic pressure. The tricuspid regurgitant velocity is 2.71 m/s, and with an assumed right atrial pressure of 20 mmHg, the estimated right ventricular systolic pressure is 38.2 mmHg. Left Atrium: Left atrial size was moderately dilated. Right Atrium: Right atrial size was normal in size. Pericardium: There is no evidence of pericardial effusion. Mitral Valve: The mitral valve is normal in structure. Moderate mitral valve regurgitation. There is a Mitra-Clip present in the mitral position. No evidence of mitral valve stenosis. MV peak gradient, 10.9 mmHg. The mean mitral valve gradient is 6.0 mmHg. Tricuspid Valve: The tricuspid valve is normal in structure. Tricuspid valve regurgitation is mild to moderate. No evidence of tricuspid stenosis. Aortic Valve: The aortic valve is normal in structure. Aortic valve regurgitation is not visualized. No aortic stenosis is present. Aortic valve mean gradient measures 2.7 mmHg. Aortic valve peak gradient measures 5.1 mmHg. Aortic valve area, by VTI measures 1.59 cm. Pulmonic Valve: The pulmonic valve was normal in structure. Pulmonic valve regurgitation is trivial. No evidence of pulmonic stenosis. Aorta: The aortic root is normal in size and structure. Venous: The inferior vena cava is dilated in size with less than 50% respiratory variability, suggesting right atrial pressure of 15 mmHg. IAS/Shunts: No atrial level shunt detected by color  flow Doppler. Additional Comments: A pacer wire is visualized.  LEFT VENTRICLE PLAX 2D LVIDd:         5.67 cm      Diastology LVIDs:         4.86 cm      LV e' medial:    5.00 cm/s LV PW:         0.81 cm      LV E/e' medial:  14.7 LV IVS:        0.95 cm      LV e' lateral:   8.38 cm/s LVOT diam:     2.00 cm      LV E/e' lateral: 8.7 LV SV:         27 LV SV Index:   16 LVOT Area:     3.14 cm                              3D Volume EF: LV Volumes (MOD)            3D EF:        34 % LV vol d, MOD A2C:  205.0 ml LV EDV:       249 ml LV vol d, MOD A4C: 183.0 ml LV ESV:       165 ml LV vol s, MOD A2C: 133.0 ml LV SV:        84 ml LV vol s, MOD A4C: 109.0 ml LV SV MOD A2C:     72.0 ml LV SV MOD A4C:     183.0 ml LV SV MOD BP:      73.6 ml RIGHT VENTRICLE RV Basal diam:  5.24 cm RV S prime:     9.03 cm/s TAPSE (M-mode): 4.3 cm LEFT ATRIUM              Index       RIGHT ATRIUM           Index LA diam:        4.50 cm  2.62 cm/m  RA Area:     26.00 cm LA Vol (A2C):   125.0 ml 72.69 ml/m RA Volume:   90.30 ml  52.51 ml/m LA Vol (A4C):   94.3 ml  54.84 ml/m LA Biplane Vol: 115.0 ml 66.88 ml/m  AORTIC VALVE                   PULMONIC VALVE AV Area (Vmax):    1.47 cm    PV Vmax:        0.49 m/s AV Area (Vmean):   1.24 cm    PV Peak grad:   1.0 mmHg AV Area (VTI):     1.59 cm    RVOT Peak grad: 1 mmHg AV Vmax:           113.00 cm/s AV Vmean:          74.133 cm/s AV VTI:            0.171 m AV Peak Grad:      5.1 mmHg AV Mean Grad:      2.7 mmHg LVOT Vmax:         52.90 cm/s LVOT Vmean:        29.200 cm/s LVOT VTI:          0.086 m LVOT/AV VTI ratio: 0.51  AORTA Ao Root diam: 3.70 cm MITRAL VALVE  TRICUSPID VALVE MV Area (PHT): 4.26 cm    TR Peak grad:   29.4 mmHg MV Area VTI:   0.69 cm    TR Vmax:        271.00 cm/s MV Peak grad:  10.9 mmHg MV Mean grad:  6.0 mmHg    SHUNTS MV Vmax:       1.65 m/s    Systemic VTI:  0.09 m MV Vmean:      113.0 cm/s  Systemic Diam: 2.00 cm MV Decel Time: 178 msec MV E velocity: 73.30 cm/s MV A velocity: 92.10 cm/s MV E/A ratio:  0.80 Ida Rogue MD Electronically signed by Ida Rogue MD Signature Date/Time: 10/19/2020/4:52:46 PM    Final    US THORACENTESIS ASP PLEURAL SPACE W/IMG GUIDE  Result Date: 10/19/2020 INDICATION: Heart failure, cirrhosis, acute renal insufficiency. Shortness of breath. Respiratory distress. Left greater than right bilateral pleural effusion. Request for diagnostic and therapeutic thoracentesis. EXAM: ULTRASOUND GUIDED LEFT THORACENTESIS  MEDICATIONS: 1% PLAIN LIDOCAINE, 5 ML COMPLICATIONS: None immediate. PROCEDURE: An ultrasound guided thoracentesis was thoroughly discussed with the patient and questions answered. The benefits, risks, alternatives and complications were also discussed. The patient understands and wishes to proceed with the procedure. Written consent was obtained. Ultrasound was performed to localize and mark an adequate pocket of fluid in the left chest. The area was then prepped and draped in the normal sterile fashion. 1% Lidocaine was used for local anesthesia. Under ultrasound guidance a 6 Fr Safe-T-Centesis catheter was introduced. Thoracentesis was performed. The catheter was removed and a dressing applied. FINDINGS: A total of approximately 1 L of clear yellow fluid was removed. Samples were sent to the laboratory as requested by the clinical team. IMPRESSION: Successful ultrasound guided left thoracentesis yielding 1 L of pleural fluid. Read by: Ascencion Dike PA-C Electronically Signed   By: Ruthann Cancer MD   On: 10/19/2020 15:52    Microbiology: Recent Results (from the past 240 hour(s))  Resp Panel by RT-PCR (Flu A&B, Covid) Nasopharyngeal Swab     Status: None   Collection Time: 10/16/20 11:22 AM   Specimen: Nasopharyngeal Swab; Nasopharyngeal(NP) swabs in vial transport medium  Result Value Ref Range Status   SARS Coronavirus 2 by RT PCR NEGATIVE NEGATIVE Final    Comment: (NOTE) SARS-CoV-2 target nucleic acids are NOT DETECTED.  The SARS-CoV-2 RNA is generally detectable in upper respiratory specimens during the acute phase of infection. The lowest concentration of SARS-CoV-2 viral copies this assay can detect is 138 copies/mL. A negative result does not preclude SARS-Cov-2 infection and should not be used as the sole basis for treatment or other patient management decisions. A negative result may occur with  improper specimen collection/handling, submission of specimen other than nasopharyngeal  swab, presence of viral mutation(s) within the areas targeted by this assay, and inadequate number of viral copies(<138 copies/mL). A negative result must be combined with clinical observations, patient history, and epidemiological information. The expected result is Negative.  Fact Sheet for Patients:  EntrepreneurPulse.com.au  Fact Sheet for Healthcare Providers:  IncredibleEmployment.be  This test is no t yet approved or cleared by the [place] FDA and  has been authorized for detection and/or diagnosis of SARS-CoV-2 by FDA under an Emergency Use Authorization (EUA). This EUA will remain  in effect (meaning this test can be used) for the duration of the COVID-19 declaration under Section 564(b)(1) of the Act, 21 U.S.C.section 360bbb-3(b)(1), unless the authorization is terminated  or revoked sooner.  Influenza A by PCR NEGATIVE NEGATIVE Final   Influenza B by PCR NEGATIVE NEGATIVE Final    Comment: (NOTE) The Xpert Xpress SARS-CoV-2/FLU/RSV plus assay is intended as an aid in the diagnosis of influenza from Nasopharyngeal swab specimens and should not be used as a sole basis for treatment. Nasal washings and aspirates are unacceptable for Xpert Xpress SARS-CoV-2/FLU/RSV testing.  Fact Sheet for Patients: EntrepreneurPulse.com.au  Fact Sheet for Healthcare Providers: IncredibleEmployment.be  This test is not yet approved or cleared by the [place] FDA and has been authorized for detection and/or diagnosis of SARS-CoV-2 by FDA under an Emergency Use Authorization (EUA). This EUA will remain in effect (meaning this test can be used) for the duration of the COVID-19 declaration under Section 564(b)(1) of the Act, 21 U.S.C. section 360bbb-3(b)(1), unless the authorization is terminated or revoked.  Performed at [facility], [address] [place], [place] [address]   Blood  Culture (routine x 2)     Status: None (Preliminary result)   Collection Time: 10/20/2020  5:23 PM   Specimen: BLOOD  Result Value Ref Range Status   Specimen Description BLOOD BLOOD LEFT FOREARM  Final   Special Requests   Final    BOTTLES DRAWN AEROBIC AND ANAEROBIC Blood Culture adequate volume   Culture   Final    NO GROWTH 4 DAYS Performed at [facility], [address], [place], [place] [address]    Report Status PENDING  Incomplete  Blood Culture (routine x 2)     Status: None (Preliminary result)   Collection Time: 10/16/2020  5:23 PM   Specimen: BLOOD  Result Value Ref Range Status   Specimen Description BLOOD LEFT ANTECUBITAL  Final   Special Requests   Final    BOTTLES DRAWN AEROBIC AND ANAEROBIC Blood Culture adequate volume   Culture   Final    NO GROWTH 4 DAYS Performed at [facility], [address] [place], [place] [address]    Report Status PENDING  Incomplete  Resp Panel by RT-PCR (Flu A&B, Covid) Nasopharyngeal Swab     Status: None   Collection Time: 10/22/2020  5:39 PM   Specimen: Nasopharyngeal Swab; Nasopharyngeal(NP) swabs in vial transport medium  Result Value Ref Range Status   SARS Coronavirus 2 by RT PCR NEGATIVE NEGATIVE Final    Comment: (NOTE) SARS-CoV-2 target nucleic acids are NOT DETECTED.  The SARS-CoV-2 RNA is generally detectable in upper respiratory specimens during the acute phase of infection. The lowest concentration of SARS-CoV-2 viral copies this assay can detect is 138 copies/mL. A negative result does not preclude SARS-Cov-2 infection and should not be used as the sole basis for treatment or other patient management decisions. A negative result may occur with  improper specimen collection/handling, submission of specimen other than nasopharyngeal swab, presence of viral mutation(s) within the areas targeted by this assay, and inadequate number of viral copies(<138 copies/mL). A negative result must be combined  with clinical observations, patient history, and epidemiological information. The expected result is Negative.  Fact Sheet for Patients:  EntrepreneurPulse.com.au  Fact Sheet for Healthcare Providers:  IncredibleEmployment.be  This test is no t yet approved or cleared by the [place] FDA and  has been authorized for detection and/or diagnosis of SARS-CoV-2 by FDA under an Emergency Use Authorization (EUA). This EUA will remain  in effect (meaning this test can be used) for the duration of the COVID-19 declaration under Section 564(b)(1) of the Act, 21 U.S.C.section 360bbb-3(b)(1), unless the  authorization is terminated  or revoked sooner.       Influenza A by PCR NEGATIVE NEGATIVE Final   Influenza B by PCR NEGATIVE NEGATIVE Final    Comment: (NOTE) The Xpert Xpress SARS-CoV-2/FLU/RSV plus assay is intended as an aid in the diagnosis of influenza from Nasopharyngeal swab specimens and should not be used as a sole basis for treatment. Nasal washings and aspirates are unacceptable for Xpert Xpress SARS-CoV-2/FLU/RSV testing.  Fact Sheet for Patients: EntrepreneurPulse.com.au  Fact Sheet for Healthcare Providers: IncredibleEmployment.be  This test is not yet approved or cleared by the [place] FDA and has been authorized for detection and/or diagnosis of SARS-CoV-2 by FDA under an Emergency Use Authorization (EUA). This EUA will remain in effect (meaning this test can be used) for the duration of the COVID-19 declaration under Section 564(b)(1) of the Act, 21 U.S.C. section 360bbb-3(b)(1), unless the authorization is terminated or revoked.  Performed at [facility], [address] [place], [place] [address]   Urine culture     Status: None   Collection Time: 10/03/2020 10:41 PM   Specimen: In/Out Cath Urine  Result Value Ref Range Status   Specimen Description   Final    IN/OUT  CATH URINE Performed at [facility], [address], [place], [place] [address]    Special Requests   Final    NONE Performed at [facility], [address], [place], [place] [address]    Culture   Final    NO GROWTH Performed at [facility], [place] [address] [place], [place] [address]    Report Status 10/19/2020 FINAL  Final  MRSA PCR Screening     Status: None   Collection Time: 10/18/20  8:41 AM   Specimen: Nasopharyngeal  Result Value Ref Range Status   MRSA by PCR NEGATIVE NEGATIVE Final    Comment:        The GeneXpert MRSA Assay (FDA approved for NASAL specimens only), is one component of a comprehensive MRSA colonization surveillance program. It is not intended to diagnose MRSA infection nor to guide or monitor treatment for MRSA infections. Performed at [facility], [address] [place], [place] [address]   Body fluid culture w Gram Stain     Status: None (Preliminary result)   Collection Time: 10/19/20  3:42 PM   Specimen: PATH Cytology Pleural fluid  Result Value Ref Range Status   Specimen Description   Final    PLEURAL Performed at [facility], [address] [place], [place] [address]    Special Requests   Final    NONE Performed at [facility], [address] [place], [place] [address]    Gram Stain NO WBC SEEN NO ORGANISMS SEEN   Final   Culture   Final    NO GROWTH 2 DAYS Performed at [facility], [place] [address], [place], [place] [address]    Report Status PENDING  Incomplete     Labs: Basic Metabolic Panel: Recent Labs  Lab 10/15/20 0520 10/16/20 0538 10/08/2020 1722 09/27/2020 1722 10/18/20 0546 10/19/20 0418 10/20/20 0515  NA  --   --  132*  --  133* 136 139  K  --   --  4.3   < > 3.9 4.0 4.5  CL  --   --  98  --  100 102 103  CO2  --   --  24  --  25 24 21*  GLUCOSE  --   --  107*  --  96 93 101*  BUN  --   --  28*  --  30* 32* 41*  CREATININE  --   --  1.95*   --  1.98* 1.67* 2.30*  CALCIUM  --   --  9.0  --  8.7* 8.8* 8.9  MG 2.0 2.1  --   --   --   --   --   PHOS 3.6 3.4  --   --   --   --   --    < > = values in this interval not displayed.   Liver Function Tests: Recent Labs  Lab 10/08/2020 1722 10/18/20 0546  AST 36 35  ALT 30 28  ALKPHOS 133* 133*  BILITOT 1.6* 1.9*  PROT 7.1 6.8  ALBUMIN 3.1* 3.0*   No results for input(s): LIPASE, AMYLASE in the last 168 hours. Recent Labs  Lab 10/20/20 0515  AMMONIA 31   CBC: Recent Labs  Lab 09/29/2020 1722 10/18/20 0546 10/19/20 0418 10/20/20 0515  WBC 11.5* 11.4* 12.2* 19.2*  NEUTROABS 9.8* 9.7*  --   --   HGB 8.7* 9.5* 9.1* 9.4*  HCT 27.4* 28.9* 29.8* 31.1*  MCV 96.5 96.3 98.0 99.7  PLT 345 348 368 400   Cardiac Enzymes: No results for input(s): CKTOTAL, CKMB, CKMBINDEX, TROPONINI in the last 168 hours. D-Dimer No results for input(s): DDIMER in the last 72 hours. BNP: Invalid input(s): POCBNP CBG: Recent Labs  Lab 10/14/2020 2100 10/18/20 0301 11/02/20 0758 Nov 02, 2020 0802  GLUCAP 92 96 <10* <10*   Anemia work up No results for input(s): VITAMINB12, FOLATE, FERRITIN, TIBC, IRON, RETICCTPCT in the last 72 hours. Urinalysis    Component Value Date/Time   COLORURINE AMBER (A) 09/25/2020 2241   APPEARANCEUR HAZY (A) 10/20/2020 2241   APPEARANCEUR Clear 04/28/2017 1259   LABSPEC 1.021 10/15/2020 2241   PHURINE 5.0 10/08/2020 2241   GLUCOSEU NEGATIVE 10/05/2020 2241   HGBUR NEGATIVE 10/19/2020 2241   BILIRUBINUR NEGATIVE 09/26/2020 2241   BILIRUBINUR Negative 04/28/2017 1259   KETONESUR NEGATIVE 09/26/2020 2241   PROTEINUR NEGATIVE 10/07/2020 2241   NITRITE NEGATIVE 10/09/2020 2241   LEUKOCYTESUR NEGATIVE 10/05/2020 2241   Sepsis Labs Invalid input(s): PROCALCITONIN,  WBC,  LACTICIDVEN  SIGNED:  Lorella Nimrod, MD  [facility] 11/02/20, 1:45 PM Pager   If 7PM-7AM, please contact night-coverage [email or web address] Password [email or web address]  This record has been  created using Systems analyst. Errors have been sought and corrected,but may not always be located. Such creation errors do not reflect on the standard of care.

## 2020-10-23 NOTE — Progress Notes (Signed)
PROGRESS NOTE    Carl [name]  OVF:[id number] DOB: [birth year] DOA: 10/20/2020 PCP: [name], MD   Brief Narrative: Taken from H&P. Carl [name] is a 70 y.o. male with medical history significant for previous GI bleed, acute kidney injury, recent admission for shock with chronic GI bleed, heart failure reduced ejection fraction, paroxysmal atrial fibrillation, history of hypertension, depression, liver cirrhosis, coronary artery disease, GERD, hearing loss, Schatzki's ring, tubular adenoma of the colon, peripheral vascular disease, osteoporosis, presents to the emergency department from [facility] for chief concerns of hypoxia on room air. On arrival patient was minimally responsive to sternal rub.  Appears very lethargic.  He was febrile at 101.9, procalcitonin at 0.54, elevated BNP at 2556, hypotensive, chest x-ray with bilateral pleural effusions and no infiltrate, blood and urine cultures negative. Admitted with concern of sepsis and failure to thrive. Sepsis ruled out at this point as there is no source of infection. Patient remained hypoxic and in distress despite doing thoracentesis and removal of 1 L of transudative fluid. Intermittent V. tach which resulted in firing of his ICD. Had another long family meeting with multiple family members and he was transition to full comfort measures.  Subjective: Patient was unresponsive, having apneic spells.  No response to deep sternal rub or painful stimuli. Morphine infusion was discontinued earlier in the day at family's request, they were concerned that it is paralyzing him.  Assessment & Plan:   Principal Problem:   Sepsis associated hypotension (HCC) Active Problems:   Protein-calorie malnutrition, severe (HCC)   AKI (acute kidney injury) ([place])   S/P CABG x 5   Hyperlipidemia   Essential hypertension   CAD (coronary artery disease)   GERD (gastroesophageal reflux disease)   Depression   Symptomatic anemia   Chronic  systolic CHF (congestive heart failure) (HCC)   Cirrhosis (HCC)   Hyponatremia   Acute kidney injury superimposed on CKD (HCC)   Leukocytosis   HFrEF (heart failure with reduced ejection fraction) (HCC)   Paroxysmal atrial fibrillation (HCC)   Failure to thrive in adult  Sepsis/SIRS.  Patient met SIRS criteria initially with fever, leukocytosis, tachypnea, AKI and encephalopathy.  Procalcitonin at 0.54.  Chest x-ray is not very impressive for pneumonia.  Blood and urine cultures negative at this time. Sepsis ruled out. He was started on broad-spectrum antibiotics with cefepime and vancomycin.  Did not received much fluid with HFrEF and concern of pulmonary edema.   He was also started on midodrine to keep the MAP greater than 65. No obvious source of infection at this time.  MRSA swab negative, COVID-19 negative. Sepsis ruled out at this point. Vancomycin was discontinued earlier and cefepime was switched to ceftriaxone. Thoracentesis was done yesterday with removal of 1 L of transudative fluid, cultures negative with no change in his respiratory status. Becoming more tachycardic, tachypneic and hypoxic despite being on BiPAP.  Appears in distress.  Worsening renal function.  Patient is in multiorgan failure. Had another discussion with multiple family members which include his daughter, brother and sister and he was transitioned to full comfort measures. -Anticipating hospital death, seems like actively dying.  Acute hypoxic respiratory failure.  Currently requiring BiPAP.  Most likely secondary to acute on chronic HFrEF. -Repeat chest x-ray with worsening aeration and bilateral pleural effusions, left more than right.  We will try thoracentesis with no help Patient is now full comfort measures only, started on morphine to prevent air hunger. Currently no morphine at family's request, if he becomes  uncomfortable they are okay with restarting morphine.  HFrEF.  Patient has an history of severe  ischemic cardiomyopathy and coronary artery disease s/p CABG and severe mitral valve regurgitation s/p mitral valve clip.  EF of 20 to 25%.  Not a candidate for any further aggressive measures.  His home dose of carvedilol, metolazone and torsemide were discontinued during prior hospitalization due to softer blood pressure. Patient appears in pulmonary edema.  BNP at [facility] was consulted-we appreciate their help, according to their note very poor prognosis. Repeat echocardiogram done-similar EF, global hypokinesis and longitudinal strain which is abnormal. Unable to tolerate Lasix due to worsening renal function and poor urinary output.  Failure to thrive.  Patient was recently discharged to SNF and at that time his heart failure medications were discontinued due to softer blood pressure.  Outpatient palliative care follow-up was recommended. -Palliative care consult-patient is appropriate for comfort measures/hospice care. -Patient is very high risk for deterioration and death.  AKI.  Most likely secondary to cardiorenal. Worsening renal function-now transition to full comfort measures only.  Liver cirrhosis.  Meld sodium score of 23. Not a candidate for transplant. -Continue with supportive care  Paroxysmal atrial fibrillation.  Currently in sinus rhythm, with intermittent V. Tach. -Not a candidate for anticoagulation due to recent GI bleed. -Position to full comfort measures only  Chronic anemia with recent GI bleed.  Anticoagulation was discontinued.  Hemoglobin stable at 9.1.  History of depression. -Discontinue home dose of Cymbalta as he was transitioned to full comfort measures only.  History of gout.  No acute concern.  GERD. -Continue Protonix  Objective: Vitals:   10/20/20 1750 10/20/20 1953 November 06, 2020 0012 2020/11/06 1108  BP: (!) 81/47 (!) 87/54 (!) 73/41 (!) 63/32  Pulse: 81 81 79 79  Resp:  14 17 (!) 6  Temp:  98.5 F (36.9 C) 100.2 F (37.9 C) (!) 102 F  (38.9 C)  TempSrc:  Oral Oral   SpO2: (!) 73% (!) 72% (!) 72% (!) 89%  Weight:      Height:        Intake/Output Summary (Last 24 hours) at 2020/11/06 1127 Last data filed at Nov 06, 2020 0630 Gross per 24 hour  Intake 26.34 ml  Output -  Net 26.34 ml   Filed Weights   09/30/2020 1720 09/26/2020 2115  Weight: 77.9 kg 57.5 kg    Examination:  General.  Unresponsive gentleman, no response to deep sternal rub or painful stimuli, having apneic spells. Pulmonary.  Lungs clear bilaterally, normal respiratory effort. CV.  Regular rate and rhythm,  Abdomen.  Soft, BS positive. Extremities.  No edema, no cyanosis, pulses intact and symmetrical. Psychiatry.  Judgment and insight appears impaired  DVT prophylaxis: SCDs Code Status: DNR Family Communication: Discussed with daughter and sister at bedside Disposition Plan:  Status is: Inpatient  Remains inpatient appropriate because:Inpatient level of care appropriate due to severity of illness   Dispo: The patient is from: SNF              Anticipated d/c is to: Anticipating hospital death.              Patient currently is not medically stable to d/c.   Difficult to place patient               Level of care: Palliative Care  All the records are reviewed and case discussed with Care Management/Social Worker. Management plans discussed with the patient, nursing and they are in agreement.  Consultants:  Palliative care  Cardiology  Procedures:  Antimicrobials:   Data Reviewed: I have personally reviewed following labs and imaging studies  CBC: Recent Labs  Lab 09/27/2020 1722 10/18/20 0546 10/19/20 0418 10/20/20 0515  WBC 11.5* 11.4* 12.2* 19.2*  NEUTROABS 9.8* 9.7*  --   --   HGB 8.7* 9.5* 9.1* 9.4*  HCT 27.4* 28.9* 29.8* 31.1*  MCV 96.5 96.3 98.0 99.7  PLT 345 348 368 314   Basic Metabolic Panel: Recent Labs  Lab 10/15/20 0520 10/16/20 0538 10/01/2020 1722 10/18/20 0546 10/19/20 0418 10/20/20 0515  NA  --   --   132* 133* 136 139  K  --   --  4.3 3.9 4.0 4.5  CL  --   --  98 100 102 103  CO2  --   --  '24 25 24 ' 21*  GLUCOSE  --   --  107* 96 93 101*  BUN  --   --  28* 30* 32* 41*  CREATININE  --   --  1.95* 1.98* 1.67* 2.30*  CALCIUM  --   --  9.0 8.7* 8.8* 8.9  MG 2.0 2.1  --   --   --   --   PHOS 3.6 3.4  --   --   --   --    GFR: Estimated Creatinine Clearance: 24.7 mL/min (A) (by C-G formula based on SCr of 2.3 mg/dL (H)). Liver Function Tests: Recent Labs  Lab 10/22/2020 1722 10/18/20 0546  AST 36 35  ALT 30 28  ALKPHOS 133* 133*  BILITOT 1.6* 1.9*  PROT 7.1 6.8  ALBUMIN 3.1* 3.0*   No results for input(s): LIPASE, AMYLASE in the last 168 hours. Recent Labs  Lab 10/20/20 0515  AMMONIA 31   Coagulation Profile: Recent Labs  Lab 10/06/2020 1722 10/18/20 0546  INR 1.4* 1.4*   Cardiac Enzymes: No results for input(s): CKTOTAL, CKMB, CKMBINDEX, TROPONINI in the last 168 hours. BNP (last 3 results) No results for input(s): PROBNP in the last 8760 hours. HbA1C: No results for input(s): HGBA1C in the last 72 hours. CBG: Recent Labs  Lab 10/15/2020 2100 10/18/20 0301 Oct 29, 2020 0758 10-29-20 0802  GLUCAP 92 96 <10* <10*   Lipid Profile: No results for input(s): CHOL, HDL, LDLCALC, TRIG, CHOLHDL, LDLDIRECT in the last 72 hours. Thyroid Function Tests: No results for input(s): TSH, T4TOTAL, FREET4, T3FREE, THYROIDAB in the last 72 hours. Anemia Panel: No results for input(s): VITAMINB12, FOLATE, FERRITIN, TIBC, IRON, RETICCTPCT in the last 72 hours. Sepsis Labs: Recent Labs  Lab 10/01/2020 1723 09/30/2020 2107 10/18/20 0546  PROCALCITON  --   --  0.54  LATICACIDVEN 1.6 1.5  --     Recent Results (from the past 240 hour(s))  Resp Panel by RT-PCR (Flu A&B, Covid) Nasopharyngeal Swab     Status: None   Collection Time: 10/16/20 11:22 AM   Specimen: Nasopharyngeal Swab; Nasopharyngeal(NP) swabs in vial transport medium  Result Value Ref Range Status   SARS Coronavirus 2 by  RT PCR NEGATIVE NEGATIVE Final    Comment: (NOTE) SARS-CoV-2 target nucleic acids are NOT DETECTED.  The SARS-CoV-2 RNA is generally detectable in upper respiratory specimens during the acute phase of infection. The lowest concentration of SARS-CoV-2 viral copies this assay can detect is 138 copies/mL. A negative result does not preclude SARS-Cov-2 infection and should not be used as the sole basis for treatment or other patient management decisions. A negative result may occur with  improper specimen collection/handling, submission of  specimen other than nasopharyngeal swab, presence of viral mutation(s) within the areas targeted by this assay, and inadequate number of viral copies(<138 copies/mL). A negative result must be combined with clinical observations, patient history, and epidemiological information. The expected result is Negative.  Fact Sheet for Patients:  EntrepreneurPulse.com.au  Fact Sheet for Healthcare Providers:  IncredibleEmployment.be  This test is no t yet approved or cleared by the [place] FDA and  has been authorized for detection and/or diagnosis of SARS-CoV-2 by FDA under an Emergency Use Authorization (EUA). This EUA will remain  in effect (meaning this test can be used) for the duration of the COVID-19 declaration under Section 564(b)(1) of the Act, 21 U.S.C.section 360bbb-3(b)(1), unless the authorization is terminated  or revoked sooner.       Influenza A by PCR NEGATIVE NEGATIVE Final   Influenza B by PCR NEGATIVE NEGATIVE Final    Comment: (NOTE) The Xpert Xpress SARS-CoV-2/FLU/RSV plus assay is intended as an aid in the diagnosis of influenza from Nasopharyngeal swab specimens and should not be used as a sole basis for treatment. Nasal washings and aspirates are unacceptable for Xpert Xpress SARS-CoV-2/FLU/RSV testing.  Fact Sheet for Patients: EntrepreneurPulse.com.au  Fact Sheet  for Healthcare Providers: IncredibleEmployment.be  This test is not yet approved or cleared by the [place] FDA and has been authorized for detection and/or diagnosis of SARS-CoV-2 by FDA under an Emergency Use Authorization (EUA). This EUA will remain in effect (meaning this test can be used) for the duration of the COVID-19 declaration under Section 564(b)(1) of the Act, 21 U.S.C. section 360bbb-3(b)(1), unless the authorization is terminated or revoked.  Performed at [facility], [address] [place], [place] [address]   Blood Culture (routine x 2)     Status: None (Preliminary result)   Collection Time: 10/01/2020  5:23 PM   Specimen: BLOOD  Result Value Ref Range Status   Specimen Description BLOOD BLOOD LEFT FOREARM  Final   Special Requests   Final    BOTTLES DRAWN AEROBIC AND ANAEROBIC Blood Culture adequate volume   Culture   Final    NO GROWTH 4 DAYS Performed at [facility], [address], [place], [place] [address]    Report Status PENDING  Incomplete  Blood Culture (routine x 2)     Status: None (Preliminary result)   Collection Time: 10/16/2020  5:23 PM   Specimen: BLOOD  Result Value Ref Range Status   Specimen Description BLOOD LEFT ANTECUBITAL  Final   Special Requests   Final    BOTTLES DRAWN AEROBIC AND ANAEROBIC Blood Culture adequate volume   Culture   Final    NO GROWTH 4 DAYS Performed at [facility], [address] [place], [place] [address]    Report Status PENDING  Incomplete  Resp Panel by RT-PCR (Flu A&B, Covid) Nasopharyngeal Swab     Status: None   Collection Time: 10/14/2020  5:39 PM   Specimen: Nasopharyngeal Swab; Nasopharyngeal(NP) swabs in vial transport medium  Result Value Ref Range Status   SARS Coronavirus 2 by RT PCR NEGATIVE NEGATIVE Final    Comment: (NOTE) SARS-CoV-2 target nucleic acids are NOT DETECTED.  The SARS-CoV-2 RNA is generally detectable in upper  respiratory specimens during the acute phase of infection. The lowest concentration of SARS-CoV-2 viral copies this assay can detect is 138 copies/mL. A negative result does not preclude SARS-Cov-2 infection and should not be used as the sole basis for treatment or other patient management  decisions. A negative result may occur with  improper specimen collection/handling, submission of specimen other than nasopharyngeal swab, presence of viral mutation(s) within the areas targeted by this assay, and inadequate number of viral copies(<138 copies/mL). A negative result must be combined with clinical observations, patient history, and epidemiological information. The expected result is Negative.  Fact Sheet for Patients:  EntrepreneurPulse.com.au  Fact Sheet for Healthcare Providers:  IncredibleEmployment.be  This test is no t yet approved or cleared by the [place] FDA and  has been authorized for detection and/or diagnosis of SARS-CoV-2 by FDA under an Emergency Use Authorization (EUA). This EUA will remain  in effect (meaning this test can be used) for the duration of the COVID-19 declaration under Section 564(b)(1) of the Act, 21 U.S.C.section 360bbb-3(b)(1), unless the authorization is terminated  or revoked sooner.       Influenza A by PCR NEGATIVE NEGATIVE Final   Influenza B by PCR NEGATIVE NEGATIVE Final    Comment: (NOTE) The Xpert Xpress SARS-CoV-2/FLU/RSV plus assay is intended as an aid in the diagnosis of influenza from Nasopharyngeal swab specimens and should not be used as a sole basis for treatment. Nasal washings and aspirates are unacceptable for Xpert Xpress SARS-CoV-2/FLU/RSV testing.  Fact Sheet for Patients: EntrepreneurPulse.com.au  Fact Sheet for Healthcare Providers: IncredibleEmployment.be  This test is not yet approved or cleared by the [place] FDA and has been  authorized for detection and/or diagnosis of SARS-CoV-2 by FDA under an Emergency Use Authorization (EUA). This EUA will remain in effect (meaning this test can be used) for the duration of the COVID-19 declaration under Section 564(b)(1) of the Act, 21 U.S.C. section 360bbb-3(b)(1), unless the authorization is terminated or revoked.  Performed at [facility], [address] [place], [place] [address]   Urine culture     Status: None   Collection Time: 10/14/2020 10:41 PM   Specimen: In/Out Cath Urine  Result Value Ref Range Status   Specimen Description   Final    IN/OUT CATH URINE Performed at [facility], [address] [place], [place] [address]    Special Requests   Final    NONE Performed at [facility], [address], [place], [place] [address]    Culture   Final    NO GROWTH Performed at [facility], [place] [address] [place], [place] [address]    Report Status 10/19/2020 FINAL  Final  MRSA PCR Screening     Status: None   Collection Time: 10/18/20  8:41 AM   Specimen: Nasopharyngeal  Result Value Ref Range Status   MRSA by PCR NEGATIVE NEGATIVE Final    Comment:        The GeneXpert MRSA Assay (FDA approved for NASAL specimens only), is one component of a comprehensive MRSA colonization surveillance program. It is not intended to diagnose MRSA infection nor to guide or monitor treatment for MRSA infections. Performed at [facility], [address] [place], [place] [address]   Body fluid culture w Gram Stain     Status: None (Preliminary result)   Collection Time: 10/19/20  3:42 PM   Specimen: PATH Cytology Pleural fluid  Result Value Ref Range Status   Specimen Description   Final    PLEURAL Performed at [facility], [address], [place], [place] [address]    Special Requests   Final    NONE Performed at [facility], [address] [place], [place] [address]  Gram Stain NO  WBC SEEN NO ORGANISMS SEEN   Final   Culture   Final    NO GROWTH 2 DAYS Performed at [facility], [place] [address], [place], [place] [address]    Report Status PENDING  Incomplete     Radiology Studies: DG Chest Port 1 View  Result Date: 10/19/2020 CLINICAL DATA:  Status post left thoracentesis. EXAM: PORTABLE CHEST 1 VIEW COMPARISON:  October 19, 2020. FINDINGS: Stable cardiomediastinal silhouette. Status post coronary bypass graft. Left-sided pacemaker is unchanged in position. No pneumothorax is noted. Stable bilateral lung opacities are noted concerning for multifocal pneumonia. Small left pleural effusion may be present. Bony thorax is unremarkable. IMPRESSION: Stable bilateral lung opacities are noted concerning for multifocal pneumonia. Small left pleural effusion may be present. No pneumothorax is noted. Electronically Signed   By: Marijo Conception M.D.   On: 10/19/2020 16:09   US THORACENTESIS ASP PLEURAL SPACE W/IMG GUIDE  Result Date: 10/19/2020 INDICATION: Heart failure, cirrhosis, acute renal insufficiency. Shortness of breath. Respiratory distress. Left greater than right bilateral pleural effusion. Request for diagnostic and therapeutic thoracentesis. EXAM: ULTRASOUND GUIDED LEFT THORACENTESIS MEDICATIONS: 1% PLAIN LIDOCAINE, 5 ML COMPLICATIONS: None immediate. PROCEDURE: An ultrasound guided thoracentesis was thoroughly discussed with the patient and questions answered. The benefits, risks, alternatives and complications were also discussed. The patient understands and wishes to proceed with the procedure. Written consent was obtained. Ultrasound was performed to localize and mark an adequate pocket of fluid in the left chest. The area was then prepped and draped in the normal sterile fashion. 1% Lidocaine was used for local anesthesia. Under ultrasound guidance a 6 Fr Safe-T-Centesis catheter was introduced. Thoracentesis was performed. The catheter was removed and a dressing  applied. FINDINGS: A total of approximately 1 L of clear yellow fluid was removed. Samples were sent to the laboratory as requested by the clinical team. IMPRESSION: Successful ultrasound guided left thoracentesis yielding 1 L of pleural fluid. Read by: Ascencion Dike PA-C Electronically Signed   By: Ruthann Cancer MD   On: 10/19/2020 15:52    Scheduled Meds: . dextrose      . chlorhexidine  15 mL Mouth Rinse BID  . Chlorhexidine Gluconate Cloth  6 each Topical Daily  . mouth rinse  15 mL Mouth Rinse q12n4p  . sucralfate  1 g Oral BID   Continuous Infusions: . morphine Stopped (04-Nov-2020 0829)     LOS: 4 days   Time spent: 20 minutes. More than 50% of the time was spent in counseling/coordination of care  Lorella Nimrod, MD [facility]  If 7PM-7AM, please contact night-coverage [email or web address]  11-04-20, 11:27 AM   This record has been created using Systems analyst. Errors have been sought and corrected,but may not always be located. Such creation errors do not reflect on the standard of care.

## 2020-10-23 DEATH — deceased

## 2020-10-26 ENCOUNTER — Other Ambulatory Visit: Payer: Medicare Other

## 2020-10-26 ENCOUNTER — Ambulatory Visit: Payer: Medicare Other

## 2020-10-26 LAB — GLUCOSE, CAPILLARY: Glucose-Capillary: <10 mg/dL — CL (ref 70–99)

## 2020-11-01 ENCOUNTER — Ambulatory Visit: Payer: Medicare Other | Admitting: Podiatry

## 2020-11-08 ENCOUNTER — Ambulatory Visit: Admit: 2020-11-08 | Payer: Medicare Other | Admitting: Ophthalmology

## 2020-11-08 SURGERY — PHACOEMULSIFICATION, CATARACT, WITH IOL INSERTION
Anesthesia: Topical | Laterality: Left

## 2020-11-13 ENCOUNTER — Other Ambulatory Visit: Payer: Medicare Other

## 2020-11-14 ENCOUNTER — Ambulatory Visit: Payer: Medicare Other | Admitting: Oncology

## 2020-11-14 ENCOUNTER — Ambulatory Visit: Payer: Medicare Other

## 2020-12-19 ENCOUNTER — Other Ambulatory Visit: Payer: Medicare Other

## 2020-12-21 ENCOUNTER — Ambulatory Visit: Payer: Medicare Other

## 2020-12-21 ENCOUNTER — Ambulatory Visit: Payer: Medicare Other | Admitting: Oncology

## 2021-03-22 IMAGING — CT CT HEAD WITHOUT CONTRAST
4 series · 17 of 47 positions shown, 19 images · non-contrast
Comparison: Head CT 10/16/2018

CLINICAL DATA: Altered mental status.

EXAM:
CT HEAD WITHOUT CONTRAST
TECHNIQUE: Contiguous axial images were obtained from the base of the skull
through the vertex without intravenous contrast.

[Series 3: head without · axial · non-contrast · 0.41mm/px · z∈[+1458,+1578]mm · 7 of 33 slices shown, 9 images]
[im 5/33  brain]
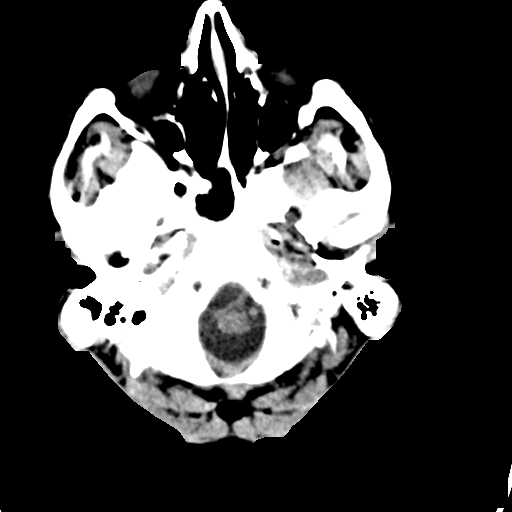
[im 5/33  bone]
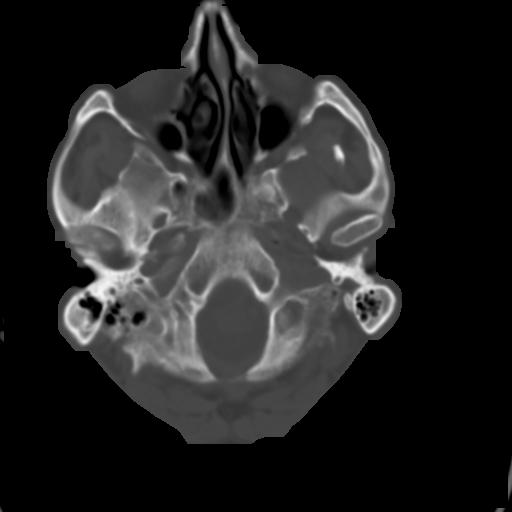
[im 9/33  brain]
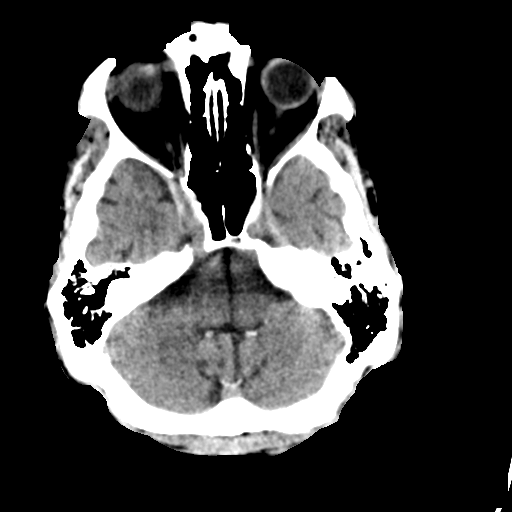
[im 13/33  brain]
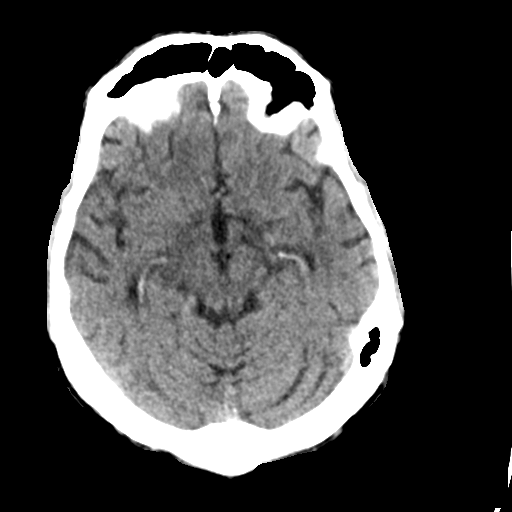
[im 17/33  brain]
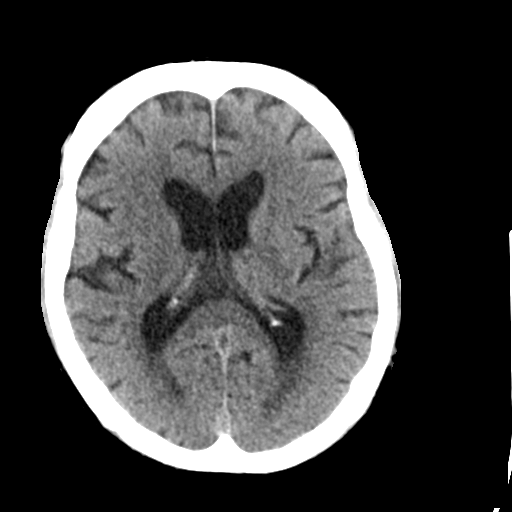
[im 21/33  brain]
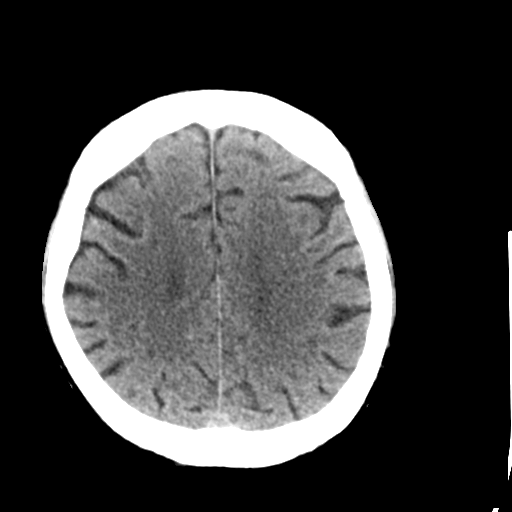
[im 21/33  bone]
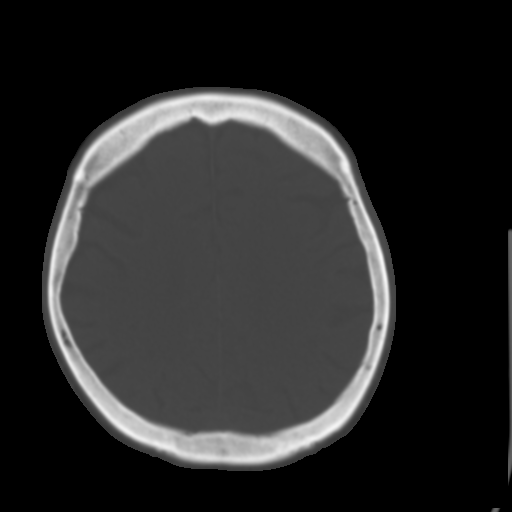
[im 25/33  brain]
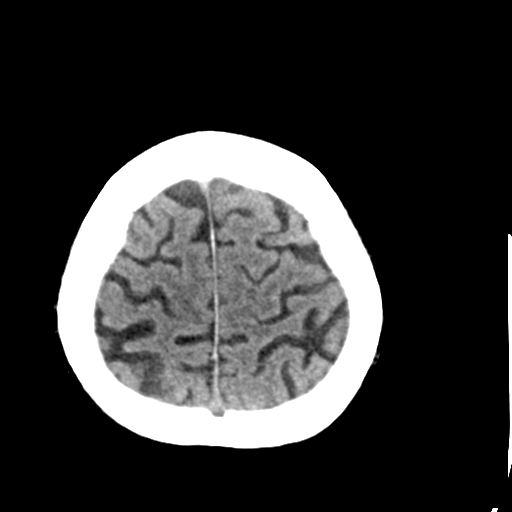
[im 29/33  brain]
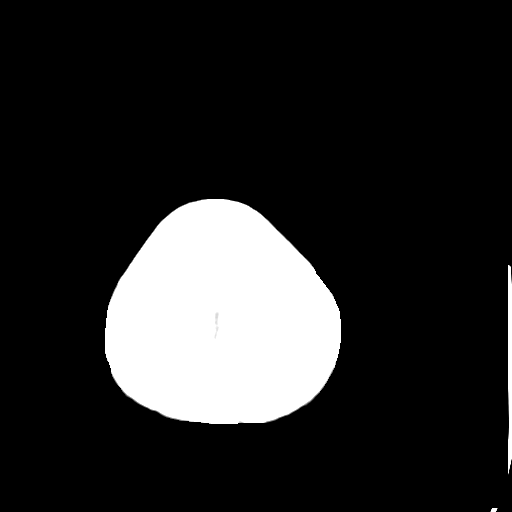

[Series 4: head bone · axial · 0.41mm/px · z∈[+1454,+1510]mm · 4 of 83 slices shown]
[im 9/83  bone]
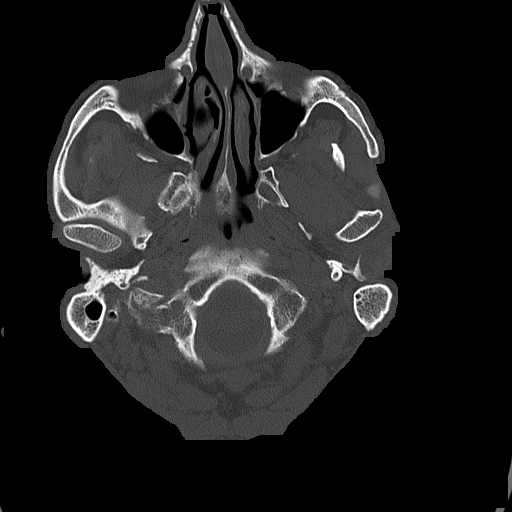
[im 17/83  bone]
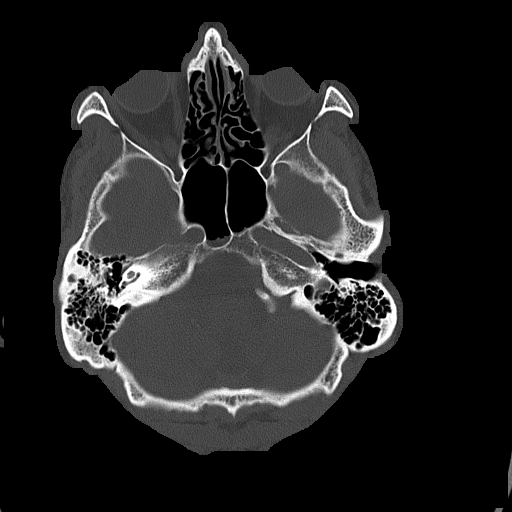
[im 25/83  bone]
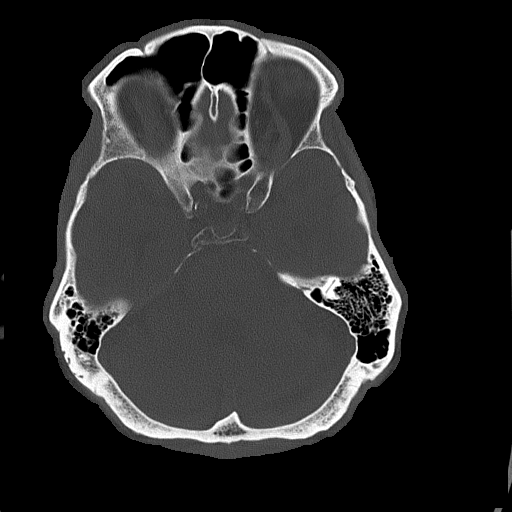
[im 37/83  bone]
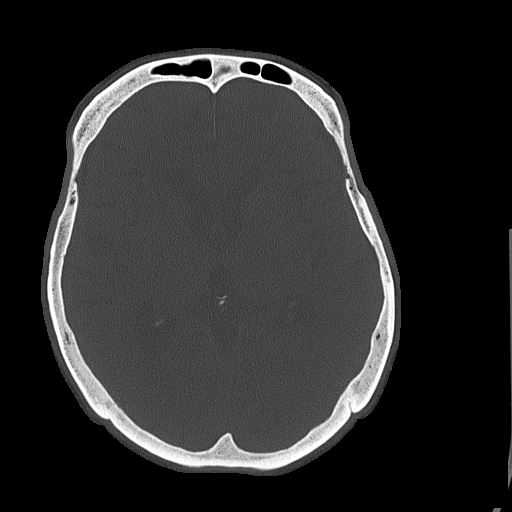

[Series 5: head without cor · coronal · non-contrast · 0.32mm/px · 3 of 67 slices shown]
[im 23/67  brain]
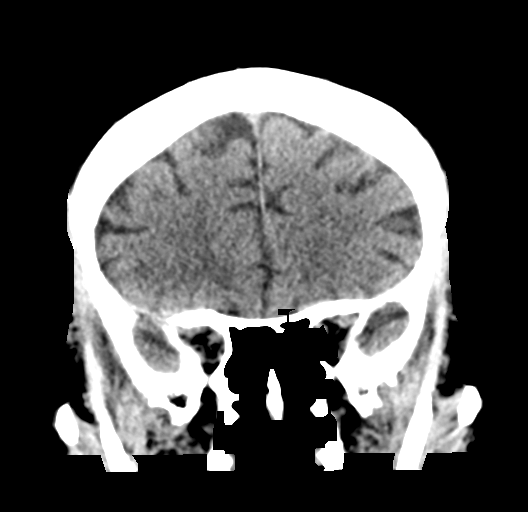
[im 30/67  brain]
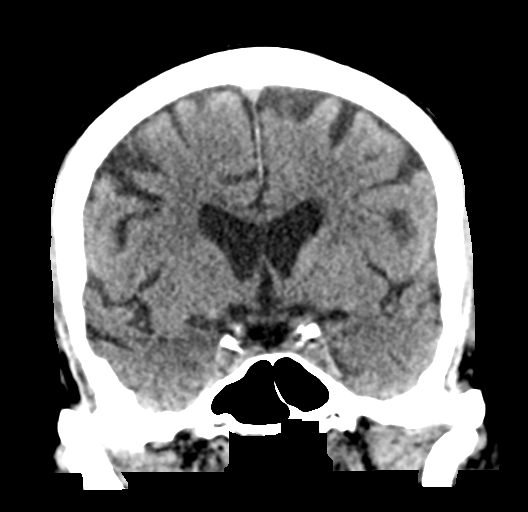
[im 37/67  brain]
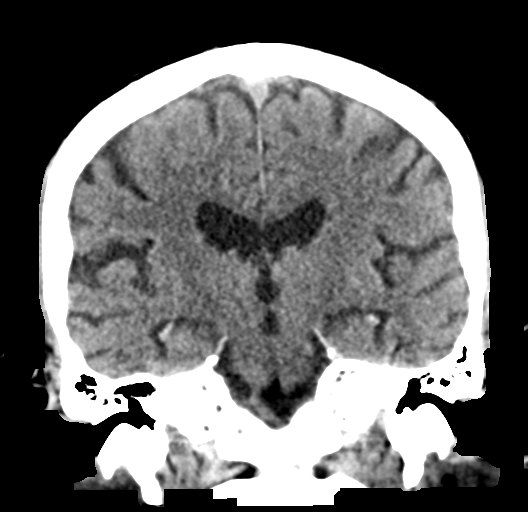

[Series 6: head without sag · sagittal · non-contrast · 0.33mm/px · 3 of 59 slices shown]
[im 20/59  brain]
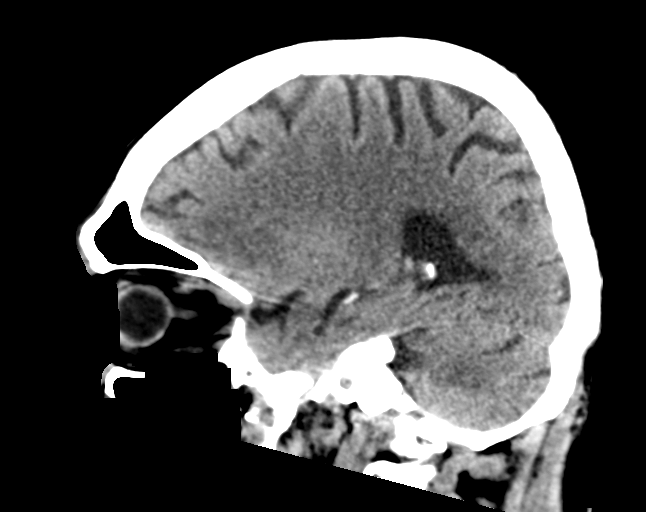
[im 30/59  brain]
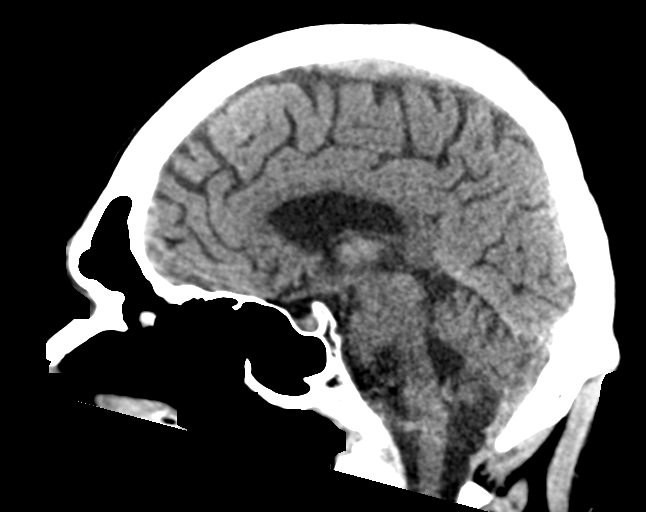
[im 39/59  brain]
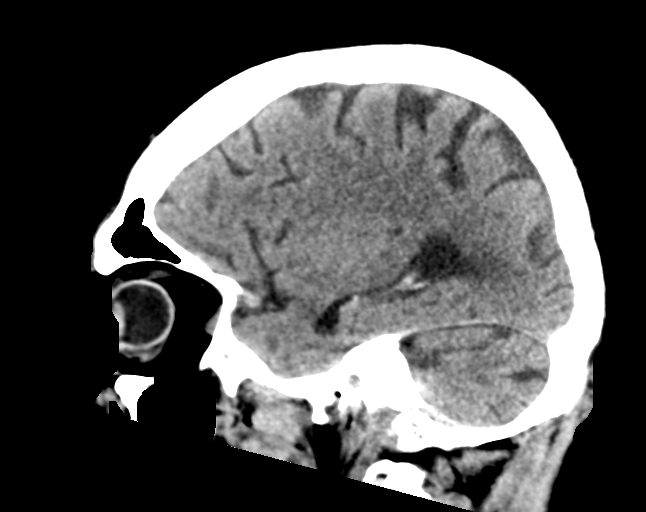

[17 of 47 positions shown; findings below may reference images not displayed]

FINDINGS: Brain: No intracranial hemorrhage, mass effect, or midline shift.
Unchanged degree of atrophy and chronic small vessel ischemia. No
hydrocephalus. The basilar cisterns are patent. No evidence of
territorial infarct or acute ischemia. No extra-axial or
intracranial fluid collection.

Vascular: Atherosclerosis of skullbase vasculature without
hyperdense vessel or abnormal calcification.

Skull: No fracture or focal lesion.

Sinuses/Orbits: Paranasal sinuses and mastoid air cells are clear.
The visualized orbits are unremarkable.

Other: None.
IMPRESSION: 1. No acute intracranial abnormality.
2. Unchanged atrophy and chronic small vessel ischemia.
